# Patient Record
Sex: Male | Born: 1955
Health system: Southern US, Community
[De-identification: ages and names within clinical notes are randomized; demographics above are authoritative.]

## PROBLEM LIST (undated history)

## (undated) DIAGNOSIS — L0390 Cellulitis, unspecified: Secondary | ICD-10-CM

## (undated) DIAGNOSIS — I509 Heart failure, unspecified: Secondary | ICD-10-CM

## (undated) DIAGNOSIS — J449 Chronic obstructive pulmonary disease, unspecified: Secondary | ICD-10-CM

## (undated) DIAGNOSIS — D689 Coagulation defect, unspecified: Secondary | ICD-10-CM

## (undated) DIAGNOSIS — N280 Ischemia and infarction of kidney: Secondary | ICD-10-CM

## (undated) DIAGNOSIS — N189 Chronic kidney disease, unspecified: Secondary | ICD-10-CM

## (undated) DIAGNOSIS — H9190 Unspecified hearing loss, unspecified ear: Secondary | ICD-10-CM

## (undated) DIAGNOSIS — E7800 Pure hypercholesterolemia, unspecified: Secondary | ICD-10-CM

## (undated) DIAGNOSIS — Q231 Congenital insufficiency of aortic valve: Secondary | ICD-10-CM

## (undated) DIAGNOSIS — Q2381 Bicuspid aortic valve: Secondary | ICD-10-CM

## (undated) DIAGNOSIS — I729 Aneurysm of unspecified site: Secondary | ICD-10-CM

## (undated) DIAGNOSIS — I5022 Chronic systolic (congestive) heart failure: Secondary | ICD-10-CM

## (undated) DIAGNOSIS — G459 Transient cerebral ischemic attack, unspecified: Secondary | ICD-10-CM

## (undated) DIAGNOSIS — N183 Chronic kidney disease, stage 3 unspecified: Secondary | ICD-10-CM

## (undated) DIAGNOSIS — I10 Essential (primary) hypertension: Secondary | ICD-10-CM

## (undated) DIAGNOSIS — I639 Cerebral infarction, unspecified: Secondary | ICD-10-CM

## (undated) DIAGNOSIS — Z952 Presence of prosthetic heart valve: Secondary | ICD-10-CM

## (undated) HISTORY — DX: Chronic kidney disease, unspecified: N18.9

## (undated) HISTORY — DX: Chronic systolic (congestive) heart failure: I50.22

## (undated) HISTORY — PX: PACEMAKER IMPLANT: EP1218

## (undated) HISTORY — DX: Congenital insufficiency of aortic valve: Q23.1

## (undated) HISTORY — DX: Ischemia and infarction of kidney: N28.0

## (undated) HISTORY — PX: AORTIC VALVE REPLACEMENT: SHX41

## (undated) HISTORY — PX: AORTIC VALVE SURGERY: SHX549

## (undated) HISTORY — DX: Bicuspid aortic valve: Q23.81

## (undated) HISTORY — DX: Transient cerebral ischemic attack, unspecified: G45.9

## (undated) HISTORY — DX: Coagulation defect, unspecified: D68.9

## (undated) HISTORY — DX: Cerebral infarction, unspecified: I63.9

## (undated) HISTORY — DX: Cellulitis, unspecified: L03.90

---
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
## 1997-10-22 ENCOUNTER — Emergency Department (HOSPITAL_COMMUNITY): Admission: EM | Admit: 1997-10-22 | Discharge: 1997-10-22 | Payer: Self-pay | Admitting: Emergency Medicine

## 2003-07-04 HISTORY — PX: CARPAL TUNNEL RELEASE: SHX101

## 2007-07-04 HISTORY — PX: CARDIAC SURGERY: SHX584

## 2010-03-21 HISTORY — PX: CARDIAC CATHETERIZATION: SHX172

## 2010-03-25 DIAGNOSIS — Z952 Presence of prosthetic heart valve: Secondary | ICD-10-CM

## 2010-03-25 HISTORY — DX: Presence of prosthetic heart valve: Z95.2

## 2011-07-04 ENCOUNTER — Emergency Department (HOSPITAL_BASED_OUTPATIENT_CLINIC_OR_DEPARTMENT_OTHER)
Admission: EM | Admit: 2011-07-04 | Discharge: 2011-07-04 | Disposition: A | Discharge status: Home / Self Care | Payer: Self-pay | Attending: Emergency Medicine | Admitting: Emergency Medicine

## 2011-07-04 ENCOUNTER — Emergency Department (INDEPENDENT_AMBULATORY_CARE_PROVIDER_SITE_OTHER): Payer: Self-pay

## 2011-07-04 ENCOUNTER — Encounter: Payer: Self-pay | Admitting: *Deleted

## 2011-07-04 DIAGNOSIS — R05 Cough: Secondary | ICD-10-CM | POA: Insufficient documentation

## 2011-07-04 DIAGNOSIS — R509 Fever, unspecified: Secondary | ICD-10-CM | POA: Insufficient documentation

## 2011-07-04 DIAGNOSIS — Z79899 Other long term (current) drug therapy: Secondary | ICD-10-CM | POA: Insufficient documentation

## 2011-07-04 DIAGNOSIS — E7800 Pure hypercholesterolemia, unspecified: Secondary | ICD-10-CM | POA: Insufficient documentation

## 2011-07-04 DIAGNOSIS — R0989 Other specified symptoms and signs involving the circulatory and respiratory systems: Secondary | ICD-10-CM

## 2011-07-04 DIAGNOSIS — I10 Essential (primary) hypertension: Secondary | ICD-10-CM | POA: Insufficient documentation

## 2011-07-04 DIAGNOSIS — R059 Cough, unspecified: Secondary | ICD-10-CM | POA: Insufficient documentation

## 2011-07-04 DIAGNOSIS — J029 Acute pharyngitis, unspecified: Secondary | ICD-10-CM | POA: Insufficient documentation

## 2011-07-04 HISTORY — DX: Pure hypercholesterolemia, unspecified: E78.00

## 2011-07-04 MED ORDER — OXYCODONE-ACETAMINOPHEN 5-325 MG PO TABS
1.0000 | ORAL_TABLET | Freq: Once | ORAL | Status: AC
  Administered 2011-07-04: 1 via ORAL
  Filled 2011-07-04: qty 1

## 2011-07-04 MED ORDER — OXYCODONE-ACETAMINOPHEN 5-325 MG PO TABS: 1.0000 | ORAL_TABLET | ORAL | Status: AC | PRN

## 2011-07-04 MED ORDER — PENICILLIN V POTASSIUM 500 MG PO TABS: 500.0000 mg | ORAL_TABLET | Freq: Three times a day (TID) | ORAL | Status: AC

## 2011-07-04 NOTE — ED Notes (Signed)
Pt states saw cardiologist yesterday for recheck and got good report coumadin level within normal limits. Started getting cough chest congestion and severe sore throat with chills and fever yesterday.

## 2011-07-04 NOTE — ED Provider Notes (Signed)
History     CSN: [id number]  Arrival date & time 07/04/11  4098   First MD Initiated Contact with Patient 07/04/11 1027      Chief Complaint  Patient presents with  . Cough  . Fever  . Sore Throat    (Consider location/radiation/quality/duration/timing/severity/associated sxs/prior treatment) HPI  Ho aortic valve replacement on Coumadin, hypertension presents with multiple complaints. Patient states that yesterday he can contact with sick person on elevated. He states that shortly thereafter he began to experience 9 or 10 pain/sore throat. He is having nasal congestion and rhinorrhea. He is not having shortness of breath, chest pain. There is no neck swelling or tongue swelling. He is tolerating oral intake of fluid and solids but with pain. Denies rash. Subjective fever and chills.   ED Notes, ED Provider Notes from 07/04/11 0000 to 07/04/11 09:49:46       Melissa Ramer Lottie Rater, RN 07/04/2011 09:44      Pt states saw cardiologist yesterday for recheck and got good report coumadin level within normal limits. Started getting cough chest congestion and severe sore throat with chills and fever yesterday.     Past Medical History  Diagnosis Date  . Hypertension   . Hypercholesterolemia     Past Surgical History  Procedure Date  . Cardiac surgery     History reviewed. No pertinent family history.  History  Substance Use Topics  . Smoking status: Current Everyday Smoker  . Smokeless tobacco: Not on file  . Alcohol Use: No      Review of Systems  All other systems reviewed and are negative.   except as noted HPI  Allergies  Review of patient's allergies indicates no known allergies.  Home Medications   Current Outpatient Rx  Name Route Sig Dispense Refill  . ASPIRIN 81 MG PO CHEW Oral Chew 81 mg by mouth daily.      . FUROSEMIDE 40 MG PO TABS Oral Take 40 mg by mouth daily.      Marland Kitchen LISINOPRIL 10 MG PO TABS Oral Take 10 mg by mouth daily.      Marland Kitchen METOPROLOL  SUCCINATE ER 100 MG PO TB24 Oral Take 100 mg by mouth daily.      Marland Kitchen SIMVASTATIN 40 MG PO TABS Oral Take 40 mg by mouth at bedtime.      . WARFARIN SODIUM 1 MG PO TABS Oral Take by mouth as directed.      . OXYCODONE-ACETAMINOPHEN 5-325 MG PO TABS Oral Take 1 tablet by mouth every 4 (four) hours as needed for pain. 10 tablet 0  . PENICILLIN V POTASSIUM 500 MG PO TABS Oral Take 1 tablet (500 mg total) by mouth 3 (three) times daily. 30 tablet 0    BP 115/84  Pulse 68  Temp 99.3 F (37.4 C)  Resp 20  SpO2 100%  Physical Exam  Nursing note and vitals reviewed. Constitutional: He is oriented to person, place, and time. He appears well-developed and well-nourished. No distress.  HENT:  Head: Atraumatic.  Mouth/Throat: Oropharyngeal exudate present.       Posterior OP erythema and redness +exudates 2+ tonsillar swelling. Uvula midline No muffled voice or trismus  Eyes: Conjunctivae are normal. Pupils are equal, round, and reactive to light.  Neck: Neck supple.  Cardiovascular: Normal rate, regular rhythm, normal heart sounds and intact distal pulses.  Exam reveals no gallop and no friction rub.   No murmur heard. Pulmonary/Chest: Effort normal. No respiratory distress. He has no wheezes. He  has no rales.  Abdominal: Soft. Bowel sounds are normal. There is no tenderness. There is no rebound and no guarding.  Musculoskeletal: Normal range of motion. He exhibits no edema and no tenderness.  Neurological: He is alert and oriented to person, place, and time.  Skin: Skin is warm and dry.  Psychiatric: He has a normal mood and affect.    ED Course  Procedures (including critical care time)   Labs Reviewed  RAPID STREP SCREEN  STREP A DNA PROBE   Dg Chest 2 View  07/04/2011  *RADIOLOGY REPORT*  Clinical Data: Cough, congestion and fever.  CHEST - 2 VIEW  Comparison: None.  Findings: The heart size is normal status post median sternotomy and aortic valve replacement.  The lungs are clear.   There is no pleural effusion or pneumothorax.  No acute osseous findings are identified.  Telemetry leads overlie the chest.  IMPRESSION: No active cardiopulmonary process.  Original Report Authenticated By: Gerrianne Scale, M.D.     1. Pharyngitis     MDM  Exudative pharyngitis. His rapid strep is negative. Given the 90% sensitivity in the patient with history of aortic valve replacement will cover with penicillin streptococcal pharyngitis. The sample was sent for culture as well. Pain control, followup with his primary care. Given strict precautions for return to the emergency department.        Forbes Cellar, MD 07/04/11 [phone]

## 2011-07-05 LAB — STREP A DNA PROBE

## 2014-04-29 LAB — CBC
HCT: 42.7 % (ref 40.0–52.0)
HGB: 14.2 g/dL (ref 13.0–18.0)
MCH: 29.5 pg (ref 26.0–34.0)
MCHC: 33.2 g/dL (ref 32.0–36.0)
MCV: 89 fL (ref 80–100)
PLATELETS: 257 10*3/uL (ref 150–440)
RBC: 4.81 10*6/uL (ref 4.40–5.90)
RDW: 13.9 % (ref 11.5–14.5)
WBC: 15.2 10*3/uL — AB (ref 3.8–10.6)

## 2014-04-29 LAB — BASIC METABOLIC PANEL
Anion Gap: 7 (ref 7–16)
BUN: 13 mg/dL (ref 7–18)
CO2: 26 mmol/L (ref 21–32)
Calcium, Total: 8.7 mg/dL (ref 8.5–10.1)
Chloride: 106 mmol/L (ref 98–107)
Creatinine: 0.94 mg/dL (ref 0.60–1.30)
GLUCOSE: 112 mg/dL — AB (ref 65–99)
Osmolality: 278 (ref 275–301)
POTASSIUM: 3.9 mmol/L (ref 3.5–5.1)
SODIUM: 139 mmol/L (ref 136–145)

## 2014-04-29 LAB — PROTIME-INR
INR: 1.5
Prothrombin Time: 18.1 secs — ABNORMAL HIGH (ref 11.5–14.7)

## 2014-04-30 ENCOUNTER — Inpatient Hospital Stay: Payer: Self-pay | Admitting: Internal Medicine

## 2014-05-01 LAB — COMPREHENSIVE METABOLIC PANEL
ALBUMIN: 2.8 g/dL — AB (ref 3.4–5.0)
ALK PHOS: 59 U/L
Anion Gap: 6 — ABNORMAL LOW (ref 7–16)
BUN: 15 mg/dL (ref 7–18)
Bilirubin,Total: 0.5 mg/dL (ref 0.2–1.0)
CO2: 29 mmol/L (ref 21–32)
CREATININE: 0.92 mg/dL (ref 0.60–1.30)
Calcium, Total: 8.2 mg/dL — ABNORMAL LOW (ref 8.5–10.1)
Chloride: 104 mmol/L (ref 98–107)
EGFR (African American): >60
EGFR (Non-African Amer.): >60
GLUCOSE: 110 mg/dL — AB (ref 65–99)
OSMOLALITY: 279 (ref 275–301)
Potassium: 3.8 mmol/L (ref 3.5–5.1)
SGOT(AST): 18 U/L (ref 15–37)
SGPT (ALT): 18 U/L
SODIUM: 139 mmol/L (ref 136–145)
TOTAL PROTEIN: 7.6 g/dL (ref 6.4–8.2)

## 2014-05-01 LAB — CBC WITH DIFFERENTIAL/PLATELET
BASOS ABS: 0.1 10*3/uL (ref 0.0–0.1)
BASOS PCT: 0.6 %
EOS PCT: 2.4 %
Eosinophil #: 0.2 10*3/uL (ref 0.0–0.7)
HCT: 40.9 % (ref 40.0–52.0)
HGB: 13.9 g/dL (ref 13.0–18.0)
LYMPHS ABS: 1.6 10*3/uL (ref 1.0–3.6)
Lymphocyte %: 16.2 %
MCH: 30.4 pg (ref 26.0–34.0)
MCHC: 34.1 g/dL (ref 32.0–36.0)
MCV: 89 fL (ref 80–100)
MONO ABS: 1.2 x10 3/mm — AB (ref 0.2–1.0)
Monocyte %: 12.1 %
NEUTROS ABS: 6.7 10*3/uL — AB (ref 1.4–6.5)
NEUTROS PCT: 68.7 %
PLATELETS: 249 10*3/uL (ref 150–440)
RBC: 4.59 10*6/uL (ref 4.40–5.90)
RDW: 13.9 % (ref 11.5–14.5)
WBC: 9.8 10*3/uL (ref 3.8–10.6)

## 2014-05-01 LAB — HEMOGLOBIN A1C: HEMOGLOBIN A1C: 6.1 % (ref 4.2–6.3)

## 2014-05-01 LAB — PROTIME-INR
INR: 1.9
PROTHROMBIN TIME: 21.3 s — AB (ref 11.5–14.7)

## 2014-05-01 LAB — VANCOMYCIN, TROUGH: Vancomycin, Trough: 11 ug/mL (ref 10–20)

## 2014-05-02 LAB — PROTIME-INR
INR: 2.3
Prothrombin Time: 24.3 secs — ABNORMAL HIGH (ref 11.5–14.7)

## 2014-05-03 DIAGNOSIS — G459 Transient cerebral ischemic attack, unspecified: Secondary | ICD-10-CM

## 2014-05-03 HISTORY — DX: Transient cerebral ischemic attack, unspecified: G45.9

## 2014-05-03 LAB — PROTIME-INR
INR: 2.6
Prothrombin Time: 26.8 secs — ABNORMAL HIGH (ref 11.5–14.7)

## 2014-05-05 LAB — CULTURE, BLOOD (SINGLE)

## 2014-05-05 LAB — WOUND CULTURE

## 2014-05-24 ENCOUNTER — Observation Stay: Payer: Self-pay | Admitting: Internal Medicine

## 2014-05-24 DIAGNOSIS — R4781 Slurred speech: Secondary | ICD-10-CM | POA: Diagnosis not present

## 2014-05-24 DIAGNOSIS — L0390 Cellulitis, unspecified: Secondary | ICD-10-CM | POA: Diagnosis not present

## 2014-05-24 DIAGNOSIS — I361 Nonrheumatic tricuspid (valve) insufficiency: Secondary | ICD-10-CM

## 2014-05-24 DIAGNOSIS — E162 Hypoglycemia, unspecified: Secondary | ICD-10-CM | POA: Diagnosis not present

## 2014-05-24 LAB — CBC WITH DIFFERENTIAL/PLATELET
BASOS ABS: 0.1 10*3/uL (ref 0.0–0.1)
BASOS PCT: 0.6 %
EOS ABS: 0.3 10*3/uL (ref 0.0–0.7)
Eosinophil %: 2.8 %
HCT: 42.5 % (ref 40.0–52.0)
HGB: 14.1 g/dL (ref 13.0–18.0)
Lymphocyte #: 2.3 10*3/uL (ref 1.0–3.6)
Lymphocyte %: 21.2 %
MCH: 29.1 pg (ref 26.0–34.0)
MCHC: 33.1 g/dL (ref 32.0–36.0)
MCV: 88 fL (ref 80–100)
MONO ABS: 1.2 x10 3/mm — AB (ref 0.2–1.0)
Monocyte %: 10.8 %
Neutrophil #: 7.1 10*3/uL — ABNORMAL HIGH (ref 1.4–6.5)
Neutrophil %: 64.6 %
PLATELETS: 265 10*3/uL (ref 150–440)
RBC: 4.84 10*6/uL (ref 4.40–5.90)
RDW: 13.9 % (ref 11.5–14.5)
WBC: 11 10*3/uL — AB (ref 3.8–10.6)

## 2014-05-24 LAB — TROPONIN I: TROPONIN-I: 0.03 ng/mL

## 2014-05-24 LAB — COMPREHENSIVE METABOLIC PANEL
ALBUMIN: 3 g/dL — AB (ref 3.4–5.0)
ALT: 20 U/L
AST: 22 U/L (ref 15–37)
Alkaline Phosphatase: 69 U/L
Anion Gap: 8 (ref 7–16)
BUN: 14 mg/dL (ref 7–18)
Bilirubin,Total: 0.5 mg/dL (ref 0.2–1.0)
CO2: 27 mmol/L (ref 21–32)
CREATININE: 0.93 mg/dL (ref 0.60–1.30)
Calcium, Total: 8.5 mg/dL (ref 8.5–10.1)
Chloride: 105 mmol/L (ref 98–107)
EGFR (African American): >60
GLUCOSE: 86 mg/dL (ref 65–99)
OSMOLALITY: 279 (ref 275–301)
Potassium: 3.8 mmol/L (ref 3.5–5.1)
SODIUM: 140 mmol/L (ref 136–145)
TOTAL PROTEIN: 8.1 g/dL (ref 6.4–8.2)

## 2014-05-24 LAB — URINALYSIS, COMPLETE
BACTERIA: NONE SEEN
Bilirubin,UR: NEGATIVE
Blood: NEGATIVE
Glucose,UR: NEGATIVE mg/dL (ref 0–75)
Ketone: NEGATIVE
LEUKOCYTE ESTERASE: NEGATIVE
NITRITE: NEGATIVE
PH: 5 (ref 4.5–8.0)
PROTEIN: NEGATIVE
RBC,UR: NONE SEEN /HPF (ref 0–5)
SPECIFIC GRAVITY: 1.005 (ref 1.003–1.030)
SQUAMOUS EPITHELIAL: NONE SEEN
WBC UR: NONE SEEN /HPF (ref 0–5)

## 2014-05-24 LAB — APTT: ACTIVATED PTT: 35.9 s (ref 23.6–35.9)

## 2014-05-24 LAB — PROTIME-INR
INR: 1.9
PROTHROMBIN TIME: 21 s — AB (ref 11.5–14.7)

## 2014-05-24 LAB — URIC ACID: Uric Acid: 7 mg/dL (ref 3.5–7.2)

## 2014-05-25 LAB — BASIC METABOLIC PANEL
ANION GAP: 6 — AB (ref 7–16)
BUN: 15 mg/dL (ref 7–18)
CALCIUM: 8.5 mg/dL (ref 8.5–10.1)
CHLORIDE: 106 mmol/L (ref 98–107)
CO2: 28 mmol/L (ref 21–32)
CREATININE: 0.85 mg/dL (ref 0.60–1.30)
EGFR (African American): >60
Glucose: 101 mg/dL — ABNORMAL HIGH (ref 65–99)
Osmolality: 280 (ref 275–301)
POTASSIUM: 3.6 mmol/L (ref 3.5–5.1)
Sodium: 140 mmol/L (ref 136–145)

## 2014-05-25 LAB — CBC WITH DIFFERENTIAL/PLATELET
BASOS ABS: 0.1 10*3/uL (ref 0.0–0.1)
BASOS PCT: 0.9 %
EOS PCT: 4.1 %
Eosinophil #: 0.3 10*3/uL (ref 0.0–0.7)
HCT: 41.6 % (ref 40.0–52.0)
HGB: 13.5 g/dL (ref 13.0–18.0)
LYMPHS PCT: 24.5 %
Lymphocyte #: 2 10*3/uL (ref 1.0–3.6)
MCH: 28.5 pg (ref 26.0–34.0)
MCHC: 32.4 g/dL (ref 32.0–36.0)
MCV: 88 fL (ref 80–100)
Monocyte #: 1 x10 3/mm (ref 0.2–1.0)
Monocyte %: 11.8 %
Neutrophil #: 4.9 10*3/uL (ref 1.4–6.5)
Neutrophil %: 58.7 %
Platelet: 259 10*3/uL (ref 150–440)
RBC: 4.72 10*6/uL (ref 4.40–5.90)
RDW: 13.9 % (ref 11.5–14.5)
WBC: 8.3 10*3/uL (ref 3.8–10.6)

## 2014-05-25 LAB — PROTIME-INR
INR: 1.9
PROTHROMBIN TIME: 21.3 s — AB (ref 11.5–14.7)

## 2014-05-25 LAB — LIPID PANEL
CHOLESTEROL: 131 mg/dL (ref 0–200)
HDL: 30 mg/dL — AB (ref 40–60)
LDL CHOLESTEROL, CALC: 68 mg/dL (ref 0–100)
Triglycerides: 167 mg/dL (ref 0–200)
VLDL Cholesterol, Calc: 33 mg/dL (ref 5–40)

## 2014-05-25 LAB — TSH: Thyroid Stimulating Horm: 1.81 u[IU]/mL

## 2014-10-24 NOTE — Discharge Summary (Signed)
PATIENT NAME:  Roberto [name], Roberto [name] MR#:  [id number] DATE OF BIRTH:  01/14/1956  DATE OF ADMISSION:  05/24/2014 DATE OF DISCHARGE:  05/25/2014  ADMITTING PHYSICIAN: Dr. Dustin Flock.    DISCHARGING PHYSICIAN:  Dr. Gladstone Lighter.   PRIMARY CARE PHYSICIAN: Currently none.   [place]: None.   DISCHARGE DIAGNOSES:  1.  Transient ischemic contact with left-sided weakness which is resolved.  2.  Hypertension.  3.  Mechanic metallic aortic valve replacement surgery on Coumadin.  4.  Recent right foot cellulitis with residual foot pain.    DISCHARGE HOME MEDICATIONS:  1. Lisinopril 2.5 mg p.o. daily.  2. Simvastatin 40 mg p.o. at bedtime.  3. Aspirin 81 mg p.o. daily.  4. Metoprolol 25 mg p.o. b.i.d.  5. Lasix 20 mg p.o. daily.  6. Coumadin 10 mg p.o. daily.  7. Oxycodone 5 mg 1 tablet q. 6 hours p.r.n. for severe pain of the foot.   DISCHARGE DIET: Low-sodium diet.   DISCHARGE ACTIVITY: As tolerated.    FOLLOWUP INSTRUCTIONS:  1.  PCP follow-up in 1-2 weeks, please advise to give patient information on local physicians who can take Medicare.  2.  Coumadin clinic followup in 5 days. The patient goes to [facility] Coumadin clinic. 3.  Cardiology followup as prior scheduled with his own cardiologist.    LABORATORIES AND IMAGING STUDIES PRIOR TO DISCHARGE:  1.  WBC 8.3, hemoglobin 13.5 hematocrit 41.6, platelet count 259,000.    2.  Sodium 140, potassium 3.6, chloride 106, bicarbonate 28, BUN 15, creatinine 0.8, blood glucose 101, and calcium of 8.5.  3.  LDL 68, HDL 30, total cholesterol 131, triglycerides 167.  4.  TSH 1.8.  5.  INR is 1.9.  6.  Echo Doppler showing no evidence of any TIA or CVA source.  EF is 50%-55%, mild LVH noted.   7.  Ultrasound carotid Doppler showing no evidence of any hemodynamically significant stenosis.  8.  Chest x-ray showing no active disease, minimal bibasilar atelectasis and scarring, but no other acute disease.   BRIEF HOSPITAL  COURSE: Roberto [name] is a 59 year old male with past medical history significant for mechanical aortic valve replacement surgery, on Coumadin, hypertension, comes to the hospital secondary to transient episode of left-sided weakness.   1.  TIA. Possible TIA with transient left-sided weakness which resolved by the time he came to the hospital, could have been as a result of his INR being subtherapeutic while on mechanical aortic valve.  CT head did not show any acute findings at all. His Coumadin dose has been increased. He was monitored for neurologic checks in the hospital. Echo did not show any source of CVA or TIA at this time. Carotid Doppler did not show any stenosis.  He is being discharged home with followup INR in 3 days.  2.  Metallic aortic valve replacement surgery on Coumadin. INR is subtherapeutic, dose increased and follow up as an outpatient.  He is on aspirin, metoprolol, statin, and lisinopril and Lasix. Not sure if he has underlying CAD or not he, will follow up with his cardiologist as an outpatient.  3.  Recent cellulitis about a month ago for the right foot, swelling is slightly there, no tenderness or erythema present. Finished antibiotics. Oxycodone p.r.n. for pain. His course has been otherwise uneventful in the hospital.   DISCHARGE CONDITION: Stable.   DISCHARGE DISPOSITION: Home.   TIME SPENT ON DISCHARGE: 40 minutes.    ____________________________ Gladstone Lighter, MD rk:bu D: 05/25/2014 16:43:16 ET  T: 05/25/2014 17:20:29 ET JOB#: [id number]  cc: Gladstone Lighter, MD, <Dictator> Gladstone Lighter MD ELECTRONICALLY SIGNED 06/19/2014 14:01

## 2014-10-24 NOTE — H&P (Signed)
PATIENT NAME:  Roberto [name], Roberto [name] MR#:  [id number] DATE OF BIRTH:  [birth year]  DATE OF ADMISSION:  05/24/2014  PRIMARY CARE PROVIDER: None. He reports that he does not have any primary doctor.  EMERGENCY DEPARTMENT REFERRING PHYSICIAN: Dr. (Dictation Anomaly)    CHIEF COMPLAINT: Slurred speech, left upper extremity and left lower extremity weakness.   HISTORY OF PRESENT ILLNESS: The patient is a 59 year old, Caucasian male with medical history significant for aortic valve replacement with mechanical valve, who also continues to smoke, who was hospitalized here actually on 04/30/2014 with right lower extremity cellulitis, who continues to have issues with swelling in the legs, but the erythema has resolved, who earlier this morning around 12 p.m. developed left-sided weakness in the upper and lower extremity, as well as slurred speech. The patient was brought to the Emergency Room and had evaluation including a CT scan of the head, which showed mild cortical atrophy and no other abnormality was noted. The patient's symptoms have resolved. He is on chronic Coumadin and his INR is subtherapeutic at 1.9. The patient otherwise denies any fevers or chills. No chest pains. No palpitations. No shortness of breath. Complains of bilateral lower extremity swelling. He complains of pain on his left foot, as well. Denies any fevers or chills. No nausea, vomiting or diarrhea.   PAST MEDICAL HISTORY: 1. Significant for history of aortic valve replacement with mechanical valve that was done approximately 6 years ago, on chronic anticoagulation as a result.  2. Hyperlipidemia.  3. Hypertension, although the patient denies he has a high blood pressure, but he is on lisinopril and metoprolol. States that these are being used for his aortic valve replacement.   PAST SURGICAL HISTORY: Mechanical valve replacement done about 6 years ago in [place].   ALLERGIES: None.   MEDICATIONS: According to his last discharge,  oxycodone 5 mg 1 tablet p.o. q.4 h. p.r.n. Warfarin 7.5 mg 1 tablet followed by a half-tablet of 7.5, followed by another half-tablet, then he takes the 1 tablet of 7.5 mg, and he continues to repeat this. His anticoagulation is followed by the [facility]madin Clinic. Metoprolol 25 b.i.d., Lasix 20 daily, simvastatin 40 daily, lisinopril 2.5 p.o. daily, aspirin 81 mg 1 tablet p.o. daily.   SOCIAL HISTORY: He is a Administrator by profession. He continues to smoke about 2 packs per day. Denies alcohol or substance abuse.   FAMILY HISTORY: Significant for cancer in father, mother, brother and sister.   REVIEW OF SYSTEMS:  CONSTITUTIONAL: Denies any fevers, fatigue or weakness. No weight loss or weight gain.  EYES: No blurred or double vision. No redness. No inflammation. No glaucoma. No cataracts.  ENT: No tinnitus. No ear pain. No hearing loss. No allergies, seasonal or year-round allergies. No epistaxis. No nasal discharge. No difficulty swallowing.  RESPIRATORY: Denies any cough, wheezing, hemoptysis. No COPD, no TB.  CARDIOVASCULAR: Denies any chest pain or orthopnea. Complains of bilateral lower extremity edema. No arrhythmia.  GASTROINTESTINAL: No nausea, vomiting, diarrhea. No abdominal pain. No hematemesis. No melena. No ulcer. No guarding. No IBS. No jaundice.  GENITOURINARY: Denies any dysuria, hematuria, renal calculi or frequency.  ENDOCRINE: Denies any polyuria, nocturia, or thyroid problems. HEME AND LYMPHATIC: Denies anemia, easy bruisability or bleeding.  SKIN: No acne. No rash.  MUSCULOSKELETAL: Pain in the foot. No gout.  NEUROLOGIC: No numbness, CVA, TIA or seizures.  PSYCHIATRIC: No anxiety, insomnia or ADD.   PHYSICAL EXAMINATION:  VITAL SIGNS: Temperature 98.5, pulse 83, respirations 20, blood pressure 122/77, O2 of  98%.  GENERAL: The patient is an obese male, appears in no acute distress.  HEENT: Head atraumatic, normocephalic. Pupils equally round, reactive to light and  accommodation. There is no conjunctival pallor. No scleral icterus. Nasal examination shows no drainage or ulceration. Oropharynx is clear without any exudate.  NECK: Supple without any thyromegaly.  CARDIOVASCULAR: Regular rate and rhythm. No murmurs, rubs, clicks or gallops.  LUNGS: Clear to auscultation bilaterally without any rales, rhonchi or wheezing.  ABDOMEN: Soft, nontender, nondistended. Positive bowel sounds x 4.  EXTREMITIES: He has some swelling in both lower extremities, nonpitting edema, and there is no significant erythema or warmth.  LYMPH NODES: Nonpalpable.  VASCULAR: Good DP, PT pulses.  PSYCHIATRIC: Not anxious or depressed.  NEUROLOGIC: Awake, alert, and oriented x 3. No focal deficits. Cranial nerves II through XII grossly intact. Strength 5/5 in all 4 extremities. Reflexes 2+. Babinski is downgoing.  LABORATORY DATA: Evaluations in the Emergency Room, glucose 86, BUN 14, creatinine 0.93, sodium 140, potassium 3.8, chloride 105, CO2 of 27, calcium 8.5. LFTs are normal except albumin of 3.0. Troponin 0.03. WBC 11, hemoglobin 14.1, platelet count 265,000. INR 1.9.   Urinalysis: Nitrites negative, leukocytes negative.   CT scan of the head without contrast shows mild cortical atrophy, otherwise normal.   ASSESSMENT AND PLAN: The patient is a 59 year old, white male white male, with history of recent admission for cellulitis on chronic anticoagulation with warfarin for aortic valve replacement, who presents with brief episode of slurred speech, left-sided weakness, now resolved.   1. Transient ischemic attack with subtherapeutic INR. We will give him a Coumadin dose 9 mg tonight, follow his INR. We will continue aspirin, get an echocardiogram. We will place him on telemetry. Check carotid Dopplers.  2. Leg swelling. Differential diagnosis includes possible venous insufficiency, left arterial insufficiency. Also, I need to make sure he does not have underlying gout. We will check a uric  acid level. He will need an outpatient vascular evaluation to evaluate for circulation-related issues. 3. History of aortic valve replacement. INR should be in the 2.5 to 3.5 range. His Coumadin needs to be increased on discharge from his regular regimen.  4. Hypertension. Continue metoprolol and lisinopril. 5. Nicotine addiction. Smoking cessation done, 4 minutes spent. Strongly recommended that the patient stop smoking. I offered him nicotine. I also offered him other alternative oral therapies. The patient reports that he has tried all and they have not helped. He is going to try to quit on his own.   TIME SPENT: 55 minutes.     ____________________________ Lafonda Mosses Posey Pronto, MD shp:JT D: 05/24/2014 14:29:08 ET T: 05/24/2014 15:18:13 ET JOB#: [id number]  cc: Skyelar Halliday H. Posey Pronto, MD, <Dictator> Alric Seton MD ELECTRONICALLY SIGNED 05/30/2014 12:55

## 2014-10-24 NOTE — Discharge Summary (Signed)
PATIENT NAME:  Roberto [name], Roberto [name] MR#:  [id number] DATE OF BIRTH:  Apr 16, 1956  DATE OF ADMISSION:  04/30/2014 DATE OF DISCHARGE:  05/03/2014  PRESENTING COMPLAINT: Right lower extremity pain, swelling, and redness.   DISCHARGE DIAGNOSES: Right lower extremity cellulitis.   CODE STATUS: Full code.   MEDICATIONS: 1.  Augmentin 875 b.i.d.  2.  Oxycodone 5 mg 1 tablet every 4 hours as needed.  3.  Warfarin 7.5 mg p.o. daily.  4.  Metoprolol 25 mg b.i.d.  5.  Lasix 20 mg daily.  6.  Simvastatin 40 mg at bedtime.  7.  Lisinopril 2.5 mg daily.  8.  Aspirin 81 mg daily.   DISCHARGE INSTRUCTIONS: Follow up with your primary care physician at [facility]. Use crutches as instructed for support while walking. Your PT/INR needs to be checked by a PCP at least by Tuesday or Wednesday of next week.   LABORATORY DATA: Wound culture negative in 4 days. White count is 9.8, creatinine is 0.9. PT/INR is 21.3 and 1.9. PT/INR on discharge was 26.8 and 2.9. PT was 26.8 and 2.6. INR was 2.6 on discharge. Blood cultures negative in 5 days. Ultrasound of lower extremities shows no evidence of DVT on the right lower extremity.   BRIEF SUMMARY OF HOSPITAL COURSE: [name] is a 59 year old Caucasian gentleman with history of hypertension, hyperlipidemia, metallic aortic valve replacement on Coumadin with history of obesity comes in with:  1.  Right lower extremity cellulitis. He was on IV Zosyn, vancomycin, clinically improved, changed to p.o. Augmentin and redness improved. Still has some pain. P.r.n. oxycodone was prescribed. He remained afebrile. Blood cultures were negative. Doppler negative for DVT. Wound cultures remained negative.  2.  History of aortic valve replacement. INR therapeutic at discharge. He will follow up with his primary care physician for further PT/INR checks since he is being on antibiotics. This was advised to the patient and he did voice understanding.  3.  Hypertension. Continue home  medications.  4.  Hyperlipidemia. Continue simvastatin. The patient's vitals on discharge were 98.1, pulse is 91, blood pressure 122/77, saturations 94% on room air.   CODE STATUS: The patient remained a full code.   TIME SPENT: 40 minutes.    ____________________________ Hart Rochester Posey Pronto, MD sap:at D: 05/09/2014 16:39:36 ET T: 05/09/2014 17:33:16 ET JOB#: [id number]  cc: Fletcher Rathbun A. Posey Pronto, MD, <Dictator> Ilda Basset MD ELECTRONICALLY SIGNED 05/21/2014 11:48

## 2014-10-24 NOTE — H&P (Signed)
PATIENT NAME:  Roberto [name], Roberto [name] MR#:  [id number] DATE OF BIRTH:  1956-04-26  DATE OF ADMISSION:  04/30/2014  REFERRING PHYSICIAN:  Valli Glance. Owens Shark, MD  PRIMARY CARE PHYSICIAN:  Nonlocal in [place]  CARDIOLOGIST:  In [place], name not known  ADMITTING PHYSICIAN:  Juluis Mire, MD  CHIEF COMPLAINT:  Right lower extremity pain, swelling, and redness for past 6 days.   HISTORY OF PRESENT ILLNESS:  This is a 59 year old Caucasian male with a past medical history significant for aortic valve replacement with a mechanical valve about 6 years ago, on chronic anticoagulation, being followed at Coumadin clinic in [place], history of hyperlipidemia, who presents to the Emergency Room with the complaints of pain, redness, and swelling of the right lower extremity for the past 6 days. The patient states that he was in his usual state of health until 6 days ago, when he noticed swelling of the right lower extremity associated with some redness and pain, which gradually worsened, and the pain became so intense today, hence came to the Emergency Room for further evaluation. The patient denies any fever. No chills. No chest pain. No shortness of breath. No dizziness. No loss of consciousness. No nausea. No vomiting. No diarrhea. No dysuria. No hematuria. No prior similar episodes in the past. The patient was evaluated by the ED physician in the Emergency Room and was found to have extensive redness with cellulitis of the right lower extremity. Workup revealed elevated white blood cell count with normal vital signs and DVT studies of the right lower extremity negative for any acute DVT. He received some IV morphine for the pain control and was started on IV antibiotics Zosyn and vancomycin. Hospitalist service was consulted for further evaluation and management.   The patient states that he still has the right lower extremity pain, but it is getting better after he received morphine in the Emergency  Room. The patient was in [facility] before and moved to [place] about 2 years ago but did not get established with any local doctor. He is being followed by his cardiologist in [facility] every year and states he had seen his cardiologist last November and had an echocardiogram. He uses Coumadin and aspirin and is being followed by Coumadin clinic in [facility], and his last INR was 3.5 about 2-1/2 to 3 weeks ago per patient.   PAST MEDICAL HISTORY:  1.  Aortic valve replacement with a mechanical valve that was done about 6 years ago.  2.  Hyperlipidemia.  3.  Chronic anticoagulation, being followed by Coumadin clinic in [facility].   PAST SURGICAL HISTORY:  Mechanical aortic valve replacement done about 6 years ago in [place].   ALLERGIES:  No known drug allergies.   HOME MEDICATIONS: 1.  Warfarin 7.5 mg tablet half tablet 1 one day and half tablet on the second day followed by 1 tablet on the third day.  2.  Lisinopril, dose not known.  3.  Furosemide, dose not known.  4.  Simvastatin, dose not known.  5.  Metoprolol, dose not known.   SOCIAL HISTORY:  Moved to [place] about 2 years ago. He is a Administrator by profession. He is single. History of smoking about 2 packs per day for the past many years. Denies alcohol or substance abuse.   FAMILY HISTORY:  Significant for cancer with father, mother, brother, and sister.   REVIEW OF SYSTEMS: CONSTITUTIONAL: Negative for fever, fatigue, weakness, or excessive weight gain or weight  loss recently.  EYES:  Negative for blurred vision or double vision. No pain. No redness. No inflammation.  EARS, NOSE, AND THROAT:  Negative for tinnitus, ear pain, hearing loss, epistaxis, nasal discharge, or difficulty swallowing.  RESPIRATORY:  Negative for cough, wheezing, hemoptysis, dyspnea, or painful respirations.  CARDIOVASCULAR:  Negative for chest pain, palpitations, dizziness, syncope, orthopnea, or paroxysmal nocturnal dyspnea. He does have some  chronic pedal edema for which he takes furosemide on a regular basis.   GASTROINTESTINAL:  Negative for nausea, vomiting, diarrhea, abdominal pain, hematemesis, melena, GERD symptoms, or rectal bleeding.   GENITOURINARY:  Negative for dysuria, hematuria, frequency, or urgency.  ENDOCRINE:  Negative for polyuria or polydipsia. No heat or cold intolerance.  HEMATOLOGIC AND LYMPHATIC:  Negative for anemia or easy bruising or bleeding or swollen glands.   INTEGUMENTARY:  Significant for right lower extremity swelling, redness, and pain involving from the foot up to the thigh region. No arthritis.  NEUROLOGICAL:  Negative for focal weakness or numbness. No history of CVA, TIA, or seizure disorder.  PSYCHIATRIC:  Negative for anxiety, depression, or sleep disorder.   PHYSICAL EXAMINATION: VITAL SIGNS:  Temperature 98.6, pulse rate 87 per minute, respirations 22 per minute on arrival and now 18 per minute, blood pressure 129/63, oxygen saturation 96% on room air.  GENERAL:  Well-developed, well-nourished, obese male, pleasant and cooperative, alert and in no acute distress. He states pain has improving control with morphine.  HEAD:  Atraumatic, normocephalic.  EYES:  Pupils are equally reactive to light and accommodation. No conjunctival pallor. No scleral icterus. Extraocular movements are intact.  NOSE:  No nasal lesions. No drainage.  EARS:  No drainage. No external lesions.  ORAL CAVITY:  No mucosal lesions. No exudates.  NECK:  Supple. No JVD. No thyromegaly. No carotid bruit. Range of motion is normal.  RESPIRATORY:  Good respiratory effort. Not using any accessory muscles of respiration. Bilateral vesicular breath sounds present. No rales or rhonchi present.  CARDIOVASCULAR:  S1 and S2, regular. No murmurs. Click heard in the aortic area. Pulses:  Carotid and femoral are normal. Dorsalis pedis pulses on the right lower extremity not palpable, but Doppler is positive. He does have some peripheral  edema bilaterally, 1+ on the left lower extremity, and the right lower extremity is swollen with redness and tenderness.  GASTROINTESTINAL:  Abdomen is soft, obese, and nontender. No guarding. No rigidity. No hepatosplenomegaly. Bowel sounds present and equal in all 4 quadrants.  GENITOURINARY:  Deferred.  MUSCULOSKELETAL:  Range of motion and strength and tone equal bilaterally.  SKIN:  Redness with swelling and tenderness of the right lower extremity starting from the right foot up to the right thigh. Pedal edema is present.  LYMPHATIC:  Negative for cervical lymphadenopathy.  VASCULAR:  Poor dorsalis pedis pulses on the right lower extremity because of edema.  NEUROLOGICAL:  Cranial nerves II through XII are grossly intact. DTRs are 2+ bilaterally and symmetrical. No focal deficit. Strength is 5/5 in all extremities.  PSYCHIATRIC:  Judgment and insight are adequate. Alert and oriented x 3. Memory and mood are within normal limits.   LABORATORY DATA:  Serum glucose 112, BUN 13, creatinine 0.94, serum sodium 139, potassium 3.9, chloride 106, bicarbonate 26, total calcium 8.7. WBC 15.2, hemoglobin 14.2, hematocrit 42.7, platelet count 257. Prothrombin time 18.1, INR 1.5.   IMAGING:  Ultrasound Doppler of the right lower extremity:  No evidence of deep vein thrombosis within the visualized veins of the right lower extremity.  ASSESSMENT AND PLAN:  This is a 59 year old Caucasian male with a past medical history significant for mechanical aortic valve replacement on chronic anticoagulation and history of hyperlipidemia, who presents to the Emergency Room with the complaints of increasing swelling, pain, and redness of the right lower extremity of 6 days' duration.    1.  Right lower extremity cellulitis. Plan:  Admit to med-surge floor. Blood cultures, IV antibiotics vancomycin and Zosyn, elevation of right lower extremity, pain control measures.  2.  Status post aortic valve replacement with  mechanical valve for aortic stenosis, which was done about 6 years ago. The patient is on chronic anticoagulation and being followed at Coumadin clinic in [facility]. INR is subtherapeutic. Plan:  Give warfarin 10 mg tonight, check protime in the morning, and adjust dose accordingly.  3.  Elevated blood sugars. No history of diabetes mellitus. Check A1c and follow up accordingly.  4.  Active smoker. Counseled to quit smoking. Offered nicotine replacement therapy. The patient is undecided at this time.  5.  Hyperlipidemia on simvastatin, dose not known. Plan:  Get home medications and start home medications.    CODE STATUS:  Full code.   TIME SPENT:  55 minutes.     ____________________________ Juluis Mire, MD enr:nb D: 04/30/2014 02:21:52 ET T: 04/30/2014 02:47:53 ET JOB#: [id number]  cc: Juluis Mire, MD, <Dictator> Unknown cc  Juluis Mire MD ELECTRONICALLY SIGNED 05/29/2014 19:49

## 2014-12-15 ENCOUNTER — Other Ambulatory Visit: Payer: Self-pay | Admitting: Unknown Physician Specialty

## 2014-12-16 NOTE — Telephone Encounter (Signed)
Routing to primary 

## 2014-12-18 ENCOUNTER — Ambulatory Visit (INDEPENDENT_AMBULATORY_CARE_PROVIDER_SITE_OTHER): Payer: Self-pay | Admitting: Unknown Physician Specialty

## 2014-12-18 ENCOUNTER — Encounter: Payer: Self-pay | Admitting: Unknown Physician Specialty

## 2014-12-18 VITALS — BP 126/78 | HR 64 | Temp 98.8°F | Ht 65.5 in | Wt 321.6 lb

## 2014-12-18 DIAGNOSIS — T8209XD Other mechanical complication of heart valve prosthesis, subsequent encounter: Secondary | ICD-10-CM

## 2014-12-18 DIAGNOSIS — F17200 Nicotine dependence, unspecified, uncomplicated: Secondary | ICD-10-CM | POA: Insufficient documentation

## 2014-12-18 LAB — COAGUCHEK XS/INR WAIVED
INR: 2.3 — AB (ref 0.9–1.1)
PROTHROMBIN TIME: 27.8 s

## 2014-12-18 NOTE — Patient Instructions (Signed)
Warfarin: What You Need to Know Warfarin is an anticoagulant. Anticoagulants help prevent the formation of blood clots. They also help stop the growth of blood clots. Warfarin is sometimes referred to as a "blood thinner."  Normally, when body tissues are cut or damaged, the blood clots in order to prevent blood loss. Sometimes clots form inside your blood vessels and obstruct the flow of blood through your circulatory system (thrombosis). These clots may travel through your bloodstream and become lodged in smaller blood vessels in your brain, which can cause a stroke, or in your lungs (pulmonary embolism). WHO SHOULD USE WARFARIN? Warfarin is prescribed for people at risk of developing harmful blood clots:  People with surgically implanted mechanical heart valves, irregular heart rhythms called atrial fibrillation, and certain clotting disorders.  People who have developed harmful blood clotting in the past, including those who have had a stroke or a pulmonary embolism, or thrombosis in their legs (deep vein thrombosis [DVT]).  People with an existing blood clot, such as a pulmonary embolism. WARFARIN DOSING Warfarin tablets come in different strengths. Each tablet strength is a different color, with the amount of warfarin (in milligrams) clearly printed on the tablet. If the color of your tablet is different than usual when you receive a new prescription, report it immediately to your pharmacist or health care provider. WARFARIN MONITORING The goal of warfarin therapy is to lessen the clotting tendency of blood but not prevent clotting completely. Your health care provider will monitor the anticoagulation effect of warfarin closely and adjust your dose as needed. For your safety, blood tests called prothrombin time (PT) or international normalized ratio (INR) are used to measure the effects of warfarin. Both of these tests can be done with a finger stick or a blood draw. The longer it takes the  blood to clot, the higher the PT or INR. Your health care provider will inform you of your "target" PT or INR range. If, at any time, your PT or INR is above the target range, there is a risk of bleeding. If your PT or INR is below the target range, there is a risk of clotting. Whether you are started on warfarin while you are in the hospital or in your health care provider's office, you will need to have your PT or INR checked within one week of starting the medicine. Initially, some people are asked to have their PT or INR checked as much as twice a week. Once you are on a stable maintenance dose, the PT or INR is checked less often, usually once every 2 to 4 weeks. The warfarin dose may be adjusted if the PT or INR is not within the target range. It is important to keep all laboratory and health care provider follow-up appointments. Not keeping appointments could result in a chronic or permanent injury, pain, or disability because warfarin is a medicine that requires close monitoring. WHAT ARE THE SIDE EFFECTS OF WARFARIN?  Too much warfarin can cause bleeding (hemorrhage) from any part of the body. This may include bleeding from the gums, blood in the urine, bloody or dark stools, a nosebleed that is not easily stopped, coughing up blood, or vomiting blood.  Too little warfarin can increase the risk of blood clots.  Too little or too much warfarin can also increase the risk of a stroke.  Warfarin use may cause a skin rash or irritation, an unusual fever, continual nausea or stomach upset, or severe pain in your joints or back.   SPECIAL PRECAUTIONS WHILE TAKING WARFARIN Warfarin should be taken exactly as directed. It is very important to take warfarin as directed since bleeding or blood clots could result in chronic or permanent injury, pain, or disability.  Take your medicine at the same time every day. If you forget to take your dose, you can take it if it is within 6 hours of when it was  due.  Do not change the dose of warfarin on your own to make up for missed or extra doses.  If you miss more than 2 doses in a row, you should contact your health care provider for advice. Avoid situations that cause bleeding. You may have a tendency to bleed more easily than usual while taking warfarin. The following actions can limit bleeding:  Using a softer toothbrush.  Flossing with waxed floss rather than unwaxed floss.  Shaving with an electric razor rather than a blade.  Limiting the use of sharp objects.  Avoiding potentially harmful activities, such as contact sports. Warfarin and Pregnancy or Breastfeeding  Warfarin is not advised during the first trimester of pregnancy due to an increased risk of birth defects. In certain situations, a woman may take warfarin after her first trimester of pregnancy. A woman who becomes pregnant or plans to become pregnant while taking warfarin should notify her health care provider immediately.  Although warfarin does not pass into breast milk, a woman who wishes to breastfeed while taking warfarin should also consult with her health care provider. Alcohol, Smoking, and Illicit Drug Use  Alcohol affects how warfarin works in the body. It is best to avoid alcoholic drinks or consume very small amounts while taking warfarin. In general, alcohol intake should be limited to 1 oz (30 mL) of liquor, 6 oz (180 mL) of wine, or 12 oz (360 mL) of beer each day. Notify your health care provider if you change your alcohol intake.  Smoking affects how warfarin works. It is best to avoid smoking while taking warfarin. Notify your health care provider if you change your smoking habits.  It is best to avoid all illicit drugs while taking warfarin since there are few studies that show how warfarin interacts with these drugs. Other Medicines and Dietary Supplements Many prescription and over-the-counter medicines can interfere with warfarin. Be sure all of your  health care providers know you are taking warfarin. Notify your health care provider who prescribed warfarin for you or your pharmacist before starting or stopping any new medicines, including over-the-counter vitamins, dietary supplements, and pain medicines. Your warfarin dose may need to be adjusted. Some common over-the-counter medicines that may increase the risk of bleeding while taking warfarin include:   Acetaminophen.  Aspirin.  Nonsteroidal anti-inflammatory medicines (NSAIDs), such as ibuprofen or naproxen.  Vitamin E. Dietary Considerations  Foods that have moderate or high amounts of vitamin K can interfere with warfarin. Avoid major changes in your diet or notify your health care provider before changing your diet. Eat a consistent amount of foods that have moderate or high amounts of vitamin K. Eating less foods containing vitamin K can increase the risk of bleeding. Eating more foods containing vitamin K can increase the risk of blood clots. Additional questions about dietary considerations can be discussed with a dietitian. Foods that are very high in vitamin K:  Greens, such as Swiss chard and beet, collard, mustard, or turnip greens (fresh or frozen, cooked).  Kale (fresh or frozen, cooked).  Parsley (raw).  Spinach (cooked). Foods that are high   in vitamin K:  Asparagus (frozen, cooked).  Beans, green (frozen, cooked).  Broccoli.  Bok choy (cooked).  Brussels sprouts (fresh or frozen, cooked).  Cabbage (cooked).   Coleslaw. Foods that are moderately high in vitamin K:  Blueberries.  Black-eyed peas.  Endive (raw).  Green leaf lettuce (raw).  Green scallions (raw).  Kale (raw).  Okra (frozen, cooked).  Plantains (fried).  Romaine lettuce (raw).  Sauerkraut (canned).  Spinach (raw). CALL YOUR CLINIC OR HEALTH CARE PROVIDER IF YOU:  Plan to have any surgery or procedure.  Feel sick, especially if you have diarrhea or  vomiting.  Experience or anticipate any major changes in your diet.  Start or stop a prescription or over-the-counter medicine.  Become, plan to become, or think you may be pregnant.  Are having heavier than usual menstrual periods.  Have had a fall, accident, or any symptoms of bleeding or unusual bruising.  Develop an unusual fever. CALL 911 IN THE [place] OR GO TO THE EMERGENCY DEPARTMENT IF YOU:   Think you may be having an allergic reaction to warfarin. The signs of an allergic reaction could include itching, rash, hives, swelling, chest tightness, or trouble breathing.  See signs of blood in your urine. The signs could include reddish, pinkish, or tea-colored urine.  See signs of blood in your stools. The signs could include bright red or black stools.  Vomit or cough up blood. In these instances, the blood could have either a bright red or a "coffee-grounds" appearance.  Have bleeding that will not stop after applying pressure for 30 minutes such as cuts, nosebleeds, or other injuries.  Have severe pain in your joints or back.  Have a new and severe headache.  Have sudden weakness or numbness of your face, arm, or leg, especially on one side of your body.  Have sudden confusion or trouble understanding.  Have sudden trouble seeing in one or both eyes.  Have sudden trouble walking, dizziness, loss of balance, or coordination.  Have trouble speaking or understanding (aphasia). Document Released: 06/19/2005 Document Revised: 11/03/2013 Document Reviewed: 12/13/2012 [facility] Patient Information 2015 [place], [place]. This information is not intended to replace advice given to you by your health care provider. Make sure you discuss any questions you have with your health care provider. Warfarin Coagulopathy Warfarin (Coumadin) coagulopathy refers to bleeding that may occur as a complication of the medicine warfarin. Warfarin is an oral blood thinner (anticoagulant). Warfarin  is used for medical conditions where thinning of the blood is needed to prevent blood clots.  CAUSES Bleeding is the most common and most serious complication of warfarin. The amount of bleeding is related to the warfarin dose and length of treatment. In addition, bleeding complications can also occur due to:  Intentional or accidental warfarin overdose.  Underlying medical conditions.  Dietary changes.  Medicine, herbal, supplement, or alcohol interactions. SYMPTOMS Severe bleeding while on warfarin may occur from any tissue or organ. Symptoms of the blood being too thin may include:  Bleeding from the nose or gums.  Blood in bowel movements which may appear as bright red, dark, or black tarry stools.  Blood in the urine which may appear as pink, red, or brown urine.  Unusual bruising or bruising easily.  A cut that does not stop bleeding within 10 minutes.  Vomiting blood or continuous nausea for more than 1 day.  Coughing up blood.  Broken blood vessels in your eye (subconjunctival hemorrhage).  Abdominal or back pain with or without flank  bruising.  Sudden, severe headache.  Sudden weakness or numbness of the face, arm, or leg, especially on one side of the body.  Sudden confusion.  Trouble speaking (aphasia) or understanding.  Sudden trouble seeing in one or both eyes.  Sudden trouble walking.  Dizziness.  Loss of balance or coordination.  Vaginal bleeding.  Swelling or pain at an injection site.  Superficial fat tissue death (necrosis) which may cause skin scarring. This is more common in women and may first present as pain in the waist, thighs, or buttocks.  Fever. HOME CARE INSTRUCTIONS  Always contact your health care provider of any concerns or signs of possible warfarin coagulopathy as soon as possible.  Take warfarin exactly as directed by your health care provider. It is recommended that you take your warfarin dose at the same time of the day.  If you have been told to stop taking warfarin, do not resume taking warfarin until directed to do so by your health care provider. Follow your health care provider's instructions if you accidentally take an extra dose or miss a dose of warfarin. It is very important to take warfarin as directed since bleeding or blood clots could result in chronic or permanent injury, pain, or disability.  Keep all follow-up appointments with your health care provider as directed. It is very important to keep your appointments. Not keeping appointments could result in a chronic or permanent injury, pain, or disability because warfarin is a medicine that requires close monitoring.  While taking warfarin, you will need to have regular blood tests to measure your blood clotting time. These blood tests usually include both the prothrombin time (PT) and International Normalized Ratio (INR) tests. The PT and INR results allow your health care provider to adjust your dose of warfarin. The dose can change for many reasons. It is critically important that you have your PT and INR levels drawn exactly as directed. Your warfarin dose may stay the same or change depending on what the PT and INR results are. Be sure to follow up with your health care provider regarding your PT and INR test results and what your warfarin dosage should be.  Many medicines can interfere with warfarin and affect the PT and INR results. You must tell your health care provider about any and all medicines you take. This includes all vitamins and supplements. Ask your health care provider before taking these. Prescription and over-the-counter medicine consistency is critical to warfarin management. It is important that potential interactions are checked before you start a new medicine. Be especially cautious with aspirin and anti-inflammatory medicines. Ask your health care provider before taking these. Medicines such as antibiotics and acid-reducing medicine can  interact with warfarin and can cause an increased warfarin effect. Warfarin can also interfere with the effectiveness of medicines you are taking. Do not take or discontinue any prescribed or over-the-counter medicine except on the advice of your health care provider or pharmacist.  Some vitamins, supplements, and herbal products interfere with the effectiveness of warfarin. Vitamin E may increase the anticoagulant effects of warfarin. Vitamin K can cause warfarin to be less effective. Do not take or discontinue any vitamin, supplement, or herbal product except on the advice of your health care provider or pharmacist.  Eat what you normally eat and keep the vitamin K content of your diet consistent. Avoid major changes in your diet, or notify your health care provider before changing your diet. Suddenly getting a lot more vitamin K could cause your  blood to clot too quickly. A sudden decrease in vitamin K intake could cause your blood to clot too slowly. These changes in vitamin K intake could lead to dangerous blood clotsor to bleeding. To keep your vitamin K intake consistent, you must be aware of which foods contain moderate or high amounts of vitamin K. Some foods high in vitamin K include spinach, kale, broccoli, cabbage, greens, Brussels sprouts, asparagus, bok choy, coleslaw, parsley, and green tea. Arrange a visit with a dietitian to answer your questions.  If you have a loss of appetite or get the stomach flu (viral gastroenteritis), talk to your health care provider as soon as possible. A decrease in your normal vitamin K intake can make you more sensitive to your usual dose of warfarin.  Some medical conditions may increase your risk for bleeding while you are taking warfarin. A fever, diarrhea lasting more than a day, worsening heart failure, or worsening liver function are some medical conditions that could affect warfarin. Contact your health care provider if you have any of these medical  conditions.  Be careful not to cut yourself when using sharp objects or while shaving.  Alcohol can change the body's ability to handle warfarin. It is best to avoid alcoholic drinks or consume only very small amounts while taking warfarin. Notify your health care provider if you change your alcohol intake. A sudden increase in alcohol use can increase your risk of bleeding. Chronic alcohol use can cause warfarin to be less effective.  Limit physical activities or sports that could result in a fall or cause injury.  Do not use warfarin if you are pregnant.  Inform all your health care providers and your dentist that you take warfarin.  Inform all health care providers if you are taking warfarin and aspirin or platelet inhibitor medicines such as clopidogrel, ticagrelor, or prasugrel. Use of these medicines in addition to warfarin can increase your risk of bleeding or death. Taking these medicines together should only be done under the direct care of your health care providers. SEEK IMMEDIATE MEDICAL CARE IF:  You cough up blood.  You have dark or black stools or there is bright red blood coming from your rectum.  You vomit blood or have nausea for more than 1 day.  You have blood in the urine or pink-colored urine.  You have unusual bruising or have increased bruising.  You have bleeding from the nose or gums that does not stop quickly.  You have a cut that does not stop bleeding within 2-3 minutes.  You have sudden weakness or numbness of the face, arm, or leg, especially on one side of the body.  You have sudden confusion.  You have trouble speaking (aphasia) or understanding.  You have sudden trouble seeing in one or both eyes.  You have sudden trouble walking.  You have dizziness.  You have a loss of balance or coordination.  You have a sudden, severe headache.  You have a serious fall or head injury, even if you are not bleeding.  You have swelling or pain at an  injection site.  You have unexplained tenderness or pain in the abdomen, back, waist, thighs, or buttocks.  You have a fever. Any of these symptoms may represent a serious problem that is an emergency. Do not wait to see if the symptoms will go away. Get medical help right away. Call your local emergency services (911 in [place] Do not drive yourself to the hospital. Document Released: 05/28/2006 Document  Revised: 11/03/2013 Document Reviewed: 11/28/2011 [facility] Patient Information 2015 [place], [place]. This information is not intended to replace advice given to you by your health care provider. Make sure you discuss any questions you have with your health care provider.

## 2014-12-18 NOTE — Progress Notes (Signed)
   BP 126/78 mmHg  Pulse 64  Temp(Src) 98.8 F (37.1 C) (Oral)  Ht 5' 5.5" (1.664 m)  Wt 321 lb 9.6 oz (145.877 kg)  BMI 52.68 kg/m2  SpO2 96%   Subjective:    Patient ID: Roberto [name], male    DOB: 1955-09-08, 59 y.o.   MRN: [id number]  CC: Coumadin management  HPI: This patient is a 59 y.o. male who presents for coumadin management. The expected duration of coumadin treatment is lifelong The reason for anticoagulation is  mechanical heart valve.  Present Coumadin dose: 7.5 mgs 1/,1/2, whole Goal: 2.5-3.5 The patient does not have an active anticoagulation episode. Excessive bruising: no Nose bleeding: no Rectal bleeding: no Prolonged menstrual cycles: N/A Eating diet with consistent amounts of foods containing Vitamin K:no Any recent antibiotic use? no   Wants to quit smoking and asking about Chantix  ROS: Per HPI unless specifically indicated above     Objective:    BP 126/78 mmHg  Pulse 64  Temp(Src) 98.8 F (37.1 C) (Oral)  Ht 5' 5.5" (1.664 m)  Wt 321 lb 9.6 oz (145.877 kg)  BMI 52.68 kg/m2  SpO2 96%  Wt Readings from Last 3 Encounters:  12/18/14 321 lb 9.6 oz (145.877 kg)     General: Well appearing, well nourished in no distress.  Normal mood and affect. Skin: No excessive bruising or rash  Current INR is 2.3.    Last CBC:  Lab Results  Component Value Date   WBC 8.3 05/25/2014   HGB 13.5 05/25/2014   HCT 41.6 05/25/2014   MCV 88 05/25/2014   PLT 259 05/25/2014        Assessment:     ICD-9-CM ICD-10-CM   1. 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 Tobacco dependence 305.1 F17.200     Plan:   Discussed current plan face-to-face with patient. For coumadin dosing, elected to continue current dose. Will plan to recheck INR in 2 weeks  Quit smoking information given.  Discussed that Chantix and Warfarin interacy.

## 2015-01-01 ENCOUNTER — Encounter: Payer: Self-pay | Admitting: Family Medicine

## 2015-01-01 ENCOUNTER — Ambulatory Visit (INDEPENDENT_AMBULATORY_CARE_PROVIDER_SITE_OTHER): Payer: Self-pay | Admitting: Family Medicine

## 2015-01-01 VITALS — BP 125/74 | HR 55 | Ht 64.6 in | Wt 318.0 lb

## 2015-01-01 DIAGNOSIS — Z954 Presence of other heart-valve replacement: Secondary | ICD-10-CM | POA: Diagnosis not present

## 2015-01-01 DIAGNOSIS — Z952 Presence of prosthetic heart valve: Secondary | ICD-10-CM

## 2015-01-01 LAB — COAGUCHEK XS/INR WAIVED
INR: 2 — AB (ref 0.9–1.1)
Prothrombin Time: 23.9 s

## 2015-01-01 MED ORDER — WARFARIN SODIUM 4 MG PO TABS: 4.0000 mg | ORAL_TABLET | Freq: Every day | ORAL | Status: DC

## 2015-01-01 NOTE — Progress Notes (Signed)
BP 125/74 mmHg  Pulse 55  Ht 5' 4.6" (1.641 m)  Wt 318 lb (144.244 kg)  BMI 53.56 kg/m2  SpO2 93%   Subjective:    Patient ID: Roberto [name], male    DOB: 1955-07-10, 59 y.o.   MRN: [id number]  CC: Coumadin management  HPI: This patient is a 59 y.o. male who presents for coumadin management. The expected duration of coumadin treatment is lifelong The reason for anticoagulation is  mechanical heart valve.  Present Coumadin dose: 3.75/7.5 alternating Goal: 2.5-3.5  Excessive bruising: no Nose bleeding: no Rectal bleeding: no Prolonged menstrual cycles: N/A Eating diet with consistent amounts of foods containing Vitamin K:no Any recent antibiotic use? no  ROS: Per HPI unless specifically indicated above     Objective:    BP 125/74 mmHg  Pulse 55  Ht 5' 4.6" (1.641 m)  Wt 318 lb (144.244 kg)  BMI 53.56 kg/m2  SpO2 93%  Wt Readings from Last 3 Encounters:  01/01/15 318 lb (144.244 kg)  11/17/14 319 lb (144.697 kg)  12/18/14 321 lb 9.6 oz (145.877 kg)     General: Well appearing, well nourished in no distress.  Normal mood and affect. Skin: No excessive bruising or rash  Last INR: 2.0    Last CBC:  Lab Results  Component Value Date   WBC 8.3 05/25/2014   HGB 13.5 05/25/2014   HCT 41.6 05/25/2014   MCV 88 05/25/2014   PLT 259 05/25/2014    Results for orders placed or performed in visit on 12/18/14  CoaguChek XS/INR Waived  Result Value Ref Range   INR 2.3 (H) 0.9 - 1.1   Prothrombin Time 27.8 sec       Assessment:     ICD-9-CM ICD-10-CM   1. Heart valve replaced V43.3 Z95.4 CoaguChek XS/INR Waived    Plan:   Discussed current plan face-to-face with patient. For coumadin dosing, elected to change dose to. Will plan to recheck INR in 1 week.

## 2015-01-07 ENCOUNTER — Other Ambulatory Visit: Payer: Self-pay | Admitting: Family Medicine

## 2015-01-07 DIAGNOSIS — Z952 Presence of prosthetic heart valve: Secondary | ICD-10-CM

## 2015-01-08 ENCOUNTER — Ambulatory Visit (INDEPENDENT_AMBULATORY_CARE_PROVIDER_SITE_OTHER): Payer: Self-pay | Admitting: Family Medicine

## 2015-01-08 ENCOUNTER — Encounter: Payer: Self-pay | Admitting: Family Medicine

## 2015-01-08 DIAGNOSIS — Z954 Presence of other heart-valve replacement: Secondary | ICD-10-CM

## 2015-01-08 DIAGNOSIS — Z952 Presence of prosthetic heart valve: Secondary | ICD-10-CM

## 2015-01-08 LAB — COAGUCHEK XS/INR WAIVED
INR: 2.5 — ABNORMAL HIGH (ref 0.9–1.1)
PROTHROMBIN TIME: 30 s

## 2015-01-08 NOTE — Patient Instructions (Signed)
Vitamin K Foods and Warfarin Warfarin is a medicine that helps prevent harmful blood clots by causing blood to clot more slowly. It does this by decreasing the activity of vitamin K, which promotes normal blood clotting. For the dose of warfarin you have been prescribed to work well, you need to get about the same amount of vitamin K from your food from day to day. Suddenly getting a lot more vitamin K could cause your blood to clot too quickly. A sudden decrease in vitamin K intake could cause your blood to clot too slowly. These changes in vitamin K intake could lead to dangerous blood clotsor to bleeding. WHAT GENERAL GUIDELINES DO I NEED TO FOLLOW?  Keep your intake of vitamin K consistent from day to day. To do this, you must be aware of which foods contain moderate or high amounts of vitamin K. Listed below are some foods that are very high, high, or moderately high in vitamin K. If you eat these foods, make sure you eat a consistent amount of them from day to day.  Avoid major changes in your diet, or tell your health care provider before changing your diet.  If you take a multivitamin that contains vitamin K, be sure to take it every day.  If you drink green tea, drink the same amount each day. WHAT FOODS ARE VERY HIGH IN VITAMIN K?   Greens, such as Swiss chard and beet, collard, mustard, or turnip greens (fresh or frozen, cooked).  Kale (fresh or frozen, cooked).   Parsley (raw).  Spinach (cooked).  WHAT FOODS ARE HIGH IN VITAMIN K?  Asparagus (frozen, cooked).  Beans, green (frozen, cooked).  Broccoli.   Bok choy (cooked).   Brussels sprouts (fresh or frozen, cooked).  Cabbage (cooked).  Coleslaw. WHAT FOODS ARE MODERATELY HIGH IN VITAMIN K?  Blueberries.  Black-eyed peas.  Endive (raw).   Green leaf lettuce (raw).   Green scallions (raw).  Kale (raw).  Okra (frozen, cooked).  Plantains (fried).  Romaine lettuce (raw).   Sauerkraut  (canned).   Spinach (raw). Document Released: 04/16/2009 Document Revised: 06/24/2013 Document Reviewed: 04/23/2013 [facility] Patient Information 2015 [place], [place]. This information is not intended to replace advice given to you by your health care provider. Make sure you discuss any questions you have with your health care provider.

## 2015-01-08 NOTE — Progress Notes (Signed)
   BP 131/76 mmHg  Pulse 75  Temp(Src) 98.7 F (37.1 C)  Ht 5' 5.6" (1.666 m)  Wt 319 lb 3.2 oz (144.788 kg)  BMI 52.17 kg/m2  SpO2 95%   Subjective:    Patient ID: Roberto [name], male    DOB: [birth year], 59 y.o.   MRN: [id number]  CC: Coumadin management  HPI: This patient is a 59 y.o. male who presents for coumadin management. The expected duration of coumadin treatment is lifelong The reason for anticoagulation is  mechanical heart valve.  Present Coumadin dose: 4mg  for 2 days, 7.5 for 1 day repeating Goal: 2.5-3.5  Excessive bruising: no Nose bleeding: no Rectal bleeding: no Prolonged menstrual cycles: N/A Eating diet with consistent amounts of foods containing Vitamin K:no Any recent antibiotic use? no  ROS: Per HPI unless specifically indicated above     Objective:    BP 131/76 mmHg  Pulse 75  Temp(Src) 98.7 F (37.1 C)  Ht 5' 5.6" (1.666 m)  Wt 319 lb 3.2 oz (144.788 kg)  BMI 52.17 kg/m2  SpO2 95%  Wt Readings from Last 3 Encounters:  01/08/15 319 lb 3.2 oz (144.788 kg)  01/01/15 318 lb (144.244 kg)  11/17/14 319 lb (144.697 kg)     General: Well appearing, well nourished in no distress.  Normal mood and affect. Skin: No excessive bruising or rash  Last INR: 2.5 Last PT: 30.0    Last CBC:  Lab Results  Component Value Date   WBC 8.3 05/25/2014   HGB 13.5 05/25/2014   HCT 41.6 05/25/2014   MCV 88 05/25/2014   PLT 259 05/25/2014    Results for orders placed or performed in visit on 01/01/15  CoaguChek XS/INR Waived  Result Value Ref Range   INR 2.0 (H) 0.9 - 1.1   Prothrombin Time 23.9 sec       Assessment:     ICD-9-CM ICD-10-CM   1. Mechanical heart valve present V43.3 Z95.4 CoaguChek XS/INR Waived    Plan:   Discussed current plan face-to-face with patient. For coumadin dosing, elected to continue current dose. Will plan to recheck INR in 2 weeks.

## 2015-01-19 ENCOUNTER — Other Ambulatory Visit: Payer: Self-pay | Admitting: Family Medicine

## 2015-01-19 DIAGNOSIS — Z952 Presence of prosthetic heart valve: Secondary | ICD-10-CM

## 2015-01-26 ENCOUNTER — Ambulatory Visit (INDEPENDENT_AMBULATORY_CARE_PROVIDER_SITE_OTHER): Payer: Self-pay | Admitting: Family Medicine

## 2015-01-26 ENCOUNTER — Encounter: Payer: Self-pay | Admitting: Family Medicine

## 2015-01-26 DIAGNOSIS — Z954 Presence of other heart-valve replacement: Secondary | ICD-10-CM

## 2015-01-26 DIAGNOSIS — Z952 Presence of prosthetic heart valve: Secondary | ICD-10-CM

## 2015-01-26 LAB — COAGUCHEK XS/INR WAIVED
INR: 2.9 — AB (ref 0.9–1.1)
Prothrombin Time: 34.7 s

## 2015-01-26 MED ORDER — WARFARIN SODIUM 4 MG PO TABS: 4.0000 mg | ORAL_TABLET | Freq: Every day | ORAL | Status: DC

## 2015-01-26 NOTE — Progress Notes (Signed)
HPI    BP 111/79 mmHg  Pulse 71  Temp(Src) 99 F (37.2 C)  Wt 319 lb 6.4 oz (144.879 kg)  SpO2 92%   Subjective:    Patient ID: Roberto [name], male    DOB: [birth year], 59 y.o.   MRN: [id number]  CC: Coumadin management  HPI: This patient is a 59 y.o. male who presents for coumadin management. The expected duration of coumadin treatment is lifelong The reason for anticoagulation is  mechanical heart valve.  Present Coumadin dose:7.5,4,4,7.5 Goal: 2.5-3.5 Excessive bruising: no Nose bleeding: no Rectal bleeding: no Prolonged menstrual cycles: N/A Eating diet with consistent amounts of foods containing Vitamin K:yes Any recent antibiotic use? no  ROS: Per HPI unless specifically indicated above     Objective:    BP 111/79 mmHg  Pulse 71  Temp(Src) 99 F (37.2 C)  Wt 319 lb 6.4 oz (144.879 kg)  SpO2 92%  Wt Readings from Last 3 Encounters:  01/26/15 319 lb 6.4 oz (144.879 kg)  01/08/15 319 lb 3.2 oz (144.788 kg)  01/01/15 318 lb (144.244 kg)     General: Well appearing, well nourished in no distress.  Normal mood and affect. Skin: No excessive bruising or rash  Last INR: 2.9 PT: 34.7    Last CBC:  Lab Results  Component Value Date   WBC 8.3 05/25/2014   HGB 13.5 05/25/2014   HCT 41.6 05/25/2014   MCV 88 05/25/2014   PLT 259 05/25/2014    Results for orders placed or performed in visit on 01/08/15  CoaguChek XS/INR Waived  Result Value Ref Range   INR 2.5 (H) 0.9 - 1.1   Prothrombin Time 30.0 sec       Assessment:     ICD-9-CM ICD-10-CM   1. Mechanical heart valve present V43.3 Z95.4 CoaguChek XS/INR Waived    Plan:   Discussed current plan face-to-face with patient. For coumadin dosing, elected to continue current dose. Will plan to recheck INR in 2 weeks.     Review of Systems     Physical Exam

## 2015-02-09 ENCOUNTER — Encounter: Payer: Self-pay | Admitting: Family Medicine

## 2015-02-09 ENCOUNTER — Ambulatory Visit (INDEPENDENT_AMBULATORY_CARE_PROVIDER_SITE_OTHER): Payer: Self-pay | Admitting: Family Medicine

## 2015-02-09 VITALS — BP 120/74 | HR 70 | Temp 98.5°F | Wt 318.8 lb

## 2015-02-09 DIAGNOSIS — I10 Essential (primary) hypertension: Secondary | ICD-10-CM

## 2015-02-09 DIAGNOSIS — E782 Mixed hyperlipidemia: Secondary | ICD-10-CM | POA: Insufficient documentation

## 2015-02-09 DIAGNOSIS — Z113 Encounter for screening for infections with a predominantly sexual mode of transmission: Secondary | ICD-10-CM

## 2015-02-09 DIAGNOSIS — E785 Hyperlipidemia, unspecified: Secondary | ICD-10-CM

## 2015-02-09 DIAGNOSIS — E1159 Type 2 diabetes mellitus with other circulatory complications: Secondary | ICD-10-CM | POA: Insufficient documentation

## 2015-02-09 DIAGNOSIS — Z954 Presence of other heart-valve replacement: Secondary | ICD-10-CM | POA: Diagnosis not present

## 2015-02-09 DIAGNOSIS — Z952 Presence of prosthetic heart valve: Secondary | ICD-10-CM | POA: Insufficient documentation

## 2015-02-09 LAB — LIPID PANEL PICCOLO, WAIVED
CHOL/HDL RATIO PICCOLO,WAIVE: 3.9 mg/dL
CHOLESTEROL PICCOLO, WAIVED: 161 mg/dL (ref <200)
HDL Chol Piccolo, Waived: 41 mg/dL — ABNORMAL LOW (ref >59)
LDL CHOL CALC PICCOLO WAIVED: 63 mg/dL (ref <100)
TRIGLYCERIDES PICCOLO,WAIVED: 284 mg/dL — AB (ref <150)
VLDL Chol Calc Piccolo,Waive: 57 mg/dL — ABNORMAL HIGH (ref <30)

## 2015-02-09 LAB — COAGUCHEK XS/INR WAIVED
INR: 2.5 — ABNORMAL HIGH (ref 0.9–1.1)
PROTHROMBIN TIME: 30.4 s

## 2015-02-09 MED ORDER — LISINOPRIL 10 MG PO TABS: 10.0000 mg | ORAL_TABLET | Freq: Every day | ORAL | Status: DC

## 2015-02-09 MED ORDER — METOPROLOL SUCCINATE ER 100 MG PO TB24: 100.0000 mg | ORAL_TABLET | Freq: Every day | ORAL | Status: DC

## 2015-02-09 MED ORDER — SIMVASTATIN 40 MG PO TABS: 40.0000 mg | ORAL_TABLET | Freq: Every day | ORAL | Status: DC

## 2015-02-09 NOTE — Assessment & Plan Note (Signed)
Under good control. Continue current regimen. Continue to monitor.  

## 2015-02-09 NOTE — Progress Notes (Signed)
BP 120/74 mmHg  Pulse 70  Temp(Src) 98.5 F (36.9 C)  Wt 318 lb 12.8 oz (144.607 kg)  SpO2 96%   Subjective:    Patient ID: Roberto [name], male    DOB: 08-30-1955, 58 y.o.   MRN: [id number]  HPI: Roberto [name] is a 59 y.o. male  Chief Complaint  Patient presents with  . Anticoagulation  . Hypertension  . Hyperlipidemia   HYPERTENSION / HYPERLIPIDEMIA Satisfied with current treatment? no Duration of hypertension: chronic BP monitoring frequency: not checking BP medication side effects: yes Duration of hyperlipidemia: chronic Cholesterol medication side effects: no Cholesterol supplements: none Medication compliance: excellent compliance Aspirin: yes Recent stressors: no Recurrent headaches: no Visual changes: no Palpitations: no Dyspnea: no Chest pain: no Lower extremity edema: no Dizzy/lightheaded: no  HPI: This patient is a 59 y.o. male who presents for coumadin management. The expected duration of coumadin treatment is lifelong The reason for anticoagulation is  mechanical heart valve.  Present Coumadin dose: 7.5, 4, 4, 7.5 Goal: 2.5-3.5  Excessive bruising: no Nose bleeding: no Rectal bleeding: no Prolonged menstrual cycles: N/A Eating diet with consistent amounts of foods containing Vitamin K:yes Any recent antibiotic use? no  Last INR: 2.5 PT: 30.4   Last CBC:  Lab Results  Component Value Date   WBC 8.3 05/25/2014   HGB 13.5 05/25/2014   HCT 41.6 05/25/2014   MCV 88 05/25/2014   PLT 259 05/25/2014     Relevant past medical, surgical, family and social history reviewed and updated as indicated. Interim medical history since our last visit reviewed. Allergies and medications reviewed and updated.  Review of Systems  Constitutional: Negative.   Respiratory: Negative.   Cardiovascular: Negative.   Gastrointestinal: Negative.   Psychiatric/Behavioral: Negative.     Per HPI unless specifically indicated above     Objective:    BP  120/74 mmHg  Pulse 70  Temp(Src) 98.5 F (36.9 C)  Wt 318 lb 12.8 oz (144.607 kg)  SpO2 96%  Wt Readings from Last 3 Encounters:  02/09/15 318 lb 12.8 oz (144.607 kg)  01/26/15 319 lb 6.4 oz (144.879 kg)  01/08/15 319 lb 3.2 oz (144.788 kg)    Physical Exam  Constitutional: He is oriented to person, place, and time. He appears well-developed and well-nourished. No distress.  HENT:  Head: Normocephalic and atraumatic.  Right Ear: Hearing normal.  Left Ear: Hearing normal.  Nose: Nose normal.  Eyes: Conjunctivae and lids are normal. Right eye exhibits no discharge. Left eye exhibits no discharge. No scleral icterus.  Pulmonary/Chest: Effort normal. No respiratory distress.  Musculoskeletal: Normal range of motion.  Neurological: He is alert and oriented to person, place, and time.  Skin: Skin is intact. No rash noted.  Psychiatric: He has a normal mood and affect. His speech is normal and behavior is normal. Judgment and thought content normal. Cognition and memory are normal.    Results for orders placed or performed in visit on 01/26/15  CoaguChek XS/INR Waived  Result Value Ref Range   INR 2.9 (H) 0.9 - 1.1   Prothrombin Time 34.7 sec      Assessment & Plan:   Problem List Items Addressed This Visit      Cardiovascular and Mediastinum   Mechanical heart valve present - Primary    Discussed current plan face-to-face with patient. For coumadin dosing, elected to continue current dose. Will plan to recheck INR in 1 month.      Relevant Medications   simvastatin (ZOCOR)  40 MG tablet   lisinopril (PRINIVIL,ZESTRIL) 10 MG tablet   metoprolol succinate (TOPROL-XL) 100 MG 24 hr tablet   Other Relevant Orders   CoaguChek XS/INR Waived (STAT)   Essential hypertension    Under good control today. Continue current regimen. Continue to monitor.       Relevant Medications   simvastatin (ZOCOR) 40 MG tablet   lisinopril (PRINIVIL,ZESTRIL) 10 MG tablet   metoprolol succinate  (TOPROL-XL) 100 MG 24 hr tablet   Other Relevant Orders   Comprehensive metabolic panel     Other   HLD (hyperlipidemia)    Under good control. Continue current regimen. Continue to monitor.       Relevant Medications   simvastatin (ZOCOR) 40 MG tablet   lisinopril (PRINIVIL,ZESTRIL) 10 MG tablet   metoprolol succinate (TOPROL-XL) 100 MG 24 hr tablet   Other Relevant Orders   Lipid Panel Piccolo, Waived    Other Visit Diagnoses    Routine screening for STI (sexually transmitted infection)        Chescking due to age. Drawn today.     Relevant Orders    Hepatitis C Antibody        Follow up plan: Return in about 4 weeks (around 03/09/2015) for INR check.

## 2015-02-09 NOTE — Assessment & Plan Note (Signed)
Under good control today. Continue current regimen. Continue to monitor.  

## 2015-02-09 NOTE — Assessment & Plan Note (Signed)
Discussed current plan face-to-face with patient. For coumadin dosing, elected to continue current dose. Will plan to recheck INR in 1 month. 

## 2015-02-10 ENCOUNTER — Encounter: Payer: Self-pay | Admitting: Family Medicine

## 2015-02-10 LAB — COMPREHENSIVE METABOLIC PANEL
ALBUMIN: 4 g/dL (ref 3.6–4.8)
ALT: 17 IU/L (ref 0–44)
AST: 14 IU/L (ref 0–40)
Albumin/Globulin Ratio: 1.5 (ref 1.1–2.5)
Alkaline Phosphatase: 47 IU/L (ref 39–117)
BILIRUBIN TOTAL: 0.3 mg/dL (ref 0.0–1.2)
BUN/Creatinine Ratio: 16 (ref 10–22)
BUN: 15 mg/dL (ref 8–27)
CALCIUM: 8.8 mg/dL (ref 8.6–10.2)
CO2: 24 mmol/L (ref 18–29)
Chloride: 102 mmol/L (ref 97–108)
Creatinine, Ser: 0.92 mg/dL (ref 0.76–1.27)
GFR calc non Af Amer: 90 mL/min/{1.73_m2} (ref >59)
GFR, EST AFRICAN AMERICAN: 104 mL/min/{1.73_m2} (ref >59)
Globulin, Total: 2.7 g/dL (ref 1.5–4.5)
Glucose: 116 mg/dL — ABNORMAL HIGH (ref 65–99)
Potassium: 4.7 mmol/L (ref 3.5–5.2)
Sodium: 141 mmol/L (ref 134–144)
TOTAL PROTEIN: 6.7 g/dL (ref 6.0–8.5)

## 2015-02-10 LAB — HEPATITIS C ANTIBODY: Hep C Virus Ab: 0.3 s/co ratio (ref 0.0–0.9)

## 2015-03-05 ENCOUNTER — Other Ambulatory Visit: Payer: Self-pay | Admitting: Family Medicine

## 2015-03-09 ENCOUNTER — Telehealth: Payer: Self-pay

## 2015-03-09 NOTE — Telephone Encounter (Signed)
I reviewed Programme researcher, broadcasting/film/video; he used to be on tartrate; I don't see anything that indicates an intentional switch to succinate I called pharmacy and spoke with staff there  His last regular correct Rx was metoprolol tartrate 25 mg BID from December 15, 2014; that came from Dr. Abran Richard, his cardiologist; then Dr. Wynetta Emery filled the 100 mg succinate but that appears to have been in error The patient established care here but still goes back to see the cardiologist every 6 months in [facility] according to that note I asked the pharmacist to please send the Rx request to his cardiologist and I'll correct our med list here; if any problems, call us back -------------------------- Faythe Ghee, [facility] staff, I cannot figure out how to enter a new medicine without making it look like I am prescribing it Please UPDATE the medicine list He is supposed to take metoprolol tartrate 25 mg one by mouth twice a day; that is prescribed by outside provider, Dr. Otho Perl

## 2015-03-09 NOTE — Telephone Encounter (Signed)
Patients friend called, he was given metoprolol succ this time instead of metoprolol tart which is a lot less expensive. Was this done on an accident or was it changed for a reason. Patient was unaware of a change.  [employer] will take back the succinate if a new script for tartrate is sent over

## 2015-03-09 NOTE — Telephone Encounter (Signed)
Unsure of what to do about this, patient is wanting a response

## 2015-03-09 NOTE — Telephone Encounter (Signed)
Medication updated in chart, unable to reach patient to notify him that it was sent to Dr.Folk for a refill authorization.

## 2015-03-09 NOTE — Telephone Encounter (Signed)
Please update the med list Pharmacy sent it to his cardiologist to get the refill

## 2015-03-15 ENCOUNTER — Ambulatory Visit (INDEPENDENT_AMBULATORY_CARE_PROVIDER_SITE_OTHER): Payer: Self-pay | Admitting: Family Medicine

## 2015-03-15 ENCOUNTER — Encounter: Payer: Self-pay | Admitting: Family Medicine

## 2015-03-15 VITALS — BP 127/78 | HR 66 | Temp 97.6°F | Wt 318.0 lb

## 2015-03-15 DIAGNOSIS — Z954 Presence of other heart-valve replacement: Secondary | ICD-10-CM | POA: Diagnosis not present

## 2015-03-15 DIAGNOSIS — Z952 Presence of prosthetic heart valve: Secondary | ICD-10-CM

## 2015-03-15 LAB — COAGUCHEK XS/INR WAIVED
INR: 2.8 — ABNORMAL HIGH (ref 0.9–1.1)
Prothrombin Time: 33.1 s

## 2015-03-15 NOTE — Progress Notes (Signed)
HPI   BP 127/78 mmHg  Pulse 66  Temp(Src) 97.6 F (36.4 C)  Wt 318 lb (144.244 kg)  SpO2 95%   Subjective:    Patient ID: Roberto [name], male    DOB: [birth year], 59 y.o.   MRN: [id number]  CC: Coumadin management  HPI: This patient is a 59 y.o. male who presents for coumadin management. The expected duration of coumadin treatment is lifelong The reason for anticoagulation is  mechanical heart valve.  Present Coumadin dose: Goal: 2.5-3.5  Excessive bruising: no Nose bleeding: no Rectal bleeding: no Prolonged menstrual cycles: N/A Eating diet with consistent amounts of foods containing Vitamin K:yes Any recent antibiotic use? no  ROS: Per HPI unless specifically indicated above     Objective:    BP 127/78 mmHg  Pulse 66  Temp(Src) 97.6 F (36.4 C)  Wt 318 lb (144.244 kg)  SpO2 95%  Wt Readings from Last 3 Encounters:  03/15/15 318 lb (144.244 kg)  02/09/15 318 lb 12.8 oz (144.607 kg)  01/26/15 319 lb 6.4 oz (144.879 kg)     General: Well appearing, well nourished in no distress.  Normal mood and affect. Skin: No excessive bruising or rash  Last INR: 2.8 PT: 33.1    Last CBC:  Lab Results  Component Value Date   WBC 8.3 05/25/2014   HGB 13.5 05/25/2014   HCT 41.6 05/25/2014   MCV 88 05/25/2014   PLT 259 05/25/2014    Results for orders placed or performed in visit on 02/09/15  CoaguChek XS/INR Waived (STAT)  Result Value Ref Range   INR 2.5 (H) 0.9 - 1.1   Prothrombin Time 30.4 sec  Comprehensive metabolic panel  Result Value Ref Range   Glucose 116 (H) 65 - 99 mg/dL   BUN 15 8 - 27 mg/dL   Creatinine, Ser 0.92 0.76 - 1.27 mg/dL   GFR calc non Af Amer 90 >59 mL/min/1.73   GFR calc Af Amer 104 >59 mL/min/1.73   BUN/Creatinine Ratio 16 10 - 22   Sodium 141 134 - 144 mmol/L   Potassium 4.7 3.5 - 5.2 mmol/L   Chloride 102 97 - 108 mmol/L   CO2 24 18 - 29 mmol/L   Calcium 8.8 8.6 - 10.2 mg/dL   Total Protein 6.7 6.0 - 8.5 g/dL   Albumin 4.0  3.6 - 4.8 g/dL   Globulin, Total 2.7 1.5 - 4.5 g/dL   Albumin/Globulin Ratio 1.5 1.1 - 2.5   Bilirubin Total 0.3 0.0 - 1.2 mg/dL   Alkaline Phosphatase 47 39 - 117 IU/L   AST 14 0 - 40 IU/L   ALT 17 0 - 44 IU/L  Lipid Panel Piccolo, Waived  Result Value Ref Range   Cholesterol Piccolo, Waived 161 <200 mg/dL   HDL Chol Piccolo, Waived 41 (L) >59 mg/dL   Triglycerides Piccolo,Waived 284 (H) <150 mg/dL   Chol/HDL Ratio Piccolo,Waive 3.9 mg/dL   LDL Chol Calc Piccolo Waived 63 <100 mg/dL   VLDL Chol Calc Piccolo,Waive 57 (H) <30 mg/dL  Hepatitis C antibody  Result Value Ref Range   Hep C Virus Ab 0.3 0.0 - 0.9 s/co ratio       Assessment:     ICD-9-CM ICD-10-CM   1. Heart valve replaced V43.3 Z95.4 CoaguChek XS/INR Waived    Plan:   Discussed current plan face-to-face with patient. For coumadin dosing, elected to continue current dose. Will plan to recheck INR in 1 month.    Review of Systems  Physical Exam

## 2015-04-12 ENCOUNTER — Other Ambulatory Visit: Payer: Self-pay | Admitting: Family Medicine

## 2015-04-12 DIAGNOSIS — Z952 Presence of prosthetic heart valve: Secondary | ICD-10-CM

## 2015-04-13 ENCOUNTER — Ambulatory Visit (INDEPENDENT_AMBULATORY_CARE_PROVIDER_SITE_OTHER): Payer: Self-pay | Admitting: Family Medicine

## 2015-04-13 ENCOUNTER — Ambulatory Visit: Payer: Self-pay | Admitting: Family Medicine

## 2015-04-13 ENCOUNTER — Encounter: Payer: Self-pay | Admitting: Family Medicine

## 2015-04-13 DIAGNOSIS — Z954 Presence of other heart-valve replacement: Secondary | ICD-10-CM

## 2015-04-13 DIAGNOSIS — Z952 Presence of prosthetic heart valve: Secondary | ICD-10-CM

## 2015-04-13 LAB — COAGUCHEK XS/INR WAIVED
INR: 2.1 — ABNORMAL HIGH (ref 0.9–1.1)
PROTHROMBIN TIME: 25.4 s

## 2015-04-13 NOTE — Progress Notes (Signed)
   Subjective:    Patient ID: Roberto [name], male    DOB: 30-Jan-1956, 59 y.o.   MRN: [id number]   BP 139/84 mmHg  Pulse 98  Temp(Src) 96.7 F (35.9 C)  Ht 5' 4.6" (1.641 m)  Wt 322 lb (146.058 kg)  BMI 54.24 kg/m2   Subjective:    Patient ID: Roberto [name], male    DOB: [birth year], 59 y.o.   MRN: [id number]  CC: Coumadin management  HPI: This patient is a 59 y.o. male who presents for coumadin management. The expected duration of coumadin treatment is lifelong The reason for anticoagulation is  mechanical heart valve.  Present Coumadin dose:alternates 4mg ,4mg  and 7.5mg  Goal: 2.5-3.5  Excessive bruising: no Nose bleeding: no Rectal bleeding: no Prolonged menstrual cycles: N/A Eating diet with consistent amounts of foods containing Vitamin K:no- has been eating a lot of macaroni salad and thinks that that made him go low because everything else has been about the same.  Any recent antibiotic use? no  Relevant past medical, surgical, family and social history reviewed and updated as indicated. Interim medical history since our last visit reviewed. Allergies and medications reviewed and updated.  ROS: Per HPI unless specifically indicated above     Objective:    BP 139/84 mmHg  Pulse 98  Temp(Src) 96.7 F (35.9 C)  Ht 5' 4.6" (1.641 m)  Wt 322 lb (146.058 kg)  BMI 54.24 kg/m2  Wt Readings from Last 3 Encounters:  04/13/15 322 lb (146.058 kg)  03/15/15 318 lb (144.244 kg)  02/09/15 318 lb 12.8 oz (144.607 kg)     General: Well appearing, well nourished in no distress.  Normal mood and affect. Skin: No excessive bruising or rash  Last INR: 2.1 PT: 25.4   Last CBC:  Lab Results  Component Value Date   WBC 8.3 05/25/2014   HGB 13.5 05/25/2014   HCT 41.6 05/25/2014   MCV 88 05/25/2014   PLT 259 05/25/2014    Results for orders placed or performed in visit on 03/15/15  CoaguChek XS/INR Waived  Result Value Ref Range   INR 2.8 (H) 0.9 - 1.1   Prothrombin  Time 33.1 sec       Assessment:     ICD-9-CM ICD-10-CM   1. Mechanical heart valve present V43.3 Z95.4 CoaguChek XS/INR Waived    Plan:   Discussed current plan face-to-face with patient. He is a bit low from what he should be, but thinks that it is due to diet. For coumadin dosing, elected to continue current dose. He will really watch his diet. We will plan to recheck INR in 2 weeks and if still low, we will adjust dose.   HPI    Review of Systems    Physical Exam

## 2015-04-26 ENCOUNTER — Encounter: Payer: Self-pay | Admitting: Family Medicine

## 2015-04-26 ENCOUNTER — Ambulatory Visit (INDEPENDENT_AMBULATORY_CARE_PROVIDER_SITE_OTHER): Payer: Self-pay | Admitting: Family Medicine

## 2015-04-26 DIAGNOSIS — Z952 Presence of prosthetic heart valve: Secondary | ICD-10-CM

## 2015-04-26 DIAGNOSIS — Z954 Presence of other heart-valve replacement: Secondary | ICD-10-CM | POA: Diagnosis not present

## 2015-04-26 LAB — COAGUCHEK XS/INR WAIVED
INR: 2.3 — AB (ref 0.9–1.1)
PROTHROMBIN TIME: 27.9 s

## 2015-04-26 MED ORDER — WARFARIN SODIUM 4 MG PO TABS: 4.0000 mg | ORAL_TABLET | Freq: Every day | ORAL | Status: DC

## 2015-04-26 NOTE — Progress Notes (Signed)
HPI   BP 132/82 mmHg  Pulse 62  Temp(Src) 98.3 F (36.8 C)  Ht 5' 4.6" (1.641 m)  Wt 325 lb (147.419 kg)  BMI 54.74 kg/m2  SpO2 99%   Subjective:    Patient ID: Roberto [name], male    DOB: 04-12-1956, 59 y.o.   MRN: [id number]  CC: Coumadin management  HPI: This patient is a 59 y.o. male who presents for coumadin management. The expected duration of coumadin treatment is lifelong The reason for anticoagulation is  mechanical heart valve.  Present Coumadin dose:4mg ,4mg ,7.5mg  repeat Goal: 2.5-3.5  Excessive bruising: no Nose bleeding: no Rectal bleeding: no Prolonged menstrual cycles: N/A Eating diet with consistent amounts of foods containing Vitamin K:yes Any recent antibiotic use? no  Relevant past medical, surgical, family and social history reviewed and updated as indicated. Interim medical history since our last visit reviewed. Allergies and medications reviewed and updated.  ROS: Per HPI unless specifically indicated above     Objective:    BP 132/82 mmHg  Pulse 62  Temp(Src) 98.3 F (36.8 C)  Ht 5' 4.6" (1.641 m)  Wt 325 lb (147.419 kg)  BMI 54.74 kg/m2  SpO2 99%  Wt Readings from Last 3 Encounters:  04/26/15 325 lb (147.419 kg)  04/13/15 322 lb (146.058 kg)  03/15/15 318 lb (144.244 kg)     General: Well appearing, well nourished in no distress.  Normal mood and affect. Skin: No excessive bruising or rash  Last INR: 2.3 PT: 27.9    Last CBC:  Lab Results  Component Value Date   WBC 8.3 05/25/2014   HGB 13.5 05/25/2014   HCT 41.6 05/25/2014   MCV 88 05/25/2014   PLT 259 05/25/2014    Results for orders placed or performed in visit on 04/13/15  CoaguChek XS/INR Waived  Result Value Ref Range   INR 2.1 (H) 0.9 - 1.1   Prothrombin Time 25.4 sec       Assessment:     ICD-9-CM ICD-10-CM   1. Mechanical heart valve present V43.3 Z95.4 CoaguChek XS/INR Waived    Plan:   Discussed current plan face-to-face with patient. For coumadin  dosing, elected to change dose to 4mg , 4mg , 8mg , repeat. Will plan to recheck INR in 2 weeks.   Review of Systems     Physical Exam

## 2015-04-26 NOTE — Patient Instructions (Signed)
4mg , 4mg , 8 mg, repeat

## 2015-05-11 ENCOUNTER — Encounter: Payer: Self-pay | Admitting: Family Medicine

## 2015-05-11 ENCOUNTER — Ambulatory Visit (INDEPENDENT_AMBULATORY_CARE_PROVIDER_SITE_OTHER): Payer: Medicare Other | Admitting: Family Medicine

## 2015-05-11 VITALS — BP 125/84 | HR 74 | Temp 96.1°F | Wt 330.0 lb

## 2015-05-11 DIAGNOSIS — Z954 Presence of other heart-valve replacement: Secondary | ICD-10-CM

## 2015-05-11 DIAGNOSIS — Z952 Presence of prosthetic heart valve: Secondary | ICD-10-CM

## 2015-05-11 LAB — COAGUCHEK XS/INR WAIVED
INR: 2.3 — ABNORMAL HIGH (ref 0.9–1.1)
PROTHROMBIN TIME: 27 s

## 2015-05-11 MED ORDER — WARFARIN SODIUM 4 MG PO TABS: 4.0000 mg | ORAL_TABLET | Freq: Every day | ORAL | Status: DC

## 2015-05-11 NOTE — Progress Notes (Signed)
BP 125/84 mmHg  Pulse 74  Temp(Src) 96.1 F (35.6 C)  Wt 330 lb (149.687 kg)  SpO2 99%   Subjective:    Patient ID: Roberto [name], male    DOB: 1956-01-31, 59 y.o.   MRN: [id number]  CC: Coumadin management  HPI: This patient is a 59 y.o. male who presents for coumadin management. The expected duration of coumadin treatment is lifelong The reason for anticoagulation is  mechanical heart valve.  Present Coumadin dose: 4mg , 4mg , 8mg , repeat Goal: 2.5-3.5  Excessive bruising: no Nose bleeding: no Rectal bleeding: no Prolonged menstrual cycles: N/A Eating diet with consistent amounts of foods containing Vitamin K:yes Any recent antibiotic use? no  Relevant past medical, surgical, family and social history reviewed and updated as indicated. Interim medical history since our last visit reviewed. Allergies and medications reviewed and updated.  ROS: Per HPI unless specifically indicated above     Objective:    BP 125/84 mmHg  Pulse 74  Temp(Src) 96.1 F (35.6 C)  Wt 330 lb (149.687 kg)  SpO2 99%  Wt Readings from Last 3 Encounters:  05/11/15 330 lb (149.687 kg)  04/26/15 325 lb (147.419 kg)  04/13/15 322 lb (146.058 kg)     General: Well appearing, well nourished in no distress.  Normal mood and affect. Skin: No excessive bruising or rash  Last INR: 2.3 Last PT: 27.0    Last CBC:  Lab Results  Component Value Date   WBC 8.3 05/25/2014   HGB 13.5 05/25/2014   HCT 41.6 05/25/2014   MCV 88 05/25/2014   PLT 259 05/25/2014    Results for orders placed or performed in visit on 04/26/15  CoaguChek XS/INR Waived  Result Value Ref Range   INR 2.3 (H) 0.9 - 1.1   Prothrombin Time 27.9 sec       Assessment:     ICD-9-CM ICD-10-CM   1. Heart valve replaced V43.3 Z95.4 CoaguChek XS/INR Waived    Plan:   Discussed current plan face-to-face with patient. For coumadin dosing, elected to continue current dose. Will plan to recheck INR in 1 week on a day after 8mg   dose.

## 2015-05-19 ENCOUNTER — Other Ambulatory Visit: Payer: Self-pay | Admitting: Family Medicine

## 2015-05-19 ENCOUNTER — Ambulatory Visit (INDEPENDENT_AMBULATORY_CARE_PROVIDER_SITE_OTHER): Payer: Medicare Other | Admitting: Family Medicine

## 2015-05-19 ENCOUNTER — Encounter: Payer: Self-pay | Admitting: Family Medicine

## 2015-05-19 DIAGNOSIS — Z952 Presence of prosthetic heart valve: Secondary | ICD-10-CM

## 2015-05-19 DIAGNOSIS — Z954 Presence of other heart-valve replacement: Secondary | ICD-10-CM | POA: Diagnosis not present

## 2015-05-19 LAB — COAGUCHEK XS/INR WAIVED
INR: 2.6 — ABNORMAL HIGH (ref 0.9–1.1)
Prothrombin Time: 30.7 s

## 2015-05-19 NOTE — Progress Notes (Signed)
   BP 136/84 mmHg  Pulse 72  Temp(Src) 97.8 F (36.6 C)  Wt 326 lb (147.873 kg)  SpO2 97%   Subjective:    Patient ID: Roberto [name], male    DOB: 01-Aug-1955, 59 y.o.   MRN: [id number]  CC: Coumadin management  HPI: This patient is a 59 y.o. male who presents for coumadin management. The expected duration of coumadin treatment is lifelong The reason for anticoagulation is  mechanical heart valve.  Present Coumadin dose: 4mg , 4mg , 8mg  repeat Goal: 2.5-3.5  Excessive bruising: no Nose bleeding: no Rectal bleeding: no Prolonged menstrual cycles: N/A Eating diet with consistent amounts of foods containing Vitamin K:yes Any recent antibiotic use? no  Relevant past medical, surgical, family and social history reviewed and updated as indicated. Interim medical history since our last visit reviewed. Allergies and medications reviewed and updated.  ROS: Per HPI unless specifically indicated above     Objective:    BP 136/84 mmHg  Pulse 72  Temp(Src) 97.8 F (36.6 C)  Wt 326 lb (147.873 kg)  SpO2 97%  Wt Readings from Last 3 Encounters:  05/19/15 326 lb (147.873 kg)  05/11/15 330 lb (149.687 kg)  04/26/15 325 lb (147.419 kg)     General: Well appearing, well nourished in no distress.  Normal mood and affect. Skin: No excessive bruising or rash  Last INR: 2.6 Last PT: 30.7    Last CBC:  Lab Results  Component Value Date   WBC 8.3 05/25/2014   HGB 13.5 05/25/2014   HCT 41.6 05/25/2014   MCV 88 05/25/2014   PLT 259 05/25/2014    Results for orders placed or performed in visit on 05/11/15  CoaguChek XS/INR Waived  Result Value Ref Range   INR 2.3 (H) 0.9 - 1.1   Prothrombin Time 27.0 sec       Assessment:     ICD-9-CM ICD-10-CM   1. Mechanical heart valve present V43.3 Z95.4 CoaguChek XS/INR Waived    Plan:   Discussed current plan face-to-face with patient. For coumadin dosing, elected to continue current dose. Will plan to recheck INR in 1 month.

## 2015-06-06 ENCOUNTER — Other Ambulatory Visit: Payer: Self-pay | Admitting: Family Medicine

## 2015-06-18 ENCOUNTER — Ambulatory Visit (INDEPENDENT_AMBULATORY_CARE_PROVIDER_SITE_OTHER): Payer: Self-pay | Admitting: Family Medicine

## 2015-06-18 ENCOUNTER — Other Ambulatory Visit: Payer: Self-pay | Admitting: Family Medicine

## 2015-06-18 ENCOUNTER — Encounter: Payer: Self-pay | Admitting: Family Medicine

## 2015-06-18 VITALS — BP 137/86 | HR 66 | Temp 98.6°F | Ht 65.1 in | Wt 331.8 lb

## 2015-06-18 DIAGNOSIS — Z954 Presence of other heart-valve replacement: Secondary | ICD-10-CM | POA: Diagnosis not present

## 2015-06-18 DIAGNOSIS — Z952 Presence of prosthetic heart valve: Secondary | ICD-10-CM

## 2015-06-18 DIAGNOSIS — R791 Abnormal coagulation profile: Secondary | ICD-10-CM

## 2015-06-18 LAB — COAGUCHEK XS/INR WAIVED
INR: 6.5 (ref 0.9–1.1)
Prothrombin Time: 77.4 s

## 2015-06-18 NOTE — Progress Notes (Signed)
BP 137/86 mmHg  Pulse 66  Temp(Src) 98.6 F (37 C)  Ht 5' 5.1" (1.654 m)  Wt 331 lb 12.8 oz (150.503 kg)  BMI 55.01 kg/m2  SpO2 96%   Subjective:    Patient ID: Roberto [name], male    DOB: Dec 22, 1955, 59 y.o.   MRN: [id number]  CC: Coumadin management  HPI: This patient is a 59 y.o. male who presents for coumadin management. The expected duration of coumadin treatment is lifelong The reason for anticoagulation is  mechanical heart valve.  Present Coumadin dose: 2 days of 4, 1 day of 8 Goal: 2.5-3.5  Excessive bruising: no Nose bleeding: no Rectal bleeding: no Prolonged menstrual cycles: N/A Eating diet with consistent amounts of foods containing Vitamin K:no Any recent antibiotic use? no  Relevant past medical, surgical, family and social history reviewed and updated as indicated. Interim medical history since our last visit reviewed. Allergies and medications reviewed and updated.  ROS: Per HPI unless specifically indicated above     Objective:    BP 137/86 mmHg  Pulse 66  Temp(Src) 98.6 F (37 C)  Ht 5' 5.1" (1.654 m)  Wt 331 lb 12.8 oz (150.503 kg)  BMI 55.01 kg/m2  SpO2 96%  Wt Readings from Last 3 Encounters:  06/18/15 331 lb 12.8 oz (150.503 kg)  05/19/15 326 lb (147.873 kg)  05/11/15 330 lb (149.687 kg)     General: Well appearing, well nourished in no distress.  Normal mood and affect. Skin: No excessive bruising or rash  Last INR:  6.2 Last PT: 74.8    Last CBC:  Lab Results  Component Value Date   WBC 8.3 05/25/2014   HGB 13.5 05/25/2014   HCT 41.6 05/25/2014   MCV 88 05/25/2014   PLT 259 05/25/2014    Results for orders placed or performed in visit on 05/19/15  CoaguChek XS/INR Waived  Result Value Ref Range   INR 2.6 (H) 0.9 - 1.1   Prothrombin Time 30.7 sec       Assessment:     ICD-9-CM ICD-10-CM   1. Supratherapeutic INR 790.92 R79.1   2. Mechanical heart valve present V43.3 Z95.4 CoaguChek XS/INR Waived    Plan:    Discussed current plan face-to-face with patient. For coumadin dosing, elected to hold dose for 2 days. Will plan to recheck INR on Monday. Warning signs for which to go to the ER discussed.

## 2015-06-21 ENCOUNTER — Encounter: Payer: Self-pay | Admitting: Family Medicine

## 2015-06-21 ENCOUNTER — Ambulatory Visit (INDEPENDENT_AMBULATORY_CARE_PROVIDER_SITE_OTHER): Payer: Self-pay | Admitting: Family Medicine

## 2015-06-21 DIAGNOSIS — R791 Abnormal coagulation profile: Secondary | ICD-10-CM | POA: Diagnosis not present

## 2015-06-21 DIAGNOSIS — Z954 Presence of other heart-valve replacement: Secondary | ICD-10-CM

## 2015-06-21 DIAGNOSIS — Z952 Presence of prosthetic heart valve: Secondary | ICD-10-CM

## 2015-06-21 LAB — COAGUCHEK XS/INR WAIVED
INR: 3.2 — AB (ref 0.9–1.1)
PROTHROMBIN TIME: 39.4 s

## 2015-06-21 NOTE — Progress Notes (Signed)
   BP 126/86 mmHg  Pulse 69  Temp(Src) 98.8 F (37.1 C)  Ht 5' 5.1" (1.654 m)  Wt 331 lb (150.141 kg)  BMI 54.88 kg/m2  SpO2 95%   Subjective:    Patient ID: Roberto [name], male    DOB: June 17, 1956, 59 y.o.   MRN: [id number]  HPI: [name] is a 59 y.o. male  Chief Complaint  Patient presents with  . mechanical heart valve   with no bruising bleeding issues no concerns taking medications faithfully On directions given last week and doing well taking medications without problems Relevant past medical, surgical, family and social history reviewed and updated as indicated. Interim medical history since our last visit reviewed. Allergies and medications reviewed and updated.  Review of Systems  Per HPI unless specifically indicated above     Objective:    BP 126/86 mmHg  Pulse 69  Temp(Src) 98.8 F (37.1 C)  Ht 5' 5.1" (1.654 m)  Wt 331 lb (150.141 kg)  BMI 54.88 kg/m2  SpO2 95%  Wt Readings from Last 3 Encounters:  06/21/15 331 lb (150.141 kg)  06/18/15 331 lb 12.8 oz (150.503 kg)  05/19/15 326 lb (147.873 kg)    Physical Exam  Cardiovascular: Regular rhythm.   Pulmonary/Chest: Breath sounds normal.    Results for orders placed or performed in visit on 06/18/15  CoaguChek XS/INR Waived  Result Value Ref Range   INR 6.5 (>) 0.9 - 1.1   Prothrombin Time 77.4 sec      Assessment & Plan:   Problem List Items Addressed This Visit      Cardiovascular and Mediastinum   Mechanical heart valve present    We'll continue current warfarin therapy recheck INR 2 weeks       Other Visit Diagnoses    Supratherapeutic INR          resolved  Follow up plan: Return in about 2 weeks (around 07/05/2015) for PT INR.

## 2015-06-21 NOTE — Assessment & Plan Note (Signed)
We'll continue current warfarin therapy recheck INR 2 weeks

## 2015-07-04 DIAGNOSIS — N280 Ischemia and infarction of kidney: Secondary | ICD-10-CM

## 2015-07-04 HISTORY — DX: Ischemia and infarction of kidney: N28.0

## 2015-07-06 ENCOUNTER — Ambulatory Visit (INDEPENDENT_AMBULATORY_CARE_PROVIDER_SITE_OTHER): Payer: Self-pay | Admitting: Family Medicine

## 2015-07-06 ENCOUNTER — Encounter: Payer: Self-pay | Admitting: Family Medicine

## 2015-07-06 VITALS — BP 133/85 | HR 79 | Temp 96.6°F | Ht 64.6 in | Wt 333.0 lb

## 2015-07-06 DIAGNOSIS — Z954 Presence of other heart-valve replacement: Secondary | ICD-10-CM | POA: Diagnosis not present

## 2015-07-06 DIAGNOSIS — Z952 Presence of prosthetic heart valve: Secondary | ICD-10-CM

## 2015-07-06 LAB — COAGUCHEK XS/INR WAIVED
INR: 2.7 — AB (ref 0.9–1.1)
PROTHROMBIN TIME: 32.6 s

## 2015-07-06 NOTE — Progress Notes (Signed)
BP 133/85 mmHg  Pulse 79  Temp(Src) 96.6 F (35.9 C)  Ht 5' 4.6" (1.641 m)  Wt 333 lb (151.048 kg)  BMI 56.09 kg/m2  SpO2 96%   Subjective:    Patient ID: Roberto [name], male    DOB: [birth year], 60 y.o.   MRN: [id number]  CC: Coumadin management  HPI: This patient is a 60 y.o. male who presents for coumadin management. The expected duration of coumadin treatment is lifelong The reason for anticoagulation is  mechanical heart valve.  Present Coumadin dose: 4mg , 4mg , 8mg  repeat Goal: 2.5-3.5  Excessive bruising: no Nose bleeding: no Rectal bleeding: no Prolonged menstrual cycles: N/A Eating diet with consistent amounts of foods containing Vitamin K:yes Any recent antibiotic use? no  Relevant past medical, surgical, family and social history reviewed and updated as indicated. Interim medical history since our last visit reviewed. Allergies and medications reviewed and updated.  ROS: Per HPI unless specifically indicated above     Objective:    BP 133/85 mmHg  Pulse 79  Temp(Src) 96.6 F (35.9 C)  Ht 5' 4.6" (1.641 m)  Wt 333 lb (151.048 kg)  BMI 56.09 kg/m2  SpO2 96%  Wt Readings from Last 3 Encounters:  07/06/15 333 lb (151.048 kg)  06/21/15 331 lb (150.141 kg)  06/18/15 331 lb 12.8 oz (150.503 kg)     General: Well appearing, well nourished in no distress.  Normal mood and affect. Skin: No excessive bruising or rash  Last INR: 2.7 PT: 32.6    Last CBC:  Lab Results  Component Value Date   WBC 8.3 05/25/2014   HGB 13.5 05/25/2014   HCT 41.6 05/25/2014   MCV 88 05/25/2014   PLT 259 05/25/2014    Results for orders placed or performed in visit on 06/21/15  CoaguChek XS/INR Waived  Result Value Ref Range   INR 3.2 (H) 0.9 - 1.1   Prothrombin Time 39.4 sec       Assessment:     ICD-9-CM ICD-10-CM   1. Mechanical heart valve present V43.3 Z95.4 CoaguChek XS/INR Waived    Plan:   Discussed current plan face-to-face with patient. For coumadin  dosing, elected to continue current dose. Will plan to recheck INR in 1 month.

## 2015-07-19 ENCOUNTER — Encounter: Payer: Self-pay | Admitting: Unknown Physician Specialty

## 2015-07-28 ENCOUNTER — Telehealth: Payer: Self-pay | Admitting: Family Medicine

## 2015-07-28 NOTE — Telephone Encounter (Signed)
Patient girlfriend called stating that they gave patient the wrong doze of medication and don't know what to do. Can you or Dr. Wynetta Emery call them back regarding this issue, thanks. The medication is lisinopril (PRINIVIL,ZESTRIL) 10 MG tablet

## 2015-07-28 NOTE — Telephone Encounter (Signed)
He's been on 10mg  of lisinopril since August- I haven't given him any refills, can you check with them/pharmacy and see what's going on?

## 2015-07-28 NOTE — Telephone Encounter (Signed)
Called and left a message for patient. He is to be taking Lisinopril 10mg  since August, unsure of what is going on, told patient to return my call.

## 2015-07-28 NOTE — Telephone Encounter (Signed)
Dr.[name], did you change his dose of lisinopril?

## 2015-07-29 MED ORDER — LISINOPRIL 2.5 MG PO TABS: 2.5000 mg | ORAL_TABLET | Freq: Every day | ORAL | Status: DC

## 2015-07-29 NOTE — Telephone Encounter (Signed)
Rx sent to his pharmacy

## 2015-07-29 NOTE — Telephone Encounter (Signed)
Spoke with patients girlfriend, he has been on 2.5mg  the whole time, they just picked up the 10mg  yesterday at [facility], patient is concerned about this, he did not know that the dose was to be increased and his BP has been doing well.So does he need to be on the 10mg  or the 2.5mg ?

## 2015-07-29 NOTE — Telephone Encounter (Signed)
Please send 2.5mg  to pharmacy

## 2015-07-29 NOTE — Telephone Encounter (Signed)
Stay on the 2.5. Please have him bring his medication bottles to his next visit.

## 2015-08-06 ENCOUNTER — Ambulatory Visit: Payer: Self-pay | Admitting: Family Medicine

## 2015-08-11 ENCOUNTER — Encounter: Payer: Self-pay | Admitting: Family Medicine

## 2015-08-11 ENCOUNTER — Ambulatory Visit (INDEPENDENT_AMBULATORY_CARE_PROVIDER_SITE_OTHER): Payer: Self-pay | Admitting: Family Medicine

## 2015-08-11 VITALS — BP 134/91 | HR 67 | Temp 98.1°F | Ht 65.5 in | Wt 334.6 lb

## 2015-08-11 DIAGNOSIS — Z952 Presence of prosthetic heart valve: Secondary | ICD-10-CM

## 2015-08-11 DIAGNOSIS — Z954 Presence of other heart-valve replacement: Secondary | ICD-10-CM

## 2015-08-11 LAB — COAGUCHEK XS/INR WAIVED
INR: 3.1 — AB (ref 0.9–1.1)
PROTHROMBIN TIME: 37.1 s

## 2015-08-11 NOTE — Progress Notes (Signed)
   BP 134/91 mmHg  Pulse 67  Temp(Src) 98.1 F (36.7 C)  Ht 5' 5.5" (1.664 m)  Wt 334 lb 9.6 oz (151.774 kg)  BMI 54.81 kg/m2  SpO2 96%   Subjective:    Patient ID: Roberto [name], male    DOB: May 24, 1956, 60 y.o.   MRN: [id number]  CC: Coumadin management  HPI: This patient is a 60 y.o. male who presents for coumadin management. The expected duration of coumadin treatment is lifelong The reason for anticoagulation is  mechanical heart valve.  Present Coumadin dose: 4mg , 4mg  8mg  repeat Goal: 2.5-3.5  Excessive bruising: no Nose bleeding: no Rectal bleeding: no Prolonged menstrual cycles: N/A Eating diet with consistent amounts of foods containing Vitamin K:yes Any recent antibiotic use? no  Relevant past medical, surgical, family and social history reviewed and updated as indicated. Interim medical history since our last visit reviewed. Allergies and medications reviewed and updated.  ROS: Per HPI unless specifically indicated above     Objective:    BP 134/91 mmHg  Pulse 67  Temp(Src) 98.1 F (36.7 C)  Ht 5' 5.5" (1.664 m)  Wt 334 lb 9.6 oz (151.774 kg)  BMI 54.81 kg/m2  SpO2 96%  Wt Readings from Last 3 Encounters:  08/11/15 334 lb 9.6 oz (151.774 kg)  07/06/15 333 lb (151.048 kg)  06/21/15 331 lb (150.141 kg)     General: Well appearing, well nourished in no distress.  Normal mood and affect. Skin: No excessive bruising or rash  Last INR: 3.1 Last PT: 37.1    Last CBC:  Lab Results  Component Value Date   WBC 8.3 05/25/2014   HGB 13.5 05/25/2014   HCT 41.6 05/25/2014   MCV 88 05/25/2014   PLT 259 05/25/2014    Results for orders placed or performed in visit on 07/06/15  CoaguChek XS/INR Waived  Result Value Ref Range   INR 2.7 (H) 0.9 - 1.1   Prothrombin Time 32.6 sec       Assessment:     ICD-9-CM ICD-10-CM   1. Mechanical heart valve present V43.3 Z95.4 CoaguChek XS/INR Waived    Plan:   Discussed current plan face-to-face with  patient. For coumadin dosing, elected to continue current dose. Will plan to recheck INR in 1 month.

## 2015-08-23 ENCOUNTER — Telehealth: Payer: Self-pay | Admitting: Family Medicine

## 2015-08-23 NOTE — Telephone Encounter (Signed)
Routing to Katina 

## 2015-08-23 NOTE — Telephone Encounter (Signed)
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

## 2015-08-25 NOTE — Telephone Encounter (Signed)
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 She states that she is trying to be proactive to be sure that he has all of the information he needs before he comes in for his physical. She also does not want him to spend money on a physical if he is going to be disqualified. Please advise.

## 2015-08-26 NOTE — Telephone Encounter (Signed)
The coumadin won't disqualify him.

## 2015-08-26 NOTE — Telephone Encounter (Signed)
Contacted Laban Emperor to advise that coumadin will not disqualify Roberto [name]'s DOT physical. LMOM for Ms. Norman Herrlich to return my call.

## 2015-09-02 ENCOUNTER — Ambulatory Visit (INDEPENDENT_AMBULATORY_CARE_PROVIDER_SITE_OTHER): Payer: Self-pay | Admitting: Family Medicine

## 2015-09-02 ENCOUNTER — Encounter: Payer: Self-pay | Admitting: Family Medicine

## 2015-09-02 ENCOUNTER — Other Ambulatory Visit: Payer: Self-pay | Admitting: Family Medicine

## 2015-09-02 DIAGNOSIS — Z952 Presence of prosthetic heart valve: Secondary | ICD-10-CM

## 2015-09-02 DIAGNOSIS — Z954 Presence of other heart-valve replacement: Secondary | ICD-10-CM | POA: Diagnosis not present

## 2015-09-02 LAB — COAGUCHEK XS/INR WAIVED
INR: 2.7 — AB (ref 0.9–1.1)
PROTHROMBIN TIME: 32 s

## 2015-09-02 NOTE — Progress Notes (Signed)
BP 132/70 mmHg  Pulse 67  Temp(Src) 98.5 F (36.9 C)  Ht 5' 5.3" (1.659 m)  Wt 338 lb (153.316 kg)  BMI 55.71 kg/m2  SpO2 94%   Subjective:    Patient ID: Roberto [name], male    DOB: 04/09/1956, 60 y.o.   MRN: [id number]  CC: Coumadin management  HPI: This patient is a 59 y.o. male who presents for coumadin management. The expected duration of coumadin treatment is lifelong The reason for anticoagulation is  mechanical heart valve.  Present Coumadin dose: 4mg , 4mg , 8mg  repeat Goal: 2.5-3.5  Excessive bruising: no Nose bleeding: no Rectal bleeding: no Prolonged menstrual cycles: N/A Eating diet with consistent amounts of foods containing Vitamin K:yes Any recent antibiotic use? no  Relevant past medical, surgical, family and social history reviewed and updated as indicated. Interim medical history since our last visit reviewed. Allergies and medications reviewed and updated.  ROS: Per HPI unless specifically indicated above     Objective:    BP 132/70 mmHg  Pulse 67  Temp(Src) 98.5 F (36.9 C)  Ht 5' 5.3" (1.659 m)  Wt 338 lb (153.316 kg)  BMI 55.71 kg/m2  SpO2 94%  Wt Readings from Last 3 Encounters:  09/02/15 338 lb (153.316 kg)  08/11/15 334 lb 9.6 oz (151.774 kg)  07/06/15 333 lb (151.048 kg)     General: Well appearing, well nourished in no distress.  Normal mood and affect. Skin: No excessive bruising or rash  Last INR: 2.7 Last PT: 32.0    Last CBC:  Lab Results  Component Value Date   WBC 8.3 05/25/2014   HGB 13.5 05/25/2014   HCT 41.6 05/25/2014   MCV 88 05/25/2014   PLT 259 05/25/2014    Results for orders placed or performed in visit on 08/11/15  CoaguChek XS/INR Waived  Result Value Ref Range   INR 3.1 (H) 0.9 - 1.1   Prothrombin Time 37.1 sec       Assessment:     ICD-9-CM ICD-10-CM   1. Mechanical heart valve present V43.3 Z95.4 CoaguChek XS/INR Waived    Plan:   Discussed current plan face-to-face with patient. For  coumadin dosing, elected to continue current dose. Will plan to recheck INR in 1 month.

## 2015-09-03 ENCOUNTER — Telehealth: Payer: Self-pay | Admitting: Family Medicine

## 2015-09-03 MED ORDER — PREDNISONE 50 MG PO TABS: 50.0000 mg | ORAL_TABLET | Freq: Every day | ORAL | Status: DC

## 2015-09-03 MED ORDER — ALBUTEROL SULFATE HFA 108 (90 BASE) MCG/ACT IN AERS: 2.0000 | INHALATION_SPRAY | Freq: Four times a day (QID) | RESPIRATORY_TRACT | Status: DC | PRN

## 2015-09-03 NOTE — Telephone Encounter (Signed)
Pt's girlfriend called stated pt is having a hard time today, stated she believes he has an upper respiratory issue. Stated Dr. Wynetta Emery told him to call if he had issues to call, pt stated he does not have a rescue inhaler. Pt would like to know if something can be sent in for him. Pt has a valve replacement. Pharm is Paediatric nurse on [facility]. Pt feels and sounds horrible. Complaining of pain all over. Thanks.

## 2015-09-03 NOTE — Telephone Encounter (Signed)
Inhaler and prednisone sent to his pharmacy. Can we get him in on Tuesday so I can see if he needs an antibiotic please.

## 2015-09-03 NOTE — Telephone Encounter (Signed)
Patients girlfriend notified. Appointment scheduled for next week.

## 2015-09-06 ENCOUNTER — Ambulatory Visit: Payer: Self-pay | Admitting: Family Medicine

## 2015-09-06 ENCOUNTER — Encounter: Payer: Self-pay | Admitting: Family Medicine

## 2015-09-08 ENCOUNTER — Encounter: Payer: Self-pay | Admitting: Unknown Physician Specialty

## 2015-09-09 ENCOUNTER — Encounter: Payer: Self-pay | Admitting: Family Medicine

## 2015-09-09 ENCOUNTER — Ambulatory Visit (INDEPENDENT_AMBULATORY_CARE_PROVIDER_SITE_OTHER): Payer: Self-pay | Admitting: Family Medicine

## 2015-09-09 VITALS — BP 136/88 | HR 82 | Temp 98.2°F | Ht 65.1 in | Wt 338.0 lb

## 2015-09-09 DIAGNOSIS — J449 Chronic obstructive pulmonary disease, unspecified: Secondary | ICD-10-CM | POA: Insufficient documentation

## 2015-09-09 DIAGNOSIS — J441 Chronic obstructive pulmonary disease with (acute) exacerbation: Secondary | ICD-10-CM

## 2015-09-09 MED ORDER — PREDNISONE 50 MG PO TABS: ORAL_TABLET | ORAL | Status: DC

## 2015-09-09 MED ORDER — AZITHROMYCIN 250 MG PO TABS: ORAL_TABLET | ORAL | Status: DC

## 2015-09-09 NOTE — Assessment & Plan Note (Addendum)
Exacerbation not completely resolved. 6 day prednisone taper and z-pack. Call with any concerns or if not getting better. Due to come back for regular appointment 3/30.

## 2015-09-09 NOTE — Progress Notes (Signed)
BP 136/88 mmHg  Pulse 82  Temp(Src) 98.2 F (36.8 C)  Ht 5' 5.1" (1.654 m)  Wt 338 lb (153.316 kg)  BMI 56.04 kg/m2  SpO2 94%   Subjective:    Patient ID: Roberto [name], male    DOB: 1956-03-29, 60 y.o.   MRN: [id number]  HPI: Roberto [name] is a 60 y.o. male  Chief Complaint  Patient presents with  . COPD   COPD COPD status: States some aspects are better like wheezing and cough. SOB is worse.   Satisfied with current treatment?: yes Oxygen use: no Dyspnea frequency: with moderate exertion Cough frequency: frequently Rescue inhaler frequency:   Usually BID in morning and night Limitation of activity: yes Productive cough:  no Last Spirometry: not recently  Pneumovax: Up to date Influenza: Not up to Date   Relevant past medical, surgical, family and social history reviewed and updated as indicated. Interim medical history since our last visit reviewed. Allergies and medications reviewed and updated.  Review of Systems  Constitutional: Negative.   HENT: Negative.   Eyes: Negative.   Respiratory: Positive for cough, shortness of breath and wheezing. Negative for apnea, choking, chest tightness and stridor.   Cardiovascular: Negative.   Gastrointestinal: Negative.   Endocrine: Negative.   Musculoskeletal: Negative.   Skin: Negative.   Allergic/Immunologic: Negative.   Neurological: Negative.   Psychiatric/Behavioral: Negative.     Per HPI unless specifically indicated above     Objective:    BP 136/88 mmHg  Pulse 82  Temp(Src) 98.2 F (36.8 C)  Ht 5' 5.1" (1.654 m)  Wt 338 lb (153.316 kg)  BMI 56.04 kg/m2  SpO2 94%  Wt Readings from Last 3 Encounters:  09/09/15 338 lb (153.316 kg)  09/02/15 338 lb (153.316 kg)  08/11/15 334 lb 9.6 oz (151.774 kg)    Physical Exam  Constitutional: He is oriented to person, place, and time. He appears well-developed and well-nourished. No distress.  HENT:  Head: Normocephalic and atraumatic.  Right Ear: Hearing  normal.  Left Ear: Hearing normal.  Nose: Nose normal.  Eyes: Conjunctivae and lids are normal. Right eye exhibits no discharge. Left eye exhibits no discharge. No scleral icterus.  Pulmonary/Chest: Effort normal. He has decreased breath sounds in the right upper field, the right lower field, the left upper field and the left lower field.  Crackles in bilateral bases  Musculoskeletal: Normal range of motion.  Neurological: He is alert and oriented to person, place, and time.  Skin: Skin is intact. No rash noted.  Psychiatric: He has a normal mood and affect. His speech is normal and behavior is normal. Judgment and thought content normal. Cognition and memory are normal.    Results for orders placed or performed in visit on 09/02/15  CoaguChek XS/INR Waived  Result Value Ref Range   INR 2.7 (H) 0.9 - 1.1   Prothrombin Time 32.0 sec      Assessment & Plan:   Problem List Items Addressed This Visit      Respiratory   COPD exacerbation ([place]) - Primary    Exacerbation not completely resolved. 6 day prednisone taper and z-pack. Call with any concerns or if not getting better. Due to come back for regular appointment 3/30.      Relevant Medications   predniSONE (DELTASONE) 50 MG tablet   azithromycin (ZITHROMAX) 250 MG tablet       Follow up plan: Return As scheduled.

## 2015-09-20 ENCOUNTER — Encounter: Payer: Self-pay | Admitting: Unknown Physician Specialty

## 2015-09-30 ENCOUNTER — Ambulatory Visit: Payer: Self-pay | Admitting: Family Medicine

## 2015-10-06 ENCOUNTER — Ambulatory Visit (INDEPENDENT_AMBULATORY_CARE_PROVIDER_SITE_OTHER): Payer: Self-pay | Admitting: Family Medicine

## 2015-10-06 ENCOUNTER — Encounter: Payer: Self-pay | Admitting: Family Medicine

## 2015-10-06 ENCOUNTER — Other Ambulatory Visit: Payer: Self-pay | Admitting: Family Medicine

## 2015-10-06 DIAGNOSIS — Z952 Presence of prosthetic heart valve: Secondary | ICD-10-CM

## 2015-10-06 DIAGNOSIS — Z954 Presence of other heart-valve replacement: Secondary | ICD-10-CM | POA: Diagnosis not present

## 2015-10-06 LAB — COAGUCHEK XS/INR WAIVED
INR: 3.9 — ABNORMAL HIGH (ref 0.9–1.1)
PROTHROMBIN TIME: 46.8 s

## 2015-10-06 NOTE — Progress Notes (Signed)
BP 107/65 mmHg  Pulse 65  Temp(Src) 98.2 F (36.8 C)  Ht 5' 5.6" (1.666 m)  Wt 343 lb (155.584 kg)  BMI 56.06 kg/m2  SpO2 97%   Subjective:    Patient ID: Roberto [name], male    DOB: Jun 27, 1956, 60 y.o.   MRN: [id number]  CC: Coumadin management  HPI: This patient is a 60 y.o. male who presents for coumadin management. The expected duration of coumadin treatment is lifelong The reason for anticoagulation is  mechanical heart valve.  Present Coumadin dose: 4mg , 4mg , 8mg  repeat Goal: 2.5-3.5  Excessive bruising: no Nose bleeding: no Rectal bleeding: no Prolonged menstrual cycles: N/A Eating diet with consistent amounts of foods containing Vitamin K:yes Any recent antibiotic use? yes  Relevant past medical, surgical, family and social history reviewed and updated as indicated. Interim medical history since our last visit reviewed. Allergies and medications reviewed and updated.  ROS: Per HPI unless specifically indicated above     Objective:    BP 107/65 mmHg  Pulse 65  Temp(Src) 98.2 F (36.8 C)  Ht 5' 5.6" (1.666 m)  Wt 343 lb (155.584 kg)  BMI 56.06 kg/m2  SpO2 97%  Wt Readings from Last 3 Encounters:  10/06/15 343 lb (155.584 kg)  09/09/15 338 lb (153.316 kg)  09/02/15 338 lb (153.316 kg)     General: Well appearing, well nourished in no distress.  Normal mood and affect. Skin: No excessive bruising or rash  Last INR: 3.9 Last PT: 46.8    Last CBC:  Lab Results  Component Value Date   WBC 8.3 05/25/2014   HGB 13.5 05/25/2014   HCT 41.6 05/25/2014   MCV 88 05/25/2014   PLT 259 05/25/2014    Results for orders placed or performed in visit on 09/02/15  CoaguChek XS/INR Waived  Result Value Ref Range   INR 2.7 (H) 0.9 - 1.1   Prothrombin Time 32.0 sec       Assessment:     ICD-9-CM ICD-10-CM   1. Mechanical heart valve present V43.3 Z95.4 CoaguChek XS/INR Waived     CoaguChek XS/INR Waived    Plan:   Discussed current plan face-to-face  with patient. For coumadin dosing, elected to continue current dose, although slightly high, likely due to recent antibiotic use. Will plan to recheck INR in 2 weeks. Will call with any concerns or any sign of bleeding.

## 2015-10-20 ENCOUNTER — Encounter: Payer: Self-pay | Admitting: Family Medicine

## 2015-10-20 ENCOUNTER — Ambulatory Visit (INDEPENDENT_AMBULATORY_CARE_PROVIDER_SITE_OTHER): Payer: Self-pay | Admitting: Family Medicine

## 2015-10-20 DIAGNOSIS — Z954 Presence of other heart-valve replacement: Secondary | ICD-10-CM

## 2015-10-20 DIAGNOSIS — Z952 Presence of prosthetic heart valve: Secondary | ICD-10-CM

## 2015-10-20 LAB — COAGUCHEK XS/INR WAIVED
INR: 2.6 — AB (ref 0.9–1.1)
PROTHROMBIN TIME: 31.2 s

## 2015-10-20 NOTE — Progress Notes (Signed)
BP 135/82 mmHg  Pulse 69  Temp(Src) 96.5 F (35.8 C)  Ht 5' 5.6" (1.666 m)  Wt 343 lb (155.584 kg)  BMI 56.06 kg/m2  SpO2 98%   Subjective:    Patient ID: Roberto [name], male    DOB: Nov 26, 1955, 60 y.o.   MRN: [id number]  CC: Coumadin management  HPI: This patient is a 60 y.o. male who presents for coumadin management. The expected duration of coumadin treatment is lifelong The reason for anticoagulation is  mechanical heart valve.  Present Coumadin dose: 4mg , 4mg , 8mg  Goal: 2.5-3.5  Excessive bruising: no Nose bleeding: no Rectal bleeding: no Prolonged menstrual cycles: N/A Eating diet with consistent amounts of foods containing Vitamin K:yes Any recent antibiotic use? no  Relevant past medical, surgical, family and social history reviewed and updated as indicated. Interim medical history since our last visit reviewed. Allergies and medications reviewed and updated.  ROS: Per HPI unless specifically indicated above     Objective:    BP 135/82 mmHg  Pulse 69  Temp(Src) 96.5 F (35.8 C)  Ht 5' 5.6" (1.666 m)  Wt 343 lb (155.584 kg)  BMI 56.06 kg/m2  SpO2 98%  Wt Readings from Last 3 Encounters:  10/20/15 343 lb (155.584 kg)  10/06/15 343 lb (155.584 kg)  09/09/15 338 lb (153.316 kg)     General: Well appearing, well nourished in no distress.  Normal mood and affect. Skin: No excessive bruising or rash  Last INR: 2.6 Last PT: 31.2    Last CBC:  Lab Results  Component Value Date   WBC 8.3 05/25/2014   HGB 13.5 05/25/2014   HCT 41.6 05/25/2014   MCV 88 05/25/2014   PLT 259 05/25/2014    Results for orders placed or performed in visit on 10/06/15  CoaguChek XS/INR Waived  Result Value Ref Range   INR 3.9 (H) 0.9 - 1.1   Prothrombin Time 46.8 sec       Assessment:     ICD-9-CM ICD-10-CM   1. Mechanical heart valve present V43.3 Z95.4 CoaguChek XS/INR Waived    Plan:   Discussed current plan face-to-face with patient. For coumadin dosing,  elected to continue current dose. Will plan to recheck INR in 1 month.

## 2015-10-28 ENCOUNTER — Encounter: Payer: Self-pay | Admitting: Family Medicine

## 2015-11-19 ENCOUNTER — Ambulatory Visit: Payer: Self-pay | Admitting: Family Medicine

## 2015-11-23 ENCOUNTER — Encounter: Payer: Self-pay | Admitting: Family Medicine

## 2015-11-23 ENCOUNTER — Ambulatory Visit (INDEPENDENT_AMBULATORY_CARE_PROVIDER_SITE_OTHER): Payer: Medicare Other | Admitting: Family Medicine

## 2015-11-23 ENCOUNTER — Other Ambulatory Visit: Payer: Self-pay | Admitting: Family Medicine

## 2015-11-23 VITALS — BP 119/78 | HR 80 | Temp 98.7°F | Ht 65.0 in | Wt 336.2 lb

## 2015-11-23 DIAGNOSIS — E785 Hyperlipidemia, unspecified: Secondary | ICD-10-CM

## 2015-11-23 DIAGNOSIS — I10 Essential (primary) hypertension: Secondary | ICD-10-CM

## 2015-11-23 DIAGNOSIS — Z131 Encounter for screening for diabetes mellitus: Secondary | ICD-10-CM | POA: Diagnosis not present

## 2015-11-23 DIAGNOSIS — J441 Chronic obstructive pulmonary disease with (acute) exacerbation: Secondary | ICD-10-CM

## 2015-11-23 DIAGNOSIS — Z952 Presence of prosthetic heart valve: Secondary | ICD-10-CM

## 2015-11-23 DIAGNOSIS — R7301 Impaired fasting glucose: Secondary | ICD-10-CM | POA: Diagnosis not present

## 2015-11-23 DIAGNOSIS — Z954 Presence of other heart-valve replacement: Secondary | ICD-10-CM | POA: Diagnosis not present

## 2015-11-23 LAB — LP+ALT+AST PICCOLO, WAIVED
ALT (SGPT) PICCOLO, WAIVED: 24 U/L (ref 10–47)
AST (SGOT) Piccolo, Waived: 22 U/L (ref 11–38)
Chol/HDL Ratio Piccolo,Waive: 3.8 mg/dL
Cholesterol Piccolo, Waived: 159 mg/dL (ref <200)
HDL Chol Piccolo, Waived: 41 mg/dL — ABNORMAL LOW (ref >59)
LDL Chol Calc Piccolo Waived: 49 mg/dL (ref <100)
Triglycerides Piccolo,Waived: 343 mg/dL — ABNORMAL HIGH (ref <150)
VLDL CHOL CALC PICCOLO,WAIVE: 69 mg/dL — AB (ref <30)

## 2015-11-23 LAB — MICROALBUMIN, URINE WAIVED
Creatinine, Urine Waived: 50 mg/dL (ref 10–300)
MICROALB, UR WAIVED: 10 mg/L (ref 0–19)
Microalb/Creat Ratio: <30 mg/g (ref <30)

## 2015-11-23 LAB — COAGUCHEK XS/INR WAIVED
INR: 2.7 — ABNORMAL HIGH (ref 0.9–1.1)
Prothrombin Time: 32.5 s

## 2015-11-23 LAB — BAYER DCA HB A1C WAIVED: HB A1C: 6.1 % (ref <7.0)

## 2015-11-23 MED ORDER — PREDNISONE 50 MG PO TABS: ORAL_TABLET | ORAL | Status: DC

## 2015-11-23 MED ORDER — WARFARIN SODIUM 4 MG PO TABS: 4.0000 mg | ORAL_TABLET | Freq: Every day | ORAL | Status: DC

## 2015-11-23 NOTE — Assessment & Plan Note (Signed)
Under good control. Continue current regimen. Continue to monitor.  

## 2015-11-23 NOTE — Progress Notes (Signed)
BP 119/78 mmHg  Pulse 80  Temp(Src) 98.7 F (37.1 C)  Ht 5\' 5"  (1.651 m)  Wt 336 lb 3.2 oz (152.499 kg)  BMI 55.95 kg/m2  SpO2 96%   Subjective:    Patient ID: Roberto [name], male    DOB: 30-Jan-1956, 60 y.o.   MRN: [id number]  HPI: Roberto [name] is a 60 y.o. male  Chief Complaint  Patient presents with  . Anticoagulation  . Hypertension  . Hyperlipidemia  . URI   HYPERTENSION / HYPERLIPIDEMIA Satisfied with current treatment? yes Duration of hypertension: chronic BP monitoring frequency: not checking BP medication side effects: no Duration of hyperlipidemia: chronic Cholesterol medication side effects: no Cholesterol supplements: none Medication compliance: excellent compliance Aspirin: yes Recent stressors: no Recurrent headaches: no Visual changes: no Palpitations: no Dyspnea: no Chest pain: no Lower extremity edema: no Dizzy/lightheaded: no  Coumadin Management: The expected duration of coumadin treatment is lifelong The reason for anticoagulation is  mechanical heart valve. Present Coumadin dose: 4mg , 4mg , 8 mg Goal: 2.5-3.5  Excessive bruising: no Nose bleeding: no Rectal bleeding: no Prolonged menstrual cycles: N/A Eating diet with consistent amounts of foods containing Vitamin K:no Any recent antibiotic use? no  UPPER RESPIRATORY TRACT INFECTION Duration: 1 day Worst symptom: breathing Fever: no Cough: yes Shortness of breath: yes Wheezing: yes Chest pain: no Chest tightness: no Chest congestion: no Nasal congestion: yes Runny nose: yes Post nasal drip: no Sneezing: no Sore throat: yes Swollen glands: no Sinus pressure: no Headache: no Face pain: no Toothache: yes Ear pain: no  Ear pressure: no  Eyes red/itching:no Eye drainage/crusting: no  Vomiting: no Rash: no Fatigue: yes Sick contacts: no Strep contacts: no  Context: worse Recurrent sinusitis: no Relief with OTC cold/cough medications: no  Treatments attempted: none     Relevant past medical, surgical, family and social history reviewed and updated as indicated. Interim medical history since our last visit reviewed. Allergies and medications reviewed and updated.  Review of Systems  Constitutional: Negative.   HENT: Positive for congestion, postnasal drip, rhinorrhea and sinus pressure. Negative for dental problem, drooling, ear discharge, ear pain, facial swelling, hearing loss, mouth sores, nosebleeds, sneezing, sore throat, tinnitus, trouble swallowing and voice change.   Respiratory: Positive for cough, chest tightness, shortness of breath and wheezing. Negative for apnea, choking and stridor.   Cardiovascular: Negative.   Gastrointestinal: Negative.   Psychiatric/Behavioral: Negative.     Per HPI unless specifically indicated above     Objective:    BP 119/78 mmHg  Pulse 80  Temp(Src) 98.7 F (37.1 C)  Ht 5\' 5"  (1.651 m)  Wt 336 lb 3.2 oz (152.499 kg)  BMI 55.95 kg/m2  SpO2 96%  Wt Readings from Last 3 Encounters:  11/23/15 336 lb 3.2 oz (152.499 kg)  10/20/15 343 lb (155.584 kg)  10/06/15 343 lb (155.584 kg)    Physical Exam  Constitutional: He is oriented to person, place, and time. He appears well-developed and well-nourished. No distress.  HENT:  Head: Normocephalic and atraumatic.  Right Ear: Hearing and external ear normal.  Left Ear: Hearing and external ear normal.  Nose: Nose normal.  Mouth/Throat: Oropharynx is clear and moist. No oropharyngeal exudate.  Eyes: Conjunctivae, EOM and lids are normal. Pupils are equal, round, and reactive to light. Right eye exhibits no discharge. Left eye exhibits no discharge. No scleral icterus.  Neck: Normal range of motion. Neck supple. No JVD present. No tracheal deviation present. No thyromegaly present.  Cardiovascular: Normal rate, regular  rhythm and intact distal pulses.  Exam reveals no gallop and no friction rub.   No murmur heard. + click  Pulmonary/Chest: Effort normal. No  stridor. No respiratory distress. He has decreased breath sounds in the right upper field, the right middle field, the right lower field, the left upper field, the left middle field and the left lower field. He has wheezes in the right upper field, the right middle field, the right lower field, the left upper field, the left middle field and the left lower field. He has no rales. He exhibits no tenderness.  Musculoskeletal: Normal range of motion.  Lymphadenopathy:    He has cervical adenopathy.  Neurological: He is alert and oriented to person, place, and time.  Skin: Skin is warm, dry and intact. No rash noted. He is not diaphoretic. No erythema. No pallor.  Psychiatric: He has a normal mood and affect. His speech is normal and behavior is normal. Judgment and thought content normal. Cognition and memory are normal.  Nursing note and vitals reviewed.   Results for orders placed or performed in visit on 10/20/15  CoaguChek XS/INR Waived  Result Value Ref Range   INR 2.6 (H) 0.9 - 1.1   Prothrombin Time 31.2 sec      Assessment & Plan:   Problem List Items Addressed This Visit      Cardiovascular and Mediastinum   Mechanical heart valve present    Continue current dose of coumadin. Recheck 1 month.       Relevant Medications   warfarin (COUMADIN) 4 MG tablet   Essential hypertension    Under good control. Continue current regimen. Continue to monitor.       Relevant Medications   warfarin (COUMADIN) 4 MG tablet     Respiratory   COPD exacerbation (HCC) - Primary    Mild today. 5 day burst of prednisone. He will let us know if not getting better or getting worse.       Relevant Medications   predniSONE (DELTASONE) 50 MG tablet     Endocrine   IFG (impaired fasting glucose)    A1c came back at 6.1 today. Will wotk on diet and exercise.         Other   HLD (hyperlipidemia)    Under good control. Continue current regimen. Continue to monitor. Call with any concerns.        Relevant Medications   warfarin (COUMADIN) 4 MG tablet    Other Visit Diagnoses    Screening for diabetes mellitus (DM)        A1c came back at 6.1 today. Will wotk on diet and exercise.         Follow up plan: Return in about 4 weeks (around 12/21/2015) for INR.

## 2015-11-23 NOTE — Assessment & Plan Note (Signed)
Continue current dose of coumadin. Recheck 1 month.

## 2015-11-23 NOTE — Assessment & Plan Note (Signed)
Under good control. Continue current regimen. Continue to monitor. Call with any concerns. 

## 2015-11-23 NOTE — Assessment & Plan Note (Signed)
A1c came back at 6.1 today. Will wotk on diet and exercise.

## 2015-11-23 NOTE — Assessment & Plan Note (Signed)
Mild today. 5 day burst of prednisone. He will let us know if not getting better or getting worse.

## 2015-11-24 ENCOUNTER — Encounter: Payer: Self-pay | Admitting: Family Medicine

## 2015-11-24 LAB — CBC WITH DIFFERENTIAL/PLATELET
BASOS ABS: 0 10*3/uL (ref 0.0–0.2)
Basos: 0 %
EOS (ABSOLUTE): 0.2 10*3/uL (ref 0.0–0.4)
Eos: 2 %
HEMOGLOBIN: 15.4 g/dL (ref 12.6–17.7)
Hematocrit: 46 % (ref 37.5–51.0)
IMMATURE GRANS (ABS): 0 10*3/uL (ref 0.0–0.1)
IMMATURE GRANULOCYTES: 0 %
LYMPHS: 21 %
Lymphocytes Absolute: 2.5 10*3/uL (ref 0.7–3.1)
MCH: 28.3 pg (ref 26.6–33.0)
MCHC: 33.5 g/dL (ref 31.5–35.7)
MCV: 85 fL (ref 79–97)
MONOCYTES: 8 %
Monocytes Absolute: 1 10*3/uL — ABNORMAL HIGH (ref 0.1–0.9)
NEUTROS PCT: 69 %
Neutrophils Absolute: 8.5 10*3/uL — ABNORMAL HIGH (ref 1.4–7.0)
Platelets: 235 10*3/uL (ref 150–379)
RBC: 5.44 x10E6/uL (ref 4.14–5.80)
RDW: 14.7 % (ref 12.3–15.4)
WBC: 12.3 10*3/uL — AB (ref 3.4–10.8)

## 2015-11-24 LAB — COMPREHENSIVE METABOLIC PANEL
A/G RATIO: 1.4 (ref 1.2–2.2)
ALK PHOS: 57 IU/L (ref 39–117)
ALT: 13 IU/L (ref 0–44)
AST: 17 IU/L (ref 0–40)
Albumin: 4 g/dL (ref 3.6–4.8)
BUN/Creatinine Ratio: 18 (ref 10–24)
BUN: 15 mg/dL (ref 8–27)
Bilirubin Total: 0.4 mg/dL (ref 0.0–1.2)
CALCIUM: 9.1 mg/dL (ref 8.6–10.2)
CO2: 22 mmol/L (ref 18–29)
Chloride: 100 mmol/L (ref 96–106)
Creatinine, Ser: 0.84 mg/dL (ref 0.76–1.27)
GFR calc Af Amer: 109 mL/min/{1.73_m2} (ref >59)
GFR, EST NON AFRICAN AMERICAN: 95 mL/min/{1.73_m2} (ref >59)
Globulin, Total: 2.8 g/dL (ref 1.5–4.5)
Glucose: 87 mg/dL (ref 65–99)
POTASSIUM: 4.6 mmol/L (ref 3.5–5.2)
Sodium: 140 mmol/L (ref 134–144)
Total Protein: 6.8 g/dL (ref 6.0–8.5)

## 2015-11-25 ENCOUNTER — Ambulatory Visit: Payer: Self-pay | Admitting: Family Medicine

## 2015-12-01 ENCOUNTER — Telehealth: Payer: Self-pay | Admitting: Unknown Physician Specialty

## 2015-12-01 ENCOUNTER — Telehealth: Payer: Self-pay | Admitting: Family Medicine

## 2015-12-01 MED ORDER — AZITHROMYCIN 250 MG PO TABS: ORAL_TABLET | ORAL | Status: DC

## 2015-12-01 NOTE — Telephone Encounter (Signed)
Forward to Dr.[name]'s box.

## 2015-12-01 NOTE — Telephone Encounter (Signed)
Pt's girlfriend called stated pt still has the cold or whatever he has and wants to know if more medication can be sent in for him. Pharm is Paediatric nurse on [facility]. Stated Dr. Wynetta Emery told them to call if he wasn't feeling better. Please follow up with patient ASAP. Thanks.

## 2015-12-01 NOTE — Telephone Encounter (Signed)
I sent in a z-pack

## 2015-12-01 NOTE — Telephone Encounter (Signed)
No answer

## 2015-12-01 NOTE — Telephone Encounter (Signed)
Discuss with oncall nurse Abigail Butts:  While taking Zithromax decrease coumadin to 1/2 for 3 days.  Check PT INR on Monday.

## 2015-12-20 ENCOUNTER — Telehealth: Payer: Self-pay | Admitting: Family Medicine

## 2015-12-20 NOTE — Telephone Encounter (Signed)
Patient friend [name] called stating that the patient is still not better, he still had a bad cough and she wants to know what to do, thanks.

## 2015-12-20 NOTE — Telephone Encounter (Signed)
Helene Kelp: Please get patient on the schedule this week, he needs to be seen

## 2015-12-20 NOTE — Telephone Encounter (Signed)
Called patient and left vm to call us back. Told him I put him down for Thursday the 22 @ 10:30 since it was the only appt. Available.

## 2015-12-21 ENCOUNTER — Ambulatory Visit: Payer: Self-pay | Admitting: Family Medicine

## 2015-12-23 ENCOUNTER — Ambulatory Visit: Payer: Self-pay | Admitting: Family Medicine

## 2016-01-03 ENCOUNTER — Other Ambulatory Visit: Payer: Self-pay | Admitting: Family Medicine

## 2016-01-04 ENCOUNTER — Other Ambulatory Visit: Payer: Self-pay | Admitting: Family Medicine

## 2016-01-05 ENCOUNTER — Ambulatory Visit: Payer: Self-pay | Admitting: Family Medicine

## 2016-01-06 ENCOUNTER — Telehealth: Payer: Self-pay

## 2016-01-06 MED ORDER — FUROSEMIDE 40 MG PO TABS: 40.0000 mg | ORAL_TABLET | Freq: Every day | ORAL | Status: DC

## 2016-01-06 NOTE — Telephone Encounter (Signed)
Rx sent to his pahrmacy

## 2016-01-06 NOTE — Telephone Encounter (Signed)
[facility] [address] requesting  Furosemide 20mg  Tab  2Tab QD PRN for swelling

## 2016-01-31 ENCOUNTER — Other Ambulatory Visit: Payer: Self-pay | Admitting: Family Medicine

## 2016-02-02 ENCOUNTER — Ambulatory Visit (INDEPENDENT_AMBULATORY_CARE_PROVIDER_SITE_OTHER): Payer: Medicare Other | Admitting: Family Medicine

## 2016-02-02 ENCOUNTER — Encounter: Payer: Self-pay | Admitting: Family Medicine

## 2016-02-02 VITALS — BP 128/78 | HR 66 | Temp 97.9°F | Ht 65.1 in | Wt 342.0 lb

## 2016-02-02 DIAGNOSIS — M5432 Sciatica, left side: Secondary | ICD-10-CM | POA: Diagnosis not present

## 2016-02-02 DIAGNOSIS — Z952 Presence of prosthetic heart valve: Secondary | ICD-10-CM

## 2016-02-02 DIAGNOSIS — Z954 Presence of other heart-valve replacement: Secondary | ICD-10-CM | POA: Diagnosis not present

## 2016-02-02 DIAGNOSIS — H6121 Impacted cerumen, right ear: Secondary | ICD-10-CM | POA: Diagnosis not present

## 2016-02-02 LAB — COAGUCHEK XS/INR WAIVED
INR: 2.6 — ABNORMAL HIGH (ref 0.9–1.1)
PROTHROMBIN TIME: 31.2 s

## 2016-02-02 MED ORDER — SIMVASTATIN 40 MG PO TABS: ORAL_TABLET | ORAL | 6 refills | Status: DC

## 2016-02-02 MED ORDER — CYCLOBENZAPRINE HCL 10 MG PO TABS
10.0000 mg | ORAL_TABLET | Freq: Every day | ORAL | 0 refills | Status: DC
Start: 2016-02-02 — End: 2016-03-01

## 2016-02-02 NOTE — Assessment & Plan Note (Signed)
Discussed current plan face-to-face with patient. For coumadin dosing, elected to continue current dose. Will plan to recheck INR in 1 month. 

## 2016-02-02 NOTE — Progress Notes (Signed)
BP 128/78 (BP Location: Left Arm, Patient Position: Sitting, Cuff Size: Large)   Pulse 66   Temp 97.9 F (36.6 C)   Ht 5' 5.1" (1.654 m)   Wt (!) 342 lb (155.1 kg)   BMI 56.74 kg/m    Subjective:    Patient ID: Roberto [name], male    DOB: 02-11-1956, 60 y.o.   MRN: [id number]  HPI: Roberto [name] is a 60 y.o. male  Chief Complaint  Patient presents with  . Anticoagulation  . Cerumen Impaction  . Back Pain    Patient would like to know what other medication that he can take for back pain   EAG CLOGGED Duration: months Involved ear(s):  bilateral Sensation of feeling clogged/plugged: yes Decreased/muffled hearing:yes Ear pain: no Fever: no Otorrhea: no Hearing loss: no Upper respiratory infection symptoms: no Using Q-Tips: no Status: stable History of cerumenosis: yes Treatments attempted: none  Coumadin Managemen: The expected duration of coumadin treatment is lifelong The reason for anticoagulation is  mechanical heart valve.  Present Coumadin dose: 4, 4, 7mg  and repeat Goal: 2.5-3.5  Excessive bruising: no Nose bleeding: no Rectal bleeding: no Prolonged menstrual cycles: N/A Eating diet with consistent amounts of foods containing Vitamin K:yes Any recent antibiotic use? no  BACK PAIN Duration: 2 weeks Mechanism of injury: no trauma Location: Left and low back and buttock Onset: sudden Severity: severe Quality: sharp, shooting, throbbing, nagging Frequency: intermittent Radiation: L leg below the knee Aggravating factors: prolonged sitting Alleviating factors:  positioning his leg certain ways Status: better Treatments attempted: rest and APAP  Relief with NSAIDs?: No NSAIDs Taken Nighttime pain:  yes Paresthesias / decreased sensation:  no Bowel / bladder incontinence:  no Fevers:  no Dysuria / urinary frequency:  no  Relevant past medical, surgical, family and social history reviewed and updated as indicated. Interim medical history since  our last visit reviewed. Allergies and medications reviewed and updated.  Review of Systems  Per HPI unless specifically indicated above     Objective:    BP 128/78 (BP Location: Left Arm, Patient Position: Sitting, Cuff Size: Large)   Pulse 66   Temp 97.9 F (36.6 C)   Ht 5' 5.1" (1.654 m)   Wt (!) 342 lb (155.1 kg)   BMI 56.74 kg/m   Wt Readings from Last 3 Encounters:  02/02/16 (!) 342 lb (155.1 kg)  11/23/15 (!) 336 lb 3.2 oz (152.5 kg)  10/20/15 (!) 343 lb (155.6 kg)    Physical Exam  Constitutional: He is oriented to person, place, and time. He appears well-developed and well-nourished. No distress.  HENT:  Head: Normocephalic and atraumatic.  Right Ear: Hearing normal.  Left Ear: Hearing and external ear normal.  Nose: Nose normal.  Mouth/Throat: Oropharynx is clear and moist. No oropharyngeal exudate.  Cerumen impaction on the R, able to see TM on the L, but moderate cerumen  Eyes: Conjunctivae and lids are normal. Right eye exhibits no discharge. Left eye exhibits no discharge. No scleral icterus.  Pulmonary/Chest: Effort normal. No respiratory distress.  Musculoskeletal: He exhibits no edema, tenderness or deformity.  Neurological: He is alert and oriented to person, place, and time.  Skin: Skin is warm, dry and intact. No rash noted. No erythema. No pallor.  Psychiatric: He has a normal mood and affect. His speech is normal and behavior is normal. Judgment and thought content normal. Cognition and memory are normal.  Nursing note and vitals reviewed.  Back Exam:    Inspection:  Normal spinal curvature.  No deformity, ecchymosis, erythema, or lesions     Palpation:     Midline spinal tenderness: no      Paralumbar tenderness: no      Parathoracic tenderness: no      Buttocks tenderness: yesLeft     Range of Motion:      Flexion: Fingers to Knees     Extension:Decreased     Lateral bending:Decreased    Rotation:Decreased    Neuro Exam:Lower extremity DTRs  normal & symmetric.  Strength and sensation intact.    Special Tests:      Straight leg raise:negative  Last INR: 2.6 Last PT:  31.2    Last CBC:  Lab Results  Component Value Date   WBC 12.3 (H) 11/23/2015   HGB 13.5 05/25/2014   HCT 46.0 11/23/2015   MCV 85 11/23/2015   PLT 235 11/23/2015    Results for orders placed or performed in visit on 02/02/16  CoaguChek XS/INR Waived  Result Value Ref Range   INR 2.6 (H) 0.9 - 1.1   Prothrombin Time 31.2 sec      Assessment & Plan:   Problem List Items Addressed This Visit      Cardiovascular and Mediastinum   Mechanical heart valve present - Primary    Discussed current plan face-to-face with patient. For coumadin dosing, elected to continue current dose. Will plan to recheck INR in 1 month.      Relevant Medications   simvastatin (ZOCOR) 40 MG tablet   Other Relevant Orders   CoaguChek XS/INR Waived (Completed)    Other Visit Diagnoses    Cerumen impaction, right       Will try debrox at home and come in when he wants them flushed.    Sciatica of left side       Will continue tylenol. Start flexeril at night. Stretches given. Call if not getting better or getting worse.    Relevant Medications   cyclobenzaprine (FLEXERIL) 10 MG tablet       Follow up plan: Return in about 4 weeks (around 03/01/2016) for INR.

## 2016-02-02 NOTE — Patient Instructions (Addendum)
Debrox for your ears Sciatica With Rehab The sciatic nerve runs from the back down the leg and is responsible for sensation and control of the muscles in the back (posterior) side of the thigh, lower leg, and foot. Sciatica is a condition that is characterized by inflammation of this nerve.  SYMPTOMS   Signs of nerve damage, including numbness and/or weakness along the posterior side of the lower extremity.  Pain in the back of the thigh that may also travel down the leg.  Pain that worsens when sitting for long periods of time.  Occasionally, pain in the back or buttock. CAUSES  Inflammation of the sciatic nerve is the cause of sciatica. The inflammation is due to something irritating the nerve. Common sources of irritation include:  Sitting for long periods of time.  Direct trauma to the nerve.  Arthritis of the spine.  Herniated or ruptured disk.  Slipping of the vertebrae (spondylolisthesis).  Pressure from soft tissues, such as muscles or ligament-like tissue (fascia). RISK INCREASES WITH:  Sports that place pressure or stress on the spine (football or weightlifting).  Poor strength and flexibility.  Failure to warm up properly before activity.  Family history of low back pain or disk disorders.  Previous back injury or surgery.  Poor body mechanics, especially when lifting, or poor posture. PREVENTION   Warm up and stretch properly before activity.  Maintain physical fitness:  Strength, flexibility, and endurance.  Cardiovascular fitness.  Learn and use proper technique, especially with posture and lifting. When possible, have coach correct improper technique.  Avoid activities that place stress on the spine. PROGNOSIS If treated properly, then sciatica usually resolves within 6 weeks. However, occasionally surgery is necessary.  RELATED COMPLICATIONS   Permanent nerve damage, including pain, numbness, tingle, or weakness.  Chronic back pain.  Risks  of surgery: infection, bleeding, nerve damage, or damage to surrounding tissues. TREATMENT Treatment initially involves resting from any activities that aggravate your symptoms. The use of ice and medication may help reduce pain and inflammation. The use of strengthening and stretching exercises may help reduce pain with activity. These exercises may be performed at home or with referral to a therapist. A therapist may recommend further treatments, such as transcutaneous electronic nerve stimulation (TENS) or ultrasound. Your caregiver may recommend corticosteroid injections to help reduce inflammation of the sciatic nerve. If symptoms persist despite non-surgical (conservative) treatment, then surgery may be recommended. MEDICATION  If pain medication is necessary, then nonsteroidal anti-inflammatory medications, such as aspirin and ibuprofen, or other minor pain relievers, such as acetaminophen, are often recommended.  Do not take pain medication for 7 days before surgery.  Prescription pain relievers may be given if deemed necessary by your caregiver. Use only as directed and only as much as you need.  Ointments applied to the skin may be helpful.  Corticosteroid injections may be given by your caregiver. These injections should be reserved for the most serious cases, because they may only be given a certain number of times. HEAT AND COLD  Cold treatment (icing) relieves pain and reduces inflammation. Cold treatment should be applied for 10 to 15 minutes every 2 to 3 hours for inflammation and pain and immediately after any activity that aggravates your symptoms. Use ice packs or massage the area with a piece of ice (ice massage).  Heat treatment may be used prior to performing the stretching and strengthening activities prescribed by your caregiver, physical therapist, or athletic trainer. Use a heat pack or soak the  injury in warm water. SEEK MEDICAL CARE IF:  Treatment seems to offer no  benefit, or the condition worsens.  Any medications produce adverse side effects. EXERCISES  RANGE OF MOTION (ROM) AND STRETCHING EXERCISES - Sciatica Most people with sciatic will find that their symptoms worsen with either excessive bending forward (flexion) or arching at the low back (extension). The exercises which will help resolve your symptoms will focus on the opposite motion. Your physician, physical therapist or athletic trainer will help you determine which exercises will be most helpful to resolve your low back pain. Do not complete any exercises without first consulting with your clinician. Discontinue any exercises which worsen your symptoms until you speak to your clinician. If you have pain, numbness or tingling which travels down into your buttocks, leg or foot, the goal of the therapy is for these symptoms to move closer to your back and eventually resolve. Occasionally, these leg symptoms will get better, but your low back pain may worsen; this is typically an indication of progress in your rehabilitation. Be certain to be very alert to any changes in your symptoms and the activities in which you participated in the 24 hours prior to the change. Sharing this information with your clinician will allow him/her to most efficiently treat your condition. These exercises may help you when beginning to rehabilitate your injury. Your symptoms may resolve with or without further involvement from your physician, physical therapist or athletic trainer. While completing these exercises, remember:   Restoring tissue flexibility helps normal motion to return to the joints. This allows healthier, less painful movement and activity.  An effective stretch should be held for at least 30 seconds.  A stretch should never be painful. You should only feel a gentle lengthening or release in the stretched tissue. FLEXION RANGE OF MOTION AND STRETCHING EXERCISES: STRETCH - Flexion, Single Knee to Chest    Lie on a firm bed or floor with both legs extended in front of you.  Keeping one leg in contact with the floor, bring your opposite knee to your chest. Hold your leg in place by either grabbing behind your thigh or at your knee.  Pull until you feel a gentle stretch in your low back. Hold __________ seconds.  Slowly release your grasp and repeat the exercise with the opposite side. Repeat __________ times. Complete this exercise __________ times per day.  STRETCH - Flexion, Double Knee to Chest  Lie on a firm bed or floor with both legs extended in front of you.  Keeping one leg in contact with the floor, bring your opposite knee to your chest.  Tense your stomach muscles to support your back and then lift your other knee to your chest. Hold your legs in place by either grabbing behind your thighs or at your knees.  Pull both knees toward your chest until you feel a gentle stretch in your low back. Hold __________ seconds.  Tense your stomach muscles and slowly return one leg at a time to the floor. Repeat __________ times. Complete this exercise __________ times per day.  STRETCH - Low Trunk Rotation   Lie on a firm bed or floor. Keeping your legs in front of you, bend your knees so they are both pointed toward the ceiling and your feet are flat on the floor.  Extend your arms out to the side. This will stabilize your upper body by keeping your shoulders in contact with the floor.  Gently and slowly drop both knees together  to one side until you feel a gentle stretch in your low back. Hold for __________ seconds.  Tense your stomach muscles to support your low back as you bring your knees back to the starting position. Repeat the exercise to the other side. Repeat __________ times. Complete this exercise __________ times per day  EXTENSION RANGE OF MOTION AND FLEXIBILITY EXERCISES: STRETCH - Extension, Prone on Elbows  Lie on your stomach on the floor, a bed will be too soft.  Place your palms about shoulder width apart and at the height of your head.  Place your elbows under your shoulders. If this is too painful, stack pillows under your chest.  Allow your body to relax so that your hips drop lower and make contact more completely with the floor.  Hold this position for __________ seconds.  Slowly return to lying flat on the floor. Repeat __________ times. Complete this exercise __________ times per day.  RANGE OF MOTION - Extension, Prone Press Ups  Lie on your stomach on the floor, a bed will be too soft. Place your palms about shoulder width apart and at the height of your head.  Keeping your back as relaxed as possible, slowly straighten your elbows while keeping your hips on the floor. You may adjust the placement of your hands to maximize your comfort. As you gain motion, your hands will come more underneath your shoulders.  Hold this position __________ seconds.  Slowly return to lying flat on the floor. Repeat __________ times. Complete this exercise __________ times per day.  STRENGTHENING EXERCISES - Sciatica  These exercises may help you when beginning to rehabilitate your injury. These exercises should be done near your "sweet spot." This is the neutral, low-back arch, somewhere between fully rounded and fully arched, that is your least painful position. When performed in this safe range of motion, these exercises can be used for people who have either a flexion or extension based injury. These exercises may resolve your symptoms with or without further involvement from your physician, physical therapist or athletic trainer. While completing these exercises, remember:   Muscles can gain both the endurance and the strength needed for everyday activities through controlled exercises.  Complete these exercises as instructed by your physician, physical therapist or athletic trainer. Progress with the resistance and repetition exercises only as your  caregiver advises.  You may experience muscle soreness or fatigue, but the pain or discomfort you are trying to eliminate should never worsen during these exercises. If this pain does worsen, stop and make certain you are following the directions exactly. If the pain is still present after adjustments, discontinue the exercise until you can discuss the trouble with your clinician. STRENGTHENING - Deep Abdominals, Pelvic Tilt   Lie on a firm bed or floor. Keeping your legs in front of you, bend your knees so they are both pointed toward the ceiling and your feet are flat on the floor.  Tense your lower abdominal muscles to press your low back into the floor. This motion will rotate your pelvis so that your tail bone is scooping upwards rather than pointing at your feet or into the floor.  With a gentle tension and even breathing, hold this position for __________ seconds. Repeat __________ times. Complete this exercise __________ times per day.  STRENGTHENING - Abdominals, Crunches   Lie on a firm bed or floor. Keeping your legs in front of you, bend your knees so they are both pointed toward the ceiling and your feet  are flat on the floor. Cross your arms over your chest.  Slightly tip your chin down without bending your neck.  Tense your abdominals and slowly lift your trunk high enough to just clear your shoulder blades. Lifting higher can put excessive stress on the low back and does not further strengthen your abdominal muscles.  Control your return to the starting position. Repeat __________ times. Complete this exercise __________ times per day.  STRENGTHENING - Quadruped, Opposite UE/LE Lift  Assume a hands and knees position on a firm surface. Keep your hands under your shoulders and your knees under your hips. You may place padding under your knees for comfort.  Find your neutral spine and gently tense your abdominal muscles so that you can maintain this position. Your shoulders and  hips should form a rectangle that is parallel with the floor and is not twisted.  Keeping your trunk steady, lift your right hand no higher than your shoulder and then your left leg no higher than your hip. Make sure you are not holding your breath. Hold this position __________ seconds.  Continuing to keep your abdominal muscles tense and your back steady, slowly return to your starting position. Repeat with the opposite arm and leg. Repeat __________ times. Complete this exercise __________ times per day.  STRENGTHENING - Abdominals and Quadriceps, Straight Leg Raise   Lie on a firm bed or floor with both legs extended in front of you.  Keeping one leg in contact with the floor, bend the other knee so that your foot can rest flat on the floor.  Find your neutral spine, and tense your abdominal muscles to maintain your spinal position throughout the exercise.  Slowly lift your straight leg off the floor about 6 inches for a count of 15, making sure to not hold your breath.  Still keeping your neutral spine, slowly lower your leg all the way to the floor. Repeat this exercise with each leg __________ times. Complete this exercise __________ times per day. POSTURE AND BODY MECHANICS CONSIDERATIONS - Sciatica Keeping correct posture when sitting, standing or completing your activities will reduce the stress put on different body tissues, allowing injured tissues a chance to heal and limiting painful experiences. The following are general guidelines for improved posture. Your physician or physical therapist will provide you with any instructions specific to your needs. While reading these guidelines, remember:  The exercises prescribed by your provider will help you have the flexibility and strength to maintain correct postures.  The correct posture provides the optimal environment for your joints to work. All of your joints have less wear and tear when properly supported by a spine with good  posture. This means you will experience a healthier, less painful body.  Correct posture must be practiced with all of your activities, especially prolonged sitting and standing. Correct posture is as important when doing repetitive low-stress activities (typing) as it is when doing a single heavy-load activity (lifting). RESTING POSITIONS Consider which positions are most painful for you when choosing a resting position. If you have pain with flexion-based activities (sitting, bending, stooping, squatting), choose a position that allows you to rest in a less flexed posture. You would want to avoid curling into a fetal position on your side. If your pain worsens with extension-based activities (prolonged standing, working overhead), avoid resting in an extended position such as sleeping on your stomach. Most people will find more comfort when they rest with their spine in a more neutral position, neither  too rounded nor too arched. Lying on a non-sagging bed on your side with a pillow between your knees, or on your back with a pillow under your knees will often provide some relief. Keep in mind, being in any one position for a prolonged period of time, no matter how correct your posture, can still lead to stiffness. PROPER SITTING POSTURE In order to minimize stress and discomfort on your spine, you must sit with correct posture Sitting with good posture should be effortless for a healthy body. Returning to good posture is a gradual process. Many people can work toward this most comfortably by using various supports until they have the flexibility and strength to maintain this posture on their own. When sitting with proper posture, your ears will fall over your shoulders and your shoulders will fall over your hips. You should use the back of the chair to support your upper back. Your low back will be in a neutral position, just slightly arched. You may place a small pillow or folded towel at the base of your  low back for support.  When working at a desk, create an environment that supports good, upright posture. Without extra support, muscles fatigue and lead to excessive strain on joints and other tissues. Keep these recommendations in mind: CHAIR:   A chair should be able to slide under your desk when your back makes contact with the back of the chair. This allows you to work closely.  The chair's height should allow your eyes to be level with the upper part of your monitor and your hands to be slightly lower than your elbows. BODY POSITION  Your feet should make contact with the floor. If this is not possible, use a foot rest.  Keep your ears over your shoulders. This will reduce stress on your neck and low back. INCORRECT SITTING POSTURES   If you are feeling tired and unable to assume a healthy sitting posture, do not slouch or slump. This puts excessive strain on your back tissues, causing more damage and pain. Healthier options include:  Using more support, like a lumbar pillow.  Switching tasks to something that requires you to be upright or walking.  Talking a brief walk.  Lying down to rest in a neutral-spine position. PROLONGED STANDING WHILE SLIGHTLY LEANING FORWARD  When completing a task that requires you to lean forward while standing in one place for a long time, place either foot up on a stationary 2-4 inch high object to help maintain the best posture. When both feet are on the ground, the low back tends to lose its slight inward curve. If this curve flattens (or becomes too large), then the back and your other joints will experience too much stress, fatigue more quickly and can cause pain.  CORRECT STANDING POSTURES Proper standing posture should be assumed with all daily activities, even if they only take a few moments, like when brushing your teeth. As in sitting, your ears should fall over your shoulders and your shoulders should fall over your hips. You should keep a  slight tension in your abdominal muscles to brace your spine. Your tailbone should point down to the ground, not behind your body, resulting in an over-extended swayback posture.  INCORRECT STANDING POSTURES  Common incorrect standing postures include a forward head, locked knees and/or an excessive swayback. WALKING Walk with an upright posture. Your ears, shoulders and hips should all line-up. PROLONGED ACTIVITY IN A FLEXED POSITION When completing a task that requires  you to bend forward at your waist or lean over a low surface, try to find a way to stabilize 3 of 4 of your limbs. You can place a hand or elbow on your thigh or rest a knee on the surface you are reaching across. This will provide you more stability so that your muscles do not fatigue as quickly. By keeping your knees relaxed, or slightly bent, you will also reduce stress across your low back. CORRECT LIFTING TECHNIQUES DO :   Assume a wide stance. This will provide you more stability and the opportunity to get as close as possible to the object which you are lifting.  Tense your abdominals to brace your spine; then bend at the knees and hips. Keeping your back locked in a neutral-spine position, lift using your leg muscles. Lift with your legs, keeping your back straight.  Test the weight of unknown objects before attempting to lift them.  Try to keep your elbows locked down at your sides in order get the best strength from your shoulders when carrying an object.  Always ask for help when lifting heavy or awkward objects. INCORRECT LIFTING TECHNIQUES DO NOT:   Lock your knees when lifting, even if it is a small object.  Bend and twist. Pivot at your feet or move your feet when needing to change directions.  Assume that you cannot safely pick up a paperclip without proper posture.   This information is not intended to replace advice given to you by your health care provider. Make sure you discuss any questions you have  with your health care provider.   Document Released: 06/19/2005 Document Revised: 11/03/2014 Document Reviewed: 10/01/2008 Elsevier Interactive Patient Education [employer].

## 2016-02-03 ENCOUNTER — Telehealth: Payer: Self-pay | Admitting: Family Medicine

## 2016-02-03 NOTE — Telephone Encounter (Signed)
Left message to let patient know that the medication was sent to [facility]. Patient will need to get [facility] to call Kristopher Oppenheim to get the prescription.

## 2016-02-03 NOTE — Telephone Encounter (Signed)
Pt's girlfriend called stated [facility] on [address] has not received pt's Flexiril RX. Can this be resent. Pharm is [facility] on [facility]. Thanks.

## 2016-02-29 ENCOUNTER — Other Ambulatory Visit: Payer: Self-pay | Admitting: Family Medicine

## 2016-02-29 DIAGNOSIS — Z952 Presence of prosthetic heart valve: Secondary | ICD-10-CM

## 2016-03-01 ENCOUNTER — Ambulatory Visit (INDEPENDENT_AMBULATORY_CARE_PROVIDER_SITE_OTHER): Payer: Medicare Other | Admitting: Family Medicine

## 2016-03-01 ENCOUNTER — Encounter: Payer: Self-pay | Admitting: Family Medicine

## 2016-03-01 VITALS — BP 119/78 | HR 89 | Temp 98.5°F | Ht 65.2 in | Wt 342.8 lb

## 2016-03-01 DIAGNOSIS — Z1211 Encounter for screening for malignant neoplasm of colon: Secondary | ICD-10-CM

## 2016-03-01 DIAGNOSIS — F17200 Nicotine dependence, unspecified, uncomplicated: Secondary | ICD-10-CM

## 2016-03-01 DIAGNOSIS — Z954 Presence of other heart-valve replacement: Secondary | ICD-10-CM | POA: Diagnosis not present

## 2016-03-01 DIAGNOSIS — Z952 Presence of prosthetic heart valve: Secondary | ICD-10-CM

## 2016-03-01 DIAGNOSIS — M5432 Sciatica, left side: Secondary | ICD-10-CM

## 2016-03-01 LAB — COAGUCHEK XS/INR WAIVED
INR: 3.5 — ABNORMAL HIGH (ref 0.9–1.1)
PROTHROMBIN TIME: 42.3 s

## 2016-03-01 MED ORDER — CYCLOBENZAPRINE HCL 10 MG PO TABS: 10.0000 mg | ORAL_TABLET | Freq: Every day | ORAL | 3 refills | Status: DC

## 2016-03-01 NOTE — Progress Notes (Signed)
BP 119/78 (BP Location: Left Arm, Patient Position: Sitting, Cuff Size: Large)   Pulse 89   Temp 98.5 F (36.9 C)   Ht 5' 5.2" (1.656 m)   Wt (!) 342 lb 12.8 oz (155.5 kg)   SpO2 95%   BMI 56.70 kg/m    Subjective:    Patient ID: Roberto [name], male    DOB: October 24, 1955, 60 y.o.   MRN: [id number]  CC: Coumadin management  HPI: This patient is a 59 y.o. male who presents for coumadin management. The expected duration of coumadin treatment is lifelong The reason for anticoagulation is  mechanical heart valve.  Present Coumadin dose: 4, 4, 7mg  repeat Goal: 2.5-3.5  Excessive bruising: no Nose bleeding: no Rectal bleeding: no Prolonged menstrual cycles: N/A Eating diet with consistent amounts of foods containing Vitamin K:yes Any recent antibiotic use? no  Roberto [name] is also very concerned about lung cancer. Has been smoking for a long time up to 2-3 packs a day for several years since he was 60yo, now down to 1/2 pack a day. + COPD with exacerbations. No weight loss. No cough at this time.   Also notes that he has some swelling in his belly with certain foods that he eats. Would like to make sure everything is OK, so would like a colonoscopy. Has never had one.   Back is doing much better on his flexeril. Having trouble doing the stretches. Would like a refill.   Relevant past medical, surgical, family and social history reviewed and updated as indicated. Interim medical history since our last visit reviewed. Allergies and medications reviewed and updated.  ROS: Per HPI unless specifically indicated above     Objective:    BP 119/78 (BP Location: Left Arm, Patient Position: Sitting, Cuff Size: Large)   Pulse 89   Temp 98.5 F (36.9 C)   Ht 5' 5.2" (1.656 m)   Wt (!) 342 lb 12.8 oz (155.5 kg)   SpO2 95%   BMI 56.70 kg/m   Wt Readings from Last 3 Encounters:  03/01/16 (!) 342 lb 12.8 oz (155.5 kg)  02/02/16 (!) 342 lb (155.1 kg)  11/23/15 (!) 336 lb 3.2 oz (152.5 kg)     Physical Exam  Constitutional: He is oriented to person, place, and time. He appears well-developed and well-nourished. No distress.  HENT:  Head: Normocephalic and atraumatic.  Right Ear: Hearing normal.  Left Ear: Hearing normal.  Nose: Nose normal.  Eyes: Conjunctivae and lids are normal. Right eye exhibits no discharge. Left eye exhibits no discharge. No scleral icterus.  Cardiovascular: Normal rate, regular rhythm and intact distal pulses.  Exam reveals no gallop and no friction rub.   No murmur heard. + click  Pulmonary/Chest: Effort normal and breath sounds normal. No respiratory distress. He has no wheezes. He has no rales. He exhibits no tenderness.  Musculoskeletal: Normal range of motion.  Neurological: He is alert and oriented to person, place, and time.  Skin: Skin is warm, dry and intact. No rash noted. No erythema. No pallor.  Psychiatric: He has a normal mood and affect. His speech is normal and behavior is normal. Judgment and thought content normal. Cognition and memory are normal.  Vitals reviewed.  Last INR: 3.5 Last PT: 42.3    Last CBC:  Lab Results  Component Value Date   WBC 12.3 (H) 11/23/2015   HGB 13.5 05/25/2014   HCT 46.0 11/23/2015   MCV 85 11/23/2015   PLT 235 11/23/2015  Results for orders placed or performed in visit on 02/02/16  CoaguChek XS/INR Waived  Result Value Ref Range   INR 2.6 (H) 0.9 - 1.1   Prothrombin Time 31.2 sec       Assessment/Plan:   Problem List Items Addressed This Visit      Cardiovascular and Mediastinum   Mechanical heart valve present - Primary    Discussed current plan face-to-face with patient. For coumadin dosing, elected to continue current dose. Will plan to recheck INR in 1 month.        Other   Tobacco dependence    >50 pack year smoking history, would like to be screened for lung cancer. Message to Burgess Estelle put in today and they will take care of it.        Other Visit Diagnoses     Screening for colon cancer       Would like to see Dr. Allen Norris for colonoscopy. Referral put in today.   Relevant Orders   Ambulatory referral to General Surgery   Sciatica of left side       Doing better with flexeril. Refill given today. Continue to monitor. Call with any concerns.    Relevant Medications   cyclobenzaprine (FLEXERIL) 10 MG tablet

## 2016-03-01 NOTE — Assessment & Plan Note (Signed)
Discussed current plan face-to-face with patient. For coumadin dosing, elected to continue current dose. Will plan to recheck INR in 1 month. 

## 2016-03-01 NOTE — Assessment & Plan Note (Addendum)
>  50 pack year smoking history, would like to be screened for lung cancer. Message to Burgess Estelle put in today and they will take care of it.

## 2016-03-02 DIAGNOSIS — I428 Other cardiomyopathies: Secondary | ICD-10-CM | POA: Insufficient documentation

## 2016-03-02 DIAGNOSIS — Z952 Presence of prosthetic heart valve: Secondary | ICD-10-CM | POA: Insufficient documentation

## 2016-03-07 ENCOUNTER — Telehealth: Payer: Self-pay | Admitting: Family Medicine

## 2016-03-07 MED ORDER — FLUTICASONE-SALMETEROL 250-50 MCG/DOSE IN AEPB: 1.0000 | INHALATION_SPRAY | Freq: Two times a day (BID) | RESPIRATORY_TRACT | 6 refills | Status: DC

## 2016-03-07 MED ORDER — TIOTROPIUM BROMIDE MONOHYDRATE 18 MCG IN CAPS: 18.0000 ug | ORAL_CAPSULE | Freq: Every day | RESPIRATORY_TRACT | 6 refills | Status: DC

## 2016-03-07 NOTE — Telephone Encounter (Signed)
Pt's girlfriend called stated pt needs refill on Advair and Spiriva. Pharm is Paediatric nurse on [address]. Thanks.

## 2016-03-07 NOTE — Telephone Encounter (Signed)
Rx sent to his pharmacy

## 2016-03-08 ENCOUNTER — Telehealth: Payer: Self-pay | Admitting: Gastroenterology

## 2016-03-08 ENCOUNTER — Telehealth: Payer: Self-pay | Admitting: *Deleted

## 2016-03-08 NOTE — Telephone Encounter (Signed)
Received referral for initial lung cancer screening scan. Contacted patient and obtained smoking history,(former, quit 12/2014, 80 pack year) as well as answering questions related to screening process. Patient denies signs of lung cancer such as weight loss or hemoptysis. Patient denies comorbidity that would prevent curative treatment if lung cancer were found. Patient is tentatively scheduled for shared decision making visit and CT scan on 03/14/16 at 1:30pm, pending insurance approval from business office.

## 2016-03-08 NOTE — Telephone Encounter (Signed)
Please contact for colonoscopy screening. Referral is in Epic.

## 2016-03-09 NOTE — Telephone Encounter (Signed)
Patient needs to be scheduled at [facility]. Called and left a voicemail.

## 2016-03-10 NOTE — Telephone Encounter (Signed)
Gastroenterology Pre-Procedure Review  Request Date: 05/02/2016 Requesting Physician: Dr. Wynetta Emery   PATIENT REVIEW QUESTIONS: The patient responded to the following health history questions as indicated:    1. Are you having any GI issues? no 2. Do you have a personal history of Polyps? no 3. Do you have a family history of Colon Cancer or Polyps? no 4. Diabetes Mellitus? no 5. Joint replacements in the past 12 months?no 6. Major health problems in the past 3 months?no 7. Any artificial heart valves, MVP, or defibrillator?yes (aortic mechanic heart valve)    MEDICATIONS & ALLERGIES:    Patient reports the following regarding taking any anticoagulation/antiplatelet therapy:   Plavix, Coumadin, Eliquis, Xarelto, Lovenox, Pradaxa, Brilinta, or Effient? yes (Coumadin) Aspirin? yes (heart health)  Patient confirms/reports the following medications:  Current Outpatient Prescriptions  Medication Sig Dispense Refill  . albuterol (PROVENTIL HFA;VENTOLIN HFA) 108 (90 Base) MCG/ACT inhaler Inhale 2 puffs into the lungs every 6 (six) hours as needed for wheezing or shortness of breath. 1 Inhaler 0  . aspirin 81 MG chewable tablet Chew 81 mg by mouth daily.      . cyclobenzaprine (FLEXERIL) 10 MG tablet Take 1 tablet (10 mg total) by mouth at bedtime. 30 tablet 3  . furosemide (LASIX) 40 MG tablet Take 1 tablet (40 mg total) by mouth daily. 90 tablet 1  . lisinopril (PRINIVIL,ZESTRIL) 2.5 MG tablet TAKE ONE TABLET BY MOUTH ONCE DAILY 90 tablet 0  . metoprolol tartrate (LOPRESSOR) 25 MG tablet Take 25 mg by mouth 2 (two) times daily.    . simvastatin (ZOCOR) 40 MG tablet TAKE 1 TABLET (40 MG TOTAL) BY MOUTH AT BEDTIME. 30 tablet 6  . warfarin (COUMADIN) 4 MG tablet Take 1 tablet (4 mg total) by mouth daily. 90 tablet 6  . warfarin (COUMADIN) 7.5 MG tablet TAKE ONE TABLET BY MOUTH ONCE DAILY 240 tablet 0   No current facility-administered medications for this visit.     Patient confirms/reports  the following allergies:  No Known Allergies  No orders of the defined types were placed in this encounter.   AUTHORIZATION INFORMATION Primary Insurance: 1D#: Group #:  Secondary Insurance: 1D#: Group #:  SCHEDULE INFORMATION: Date: 05/02/2016 Time: Location: [place]

## 2016-03-10 NOTE — Telephone Encounter (Signed)
Screening Colonoscopy Z12.11 [facility] 05/02/2016 Medicare/Medicaid (no authorization required)

## 2016-03-13 ENCOUNTER — Other Ambulatory Visit: Payer: Self-pay | Admitting: *Deleted

## 2016-03-13 DIAGNOSIS — Z87891 Personal history of nicotine dependence: Secondary | ICD-10-CM

## 2016-03-14 ENCOUNTER — Encounter: Payer: Self-pay | Admitting: Oncology

## 2016-03-14 ENCOUNTER — Ambulatory Visit
Admission: RE | Admit: 2016-03-14 | Discharge: 2016-03-14 | Disposition: A | Discharge status: Home / Self Care | Payer: Medicare Other | Source: Ambulatory Visit | Attending: Oncology | Admitting: Oncology

## 2016-03-14 ENCOUNTER — Encounter (INDEPENDENT_AMBULATORY_CARE_PROVIDER_SITE_OTHER): Payer: Self-pay

## 2016-03-14 ENCOUNTER — Inpatient Hospital Stay: Payer: Medicare Other | Attending: Oncology | Admitting: Oncology

## 2016-03-14 DIAGNOSIS — Z122 Encounter for screening for malignant neoplasm of respiratory organs: Secondary | ICD-10-CM | POA: Diagnosis not present

## 2016-03-14 DIAGNOSIS — Z87891 Personal history of nicotine dependence: Secondary | ICD-10-CM

## 2016-03-14 DIAGNOSIS — I712 Thoracic aortic aneurysm, without rupture: Secondary | ICD-10-CM | POA: Insufficient documentation

## 2016-03-14 DIAGNOSIS — I700 Atherosclerosis of aorta: Secondary | ICD-10-CM | POA: Insufficient documentation

## 2016-03-14 DIAGNOSIS — Z720 Tobacco use: Secondary | ICD-10-CM | POA: Insufficient documentation

## 2016-03-14 DIAGNOSIS — F17210 Nicotine dependence, cigarettes, uncomplicated: Secondary | ICD-10-CM | POA: Insufficient documentation

## 2016-03-14 NOTE — Progress Notes (Signed)
In accordance with CMS guidelines, patient has met eligibility criteria including age, absence of signs or symptoms of lung cancer.  Social History  Substance Use Topics  . Smoking status: Former Smoker    Packs/day: 2.00    Years: 40.00    Types: Cigarettes    Quit date: 12/02/2015  . Smokeless tobacco: Never Used  . Alcohol use No     A shared decision-making session was conducted prior to the performance of CT scan. This includes one or more decision aids, includes benefits and harms of screening, follow-up diagnostic testing, over-diagnosis, false positive rate, and total radiation exposure.  Counseling on the importance of adherence to annual lung cancer LDCT screening, impact of co-morbidities, and ability or willingness to undergo diagnosis and treatment is imperative for compliance of the program.  Counseling on the importance of continued smoking cessation for former smokers; the importance of smoking cessation for current smokers, and information about tobacco cessation interventions have been given to patient including [place] and [address] quit [place] programs.  Written order for lung cancer screening with LDCT has been given to the patient and any and all questions have been answered to the best of my abilities.   Yearly follow up will be coordinated by Burgess Estelle, Thoracic Navigator.

## 2016-03-15 NOTE — Telephone Encounter (Signed)
I spoke with [name], his caregiver, and let her know that Dr. Wynetta Emery has approved for him to stop his warfarin 5 days prior to his procedure, and to start it again 5 days after his procedure has been completed. [name] understood.

## 2016-03-16 ENCOUNTER — Telehealth: Payer: Self-pay | Admitting: Family Medicine

## 2016-03-16 ENCOUNTER — Telehealth: Payer: Self-pay | Admitting: *Deleted

## 2016-03-16 DIAGNOSIS — Z87891 Personal history of nicotine dependence: Secondary | ICD-10-CM

## 2016-03-16 DIAGNOSIS — Z952 Presence of prosthetic heart valve: Secondary | ICD-10-CM

## 2016-03-16 DIAGNOSIS — I7121 Aneurysm of the ascending aorta, without rupture: Secondary | ICD-10-CM

## 2016-03-16 DIAGNOSIS — I429 Cardiomyopathy, unspecified: Secondary | ICD-10-CM

## 2016-03-16 DIAGNOSIS — I700 Atherosclerosis of aorta: Secondary | ICD-10-CM | POA: Insufficient documentation

## 2016-03-16 DIAGNOSIS — I712 Thoracic aortic aneurysm, without rupture, unspecified: Secondary | ICD-10-CM | POA: Insufficient documentation

## 2016-03-16 DIAGNOSIS — F17200 Nicotine dependence, unspecified, uncomplicated: Secondary | ICD-10-CM

## 2016-03-16 NOTE — Telephone Encounter (Signed)
Notified patient's caregiver of LDCT lung cancer screening results with recommendation for 12 month follow up imaging. Also notified of incidental finding noted below. Discussed importance of evaluation of aneurysm. Will forward to PCP.  IMPRESSION: 1. Lung-RADS Category 2, benign appearance or behavior. Continue annual screening with low-dose chest CT without contrast in 12 months 2. Aortic atherosclerosis with aneurysmal dilatation of the ascending thoracic aorta. Ascending thoracic aortic aneurysm. Recommend semi-annual imaging followup by CTA or MRA and referral to cardiothoracic surgery if not already obtained. This recommendation follows 2010 ACCF/AHA/AATS/ACR/ASA/SCA/SCAI/SIR/STS/SVM Guidelines for the Diagnosis and Management of Patients With Thoracic Aortic Disease. Circulation. 2010; 121: [id number]

## 2016-03-16 NOTE — Telephone Encounter (Addendum)
Called to discuss results with [name]. Needs AAA screening due to thoracic aortic anyerysm on CT scan. Will order when we hear back from him. Also needs referral to vascular. LMOM for him to call back

## 2016-03-16 NOTE — Telephone Encounter (Signed)
Discussed with [name].

## 2016-03-17 ENCOUNTER — Telehealth: Payer: Self-pay | Admitting: Family Medicine

## 2016-03-17 NOTE — Telephone Encounter (Signed)
He does not. Thanks

## 2016-03-17 NOTE — Telephone Encounter (Signed)
Roberto [name] called and would like to know if the pt needed to limit his activities until he has his appt with Vascular Surgery.

## 2016-03-17 NOTE — Telephone Encounter (Signed)
Patient notified

## 2016-03-20 ENCOUNTER — Other Ambulatory Visit: Payer: Self-pay | Admitting: Family Medicine

## 2016-03-20 MED ORDER — CYCLOBENZAPRINE HCL 10 MG PO TABS: 10.0000 mg | ORAL_TABLET | Freq: Three times a day (TID) | ORAL | 0 refills | Status: DC | PRN

## 2016-03-20 NOTE — Telephone Encounter (Signed)
Pt's girlfriend called stated pt is out of flexeril. Pt has used current RX due to upping the dosage. Please correct and instructions and resubmit to [facility] on [address]. Please call pt's girlfriend with any questions. Thanks.    Keri,  Pt stated they have not heard anything in regards to referrals please follow up. Thanks.

## 2016-03-20 NOTE — Telephone Encounter (Signed)
Patient's referral was just sent over to [facility] Friday 03/17/16. It was not an urgent priority. 7-10 day turn around time. However, [facility] is usually pretty quick about referrals. They should call him before Friday.

## 2016-03-20 NOTE — Telephone Encounter (Signed)
Refill sent to his pharmacy.

## 2016-03-20 NOTE — Telephone Encounter (Signed)
Patient notified

## 2016-03-24 ENCOUNTER — Telehealth: Payer: Self-pay | Admitting: Family Medicine

## 2016-03-24 ENCOUNTER — Other Ambulatory Visit: Payer: Self-pay | Admitting: Family Medicine

## 2016-03-24 DIAGNOSIS — I712 Thoracic aortic aneurysm, without rupture: Secondary | ICD-10-CM

## 2016-03-24 DIAGNOSIS — I7121 Aneurysm of the ascending aorta, without rupture: Secondary | ICD-10-CM

## 2016-03-24 NOTE — Telephone Encounter (Signed)
Roberto [name](Roberto [name]) has been notified.

## 2016-03-24 NOTE — Telephone Encounter (Signed)
Please let Roberto [name] know that his insurance won't pay for the [place]- so we are going to wait for him to see the vascular doctor and he will decide what he needs

## 2016-03-28 ENCOUNTER — Encounter (INDEPENDENT_AMBULATORY_CARE_PROVIDER_SITE_OTHER): Payer: Self-pay

## 2016-03-28 DIAGNOSIS — I712 Thoracic aortic aneurysm, without rupture: Secondary | ICD-10-CM | POA: Diagnosis not present

## 2016-03-28 DIAGNOSIS — E785 Hyperlipidemia, unspecified: Secondary | ICD-10-CM | POA: Diagnosis not present

## 2016-03-28 DIAGNOSIS — I509 Heart failure, unspecified: Secondary | ICD-10-CM | POA: Diagnosis not present

## 2016-03-28 DIAGNOSIS — G459 Transient cerebral ischemic attack, unspecified: Secondary | ICD-10-CM | POA: Diagnosis not present

## 2016-04-08 ENCOUNTER — Other Ambulatory Visit: Payer: Self-pay | Admitting: Family Medicine

## 2016-04-12 ENCOUNTER — Other Ambulatory Visit: Payer: Self-pay | Admitting: Family Medicine

## 2016-04-12 MED ORDER — METOPROLOL TARTRATE 25 MG PO TABS: 25.0000 mg | ORAL_TABLET | Freq: Two times a day (BID) | ORAL | 1 refills | Status: DC

## 2016-04-12 NOTE — Telephone Encounter (Signed)
Routing to [employer]

## 2016-04-12 NOTE — Telephone Encounter (Signed)
Pt's girlfriend called stated pt is completely out of Metoprolol. Please send ASAP. Pharm is Paediatric nurse on [address]. Thanks.

## 2016-04-19 ENCOUNTER — Other Ambulatory Visit: Payer: Self-pay | Admitting: Family Medicine

## 2016-04-19 ENCOUNTER — Ambulatory Visit (INDEPENDENT_AMBULATORY_CARE_PROVIDER_SITE_OTHER): Payer: Medicare Other | Admitting: Family Medicine

## 2016-04-19 ENCOUNTER — Encounter: Payer: Self-pay | Admitting: Family Medicine

## 2016-04-19 VITALS — BP 129/80 | HR 80 | Temp 98.5°F | Ht 66.1 in | Wt 351.0 lb

## 2016-04-19 DIAGNOSIS — E782 Mixed hyperlipidemia: Secondary | ICD-10-CM | POA: Diagnosis not present

## 2016-04-19 DIAGNOSIS — M545 Low back pain, unspecified: Secondary | ICD-10-CM

## 2016-04-19 DIAGNOSIS — R7301 Impaired fasting glucose: Secondary | ICD-10-CM | POA: Diagnosis not present

## 2016-04-19 DIAGNOSIS — I421 Obstructive hypertrophic cardiomyopathy: Secondary | ICD-10-CM

## 2016-04-19 DIAGNOSIS — I10 Essential (primary) hypertension: Secondary | ICD-10-CM

## 2016-04-19 DIAGNOSIS — G8929 Other chronic pain: Secondary | ICD-10-CM | POA: Insufficient documentation

## 2016-04-19 DIAGNOSIS — Z87891 Personal history of nicotine dependence: Secondary | ICD-10-CM

## 2016-04-19 DIAGNOSIS — E119 Type 2 diabetes mellitus without complications: Secondary | ICD-10-CM

## 2016-04-19 DIAGNOSIS — I712 Thoracic aortic aneurysm, without rupture: Secondary | ICD-10-CM | POA: Diagnosis not present

## 2016-04-19 DIAGNOSIS — Z23 Encounter for immunization: Secondary | ICD-10-CM | POA: Diagnosis not present

## 2016-04-19 DIAGNOSIS — J449 Chronic obstructive pulmonary disease, unspecified: Secondary | ICD-10-CM | POA: Diagnosis not present

## 2016-04-19 DIAGNOSIS — Z952 Presence of prosthetic heart valve: Secondary | ICD-10-CM

## 2016-04-19 DIAGNOSIS — Z0000 Encounter for general adult medical examination without abnormal findings: Secondary | ICD-10-CM

## 2016-04-19 DIAGNOSIS — I700 Atherosclerosis of aorta: Secondary | ICD-10-CM

## 2016-04-19 DIAGNOSIS — R39198 Other difficulties with micturition: Secondary | ICD-10-CM

## 2016-04-19 DIAGNOSIS — F17200 Nicotine dependence, unspecified, uncomplicated: Secondary | ICD-10-CM

## 2016-04-19 DIAGNOSIS — I7121 Aneurysm of the ascending aorta, without rupture: Secondary | ICD-10-CM

## 2016-04-19 DIAGNOSIS — N400 Enlarged prostate without lower urinary tract symptoms: Secondary | ICD-10-CM

## 2016-04-19 DIAGNOSIS — E1159 Type 2 diabetes mellitus with other circulatory complications: Secondary | ICD-10-CM | POA: Insufficient documentation

## 2016-04-19 LAB — UA/M W/RFLX CULTURE, ROUTINE
Bilirubin, UA: NEGATIVE
Glucose, UA: NEGATIVE
Ketones, UA: NEGATIVE
Leukocytes, UA: NEGATIVE
NITRITE UA: NEGATIVE
Protein, UA: NEGATIVE
RBC, UA: NEGATIVE
Specific Gravity, UA: 1.005 (ref 1.005–1.030)
UUROB: 0.2 mg/dL (ref 0.2–1.0)
pH, UA: 5 (ref 5.0–7.5)

## 2016-04-19 LAB — LIPID PANEL PICCOLO, WAIVED
CHOLESTEROL PICCOLO, WAIVED: 193 mg/dL (ref <200)
Chol/HDL Ratio Piccolo,Waive: 4.1 mg/dL
HDL CHOL PICCOLO, WAIVED: 47 mg/dL — AB (ref >59)
LDL Chol Calc Piccolo Waived: 72 mg/dL (ref <100)
TRIGLYCERIDES PICCOLO,WAIVED: 371 mg/dL — AB (ref <150)
VLDL Chol Calc Piccolo,Waive: 74 mg/dL — ABNORMAL HIGH (ref <30)

## 2016-04-19 LAB — COAGUCHEK XS/INR WAIVED
INR: 2.7 — AB (ref 0.9–1.1)
PROTHROMBIN TIME: 32.9 s

## 2016-04-19 LAB — HEMOGLOBIN A1C: Hemoglobin A1C: 6.6

## 2016-04-19 LAB — BAYER DCA HB A1C WAIVED: HB A1C (BAYER DCA - WAIVED): 6.6 % (ref <7.0)

## 2016-04-19 LAB — MICROALBUMIN, URINE WAIVED
Creatinine, Urine Waived: 50 mg/dL (ref 10–300)
MICROALB, UR WAIVED: 10 mg/L (ref 0–19)
Microalb/Creat Ratio: <30 mg/g (ref <30)

## 2016-04-19 MED ORDER — SIMVASTATIN 40 MG PO TABS: ORAL_TABLET | ORAL | 1 refills | Status: DC

## 2016-04-19 MED ORDER — METOPROLOL TARTRATE 25 MG PO TABS: 25.0000 mg | ORAL_TABLET | Freq: Two times a day (BID) | ORAL | 1 refills | Status: DC

## 2016-04-19 MED ORDER — WARFARIN SODIUM 4 MG PO TABS: 4.0000 mg | ORAL_TABLET | Freq: Every day | ORAL | 6 refills | Status: DC

## 2016-04-19 MED ORDER — FUROSEMIDE 40 MG PO TABS: 40.0000 mg | ORAL_TABLET | Freq: Every day | ORAL | 1 refills | Status: DC

## 2016-04-19 MED ORDER — WARFARIN SODIUM 7.5 MG PO TABS: 7.5000 mg | ORAL_TABLET | Freq: Every day | ORAL | 1 refills | Status: DC

## 2016-04-19 MED ORDER — LISINOPRIL 2.5 MG PO TABS: 2.5000 mg | ORAL_TABLET | Freq: Every day | ORAL | 1 refills | Status: DC

## 2016-04-19 NOTE — Assessment & Plan Note (Signed)
Newly diagnosed today with A1c of 6.6. Will get into the [facility] for diabetic education. Will get into eye exam. Will work on diet and exercise. BP and cholesterol under good control. Continue to monitor. Recheck 3 months.

## 2016-04-19 NOTE — Assessment & Plan Note (Signed)
Under good control. Continue current regimen. Continue to monitor. Call with any concerns. 

## 2016-04-19 NOTE — Assessment & Plan Note (Signed)
On coumadin. Continue to monitor.

## 2016-04-19 NOTE — Assessment & Plan Note (Signed)
Under good control. Continue current regimen. Continue to monitor. Recheck 6 months.  

## 2016-04-19 NOTE — Assessment & Plan Note (Addendum)
Levels came back today at diabetes.

## 2016-04-19 NOTE — Assessment & Plan Note (Signed)
Continue to follow with cardiology. Call with any concerns. Keep BP and lipids and sugars within normal range.

## 2016-04-19 NOTE — Assessment & Plan Note (Signed)
Discussed current plan face-to-face with patient. For coumadin dosing, elected to continue current dose. Will plan to recheck INR in 1 month. 

## 2016-04-19 NOTE — Assessment & Plan Note (Signed)
Stable today. Continue current regimen. Continue to monitor.

## 2016-04-19 NOTE — Progress Notes (Signed)
BP 129/80 (BP Location: Left Arm, Patient Position: Sitting, Cuff Size: Large)   Pulse 80   Temp 98.5 F (36.9 C)   Ht 5' 6.1" (1.679 m)   Wt (!) 351 lb (159.2 kg)   SpO2 99%   BMI 56.48 kg/m    Subjective:    Patient ID: Roberto [name], male    DOB: March 20, 1956, 60 y.o.   MRN: [id number]  HPI: Roberto [name] is a 60 y.o. male presenting on 04/19/2016 for comprehensive medical examination. Current medical complaints include:  HYPERTENSION / HYPERLIPIDEMIA Satisfied with current treatment? yes Duration of hypertension: chronic BP monitoring frequency: not checking BP medication side effects: no Past BP meds: lisinopril, metoprolol, lasix Duration of hyperlipidemia: chronic Cholesterol medication side effects: no Cholesterol supplements: none Past cholesterol medications: simvastatin (zocor) Medication compliance: excellent compliance Aspirin: yes Recent stressors: no Recurrent headaches: no Visual changes: no Palpitations: no Dyspnea: no Chest pain: no Lower extremity edema: no Dizzy/lightheaded: no  Impaired Fasting Glucose HbA1C:  Lab Results  Component Value Date   HGBA1C 6.6 04/19/2016   Duration of elevated blood sugar: chronic Polydipsia: no Polyuria: no Weight change: no Visual disturbance: no Glucose Monitoring: no Diabetic Education: Not Completed Family history of diabetes: yes  Coumadin Management.  The expected duration of coumadin treatment is lifelong The reason for anticoagulation is  mechanical heart valve.  Present Coumadin dose: 1/2, 1/2 whole- 7.5 mg Goal: 2.5-3.5  Excessive bruising: no Nose bleeding: no Rectal bleeding: no Prolonged menstrual cycles: N/A Eating diet with consistent amounts of foods containing Vitamin K:yes Any recent antibiotic use? no  BACK PAIN Duration: 2 months.  Mechanism of injury: unknown Location: bilateral and low back Onset: gradual Severity: severe Quality: nagging Frequency:  constant Radiation: none Aggravating factors: standing, laying down Alleviating factors: leaning forward Status: worse Treatments attempted: rest, ice, heat, APAP, ibuprofen and aleve  Relief with NSAIDs?: no Nighttime pain:  no Paresthesias / decreased sensation:  no Bowel / bladder incontinence:  no Fevers:  no Dysuria / urinary frequency:  no  LEG CRAMPS- when he lays down at night it happens for about 5 minutes, and again when she wakes up in the AM- not active during the day Duration: 2 months Pain: yes Severity: severe  Quality:  cramping Location:  lower legs Bilateral:  no Onset: sudden Frequency: intermittent Time of  day:   Morning and at night Sudden unintentional leg jerking:   no Paresthesias:   yes Decreased sensation:  no Weakness:   no Insomnia:   no Fatigue:   yes Alleviating factors: certain positions and medicine Aggravating factors: stepping on it and certain positions Status: stable Treatments attempted:   He currently lives with: Friend Interim Problems from his last visit: no  Depression Screen done today and results listed below:  Depression screen [facility] 2/9 05/11/2015  Decreased Interest 2  Down, Depressed, Hopeless 1  PHQ - 2 Score 3  Altered sleeping 0  Tired, decreased energy 2  Change in appetite 1  Feeling bad or failure about yourself  1  Trouble concentrating 2  Moving slowly or fidgety/restless 0  Suicidal thoughts 0  PHQ-9 Score 9    Past Medical History:  Past Medical History:  Diagnosis Date  . Cellulitis   . Hypercholesterolemia   . TIA (transient ischemic attack) 05/2014    Surgical History:  Past Surgical History:  Procedure Laterality Date  . AORTIC VALVE REPLACEMENT    . CARDIAC SURGERY  2009   CHF  .  CARPAL TUNNEL RELEASE Left 2005  . TONSILLECTOMY  1962    Medications:  Current Outpatient Prescriptions on File Prior to Visit  Medication Sig  . albuterol (PROVENTIL HFA;VENTOLIN HFA) 108 (90 Base) MCG/ACT  inhaler Inhale 2 puffs into the lungs every 6 (six) hours as needed for wheezing or shortness of breath.  Marland Kitchen aspirin 81 MG chewable tablet Chew 81 mg by mouth daily.    . cyclobenzaprine (FLEXERIL) 10 MG tablet Take 1 tablet (10 mg total) by mouth 3 (three) times daily as needed for muscle spasms.   No current facility-administered medications on file prior to visit.     Allergies:  No Known Allergies  Social History:  Social History   Social History  . Marital status: Single    Spouse name: N/A  . Number of children: 2  . Years of education: N/A   Occupational History  . Disabled Unemployed   Social History Main Topics  . Smoking status: Former Smoker    Packs/day: 2.00    Years: 40.00    Types: Cigarettes    Quit date: 12/02/2015  . Smokeless tobacco: Never Used  . Alcohol use No  . Drug use: No  . Sexual activity: No   Other Topics Concern  . Not on file   Social History Narrative  . No narrative on file   History  Smoking Status  . Former Smoker  . Packs/day: 2.00  . Years: 40.00  . Types: Cigarettes  . Quit date: 12/02/2015  Smokeless Tobacco  . Never Used   History  Alcohol Use No    Family History:  Family History  Problem Relation Age of Onset  . Arthritis Mother   . Cancer Mother   . Arthritis Father   . Diabetes Father   . Stroke Father   . Cancer Father   . Cancer Brother   . Cancer Sister     Past medical history, surgical history, medications, allergies, family history and social history reviewed with patient today and changes made to appropriate areas of the chart.   Review of Systems  Constitutional: Negative.   HENT: Negative.   Eyes: Negative.   Respiratory: Positive for cough. Negative for hemoptysis, sputum production, shortness of breath and wheezing.   Cardiovascular: Negative.   Gastrointestinal: Negative.   Genitourinary: Negative.   Musculoskeletal: Positive for back pain. Negative for falls, joint pain, myalgias and neck  pain.  Skin: Negative.   Neurological: Negative.   Endo/Heme/Allergies: Negative.   Psychiatric/Behavioral: Positive for depression. Negative for hallucinations, memory loss, substance abuse and suicidal ideas. The patient is not nervous/anxious and does not have insomnia.     All other ROS negative except what is listed above and in the HPI.      Objective:    BP 129/80 (BP Location: Left Arm, Patient Position: Sitting, Cuff Size: Large)   Pulse 80   Temp 98.5 F (36.9 C)   Ht 5' 6.1" (1.679 m)   Wt (!) 351 lb (159.2 kg)   SpO2 99%   BMI 56.48 kg/m   Wt Readings from Last 3 Encounters:  04/19/16 (!) 351 lb (159.2 kg)  03/14/16 (!) 340 lb (154.2 kg)  03/01/16 (!) 342 lb 12.8 oz (155.5 kg)    Physical Exam  Constitutional: He is oriented to person, place, and time. He appears well-developed and well-nourished. No distress.  Morbidly obese   HENT:  Head: Normocephalic and atraumatic.  Right Ear: Hearing, tympanic membrane, external ear and ear canal  normal.  Left Ear: Hearing, tympanic membrane, external ear and ear canal normal.  Nose: Nose normal.  Mouth/Throat: Uvula is midline, oropharynx is clear and moist and mucous membranes are normal. No oropharyngeal exudate.  Eyes: Conjunctivae, EOM and lids are normal. Pupils are equal, round, and reactive to light. Right eye exhibits no discharge. Left eye exhibits no discharge. No scleral icterus.  Neck: Normal range of motion. Neck supple. No JVD present. No tracheal deviation present. No thyromegaly present.  Cardiovascular: Normal rate, regular rhythm and intact distal pulses.  Exam reveals no gallop and no friction rub.   Murmur heard. Pulmonary/Chest: Effort normal and breath sounds normal. No stridor. No respiratory distress. He has no wheezes. He has no rales. He exhibits no tenderness.  Abdominal: Soft. Bowel sounds are normal. He exhibits no distension and no mass. There is no tenderness. There is no rebound and no  guarding.  Genitourinary:  Genitourinary Comments: Deferred at patient request- will check PSA  Musculoskeletal: Normal range of motion. He exhibits no edema, tenderness or deformity.  Lymphadenopathy:    He has no cervical adenopathy.  Neurological: He is alert and oriented to person, place, and time. He has normal reflexes. He displays normal reflexes. No cranial nerve deficit. He exhibits normal muscle tone. Coordination normal.  Skin: Skin is warm, dry and intact. No rash noted. He is not diaphoretic. No erythema. No pallor.  Psychiatric: He has a normal mood and affect. His speech is normal and behavior is normal. Judgment and thought content normal. Cognition and memory are normal.  Nursing note and vitals reviewed. Back Exam:    Inspection:  Normal spinal curvature.  No deformity, ecchymosis, erythema, or lesions     Palpation:     Midline spinal tenderness: no      Paralumbar tenderness: yes bilateral     Parathoracic tenderness: no      Buttocks tenderness: no     Range of Motion:      Flexion: Fingers to Knees     Extension:Decreased     Lateral bending:Decreased    Rotation:Decreased    Neuro Exam:Lower extremity DTRs normal & symmetric.  Strength and sensation intact.    Special Tests:      Straight leg raise:negative Diabetic Foot Exam - Simple   Simple Foot Form Diabetic Foot exam was performed with the following findings:  Yes 04/19/2016 11:26 AM  Visual Inspection No deformities, no ulcerations, no other skin breakdown bilaterally:  Yes Sensation Testing Intact to touch and monofilament testing bilaterally:  Yes Pulse Check Posterior Tibialis and Dorsalis pulse intact bilaterally:  Yes Comments      Results for orders placed or performed in visit on 04/19/16  Hemoglobin A1c  Result Value Ref Range   Hemoglobin A1C 6.6       Assessment & Plan:   Problem List Items Addressed This Visit      Cardiovascular and Mediastinum   Mechanical heart valve present     Discussed current plan face-to-face with patient. For coumadin dosing, elected to continue current dose. Will plan to recheck INR in 1 month.      Relevant Medications   furosemide (LASIX) 40 MG tablet   lisinopril (PRINIVIL,ZESTRIL) 2.5 MG tablet   metoprolol tartrate (LOPRESSOR) 25 MG tablet   simvastatin (ZOCOR) 40 MG tablet   warfarin (COUMADIN) 4 MG tablet   warfarin (COUMADIN) 7.5 MG tablet   Benign essential hypertension    Under good control. Continue current regimen. Continue to monitor. Call  with any concerns.       Relevant Medications   furosemide (LASIX) 40 MG tablet   lisinopril (PRINIVIL,ZESTRIL) 2.5 MG tablet   metoprolol tartrate (LOPRESSOR) 25 MG tablet   simvastatin (ZOCOR) 40 MG tablet   warfarin (COUMADIN) 4 MG tablet   warfarin (COUMADIN) 7.5 MG tablet   Cardiomyopathy ([place])    Continue to follow with cardiology. Call with any concerns. Keep BP and lipids and sugars within normal range.      Relevant Medications   furosemide (LASIX) 40 MG tablet   lisinopril (PRINIVIL,ZESTRIL) 2.5 MG tablet   metoprolol tartrate (LOPRESSOR) 25 MG tablet   simvastatin (ZOCOR) 40 MG tablet   warfarin (COUMADIN) 4 MG tablet   warfarin (COUMADIN) 7.5 MG tablet   Thoracic ascending aortic aneurysm (HCC)    Saw vascular. Will continue to follow with them.       Relevant Medications   furosemide (LASIX) 40 MG tablet   lisinopril (PRINIVIL,ZESTRIL) 2.5 MG tablet   metoprolol tartrate (LOPRESSOR) 25 MG tablet   simvastatin (ZOCOR) 40 MG tablet   warfarin (COUMADIN) 4 MG tablet   warfarin (COUMADIN) 7.5 MG tablet   Aortic atherosclerosis (HCC)    Will keep BP and sugars and lipids within recommended range. Continue to monitor.       Relevant Medications   furosemide (LASIX) 40 MG tablet   lisinopril (PRINIVIL,ZESTRIL) 2.5 MG tablet   metoprolol tartrate (LOPRESSOR) 25 MG tablet   simvastatin (ZOCOR) 40 MG tablet   warfarin (COUMADIN) 4 MG tablet   warfarin  (COUMADIN) 7.5 MG tablet     Respiratory   COPD (chronic obstructive pulmonary disease) (HCC)    Stable today. Continue current regimen. Continue to monitor.         Endocrine   Diabetes mellitus type 2, diet-controlled ([place])    Newly diagnosed today with A1c of 6.6. Will get into the [facility] for diabetic education. Will get into eye exam. Will work on diet and exercise. BP and cholesterol under good control. Continue to monitor. Recheck 3 months.       Relevant Medications   lisinopril (PRINIVIL,ZESTRIL) 2.5 MG tablet   simvastatin (ZOCOR) 40 MG tablet   Other Relevant Orders   Ambulatory referral to diabetic education   Ambulatory referral to Ophthalmology   RESOLVED: IFG (impaired fasting glucose)    Levels came back today at diabetes.         Other   Hyperlipidemia    Under good control. Continue current regimen. Continue to monitor. Recheck 6 months.       Relevant Medications   furosemide (LASIX) 40 MG tablet   lisinopril (PRINIVIL,ZESTRIL) 2.5 MG tablet   metoprolol tartrate (LOPRESSOR) 25 MG tablet   simvastatin (ZOCOR) 40 MG tablet   warfarin (COUMADIN) 4 MG tablet   warfarin (COUMADIN) 7.5 MG tablet   H/O aortic valve replacement    On coumadin. Continue to monitor.       Personal history of tobacco use, presenting hazards to health    Has had lung cancer screening. Due again next year.       Chronic bilateral low back pain without sciatica    Likely also the cause of his leg cramps. Will check x-ray, likely arthritis. Pilar Plate conversation with patient today about the need for him to lose weight to help with his back pain. Exercises given today. Await results and treat as needed. Continue flexeril at this time.       Relevant Orders  DG Lumbar Spine Complete    Other Visit Diagnoses    Routine general medical examination at a health care facility    -  Primary   Up to date on vaccines- Rx for zoster given today. Screening labs checked today.  Colonoscopy scheduled for 10/31. Continue diet and exercise.   Slow urinary stream       Checking PSA today. Await results.    Immunization due       Flu shot given today. Rx for shingles given today.   Relevant Orders   Flu Vaccine QUAD 36+ mos PF IM (Fluarix & Fluzone Quad PF) (Completed)       Discussed aspirin prophylaxis for myocardial infarction prevention and decision was made to continue aspirin.  LABORATORY TESTING:  Health maintenance labs ordered today as discussed above.   The natural history of prostate cancer and ongoing controversy regarding screening and potential treatment outcomes of prostate cancer has been discussed with the patient. The meaning of a false positive PSA and a false negative PSA has been discussed. He indicates understanding of the limitations of this screening test and wishes to proceed with screening PSA testing.  IMMUNIZATIONS:   - Tdap: Tetanus vaccination status reviewed: last tetanus booster within 10 years. - Influenza: Administered today - Pneumovax: Up to date - Prevnar: Not applicable - Zostavax vaccine: Rx given today  SCREENING: - Colonoscopy: Getting it on Halloween  Discussed with patient purpose of the colonoscopy is to detect colon cancer at curable precancerous or early stages   - AAA Screening: Insurance declined  -Hearing Test: Not applicable  -Spirometry: Up to date   PATIENT COUNSELING:    Sexuality: Discussed sexually transmitted diseases, partner selection, use of condoms, avoidance of unintended pregnancy  and contraceptive alternatives.   Advised to avoid cigarette smoking.  I discussed with the patient that most people either abstain from alcohol or drink within safe limits (<=14/week and <=4 drinks/occasion for males, <=7/weeks and <= 3 drinks/occasion for females) and that the risk for alcohol disorders and other health effects rises proportionally with the number of drinks per week and how often a drinker exceeds  daily limits.  Discussed cessation/primary prevention of drug use and availability of treatment for abuse.   Diet: Encouraged to adjust caloric intake to maintain  or achieve ideal body weight, to reduce intake of dietary saturated fat and total fat, to limit sodium intake by avoiding high sodium foods and not adding table salt, and to maintain adequate dietary potassium and calcium preferably from fresh fruits, vegetables, and low-fat dairy products.    stressed the importance of regular exercise  Injury prevention: Discussed safety belts, safety helmets, smoke detector, smoking near bedding or upholstery.   Dental health: Discussed importance of regular tooth brushing, flossing, and dental visits.   Follow up plan: NEXT PREVENTATIVE PHYSICAL DUE IN 1 YEAR. Return in about 3 weeks (around 05/09/2016) for INR.

## 2016-04-19 NOTE — Patient Instructions (Addendum)
Health Maintenance, Male A healthy lifestyle and preventative care can promote health and wellness.  Maintain regular health, dental, and eye exams.  Eat a healthy diet. Foods like vegetables, fruits, whole grains, low-fat dairy products, and lean protein foods contain the nutrients you need and are low in calories. Decrease your intake of foods high in solid fats, added sugars, and salt. Get information about a proper diet from your health care provider, if necessary.  Regular physical exercise is one of the most important things you can do for your health. Most adults should get at least 150 minutes of moderate-intensity exercise (any activity that increases your heart rate and causes you to sweat) each week. In addition, most adults need muscle-strengthening exercises on 2 or more days a week.   Maintain a healthy weight. The body mass index (BMI) is a screening tool to identify possible weight problems. It provides an estimate of body fat based on height and weight. Your health care provider can find your BMI and can help you achieve or maintain a healthy weight. For males 20 years and older:  A BMI below 18.5 is considered underweight.  A BMI of 18.5 to 24.9 is normal.  A BMI of 25 to 29.9 is considered overweight.  A BMI of 30 and above is considered obese.  Maintain normal blood lipids and cholesterol by exercising and minimizing your intake of saturated fat. Eat a balanced diet with plenty of fruits and vegetables. Blood tests for lipids and cholesterol should begin at age 20 and be repeated every 5 years. If your lipid or cholesterol levels are high, you are over age 50, or you are at high risk for heart disease, you may need your cholesterol levels checked more frequently.Ongoing high lipid and cholesterol levels should be treated with medicines if diet and exercise are not working.  If you smoke, find out from your health care provider how to quit. If you do not use tobacco, do not  start.  Lung cancer screening is recommended for adults aged 55-80 years who are at high risk for developing lung cancer because of a history of smoking. A yearly low-dose CT scan of the lungs is recommended for people who have at least a 30-pack-year history of smoking and are current smokers or have quit within the past 15 years. A pack year of smoking is smoking an average of 1 pack of cigarettes a day for 1 year (for example, a 30-pack-year history of smoking could mean smoking 1 pack a day for 30 years or 2 packs a day for 15 years). Yearly screening should continue until the smoker has stopped smoking for at least 15 years. Yearly screening should be stopped for people who develop a health problem that would prevent them from having lung cancer treatment.  If you choose to drink alcohol, do not have more than 2 drinks per day. One drink is considered to be 12 oz (360 mL) of beer, 5 oz (150 mL) of wine, or 1.5 oz (45 mL) of liquor.  Avoid the use of street drugs. Do not share needles with anyone. Ask for help if you need support or instructions about stopping the use of drugs.  High blood pressure causes heart disease and increases the risk of stroke. High blood pressure is more likely to develop in:  People who have blood pressure in the end of the normal range (100-139/85-89 mm Hg).  People who are overweight or obese.  People who are African American.    If you are 18-39 years of age, have your blood pressure checked every 3-5 years. If you are 40 years of age or older, have your blood pressure checked every year. You should have your blood pressure measured twice--once when you are at a hospital or clinic, and once when you are not at a hospital or clinic. Record the average of the two measurements. To check your blood pressure when you are not at a hospital or clinic, you can use:  An automated blood pressure machine at a pharmacy.  A home blood pressure monitor.  If you are 45-79 years  old, ask your health care provider if you should take aspirin to prevent heart disease.  Diabetes screening involves taking a blood sample to check your fasting blood sugar level. This should be done once every 3 years after age 45 if you are at a normal weight and without risk factors for diabetes. Testing should be considered at a younger age or be carried out more frequently if you are overweight and have at least 1 risk factor for diabetes.  Colorectal cancer can be detected and often prevented. Most routine colorectal cancer screening begins at the age of 50 and continues through age 75. However, your health care provider may recommend screening at an earlier age if you have risk factors for colon cancer. On a yearly basis, your health care provider may provide home test kits to check for hidden blood in the stool. A small camera at the end of a tube may be used to directly examine the colon (sigmoidoscopy or colonoscopy) to detect the earliest forms of colorectal cancer. Talk to your health care provider about this at age 50 when routine screening begins. A direct exam of the colon should be repeated every 5-10 years through age 75, unless early forms of precancerous polyps or small growths are found.  People who are at an increased risk for hepatitis B should be screened for this virus. You are considered at high risk for hepatitis B if:  You were born in a country where hepatitis B occurs often. Talk with your health care provider about which countries are considered high risk.  Your parents were born in a high-risk country and you have not received a shot to protect against hepatitis B (hepatitis B vaccine).  You have HIV or AIDS.  You use needles to inject street drugs.  You live with, or have sex with, someone who has hepatitis B.  You are a man who has sex with other men (MSM).  You get hemodialysis treatment.  You take certain medicines for conditions like cancer, organ  transplantation, and autoimmune conditions.  Hepatitis C blood testing is recommended for all people born from 1945 through 1965 and any individual with known risk factors for hepatitis C.  Healthy men should no longer receive prostate-specific antigen (PSA) blood tests as part of routine cancer screening. Talk to your health care provider about prostate cancer screening.  Testicular cancer screening is not recommended for adolescents or adult males who have no symptoms. Screening includes self-exam, a health care provider exam, and other screening tests. Consult with your health care provider about any symptoms you have or any concerns you have about testicular cancer.  Practice safe sex. Use condoms and avoid high-risk sexual practices to reduce the spread of sexually transmitted infections (STIs).  You should be screened for STIs, including gonorrhea and chlamydia if:  You are sexually active and are younger than 24 years.  You   are older than 24 years, and your health care provider tells you that you are at risk for this type of infection.  Your sexual activity has changed since you were last screened, and you are at an increased risk for chlamydia or gonorrhea. Ask your health care provider if you are at risk.  If you are at risk of being infected with HIV, it is recommended that you take a prescription medicine daily to prevent HIV infection. This is called pre-exposure prophylaxis (PrEP). You are considered at risk if:  You are a man who has sex with other men (MSM).  You are a heterosexual man who is sexually active with multiple partners.  You take drugs by injection.  You are sexually active with a partner who has HIV.  Talk with your health care provider about whether you are at high risk of being infected with HIV. If you choose to begin PrEP, you should first be tested for HIV. You should then be tested every 3 months for as long as you are taking PrEP.  Use sunscreen. Apply  sunscreen liberally and repeatedly throughout the day. You should seek shade when your shadow is shorter than you. Protect yourself by wearing long sleeves, pants, a wide-brimmed hat, and sunglasses year round whenever you are outdoors.  Tell your health care provider of new moles or changes in moles, especially if there is a change in shape or color. Also, tell your health care provider if a mole is larger than the size of a pencil eraser.  A one-time screening for abdominal aortic aneurysm (AAA) and surgical repair of large AAAs by ultrasound is recommended for men aged 53-75 years who are current or former smokers.  Stay current with your vaccines (immunizations).   This information is not intended to replace advice given to you by your health care provider. Make sure you discuss any questions you have with your health care provider.   Document Released: 12/16/2007 Document Revised: 07/10/2014 Document Reviewed: 11/14/2010 Elsevier Interactive Patient Education 2016 Elsevier Inc. Influenza (Flu) Vaccine (Inactivated or Recombinant):  1. Why get vaccinated? Influenza ("flu") is a contagious disease that spreads around the [place] every year, usually between October and May. Flu is caused by influenza viruses, and is spread mainly by coughing, sneezing, and close contact. Anyone can get flu. Flu strikes suddenly and can last several days. Symptoms vary by age, but can include:  fever/chills  sore throat  muscle aches  fatigue  cough  headache  runny or stuffy nose Flu can also lead to pneumonia and blood infections, and cause diarrhea and seizures in children. If you have a medical condition, such as heart or lung disease, flu can make it worse. Flu is more dangerous for some people. Infants and young children, people 60 years of age and older, pregnant women, and people with certain health conditions or a weakened immune system are at greatest risk. Each year thousands of  people in the [place] die from flu, and many more are hospitalized. Flu vaccine can:  keep you from getting flu,  make flu less severe if you do get it, and  keep you from spreading flu to your family and other people. 2. Inactivated and recombinant flu vaccines A dose of flu vaccine is recommended every flu season. Children 6 months through 43 years of age may need two doses during the same flu season. Everyone else needs only one dose each flu season. Some inactivated flu vaccines contain a very  small amount of a mercury-based preservative called thimerosal. Studies have not shown thimerosal in vaccines to be harmful, but flu vaccines that do not contain thimerosal are available. There is no live flu virus in flu shots. They cannot cause the flu. There are many flu viruses, and they are always changing. Each year a new flu vaccine is made to protect against three or four viruses that are likely to cause disease in the upcoming flu season. But even when the vaccine doesn't exactly match these viruses, it may still provide some protection. Flu vaccine cannot prevent:  flu that is caused by a virus not covered by the vaccine, or  illnesses that look like flu but are not. It takes about 2 weeks for protection to develop after vaccination, and protection lasts through the flu season. 3. Some people should not get this vaccine Tell the person who is giving you the vaccine:  If you have any severe, life-threatening allergies. If you ever had a life-threatening allergic reaction after a dose of flu vaccine, or have a severe allergy to any part of this vaccine, you may be advised not to get vaccinated. Most, but not all, types of flu vaccine contain a small amount of egg protein.  If you ever had Guillain-Barre Syndrome (also called GBS). Some people with a history of GBS should not get this vaccine. This should be discussed with your doctor.  If you are not feeling well. It is usually okay  to get flu vaccine when you have a mild illness, but you might be asked to come back when you feel better. 4. Risks of a vaccine reaction With any medicine, including vaccines, there is a chance of reactions. These are usually mild and go away on their own, but serious reactions are also possible. Most people who get a flu shot do not have any problems with it. Minor problems following a flu shot include:  soreness, redness, or swelling where the shot was given  hoarseness  sore, red or itchy eyes  cough  fever  aches  headache  itching  fatigue If these problems occur, they usually begin soon after the shot and last 1 or 2 days. More serious problems following a flu shot can include the following:  There may be a small increased risk of Guillain-Barre Syndrome (GBS) after inactivated flu vaccine. This risk has been estimated at 1 or 2 additional cases per million people vaccinated. This is much lower than the risk of severe complications from flu, which can be prevented by flu vaccine.  Young children who get the flu shot along with pneumococcal vaccine (PCV13) and/or DTaP vaccine at the same time might be slightly more likely to have a seizure caused by fever. Ask your doctor for more information. Tell your doctor if a child who is getting flu vaccine has ever had a seizure. Problems that could happen after any injected vaccine:  People sometimes faint after a medical procedure, including vaccination. Sitting or lying down for about 15 minutes can help prevent fainting, and injuries caused by a fall. Tell your doctor if you feel dizzy, or have vision changes or ringing in the ears.  Some people get severe pain in the shoulder and have difficulty moving the arm where a shot was given. This happens very rarely.  Any medication can cause a severe allergic reaction. Such reactions from a vaccine are very rare, estimated at about 1 in a million doses, and would happen within a few  minutes  to a few hours after the vaccination. As with any medicine, there is a very remote chance of a vaccine causing a serious injury or death. The safety of vaccines is always being monitored. For more information, visit: http://www.aguilar.org/ 5. What if there is a serious reaction? What should I look for?  Look for anything that concerns you, such as signs of a severe allergic reaction, very high fever, or unusual behavior. Signs of a severe allergic reaction can include hives, swelling of the face and throat, difficulty breathing, a fast heartbeat, dizziness, and weakness. These would start a few minutes to a few hours after the vaccination. What should I do?  If you think it is a severe allergic reaction or other emergency that can't wait, call 9-1-1 and get the person to the nearest hospital. Otherwise, call your doctor.  Reactions should be reported to the Vaccine Adverse Event Reporting System (VAERS). Your doctor should file this report, or you can do it yourself through the VAERS web site at www.vaers.SamedayNews.es, or by calling 860-812-8215. VAERS does not give medical advice. 6. The National Vaccine Injury Compensation Program The [employer] Vaccine Injury Compensation Program (VICP) is a federal program that was created to compensate people who may have been injured by certain vaccines. Persons who believe they may have been injured by a vaccine can learn about the program and about filing a claim by calling [phone] or visiting the [place] website at GoldCloset.com.ee. There is a time limit to file a claim for compensation. 7. How can I learn more?  Ask your healthcare provider. He or she can give you the vaccine package insert or suggest other sources of information.  Call your local or state health department.  Contact the Centers for Disease Control and Prevention (CDC):  Call 707-769-6925 (1-800-CDC-INFO) or  Visit CDC's website at  https://gibson.com/ Vaccine Information Statement Inactivated Influenza Vaccine (02/06/2014)   This information is not intended to replace advice given to you by your health care provider. Make sure you discuss any questions you have with your health care provider.   Document Released: 04/13/2006 Document Revised: 07/10/2014 Document Reviewed: 02/09/2014 Elsevier Interactive Patient Education 2016 [place] Carbohydrate Counting for Diabetes Mellitus Carbohydrate counting is a method for keeping track of the amount of carbohydrates you eat. Eating carbohydrates naturally increases the level of sugar (glucose) in your blood, so it is important for you to know the amount that is okay for you to have in every meal. Carbohydrate counting helps keep the level of glucose in your blood within normal limits. The amount of carbohydrates allowed is different for every person. A dietitian can help you calculate the amount that is right for you. Once you know the amount of carbohydrates you can have, you can count the carbohydrates in the foods you want to eat. Carbohydrates are found in the following foods:  Grains, such as breads and cereals.  Dried beans and soy products.  Starchy vegetables, such as potatoes, peas, and corn.  Fruit and fruit juices.  Milk and yogurt.  Sweets and snack foods, such as cake, cookies, candy, chips, soft drinks, and fruit drinks. CARBOHYDRATE COUNTING There are two ways to count the carbohydrates in your food. You can use either of the methods or a combination of both. Reading the "Nutrition Facts" on [place] The "Nutrition Facts" is an area that is included on the labels of almost all packaged food and beverages in the [place]. It includes the serving size of that  food or beverage and information about the nutrients in each serving of the food, including the grams (g) of carbohydrate per serving.  Decide the number of servings of this food or  beverage that you will be able to eat or drink. Multiply that number of servings by the number of grams of carbohydrate that is listed on the label for that serving. The total will be the amount of carbohydrates you will be having when you eat or drink this food or beverage. Learning Standard Serving Sizes of Food When you eat food that is not packaged or does not include "Nutrition Facts" on the label, you need to measure the servings in order to count the amount of carbohydrates.A serving of most carbohydrate-rich foods contains about 15 g of carbohydrates. The following list includes serving sizes of carbohydrate-rich foods that provide 15 g ofcarbohydrate per serving:   1 slice of bread (1 oz) or 1 six-inch tortilla.    of a hamburger bun or English muffin.  4-6 crackers.   cup unsweetened dry cereal.    cup hot cereal.   cup rice or pasta.    cup mashed potatoes or  of a large baked potato.  1 cup fresh fruit or one small piece of fruit.    cup canned or frozen fruit or fruit juice.  1 cup milk.   cup plain fat-free yogurt or yogurt sweetened with artificial sweeteners.   cup cooked dried beans or starchy vegetable, such as peas, corn, or potatoes.  Decide the number of standard-size servings that you will eat. Multiply that number of servings by 15 (the grams of carbohydrates in that serving). For example, if you eat 2 cups of strawberries, you will have eaten 2 servings and 30 g of carbohydrates (2 servings x 15 g = 30 g). For foods such as soups and casseroles, in which more than one food is mixed in, you will need to count the carbohydrates in each food that is included. EXAMPLE OF CARBOHYDRATE COUNTING Sample Dinner  3 oz chicken breast.   cup of brown rice.   cup of corn.  1 cup milk.   1 cup strawberries with sugar-free whipped topping.  Carbohydrate Calculation Step 1: Identify the foods that contain carbohydrates:   Rice.   Corn.    Milk.   Strawberries. Step 2:Calculate the number of servings eaten of each:   2 servings of rice.   1 serving of corn.   1 serving of milk.   1 serving of strawberries. Step 3: Multiply each of those number of servings by 15 g:   2 servings of rice x 15 g = 30 g.   1 serving of corn x 15 g = 15 g.   1 serving of milk x 15 g = 15 g.   1 serving of strawberries x 15 g = 15 g. Step 4: Add together all of the amounts to find the total grams of carbohydrates eaten: 30 g + 15 g + 15 g + 15 g = 75 g.   This information is not intended to replace advice given to you by your health care provider. Make sure you discuss any questions you have with your health care provider.   Document Released: 06/19/2005 Document Revised: 07/10/2014 Document Reviewed: 05/16/2013 Elsevier Interactive Patient Education 2016 Elsevier Inc. Shingles Vaccine: What You Need to Know WHAT IS SHINGLES?  Shingles is a painful skin rash, often with blisters. It is also called Herpes Zoster or just Zoster.  A shingles rash usually appears on one side of the face or body and lasts from 2 to 4 weeks. Its main symptom is pain, which can be quite severe. Other symptoms of shingles can include fever, headache, chills, and upset stomach. Very rarely, a shingles infection can lead to pneumonia, hearing problems, blindness, brain inflammation (encephalitis), or death.  For about 1 person in 5, severe pain can continue even after the rash clears up. This is called post-herpetic neuralgia.  Shingles is caused by the Varicella Zoster virus. This is the same virus that causes chickenpox. Only someone who has had a case of chickenpox or rarely, has gotten chickenpox vaccine, can get shingles. The virus stays in your body. It can reappear many years later to cause a case of shingles.  You cannot catch shingles from another person with shingles. However, a person who has never had chickenpox (or chickenpox vaccine)  could get chickenpox from someone with shingles. This is not very common.  Shingles is far more common in people 70 and older than in younger people. It is also more common in people whose immune systems are weakened because of a disease such as cancer or drugs such as steroids or chemotherapy.  At least 1 million people get shingles per year in the [place]. SHINGLES VACCINE  A vaccine for shingles was licensed in 123456. In clinical trials, the vaccine reduced the risk of shingles by 50%. It can also reduce the pain in people who still get shingles after being vaccinated.  A single dose of shingles vaccine is recommended for adults 69 years of age and older. SOME PEOPLE SHOULD NOT GET SHINGLES VACCINE OR SHOULD WAIT A person should not get shingles vaccine if he or she:  Has ever had a life-threatening allergic reaction to gelatin, the antibiotic neomycin, or any other component of shingles vaccine. Tell your caregiver if you have any severe allergies.  Has a weakened immune system because of current:  AIDS or another disease that affects the immune system.  Treatment with drugs that affect the immune system, such as prolonged use of high-dose steroids.  Cancer treatment, such as radiation or chemotherapy.  Cancer affecting the bone marrow or lymphatic system, such as leukemia or lymphoma.  Is pregnant, or might be pregnant. Women should not become pregnant until at least 4 weeks after getting shingles vaccine. Someone with a minor illness, such as a cold, may be vaccinated. Anyone with a moderate or severe acute illness should usually wait until he or she recovers before getting the vaccine. This includes anyone with a temperature of 101.3 F (38 C) or higher. WHAT ARE THE RISKS FROM SHINGLES VACCINE?  A vaccine, like any medicine, could possibly cause serious problems, such as severe allergic reactions. However, the risk of a vaccine causing serious harm, or death, is extremely  small.  No serious problems have been identified with shingles vaccine. Mild Problems  Redness, soreness, swelling, or itching at the site of the injection (about 1 person in 3).  Headache (about 1 person in 93). Like all vaccines, shingles vaccine is being closely monitored for unusual or severe problems. WHAT IF THERE IS A MODERATE OR SEVERE REACTION? What should I look for? Any unusual condition, such as a severe allergic reaction or a high fever. If a severe allergic reaction occurred, it would be within a few minutes to an hour after the shot. Signs of a serious allergic reaction can include difficulty breathing, weakness, hoarseness or wheezing, a  fast heartbeat, hives, dizziness, paleness, or swelling of the throat. What should I do?  Call your caregiver, or get the person to a caregiver right away.  Tell the caregiver what happened, the date and time it happened, and when the vaccination was given.  Ask the caregiver to report the reaction by filing a Vaccine Adverse Event Reporting System (VAERS) form. Or, you can file this report through the VAERS web site at www.vaers.SamedayNews.es or by calling (408) [phone]. VAERS does not provide medical advice. HOW CAN I LEARN MORE?  Ask your caregiver. He or she can give you the vaccine package insert or suggest other sources of information.  Contact the Centers for Disease Control and Prevention (CDC):  Call 424-767-7788 (1-800-CDC-INFO).  Visit the CDC website at http://hunter.com/ CDC Shingles Vaccine VIS (04/07/08)   This information is not intended to replace advice given to you by your health care provider. Make sure you discuss any questions you have with your health care provider.   Document Released: 04/16/2006 Document Revised: 11/03/2014 Document Reviewed: 10/09/2012 Elsevier Interactive Patient Education 2016 [employer].  Back Exercises If you have pain in your back, do these exercises 2-3 times each day or as told by  your doctor. When the pain goes away, do the exercises once each day, but repeat the steps more times for each exercise (do more repetitions). If you do not have pain in your back, do these exercises once each day or as told by your doctor. EXERCISES Single Knee to Chest Do these steps 3-5 times in a row for each leg: 1. Lie on your back on a firm bed or the floor with your legs stretched out. 2. Bring one knee to your chest. 3. Hold your knee to your chest by grabbing your knee or thigh. 4. Pull on your knee until you feel a gentle stretch in your lower back. 5. Keep doing the stretch for 10-30 seconds. 6. Slowly let go of your leg and straighten it. Pelvic Tilt Do these steps 5-10 times in a row: 1. Lie on your back on a firm bed or the floor with your legs stretched out. 2. Bend your knees so they point up to the ceiling. Your feet should be flat on the floor. 3. Tighten your lower belly (abdomen) muscles to press your lower back against the floor. This will make your tailbone point up to the ceiling instead of pointing down to your feet or the floor. 4. Stay in this position for 5-10 seconds while you gently tighten your muscles and breathe evenly. Cat-Cow Do these steps until your lower back bends more easily: 1. Get on your hands and knees on a firm surface. Keep your hands under your shoulders, and keep your knees under your hips. You may put padding under your knees. 2. Let your head hang down, and make your tailbone point down to the floor so your lower back is round like the back of a cat. 3. Stay in this position for 5 seconds. 4. Slowly lift your head and make your tailbone point up to the ceiling so your back hangs low (sags) like the back of a cow. 5. Stay in this position for 5 seconds. Press-Ups Do these steps 5-10 times in a row: 1. Lie on your belly (face-down) on the floor. 2. Place your hands near your head, about shoulder-width apart. 3. While you keep your back  relaxed and keep your hips on the floor, slowly straighten your arms to raise the top  half of your body and lift your shoulders. Do not use your back muscles. To make yourself more comfortable, you may change where you place your hands. 4. Stay in this position for 5 seconds. 5. Slowly return to lying flat on the floor. Bridges Do these steps 10 times in a row: 1. Lie on your back on a firm surface. 2. Bend your knees so they point up to the ceiling. Your feet should be flat on the floor. 3. Tighten your butt muscles and lift your butt off of the floor until your waist is almost as high as your knees. If you do not feel the muscles working in your butt and the back of your thighs, slide your feet 1-2 inches farther away from your butt. 4. Stay in this position for 3-5 seconds. 5. Slowly lower your butt to the floor, and let your butt muscles relax. If this exercise is too easy, try doing it with your arms crossed over your chest. Belly Crunches Do these steps 5-10 times in a row: 1. Lie on your back on a firm bed or the floor with your legs stretched out. 2. Bend your knees so they point up to the ceiling. Your feet should be flat on the floor. 3. Cross your arms over your chest. 4. Tip your chin a little bit toward your chest but do not bend your neck. 5. Tighten your belly muscles and slowly raise your chest just enough to lift your shoulder blades a tiny bit off of the floor. 6. Slowly lower your chest and your head to the floor. Back Lifts Do these steps 5-10 times in a row: 1. Lie on your belly (face-down) with your arms at your sides, and rest your forehead on the floor. 2. Tighten the muscles in your legs and your butt. 3. Slowly lift your chest off of the floor while you keep your hips on the floor. Keep the back of your head in line with the curve in your back. Look at the floor while you do this. 4. Stay in this position for 3-5 seconds. 5. Slowly lower your chest and your face to  the floor. GET HELP IF:  Your back pain gets a lot worse when you do an exercise.  Your back pain does not lessen 2 hours after you exercise. If you have any of these problems, stop doing the exercises. Do not do them again unless your doctor says it is okay. GET HELP RIGHT AWAY IF:  You have sudden, very bad back pain. If this happens, stop doing the exercises. Do not do them again unless your doctor says it is okay.   This information is not intended to replace advice given to you by your health care provider. Make sure you discuss any questions you have with your health care provider.   Document Released: 07/22/2010 Document Revised: 03/10/2015 Document Reviewed: 08/13/2014 Elsevier Interactive Patient Education [employer].

## 2016-04-19 NOTE — Assessment & Plan Note (Signed)
Not interested in quitting right now. Discussed with patient today. Knows that we are here when he wants to quit.

## 2016-04-19 NOTE — Assessment & Plan Note (Signed)
Has had lung cancer screening. Due again next year.

## 2016-04-19 NOTE — Assessment & Plan Note (Signed)
Will keep BP and sugars and lipids within recommended range. Continue to monitor.

## 2016-04-19 NOTE — Assessment & Plan Note (Signed)
Saw vascular. Will continue to follow with them.

## 2016-04-19 NOTE — Assessment & Plan Note (Signed)
Likely also the cause of his leg cramps. Will check x-ray, likely arthritis. Pilar Plate conversation with patient today about the need for him to lose weight to help with his back pain. Exercises given today. Await results and treat as needed. Continue flexeril at this time.

## 2016-04-20 ENCOUNTER — Encounter: Payer: Self-pay | Admitting: Family Medicine

## 2016-04-20 LAB — CBC WITH DIFFERENTIAL/PLATELET
Basophils Absolute: 0.1 10*3/uL (ref 0.0–0.2)
Basos: 1 %
EOS (ABSOLUTE): 0.2 10*3/uL (ref 0.0–0.4)
Eos: 3 %
Hematocrit: 43.8 % (ref 37.5–51.0)
Hemoglobin: 15.1 g/dL (ref 12.6–17.7)
IMMATURE GRANULOCYTES: 0 %
Immature Grans (Abs): 0 10*3/uL (ref 0.0–0.1)
Lymphocytes Absolute: 2.4 10*3/uL (ref 0.7–3.1)
Lymphs: 31 %
MCH: 29.2 pg (ref 26.6–33.0)
MCHC: 34.5 g/dL (ref 31.5–35.7)
MCV: 85 fL (ref 79–97)
MONOS ABS: 0.8 10*3/uL (ref 0.1–0.9)
Monocytes: 10 %
NEUTROS PCT: 55 %
Neutrophils Absolute: 4.3 10*3/uL (ref 1.4–7.0)
PLATELETS: 251 10*3/uL (ref 150–379)
RBC: 5.17 x10E6/uL (ref 4.14–5.80)
RDW: 14.1 % (ref 12.3–15.4)
WBC: 7.8 10*3/uL (ref 3.4–10.8)

## 2016-04-20 LAB — COMPREHENSIVE METABOLIC PANEL
ALT: 23 IU/L (ref 0–44)
AST: 23 IU/L (ref 0–40)
Albumin/Globulin Ratio: 1.3 (ref 1.2–2.2)
Albumin: 4 g/dL (ref 3.6–4.8)
Alkaline Phosphatase: 51 IU/L (ref 39–117)
BUN/Creatinine Ratio: 13 (ref 10–24)
BUN: 11 mg/dL (ref 8–27)
Bilirubin Total: 0.3 mg/dL (ref 0.0–1.2)
CALCIUM: 8.9 mg/dL (ref 8.6–10.2)
CO2: 24 mmol/L (ref 18–29)
CREATININE: 0.84 mg/dL (ref 0.76–1.27)
Chloride: 101 mmol/L (ref 96–106)
GFR calc Af Amer: 109 mL/min/{1.73_m2} (ref >59)
GFR, EST NON AFRICAN AMERICAN: 95 mL/min/{1.73_m2} (ref >59)
Globulin, Total: 3.2 g/dL (ref 1.5–4.5)
Glucose: 111 mg/dL — ABNORMAL HIGH (ref 65–99)
POTASSIUM: 4.5 mmol/L (ref 3.5–5.2)
Sodium: 140 mmol/L (ref 134–144)
Total Protein: 7.2 g/dL (ref 6.0–8.5)

## 2016-04-20 LAB — PSA: Prostate Specific Ag, Serum: <0.1 ng/mL (ref 0.0–4.0)

## 2016-04-20 LAB — TSH: TSH: 2.01 u[IU]/mL (ref 0.450–4.500)

## 2016-04-21 ENCOUNTER — Telehealth: Payer: Self-pay | Admitting: Family Medicine

## 2016-04-21 ENCOUNTER — Ambulatory Visit
Admission: RE | Admit: 2016-04-21 | Discharge: 2016-04-21 | Disposition: A | Discharge status: Home / Self Care | Payer: Medicare Other | Source: Ambulatory Visit | Attending: Family Medicine | Admitting: Family Medicine

## 2016-04-21 DIAGNOSIS — M5137 Other intervertebral disc degeneration, lumbosacral region: Secondary | ICD-10-CM | POA: Insufficient documentation

## 2016-04-21 DIAGNOSIS — M5136 Other intervertebral disc degeneration, lumbar region: Secondary | ICD-10-CM | POA: Diagnosis not present

## 2016-04-21 DIAGNOSIS — G8929 Other chronic pain: Secondary | ICD-10-CM | POA: Insufficient documentation

## 2016-04-21 DIAGNOSIS — M545 Low back pain, unspecified: Secondary | ICD-10-CM

## 2016-04-21 NOTE — Telephone Encounter (Signed)
Please let Roberto [name] know that his x-ray shows arthritis at the bottom of his back. We'll keep to the plan of trying to lose a little weight and doing the exercises and see how he's doing in at his follow up. Thanks!

## 2016-04-21 NOTE — Telephone Encounter (Signed)
Patient notified

## 2016-05-01 ENCOUNTER — Emergency Department: Payer: Medicare Other

## 2016-05-01 ENCOUNTER — Inpatient Hospital Stay
Admission: EM | Admit: 2016-05-01 | Discharge: 2016-05-06 | DRG: 699 | Disposition: A | Discharge status: Home / Self Care | Payer: Medicare Other | Attending: Internal Medicine | Admitting: Internal Medicine

## 2016-05-01 ENCOUNTER — Telehealth: Payer: Self-pay | Admitting: Gastroenterology

## 2016-05-01 ENCOUNTER — Encounter: Payer: Self-pay | Admitting: Emergency Medicine

## 2016-05-01 DIAGNOSIS — Z7982 Long term (current) use of aspirin: Secondary | ICD-10-CM | POA: Diagnosis not present

## 2016-05-01 DIAGNOSIS — J449 Chronic obstructive pulmonary disease, unspecified: Secondary | ICD-10-CM | POA: Diagnosis present

## 2016-05-01 DIAGNOSIS — M545 Low back pain: Secondary | ICD-10-CM | POA: Diagnosis not present

## 2016-05-01 DIAGNOSIS — Z7951 Long term (current) use of inhaled steroids: Secondary | ICD-10-CM

## 2016-05-01 DIAGNOSIS — Z823 Family history of stroke: Secondary | ICD-10-CM | POA: Diagnosis not present

## 2016-05-01 DIAGNOSIS — I429 Cardiomyopathy, unspecified: Secondary | ICD-10-CM | POA: Diagnosis present

## 2016-05-01 DIAGNOSIS — Z800 Family history of malignant neoplasm of digestive organs: Secondary | ICD-10-CM

## 2016-05-01 DIAGNOSIS — N280 Ischemia and infarction of kidney: Secondary | ICD-10-CM | POA: Diagnosis not present

## 2016-05-01 DIAGNOSIS — E119 Type 2 diabetes mellitus without complications: Secondary | ICD-10-CM | POA: Diagnosis present

## 2016-05-01 DIAGNOSIS — R109 Unspecified abdominal pain: Secondary | ICD-10-CM | POA: Diagnosis not present

## 2016-05-01 DIAGNOSIS — Z952 Presence of prosthetic heart valve: Secondary | ICD-10-CM | POA: Diagnosis not present

## 2016-05-01 DIAGNOSIS — K921 Melena: Secondary | ICD-10-CM | POA: Diagnosis present

## 2016-05-01 DIAGNOSIS — K625 Hemorrhage of anus and rectum: Secondary | ICD-10-CM | POA: Diagnosis not present

## 2016-05-01 DIAGNOSIS — Z833 Family history of diabetes mellitus: Secondary | ICD-10-CM

## 2016-05-01 DIAGNOSIS — I509 Heart failure, unspecified: Secondary | ICD-10-CM | POA: Diagnosis present

## 2016-05-01 DIAGNOSIS — Z716 Tobacco abuse counseling: Secondary | ICD-10-CM | POA: Diagnosis not present

## 2016-05-01 DIAGNOSIS — K922 Gastrointestinal hemorrhage, unspecified: Secondary | ICD-10-CM

## 2016-05-01 DIAGNOSIS — Z8673 Personal history of transient ischemic attack (TIA), and cerebral infarction without residual deficits: Secondary | ICD-10-CM

## 2016-05-01 DIAGNOSIS — R10A1 Flank pain, right side: Secondary | ICD-10-CM

## 2016-05-01 DIAGNOSIS — Z8261 Family history of arthritis: Secondary | ICD-10-CM | POA: Diagnosis not present

## 2016-05-01 DIAGNOSIS — E7800 Pure hypercholesterolemia, unspecified: Secondary | ICD-10-CM | POA: Diagnosis present

## 2016-05-01 DIAGNOSIS — Z7901 Long term (current) use of anticoagulants: Secondary | ICD-10-CM

## 2016-05-01 DIAGNOSIS — I712 Thoracic aortic aneurysm, without rupture: Secondary | ICD-10-CM | POA: Diagnosis present

## 2016-05-01 DIAGNOSIS — I110 Hypertensive heart disease with heart failure: Secondary | ICD-10-CM | POA: Diagnosis present

## 2016-05-01 DIAGNOSIS — Z87891 Personal history of nicotine dependence: Secondary | ICD-10-CM | POA: Diagnosis not present

## 2016-05-01 DIAGNOSIS — Z5181 Encounter for therapeutic drug level monitoring: Secondary | ICD-10-CM | POA: Diagnosis not present

## 2016-05-01 DIAGNOSIS — N289 Disorder of kidney and ureter, unspecified: Secondary | ICD-10-CM

## 2016-05-01 DIAGNOSIS — N189 Chronic kidney disease, unspecified: Secondary | ICD-10-CM | POA: Diagnosis not present

## 2016-05-01 DIAGNOSIS — R079 Chest pain, unspecified: Secondary | ICD-10-CM | POA: Diagnosis not present

## 2016-05-01 HISTORY — DX: Heart failure, unspecified: I50.9

## 2016-05-01 HISTORY — DX: Presence of prosthetic heart valve: Z95.2

## 2016-05-01 LAB — COMPREHENSIVE METABOLIC PANEL
ALBUMIN: 4.4 g/dL (ref 3.5–5.0)
ALT: 26 U/L (ref 17–63)
AST: 35 U/L (ref 15–41)
Alkaline Phosphatase: 47 U/L (ref 38–126)
Anion gap: 9 (ref 5–15)
BUN: 25 mg/dL — AB (ref 6–20)
CHLORIDE: 99 mmol/L — AB (ref 101–111)
CO2: 25 mmol/L (ref 22–32)
CREATININE: 1.36 mg/dL — AB (ref 0.61–1.24)
Calcium: 9.7 mg/dL (ref 8.9–10.3)
GFR calc Af Amer: >60 mL/min (ref >60)
GFR calc non Af Amer: 55 mL/min — ABNORMAL LOW (ref >60)
Glucose, Bld: 152 mg/dL — ABNORMAL HIGH (ref 65–99)
POTASSIUM: 4.3 mmol/L (ref 3.5–5.1)
SODIUM: 133 mmol/L — AB (ref 135–145)
Total Bilirubin: 0.6 mg/dL (ref 0.3–1.2)
Total Protein: 8.3 g/dL — ABNORMAL HIGH (ref 6.5–8.1)

## 2016-05-01 LAB — APTT: APTT: 26 s (ref 24–36)

## 2016-05-01 LAB — URINALYSIS COMPLETE WITH MICROSCOPIC (ARMC ONLY)
BACTERIA UA: NONE SEEN
BILIRUBIN URINE: NEGATIVE
GLUCOSE, UA: NEGATIVE mg/dL
Ketones, ur: NEGATIVE mg/dL
Leukocytes, UA: NEGATIVE
Nitrite: NEGATIVE
PH: 5 (ref 5.0–8.0)
Protein, ur: NEGATIVE mg/dL
SQUAMOUS EPITHELIAL / LPF: NONE SEEN
Specific Gravity, Urine: 1.014 (ref 1.005–1.030)
WBC, UA: NONE SEEN WBC/hpf (ref 0–5)

## 2016-05-01 LAB — CBC
HEMATOCRIT: 45.8 % (ref 40.0–52.0)
Hemoglobin: 15.7 g/dL (ref 13.0–18.0)
MCH: 29.2 pg (ref 26.0–34.0)
MCHC: 34.2 g/dL (ref 32.0–36.0)
MCV: 85.4 fL (ref 80.0–100.0)
PLATELETS: 224 10*3/uL (ref 150–440)
RBC: 5.36 MIL/uL (ref 4.40–5.90)
RDW: 14.4 % (ref 11.5–14.5)
WBC: 14.2 10*3/uL — ABNORMAL HIGH (ref 3.8–10.6)

## 2016-05-01 LAB — PROTIME-INR
INR: 1.11
PROTHROMBIN TIME: 14.4 s (ref 11.4–15.2)

## 2016-05-01 LAB — LIPASE, BLOOD: LIPASE: 28 U/L (ref 11–51)

## 2016-05-01 MED ORDER — HYDROMORPHONE HCL 1 MG/ML IJ SOLN
1.0000 mg | INTRAMUSCULAR | Status: DC | PRN
  Administered 2016-05-01 – 2016-05-02 (×4): 1 mg via INTRAVENOUS
  Filled 2016-05-01 (×4): qty 1

## 2016-05-01 MED ORDER — ACETAMINOPHEN 325 MG PO TABS: 650.0000 mg | ORAL_TABLET | Freq: Four times a day (QID) | ORAL | Status: DC | PRN

## 2016-05-01 MED ORDER — ONDANSETRON HCL 4 MG/2ML IJ SOLN
4.0000 mg | Freq: Once | INTRAMUSCULAR | Status: AC
  Administered 2016-05-01: 4 mg via INTRAVENOUS
  Filled 2016-05-01: qty 2

## 2016-05-01 MED ORDER — HEPARIN BOLUS VIA INFUSION
4000.0000 [IU] | Freq: Once | INTRAVENOUS | Status: AC
  Administered 2016-05-01: 4000 [IU] via INTRAVENOUS
  Filled 2016-05-01: qty 4000

## 2016-05-01 MED ORDER — SIMVASTATIN 20 MG PO TABS
40.0000 mg | ORAL_TABLET | Freq: Every day | ORAL | Status: DC
  Administered 2016-05-02 – 2016-05-05 (×4): 40 mg via ORAL
  Filled 2016-05-01 (×5): qty 2

## 2016-05-01 MED ORDER — OXYCODONE-ACETAMINOPHEN 5-325 MG PO TABS
1.0000 | ORAL_TABLET | ORAL | Status: DC | PRN
  Administered 2016-05-01: 1 via ORAL
  Filled 2016-05-01: qty 1

## 2016-05-01 MED ORDER — ALBUTEROL SULFATE (2.5 MG/3ML) 0.083% IN NEBU
2.5000 mg | INHALATION_SOLUTION | Freq: Four times a day (QID) | RESPIRATORY_TRACT | Status: DC | PRN
  Filled 2016-05-01: qty 3

## 2016-05-01 MED ORDER — SODIUM CHLORIDE 0.9 % IV SOLN
INTRAVENOUS | Status: DC
  Administered 2016-05-01 – 2016-05-03 (×3): via INTRAVENOUS

## 2016-05-01 MED ORDER — HYDROMORPHONE HCL 1 MG/ML IJ SOLN
1.0000 mg | Freq: Once | INTRAMUSCULAR | Status: AC
  Administered 2016-05-01: 1 mg via INTRAVENOUS
  Filled 2016-05-01: qty 1

## 2016-05-01 MED ORDER — SODIUM CHLORIDE 0.9 % IV BOLUS (SEPSIS)
1000.0000 mL | Freq: Once | INTRAVENOUS | Status: AC
  Administered 2016-05-01: 1000 mL via INTRAVENOUS

## 2016-05-01 MED ORDER — METOPROLOL TARTRATE 25 MG PO TABS
25.0000 mg | ORAL_TABLET | Freq: Two times a day (BID) | ORAL | Status: DC
  Administered 2016-05-01 – 2016-05-06 (×10): 25 mg via ORAL
  Filled 2016-05-01 (×10): qty 1

## 2016-05-01 MED ORDER — OXYCODONE HCL 5 MG PO TABS
5.0000 mg | ORAL_TABLET | ORAL | Status: DC | PRN
  Administered 2016-05-01 – 2016-05-02 (×3): 5 mg via ORAL
  Filled 2016-05-01 (×3): qty 1

## 2016-05-01 MED ORDER — HYDROMORPHONE HCL 1 MG/ML IJ SOLN
INTRAMUSCULAR | Status: AC
  Administered 2016-05-01: 1 mg
  Filled 2016-05-01: qty 1

## 2016-05-01 MED ORDER — ACETAMINOPHEN 650 MG RE SUPP
650.0000 mg | Freq: Four times a day (QID) | RECTAL | Status: DC | PRN
  Filled 2016-05-01: qty 1

## 2016-05-01 MED ORDER — ALBUTEROL SULFATE HFA 108 (90 BASE) MCG/ACT IN AERS: 2.0000 | INHALATION_SPRAY | Freq: Four times a day (QID) | RESPIRATORY_TRACT | Status: DC | PRN

## 2016-05-01 MED ORDER — HYDROMORPHONE HCL 1 MG/ML IJ SOLN
1.0000 mg | Freq: Once | INTRAMUSCULAR | Status: DC
  Filled 2016-05-01: qty 1

## 2016-05-01 MED ORDER — PNEUMOCOCCAL VAC POLYVALENT 25 MCG/0.5ML IJ INJ: 0.5000 mL | INJECTION | INTRAMUSCULAR | Status: DC

## 2016-05-01 MED ORDER — TIOTROPIUM BROMIDE MONOHYDRATE 18 MCG IN CAPS
18.0000 ug | ORAL_CAPSULE | Freq: Every day | RESPIRATORY_TRACT | Status: DC
  Administered 2016-05-04 – 2016-05-06 (×3): 18 ug via RESPIRATORY_TRACT
  Filled 2016-05-01: qty 5

## 2016-05-01 MED ORDER — ORAL CARE MOUTH RINSE
15.0000 mL | Freq: Two times a day (BID) | OROMUCOSAL | Status: DC
  Administered 2016-05-02 – 2016-05-06 (×4): 15 mL via OROMUCOSAL

## 2016-05-01 MED ORDER — CYCLOBENZAPRINE HCL 10 MG PO TABS: 10.0000 mg | ORAL_TABLET | Freq: Two times a day (BID) | ORAL | Status: DC | PRN

## 2016-05-01 MED ORDER — HEPARIN (PORCINE) IN NACL 100-0.45 UNIT/ML-% IJ SOLN
3000.0000 [IU]/h | INTRAMUSCULAR | Status: DC
  Administered 2016-05-01: 1400 [IU]/h via INTRAVENOUS
  Administered 2016-05-02: 21:00:00 2000 [IU]/h via INTRAVENOUS
  Administered 2016-05-02: 08:00:00 1800 [IU]/h via INTRAVENOUS
  Administered 2016-05-03: 2400 [IU]/h via INTRAVENOUS
  Administered 2016-05-03: 09:00:00 2200 [IU]/h via INTRAVENOUS
  Administered 2016-05-04: 2600 [IU]/h via INTRAVENOUS
  Administered 2016-05-04: 16:00:00 2800 [IU]/h via INTRAVENOUS
  Administered 2016-05-05 – 2016-05-06 (×4): 3000 [IU]/h via INTRAVENOUS
  Filled 2016-05-01 (×14): qty 250

## 2016-05-01 MED ORDER — IOPAMIDOL (ISOVUE-370) INJECTION 76%
100.0000 mL | Freq: Once | INTRAVENOUS | Status: AC | PRN
  Administered 2016-05-01: 100 mL via INTRAVENOUS
  Filled 2016-05-01: qty 100

## 2016-05-01 MED ORDER — LUBRIDERM SERIOUSLY SENSITIVE EX LOTN: TOPICAL_LOTION | Freq: Two times a day (BID) | CUTANEOUS | Status: DC

## 2016-05-01 MED ORDER — HYDROCERIN EX CREA
TOPICAL_CREAM | Freq: Two times a day (BID) | CUTANEOUS | Status: DC
  Administered 2016-05-01 – 2016-05-06 (×8): via TOPICAL
  Filled 2016-05-01: qty 113

## 2016-05-01 MED ORDER — MOMETASONE FURO-FORMOTEROL FUM 200-5 MCG/ACT IN AERO
2.0000 | INHALATION_SPRAY | Freq: Two times a day (BID) | RESPIRATORY_TRACT | Status: DC
  Administered 2016-05-01 – 2016-05-06 (×8): 2 via RESPIRATORY_TRACT
  Filled 2016-05-01: qty 8.8

## 2016-05-01 MED ORDER — HYDROMORPHONE HCL 1 MG/ML IJ SOLN
1.0000 mg | Freq: Once | INTRAMUSCULAR | Status: AC
  Administered 2016-05-01: 1 mg via INTRAVENOUS

## 2016-05-01 NOTE — Telephone Encounter (Signed)
Patient is on his way to ER for his back so he will need to cancel his colonoscopy for tomorrow. Patient will call back to reschedule

## 2016-05-01 NOTE — ED Provider Notes (Addendum)
[facility] Emergency Department Provider Note  ____________________________________________  Time seen: Approximately 2:36 PM  I have reviewed the triage vital signs and the nursing notes.   HISTORY  Chief Complaint Flank Pain    HPI Roberto [name] is a 60 y.o. male with no abdominal surgical history, morbid obesity, thoracic aortic aneurysm, HTN, HL, CHF w/ AVR, DM 2, chronic low back painpresenting with right flank pain. The patient awoke in the middle of the night with a severe, constant, "dull" right flank pain that radiated slightly around to the right lower quadrant associated with nausea and he has had one episode of vomiting in the emergency department. No associated diarrhea or constipation, urinary symptoms, hematuria, dysuria or frequency. No fevers or chills. No associated cough or cold symptoms, chest pain, shortness of breath. The patient is unable to find a position that is comfortable.   Past Medical History:  Diagnosis Date  . Cellulitis   . CHF (congestive heart failure) ([place])   . Hypercholesterolemia   . Hypertension   . TIA (transient ischemic attack) 05/2014    Patient Active Problem List   Diagnosis Date Noted  . Diabetes mellitus type 2, diet-controlled ([place]) 04/19/2016  . Chronic bilateral low back pain without sciatica 04/19/2016  . Thoracic ascending aortic aneurysm ([place]) 03/16/2016  . Aortic atherosclerosis ([place]) 03/16/2016  . Personal history of tobacco use, presenting hazards to health 03/14/2016  . Cardiomyopathy ([place]) 03/02/2016  . H/O aortic valve replacement 03/02/2016  . COPD (chronic obstructive pulmonary disease) ([place]) 09/09/2015  . Mechanical heart valve present 02/09/2015  . Benign essential hypertension 02/09/2015  . Hyperlipidemia 02/09/2015    Past Surgical History:  Procedure Laterality Date  . AORTIC VALVE REPLACEMENT    . CARDIAC SURGERY  2009   CHF  . CARPAL TUNNEL RELEASE Left 2005  .  TONSILLECTOMY  1962    Current Outpatient Rx  . Order #: [id number] Class: Normal  . Order #: [id number] Class: Historical Med  . Order #: [id number] Class: Normal  . Order #: [id number] Class: Normal  . Order #: [id number] Class: Normal  . Order #: [id number] Class: Normal  . Order #: [id number] Class: Normal  . Order #: [id number] Class: Normal  . Order #: [id number] Class: Normal    Allergies Review of patient's allergies indicates no known allergies.  Family History  Problem Relation Age of Onset  . Arthritis Mother   . Cancer Mother   . Arthritis Father   . Diabetes Father   . Stroke Father   . Cancer Father   . Cancer Brother   . Cancer Sister     Social History Social History  Substance Use Topics  . Smoking status: Former Smoker    Packs/day: 2.00    Years: 40.00    Types: Cigarettes    Quit date: 12/02/2015  . Smokeless tobacco: Never Used  . Alcohol use No    Review of Systems Constitutional: No fever/chills.No lightheadedness or syncope. Eyes: No visual changes. ENT: No sore throat. No congestion or rhinorrhea. Cardiovascular: Denies chest pain. Denies palpitations. Respiratory: Denies shortness of breath.  No cough. Gastrointestinal: Positive right flank pain which radiates into the right lower quadrant.   Positive nausea, positive vomiting.  No diarrhea.  No constipation. Genitourinary: Negative for dysuria. No hematuria. No urinary frequency. No testicular or scrotal pain. Musculoskeletal: Positive right flank pain. Skin: Negative for rash. Neurological: Negative for headaches. No focal numbness, tingling or weakness.   10-point ROS otherwise negative.  ____________________________________________   PHYSICAL EXAM:  VITAL SIGNS:  ED Triage Vitals [05/01/16 1227]  Enc Vitals Group     BP (!) 159/101     Pulse Rate 69     Resp 20     Temp 98.1 F (36.7 C)     Temp Source Oral     SpO2 97 %     Weight (!) 350 lb (158.8 kg)     Height 5\' 6"  (1.676 m)      Head Circumference      Peak Flow      Pain Score 10     Pain Loc      Pain Edu?      Excl. in [place]?     Constitutional: Alert and oriented. Uncomfortable appearing with positional changes but in no acute distress. Answers questions appropriately. Eyes: Conjunctivae are normal.  EOMI. No scleral icterus. Head: Atraumatic. Nose: No congestion/rhinnorhea. Mouth/Throat: Mucous membranes are moist.  Neck: No stridor.  Supple.   Cardiovascular: Normal rate, regular rhythm. No murmurs, rubs or gallops.  Respiratory: Normal respiratory effort.  No accessory muscle use or retractions. Lungs CTAB, although the patient has grossly decreased breath sounds bilaterally which is likely due to body habitus which is limiting my examination..  No wheezes, rales or ronchi. Gastrointestinal: Morbidly obese. Soft and nondistended.  I'm unable to reproduce any tenderness to palpation in the abdomen. The patient does have some mild R CVA tenderness. Negative Murphy sign. No guarding or rebound.  No peritoneal signs. GU: No obvious external or palpable internal hemorrhoids. Stool is light brown. Guaiac positive. Musculoskeletal: No LE edema.  Neurologic:  A&Ox3.  Speech is clear.  Face and smile are symmetric.  EOMI.  Moves all extremities well. Skin:  Skin is warm, dry and intact. No rash noted. Psychiatric: Mood and affect are normal. Speech and behavior are normal.  Normal judgement.  ____________________________________________   LABS (all labs ordered are listed, but only abnormal results are displayed)  Labs Reviewed  COMPREHENSIVE METABOLIC PANEL - Abnormal; Notable for the following:       Result Value   Sodium 133 (*)    Chloride 99 (*)    Glucose, Bld 152 (*)    BUN 25 (*)    Creatinine, Ser 1.36 (*)    Total Protein 8.3 (*)    GFR calc non Af Amer 55 (*)    All other components within normal limits  CBC - Abnormal; Notable for the following:    WBC 14.2 (*)    All other components within  normal limits  URINALYSIS COMPLETEWITH MICROSCOPIC ([facility] ONLY) - Abnormal; Notable for the following:    Color, Urine YELLOW (*)    APPearance CLEAR (*)    Hgb urine dipstick 1+ (*)    All other components within normal limits  LIPASE, BLOOD  PROTIME-INR   ____________________________________________  EKG  ED ECG REPORT I, Eula Listen, the attending physician, personally viewed and interpreted this ECG.   Date: 05/01/2016  EKG Time: 1509  Rate: 87  Rhythm: normal sinus rhythm; + PVC  Axis: normal  Intervals:Prolonged QTc  ST&T Change: No STEMI  ____________________________________________  RADIOLOGY  Dg Chest 2 View  Result Date: 05/01/2016 CLINICAL DATA:  Right flank pain beginning at 2:30 a.m. today. No history of kidney stones or urinary symptoms. The patient reports mild chest pain. The patient was found to have an ascending thoracic aortic aneurysm on screening chest CT scan earlier this year. EXAM: CHEST  2 VIEW COMPARISON:  Chest CT scan of March 14, 2016 FINDINGS: The lungs are adequately inflated. The interstitial markings are mildly increased chronically. There is no alveolar infiltrate. There is no pleural effusion. The heart is top-normal in size but stable. The pulmonary vascularity is normal. There is tortuosity of the ascending and descending thoracic aorta. The patient has history previous aortic valve replacement. The observed bony thorax exhibits no acute abnormality. IMPRESSION: Mild stable cardiomegaly with stable interstitial prominence. No acute cardiopulmonary abnormality. Electronically Signed   By: David  Martinique M.D.   On: 05/01/2016 14:54   Ct Renal Stone Study  Result Date: 05/01/2016 CLINICAL DATA:  Right flank pain for several hours EXAM: CT ABDOMEN AND PELVIS WITHOUT CONTRAST TECHNIQUE: Multidetector CT imaging of the abdomen and pelvis was performed following the standard protocol without IV contrast. COMPARISON:  None. FINDINGS: Lower  chest: No acute abnormality. Changes consistent with prior valve replacement are noted. Hepatobiliary: Diffuse fatty infiltration of the liver is seen. A few areas of relatively normal density are seen consistent with areas of focal fatty sparing. Pancreas: Unremarkable. No pancreatic ductal dilatation or surrounding inflammatory changes. Spleen: Normal in size without focal abnormality. Adrenals/Urinary Tract: Adrenal glands are unremarkable. Kidneys are normal, without renal calculi, focal lesion, or hydronephrosis. Bladder is unremarkable. Stomach/Bowel: Stomach is within normal limits. Appendix appears normal. No evidence of bowel wall thickening, distention, or inflammatory changes. Vascular/Lymphatic: Aortic atherosclerosis. No enlarged abdominal or pelvic lymph nodes. Reproductive: Prostate is unremarkable. Other: No abdominal wall hernia or abnormality. No abdominopelvic ascites. Musculoskeletal: No acute or significant osseous findings. IMPRESSION: Diffuse fatty liver No evidence of renal calculi or obstructive changes. No acute abnormality is seen Electronically Signed   By: Inez Catalina M.D.   On: 05/01/2016 15:11   Ct Angio Chest/abd/pel For Dissection W And/or Wo Contrast  Result Date: 05/01/2016 CLINICAL DATA:  60 year old male with right flank pain since 0230 hours today. Initial encounter. EXAM: CT ANGIOGRAPHY CHEST, ABDOMEN AND PELVIS TECHNIQUE: Multidetector CT imaging through the chest, abdomen and pelvis was performed using the standard protocol during bolus administration of intravenous contrast. Multiplanar reconstructed images and MIPs were obtained and reviewed to evaluate the vascular anatomy. CONTRAST:  100 mL Isovue 370 COMPARISON:  Noncontrast CT Abdomen and Pelvis 1455 hours today. Noncontrast chest CT 03/14/2016. FINDINGS: CTA CHEST FINDINGS Cardiovascular: Calcified aortic atherosclerosis. Fusiform enlargement of the proximal ascending aorta appears stable since September, 50-51  mm diameter. No thoracic aortic dissection. Negative proximal great vessels aside from tortuosity. Mild for age calcified plaque in the thoracic aorta which is otherwise negative. Prosthetic aortic valve.  Calcified coronary artery atherosclerosis. No pericardial effusion. Mediastinum/Nodes: No mediastinal lymphadenopathy. Lungs/Pleura: No pleural effusion. Major airways are patent. Lung parenchyma is stable from the low-dose chest CT on 03/14/2016 (please see that report). Musculoskeletal: Sequelae of median sternotomy. No acute osseous abnormality identified. Review of the MIP images confirms the above findings. CTA ABDOMEN AND PELVIS FINDINGS VASCULAR The abdominal aorta is patent with mild for age calcified plaque. There is mild calcified plaque at the right renal artery origin (series 6, image 110). The main right renal artery remains patent. However, the right kidney is abnormally enhancing in appearance consistent with multiple renal infarcts (series 6, image 132). There is minimal to mild Renal hilar vessel level calcified plaque. No abdominal aortic aneurysm or dissection. Iliac artery calcified plaque which is more moderate and advanced than the plaque affecting the aorta. The bilateral iliac and proximal femoral artery is appear remain patent; contrast timing is suboptimal in the lower pelvis. Review  of the MIP images confirms the above findings. NON-VASCULAR Hepatobiliary: Diffuse fatty liver disease. Mildly distended but otherwise negative gallbladder. Pancreas: Negative. Spleen: Negative. Adrenals/Urinary Tract: Negative adrenal glands. The left kidney is normal. The right kidney is abnormal with multiple peripheral and wedge-shaped areas of hypoenhancement in keeping with right renal infarcts. The midpole is relatively spared. No hydronephrosis or hydroureter.  No abdominal free fluid. Negative urinary bladder. Stomach/Bowel: Decompressed rectosigmoid colon. Mildly redundant sigmoid. Decompressed left  colon. Much of the transverse colon is decompressed. The hepatic flexure is redundant. Negative right colon and appendix. Negative terminal ileum. No dilated small bowel. Negative stomach and duodenum. Lymphatic: No lymphadenopathy. Reproductive: Negative. Other: No pelvic free fluid Musculoskeletal: Severe lower lumbar spine facet degeneration with vacuum facet. Advanced L5-S1 disc and endplate degeneration. No acute osseous abnormality identified. IMPRESSION: 1. Positive for multiple right renal infarcts. There is calcified aortic and renal artery atherosclerosis, but this seems average or somewhat below average for patient age. This patient has a prosthetic aortic valve. Perhaps he is inadequately anticoagulated. This was discussed by telephone with Dr. Eula Listen on 05/01/2016 at 17:25 . 2. Stable fusiform enlargement of the ascending aorta since the chest CT on 03/14/2016. No aortic dissection. 3. More moderate calcified atherosclerosis of the iliac and proximal femoral arteries which appear to remain patent (pelvic vascular contrast timing is suboptimal). 4. Calcified coronary artery atherosclerosis. 5. Fatty liver disease. Electronically Signed   By: Genevie Ann M.D.   On: 05/01/2016 17:30    ____________________________________________   PROCEDURES  Procedure(s) performed: None  Procedures  Critical Care performed: Yes ____________________________________________   INITIAL IMPRESSION / ASSESSMENT AND PLAN / ED COURSE  Pertinent labs & imaging results that were available during my care of the patient were reviewed by me and considered in my medical decision making (see chart for details).  61 y.o. male with morbid obesity, thoracic aneurysm, CHF and aortic valve replacement, presenting with right flank pain associated with nausea and vomiting which started abruptly in the middle the night. Overall, the patient does have some difficult to getting comfortable and renal colic would be  at the time of my differential. He does not have any blood in his urine, but he does have an elevated white blood cell count and an acute renal insufficiency. We'll get a CT scan to evaluate for this. Given his history of thoracic aneurysm, I'm also concerned about thoracic dissection which may involve the renal artery resulting in renal insufficiency, but this is less likely. We'll also get an x-ray of the chest to evaluate for any lung pathology which may be causing the patient's symptoms, although he is not having any chest pain or shortness of breath, nor fever or cough which would be more consistent with pneumonia. The patient's morbid obesity does limit my examination. We will initiate symptomatic treatment and reevaluate the patient for final disposition.  ----------------------------------------- 4:22 PM on 05/01/2016 -----------------------------------------  At this time, the patient states that the Dilaudid initially helped his pain but it is now coming back. His renal stone CT did not show any acute abnormalities. He still is very uncomfortable, so I'm concerned about his aorta and need to rule out aortic dissection prior to discharge. It is possible he has musculoskeletal strain, especially given that he is a deconditioned patient with morbid obesity and multiple comorbidities, but aortic dissection does need to be ruled out prior to discharge. I am now learning that the patient has been fasting for colonoscopy, and this may  explain his renal insufficiency as he has not been eating or drinking. He reports that he's had some minimal bright red blood similar to previous hemorrhoids but only when he wipes after a bowel movement. His vital signs are stable and is hemoglobin and hematocrit are stable as well. His colonoscopy is scheduled for tomorrow, so further evaluation for the bleeding in the emergency department is not indicated.  ----------------------------------------- 5:28 PM on  05/01/2016 -----------------------------------------  I've been called by the radiologist, Dr. Nevada Crane, who is reading the patient's CT angiogram. The patient has multiple areas of infarction in the right kidney without any obvious mural thrombus or large clots in the aorta or renal artery. He does not have an aortic dissection. I have made a call to the vascular surgeon for further treatment options. The patient is likely at risk for this given his aortic valve replacement, or his aneurysm, especially since he has been holding his Coumadin for upcoming colonoscopy. The patient's INR is still pending in the laboratory some awaiting the results of that as well.   ----------------------------------------- 5:35 PM on 05/01/2016 -----------------------------------------  I spoken with Dr. Lucky Cowboy, the vascular surgeon on-call. At this time, there is no additional intervention either surgically or from an interventional radiology standpoint, for the renal infarct. If the patient is under coagulated, heparin may be indicated but I will let the admitting doctors follow the INR and make the best decision to balance his GI bleed with his renal infarcts when they have additional data.  CRITICAL CARE Performed by: Eula Listen   Total critical care time: 50 minutes  Critical care time was exclusive of separately billable procedures and treating other patients.  Critical care was necessary to treat or prevent imminent or life-threatening deterioration.  Critical care was time spent personally by me on the following activities: development of treatment plan with patient and/or surrogate as well as nursing, discussions with consultants, evaluation of patient's response to treatment, examination of patient, obtaining history from patient or surrogate, ordering and performing treatments and interventions, ordering and review of laboratory studies, ordering and review of radiographic studies, pulse oximetry  and re-evaluation of patient's condition.  ____________________________________________  FINAL CLINICAL IMPRESSION(S) / ED DIAGNOSES  Final diagnoses:  Right flank pain  Gastrointestinal hemorrhage, unspecified gastrointestinal hemorrhage type  Renal infarct [facility])  Acute renal insufficiency    Clinical Course      NEW MEDICATIONS STARTED DURING THIS VISIT:  New Prescriptions   No medications on file      Eula Listen, MD 05/01/16 1736    Eula Listen, MD 05/01/16 1737    Eula Listen, MD 05/01/16 1746

## 2016-05-01 NOTE — ED Triage Notes (Signed)
Pt reports right flank pain since 0230 today, denies hx of kidney stones, denies urinary sx.

## 2016-05-01 NOTE — H&P (Signed)
[facility] NAME: Roberto [name]    MR#:  [id number]  DATE OF BIRTH:  1956-07-02  DATE OF ADMISSION:  05/01/2016  PRIMARY CARE PHYSICIAN: Park Liter, DO   REQUESTING/REFERRING PHYSICIAN: Dr Aundria Rud  CHIEF COMPLAINT:   Chief Complaint  Patient presents with  . Flank Pain    HISTORY OF PRESENT ILLNESS:  Roberto [name]  is a 60 y.o. male with a known history Of an aortic mechanical valve on Coumadin for anticoagulation. His Coumadin was on hold for the last 5 days because he was scheduled to have a colonoscopy tomorrow. Colonoscopy scheduled because he sees bright red blood when he wipes. He was on no bridging anticoagulation. He woke up this morning at 2:30 AM with severe pain in his right flank and back. Pain 10 out of 10 in intensity. Described as a sharp ache which was very intense. The pain medications here has helped. He cannot find a comfortable position. Today he had some nausea vomiting. In the ER, he first had a CAT scan for kidney stone protocol that was negative. Then he had a CT scan with contrast that showed renal infarcts on the right side. His INR is 1.11 because he is off his Coumadin. Hospitalist services contacted for further evaluation.  PAST MEDICAL HISTORY:   Past Medical History:  Diagnosis Date  . Cellulitis   . CHF (congestive heart failure) ([place])   . Diabetes mellitus without complication ([place])   . H/O mechanical aortic valve replacement   . Hypercholesterolemia   . Hypertension   . TIA (transient ischemic attack) 05/2014    PAST SURGICAL HISTORY:   Past Surgical History:  Procedure Laterality Date  . AORTIC VALVE REPLACEMENT    . CARDIAC SURGERY  2009   CHF  . CARPAL TUNNEL RELEASE Left 2005  . TONSILLECTOMY  1962    SOCIAL HISTORY:   Social History  Substance Use Topics  . Smoking status: Former Smoker    Packs/day: 2.00    Years: 40.00    Types: Cigarettes     Quit date: 12/02/2015  . Smokeless tobacco: Never Used  . Alcohol use No    FAMILY HISTORY:   Family History  Problem Relation Age of Onset  . Arthritis Mother   . Cancer Mother   . Arthritis Father   . Diabetes Father   . Stroke Father   . Cancer Father   . Cancer Brother   . Cancer Sister     DRUG ALLERGIES:  No Known Allergies  REVIEW OF SYSTEMS:  CONSTITUTIONAL: No fever. Positive for chills and sweats. Positive for fatigue. Positive for weight gain 15 pounds. EYES: No blurred or double vision. Positive for cataracts. EARS, NOSE, AND THROAT: No tinnitus or ear pain. No sore throat. Positive for decreased hearing. RESPIRATORY: Positive for dry cough. No shortness of breath, wheezing or hemoptysis.  CARDIOVASCULAR: No chest pain. Some edema.  GASTROINTESTINAL: Positive for nausea, vomiting, and right flank and back pain. No diarrhea or abdominal pain. Positive bright red blood when wiping. GENITOURINARY: No dysuria, hematuria. Urine is very dark. ENDOCRINE: No polyuria, nocturia,  HEMATOLOGY: No anemia, easy bruising or bleeding SKIN: Dry skin with itching. MUSCULOSKELETAL: Positive joint pains and pains down his left leg NEUROLOGIC: No tingling, numbness, weakness.  PSYCHIATRY: No anxiety or depression.   MEDICATIONS AT HOME:   Prior to Admission medications   Medication Sig Start Date End Date Taking? Authorizing Provider  albuterol (PROVENTIL HFA;VENTOLIN  HFA) 108 (90 Base) MCG/ACT inhaler Inhale 2 puffs into the lungs every 6 (six) hours as needed for wheezing or shortness of breath. 09/03/15  Yes [name], DO  aspirin 81 MG chewable tablet Chew 81 mg by mouth daily.     Yes Historical Provider, MD  cyclobenzaprine (FLEXERIL) 10 MG tablet Take 1 tablet (10 mg total) by mouth 3 (three) times daily as needed for muscle spasms. Patient taking differently: Take 10 mg by mouth 2 (two) times daily as needed for muscle spasms.  03/20/16  Yes [name], DO   Fluticasone-Salmeterol (ADVAIR) 250-50 MCG/DOSE AEPB Inhale 1 puff into the lungs 2 (two) times daily.   Yes Historical Provider, MD  furosemide (LASIX) 40 MG tablet Take 1 tablet (40 mg total) by mouth daily. 04/19/16  Yes [name], DO  lisinopril (PRINIVIL,ZESTRIL) 2.5 MG tablet Take 1 tablet (2.5 mg total) by mouth daily. Patient taking differently: Take 2.5 mg by mouth at bedtime.  04/19/16  Yes [name], DO  metoprolol tartrate (LOPRESSOR) 25 MG tablet Take 1 tablet (25 mg total) by mouth 2 (two) times daily. 04/19/16  Yes [name], DO  simvastatin (ZOCOR) 40 MG tablet TAKE 1 TABLET (40 MG TOTAL) BY MOUTH AT BEDTIME. 04/19/16  Yes [name], DO  tiotropium (SPIRIVA) 18 MCG inhalation capsule Place 18 mcg into inhaler and inhale daily.   Yes Historical Provider, MD  warfarin (COUMADIN) 4 MG tablet Take 1 tablet (4 mg total) by mouth daily. Patient taking differently: Take 4 mg by mouth at bedtime.  04/19/16  Yes [name], DO  warfarin (COUMADIN) 7.5 MG tablet Take 1 tablet (7.5 mg total) by mouth daily. Patient taking differently: Take 7.5 mg by mouth at bedtime.  04/19/16  Yes [name], DO      VITAL SIGNS:  Blood pressure (!) 134/98, pulse 79, temperature 98.2 F (36.8 C), temperature source Oral, resp. rate 20, height 5\' 6"  (1.676 m), weight (!) 158.8 kg (350 lb), SpO2 90 %.  PHYSICAL EXAMINATION:  GENERAL:  60 y.o.-year-old patient lying in the bed That looks very uncomfortable and can't find a comfortable position EYES: Pupils equal, round, reactive to light and accommodation. No scleral icterus. Extraocular muscles intact.  HEENT: Head atraumatic, normocephalic. Oropharynx and nasopharynx clear.  NECK:  Supple, no jugular venous distention. No thyroid enlargement, no tenderness.  LUNGS: Normal breath sounds bilaterally, no wheezing, rales,rhonchi or crepitation. No use of accessory muscles of respiration.  CARDIOVASCULAR: S1, S2 normal with  metallic aortic valve sound. . A 6 systolic murmurs. No rubs, or gallops.  ABDOMEN: Soft, nontender, nondistended. Bowel sounds present. No organomegaly or mass.  EXTREMITIES: 2+ edema. No cyanosis, or clubbing.  NEUROLOGIC: Cranial nerves II through XII are intact. Muscle strength 5/5 in all extremities. Sensation intact. Gait not checked.  PSYCHIATRIC: The patient is alert and oriented x 3.  SKIN: No rash, lesion, or ulcer. Dry skin and scaling bilateral legs and feet  LABORATORY PANEL:   CBC  Recent Labs Lab 05/01/16 1230  WBC 14.2*  HGB 15.7  HCT 45.8  PLT 224   ------------------------------------------------------------------------------------------------------------------  Chemistries   Recent Labs Lab 05/01/16 1230  NA 133*  K 4.3  CL 99*  CO2 25  GLUCOSE 152*  BUN 25*  CREATININE 1.36*  CALCIUM 9.7  AST 35  ALT 26  ALKPHOS 47  BILITOT 0.6   ------------------------------------------------------------------------------------------------------------------    RADIOLOGY:  Dg Chest 2 View  Result Date: 05/01/2016 CLINICAL DATA:  Right flank pain beginning at 2:30 a.m. today. No history of kidney stones or urinary symptoms. The patient reports mild chest pain. The patient was found to have an ascending thoracic aortic aneurysm on screening chest CT scan earlier this year. EXAM: CHEST  2 VIEW COMPARISON:  Chest CT scan of March 14, 2016 FINDINGS: The lungs are adequately inflated. The interstitial markings are mildly increased chronically. There is no alveolar infiltrate. There is no pleural effusion. The heart is top-normal in size but stable. The pulmonary vascularity is normal. There is tortuosity of the ascending and descending thoracic aorta. The patient has history previous aortic valve replacement. The observed bony thorax exhibits no acute abnormality. IMPRESSION: Mild stable cardiomegaly with stable interstitial prominence. No acute cardiopulmonary  abnormality. Electronically Signed   By: David  Martinique M.D.   On: 05/01/2016 14:54   Ct Renal Stone Study  Result Date: 05/01/2016 CLINICAL DATA:  Right flank pain for several hours EXAM: CT ABDOMEN AND PELVIS WITHOUT CONTRAST TECHNIQUE: Multidetector CT imaging of the abdomen and pelvis was performed following the standard protocol without IV contrast. COMPARISON:  None. FINDINGS: Lower chest: No acute abnormality. Changes consistent with prior valve replacement are noted. Hepatobiliary: Diffuse fatty infiltration of the liver is seen. A few areas of relatively normal density are seen consistent with areas of focal fatty sparing. Pancreas: Unremarkable. No pancreatic ductal dilatation or surrounding inflammatory changes. Spleen: Normal in size without focal abnormality. Adrenals/Urinary Tract: Adrenal glands are unremarkable. Kidneys are normal, without renal calculi, focal lesion, or hydronephrosis. Bladder is unremarkable. Stomach/Bowel: Stomach is within normal limits. Appendix appears normal. No evidence of bowel wall thickening, distention, or inflammatory changes. Vascular/Lymphatic: Aortic atherosclerosis. No enlarged abdominal or pelvic lymph nodes. Reproductive: Prostate is unremarkable. Other: No abdominal wall hernia or abnormality. No abdominopelvic ascites. Musculoskeletal: No acute or significant osseous findings. IMPRESSION: Diffuse fatty liver No evidence of renal calculi or obstructive changes. No acute abnormality is seen Electronically Signed   By: Inez Catalina M.D.   On: 05/01/2016 15:11   Ct Angio Chest/abd/pel For Dissection W And/or Wo Contrast  Result Date: 05/01/2016 CLINICAL DATA:  60 year old male with right flank pain since 0230 hours today. Initial encounter. EXAM: CT ANGIOGRAPHY CHEST, ABDOMEN AND PELVIS TECHNIQUE: Multidetector CT imaging through the chest, abdomen and pelvis was performed using the standard protocol during bolus administration of intravenous contrast.  Multiplanar reconstructed images and MIPs were obtained and reviewed to evaluate the vascular anatomy. CONTRAST:  100 mL Isovue 370 COMPARISON:  Noncontrast CT Abdomen and Pelvis 1455 hours today. Noncontrast chest CT 03/14/2016. FINDINGS: CTA CHEST FINDINGS Cardiovascular: Calcified aortic atherosclerosis. Fusiform enlargement of the proximal ascending aorta appears stable since September, 50-51 mm diameter. No thoracic aortic dissection. Negative proximal great vessels aside from tortuosity. Mild for age calcified plaque in the thoracic aorta which is otherwise negative. Prosthetic aortic valve.  Calcified coronary artery atherosclerosis. No pericardial effusion. Mediastinum/Nodes: No mediastinal lymphadenopathy. Lungs/Pleura: No pleural effusion. Major airways are patent. Lung parenchyma is stable from the low-dose chest CT on 03/14/2016 (please see that report). Musculoskeletal: Sequelae of median sternotomy. No acute osseous abnormality identified. Review of the MIP images confirms the above findings. CTA ABDOMEN AND PELVIS FINDINGS VASCULAR The abdominal aorta is patent with mild for age calcified plaque. There is mild calcified plaque at the right renal artery origin (series 6, image 110). The main right renal artery remains patent. However, the right kidney is abnormally enhancing in appearance consistent with  multiple renal infarcts (series 6, image 132). There is minimal to mild Renal hilar vessel level calcified plaque. No abdominal aortic aneurysm or dissection. Iliac artery calcified plaque which is more moderate and advanced than the plaque affecting the aorta. The bilateral iliac and proximal femoral artery is appear remain patent; contrast timing is suboptimal in the lower pelvis. Review of the MIP images confirms the above findings. NON-VASCULAR Hepatobiliary: Diffuse fatty liver disease. Mildly distended but otherwise negative gallbladder. Pancreas: Negative. Spleen: Negative. Adrenals/Urinary  Tract: Negative adrenal glands. The left kidney is normal. The right kidney is abnormal with multiple peripheral and wedge-shaped areas of hypoenhancement in keeping with right renal infarcts. The midpole is relatively spared. No hydronephrosis or hydroureter.  No abdominal free fluid. Negative urinary bladder. Stomach/Bowel: Decompressed rectosigmoid colon. Mildly redundant sigmoid. Decompressed left colon. Much of the transverse colon is decompressed. The hepatic flexure is redundant. Negative right colon and appendix. Negative terminal ileum. No dilated small bowel. Negative stomach and duodenum. Lymphatic: No lymphadenopathy. Reproductive: Negative. Other: No pelvic free fluid Musculoskeletal: Severe lower lumbar spine facet degeneration with vacuum facet. Advanced L5-S1 disc and endplate degeneration. No acute osseous abnormality identified. IMPRESSION: 1. Positive for multiple right renal infarcts. There is calcified aortic and renal artery atherosclerosis, but this seems average or somewhat below average for patient age. This patient has a prosthetic aortic valve. Perhaps he is inadequately anticoagulated. This was discussed by telephone with Dr. Eula Listen on 05/01/2016 at 17:25 . 2. Stable fusiform enlargement of the ascending aorta since the chest CT on 03/14/2016. No aortic dissection. 3. More moderate calcified atherosclerosis of the iliac and proximal femoral arteries which appear to remain patent (pelvic vascular contrast timing is suboptimal). 4. Calcified coronary artery atherosclerosis. 5. Fatty liver disease. Electronically Signed   By: Genevie Ann M.D.   On: 05/01/2016 17:30    EKG:   Normal sinus rhythm 87 bpm, left atrial enlargement  IMPRESSION AND PLAN:   1. Acute right renal infarcts. INR today is 1.11. The only way to get his pain under control is starting heparin drip at this point. Hopefully tomorrow can restart his Coumadin if no GI bleeding. Case discussed with pharmacist  to dose heparin. Case discussed with nursing staff in the ER to start the heparin. 2. Rectal bleeding. Hemoglobin stable since May. Serial hemoglobins Consult GI. Patient was scheduled to have a colonoscopy tomorrow as outpatient. Likely this will have to be rescheduled or tried while here. I will leave on full liquid diet for right now. 3. History of TIA. Hold aspirin for right now 4. Type 2 diabetes mellitus without complication. Sliding scale monitor. 5. Essential hypertension continue metoprolol 6. With renal infarcts and creatinine slightly elevated in a dye study will give gentle IV fluid hydration for right now. Hold Lasix and lisinopril.  All the records are reviewed and case discussed with ED provider. Management plans discussed with the patient, family and they are in agreement.  CODE STATUS: Full code  TOTAL TIME TAKING CARE OF THIS PATIENT: 55 minutes.    Loletha Grayer M.D on 05/01/2016 at 7:31 PM  Between 7am to 6pm - Pager - [phone]  After 6pm call admission pager [phone]  [facility] Office  [phone]  CC: Primary care physician; Park Liter, DO

## 2016-05-01 NOTE — Telephone Encounter (Signed)
Noted. Contacted [facility] Endo unit and confirmed cancellation.

## 2016-05-01 NOTE — Progress Notes (Addendum)
ANTICOAGULATION CONSULT NOTE - Initial Consult  Pharmacy Consult for Heparin drip Indication: renal infarct, mechanical valve  No Known Allergies  Patient Measurements: Height: 5\' 6"  (167.6 cm) Weight: (!) 350 lb (158.8 kg) IBW/kg (Calculated) : 63.8 Heparin Dosing Weight: 103.5 kg  Vital Signs: Temp: 98.2 F (36.8 C) (10/30 1918) Temp Source: Oral (10/30 1918) BP: 134/98 (10/30 1918) Pulse Rate: 79 (10/30 1918)  Labs:  Recent Labs  05/01/16 1230  HGB 15.7  HCT 45.8  PLT 224  LABPROT 14.4  INR 1.11  CREATININE 1.36*    Estimated Creatinine Clearance: 82.1 mL/min (by C-G formula based on SCr of 1.36 mg/dL (H)).   Medical History: Past Medical History:  Diagnosis Date  . Cellulitis   . CHF (congestive heart failure) ([place])   . Diabetes mellitus without complication ([place])   . H/O mechanical aortic valve replacement   . Hypercholesterolemia   . Hypertension   . TIA (transient ischemic attack) 05/2014      Assessment: 60 yo male usually on warfarin for mechanical aortic valve but being held for colonoscopy now here with renal infarct. Per ED MD note possible GI bleed? Spoke with Dr. Leslye Peer who states may be hemorrhoids, pt with brown stool; ok to bolus heparin and provide usual dosing. Hgb 15.7, plt 224, INR 1.11.  Per lab cannot add on aPTT at this time, asked for stat draw   Goal of Therapy:  Heparin level 0.3-0.7 units/ml Monitor platelets by anticoagulation protocol: Yes   Plan:  Heparin bolus 4000 units IV x1 then heparin drip at 1400 units/hr (=14 ml/hr) Heparin level in 6h CBC in AM  10/31 AM heparin level 0.11. 3100 unit bolus and increase rate to 1800 units/hr. Recheck in 6 hours.  Pharmacy will continue to follow.   Rayna Sexton L 05/01/2016,7:35 PM

## 2016-05-02 ENCOUNTER — Telehealth: Payer: Self-pay | Admitting: Gastroenterology

## 2016-05-02 ENCOUNTER — Ambulatory Visit: Admission: RE | Admit: 2016-05-02 | Payer: Medicare Other | Source: Ambulatory Visit | Admitting: Gastroenterology

## 2016-05-02 ENCOUNTER — Telehealth: Payer: Self-pay | Admitting: Family Medicine

## 2016-05-02 ENCOUNTER — Encounter: Admission: RE | Payer: Self-pay | Source: Ambulatory Visit

## 2016-05-02 LAB — CBC
HCT: 43.4 % (ref 40.0–52.0)
Hemoglobin: 14.6 g/dL (ref 13.0–18.0)
MCH: 28.8 pg (ref 26.0–34.0)
MCHC: 33.5 g/dL (ref 32.0–36.0)
MCV: 86 fL (ref 80.0–100.0)
PLATELETS: 212 10*3/uL (ref 150–440)
RBC: 5.05 MIL/uL (ref 4.40–5.90)
RDW: 14.8 % — ABNORMAL HIGH (ref 11.5–14.5)
WBC: 15.5 10*3/uL — ABNORMAL HIGH (ref 3.8–10.6)

## 2016-05-02 LAB — HEPARIN LEVEL (UNFRACTIONATED)
HEPARIN UNFRACTIONATED: 0.11 [IU]/mL — AB (ref 0.30–0.70)
Heparin Unfractionated: 0.33 IU/mL (ref 0.30–0.70)
Heparin Unfractionated: 0.23 IU/mL — ABNORMAL LOW (ref 0.30–0.70)

## 2016-05-02 LAB — HEMOGLOBIN
HEMOGLOBIN: 14.9 g/dL (ref 13.0–18.0)
Hemoglobin: 14.4 g/dL (ref 13.0–18.0)
Hemoglobin: 14.9 g/dL (ref 13.0–18.0)

## 2016-05-02 LAB — BASIC METABOLIC PANEL
Anion gap: 7 (ref 5–15)
BUN: 24 mg/dL — ABNORMAL HIGH (ref 6–20)
CALCIUM: 8.8 mg/dL — AB (ref 8.9–10.3)
CO2: 25 mmol/L (ref 22–32)
CREATININE: 1.01 mg/dL (ref 0.61–1.24)
Chloride: 105 mmol/L (ref 101–111)
GFR calc non Af Amer: >60 mL/min (ref >60)
Glucose, Bld: 130 mg/dL — ABNORMAL HIGH (ref 65–99)
Potassium: 4 mmol/L (ref 3.5–5.1)
SODIUM: 137 mmol/L (ref 135–145)

## 2016-05-02 SURGERY — COLONOSCOPY WITH PROPOFOL
Anesthesia: General

## 2016-05-02 MED ORDER — OXYCODONE HCL 5 MG PO TABS
10.0000 mg | ORAL_TABLET | ORAL | Status: DC | PRN
  Administered 2016-05-02 – 2016-05-06 (×7): 10 mg via ORAL
  Filled 2016-05-02 (×9): qty 2

## 2016-05-02 MED ORDER — WARFARIN - PHARMACIST DOSING INPATIENT
Freq: Every day | Status: DC
  Administered 2016-05-02 – 2016-05-05 (×3)

## 2016-05-02 MED ORDER — WARFARIN SODIUM 10 MG PO TABS
10.0000 mg | ORAL_TABLET | Freq: Once | ORAL | Status: AC
  Administered 2016-05-02: 18:00:00 10 mg via ORAL
  Filled 2016-05-02: qty 1

## 2016-05-02 MED ORDER — HEPARIN BOLUS VIA INFUSION
3100.0000 [IU] | Freq: Once | INTRAVENOUS | Status: AC
  Administered 2016-05-02: 3100 [IU] via INTRAVENOUS
  Filled 2016-05-02: qty 3100

## 2016-05-02 MED ORDER — HEPARIN BOLUS VIA INFUSION
1600.0000 [IU] | Freq: Once | INTRAVENOUS | Status: AC
  Administered 2016-05-02: 19:00:00 1600 [IU] via INTRAVENOUS
  Filled 2016-05-02: qty 1600

## 2016-05-02 MED ORDER — HYDROMORPHONE HCL 1 MG/ML IJ SOLN
2.0000 mg | INTRAMUSCULAR | Status: DC | PRN
  Administered 2016-05-02 – 2016-05-05 (×14): 2 mg via INTRAVENOUS
  Filled 2016-05-02 (×15): qty 2

## 2016-05-02 NOTE — Progress Notes (Signed)
ANTICOAGULATION CONSULT NOTE - Initial Consult  Pharmacy Consult for Warfarin Dosing Indication: Aortic Mechanical Valve and Renal infarction  No Known Allergies  Patient Measurements: Height: 5\' 6"  (167.6 cm) Weight: (!) 339 lb 12.8 oz (154.1 kg) IBW/kg (Calculated) : 63.8 Heparin Dosing Weight:    Vital Signs: Temp: 97.7 F (36.5 C) (10/31 0541) Temp Source: Oral (10/31 0541) BP: 157/82 (10/31 0840) Pulse Rate: 84 (10/31 0840)  Labs:  Recent Labs  05/01/16 1230 05/01/16 1930  05/02/16 0313 05/02/16 0450 05/02/16 1236  HGB 15.7  --   < > 14.6 14.4  --   HCT 45.8  --   --  43.4  --   --   PLT 224  --   --  212  --   --   APTT  --  26  --   --   --   --   LABPROT 14.4  --   --   --   --   --   INR 1.11  --   --   --   --   --   HEPARINUNFRC  --   --   --   --  0.11* 0.33  CREATININE 1.36*  --   --  1.01  --   --   < > = values in this interval not displayed.  Estimated Creatinine Clearance: 108.5 mL/min (by C-G formula based on SCr of 1.01 mg/dL).   Medical History: Past Medical History:  Diagnosis Date  . Cellulitis   . CHF (congestive heart failure) ([place])   . Diabetes mellitus without complication ([place])   . H/O mechanical aortic valve replacement   . Hypercholesterolemia   . Hypertension   . TIA (transient ischemic attack) 05/2014    Medications:  Infusions:  . sodium chloride 50 mL/hr at 05/02/16 0735  . heparin 1,800 Units/hr (05/02/16 0735)    Assessment: Patient admitted for renal infarction. Patient has mechanical aortic valve and was taking Warfarin 3.75mg  (1/2 7.5mg  tablet) for 2 days then 7.5mg  for 1 day. His therapy was stopped in preparation for a colonoscopy without any bridging. Initiated on Heparin Infusion and now being restarted on Warfarin.  10/30 INR 1.11  No warfarin given  Goal of Therapy:  INR 2.5 - 3.5 Monitor platelets by anticoagulation protocol: Yes   Plan:  Will give Warfarin 10mg  tonight and check INR in the morning.    Paulina Fusi, PharmD, BCPS 05/02/2016 1:59 PM

## 2016-05-02 NOTE — Plan of Care (Signed)
Problem: Pain Managment: Goal: General experience of comfort will improve Outcome: Progressing Pain controlled temporarily throughout the day by alternating Dilaudid and Oxy IR. Emotional support given and frequent repositioning.

## 2016-05-02 NOTE — Progress Notes (Addendum)
[facility] NAME: Roberto [name]    MR#:  [id number]  DATE OF BIRTH:  13-Mar-1956  SUBJECTIVE:  Came in with back pain severe,right. 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  HENT: Negative for ear discharge, ear pain and nosebleeds.   Eyes: Negative for blurred vision, pain and discharge.  Respiratory: Negative for sputum production, shortness of breath, wheezing and stridor.   Cardiovascular: Negative for chest pain, palpitations, orthopnea and PND.  Gastrointestinal: Negative for abdominal pain, diarrhea, nausea and vomiting.  Genitourinary: Negative for frequency and urgency.  Musculoskeletal: Positive for back pain. Negative for joint pain.  Neurological: Negative for sensory change, speech change, focal weakness and weakness.  Psychiatric/Behavioral: Negative for depression and hallucinations. The patient is not nervous/anxious.    Tolerating Diet:yes Tolerating PT: ambulatory  DRUG ALLERGIES:  No Known Allergies  VITALS:  Blood pressure (!) 157/82, pulse 84, temperature 97.7 F (36.5 C), temperature source Oral, resp. rate 18, height 5\' 6"  (1.676 m), weight (!) 154.1 kg (339 lb 12.8 oz), SpO2 94 %.  PHYSICAL EXAMINATION:   Physical Exam  GENERAL:  60 y.o.-year-old patient lying in the bed with no acute distress.  EYES: Pupils equal, round, reactive to light and accommodation. No scleral icterus. Extraocular muscles intact.  HEENT: Head atraumatic, normocephalic. Oropharynx and nasopharynx clear.  NECK:  Supple, no jugular venous distention. No thyroid enlargement, no tenderness.  LUNGS: Normal breath sounds bilaterally, no wheezing, rales, rhonchi. No use of accessory muscles of respiration.  CARDIOVASCULAR: S1, S2 normal. No murmurs, rubs, or gallops.  ABDOMEN: Soft, nontender, nondistended. Bowel sounds present. No  organomegaly or mass.  EXTREMITIES: No cyanosis, clubbing or edema b/l.    NEUROLOGIC: Cranial nerves II through XII are intact. No focal Motor or sensory deficits b/l.   PSYCHIATRIC:  patient is alert and oriented x 3.  SKIN: No obvious rash, lesion, or ulcer.   LABORATORY PANEL:  CBC  Recent Labs Lab 05/02/16 0313 05/02/16 0450  WBC 15.5*  --   HGB 14.6 14.4  HCT 43.4  --   PLT 212  --     Chemistries   Recent Labs Lab 05/01/16 1230 05/02/16 0313  NA 133* 137  K 4.3 4.0  CL 99* 105  CO2 25 25  GLUCOSE 152* 130*  BUN 25* 24*  CREATININE 1.36* 1.01  CALCIUM 9.7 8.8*  AST 35  --   ALT 26  --   ALKPHOS 47  --   BILITOT 0.6  --    Cardiac Enzymes No results for input(s): TROPONINI in the last 168 hours. RADIOLOGY:  Dg Chest 2 View  Result Date: 05/01/2016 CLINICAL DATA:  Right flank pain beginning at 2:30 a.m. today. No history of kidney stones or urinary symptoms. The patient reports mild chest pain. The patient was found to have an ascending thoracic aortic aneurysm on screening chest CT scan earlier this year. EXAM: CHEST  2 VIEW COMPARISON:  Chest CT scan of March 14, 2016 FINDINGS: The lungs are adequately inflated. The interstitial markings are mildly increased chronically. There is no alveolar infiltrate. There is no pleural effusion. The heart is top-normal in size but stable. The pulmonary vascularity is normal. There is tortuosity of the ascending and descending thoracic aorta. The patient has history previous aortic valve replacement. The observed bony thorax exhibits no acute abnormality. IMPRESSION: Mild stable  cardiomegaly with stable interstitial prominence. No acute cardiopulmonary abnormality. Electronically Signed   By: David  Martinique M.D.   On: 05/01/2016 14:54   Ct Renal Stone Study  Result Date: 05/01/2016 CLINICAL DATA:  Right flank pain for several hours EXAM: CT ABDOMEN AND PELVIS WITHOUT CONTRAST TECHNIQUE: Multidetector CT imaging of the  abdomen and pelvis was performed following the standard protocol without IV contrast. COMPARISON:  None. FINDINGS: Lower chest: No acute abnormality. Changes consistent with prior valve replacement are noted. Hepatobiliary: Diffuse fatty infiltration of the liver is seen. A few areas of relatively normal density are seen consistent with areas of focal fatty sparing. Pancreas: Unremarkable. No pancreatic ductal dilatation or surrounding inflammatory changes. Spleen: Normal in size without focal abnormality. Adrenals/Urinary Tract: Adrenal glands are unremarkable. Kidneys are normal, without renal calculi, focal lesion, or hydronephrosis. Bladder is unremarkable. Stomach/Bowel: Stomach is within normal limits. Appendix appears normal. No evidence of bowel wall thickening, distention, or inflammatory changes. Vascular/Lymphatic: Aortic atherosclerosis. No enlarged abdominal or pelvic lymph nodes. Reproductive: Prostate is unremarkable. Other: No abdominal wall hernia or abnormality. No abdominopelvic ascites. Musculoskeletal: No acute or significant osseous findings. IMPRESSION: Diffuse fatty liver No evidence of renal calculi or obstructive changes. No acute abnormality is seen Electronically Signed   By: Inez Catalina M.D.   On: 05/01/2016 15:11   Ct Angio Chest/abd/pel For Dissection W And/or Wo Contrast  Result Date: 05/01/2016 CLINICAL DATA:  60 year old male with right flank pain since 0230 hours today. Initial encounter. EXAM: CT ANGIOGRAPHY CHEST, ABDOMEN AND PELVIS TECHNIQUE: Multidetector CT imaging through the chest, abdomen and pelvis was performed using the standard protocol during bolus administration of intravenous contrast. Multiplanar reconstructed images and MIPs were obtained and reviewed to evaluate the vascular anatomy. CONTRAST:  100 mL Isovue 370 COMPARISON:  Noncontrast CT Abdomen and Pelvis 1455 hours today. Noncontrast chest CT 03/14/2016. FINDINGS: CTA CHEST FINDINGS Cardiovascular:  Calcified aortic atherosclerosis. Fusiform enlargement of the proximal ascending aorta appears stable since September, 50-51 mm diameter. No thoracic aortic dissection. Negative proximal great vessels aside from tortuosity. Mild for age calcified plaque in the thoracic aorta which is otherwise negative. Prosthetic aortic valve.  Calcified coronary artery atherosclerosis. No pericardial effusion. Mediastinum/Nodes: No mediastinal lymphadenopathy. Lungs/Pleura: No pleural effusion. Major airways are patent. Lung parenchyma is stable from the low-dose chest CT on 03/14/2016 (please see that report). Musculoskeletal: Sequelae of median sternotomy. No acute osseous abnormality identified. Review of the MIP images confirms the above findings. CTA ABDOMEN AND PELVIS FINDINGS VASCULAR The abdominal aorta is patent with mild for age calcified plaque. There is mild calcified plaque at the right renal artery origin (series 6, image 110). The main right renal artery remains patent. However, the right kidney is abnormally enhancing in appearance consistent with multiple renal infarcts (series 6, image 132). There is minimal to mild Renal hilar vessel level calcified plaque. No abdominal aortic aneurysm or dissection. Iliac artery calcified plaque which is more moderate and advanced than the plaque affecting the aorta. The bilateral iliac and proximal femoral artery is appear remain patent; contrast timing is suboptimal in the lower pelvis. Review of the MIP images confirms the above findings. NON-VASCULAR Hepatobiliary: Diffuse fatty liver disease. Mildly distended but otherwise negative gallbladder. Pancreas: Negative. Spleen: Negative. Adrenals/Urinary Tract: Negative adrenal glands. The left kidney is normal. The right kidney is abnormal with multiple peripheral and wedge-shaped areas of hypoenhancement in keeping with right renal infarcts. The midpole is relatively spared. No hydronephrosis or hydroureter.  No abdominal free  fluid. Negative urinary bladder. Stomach/Bowel: Decompressed rectosigmoid colon. Mildly redundant sigmoid. Decompressed left colon. Much of the transverse colon is decompressed. The hepatic flexure is redundant. Negative right colon and appendix. Negative terminal ileum. No dilated small bowel. Negative stomach and duodenum. Lymphatic: No lymphadenopathy. Reproductive: Negative. Other: No pelvic free fluid Musculoskeletal: Severe lower lumbar spine facet degeneration with vacuum facet. Advanced L5-S1 disc and endplate degeneration. No acute osseous abnormality identified. IMPRESSION: 1. Positive for multiple right renal infarcts. There is calcified aortic and renal artery atherosclerosis, but this seems average or somewhat below average for patient age. This patient has a prosthetic aortic valve. Perhaps he is inadequately anticoagulated. This was discussed by telephone with Dr. Eula Listen on 05/01/2016 at 17:25 . 2. Stable fusiform enlargement of the ascending aorta since the chest CT on 03/14/2016. No aortic dissection. 3. More moderate calcified atherosclerosis of the iliac and proximal femoral arteries which appear to remain patent (pelvic vascular contrast timing is suboptimal). 4. Calcified coronary artery atherosclerosis. 5. Fatty liver disease. Electronically Signed   By: Genevie Ann M.D.   On: 05/01/2016 17:30   ASSESSMENT AND PLAN:   Roberto [name]  is a 60 y.o. male with a known history Of an aortic mechanical valve on Coumadin for anticoagulation. His Coumadin was on hold for the last 5 days because he was scheduled to have a colonoscopy tomorrow. Colonoscopy scheduled because he sees bright red blood when he wipes. He was on no bridging anticoagulation. He woke up this morning at 2:30 AM with severe pain in his right flank and back. Pain 10 out of 10 in intensity  1. Acute right renal infarcts due to stopping coumadin w/o any bridging with IV heparin (for colonoscopy) - INR today is  1.11. -resumed heparin drip at this point.  -will resume warfarin -spoke with GI dr Allen Norris. No colonoscopy at this point -d/w Dr dew recommends cont heparin and coumadin  2. Rectal bleeding. Hemoglobin stable since May.  -no procedure since no active bleeding hgb stable -on heparin and coumadin  3. History of TIA. Hold aspirin for right now  4. Type 2 diabetes mellitus without complication. Sliding scale monitor.  5. Essential hypertension continue metoprolol  6. With renal infarcts and creatinine slightly elevated in a dye study will give gentle IV fluid hydration for right now. Hold Lasix and lisinopril.   Case discussed with Care Management/Social Worker. Management plans discussed with the patient, family and they are in agreement.  CODE STATUS: full  DVT Prophylaxis: IV heparin gtt and coumadin  TOTAL TIME TAKING CARE OF THIS PATIENT: 30 minutes.  >50% time spent on counselling and coordination of care pt, Drs Lucky Cowboy and [place] D/C IN 1-2 DAYS, DEPENDING ON CLINICAL CONDITION.  Note: This dictation was prepared with Dragon dictation along with smaller phrase technology. Any transcriptional errors that result from this process are unintentional.  Laqueisha Catalina M.D on 05/02/2016 at 2:36 PM  Between 7am to 6pm - Pager - [phone]  After 6pm go to [email or web address] - password [email or web address] [facility]  Office  [phone]  CC: Primary care physician; Park Liter, DO

## 2016-05-02 NOTE — Telephone Encounter (Signed)
Vangie Bicker would like for you to call her regarding this patient. He is in the hospital with blood clots in his kidney from being of his warfarin per Vangie Bicker

## 2016-05-02 NOTE — Progress Notes (Signed)
ANTICOAGULATION CONSULT NOTE - Initial Consult  Pharmacy Consult for Heparin drip Indication: renal infarct, mechanical valve  No Known Allergies  Patient Measurements: Height: 5\' 6"  (167.6 cm) Weight: (!) 339 lb 12.8 oz (154.1 kg) IBW/kg (Calculated) : 63.8 Heparin Dosing Weight: 103.5 kg  Vital Signs: Temp: 97.7 F (36.5 C) (10/31 0541) Temp Source: Oral (10/31 0541) BP: 157/82 (10/31 0840) Pulse Rate: 84 (10/31 0840)  Labs:  Recent Labs  05/01/16 1230 05/01/16 1930  05/02/16 0313 05/02/16 0450 05/02/16 1236  HGB 15.7  --   < > 14.6 14.4  --   HCT 45.8  --   --  43.4  --   --   PLT 224  --   --  212  --   --   APTT  --  26  --   --   --   --   LABPROT 14.4  --   --   --   --   --   INR 1.11  --   --   --   --   --   HEPARINUNFRC  --   --   --   --  0.11* 0.33  CREATININE 1.36*  --   --  1.01  --   --   < > = values in this interval not displayed.  Estimated Creatinine Clearance: 108.5 mL/min (by C-G formula based on SCr of 1.01 mg/dL).   Medical History: Past Medical History:  Diagnosis Date  . Cellulitis   . CHF (congestive heart failure) ([place])   . Diabetes mellitus without complication ([place])   . H/O mechanical aortic valve replacement   . Hypercholesterolemia   . Hypertension   . TIA (transient ischemic attack) 05/2014      Assessment: 60 yo male usually on warfarin for mechanical aortic valve but being held for colonoscopy now here with renal infarct. Per ED MD note possible GI bleed? Spoke with Dr. Leslye Peer who states may be hemorrhoids, pt with brown stool; ok to bolus heparin and provide usual dosing. Hgb 15.7, plt 224, INR 1.11.  Per lab cannot add on aPTT at this time, asked for stat draw   Goal of Therapy:  Heparin level 0.3-0.7 units/ml Monitor platelets by anticoagulation protocol: Yes   Plan:  Heparin bolus 4000 units IV x1 then heparin drip at 1400 units/hr (=14 ml/hr) Heparin level in 6h CBC in AM  10/31 AM heparin level 0.11. 3100  unit bolus and increase rate to 1800 units/hr. Recheck in 6 hours.  10/31 1230 Heparin level 0.33, will continue with current rate and check confirmation level in 6 hr.  Pharmacy will continue to follow.   Paulina Fusi, PharmD, BCPS 05/02/2016 1:53 PM

## 2016-05-02 NOTE — Telephone Encounter (Signed)
Pt's girlfriend called to notify that pt is in the hospital in Room # 114 due to blood clots in his kidneys due to being off of his Warfarin for so long. Please call pt's girlfriend with any other questions. Thanks

## 2016-05-02 NOTE — Consult Note (Signed)
Consultation  Referring Provider:     No ref. provider found Primary Care Physician:  Park Liter, DO Primary Gastroenterologist:  Dr. Truman Hayward         Reason for Consultation:     Blood in his stool with hematochezia.  Date of Admission:  05/01/2016 Date of Consultation:  05/02/2016         HPI:   Roberto [name] is a 60 y.o. male with a H/O blood in his stool with hematochezia . 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   Past Medical History:  Diagnosis Date  . Cellulitis   . CHF (congestive heart failure) ([place])   . Diabetes mellitus without complication ([place])   . H/O mechanical aortic valve replacement   . Hypercholesterolemia   . Hypertension   . TIA (transient ischemic attack) 05/2014    Past Surgical History:  Procedure Laterality Date  . AORTIC VALVE REPLACEMENT    . CARDIAC SURGERY  2009   CHF  . CARPAL TUNNEL RELEASE Left 2005  . TONSILLECTOMY  1962    Prior to Admission medications   Medication Sig Start Date End Date Taking? Authorizing Provider  albuterol (PROVENTIL HFA;VENTOLIN HFA) 108 (90 Base) MCG/ACT inhaler Inhale 2 puffs into the lungs every 6 (six) hours as needed for wheezing or shortness of breath. 09/03/15  Yes [name], DO  aspirin 81 MG chewable tablet Chew 81 mg by mouth daily.     Yes Historical Provider, MD  cyclobenzaprine (FLEXERIL) 10 MG tablet Take 1 tablet (10 mg total) by mouth 3 (three) times daily as needed for muscle spasms. Patient taking differently: Take 10 mg by mouth 2 (two) times daily as needed for muscle spasms.  03/20/16  Yes [name], DO  Fluticasone-Salmeterol (ADVAIR) 250-50 MCG/DOSE AEPB Inhale 1 puff into the lungs 2 (two) times daily.   Yes Historical Provider, MD  furosemide (LASIX) 40 MG tablet Take 1 tablet (40 mg total) by mouth daily. 04/19/16  Yes [name], DO  lisinopril  (PRINIVIL,ZESTRIL) 2.5 MG tablet Take 1 tablet (2.5 mg total) by mouth daily. Patient taking differently: Take 2.5 mg by mouth at bedtime.  04/19/16  Yes [name], DO  metoprolol tartrate (LOPRESSOR) 25 MG tablet Take 1 tablet (25 mg total) by mouth 2 (two) times daily. 04/19/16  Yes [name], DO  simvastatin (ZOCOR) 40 MG tablet TAKE 1 TABLET (40 MG TOTAL) BY MOUTH AT BEDTIME. 04/19/16  Yes [name], DO  tiotropium (SPIRIVA) 18 MCG inhalation capsule Place 18 mcg into inhaler and inhale daily.   Yes Historical Provider, MD  warfarin (COUMADIN) 4 MG tablet Take 1 tablet (4 mg total) by mouth daily. Patient taking differently: Take 4 mg by mouth at bedtime.  04/19/16  Yes [name], DO  warfarin (COUMADIN) 7.5 MG tablet Take 1 tablet (7.5 mg total) by mouth daily. Patient taking differently: Take 7.5 mg by mouth at bedtime.  04/19/16  Yes [place], DO    Family History  Problem Relation Age of Onset  . Arthritis Mother   . Cancer Mother   . Arthritis Father   . Diabetes Father   . Stroke Father   . Cancer Father   . Cancer Brother   . Cancer Sister      Social History  Substance Use Topics  .  Smoking status: Former Smoker    Packs/day: 2.00    Years: 40.00    Types: Cigarettes    Quit date: 12/02/2015  . Smokeless tobacco: Never Used  . Alcohol use No    Allergies as of 05/01/2016  . (No Known Allergies)    Review of Systems:    All systems reviewed and negative except where noted in HPI.   Physical Exam:  Vital signs in last 24 hours: Temp:  [97.7 F (36.5 C)-98.2 F (36.8 C)] 97.8 F (36.6 C) (10/31 1521) Pulse Rate:  [79-98] 87 (10/31 1521) Resp:  [18-25] 20 (10/31 1521) BP: (131-159)/(68-99) 148/78 (10/31 1606) SpO2:  [90 %-97 %] 94 % (10/31 1521) Weight:  [339 lb 12.8 oz (154.1 kg)] 339 lb 12.8 oz (154.1 kg) (10/30 2226) Last BM Date: 05/01/16 (pt states normal stool no blood evident) General:   Pleasant, cooperative in  NAD Head:  Normocephalic and atraumatic. Eyes:   No icterus.   Conjunctiva pink. PERRLA. Ears:  Normal auditory acuity. Neck:  Supple; no masses or thyroidomegaly Lungs: Respirations even and unlabored. Lungs clear to auscultation bilaterally.   No wheezes, crackles, or rhonchi.  Heart:  Regular rate and rhythm;  Without murmur, clicks, rubs or gallops Abdomen:  Soft, nondistended, nontender. Normal bowel sounds. No appreciable masses or hepatomegaly.  No rebound or guarding.  Rectal:  Not performed. Msk:  Symmetrical without gross deformities.  Strength -normal Extremities:  Without edema, cyanosis or clubbing. Neurologic:  Alert and oriented x3;  grossly normal neurologically. Skin:  Intact without significant lesions or rashes. Cervical Nodes:  No significant cervical adenopathy. Psych:  Alert and cooperative. Normal affect.  LAB RESULTS:  Recent Labs  05/01/16 1230  05/02/16 0313 05/02/16 0450 05/02/16 1610  WBC 14.2*  --  15.5*  --   --   HGB 15.7  < > 14.6 14.4 14.9  HCT 45.8  --  43.4  --   --   PLT 224  --  212  --   --   < > = values in this interval not displayed. BMET  Recent Labs  05/01/16 1230 05/02/16 0313  NA 133* 137  K 4.3 4.0  CL 99* 105  CO2 25 25  GLUCOSE 152* 130*  BUN 25* 24*  CREATININE 1.36* 1.01  CALCIUM 9.7 8.8*   LFT  Recent Labs  05/01/16 1230  PROT 8.3*  ALBUMIN 4.4  AST 35  ALT 26  ALKPHOS 47  BILITOT 0.6   PT/INR  Recent Labs  05/01/16 1230  LABPROT 14.4  INR 1.11    STUDIES: Dg Chest 2 View  Result Date: 05/01/2016 CLINICAL DATA:  Right flank pain beginning at 2:30 a.m. today. No history of kidney stones or urinary symptoms. The patient reports mild chest pain. The patient was found to have an ascending thoracic aortic aneurysm on screening chest CT scan earlier this year. EXAM: CHEST  2 VIEW COMPARISON:  Chest CT scan of March 14, 2016 FINDINGS: The lungs are adequately inflated. The interstitial markings are  mildly increased chronically. There is no alveolar infiltrate. There is no pleural effusion. The heart is top-normal in size but stable. The pulmonary vascularity is normal. There is tortuosity of the ascending and descending thoracic aorta. The patient has history previous aortic valve replacement. The observed bony thorax exhibits no acute abnormality. IMPRESSION: Mild stable cardiomegaly with stable interstitial prominence. No acute cardiopulmonary abnormality. Electronically Signed   By: David  Martinique M.D.   On: 05/01/2016 14:54  Ct Renal Stone Study  Result Date: 05/01/2016 CLINICAL DATA:  Right flank pain for several hours EXAM: CT ABDOMEN AND PELVIS WITHOUT CONTRAST TECHNIQUE: Multidetector CT imaging of the abdomen and pelvis was performed following the standard protocol without IV contrast. COMPARISON:  None. FINDINGS: Lower chest: No acute abnormality. Changes consistent with prior valve replacement are noted. Hepatobiliary: Diffuse fatty infiltration of the liver is seen. A few areas of relatively normal density are seen consistent with areas of focal fatty sparing. Pancreas: Unremarkable. No pancreatic ductal dilatation or surrounding inflammatory changes. Spleen: Normal in size without focal abnormality. Adrenals/Urinary Tract: Adrenal glands are unremarkable. Kidneys are normal, without renal calculi, focal lesion, or hydronephrosis. Bladder is unremarkable. Stomach/Bowel: Stomach is within normal limits. Appendix appears normal. No evidence of bowel wall thickening, distention, or inflammatory changes. Vascular/Lymphatic: Aortic atherosclerosis. No enlarged abdominal or pelvic lymph nodes. Reproductive: Prostate is unremarkable. Other: No abdominal wall hernia or abnormality. No abdominopelvic ascites. Musculoskeletal: No acute or significant osseous findings. IMPRESSION: Diffuse fatty liver No evidence of renal calculi or obstructive changes. No acute abnormality is seen Electronically Signed    By: Inez Catalina M.D.   On: 05/01/2016 15:11   Ct Angio Chest/abd/pel For Dissection W And/or Wo Contrast  Result Date: 05/01/2016 CLINICAL DATA:  60 year old male with right flank pain since 0230 hours today. Initial encounter. EXAM: CT ANGIOGRAPHY CHEST, ABDOMEN AND PELVIS TECHNIQUE: Multidetector CT imaging through the chest, abdomen and pelvis was performed using the standard protocol during bolus administration of intravenous contrast. Multiplanar reconstructed images and MIPs were obtained and reviewed to evaluate the vascular anatomy. CONTRAST:  100 mL Isovue 370 COMPARISON:  Noncontrast CT Abdomen and Pelvis 1455 hours today. Noncontrast chest CT 03/14/2016. FINDINGS: CTA CHEST FINDINGS Cardiovascular: Calcified aortic atherosclerosis. Fusiform enlargement of the proximal ascending aorta appears stable since September, 50-51 mm diameter. No thoracic aortic dissection. Negative proximal great vessels aside from tortuosity. Mild for age calcified plaque in the thoracic aorta which is otherwise negative. Prosthetic aortic valve.  Calcified coronary artery atherosclerosis. No pericardial effusion. Mediastinum/Nodes: No mediastinal lymphadenopathy. Lungs/Pleura: No pleural effusion. Major airways are patent. Lung parenchyma is stable from the low-dose chest CT on 03/14/2016 (please see that report). Musculoskeletal: Sequelae of median sternotomy. No acute osseous abnormality identified. Review of the MIP images confirms the above findings. CTA ABDOMEN AND PELVIS FINDINGS VASCULAR The abdominal aorta is patent with mild for age calcified plaque. There is mild calcified plaque at the right renal artery origin (series 6, image 110). The main right renal artery remains patent. However, the right kidney is abnormally enhancing in appearance consistent with multiple renal infarcts (series 6, image 132). There is minimal to mild Renal hilar vessel level calcified plaque. No abdominal aortic aneurysm or dissection.  Iliac artery calcified plaque which is more moderate and advanced than the plaque affecting the aorta. The bilateral iliac and proximal femoral artery is appear remain patent; contrast timing is suboptimal in the lower pelvis. Review of the MIP images confirms the above findings. NON-VASCULAR Hepatobiliary: Diffuse fatty liver disease. Mildly distended but otherwise negative gallbladder. Pancreas: Negative. Spleen: Negative. Adrenals/Urinary Tract: Negative adrenal glands. The left kidney is normal. The right kidney is abnormal with multiple peripheral and wedge-shaped areas of hypoenhancement in keeping with right renal infarcts. The midpole is relatively spared. No hydronephrosis or hydroureter.  No abdominal free fluid. Negative urinary bladder. Stomach/Bowel: Decompressed rectosigmoid colon. Mildly redundant sigmoid. Decompressed left colon. Much of the transverse colon is decompressed. The hepatic flexure is  redundant. Negative right colon and appendix. Negative terminal ileum. No dilated small bowel. Negative stomach and duodenum. Lymphatic: No lymphadenopathy. Reproductive: Negative. Other: No pelvic free fluid Musculoskeletal: Severe lower lumbar spine facet degeneration with vacuum facet. Advanced L5-S1 disc and endplate degeneration. No acute osseous abnormality identified. IMPRESSION: 1. Positive for multiple right renal infarcts. There is calcified aortic and renal artery atherosclerosis, but this seems average or somewhat below average for patient age. This patient has a prosthetic aortic valve. Perhaps he is inadequately anticoagulated. This was discussed by telephone with Dr. Eula Listen on 05/01/2016 at 17:25 . 2. Stable fusiform enlargement of the ascending aorta since the chest CT on 03/14/2016. No aortic dissection. 3. More moderate calcified atherosclerosis of the iliac and proximal femoral arteries which appear to remain patent (pelvic vascular contrast timing is suboptimal). 4.  Calcified coronary artery atherosclerosis. 5. Fatty liver disease. Electronically Signed   By: Genevie Ann M.D.   On: 05/01/2016 17:30      Impression / Plan:   Knoxton Ansley is a 60 y.o. y/o male with a H/O hematochezia and a family H/O cancer.  Plan - Hold colonoscopy and proceed with anticoagulation as per Cardiology.   Thank you for involving me in the care of this patient.      LOS: 1 day   Roberto Butts, MD  05/02/2016, 4:41 PM   Note: This dictation was prepared with Dragon dictation along with smaller phrase technology. Any transcriptional errors that result from this process are unintentional.

## 2016-05-02 NOTE — Telephone Encounter (Signed)
Spoke with Roberto [name] and she stated ER is requesting a GI consult and she only wants our office to do this. I advised her we have not yet heard anything on this. I will let her know if they contact us.

## 2016-05-03 DIAGNOSIS — K922 Gastrointestinal hemorrhage, unspecified: Secondary | ICD-10-CM

## 2016-05-03 DIAGNOSIS — M545 Low back pain: Secondary | ICD-10-CM

## 2016-05-03 DIAGNOSIS — N280 Ischemia and infarction of kidney: Secondary | ICD-10-CM

## 2016-05-03 LAB — CBC
HEMATOCRIT: 43.8 % (ref 40.0–52.0)
HEMOGLOBIN: 14.3 g/dL (ref 13.0–18.0)
MCH: 28.5 pg (ref 26.0–34.0)
MCHC: 32.5 g/dL (ref 32.0–36.0)
MCV: 87.7 fL (ref 80.0–100.0)
Platelets: 182 10*3/uL (ref 150–440)
RBC: 4.99 MIL/uL (ref 4.40–5.90)
RDW: 14.7 % — ABNORMAL HIGH (ref 11.5–14.5)
WBC: 18.6 10*3/uL — ABNORMAL HIGH (ref 3.8–10.6)

## 2016-05-03 LAB — HEPARIN LEVEL (UNFRACTIONATED)
HEPARIN UNFRACTIONATED: 0.28 [IU]/mL — AB (ref 0.30–0.70)
Heparin Unfractionated: 0.37 IU/mL (ref 0.30–0.70)
Heparin Unfractionated: 0.26 IU/mL — ABNORMAL LOW (ref 0.30–0.70)

## 2016-05-03 LAB — HEMOGLOBIN: Hemoglobin: 14.9 g/dL (ref 13.0–18.0)

## 2016-05-03 LAB — PROTIME-INR
INR: 1.11
Prothrombin Time: 14.3 seconds (ref 11.4–15.2)

## 2016-05-03 MED ORDER — LISINOPRIL 5 MG PO TABS
2.5000 mg | ORAL_TABLET | Freq: Every day | ORAL | Status: DC
  Administered 2016-05-03 – 2016-05-05 (×3): 2.5 mg via ORAL
  Filled 2016-05-03 (×3): qty 1

## 2016-05-03 MED ORDER — HEPARIN BOLUS VIA INFUSION
1600.0000 [IU] | Freq: Once | INTRAVENOUS | Status: AC
  Administered 2016-05-03: 1600 [IU] via INTRAVENOUS
  Filled 2016-05-03: qty 1600

## 2016-05-03 MED ORDER — FUROSEMIDE 40 MG PO TABS
40.0000 mg | ORAL_TABLET | Freq: Every day | ORAL | Status: DC
Start: 2016-05-03 — End: 2016-05-06
  Administered 2016-05-03 – 2016-05-06 (×4): 40 mg via ORAL
  Filled 2016-05-03 (×4): qty 1

## 2016-05-03 MED ORDER — WARFARIN SODIUM 2.5 MG PO TABS
10.0000 mg | ORAL_TABLET | Freq: Once | ORAL | Status: AC
Start: 2016-05-03 — End: 2016-05-03
  Administered 2016-05-03: 18:00:00 10 mg via ORAL
  Filled 2016-05-03: qty 4

## 2016-05-03 NOTE — Progress Notes (Signed)
ANTICOAGULATION CONSULT NOTE - Initial Consult  Pharmacy Consult for Warfarin Dosing Indication: Aortic Mechanical Valve and Renal infarction  No Known Allergies  Patient Measurements: Height: 5\' 6"  (167.6 cm) Weight: (!) 339 lb 12.8 oz (154.1 kg) IBW/kg (Calculated) : 63.8 Heparin Dosing Weight:    Vital Signs: Temp: 98.8 F (37.1 C) (11/01 0924) Temp Source: Oral (11/01 0924) BP: 152/110 (11/01 0924) Pulse Rate: 101 (11/01 0924)  Labs:  Recent Labs  05/01/16 1230 05/01/16 1930  05/02/16 0313  05/02/16 1236  05/02/16 1818 05/02/16 2352 05/03/16 0301  HGB 15.7  --   < > 14.6  < >  --   < >  --  14.9 14.3  HCT 45.8  --   --  43.4  --   --   --   --   --  43.8  PLT 224  --   --  212  --   --   --   --   --  182  APTT  --  26  --   --   --   --   --   --   --   --   LABPROT 14.4  --   --   --   --   --   --   --   --  14.3  INR 1.11  --   --   --   --   --   --   --   --  1.11  HEPARINUNFRC  --   --   --   --   < > 0.33  --  0.23*  --  0.28*  CREATININE 1.36*  --   --  1.01  --   --   --   --   --   --   < > = values in this interval not displayed.  Estimated Creatinine Clearance: 108.5 mL/min (by C-G formula based on SCr of 1.01 mg/dL).   Medical History: Past Medical History:  Diagnosis Date  . Cellulitis   . CHF (congestive heart failure) ([place])   . Diabetes mellitus without complication ([place])   . H/O mechanical aortic valve replacement   . Hypercholesterolemia   . Hypertension   . TIA (transient ischemic attack) 05/2014    Medications:  Infusions:  . heparin 2,200 Units/hr (05/03/16 [id number])    Assessment: Patient admitted for renal infarction. Patient has mechanical aortic valve and was taking Warfarin 3.75mg  (1/2 7.5mg  tablet) for 2 days then 7.5mg  for 1 day. His therapy was stopped in preparation for a colonoscopy without any bridging. Initiated on Heparin Infusion and now being restarted on Warfarin.  10/30 INR 1.11  No warfarin given 10/31: INR: none  documented; 10 mg  11/1: INR: 1.11;   Goal of Therapy:  INR 2.5 - 3.5 Monitor platelets by anticoagulation protocol: Yes   Plan:  Will give Warfarin 10mg  tonight and check INR in the morning.    Larene Beach, PharmD  05/03/2016 9:40 AM

## 2016-05-03 NOTE — Consult Note (Signed)
[facility] Vascular Consult Note  MRN : [id number]  Roberto [name] is a 60 y.o. (1955-10-31) male who presents with chief complaint of  Chief Complaint  Patient presents with  . Flank Pain  .  History of Present Illness: I am asked by Dr. Posey Pronto to see the patient regarding a renal infarct. The patient has a history of a mechanical valve placement but was having some GI bleeding issues and had his Coumadin stopped for 5 days primarily to have colonoscopy. On the fourth day, he began having severe right flank pain. 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 He reports that his pain is better after the initiation of heparin drip yesterday. He reports no previous history of renal insufficiency to his knowledge. The pain is a dull aching pain in his right flank and back area. His creatinine bumped on admission as well. He does not have fever or chills.  Current Facility-Administered Medications  Medication Dose Route Frequency Provider Last Rate Last Dose  . acetaminophen (TYLENOL) tablet 650 mg  650 mg Oral Q6H PRN Loletha Grayer, MD       Or  . acetaminophen (TYLENOL) suppository 650 mg  650 mg Rectal Q6H PRN Loletha Grayer, MD      . albuterol (PROVENTIL) (2.5 MG/3ML) 0.083% nebulizer solution 2.5 mg  2.5 mg Nebulization Q6H PRN Loletha Grayer, MD      . cyclobenzaprine (FLEXERIL) tablet 10 mg  10 mg Oral BID PRN Loletha Grayer, MD      . furosemide (LASIX) tablet 40 mg  40 mg Oral Daily Fritzi Mandes, MD   40 mg at 05/03/16 0901  . heparin ADULT infusion 100 units/mL (25000 units/284mL sodium chloride 0.45%)  2,200 Units/hr Intravenous Continuous Fritzi Mandes, MD 22 mL/hr at 05/03/16 0904 2,200 Units/hr at 05/03/16 0904  . hydrocerin (EUCERIN) cream   Topical BID Loletha Grayer, MD      . HYDROmorphone (DILAUDID) injection 2 mg  2 mg Intravenous Q3H PRN Fritzi Mandes, MD   2 mg at 05/03/16 0907  . lisinopril (PRINIVIL,ZESTRIL) tablet 2.5 mg  2.5 mg Oral QHS Fritzi Mandes, MD       . MEDLINE mouth rinse  15 mL Mouth Rinse BID Fritzi Mandes, MD   15 mL at 05/03/16 0904  . metoprolol tartrate (LOPRESSOR) tablet 25 mg  25 mg Oral BID Loletha Grayer, MD   25 mg at 05/03/16 0901  . mometasone-formoterol (DULERA) 200-5 MCG/ACT inhaler 2 puff  2 puff Inhalation BID Loletha Grayer, MD   2 puff at 05/03/16 0902  . oxyCODONE (Oxy IR/ROXICODONE) immediate release tablet 10 mg  10 mg Oral Q4H PRN Fritzi Mandes, MD   10 mg at 05/03/16 1253  . pneumococcal 23 valent vaccine (PNU-IMMUNE) injection 0.5 mL  0.5 mL Intramuscular Tomorrow-1000 Alexis Hugelmeyer, DO      . simvastatin (ZOCOR) tablet 40 mg  40 mg Oral q1800 Loletha Grayer, MD   40 mg at 05/02/16 1756  . tiotropium (SPIRIVA) inhalation capsule 18 mcg  18 mcg Inhalation Daily Loletha Grayer, MD      . warfarin (COUMADIN) tablet 10 mg  10 mg Oral ONCE-1800 Fritzi Mandes, MD      . Warfarin - Pharmacist Dosing Inpatient   Does not apply q1800 Vira Blanco, [facility]        Past Medical History:  Diagnosis Date  . Cellulitis   . CHF (congestive heart failure) ([place])   . Diabetes mellitus without complication ([place])   .  H/O mechanical aortic valve replacement   . Hypercholesterolemia   . Hypertension   . TIA (transient ischemic attack) 05/2014    Past Surgical History:  Procedure Laterality Date  . AORTIC VALVE REPLACEMENT    . CARDIAC SURGERY  2009   CHF  . CARPAL TUNNEL RELEASE Left 2005  . TONSILLECTOMY  1962    Social History Social History  Substance Use Topics  . Smoking status: Former Smoker    Packs/day: 2.00    Years: 40.00    Types: Cigarettes    Quit date: 12/02/2015  . Smokeless tobacco: Never Used  . Alcohol use No    Family History Family History  Problem Relation Age of Onset  . Arthritis Mother   . Cancer Mother   . Arthritis Father   . Diabetes Father   . Stroke Father   . Cancer Father   . Cancer Brother   . Cancer Sister     No Known Allergies   REVIEW OF SYSTEMS (Negative unless  checked)  Constitutional: [] Weight loss  [] Fever  [] Chills Cardiac: [] Chest pain   [] Chest pressure   [x] Palpitations   [] Shortness of breath when laying flat   [] Shortness of breath at rest   [] Shortness of breath with exertion. Vascular:  [] Pain in legs with walking   [] Pain in legs at rest   [] Pain in legs when laying flat   [] Claudication   [] Pain in feet when walking  [] Pain in feet at rest  [] Pain in feet when laying flat   [] History of DVT   [] Phlebitis   [] Swelling in legs   [] Varicose veins   [] Non-healing ulcers Pulmonary:   [] Uses home oxygen   [] Productive cough   [] Hemoptysis   [] Wheeze  [] COPD   [] Asthma Neurologic:  [] Dizziness  [] Blackouts   [] Seizures   [] History of stroke   [] History of TIA  [] Aphasia   [] Temporary blindness   [] Dysphagia   [] Weakness or numbness in arms   [] Weakness or numbness in legs Musculoskeletal:  [] Arthritis   [] Joint swelling   [] Joint pain   [] Low back pain Hematologic:  [] Easy bruising  [] Easy bleeding   [] Hypercoagulable state   [] Anemic  [] Hepatitis Gastrointestinal:  [x] Blood in stool   [] Vomiting blood  [] Gastroesophageal reflux/heartburn   [] Difficulty swallowing. Genitourinary:  [] Chronic kidney disease   [] Difficult urination  [] Frequent urination  [] Burning with urination   [x] Blood in urine Skin:  [] Rashes   [] Ulcers   [] Wounds Psychological:  [] History of anxiety   []  History of major depression.  Physical Examination  Vitals:   05/02/16 2056 05/03/16 0415 05/03/16 0924 05/03/16 1408  BP: (!) 128/92 137/79 (!) 152/110 (!) 163/83  Pulse: 91 77 (!) 101 82  Resp: (!) 22 20 20 20   Temp: 98.3 F (36.8 C) 98.9 F (37.2 C) 98.8 F (37.1 C) 99.6 F (37.6 C)  TempSrc: Oral Oral Oral Oral  SpO2: 98% 95% 94% 96%  Weight:      Height:       Body mass index is 54.85 kg/m. Gen:  WD/WN, NAD. Morbidly obese Head: Angie/AT, No temporalis wasting. Prominent temp pulse not noted. Ear/Nose/Throat: Hearing grossly intact, nares w/o erythema or  drainage, oropharynx w/o Erythema/Exudate Eyes: Sclera non-icteric, conjunctiva clear Neck: Trachea midline.  No JVD.  Pulmonary:  Good air movement, respirations not labored, equal bilaterally.  Cardiac: RRR, normal S1, S2. Murmur present Vascular:  Vessel Right Left  Radial Palpable Palpable  Gastrointestinal: Mild to moderate right lower quadrant tenderness and right flank and lower abdominal area. No rebound or guarding. Musculoskeletal: M/S 5/5 throughout.  Extremities without ischemic changes.  No deformity or atrophy. No edema. Neurologic: Sensation grossly intact in extremities.  Symmetrical.  Speech is fluent. Motor exam as listed above. Psychiatric: Judgment intact, Mood & affect appropriate for pt's clinical situation. Dermatologic: No rashes or ulcers noted.  No cellulitis or open wounds. Lymph : No Cervical, Axillary, or Inguinal lymphadenopathy.      CBC Lab Results  Component Value Date   WBC 18.6 (H) 05/03/2016   HGB 14.3 05/03/2016   HCT 43.8 05/03/2016   MCV 87.7 05/03/2016   PLT 182 05/03/2016    BMET    Component Value Date/Time   NA 137 05/02/2016 0313   NA 140 04/19/2016 1009   NA 140 05/25/2014 0556   K 4.0 05/02/2016 0313   K 3.6 05/25/2014 0556   CL 105 05/02/2016 0313   CL 106 05/25/2014 0556   CO2 25 05/02/2016 0313   CO2 28 05/25/2014 0556   GLUCOSE 130 (H) 05/02/2016 0313   GLUCOSE 101 (H) 05/25/2014 0556   BUN 24 (H) 05/02/2016 0313   BUN 11 04/19/2016 1009   BUN 15 05/25/2014 0556   CREATININE 1.01 05/02/2016 0313   CREATININE 0.85 05/25/2014 0556   CALCIUM 8.8 (L) 05/02/2016 0313   CALCIUM 8.5 05/25/2014 0556   GFRNONAA >60 05/02/2016 0313   GFRNONAA >60 05/25/2014 0556   GFRAA >60 05/02/2016 0313   GFRAA >60 05/25/2014 0556   Estimated Creatinine Clearance: 108.5 mL/min (by C-G formula based on SCr of 1.01 mg/dL).  COAG Lab Results  Component Value Date   INR 1.11 05/03/2016   INR  1.11 05/01/2016   INR 2.7 (H) 04/19/2016    Radiology Dg Chest 2 View  Result Date: 05/01/2016 CLINICAL DATA:  Right flank pain beginning at 2:30 a.m. today. No history of kidney stones or urinary symptoms. The patient reports mild chest pain. The patient was found to have an ascending thoracic aortic aneurysm on screening chest CT scan earlier this year. EXAM: CHEST  2 VIEW COMPARISON:  Chest CT scan of March 14, 2016 FINDINGS: The lungs are adequately inflated. The interstitial markings are mildly increased chronically. There is no alveolar infiltrate. There is no pleural effusion. The heart is top-normal in size but stable. The pulmonary vascularity is normal. There is tortuosity of the ascending and descending thoracic aorta. The patient has history previous aortic valve replacement. The observed bony thorax exhibits no acute abnormality. IMPRESSION: Mild stable cardiomegaly with stable interstitial prominence. No acute cardiopulmonary abnormality. Electronically Signed   By: David  Martinique M.D.   On: 05/01/2016 14:54   Dg Lumbar Spine Complete  Result Date: 04/21/2016 CLINICAL DATA:  Lower back pain for 2 weeks without known injury. No sciatica. EXAM: LUMBAR SPINE - COMPLETE 4+ VIEW COMPARISON:  None. FINDINGS: No fracture or spondylolisthesis is noted. Moderate degenerative disc disease is noted at L5-S1 with anterior osteophyte formation. Other disc spaces are well-maintained. Posterior facet joints are unremarkable. IMPRESSION: Moderate degenerative disc disease is noted at L5-S1. No acute abnormality seen in the lumbar spine. Electronically Signed   By: Marijo Conception, M.D.   On: 04/21/2016 11:22   Ct Renal Stone Study  Result Date: 05/01/2016 CLINICAL DATA:  Right flank pain for several hours EXAM: CT ABDOMEN AND PELVIS WITHOUT CONTRAST TECHNIQUE: Multidetector CT imaging of the abdomen and pelvis was performed following the  standard protocol without IV contrast. COMPARISON:  None.  FINDINGS: Lower chest: No acute abnormality. Changes consistent with prior valve replacement are noted. Hepatobiliary: Diffuse fatty infiltration of the liver is seen. A few areas of relatively normal density are seen consistent with areas of focal fatty sparing. Pancreas: Unremarkable. No pancreatic ductal dilatation or surrounding inflammatory changes. Spleen: Normal in size without focal abnormality. Adrenals/Urinary Tract: Adrenal glands are unremarkable. Kidneys are normal, without renal calculi, focal lesion, or hydronephrosis. Bladder is unremarkable. Stomach/Bowel: Stomach is within normal limits. Appendix appears normal. No evidence of bowel wall thickening, distention, or inflammatory changes. Vascular/Lymphatic: Aortic atherosclerosis. No enlarged abdominal or pelvic lymph nodes. Reproductive: Prostate is unremarkable. Other: No abdominal wall hernia or abnormality. No abdominopelvic ascites. Musculoskeletal: No acute or significant osseous findings. IMPRESSION: Diffuse fatty liver No evidence of renal calculi or obstructive changes. No acute abnormality is seen Electronically Signed   By: Inez Catalina M.D.   On: 05/01/2016 15:11   Ct Angio Chest/abd/pel For Dissection W And/or Wo Contrast  Result Date: 05/01/2016 CLINICAL DATA:  60 year old male with right flank pain since 0230 hours today. Initial encounter. EXAM: CT ANGIOGRAPHY CHEST, ABDOMEN AND PELVIS TECHNIQUE: Multidetector CT imaging through the chest, abdomen and pelvis was performed using the standard protocol during bolus administration of intravenous contrast. Multiplanar reconstructed images and MIPs were obtained and reviewed to evaluate the vascular anatomy. CONTRAST:  100 mL Isovue 370 COMPARISON:  Noncontrast CT Abdomen and Pelvis 1455 hours today. Noncontrast chest CT 03/14/2016. FINDINGS: CTA CHEST FINDINGS Cardiovascular: Calcified aortic atherosclerosis. Fusiform enlargement of the proximal ascending aorta appears stable since  September, 50-51 mm diameter. No thoracic aortic dissection. Negative proximal great vessels aside from tortuosity. Mild for age calcified plaque in the thoracic aorta which is otherwise negative. Prosthetic aortic valve.  Calcified coronary artery atherosclerosis. No pericardial effusion. Mediastinum/Nodes: No mediastinal lymphadenopathy. Lungs/Pleura: No pleural effusion. Major airways are patent. Lung parenchyma is stable from the low-dose chest CT on 03/14/2016 (please see that report). Musculoskeletal: Sequelae of median sternotomy. No acute osseous abnormality identified. Review of the MIP images confirms the above findings. CTA ABDOMEN AND PELVIS FINDINGS VASCULAR The abdominal aorta is patent with mild for age calcified plaque. There is mild calcified plaque at the right renal artery origin (series 6, image 110). The main right renal artery remains patent. However, the right kidney is abnormally enhancing in appearance consistent with multiple renal infarcts (series 6, image 132). There is minimal to mild Renal hilar vessel level calcified plaque. No abdominal aortic aneurysm or dissection. Iliac artery calcified plaque which is more moderate and advanced than the plaque affecting the aorta. The bilateral iliac and proximal femoral artery is appear remain patent; contrast timing is suboptimal in the lower pelvis. Review of the MIP images confirms the above findings. NON-VASCULAR Hepatobiliary: Diffuse fatty liver disease. Mildly distended but otherwise negative gallbladder. Pancreas: Negative. Spleen: Negative. Adrenals/Urinary Tract: Negative adrenal glands. The left kidney is normal. The right kidney is abnormal with multiple peripheral and wedge-shaped areas of hypoenhancement in keeping with right renal infarcts. The midpole is relatively spared. No hydronephrosis or hydroureter.  No abdominal free fluid. Negative urinary bladder. Stomach/Bowel: Decompressed rectosigmoid colon. Mildly redundant sigmoid.  Decompressed left colon. Much of the transverse colon is decompressed. The hepatic flexure is redundant. Negative right colon and appendix. Negative terminal ileum. No dilated small bowel. Negative stomach and duodenum. Lymphatic: No lymphadenopathy. Reproductive: Negative. Other: No pelvic free fluid Musculoskeletal: Severe lower lumbar spine facet degeneration with vacuum  facet. Advanced L5-S1 disc and endplate degeneration. No acute osseous abnormality identified. IMPRESSION: 1. Positive for multiple right renal infarcts. There is calcified aortic and renal artery atherosclerosis, but this seems average or somewhat below average for patient age. This patient has a prosthetic aortic valve. Perhaps he is inadequately anticoagulated. This was discussed by telephone with Dr. Eula Listen on 05/01/2016 at 17:25 . 2. Stable fusiform enlargement of the ascending aorta since the chest CT on 03/14/2016. No aortic dissection. 3. More moderate calcified atherosclerosis of the iliac and proximal femoral arteries which appear to remain patent (pelvic vascular contrast timing is suboptimal). 4. Calcified coronary artery atherosclerosis. 5. Fatty liver disease. Electronically Signed   By: Genevie Ann M.D.   On: 05/01/2016 17:30      Assessment/Plan 1. Renal infarct. Continue anticoagulation. No intervention or vascular surgery of benefit at this point. 2. Stable thoracic aortic aneurysm in the ascending aorta from previous studies. 3. Prosthetic aortic valve. Needs to continue anticoagulation.   Leotis Pain, MD  05/03/2016 2:28 PM    This note was created with Dragon medical transcription system.  Any error is purely unintentional

## 2016-05-03 NOTE — Progress Notes (Addendum)
ANTICOAGULATION CONSULT NOTE -FOLLOW UP   Pharmacy Consult for Heparin drip Indication: renal infarct, mechanical valve  No Known Allergies  Patient Measurements: Height: 5\' 6"  (167.6 cm) Weight: (!) 339 lb 12.8 oz (154.1 kg) IBW/kg (Calculated) : 63.8 Heparin Dosing Weight: 103.5 kg  Vital Signs: Temp: 99.6 F (37.6 C) (11/01 1408) Temp Source: Oral (11/01 1408) BP: 163/83 (11/01 1408) Pulse Rate: 82 (11/01 1408)  Labs:  Recent Labs  05/01/16 1230 05/01/16 1930  05/02/16 0313  05/02/16 2352 05/03/16 0301 05/03/16 0957 05/03/16 1453  HGB 15.7  --   < > 14.6  < > 14.9 14.3  --   --   HCT 45.8  --   --  43.4  --   --  43.8  --   --   PLT 224  --   --  212  --   --  182  --   --   APTT  --  26  --   --   --   --   --   --   --   LABPROT 14.4  --   --   --   --   --  14.3  --   --   INR 1.11  --   --   --   --   --  1.11  --   --   HEPARINUNFRC  --   --   --   --   < >  --  0.28* 0.37 0.26*  CREATININE 1.36*  --   --  1.01  --   --   --   --   --   < > = values in this interval not displayed.  Estimated Creatinine Clearance: 108.5 mL/min (by C-G formula based on SCr of 1.01 mg/dL).   Medical History: Past Medical History:  Diagnosis Date  . Cellulitis   . CHF (congestive heart failure) ([place])   . Diabetes mellitus without complication ([place])   . H/O mechanical aortic valve replacement   . Hypercholesterolemia   . Hypertension   . TIA (transient ischemic attack) 05/2014      Assessment: 60 yo male usually on warfarin for mechanical aortic valve but being held for colonoscopy now here with renal infarct. Per ED MD note possible GI bleed? Spoke with Dr. Leslye Peer who states may be hemorrhoids, pt with brown stool; ok to bolus heparin and provide usual dosing. Hgb 15.7, plt 224, INR 1.11.  Per lab cannot add on aPTT at this time, asked for stat draw   Goal of Therapy:  Heparin level 0.3-0.7 units/ml Monitor platelets by anticoagulation protocol: Yes   Plan:   Heparin bolus 4000 units IV x1 then heparin drip at 1400 units/hr (=14 ml/hr) Heparin level in 6h CBC in AM  10/31 AM heparin level 0.11. 3100 unit bolus and increase rate to 1800 units/hr. Recheck in 6 hours.  10/31 1230 Heparin level 0.33, will continue with current rate and check confirmation level in 6 hr.  11/1 03:00 heparin level 0.28. 1600 unit bolus an increase rate to 2200 units/hr. Recheck heparin level in 6 hours.  11/1 Heparin level @ 0957= 0.37. Will continue current heparin gtt rate. Will rechekc Heparin level @ 1500. Pharmacy to follow per consult.  11/1 Heparin level @ 1453=0.26 Will bolus 1600 units and increase rate to 2400 units/hr. Recheck HL in 6 hours  11/1 23:00 heparin level 0.25. 1600 unit bolus and increase to 2600 units/hr. Recheck in 6 hours.  Pharmacy will continue to follow.   Ramond Dial, Pharm.D Clinical Pharmacist

## 2016-05-03 NOTE — Progress Notes (Signed)
[facility] [place] NAME: Roberto [name]    MR#:  [id number]  DATE OF BIRTH:  Oct 02, 1955  SUBJECTIVE:  Came in with back pain severe,right. Found to have renal infarct Pain some better  REVIEW OF SYSTEMS:   Review of Systems  Constitutional: Negative for chills, fever and weight loss.  HENT: Negative for ear discharge, ear pain and nosebleeds.   Eyes: Negative for blurred vision, pain and discharge.  Respiratory: Negative for sputum production, shortness of breath, wheezing and stridor.   Cardiovascular: Negative for chest pain, palpitations, orthopnea and PND.  Gastrointestinal: Negative for abdominal pain, diarrhea, nausea and vomiting.  Genitourinary: Negative for frequency and urgency.  Musculoskeletal: Positive for back pain. Negative for joint pain.  Neurological: Negative for sensory change, speech change, focal weakness and weakness.  Psychiatric/Behavioral: Negative for depression and hallucinations. The patient is not nervous/anxious.    Tolerating Diet:yes Tolerating PT: ambulatory  DRUG ALLERGIES:  No Known Allergies  VITALS:  Blood pressure 137/79, pulse 77, temperature 98.9 F (37.2 C), temperature source Oral, resp. rate 20, height 5\' 6"  (1.676 m), weight (!) 154.1 kg (339 lb 12.8 oz), SpO2 95 %.  PHYSICAL EXAMINATION:   Physical Exam  GENERAL:  60-year-old patient lying in the bed with no acute distress.  EYES: Pupils equal, round, reactive to light and accommodation. No scleral icterus. Extraocular muscles intact.  HEENT: Head atraumatic, normocephalic. Oropharynx and nasopharynx clear.  NECK:  Supple, no jugular venous distention. No thyroid enlargement, no tenderness.  LUNGS: Normal breath sounds bilaterally, no wheezing, rales, rhonchi. No use of accessory muscles of respiration.  CARDIOVASCULAR: S1, S2 normal. No murmurs, rubs, or gallops.  ABDOMEN: Soft, nontender, nondistended. Bowel sounds  present. No organomegaly or mass.  EXTREMITIES: No cyanosis, clubbing or edema b/l.    NEUROLOGIC: Cranial nerves II through XII are intact. No focal Motor or sensory deficits b/l.   PSYCHIATRIC:  patient is alert and oriented x 3.  SKIN: No obvious rash, lesion, or ulcer.   LABORATORY PANEL:  CBC  Recent Labs Lab 05/03/16 0301  WBC 18.6*  HGB 14.3  HCT 43.8  PLT 182    Chemistries   Recent Labs Lab 05/01/16 1230 05/02/16 0313  NA 133* 137  K 4.3 4.0  CL 99* 105  CO2 25 25  GLUCOSE 152* 130*  BUN 25* 24*  CREATININE 1.36* 1.01  CALCIUM 9.7 8.8*  AST 35  --   ALT 26  --   ALKPHOS 47  --   BILITOT 0.6  --    Cardiac Enzymes No results for input(s): TROPONINI in the last 168 hours. RADIOLOGY:  Dg Chest 2 View  Result Date: 05/01/2016 CLINICAL DATA:  Right flank pain beginning at 2:30 a.m. today. No history of kidney stones or urinary symptoms. The patient reports mild chest pain. The patient was found to have an ascending thoracic aortic aneurysm on screening chest CT scan earlier this year. EXAM: CHEST  2 VIEW COMPARISON:  Chest CT scan of March 14, 2016 FINDINGS: The lungs are adequately inflated. The interstitial markings are mildly increased chronically. There is no alveolar infiltrate. There is no pleural effusion. The heart is top-normal in size but stable. The pulmonary vascularity is normal. There is tortuosity of the ascending and descending thoracic aorta. The patient has history previous aortic valve replacement. The observed bony thorax exhibits no acute abnormality. IMPRESSION: Mild stable cardiomegaly with stable interstitial prominence. No acute cardiopulmonary abnormality. Electronically  Signed   By: David  Martinique M.D.   On: 05/01/2016 14:54   Ct Renal Stone Study  Result Date: 05/01/2016 CLINICAL DATA:  Right flank pain for several hours EXAM: CT ABDOMEN AND PELVIS WITHOUT CONTRAST TECHNIQUE: Multidetector CT imaging of the abdomen and pelvis was  performed following the standard protocol without IV contrast. COMPARISON:  None. FINDINGS: Lower chest: No acute abnormality. Changes consistent with prior valve replacement are noted. Hepatobiliary: Diffuse fatty infiltration of the liver is seen. A few areas of relatively normal density are seen consistent with areas of focal fatty sparing. Pancreas: Unremarkable. No pancreatic ductal dilatation or surrounding inflammatory changes. Spleen: Normal in size without focal abnormality. Adrenals/Urinary Tract: Adrenal glands are unremarkable. Kidneys are normal, without renal calculi, focal lesion, or hydronephrosis. Bladder is unremarkable. Stomach/Bowel: Stomach is within normal limits. Appendix appears normal. No evidence of bowel wall thickening, distention, or inflammatory changes. Vascular/Lymphatic: Aortic atherosclerosis. No enlarged abdominal or pelvic lymph nodes. Reproductive: Prostate is unremarkable. Other: No abdominal wall hernia or abnormality. No abdominopelvic ascites. Musculoskeletal: No acute or significant osseous findings. IMPRESSION: Diffuse fatty liver No evidence of renal calculi or obstructive changes. No acute abnormality is seen Electronically Signed   By: Inez Catalina M.D.   On: 05/01/2016 15:11   Ct Angio Chest/abd/pel For Dissection W And/or Wo Contrast  Result Date: 05/01/2016 CLINICAL DATA:  60-year old male with right flank pain since 0230 hours today. Initial encounter. EXAM: CT ANGIOGRAPHY CHEST, ABDOMEN AND PELVIS TECHNIQUE: Multidetector CT imaging through the chest, abdomen and pelvis was performed using the standard protocol during bolus administration of intravenous contrast. Multiplanar reconstructed images and MIPs were obtained and reviewed to evaluate the vascular anatomy. CONTRAST:  100 mL Isovue 370 COMPARISON:  Noncontrast CT Abdomen and Pelvis 1455 hours today. Noncontrast chest CT 03/14/2016. FINDINGS: CTA CHEST FINDINGS Cardiovascular: Calcified aortic  atherosclerosis. Fusiform enlargement of the proximal ascending aorta appears stable since September, 50-51 mm diameter. No thoracic aortic dissection. Negative proximal great vessels aside from tortuosity. Mild for age calcified plaque in the thoracic aorta which is otherwise negative. Prosthetic aortic valve.  Calcified coronary artery atherosclerosis. No pericardial effusion. Mediastinum/Nodes: No mediastinal lymphadenopathy. Lungs/Pleura: No pleural effusion. Major airways are patent. Lung parenchyma is stable from the low-dose chest CT on 03/14/2016 (please see that report). Musculoskeletal: Sequelae of median sternotomy. No acute osseous abnormality identified. Review of the MIP images confirms the above findings. CTA ABDOMEN AND PELVIS FINDINGS VASCULAR The abdominal aorta is patent with mild for age calcified plaque. There is mild calcified plaque at the right renal artery origin (series 6, image 110). The main right renal artery remains patent. However, the right kidney is abnormally enhancing in appearance consistent with multiple renal infarcts (series 6, image 132). There is minimal to mild Renal hilar vessel level calcified plaque. No abdominal aortic aneurysm or dissection. Iliac artery calcified plaque which is more moderate and advanced than the plaque affecting the aorta. The bilateral iliac and proximal femoral artery is appear remain patent; contrast timing is suboptimal in the lower pelvis. Review of the MIP images confirms the above findings. NON-VASCULAR Hepatobiliary: Diffuse fatty liver disease. Mildly distended but otherwise negative gallbladder. Pancreas: Negative. Spleen: Negative. Adrenals/Urinary Tract: Negative adrenal glands. The left kidney is normal. The right kidney is abnormal with multiple peripheral and wedge-shaped areas of hypoenhancement in keeping with right renal infarcts. The midpole is relatively spared. No hydronephrosis or hydroureter.  No abdominal free fluid. Negative  urinary bladder. Stomach/Bowel: Decompressed rectosigmoid colon. Mildly  redundant sigmoid. Decompressed left colon. Much of the transverse colon is decompressed. The hepatic flexure is redundant. Negative right colon and appendix. Negative terminal ileum. No dilated small bowel. Negative stomach and duodenum. Lymphatic: No lymphadenopathy. Reproductive: Negative. Other: No pelvic free fluid Musculoskeletal: Severe lower lumbar spine facet degeneration with vacuum facet. Advanced L5-S1 disc and endplate degeneration. No acute osseous abnormality identified. IMPRESSION: 1. Positive for multiple right renal infarcts. There is calcified aortic and renal artery atherosclerosis, but this seems average or somewhat below average for patient age. This patient has a prosthetic aortic valve. Perhaps he is inadequately anticoagulated. This was discussed by telephone with Dr. Eula Listen on 05/01/2016 at 17:25 . 2. Stable fusiform enlargement of the ascending aorta since the chest CT on 03/14/2016. No aortic dissection. 3. More moderate calcified atherosclerosis of the iliac and proximal femoral arteries which appear to remain patent (pelvic vascular contrast timing is suboptimal). 4. Calcified coronary artery atherosclerosis. 5. Fatty liver disease. Electronically Signed   By: Genevie Ann M.D.   On: 05/01/2016 17:30   ASSESSMENT AND PLAN:   Roberto [name]  is a 60 y.o. male with a known history Of an aortic mechanical valve on Coumadin for anticoagulation. His Coumadin was on hold for the last 5 days because he was scheduled to have a colonoscopy tomorrow. Colonoscopy scheduled because he sees bright red blood when he wipes. He was on no bridging anticoagulation. He woke up this morning at 2:30 AM with severe pain in his right flank and back. Pain 10 out of 10 in intensity  1. Acute right renal infarcts due to stopping coumadin w/o any bridging with IV heparin (for colonoscopy) - INR today is 1.11. -continue IV  heparin drip  -will resume warfarin -spoke with GI dr Allen Norris. No colonoscopy at this point -d/w Dr dew recommends cont heparin and coumadin  2. Rectal bleeding. Hemoglobin stable since May.  -no procedure since no active bleeding hgb stable -on heparin and coumadin  3. History of TIA. Hold aspirin for right now  4. Type 2 diabetes mellitus without complication. Sliding scale monitor.  5. Essential hypertension continue metoprolol, lisinopril and lasix  Case discussed with Care Management/Social Worker. Management plans discussed with the patient, family and they are in agreement.  CODE STATUS: full  DVT Prophylaxis: IV heparin gtt and coumadin  TOTAL TIME TAKING CARE OF THIS PATIENT: 30 minutes.  >50% time spent on counselling and coordination of care pt, Drs Lucky Cowboy and [facility] D/C IN 1-2 DAYS, DEPENDING ON CLINICAL CONDITION.  Note: This dictation was prepared with Dragon dictation along with smaller phrase technology. Any transcriptional errors that result from this process are unintentional.  Kenyotta Dorfman M.D on 05/03/2016 at 8:04 AM  Between 7am to 6pm - Pager - [phone]  After 6pm go to [email or web address] - password [email or web address] [facility]  Office  [phone]  CC: Primary care physician; Park Liter, DO

## 2016-05-03 NOTE — Progress Notes (Signed)
ANTICOAGULATION CONSULT NOTE -FOLLOW UP   Pharmacy Consult for Heparin drip Indication: renal infarct, mechanical valve  No Known Allergies  Patient Measurements: Height: 5\' 6"  (167.6 cm) Weight: (!) 339 lb 12.8 oz (154.1 kg) IBW/kg (Calculated) : 63.8 Heparin Dosing Weight: 103.5 kg  Vital Signs: Temp: 98.8 F (37.1 C) (11/01 0924) Temp Source: Oral (11/01 0924) BP: 152/110 (11/01 0924) Pulse Rate: 101 (11/01 0924)  Labs:  Recent Labs  05/01/16 1230 05/01/16 1930  05/02/16 0313  05/02/16 1818 05/02/16 2352 05/03/16 0301 05/03/16 0957  HGB 15.7  --   < > 14.6  < >  --  14.9 14.3  --   HCT 45.8  --   --  43.4  --   --   --  43.8  --   PLT 224  --   --  212  --   --   --  182  --   APTT  --  26  --   --   --   --   --   --   --   LABPROT 14.4  --   --   --   --   --   --  14.3  --   INR 1.11  --   --   --   --   --   --  1.11  --   HEPARINUNFRC  --   --   --   --   < > 0.23*  --  0.28* 0.37  CREATININE 1.36*  --   --  1.01  --   --   --   --   --   < > = values in this interval not displayed.  Estimated Creatinine Clearance: 108.5 mL/min (by C-G formula based on SCr of 1.01 mg/dL).   Medical History: Past Medical History:  Diagnosis Date  . Cellulitis   . CHF (congestive heart failure) ([place])   . Diabetes mellitus without complication ([place])   . H/O mechanical aortic valve replacement   . Hypercholesterolemia   . Hypertension   . TIA (transient ischemic attack) 05/2014      Assessment: 60 yo male usually on warfarin for mechanical aortic valve but being held for colonoscopy now here with renal infarct. Per ED MD note possible GI bleed? Spoke with Dr. Leslye Peer who states may be hemorrhoids, pt with brown stool; ok to bolus heparin and provide usual dosing. Hgb 15.7, plt 224, INR 1.11.  Per lab cannot add on aPTT at this time, asked for stat draw   Goal of Therapy:  Heparin level 0.3-0.7 units/ml Monitor platelets by anticoagulation protocol: Yes   Plan:   Heparin bolus 4000 units IV x1 then heparin drip at 1400 units/hr (=14 ml/hr) Heparin level in 6h CBC in AM  10/31 AM heparin level 0.11. 3100 unit bolus and increase rate to 1800 units/hr. Recheck in 6 hours.  10/31 1230 Heparin level 0.33, will continue with current rate and check confirmation level in 6 hr.  11/1 03:00 heparin level 0.28. 1600 unit bolus an increase rate to 2200 units/hr. Recheck heparin level in 6 hours.  11/1 Heparin level @ 0957= 0.37. Will continue current heparin gtt rate. Will rechekc Heparin level @ 1500. Pharmacy to follow per consult.  Pharmacy will continue to follow.   Larene Beach, PharmD

## 2016-05-03 NOTE — Progress Notes (Signed)
ANTICOAGULATION CONSULT NOTE - Initial Consult  Pharmacy Consult for Heparin drip Indication: renal infarct, mechanical valve  No Known Allergies  Patient Measurements: Height: 5\' 6"  (167.6 cm) Weight: (!) 339 lb 12.8 oz (154.1 kg) IBW/kg (Calculated) : 63.8 Heparin Dosing Weight: 103.5 kg  Vital Signs: Temp: 98.3 F (36.8 C) (10/31 2056) Temp Source: Oral (10/31 2056) BP: 128/92 (10/31 2056) Pulse Rate: 91 (10/31 2056)  Labs:  Recent Labs  05/01/16 1230 05/01/16 1930  05/02/16 0313  05/02/16 1236  05/02/16 1818 05/02/16 2352 05/03/16 0301  HGB 15.7  --   < > 14.6  < >  --   < >  --  14.9 14.3  HCT 45.8  --   --  43.4  --   --   --   --   --  43.8  PLT 224  --   --  212  --   --   --   --   --  182  APTT  --  26  --   --   --   --   --   --   --   --   LABPROT 14.4  --   --   --   --   --   --   --   --  14.3  INR 1.11  --   --   --   --   --   --   --   --  1.11  HEPARINUNFRC  --   --   --   --   < > 0.33  --  0.23*  --  0.28*  CREATININE 1.36*  --   --  1.01  --   --   --   --   --   --   < > = values in this interval not displayed.  Estimated Creatinine Clearance: 108.5 mL/min (by C-G formula based on SCr of 1.01 mg/dL).   Medical History: Past Medical History:  Diagnosis Date  . Cellulitis   . CHF (congestive heart failure) ([place])   . Diabetes mellitus without complication ([place])   . H/O mechanical aortic valve replacement   . Hypercholesterolemia   . Hypertension   . TIA (transient ischemic attack) 05/2014      Assessment: 60 yo male usually on warfarin for mechanical aortic valve but being held for colonoscopy now here with renal infarct. Per ED MD note possible GI bleed? Spoke with Dr. Leslye Peer who states may be hemorrhoids, pt with brown stool; ok to bolus heparin and provide usual dosing. Hgb 15.7, plt 224, INR 1.11.  Per lab cannot add on aPTT at this time, asked for stat draw   Goal of Therapy:  Heparin level 0.3-0.7 units/ml Monitor platelets  by anticoagulation protocol: Yes   Plan:  Heparin bolus 4000 units IV x1 then heparin drip at 1400 units/hr (=14 ml/hr) Heparin level in 6h CBC in AM  10/31 AM heparin level 0.11. 3100 unit bolus and increase rate to 1800 units/hr. Recheck in 6 hours.  10/31 1230 Heparin level 0.33, will continue with current rate and check confirmation level in 6 hr.  11/1 03:00 heparin level 0.28. 1600 unit bolus an increase rate to 2200 units/hr. Recheck heparin level in 6 hours.  Pharmacy will continue to follow.   Paulina Fusi, PharmD, BCPS 05/03/2016 3:54 AM

## 2016-05-03 NOTE — Care Management Important Message (Signed)
Important Message  Patient Details  Name: Cabe Moskwa MRN: [id number] Date of Birth: 1955/11/04   Medicare Important Message Given:  Yes    Shelbie Ammons, RN 05/03/2016, 11:08 AM

## 2016-05-04 DIAGNOSIS — K922 Gastrointestinal hemorrhage, unspecified: Secondary | ICD-10-CM

## 2016-05-04 LAB — HEPARIN LEVEL (UNFRACTIONATED)
HEPARIN UNFRACTIONATED: 0.29 [IU]/mL — AB (ref 0.30–0.70)
HEPARIN UNFRACTIONATED: 0.28 [IU]/mL — AB (ref 0.30–0.70)
Heparin Unfractionated: 0.25 IU/mL — ABNORMAL LOW (ref 0.30–0.70)

## 2016-05-04 LAB — CBC
HEMATOCRIT: 41 % (ref 40.0–52.0)
HEMOGLOBIN: 13.8 g/dL (ref 13.0–18.0)
MCH: 29 pg (ref 26.0–34.0)
MCHC: 33.6 g/dL (ref 32.0–36.0)
MCV: 86.2 fL (ref 80.0–100.0)
Platelets: 165 10*3/uL (ref 150–440)
RBC: 4.76 MIL/uL (ref 4.40–5.90)
RDW: 14.6 % — ABNORMAL HIGH (ref 11.5–14.5)
WBC: 14.5 10*3/uL — AB (ref 3.8–10.6)

## 2016-05-04 LAB — PROTIME-INR
INR: 1.54
Prothrombin Time: 18.6 seconds — ABNORMAL HIGH (ref 11.4–15.2)

## 2016-05-04 MED ORDER — WARFARIN SODIUM 2.5 MG PO TABS
7.5000 mg | ORAL_TABLET | Freq: Once | ORAL | Status: AC
  Administered 2016-05-04: 16:00:00 7.5 mg via ORAL
  Filled 2016-05-04: qty 3

## 2016-05-04 MED ORDER — HEPARIN BOLUS VIA INFUSION
1600.0000 [IU] | Freq: Once | INTRAVENOUS | Status: AC
  Administered 2016-05-04: 1600 [IU] via INTRAVENOUS
  Filled 2016-05-04: qty 1600

## 2016-05-04 MED ORDER — HEPARIN BOLUS VIA INFUSION
1600.0000 [IU] | Freq: Once | INTRAVENOUS | Status: AC
  Administered 2016-05-04: 01:00:00 1600 [IU] via INTRAVENOUS
  Filled 2016-05-04: qty 1600

## 2016-05-04 NOTE — Progress Notes (Signed)
ANTICOAGULATION CONSULT NOTE -FOLLOW UP   Pharmacy Consult for Warfarin Dosing Indication: Aortic Mechanical Valve and Renal infarction  No Known Allergies  Patient Measurements: Height: 5\' 6"  (167.6 cm) Weight: (!) 339 lb 12.8 oz (154.1 kg) IBW/kg (Calculated) : 63.8 Heparin Dosing Weight:    Vital Signs: Temp: 98.3 F (36.8 C) (11/02 0608) Temp Source: Oral (11/02 0608) BP: 123/98 (11/02 0608) Pulse Rate: 87 (11/02 0608)  Labs:  Recent Labs  05/01/16 1230 05/01/16 1930  05/02/16 0313  05/03/16 0301  05/03/16 1453 05/03/16 2256 05/04/16 0736  HGB 15.7  --   < > 14.6  < > 14.3  --   --   --  13.8  HCT 45.8  --   --  43.4  --  43.8  --   --   --  41.0  PLT 224  --   --  212  --  182  --   --   --  165  APTT  --  26  --   --   --   --   --   --   --   --   LABPROT 14.4  --   --   --   --  14.3  --   --   --  18.6*  INR 1.11  --   --   --   --  1.11  --   --   --  1.54  HEPARINUNFRC  --   --   --   --   < > 0.28*  < > 0.26* 0.25* 0.29*  CREATININE 1.36*  --   --  1.01  --   --   --   --   --   --   < > = values in this interval not displayed.  Estimated Creatinine Clearance: 108.5 mL/min (by C-G formula based on SCr of 1.01 mg/dL).   Medical History: Past Medical History:  Diagnosis Date  . Cellulitis   . CHF (congestive heart failure) ([place])   . Diabetes mellitus without complication ([place])   . H/O mechanical aortic valve replacement   . Hypercholesterolemia   . Hypertension   . TIA (transient ischemic attack) 05/2014    Medications:  Infusions:  . heparin 2,600 Units/hr (05/04/16 0630)    Assessment: Patient admitted for renal infarction. Patient has mechanical aortic valve and was taking Warfarin 3.75mg  (1/2 7.5mg  tablet) for 2 days then 7.5mg  for 1 day. His therapy was stopped in preparation for a colonoscopy without any bridging. Initiated on Heparin Infusion and now being restarted on Warfarin.  10/30 INR 1.11  No warfarin given 10/31: INR: none  documented; 10 mg  11/1: INR: 1.11; 10 mg  11/2: INR: 1.54  Goal of Therapy:  INR 2.5 - 3.5 Monitor platelets by anticoagulation protocol: Yes   Plan:  Will give warfarin 7.5 mg PO x 1. Will recheck PT/INR w/ am    Larene Beach, PharmD  05/04/2016 10:46 AM

## 2016-05-04 NOTE — Progress Notes (Signed)
ANTICOAGULATION CONSULT NOTE -FOLLOW UP   Pharmacy Consult for Heparin drip Indication: renal infarct, mechanical valve  No Known Allergies  Patient Measurements: Height: 5\' 6"  (167.6 cm) Weight: (!) 339 lb 12.8 oz (154.1 kg) IBW/kg (Calculated) : 63.8 Heparin Dosing Weight: 103.5 kg  Vital Signs: Temp: 98.3 F (36.8 C) (11/02 0608) Temp Source: Oral (11/02 0608) BP: 123/98 (11/02 0608) Pulse Rate: 87 (11/02 0608)  Labs:  Recent Labs  05/01/16 1230 05/01/16 1930  05/02/16 0313  05/03/16 0301  05/03/16 1453 05/03/16 2256 05/04/16 0736  HGB 15.7  --   < > 14.6  < > 14.3  --   --   --  13.8  HCT 45.8  --   --  43.4  --  43.8  --   --   --  41.0  PLT 224  --   --  212  --  182  --   --   --  165  APTT  --  26  --   --   --   --   --   --   --   --   LABPROT 14.4  --   --   --   --  14.3  --   --   --  18.6*  INR 1.11  --   --   --   --  1.11  --   --   --  1.54  HEPARINUNFRC  --   --   --   --   < > 0.28*  < > 0.26* 0.25* 0.29*  CREATININE 1.36*  --   --  1.01  --   --   --   --   --   --   < > = values in this interval not displayed.  Estimated Creatinine Clearance: 108.5 mL/min (by C-G formula based on SCr of 1.01 mg/dL).   Medical History: Past Medical History:  Diagnosis Date  . Cellulitis   . CHF (congestive heart failure) ([place])   . Diabetes mellitus without complication ([place])   . H/O mechanical aortic valve replacement   . Hypercholesterolemia   . Hypertension   . TIA (transient ischemic attack) 05/2014      Assessment: 60 yo male usually on warfarin for mechanical aortic valve but being held for colonoscopy now here with renal infarct. Per ED MD note possible GI bleed? Spoke with Dr. Leslye Peer who states may be hemorrhoids, pt with brown stool; ok to bolus heparin and provide usual dosing. Hgb 15.7, plt 224, INR 1.11.  Per lab cannot add on aPTT at this time, asked for stat draw   Goal of Therapy:  Heparin level 0.3-0.7 units/ml Monitor platelets by  anticoagulation protocol: Yes   Plan:  Heparin bolus 4000 units IV x1 then heparin drip at 1400 units/hr (=14 ml/hr) Heparin level in 6h CBC in AM  10/31 AM heparin level 0.11. 3100 unit bolus and increase rate to 1800 units/hr. Recheck in 6 hours.  10/31 1230 Heparin level 0.33, will continue with current rate and check confirmation level in 6 hr.  11/1 03:00 heparin level 0.28. 1600 unit bolus an increase rate to 2200 units/hr. Recheck heparin level in 6 hours.  11/1 Heparin level @ 0957= 0.37. Will continue current heparin gtt rate. Will rechekc Heparin level @ 1500. Pharmacy to follow per consult.  11/1 Heparin level @ 1453=0.26 Will bolus 1600 units and increase rate to 2400 units/hr. Recheck HL in 6 hours  11/1 23:00 heparin  level 0.25. 1600 unit bolus and increase to 2600 units/hr. Recheck in 6 hours.  11/2: Heparin level result @ 0.29. Will give 1600 unit bolus and increase heparin gtt to 2800 units/hr. Will recheck Heparin level @ 17:00.    Pharmacy will continue to follow.   Larene Beach, PharmD  Clinical Pharmacist

## 2016-05-04 NOTE — Progress Notes (Signed)
  Jonathon Bellows MD [address], [place], [place] [address] Phone: [phone] Fax : [phone]  Roberto [name] is being followed for " red blood when he wipes"  Day 3 of follow up   Subjective:  No further bleeding , eating his breakfast . No new concerns Objective: Vital signs in last 24 hours: Vitals:   05/03/16 1408 05/03/16 2240 05/04/16 0045 05/04/16 0608  BP: (!) 163/83 (!) 141/79 (!) 144/94 (!) 123/98  Pulse: 82 (!) 106 98 87  Resp: 20 (!) 24 20 20   Temp: 99.6 F (37.6 C) 99 F (37.2 C) 98.4 F (36.9 C) 98.3 F (36.8 C)  TempSrc: Oral Oral Oral Oral  SpO2: 96% 94% 92% 93%  Weight:      Height:       Weight change:   Intake/Output Summary (Last 24 hours) at 05/04/16 0926 Last data filed at 05/03/16 1800  Gross per 24 hour  Intake              240 ml  Output                0 ml  Net              240 ml     Exam: Heart:: S1S2 present Lungs: clear to auscultation Abdomen: soft, nontender, normal bowel sounds   Lab Results: CBC Latest Ref Rng & Units 05/04/2016 05/03/2016 05/02/2016  WBC 3.8 - 10.6 K/uL 14.5(H) 18.6(H) -  Hemoglobin 13.0 - 18.0 g/dL 13.8 14.3 14.9  Hematocrit 40.0 - 52.0 % 41.0 43.8 -  Platelets 150 - 440 K/uL 165 182 -   Micro Results: No results found for this or any previous visit (from the past 240 hour(s)). Studies/Results: No results found. Medications: I have reviewed the patient's current medications. Scheduled Meds: . furosemide  40 mg Oral Daily  . hydrocerin   Topical BID  . lisinopril  2.5 mg Oral QHS  . mouth rinse  15 mL Mouth Rinse BID  . metoprolol tartrate  25 mg Oral BID  . mometasone-formoterol  2 puff Inhalation BID  . pneumococcal 23 valent vaccine  0.5 mL Intramuscular Tomorrow-1000  . simvastatin  40 mg Oral q1800  . tiotropium  18 mcg Inhalation Daily  . Warfarin - Pharmacist Dosing Inpatient   Does not apply q1800   Continuous Infusions: . heparin 2,600 Units/hr (05/04/16 0630)   PRN  Meds:.acetaminophen **OR** acetaminophen, albuterol, cyclobenzaprine, HYDROmorphone (DILAUDID) injection, oxyCODONE   Assessment: Active Problems:   Renal infarct [facility]) He was scheduled for an outpatient colonoscopy to evaluate blood when he wipes. He has mechanical heart valves and his coumadin was stopped without bridging and he presented with back pain and found to have renal infarcts . Anticoagulation has been restarted. Hb has been stable    Plan: Rectal bleeding: none presently. Continue anticoagulation , follow up with Dr Allen Norris in 6-8 weeks to decide on timing of colonoscopy with bridging anticoagulation   I will sign off.  Please call me if any further GI concerns or questions.  We would like to thank you for the opportunity to participate in the care of [facility].   LOS: 3 days   Jonathon Bellows 05/04/2016, 9:26 AM

## 2016-05-04 NOTE — Progress Notes (Signed)
[name] [facility] [place] NAME: Roberto [name]    MR#:  [id number]  DATE OF BIRTH:  18-Aug-1955  SUBJECTIVE:  Came in with back pain severe,right. Found to have renal infarct Pain improving On IV heparin gtt  REVIEW OF SYSTEMS:   Review of Systems  Constitutional: Negative for chills, fever and weight loss.  HENT: Negative for ear discharge, ear pain and nosebleeds.   Eyes: Negative for blurred vision, pain and discharge.  Respiratory: Negative for sputum production, shortness of breath, wheezing and stridor.   Cardiovascular: Negative for chest pain, palpitations, orthopnea and PND.  Gastrointestinal: Negative for abdominal pain, diarrhea, nausea and vomiting.  Genitourinary: Negative for frequency and urgency.  Musculoskeletal: Positive for back pain. Negative for joint pain.  Neurological: Negative for sensory change, speech change, focal weakness and weakness.  Psychiatric/Behavioral: Negative for depression and hallucinations. The patient is not nervous/anxious.    Tolerating Diet:yes Tolerating PT: ambulatory  DRUG ALLERGIES:  No Known Allergies  VITALS:  Blood pressure (!) 123/98, pulse 87, temperature 98.3 F (36.8 C), temperature source Oral, resp. rate 20, height 5\' 6"  (1.676 m), weight (!) 154.1 kg (339 lb 12.8 oz), SpO2 93 %.  PHYSICAL EXAMINATION:   Physical Exam  GENERAL:  60 y.o.-year-old patient lying in the bed with no acute distress.  EYES: Pupils equal, round, reactive to light and accommodation. No scleral icterus. Extraocular muscles intact.  HEENT: Head atraumatic, normocephalic. Oropharynx and nasopharynx clear.  NECK:  Supple, no jugular venous distention. No thyroid enlargement, no tenderness.  LUNGS: Normal breath sounds bilaterally, no wheezing, rales, rhonchi. No use of accessory muscles of respiration.  CARDIOVASCULAR: S1, S2 normal. No murmurs, rubs, or gallops.  ABDOMEN: Soft, nontender,  nondistended. Bowel sounds present. No organomegaly or mass.  EXTREMITIES: No cyanosis, clubbing or edema b/l.    NEUROLOGIC: Cranial nerves II through XII are intact. No focal Motor or sensory deficits b/l.   PSYCHIATRIC:  patient is alert and oriented x 3.  SKIN: No obvious rash, lesion, or ulcer.   LABORATORY PANEL:  CBC  Recent Labs Lab 05/04/16 0736  WBC 14.5*  HGB 13.8  HCT 41.0  PLT 165    Chemistries   Recent Labs Lab 05/01/16 1230 05/02/16 0313  NA 133* 137  K 4.3 4.0  CL 99* 105  CO2 25 25  GLUCOSE 152* 130*  BUN 25* 24*  CREATININE 1.36* 1.01  CALCIUM 9.7 8.8*  AST 35  --   ALT 26  --   ALKPHOS 47  --   BILITOT 0.6  --    Cardiac Enzymes No results for input(s): TROPONINI in the last 168 hours. RADIOLOGY:  No results found. ASSESSMENT AND PLAN:   Roberto [name]  is a 60 y.o. male with a known history Of an aortic mechanical valve on Coumadin for anticoagulation. His Coumadin was on hold for the last 5 days because he was scheduled to have a colonoscopy tomorrow. Colonoscopy scheduled because he sees bright red blood when he wipes. He was on no bridging anticoagulation. He woke up this morning at 2:30 AM with severe pain in his right flank and back. Pain 10 out of 10 in intensity  1. Acute right renal infarcts due to stopping coumadin w/o any bridging with IV heparin (for colonoscopy) - INR today is 1.54 -continue IV heparin drip till INR therapeutic -on warfarin -spoke with GI dr Allen Norris. No colonoscopy at this point -d/w Dr dew recommends cont heparin  and coumadin  2. Rectal bleeding. Hemoglobin stable since May.  -no procedure since no active bleeding hgb stable -pt will need bridging for his coumadin for any future procedures since he has mechanical valve  3. History of TIA. Hold aspirin for right now  4. Type 2 diabetes mellitus without complication. Sliding scale monitor.  5. Essential hypertension continue metoprolol, lisinopril and  lasix  Case discussed with Care Management/Social Worker. Management plans discussed with the patient, family and they are in agreement.  CODE STATUS: full  DVT Prophylaxis: IV heparin gtt and coumadin  TOTAL TIME TAKING CARE OF THIS PATIENT: 30 minutes.  >50% time spent on counselling and coordination of care pt, Drs Lucky Cowboy and [employer] D/C IN 1-2 DAYS, DEPENDING ON CLINICAL CONDITION.  Note: This dictation was prepared with Dragon dictation along with smaller phrase technology. Any transcriptional errors that result from this process are unintentional.  Glynis Hunsucker M.D on 05/04/2016 at 11:33 AM  Between 7am to 6pm - Pager - [phone]  After 6pm go to [email or web address] - password [email or web address] [facility]  Office  [phone]  CC: Primary care physician; Park Liter, DO

## 2016-05-04 NOTE — Progress Notes (Addendum)
ANTICOAGULATION CONSULT NOTE -FOLLOW UP   Pharmacy Consult for Heparin drip Indication: renal infarct, mechanical valve  No Known Allergies  Patient Measurements: Height: 5\' 6"  (167.6 cm) Weight: (!) 339 lb 12.8 oz (154.1 kg) IBW/kg (Calculated) : 63.8 Heparin Dosing Weight: 103.5 kg  Vital Signs: Temp: 98.3 F (36.8 C) (11/02 1208) Temp Source: Oral (11/02 1208) BP: 127/79 (11/02 1208) Pulse Rate: 87 (11/02 1208)  Labs:  Recent Labs  05/01/16 1930  05/02/16 0313  05/03/16 0301  05/03/16 2256 05/04/16 0736 05/04/16 1654  HGB  --   < > 14.6  < > 14.3  --   --  13.8  --   HCT  --   --  43.4  --  43.8  --   --  41.0  --   PLT  --   --  212  --  182  --   --  165  --   APTT 26  --   --   --   --   --   --   --   --   LABPROT  --   --   --   --  14.3  --   --  18.6*  --   INR  --   --   --   --  1.11  --   --  1.54  --   HEPARINUNFRC  --   --   --   < > 0.28*  < > 0.25* 0.29* 0.28*  CREATININE  --   --  1.01  --   --   --   --   --   --   < > = values in this interval not displayed.  Estimated Creatinine Clearance: 108.5 mL/min (by C-G formula based on SCr of 1.01 mg/dL).   Medical History: Past Medical History:  Diagnosis Date  . Cellulitis   . CHF (congestive heart failure) ([place])   . Diabetes mellitus without complication ([place])   . H/O mechanical aortic valve replacement   . Hypercholesterolemia   . Hypertension   . TIA (transient ischemic attack) 05/2014      Assessment: 60 yo male usually on warfarin for mechanical aortic valve but being held for colonoscopy now here with renal infarct. Per ED MD note possible GI bleed? Spoke with Dr. Leslye Peer who states may be hemorrhoids, pt with brown stool; ok to bolus heparin and provide usual dosing. Hgb 15.7, plt 224, INR 1.11.  Per lab cannot add on aPTT at this time, asked for stat draw   Goal of Therapy:  Heparin level 0.3-0.7 units/ml Monitor platelets by anticoagulation protocol: Yes   Plan:  11/2: Heparin  level result @ 0.28. Will give 1600 unit bolus and increase heparin gtt to 3000 units/hr. Will recheck Heparin level in 6 hours. Confirmed with RN that drip was not turned off or bag empty at anypoint today.  11/3 1:30 heparin level 0.43. Continue current regimen. Recheck in 6 hours to confirm.   Pharmacy will continue to follow.   Ramond Dial, Pharm.D Clinical Pharmacist

## 2016-05-05 LAB — CBC
HEMATOCRIT: 39.5 % — AB (ref 40.0–52.0)
HEMOGLOBIN: 13.2 g/dL (ref 13.0–18.0)
MCH: 29.1 pg (ref 26.0–34.0)
MCHC: 33.3 g/dL (ref 32.0–36.0)
MCV: 87.4 fL (ref 80.0–100.0)
Platelets: 185 10*3/uL (ref 150–440)
RBC: 4.52 MIL/uL (ref 4.40–5.90)
RDW: 14.3 % (ref 11.5–14.5)
WBC: 13.3 10*3/uL — AB (ref 3.8–10.6)

## 2016-05-05 LAB — HEPARIN LEVEL (UNFRACTIONATED)
Heparin Unfractionated: 0.43 IU/mL (ref 0.30–0.70)
Heparin Unfractionated: 0.31 IU/mL (ref 0.30–0.70)
Heparin Unfractionated: 0.35 IU/mL (ref 0.30–0.70)

## 2016-05-05 LAB — PROTIME-INR
INR: 1.72
PROTHROMBIN TIME: 20.4 s — AB (ref 11.4–15.2)

## 2016-05-05 MED ORDER — DOCUSATE SODIUM 100 MG PO CAPS: 100.0000 mg | ORAL_CAPSULE | Freq: Two times a day (BID) | ORAL | Status: DC | PRN

## 2016-05-05 MED ORDER — WARFARIN SODIUM 2.5 MG PO TABS
7.5000 mg | ORAL_TABLET | Freq: Once | ORAL | Status: AC
  Administered 2016-05-05: 17:00:00 7.5 mg via ORAL
  Filled 2016-05-05: qty 3

## 2016-05-05 MED ORDER — POLYETHYLENE GLYCOL 3350 17 G PO PACK
17.0000 g | PACK | Freq: Every day | ORAL | Status: DC
  Administered 2016-05-05: 17 g via ORAL
  Filled 2016-05-05 (×2): qty 1

## 2016-05-05 NOTE — Plan of Care (Signed)
Problem: Pain Managment: Goal: General experience of comfort will improve Outcome: Progressing Pt continues to require pain meds for right flank pain. Pt is attempting to decrease amount used

## 2016-05-05 NOTE — Progress Notes (Signed)
[place] [facility] [place] NAME: Roberto [name]    MRN#:  [id number]  DATE OF BIRTH:  08-Sep-1955  SUBJECTIVE:  Hospital Day: 4 days Roberto [name] is a 60 y.o. male presenting with Flank Pain .   Overnight events: No acute overnight events Interval Events: Still complains of flank pain 8/10 relief with pain medication otherwise no further symptoms  REVIEW OF SYSTEMS:  CONSTITUTIONAL: No fever, fatigue or weakness.  EYES: No blurred or double vision.  EARS, NOSE, AND THROAT: No tinnitus or ear pain.  RESPIRATORY: No cough, shortness of breath, wheezing or hemoptysis.  CARDIOVASCULAR: No chest pain, orthopnea, edema.  GASTROINTESTINAL: No nausea, vomiting, diarrhea or abdominal pain.  GENITOURINARY: No dysuria, hematuria.  ENDOCRINE: No polyuria, nocturia,  HEMATOLOGY: No anemia, easy bruising or bleeding SKIN: No rash or lesion. MUSCULOSKELETAL: No joint pain or arthritis.   NEUROLOGIC: No tingling, numbness, weakness.  PSYCHIATRY: No anxiety or depression.   DRUG ALLERGIES:  No Known Allergies  VITALS:  Blood pressure 124/66, pulse 87, temperature 99.3 F (37.4 C), temperature source Oral, resp. rate 16, height 5\' 6"  (1.676 m), weight (!) 154.1 kg (339 lb 12.8 oz), SpO2 98 %.  PHYSICAL EXAMINATION:  VITAL SIGNS: Vitals:   05/05/16 0422 05/05/16 0737  BP: 127/80 124/66  Pulse: 87 87  Resp: 19 16  Temp: 99 F (37.2 C) 99.3 F (37.4 C)   GENERAL:60 y.o.male currently in no acute distress.  HEAD: Normocephalic, atraumatic.  EYES: Pupils equal, round, reactive to light. Extraocular muscles intact. No scleral icterus.  MOUTH: Moist mucosal membrane. Dentition intact. No abscess noted.  EAR, NOSE, THROAT: Clear without exudates. No external lesions.  NECK: Supple. No thyromegaly. No nodules. No JVD.  PULMONARY: Clear to ascultation, without wheeze rails or rhonci. No use of accessory muscles, Good respiratory effort. good  air entry bilaterally CHEST: Nontender to palpation.  CARDIOVASCULAR: S1 and S2. Regular rate and rhythm. No murmurs, rubs, or gallops. No edema. Pedal pulses 2+ bilaterally.  GASTROINTESTINAL: Soft, nontender, nondistended. No masses. Positive bowel sounds. No hepatosplenomegaly.  MUSCULOSKELETAL: No swelling, clubbing, or edema. Range of motion full in all extremities.  NEUROLOGIC: Cranial nerves II through XII are intact. No gross focal neurological deficits. Sensation intact. Reflexes intact.  SKIN: No ulceration, lesions, rashes, or cyanosis. Skin warm and dry. Turgor intact.  PSYCHIATRIC: Mood, affect within normal limits. The patient is awake, alert and oriented x 3. Insight, judgment intact.      LABORATORY PANEL:   CBC  Recent Labs Lab 05/05/16 0735  WBC 13.3*  HGB 13.2  HCT 39.5*  PLT 185   ------------------------------------------------------------------------------------------------------------------  Chemistries   Recent Labs Lab 05/01/16 1230 05/02/16 0313  NA 133* 137  K 4.3 4.0  CL 99* 105  CO2 25 25  GLUCOSE 152* 130*  BUN 25* 24*  CREATININE 1.36* 1.01  CALCIUM 9.7 8.8*  AST 35  --   ALT 26  --   ALKPHOS 47  --   BILITOT 0.6  --    ------------------------------------------------------------------------------------------------------------------  Cardiac Enzymes No results for input(s): TROPONINI in the last 168 hours. ------------------------------------------------------------------------------------------------------------------  RADIOLOGY:  No results found.  EKG:   Orders placed or performed during the hospital encounter of 05/01/16  . ED EKG  . ED EKG  . EKG 12-Lead  . EKG 12-Lead  . EKG    ASSESSMENT AND PLAN:   Roberto [name] is a 60 y.o. male presenting with Flank Pain . Admitted 05/01/2016 :  Day #: 4 days  1. Renal infarct, right colon continue with warfarin and heparin and to reach therapeutic, goal 2.5-3.5 given  mechanical valve 2. History of bright red blood per rectum: Resolved will follow up gastroenterology as outpatient 3. Type 2 diabetes non-insulin-requiring continue sliding scale coverage 4. Essential hypertension metoprolol lisinopril Lasix  All the records are reviewed and case discussed with Care Management/Social Workerr. Management plans discussed with the patient, family and they are in agreement.  CODE STATUS: full TOTAL TIME TAKING CARE OF THIS PATIENT: 28 minutes.   POSSIBLE D/C IN 1-2DAYS, DEPENDING ON CLINICAL CONDITION.   Sebert Stollings,  Karenann Cai.D on 05/05/2016 at 11:18 AM  Between 7am to 6pm - Pager - [phone]  After 6pm: House Pager: - [phone]  [facility]  Office  [phone]  CC: Primary care physician; Park Liter, DO

## 2016-05-05 NOTE — Progress Notes (Signed)
ANTICOAGULATION CONSULT NOTE -FOLLOW UP   Pharmacy Consult for Warfarin Dosing Indication: Aortic Mechanical Valve and Renal infarction  No Known Allergies  Patient Measurements: Height: 5\' 6"  (167.6 cm) Weight: (!) 339 lb 12.8 oz (154.1 kg) IBW/kg (Calculated) : 63.8 Heparin Dosing Weight:    Vital Signs: Temp: 99.3 F (37.4 C) (11/03 0737) Temp Source: Oral (11/03 0737) BP: 124/66 (11/03 0737) Pulse Rate: 87 (11/03 0737)  Labs:  Recent Labs  05/03/16 0301  05/04/16 0736 05/04/16 1654 05/05/16 0125 05/05/16 0735  HGB 14.3  --  13.8  --   --  13.2  HCT 43.8  --  41.0  --   --  39.5*  PLT 182  --  165  --   --  185  LABPROT 14.3  --  18.6*  --  20.4*  --   INR 1.11  --  1.54  --  1.72  --   HEPARINUNFRC 0.28*  < > 0.29* 0.28* 0.43 0.31  < > = values in this interval not displayed.  Estimated Creatinine Clearance: 108.5 mL/min (by C-G formula based on SCr of 1.01 mg/dL).   Medical History: Past Medical History:  Diagnosis Date  . Cellulitis   . CHF (congestive heart failure) ([place])   . Diabetes mellitus without complication ([place])   . H/O mechanical aortic valve replacement   . Hypercholesterolemia   . Hypertension   . TIA (transient ischemic attack) 05/2014    Medications:  Infusions:  . heparin 3,000 Units/hr (05/05/16 [id number])    Assessment: Patient admitted for renal infarction. Patient has mechanical aortic valve and was taking Warfarin 3.75mg  (1/2 7.5mg  tablet) for 2 days then 7.5mg  for 1 day. His therapy was stopped in preparation for a colonoscopy without any bridging. Initiated on Heparin Infusion and now being restarted on Warfarin.   Per Patient: he takes 4 mg of warfarin 2 days then 7.5 mg then 4 mg for another 2days then 7.5 mg  10/30 INR 1.11  No warfarin given 10/31: INR: none documented; 10 mg  11/1: INR: 1.11; 10 mg  11/2: INR: 1.54; 7.5  11/3: INR: 1.72; 7.5   Goal of Therapy:  INR 2.5 - 3.5 Monitor platelets by anticoagulation  protocol: Yes   Plan:  Will give warfarin 7.5 mg again today. Will recheck PT/INR with am labs.   Larene Beach, PharmD  05/05/2016 8:54 AM

## 2016-05-05 NOTE — Progress Notes (Signed)
ANTICOAGULATION CONSULT NOTE -FOLLOW UP   Pharmacy Consult for Heparin drip Indication: renal infarct, mechanical valve  No Known Allergies  Patient Measurements: Height: 5\' 6"  (167.6 cm) Weight: (!) 339 lb 12.8 oz (154.1 kg) IBW/kg (Calculated) : 63.8 Heparin Dosing Weight: 103.5 kg  Vital Signs: Temp: 98.6 F (37 C) (11/03 1347) Temp Source: Oral (11/03 1347) BP: 118/71 (11/03 1347) Pulse Rate: 96 (11/03 1347)  Labs:  Recent Labs  05/03/16 0301  05/04/16 0736  05/05/16 0125 05/05/16 0735 05/05/16 1250  HGB 14.3  --  13.8  --   --  13.2  --   HCT 43.8  --  41.0  --   --  39.5*  --   PLT 182  --  165  --   --  185  --   LABPROT 14.3  --  18.6*  --  20.4*  --   --   INR 1.11  --  1.54  --  1.72  --   --   HEPARINUNFRC 0.28*  < > 0.29*  < > 0.43 0.31 0.35  < > = values in this interval not displayed.  Estimated Creatinine Clearance: 108.5 mL/min (by C-G formula based on SCr of 1.01 mg/dL).   Medical History: Past Medical History:  Diagnosis Date  . Cellulitis   . CHF (congestive heart failure) ([place])   . Diabetes mellitus without complication ([place])   . H/O mechanical aortic valve replacement   . Hypercholesterolemia   . Hypertension   . TIA (transient ischemic attack) 05/2014      Assessment: 60 yo male usually on warfarin for mechanical aortic valve but being held for colonoscopy now here with renal infarct. Per ED MD note possible GI bleed? Spoke with Dr. Leslye Peer who states may be hemorrhoids, pt with brown stool; ok to bolus heparin and provide usual dosing. Hgb 15.7, plt 224, INR 1.11.  Per lab cannot add on aPTT at this time, asked for stat draw   Goal of Therapy:  Heparin level 0.3-0.7 units/ml Monitor platelets by anticoagulation protocol: Yes   Plan:  11/2: Heparin level result @ 0.28. Will give 1600 unit bolus and increase heparin gtt to 3000 units/hr. Will recheck Heparin level in 6 hours. Confirmed with RN that drip was not turned off or bag  empty at anypoint today.  11/3 1:30 heparin level 0.43. Continue current regimen. Recheck in 6 hours to confirm.  11/3: Heparin level resulted @ 0.31. Continue current regimen for now; however will recheck heparin level @ 13:00 since heparin level is therapeutic but trending downward.   11/3 Heparin level resulted @ 0.35. Continue current regimen. Will recheck heparin level and CBC with am labs. Pharmacy to follow per consult.       Pharmacy will continue to follow.   Larene Beach, PharmD  Clinical Pharmacist

## 2016-05-05 NOTE — Progress Notes (Signed)
ANTICOAGULATION CONSULT NOTE -FOLLOW UP   Pharmacy Consult for Heparin drip Indication: renal infarct, mechanical valve  No Known Allergies  Patient Measurements: Height: 5\' 6"  (167.6 cm) Weight: (!) 339 lb 12.8 oz (154.1 kg) IBW/kg (Calculated) : 63.8 Heparin Dosing Weight: 103.5 kg  Vital Signs: Temp: 99.3 F (37.4 C) (11/03 0737) Temp Source: Oral (11/03 0737) BP: 124/66 (11/03 0737) Pulse Rate: 87 (11/03 0737)  Labs:  Recent Labs  05/03/16 0301  05/04/16 0736 05/04/16 1654 05/05/16 0125 05/05/16 0735  HGB 14.3  --  13.8  --   --  13.2  HCT 43.8  --  41.0  --   --  39.5*  PLT 182  --  165  --   --  185  LABPROT 14.3  --  18.6*  --  20.4*  --   INR 1.11  --  1.54  --  1.72  --   HEPARINUNFRC 0.28*  < > 0.29* 0.28* 0.43 0.31  < > = values in this interval not displayed.  Estimated Creatinine Clearance: 108.5 mL/min (by C-G formula based on SCr of 1.01 mg/dL).   Medical History: Past Medical History:  Diagnosis Date  . Cellulitis   . CHF (congestive heart failure) ([place])   . Diabetes mellitus without complication ([place])   . H/O mechanical aortic valve replacement   . Hypercholesterolemia   . Hypertension   . TIA (transient ischemic attack) 05/2014      Assessment: 60 yo male usually on warfarin for mechanical aortic valve but being held for colonoscopy now here with renal infarct. Per ED MD note possible GI bleed? Spoke with Dr. Leslye Peer who states may be hemorrhoids, pt with brown stool; ok to bolus heparin and provide usual dosing. Hgb 15.7, plt 224, INR 1.11.  Per lab cannot add on aPTT at this time, asked for stat draw   Goal of Therapy:  Heparin level 0.3-0.7 units/ml Monitor platelets by anticoagulation protocol: Yes   Plan:  11/2: Heparin level result @ 0.28. Will give 1600 unit bolus and increase heparin gtt to 3000 units/hr. Will recheck Heparin level in 6 hours. Confirmed with RN that drip was not turned off or bag empty at anypoint  today.  11/3 1:30 heparin level 0.43. Continue current regimen. Recheck in 6 hours to confirm.  11/3: Heparin level resulted @ 0.31. Continue current regimen for now; however will recheck heparin level @ 13:00 since heparin level is therapeutic but trending downward.      Pharmacy will continue to follow.   Larene Beach, PharmD  Clinical Pharmacist

## 2016-05-06 LAB — GLUCOSE, CAPILLARY: Glucose-Capillary: 129 mg/dL — ABNORMAL HIGH (ref 65–99)

## 2016-05-06 LAB — HEPARIN LEVEL (UNFRACTIONATED): Heparin Unfractionated: 0.36 IU/mL (ref 0.30–0.70)

## 2016-05-06 LAB — CBC
HEMATOCRIT: 39.8 % — AB (ref 40.0–52.0)
Hemoglobin: 13.7 g/dL (ref 13.0–18.0)
MCH: 29.6 pg (ref 26.0–34.0)
MCHC: 34.5 g/dL (ref 32.0–36.0)
MCV: 85.9 fL (ref 80.0–100.0)
PLATELETS: 198 10*3/uL (ref 150–440)
RBC: 4.63 MIL/uL (ref 4.40–5.90)
RDW: 14 % (ref 11.5–14.5)
WBC: 12.3 10*3/uL — ABNORMAL HIGH (ref 3.8–10.6)

## 2016-05-06 LAB — PROTIME-INR
INR: 2
PROTHROMBIN TIME: 23 s — AB (ref 11.4–15.2)

## 2016-05-06 MED ORDER — OXYCODONE HCL 10 MG PO TABS: 10.0000 mg | ORAL_TABLET | ORAL | 0 refills | Status: DC | PRN

## 2016-05-06 MED ORDER — WARFARIN SODIUM 7.5 MG PO TABS: 7.5000 mg | ORAL_TABLET | Freq: Once | ORAL | Status: DC

## 2016-05-06 NOTE — Progress Notes (Signed)
ANTICOAGULATION CONSULT NOTE -FOLLOW UP   Pharmacy Consult for Warfarin Dosing Indication: Aortic Mechanical Valve and Renal infarction  No Known Allergies  Patient Measurements: Height: 5\' 6"  (167.6 cm) Weight: (!) 339 lb 12.8 oz (154.1 kg) IBW/kg (Calculated) : 63.8  Vital Signs: Temp: 98.9 F (37.2 C) (11/04 0434) Temp Source: Oral (11/04 0434) BP: 111/65 (11/04 0434) Pulse Rate: 87 (11/04 0434)  Labs:  Recent Labs  05/04/16 0736  05/05/16 0125 05/05/16 0735 05/05/16 1250 05/06/16 0448  HGB 13.8  --   --  13.2  --  13.7  HCT 41.0  --   --  39.5*  --  39.8*  PLT 165  --   --  185  --  198  LABPROT 18.6*  --  20.4*  --   --  23.0*  INR 1.54  --  1.72  --   --  2.00  HEPARINUNFRC 0.29*  < > 0.43 0.31 0.35 0.36  < > = values in this interval not displayed.  Estimated Creatinine Clearance: 108.5 mL/min (by C-G formula based on SCr of 1.01 mg/dL).   Medical History: Past Medical History:  Diagnosis Date  . Cellulitis   . CHF (congestive heart failure) ([place])   . Diabetes mellitus without complication ([place])   . H/O mechanical aortic valve replacement   . Hypercholesterolemia   . Hypertension   . TIA (transient ischemic attack) 05/2014    Medications:  Infusions:  . heparin 3,000 Units/hr (05/06/16 0107)    Assessment: Patient admitted for renal infarction. Patient has mechanical aortic valve and was taking Warfarin 3.75mg  (1/2 of 7.5mg  tablet) for 2 days then 7.5mg  for 1 day. His therapy was stopped in preparation for a colonoscopy without any bridging. Initiated on Heparin Infusion and now being restarted on Warfarin.  Per Patient: he takes 4 mg of warfarin 2 days then 7.5 mg then 4 mg for another 2days then 7.5 mg  10/30: INR 1.11  No warfarin given 10/31: INR: none documented; 10 mg  11/1:  INR: 1.11; 10 mg  11/2:  INR: 1.54; 7.5 mg  11/3:  INR: 1.72; 7.5 mg 11/4:  INR: 2.0  Goal of Therapy:  INR 2.5 - 3.5 Monitor platelets by anticoagulation  protocol: Yes   Plan:  Will give warfarin 7.5 mg again today. Will recheck PT/INR with am labs.   Olivia Canter [facility] Clinical Pharmacist 05/06/2016 9:02 AM

## 2016-05-06 NOTE — Discharge Summary (Signed)
[facility] NAME: Roberto [name]    MR#:  [id number]  DATE OF BIRTH:  06/15/1956  DATE OF ADMISSION:  05/01/2016 ADMITTING PHYSICIAN: Fritzi Mandes, MD  DATE OF DISCHARGE: 05/06/16  PRIMARY CARE PHYSICIAN: Park Liter, DO    ADMISSION DIAGNOSIS:  Renal infarct ([place]) [N28.0] Acute renal insufficiency [N28.9] Right flank pain [R10.9] Gastrointestinal hemorrhage, unspecified gastrointestinal hemorrhage type [K92.2]  DISCHARGE DIAGNOSIS:  Active Problems:   Renal infarct ([place])   SECONDARY DIAGNOSIS:   Past Medical History:  Diagnosis Date  . Cellulitis   . CHF (congestive heart failure) ([place])   . Diabetes mellitus without complication ([place])   . H/O mechanical aortic valve replacement   . Hypercholesterolemia   . Hypertension   . TIA (transient ischemic attack) 05/2014    HOSPITAL COURSE:  Roberto [name]  is a 60 y.o. male admitted 05/01/2016 with chief complaint Flank Pain . Please see H&P performed by Fritzi Mandes, MD for further information. Patient presented with the above symptoms found to have renal infarct. He was off warfarin without bridging therapy for colonoscopy. After finding blood clot, started on heparin gtt and warfarin. INR came up to an acceptable level and pain reasonably controlled on discharge  DISCHARGE CONDITIONS:   stable  CONSULTS OBTAINED:  Treatment Team:  Algernon Huxley, MD  DRUG ALLERGIES:  No Known Allergies  DISCHARGE MEDICATIONS:   Current Discharge Medication List    START taking these medications   Details  oxyCODONE 10 MG TABS Take 1 tablet (10 mg total) by mouth every 4 (four) hours as needed for moderate pain or breakthrough pain. Qty: 30 tablet, Refills: 0      CONTINUE these medications which have NOT CHANGED   Details  albuterol (PROVENTIL HFA;VENTOLIN HFA) 108 (90 Base) MCG/ACT inhaler Inhale 2 puffs into the lungs every 6 (six) hours as needed for wheezing or  shortness of breath. Qty: 1 Inhaler, Refills: 0    aspirin 81 MG chewable tablet Chew 81 mg by mouth daily.      cyclobenzaprine (FLEXERIL) 10 MG tablet Take 1 tablet (10 mg total) by mouth 3 (three) times daily as needed for muscle spasms. Qty: 90 tablet, Refills: 0    Fluticasone-Salmeterol (ADVAIR) 250-50 MCG/DOSE AEPB Inhale 1 puff into the lungs 2 (two) times daily.    furosemide (LASIX) 40 MG tablet Take 1 tablet (40 mg total) by mouth daily. Qty: 90 tablet, Refills: 1    lisinopril (PRINIVIL,ZESTRIL) 2.5 MG tablet Take 1 tablet (2.5 mg total) by mouth daily. Qty: 90 tablet, Refills: 1    metoprolol tartrate (LOPRESSOR) 25 MG tablet Take 1 tablet (25 mg total) by mouth 2 (two) times daily. Qty: 180 tablet, Refills: 1    simvastatin (ZOCOR) 40 MG tablet TAKE 1 TABLET (40 MG TOTAL) BY MOUTH AT BEDTIME. Qty: 90 tablet, Refills: 1    tiotropium (SPIRIVA) 18 MCG inhalation capsule Place 18 mcg into inhaler and inhale daily.    !! warfarin (COUMADIN) 4 MG tablet Take 1 tablet (4 mg total) by mouth daily. Qty: 90 tablet, Refills: 6    !! warfarin (COUMADIN) 7.5 MG tablet Take 1 tablet (7.5 mg total) by mouth daily. Qty: 240 tablet, Refills: 1     !! - Potential duplicate medications found. Please discuss with provider.       DISCHARGE INSTRUCTIONS:  Take warfarin 7.5 mg tonight (11/4), tomorrow (11/5), then switch to regular dose - follow with PCP as  soon as possible for INR check  DIET:  Regular diet  DISCHARGE CONDITION:  Stable  ACTIVITY:  Activity as tolerated  OXYGEN:  Home Oxygen: No.   Oxygen Delivery: room air  DISCHARGE LOCATION:  home   If you experience worsening of your admission symptoms, develop shortness of breath, life threatening emergency, suicidal or homicidal thoughts you must seek medical attention immediately by calling 911 or calling your MD immediately  if symptoms less severe.  You Must read complete instructions/literature along with  all the possible adverse reactions/side effects for all the Medicines you take and that have been prescribed to you. Take any new Medicines after you have completely understood and accpet all the possible adverse reactions/side effects.   Please note  You were cared for by a hospitalist during your hospital stay. If you have any questions about your discharge medications or the care you received while you were in the hospital after you are discharged, you can call the unit and asked to speak with the hospitalist on call if the hospitalist that took care of you is not available. Once you are discharged, your primary care physician will handle any further medical issues. Please note that NO REFILLS for any discharge medications will be authorized once you are discharged, as it is imperative that you return to your primary care physician (or establish a relationship with a primary care physician if you do not have one) for your aftercare needs so that they can reassess your need for medications and monitor your lab values.    On the day of Discharge:   VITAL SIGNS:  Blood pressure 111/65, pulse 87, temperature 98.9 F (37.2 C), temperature source Oral, resp. rate 19, height 5\' 6"  (1.676 m), weight (!) 154.1 kg (339 lb 12.8 oz), SpO2 98 %.  I/O:   Intake/Output Summary (Last 24 hours) at 05/06/16 0917 Last data filed at 05/05/16 1800  Gross per 24 hour  Intake              120 ml  Output                0 ml  Net              120 ml    PHYSICAL EXAMINATION:  GENERAL:  60 y.o.-year-old patient lying in the bed with no acute distress.  EYES: Pupils equal, round, reactive to light and accommodation. No scleral icterus. Extraocular muscles intact.  HEENT: Head atraumatic, normocephalic. Oropharynx and nasopharynx clear.  NECK:  Supple, no jugular venous distention. No thyroid enlargement, no tenderness.  LUNGS: Normal breath sounds bilaterally, no wheezing, rales,rhonchi or crepitation. No use  of accessory muscles of respiration.  CARDIOVASCULAR: S1, S2 normal. No murmurs, rubs, or gallops.  ABDOMEN: Soft, non-tender, non-distended. Bowel sounds present. No organomegaly or mass.  EXTREMITIES: No pedal edema, cyanosis, or clubbing.  NEUROLOGIC: Cranial nerves II through XII are intact. Muscle strength 5/5 in all extremities. Sensation intact. Gait not checked.  PSYCHIATRIC: The patient is alert and oriented x 3.  SKIN: No obvious rash, lesion, or ulcer.   DATA REVIEW:   CBC  Recent Labs Lab 05/06/16 0448  WBC 12.3*  HGB 13.7  HCT 39.8*  PLT 198    Chemistries   Recent Labs Lab 05/01/16 1230 05/02/16 0313  NA 133* 137  K 4.3 4.0  CL 99* 105  CO2 25 25  GLUCOSE 152* 130*  BUN 25* 24*  CREATININE 1.36* 1.01  CALCIUM 9.7 8.8*  AST 35  --   ALT 26  --   ALKPHOS 47  --   BILITOT 0.6  --     Cardiac Enzymes No results for input(s): TROPONINI in the last 168 hours.  Microbiology Results  Results for orders placed or performed in visit on 04/30/14  Culture, blood (single)     Status: None   Collection Time: 04/30/14 12:51 AM  Result Value Ref Range Status   Micro Text Report   Final       COMMENT                   NO GROWTH AEROBICALLY/ANAEROBICALLY IN 5 DAYS   ANTIBIOTIC                                                      Culture, blood (single)     Status: None   Collection Time: 04/30/14 12:53 AM  Result Value Ref Range Status   Micro Text Report   Final       COMMENT                   NO GROWTH AEROBICALLY/ANAEROBICALLY IN 5 DAYS   ANTIBIOTIC                                                      Wound culture     Status: None   Collection Time: 05/01/14 12:32 PM  Result Value Ref Range Status   Micro Text Report   Final       SOURCE: RIGHT LOWER LEG    COMMENT                   NO GROWTH AEROBICALLY/ANAEROBICALLY IN 4 DAYS   GRAM STAIN                FEW WHITE BLOOD CELLS   GRAM STAIN                RARE GRAM VARIABLE ROD   ANTIBIOTIC                                                         RADIOLOGY:  No results found.   Management plans discussed with the patient, family and they are in agreement.  CODE STATUS:     Code Status Orders        Start     Ordered   05/01/16 1907  Full code  Continuous     05/01/16 1907    Code Status History    Date Active Date Inactive Code Status Order ID Comments User Context   This patient has a current code status but no historical code status.      TOTAL TIME TAKING CARE OF THIS PATIENT: 33 minutes.    Hower,  Karenann Cai.D on 05/06/2016 at 9:17 AM  Between 7am to 6pm - Pager - [phone]  After 6pm go to [email or web address] - [employer]  Hospitalists  Office  [phone]  CC: Primary care physician; Park Liter, DO

## 2016-05-06 NOTE — Progress Notes (Signed)
MD order received to discharge pt home today; verbally reviewed AVS with pt including medications/gave Rx for Oxycodone to pt; follow up appointment/pt to call MD's office on 05/08/16 to schedule appointment; no questions voiced at this time; pt's discharge pending arrival of his ride home

## 2016-05-06 NOTE — Progress Notes (Signed)
Pt's ride present for discharge home; pt discharged via wheelchair by nursing to the visitor's entrance

## 2016-05-06 NOTE — Progress Notes (Signed)
ANTICOAGULATION CONSULT NOTE -FOLLOW UP   Pharmacy Consult for Heparin drip Indication: renal infarct, mechanical valve  No Known Allergies  Patient Measurements: Height: 5\' 6"  (167.6 cm) Weight: (!) 339 lb 12.8 oz (154.1 kg) IBW/kg (Calculated) : 63.8 Heparin Dosing Weight: 103.5 kg  Vital Signs: Temp: 98.9 F (37.2 C) (11/04 0434) Temp Source: Oral (11/04 0434) BP: 111/65 (11/04 0434) Pulse Rate: 87 (11/04 0434)  Labs:  Recent Labs  05/04/16 0736  05/05/16 0125 05/05/16 0735 05/05/16 1250 05/06/16 0448  HGB 13.8  --   --  13.2  --  13.7  HCT 41.0  --   --  39.5*  --  39.8*  PLT 165  --   --  185  --  198  LABPROT 18.6*  --  20.4*  --   --  23.0*  INR 1.54  --  1.72  --   --  2.00  HEPARINUNFRC 0.29*  < > 0.43 0.31 0.35 0.36  < > = values in this interval not displayed.  Estimated Creatinine Clearance: 108.5 mL/min (by C-G formula based on SCr of 1.01 mg/dL).   Medical History: Past Medical History:  Diagnosis Date  . Cellulitis   . CHF (congestive heart failure) ([place])   . Diabetes mellitus without complication ([place])   . H/O mechanical aortic valve replacement   . Hypercholesterolemia   . Hypertension   . TIA (transient ischemic attack) 05/2014      Assessment: 60 yo male usually on warfarin for mechanical aortic valve but being held for colonoscopy now here with renal infarct. Per ED MD note possible GI bleed? Spoke with Dr. Leslye Peer who states may be hemorrhoids, pt with brown stool; ok to bolus heparin and provide usual dosing. Hgb 15.7, plt 224, INR 1.11.  Per lab cannot add on aPTT at this time, asked for stat draw   Goal of Therapy:  Heparin level 0.3-0.7 units/ml Monitor platelets by anticoagulation protocol: Yes   Plan:  11/2: Heparin level result @ 0.28. Will give 1600 unit bolus and increase heparin gtt to 3000 units/hr. Will recheck Heparin level in 6 hours. Confirmed with RN that drip was not turned off or bag empty at anypoint  today.  11/3 1:30 heparin level 0.43. Continue current regimen. Recheck in 6 hours to confirm.  11/3: Heparin level resulted @ 0.31. Continue current regimen for now; however will recheck heparin level @ 13:00 since heparin level is therapeutic but trending downward.   11/3 Heparin level resulted @ 0.35. Continue current regimen. Will recheck heparin level and CBC with am labs. Pharmacy to follow per consult.   11/4 AM heparin level 0.36. Continue current regimen Recheck CBC and heparin level tomorrow AM.     Pharmacy will continue to follow.   Larene Beach, PharmD  Clinical Pharmacist

## 2016-05-07 ENCOUNTER — Encounter: Payer: Self-pay | Admitting: Family Medicine

## 2016-05-08 ENCOUNTER — Telehealth: Payer: Self-pay | Admitting: Family Medicine

## 2016-05-08 ENCOUNTER — Ambulatory Visit (INDEPENDENT_AMBULATORY_CARE_PROVIDER_SITE_OTHER): Payer: Medicare Other | Admitting: Family Medicine

## 2016-05-08 ENCOUNTER — Encounter: Payer: Self-pay | Admitting: Family Medicine

## 2016-05-08 VITALS — BP 125/85 | HR 72 | Temp 97.9°F | Wt 337.0 lb

## 2016-05-08 DIAGNOSIS — Z952 Presence of prosthetic heart valve: Secondary | ICD-10-CM

## 2016-05-08 DIAGNOSIS — N179 Acute kidney failure, unspecified: Secondary | ICD-10-CM

## 2016-05-08 LAB — COAGUCHEK XS/INR WAIVED
INR: 3.9 — ABNORMAL HIGH (ref 0.9–1.1)
Prothrombin Time: 47.1 s

## 2016-05-08 NOTE — Progress Notes (Signed)
   BP 125/85   Pulse 72   Temp 97.9 F (36.6 C)   Wt (!) 337 lb (152.9 kg)   SpO2 97%   BMI 54.39 kg/m    Subjective:    Patient ID: Roberto [name], male    DOB: 1956/03/07, 60 y.o.   MRN: [id number]  HPI: Ernie Halas is a 60 y.o. male  Chief Complaint  Patient presents with  . Anticoagulation    7.5mg , 4mg , 4mg , 7.5mg  alternating.  . Medication Refill    He got a rx for Oxycodone at the hospital on Saturday. States he lost the rx and he needs a refill. Patient's caregiver states he's been having to take her pain medicine since he has none.   Patient presents for INR follow up today. Taking 7.5 mg, 4 mg, 4 mg, 7.5 mg alernation. Recent hospitalization for renal infarct. Was given oxycodone at discharge but states he lost the script and has been having to take his girlfriend's pain medicine. He would like a new script today. No other new meds, bleeding bruising issues, diet changes.   Relevant past medical, surgical, family and social history reviewed and updated as indicated. Interim medical history since our last visit reviewed. Allergies and medications reviewed and updated.  Review of Systems  Constitutional: Negative.   HENT: Negative.   Respiratory: Negative.   Cardiovascular: Negative.   Gastrointestinal: Negative.   Genitourinary: Negative.   Musculoskeletal: Negative.   Skin: Negative.   Neurological: Negative.   Hematological: Does not bruise/bleed easily.  Psychiatric/Behavioral: Negative.     Per HPI unless specifically indicated above     Objective:    BP 125/85   Pulse 72   Temp 97.9 F (36.6 C)   Wt (!) 337 lb (152.9 kg)   SpO2 97%   BMI 54.39 kg/m   Wt Readings from Last 3 Encounters:  05/08/16 (!) 337 lb (152.9 kg)  05/01/16 (!) 339 lb 12.8 oz (154.1 kg)  04/19/16 (!) 351 lb (159.2 kg)    Physical Exam  Constitutional: He is oriented to person, place, and time. He appears well-developed and well-nourished. No distress.  HENT:  Head:  Atraumatic.  Eyes: Conjunctivae are normal. No scleral icterus.  Neck: Normal range of motion. Neck supple.  Cardiovascular: Normal rate.   Pulmonary/Chest: Effort normal. No respiratory distress.  Musculoskeletal: Normal range of motion.  Neurological: He is alert and oriented to person, place, and time.  Skin: Skin is warm and dry.  Psychiatric: He has a normal mood and affect. His behavior is normal.  Nursing note and vitals reviewed.   Results for orders placed or performed in visit on 05/08/16  CoaguChek XS/INR Waived  Result Value Ref Range   INR 3.9 (H) 0.9 - 1.1   Prothrombin Time 47.1 sec      Assessment & Plan:   Problem List Items Addressed This Visit      Cardiovascular and Mediastinum   Mechanical heart valve present   Relevant Orders   CoaguChek XS/INR Waived (Completed)     Other   H/O aortic valve replacement - Primary   Relevant Orders   CoaguChek XS/INR Waived (Completed)      Continue current coumadin regimen, maintain steady diet. Recheck in 2 weeks. Discussed that they would have to call the hospitalist to see about getting a new script for pain medicine.   Follow up plan: Return in about 2 weeks (around 05/22/2016) for INR.

## 2016-05-08 NOTE — Telephone Encounter (Signed)
Routing to provider  

## 2016-05-08 NOTE — Telephone Encounter (Signed)
Ms Roberto [name] called and would like to get a referral to go to see someone about the pts kidneys. Due to acute renal insufficiency they would like to follow up with a specialist. His pain is now 6 out of 10.

## 2016-05-09 ENCOUNTER — Ambulatory Visit: Payer: Medicare Other | Admitting: Family Medicine

## 2016-05-09 ENCOUNTER — Telehealth: Payer: Self-pay | Admitting: Family Medicine

## 2016-05-09 MED ORDER — OXYCODONE HCL 10 MG PO TABS: 10.0000 mg | ORAL_TABLET | ORAL | 0 refills | Status: DC | PRN

## 2016-05-09 NOTE — Telephone Encounter (Signed)
appt scheduled for 05/12/16.

## 2016-05-09 NOTE — Telephone Encounter (Signed)
Did patient agree to come in for a hospital follow up?

## 2016-05-09 NOTE — Telephone Encounter (Signed)
[name] called back and discussed with her.   Discussed hospitalization.   Discussed his INR.   Will come in on Friday for appointment. Will check his CMP at that time. Will also check him for hypercoag work up at that time.   Lost Rx for his pain medicine. Rx found, and they have it.

## 2016-05-09 NOTE — Telephone Encounter (Signed)
Called and [facility] for patient or SO to call me back. Will check in on medications to make sure he has everything he needs, that he knows what meds he is taking and that he has follow up.   Concern about renal infarcts if he has ever had hypercoagulable work up. Will discuss when the call back.

## 2016-05-09 NOTE — Telephone Encounter (Signed)
I will put the referral in and we will see what happens.

## 2016-05-09 NOTE — Telephone Encounter (Signed)
Ms Roberto [name] called and stated that instead of just the INR they originally requested yesterday that the patient actually needed hospital follow up.

## 2016-05-09 NOTE — Telephone Encounter (Signed)
He also needs a hospital follow up. Not sure if they have that set yet. With his medicare, I will need to see him to get the referral.

## 2016-05-09 NOTE — Telephone Encounter (Signed)
Spoke with patient's girlfriend, he states that he is not coming back in. She is going to try and talk to him to see if he will agree to come in, she will call and let us know.

## 2016-05-12 ENCOUNTER — Ambulatory Visit (INDEPENDENT_AMBULATORY_CARE_PROVIDER_SITE_OTHER): Payer: Medicare Other | Admitting: Family Medicine

## 2016-05-12 ENCOUNTER — Encounter: Payer: Self-pay | Admitting: Family Medicine

## 2016-05-12 VITALS — BP 108/69 | HR 72 | Temp 98.1°F | Wt 327.8 lb

## 2016-05-12 DIAGNOSIS — B370 Candidal stomatitis: Secondary | ICD-10-CM

## 2016-05-12 DIAGNOSIS — N179 Acute kidney failure, unspecified: Secondary | ICD-10-CM

## 2016-05-12 DIAGNOSIS — N280 Ischemia and infarction of kidney: Secondary | ICD-10-CM | POA: Diagnosis not present

## 2016-05-12 DIAGNOSIS — Z952 Presence of prosthetic heart valve: Secondary | ICD-10-CM

## 2016-05-12 LAB — COAGUCHEK XS/INR WAIVED
INR: 5.2 — ABNORMAL HIGH (ref 0.9–1.1)
Prothrombin Time: 62.5 s

## 2016-05-12 MED ORDER — NYSTATIN 100000 UNIT/ML MT SUSP: 5.0000 mL | Freq: Four times a day (QID) | OROMUCOSAL | 0 refills | Status: DC

## 2016-05-12 NOTE — Assessment & Plan Note (Signed)
Off coumadin for 4 days for colonoscopy and got bilateral renal infarcts. Back on coumadin now and resolving, pain decreased. However, concern for possible hypercoagulable state given rapidity of infarcts and clots. Will get him into hematology for evaluation. Referral generated today. Continue coumadin. Supra-therapeutic today. Rechecking INR on Tuesday.

## 2016-05-12 NOTE — Assessment & Plan Note (Signed)
INR goal 2.5-3.5. Currently at 5.2. Will stay on 4mg  daily and recheck on Tuesday.

## 2016-05-12 NOTE — Progress Notes (Signed)
BP 108/69 (BP Location: Left Arm, Patient Position: Sitting, Cuff Size: Large)   Pulse 72   Temp 98.1 F (36.7 C)   Wt (!) 327 lb 12.8 oz (148.7 kg)   SpO2 95%   BMI 52.91 kg/m    Subjective:    Patient ID: Roberto [name], male    DOB: 1955-10-03, 60 y.o.   MRN: [id number]  HPI: Roberto [name] is a 60 y.o. male  Chief Complaint  Patient presents with  . Hospitalization Follow-up   HOSPITAL FOLLOW UP- Off his coumadin for 4 days to get his colonoscopy. Severe pain. Went to ER and diagnosed with bilateral renal infarcts. INR 1.1. In the hospital for 5 days getting his INR right. Here today for hospital follow up and recheck on his INR Time since discharge: 6 days Hospital/facility: [facility] Diagnosis:  Renal infarct ([place]) [N28.0] Acute renal insufficiency [N28.9] Right flank pain [R10.9] Gastrointestinal hemorrhage, unspecified gastrointestinal hemorrhage type [K92.2] Procedures/tests: labs, CT angio of the chest, CT renal stone study, CXR Consultants: Gastroenterology (Dr. Truman Hayward), Vascular (Dr. Lucky Cowboy) New medications: Oxycodone for pain Discharge instructions:  INR ASAP, follow up here Status: better   Having no appetite. Has not been eating. Has lost 10 lbs in 4 days. Tongue feels prickly.   No family history of clotting disorders. Unsure if anyone ever had any clots.   Relevant past medical, surgical, family and social history reviewed and updated as indicated. Interim medical history since our last visit reviewed. Allergies and medications reviewed and updated.  Review of Systems  Constitutional: Negative.   Respiratory: Negative.   Cardiovascular: Negative.   Gastrointestinal: Negative.   Psychiatric/Behavioral: Negative.     Per HPI unless specifically indicated above     Objective:    BP 108/69 (BP Location: Left Arm, Patient Position: Sitting, Cuff Size: Large)   Pulse 72   Temp 98.1 F (36.7 C)   Wt (!) 327 lb 12.8 oz (148.7 kg)   SpO2 95%   BMI 52.91  kg/m   Wt Readings from Last 3 Encounters:  05/12/16 (!) 327 lb 12.8 oz (148.7 kg)  05/08/16 (!) 337 lb (152.9 kg)  05/01/16 (!) 339 lb 12.8 oz (154.1 kg)    Physical Exam  Constitutional: He is oriented to person, place, and time. He appears well-developed and well-nourished. No distress.  HENT:  Head: Normocephalic and atraumatic.  Right Ear: Hearing normal.  Left Ear: Hearing normal.  Nose: Nose normal.  Eyes: Conjunctivae and lids are normal. Right eye exhibits no discharge. Left eye exhibits no discharge. No scleral icterus.  Cardiovascular: Normal rate, regular rhythm, normal heart sounds and intact distal pulses.  Exam reveals no gallop and no friction rub.   No murmur heard. Pulmonary/Chest: Effort normal and breath sounds normal. No respiratory distress. He has no wheezes. He has no rales. He exhibits no tenderness.  Musculoskeletal: Normal range of motion.  Neurological: He is alert and oriented to person, place, and time.  Skin: Skin is warm, dry and intact. No rash noted. No erythema. No pallor.  Psychiatric: He has a normal mood and affect. His speech is normal and behavior is normal. Judgment and thought content normal. Cognition and memory are normal.  Nursing note and vitals reviewed.   Results for orders placed or performed in visit on 05/12/16  CoaguChek XS/INR Waived  Result Value Ref Range   INR 5.2 (H) 0.9 - 1.1   Prothrombin Time 62.5 sec      Assessment & Plan:  Problem List Items Addressed This Visit      Cardiovascular and Mediastinum   Mechanical heart valve present    INR goal 2.5-3.5. Currently at 5.2. Will stay on 4mg  daily and recheck on Tuesday.       Relevant Orders   CoaguChek XS/INR Waived (Completed)   CBC with Differential/Platelet   Renal infarct ([place])    Off coumadin for 4 days for colonoscopy and got bilateral renal infarcts. Back on coumadin now and resolving, pain decreased. However, concern for possible hypercoagulable state  given rapidity of infarcts and clots. Will get him into hematology for evaluation. Referral generated today. Continue coumadin. Supra-therapeutic today. Rechecking INR on Tuesday.      Relevant Orders   CBC with Differential/Platelet   Comprehensive metabolic panel   Ambulatory referral to Hematology    Other Visit Diagnoses    AKI (acute kidney injury) ([place])    -  Primary   Rechecking labs today. Await results. Will refer to nephrology if not getting better   Relevant Orders   CBC with Differential/Platelet   Comprehensive metabolic panel   Thrush       Will start him on nystatin solution. Call if not getting better.    Relevant Medications   nystatin (MYCOSTATIN) 100000 UNIT/ML suspension       Follow up plan: Return Tuesday, INR.

## 2016-05-13 LAB — COMPREHENSIVE METABOLIC PANEL
A/G RATIO: 1.3 (ref 1.2–2.2)
ALK PHOS: 65 IU/L (ref 39–117)
ALT: 27 IU/L (ref 0–44)
AST: 27 IU/L (ref 0–40)
Albumin: 4.1 g/dL (ref 3.6–4.8)
BUN/Creatinine Ratio: 11 (ref 10–24)
BUN: 13 mg/dL (ref 8–27)
Bilirubin Total: 0.3 mg/dL (ref 0.0–1.2)
CO2: 25 mmol/L (ref 18–29)
CREATININE: 1.23 mg/dL (ref 0.76–1.27)
Calcium: 9 mg/dL (ref 8.6–10.2)
Chloride: 98 mmol/L (ref 96–106)
GFR calc Af Amer: 73 mL/min/{1.73_m2} (ref >59)
GFR calc non Af Amer: 63 mL/min/{1.73_m2} (ref >59)
GLOBULIN, TOTAL: 3.2 g/dL (ref 1.5–4.5)
Glucose: 92 mg/dL (ref 65–99)
POTASSIUM: 4.3 mmol/L (ref 3.5–5.2)
SODIUM: 140 mmol/L (ref 134–144)
Total Protein: 7.3 g/dL (ref 6.0–8.5)

## 2016-05-13 LAB — CBC WITH DIFFERENTIAL/PLATELET
BASOS ABS: 0.1 10*3/uL (ref 0.0–0.2)
Basos: 1 %
EOS (ABSOLUTE): 0.2 10*3/uL (ref 0.0–0.4)
EOS: 2 %
HEMATOCRIT: 43.8 % (ref 37.5–51.0)
Hemoglobin: 14.9 g/dL (ref 12.6–17.7)
Immature Grans (Abs): 0.2 10*3/uL — ABNORMAL HIGH (ref 0.0–0.1)
Immature Granulocytes: 2 %
LYMPHS ABS: 2.7 10*3/uL (ref 0.7–3.1)
Lymphs: 25 %
MCH: 28.4 pg (ref 26.6–33.0)
MCHC: 34 g/dL (ref 31.5–35.7)
MCV: 84 fL (ref 79–97)
MONOS ABS: 1.1 10*3/uL — AB (ref 0.1–0.9)
Monocytes: 10 %
Neutrophils Absolute: 6.6 10*3/uL (ref 1.4–7.0)
Neutrophils: 60 %
PLATELETS: 414 10*3/uL — AB (ref 150–379)
RBC: 5.24 x10E6/uL (ref 4.14–5.80)
RDW: 14.1 % (ref 12.3–15.4)
WBC: 10.6 10*3/uL (ref 3.4–10.8)

## 2016-05-15 ENCOUNTER — Telehealth: Payer: Self-pay | Admitting: Family Medicine

## 2016-05-15 NOTE — Telephone Encounter (Signed)
Please let him know that his kidney function is back to normal. Up to them if they still want to see the kidney doctor, but I don't think it's necessary because his labs look good, but I will be happy to have them see one.

## 2016-05-15 NOTE — Telephone Encounter (Signed)
Left voicemail letting patient know his results.

## 2016-05-16 ENCOUNTER — Telehealth: Payer: Self-pay | Admitting: Family Medicine

## 2016-05-16 ENCOUNTER — Ambulatory Visit (INDEPENDENT_AMBULATORY_CARE_PROVIDER_SITE_OTHER): Payer: Medicare Other | Admitting: Family Medicine

## 2016-05-16 ENCOUNTER — Encounter: Payer: Self-pay | Admitting: Family Medicine

## 2016-05-16 VITALS — BP 117/69 | HR 62 | Temp 97.4°F | Wt 327.0 lb

## 2016-05-16 DIAGNOSIS — Z952 Presence of prosthetic heart valve: Secondary | ICD-10-CM | POA: Diagnosis not present

## 2016-05-16 DIAGNOSIS — R791 Abnormal coagulation profile: Secondary | ICD-10-CM

## 2016-05-16 LAB — COAGUCHEK XS/INR WAIVED
INR: 4.3 — ABNORMAL HIGH (ref 0.9–1.1)
PROTHROMBIN TIME: 52.1 s

## 2016-05-16 NOTE — Progress Notes (Signed)
BP 117/69 (BP Location: Left Arm, Patient Position: Sitting, Cuff Size: Large)   Pulse 62   Temp 97.4 F (36.3 C)   Wt (!) 327 lb (148.3 kg)   SpO2 97%   BMI 52.78 kg/m    Subjective:    Patient ID: Roberto [name], male    DOB: 01-Aug-1955, 60 y.o.   MRN: [id number]  CC: Coumadin management  HPI: This patient is a 60 y.o. male who presents for coumadin management. The expected duration of coumadin treatment is lifelong The reason for anticoagulation is  mechanical heart valve.  Present Coumadin dose: Goal: 2.5-3.5  Excessive bruising: no Nose bleeding: no Rectal bleeding: no Prolonged menstrual cycles: N/A Eating diet with consistent amounts of foods containing Vitamin K:yes Any recent antibiotic use? no  Relevant past medical, surgical, family and social history reviewed and updated as indicated. Interim medical history since our last visit reviewed. Allergies and medications reviewed and updated.  ROS: Per HPI unless specifically indicated above     Objective:    BP 117/69 (BP Location: Left Arm, Patient Position: Sitting, Cuff Size: Large)   Pulse 62   Temp 97.4 F (36.3 C)   Wt (!) 327 lb (148.3 kg)   SpO2 97%   BMI 52.78 kg/m   Wt Readings from Last 3 Encounters:  05/16/16 (!) 327 lb (148.3 kg)  05/12/16 (!) 327 lb 12.8 oz (148.7 kg)  05/08/16 (!) 337 lb (152.9 kg)     General: Well appearing, well nourished in no distress.  Normal mood and affect. Skin: No excessive bruising or rash  Last INR: 4.3 Last PT: 52.1    Last CBC:  Lab Results  Component Value Date   WBC 10.6 05/12/2016   HGB 13.7 05/06/2016   HCT 43.8 05/12/2016   MCV 84 05/12/2016   PLT 414 (H) 05/12/2016    Results for orders placed or performed in visit on 05/12/16  CoaguChek XS/INR Waived  Result Value Ref Range   INR 5.2 (H) 0.9 - 1.1   Prothrombin Time 62.5 sec  CBC with Differential/Platelet  Result Value Ref Range   WBC 10.6 3.4 - 10.8 x10E3/uL   RBC 5.24 4.14 - 5.80  x10E6/uL   Hemoglobin 14.9 12.6 - 17.7 g/dL   Hematocrit 43.8 37.5 - 51.0 %   MCV 84 79 - 97 fL   MCH 28.4 26.6 - 33.0 pg   MCHC 34.0 31.5 - 35.7 g/dL   RDW 14.1 12.3 - 15.4 %   Platelets 414 (H) 150 - 379 x10E3/uL   Neutrophils 60 Not Estab. %   Lymphs 25 Not Estab. %   Monocytes 10 Not Estab. %   Eos 2 Not Estab. %   Basos 1 Not Estab. %   Neutrophils Absolute 6.6 1.4 - 7.0 x10E3/uL   Lymphocytes Absolute 2.7 0.7 - 3.1 x10E3/uL   Monocytes Absolute 1.1 (H) 0.1 - 0.9 x10E3/uL   EOS (ABSOLUTE) 0.2 0.0 - 0.4 x10E3/uL   Basophils Absolute 0.1 0.0 - 0.2 x10E3/uL   Immature Granulocytes 2 Not Estab. %   Immature Grans (Abs) 0.2 (H) 0.0 - 0.1 x10E3/uL  Comprehensive metabolic panel  Result Value Ref Range   Glucose 92 65 - 99 mg/dL   BUN 13 8 - 27 mg/dL   Creatinine, Ser 1.23 0.76 - 1.27 mg/dL   GFR calc non Af Amer 63 >59 mL/min/1.73   GFR calc Af Amer 73 >59 mL/min/1.73   BUN/Creatinine Ratio 11 10 - 24   Sodium  140 134 - 144 mmol/L   Potassium 4.3 3.5 - 5.2 mmol/L   Chloride 98 96 - 106 mmol/L   CO2 25 18 - 29 mmol/L   Calcium 9.0 8.6 - 10.2 mg/dL   Total Protein 7.3 6.0 - 8.5 g/dL   Albumin 4.1 3.6 - 4.8 g/dL   Globulin, Total 3.2 1.5 - 4.5 g/dL   Albumin/Globulin Ratio 1.3 1.2 - 2.2   Bilirubin Total 0.3 0.0 - 1.2 mg/dL   Alkaline Phosphatase 65 39 - 117 IU/L   AST 27 0 - 40 IU/L   ALT 27 0 - 44 IU/L       Assessment:     ICD-9-CM ICD-10-CM   1. Mechanical heart valve present V43.3 Z95.2 CoaguChek XS/INR Waived  2. Supratherapeutic INR 790.92 R79.1    Continue current dose of coumadin, call with any bleeding. Will see if we can get INR drawn at hematology on Thursday.    Plan:   Discussed current plan face-to-face with patient. For coumadin dosing, elected to continue current dose for today. Will plan to recheck INR in Thursday.

## 2016-05-16 NOTE — Telephone Encounter (Signed)
Yes- please have him drop off the form

## 2016-05-16 NOTE — Telephone Encounter (Signed)
Pt wants to know if he can get a handicap placard for his car. Please call pt and advise. Thanks.

## 2016-05-16 NOTE — Telephone Encounter (Signed)
Left message for patient to drop form off.

## 2016-05-16 NOTE — Telephone Encounter (Signed)
Is this something that can be done?

## 2016-05-17 ENCOUNTER — Telehealth: Payer: Self-pay | Admitting: Family Medicine

## 2016-05-17 ENCOUNTER — Other Ambulatory Visit: Payer: Self-pay | Admitting: Family Medicine

## 2016-05-17 MED ORDER — NYSTATIN 100000 UNIT/ML MT SUSP: 5.0000 mL | Freq: Four times a day (QID) | OROMUCOSAL | 1 refills | Status: DC

## 2016-05-17 NOTE — Telephone Encounter (Signed)
Ms [name] would like to know if he could have more medication for thrush sent to [facility] [place].

## 2016-05-17 NOTE — Telephone Encounter (Signed)
Rx sent to his pharmacy

## 2016-05-18 ENCOUNTER — Encounter: Payer: Self-pay | Admitting: Hematology and Oncology

## 2016-05-18 ENCOUNTER — Telehealth: Payer: Self-pay | Admitting: *Deleted

## 2016-05-18 ENCOUNTER — Inpatient Hospital Stay: Payer: Medicare Other

## 2016-05-18 ENCOUNTER — Inpatient Hospital Stay: Payer: Medicare Other | Attending: Hematology and Oncology | Admitting: Hematology and Oncology

## 2016-05-18 DIAGNOSIS — Z7901 Long term (current) use of anticoagulants: Secondary | ICD-10-CM | POA: Diagnosis not present

## 2016-05-18 DIAGNOSIS — N280 Ischemia and infarction of kidney: Secondary | ICD-10-CM | POA: Insufficient documentation

## 2016-05-18 DIAGNOSIS — Z8673 Personal history of transient ischemic attack (TIA), and cerebral infarction without residual deficits: Secondary | ICD-10-CM | POA: Diagnosis not present

## 2016-05-18 DIAGNOSIS — E7800 Pure hypercholesterolemia, unspecified: Secondary | ICD-10-CM

## 2016-05-18 DIAGNOSIS — Z87891 Personal history of nicotine dependence: Secondary | ICD-10-CM | POA: Diagnosis not present

## 2016-05-18 DIAGNOSIS — I509 Heart failure, unspecified: Secondary | ICD-10-CM | POA: Insufficient documentation

## 2016-05-18 DIAGNOSIS — I701 Atherosclerosis of renal artery: Secondary | ICD-10-CM

## 2016-05-18 DIAGNOSIS — Z952 Presence of prosthetic heart valve: Secondary | ICD-10-CM | POA: Diagnosis not present

## 2016-05-18 LAB — PROTIME-INR
INR: 2.95
Prothrombin Time: 31.4 seconds — ABNORMAL HIGH (ref 11.4–15.2)

## 2016-05-18 NOTE — Progress Notes (Addendum)
[facility] day:  05/18/2016  Chief Complaint: Roberto [name] is a 60 y.o. 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 Park Liter in consultation for assessment and management.  HPI: The patient states that his aortic valve replaced 8-9 years ago in [place]. He has maintained an INR between 2.5-3.5. He has been on a stable dose of Coumadin for 8 year (4 mg, 4 mg, then 7.5 mg then repeat).  Until recent events, he states that he has not been off his Coumadin, alhough he thinks he had a colonoscopy about 5 years ago, but does not really remember the details  He was scheduled to have a colonoscopy given his family history of colon cancer. Coumadin was discontinued 5 days prior to his scheduled colonoscopy.  He was not bridged with Lovenox.  On the fourth day without Coumadin, he woke up and was unable to get out of bed because of severe right-sided flank pain. He presented to the emergency room.  He was admitted to [facility] from 05/01/2016 - 05/06/2016 with flank pain.  INR on admission was 1.11.  He was placed on heparin and restarted on Coumadin.  CT angiogram of the chest, abdomen and pelvis on 05/01/2016 revealed multiple right renal infarcts.  There was calcified aortic and renal artery atherosclerosis.  He has a prosthetic aortic valve.  There was stable fusiform enlargement of the ascending aorta since the chest CT on 03/14/2016. There was no aortic dissection.  Symptomatically, he denies any B symptoms.  He lost 20 pounds in 10 days while in the hospital and when he had flank pain. He only notes a little pressure on the right side now.  He denies any bruising or bleeding.  There is no family history of thrombosis or early pregnancy loss.   Past Medical History:  Diagnosis Date  . Cellulitis   . CHF (congestive heart failure) ([place])   . Clotting disorder ([place])   . H/O mechanical aortic valve replacement   . Hypercholesterolemia    . Stroke ([place])   . TIA (transient ischemic attack) 05/2014    Past Surgical History:  Procedure Laterality Date  . AORTIC VALVE REPLACEMENT    . CARDIAC SURGERY  2009   CHF  . CARPAL TUNNEL RELEASE Left 2005  . TONSILLECTOMY  1962    Family History  Problem Relation Age of Onset  . Arthritis Mother   . Cancer Mother   . Arthritis Father   . Diabetes Father   . Stroke Father   . Cancer Father   . Cancer Brother   . Cancer Sister     Social History:  reports that he quit smoking about 5 months ago. His smoking use included Cigarettes. He has a 80.00 pack-year smoking history. He has never used smokeless tobacco. He reports that he does not drink alcohol or use drugs.  He denies any exposure to radiation or toxins.  He is a Administrator. 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  Allergies: No Known Allergies  Current Medications: Current Outpatient Prescriptions  Medication Sig Dispense Refill  . albuterol (PROVENTIL HFA;VENTOLIN HFA) 108 (90 Base) MCG/ACT inhaler Inhale 2 puffs into the lungs every 6 (six) hours as needed for wheezing or shortness of breath. 1 Inhaler 0  . aspirin 81 MG chewable tablet Chew 81 mg by mouth daily.      . Fluticasone-Salmeterol (ADVAIR) 250-50 MCG/DOSE AEPB Inhale 1 puff into the lungs 2 (two) times daily.    Marland Kitchen  furosemide (LASIX) 40 MG tablet Take 1 tablet (40 mg total) by mouth daily. 90 tablet 1  . lisinopril (PRINIVIL,ZESTRIL) 2.5 MG tablet Take 1 tablet (2.5 mg total) by mouth daily. (Patient taking differently: Take 2.5 mg by mouth at bedtime. ) 90 tablet 1  . metoprolol tartrate (LOPRESSOR) 25 MG tablet Take 1 tablet (25 mg total) by mouth 2 (two) times daily. 180 tablet 1  . simvastatin (ZOCOR) 40 MG tablet TAKE 1 TABLET (40 MG TOTAL) BY MOUTH AT BEDTIME. 90 tablet 1  . tiotropium (SPIRIVA) 18 MCG inhalation capsule Place 18 mcg into inhaler and inhale daily.    Marland Kitchen warfarin (COUMADIN) 4 MG tablet Take 1 tablet (4 mg total) by mouth daily. (Patient  taking differently: Take 4 mg by mouth at bedtime. ) 90 tablet 6  . warfarin (COUMADIN) 7.5 MG tablet Take 1 tablet (7.5 mg total) by mouth daily. (Patient taking differently: Take 7.5 mg by mouth at bedtime. ) 240 tablet 1   No current facility-administered medications for this visit.     Review of Systems:  GENERAL:  Feels good.  Active.  No fevers or sweats.  Weight loss of 20 pounds while hospitalized and with right sided flank pain. PERFORMANCE STATUS (ECOG):  0 HEENT:  No visual changes, runny nose, sore throat, mouth sores or tenderness. Lungs: No shortness of breath or cough.  No hemoptysis. Cardiac:  No chest pain, palpitations, orthopnea, or PND. GI:  No nausea, vomiting, diarrhea, constipation, melena or hematochezia. GU:  No urgency, frequency, dysuria, or hematuria.  Unknown PSA. Musculoskeletal:  No back pain.  No joint pain.  No muscle tenderness. Extremities:  No pain or swelling. Skin:  No rashes or skin changes. Neuro:  No headache, numbness or weakness, balance or coordination issues. Endocrine:  No diabetes, thyroid issues, hot flashes or night sweats. Psych:  No mood changes, depression or anxiety. Pain:  No focal pain. Review of systems:  All other systems reviewed and found to be negative.  Physical Exam: Blood pressure 116/75, pulse 69, temperature (!) 95.3 F (35.2 C), temperature source Tympanic, height 5' 5.75" (1.67 m), weight (!) 328 lb 11.3 oz (149.1 kg). GENERAL:  Well developed, well nourished, heavyset gentleman sitting comfortably in the exam room in no acute distress. MENTAL STATUS:  Alert and oriented to person, place and time. HEAD:  Wearing a cap.  Graying hair.  Mustache.  Normocephalic, atraumatic, face symmetric, no Cushingoid features. EYES:  Brown eyes.  Pupils equal round and reactive to light and accomodation.  No conjunctivitis or scleral icterus. ENT:  Oropharynx clear without lesion.  Tongue normal. Mucous membranes moist.  RESPIRATORY:   Clear to auscultation without rales, wheezes or rhonchi. CARDIOVASCULAR:  Aortic valve mechanical click.  Regular rate and rhythm without murmur, rub or gallop. ABDOMEN:  Soft, non-tender, with active bowel sounds, and no appreciable hepatosplenomegaly.  No masses. BACK:  No flank pain. SKIN:  No rashes, ulcers or lesions. EXTREMITIES: No edema, no skin discoloration or tenderness.  No palpable cords. LYMPH NODES: No palpable cervical, supraclavicular, axillary or inguinal adenopathy  NEUROLOGICAL: Unremarkable. PSYCH:  Appropriate.  Office Visit on 05/16/2016  Component Date Value Ref Range Status  . INR 05/16/2016 4.3* 0.9 - 1.1 Final  . Prothrombin Time 05/16/2016 52.1  sec Final   Comment: Differences in reagents, instruments, and pre-analytical variables can affect prothrombin time results.  These factors should be considered when comparing different prothrombin time test methods. Please Note: This test should not be  used to monitor persons on heparin therapy.     Assessment:  Roberto [name] is a 60 y.o. male s/p mechanical aortic valve replacement with multiple right sided renal infarcts after discontinuation of Coumadin x 4 days in preparation for a colonoscopy.  INR was 1.11.  His aortic valve was replaced 8-9 years ago in [place], [place].  INR goal is 2-3.5. He has been on Coumadin x 8 year (4 mg, 4 mg, 7.5 mg then repeat).   CT angiogram of the chest, abdomen and pelvis on 05/01/2016 revealed multiple right renal infarcts.  There was calcified aortic and renal artery atherosclerosis.  He has a prosthetic aortic valve.  There was stable fusiform enlargement of the ascending aorta since the chest CT on 03/14/2016. There was no aortic dissection.  Symptomatically, he denies any B symptoms.  He lost 20 pounds in 10 days while in the hospital and when he had flank pain.  He denies any bruising or bleeding.  There is no family history of thrombosis.  Symptomatically, he  notes a little right sided flank pressure.  Exam is unremarkable.  Plan: 1.  Discuss right renal infarct likely secondary to sub-therapeutic INR.  Discuss limited hypercoagulable work-up.  Discuss need for Lovenox bridging for any procedure given the risk for embolic phenomenon off of mechanical valve.  Discuss consideration of echocardiogram. 2.  Labs today:  PT/INR, lupus anticoagulant panel, anticardiolipin antibodies, beta2-glycoprotein antibodies, Factor V Leiden, and prothrombin gene mutation. 3.  RTC in 2 weeks for review of testing.   Lequita Asal, MD  05/18/2016, 10:42 AM

## 2016-05-18 NOTE — Telephone Encounter (Signed)
-----   Message from Lequita Asal, MD sent at 05/18/2016  3:39 PM EST ----- Regarding: Please notify patient and PCP of INR   Thanks   ----- Message ----- From: Interface, Lab In [place] Sent: 05/18/2016  12:50 PM To: Lequita Asal, MD

## 2016-05-18 NOTE — Telephone Encounter (Signed)
Attempted to call patient regarding PT/INR.  No answer and no answering machine. Routed results to PCP.

## 2016-05-18 NOTE — Progress Notes (Signed)
Patient here today as new evaluation regarding renal infarct.  Referred by Dr. Park Liter.  Patient has history of aortic valve replacement.  Had a TIA 2 years ago.  Borderline diabetic.  CHF.  Patient brought prescription with him today from Park Liter to have PT/INR drawn.

## 2016-05-19 ENCOUNTER — Telehealth: Payer: Self-pay | Admitting: Family Medicine

## 2016-05-19 LAB — BETA-2-GLYCOPROTEIN I ABS, IGG/M/A
Beta-2 Glyco I IgG: <9 GPI IgG units (ref 0–20)
Beta-2-Glycoprotein I IgA: <9 GPI IgA units (ref 0–25)
Beta-2-Glycoprotein I IgM: <9 GPI IgM units (ref 0–32)

## 2016-05-19 NOTE — Telephone Encounter (Signed)
That's fine as long as they're OK with that. Thanks!

## 2016-05-19 NOTE — Telephone Encounter (Signed)
Patient's girlfriend notified.  Dr.[name] he is scheduled to come in on Wednesday for an INR with Apolonio Schneiders, is it ok for him to wait an extra day and just see her on the 22nd?

## 2016-05-19 NOTE — Telephone Encounter (Addendum)
Please let them know that his INR came back perfect! 2.95. He should stay on his current dose (4 daily) and I'd like to see them Tuesday to recheck it and if it's good he'll be able to go a week then. Thanks!

## 2016-05-19 NOTE — Telephone Encounter (Signed)
Roberto [name] called and wanted INR results from the cancer center.

## 2016-05-20 ENCOUNTER — Encounter: Payer: Self-pay | Admitting: Hematology and Oncology

## 2016-05-20 LAB — CARDIOLIPIN ANTIBODIES, IGG, IGM, IGA
Anticardiolipin IgA: <9 APL U/mL (ref 0–11)
Anticardiolipin IgG: <9 GPL U/mL (ref 0–14)
Anticardiolipin IgM: <9 MPL U/mL (ref 0–12)

## 2016-05-21 LAB — LUPUS ANTICOAGULANT PANEL
DRVVT: 53.4 s — ABNORMAL HIGH (ref 0.0–47.0)
PTT Lupus Anticoagulant: 48.4 s (ref 0.0–51.9)

## 2016-05-21 LAB — DRVVT MIX: dRVVT Mix: 37.3 s (ref 0.0–47.0)

## 2016-05-23 ENCOUNTER — Telehealth: Payer: Self-pay | Admitting: Family Medicine

## 2016-05-23 ENCOUNTER — Encounter: Payer: Self-pay | Admitting: Family Medicine

## 2016-05-23 ENCOUNTER — Ambulatory Visit (INDEPENDENT_AMBULATORY_CARE_PROVIDER_SITE_OTHER): Payer: Medicare Other | Admitting: Family Medicine

## 2016-05-23 ENCOUNTER — Encounter: Payer: Medicare Other | Attending: Family Medicine | Admitting: Dietician

## 2016-05-23 ENCOUNTER — Encounter: Payer: Self-pay | Admitting: Dietician

## 2016-05-23 VITALS — BP 98/72 | Ht 66.0 in | Wt 327.1 lb

## 2016-05-23 VITALS — BP 109/74 | HR 61 | Temp 98.2°F | Wt 330.6 lb

## 2016-05-23 DIAGNOSIS — Z713 Dietary counseling and surveillance: Secondary | ICD-10-CM | POA: Insufficient documentation

## 2016-05-23 DIAGNOSIS — E119 Type 2 diabetes mellitus without complications: Secondary | ICD-10-CM | POA: Diagnosis not present

## 2016-05-23 DIAGNOSIS — Z952 Presence of prosthetic heart valve: Secondary | ICD-10-CM | POA: Diagnosis not present

## 2016-05-23 LAB — FACTOR 5 LEIDEN

## 2016-05-23 LAB — COAGUCHEK XS/INR WAIVED
INR: 3 — AB (ref 0.9–1.1)
PROTHROMBIN TIME: 36.2 s

## 2016-05-23 LAB — PROTHROMBIN GENE MUTATION

## 2016-05-23 NOTE — Progress Notes (Signed)
Diabetes Self-Management Education  Visit Type: First/Initial  Appt. Start Time: 1030 Appt. End Time: 1140  05/23/2016  [name], identified by name and date of birth, is a 60 y.o. male with a diagnosis of Diabetes: Type 2.   ASSESSMENT  Blood pressure 98/72, height 5\' 6"  (1.676 m), weight (!) 327 lb 1.6 oz (148.4 kg). Body mass index is 52.8 kg/m.  Obesity      Diabetes Self-Management Education - 05/23/16 1258      Visit Information   Visit Type First/Initial     Initial Visit   Diabetes Type Type 2     Health Coping   How would you rate your overall health? Poor     Psychosocial Assessment   Patient Belief/Attitude about Diabetes Motivated to manage diabetes   Self-care barriers None   Other persons present Spouse/SO   Patient Concerns Glycemic Control;Weight Control;Healthy Lifestyle   Special Needs None   Preferred Learning Style Visual;Auditory   Learning Readiness Ready   What is the last grade level you completed in school? 15     Pre-Education Assessment   Patient understands the diabetes disease and treatment process. Needs Instruction   Patient understands incorporating nutritional management into lifestyle. Needs Instruction   Patient undertands incorporating physical activity into lifestyle. Needs Instruction   Patient understands using medications safely. Needs Instruction   Patient understands monitoring blood glucose, interpreting and using results Needs Instruction   Patient understands prevention, detection, and treatment of acute complications. Needs Instruction   Patient understands prevention, detection, and treatment of chronic complications. Needs Instruction   Patient understands how to develop strategies to address psychosocial issues. Needs Instruction   Patient understands how to develop strategies to promote health/change behavior. Needs Instruction     Complications   Last HgB A1C per patient/outside source 6.6 %  04-2016   How often do you check your blood sugar? 0 times/day (not testing)   Have you had a dilated eye exam in the past 12 months? No  2005-has eye appt 06-01-16   Have you had a dental exam in the past 12 months? No  about 15 years ago   Are you checking your feet? No     Dietary Intake   Breakfast --  meal time varies 10a-2p-often skips (eats 4 eggs, sausage or bacon and toast or bagel)   Snack (morning) --  none   Lunch --  eats sandwich but occasionally skips-eats 1-2 meals/day   Snack (afternoon) --  eats fruit for snack   Dinner --  eats at 6p-eats fried foods and sweets 2-3x/wk    Snack (evening) --  eats occasional fruit   Beverage(s) --  drinks water 8+x/day + black coffee & diet sodas 8+x/day and occasional regular soda-pt was drinking regular Sprite during visit; milk 2x/day     Exercise   Exercise Type --  treadmill or bike 10-15 min. 3x/wk.   How many days per week to you exercise? 3     Patient Education   Previous Diabetes Education No   Disease state  Definition of diabetes, type 1 and 2, and the diagnosis of diabetes;Factors that contribute to the development of diabetes;Explored patient's options for treatment of their diabetes   Nutrition management  Role of diet in the treatment of diabetes and the relationship between the three main macronutrients and blood glucose level;Food label reading, portion sizes and measuring food.;Carbohydrate counting   Physical activity and exercise  Role of exercise on diabetes management,  blood pressure control and cardiac health.;Helped patient identify appropriate exercises in relation to his/her diabetes, diabetes complications and other health issue.   Monitoring Taught/evaluated SMBG meter.;Taught/discussed recording of test results and interpretation of SMBG.;Identified appropriate SMBG and/or A1C goals.;Daily foot exams;Purpose and frequency of SMBG.;Yearly dilated eye exam  gave pt Contour Next EZ meter and instructed on its  use-BG 141 after drinking Sprite   Chronic complications Relationship between chronic complications and blood glucose control;Assessed and discussed foot care and prevention of foot problems;Dental care;Retinopathy and reason for yearly dilated eye exams;Identified and discussed with patient  current chronic complications;Reviewed with patient heart disease, higher risk of, and prevention   Psychosocial adjustment Role of stress on diabetes   Personal strategies to promote health Lifestyle issues that need to be addressed for better diabetes care;Helped patient develop diabetes management plan for (enter comment)      Individualized Plan for Diabetes Self-Management Training:   Learning Objective:  Patient will have a greater understanding of diabetes self-management. Patient education plan is to attend individual and/or group sessions per assessed needs and concerns.   Plan:   Patient Instructions   Check blood sugars 2 x day before breakfast and 2 hrs after supper every day and record Exercise: Continue treadmill or bike 10-15 min. 4x/wk. as tolerated   Avoid sugar sweetened drinks (soda, tea, coffee, sports drinks, juices) Limit intake of sweets and fried foods Eat 3 meals day,   1  snacks a day a bedtime Eat 3 carbohydrate servings/meal + protein Eat 1 carbohydrate serving/snack + protein Space meals 4-6 hours apart Make a  dentist  appointment Check feet daily Bring blood sugar records to the next appointment/class Call your doctor for a prescription for:  1. Meter strips (type) Contour Next test strips  checking  2 times per day  2. Lancets (type) Microlet lancets checking  2  times per day Get a Sharps container Return for appointment/classes on:  06-15-16   Expected Outcomes:   positive  Education material provided: [employer] Guidelines, Contour Next EZ meter  If problems or questions, patient to contact team via:  [phone]  Future DSME appointment:   06-15-16

## 2016-05-23 NOTE — Telephone Encounter (Signed)
Prescription written, will fax.

## 2016-05-23 NOTE — Patient Instructions (Addendum)
  Check blood sugars 2 x day before breakfast and 2 hrs after supper every day and record Exercise: Continue treadmill or bike 10-15 min. 4x/wk. as tolerated   Avoid sugar sweetened drinks (soda, tea, coffee, sports drinks, juices) Limit intake of sweets and fried foods Eat 3 meals day,   1  snacks a day a bedtime Eat 3 carbohydrate servings/meal + protein Eat 1 carbohydrate serving/snack + protein Space meals 4-6 hours apart Make a  dentist  appointment Check feet daily Bring blood sugar records to the next appointment/class Call your doctor for a prescription for:  1. Meter strips (type) Contour Next test strips  checking  2 times per day  2. Lancets (type) Microlet lancets checking  2  times per day Get a Sharps container Return for appointment/classes on:  06-15-16

## 2016-05-23 NOTE — Progress Notes (Signed)
   BP 109/74 (BP Location: Left Arm, Patient Position: Sitting, Cuff Size: Large)   Pulse 61   Temp 98.2 F (36.8 C)   Wt (!) 330 lb 9.6 oz (150 kg)   SpO2 96%   BMI 53.77 kg/m    Subjective:    Patient ID: Roberto [name], male    DOB: 04-11-1956, 60 y.o.   MRN: [id number]  CC: Coumadin management  HPI: This patient is a 60 y.o. male who presents for coumadin management. The expected duration of coumadin treatment is lifelong The reason for anticoagulation is  mechanical heart valve.  Present Coumadin dose: 4mg  daily Goal: 2.5-3.5  Excessive bruising: no Nose bleeding: no Rectal bleeding: no Prolonged menstrual cycles: N/A Eating diet with consistent amounts of foods containing Vitamin K:yes Any recent antibiotic use? no  Relevant past medical, surgical, family and social history reviewed and updated as indicated. Interim medical history since our last visit reviewed. Allergies and medications reviewed and updated.  ROS: Per HPI unless specifically indicated above     Objective:    BP 109/74 (BP Location: Left Arm, Patient Position: Sitting, Cuff Size: Large)   Pulse 61   Temp 98.2 F (36.8 C)   Wt (!) 330 lb 9.6 oz (150 kg)   SpO2 96%   BMI 53.77 kg/m   Wt Readings from Last 3 Encounters:  05/23/16 (!) 330 lb 9.6 oz (150 kg)  05/18/16 (!) 328 lb 11.3 oz (149.1 kg)  05/16/16 (!) 327 lb (148.3 kg)     General: Well appearing, well nourished in no distress.  Normal mood and affect. Skin: No excessive bruising or rash  Last INR: 3.0 Last PT: 36.2    Last CBC:  Lab Results  Component Value Date   WBC 10.6 05/12/2016   HGB 13.7 05/06/2016   HCT 43.8 05/12/2016   MCV 84 05/12/2016   PLT 414 (H) 05/12/2016    Results for orders placed or performed in visit on 05/18/16  Lupus anticoagulant panel  Result Value Ref Range   PTT Lupus Anticoagulant 48.4 0.0 - 51.9 sec   DRVVT 53.4 (H) 0.0 - 47.0 sec   Lupus Anticoag Interp Comment:   Cardiolipin antibodies,  IgG, IgM, IgA  Result Value Ref Range   Anticardiolipin IgG <9 0 - 14 GPL U/mL   Anticardiolipin IgM <9 0 - 12 MPL U/mL   Anticardiolipin IgA <9 0 - 11 APL U/mL  Beta-2-glycoprotein i abs, IgG/M/A  Result Value Ref Range   Beta-2 Glyco I IgG <9 0 - 20 GPI IgG units   Beta-2-Glycoprotein I IgM <9 0 - 32 GPI IgM units   Beta-2-Glycoprotein I IgA <9 0 - 25 GPI IgA units  Protime-INR  Result Value Ref Range   Prothrombin Time 31.4 (H) 11.4 - 15.2 seconds   INR 2.95   dRVVT Mix  Result Value Ref Range   dRVVT Mix 37.3 0.0 - 47.0 sec       Assessment:     ICD-9-CM ICD-10-CM   1. Mechanical heart valve present V43.3 Z95.2 CoaguChek XS/INR Waived     ECHOCARDIOGRAM COMPLETE    Plan:   Discussed current plan face-to-face with patient. For coumadin dosing, elected to continue current dose. Will plan to recheck INR in 1 week.

## 2016-05-23 NOTE — Telephone Encounter (Signed)
Pt's girlfriend called stated pt needs an RX for lancets and test strips for Contour Next. Pt needs 60 per month. Needs this ASAP. Pharm is Paediatric nurse on [address] Thanks.

## 2016-05-24 ENCOUNTER — Ambulatory Visit: Payer: Medicare Other | Admitting: Family Medicine

## 2016-05-24 ENCOUNTER — Telehealth: Payer: Self-pay | Admitting: Family Medicine

## 2016-05-24 DIAGNOSIS — N280 Ischemia and infarction of kidney: Secondary | ICD-10-CM | POA: Diagnosis not present

## 2016-05-24 DIAGNOSIS — N179 Acute kidney failure, unspecified: Secondary | ICD-10-CM | POA: Diagnosis not present

## 2016-05-24 DIAGNOSIS — I10 Essential (primary) hypertension: Secondary | ICD-10-CM | POA: Diagnosis not present

## 2016-05-24 NOTE — Telephone Encounter (Signed)
Pt's girlfriend called stated the RX for pt's lancets and strips need to be sent again.  The RX must have the provider's signature has to have the date beside of it.  The provider's NPI number must be on the RX as well. Please send immediately. Pt is completely out. Pt's girlfriend is going back to [facility] to pick this up in an hour and demands this gets done correctly NOW!

## 2016-05-26 NOTE — Telephone Encounter (Signed)
Can you make sure they got this?

## 2016-05-26 NOTE — Telephone Encounter (Signed)
Patient did get his supplies.

## 2016-05-29 ENCOUNTER — Other Ambulatory Visit: Payer: Self-pay | Admitting: Family Medicine

## 2016-05-29 ENCOUNTER — Ambulatory Visit (INDEPENDENT_AMBULATORY_CARE_PROVIDER_SITE_OTHER): Payer: Medicare Other | Admitting: Family Medicine

## 2016-05-29 ENCOUNTER — Encounter: Payer: Self-pay | Admitting: Family Medicine

## 2016-05-29 DIAGNOSIS — Z952 Presence of prosthetic heart valve: Secondary | ICD-10-CM | POA: Diagnosis not present

## 2016-05-29 LAB — COAGUCHEK XS/INR WAIVED
INR: 2.3 — AB (ref 0.9–1.1)
Prothrombin Time: 28.8 s

## 2016-05-29 NOTE — Progress Notes (Signed)
   BP 107/70 (BP Location: Left Arm, Patient Position: Sitting, Cuff Size: Large)   Pulse 68   Temp 97.7 F (36.5 C)   Wt (!) 329 lb 4.8 oz (149.4 kg)   SpO2 97%   BMI 53.15 kg/m    Subjective:    Patient ID: Roberto [name], male    DOB: 1955/09/16, 60 y.o.   MRN: [id number]  CC: Coumadin management  HPI: This patient is a 60 y.o. male who presents for coumadin management. The expected duration of coumadin treatment is lifelong The reason for anticoagulation is  mechanical heart valve.  Present Coumadin dose: 4mg  daily Goal: 2.5-3.5  Excessive bruising: no Nose bleeding: no Rectal bleeding: no Prolonged menstrual cycles: N/A Eating diet with consistent amounts of foods containing Vitamin K:yes Any recent antibiotic use? no  Relevant past medical, surgical, family and social history reviewed and updated as indicated. Interim medical history since our last visit reviewed. Allergies and medications reviewed and updated.  ROS: Per HPI unless specifically indicated above     Objective:    BP 107/70 (BP Location: Left Arm, Patient Position: Sitting, Cuff Size: Large)   Pulse 68   Temp 97.7 F (36.5 C)   Wt (!) 329 lb 4.8 oz (149.4 kg)   SpO2 97%   BMI 53.15 kg/m   Wt Readings from Last 3 Encounters:  05/29/16 (!) 329 lb 4.8 oz (149.4 kg)  05/23/16 (!) 327 lb 1.6 oz (148.4 kg)  05/23/16 (!) 330 lb 9.6 oz (150 kg)     General: Well appearing, well nourished in no distress.  Normal mood and affect. Skin: No excessive bruising or rash  Last INR: 2.3 Last PT: 28.8    Last CBC:  Lab Results  Component Value Date   WBC 10.6 05/12/2016   HGB 13.7 05/06/2016   HCT 43.8 05/12/2016   MCV 84 05/12/2016   PLT 414 (H) 05/12/2016    Results for orders placed or performed in visit on 05/23/16  CoaguChek XS/INR Waived  Result Value Ref Range   INR 3.0 (H) 0.9 - 1.1   Prothrombin Time 36.2 sec       Assessment:     ICD-9-CM ICD-10-CM   1. Mechanical heart valve  present V43.3 Z95.2 CoaguChek XS/INR Waived    Plan:   Discussed current plan face-to-face with patient. For coumadin dosing, elected to change dose to 4mg , 4mg , 7mg . Will plan to recheck INR in 1 week.

## 2016-06-01 ENCOUNTER — Inpatient Hospital Stay (HOSPITAL_BASED_OUTPATIENT_CLINIC_OR_DEPARTMENT_OTHER): Payer: Medicare Other | Admitting: Hematology and Oncology

## 2016-06-01 ENCOUNTER — Telehealth: Payer: Self-pay | Admitting: Family Medicine

## 2016-06-01 VITALS — BP 100/69 | HR 83 | Temp 96.5°F | Resp 18 | Wt 332.9 lb

## 2016-06-01 DIAGNOSIS — E7800 Pure hypercholesterolemia, unspecified: Secondary | ICD-10-CM

## 2016-06-01 DIAGNOSIS — I701 Atherosclerosis of renal artery: Secondary | ICD-10-CM | POA: Diagnosis not present

## 2016-06-01 DIAGNOSIS — Z952 Presence of prosthetic heart valve: Secondary | ICD-10-CM | POA: Diagnosis not present

## 2016-06-01 DIAGNOSIS — Z8673 Personal history of transient ischemic attack (TIA), and cerebral infarction without residual deficits: Secondary | ICD-10-CM

## 2016-06-01 DIAGNOSIS — N280 Ischemia and infarction of kidney: Secondary | ICD-10-CM

## 2016-06-01 DIAGNOSIS — Z87891 Personal history of nicotine dependence: Secondary | ICD-10-CM

## 2016-06-01 DIAGNOSIS — I509 Heart failure, unspecified: Secondary | ICD-10-CM

## 2016-06-01 DIAGNOSIS — E119 Type 2 diabetes mellitus without complications: Secondary | ICD-10-CM | POA: Diagnosis not present

## 2016-06-01 DIAGNOSIS — Z7901 Long term (current) use of anticoagulants: Secondary | ICD-10-CM

## 2016-06-01 LAB — HM DIABETES EYE EXAM

## 2016-06-01 NOTE — Progress Notes (Signed)
Patient is taking Coumadin 4mg  for 2 days alternate with 7.5mg  1 day.

## 2016-06-01 NOTE — Telephone Encounter (Signed)
An ECHO is considered an order.  It does not hit the referral Workqueue.   Called Scheduling, they do not schedule Echo's for [place].   The number to call to schedule Echo's is [phone].  Called the number and nobody answered, left message [facility] specialty scheduling). Will try again after lunch 06/01/2016.

## 2016-06-01 NOTE — Progress Notes (Signed)
[facility] day:  06/01/2016  Chief Complaint: Roberto [name] is a 60 y.o. male with renal infarcts who is seen for review of work-up and discussion regarding direction of therapy.  HPI: The patient was last seen in the hematology clinic on 05/18/2016.  At that time, he was seen for initial consultation.  H was s/p mechanical aortic valve replacement with multiple right sided renal infarcts after discontinuation of Coumadin x 4 days in preparation for a colonoscopy.  INR was 1.11 at the time of his infarcts.  We discussed that his infarcts were likely secondary to a sub-therapeutic INR.  We discussed Lovenox bridging for any procedure given the risk for embolic phenomenon off of mechanical valve.  We discussed consideration of echocardiogram.  He underwent a limited hypercoagulable work-up.  INR was 2.95.  Lupus anticoagulant panel was negative.  Anti-cardiolipin antibodies were negative.  Beta-2 glycoprotein antibodies were negative.  Factor V Leiden and prothrombin gene mutation were negative.  Symptomatically, he denies any concerns.   Past Medical History:  Diagnosis Date  . Cellulitis   . CHF (congestive heart failure) ([place])   . Clotting disorder ([place])   . H/O mechanical aortic valve replacement   . Hypercholesterolemia   . Stroke ([place])   . TIA (transient ischemic attack) 05/2014    Past Surgical History:  Procedure Laterality Date  . AORTIC VALVE REPLACEMENT    . CARDIAC SURGERY  2009   CHF  . CARPAL TUNNEL RELEASE Left 2005  . TONSILLECTOMY  1962    Family History  Problem Relation Age of Onset  . Arthritis Mother   . Cancer Mother   . Arthritis Father   . Diabetes Father   . Stroke Father   . Cancer Father   . Cancer Brother   . Cancer Sister     Social History:  reports that he quit smoking about 5 months ago. His smoking use included Cigarettes. He has a 80.00 pack-year smoking history. He has never used smokeless  tobacco. He reports that he does not drink alcohol or use drugs.  He denies any exposure to radiation or toxins.  He is a Administrator. The patient is accompanied by his room-mate, [name], today.  Allergies: No Known Allergies  Current Medications: Current Outpatient Prescriptions  Medication Sig Dispense Refill  . albuterol (PROVENTIL HFA;VENTOLIN HFA) 108 (90 Base) MCG/ACT inhaler Inhale 2 puffs into the lungs every 6 (six) hours as needed for wheezing or shortness of breath. 1 Inhaler 0  . aspirin 81 MG chewable tablet Chew 81 mg by mouth daily.      . Fluticasone-Salmeterol (ADVAIR) 250-50 MCG/DOSE AEPB Inhale 1 puff into the lungs 2 (two) times daily.    . furosemide (LASIX) 40 MG tablet Take 1 tablet (40 mg total) by mouth daily. 90 tablet 1  . lisinopril (PRINIVIL,ZESTRIL) 2.5 MG tablet Take 1 tablet (2.5 mg total) by mouth daily. (Patient taking differently: Take 2.5 mg by mouth at bedtime. ) 90 tablet 1  . metoprolol tartrate (LOPRESSOR) 25 MG tablet Take 1 tablet (25 mg total) by mouth 2 (two) times daily. 180 tablet 1  . simvastatin (ZOCOR) 40 MG tablet TAKE 1 TABLET (40 MG TOTAL) BY MOUTH AT BEDTIME. 90 tablet 1  . tiotropium (SPIRIVA) 18 MCG inhalation capsule Place 18 mcg into inhaler and inhale daily.    Marland Kitchen warfarin (COUMADIN) 4 MG tablet Take 1 tablet (4 mg total) by mouth daily. (Patient taking differently: Take  4 mg by mouth at bedtime. ) 90 tablet 6  . warfarin (COUMADIN) 7.5 MG tablet Take 1 tablet (7.5 mg total) by mouth daily. 240 tablet 1   No current facility-administered medications for this visit.     Review of Systems:  GENERAL:  Feels good.  Active.  No fevers or sweats.  Weight loss of 20 pounds while hospitalized.  Weight up 4 pounds since last visit. PERFORMANCE STATUS (ECOG):  0 HEENT:  No visual changes, runny nose, sore throat, mouth sores or tenderness. Lungs: No shortness of breath or cough.  No hemoptysis. Cardiac:  No chest pain, palpitations, orthopnea, or  PND. GI:  No nausea, vomiting, diarrhea, constipation, melena or hematochezia. GU:  No urgency, frequency, dysuria, or hematuria.  Unknown PSA. Musculoskeletal:  No back pain.  No joint pain.  No muscle tenderness. Extremities:  No pain or swelling. Skin:  No rashes or skin changes. Neuro:  No headache, numbness or weakness, balance or coordination issues. Endocrine:  No diabetes, thyroid issues, hot flashes or night sweats. Psych:  No mood changes, depression or anxiety. Pain:  No focal pain. Review of systems:  All other systems reviewed and found to be negative.  Physical Exam: Blood pressure 100/69, pulse 83, temperature (!) 96.5 F (35.8 C), temperature source Tympanic, resp. rate 18, weight (!) 332 lb 14.3 oz (151 kg). GENERAL:  Well developed, well nourished, heavyset gentleman sitting comfortably in the exam room in no acute distress. MENTAL STATUS:  Alert and oriented to person, place and time. HEAD:  Wearing a cap.  Graying hair.  Mustache.  Normocephalic, atraumatic, face symmetric, no Cushingoid features. EYES:  Brown eyes.  No conjunctivitis or scleral icterus. NEUROLOGICAL: Unremarkable. PSYCH:  Appropriate.   No visits with results within 3 Day(s) from this visit.  Latest known visit with results is:  Office Visit on 05/29/2016  Component Date Value Ref Range Status  . INR 05/29/2016 2.3* 0.9 - 1.1 Final  . Prothrombin Time 05/29/2016 28.8  sec Final   Comment: Differences in reagents, instruments, and pre-analytical variables can affect prothrombin time results.  These factors should be considered when comparing different prothrombin time test methods. Please Note: This test should not be used to monitor persons on heparin therapy.     Assessment:  Roberto [name] is a 60 y.o. male s/p mechanical aortic valve replacement with multiple right sided renal infarcts after discontinuation of Coumadin x 4 days in preparation for a colonoscopy.  INR was 1.11.  His  aortic valve was replaced 8-9 years ago in [place], [place].  INR goal is 2-3.5. He has been on Coumadin x 8 year (4 mg, 4 mg, 7.5 mg then repeat).   CT angiogram of the chest, abdomen and pelvis on 05/01/2016 revealed multiple right renal infarcts.  There was calcified aortic and renal artery atherosclerosis.  He has a prosthetic aortic valve.  There was stable fusiform enlargement of the ascending aorta since the chest CT on 03/14/2016. There was no aortic dissection.  Hypercoagulable work-up on 05/18/2016 revealed the following normal studies:  lupus anticoagulant panel, anti-cardiolipin antibodies, beta-2 glycoprotein antibodies, factor V Leiden and prothrombin gene mutation.  Symptomatically, he denies any B symptoms.  He lost 20 pounds in 10 days while in the hospital when he had flank pain.  He has gained 4 pounds in 2 weeks.  He denies any bruising or bleeding.  There is no family history of thrombosis.  Plan: 1.  Discuss results of limited hypercoagulable work-up.  Discuss no abnormality found.  Discuss etiology of infarcts likely due to being off Coumadin and his prosthetic valve.  Review need for Lovenox bridging for any procedure given the risk for embolic phenomenon off of mechanical valve.  2.  Continue Coumadin. 3.  RTC prn.   Lequita Asal, MD  06/01/2016, 10:37 AM

## 2016-06-01 NOTE — Telephone Encounter (Signed)
Pt's girlfriend [name] would like to know the status of the patient's ECHO Cardiogram appt.  Caller states that [name] was seen by the oncologist today and would like the patient to have this test performed as soon as possible.

## 2016-06-01 NOTE — Telephone Encounter (Signed)
Echo has been scheduled.  06/05/2016 at 11:00 at [facility].   Patient's gf notified.

## 2016-06-05 ENCOUNTER — Ambulatory Visit
Admission: RE | Admit: 2016-06-05 | Discharge: 2016-06-05 | Disposition: A | Discharge status: Home / Self Care | Payer: Medicare Other | Source: Ambulatory Visit | Attending: Family Medicine | Admitting: Family Medicine

## 2016-06-05 ENCOUNTER — Encounter: Payer: Self-pay | Admitting: Family Medicine

## 2016-06-05 ENCOUNTER — Ambulatory Visit (INDEPENDENT_AMBULATORY_CARE_PROVIDER_SITE_OTHER): Payer: Medicare Other | Admitting: Family Medicine

## 2016-06-05 ENCOUNTER — Other Ambulatory Visit: Payer: Self-pay | Admitting: Family Medicine

## 2016-06-05 VITALS — BP 117/80 | HR 67 | Temp 97.9°F | Ht 65.2 in | Wt 332.0 lb

## 2016-06-05 DIAGNOSIS — I071 Rheumatic tricuspid insufficiency: Secondary | ICD-10-CM | POA: Insufficient documentation

## 2016-06-05 DIAGNOSIS — E7800 Pure hypercholesterolemia, unspecified: Secondary | ICD-10-CM | POA: Insufficient documentation

## 2016-06-05 DIAGNOSIS — I509 Heart failure, unspecified: Secondary | ICD-10-CM | POA: Insufficient documentation

## 2016-06-05 DIAGNOSIS — Z8673 Personal history of transient ischemic attack (TIA), and cerebral infarction without residual deficits: Secondary | ICD-10-CM | POA: Insufficient documentation

## 2016-06-05 DIAGNOSIS — I359 Nonrheumatic aortic valve disorder, unspecified: Secondary | ICD-10-CM | POA: Insufficient documentation

## 2016-06-05 DIAGNOSIS — Z952 Presence of prosthetic heart valve: Secondary | ICD-10-CM | POA: Diagnosis not present

## 2016-06-05 DIAGNOSIS — I340 Nonrheumatic mitral (valve) insufficiency: Secondary | ICD-10-CM | POA: Insufficient documentation

## 2016-06-05 DIAGNOSIS — D485 Neoplasm of uncertain behavior of skin: Secondary | ICD-10-CM | POA: Diagnosis not present

## 2016-06-05 LAB — ECHOCARDIOGRAM COMPLETE
Height: 65.2 in
Weight: 5312 oz

## 2016-06-05 LAB — COAGUCHEK XS/INR WAIVED
INR: 2.7 — AB (ref 0.9–1.1)
Prothrombin Time: 32.8 s

## 2016-06-05 NOTE — Progress Notes (Signed)
*  PRELIMINARY RESULTS* Echocardiogram 2D Echocardiogram has been performed.  Roberto [name] 06/05/2016, 12:01 PM

## 2016-06-05 NOTE — Progress Notes (Signed)
   BP 117/80 (BP Location: Left Arm, Patient Position: Sitting, Cuff Size: Large)   Pulse 67   Temp 97.9 F (36.6 C)   Ht 5' 5.2" (1.656 m)   Wt (!) 332 lb (150.6 kg)   BMI 54.91 kg/m    Subjective:    Patient ID: Roberto [name], male    DOB: [birth year], 60 y.o.   MRN: [id number]  CC: Coumadin management  HPI: This patient is a 60 y.o. male who presents for coumadin management. The expected duration of coumadin treatment is lifelong The reason for anticoagulation is  mechanical heart valve.  Present Coumadin dose: 7.5, 4mg , 4mg  alternating Goal: 2.5-3.5  Excessive bruising: no Nose bleeding: no Rectal bleeding: no Prolonged menstrual cycles: N/A Eating diet with consistent amounts of foods containing Vitamin K:yes Any recent antibiotic use? no  Relevant past medical, surgical, family and social history reviewed and updated as indicated. Interim medical history since our last visit reviewed. Allergies and medications reviewed and updated.  ROS: Per HPI unless specifically indicated above     Objective:    BP 117/80 (BP Location: Left Arm, Patient Position: Sitting, Cuff Size: Large)   Pulse 67   Temp 97.9 F (36.6 C)   Ht 5' 5.2" (1.656 m)   Wt (!) 332 lb (150.6 kg)   BMI 54.91 kg/m   Wt Readings from Last 3 Encounters:  06/05/16 (!) 332 lb (150.6 kg)  06/01/16 (!) 332 lb 14.3 oz (151 kg)  05/29/16 (!) 329 lb 4.8 oz (149.4 kg)     General: Well appearing, well nourished in no distress.  Normal mood and affect. Skin: No excessive bruising or rash, numerous moles on arms and back  Last INR: 2.7 PT: 32.8   Last CBC:  Lab Results  Component Value Date   WBC 10.6 05/12/2016   HGB 13.7 05/06/2016   HCT 43.8 05/12/2016   MCV 84 05/12/2016   PLT 414 (H) 05/12/2016    Results for orders placed or performed in visit on 05/29/16  CoaguChek XS/INR Waived  Result Value Ref Range   INR 2.3 (H) 0.9 - 1.1   Prothrombin Time 28.8 sec       Assessment:    ICD-9-CM ICD-10-CM   1. Mechanical heart valve present V43.3 Z95.2 CoaguChek XS/INR Waived  2. Neoplasm of uncertain behavior of skin 238.2 D48.5 Ambulatory referral to Dermatology   Has numerous moles on his arms and chest. Would like to see dermatology. Referral generated today.    Plan:   Discussed current plan face-to-face with patient. For coumadin dosing, elected to continue current dose. Will plan to recheck INR in 1 week.

## 2016-06-07 ENCOUNTER — Telehealth: Payer: Self-pay | Admitting: Family Medicine

## 2016-06-07 NOTE — Telephone Encounter (Signed)
Pt's girlfriend called wanted to check status of Dermatology. Please call pt's girlfriend with any questions or concerns. Thanks.

## 2016-06-07 NOTE — Telephone Encounter (Signed)
Spoke with patient's girlfriend, gave her name of dermatologist that he was referred to, she will call and see when he can be seen.

## 2016-06-07 NOTE — Telephone Encounter (Signed)
Pt's girlfriend called stated the dermatologist office we referred him to was extremely rude. She stated they can not see him until April 2018, wants to know if there is any way we can get him in any sooner. Stated he is concerned about spots on his arms, back, and legs. Please call patient to follow up. Thanks.

## 2016-06-08 ENCOUNTER — Telehealth: Payer: Self-pay | Admitting: Family Medicine

## 2016-06-08 NOTE — Telephone Encounter (Signed)
Called and spoke to Roberto [name] about the results of his ECHO.

## 2016-06-09 NOTE — Telephone Encounter (Signed)
Tried to call [place] came on, will try to call again.

## 2016-06-12 ENCOUNTER — Encounter: Payer: Self-pay | Admitting: Family Medicine

## 2016-06-12 ENCOUNTER — Ambulatory Visit (INDEPENDENT_AMBULATORY_CARE_PROVIDER_SITE_OTHER): Payer: Medicare Other | Admitting: Family Medicine

## 2016-06-12 VITALS — BP 124/83 | HR 78 | Temp 98.7°F | Wt 331.8 lb

## 2016-06-12 DIAGNOSIS — Z952 Presence of prosthetic heart valve: Secondary | ICD-10-CM

## 2016-06-12 LAB — COAGUCHEK XS/INR WAIVED
INR: 2.8 — AB (ref 0.9–1.1)
PROTHROMBIN TIME: 33.8 s

## 2016-06-12 NOTE — Progress Notes (Signed)
   BP 124/83 (BP Location: Left Arm, Patient Position: Sitting, Cuff Size: Large)   Pulse 78   Temp 98.7 F (37.1 C)   Wt (!) 331 lb 12.8 oz (150.5 kg)   SpO2 94%   BMI 54.88 kg/m    Subjective:    Patient ID: Roberto [name], male    DOB: 05-16-1956, 60 y.o.   MRN: [id number]  CC: Coumadin management  HPI: This patient is a 60 y.o. male who presents for coumadin management. The expected duration of coumadin treatment is lifelong The reason for anticoagulation is  mechanical heart valve.  Present Coumadin dose: 7.5, 4mg , 4mg  alternating Goal: 2.5-3.5  Excessive bruising: no Nose bleeding: no Rectal bleeding: no Prolonged menstrual cycles: N/A Eating diet with consistent amounts of foods containing Vitamin K:yes Any recent antibiotic use? no  Relevant past medical, surgical, family and social history reviewed and updated as indicated. Interim medical history since our last visit reviewed. Allergies and medications reviewed and updated.  ROS: Per HPI unless specifically indicated above     Objective:    BP 124/83 (BP Location: Left Arm, Patient Position: Sitting, Cuff Size: Large)   Pulse 78   Temp 98.7 F (37.1 C)   Wt (!) 331 lb 12.8 oz (150.5 kg)   SpO2 94%   BMI 54.88 kg/m   Wt Readings from Last 3 Encounters:  06/12/16 (!) 331 lb 12.8 oz (150.5 kg)  06/05/16 (!) 332 lb (150.6 kg)  06/01/16 (!) 332 lb 14.3 oz (151 kg)     General: Well appearing, well nourished in no distress.  Normal mood and affect. Skin: No excessive bruising or rash  Last INR: 2.8 Last PT: 33.8    Last CBC:  Lab Results  Component Value Date   WBC 10.6 05/12/2016   HGB 13.7 05/06/2016   HCT 43.8 05/12/2016   MCV 84 05/12/2016   PLT 414 (H) 05/12/2016    Results for orders placed or performed in visit on 06/06/16  HM DIABETES EYE EXAM  Result Value Ref Range   HM Diabetic Eye Exam No Retinopathy No Retinopathy       Assessment:     ICD-9-CM ICD-10-CM   1. Mechanical heart  valve present V43.3 Z95.2 CoaguChek XS/INR Waived    Plan:   Discussed current plan face-to-face with patient. For coumadin dosing, elected to continue current dose. Will plan to recheck INR in 2 weeks.

## 2016-06-13 NOTE — Telephone Encounter (Signed)
Called and spoke with [facility], they do not have any openings any soon. Patient is more than welcome to call and check for cancellations.  Patient notified.

## 2016-06-15 ENCOUNTER — Ambulatory Visit: Payer: Medicare Other

## 2016-06-22 ENCOUNTER — Ambulatory Visit: Payer: Medicare Other

## 2016-06-28 ENCOUNTER — Ambulatory Visit: Payer: Medicare Other | Admitting: Family Medicine

## 2016-06-29 ENCOUNTER — Ambulatory Visit: Payer: Medicare Other

## 2016-06-30 ENCOUNTER — Encounter: Payer: Self-pay | Admitting: Family Medicine

## 2016-06-30 ENCOUNTER — Ambulatory Visit: Payer: Medicare Other | Admitting: Family Medicine

## 2016-06-30 ENCOUNTER — Ambulatory Visit (INDEPENDENT_AMBULATORY_CARE_PROVIDER_SITE_OTHER): Payer: Medicare Other | Admitting: Family Medicine

## 2016-06-30 VITALS — BP 131/80 | HR 66 | Ht 65.0 in | Wt 330.0 lb

## 2016-06-30 DIAGNOSIS — G8929 Other chronic pain: Secondary | ICD-10-CM | POA: Diagnosis not present

## 2016-06-30 DIAGNOSIS — M545 Low back pain, unspecified: Secondary | ICD-10-CM

## 2016-06-30 DIAGNOSIS — D489 Neoplasm of uncertain behavior, unspecified: Secondary | ICD-10-CM | POA: Diagnosis not present

## 2016-06-30 DIAGNOSIS — Z952 Presence of prosthetic heart valve: Secondary | ICD-10-CM | POA: Diagnosis not present

## 2016-06-30 LAB — COAGUCHEK XS/INR WAIVED
INR: 3.4 — AB (ref 0.9–1.1)
Prothrombin Time: 40.4 s

## 2016-06-30 MED ORDER — CYCLOBENZAPRINE HCL 10 MG PO TABS: 10.0000 mg | ORAL_TABLET | Freq: Every day | ORAL | 0 refills | Status: DC

## 2016-06-30 MED ORDER — NAPROXEN 500 MG PO TABS: 500.0000 mg | ORAL_TABLET | Freq: Two times a day (BID) | ORAL | 0 refills | Status: DC

## 2016-06-30 NOTE — Assessment & Plan Note (Signed)
INR under good control. Recheck 1 month.

## 2016-06-30 NOTE — Assessment & Plan Note (Signed)
Will restart his flexeril and start naproxen, discussed risk of bleeding- take with food and stop with any red or dark stools. Recheck 1 month.

## 2016-06-30 NOTE — Progress Notes (Signed)
BP 131/80   Pulse 66   Ht 5\' 5"  (1.651 m)   Wt (!) 330 lb (149.7 kg)   SpO2 94%   BMI 54.91 kg/m    Subjective:    Patient ID: Roberto [name], male    DOB: 1955/08/15, 60 y.o.   MRN: [id number]  CC: Coumadin management  HPI: This patient is a 60 y.o. male who presents for coumadin management. The expected duration of coumadin treatment is lifelong The reason for anticoagulation is  mechanical heart valve.  Present Coumadin dose: Goal: 2.5-3.5  Excessive bruising: no Nose bleeding: no Rectal bleeding: no Prolonged menstrual cycles: N/A Eating diet with consistent amounts of foods containing Vitamin K:yes Any recent antibiotic use? no   BACK PAIN Duration: chronic Mechanism of injury: unknown Location: Right and low back Onset: gradual Severity: moderate Quality: pressure-like Frequency: waxing and waning Radiation: none Aggravating factors: sitting Alleviating factors: laying down Status: stable Treatments attempted: rest  Relief with NSAIDs?: No NSAIDs Taken Nighttime pain:  occasionally Paresthesias / decreased sensation:  no Bowel / bladder incontinence:  no Fevers:  no Dysuria / urinary frequency:  no  Relevant past medical, surgical, family and social history reviewed and updated as indicated. Interim medical history since our last visit reviewed. Allergies and medications reviewed and updated.  ROS: Per HPI unless specifically indicated above     Objective:    BP 131/80   Pulse 66   Ht 5\' 5"  (1.651 m)   Wt (!) 330 lb (149.7 kg)   SpO2 94%   BMI 54.91 kg/m   Wt Readings from Last 3 Encounters:  06/30/16 (!) 330 lb (149.7 kg)  06/12/16 (!) 331 lb 12.8 oz (150.5 kg)  06/05/16 (!) 332 lb (150.6 kg)    Physical Exam  Constitutional: He is oriented to person, place, and time. He appears well-developed and well-nourished. No distress.  HENT:  Head: Normocephalic and atraumatic.  Right Ear: Hearing normal.  Left Ear: Hearing normal.  Nose: Nose  normal.  Eyes: Conjunctivae and lids are normal. Right eye exhibits no discharge. Left eye exhibits no discharge. No scleral icterus.  Pulmonary/Chest: Effort normal. No respiratory distress.  Neurological: He is alert and oriented to person, place, and time.  Skin: Skin is warm, dry and intact. No rash noted. He is not diaphoretic. No erythema. No pallor.  Mole on L side of nose in mustache, irritated with eating  Psychiatric: He has a normal mood and affect. His speech is normal and behavior is normal. Judgment and thought content normal. Cognition and memory are normal.  Nursing note and vitals reviewed. Back Exam:    Inspection:  Normal spinal curvature.  No deformity, ecchymosis, erythema, or lesions     Palpation:     Midline spinal tenderness: no      Paralumbar tenderness: yes Left     Parathoracic tenderness: no      Buttocks tenderness: no     Range of Motion:      Flexion: Fingers to Knees     Extension:Decreased     Lateral bending:Decreased    Rotation:Decreased    Neuro Exam:Lower extremity DTRs normal & symmetric.  Strength and sensation intact.    Special Tests:      Straight leg raise:negative  Last INR: 3.4 Last PT: 40.4    Last CBC:  Lab Results  Component Value Date   WBC 10.6 05/12/2016   HGB 13.7 05/06/2016   HCT 43.8 05/12/2016   MCV 84 05/12/2016  PLT 414 (H) 05/12/2016    Results for orders placed or performed in visit on 06/12/16  CoaguChek XS/INR Waived  Result Value Ref Range   INR 2.8 (H) 0.9 - 1.1   Prothrombin Time 33.8 sec       Assessment:   Problem List Items Addressed This Visit      Cardiovascular and Mediastinum   Mechanical heart valve present    INR under good control. Recheck 1 month.       Relevant Orders   CoaguChek XS/INR Waived     Other   H/O aortic valve replacement - Primary   Relevant Orders   CoaguChek XS/INR Waived   Chronic bilateral low back pain without sciatica    Will restart his flexeril and start  naproxen, discussed risk of bleeding- take with food and stop with any red or dark stools. Recheck 1 month.       Relevant Medications   naproxen (NAPROSYN) 500 MG tablet   cyclobenzaprine (FLEXERIL) 10 MG tablet    Other Visit Diagnoses    Neoplasm of uncertain behavior       Will remove mole next visit.        Plan:   Discussed current plan face-to-face with patient. For coumadin dosing, elected to continue current dose. Will plan to recheck INR in 1 month.

## 2016-07-04 ENCOUNTER — Telehealth: Payer: Self-pay | Admitting: Family Medicine

## 2016-07-04 DIAGNOSIS — I700 Atherosclerosis of aorta: Secondary | ICD-10-CM

## 2016-07-04 DIAGNOSIS — N179 Acute kidney failure, unspecified: Secondary | ICD-10-CM | POA: Diagnosis not present

## 2016-07-04 DIAGNOSIS — I421 Obstructive hypertrophic cardiomyopathy: Secondary | ICD-10-CM

## 2016-07-04 DIAGNOSIS — Z952 Presence of prosthetic heart valve: Secondary | ICD-10-CM

## 2016-07-04 DIAGNOSIS — I10 Essential (primary) hypertension: Secondary | ICD-10-CM

## 2016-07-04 DIAGNOSIS — E782 Mixed hyperlipidemia: Secondary | ICD-10-CM

## 2016-07-04 DIAGNOSIS — I7121 Aneurysm of the ascending aorta, without rupture: Secondary | ICD-10-CM

## 2016-07-04 DIAGNOSIS — I712 Thoracic aortic aneurysm, without rupture: Secondary | ICD-10-CM

## 2016-07-04 DIAGNOSIS — N280 Ischemia and infarction of kidney: Secondary | ICD-10-CM | POA: Diagnosis not present

## 2016-07-04 NOTE — Telephone Encounter (Signed)
Patient would like to see someone local.

## 2016-07-04 NOTE — Telephone Encounter (Signed)
[name] called and stated that Dr Holley Raring would like for the pt to go to cardiology.

## 2016-07-04 NOTE — Telephone Encounter (Signed)
He had been seeing [facility]- does he want to see someone else? If so I'll put the referral in

## 2016-07-04 NOTE — Telephone Encounter (Signed)
Referral generated

## 2016-07-06 ENCOUNTER — Ambulatory Visit: Payer: Medicare Other

## 2016-07-10 ENCOUNTER — Ambulatory Visit (INDEPENDENT_AMBULATORY_CARE_PROVIDER_SITE_OTHER): Payer: Medicare Other | Admitting: Family Medicine

## 2016-07-10 ENCOUNTER — Encounter: Payer: Self-pay | Admitting: Family Medicine

## 2016-07-10 VITALS — BP 99/66 | HR 80 | Temp 98.5°F | Wt 329.0 lb

## 2016-07-10 DIAGNOSIS — J101 Influenza due to other identified influenza virus with other respiratory manifestations: Secondary | ICD-10-CM | POA: Diagnosis not present

## 2016-07-10 DIAGNOSIS — R05 Cough: Secondary | ICD-10-CM | POA: Diagnosis not present

## 2016-07-10 LAB — VERITOR FLU A/B WAIVED
Influenza A: POSITIVE — AB
Influenza B: NEGATIVE

## 2016-07-10 MED ORDER — BENZONATATE 100 MG PO CAPS: 200.0000 mg | ORAL_CAPSULE | Freq: Three times a day (TID) | ORAL | 0 refills | Status: DC | PRN

## 2016-07-10 MED ORDER — OSELTAMIVIR PHOSPHATE 75 MG PO CAPS: 75.0000 mg | ORAL_CAPSULE | Freq: Two times a day (BID) | ORAL | 0 refills | Status: DC

## 2016-07-10 NOTE — Patient Instructions (Signed)
Follow up as needed

## 2016-07-10 NOTE — Progress Notes (Signed)
BP 99/66   Pulse 80   Temp 98.5 F (36.9 C)   Wt (!) 329 lb (149.2 kg)   SpO2 95%   BMI 54.75 kg/m    Subjective:    Patient ID: Roberto [name], male    DOB: 1955/10/31, 61 y.o.   MRN: [id number]  HPI: Roberto [name] is a 61 y.o. male  Chief Complaint  Patient presents with  . URI    x 3 days, body aches, fever, chills, cough, chest congestion, head congestion/pressure. No ear ache, no sore throar.   Patient presents with 3 day history of sudden onset body aches, fever, chills, cough, congestion, and HA. Denies sore throat, ear pain, N/V/D, CP. Taking Coricidin with no relief. Significant other is also sick with same symptoms.   Past Medical History:  Diagnosis Date  . Cellulitis   . CHF (congestive heart failure) ([place])   . Clotting disorder ([place])   . H/O mechanical aortic valve replacement   . Hypercholesterolemia   . Stroke ([place])   . TIA (transient ischemic attack) 05/2014   Social History   Social History  . Marital status: Single    Spouse name: N/A  . Number of children: 2  . Years of education: N/A   Occupational History  . Disabled Unemployed   Social History Main Topics  . Smoking status: Former Smoker    Packs/day: 2.00    Years: 40.00    Types: Cigarettes    Quit date: 12/02/2015  . Smokeless tobacco: Never Used  . Alcohol use No  . Drug use: No  . Sexual activity: No   Other Topics Concern  . Not on file   Social History Narrative  . No narrative on file    Relevant past medical, surgical, family and social history reviewed and updated as indicated. Interim medical history since our last visit reviewed. Allergies and medications reviewed and updated.  Review of Systems  Constitutional: Positive for chills, diaphoresis, fatigue and fever.  HENT: Positive for congestion.   Eyes: Negative.   Respiratory: Positive for cough.   Cardiovascular: Negative.   Gastrointestinal: Negative.   Genitourinary: Negative.   Musculoskeletal:  Negative.   Neurological: Positive for headaches.  Psychiatric/Behavioral: Negative.     Per HPI unless specifically indicated above     Objective:    BP 99/66   Pulse 80   Temp 98.5 F (36.9 C)   Wt (!) 329 lb (149.2 kg)   SpO2 95%   BMI 54.75 kg/m   Wt Readings from Last 3 Encounters:  07/10/16 (!) 329 lb (149.2 kg)  06/30/16 (!) 330 lb (149.7 kg)  06/12/16 (!) 331 lb 12.8 oz (150.5 kg)    Physical Exam  Constitutional: He is oriented to person, place, and time. He appears well-developed and well-nourished. No distress.  HENT:  Head: Atraumatic.  Right Ear: External ear normal.  Left Ear: External ear normal.  Oropharynx erythematous  Eyes: Conjunctivae are normal. Pupils are equal, round, and reactive to light.  Neck: Normal range of motion. Neck supple.  Cardiovascular: Normal rate and normal heart sounds.   Pulmonary/Chest: Effort normal. No respiratory distress.  Musculoskeletal: Normal range of motion.  Lymphadenopathy:    He has no cervical adenopathy.  Neurological: He is alert and oriented to person, place, and time.  Skin: Skin is warm and dry.  Psychiatric: He has a normal mood and affect. His behavior is normal.  Nursing note and vitals reviewed.   Results for orders placed or  performed in visit on 06/30/16  CoaguChek XS/INR Waived  Result Value Ref Range   INR 3.4 (H) 0.9 - 1.1   Prothrombin Time 40.4 sec      Assessment & Plan:   Problem List Items Addressed This Visit    None    Visit Diagnoses    Influenza A    -  Primary   Tamiflu sent, as well as tessalon perles for his persistent cough. Supportive care discussed. Follow up if no improvement.    Relevant Medications   oseltamivir (TAMIFLU) 75 MG capsule   Other Relevant Orders   Influenza A & B (STAT)       Follow up plan: No Follow-up on file.

## 2016-07-11 ENCOUNTER — Encounter: Payer: Self-pay | Admitting: *Deleted

## 2016-07-11 ENCOUNTER — Ambulatory Visit: Payer: Medicare Other | Admitting: Internal Medicine

## 2016-07-11 NOTE — Progress Notes (Deleted)
New Outpatient Visit Date: 07/11/2016  Referring Provider: Valerie Roys, DO [place], [place] [address]  Chief Complaint: ***  HPI:  Roberto [name] is a 61 y.o. year-old male with history of mechanical aortic valve replacement, stroke, and renal infarct, who has been referred by Dr. Wynetta Emery for ***.  --------------------------------------------------------------------------------------------------  Cardiovascular History & Procedures: Cardiovascular Problems:  Aortic valve replacement  Cardiomyopathy  Risk Factors:  ***  Cath/PCI:  ***  CV Surgery:  ***  EP Procedures and Devices:  ***  Non-Invasive Evaluation(s):  TTE (06/05/16): Technically difficult study.  Moderate LV dilation and moderate LVH.  LVEF 40-45%.  Mechanical aortic valve present with mean gradient of 14 mmHg, peak velocity of 2.5 m/s. Mildly thickened mitral valve with mild MR. Mildly dilated RV with mildly reduced contraction. Mild TR.  Recent CV Pertinent Labs: Lab Results  Component Value Date   CHOL 193 04/19/2016   CHOL 131 05/25/2014   HDL 30 (L) 05/25/2014   LDLCALC 68 05/25/2014   TRIG 371 (H) 04/19/2016   TRIG 167 05/25/2014   INR 3.4 (H) 06/30/2016   INR 1.9 05/25/2014   K 4.3 05/12/2016   K 3.6 05/25/2014   BUN 13 05/12/2016   BUN 15 05/25/2014   CREATININE 1.23 05/12/2016   CREATININE 0.85 05/25/2014    --------------------------------------------------------------------------------------------------  Past Medical History:  Diagnosis Date  . Cellulitis   . CHF (congestive heart failure) ([place])   . Clotting disorder ([place])   . H/O mechanical aortic valve replacement   . Hypercholesterolemia   . Stroke ([place])   . TIA (transient ischemic attack) 05/2014    Past Surgical History:  Procedure Laterality Date  . AORTIC VALVE REPLACEMENT    . CARDIAC SURGERY  2009   CHF  . CARPAL TUNNEL RELEASE Left 2005  . TONSILLECTOMY  1962    Outpatient Encounter Prescriptions  as of 07/11/2016  Medication Sig  . albuterol (PROVENTIL HFA;VENTOLIN HFA) 108 (90 Base) MCG/ACT inhaler Inhale 2 puffs into the lungs every 6 (six) hours as needed for wheezing or shortness of breath.  Marland Kitchen aspirin 81 MG chewable tablet Chew 81 mg by mouth daily.    . benzonatate (TESSALON) 100 MG capsule Take 2 capsules (200 mg total) by mouth 3 (three) times daily as needed.  . cyclobenzaprine (FLEXERIL) 10 MG tablet Take 1 tablet (10 mg total) by mouth at bedtime.  . Fluticasone-Salmeterol (ADVAIR) 250-50 MCG/DOSE AEPB Inhale 1 puff into the lungs 2 (two) times daily.  . furosemide (LASIX) 40 MG tablet Take 1 tablet (40 mg total) by mouth daily.  Marland Kitchen lisinopril (PRINIVIL,ZESTRIL) 2.5 MG tablet Take 1 tablet (2.5 mg total) by mouth daily. (Patient taking differently: Take 2.5 mg by mouth at bedtime. )  . metoprolol tartrate (LOPRESSOR) 25 MG tablet Take 1 tablet (25 mg total) by mouth 2 (two) times daily.  . naproxen (NAPROSYN) 500 MG tablet Take 1 tablet (500 mg total) by mouth 2 (two) times daily with a meal.  . oseltamivir (TAMIFLU) 75 MG capsule Take 1 capsule (75 mg total) by mouth 2 (two) times daily.  . simvastatin (ZOCOR) 40 MG tablet TAKE 1 TABLET (40 MG TOTAL) BY MOUTH AT BEDTIME.  Marland Kitchen tiotropium (SPIRIVA) 18 MCG inhalation capsule Place 18 mcg into inhaler and inhale daily.  Marland Kitchen warfarin (COUMADIN) 4 MG tablet Take 1 tablet (4 mg total) by mouth daily. (Patient taking differently: Take 4 mg by mouth at bedtime. )  . warfarin (COUMADIN) 7.5 MG tablet  Take 1 tablet (7.5 mg total) by mouth daily.   No facility-administered encounter medications on file as of 07/11/2016.     Allergies: Patient has no known allergies.  Social History   Social History  . Marital status: Single    Spouse name: N/A  . Number of children: 2  . Years of education: N/A   Occupational History  . Disabled Unemployed   Social History Main Topics  . Smoking status: Former Smoker    Packs/day: 2.00    Years:  40.00    Types: Cigarettes    Quit date: 12/02/2015  . Smokeless tobacco: Never Used  . Alcohol use No  . Drug use: No  . Sexual activity: No   Other Topics Concern  . Not on file   Social History Narrative  . No narrative on file    Family History  Problem Relation Age of Onset  . Arthritis Mother   . Cancer Mother   . Arthritis Father   . Diabetes Father   . Stroke Father   . Cancer Father   . Cancer Brother   . Cancer Sister     Review of Systems: A 12-system review of systems was performed and was negative except as noted in the HPI.  --------------------------------------------------------------------------------------------------  Physical Exam: There were no vitals taken for this visit.  General:  *** HEENT: No conjunctival pallor or scleral icterus.  Moist mucous membranes.  OP clear. Neck: Supple without lymphadenopathy, thyromegaly, JVD, or HJR.  No carotid bruit. Lungs: Normal work of breathing.  Clear to auscultation bilaterally without wheezes or crackles. Heart: Regular rate and rhythm without murmurs, rubs, or gallops.  Non-displaced PMI. Abd: Bowel sounds present.  Soft, NT/ND without hepatosplenomegaly Ext: No lower extremity edema.  Radial, PT, and DP pulses are 2+ bilaterally Skin: warm and dry without rash Neuro: CNIII-XII intact.  Strength and fine-touch sensation intact in upper and lower extremities bilaterally. Psych: Normal mood and affect.  EKG:  ***  Lab Results  Component Value Date   WBC 10.6 05/12/2016   HGB 13.7 05/06/2016   HCT 43.8 05/12/2016   MCV 84 05/12/2016   PLT 414 (H) 05/12/2016    Lab Results  Component Value Date   NA 140 05/12/2016   K 4.3 05/12/2016   CL 98 05/12/2016   CO2 25 05/12/2016   BUN 13 05/12/2016   CREATININE 1.23 05/12/2016   GLUCOSE 92 05/12/2016   ALT 27 05/12/2016    Lab Results  Component Value Date   CHOL 193 04/19/2016   HDL 30 (L) 05/25/2014   LDLCALC 68 05/25/2014   TRIG 371 (H)  04/19/2016     --------------------------------------------------------------------------------------------------  ASSESSMENT AND PLAN: Roberto Gave Skyy Mcknight, MD 07/11/2016 9:05 AM

## 2016-07-13 ENCOUNTER — Ambulatory Visit: Payer: Medicare Other

## 2016-07-16 ENCOUNTER — Encounter: Payer: Self-pay | Admitting: Hematology and Oncology

## 2016-07-18 ENCOUNTER — Encounter: Payer: Self-pay | Admitting: Dietician

## 2016-07-18 NOTE — Progress Notes (Signed)
Discharge letter sent to MD after pt missed appt for diabetes class a second time on 07-06-16

## 2016-07-20 ENCOUNTER — Ambulatory Visit: Payer: Medicare Other

## 2016-07-27 ENCOUNTER — Encounter: Payer: Self-pay | Admitting: Family Medicine

## 2016-07-27 ENCOUNTER — Ambulatory Visit (INDEPENDENT_AMBULATORY_CARE_PROVIDER_SITE_OTHER): Payer: Medicare Other | Admitting: Family Medicine

## 2016-07-27 VITALS — BP 112/78 | HR 72 | Temp 98.0°F | Wt 330.0 lb

## 2016-07-27 DIAGNOSIS — J441 Chronic obstructive pulmonary disease with (acute) exacerbation: Secondary | ICD-10-CM | POA: Diagnosis not present

## 2016-07-27 DIAGNOSIS — D489 Neoplasm of uncertain behavior, unspecified: Secondary | ICD-10-CM | POA: Diagnosis not present

## 2016-07-27 DIAGNOSIS — Z952 Presence of prosthetic heart valve: Secondary | ICD-10-CM

## 2016-07-27 DIAGNOSIS — D485 Neoplasm of uncertain behavior of skin: Secondary | ICD-10-CM | POA: Diagnosis not present

## 2016-07-27 DIAGNOSIS — D230 Other benign neoplasm of skin of lip: Secondary | ICD-10-CM | POA: Diagnosis not present

## 2016-07-27 LAB — COAGUCHEK XS/INR WAIVED
INR: 2.7 — ABNORMAL HIGH (ref 0.9–1.1)
Prothrombin Time: 32.7 s

## 2016-07-27 MED ORDER — PREDNISONE 50 MG PO TABS: 50.0000 mg | ORAL_TABLET | Freq: Every day | ORAL | 0 refills | Status: DC

## 2016-07-27 NOTE — Assessment & Plan Note (Signed)
Discussed current plan face-to-face with patient. For coumadin dosing, elected to continue current dose. Will plan to recheck INR in 1 month. 

## 2016-07-27 NOTE — Progress Notes (Signed)
BP 112/78 (BP Location: Right Arm, Patient Position: Sitting, Cuff Size: Large)   Pulse 72   Temp 98 F (36.7 C)   Wt (!) 330 lb (149.7 kg)   SpO2 96%   BMI 54.91 kg/m    Subjective:    Patient ID: Roberto [name], male    DOB: [birth year], 61 y.o.   MRN: [id number]  HPI: Roberto [name] is a 62 y.o. male  Chief Complaint  Patient presents with  . Anticoagulation  . Cough  . skin lesion   COUMADIN MANAGEMENT: The expected duration of coumadin treatment is lifelong The reason for anticoagulation is  mechanical heart valve.  Present Coumadin dose: 7.5, 4mg , 4mg  alternating Goal: 2.5-3.5  Excessive bruising:no Nose bleeding:no Rectal bleeding:no Prolonged menstrual cycles:N/A Eating diet with consistent amounts of foods containing Vitamin K:yes Any recent antibiotic use?no  UPPER RESPIRATORY TRACT INFECTION Duration: 2.5 weeks Worst symptom: coughing, SOB Fever: no Cough: yes Shortness of breath: yes Wheezing: yes Chest pain: no Chest tightness: yes Chest congestion: yes Nasal congestion: no Runny nose: no Post nasal drip: no Sneezing: no Sore throat: no Swollen glands: no Sinus pressure: no Headache: no Face pain: no Toothache: no Ear pain: no  Ear pressure: no  Eyes red/itching:no Eye drainage/crusting: no  Vomiting: no Rash: no Fatigue: yes Sick contacts: yes Strep contacts: no  Context: better Recurrent sinusitis: no Relief with OTC cold/cough medications: no  Treatments attempted: none   Would like to have the mole on his lip removed.  Relevant past medical, surgical, family and social history reviewed and updated as indicated. Interim medical history since our last visit reviewed. Allergies and medications reviewed and updated.  Review of Systems  Constitutional: Negative.   HENT: Positive for congestion. Negative for dental problem, drooling, ear discharge, ear pain, facial swelling, hearing loss, mouth sores, nosebleeds, postnasal  drip, rhinorrhea, sinus pain, sinus pressure, sneezing, sore throat, tinnitus and trouble swallowing.   Respiratory: Positive for cough, chest tightness, shortness of breath and wheezing. Negative for apnea, choking and stridor.   Cardiovascular: Negative.   Psychiatric/Behavioral: Negative.     Per HPI unless specifically indicated above     Objective:    BP 112/78 (BP Location: Right Arm, Patient Position: Sitting, Cuff Size: Large)   Pulse 72   Temp 98 F (36.7 C)   Wt (!) 330 lb (149.7 kg)   SpO2 96%   BMI 54.91 kg/m   Wt Readings from Last 3 Encounters:  07/27/16 (!) 330 lb (149.7 kg)  07/10/16 (!) 329 lb (149.2 kg)  06/30/16 (!) 330 lb (149.7 kg)    Physical Exam  Constitutional: He is oriented to person, place, and time. He appears well-developed and well-nourished. No distress.  HENT:  Head: Normocephalic and atraumatic.  Right Ear: Hearing and external ear normal.  Left Ear: Hearing and external ear normal.  Nose: Nose normal.  Mouth/Throat: Oropharynx is clear and moist. No oropharyngeal exudate.  Eyes: Conjunctivae, EOM and lids are normal. Pupils are equal, round, and reactive to light. Right eye exhibits no discharge. Left eye exhibits no discharge. No scleral icterus.  Neck: Normal range of motion. Neck supple. No JVD present. No tracheal deviation present. No thyromegaly present.  Cardiovascular: Normal rate, regular rhythm, normal heart sounds and intact distal pulses.  Exam reveals no gallop and no friction rub.   No murmur heard. Pulmonary/Chest: Effort normal. No stridor. No respiratory distress. He has decreased breath sounds in the right upper field, the right middle field, the  right lower field, the left upper field, the left middle field and the left lower field. He has no wheezes. He has no rales. He exhibits no tenderness.  Musculoskeletal: Normal range of motion.  Lymphadenopathy:    He has no cervical adenopathy.  Neurological: He is alert and  oriented to person, place, and time.  Skin: Skin is warm, dry and intact. No rash noted. He is not diaphoretic. No erythema. No pallor.  0.5cm flesh colored mole on L philtrum  Psychiatric: He has a normal mood and affect. His speech is normal and behavior is normal. Judgment and thought content normal. Cognition and memory are normal.  Nursing note and vitals reviewed.       Last PT: 32.7 Last INR: 2.7   Results for orders placed or performed in visit on 07/27/16  CoaguChek XS/INR Waived  Result Value Ref Range   INR 2.7 (H) 0.9 - 1.1   Prothrombin Time 32.7 sec      Assessment & Plan:   Problem List Items Addressed This Visit      Cardiovascular and Mediastinum   Mechanical heart valve present - Primary    Discussed current plan face-to-face with patient. For coumadin dosing, elected to continue current dose. Will plan to recheck INR in 1 month.       Relevant Orders   CoaguChek XS/INR Waived (Completed)    Other Visit Diagnoses    COPD exacerbation ([place])       Will treat with steroid burst. Call with any concerns or if not getting better.    Relevant Medications   predniSONE (DELTASONE) 50 MG tablet   Neoplasm of uncertain behavior       Will send off for pathology. Await results.    Relevant Orders   Pathology Report      Skin Procedure  Procedure: Informed consent given.  Sterile prep of the area.  Area infiltrated with lidocaine without epinephrine.  Using a surgical blade, part of the upper dermis shaved off and sent  for pathology.  Area cauterized. Pt ed on scarring     Diagnosis:   ICD-9-CM ICD-10-CM   1. Mechanical heart valve present V43.3 Z95.2 CoaguChek XS/INR Waived  2. COPD exacerbation ([place]) 491.21 J44.1    Will treat with steroid burst. Call with any concerns or if not getting better.   3. Neoplasm of uncertain behavior 0000000 D48.9 Pathology Report   Will send off for pathology. Await results.     Lesion Location/Size: 0.5cm flesh colored on  L philtrum Physician: MJ Consent:  Risks, benefits, and alternative treatments discussed and all questions were answered.  Patient elected to proceed and verbal consent obtained.  Description: Area prepped and draped using semi-sterile technique. Area locally anesthetized using 2 cc's of lidocaine 1% plain. Shave biopsy of lesion performed using a dermablade.  Adequate hemostastis achieved using electrocautery. Post Procedure Instructions: Wound care instructions discussed and patient was instructed to keep area clean and dry.  Signs and symptoms of infection discussed, patient agrees to contact the office ASAP should they occur.  Dressing change recommended daily.   Follow up plan: Return in about 4 weeks (around 08/24/2016).

## 2016-07-27 NOTE — Progress Notes (Deleted)
   There were no vitals taken for this visit.   Subjective:    Patient ID: Roberto [name], male    DOB: Nov 10, 1955, 61 y.o.   MRN: [id number]  CC: Coumadin management  HPI: This patient is a 61 y.o. male who presents for coumadin management. The expected duration of coumadin treatment is lifelong The reason for anticoagulation is  mechanical heart valve.  Present Coumadin dose: 7.5, 4mg , 4mg  alternating Goal: 2.5-3.5  Excessive bruising: no Nose bleeding: no Rectal bleeding: no Prolonged menstrual cycles: N/A Eating diet with consistent amounts of foods containing Vitamin K:yes Any recent antibiotic use? no  Relevant past medical, surgical, family and social history reviewed and updated as indicated. Interim medical history since our last visit reviewed. Allergies and medications reviewed and updated.  ROS: Per HPI unless specifically indicated above     Objective:    There were no vitals taken for this visit.  Wt Readings from Last 3 Encounters:  07/10/16 (!) 329 lb (149.2 kg)  06/30/16 (!) 330 lb (149.7 kg)  06/12/16 (!) 331 lb 12.8 oz (150.5 kg)     General: Well appearing, well nourished in no distress.  Normal mood and affect. Skin: No excessive bruising or rash  Last INR:     Last CBC:  Lab Results  Component Value Date   WBC 10.6 05/12/2016   HGB 13.7 05/06/2016   HCT 43.8 05/12/2016   MCV 84 05/12/2016   PLT 414 (H) 05/12/2016    Results for orders placed or performed in visit on 07/10/16  Influenza A & B (STAT)  Result Value Ref Range   Influenza A Positive (A) Negative   Influenza B Negative Negative       Assessment:     ICD-9-CM ICD-10-CM   1. Mechanical heart valve present V43.3 Z95.2 CoaguChek XS/INR Waived    Plan:   Discussed current plan face-to-face with patient. For coumadin dosing, elected to {Blank single:19197::"continue current dose","change dose to","hold dose"}. Will plan to recheck INR in {Blank single:19197::"1 month","1  week","2 weeks"}.

## 2016-07-31 LAB — PATHOLOGY

## 2016-08-01 NOTE — Progress Notes (Signed)
New Outpatient Visit Date: 08/02/2016  Referring Provider: Valerie Roys, DO [place], [place] [address]  Chief Complaint: Heart valve replacement  HPI:  Mr. Fertitta is a 61 y.o. year-old male with history of mechanical aortic valve replacement, stroke, right renal infarct, chronic systolic heart failure (LVEF 40-45%), and hyperlipidemia who has been referred by Dr. Wynetta Emery for management of aortic valve disease. He was previously followed in [facility] by [facility] cardiology, having been last seen about 3 years ago.  Aortic valve replaced in 2008 in [place], [place], due to severe AS with decompensated heart failure.  He had experienced progressive fluid retention over the course of 2-3 years.  Since surgery, he reports feeling well without chest pain.  He has stable exertion dyspnea with walking 100 yards on level ground.  He can climb 1 flight of stairs without difficulty.  He has 2 pillow orthopnea but no PND.  Symptoms have been stable for at least 1 year.  He denies leg swelling and abdominal distention.  The last episode of edema was ~6 months ago.  He takes additional Lasix if he notices swelling; he is currently taking 40 mg daily.  Mr. Lescarbeau denies palpitations and lightheadedness.  Patient had a stroke ~2 years ago.  He initially noticed difficulty speaking, but he has no residual deficits.  Patient had right renal infarct in the setting of warfarin withdrawal in anticipation of screening colonoscopy.  He was not bridged with anticoagulation and presented with right flank pain.  CT showed right renal infarcts.  Mr. Kamerman is unsure if he had a cardiac catheterization before AVR.  --------------------------------------------------------------------------------------------------  Cardiovascular History & Procedures: Cardiovascular Problems:  Aortic valve replacement  Chronic systolic heart failure  Stroke  Renal infarct  Risk Factors:  Hyperlipidemia,  stroke  Cath/PCI:  None available  CV Surgery:  Mechanical aortic valve replacement  EP Procedures and Devices:  None available  Non-Invasive Evaluation(s):  Transthoracic echocardiogram (06/05/16): Technically difficult study.  Moderately dilated left ventricle with moderate LVH.  Moderately reduced LV contraction (EF 40-45%).  AVR well-seated with 14 mmHg gradient.  Peak AV velocity 2.5 m/s. Mild mitral valve thickening with mild MR.  Mildly dilated RV with mildly reduced contraction.  Recent CV Pertinent Labs: Lab Results  Component Value Date   CHOL 193 04/19/2016   CHOL 131 05/25/2014   HDL 30 (L) 05/25/2014   LDLCALC 68 05/25/2014   TRIG 371 (H) 04/19/2016   TRIG 167 05/25/2014   INR 2.7 (H) 07/27/2016   INR 1.9 05/25/2014   K 4.3 05/12/2016   K 3.6 05/25/2014   BUN 13 05/12/2016   BUN 15 05/25/2014   CREATININE 1.23 05/12/2016   CREATININE 0.85 05/25/2014    --------------------------------------------------------------------------------------------------  Past Medical History:  Diagnosis Date  . Cellulitis   . CHF (congestive heart failure) ([place])   . Clotting disorder ([place])   . H/O mechanical aortic valve replacement   . Hypercholesterolemia   . Stroke ([place])   . TIA (transient ischemic attack) 05/2014    Past Surgical History:  Procedure Laterality Date  . AORTIC VALVE REPLACEMENT    . CARDIAC SURGERY  2009   CHF  . CARPAL TUNNEL RELEASE Left 2005  . TONSILLECTOMY  1962    Outpatient Encounter Prescriptions as of 08/02/2016  Medication Sig  . albuterol (PROVENTIL HFA;VENTOLIN HFA) 108 (90 Base) MCG/ACT inhaler Inhale 2 puffs into the lungs every 6 (six) hours as needed for wheezing or shortness of breath.  Marland Kitchen  aspirin 81 MG chewable tablet Chew 81 mg by mouth daily.    . benzonatate (TESSALON) 100 MG capsule Take 2 capsules (200 mg total) by mouth 3 (three) times daily as needed.  . cyclobenzaprine (FLEXERIL) 10 MG tablet Take 1 tablet (10 mg total)  by mouth at bedtime.  . Fluticasone-Salmeterol (ADVAIR) 250-50 MCG/DOSE AEPB Inhale 1 puff into the lungs 2 (two) times daily.  . furosemide (LASIX) 40 MG tablet Take 1 tablet (40 mg total) by mouth daily.  Marland Kitchen lisinopril (PRINIVIL,ZESTRIL) 2.5 MG tablet Take 1 tablet (2.5 mg total) by mouth daily. (Patient taking differently: Take 2.5 mg by mouth at bedtime. )  . metoprolol tartrate (LOPRESSOR) 25 MG tablet Take 1 tablet (25 mg total) by mouth 2 (two) times daily.  . naproxen (NAPROSYN) 500 MG tablet Take 1 tablet (500 mg total) by mouth 2 (two) times daily with a meal.  . predniSONE (DELTASONE) 50 MG tablet Take 1 tablet (50 mg total) by mouth daily with breakfast.  . simvastatin (ZOCOR) 40 MG tablet TAKE 1 TABLET (40 MG TOTAL) BY MOUTH AT BEDTIME.  Marland Kitchen tiotropium (SPIRIVA) 18 MCG inhalation capsule Place 18 mcg into inhaler and inhale daily.  Marland Kitchen warfarin (COUMADIN) 4 MG tablet Take 1 tablet (4 mg total) by mouth daily. (Patient taking differently: Take 4 mg by mouth at bedtime. )  . warfarin (COUMADIN) 7.5 MG tablet Take 1 tablet (7.5 mg total) by mouth daily.   No facility-administered encounter medications on file as of 08/02/2016.     Allergies: Patient has no known allergies.  Social History   Social History  . Marital status: Single    Spouse name: N/A  . Number of children: 2  . Years of education: N/A   Occupational History  . Disabled Unemployed   Social History Main Topics  . Smoking status: Former Smoker    Packs/day: 2.00    Years: 40.00    Types: Cigarettes    Quit date: 12/02/2015  . Smokeless tobacco: Never Used  . Alcohol use No  . Drug use: No  . Sexual activity: No   Other Topics Concern  . Not on file   Social History Narrative  . No narrative on file    Family History  Problem Relation Age of Onset  . Arthritis Mother   . Cancer Mother   . Arthritis Father   . Diabetes Father   . Stroke Father   . Cancer Father   . Cancer Brother   . Cancer Sister      Review of Systems: A 12-system review of systems was performed and was negative except as noted in the HPI.  --------------------------------------------------------------------------------------------------  Physical Exam: BP 100/64 (BP Location: Right Wrist, Patient Position: Sitting, Cuff Size: Normal)   Pulse 63   Ht 5\' 5"  (1.651 m)   Wt (!) 328 lb 12 oz (149.1 kg)   BMI 54.71 kg/m   General:  Morbidly obese man, seated comfortably in the exam room. He is accompanied by his significant other. HEENT: No conjunctival pallor or scleral icterus.  Moist mucous membranes.  OP clear. Neck: Supple without lymphadenopathy, thyromegaly, JVD, or HJR.  No carotid bruit. Lungs: Normal work of breathing.  Clear to auscultation bilaterally without wheezes or crackles. Heart: Regular rate and rhythm with normal S1 and crisp, mechanical S2.  1/6 systolic murmur at RUSB. No rubs or gallops..  Unable to assess PMI due to body habitus. Abd: Bowel sounds present.  Soft, NT/ND. Unable to asses  hepatosplenomegaly due to body habitus. Extremities: No lower extremity edema.  Radial, PT, and DP pulses are 2+ bilaterally Skin: warm and dry without rash Neuro: CNIII-XII intact.  Strength and fine-touch sensation intact in upper and lower extremities bilaterally. Psych: Normal mood and affect.  EKG:  Normal sinus rhythm without significant abnormalities.  Lab Results  Component Value Date   WBC 10.6 05/12/2016   HGB 13.7 05/06/2016   HCT 43.8 05/12/2016   MCV 84 05/12/2016   PLT 414 (H) 05/12/2016    Lab Results  Component Value Date   NA 140 05/12/2016   K 4.3 05/12/2016   CL 98 05/12/2016   CO2 25 05/12/2016   BUN 13 05/12/2016   CREATININE 1.23 05/12/2016   GLUCOSE 92 05/12/2016   ALT 27 05/12/2016    Lab Results  Component Value Date   CHOL 193 04/19/2016   HDL 30 (L) 05/25/2014   LDLCALC 68 05/25/2014   TRIG 371 (H) 04/19/2016      --------------------------------------------------------------------------------------------------  ASSESSMENT AND PLAN: Aortic stenosis status post mechanical aortic valve Exam is normal today with crisp S2 and very soft systolic murmur.  Recent TTE in the setting of right renal infarct due to discontinuation of anticoagulation showed well-seated aortic valve prosthesis with mean gradient of 14 mmHg.  This likely reflects normal prosthesis function, though we will request records from his prior cardiologist as well as from his valve surgery in 2009 in order to know what type/size valve replacement he has.  Mr. Blankley will need to remain on lifelong anticoagulation.  I agree with a target INR of 2.5-3.5, given history of multiple embolic events and cardiomyopathy.  I will defer management of his warfarin to his PCP.  Chronic biventricular heart failure Recent echo showed moderately reduced LVEF as well as right ventricular dysfunction.  The patient appears euvolemic on exam today, though is body habitus limits evaluation.  He reports NYHA class III heart failure symptoms that have been stable for at least a year.  Much of his function limitation may also be due to his morbid obesity.  In an effort to optimize his evidence-based heart failure regimen, we will switch metoprolol tartrate to metoprolol succinate 50 mg daily.  I will not make any changes to his other medications.  We will obtain outside records to better understand his previous workup.  If he has not undergone ischemia evaluation in the past or his LVEF has declined recently, he will likely need further evaluation for CAD.  Hyperlipidemia Continue atorvastatin, with continued management by his PCP.  Follow-up: Return to clinic in 6 weeks.  Nelva Bush, MD 08/01/2016 9:48 PM

## 2016-08-02 ENCOUNTER — Encounter: Payer: Self-pay | Admitting: Internal Medicine

## 2016-08-02 ENCOUNTER — Ambulatory Visit (INDEPENDENT_AMBULATORY_CARE_PROVIDER_SITE_OTHER): Payer: Medicare Other | Admitting: Internal Medicine

## 2016-08-02 VITALS — BP 100/64 | HR 63 | Ht 65.0 in | Wt 328.8 lb

## 2016-08-02 DIAGNOSIS — I50814 Right heart failure due to left heart failure: Secondary | ICD-10-CM

## 2016-08-02 DIAGNOSIS — Z952 Presence of prosthetic heart valve: Secondary | ICD-10-CM | POA: Diagnosis not present

## 2016-08-02 DIAGNOSIS — I350 Nonrheumatic aortic (valve) stenosis: Secondary | ICD-10-CM | POA: Diagnosis not present

## 2016-08-02 DIAGNOSIS — E785 Hyperlipidemia, unspecified: Secondary | ICD-10-CM | POA: Diagnosis not present

## 2016-08-02 MED ORDER — METOPROLOL SUCCINATE ER 50 MG PO TB24: 50.0000 mg | ORAL_TABLET | Freq: Every day | ORAL | 3 refills | Status: DC

## 2016-08-02 NOTE — Patient Instructions (Signed)
Medication Instructions:  Your physician has recommended you make the following change in your medication:  1- STOP taking Metoprolol Tartrate. 2- START taking Metoprolol Succinate 50 mg (1 tablet) by mouth once a day.   Labwork: none  Testing/Procedures: none  Follow-Up: Your physician recommends that you schedule a follow-up appointment in: [place].  We will have you sign consent to request previous medical records.   If you need a refill on your cardiac medications before your next appointment, please call your pharmacy.

## 2016-08-08 ENCOUNTER — Telehealth: Payer: Self-pay | Admitting: Internal Medicine

## 2016-08-08 NOTE — Telephone Encounter (Signed)
L MOM TO VERIFY PT HAD PREVIOUS HEART RECORDS AT [facility]

## 2016-08-15 ENCOUNTER — Other Ambulatory Visit: Payer: Self-pay | Admitting: Family Medicine

## 2016-08-16 MED ORDER — ALBUTEROL SULFATE HFA 108 (90 BASE) MCG/ACT IN AERS: 2.0000 | INHALATION_SPRAY | Freq: Four times a day (QID) | RESPIRATORY_TRACT | 3 refills | Status: DC | PRN

## 2016-08-29 ENCOUNTER — Ambulatory Visit: Payer: Medicare Other | Admitting: Family Medicine

## 2016-08-30 ENCOUNTER — Encounter: Payer: Self-pay | Admitting: Family Medicine

## 2016-08-30 ENCOUNTER — Ambulatory Visit (INDEPENDENT_AMBULATORY_CARE_PROVIDER_SITE_OTHER): Payer: Medicare Other | Admitting: Family Medicine

## 2016-08-30 VITALS — BP 105/75 | HR 63 | Temp 98.6°F | Resp 17 | Ht 65.0 in | Wt 329.0 lb

## 2016-08-30 DIAGNOSIS — Z952 Presence of prosthetic heart valve: Secondary | ICD-10-CM

## 2016-08-30 LAB — COAGUCHEK XS/INR WAIVED
INR: 2.7 — AB (ref 0.9–1.1)
PROTHROMBIN TIME: 32.8 s

## 2016-08-30 NOTE — Progress Notes (Signed)
   BP 105/75 (BP Location: Left Arm, Patient Position: Sitting, Cuff Size: Large)   Pulse 63   Temp 98.6 F (37 C) (Oral)   Resp 17   Ht 5\' 5"  (1.651 m)   Wt (!) 329 lb (149.2 kg)   SpO2 96%   BMI 54.75 kg/m    Subjective:    Patient ID: Roberto [name], male    DOB: [birth year], 61 y.o.   MRN: [id number]  CC: Coumadin management  HPI: This patient is a 61 y.o. male who presents for coumadin management. The expected duration of coumadin treatment is lifelong The reason for anticoagulation is  mechanical heart valve.  Present Coumadin dose: 4 mg daily Goal: 2.5-3.5  Excessive bruising: no Nose bleeding: no Rectal bleeding: no Prolonged menstrual cycles: N/A Eating diet with consistent amounts of foods containing Vitamin K:yes Any recent antibiotic use? no  Relevant past medical, surgical, family and social history reviewed and updated as indicated. Interim medical history since our last visit reviewed. Allergies and medications reviewed and updated.  ROS: Per HPI unless specifically indicated above     Objective:    BP 105/75 (BP Location: Left Arm, Patient Position: Sitting, Cuff Size: Large)   Pulse 63   Temp 98.6 F (37 C) (Oral)   Resp 17   Ht 5\' 5"  (1.651 m)   Wt (!) 329 lb (149.2 kg)   SpO2 96%   BMI 54.75 kg/m   Wt Readings from Last 3 Encounters:  08/30/16 (!) 329 lb (149.2 kg)  08/02/16 (!) 328 lb 12 oz (149.1 kg)  07/27/16 (!) 330 lb (149.7 kg)     General: Well appearing, well nourished in no distress.  Normal mood and affect. Skin: No excessive bruising or rash  Last INR: 2.7 Last PT:  32.8    Last CBC:  Lab Results  Component Value Date   WBC 10.6 05/12/2016   HGB 13.7 05/06/2016   HCT 43.8 05/12/2016   MCV 84 05/12/2016   PLT 414 (H) 05/12/2016    Results for orders placed or performed in visit on 07/27/16  CoaguChek XS/INR Waived  Result Value Ref Range   INR 2.7 (H) 0.9 - 1.1   Prothrombin Time 32.7 sec  Pathology Report  Result  Value Ref Range   PATH REPORT.SITE OF ORIGIN SPEC Comment    . Comment    PATH REPORT.FINAL DX SPEC Comment    SIGNED OUT BY: Comment    GROSS DESCRIPTION: Comment    . Comment    PAYMENT PROCEDURE Comment        Assessment:     ICD-9-CM ICD-10-CM   1. Mechanical heart valve present V43.3 Z95.2 CoaguChek XS/INR Waived    Plan:   Discussed current plan face-to-face with patient. For coumadin dosing, elected to continue current dose. Will plan to recheck INR in 1 month.

## 2016-08-30 NOTE — Progress Notes (Signed)
   There were no vitals taken for this visit.   Subjective:    Patient ID: Roberto [name], male    DOB: May 03, 1956, 61 y.o.   MRN: [id number]  HPI: Roberto [name] is a 61 y.o. male  No chief complaint on file.   Relevant past medical, surgical, family and social history reviewed and updated as indicated. Interim medical history since our last visit reviewed. Allergies and medications reviewed and updated.  Review of Systems  Per HPI unless specifically indicated above     Objective:    There were no vitals taken for this visit.  Wt Readings from Last 3 Encounters:  08/02/16 (!) 328 lb 12 oz (149.1 kg)  07/27/16 (!) 330 lb (149.7 kg)  07/10/16 (!) 329 lb (149.2 kg)    Physical Exam  Results for orders placed or performed in visit on 07/27/16  CoaguChek XS/INR Waived  Result Value Ref Range   INR 2.7 (H) 0.9 - 1.1   Prothrombin Time 32.7 sec  Pathology Report  Result Value Ref Range   PATH REPORT.SITE OF ORIGIN SPEC Comment    . Comment    PATH REPORT.FINAL DX SPEC Comment    SIGNED OUT BY: Comment    GROSS DESCRIPTION: Comment    . Comment    PAYMENT PROCEDURE Comment       Assessment & Plan:   Problem List Items Addressed This Visit      Cardiovascular and Mediastinum   Mechanical heart valve present - Primary   Relevant Orders   CoaguChek XS/INR Waived       Follow up plan: No Follow-up on file.

## 2016-09-13 ENCOUNTER — Ambulatory Visit: Payer: Medicare Other | Admitting: Internal Medicine

## 2016-09-27 ENCOUNTER — Ambulatory Visit (INDEPENDENT_AMBULATORY_CARE_PROVIDER_SITE_OTHER): Payer: Medicare Other | Admitting: Internal Medicine

## 2016-09-27 ENCOUNTER — Encounter: Payer: Self-pay | Admitting: Internal Medicine

## 2016-09-27 VITALS — BP 110/64 | HR 69 | Ht 66.0 in | Wt 328.8 lb

## 2016-09-27 DIAGNOSIS — Z952 Presence of prosthetic heart valve: Secondary | ICD-10-CM

## 2016-09-27 DIAGNOSIS — I5022 Chronic systolic (congestive) heart failure: Secondary | ICD-10-CM | POA: Diagnosis not present

## 2016-09-27 DIAGNOSIS — I10 Essential (primary) hypertension: Secondary | ICD-10-CM | POA: Diagnosis not present

## 2016-09-27 DIAGNOSIS — E785 Hyperlipidemia, unspecified: Secondary | ICD-10-CM | POA: Diagnosis not present

## 2016-09-27 DIAGNOSIS — I428 Other cardiomyopathies: Secondary | ICD-10-CM | POA: Diagnosis not present

## 2016-09-27 DIAGNOSIS — Z79899 Other long term (current) drug therapy: Secondary | ICD-10-CM

## 2016-09-27 MED ORDER — LISINOPRIL 5 MG PO TABS: 5.0000 mg | ORAL_TABLET | Freq: Every day | ORAL | 3 refills | Status: DC

## 2016-09-27 NOTE — Progress Notes (Signed)
Follow-up Outpatient Visit Date: 09/27/2016  Primary Care Provider: Park Liter, DO [address] [place] [address]  Chief Complaint: Follow-up mechanical aortic valve and chronic systolic heart failure  HPI:  Mr. Holtsclaw is a 61 y.o. year-old male with history of mechanical aortic valve replacement, stroke, right renal infarct, chronic systolic heart failure (LVEF 40-45%), and hyperlipidemia, who presents for follow-up of chronic systolic heart failure and mechanical aortic valve. I met him on 08/02/16, which time his only complaint was of fatigue. We switched metoprolol tartrate to metoprolol succinate 50 mg daily. Today, Mr. Vickrey reports that he has been feeling well. His energy has improved since switching to metoprolol succinate. He also recently began exercising at the gym, with gradual improvement in his exercise capacity. He denies chest pain, palpitations, lightheadedness, orthopnea, and leg edema. He notes mild exertional dyspnea when walking on the treadmill. He also experiences occasional pain along the dorsal aspect of both feet when walking. He does not have any thigh or calf pain with ambulation. He remains compliant with medications, including warfarin. Most recent INR was therapeutic at 2.7 on 08/30/16.  -------------------------------------------------------------------------------------------------- Cardiovascular History & Procedures: Cardiovascular Problems:  Bicuspid aortic valve status post aortic valve replacement (2/99/37)  Chronic systolic heart failure  Stroke  Renal infarct  Risk Factors:  Hyperlipidemia, stroke  Cath/PCI:  LHC/RHC (03/21/10, [place], [place]): No significant CAD. LVEDP 31 mmHg. Mean AoV gradient 34 mmHg at rest and 47 mmHg with dobutamine 20 mcg/kg/min. AVA 1.0 cm^2. RA 31, RV 68/25, PA 68/47, PCWP 38. PA sat 65%. CO 6.2 L/min ([place]) and 5.3 L/min (thermodilution).  CV Surgery:  Mechanical aortic valve  replacement and ascending aortoplasty (03/25/10,  [place], [place]): #27 [employer] mechanical valve.  EP Procedures and Devices:  None available  Non-Invasive Evaluation(s):  Transthoracic echocardiogram (06/05/16): Technically difficult study.  Moderately dilated left ventricle with moderate LVH.  Moderately reduced LV contraction (EF 40-45%).  AVR well-seated with 14 mmHg gradient.  Peak AV velocity 2.5 m/s. Mild mitral valve thickening with mild MR.  Mildly dilated RV with mildly reduced contraction.  Transthoracic echocardiogram (05/13/14, [facility]): Mild LVH with LVEF 35-40% and global hypokinesis. Mechanical aortic valve present with trace regurgitation. Normal LVOT gradient. Mild TR. Mild pulmonary hypertension. Normal RV size and function.  Recent CV Pertinent Labs: Lab Results  Component Value Date   CHOL 193 04/19/2016   CHOL 131 05/25/2014   HDL 30 (L) 05/25/2014   LDLCALC 68 05/25/2014   TRIG 371 (H) 04/19/2016   TRIG 167 05/25/2014   INR 2.7 (H) 08/30/2016   INR 1.9 05/25/2014   K 4.3 05/12/2016   K 3.6 05/25/2014   BUN 13 05/12/2016   BUN 15 05/25/2014   CREATININE 1.23 05/12/2016   CREATININE 0.85 05/25/2014    Past medical and surgical history were reviewed and updated in EPIC.  Outpatient Encounter Prescriptions as of 09/27/2016  Medication Sig  . albuterol (PROVENTIL HFA;VENTOLIN HFA) 108 (90 Base) MCG/ACT inhaler Inhale 2 puffs into the lungs every 6 (six) hours as needed for wheezing or shortness of breath.  Marland Kitchen aspirin 81 MG chewable tablet Chew 81 mg by mouth daily.    . Fluticasone-Salmeterol (ADVAIR) 250-50 MCG/DOSE AEPB Inhale 1 puff into the lungs 2 (two) times daily.  . furosemide (LASIX) 40 MG tablet Take 1 tablet (40 mg total) by mouth daily.  Marland Kitchen lisinopril (PRINIVIL,ZESTRIL) 2.5 MG tablet Take 1 tablet (2.5 mg total) by mouth daily. (Patient  taking differently: Take 2.5 mg by mouth at bedtime. )  . metoprolol  succinate (TOPROL-XL) 50 MG 24 hr tablet Take 1 tablet (50 mg total) by mouth daily. Take with or immediately following a meal.  . simvastatin (ZOCOR) 40 MG tablet TAKE 1 TABLET (40 MG TOTAL) BY MOUTH AT BEDTIME.  Marland Kitchen tiotropium (SPIRIVA) 18 MCG inhalation capsule Place 18 mcg into inhaler and inhale daily.  Marland Kitchen warfarin (COUMADIN) 4 MG tablet Take 1 tablet (4 mg total) by mouth daily. (Patient taking differently: Take 4 mg by mouth at bedtime. )  . warfarin (COUMADIN) 7.5 MG tablet Take 1 tablet (7.5 mg total) by mouth daily.   No facility-administered encounter medications on file as of 09/27/2016.     Allergies: Patient has no known allergies.  Social History   Social History  . Marital status: Single    Spouse name: N/A  . Number of children: 2  . Years of education: N/A   Occupational History  . Disabled Unemployed   Social History Main Topics  . Smoking status: Former Smoker    Packs/day: 2.00    Years: 40.00    Types: Cigarettes    Quit date: 12/02/2015  . Smokeless tobacco: Never Used  . Alcohol use No  . Drug use: No  . Sexual activity: No   Other Topics Concern  . Not on file   Social History Narrative  . No narrative on file    Family History  Problem Relation Age of Onset  . Arthritis Mother   . Cancer Mother     colon  . Arthritis Father   . Diabetes Father   . Stroke Father   . Cancer Father     colon  . Cancer Brother     brain  . Cancer Sister     breast  . Heart disease Neg Hx     Review of Systems: A 12-system review of systems was performed and was negative except as noted in the HPI.  --------------------------------------------------------------------------------------------------  Physical Exam: BP 110/64 (BP Location: Left Arm, Patient Position: Sitting, Cuff Size: Large)   Pulse 69   Ht '5\' 6"'$  (1.676 m)   Wt (!) 328 lb 12 oz (149.1 kg)   BMI 53.06 kg/m   General:  Morbidly obese man, seated comfortably in the exam room. He is  accompanied by his wife. HEENT: No conjunctival pallor or scleral icterus.  Moist mucous membranes.  OP clear. Neck: Supple without lymphadenopathy or thyromegaly. No obvious JVD or HJR, the body habitus limits evaluation. Lungs: Normal work of breathing.  Clear to auscultation bilaterally without wheezes or crackles. Heart: Mechanical S2 noted. Regular rate and rhythm with 1/6 systolic murmur loudest at the left upper sternal border. No rubs or gallops. Abd: Bowel sounds present.  Soft, NT/ND. Unable to assess the paraspinal megaly due to body habitus. Ext: Trace pretibial edema bilaterally with chronic skin changes.  Radial, PT, and DP pulses are 2+ bilaterally. Skin: warm and dry without rash  EKG:  Normal sinus rhythm without significant abnormalities. Unchanged from prior tracing on 08/02/16 (I have personally reviewed both tracings).  Lab Results  Component Value Date   WBC 10.6 05/12/2016   HGB 13.7 05/06/2016   HCT 43.8 05/12/2016   MCV 84 05/12/2016   PLT 414 (H) 05/12/2016    Lab Results  Component Value Date   NA 140 05/12/2016   K 4.3 05/12/2016   CL 98 05/12/2016   CO2 25 05/12/2016   BUN  13 05/12/2016   CREATININE 1.23 05/12/2016   GLUCOSE 92 05/12/2016   ALT 27 05/12/2016    Lab Results  Component Value Date   CHOL 193 04/19/2016   HDL 30 (L) 05/25/2014   LDLCALC 68 05/25/2014   TRIG 371 (H) 04/19/2016   --------------------------------------------------------------------------------------------------  ASSESSMENT AND PLAN: Chronic systolic heart failure secondary to non-ischemic cardiomyopathy Mr. Vanscyoc appears euvolemic and well-compensated with NYHA class II symptoms. He feels better since switching metoprolol tartrate to succinate; we will continue his current dose. We have agreed to increase lisinopril from 2.5 mg daily to 5 mg daily; we will check a BMP today and again in ~2 weeks to ensure stable renal function and potassium. Given relatively stable  LVEF following AVR and lack of CAD by cath in 2011, we will not pursue ischemia evaluation unless symptoms coronary insufficiency develop. I encouraged the patient to continue his exercise regimen.  Aortic stenosis status post mechanical AVR No clinical findings to suggest valve failure. INR is therapeutic. Given recent embolic event with renal infarct, patient will need to remain on lifelong anticoagulation, including heparin/LMWH bridging for any invasive procedures. I will defer continued INR checks to Dr. Wynetta Emery.  Hypertension BP is well-controlled today. As above, we will increase lisinopril to optimize evidence-based heart failure regimen.  Hyperlipidemia Continue simvastatin; defer follow-up and further management to Dr. Wynetta Emery.  Follow-up: Return to clinic in 3 months.  Nelva Bush, MD 09/27/2016 11:56 AM

## 2016-09-27 NOTE — Patient Instructions (Addendum)
Medication Instructions:  Your physician has recommended you make the following change in your medication:  1- INCREASE Lisinopril to 5 mg by mouth once a day.   Labwork: 1- Your physician recommends that you return for lab work in: TODAY (BMP).  2- Your physician recommends that you return for lab work in: [place] 10/11/16. - Please go to the [facility]. You will check in at the front desk to the right as you walk into the atrium. Valet Parking is offered if needed.   Testing/Procedures: none  Follow-Up: Your physician recommends that you schedule a follow-up appointment in: 3 MONTHS WITH DR END.  If you need a refill on your cardiac medications before your next appointment, please call your pharmacy.

## 2016-09-28 ENCOUNTER — Encounter: Payer: Self-pay | Admitting: Family Medicine

## 2016-09-28 ENCOUNTER — Ambulatory Visit (INDEPENDENT_AMBULATORY_CARE_PROVIDER_SITE_OTHER): Payer: Medicare Other | Admitting: Family Medicine

## 2016-09-28 VITALS — BP 108/73 | HR 71 | Temp 98.1°F | Resp 17 | Ht 66.0 in | Wt 329.0 lb

## 2016-09-28 DIAGNOSIS — Z952 Presence of prosthetic heart valve: Secondary | ICD-10-CM

## 2016-09-28 LAB — BASIC METABOLIC PANEL
BUN / CREAT RATIO: 21 (ref 10–24)
BUN: 17 mg/dL (ref 8–27)
CO2: 24 mmol/L (ref 18–29)
CREATININE: 0.8 mg/dL (ref 0.76–1.27)
Calcium: 9.3 mg/dL (ref 8.6–10.2)
Chloride: 99 mmol/L (ref 96–106)
GFR calc Af Amer: 111 mL/min/{1.73_m2} (ref >59)
GFR, EST NON AFRICAN AMERICAN: 96 mL/min/{1.73_m2} (ref >59)
GLUCOSE: 101 mg/dL — AB (ref 65–99)
POTASSIUM: 4.2 mmol/L (ref 3.5–5.2)
Sodium: 140 mmol/L (ref 134–144)

## 2016-09-28 LAB — COAGUCHEK XS/INR WAIVED
INR: 2.2 — AB (ref 0.9–1.1)
Prothrombin Time: 26 s

## 2016-09-28 NOTE — Progress Notes (Signed)
   BP 108/73 (BP Location: Left Arm, Patient Position: Sitting, Cuff Size: Large)   Pulse 71   Temp 98.1 F (36.7 C) (Oral)   Resp 17   Ht 5\' 6"  (1.676 m)   Wt (!) 329 lb (149.2 kg)   SpO2 97%   BMI 53.10 kg/m    Subjective:    Patient ID: Roberto [name], male    DOB: [birth year], 61 y.o.   MRN: [id number]  CC: Coumadin management  HPI: This patient is a 61 y.o. male who presents for coumadin management. The expected duration of coumadin treatment is lifelong The reason for anticoagulation is  mechanical heart valve.  Present Coumadin dose:  7.5, 4mg , 4mg  alternating Goal: 2.5-3.5  Excessive bruising: no Nose bleeding: no Rectal bleeding: no Prolonged menstrual cycles: N/A Eating diet with consistent amounts of foods containing Vitamin K: No- ate a big salad yesterday Any recent antibiotic use? no  Relevant past medical, surgical, family and social history reviewed and updated as indicated. Interim medical history since our last visit reviewed. Allergies and medications reviewed and updated.  ROS: Per HPI unless specifically indicated above     Objective:    BP 108/73 (BP Location: Left Arm, Patient Position: Sitting, Cuff Size: Large)   Pulse 71   Temp 98.1 F (36.7 C) (Oral)   Resp 17   Ht 5\' 6"  (1.676 m)   Wt (!) 329 lb (149.2 kg)   SpO2 97%   BMI 53.10 kg/m   Wt Readings from Last 3 Encounters:  09/28/16 (!) 329 lb (149.2 kg)  09/27/16 (!) 328 lb 12 oz (149.1 kg)  08/30/16 (!) 329 lb (149.2 kg)     General: Well appearing, well nourished in no distress.  Normal mood and affect. Skin: No excessive bruising or rash  Last INR: 2.2 Last PT: 26.0    Last CBC:  Lab Results  Component Value Date   WBC 10.6 05/12/2016   HGB 13.7 05/06/2016   HCT 43.8 05/12/2016   MCV 84 05/12/2016   PLT 414 (H) 05/12/2016    Results for orders placed or performed in visit on 40/98/11  Basic metabolic panel  Result Value Ref Range   Glucose 101 (H) 65 - 99 mg/dL   BUN 17 8 - 27 mg/dL   Creatinine, Ser 0.80 0.76 - 1.27 mg/dL   GFR calc non Af Amer 96 >59 mL/min/1.73   GFR calc Af Amer 111 >59 mL/min/1.73   BUN/Creatinine Ratio 21 10 - 24   Sodium 140 134 - 144 mmol/L   Potassium 4.2 3.5 - 5.2 mmol/L   Chloride 99 96 - 106 mmol/L   CO2 24 18 - 29 mmol/L   Calcium 9.3 8.6 - 10.2 mg/dL       Assessment:     ICD-9-CM ICD-10-CM   1. Mechanical heart valve present V43.3 Z95.2 CoaguChek XS/INR Waived (STAT)    Plan:   Discussed current plan face-to-face with patient. For coumadin dosing, elected to continue current dose. Will plan to recheck INR in 1 month.

## 2016-10-19 ENCOUNTER — Other Ambulatory Visit
Admission: RE | Admit: 2016-10-19 | Discharge: 2016-10-19 | Disposition: A | Discharge status: Home / Self Care | Payer: Medicare Other | Source: Ambulatory Visit | Attending: Internal Medicine | Admitting: Internal Medicine

## 2016-10-19 DIAGNOSIS — Z79899 Other long term (current) drug therapy: Secondary | ICD-10-CM

## 2016-10-19 DIAGNOSIS — I10 Essential (primary) hypertension: Secondary | ICD-10-CM | POA: Insufficient documentation

## 2016-10-19 LAB — BASIC METABOLIC PANEL
Anion gap: 6 (ref 5–15)
BUN: 22 mg/dL — AB (ref 6–20)
CHLORIDE: 105 mmol/L (ref 101–111)
CO2: 26 mmol/L (ref 22–32)
Calcium: 9.3 mg/dL (ref 8.9–10.3)
Creatinine, Ser: 1.05 mg/dL (ref 0.61–1.24)
GFR calc Af Amer: >60 mL/min (ref >60)
GLUCOSE: 103 mg/dL — AB (ref 65–99)
POTASSIUM: 4 mmol/L (ref 3.5–5.1)
Sodium: 137 mmol/L (ref 135–145)

## 2016-10-20 ENCOUNTER — Telehealth: Payer: Self-pay | Admitting: Internal Medicine

## 2016-10-20 NOTE — Telephone Encounter (Signed)
Patient friend [name] calling for results.  Please leave a detailed msg.

## 2016-10-20 NOTE — Telephone Encounter (Signed)
Spoke with patients daughter per release form and reviewed results. See results note.

## 2016-10-24 ENCOUNTER — Encounter: Payer: Self-pay | Admitting: Family Medicine

## 2016-10-24 ENCOUNTER — Ambulatory Visit (INDEPENDENT_AMBULATORY_CARE_PROVIDER_SITE_OTHER): Payer: Medicare Other | Admitting: Family Medicine

## 2016-10-24 VITALS — BP 116/74 | HR 63 | Temp 97.9°F | Wt 321.5 lb

## 2016-10-24 DIAGNOSIS — Z952 Presence of prosthetic heart valve: Secondary | ICD-10-CM

## 2016-10-24 DIAGNOSIS — I10 Essential (primary) hypertension: Secondary | ICD-10-CM

## 2016-10-24 DIAGNOSIS — E785 Hyperlipidemia, unspecified: Secondary | ICD-10-CM | POA: Diagnosis not present

## 2016-10-24 DIAGNOSIS — E119 Type 2 diabetes mellitus without complications: Secondary | ICD-10-CM

## 2016-10-24 LAB — MICROALBUMIN, URINE WAIVED
CREATININE, URINE WAIVED: 50 mg/dL (ref 10–300)
Microalb, Ur Waived: 10 mg/L (ref 0–19)

## 2016-10-24 LAB — COAGUCHEK XS/INR WAIVED
INR: 2.6 — ABNORMAL HIGH (ref 0.9–1.1)
Prothrombin Time: 31.6 s

## 2016-10-24 LAB — BAYER DCA HB A1C WAIVED: HB A1C (BAYER DCA - WAIVED): 6 % (ref <7.0)

## 2016-10-24 MED ORDER — METOPROLOL SUCCINATE ER 50 MG PO TB24: 50.0000 mg | ORAL_TABLET | Freq: Every day | ORAL | 3 refills | Status: DC

## 2016-10-24 MED ORDER — FUROSEMIDE 40 MG PO TABS: 40.0000 mg | ORAL_TABLET | Freq: Every day | ORAL | 1 refills | Status: DC

## 2016-10-24 MED ORDER — WARFARIN SODIUM 7.5 MG PO TABS: 7.5000 mg | ORAL_TABLET | Freq: Every day | ORAL | 1 refills | Status: DC

## 2016-10-24 MED ORDER — LISINOPRIL 5 MG PO TABS: 5.0000 mg | ORAL_TABLET | Freq: Every day | ORAL | 3 refills | Status: DC

## 2016-10-24 MED ORDER — WARFARIN SODIUM 4 MG PO TABS: 4.0000 mg | ORAL_TABLET | Freq: Every day | ORAL | 6 refills | Status: DC

## 2016-10-24 MED ORDER — ALBUTEROL SULFATE HFA 108 (90 BASE) MCG/ACT IN AERS: 2.0000 | INHALATION_SPRAY | Freq: Four times a day (QID) | RESPIRATORY_TRACT | 3 refills | Status: DC | PRN

## 2016-10-24 MED ORDER — SIMVASTATIN 40 MG PO TABS: ORAL_TABLET | ORAL | 1 refills | Status: DC

## 2016-10-24 NOTE — Assessment & Plan Note (Signed)
INR 2.6- continue current regimen and follow up in 1 month.

## 2016-10-24 NOTE — Assessment & Plan Note (Signed)
Under good control. Continue current regimen. Continue to monitor.  

## 2016-10-24 NOTE — Assessment & Plan Note (Signed)
Stable on current regimen. Continue to monitor. Call with any concerns.  

## 2016-10-24 NOTE — Progress Notes (Signed)
BP 116/74 (BP Location: Left Arm, Patient Position: Sitting, Cuff Size: Large)   Pulse 63   Temp 97.9 F (36.6 C)   Wt (!) 321 lb 8 oz (145.8 kg)   SpO2 100%   BMI 51.89 kg/m    Subjective:    Patient ID: Roberto [name], male    DOB: 1955/12/07, 61 y.o.   MRN: [id number]  HPI: Roberto [name] is a 61 y.o. male  Chief Complaint  Patient presents with  . Hyperlipidemia  . Hypertension   HYPERTENSION / Purdy Satisfied with current treatment? yes Duration of hypertension: chronic BP monitoring frequency: not checking BP medication side effects: no Past BP meds:  Duration of hyperlipidemia: chronic Cholesterol medication side effects: no Cholesterol supplements: none Past cholesterol medications:  Medication compliance: excellent compliance Aspirin: no Recent stressors: no Recurrent headaches: no Visual changes: no Palpitations: no Dyspnea: no Chest pain: no Lower extremity edema: no Dizzy/lightheaded: no  DIABETES Hypoglycemic episodes:no Polydipsia/polyuria: no Visual disturbance: no Chest pain: no Paresthesias: no Glucose Monitoring: no Taking Insulin?: no Blood Pressure Monitoring: not checking Retinal Examination: Up to Date Foot Exam: Up to Date Diabetic Education: Completed Pneumovax: Up to Date Influenza: Up to Date Aspirin: no  Coumadin Management.  The expected duration of coumadin treatment is lifelong The reason for anticoagulation is  mechanical heart valve.  Present Coumadin dose: Goal: 2.5-3.5  Excessive bruising: no Nose bleeding: no Rectal bleeding: no Prolonged menstrual cycles: N/A Eating diet with consistent amounts of foods containing Vitamin K:yes Any recent antibiotic use? no  Relevant past medical, surgical, family and social history reviewed and updated as indicated. Interim medical history since our last visit reviewed. Allergies and medications reviewed and updated.  Review of Systems  Constitutional:  Negative.   Respiratory: Negative.   Cardiovascular: Negative.   Psychiatric/Behavioral: Negative.     Per HPI unless specifically indicated above     Objective:    BP 116/74 (BP Location: Left Arm, Patient Position: Sitting, Cuff Size: Large)   Pulse 63   Temp 97.9 F (36.6 C)   Wt (!) 321 lb 8 oz (145.8 kg)   SpO2 100%   BMI 51.89 kg/m   Wt Readings from Last 3 Encounters:  10/24/16 (!) 321 lb 8 oz (145.8 kg)  09/28/16 (!) 329 lb (149.2 kg)  09/27/16 (!) 328 lb 12 oz (149.1 kg)    Physical Exam  Constitutional: He is oriented to person, place, and time. He appears well-developed and well-nourished. No distress.  HENT:  Head: Normocephalic and atraumatic.  Right Ear: Hearing normal.  Left Ear: Hearing normal.  Nose: Nose normal.  Eyes: Conjunctivae and lids are normal. Right eye exhibits no discharge. Left eye exhibits no discharge. No scleral icterus.  Cardiovascular: Normal rate, regular rhythm and intact distal pulses.  Exam reveals no gallop and no friction rub.   No murmur heard. + click  Pulmonary/Chest: Effort normal and breath sounds normal. No respiratory distress. He has no wheezes. He has no rales. He exhibits no tenderness.  Musculoskeletal: Normal range of motion.  Neurological: He is alert and oriented to person, place, and time.  Skin: Skin is warm, dry and intact. No rash noted. No erythema. No pallor.  Psychiatric: He has a normal mood and affect. His speech is normal and behavior is normal. Judgment and thought content normal. Cognition and memory are normal.  Nursing note and vitals reviewed.   Results for orders placed or performed during the hospital encounter of 42/68/34  Basic metabolic  panel  Result Value Ref Range   Sodium 137 135 - 145 mmol/L   Potassium 4.0 3.5 - 5.1 mmol/L   Chloride 105 101 - 111 mmol/L   CO2 26 22 - 32 mmol/L   Glucose, Bld 103 (H) 65 - 99 mg/dL   BUN 22 (H) 6 - 20 mg/dL   Creatinine, Ser 1.05 0.61 - 1.24 mg/dL    Calcium 9.3 8.9 - 10.3 mg/dL   GFR calc non Af Amer >60 >60 mL/min   GFR calc Af Amer >60 >60 mL/min   Anion gap 6 5 - 15      Assessment & Plan:   Problem List Items Addressed This Visit      Cardiovascular and Mediastinum   Benign essential hypertension    Stable on current regimen. Continue to monitor. Call with any concerns.       Relevant Medications   warfarin (COUMADIN) 7.5 MG tablet   warfarin (COUMADIN) 4 MG tablet   simvastatin (ZOCOR) 40 MG tablet   metoprolol succinate (TOPROL-XL) 50 MG 24 hr tablet   lisinopril (PRINIVIL,ZESTRIL) 5 MG tablet   furosemide (LASIX) 40 MG tablet   Other Relevant Orders   Comprehensive metabolic panel   Microalbumin, Urine Waived     Endocrine   Diabetes mellitus type 2, diet-controlled (HCC)    Stable on current regimen, A1c 6.0. Continue to monitor. Call with any concerns.       Relevant Medications   simvastatin (ZOCOR) 40 MG tablet   lisinopril (PRINIVIL,ZESTRIL) 5 MG tablet   Other Relevant Orders   Comprehensive metabolic panel   Microalbumin, Urine Waived   Bayer DCA Hb A1c Waived     Other   Mechanical heart valve present    INR 2.6- continue current regimen and follow up in 1 month.       Relevant Orders   CBC with Differential/Platelet   Comprehensive metabolic panel   CoaguChek XS/INR Waived   Hyperlipidemia - Primary    Under good control. Continue current regimen. Continue to monitor.       Relevant Medications   warfarin (COUMADIN) 7.5 MG tablet   warfarin (COUMADIN) 4 MG tablet   simvastatin (ZOCOR) 40 MG tablet   metoprolol succinate (TOPROL-XL) 50 MG 24 hr tablet   lisinopril (PRINIVIL,ZESTRIL) 5 MG tablet   furosemide (LASIX) 40 MG tablet   Other Relevant Orders   Comprehensive metabolic panel   Lipid Panel w/o Chol/HDL Ratio       Follow up plan: Return in about 4 weeks (around 11/21/2016) for INR.

## 2016-10-24 NOTE — Assessment & Plan Note (Addendum)
Stable on current regimen, A1c 6.0. Continue to monitor. Call with any concerns.

## 2016-10-25 LAB — CBC WITH DIFFERENTIAL/PLATELET
BASOS: 1 %
Basophils Absolute: 0 10*3/uL (ref 0.0–0.2)
EOS (ABSOLUTE): 0.2 10*3/uL (ref 0.0–0.4)
EOS: 3 %
HEMATOCRIT: 46.7 % (ref 37.5–51.0)
HEMOGLOBIN: 15.7 g/dL (ref 13.0–17.7)
Immature Grans (Abs): 0 10*3/uL (ref 0.0–0.1)
Immature Granulocytes: 0 %
Lymphocytes Absolute: 2.1 10*3/uL (ref 0.7–3.1)
Lymphs: 34 %
MCH: 29.1 pg (ref 26.6–33.0)
MCHC: 33.6 g/dL (ref 31.5–35.7)
MCV: 87 fL (ref 79–97)
Monocytes Absolute: 0.5 10*3/uL (ref 0.1–0.9)
Monocytes: 8 %
NEUTROS ABS: 3.4 10*3/uL (ref 1.4–7.0)
Neutrophils: 54 %
Platelets: 252 10*3/uL (ref 150–379)
RBC: 5.4 x10E6/uL (ref 4.14–5.80)
RDW: 15.4 % (ref 12.3–15.4)
WBC: 6.2 10*3/uL (ref 3.4–10.8)

## 2016-10-25 LAB — COMPREHENSIVE METABOLIC PANEL
ALBUMIN: 4.2 g/dL (ref 3.6–4.8)
ALK PHOS: 61 IU/L (ref 39–117)
ALT: 27 IU/L (ref 0–44)
AST: 24 IU/L (ref 0–40)
Albumin/Globulin Ratio: 1.4 (ref 1.2–2.2)
BILIRUBIN TOTAL: 0.3 mg/dL (ref 0.0–1.2)
BUN / CREAT RATIO: 19 (ref 10–24)
BUN: 17 mg/dL (ref 8–27)
CHLORIDE: 101 mmol/L (ref 96–106)
CO2: 25 mmol/L (ref 18–29)
Calcium: 9.3 mg/dL (ref 8.6–10.2)
Creatinine, Ser: 0.91 mg/dL (ref 0.76–1.27)
GFR calc non Af Amer: 90 mL/min/{1.73_m2} (ref >59)
GFR, EST AFRICAN AMERICAN: 104 mL/min/{1.73_m2} (ref >59)
GLOBULIN, TOTAL: 3 g/dL (ref 1.5–4.5)
Glucose: 106 mg/dL — ABNORMAL HIGH (ref 65–99)
Potassium: 4.4 mmol/L (ref 3.5–5.2)
SODIUM: 141 mmol/L (ref 134–144)
Total Protein: 7.2 g/dL (ref 6.0–8.5)

## 2016-10-25 LAB — LIPID PANEL W/O CHOL/HDL RATIO
CHOLESTEROL TOTAL: 150 mg/dL (ref 100–199)
HDL: 38 mg/dL — ABNORMAL LOW (ref >39)
LDL CALC: 70 mg/dL (ref 0–99)
Triglycerides: 212 mg/dL — ABNORMAL HIGH (ref 0–149)
VLDL Cholesterol Cal: 42 mg/dL — ABNORMAL HIGH (ref 5–40)

## 2016-11-22 ENCOUNTER — Ambulatory Visit (INDEPENDENT_AMBULATORY_CARE_PROVIDER_SITE_OTHER): Payer: Medicare Other | Admitting: Family Medicine

## 2016-11-22 ENCOUNTER — Encounter: Payer: Self-pay | Admitting: Family Medicine

## 2016-11-22 VITALS — BP 104/69 | HR 84 | Temp 98.4°F | Wt 315.4 lb

## 2016-11-22 DIAGNOSIS — Z952 Presence of prosthetic heart valve: Secondary | ICD-10-CM | POA: Diagnosis not present

## 2016-11-22 LAB — COAGUCHEK XS/INR WAIVED
INR: 4.8 — ABNORMAL HIGH (ref 0.9–1.1)
Prothrombin Time: 57.3 s

## 2016-11-22 MED ORDER — WARFARIN SODIUM 3 MG PO TABS: 3.0000 mg | ORAL_TABLET | Freq: Every day | ORAL | 12 refills | Status: DC

## 2016-11-22 NOTE — Progress Notes (Signed)
BP 104/69 (BP Location: Left Arm, Patient Position: Sitting, Cuff Size: Large)   Pulse 84   Temp 98.4 F (36.9 C)   Wt (!) 315 lb 6.4 oz (143.1 kg)   SpO2 98%   BMI 50.91 kg/m    Subjective:    Patient ID: Roberto [name], male    DOB: [birth year], 61 y.o.   MRN: [id number]  CC: Coumadin management  HPI: This patient is a 61 y.o. male who presents for coumadin management. The expected duration of coumadin treatment is lifelong The reason for anticoagulation is  mechanical heart valve.  Present Coumadin dose: 7.5, 4, 4 Goal: 2.5-3.5  Excessive bruising: no Nose bleeding: no Rectal bleeding: no Prolonged menstrual cycles: N/A Eating diet with consistent amounts of foods containing Vitamin K:yes Any recent antibiotic use? no  Relevant past medical, surgical, family and social history reviewed and updated as indicated. Interim medical history since our last visit reviewed. Allergies and medications reviewed and updated.  ROS: Per HPI unless specifically indicated above     Objective:    BP 104/69 (BP Location: Left Arm, Patient Position: Sitting, Cuff Size: Large)   Pulse 84   Temp 98.4 F (36.9 C)   Wt (!) 315 lb 6.4 oz (143.1 kg)   SpO2 98%   BMI 50.91 kg/m   Wt Readings from Last 3 Encounters:  11/22/16 (!) 315 lb 6.4 oz (143.1 kg)  10/24/16 (!) 321 lb 8 oz (145.8 kg)  09/28/16 (!) 329 lb (149.2 kg)     General: Well appearing, well nourished in no distress.  Normal mood and affect. Skin: No excessive bruising or rash  Last INR: 4.8 Last PT: 57.3    Last CBC:  Lab Results  Component Value Date   WBC 6.2 10/24/2016   HGB 13.7 05/06/2016   HCT 46.7 10/24/2016   MCV 87 10/24/2016   PLT 252 10/24/2016    Results for orders placed or performed in visit on 10/24/16  CBC with Differential/Platelet  Result Value Ref Range   WBC 6.2 3.4 - 10.8 x10E3/uL   RBC 5.40 4.14 - 5.80 x10E6/uL   Hemoglobin 15.7 13.0 - 17.7 g/dL   Hematocrit 46.7 37.5 - 51.0 %   MCV 87 79 - 97 fL   MCH 29.1 26.6 - 33.0 pg   MCHC 33.6 31.5 - 35.7 g/dL   RDW 15.4 12.3 - 15.4 %   Platelets 252 150 - 379 x10E3/uL   Neutrophils 54 Not Estab. %   Lymphs 34 Not Estab. %   Monocytes 8 Not Estab. %   Eos 3 Not Estab. %   Basos 1 Not Estab. %   Neutrophils Absolute 3.4 1.4 - 7.0 x10E3/uL   Lymphocytes Absolute 2.1 0.7 - 3.1 x10E3/uL   Monocytes Absolute 0.5 0.1 - 0.9 x10E3/uL   EOS (ABSOLUTE) 0.2 0.0 - 0.4 x10E3/uL   Basophils Absolute 0.0 0.0 - 0.2 x10E3/uL   Immature Granulocytes 0 Not Estab. %   Immature Grans (Abs) 0.0 0.0 - 0.1 x10E3/uL  Comprehensive metabolic panel  Result Value Ref Range   Glucose 106 (H) 65 - 99 mg/dL   BUN 17 8 - 27 mg/dL   Creatinine, Ser 0.91 0.76 - 1.27 mg/dL   GFR calc non Af Amer 90 >59 mL/min/1.73   GFR calc Af Amer 104 >59 mL/min/1.73   BUN/Creatinine Ratio 19 10 - 24   Sodium 141 134 - 144 mmol/L   Potassium 4.4 3.5 - 5.2 mmol/L   Chloride 101 96 -  106 mmol/L   CO2 25 18 - 29 mmol/L   Calcium 9.3 8.6 - 10.2 mg/dL   Total Protein 7.2 6.0 - 8.5 g/dL   Albumin 4.2 3.6 - 4.8 g/dL   Globulin, Total 3.0 1.5 - 4.5 g/dL   Albumin/Globulin Ratio 1.4 1.2 - 2.2   Bilirubin Total 0.3 0.0 - 1.2 mg/dL   Alkaline Phosphatase 61 39 - 117 IU/L   AST 24 0 - 40 IU/L   ALT 27 0 - 44 IU/L  Lipid Panel w/o Chol/HDL Ratio  Result Value Ref Range   Cholesterol, Total 150 100 - 199 mg/dL   Triglycerides 212 (H) 0 - 149 mg/dL   HDL 38 (L) >39 mg/dL   VLDL Cholesterol Cal 42 (H) 5 - 40 mg/dL   LDL Calculated 70 0 - 99 mg/dL  Microalbumin, Urine Waived  Result Value Ref Range   Microalb, Ur Waived 10 0 - 19 mg/L   Creatinine, Urine Waived 50 10 - 300 mg/dL   Microalb/Creat Ratio <30 <30 mg/g  CoaguChek XS/INR Waived  Result Value Ref Range   INR 2.6 (H) 0.9 - 1.1   Prothrombin Time 31.6 sec  Bayer DCA Hb A1c Waived  Result Value Ref Range   Bayer DCA Hb A1c Waived 6.0 <7.0 %       Assessment:     ICD-9-CM ICD-10-CM   1. Mechanical  heart valve present V43.3 Z95.2 CoaguChek XS/INR Waived    Plan:   Discussed current plan face-to-face with patient. For coumadin dosing, elected to change dose to 7mg , 4mg , 4mg  alternating and will add some salads. Will plan to recheck INR in 1 week.

## 2016-11-29 ENCOUNTER — Encounter: Payer: Self-pay | Admitting: Family Medicine

## 2016-11-29 ENCOUNTER — Ambulatory Visit (INDEPENDENT_AMBULATORY_CARE_PROVIDER_SITE_OTHER): Payer: Medicare Other | Admitting: Family Medicine

## 2016-11-29 VITALS — BP 110/73 | HR 70 | Temp 98.0°F | Wt 316.7 lb

## 2016-11-29 DIAGNOSIS — Z952 Presence of prosthetic heart valve: Secondary | ICD-10-CM | POA: Diagnosis not present

## 2016-11-29 LAB — COAGUCHEK XS/INR WAIVED
INR: 4.1 — AB (ref 0.9–1.1)
Prothrombin Time: 49.7 s

## 2016-11-29 MED ORDER — CYCLOBENZAPRINE HCL 10 MG PO TABS: 10.0000 mg | ORAL_TABLET | Freq: Every day | ORAL | 1 refills | Status: DC

## 2016-11-29 NOTE — Progress Notes (Signed)
   BP 110/73 (BP Location: Left Arm, Patient Position: Sitting, Cuff Size: Large)   Pulse 70   Temp 98 F (36.7 C)   Wt (!) 316 lb 11.2 oz (143.7 kg)   SpO2 97%   BMI 51.12 kg/m    Subjective:    Patient ID: Roberto [name], male    DOB: Oct 21, 1955, 61 y.o.   MRN: [id number]  CC: Coumadin management  HPI: This patient is a 61 y.o. male who presents for coumadin management. The expected duration of coumadin treatment is lifelong The reason for anticoagulation is  mechanical heart valve.  Present Coumadin dose: 7mg , 4mg , 4mg  alternating Goal: 2.5-3.5  Excessive bruising: no Nose bleeding: no Rectal bleeding: no Prolonged menstrual cycles: N/A Eating diet with consistent amounts of foods containing Vitamin K:yes- hasn't been eating as much greens as he was planning this week Any recent antibiotic use? no  Relevant past medical, surgical, family and social history reviewed and updated as indicated. Interim medical history since our last visit reviewed. Allergies and medications reviewed and updated.  ROS: Per HPI unless specifically indicated above     Objective:    BP 110/73 (BP Location: Left Arm, Patient Position: Sitting, Cuff Size: Large)   Pulse 70   Temp 98 F (36.7 C)   Wt (!) 316 lb 11.2 oz (143.7 kg)   SpO2 97%   BMI 51.12 kg/m   Wt Readings from Last 3 Encounters:  11/29/16 (!) 316 lb 11.2 oz (143.7 kg)  11/22/16 (!) 315 lb 6.4 oz (143.1 kg)  10/24/16 (!) 321 lb 8 oz (145.8 kg)     General: Well appearing, well nourished in no distress.  Normal mood and affect. Skin: No excessive bruising or rash  Last INR: 4.1 Last PT: 49.7     Last CBC:  Lab Results  Component Value Date   WBC 6.2 10/24/2016   HGB 13.7 05/06/2016   HCT 46.7 10/24/2016   MCV 87 10/24/2016   PLT 252 10/24/2016    Results for orders placed or performed in visit on 11/22/16  CoaguChek XS/INR Waived  Result Value Ref Range   INR 4.8 (H) 0.9 - 1.1   Prothrombin Time 57.3 sec       Assessment:     ICD-9-CM ICD-10-CM   1. Mechanical heart valve present V43.3 Z95.2 CoaguChek XS/INR Waived    Plan:   Discussed current plan face-to-face with patient. For coumadin dosing, elected to change dose to 6mg , 4mg , 4mg  alternating. Will plan to recheck INR in 1 week.

## 2016-12-07 ENCOUNTER — Ambulatory Visit (INDEPENDENT_AMBULATORY_CARE_PROVIDER_SITE_OTHER): Payer: Medicare Other | Admitting: Family Medicine

## 2016-12-07 ENCOUNTER — Encounter: Payer: Self-pay | Admitting: Family Medicine

## 2016-12-07 VITALS — BP 113/77 | HR 70 | Temp 97.9°F | Wt 315.5 lb

## 2016-12-07 DIAGNOSIS — Z952 Presence of prosthetic heart valve: Secondary | ICD-10-CM | POA: Diagnosis not present

## 2016-12-07 LAB — COAGUCHEK XS/INR WAIVED
INR: 3.3 — AB (ref 0.9–1.1)
Prothrombin Time: 39.4 s

## 2016-12-07 NOTE — Progress Notes (Signed)
   BP 113/77 (BP Location: Left Arm, Patient Position: Sitting, Cuff Size: Large)   Pulse 70   Temp 97.9 F (36.6 C)   Wt (!) 315 lb 8 oz (143.1 kg)   SpO2 98%   BMI 50.92 kg/m    Subjective:    Patient ID: Roberto [name], male    DOB: [birth year], 61 y.o.   MRN: [id number]  CC: Coumadin management  HPI: This patient is a 61 y.o. male who presents for coumadin management. The expected duration of coumadin treatment is lifelong The reason for anticoagulation is  mechanical heart valve.  Present Coumadin dose: 4, 4, 6 repeat Goal: 2.5-3.5  Excessive bruising: no Nose bleeding: no Rectal bleeding: no Prolonged menstrual cycles: N/A Eating diet with consistent amounts of foods containing Vitamin K:yes Any recent antibiotic use? no  Relevant past medical, surgical, family and social history reviewed and updated as indicated. Interim medical history since our last visit reviewed. Allergies and medications reviewed and updated.  ROS: Per HPI unless specifically indicated above     Objective:    BP 113/77 (BP Location: Left Arm, Patient Position: Sitting, Cuff Size: Large)   Pulse 70   Temp 97.9 F (36.6 C)   Wt (!) 315 lb 8 oz (143.1 kg)   SpO2 98%   BMI 50.92 kg/m   Wt Readings from Last 3 Encounters:  12/07/16 (!) 315 lb 8 oz (143.1 kg)  11/29/16 (!) 316 lb 11.2 oz (143.7 kg)  11/22/16 (!) 315 lb 6.4 oz (143.1 kg)     General: Well appearing, well nourished in no distress.  Normal mood and affect. Skin: No excessive bruising or rash  Last INR: 3.3 Last PT: 39.4    Last CBC:  Lab Results  Component Value Date   WBC 6.2 10/24/2016   HGB 15.7 10/24/2016   HCT 46.7 10/24/2016   MCV 87 10/24/2016   PLT 252 10/24/2016    Results for orders placed or performed in visit on 11/29/16  CoaguChek XS/INR Waived  Result Value Ref Range   INR 4.1 (H) 0.9 - 1.1   Prothrombin Time 49.7 sec       Assessment:     ICD-10-CM   1. Mechanical heart valve present Z95.2  CoaguChek XS/INR Waived    Plan:   Discussed current plan face-to-face with patient. For coumadin dosing, elected to continue current dose. Will plan to recheck INR in 2 weeks.

## 2016-12-19 ENCOUNTER — Telehealth: Payer: Self-pay

## 2016-12-19 NOTE — Telephone Encounter (Signed)
I did not increase that to 1 BID- I can if he needs me to, but he cannot drive on that medicine.

## 2016-12-19 NOTE — Telephone Encounter (Signed)
Patient states that his his flexeril was increased to one bid, if this is correct can you please send a new prescription to [facility]

## 2016-12-20 ENCOUNTER — Encounter: Payer: Self-pay | Admitting: Internal Medicine

## 2016-12-20 ENCOUNTER — Ambulatory Visit (INDEPENDENT_AMBULATORY_CARE_PROVIDER_SITE_OTHER): Payer: Medicare Other | Admitting: Internal Medicine

## 2016-12-20 VITALS — BP 110/78 | HR 75 | Ht 66.0 in | Wt 315.2 lb

## 2016-12-20 DIAGNOSIS — I5022 Chronic systolic (congestive) heart failure: Secondary | ICD-10-CM | POA: Diagnosis not present

## 2016-12-20 DIAGNOSIS — Z952 Presence of prosthetic heart valve: Secondary | ICD-10-CM

## 2016-12-20 DIAGNOSIS — I428 Other cardiomyopathies: Secondary | ICD-10-CM | POA: Diagnosis not present

## 2016-12-20 DIAGNOSIS — E785 Hyperlipidemia, unspecified: Secondary | ICD-10-CM

## 2016-12-20 MED ORDER — METOPROLOL SUCCINATE ER 100 MG PO TB24: 100.0000 mg | ORAL_TABLET | Freq: Every day | ORAL | 3 refills | Status: DC

## 2016-12-20 NOTE — Telephone Encounter (Signed)
[name] called back and would like a return call at your earliest convenience.

## 2016-12-20 NOTE — Telephone Encounter (Signed)
[name] called back regarding the Flexeril.  She states patient does need the flexeril twice daily as this does help his spasms by keeping them under control.  She states he does not drive on this medication.  They may be here in the morning but I did tell [name] I would send this message to Dr Wynetta Emery.  Thanks

## 2016-12-20 NOTE — Progress Notes (Signed)
Follow-up Outpatient Visit Date: 12/20/2016  Primary Care Provider: Valerie Roys, DO [address] [place]  [id number]  Chief Complaint: Follow-up mechanical aortic valve and chronic systolic heart failure  HPI:  Roberto [name] is a 61 y.o. year-old male with history of mechanical aortic valve replacement, stroke, right renal infarct, chronic systolic heart failure (LVEF 40-45%), and hyperlipidemia, who presents for follow-up of chronic systolic heart failure and mechanical aortic valve. I last saw him on 09/27/16, at which time he was doing relatively well. Since then, he has continued to exercise regularly and has lost about 16 pounds. He reports that his functional status is improving; he is now able to mow his lawn without needing to stop to rest. He has not had any chest pain, shortness of breath, or edema. He is now using furosemide just once a day. He is tolerating increased dose of lisinopril well. He had some mild dizziness for the first few days, but reports that this has since resolved. He is walking and hour a day on the treadmill, 4 days a week. His INR has been somewhat supratherapeutic, though it is trending back to the normal range. He has not had any significant bleeding or focal neurologic changes.  -------------------------------------------------------------------------------------------------- Cardiovascular History & Procedures: Cardiovascular Problems:  Bicuspid aortic valve status post aortic valve replacement (08/21/73)  Chronic systolic heart failure  Stroke  Renal infarct  Risk Factors:  Hyperlipidemia, stroke  Cath/PCI:  LHC/RHC (03/21/10, [place], [place]): No significant CAD. LVEDP 31 mmHg. Mean AoV gradient 34 mmHg at rest and 47 mmHg with dobutamine 20 mcg/kg/min. AVA 1.0 cm^2. RA 31, RV 68/25, PA 68/47, PCWP 38. PA sat 65%. CO 6.2 L/min (Fick) and 5.3 L/min (thermodilution).  CV Surgery:  Mechanical aortic valve replacement and  ascending aortoplasty (03/25/10,  [place], [place]): #27 [employer]omed[employer] mechanical valve.  EP Procedures and Devices:  None available  Non-Invasive Evaluation(s):  Transthoracic echocardiogram (06/05/16): Technically difficult study.  Moderately dilated left ventricle with moderate LVH.  Moderately reduced LV contraction (EF 40-45%).  AVR well-seated with 14 mmHg gradient.  Peak AV velocity 2.5 m/s. Mild mitral valve thickening with mild MR.  Mildly dilated RV with mildly reduced contraction.  Transthoracic echocardiogram (05/13/14, [facility]): Mild LVH with LVEF 35-40% and global hypokinesis. Mechanical aortic valve present with trace regurgitation. Normal LVOT gradient. Mild TR. Mild pulmonary hypertension. Normal RV size and function.  Recent CV Pertinent Labs: Lab Results  Component Value Date   CHOL 150 10/24/2016   CHOL 193 04/19/2016   CHOL 131 05/25/2014   HDL 38 (L) 10/24/2016   HDL 30 (L) 05/25/2014   LDLCALC 70 10/24/2016   LDLCALC 68 05/25/2014   TRIG 212 (H) 10/24/2016   TRIG 371 (H) 04/19/2016   TRIG 167 05/25/2014   INR 3.3 (H) 12/07/2016   INR 1.9 05/25/2014   K 4.4 10/24/2016   K 3.6 05/25/2014   BUN 17 10/24/2016   BUN 15 05/25/2014   CREATININE 0.91 10/24/2016   CREATININE 0.85 05/25/2014    Past medical and surgical history were reviewed and updated in EPIC.  Outpatient Encounter Prescriptions as of 12/20/2016  Medication Sig  . albuterol (PROVENTIL HFA;VENTOLIN HFA) 108 (90 Base) MCG/ACT inhaler Inhale 2 puffs into the lungs every 6 (six) hours as needed for wheezing or shortness of breath.  Marland Kitchen aspirin 81 MG chewable tablet Chew 81 mg by mouth daily.    . cyclobenzaprine (FLEXERIL) 10 MG tablet Take 1  tablet (10 mg total) by mouth at bedtime.  . Fluticasone-Salmeterol (ADVAIR) 250-50 MCG/DOSE AEPB Inhale 1 puff into the lungs 2 (two) times daily.  . furosemide (LASIX) 40 MG tablet Take 1 tablet (40 mg total) by mouth daily.    Marland Kitchen lisinopril (PRINIVIL,ZESTRIL) 5 MG tablet Take 1 tablet (5 mg total) by mouth daily.  . metoprolol succinate (TOPROL-XL) 50 MG 24 hr tablet Take 1 tablet (50 mg total) by mouth daily. Take with or immediately following a meal.  . simvastatin (ZOCOR) 40 MG tablet TAKE 1 TABLET (40 MG TOTAL) BY MOUTH AT BEDTIME.  Marland Kitchen tiotropium (SPIRIVA) 18 MCG inhalation capsule Place 18 mcg into inhaler and inhale daily.  Marland Kitchen warfarin (COUMADIN) 3 MG tablet Take 1 tablet (3 mg total) by mouth daily.  Marland Kitchen warfarin (COUMADIN) 4 MG tablet Take 1 tablet (4 mg total) by mouth daily.   No facility-administered encounter medications on file as of 12/20/2016.     Allergies: Patient has no known allergies.  Social History   Social History  . Marital status: Single    Spouse name: N/A  . Number of children: 2  . Years of education: N/A   Occupational History  . Disabled Unemployed   Social History Main Topics  . Smoking status: Former Smoker    Packs/day: 2.00    Years: 40.00    Types: Cigarettes    Quit date: 12/02/2015  . Smokeless tobacco: Never Used  . Alcohol use No  . Drug use: No  . Sexual activity: No   Other Topics Concern  . Not on file   Social History Narrative  . No narrative on file    Family History  Problem Relation Age of Onset  . Arthritis Mother   . Cancer Mother        colon  . Arthritis Father   . Diabetes Father   . Stroke Father   . Cancer Father        colon  . Cancer Brother        brain  . Cancer Sister        breast  . Heart disease Neg Hx     Review of Systems: A 12-system review of systems was performed and was negative except as noted in the HPI.  --------------------------------------------------------------------------------------------------  Physical Exam: BP 110/78 (BP Location: Right Arm, Patient Position: Sitting, Cuff Size: Large)   Pulse 75   Ht 5\' 6"  (1.676 m)   Wt (!) 315 lb 4 oz (143 kg)   BMI 50.88 kg/m   General:  Morbidly obese man,  seated comfortably in the exam room. He is accompanied by his wife. HEENT: No conjunctival pallor or scleral icterus.  Moist mucous membranes.  OP clear. Neck: Supple without lymphadenopathy, thyromegaly, JVD, or HJR. Lungs: Normal work of breathing.  Clear to auscultation bilaterally without wheezes or crackles. Heart: Regular rate and rhythm with mechanical S2 noted. No murmurs Abd: Bowel sounds present.  Obese, soft, nontender, nondistended. Unable to assess HSM due to body habitus. Ext: No lower extremity edema.  Radial, PT, and DP pulses are 2+ bilaterally. Skin: Warm and dry without rash.  Lab Results  Component Value Date   WBC 6.2 10/24/2016   HGB 15.7 10/24/2016   HCT 46.7 10/24/2016   MCV 87 10/24/2016   PLT 252 10/24/2016    Lab Results  Component Value Date   NA 141 10/24/2016   K 4.4 10/24/2016   CL 101 10/24/2016   CO2 25 10/24/2016  BUN 17 10/24/2016   CREATININE 0.91 10/24/2016   GLUCOSE 106 (H) 10/24/2016   ALT 27 10/24/2016    Lab Results  Component Value Date   CHOL 150 10/24/2016   HDL 38 (L) 10/24/2016   LDLCALC 70 10/24/2016   TRIG 212 (H) 10/24/2016   --------------------------------------------------------------------------------------------------  ASSESSMENT AND PLAN: Chronic systolic heart failure secondary to non-ischemic cardiomyopathy Mr. Schmader continues to do well, with improving functional capacity. He has also lost 16 pounds since her last visit. He tolerated escalation of lisinopril well. We have discussed the importance of maximizing his evidence-based heart failure therapy. We will therefore increase metoprolol succinate to 100 mg daily today. I will make any changes to lisinopril or furosemide at this time. I encouraged him to continue with exercise and weight loss.  Aortic stenosis status post mechanical AVR Mr. Drost does not have any symptoms to suggest valve dysfunction. Mechanical S2 is noted on exam today. He should continue  with low-dose aspirin and warfarin, which is managed by Dr. Durenda Age office.  Hypertension Blood pressure is well controlled today. We will increase metoprolol, as above.  Hyperlipidemia Mr. Andujo is tolerating simvastatin well. I will defer further management to Dr. Wynetta Emery.  Follow-up: Return to clinic in 4 months.  Nelva Bush, MD 12/20/2016 8:13 AM

## 2016-12-20 NOTE — Patient Instructions (Signed)
Medication Instructions:  Your physician has recommended you make the following change in your medication:  1- INCREASE Metoprolol succinate to 100 mg by mouth once a day.   Labwork: none  Testing/Procedures: none  Follow-Up: Your physician recommends that you schedule a follow-up appointment in: 4 MONTHS WITH DR END.  If you need a refill on your cardiac medications before your next appointment, please call your pharmacy.

## 2016-12-20 NOTE — Telephone Encounter (Signed)
Left message on machine for pt to return call to the office.  

## 2016-12-20 NOTE — Telephone Encounter (Signed)
Called and left a message for patient's friend [name] to return my call.

## 2016-12-21 ENCOUNTER — Ambulatory Visit (INDEPENDENT_AMBULATORY_CARE_PROVIDER_SITE_OTHER): Payer: Medicare Other | Admitting: Family Medicine

## 2016-12-21 ENCOUNTER — Encounter: Payer: Self-pay | Admitting: Family Medicine

## 2016-12-21 VITALS — BP 113/74 | HR 74 | Temp 98.3°F | Wt 311.4 lb

## 2016-12-21 DIAGNOSIS — Z952 Presence of prosthetic heart valve: Secondary | ICD-10-CM

## 2016-12-21 LAB — COAGUCHEK XS/INR WAIVED
INR: 3.1 — ABNORMAL HIGH (ref 0.9–1.1)
PROTHROMBIN TIME: 37.7 s

## 2016-12-21 MED ORDER — CYCLOBENZAPRINE HCL 10 MG PO TABS: 10.0000 mg | ORAL_TABLET | Freq: Two times a day (BID) | ORAL | 3 refills | Status: DC | PRN

## 2016-12-21 NOTE — Telephone Encounter (Signed)
Rx sent to his pharmacy

## 2016-12-21 NOTE — Progress Notes (Signed)
   BP 113/74 (BP Location: Left Arm, Patient Position: Sitting, Cuff Size: Large)   Pulse 74   Temp 98.3 F (36.8 C)   Wt (!) 311 lb 6 oz (141.2 kg)   SpO2 95%   BMI 50.26 kg/m    Subjective:    Patient ID: Roberto [name], male    DOB: Jan 30, 1956, 61 y.o.   MRN: [id number]  CC: Coumadin management  HPI: This patient is a 61 y.o. male who presents for coumadin management. The expected duration of coumadin treatment is lifelong The reason for anticoagulation is  mechanical heart valve.  Present Coumadin dose: 4, 4, 6 repeat Goal: 2.5-3.5  Excessive bruising: no Nose bleeding: no Rectal bleeding: no Prolonged menstrual cycles: N/A Eating diet with consistent amounts of foods containing Vitamin K:yes Any recent antibiotic use? no  Relevant past medical, surgical, family and social history reviewed and updated as indicated. Interim medical history since our last visit reviewed. Allergies and medications reviewed and updated.  ROS: Per HPI unless specifically indicated above     Objective:    BP 113/74 (BP Location: Left Arm, Patient Position: Sitting, Cuff Size: Large)   Pulse 74   Temp 98.3 F (36.8 C)   Wt (!) 311 lb 6 oz (141.2 kg)   SpO2 95%   BMI 50.26 kg/m   Wt Readings from Last 3 Encounters:  12/21/16 (!) 311 lb 6 oz (141.2 kg)  12/20/16 (!) 315 lb 4 oz (143 kg)  12/07/16 (!) 315 lb 8 oz (143.1 kg)     General: Well appearing, well nourished in no distress.  Normal mood and affect. Skin: No excessive bruising or rash  Last INR: 3.1 Last PT: 37.7     Last CBC:  Lab Results  Component Value Date   WBC 6.2 10/24/2016   HGB 15.7 10/24/2016   HCT 46.7 10/24/2016   MCV 87 10/24/2016   PLT 252 10/24/2016    Results for orders placed or performed in visit on 12/07/16  CoaguChek XS/INR Waived  Result Value Ref Range   INR 3.3 (H) 0.9 - 1.1   Prothrombin Time 39.4 sec       Assessment:     ICD-10-CM   1. Mechanical heart valve present Z95.2  CoaguChek XS/INR Waived    Plan:   Discussed current plan face-to-face with patient. For coumadin dosing, elected to continue current dose. Will plan to recheck INR in 1 month.

## 2017-01-02 DIAGNOSIS — R600 Localized edema: Secondary | ICD-10-CM | POA: Diagnosis not present

## 2017-01-02 DIAGNOSIS — N280 Ischemia and infarction of kidney: Secondary | ICD-10-CM | POA: Diagnosis not present

## 2017-01-02 DIAGNOSIS — I10 Essential (primary) hypertension: Secondary | ICD-10-CM | POA: Diagnosis not present

## 2017-01-02 DIAGNOSIS — N179 Acute kidney failure, unspecified: Secondary | ICD-10-CM | POA: Diagnosis not present

## 2017-01-18 ENCOUNTER — Ambulatory Visit: Payer: Medicare Other | Admitting: Family Medicine

## 2017-01-25 ENCOUNTER — Encounter: Payer: Self-pay | Admitting: Family Medicine

## 2017-01-25 ENCOUNTER — Ambulatory Visit (INDEPENDENT_AMBULATORY_CARE_PROVIDER_SITE_OTHER): Payer: Medicare Other | Admitting: Family Medicine

## 2017-01-25 VITALS — BP 107/68 | HR 64 | Temp 97.9°F | Wt 313.0 lb

## 2017-01-25 DIAGNOSIS — Z952 Presence of prosthetic heart valve: Secondary | ICD-10-CM | POA: Diagnosis not present

## 2017-01-25 NOTE — Progress Notes (Signed)
   BP 107/68 (BP Location: Left Arm, Patient Position: Sitting, Cuff Size: Large)   Pulse 64   Temp 97.9 F (36.6 C)   Wt (!) 313 lb (142 kg)   SpO2 99%   BMI 50.52 kg/m    Subjective:    Patient ID: Roberto [name], male    DOB: Jul 29, 1955, 61 y.o.   MRN: [id number]  CC: Coumadin management  HPI: This patient is a 61 y.o. male who presents for coumadin management. The expected duration of coumadin treatment is lifelong The reason for anticoagulation is  mechanical heart valve.  Present Coumadin dose: 4, 4, 6 repeat Goal: 2.5-3.5  Excessive bruising: no Nose bleeding: no Rectal bleeding: no Prolonged menstrual cycles: N/A Eating diet with consistent amounts of foods containing Vitamin K:yes Any recent antibiotic use? no  Relevant past medical, surgical, family and social history reviewed and updated as indicated. Interim medical history since our last visit reviewed. Allergies and medications reviewed and updated.  ROS: Per HPI unless specifically indicated above     Objective:    BP 107/68 (BP Location: Left Arm, Patient Position: Sitting, Cuff Size: Large)   Pulse 64   Temp 97.9 F (36.6 C)   Wt (!) 313 lb (142 kg)   SpO2 99%   BMI 50.52 kg/m   Wt Readings from Last 3 Encounters:  01/25/17 (!) 313 lb (142 kg)  12/21/16 (!) 311 lb 6 oz (141.2 kg)  12/20/16 (!) 315 lb 4 oz (143 kg)     General: Well appearing, well nourished in no distress.  Normal mood and affect. Skin: No excessive bruising or rash  Last INR: 3.0 Lat PT: 36.1    Last CBC:  Lab Results  Component Value Date   WBC 6.2 10/24/2016   HGB 15.7 10/24/2016   HCT 46.7 10/24/2016   MCV 87 10/24/2016   PLT 252 10/24/2016    Results for orders placed or performed in visit on 12/21/16  CoaguChek XS/INR Waived  Result Value Ref Range   INR 3.1 (H) 0.9 - 1.1   Prothrombin Time 37.7 sec       Assessment:     ICD-10-CM   1. Mechanical heart valve present Z95.2 CoaguChek XS/INR Waived     Plan:   Discussed current plan face-to-face with patient. For coumadin dosing, elected to continue current dose. Will plan to recheck INR in 1 month.

## 2017-01-30 LAB — COAGUCHEK XS/INR WAIVED
INR: 3 — ABNORMAL HIGH (ref 0.9–1.1)
PROTHROMBIN TIME: 36.1 s

## 2017-02-27 ENCOUNTER — Ambulatory Visit (INDEPENDENT_AMBULATORY_CARE_PROVIDER_SITE_OTHER): Payer: Medicare Other | Admitting: Unknown Physician Specialty

## 2017-02-27 ENCOUNTER — Ambulatory Visit (INDEPENDENT_AMBULATORY_CARE_PROVIDER_SITE_OTHER): Payer: Medicare Other | Admitting: Vascular Surgery

## 2017-02-27 ENCOUNTER — Encounter: Payer: Self-pay | Admitting: Unknown Physician Specialty

## 2017-02-27 VITALS — BP 127/83 | HR 72 | Temp 98.3°F | Ht 66.0 in | Wt 313.0 lb

## 2017-02-27 DIAGNOSIS — Z024 Encounter for examination for driving license: Secondary | ICD-10-CM

## 2017-02-27 NOTE — Progress Notes (Signed)
   BP 127/83   Pulse 72   Temp 98.3 F (36.8 C)   Ht 5\' 6"  (1.676 m)   Wt (!) 313 lb (142 kg)   SpO2 97%   BMI 50.52 kg/m    Subjective:    Patient ID: Roberto [name], male    DOB: 04-25-1956, 61 y.o.   MRN: [id number]  HPI: Roberto [name] is a 61 y.o. male  Chief Complaint  Patient presents with  . DOT Physical   Pt with history of non-ischemic cardiomyopathy secondary to valve replacement.  Meet the following critere: No history of cardiac arrest No spontaneous sustained VT Normal exercise BP (e.g., no decrease at maximal exercise) No non-sustained VT No family history of premature sudden death No syncope Left ventricular (LV) septum thickness <36mm  Warfarin therapy Monitored monthly.    COPD Rare inhaler use.  Stopped smoking  Relevant past medical, surgical, family and social history reviewed and updated as indicated. Interim medical history since our last visit reviewed. Allergies and medications reviewed and updated.  Review of Systems  Per HPI unless specifically indicated above     Objective:    BP 127/83   Pulse 72   Temp 98.3 F (36.8 C)   Ht 5\' 6"  (1.676 m)   Wt (!) 313 lb (142 kg)   SpO2 97%   BMI 50.52 kg/m   Wt Readings from Last 3 Encounters:  02/27/17 (!) 313 lb (142 kg)  01/25/17 (!) 313 lb (142 kg)  12/21/16 (!) 311 lb 6 oz (141.2 kg)    Physical Exam  Constitutional: He is oriented to person, place, and time. He appears well-developed and well-nourished. No distress.  HENT:  Head: Normocephalic and atraumatic.  Eyes: Conjunctivae and lids are normal. Right eye exhibits no discharge. Left eye exhibits no discharge. No scleral icterus.  Neck: Normal range of motion. Neck supple. No JVD present. Carotid bruit is not present.  Cardiovascular: Normal rate, regular rhythm and normal heart sounds.   Pulmonary/Chest: Effort normal and breath sounds normal. No respiratory distress.  Abdominal: Normal appearance. There is no splenomegaly  or hepatomegaly.  Musculoskeletal: Normal range of motion.  Neurological: He is alert and oriented to person, place, and time.  Skin: Skin is warm, dry and intact. No rash noted. No pallor.  Psychiatric: He has a normal mood and affect. His behavior is normal. Judgment and thought content normal.    Results for orders placed or performed in visit on 01/25/17  CoaguChek XS/INR Waived  Result Value Ref Range   INR 3.0 (H) 0.9 - 1.1   Prothrombin Time 36.1 sec      Assessment & Plan:   Problem List Items Addressed This Visit    None    Visit Diagnoses    Encounter for driver's license history and physical    -  Primary      See form  Follow up plan: Return for 1 year.

## 2017-03-01 ENCOUNTER — Ambulatory Visit (INDEPENDENT_AMBULATORY_CARE_PROVIDER_SITE_OTHER): Payer: Medicare Other | Admitting: Family Medicine

## 2017-03-01 ENCOUNTER — Encounter: Payer: Self-pay | Admitting: Family Medicine

## 2017-03-01 VITALS — BP 110/74 | HR 72 | Temp 98.4°F | Wt 313.5 lb

## 2017-03-01 DIAGNOSIS — Z952 Presence of prosthetic heart valve: Secondary | ICD-10-CM | POA: Diagnosis not present

## 2017-03-01 LAB — COAGUCHEK XS/INR WAIVED
INR: 3.5 — AB (ref 0.9–1.1)
PROTHROMBIN TIME: 41.7 s

## 2017-03-01 NOTE — Progress Notes (Signed)
   BP 110/74 (BP Location: Left Arm, Patient Position: Sitting, Cuff Size: Large)   Pulse 72   Temp 98.4 F (36.9 C)   Wt (!) 313 lb 8 oz (142.2 kg)   SpO2 95%   BMI 50.60 kg/m    Subjective:    Patient ID: Roberto [name], male    DOB: May 17, 1956, 61 y.o.   MRN: [id number]  CC: Coumadin management  HPI: This patient is a 61 y.o. male who presents for coumadin management. The expected duration of coumadin treatment is lifelong The reason for anticoagulation is  mechanical heart valve.  Present Coumadin dose: 4, 4, 6 repeat Goal: 2.5-3.5  Excessive bruising: no Nose bleeding: no Rectal bleeding: no Prolonged menstrual cycles: N/A Eating diet with consistent amounts of foods containing Vitamin K:yes Any recent antibiotic use? no  Relevant past medical, surgical, family and social history reviewed and updated as indicated. Interim medical history since our last visit reviewed. Allergies and medications reviewed and updated.  ROS: Per HPI unless specifically indicated above     Objective:    BP 110/74 (BP Location: Left Arm, Patient Position: Sitting, Cuff Size: Large)   Pulse 72   Temp 98.4 F (36.9 C)   Wt (!) 313 lb 8 oz (142.2 kg)   SpO2 95%   BMI 50.60 kg/m   Wt Readings from Last 3 Encounters:  03/01/17 (!) 313 lb 8 oz (142.2 kg)  02/27/17 (!) 313 lb (142 kg)  01/25/17 (!) 313 lb (142 kg)     General: Well appearing, well nourished in no distress.  Normal mood and affect. Skin: No excessive bruising or rash  Last INR: 3.5 Last PT: 41.7    Last CBC:  Lab Results  Component Value Date   WBC 6.2 10/24/2016   HGB 15.7 10/24/2016   HCT 46.7 10/24/2016   MCV 87 10/24/2016   PLT 252 10/24/2016    Results for orders placed or performed in visit on 01/25/17  CoaguChek XS/INR Waived  Result Value Ref Range   INR 3.0 (H) 0.9 - 1.1   Prothrombin Time 36.1 sec       Assessment:     ICD-10-CM   1. Mechanical heart valve present Z95.2 CoaguChek XS/INR  Waived    Plan:   Discussed current plan face-to-face with patient. For coumadin dosing, elected to continue current dose. Will plan to recheck INR in 1 month.

## 2017-03-20 ENCOUNTER — Encounter: Payer: Medicare Other | Admitting: Unknown Physician Specialty

## 2017-04-02 ENCOUNTER — Ambulatory Visit (INDEPENDENT_AMBULATORY_CARE_PROVIDER_SITE_OTHER): Payer: Medicare Other | Admitting: Family Medicine

## 2017-04-02 ENCOUNTER — Encounter: Payer: Self-pay | Admitting: Family Medicine

## 2017-04-02 VITALS — BP 112/76 | HR 64 | Temp 97.2°F | Wt 307.1 lb

## 2017-04-02 DIAGNOSIS — Z952 Presence of prosthetic heart valve: Secondary | ICD-10-CM | POA: Diagnosis not present

## 2017-04-02 LAB — COAGUCHEK XS/INR WAIVED
INR: 2.4 — ABNORMAL HIGH (ref 0.9–1.1)
Prothrombin Time: 28.7 s

## 2017-04-02 NOTE — Progress Notes (Signed)
   BP 112/76 (BP Location: Right Arm, Patient Position: Sitting, Cuff Size: Large)   Pulse 64   Temp (!) 97.2 F (36.2 C)   Wt (!) 307 lb 1 oz (139.3 kg)   SpO2 99%   BMI 49.56 kg/m    Subjective:    Patient ID: Roberto [name], male    DOB: 1955-12-15, 61 y.o.   MRN: [id number]  CC: Coumadin management  HPI: This patient is a 61 y.o. male who presents for coumadin management. The expected duration of coumadin treatment is lifelong The reason for anticoagulation is  mechanical heart valve.  Present Coumadin dose: 4,4,6 repeat Goal: 2.5-3.5  Excessive bruising: no Nose bleeding: no Rectal bleeding: no Prolonged menstrual cycles: N/A Eating diet with consistent amounts of foods containing Vitamin K:yes Any recent antibiotic use? no  Relevant past medical, surgical, family and social history reviewed and updated as indicated. Interim medical history since our last visit reviewed. Allergies and medications reviewed and updated.  ROS: Per HPI unless specifically indicated above     Objective:    BP 112/76 (BP Location: Right Arm, Patient Position: Sitting, Cuff Size: Large)   Pulse 64   Temp (!) 97.2 F (36.2 C)   Wt (!) 307 lb 1 oz (139.3 kg)   SpO2 99%   BMI 49.56 kg/m   Wt Readings from Last 3 Encounters:  04/02/17 (!) 307 lb 1 oz (139.3 kg)  03/01/17 (!) 313 lb 8 oz (142.2 kg)  02/27/17 (!) 313 lb (142 kg)     General: Well appearing, well nourished in no distress.  Normal mood and affect. Skin: No excessive bruising or rash  Last INR: 2.4 Last PT: 28.7   Last CBC:  Lab Results  Component Value Date   WBC 6.2 10/24/2016   HGB 15.7 10/24/2016   HCT 46.7 10/24/2016   MCV 87 10/24/2016   PLT 252 10/24/2016    Results for orders placed or performed in visit on 03/01/17  CoaguChek XS/INR Waived  Result Value Ref Range   INR 3.5 (H) 0.9 - 1.1   Prothrombin Time 41.7 sec       Assessment:     ICD-10-CM   1. Mechanical heart valve present Z95.2  CoaguChek XS/INR Waived    Plan:   Discussed current plan face-to-face with patient. For coumadin dosing, elected to continue current dose. Will plan to recheck INR in 1 month.

## 2017-04-03 ENCOUNTER — Other Ambulatory Visit: Payer: Self-pay | Admitting: Family Medicine

## 2017-04-24 ENCOUNTER — Telehealth: Payer: Self-pay | Admitting: *Deleted

## 2017-04-24 NOTE — Telephone Encounter (Signed)
Left message for patient to notify them that it is time to schedule annual low dose lung cancer screening CT scan. Instructed patient to call back to verify information prior to the scan being scheduled.  

## 2017-05-01 ENCOUNTER — Telehealth: Payer: Self-pay | Admitting: *Deleted

## 2017-05-01 NOTE — Telephone Encounter (Signed)
Left message for patient to notify them that it is time to schedule annual low dose lung cancer screening CT scan. Instructed patient to call back to verify information prior to the scan being scheduled.  

## 2017-05-02 ENCOUNTER — Ambulatory Visit: Payer: Medicare Other | Admitting: Family Medicine

## 2017-05-03 ENCOUNTER — Ambulatory Visit: Payer: Medicare Other | Admitting: Family Medicine

## 2017-05-10 ENCOUNTER — Ambulatory Visit: Payer: Medicare Other | Admitting: Family Medicine

## 2017-05-12 ENCOUNTER — Other Ambulatory Visit: Payer: Self-pay | Admitting: Family Medicine

## 2017-05-17 ENCOUNTER — Ambulatory Visit (INDEPENDENT_AMBULATORY_CARE_PROVIDER_SITE_OTHER): Payer: Medicare Other | Admitting: Family Medicine

## 2017-05-17 ENCOUNTER — Encounter: Payer: Self-pay | Admitting: Family Medicine

## 2017-05-17 VITALS — BP 106/72 | HR 73 | Wt 302.0 lb

## 2017-05-17 DIAGNOSIS — N280 Ischemia and infarction of kidney: Secondary | ICD-10-CM

## 2017-05-17 DIAGNOSIS — I700 Atherosclerosis of aorta: Secondary | ICD-10-CM

## 2017-05-17 DIAGNOSIS — Z952 Presence of prosthetic heart valve: Secondary | ICD-10-CM | POA: Diagnosis not present

## 2017-05-17 DIAGNOSIS — Z23 Encounter for immunization: Secondary | ICD-10-CM | POA: Diagnosis not present

## 2017-05-17 DIAGNOSIS — I10 Essential (primary) hypertension: Secondary | ICD-10-CM | POA: Diagnosis not present

## 2017-05-17 DIAGNOSIS — E785 Hyperlipidemia, unspecified: Secondary | ICD-10-CM

## 2017-05-17 DIAGNOSIS — E119 Type 2 diabetes mellitus without complications: Secondary | ICD-10-CM | POA: Diagnosis not present

## 2017-05-17 LAB — LP+ALT+AST PICCOLO, WAIVED
ALT (SGPT) PICCOLO, WAIVED: 47 U/L (ref 10–47)
AST (SGOT) PICCOLO, WAIVED: 31 U/L (ref 11–38)
CHOLESTEROL PICCOLO, WAIVED: 177 mg/dL (ref <200)
Chol/HDL Ratio Piccolo,Waive: 3.6 mg/dL
HDL Chol Piccolo, Waived: 49 mg/dL — ABNORMAL LOW (ref >59)
LDL Chol Calc Piccolo Waived: 56 mg/dL (ref <100)
TRIGLYCERIDES PICCOLO,WAIVED: 361 mg/dL — AB (ref <150)
VLDL CHOL CALC PICCOLO,WAIVE: 72 mg/dL — AB (ref <30)

## 2017-05-17 LAB — COAGUCHEK XS/INR WAIVED
INR: 2.5 — AB (ref 0.9–1.1)
Prothrombin Time: 30.3 s

## 2017-05-17 LAB — BAYER DCA HB A1C WAIVED: HB A1C (BAYER DCA - WAIVED): 5.4 % (ref <7.0)

## 2017-05-17 NOTE — Assessment & Plan Note (Signed)
Under good control with INR of 2.5. Continue current dose of coumadin and recheck 1 month.

## 2017-05-17 NOTE — Assessment & Plan Note (Signed)
Continue coumadin. Call with any concerns.

## 2017-05-17 NOTE — Patient Instructions (Addendum)

## 2017-05-17 NOTE — Progress Notes (Signed)
BP 106/72 (BP Location: Left Arm, Cuff Size: Normal)   Pulse 73   Wt (!) 302 lb (137 kg)   SpO2 96%   BMI 48.74 kg/m    Subjective:    Patient ID: Roberto [name], male    DOB: [birth year], 61 y.o.   MRN: [id number]  HPI: Roberto [name] is a 61 y.o. male  Chief Complaint  Patient presents with  . Follow-up  . Coagulation Disorder  . Hypertension  . Hyperlipidemia   Coumadin Management.  The expected duration of coumadin treatment is lifelong The reason for anticoagulation is  mechanical heart valve.  Present Coumadin dose: 4,4,6 repeat Goal: 2.5-3.5  Excessive bruising: no Nose bleeding: no Rectal bleeding: no Prolonged menstrual cycles: N/A Eating diet with consistent amounts of foods containing Vitamin K:yes Any recent antibiotic use? no  Last PT: 30.3 Last INR: 2.5  HYPERTENSION / HYPERLIPIDEMIA Satisfied with current treatment? yes Duration of hypertension: chronic BP monitoring frequency: not checking BP medication side effects: no Past BP meds: lisinopril, metoprolol, lasix Duration of hyperlipidemia: chronic Cholesterol medication side effects: no Cholesterol supplements: none Past cholesterol medications: simvastatin (zocor) Medication compliance: excellent compliance Aspirin: yes Recent stressors: no Recurrent headaches: no Visual changes: no Palpitations: no Dyspnea: no Chest pain: no Lower extremity edema: no Dizzy/lightheaded: no  DIABETES Hypoglycemic episodes:no Polydipsia/polyuria: no Visual disturbance: no Chest pain: no Paresthesias: no Glucose Monitoring: no Taking Insulin?: no Blood Pressure Monitoring: not checking Retinal Examination: Up to Date Foot Exam: Up to Date Diabetic Education: Completed Pneumovax: Up to Date Influenza: Up to Date Aspirin: yes  Relevant past medical, surgical, family and social history reviewed and updated as indicated. Interim medical history since our last visit reviewed. Allergies and  medications reviewed and updated.  Review of Systems  Constitutional: Negative.   Respiratory: Negative.   Cardiovascular: Negative.   Gastrointestinal: Negative.   Neurological: Negative.   Psychiatric/Behavioral: Negative.     Per HPI unless specifically indicated above     Objective:    BP 106/72 (BP Location: Left Arm, Cuff Size: Normal)   Pulse 73   Wt (!) 302 lb (137 kg)   SpO2 96%   BMI 48.74 kg/m   Wt Readings from Last 3 Encounters:  05/17/17 (!) 302 lb (137 kg)  04/02/17 (!) 307 lb 1 oz (139.3 kg)  03/01/17 (!) 313 lb 8 oz (142.2 kg)    Physical Exam  Constitutional: He is oriented to person, place, and time. He appears well-developed and well-nourished. No distress.  HENT:  Head: Normocephalic and atraumatic.  Right Ear: Hearing normal.  Left Ear: Hearing normal.  Nose: Nose normal.  Eyes: Conjunctivae and lids are normal. Right eye exhibits no discharge. Left eye exhibits no discharge. No scleral icterus.  Cardiovascular: Normal rate, regular rhythm, normal heart sounds and intact distal pulses. Exam reveals no gallop and no friction rub.  No murmur heard. Pulmonary/Chest: Effort normal and breath sounds normal. No respiratory distress. He has no wheezes. He has no rales. He exhibits no tenderness.  Musculoskeletal: Normal range of motion.  Neurological: He is alert and oriented to person, place, and time.  Skin: Skin is warm and intact. No rash noted. He is not diaphoretic. No erythema. No pallor.  Psychiatric: He has a normal mood and affect. His speech is normal and behavior is normal. Judgment and thought content normal. Cognition and memory are normal.  Nursing note and vitals reviewed.   Results for orders placed or performed in visit on 04/02/17  CoaguChek XS/INR Waived  Result Value Ref Range   INR 2.4 (H) 0.9 - 1.1   Prothrombin Time 28.7 sec      Assessment & Plan:   Problem List Items Addressed This Visit      Cardiovascular and  Mediastinum   Benign essential hypertension - Primary    Under good control. Continue current regimen. Continue to monitor. Call with any concerns.       Relevant Orders   Comprehensive metabolic panel   Aortic atherosclerosis ([place])    Will keep BP and cholesterol under good control. Call with any concerns.       Relevant Orders   CoaguChek XS/INR Waived   Renal infarct ([place])    Continue coumadin. Call with any concerns.       Relevant Orders   Comprehensive metabolic panel     Endocrine   Diabetes mellitus type 2, diet-controlled ([place])    Under good control with A1c of 5.4. Continue current regimen. Continue to monitor. Call with any concerns.       Relevant Orders   Bayer DCA Hb A1c Waived     Other   Mechanical heart valve present    Under good control with INR of 2.5. Continue current dose of coumadin and recheck 1 month.       Hyperlipidemia    Under good control. Continue current regimen. Continue to monitor. Call with any concerns.       Relevant Orders   LP+ALT+AST Piccolo, Waived    Other Visit Diagnoses    Immunization due       Flu shot given today.   Relevant Orders   Flu Vaccine QUAD 6+ mos PF IM (Fluarix Quad PF) (Completed)       Follow up plan: Return in about 4 weeks (around 06/14/2017) for INR.

## 2017-05-17 NOTE — Assessment & Plan Note (Signed)
Under good control. Continue current regimen. Continue to monitor. Call with any concerns. 

## 2017-05-17 NOTE — Assessment & Plan Note (Addendum)
Under good control with A1c of 5.4. Continue current regimen. Continue to monitor. Call with any concerns.

## 2017-05-17 NOTE — Assessment & Plan Note (Signed)
Will keep BP and cholesterol under good control. Call with any concerns.  ?

## 2017-05-18 LAB — COMPREHENSIVE METABOLIC PANEL
A/G RATIO: 1.3 (ref 1.2–2.2)
ALK PHOS: 60 IU/L (ref 39–117)
ALT: 38 IU/L (ref 0–44)
AST: 26 IU/L (ref 0–40)
Albumin: 4.2 g/dL (ref 3.6–4.8)
BUN/Creatinine Ratio: 17 (ref 10–24)
BUN: 16 mg/dL (ref 8–27)
Bilirubin Total: 0.3 mg/dL (ref 0.0–1.2)
CHLORIDE: 106 mmol/L (ref 96–106)
CO2: 26 mmol/L (ref 20–29)
Calcium: 9.7 mg/dL (ref 8.6–10.2)
Creatinine, Ser: 0.93 mg/dL (ref 0.76–1.27)
GFR calc Af Amer: 101 mL/min/{1.73_m2} (ref >59)
GFR calc non Af Amer: 88 mL/min/{1.73_m2} (ref >59)
GLOBULIN, TOTAL: 3.2 g/dL (ref 1.5–4.5)
Glucose: 84 mg/dL (ref 65–99)
POTASSIUM: 5.1 mmol/L (ref 3.5–5.2)
SODIUM: 144 mmol/L (ref 134–144)
Total Protein: 7.4 g/dL (ref 6.0–8.5)

## 2017-05-22 ENCOUNTER — Encounter: Payer: Self-pay | Admitting: *Deleted

## 2017-05-29 ENCOUNTER — Telehealth: Payer: Self-pay | Admitting: Family Medicine

## 2017-05-29 NOTE — Telephone Encounter (Signed)
Patient notified of results.

## 2017-05-29 NOTE — Telephone Encounter (Signed)
Copied from [place] #[id number]. Topic: Quick Communication - See Telephone Encounter >> May 29, 2017 11:46 AM Ether Griffins B wrote: CRM for notification. See Telephone encounter for:  Pt called 11/19 to find out about lab results but no one has contacted him about the results  05/29/17.

## 2017-05-29 NOTE — Telephone Encounter (Signed)
I released his labs to [facility] before he inactivated it.

## 2017-06-14 ENCOUNTER — Ambulatory Visit: Payer: Medicare Other | Admitting: Family Medicine

## 2017-06-14 NOTE — Progress Notes (Deleted)
There were no vitals taken for this visit.   Subjective:    Patient ID: Roberto [name], male    DOB: [birth year], 61 y.o.   MRN: [id number]  CC: Coumadin management  HPI: This patient is a 61 y.o. male who presents for coumadin management. The expected duration of coumadin treatment is {Blank single:19197::"lifelong","3 months","6 months"} The reason for anticoagulation is  {Blank single:19197::"A. Fib","thombophlebitis","mechanical heart valve","non-mechanical heart valve","Factor V Leiden","DVT/PE"}.  Present Coumadin dose: Goal: {Blank single:19197::"2.0-3.0","2.5-3.5"} The patient does not have an active anticoagulation episode. Excessive bruising: {Blank single:19197::"yes","no"} Nose bleeding: {Blank single:19197::"yes","no"} Rectal bleeding: {Blank single:19197::"yes","no"} Prolonged menstrual cycles: {Blank single:19197::"yes","no","N/A"} Eating diet with consistent amounts of foods containing Vitamin K:{Blank single:19197::"yes","no"} Any recent antibiotic use? {Blank single:19197::"yes","no"}  Relevant past medical, surgical, family and social history reviewed and updated as indicated. Interim medical history since our last visit reviewed. Allergies and medications reviewed and updated.  ROS: Per HPI unless specifically indicated above     Objective:    There were no vitals taken for this visit.  Wt Readings from Last 3 Encounters:  05/17/17 (!) 302 lb (137 kg)  04/02/17 (!) 307 lb 1 oz (139.3 kg)  03/01/17 (!) 313 lb 8 oz (142.2 kg)     General: Well appearing, well nourished in no distress.  Normal mood and affect. Skin: No excessive bruising or rash  Last INR:     Last CBC:  Lab Results  Component Value Date   WBC 6.2 10/24/2016   HGB 15.7 10/24/2016   HCT 46.7 10/24/2016   MCV 87 10/24/2016   PLT 252 10/24/2016    Results for orders placed or performed in visit on 05/17/17  CoaguChek XS/INR Waived  Result Value Ref Range   INR 2.5 (H) 0.9 - 1.1   Prothrombin Time 30.3 sec  Bayer DCA Hb A1c Waived  Result Value Ref Range   Bayer DCA Hb A1c Waived 5.4 <7.0 %  LP+ALT+AST Piccolo, Waived  Result Value Ref Range   ALT (SGPT) Piccolo, Waived 47 10 - 47 U/L   AST (SGOT) Piccolo, Waived 31 11 - 38 U/L   Cholesterol Piccolo, Waived 177 <200 mg/dL   HDL Chol Piccolo, Waived 49 (L) >59 mg/dL   Triglycerides Piccolo,Waived 361 (H) <150 mg/dL   Chol/HDL Ratio Piccolo,Waive 3.6 mg/dL   LDL Chol Calc Piccolo Waived 56 <100 mg/dL   VLDL Chol Calc Piccolo,Waive 72 (H) <30 mg/dL  Comprehensive metabolic panel  Result Value Ref Range   Glucose 84 65 - 99 mg/dL   BUN 16 8 - 27 mg/dL   Creatinine, Ser 0.93 0.76 - 1.27 mg/dL   GFR calc non Af Amer 88 >59 mL/min/1.73   GFR calc Af Amer 101 >59 mL/min/1.73   BUN/Creatinine Ratio 17 10 - 24   Sodium 144 134 - 144 mmol/L   Potassium 5.1 3.5 - 5.2 mmol/L   Chloride 106 96 - 106 mmol/L   CO2 26 20 - 29 mmol/L   Calcium 9.7 8.6 - 10.2 mg/dL   Total Protein 7.4 6.0 - 8.5 g/dL   Albumin 4.2 3.6 - 4.8 g/dL   Globulin, Total 3.2 1.5 - 4.5 g/dL   Albumin/Globulin Ratio 1.3 1.2 - 2.2   Bilirubin Total 0.3 0.0 - 1.2 mg/dL   Alkaline Phosphatase 60 39 - 117 IU/L   AST 26 0 - 40 IU/L   ALT 38 0 - 44 IU/L       Assessment:     ICD-10-CM   1. Mechanical heart  valve present Z95.2 CoaguChek XS/INR Waived    Plan:   Discussed current plan face-to-face with patient. For coumadin dosing, elected to {Blank single:19197::"continue current dose","change dose to","hold dose"}. Will plan to recheck INR in {Blank single:19197::"1 month","1 week","2 weeks"}.

## 2017-06-20 ENCOUNTER — Ambulatory Visit (INDEPENDENT_AMBULATORY_CARE_PROVIDER_SITE_OTHER): Payer: Medicare Other | Admitting: Family Medicine

## 2017-06-20 ENCOUNTER — Ambulatory Visit: Payer: Self-pay

## 2017-06-20 ENCOUNTER — Encounter: Payer: Self-pay | Admitting: Family Medicine

## 2017-06-20 VITALS — BP 136/82 | HR 73 | Wt 308.0 lb

## 2017-06-20 DIAGNOSIS — J441 Chronic obstructive pulmonary disease with (acute) exacerbation: Secondary | ICD-10-CM

## 2017-06-20 DIAGNOSIS — Z952 Presence of prosthetic heart valve: Secondary | ICD-10-CM

## 2017-06-20 LAB — COAGUCHEK XS/INR WAIVED
INR: 2.8 — AB (ref 0.9–1.1)
PROTHROMBIN TIME: 33.2 s

## 2017-06-20 MED ORDER — ALBUTEROL SULFATE (2.5 MG/3ML) 0.083% IN NEBU: 2.5000 mg | INHALATION_SOLUTION | Freq: Once | RESPIRATORY_TRACT | Status: DC

## 2017-06-20 MED ORDER — AZITHROMYCIN 250 MG PO TABS: ORAL_TABLET | ORAL | 0 refills | Status: DC

## 2017-06-20 MED ORDER — BENZONATATE 200 MG PO CAPS: 200.0000 mg | ORAL_CAPSULE | Freq: Two times a day (BID) | ORAL | 0 refills | Status: DC | PRN

## 2017-06-20 MED ORDER — HYDROCOD POLST-CPM POLST ER 10-8 MG/5ML PO SUER: 5.0000 mL | Freq: Every evening | ORAL | 0 refills | Status: DC | PRN

## 2017-06-20 MED ORDER — PREDNISONE 10 MG PO TABS: ORAL_TABLET | ORAL | 0 refills | Status: DC

## 2017-06-20 NOTE — Assessment & Plan Note (Signed)
INR 2.8- will continue current regimen and recheck 1 month.

## 2017-06-20 NOTE — Progress Notes (Signed)
BP 136/82 (BP Location: Left Arm, Patient Position: Sitting, Cuff Size: Large)   Pulse 73   Wt (!) 308 lb (139.7 kg)   SpO2 96%   BMI 49.71 kg/m    Subjective:    Patient ID: Roberto [name], male    DOB: Apr 05, 1956, 61 y.o.   MRN: [id number]  HPI: Roberto [name] is a 61 y.o. male  Chief Complaint  Patient presents with  . Cough  . Shortness of Breath  . Anticoagulation   UPPER RESPIRATORY TRACT INFECTION Duration: 3 days Worst symptom: cough, SOB Fever: no Cough: yes Shortness of breath: yes Wheezing: yes Chest pain: yes, with cough Chest tightness: no Chest congestion: yes Nasal congestion: no Runny nose: yes Post nasal drip: yes Sneezing: no Sore throat: no Swollen glands: no Sinus pressure: no Headache: no Face pain: no Toothache: no Ear pain: no  Ear pressure: no  Eyes red/itching:no Eye drainage/crusting: no  Vomiting: no Rash: no Fatigue: yes Sick contacts: yes Strep contacts: no  Context: worse Recurrent sinusitis: no Relief with OTC cold/cough medications: no  Treatments attempted: coracetin   Coumadin Management.  The expected duration of coumadin treatment is lifelong The reason for anticoagulation is  mechanical heart valve.  Present Coumadin dose: 4, 4, 6 repeat Goal: 2.5-3.5  Excessive bruising: no Nose bleeding: no Rectal bleeding: no Prolonged menstrual cycles: N/A Eating diet with consistent amounts of foods containing Vitamin K:yes Any recent antibiotic use? no   Relevant past medical, surgical, family and social history reviewed and updated as indicated. Interim medical history since our last visit reviewed. Allergies and medications reviewed and updated.  Review of Systems  Constitutional: Negative.   HENT: Negative.   Eyes: Negative.   Respiratory: Positive for cough, chest tightness, shortness of breath and wheezing. Negative for apnea, choking and stridor.   Psychiatric/Behavioral: Negative.     Per HPI unless  specifically indicated above     Objective:    BP 136/82 (BP Location: Left Arm, Patient Position: Sitting, Cuff Size: Large)   Pulse 73   Wt (!) 308 lb (139.7 kg)   SpO2 96%   BMI 49.71 kg/m   Wt Readings from Last 3 Encounters:  06/20/17 (!) 308 lb (139.7 kg)  05/17/17 (!) 302 lb (137 kg)  04/02/17 (!) 307 lb 1 oz (139.3 kg)    Physical Exam  Constitutional: He is oriented to person, place, and time. He appears well-developed and well-nourished. No distress.  HENT:  Head: Normocephalic and atraumatic.  Right Ear: Hearing normal.  Left Ear: Hearing normal.  Nose: Nose normal.  Eyes: Conjunctivae and lids are normal. Right eye exhibits no discharge. Left eye exhibits no discharge. No scleral icterus.  Cardiovascular: Normal rate, regular rhythm and intact distal pulses. Exam reveals no gallop and no friction rub.  Murmur heard. + click   Pulmonary/Chest: Effort normal. No respiratory distress. He has wheezes. He has no rales. He exhibits no tenderness.  Musculoskeletal: Normal range of motion.  Neurological: He is alert and oriented to person, place, and time.  Skin: Skin is warm, dry and intact. No rash noted. He is not diaphoretic. No erythema. No pallor.  Psychiatric: He has a normal mood and affect. His speech is normal and behavior is normal. Judgment and thought content normal. Cognition and memory are normal.  Nursing note and vitals reviewed.   Results for orders placed or performed in visit on 05/17/17  CoaguChek XS/INR Waived  Result Value Ref Range   INR 2.5 (H)  0.9 - 1.1   Prothrombin Time 30.3 sec  Bayer DCA Hb A1c Waived  Result Value Ref Range   Bayer DCA Hb A1c Waived 5.4 <7.0 %  LP+ALT+AST Piccolo, Waived  Result Value Ref Range   ALT (SGPT) Piccolo, Waived 47 10 - 47 U/L   AST (SGOT) Piccolo, Waived 31 11 - 38 U/L   Cholesterol Piccolo, Waived 177 <200 mg/dL   HDL Chol Piccolo, Waived 49 (L) >59 mg/dL   Triglycerides Piccolo,Waived 361 (H) <150  mg/dL   Chol/HDL Ratio Piccolo,Waive 3.6 mg/dL   LDL Chol Calc Piccolo Waived 56 <100 mg/dL   VLDL Chol Calc Piccolo,Waive 72 (H) <30 mg/dL  Comprehensive metabolic panel  Result Value Ref Range   Glucose 84 65 - 99 mg/dL   BUN 16 8 - 27 mg/dL   Creatinine, Ser 0.93 0.76 - 1.27 mg/dL   GFR calc non Af Amer 88 >59 mL/min/1.73   GFR calc Af Amer 101 >59 mL/min/1.73   BUN/Creatinine Ratio 17 10 - 24   Sodium 144 134 - 144 mmol/L   Potassium 5.1 3.5 - 5.2 mmol/L   Chloride 106 96 - 106 mmol/L   CO2 26 20 - 29 mmol/L   Calcium 9.7 8.6 - 10.2 mg/dL   Total Protein 7.4 6.0 - 8.5 g/dL   Albumin 4.2 3.6 - 4.8 g/dL   Globulin, Total 3.2 1.5 - 4.5 g/dL   Albumin/Globulin Ratio 1.3 1.2 - 2.2   Bilirubin Total 0.3 0.0 - 1.2 mg/dL   Alkaline Phosphatase 60 39 - 117 IU/L   AST 26 0 - 40 IU/L   ALT 38 0 - 44 IU/L      Assessment & Plan:   Problem List Items Addressed This Visit      Other   Mechanical heart valve present    INR 2.8- will continue current regimen and recheck 1 month.       Relevant Orders   CoaguChek XS/INR Waived    Other Visit Diagnoses    COPD exacerbation ([place])    -  Primary   In exacerbation at this time. Will treat with prednisone, azithromycin, tussionex and tessalon. Recheck 2 weeks   Relevant Medications   albuterol (PROVENTIL) (2.5 MG/3ML) 0.083% nebulizer solution 2.5 mg   predniSONE (DELTASONE) 10 MG tablet   azithromycin (ZITHROMAX) 250 MG tablet   chlorpheniramine-HYDROcodone (TUSSIONEX PENNKINETIC ER) 10-8 MG/5ML SUER   benzonatate (TESSALON) 200 MG capsule       Follow up plan: Return in about 2 weeks (around 07/04/2017), or Lung recheck.

## 2017-06-20 NOTE — Telephone Encounter (Signed)
   Reason for Disposition . [1] Continuous (nonstop) coughing interferes with work or school AND [2] no improvement using cough treatment per protocol  Answer Assessment - Initial Assessment Questions 1. ONSET: "When did the cough begin?"      Started 4 days ago 2. SEVERITY: "How bad is the cough today?"      Severe - up all night 3. RESPIRATORY DISTRESS: "Describe your breathing."      Shortness of breath with cough 4. FEVER: "Do you have a fever?" If so, ask: "What is your temperature, how was it measured, and when did it start?"     Unsure 5. HEMOPTYSIS: "Are you coughing up any blood?" If so ask: "How much?" (flecks, streaks, tablespoons, etc.)     No 6. TREATMENT: "What have you done so far to treat the cough?" (e.g., meds, fluids, humidifier)     Coricdan 7. CARDIAC HISTORY: "Do you have any history of heart disease?" (e.g., heart attack, congestive heart failure)      High blood pressure 8. LUNG HISTORY: "Do you have any history of lung disease?"  (e.g., pulmonary embolus, asthma, emphysema)     No 9. PE RISK FACTORS: "Do you have a history of blood clots?" (or: recent major surgery, recent prolonged travel, bedridden )     No 10. OTHER SYMPTOMS: "Do you have any other symptoms? (e.g., runny nose, wheezing, chest pain)       Wheezing 11. PREGNANCY: "Is there any chance you are pregnant?" "When was your last menstrual period?"       No 12. TRAVEL: "Have you traveled out of the country in the last month?" (e.g., travel history, exposures)       No  Protocols used: COUGH - ACUTE NON-PRODUCTIVE-A-AH  Reports having chills, unable to check temperature.Appointment made for today.

## 2017-06-21 ENCOUNTER — Telehealth: Payer: Self-pay | Admitting: Family Medicine

## 2017-06-21 NOTE — Telephone Encounter (Signed)
Notified patient's girlfriend.

## 2017-06-21 NOTE — Telephone Encounter (Signed)
Verbal from Dr.[name] that he can do nebulizer treatments.

## 2017-06-21 NOTE — Telephone Encounter (Signed)
Spoke with [name], patient is having a hard time breathing, can he do nebulizer treatments?

## 2017-06-21 NOTE — Telephone Encounter (Signed)
If he is not getting better with the medicine and the nebulizers,  I want him to go to the ER for IV steroids

## 2017-06-21 NOTE — Telephone Encounter (Signed)
Copied from [place] [id number]. Topic: General - Other >> Jun 20, 2017 12:07 PM Carolyn Stare wrote:   [facility] a [facility] and he need to speak with someone about  it   Juanda Crumble  the pharmacy [phone]  >> Jun 21, 2017 10:22 AM Patrice Paradise wrote: Roberto [name] called for the patient they have spoken to the pharmarcist and have some concerns about meds interaction. They are requesting a call back from Dr. Wynetta Emery asap @ phone # [phone].   >> Jun 21, 2017 10:37 AM Reel, Rexene Edison, CMA wrote: Returned [name]'s call, no answer unable to leave a message.  Verbal from Dr.[name] that it is ok to take the z-pac with the Coumadin.  OK to tell [name] this if she calls back.  >> Jun 21, 2017  1:24 PM Scherrie Gerlach wrote: Wife calling back to advise if ok for pt to take the zpak while on the warfarin I have updated the chart with the correct phone number.

## 2017-07-06 ENCOUNTER — Ambulatory Visit (INDEPENDENT_AMBULATORY_CARE_PROVIDER_SITE_OTHER): Payer: Medicare Other | Admitting: Family Medicine

## 2017-07-06 ENCOUNTER — Ambulatory Visit (INDEPENDENT_AMBULATORY_CARE_PROVIDER_SITE_OTHER): Payer: Medicare Other

## 2017-07-06 ENCOUNTER — Encounter: Payer: Self-pay | Admitting: Family Medicine

## 2017-07-06 ENCOUNTER — Telehealth: Payer: Self-pay | Admitting: Family Medicine

## 2017-07-06 VITALS — BP 117/83 | HR 75 | Temp 97.8°F | Ht 66.0 in | Wt 308.1 lb

## 2017-07-06 VITALS — BP 117/83 | HR 75 | Temp 97.8°F | Resp 17 | Ht 66.0 in | Wt 308.2 lb

## 2017-07-06 DIAGNOSIS — Z0000 Encounter for general adult medical examination without abnormal findings: Secondary | ICD-10-CM

## 2017-07-06 DIAGNOSIS — J441 Chronic obstructive pulmonary disease with (acute) exacerbation: Secondary | ICD-10-CM

## 2017-07-06 MED ORDER — PREDNISONE 10 MG PO TABS: ORAL_TABLET | ORAL | 0 refills | Status: DC

## 2017-07-06 MED ORDER — DOXYCYCLINE HYCLATE 100 MG PO TABS
100.0000 mg | ORAL_TABLET | Freq: Two times a day (BID) | ORAL | 0 refills | Status: DC
Start: 2017-07-06 — End: 2017-07-31

## 2017-07-06 NOTE — Progress Notes (Signed)
BP 117/83   Pulse 75   Temp 97.8 F (36.6 C)   Ht 5\' 6"  (1.676 m)   Wt (!) 308 lb 2 oz (139.8 kg)   BMI 49.73 kg/m    Subjective:    Patient ID: Roberto [name], male    DOB: [birth year], 62 y.o.   MRN: [id number]  HPI: Roberto [name] is a 62 y.o. male  Chief Complaint  Patient presents with  . COPD   He notes that his breathing is better, but not great. Started to get congested again. No fevers. No chills. + wheezing, + SOB, + cough- having trouble bringing things up. Otherwise feeling well.   Relevant past medical, surgical, family and social history reviewed and updated as indicated. Interim medical history since our last visit reviewed. Allergies and medications reviewed and updated.  Review of Systems  Constitutional: Negative.   HENT: Negative.   Respiratory: Positive for cough, shortness of breath and wheezing. Negative for apnea, choking, chest tightness and stridor.   Cardiovascular: Negative.   Psychiatric/Behavioral: Negative.     Per HPI unless specifically indicated above     Objective:    BP 117/83   Pulse 75   Temp 97.8 F (36.6 C)   Ht 5\' 6"  (1.676 m)   Wt (!) 308 lb 2 oz (139.8 kg)   BMI 49.73 kg/m   Wt Readings from Last 3 Encounters:  07/06/17 (!) 308 lb 2 oz (139.8 kg)  07/06/17 (!) 308 lb 3.2 oz (139.8 kg)  06/20/17 (!) 308 lb (139.7 kg)    Physical Exam  Constitutional: He is oriented to person, place, and time. He appears well-developed and well-nourished. No distress.  HENT:  Head: Normocephalic and atraumatic.  Right Ear: Hearing normal.  Left Ear: Hearing normal.  Nose: Nose normal.  Eyes: Conjunctivae and lids are normal. Right eye exhibits no discharge. Left eye exhibits no discharge. No scleral icterus.  Cardiovascular: Normal rate, regular rhythm, normal heart sounds and intact distal pulses. Exam reveals no gallop and no friction rub.  + click  Pulmonary/Chest: Effort normal. No respiratory distress. He has wheezes in the  right upper field, the right middle field, the right lower field, the left upper field, the left middle field and the left lower field. He has rhonchi in the left upper field, the left middle field and the left lower field. He has no rales. He exhibits no tenderness.  Musculoskeletal: Normal range of motion.  Neurological: He is alert and oriented to person, place, and time.  Skin: Skin is intact. No rash noted. He is not diaphoretic.  Psychiatric: He has a normal mood and affect. His speech is normal and behavior is normal. Judgment and thought content normal. Cognition and memory are normal.    Results for orders placed or performed in visit on 06/20/17  CoaguChek XS/INR Waived  Result Value Ref Range   INR 2.8 (H) 0.9 - 1.1   Prothrombin Time 33.2 sec      Assessment & Plan:   Problem List Items Addressed This Visit    None    Visit Diagnoses    COPD exacerbation ([place])    -  Primary   Will obtain x-ray. Will treat with prednisone and doxycycline. Recheck 2 weeks, Call with any concerns.    Relevant Medications   predniSONE (DELTASONE) 10 MG tablet   Other Relevant Orders   DG Chest 2 View       Follow up plan: Return 2 weeks, for Lung  recheck and INR.

## 2017-07-06 NOTE — Telephone Encounter (Signed)
Pharmacy notified that he should decrease by one tablet each day.

## 2017-07-06 NOTE — Telephone Encounter (Signed)
Patient called in saying he went to the [facility] to pick up his prednisone and was told it ws sent in for 1 every 2 days and this is wrong. They said they tried calling, but was unable to get through.

## 2017-07-06 NOTE — Patient Instructions (Addendum)
[name] , Thank you for taking time to come for your Medicare Wellness Visit. I appreciate your ongoing commitment to your health goals. Please review the following plan we discussed and let me know if I can assist you in the future.   Screening recommendations/referrals: Colonoscopy: due now- declined Recommended yearly ophthalmology/optometry visit for glaucoma screening and checkup Recommended yearly dental visit for hygiene and checkup  Vaccinations: Influenza vaccine: up to date Pneumococcal vaccine: up to date, prevnar 13 due at age 62 Tdap vaccine: up to date Shingles vaccine: due, check with your insurance company for coverage   Advanced directives: Advance directive discussed with you today. I have provided a copy for you to complete at home and have notarized. Once this is complete please bring a copy in to our office so we can scan it into your chart.  Conditions/risks identified: Recommend drinking at least 6-8 glasses of water a day   Next appointment: Follow up in one year for your annual wellness exam.   Preventive Care 40-64 Years, Male Preventive care refers to lifestyle choices and visits with your health care provider that can promote health and wellness. What does preventive care include?  A yearly physical exam. This is also called an annual well check.  Dental exams once or twice a year.  Routine eye exams. Ask your health care provider how often you should have your eyes checked.  Personal lifestyle choices, including:  Daily care of your teeth and gums.  Regular physical activity.  Eating a healthy diet.  Avoiding tobacco and drug use.  Limiting alcohol use.  Practicing safe sex.  Taking low-dose aspirin every day starting at age 22. What happens during an annual well check? The services and screenings done by your health care provider during your annual well check will depend on your age, overall health, lifestyle risk factors, and family  history of disease. Counseling  Your health care provider may ask you questions about your:  Alcohol use.  Tobacco use.  Drug use.  Emotional well-being.  Home and relationship well-being.  Sexual activity.  Eating habits.  Work and work Statistician. Screening  You may have the following tests or measurements:  Height, weight, and BMI.  Blood pressure.  Lipid and cholesterol levels. These may be checked every 5 years, or more frequently if you are over 63 years old.  Skin check.  Lung cancer screening. You may have this screening every year starting at age 69 if you have a 30-pack-year history of smoking and currently smoke or have quit within the past 15 years.  Fecal occult blood test (FOBT) of the stool. You may have this test every year starting at age 70.  Flexible sigmoidoscopy or colonoscopy. You may have a sigmoidoscopy every 5 years or a colonoscopy every 10 years starting at age 45.  Prostate cancer screening. Recommendations will vary depending on your family history and other risks.  Hepatitis C blood test.  Hepatitis B blood test.  Sexually transmitted disease (STD) testing.  Diabetes screening. This is done by checking your blood sugar (glucose) after you have not eaten for a while (fasting). You may have this done every 1-3 years. Discuss your test results, treatment options, and if necessary, the need for more tests with your health care provider. Vaccines  Your health care provider may recommend certain vaccines, such as:  Influenza vaccine. This is recommended every year.  Tetanus, diphtheria, and acellular pertussis (Tdap, Td) vaccine. You may need a Td booster every  10 years.  Zoster vaccine. You may need this after age 39.  Pneumococcal 13-valent conjugate (PCV13) vaccine. You may need this if you have certain conditions and have not been vaccinated.  Pneumococcal polysaccharide (PPSV23) vaccine. You may need one or two doses if you smoke  cigarettes or if you have certain conditions. Talk to your health care provider about which screenings and vaccines you need and how often you need them. This information is not intended to replace advice given to you by your health care provider. Make sure you discuss any questions you have with your health care provider. Document Released: 07/16/2015 Document Revised: 03/08/2016 Document Reviewed: 04/20/2015 Elsevier Interactive Patient Education  2017 [place] Prevention in the Home Falls can cause injuries. They can happen to people of all ages. There are many things you can do to make your home safe and to help prevent falls. What can I do on the outside of my home?  Regularly fix the edges of walkways and driveways and fix any cracks.  Remove anything that might make you trip as you walk through a door, such as a raised step or threshold.  Trim any bushes or trees on the path to your home.  Use bright outdoor lighting.  Clear any walking paths of anything that might make someone trip, such as rocks or tools.  Regularly check to see if handrails are loose or broken. Make sure that both sides of any steps have handrails.  Any raised decks and porches should have guardrails on the edges.  Have any leaves, snow, or ice cleared regularly.  Use sand or salt on walking paths during winter.  Clean up any spills in your garage right away. This includes oil or grease spills. What can I do in the bathroom?  Use night lights.  Install grab bars by the toilet and in the tub and shower. Do not use towel bars as grab bars.  Use non-skid mats or decals in the tub or shower.  If you need to sit down in the shower, use a plastic, non-slip stool.  Keep the floor dry. Clean up any water that spills on the floor as soon as it happens.  Remove soap buildup in the tub or shower regularly.  Attach bath mats securely with double-sided non-slip rug tape.  Do not have throw rugs  and other things on the floor that can make you trip. What can I do in the bedroom?  Use night lights.  Make sure that you have a light by your bed that is easy to reach.  Do not use any sheets or blankets that are too big for your bed. They should not hang down onto the floor.  Have a firm chair that has side arms. You can use this for support while you get dressed.  Do not have throw rugs and other things on the floor that can make you trip. What can I do in the kitchen?  Clean up any spills right away.  Avoid walking on wet floors.  Keep items that you use a lot in easy-to-reach places.  If you need to reach something above you, use a strong step stool that has a grab bar.  Keep electrical cords out of the way.  Do not use floor polish or wax that makes floors slippery. If you must use wax, use non-skid floor wax.  Do not have throw rugs and other things on the floor that can make you trip. What can  I do with my stairs?  Do not leave any items on the stairs.  Make sure that there are handrails on both sides of the stairs and use them. Fix handrails that are broken or loose. Make sure that handrails are as long as the stairways.  Check any carpeting to make sure that it is firmly attached to the stairs. Fix any carpet that is loose or worn.  Avoid having throw rugs at the top or bottom of the stairs. If you do have throw rugs, attach them to the floor with carpet tape.  Make sure that you have a light switch at the top of the stairs and the bottom of the stairs. If you do not have them, ask someone to add them for you. What else can I do to help prevent falls?  Wear shoes that:  Do not have high heels.  Have rubber bottoms.  Are comfortable and fit you well.  Are closed at the toe. Do not wear sandals.  If you use a stepladder:  Make sure that it is fully opened. Do not climb a closed stepladder.  Make sure that both sides of the stepladder are locked into  place.  Ask someone to hold it for you, if possible.  Clearly mark and make sure that you can see:  Any grab bars or handrails.  First and last steps.  Where the edge of each step is.  Use tools that help you move around (mobility aids) if they are needed. These include:  Canes.  Walkers.  Scooters.  Crutches.  Turn on the lights when you go into a dark area. Replace any light bulbs as soon as they burn out.  Set up your furniture so you have a clear path. Avoid moving your furniture around.  If any of your floors are uneven, fix them.  If there are any pets around you, be aware of where they are.  Review your medicines with your doctor. Some medicines can make you feel dizzy. This can increase your chance of falling. Ask your doctor what other things that you can do to help prevent falls. This information is not intended to replace advice given to you by your health care provider. Make sure you discuss any questions you have with your health care provider. Document Released: 04/15/2009 Document Revised: 11/25/2015 Document Reviewed: 07/24/2014 Elsevier Interactive Patient Education  2017 [employer].

## 2017-07-06 NOTE — Telephone Encounter (Signed)
Copied from [place] [phone]. Topic: Quick Communication - See Telephone Encounter >> Jul 06, 2017  2:58 PM Vernona Rieger wrote: CRM for notification. See Telephone encounter for:   07/06/17. Pt went to [facility] to get the prednisone and it was sent for 1 every 2 days. The pharmacy told him it was sent wrong & they have been trying to call and get if fixed but can not get through. Please advise

## 2017-07-06 NOTE — Progress Notes (Signed)
Subjective:   Roberto [name] is a 62 y.o. male who presents for Medicare Annual/Subsequent preventive examination.  Review of Systems:   Cardiac Risk Factors include: hypertension;advanced age (>68men, >45 women);male gender;obesity (BMI >30kg/m2);diabetes mellitus;dyslipidemia;smoking/ tobacco exposure     Objective:    Vitals: BP 117/83 (BP Location: Left Arm, Patient Position: Sitting)   Pulse 75   Temp 97.8 F (36.6 C) (Oral)   Resp 17   Ht 5\' 6"  (1.676 m)   Wt (!) 308 lb 3.2 oz (139.8 kg)   SpO2 98%   BMI 49.74 kg/m   Body mass index is 49.74 kg/m.  Advanced Directives 07/06/2017 05/23/2016 05/18/2016 05/01/2016  Does Patient Have a Medical Advance Directive? No No No No  Does patient want to make changes to medical advance directive? Yes (MAU/Ambulatory/Procedural Areas - Information given) - - -  Would patient like information on creating a medical advance directive? - No - Patient declined - No - patient declined information    Tobacco Social History   Tobacco Use  Smoking Status Former Smoker  . Packs/day: 2.00  . Years: 40.00  . Pack years: 80.00  . Types: Cigarettes  . Last attempt to quit: 12/02/2015  . Years since quitting: 1.5  Smokeless Tobacco Never Used     Counseling given: Not Answered   Clinical Intake:  Pre-visit preparation completed: Yes  Pain : No/denies pain Pain Score: 0-No pain     Nutritional Status: BMI > 30  Obese Nutritional Risks: None Diabetes: Yes CBG done?: No Did pt. bring in CBG monitor from home?: No  How often do you need to have someone help you when you read instructions, pamphlets, or other written materials from your doctor or pharmacy?: 1 - Never What is the last grade level you completed in school?: 2 years college  Interpreter Needed?: No  Information entered by :: Carli Lefevers,LPN   Past Medical History:  Diagnosis Date  . Bicuspid aortic valve   . Cellulitis   . CHF (congestive heart failure) ([place])    . Clotting disorder ([place])   . H/O aortic valve replacement 03/02/2016  . H/O mechanical aortic valve replacement 03/25/2010   #27 Carbomedics mechanical valve  . Hypercholesterolemia   . Renal infarct [facility]) 2017   Multiple right renal infarcts, likely embolic.  . Stroke ([place])   . TIA (transient ischemic attack) 05/2014   Past Surgical History:  Procedure Laterality Date  . AORTIC VALVE REPLACEMENT    . CARDIAC CATHETERIZATION  03/21/2010   No significant CAD. Severe aortic stenosis. Severely elevated left and right heart filling pressures.  Marland Kitchen CARDIAC SURGERY  2009   CHF  . CARPAL TUNNEL RELEASE Left 2005  . TONSILLECTOMY  1962   Family History  Problem Relation Age of Onset  . Arthritis Mother   . Cancer Mother        colon  . Arthritis Father   . Diabetes Father   . Stroke Father   . Cancer Father        colon  . Cancer Brother        brain  . Cancer Sister        breast  . Heart disease Neg Hx    Social History   Socioeconomic History  . Marital status: Single    Spouse name: None  . Number of children: 2  . Years of education: None  . Highest education level: None  Social Needs  . Financial resource strain: Not very hard  .  Food insecurity - worry: Never true  . Food insecurity - inability: Never true  . Transportation needs - medical: No  . Transportation needs - non-medical: No  Occupational History  . Occupation: Disabled    Employer: UNEMPLOYED  Tobacco Use  . Smoking status: Former Smoker    Packs/day: 2.00    Years: 40.00    Pack years: 80.00    Types: Cigarettes    Last attempt to quit: 12/02/2015    Years since quitting: 1.5  . Smokeless tobacco: Never Used  Substance and Sexual Activity  . Alcohol use: No    Alcohol/week: 0.0 oz  . Drug use: No  . Sexual activity: No  Other Topics Concern  . None  Social History Narrative  . None    Outpatient Encounter Medications as of 07/06/2017  Medication Sig  . albuterol (PROVENTIL HFA;VENTOLIN  HFA) 108 (90 Base) MCG/ACT inhaler Inhale 2 puffs into the lungs every 6 (six) hours as needed for wheezing or shortness of breath.  Marland Kitchen aspirin 81 MG chewable tablet Chew 81 mg by mouth daily.    . cyclobenzaprine (FLEXERIL) 10 MG tablet TAKE 1 TABLET BY MOUTH TWICE DAILY AS NEEDED FOR MUSCLE SPASMS.  . Fluticasone-Salmeterol (ADVAIR) 250-50 MCG/DOSE AEPB Inhale 1 puff into the lungs 2 (two) times daily.  . furosemide (LASIX) 40 MG tablet TAKE 1 TABLET BY MOUTH ONCE DAILY  . lisinopril (PRINIVIL,ZESTRIL) 5 MG tablet Take 1 tablet (5 mg total) by mouth daily.  . metoprolol succinate (TOPROL-XL) 100 MG 24 hr tablet Take 1 tablet (100 mg total) by mouth daily. Take with or immediately following a meal.  . simvastatin (ZOCOR) 40 MG tablet TAKE 1 TABLET BY MOUTH AT BEDTIME  . tiotropium (SPIRIVA) 18 MCG inhalation capsule Place 18 mcg into inhaler and inhale daily.  Marland Kitchen warfarin (COUMADIN) 3 MG tablet Take 1 tablet (3 mg total) by mouth daily.  Marland Kitchen warfarin (COUMADIN) 7.5 MG tablet   . warfarin (COUMADIN) 4 MG tablet Take 1 tablet (4 mg total) by mouth daily. (Patient not taking: Reported on 07/06/2017)  . [DISCONTINUED] azithromycin (ZITHROMAX) 250 MG tablet 2 tabs today, then 1 daily for 4 days (Patient not taking: Reported on 07/06/2017)  . [DISCONTINUED] benzonatate (TESSALON) 200 MG capsule Take 1 capsule (200 mg total) by mouth 2 (two) times daily as needed for cough.  . [DISCONTINUED] chlorpheniramine-HYDROcodone (TUSSIONEX PENNKINETIC ER) 10-8 MG/5ML SUER Take 5 mLs by mouth at bedtime as needed.  . [DISCONTINUED] predniSONE (DELTASONE) 10 MG tablet 6 tabs for first 2 days, then 5 tabs for 2 days, decrease by 1 every other day until gone. (Patient not taking: Reported on 07/06/2017)   Facility-Administered Encounter Medications as of 07/06/2017  Medication  . albuterol (PROVENTIL) (2.5 MG/3ML) 0.083% nebulizer solution 2.5 mg    Activities of Daily Living In your present state of health, do you have any  difficulty performing the following activities: 07/06/2017  Hearing? Y  Comment discussed hearing specialist options  Vision? N  Difficulty concentrating or making decisions? N  Walking or climbing stairs? N  Comment SOB occasionally   Dressing or bathing? N  Doing errands, shopping? N  Preparing Food and eating ? N  Using the Toilet? N  In the past six months, have you accidently leaked urine? N  Do you have problems with loss of bowel control? N  Managing your Medications? N  Managing your Finances? N  Housekeeping or managing your Housekeeping? N  Some recent data might be hidden  Patient Care Team: Valerie Roys, DO as PCP - General (Family Medicine)   Assessment:   This is a routine wellness examination for Roberto [name].  Exercise Activities and Dietary recommendations Current Exercise Habits: Home exercise routine, Type of exercise: treadmill, Time (Minutes): > 60, Frequency (Times/Week): 4, Weekly Exercise (Minutes/Week): 0, Intensity: Mild, Exercise limited by: None identified  Goals    . DIET - INCREASE WATER INTAKE     Recommend drinking at least 6-8 glasses of water a day        Fall Risk Fall Risk  07/06/2017 05/17/2017 06/30/2016 05/23/2016  Falls in the past year? No No No No   Is the patient's home free of loose throw rugs in walkways, pet beds, electrical cords, etc?   yes      Grab bars in the bathroom? no      Handrails on the stairs?   yes      Adequate lighting?   yes  Timed Get Up and Go Performed: completed in 8 seconds with no use of assistive devices, steady gait. No intervention needed   Depression Screen PHQ 2/9 Scores 07/06/2017 05/17/2017 01/25/2017 05/23/2016  PHQ - 2 Score 2 0 1 1  PHQ- 9 Score 2 - 2 -    Cognitive Function     6CIT Screen 07/06/2017  What Year? 0 points  What month? 0 points  What time? 0 points  Count back from 20 0 points  Months in reverse 0 points  Repeat phrase 0 points  Total Score 0    Immunization History    Administered Date(s) Administered  . Influenza,inj,Quad PF,6+ Mos 04/19/2016, 05/17/2017  . Pneumococcal Polysaccharide-23 06/09/2014  . Tdap 06/09/2014    Qualifies for Shingles Vaccine? discussed option for shingrix vaccine  Screening Tests Health Maintenance  Topic Date Due  . COLONOSCOPY  08/25/2004  . FOOT EXAM  04/19/2017  . OPHTHALMOLOGY EXAM  06/01/2017  . HIV Screening  05/17/2018 (Originally 08/25/1970)  . HEMOGLOBIN A1C  11/14/2017  . PNEUMOCOCCAL POLYSACCHARIDE VACCINE (2) 06/10/2019  . TETANUS/TDAP  06/09/2024  . INFLUENZA VACCINE  Completed  . Hepatitis C Screening  Completed   Cancer Screenings: Lung: Low Dose CT Chest recommended if Age 38-80 years, 30 pack-year currently smoking OR have quit w/in 15years. Patient does qualify. Has already done within the last year  Colorectal: due now  Additional Screenings:  Hepatitis B/HIV/Syphillis: not indicated  Hepatitis C Screening: completed 02/09/2015    Plan:    I have personally reviewed and addressed the Medicare Annual Wellness questionnaire and have noted the following in the patient's chart:  A. Medical and social history B. Use of alcohol, tobacco or illicit drugs  C. Current medications and supplements D. Functional ability and status E.  Nutritional status F.  Physical activity G. Advance directives H. List of other physicians I.  Hospitalizations, surgeries, and ER visits in previous 12 months J.  [place] such as hearing and vision if needed, cognitive and depression L. Referrals and appointments   In addition, I have reviewed and discussed with patient certain preventive protocols, quality metrics, and best practice recommendations. A written personalized care plan for preventive services as well as general preventive health recommendations were provided to patient.   Signed,  Tyler Aas, LPN Nurse Health Advisor   Nurse Notes: none

## 2017-07-09 ENCOUNTER — Ambulatory Visit
Admission: RE | Admit: 2017-07-09 | Discharge: 2017-07-09 | Disposition: A | Discharge status: Home / Self Care | Payer: Medicare Other | Source: Ambulatory Visit | Attending: Family Medicine | Admitting: Family Medicine

## 2017-07-09 ENCOUNTER — Telehealth: Payer: Self-pay | Admitting: Family Medicine

## 2017-07-09 DIAGNOSIS — I517 Cardiomegaly: Secondary | ICD-10-CM | POA: Diagnosis not present

## 2017-07-09 DIAGNOSIS — Z87891 Personal history of nicotine dependence: Secondary | ICD-10-CM | POA: Diagnosis not present

## 2017-07-09 DIAGNOSIS — J441 Chronic obstructive pulmonary disease with (acute) exacerbation: Secondary | ICD-10-CM | POA: Diagnosis not present

## 2017-07-09 DIAGNOSIS — R05 Cough: Secondary | ICD-10-CM | POA: Diagnosis not present

## 2017-07-09 DIAGNOSIS — R0602 Shortness of breath: Secondary | ICD-10-CM | POA: Diagnosis not present

## 2017-07-09 NOTE — Telephone Encounter (Signed)
Please let him know that there's no sign of pneumonia on his CXR. Thanks!

## 2017-07-09 NOTE — Telephone Encounter (Signed)
Patient notified

## 2017-07-13 ENCOUNTER — Ambulatory Visit: Payer: Medicare Other | Admitting: Family Medicine

## 2017-07-22 IMAGING — CT CT ANGIO CHEST-ABD-PELV FOR DISSECTION W/ AND WO/W CM
2 of 7 series · 14 of 46 positions shown, 16 images · IV contrast (APPLIED)
Comparison: Noncontrast CT Abdomen and Pelvis 0344 hours today.
Noncontrast chest CT 03/14/2016.

CLINICAL DATA: 60-year-old male with right flank pain since 9289
hours today. Initial encounter.

EXAM:
CT ANGIOGRAPHY CHEST, ABDOMEN AND PELVIS
TECHNIQUE: Multidetector CT imaging through the chest, abdomen and pelvis was
performed using the standard protocol during bolus administration of
intravenous contrast. Multiplanar reconstructed images and MIPs were
obtained and reviewed to evaluate the vascular anatomy.
CONTRAST:  100 mL Isovue 370

[Series 6: axial arterial · axial · arterial · 0.83mm/px · z∈[-597,-21]mm · 11 of 214 slices shown, 13 images]
[im 11/214  soft-tissue]
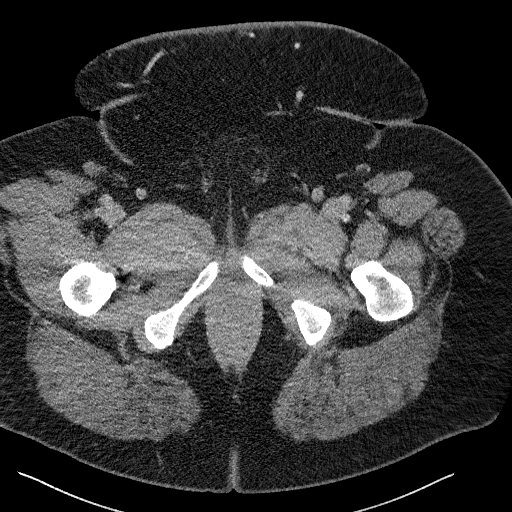
[im 11/214  bone]
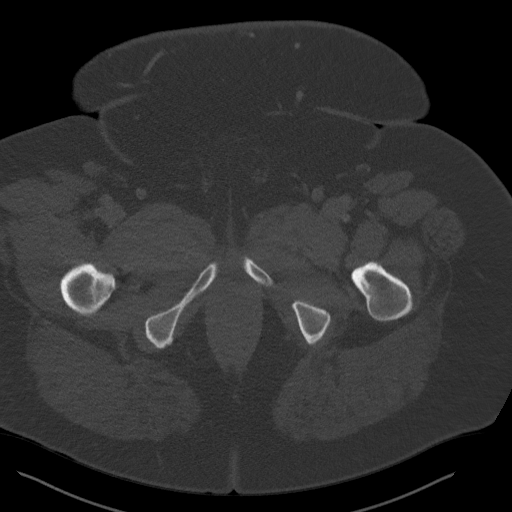
[im 32/214  soft-tissue]
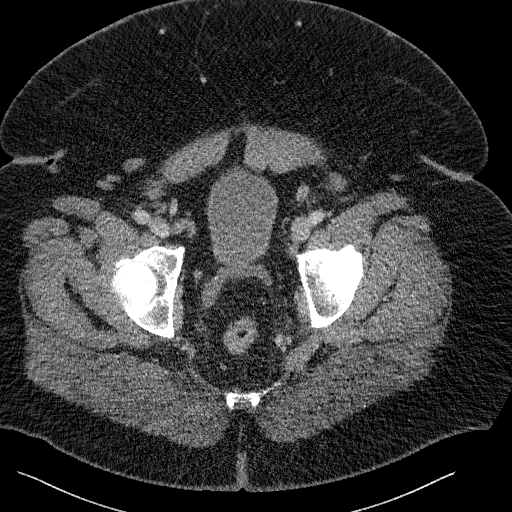
[im 54/214  soft-tissue]
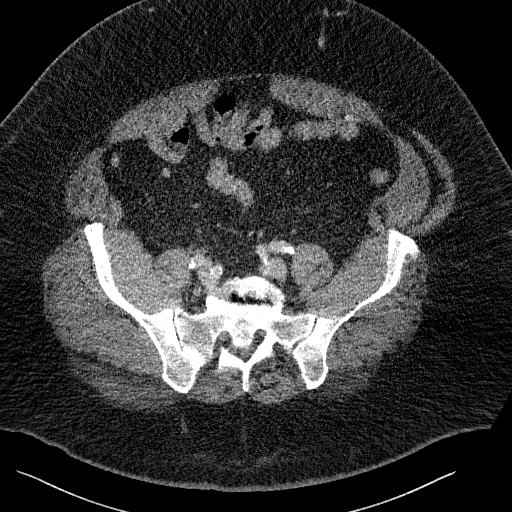
[im 75/214  soft-tissue]
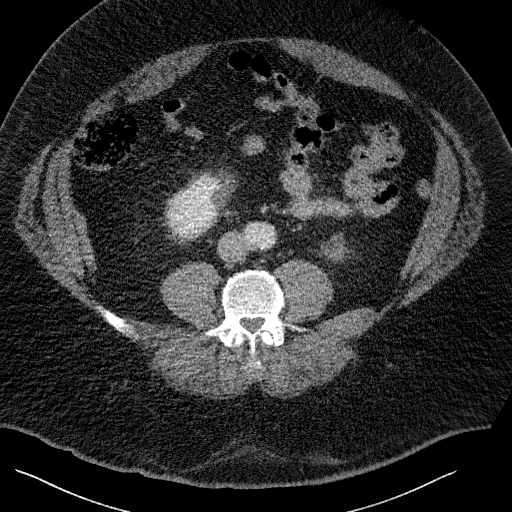
[im 86/214  soft-tissue]
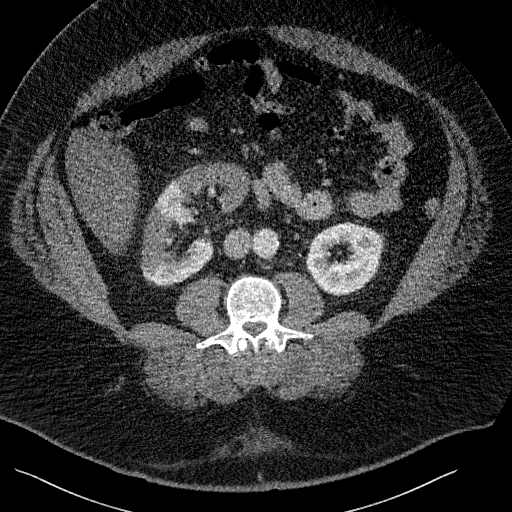
[im 107/214  soft-tissue]
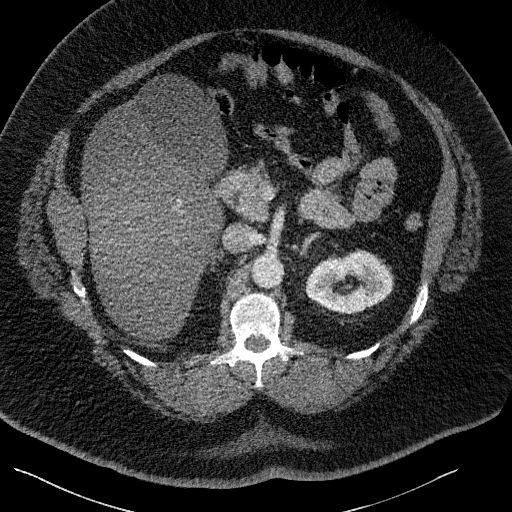
[im 128/214  soft-tissue]
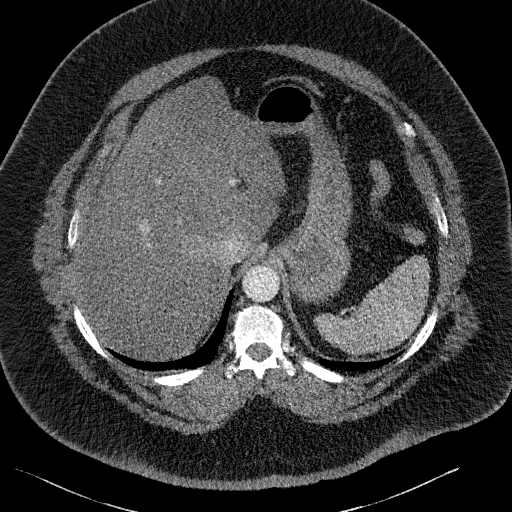
[im 139/214  soft-tissue]
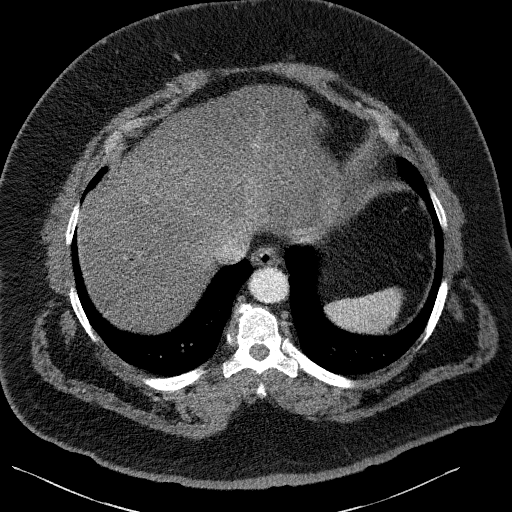
[im 160/214  soft-tissue]
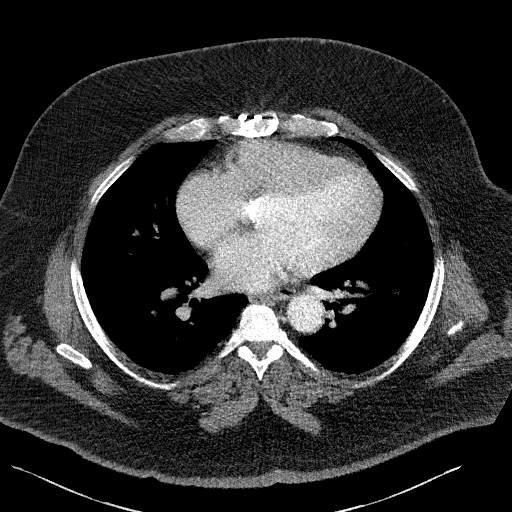
[im 160/214  bone]
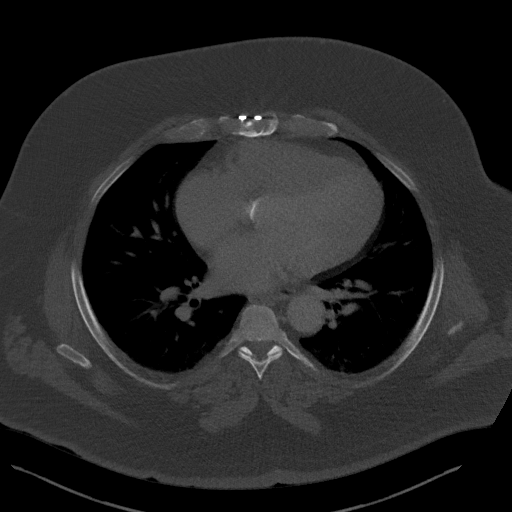
[im 182/214  soft-tissue]
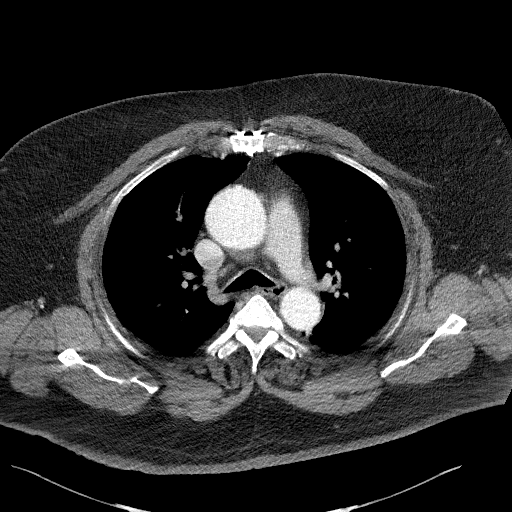
[im 203/214  soft-tissue]
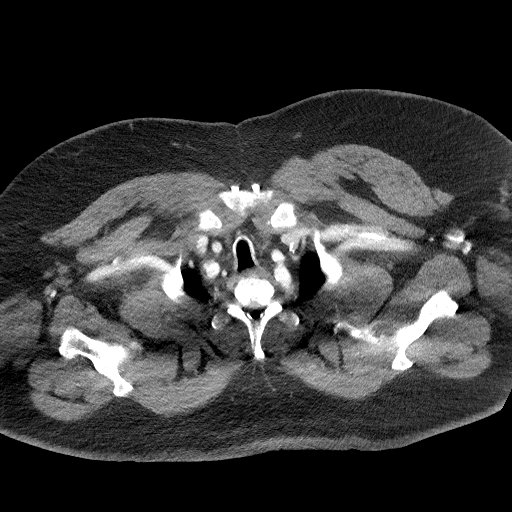

[Series 8: coronals · coronal · 0.96mm/px · 3 of 198 slices shown]
[im 50/198  soft-tissue]
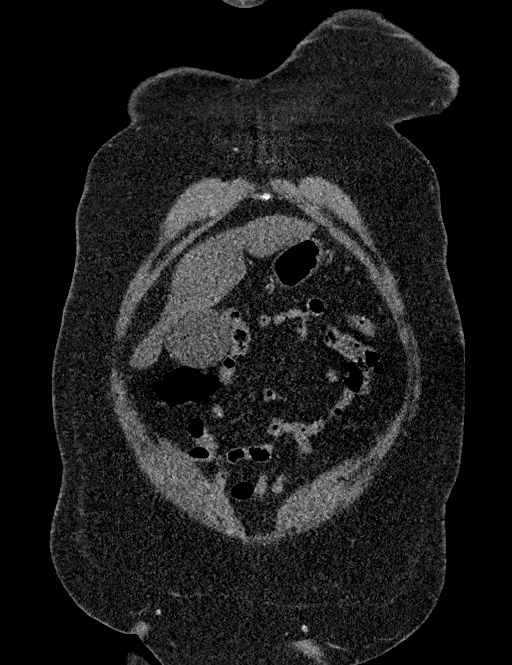
[im 99/198  soft-tissue]
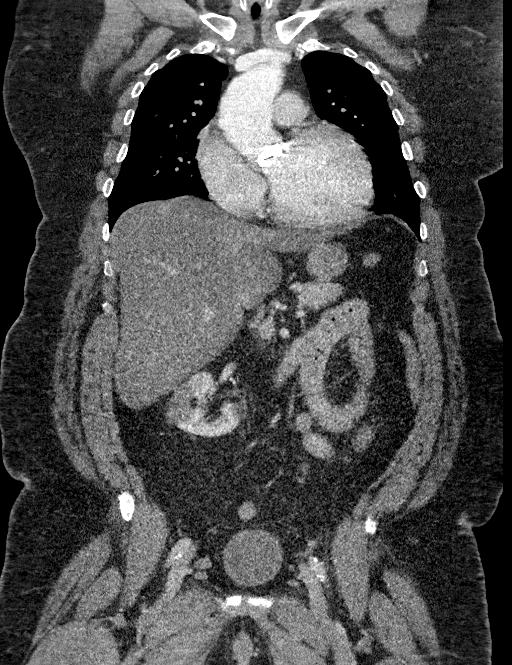
[im 148/198  soft-tissue]
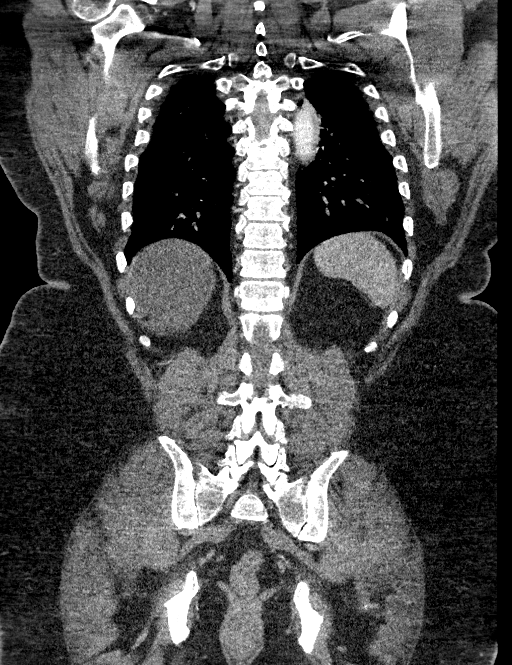

[14 of 46 positions shown; findings below may reference images not displayed]

FINDINGS: CTA CHEST FINDINGS

Cardiovascular: Calcified aortic atherosclerosis. Fusiform
enlargement of the proximal ascending aorta appears stable since
[DATE] mm diameter. No thoracic aortic dissection.
Negative proximal great vessels aside from tortuosity. Mild for age
calcified plaque in the thoracic aorta which is otherwise negative.

Prosthetic aortic valve.  Calcified coronary artery atherosclerosis.

No pericardial effusion.

Mediastinum/Nodes: No mediastinal lymphadenopathy.

Lungs/Pleura: No pleural effusion. Major airways are patent. Lung
parenchyma is stable from the low-dose chest CT on 03/14/2016
(please see that report).

Musculoskeletal: Sequelae of median sternotomy. No acute osseous
abnormality identified.

Review of the MIP images confirms the above findings.

CTA ABDOMEN AND PELVIS FINDINGS

VASCULAR

The abdominal aorta is patent with mild for age calcified plaque.
There is mild calcified plaque at the right renal artery origin
(series 6, image 110). The main right renal artery remains patent.
However, the right kidney is abnormally enhancing in appearance
consistent with multiple renal infarcts (series 6, image 132). There
is minimal to mild Renal hilar vessel level calcified plaque.

No abdominal aortic aneurysm or dissection. Iliac artery calcified
plaque which is more moderate and advanced than the plaque affecting
the aorta. The bilateral iliac and proximal femoral artery is appear
remain patent; contrast timing is suboptimal in the lower pelvis.

Review of the MIP images confirms the above findings.

NON-VASCULAR

Hepatobiliary: Diffuse fatty liver disease. Mildly distended but
otherwise negative gallbladder.

Pancreas: Negative.

Spleen: Negative.

Adrenals/Urinary Tract: Negative adrenal glands. The left kidney is
normal.

The right kidney is abnormal with multiple peripheral and
wedge-shaped areas of hypoenhancement in keeping with right renal
infarcts. The midpole is relatively spared.

No hydronephrosis or hydroureter.  No abdominal free fluid.

Negative urinary bladder.

Stomach/Bowel: Decompressed rectosigmoid colon. Mildly redundant
sigmoid. Decompressed left colon. Much of the transverse colon is
decompressed. The hepatic flexure is redundant. Negative right colon
and appendix. Negative terminal ileum. No dilated small bowel.
Negative stomach and duodenum.

Lymphatic: No lymphadenopathy.

Reproductive: Negative.

Other: No pelvic free fluid

Musculoskeletal: Severe lower lumbar spine facet degeneration with
vacuum facet. Advanced L5-S1 disc and endplate degeneration. No
acute osseous abnormality identified.
IMPRESSION: 1. Positive for multiple right renal infarcts.
There is calcified aortic and renal artery atherosclerosis, but this
seems average or somewhat below average for patient age. This
patient has a prosthetic aortic valve. Perhaps he is inadequately
anticoagulated. This was discussed by telephone with Dr.
JESULA FODOR on 05/01/2016 at [DATE] .
2. Stable fusiform enlargement of the ascending aorta since the
chest CT on 03/14/2016. No aortic dissection.
3. More moderate calcified atherosclerosis of the iliac and proximal
femoral arteries which appear to remain patent (pelvic vascular
contrast timing is suboptimal).
4. Calcified coronary artery atherosclerosis.
5. Fatty liver disease.

## 2017-07-26 ENCOUNTER — Ambulatory Visit: Payer: Medicare Other | Admitting: Family Medicine

## 2017-07-27 ENCOUNTER — Encounter: Payer: Self-pay | Admitting: Emergency Medicine

## 2017-07-27 ENCOUNTER — Emergency Department: Payer: Medicare Other

## 2017-07-27 ENCOUNTER — Other Ambulatory Visit: Payer: Self-pay

## 2017-07-27 ENCOUNTER — Emergency Department
Admission: EM | Admit: 2017-07-27 | Discharge: 2017-07-27 | Disposition: A | Discharge status: Home / Self Care | Payer: Medicare Other | Attending: Emergency Medicine | Admitting: Emergency Medicine

## 2017-07-27 DIAGNOSIS — Z8673 Personal history of transient ischemic attack (TIA), and cerebral infarction without residual deficits: Secondary | ICD-10-CM | POA: Insufficient documentation

## 2017-07-27 DIAGNOSIS — R5383 Other fatigue: Secondary | ICD-10-CM | POA: Insufficient documentation

## 2017-07-27 DIAGNOSIS — E119 Type 2 diabetes mellitus without complications: Secondary | ICD-10-CM | POA: Insufficient documentation

## 2017-07-27 DIAGNOSIS — Z952 Presence of prosthetic heart valve: Secondary | ICD-10-CM | POA: Insufficient documentation

## 2017-07-27 DIAGNOSIS — Z87891 Personal history of nicotine dependence: Secondary | ICD-10-CM | POA: Insufficient documentation

## 2017-07-27 DIAGNOSIS — Z79899 Other long term (current) drug therapy: Secondary | ICD-10-CM | POA: Diagnosis not present

## 2017-07-27 DIAGNOSIS — Z7982 Long term (current) use of aspirin: Secondary | ICD-10-CM | POA: Insufficient documentation

## 2017-07-27 DIAGNOSIS — R059 Cough, unspecified: Secondary | ICD-10-CM

## 2017-07-27 DIAGNOSIS — R531 Weakness: Secondary | ICD-10-CM | POA: Diagnosis present

## 2017-07-27 DIAGNOSIS — R609 Edema, unspecified: Secondary | ICD-10-CM | POA: Insufficient documentation

## 2017-07-27 DIAGNOSIS — R05 Cough: Secondary | ICD-10-CM | POA: Diagnosis not present

## 2017-07-27 DIAGNOSIS — R0602 Shortness of breath: Secondary | ICD-10-CM | POA: Diagnosis not present

## 2017-07-27 DIAGNOSIS — Z7901 Long term (current) use of anticoagulants: Secondary | ICD-10-CM | POA: Insufficient documentation

## 2017-07-27 DIAGNOSIS — I5022 Chronic systolic (congestive) heart failure: Secondary | ICD-10-CM | POA: Diagnosis not present

## 2017-07-27 DIAGNOSIS — J449 Chronic obstructive pulmonary disease, unspecified: Secondary | ICD-10-CM | POA: Insufficient documentation

## 2017-07-27 LAB — PROTIME-INR
INR: 2.95
Prothrombin Time: 30.5 seconds — ABNORMAL HIGH (ref 11.4–15.2)

## 2017-07-27 LAB — BASIC METABOLIC PANEL
Anion gap: 7 (ref 5–15)
BUN: 19 mg/dL (ref 6–20)
CO2: 28 mmol/L (ref 22–32)
Calcium: 9.2 mg/dL (ref 8.9–10.3)
Chloride: 108 mmol/L (ref 101–111)
Creatinine, Ser: 0.94 mg/dL (ref 0.61–1.24)
GFR calc Af Amer: >60 mL/min (ref >60)
GFR calc non Af Amer: >60 mL/min (ref >60)
Glucose, Bld: 87 mg/dL (ref 65–99)
Potassium: 4.3 mmol/L (ref 3.5–5.1)
Sodium: 143 mmol/L (ref 135–145)

## 2017-07-27 LAB — TROPONIN I: TROPONIN I: 0.03 ng/mL — AB (ref <0.03)

## 2017-07-27 LAB — CBC
HCT: 48.9 % (ref 40.0–52.0)
Hemoglobin: 16.1 g/dL (ref 13.0–18.0)
MCH: 28.6 pg (ref 26.0–34.0)
MCHC: 33 g/dL (ref 32.0–36.0)
MCV: 86.6 fL (ref 80.0–100.0)
Platelets: 245 10*3/uL (ref 150–440)
RBC: 5.64 MIL/uL (ref 4.40–5.90)
RDW: 14.4 % (ref 11.5–14.5)
WBC: 8.2 10*3/uL (ref 3.8–10.6)

## 2017-07-27 LAB — URINALYSIS, COMPLETE (UACMP) WITH MICROSCOPIC
Bacteria, UA: NONE SEEN
Bilirubin Urine: NEGATIVE
Glucose, UA: NEGATIVE mg/dL
Hgb urine dipstick: NEGATIVE
Ketones, ur: NEGATIVE mg/dL
Leukocytes, UA: NEGATIVE
Nitrite: NEGATIVE
Protein, ur: NEGATIVE mg/dL
Specific Gravity, Urine: 1.024 (ref 1.005–1.030)
pH: 5 (ref 5.0–8.0)

## 2017-07-27 MED ORDER — FUROSEMIDE 40 MG PO TABS: 40.0000 mg | ORAL_TABLET | Freq: Every day | ORAL | 0 refills | Status: DC

## 2017-07-27 NOTE — ED Provider Notes (Signed)
[facility] Emergency Department Provider Note  Time seen: 9:28 PM  I have reviewed the triage vital signs and the nursing notes.   HISTORY  Chief Complaint Weakness and fluid retention    HPI Roberto [name] is a 62 y.o. male with a past medical history of aortic valve replacement on warfarin, CHF, hyperlipidemia, presents to the emergency department for increased lower extremity edema and cough.  According to the patient over the past 6 weeks he has had a fairly frequent dry cough, denies any sputum production.  States he is undergone 2 rounds of antibiotics without improvement in the cough.  Denies any fever or sputum production at any point during the cough.  He also states over the past 3 or 4 weeks he has had increased lower extremity edema bilaterally.  States he is prescribed a as needed diuretic, but had not been taking it until yesterday.  Wife states also an ongoing issue for them is that he will occasionally fall asleep at inappropriate times.  In discussing this further with the patient and wife the patient is a very heavy snorer, wife states he will wake up gasping for air at times.  Patient denies any chest pain.  Patient states he mostly came because of the increased swelling and feeling generalized fatigue weakness and the cough will not resolved.   Past Medical History:  Diagnosis Date  . Bicuspid aortic valve   . Cellulitis   . CHF (congestive heart failure) ([place])   . Clotting disorder ([place])   . H/O aortic valve replacement 03/02/2016  . H/O mechanical aortic valve replacement 03/25/2010   #27 Carbomedics mechanical valve  . Hypercholesterolemia   . Renal infarct [facility]) 2017   Multiple right renal infarcts, likely embolic.  . Stroke ([place])   . TIA (transient ischemic attack) 05/2014    Patient Active Problem List   Diagnosis Date Noted  . Chronic systolic heart failure ([place]) 09/27/2016  . Renal infarct ([place]) 05/01/2016  . Diabetes mellitus  type 2, diet-controlled ([place]) 04/19/2016  . Chronic bilateral low back pain without sciatica 04/19/2016  . Thoracic ascending aortic aneurysm ([place]) 03/16/2016  . Aortic atherosclerosis ([place]) 03/16/2016  . Personal history of tobacco use, presenting hazards to health 03/14/2016  . NICM (nonischemic cardiomyopathy) ([place]) 03/02/2016  . COPD (chronic obstructive pulmonary disease) ([place]) 09/09/2015  . Mechanical heart valve present 02/09/2015  . Benign essential hypertension 02/09/2015  . Hyperlipidemia 02/09/2015    Past Surgical History:  Procedure Laterality Date  . AORTIC VALVE REPLACEMENT    . CARDIAC CATHETERIZATION  03/21/2010   No significant CAD. Severe aortic stenosis. Severely elevated left and right heart filling pressures.  Marland Kitchen CARDIAC SURGERY  2009   CHF  . CARPAL TUNNEL RELEASE Left 2005  . TONSILLECTOMY  1962    Prior to Admission medications   Medication Sig Start Date End Date Taking? Authorizing Provider  albuterol (PROVENTIL HFA;VENTOLIN HFA) 108 (90 Base) MCG/ACT inhaler Inhale 2 puffs into the lungs every 6 (six) hours as needed for wheezing or shortness of breath. 10/24/16   Park Liter P, DO  aspirin 81 MG chewable tablet Chew 81 mg by mouth daily.      [provider]  cyclobenzaprine (FLEXERIL) 10 MG tablet TAKE 1 TABLET BY MOUTH TWICE DAILY AS NEEDED FOR MUSCLE SPASMS. 05/14/17   [name], [name], DO  doxycycline (VIBRA-TABS) 100 MG tablet Take 1 tablet (100 mg total) by mouth 2 (two) times daily. 07/06/17   Park Liter  P, DO  Fluticasone-Salmeterol (ADVAIR) 250-50 MCG/DOSE AEPB Inhale 1 puff into the lungs 2 (two) times daily.    [provider]  furosemide (LASIX) 40 MG tablet TAKE 1 TABLET BY MOUTH ONCE DAILY 04/03/17   [name], [name], DO  lisinopril (PRINIVIL,ZESTRIL) 5 MG tablet Take 1 tablet (5 mg total) by mouth daily. 10/24/16 07/06/17  Park Liter P, DO  metoprolol succinate (TOPROL-XL) 100 MG 24 hr tablet Take 1 tablet (100 mg total)  by mouth daily. Take with or immediately following a meal. 12/20/16 07/06/17  End, Harrell Gave, MD  predniSONE (DELTASONE) 10 MG tablet 6 tabs today, 5 tabs tomorrow, decrease by 1 every other [name] until gone 07/06/17   [name], [name], DO  simvastatin (ZOCOR) 40 MG tablet TAKE 1 TABLET BY MOUTH AT BEDTIME 04/03/17   [name], [name], DO  tiotropium (SPIRIVA) 18 MCG inhalation capsule Place 18 mcg into inhaler and inhale daily.    [provider]  warfarin (COUMADIN) 3 MG tablet Take 1 tablet (3 mg total) by mouth daily. 11/22/16   Park Liter P, DO  warfarin (COUMADIN) 4 MG tablet Take 1 tablet (4 mg total) by mouth daily. Patient not taking: Reported on 07/06/2017 10/24/16   Park Liter P, DO  warfarin (COUMADIN) 7.5 MG tablet  10/25/16   [provider]    No Known Allergies  Family History  Problem Relation Age of Onset  . Arthritis Mother   . Cancer Mother        colon  . Arthritis Father   . Diabetes Father   . Stroke Father   . Cancer Father        colon  . Cancer Brother        brain  . Cancer Sister        breast  . Heart disease Neg Hx     Social History Social History   Tobacco Use  . Smoking status: Former Smoker    Packs/[name]: 2.00    Years: 40.00    Pack years: 80.00    Types: Cigarettes    Last attempt to quit: 12/02/2015    Years since quitting: 1.6  . Smokeless tobacco: Never Used  Substance Use Topics  . Alcohol use: No    Alcohol/week: 0.0 oz  . Drug use: No    Review of Systems Constitutional: Negative for fever.  Positive generalized fatigue. Eyes: Negative for visual complaints ENT: Negative for recent illness/congestion Cardiovascular: Negative for chest pain. Respiratory: Positive shortness of breath, worse with exertion.  Positive for dry cough times 6 weeks. Gastrointestinal: Negative for abdominal pain, vomiting  Genitourinary: Negative for urinary compaints Musculoskeletal: Lower extremity edema which has progressively  worsened over the past several weeks.  Started taking Lasix yesterday. Skin: Negative for skin complaints  Neurological: Negative for headache All other ROS negative  ____________________________________________   PHYSICAL EXAM:  VITAL SIGNS: ED Triage Vitals  Enc Vitals Group     BP 07/27/17 1735 (!) 142/85     Pulse Rate 07/27/17 1735 73     Resp 07/27/17 1735 16     Temp 07/27/17 1735 98.5 F (36.9 C)     Temp Source 07/27/17 1735 Oral     SpO2 07/27/17 1735 96 %     Weight 07/27/17 1736 (!) 308 lb (139.7 kg)     Height 07/27/17 1736 5\' 6"  (1.676 m)     Head Circumference --      Peak Flow --  Pain Score --      Pain Loc --      Pain Edu? --      Excl. in [place]? --     Constitutional: Alert and oriented. Well appearing and in no distress.  Obese. Eyes: Normal exam ENT   Head: Normocephalic and atraumatic.   Mouth/Throat: Mucous membranes are moist. Cardiovascular: Normal rate, regular rhythm.  Respiratory: Normal respiratory effort without tachypnea nor retractions. Breath sounds are clear  Gastrointestinal: Soft and nontender. No distention. Musculoskeletal: Nontender with normal range of motion in all extremities.  1-2+ lower extremity edema, equal bilaterally, calves are nontender to palpation. Neurologic:  Normal speech and language. No gross focal neurologic deficits  Skin:  Skin is warm, dry and intact.  Psychiatric: Mood and affect are normal.   ____________________________________________    EKG  EKG reviewed and interpreted by myself shows sinus rhythm at 78 bpm with a narrow QRS, normal axis, normal intervals, no concerning ST changes.  Occasional PVC.  ____________________________________________    RADIOLOGY  Chest x-ray shows cardiomegaly, no significant edema.  ____________________________________________   INITIAL IMPRESSION / ASSESSMENT AND PLAN / ED COURSE  Pertinent labs & imaging results that were available during my care of  the patient were reviewed by me and considered in my medical decision making (see chart for details).  Patient presents to the emergency department for 6 weeks of cough, 3-4 weeks of increased lower extremity edema and shortness of breath/generalized fatigue.  Differential would include CHF exacerbation, peripheral edema, ACS, pulmonary edema.  Overall the patient appears well, no distress.  Satting 96% on room air with a normal heart rate.  Patient's initial labs are largely within normal limits/baseline.  I will check a troponin as well as a INR.  Given the patient 6 weeks of cough we will also check a chest x-ray.  Patient's cough could very likely be due to interstitial edema due to increasing peripheral edema from CHF.  Patient did start on Lasix yesterday but the wife does not know how much.  States he is prescribed it as needed but they have not been using it until yesterday.  Patient denies any chest pain.  X-ray shows no acute abnormality.  Patient's lab work is largely within normal limits.  Troponin 0.03, largely normal.  I discussed with the patient trial of Lasix for the next 7 days.  He is agreeable to this plan of care.  We will follow up with his primary care doctor.  I also discussed given the snoring and fatigue during the [name] possibility of obstructive sleep apnea, I believe this to be very likely given his snoring and body habitus.  Patient states he will follow-up with his doctor regarding this for possible sleep study.  I discussed return precautions for any chest pain or trouble breathing, patient agreeable to this plan of care.  ____________________________________________   FINAL CLINICAL IMPRESSION(S) / ED DIAGNOSES  Peripheral edema Cough Fatigue    Harvest Dark, MD 07/27/17 2232

## 2017-07-27 NOTE — ED Triage Notes (Signed)
Pt to ED via POV c/o weakness and fatigue and bilateral LE edema. Pt recently finished 2 rounds of doxycyline for URI. Pt still has cough but is not able to get anything up. Pt states that he is falling asleep at "inappropriate times". Pt states that he has similar symptoms in the past before he had his aortic valve replacement. Pt in NAD at this time, pt was ambulatory to triage and is able to talk in complete sentences.

## 2017-07-30 ENCOUNTER — Telehealth: Payer: Self-pay | Admitting: Family Medicine

## 2017-07-30 NOTE — Telephone Encounter (Signed)
Copied from [place] [id number]. Topic: Quick Communication - See Telephone Encounter >> Jul 30, 2017 10:10 AM Synthia Innocent wrote: CRM for notification. See Telephone encounter for:  Patient seen in ER on 07/27/17, INR was 2.9. Patient is scheduled for ER follow up on 07/31/17 07/30/17.

## 2017-07-31 ENCOUNTER — Encounter: Payer: Self-pay | Admitting: Family Medicine

## 2017-07-31 ENCOUNTER — Ambulatory Visit (INDEPENDENT_AMBULATORY_CARE_PROVIDER_SITE_OTHER): Payer: Medicare Other | Admitting: Family Medicine

## 2017-07-31 VITALS — BP 121/80 | HR 81 | Temp 98.7°F | Wt 303.1 lb

## 2017-07-31 DIAGNOSIS — G4733 Obstructive sleep apnea (adult) (pediatric): Secondary | ICD-10-CM | POA: Diagnosis not present

## 2017-07-31 DIAGNOSIS — I5022 Chronic systolic (congestive) heart failure: Secondary | ICD-10-CM | POA: Diagnosis not present

## 2017-07-31 DIAGNOSIS — Z952 Presence of prosthetic heart valve: Secondary | ICD-10-CM

## 2017-07-31 DIAGNOSIS — J449 Chronic obstructive pulmonary disease, unspecified: Secondary | ICD-10-CM

## 2017-07-31 DIAGNOSIS — I509 Heart failure, unspecified: Secondary | ICD-10-CM | POA: Diagnosis not present

## 2017-07-31 MED ORDER — PREDNISONE 50 MG PO TABS: 50.0000 mg | ORAL_TABLET | Freq: Every day | ORAL | 0 refills | Status: DC

## 2017-07-31 NOTE — Progress Notes (Signed)
BP 121/80 (BP Location: Left Arm, Patient Position: Sitting, Cuff Size: Large)   Pulse 81   Temp 98.7 F (37.1 C)   Wt (!) 303 lb 2 oz (137.5 kg)   SpO2 96%   BMI 48.93 kg/m    Subjective:    Patient ID: Roberto [name], male    DOB: 03/09/1956, 62 y.o.   MRN: [id number]  HPI: Roberto [name] is a 62 y.o. male  Chief Complaint  Patient presents with  . Hospitalization Follow-up   ???SLEEP APNEA- has been falling asleep driving. Not feeling well. Snoring a lot Sleep apnea status: uncontrolled Duration: about a month ago Last sleep study: Never had one Treatments attempted: None Wakes feeling refreshed:  yes Daytime hypersomnolence:  yes Fatigue:  yes Insomnia:  no Good sleep hygiene:  yes Difficulty falling asleep:  no Difficulty staying asleep:  no Snoring bothers bed partner:  yes Observed apnea by bed partner: yes Obesity:  yes Hypertension: yes  Pulmonary hypertension:  yes Coronary artery disease:  yes  Has been taking his lasix every day since Friday. Swelling better. Peeing more. Weight down. Still very fatigued and coughing. No fevers or chills.    Coumadin Management.  The expected duration of coumadin treatment is lifelong The reason for anticoagulation is  mechanical heart valve.  Present Coumadin dose: Goal: 2.5-3.5  Excessive bruising: no Nose bleeding: no Rectal bleeding: no Prolonged menstrual cycles: N/A Eating diet with consistent amounts of foods containing Vitamin K:yes Any recent antibiotic use? no   Relevant past medical, surgical, family and social history reviewed and updated as indicated. Interim medical history since our last visit reviewed. Allergies and medications reviewed and updated.  Review of Systems  Constitutional: Positive for fatigue. Negative for activity change, appetite change, chills, diaphoresis, fever and unexpected weight change.  Respiratory: Positive for cough, shortness of breath and wheezing. Negative for  apnea, choking, chest tightness and stridor.   Cardiovascular: Positive for leg swelling. Negative for chest pain and palpitations.  Neurological: Negative.   Psychiatric/Behavioral: Negative.     Per HPI unless specifically indicated above     Objective:    BP 121/80 (BP Location: Left Arm, Patient Position: Sitting, Cuff Size: Large)   Pulse 81   Temp 98.7 F (37.1 C)   Wt (!) 303 lb 2 oz (137.5 kg)   SpO2 96%   BMI 48.93 kg/m   Wt Readings from Last 3 Encounters:  07/31/17 (!) 303 lb 2 oz (137.5 kg)  07/27/17 (!) 308 lb (139.7 kg)  07/06/17 (!) 308 lb 2 oz (139.8 kg)    Physical Exam  Constitutional: He is oriented to person, place, and time. He appears well-developed and well-nourished. No distress.  HENT:  Head: Normocephalic and atraumatic.  Right Ear: Hearing normal.  Left Ear: Hearing normal.  Nose: Nose normal.  Eyes: Conjunctivae and lids are normal. Right eye exhibits no discharge. Left eye exhibits no discharge. No scleral icterus.  Cardiovascular: Normal rate, regular rhythm and intact distal pulses. Exam reveals no gallop and no friction rub.  No murmur heard. + click  Pulmonary/Chest: Effort normal. No respiratory distress. He has decreased breath sounds in the right upper field, the right middle field, the right lower field, the left upper field, the left middle field and the left lower field. He has no wheezes. He has rhonchi in the left lower field. He has no rales. He exhibits no tenderness.  Musculoskeletal: Normal range of motion.  Neurological: He is alert and oriented  to person, place, and time.  Skin: Skin is warm, dry and intact. No rash noted. He is not diaphoretic. No erythema. No pallor.  Psychiatric: He has a normal mood and affect. His speech is normal and behavior is normal. Judgment and thought content normal. Cognition and memory are normal.  Nursing note and vitals reviewed.   Results for orders placed or performed during the hospital  encounter of 26/37/85  Basic metabolic panel  Result Value Ref Range   Sodium 143 135 - 145 mmol/L   Potassium 4.3 3.5 - 5.1 mmol/L   Chloride 108 101 - 111 mmol/L   CO2 28 22 - 32 mmol/L   Glucose, Bld 87 65 - 99 mg/dL   BUN 19 6 - 20 mg/dL   Creatinine, Ser 0.94 0.61 - 1.24 mg/dL   Calcium 9.2 8.9 - 10.3 mg/dL   GFR calc non Af Amer >60 >60 mL/min   GFR calc Af Amer >60 >60 mL/min   Anion gap 7 5 - 15  CBC  Result Value Ref Range   WBC 8.2 3.8 - 10.6 K/uL   RBC 5.64 4.40 - 5.90 MIL/uL   Hemoglobin 16.1 13.0 - 18.0 g/dL   HCT 48.9 40.0 - 52.0 %   MCV 86.6 80.0 - 100.0 fL   MCH 28.6 26.0 - 34.0 pg   MCHC 33.0 32.0 - 36.0 g/dL   RDW 14.4 11.5 - 14.5 %   Platelets 245 150 - 440 K/uL  Urinalysis, Complete w Microscopic  Result Value Ref Range   Color, Urine YELLOW (A) YELLOW   APPearance CLEAR (A) CLEAR   Specific Gravity, Urine 1.024 1.005 - 1.030   pH 5.0 5.0 - 8.0   Glucose, UA NEGATIVE NEGATIVE mg/dL   Hgb urine dipstick NEGATIVE NEGATIVE   Bilirubin Urine NEGATIVE NEGATIVE   Ketones, ur NEGATIVE NEGATIVE mg/dL   Protein, ur NEGATIVE NEGATIVE mg/dL   Nitrite NEGATIVE NEGATIVE   Leukocytes, UA NEGATIVE NEGATIVE   RBC / HPF 0-5 0 - 5 RBC/hpf   WBC, UA 0-5 0 - 5 WBC/hpf   Bacteria, UA NONE SEEN NONE SEEN   Squamous Epithelial / LPF 0-5 (A) NONE SEEN   Mucus PRESENT   Troponin I  Result Value Ref Range   Troponin I 0.03 (HH) <0.03 ng/mL  Protime-INR  Result Value Ref Range   Prothrombin Time 30.5 (H) 11.4 - 15.2 seconds   INR 2.95       Assessment & Plan:   Problem List Items Addressed This Visit      Cardiovascular and Mediastinum   Chronic systolic heart failure (HCC)    No edema. To see cardiology on Monday. Continue lasix daily- checking BMP today. Call with any concerns.      Relevant Orders   Basic metabolic panel     Respiratory   COPD (chronic obstructive pulmonary disease) (HCC)    Ronchi LLL- will treat with burst of prednisone. Call if not  getting better or getting worse.       Relevant Medications   predniSONE (DELTASONE) 50 MG tablet     Other   Mechanical heart valve present    INR 2.9. Continue current regimen. Continue to monitor. Recheck 1 month.        Other Visit Diagnoses    OSA (obstructive sleep apnea)    -  Primary   Strongly suspect. Will obtain sleep study to confirm.    Relevant Orders   Ambulatory referral to Sleep Studies  Follow up plan: Return in about 4 weeks (around 08/28/2017) for INR.

## 2017-07-31 NOTE — Assessment & Plan Note (Signed)
INR 2.9. Continue current regimen. Continue to monitor. Recheck 1 month.

## 2017-07-31 NOTE — Assessment & Plan Note (Signed)
No edema. To see cardiology on Monday. Continue lasix daily- checking BMP today. Call with any concerns.

## 2017-07-31 NOTE — Assessment & Plan Note (Signed)
Ronchi LLL- will treat with burst of prednisone. Call if not getting better or getting worse.

## 2017-08-01 ENCOUNTER — Telehealth: Payer: Self-pay | Admitting: Family Medicine

## 2017-08-01 ENCOUNTER — Ambulatory Visit: Payer: Medicare Other | Admitting: Family Medicine

## 2017-08-01 ENCOUNTER — Encounter: Payer: Self-pay | Admitting: Family Medicine

## 2017-08-01 LAB — BASIC METABOLIC PANEL
BUN / CREAT RATIO: 26 — AB (ref 10–24)
BUN: 20 mg/dL (ref 8–27)
CHLORIDE: 101 mmol/L (ref 96–106)
CO2: 24 mmol/L (ref 20–29)
Calcium: 9.8 mg/dL (ref 8.6–10.2)
Creatinine, Ser: 0.78 mg/dL (ref 0.76–1.27)
GFR calc Af Amer: 112 mL/min/{1.73_m2} (ref >59)
GFR calc non Af Amer: 97 mL/min/{1.73_m2} (ref >59)
GLUCOSE: 83 mg/dL (ref 65–99)
Potassium: 4.2 mmol/L (ref 3.5–5.2)
SODIUM: 142 mmol/L (ref 134–144)

## 2017-08-01 NOTE — Telephone Encounter (Signed)
Patient needs a letter stating that he can return back to work full time beginning on Wednesday 2/5 and also the letter needs to state he needs to be out until then.  Please advise.  Thank you  [phone]

## 2017-08-01 NOTE — Telephone Encounter (Signed)
Routing to provider  

## 2017-08-02 ENCOUNTER — Encounter: Payer: Self-pay | Admitting: Family Medicine

## 2017-08-02 NOTE — Telephone Encounter (Signed)
Letter written and OK to pick up.

## 2017-08-02 NOTE — Telephone Encounter (Signed)
Patient notified.  Will pick up.

## 2017-08-06 ENCOUNTER — Other Ambulatory Visit
Admission: RE | Admit: 2017-08-06 | Discharge: 2017-08-06 | Disposition: A | Discharge status: Home / Self Care | Payer: Medicare Other | Source: Ambulatory Visit | Attending: Physician Assistant | Admitting: Physician Assistant

## 2017-08-06 ENCOUNTER — Ambulatory Visit (INDEPENDENT_AMBULATORY_CARE_PROVIDER_SITE_OTHER): Payer: Medicare Other | Admitting: Physician Assistant

## 2017-08-06 ENCOUNTER — Encounter: Payer: Self-pay | Admitting: Physician Assistant

## 2017-08-06 VITALS — BP 100/60 | HR 77 | Ht 66.0 in | Wt 307.5 lb

## 2017-08-06 DIAGNOSIS — I428 Other cardiomyopathies: Secondary | ICD-10-CM

## 2017-08-06 DIAGNOSIS — I5022 Chronic systolic (congestive) heart failure: Secondary | ICD-10-CM

## 2017-08-06 DIAGNOSIS — I350 Nonrheumatic aortic (valve) stenosis: Secondary | ICD-10-CM | POA: Diagnosis not present

## 2017-08-06 DIAGNOSIS — E785 Hyperlipidemia, unspecified: Secondary | ICD-10-CM

## 2017-08-06 DIAGNOSIS — I10 Essential (primary) hypertension: Secondary | ICD-10-CM | POA: Insufficient documentation

## 2017-08-06 LAB — BASIC METABOLIC PANEL
Anion gap: 10 (ref 5–15)
BUN: 18 mg/dL (ref 6–20)
CHLORIDE: 100 mmol/L — AB (ref 101–111)
CO2: 27 mmol/L (ref 22–32)
CREATININE: 0.98 mg/dL (ref 0.61–1.24)
Calcium: 9.2 mg/dL (ref 8.9–10.3)
GFR calc Af Amer: >60 mL/min (ref >60)
GFR calc non Af Amer: >60 mL/min (ref >60)
Glucose, Bld: 90 mg/dL (ref 65–99)
Potassium: 4.1 mmol/L (ref 3.5–5.1)
Sodium: 137 mmol/L (ref 135–145)

## 2017-08-06 NOTE — Patient Instructions (Signed)
Medication Instructions:  No changes  Labwork: BMET today  BMET in 2 weeks  Testing/Procedures: None needed  Follow-Up: Your physician recommends that you schedule a follow-up appointment in: 3 months with Dr. Saunders Revel.  It was a pleasure seeing you today here in the office. Please do not hesitate to give [place] a call back if you have any further questions. Peter, BSN     Any Other Special Instructions Will Be Listed Below (If Applicable).     If you need a refill on your cardiac medications before your next appointment, please call your pharmacy.

## 2017-08-06 NOTE — Progress Notes (Signed)
Cardiology Office Note Date:  08/06/2017  Patient ID:  Roberto [name], DOB 1956-04-29, MRN [id number] PCP:  Valerie Roys, DO  Cardiologist:  Dr. Saunders Revel, MD    Chief Complaint: ED follow up  History of Present Illness: Roberto [name] is a 62 y.o. male with history of bicuspid aortic valve status post mechanical AVR 03/25/2010 on Coumadin, chronic systolic CHF with EF 94-49%, HLD, stroke, renal infarct who presents for ED follow up of lower extremity swelling.   [facility] 03/2010 showed no significant CAD, LVEDP 31 mmHg, mean AoV gradient 34 mmHg at rest and 47 mmHg with dobutamine 20 mcg/kg/min, AVA 1.0 cm^2, RA 31, RV 68/25, PA 68/47, PCWP 38. PA sat 65%. CO 6.2 L/min (Fick) and 5.3 L/min (thermodilution). TTE 06/2016 showed an EF 40-45%, moderately dilated LV with moderate LVH, AVR well-seated with 14 mmHg gradient, peak AV velocity 2.5 m/s, mild mitral valve thickening with mild MR, mildly dilated RV with mildly reduced contraction.   Patient was most recently seen by Dr. Saunders Revel on 12/20/2016 at which time he was doing relatively well.  He was exercising regularly and had lost approximately 16 pounds.  He was able to mow his lawn without needing to stop or rest.  His Lasix was continued at 40 mg daily.  Metoprolol was titrated to 100 mg daily.  He was most recently seen in the ED on 07/27/2017 with complaints of 6-week history of frequent, dry cough.  He had undergone 2 rounds of antibiotic and steroid therapy without improvement in cough.  There was some associated lower extremity edema bilaterally for the preceding 3-4 weeks.  It was noted in the ED he was taking Lasix as needed rather than daily and had not been taking it for several days leading up to his ED visit.  It is unclear why the patient was not taking Lasix daily as upon review of all of his after visit summaries dating back to when we last saw him in 12/2016 he was continued on Lasix 40 mg daily.  Vitals showed a mildly elevated blood  pressure of 142/85 and a weight of 308 pounds which was down 7 pounds from his last office visit with [place] in 12/2016.  Labs showed an elevated troponin of 0.03 that was not cycled, unremarkable CBC, serum creatinine 0.94, potassium 4.3, INR 2.95.  Chest x-ray showed cardiomegaly and was without edema or focal pulmonary infiltrate.  EKG showed NSR, 78 bpm, frequent multifocal PVCs, nonspecific ST-T changes.  Oxygen saturations 96% on room air.  He was advised to take Lasix daily for 7 days and follow-up as an outpatient.  There was also some concern for possible sleep apnea given patient's snoring and he was advised to follow-up with this as an outpatient.  Patient follow-up with PCP on 1/29 with weight continuing to downtrend to 303 pounds.  He was continued on Lasix 40 mg daily.  He was also prescribed prednisone.  He was referred for outpatient sleep study.  Follow-up be met on 1/29 showed stable serum creatinine of 0.78 with potassium at goal at 4.2.  Patient reports feeling significantly better since resuming Lasix 40 mg daily.  Weight down 8 pounds from 315-307 by our scale.  Also notes less somnolence.  He has a referral for outpatient sleep study and is awaiting call to get this scheduled.  He notes stable 2 pillow orthopnea.  No early satiety.  No abdominal distention.  Chronic lower extremity swelling is at its baseline.  No chest pain.  No PND.  No diaphoresis, nausea, vomiting, dizziness, presyncope, or syncope.  INR is managed through his PCP.   Past Medical History:  Diagnosis Date  . Bicuspid aortic valve    a. s/p #27 Carbomedics mechanical valve on 03/25/2010; b. on Coumadin; c. TTE 12/17: EF 40-45%, moderately dilated LV with moderate LVH, AVR well-seated with 14 mmHg gradient, peak AV velocity 2.5 m/s, mild mitral valve thickening with mild MR, mildly dilated RV with mildly reduced contraction  . Cellulitis   . Chronic systolic CHF (congestive heart failure) ([place])    a. [facility] 03/2010 showed  no significant CAD, LVEDP 31 mmHg, mean AoV gradient 34 mmHg at rest and 47 mmHg with dobutamine 20 mcg/kg/min, AVA 1.0 cm^2, RA 31, RV 68/25, PA 68/47, PCWP 38. PA sat 65%. CO 6.2 L/min (Fick) and 5.3 L/min (thermodilution)  . Clotting disorder ([place])   . H/O mechanical aortic valve replacement 03/25/2010   a. #27 Carbomedics mechanical valve  . Hypercholesterolemia   . Renal infarct [facility]) 2017   Multiple right renal infarcts, likely embolic.  . Stroke ([place])   . TIA (transient ischemic attack) 05/2014    Past Surgical History:  Procedure Laterality Date  . AORTIC VALVE REPLACEMENT    . CARDIAC CATHETERIZATION  03/21/2010   No significant CAD. Severe aortic stenosis. Severely elevated left and right heart filling pressures.  Marland Kitchen CARDIAC SURGERY  2009   CHF  . CARPAL TUNNEL RELEASE Left 2005  . TONSILLECTOMY  1962    Current Meds  Medication Sig  . albuterol (PROVENTIL HFA;VENTOLIN HFA) 108 (90 Base) MCG/ACT inhaler Inhale 2 puffs into the lungs every 6 (six) hours as needed for wheezing or shortness of breath.  Marland Kitchen aspirin 81 MG chewable tablet Chew 81 mg by mouth daily.    . cyclobenzaprine (FLEXERIL) 10 MG tablet Take 10 mg by mouth 2 (two) times daily.  . Fluticasone-Salmeterol (ADVAIR) 250-50 MCG/DOSE AEPB Inhale 1 puff into the lungs 2 (two) times daily as needed.  . furosemide (LASIX) 40 MG tablet Take 1 tablet (40 mg) by mouth once daily as needed  . lisinopril (PRINIVIL,ZESTRIL) 5 MG tablet Take 1 tablet (5 mg total) by mouth daily.  . metoprolol succinate (TOPROL-XL) 100 MG 24 hr tablet Take 1 tablet (100 mg total) by mouth daily. Take with or immediately following a meal.  . simvastatin (ZOCOR) 40 MG tablet TAKE 1 TABLET BY MOUTH AT BEDTIME  . tiotropium (SPIRIVA) 18 MCG inhalation capsule Place 18 mcg into inhaler and inhale daily as needed.  . warfarin (COUMADIN) 4 MG tablet Take as directed  . warfarin (COUMADIN) 7.5 MG tablet Take as directed  . [DISCONTINUED] albuterol  (PROVENTIL HFA;VENTOLIN HFA) 108 (90 Base) MCG/ACT inhaler Inhale 2 puffs into the lungs every 6 (six) hours as needed for wheezing or shortness of breath.  . [DISCONTINUED] cyclobenzaprine (FLEXERIL) 10 MG tablet TAKE 1 TABLET BY MOUTH TWICE DAILY AS NEEDED FOR MUSCLE SPASMS.    Allergies:   Patient has no known allergies.   Social History:  The patient  reports that he has been smoking cigarettes.  He started smoking about 3 weeks ago. He has a 80.00 pack-year smoking history. he has never used smokeless tobacco. He reports that he does not drink alcohol or use drugs.   Family History:  The patient's family history includes Arthritis in his father and mother; Cancer in his brother, father, mother, and sister; Diabetes in his father; Stroke in his father.  ROS:   Review  of Systems  Constitutional: Positive for malaise/fatigue. Negative for chills, diaphoresis, fever and weight loss.       Improved  HENT: Negative for congestion.   Eyes: Negative for discharge and redness.  Respiratory: Negative for cough, hemoptysis, sputum production, shortness of breath and wheezing.   Cardiovascular: Positive for leg swelling. Negative for chest pain, palpitations, orthopnea, claudication and PND.       Improved  Gastrointestinal: Negative for abdominal pain, blood in stool, heartburn, melena, nausea and vomiting.  Genitourinary: Negative for hematuria.  Musculoskeletal: Negative for falls and myalgias.  Skin: Negative for rash.  Neurological: Negative for dizziness, tingling, tremors, sensory change, speech change, focal weakness, loss of consciousness and weakness.  Endo/Heme/Allergies: Does not bruise/bleed easily.  Psychiatric/Behavioral: Negative for substance abuse. The patient is not nervous/anxious.   All other systems reviewed and are negative.    PHYSICAL EXAM:  VS:  BP 100/60 (BP Location: Left Arm, Patient Position: Sitting, Cuff Size: Large)   Pulse 77   Ht '5\' 6"'$  (1.676 m)   Wt (!)  307 lb 8 oz (139.5 kg)   BMI 49.63 kg/m  BMI: Body mass index is 49.63 kg/m.  Physical Exam  Constitutional: He is oriented to person, place, and time. He appears well-developed and well-nourished.  HENT:  Head: Normocephalic and atraumatic.  Eyes: Right eye exhibits no discharge. Left eye exhibits no discharge.  Neck: Normal range of motion. No JVD present.  Cardiovascular: Normal rate, regular rhythm, S1 normal, S2 normal and normal heart sounds. Exam reveals no distant heart sounds, no friction rub, no midsystolic click and no opening snap.  No murmur heard. Pulses:      Posterior tibial pulses are 1+ on the right side, and 1+ on the left side.  Mechanical click noted best in the right upper sternal border  Pulmonary/Chest: Effort normal and breath sounds normal. No respiratory distress. He has no decreased breath sounds. He has no wheezes. He has no rales. He exhibits no tenderness.  Abdominal: Soft. He exhibits no distension. There is no tenderness.  Musculoskeletal: He exhibits edema.  Trace to 1+ bilateral pretibial edema  Neurological: He is alert and oriented to person, place, and time.  Skin: Skin is warm and dry. No cyanosis. Nails show no clubbing.  Psychiatric: He has a normal mood and affect. His speech is normal and behavior is normal. Judgment and thought content normal.      EKG:  Was ordered and interpreted by me today. Shows NSR, 77 bpm, occasional PVCs, poor R wave progression, nonspecific st/t changes  Recent Labs: 05/17/2017: ALT 38 07/27/2017: Hemoglobin 16.1; Platelets 245 07/31/2017: BUN 20; Creatinine, Ser 0.78; Potassium 4.2; Sodium 142  10/24/2016: HDL 38; LDL Calculated 70 05/17/2017: Cholesterol Piccolo, Waived 177; Triglycerides Piccolo,Waived 361; VLDL Chol Calc Piccolo,Waive 72   Estimated Creatinine Clearance: 127.4 mL/min (by C-G formula based on SCr of 0.78 mg/dL).   Wt Readings from Last 3 Encounters:  08/06/17 (!) 307 lb 8 oz (139.5 kg)    07/31/17 (!) 303 lb 2 oz (137.5 kg)  07/27/17 (!) 308 lb (139.7 kg)     Other studies reviewed: Additional studies/records reviewed today include: summarized above  ASSESSMENT AND PLAN:  1. Chronic systolic CHF secondary to an ICM: Much improved following resumption of his Lasix 40 mg daily.  Suspect he may need some component of daily loop diuretic.  Would also recommend if he does transition back to a as needed dosing with his loop diuretic should he redevelop  bronchitis/COPD exacerbation that requires steroid usage he may benefit from resumption of diuretic at that time as well in an effort to avoid fluid retention.  For now, we will continue Lasix 40 mg daily and check a BMP today.  If this is stable, we will continue him on Lasix 40 mg daily with follow-up BMP in 2 weeks time to assess for further stability.  If shortness of breath returns would recommend echocardiogram.  Continue Toprol-XL 100 mg daily along with lisinopril 5 mg daily.  Could consider spironolactone in follow-up.  CHF education.  Daily weights.  2. Aortic stenosis status post mechanical AVR: No symptoms to suggest valve dysfunction.  Mechanical click noted on exam today.  Continue with low-dose aspirin and Coumadin.  His warfarin is managed by his PCP.  Most recent INR 2.9.  3. Suspected sleep apnea/morbid obesity: Weight loss advised.  Agree with referral for outpatient sleep study.  Await results.  4. Essential hypertension: Blood pressure well controlled today.  Continue current medications.  5. Hyperlipidemia: Tolerating simvastatin.  Managed by PCP.  Disposition: F/u with Dr. Saunders Revel in 3 months.   Current medicines are reviewed at length with the patient today.  The patient did not have any concerns regarding medicines.  Signed, Christell Faith, [name] 08/06/2017 3:43 PM     [address], [place] [address] [phone]

## 2017-08-15 ENCOUNTER — Ambulatory Visit: Payer: Medicare Other | Admitting: Nurse Practitioner

## 2017-08-22 ENCOUNTER — Other Ambulatory Visit: Payer: Self-pay | Admitting: Internal Medicine

## 2017-08-22 ENCOUNTER — Telehealth: Payer: Self-pay | Admitting: Family Medicine

## 2017-08-22 ENCOUNTER — Ambulatory Visit: Payer: Self-pay

## 2017-08-22 NOTE — Telephone Encounter (Signed)
See triage call encounter.

## 2017-08-22 NOTE — Telephone Encounter (Signed)
Copied from [place] [id number]. Topic: Quick Communication - See Telephone Encounter >> Aug 22, 2017 10:32 AM Roberto [name] wrote: CRM for notification. See Telephone encounter for:   Roberto [name] called saying he still has cough and congestion,  Bronchitis ,may be coming back.  And asking if can call in another z-pack, to make sure he is over this.   [place] (N), Roberto [name] - [address] [place]) [place] [address] Phone: [phone] Fax: [phone]    08/22/17.

## 2017-08-22 NOTE — Telephone Encounter (Signed)
Patient called because he called in requesting to have the doctor call him in something for a cough and spoke to daughter [name]. She says "he has been coughing for over a week. It was a dry cough, but now it's congested and he says nothing is coming up, but it tastes like sulfur when he coughs."  He says "it's hard for me to take a deep breath, I get short of breath when I exert myself, and my nose is running." He denies fever, chest pain. According to protocol, see PCP within 3 days, appointment made for Monday, February 25th with Kathrine Haddock, NP, care advice given, she verbalized understanding.  Reason for Disposition . Taking an ACE Inhibitor medication  (e.g., benazepril/LOTENSIN, captopril/CAPOTEN, enalapril/VASOTEC, lisinopril/ZESTRIL)    Congested cough present over a week  Answer Assessment - Initial Assessment Questions 1. ONSET: "When did the cough begin?"      About 1 week ago 2. SEVERITY: "How bad is the cough today?"      Any kind of exertion activates the cough 3. RESPIRATORY DISTRESS: "Describe your breathing."      SOB on exertion otherwise fine 4. FEVER: "Do you have a fever?" If so, ask: "What is your temperature, how was it measured, and when did it start?"     No 5. HEMOPTYSIS: "Are you coughing up any blood?" If so ask: "How much?" (flecks, streaks, tablespoons, etc.)     No 6. TREATMENT: "What have you done so far to treat the cough?" (e.g., meds, fluids, humidifier)     Just fluids 7. CARDIAC HISTORY: "Do you have any history of heart disease?" (e.g., heart attack, congestive heart failure)      Mechanical valve, CHF, TIA 8. LUNG HISTORY: "Do you have any history of lung disease?"  (e.g., pulmonary embolus, asthma, emphysema)     COPD 9. PE RISK FACTORS: "Do you have a history of blood clots?" (or: recent major surgery, recent prolonged travel, bedridden )     No 10. OTHER SYMPTOMS: "Do you have any other symptoms? (e.g., runny nose, wheezing, chest pain)       Runny  nose, cough, can't take a deep breath, SOB on exertion 11. PREGNANCY: "Is there any chance you are pregnant?" "When was your last menstrual period?"       N/A 12. TRAVEL: "Have you traveled out of the country in the last month?" (e.g., travel history, exposures)       No  Protocols used: COUGH - ACUTE NON-PRODUCTIVE-A-AH

## 2017-08-27 ENCOUNTER — Ambulatory Visit: Payer: Medicare Other | Admitting: Unknown Physician Specialty

## 2017-08-27 ENCOUNTER — Other Ambulatory Visit
Admission: RE | Admit: 2017-08-27 | Discharge: 2017-08-27 | Disposition: A | Discharge status: Home / Self Care | Payer: Medicare Other | Source: Ambulatory Visit | Attending: Physician Assistant | Admitting: Physician Assistant

## 2017-08-27 ENCOUNTER — Other Ambulatory Visit: Payer: Self-pay

## 2017-08-27 DIAGNOSIS — I350 Nonrheumatic aortic (valve) stenosis: Secondary | ICD-10-CM | POA: Insufficient documentation

## 2017-08-27 DIAGNOSIS — I5022 Chronic systolic (congestive) heart failure: Secondary | ICD-10-CM | POA: Insufficient documentation

## 2017-08-27 DIAGNOSIS — I110 Hypertensive heart disease with heart failure: Secondary | ICD-10-CM | POA: Diagnosis not present

## 2017-08-27 DIAGNOSIS — I428 Other cardiomyopathies: Secondary | ICD-10-CM | POA: Diagnosis not present

## 2017-08-27 DIAGNOSIS — I10 Essential (primary) hypertension: Secondary | ICD-10-CM

## 2017-08-27 LAB — BASIC METABOLIC PANEL
Anion gap: 7 (ref 5–15)
BUN: 18 mg/dL (ref 6–20)
CALCIUM: 8.5 mg/dL — AB (ref 8.9–10.3)
CO2: 24 mmol/L (ref 22–32)
CREATININE: 0.97 mg/dL (ref 0.61–1.24)
Chloride: 105 mmol/L (ref 101–111)
GLUCOSE: 119 mg/dL — AB (ref 65–99)
Potassium: 3.8 mmol/L (ref 3.5–5.1)
Sodium: 136 mmol/L (ref 135–145)

## 2017-08-27 MED ORDER — SPIRONOLACTONE 25 MG PO TABS: 12.5000 mg | ORAL_TABLET | Freq: Every day | ORAL | 3 refills | Status: DC

## 2017-08-28 ENCOUNTER — Ambulatory Visit: Payer: Medicare Other | Admitting: Family Medicine

## 2017-08-30 ENCOUNTER — Encounter: Payer: Self-pay | Admitting: Family Medicine

## 2017-08-30 ENCOUNTER — Telehealth: Payer: Self-pay | Admitting: Family Medicine

## 2017-08-30 ENCOUNTER — Ambulatory Visit (INDEPENDENT_AMBULATORY_CARE_PROVIDER_SITE_OTHER): Payer: Medicare Other | Admitting: Family Medicine

## 2017-08-30 VITALS — BP 114/73 | HR 64 | Temp 98.7°F | Wt 313.3 lb

## 2017-08-30 DIAGNOSIS — B370 Candidal stomatitis: Secondary | ICD-10-CM | POA: Diagnosis not present

## 2017-08-30 DIAGNOSIS — Z952 Presence of prosthetic heart valve: Secondary | ICD-10-CM | POA: Diagnosis not present

## 2017-08-30 DIAGNOSIS — H6121 Impacted cerumen, right ear: Secondary | ICD-10-CM

## 2017-08-30 DIAGNOSIS — J441 Chronic obstructive pulmonary disease with (acute) exacerbation: Secondary | ICD-10-CM

## 2017-08-30 DIAGNOSIS — L578 Other skin changes due to chronic exposure to nonionizing radiation: Secondary | ICD-10-CM

## 2017-08-30 LAB — COAGUCHEK XS/INR WAIVED
INR: 4.9 — ABNORMAL HIGH (ref 0.9–1.1)
PROTHROMBIN TIME: 58.9 s

## 2017-08-30 MED ORDER — TRIAMCINOLONE ACETONIDE 0.1 % EX CREA
1.0000 | TOPICAL_CREAM | Freq: Two times a day (BID) | CUTANEOUS | 0 refills | Status: DC
Start: 2017-08-30 — End: 2017-09-28

## 2017-08-30 MED ORDER — ALBUTEROL SULFATE (2.5 MG/3ML) 0.083% IN NEBU
2.5000 mg | INHALATION_SOLUTION | Freq: Once | RESPIRATORY_TRACT | Status: AC
  Administered 2017-08-30: 2.5 mg via RESPIRATORY_TRACT

## 2017-08-30 MED ORDER — NYSTATIN 100000 UNIT/ML MT SUSP
5.0000 mL | Freq: Four times a day (QID) | OROMUCOSAL | 0 refills | Status: DC
Start: 2017-08-30 — End: 2017-09-28

## 2017-08-30 MED ORDER — AZITHROMYCIN 250 MG PO TABS: ORAL_TABLET | ORAL | 0 refills | Status: DC

## 2017-08-30 NOTE — Telephone Encounter (Signed)
Patient was able to get it from another pharmacy.

## 2017-08-30 NOTE — Addendum Note (Signed)
Addended by: Valerie Roys on: 08/30/2017 11:42 AM   Modules accepted: Orders

## 2017-08-30 NOTE — Assessment & Plan Note (Signed)
Much better after neb. Will treat with azithromycin and nebs at home. Recheck next week. Call if not getting better or getting worse.

## 2017-08-30 NOTE — Patient Instructions (Signed)
Debrox- ear wax remover 

## 2017-08-30 NOTE — Progress Notes (Addendum)
BP 114/73   Pulse 64   Temp 98.7 F (37.1 C) (Oral)   Wt (!) 313 lb 4.8 oz (142.1 kg)   SpO2 99%   BMI 50.57 kg/m    Subjective:    Patient ID: Roberto [name], male    DOB: [birth year], 62 y.o.   MRN: [id number]  HPI: Keeton Kassebaum is a 62 y.o. male  Chief Complaint  Patient presents with  . Mechanical Heart Valve    PT/ INR- pt taking 4 mg, one day, 4 mg the next day, and 7.5 mg the next day. Alternating this pattern.   . URI    pt states he has had chest congstion for a couple of weeks. States they have tried OTC medications with no relief.    Coumadin Management.  The expected duration of coumadin treatment is lifelong The reason for anticoagulation is  mechanical heart valve.  Present Coumadin dose:4 mg, one day, 4 mg the next day, and 7.5 mg the next day. Goal: 2.5-3.5  Excessive bruising: no Nose bleeding: no Rectal bleeding: no Prolonged menstrual cycles: N/A Eating diet with consistent amounts of foods containing Vitamin K:yes Any recent antibiotic use? no  UPPER RESPIRATORY TRACT INFECTION Duration: 2-3 weeks Worst symptom: cough Fever: no Cough: yes Shortness of breath: yes Wheezing: yes Chest pain: yes, with cough Chest tightness: yes Chest congestion: yes Nasal congestion: no Runny nose: yes Post nasal drip: no Sneezing: no Sore throat: no Swollen glands: no Sinus pressure: no Headache: no Face pain: no Toothache: no Ear pain: no  Ear pressure: yes bilateral Eyes red/itching:no Eye drainage/crusting: no  Vomiting: no Rash: yes Fatigue: yes Sick contacts: no Strep contacts: no  Context: worse Recurrent sinusitis: no Relief with OTC cold/cough medications: no  Treatments attempted: cold/sinus, mucinex, anti-histamine and cough syrup   Relevant past medical, surgical, family and social history reviewed and updated as indicated. Interim medical history since our last visit reviewed. Allergies and medications reviewed and  updated.  Review of Systems  Constitutional: Negative.   HENT: Positive for ear pain and rhinorrhea. Negative for congestion, dental problem, drooling, ear discharge, facial swelling, hearing loss, mouth sores, nosebleeds, postnasal drip, sinus pressure, sinus pain, sneezing, sore throat, tinnitus, trouble swallowing and voice change.   Respiratory: Positive for cough, chest tightness, shortness of breath and wheezing. Negative for apnea, choking and stridor.   Cardiovascular: Negative.   Gastrointestinal: Negative.   Psychiatric/Behavioral: Negative.     Per HPI unless specifically indicated above     Objective:    BP 114/73   Pulse 64   Temp 98.7 F (37.1 C) (Oral)   Wt (!) 313 lb 4.8 oz (142.1 kg)   SpO2 99%   BMI 50.57 kg/m   Wt Readings from Last 3 Encounters:  08/30/17 (!) 313 lb 4.8 oz (142.1 kg)  08/06/17 (!) 307 lb 8 oz (139.5 kg)  07/31/17 (!) 303 lb 2 oz (137.5 kg)    Physical Exam  Constitutional: He is oriented to person, place, and time. He appears well-developed and well-nourished. No distress.  HENT:  Head: Normocephalic and atraumatic.  Right Ear: Hearing normal.  Left Ear: Hearing and external ear normal.  Nose: Nose normal.  Mouth/Throat: Oropharynx is clear and moist. No oropharyngeal exudate.  R cerumen impaction   Eyes: Conjunctivae, EOM and lids are normal. Pupils are equal, round, and reactive to light. Right eye exhibits no discharge. Left eye exhibits no discharge. No scleral icterus.  Neck: Normal range of motion. Neck  supple. No JVD present. No tracheal deviation present. No thyromegaly present.  Cardiovascular: Normal rate, regular rhythm and intact distal pulses. Exam reveals no gallop and no friction rub.  No murmur heard. Pulmonary/Chest: Effort normal. No stridor. No respiratory distress. He has decreased breath sounds in the right upper field, the right middle field, the right lower field, the left upper field, the left middle field and the  left lower field. He has wheezes in the right upper field, the right middle field, the right lower field, the left upper field, the left middle field and the left lower field. He has no rales. He exhibits no tenderness.  Musculoskeletal: Normal range of motion.  Lymphadenopathy:    He has no cervical adenopathy.  Neurological: He is alert and oriented to person, place, and time.  Skin: Skin is warm, dry and intact. No rash noted. He is not diaphoretic. No erythema. No pallor.  Thrush on tongue  Psychiatric: He has a normal mood and affect. His speech is normal and behavior is normal. Judgment and thought content normal. Cognition and memory are normal.  Nursing note and vitals reviewed.  Last PT: 58.9 Last INR: 4.8  Results for orders placed or performed during the hospital encounter of 88/32/54  Basic metabolic panel  Result Value Ref Range   Sodium 136 135 - 145 mmol/L   Potassium 3.8 3.5 - 5.1 mmol/L   Chloride 105 101 - 111 mmol/L   CO2 24 22 - 32 mmol/L   Glucose, Bld 119 (H) 65 - 99 mg/dL   BUN 18 6 - 20 mg/dL   Creatinine, Ser 0.97 0.61 - 1.24 mg/dL   Calcium 8.5 (L) 8.9 - 10.3 mg/dL   GFR calc non Af Amer >60 >60 mL/min   GFR calc Af Amer >60 >60 mL/min   Anion gap 7 5 - 15      Assessment & Plan:   Problem List Items Addressed This Visit      Respiratory   COPD (chronic obstructive pulmonary disease) (HCC)    Much better after neb. Will treat with azithromycin and nebs at home. Recheck next week. Call if not getting better or getting worse.       Relevant Medications   albuterol (PROVENTIL) (2.5 MG/3ML) 0.083% nebulizer solution 2.5 mg (Completed)   azithromycin (ZITHROMAX) 250 MG tablet     Other   Mechanical heart valve present - Primary    Will go to 4, 4, 6mg  and recheck 1 week.      Relevant Orders   CoaguChek XS/INR Waived    Other Visit Diagnoses    Solar dermatitis       Will treat with triamcinalone cream. Call if not getting better or getting  worse.    Hearing loss due to cerumen impaction, right       Ear flushed today, but not 100%. Will use debrox and will flush again next week.        Follow up plan: Return in about 1 year (around 08/30/2018) for INR, ear wax remover.

## 2017-08-30 NOTE — Telephone Encounter (Signed)
Unfortunately, the diflucan I would usually call in for him interacts with his coumadin and as his level is already high, I'm not comfortable using that. Please have him brush his tongue and use listerine, and if not better when I see him next week we'll see about getting something different if it's not off back order by then.

## 2017-08-30 NOTE — Assessment & Plan Note (Signed)
Will go to 4, 4, 6mg  and recheck 1 week.

## 2017-08-30 NOTE — Telephone Encounter (Signed)
Copied from [place]. Topic: Quick Communication - See Telephone Encounter >> Aug 30, 2017  2:13 PM Vernona Rieger wrote: CRM for notification. See Telephone encounter for:   08/30/17.  Pharmacy said that nystatin (MYCOSTATIN) 100000 UNIT/ML suspension is on back order. They need something in place of this. Please advise.  [place] (N), [place] - [place]

## 2017-09-06 ENCOUNTER — Ambulatory Visit: Payer: Medicare Other | Admitting: Family Medicine

## 2017-09-14 ENCOUNTER — Ambulatory Visit: Payer: Medicare Other | Admitting: Family Medicine

## 2017-09-19 ENCOUNTER — Other Ambulatory Visit: Payer: Self-pay | Admitting: Family Medicine

## 2017-09-21 ENCOUNTER — Ambulatory Visit: Payer: Medicare Other | Admitting: Family Medicine

## 2017-09-24 ENCOUNTER — Other Ambulatory Visit: Payer: Self-pay

## 2017-09-24 ENCOUNTER — Emergency Department
Admission: EM | Admit: 2017-09-24 | Discharge: 2017-09-24 | Disposition: A | Discharge status: Home / Self Care | Payer: Medicare Other | Attending: Emergency Medicine | Admitting: Emergency Medicine

## 2017-09-24 ENCOUNTER — Emergency Department: Payer: Medicare Other

## 2017-09-24 DIAGNOSIS — F17210 Nicotine dependence, cigarettes, uncomplicated: Secondary | ICD-10-CM | POA: Diagnosis not present

## 2017-09-24 DIAGNOSIS — M79641 Pain in right hand: Secondary | ICD-10-CM | POA: Insufficient documentation

## 2017-09-24 DIAGNOSIS — I5022 Chronic systolic (congestive) heart failure: Secondary | ICD-10-CM | POA: Insufficient documentation

## 2017-09-24 DIAGNOSIS — Z79899 Other long term (current) drug therapy: Secondary | ICD-10-CM | POA: Insufficient documentation

## 2017-09-24 DIAGNOSIS — E119 Type 2 diabetes mellitus without complications: Secondary | ICD-10-CM | POA: Diagnosis not present

## 2017-09-24 DIAGNOSIS — Z7982 Long term (current) use of aspirin: Secondary | ICD-10-CM | POA: Diagnosis not present

## 2017-09-24 DIAGNOSIS — I110 Hypertensive heart disease with heart failure: Secondary | ICD-10-CM | POA: Diagnosis not present

## 2017-09-24 DIAGNOSIS — J449 Chronic obstructive pulmonary disease, unspecified: Secondary | ICD-10-CM | POA: Insufficient documentation

## 2017-09-24 MED ORDER — HYDROCODONE-ACETAMINOPHEN 5-325 MG PO TABS
1.0000 | ORAL_TABLET | Freq: Once | ORAL | Status: AC
  Administered 2017-09-24: 1 via ORAL
  Filled 2017-09-24: qty 1

## 2017-09-24 MED ORDER — MELOXICAM 15 MG PO TABS: 15.0000 mg | ORAL_TABLET | Freq: Every day | ORAL | 3 refills | Status: DC

## 2017-09-24 NOTE — ED Provider Notes (Signed)
[facility] Emergency Department Provider Note  ____________________________________________   First MD Initiated Contact with Patient 09/24/17 2023     (approximate)  I have reviewed the triage vital signs and the nursing notes.   HISTORY  Chief Complaint Hand Pain    HPI Roberto [name] is a 62 y.o. male who presents emergency department complaining of right hand pain that started today.  He has no known injury.  States he has a history of a boxer's fracture from about 40 years ago.  He states the area is a little swollen but it is very tender to touch and he has pain with range of motion.  He denies any numbness or tingling.  Denies any recent IVs.  Past Medical History:  Diagnosis Date  . Bicuspid aortic valve    a. s/p #27 [employer]omed[employer] mechanical valve on 03/25/2010; b. on Coumadin; c. TTE 12/17: EF 40-45%, moderately dilated LV with moderate LVH, AVR well-seated with 14 mmHg gradient, peak AV velocity 2.5 m/s, mild mitral valve thickening with mild MR, mildly dilated RV with mildly reduced contraction  . Cellulitis   . Chronic systolic CHF (congestive heart failure) ([place])    a. R/LHC 03/2010 showed no significant CAD, LVEDP 31 mmHg, mean AoV gradient 34 mmHg at rest and 47 mmHg with dobutamine 20 mcg/kg/min, AVA 1.0 cm^2, RA 31, RV 68/25, PA 68/47, PCWP 38. PA sat 65%. CO 6.2 L/min ([place]) and 5.3 L/min (thermodilution)  . Clotting disorder ([place])   . H/O mechanical aortic valve replacement 03/25/2010   a. #27 Carbomedics mechanical valve  . Hypercholesterolemia   . Renal infarct [facility]) 2017   Multiple right renal infarcts, likely embolic.  . Stroke ([place])   . TIA (transient ischemic attack) 05/2014    Patient Active Problem List   Diagnosis Date Noted  . Chronic systolic heart failure ([place]) 09/27/2016  . Renal infarct ([place]) 05/01/2016  . Diabetes mellitus type 2, diet-controlled ([place]) 04/19/2016  . Chronic bilateral low back pain without sciatica  04/19/2016  . Thoracic ascending aortic aneurysm ([place]) 03/16/2016  . Aortic atherosclerosis ([place]) 03/16/2016  . Personal history of tobacco use, presenting hazards to health 03/14/2016  . NICM (nonischemic cardiomyopathy) ([place]) 03/02/2016  . COPD (chronic obstructive pulmonary disease) ([place]) 09/09/2015  . Mechanical heart valve present 02/09/2015  . Benign essential hypertension 02/09/2015  . Hyperlipidemia 02/09/2015    Past Surgical History:  Procedure Laterality Date  . AORTIC VALVE REPLACEMENT    . CARDIAC CATHETERIZATION  03/21/2010   No significant CAD. Severe aortic stenosis. Severely elevated left and right heart filling pressures.  Marland Kitchen CARDIAC SURGERY  2009   CHF  . CARPAL TUNNEL RELEASE Left 2005  . TONSILLECTOMY  1962    Prior to Admission medications   Medication Sig Start Date End Date Taking? Authorizing Provider  albuterol (PROVENTIL HFA;VENTOLIN HFA) 108 (90 Base) MCG/ACT inhaler Inhale 2 puffs into the lungs every 6 (six) hours as needed for wheezing or shortness of breath.    [provider]  aspirin 81 MG chewable tablet Chew 81 mg by mouth daily.      [provider]  azithromycin (ZITHROMAX) 250 MG tablet 2 tabs today, 1 tab daily for 4 days 08/30/17   Park Liter P, DO  cyclobenzaprine (FLEXERIL) 10 MG tablet Take 10 mg by mouth 2 (two) times daily.    [provider]  cyclobenzaprine (FLEXERIL) 10 MG tablet TAKE 1 TABLET BY MOUTH TWICE DAILY AS NEEDED FOR MUSCLE SPASM 09/19/17  [name], [name], DO  Fluticasone-Salmeterol (ADVAIR) 250-50 MCG/DOSE AEPB Inhale 1 puff into the lungs 2 (two) times daily as needed.    [provider]  furosemide (LASIX) 40 MG tablet Take 1 tablet (40 mg) by mouth once daily as needed    [provider]  furosemide (LASIX) 40 MG tablet TAKE 1 TABLET BY MOUTH ONCE DAILY 09/19/17   [name], [name], DO  lisinopril (PRINIVIL,ZESTRIL) 5 MG tablet Take 5 mg by mouth daily. 08/22/17   [provider]  meloxicam (MOBIC) 15 MG tablet Take 1 tablet (15 mg total) by mouth daily. 09/24/17   Valen Mascaro, Linden Dolin, [name]  metoprolol succinate (TOPROL-XL) 100 MG 24 hr tablet TAKE 1 TABLET BY MOUTH ONCE DAILY WITH  OR  IMMEDIATELY  FOLLOWING  A  MEAL 08/23/17   End, Harrell Gave, MD  nystatin (MYCOSTATIN) 100000 UNIT/ML suspension Take 5 mLs (500,000 Units total) by mouth 4 (four) times daily. 08/30/17   [name], [name], DO  simvastatin (ZOCOR) 40 MG tablet TAKE 1 TABLET BY MOUTH AT BEDTIME 04/03/17   [name], [name], DO  spironolactone (ALDACTONE) 25 MG tablet Take 0.5 tablets (12.5 mg total) by mouth daily. 08/27/17 11/25/17  Rise Mu, [name]  tiotropium (SPIRIVA) 18 MCG inhalation capsule Place 18 mcg into inhaler and inhale daily as needed.    [provider]  triamcinolone cream (KENALOG) 0.1 % Apply 1 application topically 2 (two) times daily. 08/30/17   Park Liter P, DO  warfarin (COUMADIN) 4 MG tablet Take as directed    [provider]  warfarin (COUMADIN) 7.5 MG tablet Take as directed    [provider]    Allergies Patient has no known allergies.  Family History  Problem Relation Age of Onset  . Arthritis Mother   . Cancer Mother        colon  . Arthritis Father   . Diabetes Father   . Stroke Father   . Cancer Father        colon  . Cancer Brother        brain  . Cancer Sister        breast  . Heart disease Neg Hx     Social History Social History   Tobacco Use  . Smoking status: Current Every Day Smoker    Packs/day: 0.50    Years: 40.00    Pack years: 20.00    Types: Cigarettes    Start date: 07/16/2017  . Smokeless tobacco: Never Used  . Tobacco comment: 3 cigarettes/ day  Substance Use Topics  . Alcohol use: No    Alcohol/week: 0.0 oz  . Drug use: No    Review of Systems  Constitutional: No fever/chills Eyes: No visual changes. ENT: No sore throat. Respiratory: Denies cough Genitourinary: Negative for  dysuria. Musculoskeletal: Negative for back pain.  Positive for right hand pain Skin: Negative for rash.    ____________________________________________   PHYSICAL EXAM:  VITAL SIGNS: ED Triage Vitals  Enc Vitals Group     BP 09/24/17 2004 123/75     Pulse Rate 09/24/17 2004 78     Resp 09/24/17 2004 19     Temp 09/24/17 2004 98.3 F (36.8 C)     Temp Source 09/24/17 2004 Oral     SpO2 09/24/17 2004 96 %     Weight 09/24/17 2005 300 lb (136.1 kg)     Height 09/24/17 2005 5\' 6"  (1.676 m)     Head Circumference --  Peak Flow --      Pain Score 09/24/17 2011 9     Pain Loc --      Pain Edu? --      Excl. in [place]? --     Constitutional: Alert and oriented. Well appearing and in no acute distress. Eyes: Conjunctivae are normal.  Head: Atraumatic. Nose: No congestion/rhinnorhea. Mouth/Throat: Mucous membranes are moist.   Cardiovascular: Normal rate, regular rhythm.  Heart sounds are normal Respiratory: Normal respiratory effort.  No retractions, lungs clear to auscultation GU: deferred Musculoskeletal: FROM all extremities, warm and well perfused, the right hand is tender at the fifth metacarpal.  He has full range of motion.  He is neurovascularly intact.  There is no redness or swelling noted Neurologic:  Normal speech and language.  Skin:  Skin is warm, dry and intact. No rash noted. Psychiatric: Mood and affect are normal. Speech and behavior are normal.  ____________________________________________   LABS (all labs ordered are listed, but only abnormal results are displayed)  Labs Reviewed - No data to display ____________________________________________   ____________________________________________  RADIOLOGY  X-ray of the right hand shows no acute abnormality  ____________________________________________   PROCEDURES  Procedure(s) performed: Ulnar gutter splint was applied by the  tech  Procedures    ____________________________________________   INITIAL IMPRESSION / ASSESSMENT AND PLAN / ED COURSE  Pertinent labs & imaging results that were available during my care of the patient were reviewed by me and considered in my medical decision making (see chart for details).  Patient is a 62 year old male complaining of right hand pain.  He had a boxer's fracture 40 years ago.  He has no known injury at this time.  On physical exam the right hand is tender to palpation along the fifth metacarpal.  He does have full range of motion but pain is reproduced with extension of the fingers  X-ray of the right hand is negative for an acute abnormality  Norco 5/325 p.o. was given in the ED.  Ulnar gutter splint was applied for comfort.  Explained to the patient that he should take this off in a few days.  By resting the joint it may help reduce the pain.  He states he understands will comply with instructions.  He is to follow-up with orthopedics or his regular doctor if not better in 3-5 days.  He was given a prescription for meloxicam for pain.  He was discharged in stable condition     As part of my medical decision making, I reviewed the following data within the [place] notes reviewed and incorporated, Old chart reviewed, Radiograph reviewed x-ray of the right hand is negative, Notes from prior ED visits and [place] Controlled Substance Database  ____________________________________________   FINAL CLINICAL IMPRESSION(S) / ED DIAGNOSES  Final diagnoses:  Hand pain, right      NEW MEDICATIONS STARTED DURING THIS VISIT:  Discharge Medication List as of 09/24/2017  9:12 PM    START taking these medications   Details  meloxicam (MOBIC) 15 MG tablet Take 1 tablet (15 mg total) by mouth daily., Starting Mon 09/24/2017, Print         Note:  This document was prepared using Dragon voice recognition software and may include unintentional  dictation errors.    Versie Starks, [name] 09/24/17 2154    Schuyler Amor, MD 10/05/17 [phone]

## 2017-09-24 NOTE — ED Notes (Signed)
XR at bedside

## 2017-09-24 NOTE — ED Triage Notes (Signed)
Pt arrives to ED from home via POV with c/o RIGHT hand pain x1 day. Pt denies any known recent injury or trauma; pt reports h/x of Boxer's f/x in same hand 40 yrs ago. CMS intact.

## 2017-09-28 ENCOUNTER — Encounter: Payer: Self-pay | Admitting: Family Medicine

## 2017-09-28 ENCOUNTER — Ambulatory Visit (INDEPENDENT_AMBULATORY_CARE_PROVIDER_SITE_OTHER): Payer: Medicare Other | Admitting: Family Medicine

## 2017-09-28 VITALS — BP 108/71 | HR 80 | Temp 98.3°F

## 2017-09-28 DIAGNOSIS — M10041 Idiopathic gout, right hand: Secondary | ICD-10-CM | POA: Diagnosis not present

## 2017-09-28 DIAGNOSIS — Z952 Presence of prosthetic heart valve: Secondary | ICD-10-CM

## 2017-09-28 DIAGNOSIS — J441 Chronic obstructive pulmonary disease with (acute) exacerbation: Secondary | ICD-10-CM

## 2017-09-28 MED ORDER — PREDNISONE 50 MG PO TABS: 50.0000 mg | ORAL_TABLET | Freq: Every day | ORAL | 0 refills | Status: DC

## 2017-09-28 NOTE — Assessment & Plan Note (Signed)
Under good control. Continue to monitor. Call with any concerns.  

## 2017-09-28 NOTE — Patient Instructions (Signed)
Low-Purine Diet  Purines are compounds that affect the level of uric acid in your body. A low-purine diet is a diet that is low in purines. Eating a low-purine diet can prevent the level of uric acid in your body from getting too high and causing gout or kidney stones or both.  What do I need to know about this diet?   Choose low-purine foods. Examples of low-purine foods are listed in the next section.   Drink plenty of fluids, especially water. Fluids can help remove uric acid from your body. Try to drink 8-16 cups (1.9-3.8 L) a day.   Limit foods high in fat, especially saturated fat, as fat makes it harder for the body to get rid of uric acid. Foods high in saturated fat include pizza, cheese, ice cream, whole milk, fried foods, and gravies. Choose foods that are lower in fat and lean sources of protein. Use olive oil when cooking as it contains healthy fats that are not high in saturated fat.   Limit alcohol. Alcohol interferes with the elimination of uric acid from your body. If you are having a gout attack, avoid all alcohol.   Keep in mind that different people's bodies react differently to different foods. You will probably learn over time which foods do or do not affect you. If you discover that a food tends to cause your gout to flare up, avoid eating that food. You can more freely enjoy foods that do not cause problems. If you have any questions about a food item, talk to your dietitian or health care provider.  Which foods are low, moderate, and high in purines?  The following is a list of foods that are low, moderate, and high in purines. You can eat any amount of the foods that are low in purines. You may be able to have small amounts of foods that are moderate in purines. Ask your health care provider how much of a food moderate in purines you can have. Avoid foods high in purines.  Grains   Foods low in purines: Enriched white bread, pasta, rice, cake, cornbread, popcorn.   Foods moderate in  purines: Whole-grain breads and cereals, wheat germ, bran, oatmeal. Uncooked oatmeal. Dry wheat bran or wheat germ.   Foods high in purines: Pancakes, French toast, biscuits, muffins.  Vegetables   Foods low in purines: All vegetables, except those that are moderate in purines.   Foods moderate in purines: Asparagus, cauliflower, spinach, mushrooms, green peas.  Fruits   All fruits are low in purines.  Meats and other Protein Foods   Foods low in purines: Eggs, nuts, peanut butter.   Foods moderate in purines: 80-90% lean beef, lamb, veal, pork, poultry, fish, eggs, peanut butter, nuts. Crab, lobster, oysters, and shrimp. Cooked dried beans, peas, and lentils.   Foods high in purines: Anchovies, sardines, herring, mussels, tuna, codfish, scallops, trout, and haddock. Bacon. Organ meats (such as liver or kidney). Tripe. Game meat. Goose. Sweetbreads.  Dairy   All dairy foods are low in purines. Low-fat and fat-free dairy products are best because they are low in saturated fat.  Beverages   Drinks low in purines: Water, carbonated beverages, tea, coffee, cocoa.   Drinks moderate in purines: Soft drinks and other drinks sweetened with high-fructose corn syrup. Juices. To find whether a food or drink is sweetened with high-fructose corn syrup, look at the ingredients list.   Drinks high in purines: Alcoholic beverages (such as beer).  Condiments   Foods   low in purines: Salt, herbs, olives, pickles, relishes, vinegar.   Foods moderate in purines: Butter, margarine, oils, mayonnaise.  Fats and Oils   Foods low in purines: All types, except gravies and sauces made with meat.   Foods high in purines: Gravies and sauces made with meat.  Other Foods   Foods low in purines: Sugars, sweets, gelatin. Cake. Soups made without meat.   Foods moderate in purines: Meat-based or fish-based soups, broths, or bouillons. Foods and drinks sweetened with high-fructose corn syrup.   Foods high in purines: High-fat desserts  (such as ice cream, cookies, cakes, pies, doughnuts, and chocolate).  Contact your dietitian for more information on foods that are not listed here.  This information is not intended to replace advice given to you by your health care provider. Make sure you discuss any questions you have with your health care provider.  Document Released: 10/14/2010 Document Revised: 11/25/2015 Document Reviewed: 05/26/2013  Elsevier Interactive Patient Education  2017 [employer]      Gout  Gout is painful swelling that can occur in some of your joints. Gout is a type of arthritis. This condition is caused by having too much uric acid in your body. Uric acid is a chemical that forms when your body breaks down substances called purines. Purines are important for building body proteins.  When your body has too much uric acid, sharp crystals can form and build up inside your joints. This causes pain and swelling. Gout attacks can happen quickly and be very painful (acute gout). Over time, the attacks can affect more joints and become more frequent (chronic gout). Gout can also cause uric acid to build up under your skin and inside your kidneys.  What are the causes?  This condition is caused by too much uric acid in your blood. This can occur because:   Your kidneys do not remove enough uric acid from your blood. This is the most common cause.   Your body makes too much uric acid. This can occur with some cancers and cancer treatments. It can also occur if your body is breaking down too many red blood cells (hemolytic anemia).   You eat too many foods that are high in purines. These foods include organ meats and some seafood. Alcohol, especially beer, is also high in purines.    A gout attack may be triggered by trauma or stress.  What increases the risk?  This condition is more likely to develop in people who:   Have a family history of gout.   Are male and middle-aged.   Are male and have gone through menopause.   Are  obese.   Frequently drink alcohol, especially beer.   Are dehydrated.   Lose weight too quickly.   Have an organ transplant.   Have lead poisoning.   Take certain medicines, including aspirin, cyclosporine, diuretics, levodopa, and niacin.   Have kidney disease or psoriasis.    What are the signs or symptoms?  An attack of acute gout happens quickly. It usually occurs in just one joint. The most common place is the big toe. Attacks often start at night. Other joints that may be affected include joints of the feet, ankle, knee, fingers, wrist, or elbow. Symptoms may include:   Severe pain.   Warmth.   Swelling.   Stiffness.   Tenderness. The affected joint may be very painful to touch.   Shiny, red, or purple skin.   Chills and fever.      Chronic gout may cause symptoms more frequently. More joints may be involved. You may also have white or yellow lumps (tophi) on your hands or feet or in other areas near your joints.  How is this diagnosed?  This condition is diagnosed based on your symptoms, medical history, and physical exam. You may have tests, such as:   Blood tests to measure uric acid levels.   Removal of joint fluid with a needle (aspiration) to look for uric acid crystals.   X-rays to look for joint damage.    How is this treated?  Treatment for this condition has two phases: treating an acute attack and preventing future attacks. Acute gout treatment may include medicines to reduce pain and swelling, including:   NSAIDs.   Steroids. These are strong anti-inflammatory medicines that can be taken by mouth (orally) or injected into a joint.   Colchicine. This medicine relieves pain and swelling when it is taken soon after an attack. It can be given orally or through an IV tube.    Preventive treatment may include:   Daily use of smaller doses of NSAIDs or colchicine.   Use of a medicine that reduces uric acid levels in your blood.   Changes to your diet. You may need to see a specialist  about healthy eating (dietitian).    Follow these instructions at home:  During a Gout Attack   If directed, apply ice to the affected area:  ? Put ice in a plastic bag.  ? Place a towel between your skin and the bag.  ? Leave the ice on for 20 minutes, 2-3 times a day.   Rest the joint as much as possible. If the affected joint is in your leg, you may be given crutches to use.   Raise (elevate) the affected joint above the level of your heart as often as possible.   Drink enough fluids to keep your urine clear or pale yellow.   Take over-the-counter and prescription medicines only as told by your health care provider.   Do not drive or operate heavy machinery while taking prescription pain medicine.   Follow instructions from your health care provider about eating or drinking restrictions.   Return to your normal activities as told by your health care provider. Ask your health care provider what activities are safe for you.  Avoiding Future Gout Attacks   Follow a low-purine diet as told by your dietitian or health care provider. Avoid foods and drinks that are high in purines, including liver, kidney, anchovies, asparagus, herring, mushrooms, mussels, and beer.   Limit alcohol intake to no more than 1 drink a day for nonpregnant women and 2 drinks a day for men. One drink equals 12 oz of beer, 5 oz of wine, or 1 oz of hard liquor.   Maintain a healthy weight or lose weight if you are overweight. If you want to lose weight, talk with your health care provider. It is important that you do not lose weight too quickly.   Start or maintain an exercise program as told by your health care provider.   Drink enough fluids to keep your urine clear or pale yellow.   Take over-the-counter and prescription medicines only as told by your health care provider.   Keep all follow-up visits as told by your health care provider. This is important.  Contact a health care provider if:   You have another gout  attack.   You continue to have symptoms of   a gout attack after10 days of treatment.   You have side effects from your medicines.   You have chills or a fever.   You have burning pain when you urinate.   You have pain in your lower back or belly.  Get help right away if:   You have severe or uncontrolled pain.   You cannot urinate.  This information is not intended to replace advice given to you by your health care provider. Make sure you discuss any questions you have with your health care provider.  Document Released: 06/16/2000 Document Revised: 11/25/2015 Document Reviewed: 04/01/2015  Elsevier Interactive Patient Education  2018 [employer]

## 2017-09-28 NOTE — Progress Notes (Signed)
BP 108/71 (BP Location: Left Arm, Patient Position: Sitting, Cuff Size: Large)   Pulse 80   Temp 98.3 F (36.8 C)   SpO2 94%    Subjective:    Patient ID: Roberto [name], male    DOB: 1956-03-23, 62 y.o.   MRN: [id number]  HPI: Roberto [name] is a 62 y.o. male  Chief Complaint  Patient presents with  . lung check  . Hand Pain  . PTINR   Coumadin Management.  The expected duration of coumadin treatment is lifelong The reason for anticoagulation is  mechanical heart valve.  Present Coumadin dose: 7.5, 4, 4, repeat Goal: 2.5-3.5  Excessive bruising: no Nose bleeding: no Rectal bleeding: no Prolonged menstrual cycles: N/A Eating diet with consistent amounts of foods containing Vitamin K:yes Any recent antibiotic use? no  ER FOLLOW UP Time since discharge: 4 days ago  Hospital/facility: [facility] Diagnosis: Hand pain Procedures/tests: x-ray Consultants: None New medications: norco and meloxicam Discharge instructions:  Follow up here Status: stable- still hurts when he bumps it on anything. Can't straighten his finger, very tight, red, hot and swollen  Breathing is doing better. No wheezing. No other concerns.   Relevant past medical, surgical, family and social history reviewed and updated as indicated. Interim medical history since our last visit reviewed. Allergies and medications reviewed and updated.  Review of Systems  Constitutional: Negative.   Respiratory: Negative.   Cardiovascular: Negative.   Musculoskeletal: Positive for arthralgias and myalgias. Negative for back pain, gait problem, joint swelling, neck pain and neck stiffness.  Skin: Positive for color change. Negative for pallor, rash and wound.  Neurological: Negative.   Psychiatric/Behavioral: Negative.     Per HPI unless specifically indicated above     Objective:    BP 108/71 (BP Location: Left Arm, Patient Position: Sitting, Cuff Size: Large)   Pulse 80   Temp 98.3 F (36.8 C)   SpO2 94%    Wt Readings from Last 3 Encounters:  09/24/17 300 lb (136.1 kg)  08/30/17 (!) 313 lb 4.8 oz (142.1 kg)  08/06/17 (!) 307 lb 8 oz (139.5 kg)    Physical Exam  Constitutional: He is oriented to person, place, and time. He appears well-developed and well-nourished. No distress.  HENT:  Head: Normocephalic and atraumatic.  Right Ear: Hearing normal.  Left Ear: Hearing normal.  Nose: Nose normal.  Eyes: Conjunctivae and lids are normal. Right eye exhibits no discharge. Left eye exhibits no discharge. No scleral icterus.  Cardiovascular: Normal rate, regular rhythm, normal heart sounds and intact distal pulses. Exam reveals no gallop and no friction rub.  No murmur heard. Pulmonary/Chest: Effort normal and breath sounds normal. No respiratory distress. He has no wheezes. He has no rales. He exhibits no tenderness.  Musculoskeletal: Normal range of motion. He exhibits edema (erythematous, swollen, red 5th MCP on the R).  Neurological: He is alert and oriented to person, place, and time.  Skin: Skin is warm, dry and intact. No rash noted. He is not diaphoretic. No erythema. No pallor.  Psychiatric: He has a normal mood and affect. His speech is normal and behavior is normal. Judgment and thought content normal. Cognition and memory are normal.  Nursing note and vitals reviewed.   Results for orders placed or performed in visit on 08/30/17  CoaguChek XS/INR Waived  Result Value Ref Range   INR 4.9 (H) 0.9 - 1.1   Prothrombin Time 58.9 sec      Assessment & Plan:   Problem List  Items Addressed This Visit      Respiratory   COPD (chronic obstructive pulmonary disease) ([place])    Under good control. Continue to monitor. Call with any concerns.       Relevant Medications   predniSONE (DELTASONE) 50 MG tablet     Other   Mechanical heart valve present    INR 3.4- under good controls. Continue to monitor. Call with any concerns.        Other Visit Diagnoses    Acute idiopathic gout  of right hand    -  Primary   Will treat with prednisone. Call with any concerns. Checking CMP and uric acid. Await results.    Relevant Orders   Uric acid   Comprehensive metabolic panel       Follow up plan: Return in about 1 month (around 10/26/2017) for INR.

## 2017-09-28 NOTE — Assessment & Plan Note (Signed)
INR 3.4- under good controls. Continue to monitor. Call with any concerns.

## 2017-09-29 LAB — URIC ACID: Uric Acid: 9.3 mg/dL — ABNORMAL HIGH (ref 3.7–8.6)

## 2017-09-29 LAB — COMPREHENSIVE METABOLIC PANEL
ALBUMIN: 3.9 g/dL (ref 3.6–4.8)
ALK PHOS: 57 IU/L (ref 39–117)
ALT: 19 IU/L (ref 0–44)
AST: 30 IU/L (ref 0–40)
Albumin/Globulin Ratio: 1.1 — ABNORMAL LOW (ref 1.2–2.2)
BUN / CREAT RATIO: 20 (ref 10–24)
BUN: 19 mg/dL (ref 8–27)
Bilirubin Total: 0.2 mg/dL (ref 0.0–1.2)
CHLORIDE: 104 mmol/L (ref 96–106)
CO2: 24 mmol/L (ref 20–29)
CREATININE: 0.94 mg/dL (ref 0.76–1.27)
Calcium: 9 mg/dL (ref 8.6–10.2)
GFR calc Af Amer: 99 mL/min/{1.73_m2} (ref >59)
GFR calc non Af Amer: 86 mL/min/{1.73_m2} (ref >59)
GLOBULIN, TOTAL: 3.4 g/dL (ref 1.5–4.5)
GLUCOSE: 129 mg/dL — AB (ref 65–99)
Potassium: 4.1 mmol/L (ref 3.5–5.2)
SODIUM: 143 mmol/L (ref 134–144)
Total Protein: 7.3 g/dL (ref 6.0–8.5)

## 2017-10-01 DIAGNOSIS — Z952 Presence of prosthetic heart valve: Secondary | ICD-10-CM | POA: Diagnosis not present

## 2017-10-01 LAB — COAGUCHEK XS/INR WAIVED
INR: 3.4 — ABNORMAL HIGH (ref 0.9–1.1)
PROTHROMBIN TIME: 41.4 s

## 2017-10-01 NOTE — Addendum Note (Signed)
Addended by: Valerie Roys on: 10/01/2017 08:47 AM   Modules accepted: Orders

## 2017-10-02 ENCOUNTER — Telehealth: Payer: Self-pay | Admitting: Family Medicine

## 2017-10-02 MED ORDER — ALLOPURINOL 100 MG PO TABS: 100.0000 mg | ORAL_TABLET | Freq: Every day | ORAL | 6 refills | Status: DC

## 2017-10-02 NOTE — Telephone Encounter (Signed)
Please let him know it does look like he's having a gout flare, so I sent him some medicine to help prevent it to his pharmacy. Everything else looks good.

## 2017-10-03 NOTE — Telephone Encounter (Signed)
Message relayed to patient. Verbalized understanding and denied questions.   

## 2017-10-23 ENCOUNTER — Other Ambulatory Visit: Payer: Self-pay | Admitting: Family Medicine

## 2017-10-26 ENCOUNTER — Encounter: Payer: Self-pay | Admitting: Family Medicine

## 2017-10-26 ENCOUNTER — Ambulatory Visit (INDEPENDENT_AMBULATORY_CARE_PROVIDER_SITE_OTHER): Payer: Medicare Other | Admitting: Family Medicine

## 2017-10-26 VITALS — BP 105/68 | HR 80 | Wt 314.2 lb

## 2017-10-26 DIAGNOSIS — Z952 Presence of prosthetic heart valve: Secondary | ICD-10-CM

## 2017-10-26 LAB — COAGUCHEK XS/INR WAIVED
INR: 3.9 — ABNORMAL HIGH (ref 0.9–1.1)
PROTHROMBIN TIME: 47.3 s

## 2017-10-26 NOTE — Progress Notes (Signed)
   BP 105/68 (BP Location: Left Arm, Patient Position: Sitting, Cuff Size: Large)   Pulse 80   Wt (!) 314 lb 4 oz (142.5 kg)   SpO2 97%   BMI 50.72 kg/m    Subjective:    Patient ID: Roberto [name], male    DOB: 1956/06/19, 62 y.o.   MRN: [id number]  CC: Coumadin management  HPI: This patient is a 62 y.o. male who presents for coumadin management. The expected duration of coumadin treatment is lifelong The reason for anticoagulation is  mechanical heart valve.  Present Coumadin dose: 7.5, 4,4, repeat Goal: 2.5-3.5  Excessive bruising: no Nose bleeding: no Rectal bleeding: no Prolonged menstrual cycles: N/A Eating diet with consistent amounts of foods containing Vitamin K:yes Any recent antibiotic use? no  Relevant past medical, surgical, family and social history reviewed and updated as indicated. Interim medical history since our last visit reviewed. Allergies and medications reviewed and updated.  ROS: Per HPI unless specifically indicated above     Objective:    BP 105/68 (BP Location: Left Arm, Patient Position: Sitting, Cuff Size: Large)   Pulse 80   Wt (!) 314 lb 4 oz (142.5 kg)   SpO2 97%   BMI 50.72 kg/m   Wt Readings from Last 3 Encounters:  10/26/17 (!) 314 lb 4 oz (142.5 kg)  09/24/17 300 lb (136.1 kg)  08/30/17 (!) 313 lb 4.8 oz (142.1 kg)     General: Well appearing, well nourished in no distress.  Normal mood and affect. Skin: No excessive bruising or rash  Last INR: 3.9 Last PT: 47.3    Last CBC:  Lab Results  Component Value Date   WBC 8.2 07/27/2017   HGB 16.1 07/27/2017   HCT 48.9 07/27/2017   MCV 86.6 07/27/2017   PLT 245 07/27/2017    Results for orders placed or performed in visit on 09/28/17  Uric acid  Result Value Ref Range   Uric Acid 9.3 (H) 3.7 - 8.6 mg/dL  Comprehensive metabolic panel  Result Value Ref Range   Glucose 129 (H) 65 - 99 mg/dL   BUN 19 8 - 27 mg/dL   Creatinine, Ser 0.94 0.76 - 1.27 mg/dL   GFR calc non Af  Amer 86 >59 mL/min/1.73   GFR calc Af Amer 99 >59 mL/min/1.73   BUN/Creatinine Ratio 20 10 - 24   Sodium 143 134 - 144 mmol/L   Potassium 4.1 3.5 - 5.2 mmol/L   Chloride 104 96 - 106 mmol/L   CO2 24 20 - 29 mmol/L   Calcium 9.0 8.6 - 10.2 mg/dL   Total Protein 7.3 6.0 - 8.5 g/dL   Albumin 3.9 3.6 - 4.8 g/dL   Globulin, Total 3.4 1.5 - 4.5 g/dL   Albumin/Globulin Ratio 1.1 (L) 1.2 - 2.2   Bilirubin Total 0.2 0.0 - 1.2 mg/dL   Alkaline Phosphatase 57 39 - 117 IU/L   AST 30 0 - 40 IU/L   ALT 19 0 - 44 IU/L  CoaguChek XS/INR Waived  Result Value Ref Range   INR 3.4 (H) 0.9 - 1.1   Prothrombin Time 41.4 sec       Assessment:     ICD-10-CM   1. Mechanical heart valve present Z95.2 CoaguChek XS/INR Waived    CANCELED: INR/PT    Plan:   Discussed current plan face-to-face with patient. For coumadin dosing, elected to continue current dose. Will plan to recheck INR in 2 weeks.

## 2017-10-30 ENCOUNTER — Ambulatory Visit: Payer: Medicare Other | Admitting: Internal Medicine

## 2017-11-08 ENCOUNTER — Ambulatory Visit: Payer: Medicare Other | Admitting: Internal Medicine

## 2017-11-09 ENCOUNTER — Other Ambulatory Visit: Payer: Self-pay | Admitting: Nurse Practitioner

## 2017-11-09 ENCOUNTER — Ambulatory Visit (INDEPENDENT_AMBULATORY_CARE_PROVIDER_SITE_OTHER): Payer: Medicare Other | Admitting: Family Medicine

## 2017-11-09 ENCOUNTER — Encounter: Payer: Self-pay | Admitting: Family Medicine

## 2017-11-09 ENCOUNTER — Encounter: Payer: Self-pay | Admitting: Nurse Practitioner

## 2017-11-09 VITALS — BP 99/72 | HR 72 | Temp 98.3°F | Wt 312.2 lb

## 2017-11-09 DIAGNOSIS — J432 Centrilobular emphysema: Secondary | ICD-10-CM

## 2017-11-09 DIAGNOSIS — Z952 Presence of prosthetic heart valve: Secondary | ICD-10-CM | POA: Diagnosis not present

## 2017-11-09 DIAGNOSIS — J441 Chronic obstructive pulmonary disease with (acute) exacerbation: Secondary | ICD-10-CM | POA: Diagnosis not present

## 2017-11-09 LAB — COAGUCHEK XS/INR WAIVED
INR: 2.8 — ABNORMAL HIGH (ref 0.9–1.1)
PROTHROMBIN TIME: 34 s

## 2017-11-09 MED ORDER — PREDNISONE 50 MG PO TABS
50.0000 mg | ORAL_TABLET | Freq: Every day | ORAL | 0 refills | Status: AC
Start: 2017-11-09 — End: 2017-11-14

## 2017-11-09 NOTE — Assessment & Plan Note (Signed)
Will treat with prednisone- mild. Call with any concerns or if not getting better. Continue to monitor.

## 2017-11-09 NOTE — Progress Notes (Addendum)
BP 99/72 (BP Location: Right Arm, Patient Position: Sitting, Cuff Size: Large)   Pulse 72   Temp 98.3 F (36.8 C) (Oral)   Wt (!) 312 lb 3.2 oz (141.6 kg)   SpO2 95%   BMI 50.39 kg/m    Subjective:    Patient ID: Roberto [name], male    DOB: 22-Sep-1955, 63 y.o.   MRN: [id number]  HPI: Roberto [name] is a 62 y.o. male  Chief Complaint  Patient presents with  . Coagulation Disorder    PT/INR F/U  . Cough    Requesting possible prednisone  . Wheezing   UPPER RESPIRATORY TRACT INFECTION Worst symptom: wheezing Fever: no Cough: yes Shortness of breath: yes Wheezing: yes Chest pain: no Chest tightness: yes Chest congestion: yes Nasal congestion: no Runny nose: no Post nasal drip: no Sneezing: no Sore throat: no Swollen glands: no Sinus pressure: no Headache: no Face pain: no Toothache: no Ear pain: no  Ear pressure: no  Eyes red/itching:no Eye drainage/crusting: no  Vomiting: no Rash: no Fatigue: no Sick contacts: no Strep contacts: no  Context: worse Recurrent sinusitis: no Relief with OTC cold/cough medications: no  Treatments attempted: none   Coumadin Management.  The expected duration of coumadin treatment is lifelong The reason for anticoagulation is  mechanical heart valve.  Present Coumadin dose: 7.5, 4, 4, repeat Goal: 2.5-3.5  Excessive bruising: no Nose bleeding: no Rectal bleeding: no Prolonged menstrual cycles: N/A Eating diet with consistent amounts of foods containing Vitamin K:no Any recent antibiotic use? no  Relevant past medical, surgical, family and social history reviewed and updated as indicated. Interim medical history since our last visit reviewed. Allergies and medications reviewed and updated.  Review of Systems  Constitutional: Negative.   HENT: Negative.   Respiratory: Positive for cough, chest tightness, shortness of breath and wheezing. Negative for apnea, choking and stridor.   Cardiovascular: Negative.     Psychiatric/Behavioral: Negative.     Per HPI unless specifically indicated above     Objective:    BP 99/72 (BP Location: Right Arm, Patient Position: Sitting, Cuff Size: Large)   Pulse 72   Temp 98.3 F (36.8 C) (Oral)   Wt (!) 312 lb 3.2 oz (141.6 kg)   SpO2 95%   BMI 50.39 kg/m   Wt Readings from Last 3 Encounters:  11/09/17 (!) 312 lb 3.2 oz (141.6 kg)  10/26/17 (!) 314 lb 4 oz (142.5 kg)  09/24/17 300 lb (136.1 kg)    Physical Exam  Constitutional: He is oriented to person, place, and time. He appears well-developed and well-nourished. No distress.  HENT:  Head: Normocephalic and atraumatic.  Right Ear: Hearing normal.  Left Ear: Hearing normal.  Nose: Nose normal.  Eyes: Conjunctivae and lids are normal. Right eye exhibits no discharge. Left eye exhibits no discharge. No scleral icterus.  Cardiovascular: Normal rate, regular rhythm, normal heart sounds and intact distal pulses. Exam reveals no gallop and no friction rub.  No murmur heard. Pulmonary/Chest: Effort normal. No stridor. No respiratory distress. He has wheezes. He has no rales. He exhibits no tenderness.  Musculoskeletal: Normal range of motion.  Neurological: He is alert and oriented to person, place, and time.  Skin: Skin is warm, dry and intact. Capillary refill takes less than 2 seconds. No rash noted. He is not diaphoretic. No erythema. No pallor.  Psychiatric: He has a normal mood and affect. His speech is normal and behavior is normal. Judgment and thought content normal. Cognition and memory  are normal.    Results for orders placed or performed in visit on 10/26/17  CoaguChek XS/INR Waived  Result Value Ref Range   INR 3.9 (H) 0.9 - 1.1   Prothrombin Time 47.3 sec      Assessment & Plan:   Problem List Items Addressed This Visit      Respiratory   COPD (chronic obstructive pulmonary disease) ([place])    Will treat with prednisone- mild. Call with any concerns or if not getting better.  Continue to monitor.         Other   Mechanical heart valve present - Primary    Under good control with INR of 2.8- continue current regimen. Continue to monitor. Recheck 1 month.        Other Visit Diagnoses    History of heart valve replacement with mechanical valve       Relevant Orders   CoaguChek XS/INR Waived       Follow up plan: Return in about 1 month (around 12/07/2017).

## 2017-11-09 NOTE — Assessment & Plan Note (Addendum)
Under good control with INR of 2.8- continue current regimen. Continue to monitor. Recheck 1 month.

## 2017-11-09 NOTE — Progress Notes (Signed)
Phone call from [place] reporting pt was told to start prednisone today at his appointment with Dr. Wynetta Emery, but it was not called or sent to his pharmacy. Office visit note reviewed.  Will send prednisone 50 mg once daily for 5 days.

## 2017-11-15 ENCOUNTER — Telehealth: Payer: Self-pay | Admitting: Family Medicine

## 2017-11-15 NOTE — Telephone Encounter (Signed)
Copied from [place] [phone]. Topic: Quick Communication - Rx Refill/Question >> Nov 15, 2017 10:39 AM Lennox Solders wrote: Medication: prednisone 50 mg #5  Has the patient contacted their pharmacy? No {(Agent: If no, request that the patient contact the pharmacy for the refill.) Preferred Pharmacy (with phone number or street name): [facility] [address] [place] Agent: Please be advised that RX refills may take up to 3 business days. We ask that you follow-up with your pharmacy.

## 2017-11-15 NOTE — Telephone Encounter (Signed)
Patient called and confirmed that he will be in the office tomorrow, 11/16/17 at 9:45am., the appt Dr. Wynetta Emery offered him.  The [facility] agents cannot override an appt, so someone from the practice will have to put the patient in that slot.

## 2017-11-15 NOTE — Telephone Encounter (Signed)
LVM to offer pt an appt. Dr Wynetta Emery said he could come in at 9:45am 11/16/17.

## 2017-11-16 ENCOUNTER — Ambulatory Visit: Payer: Medicare Other | Admitting: Family Medicine

## 2017-11-30 ENCOUNTER — Telehealth: Payer: Self-pay | Admitting: Family Medicine

## 2017-11-30 NOTE — Telephone Encounter (Signed)
This encounter was created in error - please disregard.

## 2017-11-30 NOTE — Telephone Encounter (Signed)
Patient called, left VM to return call back to the office to discuss the request of Prednisone.

## 2017-11-30 NOTE — Telephone Encounter (Signed)
Copied from [place] [phone]. Topic: Quick Communication - Rx Refill/Question >> Nov 30, 2017  9:36 AM Boyd Kerbs wrote: Medication: prednisone 50 mg #5  Taking it for 5 days and is out, he is still having symptoms. Congested. Asking for refill or something to help get over this  Has the patient contacted their pharmacy? No. (Agent: If no, request that the patient contact the pharmacy for the refill.) (Agent: If yes, when and what did the pharmacy advise?)  Preferred Pharmacy (with phone number or street name):   [facility] ([address] [address]) [place] [address] Phone: [phone] Fax: [phone]    Agent: Please be advised that RX refills may take up to 3 business days. We ask that you follow-up with your pharmacy.

## 2017-12-01 NOTE — Telephone Encounter (Signed)
Needs appointment

## 2017-12-03 NOTE — Telephone Encounter (Signed)
Spoke with [name] regarding this but she states she does not understand why this cannot be done as per them Dr Wynetta Emery asked them to call if more was needed. I explained Dr Wynetta Emery states the patient needs to be seen.  He will keep his appointment on Friday.

## 2017-12-03 NOTE — Telephone Encounter (Signed)
Please get patient scheduled.  °

## 2017-12-04 NOTE — Telephone Encounter (Signed)
Has not been seen in a month. If he's still sick- he needs to be seen.

## 2017-12-07 ENCOUNTER — Encounter: Payer: Self-pay | Admitting: Family Medicine

## 2017-12-07 ENCOUNTER — Ambulatory Visit (INDEPENDENT_AMBULATORY_CARE_PROVIDER_SITE_OTHER): Payer: Medicare Other | Admitting: Family Medicine

## 2017-12-07 VITALS — BP 104/67 | HR 65 | Temp 97.8°F | Wt 313.1 lb

## 2017-12-07 DIAGNOSIS — J441 Chronic obstructive pulmonary disease with (acute) exacerbation: Secondary | ICD-10-CM | POA: Diagnosis not present

## 2017-12-07 DIAGNOSIS — Z952 Presence of prosthetic heart valve: Secondary | ICD-10-CM

## 2017-12-07 LAB — COAGUCHEK XS/INR WAIVED
INR: 4.2 — ABNORMAL HIGH (ref 0.9–1.1)
PROTHROMBIN TIME: 49.9 s

## 2017-12-07 MED ORDER — ALBUTEROL SULFATE (2.5 MG/3ML) 0.083% IN NEBU: 2.5000 mg | INHALATION_SOLUTION | Freq: Four times a day (QID) | RESPIRATORY_TRACT | 1 refills | Status: DC | PRN

## 2017-12-07 NOTE — Assessment & Plan Note (Signed)
Significantly better with neb. Will continue nebs at home. Call with any concerns. Recheck 2 weeks.

## 2017-12-07 NOTE — Progress Notes (Signed)
BP 104/67 (BP Location: Left Arm, Patient Position: Sitting, Cuff Size: Large)   Pulse 65   Temp 97.8 F (36.6 C)   Wt (!) 313 lb 2 oz (142 kg)   SpO2 95%   BMI 50.54 kg/m    Subjective:    Patient ID: Roberto [name], male    DOB: Jul 31, 1955, 62 y.o.   MRN: [id number]  HPI: Roberto [name] is a 62 y.o. male  Chief Complaint  Patient presents with  . Anticoagulation   UPPER RESPIRATORY TRACT INFECTION Duration: Month- feels like he never got better with his other medicine Worst symptom: cough Fever: no Cough: yes Shortness of breath: yes Wheezing: no Chest pain: no Chest tightness: no Chest congestion: yes Nasal congestion: no Runny nose: no Post nasal drip: no Sneezing: no Sore throat: no Swollen glands: no Sinus pressure: no Headache: no Face pain: no Toothache: no Ear pain: no  Ear pressure: no  Eyes red/itching:no Eye drainage/crusting: no  Vomiting: no Rash: no Fatigue: yes Sick contacts: yes Strep contacts: no  Context: stable Recurrent sinusitis: no Relief with OTC cold/cough medications: no  Treatments attempted: antibiotics, prednisone   Coumadin Management.  The expected duration of coumadin treatment is lifelong The reason for anticoagulation is  mechanical heart valve.  Present Coumadin dose: 4, 4, 7.5 repeat Goal: 2.5-3.5  Excessive bruising: no Nose bleeding: no Rectal bleeding: no Prolonged menstrual cycles: N/A Eating diet with consistent amounts of foods containing Vitamin K:yes Any recent antibiotic use? yes   Relevant past medical, surgical, family and social history reviewed and updated as indicated. Interim medical history since our last visit reviewed. Allergies and medications reviewed and updated.  Review of Systems  Constitutional: Negative.   HENT: Negative for congestion, dental problem, drooling, ear discharge, ear pain, facial swelling, hearing loss, mouth sores, nosebleeds, postnasal drip, rhinorrhea, sinus  pressure, sinus pain, sneezing, sore throat, tinnitus, trouble swallowing and voice change.   Respiratory: Positive for cough, chest tightness, shortness of breath and wheezing. Negative for apnea, choking and stridor.   Cardiovascular: Negative.  Negative for chest pain, palpitations and leg swelling.  Psychiatric/Behavioral: Negative.     Per HPI unless specifically indicated above     Objective:    BP 104/67 (BP Location: Left Arm, Patient Position: Sitting, Cuff Size: Large)   Pulse 65   Temp 97.8 F (36.6 C)   Wt (!) 313 lb 2 oz (142 kg)   SpO2 95%   BMI 50.54 kg/m   Wt Readings from Last 3 Encounters:  12/07/17 (!) 313 lb 2 oz (142 kg)  11/09/17 (!) 312 lb 3.2 oz (141.6 kg)  10/26/17 (!) 314 lb 4 oz (142.5 kg)    Physical Exam  Constitutional: He is oriented to person, place, and time. He appears well-developed and well-nourished. No distress.  HENT:  Head: Normocephalic and atraumatic.  Right Ear: Hearing and external ear normal.  Left Ear: Hearing and external ear normal.  Nose: Nose normal.  Mouth/Throat: Oropharynx is clear and moist. No oropharyngeal exudate.  Eyes: Pupils are equal, round, and reactive to light. Conjunctivae, EOM and lids are normal. Right eye exhibits no discharge. Left eye exhibits no discharge. No scleral icterus.  Neck: Normal range of motion. Neck supple. No JVD present. No tracheal deviation present. No thyromegaly present.  Cardiovascular: Normal rate, regular rhythm, normal heart sounds and intact distal pulses. Exam reveals no gallop and no friction rub.  No murmur heard. Pulmonary/Chest: Effort normal. No stridor. No respiratory distress. He  has wheezes (RLL). He has no rales. He exhibits no tenderness.  Musculoskeletal: Normal range of motion.  Lymphadenopathy:    He has no cervical adenopathy.  Neurological: He is alert and oriented to person, place, and time.  Skin: Skin is warm, dry and intact. Capillary refill takes less than 2  seconds. No rash noted. He is not diaphoretic. No erythema. No pallor.  Psychiatric: He has a normal mood and affect. His speech is normal and behavior is normal. Judgment and thought content normal. Cognition and memory are normal.  Nursing note and vitals reviewed.   Results for orders placed or performed in visit on 11/09/17  CoaguChek XS/INR Waived  Result Value Ref Range   INR 2.8 (H) 0.9 - 1.1   Prothrombin Time 34.0 sec      Assessment & Plan:   Problem List Items Addressed This Visit      Respiratory   COPD (chronic obstructive pulmonary disease) ([place])    Significantly better with neb. Will continue nebs at home. Call with any concerns. Recheck 2 weeks.       Relevant Medications   albuterol (PROVENTIL) (2.5 MG/3ML) 0.083% nebulizer solution     Other   Mechanical heart valve present - Primary    Supratherapeutic. Will change dose to 4, 4, 6 and recheck 2 weeks. Call with any concerns.       Relevant Orders   CoaguChek XS/INR Waived       Follow up plan: Return in about 2 weeks (around 12/21/2017) for INR and lung recheck.

## 2017-12-07 NOTE — Assessment & Plan Note (Signed)
Supratherapeutic. Will change dose to 4, 4, 6 and recheck 2 weeks. Call with any concerns.

## 2017-12-21 ENCOUNTER — Ambulatory Visit: Payer: Medicare Other | Admitting: Family Medicine

## 2017-12-25 ENCOUNTER — Other Ambulatory Visit: Payer: Self-pay | Admitting: Family Medicine

## 2017-12-27 NOTE — Telephone Encounter (Signed)
LOV 12-07-2017 with Dr. Wynetta Emery / See note:  Disp Refills Start End   albuterol (PROVENTIL) (2.5 MG/3ML) 0.083% nebulizer solution 150 mL 1 12/07/2017    Sig - Route: Take 3 mLs (2.5 mg total) by nebulization every 6 (six) hours as needed for wheezing or shortness of breath. - Nebulization    /

## 2018-01-02 ENCOUNTER — Ambulatory Visit: Payer: Medicare Other | Admitting: Internal Medicine

## 2018-01-02 NOTE — Progress Notes (Deleted)
Follow-up Outpatient Visit Date: 01/02/2018  Primary Care Provider: Valerie Roys, DO [address] [name] [id number]  Chief Complaint: ***  HPI:  Mr. Maffei is a 62 y.o. year-old male with history of mechanical aortic valve replacement, stroke, right renal infarct, chronic systolic heart failure (LVEF 40-45%), and hyperlipidemia, who presents for follow-up of mechanical aortic valve and heart failure.  I last saw him a year ago, at which time he was doing well.  He was seen in February by Christell Faith, PA.  At the time, he was struggling with a cough that improved after resuming daily furosemide (as opposed to as needed).  --------------------------------------------------------------------------------------------------  Cardiovascular History & Procedures: Cardiovascular Problems:  Bicuspid aortic valve status post aortic valve replacement (9/47/65)  Chronic systolic heart failure  Stroke  Renal infarct  Risk Factors:  Hyperlipidemia, stroke  Cath/PCI:  LHC/RHC (03/21/10, [place], [place]): No significant CAD. LVEDP 31 mmHg. Mean AoV gradient 34 mmHg at rest and 47 mmHg with dobutamine 20 mcg/kg/min. AVA 1.0 cm^2. RA 31, RV 68/25, PA 68/47, PCWP 38. PA sat 65%. CO 6.2 L/min (Fick) and 5.3 L/min (thermodilution).  CV Surgery:  Mechanical aortic valve replacement and ascending aortoplasty (03/25/10,  [place], [place]): #27 Carbomedics mechanical valve.  EP Procedures and Devices:  None available  Non-Invasive Evaluation(s):  Transthoracic echocardiogram (06/05/16): Technically difficult study. Moderately dilated left ventricle with moderate LVH. Moderately reduced LV contraction (EF 40-45%). AVR well-seated with 14 mmHg gradient. Peak AV velocity 2.5 m/s. Mild mitral valve thickening with mild MR. Mildly dilated RV with mildly reduced contraction.  Transthoracic echocardiogram (05/13/14, [facility]): Mild LVH  with LVEF 35-40% and global hypokinesis. Mechanical aortic valve present with trace regurgitation. Normal LVOT gradient. Mild TR. Mild pulmonary hypertension. Normal RV size and function.  Recent CV Pertinent Labs: Lab Results  Component Value Date   CHOL 177 05/17/2017   CHOL 131 05/25/2014   HDL 38 (L) 10/24/2016   HDL 30 (L) 05/25/2014   LDLCALC 70 10/24/2016   LDLCALC 68 05/25/2014   TRIG 361 (H) 05/17/2017   TRIG 167 05/25/2014   INR 4.2 (H) 12/07/2017   INR 1.9 05/25/2014   K 4.1 09/28/2017   K 3.6 05/25/2014   BUN 19 09/28/2017   BUN 15 05/25/2014   CREATININE 0.94 09/28/2017   CREATININE 0.85 05/25/2014    Past medical and surgical history were reviewed and updated in EPIC.  No outpatient medications have been marked as taking for the 01/02/18 encounter (Appointment) with Omega Durante, Harrell Gave, MD.    Allergies: Patient has no known allergies.  Social History   Tobacco Use  . Smoking status: Current Every Day Smoker    Packs/day: 0.50    Years: 40.00    Pack years: 20.00    Types: Cigarettes    Start date: 07/16/2017  . Smokeless tobacco: Never Used  . Tobacco comment: 3 cigarettes/ day  Substance Use Topics  . Alcohol use: No    Alcohol/week: 0.0 oz  . Drug use: No    Family History  Problem Relation Age of Onset  . Arthritis Mother   . Cancer Mother        colon  . Arthritis Father   . Diabetes Father   . Stroke Father   . Cancer Father        colon  . Cancer Brother        brain  . Cancer Sister  breast  . Heart disease Neg Hx     Review of Systems: A 12-system review of systems was performed and was negative except as noted in the HPI.  --------------------------------------------------------------------------------------------------  Physical Exam: There were no vitals taken for this visit.  General:  *** HEENT: No conjunctival pallor or scleral icterus. Moist mucous membranes.  OP clear. Neck: Supple without lymphadenopathy,  thyromegaly, JVD, or HJR. No carotid bruit. Lungs: Normal work of breathing. Clear to auscultation bilaterally without wheezes or crackles. Heart: Regular rate and rhythm without murmurs, rubs, or gallops. Non-displaced PMI. Abd: Bowel sounds present. Soft, NT/ND without hepatosplenomegaly Ext: No lower extremity edema. Radial, PT, and DP pulses are 2+ bilaterally. Skin: Warm and dry without rash.  EKG:  ***  Lab Results  Component Value Date   WBC 8.2 07/27/2017   HGB 16.1 07/27/2017   HCT 48.9 07/27/2017   MCV 86.6 07/27/2017   PLT 245 07/27/2017    Lab Results  Component Value Date   NA 143 09/28/2017   K 4.1 09/28/2017   CL 104 09/28/2017   CO2 24 09/28/2017   BUN 19 09/28/2017   CREATININE 0.94 09/28/2017   GLUCOSE 129 (H) 09/28/2017   ALT 19 09/28/2017    Lab Results  Component Value Date   CHOL 177 05/17/2017   HDL 38 (L) 10/24/2016   LDLCALC 70 10/24/2016   TRIG 361 (H) 05/17/2017    --------------------------------------------------------------------------------------------------  ASSESSMENT AND PLAN: Harrell Gave Nycole Kawahara, MD 01/02/2018 7:47 AM

## 2018-01-04 ENCOUNTER — Encounter: Payer: Medicare Other | Admitting: Unknown Physician Specialty

## 2018-01-10 ENCOUNTER — Ambulatory Visit: Payer: Medicare Other | Admitting: Family Medicine

## 2018-01-15 ENCOUNTER — Other Ambulatory Visit: Payer: Self-pay

## 2018-01-15 ENCOUNTER — Encounter: Payer: Self-pay | Admitting: Unknown Physician Specialty

## 2018-01-15 ENCOUNTER — Ambulatory Visit: Payer: Medicare Other | Admitting: Unknown Physician Specialty

## 2018-01-15 VITALS — BP 114/74 | HR 76 | Temp 98.3°F | Ht 64.17 in | Wt 310.0 lb

## 2018-01-15 DIAGNOSIS — I428 Other cardiomyopathies: Secondary | ICD-10-CM

## 2018-01-15 DIAGNOSIS — J441 Chronic obstructive pulmonary disease with (acute) exacerbation: Secondary | ICD-10-CM

## 2018-01-15 NOTE — Assessment & Plan Note (Signed)
FVC was 73%.  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

## 2018-01-15 NOTE — Assessment & Plan Note (Signed)
Pt meets standards outlined by [facility] as qualified to drive.  Specifically, he has baseline exercise tolerance, no history of syncope, cardiac arrest, or sustained V tach and a EF of greater than 40%

## 2018-01-15 NOTE — Progress Notes (Signed)
   BP 114/74   Pulse 76   Temp 98.3 F (36.8 C) (Oral)   Ht 5' 4.17" (1.63 m)   Wt (!) 310 lb (140.6 kg)   SpO2 95%   BMI 52.92 kg/m    Subjective:    Patient ID: Roberto [name], male    DOB: August 29, 1955, 62 y.o.   MRN: [id number]  HPI: Roberto [name] is a 62 y.o. male  Chief Complaint  Patient presents with  . DOT physical   Pt is here for general history and PE.  Stress test done within the last 2 years.  Cardiomyopathy of 40-45%.  Exercise tolerance continues to be good and not diminished.   He continues to be compliant with visits to the cardiologist. S/p valve replacement on appropriate anti-coagulation.  Pt has no history of syncope, cardiac arrest, or V tach.    Relevant past medical, surgical, family and social history reviewed and updated as indicated. Interim medical history since our last visit reviewed. Allergies and medications reviewed and updated.  Review of Systems  Per HPI unless specifically indicated above     Objective:    BP 114/74   Pulse 76   Temp 98.3 F (36.8 C) (Oral)   Ht 5' 4.17" (1.63 m)   Wt (!) 310 lb (140.6 kg)   SpO2 95%   BMI 52.92 kg/m   Wt Readings from Last 3 Encounters:  01/15/18 (!) 310 lb (140.6 kg)  12/07/17 (!) 313 lb 2 oz (142 kg)  11/09/17 (!) 312 lb 3.2 oz (141.6 kg)    Physical Exam  Constitutional: He is oriented to person, place, and time. He appears well-developed and well-nourished. No distress.  HENT:  Head: Normocephalic and atraumatic.  Eyes: Conjunctivae and lids are normal. Right eye exhibits no discharge. Left eye exhibits no discharge. No scleral icterus.  Neck: Normal range of motion. Neck supple. No JVD present. Carotid bruit is not present.  Cardiovascular: Normal rate, regular rhythm and normal heart sounds.  Pulmonary/Chest: Effort normal and breath sounds normal. No respiratory distress.  Abdominal: Normal appearance. There is no splenomegaly or hepatomegaly.  Musculoskeletal: Normal range of  motion.  Neurological: He is alert and oriented to person, place, and time.  Skin: Skin is warm, dry and intact. No rash noted. No pallor.  Psychiatric: He has a normal mood and affect. His behavior is normal. Judgment and thought content normal.    Results for orders placed or performed in visit on 12/07/17  CoaguChek XS/INR Waived  Result Value Ref Range   INR 4.2 (H) 0.9 - 1.1   Prothrombin Time 49.9 sec      Assessment & Plan:   Problem List Items Addressed This Visit      Unprioritized   COPD (chronic obstructive pulmonary disease) ([place]) - Primary    FVC was 73%.  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        Relevant Orders   Spirometry with Graph (Completed)   NICM (nonischemic cardiomyopathy) ([place])    Pt meets standards outlined by [facility] as qualified to drive.  Specifically, he has baseline exercise tolerance, no history of syncope, cardiac arrest, or sustained V tach and a EF of greater than 40%          Qualifies for 1 year certificate  Follow up plan: Return in about 1 year (around 01/16/2019).

## 2018-01-16 ENCOUNTER — Ambulatory Visit (INDEPENDENT_AMBULATORY_CARE_PROVIDER_SITE_OTHER): Payer: Medicare Other | Admitting: Internal Medicine

## 2018-01-16 ENCOUNTER — Encounter: Payer: Self-pay | Admitting: Internal Medicine

## 2018-01-16 VITALS — BP 100/66 | HR 88 | Ht 65.0 in | Wt 309.0 lb

## 2018-01-16 DIAGNOSIS — I38 Endocarditis, valve unspecified: Secondary | ICD-10-CM | POA: Diagnosis not present

## 2018-01-16 DIAGNOSIS — E782 Mixed hyperlipidemia: Secondary | ICD-10-CM

## 2018-01-16 DIAGNOSIS — I5022 Chronic systolic (congestive) heart failure: Secondary | ICD-10-CM | POA: Diagnosis not present

## 2018-01-16 DIAGNOSIS — I10 Essential (primary) hypertension: Secondary | ICD-10-CM

## 2018-01-16 DIAGNOSIS — Z720 Tobacco use: Secondary | ICD-10-CM | POA: Diagnosis not present

## 2018-01-16 DIAGNOSIS — Z952 Presence of prosthetic heart valve: Secondary | ICD-10-CM

## 2018-01-16 MED ORDER — FUROSEMIDE 40 MG PO TABS: 40.0000 mg | ORAL_TABLET | Freq: Every day | ORAL | 2 refills | Status: DC

## 2018-01-16 MED ORDER — SIMVASTATIN 40 MG PO TABS: 40.0000 mg | ORAL_TABLET | Freq: Every day | ORAL | 2 refills | Status: DC

## 2018-01-16 MED ORDER — METOPROLOL SUCCINATE ER 100 MG PO TB24: ORAL_TABLET | ORAL | 2 refills | Status: DC

## 2018-01-16 NOTE — Progress Notes (Signed)
Follow-up Outpatient Visit Date: 01/16/2018  Primary Care Provider: Valerie Roys, DO [place] [address]  Chief Complaint: Follow-up heart failure and valvular heart disease  HPI:  Mr. Speros is a 62 y.o. year-old male with history of mechanical aortic valve replacement, stroke, right renal infarct, chronic systolic heart failure (LVEF 40-45%), and hyperlipidemia, who presents for follow-up of heart failure and valvular heart disease.  He was last seen in our office in February by Christell Faith, PA, at which time he reported feeling well after resumption of furosemide after an ED visit in January.  He was continued on furosemide 40 mg daily.  Today, Mr. Milles reports feeling well.  He notes occasional mild calf swelling when he is on his feet a lot.  However, he denies shortness of breath, orthopnea, PND, chest pain, and palpitations.  He recently began exercising on the treadmill again.  He is able to mow his lawn now without needing to stop; last year he had to take a break after mowing only a few rows.  Mr. Kon denies bleeding and focal neurologic changes.  His most recent INR with Dr. Wynetta Emery was noted to supratherapeutic last month.  Unfortunately, he was unable to keep his f/u appointment and has not had a recheck since 6/7.  Mr. Lamba notes that his smoking has increased to 1.5 packs per day.  He would like to stop smoking and is planning on trying hypnosis.  --------------------------------------------------------------------------------------------------  Cardiovascular History & Procedures: Cardiovascular Problems:  Bicuspid aortic valve status post aortic valve replacement (02/14/47)  Chronic systolic heart failure  Stroke  Renal infarct  Risk Factors:  Hyperlipidemia, stroke  Cath/PCI:  LHC/RHC (03/21/10, [place], [place]): No significant CAD. LVEDP 31 mmHg. Mean AoV gradient 34 mmHg at rest and 47 mmHg with dobutamine 20  mcg/kg/min. AVA 1.0 cm^2. RA 31, RV 68/25, PA 68/47, PCWP 38. PA sat 65%. CO 6.2 L/min (Fick) and 5.3 L/min (thermodilution).  CV Surgery:  Mechanical aortic valve replacement and ascending aortoplasty (03/25/10,  [place], [place]): #27 Carbomedics mechanical valve.  EP Procedures and Devices:  None available  Non-Invasive Evaluation(s):  Transthoracic echocardiogram (06/05/16): Technically difficult study. Moderately dilated left ventricle with moderate LVH. Moderately reduced LV contraction (EF 40-45%). AVR well-seated with 14 mmHg gradient. Peak AV velocity 2.5 m/s. Mild mitral valve thickening with mild MR. Mildly dilated RV with mildly reduced contraction.  Transthoracic echocardiogram (05/13/14, [facility]): Mild LVH with LVEF 35-40% and global hypokinesis. Mechanical aortic valve present with trace regurgitation. Normal LVOT gradient. Mild TR. Mild pulmonary hypertension. Normal RV size and function.   Recent CV Pertinent Labs: Lab Results  Component Value Date   CHOL 177 05/17/2017   CHOL 131 05/25/2014   HDL 38 (L) 10/24/2016   HDL 30 (L) 05/25/2014   LDLCALC 70 10/24/2016   LDLCALC 68 05/25/2014   TRIG 361 (H) 05/17/2017   TRIG 167 05/25/2014   INR 3.0 (H) 01/16/2018   INR 4.2 (H) 12/07/2017   INR 1.9 05/25/2014   K 3.8 01/16/2018   K 3.6 05/25/2014   BUN 16 01/16/2018   BUN 15 05/25/2014   CREATININE 0.83 01/16/2018   CREATININE 0.85 05/25/2014    Past medical and surgical history were reviewed and updated in EPIC.  Current Meds  Medication Sig  . albuterol (PROVENTIL HFA;VENTOLIN HFA) 108 (90 Base) MCG/ACT inhaler Inhale 2 puffs into the lungs every 6 (six) hours as needed for wheezing or  shortness of breath.  Marland Kitchen albuterol (PROVENTIL) (2.5 MG/3ML) 0.083% nebulizer solution Take 3 mLs (2.5 mg total) by nebulization every 6 (six) hours as needed for wheezing or shortness of breath.  . allopurinol (ZYLOPRIM) 100 MG tablet Take  1 tablet (100 mg total) by mouth daily.  Marland Kitchen aspirin 81 MG chewable tablet Chew 81 mg by mouth daily.    . cyclobenzaprine (FLEXERIL) 10 MG tablet TAKE 1 TABLET BY MOUTH TWICE DAILY AS NEEDED FOR MUSCLE SPASM  . Fluticasone-Salmeterol (ADVAIR) 250-50 MCG/DOSE AEPB Inhale 1 puff into the lungs 2 (two) times daily as needed.  . furosemide (LASIX) 40 MG tablet Take 1 tablet (40 mg total) by mouth daily.  Marland Kitchen lisinopril (PRINIVIL,ZESTRIL) 5 MG tablet Take 5 mg by mouth daily.  . metoprolol succinate (TOPROL-XL) 100 MG 24 hr tablet TAKE 1 TABLET BY MOUTH ONCE DAILY WITH  OR  IMMEDIATELY  FOLLOWING  A  MEAL  . simvastatin (ZOCOR) 40 MG tablet Take 1 tablet (40 mg total) by mouth at bedtime.  Marland Kitchen tiotropium (SPIRIVA) 18 MCG inhalation capsule Place 18 mcg into inhaler and inhale daily as needed.  . warfarin (COUMADIN) 4 MG tablet Take as directed  . warfarin (COUMADIN) 7.5 MG tablet Take as directed  . [DISCONTINUED] furosemide (LASIX) 40 MG tablet TAKE 1 TABLET BY MOUTH ONCE DAILY  . [DISCONTINUED] metoprolol succinate (TOPROL-XL) 100 MG 24 hr tablet TAKE 1 TABLET BY MOUTH ONCE DAILY WITH  OR  IMMEDIATELY  FOLLOWING  A  MEAL  . [DISCONTINUED] simvastatin (ZOCOR) 40 MG tablet TAKE 1 TABLET BY MOUTH AT BEDTIME    Allergies: Patient has no known allergies.  Social History   Tobacco Use  . Smoking status: Current Every Day Smoker    Packs/day: 1.50    Years: 40.00    Pack years: 60.00    Types: Cigarettes    Start date: 07/16/2017  . Smokeless tobacco: Never Used  . Tobacco comment: 3 cigarettes/ day  Substance Use Topics  . Alcohol use: No    Alcohol/week: 0.0 oz  . Drug use: No    Family History  Problem Relation Age of Onset  . Arthritis Mother   . Cancer Mother        colon  . Arthritis Father   . Diabetes Father   . Stroke Father   . Cancer Father        colon  . Cancer Brother        brain  . Cancer Sister        breast  . Heart disease Neg Hx     Review of Systems: A  12-system review of systems was performed and was negative except as noted in the HPI.  --------------------------------------------------------------------------------------------------  Physical Exam: BP 100/66 (BP Location: Left Arm, Patient Position: Sitting, Cuff Size: Normal)   Pulse 88   Ht 5\' 5"  (1.651 m)   Wt (!) 309 lb (140.2 kg)   BMI 51.42 kg/m   General:  Morbidly obese man, seated comfortably on the exam table. HEENT: No conjunctival pallor or scleral icterus. Moist mucous membranes.  OP clear. Neck: Supple without lymphadenopathy, thyromegaly, JVD, or HJR, though evaluation is limited by body habitus. Lungs: Normal work of breathing. Clear to auscultation bilaterally without wheezes or crackles. Heart: Distant heart sounds.  Regular rate and rhythm with mechanical S2 and 2/6 systolic murmur loudest at the RUSB. Unable to assess PMI due to body habitus. Abd: Bowel sounds present. Soft, NT/ND.  Unable to assess HSM  due to body habitus. Ext: Trace pretibial edema bilaterally. Skin: Warm and dry without rash.  EKG:  Normal sinus rhythm with polymorphic PVC's and lateral T-wave inversions.  Prolonged QT.  No significant change from prior tracing on 08/06/17.  Lab Results  Component Value Date   WBC 8.2 07/27/2017   HGB 16.1 07/27/2017   HCT 48.9 07/27/2017   MCV 86.6 07/27/2017   PLT 245 07/27/2017    Lab Results  Component Value Date   NA 144 01/16/2018   K 3.8 01/16/2018   CL 107 (H) 01/16/2018   CO2 19 (L) 01/16/2018   BUN 16 01/16/2018   CREATININE 0.83 01/16/2018   GLUCOSE 149 (H) 01/16/2018   ALT 19 09/28/2017    Lab Results  Component Value Date   CHOL 177 05/17/2017   HDL 38 (L) 10/24/2016   LDLCALC 70 10/24/2016   TRIG 361 (H) 05/17/2017    --------------------------------------------------------------------------------------------------  ASSESSMENT AND PLAN: Chronic systolic heart failure Volume exam is challenging due to body habitus, though  Mr. Adelsberger appears grossly euvolemic with NYHA class II symptoms.  We will continue current doses of metoprolol, lisinopril, spironolactone, and furosemide, as soft blood pressure precludes uptitration today.  I will check a BMP today in the setting of long-term spironolactone and lisinopril use.  Valvular heart disease status post mechanical AVR No signs or symptoms to suggest valve dysfunction.  Most recent INR last month was supratherapeutic.  We will recheck an INR today to ensure that it is back in range.  I have encouraged Mr. Morandi to contact Dr. Durenda Age office to arrange for ongoing management of warfarin.  He should remain on ASA 81 mg daily.  I encouraged him to see a dentist to ensure that he does not develop any dental infections that could seed his valve.  Antibiotic prophylaxis for dental procedures was recommended.  Essential hypertension Blood pressure low-normal today.  No medication changes at this time.  Hyperlipidemia Continue simvastatin.  We will check a fasting lipid panel at the patient's convenience.  Morbid obesity Weight loss through diet and exercise encouraged.  Tobacco abuse Cessation encouraged.  Mr. Range is planning to try hypnosis.  Follow-up: Return to clinic in 3 months.  Nelva Bush, MD 01/17/2018 8:07 PM

## 2018-01-16 NOTE — Patient Instructions (Addendum)
Medication Instructions:  Your physician recommends that you continue on your current medications as directed. Please refer to the Current Medication list given to you today.   Labwork: Your physician recommends that you return for lab work in: TODAY (BMET, INR).  Your physician recommends that you return for lab work in: [place] 2-3 [place]. (LIPID) - You will need to be FASTING. - Please go to the [facility]. You will check in at the front desk to the right as you walk into the atrium. Valet Parking is offered if needed.    Testing/Procedures: none  Follow-Up: Your physician recommends that you schedule a follow-up appointment in: 3 MONTHS WITH DR END.   If you need a refill on your cardiac medications before your next appointment, please call your pharmacy.

## 2018-01-17 ENCOUNTER — Telehealth: Payer: Self-pay | Admitting: Family Medicine

## 2018-01-17 DIAGNOSIS — I38 Endocarditis, valve unspecified: Secondary | ICD-10-CM | POA: Insufficient documentation

## 2018-01-17 LAB — BASIC METABOLIC PANEL
BUN/Creatinine Ratio: 19 (ref 10–24)
BUN: 16 mg/dL (ref 8–27)
CHLORIDE: 107 mmol/L — AB (ref 96–106)
CO2: 19 mmol/L — AB (ref 20–29)
CREATININE: 0.83 mg/dL (ref 0.76–1.27)
Calcium: 9.5 mg/dL (ref 8.6–10.2)
GFR calc Af Amer: 108 mL/min/{1.73_m2} (ref >59)
GFR, EST NON AFRICAN AMERICAN: 94 mL/min/{1.73_m2} (ref >59)
Glucose: 149 mg/dL — ABNORMAL HIGH (ref 65–99)
Potassium: 3.8 mmol/L (ref 3.5–5.2)
SODIUM: 144 mmol/L (ref 134–144)

## 2018-01-17 LAB — PROTIME-INR
INR: 3 — AB (ref 0.8–1.2)
PROTHROMBIN TIME: 28.9 s — AB (ref 9.1–12.0)

## 2018-01-17 NOTE — Telephone Encounter (Signed)
Copied from [place] [phone]. Topic: General - Other >> Jan 17, 2018 10:47 AM Roberto [name] wrote: Pts wife is calling in wanting to know how far out to make an appt for INR due to having coumadin checked on yesterday at the heart doc his levels were a 3   CB# [phone]

## 2018-01-17 NOTE — Telephone Encounter (Signed)
1 month

## 2018-01-17 NOTE — Telephone Encounter (Signed)
Patient notified that he needs to be seen in 1 month.

## 2018-01-17 NOTE — Telephone Encounter (Signed)
Tried to call patient, no answer, will try again. 

## 2018-01-25 ENCOUNTER — Other Ambulatory Visit: Payer: Self-pay | Admitting: Family Medicine

## 2018-01-25 ENCOUNTER — Telehealth: Payer: Self-pay | Admitting: Family Medicine

## 2018-01-25 NOTE — Telephone Encounter (Signed)
Copied from [place] [id number]. Topic: Quick Communication - Rx Refill/Question >> Jan 25, 2018  4:16 PM Bea Graff, NT wrote: Medication: albuterol (PROVENTIL) (2.5 MG/3ML) 0.083% nebulizer solution need diagnosis code on medication order  Has the patient contacted their pharmacy? Yes.   (Agent: If no, request that the patient contact the pharmacy for the refill.) (Agent: If yes, when and what did the pharmacy advise?)  Preferred Pharmacy (with phone number or street name): [facility] (N), [place] [phone] (Phone) [phone] (Fax)      Agent: Please be advised that RX refills may take up to 3 business days. We ask that you follow-up with your pharmacy.

## 2018-01-28 MED ORDER — ALBUTEROL SULFATE (2.5 MG/3ML) 0.083% IN NEBU: 2.5000 mg | INHALATION_SOLUTION | Freq: Four times a day (QID) | RESPIRATORY_TRACT | 1 refills | Status: DC | PRN

## 2018-02-01 NOTE — Progress Notes (Signed)
Needs DM Visit

## 2018-02-22 ENCOUNTER — Ambulatory Visit: Payer: Medicare Other | Admitting: Internal Medicine

## 2018-02-22 ENCOUNTER — Ambulatory Visit (INDEPENDENT_AMBULATORY_CARE_PROVIDER_SITE_OTHER): Payer: Medicare Other | Admitting: Family Medicine

## 2018-02-22 DIAGNOSIS — Z952 Presence of prosthetic heart valve: Secondary | ICD-10-CM

## 2018-03-06 ENCOUNTER — Encounter: Payer: Self-pay | Admitting: Physician Assistant

## 2018-03-06 ENCOUNTER — Ambulatory Visit (INDEPENDENT_AMBULATORY_CARE_PROVIDER_SITE_OTHER): Payer: Medicare Other | Admitting: Physician Assistant

## 2018-03-06 VITALS — BP 100/68 | HR 77 | Temp 98.4°F | Ht 65.0 in | Wt 311.0 lb

## 2018-03-06 DIAGNOSIS — Z952 Presence of prosthetic heart valve: Secondary | ICD-10-CM | POA: Diagnosis not present

## 2018-03-06 DIAGNOSIS — Z7901 Long term (current) use of anticoagulants: Secondary | ICD-10-CM | POA: Diagnosis not present

## 2018-03-06 LAB — COAGUCHEK XS/INR WAIVED
INR: 2.1 — ABNORMAL HIGH (ref 0.9–1.1)
Prothrombin Time: 25.1 s

## 2018-03-06 NOTE — Progress Notes (Signed)
   Subjective:    Patient ID: Roberto [name], male    DOB: 12-Dec-1955, 62 y.o.   MRN: [id number]  Roberto [name] is a 62 y.o. male presenting on 03/06/2018 for Coagulation Disorder (Pt taking 7.5 mg, 4 mg, 4mg  of warfarin ) and Rash (left forearm)   HPI   Has mechanical aortic valve, replaced in [place]. Currently taking above dose of warfarin. Reports he is compliant with this dosage. INR today is 2.1, down from 3 last visit. Reports he has gone on a pickle eating binge and will eat whole jars in one sitting. Denies chest pain, SOB, focal neurological deficit.   Social History   Tobacco Use  . Smoking status: Current Every Day Smoker    Packs/day: 1.50    Years: 40.00    Pack years: 60.00    Types: Cigarettes    Start date: 07/16/2017  . Smokeless tobacco: Never Used  . Tobacco comment: 3 cigarettes/ day  Substance Use Topics  . Alcohol use: No    Alcohol/week: 0.0 standard drinks  . Drug use: No    Review of Systems Per HPI unless specifically indicated above     Objective:    BP 100/68   Pulse 77   Temp 98.4 F (36.9 C) (Oral)   Ht 5\' 5"  (1.651 m)   Wt (!) 311 lb (141.1 kg)   SpO2 96%   BMI 51.75 kg/m   Wt Readings from Last 3 Encounters:  03/06/18 (!) 311 lb (141.1 kg)  01/16/18 (!) 309 lb (140.2 kg)  01/15/18 (!) 310 lb (140.6 kg)    Physical Exam  Constitutional: He is oriented to person, place, and time. He appears well-developed and well-nourished.  Cardiovascular: Normal rate and regular rhythm.  Mechanical heart valve clicking present.   Pulmonary/Chest: Effort normal and breath sounds normal.  Neurological: He is alert and oriented to person, place, and time.  Skin: Skin is warm and dry.  Psychiatric: He has a normal mood and affect. His behavior is normal.   Results for orders placed or performed in visit on 03/06/18  CoaguChek XS/INR Waived  Result Value Ref Range   INR 2.1 (H) 0.9 - 1.1   Prothrombin Time 25.1 sec      Assessment & Plan:  1.  Aortic valve replaced  INR 2.1 today, this is technically therapeutic though decreased from last visit. Patient does not wish to change dose. Should stop eating pickles. Follow up one month.  - CoaguChek XS/INR Waived  2. Chronic anticoagulation  Keep same dose of warfarin, 7.5 mg QD, then 4 mg Qd, then 4mg  QD, repeat.    Follow up plan: Return in about 1 year (around 03/07/2019) for inr check aortic valve .  Carles Collet, [name] [facility] 03/06/2018, 12:38 PM

## 2018-03-06 NOTE — Patient Instructions (Signed)
Warfarin Coagulopathy Warfarin (Coumadin) coagulopathy refers to bleeding that may occur as a complication of the medicine warfarin. Warfarin is an oral blood thinner (anticoagulant). Warfarin is used for medical conditions where thinning of the blood is needed to prevent blood clots. What are the causes? Bleeding is the most common and most serious complication of warfarin. The amount of bleeding is related to the warfarin dose and length of treatment. In addition, bleeding complications can also occur due to:  Intentional or accidental warfarin overdose.  Underlying medical conditions.  Dietary changes.  Medicine, herbal, supplement, or alcohol interactions.  What are the signs or symptoms? Severe bleeding while on warfarin may occur from any tissue or organ. Symptoms of the blood being too thin may include:  Bleeding from the nose or gums.  Blood in bowel movements which may appear as bright red, dark, or black tarry stools.  Blood in the urine which may appear as pink, red, or brown urine.  Unusual bruising or bruising easily.  A cut that does not stop bleeding within 10 minutes.  Vomiting blood or continuous nausea for more than 1 day.  Coughing up blood.  Broken blood vessels in your eye (subconjunctival hemorrhage).  Abdominal or back pain with or without flank bruising.  Sudden, severe headache.  Sudden weakness or numbness of the face, arm, or leg, especially on one side of the body.  Sudden confusion.  Trouble speaking (aphasia) or understanding.  Sudden trouble seeing in one or both eyes.  Sudden trouble walking.  Dizziness.  Loss of balance or coordination.  Vaginal bleeding.  Swelling or pain at an injection site.  Superficial fat tissue death (necrosis) which may cause skin scarring. This is more common in women and may first present as pain in the waist, thighs, or buttocks.  Follow these instructions at home:  Always contact your health care  provider of any concerns or signs of possible warfarin coagulopathy as soon as possible.  Take warfarin exactly as directed by your health care provider. It is recommended that you take your warfarin dose at the same time of the day. If you have been told to stop taking warfarin, do not resume taking warfarin until directed to do so by your health care provider. Follow your health care provider's instructions if you accidentally take an extra dose or miss a dose of warfarin. It is very important to take warfarin as directed since bleeding or blood clots could result in chronic or permanent injury, pain, or disability.  Keep all follow-up appointments with your health care provider as directed. It is very important to keep your appointments. Not keeping appointments could result in a chronic or permanent injury, pain, or disability because warfarin is a medicine that requires close monitoring.  While taking warfarin, you will need to have regular blood tests to measure your blood clotting time. These blood tests usually include both the prothrombin time (PT) and International Normalized Ratio (INR) tests. The PT and INR results allow your health care provider to adjust your dose of warfarin. The dose can change for many reasons. It is critically important that you have your PT and INR levels drawn exactly as directed. Your warfarin dose may stay the same or change depending on what the PT and INR results are. Be sure to follow up with your health care provider regarding your PT and INR test results and what your warfarin dosage should be.  Many medicines can interfere with warfarin and affect the PT and INR  results. You must tell your health care provider about any and all medicines you take. This includes all vitamins and supplements. Ask your health care provider before taking these. Prescription and over-the-counter medicine consistency is critical to warfarin management. It is important that potential  interactions are checked before you start a new medicine. Be especially cautious with aspirin and anti-inflammatory medicines. Ask your health care provider before taking these. Medicines such as antibiotics and acid-reducing medicine can interact with warfarin and can cause an increased warfarin effect. Warfarin can also interfere with the effectiveness of medicines you are taking. Do not take or discontinue any prescribed or over-the-counter medicine except on the advice of your health care provider or pharmacist.  Some vitamins, supplements, and herbal products interfere with the effectiveness of warfarin. Vitamin E may increase the anticoagulant effects of warfarin. Vitamin K can cause warfarin to be less effective. Do not take or discontinue any vitamin, supplement, or herbal product except on the advice of your health care provider or pharmacist.  Eat what you normally eat and keep the vitamin K content of your diet consistent. Avoid major changes in your diet, or notify your health care provider before changing your diet. Suddenly getting a lot more vitamin K could cause your blood to clot too quickly. A sudden decrease in vitamin K intake could cause your blood to clot too slowly. These changes in vitamin K intake could lead to dangerous blood clotsor to bleeding. To keep your vitamin K intake consistent, you must be aware of which foods contain moderate or high amounts of vitamin K. Some foods that are high in vitamin K include spinach, kale, broccoli, cabbage, greens, Brussels sprouts, asparagus, bok choy, coleslaw, and parsley. If you drink green tea, drink the same amount each day. Arrange a visit with a dietitian to answer your questions.  If you have a loss of appetite or get the stomach flu (viral gastroenteritis), talk to your health care provider as soon as possible. A decrease in your normal vitamin K intake can make you more sensitive to your usual dose of warfarin.  Some medical  conditions may increase your risk for bleeding while you are taking warfarin. A fever, diarrhea lasting more than a day, worsening heart failure, or worsening liver function are some medical conditions that could affect warfarin. Contact your health care provider if you have any of these medical conditions.  Be careful not to cut yourself when using sharp objects or while shaving.  Alcohol can change the body's ability to handle warfarin. It is best to avoid alcoholic drinks or consume only very small amounts while taking warfarin. Notify your health care provider if you change your alcohol intake. A sudden increase in alcohol use can increase your risk of bleeding. Chronic alcohol use can cause warfarin to be less effective.  Limit physical activities or sports that could result in a fall or cause injury.  Do not use warfarin if you are pregnant.  Inform all your health care providers and your dentist that you take warfarin.  Inform all health care providers if you are taking warfarin and aspirin or platelet inhibitor medicines such as clopidogrel, ticagrelor, or prasugrel. Use of these medicines in addition to warfarin can increase your risk of bleeding or death. Taking these medicines together should only be done under the direct care of your health care providers. Get help right away if:  You cough up blood.  You have dark or black stools or there is bright red  blood coming from your rectum.  You vomit blood or have nausea for more than 1 day.  You have blood in the urine or pink-colored urine.  You have unusual bruising or have increased bruising.  You have bleeding from the nose or gums that does not stop quickly.  You have a cut that does not stop bleeding within 2-3 minutes.  You have sudden weakness or numbness of the face, arm, or leg, especially on one side of the body.  You have sudden confusion.  You have trouble speaking (aphasia) or understanding.  You have sudden  trouble seeing in one or both eyes.  You have sudden trouble walking.  You have dizziness.  You have a loss of balance or coordination.  You have a sudden, severe headache.  You have a serious fall or head injury, even if you are not bleeding.  You have swelling or pain at an injection site.  You have unexplained tenderness or pain in the abdomen, back, waist, thighs, or buttocks. Any of these symptoms may represent a serious problem that is an emergency. Do not wait to see if the symptoms will go away. Get medical help right away. Call your local emergency services (911 in [place] Do not drive yourself to the hospital. This information is not intended to replace advice given to you by your health care provider. Make sure you discuss any questions you have with your health care provider. Document Released: 05/28/2006 Document Revised: 11/25/2015 Document Reviewed: 11/28/2011 Elsevier Interactive Patient Education  [employer].

## 2018-03-15 ENCOUNTER — Other Ambulatory Visit
Admission: RE | Admit: 2018-03-15 | Discharge: 2018-03-15 | Disposition: A | Discharge status: Home / Self Care | Payer: Medicare Other | Source: Ambulatory Visit | Attending: Internal Medicine | Admitting: Internal Medicine

## 2018-03-15 DIAGNOSIS — E782 Mixed hyperlipidemia: Secondary | ICD-10-CM | POA: Diagnosis not present

## 2018-03-15 LAB — LIPID PANEL
Cholesterol: 225 mg/dL — ABNORMAL HIGH (ref 0–200)
HDL: 46 mg/dL (ref >40)
LDL CALC: 116 mg/dL — AB (ref 0–99)
Total CHOL/HDL Ratio: 4.9 RATIO
Triglycerides: 314 mg/dL — ABNORMAL HIGH (ref <150)
VLDL: 63 mg/dL — ABNORMAL HIGH (ref 0–40)

## 2018-03-26 ENCOUNTER — Ambulatory Visit: Payer: Medicare Other | Admitting: Family Medicine

## 2018-03-29 DIAGNOSIS — M7751 Other enthesopathy of right foot: Secondary | ICD-10-CM | POA: Diagnosis not present

## 2018-03-29 DIAGNOSIS — M79671 Pain in right foot: Secondary | ICD-10-CM | POA: Diagnosis not present

## 2018-03-29 DIAGNOSIS — M19071 Primary osteoarthritis, right ankle and foot: Secondary | ICD-10-CM | POA: Diagnosis not present

## 2018-04-03 ENCOUNTER — Encounter: Payer: Self-pay | Admitting: Family Medicine

## 2018-04-03 ENCOUNTER — Ambulatory Visit (INDEPENDENT_AMBULATORY_CARE_PROVIDER_SITE_OTHER): Payer: Medicare Other | Admitting: Family Medicine

## 2018-04-03 VITALS — BP 103/68 | HR 69 | Wt 311.0 lb

## 2018-04-03 DIAGNOSIS — Z952 Presence of prosthetic heart valve: Secondary | ICD-10-CM

## 2018-04-03 DIAGNOSIS — Z23 Encounter for immunization: Secondary | ICD-10-CM | POA: Diagnosis not present

## 2018-04-03 DIAGNOSIS — R252 Cramp and spasm: Secondary | ICD-10-CM | POA: Diagnosis not present

## 2018-04-03 DIAGNOSIS — E119 Type 2 diabetes mellitus without complications: Secondary | ICD-10-CM

## 2018-04-03 DIAGNOSIS — I428 Other cardiomyopathies: Secondary | ICD-10-CM

## 2018-04-03 DIAGNOSIS — I700 Atherosclerosis of aorta: Secondary | ICD-10-CM | POA: Diagnosis not present

## 2018-04-03 DIAGNOSIS — M1A9XX0 Chronic gout, unspecified, without tophus (tophi): Secondary | ICD-10-CM | POA: Diagnosis not present

## 2018-04-03 DIAGNOSIS — E782 Mixed hyperlipidemia: Secondary | ICD-10-CM | POA: Diagnosis not present

## 2018-04-03 DIAGNOSIS — M722 Plantar fascial fibromatosis: Secondary | ICD-10-CM

## 2018-04-03 DIAGNOSIS — J441 Chronic obstructive pulmonary disease with (acute) exacerbation: Secondary | ICD-10-CM | POA: Diagnosis not present

## 2018-04-03 DIAGNOSIS — Z7901 Long term (current) use of anticoagulants: Secondary | ICD-10-CM | POA: Diagnosis not present

## 2018-04-03 DIAGNOSIS — I10 Essential (primary) hypertension: Secondary | ICD-10-CM

## 2018-04-03 DIAGNOSIS — I5022 Chronic systolic (congestive) heart failure: Secondary | ICD-10-CM

## 2018-04-03 LAB — COAGUCHEK XS/INR WAIVED
INR: 3.3 — AB (ref 0.9–1.1)
PROTHROMBIN TIME: 40.2 s

## 2018-04-03 LAB — BAYER DCA HB A1C WAIVED: HB A1C: 5.9 % (ref <7.0)

## 2018-04-03 MED ORDER — CYCLOBENZAPRINE HCL 10 MG PO TABS: ORAL_TABLET | ORAL | 1 refills | Status: DC

## 2018-04-03 NOTE — Assessment & Plan Note (Signed)
Rechecking A1c today. Await results. Will treat as needed. Call with any concerns.

## 2018-04-03 NOTE — Assessment & Plan Note (Signed)
Stable. Refills just given by cardiology. Continue to monitor.

## 2018-04-03 NOTE — Assessment & Plan Note (Signed)
Will keep BP, sugars and cholesterol under good control. Continue to follow with cardiology. Call with any concerns.  

## 2018-04-03 NOTE — Assessment & Plan Note (Signed)
Not under great control. Following with cardiology. Continue statin. Continue to monitor.

## 2018-04-03 NOTE — Progress Notes (Signed)
   There were no vitals taken for this visit.   Subjective:    Patient ID: Roberto [name], male    DOB: [birth year], 62 y.o.   MRN: [id number]  HPI: Roberto [name] is a 62 y.o. male  No chief complaint on file.   Relevant past medical, surgical, family and social history reviewed and updated as indicated. Interim medical history since our last visit reviewed. Allergies and medications reviewed and updated.  Review of Systems  Per HPI unless specifically indicated above     Objective:    There were no vitals taken for this visit.  Wt Readings from Last 3 Encounters:  03/06/18 (!) 311 lb (141.1 kg)  01/16/18 (!) 309 lb (140.2 kg)  01/15/18 (!) 310 lb (140.6 kg)    Physical Exam  Results for orders placed or performed during the hospital encounter of 03/15/18  Lipid panel  Result Value Ref Range   Cholesterol 225 (H) 0 - 200 mg/dL   Triglycerides 314 (H) <150 mg/dL   HDL 46 >40 mg/dL   Total CHOL/HDL Ratio 4.9 RATIO   VLDL 63 (H) 0 - 40 mg/dL   LDL Cholesterol 116 (H) 0 - 99 mg/dL      Assessment & Plan:   Problem List Items Addressed This Visit    None    Visit Diagnoses    Aortic valve replaced    -  Primary   Relevant Orders   CoaguChek XS/INR Waived   Chronic anticoagulation       Relevant Orders   CoaguChek XS/INR Waived       Follow up plan: No follow-ups on file.

## 2018-04-03 NOTE — Assessment & Plan Note (Signed)
INR 3.3- continue current regimen. Continue to monitor. Call with any concerns. Recheck 1 month.

## 2018-04-03 NOTE — Progress Notes (Signed)
BP 103/68   Pulse 69   Wt (!) 311 lb (141.1 kg)   SpO2 95%   BMI 51.75 kg/m    Subjective:    Patient ID: Roberto [name], male    DOB: Sep 03, 1955, 62 y.o.   MRN: [id number]  HPI: Roberto [name] is a 62 y.o. male  Chief Complaint  Patient presents with  . PT check  . Review imaging   Roberto [name] notes that he has been having a lot of spasm in his hands. The come out of the blue and make him drop whatever he is holding. He notes that he cannot bend his fingers when it happens. He has also noticed a slight tremor. No other concerns.   This patient is a 62 y.o. male who presents for coumadin management. The expected duration of coumadin treatment is lifelong The reason for anticoagulation is  mechanical heart valve.  Present Coumadin dose: 7.5, 4, 4, repeat Goal: 2.5-3.5  Excessive bruising: no Nose bleeding: no Rectal bleeding: no Prolonged menstrual cycles: N/A Eating diet with consistent amounts of foods containing Vitamin K:yes Any recent antibiotic use? no  DIABETES Hypoglycemic episodes:no Polydipsia/polyuria: no Visual disturbance: no Chest pain: no Paresthesias: no Glucose Monitoring: no Taking Insulin?: no Blood Pressure Monitoring: not checking Retinal Examination: Not up to Date Foot Exam: Done through podiatry Diabetic Education: Completed Pneumovax: Up to Date Influenza: Given today Aspirin: no  GOUT Duration: chronic Right 1st metatarsophalangeal pain: no Left 1st metatarsophalangeal pain: no Right knee pain: no Left knee pain: no Severity: no pain  Swelling: no Redness: no Trauma: no Recent dietary change or indiscretion: no Fevers: no Nausea/vomiting: no Aggravating factors: Alleviating factors:  Status:  stable Treatments attempted:  COPD COPD status: controlled Satisfied with current treatment?: yes Oxygen use: no Dyspnea frequency: occasionally Cough frequency: occasionally Rescue inhaler frequency:  occasionally Limitation of  activity: no Pneumovax: Up to Date Influenza: Up to Date   Relevant past medical, surgical, family and social history reviewed and updated as indicated. Interim medical history since our last visit reviewed. Allergies and medications reviewed and updated.  Review of Systems  Constitutional: Negative.   HENT: Negative.   Respiratory: Negative.   Cardiovascular: Negative.   Musculoskeletal: Negative.   Psychiatric/Behavioral: Negative.     Per HPI unless specifically indicated above     Objective:    BP 103/68   Pulse 69   Wt (!) 311 lb (141.1 kg)   SpO2 95%   BMI 51.75 kg/m   Wt Readings from Last 3 Encounters:  04/03/18 (!) 311 lb (141.1 kg)  03/06/18 (!) 311 lb (141.1 kg)  01/16/18 (!) 309 lb (140.2 kg)    Physical Exam  Constitutional: He is oriented to person, place, and time. He appears well-developed and well-nourished. No distress.  HENT:  Head: Normocephalic and atraumatic.  Right Ear: Hearing normal.  Left Ear: Hearing normal.  Nose: Nose normal.  Eyes: Conjunctivae and lids are normal. Right eye exhibits no discharge. Left eye exhibits no discharge. No scleral icterus.  Cardiovascular: Normal rate, regular rhythm and intact distal pulses. Exam reveals no gallop and no friction rub.  No murmur heard. +click  Pulmonary/Chest: Effort normal and breath sounds normal. No stridor. No respiratory distress. He has no wheezes. He has no rales. He exhibits no tenderness.  Musculoskeletal: Normal range of motion.  Neurological: He is alert and oriented to person, place, and time.  Skin: Skin is warm, dry and intact. Capillary refill takes less than 2 seconds. No  rash noted. He is not diaphoretic. No erythema. No pallor.  Psychiatric: He has a normal mood and affect. His speech is normal and behavior is normal. Judgment and thought content normal. Cognition and memory are normal.  Nursing note and vitals reviewed.   Results for orders placed or performed during the  hospital encounter of 03/15/18  Lipid panel  Result Value Ref Range   Cholesterol 225 (H) 0 - 200 mg/dL   Triglycerides 314 (H) <150 mg/dL   HDL 46 >40 mg/dL   Total CHOL/HDL Ratio 4.9 RATIO   VLDL 63 (H) 0 - 40 mg/dL   LDL Cholesterol 116 (H) 0 - 99 mg/dL      Assessment & Plan:   Problem List Items Addressed This Visit      Cardiovascular and Mediastinum   Essential hypertension    Stable. Refills just given by cardiology. Continue to monitor.       NICM (nonischemic cardiomyopathy) ([place])    Will keep BP, sugars and cholesterol under good control. Continue to follow with cardiology. Call with any concerns.       Aortic atherosclerosis ([facility])    Will keep BP, sugars and cholesterol under good control. Continue to follow with cardiology. Call with any concerns.       Chronic systolic heart failure ([facility])    Will keep BP, sugars and cholesterol under good control. Continue to follow with cardiology. Call with any concerns.         Respiratory   COPD (chronic obstructive pulmonary disease) ([facility])    Under good control on current regimen. Continue current regimen. Continue to monitor. Call with any concerns. Refills given.          Endocrine   Diabetes mellitus type 2, diet-controlled ([place])    Rechecking A1c today. Await results. Will treat as needed. Call with any concerns.       Relevant Orders   Bayer DCA Hb A1c Waived     Other   Mechanical heart valve present    INR 3.3- continue current regimen. Continue to monitor. Call with any concerns. Recheck 1 month.       Mixed hyperlipidemia    Not under great control. Following with cardiology. Continue statin. Continue to monitor.       Chronic gout without tophus   Relevant Orders   Uric acid    Other Visit Diagnoses    Aortic valve replaced    -  Primary   INR 3.3- continue current regimen. Continue to monitor. Call with any concerns. Recheck 1 month.    Relevant Orders   CoaguChek XS/INR Waived   Chronic  anticoagulation       INR 3.3- continue current regimen. Continue to monitor. Call with any concerns. Recheck 1 month.    Relevant Orders   CoaguChek XS/INR Waived   Hand or foot spasms       Of unclear etiology. Will check labs, if normal, will get him into see neurology for evaluation of spasms.    Relevant Orders   Comprehensive metabolic panel   TSH   CBC with Differential/Platelet   Plantar fasciitis       Follow up with podiatry. Call with any concerns.    Needs flu shot       Relevant Orders   Flu Vaccine QUAD 6+ mos PF IM (Fluarix Quad PF)       Follow up plan: Return in about 4 weeks (around 05/01/2018) for INR.

## 2018-04-03 NOTE — Patient Instructions (Signed)

## 2018-04-03 NOTE — Progress Notes (Signed)
   BP 103/68   Pulse 69   Wt (!) 311 lb (141.1 kg)   SpO2 95%   BMI 51.75 kg/m    Subjective:    Patient ID: Roberto [name], male    DOB: 12-29-1955, 62 y.o.   MRN: [id number]  CC: Coumadin management  HPI: This patient is a 62 y.o. male who presents for coumadin management. The expected duration of coumadin treatment is lifelong The reason for anticoagulation is  mechanical heart valve.  Present Coumadin dose: 7.5, 4, 4, repeat Goal: 2.5-3.5  Excessive bruising: no Nose bleeding: no Rectal bleeding: no Prolonged menstrual cycles: N/A Eating diet with consistent amounts of foods containing Vitamin K:yes Any recent antibiotic use? no  Relevant past medical, surgical, family and social history reviewed and updated as indicated. Interim medical history since our last visit reviewed. Allergies and medications reviewed and updated.  ROS: Per HPI unless specifically indicated above     Objective:    BP 103/68   Pulse 69   Wt (!) 311 lb (141.1 kg)   SpO2 95%   BMI 51.75 kg/m   Wt Readings from Last 3 Encounters:  04/03/18 (!) 311 lb (141.1 kg)  03/06/18 (!) 311 lb (141.1 kg)  01/16/18 (!) 309 lb (140.2 kg)     General: Well appearing, well nourished in no distress.  Normal mood and affect. Skin: No excessive bruising or rash  Last INR: 3.3 Last PT:  40.2   Last CBC:  Lab Results  Component Value Date   WBC 8.2 07/27/2017   HGB 16.1 07/27/2017   HCT 48.9 07/27/2017   MCV 86.6 07/27/2017   PLT 245 07/27/2017    Results for orders placed or performed during the hospital encounter of 03/15/18  Lipid panel  Result Value Ref Range   Cholesterol 225 (H) 0 - 200 mg/dL   Triglycerides 314 (H) <150 mg/dL   HDL 46 >40 mg/dL   Total CHOL/HDL Ratio 4.9 RATIO   VLDL 63 (H) 0 - 40 mg/dL   LDL Cholesterol 116 (H) 0 - 99 mg/dL       Assessment:     ICD-10-CM   1. Aortic valve replaced Z95.2 CoaguChek XS/INR Waived  2. Chronic anticoagulation Z79.01 CoaguChek XS/INR  Waived    Plan:   Discussed current plan face-to-face with patient. For coumadin dosing, elected to continue current dose. Will plan to recheck INR in 1 month.

## 2018-04-03 NOTE — Assessment & Plan Note (Signed)
Under good control on current regimen. Continue current regimen. Continue to monitor. Call with any concerns. Refills given.   

## 2018-04-04 ENCOUNTER — Telehealth: Payer: Self-pay | Admitting: Family Medicine

## 2018-04-04 DIAGNOSIS — R252 Cramp and spasm: Secondary | ICD-10-CM

## 2018-04-04 DIAGNOSIS — R251 Tremor, unspecified: Secondary | ICD-10-CM

## 2018-04-04 LAB — CBC WITH DIFFERENTIAL/PLATELET
Basophils Absolute: 0.1 10*3/uL (ref 0.0–0.2)
Basos: 1 %
EOS (ABSOLUTE): 0.2 10*3/uL (ref 0.0–0.4)
Eos: 1 %
Hematocrit: 45.6 % (ref 37.5–51.0)
Hemoglobin: 15.3 g/dL (ref 13.0–17.7)
IMMATURE GRANS (ABS): 0.1 10*3/uL (ref 0.0–0.1)
IMMATURE GRANULOCYTES: 1 %
Lymphocytes Absolute: 2.8 10*3/uL (ref 0.7–3.1)
Lymphs: 22 %
MCH: 29.3 pg (ref 26.6–33.0)
MCHC: 33.6 g/dL (ref 31.5–35.7)
MCV: 87 fL (ref 79–97)
MONOCYTES: 8 %
Monocytes Absolute: 0.9 10*3/uL (ref 0.1–0.9)
NEUTROS PCT: 67 %
Neutrophils Absolute: 8.2 10*3/uL — ABNORMAL HIGH (ref 1.4–7.0)
Platelets: 295 10*3/uL (ref 150–450)
RBC: 5.22 x10E6/uL (ref 4.14–5.80)
RDW: 14.2 % (ref 12.3–15.4)
WBC: 12.3 10*3/uL — AB (ref 3.4–10.8)

## 2018-04-04 LAB — COMPREHENSIVE METABOLIC PANEL
A/G RATIO: 1.5 (ref 1.2–2.2)
ALBUMIN: 4.2 g/dL (ref 3.6–4.8)
ALT: 17 IU/L (ref 0–44)
AST: 19 IU/L (ref 0–40)
Alkaline Phosphatase: 48 IU/L (ref 39–117)
BILIRUBIN TOTAL: 0.3 mg/dL (ref 0.0–1.2)
BUN/Creatinine Ratio: 21 (ref 10–24)
BUN: 25 mg/dL (ref 8–27)
CO2: 22 mmol/L (ref 20–29)
CREATININE: 1.2 mg/dL (ref 0.76–1.27)
Calcium: 9.6 mg/dL (ref 8.6–10.2)
Chloride: 102 mmol/L (ref 96–106)
GFR calc Af Amer: 74 mL/min/{1.73_m2} (ref >59)
GFR, EST NON AFRICAN AMERICAN: 64 mL/min/{1.73_m2} (ref >59)
Globulin, Total: 2.8 g/dL (ref 1.5–4.5)
Glucose: 80 mg/dL (ref 65–99)
Potassium: 4.4 mmol/L (ref 3.5–5.2)
SODIUM: 141 mmol/L (ref 134–144)
TOTAL PROTEIN: 7 g/dL (ref 6.0–8.5)

## 2018-04-04 LAB — URIC ACID: Uric Acid: 8.2 mg/dL (ref 3.7–8.6)

## 2018-04-04 LAB — TSH: TSH: 1.83 u[IU]/mL (ref 0.450–4.500)

## 2018-04-04 NOTE — Telephone Encounter (Signed)
I'm not sure what's causing it, so I'm going to refer him to neurology. They should be calling him.

## 2018-04-04 NOTE — Telephone Encounter (Signed)
Please let him know that his labs came back nice and normal. Thanks!

## 2018-04-04 NOTE — Telephone Encounter (Signed)
Message relayed to patient. Verbalized understanding. Would like to know if you have any thoughts/speculations to why he is having the hand tremers.

## 2018-04-04 NOTE — Telephone Encounter (Signed)
Called and spoke with [name] (on DPR) and let her know that Dr. Wynetta Emery said.

## 2018-04-05 ENCOUNTER — Telehealth: Payer: Self-pay | Admitting: Family Medicine

## 2018-04-05 MED ORDER — ALLOPURINOL 100 MG PO TABS: 100.0000 mg | ORAL_TABLET | Freq: Every day | ORAL | 1 refills | Status: DC

## 2018-04-05 NOTE — Telephone Encounter (Signed)
Patient notified

## 2018-04-05 NOTE — Telephone Encounter (Signed)
Copied from [place] [id number]. Topic: Quick Communication - Rx Refill/Question >> Apr 05, 2018  8:23 AM Oliver Pila B wrote: Medication: allopurinol (ZYLOPRIM) 100 MG tablet [[id number]] 90 day supply cyclobenzaprine (FLEXERIL) 10 MG tablet [[id number]] furosemide (LASIX) 40 MG tablet [[id number]]   Has the patient contacted their pharmacy? Yes.   (Agent: If no, request that the patient contact the pharmacy for the refill.) (Agent: If yes, when and what did the pharmacy advise?)  Preferred Pharmacy (with phone number or street name): [facility]  Agent: Please be advised that RX refills may take up to 3 business days. We ask that you follow-up with your pharmacy.

## 2018-04-05 NOTE — Telephone Encounter (Signed)
Flexeril was already refilled for him on 10/2- should be at the pharmacy.  Dr. Saunders Revel wrote the lasix with 90 with 2 refills in July- should not be due.   Allopurinol sent to his pharmacy today. Thanks!

## 2018-04-18 ENCOUNTER — Ambulatory Visit: Payer: Medicare Other | Admitting: Internal Medicine

## 2018-04-30 NOTE — Progress Notes (Deleted)
There were no vitals taken for this visit.   Subjective:    Patient ID: Roberto [name], male    DOB: Dec 12, 1955, 62 y.o.   MRN: [id number]  CC: Coumadin management  HPI: This patient is a 62 y.o. male who presents for coumadin management. The expected duration of coumadin treatment is {Blank single:19197::"lifelong","3 months","6 months"} The reason for anticoagulation is  {Blank single:19197::"A. Fib","thombophlebitis","mechanical heart valve","non-mechanical heart valve","Factor V Leiden","DVT/PE"}.  Present Coumadin dose: Goal: {Blank single:19197::"2.0-3.0","2.5-3.5"} The patient does not have an active anticoagulation episode. Excessive bruising: {Blank single:19197::"yes","no"} Nose bleeding: {Blank single:19197::"yes","no"} Rectal bleeding: {Blank single:19197::"yes","no"} Prolonged menstrual cycles: {Blank single:19197::"yes","no","N/A"} Eating diet with consistent amounts of foods containing Vitamin K:{Blank single:19197::"yes","no"} Any recent antibiotic use? {Blank single:19197::"yes","no"}  Relevant past medical, surgical, family and social history reviewed and updated as indicated. Interim medical history since our last visit reviewed. Allergies and medications reviewed and updated.  ROS: Per HPI unless specifically indicated above     Objective:    There were no vitals taken for this visit.  Wt Readings from Last 3 Encounters:  04/03/18 (!) 311 lb (141.1 kg)  03/06/18 (!) 311 lb (141.1 kg)  01/16/18 (!) 309 lb (140.2 kg)     General: Well appearing, well nourished in no distress.  Normal mood and affect. Skin: No excessive bruising or rash  Last INR:     Last CBC:  Lab Results  Component Value Date   WBC 12.3 (H) 04/03/2018   HGB 15.3 04/03/2018   HCT 45.6 04/03/2018   MCV 87 04/03/2018   PLT 295 04/03/2018    Results for orders placed or performed in visit on 04/03/18  CoaguChek XS/INR Waived  Result Value Ref Range   INR 3.3 (H) 0.9 - 1.1   Prothrombin Time 40.2 sec  Comprehensive metabolic panel  Result Value Ref Range   Glucose 80 65 - 99 mg/dL   BUN 25 8 - 27 mg/dL   Creatinine, Ser 1.20 0.76 - 1.27 mg/dL   GFR calc non Af Amer 64 >59 mL/min/1.73   GFR calc Af Amer 74 >59 mL/min/1.73   BUN/Creatinine Ratio 21 10 - 24   Sodium 141 134 - 144 mmol/L   Potassium 4.4 3.5 - 5.2 mmol/L   Chloride 102 96 - 106 mmol/L   CO2 22 20 - 29 mmol/L   Calcium 9.6 8.6 - 10.2 mg/dL   Total Protein 7.0 6.0 - 8.5 g/dL   Albumin 4.2 3.6 - 4.8 g/dL   Globulin, Total 2.8 1.5 - 4.5 g/dL   Albumin/Globulin Ratio 1.5 1.2 - 2.2   Bilirubin Total 0.3 0.0 - 1.2 mg/dL   Alkaline Phosphatase 48 39 - 117 IU/L   AST 19 0 - 40 IU/L   ALT 17 0 - 44 IU/L  TSH  Result Value Ref Range   TSH 1.830 0.450 - 4.500 uIU/mL  CBC with Differential/Platelet  Result Value Ref Range   WBC 12.3 (H) 3.4 - 10.8 x10E3/uL   RBC 5.22 4.14 - 5.80 x10E6/uL   Hemoglobin 15.3 13.0 - 17.7 g/dL   Hematocrit 45.6 37.5 - 51.0 %   MCV 87 79 - 97 fL   MCH 29.3 26.6 - 33.0 pg   MCHC 33.6 31.5 - 35.7 g/dL   RDW 14.2 12.3 - 15.4 %   Platelets 295 150 - 450 x10E3/uL   Neutrophils 67 Not Estab. %   Lymphs 22 Not Estab. %   Monocytes 8 Not Estab. %   Eos 1 Not Estab. %  Basos 1 Not Estab. %   Neutrophils Absolute 8.2 (H) 1.4 - 7.0 x10E3/uL   Lymphocytes Absolute 2.8 0.7 - 3.1 x10E3/uL   Monocytes Absolute 0.9 0.1 - 0.9 x10E3/uL   EOS (ABSOLUTE) 0.2 0.0 - 0.4 x10E3/uL   Basophils Absolute 0.1 0.0 - 0.2 x10E3/uL   Immature Granulocytes 1 Not Estab. %   Immature Grans (Abs) 0.1 0.0 - 0.1 x10E3/uL  Uric acid  Result Value Ref Range   Uric Acid 8.2 3.7 - 8.6 mg/dL  Bayer DCA Hb A1c Waived  Result Value Ref Range   HB A1C (BAYER DCA - WAIVED) 5.9 <7.0 %       Assessment:     ICD-10-CM   1. Valvular heart disease I38 CoaguChek XS/INR Waived    Plan:   Discussed current plan face-to-face with patient. For coumadin dosing, elected to {Blank single:19197::"continue  current dose","change dose to","hold dose"}. Will plan to recheck INR in {Blank single:19197::"1 month","1 week","2 weeks"}.

## 2018-05-01 ENCOUNTER — Ambulatory Visit: Payer: Medicare Other | Admitting: Family Medicine

## 2018-05-27 ENCOUNTER — Ambulatory Visit: Payer: Medicare Other | Admitting: Family Medicine

## 2018-05-31 ENCOUNTER — Ambulatory Visit: Payer: Medicare Other | Admitting: Family Medicine

## 2018-06-02 ENCOUNTER — Other Ambulatory Visit: Payer: Self-pay | Admitting: Family Medicine

## 2018-06-03 NOTE — Telephone Encounter (Signed)
Attempted to called pt to clarify who is refilling the lisinopril 5 mg tab. No answer, left message for patient to call back.

## 2018-06-03 NOTE — Telephone Encounter (Signed)
Refill for lisinopril 5 mg tab.  Prescription has historical provider. No amount is given on the prescription. A duplicate medication was discontinued on 08/27/17.  LOV  04/03/18 NOV  06/06/18  Provider : Mellody Memos, DO

## 2018-06-05 ENCOUNTER — Ambulatory Visit (INDEPENDENT_AMBULATORY_CARE_PROVIDER_SITE_OTHER): Payer: Medicare Other | Admitting: Internal Medicine

## 2018-06-05 ENCOUNTER — Encounter: Payer: Self-pay | Admitting: Internal Medicine

## 2018-06-05 VITALS — BP 134/80 | HR 71 | Ht 66.0 in | Wt 331.5 lb

## 2018-06-05 DIAGNOSIS — Z8679 Personal history of other diseases of the circulatory system: Secondary | ICD-10-CM | POA: Diagnosis not present

## 2018-06-05 DIAGNOSIS — I10 Essential (primary) hypertension: Secondary | ICD-10-CM | POA: Diagnosis not present

## 2018-06-05 DIAGNOSIS — E785 Hyperlipidemia, unspecified: Secondary | ICD-10-CM | POA: Diagnosis not present

## 2018-06-05 DIAGNOSIS — I5022 Chronic systolic (congestive) heart failure: Secondary | ICD-10-CM

## 2018-06-05 DIAGNOSIS — Z952 Presence of prosthetic heart valve: Secondary | ICD-10-CM

## 2018-06-05 MED ORDER — LISINOPRIL 5 MG PO TABS: 5.0000 mg | ORAL_TABLET | Freq: Every day | ORAL | 2 refills | Status: DC

## 2018-06-05 NOTE — Progress Notes (Signed)
Follow-up Outpatient Visit Date: 06/05/2018  Primary Care Provider: Valerie Roys, DO [place] [address]  Chief Complaint: Follow-up cardiomyopathy and valvular heart disease  HPI:  [name] is a 62 y.o. year-old male with history of mechanical aortic valve replacement, stroke, right renal infarct, chronic systolic heart failure (LVEF 40-45%), and hyperlipidemia, who presents for follow-up of cardiomyopathy and valvular heart disease.  I last saw him in July, at which time he was doing well other than occasional mild dependent lower extremity edema.  He had increased his smoking to 1.5 packs/day and was contemplating hypnosis to help him quit.  Today, Mr. Sattar feels well from a heart standpoint.  He has been dealing with right foot plantar fasciitis over the last several months, which has limited his activity.  He has been unable to exercise on a regular basis due to this.  He attributes his 20 pound weight gain over the last 2 months to this.  He denies orthopnea, PND, and edema.  He has not had significant shortness of breath nor chest pain.  He remains compliant with his medications, including aspirin and warfarin.  Last INR 2 months ago was 3.3; he is scheduled for f/u (including INR check) with his PCP tomorrow.  Mr. Veltre is concerned that he may be developing bronchitis, noting recent chest congestion and cough.  He often experiences this in the fall.  He also notes intermittent "hardening" of his abdomen, particularly after eating certain foods like lettuce or chicken.  He has cut down his smoking to 1/2 PPD.  He did not proceed with hypnosis.  --------------------------------------------------------------------------------------------------  Cardiovascular History & Procedures: Cardiovascular Problems:  Bicuspid aortic valve status post aortic valve replacement (01/01/62)  Chronic systolic heart failure  Stroke  Renal infarct  Risk  Factors:  Hyperlipidemia, stroke  Cath/PCI:  LHC/RHC (03/21/10, [place], [place]): No significant CAD. LVEDP 31 mmHg. Mean AoV gradient 34 mmHg at rest and 47 mmHg with dobutamine 20 mcg/kg/min. AVA 1.0 cm^2. RA 31, RV 68/25, PA 68/47, PCWP 38. PA sat 65%. CO 6.2 L/min (Fick) and 5.3 L/min (thermodilution).  CV Surgery:  Mechanical aortic valve replacement and ascending aortoplasty (03/25/10, [place], [place]): #27 Carbomedics mechanical valve.  EP Procedures and Devices:  None available  Non-Invasive Evaluation(s):  Transthoracic echocardiogram (06/05/16): Technically difficult study. Moderately dilated left ventricle with moderate LVH. Moderately reduced LV contraction (EF 40-45%). AVR well-seated with 14 mmHg gradient. Peak AV velocity 2.5 m/s. Mild mitral valve thickening with mild MR. Mildly dilated RV with mildly reduced contraction.  Transthoracic echocardiogram (05/13/14, [facility]): Mild LVH with LVEF 35-40% and global hypokinesis. Mechanical aortic valve present with trace regurgitation. Normal LVOT gradient. Mild TR. Mild pulmonary hypertension. Normal RV size and function.  Recent CV Pertinent Labs: Lab Results  Component Value Date   CHOL 225 (H) 03/15/2018   CHOL 177 05/17/2017   CHOL 131 05/25/2014   HDL 46 03/15/2018   HDL 38 (L) 10/24/2016   HDL 30 (L) 05/25/2014   LDLCALC 116 (H) 03/15/2018   LDLCALC 70 10/24/2016   LDLCALC 68 05/25/2014   TRIG 314 (H) 03/15/2018   TRIG 361 (H) 05/17/2017   TRIG 167 05/25/2014   CHOLHDL 4.9 03/15/2018   INR 3.3 (H) 04/03/2018   INR 1.9 05/25/2014   K 4.4 04/03/2018   K 3.6 05/25/2014   BUN 25 04/03/2018   BUN 15 05/25/2014   CREATININE 1.20 04/03/2018   CREATININE 0.85  05/25/2014    Past medical and surgical history were reviewed and updated in EPIC.  Current Meds  Medication Sig  . albuterol (PROVENTIL HFA;VENTOLIN HFA) 108 (90 Base) MCG/ACT inhaler  Inhale 2 puffs into the lungs every 6 (six) hours as needed for wheezing or shortness of breath.  Marland Kitchen albuterol (PROVENTIL) (2.5 MG/3ML) 0.083% nebulizer solution Take 3 mLs (2.5 mg total) by nebulization every 6 (six) hours as needed for wheezing or shortness of breath.  . allopurinol (ZYLOPRIM) 100 MG tablet Take 1 tablet (100 mg total) by mouth daily.  Marland Kitchen aspirin 81 MG chewable tablet Chew 81 mg by mouth daily.    . cyclobenzaprine (FLEXERIL) 10 MG tablet TAKE 1 TABLET BY MOUTH TWICE DAILY AS NEEDED FOR MUSCLE SPASM  . Fluticasone-Salmeterol (ADVAIR) 250-50 MCG/DOSE AEPB Inhale 1 puff into the lungs 2 (two) times daily as needed.  . furosemide (LASIX) 40 MG tablet Take 1 tablet (40 mg total) by mouth daily. (Patient taking differently: Take 40 mg by mouth daily as needed. )  . lisinopril (PRINIVIL,ZESTRIL) 5 MG tablet Take 1 tablet (5 mg total) by mouth daily.  . metoprolol succinate (TOPROL-XL) 100 MG 24 hr tablet TAKE 1 TABLET BY MOUTH ONCE DAILY WITH  OR  IMMEDIATELY  FOLLOWING  A  MEAL  . simvastatin (ZOCOR) 40 MG tablet Take 1 tablet (40 mg total) by mouth at bedtime.  Marland Kitchen spironolactone (ALDACTONE) 25 MG tablet Take 0.5 tablets (12.5 mg total) by mouth daily.  Marland Kitchen tiotropium (SPIRIVA) 18 MCG inhalation capsule Place 18 mcg into inhaler and inhale daily as needed.  . VENTOLIN HFA 108 (90 Base) MCG/ACT inhaler INHALE 2 PUFFS BY MOUTH EVERY 6 HOURS AS NEEDED FOR WHEEZING AND OR SHORTNESS OF BREATH  . warfarin (COUMADIN) 4 MG tablet Take as directed  . warfarin (COUMADIN) 7.5 MG tablet Take as directed  . [DISCONTINUED] lisinopril (PRINIVIL,ZESTRIL) 5 MG tablet TAKE 1 TABLET BY MOUTH ONCE DAILY    Allergies: Patient has no known allergies.  Social History   Tobacco Use  . Smoking status: Current Every Day Smoker    Packs/day: 1.50    Years: 40.00    Pack years: 60.00    Types: Cigarettes    Start date: 07/16/2017  . Smokeless tobacco: Never Used  . Tobacco comment: 3 cigarettes/ day   Substance Use Topics  . Alcohol use: No    Alcohol/week: 0.0 standard drinks  . Drug use: No    Family History  Problem Relation Age of Onset  . Arthritis Mother   . Cancer Mother        colon  . Arthritis Father   . Diabetes Father   . Stroke Father   . Cancer Father        colon  . Cancer Brother        brain  . Cancer Sister        breast  . Heart disease Neg Hx     Review of Systems: A 12-system review of systems was performed and was negative except as noted in the HPI.  --------------------------------------------------------------------------------------------------  Physical Exam: BP 134/80 (BP Location: Left Arm, Patient Position: Sitting, Cuff Size: Large)   Pulse 71   Ht 5\' 6"  (1.676 m)   Wt (!) 331 lb 8 oz (150.4 kg)   BMI 53.51 kg/m   General:  Morbidly obese man, seated comfortably in the exam room. HEENT: No conjunctival pallor or scleral icterus. Moist mucous membranes.  OP clear. Neck: Supple without lymphadenopathy,  thyromegaly, JVD, or HJR, though evaluation is limited by body habitus. Lungs: Normal work of breathing. Clear to auscultation bilaterally without wheezes or crackles. Heart: Regular rate and rhythm without murmurs, rubs, or gallops.  Mechanical S2 noted. Unable to assess PMI due to body habitus. Abd: Bowel sounds present. Soft, NT/ND.  Unable to assess HSM due to body habitus. Ext: Trace pretibial edema bilaterally. Skin: Warm and dry without rash.  EKG:  NSR with lateral T-wave inversions.  No significant change from prior tacking on 717/19.  Lab Results  Component Value Date   WBC 12.3 (H) 04/03/2018   HGB 15.3 04/03/2018   HCT 45.6 04/03/2018   MCV 87 04/03/2018   PLT 295 04/03/2018    Lab Results  Component Value Date   NA 141 04/03/2018   K 4.4 04/03/2018   CL 102 04/03/2018   CO2 22 04/03/2018   BUN 25 04/03/2018   CREATININE 1.20 04/03/2018   GLUCOSE 80 04/03/2018   ALT 17 04/03/2018    Lab Results  Component  Value Date   CHOL 225 (H) 03/15/2018   HDL 46 03/15/2018   LDLCALC 116 (H) 03/15/2018   TRIG 314 (H) 03/15/2018   CHOLHDL 4.9 03/15/2018    --------------------------------------------------------------------------------------------------  ASSESSMENT AND PLAN: Chronic systolic heart failure Mr. Lingard reports stable NYHA class II symptoms, though his mobility has been limited over the last few months due to plantar fasciitis.  His weight is up 20 pounds over the last 2 months.  While inactivity could be contributing to this, fluid retention is a concern.  Other than trace chronic leg edema, he is grossly euvolemic (though morbid obesity limits evaluation).  We will continue his current medications, including as needed furosemide, lisinopril 5 mg daily, spironolactone 12.5 mg daily, and metoprolol succinate 100 mg daily.  Normal K and creatinine noted on labs in early October.  History of aortic stenosis status post mechanical AVR No evidence of valve dysfunction by symptoms or exam.  We will continue with ASA and warfarin (managed by PCP).  SBE prophylaxis recommended for dental procedures.  Hypertension BP mildly elevated.  Weight loss and sodium restriction recommended.  No changes to current medication regimen.  Hyperlipidemia Continue simvastatin.  Defer lipid panel to PCP.  Morbid obesity Weight increased further with recent immobility.  I encouraged him to increase his activity and to modify his diet.  Formal podiatry consultation may be helpful if foot pain persists.  Follow-up: Return to clinic in 4 months.  Nelva Bush, MD 06/05/2018 9:53 PM

## 2018-06-05 NOTE — Patient Instructions (Signed)
Medication Instructions:  Your physician recommends that you continue on your current medications as directed. Please refer to the Current Medication list given to you today.  If you need a refill on your cardiac medications before your next appointment, please call your pharmacy.   Lab work: none If you have labs (blood work) drawn today and your tests are completely normal, you will receive your results only by: Marland Kitchen MyChart Message (if you have MyChart) OR . A paper copy in the mail If you have any lab test that is abnormal or we need to change your treatment, we will call you to review the results.  Testing/Procedures: none  Follow-Up: At [facility], you and your health needs are our priority.  As part of our continuing mission to provide you with exceptional heart care, we have created designated Provider Care Teams.  These Care Teams include your primary Cardiologist (physician) and Advanced Practice Providers (APPs -  Physician Assistants and Nurse Practitioners) who all work together to provide you with the care you need, when you need it. You will need a follow up appointment in 4 months.  Please call our office 2 months in advance to schedule this appointment.  You may see DR Harrell Gave END or one of the following Advanced Practice Providers on your designated Care Team:   Murray Hodgkins, NP Christell Faith, [name] . Marrianne Mood, [name]

## 2018-06-06 ENCOUNTER — Ambulatory Visit (INDEPENDENT_AMBULATORY_CARE_PROVIDER_SITE_OTHER): Payer: Medicare Other | Admitting: Nurse Practitioner

## 2018-06-06 ENCOUNTER — Encounter: Payer: Self-pay | Admitting: Nurse Practitioner

## 2018-06-06 VITALS — BP 144/84 | HR 59 | Temp 98.7°F | Ht 66.0 in | Wt 331.0 lb

## 2018-06-06 DIAGNOSIS — Z952 Presence of prosthetic heart valve: Secondary | ICD-10-CM | POA: Diagnosis not present

## 2018-06-06 LAB — COAGUCHEK XS/INR WAIVED
INR: 2.9 — ABNORMAL HIGH (ref 0.9–1.1)
Prothrombin Time: 34.6 s

## 2018-06-06 MED ORDER — WARFARIN SODIUM 4 MG PO TABS: 4.0000 mg | ORAL_TABLET | Freq: Every day | ORAL | 3 refills | Status: DC

## 2018-06-06 NOTE — Progress Notes (Signed)
BP (!) 144/84   Pulse (!) 59   Temp 98.7 F (37.1 C) (Oral)   Ht 5\' 6"  (1.676 m)   Wt (!) 331 lb (150.1 kg)   SpO2 97%   BMI 53.42 kg/m     Subjective:    Patient ID: Roberto [name], male    DOB: Nov 18, 1955, 62 y.o.   MRN: [id number]  CC: Coumadin management  HPI: This patient is a 62 y.o. male who presents for coumadin management. The expected duration of coumadin treatment is lifelong The reason for anticoagulation is  mechanical heart valve.  He was seen by Dr. Saunders Revel yesterday with cardiology.  Present Coumadin dose: Goal: 2.5-3.5  The patient does not have an active anticoagulation episode. Excessive bruising: no Nose bleeding: no Rectal bleeding: no Eating diet with consistent amounts of foods containing Vitamin K:yes Any recent antibiotic use? no  Relevant past medical, surgical, family and social history reviewed and updated as indicated. Interim medical history since our last visit reviewed. Allergies and medications reviewed and updated.  ROS: Per HPI unless specifically indicated above     Objective:    BP (!) 144/84   Pulse (!) 59   Temp 98.7 F (37.1 C) (Oral)   Ht 5\' 6"  (1.676 m)   Wt (!) 331 lb (150.1 kg)   SpO2 97%   BMI 53.42 kg/m    Wt Readings from Last 3 Encounters:  06/06/18 (!) 331 lb (150.1 kg)  06/05/18 (!) 331 lb 8 oz (150.4 kg)  04/03/18 (!) 311 lb (141.1 kg)     General: Well appearing, well nourished in no distress.  Normal mood and affect. Skin: No excessive bruising or rash  Last INR: 3.3    Last CBC:  Lab Results  Component Value Date   WBC 12.3 (H) 04/03/2018   HGB 15.3 04/03/2018   HCT 45.6 04/03/2018   MCV 87 04/03/2018   PLT 295 04/03/2018    Results for orders placed or performed in visit on 04/03/18  CoaguChek XS/INR Waived  Result Value Ref Range   INR 3.3 (H) 0.9 - 1.1   Prothrombin Time 40.2 sec  Comprehensive metabolic panel  Result Value Ref Range   Glucose 80 65 - 99 mg/dL   BUN 25 8 - 27 mg/dL   Creatinine, Ser 1.20 0.76 - 1.27 mg/dL   GFR calc non Af Amer 64 >59 mL/min/1.73   GFR calc Af Amer 74 >59 mL/min/1.73   BUN/Creatinine Ratio 21 10 - 24   Sodium 141 134 - 144 mmol/L   Potassium 4.4 3.5 - 5.2 mmol/L   Chloride 102 96 - 106 mmol/L   CO2 22 20 - 29 mmol/L   Calcium 9.6 8.6 - 10.2 mg/dL   Total Protein 7.0 6.0 - 8.5 g/dL   Albumin 4.2 3.6 - 4.8 g/dL   Globulin, Total 2.8 1.5 - 4.5 g/dL   Albumin/Globulin Ratio 1.5 1.2 - 2.2   Bilirubin Total 0.3 0.0 - 1.2 mg/dL   Alkaline Phosphatase 48 39 - 117 IU/L   AST 19 0 - 40 IU/L   ALT 17 0 - 44 IU/L  TSH  Result Value Ref Range   TSH 1.830 0.450 - 4.500 uIU/mL  CBC with Differential/Platelet  Result Value Ref Range   WBC 12.3 (H) 3.4 - 10.8 x10E3/uL   RBC 5.22 4.14 - 5.80 x10E6/uL   Hemoglobin 15.3 13.0 - 17.7 g/dL   Hematocrit 45.6 37.5 - 51.0 %   MCV 87 79 - 97  fL   MCH 29.3 26.6 - 33.0 pg   MCHC 33.6 31.5 - 35.7 g/dL   RDW 14.2 12.3 - 15.4 %   Platelets 295 150 - 450 x10E3/uL   Neutrophils 67 Not Estab. %   Lymphs 22 Not Estab. %   Monocytes 8 Not Estab. %   Eos 1 Not Estab. %   Basos 1 Not Estab. %   Neutrophils Absolute 8.2 (H) 1.4 - 7.0 x10E3/uL   Lymphocytes Absolute 2.8 0.7 - 3.1 x10E3/uL   Monocytes Absolute 0.9 0.1 - 0.9 x10E3/uL   EOS (ABSOLUTE) 0.2 0.0 - 0.4 x10E3/uL   Basophils Absolute 0.1 0.0 - 0.2 x10E3/uL   Immature Granulocytes 1 Not Estab. %   Immature Grans (Abs) 0.1 0.0 - 0.1 x10E3/uL  Uric acid  Result Value Ref Range   Uric Acid 8.2 3.7 - 8.6 mg/dL  Bayer DCA Hb A1c Waived  Result Value Ref Range   HB A1C (BAYER DCA - WAIVED) 5.9 <7.0 %       Assessment:     ICD-10-CM   1. H/O mechanical aortic valve replacement Z95.2 CoaguChek XS/INR Waived    Plan:   Discussed current plan face-to-face with patient. For coumadin dosing, elected to continue current dose. Will plan to recheck INR in 1 month.

## 2018-06-06 NOTE — Assessment & Plan Note (Signed)
Current INR 2.9.  Continue current Coumadin regimen.  Call with any concerns.  Recheck in one month.

## 2018-06-06 NOTE — Patient Instructions (Signed)
Bleeding Precautions When on Anticoagulant Therapy  WHAT IS ANTICOAGULANT THERAPY?  Anticoagulant therapy is taking medicine to prevent or reduce blood clots. It is also called blood thinner therapy. Blood clots that form in your blood vessels can be dangerous. They can break loose and travel to your heart, lungs, or brain. This increases your risk of a heart attack or stroke. Anticoagulant therapy causes blood to clot more slowly.  You may need anticoagulant therapy if you have:   A medical condition that increases the likelihood that blood clots will form.   A heart defect or a problem with heart rhythm.  It is also a common treatment after heart surgery, such as valve replacement.  WHAT ARE COMMON TYPES OF ANTICOAGULANT THERAPY?  Anticoagulant medicine can be injected or taken by mouth.If you need anticoagulant therapy quickly at the hospital, the medicine may be injected under your skin or given through an IV tube. Heparin is a common example of an anticoagulant that you may get at the hospital.  Most anticoagulant therapy is in the form of pills that you take at home every day. These may include:   Aspirin. This common blood thinner works by preventing blood cells (platelets) from sticking together to form a clot. Aspirin is not as strong as anticoagulants that slow down the time that it takes for your body to form a clot.   Clopidogrel. This is a newer type of drug that affects platelets. It is stronger than aspirin.   Warfarin. This is the most common anticoagulant. It changes the way your body uses vitamin K, a vitamin that helps your blood to clot. The risk of bleeding is higher with warfarin than with aspirin. You will need frequent blood tests to make sure you are taking the safest amount.   New anticoagulants. Several new drugs have been approved. They are all taken by mouth. Studies show that these drugs work as well as warfarin. They do not require blood testing. They may cause less bleeding  risk than warfarin.  WHAT DO I NEED TO REMEMBER WHEN TAKING ANTICOAGULANT THERAPY?  Anticoagulant therapy decreases your risk of forming a blood clot, but it increases your risk of bleeding. Work closely with your health care provider to make sure you are taking your medicine safely. These tips can help:   Learn ways to reduce your risk of bleeding.   If you are taking warfarin:  ? Have blood tests as ordered by your health care provider.  ? Do not make any sudden changes to your diet. Vitamin K in your diet can make warfarin less effective.  ? Do not get pregnant. This medicine may cause birth defects.   Take your medicine at the same time every day. If you forget to take your medicine, take it as soon as you remember. If you miss a whole day, do not double your dose of medicine. Take your normal dose and call your health care provider to check in.   Do not stop taking your medicine on your own.   Tell your health care provider before you start taking any new medicine, vitamin, or herbal product. Some of these could interfere with your therapy.   Tell all of your health care providers that you are on anticoagulant therapy.   Do not have surgery, medical procedures, or dental work until you tell your health care provider that you are on anticoagulant therapy.  WHAT CAN AFFECT HOW ANTICOAGULANTS WORK?  Certain foods, vitamins, medicines, supplements, and herbal   medicines change the way that anticoagulant therapy works. They may increase or decrease the effects of your anticoagulant therapy. Either result can be dangerous for you.   Many over-the-counter medicines for pain, colds, or stomach problems interfere with anticoagulant therapy. Take these only as told by your health care provider.   Do not drink alcohol. It can interfere with your medicine and increase your risk of an injury that causes bleeding.   If you are taking warfarin, do not begin eating more foods that contain vitamin K. These include  leafy green vegetables. Ask your health care provider if you should avoid any foods.  WHAT ARE SOME WAYS TO PREVENT BLEEDING?  You can prevent bleeding by taking certain precautions:   Be extra careful when you use knives, scissors, or other sharp objects.   Use an electric razor instead of a blade.   Do not use toothpicks.   Use a soft toothbrush.   Wear shoes that have nonskid soles.   Use bath mats and handrails in your bathroom.   Wear gloves while you do yard work.   Wear a helmet when you ride a bike.   Wear your seat belt.   Prevent falls by removing loose rugs and extension cords from areas where you walk.   Do not play contact sports or participate in other activities that have a high risk of injury.  WHEN SHOULD I CONTACT MY HEALTH CARE PROVIDER?  Call your health care provider if:   You miss a dose of medicine:  ? And you are not sure what to do.  ? For more than one day.   You have:  ? Menstrual bleeding that is heavier than normal.  ? Blood in your urine.  ? A bloody nose or bleeding gums.  ? Easy bruising.  ? Blood in your stool (feces) or have black and tarry stool.  ? Side effects from your medicine.   You feel weak or dizzy.   You become pregnant.  Seek immediate medical care if:   You have bleeding that will not stop.   You have sudden and severe headache or belly pain.   You vomit or you cough up bright red blood.   You have a severe blow to your head.  WHAT ARE SOME QUESTIONS TO ASK MY HEALTH CARE PROVIDER?   What is the best anticoagulant therapy for my condition?   What side effects should I watch for?   When should I take my medicine? What should I do if I forget to take it?   Will I need to have regular blood tests?   Do I need to change my diet? Are there foods or drinks that I should avoid?   What activities are safe for me?   What should I do if I want to get pregnant?  This information is not intended to replace advice given to you by your health care provider.  Make sure you discuss any questions you have with your health care provider.  Document Released: 05/31/2015 Document Reviewed: 05/31/2015  Elsevier Interactive Patient Education  2017 [employer]

## 2018-07-11 ENCOUNTER — Ambulatory Visit (INDEPENDENT_AMBULATORY_CARE_PROVIDER_SITE_OTHER): Payer: Medicare Other

## 2018-07-11 VITALS — BP 115/66 | HR 80 | Temp 97.8°F | Ht 66.0 in | Wt 329.6 lb

## 2018-07-11 DIAGNOSIS — E119 Type 2 diabetes mellitus without complications: Secondary | ICD-10-CM

## 2018-07-11 DIAGNOSIS — Z0000 Encounter for general adult medical examination without abnormal findings: Secondary | ICD-10-CM | POA: Diagnosis not present

## 2018-07-11 NOTE — Patient Instructions (Addendum)
Roberto [name] , Thank you for taking time to come for your Medicare Wellness Visit. I appreciate your ongoing commitment to your health goals. Please review the following plan we discussed and let me know if I can assist you in the future.   Screening recommendations/referrals: Colonoscopy: Due, declines Diabetic Eye Exam: Due. Referral to [facility] placed today Diabetic Foot Exam: Due, Dr Wynetta Emery will complete at next visit Recommended yearly dental visit for hygiene and checkup  Vaccinations: Influenza vaccine: Up to date Pneumococcal vaccine: Due at age 22  Tdap vaccine: Up to date, next due 06/2024 Shingles vaccine: Due, declines     Advanced directives: Advance directive discussed with you today. I have provided a copy for you to complete at home and have notarized. Once this is complete please bring a copy in to our office so we can scan it into your chart.   Conditions/risks identified: Obese, recommend starting a routine exercise program at least 3 days a week for 30-45 minutes at a time as tolerated.   Next appointment: 07/19/2018 at 9:30 am with Dr. Wynetta Emery. Follow up in 1 year for your annual wellness visit.   Preventive Care 40-64 Years, Male Preventive care refers to lifestyle choices and visits with your health care provider that can promote health and wellness. What does preventive care include?  A yearly physical exam. This is also called an annual well check.  Dental exams once or twice a year.  Routine eye exams. Ask your health care provider how often you should have your eyes checked.  Personal lifestyle choices, including:  Daily care of your teeth and gums.  Regular physical activity.  Eating a healthy diet.  Avoiding tobacco and drug use.  Limiting alcohol use.  Practicing safe sex.  Taking low-dose aspirin every day starting at age 44. What happens during an annual well check? The services and screenings done by your health care provider  during your annual well check will depend on your age, overall health, lifestyle risk factors, and family history of disease. Counseling  Your health care provider may ask you questions about your:  Alcohol use.  Tobacco use.  Drug use.  Emotional well-being.  Home and relationship well-being.  Sexual activity.  Eating habits.  Work and work Statistician. Screening  You may have the following tests or measurements:  Height, weight, and BMI.  Blood pressure.  Lipid and cholesterol levels. These may be checked every 5 years, or more frequently if you are over 21 years old.  Skin check.  Lung cancer screening. You may have this screening every year starting at age 36 if you have a 30-pack-year history of smoking and currently smoke or have quit within the past 15 years.  Fecal occult blood test (FOBT) of the stool. You may have this test every year starting at age 78.  Flexible sigmoidoscopy or colonoscopy. You may have a sigmoidoscopy every 5 years or a colonoscopy every 10 years starting at age 69.  Prostate cancer screening. Recommendations will vary depending on your family history and other risks.  Hepatitis C blood test.  Hepatitis B blood test.  Sexually transmitted disease (STD) testing.  Diabetes screening. This is done by checking your blood sugar (glucose) after you have not eaten for a while (fasting). You may have this done every 1-3 years. Discuss your test results, treatment options, and if necessary, the need for more tests with your health care provider. Vaccines  Your health care provider may recommend certain vaccines,  such as:  Influenza vaccine. This is recommended every year.  Tetanus, diphtheria, and acellular pertussis (Tdap, Td) vaccine. You may need a Td booster every 10 years.  Zoster vaccine. You may need this after age 34.  Pneumococcal 13-valent conjugate (PCV13) vaccine. You may need this if you have certain conditions and have not  been vaccinated.  Pneumococcal polysaccharide (PPSV23) vaccine. You may need one or two doses if you smoke cigarettes or if you have certain conditions. Talk to your health care provider about which screenings and vaccines you need and how often you need them. This information is not intended to replace advice given to you by your health care provider. Make sure you discuss any questions you have with your health care provider. Document Released: 07/16/2015 Document Revised: 03/08/2016 Document Reviewed: 04/20/2015 Elsevier Interactive Patient Education  2017 [employer] Prevention in the Home Falls can cause injuries. They can happen to people of all ages. There are many things you can do to make your home safe and to help prevent falls. What can I do on the outside of my home?  Regularly fix the edges of walkways and driveways and fix any cracks.  Remove anything that might make you trip as you walk through a door, such as a raised step or threshold.  Trim any bushes or trees on the path to your home.  Use bright outdoor lighting.  Clear any walking paths of anything that might make someone trip, such as rocks or tools.  Regularly check to see if handrails are loose or broken. Make sure that both sides of any steps have handrails.  Any raised decks and porches should have guardrails on the edges.  Have any leaves, snow, or ice cleared regularly.  Use sand or salt on walking paths during winter.  Clean up any spills in your garage right away. This includes oil or grease spills. What can I do in the bathroom?  Use night lights.  Install grab bars by the toilet and in the tub and shower. Do not use towel bars as grab bars.  Use non-skid mats or decals in the tub or shower.  If you need to sit down in the shower, use a plastic, non-slip stool.  Keep the floor dry. Clean up any water that spills on the floor as soon as it happens.  Remove soap buildup in the tub or  shower regularly.  Attach bath mats securely with double-sided non-slip rug tape.  Do not have throw rugs and other things on the floor that can make you trip. What can I do in the bedroom?  Use night lights.  Make sure that you have a light by your bed that is easy to reach.  Do not use any sheets or blankets that are too big for your bed. They should not hang down onto the floor.  Have a firm chair that has side arms. You can use this for support while you get dressed.  Do not have throw rugs and other things on the floor that can make you trip. What can I do in the kitchen?  Clean up any spills right away.  Avoid walking on wet floors.  Keep items that you use a lot in easy-to-reach places.  If you need to reach something above you, use a strong step stool that has a grab bar.  Keep electrical cords out of the way.  Do not use floor polish or wax that makes floors slippery. If you  must use wax, use non-skid floor wax.  Do not have throw rugs and other things on the floor that can make you trip. What can I do with my stairs?  Do not leave any items on the stairs.  Make sure that there are handrails on both sides of the stairs and use them. Fix handrails that are broken or loose. Make sure that handrails are as long as the stairways.  Check any carpeting to make sure that it is firmly attached to the stairs. Fix any carpet that is loose or worn.  Avoid having throw rugs at the top or bottom of the stairs. If you do have throw rugs, attach them to the floor with carpet tape.  Make sure that you have a light switch at the top of the stairs and the bottom of the stairs. If you do not have them, ask someone to add them for you. What else can I do to help prevent falls?  Wear shoes that:  Do not have high heels.  Have rubber bottoms.  Are comfortable and fit you well.  Are closed at the toe. Do not wear sandals.  If you use a stepladder:  Make sure that it is fully  opened. Do not climb a closed stepladder.  Make sure that both sides of the stepladder are locked into place.  Ask someone to hold it for you, if possible.  Clearly mark and make sure that you can see:  Any grab bars or handrails.  First and last steps.  Where the edge of each step is.  Use tools that help you move around (mobility aids) if they are needed. These include:  Canes.  Walkers.  Scooters.  Crutches.  Turn on the lights when you go into a dark area. Replace any light bulbs as soon as they burn out.  Set up your furniture so you have a clear path. Avoid moving your furniture around.  If any of your floors are uneven, fix them.  If there are any pets around you, be aware of where they are.  Review your medicines with your doctor. Some medicines can make you feel dizzy. This can increase your chance of falling. Ask your doctor what other things that you can do to help prevent falls. This information is not intended to replace advice given to you by your health care provider. Make sure you discuss any questions you have with your health care provider. Document Released: 04/15/2009 Document Revised: 11/25/2015 Document Reviewed: 07/24/2014 Elsevier Interactive Patient Education  2017 [employer].   Steps to Quit Smoking  Smoking tobacco can be bad for your health. It can also affect almost every organ in your body. Smoking puts you and people around you at risk for many serious long-lasting (chronic) diseases. Quitting smoking is hard, but it is one of the best things that you can do for your health. It is never too late to quit. What are the benefits of quitting smoking? When you quit smoking, you lower your risk for getting serious diseases and conditions. They can include:  Lung cancer or lung disease.  Heart disease.  Stroke.  Heart attack.  Not being able to have children (infertility).  Weak bones (osteoporosis) and broken bones (fractures). If  you have coughing, wheezing, and shortness of breath, those symptoms may get better when you quit. You may also get sick less often. If you are pregnant, quitting smoking can help to lower your chances of having a baby of low  birth weight. What can I do to help me quit smoking? Talk with your doctor about what can help you quit smoking. Some things you can do (strategies) include:  Quitting smoking totally, instead of slowly cutting back how much you smoke over a period of time.  Going to in-person counseling. You are more likely to quit if you go to many counseling sessions.  Using resources and support systems, such as: ? Database administrator with a Social worker. ? Phone quitlines. ? Careers information officer. ? Support groups or group counseling. ? Text messaging programs. ? Mobile phone apps or applications.  Taking medicines. Some of these medicines may have nicotine in them. If you are pregnant or breastfeeding, do not take any medicines to quit smoking unless your doctor says it is okay. Talk with your doctor about counseling or other things that can help you. Talk with your doctor about using more than one strategy at the same time, such as taking medicines while you are also going to in-person counseling. This can help make quitting easier. What things can I do to make it easier to quit? Quitting smoking might feel very hard at first, but there is a lot that you can do to make it easier. Take these steps:  Talk to your family and friends. Ask them to support and encourage you.  Call phone quitlines, reach out to support groups, or work with a Social worker.  Ask people who smoke to not smoke around you.  Avoid places that make you want (trigger) to smoke, such as: ? Bars. ? Parties. ? Smoke-break areas at work.  Spend time with people who do not smoke.  Lower the stress in your life. Stress can make you want to smoke. Try these things to help your stress: ? Getting regular  exercise. ? Deep-breathing exercises. ? Yoga. ? Meditating. ? Doing a body scan. To do this, close your eyes, focus on one area of your body at a time from head to toe, and notice which parts of your body are tense. Try to relax the muscles in those areas.  Download or buy apps on your mobile phone or tablet that can help you stick to your quit plan. There are many free apps, such as QuitGuide from the [employer] Office manager for Disease Control and Prevention). You can find more support from smokefree.gov and other websites. This information is not intended to replace advice given to you by your health care provider. Make sure you discuss any questions you have with your health care provider. Document Released: 04/15/2009 Document Revised: 02/15/2016 Document Reviewed: 11/03/2014 Elsevier Interactive Patient Education  2019 [employer].

## 2018-07-11 NOTE — Progress Notes (Signed)
Subjective:   Robie Mcniel is a 63 y.o. male who presents for Medicare Annual/Subsequent preventive examination.  Review of Systems:  N/A Cardiac Risk Factors include: advanced age (>31men, >7 women);hypertension;male gender;obesity (BMI >30kg/m2);sedentary lifestyle;smoking/ tobacco exposure     Objective:    Vitals: BP 115/66   Pulse 80   Temp 97.8 F (36.6 C) (Oral)   Ht 5\' 6"  (1.676 m)   Wt (!) 329 lb 9.6 oz (149.5 kg)   SpO2 96%   BMI 53.20 kg/m   Body mass index is 53.2 kg/m.  Advanced Directives 07/11/2018 07/27/2017 07/06/2017 05/23/2016 05/18/2016 05/01/2016  Does Patient Have a Medical Advance Directive? No No No No No No  Does patient want to make changes to medical advance directive? - No - Patient declined Yes (MAU/Ambulatory/Procedural Areas - Information given) - - -  Would patient like information on creating a medical advance directive? Yes (MAU/Ambulatory/Procedural Areas - Information given) No - Patient declined - No - Patient declined - No - patient declined information    Tobacco Social History   Tobacco Use  Smoking Status Current Every Day Smoker  . Packs/day: 0.50  . Years: 46.00  . Pack years: 23.00  . Types: Cigarettes  . Start date: 07/16/2017  Smokeless Tobacco Never Used  Tobacco Comment   10 cigarettes a day     Ready to quit: Yes Counseling given: Yes Comment: 10 cigarettes a day   Clinical Intake:  Pre-visit preparation completed: Yes  Pain : No/denies pain     Nutritional Status: BMI > 30  Obese Nutritional Risks: None  How often do you need to have someone help you when you read instructions, pamphlets, or other written materials from your doctor or pharmacy?: 1 - Never What is the last grade level you completed in school?: 4 year degree   Interpreter Needed?: No  Information entered by :: Stormy Fabian, LPN  Past Medical History:  Diagnosis Date  . Bicuspid aortic valve    a. s/p #27 Carbomedics mechanical  valve on 03/25/2010; b. on Coumadin; c. TTE 12/17: EF 40-45%, moderately dilated LV with moderate LVH, AVR well-seated with 14 mmHg gradient, peak AV velocity 2.5 m/s, mild mitral valve thickening with mild MR, mildly dilated RV with mildly reduced contraction  . Cellulitis   . Chronic systolic CHF (congestive heart failure) ([place])    a. R/LHC 03/2010 showed no significant CAD, LVEDP 31 mmHg, mean AoV gradient 34 mmHg at rest and 47 mmHg with dobutamine 20 mcg/kg/min, AVA 1.0 cm^2, RA 31, RV 68/25, PA 68/47, PCWP 38. PA sat 65%. CO 6.2 L/min ([place]) and 5.3 L/min (thermodilution)  . Clotting disorder ([place])   . H/O mechanical aortic valve replacement 03/25/2010   a. #27 Carbomedics mechanical valve  . Hypercholesterolemia   . Renal infarct [facility]) 2017   Multiple right renal infarcts, likely embolic.  . Stroke ([place])   . TIA (transient ischemic attack) 05/2014   Past Surgical History:  Procedure Laterality Date  . AORTIC VALVE REPLACEMENT    . CARDIAC CATHETERIZATION  03/21/2010   No significant CAD. Severe aortic stenosis. Severely elevated left and right heart filling pressures.  Marland Kitchen CARDIAC SURGERY  2009   CHF  . CARPAL TUNNEL RELEASE Left 2005  . TONSILLECTOMY  1962   Family History  Problem Relation Age of Onset  . Arthritis Mother   . Dementia Mother   . Colon cancer Mother   . Arthritis Father   . Diabetes Father   .  Stroke Father   . Colon cancer Father   . Heart attack Brother   . Breast cancer Sister   . Seizures Sister   . Cancer Brother        brain  . Heart disease Brother   . Heart attack Brother    Social History   Socioeconomic History  . Marital status: Single    Spouse name: Not on file  . Number of children: 1  . Years of education: 4  . Highest education level: Associate degree: academic program  Occupational History  . Occupation: Disabled    Fish farm manager: UNEMPLOYED  Social Needs  . Financial resource strain: Not very hard  . Food insecurity:    Worry:  Never true    Inability: Never true  . Transportation needs:    Medical: No    Non-medical: No  Tobacco Use  . Smoking status: Current Every Day Smoker    Packs/day: 0.50    Years: 46.00    Pack years: 23.00    Types: Cigarettes    Start date: 07/16/2017  . Smokeless tobacco: Never Used  . Tobacco comment: 10 cigarettes a day  Substance and Sexual Activity  . Alcohol use: No    Alcohol/week: 0.0 standard drinks  . Drug use: No  . Sexual activity: Not Currently  Lifestyle  . Physical activity:    Days per week: 0 days    Minutes per session: 0 min  . Stress: Not at all  Relationships  . Social connections:    Talks on phone: Never    Gets together: Never    Attends religious service: Never    Active member of club or organization: No    Attends meetings of clubs or organizations: Never    Relationship status: Married  Other Topics Concern  . Not on file  Social History Narrative  . Not on file    Outpatient Encounter Medications as of 07/11/2018  Medication Sig  . albuterol (PROVENTIL HFA;VENTOLIN HFA) 108 (90 Base) MCG/ACT inhaler Inhale 2 puffs into the lungs every 6 (six) hours as needed for wheezing or shortness of breath.  Marland Kitchen albuterol (PROVENTIL) (2.5 MG/3ML) 0.083% nebulizer solution Take 3 mLs (2.5 mg total) by nebulization every 6 (six) hours as needed for wheezing or shortness of breath.  . allopurinol (ZYLOPRIM) 100 MG tablet Take 1 tablet (100 mg total) by mouth daily.  Marland Kitchen aspirin 81 MG chewable tablet Chew 81 mg by mouth daily.    . cyclobenzaprine (FLEXERIL) 10 MG tablet TAKE 1 TABLET BY MOUTH TWICE DAILY AS NEEDED FOR MUSCLE SPASM  . Fluticasone-Salmeterol (ADVAIR) 250-50 MCG/DOSE AEPB Inhale 1 puff into the lungs 2 (two) times daily as needed.  . furosemide (LASIX) 40 MG tablet Take 1 tablet (40 mg total) by mouth daily. (Patient taking differently: Take 40 mg by mouth daily as needed. )  . lisinopril (PRINIVIL,ZESTRIL) 5 MG tablet Take 1 tablet (5 mg total)  by mouth daily.  . metoprolol succinate (TOPROL-XL) 100 MG 24 hr tablet TAKE 1 TABLET BY MOUTH ONCE DAILY WITH  OR  IMMEDIATELY  FOLLOWING  A  MEAL  . simvastatin (ZOCOR) 40 MG tablet Take 1 tablet (40 mg total) by mouth at bedtime.  Marland Kitchen spironolactone (ALDACTONE) 25 MG tablet Take 0.5 tablets (12.5 mg total) by mouth daily.  Marland Kitchen tiotropium (SPIRIVA) 18 MCG inhalation capsule Place 18 mcg into inhaler and inhale daily as needed.  . VENTOLIN HFA 108 (90 Base) MCG/ACT inhaler INHALE 2 PUFFS BY  MOUTH EVERY 6 HOURS AS NEEDED FOR WHEEZING AND OR SHORTNESS OF BREATH  . warfarin (COUMADIN) 4 MG tablet Take 1 tablet (4 mg total) by mouth daily at 6 PM. Take as directed  . warfarin (COUMADIN) 7.5 MG tablet Take as directed   No facility-administered encounter medications on file as of 07/11/2018.     Activities of Daily Living In your present state of health, do you have any difficulty performing the following activities: 07/11/2018  Hearing? Y  Comment patient declines referral to audiologist at this time  Vision? N  Difficulty concentrating or making decisions? Y  Comment occasional forgetfulness  Walking or climbing stairs? N  Dressing or bathing? N  Doing errands, shopping? N  Preparing Food and eating ? N  Using the Toilet? N  In the past six months, have you accidently leaked urine? N  Do you have problems with loss of bowel control? N  Managing your Medications? Y  Comment patient's friend prepares medication in a pill box and patient will take them independently   Managing your Finances? N  Housekeeping or managing your Housekeeping? N  Some recent data might be hidden    Patient Care Team: Valerie Roys, DO as PCP - General (Family Medicine)   Assessment:   This is a routine wellness examination for [name].  Exercise Activities and Dietary recommendations Current Exercise Habits: The patient does not participate in regular exercise at present, Exercise limited by: None  identified  Goals    . DIET - INCREASE WATER INTAKE     Recommend drinking at least 6-8 glasses of water a day     . Exercise 3x per week (30 min per time)     Recommend starting a routine exercise program at least 3 days a week for 30-45 minutes at a time as tolerated.         Fall Risk Fall Risk  07/11/2018 04/03/2018 07/06/2017 05/17/2017 06/30/2016  Falls in the past year? 0 No No No No  Number falls in past yr: 0 - - - -   Is the patient's home free of loose throw rugs in walkways, pet beds, electrical cords, etc?   yes      Grab bars in the bathroom? no      Handrails on the stairs?   yes      Adequate lighting?   yes  Timed Get Up and Go Performed: Completed in 10 seconds with no use of assistive devices, steady gait. No intervention needed.  Depression Screen PHQ 2/9 Scores 07/11/2018 07/06/2017 05/17/2017 01/25/2017  PHQ - 2 Score 1 2 0 1  PHQ- 9 Score - 2 - 2    Cognitive Function     6CIT Screen 07/11/2018 07/06/2017  What Year? 0 points 0 points  What month? 0 points 0 points  What time? 0 points 0 points  Count back from 20 0 points 0 points  Months in reverse 0 points 0 points  Repeat phrase 0 points 0 points  Total Score 0 0    Immunization History  Administered Date(s) Administered  . Influenza,inj,Quad PF,6+ Mos 04/19/2016, 05/17/2017, 04/03/2018  . Pneumococcal Polysaccharide-23 06/09/2014  . Tdap 06/09/2014    Qualifies for Shingles Vaccine? Discussed options, declines  Screening Tests Health Maintenance  Topic Date Due  . HIV Screening  08/25/1969  . COLONOSCOPY  08/25/2004  . FOOT EXAM  04/19/2017  . OPHTHALMOLOGY EXAM  06/01/2017  . HEMOGLOBIN A1C  10/03/2018  . TETANUS/TDAP  06/09/2024  .  INFLUENZA VACCINE  Completed  . PNEUMOCOCCAL POLYSACCHARIDE VACCINE AGE 23-64 HIGH RISK  Completed  . Hepatitis C Screening  Completed   Cancer Screenings: Lung: Low Dose CT Chest recommended if Age 53-80 years, 30 pack-year currently smoking OR have quit  w/in 15years. Patient does not qualify, completed in 2017. Colorectal: Declines  Additional Screenings:  Hepatitis C Screening:Completed 02/09/2015      Plan:     I have personally reviewed and noted the following in the patient's chart:   . Medical and social history . Use of alcohol, tobacco or illicit drugs  . Current medications and supplements . Functional ability and status . Nutritional status . Physical activity . Advanced directives . List of other physicians . Hospitalizations, surgeries, and ER visits in previous 12 months . Vitals . Screenings to include cognitive, depression, and falls . Referrals and appointments  In addition, I have reviewed and discussed with patient certain preventive protocols, quality metrics, and best practice recommendations. A written personalized care plan for preventive services as well as general preventive health recommendations were provided to patient.     Stormy Fabian, LPN  01/05/1517

## 2018-07-19 ENCOUNTER — Ambulatory Visit: Payer: Medicare Other | Admitting: Family Medicine

## 2018-07-25 ENCOUNTER — Encounter: Payer: Self-pay | Admitting: Family Medicine

## 2018-07-25 ENCOUNTER — Ambulatory Visit (INDEPENDENT_AMBULATORY_CARE_PROVIDER_SITE_OTHER): Payer: Medicare Other | Admitting: Family Medicine

## 2018-07-25 VITALS — BP 106/72 | HR 74 | Temp 98.1°F | Ht 65.0 in | Wt 330.8 lb

## 2018-07-25 DIAGNOSIS — E119 Type 2 diabetes mellitus without complications: Secondary | ICD-10-CM | POA: Diagnosis not present

## 2018-07-25 DIAGNOSIS — J441 Chronic obstructive pulmonary disease with (acute) exacerbation: Secondary | ICD-10-CM

## 2018-07-25 DIAGNOSIS — Z952 Presence of prosthetic heart valve: Secondary | ICD-10-CM

## 2018-07-25 DIAGNOSIS — I10 Essential (primary) hypertension: Secondary | ICD-10-CM | POA: Diagnosis not present

## 2018-07-25 DIAGNOSIS — E785 Hyperlipidemia, unspecified: Secondary | ICD-10-CM

## 2018-07-25 LAB — UA/M W/RFLX CULTURE, ROUTINE
Bilirubin, UA: NEGATIVE
Glucose, UA: NEGATIVE
Ketones, UA: NEGATIVE
Leukocytes, UA: NEGATIVE
Nitrite, UA: NEGATIVE
Protein, UA: NEGATIVE
RBC, UA: NEGATIVE
Specific Gravity, UA: 1.025 (ref 1.005–1.030)
Urobilinogen, Ur: 0.2 mg/dL (ref 0.2–1.0)
pH, UA: 5.5 (ref 5.0–7.5)

## 2018-07-25 LAB — COAGUCHEK XS/INR WAIVED
INR: 3.5 — ABNORMAL HIGH (ref 0.9–1.1)
Prothrombin Time: 42.4 s

## 2018-07-25 LAB — BAYER DCA HB A1C WAIVED: HB A1C: 6 % (ref <7.0)

## 2018-07-25 NOTE — Assessment & Plan Note (Signed)
Under good control on current regimen. Continue current regimen. Continue to monitor. Call with any concerns. Refills given. Labs drawn today.   

## 2018-07-25 NOTE — Assessment & Plan Note (Signed)
Stable with A1c of 6.0. Continue diet and exercise. Call with any concerns. Recheck 6 months.

## 2018-07-25 NOTE — Assessment & Plan Note (Signed)
Doing well with INR ot 3.5- continue current dose and recheck 1 month.

## 2018-07-25 NOTE — Progress Notes (Signed)
BP 106/72 (BP Location: Left Arm, Patient Position: Sitting, Cuff Size: Large)   Pulse 74   Temp 98.1 F (36.7 C) (Oral)   Ht 5\' 5"  (1.651 m)   Wt (!) 330 lb 12.8 oz (150 kg)   SpO2 97%   BMI 55.05 kg/m    Subjective:    Patient ID: Roberto [name], male    DOB: October 24, 1955, 63 y.o.   MRN: [id number]  HPI: Roberto [name] is a 63 y.o. male  Chief Complaint  Patient presents with  . Coagulation Disorder    6 week F/U   Coumadin Management.  The expected duration of coumadin treatment is lifelong The reason for anticoagulation is  mechanical heart valve.  Present Coumadin dose: 4,4,7mg  alternating Goal: 2.5-3.5  Excessive bruising: no Nose bleeding: no Rectal bleeding: no Prolonged menstrual cycles: N/A Eating diet with consistent amounts of foods containing Vitamin K:yes Any recent antibiotic use? no  HYPERTENSION / HYPERLIPIDEMIA Satisfied with current treatment? yes Duration of hypertension: chronic BP monitoring frequency: not checking BP medication side effects: no Past BP meds: lisinopril, metoprolol, spironalactone, lasix prn Duration of hyperlipidemia: chronic Cholesterol medication side effects: no Cholesterol supplements: none Past cholesterol medications: simvastatin Medication compliance: excellent compliance Aspirin: yes Recent stressors: no Recurrent headaches: no Visual changes: no Palpitations: no Dyspnea: no Chest pain: no Lower extremity edema: no Dizzy/lightheaded: no  DIABETES Hypoglycemic episodes:no Polydipsia/polyuria: no Visual disturbance: no Chest pain: no Paresthesias: no Glucose Monitoring: no  Accucheck frequency: Not Checking Taking Insulin?: no Blood Pressure Monitoring: not checking Retinal Examination: Not up to Date Foot Exam: Up to Date Diabetic Education: Completed Pneumovax: Up to Date Influenza: Up to Date Aspirin: yes  Relevant past medical, surgical, family and social history reviewed and updated as  indicated. Interim medical history since our last visit reviewed. Allergies and medications reviewed and updated.  Review of Systems  Constitutional: Negative.   Respiratory: Negative.   Cardiovascular: Negative.   Musculoskeletal: Negative.   Neurological: Negative.   Psychiatric/Behavioral: Negative.     Per HPI unless specifically indicated above     Objective:    BP 106/72 (BP Location: Left Arm, Patient Position: Sitting, Cuff Size: Large)   Pulse 74   Temp 98.1 F (36.7 C) (Oral)   Ht 5\' 5"  (1.651 m)   Wt (!) 330 lb 12.8 oz (150 kg)   SpO2 97%   BMI 55.05 kg/m   Wt Readings from Last 3 Encounters:  07/25/18 (!) 330 lb 12.8 oz (150 kg)  07/11/18 (!) 329 lb 9.6 oz (149.5 kg)  06/06/18 (!) 331 lb (150.1 kg)    Physical Exam Vitals signs and nursing note reviewed.  Constitutional:      General: He is not in acute distress.    Appearance: Normal appearance. He is not ill-appearing, toxic-appearing or diaphoretic.  HENT:     Head: Normocephalic and atraumatic.     Right Ear: External ear normal.     Left Ear: External ear normal.     Nose: Nose normal.     Mouth/Throat:     Mouth: Mucous membranes are moist.     Pharynx: Oropharynx is clear.  Eyes:     General: No scleral icterus.       Right eye: No discharge.        Left eye: No discharge.     Extraocular Movements: Extraocular movements intact.     Conjunctiva/sclera: Conjunctivae normal.     Pupils: Pupils are equal, round, and reactive to light.  Neck:     Musculoskeletal: Normal range of motion and neck supple.  Cardiovascular:     Rate and Rhythm: Normal rate and regular rhythm.     Pulses: Normal pulses.     Heart sounds: Normal heart sounds. No murmur. No friction rub. No gallop.   Pulmonary:     Effort: Pulmonary effort is normal. No respiratory distress.     Breath sounds: Normal breath sounds. No stridor. No wheezing, rhonchi or rales.  Chest:     Chest wall: No tenderness.  Musculoskeletal:  Normal range of motion.  Skin:    General: Skin is warm and dry.     Capillary Refill: Capillary refill takes less than 2 seconds.     Coloration: Skin is not jaundiced or pale.     Findings: No bruising, erythema, lesion or rash.  Neurological:     General: No focal deficit present.     Mental Status: He is alert and oriented to person, place, and time. Mental status is at baseline.  Psychiatric:        Mood and Affect: Mood normal.        Behavior: Behavior normal.        Thought Content: Thought content normal.        Judgment: Judgment normal.     Results for orders placed or performed in visit on 06/06/18  CoaguChek XS/INR Waived  Result Value Ref Range   INR 2.9 (H) 0.9 - 1.1   Prothrombin Time 34.6 sec      Assessment & Plan:   Problem List Items Addressed This Visit      Cardiovascular and Mediastinum   Essential hypertension - Primary    Under good control on current regimen. Continue current regimen. Continue to monitor. Call with any concerns. Refills given. Labs drawn today.       Relevant Orders   Comprehensive metabolic panel   TSH   UA/M w/rflx Culture, Routine     Respiratory   COPD (chronic obstructive pulmonary disease) (HCC)   Relevant Orders   CBC with Differential/Platelet   Comprehensive metabolic panel   TSH   UA/M w/rflx Culture, Routine     Endocrine   Diabetes mellitus type 2, diet-controlled ([place])    Stable with A1c of 6.0. Continue diet and exercise. Call with any concerns. Recheck 6 months.       Relevant Orders   Bayer DCA Hb A1c Waived   Comprehensive metabolic panel   TSH   UA/M w/rflx Culture, Routine     Other   H/O mechanical aortic valve replacement    Doing well with INR ot 3.5- continue current dose and recheck 1 month.       Relevant Orders   Comprehensive metabolic panel   CoaguChek XS/INR Waived   TSH   UA/M w/rflx Culture, Routine   Hyperlipidemia    Under good control on current regimen. Continue current  regimen. Continue to monitor. Call with any concerns. Refills given. Labs drawn today.       Relevant Orders   Comprehensive metabolic panel   Lipid Panel w/o Chol/HDL Ratio   TSH   UA/M w/rflx Culture, Routine       Follow up plan: Return in about 4 weeks (around 08/22/2018) for INR.

## 2018-07-26 ENCOUNTER — Encounter: Payer: Self-pay | Admitting: Family Medicine

## 2018-07-26 LAB — LIPID PANEL W/O CHOL/HDL RATIO
Cholesterol, Total: 202 mg/dL — ABNORMAL HIGH (ref 100–199)
HDL: 41 mg/dL (ref >39)
LDL Calculated: 107 mg/dL — ABNORMAL HIGH (ref 0–99)
Triglycerides: 271 mg/dL — ABNORMAL HIGH (ref 0–149)
VLDL Cholesterol Cal: 54 mg/dL — ABNORMAL HIGH (ref 5–40)

## 2018-07-26 LAB — TSH: TSH: 1.52 u[IU]/mL (ref 0.450–4.500)

## 2018-07-26 LAB — CBC WITH DIFFERENTIAL/PLATELET
BASOS ABS: 0.1 10*3/uL (ref 0.0–0.2)
Basos: 1 %
EOS (ABSOLUTE): 0.2 10*3/uL (ref 0.0–0.4)
EOS: 3 %
HEMOGLOBIN: 15.3 g/dL (ref 13.0–17.7)
Hematocrit: 45.2 % (ref 37.5–51.0)
IMMATURE GRANULOCYTES: 0 %
Immature Grans (Abs): 0 10*3/uL (ref 0.0–0.1)
LYMPHS: 34 %
Lymphocytes Absolute: 2.4 10*3/uL (ref 0.7–3.1)
MCH: 29.2 pg (ref 26.6–33.0)
MCHC: 33.8 g/dL (ref 31.5–35.7)
MCV: 86 fL (ref 79–97)
MONOCYTES: 10 %
Monocytes Absolute: 0.7 10*3/uL (ref 0.1–0.9)
NEUTROS PCT: 52 %
Neutrophils Absolute: 3.6 10*3/uL (ref 1.4–7.0)
Platelets: 254 10*3/uL (ref 150–450)
RBC: 5.24 x10E6/uL (ref 4.14–5.80)
RDW: 12.5 % (ref 11.6–15.4)
WBC: 7 10*3/uL (ref 3.4–10.8)

## 2018-07-26 LAB — COMPREHENSIVE METABOLIC PANEL
ALT: 14 IU/L (ref 0–44)
AST: 19 IU/L (ref 0–40)
Albumin/Globulin Ratio: 1.2 (ref 1.2–2.2)
Albumin: 3.7 g/dL — ABNORMAL LOW (ref 3.8–4.8)
Alkaline Phosphatase: 52 IU/L (ref 39–117)
BUN/Creatinine Ratio: 19 (ref 10–24)
BUN: 21 mg/dL (ref 8–27)
Bilirubin Total: 0.2 mg/dL (ref 0.0–1.2)
CALCIUM: 9.1 mg/dL (ref 8.6–10.2)
CO2: 19 mmol/L — ABNORMAL LOW (ref 20–29)
CREATININE: 1.09 mg/dL (ref 0.76–1.27)
Chloride: 104 mmol/L (ref 96–106)
GFR calc Af Amer: 83 mL/min/{1.73_m2} (ref >59)
GFR, EST NON AFRICAN AMERICAN: 72 mL/min/{1.73_m2} (ref >59)
GLUCOSE: 94 mg/dL (ref 65–99)
Globulin, Total: 3.2 g/dL (ref 1.5–4.5)
Potassium: 4.7 mmol/L (ref 3.5–5.2)
Sodium: 141 mmol/L (ref 134–144)
Total Protein: 6.9 g/dL (ref 6.0–8.5)

## 2018-08-14 ENCOUNTER — Telehealth: Payer: Self-pay | Admitting: Internal Medicine

## 2018-08-14 ENCOUNTER — Encounter: Payer: Self-pay | Admitting: Internal Medicine

## 2018-08-14 NOTE — Telephone Encounter (Signed)
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

## 2018-08-14 NOTE — Telephone Encounter (Signed)
Letter printed, signed by Dr End and faxed to number provided.

## 2018-08-14 NOTE — Telephone Encounter (Signed)
Spoke with patient's friend, ok per DPR. 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 Advised I will make Dr End aware if he is ok to do a letter. They really need this today. She has already left patient in [place] at orientation at this [facility].

## 2018-08-14 NOTE — Telephone Encounter (Signed)
Roberto [name] states that patient is currently at [employer] with Dr. Konrad Penta in [place] States that patient was just recently cleared but due to switching to a new company they are requesting new clearance Needs letter faxed ASAP - [phone]

## 2018-08-14 NOTE — Telephone Encounter (Signed)
I spoke with Vangie Bicker and have drafted a letter describing the patient's cardiac status.  We will fax it to the number indicated below.  Nelva Bush, MD [facility] Pager: [phone]

## 2018-08-26 ENCOUNTER — Ambulatory Visit: Payer: Medicare Other | Admitting: Family Medicine

## 2018-09-02 ENCOUNTER — Ambulatory Visit: Payer: Medicare Other | Admitting: Family Medicine

## 2018-09-09 ENCOUNTER — Ambulatory Visit: Payer: Medicare Other | Admitting: Family Medicine

## 2018-09-09 NOTE — Progress Notes (Deleted)
There were no vitals taken for this visit.   Subjective:    Patient ID: Roberto [name], male    DOB: [birth year], 63 y.o.   MRN: [id number]  HPI: Roberto [name] is a 63 y.o. male  No chief complaint on file.   Relevant past medical, surgical, family and social history reviewed and updated as indicated. Interim medical history since our last visit reviewed. Allergies and medications reviewed and updated.  Review of Systems  Per HPI unless specifically indicated above     Objective:    There were no vitals taken for this visit.  Wt Readings from Last 3 Encounters:  07/25/18 (!) 330 lb 12.8 oz (150 kg)  07/11/18 (!) 329 lb 9.6 oz (149.5 kg)  06/06/18 (!) 331 lb (150.1 kg)    Physical Exam  Results for orders placed or performed in visit on 07/25/18  CBC with Differential/Platelet  Result Value Ref Range   WBC 7.0 3.4 - 10.8 x10E3/uL   RBC 5.24 4.14 - 5.80 x10E6/uL   Hemoglobin 15.3 13.0 - 17.7 g/dL   Hematocrit 45.2 37.5 - 51.0 %   MCV 86 79 - 97 fL   MCH 29.2 26.6 - 33.0 pg   MCHC 33.8 31.5 - 35.7 g/dL   RDW 12.5 11.6 - 15.4 %   Platelets 254 150 - 450 x10E3/uL   Neutrophils 52 Not Estab. %   Lymphs 34 Not Estab. %   Monocytes 10 Not Estab. %   Eos 3 Not Estab. %   Basos 1 Not Estab. %   Neutrophils Absolute 3.6 1.4 - 7.0 x10E3/uL   Lymphocytes Absolute 2.4 0.7 - 3.1 x10E3/uL   Monocytes Absolute 0.7 0.1 - 0.9 x10E3/uL   EOS (ABSOLUTE) 0.2 0.0 - 0.4 x10E3/uL   Basophils Absolute 0.1 0.0 - 0.2 x10E3/uL   Immature Granulocytes 0 Not Estab. %   Immature Grans (Abs) 0.0 0.0 - 0.1 x10E3/uL  Bayer DCA Hb A1c Waived  Result Value Ref Range   HB A1C (BAYER DCA - WAIVED) 6.0 <7.0 %  Comprehensive metabolic panel  Result Value Ref Range   Glucose 94 65 - 99 mg/dL   BUN 21 8 - 27 mg/dL   Creatinine, Ser 1.09 0.76 - 1.27 mg/dL   GFR calc non Af Amer 72 >59 mL/min/1.73   GFR calc Af Amer 83 >59 mL/min/1.73   BUN/Creatinine Ratio 19 10 - 24   Sodium 141 134 - 144  mmol/L   Potassium 4.7 3.5 - 5.2 mmol/L   Chloride 104 96 - 106 mmol/L   CO2 19 (L) 20 - 29 mmol/L   Calcium 9.1 8.6 - 10.2 mg/dL   Total Protein 6.9 6.0 - 8.5 g/dL   Albumin 3.7 (L) 3.8 - 4.8 g/dL   Globulin, Total 3.2 1.5 - 4.5 g/dL   Albumin/Globulin Ratio 1.2 1.2 - 2.2   Bilirubin Total 0.2 0.0 - 1.2 mg/dL   Alkaline Phosphatase 52 39 - 117 IU/L   AST 19 0 - 40 IU/L   ALT 14 0 - 44 IU/L  Lipid Panel w/o Chol/HDL Ratio  Result Value Ref Range   Cholesterol, Total 202 (H) 100 - 199 mg/dL   Triglycerides 271 (H) 0 - 149 mg/dL   HDL 41 >39 mg/dL   VLDL Cholesterol Cal 54 (H) 5 - 40 mg/dL   LDL Calculated 107 (H) 0 - 99 mg/dL  CoaguChek XS/INR Waived  Result Value Ref Range   INR 3.5 (H) 0.9 - 1.1   Prothrombin  Time 42.4 sec  TSH  Result Value Ref Range   TSH 1.520 0.450 - 4.500 uIU/mL  UA/M w/rflx Culture, Routine  Result Value Ref Range   Specific Gravity, UA 1.025 1.005 - 1.030   pH, UA 5.5 5.0 - 7.5   Color, UA Yellow Yellow   Appearance Ur Clear Clear   Leukocytes, UA Negative Negative   Protein, UA Negative Negative/Trace   Glucose, UA Negative Negative   Ketones, UA Negative Negative   RBC, UA Negative Negative   Bilirubin, UA Negative Negative   Urobilinogen, Ur 0.2 0.2 - 1.0 mg/dL   Nitrite, UA Negative Negative      Assessment & Plan:   Problem List Items Addressed This Visit      Other   H/O mechanical aortic valve replacement - Primary       Follow up plan: No follow-ups on file.

## 2018-09-10 ENCOUNTER — Encounter: Payer: Self-pay | Admitting: Nurse Practitioner

## 2018-09-10 ENCOUNTER — Ambulatory Visit (INDEPENDENT_AMBULATORY_CARE_PROVIDER_SITE_OTHER): Payer: Medicare Other | Admitting: Nurse Practitioner

## 2018-09-10 VITALS — BP 131/63 | HR 94 | Temp 98.5°F | Ht 65.0 in | Wt 330.2 lb

## 2018-09-10 DIAGNOSIS — Z952 Presence of prosthetic heart valve: Secondary | ICD-10-CM | POA: Diagnosis not present

## 2018-09-10 LAB — COAGUCHEK XS/INR WAIVED
INR: 2.5 — ABNORMAL HIGH (ref 0.9–1.1)
Prothrombin Time: 30.5 s

## 2018-09-10 NOTE — Patient Instructions (Signed)
Bleeding Precautions When on Anticoagulant Therapy, Adult Anticoagulant therapy, also called blood thinner therapy, is medicine that helps to prevent and treat blood clots. The medicine works by stopping blood clots from forming or growing. Blood clots that form in your blood vessels can be dangerous. They can break loose and travel to the heart, lungs, or brain. This increases the risk of a heart attack, stroke, or blocked lung artery (pulmonary embolism). Anticoagulants also increase the risk of bleeding. Try to protect yourself from cuts and other injuries that can cause bleeding. It is important to take anticoagulants exactly as told by your health care provider. Why do I need to be on anticoagulant therapy? You may need this medicine if you are at risk of developing a blood clot. Conditions that increase your risk of a blood clot include:  Being born with heart disease or a heart malformation (congenital heart disease).  Developing heart disease.  Having had surgery, such as valve replacement.  Having had a serious accident or other type of severe injury (trauma).  Having certain types of cancer.  Having certain diseases that can increase blood clotting.  Having a high risk of stroke or heart attack.  Having atrial fibrillation (AF). What are the common anticoagulant medicines? There are several types of anticoagulant medicines. The most common types are:  Medicines that you take by mouth (oral medicines), such as: ? Warfarin. ? Novel oral anticoagulants (NOACs), such as: ? Direct thrombin inhibitors (dabigatran). ? Factor Xa inhibitors (apixaban, edoxaban, and rivaroxaban).  Injections, such as: ? Unfractionated heparin. ? Low molecular weight heparin. These anticoagulants work in different ways to prevent blood clots. They also have different risks and side effects. What do I need to remember while on anticoagulant therapy? Taking anticoagulants  Take your medicine at the  same time every day. If you forget to take your medicine, take it as soon as you remember. Do not double your dosage of medicine if you miss a whole day. Take your normal dose and call your health care provider.  Do not stop taking your medicine unless your health care provider approves. Stopping the medicine can increase your risk of developing a blood clot. Taking other medicines  Take over-the-counter and prescriptions medicines only as told by your health care provider.  Do not take over-the-counter NSAIDs, including aspirin and ibuprofen, while you are on anticoagulant therapy. These medicines increase your risk of dangerous bleeding.  Get approval from your health care provider before you start taking any new medicines, vitamins, or herbal products. Some of these could interfere with your therapy. General instructions  Keep all follow-up visits as told by your health care provider. This is important.  If you are pregnant or trying to get pregnant, talk with a health care provider about anticoagulants. Some of these medicines are not safe to take during pregnancy.  Tell all health care providers, including your dentist, that you are on anticoagulant therapy. It is especially important to tell providers before you have any surgery, medical procedures, or dental work done. What precautions should I take?   Be very careful when using knives, scissors, or other sharp objects.  Use an electric razor instead of a blade.  Do not use toothpicks.  Use a soft-bristled toothbrush. Brush your teeth gently.  Always wear shoes outdoors and wear slippers indoors.  Be careful when cutting your fingernails and toenails.  Place bath mats in the bathroom. If possible, install handrails as well.  Wear gloves while you do   yard work.  Wear your seat belt.  Prevent falls by removing loose rugs and extension cords from areas where you walk. Use a cane or walker if you need it.  Avoid  constipation by: ? Drinking enough fluid to keep your urine clear or pale yellow. ? Eating foods that are high in fiber, such as fresh fruits and vegetables, whole grains, and beans. ? Limiting foods that are high in fat and processed sugars, such as fried and sweet foods.  Do not play contact sports or participate in other activities that have a high risk for injury. What other precautions are important if on warfarin therapy? If you are taking a type of anticoagulant called warfarin, make sure you:  Work with a diet and nutrition specialist (dietitian) to make an eating plan. Do not make any sudden changes to your diet after you have started your eating plan.  Do not drink alcohol. It can interfere with your medicine and increase your risk of an injury that causes bleeding.  Get regular blood tests as told by your health care provider. What are some questions to ask my health care provider?  Why do I need anticoagulant therapy?  What is the best anticoagulant therapy for my condition?  How long will I need anticoagulant therapy?  What are the side effects of anticoagulant therapy?  When should I take my medicine? What should I do if I forget to take it?  Will I need to have regular blood tests?  Do I need to change my diet? Are there foods or drinks that I should avoid?  What activities are safe for me?  What should I do if I want to get pregnant? Contact a health care provider if:  You miss a dose of medicine: ? And you are not sure what to do. ? For more than one day.  You have: ? Menstrual bleeding that is heavier than normal. ? Bloody or brown urine. ? Easy bruising. ? Black and tarry stool or bright red stool. ? Side effects from your medicine.  You feel weak or dizzy.  You become pregnant. Get help right away if:  You have bleeding that will not stop within 20 minutes from: ? The nose. ? The gums. ? A cut on the skin.  You have a severe headache or  stomachache.  You vomit or cough up blood.  You fall or hit your head. Summary  Anticoagulant therapy, also called blood thinner therapy, is medicine that helps to prevent and treat blood clots.  Anticoagulants work in different ways to prevent blood clots. They also have different risks and side effects.  Talk with your health care provider about any precautions that you should take while on anticoagulant therapy. This information is not intended to replace advice given to you by your health care provider. Make sure you discuss any questions you have with your health care provider. Document Released: 05/31/2015 Document Revised: 09/05/2016 Document Reviewed: 09/05/2016 Elsevier Interactive Patient Education  2019 [employer]  

## 2018-09-10 NOTE — Progress Notes (Signed)
BP 131/63 (BP Location: Left Arm, Patient Position: Sitting, Cuff Size: Large)   Pulse 94   Temp 98.5 F (36.9 C)   Ht 5\' 5"  (1.651 m)   Wt (!) 330 lb 4 oz (149.8 kg)   SpO2 96%   BMI 54.96 kg/m    Subjective:    Patient ID: Roberto [name], male    DOB: 01/14/1956, 63 y.o.   MRN: [id number]  CC: Coumadin management  HPI: This patient is a 63 y.o. male who presents for coumadin management. The expected duration of coumadin treatment is lifelong The reason for anticoagulation is  mechanical heart valve.  Present Coumadin dose: Has been on current schedule for long time: : 7.5, 4, 4, repeat Goal: 2.5-3.5 The patient does not have an active anticoagulation episode. Excessive bruising: no Nose bleeding: no Rectal bleeding: no Prolonged menstrual cycles: N/A Eating diet with consistent amounts of foods containing Vitamin K:has not been eating regular salads like normal does; reports if he came later in week INR would be in 3's Any recent antibiotic use? no  Relevant past medical, surgical, family and social history reviewed and updated as indicated. Interim medical history since our last visit reviewed. Allergies and medications reviewed and updated.  ROS: Per HPI unless specifically indicated above     Objective:    BP 131/63 (BP Location: Left Arm, Patient Position: Sitting, Cuff Size: Large)   Pulse 94   Temp 98.5 F (36.9 C)   Ht 5\' 5"  (1.651 m)   Wt (!) 330 lb 4 oz (149.8 kg)   SpO2 96%   BMI 54.96 kg/m   Wt Readings from Last 3 Encounters:  09/10/18 (!) 330 lb 4 oz (149.8 kg)  07/25/18 (!) 330 lb 12.8 oz (150 kg)  07/11/18 (!) 329 lb 9.6 oz (149.5 kg)     General: Well appearing, well nourished in no distress.  Normal mood and affect. Skin: No excessive bruising or rash  Last INR: 3.5    Last CBC: 07/25/2018 Lab Results  Component Value Date   WBC 7.0 07/25/2018   HGB 15.3 07/25/2018   HCT 45.2 07/25/2018   MCV 86 07/25/2018   PLT 254 07/25/2018     Results for orders placed or performed in visit on 07/25/18  CBC with Differential/Platelet  Result Value Ref Range   WBC 7.0 3.4 - 10.8 x10E3/uL   RBC 5.24 4.14 - 5.80 x10E6/uL   Hemoglobin 15.3 13.0 - 17.7 g/dL   Hematocrit 45.2 37.5 - 51.0 %   MCV 86 79 - 97 fL   MCH 29.2 26.6 - 33.0 pg   MCHC 33.8 31.5 - 35.7 g/dL   RDW 12.5 11.6 - 15.4 %   Platelets 254 150 - 450 x10E3/uL   Neutrophils 52 Not Estab. %   Lymphs 34 Not Estab. %   Monocytes 10 Not Estab. %   Eos 3 Not Estab. %   Basos 1 Not Estab. %   Neutrophils Absolute 3.6 1.4 - 7.0 x10E3/uL   Lymphocytes Absolute 2.4 0.7 - 3.1 x10E3/uL   Monocytes Absolute 0.7 0.1 - 0.9 x10E3/uL   EOS (ABSOLUTE) 0.2 0.0 - 0.4 x10E3/uL   Basophils Absolute 0.1 0.0 - 0.2 x10E3/uL   Immature Granulocytes 0 Not Estab. %   Immature Grans (Abs) 0.0 0.0 - 0.1 x10E3/uL  Bayer DCA Hb A1c Waived  Result Value Ref Range   HB A1C (BAYER DCA - WAIVED) 6.0 <7.0 %  Comprehensive metabolic panel  Result Value Ref Range  Glucose 94 65 - 99 mg/dL   BUN 21 8 - 27 mg/dL   Creatinine, Ser 1.09 0.76 - 1.27 mg/dL   GFR calc non Af Amer 72 >59 mL/min/1.73   GFR calc Af Amer 83 >59 mL/min/1.73   BUN/Creatinine Ratio 19 10 - 24   Sodium 141 134 - 144 mmol/L   Potassium 4.7 3.5 - 5.2 mmol/L   Chloride 104 96 - 106 mmol/L   CO2 19 (L) 20 - 29 mmol/L   Calcium 9.1 8.6 - 10.2 mg/dL   Total Protein 6.9 6.0 - 8.5 g/dL   Albumin 3.7 (L) 3.8 - 4.8 g/dL   Globulin, Total 3.2 1.5 - 4.5 g/dL   Albumin/Globulin Ratio 1.2 1.2 - 2.2   Bilirubin Total 0.2 0.0 - 1.2 mg/dL   Alkaline Phosphatase 52 39 - 117 IU/L   AST 19 0 - 40 IU/L   ALT 14 0 - 44 IU/L  Lipid Panel w/o Chol/HDL Ratio  Result Value Ref Range   Cholesterol, Total 202 (H) 100 - 199 mg/dL   Triglycerides 271 (H) 0 - 149 mg/dL   HDL 41 >39 mg/dL   VLDL Cholesterol Cal 54 (H) 5 - 40 mg/dL   LDL Calculated 107 (H) 0 - 99 mg/dL  CoaguChek XS/INR Waived  Result Value Ref Range   INR 3.5 (H) 0.9 -  1.1   Prothrombin Time 42.4 sec  TSH  Result Value Ref Range   TSH 1.520 0.450 - 4.500 uIU/mL  UA/M w/rflx Culture, Routine  Result Value Ref Range   Specific Gravity, UA 1.025 1.005 - 1.030   pH, UA 5.5 5.0 - 7.5   Color, UA Yellow Yellow   Appearance Ur Clear Clear   Leukocytes, UA Negative Negative   Protein, UA Negative Negative/Trace   Glucose, UA Negative Negative   Ketones, UA Negative Negative   RBC, UA Negative Negative   Bilirubin, UA Negative Negative   Urobilinogen, Ur 0.2 0.2 - 1.0 mg/dL   Nitrite, UA Negative Negative       Assessment:     ICD-10-CM   1. H/O mechanical aortic valve replacement Z95.2 CoaguChek XS/INR Waived    Plan:   Discussed current plan face-to-face with patient. For coumadin dosing, elected to continue current dose. Will plan to recheck INR in 1 month.  Current dose regimen: : 7.5, 4, 4, repeat.

## 2018-09-16 ENCOUNTER — Telehealth: Payer: Self-pay | Admitting: Family Medicine

## 2018-09-16 ENCOUNTER — Encounter: Payer: Self-pay | Admitting: Family Medicine

## 2018-09-16 ENCOUNTER — Ambulatory Visit (INDEPENDENT_AMBULATORY_CARE_PROVIDER_SITE_OTHER): Payer: Medicare Other | Admitting: Family Medicine

## 2018-09-16 ENCOUNTER — Other Ambulatory Visit: Payer: Self-pay

## 2018-09-16 VITALS — BP 110/75 | HR 87 | Temp 98.3°F

## 2018-09-16 DIAGNOSIS — J069 Acute upper respiratory infection, unspecified: Secondary | ICD-10-CM | POA: Diagnosis not present

## 2018-09-16 LAB — VERITOR FLU A/B WAIVED
Influenza A: NEGATIVE
Influenza B: NEGATIVE

## 2018-09-16 NOTE — Telephone Encounter (Signed)
I will need to see them both using COVID protocol of them going into the driveway behind the building 

## 2018-09-16 NOTE — Telephone Encounter (Signed)
Patient was seen today by Dr Wynetta Emery

## 2018-09-16 NOTE — Telephone Encounter (Signed)
Copied from [place] [id number]. Topic: General - Inquiry >> Sep 16, 2018  9:34 AM Alanda Slim E wrote: Reason for CRM: Carmelina Dane called in and stated that the Pt is getting the same thing that she was diagnosed with last week and can something be called in for him or does he need to be seen. Pt has an artificial heart vale(can not afford to get any kind of infection) Pt is experiencing cough with congestion, body aches and he is taking his inhaler. She started him on coricidin cold and flu yesterday/ please advise

## 2018-09-16 NOTE — Progress Notes (Signed)
BP 110/75   Pulse 87   Temp 98.3 F (36.8 C) (Oral)   SpO2 96%    Subjective:    Patient ID: Roberto [name], male    DOB: 1956-01-03, 63 y.o.   MRN: [id number]  HPI: Roberto [name] is a 63 y.o. male  Chief Complaint  Patient presents with  . URI   UPPER RESPIRATORY TRACT INFECTION- works as a Loss adjuster, chartered. Unknown exposures. Has been around a lot of people. No travel outside the country.  Duration: 2 days Worst symptom: cough Fever: yes Cough: yes Shortness of breath: yes Wheezing: no Chest pain: no Chest tightness: yes Chest congestion: yes Nasal congestion: yes Runny nose: yes Post nasal drip: yes Sneezing: no Sore throat: no Swollen glands: no Sinus pressure: no Headache: yes Face pain: no Toothache: no Ear pain: no  Ear pressure: no  Eyes red/itching:no Eye drainage/crusting: no  Vomiting: no Rash: no Fatigue: yes Sick contacts: yes Strep contacts: no  Context: worse Recurrent sinusitis: no Relief with OTC cold/cough medications: no  Treatments attempted: cold/sinus, mucinex and anti-histamine   Relevant past medical, surgical, family and social history reviewed and updated as indicated. Interim medical history since our last visit reviewed. Allergies and medications reviewed and updated.  Review of Systems  Constitutional: Positive for chills, fatigue and fever. Negative for activity change, appetite change, diaphoresis and unexpected weight change.  HENT: Positive for congestion, postnasal drip and rhinorrhea. Negative for dental problem, drooling, ear discharge, ear pain, facial swelling, hearing loss, mouth sores, nosebleeds, sinus pressure, sinus pain, sneezing, sore throat, tinnitus, trouble swallowing and voice change.   Eyes: Negative.   Respiratory: Positive for cough, chest tightness and shortness of breath. Negative for apnea, choking, wheezing and stridor.   Cardiovascular: Negative.   Gastrointestinal: Negative.    Genitourinary: Negative.   Musculoskeletal: Positive for myalgias. Negative for arthralgias, back pain, gait problem, joint swelling, neck pain and neck stiffness.  Skin: Negative.   Neurological: Negative.   Psychiatric/Behavioral: Negative.     Per HPI unless specifically indicated above     Objective:    BP 110/75   Pulse 87   Temp 98.3 F (36.8 C) (Oral)   SpO2 96%   Wt Readings from Last 3 Encounters:  09/10/18 (!) 330 lb 4 oz (149.8 kg)  07/25/18 (!) 330 lb 12.8 oz (150 kg)  07/11/18 (!) 329 lb 9.6 oz (149.5 kg)    Physical Exam Vitals signs and nursing note reviewed.  Constitutional:      General: He is not in acute distress.    Appearance: Normal appearance. He is not ill-appearing, toxic-appearing or diaphoretic.  HENT:     Head: Normocephalic and atraumatic.     Right Ear: Tympanic membrane, ear canal and external ear normal. There is no impacted cerumen.     Left Ear: Tympanic membrane, ear canal and external ear normal. There is no impacted cerumen.     Nose: Nose normal. No congestion or rhinorrhea.     Mouth/Throat:     Mouth: Mucous membranes are moist.     Pharynx: Oropharynx is clear. No oropharyngeal exudate or posterior oropharyngeal erythema.  Eyes:     General: No scleral icterus.       Right eye: No discharge.        Left eye: No discharge.     Extraocular Movements: Extraocular movements intact.     Conjunctiva/sclera: Conjunctivae normal.     Pupils: Pupils are equal, round, and reactive to light.  Neck:     Musculoskeletal: Normal range of motion and neck supple.  Cardiovascular:     Rate and Rhythm: Normal rate and regular rhythm.     Pulses: Normal pulses.     Heart sounds: Normal heart sounds. No murmur. No friction rub. No gallop.   Pulmonary:     Effort: Pulmonary effort is normal. No respiratory distress.     Breath sounds: Normal breath sounds. No stridor. No wheezing, rhonchi or rales.  Chest:     Chest wall: No tenderness.   Musculoskeletal: Normal range of motion.  Skin:    General: Skin is warm and dry.     Capillary Refill: Capillary refill takes less than 2 seconds.     Coloration: Skin is not jaundiced or pale.     Findings: No bruising, erythema, lesion or rash.  Neurological:     General: No focal deficit present.     Mental Status: He is alert and oriented to person, place, and time. Mental status is at baseline.  Psychiatric:        Mood and Affect: Mood normal.        Behavior: Behavior normal.        Thought Content: Thought content normal.        Judgment: Judgment normal.     Results for orders placed or performed in visit on 09/10/18  CoaguChek XS/INR Waived  Result Value Ref Range   INR 2.5 (H) 0.9 - 1.1   Prothrombin Time 30.5 sec      Assessment & Plan:   Problem List Items Addressed This Visit    None    Visit Diagnoses    Upper respiratory tract infection, unspecified type    -  Primary   Flu negative. Long Company secretary. Unknown exposures. Room mate also sick. Will send COVID. Home quarantine for 14 days or until test negative.    Relevant Orders   Veritor Flu A/B Waived   Novel Coronavirus, NAA ([facility])       Follow up plan: Return if symptoms worsen or fail to improve.

## 2018-09-17 ENCOUNTER — Ambulatory Visit: Payer: Medicare Other | Admitting: Nurse Practitioner

## 2018-09-17 ENCOUNTER — Ambulatory Visit: Payer: Medicare Other | Admitting: Family Medicine

## 2018-09-20 ENCOUNTER — Telehealth: Payer: Self-pay | Admitting: Family Medicine

## 2018-09-20 NOTE — Telephone Encounter (Signed)
COVID testing is not back yet. Needs to remain in quarantine.   I don't know what this means? Please check on patient and see how he's doing.

## 2018-09-20 NOTE — Telephone Encounter (Signed)
Called and spoke to patient. Message relayed. Patient sounded a bit upset and started talking about his boyfriend/ husband Roberto [name] saying that she wants Dr. [name] to call in some prednisone for him because he had the same symptoms while back ago and the prednisone helped him to get better. Patient stated that she does not think [name] has the COVID-19.  

## 2018-09-20 NOTE — Telephone Encounter (Signed)
Copied from [place] [phone]. Topic: General - Other >> Sep 20, 2018 11:19 AM Carolyn Stare wrote:  Waiting on results from test cause he need medication

## 2018-09-23 ENCOUNTER — Telehealth: Payer: Self-pay | Admitting: Family Medicine

## 2018-09-23 NOTE — Telephone Encounter (Signed)
Please let him know that her COVID came back negative. He is good to leave quarantine as long as she has not had a fever in the last 72 hours with no medication. Please check on how he's feeling and make sure he's better.

## 2018-09-23 NOTE — Telephone Encounter (Signed)
Relayed by Tiffany Reel.

## 2018-09-24 ENCOUNTER — Ambulatory Visit (INDEPENDENT_AMBULATORY_CARE_PROVIDER_SITE_OTHER): Payer: Medicare Other | Admitting: Family Medicine

## 2018-09-24 ENCOUNTER — Encounter: Payer: Self-pay | Admitting: Family Medicine

## 2018-09-24 ENCOUNTER — Other Ambulatory Visit: Payer: Self-pay | Admitting: Family Medicine

## 2018-09-24 ENCOUNTER — Ambulatory Visit: Payer: Self-pay | Admitting: Family Medicine

## 2018-09-24 ENCOUNTER — Other Ambulatory Visit: Payer: Self-pay

## 2018-09-24 VITALS — BP 113/73 | HR 66 | Temp 98.5°F

## 2018-09-24 DIAGNOSIS — J441 Chronic obstructive pulmonary disease with (acute) exacerbation: Secondary | ICD-10-CM | POA: Diagnosis not present

## 2018-09-24 DIAGNOSIS — Z952 Presence of prosthetic heart valve: Secondary | ICD-10-CM

## 2018-09-24 NOTE — Telephone Encounter (Signed)
Copied from [place] [phone]. Topic: General - Other >> Sep 24, 2018  4:46 PM Keene Breath wrote: Reason for CRM: Patient called and stated that the doctor said she would call him in his medication at his local pharmacy.  Patient spoke with the pharmacy and they have not received any scripts as of yet.  Patient stated he is sick and he needs his medication as soon as possible.  Please advise and call patient back at [phone]

## 2018-09-24 NOTE — Assessment & Plan Note (Signed)
Not doing well. In exacerbation. Will treat with prednisone, azithromycin and albuterol. Recheck 2 weeks. Call with any concerns.

## 2018-09-24 NOTE — Progress Notes (Signed)
BP 113/73   Pulse 66   Temp 98.5 F (36.9 C) (Oral)   SpO2 95%    Subjective:    Patient ID: Roberto [name], male    DOB: 1956-04-17, 63 y.o.   MRN: [id number]  HPI: Roberto [name] is a 63 y.o. male  Chief Complaint  Patient presents with  . Cough    Continuing, dry.   . Chills    Occassionally.   . Weakness   UPPER RESPIRATORY TRACT INFECTION Duration: 2 weeks Worst symptom: cough, body aches Fever: no Cough: yes Shortness of breath: yes Wheezing: no Chest pain: yes, with cough Chest tightness: yes Chest congestion: yes Nasal congestion: no Runny nose: no Post nasal drip: no Sneezing: no Sore throat: no Swollen glands: no Sinus pressure: no Headache: no Face pain: no Toothache: no Ear pain: no  Ear pressure: no  Eyes red/itching:no Eye drainage/crusting: no  Vomiting: no Rash: no Fatigue: yes Sick contacts: yes Strep contacts: no  Context: worse Recurrent sinusitis: no Relief with OTC cold/cough medications: no  Treatments attempted: cold/sinus, mucinex and anti-histamine   Relevant past medical, surgical, family and social history reviewed and updated as indicated. Interim medical history since our last visit reviewed. Allergies and medications reviewed and updated.  Review of Systems  Constitutional: Positive for fatigue. Negative for activity change, appetite change, chills, diaphoresis, fever and unexpected weight change.  HENT: Negative for congestion, dental problem, drooling, ear discharge, ear pain, facial swelling, hearing loss, mouth sores, nosebleeds, postnasal drip, rhinorrhea, sinus pressure, sinus pain, sneezing, sore throat, trouble swallowing and voice change.   Eyes: Negative.   Respiratory: Positive for cough, shortness of breath and wheezing. Negative for apnea, choking, chest tightness and stridor.   Cardiovascular: Negative.   Gastrointestinal: Negative.   Neurological: Negative.   Psychiatric/Behavioral: Negative.     Per  HPI unless specifically indicated above     Objective:    BP 113/73   Pulse 66   Temp 98.5 F (36.9 C) (Oral)   SpO2 95%   Wt Readings from Last 3 Encounters:  09/10/18 (!) 330 lb 4 oz (149.8 kg)  07/25/18 (!) 330 lb 12.8 oz (150 kg)  07/11/18 (!) 329 lb 9.6 oz (149.5 kg)    Physical Exam Vitals signs and nursing note reviewed.  Constitutional:      General: He is not in acute distress.    Appearance: Normal appearance. He is not ill-appearing, toxic-appearing or diaphoretic.  HENT:     Head: Normocephalic and atraumatic.     Right Ear: Tympanic membrane, ear canal and external ear normal. There is no impacted cerumen.     Left Ear: Tympanic membrane, ear canal and external ear normal. There is no impacted cerumen.     Nose: Congestion and rhinorrhea present.     Mouth/Throat:     Mouth: Mucous membranes are moist.     Pharynx: Oropharynx is clear. No oropharyngeal exudate or posterior oropharyngeal erythema.  Eyes:     General: No scleral icterus.       Right eye: Discharge present.        Left eye: No discharge.     Extraocular Movements: Extraocular movements intact.     Conjunctiva/sclera: Conjunctivae normal.     Pupils: Pupils are equal, round, and reactive to light.  Neck:     Musculoskeletal: Normal range of motion and neck supple. No neck rigidity or muscular tenderness.     Vascular: No carotid bruit.  Cardiovascular:  Rate and Rhythm: Normal rate and regular rhythm.     Pulses: Normal pulses.     Heart sounds: Normal heart sounds. No murmur. No friction rub. No gallop.   Pulmonary:     Effort: Pulmonary effort is normal. No respiratory distress.     Breath sounds: No stridor. Wheezing (expiratory) present. No rhonchi or rales.  Chest:     Chest wall: No tenderness.  Musculoskeletal: Normal range of motion.  Lymphadenopathy:     Cervical: Cervical adenopathy present.  Skin:    General: Skin is warm and dry.     Capillary Refill: Capillary refill takes  less than 2 seconds.     Coloration: Skin is not jaundiced or pale.     Findings: No bruising, erythema, lesion or rash.  Neurological:     General: No focal deficit present.     Mental Status: He is alert and oriented to person, place, and time. Mental status is at baseline.     Cranial Nerves: No cranial nerve deficit.     Sensory: No sensory deficit.     Motor: No weakness.     Coordination: Coordination normal.     Gait: Gait normal.     Deep Tendon Reflexes: Reflexes normal.  Psychiatric:        Mood and Affect: Mood normal.        Behavior: Behavior normal.        Thought Content: Thought content normal.        Judgment: Judgment normal.     Results for orders placed or performed in visit on 09/16/18  Veritor Flu A/B Waived  Result Value Ref Range   Influenza A Negative Negative   Influenza B Negative Negative      Assessment & Plan:   Problem List Items Addressed This Visit      Respiratory   COPD (chronic obstructive pulmonary disease) ([place]) - Primary    Not doing well. In exacerbation. Will treat with prednisone, azithromycin and albuterol. Recheck 2 weeks. Call with any concerns.         Other   H/O mechanical aortic valve replacement    Will get set up for home INR with current situation with COVID          Follow up plan: Return in about 2 weeks (around 10/08/2018) for Video follow up on breathing.

## 2018-09-24 NOTE — Assessment & Plan Note (Signed)
Will get set up for home INR with current situation with COVID

## 2018-09-25 MED ORDER — AZITHROMYCIN 250 MG PO TABS: ORAL_TABLET | ORAL | 0 refills | Status: DC

## 2018-09-25 MED ORDER — ALBUTEROL SULFATE HFA 108 (90 BASE) MCG/ACT IN AERS: 2.0000 | INHALATION_SPRAY | Freq: Four times a day (QID) | RESPIRATORY_TRACT | 3 refills | Status: DC | PRN

## 2018-09-25 MED ORDER — PREDNISONE 10 MG PO TABS: ORAL_TABLET | ORAL | 0 refills | Status: DC

## 2018-09-25 MED ORDER — HYDROCOD POLST-CPM POLST ER 10-8 MG/5ML PO SUER: 5.0000 mL | Freq: Every evening | ORAL | 0 refills | Status: DC | PRN

## 2018-09-25 NOTE — Addendum Note (Signed)
Addended by: Valerie Roys on: 09/25/2018 10:04 AM   Modules accepted: Orders

## 2018-09-25 NOTE — Telephone Encounter (Signed)
Patient notified

## 2018-09-25 NOTE — Telephone Encounter (Signed)
I'm not sure it will be covered by his insurance, but it has been called over for him.

## 2018-09-25 NOTE — Telephone Encounter (Signed)
Patient notified about medications. Was under the impression that cough syrup was going to be called in as well. Routing to provider to advise.

## 2018-09-25 NOTE — Telephone Encounter (Signed)
Copied from [place] [id number]. Topic: Quick Communication - Rx Refill/Question >> Sep 25, 2018 10:06 AM Alanda Slim E wrote: Medication: Prednisone taper and a cough medicine   Has the patient contacted their pharmacy? Yes - Pharmacy called in stating that they were expecting the above meds for the Pt/ please advise   Preferred Pharmacy (with phone number or street name): [facility] [place] - [address]  Agent: Please be advised that RX refills may take up to 3 business days. We ask that you follow-up with your pharmacy.

## 2018-09-25 NOTE — Telephone Encounter (Signed)
Medicine has already been called to the pharmacy. See below.

## 2018-09-25 NOTE — Telephone Encounter (Signed)
Looks like somehow they didn't get to the pharmacy. They should be there now. Sorry about that.

## 2018-10-05 ENCOUNTER — Other Ambulatory Visit: Payer: Self-pay | Admitting: Internal Medicine

## 2018-10-05 ENCOUNTER — Other Ambulatory Visit: Payer: Self-pay | Admitting: Family Medicine

## 2018-10-05 ENCOUNTER — Other Ambulatory Visit: Payer: Self-pay | Admitting: Physician Assistant

## 2018-10-07 ENCOUNTER — Telehealth: Payer: Self-pay | Admitting: Family Medicine

## 2018-10-07 ENCOUNTER — Other Ambulatory Visit: Payer: Self-pay | Admitting: Family Medicine

## 2018-10-07 NOTE — Telephone Encounter (Signed)
No appropriate- will discuss if appropriate at his appointment. If feels like he is not better or getting worse, needs to go to urgent care so someone can listen to his lungs

## 2018-10-07 NOTE — Telephone Encounter (Signed)
Please notify patient.

## 2018-10-07 NOTE — Telephone Encounter (Signed)
Video appt scheduled 10-09-18 @ 9

## 2018-10-07 NOTE — Telephone Encounter (Signed)
Copied from [place] [id number]. Topic: Quick Communication - Rx Refill/Question >> Oct 07, 2018  2:58 PM Nils Flack, Marland Kitchen wrote: Medication: tussinex  Has the patient contacted their pharmacy (Agent: If no, request that the patient contact the pharmacy for the refill.) (Agent: If yes, when and what did the pharmacy advise?)  Preferred Pharmacy (with phone number [facility]): [facility]  Pt was only given 7 day supply due to restrictions because of opiod crisis, Pt says he needs more medication. Pt is leaving tomorrow for work, needs this today.  Please advise Cb is [phone]   Agent: Please be advised that RX refills may take up to 3 business days. We ask that you follow-up with your pharmacy.

## 2018-10-07 NOTE — Telephone Encounter (Signed)
LVM for patient to call back to either schedule virtual appointment if note only needed but if he needs to be seen for his cough he would have to be seen by Urgent Care.  Thank you

## 2018-10-07 NOTE — Telephone Encounter (Signed)
Copied from [place] [id number]. Topic: General - Other >> Oct 07, 2018  9:49 AM Percell Belt A wrote: Reason for CRM:   Vangie Bicker called in and state pt is still not feeling well, she thinks that he needs another round of Prednisone.  She stated it normally takes a couple rounds for him to get better. She also stated that he has to get on a [employer] bus for work everyday and with every that he has with COPD and his heart she thinks he does not need to get on that bus everyday.  She would like to know if there could be a note provide that pt is high risk?   Please advise   Best number - [phone]

## 2018-10-07 NOTE — Telephone Encounter (Signed)
Patient notified

## 2018-10-07 NOTE — Telephone Encounter (Signed)
Needs virtual visit for note. For cough and SOB needs to see urgent care as we are not allowed to see URIs in the office any more.

## 2018-10-08 LAB — PROTIME-INR

## 2018-10-09 ENCOUNTER — Other Ambulatory Visit: Payer: Self-pay

## 2018-10-09 ENCOUNTER — Encounter: Payer: Self-pay | Admitting: Family Medicine

## 2018-10-09 ENCOUNTER — Telehealth: Payer: Self-pay

## 2018-10-09 ENCOUNTER — Ambulatory Visit (INDEPENDENT_AMBULATORY_CARE_PROVIDER_SITE_OTHER): Payer: Medicare Other | Admitting: Family Medicine

## 2018-10-09 VITALS — BP 128/86 | HR 81

## 2018-10-09 DIAGNOSIS — J0100 Acute maxillary sinusitis, unspecified: Secondary | ICD-10-CM

## 2018-10-09 DIAGNOSIS — J441 Chronic obstructive pulmonary disease with (acute) exacerbation: Secondary | ICD-10-CM

## 2018-10-09 DIAGNOSIS — Z952 Presence of prosthetic heart valve: Secondary | ICD-10-CM

## 2018-10-09 MED ORDER — DOXYCYCLINE HYCLATE 100 MG PO TABS: 100.0000 mg | ORAL_TABLET | Freq: Two times a day (BID) | ORAL | 0 refills | Status: DC

## 2018-10-09 NOTE — Assessment & Plan Note (Signed)
Resolved. No more cough. Call with any concerns. Continue inhalers.

## 2018-10-09 NOTE — Assessment & Plan Note (Signed)
Supratherapeutic, but has not been eating a consistent amount of greens. Will increase his greens and recheck. Call with any concerns.

## 2018-10-09 NOTE — Progress Notes (Signed)
BP 128/86   Pulse 81    Subjective:    Patient ID: Roberto [name], male    DOB: [birth year], 63 y.o.   MRN: [id number]  HPI: Roberto [name] is a 63 y.o. male  Chief Complaint  Patient presents with  . INR    4.0  . Cough    Patient states that he is some better, not coughing near as much, he has some head pressure   Coumadin Management.  The expected duration of coumadin treatment is lifelong The reason for anticoagulation is  mechanical heart valve.  Present Coumadin dose: 7.5, 7.5, 4 alternating Goal: 2.5-3.5  Excessive bruising: no Nose bleeding: no Rectal bleeding: no Prolonged menstrual cycles: N/A Eating diet with consistent amounts of foods containing Vitamin K:no- due for his salad today, hadn't had it when he did the INR Any recent antibiotic use? no  He feels like his cough is much better, but he continues with body aches, and congestion. He has been having some pain over the L side of his face. No fevers. No chills. No nausea, no vomiting. He is otherwise feeling well with no other concerns or complaints at this time.   Relevant past medical, surgical, family and social history reviewed and updated as indicated. Interim medical history since our last visit reviewed. Allergies and medications reviewed and updated.  Review of Systems  Constitutional: Negative.  Negative for activity change, appetite change, chills, diaphoresis, fatigue, fever and unexpected weight change.  HENT: Positive for congestion and sinus pressure. Negative for dental problem, drooling, ear discharge, ear pain, facial swelling, hearing loss, mouth sores, nosebleeds, postnasal drip, rhinorrhea, sinus pain, sneezing, sore throat, tinnitus, trouble swallowing and voice change.   Respiratory: Negative.   Cardiovascular: Negative.   Musculoskeletal: Positive for arthralgias and myalgias. Negative for back pain, gait problem, joint swelling, neck pain and neck stiffness.  Skin: Negative.    Neurological: Negative.   Psychiatric/Behavioral: Negative.     Per HPI unless specifically indicated above     Objective:    BP 128/86   Pulse 81   Wt Readings from Last 3 Encounters:  09/10/18 (!) 330 lb 4 oz (149.8 kg)  07/25/18 (!) 330 lb 12.8 oz (150 kg)  07/11/18 (!) 329 lb 9.6 oz (149.5 kg)    Physical Exam Vitals signs and nursing note reviewed.  Constitutional:      General: He is not in acute distress.    Appearance: Normal appearance. He is not ill-appearing, toxic-appearing or diaphoretic.  HENT:     Head: Normocephalic and atraumatic.     Comments: Tenderness to maxillary sinus on the L    Right Ear: External ear normal.     Left Ear: External ear normal.     Nose: Congestion and rhinorrhea present.     Mouth/Throat:     Mouth: Mucous membranes are moist.     Pharynx: Oropharynx is clear. No oropharyngeal exudate or posterior oropharyngeal erythema.  Eyes:     General: No scleral icterus.       Right eye: No discharge.        Left eye: No discharge.     Conjunctiva/sclera: Conjunctivae normal.     Pupils: Pupils are equal, round, and reactive to light.  Neck:     Musculoskeletal: Normal range of motion.  Pulmonary:     Effort: Pulmonary effort is normal. No respiratory distress.     Comments: Speaking in full sentences Musculoskeletal: Normal range of motion.  Skin:  Coloration: Skin is not jaundiced or pale.     Findings: No bruising, erythema, lesion or rash.  Neurological:     Mental Status: He is alert and oriented to person, place, and time. Mental status is at baseline.  Psychiatric:        Mood and Affect: Mood normal.        Behavior: Behavior normal.        Thought Content: Thought content normal.        Judgment: Judgment normal.     Results for orders placed or performed in visit on 09/16/18  Veritor Flu A/B Waived  Result Value Ref Range   Influenza A Negative Negative   Influenza B Negative Negative      Assessment & Plan:    Problem List Items Addressed This Visit      Respiratory   COPD (chronic obstructive pulmonary disease) ([place])    Resolved. No more cough. Call with any concerns. Continue inhalers.         Other   H/O mechanical aortic valve replacement    Supratherapeutic, but has not been eating a consistent amount of greens. Will increase his greens and recheck. Call with any concerns.        Other Visit Diagnoses    Acute non-recurrent maxillary sinusitis    -  Primary   Will treat with doxycycline. Call with any concerns. Continue to monitor.    Relevant Medications   doxycycline (VIBRA-TABS) 100 MG tablet       Follow up plan: Return in about 4 weeks (around 11/06/2018) for follow up INR.   Marland Kitchen This visit was completed via FaceTime due to the restrictions of the COVID-19 pandemic. All issues as above were discussed and addressed. Physical exam was done as above through visual confirmation on FaceTime. If it was felt that the patient should be evaluated in the office, they were directed there. The patient verbally consented to this visit. . Location of the patient: home . Location of the provider: home . Those involved with this call:  . Provider: Park Liter, DO . CMA: Tiffany Reel, CMA . Front Desk/Registration: Don Perking  . Time spent on call: 25 minutes with patient face to face via video conference. More than 50% of this time was spent in counseling and coordination of care.

## 2018-10-09 NOTE — Telephone Encounter (Signed)
Call attempted to switch 10/24/2018 face to face visit with Dr. Saunders Revel to a telephone or video call due to current clinic policies related to [place] [facility] precautions. Left voicemail message for patient to call back to discuss.

## 2018-10-14 ENCOUNTER — Telehealth: Payer: Self-pay | Admitting: *Deleted

## 2018-10-14 NOTE — Telephone Encounter (Signed)

## 2018-10-16 MED ORDER — HYDROCOD POLST-CPM POLST ER 10-8 MG/5ML PO SUER: 5.0000 mL | Freq: Every evening | ORAL | 0 refills | Status: DC | PRN

## 2018-10-16 NOTE — Telephone Encounter (Signed)
Doxycycline was called in on 4/8- has he called the pharmacy? It should be there.

## 2018-10-16 NOTE — Telephone Encounter (Signed)
Tried Art therapist, no answer. Kept getting routed to the automated system. Called and spoke with [name]. She states that she just got off the phone with [facility] where they finally told her that the patient had doxycycline on file for him to pick up and the cough syrup that was just sent over. She states that the pharmacy is only giving them a day 7 RX because that what the state is allowing right now.

## 2018-10-16 NOTE — Addendum Note (Signed)
Addended by: Valerie Roys on: 10/16/2018 08:59 AM   Modules accepted: Orders

## 2018-10-16 NOTE — Telephone Encounter (Signed)
Pt friend kim called and stated that pt has not received a call from the pharmacy that medication (doxycycline) is ready. pt is getting worse now and would like tussinex now called in. Please advise.  [phone]

## 2018-10-17 ENCOUNTER — Encounter: Payer: Self-pay | Admitting: Family Medicine

## 2018-10-17 LAB — PROTIME-INR

## 2018-10-19 NOTE — Progress Notes (Signed)
Virtual Visit via Video Note   This visit type was conducted due to national recommendations for restrictions regarding the COVID-19 Pandemic (e.g. social distancing) in an effort to limit this patient's exposure and mitigate transmission in our community.  Due to his co-morbid illnesses, this patient is at least at moderate risk for complications without adequate follow up.  This format is felt to be most appropriate for this patient at this time.  All issues noted in this document were discussed and addressed.  A limited physical exam was performed with this format.  Please refer to the patient's chart for his consent to telehealth for [facility].   Evaluation Performed:  Follow-up visit  Date:  10/21/2018   ID:  Roberto [name], DOB 1956/03/01, MRN [id number]  Patient Location: Home Provider Location: Home  PCP:  Valerie Roys, DO  Cardiologist:  Nelva Bush, MD  Electrophysiologist:  None   Chief Complaint:  Shortness of breath and cough  History of Present Illness:    Roberto [name] is a 63 y.o. male with history of mechanical aortic valve replacement, stroke, right renal infarct, chronic systolic heart failure (LVEF 40-45%), and hyperlipidemia.  We are speaking today for follow-up of his cardiomyopathy and valvular heart disease.  I last saw him in December, at which time he reported limited mobility due to plantar fasciitis with associated 20 pound weight gain.  He did not have any signs or symptoms of decompensated heart failure.  Today, Roberto [name] reports that he is still recovering from a respiratory infection.  He had a viral illness several weeks ago and reportedly tested negative for COVID-19.  He had recurrence cough, shortness of breath congestion last week and was placed on prednisone by his PCP.  This has also been accompanied by body aches and intermittent lightheadedness.  He is scheduled for a her tomorrow.  He otherwise has been doing well without chest  pain, palpitations, orthopnea, PND, and edema.  Up until his recent viral infections, Roberto [name] had been exercising on the treadmill and has lost about 15 pounds since our last visit he is now checking his INRs at home and notes that his most recent reading last week was 2.5.  He denies bleeding.  He is tolerating his medications well.  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  The patient does have symptoms concerning for COVID-19 infection (fever, chills, cough, or new shortness of breath).    Past Medical History:  Diagnosis Date  . Bicuspid aortic valve    a. s/p #27 Carbomedics mechanical valve on 03/25/2010; b. on Coumadin; c. TTE 12/17: EF 40-45%, moderately dilated LV with moderate LVH, AVR well-seated with 14 mmHg gradient, peak AV velocity 2.5 m/s, mild mitral valve thickening with mild MR, mildly dilated RV with mildly reduced contraction  . Cellulitis   . Chronic systolic CHF (congestive heart failure) ([place])    a. R/LHC 03/2010 showed no significant CAD, LVEDP 31 mmHg, mean AoV gradient 34 mmHg at rest and 47 mmHg with dobutamine 20 mcg/kg/min, AVA 1.0 cm^2, RA 31, RV 68/25, PA 68/47, PCWP 38. PA sat 65%. CO 6.2 L/min (Fick) and 5.3 L/min (thermodilution)  . Clotting disorder ([place])   . H/O mechanical aortic valve replacement 03/25/2010   a. #27 Carbomedics mechanical valve  . Hypercholesterolemia   . Renal infarct [facility]) 2017   Multiple right renal infarcts, likely embolic.  . Stroke ([place])   .  TIA (transient ischemic attack) 05/2014   Past Surgical History:  Procedure Laterality Date  . AORTIC VALVE REPLACEMENT    . CARDIAC CATHETERIZATION  03/21/2010   No significant CAD. Severe aortic stenosis. Severely elevated left and right heart filling pressures.  Marland Kitchen CARDIAC SURGERY  2009   CHF  . CARPAL TUNNEL RELEASE Left 2005  . TONSILLECTOMY  1962     Current Meds  Medication Sig  . albuterol (PROVENTIL  HFA;VENTOLIN HFA) 108 (90 Base) MCG/ACT inhaler Inhale 2 puffs into the lungs every 6 (six) hours as needed for wheezing or shortness of breath.  Marland Kitchen albuterol (PROVENTIL) (2.5 MG/3ML) 0.083% nebulizer solution Take 3 mLs (2.5 mg total) by nebulization every 6 (six) hours as needed for wheezing or shortness of breath.  . allopurinol (ZYLOPRIM) 100 MG tablet Take 1 tablet by mouth once daily  . aspirin 81 MG chewable tablet Chew 81 mg by mouth daily.    . chlorpheniramine-HYDROcodone (TUSSIONEX PENNKINETIC ER) 10-8 MG/5ML SUER Take 5 mLs by mouth at bedtime as needed.  . cyclobenzaprine (FLEXERIL) 10 MG tablet TAKE 1 TABLET BY MOUTH TWICE DAILY AS NEEDED FOR  MUSCLE  SPASM  . doxycycline (VIBRA-TABS) 100 MG tablet Take 1 tablet (100 mg total) by mouth 2 (two) times daily.  . Fluticasone-Salmeterol (ADVAIR) 250-50 MCG/DOSE AEPB Inhale 1 puff into the lungs 2 (two) times daily as needed.  . furosemide (LASIX) 40 MG tablet Take 1 tablet by mouth once daily  . lisinopril (PRINIVIL,ZESTRIL) 5 MG tablet Take 1 tablet (5 mg total) by mouth daily.  . metoprolol succinate (TOPROL-XL) 100 MG 24 hr tablet TAKE 1 TABLET BY MOUTH ONCE DAILY WITH  OR  IMMEDIATELY  FOLLOWING  A  MEAL  . simvastatin (ZOCOR) 40 MG tablet Take 1 tablet (40 mg total) by mouth at bedtime.  Marland Kitchen spironolactone (ALDACTONE) 25 MG tablet Take 1/2 (one-half) tablet by mouth once daily  . tiotropium (SPIRIVA) 18 MCG inhalation capsule Place 18 mcg into inhaler and inhale daily as needed.  . VENTOLIN HFA 108 (90 Base) MCG/ACT inhaler INHALE 2 PUFFS BY MOUTH EVERY 6 HOURS AS NEEDED FOR WHEEZING AND OR SHORTNESS OF BREATH  . warfarin (COUMADIN) 4 MG tablet Take 1 tablet (4 mg total) by mouth daily at 6 PM. Take as directed  . warfarin (COUMADIN) 7.5 MG tablet Take as directed     Allergies:   Patient has no known allergies.   Social History   Tobacco Use  . Smoking status: Current Every Day Smoker    Packs/day: 0.50    Years: 46.00    Pack  years: 23.00    Types: Cigarettes    Start date: 07/16/2017  . Smokeless tobacco: Never Used  . Tobacco comment: 10 cigarettes a day  Substance Use Topics  . Alcohol use: No    Alcohol/week: 0.0 standard drinks  . Drug use: No     Family Hx: The patient's family history includes Arthritis in his father and mother; Breast cancer in his sister; Cancer in his brother; Colon cancer in his father and mother; Dementia in his mother; Diabetes in his father; Heart attack in his brother and brother; Heart disease in his brother; Seizures in his sister; Stroke in his father.  ROS:   Please see the history of present illness.   All other systems reviewed and are negative.   Prior CV studies:   The following studies were reviewed today:  Cath/PCI:  LHC/RHC (03/21/10, [place], [place]): No  significant CAD. LVEDP 31 mmHg. Mean AoV gradient 34 mmHg at rest and 47 mmHg with dobutamine 20 mcg/kg/min. AVA 1.0 cm^2. RA 31, RV 68/25, PA 68/47, PCWP 38. PA sat 65%. CO 6.2 L/min (Fick) and 5.3 L/min (thermodilution).  CV Surgery:  Mechanical aortic valve replacement and ascending aortoplasty (03/25/10, [place], [place]): #27 Carbomedics mechanical valve.  Non-Invasive Evaluation(s):  Transthoracic echocardiogram (06/05/16): Technically difficult study. Moderately dilated left ventricle with moderate LVH. Moderately reduced LV contraction (EF 40-45%). AVR well-seated with 14 mmHg gradient. Peak AV velocity 2.5 m/s. Mild mitral valve thickening with mild MR. Mildly dilated RV with mildly reduced contraction.  Transthoracic echocardiogram (05/13/14, [facility]): Mild LVH with LVEF 35-40% and global hypokinesis. Mechanical aortic valve present with trace regurgitation. Normal LVOT gradient. Mild TR. Mild pulmonary hypertension. Normal RV size and function.  Labs/Other Tests and Data Reviewed:    EKG:  No ECG reviewed.  Recent Labs: 07/25/2018:  ALT 14; BUN 21; Creatinine, Ser 1.09; Hemoglobin 15.3; Platelets 254; Potassium 4.7; Sodium 141; TSH 1.520   Recent Lipid Panel Lab Results  Component Value Date/Time   CHOL 202 (H) 07/25/2018 02:36 PM   CHOL 177 05/17/2017 01:11 PM   CHOL 131 05/25/2014 05:56 AM   TRIG 271 (H) 07/25/2018 02:36 PM   TRIG 361 (H) 05/17/2017 01:11 PM   TRIG 167 05/25/2014 05:56 AM   HDL 41 07/25/2018 02:36 PM   HDL 30 (L) 05/25/2014 05:56 AM   CHOLHDL 4.9 03/15/2018 09:35 AM   LDLCALC 107 (H) 07/25/2018 02:36 PM   LDLCALC 68 05/25/2014 05:56 AM    Wt Readings from Last 3 Encounters:  10/21/18 (!) 315 lb (142.9 kg)  09/10/18 (!) 330 lb 4 oz (149.8 kg)  07/25/18 (!) 330 lb 12.8 oz (150 kg)     Objective:    Vital Signs:  BP 128/80 Comment: No BP cuff  Pulse 74   Ht 5\' 6"  (1.676 m)   Wt (!) 315 lb (142.9 kg)   BMI 50.84 kg/m    VITAL SIGNS:  reviewed GEN:  no acute distress  ASSESSMENT & PLAN:    Chronic systolic heart failure: Roberto [name] denies edema or weight gain, though he has experienced shortness of breath in the setting of recent viral illnesses.  This is slowly improving with prednisone.  His weight is actually down 15 pounds from her last visit; I congratulated him on this.  I suspect his current symptoms are most likely due to her [place] tract infection closely monitor his weight to her dose of furosemide if he were to begin weight or have swelling.  We will continue the remainder of his evidence-based heart failure therapies for a repeat BMP in 3 months.  History of aortic stenosis status post mechanical AVR: Patient doing well, remaining therapeutically anticoagulated.  We will continue warfarin and aspirin.  Hypertension: Home blood pressure reading reasonable today.  Continue current medications.  Morbid obesity: I congratulated Roberto [name] on his weight loss exercise up until recent respiratory infection.  Restart his exercise regimen when his URI symptoms have improved.   COVID-19 Education: The signs and symptoms of COVID-19 were discussed with the patient and how to seek care for testing (follow up with PCP or arrange E-visit).  The importance of social distancing was discussed today.  Time:   Today, I have spent 10 minutes with the patient with telehealth technology discussing the above problems.  An additional 10 minutes were spent reviewing the patient's chart and  documenting today's encounter.   Medication Adjustments/Labs and Tests Ordered: Current medicines are reviewed at length with the patient today.  Concerns regarding medicines are outlined above.   Tests Ordered: None.  Medication Changes: None.  Disposition:  Follow up in 3 month(s)  Signed, Nelva Bush, MD  10/21/2018 9:42 AM    [facility]

## 2018-10-21 ENCOUNTER — Encounter: Payer: Self-pay | Admitting: Internal Medicine

## 2018-10-21 ENCOUNTER — Other Ambulatory Visit: Payer: Self-pay

## 2018-10-21 ENCOUNTER — Telehealth (INDEPENDENT_AMBULATORY_CARE_PROVIDER_SITE_OTHER): Payer: Medicare Other | Admitting: Internal Medicine

## 2018-10-21 VITALS — BP 128/80 | HR 74 | Ht 66.0 in | Wt 315.0 lb

## 2018-10-21 DIAGNOSIS — I5022 Chronic systolic (congestive) heart failure: Secondary | ICD-10-CM | POA: Diagnosis not present

## 2018-10-21 DIAGNOSIS — I10 Essential (primary) hypertension: Secondary | ICD-10-CM

## 2018-10-21 DIAGNOSIS — Z8679 Personal history of other diseases of the circulatory system: Secondary | ICD-10-CM

## 2018-10-21 DIAGNOSIS — Z952 Presence of prosthetic heart valve: Secondary | ICD-10-CM

## 2018-10-21 DIAGNOSIS — Z7189 Other specified counseling: Secondary | ICD-10-CM | POA: Diagnosis not present

## 2018-10-21 NOTE — Patient Instructions (Signed)
Medication Instructions:  Your physician recommends that you continue on your current medications as directed. Please refer to the Current Medication list given to you today.  If you have weight gain or swelling, you may take an extra furosemide.   If you need a refill on your cardiac medications before your next appointment, please call your pharmacy.   Lab work: You will need lab work in 3 months at your next follow up appointment with [place].   If you have labs (blood work) drawn today and your tests are completely normal, you will receive your results only by: Marland Kitchen MyChart Message (if you have MyChart) OR . A paper copy in the mail If you have any lab test that is abnormal or we need to change your treatment, we will call you to review the results.  Testing/Procedures: none  Follow-Up: At [facility], you and your health needs are our priority.  As part of our continuing mission to provide you with exceptional heart care, we have created designated Provider Care Teams.  These Care Teams include your primary Cardiologist (physician) and Advanced Practice Providers (APPs -  Physician Assistants and Nurse Practitioners) who all work together to provide you with the care you need, when you need it. You will need a follow up appointment in 3 months.  Please call our office 2 months in advance to schedule this appointment.  You may see Nelva Bush, MD or one of the following Advanced Practice Providers on your designated Care Team:   Murray Hodgkins, NP Christell Faith, [name] . Marrianne Mood, [name]

## 2018-10-22 ENCOUNTER — Other Ambulatory Visit: Payer: Self-pay

## 2018-10-22 ENCOUNTER — Ambulatory Visit (INDEPENDENT_AMBULATORY_CARE_PROVIDER_SITE_OTHER): Payer: Medicare Other | Admitting: Family Medicine

## 2018-10-22 ENCOUNTER — Encounter: Payer: Self-pay | Admitting: Family Medicine

## 2018-10-22 DIAGNOSIS — J441 Chronic obstructive pulmonary disease with (acute) exacerbation: Secondary | ICD-10-CM | POA: Diagnosis not present

## 2018-10-22 DIAGNOSIS — Z952 Presence of prosthetic heart valve: Secondary | ICD-10-CM

## 2018-10-22 DIAGNOSIS — Z7901 Long term (current) use of anticoagulants: Secondary | ICD-10-CM | POA: Diagnosis not present

## 2018-10-22 LAB — PROTIME-INR

## 2018-10-22 NOTE — Assessment & Plan Note (Signed)
INR 2.5 last and 2.7 today- continue current regimen. Continue to monitor. Call with any concerns.

## 2018-10-22 NOTE — Assessment & Plan Note (Signed)
Finish doxycycline and tussionex. Call with any concerns. Nebulizer 3x daily until better. Call with any concerns.

## 2018-10-22 NOTE — Progress Notes (Signed)
There were no vitals taken for this visit.   Subjective:    Patient ID: Roberto [name], male    DOB: Sep 06, 1955, 63 y.o.   MRN: [id number]  HPI: Roberto [name] is a 63 y.o. male  Chief Complaint  Patient presents with  . PTINR    2.5 last week.   . Cold follow up   Coumadin Management.  The expected duration of coumadin treatment is lifelong The reason for anticoagulation is  mechanical heart valve.  Present Coumadin dose: 7.5, 7.5, 4 alternating Goal: 2.5-3.5  Excessive bruising: no Nose bleeding: no Rectal bleeding: no Prolonged menstrual cycles: N/A Eating diet with consistent amounts of foods containing Vitamin K:yes Any recent antibiotic use? no   Feeling about 90% better with his URI. Still has a bit of a cough. Still using his inhaler and his nebulizer. Has not finished his antibiotics. No fevers. No chills. Otherwise feeling well with no other concerns or complaints at this time.    Relevant past medical, surgical, family and social history reviewed and updated as indicated. Interim medical history since our last visit reviewed. Allergies and medications reviewed and updated.  Review of Systems  Constitutional: Negative.   HENT: Negative.   Respiratory: Positive for cough. Negative for apnea, choking, chest tightness, shortness of breath, wheezing and stridor.   Cardiovascular: Negative.   Gastrointestinal: Negative.   Hematological: Negative.   Psychiatric/Behavioral: Negative.     Per HPI unless specifically indicated above     Objective:    There were no vitals taken for this visit.  Wt Readings from Last 3 Encounters:  10/21/18 (!) 315 lb (142.9 kg)  09/10/18 (!) 330 lb 4 oz (149.8 kg)  07/25/18 (!) 330 lb 12.8 oz (150 kg)    Physical Exam Vitals signs and nursing note reviewed.  Constitutional:      General: He is not in acute distress.    Appearance: Normal appearance. He is not ill-appearing, toxic-appearing or diaphoretic.  HENT:   Head: Normocephalic and atraumatic.     Right Ear: External ear normal.     Left Ear: External ear normal.     Nose: Nose normal.     Mouth/Throat:     Mouth: Mucous membranes are moist.     Pharynx: Oropharynx is clear.  Eyes:     General: No scleral icterus.       Right eye: No discharge.        Left eye: No discharge.     Conjunctiva/sclera: Conjunctivae normal.     Pupils: Pupils are equal, round, and reactive to light.  Neck:     Musculoskeletal: Normal range of motion.  Pulmonary:     Effort: Pulmonary effort is normal. No respiratory distress.     Comments: Speaking in full sentences Musculoskeletal: Normal range of motion.  Skin:    Coloration: Skin is not jaundiced or pale.     Findings: No bruising, erythema, lesion or rash.  Neurological:     Mental Status: He is alert and oriented to person, place, and time. Mental status is at baseline.  Psychiatric:        Mood and Affect: Mood normal.        Behavior: Behavior normal.        Thought Content: Thought content normal.        Judgment: Judgment normal.     Results for orders placed or performed in visit on 10/08/18  Protime-INR  Result Value Ref Range   INR  Assessment & Plan:   Problem List Items Addressed This Visit      Respiratory   COPD (chronic obstructive pulmonary disease) ([place]) - Primary    Finish doxycycline and tussionex. Call with any concerns. Nebulizer 3x daily until better. Call with any concerns.          Other   H/O mechanical aortic valve replacement    INR 2.5 last and 2.7 today- continue current regimen. Continue to monitor. Call with any concerns.           Follow up plan: Return in about 4 weeks (around 11/19/2018).    . This visit was completed via FaceTime due to the restrictions of the COVID-19 pandemic. All issues as above were discussed and addressed. Physical exam was done as above through visual confirmation on FaceTime. If it was felt that the patient should be  evaluated in the office, they were directed there. The patient verbally consented to this visit. . Location of the patient: home . Location of the provider: work . Those involved with this call:  . Provider: Park Liter, DO . CMA: Tiffany Reel, CMA . Front Desk/Registration: Don Perking  . Time spent on call: 15 minutes on the phone discussing health concerns. 23 minutes total spent in review of patient's record and preparation of their chart.

## 2018-10-24 ENCOUNTER — Ambulatory Visit: Payer: Medicare Other | Admitting: Family Medicine

## 2018-10-25 ENCOUNTER — Ambulatory Visit: Payer: Medicare Other | Admitting: Internal Medicine

## 2018-10-29 LAB — PROTIME-INR

## 2018-11-04 ENCOUNTER — Emergency Department: Payer: Medicare Other

## 2018-11-04 ENCOUNTER — Ambulatory Visit (INDEPENDENT_AMBULATORY_CARE_PROVIDER_SITE_OTHER): Payer: Medicare Other | Admitting: Nurse Practitioner

## 2018-11-04 ENCOUNTER — Telehealth: Payer: Self-pay

## 2018-11-04 ENCOUNTER — Emergency Department
Admission: EM | Admit: 2018-11-04 | Discharge: 2018-11-04 | Disposition: A | Discharge status: Home / Self Care | Payer: Medicare Other | Attending: Emergency Medicine | Admitting: Emergency Medicine

## 2018-11-04 ENCOUNTER — Other Ambulatory Visit: Payer: Self-pay

## 2018-11-04 ENCOUNTER — Encounter: Payer: Self-pay | Admitting: Nurse Practitioner

## 2018-11-04 VITALS — BP 105/57 | HR 85 | Temp 97.9°F | Ht 66.0 in | Wt 343.0 lb

## 2018-11-04 DIAGNOSIS — Z79899 Other long term (current) drug therapy: Secondary | ICD-10-CM | POA: Insufficient documentation

## 2018-11-04 DIAGNOSIS — I5022 Chronic systolic (congestive) heart failure: Secondary | ICD-10-CM | POA: Diagnosis not present

## 2018-11-04 DIAGNOSIS — R0600 Dyspnea, unspecified: Secondary | ICD-10-CM | POA: Diagnosis not present

## 2018-11-04 DIAGNOSIS — Z7982 Long term (current) use of aspirin: Secondary | ICD-10-CM | POA: Insufficient documentation

## 2018-11-04 DIAGNOSIS — I110 Hypertensive heart disease with heart failure: Secondary | ICD-10-CM | POA: Diagnosis not present

## 2018-11-04 DIAGNOSIS — I509 Heart failure, unspecified: Secondary | ICD-10-CM | POA: Diagnosis not present

## 2018-11-04 DIAGNOSIS — R0602 Shortness of breath: Secondary | ICD-10-CM

## 2018-11-04 DIAGNOSIS — E119 Type 2 diabetes mellitus without complications: Secondary | ICD-10-CM | POA: Diagnosis not present

## 2018-11-04 DIAGNOSIS — F17210 Nicotine dependence, cigarettes, uncomplicated: Secondary | ICD-10-CM | POA: Diagnosis not present

## 2018-11-04 DIAGNOSIS — Z7901 Long term (current) use of anticoagulants: Secondary | ICD-10-CM | POA: Insufficient documentation

## 2018-11-04 DIAGNOSIS — R2243 Localized swelling, mass and lump, lower limb, bilateral: Secondary | ICD-10-CM | POA: Insufficient documentation

## 2018-11-04 DIAGNOSIS — Z20828 Contact with and (suspected) exposure to other viral communicable diseases: Secondary | ICD-10-CM | POA: Diagnosis not present

## 2018-11-04 LAB — CBC
HCT: 45.2 % (ref 39.0–52.0)
Hemoglobin: 14.9 g/dL (ref 13.0–17.0)
MCH: 29.7 pg (ref 26.0–34.0)
MCHC: 33 g/dL (ref 30.0–36.0)
MCV: 90 fL (ref 80.0–100.0)
Platelets: 270 10*3/uL (ref 150–400)
RBC: 5.02 MIL/uL (ref 4.22–5.81)
RDW: 14.7 % (ref 11.5–15.5)
WBC: 9.7 10*3/uL (ref 4.0–10.5)
nRBC: 0 % (ref 0.0–0.2)

## 2018-11-04 LAB — BASIC METABOLIC PANEL
Anion gap: 10 (ref 5–15)
BUN: 30 mg/dL — ABNORMAL HIGH (ref 8–23)
CO2: 23 mmol/L (ref 22–32)
Calcium: 9.1 mg/dL (ref 8.9–10.3)
Chloride: 104 mmol/L (ref 98–111)
Creatinine, Ser: 1.18 mg/dL (ref 0.61–1.24)
GFR calc Af Amer: >60 mL/min (ref >60)
GFR calc non Af Amer: >60 mL/min (ref >60)
Glucose, Bld: 108 mg/dL — ABNORMAL HIGH (ref 70–99)
Potassium: 4.4 mmol/L (ref 3.5–5.1)
Sodium: 137 mmol/L (ref 135–145)

## 2018-11-04 LAB — BRAIN NATRIURETIC PEPTIDE: B Natriuretic Peptide: 77 pg/mL (ref 0.0–100.0)

## 2018-11-04 LAB — TROPONIN I: Troponin I: <0.03 ng/mL (ref <0.03)

## 2018-11-04 MED ORDER — SODIUM CHLORIDE 0.9% FLUSH: 3.0000 mL | Freq: Once | INTRAVENOUS | Status: DC

## 2018-11-04 NOTE — ED Triage Notes (Signed)
Pt c/o gaining 30lbs in the past 11 days, with swelling of the abd and LE , c/o having SOB with it. Denies having a cough or other sx. Pt is in NAD on arrival. Pt ambulatory to triage with no distress noted.

## 2018-11-04 NOTE — ED Provider Notes (Signed)
[name] Emergency Department Provider Note  Time seen: 9:12 PM  I have reviewed the triage vital signs and the nursing notes.   HISTORY  Chief Complaint Shortness of Breath and Leg Swelling   HPI Roberto [name] is a 63 y.o. male with a past medical history of CHF, CVA, renal infarct, aortic valve replacement on Coumadin presents to the emergency department for shortness of breath weight gain and leg swelling.  According to the patient over the past 1 week he has noticed weight gain, over the past several days he has become short of breath especially with exertion he has noted increased lower extremity edema.  Patient denies any chest pain at any point.   Patient denies any fever cough or congestion.  Past Medical History:  Diagnosis Date  . Bicuspid aortic valve    a. s/p #27 [employer] mechanical valve on 03/25/2010; b. on Coumadin; c. TTE 12/17: EF 40-45%, moderately dilated LV with moderate LVH, AVR well-seated with 14 mmHg gradient, peak AV velocity 2.5 m/s, mild mitral valve thickening with mild MR, mildly dilated RV with mildly reduced contraction  . Cellulitis   . Chronic systolic CHF (congestive heart failure) ([place])    a. R/LHC 03/2010 showed no significant CAD, LVEDP 31 mmHg, mean AoV gradient 34 mmHg at rest and 47 mmHg with dobutamine 20 mcg/kg/min, AVA 1.0 cm^2, RA 31, RV 68/25, PA 68/47, PCWP 38. PA sat 65%. CO 6.2 L/min ([place]) and 5.3 L/min (thermodilution)  . Clotting disorder ([place])   . H/O mechanical aortic valve replacement 03/25/2010   a. #27 Carbomed[employer] mechanical valve  . Hypercholesterolemia   . Renal infarct [name]) 2017   Multiple right renal infarcts, likely embolic.  . Stroke ([place])   . TIA (transient ischemic attack) 05/2014    Patient Active Problem List   Diagnosis Date Noted  . History of aortic stenosis 06/05/2018  . Chronic gout without tophus 04/03/2018  . Valvular heart disease 01/17/2018  . Morbid obesity ([place]) 01/17/2018   . Chronic systolic heart failure ([place]) 09/27/2016  . Renal infarct ([place]) 05/01/2016  . Diabetes mellitus type 2, diet-controlled ([place]) 04/19/2016  . Chronic bilateral low back pain without sciatica 04/19/2016  . Thoracic ascending aortic aneurysm ([place]) 03/16/2016  . Aortic atherosclerosis ([place]) 03/16/2016  . Tobacco abuse 03/14/2016  . NICM (nonischemic cardiomyopathy) ([place]) 03/02/2016  . COPD (chronic obstructive pulmonary disease) ([place]) 09/09/2015  . H/O mechanical aortic valve replacement 02/09/2015  . Essential hypertension 02/09/2015  . Hyperlipidemia 02/09/2015    Past Surgical History:  Procedure Laterality Date  . AORTIC VALVE REPLACEMENT    . CARDIAC CATHETERIZATION  03/21/2010   No significant CAD. Severe aortic stenosis. Severely elevated left and right heart filling pressures.  Marland Kitchen CARDIAC SURGERY  2009   CHF  . CARPAL TUNNEL RELEASE Left 2005  . TONSILLECTOMY  1962    Prior to Admission medications   Medication Sig Start Date End Date Taking? Authorizing Provider  albuterol (PROVENTIL HFA;VENTOLIN HFA) 108 (90 Base) MCG/ACT inhaler Inhale 2 puffs into the lungs every 6 (six) hours as needed for wheezing or shortness of breath. 09/25/18   [name], [name], DO  albuterol (PROVENTIL) (2.5 MG/3ML) 0.083% nebulizer solution Take 3 mLs (2.5 mg total) by nebulization every 6 (six) hours as needed for wheezing or shortness of breath. 01/28/18   Park Liter P, DO  allopurinol (ZYLOPRIM) 100 MG tablet Take 1 tablet by mouth once daily 10/05/18   Park Liter P, DO  aspirin 81  MG chewable tablet Chew 81 mg by mouth daily.      [provider]  cyclobenzaprine (FLEXERIL) 10 MG tablet TAKE 1 TABLET BY MOUTH TWICE DAILY AS NEEDED FOR  MUSCLE  SPASM 10/05/18   Wynetta Emery, [name], DO  Fluticasone-Salmeterol (ADVAIR) 250-50 MCG/DOSE AEPB Inhale 1 puff into the lungs 2 (two) times daily as needed.    [provider]  furosemide (LASIX) 40 MG tablet Take 1 tablet by mouth  once daily 10/07/18   End, Harrell Gave, MD  lisinopril (PRINIVIL,ZESTRIL) 5 MG tablet Take 1 tablet (5 mg total) by mouth daily. 06/05/18   End, Harrell Gave, MD  metoprolol succinate (TOPROL-XL) 100 MG 24 hr tablet TAKE 1 TABLET BY MOUTH ONCE DAILY WITH  OR  IMMEDIATELY  FOLLOWING  A  MEAL 10/07/18   End, Harrell Gave, MD  simvastatin (ZOCOR) 40 MG tablet Take 1 tablet (40 mg total) by mouth at bedtime. 01/16/18   End, Harrell Gave, MD  spironolactone (ALDACTONE) 25 MG tablet Take 1/2 (one-half) tablet by mouth once daily 10/07/18   Rise Mu, [name]  tiotropium (SPIRIVA) 18 MCG inhalation capsule Place 18 mcg into inhaler and inhale daily as needed.    [provider]  VENTOLIN HFA 108 (90 Base) MCG/ACT inhaler INHALE 2 PUFFS BY MOUTH EVERY 6 HOURS AS NEEDED FOR WHEEZING AND OR SHORTNESS OF BREATH 01/25/18   Park Liter P, DO  warfarin (COUMADIN) 4 MG tablet Take 1 tablet (4 mg total) by mouth daily at 6 PM. Take as directed 06/06/18   Marnee Guarneri T, NP  warfarin (COUMADIN) 7.5 MG tablet Take as directed    [provider]    No Known Allergies  Family History  Problem Relation Age of Onset  . Arthritis Mother   . Dementia Mother   . Colon cancer Mother   . Arthritis Father   . Diabetes Father   . Stroke Father   . Colon cancer Father   . Heart attack Brother   . Breast cancer Sister   . Seizures Sister   . Cancer Brother        brain  . Heart disease Brother   . Heart attack Brother     Social History Social History   Tobacco Use  . Smoking status: Current Every Day Smoker    Packs/day: 0.50    Years: 46.00    Pack years: 23.00    Types: Cigarettes    Start date: 07/16/2017  . Smokeless tobacco: Never Used  . Tobacco comment: 10 cigarettes a day  Substance Use Topics  . Alcohol use: No    Alcohol/week: 0.0 standard drinks  . Drug use: No    Review of Systems Constitutional: Negative for fever. ENT: Negative for recent  illness/congestion Cardiovascular: Negative for chest pain. Respiratory: Positive shortness of breath with exertion. Gastrointestinal: Negative for abdominal pain Musculoskeletal: Positive for leg swelling Neurological: Negative for headache All other ROS negative  ____________________________________________   PHYSICAL EXAM:  VITAL SIGNS: ED Triage Vitals  Enc Vitals Group     BP 11/04/18 1800 137/90     Pulse Rate 11/04/18 1755 91     Resp 11/04/18 1755 20     Temp 11/04/18 1755 98.4 F (36.9 C)     Temp Source 11/04/18 1755 Oral     SpO2 11/04/18 1755 95 %     Weight 11/04/18 1756 (!) 342 lb (155.1 kg)     Height 11/04/18 1756 5\' 6"  (1.676 m)  Head Circumference --      Peak Flow --      Pain Score 11/04/18 1756 0     Pain Loc --      Pain Edu? --      Excl. in [place]? --     Constitutional: Alert and oriented. Well appearing and in no distress. Eyes: Normal exam ENT      Head: Normocephalic and atraumatic.      Mouth/Throat: Mucous membranes are moist. Cardiovascular: Normal rate, regular rhythm.  Respiratory: Normal respiratory effort without tachypnea nor retractions. Breath sounds are clear Gastrointestinal: Soft and nontender. No distention.  Obese Musculoskeletal: Nontender with normal range of motion in all extremities.  Mild lower extremity and equal bilaterally. Neurologic:  Normal speech and language. No gross focal neurologic deficits  Skin:  Skin is warm, dry and intact.  Psychiatric: Mood and affect are normal.   ____________________________________________    EKG  EKG viewed and interpreted by myself shows a sinus rhythm 87 bpm with occasional PVCs.  Patient has a slightly widened QRS, normal axis, normal intervals.  Nonspecific ST changes without ST elevation.  ____________________________________________    RADIOLOGY  Chest x-ray shows interstitial pulmonary edema  ____________________________________________   INITIAL IMPRESSION /  ASSESSMENT AND PLAN / ED COURSE  Pertinent labs & imaging results that were available during my care of the patient were reviewed by me and considered in my medical decision making (see chart for details).   Patient presents emergency department for shortness of breath with exertion increased lower extremity edema as well as weight gain over the past 1 week.  Overall the patient appears well, 95% room air saturation.  We will check labs, chest x-ray, EKG and continue to closely monitor.  Patient is EKG shows PVCs but otherwise no acute abnormalities.  Chest x-ray does show interstitial edema.  Patient's clinical work-up and evaluation most consistent with CHF exacerbation.  Patient takes 40 mg of Lasix daily.  Patient strongly wishes to be discharged home.  We attempted ambulation with pulse oximetry and the patient did well, satting around 95% or greater during ambulation.  We will discharge patient home, doubling his Lasix from 40 mg daily to 80 mg daily for the next 7 days and have the patient follow-up with his doctor this week.  Patient agreeable plan of care.  I discussed strict return precautions to which the patient is agreeable.  Averey Trompeter was evaluated in Emergency Department on 11/04/2018 for the symptoms described in the history of present illness. He was evaluated in the context of the global COVID-19 pandemic, which necessitated consideration that the patient might be at risk for infection with the SARS-CoV-2 virus that causes COVID-19. Institutional protocols and algorithms that pertain to the evaluation of patients at risk for COVID-19 are in a state of rapid change based on information released by regulatory bodies including the CDC and federal and state organizations. These policies and algorithms were followed during the patient's care in the ED.  ____________________________________________   FINAL CLINICAL IMPRESSION(S) / ED DIAGNOSES  Dyspnea CHF exacerbation    Harvest Dark, MD 11/04/18 2214

## 2018-11-04 NOTE — Progress Notes (Signed)
BP (!) 105/57   Pulse 85   Temp 97.9 F (36.6 C) (Oral)   Ht 5\' 6"  (1.676 m)   Wt (!) 343 lb (155.6 kg)   SpO2 94%   BMI 55.36 kg/m    Subjective:    Patient ID: Roberto [name], male    DOB: 10/19/1955, 63 y.o.   MRN: [id number]  HPI: Roberto [name] is a 63 y.o. male  Chief Complaint  Patient presents with  . Leg Swelling    bilateral color changing, thightness, first noticed yesterday  . Shortness of Breath    x about a week, first started with cough about 2 months ago  . Cough   COUGH Endorses worsening cough, which started 2 months ago and is now worsening.  Productive with whitish phlegm at times.  Has also noted worsening SOB over past few days, noted SOB walking from front office area to the exam room and slightly with talking.  His significant other does report patient has had increased edema, noted over weekend with change in color bilateral legs. His significant other reports he does not always follow low sodium diet.  Has history of treatment for cellulitis in past, but his significant other reports no warmth to extremities.  Patient reports feeling increased tightness in his abdominal area over past few days.  Of note has dx of systolic CHF and cardiomyopathy with last echo 2017 EF 40-45% + has mechanical AVR due to h/o aortic stenosis.  Followed by cardiology and last saw Dr. Saunders Revel 10/21/2018.  Weight on 10/21/2018 was 315 at this time it is 343 lbs, showing a significant gain in past 2 weeks.  Discussed at length with significant other and patient.  Both are in agreeance to going to ER for further work-up and evaluation. Duration: weeks Circumstances of initial development of cough: URI Cough severity: mild Cough description: productive Aggravating factors:  nothing Alleviating factors: nothing Status:  fluctuating Treatments attempted: antibiotics Wheezing: no Shortness of breath: yes Chest pain: no Chest tightness:no Nasal congestion: no Runny nose: no  Postnasal drip: no Frequent throat clearing or swallowing: no Hemoptysis: no Fevers: no Night sweats: no Weight loss: no Heartburn: no Recent foreign travel: no Tuberculosis contacts: no  Relevant past medical, surgical, family and social history reviewed and updated as indicated. Interim medical history since our last visit reviewed. Allergies and medications reviewed and updated.  Review of Systems  Constitutional: Positive for fatigue. Negative for activity change and fever.  HENT: Negative for congestion, ear pain, rhinorrhea, sinus pressure, sinus pain and sore throat.   Eyes: Negative for pain, discharge and redness.  Respiratory: Positive for cough and shortness of breath. Negative for chest tightness and wheezing.   Cardiovascular: Positive for leg swelling. Negative for chest pain and palpitations.  Gastrointestinal: Positive for abdominal distention. Negative for abdominal pain, constipation, diarrhea, nausea and vomiting.  Endocrine: Negative for cold intolerance, heat intolerance, polydipsia, polyphagia and polyuria.  Genitourinary: Negative for difficulty urinating.  Musculoskeletal: Negative for back pain, myalgias and neck pain.  Skin: Negative.   Allergic/Immunologic: Negative.   Neurological: Negative for dizziness, syncope, numbness and headaches.  Hematological: Negative.   Psychiatric/Behavioral: Negative for behavioral problems.    Per HPI unless specifically indicated above     Objective:    BP (!) 105/57   Pulse 85   Temp 97.9 F (36.6 C) (Oral)   Ht 5\' 6"  (1.676 m)   Wt (!) 343 lb (155.6 kg)   SpO2 94%   BMI 55.36  kg/m   Wt Readings from Last 3 Encounters:  11/04/18 (!) 343 lb (155.6 kg)  10/21/18 (!) 315 lb (142.9 kg)  09/10/18 (!) 330 lb 4 oz (149.8 kg)    Physical Exam Vitals signs and nursing note reviewed.  Constitutional:      General: He is awake.     Appearance: He is well-developed. He is morbidly obese. He is ill-appearing.   HENT:     Head: Normocephalic and atraumatic.     Right Ear: Hearing normal. No drainage.     Left Ear: Hearing normal. No drainage.     Mouth/Throat:     Pharynx: Uvula midline.  Eyes:     General: Lids are normal.        Right eye: No discharge.        Left eye: No discharge.     Conjunctiva/sclera: Conjunctivae normal.     Pupils: Pupils are equal, round, and reactive to light.  Neck:     Musculoskeletal: Normal range of motion and neck supple.     Thyroid: No thyromegaly.     Vascular: No carotid bruit or JVD.     Trachea: Trachea normal.  Cardiovascular:     Rate and Rhythm: Normal rate and regular rhythm.     Heart sounds: Normal heart sounds, S1 normal and S2 normal. No murmur. No gallop.      Comments: Rubor present bilateral lower extremity with slight deep reddish discoloration to all toes bilateral feet Pulmonary:     Effort: Pulmonary effort is normal.     Breath sounds: Normal breath sounds.     Comments: Diminished throughout.  Noted SOB with walking from exam room to weight scale and occasionally noted with talking.   Abdominal:     General: Bowel sounds are normal.     Palpations: Abdomen is soft. There is no hepatomegaly or splenomegaly.     Comments: Mildly distended and morbidly obese abdomen.  Musculoskeletal: Normal range of motion.     Right lower leg: Edema (3+) present.     Left lower leg: Edema (3+) present.  Skin:    General: Skin is warm and dry.     Capillary Refill: Capillary refill takes less than 2 seconds.     Findings: No rash.  Neurological:     Mental Status: He is alert and oriented to person, place, and time.     Deep Tendon Reflexes: Reflexes are normal and symmetric.  Psychiatric:        Mood and Affect: Mood normal.        Behavior: Behavior normal. Behavior is cooperative.        Thought Content: Thought content normal.        Judgment: Judgment normal.     Results for orders placed or performed in visit on 10/22/18   Protime-INR  Result Value Ref Range   INR        Assessment & Plan:   Problem List Items Addressed This Visit      Cardiovascular and Mediastinum   Chronic systolic heart failure ([place]) - Primary    Chronic, ongoing.  Due to current symptoms and significant weight gain in past 2 weeks have recommended further work-up be performed at the ER.  Both patient and significant other are in agreeance with this, his significant other reports that was originally what they had planned on doing but wished further guidance first.  Plan to go to ER straight from this visit to obtain further cardiac  work-up.          Follow up plan: Return if symptoms worsen or fail to improve.

## 2018-11-04 NOTE — Telephone Encounter (Signed)
Received documents from call center.   Zaydrian Rothman's wife [name] called stated Shelton's feet and lower extremeties were purple, warm to touch and skin feels tight and swollen. He was SOB when walking and standing. Given advice to go to ED but they were undecided. And according to Epic and Care Everywhere, no documentation of patient going to ED.  Spoke with Dr. Wynetta Emery as she is working from home. Advised me to call patient or his wife and tell them they needed to be seen today or go to ED.  Tried calling, no answer and LVM for them to return call as soon as possible. Will try again later if no call back.  Routing to provider as Juluis Rainier.

## 2018-11-04 NOTE — Telephone Encounter (Signed)
Left message on machine for pt to return call to the office.  

## 2018-11-04 NOTE — Telephone Encounter (Signed)
Needs to be seen today in office or in urgent care/ER

## 2018-11-04 NOTE — ED Notes (Signed)
Walked patient with pulse oximeter on up the hall once and back. O2 reading at 99% when arrive back to room.AS

## 2018-11-04 NOTE — Telephone Encounter (Signed)
Patient coming in office to see Jolene today at 2:45PM.

## 2018-11-04 NOTE — Assessment & Plan Note (Signed)
Chronic, ongoing.  Due to current symptoms and significant weight gain in past 2 weeks have recommended further work-up be performed at the ER.  Both patient and significant other are in agreeance with this, his significant other reports that was originally what they had planned on doing but wished further guidance first.  Plan to go to ER straight from this visit to obtain further cardiac work-up.

## 2018-11-04 NOTE — Patient Instructions (Signed)

## 2018-11-04 NOTE — Discharge Instructions (Addendum)
As we discussed please increase your Lasix/furosemide from 40 mg daily to 80 mg daily for the next 7 days.  Please follow-up with your primary care doctor later this week for recheck/reevaluation.  Return to the emergency department for any worsening shortness of breath, development of chest pain, fever, or any other symptom personally concerning to yourself.

## 2018-11-05 ENCOUNTER — Encounter: Payer: Self-pay | Admitting: Family Medicine

## 2018-11-05 LAB — POCT INR: INR: 1.8 — AB (ref 2.0–3.0)

## 2018-11-07 ENCOUNTER — Telehealth: Payer: Self-pay | Admitting: Family Medicine

## 2018-11-07 NOTE — Telephone Encounter (Signed)
Please check with Roberto [name] about when he ate his salad and had he just taken his 4mg  or his 7.5mg ?  I'm pretty sure someone was trying to call him yesterday, I'm sorry if that was missed.

## 2018-11-07 NOTE — Telephone Encounter (Signed)
This is likely a normal fluctuation from his dosing. Avoid salad this week and recheck Tuesday as scheduled.

## 2018-11-07 NOTE — Telephone Encounter (Signed)
Patient notified and verbalized understanding. 

## 2018-11-07 NOTE — Telephone Encounter (Signed)
Roberto [name] is very concerned that no one called them regarding his INR being 1.8 and she states she called on Tuesday regarding this.  She feels someone needs to please call her back to discuss.

## 2018-11-07 NOTE — Telephone Encounter (Signed)
Called and spoke to Roberto [name], she states that he has not ate a salad this week. She states that he took 7.5 mg Sunday and the 4 mg on Monday and Tuesday.

## 2018-11-08 ENCOUNTER — Encounter: Payer: Self-pay | Admitting: Family Medicine

## 2018-11-08 ENCOUNTER — Ambulatory Visit (INDEPENDENT_AMBULATORY_CARE_PROVIDER_SITE_OTHER): Payer: Medicare Other | Admitting: Family Medicine

## 2018-11-08 ENCOUNTER — Other Ambulatory Visit: Payer: Self-pay

## 2018-11-08 VITALS — BP 94/65 | HR 96 | Temp 98.0°F | Wt 338.4 lb

## 2018-11-08 DIAGNOSIS — M545 Low back pain, unspecified: Secondary | ICD-10-CM

## 2018-11-08 DIAGNOSIS — Z952 Presence of prosthetic heart valve: Secondary | ICD-10-CM

## 2018-11-08 DIAGNOSIS — I5033 Acute on chronic diastolic (congestive) heart failure: Secondary | ICD-10-CM

## 2018-11-08 DIAGNOSIS — G8929 Other chronic pain: Secondary | ICD-10-CM

## 2018-11-08 LAB — COAGUCHEK XS/INR WAIVED
INR: 2.6 — ABNORMAL HIGH (ref 0.9–1.1)
Prothrombin Time: 31 s

## 2018-11-08 MED ORDER — NORTRIPTYLINE HCL 10 MG PO CAPS: 10.0000 mg | ORAL_CAPSULE | Freq: Every day | ORAL | 3 refills | Status: DC

## 2018-11-08 NOTE — Progress Notes (Signed)
BP 94/65   Pulse 96   Temp 98 F (36.7 C) (Oral)   Wt (!) 338 lb 6.4 oz (153.5 kg)   SpO2 93%   BMI 54.62 kg/m    Subjective:    Patient ID: Roberto [name], male    DOB: January 14, 1956, 63 y.o.   MRN: [id number]  HPI: Roberto [name] is a 63 y.o. male  Chief Complaint  Patient presents with  . Follow-up   ER FOLLOW UP Time since discharge: 4 days Hospital/facility: [facility] Diagnosis: CHF exacerbation Procedures/tests: CXR, EKG, labs Consultants: None New medications: none Discharge instructions: increase lasix to 80mg  daily and follow up here   Status: better- not 100%, but doing much better  Coumadin Management. Had home INR of 1.8 on Tuesday after 2 days of 4mg . Has been nervous about it. Rechecking today. The expected duration of coumadin treatment is lifelong The reason for anticoagulation is  mechanical heart valve.  Present Coumadin dose: 4, 4, 7.5 alternating Goal: 2.5-3.5  Excessive bruising: no Nose bleeding: no Rectal bleeding: no Prolonged menstrual cycles: N/A Eating diet with consistent amounts of foods containing Vitamin K:yes Any recent antibiotic use? no  Has not been feeling great. He notes that his legs are aching after sitting for any period of time. He notes that he is not sure if it's that they're numb or if it is just from the pain. No changes in the pain as the swelling has gone down. He notes that the pain goes down the back and outside of his right leg down to his knee and below. It is numb and aching. It is worse with movement and sitting for long periods of time. Nothing really seems to make it better.   Breathing has been doing a lot better. He has been taking his 80mg  of lasix and notes that his breathing has improved and his swelling has gone down. He is still not 100%. He is otherwise feeling well with no other concerns or complaints at this time.   Relevant past medical, surgical, family and social history reviewed and updated as indicated.  Interim medical history since our last visit reviewed. Allergies and medications reviewed and updated.  Review of Systems  Constitutional: Positive for fatigue. Negative for activity change, appetite change, chills, diaphoresis, fever and unexpected weight change.  HENT: Negative.   Eyes: Negative.   Respiratory: Positive for shortness of breath. Negative for apnea, cough, choking, chest tightness, wheezing and stridor.   Cardiovascular: Positive for leg swelling. Negative for chest pain and palpitations.  Gastrointestinal: Negative.   Musculoskeletal: Positive for back pain and myalgias. Negative for arthralgias, gait problem, joint swelling, neck pain and neck stiffness.  Skin: Negative.   Neurological: Negative.   Psychiatric/Behavioral: Negative.     Per HPI unless specifically indicated above     Objective:    BP 94/65   Pulse 96   Temp 98 F (36.7 C) (Oral)   Wt (!) 338 lb 6.4 oz (153.5 kg)   SpO2 93%   BMI 54.62 kg/m   Wt Readings from Last 3 Encounters:  11/12/18 (!) 341 lb 6.4 oz (154.9 kg)  11/08/18 (!) 338 lb 6.4 oz (153.5 kg)  11/04/18 (!) 342 lb (155.1 kg)    Physical Exam Vitals signs and nursing note reviewed.  Constitutional:      General: He is not in acute distress.    Appearance: Normal appearance. He is not ill-appearing, toxic-appearing or diaphoretic.  HENT:     Head: Normocephalic and  atraumatic.     Right Ear: External ear normal.     Left Ear: External ear normal.     Nose: Nose normal.     Mouth/Throat:     Mouth: Mucous membranes are moist.     Pharynx: Oropharynx is clear.  Eyes:     General: No scleral icterus.       Right eye: No discharge.        Left eye: No discharge.     Extraocular Movements: Extraocular movements intact.     Conjunctiva/sclera: Conjunctivae normal.     Pupils: Pupils are equal, round, and reactive to light.  Neck:     Musculoskeletal: Normal range of motion and neck supple.  Cardiovascular:     Rate and  Rhythm: Normal rate and regular rhythm.     Pulses: Normal pulses.     Heart sounds: Murmur present. No friction rub. No gallop.   Pulmonary:     Effort: Pulmonary effort is normal. No respiratory distress.     Breath sounds: Normal breath sounds. No stridor. No wheezing, rhonchi or rales.  Chest:     Chest wall: No tenderness.  Musculoskeletal: Normal range of motion.        General: Swelling (1+ bilaterally) present.  Skin:    General: Skin is warm and dry.     Capillary Refill: Capillary refill takes less than 2 seconds.     Coloration: Skin is not jaundiced or pale.     Findings: No bruising, erythema, lesion or rash.  Neurological:     General: No focal deficit present.     Mental Status: He is alert and oriented to person, place, and time. Mental status is at baseline.  Psychiatric:        Mood and Affect: Mood normal.        Behavior: Behavior normal.        Thought Content: Thought content normal.        Judgment: Judgment normal.     Results for orders placed or performed in visit on 11/08/18  CoaguChek XS/INR Waived  Result Value Ref Range   INR 2.6 (H) 0.9 - 1.1   Prothrombin Time 31.0 sec      Assessment & Plan:   Problem List Items Addressed This Visit      Other   H/O mechanical aortic valve replacement    Stable. Continue current regimen. Continue to monitor. Call with any concerns.       Relevant Orders   CoaguChek XS/INR Waived (Completed)   Chronic bilateral low back pain without sciatica    In exacerbation. Will start exercises and flexeril. Call if not getting better or getting worse.        Other Visit Diagnoses    Acute on chronic diastolic CHF (congestive heart failure) ([place])    -  Primary   Improving, but still not great. Continue BID lasix until follow up on Tuesday.        Follow up plan: Return Tuesday-, for follow up CHF.

## 2018-11-08 NOTE — Patient Instructions (Signed)
Sciatica Rehab  Ask your health care provider which exercises are safe for you. Do exercises exactly as told by your health care provider and adjust them as directed. It is normal to feel mild stretching, pulling, tightness, or discomfort as you do these exercises, but you should stop right away if you feel sudden pain or your pain gets worse.Do not begin these exercises until told by your health care provider.  Stretching and range of motion exercises  These exercises warm up your muscles and joints and improve the movement and flexibility of your hips and your back. These exercises also help to relieve pain, numbness, and tingling.  Exercise A: Sciatic nerve glide  1. Sit in a chair with your head facing down toward your chest. Place your hands behind your back. Let your shoulders slump forward.  2. Slowly straighten one of your knees while you tilt your head back as if you are looking toward the ceiling. Only straighten your leg as far as you can without making your symptoms worse.  3. Hold for __________ seconds.  4. Slowly return to the starting position.  5. Repeat with your other leg.  Repeat __________ times. Complete this exercise __________ times a day.  Exercise B: Knee to chest with hip adduction and internal rotation    1. Lie on your back on a firm surface with both legs straight.  2. Bend one of your knees and move it up toward your chest until you feel a gentle stretch in your lower back and buttock. Then, move your knee toward the shoulder that is on the opposite side from your leg.  ? Hold your leg in this position by holding onto the front of your knee.  3. Hold for __________ seconds.  4. Slowly return to the starting position.  5. Repeat with your other leg.  Repeat __________ times. Complete this exercise __________ times a day.  Exercise C: Prone extension on elbows    1. Lie on your abdomen on a firm surface. A bed may be too soft for this exercise.  2. Prop yourself up on your  elbows.  3. Use your arms to help lift your chest up until you feel a gentle stretch in your abdomen and your lower back.  ? This will place some of your body weight on your elbows. If this is uncomfortable, try stacking pillows under your chest.  ? Your hips should stay down, against the surface that you are lying on. Keep your hip and back muscles relaxed.  4. Hold for __________ seconds.  5. Slowly relax your upper body and return to the starting position.  Repeat __________ times. Complete this exercise __________ times a day.  Strengthening exercises  These exercises build strength and endurance in your back. Endurance is the ability to use your muscles for a long time, even after they get tired.  Exercise D: Pelvic tilt  1. Lie on your back on a firm surface. Bend your knees and keep your feet flat.  2. Tense your abdominal muscles. Tip your pelvis up toward the ceiling and flatten your lower back into the floor.  ? To help with this exercise, you may place a small towel under your lower back and try to push your back into the towel.  3. Hold for __________ seconds.  4. Let your muscles relax completely before you repeat this exercise.  Repeat __________ times. Complete this exercise __________ times a day.  Exercise E: Alternating arm and leg raises      1. Get on your hands and knees on a firm surface. If you are on a hard floor, you may want to use padding to cushion your knees, such as an exercise mat.  2. Line up your arms and legs. Your hands should be below your shoulders, and your knees should be below your hips.  3. Lift your left leg behind you. At the same time, raise your right arm and straighten it in front of you.  ? Do not lift your leg higher than your hip.  ? Do not lift your arm higher than your shoulder.  ? Keep your abdominal and back muscles tight.  ? Keep your hips facing the ground.  ? Do not arch your back.  ? Keep your balance carefully, and do not hold your breath.  4. Hold for  __________ seconds.  5. Slowly return to the starting position and repeat with your right leg and your left arm.  Repeat __________ times. Complete this exercise __________ times a day.  Posture and body mechanics    Body mechanics refers to the movements and positions of your body while you do your daily activities. Posture is part of body mechanics. Good posture and healthy body mechanics can help to relieve stress in your body's tissues and joints. Good posture means that your spine is in its natural S-curve position (your spine is neutral), your shoulders are pulled back slightly, and your head is not tipped forward. The following are general guidelines for applying improved posture and body mechanics to your everyday activities.  Standing     When standing, keep your spine neutral and your feet about hip-width apart. Keep a slight bend in your knees. Your ears, shoulders, and hips should line up.   When you do a task in which you stand in one place for a long time, place one foot up on a stable object that is 2-4 inches (5-10 cm) high, such as a footstool. This helps keep your spine neutral.  Sitting     When sitting, keep your spine neutral and keep your feet flat on the floor. Use a footrest, if necessary, and keep your thighs parallel to the floor. Avoid rounding your shoulders, and avoid tilting your head forward.   When working at a desk or a computer, keep your desk at a height where your hands are slightly lower than your elbows. Slide your chair under your desk so you are close enough to maintain good posture.   When working at a computer, place your monitor at a height where you are looking straight ahead and you do not have to tilt your head forward or downward to look at the screen.  Resting     When lying down and resting, avoid positions that are most painful for you.   If you have pain with activities such as sitting, bending, stooping, or squatting (flexion-based activities), lie in a  position in which your body does not bend very much. For example, avoid curling up on your side with your arms and knees near your chest (fetal position).   If you have pain with activities such as standing for a long time or reaching with your arms (extension-based activities), lie with your spine in a neutral position and bend your knees slightly. Try the following positions:  ? Lying on your side with a pillow between your knees.  ? Lying on your back with a pillow under your knees.  Lifting     When lifting   objects, keep your feet at least shoulder-width apart and tighten your abdominal muscles.   Bend your knees and hips and keep your spine neutral. It is important to lift using the strength of your legs, not your back. Do not lock your knees straight out.   Always ask for help to lift heavy or awkward objects.  This information is not intended to replace advice given to you by your health care provider. Make sure you discuss any questions you have with your health care provider.  Document Released: 06/19/2005 Document Revised: 02/24/2016 Document Reviewed: 03/05/2015  Elsevier Interactive Patient Education  2019 [employer]

## 2018-11-12 ENCOUNTER — Ambulatory Visit (INDEPENDENT_AMBULATORY_CARE_PROVIDER_SITE_OTHER): Payer: Medicare Other | Admitting: Family Medicine

## 2018-11-12 ENCOUNTER — Encounter: Payer: Self-pay | Admitting: Family Medicine

## 2018-11-12 ENCOUNTER — Other Ambulatory Visit: Payer: Self-pay

## 2018-11-12 VITALS — BP 107/70 | HR 93 | Temp 98.7°F | Wt 341.4 lb

## 2018-11-12 DIAGNOSIS — M5441 Lumbago with sciatica, right side: Secondary | ICD-10-CM

## 2018-11-12 DIAGNOSIS — I509 Heart failure, unspecified: Secondary | ICD-10-CM | POA: Diagnosis not present

## 2018-11-12 LAB — POCT INR: INR: 2 (ref 2.0–3.0)

## 2018-11-12 NOTE — Assessment & Plan Note (Signed)
Continue flexeril. If not getting better by Friday- will get x-rays.

## 2018-11-12 NOTE — Progress Notes (Signed)
BP 107/70   Pulse 93   Temp 98.7 F (37.1 C) (Oral)   Wt (!) 341 lb 6.4 oz (154.9 kg)   SpO2 95%   BMI 55.10 kg/m    Subjective:    Patient ID: Roberto [name], male    DOB: 07-Mar-1956, 63 y.o.   MRN: [id number]  HPI: Roberto [name] is a 63 y.o. male  Chief Complaint  Patient presents with  . Follow-up   Was only able to start his flexeril last night. Continues with a lot of pain in his back going down his R leg. Not significantly better. He notes that it's worse with movement and better with with rest- but worse with prolonged sitting. Aching and sore with shooting pains down his R leg. It's been going on for a while, but has been getting worse.   His breathing is doing better. Continues on the BID lasix. Weight is up a little bit. Swelling is down. Breathing is feeling a lot better. He is otherwise feeling well with no other concerns or complaints at this time.   Relevant past medical, surgical, family and social history reviewed and updated as indicated. Interim medical history since our last visit reviewed. Allergies and medications reviewed and updated.  Review of Systems  Constitutional: Negative.   HENT: Negative.   Respiratory: Negative for apnea, cough, choking, chest tightness, shortness of breath, wheezing and stridor.   Cardiovascular: Positive for leg swelling. Negative for chest pain and palpitations.  Gastrointestinal: Negative.   Musculoskeletal: Positive for back pain and myalgias. Negative for arthralgias, gait problem, joint swelling, neck pain and neck stiffness.  Skin: Negative.   Neurological: Negative.   Psychiatric/Behavioral: Negative.     Per HPI unless specifically indicated above     Objective:    BP 107/70   Pulse 93   Temp 98.7 F (37.1 C) (Oral)   Wt (!) 341 lb 6.4 oz (154.9 kg)   SpO2 95%   BMI 55.10 kg/m   Wt Readings from Last 3 Encounters:  11/12/18 (!) 341 lb 6.4 oz (154.9 kg)  11/08/18 (!) 338 lb 6.4 oz (153.5 kg)   11/04/18 (!) 342 lb (155.1 kg)    Physical Exam Vitals signs and nursing note reviewed.  Constitutional:      General: He is not in acute distress.    Appearance: Normal appearance. He is not ill-appearing, toxic-appearing or diaphoretic.  HENT:     Head: Normocephalic and atraumatic.     Right Ear: External ear normal.     Left Ear: External ear normal.     Nose: Nose normal.     Mouth/Throat:     Mouth: Mucous membranes are moist.     Pharynx: Oropharynx is clear.  Eyes:     General: No scleral icterus.       Right eye: No discharge.        Left eye: No discharge.     Extraocular Movements: Extraocular movements intact.     Conjunctiva/sclera: Conjunctivae normal.     Pupils: Pupils are equal, round, and reactive to light.  Neck:     Musculoskeletal: Normal range of motion and neck supple.  Cardiovascular:     Rate and Rhythm: Normal rate and regular rhythm.     Pulses: Normal pulses.     Heart sounds: No murmur. No friction rub. No gallop.      Comments: + click Pulmonary:     Effort: Pulmonary effort is normal. No respiratory distress.  Breath sounds: Normal breath sounds. No stridor. No wheezing, rhonchi or rales.  Chest:     Chest wall: No tenderness.  Musculoskeletal: Normal range of motion.        General: Swelling (1+ edema bilaterally) present.  Skin:    General: Skin is warm and dry.     Capillary Refill: Capillary refill takes less than 2 seconds.     Coloration: Skin is not jaundiced or pale.     Findings: No bruising, erythema, lesion or rash.  Neurological:     General: No focal deficit present.     Mental Status: He is alert and oriented to person, place, and time. Mental status is at baseline.  Psychiatric:        Mood and Affect: Mood normal.        Behavior: Behavior normal.        Thought Content: Thought content normal.        Judgment: Judgment normal.     Results for orders placed or performed in visit on 11/08/18  CoaguChek XS/INR  Waived  Result Value Ref Range   INR 2.6 (H) 0.9 - 1.1   Prothrombin Time 31.0 sec      Assessment & Plan:   Problem List Items Addressed This Visit      Other   Chronic bilateral low back pain without sciatica    Continue flexeril. If not getting better by Friday- will get x-rays.       Other Visit Diagnoses    Acute on chronic congestive heart failure, unspecified heart failure type ([place])    -  Primary   Continue on BID lasix for the next couple of days- checking kidney function. Recheck 2-3 days.    Relevant Orders   Basic metabolic panel       Follow up plan: Return Friday, for follow up CHF/back pain.

## 2018-11-13 ENCOUNTER — Encounter: Payer: Self-pay | Admitting: Family Medicine

## 2018-11-13 LAB — BASIC METABOLIC PANEL
BUN/Creatinine Ratio: 32 — ABNORMAL HIGH (ref 10–24)
BUN: 32 mg/dL — ABNORMAL HIGH (ref 8–27)
CO2: 22 mmol/L (ref 20–29)
Calcium: 10 mg/dL (ref 8.6–10.2)
Chloride: 98 mmol/L (ref 96–106)
Creatinine, Ser: 1.01 mg/dL (ref 0.76–1.27)
GFR calc Af Amer: 90 mL/min/{1.73_m2} (ref >59)
GFR calc non Af Amer: 78 mL/min/{1.73_m2} (ref >59)
Glucose: 103 mg/dL — ABNORMAL HIGH (ref 65–99)
Potassium: 4.9 mmol/L (ref 3.5–5.2)
Sodium: 136 mmol/L (ref 134–144)

## 2018-11-13 NOTE — Assessment & Plan Note (Signed)
Stable. Continue current regimen. Continue to monitor. Call with any concerns.  

## 2018-11-13 NOTE — Progress Notes (Signed)
5.8.20

## 2018-11-13 NOTE — Assessment & Plan Note (Signed)
In exacerbation. Will start exercises and flexeril. Call if not getting better or getting worse.

## 2018-11-15 ENCOUNTER — Other Ambulatory Visit: Payer: Self-pay

## 2018-11-15 ENCOUNTER — Encounter: Payer: Self-pay | Admitting: Family Medicine

## 2018-11-15 ENCOUNTER — Telehealth: Payer: Self-pay | Admitting: Family Medicine

## 2018-11-15 ENCOUNTER — Ambulatory Visit
Admission: RE | Admit: 2018-11-15 | Discharge: 2018-11-15 | Disposition: A | Discharge status: Home / Self Care | Payer: Medicare Other | Attending: Family Medicine | Admitting: Family Medicine

## 2018-11-15 ENCOUNTER — Ambulatory Visit
Admission: RE | Admit: 2018-11-15 | Discharge: 2018-11-15 | Disposition: A | Discharge status: Home / Self Care | Payer: Medicare Other | Source: Ambulatory Visit | Attending: Family Medicine | Admitting: Family Medicine

## 2018-11-15 ENCOUNTER — Ambulatory Visit (INDEPENDENT_AMBULATORY_CARE_PROVIDER_SITE_OTHER): Payer: Medicare Other | Admitting: Family Medicine

## 2018-11-15 VITALS — BP 118/68 | HR 72

## 2018-11-15 DIAGNOSIS — M5441 Lumbago with sciatica, right side: Secondary | ICD-10-CM

## 2018-11-15 DIAGNOSIS — M48062 Spinal stenosis, lumbar region with neurogenic claudication: Secondary | ICD-10-CM

## 2018-11-15 DIAGNOSIS — M5136 Other intervertebral disc degeneration, lumbar region: Secondary | ICD-10-CM | POA: Diagnosis not present

## 2018-11-15 DIAGNOSIS — M160 Bilateral primary osteoarthritis of hip: Secondary | ICD-10-CM | POA: Diagnosis not present

## 2018-11-15 DIAGNOSIS — M4726 Other spondylosis with radiculopathy, lumbar region: Secondary | ICD-10-CM

## 2018-11-15 DIAGNOSIS — I509 Heart failure, unspecified: Secondary | ICD-10-CM | POA: Diagnosis not present

## 2018-11-15 DIAGNOSIS — M4805 Spinal stenosis, thoracolumbar region: Secondary | ICD-10-CM | POA: Diagnosis not present

## 2018-11-15 MED ORDER — PREDNISONE 50 MG PO TABS: 50.0000 mg | ORAL_TABLET | Freq: Every day | ORAL | 0 refills | Status: DC

## 2018-11-15 NOTE — Telephone Encounter (Signed)
Patient notified

## 2018-11-15 NOTE — Telephone Encounter (Addendum)
Please let him know that his back x-ray shows a lot of pretty bad arthritis. This is most likely what's causing his pain in his back and down his leg. He's got a little arthritis in his hip, but nothing terrible. I've sent him through a bit more prednisone to help with the pain and gotten him a referral over to the neurosurgeon to talk about options like shots etc. We can talk more about it on Thursday when I see him. Thanks!

## 2018-11-15 NOTE — Progress Notes (Signed)
BP 118/68   Pulse 72    Subjective:    Patient ID: Roberto [name], male    DOB: [birth year], 63 y.o.   MRN: [id number]  HPI: Roberto [name] is a 63 y.o. male  Chief Complaint  Patient presents with  . Follow-up   Roberto [name] presents today via video visit due to the COVID-19 pandemic. He notes that he is doing a bit better. He notes that the swelling is better, but not gone. He continues to have swelling in his legs, they are not hurting as much He does notes that his cough is better, but still there. He is not feeling short of breath, but hasn't been pushing it, mainly sitting on his couch. He is tolerating his medicine well and is not having any issues with them.  He notes that his back is absolutely not any better. He has been doing his stretches and taking his flexeril but continues with pain in his R low back that radiates down his R leg. Better with sitting, worse with movement. Some numbness. He is otherwise feeling well with no other concerns or complaints at this time.   Relevant past medical, surgical, family and social history reviewed and updated as indicated. Interim medical history since our last visit reviewed. Allergies and medications reviewed and updated.  Review of Systems  Constitutional: Negative.   HENT: Negative.   Respiratory: Positive for shortness of breath. Negative for apnea, cough, choking, chest tightness, wheezing and stridor.   Cardiovascular: Positive for leg swelling. Negative for chest pain and palpitations.  Gastrointestinal: Negative.   Musculoskeletal: Positive for back pain and myalgias. Negative for arthralgias, gait problem, joint swelling, neck pain and neck stiffness.  Skin: Negative.   Neurological: Negative.   Psychiatric/Behavioral: Negative.     Per HPI unless specifically indicated above     Objective:    BP 118/68   Pulse 72   Wt Readings from Last 3 Encounters:  11/12/18 (!) 341 lb 6.4 oz (154.9 kg)  11/08/18 (!) 338 lb 6.4 oz  (153.5 kg)  11/04/18 (!) 342 lb (155.1 kg)    Physical Exam Vitals signs and nursing note reviewed.  Constitutional:      General: He is not in acute distress.    Appearance: Normal appearance. He is not ill-appearing, toxic-appearing or diaphoretic.  HENT:     Head: Normocephalic and atraumatic.     Right Ear: External ear normal.     Left Ear: External ear normal.     Nose: Nose normal.     Mouth/Throat:     Mouth: Mucous membranes are moist.     Pharynx: Oropharynx is clear.  Eyes:     General: No scleral icterus.       Right eye: No discharge.        Left eye: No discharge.     Conjunctiva/sclera: Conjunctivae normal.     Pupils: Pupils are equal, round, and reactive to light.  Neck:     Musculoskeletal: Normal range of motion.  Pulmonary:     Effort: Pulmonary effort is normal. No respiratory distress.     Comments: Speaking in full sentences Musculoskeletal: Normal range of motion.  Skin:    Coloration: Skin is not jaundiced or pale.     Findings: No bruising, erythema, lesion or rash.  Neurological:     Mental Status: He is alert and oriented to person, place, and time. Mental status is at baseline.  Psychiatric:        Mood  and Affect: Mood normal.        Behavior: Behavior normal.        Thought Content: Thought content normal.        Judgment: Judgment normal.     Results for orders placed or performed in visit on 27/03/50  Basic metabolic panel  Result Value Ref Range   Glucose 103 (H) 65 - 99 mg/dL   BUN 32 (H) 8 - 27 mg/dL   Creatinine, Ser 1.01 0.76 - 1.27 mg/dL   GFR calc non Af Amer 78 >59 mL/min/1.73   GFR calc Af Amer 90 >59 mL/min/1.73   BUN/Creatinine Ratio 32 (H) 10 - 24   Sodium 136 134 - 144 mmol/L   Potassium 4.9 3.5 - 5.2 mmol/L   Chloride 98 96 - 106 mmol/L   CO2 22 20 - 29 mmol/L   Calcium 10.0 8.6 - 10.2 mg/dL      Assessment & Plan:   Problem List Items Addressed This Visit    None    Visit Diagnoses    Acute on chronic  congestive heart failure, unspecified heart failure type ([place])    -  Primary   Doing better, but still not quite there. Continues with swelling. Kidney function fine on last check. Continue 80mg  lasix until follow up on Thursday.   Acute right-sided low back pain with right-sided sciatica       No better. Will get him x-rays. Ordered today. Await results. Treat as needed.    Relevant Orders   DG Lumbar Spine Complete   DG Hip Unilat W OR W/O Pelvis 2-3 Views Right       Follow up plan: Return as scheduled for next week.    . This visit was completed via FaceTime due to the restrictions of the COVID-19 pandemic. All issues as above were discussed and addressed. Physical exam was done as above through visual confirmation on FaceTime. If it was felt that the patient should be evaluated in the office, they were directed there. The patient verbally consented to this visit. . Location of the patient: home . Location of the provider: home . Those involved with this call:  . Provider: Park Liter, DO . CMA: Tiffany Reel, CMA . Front Desk/Registration: Don Perking  . Time spent on call: 25 minutes with patient face to face via video conference. More than 50% of this time was spent in counseling and coordination of care. 40 minutes total spent in review of patient's record and preparation of their chart.

## 2018-11-19 ENCOUNTER — Encounter: Payer: Self-pay | Admitting: Family Medicine

## 2018-11-19 DIAGNOSIS — Z952 Presence of prosthetic heart valve: Secondary | ICD-10-CM | POA: Diagnosis not present

## 2018-11-19 DIAGNOSIS — Z7901 Long term (current) use of anticoagulants: Secondary | ICD-10-CM | POA: Diagnosis not present

## 2018-11-19 LAB — POCT INR: INR: 2.7 (ref 2.0–3.0)

## 2018-11-21 ENCOUNTER — Encounter: Payer: Self-pay | Admitting: Family Medicine

## 2018-11-21 ENCOUNTER — Other Ambulatory Visit: Payer: Self-pay

## 2018-11-21 ENCOUNTER — Ambulatory Visit (INDEPENDENT_AMBULATORY_CARE_PROVIDER_SITE_OTHER): Payer: Medicare Other | Admitting: Family Medicine

## 2018-11-21 DIAGNOSIS — M4726 Other spondylosis with radiculopathy, lumbar region: Secondary | ICD-10-CM | POA: Diagnosis not present

## 2018-11-21 DIAGNOSIS — I509 Heart failure, unspecified: Secondary | ICD-10-CM

## 2018-11-21 NOTE — Assessment & Plan Note (Signed)
To see neurosurgery tomorrow. Await their input. Call with any concerns. Recheck 2 weeks.

## 2018-11-21 NOTE — Progress Notes (Signed)
There were no vitals taken for this visit.   Subjective:    Patient ID: Roberto [name], male    DOB: 1956-03-23, 63 y.o.   MRN: [id number]  HPI: Roberto [name] is a 63 y.o. male  Chief Complaint  Patient presents with  . Congestive Heart Failure  . Back Pain   Roberto [name] presents today via virtual visit for follow up on his CHF exacerbation. He notes that his swelling is better. Back to normal with the breathing. He feels like he is OK to go back to 1 a day lasix. He is not coughing. No wheezing. He notes that his back is no better though.  Notes that his back continues to really hurt with pain shooting down R leg. He is going to see the neurosurgeon tomorrow. Pain continues to be shooting and severe in nature.  Better with sitting, worse with movement. Continues with numbness. He is otherwise feeling well with no other concerns or complaints at this time.   Relevant past medical, surgical, family and social history reviewed and updated as indicated. Interim medical history since our last visit reviewed. Allergies and medications reviewed and updated.  Review of Systems  Constitutional: Negative.   Respiratory: Negative.   Cardiovascular: Negative.   Musculoskeletal: Positive for back pain, gait problem and myalgias. Negative for arthralgias, joint swelling, neck pain and neck stiffness.  Neurological: Positive for numbness. Negative for dizziness, tremors, seizures, syncope, facial asymmetry, speech difficulty, weakness, light-headedness and headaches.  Psychiatric/Behavioral: Negative.     Per HPI unless specifically indicated above     Objective:    There were no vitals taken for this visit.  Wt Readings from Last 3 Encounters:  11/12/18 (!) 341 lb 6.4 oz (154.9 kg)  11/08/18 (!) 338 lb 6.4 oz (153.5 kg)  11/04/18 (!) 342 lb (155.1 kg)    Physical Exam Vitals signs and nursing note reviewed.  Constitutional:      General: He is not in acute distress.    Appearance:  Normal appearance. He is not ill-appearing, toxic-appearing or diaphoretic.  HENT:     Head: Normocephalic and atraumatic.     Right Ear: External ear normal.     Left Ear: External ear normal.     Nose: Nose normal.     Mouth/Throat:     Mouth: Mucous membranes are moist.     Pharynx: Oropharynx is clear.  Eyes:     General: No scleral icterus.       Right eye: No discharge.        Left eye: No discharge.     Conjunctiva/sclera: Conjunctivae normal.     Pupils: Pupils are equal, round, and reactive to light.  Neck:     Musculoskeletal: Normal range of motion.  Pulmonary:     Effort: Pulmonary effort is normal. No respiratory distress.     Comments: Speaking in full sentences Musculoskeletal: Normal range of motion.  Skin:    Coloration: Skin is not jaundiced or pale.     Findings: No bruising, erythema, lesion or rash.  Neurological:     Mental Status: He is alert and oriented to person, place, and time. Mental status is at baseline.  Psychiatric:        Mood and Affect: Mood normal.        Behavior: Behavior normal.        Thought Content: Thought content normal.        Judgment: Judgment normal.     Results for orders placed  or performed in visit on 78/58/85  Basic metabolic panel  Result Value Ref Range   Glucose 103 (H) 65 - 99 mg/dL   BUN 32 (H) 8 - 27 mg/dL   Creatinine, Ser 1.01 0.76 - 1.27 mg/dL   GFR calc non Af Amer 78 >59 mL/min/1.73   GFR calc Af Amer 90 >59 mL/min/1.73   BUN/Creatinine Ratio 32 (H) 10 - 24   Sodium 136 134 - 144 mmol/L   Potassium 4.9 3.5 - 5.2 mmol/L   Chloride 98 96 - 106 mmol/L   CO2 22 20 - 29 mmol/L   Calcium 10.0 8.6 - 10.2 mg/dL      Assessment & Plan:   Problem List Items Addressed This Visit      Nervous and Auditory   Osteoarthritis of spine with radiculopathy, lumbar region - Primary    To see neurosurgery tomorrow. Await their input. Call with any concerns. Recheck 2 weeks.        Other Visit Diagnoses    Acute on  chronic congestive heart failure, unspecified heart failure type ([place])       Seems to be doing better. No more swelling. OK to go back to 40mg  lasix daily. Call with any concerns. Continue to monitor.        Follow up plan: Return 2 weeks, for follow up back and INR.   Marland Kitchen This visit was completed via FaceTime due to the restrictions of the COVID-19 pandemic. All issues as above were discussed and addressed. Physical exam was done as above through visual confirmation on FaceTime. If it was felt that the patient should be evaluated in the office, they were directed there. The patient verbally consented to this visit. . Location of the patient: home . Location of the provider: home . Those involved with this call:  . Provider: Park Liter, DO . CMA: Tiffany Reel, CMA . Front Desk/Registration: Don Perking  . Time spent on call: 15 minutes with patient face to face via video conference. More than 50% of this time was spent in counseling and coordination of care. 23 minutes total spent in review of patient's record and preparation of their chart.

## 2018-11-22 ENCOUNTER — Ambulatory Visit: Payer: Self-pay | Admitting: Pharmacist

## 2018-11-22 NOTE — Chronic Care Management (AMB) (Signed)
  Chronic Care Management   Note  11/22/2018 Name: Roberto [name] MRN: [id number] DOB: 04/05/1956  Roberto [name] is a 63 y.o. year old male who is a primary care patient of Valerie Roys, DO.   During his visit yesterday, patient asked about the safety of combining "green tea fat busting" supplement with his current medications.   Reviewed Natural Medicines Database. It appears most supplements like this contain green tea and ginger, both of which can increase risk of bleeding in combination with anticoagulant/antiplatelet agents. In addition, they contain caffeine, which in high doses could exacerbate underlying cardiac comorbidities.   Contacted patient, spoke with Vangie Bicker (on DPR). Explained the above. She verbalized understanding and noted that she would relay this information to the patient.   Catie Darnelle Maffucci, PharmD Clinical Pharmacist [place] [phone]

## 2018-11-26 ENCOUNTER — Encounter: Payer: Self-pay | Admitting: Family Medicine

## 2018-11-26 LAB — POCT INR: INR: 2.7 (ref 2.0–3.0)

## 2018-12-04 ENCOUNTER — Encounter: Payer: Self-pay | Admitting: Family Medicine

## 2018-12-04 LAB — PROTIME-INR: INR: 3.6 — AB (ref 0.9–1.1)

## 2018-12-10 ENCOUNTER — Other Ambulatory Visit: Payer: Self-pay

## 2018-12-10 ENCOUNTER — Telehealth: Payer: Self-pay

## 2018-12-10 ENCOUNTER — Encounter: Payer: Self-pay | Admitting: Family Medicine

## 2018-12-10 LAB — PROTIME-INR: INR: 2.8 — AB (ref 0.9–1.1)

## 2018-12-10 MED ORDER — SPIRONOLACTONE 25 MG PO TABS: 12.5000 mg | ORAL_TABLET | Freq: Every day | ORAL | 2 refills | Status: DC

## 2018-12-10 MED ORDER — FUROSEMIDE 40 MG PO TABS: 40.0000 mg | ORAL_TABLET | Freq: Every day | ORAL | 0 refills | Status: DC

## 2018-12-10 MED ORDER — SIMVASTATIN 40 MG PO TABS: 40.0000 mg | ORAL_TABLET | Freq: Every day | ORAL | 1 refills | Status: DC

## 2018-12-10 MED ORDER — CYCLOBENZAPRINE HCL 10 MG PO TABS: 10.0000 mg | ORAL_TABLET | Freq: Two times a day (BID) | ORAL | 0 refills | Status: DC | PRN

## 2018-12-10 NOTE — Telephone Encounter (Signed)
Pharmacy is requesting a refill on Spironolactone 25 mg 45 tab take 1/2 tab daily.filled 10/07/2018. Please review for refill.

## 2018-12-10 NOTE — Telephone Encounter (Signed)
[place] for refill.  RX sent to the pharmacy.

## 2018-12-19 ENCOUNTER — Encounter: Payer: Self-pay | Admitting: Family Medicine

## 2018-12-19 DIAGNOSIS — Z7901 Long term (current) use of anticoagulants: Secondary | ICD-10-CM | POA: Diagnosis not present

## 2018-12-19 DIAGNOSIS — Z952 Presence of prosthetic heart valve: Secondary | ICD-10-CM | POA: Diagnosis not present

## 2018-12-19 LAB — PROTIME-INR: INR: 2.5 — AB (ref 0.9–1.1)

## 2018-12-24 ENCOUNTER — Encounter: Payer: Self-pay | Admitting: Family Medicine

## 2018-12-24 LAB — PROTIME-INR: INR: 2.6 — AB (ref 0.9–1.1)

## 2018-12-27 ENCOUNTER — Telehealth: Payer: Self-pay | Admitting: Internal Medicine

## 2018-12-27 NOTE — Telephone Encounter (Signed)
Spoke with wife and let her know that I will get the forms to Dr End as soon as I can today. Gave her a number recommended to call by Laurine Blazer to see if they will extend his license r/t the [place] situation  ([phone], opt for Medical Department).  Encouraged her to go ahead and call them to see about getting an extension. She will.  Meanwhile, I've got the forms and will give to Dr End when he is back in the office later today.

## 2018-12-27 NOTE — Telephone Encounter (Signed)
Patient has DOT forms that need to be filled out. Patient is stuck in [place] because license expired, needs ASAP. Please fax and call when can update.

## 2018-12-27 NOTE — Telephone Encounter (Signed)
Another encounter from today already open pertaining to this situation. Closing this encounter.

## 2018-12-27 NOTE — Telephone Encounter (Signed)
I spoke with pt's spouse and she mentioned that her husband doesn't need any of his medications filled at this time. She is requesting that Dr.End fill out pt's DOT forms that she dropped off today so that her husband can get back on the road coming from [place]. Pt's license has expired and [place] able to drive back from [place] until forms all filled out and faxed back to [employer]. Pt's wife mentioned that if we aren't able to fill application out pt will run out of medication and she will have to go to [place] to pick pt up. She would like the provider to know the urgency in this matter and If it will take some time to get forms filled out that she needs to know before it gets to late bc she has to work and would have to make arrangements so that pt doesn't run out of medication.   Please advise pt.

## 2018-12-27 NOTE — H&P (View-Only) (Signed)
Cardiology Office Note Date:  12/31/2018  Patient ID:  Roberto [name], DOB July 22, 1955, MRN [id number] PCP:  Valerie Roys, DO  Cardiologist:  Dr. Saunders Revel, MD    Chief Complaint: Follow up  History of Present Illness: Roberto [name] is a 63 y.o. male with history of bicuspid aortic valve status post mechanical AVR 03/25/2010 on Coumadin, chronic systolic CHF with EF 65-03% secondary to NICM, HLD, stroke, renal infarct who presents for follow up of his cardiomyopathy and AVR.   [facility] 03/2010 showed no significant CAD, LVEDP 31 mmHg, mean AoV gradient 34 mmHg at rest and 47 mmHg with dobutamine 20 mcg/kg/min, AVA 1.0 cm^2, RA 31, RV 68/25, PA 68/47, PCWP 38. PA sat 65%. CO 6.2 L/min (Fick) and 5.3 L/min (thermodilution). TTE 06/2016 showed an EF 40-45%, moderately dilated LV with moderate LVH, AVR well-seated with 14 mmHg gradient, peak AV velocity 2.5 m/s, mild mitral valve thickening with mild MR, mildly dilated RV with mildly reduced contraction.   He was most recently evaluated in telemedicine by Dr. Saunders Revel in 10/2018 and was noting a recent URI (tested negative for COVID-19) requiring steroid taper. He noted his weight was down ~ 15 pounds at that time with a patient reported weight of 315 pounds. He was also now checking his INRs at home.   He was seen in the ED on 11/04/2018 with 1 week history of increased SOB, weight gain and leg swelling. Documented weight in the ED of 342 pounds. Troponin negative x 1, BNP 77, CBC unremarkable, BUN 30, SCr 1.18, CXR with stable cardiomegaly and interstitial pulmonary edema, EKG with sinus rhythm with PVCs. His Lasix was doubled to 80 mg daily with recommended outpatient follow up. Since his ED visit, he has been following up with his PCP, who was assisting with diuresis. He was last seen by her on 5/21, and noted improvement in volume status. In this setting, his Lasix was decreased back down to 40 mg daily.   Labs: 12/10/2018 - INR 2.8 11/12/2018 - BUN 32, SCr  1.01, K+ 4.9  He comes in doing well from a cardiac perspective.  He denies any chest pain, shortness of breath, palpitations, dizziness, presyncope, or syncope.  His weight is up 21 pounds when compared to his last in person office visit from 06/2018 though he attributes this to sedentary lifestyle and increased food consumption.  He does not feel like he is volume overloaded.  He indicates his lower back has been bothering him which has limited his ability to walk on the treadmill.  He states he has a fair amount of arthritis in his lower back/hips.  He denies any increase in lower extremity swelling, abdominal distention, orthopnea, PND, or early satiety.  Blood pressure remains well controlled.  No falls, BRBPR, or melena.  Tolerating all medications without issues.   Past Medical History:  Diagnosis Date  . Bicuspid aortic valve    a. s/p #27 [employer]omedics mechanical valve on 03/25/2010; b. on Coumadin; c. TTE 12/17: EF 40-45%, moderately dilated LV with moderate LVH, AVR well-seated with 14 mmHg gradient, peak AV velocity 2.5 m/s, mild mitral valve thickening with mild MR, mildly dilated RV with mildly reduced contraction  . Cellulitis   . Chronic systolic CHF (congestive heart failure) ([place])    a. R/[facility] 03/2010 showed no significant CAD, LVEDP 31 mmHg, mean AoV gradient 34 mmHg at rest and 47 mmHg with dobutamine 20 mcg/kg/min, AVA 1.0 cm^2, RA 31, RV 68/25, PA 68/47, PCWP 38. PA sat  65%. CO 6.2 L/min (Fick) and 5.3 L/min (thermodilution)  . Clotting disorder ([place])   . H/O mechanical aortic valve replacement 03/25/2010   a. #27 Carbomedics mechanical valve  . Hypercholesterolemia   . Renal infarct [facility]) 2017   Multiple right renal infarcts, likely embolic.  . Stroke ([place])   . TIA (transient ischemic attack) 05/2014    Past Surgical History:  Procedure Laterality Date  . AORTIC VALVE REPLACEMENT    . CARDIAC CATHETERIZATION  03/21/2010   No significant CAD. Severe aortic stenosis.  Severely elevated left and right heart filling pressures.  Marland Kitchen CARDIAC SURGERY  2009   CHF  . CARPAL TUNNEL RELEASE Left 2005  . TONSILLECTOMY  1962    Current Meds  Medication Sig  . albuterol (PROVENTIL HFA;VENTOLIN HFA) 108 (90 Base) MCG/ACT inhaler Inhale 2 puffs into the lungs every 6 (six) hours as needed for wheezing or shortness of breath.  Marland Kitchen albuterol (PROVENTIL) (2.5 MG/3ML) 0.083% nebulizer solution Take 3 mLs (2.5 mg total) by nebulization every 6 (six) hours as needed for wheezing or shortness of breath.  Marland Kitchen aspirin 81 MG chewable tablet Chew 81 mg by mouth daily.    . cyclobenzaprine (FLEXERIL) 10 MG tablet Take 1 tablet (10 mg total) by mouth 3 times/day as needed-between meals & bedtime for muscle spasms.  . Fluticasone-Salmeterol (ADVAIR) 250-50 MCG/DOSE AEPB Inhale 1 puff into the lungs 2 (two) times daily as needed.  . furosemide (LASIX) 40 MG tablet Take 1 tablet (40 mg total) by mouth daily.  Marland Kitchen lisinopril (PRINIVIL,ZESTRIL) 5 MG tablet Take 1 tablet (5 mg total) by mouth daily.  . metoprolol succinate (TOPROL-XL) 100 MG 24 hr tablet TAKE 1 TABLET BY MOUTH ONCE DAILY WITH  OR  IMMEDIATELY  FOLLOWING  A  MEAL  . simvastatin (ZOCOR) 40 MG tablet Take 1 tablet (40 mg total) by mouth at bedtime.  Marland Kitchen tiotropium (SPIRIVA) 18 MCG inhalation capsule Place 18 mcg into inhaler and inhale daily as needed.  . warfarin (COUMADIN) 4 MG tablet Take 1 tablet (4 mg total) by mouth daily at 6 PM. Take as directed  . warfarin (COUMADIN) 7.5 MG tablet Take as directed    Allergies:   Patient has no known allergies.   Social History:  The patient  reports that he has been smoking cigarettes. He started smoking about 17 months ago. He has a 23.00 pack-year smoking history. He has never used smokeless tobacco. He reports that he does not drink alcohol or use drugs.   Family History:  The patient's family history includes Arthritis in his father and mother; Breast cancer in his sister; Cancer in  his brother; Colon cancer in his father and mother; Dementia in his mother; Diabetes in his father; Heart attack in his brother and brother; Heart disease in his brother; Seizures in his sister; Stroke in his father.  ROS:   Review of Systems  Constitutional: Negative for chills, diaphoresis, fever, malaise/fatigue and weight loss.  HENT: Negative for congestion.   Eyes: Negative for discharge and redness.  Respiratory: Negative for cough, hemoptysis, sputum production, shortness of breath and wheezing.   Cardiovascular: Negative for chest pain, palpitations, orthopnea, claudication, leg swelling and PND.  Gastrointestinal: Negative for abdominal pain, blood in stool, heartburn, melena, nausea and vomiting.  Genitourinary: Negative for hematuria.  Musculoskeletal: Positive for back pain. Negative for falls and myalgias.  Skin: Negative for rash.  Neurological: Negative for dizziness, tingling, tremors, sensory change, speech change, focal weakness, loss of consciousness  and weakness.  Endo/Heme/Allergies: Does not bruise/bleed easily.  Psychiatric/Behavioral: Negative for substance abuse. The patient is not nervous/anxious.   All other systems reviewed and are negative.    PHYSICAL EXAM:  VS:  BP 110/64 (BP Location: Left Arm, Patient Position: Sitting, Cuff Size: Large)   Pulse 85   Ht 5\' 6"  (1.676 m)   Wt (!) 352 lb (159.7 kg)   BMI 56.81 kg/m  BMI: Body mass index is 56.81 kg/m.  Physical Exam  Constitutional: He is oriented to person, place, and time. He appears well-developed and well-nourished.  HENT:  Head: Normocephalic and atraumatic.  Eyes: Right eye exhibits no discharge. Left eye exhibits no discharge.  Neck: Normal range of motion. No JVD present.  Cardiovascular: Normal rate, regular rhythm, S1 normal, S2 normal and normal heart sounds. Exam reveals no distant heart sounds, no friction rub, no midsystolic click and no opening snap.  No murmur heard. Pulses:       Posterior tibial pulses are 2+ on the right side and 2+ on the left side.  II/VI systolic murmur best heard in the right upper sternal border  Pulmonary/Chest: Effort normal and breath sounds normal. No respiratory distress. He has no decreased breath sounds. He has no wheezes. He has no rales. He exhibits no tenderness.  Abdominal: Soft. He exhibits no distension. There is no abdominal tenderness.  Musculoskeletal:        General: No edema.  Neurological: He is alert and oriented to person, place, and time.  Skin: Skin is warm and dry. No cyanosis. Nails show no clubbing.  Psychiatric: He has a normal mood and affect. His speech is normal and behavior is normal. Judgment and thought content normal.     EKG:  Was ordered and interpreted by me today. Shows NSR, 85 bpm, occasional PVCs, poor R wave progression along the precordial leads, nonspecific st/t changes  Recent Labs: 07/25/2018: ALT 14; TSH 1.520 11/04/2018: B Natriuretic Peptide 77.0; Hemoglobin 14.9; Platelets 270 11/12/2018: BUN 32; Creatinine, Ser 1.01; Potassium 4.9; Sodium 136  03/15/2018: Total CHOL/HDL Ratio 4.9; VLDL 63 07/25/2018: Cholesterol, Total 202; HDL 41; LDL Calculated 107; Triglycerides 271   CrCl cannot be calculated (Patient's most recent lab result is older than the maximum 21 days allowed.).   Wt Readings from Last 3 Encounters:  12/31/18 (!) 352 lb (159.7 kg)  11/12/18 (!) 341 lb 6.4 oz (154.9 kg)  11/08/18 (!) 338 lb 6.4 oz (153.5 kg)     Other studies reviewed: Additional studies/records reviewed today include: summarized above  ASSESSMENT AND PLAN:  1. HFrEF secondary to NICM: He appears euvolemic and well compensated. His weight is up 37 pounds when compared to his telehealth visit in 10/2018 (his scale compared to ours) and 21 pounds when compared to his last office visit with [place] in person in 06/2018 (331-->352).  He attributes this weight gain secondary to sedentary lifestyle secondary to increased low  back pain which is limiting his ability to ambulate on a treadmill.  It appears spironolactone has fallen off his medication list, though I do not see when or why this occurred.  However, the patient does indicate he is taking spironolactone.  Continue Toprol XL, lisinopril, and Lasix.  Check CMP given recent outpatient diuresis.  2. Aortic stenosis s/p mechanical AVR: He denies any symptoms concerning for valve decompensation.  Update echo.  Most recent INR of 2.8 from 12/10/2018, managed by PCP with home INR checks. Continue Coumadin and ASA. SBE prophylaxis for dental  procedures.    3. HTN: Blood pressure is well controlled today.  Continue current medications  4. HLD: Remains on simvastatin. Check lipid panel and liver function.   5. Morbid obesity: Weight loss advised.   6. Tobacco abuse: Complete cessation advised.   Disposition: F/u with Dr. Saunders Revel or an APP in 6 months.  Current medicines are reviewed at length with the patient today.  The patient did not have any concerns regarding medicines.  Signed, Christell Faith, [name] 12/31/2018 8:03 AM     [place], [place] [address] [phone]

## 2018-12-27 NOTE — Progress Notes (Signed)
Cardiology Office Note Date:  12/31/2018  Patient ID:  Roberto [name], DOB Nov 13, 1955, MRN [id number] PCP:  Valerie Roys, DO  Cardiologist:  Dr. Saunders Revel, MD    Chief Complaint: Follow up  History of Present Illness: Roberto [name] is a 63 y.o. male with history of bicuspid aortic valve status post mechanical AVR 03/25/2010 on Coumadin, chronic systolic CHF with EF 43-32% secondary to NICM, HLD, stroke, renal infarct who presents for follow up of his cardiomyopathy and AVR.   [facility] 03/2010 showed no significant CAD, LVEDP 31 mmHg, mean AoV gradient 34 mmHg at rest and 47 mmHg with dobutamine 20 mcg/kg/min, AVA 1.0 cm^2, RA 31, RV 68/25, PA 68/47, PCWP 38. PA sat 65%. CO 6.2 L/min (Fick) and 5.3 L/min (thermodilution). TTE 06/2016 showed an EF 40-45%, moderately dilated LV with moderate LVH, AVR well-seated with 14 mmHg gradient, peak AV velocity 2.5 m/s, mild mitral valve thickening with mild MR, mildly dilated RV with mildly reduced contraction.   He was most recently evaluated in telemedicine by Dr. Saunders Revel in 10/2018 and was noting a recent URI (tested negative for COVID-19) requiring steroid taper. He noted his weight was down ~ 15 pounds at that time with a patient reported weight of 315 pounds. He was also now checking his INRs at home.   He was seen in the ED on 11/04/2018 with 1 week history of increased SOB, weight gain and leg swelling. Documented weight in the ED of 342 pounds. Troponin negative x 1, BNP 77, CBC unremarkable, BUN 30, SCr 1.18, CXR with stable cardiomegaly and interstitial pulmonary edema, EKG with sinus rhythm with PVCs. His Lasix was doubled to 80 mg daily with recommended outpatient follow up. Since his ED visit, he has been following up with his PCP, who was assisting with diuresis. He was last seen by her on 5/21, and noted improvement in volume status. In this setting, his Lasix was decreased back down to 40 mg daily.   Labs: 12/10/2018 - INR 2.8 11/12/2018 - BUN 32, SCr  1.01, K+ 4.9  He comes in doing well from a cardiac perspective.  He denies any chest pain, shortness of breath, palpitations, dizziness, presyncope, or syncope.  His weight is up 21 pounds when compared to his last in person office visit from 06/2018 though he attributes this to sedentary lifestyle and increased food consumption.  He does not feel like he is volume overloaded.  He indicates his lower back has been bothering him which has limited his ability to walk on the treadmill.  He states he has a fair amount of arthritis in his lower back/hips.  He denies any increase in lower extremity swelling, abdominal distention, orthopnea, PND, or early satiety.  Blood pressure remains well controlled.  No falls, BRBPR, or melena.  Tolerating all medications without issues.   Past Medical History:  Diagnosis Date  . Bicuspid aortic valve    a. s/p #27 Carbomedics mechanical valve on 03/25/2010; b. on Coumadin; c. TTE 12/17: EF 40-45%, moderately dilated LV with moderate LVH, AVR well-seated with 14 mmHg gradient, peak AV velocity 2.5 m/s, mild mitral valve thickening with mild MR, mildly dilated RV with mildly reduced contraction  . Cellulitis   . Chronic systolic CHF (congestive heart failure) ([place])    a. R/[facility] 03/2010 showed no significant CAD, LVEDP 31 mmHg, mean AoV gradient 34 mmHg at rest and 47 mmHg with dobutamine 20 mcg/kg/min, AVA 1.0 cm^2, RA 31, RV 68/25, PA 68/47, PCWP 38. PA sat  65%. CO 6.2 L/min (Fick) and 5.3 L/min (thermodilution)  . Clotting disorder ([place])   . H/O mechanical aortic valve replacement 03/25/2010   a. #27 Carbomedics mechanical valve  . Hypercholesterolemia   . Renal infarct [facility]) 2017   Multiple right renal infarcts, likely embolic.  . Stroke ([place])   . TIA (transient ischemic attack) 05/2014    Past Surgical History:  Procedure Laterality Date  . AORTIC VALVE REPLACEMENT    . CARDIAC CATHETERIZATION  03/21/2010   No significant CAD. Severe aortic stenosis.  Severely elevated left and right heart filling pressures.  Marland Kitchen CARDIAC SURGERY  2009   CHF  . CARPAL TUNNEL RELEASE Left 2005  . TONSILLECTOMY  1962    Current Meds  Medication Sig  . albuterol (PROVENTIL HFA;VENTOLIN HFA) 108 (90 Base) MCG/ACT inhaler Inhale 2 puffs into the lungs every 6 (six) hours as needed for wheezing or shortness of breath.  Marland Kitchen albuterol (PROVENTIL) (2.5 MG/3ML) 0.083% nebulizer solution Take 3 mLs (2.5 mg total) by nebulization every 6 (six) hours as needed for wheezing or shortness of breath.  Marland Kitchen aspirin 81 MG chewable tablet Chew 81 mg by mouth daily.    . cyclobenzaprine (FLEXERIL) 10 MG tablet Take 1 tablet (10 mg total) by mouth 3 times/day as needed-between meals & bedtime for muscle spasms.  . Fluticasone-Salmeterol (ADVAIR) 250-50 MCG/DOSE AEPB Inhale 1 puff into the lungs 2 (two) times daily as needed.  . furosemide (LASIX) 40 MG tablet Take 1 tablet (40 mg total) by mouth daily.  Marland Kitchen lisinopril (PRINIVIL,ZESTRIL) 5 MG tablet Take 1 tablet (5 mg total) by mouth daily.  . metoprolol succinate (TOPROL-XL) 100 MG 24 hr tablet TAKE 1 TABLET BY MOUTH ONCE DAILY WITH  OR  IMMEDIATELY  FOLLOWING  A  MEAL  . simvastatin (ZOCOR) 40 MG tablet Take 1 tablet (40 mg total) by mouth at bedtime.  Marland Kitchen tiotropium (SPIRIVA) 18 MCG inhalation capsule Place 18 mcg into inhaler and inhale daily as needed.  . warfarin (COUMADIN) 4 MG tablet Take 1 tablet (4 mg total) by mouth daily at 6 PM. Take as directed  . warfarin (COUMADIN) 7.5 MG tablet Take as directed    Allergies:   Patient has no known allergies.   Social History:  The patient  reports that he has been smoking cigarettes. He started smoking about 17 months ago. He has a 23.00 pack-year smoking history. He has never used smokeless tobacco. He reports that he does not drink alcohol or use drugs.   Family History:  The patient's family history includes Arthritis in his father and mother; Breast cancer in his sister; Cancer in  his brother; Colon cancer in his father and mother; Dementia in his mother; Diabetes in his father; Heart attack in his brother and brother; Heart disease in his brother; Seizures in his sister; Stroke in his father.  ROS:   Review of Systems  Constitutional: Negative for chills, diaphoresis, fever, malaise/fatigue and weight loss.  HENT: Negative for congestion.   Eyes: Negative for discharge and redness.  Respiratory: Negative for cough, hemoptysis, sputum production, shortness of breath and wheezing.   Cardiovascular: Negative for chest pain, palpitations, orthopnea, claudication, leg swelling and PND.  Gastrointestinal: Negative for abdominal pain, blood in stool, heartburn, melena, nausea and vomiting.  Genitourinary: Negative for hematuria.  Musculoskeletal: Positive for back pain. Negative for falls and myalgias.  Skin: Negative for rash.  Neurological: Negative for dizziness, tingling, tremors, sensory change, speech change, focal weakness, loss of consciousness  and weakness.  Endo/Heme/Allergies: Does not bruise/bleed easily.  Psychiatric/Behavioral: Negative for substance abuse. The patient is not nervous/anxious.   All other systems reviewed and are negative.    PHYSICAL EXAM:  VS:  BP 110/64 (BP Location: Left Arm, Patient Position: Sitting, Cuff Size: Large)   Pulse 85   Ht 5\' 6"  (1.676 m)   Wt (!) 352 lb (159.7 kg)   BMI 56.81 kg/m  BMI: Body mass index is 56.81 kg/m.  Physical Exam  Constitutional: He is oriented to person, place, and time. He appears well-developed and well-nourished.  HENT:  Head: Normocephalic and atraumatic.  Eyes: Right eye exhibits no discharge. Left eye exhibits no discharge.  Neck: Normal range of motion. No JVD present.  Cardiovascular: Normal rate, regular rhythm, S1 normal, S2 normal and normal heart sounds. Exam reveals no distant heart sounds, no friction rub, no midsystolic click and no opening snap.  No murmur heard. Pulses:       Posterior tibial pulses are 2+ on the right side and 2+ on the left side.  II/VI systolic murmur best heard in the right upper sternal border  Pulmonary/Chest: Effort normal and breath sounds normal. No respiratory distress. He has no decreased breath sounds. He has no wheezes. He has no rales. He exhibits no tenderness.  Abdominal: Soft. He exhibits no distension. There is no abdominal tenderness.  Musculoskeletal:        General: No edema.  Neurological: He is alert and oriented to person, place, and time.  Skin: Skin is warm and dry. No cyanosis. Nails show no clubbing.  Psychiatric: He has a normal mood and affect. His speech is normal and behavior is normal. Judgment and thought content normal.     EKG:  Was ordered and interpreted by me today. Shows NSR, 85 bpm, occasional PVCs, poor R wave progression along the precordial leads, nonspecific st/t changes  Recent Labs: 07/25/2018: ALT 14; TSH 1.520 11/04/2018: B Natriuretic Peptide 77.0; Hemoglobin 14.9; Platelets 270 11/12/2018: BUN 32; Creatinine, Ser 1.01; Potassium 4.9; Sodium 136  03/15/2018: Total CHOL/HDL Ratio 4.9; VLDL 63 07/25/2018: Cholesterol, Total 202; HDL 41; LDL Calculated 107; Triglycerides 271   CrCl cannot be calculated (Patient's most recent lab result is older than the maximum 21 days allowed.).   Wt Readings from Last 3 Encounters:  12/31/18 (!) 352 lb (159.7 kg)  11/12/18 (!) 341 lb 6.4 oz (154.9 kg)  11/08/18 (!) 338 lb 6.4 oz (153.5 kg)     Other studies reviewed: Additional studies/records reviewed today include: summarized above  ASSESSMENT AND PLAN:  1. HFrEF secondary to NICM: He appears euvolemic and well compensated. His weight is up 37 pounds when compared to his telehealth visit in 10/2018 (his scale compared to ours) and 21 pounds when compared to his last office visit with [place] in person in 06/2018 (331-->352).  He attributes this weight gain secondary to sedentary lifestyle secondary to increased low  back pain which is limiting his ability to ambulate on a treadmill.  It appears spironolactone has fallen off his medication list, though I do not see when or why this occurred.  However, the patient does indicate he is taking spironolactone.  Continue Toprol XL, lisinopril, and Lasix.  Check CMP given recent outpatient diuresis.  2. Aortic stenosis s/p mechanical AVR: He denies any symptoms concerning for valve decompensation.  Update echo.  Most recent INR of 2.8 from 12/10/2018, managed by PCP with home INR checks. Continue Coumadin and ASA. SBE prophylaxis for dental  procedures.    3. HTN: Blood pressure is well controlled today.  Continue current medications  4. HLD: Remains on simvastatin. Check lipid panel and liver function.   5. Morbid obesity: Weight loss advised.   6. Tobacco abuse: Complete cessation advised.   Disposition: F/u with Dr. Saunders Revel or an APP in 6 months.  Current medicines are reviewed at length with the patient today.  The patient did not have any concerns regarding medicines.  Signed, Christell Faith, [name] 12/31/2018 8:03 AM     [address] [place], [place] [address] [phone]

## 2018-12-27 NOTE — Telephone Encounter (Signed)
Spoke to patient's wife and let her know Dr End is unable to complete this form. Patient has not had the necessary testing as listed on this form in the time frame they require. Advised I could set him up with next available to discuss these things. Scheduled to see Thurmond Butts next week.  Attempted to reach patient to prescreen him. No answer. Left message to call back.    Patient just needs to be pre-screened for appointment.

## 2018-12-27 NOTE — Telephone Encounter (Signed)
°*  STAT* If patient is at the pharmacy, call can be transferred to refill team.   1. Which medications need to be refilled? (please list name of each medication and dose if known) ALL  2. Which pharmacy/location (including street and city if local pharmacy) is medication to be sent to? Unknown, patient stuck in [place]  3. Do they need a 30 day or 90 day supply? Unknown

## 2018-12-30 NOTE — Telephone Encounter (Signed)
Unable to speak with patient as he is having phone issues per SO kim   Unable to screen

## 2018-12-31 ENCOUNTER — Other Ambulatory Visit: Payer: Self-pay

## 2018-12-31 ENCOUNTER — Encounter: Payer: Self-pay | Admitting: Physician Assistant

## 2018-12-31 ENCOUNTER — Ambulatory Visit (INDEPENDENT_AMBULATORY_CARE_PROVIDER_SITE_OTHER): Payer: Medicare Other | Admitting: Physician Assistant

## 2018-12-31 ENCOUNTER — Encounter: Payer: Self-pay | Admitting: Family Medicine

## 2018-12-31 VITALS — BP 110/64 | HR 85 | Ht 66.0 in | Wt 352.0 lb

## 2018-12-31 DIAGNOSIS — I2510 Atherosclerotic heart disease of native coronary artery without angina pectoris: Secondary | ICD-10-CM

## 2018-12-31 DIAGNOSIS — I10 Essential (primary) hypertension: Secondary | ICD-10-CM | POA: Diagnosis not present

## 2018-12-31 DIAGNOSIS — I5022 Chronic systolic (congestive) heart failure: Secondary | ICD-10-CM

## 2018-12-31 DIAGNOSIS — Z952 Presence of prosthetic heart valve: Secondary | ICD-10-CM

## 2018-12-31 DIAGNOSIS — Z720 Tobacco use: Secondary | ICD-10-CM

## 2018-12-31 DIAGNOSIS — I38 Endocarditis, valve unspecified: Secondary | ICD-10-CM | POA: Diagnosis not present

## 2018-12-31 DIAGNOSIS — E785 Hyperlipidemia, unspecified: Secondary | ICD-10-CM

## 2018-12-31 DIAGNOSIS — I428 Other cardiomyopathies: Secondary | ICD-10-CM | POA: Diagnosis not present

## 2018-12-31 LAB — PROTIME-INR: INR: 2.7 — AB (ref 0.9–1.1)

## 2018-12-31 MED ORDER — SPIRONOLACTONE 25 MG PO TABS: 12.5000 mg | ORAL_TABLET | Freq: Every day | ORAL | 3 refills | Status: DC

## 2018-12-31 NOTE — Patient Instructions (Signed)
Medication Instructions:  Your physician recommends that you continue on your current medications as directed. Please refer to the Current Medication list given to you today. 1- Spironolactone- 0.5 tablet (12.5 mg total) once daily. Added back to your med list.  If you need a refill on your cardiac medications before your next appointment, please call your pharmacy.   Lab work: Your physician recommends that you have lab work today(direct LDL, lipid, CMET).   If you have labs (blood work) drawn today and your tests are completely normal, you will receive your results only by: Marland Kitchen MyChart Message (if you have MyChart) OR . A paper copy in the mail If you have any lab test that is abnormal or we need to change your treatment, we will call you to review the results.  Testing/Procedures: 1-Echo  Please return to [facility] on ______________ at _______________ AM/PM for an Echocardiogram. Your physician has requested that you have an echocardiogram. Echocardiography is a painless test that uses sound waves to create images of your heart. It provides your doctor with information about the size and shape of your heart and how well your heart's chambers and valves are working. This procedure takes approximately one hour. There are no restrictions for this procedure. Please note; depending on visual quality an IV may need to be placed.    Follow-Up: At [facility], you and your health needs are our priority.  As part of our continuing mission to provide you with exceptional heart care, we have created designated Provider Care Teams.  These Care Teams include your primary Cardiologist (physician) and Advanced Practice Providers (APPs -  Physician Assistants and Nurse Practitioners) who all work together to provide you with the care you need, when you need it. You will need a follow up appointment in 6 months.  Please call our office 2 months in advance to schedule this appointment.  You  may see Nelva Bush, MD or Murray Hodgkins, NP.

## 2019-01-01 ENCOUNTER — Telehealth: Payer: Self-pay

## 2019-01-01 LAB — COMPREHENSIVE METABOLIC PANEL
ALT: 17 IU/L (ref 0–44)
AST: 19 IU/L (ref 0–40)
Albumin/Globulin Ratio: 1.4 (ref 1.2–2.2)
Albumin: 4.3 g/dL (ref 3.8–4.8)
Alkaline Phosphatase: 44 IU/L (ref 39–117)
BUN/Creatinine Ratio: 19 (ref 10–24)
BUN: 21 mg/dL (ref 8–27)
Bilirubin Total: <0.2 mg/dL (ref 0.0–1.2)
CO2: 20 mmol/L (ref 20–29)
Calcium: 9.2 mg/dL (ref 8.6–10.2)
Chloride: 106 mmol/L (ref 96–106)
Creatinine, Ser: 1.09 mg/dL (ref 0.76–1.27)
GFR calc Af Amer: 82 mL/min/{1.73_m2} (ref >59)
GFR calc non Af Amer: 71 mL/min/{1.73_m2} (ref >59)
Globulin, Total: 3.1 g/dL (ref 1.5–4.5)
Glucose: 119 mg/dL — ABNORMAL HIGH (ref 65–99)
Potassium: 4.7 mmol/L (ref 3.5–5.2)
Sodium: 141 mmol/L (ref 134–144)
Total Protein: 7.4 g/dL (ref 6.0–8.5)

## 2019-01-01 LAB — LIPID PANEL
Chol/HDL Ratio: 5.8 ratio — ABNORMAL HIGH (ref 0.0–5.0)
Cholesterol, Total: 226 mg/dL — ABNORMAL HIGH (ref 100–199)
HDL: 39 mg/dL — ABNORMAL LOW (ref >39)
Triglycerides: 478 mg/dL — ABNORMAL HIGH (ref 0–149)

## 2019-01-01 LAB — LDL CHOLESTEROL, DIRECT: LDL Direct: 94 mg/dL (ref 0–99)

## 2019-01-01 NOTE — Telephone Encounter (Signed)
Spoke with the pt and made him aware of lab results and Christell Faith, PA recommendation. Pt sts that he would rather try lifestyle changes at this time in lieu of switching medications. Pt verbalized understanding to the information given and voiced appreciation for the call.

## 2019-01-01 NOTE — Telephone Encounter (Signed)
-----   Message from Rise Mu, [name] sent at 01/01/2019  1:20 PM EDT ----- Nonfasting lipid panel showed elevated triglycerides with a direct LDL of 94.  Random glucose is mildly elevated.  Kidney function normal.  Potassium at goal.  Liver function normal.  Recommend he decrease sugary foods and drinks as well as increase his physical activity.  If he would like, we could transition him from simvastatin to atorvastatin 40 mg daily with recommended recheck fasting lipid and liver function in approximately 2 months.

## 2019-01-02 ENCOUNTER — Ambulatory Visit (INDEPENDENT_AMBULATORY_CARE_PROVIDER_SITE_OTHER): Payer: Medicare Other

## 2019-01-02 ENCOUNTER — Other Ambulatory Visit: Payer: Self-pay

## 2019-01-02 ENCOUNTER — Telehealth: Payer: Self-pay | Admitting: Internal Medicine

## 2019-01-02 DIAGNOSIS — Z952 Presence of prosthetic heart valve: Secondary | ICD-10-CM | POA: Diagnosis not present

## 2019-01-02 DIAGNOSIS — I712 Thoracic aortic aneurysm, without rupture: Secondary | ICD-10-CM

## 2019-01-02 DIAGNOSIS — I5022 Chronic systolic (congestive) heart failure: Secondary | ICD-10-CM

## 2019-01-02 DIAGNOSIS — I7121 Aneurysm of the ascending aorta, without rupture: Secondary | ICD-10-CM

## 2019-01-02 DIAGNOSIS — R0602 Shortness of breath: Secondary | ICD-10-CM

## 2019-01-02 MED ORDER — PERFLUTREN LIPID MICROSPHERE
1.0000 mL | INTRAVENOUS | Status: AC | PRN
  Administered 2019-01-02: 2 mL via INTRAVENOUS

## 2019-01-02 NOTE — Telephone Encounter (Signed)
Patient spouse calling States they need an ECHO and a stress test ASAP to complete paperwork Patient scheduled today at 4 for ECHO but would need an order placed for stress test Please advise

## 2019-01-02 NOTE — Telephone Encounter (Signed)
Left message to call back  

## 2019-01-02 NOTE — Telephone Encounter (Signed)
Also, I think it would be best to obtain echo before scheduling stress test.  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  Nelva Bush, MD [place] Pager: [phone]

## 2019-01-02 NOTE — Telephone Encounter (Signed)
Please schedule Lexiscan Myoview for diagnosis of CDL clearance.  Given patient's body habitus I do have concerns there will be significant limitation of noninvasive evaluation, especially in a patient that denies any symptoms concerning for angina.  However DOT guidelines appear to require this stress test.  Patient would like to have this ran on Monday, 01/06/2019.  Echo is scheduled for later this afternoon.

## 2019-01-02 NOTE — Telephone Encounter (Signed)
Patient seen by Christell Faith, PA earlier this week with detailed plan outlined in his note.  I will forward to him regarding timing of these tests.  Nelva Bush, MD [facility] Pager: [phone]

## 2019-01-02 NOTE — Telephone Encounter (Signed)
I spoke with Vangie Bicker (on DPR). 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  She is requesting this as soon as possible (Monday if possible). Will send to Dr. Saunders Revel to see if OK to order.

## 2019-01-02 NOTE — Telephone Encounter (Signed)
Patient is scheduled for an echo today 01/02/19 @4pm .  Will await echo results and provider recommendation before scheduling the [place].

## 2019-01-03 NOTE — Telephone Encounter (Signed)
Echo shows LVEF 40-45%.  It is reasonable to proceed with pharmacologic myocardial perfusion stress test as planned next week.

## 2019-01-06 ENCOUNTER — Telehealth: Payer: Self-pay | Admitting: Family Medicine

## 2019-01-06 NOTE — Telephone Encounter (Signed)
Call to pt to review results and orders from Christell Faith, [place].   I placed orders for CT aorta and myoview.   Instructions reviewed orally and sent by letter to pt home.   Pt scheduled for myoview wed 7/8.   Encouraged pt to call DOT medical examiner and let them know of aorta changes. Pt agreed.   Pt requested I send results by fax to DOT. Sent to # [phone].  Advised pt to call for any further questions or concerns.

## 2019-01-06 NOTE — Telephone Encounter (Signed)
Called and spoke with [name]. Message relayed. She verbalized understanding.

## 2019-01-06 NOTE — Telephone Encounter (Signed)
I would advise her to call his cardiologist- he should be able to get that sorted for her.

## 2019-01-06 NOTE — Telephone Encounter (Signed)
Dunn, Ryan M, [name]  P Cv Div [place] Triage        Echo was overall challenging image quality in the setting of the patient's body habitus. Pump function is slightly reduced with an EF of 40-45%, slightly stiffened heart, mildly elevated right-sided pressure, moderately dilated left atrium, mildly narrowed aortic valve, dilated ascending aorta measuring 4.6 cm.   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 Please schedule the patient for a CTA aorta to evaluate his dilated ascending aorta.  -Pump function remains slightly low, though is stable. He remains on lisinopril, Toprol XL, and spironolactone.  -Continue with planned Lexiscan Myoview.  -His mild aortic stenosis is stable when compared to prior echo and will be followed up with an echo in 12 months.    Called to review results and schedule next available stress test. NA, LMTCB.

## 2019-01-06 NOTE — Telephone Encounter (Signed)
Copied from [place] [id number]. Topic: General - Other >> Jan 06, 2019  9:57 AM Oneta Rack wrote: Caller name: [name]  Relation to pt:  friend  Call back number: [phone]    Reason for call:  Patient in need of a Stress test in order to complete DOT physical form that is due in a few weeks, please advise

## 2019-01-07 NOTE — Telephone Encounter (Signed)
Call to patient to make him aware that after the radiologist reviewed his chart, they said that a 2 day stress test would be more appropriate for the patient.   Test will be wed/thursday with a start time of 8 am.   I left detailed message and encouraged pt to call back for any further questions.   Advised pt to call for any further questions or concerns.

## 2019-01-08 ENCOUNTER — Ambulatory Visit: Payer: Self-pay | Admitting: *Deleted

## 2019-01-08 ENCOUNTER — Encounter: Payer: Self-pay | Admitting: Family Medicine

## 2019-01-08 LAB — PROTIME-INR: INR: 2.6 — AB (ref 0.9–1.1)

## 2019-01-10 ENCOUNTER — Other Ambulatory Visit: Payer: Self-pay

## 2019-01-10 ENCOUNTER — Telehealth: Payer: Self-pay | Admitting: Internal Medicine

## 2019-01-10 ENCOUNTER — Ambulatory Visit
Admission: RE | Admit: 2019-01-10 | Discharge: 2019-01-10 | Disposition: A | Discharge status: Home / Self Care | Payer: Medicare Other | Source: Ambulatory Visit | Attending: Internal Medicine | Admitting: Internal Medicine

## 2019-01-10 DIAGNOSIS — I712 Thoracic aortic aneurysm, without rupture, unspecified: Secondary | ICD-10-CM

## 2019-01-10 DIAGNOSIS — I7121 Aneurysm of the ascending aorta, without rupture: Secondary | ICD-10-CM

## 2019-01-10 MED ORDER — IOPAMIDOL (ISOVUE-370) INJECTION 76%
100.0000 mL | Freq: Once | INTRAVENOUS | Status: AC | PRN
  Administered 2019-01-10: 100 mL via INTRAVENOUS

## 2019-01-10 NOTE — Addendum Note (Signed)
Addended by: Lamar Laundry on: 01/10/2019 01:35 PM   Modules accepted: Orders

## 2019-01-10 NOTE — Telephone Encounter (Signed)
Spoke with the patient's caregiver, [name] (on dpr). We will again reach out to them first of the upcoming week to schedule [facility] and follow up with appointment with CV[facility].

## 2019-01-10 NOTE — Telephone Encounter (Signed)
Spoke with [facility] scheduling and cancelled the pt 01/13/19 lexiscan. Urgent ref place to TCTS Dx TAA (severly dilated ascending aorta measuring up to 5.5cm). They will contact the pt to schedule the consult.

## 2019-01-10 NOTE — Telephone Encounter (Signed)
I spoke with Roberto [name] significant other, Vangie Bicker, regarding the results of his CTA of the chest, as he is currently unavailable.  Notable findings included moderately to severely dilated ascending aorta, measuring up to 5.5 cm.  This has grown compared with prior CTA in 2017 (5.3 cm).  Aorta was moderately dilated on most recent echo.  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  We will also cancel upcoming myocardial perfusion stress test, as he will likely require cardiac catheterization anyway for surgical planning related to aorta repair.  Nelva Bush, MD [facility] Pager: [phone]

## 2019-01-16 ENCOUNTER — Encounter: Payer: Medicare Other | Admitting: Cardiothoracic Surgery

## 2019-01-16 ENCOUNTER — Other Ambulatory Visit: Payer: Self-pay

## 2019-01-17 ENCOUNTER — Institutional Professional Consult (permissible substitution) (INDEPENDENT_AMBULATORY_CARE_PROVIDER_SITE_OTHER): Payer: Medicare Other | Admitting: Cardiothoracic Surgery

## 2019-01-17 VITALS — BP 109/66 | HR 54 | Temp 97.8°F | Resp 20 | Ht 66.0 in | Wt 352.0 lb

## 2019-01-17 DIAGNOSIS — I712 Thoracic aortic aneurysm, without rupture, unspecified: Secondary | ICD-10-CM
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
## 2019-01-17 NOTE — Telephone Encounter (Addendum)
This is my first time in triage this week and just seeing this message. Patient was seen by TCTS today and was recommended that he follow up with CHF clinic. Will need R/LHC scheduled.

## 2019-01-20 ENCOUNTER — Telehealth: Payer: Self-pay | Admitting: Internal Medicine

## 2019-01-20 ENCOUNTER — Telehealth: Payer: Self-pay | Admitting: Family Medicine

## 2019-01-20 NOTE — Telephone Encounter (Signed)
Spoke with patient's friend, [name]. She verbalized understanding to change procedure to Tuesday, July 28th. She is aware patient will need to go on Friday to [facility]

## 2019-01-20 NOTE — Telephone Encounter (Signed)
Please set him up for visit to discuss bridging with lovenox for cardiac cath. Thanks!

## 2019-01-20 NOTE — Telephone Encounter (Signed)
[name] calling back for update on coumadin. I let her know I was waiting to hear back from Dr End and patient's PCP regarding plan. Advised her not to have him get the COVID screening in the morning until she's heard from me about rescheduling procedure. She was appreciative.

## 2019-01-20 NOTE — Telephone Encounter (Signed)
Patient should hold warfarin now and will need Lovenox bridging.  Nelva Bush, MD [facility] Pager: [phone]

## 2019-01-20 NOTE — Telephone Encounter (Signed)
-----   Message from Nelva Bush, MD sent at 01/20/2019  2:01 PM EDT ----- Regarding: Anticoagulation and heart cath Hi Dr. Wynetta Emery,  I wanted to touch base with you about our mutual patient, Roberto [name].  His thoracic aortic aneurysm has enlarged and may need to be repaired in the near future.  In anticipation of this, we would like to arrange for a cardiac catheterization in the next 1-2 weeks, which would require interruption of his warfarin and bridging with Lovenox.  As you have been managing his anticoagulation, would you like to direct this bridging process?  If you prefer, I can have our anticoagulation clinic assist with this.  Please let me know what works best for you.  Thanks for your help.  [place]

## 2019-01-20 NOTE — Telephone Encounter (Signed)
We do not manage pt's coumadin here - perhaps his PCP's office?

## 2019-01-20 NOTE — Telephone Encounter (Signed)
Returning a phone call from a nurse, states there was not a voicemail left. Would like to be called back please.

## 2019-01-20 NOTE — Telephone Encounter (Signed)
Called and spoke with patient's friend, [name], ok per DPR. They are agreeable to Left/Right heart cath on 01/23/19 at 7:30 am, arrival of 6:30 am. [name] is aware patient needs to go to the [facility] for lab work in the morning and then go directly to the drive up COVID test screening between 0800 to 10:30 am. She is aware patient will need to be quarantined from the time he has the COVID screen collected and his procedure. In review of medication list, patient is taking Coumadin.  Advised I will route to Dr End, for advice on what time frame to hold coumadin and if 7/23 is ok timeframe.

## 2019-01-20 NOTE — Telephone Encounter (Signed)
Hi Mandi,  Are we able to bridge patient prior to Thursday? If needed, I can reschedule procedure for next week.

## 2019-01-20 NOTE — Telephone Encounter (Signed)
Duplicate. Closing encounter.

## 2019-01-20 NOTE — Telephone Encounter (Signed)
Dr. Durenda Age office will coordinate anticoagulation, including bridging with Lovenox.  If possible, can we schedule right and left heart catheterization for next Tuesday (01/28/2019)?  Thanks.  Nelva Bush, MD [facility] Pager: [phone]

## 2019-01-21 ENCOUNTER — Ambulatory Visit (INDEPENDENT_AMBULATORY_CARE_PROVIDER_SITE_OTHER): Payer: Medicare Other | Admitting: Family Medicine

## 2019-01-21 ENCOUNTER — Other Ambulatory Visit: Payer: Self-pay

## 2019-01-21 ENCOUNTER — Encounter: Payer: Self-pay | Admitting: Family Medicine

## 2019-01-21 DIAGNOSIS — Z7901 Long term (current) use of anticoagulants: Secondary | ICD-10-CM | POA: Diagnosis not present

## 2019-01-21 DIAGNOSIS — Z952 Presence of prosthetic heart valve: Secondary | ICD-10-CM

## 2019-01-21 MED ORDER — ENOXAPARIN SODIUM 40 MG/0.4ML ~~LOC~~ SOLN: 120.0000 mg | Freq: Two times a day (BID) | SUBCUTANEOUS | 0 refills | Status: DC

## 2019-01-21 NOTE — Patient Instructions (Addendum)
Take your Coumadin tonight and tomorrow (7/21 and 7/22) Thursday (7/23) Take Lovenox 120mg  2x a day and your coumadin at night Friday just take your lovenox 2x a day- stop the coumadin Saturday and Sunday (7/25 and 7/26) Just take the lovenox 2x a day Monday- just take the lovenox, but check your INR- you'll have a phone call with me to make sure it's where it needs to be for the cath on Tuesday Do not take your lovenox on Tuesday AM Restart your lovenox on Wednesday (2x a day) Take your lovenox and your coumadin Thursday 7/30 and until you talk to me on 8/3 8/3 we'll check your INR and hopefully you can stop your lovenox

## 2019-01-21 NOTE — Progress Notes (Signed)
There were no vitals taken for this visit.   Subjective:    Patient ID: Neri Samek, male    DOB: [birth year], 63 y.o.   MRN: [id number]  HPI: Adarryl Goldammer is a 63 y.o. male  Chief Complaint  Patient presents with  . ptinr   Coumadin Management.  The expected duration of coumadin treatment is lifelong The reason for anticoagulation is  mechanical heart valve.  Present Coumadin dose: 4, 4, 7.5 alternating- last took 7.5 Goal: 2.5-3.5  Excessive bruising: no Nose bleeding: no Rectal bleeding: no Prolonged menstrual cycles: N/A Eating diet with consistent amounts of foods containing Vitamin K:yes Any recent antibiotic use? no   He is scheduled to have a cardiac cath on Tuesday 01/28/19. He will need to be bridged with lovenox for the procedure. He has taken lovenox before.    Relevant past medical, surgical, family and social history reviewed and updated as indicated. Interim medical history since our last visit reviewed. Allergies and medications reviewed and updated.  Review of Systems  Constitutional: Negative.   HENT: Negative.   Respiratory: Positive for shortness of breath. Negative for apnea, cough, choking, chest tightness, wheezing and stridor.   Cardiovascular: Negative.   Gastrointestinal: Negative.   Psychiatric/Behavioral: Negative.     Per HPI unless specifically indicated above     Objective:    There were no vitals taken for this visit.  Wt Readings from Last 3 Encounters:  01/17/19 (!) 352 lb (159.7 kg)  12/31/18 (!) 352 lb (159.7 kg)  11/12/18 (!) 341 lb 6.4 oz (154.9 kg)    Physical Exam Vitals signs and nursing note reviewed.  Pulmonary:     Effort: Pulmonary effort is normal. No respiratory distress.     Comments: Speaking in full sentences Neurological:     Mental Status: He is alert.  Psychiatric:        Mood and Affect: Mood normal.        Behavior: Behavior normal.        Thought Content: Thought content normal.      Judgment: Judgment normal.    Results for orders placed or performed in visit on 01/08/19  Protime-INR  Result Value Ref Range   INR 2.6 (A) 0.9 - 1.1      Assessment & Plan:   Problem List Items Addressed This Visit      Other   H/O mechanical aortic valve replacement - Primary    To have a cardiac cath next week. Will bridge his coumadin with lovenox. Start lovenox on Thursday. Stop Coumadin Friday. Check INR at home on Monday to confirm in normal range. Hold lovenox on Tuesday before procedure. Restart on Wednesday with lovenox and coumadin on Thursday- check INR on Monday and we'll see if he can go off lovenox          Follow up plan: Return Virtual visits Monday 7/27 and Monday 8/5 for INR.    Marland Kitchen This visit was completed via telephone due to the restrictions of the COVID-19 pandemic. All issues as above were discussed and addressed but no physical exam was performed. If it was felt that the patient should be evaluated in the office, they were directed there. The patient verbally consented to this visit. Patient was unable to complete an audio/visual visit due to Lack of equipment. Due to the catastrophic nature of the COVID-19 pandemic, this visit was done through audio contact only. . Location of the patient: home . Location of the provider: work .  Those involved with this call:  . Provider: Park Liter, DO . CMA: Tiffany Reel, CMA . Front Desk/Registration: Don Perking  . Time spent on call: 15 minutes on the phone discussing health concerns. 23 minutes total spent in review of patient's record and preparation of their chart.

## 2019-01-21 NOTE — Assessment & Plan Note (Addendum)
To have a cardiac cath next week. Will bridge his coumadin with lovenox. Start lovenox on Thursday. Stop Coumadin Friday. Check INR at home on Monday to confirm in normal range. Hold lovenox on Tuesday before procedure. Restart on Wednesday with lovenox and coumadin on Thursday- check INR on Monday and we'll see if he can go off lovenox

## 2019-01-22 ENCOUNTER — Telehealth: Payer: Self-pay

## 2019-01-22 MED ORDER — ENOXAPARIN SODIUM 150 MG/ML ~~LOC~~ SOLN: 120.0000 mg | Freq: Two times a day (BID) | SUBCUTANEOUS | 0 refills | Status: DC

## 2019-01-22 NOTE — Telephone Encounter (Signed)
Pharmacy calling to notify Dr. Wynetta Emery that enoxaparin (LOVENOX) 150 MG/ML injection  is costing over $200 because has no insurance. Even with good rx, the price is over $200. Wants to know if an alternate can be called in or if there is a patient assistance program?

## 2019-01-22 NOTE — Telephone Encounter (Signed)
Patients girlfriend called, the pharmacy wants to switch the  lovenox injection from 40mg  .4mg  120mg  Q12H which  is 3 injections at a time to 150mg  1.63ml, so that he can do one injection Q12H

## 2019-01-23 ENCOUNTER — Telehealth: Payer: Self-pay | Admitting: Internal Medicine

## 2019-01-23 ENCOUNTER — Telehealth: Payer: Self-pay | Admitting: Family Medicine

## 2019-01-23 DIAGNOSIS — Z01810 Encounter for preprocedural cardiovascular examination: Secondary | ICD-10-CM

## 2019-01-23 DIAGNOSIS — I712 Thoracic aortic aneurysm, without rupture, unspecified: Secondary | ICD-10-CM

## 2019-01-23 DIAGNOSIS — R0602 Shortness of breath: Secondary | ICD-10-CM

## 2019-01-23 NOTE — Telephone Encounter (Signed)
Spoke with pharmacy and with [name]. [name] stated that she was told if they couldn't afford it we could give it to them here in the office? I advised that we did not carry that medication but I would double check with provider. Did encourage patient to reach out to community resources/churches/etc. To see if they could find any assistance. [name] also wanted to know when pt was supposed to start this medication. Please advise.

## 2019-01-23 NOTE — Telephone Encounter (Signed)
Unfortunately there is not really another option. I will copy the CCM pharmacist on this, but he needs to be bridged for his procedure.

## 2019-01-23 NOTE — Telephone Encounter (Signed)
Patient spouse [name] calling Patient has a cath scheduled 7/28 Patient would like an explanation on what it all entails - would like some education on what a cath is and what to expect Please call to discuss

## 2019-01-23 NOTE — Telephone Encounter (Signed)
Copied from [place] [id number]. Topic: General - Inquiry >> Jan 22, 2019  8:55 AM Richardo Priest, NT wrote: Reason for CRM: Patient's friend called in stating she is still waiting for an email from office with instructions to assist with shots for patient. Email is [email or web address]. Please advise. Ms.Moser also stated that pharmacy is going to be sending a fax to office to clear up medication concerns dealing with injections. Call back is [phone].

## 2019-01-23 NOTE — Telephone Encounter (Signed)
[facility] is going to get patient the full 28 days for $75 dollars. Roberto [name] would like written instruction on how to administer this medication. She would like it sent via mychart and printed for her to pick up.
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
## 2019-01-23 NOTE — Telephone Encounter (Signed)
Spoke with [name], on DPR. She verbalized understanding of the follow pre-procedural instructions and process for patient: On 01/24/19 - [facility] and then go to drive through [employer] testing at the medical arts.   [facility] Cardiac Cath Instructions   You are scheduled for a Cardiac Cath on:____07/28/2020__________  Please arrive at __06:30_am on the day of your procedure  Please expect a call from our [place] to pre-register you  Do not eat/drink anything after midnight  Someone will need to drive you home  It is recommended someone be with you for the first 24 hours after your procedure  Wear clothes that are easy to get on/off and wear slip on shoes if possible   Medications bring a current list of all medications with you  _XX_ Do not take these medications before your procedure:________Furosemide, spironolactone and follow lovenox bridging instructions from your Primary Care Provider.  Day of your procedure: Arrive at the [facility] entrance.  Free valet service is available.  After entering the [facility] please check-in at the registration desk (1st desk on your right) to receive your armband. After receiving your armband someone will escort you to the cardiac cath/special procedures waiting area.  The usual length of stay after your procedure is about 2 to 3 hours.  This can vary.  If you have any questions, please call our office at [phone], or you may call the cardiac cath lab at [facility] directly at [phone]

## 2019-01-23 NOTE — Telephone Encounter (Signed)
Printed and placed at front desk for pick up.

## 2019-01-23 NOTE — Telephone Encounter (Signed)
See other telephone encounters regarding this subject.

## 2019-01-23 NOTE — Telephone Encounter (Signed)
My boss and I exhausted all possible options this morning -   -[facility] does not have any in stock that they could help with  -He likely would qualify for [employer] patient assistance, but processing is 2-4 business days, and then shipping is 7-10 business days -Called the Coumadin clinic pharmacists, they don't have any samples or any other back up options.   Dr. Wynetta Emery, did you intend 120 mg, or 150 mg? Pt's actual weight of 159 kg at 1 mg/kg BID would be closer to 150 mg BID. Just wanted to clarify.  Is this price for the 120 mg syringes or 3-40 mg syringes? The way I read the initial message for the pharmacy, they just didn't have any 120 mg in stock, only 40s (and we are NOT having the patient do 3 subsequent injections BID) so wanted to see if could change to 150 mg syringe. The product does come as 120 mg syringes, I would recommend finding a pharmacy that has these in stock if this is the intended dose. Per the [employer], the price for 14 syringes at [facility] is $122  https://www.goodrx.com/enoxaparin?dosag120mg -0.28ml&form=syringe&label_override=enoxaparin&quantity=14

## 2019-01-23 NOTE — Telephone Encounter (Signed)
He can reach out to his cardiologst to see if they have another option, but I do not have another option.

## 2019-01-24 ENCOUNTER — Other Ambulatory Visit: Payer: Self-pay

## 2019-01-24 ENCOUNTER — Other Ambulatory Visit
Admission: RE | Admit: 2019-01-24 | Discharge: 2019-01-24 | Disposition: A | Discharge status: Home / Self Care | Payer: Medicare Other | Source: Ambulatory Visit | Attending: Internal Medicine | Admitting: Internal Medicine

## 2019-01-24 DIAGNOSIS — E785 Hyperlipidemia, unspecified: Secondary | ICD-10-CM | POA: Insufficient documentation

## 2019-01-24 DIAGNOSIS — I5022 Chronic systolic (congestive) heart failure: Secondary | ICD-10-CM | POA: Diagnosis not present

## 2019-01-24 DIAGNOSIS — Z1159 Encounter for screening for other viral diseases: Secondary | ICD-10-CM | POA: Insufficient documentation

## 2019-01-24 DIAGNOSIS — Z01812 Encounter for preprocedural laboratory examination: Secondary | ICD-10-CM | POA: Insufficient documentation

## 2019-01-24 DIAGNOSIS — Z8673 Personal history of transient ischemic attack (TIA), and cerebral infarction without residual deficits: Secondary | ICD-10-CM | POA: Diagnosis not present

## 2019-01-24 DIAGNOSIS — Z79899 Other long term (current) drug therapy: Secondary | ICD-10-CM | POA: Insufficient documentation

## 2019-01-24 DIAGNOSIS — I110 Hypertensive heart disease with heart failure: Secondary | ICD-10-CM | POA: Insufficient documentation

## 2019-01-24 DIAGNOSIS — F17210 Nicotine dependence, cigarettes, uncomplicated: Secondary | ICD-10-CM | POA: Insufficient documentation

## 2019-01-24 DIAGNOSIS — I428 Other cardiomyopathies: Secondary | ICD-10-CM | POA: Insufficient documentation

## 2019-01-24 LAB — CBC WITH DIFFERENTIAL/PLATELET
Abs Immature Granulocytes: 0.04 10*3/uL (ref 0.00–0.07)
Basophils Absolute: 0.1 10*3/uL (ref 0.0–0.1)
Basophils Relative: 1 %
Eosinophils Absolute: 0.3 10*3/uL (ref 0.0–0.5)
Eosinophils Relative: 4 %
HCT: 43.7 % (ref 39.0–52.0)
Hemoglobin: 14.3 g/dL (ref 13.0–17.0)
Immature Granulocytes: 1 %
Lymphocytes Relative: 27 %
Lymphs Abs: 2 10*3/uL (ref 0.7–4.0)
MCH: 29.8 pg (ref 26.0–34.0)
MCHC: 32.7 g/dL (ref 30.0–36.0)
MCV: 91 fL (ref 80.0–100.0)
Monocytes Absolute: 0.8 10*3/uL (ref 0.1–1.0)
Monocytes Relative: 11 %
Neutro Abs: 4.1 10*3/uL (ref 1.7–7.7)
Neutrophils Relative %: 56 %
Platelets: 202 10*3/uL (ref 150–400)
RBC: 4.8 MIL/uL (ref 4.22–5.81)
RDW: 13.8 % (ref 11.5–15.5)
WBC: 7.3 10*3/uL (ref 4.0–10.5)
nRBC: 0 % (ref 0.0–0.2)

## 2019-01-25 ENCOUNTER — Emergency Department: Payer: Medicare Other

## 2019-01-25 ENCOUNTER — Emergency Department
Admission: EM | Admit: 2019-01-25 | Discharge: 2019-01-25 | Disposition: A | Discharge status: Home / Self Care | Payer: Medicare Other | Attending: Emergency Medicine | Admitting: Emergency Medicine

## 2019-01-25 ENCOUNTER — Other Ambulatory Visit: Payer: Self-pay

## 2019-01-25 DIAGNOSIS — R110 Nausea: Secondary | ICD-10-CM | POA: Insufficient documentation

## 2019-01-25 DIAGNOSIS — Z8673 Personal history of transient ischemic attack (TIA), and cerebral infarction without residual deficits: Secondary | ICD-10-CM | POA: Insufficient documentation

## 2019-01-25 DIAGNOSIS — Z952 Presence of prosthetic heart valve: Secondary | ICD-10-CM | POA: Insufficient documentation

## 2019-01-25 DIAGNOSIS — J449 Chronic obstructive pulmonary disease, unspecified: Secondary | ICD-10-CM | POA: Insufficient documentation

## 2019-01-25 DIAGNOSIS — I110 Hypertensive heart disease with heart failure: Secondary | ICD-10-CM | POA: Diagnosis not present

## 2019-01-25 DIAGNOSIS — Z139 Encounter for screening, unspecified: Secondary | ICD-10-CM | POA: Insufficient documentation

## 2019-01-25 DIAGNOSIS — F17210 Nicotine dependence, cigarettes, uncomplicated: Secondary | ICD-10-CM | POA: Diagnosis not present

## 2019-01-25 DIAGNOSIS — I5022 Chronic systolic (congestive) heart failure: Secondary | ICD-10-CM | POA: Insufficient documentation

## 2019-01-25 DIAGNOSIS — R51 Headache: Secondary | ICD-10-CM | POA: Diagnosis not present

## 2019-01-25 DIAGNOSIS — Z0001 Encounter for general adult medical examination with abnormal findings: Secondary | ICD-10-CM | POA: Diagnosis not present

## 2019-01-25 LAB — SARS CORONAVIRUS 2 (TAT 6-24 HRS): SARS Coronavirus 2: NEGATIVE

## 2019-01-25 NOTE — ED Notes (Signed)
First Nurse Note: Pt currently bridging from Coumadin to Lovenox, pt started Lovenox on Thursday. Pt checked his INR twice at home today, both times his INR was 1.8. Pt has started having pain in his right temple and some nausea. Pts PCP recommended that pt be evaluated. Pt is in NAD at this time.

## 2019-01-25 NOTE — ED Provider Notes (Signed)
[facility] Emergency Department Provider Note       Time seen: ----------------------------------------- 4:25 PM on 01/25/2019 -----------------------------------------   I have reviewed the triage vital signs and the nursing notes.  HISTORY   Chief Complaint Headache    HPI Roberto [name] is a 63 y.o. male with a history of CHF, cellulitis, clotting disorder, hyperlipidemia, CVA who presents to the ED for headache and nausea.  Patient arrives from home complaining of same.  He reports being tapered off Coumadin for procedure on Tuesday.  He reports headache and some nausea with no vomiting.  He was told by his doctor to come to the ER.  He currently denies any headache.  Past Medical History:  Diagnosis Date  . Bicuspid aortic valve    a. s/p #27 Carbomed[employer] mechanical valve on 03/25/2010; b. on Coumadin; c. TTE 12/17: EF 40-45%, moderately dilated LV with moderate LVH, AVR well-seated with 14 mmHg gradient, peak AV velocity 2.5 m/s, mild mitral valve thickening with mild MR, mildly dilated RV with mildly reduced contraction  . Cellulitis   . Chronic systolic CHF (congestive heart failure) ([place])    a. R/LHC 03/2010 showed no significant CAD, LVEDP 31 mmHg, mean AoV gradient 34 mmHg at rest and 47 mmHg with dobutamine 20 mcg/kg/min, AVA 1.0 cm^2, RA 31, RV 68/25, PA 68/47, PCWP 38. PA sat 65%. CO 6.2 L/min ([place]) and 5.3 L/min (thermodilution)  . Clotting disorder ([place])   . H/O mechanical aortic valve replacement 03/25/2010   a. #27 Carbomedics mechanical valve  . Hypercholesterolemia   . Renal infarct [facility]) 2017   Multiple right renal infarcts, likely embolic.  . Stroke ([place])   . TIA (transient ischemic attack) 05/2014    Patient Active Problem List   Diagnosis Date Noted  . Osteoarthritis of spine with radiculopathy, lumbar region 11/21/2018  . History of aortic stenosis 06/05/2018  . Chronic gout without tophus 04/03/2018  . Valvular heart disease  01/17/2018  . Morbid obesity ([place]) 01/17/2018  . Chronic systolic heart failure ([place]) 09/27/2016  . Renal infarct ([place]) 05/01/2016  . Diabetes mellitus type 2, diet-controlled ([place]) 04/19/2016  . Chronic bilateral low back pain without sciatica 04/19/2016  . Thoracic ascending aortic aneurysm ([place]) 03/16/2016  . Aortic atherosclerosis ([place]) 03/16/2016  . Tobacco abuse 03/14/2016  . NICM (nonischemic cardiomyopathy) ([place]) 03/02/2016  . COPD (chronic obstructive pulmonary disease) ([place]) 09/09/2015  . H/O mechanical aortic valve replacement 02/09/2015  . Essential hypertension 02/09/2015  . Hyperlipidemia 02/09/2015    Past Surgical History:  Procedure Laterality Date  . AORTIC VALVE REPLACEMENT    . CARDIAC CATHETERIZATION  03/21/2010   No significant CAD. Severe aortic stenosis. Severely elevated left and right heart filling pressures.  Marland Kitchen CARDIAC SURGERY  2009   CHF  . CARPAL TUNNEL RELEASE Left 2005  . TONSILLECTOMY  1962    Allergies Patient has no known allergies.  Social History Social History   Tobacco Use  . Smoking status: Current Every Day Smoker    Packs/day: 0.50    Years: 46.00    Pack years: 23.00    Types: Cigarettes    Start date: 07/16/2017  . Smokeless tobacco: Never Used  . Tobacco comment: 10 cigarettes a day  Substance Use Topics  . Alcohol use: No    Alcohol/week: 0.0 standard drinks  . Drug use: No   Review of Systems Constitutional: Negative for fever. Cardiovascular: Negative for chest pain. Respiratory: Negative for shortness of breath. Gastrointestinal: Negative for abdominal  pain, positive for nausea Musculoskeletal: Negative for back pain. Skin: Negative for rash. Neurological: Positive for headache  All systems negative/normal/unremarkable except as stated in the HPI  ____________________________________________   PHYSICAL EXAM:  VITAL SIGNS: ED Triage Vitals  Enc Vitals Group     BP 01/25/19 1426 131/75     Pulse Rate  01/25/19 1426 93     Resp 01/25/19 1426 20     Temp 01/25/19 1426 98.7 F (37.1 C)     Temp Source 01/25/19 1426 Oral     SpO2 01/25/19 1426 96 %     Weight 01/25/19 1426 (!) 352 lb (159.7 kg)     Height 01/25/19 1426 5\' 6"  (1.676 m)     Head Circumference --      Peak Flow --      Pain Score 01/25/19 1435 0     Pain Loc --      Pain Edu? --      Excl. in [place]? --    Constitutional: Alert and oriented. Well appearing and in no distress. Eyes: Conjunctivae are normal. Normal extraocular movements. ENT      Head: Normocephalic and atraumatic.      Nose: No congestion/rhinnorhea.      Mouth/Throat: Mucous membranes are moist.      Neck: No stridor. Cardiovascular: Normal rate, regular rhythm. No murmurs, rubs, or gallops. Respiratory: Normal respiratory effort without tachypnea nor retractions. Breath sounds are clear and equal bilaterally. No wheezes/rales/rhonchi. Gastrointestinal: Soft and nontender. Normal bowel sounds Musculoskeletal: Nontender with normal range of motion in extremities. No lower extremity tenderness nor edema. Neurologic:  Normal speech and language. No gross focal neurologic deficits are appreciated.  Skin:  Skin is warm, dry and intact. No rash noted. Psychiatric: Mood and affect are normal. Speech and behavior are normal.  ____________________________________________  ED COURSE:  As part of my medical decision making, I reviewed the following data within the [place] History obtained from family if available, nursing notes, old chart and ekg, as well as notes from prior ED visits. Patient presented for headache, we will assess with labs and imaging as indicated at this time.   Procedures  [name] was evaluated in Emergency Department on 01/25/2019 for the symptoms described in the history of present illness. He was evaluated in the context of the global COVID-19 pandemic, which necessitated consideration that the patient might be at  risk for infection with the SARS-CoV-2 virus that causes COVID-19. Institutional protocols and algorithms that pertain to the evaluation of patients at risk for COVID-19 are in a state of rapid change based on information released by regulatory bodies including the CDC and federal and state organizations. These policies and algorithms were followed during the patient's care in the ED.  ____________________________________________   RADIOLOGY Images were viewed by me  CT head Did not reveal any acute process ____________________________________________   DIFFERENTIAL DIAGNOSIS   Tension headache, migraine, subarachnoid hemorrhage, CVA  FINAL ASSESSMENT AND PLAN  Headache   Plan: The patient had presented for a recent headache.  His main concern was whether or not he was decreasing his Coumadin dose effectively.  It was 1.8 and his typical range was 2.5-3.5.  He is scheduled to have a heart catheterization on Tuesday.  Currently he is taking Lovenox shots.  He has no neurologic symptoms and his headache is gone.  He is cleared for outpatient follow-up.   Laurence Aly, MD    Note: This note was generated  in part or whole with voice recognition software. Voice recognition is usually quite accurate but there are transcription errors that can and very often do occur. I apologize for any typographical errors that were not detected and corrected.     Earleen Newport, MD 01/25/19 [phone]

## 2019-01-25 NOTE — ED Triage Notes (Signed)
Pt presents from home via POV c/o headache and nausea. Reports being tapered off Coumadin for procedure Tuesday. Reports headache and some nausea, no emesis. Told by PCP to be seen in ED.

## 2019-01-25 NOTE — ED Triage Notes (Signed)
Pt currently denies headache.

## 2019-01-25 NOTE — ED Notes (Signed)
Verbal order given by Dr. Jimmye Norman for Head CT without contrast

## 2019-01-27 ENCOUNTER — Ambulatory Visit: Payer: Medicare Other | Admitting: Family Medicine

## 2019-01-27 ENCOUNTER — Encounter: Payer: Self-pay | Admitting: Family Medicine

## 2019-01-27 ENCOUNTER — Ambulatory Visit (INDEPENDENT_AMBULATORY_CARE_PROVIDER_SITE_OTHER): Payer: Medicare Other | Admitting: Family Medicine

## 2019-01-27 ENCOUNTER — Telehealth: Payer: Self-pay | Admitting: *Deleted

## 2019-01-27 ENCOUNTER — Other Ambulatory Visit: Payer: Self-pay

## 2019-01-27 VITALS — Ht 66.0 in | Wt 354.0 lb

## 2019-01-27 DIAGNOSIS — Z952 Presence of prosthetic heart valve: Secondary | ICD-10-CM

## 2019-01-27 NOTE — Progress Notes (Signed)
   Ht 5\' 6"  (1.676 m)   Wt (!) 354 lb (160.6 kg)   BMI 57.14 kg/m    Subjective:    Patient ID: Roberto [name], male    DOB: 11/08/1955, 63 y.o.   MRN: [id number]  CC: Coumadin management  HPI: This patient is a 63 y.o. male who presents for coumadin management. The expected duration of coumadin treatment is lifelong The reason for anticoagulation is  mechanical heart valve.  Present Coumadin dose: Holding your Coumadin- currently on lovenox Goal: 1.0   Excessive bruising: yes Nose bleeding: no Rectal bleeding: no Prolonged menstrual cycles: N/A Eating diet with consistent amounts of foods containing Vitamin K:no Any recent antibiotic use? no  Has not been feeling well. He notes that he had a bit of a headache in his temple for a few minutes and had a bit of an upset stomach- that is gone now. Feeling well. Feeling pretty normal.   Relevant past medical, surgical, family and social history reviewed and updated as indicated. Interim medical history since our last visit reviewed. Allergies and medications reviewed and updated.  ROS: Per HPI unless specifically indicated above     Objective:    Ht 5\' 6"  (1.676 m)   Wt (!) 354 lb (160.6 kg)   BMI 57.14 kg/m   Wt Readings from Last 3 Encounters:  01/27/19 (!) 354 lb (160.6 kg)  01/25/19 (!) 352 lb (159.7 kg)  01/17/19 (!) 352 lb (159.7 kg)     General: Well appearing, well nourished in no distress.  Normal mood and affect. Skin: No excessive bruising or rash  Last INR: 1.3    Last CBC:  Lab Results  Component Value Date   WBC 7.3 01/24/2019   HGB 14.3 01/24/2019   HCT 43.7 01/24/2019   MCV 91.0 01/24/2019   PLT 202 01/24/2019    Results for orders placed or performed during the hospital encounter of 01/24/19  SARS Coronavirus 2 (Performed in [facility] hospital lab)   Specimen: Nasal Swab  Result Value Ref Range   SARS Coronavirus 2 NEGATIVE NEGATIVE       Assessment:     ICD-10-CM   1. H/O mechanical  aortic valve replacement  Z95.2     Plan:   Discussed current plan face-to-face with patient. For coumadin dosing, elected to hold dose for surgery Continue lovenox as discussed last visit. Will plan to recheck INR in 1 week to see about coming off lovenox.

## 2019-01-27 NOTE — Telephone Encounter (Signed)
-----   Message from Nelva Bush, MD sent at 01/27/2019  7:35 AM EDT ----- Regarding: Precath labs Good morning,  It appears that BMP was ordered but not drawn as part of pre-cath labs for tomorrow's procedure.  Can you see if we can have Roberto [name] come in a little earlier than usual tomorrow so that we can check a STAT BMP when he arrives?  Thanks.  Gerald Stabs

## 2019-01-27 NOTE — Telephone Encounter (Signed)
Patient will come in at 6 am instead of 6:30 in order to have lab work drawn STAT prior to procedure. Patient and friend were notified earlier during being called for lab results.

## 2019-01-28 ENCOUNTER — Ambulatory Visit
Admission: RE | Admit: 2019-01-28 | Discharge: 2019-01-28 | Disposition: A | Discharge status: Home / Self Care | Payer: Medicare Other | Attending: Internal Medicine | Admitting: Internal Medicine

## 2019-01-28 ENCOUNTER — Telehealth: Payer: Self-pay | Admitting: Family Medicine

## 2019-01-28 ENCOUNTER — Encounter: Payer: Self-pay | Admitting: *Deleted

## 2019-01-28 ENCOUNTER — Other Ambulatory Visit: Payer: Self-pay

## 2019-01-28 ENCOUNTER — Telehealth: Payer: Self-pay

## 2019-01-28 ENCOUNTER — Encounter
Admission: RE | Disposition: A | Discharge status: Home / Self Care | Payer: Medicare Other | Source: Home / Self Care | Attending: Internal Medicine

## 2019-01-28 DIAGNOSIS — Z7982 Long term (current) use of aspirin: Secondary | ICD-10-CM | POA: Diagnosis not present

## 2019-01-28 DIAGNOSIS — I712 Thoracic aortic aneurysm, without rupture, unspecified: Secondary | ICD-10-CM | POA: Diagnosis present

## 2019-01-28 DIAGNOSIS — Z8673 Personal history of transient ischemic attack (TIA), and cerebral infarction without residual deficits: Secondary | ICD-10-CM | POA: Insufficient documentation

## 2019-01-28 DIAGNOSIS — I428 Other cardiomyopathies: Secondary | ICD-10-CM | POA: Diagnosis not present

## 2019-01-28 DIAGNOSIS — Z6841 Body Mass Index (BMI) 40.0 and over, adult: Secondary | ICD-10-CM | POA: Diagnosis not present

## 2019-01-28 DIAGNOSIS — Z952 Presence of prosthetic heart valve: Secondary | ICD-10-CM | POA: Diagnosis not present

## 2019-01-28 DIAGNOSIS — I10 Essential (primary) hypertension: Secondary | ICD-10-CM

## 2019-01-28 DIAGNOSIS — Z7951 Long term (current) use of inhaled steroids: Secondary | ICD-10-CM | POA: Diagnosis not present

## 2019-01-28 DIAGNOSIS — F17210 Nicotine dependence, cigarettes, uncomplicated: Secondary | ICD-10-CM | POA: Insufficient documentation

## 2019-01-28 DIAGNOSIS — E7800 Pure hypercholesterolemia, unspecified: Secondary | ICD-10-CM | POA: Diagnosis not present

## 2019-01-28 DIAGNOSIS — I5022 Chronic systolic (congestive) heart failure: Secondary | ICD-10-CM

## 2019-01-28 DIAGNOSIS — Z7901 Long term (current) use of anticoagulants: Secondary | ICD-10-CM | POA: Diagnosis not present

## 2019-01-28 DIAGNOSIS — I2510 Atherosclerotic heart disease of native coronary artery without angina pectoris: Secondary | ICD-10-CM | POA: Insufficient documentation

## 2019-01-28 DIAGNOSIS — Z8249 Family history of ischemic heart disease and other diseases of the circulatory system: Secondary | ICD-10-CM | POA: Insufficient documentation

## 2019-01-28 DIAGNOSIS — Z79899 Other long term (current) drug therapy: Secondary | ICD-10-CM | POA: Diagnosis not present

## 2019-01-28 DIAGNOSIS — I77810 Thoracic aortic ectasia: Secondary | ICD-10-CM

## 2019-01-28 DIAGNOSIS — I350 Nonrheumatic aortic (valve) stenosis: Secondary | ICD-10-CM | POA: Diagnosis not present

## 2019-01-28 DIAGNOSIS — I110 Hypertensive heart disease with heart failure: Secondary | ICD-10-CM | POA: Insufficient documentation

## 2019-01-28 DIAGNOSIS — Q231 Congenital insufficiency of aortic valve: Secondary | ICD-10-CM | POA: Diagnosis not present

## 2019-01-28 DIAGNOSIS — E785 Hyperlipidemia, unspecified: Secondary | ICD-10-CM | POA: Diagnosis not present

## 2019-01-28 HISTORY — PX: RIGHT HEART CATH AND CORONARY ANGIOGRAPHY: CATH118264

## 2019-01-28 LAB — BASIC METABOLIC PANEL
Anion gap: 10 (ref 5–15)
BUN: 24 mg/dL — ABNORMAL HIGH (ref 8–23)
CO2: 22 mmol/L (ref 22–32)
Calcium: 9.1 mg/dL (ref 8.9–10.3)
Chloride: 105 mmol/L (ref 98–111)
Creatinine, Ser: 1.13 mg/dL (ref 0.61–1.24)
GFR calc Af Amer: >60 mL/min (ref >60)
GFR calc non Af Amer: >60 mL/min (ref >60)
Glucose, Bld: 119 mg/dL — ABNORMAL HIGH (ref 70–99)
Potassium: 4.4 mmol/L (ref 3.5–5.1)
Sodium: 137 mmol/L (ref 135–145)

## 2019-01-28 LAB — PROTIME-INR
INR: 1.1 (ref 0.8–1.2)
Prothrombin Time: 13.8 seconds (ref 11.4–15.2)

## 2019-01-28 SURGERY — RIGHT HEART CATH AND CORONARY ANGIOGRAPHY

## 2019-01-28 MED ORDER — MIDAZOLAM HCL 2 MG/2ML IJ SOLN
INTRAMUSCULAR | Status: AC
  Filled 2019-01-28: qty 2

## 2019-01-28 MED ORDER — HEPARIN (PORCINE) IN NACL 1000-0.9 UT/500ML-% IV SOLN
INTRAVENOUS | Status: AC
  Filled 2019-01-28: qty 1000

## 2019-01-28 MED ORDER — VERAPAMIL HCL 2.5 MG/ML IV SOLN
INTRAVENOUS | Status: AC
  Filled 2019-01-28: qty 2

## 2019-01-28 MED ORDER — HEPARIN SODIUM (PORCINE) 1000 UNIT/ML IJ SOLN
INTRAMUSCULAR | Status: AC
  Filled 2019-01-28: qty 1

## 2019-01-28 MED ORDER — IOHEXOL 300 MG/ML  SOLN
INTRAMUSCULAR | Status: DC | PRN
  Administered 2019-01-28: 75 mL via INTRA_ARTERIAL

## 2019-01-28 MED ORDER — FUROSEMIDE 10 MG/ML IJ SOLN
INTRAMUSCULAR | Status: AC
  Administered 2019-01-28: 10:00:00 20 mg via INTRAVENOUS
  Filled 2019-01-28: qty 2

## 2019-01-28 MED ORDER — FENTANYL CITRATE (PF) 100 MCG/2ML IJ SOLN
INTRAMUSCULAR | Status: DC | PRN
  Administered 2019-01-28: 12.5 ug via INTRAVENOUS

## 2019-01-28 MED ORDER — SODIUM CHLORIDE 0.9% FLUSH: 3.0000 mL | Freq: Two times a day (BID) | INTRAVENOUS | Status: DC

## 2019-01-28 MED ORDER — HEPARIN SODIUM (PORCINE) 1000 UNIT/ML IJ SOLN
INTRAMUSCULAR | Status: DC | PRN
  Administered 2019-01-28: 5000 [IU] via INTRAVENOUS

## 2019-01-28 MED ORDER — FUROSEMIDE 40 MG PO TABS: 40.0000 mg | ORAL_TABLET | Freq: Two times a day (BID) | ORAL | 3 refills | Status: DC

## 2019-01-28 MED ORDER — SODIUM CHLORIDE 0.9 % IV SOLN: 250.0000 mL | INTRAVENOUS | Status: DC | PRN

## 2019-01-28 MED ORDER — ASPIRIN 81 MG PO CHEW: 81.0000 mg | CHEWABLE_TABLET | ORAL | Status: DC

## 2019-01-28 MED ORDER — MIDAZOLAM HCL 2 MG/2ML IJ SOLN
INTRAMUSCULAR | Status: DC | PRN
  Administered 2019-01-28: 0.5 mg via INTRAVENOUS

## 2019-01-28 MED ORDER — FUROSEMIDE 10 MG/ML IJ SOLN
20.0000 mg | Freq: Once | INTRAMUSCULAR | Status: AC
  Administered 2019-01-28: 10:00:00 20 mg via INTRAVENOUS

## 2019-01-28 MED ORDER — HEPARIN (PORCINE) IN NACL 1000-0.9 UT/500ML-% IV SOLN
INTRAVENOUS | Status: DC | PRN
  Administered 2019-01-28: 500 mL

## 2019-01-28 MED ORDER — FENTANYL CITRATE (PF) 100 MCG/2ML IJ SOLN
INTRAMUSCULAR | Status: AC
  Filled 2019-01-28: qty 2

## 2019-01-28 MED ORDER — SODIUM CHLORIDE 0.9 % IV SOLN
INTRAVENOUS | Status: DC
  Administered 2019-01-28: 08:00:00 via INTRAVENOUS

## 2019-01-28 MED ORDER — VERAPAMIL HCL 2.5 MG/ML IV SOLN
INTRAVENOUS | Status: DC | PRN
  Administered 2019-01-28: 2.5 mg via INTRA_ARTERIAL

## 2019-01-28 MED ORDER — SODIUM CHLORIDE 0.9% FLUSH: 3.0000 mL | INTRAVENOUS | Status: DC | PRN

## 2019-01-28 SURGICAL SUPPLY — 13 items
CATH BALLN WEDGE 5F 110CM (CATHETERS) ×2 IMPLANT
CATH INFINITI 5 FR 3DRC (CATHETERS) ×2 IMPLANT
CATH INFINITI 5FR JL4 (CATHETERS) ×2 IMPLANT
CATH INFINITI JR4 5F (CATHETERS) ×2 IMPLANT
DEVICE RAD COMP TR BAND LRG (VASCULAR PRODUCTS) ×2 IMPLANT
GLIDESHEATH SLEND SS 6F .021 (SHEATH) ×2 IMPLANT
KIT MANI 3VAL PERCEP (MISCELLANEOUS) ×3 IMPLANT
KIT RIGHT HEART (MISCELLANEOUS) ×3 IMPLANT
PACK CARDIAC CATH (CUSTOM PROCEDURE TRAY) ×3 IMPLANT
SHEATH GLIDE SLENDER 4/5FR (SHEATH) ×2 IMPLANT
SHIELD X-DRAPE GOLD 12X17 (MISCELLANEOUS) ×2 IMPLANT
WIRE HITORQ VERSACORE ST 145CM (WIRE) ×2 IMPLANT
WIRE ROSEN-J .035X260CM (WIRE) ×2 IMPLANT

## 2019-01-28 NOTE — Telephone Encounter (Signed)
Patient discharged today- he will need TOC follow up call tomorrow.

## 2019-01-28 NOTE — Telephone Encounter (Signed)
TOC-Awaiting scheduling.

## 2019-01-28 NOTE — Telephone Encounter (Signed)
Called and left patient a VM asking for him to please return my call.  

## 2019-01-28 NOTE — Telephone Encounter (Signed)
-----   Message from Nelva Bush, MD sent at 01/28/2019  9:59 AM EDT ----- Regarding: Post-cath f/u Good morning,  Could you help set Mr. Monje up for a BMP in 1 week and f/u with me or an APP in ~2 weeks following today's catheterization?  Please let me know if any issues arise.  Thanks.  Gerald Stabs

## 2019-01-28 NOTE — Interval H&P Note (Signed)
History and Physical Interval Note:  01/28/2019 7:33 AM  Roberto [name]  has presented today for surgery, with the diagnosis of chronic systolic heart failure and thoracic aortic aneurysm.  The various methods of treatment have been discussed with the patient and family. After consideration of risks, benefits and other options for treatment, the patient has consented to  Procedure(s): RIGHT/LEFT HEART CATH AND CORONARY ANGIOGRAPHY (N/A) as a surgical intervention.  The patient's history has been reviewed, patient examined, no change in status, stable for surgery.  In addition, patient was found to have severely dilated thoracic aorta and has been seen by cardiac surgery for operative management.  Cardiac catheterization has been requested to help with operative planning. I have reviewed the patient's chart and labs.  Questions were answered to the patient's satisfaction.    Cath Lab Visit (complete for each Cath Lab visit)  Clinical Evaluation Leading to the Procedure:   ACS: No.  Non-ACS:    Anginal/HF Classification: NYHA class III  Anti-ischemic medical therapy: Minimal Therapy (1 class of medications)  Non-Invasive Test Results: No non-invasive testing performed  Prior CABG: No previous CABG  Roberto [name]

## 2019-01-28 NOTE — Telephone Encounter (Signed)
Had called pt's friend [name] to reschedule her appt. She is wondering when she should start Roberto [name]'s warfarin back. Please advise.

## 2019-01-28 NOTE — Telephone Encounter (Signed)
See below

## 2019-01-28 NOTE — Telephone Encounter (Signed)
Copied from [place] [id number]. Topic: General - Inquiry >> Jan 28, 2019 12:13 PM Scherrie Gerlach wrote: Reason for CRM: daughter calling to ask if pt should do the levenox inj today, or start coum? Pt had heart cath this am.  She states they told her Dr Wynetta Emery does his meds and she should follow up with her. Pt given pre procedure instructions, but not post.  P will wait to hear back before he takes any medication.

## 2019-01-28 NOTE — Telephone Encounter (Signed)
Per our discussion on 7/21 and given to her at that time:   Take your Coumadin tonight and tomorrow (7/21 and 7/22) Thursday (7/23) Take Lovenox 120mg  2x a day and your coumadin at night Friday just take your lovenox 2x a day- stop the coumadin Saturday and Sunday (7/25 and 7/26) Just take the lovenox 2x a day Monday- just take the lovenox, but check your INR- you'll have a phone call with me to make sure it's where it needs to be for the cath on Tuesday Do not take your lovenox on Tuesday AM Restart your lovenox on Wednesday (2x a day) Take your lovenox and your coumadin Thursday 7/30 and until you talk to me on 8/3 8/3 we'll check your INR and hopefully you can stop your lovenox

## 2019-01-28 NOTE — Telephone Encounter (Signed)
Called and left another VM asking for patient to please return my call.

## 2019-01-29 ENCOUNTER — Encounter: Payer: Self-pay | Admitting: Internal Medicine

## 2019-01-29 ENCOUNTER — Telehealth (HOSPITAL_COMMUNITY): Payer: Self-pay | Admitting: Cardiology

## 2019-01-29 ENCOUNTER — Other Ambulatory Visit: Payer: Medicare Other

## 2019-01-29 ENCOUNTER — Telehealth: Payer: Self-pay | Admitting: *Deleted

## 2019-01-29 NOTE — Telephone Encounter (Signed)
[name] notified

## 2019-01-29 NOTE — Telephone Encounter (Signed)
Patient has been scheduled to see Dr Aundra Dubin on 02/06/19.  Referral was placed on by Dr Orvan Seen.

## 2019-01-29 NOTE — Telephone Encounter (Signed)
Called PT to advise of appt that has been scheduled.  Lvm for PT will send new PT information via [employer].  Roberto [name]

## 2019-01-29 NOTE — Telephone Encounter (Signed)
-----   Message from Nelva Bush, MD sent at 01/28/2019  5:07 PM EDT ----- Claris Gladden,  It sounds like [facility] would like for Mr. Bertram to be seen by Dr. Haroldine Laws or Aundra Dubin in the HF clinic before his surgery.  Do you mind referring him to them for evaluation of chronic systolic heart failure, non-ischemic cardiomyopathy, and upcoming thoracic aortic aneurysm repair?  Thanks.  Gerald Stabs ----- Message ----- From: Wonda Olds, MD Sent: 01/28/2019   1:23 PM EDT To: Nelva Bush, MD  Maricela Bo. Thank you. I guess he's just deconditioned? I think I might have mentioned him seeing Heart Failure if you don't mind, but I can't remember for certain.  I'd like to be able to come to [facility] cath conference on behalf of [facility] if you think that's helpful.  Please call me Zane. Best, Z ----- Message ----- From: Nelva Bush, MD Sent: 01/28/2019   9:56 AM EDT To: Wonda Olds, MD  Hi Dr. Orvan Seen,  Mr. Kohles's cath today showed moderate (~60%) stenosis involving the first diagonal branch but otherwise no significant CAD.  His left and right heart pressures are moderately to severely elevated (not surprising); I am going to increase his furosemide and have him follow-up with [place] in a couple of weeks.  Please let me know if any questions or concerns come up.  Thanks.  Gerald Stabs

## 2019-01-31 NOTE — Telephone Encounter (Signed)
Patients friend [name] on [place]) called in to verify appointment on 8/6. She also stated the patient has a hard knot on incision sight on L hand/wrist area. Patient is curious to know if that is normal or not. Please advise

## 2019-01-31 NOTE — Telephone Encounter (Signed)
TOC- 1st attempt. lmtcb  

## 2019-01-31 NOTE — Telephone Encounter (Signed)
Returned [name]'s call. lmtcb.

## 2019-02-03 ENCOUNTER — Other Ambulatory Visit: Payer: Self-pay

## 2019-02-03 ENCOUNTER — Ambulatory Visit (INDEPENDENT_AMBULATORY_CARE_PROVIDER_SITE_OTHER): Payer: Medicare Other | Admitting: Family Medicine

## 2019-02-03 ENCOUNTER — Encounter: Payer: Self-pay | Admitting: Family Medicine

## 2019-02-03 DIAGNOSIS — Z952 Presence of prosthetic heart valve: Secondary | ICD-10-CM | POA: Diagnosis not present

## 2019-02-03 NOTE — Progress Notes (Signed)
BP 118/72   Pulse 64   Temp 98.5 F (36.9 C)    Subjective:    Patient ID: Roberto [name], male    DOB: [birth year], 63 y.o.   MRN: [id number]  CC: Coumadin management  HPI: This patient is a 63 y.o. male who presents for coumadin management. The expected duration of coumadin treatment is lifelong The reason for anticoagulation is  mechanical heart valve.  Present Coumadin dose: on lovenox- has been back on his coumadin since Wednesday- last night took  Goal: 2.5-3.5  Excessive bruising: no Nose bleeding: no Rectal bleeding: no Prolonged menstrual cycles: N/A Eating diet with consistent amounts of foods containing Vitamin K:yes Any recent antibiotic use? no  Relevant past medical, surgical, family and social history reviewed and updated as indicated. Interim medical history since our last visit reviewed. Allergies and medications reviewed and updated.  ROS: Per HPI unless specifically indicated above     Objective:    BP 118/72   Pulse 64   Temp 98.5 F (36.9 C)   Wt Readings from Last 3 Encounters:  01/28/19 (!) 354 lb 0.9 oz (160.6 kg)  01/27/19 (!) 354 lb (160.6 kg)  01/25/19 (!) 352 lb (159.7 kg)    Physical Exam Vitals signs and nursing note reviewed.  Pulmonary:     Effort: Pulmonary effort is normal. No respiratory distress.     Comments: Speaking in full sentences Neurological:     Mental Status: She is alert.  Psychiatric:        Mood and Affect: Mood normal.        Behavior: Behavior normal.        Thought Content: Thought content normal.        Judgment: Judgment normal.   Last INR: 1.4- this AM    Last CBC:  Lab Results  Component Value Date   WBC 7.3 01/24/2019   HGB 14.3 01/24/2019   HCT 43.7 01/24/2019   MCV 91.0 01/24/2019   PLT 202 01/24/2019    Results for orders placed or performed during the hospital encounter of 41/74/08  Basic metabolic panel  Result Value Ref Range   Sodium 137 135 - 145 mmol/L   Potassium 4.4 3.5 - 5.1  mmol/L   Chloride 105 98 - 111 mmol/L   CO2 22 22 - 32 mmol/L   Glucose, Bld 119 (H) 70 - 99 mg/dL   BUN 24 (H) 8 - 23 mg/dL   Creatinine, Ser 1.13 0.61 - 1.24 mg/dL   Calcium 9.1 8.9 - 10.3 mg/dL   GFR calc non Af Amer >60 >60 mL/min   GFR calc Af Amer >60 >60 mL/min   Anion gap 10 5 - 15  Protime-INR  Result Value Ref Range   Prothrombin Time 13.8 11.4 - 15.2 seconds   INR 1.1 0.8 - 1.2       Assessment:     ICD-10-CM   1. H/O mechanical aortic valve replacement  Z95.2     Plan:   Discussed current plan face-to-face with patient. For coumadin dosing, elected to continue current dose and continue lovenox as well.  Will plan to recheck INR in on Thursday.   . This visit was completed via telephone due to the restrictions of the COVID-19 pandemic. All issues as above were discussed and addressed but no physical exam was performed. If it was felt that the patient should be evaluated in the office, they were directed there. The patient verbally consented to this visit. Patient was  unable to complete an audio/visual visit due to Lack of equipment. Due to the catastrophic nature of the COVID-19 pandemic, this visit was done through audio contact only. . Location of the patient: home . Location of the provider: work . Those involved with this call:  . Provider: Park Liter, DO . CMA: Tiffany Reel, CMA . Front Desk/Registration: Don Perking  . Time spent on call: 15 minutes on the phone discussing health concerns. 23 minutes total spent in review of patient's record and preparation of their chart.

## 2019-02-03 NOTE — Telephone Encounter (Signed)
Esau Grew, RN at the [facility] said she would put a note in to have the labs drawn. Entering BMET as clinic collect and [facility] as recommended.

## 2019-02-03 NOTE — Telephone Encounter (Addendum)
Per Dr End, it is ok to reschedule appointment on 02/05/19 since patient has appointment with Dr Aundra Dubin on 02/06/19. Rescheduled patient to 02/26/19 with Dr End.  Discussed with patient's friend, [name], ok per DPR. She verbalized understanding of the appointment changes.  Regarding wrist, she states there is a dime-sized knot on his wrist at the site of the cath. It is sore and hard. He also has bruising around the area and up his arm.  The area has not increased in size since last week and has remained about the same. She denies any warmth, redness (other than the bruising).  She will continue to monitor the area and let us know if it gets worse. Routing to Dr End to make him aware.

## 2019-02-03 NOTE — Telephone Encounter (Signed)
Was speaking with patient's friend in another encounter and forgot to address this encounter. Called her back and left message with details that patient will need lab work Artist). Gave option in VM that could possibly be done at appointment with Dr Aundra Dubin or would need to go to the [facility]. Asked her to call me back to discuss.

## 2019-02-03 NOTE — Assessment & Plan Note (Signed)
INR still at 1.4- will continue coumadin and lovenox and recheck in 3 days

## 2019-02-03 NOTE — Telephone Encounter (Signed)
Staff message sent to Roberto Rosebush, RN at the [facility] in [place] to see if they can draw BMP on 8/6 appointment.

## 2019-02-05 ENCOUNTER — Ambulatory Visit: Payer: Medicare Other | Admitting: Internal Medicine

## 2019-02-05 LAB — POCT INR

## 2019-02-06 ENCOUNTER — Other Ambulatory Visit: Payer: Self-pay | Admitting: Family Medicine

## 2019-02-06 ENCOUNTER — Telehealth: Payer: Self-pay | Admitting: Family Medicine

## 2019-02-06 ENCOUNTER — Ambulatory Visit (INDEPENDENT_AMBULATORY_CARE_PROVIDER_SITE_OTHER): Payer: Medicare Other | Admitting: Family Medicine

## 2019-02-06 ENCOUNTER — Ambulatory Visit (HOSPITAL_COMMUNITY)
Admission: RE | Admit: 2019-02-06 | Discharge: 2019-02-06 | Disposition: A | Discharge status: Home / Self Care | Payer: Medicare Other | Source: Ambulatory Visit | Attending: Cardiology | Admitting: Cardiology

## 2019-02-06 ENCOUNTER — Other Ambulatory Visit: Payer: Self-pay

## 2019-02-06 ENCOUNTER — Encounter (HOSPITAL_COMMUNITY): Payer: Self-pay | Admitting: Cardiology

## 2019-02-06 VITALS — BP 103/76 | HR 99 | Wt 348.0 lb

## 2019-02-06 DIAGNOSIS — Z8673 Personal history of transient ischemic attack (TIA), and cerebral infarction without residual deficits: Secondary | ICD-10-CM | POA: Diagnosis not present

## 2019-02-06 DIAGNOSIS — Z8249 Family history of ischemic heart disease and other diseases of the circulatory system: Secondary | ICD-10-CM | POA: Insufficient documentation

## 2019-02-06 DIAGNOSIS — I428 Other cardiomyopathies: Secondary | ICD-10-CM | POA: Insufficient documentation

## 2019-02-06 DIAGNOSIS — Z7982 Long term (current) use of aspirin: Secondary | ICD-10-CM | POA: Insufficient documentation

## 2019-02-06 DIAGNOSIS — Q231 Congenital insufficiency of aortic valve: Secondary | ICD-10-CM | POA: Diagnosis not present

## 2019-02-06 DIAGNOSIS — Z79899 Other long term (current) drug therapy: Secondary | ICD-10-CM | POA: Insufficient documentation

## 2019-02-06 DIAGNOSIS — Z823 Family history of stroke: Secondary | ICD-10-CM | POA: Insufficient documentation

## 2019-02-06 DIAGNOSIS — I38 Endocarditis, valve unspecified: Secondary | ICD-10-CM | POA: Diagnosis not present

## 2019-02-06 DIAGNOSIS — I712 Thoracic aortic aneurysm, without rupture: Secondary | ICD-10-CM | POA: Diagnosis not present

## 2019-02-06 DIAGNOSIS — F17210 Nicotine dependence, cigarettes, uncomplicated: Secondary | ICD-10-CM | POA: Diagnosis not present

## 2019-02-06 DIAGNOSIS — Z803 Family history of malignant neoplasm of breast: Secondary | ICD-10-CM | POA: Diagnosis not present

## 2019-02-06 DIAGNOSIS — E785 Hyperlipidemia, unspecified: Secondary | ICD-10-CM | POA: Diagnosis not present

## 2019-02-06 DIAGNOSIS — I2510 Atherosclerotic heart disease of native coronary artery without angina pectoris: Secondary | ICD-10-CM | POA: Diagnosis not present

## 2019-02-06 DIAGNOSIS — Z7901 Long term (current) use of anticoagulants: Secondary | ICD-10-CM | POA: Insufficient documentation

## 2019-02-06 DIAGNOSIS — Z952 Presence of prosthetic heart valve: Secondary | ICD-10-CM | POA: Diagnosis not present

## 2019-02-06 DIAGNOSIS — Z833 Family history of diabetes mellitus: Secondary | ICD-10-CM | POA: Insufficient documentation

## 2019-02-06 DIAGNOSIS — I5022 Chronic systolic (congestive) heart failure: Secondary | ICD-10-CM | POA: Diagnosis not present

## 2019-02-06 DIAGNOSIS — Z5321 Procedure and treatment not carried out due to patient leaving prior to being seen by health care provider: Secondary | ICD-10-CM

## 2019-02-06 LAB — BASIC METABOLIC PANEL
Anion gap: 12 (ref 5–15)
BUN: 27 mg/dL — ABNORMAL HIGH (ref 8–23)
CO2: 22 mmol/L (ref 22–32)
Calcium: 9.5 mg/dL (ref 8.9–10.3)
Chloride: 101 mmol/L (ref 98–111)
Creatinine, Ser: 1.26 mg/dL — ABNORMAL HIGH (ref 0.61–1.24)
GFR calc Af Amer: >60 mL/min (ref >60)
GFR calc non Af Amer: 60 mL/min — ABNORMAL LOW (ref >60)
Glucose, Bld: 111 mg/dL — ABNORMAL HIGH (ref 70–99)
Potassium: 4.4 mmol/L (ref 3.5–5.1)
Sodium: 135 mmol/L (ref 135–145)

## 2019-02-06 LAB — BRAIN NATRIURETIC PEPTIDE: B Natriuretic Peptide: 137.4 pg/mL — ABNORMAL HIGH (ref 0.0–100.0)

## 2019-02-06 MED ORDER — SPIRONOLACTONE 25 MG PO TABS: 25.0000 mg | ORAL_TABLET | Freq: Every day | ORAL | 3 refills | Status: DC

## 2019-02-06 MED ORDER — FUROSEMIDE 40 MG PO TABS: ORAL_TABLET | ORAL | 3 refills | Status: DC

## 2019-02-06 MED ORDER — POTASSIUM CHLORIDE CRYS ER 20 MEQ PO TBCR: 20.0000 meq | EXTENDED_RELEASE_TABLET | Freq: Every day | ORAL | 3 refills | Status: DC

## 2019-02-06 MED ORDER — ENOXAPARIN SODIUM 150 MG/ML ~~LOC~~ SOLN: 120.0000 mg | Freq: Two times a day (BID) | SUBCUTANEOUS | 0 refills | Status: DC

## 2019-02-06 NOTE — Telephone Encounter (Signed)
Routing to [employer]

## 2019-02-06 NOTE — Telephone Encounter (Signed)
I agree. Patient missed his appointment this afternoon. Please get scheduled ASAP.

## 2019-02-06 NOTE — Progress Notes (Signed)
Patient Name:         DOB:       Height:     Weight:  Office Name:         Referring Provider:  Today's Date:  Date:   STOP BANG RISK ASSESSMENT S (snore) Have you been told that you snore?     YES   T (tired) Are you often tired, fatigued, or sleepy during the day?   YES  O (obstruction) Do you stop breathing, choke, or gasp during sleep? NO   P (pressure) Do you have or are you being treated for high blood pressure? YES   B (BMI) Is your body index greater than 35 kg/m? YES   A (age) Are you 64 years old or older? YES   N (neck) Do you have a neck circumference greater than 16 inches?   YES   G (gender) Are you a male? YES   TOTAL STOP/BANG "YES" ANSWERS                                                                        For Office Use Only              Procedure Order Form    YES to 3+ Stop Bang questions OR two clinical symptoms - patient qualifies for WatchPAT (CPT 95800)     Submit: This Form + Patient Face Sheet + Clinical Note via Cloud[employer] or Fax: [phone]         Clinical Notes: Will consult Sleep Specialist and refer for management of therapy due to patient increased risk of Sleep Apnea. Ordering a sleep study due to the following two clinical symptoms: Excessive daytime sleepiness G47.10  Loud snoring R06.83 / story of high blood pressure R03.0    I understand that I am proceeding with a home sleep apnea test as ordered by my treating physician. I understand that untreated sleep apnea is a serious cardiovascular risk factor and it is my responsibility to perform the test and seek management for sleep apnea. I will be contacted with the results and be managed for sleep apnea by a local sleep physician. I will be receiving equipment and further instructions from [facility]. I shall promptly ship back the equipment via the included mailing label. I understand my insurance will be billed for the test and as the patient I am responsible for any insurance  related out-of-pocket costs incurred. I have been provided with written instructions and can call for additional video or telephonic instruction, with 24-hour availability of qualified personnel to answer any questions: Patient Help Desk [phone].   Patient Telemedicine Verbal Consent

## 2019-02-06 NOTE — Telephone Encounter (Signed)
Pt called stating his INR was checked this morning and it was 1.5. Pt is requesting the lovenox injection be sent in for him. Please advise.   [facility] [address] [place] [address]  Phone: [phone] Fax: [phone]  Not a 24 hour pharmacy; exact hours not known.

## 2019-02-06 NOTE — Progress Notes (Signed)
Called patient 3x a prearranged time. Patient did not answer the calls. LMOM for him to call back. Patient not seen today.

## 2019-02-06 NOTE — Telephone Encounter (Signed)
Was to have virtual appointment today- did not answer the phone.

## 2019-02-06 NOTE — Patient Instructions (Signed)
Lab work done today. We will notify you of any abnormal lab work. No news is good news.  You will need lab work done again in 10 days. Please contact [facility] to make an appointment to have it drawn. They currently have the order.  EKG done today.  Your provider has recommended that you have a home sleep study.  [employer] is the company that provides these and will send the equipment right to your home with instructions on how to set it up.  Once you have completed the test you just dispose of the equipment, the information is automatically uploaded to [place].  IF you have any questions or issues with the equipment please call the company.  If your test was positive and you need a home CPAP machine you will be contacted by Dr Theodosia Blender office [facility]) to set this up.  INCREASE Furosemide to 80mg  (2 tabs) every morning and 40mg  (1 tab) every evening.  INCREASE Spironolactone to 25mg  daily. (one tab daily)  START Potassium 20 meq daily  Please follow up with the [facility] in 2 weeks.  At the [facility], you and your health needs are our priority. As part of our continuing mission to provide you with exceptional heart care, we have created designated Provider Care Teams. These Care Teams include your primary Cardiologist (physician) and Advanced Practice Providers (APPs- Physician Assistants and Nurse Practitioners) who all work together to provide you with the care you need, when you need it.   You may see any of the following providers on your designated Care Team at your next follow up: Marland Kitchen Dr Glori Bickers . Dr Loralie Champagne . Darrick Grinder, NP   Please be sure to bring in all your medications bottles to every appointment.

## 2019-02-06 NOTE — Telephone Encounter (Signed)
Copied from [place] [id number]. Topic: General - Other >> Feb 06, 2019  2:41 PM Keene Breath wrote: Reason for CRM: Patient called to let the doctor know that his heart doctor feels he needs Lovenox and is going to issue a script.  Please advise and call patient back to discuss.  CB# [phone]

## 2019-02-07 ENCOUNTER — Encounter: Payer: Self-pay | Admitting: Nurse Practitioner

## 2019-02-07 ENCOUNTER — Ambulatory Visit (INDEPENDENT_AMBULATORY_CARE_PROVIDER_SITE_OTHER): Payer: Medicare Other | Admitting: Nurse Practitioner

## 2019-02-07 DIAGNOSIS — Z952 Presence of prosthetic heart valve: Secondary | ICD-10-CM

## 2019-02-07 NOTE — Patient Instructions (Signed)
Bleeding Precautions When on Anticoagulant Therapy, Adult °Anticoagulant therapy, also called blood thinner therapy, is medicine that helps to prevent and treat blood clots. The medicine works by stopping blood clots from forming or growing. Blood clots that form in your blood vessels can be dangerous. They can break loose and travel to the heart, lungs, or brain. This increases the risk of a heart attack, stroke, or blocked lung artery (pulmonary embolism). °Anticoagulants also increase the risk of bleeding. Try to protect yourself from cuts and other injuries that can cause bleeding. It is important to take anticoagulants exactly as told by your health care provider. °Why do I need to be on anticoagulant therapy? °You may need this medicine if you are at risk of developing a blood clot. Conditions that increase your risk of a blood clot include: °· Being born with heart disease or a heart malformation (congenital heart disease). °· Developing heart disease. °· Having had surgery, such as valve replacement. °· Having had a serious accident or other type of severe injury (trauma). °· Having certain types of cancer. °· Having certain diseases that can increase blood clotting. °· Having a high risk of stroke or heart attack. °· Having atrial fibrillation (AF). °What are the common anticoagulant medicines? °There are several types of anticoagulant medicines. The most common types are: °· Medicines that you take by mouth (oral medicines), such as: °? Warfarin. °? Novel oral anticoagulants (NOACs), such as: °? Direct thrombin inhibitors (dabigatran). °? Factor Xa inhibitors (apixaban, edoxaban, and rivaroxaban). °· Injections, such as: °? Unfractionated heparin. °? Low molecular weight heparin. °These anticoagulants work in different ways to prevent blood clots. They also have different risks and side effects. °What do I need to remember while on anticoagulant therapy? °Taking anticoagulants °· Take your medicine at the  same time every day. If you forget to take your medicine, take it as soon as you remember. Do not double your dosage of medicine if you miss a whole day. Take your normal dose and call your health care provider. °· Do not stop taking your medicine unless your health care provider approves. Stopping the medicine can increase your risk of developing a blood clot. °Taking other medicines °· Take over-the-counter and prescriptions medicines only as told by your health care provider. °· Do not take over-the-counter NSAIDs, including aspirin and ibuprofen, while you are on anticoagulant therapy. These medicines increase your risk of dangerous bleeding. °· Get approval from your health care provider before you start taking any new medicines, vitamins, or herbal products. Some of these could interfere with your therapy. °General instructions °· Keep all follow-up visits as told by your health care provider. This is important. °· If you are pregnant or trying to get pregnant, talk with a health care provider about anticoagulants. Some of these medicines are not safe to take during pregnancy. °· Tell all health care providers, including your dentist, that you are on anticoagulant therapy. It is especially important to tell providers before you have any surgery, medical procedures, or dental work done. °What precautions should I take? ° °· Be very careful when using knives, scissors, or other sharp objects. °· Use an electric razor instead of a blade. °· Do not use toothpicks. °· Use a soft-bristled toothbrush. Brush your teeth gently. °· Always wear shoes outdoors and wear slippers indoors. °· Be careful when cutting your fingernails and toenails. °· Place bath mats in the bathroom. If possible, install handrails as well. °· Wear gloves while you do   yard work. °· Wear your seat belt. °· Prevent falls by removing loose rugs and extension cords from areas where you walk. Use a cane or walker if you need it. °· Avoid  constipation by: °? Drinking enough fluid to keep your urine clear or pale yellow. °? Eating foods that are high in fiber, such as fresh fruits and vegetables, whole grains, and beans. °? Limiting foods that are high in fat and processed sugars, such as fried and sweet foods. °· Do not play contact sports or participate in other activities that have a high risk for injury. °What other precautions are important if on warfarin therapy? °If you are taking a type of anticoagulant called warfarin, make sure you: °· Work with a diet and nutrition specialist (dietitian) to make an eating plan. Do not make any sudden changes to your diet after you have started your eating plan. °· Do not drink alcohol. It can interfere with your medicine and increase your risk of an injury that causes bleeding. °· Get regular blood tests as told by your health care provider. °What are some questions to ask my health care provider? °· Why do I need anticoagulant therapy? °· What is the best anticoagulant therapy for my condition? °· How long will I need anticoagulant therapy? °· What are the side effects of anticoagulant therapy? °· When should I take my medicine? What should I do if I forget to take it? °· Will I need to have regular blood tests? °· Do I need to change my diet? Are there foods or drinks that I should avoid? °· What activities are safe for me? °· What should I do if I want to get pregnant? °Contact a health care provider if: °· You miss a dose of medicine: °? And you are not sure what to do. °? For more than one day. °· You have: °? Menstrual bleeding that is heavier than normal. °? Bloody or brown urine. °? Easy bruising. °? Black and tarry stool or bright red stool. °? Side effects from your medicine. °· You feel weak or dizzy. °· You become pregnant. °Get help right away if: °· You have bleeding that will not stop within 20 minutes from: °? The nose. °? The gums. °? A cut on the skin. °· You have a severe headache or  stomachache. °· You vomit or cough up blood. °· You fall or hit your head. °Summary °· Anticoagulant therapy, also called blood thinner therapy, is medicine that helps to prevent and treat blood clots. °· Anticoagulants work in different ways to prevent blood clots. They also have different risks and side effects. °· Talk with your health care provider about any precautions that you should take while on anticoagulant therapy. °This information is not intended to replace advice given to you by your health care provider. Make sure you discuss any questions you have with your health care provider. °Document Released: 05/31/2015 Document Revised: 10/09/2018 Document Reviewed: 09/05/2016 °Elsevier Patient Education © 2020 [employer] ° °

## 2019-02-07 NOTE — Assessment & Plan Note (Signed)
INR 1.6, will change Coumadin to 8 MG, 4.5 MG, 4.5 MG and repeat + continue Lovenox bridge.  Goal to get to goal INR and then d/c Lovenox.  Recheck INR in 3 days.

## 2019-02-07 NOTE — Progress Notes (Signed)
There were no vitals taken for this visit.   Subjective:    Patient ID: Roberto [name], male    DOB: 1955-11-22, 63 y.o.   MRN: [id number]    This visit was completed via telephone due to the restrictions of the COVID-19 pandemic. All issues as above were discussed and addressed but no physical exam was performed. If it was felt that the patient should be evaluated in the office, they were directed there. The patient verbally consented to this visit. Patient was unable to complete an audio/visual visit due to Lack of equipment. Due to the catastrophic nature of the COVID-19 pandemic, this visit was done through audio contact only.  Location of the patient: home  Location of the provider: home  Those involved with this call:   Provider: Marnee Guarneri, DNP  CMA: Yvonna Alanis, [place] Desk/Registration: Jill Side   Time spent on call: 15 minutes on the phone discussing health concerns. 10 minutes total spent in review of patient's record and preparation of their chart.   I verified patient identity using two factors (patient name and date of birth). Patient consents verbally to being seen via telemedicine visit today.    CC: Coumadin management  HPI: This patient is a 63 y.o. male who presents for coumadin management. The expected duration of coumadin treatment is lifelong The reason for anticoagulation is  mechanical heart valve. Current INR 1.6.  Present Coumadin dose: Lovenox (has been taking since 01/28/19) and Warfarin 7.5, 4, 4, repeat --- goal is to get patient to adequate level INR and then discontinue Lovenox (bridging) Goal: 2.5-3.5 The patient does not have an INR goal. Excessive bruising: no Nose bleeding: no Rectal bleeding: no Prolonged menstrual cycles: N/A Eating diet with consistent amounts of foods containing Vitamin K:yes Any recent antibiotic use? no  Relevant past medical, surgical, family and social history reviewed and updated as indicated.  Interim medical history since our last visit reviewed. Allergies and medications reviewed and updated.  ROS: Per HPI unless specifically indicated above     Objective:    There were no vitals taken for this visit.  Wt Readings from Last 3 Encounters:  02/06/19 (!) 348 lb (157.9 kg)  01/28/19 (!) 354 lb 0.9 oz (160.6 kg)  01/27/19 (!) 354 lb (160.6 kg)     General: Unable to assess due to telephone visit only.  Last INR: 1.1    Last CBC:  Lab Results  Component Value Date   WBC 7.3 01/24/2019   HGB 14.3 01/24/2019   HCT 43.7 01/24/2019   MCV 91.0 01/24/2019   PLT 202 01/24/2019    Results for orders placed or performed during the hospital encounter of 36/14/43  Basic Metabolic Panel (BMET)  Result Value Ref Range   Sodium 135 135 - 145 mmol/L   Potassium 4.4 3.5 - 5.1 mmol/L   Chloride 101 98 - 111 mmol/L   CO2 22 22 - 32 mmol/L   Glucose, Bld 111 (H) 70 - 99 mg/dL   BUN 27 (H) 8 - 23 mg/dL   Creatinine, Ser 1.26 (H) 0.61 - 1.24 mg/dL   Calcium 9.5 8.9 - 10.3 mg/dL   GFR calc non Af Amer 60 (L) >60 mL/min   GFR calc Af Amer >60 >60 mL/min   Anion gap 12 5 - 15  B Nat Peptide  Result Value Ref Range   B Natriuretic Peptide 137.4 (H) 0.0 - 100.0 pg/mL       Assessment:  ICD-10-CM   1. H/O mechanical aortic valve replacement  Z95.2     Plan:   Discussed current plan face-to-face with patient. For coumadin dosing, elected to change dose to 8 MG, 4.5 MG. 4.5 MG, repeat. Will plan to recheck INR in 3 days.  I discussed the assessment and treatment plan with the patient. The patient was provided an opportunity to ask questions and all were answered. The patient agreed with the plan and demonstrated an understanding of the instructions.   The patient was advised to call back or seek an in-person evaluation if the symptoms worsen or if the condition fails to improve as anticipated.   I provided 15 minutes of time during this encounter.

## 2019-02-09 NOTE — Progress Notes (Signed)
PCP: Valerie Roys, DO HF Cardiology: Dr. Aundra Dubin  63 yo with history of bicuspid aortic valve disorder s/p mechanical AVR in 9/11 and 5.5 cm ascending aortic aneurysm on most recent CT in 9/02, chronic systolic CHF, and smoking was referred by Dr. Orvan Seen for optimization of CHF prior to ascending aorta replacement surgery.  Most recent echo in 7/20 showed EF 40-45% with stable mechanical aortic valve.  RHC/LHC in 7/20 showed nonobstructive CAD and R>L sided failure.    Patient has had exertional dyspnea if he tries to walk fast for the months now.  Over the last 6-8 months, he thinks that he has gained about 50 lbs.  He is short of breath walking up stairs.  Able to do ADLs like dressing/showering without much problem.  He has bendopnea.  No orthopnea/PND.  No chest pain. He snores and stops breathing at night per his wife and has daytime sleepiness.  He still smokes 1/2 ppd.   Labs (6/20): LDL 94 Labs (7/20): K 4.4, creatinine 1.13  ECG: Sinus tachy rate 106, PACs  PMH: 1. Bicuspid aortic valve: s/p mechanical AVR 9/11 2. H/o CVA 3. Hyperlipidemia 4. H/o renal infarct 5. Active smoker: Could not tolerate Chantix 6. Chronic systolic CHF: Nonischemic cardiomyopathy.  - Echo (7/20): EF 40-45%, normal RV, mechanical aortic valve with mean gradient 15 mmHg - LHC/RHC (7/20): 60% stenosis D1; mean RA 22, RV 45/26 mean 36, mean PCWP 23, CI 2.4 7. Ascending aortic aneurysm: Suspect related to bicuspid aortic valve.  - CTA (7/20) with 5.5 cm ascending aorta.   Social History   Socioeconomic History  . Marital status: Single    Spouse name: Not on file  . Number of children: 1  . Years of education: 4  . Highest education level: Associate degree: academic program  Occupational History  . Occupation: Disabled    Fish farm manager: UNEMPLOYED  Social Needs  . Financial resource strain: Not very hard  . Food insecurity    Worry: Never true    Inability: Never true  . Transportation needs     Medical: No    Non-medical: No  Tobacco Use  . Smoking status: Current Every Day Smoker    Packs/day: 0.50    Years: 46.00    Pack years: 23.00    Types: Cigarettes    Start date: 07/16/2017  . Smokeless tobacco: Never Used  . Tobacco comment: 10 cigarettes a day  Substance and Sexual Activity  . Alcohol use: No    Alcohol/week: 0.0 standard drinks  . Drug use: No  . Sexual activity: Not Currently  Lifestyle  . Physical activity    Days per week: 0 days    Minutes per session: 0 min  . Stress: Not at all  Relationships  . Social Herbalist on phone: Never    Gets together: Never    Attends religious service: Never    Active member of club or organization: No    Attends meetings of clubs or organizations: Never    Relationship status: Married  . Intimate partner violence    Fear of current or ex partner: No    Emotionally abused: No    Physically abused: No    Forced sexual activity: No  Other Topics Concern  . Not on file  Social History Narrative  . Not on file   Family History  Problem Relation Age of Onset  . Arthritis Mother   . Dementia Mother   . Colon cancer  Mother   . Arthritis Father   . Diabetes Father   . Stroke Father   . Colon cancer Father   . Heart attack Brother   . Breast cancer Sister   . Seizures Sister   . Cancer Brother        brain  . Heart disease Brother   . Heart attack Brother    ROS: All systems reviewed and negative except as per HPI.  Current Outpatient Medications  Medication Sig Dispense Refill  . albuterol (PROVENTIL HFA;VENTOLIN HFA) 108 (90 Base) MCG/ACT inhaler Inhale 2 puffs into the lungs every 6 (six) hours as needed for wheezing or shortness of breath. 3 Inhaler 3  . albuterol (PROVENTIL) (2.5 MG/3ML) 0.083% nebulizer solution Take 3 mLs (2.5 mg total) by nebulization every 6 (six) hours as needed for wheezing or shortness of breath. 150 mL 1  . aspirin 81 MG chewable tablet Chew 81 mg by mouth daily.       . cyclobenzaprine (FLEXERIL) 10 MG tablet Take 1 tablet (10 mg total) by mouth 3 times/day as needed-between meals & bedtime for muscle spasms. 180 tablet 0  . enoxaparin (LOVENOX) 150 MG/ML injection Inject 0.8 mLs (120 mg total) into the skin every 12 (twelve) hours for 5 days. 8 mL 0  . Fluticasone-Salmeterol (ADVAIR) 250-50 MCG/DOSE AEPB Inhale 1 puff into the lungs 2 (two) times daily as needed (asthma).     . furosemide (LASIX) 40 MG tablet Take 1 tablet (40 mg total) by mouth every evening AND 2 tablets (80 mg total) every morning. 270 tablet 3  . lisinopril (PRINIVIL,ZESTRIL) 5 MG tablet Take 1 tablet (5 mg total) by mouth daily. 90 tablet 2  . metoprolol succinate (TOPROL-XL) 100 MG 24 hr tablet Take 100 mg by mouth daily. Take with or immediately following a meal.    . nortriptyline (PAMELOR) 10 MG capsule Take 10 mg by mouth at bedtime.    . simvastatin (ZOCOR) 40 MG tablet Take 1 tablet (40 mg total) by mouth at bedtime. 90 tablet 1  . spironolactone (ALDACTONE) 25 MG tablet Take 1 tablet (25 mg total) by mouth daily. 90 tablet 3  . tiotropium (SPIRIVA) 18 MCG inhalation capsule Place 18 mcg into inhaler and inhale daily as needed (asthma).     . warfarin (COUMADIN) 4 MG tablet 4 mg for 2 days, 7.5 mg on the 3rd day, and repeat    . warfarin (COUMADIN) 7.5 MG tablet Take 7.5 mg by mouth See admin instructions. 4 mg for 2 days, 7.5 mg on the 3rd day, and repeat    . potassium chloride SA (K-DUR) 20 MEQ tablet Take 1 tablet (20 mEq total) by mouth daily. 90 tablet 3   No current facility-administered medications for this encounter.    BP 103/76   Pulse 99   Wt (!) 157.9 kg (348 lb)   SpO2 97%   BMI 56.17 kg/m  General: NAD Neck: Thick, JVP 8-9 cm, no thyromegaly or thyroid nodule.  Lungs: Distant BS CV: Nondisplaced PMI.  Heart regular S1/S2 with mechanical S2, no S3/S4, 1/6 SEM RUSB.  1+ chronic edema to knees.  No carotid bruit.  Normal pedal pulses.  Abdomen: Soft, nontender,  no hepatosplenomegaly, no distention.  Skin: Intact without lesions or rashes.  Neurologic: Alert and oriented x 3.  Psych: Normal affect. Extremities: No clubbing or cyanosis.  HEENT: Normal.   Assessment/Plan: 1. Chronic systolic CHF: Most recent echo with EF 40-45% in 7/20.  RHC in  7/20 showed Right > left-sided CHF.  I suspect that severe sleep apnea plays a role in right-sided HF.  Coronary angiography in 7/20 showed nonobstructive CAD.  On exam today, he is volume overloaded.   - Increase Lasix to 80 qam/40 qpm, add KCl 20 daily.  BMET/BNP today and repeat BMET in 10 days.  - Increase spironolactone to 25 mg daily.  - Continue current lisinopril and Toprol XL.  2. OSA: Strongly suspect OSA.   - I will arrange for home sleep study.  3. CAD: Nonobstructive.  I would aim for LDL < 70.  - Continue ASA 81.  - LDL recently was 94, he is working with Christell Faith regarding lipid management (needs intensified statin).  4. Mechanical aortic valve: Stable function on 7/20 echo.  Given history of CVA, needs Lovenox bridging whenever INR subtherapeutic.  He is on ASA 81 also.  5. Smoking: I strongly encouraged him to quit.  He did not tolerate Chantix.  He is going to consider nicotine patches.  6. Ascending aortic aneurysm: Suspect this is related to his bicuspid aortic valve.  Last CTA in 7/20 showed aneurysm 5.5 cm.   - He is seeing Dr. Orvan Seen, will need ascending aorta replacement when CHF is managed.   Followup with my NP in 2 wks.   Loralie Champagne 02/09/2019

## 2019-02-10 ENCOUNTER — Ambulatory Visit: Payer: Medicare Other | Admitting: Nurse Practitioner

## 2019-02-13 ENCOUNTER — Encounter: Payer: Self-pay | Admitting: Family Medicine

## 2019-02-13 LAB — POCT INR: INR: 2.5 (ref 2.0–3.0)

## 2019-02-17 ENCOUNTER — Other Ambulatory Visit
Admission: RE | Admit: 2019-02-17 | Discharge: 2019-02-17 | Disposition: A | Discharge status: Home / Self Care | Payer: Medicare Other | Source: Ambulatory Visit | Attending: Cardiology | Admitting: Cardiology

## 2019-02-17 ENCOUNTER — Other Ambulatory Visit: Payer: Self-pay

## 2019-02-17 DIAGNOSIS — I5022 Chronic systolic (congestive) heart failure: Secondary | ICD-10-CM | POA: Diagnosis not present

## 2019-02-17 LAB — BASIC METABOLIC PANEL
Anion gap: 9 (ref 5–15)
BUN: 33 mg/dL — ABNORMAL HIGH (ref 8–23)
CO2: 26 mmol/L (ref 22–32)
Calcium: 9.6 mg/dL (ref 8.9–10.3)
Chloride: 101 mmol/L (ref 98–111)
Creatinine, Ser: 1.41 mg/dL — ABNORMAL HIGH (ref 0.61–1.24)
GFR calc Af Amer: >60 mL/min (ref >60)
GFR calc non Af Amer: 52 mL/min — ABNORMAL LOW (ref >60)
Glucose, Bld: 127 mg/dL — ABNORMAL HIGH (ref 70–99)
Potassium: 4.7 mmol/L (ref 3.5–5.1)
Sodium: 136 mmol/L (ref 135–145)

## 2019-02-18 ENCOUNTER — Telehealth (HOSPITAL_COMMUNITY): Payer: Self-pay

## 2019-02-18 NOTE — Telephone Encounter (Signed)
-----   Message from Larey Dresser, MD sent at 02/17/2019  9:35 PM EDT ----- Hold Lasix for 1 day, then decrease to Lasix 80 mg daily.

## 2019-02-18 NOTE — Telephone Encounter (Signed)
Left detailed message on VM regarding med changes, has f/u appt in 2 days with APP.

## 2019-02-20 ENCOUNTER — Encounter (HOSPITAL_COMMUNITY): Payer: Self-pay

## 2019-02-20 ENCOUNTER — Other Ambulatory Visit: Payer: Self-pay

## 2019-02-20 ENCOUNTER — Ambulatory Visit (HOSPITAL_COMMUNITY)
Admission: RE | Admit: 2019-02-20 | Discharge: 2019-02-20 | Disposition: A | Discharge status: Home / Self Care | Payer: Medicare Other | Source: Ambulatory Visit | Attending: Internal Medicine | Admitting: Internal Medicine

## 2019-02-20 VITALS — BP 96/64 | HR 96 | Wt 349.2 lb

## 2019-02-20 DIAGNOSIS — Z823 Family history of stroke: Secondary | ICD-10-CM | POA: Insufficient documentation

## 2019-02-20 DIAGNOSIS — I2510 Atherosclerotic heart disease of native coronary artery without angina pectoris: Secondary | ICD-10-CM | POA: Insufficient documentation

## 2019-02-20 DIAGNOSIS — Z8249 Family history of ischemic heart disease and other diseases of the circulatory system: Secondary | ICD-10-CM | POA: Diagnosis not present

## 2019-02-20 DIAGNOSIS — Q231 Congenital insufficiency of aortic valve: Secondary | ICD-10-CM | POA: Insufficient documentation

## 2019-02-20 DIAGNOSIS — Z8261 Family history of arthritis: Secondary | ICD-10-CM | POA: Diagnosis not present

## 2019-02-20 DIAGNOSIS — E669 Obesity, unspecified: Secondary | ICD-10-CM | POA: Diagnosis not present

## 2019-02-20 DIAGNOSIS — F17210 Nicotine dependence, cigarettes, uncomplicated: Secondary | ICD-10-CM | POA: Insufficient documentation

## 2019-02-20 DIAGNOSIS — Z808 Family history of malignant neoplasm of other organs or systems: Secondary | ICD-10-CM | POA: Insufficient documentation

## 2019-02-20 DIAGNOSIS — R5383 Other fatigue: Secondary | ICD-10-CM | POA: Diagnosis not present

## 2019-02-20 DIAGNOSIS — I712 Thoracic aortic aneurysm, without rupture: Secondary | ICD-10-CM | POA: Insufficient documentation

## 2019-02-20 DIAGNOSIS — I428 Other cardiomyopathies: Secondary | ICD-10-CM | POA: Diagnosis not present

## 2019-02-20 DIAGNOSIS — R0602 Shortness of breath: Secondary | ICD-10-CM

## 2019-02-20 DIAGNOSIS — R29818 Other symptoms and signs involving the nervous system: Secondary | ICD-10-CM

## 2019-02-20 DIAGNOSIS — Z7982 Long term (current) use of aspirin: Secondary | ICD-10-CM | POA: Diagnosis not present

## 2019-02-20 DIAGNOSIS — Z6841 Body Mass Index (BMI) 40.0 and over, adult: Secondary | ICD-10-CM | POA: Diagnosis not present

## 2019-02-20 DIAGNOSIS — Z833 Family history of diabetes mellitus: Secondary | ICD-10-CM | POA: Diagnosis not present

## 2019-02-20 DIAGNOSIS — Z800 Family history of malignant neoplasm of digestive organs: Secondary | ICD-10-CM | POA: Diagnosis not present

## 2019-02-20 DIAGNOSIS — Z8673 Personal history of transient ischemic attack (TIA), and cerebral infarction without residual deficits: Secondary | ICD-10-CM | POA: Diagnosis not present

## 2019-02-20 DIAGNOSIS — Z79899 Other long term (current) drug therapy: Secondary | ICD-10-CM | POA: Insufficient documentation

## 2019-02-20 DIAGNOSIS — E785 Hyperlipidemia, unspecified: Secondary | ICD-10-CM | POA: Insufficient documentation

## 2019-02-20 DIAGNOSIS — Z7901 Long term (current) use of anticoagulants: Secondary | ICD-10-CM | POA: Diagnosis not present

## 2019-02-20 DIAGNOSIS — Z720 Tobacco use: Secondary | ICD-10-CM | POA: Diagnosis not present

## 2019-02-20 DIAGNOSIS — I5022 Chronic systolic (congestive) heart failure: Secondary | ICD-10-CM | POA: Diagnosis not present

## 2019-02-20 DIAGNOSIS — Z952 Presence of prosthetic heart valve: Secondary | ICD-10-CM | POA: Diagnosis not present

## 2019-02-20 DIAGNOSIS — Z803 Family history of malignant neoplasm of breast: Secondary | ICD-10-CM | POA: Diagnosis not present

## 2019-02-20 LAB — BASIC METABOLIC PANEL
Anion gap: 10 (ref 5–15)
BUN: 18 mg/dL (ref 8–23)
CO2: 23 mmol/L (ref 22–32)
Calcium: 8.8 mg/dL — ABNORMAL LOW (ref 8.9–10.3)
Chloride: 106 mmol/L (ref 98–111)
Creatinine, Ser: 1.18 mg/dL (ref 0.61–1.24)
GFR calc Af Amer: >60 mL/min (ref >60)
GFR calc non Af Amer: >60 mL/min (ref >60)
Glucose, Bld: 136 mg/dL — ABNORMAL HIGH (ref 70–99)
Potassium: 4.2 mmol/L (ref 3.5–5.1)
Sodium: 139 mmol/L (ref 135–145)

## 2019-02-20 LAB — CBC
HCT: 43.9 % (ref 39.0–52.0)
Hemoglobin: 14.1 g/dL (ref 13.0–17.0)
MCH: 29.8 pg (ref 26.0–34.0)
MCHC: 32.1 g/dL (ref 30.0–36.0)
MCV: 92.8 fL (ref 80.0–100.0)
Platelets: 243 10*3/uL (ref 150–400)
RBC: 4.73 MIL/uL (ref 4.22–5.81)
RDW: 13.5 % (ref 11.5–15.5)
WBC: 8.2 10*3/uL (ref 4.0–10.5)
nRBC: 0 % (ref 0.0–0.2)

## 2019-02-20 MED ORDER — NICOTINE 7 MG/24HR TD PT24: 7.0000 mg | MEDICATED_PATCH | TRANSDERMAL | 0 refills | Status: DC

## 2019-02-20 NOTE — Progress Notes (Signed)
PCP: Valerie Roys, DO HF Cardiology: Dr. Aundra Dubin  63 yo with history of bicuspid aortic valve disorder s/p mechanical AVR in 9/11 and 5.5 cm ascending aortic aneurysm on most recent CT in 1/32, chronic systolic CHF, and smoking was referred by Dr. Orvan Seen for optimization of CHF prior to ascending aorta replacement surgery.  Most recent echo in 7/20 showed EF 40-45% with stable mechanical aortic valve.  RHC/LHC in 7/20 showed nonobstructive CAD and R>L sided failure.    Today he returns for HF follow up.Overall feeling fine. Yesterday lasix was held due to elevated creatinine. He remains SOB with exertion. Denies PND/Orthopnea.No chest pain.  No BRBPR. Appetite ok and has been lots of fast food with high sodium. No fever or chills. He does not weigh at home because he doesn't have a scale. Taking all medications. Smoking 1/2 pack -1 PPD. He has home sleep study but has not completed.   Labs (6/20): LDL 94 Labs (7/20): K 4.4, creatinine 1.13 Labs (02/17/19): Creatinine 1.4   ECG: Sinus tachy rate 106, PACs  PMH: 1. Bicuspid aortic valve: s/p mechanical AVR 9/11 2. H/o CVA 3. Hyperlipidemia 4. H/o renal infarct 5. Active smoker: Could not tolerate Chantix 6. Chronic systolic CHF: Nonischemic cardiomyopathy.  - Echo (7/20): EF 40-45%, normal RV, mechanical aortic valve with mean gradient 15 mmHg - LHC/RHC (7/20): 60% stenosis D1; mean RA 22, RV 45/26 mean 36, mean PCWP 23, CI 2.4 7. Ascending aortic aneurysm: Suspect related to bicuspid aortic valve.  - CTA (7/20) with 5.5 cm ascending aorta.   Social History   Socioeconomic History  . Marital status: Single    Spouse name: Not on file  . Number of children: 1  . Years of education: 4  . Highest education level: Associate degree: academic program  Occupational History  . Occupation: Disabled    Fish farm manager: UNEMPLOYED  Social Needs  . Financial resource strain: Not very hard  . Food insecurity    Worry: Never true    Inability:  Never true  . Transportation needs    Medical: No    Non-medical: No  Tobacco Use  . Smoking status: Current Every Day Smoker    Packs/day: 0.50    Years: 46.00    Pack years: 23.00    Types: Cigarettes    Start date: 07/16/2017  . Smokeless tobacco: Never Used  . Tobacco comment: 10 cigarettes a day  Substance and Sexual Activity  . Alcohol use: No    Alcohol/week: 0.0 standard drinks  . Drug use: No  . Sexual activity: Not Currently  Lifestyle  . Physical activity    Days per week: 0 days    Minutes per session: 0 min  . Stress: Not at all  Relationships  . Social Herbalist on phone: Never    Gets together: Never    Attends religious service: Never    Active member of club or organization: No    Attends meetings of clubs or organizations: Never    Relationship status: Married  . Intimate partner violence    Fear of current or ex partner: No    Emotionally abused: No    Physically abused: No    Forced sexual activity: No  Other Topics Concern  . Not on file  Social History Narrative  . Not on file   Family History  Problem Relation Age of Onset  . Arthritis Mother   . Dementia Mother   . Colon cancer Mother   .  Arthritis Father   . Diabetes Father   . Stroke Father   . Colon cancer Father   . Heart attack Brother   . Breast cancer Sister   . Seizures Sister   . Cancer Brother        brain  . Heart disease Brother   . Heart attack Brother    ROS: All systems reviewed and negative except as per HPI.  Current Outpatient Medications  Medication Sig Dispense Refill  . albuterol (PROVENTIL HFA;VENTOLIN HFA) 108 (90 Base) MCG/ACT inhaler Inhale 2 puffs into the lungs every 6 (six) hours as needed for wheezing or shortness of breath. 3 Inhaler 3  . albuterol (PROVENTIL) (2.5 MG/3ML) 0.083% nebulizer solution Take 3 mLs (2.5 mg total) by nebulization every 6 (six) hours as needed for wheezing or shortness of breath. 150 mL 1  . aspirin 81 MG chewable  tablet Chew 81 mg by mouth daily.      . cyclobenzaprine (FLEXERIL) 10 MG tablet Take 1 tablet (10 mg total) by mouth 3 times/day as needed-between meals & bedtime for muscle spasms. 180 tablet 0  . Fluticasone-Salmeterol (ADVAIR) 250-50 MCG/DOSE AEPB Inhale 1 puff into the lungs 2 (two) times daily as needed (asthma).     . furosemide (LASIX) 40 MG tablet Take 40 mg by mouth 2 (two) times daily.    Marland Kitchen lisinopril (PRINIVIL,ZESTRIL) 5 MG tablet Take 1 tablet (5 mg total) by mouth daily. 90 tablet 2  . metoprolol succinate (TOPROL-XL) 100 MG 24 hr tablet Take 100 mg by mouth daily. Take with or immediately following a meal.    . nortriptyline (PAMELOR) 10 MG capsule Take 10 mg by mouth at bedtime.    . potassium chloride SA (K-DUR) 20 MEQ tablet Take 1 tablet (20 mEq total) by mouth daily. 90 tablet 3  . simvastatin (ZOCOR) 40 MG tablet Take 1 tablet (40 mg total) by mouth at bedtime. 90 tablet 1  . spironolactone (ALDACTONE) 25 MG tablet Take 1 tablet (25 mg total) by mouth daily. 90 tablet 3  . tiotropium (SPIRIVA) 18 MCG inhalation capsule Place 18 mcg into inhaler and inhale daily as needed (asthma).     . warfarin (COUMADIN) 4 MG tablet 4 mg for 2 days, 7.5 mg on the 3rd day, and repeat    . warfarin (COUMADIN) 7.5 MG tablet Take 7.5 mg by mouth See admin instructions. 4 mg for 2 days, 7.5 mg on the 3rd day, and repeat     No current facility-administered medications for this encounter.    BP 96/64   Pulse 96   Wt (!) 158.4 kg (349 lb 3.2 oz)   SpO2 94%   BMI 56.36 kg/m   Wt Readings from Last 3 Encounters:  02/20/19 (!) 158.4 kg (349 lb 3.2 oz)  02/06/19 (!) 157.9 kg (348 lb)  01/28/19 (!) 160.6 kg (354 lb 0.9 oz)   General:  Appears chronically ill.  No resp difficulty HEENT: normal Neck: supple. no JVD. Carotids 2+ bilat; no bruits. No lymphadenopathy or thryomegaly appreciated. Cor: PMI nondisplaced. Regular rate & rhythm. No rubs, gallops or murmurs. Lungs: clear Abdomen:  obese, soft, nontender, nondistended. No hepatosplenomegaly. No bruits or masses. Good bowel sounds. Extremities: no cyanosis, clubbing, rash, edema Neuro: alert & orientedx3, cranial nerves grossly intact. moves all 4 extremities w/o difficulty. Affect pleasant    Assessment/Plan: 1. Chronic systolic CHF: Most recent echo with EF 40-45% in 7/20.  RHC in 7/20 showed Right > left-sided CHF.  Suspect that severe sleep apnea plays a role in right-sided HF.  Coronary angiography in 7/20 showed nonobstructive CAD.   NYHA III chronically.  - Volume status stable. Continue lasix 80 mg daily. Check BMET today.  - Continue spironolactone to 25 mg daily.  - Continue current lisinopril and Toprol XL.  2. OSA: Day time fatigue. Strongly suspect OSA.   He has home sleep study and plans to complete tonight.  3. CAD: Nonobstructive. Aim for LDL < 70.  - Continue ASA 81.  - LDL recently was 94, he is working with Christell Faith regarding lipid management (needs intensified statin).  4. Mechanical aortic valve: Stable function on 7/20 echo.  Given history of CVA, needs Lovenox bridging whenever INR subtherapeutic.  He is on ASA 81 also.  5. Smoking: He continue smoke 1/2-1 PPD. He is interested in nicoderm patch. Prescribed today.  6. Ascending aortic aneurysm: Suspect this is related to his bicuspid aortic valve.  Last CTA in 7/20 showed aneurysm 5.5 cm.   - Will need ascending aorta replacement when CHF is managed.  7. Obesity Body mass index is 56.36 kg/m. Discussed portion control.   Follow up in 4 weeks. Provide scale for daily weights. Discussed low salt food choices and limit fluid intake. Greater than 50% of the (total minutes 40*) visit spent in counseling/coordination of care regarding the above.     Amy Clegg NP-C  02/20/2019

## 2019-02-20 NOTE — Patient Instructions (Addendum)
Lab work done today. We will call you only if your labs are abnormal.  START Nicoderm Patches Place 1 patch (7 mg total) onto the skin daily.   Your physician recommends that you schedule a follow-up appointment in: 4 weeks with Dr. Aundra Dubin  Please continue to monitor your weight daily. A digital scale was provided to you. If you do notice a weight increase of more then 3 pounds in a please contact our office at [phone] to speak with the Triage CMA/NURSE  At the [facility], you and your health needs are our priority. As part of our continuing mission to provide you with exceptional heart care, we have created designated Provider Care Teams. These Care Teams include your primary Cardiologist (physician) and Advanced Practice Providers (APPs- Physician Assistants and Nurse Practitioners) who all work together to provide you with the care you need, when you need it.   You may see any of the following providers on your designated Care Team at your next follow up: Marland Kitchen Dr Glori Bickers . Dr Loralie Champagne . Darrick Grinder, NP   Please be sure to bring in all your medications bottles to every appointment.

## 2019-02-24 ENCOUNTER — Encounter: Payer: Self-pay | Admitting: Family Medicine

## 2019-02-24 LAB — POCT INR: INR: 1.9 — AB (ref 2.0–3.0)

## 2019-02-25 NOTE — Progress Notes (Signed)
Follow-up Outpatient Visit Date: 02/26/2019  Primary Care Provider: Valerie Roys, DO [place] [address]  Chief Complaint: Follow-up cardiomyopathy and thoracic aortic aneurysm  HPI:  Roberto [name] is a 63 y.o. year-old male with history of mechanical aortic valve replacement, non-obstructive coronary artery disease, thoracic aortic aneurysm stroke, right renal infarct, chronic systolic heart failure (LVEF 40-45%), hyperlipidemia, and morbid obesity, who presents for follow-up of heart failure and valvular heart diseaes.  He was last seen in our office in June by Christell Faith, PA.  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  He was subsequently seen by Dr. Orvan Seen for surgical repair.  Catheterization last month showed moderate single-vessel CAD with 60% D1 stenosis.  Hemodynamics showed evidence of biventricular failure (R>L), for which Roberto [name] was referred to the advanced HF clinic in order to optimize him in anticipation of aortic repair.  Today, Roberto [name] reports that he is feeling relatively well.  His exertional dyspnea is a little better than when we last met, though he still has limited functional capacity.  His leg edema has certainly improved with titration of furosemide by the heart failure clinic.  He denies chest pain, palpitations, and lightheadedness.  He is undergoing sleep evaluation for suspected sleep apnea.  He is also cutting down on smoking, currently using 4 cigarettes/day with the assistance of a nicotine patch.  He would like to begin low intensity exercise to help lose weight in anticipation of his aortic aneurysm repair.  --------------------------------------------------------------------------------------------------  Past Medical History:  Diagnosis Date   Bicuspid aortic valve    a. s/p #27 Carbomedics mechanical valve on 03/25/2010; b. on Coumadin; c. TTE 12/17: EF 40-45%, moderately dilated LV with moderate LVH, AVR  well-seated with 14 mmHg gradient, peak AV velocity 2.5 m/s, mild mitral valve thickening with mild MR, mildly dilated RV with mildly reduced contraction   Cellulitis    Chronic systolic CHF (congestive heart failure) ([facility])    a. R/LHC 03/2010 showed no significant CAD, LVEDP 31 mmHg, mean AoV gradient 34 mmHg at rest and 47 mmHg with dobutamine 20 mcg/kg/min, AVA 1.0 cm^2, RA 31, RV 68/25, PA 68/47, PCWP 38. PA sat 65%. CO 6.2 L/min ([facility]) and 5.3 L/min (thermodilution)   Clotting disorder (HCC)    H/O mechanical aortic valve replacement 03/25/2010   a. #27 Carbomedics mechanical valve   Hypercholesterolemia    Renal infarct [facility]) 2017   Multiple right renal infarcts, likely embolic.   Stroke [facility])    TIA (transient ischemic attack) 05/2014   Past Surgical History:  Procedure Laterality Date   AORTIC VALVE REPLACEMENT     CARDIAC CATHETERIZATION  03/21/2010   No significant CAD. Severe aortic stenosis. Severely elevated left and right heart filling pressures.   CARDIAC SURGERY  2009   CHF   CARPAL TUNNEL RELEASE Left 2005   RIGHT HEART CATH AND CORONARY ANGIOGRAPHY N/A 01/28/2019   Procedure: RIGHT HEART CATH AND CORONARY ANGIOGRAPHY;  Surgeon: Nelva Bush, MD;  Location: [facility];  Service: Cardiovascular;  Laterality: N/A;   TONSILLECTOMY  1962    Current Meds  Medication Sig   albuterol (PROVENTIL HFA;VENTOLIN HFA) 108 (90 Base) MCG/ACT inhaler Inhale 2 puffs into the lungs every 6 (six) hours as needed for wheezing or shortness of breath.   albuterol (PROVENTIL) (2.5 MG/3ML) 0.083% nebulizer solution Take 3 mLs (2.5 mg total) by nebulization every 6 (six) hours as needed for wheezing or  shortness of breath.   aspirin 81 MG chewable tablet Chew 81 mg by mouth daily.     cyclobenzaprine (FLEXERIL) 10 MG tablet Take 1 tablet (10 mg total) by mouth 3 times/day as needed-between meals & bedtime for muscle spasms.   Fluticasone-Salmeterol (ADVAIR)  250-50 MCG/DOSE AEPB Inhale 1 puff into the lungs 2 (two) times daily as needed (asthma).    furosemide (LASIX) 40 MG tablet Take 40 mg by mouth 2 (two) times daily.   lisinopril (PRINIVIL,ZESTRIL) 5 MG tablet Take 1 tablet (5 mg total) by mouth daily.   metoprolol succinate (TOPROL-XL) 100 MG 24 hr tablet Take 100 mg by mouth daily. Take with or immediately following a meal.   nicotine (NICODERM CQ - DOSED IN MG/24 HR) 7 mg/24hr patch Place 1 patch (7 mg total) onto the skin daily.   nortriptyline (PAMELOR) 10 MG capsule Take 10 mg by mouth at bedtime.   potassium chloride SA (K-DUR) 20 MEQ tablet Take 1 tablet (20 mEq total) by mouth daily.   simvastatin (ZOCOR) 40 MG tablet Take 1 tablet (40 mg total) by mouth at bedtime.   spironolactone (ALDACTONE) 25 MG tablet Take 1 tablet (25 mg total) by mouth daily.   tiotropium (SPIRIVA) 18 MCG inhalation capsule Place 18 mcg into inhaler and inhale daily as needed (asthma).    warfarin (COUMADIN) 4 MG tablet 4 mg for 2 days, 7.5 mg on the 3rd day, and repeat   warfarin (COUMADIN) 7.5 MG tablet Take 7.5 mg by mouth See admin instructions. 4 mg for 2 days, 7.5 mg on the 3rd day, and repeat    Allergies: Patient has no known allergies.  Social History   Tobacco Use   Smoking status: Current Every Day Smoker    Packs/day: 0.50    Years: 46.00    Pack years: 23.00    Types: Cigarettes    Start date: 07/16/2017   Smokeless tobacco: Never Used   Tobacco comment: 10 cigarettes a day  Substance Use Topics   Alcohol use: No    Alcohol/week: 0.0 standard drinks   Drug use: No    Family History  Problem Relation Age of Onset   Arthritis Mother    Dementia Mother    Colon cancer Mother    Arthritis Father    Diabetes Father    Stroke Father    Colon cancer Father    Heart attack Brother    Breast cancer Sister    Seizures Sister    Cancer Brother        brain   Heart disease Brother    Heart attack Brother       Review of Systems: A 12-system review of systems was performed and was negative except as noted in the HPI.  --------------------------------------------------------------------------------------------------  Physical Exam: BP 100/74 (BP Location: Left Arm, Patient Position: Sitting, Cuff Size: Large)    Pulse 91    Ht '5\' 6"'$  (1.676 m)    Wt (!) 341 lb (154.7 kg)    BMI 55.04 kg/m   General: NAD. HEENT: No conjunctival pallor or scleral icterus.  Facemask in place Neck: Supple without lymphadenopathy, thyromegaly, JVD, or HJR. Lungs: Normal work of breathing. Clear to auscultation bilaterally without wheezes or crackles. Heart: Regular rate and rhythm with 1/6 systolic murmur.  No rubs or gallops.  Unable to assess PMI due to body habitus. Abd: Bowel sounds present.  Firm and nontender.  Unable to assess HSM due to body habitus. Ext: No lower extremity  edema.  2+ radial pulses bilaterally.  Left radial arteriotomy site is well-healed. Skin: Warm and dry without rash.  EKG: Normal sinus rhythm with frequent PVCs and nonspecific ST/T changes.  No significant change from prior tracing on 02/06/2019.  Lab Results  Component Value Date   WBC 8.2 02/20/2019   HGB 14.1 02/20/2019   HCT 43.9 02/20/2019   MCV 92.8 02/20/2019   PLT 243 02/20/2019    Lab Results  Component Value Date   NA 139 02/20/2019   K 4.2 02/20/2019   CL 106 02/20/2019   CO2 23 02/20/2019   BUN 18 02/20/2019   CREATININE 1.18 02/20/2019   GLUCOSE 136 (H) 02/20/2019   ALT 17 12/31/2018    Lab Results  Component Value Date   CHOL 226 (H) 12/31/2018   HDL 39 (L) 12/31/2018   LDLCALC Comment 12/31/2018   LDLDIRECT 94 12/31/2018   TRIG 478 (H) 12/31/2018   CHOLHDL 5.8 (H) 12/31/2018    --------------------------------------------------------------------------------------------------  ASSESSMENT AND PLAN: Chronic systolic and diastolic heart failure due to nonischemic cardiomyopathy: Roberto [name] appears  grossly euvolemic on exam today, though body habitus limits evaluation.  He reports gradually improving NYHA class II-III heart failure symptoms.  I would not make any medication changes today, continuing current doses of metoprolol, lisinopril, spironolactone, and furosemide.  Borderline low blood pressure today precludes escalation of evidence-based heart failure therapy.  Roberto [name] should follow-up in the heart failure clinic next month, as previously recommended.  Thoracic aortic aneurysm: Moderately to severely enlarged ascending aorta, currently undergoing evaluation by cardiac surgery.  He should follow-up with Dr. Orvan Seen as previously recommended.  Hopefully, Roberto [name] will be able to proceed with aneurysm repair this fall after optimization of heart failure.  Nonobstructive coronary artery disease: Single-vessel CAD with moderate diagonal stenosis noted by catheterization last month.  We will continue medical therapy and risk factor modification to prevent progression of disease (see details below).  Bicuspid aortic valve with history of mechanical aortic valve replacement: Continue warfarin per Dr. Wynetta Emery and SBE prophylaxis.  Hyperlipidemia: Most recent lipid profile in June notable for total cholesterol 226, triglycerides 478, and HDL 39.  LDL could not be calculated.  Prior LDL in January was 107.  I recommend continuation of simvastatin for now in addition to fish oil 2 gm daily.  I also encouraged lifestyle modifications.  Repeat lipid panel in 3 to 6 months is recommended.  Morbid obesity: I encouraged Roberto [name] to continue with weight loss through diet and low intensity exercises.  I cautioned against more intense exercise and weight lifting in the setting of aortic aneurysm.  I agree with ongoing sleep evaluation for likely OSA.  Follow-up: Return to clinic in 3 months.  Nelva Bush, MD 02/26/2019 8:31 AM

## 2019-02-26 ENCOUNTER — Encounter: Payer: Self-pay | Admitting: Internal Medicine

## 2019-02-26 ENCOUNTER — Other Ambulatory Visit: Payer: Self-pay

## 2019-02-26 ENCOUNTER — Other Ambulatory Visit: Payer: Self-pay | Admitting: Family Medicine

## 2019-02-26 ENCOUNTER — Other Ambulatory Visit: Payer: Self-pay | Admitting: Internal Medicine

## 2019-02-26 ENCOUNTER — Ambulatory Visit (INDEPENDENT_AMBULATORY_CARE_PROVIDER_SITE_OTHER): Payer: Medicare Other | Admitting: Internal Medicine

## 2019-02-26 ENCOUNTER — Other Ambulatory Visit: Payer: Self-pay | Admitting: Nurse Practitioner

## 2019-02-26 VITALS — BP 100/74 | HR 91 | Ht 66.0 in | Wt 341.0 lb

## 2019-02-26 DIAGNOSIS — I2510 Atherosclerotic heart disease of native coronary artery without angina pectoris: Secondary | ICD-10-CM

## 2019-02-26 DIAGNOSIS — Z952 Presence of prosthetic heart valve: Secondary | ICD-10-CM

## 2019-02-26 DIAGNOSIS — I5042 Chronic combined systolic (congestive) and diastolic (congestive) heart failure: Secondary | ICD-10-CM | POA: Diagnosis not present

## 2019-02-26 DIAGNOSIS — E782 Mixed hyperlipidemia: Secondary | ICD-10-CM | POA: Diagnosis not present

## 2019-02-26 DIAGNOSIS — I712 Thoracic aortic aneurysm, without rupture: Secondary | ICD-10-CM | POA: Diagnosis not present

## 2019-02-26 DIAGNOSIS — I7121 Aneurysm of the ascending aorta, without rupture: Secondary | ICD-10-CM

## 2019-02-26 DIAGNOSIS — I428 Other cardiomyopathies: Secondary | ICD-10-CM

## 2019-02-26 MED ORDER — FISH OIL 1000 MG PO CPDR: 2000.0000 mg | DELAYED_RELEASE_CAPSULE | Freq: Every day | ORAL | 2 refills | Status: DC

## 2019-02-26 NOTE — Telephone Encounter (Signed)
Requested medication (s) are due for refill today: yes  Requested medication (s) are on the active medication list: yes  Last refill:    Future visit scheduled: no  Notes to clinic: review for refill   Requested Prescriptions  Pending Prescriptions Disp Refills   warfarin (COUMADIN) 4 MG tablet [Pharmacy Med Name: Warfarin Sodium 4 MG Oral Tablet] 90 tablet 0    Sig: TAKE 1 TABLET BY MOUTH ONCE DAILY AT  6  PM.  TAKE  AS  DIRECTED     Hematology:  Anticoagulants - warfarin Failed - 02/26/2019  7:03 AM      Failed - This refill cannot be delegated      Failed - If the patient is managed by Coumadin Clinic - route to their Pool. If not, forward to the provider.      Passed - INR in normal range and within 30 days    INR  Date Value Ref Range Status  01/28/2019 1.1 0.8 - 1.2 Final    Comment:    (NOTE) INR goal varies based on device and disease states. Performed at [facility], [address] [place], [place] [address]   11/08/2018 2.6 (H) 0.9 - 1.1 Final  05/25/2014 1.9  Final    Comment:    INR reference interval applies to patients on anticoagulant therapy. A single INR therapeutic range for coumarins is not optimal for all indications; however, the suggested range for most indications is 2.0 - 3.0. Exceptions to the INR Reference Range may include: Prosthetic heart valves, acute myocardial infarction, prevention of myocardial infarction, and combinations of aspirin and anticoagulant. The need for a higher or lower target INR must be assessed individually. Reference: The Pharmacology and Management of the Vitamin K  antagonists: the seventh ACCP Conference on Antithrombotic and Thrombolytic Therapy. [id number] Sept:126 (3suppl): [id number]. A HCT value >55% may artifactually increase the PT.  In one study,  the increase was an average of 25%. Reference:  "Effect on Routine and Special Coagulation Testing Values of Citrate Anticoagulant Adjustment in  Patients with High HCT Values." American Journal of Clinical Pathology 2006;126:400-405.          Passed - Valid encounter within last 3 months    Recent Outpatient Visits          2 weeks ago H/O mechanical aortic valve replacement   [facility] [place], Henrine Screws T, NP   2 weeks ago Procedure and treatment not carried out due to patient leaving prior to being seen by health care provider   [facility], [name], DO   3 weeks ago H/O mechanical aortic valve replacement   [facility] [place], [name], DO   1 month ago H/O mechanical aortic valve replacement   [facility] [place], [name], DO   1 month ago H/O mechanical aortic valve replacement   [facility] [place], Barb Merino, DO      Future Appointments            Today End, Harrell Gave, MD [facility], [place]

## 2019-02-26 NOTE — Telephone Encounter (Signed)
Requested medication (s) are due for refill today yes  Requested medication (s) are on the active medication list: yes  Last refill:   Future visit scheduled: no  Notes to clinic:  This refill cannot be delegated   Requested Prescriptions  Pending Prescriptions Disp Refills   allopurinol (ZYLOPRIM) 100 MG tablet [Pharmacy Med Name: Allopurinol 100 MG Oral Tablet] 90 tablet 0    Sig: Take 1 tablet by mouth once daily     Endocrinology:  Gout Agents Passed - 02/26/2019  5:30 AM      Passed - Uric Acid in normal range and within 360 days    Uric Acid  Date Value Ref Range Status  04/03/2018 8.2 3.7 - 8.6 mg/dL Final    Comment:               Therapeutic target for gout patients: <6.0         Passed - Cr in normal range and within 360 days    Creatinine  Date Value Ref Range Status  05/25/2014 0.85 0.60 - 1.30 mg/dL Final   Creatinine, Ser  Date Value Ref Range Status  02/20/2019 1.18 0.61 - 1.24 mg/dL Final         Passed - Valid encounter within last 12 months    Recent Outpatient Visits          2 weeks ago H/O mechanical aortic valve replacement   [place], Henrine Screws T, NP   2 weeks ago Procedure and treatment not carried out due to patient leaving prior to being seen by health care provider   [place], [name], DO   3 weeks ago H/O mechanical aortic valve replacement   [facility] [place], [name], DO   1 month ago H/O mechanical aortic valve replacement   [facility] [place], [name], DO   1 month ago H/O mechanical aortic valve replacement   [facility] [place], [name], DO      Future Appointments            Today End, Harrell Gave, MD [place], [facility]            nortriptyline (PAMELOR) 10 MG capsule [Pharmacy Med Name: Nortriptyline HCl 10 MG Oral Capsule] 30 capsule 0    Sig: Take 1 capsule by mouth at bedtime     Psychiatry:  Antidepressants -  Heterocyclics (TCAs) Failed - 02/26/2019  5:30 AM      Failed - Completed PHQ-2 or PHQ-9 in the last 360 days.      Passed - Valid encounter within last 6 months    Recent Outpatient Visits          2 weeks ago H/O mechanical aortic valve replacement   [facility] [place], Henrine Screws T, NP   2 weeks ago Procedure and treatment not carried out due to patient leaving prior to being seen by health care provider   [facility], [name], DO   3 weeks ago H/O mechanical aortic valve replacement   [facility] [place], [name], DO   1 month ago H/O mechanical aortic valve replacement   [facility], [name], DO   1 month ago H/O mechanical aortic valve replacement   [facility] [place], Barb Merino, DO      Future Appointments            Today End, Harrell Gave, MD [facility], [place]  cyclobenzaprine (FLEXERIL) 10 MG tablet [Pharmacy Med Name: Cyclobenzaprine HCl 10 MG Oral Tablet] 180 tablet 0    Sig: TAKE 1 TABLET BY MOUTH TWICE DAILY AS NEEDED FOR  MUSCLE  SPASM     Not Delegated - Analgesics:  Muscle Relaxants Failed - 02/26/2019  5:30 AM      Failed - This refill cannot be delegated      Passed - Valid encounter within last 6 months    Recent Outpatient Visits          2 weeks ago H/O mechanical aortic valve replacement   [facility] [place], Henrine Screws T, NP   2 weeks ago Procedure and treatment not carried out due to patient leaving prior to being seen by health care provider   [facility], [name], DO   3 weeks ago H/O mechanical aortic valve replacement   [facility] [place], [name], DO   1 month ago H/O mechanical aortic valve replacement   [facility] [place], [name], DO   1 month ago H/O mechanical aortic valve replacement   [facility] [place], Barb Merino, DO      Future Appointments             Today End, Harrell Gave, MD [facility], [place]

## 2019-02-26 NOTE — Patient Instructions (Signed)
Medication Instructions:  Your physician has recommended you make the following change in your medication:  1- START Fish Oil (over the counter) 2 grams (2000 mg) by mouth once a day.   If you need a refill on your cardiac medications before your next appointment, please call your pharmacy.   Lab work: - None ordered.  If you have labs (blood work) drawn today and your tests are completely normal, you will receive your results only by: Marland Kitchen MyChart Message (if you have MyChart) OR . A paper copy in the mail If you have any lab test that is abnormal or we need to change your treatment, we will call you to review the results.  Testing/Procedures: - None ordered.   Follow-Up: At [facility], you and your health needs are our priority.  As part of our continuing mission to provide you with exceptional heart care, we have created designated Provider Care Teams.  These Care Teams include your primary Cardiologist (physician) and Advanced Practice Providers (APPs -  Physician Assistants and Nurse Practitioners) who all work together to provide you with the care you need, when you need it. You will need a follow up appointment in 3 months.  You may see Nelva Bush, MD or one of the following Advanced Practice Providers on your designated Care Team:   Murray Hodgkins, NP Christell Faith, [name] . Marrianne Mood, [name]

## 2019-03-06 ENCOUNTER — Encounter: Payer: Self-pay | Admitting: Family Medicine

## 2019-03-06 DIAGNOSIS — Z952 Presence of prosthetic heart valve: Secondary | ICD-10-CM | POA: Diagnosis not present

## 2019-03-06 DIAGNOSIS — Z7901 Long term (current) use of anticoagulants: Secondary | ICD-10-CM | POA: Diagnosis not present

## 2019-03-06 LAB — POCT INR: INR: 2.4 (ref 2.0–3.0)

## 2019-03-11 ENCOUNTER — Other Ambulatory Visit: Payer: Self-pay | Admitting: Family Medicine

## 2019-03-11 NOTE — Telephone Encounter (Signed)
Requested medication (s) are due for refill today: yes  Requested medication (s) are on the active medication list: yes  Last refill:  08/06/2017  Future visit scheduled: no  Notes to clinic:  Review for refill   Requested Prescriptions  Pending Prescriptions Disp Refills   SPIRIVA HANDIHALER 18 MCG inhalation capsule [Pharmacy Med Name: Spiriva HandiHaler 18 MCG Inhalation Capsule] 30 capsule 0    Sig: PLACE ONE CAPSULE INTO INHALER AND INHALE THE CONTENTS BY MOUTH ONCE DAILY     Pulmonology:  Anticholinergic Agents Passed - 03/11/2019  2:01 PM      Passed - Valid encounter within last 12 months    Recent Outpatient Visits          1 month ago H/O mechanical aortic valve replacement   [place], Henrine Screws T, NP   1 month ago Procedure and treatment not carried out due to patient leaving prior to being seen by health care provider   [place], [name], DO   1 month ago H/O mechanical aortic valve replacement   [facility] [place], [name], DO   1 month ago H/O mechanical aortic valve replacement   [facility] [place], [name], DO   1 month ago H/O mechanical aortic valve replacement   [facility] [place], Barb Merino, DO      Future Appointments            In 2 months End, Harrell Gave, MD [facility], [facility]            Fluticasone-Salmeterol (ADVAIR) 250-50 MCG/DOSE Baltazar Najjar Med Name: Fluticasone-Salmeterol 250-50 MCG/DOSE Inhalation Aerosol Powder Breath Activated] 60 each 0    Sig: INHALE ONE PUFF INTO THE LUNGS TWICE DAILY     There is no refill protocol information for this order

## 2019-03-14 ENCOUNTER — Encounter: Payer: Self-pay | Admitting: Family Medicine

## 2019-03-14 LAB — POCT INR: INR: 2.6 (ref 2.0–3.0)

## 2019-03-20 ENCOUNTER — Other Ambulatory Visit: Payer: Self-pay

## 2019-03-20 ENCOUNTER — Encounter (HOSPITAL_COMMUNITY): Payer: Self-pay | Admitting: Cardiology

## 2019-03-20 ENCOUNTER — Ambulatory Visit (HOSPITAL_COMMUNITY)
Admission: RE | Admit: 2019-03-20 | Discharge: 2019-03-20 | Disposition: A | Discharge status: Home / Self Care | Payer: Medicare Other | Source: Ambulatory Visit | Attending: Cardiology | Admitting: Cardiology

## 2019-03-20 VITALS — BP 107/60 | HR 80 | Wt 347.4 lb

## 2019-03-20 DIAGNOSIS — I5022 Chronic systolic (congestive) heart failure: Secondary | ICD-10-CM | POA: Diagnosis not present

## 2019-03-20 DIAGNOSIS — Z8249 Family history of ischemic heart disease and other diseases of the circulatory system: Secondary | ICD-10-CM | POA: Insufficient documentation

## 2019-03-20 DIAGNOSIS — Z952 Presence of prosthetic heart valve: Secondary | ICD-10-CM | POA: Diagnosis not present

## 2019-03-20 DIAGNOSIS — Z79899 Other long term (current) drug therapy: Secondary | ICD-10-CM | POA: Diagnosis not present

## 2019-03-20 DIAGNOSIS — I428 Other cardiomyopathies: Secondary | ICD-10-CM | POA: Insufficient documentation

## 2019-03-20 DIAGNOSIS — Z833 Family history of diabetes mellitus: Secondary | ICD-10-CM | POA: Insufficient documentation

## 2019-03-20 DIAGNOSIS — Z7901 Long term (current) use of anticoagulants: Secondary | ICD-10-CM | POA: Insufficient documentation

## 2019-03-20 DIAGNOSIS — Q231 Congenital insufficiency of aortic valve: Secondary | ICD-10-CM | POA: Insufficient documentation

## 2019-03-20 DIAGNOSIS — I7121 Aneurysm of the ascending aorta, without rupture: Secondary | ICD-10-CM

## 2019-03-20 DIAGNOSIS — Z8673 Personal history of transient ischemic attack (TIA), and cerebral infarction without residual deficits: Secondary | ICD-10-CM | POA: Insufficient documentation

## 2019-03-20 DIAGNOSIS — I712 Thoracic aortic aneurysm, without rupture: Secondary | ICD-10-CM | POA: Diagnosis not present

## 2019-03-20 DIAGNOSIS — Z823 Family history of stroke: Secondary | ICD-10-CM | POA: Insufficient documentation

## 2019-03-20 DIAGNOSIS — E785 Hyperlipidemia, unspecified: Secondary | ICD-10-CM | POA: Diagnosis not present

## 2019-03-20 DIAGNOSIS — J449 Chronic obstructive pulmonary disease, unspecified: Secondary | ICD-10-CM

## 2019-03-20 DIAGNOSIS — I2510 Atherosclerotic heart disease of native coronary artery without angina pectoris: Secondary | ICD-10-CM | POA: Diagnosis not present

## 2019-03-20 DIAGNOSIS — Z800 Family history of malignant neoplasm of digestive organs: Secondary | ICD-10-CM | POA: Diagnosis not present

## 2019-03-20 DIAGNOSIS — G4733 Obstructive sleep apnea (adult) (pediatric): Secondary | ICD-10-CM | POA: Diagnosis not present

## 2019-03-20 DIAGNOSIS — Z7982 Long term (current) use of aspirin: Secondary | ICD-10-CM | POA: Insufficient documentation

## 2019-03-20 DIAGNOSIS — F17210 Nicotine dependence, cigarettes, uncomplicated: Secondary | ICD-10-CM | POA: Insufficient documentation

## 2019-03-20 DIAGNOSIS — Z803 Family history of malignant neoplasm of breast: Secondary | ICD-10-CM | POA: Insufficient documentation

## 2019-03-20 LAB — BASIC METABOLIC PANEL
Anion gap: 11 (ref 5–15)
BUN: 22 mg/dL (ref 8–23)
CO2: 23 mmol/L (ref 22–32)
Calcium: 9.1 mg/dL (ref 8.9–10.3)
Chloride: 102 mmol/L (ref 98–111)
Creatinine, Ser: 1.21 mg/dL (ref 0.61–1.24)
GFR calc Af Amer: >60 mL/min (ref >60)
GFR calc non Af Amer: >60 mL/min (ref >60)
Glucose, Bld: 120 mg/dL — ABNORMAL HIGH (ref 70–99)
Potassium: 4.3 mmol/L (ref 3.5–5.1)
Sodium: 136 mmol/L (ref 135–145)

## 2019-03-20 LAB — POCT INR

## 2019-03-20 MED ORDER — FUROSEMIDE 40 MG PO TABS: 60.0000 mg | ORAL_TABLET | Freq: Two times a day (BID) | ORAL | 3 refills | Status: DC

## 2019-03-20 MED ORDER — BUPROPION HCL ER (XL) 150 MG PO TB24: ORAL_TABLET | ORAL | 3 refills | Status: DC

## 2019-03-20 NOTE — Patient Instructions (Addendum)
Lab work done today. We will notify you of any abnormal lab work. No news is good news!  Lab work will need to be done again in 10 days. This will be done at [facility].    Please call Itamar Sleep study in order to obtain a new test.  Your physician has recommended that you have a pulmonary function test. Pulmonary Function Tests are a group of tests that measure how well air moves in and out of your lungs. Someone will call you in order to schedule this test.  START Wellburtrin 150mg .   INCREASE Furosemide to 60mg  two times daily.  Please limit your fluid to 2 liters or less in a 24 hour period.   Please follow up with the [facility] in 3 weeks and again in 6 weeks.  At the [facility], you and your health needs are our priority. As part of our continuing mission to provide you with exceptional heart care, we have created designated Provider Care Teams. These Care Teams include your primary Cardiologist (physician) and Advanced Practice Providers (APPs- Physician Assistants and Nurse Practitioners) who all work together to provide you with the care you need, when you need it.   You may see any of the following providers on your designated Care Team at your next follow up: Marland Kitchen Dr Glori Bickers . Dr Loralie Champagne . Darrick Grinder, NP   Please be sure to bring in all your medications bottles to every appointment.

## 2019-03-20 NOTE — Progress Notes (Signed)
PCP: Valerie Roys, DO HF Cardiology: Dr. Aundra Dubin  63 y.o. with history of bicuspid aortic valve disorder s/p mechanical AVR in 9/11 and 5.5 cm ascending aortic aneurysm on most recent CT in [id number], chronic systolic CHF, and smoking was referred by Dr. Orvan Seen for optimization of CHF prior to ascending aorta replacement surgery.  Most recent echo in 7/20 showed EF 40-45% with stable mechanical aortic valve.  RHC/LHC in 7/20 showed nonobstructive CAD and R>L sided failure.    Patient returns today for followup of CHF.  He is currently taking Lasix 40 mg bid.  I had increased his Lasix at last appointment but it was eventually cut down due to rise in creatinine.  His breathing felt better on the higher dose. Weight is down 2 lbs.  He is still drinking a lot of fluid daily.  He is short of breath with moderate exertion like trying to mow the grass or walking 50-100 feet.  +Bendopnea.  +Orthopnea.  Still smoking about 1/2 ppd, trying to quit using nicotine patches.  He was unable to tolerate Chantix.  He had technical problems with his home sleep study so is doing it again.   Labs (6/20): LDL 94 Labs (7/20): K 4.4, creatinine 1.13 Labs (8/20): K 4.2, creatinine 1.41 => 1.18  PMH: 1. Bicuspid aortic valve: s/p mechanical AVR 9/11 2. H/o CVA 3. Hyperlipidemia 4. H/o renal infarct 5. Active smoker: Could not tolerate Chantix 6. Chronic systolic CHF: Nonischemic cardiomyopathy.  - Echo (7/20): EF 40-45%, normal RV, mechanical aortic valve with mean gradient 15 mmHg - LHC/RHC (7/20): 60% stenosis D1; mean RA 22, RV 45/26 mean 36, mean PCWP 23, CI 2.4 7. Ascending aortic aneurysm: Suspect related to bicuspid aortic valve.  - CTA (7/20) with 5.5 cm ascending aorta.   Social History   Socioeconomic History  . Marital status: Single    Spouse name: Not on file  . Number of children: 1  . Years of education: 4  . Highest education level: Associate degree: academic program  Occupational History  .  Occupation: Disabled    Fish farm manager: UNEMPLOYED  Social Needs  . Financial resource strain: Not very hard  . Food insecurity    Worry: Never true    Inability: Never true  . Transportation needs    Medical: No    Non-medical: No  Tobacco Use  . Smoking status: Current Every Day Smoker    Packs/day: 0.50    Years: 46.00    Pack years: 23.00    Types: Cigarettes    Start date: 07/16/2017  . Smokeless tobacco: Never Used  . Tobacco comment: 10 cigarettes a day  Substance and Sexual Activity  . Alcohol use: No    Alcohol/week: 0.0 standard drinks  . Drug use: No  . Sexual activity: Not Currently  Lifestyle  . Physical activity    Days per week: 0 days    Minutes per session: 0 min  . Stress: Not at all  Relationships  . Social Herbalist on phone: Never    Gets together: Never    Attends religious service: Never    Active member of club or organization: No    Attends meetings of clubs or organizations: Never    Relationship status: Married  . Intimate partner violence    Fear of current or ex partner: No    Emotionally abused: No    Physically abused: No    Forced sexual activity: No  Other Topics Concern  .  Not on file  Social History Narrative  . Not on file   Family History  Problem Relation Age of Onset  . Arthritis Mother   . Dementia Mother   . Colon cancer Mother   . Arthritis Father   . Diabetes Father   . Stroke Father   . Colon cancer Father   . Heart attack Brother   . Breast cancer Sister   . Seizures Sister   . Cancer Brother        brain  . Heart disease Brother   . Heart attack Brother    ROS: All systems reviewed and negative except as per HPI.  Current Outpatient Medications  Medication Sig Dispense Refill  . albuterol (PROVENTIL HFA;VENTOLIN HFA) 108 (90 Base) MCG/ACT inhaler Inhale 2 puffs into the lungs every 6 (six) hours as needed for wheezing or shortness of breath. 3 Inhaler 3  . albuterol (PROVENTIL) (2.5 MG/3ML) 0.083%  nebulizer solution Take 3 mLs (2.5 mg total) by nebulization every 6 (six) hours as needed for wheezing or shortness of breath. 150 mL 1  . allopurinol (ZYLOPRIM) 100 MG tablet Take 1 tablet by mouth once daily 90 tablet 0  . aspirin 81 MG chewable tablet Chew 81 mg by mouth daily.      . cyclobenzaprine (FLEXERIL) 10 MG tablet Take 1 tablet (10 mg total) by mouth 3 times/day as needed-between meals & bedtime for muscle spasms. 180 tablet 0  . Fluticasone-Salmeterol (ADVAIR) 250-50 MCG/DOSE AEPB Inhale 1 puff into the lungs as needed.    . furosemide (LASIX) 40 MG tablet Take 1.5 tablets (60 mg total) by mouth 2 (two) times daily. 45 tablet 3  . lisinopril (ZESTRIL) 5 MG tablet Take 1 tablet by mouth once daily 90 tablet 3  . metoprolol succinate (TOPROL-XL) 100 MG 24 hr tablet TAKE 1 TABLET BY MOUTH ONCE DAILY WITH  OR  IMMEDIATELY  FOLLOWING  A  MEAL 90 tablet 3  . nicotine (NICODERM CQ - DOSED IN MG/24 HR) 7 mg/24hr patch Place 1 patch (7 mg total) onto the skin daily. 30 patch 0  . nortriptyline (PAMELOR) 10 MG capsule Take 1 capsule by mouth at bedtime 90 capsule 0  . Omega-3 Fatty Acids (FISH OIL) 1000 MG CPDR Take 2,000 mg by mouth daily. 180 capsule 2  . potassium chloride SA (K-DUR) 20 MEQ tablet Take 1 tablet (20 mEq total) by mouth daily. 90 tablet 3  . simvastatin (ZOCOR) 40 MG tablet Take 1 tablet (40 mg total) by mouth at bedtime. 90 tablet 1  . SPIRIVA HANDIHALER 18 MCG inhalation capsule PLACE ONE CAPSULE INTO INHALER AND INHALE THE CONTENTS BY MOUTH ONCE DAILY 30 capsule 0  . spironolactone (ALDACTONE) 25 MG tablet Take 1 tablet (25 mg total) by mouth daily. 90 tablet 3  . warfarin (COUMADIN) 4 MG tablet TAKE 1 TABLET BY MOUTH ONCE DAILY AT  6  PM.  TAKE  AS  DIRECTED 90 tablet 3  . warfarin (COUMADIN) 7.5 MG tablet Take 7.5 mg by mouth See admin instructions. 4 mg for 2 days, 7.5 mg on the 3rd day, and repeat    . buPROPion (WELLBUTRIN XL) 150 MG 24 hr tablet Take 1 tablet (150 mg  total) by mouth daily for 3 days, THEN 1 tablet (150 mg total) 2 (two) times daily. 180 tablet 3   No current facility-administered medications for this encounter.    BP 107/60   Pulse 80   Wt (!) 157.6 kg (347  lb 6.4 oz)   SpO2 99%   BMI 56.07 kg/m  General: NAD, obese.  Neck: Thick, difficult JVP but estimate 8-9 cm, no thyromegaly or thyroid nodule.  Lungs: Clear to auscultation bilaterally with normal respiratory effort. CV: Nonpalpable PMI.  Heart regular S1/S2 with mechanical S2, no S3/S4, 2/6 SEM RUSB.  1+ ankle edema bilaterally.  No carotid bruit.  Normal pedal pulses.  Abdomen: Soft, nontender, no hepatosplenomegaly, no distention.  Skin: Intact without lesions or rashes.  Neurologic: Alert and oriented x 3.  Psych: Normal affect. Extremities: No clubbing or cyanosis.  HEENT: Normal.   Assessment/Plan: 1. Chronic systolic CHF: Most recent echo with EF 40-45% in 7/20.  RHC in 7/20 showed Right > left-sided CHF.  I suspect that severe sleep apnea plays a role in right-sided HF.  Coronary angiography in 7/20 showed nonobstructive CAD.  He is still volume overloaded on exam.  He had a mild rise in creatinine recently and Lasix was cut back.  I think he is going to need a higher diuretic dose, will tolerate a mild rise in creatinine.   - Increase Lasix to 60 mg bid.  BMET today and again in 10 days.  - Continue spironolactone 25 mg daily.  - Continue current lisinopril and Toprol XL.  2. OSA: Strongly suspect OSA.  He did a home sleep study, but there were technical problems.  - He has called to arrange a repeat home sleep study.   3. CAD: Nonobstructive.  I would aim for LDL < 70.  - Continue ASA 81.  - LDL recently was 94, he has been working with Christell Faith regarding lipid management (needs intensified statin).  4. Mechanical aortic valve: Stable function on 7/20 echo.  Given history of CVA, needs Lovenox bridging whenever INR subtherapeutic.  He is on ASA 81 also.  5. Smoking:  I strongly encouraged him to quit.  He did not tolerate Chantix.  He is using nicotine patches.  I also gave him a prescription for Wellbutrin today to try this for aide in smoking cessation.   6. Ascending aortic aneurysm: Suspect this is related to his bicuspid aortic valve.  Last CTA in 7/20 showed aneurysm 5.5 cm.   - He is seeing Dr. Orvan Seen, will need ascending aorta replacement when CHF is managed.   Followup with my NP in 3 wks and me in 6 wks.  Will need to go back to Dr. Orvan Seen soon for surgical evaluation.   Loralie Champagne 03/20/2019

## 2019-03-25 ENCOUNTER — Other Ambulatory Visit
Admission: RE | Admit: 2019-03-25 | Discharge: 2019-03-25 | Disposition: A | Discharge status: Home / Self Care | Payer: Medicare Other | Source: Ambulatory Visit | Attending: Cardiology | Admitting: Cardiology

## 2019-03-25 ENCOUNTER — Other Ambulatory Visit: Payer: Self-pay

## 2019-03-25 DIAGNOSIS — Z01812 Encounter for preprocedural laboratory examination: Secondary | ICD-10-CM | POA: Insufficient documentation

## 2019-03-25 DIAGNOSIS — Z20828 Contact with and (suspected) exposure to other viral communicable diseases: Secondary | ICD-10-CM | POA: Insufficient documentation

## 2019-03-26 LAB — POCT INR

## 2019-03-26 LAB — SARS CORONAVIRUS 2 (TAT 6-24 HRS): SARS Coronavirus 2: NEGATIVE

## 2019-03-28 ENCOUNTER — Other Ambulatory Visit: Payer: Self-pay

## 2019-03-28 ENCOUNTER — Ambulatory Visit (HOSPITAL_COMMUNITY)
Admission: RE | Admit: 2019-03-28 | Discharge: 2019-03-28 | Disposition: A | Discharge status: Home / Self Care | Payer: Medicare Other | Source: Ambulatory Visit | Attending: Cardiology | Admitting: Cardiology

## 2019-03-28 DIAGNOSIS — I5022 Chronic systolic (congestive) heart failure: Secondary | ICD-10-CM

## 2019-03-28 DIAGNOSIS — J449 Chronic obstructive pulmonary disease, unspecified: Secondary | ICD-10-CM | POA: Diagnosis not present

## 2019-03-28 LAB — PULMONARY FUNCTION TEST
DL/VA % pred: 100 %
DL/VA: 4.25 ml/min/mmHg/L
DLCO unc % pred: 82 %
DLCO unc: 19.52 ml/min/mmHg
FEF 25-75 Post: 2.4 L/sec
FEF 25-75 Pre: 1.69 L/sec
FEF2575-%Change-Post: 41 %
FEF2575-%Pred-Post: 99 %
FEF2575-%Pred-Pre: 70 %
FEV1-%Change-Post: 6 %
FEV1-%Pred-Post: 74 %
FEV1-%Pred-Pre: 69 %
FEV1-Post: 2.21 L
FEV1-Pre: 2.07 L
FEV1FVC-%Change-Post: 6 %
FEV1FVC-%Pred-Pre: 102 %
FEV6-%Change-Post: 0 %
FEV6-%Pred-Post: 71 %
FEV6-%Pred-Pre: 71 %
FEV6-Post: 2.69 L
FEV6-Pre: 2.69 L
FEV6FVC-%Change-Post: 0 %
FEV6FVC-%Pred-Post: 105 %
FEV6FVC-%Pred-Pre: 105 %
FVC-%Change-Post: 0 %
FVC-%Pred-Post: 67 %
FVC-%Pred-Pre: 67 %
FVC-Post: 2.69 L
FVC-Pre: 2.69 L
Post FEV1/FVC ratio: 82 %
Post FEV6/FVC ratio: 100 %
Pre FEV1/FVC ratio: 77 %
Pre FEV6/FVC Ratio: 100 %

## 2019-03-28 MED ORDER — ALBUTEROL SULFATE (2.5 MG/3ML) 0.083% IN NEBU
2.5000 mg | INHALATION_SOLUTION | Freq: Once | RESPIRATORY_TRACT | Status: AC
  Administered 2019-03-28: 2.5 mg via RESPIRATORY_TRACT

## 2019-04-01 ENCOUNTER — Telehealth: Payer: Self-pay | Admitting: Internal Medicine

## 2019-04-01 NOTE — Telephone Encounter (Signed)
Attempted to call patient. LMTCB 04/01/2019  I think pt will likely need to call pulm or whoever ordered home sleep study kit.

## 2019-04-01 NOTE — Telephone Encounter (Signed)
Returned call to patient and left detailed message with information to contact [employer] support [phone]. Lindley Magnus, RN from Dr. Oleh Genin office suggested if he has any difficulty reaching company, to call their office for further support.   Advised pt to call for any further questions or concerns.

## 2019-04-01 NOTE — Telephone Encounter (Signed)
Incoming call from Vangie Bicker, [place].   Home "Itamar" sleep study kit ordered my Dr. Aundra Dubin. This is 2nd kit. Per [name], first kit worked fine and they sent back as requested. 2nd kit arrived and has no instructions or battery. She attempted to call manufacturer to get more information and said they were unable to assist.   I reached out to Dr. Oleh Genin RN for patient assistance number that I can provide to patient.   I will call back when I have more information.

## 2019-04-01 NOTE — Telephone Encounter (Signed)
° °  Vangie Bicker (friend) calling, states patient received an incomplete home sleep study kit. She has called the company where the kit came from however she was told they could not help her. Does another home sleep study need to be ordered?

## 2019-04-02 ENCOUNTER — Encounter: Payer: Self-pay | Admitting: Family Medicine

## 2019-04-02 DIAGNOSIS — Z7901 Long term (current) use of anticoagulants: Secondary | ICD-10-CM | POA: Diagnosis not present

## 2019-04-02 DIAGNOSIS — Z952 Presence of prosthetic heart valve: Secondary | ICD-10-CM | POA: Diagnosis not present

## 2019-04-02 LAB — POCT INR

## 2019-04-08 ENCOUNTER — Encounter: Payer: Self-pay | Admitting: Family Medicine

## 2019-04-08 ENCOUNTER — Other Ambulatory Visit: Payer: Self-pay

## 2019-04-08 ENCOUNTER — Ambulatory Visit (INDEPENDENT_AMBULATORY_CARE_PROVIDER_SITE_OTHER): Payer: Medicare Other | Admitting: Family Medicine

## 2019-04-08 VITALS — BP 94/64 | HR 86 | Temp 98.6°F | Ht 66.0 in | Wt 345.0 lb

## 2019-04-08 DIAGNOSIS — J441 Chronic obstructive pulmonary disease with (acute) exacerbation: Secondary | ICD-10-CM | POA: Diagnosis not present

## 2019-04-08 DIAGNOSIS — I2510 Atherosclerotic heart disease of native coronary artery without angina pectoris: Secondary | ICD-10-CM

## 2019-04-08 MED ORDER — PREDNISONE 20 MG PO TABS: 40.0000 mg | ORAL_TABLET | Freq: Every day | ORAL | 0 refills | Status: DC

## 2019-04-08 MED ORDER — AZITHROMYCIN 250 MG PO TABS: ORAL_TABLET | ORAL | 0 refills | Status: DC

## 2019-04-08 NOTE — Progress Notes (Signed)
BP 94/64   Pulse 86   Temp 98.6 F (37 C)   Ht 5\' 6"  (1.676 m)   Wt (!) 345 lb (156.5 kg)   BMI 55.68 kg/m    Subjective:    Patient ID: Roberto [name], male    DOB: [birth year], 63 y.o.   MRN: [id number]  HPI: Race Hider is a 63 y.o. male  Chief Complaint  Patient presents with  . Cough    Dry Cough. Ongoing 4-5 days. Patient states it sounds like a "whooping cough" sometimes.  . Muscle Pain  . Generalized Body Aches  . Fatigue    . This visit was completed via WebEx due to the restrictions of the COVID-19 pandemic. All issues as above were discussed and addressed. Physical exam was done as above through visual confirmation on WebEx. If it was felt that the patient should be evaluated in the office, they were directed there. The patient verbally consented to this visit. . Location of the patient: home . Location of the provider: home . Those involved with this call:  . Provider: Merrie Roof, [name] . CMA: Merilyn Baba, [place] . Front Desk/Registration: Jill Side  . Time spent on call: 15 minutes with patient face to face via video conference. More than 50% of this time was spent in counseling and coordination of care. 5 minutes total spent in review of patient's record and preparation of their chart. I verified patient identity using two factors (patient name and date of birth). Patient consents verbally to being seen via telemedicine visit today.   Dry hacking severe cough, SOB, body aches, fatigue x 5 days. Denies fever, chills, CP, N/V/D, sick contacts. Hx of COPD compliant with inhaler regimen. Gets frequent bouts of bronchitis. Recently COVID neg a little over a week ago during a hospital encounter.   Relevant past medical, surgical, family and social history reviewed and updated as indicated. Interim medical history since our last visit reviewed. Allergies and medications reviewed and updated.  Review of Systems  Per HPI unless specifically indicated above      Objective:    BP 94/64   Pulse 86   Temp 98.6 F (37 C)   Ht 5\' 6"  (1.676 m)   Wt (!) 345 lb (156.5 kg)   BMI 55.68 kg/m   Wt Readings from Last 3 Encounters:  04/08/19 (!) 345 lb (156.5 kg)  03/20/19 (!) 347 lb 6.4 oz (157.6 kg)  02/26/19 (!) 341 lb (154.7 kg)    Physical Exam Vitals signs and nursing note reviewed.  Constitutional:      General: He is not in acute distress.    Appearance: Normal appearance.  HENT:     Head: Atraumatic.     Right Ear: External ear normal.     Left Ear: External ear normal.     Nose: Nose normal. No congestion.     Mouth/Throat:     Mouth: Mucous membranes are moist.     Pharynx: Oropharynx is clear.  Eyes:     Extraocular Movements: Extraocular movements intact.     Conjunctiva/sclera: Conjunctivae normal.  Neck:     Musculoskeletal: Normal range of motion.  Pulmonary:     Effort: Pulmonary effort is normal. No respiratory distress.  Musculoskeletal: Normal range of motion.  Skin:    General: Skin is dry.     Findings: No erythema or rash.  Neurological:     Mental Status: He is oriented to person, place, and time.  Psychiatric:  Mood and Affect: Mood normal.        Thought Content: Thought content normal.        Judgment: Judgment normal.     Results for orders placed or performed during the hospital encounter of 03/28/19  Pulmonary function test  Result Value Ref Range   FVC-Pre 2.69 L   FVC-%Pred-Pre 67 %   FVC-Post 2.69 L   FVC-%Pred-Post 67 %   FVC-%Change-Post 0 %   FEV1-Pre 2.07 L   FEV1-%Pred-Pre 69 %   FEV1-Post 2.21 L   FEV1-%Pred-Post 74 %   FEV1-%Change-Post 6 %   FEV6-Pre 2.69 L   FEV6-%Pred-Pre 71 %   FEV6-Post 2.69 L   FEV6-%Pred-Post 71 %   FEV6-%Change-Post 0 %   Pre FEV1/FVC ratio 77 %   FEV1FVC-%Pred-Pre 102 %   Post FEV1/FVC ratio 82 %   FEV1FVC-%Change-Post 6 %   Pre FEV6/FVC Ratio 100 %   FEV6FVC-%Pred-Pre 105 %   Post FEV6/FVC ratio 100 %   FEV6FVC-%Pred-Post 105 %    FEV6FVC-%Change-Post 0 %   FEF 25-75 Pre 1.69 L/sec   FEF2575-%Pred-Pre 70 %   FEF 25-75 Post 2.40 L/sec   FEF2575-%Pred-Post 99 %   FEF2575-%Change-Post 41 %   DLCO unc 19.52 ml/min/mmHg   DLCO unc % pred 82 %   DL/VA 4.25 ml/min/mmHg/L   DL/VA % pred 100 %      Assessment & Plan:   Problem List Items Addressed This Visit      Respiratory   COPD (chronic obstructive pulmonary disease) ([place]) - Primary    Will tx with zpak, prednisone, and continued inhaler regimen. F/u in person if not improving. Supportive care reviewed at length      Relevant Medications   predniSONE (DELTASONE) 20 MG tablet   azithromycin (ZITHROMAX) 250 MG tablet       Follow up plan: Return if symptoms worsen or fail to improve.

## 2019-04-09 ENCOUNTER — Ambulatory Visit: Payer: Self-pay | Admitting: Pharmacist

## 2019-04-09 DIAGNOSIS — Z952 Presence of prosthetic heart valve: Secondary | ICD-10-CM

## 2019-04-09 DIAGNOSIS — I509 Heart failure, unspecified: Secondary | ICD-10-CM

## 2019-04-09 NOTE — Patient Instructions (Signed)
Visit Information  Goals Addressed            This Visit's Progress     Patient Stated   . PharmD "We are having a hard time affording his medications" (pt-stated)       Current Barriers:  . Financial Barriers: patient has Medicare A/B and a Prescription insurance on file, though caregiver reports no insurance plan. She reports that they have been unable to afford Spiriva, Advair, or albuterol at this time.  . She also notes that medical bills have become expensive as he is preparing for aortic replacement surgery. She notes that she has a debt at [facility] after having to pick up Lovenox for bridging.  . Reports that they are working to apply for Medicaid, but that they continue to have problems with the application. She is unsure what to do.   Pharmacist Clinical Goal(s):  Marland Kitchen Over the next 30 days, patient will work with PharmD and providers to relieve medication access concerns  Interventions: . Collaborate w/ LCSW to support w/ Medicaid application process . [place] referral for other community resource/financial help . Will collaborate w/ RN CM for care management support  . Patient's caregiver reported that he did not have drug coverage, however, confirmed w/ [employer] that he does have a Part D plan. Patient may qualify for prescription assistance programs for inhalers. Attempted to call caregiver [name] back to discuss current out of pocket spend total; left message for her to return my call at her convenience. Will also plan to discuss total household income and if patient would qualify for Medicare Extra Help.   Patient Self Care Activities:  . Patient will provide necessary portions of application   Initial goal documentation        Mr. Slaven was given information about Chronic Care Management services today including:  1. CCM service includes personalized support from designated clinical staff supervised by his physician, including individualized plan of care  and coordination with other care providers 2. 24/7 contact phone numbers for assistance for urgent and routine care needs. 3. Service will only be billed when office clinical staff spend 20 minutes or more in a month to coordinate care. 4. Only one practitioner may furnish and bill the service in a calendar month. 5. The patient may stop CCM services at any time (effective at the end of the month) by phone call to the office staff. 6. The patient will be responsible for cost sharing (co-pay) of up to 20% of the service fee (after annual deductible is met).  Patient agreed to services and verbal consent obtained.   The patient verbalized understanding of instructions provided today and declined a print copy of patient instruction materials.   Plan: - Will await call back from Vangie Bicker, and will collaborate w/ CCM team for support  Catie Darnelle Maffucci, PharmD Clinical Pharmacist [place] [phone]

## 2019-04-09 NOTE — Chronic Care Management (AMB) (Signed)
Chronic Care Management   Note  04/09/2019 Name: Roberto [name] MRN: [id number] DOB: 03/02/1956   Subjective:  Roberto [name] is a 63 y.o. year old male who is a primary care patient of Valerie Roys, DO. The CCM team was consulted for assistance with chronic disease management and care coordination needs.    While speaking with patients caregiver Roberto [name], on [place]), it was noted that patient was in need of medication access and financial support.   Roberto [name] was given information about Chronic Care Management services today including:  1. CCM service includes personalized support from designated clinical staff supervised by his physician, including individualized plan of care and coordination with other care providers 2. 24/7 contact phone numbers for assistance for urgent and routine care needs. 3. Service will only be billed when office clinical staff spend 20 minutes or more in a month to coordinate care. 4. Only one practitioner may furnish and bill the service in a calendar month. 5. The patient may stop CCM services at any time (effective at the end of the month) by phone call to the office staff. 6. The patient will be responsible for cost sharing (co-pay) of up to 20% of the service fee (after annual deductible is met).  Patient agreed to services and verbal consent obtained.   Review of patient status, including review of consultants reports, laboratory and other test data, was performed as part of comprehensive evaluation and provision of chronic care management services.   Objective:  Lab Results  Component Value Date   CREATININE 1.21 03/20/2019   CREATININE 1.18 02/20/2019   CREATININE 1.41 (H) 02/17/2019    Lab Results  Component Value Date   HGBA1C 6.0 07/25/2018       Component Value Date/Time   CHOL 226 (H) 12/31/2018 0835   CHOL 177 05/17/2017 1311   CHOL 131 05/25/2014 0556   TRIG 478 (H) 12/31/2018 0835   TRIG 361 (H) 05/17/2017 1311   TRIG  167 05/25/2014 0556   HDL 39 (L) 12/31/2018 0835   HDL 30 (L) 05/25/2014 0556   CHOLHDL 5.8 (H) 12/31/2018 0835   CHOLHDL 4.9 03/15/2018 0935   VLDL 63 (H) 03/15/2018 0935   VLDL 72 (H) 05/17/2017 1311   VLDL 33 05/25/2014 0556   LDLCALC Comment 12/31/2018 0835   LDLCALC 68 05/25/2014 0556   LDLDIRECT 94 12/31/2018 0835    Clinical ASCVD: No  The 10-year ASCVD risk score Mikey Bussing DC Jr., et al., 2013) is: 26.3%   Values used to calculate the score:     Age: 43 years     Sex: Male     Is Non-Hispanic African American: No     Diabetic: Yes     Tobacco smoker: Yes     Systolic Blood Pressure: 94 mmHg     Is BP treated: Yes     HDL Cholesterol: 39 mg/dL     Total Cholesterol: 226 mg/dL    BP Readings from Last 3 Encounters:  04/08/19 94/64  03/20/19 107/60  02/26/19 100/74    No Known Allergies  Medications Reviewed Today    Reviewed by Sandria Manly, CMA (Certified Medical Assistant) on 04/08/19 at [phone]  Med List Status: <None>  Medication Order Taking? Sig Documenting Provider Last Dose Status Informant  albuterol (PROVENTIL HFA;VENTOLIN HFA) 108 (90 Base) MCG/ACT inhaler [id number] Yes Inhale 2 puffs into the lungs every 6 (six) hours as needed for wheezing or shortness of breath. Valerie Roys, DO Taking Active  Care Giver  albuterol (PROVENTIL) (2.5 MG/3ML) 0.083% nebulizer solution [id number] Yes Take 3 mLs (2.5 mg total) by nebulization every 6 (six) hours as needed for wheezing or shortness of breath. Valerie Roys, DO Taking Active Care Giver  allopurinol (ZYLOPRIM) 100 MG tablet [id number] Yes Take 1 tablet by mouth once daily Valerie Roys, DO Taking Active   aspirin 81 MG chewable tablet [id number] Yes Chew 81 mg by mouth daily.   [provider] Taking Active Care Giver  buPROPion (WELLBUTRIN XL) 150 MG 24 hr tablet [id number] Yes Take 1 tablet (150 mg total) by mouth daily for 3 days, THEN 1 tablet (150 mg total) 2 (two) times daily. Larey Dresser, MD  Taking Active   cyclobenzaprine (FLEXERIL) 10 MG tablet [id number] Yes Take 1 tablet (10 mg total) by mouth 3 times/day as needed-between meals & bedtime for muscle spasms. Valerie Roys, DO Taking Active Care Giver           Med Note [facility], PHILICIA R   Thu Feb 06, 2019  2:03 PM)    Fluticasone-Salmeterol (ADVAIR) 250-50 MCG/DOSE AEPB [id number] Yes Inhale 1 puff into the lungs as needed. [provider] Taking Active   furosemide (LASIX) 40 MG tablet [id number] Yes Take 1.5 tablets (60 mg total) by mouth 2 (two) times daily. Larey Dresser, MD Taking Active   lisinopril (ZESTRIL) 5 MG tablet [id number] Yes Take 1 tablet by mouth once daily End, Harrell Gave, MD Taking Active   metoprolol succinate (TOPROL-XL) 100 MG 24 hr tablet [id number] Yes TAKE 1 TABLET BY MOUTH ONCE DAILY WITH  OR  IMMEDIATELY  FOLLOWING  A  MEAL End, Harrell Gave, MD Taking Active   nicotine (NICODERM CQ - DOSED IN MG/24 HR) 7 mg/24hr patch [id number] No Place 1 patch (7 mg total) onto the skin daily.  Patient not taking: Reported on 04/08/2019   Darrick Grinder D, NP Not Taking Active   nortriptyline (PAMELOR) 10 MG capsule [id number] Yes Take 1 capsule by mouth at bedtime Park Liter P, DO Taking Active   Omega-3 Fatty Acids (FISH OIL) 1000 MG CPDR [id number] Yes Take 2,000 mg by mouth daily. End, Harrell Gave, MD Taking Active   potassium chloride SA (K-DUR) 20 MEQ tablet [id number] Yes Take 1 tablet (20 mEq total) by mouth daily. Larey Dresser, MD Taking Active   simvastatin (ZOCOR) 40 MG tablet [id number] Yes Take 1 tablet (40 mg total) by mouth at bedtime. Valerie Roys, DO Taking Active Care Giver  [facility] HANDIHALER 18 MCG inhalation capsule [id number] Yes PLACE ONE CAPSULE INTO INHALER AND INHALE THE CONTENTS BY MOUTH ONCE DAILY Wynetta Emery, [name], DO Taking Active   spironolactone (ALDACTONE) 25 MG tablet [id number] Yes Take 1 tablet (25 mg total) by mouth daily. Larey Dresser, MD Taking Active   warfarin  (COUMADIN) 4 MG tablet [id number] Yes TAKE 1 TABLET BY MOUTH ONCE DAILY AT  6  PM.  TAKE  AS  DIRECTED Wynetta Emery, [name], DO Taking Active   warfarin (COUMADIN) 7.5 MG tablet [id number] Yes Take 7.5 mg by mouth See admin instructions. 4 mg for 2 days, 7.5 mg on the 3rd day, and repeat [provider] Taking Active Care Giver           Assessment:   Goals Addressed            This Visit's Progress     Patient Stated   . PharmD "We are having a hard time affording his medications" (  pt-stated)       Current Barriers:  . Financial Barriers: patient has Medicare A/B and a Prescription insurance on file, though caregiver reports no insurance plan. She reports that they have been unable to afford Spiriva, Advair, or albuterol at this time.  . She also notes that medical bills have become expensive as he is preparing for aortic replacement surgery. She notes that she has a debt at [facility] after having to pick up Lovenox for bridging.  . Reports that they are working to apply for Medicaid, but that they continue to have problems with the application. She is unsure what to do.   Pharmacist Clinical Goal(s):  Marland Kitchen Over the next 30 days, patient will work with PharmD and providers to relieve medication access concerns  Interventions: . Collaborate w/ LCSW to support w/ Medicaid application process . [place] referral for other community resource/financial help . Will collaborate w/ RN CM for care management support  . Patient's caregiver reported that he did not have drug coverage, however, confirmed w/ [employer] that he does have a Part D plan. Patient may qualify for prescription assistance programs for inhalers. Attempted to call caregiver [name] back to discuss current out of pocket spend total; left message for her to return my call at her convenience. Will also plan to discuss total household income and if patient would qualify for Medicare Extra Help.   Patient Self Care  Activities:  . Patient will provide necessary portions of application   Initial goal documentation        Plan: - Will await call back from Roberto [name], and will collaborate w/ CCM team for support  Catie Darnelle Maffucci, PharmD Clinical Pharmacist [place] [phone]

## 2019-04-10 ENCOUNTER — Encounter (HOSPITAL_COMMUNITY): Payer: Medicare Other

## 2019-04-10 ENCOUNTER — Ambulatory Visit: Payer: Self-pay | Admitting: Pharmacist

## 2019-04-10 DIAGNOSIS — J449 Chronic obstructive pulmonary disease, unspecified: Secondary | ICD-10-CM

## 2019-04-10 NOTE — Patient Instructions (Signed)
Visit Information  Goals Addressed            This Visit's Progress     Patient Stated   . PharmD "We are having a hard time affording his medications" (pt-stated)       Current Barriers:  . Financial Barriers: patient has Medicare A/B + Part D plan, though cannot afford brand name medications. Has no inhaler therapy  . Working on [employer] application. Has never heard of Medicare Extra Help  Pharmacist Clinical Goal(s):  Marland Kitchen Over the next 30 days, patient will work with PharmD and providers to relieve medication access concerns  Interventions: . Reviewed social security income; patient may qualify for Medicare Extra Help. Along w/ caregiver, collaborated to complete Extra Help application on patient's behalf. They are aware to watch for a decision in the mail in the next 3-6 weeks.  . In the meantime, will apply for patient assistance. Patient meets income criteria and out of pocket spend criteria for Spiriva ([employer]) and Chief of Staff ([place]). Discussed need for proof of income. [name] is unsure if patient would have 2020 awards letter or 2019 1099 form, and his social security income is deposited on a pay card. She is unsure how to get proof of this, but will work on it this weekend.   Patient Self Care Activities:  . Patient will provide necessary portions of application   Please see past updates related to this goal by clicking on the "Past Updates" button in the selected goal         The patient verbalized understanding of instructions provided today and declined a print copy of patient instruction materials.   Plan:  - Will collaborate w/ patient and provider on patient assistance applications   Catie Darnelle Maffucci, PharmD Clinical Pharmacist [place] [phone]

## 2019-04-10 NOTE — Chronic Care Management (AMB) (Signed)
Chronic Care Management   Follow Up Note   04/10/2019 Name: Roberto [name] MRN: [id number] DOB: [birth year]  Referred by: Valerie Roys, DO Reason for referral : Chronic Care Management (Medication Management )   Roberto [name] is a 63 y.o. year old male who is a primary care patient of Valerie Roys, DO. The CCM team was consulted for assistance with chronic disease management and care coordination needs.    Received call back from [place] today.   Review of patient status, including review of consultants reports, relevant laboratory and other test results, and collaboration with appropriate care team members and the patient's provider was performed as part of comprehensive patient evaluation and provision of chronic care management services.    SDOH (Social Determinants of Health) screening performed today: Financial Strain . See Care Plan for related entries.   Advanced Directives Status: N See Care Plan and [place] application for related entries.  Outpatient Encounter Medications as of 04/10/2019  Medication Sig  . albuterol (PROVENTIL HFA;VENTOLIN HFA) 108 (90 Base) MCG/ACT inhaler Inhale 2 puffs into the lungs every 6 (six) hours as needed for wheezing or shortness of breath.  Marland Kitchen albuterol (PROVENTIL) (2.5 MG/3ML) 0.083% nebulizer solution Take 3 mLs (2.5 mg total) by nebulization every 6 (six) hours as needed for wheezing or shortness of breath.  . allopurinol (ZYLOPRIM) 100 MG tablet Take 1 tablet by mouth once daily  . aspirin 81 MG chewable tablet Chew 81 mg by mouth daily.    Marland Kitchen azithromycin (ZITHROMAX) 250 MG tablet Take 2 tabs day one, then 1 tab daily until complete  . buPROPion (WELLBUTRIN XL) 150 MG 24 hr tablet Take 1 tablet (150 mg total) by mouth daily for 3 days, THEN 1 tablet (150 mg total) 2 (two) times daily.  . cyclobenzaprine (FLEXERIL) 10 MG tablet Take 1 tablet (10 mg total) by mouth 3 times/day as needed-between meals & bedtime for muscle spasms.  .  Fluticasone-Salmeterol (ADVAIR) 250-50 MCG/DOSE AEPB Inhale 1 puff into the lungs as needed.  . furosemide (LASIX) 40 MG tablet Take 1.5 tablets (60 mg total) by mouth 2 (two) times daily.  Marland Kitchen lisinopril (ZESTRIL) 5 MG tablet Take 1 tablet by mouth once daily  . metoprolol succinate (TOPROL-XL) 100 MG 24 hr tablet TAKE 1 TABLET BY MOUTH ONCE DAILY WITH  OR  IMMEDIATELY  FOLLOWING  A  MEAL  . nicotine (NICODERM CQ - DOSED IN MG/24 HR) 7 mg/24hr patch Place 1 patch (7 mg total) onto the skin daily. (Patient not taking: Reported on 04/08/2019)  . nortriptyline (PAMELOR) 10 MG capsule Take 1 capsule by mouth at bedtime  . Omega-3 Fatty Acids (FISH OIL) 1000 MG CPDR Take 2,000 mg by mouth daily.  . potassium chloride SA (K-DUR) 20 MEQ tablet Take 1 tablet (20 mEq total) by mouth daily.  . predniSONE (DELTASONE) 20 MG tablet Take 2 tablets (40 mg total) by mouth daily with breakfast.  . simvastatin (ZOCOR) 40 MG tablet Take 1 tablet (40 mg total) by mouth at bedtime.  Marland Kitchen SPIRIVA HANDIHALER 18 MCG inhalation capsule PLACE ONE CAPSULE INTO INHALER AND INHALE THE CONTENTS BY MOUTH ONCE DAILY  . spironolactone (ALDACTONE) 25 MG tablet Take 1 tablet (25 mg total) by mouth daily.  Marland Kitchen warfarin (COUMADIN) 4 MG tablet TAKE 1 TABLET BY MOUTH ONCE DAILY AT  6  PM.  TAKE  AS  DIRECTED  . warfarin (COUMADIN) 7.5 MG tablet Take 7.5 mg by mouth See admin instructions. 4 mg for  2 days, 7.5 mg on the 3rd day, and repeat   No facility-administered encounter medications on file as of 04/10/2019.      Goals Addressed            This Visit's Progress     Patient Stated   . PharmD "We are having a hard time affording his medications" (pt-stated)       Current Barriers:  . Financial Barriers: patient has Medicare A/B + Part D plan, though cannot afford brand name medications. Has no inhaler therapy  . Working on [employer] application. Has never heard of Medicare Extra Help  Pharmacist Clinical Goal(s):  Marland Kitchen Over the next  30 days, patient will work with PharmD and providers to relieve medication access concerns  Interventions: . Reviewed social security income; patient may qualify for Medicare Extra Help. Along w/ caregiver, collaborated to complete Extra Help application on patient's behalf. They are aware to watch for a decision in the mail in the next 3-6 weeks.  . In the meantime, will apply for patient assistance. Patient meets income criteria and out of pocket spend criteria for Spiriva ([employer]) and Chief of Staff ([place]). Discussed need for proof of income. [name] is unsure if patient would have 2020 awards letter or 2019 1099 form, and his social security income is deposited on a pay card. She is unsure how to get proof of this, but will work on it this weekend.   Patient Self Care Activities:  . Patient will provide necessary portions of application   Please see past updates related to this goal by clicking on the "Past Updates" button in the selected goal          Plan:  - Will collaborate w/ patient and provider on patient assistance applications   Catie Darnelle Maffucci, PharmD Clinical Pharmacist [place] [phone]

## 2019-04-11 NOTE — Assessment & Plan Note (Signed)
Will tx with zpak, prednisone, and continued inhaler regimen. F/u in person if not improving. Supportive care reviewed at length

## 2019-04-15 ENCOUNTER — Other Ambulatory Visit: Payer: Self-pay | Admitting: Family Medicine

## 2019-04-15 MED ORDER — ALBUTEROL SULFATE HFA 108 (90 BASE) MCG/ACT IN AERS: 2.0000 | INHALATION_SPRAY | Freq: Four times a day (QID) | RESPIRATORY_TRACT | 6 refills | Status: DC | PRN

## 2019-04-15 MED ORDER — FLUTICASONE-SALMETEROL 250-50 MCG/DOSE IN AEPB: 1.0000 | INHALATION_SPRAY | RESPIRATORY_TRACT | 6 refills | Status: DC | PRN

## 2019-04-20 ENCOUNTER — Encounter (INDEPENDENT_AMBULATORY_CARE_PROVIDER_SITE_OTHER): Payer: Medicare Other | Admitting: Cardiology

## 2019-04-20 DIAGNOSIS — G4733 Obstructive sleep apnea (adult) (pediatric): Secondary | ICD-10-CM | POA: Diagnosis not present

## 2019-04-21 LAB — POCT INR

## 2019-04-22 NOTE — Procedures (Signed)
   Sleep Study Report  Patient Information Name: Roberto [name] ID: [id number] Birth Date: [birth year]  Age: 63  Gender: Male Study Date:04/20/2019 Referring Physician: Loralie Champagne, MD  Summary & Diagnosis  TEST DESCRIPTION: Home sleep apnea testing was completed using the WatchPat, a Type 1 device, utilizing peripheral arterial tonometry (PAT), chest movement, actigraphy, pulse oximetry, pulse rate, body position and snore. AHI was calculated with apnea and hypopnea using valid sleep time as the denominator. RDI includes apneashypopneas, and RERAs. The data acquired and the scoring of sleep and all associated events were performed inaccordance with the recommended standards and specifications as outlined in the AASM Manual for the Scoring of Sleep and Associated Events 2.2.0 (2015).  FINDINGS: 1. Moderate Obstructive Sleep Apnea (G47.33) with AHI 19.6/hr. 2. Mild Oxygen desaturations as low as 83%. Time spent with O2 sats < 88% was 0.3 min. 3. Severe Snoring. 4. Average heart rate 62bpm. 5. Normal sleep onset latency at 30 min. 6. Prolonged REM sleep onset latency at 302 min. 7. Fragmented sleep with 46 awakenings during sleep.  DIAGNOSIS: 1. Moderate Obstructive Sleep Apnea  Recommendations 1. Findings are consistent with moderate OSA. For symptomatic moderate obstructive sleep apnea, patient preference and compliance impacts efficacious outcomes. Therapeutic options include:   a. The patient may benefit from the use of a nocturnal mandibular repositioning appliance. If that line of therapy isto be pursued the patient should be evaluated by a dentist trained in the treatment of sleep related breathing disorders.   b. An ENT consultation which may be useful for specific causes of obstruction and possible treatment options .   c. Consider treatment with nasal continuous positive airway pressure ( CPAP). If the patient chooses CPAP therapy, a nocturnal PSG with CPAP titration is  recommended. As an alternative, an Auto PAP with pressure range 5-20cm H2O with download is an option.  2. Weight loss may be of benefit in reducing the severity of respiratory events and snoring .  3. Routine follow-up efficacy testing should be performed.  Signature: Fransico Him, MD Electronically Signed 04/22/2019

## 2019-04-23 ENCOUNTER — Encounter: Payer: Self-pay | Admitting: Family Medicine

## 2019-04-23 ENCOUNTER — Ambulatory Visit: Payer: Self-pay | Admitting: Pharmacist

## 2019-04-23 ENCOUNTER — Ambulatory Visit (INDEPENDENT_AMBULATORY_CARE_PROVIDER_SITE_OTHER): Payer: Medicare Other | Admitting: Licensed Clinical Social Worker

## 2019-04-23 DIAGNOSIS — J449 Chronic obstructive pulmonary disease, unspecified: Secondary | ICD-10-CM

## 2019-04-23 DIAGNOSIS — I10 Essential (primary) hypertension: Secondary | ICD-10-CM

## 2019-04-23 LAB — POCT INR

## 2019-04-23 NOTE — Chronic Care Management (AMB) (Signed)
Chronic Care Management   Follow Up Note   04/23/2019 Name: Daud Cayer MRN: [id number] DOB: 09-18-1955  Referred by: Valerie Roys, DO Reason for referral : Chronic Care Management (Medication Management)   Raydon Chappuis is a 63 y.o. year old male who is a primary care patient of Valerie Roys, DO. The CCM team was consulted for assistance with chronic disease management and care coordination needs.    Care coordination completed today.   Review of patient status, including review of consultants reports, relevant laboratory and other test results, and collaboration with appropriate care team members and the patient's provider was performed as part of comprehensive patient evaluation and provision of chronic care management services.    SDOH (Social Determinants of Health) screening performed today: Financial Strain . See Care Plan for related entries.   Advanced Directives Status: N See Care Plan and Vynca application for related entries.  Outpatient Encounter Medications as of 04/23/2019  Medication Sig  . albuterol (PROVENTIL) (2.5 MG/3ML) 0.083% nebulizer solution Take 3 mLs (2.5 mg total) by nebulization every 6 (six) hours as needed for wheezing or shortness of breath.  Marland Kitchen albuterol (VENTOLIN HFA) 108 (90 Base) MCG/ACT inhaler Inhale 2 puffs into the lungs every 6 (six) hours as needed for wheezing or shortness of breath.  . allopurinol (ZYLOPRIM) 100 MG tablet Take 1 tablet by mouth once daily  . aspirin 81 MG chewable tablet Chew 81 mg by mouth daily.    Marland Kitchen azithromycin (ZITHROMAX) 250 MG tablet Take 2 tabs day one, then 1 tab daily until complete  . buPROPion (WELLBUTRIN XL) 150 MG 24 hr tablet Take 1 tablet (150 mg total) by mouth daily for 3 days, THEN 1 tablet (150 mg total) 2 (two) times daily.  . cyclobenzaprine (FLEXERIL) 10 MG tablet Take 1 tablet (10 mg total) by mouth 3 times/day as needed-between meals & bedtime for muscle spasms.  . Fluticasone-Salmeterol  (ADVAIR) 250-50 MCG/DOSE AEPB Inhale 1 puff into the lungs as needed.  . furosemide (LASIX) 40 MG tablet Take 1.5 tablets (60 mg total) by mouth 2 (two) times daily.  Marland Kitchen lisinopril (ZESTRIL) 5 MG tablet Take 1 tablet by mouth once daily  . metoprolol succinate (TOPROL-XL) 100 MG 24 hr tablet TAKE 1 TABLET BY MOUTH ONCE DAILY WITH  OR  IMMEDIATELY  FOLLOWING  A  MEAL  . nicotine (NICODERM CQ - DOSED IN MG/24 HR) 7 mg/24hr patch Place 1 patch (7 mg total) onto the skin daily. (Patient not taking: Reported on 04/08/2019)  . nortriptyline (PAMELOR) 10 MG capsule Take 1 capsule by mouth at bedtime  . Omega-3 Fatty Acids (FISH OIL) 1000 MG CPDR Take 2,000 mg by mouth daily.  . potassium chloride SA (K-DUR) 20 MEQ tablet Take 1 tablet (20 mEq total) by mouth daily.  . predniSONE (DELTASONE) 20 MG tablet Take 2 tablets (40 mg total) by mouth daily with breakfast.  . simvastatin (ZOCOR) 40 MG tablet Take 1 tablet (40 mg total) by mouth at bedtime.  Marland Kitchen SPIRIVA HANDIHALER 18 MCG inhalation capsule PLACE ONE CAPSULE INTO INHALER AND INHALE THE CONTENTS BY MOUTH ONCE DAILY  . spironolactone (ALDACTONE) 25 MG tablet Take 1 tablet (25 mg total) by mouth daily.  Marland Kitchen warfarin (COUMADIN) 4 MG tablet TAKE 1 TABLET BY MOUTH ONCE DAILY AT  6  PM.  TAKE  AS  DIRECTED  . warfarin (COUMADIN) 7.5 MG tablet Take 7.5 mg by mouth See admin instructions. 4 mg for 2 days, 7.5 mg  on the 3rd day, and repeat   No facility-administered encounter medications on file as of 04/23/2019.      Goals Addressed            This Visit's Progress     Patient Stated   . PharmD "We are having a hard time affording his medications" (pt-stated)       Current Barriers:  . Financial Barriers: patient has Medicare A/B + Part D plan, though cannot afford brand name medications. Has no inhaler therapy  . Applied for Medicare Extra Help on 04/10/2019; however, patient has now dropped off SSI awards letter, and he is over income for Extra Help.  .  Dropped off out of pocket spend. Through 04/08/2019, patient has spent $477 in copays; must spend $600 to qualify for assistance through [place] for Advair and Ventolin.  o Would qualify for Dulera/Proventil through [employer]; though this process will take 6-8 weeks  Pharmacist Clinical Goal(s):  Marland Kitchen Over the next 60 days, patient will work with PharmD and providers to relieve medication access concerns  Interventions: . Will continue to collaborate w/ LCSW regarding Medicaid application . Contacted [name]; left message explaining that they had not met OOP spend requirement and that we need to discuss next steps. . Submitted patient assistance application for Spiriva through [employer]. Will pass materials along to [employer], CPhT for follow up.   Patient Self Care Activities:  . Patient will provide necessary portions of application   Please see past updates related to this goal by clicking on the "Past Updates" button in the selected goal          Plan:  - Will collaborate with Sharee Pimple Simcox, CPhT on medication access needs.  - Will outreach patient in 2-3 weeks for continued medication management support  Catie Darnelle Maffucci, PharmD Clinical Pharmacist [place] [phone]

## 2019-04-23 NOTE — Chronic Care Management (AMB) (Signed)
Chronic Care Management    Clinical Social Work Follow Up Note  04/23/2019 Name: Roberto [name] MRN: [id number] DOB: [birth year]  Roberto [name] is a 63 y.o. year old male who is a primary care patient of Valerie Roys, DO. The CCM team was consulted for assistance with [employer] .   Review of patient status, including review of consultants reports, other relevant assessments, and collaboration with appropriate care team members and the patient's provider was performed as part of comprehensive patient evaluation and provision of chronic care management services.    SDOH (Social Determinants of Health) screening performed today: Financial Strain  Stress. See Care Plan for related entries.   Advanced Directives Status: <no information> See Care Plan for related entries.   Outpatient Encounter Medications as of 04/23/2019  Medication Sig  . albuterol (PROVENTIL) (2.5 MG/3ML) 0.083% nebulizer solution Take 3 mLs (2.5 mg total) by nebulization every 6 (six) hours as needed for wheezing or shortness of breath.  Marland Kitchen albuterol (VENTOLIN HFA) 108 (90 Base) MCG/ACT inhaler Inhale 2 puffs into the lungs every 6 (six) hours as needed for wheezing or shortness of breath.  . allopurinol (ZYLOPRIM) 100 MG tablet Take 1 tablet by mouth once daily  . aspirin 81 MG chewable tablet Chew 81 mg by mouth daily.    Marland Kitchen azithromycin (ZITHROMAX) 250 MG tablet Take 2 tabs day one, then 1 tab daily until complete  . buPROPion (WELLBUTRIN XL) 150 MG 24 hr tablet Take 1 tablet (150 mg total) by mouth daily for 3 days, THEN 1 tablet (150 mg total) 2 (two) times daily.  . cyclobenzaprine (FLEXERIL) 10 MG tablet Take 1 tablet (10 mg total) by mouth 3 times/day as needed-between meals & bedtime for muscle spasms.  . Fluticasone-Salmeterol (ADVAIR) 250-50 MCG/DOSE AEPB Inhale 1 puff into the lungs as needed.  . furosemide (LASIX) 40 MG tablet Take 1.5 tablets (60 mg total) by mouth 2 (two) times daily.  Marland Kitchen  lisinopril (ZESTRIL) 5 MG tablet Take 1 tablet by mouth once daily  . metoprolol succinate (TOPROL-XL) 100 MG 24 hr tablet TAKE 1 TABLET BY MOUTH ONCE DAILY WITH  OR  IMMEDIATELY  FOLLOWING  A  MEAL  . nicotine (NICODERM CQ - DOSED IN MG/24 HR) 7 mg/24hr patch Place 1 patch (7 mg total) onto the skin daily. (Patient not taking: Reported on 04/08/2019)  . nortriptyline (PAMELOR) 10 MG capsule Take 1 capsule by mouth at bedtime  . Omega-3 Fatty Acids (FISH OIL) 1000 MG CPDR Take 2,000 mg by mouth daily.  . potassium chloride SA (K-DUR) 20 MEQ tablet Take 1 tablet (20 mEq total) by mouth daily.  . predniSONE (DELTASONE) 20 MG tablet Take 2 tablets (40 mg total) by mouth daily with breakfast.  . simvastatin (ZOCOR) 40 MG tablet Take 1 tablet (40 mg total) by mouth at bedtime.  Marland Kitchen SPIRIVA HANDIHALER 18 MCG inhalation capsule PLACE ONE CAPSULE INTO INHALER AND INHALE THE CONTENTS BY MOUTH ONCE DAILY  . spironolactone (ALDACTONE) 25 MG tablet Take 1 tablet (25 mg total) by mouth daily.  Marland Kitchen warfarin (COUMADIN) 4 MG tablet TAKE 1 TABLET BY MOUTH ONCE DAILY AT  6  PM.  TAKE  AS  DIRECTED  . warfarin (COUMADIN) 7.5 MG tablet Take 7.5 mg by mouth See admin instructions. 4 mg for 2 days, 7.5 mg on the 3rd day, and repeat   No facility-administered encounter medications on file as of 04/23/2019.      Goals Addressed    .  SW-'We need to get [place] approved for Medicaid" (pt-stated)       Current Barriers:  . Financial constraints related to managing health care expenses . Limited social support . Level of care concerns . ADL IADL limitations . Social Isolation . Limited access to caregiver  Clinical Social Work Clinical Goal(s):  Marland Kitchen Over the next 120 days, patient will work with SW to address concerns related to gaining Adult Medicaid for patient in order to alleviate financial strain  Interventions: . Patient interviewed and appropriate assessments performed . Provided patient with information about  Medicaid enrollment and strict enrollment guidelines  . Discussed plans with patient for ongoing care management follow up and provided patient with direct contact information for care management team . Advised patient to work on gathering all of pt's medical bills in order to get him approved for Medicaid. Patient has been trying to apply for Medicaid since February of [id number] and his application was lost. Patient completed an additional application in August of 2020 but was denied services because patient did not reach the Medical Bill amount needed to get approved for Medicaid. Friend reports that several of these medical bills were lost when his first Medicaid application went missing in February of this year.  Marland Kitchen LCSW left voice message with Medicaid casework [name] at [phone].  Marland Kitchen LCSW updated CCM Pharmacist and provided this contact information as well ^. . Assisted patient/caregiver with obtaining information about health plan benefits . Provided education and assistance to client regarding Advanced Directives. . Provided education to patient/caregiver regarding level of care options.  Patient Self Care Activities:  . Attends all scheduled provider appointments . Unable to independently afford medical expenses and was declined Medicaid  Initial goal documentation     Follow Up Plan: SW will follow up with patient by phone over the next 45 days   [name], [place], MSW, [email or web address] Phone: [phone]

## 2019-04-23 NOTE — Patient Instructions (Signed)
Visit Information  Goals Addressed            This Visit's Progress     Patient Stated   . PharmD "We are having a hard time affording his medications" (pt-stated)       Current Barriers:  . Financial Barriers: patient has Medicare A/B + Part D plan, though cannot afford brand name medications. Has no inhaler therapy  . Applied for Medicare Extra Help on 04/10/2019; however, patient has now dropped off SSI awards letter, and he is over income for Extra Help.  . Dropped off out of pocket spend. Through 04/08/2019, patient has spent $477 in copays; must spend $600 to qualify for assistance through [place] for Advair and Ventolin.  o Would qualify for Dulera/Proventil through [employer]; though this process will take 6-8 weeks  Pharmacist Clinical Goal(s):  Marland Kitchen Over the next 60 days, patient will work with PharmD and providers to relieve medication access concerns  Interventions: . Will continue to collaborate w/ LCSW regarding Medicaid application . Contacted [name]; left message explaining that they had not met OOP spend requirement and that we need to discuss next steps. . Submitted patient assistance application for Spiriva through [employer]. Will pass materials along to [employer], CPhT for follow up.   Patient Self Care Activities:  . Patient will provide necessary portions of application   Please see past updates related to this goal by clicking on the "Past Updates" button in the selected goal         The patient verbalized understanding of instructions provided today and declined a print copy of patient instruction materials.   Plan:  - Will collaborate with Susy Frizzle, CPhT on medication access needs.  - Will outreach patient in 2-3 weeks for continued medication management support  Catie Darnelle Maffucci, PharmD Clinical Pharmacist [place] [phone]

## 2019-04-24 ENCOUNTER — Telehealth: Payer: Self-pay | Admitting: *Deleted

## 2019-04-24 NOTE — Telephone Encounter (Signed)
-----   Message from Sueanne Margarita, MD sent at 04/22/2019  8:06 PM EDT ----- Please let patient know that they have sleep apnea and recommend auto CPAP titration through [facility].  Orders have been placed in Epic. Please set 10 week OV with me.

## 2019-04-24 NOTE — Telephone Encounter (Addendum)
Informed patient/Roberto [name] (friend) of sleep study results and patient understanding was verbalized. Patient/Roberto [name] (friend) understands his sleep study showed  they have sleep apnea and recommend auto CPAP titration through [facility]. Orders have been placed in Epic. Please set 10 week OV with me.   cpap titration sent to [facility] via electronic fax

## 2019-04-28 ENCOUNTER — Other Ambulatory Visit: Payer: Self-pay

## 2019-04-28 ENCOUNTER — Ambulatory Visit: Payer: Medicare Other

## 2019-04-28 DIAGNOSIS — I5022 Chronic systolic (congestive) heart failure: Secondary | ICD-10-CM

## 2019-04-28 NOTE — Telephone Encounter (Signed)
Confirmation of fax received by Sam.

## 2019-04-29 LAB — POCT INR

## 2019-05-02 ENCOUNTER — Ambulatory Visit: Payer: Self-pay | Admitting: Pharmacist

## 2019-05-02 DIAGNOSIS — J449 Chronic obstructive pulmonary disease, unspecified: Secondary | ICD-10-CM

## 2019-05-02 NOTE — Patient Instructions (Signed)
Visit Information  Goals Addressed            This Visit's Progress     Patient Stated   . PharmD "We are having a hard time affording his medications" (pt-stated)       Current Barriers:  . Financial Barriers: patient has Medicare A/B + Part D plan, though cannot afford brand name medications. Has no inhaler therapy  . Applied for Medicare Extra Help on 04/10/2019; however, patient has now dropped off SSI awards letter, and he is over income for Extra Help.  . Dropped off out of pocket spend. Through 04/08/2019, patient has spent $477 in copays; must spend $600 to qualify for assistance through [place] for Advair and Ventolin.  o Would qualify for Dulera/Proventil through [employer]; though this process will take 6-8 weeks o Applied for Spiriva through [place]):  Marland Kitchen Over the next 60 days, patient will work with PharmD and providers to relieve medication access concerns  Interventions: . [place]; patient has been APPROVED for Spiriva assistance for a temporary 90 day supply; they will need proof of denial for Medicare Extra Help for continued assistance (though will need to reapply for 2021). 90 day supply should arrive to patient's home on Sunday, November 1.  . Attempted to contact [name] to discuss needs moving forward for medication access. Left HIPAA compliant message for her to return my call at her convenience.   Patient Self Care Activities:  . Patient will provide necessary portions of application   Please see past updates related to this goal by clicking on the "Past Updates" button in the selected goal         The patient verbalized understanding of instructions provided today and declined a print copy of patient instruction materials.   Plan: - Will await phone call back from [facility] regarding medication access needs.  - If I do not hear back, will outreach in another 6-8 weeks for continued medication management  assistance  Catie Darnelle Maffucci, PharmD Clinical Pharmacist [place] [phone]

## 2019-05-02 NOTE — Chronic Care Management (AMB) (Signed)
Chronic Care Management   Follow Up Note   05/02/2019 Name: Roberto [name] MRN: [id number] DOB: [birth year]  Referred by: Valerie Roys, DO Reason for referral : Chronic Care Management (Medication Management)   Roberto [name] is a 63 y.o. year old male who is a primary care patient of Valerie Roys, DO. The CCM team was consulted for assistance with chronic disease management and care coordination needs.    Care coordination completed today.  Review of patient status, including review of consultants reports, relevant laboratory and other test results, and collaboration with appropriate care team members and the patient's provider was performed as part of comprehensive patient evaluation and provision of chronic care management services.    SDOH (Social Determinants of Health) screening performed today: Financial Strain . See Care Plan for related entries.   Advanced Directives Status: N See Care Plan and Vynca application for related entries.  Outpatient Encounter Medications as of 05/02/2019  Medication Sig  . albuterol (PROVENTIL) (2.5 MG/3ML) 0.083% nebulizer solution Take 3 mLs (2.5 mg total) by nebulization every 6 (six) hours as needed for wheezing or shortness of breath.  Marland Kitchen albuterol (VENTOLIN HFA) 108 (90 Base) MCG/ACT inhaler Inhale 2 puffs into the lungs every 6 (six) hours as needed for wheezing or shortness of breath.  . allopurinol (ZYLOPRIM) 100 MG tablet Take 1 tablet by mouth once daily  . aspirin 81 MG chewable tablet Chew 81 mg by mouth daily.    Marland Kitchen azithromycin (ZITHROMAX) 250 MG tablet Take 2 tabs day one, then 1 tab daily until complete  . buPROPion (WELLBUTRIN XL) 150 MG 24 hr tablet Take 1 tablet (150 mg total) by mouth daily for 3 days, THEN 1 tablet (150 mg total) 2 (two) times daily.  . cyclobenzaprine (FLEXERIL) 10 MG tablet Take 1 tablet (10 mg total) by mouth 3 times/day as needed-between meals & bedtime for muscle spasms.  . Fluticasone-Salmeterol  (ADVAIR) 250-50 MCG/DOSE AEPB Inhale 1 puff into the lungs as needed.  . furosemide (LASIX) 40 MG tablet Take 1.5 tablets (60 mg total) by mouth 2 (two) times daily.  Marland Kitchen lisinopril (ZESTRIL) 5 MG tablet Take 1 tablet by mouth once daily  . metoprolol succinate (TOPROL-XL) 100 MG 24 hr tablet TAKE 1 TABLET BY MOUTH ONCE DAILY WITH  OR  IMMEDIATELY  FOLLOWING  A  MEAL  . nicotine (NICODERM CQ - DOSED IN MG/24 HR) 7 mg/24hr patch Place 1 patch (7 mg total) onto the skin daily. (Patient not taking: Reported on 04/08/2019)  . nortriptyline (PAMELOR) 10 MG capsule Take 1 capsule by mouth at bedtime  . Omega-3 Fatty Acids (FISH OIL) 1000 MG CPDR Take 2,000 mg by mouth daily.  . potassium chloride SA (K-DUR) 20 MEQ tablet Take 1 tablet (20 mEq total) by mouth daily.  . predniSONE (DELTASONE) 20 MG tablet Take 2 tablets (40 mg total) by mouth daily with breakfast.  . simvastatin (ZOCOR) 40 MG tablet Take 1 tablet (40 mg total) by mouth at bedtime.  Marland Kitchen SPIRIVA HANDIHALER 18 MCG inhalation capsule PLACE ONE CAPSULE INTO INHALER AND INHALE THE CONTENTS BY MOUTH ONCE DAILY  . spironolactone (ALDACTONE) 25 MG tablet Take 1 tablet (25 mg total) by mouth daily.  Marland Kitchen warfarin (COUMADIN) 4 MG tablet TAKE 1 TABLET BY MOUTH ONCE DAILY AT  6  PM.  TAKE  AS  DIRECTED  . warfarin (COUMADIN) 7.5 MG tablet Take 7.5 mg by mouth See admin instructions. 4 mg for 2 days, 7.5 mg on  the 3rd day, and repeat   No facility-administered encounter medications on file as of 05/02/2019.      Goals Addressed            This Visit's Progress     Patient Stated   . PharmD "We are having a hard time affording his medications" (pt-stated)       Current Barriers:  . Financial Barriers: patient has Medicare A/B + Part D plan, though cannot afford brand name medications. Has no inhaler therapy  . Applied for Medicare Extra Help on 04/10/2019; however, patient has now dropped off SSI awards letter, and he is over income for Extra Help.  .  Dropped off out of pocket spend. Through 04/08/2019, patient has spent $477 in copays; must spend $600 to qualify for assistance through [place] for Advair and Ventolin.  o Would qualify for Dulera/Proventil through [employer]; though this process will take 6-8 weeks o Applied for Spiriva through [place]):  Marland Kitchen Over the next 60 days, patient will work with PharmD and providers to relieve medication access concerns  Interventions: . [place]; patient has been APPROVED for Spiriva assistance for a temporary 90 day supply; they will need proof of denial for Medicare Extra Help for continued assistance (though will need to reapply for 2021). 90 day supply should arrive to patient's home on Sunday, November 1.  . Attempted to contact [name] to discuss needs moving forward for medication access. Left HIPAA compliant message for her to return my call at her convenience.   Patient Self Care Activities:  . Patient will provide necessary portions of application   Please see past updates related to this goal by clicking on the "Past Updates" button in the selected goal          Plan: - Will await phone call back from [facility] regarding medication access needs.  - If I do not hear back, will outreach in another 6-8 weeks for continued medication management assistance  Catie Darnelle Maffucci, PharmD Clinical Pharmacist [place] [phone]

## 2019-05-05 ENCOUNTER — Telehealth (HOSPITAL_COMMUNITY): Payer: Self-pay

## 2019-05-05 NOTE — Telephone Encounter (Signed)
Sleep Study was canceled per [facility]  because unable to contact the patient as of 05/02/2019.

## 2019-05-06 ENCOUNTER — Ambulatory Visit (HOSPITAL_COMMUNITY)
Admission: RE | Admit: 2019-05-06 | Discharge: 2019-05-06 | Disposition: A | Discharge status: Home / Self Care | Payer: Medicare Other | Source: Ambulatory Visit | Attending: Cardiology | Admitting: Cardiology

## 2019-05-06 ENCOUNTER — Other Ambulatory Visit: Payer: Self-pay

## 2019-05-06 ENCOUNTER — Encounter (HOSPITAL_COMMUNITY): Payer: Self-pay | Admitting: Cardiology

## 2019-05-06 VITALS — BP 93/62 | HR 81 | Wt 339.2 lb

## 2019-05-06 DIAGNOSIS — I7121 Aneurysm of the ascending aorta, without rupture: Secondary | ICD-10-CM

## 2019-05-06 DIAGNOSIS — Z8673 Personal history of transient ischemic attack (TIA), and cerebral infarction without residual deficits: Secondary | ICD-10-CM | POA: Insufficient documentation

## 2019-05-06 DIAGNOSIS — Z79899 Other long term (current) drug therapy: Secondary | ICD-10-CM | POA: Diagnosis not present

## 2019-05-06 DIAGNOSIS — E785 Hyperlipidemia, unspecified: Secondary | ICD-10-CM | POA: Diagnosis not present

## 2019-05-06 DIAGNOSIS — G4733 Obstructive sleep apnea (adult) (pediatric): Secondary | ICD-10-CM | POA: Diagnosis not present

## 2019-05-06 DIAGNOSIS — I5022 Chronic systolic (congestive) heart failure: Secondary | ICD-10-CM | POA: Insufficient documentation

## 2019-05-06 DIAGNOSIS — Z833 Family history of diabetes mellitus: Secondary | ICD-10-CM | POA: Insufficient documentation

## 2019-05-06 DIAGNOSIS — I712 Thoracic aortic aneurysm, without rupture: Secondary | ICD-10-CM | POA: Diagnosis not present

## 2019-05-06 DIAGNOSIS — Z803 Family history of malignant neoplasm of breast: Secondary | ICD-10-CM | POA: Insufficient documentation

## 2019-05-06 DIAGNOSIS — Z8261 Family history of arthritis: Secondary | ICD-10-CM | POA: Diagnosis not present

## 2019-05-06 DIAGNOSIS — F17210 Nicotine dependence, cigarettes, uncomplicated: Secondary | ICD-10-CM | POA: Insufficient documentation

## 2019-05-06 DIAGNOSIS — Z952 Presence of prosthetic heart valve: Secondary | ICD-10-CM | POA: Insufficient documentation

## 2019-05-06 DIAGNOSIS — Z8249 Family history of ischemic heart disease and other diseases of the circulatory system: Secondary | ICD-10-CM | POA: Insufficient documentation

## 2019-05-06 DIAGNOSIS — Z7901 Long term (current) use of anticoagulants: Secondary | ICD-10-CM | POA: Diagnosis not present

## 2019-05-06 DIAGNOSIS — I428 Other cardiomyopathies: Secondary | ICD-10-CM | POA: Diagnosis not present

## 2019-05-06 DIAGNOSIS — Z7982 Long term (current) use of aspirin: Secondary | ICD-10-CM | POA: Diagnosis not present

## 2019-05-06 DIAGNOSIS — I2510 Atherosclerotic heart disease of native coronary artery without angina pectoris: Secondary | ICD-10-CM | POA: Diagnosis not present

## 2019-05-06 DIAGNOSIS — Z823 Family history of stroke: Secondary | ICD-10-CM | POA: Diagnosis not present

## 2019-05-06 DIAGNOSIS — Z800 Family history of malignant neoplasm of digestive organs: Secondary | ICD-10-CM | POA: Insufficient documentation

## 2019-05-06 LAB — BASIC METABOLIC PANEL
Anion gap: 11 (ref 5–15)
BUN: 23 mg/dL (ref 8–23)
CO2: 20 mmol/L — ABNORMAL LOW (ref 22–32)
Calcium: 9.2 mg/dL (ref 8.9–10.3)
Chloride: 106 mmol/L (ref 98–111)
Creatinine, Ser: 1.3 mg/dL — ABNORMAL HIGH (ref 0.61–1.24)
GFR calc Af Amer: >60 mL/min (ref >60)
GFR calc non Af Amer: 58 mL/min — ABNORMAL LOW (ref >60)
Glucose, Bld: 110 mg/dL — ABNORMAL HIGH (ref 70–99)
Potassium: 4.6 mmol/L (ref 3.5–5.1)
Sodium: 137 mmol/L (ref 135–145)

## 2019-05-06 LAB — PROTIME-INR
INR: 3 — ABNORMAL HIGH (ref 0.8–1.2)
Prothrombin Time: 30.8 seconds — ABNORMAL HIGH (ref 11.4–15.2)

## 2019-05-06 MED ORDER — ROSUVASTATIN CALCIUM 20 MG PO TABS: 20.0000 mg | ORAL_TABLET | Freq: Every day | ORAL | 3 refills | Status: DC

## 2019-05-06 NOTE — Progress Notes (Signed)
PCP: Valerie Roys, DO HF Cardiology: Dr. Aundra Dubin  63 y.o. with history of bicuspid aortic valve disorder s/p mechanical AVR in 9/11 and 5.5 cm ascending aortic aneurysm on most recent CT in [id number], chronic systolic CHF, and smoking was referred by Dr. Orvan Seen for optimization of CHF prior to ascending aorta replacement surgery.  Most recent echo in 7/20 showed EF 40-45% with stable mechanical aortic valve.  RHC/LHC in 7/20 showed nonobstructive CAD and R>L sided failure.    Patient returns today for followup of CHF.  He is currently taking Lasix 40 mg bid.  Weight is down 8 lbs.  He is still smoking 1/2 ppd despite Wellbutrin use. PFTs in 9/20 showed minimal obstruction, restriction was present likely due to body habitus.  He has less LE edema.  He is short of breath walking up hills/inclines.  No orthopnea/PND. He has been diagnosed with OSA and is waiting for CPAP. No chest pain.  No palpitations. BP runs low, occasional lightheadedness with standing.   Labs (6/20): LDL 94 Labs (7/20): K 4.4, creatinine 1.13 Labs (8/20): K 4.2, creatinine 1.41 => 1.18 Labs (9/20): K 4.3, creatinine 1.21  ECG: NSR, PVCs, lateral TWIs PMH: 1. Bicuspid aortic valve: s/p mechanical AVR 9/11 2. H/o CVA 3. Hyperlipidemia 4. H/o renal infarct 5. Active smoker: Could not tolerate Chantix 6. Chronic systolic CHF: Nonischemic cardiomyopathy.  - Echo (7/20): EF 40-45%, normal RV, mechanical aortic valve with mean gradient 15 mmHg - LHC/RHC (7/20): 60% stenosis D1; mean RA 22, RV 45/26 mean 36, mean PCWP 23, CI 2.4 7. Ascending aortic aneurysm: Suspect related to bicuspid aortic valve.  - CTA (7/20) with 5.5 cm ascending aorta.  8. OSA: moderate.   Social History   Socioeconomic History  . Marital status: Single    Spouse name: Not on file  . Number of children: 1  . Years of education: 4  . Highest education level: Associate degree: academic program  Occupational History  . Occupation: Disabled   Fish farm manager: UNEMPLOYED  Social Needs  . Financial resource strain: Not very hard  . Food insecurity    Worry: Never true    Inability: Never true  . Transportation needs    Medical: No    Non-medical: No  Tobacco Use  . Smoking status: Current Every Day Smoker    Packs/day: 0.50    Years: 46.00    Pack years: 23.00    Types: Cigarettes    Start date: 07/16/2017  . Smokeless tobacco: Never Used  . Tobacco comment: 10 cigarettes a day  Substance and Sexual Activity  . Alcohol use: No    Alcohol/week: 0.0 standard drinks  . Drug use: No  . Sexual activity: Not Currently  Lifestyle  . Physical activity    Days per week: 0 days    Minutes per session: 0 min  . Stress: Not at all  Relationships  . Social Herbalist on phone: Never    Gets together: Never    Attends religious service: Never    Active member of club or organization: No    Attends meetings of clubs or organizations: Never    Relationship status: Married  . Intimate partner violence    Fear of current or ex partner: No    Emotionally abused: No    Physically abused: No    Forced sexual activity: No  Other Topics Concern  . Not on file  Social History Narrative  . Not on file  Family History  Problem Relation Age of Onset  . Arthritis Mother   . Dementia Mother   . Colon cancer Mother   . Arthritis Father   . Diabetes Father   . Stroke Father   . Colon cancer Father   . Heart attack Brother   . Breast cancer Sister   . Seizures Sister   . Cancer Brother        brain  . Heart disease Brother   . Heart attack Brother    ROS: All systems reviewed and negative except as per HPI.  Current Outpatient Medications  Medication Sig Dispense Refill  . albuterol (PROVENTIL) (2.5 MG/3ML) 0.083% nebulizer solution Take 3 mLs (2.5 mg total) by nebulization every 6 (six) hours as needed for wheezing or shortness of breath. 150 mL 1  . albuterol (VENTOLIN HFA) 108 (90 Base) MCG/ACT inhaler Inhale 2  puffs into the lungs every 6 (six) hours as needed for wheezing or shortness of breath. 19 g 6  . allopurinol (ZYLOPRIM) 100 MG tablet Take 1 tablet by mouth once daily 90 tablet 0  . aspirin 81 MG chewable tablet Chew 81 mg by mouth daily.      Marland Kitchen buPROPion (WELLBUTRIN XL) 150 MG 24 hr tablet Take 1 tablet (150 mg total) by mouth daily for 3 days, THEN 1 tablet (150 mg total) 2 (two) times daily. 180 tablet 3  . cyclobenzaprine (FLEXERIL) 10 MG tablet Take 1 tablet (10 mg total) by mouth 3 times/day as needed-between meals & bedtime for muscle spasms. 180 tablet 0  . Fluticasone-Salmeterol (ADVAIR) 250-50 MCG/DOSE AEPB Inhale 1 puff into the lungs as needed. 60 each 6  . furosemide (LASIX) 40 MG tablet Take 1.5 tablets (60 mg total) by mouth 2 (two) times daily. 45 tablet 3  . lisinopril (ZESTRIL) 5 MG tablet Take 5 mg by mouth 2 (two) times daily.    . metoprolol succinate (TOPROL-XL) 100 MG 24 hr tablet TAKE 1 TABLET BY MOUTH ONCE DAILY WITH  OR  IMMEDIATELY  FOLLOWING  A  MEAL 90 tablet 3  . nortriptyline (PAMELOR) 10 MG capsule Take 1 capsule by mouth at bedtime 90 capsule 0  . Omega-3 Fatty Acids (FISH OIL) 1000 MG CPDR Take 2,000 mg by mouth daily. 180 capsule 2  . potassium chloride SA (K-DUR) 20 MEQ tablet Take 1 tablet (20 mEq total) by mouth daily. 90 tablet 3  . SPIRIVA HANDIHALER 18 MCG inhalation capsule PLACE ONE CAPSULE INTO INHALER AND INHALE THE CONTENTS BY MOUTH ONCE DAILY 30 capsule 0  . spironolactone (ALDACTONE) 25 MG tablet Take 1 tablet (25 mg total) by mouth daily. 90 tablet 3  . warfarin (COUMADIN) 4 MG tablet TAKE 1 TABLET BY MOUTH ONCE DAILY AT  6  PM.  TAKE  AS  DIRECTED 90 tablet 3  . warfarin (COUMADIN) 7.5 MG tablet Take 7.5 mg by mouth See admin instructions. 4 mg for 2 days, 7.5 mg on the 3rd day, and repeat    . rosuvastatin (CRESTOR) 20 MG tablet Take 1 tablet (20 mg total) by mouth daily. 90 tablet 3   No current facility-administered medications for this  encounter.    BP 93/62   Pulse 81   Wt (!) 153.9 kg (339 lb 3.2 oz)   SpO2 98%   BMI 54.75 kg/m  General: NAD Neck: Thick, no JVD, no thyromegaly or thyroid nodule.  Lungs: Clear to auscultation bilaterally with normal respiratory effort. CV: Nondisplaced PMI.  Heart regular  S1/S2 with mechanical S2, no S3/S4, 2/6 early SEM RUSB. Trace ankle edema.   Abdomen: Soft, nontender, no hepatosplenomegaly, no distention.  Skin: Intact without lesions or rashes.  Neurologic: Alert and oriented x 3.  Psych: Normal affect. Extremities: No clubbing or cyanosis.  HEENT: Normal.   Assessment/Plan: 1. Chronic systolic CHF: Most recent echo with EF 40-45% in 7/20.  [place] in 7/20 showed right > left-sided CHF.  I suspect that severe sleep apnea plays a role in right-sided HF.  Coronary angiography in 7/20 showed nonobstructive CAD.  Weight is down and volume status looks better.  He decreased his Lasix to 40 mg bid, not sure why.  - I would keep Lasix at 60 mg bid.  BMET today and again in 2 wks.  - Continue spironolactone 25 mg daily.  - Continue current Toprol XL.  - SBP in 90s, he has some dizziness with standing, will decrease lisinopril to 5 mg daily.  2. OSA: Moderate OSA, still waiting for CPAP.  3. CAD: Nonobstructive.  I would aim for LDL < 70.  - Continue ASA 81.  - Stop Zocor and start Crestor 20 mg daily, lipids/LFTs in 2 months.   4. Mechanical aortic valve: Stable function on 7/20 echo.  Given history of CVA, needs Lovenox bridging whenever INR subtherapeutic.  He is on ASA 81 also.  5. Smoking: I strongly encouraged him to quit.  He did not tolerate Chantix.  Still smoking despite Wellbutrin.   - I asked him to try using nicotine patches also.  6. Ascending aortic aneurysm: Suspect this is related to his bicuspid aortic valve.  Last CTA in 7/20 showed aneurysm 5.5 cm.   - He is seeing Dr. Orvan Seen, will need ascending aorta replacement.  I will send him back to Dr. Orvan Seen as I think we  have him as stable as he will get.   Followup 3 months.   Loralie Champagne 05/06/2019

## 2019-05-06 NOTE — Patient Instructions (Addendum)
STOP ZOCOR  START Crestor 20mg  (1 tab) daily  START Lasix 60mg  TWICE A DAY  Labs today Basic Metabolic in 2 weeks Lipids and Liver Function test in 2 months We will only contact you if something comes back abnormal or we need to make some changes. Otherwise no news is good news!  Get over the counter nicotine patch 14mg /day.  Follow instructions on patch label.   Make an appointment with Dr Orvan Seen office at [phone]  Your physician recommends that you schedule a follow-up appointment in: 3 months with Dr Aundra Dubin.   At the [facility], you and your health needs are our priority. As part of our continuing mission to provide you with exceptional heart care, we have created designated Provider Care Teams. These Care Teams include your primary Cardiologist (physician) and Advanced Practice Providers (APPs- Physician Assistants and Nurse Practitioners) who all work together to provide you with the care you need, when you need it.   You may see any of the following providers on your designated Care Team at your next follow up: Marland Kitchen Dr Glori Bickers . Dr Loralie Champagne . Darrick Grinder, NP . Lyda Jester, PA   Please be sure to bring in all your medications bottles to every appointment.

## 2019-05-06 NOTE — Telephone Encounter (Signed)
[facility] states (Roberto [name]) they have attempted to call the patient 3 times and sent an email and has not had a response back from the patient. I will call the patient and inform him to call [facility] at [phone] to move forward with his cpap.  Called but no answer lmtcb.

## 2019-05-07 ENCOUNTER — Telehealth: Payer: Self-pay

## 2019-05-07 DIAGNOSIS — Z952 Presence of prosthetic heart valve: Secondary | ICD-10-CM | POA: Diagnosis not present

## 2019-05-07 DIAGNOSIS — Z7901 Long term (current) use of anticoagulants: Secondary | ICD-10-CM | POA: Diagnosis not present

## 2019-05-08 ENCOUNTER — Ambulatory Visit: Payer: Self-pay | Admitting: Pharmacist

## 2019-05-08 DIAGNOSIS — J449 Chronic obstructive pulmonary disease, unspecified: Secondary | ICD-10-CM

## 2019-05-08 NOTE — Patient Instructions (Signed)
Visit Information  Goals Addressed            This Visit's Progress     Patient Stated   . PharmD "We are having a hard time affording his medications" (pt-stated)       Current Barriers:  . Financial Barriers: patient has Medicare A/B + Part D plan, though cannot afford brand name medications. o [name] calls today, noting that he cannot afford the cost of his CPAP supplies o Received Spiriva supply in the mail over the weekend; received a 3 month supply; would need to submit proof of Extra Help denial for continued help . Applied for Medicare Extra Help on 04/10/2019;  . Dropped off out of pocket spend. Through 04/08/2019, patient has spent $477 in copays; must spend $600 to qualify for assistance through [place] for Advair and Ventolin.  o Would qualify for Dulera/Proventil through [employer]; though this process will take 6-8 weeks   Pharmacist Clinical Goal(s):  Marland Kitchen Over the next 60 days, patient will work with PharmD and providers to relieve medication access concerns  Interventions: . Discussed the need for a total of $600 out of pocket with [name]. She will contact [place] to determine if he has spent closer to $600 yet, and will call me back with this information . Will collaborate w/ CCM team for any support related to CPAP  Patient Self Care Activities:  . Patient will provide necessary portions of application   Please see past updates related to this goal by clicking on the "Past Updates" button in the selected goal         The patient verbalized understanding of instructions provided today and declined a print copy of patient instruction materials.   Plan:  - Will collaborate with patient/provider and CCM team as above  Catie Darnelle Maffucci, PharmD Clinical Pharmacist [place] [phone]

## 2019-05-08 NOTE — Chronic Care Management (AMB) (Signed)
Chronic Care Management   Follow Up Note   05/08/2019 Name: Roberto [name] MRN: [id number] DOB: 1956/03/30  Referred by: Valerie Roys, DO Reason for referral : Chronic Care Management (Medication Management)   Roberto [name] is a 63 y.o. year old male who is a primary care patient of Valerie Roys, DO. The CCM team was consulted for assistance with chronic disease management and care coordination needs.    Received call from patient's caregiver, [name], today.   Review of patient status, including review of consultants reports, relevant laboratory and other test results, and collaboration with appropriate care team members and the patient's provider was performed as part of comprehensive patient evaluation and provision of chronic care management services.    SDOH (Social Determinants of Health) screening performed today: Financial Strain . See Care Plan for related entries.   Outpatient Encounter Medications as of 05/08/2019  Medication Sig  . albuterol (PROVENTIL) (2.5 MG/3ML) 0.083% nebulizer solution Take 3 mLs (2.5 mg total) by nebulization every 6 (six) hours as needed for wheezing or shortness of breath.  Marland Kitchen albuterol (VENTOLIN HFA) 108 (90 Base) MCG/ACT inhaler Inhale 2 puffs into the lungs every 6 (six) hours as needed for wheezing or shortness of breath.  . allopurinol (ZYLOPRIM) 100 MG tablet Take 1 tablet by mouth once daily  . aspirin 81 MG chewable tablet Chew 81 mg by mouth daily.    Marland Kitchen buPROPion (WELLBUTRIN XL) 150 MG 24 hr tablet Take 1 tablet (150 mg total) by mouth daily for 3 days, THEN 1 tablet (150 mg total) 2 (two) times daily.  . cyclobenzaprine (FLEXERIL) 10 MG tablet Take 1 tablet (10 mg total) by mouth 3 times/day as needed-between meals & bedtime for muscle spasms.  . Fluticasone-Salmeterol (ADVAIR) 250-50 MCG/DOSE AEPB Inhale 1 puff into the lungs as needed.  . furosemide (LASIX) 40 MG tablet Take 1.5 tablets (60 mg total) by mouth 2 (two) times daily.  Marland Kitchen  lisinopril (ZESTRIL) 5 MG tablet Take 5 mg by mouth 2 (two) times daily.  . metoprolol succinate (TOPROL-XL) 100 MG 24 hr tablet TAKE 1 TABLET BY MOUTH ONCE DAILY WITH  OR  IMMEDIATELY  FOLLOWING  A  MEAL  . nortriptyline (PAMELOR) 10 MG capsule Take 1 capsule by mouth at bedtime  . Omega-3 Fatty Acids (FISH OIL) 1000 MG CPDR Take 2,000 mg by mouth daily.  . potassium chloride SA (K-DUR) 20 MEQ tablet Take 1 tablet (20 mEq total) by mouth daily.  . rosuvastatin (CRESTOR) 20 MG tablet Take 1 tablet (20 mg total) by mouth daily.  Marland Kitchen SPIRIVA HANDIHALER 18 MCG inhalation capsule PLACE ONE CAPSULE INTO INHALER AND INHALE THE CONTENTS BY MOUTH ONCE DAILY  . spironolactone (ALDACTONE) 25 MG tablet Take 1 tablet (25 mg total) by mouth daily.  Marland Kitchen warfarin (COUMADIN) 4 MG tablet TAKE 1 TABLET BY MOUTH ONCE DAILY AT  6  PM.  TAKE  AS  DIRECTED  . warfarin (COUMADIN) 7.5 MG tablet Take 7.5 mg by mouth See admin instructions. 4 mg for 2 days, 7.5 mg on the 3rd day, and repeat   No facility-administered encounter medications on file as of 05/08/2019.      Goals Addressed            This Visit's Progress     Patient Stated   . PharmD "We are having a hard time affording his medications" (pt-stated)       Current Barriers:  . Financial Barriers: patient has Medicare A/B +  Part D plan, though cannot afford brand name medications. o [name] calls today, noting that he cannot afford the cost of his CPAP supplies o Received Spiriva supply in the mail over the weekend; received a 3 month supply; would need to submit proof of Extra Help denial for continued help . Applied for Medicare Extra Help on 04/10/2019;  . Dropped off out of pocket spend. Through 04/08/2019, patient has spent $477 in copays; must spend $600 to qualify for assistance through [place] for Advair and Ventolin.  o Would qualify for Dulera/Proventil through [employer]; though this process will take 6-8 weeks   Pharmacist Clinical Goal(s):  Marland Kitchen Over the next  60 days, patient will work with PharmD and providers to relieve medication access concerns  Interventions: . Discussed the need for a total of $600 out of pocket with [name]. She will contact [place] to determine if he has spent closer to $600 yet, and will call me back with this information . Will collaborate w/ CCM team for any support related to CPAP  Patient Self Care Activities:  . Patient will provide necessary portions of application   Please see past updates related to this goal by clicking on the "Past Updates" button in the selected goal         Plan:  - Will collaborate with patient/provider and CCM team as above  Catie Darnelle Maffucci, PharmD Clinical Pharmacist [place] [phone]

## 2019-05-09 ENCOUNTER — Telehealth: Payer: Self-pay

## 2019-05-14 ENCOUNTER — Ambulatory Visit: Payer: Self-pay | Admitting: Licensed Clinical Social Worker

## 2019-05-14 ENCOUNTER — Telehealth: Payer: Self-pay

## 2019-05-14 NOTE — Chronic Care Management (AMB) (Signed)
  Care Management   Follow Up Note   05/14/2019 Name: Roberto [name] MRN: [id number] DOB: 1956-05-25  Referred by: Valerie Roys, DO Reason for referral : Care Coordination   Roberto [name] is a 63 y.o. year old male who is a primary care patient of Valerie Roys, DO. The care management team was consulted for assistance with care management and care coordination needs.    Review of patient status, including review of consultants reports, relevant laboratory and other test results, and collaboration with appropriate care team members and the patient's provider was performed as part of comprehensive patient evaluation and provision of chronic care management services.    LCSW completed CCM outreach attempt today but was unable to reach patient's family successfully. A HIPPA compliant voice message was left encouraging patient or family to return call once available. LCSW rescheduled CCM SW appointment as well.  A HIPPA compliant phone message was left for the patient providing contact information and requesting a return call.   [name], BSW, MSW, [facility] Care Management [email or web address] Phone: [phone]

## 2019-05-15 ENCOUNTER — Ambulatory Visit: Payer: Self-pay | Admitting: Licensed Clinical Social Worker

## 2019-05-15 LAB — POCT INR

## 2019-05-15 NOTE — Chronic Care Management (AMB) (Signed)
  Care Management   Follow Up Note   05/15/2019 Name: Roberto [name] MRN: [id number] DOB: Oct 14, 1955  Referred by: Valerie Roys, DO Reason for referral : Care Coordination   [name] is a 63 y.o. year old male who is a primary care patient of Valerie Roys, DO. The care management team was consulted for assistance with care management and care coordination needs.    Review of patient status, including review of consultants reports, relevant laboratory and other test results, and collaboration with appropriate care team members and the patient's provider was performed as part of comprehensive patient evaluation and provision of chronic care management services.    LCSW received missed phone call and voice message from patient's caregiver/friend last night. Friend report that they had a local hearing in regards to patient's Medicaid approval request and she is waiting to receive the decision determined by mail. Caregiver feels that patient will be denied again and at that point, they will take hearing to the state in [place]. Patient has no active Medicaid at this time.  The care management team will reach out to the patient again over the next 45 days.   [name], BSW, MSW, [facility] Care Management [email or web address] Phone: [phone]

## 2019-05-16 ENCOUNTER — Ambulatory Visit: Payer: Medicare Other | Admitting: Cardiothoracic Surgery

## 2019-05-21 LAB — POCT INR: INR: 2.6 (ref 2.0–3.0)

## 2019-05-22 ENCOUNTER — Telehealth: Payer: Self-pay

## 2019-05-26 ENCOUNTER — Telehealth (HOSPITAL_COMMUNITY): Payer: Self-pay

## 2019-05-26 NOTE — Telephone Encounter (Signed)
Received Notification from [employer] stating that they tried to contact the Patient with no response 3 times. I tried to call the Patient and the answering machine come straight on without ringing. I LMOM asking Patient to contact the office at his earliest convenience.

## 2019-05-27 ENCOUNTER — Telehealth: Payer: Self-pay

## 2019-05-27 ENCOUNTER — Ambulatory Visit: Payer: Self-pay | Admitting: Pharmacist

## 2019-05-27 NOTE — Chronic Care Management (AMB) (Signed)
  Chronic Care Management   Note  05/27/2019 Name: Roberto [name] MRN: [id number] DOB: 05-08-1956  Roberto [name] is a 63 y.o. year old male who is a primary care patient of Valerie Roys, DO. The CCM team was consulted for assistance with chronic disease management and care coordination needs.    Attempted to contact patient's caregiver/friend, [name],  for medication management review, to follow up on status of Medicare Extra Help, and patient's current OOP spend. Left HIPAA compliant message for [name] to return my call at her earliest convenience.   Follow up plan: - If I do not hear back, will attempt outreach again in the next 3-5 weeks  Catie Darnelle Maffucci, PharmD, [place] [phone]

## 2019-05-30 LAB — POCT INR: INR: 2.3 (ref 2.0–3.0)

## 2019-06-04 ENCOUNTER — Ambulatory Visit (INDEPENDENT_AMBULATORY_CARE_PROVIDER_SITE_OTHER): Payer: Medicare Other | Admitting: Internal Medicine

## 2019-06-04 ENCOUNTER — Encounter: Payer: Self-pay | Admitting: Internal Medicine

## 2019-06-04 ENCOUNTER — Other Ambulatory Visit: Payer: Self-pay

## 2019-06-04 ENCOUNTER — Other Ambulatory Visit
Admission: RE | Admit: 2019-06-04 | Discharge: 2019-06-04 | Disposition: A | Discharge status: Home / Self Care | Payer: Medicare Other | Attending: Cardiology | Admitting: Cardiology

## 2019-06-04 VITALS — BP 100/64 | HR 69 | Ht 66.0 in | Wt 348.8 lb

## 2019-06-04 DIAGNOSIS — I10 Essential (primary) hypertension: Secondary | ICD-10-CM | POA: Diagnosis not present

## 2019-06-04 DIAGNOSIS — I5022 Chronic systolic (congestive) heart failure: Secondary | ICD-10-CM | POA: Insufficient documentation

## 2019-06-04 LAB — HEPATIC FUNCTION PANEL
ALT: 18 U/L (ref 0–44)
AST: 19 U/L (ref 15–41)
Albumin: 4.1 g/dL (ref 3.5–5.0)
Alkaline Phosphatase: 46 U/L (ref 38–126)
Bilirubin, Direct: <0.1 mg/dL (ref 0.0–0.2)
Total Bilirubin: 0.6 mg/dL (ref 0.3–1.2)
Total Protein: 7.8 g/dL (ref 6.5–8.1)

## 2019-06-04 LAB — BASIC METABOLIC PANEL
Anion gap: 9 (ref 5–15)
BUN: 35 mg/dL — ABNORMAL HIGH (ref 8–23)
CO2: 25 mmol/L (ref 22–32)
Calcium: 9 mg/dL (ref 8.9–10.3)
Chloride: 103 mmol/L (ref 98–111)
Creatinine, Ser: 1.41 mg/dL — ABNORMAL HIGH (ref 0.61–1.24)
GFR calc Af Amer: >60 mL/min (ref >60)
GFR calc non Af Amer: 52 mL/min — ABNORMAL LOW (ref >60)
Glucose, Bld: 162 mg/dL — ABNORMAL HIGH (ref 70–99)
Potassium: 5.1 mmol/L (ref 3.5–5.1)
Sodium: 137 mmol/L (ref 135–145)

## 2019-06-04 LAB — LIPID PANEL
Cholesterol: 219 mg/dL — ABNORMAL HIGH (ref 0–200)
HDL: 40 mg/dL — ABNORMAL LOW (ref >40)
LDL Cholesterol: UNDETERMINED mg/dL (ref 0–99)
Total CHOL/HDL Ratio: 5.5 RATIO
Triglycerides: 559 mg/dL — ABNORMAL HIGH (ref <150)
VLDL: UNDETERMINED mg/dL (ref 0–40)

## 2019-06-04 NOTE — Patient Instructions (Signed)
Medication Instructions:  Your physician recommends that you continue on your current medications as directed. Please refer to the Current Medication list given to you today.  *If you need a refill on your cardiac medications before your next appointment, please call your pharmacy*  Lab Work: none If you have labs (blood work) drawn today and your tests are completely normal, you will receive your results only by: . MyChart Message (if you have MyChart) OR . A paper copy in the mail If you have any lab test that is abnormal or we need to change your treatment, we will call you to review the results.  Testing/Procedures: none  Follow-Up: At [facility], you and your health needs are our priority.  As part of our continuing mission to provide you with exceptional heart care, we have created designated Provider Care Teams.  These Care Teams include your primary Cardiologist (physician) and Advanced Practice Providers (APPs -  Physician Assistants and Nurse Practitioners) who all work together to provide you with the care you need, when you need it.  Your next appointment:   3 month(s)  The format for your next appointment:   In Person  Provider:    You may see Christopher End, MD or one of the following Advanced Practice Providers on your designated Care Team:    Christopher Berge, NP  Ryan Dunn, [name]  Jacquelyn Visser, [name] 

## 2019-06-04 NOTE — Progress Notes (Signed)
Follow-up Outpatient Visit Date: 06/04/2019  Primary Care Provider: Valerie Roys, DO [place] [address]  Chief Complaint: Follow-up heart failure, valvular heart disease, and thoracic aortic aneurysm  HPI:  Roberto [name] is a 63 y.o. male with history of mechanical aortic valve replacement, non-obstructive coronary artery disease, thoracic aortic aneurysm stroke, right renal infarct, chronic systolic heart failure (LVEF 40-45%), hyperlipidemia, and morbid obesity, who presents for follow-up of valvular heart disease, chronic systolic heart failure, and thoracic aortic aneurysm.  I last saw him in late August, at which time he was feeling well with slight improvement in his exertional dyspnea.  He was [name] in HF clinic by Dr. Aundra [name] a month ago, at which time it was noted that his weight was down 8 pounds.  He continued to have exertional dyspnea but noted less leg swelling.  Preceding sleep study showed OSA, and Roberto [name] was awaiting delivery of his CPAP.  It was felt htat Roberto [name] was optimized from a HF standpoint; he was referred back to Dr. Orvan [name] (CT surgery) to readdress ascending aortic aneurysm repair.  Today, Roberto [name] happily reports that he stopped smoking 5 days ago.  He already feels like his breathing has improved.  Unfortunately, he has put on some weight, which she attributes to eating more since quitting smoking.  He has not had any significant edema.  He also denies orthopnea, chest pain, palpitations, and lightheadedness.  Exertional dyspnea with modest activity is unchanged from baseline.  He is tolerating his current medications well.  Most recent INR check through his PCP was therapeutic at 2.6.  Roberto [name] is still awaiting delivery of his CPAP.  --------------------------------------------------------------------------------------------------  Past Medical History:  Diagnosis Date   Bicuspid aortic valve    a. s/p #27 [employer]omed[employer] mechanical valve on  03/25/2010; b. on Coumadin; c. TTE 12/17: EF 40-45%, moderately dilated LV with moderate LVH, AVR well-seated with 14 mmHg gradient, peak AV velocity 2.5 m/s, mild mitral valve thickening with mild MR, mildly dilated RV with mildly reduced contraction   Cellulitis    Chronic systolic CHF (congestive heart failure) ([place])    a. R/LHC 03/2010 showed no significant CAD, LVEDP 31 mmHg, mean AoV gradient 34 mmHg at rest and 47 mmHg with dobutamine 20 mcg/kg/min, AVA 1.0 cm^2, RA 31, RV 68/25, PA 68/47, PCWP 38. PA sat 65%. CO 6.2 L/min (Fick) and 5.3 L/min (thermodilution)   Clotting disorder (HCC)    H/O mechanical aortic valve replacement 03/25/2010   a. #27 Carbomedics mechanical valve   Hypercholesterolemia    Renal infarct [facility]) 2017   Multiple right renal infarcts, likely embolic.   Stroke [facility])    TIA (transient ischemic attack) 05/2014   Past Surgical History:  Procedure Laterality Date   AORTIC VALVE REPLACEMENT     CARDIAC CATHETERIZATION  03/21/2010   No significant CAD. Severe aortic stenosis. Severely elevated left and right heart filling pressures.   CARDIAC SURGERY  2009   CHF   CARPAL TUNNEL RELEASE Left 2005   RIGHT HEART CATH AND CORONARY ANGIOGRAPHY N/A 01/28/2019   Procedure: RIGHT HEART CATH AND CORONARY ANGIOGRAPHY;  Surgeon: Nelva Bush, MD;  Location: [facility];  Service: Cardiovascular;  Laterality: N/A;   TONSILLECTOMY  1962    Current Meds  Medication Sig   albuterol (PROVENTIL) (2.5 MG/3ML) 0.083% nebulizer solution Take 3 mLs (2.5 mg total) by nebulization every 6 (six) hours as needed for wheezing or shortness of breath.  albuterol (VENTOLIN HFA) 108 (90 Base) MCG/ACT inhaler Inhale 2 puffs into the lungs every 6 (six) hours as needed for wheezing or shortness of breath.   allopurinol (ZYLOPRIM) 100 MG tablet Take 1 tablet by mouth once daily   aspirin 81 MG chewable tablet Chew 81 mg by mouth daily.     buPROPion (WELLBUTRIN  XL) 150 MG 24 hr tablet Take 1 tablet (150 mg total) by mouth daily for 3 days, THEN 1 tablet (150 mg total) 2 (two) times daily.   cyclobenzaprine (FLEXERIL) 10 MG tablet Take 1 tablet (10 mg total) by mouth 3 times/day as needed-between meals & bedtime for muscle spasms.   Fluticasone-Salmeterol (ADVAIR) 250-50 MCG/DOSE AEPB Inhale 1 puff into the lungs as needed.   furosemide (LASIX) 40 MG tablet Take 1.5 tablets (60 mg total) by mouth 2 (two) times daily.   lisinopril (ZESTRIL) 5 MG tablet Take 5 mg by mouth 2 (two) times daily.   metoprolol succinate (TOPROL-XL) 100 MG 24 hr tablet TAKE 1 TABLET BY MOUTH ONCE DAILY WITH  OR  IMMEDIATELY  FOLLOWING  A  MEAL   nortriptyline (PAMELOR) 10 MG capsule Take 1 capsule by mouth at bedtime   Omega-3 Fatty Acids (FISH OIL) 1000 MG CPDR Take 2,000 mg by mouth daily.   potassium chloride SA (K-DUR) 20 MEQ tablet Take 1 tablet (20 mEq total) by mouth daily.   rosuvastatin (CRESTOR) 20 MG tablet Take 1 tablet (20 mg total) by mouth daily.   SPIRIVA HANDIHALER 18 MCG inhalation capsule PLACE ONE CAPSULE INTO INHALER AND INHALE THE CONTENTS BY MOUTH ONCE DAILY   spironolactone (ALDACTONE) 25 MG tablet Take 1 tablet (25 mg total) by mouth daily.   warfarin (COUMADIN) 4 MG tablet TAKE 1 TABLET BY MOUTH ONCE DAILY AT  6  PM.  TAKE  AS  DIRECTED   warfarin (COUMADIN) 7.5 MG tablet Take 7.5 mg by mouth See admin instructions. 4 mg for 2 days, 7.5 mg on the 3rd day, and repeat    Allergies: Patient has no known allergies.  Social History   Tobacco Use   Smoking status: Former Smoker    Packs/day: 0.50    Years: 46.00    Pack years: 23.00    Types: Cigarettes    Start date: 07/16/2017    Quit date: 06/02/2019   Smokeless tobacco: Never Used   Tobacco comment: 10 cigarettes a day  Substance Use Topics   Alcohol use: No    Alcohol/week: 0.0 standard drinks   Drug use: No    Family History  Problem Relation Age of Onset   Arthritis  Mother    Dementia Mother    Colon cancer Mother    Arthritis Father    Diabetes Father    Stroke Father    Colon cancer Father    Heart attack Brother    Breast cancer Sister    Seizures Sister    Cancer Brother        brain   Heart disease Brother    Heart attack Brother     Review of Systems: A 12-system review of systems was performed and was negative except as noted in the HPI.  --------------------------------------------------------------------------------------------------  Physical Exam: BP 100/64 (BP Location: Left Arm, Patient Position: Sitting, Cuff Size: Large)    Pulse 69    Ht 5\' 6"  (1.676 m)    Wt (!) 348 lb 12 oz (158.2 kg)    SpO2 98%    BMI 56.29 kg/m  General: NAD. HEENT: No conjunctival pallor or scleral icterus. Facemask in place. Neck: Supple without lymphadenopathy, thyromegaly, JVD, or HJR, though body habitus limits evaluation.. Lungs: Normal work of breathing. Clear to auscultation bilaterally without wheezes or crackles. Heart: Distant heart sounds.  Regular rate and rhythm without murmurs, rubs, or gallops.. Abd: Bowel sounds present. Soft, NT/ND without hepatosplenomegaly Ext: Trace nonpitting calf edema. Skin: Warm and dry.  Skin thickening and scaly appearance of both calves noted.  EKG: Normal sinus rhythm with lateral T wave inversions and subtle ST depressions.  Compared with prior tracing from 05/06/2019, PVCs are no longer present.  Otherwise, there has been no significant interval change.  Lab Results  Component Value Date   WBC 8.2 02/20/2019   HGB 14.1 02/20/2019   HCT 43.9 02/20/2019   MCV 92.8 02/20/2019   PLT 243 02/20/2019    Lab Results  Component Value Date   NA 137 06/04/2019   K 5.1 06/04/2019   CL 103 06/04/2019   CO2 25 06/04/2019   BUN 35 (H) 06/04/2019   CREATININE 1.41 (H) 06/04/2019   GLUCOSE 162 (H) 06/04/2019   ALT 18 06/04/2019    Lab Results  Component Value Date   CHOL 219 (H) 06/04/2019     HDL 40 (L) 06/04/2019   LDLCALC UNABLE TO CALCULATE IF TRIGLYCERIDE OVER 400 mg/dL 06/04/2019   LDLDIRECT 94 12/31/2018   TRIG 559 (H) 06/04/2019   CHOLHDL 5.5 06/04/2019    --------------------------------------------------------------------------------------------------  ASSESSMENT AND PLAN: Chronic systolic and diastolic heart failure due to nonischemic cardiomyopathy: Roberto [name] has trace pretibial edema but otherwise appears euvolemic on exam.  He has gained pounds over the last month, which she attributes to dietary indiscretion.  I encouraged him to watch his diet and to continue avoiding sodium.  Given borderline low blood pressure, I am reluctant to escalate his evidence-based heart failure therapy further.  BMP today shows stable to slightly elevated creatinine from baseline and upper normal potassium.  We will therefore continue with furosemide 60 mg twice daily and spironolactone 25 mg daily.  Nonobstructive coronary artery disease: No symptoms to suggest worsening coronary insufficiency.  Roberto [name] was recently transitioned from simvastatin to rosuvastatin by Dr. Aundra [name].  Lipid panel drawn today is pending at this time.  Continue with medical therapy.  I commended Roberto [name] on quitting smoking.  Thoracic aortic aneurysm: Asymptomatic.  Roberto [name] is scheduled for follow-up with Dr. Orvan [name] next week.  I believe that Roberto [name] is optimized from a cardiac standpoint and can hopefully proceed with a sending aortic aneurysm repair in the near future.  Bicuspid aortic valve status post mechanical AVR: No evidence of valve dysfunction on exam today.  Continue aspirin and warfarin.  Morbid obesity: Weight loss encouraged through diet and exercise.  Follow-up: Return to clinic in 3 months.  Nelva Bush, MD 06/04/2019 10:02 AM

## 2019-06-05 LAB — LDL CHOLESTEROL, DIRECT: Direct LDL: 96.4 mg/dL (ref 0–99)

## 2019-06-06 ENCOUNTER — Telehealth (HOSPITAL_COMMUNITY): Payer: Self-pay

## 2019-06-06 NOTE — Telephone Encounter (Signed)
[place] Sleep study Voided after 3 Attempts To Contact.

## 2019-06-09 ENCOUNTER — Telehealth: Payer: Medicare Other

## 2019-06-09 ENCOUNTER — Other Ambulatory Visit: Payer: Self-pay | Admitting: Family Medicine

## 2019-06-09 ENCOUNTER — Ambulatory Visit: Payer: Self-pay | Admitting: Licensed Clinical Social Worker

## 2019-06-09 ENCOUNTER — Telehealth (HOSPITAL_COMMUNITY): Payer: Self-pay

## 2019-06-09 ENCOUNTER — Ambulatory Visit (INDEPENDENT_AMBULATORY_CARE_PROVIDER_SITE_OTHER): Payer: Medicare Other | Admitting: Cardiothoracic Surgery

## 2019-06-09 ENCOUNTER — Other Ambulatory Visit: Payer: Self-pay

## 2019-06-09 VITALS — BP 131/81 | HR 96 | Temp 97.7°F | Resp 20 | Ht 66.0 in | Wt 348.0 lb

## 2019-06-09 DIAGNOSIS — I712 Thoracic aortic aneurysm, without rupture, unspecified: Secondary | ICD-10-CM

## 2019-06-09 DIAGNOSIS — Q231 Congenital insufficiency of aortic valve: Secondary | ICD-10-CM

## 2019-06-09 MED ORDER — ICOSAPENT ETHYL 1 G PO CAPS: 2.0000 g | ORAL_CAPSULE | Freq: Two times a day (BID) | ORAL | 5 refills | Status: DC

## 2019-06-09 MED ORDER — ROSUVASTATIN CALCIUM 40 MG PO TABS: 40.0000 mg | ORAL_TABLET | Freq: Every day | ORAL | 3 refills | Status: DC

## 2019-06-09 NOTE — Telephone Encounter (Signed)
pts wife aware of results, advised of med changes and recommendations.  Verbalized understanding.  She will also seek advice from primary MD for nutritionist as patient was instructed to lose weight for his surgery.

## 2019-06-09 NOTE — Telephone Encounter (Signed)
Requested medication (s) are due for refill today: yes  Requested medication (s) are on the active medication list: yes  Last refill:  03/21/2019  Future visit scheduled: no  Notes to clinic:  Review medication for refills   Requested Prescriptions  Pending Prescriptions Disp Refills   nortriptyline (PAMELOR) 10 MG capsule [Pharmacy Med Name: Nortriptyline HCl 10 MG Oral Capsule] 90 capsule 0    Sig: Take 1 capsule by mouth at bedtime     Psychiatry:  Antidepressants - Heterocyclics (TCAs) Failed - 06/09/2019  3:28 PM      Failed - Completed PHQ-2 or PHQ-9 in the last 360 days.      Passed - Valid encounter within last 6 months    Recent Outpatient Visits          2 months ago Chronic obstructive pulmonary disease with acute exacerbation ([place])   [place], [place], [place]   4 months ago H/O mechanical aortic valve replacement   [place], Register T, NP   4 months ago Procedure and treatment not carried out due to patient leaving prior to being seen by health care provider   [facility], [name], DO   4 months ago H/O mechanical aortic valve replacement   [facility] [place], [name], DO   4 months ago H/O mechanical aortic valve replacement   [facility] [place], Barb Merino, DO      Future Appointments            In 2 months End, Harrell Gave, MD [facility]            allopurinol (ZYLOPRIM) 100 MG tablet [Pharmacy Med Name: Allopurinol 100 MG Oral Tablet] 90 tablet 0    Sig: Take 1 tablet by mouth once daily     Endocrinology:  Gout Agents Failed - 06/09/2019  3:28 PM      Failed - Uric Acid in normal range and within 360 days    Uric Acid  Date Value Ref Range Status  04/03/2018 8.2 3.7 - 8.6 mg/dL Final    Comment:               Therapeutic target for gout patients: <6.0         Failed - Cr in normal range and within 360 days    Creatinine   Date Value Ref Range Status  05/25/2014 0.85 0.60 - 1.30 mg/dL Final   Creatinine, Ser  Date Value Ref Range Status  06/04/2019 1.41 (H) 0.61 - 1.24 mg/dL Final         Passed - Valid encounter within last 12 months    Recent Outpatient Visits          2 months ago Chronic obstructive pulmonary disease with acute exacerbation ([place])   [place], [place], [name]   4 months ago H/O mechanical aortic valve replacement   [place], Henrine Screws T, NP   4 months ago Procedure and treatment not carried out due to patient leaving prior to being seen by health care provider   [place], [name], DO   4 months ago H/O mechanical aortic valve replacement   [facility] [place], [name], DO   4 months ago H/O mechanical aortic valve replacement   [facility] Valerie Roys, DO      Future Appointments  In 2 months End, Harrell Gave, MD [facility], [place]

## 2019-06-09 NOTE — Chronic Care Management (AMB) (Signed)
  Care Management   Follow Up Note   06/09/2019 Name: Roberto [name] MRN: [id number] DOB: 03/28/1956  Referred by: Valerie Roys, DO Reason for referral : Care Coordination   Roberto [name] is a 63 y.o. year old male who is a primary care patient of Valerie Roys, DO. The care management team was consulted for assistance with care management and care coordination needs.    Review of patient status, including review of consultants reports, relevant laboratory and other test results, and collaboration with appropriate care team members and the patient's provider was performed as part of comprehensive patient evaluation and provision of chronic care management services.    LCSW completed CCM outreach attempt today but was unable to reach patient successfully. A HIPPA compliant voice message was left encouraging patient to return call once available. LCSW rescheduled CCM SW appointment as well.  A HIPPA compliant phone message was left for the patient providing contact information and requesting a return call.   [name], BSW, MSW, [facility] Care Management [email or web address] Phone: [phone]

## 2019-06-09 NOTE — Telephone Encounter (Signed)
Needs appointment

## 2019-06-09 NOTE — Telephone Encounter (Signed)
-----   Message from Larey Dresser, MD sent at 06/04/2019 10:39 PM EST ----- Triglycerides very high.  Would suggest initiation of Vascepa 2 g bid, repeat lipids 2 months.

## 2019-06-10 ENCOUNTER — Other Ambulatory Visit: Payer: Self-pay

## 2019-06-10 ENCOUNTER — Ambulatory Visit (INDEPENDENT_AMBULATORY_CARE_PROVIDER_SITE_OTHER): Payer: Medicare Other | Admitting: Family Medicine

## 2019-06-10 ENCOUNTER — Encounter: Payer: Self-pay | Admitting: Family Medicine

## 2019-06-10 ENCOUNTER — Ambulatory Visit: Payer: Self-pay | Admitting: Pharmacist

## 2019-06-10 ENCOUNTER — Telehealth: Payer: Medicare Other | Admitting: Family Medicine

## 2019-06-10 VITALS — BP 100/64 | HR 69 | Temp 98.0°F | Wt 348.0 lb

## 2019-06-10 DIAGNOSIS — J441 Chronic obstructive pulmonary disease with (acute) exacerbation: Secondary | ICD-10-CM

## 2019-06-10 DIAGNOSIS — Z952 Presence of prosthetic heart valve: Secondary | ICD-10-CM | POA: Diagnosis not present

## 2019-06-10 DIAGNOSIS — Z720 Tobacco use: Secondary | ICD-10-CM

## 2019-06-10 DIAGNOSIS — I38 Endocarditis, valve unspecified: Secondary | ICD-10-CM

## 2019-06-10 DIAGNOSIS — M1A9XX0 Chronic gout, unspecified, without tophus (tophi): Secondary | ICD-10-CM

## 2019-06-10 DIAGNOSIS — E119 Type 2 diabetes mellitus without complications: Secondary | ICD-10-CM | POA: Diagnosis not present

## 2019-06-10 DIAGNOSIS — I10 Essential (primary) hypertension: Secondary | ICD-10-CM

## 2019-06-10 DIAGNOSIS — E785 Hyperlipidemia, unspecified: Secondary | ICD-10-CM

## 2019-06-10 DIAGNOSIS — Z7901 Long term (current) use of anticoagulants: Secondary | ICD-10-CM | POA: Diagnosis not present

## 2019-06-10 DIAGNOSIS — I5022 Chronic systolic (congestive) heart failure: Secondary | ICD-10-CM

## 2019-06-10 DIAGNOSIS — I2510 Atherosclerotic heart disease of native coronary artery without angina pectoris: Secondary | ICD-10-CM

## 2019-06-10 LAB — POCT INR: INR: 2.6 (ref 2.0–3.0)

## 2019-06-10 MED ORDER — NORTRIPTYLINE HCL 10 MG PO CAPS: 10.0000 mg | ORAL_CAPSULE | Freq: Every day | ORAL | 1 refills | Status: DC

## 2019-06-10 MED ORDER — ALLOPURINOL 100 MG PO TABS: 100.0000 mg | ORAL_TABLET | Freq: Every day | ORAL | 1 refills | Status: DC

## 2019-06-10 NOTE — Assessment & Plan Note (Signed)
Stable and under good control, actively working on quitting smoking. Continue current regimen

## 2019-06-10 NOTE — Progress Notes (Signed)
BP 100/64   Pulse 69   Temp 98 F (36.7 C) (Oral)   Wt (!) 348 lb (157.9 kg)   SpO2 93%   BMI 56.17 kg/m    Subjective:    Patient ID: Roberto [name], male    DOB: [birth year], 63 y.o.   MRN: [id number]  HPI: Roberto [name] is a 63 y.o. male  Chief Complaint  Patient presents with  . Hypertension  . Gout  . Medication Refill    . This visit was completed via WebEx due to the restrictions of the COVID-19 pandemic. All issues as above were discussed and addressed. Physical exam was done as above through visual confirmation on WebEx. If it was felt that the patient should be evaluated in the office, they were directed there. The patient verbally consented to this visit. . Location of the patient: home . Location of the provider: home . Those involved with this call:  . Provider: Merrie Roof, [name] . CMA: Lesle Chris, [place] . Front Desk/Registration: Jill Side  . Time spent on call: 25 minutes with patient face to face via video conference. More than 50% of this time was spent in counseling and coordination of care. 10 minutes total spent in review of patient's record and preparation of their chart. I verified patient identity using two factors (patient name and date of birth). Patient consents verbally to being seen via telemedicine visit today.   Patient presenting today for chronic condition f/u.   Due for INR recheck. Currently alternating 7.5 mg with 4 mg coumadin. No bleeding or bruising issues noted.   Following with Cardiology closely for CHF, valvular disease, HTN, HLD. Several days ago crestor was increased and vascepa was initiated for hypertriglyceridemia. Tolerating medicines well so far. Denies current CP, SOB, dizziness. Cardiology requesting nutritionist referral for weight loss prior to upcoming procedure.   Has been out of allopurinol for about a week now. No recent gout flares.   COPD - stable on inhaler regimen without wheezing, SOB, significant cough.    Smoking - has been 10 days without smoking and hoping to be done for good. On wellbutrin and nortiptyline for this per patient. Tolerating well.   Relevant past medical, surgical, family and social history reviewed and updated as indicated. Interim medical history since our last visit reviewed. Allergies and medications reviewed and updated.  Review of Systems  Per HPI unless specifically indicated above     Objective:    BP 100/64   Pulse 69   Temp 98 F (36.7 C) (Oral)   Wt (!) 348 lb (157.9 kg)   SpO2 93%   BMI 56.17 kg/m   Wt Readings from Last 3 Encounters:  06/10/19 (!) 348 lb (157.9 kg)  06/09/19 (!) 348 lb (157.9 kg)  06/04/19 (!) 348 lb 12 oz (158.2 kg)    Physical Exam Vitals signs and nursing note reviewed.  Constitutional:      General: He is not in acute distress.    Appearance: Normal appearance.  HENT:     Head: Atraumatic.     Right Ear: External ear normal.     Left Ear: External ear normal.     Nose: Nose normal. No congestion.     Mouth/Throat:     Mouth: Mucous membranes are moist.     Pharynx: Oropharynx is clear.  Eyes:     Extraocular Movements: Extraocular movements intact.     Conjunctiva/sclera: Conjunctivae normal.  Neck:     Musculoskeletal: Normal range of  motion.  Pulmonary:     Effort: Pulmonary effort is normal. No respiratory distress.  Musculoskeletal: Normal range of motion.  Skin:    General: Skin is dry.     Findings: No erythema or rash.  Neurological:     Mental Status: He is oriented to person, place, and time.  Psychiatric:        Mood and Affect: Mood normal.        Thought Content: Thought content normal.        Judgment: Judgment normal.     Results for orders placed or performed during the hospital encounter of 06/04/19  Hepatic function panel  Result Value Ref Range   Total Protein 7.8 6.5 - 8.1 g/dL   Albumin 4.1 3.5 - 5.0 g/dL   AST 19 15 - 41 U/L   ALT 18 0 - 44 U/L   Alkaline Phosphatase 46 38 - 126  U/L   Total Bilirubin 0.6 0.3 - 1.2 mg/dL   Bilirubin, Direct <0.1 0.0 - 0.2 mg/dL   Indirect Bilirubin NOT CALCULATED 0.3 - 0.9 mg/dL  Lipid Profile  Result Value Ref Range   Cholesterol 219 (H) 0 - 200 mg/dL   Triglycerides 559 (H) <150 mg/dL   HDL 40 (L) >40 mg/dL   Total CHOL/HDL Ratio 5.5 RATIO   VLDL UNABLE TO CALCULATE IF TRIGLYCERIDE OVER 400 mg/dL 0 - 40 mg/dL   LDL Cholesterol UNABLE TO CALCULATE IF TRIGLYCERIDE OVER 400 mg/dL 0 - 99 mg/dL  Basic Metabolic Panel (BMET)  Result Value Ref Range   Sodium 137 135 - 145 mmol/L   Potassium 5.1 3.5 - 5.1 mmol/L   Chloride 103 98 - 111 mmol/L   CO2 25 22 - 32 mmol/L   Glucose, Bld 162 (H) 70 - 99 mg/dL   BUN 35 (H) 8 - 23 mg/dL   Creatinine, Ser 1.41 (H) 0.61 - 1.24 mg/dL   Calcium 9.0 8.9 - 10.3 mg/dL   GFR calc non Af Amer 52 (L) >60 mL/min   GFR calc Af Amer >60 >60 mL/min   Anion gap 9 5 - 15  LDL cholesterol, direct  Result Value Ref Range   Direct LDL 96.4 0 - 99 mg/dL      Assessment & Plan:   Problem List Items Addressed This Visit      Cardiovascular and Mediastinum   Essential hypertension    Appears to be under good control. Monitored closely by Cardiology. Continue current regimen      Chronic systolic heart failure ([place])    Followed by Cardiology, continue per their recommendations      Valvular heart disease    Followed by Cardiology, continue per their recommendations        Respiratory   COPD (chronic obstructive pulmonary disease) ([place])    Stable and under good control, actively working on quitting smoking. Continue current regimen        Endocrine   Diabetes mellitus type 2, diet-controlled ([place]) - Primary    Recheck INR, continue working on lifestyle modifications. Referral placed to nutritionist      Relevant Orders   Amb Referral to Nutrition and Diabetic E   Bayer DCA Hb A1c Waived     Other   H/O mechanical aortic valve replacement    Unclear which service is managing his INR  consistently as it appears our clinic was managing long term but now potentially he is managed by Cardiology. Due for recheck so will place lab order today  either way. Continue current regimen and adjust as needed based on results      Relevant Orders   CoaguChek XS/INR Waived   Hyperlipidemia    Followed by Cardiology, recent increase in regimen and will work on diet and exercise changes for further control      Tobacco abuse    10 days without smoking currently, hoping to be done for good. Continue current regimen, congratulated success      Chronic gout without tophus    Recheck uric acid, refill allopurinol. Continue current regimen and work on lifestyle modifications for control      Relevant Orders   Uric acid       Follow up plan: Return in about 4 weeks (around 07/08/2019) for INR.

## 2019-06-10 NOTE — Assessment & Plan Note (Signed)
Recheck uric acid, refill allopurinol. Continue current regimen and work on lifestyle modifications for control

## 2019-06-10 NOTE — Telephone Encounter (Signed)
Pt has virtual appt today 06-10-2019

## 2019-06-10 NOTE — Assessment & Plan Note (Signed)
Unclear which service is managing his INR consistently as it appears our clinic was managing long term but now potentially he is managed by Cardiology. Due for recheck so will place lab order today either way. Continue current regimen and adjust as needed based on results

## 2019-06-10 NOTE — Chronic Care Management (AMB) (Signed)
Chronic Care Management   Follow Up Note   06/10/2019 Name: Roberto [name] MRN: [id number] DOB: 09/11/1955  Referred by: Valerie Roys, DO Reason for referral : Chronic Care Management (Medication Management)   Roberto [name] is a 63 y.o. year old male who is a primary care patient of Valerie Roys, DO. The CCM team was consulted for assistance with chronic disease management and care coordination needs.    Received call from patient's caregiver, [name], regarding medication access needs.   Review of patient status, including review of consultants reports, relevant laboratory and other test results, and collaboration with appropriate care team members and the patient's provider was performed as part of comprehensive patient evaluation and provision of chronic care management services.    SDOH (Social Determinants of Health) screening performed today: Financial Strain  Tobacco Use. See Care Plan for related entries.   Outpatient Encounter Medications as of 06/10/2019  Medication Sig  . albuterol (PROVENTIL) (2.5 MG/3ML) 0.083% nebulizer solution Take 3 mLs (2.5 mg total) by nebulization every 6 (six) hours as needed for wheezing or shortness of breath.  Marland Kitchen albuterol (VENTOLIN HFA) 108 (90 Base) MCG/ACT inhaler Inhale 2 puffs into the lungs every 6 (six) hours as needed for wheezing or shortness of breath.  . allopurinol (ZYLOPRIM) 100 MG tablet Take 1 tablet (100 mg total) by mouth daily.  Marland Kitchen aspirin 81 MG chewable tablet Chew 81 mg by mouth daily.    Marland Kitchen buPROPion (WELLBUTRIN XL) 150 MG 24 hr tablet Take 1 tablet (150 mg total) by mouth daily for 3 days, THEN 1 tablet (150 mg total) 2 (two) times daily.  . cyclobenzaprine (FLEXERIL) 10 MG tablet Take 1 tablet (10 mg total) by mouth 3 times/day as needed-between meals & bedtime for muscle spasms.  . Fluticasone-Salmeterol (ADVAIR) 250-50 MCG/DOSE AEPB Inhale 1 puff into the lungs as needed.  . furosemide (LASIX) 40 MG tablet Take 1.5  tablets (60 mg total) by mouth 2 (two) times daily.  Marland Kitchen icosapent Ethyl (VASCEPA) 1 g capsule Take 2 capsules (2 g total) by mouth 2 (two) times daily.  Marland Kitchen lisinopril (ZESTRIL) 5 MG tablet Take 5 mg by mouth 2 (two) times daily.  . metoprolol succinate (TOPROL-XL) 100 MG 24 hr tablet TAKE 1 TABLET BY MOUTH ONCE DAILY WITH  OR  IMMEDIATELY  FOLLOWING  A  MEAL  . nortriptyline (PAMELOR) 10 MG capsule Take 1 capsule (10 mg total) by mouth at bedtime.  . Omega-3 Fatty Acids (FISH OIL) 1000 MG CPDR Take 2,000 mg by mouth daily.  . potassium chloride SA (K-DUR) 20 MEQ tablet Take 1 tablet (20 mEq total) by mouth daily.  . rosuvastatin (CRESTOR) 40 MG tablet Take 1 tablet (40 mg total) by mouth daily.  Marland Kitchen SPIRIVA HANDIHALER 18 MCG inhalation capsule PLACE ONE CAPSULE INTO INHALER AND INHALE THE CONTENTS BY MOUTH ONCE DAILY  . spironolactone (ALDACTONE) 25 MG tablet Take 1 tablet (25 mg total) by mouth daily.  Marland Kitchen warfarin (COUMADIN) 4 MG tablet TAKE 1 TABLET BY MOUTH ONCE DAILY AT  6  PM.  TAKE  AS  DIRECTED  . warfarin (COUMADIN) 7.5 MG tablet Take 7.5 mg by mouth See admin instructions. 4 mg for 2 days, 7.5 mg on the 3rd day, and repeat   No facility-administered encounter medications on file as of 06/10/2019.      Goals Addressed            This Visit's Progress     Patient Stated   .  PharmD "We are having a hard time affording his medications" (pt-stated)       Current Barriers:  . Polypharmacy; complex patient with multiple comorbidities including HFrEF, aortic stenosis, atherosclerosis, T2DM, COPD d/t tobacco abuse, gout . Caregiver, [name], manages his medications . Financial barriers - patient has applied for Medicaid, but has been denied and [name] notes that hearing didn't go well. Collaboratively applied for Medicare Extra Help on 04/10/2019.  o HFrEF (40-45%); followed by Dr. Saunders Revel, seen in [facility] by Aundra Dubin o - furoesmide 60 mg BID, spironolactone 25 mg daily, lisinopril 5 mg daily,  metoprolol succinate 100 mg daily; potassium 20 mEq daily  - Lisinopril last filled 01/03/2019 for 90 day supply o ASCVD risk reduction: rosuvastatin 40 mg daily - just increased d/t LDL of 93, TG >500; goal LDL <70. Vascepa 2 g BID also added; received call from [name] that [name] is too expensive on his insurance; ASA 81 mg daily - Noted to cardiology that he stopped smoking ~10 days ago o Aortic aneurysm; following w/ CT surgery to schedule repair o S/p mechanical AVR; warfarin daily to maintain INR 2-3 o COPD/tobacco abuse: patient noted he quit smoking ~10 days ago; continues on Advair 250/50 BID + Spiriva 81 mcg daily, albuterol HFA PRN; bupropion XL 150 mg daily for tobacco cessation - Receiving Spiriva through [employer] patient assistance through end of 2020. Will need to start the process to reapply for 2021 o Chronic pain: nortriptyline 10 mg QPM; cyclobenzaprine 10 mg daily  o Gout: allopurinol 100 mg daily   Pharmacist Clinical Goal(s):  Marland Kitchen Over the next 90 days, patient will work with PharmD and provider towards optimized medication management  Interventions: . Comprehensive medication review performed; medication list updated in electronic medical record . Contacted [name] to discuss her concerns w/ Vascepa cost. Left HIPAA compliant message for her to return my call at her convenience. There is not patient assistance for Vascepa, however, could discuss application to [employer] for hyperlipidemia.   Patient Self Care Activities:  . Patient will take medications as prescribed  Initial goal documentation        Plan: - If I do not hear back from [name], will outreach for continued medication management support in ~4-5 weeks  Catie Darnelle Maffucci, PharmD, [place] [phone]

## 2019-06-10 NOTE — Assessment & Plan Note (Signed)
Appears to be under good control. Monitored closely by Cardiology. Continue current regimen

## 2019-06-10 NOTE — Assessment & Plan Note (Signed)
Recheck INR, continue working on lifestyle modifications. Referral placed to nutritionist

## 2019-06-10 NOTE — Assessment & Plan Note (Signed)
10 days without smoking currently, hoping to be done for good. Continue current regimen, congratulated success

## 2019-06-10 NOTE — Assessment & Plan Note (Signed)
Followed by Cardiology, continue per their recommendations 

## 2019-06-10 NOTE — Assessment & Plan Note (Signed)
Followed by Cardiology, recent increase in regimen and will work on diet and exercise changes for further control

## 2019-06-10 NOTE — Patient Instructions (Signed)
Visit Information  Goals Addressed            This Visit's Progress     Patient Stated   . PharmD "We are having a hard time affording his medications" (pt-stated)       Current Barriers:  . Polypharmacy; complex patient with multiple comorbidities including HFrEF, aortic stenosis, atherosclerosis, T2DM, COPD d/t tobacco abuse, gout . Caregiver, [name], manages his medications . Financial barriers - patient has applied for Medicaid, but has been denied and [name] notes that hearing didn't go well. Collaboratively applied for Medicare Extra Help on 04/10/2019.  o HFrEF (40-45%); followed by Dr. Saunders Revel, seen in [facility] by Aundra Dubin o - furoesmide 60 mg BID, spironolactone 25 mg daily, lisinopril 5 mg daily, metoprolol succinate 100 mg daily; potassium 20 mEq daily  - Lisinopril last filled 01/03/2019 for 90 day supply o ASCVD risk reduction: rosuvastatin 40 mg daily - just increased d/t LDL of 93, TG >500; goal LDL <70. Vascepa 2 g BID also added; received call from [name] that [name] is too expensive on his insurance; ASA 81 mg daily - Noted to cardiology that he stopped smoking ~10 days ago o Aortic aneurysm; following w/ CT surgery to schedule repair o S/p mechanical AVR; warfarin daily to maintain INR 2-3 o COPD/tobacco abuse: patient noted he quit smoking ~10 days ago; continues on Advair 250/50 BID + Spiriva 81 mcg daily, albuterol HFA PRN; bupropion XL 150 mg daily for tobacco cessation - Receiving Spiriva through [employer] patient assistance through end of 2020. Will need to start the process to reapply for 2021 o Chronic pain: nortriptyline 10 mg QPM; cyclobenzaprine 10 mg daily  o Gout: allopurinol 100 mg daily   Pharmacist Clinical Goal(s):  Marland Kitchen Over the next 90 days, patient will work with PharmD and provider towards optimized medication management  Interventions: . Comprehensive medication review performed; medication list updated in electronic medical record . Contacted [name] to discuss her  concerns w/ Vascepa cost. Left HIPAA compliant message for her to return my call at her convenience. There is not patient assistance for Vascepa, however, could discuss application to [employer] for hyperlipidemia.   Patient Self Care Activities:  . Patient will take medications as prescribed  Initial goal documentation        The patient verbalized understanding of instructions provided today and declined a print copy of patient instruction materials.   Plan: - If I do not hear back from [name], will outreach for continued medication management support in ~4-5 weeks  Catie Darnelle Maffucci, PharmD, [place] [phone]

## 2019-06-13 ENCOUNTER — Ambulatory Visit: Payer: Medicare Other | Admitting: Cardiothoracic Surgery

## 2019-06-16 ENCOUNTER — Other Ambulatory Visit (HOSPITAL_COMMUNITY): Payer: Self-pay

## 2019-06-16 NOTE — Progress Notes (Signed)
Referral placed for nutrition management per Dr Aundra Dubin

## 2019-06-17 ENCOUNTER — Telehealth: Payer: Self-pay | Admitting: Family Medicine

## 2019-06-17 ENCOUNTER — Telehealth: Payer: Self-pay

## 2019-06-17 ENCOUNTER — Ambulatory Visit: Payer: Self-pay | Admitting: Pharmacist

## 2019-06-17 NOTE — Chronic Care Management (AMB) (Signed)
  Chronic Care Management   Outreach Note  06/17/2019 Name: Roberto [name] MRN: [id number] DOB: [birth year]  Referred by: Valerie Roys, DO Reason for referral : Chronic Care Management (CCM outreach LVM to call back to R/S appt 06/17/2019)   Initial unsuccessful outreach attempt was made today to reschedule follow up appointment with care management team member on 06/17/2019.   Follow Up Plan: A HIPPA compliant phone message was left for the patient providing contact information and requesting a return call.  The care management team will reach out to the patient again over the next 7 days.  If patient returns call to provider office, please advise to call [place] at [facility], [facility]  [place], [place] [address] Direct Dial: [email or web address]  Website: [email or web address]

## 2019-06-17 NOTE — Chronic Care Management (AMB) (Signed)
  Chronic Care Management   Note  06/17/2019 Name: Roberto [name] MRN: [id number] DOB: April 22, 1956  Roberto [name] is a 63 y.o. year old male who is a primary care patient of Valerie Roys, DO. The CCM team was consulted for assistance with chronic disease management and care coordination needs.    Attempted to contact patient's caregiver, [name], to discuss medication management needs. Call went straight to voicemail. Left message letting [name] know that I would have my Care Guide outreach to schedule a phone appointment with her and Torrie.   Follow up plan: - Will collaborate w/ Care Guide for future outreach  Catie Darnelle Maffucci, PharmD, [place] [phone]

## 2019-06-18 ENCOUNTER — Telehealth: Payer: Self-pay

## 2019-06-20 NOTE — Progress Notes (Signed)
[address]       [place] [address]             [phone]     CARDIOTHORACIC SURGERY OFFICE NOTE  Referring Provider is Larey Dresser, MD Primary Cardiologist is Nelva Bush, MD PCP is Valerie Roys, DO   HPI:  63 yo man presents for f/u for known asc aortic aneurysm associated with bicuspid aortic valve, which was repaired in [place] several years ago with mechanical aortic valve. He has been seeing heart failure specialists and has responded well. He has also recently stopped smoking, but this has been associated with some weight gain, which he is not happy about. Denies chest pain or back pain.    Current Outpatient Medications  Medication Sig Dispense Refill  . albuterol (PROVENTIL) (2.5 MG/3ML) 0.083% nebulizer solution Take 3 mLs (2.5 mg total) by nebulization every 6 (six) hours as needed for wheezing or shortness of breath. 150 mL 1  . albuterol (VENTOLIN HFA) 108 (90 Base) MCG/ACT inhaler Inhale 2 puffs into the lungs every 6 (six) hours as needed for wheezing or shortness of breath. 19 g 6  . aspirin 81 MG chewable tablet Chew 81 mg by mouth daily.      Marland Kitchen buPROPion (WELLBUTRIN XL) 150 MG 24 hr tablet Take 1 tablet (150 mg total) by mouth daily for 3 days, THEN 1 tablet (150 mg total) 2 (two) times daily. 180 tablet 3  . cyclobenzaprine (FLEXERIL) 10 MG tablet Take 1 tablet (10 mg total) by mouth 3 times/day as needed-between meals & bedtime for muscle spasms. 180 tablet 0  . Fluticasone-Salmeterol (ADVAIR) 250-50 MCG/DOSE AEPB Inhale 1 puff into the lungs as needed. 60 each 6  . furosemide (LASIX) 40 MG tablet Take 1.5 tablets (60 mg total) by mouth 2 (two) times daily. 45 tablet 3  . lisinopril (ZESTRIL) 5 MG tablet Take 5 mg by mouth 2 (two) times daily.    . metoprolol succinate (TOPROL-XL) 100 MG 24 hr tablet TAKE 1 TABLET BY MOUTH ONCE DAILY WITH  OR  IMMEDIATELY  FOLLOWING  A  MEAL 90 tablet 3  . Omega-3 Fatty Acids (FISH OIL) 1000 MG  CPDR Take 2,000 mg by mouth daily. 180 capsule 2  . potassium chloride SA (K-DUR) 20 MEQ tablet Take 1 tablet (20 mEq total) by mouth daily. 90 tablet 3  . SPIRIVA HANDIHALER 18 MCG inhalation capsule PLACE ONE CAPSULE INTO INHALER AND INHALE THE CONTENTS BY MOUTH ONCE DAILY 30 capsule 0  . warfarin (COUMADIN) 4 MG tablet TAKE 1 TABLET BY MOUTH ONCE DAILY AT  6  PM.  TAKE  AS  DIRECTED 90 tablet 3  . warfarin (COUMADIN) 7.5 MG tablet Take 7.5 mg by mouth See admin instructions. 4 mg for 2 days, 7.5 mg on the 3rd day, and repeat    . allopurinol (ZYLOPRIM) 100 MG tablet Take 1 tablet (100 mg total) by mouth daily. 90 tablet 1  . icosapent Ethyl (VASCEPA) 1 g capsule Take 2 capsules (2 g total) by mouth 2 (two) times daily. 120 capsule 5  . nortriptyline (PAMELOR) 10 MG capsule Take 1 capsule (10 mg total) by mouth at bedtime. 90 capsule 1  . rosuvastatin (CRESTOR) 40 MG tablet Take 1 tablet (40 mg total) by mouth daily. 90 tablet 3  . spironolactone (ALDACTONE) 25 MG tablet Take 1 tablet (25 mg total) by mouth daily. 90 tablet 3   No current facility-administered medications  for this visit.      Physical Exam:   BP 131/81   Pulse 96   Temp 97.7 F (36.5 C) (Skin)   Resp 20   Ht 5\' 6"  (1.676 m)   Wt (!) 157.9 kg   SpO2 93% Comment: RA  BMI 56.17 kg/m   General:  Well-appearing, NAD  Chest:   cta  CV:   rrr with mechanical click  Incisions:  n/a  Abdomen:  sntnd  Extremities:  Mild LE edema  Diagnostic Tests:  No new data  Impression:  Pleasant 63 yo man with bicuspid aortic valve syndrome and ascending aortic aneurysm. He is doing well clinically.   Plan:  F/u in 3 months with repeat imaging.  Discuss weight loss strategy and nutrition with heart failure group  I spent in excess of 20 minutes during the conduct of this office consultation and >50% of this time involved direct face-to-face encounter with the patient for counseling and/or coordination of their  care.  Level 2                 10 minutes Level 3                 15 minutes Level 4                 25 minutes Level 5                 40 minutes  B. Murvin Natal, MD 06/20/2019 6:30 AM

## 2019-06-23 ENCOUNTER — Telehealth: Payer: Self-pay | Admitting: Family Medicine

## 2019-06-23 NOTE — Telephone Encounter (Signed)
    Called pt regarding Insurance account manager for [employer], no vm set up   [employer] . Embedded Care Coordination [place]  Care Management ??[email or web address]  ??[phone]

## 2019-06-24 ENCOUNTER — Other Ambulatory Visit: Payer: Self-pay

## 2019-06-24 ENCOUNTER — Telehealth: Payer: Self-pay | Admitting: Family Medicine

## 2019-06-24 ENCOUNTER — Encounter: Payer: Self-pay | Admitting: Family Medicine

## 2019-06-24 ENCOUNTER — Ambulatory Visit (INDEPENDENT_AMBULATORY_CARE_PROVIDER_SITE_OTHER): Payer: Medicare Other | Admitting: Family Medicine

## 2019-06-24 DIAGNOSIS — I2510 Atherosclerotic heart disease of native coronary artery without angina pectoris: Secondary | ICD-10-CM

## 2019-06-24 DIAGNOSIS — J441 Chronic obstructive pulmonary disease with (acute) exacerbation: Secondary | ICD-10-CM

## 2019-06-24 MED ORDER — ALBUTEROL SULFATE (2.5 MG/3ML) 0.083% IN NEBU: 2.5000 mg | INHALATION_SOLUTION | Freq: Four times a day (QID) | RESPIRATORY_TRACT | 1 refills | Status: DC | PRN

## 2019-06-24 MED ORDER — PREDNISONE 50 MG PO TABS: 50.0000 mg | ORAL_TABLET | Freq: Every day | ORAL | 0 refills | Status: DC

## 2019-06-24 MED ORDER — AZITHROMYCIN 250 MG PO TABS: ORAL_TABLET | ORAL | 0 refills | Status: DC

## 2019-06-24 NOTE — Progress Notes (Signed)
There were no vitals taken for this visit.   Subjective:    Patient ID: Roberto [name], male    DOB: 1956-06-29, 63 y.o.   MRN: [id number]  HPI: Roberto [name] is a 63 y.o. male  Chief Complaint  Patient presents with  . Shortness of Breath    X 2 weeks, has been using Advair and Spiriva. Would like a script for neb meds   SHORTNESS OF BREATH- no weight gain, no swelling in his legs, no smoking in 3 weeks.  Duration: 2-3 weeks Onset: gradual Description of breathing discomfort: SOB Severity: moderate Episode duration: minutes to hours  Frequency: daily Related to exertion: yes Cough: yes non-productive Chest tightness: no Wheezing: no Fevers: no Chest pain: no Palpitations: no  Nausea: no Diaphoresis: no Deconditioning: yes Status: worse Treatments attempted: albuterol   Relevant past medical, surgical, family and social history reviewed and updated as indicated. Interim medical history since our last visit reviewed. Allergies and medications reviewed and updated.  Review of Systems  Constitutional: Positive for fatigue. Negative for activity change, appetite change, chills, diaphoresis, fever and unexpected weight change.  HENT: Negative.   Respiratory: Positive for cough and shortness of breath. Negative for apnea, choking, chest tightness, wheezing and stridor.   Cardiovascular: Negative.   Neurological: Negative.   Psychiatric/Behavioral: Negative.     Per HPI unless specifically indicated above     Objective:    There were no vitals taken for this visit.  Wt Readings from Last 3 Encounters:  06/10/19 (!) 348 lb (157.9 kg)  06/09/19 (!) 348 lb (157.9 kg)  06/04/19 (!) 348 lb 12 oz (158.2 kg)    Physical Exam Vitals and nursing note reviewed.  Pulmonary:     Effort: Pulmonary effort is normal. No respiratory distress.     Comments: Speaking in full sentences Neurological:     Mental Status: He is alert.  Psychiatric:        Mood and Affect:  Mood normal.        Behavior: Behavior normal.        Thought Content: Thought content normal.        Judgment: Judgment normal.     Results for orders placed or performed during the hospital encounter of 06/04/19  Hepatic function panel  Result Value Ref Range   Total Protein 7.8 6.5 - 8.1 g/dL   Albumin 4.1 3.5 - 5.0 g/dL   AST 19 15 - 41 U/L   ALT 18 0 - 44 U/L   Alkaline Phosphatase 46 38 - 126 U/L   Total Bilirubin 0.6 0.3 - 1.2 mg/dL   Bilirubin, Direct <0.1 0.0 - 0.2 mg/dL   Indirect Bilirubin NOT CALCULATED 0.3 - 0.9 mg/dL  Lipid Profile  Result Value Ref Range   Cholesterol 219 (H) 0 - 200 mg/dL   Triglycerides 559 (H) <150 mg/dL   HDL 40 (L) >40 mg/dL   Total CHOL/HDL Ratio 5.5 RATIO   VLDL UNABLE TO CALCULATE IF TRIGLYCERIDE OVER 400 mg/dL 0 - 40 mg/dL   LDL Cholesterol UNABLE TO CALCULATE IF TRIGLYCERIDE OVER 400 mg/dL 0 - 99 mg/dL  Basic Metabolic Panel (BMET)  Result Value Ref Range   Sodium 137 135 - 145 mmol/L   Potassium 5.1 3.5 - 5.1 mmol/L   Chloride 103 98 - 111 mmol/L   CO2 25 22 - 32 mmol/L   Glucose, Bld 162 (H) 70 - 99 mg/dL   BUN 35 (H) 8 - 23 mg/dL   Creatinine,  Ser 1.41 (H) 0.61 - 1.24 mg/dL   Calcium 9.0 8.9 - 10.3 mg/dL   GFR calc non Af Amer 52 (L) >60 mL/min   GFR calc Af Amer >60 >60 mL/min   Anion gap 9 5 - 15  LDL cholesterol, direct  Result Value Ref Range   Direct LDL 96.4 0 - 99 mg/dL      Assessment & Plan:   Problem List Items Addressed This Visit    None    Visit Diagnoses    COPD exacerbation ([place])    -  Primary   Continue inhalers. Start prednisone and azithromycin. Call with any concerns or if not improving.    Relevant Medications   albuterol (PROVENTIL) (2.5 MG/3ML) 0.083% nebulizer solution   predniSONE (DELTASONE) 50 MG tablet   azithromycin (ZITHROMAX) 250 MG tablet       Follow up plan: Return if symptoms worsen or fail to improve.    . This visit was completed via telephone due to the restrictions of the  COVID-19 pandemic. All issues as above were discussed and addressed but no physical exam was performed. If it was felt that the patient should be evaluated in the office, they were directed there. The patient verbally consented to this visit. Patient was unable to complete an audio/visual visit due to Lack of equipment. Due to the catastrophic nature of the COVID-19 pandemic, this visit was done through audio contact only. . Location of the patient: home . Location of the provider: work . Those involved with this call:  . Provider: Park Liter, DO . CMA: Tiffany Reel, CMA . Front Desk/Registration: Don Perking  . Time spent on call: 15 minutes on the phone discussing health concerns. 23 minutes total spent in review of patient's record and preparation of their chart.

## 2019-06-24 NOTE — Telephone Encounter (Signed)
Called kim scheduled [place] for 3:15

## 2019-06-24 NOTE — Telephone Encounter (Signed)
Needs appointment

## 2019-06-24 NOTE — Telephone Encounter (Signed)
Called kim to schedule f/u appt, she states that pt is having a hard time breathing and is asking for an inhaler sent to the pharmacy. Advised albuterol was filled in October with 6 refills. She states that she will contact the pharmacy.

## 2019-06-24 NOTE — Chronic Care Management (AMB) (Signed)
  Care Management   Note  06/24/2019 Name: Roberto [name] MRN: [id number] DOB: [birth year]  Theartis Sweezey is a 63 y.o. year old male who is a primary care patient of Valerie Roys, DO and is actively engaged with the care management team. I reached out to Salley Slaughter by phone today to assist with scheduling a follow up appointment with the Pharmacist  Follow up plan: Telephone appointment with CCM team member scheduled for: 08/01/2019  [place], [facility]  [place], [place] [address] Direct Dial: [email or web address]  Website: [email or web address]

## 2019-06-27 ENCOUNTER — Encounter: Payer: Self-pay | Admitting: Family Medicine

## 2019-06-27 LAB — POCT INR

## 2019-06-30 NOTE — Telephone Encounter (Signed)
    Called pt regarding Roberto [name] Referral for assistance with medical bills, pt stated that I should speak with Roberto [name] directly as she handles the finances and is his caregiver. Left my contact information with him for her to call me back  [place] . Embedded Care Coordination [place]  Care Management ??[email or web address]  ??[phone]

## 2019-07-01 LAB — POCT INR

## 2019-07-02 ENCOUNTER — Ambulatory Visit: Payer: Medicare Other | Admitting: Dietician

## 2019-07-02 ENCOUNTER — Encounter: Payer: Self-pay | Admitting: Family Medicine

## 2019-07-02 LAB — POCT INR

## 2019-07-11 ENCOUNTER — Encounter: Payer: Self-pay | Admitting: Family Medicine

## 2019-07-11 LAB — POCT INR

## 2019-07-14 ENCOUNTER — Ambulatory Visit (INDEPENDENT_AMBULATORY_CARE_PROVIDER_SITE_OTHER): Payer: Medicare Other

## 2019-07-14 DIAGNOSIS — Z1211 Encounter for screening for malignant neoplasm of colon: Secondary | ICD-10-CM | POA: Diagnosis not present

## 2019-07-14 DIAGNOSIS — Z0000 Encounter for general adult medical examination without abnormal findings: Secondary | ICD-10-CM | POA: Diagnosis not present

## 2019-07-14 NOTE — Addendum Note (Signed)
Addended by: Tyler Aas A on: 07/14/2019 01:48 PM   Modules accepted: Orders

## 2019-07-14 NOTE — Patient Instructions (Signed)
Roberto [name] , Thank you for taking time to come for your Medicare Wellness Visit. I appreciate your ongoing commitment to your health goals. Please review the following plan we discussed and let me know if I can assist you in the future.   Screening recommendations/referrals: Colonoscopy: declined  Recommended yearly ophthalmology/optometry visit for glaucoma screening and checkup Recommended yearly dental visit for hygiene and checkup  Vaccinations: Influenza vaccine: declined  Pneumococcal vaccine: due at age 65 Tdap vaccine: up to date Shingles vaccine: shingrix eligible    Advanced directives: please pick up a copy of this information next time you are in the office.   Conditions/risks identified: diabetic  Next appointment: Follow up in one year for your annual wellness visit   Preventive Care 40-64 Years, Male Preventive care refers to lifestyle choices and visits with your health care provider that can promote health and wellness. What does preventive care include?  A yearly physical exam. This is also called an annual well check.  Dental exams once or twice a year.  Routine eye exams. Ask your health care provider how often you should have your eyes checked.  Personal lifestyle choices, including:  Daily care of your teeth and gums.  Regular physical activity.  Eating a healthy diet.  Avoiding tobacco and drug use.  Limiting alcohol use.  Practicing safe sex.  Taking low-dose aspirin every day starting at age 65 What happens during an annual well check? The services and screenings done by your health care provider during your annual well check will depend on your age, overall health, lifestyle risk factors, and family history of disease. Counseling  Your health care provider may ask you questions about your:  Alcohol use.  Tobacco use.  Drug use.  Emotional well-being.  Home and relationship well-being.  Sexual activity.  Eating  habits.  Work and work Statistician. Screening  You may have the following tests or measurements:  Height, weight, and BMI.  Blood pressure.  Lipid and cholesterol levels. These may be checked every 5 years, or more frequently if you are over 58 years old.  Skin check.  Lung cancer screening. You may have this screening every year starting at age 68 if you have a 30-pack-year history of smoking and currently smoke or have quit within the past 15 years.  Fecal occult blood test (FOBT) of the stool. You may have this test every year starting at age 68.  Flexible sigmoidoscopy or colonoscopy. You may have a sigmoidoscopy every 5 years or a colonoscopy every 10 years starting at age 72.  Prostate cancer screening. Recommendations will vary depending on your family history and other risks.  Hepatitis C blood test.  Hepatitis B blood test.  Sexually transmitted disease (STD) testing.  Diabetes screening. This is done by checking your blood sugar (glucose) after you have not eaten for a while (fasting). You may have this done every 1-3 years. Discuss your test results, treatment options, and if necessary, the need for more tests with your health care provider. Vaccines  Your health care provider may recommend certain vaccines, such as:  Influenza vaccine. This is recommended every year.  Tetanus, diphtheria, and acellular pertussis (Tdap, Td) vaccine. You may need a Td booster every 10 years.  Zoster vaccine. You may need this after age 40.  Pneumococcal 13-valent conjugate (PCV13) vaccine. You may need this if you have certain conditions and have not been vaccinated.  Pneumococcal polysaccharide (PPSV23) vaccine. You may need one or two doses if  you smoke cigarettes or if you have certain conditions. Talk to your health care provider about which screenings and vaccines you need and how often you need them. This information is not intended to replace advice given to you by your  health care provider. Make sure you discuss any questions you have with your health care provider. Document Released: 07/16/2015 Document Revised: 03/08/2016 Document Reviewed: 04/20/2015 Elsevier Interactive Patient Education  2017 [place] Prevention in the Home Falls can cause injuries. They can happen to people of all ages. There are many things you can do to make your home safe and to help prevent falls. What can I do on the outside of my home?  Regularly fix the edges of walkways and driveways and fix any cracks.  Remove anything that might make you trip as you walk through a door, such as a raised step or threshold.  Trim any bushes or trees on the path to your home.  Use bright outdoor lighting.  Clear any walking paths of anything that might make someone trip, such as rocks or tools.  Regularly check to see if handrails are loose or broken. Make sure that both sides of any steps have handrails.  Any raised decks and porches should have guardrails on the edges.  Have any leaves, snow, or ice cleared regularly.  Use sand or salt on walking paths during winter.  Clean up any spills in your garage right away. This includes oil or grease spills. What can I do in the bathroom?  Use night lights.  Install grab bars by the toilet and in the tub and shower. Do not use towel bars as grab bars.  Use non-skid mats or decals in the tub or shower.  If you need to sit down in the shower, use a plastic, non-slip stool.  Keep the floor dry. Clean up any water that spills on the floor as soon as it happens.  Remove soap buildup in the tub or shower regularly.  Attach bath mats securely with double-sided non-slip rug tape.  Do not have throw rugs and other things on the floor that can make you trip. What can I do in the bedroom?  Use night lights.  Make sure that you have a light by your bed that is easy to reach.  Do not use any sheets or blankets that are too big  for your bed. They should not hang down onto the floor.  Have a firm chair that has side arms. You can use this for support while you get dressed.  Do not have throw rugs and other things on the floor that can make you trip. What can I do in the kitchen?  Clean up any spills right away.  Avoid walking on wet floors.  Keep items that you use a lot in easy-to-reach places.  If you need to reach something above you, use a strong step stool that has a grab bar.  Keep electrical cords out of the way.  Do not use floor polish or wax that makes floors slippery. If you must use wax, use non-skid floor wax.  Do not have throw rugs and other things on the floor that can make you trip. What can I do with my stairs?  Do not leave any items on the stairs.  Make sure that there are handrails on both sides of the stairs and use them. Fix handrails that are broken or loose. Make sure that handrails are as long as  the stairways.  Check any carpeting to make sure that it is firmly attached to the stairs. Fix any carpet that is loose or worn.  Avoid having throw rugs at the top or bottom of the stairs. If you do have throw rugs, attach them to the floor with carpet tape.  Make sure that you have a light switch at the top of the stairs and the bottom of the stairs. If you do not have them, ask someone to add them for you. What else can I do to help prevent falls?  Wear shoes that:  Do not have high heels.  Have rubber bottoms.  Are comfortable and fit you well.  Are closed at the toe. Do not wear sandals.  If you use a stepladder:  Make sure that it is fully opened. Do not climb a closed stepladder.  Make sure that both sides of the stepladder are locked into place.  Ask someone to hold it for you, if possible.  Clearly mark and make sure that you can see:  Any grab bars or handrails.  First and last steps.  Where the edge of each step is.  Use tools that help you move around  (mobility aids) if they are needed. These include:  Canes.  Walkers.  Scooters.  Crutches.  Turn on the lights when you go into a dark area. Replace any light bulbs as soon as they burn out.  Set up your furniture so you have a clear path. Avoid moving your furniture around.  If any of your floors are uneven, fix them.  If there are any pets around you, be aware of where they are.  Review your medicines with your doctor. Some medicines can make you feel dizzy. This can increase your chance of falling. Ask your doctor what other things that you can do to help prevent falls. This information is not intended to replace advice given to you by your health care provider. Make sure you discuss any questions you have with your health care provider. Document Released: 04/15/2009 Document Revised: 11/25/2015 Document Reviewed: 07/24/2014 Elsevier Interactive Patient Education  2017 [employer]..

## 2019-07-14 NOTE — Progress Notes (Signed)
Subjective:   Roberto [name] is a 64 y.o. male who presents for Medicare Annual/Subsequent preventive examination.  This visit is being conducted via phone call  - after an attmept to do on video chat - due to the COVID-19 pandemic. This patient has given me verbal consent via phone to conduct this visit, patient states they are participating from their home address. Some vital signs may be absent or patient reported.   Patient identification: identified by name, DOB, and current address.    Review of Systems:   Cardiac Risk Factors include: advanced age (>93men, >66 women);male gender;dyslipidemia;hypertension;smoking/ tobacco exposure     Objective:    Vitals: There were no vitals taken for this visit.  There is no height or weight on file to calculate BMI.  Advanced Directives 07/14/2019 01/28/2019 01/25/2019 11/04/2018 07/11/2018 07/27/2017 07/06/2017  Does Patient Have a Medical Advance Directive? No No No No No No No  Does patient want to make changes to medical advance directive? - - - - - No - Patient declined Yes (MAU/Ambulatory/Procedural Areas - Information given)  Would patient like information on creating a medical advance directive? - No - Patient declined - No - Patient declined Yes (MAU/Ambulatory/Procedural Areas - Information given) No - Patient declined -    Tobacco Social History   Tobacco Use  Smoking Status Former Smoker  . Packs/day: 0.50  . Years: 46.00  . Pack years: 23.00  . Types: Cigarettes  . Start date: 07/16/2017  . Quit date: 05/11/2019  . Years since quitting: 0.1  Smokeless Tobacco Never Used     Counseling given: Not Answered   Clinical Intake:  Pre-visit preparation completed: Yes  Pain : No/denies pain     Nutritional Risks: None Diabetes: Yes  How often do you need to have someone help you when you read instructions, pamphlets, or other written materials from your [name] or pharmacy?: 1 - Never  Interpreter Needed?: No  Information  entered by :: Marquavis Hannen,LPN  Past Medical History:  Diagnosis Date  . Bicuspid aortic valve    a. s/p #27 Carbomedics mechanical valve on 03/25/2010; b. on Coumadin; c. TTE 12/17: EF 40-45%, moderately dilated LV with moderate LVH, AVR well-seated with 14 mmHg gradient, peak AV velocity 2.5 m/s, mild mitral valve thickening with mild MR, mildly dilated RV with mildly reduced contraction  . Cellulitis   . Chronic systolic CHF (congestive heart failure) ([place])    a. R/LHC 03/2010 showed no significant CAD, LVEDP 31 mmHg, mean AoV gradient 34 mmHg at rest and 47 mmHg with dobutamine 20 mcg/kg/min, AVA 1.0 cm^2, RA 31, RV 68/25, PA 68/47, PCWP 38. PA sat 65%. CO 6.2 L/min ([place]) and 5.3 L/min (thermodilution)  . Clotting disorder ([place])   . H/O mechanical aortic valve replacement 03/25/2010   a. #27 Carbomedics mechanical valve  . Hypercholesterolemia   . Renal infarct [facility]) 2017   Multiple right renal infarcts, likely embolic.  . Stroke ([place])   . TIA (transient ischemic attack) 05/2014   Past Surgical History:  Procedure Laterality Date  . AORTIC VALVE REPLACEMENT    . CARDIAC CATHETERIZATION  03/21/2010   No significant CAD. Severe aortic stenosis. Severely elevated left and right heart filling pressures.  Marland Kitchen CARDIAC SURGERY  2009   CHF  . CARPAL TUNNEL RELEASE Left 2005  . RIGHT HEART CATH AND CORONARY ANGIOGRAPHY N/A 01/28/2019   Procedure: RIGHT HEART CATH AND CORONARY ANGIOGRAPHY;  Surgeon: Nelva Bush, MD;  Location: [facility];  Service: Cardiovascular;  Laterality: N/A;  . TONSILLECTOMY  1962   Family History  Problem Relation Age of Onset  . Arthritis Mother   . Dementia Mother   . Colon cancer Mother   . Arthritis Father   . Diabetes Father   . Stroke Father   . Colon cancer Father   . Heart attack Brother   . Breast cancer Sister   . Seizures Sister   . Cancer Brother        brain  . Heart disease Brother   . Heart attack Brother    Social History    Socioeconomic History  . Marital status: Single    Spouse name: Not on file  . Number of children: 1  . Years of education: 4  . Highest education level: Associate degree: academic program  Occupational History  . Occupation: Disabled    Employer: UNEMPLOYED  Tobacco Use  . Smoking status: Former Smoker    Packs/day: 0.50    Years: 46.00    Pack years: 23.00    Types: Cigarettes    Start date: 07/16/2017    Quit date: 05/11/2019    Years since quitting: 0.1  . Smokeless tobacco: Never Used  Substance and Sexual Activity  . Alcohol use: No    Alcohol/week: 0.0 standard drinks  . Drug use: No  . Sexual activity: Not Currently  Other Topics Concern  . Not on file  Social History Narrative  . Not on file   Social Determinants of Health   Financial Resource Strain:   . Difficulty of Paying Living Expenses: Not on file  Food Insecurity:   . Worried About Charity fundraiser in the Last Year: Not on file  . Ran Out of Food in the Last Year: Not on file  Transportation Needs:   . Lack of Transportation (Medical): Not on file  . Lack of Transportation (Non-Medical): Not on file  Physical Activity:   . Days of Exercise per Week: Not on file  . Minutes of Exercise per Session: Not on file  Stress:   . Feeling of Stress : Not on file  Social Connections:   . Frequency of Communication with Friends and Family: Not on file  . Frequency of Social Gatherings with Friends and Family: Not on file  . Attends Religious Services: Not on file  . Active Member of Clubs or Organizations: Not on file  . Attends Archivist Meetings: Not on file  . Marital Status: Not on file    Outpatient Encounter Medications as of 07/14/2019  Medication Sig  . albuterol (PROVENTIL) (2.5 MG/3ML) 0.083% nebulizer solution Take 3 mLs (2.5 mg total) by nebulization every 6 (six) hours as needed for wheezing or shortness of breath.  Marland Kitchen albuterol (VENTOLIN HFA) 108 (90 Base) MCG/ACT inhaler Inhale  2 puffs into the lungs every 6 (six) hours as needed for wheezing or shortness of breath.  . allopurinol (ZYLOPRIM) 100 MG tablet Take 1 tablet (100 mg total) by mouth daily.  Marland Kitchen aspirin 81 MG chewable tablet Chew 81 mg by mouth daily.    . cyclobenzaprine (FLEXERIL) 10 MG tablet Take 1 tablet (10 mg total) by mouth 3 times/day as needed-between meals & bedtime for muscle spasms.  . Fluticasone-Salmeterol (ADVAIR) 250-50 MCG/DOSE AEPB Inhale 1 puff into the lungs as needed.  . furosemide (LASIX) 40 MG tablet Take 1.5 tablets (60 mg total) by mouth 2 (two) times daily.  Marland Kitchen icosapent Ethyl (VASCEPA) 1 g capsule Take  2 capsules (2 g total) by mouth 2 (two) times daily.  Marland Kitchen lisinopril (ZESTRIL) 5 MG tablet Take 5 mg by mouth 2 (two) times daily.  . metoprolol succinate (TOPROL-XL) 100 MG 24 hr tablet TAKE 1 TABLET BY MOUTH ONCE DAILY WITH  OR  IMMEDIATELY  FOLLOWING  A  MEAL  . nortriptyline (PAMELOR) 10 MG capsule Take 1 capsule (10 mg total) by mouth at bedtime.  . Omega-3 Fatty Acids (FISH OIL) 1000 MG CPDR Take 2,000 mg by mouth daily.  . potassium chloride SA (K-DUR) 20 MEQ tablet Take 1 tablet (20 mEq total) by mouth daily.  . predniSONE (DELTASONE) 50 MG tablet Take 1 tablet (50 mg total) by mouth daily with breakfast.  . rosuvastatin (CRESTOR) 40 MG tablet Take 1 tablet (40 mg total) by mouth daily.  Marland Kitchen SPIRIVA HANDIHALER 18 MCG inhalation capsule PLACE ONE CAPSULE INTO INHALER AND INHALE THE CONTENTS BY MOUTH ONCE DAILY  . spironolactone (ALDACTONE) 25 MG tablet Take 1 tablet (25 mg total) by mouth daily.  Marland Kitchen warfarin (COUMADIN) 4 MG tablet TAKE 1 TABLET BY MOUTH ONCE DAILY AT  6  PM.  TAKE  AS  DIRECTED  . warfarin (COUMADIN) 7.5 MG tablet Take 7.5 mg by mouth See admin instructions. 4 mg for 2 days, 7.5 mg on the 3rd day, and repeat  . buPROPion (WELLBUTRIN XL) 150 MG 24 hr tablet Take 1 tablet (150 mg total) by mouth daily for 3 days, THEN 1 tablet (150 mg total) 2 (two) times daily.  .  [DISCONTINUED] azithromycin (ZITHROMAX) 250 MG tablet 2 tabs today,t hen 1 tab daily for 4 days- Dx: COPD exacerbation   No facility-administered encounter medications on file as of 07/14/2019.    Activities of Daily Living In your present state of health, do you have any difficulty performing the following activities: 07/14/2019 01/28/2019  Hearing? Y N  Comment no hearing aids -  Vision? N N  Comment no glassses, no eye dr -  Difficulty concentrating or making decisions? Y N  Walking or climbing stairs? Y N  Comment sob -  Dressing or bathing? N N  Doing errands, shopping? Y -  Conservation officer, nature and eating ? N -  Using the Toilet? N -  In the past six months, have you accidently leaked urine? N -  Do you have problems with loss of bowel control? N -  Managing your Medications? Y -  Comment friend does -  Managing your Finances? N -  Housekeeping or managing your Housekeeping? N -  Some recent data might be hidden    Patient Care Team: Valerie Roys, DO as PCP - General (Family Medicine) End, Harrell Gave, MD as PCP - Cardiology (Cardiology) De Hollingshead, [facility] as Pharmacist (Pharmacist) Greg Cutter, LCSW as Social Worker (Licensed Clinical Social Worker)   Assessment:   This is a routine wellness examination for Roberto [name].  Exercise Activities and Dietary recommendations Current Exercise Habits: The patient does not participate in regular exercise at present, Exercise limited by: None identified  Goals    . DIET - INCREASE WATER INTAKE     Recommend drinking at least 6-8 glasses of water a day     . Exercise 3x per week (30 min per time)     Recommend starting a routine exercise program at least 3 days a week for 30-45 minutes at a time as tolerated.      Marland Kitchen PharmD "We are having a hard time affording his medications" (  pt-stated)     Current Barriers:  . Polypharmacy; complex patient with multiple comorbidities including HFrEF, aortic stenosis, atherosclerosis,  T2DM, COPD d/t tobacco abuse, gout . Caregiver, [name], manages his medications . Financial barriers - patient has applied for Medicaid, but has been denied and [name] notes that hearing didn't go well. Collaboratively applied for Medicare Extra Help on 04/10/2019.  o HFrEF (40-45%); followed by Dr. Saunders Revel, seen in [facility] by Aundra Dubin o - furoesmide 60 mg BID, spironolactone 25 mg daily, lisinopril 5 mg daily, metoprolol succinate 100 mg daily; potassium 20 mEq daily  - Lisinopril last filled 01/03/2019 for 90 day supply o ASCVD risk reduction: rosuvastatin 40 mg daily - just increased d/t LDL of 93, TG >500; goal LDL <70. Vascepa 2 g BID also added; received call from [name] that [name] is too expensive on his insurance; ASA 81 mg daily - Noted to cardiology that he stopped smoking ~10 days ago o Aortic aneurysm; following w/ CT surgery to schedule repair o S/p mechanical AVR; warfarin daily to maintain INR 2-3 o COPD/tobacco abuse: patient noted he quit smoking ~10 days ago; continues on Advair 250/50 BID + Spiriva 81 mcg daily, albuterol HFA PRN; bupropion XL 150 mg daily for tobacco cessation - Receiving Spiriva through [employer] patient assistance through end of 2020. Will need to start the process to reapply for 2021 o Chronic pain: nortriptyline 10 mg QPM; cyclobenzaprine 10 mg daily  o Gout: allopurinol 100 mg daily   Pharmacist Clinical Goal(s):  Marland Kitchen Over the next 90 days, patient will work with PharmD and provider towards optimized medication management  Interventions: . Comprehensive medication review performed; medication list updated in electronic medical record . Contacted [name] to discuss her concerns w/ Vascepa cost. Left HIPAA compliant message for her to return my call at her convenience. There is not patient assistance for Vascepa, however, could discuss application to [employer] for hyperlipidemia.   Patient Self Care Activities:  . Patient will take medications as  prescribed  Initial goal documentation     . SW-'We need to get [place] approved for Medicaid" (pt-stated)     Current Barriers:  . Financial constraints related to managing health care expenses . Limited social support . Level of care concerns . ADL IADL limitations . Social Isolation . Limited access to caregiver  Clinical Social Work Clinical Goal(s):  Marland Kitchen Over the next 120 days, patient will work with SW to address concerns related to gaining Adult Medicaid for patient in order to alleviate financial strain  Interventions: . Patient interviewed and appropriate assessments performed . Provided patient with information about Medicaid enrollment and strict enrollment guidelines  . Discussed plans with patient for ongoing care management follow up and provided patient with direct contact information for care management team . Advised patient to work on gathering all of pt's medical bills in order to get him approved for Medicaid. Patient has been trying to apply for Medicaid since February of [id number] and his application was lost. Patient completed an additional application in August of 2020 but was denied services because patient did not reach the Medical Bill amount needed to get approved for Medicaid. Friend reports that several of these medical bills were lost when his first Medicaid application went missing in February of this year.  Marland Kitchen LCSW left voice message with Medicaid casework [name] at [phone].  Marland Kitchen LCSW updated CCM Pharmacist and provided this contact information as well ^. . Assisted patient/caregiver with obtaining information about health  plan benefits . Provided education and assistance to client regarding Advanced Directives. . Provided education to patient/caregiver regarding level of care options.  Patient Self Care Activities:  . Attends all scheduled provider appointments . Unable to independently afford medical expenses and was declined Medicaid  Initial goal  documentation        Fall Risk: Fall Risk  07/14/2019 10/22/2018 07/11/2018 04/03/2018 07/06/2017  Falls in the past year? 0 0 0 No No  Number falls in past yr: 0 0 0 - -  Injury with Fall? 0 0 - - -    FALL RISK PREVENTION PERTAINING TO THE HOME:  Any stairs in or around the home? Yes  If so, are there any without handrails? No   Home free of loose throw rugs in walkways, pet beds, electrical cords, etc? Yes  Adequate lighting in your home to reduce risk of falls? Yes   ASSISTIVE DEVICES UTILIZED TO PREVENT FALLS:  Life alert? No  Use of a cane, walker or w/c? No  Grab bars in the bathroom? No  Shower chair or bench in shower? No  Elevated toilet seat or a handicapped toilet? No   TIMED UP AND GO:  Unable to perform   Depression Screen PHQ 2/9 Scores 07/14/2019 07/11/2018 07/06/2017 05/17/2017  PHQ - 2 Score 1 1 2  0  PHQ- 9 Score - - 2 -    Cognitive Function     6CIT Screen 07/11/2018 07/06/2017  What Year? 0 points 0 points  What month? 0 points 0 points  What time? 0 points 0 points  Count back from 20 0 points 0 points  Months in reverse 0 points 0 points  Repeat phrase 0 points 0 points  Total Score 0 0    Immunization History  Administered Date(s) Administered  . Influenza,inj,Quad PF,6+ Mos 04/19/2016, 05/17/2017, 04/03/2018  . Pneumococcal Polysaccharide-23 06/09/2014  . Tdap 06/09/2014    Qualifies for Shingles Vaccine? Yes  Zostavax completed n/a. Due for Shingrix. Education has been provided regarding the importance of this vaccine. Pt has been advised to call insurance company to determine out of pocket expense. Advised may also receive vaccine at local pharmacy or Health Dept. Verbalized acceptance and understanding.  Tdap: up to date  Flu Vaccine: declined   Pneumococcal Vaccine: not indicated, discussed due at age 70   Screening Tests Health Maintenance  Topic Date Due  . FOOT EXAM  04/19/2017  . OPHTHALMOLOGY EXAM  06/01/2017  . HEMOGLOBIN A1C   01/23/2019  . HIV Screening  09/10/2019 (Originally 08/25/1970)  . INFLUENZA VACCINE  10/01/2019 (Originally 02/01/2019)  . COLONOSCOPY  11/04/2019 (Originally 08/25/2004)  . TETANUS/TDAP  06/09/2024  . Hepatitis C Screening  Addressed   Cancer Screenings:  Colorectal Screening: refuses colonoscopy. Patient agress to completing cologuard, Although patient does have family history PCP agrees cologuard is best option since he refuses colonoscopy. Ordered.   Lung Cancer Screening: (Low Dose CT Chest recommended if Age 22-80 years, 30 pack-year currently smoking OR have quit w/in 15years.) does qualify.   Last completed 03/2016  Additional Screening:  Hepatitis C Screening: does qualify; Completed 2016  Vision Screening: Recommended annual ophthalmology exams for early detection of glaucoma and other disorders of the eye. Is the patient up to date with their annual eye exam?  No   Dental Screening: Recommended annual dental exams for proper oral hygiene  Community Resource Referral:  CRR required this visit?  No        Plan:  I  have personally reviewed and addressed the Medicare Annual Wellness questionnaire and have noted the following in the patient's chart:  A. Medical and social history B. Use of alcohol, tobacco or illicit drugs  C. Current medications and supplements D. Functional ability and status E.  Nutritional status F.  Physical activity G. Advance directives H. List of other physicians I.  Hospitalizations, surgeries, and ER visits in previous 12 months J.  [place] such as hearing and vision if needed, cognitive and depression L. Referrals and appointments   In addition, I have reviewed and discussed with patient certain preventive protocols, quality metrics, and best practice recommendations. A written personalized care plan for preventive services as well as general preventive health recommendations were provided to patient.   Signed,   Bevelyn Ngo, LPN  [id number] Nurse Health Advisor   Nurse Notes: patient hasnt been able to obtain CPAP machine due to financial concerns. States it wasn't being covered under medicare.  Patient is already under care of social worker and working on getting on [employer] as well.

## 2019-07-19 ENCOUNTER — Encounter: Payer: Self-pay | Admitting: Family Medicine

## 2019-07-19 LAB — POCT INR

## 2019-07-22 ENCOUNTER — Ambulatory Visit: Payer: Medicare Other | Admitting: Family Medicine

## 2019-07-28 LAB — POCT INR: INR: 2.1 (ref 2.0–3.0)

## 2019-07-29 ENCOUNTER — Telehealth: Payer: Self-pay

## 2019-08-05 LAB — POCT INR: INR: 2.3 (ref 2.0–3.0)

## 2019-08-06 ENCOUNTER — Other Ambulatory Visit
Admission: RE | Admit: 2019-08-06 | Discharge: 2019-08-06 | Disposition: A | Discharge status: Home / Self Care | Payer: Medicare Other | Source: Ambulatory Visit | Attending: Family Medicine | Admitting: Family Medicine

## 2019-08-06 DIAGNOSIS — M1A9XX0 Chronic gout, unspecified, without tophus (tophi): Secondary | ICD-10-CM | POA: Diagnosis not present

## 2019-08-06 LAB — URIC ACID: Uric Acid, Serum: 8.8 mg/dL — ABNORMAL HIGH (ref 3.7–8.6)

## 2019-08-06 NOTE — Addendum Note (Signed)
Addended by: Santiago Bur on: 08/06/2019 12:04 PM   Modules accepted: Orders

## 2019-08-07 ENCOUNTER — Telehealth (HOSPITAL_COMMUNITY): Payer: Self-pay

## 2019-08-07 ENCOUNTER — Other Ambulatory Visit: Payer: Self-pay | Admitting: Cardiothoracic Surgery

## 2019-08-07 DIAGNOSIS — I712 Thoracic aortic aneurysm, without rupture, unspecified: Secondary | ICD-10-CM

## 2019-08-07 DIAGNOSIS — I7121 Aneurysm of the ascending aorta, without rupture: Secondary | ICD-10-CM

## 2019-08-07 NOTE — Telephone Encounter (Signed)

## 2019-08-08 ENCOUNTER — Other Ambulatory Visit: Payer: Self-pay

## 2019-08-08 ENCOUNTER — Encounter (HOSPITAL_COMMUNITY): Payer: Self-pay | Admitting: Cardiology

## 2019-08-08 ENCOUNTER — Ambulatory Visit (HOSPITAL_COMMUNITY)
Admission: RE | Admit: 2019-08-08 | Discharge: 2019-08-08 | Disposition: A | Discharge status: Home / Self Care | Payer: Medicare Other | Source: Ambulatory Visit | Attending: Cardiology | Admitting: Cardiology

## 2019-08-08 VITALS — BP 122/70 | HR 76 | Wt 350.6 lb

## 2019-08-08 DIAGNOSIS — E785 Hyperlipidemia, unspecified: Secondary | ICD-10-CM | POA: Diagnosis not present

## 2019-08-08 DIAGNOSIS — Z7952 Long term (current) use of systemic steroids: Secondary | ICD-10-CM | POA: Insufficient documentation

## 2019-08-08 DIAGNOSIS — G4739 Other sleep apnea: Secondary | ICD-10-CM | POA: Diagnosis not present

## 2019-08-08 DIAGNOSIS — Z6841 Body Mass Index (BMI) 40.0 and over, adult: Secondary | ICD-10-CM | POA: Diagnosis not present

## 2019-08-08 DIAGNOSIS — Z7982 Long term (current) use of aspirin: Secondary | ICD-10-CM | POA: Insufficient documentation

## 2019-08-08 DIAGNOSIS — Z8673 Personal history of transient ischemic attack (TIA), and cerebral infarction without residual deficits: Secondary | ICD-10-CM | POA: Diagnosis not present

## 2019-08-08 DIAGNOSIS — Z87891 Personal history of nicotine dependence: Secondary | ICD-10-CM | POA: Insufficient documentation

## 2019-08-08 DIAGNOSIS — I712 Thoracic aortic aneurysm, without rupture: Secondary | ICD-10-CM | POA: Diagnosis not present

## 2019-08-08 DIAGNOSIS — G8929 Other chronic pain: Secondary | ICD-10-CM

## 2019-08-08 DIAGNOSIS — M545 Low back pain, unspecified: Secondary | ICD-10-CM

## 2019-08-08 DIAGNOSIS — N280 Ischemia and infarction of kidney: Secondary | ICD-10-CM

## 2019-08-08 DIAGNOSIS — Z7901 Long term (current) use of anticoagulants: Secondary | ICD-10-CM | POA: Insufficient documentation

## 2019-08-08 DIAGNOSIS — Z79899 Other long term (current) drug therapy: Secondary | ICD-10-CM | POA: Insufficient documentation

## 2019-08-08 DIAGNOSIS — I428 Other cardiomyopathies: Secondary | ICD-10-CM | POA: Diagnosis not present

## 2019-08-08 DIAGNOSIS — I7121 Aneurysm of the ascending aorta, without rupture: Secondary | ICD-10-CM

## 2019-08-08 DIAGNOSIS — E669 Obesity, unspecified: Secondary | ICD-10-CM | POA: Insufficient documentation

## 2019-08-08 DIAGNOSIS — I5022 Chronic systolic (congestive) heart failure: Secondary | ICD-10-CM

## 2019-08-08 DIAGNOSIS — G4733 Obstructive sleep apnea (adult) (pediatric): Secondary | ICD-10-CM | POA: Insufficient documentation

## 2019-08-08 DIAGNOSIS — Z7951 Long term (current) use of inhaled steroids: Secondary | ICD-10-CM | POA: Diagnosis not present

## 2019-08-08 DIAGNOSIS — Z952 Presence of prosthetic heart valve: Secondary | ICD-10-CM | POA: Diagnosis not present

## 2019-08-08 DIAGNOSIS — Z8249 Family history of ischemic heart disease and other diseases of the circulatory system: Secondary | ICD-10-CM | POA: Insufficient documentation

## 2019-08-08 DIAGNOSIS — I2510 Atherosclerotic heart disease of native coronary artery without angina pectoris: Secondary | ICD-10-CM | POA: Diagnosis not present

## 2019-08-08 DIAGNOSIS — Z560 Unemployment, unspecified: Secondary | ICD-10-CM | POA: Diagnosis not present

## 2019-08-08 LAB — BASIC METABOLIC PANEL
Anion gap: 13 (ref 5–15)
BUN: 26 mg/dL — ABNORMAL HIGH (ref 8–23)
CO2: 21 mmol/L — ABNORMAL LOW (ref 22–32)
Calcium: 9.5 mg/dL (ref 8.9–10.3)
Chloride: 101 mmol/L (ref 98–111)
Creatinine, Ser: 1.43 mg/dL — ABNORMAL HIGH (ref 0.61–1.24)
GFR calc Af Amer: 60 mL/min — ABNORMAL LOW (ref >60)
GFR calc non Af Amer: 51 mL/min — ABNORMAL LOW (ref >60)
Glucose, Bld: 160 mg/dL — ABNORMAL HIGH (ref 70–99)
Potassium: 4.8 mmol/L (ref 3.5–5.1)
Sodium: 135 mmol/L (ref 135–145)

## 2019-08-08 LAB — HEPATIC FUNCTION PANEL
ALT: 25 U/L (ref 0–44)
AST: 28 U/L (ref 15–41)
Albumin: 4 g/dL (ref 3.5–5.0)
Alkaline Phosphatase: 51 U/L (ref 38–126)
Bilirubin, Direct: <0.1 mg/dL (ref 0.0–0.2)
Total Bilirubin: 0.5 mg/dL (ref 0.3–1.2)
Total Protein: 7.8 g/dL (ref 6.5–8.1)

## 2019-08-08 LAB — LIPID PANEL
Cholesterol: 189 mg/dL (ref 0–200)
HDL: 44 mg/dL (ref >40)
LDL Cholesterol: 71 mg/dL (ref 0–99)
Total CHOL/HDL Ratio: 4.3 RATIO
Triglycerides: 372 mg/dL — ABNORMAL HIGH (ref <150)
VLDL: 74 mg/dL — ABNORMAL HIGH (ref 0–40)

## 2019-08-08 NOTE — Patient Instructions (Signed)
NO medication changes today   Labs today We will only contact you if something comes back abnormal or we need to make some changes. Otherwise no news is good news!   You have been referred back to Dr Radford Pax for CPAP. You will be called by her office to schedule your appointment.   Please call us if you do not hear from them.   Please call weight loss clinic in [place] recommends that you schedule a follow-up appointment in: 6 months. You will get a call to schedule this appointment.   Please call office at [phone] option [phone] if you have any questions or concerns.    At the [facility], you and your health needs are our priority. As part of our continuing mission to provide you with exceptional heart care, we have created designated Provider Care Teams. These Care Teams include your primary Cardiologist (physician) and Advanced Practice Providers (APPs- Physician Assistants and Nurse Practitioners) who all work together to provide you with the care you need, when you need it.   You may see any of the following providers on your designated Care Team at your next follow up: Marland Kitchen Dr Glori Bickers . Dr Loralie Champagne . Darrick Grinder, NP . Lyda Jester, PA . Audry Riles, PharmD   Please be sure to bring in all your medications bottles to every appointment.

## 2019-08-08 NOTE — Progress Notes (Signed)
ReDS Vest / Clip - 08/08/19 0900      ReDS Vest / Clip   Station Marker  D    Ruler Value  45    ReDS Value Range  Low volume    ReDS Actual Value  34

## 2019-08-09 NOTE — Progress Notes (Signed)
PCP: Valerie Roys, DO HF Cardiology: Dr. Aundra Dubin  64 y.o. with history of bicuspid aortic valve disorder s/p mechanical AVR in 9/11 and 5.5 cm ascending aortic aneurysm on most recent CT in [id number], chronic systolic CHF, and smoking was referred by Dr. Orvan Seen for optimization of CHF prior to ascending aorta replacement surgery.  Most recent echo in 7/20 showed EF 40-45% with stable mechanical aortic valve.  RHC/LHC in 7/20 showed nonobstructive CAD and R>L sided failure.    Patient returns today for followup of CHF.  He quit smoking about 3 months ago but has gained 11 lbs.  He is still waiting to get CPAP.  Main complaint is low back pain.  Breathing is ok in the house though he is not very active.  No chest pain.    Labs (6/20): LDL 94 Labs (7/20): K 4.4, creatinine 1.13 Labs (8/20): K 4.2, creatinine 1.41 => 1.18 Labs (9/20): K 4.3, creatinine 1.21 Labs (12/20): K 5.1, creatinine 1.4, LDL 96, TGS 559  ECG: NSR, PVCs, lateral TWIs (personally reviewed)  REDS clip 34%  PMH: 1. Bicuspid aortic valve: s/p mechanical AVR 9/11 2. H/o CVA 3. Hyperlipidemia 4. H/o renal infarct 5. Chronic systolic CHF: Nonischemic cardiomyopathy.  - Echo (7/20): EF 40-45%, normal RV, mechanical aortic valve with mean gradient 15 mmHg - LHC/RHC (7/20): 60% stenosis D1; mean RA 22, RV 45/26 mean 36, mean PCWP 23, CI 2.4 6. Ascending aortic aneurysm: Suspect related to bicuspid aortic valve.  - CTA (7/20) with 5.5 cm ascending aorta.  7. OSA: moderate.   Social History   Socioeconomic History  . Marital status: Single    Spouse name: Not on file  . Number of children: 1  . Years of education: 4  . Highest education level: Associate degree: academic program  Occupational History  . Occupation: Disabled    Employer: UNEMPLOYED  Tobacco Use  . Smoking status: Former Smoker    Packs/day: 0.50    Years: 46.00    Pack years: 23.00    Types: Cigarettes    Start date: 07/16/2017    Quit date: 05/11/2019     Years since quitting: 0.2  . Smokeless tobacco: Never Used  Substance and Sexual Activity  . Alcohol use: No    Alcohol/week: 0.0 standard drinks  . Drug use: No  . Sexual activity: Not Currently  Other Topics Concern  . Not on file  Social History Narrative  . Not on file   Social Determinants of Health   Financial Resource Strain:   . Difficulty of Paying Living Expenses: Not on file  Food Insecurity:   . Worried About Charity fundraiser in the Last Year: Not on file  . Ran Out of Food in the Last Year: Not on file  Transportation Needs:   . Lack of Transportation (Medical): Not on file  . Lack of Transportation (Non-Medical): Not on file  Physical Activity:   . Days of Exercise per Week: Not on file  . Minutes of Exercise per Session: Not on file  Stress:   . Feeling of Stress : Not on file  Social Connections:   . Frequency of Communication with Friends and Family: Not on file  . Frequency of Social Gatherings with Friends and Family: Not on file  . Attends Religious Services: Not on file  . Active Member of Clubs or Organizations: Not on file  . Attends Archivist Meetings: Not on file  . Marital Status: Not on file  Intimate Partner Violence:   . Fear of Current or Ex-Partner: Not on file  . Emotionally Abused: Not on file  . Physically Abused: Not on file  . Sexually Abused: Not on file   Family History  Problem Relation Age of Onset  . Arthritis Mother   . Dementia Mother   . Colon cancer Mother   . Arthritis Father   . Diabetes Father   . Stroke Father   . Colon cancer Father   . Heart attack Brother   . Breast cancer Sister   . Seizures Sister   . Cancer Brother        brain  . Heart disease Brother   . Heart attack Brother    ROS: All systems reviewed and negative except as per HPI.  Current Outpatient Medications  Medication Sig Dispense Refill  . albuterol (PROVENTIL) (2.5 MG/3ML) 0.083% nebulizer solution Take 3 mLs (2.5 mg  total) by nebulization every 6 (six) hours as needed for wheezing or shortness of breath. 150 mL 1  . albuterol (VENTOLIN HFA) 108 (90 Base) MCG/ACT inhaler Inhale 2 puffs into the lungs every 6 (six) hours as needed for wheezing or shortness of breath. 19 g 6  . allopurinol (ZYLOPRIM) 100 MG tablet Take 1 tablet (100 mg total) by mouth daily. 90 tablet 1  . aspirin 81 MG chewable tablet Chew 81 mg by mouth daily.      Marland Kitchen buPROPion (WELLBUTRIN XL) 150 MG 24 hr tablet Take 1 tablet (150 mg total) by mouth daily for 3 days, THEN 1 tablet (150 mg total) 2 (two) times daily. 180 tablet 3  . cyclobenzaprine (FLEXERIL) 10 MG tablet Take 1 tablet (10 mg total) by mouth 3 times/day as needed-between meals & bedtime for muscle spasms. 180 tablet 0  . Fluticasone-Salmeterol (ADVAIR) 250-50 MCG/DOSE AEPB Inhale 1 puff into the lungs as needed. 60 each 6  . furosemide (LASIX) 40 MG tablet Take 1.5 tablets (60 mg total) by mouth 2 (two) times daily. 45 tablet 3  . icosapent Ethyl (VASCEPA) 1 g capsule Take 2 capsules (2 g total) by mouth 2 (two) times daily. 120 capsule 5  . lisinopril (ZESTRIL) 5 MG tablet Take 5 mg by mouth 2 (two) times daily.    . metoprolol succinate (TOPROL-XL) 100 MG 24 hr tablet TAKE 1 TABLET BY MOUTH ONCE DAILY WITH  OR  IMMEDIATELY  FOLLOWING  A  MEAL 90 tablet 3  . nortriptyline (PAMELOR) 10 MG capsule Take 1 capsule (10 mg total) by mouth at bedtime. 90 capsule 1  . Omega-3 Fatty Acids (FISH OIL) 1000 MG CPDR Take 2,000 mg by mouth daily. 180 capsule 2  . potassium chloride SA (K-DUR) 20 MEQ tablet Take 1 tablet (20 mEq total) by mouth daily. 90 tablet 3  . predniSONE (DELTASONE) 50 MG tablet Take 1 tablet (50 mg total) by mouth daily with breakfast. 5 tablet 0  . rosuvastatin (CRESTOR) 40 MG tablet Take 1 tablet (40 mg total) by mouth daily. 90 tablet 3  . SPIRIVA HANDIHALER 18 MCG inhalation capsule PLACE ONE CAPSULE INTO INHALER AND INHALE THE CONTENTS BY MOUTH ONCE DAILY 30 capsule  0  . spironolactone (ALDACTONE) 25 MG tablet Take 1 tablet (25 mg total) by mouth daily. 90 tablet 3  . warfarin (COUMADIN) 4 MG tablet TAKE 1 TABLET BY MOUTH ONCE DAILY AT  6  PM.  TAKE  AS  DIRECTED 90 tablet 3  . warfarin (COUMADIN) 7.5 MG tablet Take 7.5  mg by mouth See admin instructions. 4 mg for 2 days, 7.5 mg on the 3rd day, and repeat     No current facility-administered medications for this encounter.   BP 122/70   Pulse 76   Wt (!) 159 kg (350 lb 9.6 oz)   SpO2 95%   BMI 56.59 kg/m   General: Obese Neck: No JVD, no thyromegaly or thyroid nodule.  Lungs: Clear to auscultation bilaterally with normal respiratory effort. CV: Nondisplaced PMI.  Heart regular S1/S2 with mechanical S2, no S3/S4, 2/6 SEM RUSB.  No peripheral edema.  No carotid bruit.  Normal pedal pulses.  Abdomen: Soft, nontender, no hepatosplenomegaly, no distention.  Skin: Intact without lesions or rashes.  Neurologic: Alert and oriented x 3.  Psych: Normal affect. Extremities: No clubbing or cyanosis.  HEENT: Normal.   Assessment/Plan: 1. Chronic systolic CHF:  Nonischemic cardiomyopathy.  Most recent echo with EF 40-45% in 7/20.  [place] in 7/20 showed right > left-sided CHF.  I suspect that severe sleep apnea plays a role in right-sided HF.  Coronary angiography in 7/20 showed nonobstructive CAD.  He is not volume overloaded on exam or by REDS clip, but weight is up after quitting smoking.  - Continue Lasix 60 mg bid.  BMET today.   - Continue spironolactone 25 mg daily.  - Continue current Toprol XL.  - Continue lisinopril 5 mg bid.   2. OSA: Moderate OSA, still needs to get CPAP.  3. CAD: Nonobstructive.  I would aim for LDL < 70.  - Continue ASA 81.  - Crestor recently increased to 40 mg daily, check lipids/LFTs today.  Would use Vascepa if TGs still high.    4. Mechanical aortic valve: Stable function on 7/20 echo.  Given history of CVA, needs Lovenox bridging whenever INR subtherapeutic.  He is on ASA 81  also.  5. Smoking: I congratulated him on quitting.   6. Ascending aortic aneurysm: Suspect this is related to his bicuspid aortic valve.  Last CTA in 7/20 showed aneurysm 5.5 cm.   - He is seeing Dr. Orvan Seen, will need ascending aorta replacement.   7. Obesity: We discussed diet/exercise.  He is interested in a weight loss clinic in [place].   Followup 6 months.   Loralie Champagne 08/09/2019

## 2019-08-11 ENCOUNTER — Telehealth: Payer: Self-pay | Admitting: *Deleted

## 2019-08-11 ENCOUNTER — Telehealth (HOSPITAL_COMMUNITY): Payer: Self-pay | Admitting: Cardiology

## 2019-08-11 DIAGNOSIS — I428 Other cardiomyopathies: Secondary | ICD-10-CM

## 2019-08-11 DIAGNOSIS — E785 Hyperlipidemia, unspecified: Secondary | ICD-10-CM

## 2019-08-11 MED ORDER — FENOFIBRATE 48 MG PO TABS: 48.0000 mg | ORAL_TABLET | Freq: Every day | ORAL | 11 refills | Status: DC

## 2019-08-11 NOTE — Telephone Encounter (Signed)
-----   Message from Sueanne Margarita, MD sent at 08/09/2019 11:56 PM EST ----- Gae Bon  Please find out what the holdup is on this patient's CPAP order - it was ordered back in October  Traci ----- Message ----- From: Larey Dresser, MD Sent: 08/09/2019  10:21 PM EST To: Sueanne Margarita, MD  Traci, this patient has been waiting for a while for CPAP, any way to expedite?

## 2019-08-11 NOTE — Telephone Encounter (Signed)
-----   Message from Larey Dresser, MD sent at 08/10/2019  9:48 PM EST ----- Triglycerides still high.  Start fenofibrate 45 mg daily.  Lipids 2 months.

## 2019-08-11 NOTE — Telephone Encounter (Signed)
[facility] states (Roberto [name]) they have attempted to call the patient 3 times and sent an email and has not had a response back from the patient. I will call the patient and inform him to call [facility] at [phone] to move forward with his cpap.  Got his caretaker on the phone she is going to call [facility].

## 2019-08-11 NOTE — Telephone Encounter (Signed)
Pt aware and voiced understanding rx swnt appt made and appt card mailed

## 2019-08-13 ENCOUNTER — Telehealth (INDEPENDENT_AMBULATORY_CARE_PROVIDER_SITE_OTHER): Payer: Medicare Other | Admitting: Family Medicine

## 2019-08-13 ENCOUNTER — Encounter: Payer: Self-pay | Admitting: Family Medicine

## 2019-08-13 ENCOUNTER — Ambulatory Visit: Payer: Medicare Other | Admitting: Dietician

## 2019-08-13 ENCOUNTER — Other Ambulatory Visit: Payer: Self-pay

## 2019-08-13 VITALS — BP 101/72 | HR 68 | Temp 97.2°F

## 2019-08-13 DIAGNOSIS — J069 Acute upper respiratory infection, unspecified: Secondary | ICD-10-CM

## 2019-08-13 LAB — POCT INR

## 2019-08-13 MED ORDER — PREDNISONE 50 MG PO TABS: 50.0000 mg | ORAL_TABLET | Freq: Every day | ORAL | 0 refills | Status: DC

## 2019-08-13 NOTE — Progress Notes (Signed)
BP 101/72 (BP Location: Left Arm, Patient Position: Sitting, Cuff Size: Normal)   Pulse 68   Temp (!) 97.2 F (36.2 C) (Oral)    Subjective:    Patient ID: Roberto [name], male    DOB: 11/12/1955, 64 y.o.   MRN: [id number]  HPI: Roberto [name] is a 64 y.o. male  Chief Complaint  Patient presents with  . Sore Throat    Symptoms began yesterday  . Cough  . Sinusitis   UPPER RESPIRATORY TRACT INFECTION Duration: yesterday Worst symptom: sore throat Fever: no Cough: yes Shortness of breath: no Wheezing: no Chest pain: no Chest tightness: no Chest congestion: no Nasal congestion: yes Runny nose: no Post nasal drip: no Sneezing: no Sore throat: yes Swollen glands: no Sinus pressure: yes Headache: no Face pain: no Toothache: no Ear pain: no  Ear pressure: no  Eyes red/itching:no Eye drainage/crusting: no  Vomiting: no Rash: no Fatigue: yes Sick contacts: no Strep contacts: no  Context: worse Recurrent sinusitis: no Relief with OTC cold/cough medications: no  Treatments attempted: coricedin   Relevant past medical, surgical, family and social history reviewed and updated as indicated. Interim medical history since our last visit reviewed. Allergies and medications reviewed and updated.  Review of Systems  Constitutional: Negative.   HENT: Positive for congestion, sinus pressure and sore throat. Negative for dental problem, drooling, ear discharge, ear pain, facial swelling, hearing loss, mouth sores, nosebleeds, postnasal drip, rhinorrhea, sinus pain, sneezing, tinnitus, trouble swallowing and voice change.   Eyes: Negative.   Respiratory: Positive for cough. Negative for apnea, choking, chest tightness, shortness of breath, wheezing and stridor.   Cardiovascular: Negative.   Psychiatric/Behavioral: Negative.     Per HPI unless specifically indicated above     Objective:    BP 101/72 (BP Location: Left Arm, Patient Position: Sitting, Cuff Size:  Normal)   Pulse 68   Temp (!) 97.2 F (36.2 C) (Oral)   Wt Readings from Last 3 Encounters:  08/08/19 (!) 350 lb 9.6 oz (159 kg)  06/10/19 (!) 348 lb (157.9 kg)  06/09/19 (!) 348 lb (157.9 kg)    Physical Exam Constitutional:      General: He is not in acute distress.    Appearance: Normal appearance. He is well-developed. He is obese. He is not ill-appearing, toxic-appearing or diaphoretic.  HENT:     Head: Normocephalic and atraumatic.     Right Ear: Hearing and external ear normal.     Left Ear: Hearing and external ear normal.     Nose: Nose normal.  Eyes:     General: Lids are normal. No scleral icterus.       Right eye: No discharge.        Left eye: No discharge.     Extraocular Movements: Extraocular movements intact.     Conjunctiva/sclera: Conjunctivae normal.     Pupils: Pupils are equal, round, and reactive to light.  Pulmonary:     Effort: Pulmonary effort is normal. No respiratory distress.  Musculoskeletal:        General: Normal range of motion.     Cervical back: Normal range of motion.  Skin:    Coloration: Skin is not jaundiced or pale.     Findings: No bruising, erythema, lesion or rash.  Neurological:     General: No focal deficit present.     Mental Status: He is alert and oriented to person, place, and time.  Psychiatric:        Mood and  Affect: Mood normal.        Speech: Speech normal.        Behavior: Behavior normal.        Thought Content: Thought content normal.        Judgment: Judgment normal.     Results for orders placed or performed during the hospital encounter of 08/08/19  Hepatic function panel  Result Value Ref Range   Total Protein 7.8 6.5 - 8.1 g/dL   Albumin 4.0 3.5 - 5.0 g/dL   AST 28 15 - 41 U/L   ALT 25 0 - 44 U/L   Alkaline Phosphatase 51 38 - 126 U/L   Total Bilirubin 0.5 0.3 - 1.2 mg/dL   Bilirubin, Direct <0.1 0.0 - 0.2 mg/dL   Indirect Bilirubin NOT CALCULATED 0.3 - 0.9 mg/dL  Lipid panel  Result Value Ref Range    Cholesterol 189 0 - 200 mg/dL   Triglycerides 372 (H) <150 mg/dL   HDL 44 >40 mg/dL   Total CHOL/HDL Ratio 4.3 RATIO   VLDL 74 (H) 0 - 40 mg/dL   LDL Cholesterol 71 0 - 99 mg/dL  Basic Metabolic Panel (BMET)  Result Value Ref Range   Sodium 135 135 - 145 mmol/L   Potassium 4.8 3.5 - 5.1 mmol/L   Chloride 101 98 - 111 mmol/L   CO2 21 (L) 22 - 32 mmol/L   Glucose, Bld 160 (H) 70 - 99 mg/dL   BUN 26 (H) 8 - 23 mg/dL   Creatinine, Ser 1.43 (H) 0.61 - 1.24 mg/dL   Calcium 9.5 8.9 - 10.3 mg/dL   GFR calc non Af Amer 51 (L) >60 mL/min   GFR calc Af Amer 60 (L) >60 mL/min   Anion gap 13 5 - 15      Assessment & Plan:   Problem List Items Addressed This Visit    None    Visit Diagnoses    Upper respiratory tract infection, unspecified type    -  Primary   Will get COVID tested. Self-quarantine until results are back. Will treat congestion with prednisone. Call with any concerns.   Relevant Orders   Novel Coronavirus, NAA ([facility])       Follow up plan: Return if symptoms worsen or fail to improve.    . This visit was completed via FaceTime due to the restrictions of the COVID-19 pandemic. All issues as above were discussed and addressed. Physical exam was done as above through visual confirmation on FaceTime. If it was felt that the patient should be evaluated in the office, they were directed there. The patient verbally consented to this visit. . Location of the patient: home . Location of the provider: home . Those involved with this call:  . Provider: Park Liter, DO . CMA: Tiffany Reel, CMA . Front Desk/Registration: Don Perking  . Time spent on call: 15 minutes with patient face to face via video conference. More than 50% of this time was spent in counseling and coordination of care. 23 minutes total spent in review of patient's record and preparation of their chart.

## 2019-08-13 NOTE — Patient Instructions (Signed)
To schedule a COVID test, please  text "COVID" to [phone], OR you can log on to [email or web address] to easily make an on-line appointment. If you do not have access to a smart phone or PC, you can call [phone] to get assistance.  

## 2019-08-19 ENCOUNTER — Telehealth: Payer: Self-pay | Admitting: *Deleted

## 2019-08-19 ENCOUNTER — Encounter: Payer: Self-pay | Admitting: Family Medicine

## 2019-08-19 DIAGNOSIS — Z952 Presence of prosthetic heart valve: Secondary | ICD-10-CM | POA: Diagnosis not present

## 2019-08-19 DIAGNOSIS — Z7901 Long term (current) use of anticoagulants: Secondary | ICD-10-CM | POA: Diagnosis not present

## 2019-08-19 LAB — POCT INR

## 2019-08-19 NOTE — Telephone Encounter (Signed)
Patient has changed to [place] as a dme.

## 2019-08-19 NOTE — Telephone Encounter (Signed)

## 2019-08-19 NOTE — Telephone Encounter (Signed)
Patient/Roberto [name] has a 10 week follow up appointment scheduled for 09/25/19. Patient/Roberto [name] understands he needs to keep this appointment for insurance compliance. Patient was grateful for the call and thanked me.

## 2019-08-20 NOTE — Telephone Encounter (Signed)
LMTCB for Roberto [name] at [phone]

## 2019-08-22 ENCOUNTER — Telehealth: Payer: Self-pay

## 2019-08-22 ENCOUNTER — Telehealth: Payer: Self-pay | Admitting: Family Medicine

## 2019-08-22 MED ORDER — SPIRIVA HANDIHALER 18 MCG IN CAPS: ORAL_CAPSULE | RESPIRATORY_TRACT | 1 refills | Status: DC

## 2019-08-22 NOTE — Telephone Encounter (Signed)
Copied from [place] [id number]. Topic: General - Other >> Aug 20, 2019  8:34 AM Celene Kras wrote: Reason for CRM: Pt called stating that he went for a covid test and that his results came back negative. Please advise.

## 2019-08-22 NOTE — Telephone Encounter (Signed)
Fax from pharmacy.  Requesting refill and 3 month supply for Spiriva Handihaler. Pharmacy requesting new Rx.

## 2019-08-22 NOTE — Telephone Encounter (Signed)
3rd attempt unable to connect with pt or Ms. Moser Closing referral pending any other needs of patient.  [place] . Embedded Care Coordination [place]  Care Management ??[email or web address]  ??[phone]

## 2019-08-22 NOTE — Telephone Encounter (Signed)
That's great. I'm not sure what he needs?

## 2019-08-28 LAB — POCT INR

## 2019-09-01 ENCOUNTER — Other Ambulatory Visit: Payer: Self-pay

## 2019-09-01 ENCOUNTER — Encounter: Payer: Medicare Other | Attending: Family Medicine | Admitting: Dietician

## 2019-09-01 VITALS — Ht 66.0 in | Wt 351.1 lb

## 2019-09-01 DIAGNOSIS — Z6841 Body Mass Index (BMI) 40.0 and over, adult: Secondary | ICD-10-CM

## 2019-09-01 DIAGNOSIS — Z713 Dietary counseling and surveillance: Secondary | ICD-10-CM | POA: Insufficient documentation

## 2019-09-01 DIAGNOSIS — E119 Type 2 diabetes mellitus without complications: Secondary | ICD-10-CM | POA: Diagnosis not present

## 2019-09-01 NOTE — Progress Notes (Signed)
Medical Nutrition Therapy: Visit start time: 0940  end time: 1050  Assessment:  Diagnosis: obesity Past medical history: CHF, hx of aortic valve replacement, HDL, gout Psychosocial issues/ stress concerns: intermittent mild depression due to weight; quit smoking 4 months ago  Preferred learning method:  . Visual   Current weight: 351.1lbs with jacket  Height: 5'6" Medications, supplements: reconciled list in medical record  Progress and evaluation:   Patient reports weight gain of about 10lbs since he quit smoking. Most weight gain has been in past 15 years.    He needs a new valve replacement, and has been advised to lose weight prior to surgery. He has been working to reduce carbohydrate intake and food portions. He has not yet noticed much weight loss.   Physical activity is difficult due to arthritis pain in his back  He is on Warfarin and limiting vitamin K intake, including all salads/ lettuce.   Has history of gout   Physical activity: none due to back pain, concern over heart issues, aneurism   Dietary Intake:  Usual eating pattern includes 2-3 meals and 2-3 snacks per day. Dining out frequency: 2 meals per week.  Breakfast: recently started eating breakfast -- 3pcs sausage patties in cooking spray + 3 eggs over easy no bread Snack: apple Lunch: sometimes combines lunch and dinner for one afternoon meal -- usually sandwich with deli meat, cheese (stopped chips recently) Snack: apple or grapes Supper: varies -- hamburger patty with rice and gravy; spaghetti; sometimes takeout fried chicken (removes crust and skin), pizza Snack: occasionally fruit; lunchmeat; usually no sweets Beverages: water; sugar free black cherry flavored water, diet pepsi 1-2 per day, some during night  Nutrition Care Education: Topics covered:  Basic nutrition: basic food groups, appropriate nutrient balance, appropriate meal and snack schedule, general nutrition guidelines    Weight control:  importance of low sugar and low fat choices, portion control, estimated energy needs for weight loss at 1400 kcal, provided guidance for 40-45% CHO, 25-30% protein, and 30% fat; discussed balanced meal options; discussed role of physical activity and options for short duration activities; benefits of tracking food intake; option of meal replacement beverage for one meal daily. Hyperlipidemia: healthy and unhealthy fats Other:  Vitamin K in foods; avoiding large meat portions and limiting processed and smoked meats to prevent uric acid accumulation Nutritional Diagnosis:  [place]-2.1 Inpaired nutrition utilization and [place]-2.3 Food-medication interaction As related to history of elevated uric acid; vitamin K restricted diet.  As evidenced by gout; warfarin treatment. -3.3 Overweight/obesity As related to excess calories, limited activity.  As evidenced by patient with current BMI of 56, working on diet changes to promote weight loss.  Intervention:   Instruction and discussion as noted above.  Patient has been making dietary improvements in recent weeks and is motivated to continue.   Established goals for additional change.  Low carb diet might help with quicker weight loss, but could increase uric acid.    Education Materials given:  . Plate Planner with food lists, sample meal pattern . sample menus . Vitamin K in foods . Goals/ instructions   Learner/ who was taught:  . Patient    Level of understanding: Marland Kitchen Verbalizes/ demonstrates competency   Demonstrated degree of understanding via:   Teach back Learning barriers: . None  Willingness to learn/ readiness for change: . Eager, change in progress   Monitoring and Evaluation:  Dietary intake, exercise, and body weight      follow up: 09/22/19 at 9:30am

## 2019-09-01 NOTE — Patient Instructions (Signed)
   Plan to eat something every 4-5 hours during the day. Wait at least 2-3 hours after a meal before eating a snack. OK to replace one meal each day with a protein shake or meal replacement drink such as Premier protein, Fairlife protein, Carnation low sugar breakfast drink, etc.   Keep portions of starchy foods low, handful-size (1/2- 2/3 cup).   Choose lean meats and control portions -- large portions of meat increase the risk of gout attack. Lean meats = baked or grilled fish, skinless chicken, pork tenderloin or chop, sirloin or round cuts of beef.   Try "light" versions of mayo, salad dressings and keep portions small.   Find ways to increase daily movement in small amounts. Start with 5-10 minutes as tolerated and gradually increase as able.

## 2019-09-03 ENCOUNTER — Telehealth: Payer: Self-pay | Admitting: Internal Medicine

## 2019-09-03 ENCOUNTER — Other Ambulatory Visit: Payer: Self-pay

## 2019-09-03 LAB — POCT INR

## 2019-09-03 NOTE — Telephone Encounter (Signed)
Patient needs a note for work stasting he is going to have procedure.  Please call kim to discuss.

## 2019-09-04 NOTE — Telephone Encounter (Signed)
[name] notified and was very Patent attorney. Appt note updated to include visitor is ok.

## 2019-09-04 NOTE — Telephone Encounter (Signed)
Given his multiple comorbidities, I think it is okay for Roberto [name] to accompany him to the upcoming office visit.  Nelva Bush, MD [facility]

## 2019-09-04 NOTE — Telephone Encounter (Signed)
Spoke with [name], ok per DPR. Patient needs updated letter for his insurance and disability forms saying patient is still unable to work his job. His employer is saying he is but patient feels he cannot. Patient is scheduled for OV next week. [name] will bring any forms and information to that appointment so that we can determine what is exactly needed for patient.  She asked if she could come to appointment as she is his caregiver. Advised I would make sure with Dr End and if unable to be there in person we could possibly have her on speaker phone. Routing to Dr End to advise if significant other can be present in office.

## 2019-09-05 ENCOUNTER — Ambulatory Visit: Payer: Medicare Other | Admitting: Internal Medicine

## 2019-09-08 ENCOUNTER — Telehealth: Payer: Self-pay | Admitting: Family Medicine

## 2019-09-08 NOTE — Telephone Encounter (Signed)
Letter to Caregiver  September 08, 2019   Salley Slaughter  [address] [place] [phone]   Dear Ms. Moser,   Thank you for taking the time to speak with me on the phone today regarding financial resources for Mr. Brockett. I have attached the [place] for [facility] for your reference.  Please let me know if you need assistance once the Medicaid claims have been filed and you have determined the amount that is still owed.  The number to [facility] is [phone]. Please have his MRN available when you call [id number]. A representative will be able to give you the exact amount due including what has been turned over to collections.  Please call me at the number listed below if you have any follow-up questions.   [place] . Embedded Care Coordination [place]  Care Management ??[email or web address]  ??[phone]

## 2019-09-08 NOTE — Telephone Encounter (Signed)
° °  09/08/2019   Name: Roberto [name]    MRN: [id number]    DOB: 1955/10/05    AGE: 64 y.o.    GENDER: male    PCP Park Liter P, DO.   Called pt caregiver Vangie Bicker regarding [employer] Referral for financial assistance. She stated that pt has recently gone on [employer] and that she has been his advocate in getting him approved for coverage.  She stated that medicaid will be retroactively covering claims filed from July 2020 forward but any medical bills that have gone into collections will not be paid. Ms. Ermalene Postin notified me that Mr. Suppes that he is at risk of losing the social security disability benefit that he receives monthly. She stated that there will be a hearing re-scheduled to review his eligibility. She is working with Dr. Saunders Revel on March 10th appt to provide medical proof of pt not able to work. 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 She also stated that pt has March 15th appt in August with cardiologist and she expects Mr. Capan will have surgery soon thereafter.  She asked that I send information to her through mail so that she will have the [facility] application to [facility] and the contact information for [place] for her to reach out. She stated that she would follow up with me if she needed further assistance.  Closing referral pending any other needs of patient. [place] [facility] [email or web address]   HA:[id number]          [email or web address]   HA:[id number]

## 2019-09-09 NOTE — Progress Notes (Signed)
Follow-up Outpatient Visit Date: 09/10/2019  Primary Care Provider: Valerie Roys, DO [place] [address]  Chief Complaint: Follow-up heart failure, thoracic aortic aneurysm, and valvular heart disease  HPI:  Roberto [name] is a 64 y.o. male with history of mechanical aortic valve replacement,non-obstructive coronary artery disease, thoracic aortic aneurysmstroke, right renal infarct, chronic systolic heart failure (LVEF 40-45%), hyperlipidemia,and morbid obesity, who presents for follow-up of valvular heart disease and NICM.  He also follows with Dr. Aundra Dubin in the advanced HF clinic.  I last saw him in early 06/2019, at which time he reported improved shortness of breath after having stopped smoking a few days earlier.  Chronic exertional dyspnea was unchanged.  He was awaiting delivery of his CPAP device.  He is following with Dr. Orvan Seen regarding his thoracic aortic aneurysm.  Today, Roberto [name] reports that he is feeling relatively well.  He has stable exertional dyspnea when walking at a "normal" pace for more than 50 feet.  His mobility also remains quite limited due to back pain.  Overall, his functional capacity has not changed much over the last 6 to 9 months.  He denies chest pain, palpitations, lightheadedness, and edema.  He remains compliant with his medications and notes that his most recent INR a week ago was 3.4.  He and his significant other are trying to lose weight through dietary changes.  He has remained tobacco free since our last visit but still uses nicotine patches.  Roberto [name] is scheduled for repeat CTA of the chest and follow-up with Dr. Julien Girt later this month.  --------------------------------------------------------------------------------------------------  Past Medical History:  Diagnosis Date  . Bicuspid aortic valve    a. s/p #27 Carbomedics mechanical valve on 03/25/2010; b. on Coumadin; c. TTE 12/17: EF 40-45%, moderately dilated LV with  moderate LVH, AVR well-seated with 14 mmHg gradient, peak AV velocity 2.5 m/s, mild mitral valve thickening with mild MR, mildly dilated RV with mildly reduced contraction  . Cellulitis   . Chronic systolic CHF (congestive heart failure) ([place])    a. R/LHC 03/2010 showed no significant CAD, LVEDP 31 mmHg, mean AoV gradient 34 mmHg at rest and 47 mmHg with dobutamine 20 mcg/kg/min, AVA 1.0 cm^2, RA 31, RV 68/25, PA 68/47, PCWP 38. PA sat 65%. CO 6.2 L/min (Fick) and 5.3 L/min (thermodilution)  . Clotting disorder ([place])   . H/O mechanical aortic valve replacement 03/25/2010   a. #27 Carbomedics mechanical valve  . Hypercholesterolemia   . Renal infarct [facility]) 2017   Multiple right renal infarcts, likely embolic.  . Stroke ([place])   . TIA (transient ischemic attack) 05/2014   Past Surgical History:  Procedure Laterality Date  . AORTIC VALVE REPLACEMENT    . CARDIAC CATHETERIZATION  03/21/2010   No significant CAD. Severe aortic stenosis. Severely elevated left and right heart filling pressures.  Marland Kitchen CARDIAC SURGERY  2009   CHF  . CARPAL TUNNEL RELEASE Left 2005  . RIGHT HEART CATH AND CORONARY ANGIOGRAPHY N/A 01/28/2019   Procedure: RIGHT HEART CATH AND CORONARY ANGIOGRAPHY;  Surgeon: Nelva Bush, MD;  Location: [place] CV LAB;  Service: Cardiovascular;  Laterality: N/A;  . TONSILLECTOMY  1962    Current Meds  Medication Sig  . albuterol (PROVENTIL) (2.5 MG/3ML) 0.083% nebulizer solution Take 3 mLs (2.5 mg total) by nebulization every 6 (six) hours as needed for wheezing or shortness of breath.  Marland Kitchen albuterol (VENTOLIN HFA) 108 (90 Base) MCG/ACT inhaler Inhale 2 puffs into the lungs  every 6 (six) hours as needed for wheezing or shortness of breath.  . allopurinol (ZYLOPRIM) 100 MG tablet Take 1 tablet (100 mg total) by mouth daily.  Marland Kitchen aspirin 81 MG chewable tablet Chew 81 mg by mouth daily.    Marland Kitchen buPROPion (WELLBUTRIN XL) 150 MG 24 hr tablet Take 1 tablet (150 mg total) by mouth daily  for 3 days, THEN 1 tablet (150 mg total) 2 (two) times daily.  . cyclobenzaprine (FLEXERIL) 10 MG tablet Take 1 tablet (10 mg total) by mouth 3 times/day as needed-between meals & bedtime for muscle spasms.  . fenofibrate (TRICOR) 48 MG tablet Take 1 tablet (48 mg total) by mouth daily.  . Fluticasone-Salmeterol (ADVAIR) 250-50 MCG/DOSE AEPB Inhale 1 puff into the lungs as needed.  . furosemide (LASIX) 40 MG tablet Take 1.5 tablets (60 mg total) by mouth 2 (two) times daily.  Marland Kitchen icosapent Ethyl (VASCEPA) 1 g capsule Take 2 capsules (2 g total) by mouth 2 (two) times daily.  Marland Kitchen lisinopril (ZESTRIL) 5 MG tablet Take 5 mg by mouth 2 (two) times daily.  . metoprolol succinate (TOPROL-XL) 100 MG 24 hr tablet TAKE 1 TABLET BY MOUTH ONCE DAILY WITH  OR  IMMEDIATELY  FOLLOWING  A  MEAL  . nortriptyline (PAMELOR) 10 MG capsule Take 1 capsule (10 mg total) by mouth at bedtime.  . Omega-3 Fatty Acids (FISH OIL) 1000 MG CPDR Take 2,000 mg by mouth daily.  . potassium chloride SA (K-DUR) 20 MEQ tablet Take 1 tablet (20 mEq total) by mouth daily.  . rosuvastatin (CRESTOR) 40 MG tablet Take 40 mg by mouth daily.  Marland Kitchen spironolactone (ALDACTONE) 25 MG tablet Take 1 tablet (25 mg total) by mouth daily.  Marland Kitchen tiotropium (SPIRIVA HANDIHALER) 18 MCG inhalation capsule PLACE ONE CAPSULE INTO INHALER AND INHALE THE CONTENTS BY MOUTH ONCE DAILY  . warfarin (COUMADIN) 4 MG tablet TAKE 1 TABLET BY MOUTH ONCE DAILY AT  6  PM.  TAKE  AS  DIRECTED  . warfarin (COUMADIN) 7.5 MG tablet Take 7.5 mg by mouth See admin instructions. 4 mg for 2 days, 7.5 mg on the 3rd day, and repeat    Allergies: Patient has no known allergies.  Social History   Tobacco Use  . Smoking status: Former Smoker    Packs/day: 0.50    Years: 46.00    Pack years: 23.00    Types: Cigarettes    Start date: 07/16/2017    Quit date: 05/11/2019    Years since quitting: 0.3  . Smokeless tobacco: Never Used  Substance Use Topics  . Alcohol use: No     Alcohol/week: 0.0 standard drinks  . Drug use: No    Family History  Problem Relation Age of Onset  . Arthritis Mother   . Dementia Mother   . Colon cancer Mother   . Arthritis Father   . Diabetes Father   . Stroke Father   . Colon cancer Father   . Heart attack Brother   . Breast cancer Sister   . Seizures Sister   . Cancer Brother        brain  . Heart disease Brother   . Heart attack Brother     Review of Systems: A 12-system review of systems was performed and was negative except as noted in the HPI.  --------------------------------------------------------------------------------------------------  Physical Exam: BP 92/78 (BP Location: Left Arm, Patient Position: Sitting, Cuff Size: Large)   Pulse 74   Ht 5\' 6"  (1.676 m)  Wt (!) 348 lb 12 oz (158.2 kg)   SpO2 96%   BMI 56.29 kg/m   General: NAD. Neck: No JVD, though body habitus limits evaluation. Lungs: Clear to auscultation bilaterally without wheezes or crackles. Heart: Regular rate and rhythm with 1/6 systolic murmur and mechanical S2.  No rubs or gallops. Abdomen: Obese and soft without tenderness. Extremities: Trace pretibial edema.  EKG: Sinus rhythm with first-degree AV block and occasional PVCs as well as incomplete left bundle branch block.  No significant change from prior tracing on 08/08/2019.  Lab Results  Component Value Date   WBC 8.2 02/20/2019   HGB 14.1 02/20/2019   HCT 43.9 02/20/2019   MCV 92.8 02/20/2019   PLT 243 02/20/2019    Lab Results  Component Value Date   NA 135 08/08/2019   K 4.8 08/08/2019   CL 101 08/08/2019   CO2 21 (L) 08/08/2019   BUN 26 (H) 08/08/2019   CREATININE 1.43 (H) 08/08/2019   GLUCOSE 160 (H) 08/08/2019   ALT 25 08/08/2019    Lab Results  Component Value Date   CHOL 189 08/08/2019   HDL 44 08/08/2019   LDLCALC 71 08/08/2019   LDLDIRECT 96.4 06/04/2019   TRIG 372 (H) 08/08/2019   CHOLHDL 4.3 08/08/2019     --------------------------------------------------------------------------------------------------  ASSESSMENT AND PLAN: Chronic HFrEF: Roberto [name] appears clinically stable with NYHA class III symptoms.  He does not appear grossly volume overloaded at this time.  I recommend continuation of his current medications including lisinopril 5 mg daily, metoprolol succinate 100 mg daily, and furosemide 60 mg twice daily.  Low blood pressure precludes escalation of goal-directed medical therapy at this time.  We will need to monitor his first-degree AV block in the setting of ongoing metoprolol use.  Coronary artery disease: Nonobstructive by catheterization last year.  Continue medical therapy to prevent progression of disease.  Valvular heart disease: Stable heart failure symptoms without evidence of volume overload.  Continue warfarin and low-dose aspirin.  Thoracic aortic aneurysm: Asymptomatic with well-controlled blood pressure and improving lipids.  Continue close follow-up with Dr. Orvan Seen regarding timing of repair.  Hyperlipidemia: Most recent lipid panel a month ago showed improved LDL and triglycerides, though both were still slightly above goal.  We will plan to continue current regimen of rosuvastatin, fenofibrate, and Vascepa for now and work on lifestyle modifications.  Morbid obesity: Weight loss encouraged through diet and exercise, though I advised Roberto [name] to avoid activities that involve straining, as this could be problematic with his thoracic aortic aneurysm.  Follow-up: Return to clinic in 3 months.  Nelva Bush, MD 09/10/2019 9:32 AM

## 2019-09-10 ENCOUNTER — Encounter: Payer: Self-pay | Admitting: Internal Medicine

## 2019-09-10 ENCOUNTER — Ambulatory Visit (INDEPENDENT_AMBULATORY_CARE_PROVIDER_SITE_OTHER): Payer: Medicare Other | Admitting: Internal Medicine

## 2019-09-10 ENCOUNTER — Other Ambulatory Visit: Payer: Self-pay

## 2019-09-10 VITALS — BP 92/78 | HR 74 | Ht 66.0 in | Wt 348.8 lb

## 2019-09-10 DIAGNOSIS — I712 Thoracic aortic aneurysm, without rupture, unspecified: Secondary | ICD-10-CM

## 2019-09-10 DIAGNOSIS — I2510 Atherosclerotic heart disease of native coronary artery without angina pectoris: Secondary | ICD-10-CM | POA: Diagnosis not present

## 2019-09-10 DIAGNOSIS — E782 Mixed hyperlipidemia: Secondary | ICD-10-CM

## 2019-09-10 DIAGNOSIS — I5022 Chronic systolic (congestive) heart failure: Secondary | ICD-10-CM | POA: Diagnosis not present

## 2019-09-10 DIAGNOSIS — I38 Endocarditis, valve unspecified: Secondary | ICD-10-CM

## 2019-09-10 NOTE — Patient Instructions (Signed)
Medication Instructions:  Your physician recommends that you continue on your current medications as directed. Please refer to the Current Medication list given to you today.  *If you need a refill on your cardiac medications before your next appointment, please call your pharmacy*  Lab Work: NONE If you have labs (blood work) drawn today and your tests are completely normal, you will receive your results only by: Marland Kitchen MyChart Message (if you have MyChart) OR . A paper copy in the mail If you have any lab test that is abnormal or we need to change your treatment, we will call you to review the results.  Testing/Procedures: NONE  Follow-Up: At [facility], you and your health needs are our priority.  As part of our continuing mission to provide you with exceptional heart care, we have created designated Provider Care Teams.  These Care Teams include your primary Cardiologist (physician) and Advanced Practice Providers (APPs -  Physician Assistants and Nurse Practitioners) who all work together to provide you with the care you need, when you need it.  We recommend signing up for the patient portal called "MyChart".  Sign up information is provided on this After Visit Summary.  MyChart is used to connect with patients for Virtual Visits (Telemedicine).  Patients are able to view lab/test results, encounter notes, upcoming appointments, etc.  Non-urgent messages can be sent to your provider as well.   To learn more about what you can do with MyChart, go to [email or web address].    Your next appointment:   3 month(s)  The format for your next appointment:   In Person  Provider:    You may see Nelva Bush, MD or one of the following Advanced Practice Providers on your designated Care Team:    Murray Hodgkins, NP  Christell Faith, [name]  Marrianne Mood, [name]

## 2019-09-11 ENCOUNTER — Telehealth: Payer: Self-pay | Admitting: Internal Medicine

## 2019-09-11 NOTE — Telephone Encounter (Signed)
Cancelled 3/24 appointment and LVM on phone number listed

## 2019-09-11 NOTE — Telephone Encounter (Signed)
-----   Message from Anselm Pancoast, [place] sent at 09/11/2019  3:29 PM EST ----- This patient is down to see Dr. Saunders Revel on September 24 2019 but was just seen yesterday and Dr. Saunders Revel says to follow up in 3 months.  Thanks, Ivin Booty.

## 2019-09-14 LAB — POCT INR: INR: 3.2 — AB (ref 2.0–3.0)

## 2019-09-15 ENCOUNTER — Ambulatory Visit: Payer: Medicare Other | Admitting: Cardiothoracic Surgery

## 2019-09-15 ENCOUNTER — Inpatient Hospital Stay: Admission: RE | Admit: 2019-09-15 | Payer: Medicare Other | Source: Ambulatory Visit

## 2019-09-19 ENCOUNTER — Telehealth: Payer: Self-pay

## 2019-09-22 ENCOUNTER — Ambulatory Visit: Payer: Medicare Other | Admitting: Dietician

## 2019-09-23 DIAGNOSIS — Z952 Presence of prosthetic heart valve: Secondary | ICD-10-CM | POA: Diagnosis not present

## 2019-09-23 DIAGNOSIS — Z7901 Long term (current) use of anticoagulants: Secondary | ICD-10-CM | POA: Diagnosis not present

## 2019-09-23 LAB — POCT INR: INR: 2.3 (ref 2.0–3.0)

## 2019-09-24 ENCOUNTER — Ambulatory Visit: Payer: Medicare Other | Admitting: Internal Medicine

## 2019-09-24 NOTE — Progress Notes (Signed)
Virtual Visit via Telephone Note   This visit type was conducted due to national recommendations for restrictions regarding the COVID-19 Pandemic (e.g. social distancing) in an effort to limit this patient's exposure and mitigate transmission in our community.  Due to his co-morbid illnesses, this patient is at least at moderate risk for complications without adequate follow up.  This format is felt to be most appropriate for this patient at this time.  The patient did not have access to video technology/had technical difficulties with video requiring transitioning to audio format only (telephone).  All issues noted in this document were discussed and addressed.  No physical exam could be performed with this format.  Please refer to the patient's chart for his  consent to telehealth for [facility].   Evaluation Performed:  Follow-up visit  This visit type was conducted due to national recommendations for restrictions regarding the COVID-19 Pandemic (e.g. social distancing).  This format is felt to be most appropriate for this patient at this time.  All issues noted in this document were discussed and addressed.  No physical exam was performed (except for noted visual exam findings with Video Visits).  Please refer to the patient's chart (MyChart message for video visits and phone note for telephone visits) for the patient's consent to telehealth for [facility].  Date:  09/25/2019   ID:  Roberto [name], DOB 05-10-1956, MRN [id number]  Patient Location:  Home  Provider location:   [place]  PCP:  Valerie Roys, DO  Cardiologist:  Nelva Bush, MD  Sleep Medicine:  Fransico Him, MD Electrophysiologist:  None   Chief Complaint:  OSA  History of Present Illness:    Roberto [name] is a 64 y.o. male who presents via audio/video conferencing for a telehealth visit today.    This is a 64yo male with a hx of BAV s/p AVR, chronic systolic CHF, CVA and morbid Obesity who was  referred by Dr. Aundra Dubin for evaluation of possible sleep apnea with a home sleep study.  He was found to have moderate OSA with an AHI of 20/hr and O2 sat with minimal sat 88%.  He was started on auto CPAP and is here for followup.    He is doing well with his CPAP device and thinks that he has gotten used to it.  He tolerates the mask and feels the pressure is adequate.  Since going on CPAP he feels rested in the am and has no significant daytime sleepiness.  He denies any significant mouth or nasal dryness or nasal congestion.  He does not think that he snores.  He goes to bed at 11pm and wakes up around 3-4am each morning which he has always done.    The patient does not have symptoms concerning for COVID-19 infection (fever, chills, cough, or new shortness of breath).   Prior CV studies:   The following studies were reviewed today:  Home sleep study and PAP compliance download  Past Medical History:  Diagnosis Date  . Bicuspid aortic valve    a. s/p #27 Carbomedics mechanical valve on 03/25/2010; b. on Coumadin; c. TTE 12/17: EF 40-45%, moderately dilated LV with moderate LVH, AVR well-seated with 14 mmHg gradient, peak AV velocity 2.5 m/s, mild mitral valve thickening with mild MR, mildly dilated RV with mildly reduced contraction  . Cellulitis   . Chronic systolic CHF (congestive heart failure) ([place])    a. R/LHC 03/2010 showed no significant CAD, LVEDP 31 mmHg, mean AoV gradient 34 mmHg  at rest and 47 mmHg with dobutamine 20 mcg/kg/min, AVA 1.0 cm^2, RA 31, RV 68/25, PA 68/47, PCWP 38. PA sat 65%. CO 6.2 L/min ([place]) and 5.3 L/min (thermodilution)  . Clotting disorder ([place])   . H/O mechanical aortic valve replacement 03/25/2010   a. #27 Carbomedics mechanical valve  . Hypercholesterolemia   . Renal infarct [facility]) 2017   Multiple right renal infarcts, likely embolic.  . Stroke ([place])   . TIA (transient ischemic attack) 05/2014   Past Surgical History:  Procedure Laterality Date  . AORTIC  VALVE REPLACEMENT    . CARDIAC CATHETERIZATION  03/21/2010   No significant CAD. Severe aortic stenosis. Severely elevated left and right heart filling pressures.  Marland Kitchen CARDIAC SURGERY  2009   CHF  . CARPAL TUNNEL RELEASE Left 2005  . RIGHT HEART CATH AND CORONARY ANGIOGRAPHY N/A 01/28/2019   Procedure: RIGHT HEART CATH AND CORONARY ANGIOGRAPHY;  Surgeon: Nelva Bush, MD;  Location: [place] CV LAB;  Service: Cardiovascular;  Laterality: N/A;  . TONSILLECTOMY  1962     Current Meds  Medication Sig  . albuterol (PROVENTIL) (2.5 MG/3ML) 0.083% nebulizer solution Take 3 mLs (2.5 mg total) by nebulization every 6 (six) hours as needed for wheezing or shortness of breath.  Marland Kitchen albuterol (VENTOLIN HFA) 108 (90 Base) MCG/ACT inhaler Inhale 2 puffs into the lungs every 6 (six) hours as needed for wheezing or shortness of breath.  . allopurinol (ZYLOPRIM) 100 MG tablet Take 1 tablet (100 mg total) by mouth daily.  Marland Kitchen aspirin 81 MG chewable tablet Chew 81 mg by mouth daily.    Marland Kitchen buPROPion (WELLBUTRIN XL) 150 MG 24 hr tablet Take 1 tablet (150 mg total) by mouth daily for 3 days, THEN 1 tablet (150 mg total) 2 (two) times daily.  . cyclobenzaprine (FLEXERIL) 10 MG tablet Take 1 tablet (10 mg total) by mouth 3 times/day as needed-between meals & bedtime for muscle spasms.  . fenofibrate (TRICOR) 48 MG tablet Take 1 tablet (48 mg total) by mouth daily.  . Fluticasone-Salmeterol (ADVAIR) 250-50 MCG/DOSE AEPB Inhale 1 puff into the lungs as needed.  . furosemide (LASIX) 40 MG tablet Take 1.5 tablets (60 mg total) by mouth 2 (two) times daily.  Marland Kitchen icosapent Ethyl (VASCEPA) 1 g capsule Take 2 capsules (2 g total) by mouth 2 (two) times daily.  Marland Kitchen lisinopril (ZESTRIL) 5 MG tablet Take 5 mg by mouth 2 (two) times daily.  . metoprolol succinate (TOPROL-XL) 100 MG 24 hr tablet TAKE 1 TABLET BY MOUTH ONCE DAILY WITH  OR  IMMEDIATELY  FOLLOWING  A  MEAL  . nortriptyline (PAMELOR) 10 MG capsule Take 1 capsule (10  mg total) by mouth at bedtime.  . Omega-3 Fatty Acids (FISH OIL) 1000 MG CPDR Take 2,000 mg by mouth daily.  . potassium chloride SA (K-DUR) 20 MEQ tablet Take 1 tablet (20 mEq total) by mouth daily.  . rosuvastatin (CRESTOR) 40 MG tablet Take 40 mg by mouth daily.  Marland Kitchen spironolactone (ALDACTONE) 25 MG tablet Take 1 tablet (25 mg total) by mouth daily.  Marland Kitchen tiotropium (SPIRIVA HANDIHALER) 18 MCG inhalation capsule PLACE ONE CAPSULE INTO INHALER AND INHALE THE CONTENTS BY MOUTH ONCE DAILY  . warfarin (COUMADIN) 4 MG tablet TAKE 1 TABLET BY MOUTH ONCE DAILY AT  6  PM.  TAKE  AS  DIRECTED  . warfarin (COUMADIN) 7.5 MG tablet Take 7.5 mg by mouth See admin instructions. 4 mg for 2 days, 7.5 mg on the 3rd day,  and repeat     Allergies:   Patient has no known allergies.   Social History   Tobacco Use  . Smoking status: Former Smoker    Packs/day: 0.50    Years: 46.00    Pack years: 23.00    Types: Cigarettes    Start date: 07/16/2017    Quit date: 05/11/2019    Years since quitting: 0.3  . Smokeless tobacco: Never Used  Substance Use Topics  . Alcohol use: No    Alcohol/week: 0.0 standard drinks  . Drug use: No     Family Hx: The patient's family history includes Arthritis in his father and mother; Breast cancer in his sister; Cancer in his brother; Colon cancer in his father and mother; Dementia in his mother; Diabetes in his father; Heart attack in his brother and brother; Heart disease in his brother; Seizures in his sister; Stroke in his father.  ROS:   Please see the history of present illness.     All other systems reviewed and are negative.   Labs/Other Tests and Data Reviewed:    Recent Labs: 02/06/2019: B Natriuretic Peptide 137.4 02/20/2019: Hemoglobin 14.1; Platelets 243 08/08/2019: ALT 25; BUN 26; Creatinine, Ser 1.43; Potassium 4.8; Sodium 135   Recent Lipid Panel Lab Results  Component Value Date/Time   CHOL 189 08/08/2019 08:41 AM   CHOL 226 (H) 12/31/2018 08:35 AM    CHOL 177 05/17/2017 01:11 PM   CHOL 131 05/25/2014 05:56 AM   TRIG 372 (H) 08/08/2019 08:41 AM   TRIG 361 (H) 05/17/2017 01:11 PM   TRIG 167 05/25/2014 05:56 AM   HDL 44 08/08/2019 08:41 AM   HDL 39 (L) 12/31/2018 08:35 AM   HDL 30 (L) 05/25/2014 05:56 AM   CHOLHDL 4.3 08/08/2019 08:41 AM   LDLCALC 71 08/08/2019 08:41 AM   LDLCALC Comment 12/31/2018 08:35 AM   LDLCALC 68 05/25/2014 05:56 AM   LDLDIRECT 96.4 06/04/2019 07:12 AM    Wt Readings from Last 3 Encounters:  09/25/19 (!) 348 lb (157.9 kg)  09/10/19 (!) 348 lb 12 oz (158.2 kg)  09/01/19 (!) 351 lb 1.6 oz (159.3 kg)     Objective:    Vital Signs:  Ht 5\' 6"  (1.676 m)   Wt (!) 348 lb (157.9 kg)   BMI 56.17 kg/m    ASSESSMENT & PLAN:    1.  OSA - The pathophysiology of obstructive sleep apnea , it's cardiovascular consequences & modes of treatment including CPAP were discused with the patient in detail & they evidenced understanding.  The patient is tolerating PAP therapy well without any problems. The PAP download was reviewed today and showed an AHI of 4.9/hr on auto PAP  with 80% compliance in using more than 4 hours nightly.  The patient has been using and benefiting from PAP use and will continue to benefit from therapy.   2.  Morbid Obesity -I have encouraged him to get into a routine exercise program and cut back on carbs and portions.    COVID-19 Education: The signs and symptoms of COVID-19 were discussed with the patient and how to seek care for testing (follow up with PCP or arrange E-visit).  The importance of social distancing was discussed today.  Patient Risk:   After full review of this patient's clinical status, I feel that they are at least moderate risk at this time.  Time:   Today, I have spent 20 minutes on telemedicine discussing medical problems including OSA and morbid obesity  and reviewing patient's chart including home sleep study and PAP compliance download.  Medication Adjustments/Labs and  Tests Ordered: Current medicines are reviewed at length with the patient today.  Concerns regarding medicines are outlined above.  Tests Ordered: No orders of the defined types were placed in this encounter.  Medication Changes: No orders of the defined types were placed in this encounter.   Disposition:  Follow up in 1 year(s)  Signed, Fransico Him, MD  09/25/2019 9:51 AM     [facility]

## 2019-09-25 ENCOUNTER — Encounter: Payer: Self-pay | Admitting: Cardiology

## 2019-09-25 ENCOUNTER — Telehealth (INDEPENDENT_AMBULATORY_CARE_PROVIDER_SITE_OTHER): Payer: Medicare Other | Admitting: Cardiology

## 2019-09-25 ENCOUNTER — Other Ambulatory Visit: Payer: Self-pay

## 2019-09-25 VITALS — Ht 66.0 in | Wt 348.0 lb

## 2019-09-25 DIAGNOSIS — I2510 Atherosclerotic heart disease of native coronary artery without angina pectoris: Secondary | ICD-10-CM | POA: Diagnosis not present

## 2019-09-25 DIAGNOSIS — G4733 Obstructive sleep apnea (adult) (pediatric): Secondary | ICD-10-CM

## 2019-09-25 NOTE — Patient Instructions (Signed)

## 2019-09-29 ENCOUNTER — Ambulatory Visit
Admission: RE | Admit: 2019-09-29 | Discharge: 2019-09-29 | Disposition: A | Discharge status: Home / Self Care | Payer: Medicare Other | Source: Ambulatory Visit | Attending: Cardiothoracic Surgery | Admitting: Cardiothoracic Surgery

## 2019-09-29 ENCOUNTER — Other Ambulatory Visit: Payer: Self-pay

## 2019-09-29 ENCOUNTER — Ambulatory Visit (INDEPENDENT_AMBULATORY_CARE_PROVIDER_SITE_OTHER): Payer: Medicare Other | Admitting: Cardiothoracic Surgery

## 2019-09-29 VITALS — BP 100/67 | HR 61 | Temp 97.9°F | Resp 20 | Ht 66.0 in | Wt 348.0 lb

## 2019-09-29 DIAGNOSIS — I712 Thoracic aortic aneurysm, without rupture, unspecified: Secondary | ICD-10-CM

## 2019-09-29 DIAGNOSIS — I7121 Aneurysm of the ascending aorta, without rupture: Secondary | ICD-10-CM

## 2019-09-29 NOTE — Progress Notes (Signed)
[address]       [place] [address]             [phone]     CARDIOTHORACIC SURGERY OFFICE NOTE  Referring Provider is Larey Dresser, MD Primary Cardiologist is Nelva Bush, MD PCP is Valerie Roys, DO   HPI:  64 year old gentleman presents for follow-up of his ascending aortic aneurysm.  In brief he has a history of bicuspid aortic valve disease status post aortic valve replacement in [place] several years ago.  Last summer he was diagnosed with an ascending aortic aneurysm measuring approximately 57 mm in maximal diameter.  In addition he had evidence for heart failure and mildly reduced left ventricular function.  He has been followed by the heart failure clinic here at [facility].  Patient denies chest pain or back pain.  He has otherwise been doing well.   Current Outpatient Medications  Medication Sig Dispense Refill  . albuterol (PROVENTIL) (2.5 MG/3ML) 0.083% nebulizer solution Take 3 mLs (2.5 mg total) by nebulization every 6 (six) hours as needed for wheezing or shortness of breath. 150 mL 1  . albuterol (VENTOLIN HFA) 108 (90 Base) MCG/ACT inhaler Inhale 2 puffs into the lungs every 6 (six) hours as needed for wheezing or shortness of breath. 19 g 6  . allopurinol (ZYLOPRIM) 100 MG tablet Take 1 tablet (100 mg total) by mouth daily. 90 tablet 1  . aspirin 81 MG chewable tablet Chew 81 mg by mouth daily.      . cyclobenzaprine (FLEXERIL) 10 MG tablet Take 1 tablet (10 mg total) by mouth 3 times/day as needed-between meals & bedtime for muscle spasms. 180 tablet 0  . fenofibrate (TRICOR) 48 MG tablet Take 1 tablet (48 mg total) by mouth daily. 30 tablet 11  . Fluticasone-Salmeterol (ADVAIR) 250-50 MCG/DOSE AEPB Inhale 1 puff into the lungs as needed. 60 each 6  . furosemide (LASIX) 40 MG tablet Take 1.5 tablets (60 mg total) by mouth 2 (two) times daily. 45 tablet 3  . icosapent Ethyl (VASCEPA) 1 g capsule Take 2 capsules (2 g total) by mouth 2  (two) times daily. 120 capsule 5  . lisinopril (ZESTRIL) 5 MG tablet Take 5 mg by mouth 2 (two) times daily.    . metoprolol succinate (TOPROL-XL) 100 MG 24 hr tablet TAKE 1 TABLET BY MOUTH ONCE DAILY WITH  OR  IMMEDIATELY  FOLLOWING  A  MEAL 90 tablet 3  . nortriptyline (PAMELOR) 10 MG capsule Take 1 capsule (10 mg total) by mouth at bedtime. 90 capsule 1  . Omega-3 Fatty Acids (FISH OIL) 1000 MG CPDR Take 2,000 mg by mouth daily. 180 capsule 2  . potassium chloride SA (K-DUR) 20 MEQ tablet Take 1 tablet (20 mEq total) by mouth daily. 90 tablet 3  . rosuvastatin (CRESTOR) 40 MG tablet Take 40 mg by mouth daily.    Marland Kitchen tiotropium (SPIRIVA HANDIHALER) 18 MCG inhalation capsule PLACE ONE CAPSULE INTO INHALER AND INHALE THE CONTENTS BY MOUTH ONCE DAILY 90 capsule 1  . warfarin (COUMADIN) 4 MG tablet TAKE 1 TABLET BY MOUTH ONCE DAILY AT  6  PM.  TAKE  AS  DIRECTED 90 tablet 3  . warfarin (COUMADIN) 7.5 MG tablet Take 7.5 mg by mouth See admin instructions. 4 mg for 2 days, 7.5 mg on the 3rd day, and repeat    . buPROPion (WELLBUTRIN XL) 150 MG 24 hr tablet Take 1 tablet (150 mg total) by mouth  daily for 3 days, THEN 1 tablet (150 mg total) 2 (two) times daily. 180 tablet 3  . spironolactone (ALDACTONE) 25 MG tablet Take 1 tablet (25 mg total) by mouth daily. 90 tablet 3   No current facility-administered medications for this visit.      Physical Exam:   BP 100/67 (BP Location: Right Arm, Patient Position: Sitting, Cuff Size: Large)   Pulse 61   Temp 97.9 F (36.6 C) (Temporal)   Resp 20   Ht 5\' 6"  (1.676 m)   Wt (!) 157.9 kg   SpO2 96% Comment: RA  BMI 56.17 kg/m   General:  Well-appearing no acute distress  Chest:   Clear to auscultation bilaterally  CV:   Regular rate and rhythm  Neuro:   Grossly intact  Extremities:  2+ edema  Diagnostic Tests:  Reviewed his CT scan from today and agree with the interpretation.  Impression:  64 year old gentleman with a 5.7 cm ascending aortic  aneurysm.  Although this does meet criteria for repair, he recently stopped smoking.  He acknowledges the need for weight loss prior to elective surgery.  Plan:  Follow-up in 6 months with repeat scan  I spent in excess of 20 minutes during the conduct of this office consultation and >50% of this time involved direct face-to-face encounter with the patient for counseling and/or coordination of their care.  Level 2                 10 minutes Level 3                 15 minutes Level 4                 25 minutes Level 5                 40 minutes B. Murvin Natal, MD 09/29/2019 3:47 PM

## 2019-09-30 ENCOUNTER — Other Ambulatory Visit: Payer: Self-pay | Admitting: *Deleted

## 2019-09-30 DIAGNOSIS — E663 Overweight: Secondary | ICD-10-CM

## 2019-09-30 DIAGNOSIS — R531 Weakness: Secondary | ICD-10-CM

## 2019-09-30 NOTE — Progress Notes (Signed)
Per Dr. Orvan Seen request, a referral has been placed to [facility] main PT department to contact Roberto [name] to begin physical therapy for strengthening exercises and "pre-hab" in preparation for aortic surgery.

## 2019-10-01 LAB — POCT INR: INR: 2.8 (ref 2.0–3.0)

## 2019-10-03 ENCOUNTER — Other Ambulatory Visit: Payer: Self-pay

## 2019-10-03 ENCOUNTER — Ambulatory Visit (INDEPENDENT_AMBULATORY_CARE_PROVIDER_SITE_OTHER): Payer: Medicare Other | Admitting: Family Medicine

## 2019-10-03 ENCOUNTER — Encounter: Payer: Self-pay | Admitting: Family Medicine

## 2019-10-03 VITALS — BP 100/64 | HR 68 | Temp 97.4°F

## 2019-10-03 DIAGNOSIS — I2510 Atherosclerotic heart disease of native coronary artery without angina pectoris: Secondary | ICD-10-CM | POA: Diagnosis not present

## 2019-10-03 DIAGNOSIS — J301 Allergic rhinitis due to pollen: Secondary | ICD-10-CM

## 2019-10-03 DIAGNOSIS — R251 Tremor, unspecified: Secondary | ICD-10-CM | POA: Diagnosis not present

## 2019-10-03 LAB — POCT INR: INR: 2.6 (ref 2.0–3.0)

## 2019-10-03 MED ORDER — CETIRIZINE HCL 10 MG PO TABS: 10.0000 mg | ORAL_TABLET | Freq: Every day | ORAL | 11 refills | Status: DC

## 2019-10-03 NOTE — Progress Notes (Signed)
BP 100/64   Pulse 68   Temp (!) 97.4 F (36.3 C)    Subjective:    Patient ID: Roberto [name], male    DOB: 1956-05-10, 64 y.o.   MRN: [id number]  HPI: Roberto [name] is a 64 y.o. male  Chief Complaint  Patient presents with  . lump in throat    2 weeks, wont go away.    Has been having some tightness in his throat with a feeling of needing to constantly clear it. He has not been sick- feels fine, started this morning with a little bit of a sore throat. No other URI symptoms. He is usually SOB due to his cardiac issues. No changes there. This has been going on for 2 weeks. Nothing is making it better or worse. It's constant. Has not been changing.   He also notes that his L hand has been shaking when he goes to do something. He does have a family history of a tremor- notes that his Mom had one really badly. He has too many doctors appointments right now, but would like to go see a neurologist in the future. No other concerns or complaints at this time.   Relevant past medical, surgical, family and social history reviewed and updated as indicated. Interim medical history since our last visit reviewed. Allergies and medications reviewed and updated.  Review of Systems  Constitutional: Negative.   HENT: Positive for sore throat. Negative for congestion, dental problem, drooling, ear discharge, ear pain, facial swelling, hearing loss, mouth sores, nosebleeds, postnasal drip, rhinorrhea, sinus pressure, sinus pain, sneezing, tinnitus, trouble swallowing and voice change.   Eyes: Negative.   Respiratory: Positive for shortness of breath and wheezing. Negative for apnea, cough, choking, chest tightness and stridor.   Cardiovascular: Negative.   Gastrointestinal: Negative.   Psychiatric/Behavioral: Negative.     Per HPI unless specifically indicated above     Objective:    BP 100/64   Pulse 68   Temp (!) 97.4 F (36.3 C)   Wt Readings from Last 3 Encounters:  09/29/19 (!) 348  lb (157.9 kg)  09/25/19 (!) 348 lb (157.9 kg)  09/10/19 (!) 348 lb 12 oz (158.2 kg)    Physical Exam Vitals and nursing note reviewed.  Pulmonary:     Effort: Pulmonary effort is normal. No respiratory distress.     Comments: Speaking in full sentences Neurological:     Mental Status: He is alert.  Psychiatric:        Mood and Affect: Mood normal.        Behavior: Behavior normal.        Thought Content: Thought content normal.        Judgment: Judgment normal.     Results for orders placed or performed in visit on 09/03/19  POCT INR  Result Value Ref Range   INR        Assessment & Plan:   Problem List Items Addressed This Visit    None    Visit Diagnoses    Seasonal allergic rhinitis due to pollen    -  Primary   Will treat with zytrec, if not better in 1 week, will call for further evaluation. Continue to monitor.    Tremor       Likely familial essential tremor. Does not want to see neurology right now. Will consider appointment in the future. If he does want to go, we'll refer.        Follow up plan: Return  if symptoms worsen or fail to improve.   . This visit was completed via telephone due to the restrictions of the COVID-19 pandemic. All issues as above were discussed and addressed but no physical exam was performed. If it was felt that the patient should be evaluated in the office, they were directed there. The patient verbally consented to this visit. Patient was unable to complete an audio/visual visit due to Lack of equipment. Due to the catastrophic nature of the COVID-19 pandemic, this visit was done through audio contact only. . Location of the patient: home . Location of the provider: work . Those involved with this call:  . Provider: Park Liter, DO . CMA: Lauretta Grill, RMA . Front Desk/Registration: Don Perking  . Time spent on call: 25 minutes on the phone discussing health concerns. 40 minutes total spent in review of patient's record  and preparation of their chart.

## 2019-10-03 NOTE — Patient Instructions (Signed)
Essential Tremor A tremor is trembling or shaking that a person cannot control. Most tremors affect the hands or arms. Tremors can also affect the head, vocal cords, legs, and other parts of the body. Essential tremor is a tremor without a known cause. Usually, it occurs while a person is trying to perform an action. It tends to get worse gradually as a person ages. What are the causes? The cause of this condition is not known. What increases the risk? You are more likely to develop this condition if:  You have a family member with essential tremor.  You are age 40 or older.  You take certain medicines. What are the signs or symptoms? The main sign of a tremor is a rhythmic shaking of certain parts of your body that is uncontrolled and unintentional. You may:  Have difficulty eating with a spoon or fork.  Have difficulty writing.  Nod your head up and down or side to side.  Have a quivering voice. The shaking may:  Get worse over time.  Come and go.  Be more noticeable on one side of your body.  Get worse due to stress, fatigue, caffeine, and extreme heat or cold. How is this diagnosed? This condition may be diagnosed based on:  Your symptoms and medical history.  A physical exam. There is no single test to diagnose an essential tremor. However, your health care provider may order tests to rule out other causes of your condition. These may include:  Blood and urine tests.  Imaging studies of your brain, such as CT scan and MRI.  A test that measures involuntary muscle movement (electromyogram). How is this treated? Treatment for essential tremor depends on the severity of the condition.  Some tremors may go away without treatment.  Mild tremors may not need treatment if they do not affect your day-to-day life.  Severe tremors may need to be treated using one or more of the following options: ? Medicines. ? Lifestyle changes. ? Occupational or physical  therapy. Follow these instructions at home: Lifestyle   Do not use any products that contain nicotine or tobacco, such as cigarettes and e-cigarettes. If you need help quitting, ask your health care provider.  Limit your caffeine intake as told by your health care provider.  Try to get 8 hours of sleep each night.  Find ways to manage your stress that fits your lifestyle and personality. Consider trying meditation or yoga.  Try to anticipate stressful situations and allow extra time to manage them.  If you are struggling emotionally with the effects of your tremor, consider working with a mental health provider. General instructions  Take over-the-counter and prescription medicines only as told by your health care provider.  Avoid extreme heat and extreme cold.  Keep all follow-up visits as told by your health care provider. This is important. Visits may include physical therapy visits. Contact a health care provider if:  You experience any changes in the location or intensity of your tremors.  You start having a tremor after starting a new medicine.  You have tremor with other symptoms, such as: ? Numbness. ? Tingling. ? Pain. ? Weakness.  Your tremor gets worse.  Your tremor interferes with your daily life.  You feel down, blue, or sad for at least 2 weeks in a row.  Worrying about your tremor and what other people think about you interferes with your everyday life functions, including relationships, work, or school. Summary  Essential tremor is a tremor   without a known cause. Usually, it occurs when you are trying to perform an action.  The cause of this condition is not known.  The main sign of a tremor is a rhythmic shaking of certain parts of your body that is uncontrolled and unintentional.  Treatment for essential tremor depends on the severity of the condition. This information is not intended to replace advice given to you by your health care provider.  Make sure you discuss any questions you have with your health care provider. Document Revised: 06/29/2017 Document Reviewed: 06/29/2017 Elsevier Patient Education  2020 [employer]  

## 2019-10-03 NOTE — Telephone Encounter (Signed)
This is not managed by me any more

## 2019-10-03 NOTE — Telephone Encounter (Signed)
LOV: 10/03/2019.   Requesting 90 day supply.

## 2019-10-06 ENCOUNTER — Other Ambulatory Visit: Payer: Self-pay

## 2019-10-06 DIAGNOSIS — I38 Endocarditis, valve unspecified: Secondary | ICD-10-CM

## 2019-10-06 DIAGNOSIS — Z952 Presence of prosthetic heart valve: Secondary | ICD-10-CM

## 2019-10-06 MED ORDER — LISINOPRIL 5 MG PO TABS: 5.0000 mg | ORAL_TABLET | Freq: Every day | ORAL | 0 refills | Status: DC

## 2019-10-06 MED ORDER — WARFARIN SODIUM 7.5 MG PO TABS: 7.5000 mg | ORAL_TABLET | Freq: Every day | ORAL | 0 refills | Status: DC

## 2019-10-06 NOTE — Telephone Encounter (Signed)
Refill request for Warfarin 7.5mg  tablet LOV: 10/03/2019 Next Appt: 10/17/2019

## 2019-10-06 NOTE — Telephone Encounter (Signed)
If Dr. Wynetta Emery gave him the machine, then she should be the one telling him what to do with his warfarin dose and should be the one refilling his warfarin rx. If Dr. Wynetta Emery wants Dr. Saunders Revel to manage patient warfarin, then he needs to come to the [place] office and be seen by [facility].

## 2019-10-06 NOTE — Telephone Encounter (Signed)
Called patient.  Spoke with Belenda Cruise per DPR.  She stated that Dr. Wynetta Emery gave him a machine to check his own coumadin level.  She stated she checks it weekly. Last week it was 2.6, it will be checked again tomorrow.  She stated that Dr. Wynetta Emery said Dr. Saunders Revel would be the one to manage his coumadin.   Patient is unsure who needs to refill his Coumadin.

## 2019-10-06 NOTE — Telephone Encounter (Signed)
*  STAT* If patient is at the pharmacy, call can be transferred to refill team.   1. Which medications need to be refilled? (please list name of each medication and dose if known)  Lisinopril   2. Which pharmacy/location (including street and city if local pharmacy) is medication to be sent to? [facility] [place]  3. 90 day supply

## 2019-10-07 NOTE — Telephone Encounter (Signed)
Explained to Vangie Bicker, ok per DPR. She verbalized understanding of the recommendations from the pharmacist.  States the patient was told by the PCP that she wasn't managing the coumadin anymore and he needed to call Dr End. Our office has not been seeing patient for INR management but we can get him scheduled with Coumadin Clinic if patient agreeable. She is agreeable. Referral placed and she is aware someone will call her to schedule. IN the meantime, she will call his PCP for refill once more to get him to his referral appointment.

## 2019-10-07 NOTE — Telephone Encounter (Signed)
Patient needing Coumadin refilled.  Please see not below from [facility]

## 2019-10-09 ENCOUNTER — Telehealth: Payer: Self-pay | Admitting: Family Medicine

## 2019-10-09 NOTE — Telephone Encounter (Signed)
Routing to provider to be sure patient is taking correct dosage before calling pharmacy.   Warfarin 4mg  Sig: TAKE 1 TABLET BY MOUTH ONCE DAILY AT  6  PM.  TAKE  AS  DIRECTED  Warfarin 7.5mg  Sig: Take 1 tablet (7.5 mg total) by mouth daily. 4 mg for 2 days, 7.5 mg on the 3rd day, and repeat  Should patient be taking both and are the directions correct?

## 2019-10-09 NOTE — Telephone Encounter (Signed)
Please call pharmacy for clarification on his Warfarin.  CB# [phone]

## 2019-10-10 ENCOUNTER — Encounter: Payer: Self-pay | Admitting: Dietician

## 2019-10-10 NOTE — Telephone Encounter (Signed)
Called [facility] and clarified prescription with them. They will be getting the 7.5 mg tablets ready for the patient to pick up.

## 2019-10-10 NOTE — Progress Notes (Signed)
Have not heard back from patient to reschedule his missed appointment from 09/22/19. Sent notification to referring provider.

## 2019-10-10 NOTE — Telephone Encounter (Signed)
Called and spoke to patient's girlfriend. She states that Dr. Saunders Revel is not managing the patient's coumadin. She states that there has been a big miscommunication but Dr. Wynetta Emery is still managing his INR. Patient has a follow up scheduled for next week, 10/17/19.   [name] states that he is currently taking 4 mg, 4mg , 7.5 mg, repeat the sequence. Will call pharmacy and let them know.

## 2019-10-10 NOTE — Telephone Encounter (Signed)
Has not had a INR follow up in months, cardiology was following. Will need to confirm dose with patient and if we are managing again, he will need an appointment.

## 2019-10-13 ENCOUNTER — Ambulatory Visit: Payer: Medicare Other | Admitting: Physical Therapy

## 2019-10-15 ENCOUNTER — Ambulatory Visit: Payer: Medicare Other | Attending: Cardiothoracic Surgery | Admitting: Physical Therapy

## 2019-10-15 ENCOUNTER — Encounter: Payer: Self-pay | Admitting: Physical Therapy

## 2019-10-15 ENCOUNTER — Other Ambulatory Visit: Payer: Self-pay

## 2019-10-15 DIAGNOSIS — M6281 Muscle weakness (generalized): Secondary | ICD-10-CM | POA: Diagnosis not present

## 2019-10-15 DIAGNOSIS — R262 Difficulty in walking, not elsewhere classified: Secondary | ICD-10-CM

## 2019-10-15 NOTE — Addendum Note (Signed)
Addended by: Blanche East E on: 10/15/2019 02:50 PM   Modules accepted: Orders

## 2019-10-15 NOTE — Therapy (Signed)
[place] MAIN [facility] SERVICES [place], [place], [address] Phone: [phone]   Fax:  [phone]  Physical Therapy Evaluation  Patient Details  Name: Roberto [name] MRN: [id number] Date of Birth: Oct 05, 1955 Referring Provider (PT): Fredrich Romans MD   Encounter Date: 10/15/2019  PT End of Session - 10/15/19 0913    Visit Number  1    Number of Visits  17    Date for PT Re-Evaluation  12/10/19    Authorization Type  medicare/medicaid    PT Start Time  0835    PT Stop Time  0930    PT Time Calculation (min)  55 min    Activity Tolerance  Patient tolerated treatment well    Behavior During Therapy  [facility] for tasks assessed/performed       Past Medical History:  Diagnosis Date  . Bicuspid aortic valve    a. s/p #27 Carbomedics mechanical valve on 03/25/2010; b. on Coumadin; c. TTE 12/17: EF 40-45%, moderately dilated LV with moderate LVH, AVR well-seated with 14 mmHg gradient, peak AV velocity 2.5 m/s, mild mitral valve thickening with mild MR, mildly dilated RV with mildly reduced contraction  . Cellulitis   . Chronic systolic CHF (congestive heart failure) ([place])    a. R/LHC 03/2010 showed no significant CAD, LVEDP 31 mmHg, mean AoV gradient 34 mmHg at rest and 47 mmHg with dobutamine 20 mcg/kg/min, AVA 1.0 cm^2, RA 31, RV 68/25, PA 68/47, PCWP 38. PA sat 65%. CO 6.2 L/min ([place]) and 5.3 L/min (thermodilution)  . Clotting disorder ([place])   . H/O mechanical aortic valve replacement 03/25/2010   a. #27 Carbomedics mechanical valve  . Hypercholesterolemia   . Renal infarct [facility]) 2017   Multiple right renal infarcts, likely embolic.  . Stroke ([place])   . TIA (transient ischemic attack) 05/2014    Past Surgical History:  Procedure Laterality Date  . AORTIC VALVE REPLACEMENT    . CARDIAC CATHETERIZATION  03/21/2010   No significant CAD. Severe aortic stenosis. Severely elevated left and right heart filling pressures.  Marland Kitchen CARDIAC  SURGERY  2009   CHF  . CARPAL TUNNEL RELEASE Left 2005  . RIGHT HEART CATH AND CORONARY ANGIOGRAPHY N/A 01/28/2019   Procedure: RIGHT HEART CATH AND CORONARY ANGIOGRAPHY;  Surgeon: Nelva Bush, MD;  Location: [place] CV LAB;  Service: Cardiovascular;  Laterality: N/A;  . TONSILLECTOMY  1962    There were no vitals filed for this visit.   Subjective Assessment - 10/15/19 0841    Subjective  "I need to lose weight for cardiac surgery. I have the most success walking on the treadmill, but I can only walk 2 min due to back pain and fatigue."    Pertinent History  64 yo Male has a 5.7cm aortic aneursym. He is a surgical candidate however surgeon would like for patient to lose weight first. His surgeon would like for him to lose as much weight as possible, no specific goal give. He has a follow up in 6 months. He has stopped smoking 6 mo ago. Patient reports chronic low back pain as a result of obesity and arthritis. He reports increased back pain with standing/walking. He does have radicular symptoms down posterior leg to mid thigh with increased back pain. He reports occasional numbness/tingling which resolves with sitting. He denies any recent falls. He presents to therapy without AD.    Limitations  Standing;Walking    How long can you sit comfortably?  NA  How long can you stand comfortably?  3-5 min    How long can you walk comfortably?  100 feet    Diagnostic tests  had lumbar x-ray in May 2020 which showed significant narrowing/disc disease at L5-S1;    Patient Stated Goals  to lose weight    Currently in Pain?  No/denies    Multiple Pain Sites  No         [facility] PT Assessment - 10/15/19 0001      Assessment   Medical Diagnosis  Weakness    Referring Provider (PT)  broadus Atkins MD    Onset Date/Surgical Date  --   several years;    Hand Dominance  Left    Next MD Visit  --   in 6 months   Prior Therapy  denies any PT for this condition;       Precautions    Precautions  --   cardiac   Required Braces or Orthoses  --   none     Restrictions   Weight Bearing Restrictions  No    Other Position/Activity Restrictions  --   Avoid excessive abdominal vasal construction     Balance Screen   Has the patient fallen in the past 6 months  No    Has the patient had a decrease in activity level because of a fear of falling?   No    Is the patient reluctant to leave their home because of a fear of falling?   No      Home Environment   Additional Comments  lives in multi story home, has 1 step to enter; bedroom/bathroom on main floor; negotiates steps ascending reciprocally, descending non-reciprocal; mod I for self care ADLs; friend helps with some housework and does some cooking;       Prior Function   Level of Independence  Independent with basic ADLs    Vocation  On disability    Leisure  lift weights, walk on treadmill,    would walk for 1 hour on treadmill approximately 1-2 year ag     Cognition   Overall Cognitive Status  Within Functional Limits for tasks assessed      Observation/Other Assessments   Focus on Therapeutic Outcomes (FOTO)   54/100      Sensation   Light Touch  Appears Intact    Additional Comments  reports intermittent numbness in posterior leg/thigh with increased back pain; not present at evaluation      Coordination   Gross Motor Movements are Fluid and Coordinated  Yes    Fine Motor Movements are Fluid and Coordinated  No   has tremor in left hand- last 2 years     Posture/Postural Control   Posture Comments  sits with slumped posture, able to self correct with slight discomfort      AROM   Overall AROM Comments  BUE are [facility]      Strength   Overall Strength Comments  BUE are [facility]    Right/Left Hip  Right;Left    Right Hip Flexion  4-/5    Right Hip Extension  4/5    Right Hip ABduction  4/5    Right Hip ADduction  3-/5    Left Hip Flexion  3+/5    Left Hip Extension  4-/5   painful   Left Hip ABduction   4-/5    Left Hip ADduction  3-/5    Right/Left Knee  Right;Left    Right Knee Flexion  5/5    Right Knee Extension  5/5    Left Knee Flexion  4/5    Left Knee Extension  4/5    Right/Left Ankle  Right;Left    Right Ankle Dorsiflexion  4/5    Right Ankle Plantar Flexion  4/5    Left Ankle Dorsiflexion  4/5    Left Ankle Plantar Flexion  4/5      Straight Leg Raise   Findings  Negative    Side   --   bilaterally     Transfers   Comments  mod I for sit<>Stand transfers      Ambulation/Gait   Gait Comments  pt ambulates with reciprocal gait pattern, wide base of support, slightly slower gait speed, good foot clearance,       6 Minute Walk- Baseline   6 Minute Walk- Baseline  yes    BP (mmHg)  98/62    HR (bpm)  64      6 Minute walk- Post Test   6 Minute Walk Post Test  yes    BP (mmHg)  127/76    HR (bpm)  93    02 Sat (%RA)  97 %    Modified Borg Scale for Dyspnea  5- Strong or hard breathing      6 minute walk test results    Aerobic Endurance Distance Walked  1140    Endurance additional comments  without AD, community ambulator, less than age group norms of >1700 feet      Standardized Balance Assessment   Five times sit to stand comments   12.48 sec without HHA (low fall risk)                Objective measurements completed on examination: See above findings.              PT Education - 10/15/19 0912    Education Details  plan of care/recommendation    Person(s) Educated  Patient    Methods  Explanation    Comprehension  Verbalized understanding       PT Short Term Goals - 10/15/19 1436      PT SHORT TERM GOAL #1   Title  Patient will be adherent to HEP at least 3x a week to improve functional strength and balance for better safety at home.    Time  4    Period  Weeks    Status  New    Target Date  11/12/19      PT SHORT TERM GOAL #2   Title  Patient will increase BLE gross strength to 4+/5 as to improve functional strength for  independent gait, increased standing tolerance and increased ADL ability.    Time  4    Period  Weeks    Status  New    Target Date  11/12/19        PT Long Term Goals - 10/15/19 1437      PT LONG TERM GOAL #1   Title  Patient will improve FOTO to >60/100 to indicate improvement in functional mobility and ADLs.    Time  8    Period  Weeks    Status  New    Target Date  12/10/19      PT LONG TERM GOAL #2   Title  Patient will increase six minute walk test distance to >1300 for progression to community ambulator and improve gait ability closer to age group norms    Time  8  Period  Weeks    Status  New    Target Date  12/10/19      PT LONG TERM GOAL #3   Title  Patient will tolerate standing/walking for >15 min with a maximum of 4/10 low back pain for increased weight bearing activity to promote weight loss.    Time  8    Period  Weeks    Status  New    Target Date  12/10/19             Plan - 10/15/19 0913    Clinical Impression Statement  64 yo Male presents to therapy with medical history of Aortic Aneurysm. He is a surgical candidate but his surgeon has requested that patient lose weight prior to surgery. He reports walking on the treadmill has helped him in the past however due to severe back pain he is only able to walk for 14min at a time. Patient is obese. He does exhibit some weakness in BLE particularly in hip (L>R). He was able to complete a 6 min walk test as a community ambulator but is less than age group norms and did require 1 rest break due to back pain and shortness of breath. He would benefit from skilled PT intervention to improve strength and mobility to help patient reach weight loss goals.    Personal Factors and Comorbidities  Comorbidity 3+    Comorbidities  HTN (controlled), DM (diet controlled), HLD, CHF, COPD, sleep apnea, AAA (5.7 cm), obesity, former smoker    Examination-Activity Limitations  Bend;Caring for Others;Carry;Lift;Locomotion  Level;Squat;Stairs;Stand;Transfers    Examination-Participation Restrictions  Church;Cleaning;Community Activity;Laundry;Shop;Volunteer;Yard Work    Merchant [employer] officer  Stable/Uncomplicated    Designer, jewellery  Low    Rehab Potential  Good    PT Frequency  2x / week    PT Duration  8 weeks    PT Treatment/Interventions  Cryotherapy;Moist Heat;Gait training;Stair training;Functional mobility training;Therapeutic activities;Therapeutic exercise;Balance training;Patient/family education;Energy conservation    PT Next Visit Plan  address [place]  will address next visit    Consulted and Agree with Plan of Care  Patient       Patient will benefit from skilled therapeutic intervention in order to improve the following deficits and impairments:  Abnormal gait, Decreased balance, Decreased endurance, Decreased mobility, Difficulty walking, Obesity, Improper body mechanics, Cardiopulmonary status limiting activity, Decreased activity tolerance, Decreased strength, Postural dysfunction, Pain  Visit Diagnosis: Muscle weakness (generalized)  Difficulty in walking, not elsewhere classified     Problem List Patient Active Problem List   Diagnosis Date Noted  . Coronary artery disease involving native coronary artery of native heart without angina pectoris 09/10/2019  . Osteoarthritis of spine with radiculopathy, lumbar region 11/21/2018  . History of aortic stenosis 06/05/2018  . Chronic gout without tophus 04/03/2018  . Valvular heart disease 01/17/2018  . Morbid obesity ([place]) 01/17/2018  . Chronic systolic heart failure ([place]) 09/27/2016  . Renal infarct ([place]) 05/01/2016  . Diabetes mellitus type 2, diet-controlled ([place]) 04/19/2016  . Chronic bilateral low back pain without sciatica 04/19/2016  . Thoracic aortic aneurysm without rupture ([place]) 03/16/2016  . Aortic atherosclerosis ([place]) 03/16/2016  . Tobacco abuse 03/14/2016  . NICM (nonischemic  cardiomyopathy) ([place]) 03/02/2016  . COPD (chronic obstructive pulmonary disease) ([place]) 09/09/2015  . H/O mechanical aortic valve replacement 02/09/2015  . Essential hypertension 02/09/2015  . Mixed hyperlipidemia 02/09/2015    Pearlean Sabina PT, DPT 10/15/2019, 2:48 PM  [place]  [place] MAIN [facility] SERVICES [place], [place], [address] Phone: [phone]   Fax:  [phone]  Name: Roberto [name] MRN: [id number] Date of Birth: [birth year]

## 2019-10-17 ENCOUNTER — Ambulatory Visit (INDEPENDENT_AMBULATORY_CARE_PROVIDER_SITE_OTHER): Payer: Medicare Other | Admitting: Family Medicine

## 2019-10-17 ENCOUNTER — Other Ambulatory Visit: Payer: Self-pay

## 2019-10-17 ENCOUNTER — Encounter: Payer: Self-pay | Admitting: Family Medicine

## 2019-10-17 VITALS — BP 108/68 | HR 72 | Temp 97.9°F | Wt 347.6 lb

## 2019-10-17 DIAGNOSIS — J441 Chronic obstructive pulmonary disease with (acute) exacerbation: Secondary | ICD-10-CM

## 2019-10-17 DIAGNOSIS — I2510 Atherosclerotic heart disease of native coronary artery without angina pectoris: Secondary | ICD-10-CM

## 2019-10-17 DIAGNOSIS — M1A9XX0 Chronic gout, unspecified, without tophus (tophi): Secondary | ICD-10-CM | POA: Diagnosis not present

## 2019-10-17 DIAGNOSIS — Z114 Encounter for screening for human immunodeficiency virus [HIV]: Secondary | ICD-10-CM

## 2019-10-17 DIAGNOSIS — E119 Type 2 diabetes mellitus without complications: Secondary | ICD-10-CM | POA: Diagnosis not present

## 2019-10-17 DIAGNOSIS — Z952 Presence of prosthetic heart valve: Secondary | ICD-10-CM

## 2019-10-17 DIAGNOSIS — I5022 Chronic systolic (congestive) heart failure: Secondary | ICD-10-CM

## 2019-10-17 DIAGNOSIS — E782 Mixed hyperlipidemia: Secondary | ICD-10-CM

## 2019-10-17 DIAGNOSIS — I428 Other cardiomyopathies: Secondary | ICD-10-CM | POA: Diagnosis not present

## 2019-10-17 DIAGNOSIS — I10 Essential (primary) hypertension: Secondary | ICD-10-CM | POA: Diagnosis not present

## 2019-10-17 DIAGNOSIS — I700 Atherosclerosis of aorta: Secondary | ICD-10-CM | POA: Diagnosis not present

## 2019-10-17 DIAGNOSIS — I712 Thoracic aortic aneurysm, without rupture, unspecified: Secondary | ICD-10-CM

## 2019-10-17 DIAGNOSIS — R3911 Hesitancy of micturition: Secondary | ICD-10-CM | POA: Diagnosis not present

## 2019-10-17 LAB — COAGUCHEK XS/INR WAIVED
INR: 2.7 — ABNORMAL HIGH (ref 0.9–1.1)
Prothrombin Time: 32.8 s

## 2019-10-17 LAB — BAYER DCA HB A1C WAIVED: HB A1C (BAYER DCA - WAIVED): 6.7 % (ref <7.0)

## 2019-10-17 MED ORDER — FLUTICASONE-SALMETEROL 250-50 MCG/DOSE IN AEPB: 1.0000 | INHALATION_SPRAY | RESPIRATORY_TRACT | 6 refills | Status: DC | PRN

## 2019-10-17 MED ORDER — ROSUVASTATIN CALCIUM 40 MG PO TABS: 40.0000 mg | ORAL_TABLET | Freq: Every day | ORAL | 1 refills | Status: DC

## 2019-10-17 NOTE — Assessment & Plan Note (Signed)
Under good control on current regimen. Continue current regimen. Continue to monitor. Call with any concerns. Refills given. Labs drawn today.   

## 2019-10-17 NOTE — Assessment & Plan Note (Signed)
Euvolemic today. Continue to monitor. Continue to follow with cardiology. Call with any concerns.  

## 2019-10-17 NOTE — Progress Notes (Signed)
BP 108/68 (BP Location: Left Arm, Patient Position: Sitting, Cuff Size: Large)   Pulse 72   Temp 97.9 F (36.6 C) (Oral)   Wt (!) 347 lb 9.6 oz (157.7 kg)   SpO2 97%   BMI 56.10 kg/m    Subjective:    Patient ID: Roberto [name], male    DOB: 03/22/1956, 64 y.o.   MRN: [id number]  HPI: Roberto [name] is a 64 y.o. male  Chief Complaint  Patient presents with  . Coagulation Disorder  . Diabetes  . Hypertension  . Hyperlipidemia   Coumadin Management.  The expected duration of coumadin treatment is lifelong The reason for anticoagulation is  mechanical heart valve.  Present Coumadin dose: 4, 4, 7.5 repeat Goal: 2.5-3.5  Excessive bruising: no Nose bleeding: no Rectal bleeding: no Prolonged menstrual cycles: N/A Eating diet with consistent amounts of foods containing Vitamin K:yes Any recent antibiotic use? yes  HYPERTENSION / HYPERLIPIDEMIA Satisfied with current treatment? yes Duration of hypertension: chronic BP monitoring frequency: not checking BP medication side effects: no Past BP meds:  Duration of hyperlipidemia: chronic Cholesterol medication side effects: no Cholesterol supplements: none Past cholesterol medications:  Medication compliance: excellent compliance Aspirin: no Recent stressors: no Recurrent headaches: no Visual changes: no Palpitations: no Dyspnea: no Chest pain: no Lower extremity edema: no Dizzy/lightheaded: no  DIABETES Hypoglycemic episodes:no Polydipsia/polyuria: no Visual disturbance: no Chest pain: no Paresthesias: no Glucose Monitoring: no  Accucheck frequency: Not Checking  Fasting glucose: Taking Insulin?: no Blood Pressure Monitoring: not checking Retinal Examination: Not up to Date Foot Exam: Done today Diabetic Education: Completed Pneumovax: Up to Date Influenza: Up to Date Aspirin: no  COPD COPD status: controlled Satisfied with current treatment?: yes Oxygen use: no Dyspnea frequency:  Cough  frequency:  Rescue inhaler frequency:   Limitation of activity: no Productive cough:  Last Spirometry:  Pneumovax: Up to Date Influenza: Up to Date  Gout- no flares  Relevant past medical, surgical, family and social history reviewed and updated as indicated. Interim medical history since our last visit reviewed. Allergies and medications reviewed and updated.  Review of Systems  Constitutional: Negative.   Respiratory: Negative.   Cardiovascular: Negative.   Gastrointestinal: Negative.   Musculoskeletal: Positive for myalgias. Negative for arthralgias, back pain, gait problem, joint swelling, neck pain and neck stiffness.  Skin: Negative.   Neurological: Negative.   Psychiatric/Behavioral: Negative.     Per HPI unless specifically indicated above     Objective:    BP 108/68 (BP Location: Left Arm, Patient Position: Sitting, Cuff Size: Large)   Pulse 72   Temp 97.9 F (36.6 C) (Oral)   Wt (!) 347 lb 9.6 oz (157.7 kg)   SpO2 97%   BMI 56.10 kg/m   Wt Readings from Last 3 Encounters:  10/17/19 (!) 347 lb 9.6 oz (157.7 kg)  09/29/19 (!) 348 lb (157.9 kg)  09/25/19 (!) 348 lb (157.9 kg)    Physical Exam Vitals and nursing note reviewed.  Constitutional:      General: He is not in acute distress.    Appearance: Normal appearance. He is not ill-appearing, toxic-appearing or diaphoretic.  HENT:     Head: Normocephalic and atraumatic.     Right Ear: External ear normal.     Left Ear: External ear normal.     Nose: Nose normal.     Mouth/Throat:     Mouth: Mucous membranes are moist.     Pharynx: Oropharynx is clear.  Eyes:  General: No scleral icterus.       Right eye: No discharge.        Left eye: No discharge.     Extraocular Movements: Extraocular movements intact.     Conjunctiva/sclera: Conjunctivae normal.     Pupils: Pupils are equal, round, and reactive to light.  Cardiovascular:     Rate and Rhythm: Normal rate and regular rhythm.     Pulses:  Normal pulses.     Heart sounds: Normal heart sounds. No murmur. No friction rub. No gallop.   Pulmonary:     Effort: Pulmonary effort is normal. No respiratory distress.     Breath sounds: Normal breath sounds. No stridor. No wheezing, rhonchi or rales.  Chest:     Chest wall: No tenderness.  Musculoskeletal:        General: Normal range of motion.     Cervical back: Normal range of motion and neck supple.  Skin:    General: Skin is warm and dry.     Capillary Refill: Capillary refill takes less than 2 seconds.     Coloration: Skin is not jaundiced or pale.     Findings: No bruising, erythema, lesion or rash.  Neurological:     General: No focal deficit present.     Mental Status: He is alert and oriented to person, place, and time. Mental status is at baseline.  Psychiatric:        Mood and Affect: Mood normal.        Behavior: Behavior normal.        Thought Content: Thought content normal.        Judgment: Judgment normal.     Results for orders placed or performed in visit on 09/03/19  POCT INR  Result Value Ref Range   INR        Assessment & Plan:   Problem List Items Addressed This Visit      Cardiovascular and Mediastinum   Essential hypertension - Primary    Under good control on current regimen. Continue current regimen. Continue to monitor. Call with any concerns. Refills given. Labs drawn today.       Relevant Medications   rosuvastatin (CRESTOR) 40 MG tablet   Other Relevant Orders   CBC with Differential/Platelet   Comprehensive metabolic panel   Microalbumin, Urine Waived   TSH   UA/M w/rflx Culture, Routine   NICM (nonischemic cardiomyopathy) ([place])    Will keep BP and cholesterol and sugars under good control. Continue to follow with cardiology. Call with any concerns.       Relevant Medications   rosuvastatin (CRESTOR) 40 MG tablet   Thoracic aortic aneurysm without rupture (HCC)    Will keep BP and cholesterol and sugars under good control.  Continue to follow with cardiology. Call with any concerns.       Relevant Medications   rosuvastatin (CRESTOR) 40 MG tablet   Aortic atherosclerosis (HCC)    Will keep BP and cholesterol and sugars under good control. Continue to follow with cardiology. Call with any concerns.       Relevant Medications   rosuvastatin (CRESTOR) 40 MG tablet   Chronic systolic heart failure ([place])    Euvolemic today. Continue to monitor. Continue to follow with cardiology. Call with any concerns.       Relevant Medications   rosuvastatin (CRESTOR) 40 MG tablet   Coronary artery disease involving native coronary artery of native heart without angina pectoris    Will keep BP  and cholesterol and sugars under good control. Continue to follow with cardiology. Call with any concerns.       Relevant Medications   rosuvastatin (CRESTOR) 40 MG tablet     Respiratory   COPD (chronic obstructive pulmonary disease) ([place])    Under good control on current regimen. Continue current regimen. Continue to monitor. Call with any concerns. Refills given.        Relevant Medications   Fluticasone-Salmeterol (ADVAIR) 250-50 MCG/DOSE AEPB   Other Relevant Orders   CBC with Differential/Platelet   Comprehensive metabolic panel     Endocrine   Diabetes mellitus type 2, diet-controlled (HCC)    A1c creeping up at 6.7- continue diet and exercise. Continue to monitor. Call with any concerns.       Relevant Medications   rosuvastatin (CRESTOR) 40 MG tablet   Other Relevant Orders   Bayer DCA Hb A1c Waived   CBC with Differential/Platelet   Comprehensive metabolic panel   Microalbumin, Urine Waived     Other   H/O mechanical aortic valve replacement    Doing well with INR of 2.7- continue current regimen. Continue to monitor.       Relevant Orders   Comprehensive metabolic panel   CoaguChek XS/INR Waived   Mixed hyperlipidemia    Under good control on current regimen. Continue current regimen. Continue to  monitor. Call with any concerns. Refills given. Labs drawn today.       Relevant Medications   rosuvastatin (CRESTOR) 40 MG tablet   Other Relevant Orders   CBC with Differential/Platelet   Comprehensive metabolic panel   Lipid Panel w/o Chol/HDL Ratio   Morbid obesity (HCC)    Encouraged diet and exercise with goal of losing 1-2 lbs per week. Call with any concerns.       Chronic gout without tophus    No flares. Labs drawn today. Await results.       Relevant Orders   CBC with Differential/Platelet   Comprehensive metabolic panel   Uric acid    Other Visit Diagnoses    Hesitancy       Labs drawn today. Await results.    Relevant Orders   Comprehensive metabolic panel   PSA   Screening for HIV without presence of risk factors       Labs drawn today. Await results.    Relevant Orders   HIV Antibody (routine testing w rflx)       Follow up plan: Return in about 6 months (around 04/17/2020) for wellness/follow up.

## 2019-10-17 NOTE — Assessment & Plan Note (Signed)
Will keep BP and cholesterol and sugars under good control. Continue to follow with cardiology. Call with any concerns.

## 2019-10-17 NOTE — Assessment & Plan Note (Signed)
Doing well with INR of 2.7- continue current regimen. Continue to monitor.

## 2019-10-17 NOTE — Assessment & Plan Note (Signed)
Encouraged diet and exercise with goal of losing 1-2lbs per week. Call with any concerns.  

## 2019-10-17 NOTE — Assessment & Plan Note (Signed)
No flares. Labs drawn today. Await results.  

## 2019-10-17 NOTE — Assessment & Plan Note (Addendum)
A1c creeping up at 6.7- continue diet and exercise. Continue to monitor. Call with any concerns.

## 2019-10-17 NOTE — Assessment & Plan Note (Signed)
Under good control on current regimen. Continue current regimen. Continue to monitor. Call with any concerns. Refills given.   

## 2019-10-18 LAB — CBC WITH DIFFERENTIAL/PLATELET
Basophils Absolute: 0.1 10*3/uL (ref 0.0–0.2)
Basos: 1 %
EOS (ABSOLUTE): 0.3 10*3/uL (ref 0.0–0.4)
Eos: 4 %
Hematocrit: 40.2 % (ref 37.5–51.0)
Hemoglobin: 13.3 g/dL (ref 13.0–17.7)
Immature Grans (Abs): 0.1 10*3/uL (ref 0.0–0.1)
Immature Granulocytes: 1 %
Lymphocytes Absolute: 2 10*3/uL (ref 0.7–3.1)
Lymphs: 24 %
MCH: 28.7 pg (ref 26.6–33.0)
MCHC: 33.1 g/dL (ref 31.5–35.7)
MCV: 87 fL (ref 79–97)
Monocytes Absolute: 0.7 10*3/uL (ref 0.1–0.9)
Monocytes: 8 %
Neutrophils Absolute: 5.3 10*3/uL (ref 1.4–7.0)
Neutrophils: 62 %
Platelets: 259 10*3/uL (ref 150–450)
RBC: 4.63 x10E6/uL (ref 4.14–5.80)
RDW: 13.1 % (ref 11.6–15.4)
WBC: 8.4 10*3/uL (ref 3.4–10.8)

## 2019-10-18 LAB — COMPREHENSIVE METABOLIC PANEL
ALT: 16 IU/L (ref 0–44)
AST: 20 IU/L (ref 0–40)
Albumin/Globulin Ratio: 1.5 (ref 1.2–2.2)
Albumin: 4.3 g/dL (ref 3.8–4.8)
Alkaline Phosphatase: 51 IU/L (ref 39–117)
BUN/Creatinine Ratio: 16 (ref 10–24)
BUN: 19 mg/dL (ref 8–27)
Bilirubin Total: 0.2 mg/dL (ref 0.0–1.2)
CO2: 24 mmol/L (ref 20–29)
Calcium: 9.3 mg/dL (ref 8.6–10.2)
Chloride: 102 mmol/L (ref 96–106)
Creatinine, Ser: 1.2 mg/dL (ref 0.76–1.27)
GFR calc Af Amer: 73 mL/min/{1.73_m2} (ref >59)
GFR calc non Af Amer: 63 mL/min/{1.73_m2} (ref >59)
Globulin, Total: 2.8 g/dL (ref 1.5–4.5)
Glucose: 132 mg/dL — ABNORMAL HIGH (ref 65–99)
Potassium: 4.4 mmol/L (ref 3.5–5.2)
Sodium: 141 mmol/L (ref 134–144)
Total Protein: 7.1 g/dL (ref 6.0–8.5)

## 2019-10-18 LAB — LIPID PANEL W/O CHOL/HDL RATIO
Cholesterol, Total: 146 mg/dL (ref 100–199)
HDL: 42 mg/dL (ref >39)
LDL Chol Calc (NIH): 44 mg/dL (ref 0–99)
Triglycerides: 412 mg/dL — ABNORMAL HIGH (ref 0–149)
VLDL Cholesterol Cal: 60 mg/dL — ABNORMAL HIGH (ref 5–40)

## 2019-10-18 LAB — HIV ANTIBODY (ROUTINE TESTING W REFLEX): HIV Screen 4th Generation wRfx: NONREACTIVE

## 2019-10-18 LAB — PSA: Prostate Specific Ag, Serum: <0.1 ng/mL (ref 0.0–4.0)

## 2019-10-18 LAB — TSH: TSH: 1.28 u[IU]/mL (ref 0.450–4.500)

## 2019-10-18 LAB — URIC ACID: Uric Acid: 8.1 mg/dL (ref 3.8–8.4)

## 2019-10-20 ENCOUNTER — Other Ambulatory Visit: Payer: Self-pay

## 2019-10-20 ENCOUNTER — Encounter: Payer: Self-pay | Admitting: Physical Therapy

## 2019-10-20 ENCOUNTER — Other Ambulatory Visit: Payer: Medicare Other

## 2019-10-20 ENCOUNTER — Ambulatory Visit: Payer: Medicare Other | Admitting: Physical Therapy

## 2019-10-20 DIAGNOSIS — M6281 Muscle weakness (generalized): Secondary | ICD-10-CM

## 2019-10-20 DIAGNOSIS — E119 Type 2 diabetes mellitus without complications: Secondary | ICD-10-CM | POA: Diagnosis not present

## 2019-10-20 DIAGNOSIS — R262 Difficulty in walking, not elsewhere classified: Secondary | ICD-10-CM | POA: Diagnosis not present

## 2019-10-20 LAB — MICROSCOPIC EXAMINATION
Bacteria, UA: NONE SEEN
WBC, UA: NONE SEEN /hpf (ref 0–5)

## 2019-10-20 LAB — UA/M W/RFLX CULTURE, ROUTINE
Bilirubin, UA: NEGATIVE
Glucose, UA: NEGATIVE
Ketones, UA: NEGATIVE
Leukocytes,UA: NEGATIVE
Nitrite, UA: NEGATIVE
Protein,UA: NEGATIVE
Specific Gravity, UA: 1.02 (ref 1.005–1.030)
Urobilinogen, Ur: 0.2 mg/dL (ref 0.2–1.0)
pH, UA: 6 (ref 5.0–7.5)

## 2019-10-20 LAB — MICROALBUMIN, URINE WAIVED
Creatinine, Urine Waived: 50 mg/dL (ref 10–300)
Microalb, Ur Waived: 10 mg/L (ref 0–19)
Microalb/Creat Ratio: <30 mg/g (ref <30)

## 2019-10-20 NOTE — Patient Instructions (Signed)
Access Code: [email or web address] URL: [email or web address] Date: 10/20/2019 Prepared by: Blanche East  Exercises Seated Diaphragmatic Breathing - 1 x daily - 7 x weekly - 2 sets - 10 reps Seated Shoulder Flexion Full Range Single Arm - 1 x daily - 7 x weekly - 2 sets - 15 reps Seated Hip Flexion March with Ankle Weights - 1 x daily - 7 x weekly - 2 sets - 15 reps Seated Long Arc Quad - 1 x daily - 7 x weekly - 2 sets - 15 reps Walking on Treadmill - 3 x daily - 7 x weekly - 1 sets

## 2019-10-20 NOTE — Therapy (Signed)
[place] MAIN [facility] SERVICES [place], [place], [address] Phone: [phone]   Fax:  [phone]  Physical Therapy Treatment  Patient Details  Name: Roberto [name] MRN: [id number] Date of Birth: 03-05-1956 Referring Provider (PT): Fredrich Romans MD   Encounter Date: 10/20/2019  PT End of Session - 10/20/19 0840    Visit Number  2    Number of Visits  17    Date for PT Re-Evaluation  12/10/19    Authorization Type  medicare/medicaid    PT Start Time  0835    PT Stop Time  0920    PT Time Calculation (min)  45 min    Activity Tolerance  Patient tolerated treatment well    Behavior During Therapy  [facility] for tasks assessed/performed       Past Medical History:  Diagnosis Date  . Bicuspid aortic valve    a. s/p #27 Carbomedics mechanical valve on 03/25/2010; b. on Coumadin; c. TTE 12/17: EF 40-45%, moderately dilated LV with moderate LVH, AVR well-seated with 14 mmHg gradient, peak AV velocity 2.5 m/s, mild mitral valve thickening with mild MR, mildly dilated RV with mildly reduced contraction  . Cellulitis   . Chronic systolic CHF (congestive heart failure) ([place])    a. R/LHC 03/2010 showed no significant CAD, LVEDP 31 mmHg, mean AoV gradient 34 mmHg at rest and 47 mmHg with dobutamine 20 mcg/kg/min, AVA 1.0 cm^2, RA 31, RV 68/25, PA 68/47, PCWP 38. PA sat 65%. CO 6.2 L/min ([place]) and 5.3 L/min (thermodilution)  . Clotting disorder ([place])   . H/O mechanical aortic valve replacement 03/25/2010   a. #27 Carbomedics mechanical valve  . Hypercholesterolemia   . Renal infarct [facility]) 2017   Multiple right renal infarcts, likely embolic.  . Stroke ([place])   . TIA (transient ischemic attack) 05/2014    Past Surgical History:  Procedure Laterality Date  . AORTIC VALVE REPLACEMENT    . CARDIAC CATHETERIZATION  03/21/2010   No significant CAD. Severe aortic stenosis. Severely elevated left and right heart filling pressures.  Marland Kitchen CARDIAC  SURGERY  2009   CHF  . CARPAL TUNNEL RELEASE Left 2005  . RIGHT HEART CATH AND CORONARY ANGIOGRAPHY N/A 01/28/2019   Procedure: RIGHT HEART CATH AND CORONARY ANGIOGRAPHY;  Surgeon: Nelva Bush, MD;  Location: [place] CV LAB;  Service: Cardiovascular;  Laterality: N/A;  . TONSILLECTOMY  1962    There were no vitals filed for this visit.  Subjective Assessment - 10/20/19 0838    Subjective  Patient reports, "I was sore after I left here last time. My back is about the same as always."    Pertinent History  64 yo Male has a 5.7cm aortic aneursym. He is a surgical candidate however surgeon would like for patient to lose weight first. His surgeon would like for him to lose as much weight as possible, no specific goal give. He has a follow up in 6 months. He has stopped smoking 6 mo ago. Patient reports chronic low back pain as a result of obesity and arthritis. He reports increased back pain with standing/walking. He does have radicular symptoms down posterior leg to mid thigh with increased back pain. He reports occasional numbness/tingling which resolves with sitting. He denies any recent falls. He presents to therapy without AD.    Limitations  Standing;Walking    How long can you sit comfortably?  NA    How long can you stand comfortably?  3-5 min  How long can you walk comfortably?  100 feet    Diagnostic tests  had lumbar x-ray in May 2020 which showed significant narrowing/disc disease at L5-S1;    Patient Stated Goals  to lose weight    Currently in Pain?  Yes    Pain Score  5     Pain Location  Back    Pain Orientation  Lower    Pain Descriptors / Indicators  Aching    Pain Type  Chronic pain    Pain Onset  More than a month ago    Pain Frequency  Intermittent    Aggravating Factors   worse with standing/walking    Pain Relieving Factors  rest    Effect of Pain on Daily Activities  decreased activity tolerance;    Multiple Pain Sites  No        TREATMENT: Warm up  on Nustep BUE/BLE level 2 x4 min (unbilled) concurrent with moist heat to low back;   Instructed patient in LE stretches and core strengthening exercise to help improve strength without exacerbating back pain: Seated:   Diaphragmatic breathing with posterior pelvic rock x10 reps with cues to slow down exhalation and improve abdominal activation;  Alternate UE shoulder flexion x15 reps bilaterally; Alternate LE march x15 bilaterally; Alternate LAQ x15 reps bilaterally; Patient required min VCs for proper positioning during exercise for optimal muscle activation and to avoid back discomfort;   Gait on treadmill:  Started at 1.12mph with 2 HHA on arm rests for safety, x1.5 min, then increased to 2.0 mph for 1 min, reduced back to 1.5 for 1.5 minutes and then increase back to 2.0 for additional minute; Required cues to improve posture and increase step length as tolerated; Added to HEP, see patient instructions;   Forward/backward step ups x10 reps leading with each LE with B rail assist;  Standing hip extension SLR x15 reps bilaterally with min VCs to avoid lumbar flexion/extension to isolate hip activation;   Response to treatment: Tolerated well. Patient able to tolerate treadmill without increase in back pain. He does require  Min VCs for proper exercise technique and positioning. Patient reports mild fatigue at end of session;                     PT Education - 10/20/19 0839    Education Details  HEP, strengthening, posture    Person(s) Educated  Patient    Methods  Explanation;Verbal cues    Comprehension  Verbalized understanding;Returned demonstration;Verbal cues required;Need further instruction       PT Short Term Goals - 10/15/19 1436      PT SHORT TERM GOAL #1   Title  Patient will be adherent to HEP at least 3x a week to improve functional strength and balance for better safety at home.    Time  4    Period  Weeks    Status  New    Target Date  11/12/19       PT SHORT TERM GOAL #2   Title  Patient will increase BLE gross strength to 4+/5 as to improve functional strength for independent gait, increased standing tolerance and increased ADL ability.    Time  4    Period  Weeks    Status  New    Target Date  11/12/19        PT Long Term Goals - 10/15/19 1437      PT LONG TERM GOAL #1   Title  Patient will improve FOTO to >60/100 to indicate improvement in functional mobility and ADLs.    Time  8    Period  Weeks    Status  New    Target Date  12/10/19      PT LONG TERM GOAL #2   Title  Patient will increase six minute walk test distance to >1300 for progression to community ambulator and improve gait ability closer to age group norms    Time  8    Period  Weeks    Status  New    Target Date  12/10/19      PT LONG TERM GOAL #3   Title  Patient will tolerate standing/walking for >15 min with a maximum of 4/10 low back pain for increased weight bearing activity to promote weight loss.    Time  8    Period  Weeks    Status  New    Target Date  12/10/19            Plan - 10/20/19 0912    Clinical Impression Statement  Patient tolerated session well. He was instructed in advanced LE strengthening/ROM exercise. He does require min VCs for proper positioning and exercise technique for optimal muscle activation. Patient denies any increase in back pain with advanced exercise. He was able to walk longer on treadmill with less back discomfort this session. educated patient in circuit training to help with cardiovascular conditioning. He would benefit from additional skilled PT intervention to improve strength, balance and mobility;    Personal Factors and Comorbidities  Comorbidity 3+    Comorbidities  HTN (controlled), DM (diet controlled), HLD, CHF, COPD, sleep apnea, AAA (5.7 cm), obesity, former smoker    Examination-Activity Limitations  Bend;Caring for Others;Carry;Lift;Locomotion Level;Squat;Stairs;Stand;Transfers     Examination-Participation Restrictions  Church;Cleaning;Community Activity;Laundry;Shop;Volunteer;Yard Work    Stability/Clinical Decision Making  Stable/Uncomplicated    Rehab Potential  Good    PT Frequency  2x / week    PT Duration  8 weeks    PT Treatment/Interventions  Cryotherapy;Moist Heat;Gait training;Stair training;Functional mobility training;Therapeutic activities;Therapeutic exercise;Balance training;Patient/family education;Energy conservation    PT Next Visit Plan  address [place]  will address next visit    Consulted and Agree with Plan of Care  Patient       Patient will benefit from skilled therapeutic intervention in order to improve the following deficits and impairments:  Abnormal gait, Decreased balance, Decreased endurance, Decreased mobility, Difficulty walking, Obesity, Improper body mechanics, Cardiopulmonary status limiting activity, Decreased activity tolerance, Decreased strength, Postural dysfunction, Pain  Visit Diagnosis: Muscle weakness (generalized)  Difficulty in walking, not elsewhere classified     Problem List Patient Active Problem List   Diagnosis Date Noted  . Coronary artery disease involving native coronary artery of native heart without angina pectoris 09/10/2019  . Osteoarthritis of spine with radiculopathy, lumbar region 11/21/2018  . History of aortic stenosis 06/05/2018  . Chronic gout without tophus 04/03/2018  . Valvular heart disease 01/17/2018  . Morbid obesity ([place]) 01/17/2018  . Chronic systolic heart failure ([place]) 09/27/2016  . Renal infarct ([place]) 05/01/2016  . Diabetes mellitus type 2, diet-controlled ([place]) 04/19/2016  . Chronic bilateral low back pain without sciatica 04/19/2016  . Thoracic aortic aneurysm without rupture ([place]) 03/16/2016  . Aortic atherosclerosis ([place]) 03/16/2016  . Tobacco abuse 03/14/2016  . NICM (nonischemic cardiomyopathy) ([place]) 03/02/2016  . COPD (chronic obstructive pulmonary  disease) ([place]) 09/09/2015  . H/O mechanical aortic  valve replacement 02/09/2015  . Essential hypertension 02/09/2015  . Mixed hyperlipidemia 02/09/2015    Hiroyuki Ozanich PT, DPT 10/20/2019, 9:17 AM  [place] MAIN [facility] SERVICES [address] [place], [place], [address] Phone: [phone]   Fax:  [phone]  Name: Roberto [name] MRN: [id number] Date of Birth: 02-25-1956

## 2019-10-21 ENCOUNTER — Other Ambulatory Visit (HOSPITAL_COMMUNITY): Payer: Medicare Other

## 2019-10-22 ENCOUNTER — Ambulatory Visit: Payer: Medicare Other | Admitting: Physical Therapy

## 2019-10-27 ENCOUNTER — Encounter: Payer: Self-pay | Admitting: Physical Therapy

## 2019-10-27 ENCOUNTER — Other Ambulatory Visit: Payer: Self-pay

## 2019-10-27 ENCOUNTER — Ambulatory Visit: Payer: Medicare Other | Admitting: Physical Therapy

## 2019-10-27 DIAGNOSIS — R262 Difficulty in walking, not elsewhere classified: Secondary | ICD-10-CM

## 2019-10-27 DIAGNOSIS — M6281 Muscle weakness (generalized): Secondary | ICD-10-CM

## 2019-10-27 NOTE — Therapy (Signed)
[place] MAIN [facility] SERVICES [place], [place], [address] Phone: [phone]   Fax:  [phone]  Physical Therapy Treatment  Patient Details  Name: Roberto [name] MRN: [id number] Date of Birth: September 13, 1955 Referring Provider (PT): Fredrich Romans MD   Encounter Date: 10/27/2019  PT End of Session - 10/27/19 0837    Visit Number  3    Number of Visits  17    Date for PT Re-Evaluation  12/10/19    Authorization Type  medicare/medicaid    PT Start Time  0832    PT Stop Time  0915    PT Time Calculation (min)  43 min    Activity Tolerance  Patient tolerated treatment well    Behavior During Therapy  [facility] for tasks assessed/performed       Past Medical History:  Diagnosis Date  . Bicuspid aortic valve    a. s/p #27 Carbomedics mechanical valve on 03/25/2010; b. on Coumadin; c. TTE 12/17: EF 40-45%, moderately dilated LV with moderate LVH, AVR well-seated with 14 mmHg gradient, peak AV velocity 2.5 m/s, mild mitral valve thickening with mild MR, mildly dilated RV with mildly reduced contraction  . Cellulitis   . Chronic systolic CHF (congestive heart failure) ([place])    a. R/LHC 03/2010 showed no significant CAD, LVEDP 31 mmHg, mean AoV gradient 34 mmHg at rest and 47 mmHg with dobutamine 20 mcg/kg/min, AVA 1.0 cm^2, RA 31, RV 68/25, PA 68/47, PCWP 38. PA sat 65%. CO 6.2 L/min ([place]) and 5.3 L/min (thermodilution)  . Clotting disorder ([place])   . H/O mechanical aortic valve replacement 03/25/2010   a. #27 Carbomedics mechanical valve  . Hypercholesterolemia   . Renal infarct [facility]) 2017   Multiple right renal infarcts, likely embolic.  . Stroke ([place])   . TIA (transient ischemic attack) 05/2014    Past Surgical History:  Procedure Laterality Date  . AORTIC VALVE REPLACEMENT    . CARDIAC CATHETERIZATION  03/21/2010   No significant CAD. Severe aortic stenosis. Severely elevated left and right heart filling pressures.  Marland Kitchen CARDIAC  SURGERY  2009   CHF  . CARPAL TUNNEL RELEASE Left 2005  . RIGHT HEART CATH AND CORONARY ANGIOGRAPHY N/A 01/28/2019   Procedure: RIGHT HEART CATH AND CORONARY ANGIOGRAPHY;  Surgeon: Nelva Bush, MD;  Location: [place] CV LAB;  Service: Cardiovascular;  Laterality: N/A;  . TONSILLECTOMY  1962    There were no vitals filed for this visit.  Subjective Assessment - 10/27/19 0832    Subjective  Patient reports doing well. He reports being sore in his legs but his back has been fine. Reports not being able to use his treadmill at home as it is not working; He is supposed to join [employer] later today so that he can use their treadmill.    Pertinent History  64 yo Male has a 5.7cm aortic aneursym. He is a surgical candidate however surgeon would like for patient to lose weight first. His surgeon would like for him to lose as much weight as possible, no specific goal give. He has a follow up in 6 months. He has stopped smoking 6 mo ago. Patient reports chronic low back pain as a result of obesity and arthritis. He reports increased back pain with standing/walking. He does have radicular symptoms down posterior leg to mid thigh with increased back pain. He reports occasional numbness/tingling which resolves with sitting. He denies any recent falls. He presents to therapy without AD.  Limitations  Standing;Walking    How long can you sit comfortably?  NA    How long can you stand comfortably?  3-5 min    How long can you walk comfortably?  100 feet    Diagnostic tests  had lumbar x-ray in May 2020 which showed significant narrowing/disc disease at L5-S1;    Patient Stated Goals  to lose weight    Currently in Pain?  Yes    Pain Score  3     Pain Location  Back    Pain Orientation  Lower    Pain Descriptors / Indicators  Aching;Sore    Pain Type  Chronic pain    Pain Onset  More than a month ago    Pain Frequency  Intermittent    Aggravating Factors   worse with standing/walking     Pain Relieving Factors  rest    Effect of Pain on Daily Activities  decreased activity tolerance;           TREATMENT: Warm up on Nustep BUE/BLE level 2 x4 min (unbilled) concurrent with moist heat to low back;   Instructed patient in LE stretches and core strengthening exercise to help improve strength without exacerbating back pain: Seated:  Diaphragmatic breathing with posterior pelvic rock x10 reps with cues to slow down exhalation and improve abdominal activation; Also required cues to avoid leaning forward/flexing spine;  Alternate UE shoulder flexion x15 reps bilaterally; Alternate 2# LE march x15 bilaterally; Alternate 2# LAQ x15 reps bilaterally; Patient required min VCs for proper positioning during exercise for optimal muscle activation and to avoid back discomfort;   Gait on treadmill:  Started at 1.6mph with 2 HHA on arm rests for safety, x1.5 min, then increased to 2.0 mph for 1 min, Alternated between 1.5 mph and 2.0 mph for circuit training and energy conservation x10 min total; Required cues to improve posture and increase step length as tolerated; Pt denies any increase in back pain but does report increased shortness of breath; vitals monitored: HR  102    [id number]   Ascend/descend 4 steps with B rail assist x4 reps forward reciprocal;   Standing hip extension SLR 2# x15 reps bilaterally with min VCs to avoid lumbar flexion/extension to isolate hip activation;  Standing knee flexion 2# x15 reps each LE for hamstring strengthening  Standing heel raises 2# x15 reps with B rail assist;   Response to treatment: Tolerated well. Patient able to tolerate treadmill without increase in back pain. He does require  Min VCs for proper exercise technique and positioning. Patient reports mild fatigue at end of session;                       PT Education - 10/27/19 0836    Education Details  HEP reinforced, strengthening, posture    Person(s) Educated   Patient    Methods  Explanation;Verbal cues    Comprehension  Verbalized understanding;Returned demonstration;Verbal cues required;Need further instruction       PT Short Term Goals - 10/15/19 1436      PT SHORT TERM GOAL #1   Title  Patient will be adherent to HEP at least 3x a week to improve functional strength and balance for better safety at home.    Time  4    Period  Weeks    Status  New    Target Date  11/12/19      PT SHORT TERM GOAL #2   Title  Patient will increase BLE gross strength to 4+/5 as to improve functional strength for independent gait, increased standing tolerance and increased ADL ability.    Time  4    Period  Weeks    Status  New    Target Date  11/12/19        PT Long Term Goals - 10/15/19 1437      PT LONG TERM GOAL #1   Title  Patient will improve FOTO to >60/100 to indicate improvement in functional mobility and ADLs.    Time  8    Period  Weeks    Status  New    Target Date  12/10/19      PT LONG TERM GOAL #2   Title  Patient will increase six minute walk test distance to >1300 for progression to community ambulator and improve gait ability closer to age group norms    Time  8    Period  Weeks    Status  New    Target Date  12/10/19      PT LONG TERM GOAL #3   Title  Patient will tolerate standing/walking for >15 min with a maximum of 4/10 low back pain for increased weight bearing activity to promote weight loss.    Time  8    Period  Weeks    Status  New    Target Date  12/10/19            Plan - 10/27/19 0840    Clinical Impression Statement  Patient motivated and participated well within session. Advanced LE strengthening, adding ankle weights to challenge LE strengthening. Patient does require min VCs throughout all exercise for optimal posture/positioning to improve muscle activation and avoid injury. He denies any increase in back pain with advanced exercise. Alternated between seated/standing exercise for better tolerance.  Patient would benefit from additional skilled PT intervention to improve strength, balance and gait safety;    Personal Factors and Comorbidities  Comorbidity 3+    Comorbidities  HTN (controlled), DM (diet controlled), HLD, CHF, COPD, sleep apnea, AAA (5.7 cm), obesity, former smoker    Examination-Activity Limitations  Bend;Caring for Others;Carry;Lift;Locomotion Level;Squat;Stairs;Stand;Transfers    Examination-Participation Restrictions  Church;Cleaning;Community Activity;Laundry;Shop;Volunteer;Yard Work    Stability/Clinical Decision Making  Stable/Uncomplicated    Rehab Potential  Good    PT Frequency  2x / week    PT Duration  8 weeks    PT Treatment/Interventions  Cryotherapy;Moist Heat;Gait training;Stair training;Functional mobility training;Therapeutic activities;Therapeutic exercise;Balance training;Patient/family education;Energy conservation    PT Next Visit Plan  address HEP    PT Home Exercise Plan  will address next visit    Consulted and Agree with Plan of Care  Patient       Patient will benefit from skilled therapeutic intervention in order to improve the following deficits and impairments:  Abnormal gait, Decreased balance, Decreased endurance, Decreased mobility, Difficulty walking, Obesity, Improper body mechanics, Cardiopulmonary status limiting activity, Decreased activity tolerance, Decreased strength, Postural dysfunction, Pain  Visit Diagnosis: Difficulty in walking, not elsewhere classified  Muscle weakness (generalized)     Problem List Patient Active Problem List   Diagnosis Date Noted  . Coronary artery disease involving native coronary artery of native heart without angina pectoris 09/10/2019  . Osteoarthritis of spine with radiculopathy, lumbar region 11/21/2018  . History of aortic stenosis 06/05/2018  . Chronic gout without tophus 04/03/2018  . Valvular heart disease 01/17/2018  . Morbid obesity ([place]) 01/17/2018  . Chronic systolic heart failure  (  [place]) 09/27/2016  . Renal infarct ([place]) 05/01/2016  . Diabetes mellitus type 2, diet-controlled ([place]) 04/19/2016  . Chronic bilateral low back pain without sciatica 04/19/2016  . Thoracic aortic aneurysm without rupture ([place]) 03/16/2016  . Aortic atherosclerosis ([place]) 03/16/2016  . Tobacco abuse 03/14/2016  . NICM (nonischemic cardiomyopathy) ([place]) 03/02/2016  . COPD (chronic obstructive pulmonary disease) ([place]) 09/09/2015  . H/O mechanical aortic valve replacement 02/09/2015  . Essential hypertension 02/09/2015  . Mixed hyperlipidemia 02/09/2015    Magali Bray PT, DPT 10/27/2019, 8:57 AM  [place] MAIN [facility] SERVICES [address] [place], [place], [address] Phone: [phone]   Fax:  [phone]  Name: Roberto [name] MRN: [id number] Date of Birth: July 11, 1955

## 2019-10-29 ENCOUNTER — Ambulatory Visit: Payer: Medicare Other | Admitting: Physical Therapy

## 2019-10-29 DIAGNOSIS — Z952 Presence of prosthetic heart valve: Secondary | ICD-10-CM | POA: Diagnosis not present

## 2019-10-29 DIAGNOSIS — Z7901 Long term (current) use of anticoagulants: Secondary | ICD-10-CM | POA: Diagnosis not present

## 2019-11-03 ENCOUNTER — Ambulatory Visit: Payer: Medicare Other | Attending: Cardiothoracic Surgery | Admitting: Physical Therapy

## 2019-11-05 ENCOUNTER — Ambulatory Visit: Payer: Medicare Other | Admitting: Physical Therapy

## 2019-11-10 ENCOUNTER — Ambulatory Visit: Payer: Medicare Other | Admitting: Physical Therapy

## 2019-11-10 ENCOUNTER — Encounter: Payer: Self-pay | Admitting: Physical Therapy

## 2019-11-10 DIAGNOSIS — R262 Difficulty in walking, not elsewhere classified: Secondary | ICD-10-CM

## 2019-11-10 DIAGNOSIS — M6281 Muscle weakness (generalized): Secondary | ICD-10-CM

## 2019-11-10 NOTE — Therapy (Signed)
[place] MAIN [facility] SERVICES [address] [place], [place], [address] Phone: [phone]   Fax:  [phone]  Nov 10, 2019   No Recipients  Physical Therapy Discharge Summary  Patient: Roberto [name]  MRN: [id number]  Date of Birth: January 12, 1956   Diagnosis: Difficulty in walking, not elsewhere classified  Muscle weakness (generalized) Referring Provider (PT): broadus Atkins MD   The above patient had been seen in Physical Therapy 3 times of 7 treatments scheduled with 3 no shows and 1 cancellations. He will be discharged from physical therapy based on patient request.   The treatment consisted of walking on treadmill, core and LE strengthening;  The patient is: Unchanged  Subjective: Patient came to therapy and reported no pain when walking on treadmill or with exercise. However he had 3 no shows and when therapist called to check on him he states, "I was so sore after my last session that I decided I don't want to continue at this time."   Discharge Findings: Patient failed to return for final visit therefore unable to obtain objective measures.   Functional Status at Discharge: unknown  Goals Partially Met    Sincerely,   Jung Ingerson, PT, DPT   CC No Recipients  [place] [address] [place], [place], [address] Phone: [phone]   Fax:  [phone]  Patient: Roberto [name]  MRN: [id number]  Date of Birth: 1956-06-25

## 2019-11-12 ENCOUNTER — Ambulatory Visit: Payer: Medicare Other | Admitting: Physical Therapy

## 2019-11-14 ENCOUNTER — Encounter: Payer: Self-pay | Admitting: Family Medicine

## 2019-11-14 LAB — PROTIME-INR: INR: 3.3 — AB (ref 0.9–1.1)

## 2019-11-17 ENCOUNTER — Ambulatory Visit: Payer: Medicare Other | Admitting: Physical Therapy

## 2019-11-18 ENCOUNTER — Other Ambulatory Visit: Payer: Self-pay

## 2019-11-18 DIAGNOSIS — Z952 Presence of prosthetic heart valve: Secondary | ICD-10-CM

## 2019-11-19 ENCOUNTER — Ambulatory Visit: Payer: Medicare Other | Admitting: Physical Therapy

## 2019-11-19 ENCOUNTER — Other Ambulatory Visit: Payer: Self-pay | Admitting: Family Medicine

## 2019-11-21 ENCOUNTER — Telehealth: Payer: Self-pay | Admitting: Family Medicine

## 2019-11-21 NOTE — Telephone Encounter (Signed)
Called [facility]. Rx was picked up as the brand name Ventolin. Not sure what the patient's significant other is talking about. Called and LVM asking for her to please return my call.

## 2019-11-21 NOTE — Telephone Encounter (Signed)
Copied from [place] [id number]. Topic: General - Other >> Nov 21, 2019  1:56 PM Rainey Pines A wrote: Patient is requesting that the office get in touch with the pharmacy in regards to the albuterol medication request. Patient states that the pharmacy advised them to call the office due to electronic requests no longer being accepted from their pharmacy and the pharmacy has tried faxing over prescription multiple times. Please advise [facility] [address]), [place] [address]  Phone:  [phone] Fax:  [phone]

## 2019-11-22 ENCOUNTER — Encounter: Payer: Self-pay | Admitting: Family Medicine

## 2019-11-22 LAB — PROTIME-INR: INR: 3.1 — AB (ref 0.9–1.1)

## 2019-11-24 ENCOUNTER — Ambulatory Visit: Payer: Medicare Other | Admitting: Physical Therapy

## 2019-11-24 NOTE — Telephone Encounter (Signed)
Called and spoke with [name]. She states that the patient needs a 3 month supply of the albuterol inhaler sent in as it is the same price. She states that she returned the 1 inhaler already.

## 2019-11-25 MED ORDER — ALBUTEROL SULFATE HFA 108 (90 BASE) MCG/ACT IN AERS: INHALATION_SPRAY | RESPIRATORY_TRACT | 1 refills | Status: DC

## 2019-11-25 NOTE — Telephone Encounter (Signed)
Resent for 90-day supply

## 2019-11-26 ENCOUNTER — Ambulatory Visit: Payer: Medicare Other | Admitting: Physical Therapy

## 2019-11-28 ENCOUNTER — Telehealth: Payer: Self-pay

## 2019-12-02 ENCOUNTER — Other Ambulatory Visit: Payer: Self-pay | Admitting: Family Medicine

## 2019-12-02 ENCOUNTER — Other Ambulatory Visit (HOSPITAL_COMMUNITY): Payer: Self-pay | Admitting: Cardiology

## 2019-12-02 DIAGNOSIS — Z952 Presence of prosthetic heart valve: Secondary | ICD-10-CM | POA: Diagnosis not present

## 2019-12-02 DIAGNOSIS — Z7901 Long term (current) use of anticoagulants: Secondary | ICD-10-CM | POA: Diagnosis not present

## 2019-12-02 LAB — POCT INR: INR: 2.8 (ref 2.0–3.0)

## 2019-12-02 NOTE — Telephone Encounter (Signed)
Requested Prescriptions  Pending Prescriptions Disp Refills  . nortriptyline (PAMELOR) 10 MG capsule [Pharmacy Med Name: Nortriptyline HCl 10 MG Oral Capsule] 90 capsule 0    Sig: Take 1 capsule by mouth at bedtime     Psychiatry:  Antidepressants - Heterocyclics (TCAs) Passed - 12/02/2019 12:31 PM      Passed - Valid encounter within last 6 months    Recent Outpatient Visits          1 month ago Essential hypertension   [place], [name], DO   2 months ago Seasonal allergic rhinitis due to pollen   [facility], [name], DO   3 months ago Upper respiratory tract infection, unspecified type   [facility] [place], [name], DO   5 months ago COPD exacerbation [facility])   [place], [name], DO   5 months ago Diabetes mellitus type 2, diet-controlled [facility])   [place], Lilia Argue, [place]      Future Appointments            [place], Harrell Gave, MD [facility], [facility]   In 4 months [place], Barb Merino, DO [place], [place]   In 7 months  [employer], [place]           . allopurinol (ZYLOPRIM) 100 MG tablet [Pharmacy Med Name: Allopurinol 100 MG Oral Tablet] 90 tablet 0    Sig: Take 1 tablet by mouth once daily     Endocrinology:  Gout Agents Passed - 12/02/2019 12:31 PM      Passed - Uric Acid in normal range and within 360 days    Uric Acid  Date Value Ref Range Status  10/17/2019 8.1 3.8 - 8.4 mg/dL Final    Comment:               Therapeutic target for gout patients: <6.0         Passed - Cr in normal range and within 360 days    Creatinine  Date Value Ref Range Status  05/25/2014 0.85 0.60 - 1.30 mg/dL Final   Creatinine, Ser  Date Value Ref Range Status  10/17/2019 1.20 0.76 - 1.27 mg/dL Final         Passed - Valid encounter within last 12 months    Recent Outpatient Visits          1 month ago Essential hypertension   [place], [name], DO   2 months ago Seasonal allergic rhinitis due to pollen   [facility], [name], DO   3 months ago Upper respiratory tract infection, unspecified type   [name] [place], [name], DO   5 months ago COPD exacerbation [facility])   [place], [name], DO   5 months ago Diabetes mellitus type 2, diet-controlled ([place])   [place], Lilia Argue, [place]      Future Appointments            [place], Harrell Gave, MD [facility], [facility]   In 4 months [place], Barb Merino, DO [employer], [place]   In 7 months  [employer], [place]

## 2019-12-03 ENCOUNTER — Ambulatory Visit: Payer: Medicare Other | Admitting: Internal Medicine

## 2019-12-03 NOTE — Progress Notes (Deleted)
Follow-up Outpatient Visit Date: 12/03/2019  Primary Care Provider: Valerie Roys, DO [address] [place] [address]  Chief Complaint: ***  HPI:  Roberto [name] is a 64 y.o. male with history of mechanical aortic valve replacement,non-obstructive coronary artery disease, thoracic aortic aneurysmstroke, right renal infarct, chronic systolic heart failure (LVEF 40-45%), hyperlipidemia,and morbid obesity, who presents for follow-up of cardiomyopathy and valvular heart disease.  I last saw [name] in early March, at which time he reported stable exertional dyspnea.  He was otherwise feeling well.  He subsequently followed up with Dr. Julien Girt, who recommended continued abstinence from tobacco as well as weight loss prior to moving forward with thoracic aortic aneurysm repair.  --------------------------------------------------------------------------------------------------  Past Medical History:  Diagnosis Date  . Bicuspid aortic valve    a. s/p #27 Carbomedics mechanical valve on 03/25/2010; b. on Coumadin; c. TTE 12/17: EF 40-45%, moderately dilated LV with moderate LVH, AVR well-seated with 14 mmHg gradient, peak AV velocity 2.5 m/s, mild mitral valve thickening with mild MR, mildly dilated RV with mildly reduced contraction  . Cellulitis   . Chronic systolic CHF (congestive heart failure) ([place])    a. R/LHC 03/2010 showed no significant CAD, LVEDP 31 mmHg, mean AoV gradient 34 mmHg at rest and 47 mmHg with dobutamine 20 mcg/kg/min, AVA 1.0 cm^2, RA 31, RV 68/25, PA 68/47, PCWP 38. PA sat 65%. CO 6.2 L/min ([place]) and 5.3 L/min (thermodilution)  . Clotting disorder ([place])   . H/O mechanical aortic valve replacement 03/25/2010   a. #27 Carbomedics mechanical valve  . Hypercholesterolemia   . Renal infarct [facility]) 2017   Multiple right renal infarcts, likely embolic.  . Stroke ([place])   . TIA (transient ischemic attack) 05/2014   Past Surgical History:  Procedure Laterality Date  . AORTIC  VALVE REPLACEMENT    . CARDIAC CATHETERIZATION  03/21/2010   No significant CAD. Severe aortic stenosis. Severely elevated left and right heart filling pressures.  Marland Kitchen CARDIAC SURGERY  2009   CHF  . CARPAL TUNNEL RELEASE Left 2005  . RIGHT HEART CATH AND CORONARY ANGIOGRAPHY N/A 01/28/2019   Procedure: RIGHT HEART CATH AND CORONARY ANGIOGRAPHY;  Surgeon: Nelva Bush, MD;  Location: [facility];  Service: Cardiovascular;  Laterality: N/A;  . TONSILLECTOMY  1962   No outpatient medications have been marked as taking for the 12/03/19 encounter (Appointment) with [place], Harrell Gave, MD.    Allergies: Patient has no known allergies.  Social History   Tobacco Use  . Smoking status: Former Smoker    Packs/day: 0.50    Years: 46.00    Pack years: 23.00    Types: Cigarettes    Start date: 07/16/2017    Quit date: 05/11/2019    Years since quitting: 0.5  . Smokeless tobacco: Never Used  Substance Use Topics  . Alcohol use: No    Alcohol/week: 0.0 standard drinks  . Drug use: No    Family History  Problem Relation Age of Onset  . Arthritis Mother   . Dementia Mother   . Colon cancer Mother   . Arthritis Father   . Diabetes Father   . Stroke Father   . Colon cancer Father   . Heart attack Brother   . Breast cancer Sister   . Seizures Sister   . Cancer Brother        brain  . Heart disease Brother   . Heart attack Brother     Review of Systems: A 12-system  review of systems was performed and was negative except as noted in the HPI.  --------------------------------------------------------------------------------------------------  Physical Exam: There were no vitals taken for this visit.  General:  *** HEENT: No conjunctival pallor or scleral icterus. Facemask in place. Neck: Supple without lymphadenopathy, thyromegaly, JVD, or HJR. Lungs: Normal work of breathing. Clear to auscultation bilaterally without wheezes or crackles. Heart: Regular rate and rhythm  without murmurs, rubs, or gallops. Non-displaced PMI. Abd: Bowel sounds present. Soft, NT/ND without hepatosplenomegaly Ext: No lower extremity edema. Radial, PT, and DP pulses are 2+ bilaterally. Skin: Warm and dry without rash.  EKG:  ***  Lab Results  Component Value Date   WBC 8.4 10/17/2019   HGB 13.3 10/17/2019   HCT 40.2 10/17/2019   MCV 87 10/17/2019   PLT 259 10/17/2019    Lab Results  Component Value Date   NA 141 10/17/2019   K 4.4 10/17/2019   CL 102 10/17/2019   CO2 24 10/17/2019   BUN 19 10/17/2019   CREATININE 1.20 10/17/2019   GLUCOSE 132 (H) 10/17/2019   ALT 16 10/17/2019    Lab Results  Component Value Date   CHOL 146 10/17/2019   HDL 42 10/17/2019   LDLCALC 44 10/17/2019   LDLDIRECT 96.4 06/04/2019   TRIG 412 (H) 10/17/2019   CHOLHDL 4.3 08/08/2019    --------------------------------------------------------------------------------------------------  ASSESSMENT AND PLAN: Harrell Gave Ashawn Rinehart, MD 12/03/2019 7:20 AM

## 2019-12-09 ENCOUNTER — Encounter: Payer: Self-pay | Admitting: Family Medicine

## 2019-12-09 LAB — POCT INR: INR: 2.8 (ref 2.0–3.0)

## 2019-12-19 LAB — POCT INR: INR: 2.6 (ref 2.0–3.0)

## 2020-01-01 LAB — POCT INR: INR: 3 (ref 2.0–3.0)

## 2020-01-09 LAB — POCT INR: INR: 3.1 — AB (ref 2.0–3.0)

## 2020-01-12 ENCOUNTER — Other Ambulatory Visit: Payer: Self-pay

## 2020-01-12 MED ORDER — METOPROLOL SUCCINATE ER 100 MG PO TB24: ORAL_TABLET | ORAL | 0 refills | Status: DC

## 2020-01-14 ENCOUNTER — Ambulatory Visit: Payer: Medicare Other | Admitting: Internal Medicine

## 2020-01-15 NOTE — Progress Notes (Signed)
Office Visit    Patient Name: Roberto [name] Date of Encounter: 01/16/2020  Primary Care Provider:  Valerie Roys, DO Primary Cardiologist:  Nelva Bush, MD Electrophysiologist:  None   Chief Complaint    Roberto [name] is a 64 y.o. male with a hx of mechanical aortic valve replacement, non obstructive coronary artery disease, tobacco use, thoracic aortic aneurysm stroke, right renal infarct, chronic systolic heart failure (LVEF 40-45%), HLD, morbid obesity, NICM presents today for follow up of heart failure.   Past Medical History    Past Medical History:  Diagnosis Date  . Bicuspid aortic valve    a. s/p #27 Carbomedics mechanical valve on 03/25/2010; b. on Coumadin; c. TTE 12/17: EF 40-45%, moderately dilated LV with moderate LVH, AVR well-seated with 14 mmHg gradient, peak AV velocity 2.5 m/s, mild mitral valve thickening with mild MR, mildly dilated RV with mildly reduced contraction  . Cellulitis   . Chronic systolic CHF (congestive heart failure) ([place])    a. R/LHC 03/2010 showed no significant CAD, LVEDP 31 mmHg, mean AoV gradient 34 mmHg at rest and 47 mmHg with dobutamine 20 mcg/kg/min, AVA 1.0 cm^2, RA 31, RV 68/25, PA 68/47, PCWP 38. PA sat 65%. CO 6.2 L/min ([place]) and 5.3 L/min (thermodilution)  . Clotting disorder ([place])   . H/O mechanical aortic valve replacement 03/25/2010   a. #27 Carbomedics mechanical valve  . Hypercholesterolemia   . Renal infarct [facility]) 2017   Multiple right renal infarcts, likely embolic.  . Stroke ([place])   . TIA (transient ischemic attack) 05/2014   Past Surgical History:  Procedure Laterality Date  . AORTIC VALVE REPLACEMENT    . CARDIAC CATHETERIZATION  03/21/2010   No significant CAD. Severe aortic stenosis. Severely elevated left and right heart filling pressures.  Marland Kitchen CARDIAC SURGERY  2009   CHF  . CARPAL TUNNEL RELEASE Left 2005  . RIGHT HEART CATH AND CORONARY ANGIOGRAPHY N/A 01/28/2019   Procedure: RIGHT HEART CATH AND  CORONARY ANGIOGRAPHY;  Surgeon: Nelva Bush, MD;  Location: [place];  Service: Cardiovascular;  Laterality: N/A;  . TONSILLECTOMY  1962    Allergies  No Known Allergies  History of Present Illness    [name] is a 64 y.o. male with a hx of bicuspid aortic valve s/p mechanical aortic valve replacement, non obstructive coronary artery disease, thoracic aortic aneurysm, stroke, right renal infarct, tobacco use, chronic systolic heart failure (LVEF 40-45%), HLD, morbid obesity, NICM last seen 09/10/19 by Dr. Saunders Revel.  He also follows with Dr. Aundra Dubin in the advanced HF clinic as well as Dr. Orvan Seen of [facility] regarding his thoracic aortic aneurysm.  Seen by Dr. Orvan Seen 09/29/19 with 5.7 cm ascending aortic aneurysm. Recommended for 6 month follow up with repeat scan as he had recently stopped smoking and required weight loss prior to surgery. Does meet criteria for repair by measurement.   R/LHC 01/2019 with moderate single-vessel CAD with 60% stenosis of large first diagonal branch. Otherwise, mild disease of mid LAd and prox RCA. Echo 01/2019 with LVEF 40-45%, impaired diastolic relaxation, RV pressure mildly elevated 35.4mmHg, LA moderately dilated, mild AV stenosis (mean gradient 8mmHg), dilation ascending aorta 4.6cm.   Presents for 4-month follow-up.  He is up 6 pounds from office visit 3 months ago.  He endorses leaving a mostly sedentary lifestyle.  He was previously participating in PT but has since discontinued but does endorse he is trying to do some of his exercises like walking the stairs at home.  Tells me his exercise tolerance is limited by his weight.  He eats mostly at endorses eating foods such as pizza.  We discussed need to change his diet in order to lose weight.  Reports no chest pain, pressure, tightness.  Reports no lower extremity edema, orthopnea, PND.  He has been using CPAP intermittently and was encouraged to use it regularly.  He does endorse dyspnea with  exertion he tells me is overall stable compared to previous.  He does not feel that he has fluid volume excess.   EKGs/Labs/Other Studies Reviewed:   The following studies were reviewed today:  EKG:  EKG is ordered today.  The ekg ordered today demonstrates sinus rhythm 84 bpm first-degree AV block PR 212, infrequent PVC, no acute ST/T wave changes.  Recent Labs: 02/06/2019: B Natriuretic Peptide 137.4 10/17/2019: ALT 16; BUN 19; Creatinine, Ser 1.20; Hemoglobin 13.3; Platelets 259; Potassium 4.4; Sodium 141; TSH 1.280  Recent Lipid Panel    Component Value Date/Time   CHOL 146 10/17/2019 1521   CHOL 177 05/17/2017 1311   CHOL 131 05/25/2014 0556   TRIG 412 (H) 10/17/2019 1521   TRIG 361 (H) 05/17/2017 1311   TRIG 167 05/25/2014 0556   HDL 42 10/17/2019 1521   HDL 30 (L) 05/25/2014 0556   CHOLHDL 4.3 08/08/2019 0841   VLDL 74 (H) 08/08/2019 0841   VLDL 72 (H) 05/17/2017 1311   VLDL 33 05/25/2014 0556   LDLCALC 44 10/17/2019 1521   LDLCALC 68 05/25/2014 0556   LDLDIRECT 96.4 06/04/2019 0712    Home Medications   Current Meds  Medication Sig  . ADVAIR DISKUS 250-50 MCG/DOSE AEPB INHALE 1 DOSE BY MOUTH TWICE DAILY  . albuterol (PROVENTIL) (2.5 MG/3ML) 0.083% nebulizer solution Take 3 mLs (2.5 mg total) by nebulization every 6 (six) hours as needed for wheezing or shortness of breath.  Marland Kitchen albuterol (VENTOLIN HFA) 108 (90 Base) MCG/ACT inhaler INHALE 2 PUFFS BY MOUTH EVERY 6 HOURS AS NEEDED FOR WHEEZING OR SHORTNESS OF BREATH  . allopurinol (ZYLOPRIM) 100 MG tablet Take 1 tablet by mouth once daily  . aspirin 81 MG chewable tablet Chew 81 mg by mouth daily.    Marland Kitchen buPROPion (WELLBUTRIN XL) 150 MG 24 hr tablet Take 1 tablet (150 mg total) by mouth daily for 3 days, THEN 1 tablet (150 mg total) 2 (two) times daily.  . cetirizine (ZYRTEC) 10 MG tablet Take 1 tablet (10 mg total) by mouth daily.  . cyclobenzaprine (FLEXERIL) 10 MG tablet Take 1 tablet (10 mg total) by mouth 3 times/day as  needed-between meals & bedtime for muscle spasms.  . fenofibrate (TRICOR) 48 MG tablet Take 1 tablet (48 mg total) by mouth daily.  . furosemide (LASIX) 40 MG tablet Take 1.5 tablets (60 mg total) by mouth 2 (two) times daily.  Marland Kitchen icosapent Ethyl (VASCEPA) 1 g capsule Take 2 capsules (2 g total) by mouth 2 (two) times daily.  Marland Kitchen lisinopril (ZESTRIL) 5 MG tablet Take 1 tablet (5 mg total) by mouth daily.  . metoprolol succinate (TOPROL-XL) 100 MG 24 hr tablet TAKE 1 TABLET BY MOUTH ONCE DAILY WITH  OR  IMMEDIATELY  FOLLOWING  A  MEAL  . nortriptyline (PAMELOR) 10 MG capsule Take 1 capsule by mouth at bedtime  . Omega-3 Fatty Acids (FISH OIL) 1000 MG CPDR Take 2,000 mg by mouth daily.  . potassium chloride SA (KLOR-CON) 20 MEQ tablet Take 1 tablet by mouth once daily  . rosuvastatin (CRESTOR) 40 MG tablet  Take 1 tablet (40 mg total) by mouth daily.  Marland Kitchen spironolactone (ALDACTONE) 25 MG tablet Take 1 tablet (25 mg total) by mouth daily.  Marland Kitchen tiotropium (SPIRIVA HANDIHALER) 18 MCG inhalation capsule PLACE ONE CAPSULE INTO INHALER AND INHALE THE CONTENTS BY MOUTH ONCE DAILY  . warfarin (COUMADIN) 4 MG tablet TAKE 1 TABLET BY MOUTH ONCE DAILY AT  6  PM.  TAKE  AS  DIRECTED  . warfarin (COUMADIN) 7.5 MG tablet Take 1 tablet (7.5 mg total) by mouth daily. 4 mg for 2 days, 7.5 mg on the 3rd day, and repeat      Review of Systems   Review of Systems  Constitutional: Negative for chills, fever and malaise/fatigue.  Cardiovascular: Positive for dyspnea on exertion. Negative for chest pain, irregular heartbeat, leg swelling, near-syncope, orthopnea, palpitations and syncope.  Respiratory: Negative for cough, shortness of breath and wheezing.   Gastrointestinal: Negative for melena, nausea and vomiting.  Genitourinary: Negative for hematuria.  Neurological: Negative for dizziness, light-headedness and weakness.   All other systems reviewed and are otherwise negative except as noted above.  Physical Exam      VS:  BP 114/62 (BP Location: Left Arm, Patient Position: Sitting, Cuff Size: Normal)   Pulse 84   Ht 5\' 6"  (1.676 m)   Wt (!) 354 lb (160.6 kg)   SpO2 96%   BMI 57.14 kg/m  , BMI Body mass index is 57.14 kg/m. GEN: Well nourished, morbidly obese, well developed, in no acute distress. HEENT: normal. Neck: Supple, no JVD, carotid bruits, or masses. Cardiac: RRR, mechanical S2, 1/6 systolic murmur, no  rubs, or gallops. No clubbing, cyanosis, edema.  Radials/DP/PT 2+ and equal bilaterally.  Respiratory:  Respirations regular and unlabored, clear to auscultation bilaterally.  Diminished bases likely due to body habitus. GI: Soft, nontender, nondistended, BS + x 4. MS: No deformity or atrophy. Skin: Warm and dry, no rash. Neuro:  Strength and sensation are intact. Psych: Normal affect.  Assessment & Plan    1. Chronic HFrEF/NICM -clinically stable with NYHA III symptoms.  Not volume overloaded at time of exam.  Continue lisinopril 5 mg daily, metoprolol succinate 100 mg daily, furosemide 60 mg twice daily.  Hypertension blood currently precludes escalation of GDMT.  Continue to monitor first-degree AV block in the setting of ongoing metoprolol use.  2. CAD -stable no anginal symptoms.  EKG today no acute ST/T wave changes.  No negation for ischemic evaluation this time.  GDMT includes beta-blocker, statin, aspirin.    3. Valvular heart disease/chronic anticoagulation-s/p mechanical AVR.  Denies bleeding complications on warfarin.  4. Thoracic aortic aneurysm - Continue to follow with Dr. Orvan Seen of [facility].  Upcoming appointment is CT in September.  5. HLD/hypertriglyceridemia -10/17/2019 total cholesterol 146, triglycerides 412, LDL 44.  Need for dietary changes for triglyceride control.  Continue high intensity statin Crestor 40 mg daily, Vascepa 2 g twice daily, fenofibrate 48 mg daily.  6. Morbid obesity -weight loss via diet and exercise encouraged.  BMI 57.14.  Discussed need to avoid  fast food restaurants, pizza.  Discussed methods for portion control.  7. OSA -using CPAP intermittently.  Encouraged to use every night.  We discussed the implications of sleep apnea on heart failure.  8. Tobaccco use -quit approximately 9 months ago.  Continue smoking cessation encouraged.  Disposition: Follow up in 3 month(s) with Dr. Saunders Revel or APP.  He is due for the heart failure clinic follow-up in August and was provided their phone number  to schedule appointment.  Loel Dubonnet, NP 01/16/2020, 8:41 AM

## 2020-01-16 ENCOUNTER — Other Ambulatory Visit: Payer: Self-pay

## 2020-01-16 ENCOUNTER — Encounter: Payer: Self-pay | Admitting: Family

## 2020-01-16 ENCOUNTER — Ambulatory Visit (INDEPENDENT_AMBULATORY_CARE_PROVIDER_SITE_OTHER): Payer: Medicare Other | Admitting: Family

## 2020-01-16 VITALS — BP 114/62 | HR 84 | Ht 66.0 in | Wt 354.0 lb

## 2020-01-16 DIAGNOSIS — E781 Pure hyperglyceridemia: Secondary | ICD-10-CM

## 2020-01-16 DIAGNOSIS — Z952 Presence of prosthetic heart valve: Secondary | ICD-10-CM | POA: Diagnosis not present

## 2020-01-16 DIAGNOSIS — Z7901 Long term (current) use of anticoagulants: Secondary | ICD-10-CM | POA: Diagnosis not present

## 2020-01-16 DIAGNOSIS — I428 Other cardiomyopathies: Secondary | ICD-10-CM | POA: Diagnosis not present

## 2020-01-16 DIAGNOSIS — E876 Hypokalemia: Secondary | ICD-10-CM

## 2020-01-16 DIAGNOSIS — E785 Hyperlipidemia, unspecified: Secondary | ICD-10-CM

## 2020-01-16 DIAGNOSIS — G4733 Obstructive sleep apnea (adult) (pediatric): Secondary | ICD-10-CM | POA: Diagnosis not present

## 2020-01-16 DIAGNOSIS — Z720 Tobacco use: Secondary | ICD-10-CM | POA: Diagnosis not present

## 2020-01-16 DIAGNOSIS — I5020 Unspecified systolic (congestive) heart failure: Secondary | ICD-10-CM

## 2020-01-16 MED ORDER — SPIRONOLACTONE 25 MG PO TABS: 25.0000 mg | ORAL_TABLET | Freq: Every day | ORAL | 1 refills | Status: DC

## 2020-01-16 MED ORDER — FUROSEMIDE 40 MG PO TABS: 60.0000 mg | ORAL_TABLET | Freq: Two times a day (BID) | ORAL | 1 refills | Status: DC

## 2020-01-16 MED ORDER — ICOSAPENT ETHYL 1 G PO CAPS: 2.0000 g | ORAL_CAPSULE | Freq: Two times a day (BID) | ORAL | 5 refills | Status: DC

## 2020-01-16 MED ORDER — POTASSIUM CHLORIDE CRYS ER 20 MEQ PO TBCR: 20.0000 meq | EXTENDED_RELEASE_TABLET | Freq: Every day | ORAL | 1 refills | Status: DC

## 2020-01-16 MED ORDER — LISINOPRIL 5 MG PO TABS: 5.0000 mg | ORAL_TABLET | Freq: Every day | ORAL | 1 refills | Status: DC

## 2020-01-16 MED ORDER — ROSUVASTATIN CALCIUM 40 MG PO TABS: 40.0000 mg | ORAL_TABLET | Freq: Every day | ORAL | 1 refills | Status: DC

## 2020-01-16 NOTE — Patient Instructions (Addendum)
Medication Instructions:  No medication changes today.   *If you need a refill on your cardiac medications before your next appointment, please call your pharmacy*  Lab Work: No lab work today.  Testing/Procedures: Your EKG today was stable compared to previous.   Follow-Up: At [facility], you and your health needs are our priority.  As part of our continuing mission to provide you with exceptional heart care, we have created designated Provider Care Teams.  These Care Teams include your primary Cardiologist (physician) and Advanced Practice Providers (APPs -  Physician Assistants and Nurse Practitioners) who all work together to provide you with the care you need, when you need it.  We recommend signing up for the patient portal called "MyChart".  Sign up information is provided on this After Visit Summary.  MyChart is used to connect with patients for Virtual Visits (Telemedicine).  Patients are able to view lab/test results, encounter notes, upcoming appointments, etc.  Non-urgent messages can be sent to your provider as well.   To learn more about what you can do with MyChart, go to [email or web address].    Your next appointment:   3 month(s)  The format for your next appointment:   In Person  Provider:   You may see Nelva Bush, MD or one of the following Advanced Practice Providers on your designated Care Team:    Murray Hodgkins, NP  Christell Faith, [name]  Marrianne Mood, [name]  Anushree Dorsi, NP  Other Instructions   Due for follow up next month with the [facility] in [place] (Dr. Aundra Dubin). Please call to schedule your appointment. Their phone number is [phone] .   Exercising to Lose Weight Exercise is structured, repetitive physical activity to improve fitness and health. Getting regular exercise is important for everyone. It is especially important if you are overweight. Being overweight increases your risk of heart disease, stroke,  diabetes, high blood pressure, and several types of cancer. Reducing your calorie intake and exercising can help you lose weight. Exercise is usually categorized as moderate or vigorous intensity. To lose weight, most people need to do a certain amount of moderate-intensity or vigorous-intensity exercise each week. Moderate-intensity exercise  Moderate-intensity exercise is any activity that gets you moving enough to burn at least three times more energy (calories) than if you were sitting. Examples of moderate exercise include:  Walking a mile in 15 minutes.  Doing light yard work.  Biking at an easy pace. Most people should get at least 150 minutes (2 hours and 30 minutes) a week of moderate-intensity exercise to maintain their body weight. Vigorous-intensity exercise Vigorous-intensity exercise is any activity that gets you moving enough to burn at least six times more calories than if you were sitting. When you exercise at this intensity, you should be working hard enough that you are not able to carry on a conversation. Examples of vigorous exercise include:  Running.  Playing a team sport, such as football, basketball, and soccer.  Jumping rope. Most people should get at least 75 minutes (1 hour and 15 minutes) a week of vigorous-intensity exercise to maintain their body weight. How can exercise affect me? When you exercise enough to burn more calories than you eat, you lose weight. Exercise also reduces body fat and builds muscle. The more muscle you have, the more calories you burn. Exercise also:  Improves mood.  Reduces stress and tension.  Improves your overall fitness, flexibility, and endurance.  Increases bone strength. The amount of exercise you need  to lose weight depends on:  Your age.  The type of exercise.  Any health conditions you have.  Your overall physical ability. Talk to your health care provider about how much exercise you need and what types of  activities are safe for you. What actions can I take to lose weight? Nutrition   Make changes to your diet as told by your health care provider or diet and nutrition specialist (dietitian). This may include: ? Eating fewer calories. ? Eating more protein. ? Eating less unhealthy fats. ? Eating a diet that includes fresh fruits and vegetables, whole grains, low-fat dairy products, and lean protein. ? Avoiding foods with added fat, salt, and sugar.  Drink plenty of water while you exercise to prevent dehydration or heat stroke. Activity  Choose an activity that you enjoy and set realistic goals. Your health care provider can help you make an exercise plan that works for you.  Exercise at a moderate or vigorous intensity most days of the week. ? The intensity of exercise may vary from person to person. You can tell how intense a workout is for you by paying attention to your breathing and heartbeat. Most people will notice their breathing and heartbeat get faster with more intense exercise.  Do resistance training twice each week, such as: ? Push-ups. ? Sit-ups. ? Lifting weights. ? Using resistance bands.  Getting short amounts of exercise can be just as helpful as long structured periods of exercise. If you have trouble finding time to exercise, try to include exercise in your daily routine. ? Get up, stretch, and walk around every 30 minutes throughout the day. ? Go for a walk during your lunch break. ? Park your car farther away from your destination. ? If you take public transportation, get off one stop early and walk the rest of the way. ? Make phone calls while standing up and walking around. ? Take the stairs instead of elevators or escalators.  Wear comfortable clothes and shoes with good support.  Do not exercise so much that you hurt yourself, feel dizzy, or get very short of breath. Where to find more information  U.S. Department of Health and Human Services:  BondedCompany.at  Centers for Disease Control and Prevention (CDC): http://www.wolf.info/ Contact a health care provider:  Before starting a new exercise program.  If you have questions or concerns about your weight.  If you have a medical problem that keeps you from exercising. Get help right away if you have any of the following while exercising:  Injury.  Dizziness.  Difficulty breathing or shortness of breath that does not go away when you stop exercising.  Chest pain.  Rapid heartbeat. Summary  Being overweight increases your risk of heart disease, stroke, diabetes, high blood pressure, and several types of cancer.  Losing weight happens when you burn more calories than you eat.  Reducing the amount of calories you eat in addition to getting regular moderate or vigorous exercise each week helps you lose weight. This information is not intended to replace advice given to you by your health care provider. Make sure you discuss any questions you have with your health care provider. Document Revised: 07/02/2017 Document Reviewed: 07/02/2017 Elsevier Patient Education  2020 [employer].

## 2020-01-20 ENCOUNTER — Telehealth: Payer: Self-pay

## 2020-01-20 NOTE — Telephone Encounter (Signed)
Icosapent ethyl (Vascepa) 1 gram capsules approved through 07/02/2020 per fax via [place].

## 2020-01-20 NOTE — Telephone Encounter (Signed)
Prior Authorization for Icosapent Ethyl 1 GM completed in [email or web address] KEY: [id number]  RESPONSE: [facility] has received your information, and the request will be reviewed. You will receive an electronic determination in CoverMyMeds. You can see the latest determination by locating this request on your dashboard or by reopening this request. You will also receive a faxed copy of the determination. If you have any questions please contact [facility] at [phone].

## 2020-01-24 LAB — POCT INR: INR: 3 (ref 2.0–3.0)

## 2020-01-26 ENCOUNTER — Ambulatory Visit: Payer: Self-pay

## 2020-01-27 NOTE — Chronic Care Management (AMB) (Signed)
  Care Management   Follow Up Note   01/27/2020 Name: Roberto [name] MRN: [id number] DOB: 08/10/1955  Referred by: Valerie Roys, DO Reason for referral : Care Coordination   Roberto [name] is a 64 y.o. year old male who is a primary care patient of Valerie Roys, DO. The care management team was consulted for assistance with care management and care coordination needs.    Review of patient status, including review of consultants reports, relevant laboratory and other test results, and collaboration with appropriate care team members and the patient's provider was performed as part of comprehensive patient evaluation and provision of chronic care management services.    LCSW completed CCM outreach attempt today but was unable to reach patient successfully. A HIPPA compliant voice message was left encouraging patient to return call once available. LCSW rescheduled CCM SW appointment as well.  A HIPPA compliant phone message was left for the patient providing contact information and requesting a return call.   [name], BSW, MSW, [email or web address] Phone: [phone]

## 2020-02-07 ENCOUNTER — Other Ambulatory Visit: Payer: Self-pay | Admitting: Family Medicine

## 2020-02-07 ENCOUNTER — Encounter: Payer: Self-pay | Admitting: Family Medicine

## 2020-02-07 LAB — PROTIME-INR: INR: 3.1 — AB (ref 0.9–1.1)

## 2020-02-07 NOTE — Telephone Encounter (Signed)
Requested Prescriptions  Pending Prescriptions Disp Refills  . allopurinol (ZYLOPRIM) 100 MG tablet [Pharmacy Med Name: Allopurinol 100 MG Oral Tablet] 90 tablet 0    Sig: Take 1 tablet by mouth once daily     Endocrinology:  Gout Agents Passed - 02/07/2020 11:23 AM      Passed - Uric Acid in normal range and within 360 days    Uric Acid  Date Value Ref Range Status  10/17/2019 8.1 3.8 - 8.4 mg/dL Final    Comment:               Therapeutic target for gout patients: <6.0         Passed - Cr in normal range and within 360 days    Creatinine  Date Value Ref Range Status  05/25/2014 0.85 0.60 - 1.30 mg/dL Final   Creatinine, Ser  Date Value Ref Range Status  10/17/2019 1.20 0.76 - 1.27 mg/dL Final         Passed - Valid encounter within last 12 months    Recent Outpatient Visits          3 months ago Essential hypertension   [place], [name], DO   4 months ago Seasonal allergic rhinitis due to pollen   [facility], [name], DO   5 months ago Upper respiratory tract infection, unspecified type   [place], [name], DO   7 months ago COPD exacerbation [facility])   [place], [name], DO   8 months ago Diabetes mellitus type 2, diet-controlled ([place])   [place], Lilia Argue, [place]      Future Appointments            In 2 months [place], Harrell Gave, MD [facility], [facility]   In 2 months [name], [name], DO [employer], [place]   In 5 months  [employer], [facility]           . nortriptyline (PAMELOR) 10 MG capsule [Pharmacy Med Name: Nortriptyline HCl 10 MG Oral Capsule] 90 capsule 0    Sig: Take 1 capsule by mouth at bedtime     Psychiatry:  Antidepressants - Heterocyclics (TCAs) Passed - 02/07/2020 11:23 AM      Passed - Valid encounter within last 6 months    Recent Outpatient Visits          3 months ago Essential  hypertension   [place], [name], DO   4 months ago Seasonal allergic rhinitis due to pollen   [facility], [name], DO   5 months ago Upper respiratory tract infection, unspecified type   [facility], [name], DO   7 months ago COPD exacerbation [facility])   [place], [name], DO   8 months ago Diabetes mellitus type 2, diet-controlled [facility])   [place], Lilia Argue, [place]      Future Appointments            In 2 months End, Harrell Gave, MD [facility], [facility]   In 2 months [place] Emery, Barb Merino, DO [employer], [place]   In 5 months  [employer], [place]           . ADVAIR DISKUS 250-50 MCG/DOSE AEPB [Pharmacy Med Name: Advair Diskus 250-50 MCG/DOSE Inhalation Aerosol Powder Breath Activated] 180 each 0    Sig: INHALE 1 DOSE BY MOUTH  TWICE DAILY     Pulmonology:  Combination Products Passed - 02/07/2020 11:23 AM      Passed - Valid encounter within last 12 months    Recent Outpatient Visits          3 months ago Essential hypertension   [place], [name], DO   4 months ago Seasonal allergic rhinitis due to pollen   [facility], [name], DO   5 months ago Upper respiratory tract infection, unspecified type   [facility], [name], DO   7 months ago COPD exacerbation [facility])   [place], [name], DO   8 months ago Diabetes mellitus type 2, diet-controlled [facility])   [facility], [place]      Future Appointments            In 2 months End, Harrell Gave, MD [facility], [place]   In 2 months [place] Emery, Barb Merino, DO [employer], [place]   In 5 months  [employer], [place]

## 2020-02-19 ENCOUNTER — Encounter: Payer: Self-pay | Admitting: Family Medicine

## 2020-02-19 LAB — PROTIME-INR: INR: 0 — AB (ref 0.9–1.1)

## 2020-02-24 ENCOUNTER — Other Ambulatory Visit: Payer: Self-pay | Admitting: *Deleted

## 2020-02-24 DIAGNOSIS — I712 Thoracic aortic aneurysm, without rupture, unspecified: Secondary | ICD-10-CM

## 2020-02-27 ENCOUNTER — Encounter: Payer: Self-pay | Admitting: Family Medicine

## 2020-02-27 DIAGNOSIS — Z952 Presence of prosthetic heart valve: Secondary | ICD-10-CM | POA: Diagnosis not present

## 2020-02-27 DIAGNOSIS — Z7901 Long term (current) use of anticoagulants: Secondary | ICD-10-CM | POA: Diagnosis not present

## 2020-02-27 LAB — PROTIME-INR: INR: 3 — AB (ref 0.9–1.1)

## 2020-03-03 ENCOUNTER — Other Ambulatory Visit: Payer: Self-pay | Admitting: Family Medicine

## 2020-03-03 NOTE — Telephone Encounter (Signed)
Requested medication (s) are due for refill today: yes  Requested medication (s) are on the active medication list: yes  Last refill:  12/08/2019  Future visit scheduled: yes   Notes to clinic:  this refill cannot be delegated   Requested Prescriptions  Pending Prescriptions Disp Refills   warfarin (COUMADIN) 4 MG tablet [Pharmacy Med Name: Warfarin Sodium 4 MG Oral Tablet] 90 tablet 0    Sig: TAKE 1 TABLET BY MOUTH ONCE DAILY AT  [place]      Hematology:  Anticoagulants - warfarin Failed - 03/03/2020  9:49 AM      Failed - This refill cannot be delegated      Failed - If the patient is managed by Coumadin Clinic - route to their Pool. If not, forward to the provider.      Failed - INR in normal range and within 30 days    INR  Date Value Ref Range Status  11/22/2019 3.1 (A) 0.9 - 1.1 Final  10/17/2019 2.7 (H) 0.9 - 1.1 Final  05/25/2014 1.9  Final    Comment:    INR reference interval applies to patients on anticoagulant therapy. A single INR therapeutic range for coumarins is not optimal for all indications; however, the suggested range for most indications is 2.0 - 3.0. Exceptions to the INR Reference Range may include: Prosthetic heart valves, acute myocardial infarction, prevention of myocardial infarction, and combinations of aspirin and anticoagulant. The need for a higher or lower target INR must be assessed individually. Reference: The Pharmacology and Management of the Vitamin K  antagonists: the seventh ACCP Conference on Antithrombotic and Thrombolytic Therapy. YKDXI.3382 Sept:126 (3suppl): [id number]. A HCT value >55% may artifactually increase the PT.  In one study,  the increase was an average of 25%. Reference:  "Effect on Routine and Special Coagulation Testing Values of Citrate Anticoagulant Adjustment in Patients with High HCT Values." American Journal of Clinical Pathology 2006;126:400-405.           Failed - Valid encounter within last 3  months    Recent Outpatient Visits           4 months ago Essential hypertension   [place], [name], DO   5 months ago Seasonal allergic rhinitis due to pollen   [facility], [name], DO   6 months ago Upper respiratory tract infection, unspecified type   [facility], [name], DO   8 months ago COPD exacerbation [facility])   [place], [name], DO   8 months ago Diabetes mellitus type 2, diet-controlled [facility])   [place], Lilia Argue, [place]       Future Appointments             In 1 month End, Harrell Gave, MD [facility], [facility]   In 1 month [place], [name], DO [place], [facility]   In 4 months  [employer], [facility]             Signed Prescriptions Disp Refills   tiotropium (SPIRIVA HANDIHALER) 18 MCG inhalation capsule 90 capsule 0    Sig: Inhale 1 puff by mouth once daily      Pulmonology:  Anticholinergic Agents Passed - 03/03/2020  9:49 AM      Passed - Valid encounter within last 12 months    Recent Outpatient Visits           4 months ago  Essential hypertension   [place], [name], DO   5 months ago Seasonal allergic rhinitis due to pollen   [facility], [name], DO   6 months ago Upper respiratory tract infection, unspecified type   [facility], [name], DO   8 months ago COPD exacerbation [facility])   [place], [name], DO   8 months ago Diabetes mellitus type 2, diet-controlled [facility])   [place], Lilia Argue, [place]       Future Appointments             In 1 month End, Harrell Gave, MD [facility], [place]   In 1 month [place], Barb Merino, DO [place], [place]   In 4 months  [employer], [place]

## 2020-03-11 ENCOUNTER — Other Ambulatory Visit: Payer: Self-pay | Admitting: Family Medicine

## 2020-03-11 ENCOUNTER — Other Ambulatory Visit (HOSPITAL_COMMUNITY): Payer: Self-pay | Admitting: Cardiology

## 2020-03-12 ENCOUNTER — Encounter: Payer: Self-pay | Admitting: Family Medicine

## 2020-03-12 LAB — PROTIME-INR: INR: 2.3 — AB (ref 0.9–1.1)

## 2020-03-23 ENCOUNTER — Encounter: Payer: Self-pay | Admitting: Family Medicine

## 2020-03-23 LAB — PROTIME-INR: INR: 2.7 — AB (ref 0.9–1.1)

## 2020-03-29 ENCOUNTER — Ambulatory Visit
Admission: RE | Admit: 2020-03-29 | Discharge: 2020-03-29 | Disposition: A | Discharge status: Home / Self Care | Payer: Medicare Other | Source: Ambulatory Visit | Attending: Cardiothoracic Surgery | Admitting: Cardiothoracic Surgery

## 2020-03-29 ENCOUNTER — Ambulatory Visit (INDEPENDENT_AMBULATORY_CARE_PROVIDER_SITE_OTHER): Payer: Medicare Other | Admitting: Cardiothoracic Surgery

## 2020-03-29 ENCOUNTER — Other Ambulatory Visit: Payer: Self-pay

## 2020-03-29 VITALS — BP 104/72 | HR 65 | Temp 97.9°F | Resp 20 | Ht 66.0 in | Wt 350.0 lb

## 2020-03-29 DIAGNOSIS — Q231 Congenital insufficiency of aortic valve: Secondary | ICD-10-CM

## 2020-03-29 DIAGNOSIS — J9811 Atelectasis: Secondary | ICD-10-CM | POA: Diagnosis not present

## 2020-03-29 DIAGNOSIS — I2510 Atherosclerotic heart disease of native coronary artery without angina pectoris: Secondary | ICD-10-CM | POA: Diagnosis not present

## 2020-03-29 DIAGNOSIS — I712 Thoracic aortic aneurysm, without rupture, unspecified: Secondary | ICD-10-CM

## 2020-03-29 DIAGNOSIS — I700 Atherosclerosis of aorta: Secondary | ICD-10-CM | POA: Diagnosis not present

## 2020-03-29 NOTE — Progress Notes (Signed)
64 year old gentleman is [name] once again in thoracic surgery clinic for evaluation of ascending aortic aneurysm.  He has a history of bicuspid aortic valve disease and is status post aortic valve replacement several years ago with a mechanical valve.  His comorbidities include heart failure with reduced ejection fraction and morbid obesity.  He denies chest pain or back pain.  His blood pressure is well controlled.  He has been successful in stopping smoking.  Past medical history: As above Chronic warfarin therapy for mechanical valve Sleep apnea Morbid obesity Hypercholesterolemia  Medications: Reviewed  Physical exam well-appearing gentleman no acute distress HEENT: Normocephalic atraumatic Chest bilateral wheezing at bases Heart regular rate and rhythm Extremities 2+ edema pretibially  Imaging: I personally reviewed the chest CT performed today which demonstrates a stable 5.7 cm ascending aortic aneurysm at maximal diameter.  Impression: 64 year old gentleman with relatively stable ascending aortic aneurysm.  Right now his risk for elective surgery outweighs potential benefit of prospectively fixing the aneurysm due to his multiple comorbidities  Plan: Follow-up in 1 year with repeat CT scan Report to local emergency department for severe chest or back pain suggestive of a of aneurysm rupture or dissection  Roberto [name] Z. Roberto [name], Roberto [name]

## 2020-03-30 ENCOUNTER — Encounter: Payer: Self-pay | Admitting: Family Medicine

## 2020-03-30 LAB — PROTIME-INR: INR: 2.3 — AB (ref 0.9–1.1)

## 2020-04-07 ENCOUNTER — Telehealth: Payer: Self-pay

## 2020-04-07 ENCOUNTER — Ambulatory Visit: Payer: Self-pay | Admitting: Licensed Clinical Social Worker

## 2020-04-07 NOTE — Chronic Care Management (AMB) (Signed)
  Care Management   Follow Up Note   04/07/2020 Name: Roberto [name] MRN: [id number] DOB: Jan 03, 1956  Referred by: Valerie Roys, DO Reason for referral : Care Coordination   [name] is a 64 y.o. year old male who is a primary care patient of Valerie Roys, DO. The care management team was consulted for assistance with care management and care coordination needs.    Review of patient status, including review of consultants reports, relevant laboratory and other test results, and collaboration with appropriate care team members and the patient's provider was performed as part of comprehensive patient evaluation and provision of chronic care management services.    LCSW completed CCM outreach attempt today but was unable to reach patient successfully. A HIPPA compliant voice message was left encouraging patient to return call once available. LCSW has made over multiple unsuccessful outreach attempts and will close referral at this time due to difficulty maintaining contact with patient/caregiver. A new CCM referral can be placed by PCP if patient wishes to gain our services.   We have been unable to make contact with the patient for follow up. The care management team is available to follow up with the patient after provider conversation with the patient regarding recommendation for care management engagement and subsequent re-referral to the care management team.   [name], BSW, MSW, [email or web address] Phone: [phone]

## 2020-04-09 NOTE — Progress Notes (Deleted)
Follow-up Outpatient Visit Date: 04/12/2020  Primary Care Provider: Valerie Roys, DO [address] [place] [address]  Chief Complaint: ***  HPI:  Roberto [name] is a 64 y.o. male with history of mechanical aortic valve replacement,non-obstructive coronary artery disease, thoracic aortic aneurysmstroke, right renal infarct, chronic systolic heart failure (LVEF 40-45%), hyperlipidemia,and morbid obesity, who presents for follow-up of valvular heart disease, cardiomyopathy, and thoracic aortic aneurysm.  He was last seen in our office in mid July by Laurann Montana, NP.  He was feeling well at the time but had gained 6 pounds due to poor diet and sedentary lifestyle.  No medication changes were made at that time.  He saw Dr. Orvan Seen (cardiac surgery) last month for follow-up of his thoracic aortic aneurysm, which was stable in size at 5.7 cm.  It was felt that risks of elective surgery outweighed benefits; continued medical therapy with repeat CT in 1 year was recommended.  --------------------------------------------------------------------------------------------------  Past Medical History:  Diagnosis Date  . Bicuspid aortic valve    a. s/p #27 Carbomedics mechanical valve on 03/25/2010; b. on Coumadin; c. TTE 12/17: EF 40-45%, moderately dilated LV with moderate LVH, AVR well-seated with 14 mmHg gradient, peak AV velocity 2.5 m/s, mild mitral valve thickening with mild MR, mildly dilated RV with mildly reduced contraction  . Cellulitis   . Chronic systolic CHF (congestive heart failure) ([place])    a. R/LHC 03/2010 showed no significant CAD, LVEDP 31 mmHg, mean AoV gradient 34 mmHg at rest and 47 mmHg with dobutamine 20 mcg/kg/min, AVA 1.0 cm^2, RA 31, RV 68/25, PA 68/47, PCWP 38. PA sat 65%. CO 6.2 L/min (Fick) and 5.3 L/min (thermodilution)  . Clotting disorder ([place])   . H/O mechanical aortic valve replacement 03/25/2010   a. #27 Carbomedics mechanical valve  . Hypercholesterolemia   .  Renal infarct [facility]) 2017   Multiple right renal infarcts, likely embolic.  . Stroke ([place])   . TIA (transient ischemic attack) 05/2014   Past Surgical History:  Procedure Laterality Date  . AORTIC VALVE REPLACEMENT    . CARDIAC CATHETERIZATION  03/21/2010   No significant CAD. Severe aortic stenosis. Severely elevated left and right heart filling pressures.  Marland Kitchen CARDIAC SURGERY  2009   CHF  . CARPAL TUNNEL RELEASE Left 2005  . RIGHT HEART CATH AND CORONARY ANGIOGRAPHY N/A 01/28/2019   Procedure: RIGHT HEART CATH AND CORONARY ANGIOGRAPHY;  Surgeon: Nelva Bush, MD;  Location: [place] [facility];  Service: Cardiovascular;  Laterality: N/A;  . TONSILLECTOMY  1962    No outpatient medications have been marked as taking for the 04/12/20 encounter (Appointment) with Yobana Culliton, Harrell Gave, MD.    Allergies: Patient has no known allergies.  Social History   Tobacco Use  . Smoking status: Former Smoker    Packs/day: 0.50    Years: 46.00    Pack years: 23.00    Types: Cigarettes    Start date: 07/16/2017    Quit date: 05/11/2019    Years since quitting: 0.9  . Smokeless tobacco: Never Used  Vaping Use  . Vaping Use: Never used  Substance Use Topics  . Alcohol use: No    Alcohol/week: 0.0 standard drinks  . Drug use: No    Family History  Problem Relation Age of Onset  . Arthritis Mother   . Dementia Mother   . Colon cancer Mother   . Arthritis Father   . Diabetes Father   . Stroke Father   . Colon cancer  Father   . Heart attack Brother   . Breast cancer Sister   . Seizures Sister   . Cancer Brother        brain  . Heart disease Brother   . Heart attack Brother     Review of Systems: A 12-system review of systems was performed and was negative except as noted in the HPI.  --------------------------------------------------------------------------------------------------  Physical Exam: There were no vitals taken for this visit.  General:  *** HEENT: No  conjunctival pallor or scleral icterus. Facemask in place. Neck: Supple without lymphadenopathy, thyromegaly, JVD, or HJR. Lungs: Normal work of breathing. Clear to auscultation bilaterally without wheezes or crackles. Heart: Regular rate and rhythm without murmurs, rubs, or gallops. Non-displaced PMI. Abd: Bowel sounds present. Soft, NT/ND without hepatosplenomegaly Ext: No lower extremity edema. Radial, PT, and DP pulses are 2+ bilaterally. Skin: Warm and dry without rash.  EKG:  ***  Lab Results  Component Value Date   WBC 8.4 10/17/2019   HGB 13.3 10/17/2019   HCT 40.2 10/17/2019   MCV 87 10/17/2019   PLT 259 10/17/2019    Lab Results  Component Value Date   NA 141 10/17/2019   K 4.4 10/17/2019   CL 102 10/17/2019   CO2 24 10/17/2019   BUN 19 10/17/2019   CREATININE 1.20 10/17/2019   GLUCOSE 132 (H) 10/17/2019   ALT 16 10/17/2019    Lab Results  Component Value Date   CHOL 146 10/17/2019   HDL 42 10/17/2019   LDLCALC 44 10/17/2019   LDLDIRECT 96.4 06/04/2019   TRIG 412 (H) 10/17/2019   CHOLHDL 4.3 08/08/2019    --------------------------------------------------------------------------------------------------  ASSESSMENT AND PLAN: Harrell Gave Estellar Cadena, MD 04/09/2020 3:21 PM

## 2020-04-10 ENCOUNTER — Encounter: Payer: Self-pay | Admitting: Family Medicine

## 2020-04-10 DIAGNOSIS — Z952 Presence of prosthetic heart valve: Secondary | ICD-10-CM | POA: Diagnosis not present

## 2020-04-10 DIAGNOSIS — Z7901 Long term (current) use of anticoagulants: Secondary | ICD-10-CM | POA: Diagnosis not present

## 2020-04-10 LAB — PROTIME-INR: INR: 2.8 — AB (ref 0.9–1.1)

## 2020-04-12 ENCOUNTER — Ambulatory Visit: Payer: Medicare Other | Admitting: Internal Medicine

## 2020-04-14 ENCOUNTER — Ambulatory Visit: Payer: Medicare Other | Admitting: Internal Medicine

## 2020-04-15 ENCOUNTER — Encounter: Payer: Self-pay | Admitting: Family Medicine

## 2020-04-15 LAB — PROTIME-INR: INR: 2.9 — AB (ref 0.9–1.1)

## 2020-04-20 ENCOUNTER — Ambulatory Visit: Payer: Medicare Other | Admitting: Family Medicine

## 2020-04-22 ENCOUNTER — Telehealth: Payer: Self-pay | Admitting: Internal Medicine

## 2020-04-22 NOTE — Telephone Encounter (Signed)
Spoke with Roberto [name], ok per DPR. She would like to see if our office could begin checking patient's INR. Currently, patient's PCP, Dr. Park Liter, is monitoring the INR. Patient checks the INR at home using a machine provided by [facility] agency.  They feel our office will provide great care and that is why they want to switch. Advised I was not sure of our protocol and if INR would be able to continue be checked at home. Patient prefers to remain checking at home. Roberto [name] is aware our Coumadin Nurse is in the office on Monday and Wednesday, so they may not receive a call before next week. Patient also has appointment with Dr End on Thursday, 04/29/20 where they know it can be discussed in further detail with Dr End.  She was appreciative. Routing to [facility] and Dr End.

## 2020-04-22 NOTE — Telephone Encounter (Signed)
[name] calling  Would like to discuss getting patient on an INR treatment plan Please call to discuss

## 2020-04-22 NOTE — Telephone Encounter (Signed)
We can discuss next week to see if we can accommodate remote INR monitoring through our office.  Thanks.  Gerald Stabs

## 2020-04-26 ENCOUNTER — Encounter: Payer: Self-pay | Admitting: Family Medicine

## 2020-04-26 NOTE — Telephone Encounter (Signed)
I will be happy to dose his INR from home. Usually insurance requires that folks w/ machines at home check their INR either weekly or every 2 weeks. If he can have his resulting company fax the results to our office on Mondays or Wednesdays, I can dose him over the phone or send his instructions via [facility]. Will defer this discussion to his appt w/ Dr. Saunders Revel, as pt declined appt w/ me earlier this year.

## 2020-04-29 ENCOUNTER — Ambulatory Visit: Payer: Medicare Other | Admitting: Internal Medicine

## 2020-05-03 ENCOUNTER — Encounter: Payer: Self-pay | Admitting: Family Medicine

## 2020-05-03 LAB — PROTIME-INR: INR: 2.5 — AB (ref 0.9–1.1)

## 2020-05-08 ENCOUNTER — Telehealth: Payer: Self-pay | Admitting: Internal Medicine

## 2020-05-09 ENCOUNTER — Other Ambulatory Visit: Payer: Self-pay | Admitting: Family Medicine

## 2020-05-09 NOTE — Telephone Encounter (Signed)
Requested Prescriptions  Pending Prescriptions Disp Refills  . VENTOLIN HFA 108 (90 Base) MCG/ACT inhaler [Pharmacy Med Name: Ventolin HFA 108 (90 Base) MCG/ACT Inhalation Aerosol Solution] 54 g 1    Sig: INHALE 2 PUFFS INTO LUNGS EVERY 6 HOURS AS NEEDED FOR WHEEZING FOR SHORTNESS OF BREATH     Pulmonology:  Beta Agonists Failed - 05/09/2020  4:10 PM      Failed - One inhaler should last at least one month. If the patient is requesting refills earlier, contact the patient to check for uncontrolled symptoms.      Passed - Valid encounter within last 12 months    Recent Outpatient Visits          6 months ago Essential hypertension   [place], [name], DO   7 months ago Seasonal allergic rhinitis due to pollen   [name], [name], DO   9 months ago Upper respiratory tract infection, unspecified type   [facility], [name], DO   10 months ago COPD exacerbation [facility])   [place], [name], DO   11 months ago Diabetes mellitus type 2, diet-controlled [facility])   [place], Lilia Argue, [place]      Future Appointments            In 2 months [facility] [place]

## 2020-05-10 DIAGNOSIS — Z7901 Long term (current) use of anticoagulants: Secondary | ICD-10-CM | POA: Diagnosis not present

## 2020-05-10 DIAGNOSIS — Z952 Presence of prosthetic heart valve: Secondary | ICD-10-CM | POA: Diagnosis not present

## 2020-05-10 LAB — PROTIME-INR: INR: 3 — AB (ref 0.9–1.1)

## 2020-05-10 NOTE — Telephone Encounter (Signed)
As he has been taking Lasix 40mg  BID, okay to refill with that sig. Also okay to refill Spironolactone 25mg  QD.  He is overdue for his 3 month follow up. Recommend only filling 30 day supply so we can get him back in clinic and re-evaluate volume status. Multiple cancellations with issues with transportation. I have CC'd [place] from our SW team to see if they have ideas of resources that may assist with getting him to his appointments when we reschedule.  Loel Dubonnet, NP

## 2020-05-10 NOTE — Telephone Encounter (Signed)
Refills sent. Also, routing to scheduling to reach out to patient for appointment.

## 2020-05-10 NOTE — Telephone Encounter (Signed)
[name],  Do you mind just taking a look at this to make sure its ok to order like that?

## 2020-05-10 NOTE — Telephone Encounter (Signed)
[name] calling back to verify patients medication. - Okay per DPR She stated he takes   Lasix 40MG  - take one tablet by mouth TWICE a day Spironolactone 25MG  - take one tablet daily.   Please advise if this is how medication should be refilled.

## 2020-05-10 NOTE — Telephone Encounter (Signed)
Also need to very how he is taking his lasix.  Pharmacy is requesting 1 tablet daily. Chart states 1.5 tablets daily.

## 2020-05-10 NOTE — Telephone Encounter (Signed)
Called Patient to verify if he was taking 1/2 tab or whole tab of spironolactone. No answer. LMVO.

## 2020-05-11 ENCOUNTER — Telehealth: Payer: Self-pay

## 2020-05-11 NOTE — Telephone Encounter (Signed)
PA denied.

## 2020-05-11 NOTE — Telephone Encounter (Signed)
PA for Albuterol initiated and submitted via Cover My Meds. Key: [id number]

## 2020-05-12 ENCOUNTER — Ambulatory Visit (INDEPENDENT_AMBULATORY_CARE_PROVIDER_SITE_OTHER): Payer: Medicare Other | Admitting: Internal Medicine

## 2020-05-12 ENCOUNTER — Other Ambulatory Visit: Payer: Self-pay

## 2020-05-12 ENCOUNTER — Encounter: Payer: Self-pay | Admitting: Internal Medicine

## 2020-05-12 VITALS — BP 110/70 | HR 90 | Ht 66.0 in | Wt 357.0 lb

## 2020-05-12 DIAGNOSIS — E782 Mixed hyperlipidemia: Secondary | ICD-10-CM

## 2020-05-12 DIAGNOSIS — Z79899 Other long term (current) drug therapy: Secondary | ICD-10-CM | POA: Diagnosis not present

## 2020-05-12 DIAGNOSIS — I2510 Atherosclerotic heart disease of native coronary artery without angina pectoris: Secondary | ICD-10-CM

## 2020-05-12 DIAGNOSIS — I712 Thoracic aortic aneurysm, without rupture, unspecified: Secondary | ICD-10-CM

## 2020-05-12 DIAGNOSIS — Z952 Presence of prosthetic heart valve: Secondary | ICD-10-CM

## 2020-05-12 DIAGNOSIS — I5022 Chronic systolic (congestive) heart failure: Secondary | ICD-10-CM

## 2020-05-12 NOTE — Progress Notes (Signed)
Follow-up Outpatient Visit Date: 05/12/2020  Primary Care Provider: Valerie Roys, DO [place] [address]  Chief Complaint: Follow-up valvular heart disease, thoracic aortic aneurysm, and heart failure  HPI:  Roberto [name] is a 64 y.o. male with history of mechanical aortic valve replacement,non-obstructive coronary artery disease, thoracic aortic aneurysmstroke, right renal infarct, chronic systolic heart failure (LVEF 40-45%), hyperlipidemia,and morbid obesity, who presents for follow-up of valvular heart disease, cardiomyopathy, and thoracic aortic aneurysm.  He was last seen in our office in mid July by Laurann Montana, NP.  He was feeling well at the time but had gained 6 pounds due to poor diet and sedentary lifestyle.  No medication changes were made at that time.  He saw Dr. Orvan Seen (cardiac surgery) last month for follow-up of his thoracic aortic aneurysm, which was stable in size at 5.7 cm.  It was felt that risks of elective surgery outweighed benefits; continued medical therapy with repeat CT in 1 year was recommended.  Today, [name] feels a little bit depressed.  He has not been out of the house much.  His weight is up, which he attributes to inactivity.  He has stable exertional dyspnea.  He denies chest pain, palpitations, lightheadedness, and edema.  He is using CPAP nightly and denies orthopnea/PND.  He had previously inquired about transitioning his anticoagulation management to our office but now states that he wishes to have Dr. Wynetta Emery continue to assist with his home INR monitoring and warfarin management.  --------------------------------------------------------------------------------------------------  Past Medical History:  Diagnosis Date  . Bicuspid aortic valve    a. s/p #27 Carbomedics mechanical valve on 03/25/2010; b. on Coumadin; c. TTE 12/17: EF 40-45%, moderately dilated LV with moderate LVH, AVR well-seated with 14 mmHg gradient, peak AV  velocity 2.5 m/s, mild mitral valve thickening with mild MR, mildly dilated RV with mildly reduced contraction  . Cellulitis   . Chronic systolic CHF (congestive heart failure) ([place])    a. R/LHC 03/2010 showed no significant CAD, LVEDP 31 mmHg, mean AoV gradient 34 mmHg at rest and 47 mmHg with dobutamine 20 mcg/kg/min, AVA 1.0 cm^2, RA 31, RV 68/25, PA 68/47, PCWP 38. PA sat 65%. CO 6.2 L/min ([place]) and 5.3 L/min (thermodilution)  . Clotting disorder ([place])   . H/O mechanical aortic valve replacement 03/25/2010   a. #27 Carbomedics mechanical valve  . Hypercholesterolemia   . Renal infarct [facility]) 2017   Multiple right renal infarcts, likely embolic.  . Stroke ([place])   . TIA (transient ischemic attack) 05/2014   Past Surgical History:  Procedure Laterality Date  . AORTIC VALVE REPLACEMENT    . CARDIAC CATHETERIZATION  03/21/2010   No significant CAD. Severe aortic stenosis. Severely elevated left and right heart filling pressures.  Marland Kitchen CARDIAC SURGERY  2009   CHF  . CARPAL TUNNEL RELEASE Left 2005  . RIGHT HEART CATH AND CORONARY ANGIOGRAPHY N/A 01/28/2019   Procedure: RIGHT HEART CATH AND CORONARY ANGIOGRAPHY;  Surgeon: Nelva Bush, MD;  Location: [place] [facility];  Service: Cardiovascular;  Laterality: N/A;  . TONSILLECTOMY  1962    Current Meds  Medication Sig  . ADVAIR DISKUS 250-50 MCG/DOSE AEPB INHALE 1 DOSE BY MOUTH TWICE DAILY  . albuterol (PROVENTIL) (2.5 MG/3ML) 0.083% nebulizer solution Take 3 mLs (2.5 mg total) by nebulization every 6 (six) hours as needed for wheezing or shortness of breath.  . allopurinol (ZYLOPRIM) 100 MG tablet Take 1 tablet by mouth once daily  . aspirin  81 MG chewable tablet Chew 81 mg by mouth daily.    Marland Kitchen buPROPion (WELLBUTRIN XL) 150 MG 24 hr tablet Take 1 tablet (150 mg total) by mouth daily for 3 days, THEN 1 tablet (150 mg total) 2 (two) times daily.  . cetirizine (ZYRTEC) 10 MG tablet Take 1 tablet (10 mg total) by mouth daily.  .  cyclobenzaprine (FLEXERIL) 10 MG tablet Take 1 tablet (10 mg total) by mouth 3 times/day as needed-between meals & bedtime for muscle spasms.  . fenofibrate (TRICOR) 48 MG tablet Take 1 tablet (48 mg total) by mouth daily.  . furosemide (LASIX) 40 MG tablet Take 1 tablet (40 mg total) by mouth in the morning and at bedtime. Please call office to schedule appointment for further refills [phone]. Thanks!  Marland Kitchen icosapent Ethyl (VASCEPA) 1 g capsule Take 2 capsules (2 g total) by mouth 2 (two) times daily.  Marland Kitchen lisinopril (ZESTRIL) 5 MG tablet Take 1 tablet (5 mg total) by mouth daily.  . metoprolol succinate (TOPROL-XL) 100 MG 24 hr tablet TAKE 1 TABLET BY MOUTH ONCE DAILY WITH  OR  IMMEDIATELY  FOLLOWING  A  MEAL  . nortriptyline (PAMELOR) 10 MG capsule Take 1 capsule by mouth at bedtime  . Omega-3 Fatty Acids (FISH OIL) 1000 MG CPDR Take 2,000 mg by mouth daily.  . potassium chloride SA (KLOR-CON) 20 MEQ tablet Take 1 tablet (20 mEq total) by mouth daily.  . rosuvastatin (CRESTOR) 40 MG tablet Take 1 tablet (40 mg total) by mouth daily.  Marland Kitchen SPIRIVA HANDIHALER 18 MCG inhalation capsule Inhale 1 puff by mouth once daily  . spironolactone (ALDACTONE) 25 MG tablet Take 1 tablet (25 mg total) by mouth daily. Please call office to schedule appointment for further refills [phone]. Thanks!  Marland Kitchen VENTOLIN HFA 108 (90 Base) MCG/ACT inhaler INHALE 2 PUFFS INTO LUNGS EVERY 6 HOURS AS NEEDED FOR WHEEZING FOR SHORTNESS OF BREATH  . warfarin (COUMADIN) 4 MG tablet TAKE 1 TABLET BY MOUTH ONCE DAILY AT  6PM  AS  DIRECTED  . warfarin (COUMADIN) 7.5 MG tablet Take 1 tablet (7.5 mg total) by mouth daily. 4 mg for 2 days, 7.5 mg on the 3rd day, and repeat    Allergies: Patient has no known allergies.  Social History   Tobacco Use  . Smoking status: Former Smoker    Packs/day: 0.50    Years: 46.00    Pack years: 23.00    Types: Cigarettes    Start date: 07/16/2017    Quit date: 05/11/2019    Years since  quitting: 1.0  . Smokeless tobacco: Never Used  Vaping Use  . Vaping Use: Never used  Substance Use Topics  . Alcohol use: No    Alcohol/week: 0.0 standard drinks  . Drug use: No    Family History  Problem Relation Age of Onset  . Arthritis Mother   . Dementia Mother   . Colon cancer Mother   . Arthritis Father   . Diabetes Father   . Stroke Father   . Colon cancer Father   . Heart attack Brother   . Breast cancer Sister   . Seizures Sister   . Cancer Brother        brain  . Heart disease Brother   . Heart attack Brother     Review of Systems: A 12-system review of systems was performed and was negative except as noted in the HPI.  --------------------------------------------------------------------------------------------------  Physical Exam: BP 110/70 (BP Location: Left Arm,  Patient Position: Sitting, Cuff Size: Large)   Pulse 90   Ht 5\' 6"  (1.676 m)   Wt (!) 357 lb (161.9 kg)   SpO2 95%   BMI 57.62 kg/m   General: NAD. Neck: No gross JVD or HJR, though body habitus limits evaluation.  Lungs: Mildly diminished breath sounds throughout without wheezes or crackles. Heart: Regular rate and rhythm with mechanical S2.  No murmurs. Abdomen: Obese but soft.  Nontender. Extremities: No lower extremity edema.   Lab Results  Component Value Date   WBC 8.4 10/17/2019   HGB 13.3 10/17/2019   HCT 40.2 10/17/2019   MCV 87 10/17/2019   PLT 259 10/17/2019    Lab Results  Component Value Date   NA 141 10/17/2019   K 4.4 10/17/2019   CL 102 10/17/2019   CO2 24 10/17/2019   BUN 19 10/17/2019   CREATININE 1.20 10/17/2019   GLUCOSE 132 (H) 10/17/2019   ALT 16 10/17/2019    Lab Results  Component Value Date   CHOL 146 10/17/2019   HDL 42 10/17/2019   LDLCALC 44 10/17/2019   LDLDIRECT 96.4 06/04/2019   TRIG 412 (H) 10/17/2019   CHOLHDL 4.3 08/08/2019     --------------------------------------------------------------------------------------------------  ASSESSMENT AND PLAN: Chronic HFrEF: Mr. Merri Ray appears grossly euvolemic, though his morbid obesity makes volume assessment challenging.  His weight is up 3 pounds since his last visit with [place] in July.  He reports stable NYHA class III symptoms.  Blood pressure is low normal today and has been even lower in the past.  We discussed escalation of goal-directed medical therapy, though Mr. Levon Hedger wishes to defer this today.  We will therefore continue current doses of metoprolol succinate, lisinopril, and spironolactone.  Furosemide 40 mg twice daily will also be continued.  I will check a basic metabolic panel today to ensure appropriate renal function and electrolytes.  Continue follow-up with heart failure clinic as well.  Thoracic aortic aneurysm: Asymptomatic.  Continue routine follow-up with Dr. Julien Girt.  Coronary artery disease: Moderate single-vessel CAD involving a large first diagonal branch noted by catheterization last year.  No symptoms to suggest worsening coronary insufficiency reported.  Continue current medications to prevent progression of disease.  Status post aortic valve replacement: Exam normal today.  Continue anticoagulation with warfarin as well as aspirin therapy.  Hyperlipidemia: LDL well controlled on last check.  Triglycerides still elevated.  Importance of dietary modification as well as weight loss discussed.  Continue current doses of rosuvastatin and fenofibrate.  Morbid obesity: BMI 58 with multiple comorbidities.  Weight loss through diet and exercise again encouraged.  Nelva Bush, MD 05/12/2020 1:19 PM

## 2020-05-12 NOTE — Patient Instructions (Signed)
Medication Instructions:  Your physician recommends that you continue on your current medications as directed. Please refer to the Current Medication list given to you today.  *If you need a refill on your cardiac medications before your next appointment, please call your pharmacy*   Lab Work: Your physician recommends that you return for lab work in: [place].  If you have labs (blood work) drawn today and your tests are completely normal, you will receive your results only by: Marland Kitchen MyChart Message (if you have MyChart) OR . A paper copy in the mail If you have any lab test that is abnormal or we need to change your treatment, we will call you to review the results.   Testing/Procedures: None  Follow-Up: At [facility], you and your health needs are our priority.  As part of our continuing mission to provide you with exceptional heart care, we have created designated Provider Care Teams.  These Care Teams include your primary Cardiologist (physician) and Advanced Practice Providers (APPs -  Physician Assistants and Nurse Practitioners) who all work together to provide you with the care you need, when you need it.  We recommend signing up for the patient portal called "MyChart".  Sign up information is provided on this After Visit Summary.  MyChart is used to connect with patients for Virtual Visits (Telemedicine).  Patients are able to view lab/test results, encounter notes, upcoming appointments, etc.  Non-urgent messages can be sent to your provider as well.   To learn more about what you can do with MyChart, go to [email or web address].    Your next appointment:   3 month(s)  The format for your next appointment:   In Person  Provider:   You may see Nelva Bush, MD or one of the following Advanced Practice Providers on your designated Care Team:    Murray Hodgkins, NP  Christell Faith, [name]  Marrianne Mood, [name]  Cadence Warfield, [place]

## 2020-05-13 LAB — BASIC METABOLIC PANEL
BUN/Creatinine Ratio: 16 (ref 10–24)
BUN: 22 mg/dL (ref 8–27)
CO2: 22 mmol/L (ref 20–29)
Calcium: 9.9 mg/dL (ref 8.6–10.2)
Chloride: 101 mmol/L (ref 96–106)
Creatinine, Ser: 1.34 mg/dL — ABNORMAL HIGH (ref 0.76–1.27)
GFR calc Af Amer: 64 mL/min/{1.73_m2} (ref >59)
GFR calc non Af Amer: 55 mL/min/{1.73_m2} — ABNORMAL LOW (ref >59)
Glucose: 122 mg/dL — ABNORMAL HIGH (ref 65–99)
Potassium: 4.7 mmol/L (ref 3.5–5.2)
Sodium: 139 mmol/L (ref 134–144)

## 2020-05-14 ENCOUNTER — Encounter: Payer: Self-pay | Admitting: Internal Medicine

## 2020-05-14 ENCOUNTER — Telehealth: Payer: Self-pay | Admitting: Licensed Clinical Social Worker

## 2020-05-14 NOTE — Telephone Encounter (Signed)
CSW received call back from pt.  Pt reports that his roommate normally drives him to appts and is great about adjusting her schedule to accommodate him but that her car broke down they day of his appt which prevented him from coming.  States the car is now back up and running and has no concerns for future transport.  CSW encouraged pt to save my number in case he has unexpected trouble in the future.  Jorge Ny, LCSW Clinical Social Worker [facility] Desk#: [phone] Cell#: [phone]

## 2020-05-14 NOTE — Telephone Encounter (Signed)
Thank you! So appreciate you reaching out.   Loel Dubonnet, NP

## 2020-05-14 NOTE — Telephone Encounter (Signed)
CSW received consult to reach out to patient regarding transportation concerns.    CSW attempted to call pt to discuss but went straight to VM- left message requesting return call.   Will continue to follow and assist as needed  Jorge Ny, [facility] Desk#: [phone] Cell#: [phone]

## 2020-05-24 ENCOUNTER — Other Ambulatory Visit: Payer: Self-pay | Admitting: Family Medicine

## 2020-05-24 ENCOUNTER — Other Ambulatory Visit (HOSPITAL_COMMUNITY): Payer: Self-pay | Admitting: Cardiology

## 2020-05-24 DIAGNOSIS — Z23 Encounter for immunization: Secondary | ICD-10-CM | POA: Diagnosis not present

## 2020-05-24 NOTE — Telephone Encounter (Signed)
Requested medication (s) are due for refill today: yes  Requested medication (s) are on the active medication list: yes  Last refill:  03/11/20  #90  0 refills  Future visit scheduled:yes  Notes to clinic: Cr elevated. Please review    Requested Prescriptions  Pending Prescriptions Disp Refills   allopurinol (ZYLOPRIM) 100 MG tablet [Pharmacy Med Name: Allopurinol 100 MG Oral Tablet] 90 tablet 0    Sig: Take 1 tablet by mouth once daily      Endocrinology:  Gout Agents Failed - 05/24/2020 11:59 AM      Failed - Cr in normal range and within 360 days    Creatinine  Date Value Ref Range Status  05/25/2014 0.85 0.60 - 1.30 mg/dL Final   Creatinine, Ser  Date Value Ref Range Status  05/12/2020 1.34 (H) 0.76 - 1.27 mg/dL Final          Passed - Uric Acid in normal range and within 360 days    Uric Acid  Date Value Ref Range Status  10/17/2019 8.1 3.8 - 8.4 mg/dL Final    Comment:               Therapeutic target for gout patients: <6.0          Passed - Valid encounter within last 12 months    Recent Outpatient Visits           7 months ago Essential hypertension   [place], [name], DO   7 months ago Seasonal allergic rhinitis due to pollen   [facility], [name], DO   9 months ago Upper respiratory tract infection, unspecified type   [facility], [name], DO   11 months ago COPD exacerbation [facility])   [place], [name], DO   11 months ago Diabetes mellitus type 2, diet-controlled ([place])   [place], Lilia Argue, [place]       Future Appointments             In 1 month  [place], [place]   In 3 months End, Christopher, MD [place], [facility]             Signed Prescriptions Disp Refills   SPIRIVA HANDIHALER 18 MCG inhalation capsule 90 capsule 0    Sig: Inhale 1 puff by mouth once daily      Pulmonology:   Anticholinergic Agents Passed - 05/24/2020 11:59 AM      Passed - Valid encounter within last 12 months    Recent Outpatient Visits           7 months ago Essential hypertension   [place], [name], DO   7 months ago Seasonal allergic rhinitis due to pollen   [facility], [name], DO   9 months ago Upper respiratory tract infection, unspecified type   [facility], [name], DO   11 months ago COPD exacerbation [facility])   [place], [name], DO   11 months ago Diabetes mellitus type 2, diet-controlled ([place])   [place], Lilia Argue, [place]       Future Appointments             In 1 month  [place], [place]   In 3 months End, Harrell Gave, MD [facility], [place]  ADVAIR DISKUS 250-50 MCG/DOSE AEPB 180 each 0    Sig: INHALE 1 DOSE BY MOUTH TWICE DAILY      Pulmonology:  Combination Products Passed - 05/24/2020 11:59 AM      Passed - Valid encounter within last 12 months    Recent Outpatient Visits           7 months ago Essential hypertension   [place], [name], DO   7 months ago Seasonal allergic rhinitis due to pollen   [facility], [name], DO   9 months ago Upper respiratory tract infection, unspecified type   [facility], [name], DO   11 months ago COPD exacerbation [facility])   [place], [name], DO   11 months ago Diabetes mellitus type 2, diet-controlled [facility])   [place], Lilia Argue, [place]       Future Appointments             In 1 month  [place], [place]   In 3 months End, Harrell Gave, MD [facility], [place]

## 2020-05-24 NOTE — Telephone Encounter (Signed)
Requested Prescriptions  Pending Prescriptions Disp Refills   SPIRIVA HANDIHALER 18 MCG inhalation capsule [Pharmacy Med Name: Spiriva HandiHaler 18 MCG Inhalation Capsule] 90 capsule 0    Sig: Inhale 1 puff by mouth once daily     Pulmonology:  Anticholinergic Agents Passed - 05/24/2020 11:59 AM      Passed - Valid encounter within last 12 months    Recent Outpatient Visits          7 months ago Essential hypertension   [place], [name], DO   7 months ago Seasonal allergic rhinitis due to pollen   [facility], [name], DO   9 months ago Upper respiratory tract infection, unspecified type   [facility], [name], DO   11 months ago COPD exacerbation [facility])   [place], [name], DO   11 months ago Diabetes mellitus type 2, diet-controlled ([place])   [place], Lilia Argue, [place]      Future Appointments            In 1 month  [place], [place]   In 3 months End, Harrell Gave, MD [place], [place] 250-50 MCG/DOSE [place] Najjar Med Name: Advair Diskus 250-50 MCG/DOSE Inhalation Aerosol Powder Breath Activated] 180 each 0    Sig: INHALE [place] DAILY     Pulmonology:  Combination Products Passed - 05/24/2020 11:59 AM      Passed - Valid encounter within last 12 months    Recent Outpatient Visits          7 months ago Essential hypertension   [place], [name], DO   7 months ago Seasonal allergic rhinitis due to pollen   [facility], [name], DO   9 months ago Upper respiratory tract infection, unspecified type   [facility], [name], DO   11 months ago COPD exacerbation ([place])   [place], [name], DO   11 months ago Diabetes mellitus type 2, diet-controlled ([place])   [place], Lilia Argue, [place]      Future Appointments            In 1 month  [place], [place]   In 3 months End, Harrell Gave, MD [place], [facility]            allopurinol (ZYLOPRIM) 100 MG tablet [Pharmacy Med Name: Allopurinol 100 MG Oral Tablet] 90 tablet 0    Sig: Take 1 tablet by mouth once daily     Endocrinology:  Gout Agents Failed - 05/24/2020 11:59 AM      Failed - Cr in normal range and within 360 days    Creatinine  Date Value Ref Range Status  05/25/2014 0.85 0.60 - 1.30 mg/dL Final   Creatinine, Ser  Date Value Ref Range Status  05/12/2020 1.34 (H) 0.76 - 1.27 mg/dL Final         Passed - Uric Acid in normal range and within 360 days    Uric Acid  Date Value Ref Range Status  10/17/2019 8.1 3.8 - 8.4 mg/dL Final    Comment:               Therapeutic target for gout patients: <6.0         Passed - Valid encounter within last  12 months    Recent Outpatient Visits          7 months ago Essential hypertension   [place], [name], DO   7 months ago Seasonal allergic rhinitis due to pollen   [facility], [name], DO   9 months ago Upper respiratory tract infection, unspecified type   [facility], [name], DO   11 months ago COPD exacerbation [facility])   [place], [name], DO   11 months ago Diabetes mellitus type 2, diet-controlled [facility])   [place], Lilia Argue, [place]      Future Appointments            In 1 month  [place], [place]   In 3 months End, Harrell Gave, MD [facility], [place]

## 2020-05-25 LAB — PROTIME-INR: INR: 3 — AB (ref 0.9–1.1)

## 2020-05-27 ENCOUNTER — Encounter: Payer: Self-pay | Admitting: Family Medicine

## 2020-06-02 LAB — POCT INR: INR: 2.8 — AB (ref 0.9–1.1)

## 2020-06-07 ENCOUNTER — Ambulatory Visit: Payer: Medicare Other | Admitting: Internal Medicine

## 2020-06-14 ENCOUNTER — Encounter: Payer: Self-pay | Admitting: Family Medicine

## 2020-06-14 DIAGNOSIS — Z952 Presence of prosthetic heart valve: Secondary | ICD-10-CM | POA: Diagnosis not present

## 2020-06-14 DIAGNOSIS — Z7901 Long term (current) use of anticoagulants: Secondary | ICD-10-CM | POA: Diagnosis not present

## 2020-06-14 LAB — PROTIME-INR: INR: 3.4 — AB (ref 0.9–1.1)

## 2020-06-17 ENCOUNTER — Encounter: Payer: Self-pay | Admitting: Family Medicine

## 2020-06-17 LAB — PROTIME-INR: INR: 2.6 — AB (ref 0.9–1.1)

## 2020-06-22 ENCOUNTER — Other Ambulatory Visit: Payer: Self-pay | Admitting: Family

## 2020-06-22 ENCOUNTER — Other Ambulatory Visit: Payer: Self-pay | Admitting: Internal Medicine

## 2020-06-22 ENCOUNTER — Other Ambulatory Visit: Payer: Self-pay | Admitting: Family Medicine

## 2020-06-22 ENCOUNTER — Other Ambulatory Visit (HOSPITAL_COMMUNITY): Payer: Self-pay | Admitting: Cardiology

## 2020-06-22 NOTE — Telephone Encounter (Signed)
Courtesy refill. Future visit in 3 weeks

## 2020-06-22 NOTE — Telephone Encounter (Signed)
Requested medication (s) are due for refill today: yes  Requested medication (s) are on the active medication list: yes  Last refill:  03/04/20 #90 0 refills  Future visit scheduled: yes in 3 weeks   Notes to clinic:  not delegated per protocol     Requested Prescriptions  Pending Prescriptions Disp Refills   warfarin (COUMADIN) 4 MG tablet [Pharmacy Med Name: Warfarin Sodium 4 MG Oral Tablet] 90 tablet 0    Sig: TAKE 1 TABLET BY MOUTH ONCE DAILY AT  [place]      Hematology:  Anticoagulants - warfarin Failed - 06/22/2020  6:21 PM      Failed - This refill cannot be delegated      Failed - If the patient is managed by Coumadin Clinic - route to their Pool. If not, forward to the provider.      Failed - INR in normal range and within 30 days    INR  Date Value Ref Range Status  06/02/2020 2.8 (A) 0.9 - 1.1 Final  10/17/2019 2.7 (H) 0.9 - 1.1 Final  05/25/2014 1.9  Final    Comment:    INR reference interval applies to patients on anticoagulant therapy. A single INR therapeutic range for coumarins is not optimal for all indications; however, the suggested range for most indications is 2.0 - 3.0. Exceptions to the INR Reference Range may include: Prosthetic heart valves, acute myocardial infarction, prevention of myocardial infarction, and combinations of aspirin and anticoagulant. The need for a higher or lower target INR must be assessed individually. Reference: The Pharmacology and Management of the Vitamin K  antagonists: the seventh ACCP Conference on Antithrombotic and Thrombolytic Therapy. IEPPI.9518 Sept:126 (3suppl): [id number]. A HCT value >55% may artifactually increase the PT.  In one study,  the increase was an average of 25%. Reference:  "Effect on Routine and Special Coagulation Testing Values of Citrate Anticoagulant Adjustment in Patients with High HCT Values." American Journal of Clinical Pathology 2006;126:400-405.           Failed - Valid  encounter within last 3 months    Recent Outpatient Visits           8 months ago Essential hypertension   [place], [name], DO   8 months ago Seasonal allergic rhinitis due to pollen   [facility], [name], DO   10 months ago Upper respiratory tract infection, unspecified type   [facility], [name], DO   12 months ago COPD exacerbation  [facility])   [place], [name], DO   1 year ago Diabetes mellitus type 2, diet-controlled [facility])   [place], Lilia Argue, [place]       Future Appointments             In 3 weeks  [employer], [place]   In 2 months End, Harrell Gave, MD [facility]              Signed Prescriptions Disp Refills   nortriptyline (PAMELOR) 10 MG capsule 90 capsule 0    Sig: Take 1 capsule by mouth at bedtime      Psychiatry:  Antidepressants - Heterocyclics (TCAs) Failed - 06/22/2020  6:21 PM      Failed - Valid encounter within last 6 months    Recent Outpatient Visits           8 months ago Essential hypertension   [facility], [name], DO   8 months ago Seasonal allergic rhinitis due to pollen   [facility], [name], DO   10 months ago Upper respiratory tract infection, unspecified type   [facility], [place], DO   12 months ago COPD exacerbation [facility])   [place], [place] P, DO   1 year ago Diabetes mellitus type 2, diet-controlled [facility])   [place], Lilia Argue, [place]       Future Appointments             In 3 weeks  [facility], [place]   In 2 months End, Harrell Gave, MD [facility], [place]

## 2020-06-23 ENCOUNTER — Other Ambulatory Visit: Payer: Self-pay | Admitting: Family Medicine

## 2020-06-23 ENCOUNTER — Encounter: Payer: Self-pay | Admitting: Family Medicine

## 2020-06-23 LAB — PROTIME-INR: INR: 2.9 — AB (ref 0.9–1.1)

## 2020-06-25 ENCOUNTER — Encounter: Payer: Self-pay | Admitting: Family Medicine

## 2020-06-25 LAB — PROTIME-INR: INR: 2.5 — AB (ref 0.9–1.1)

## 2020-07-02 ENCOUNTER — Encounter: Payer: Self-pay | Admitting: Family Medicine

## 2020-07-02 LAB — PROTIME-INR: INR: 3 — AB (ref 0.9–1.1)

## 2020-07-05 ENCOUNTER — Telehealth: Payer: Self-pay | Admitting: Family Medicine

## 2020-07-05 NOTE — Telephone Encounter (Signed)
Has not been seen since April. Needs appt

## 2020-07-05 NOTE — Telephone Encounter (Signed)
Pt scheduled for apt on 07/07/2020 pt's Friend Roberto [name] (dpr) stated they can do vitals at home and have a smart phone stated pt did not like  To go out and due to gout in his feet flare its harder for him to get out. Can this be virtual or in office?

## 2020-07-05 NOTE — Telephone Encounter (Signed)
Ander Purpura, pts friend, called stating that the pt has a flare up of gout in his feet. She is requesting to have something sent in for pt to help. Please advise.      [facility] [address] ([address]), [place] - [address]  [address]) [place] [address]  Phone: [phone] Fax: [phone]  Hours: Not open 24 hours

## 2020-07-05 NOTE — Telephone Encounter (Signed)
I need blood work. Better if they can come in.

## 2020-07-05 NOTE — Telephone Encounter (Signed)
Please advise 

## 2020-07-06 NOTE — Telephone Encounter (Signed)
Lvm that has to be in office.

## 2020-07-07 ENCOUNTER — Ambulatory Visit: Payer: Medicare Other | Admitting: Family Medicine

## 2020-07-09 LAB — POCT INR: INR: 3 — AB (ref 0.9–1.1)

## 2020-07-12 ENCOUNTER — Telehealth: Payer: Self-pay | Admitting: Family Medicine

## 2020-07-12 NOTE — Telephone Encounter (Signed)
Copied from [place] [id number]. Topic: Medicare AWV >> Jul 12, 2020  1:40 PM Cher Nakai R wrote: Reason for CRM:  Left patient a message reference to the AWVS scheduled Jul 15, 2020 cancelled and rescheduled to be done by phone on Jul 16, 2020 at 9:45 am.

## 2020-07-13 ENCOUNTER — Other Ambulatory Visit: Payer: Self-pay | Admitting: Family Medicine

## 2020-07-13 NOTE — Telephone Encounter (Signed)
Requested medication (s) are due for refill today: expired   Requested medication (s) are on the active medication list: yes  Last refill:  06/24/2019 #150 1 refill   Future visit scheduled: yes in  days   Notes to clinic:  expired medication, do you want to renew Rx?     Requested Prescriptions  Pending Prescriptions Disp Refills   albuterol (PROVENTIL) (2.5 MG/3ML) 0.083% nebulizer solution [Pharmacy Med Name: Albuterol Sulfate (2.5 MG/3ML) 0.083% Inhalation Nebulization Solution] 150 mL 0    Sig: USE 1 VIAL IN NEBULIZER EVERY 6 HOURS AS NEEDED FOR WHEEZING OR  SHORTNESS  OF  BREATH      Pulmonology:  Beta Agonists Failed - 07/13/2020  4:40 PM      Failed - One inhaler should last at least one month. If the patient is requesting refills earlier, contact the patient to check for uncontrolled symptoms.      Passed - Valid encounter within last 12 months    Recent Outpatient Visits           9 months ago Essential hypertension   [place], [name], DO   9 months ago Seasonal allergic rhinitis due to pollen   [facility], [name], DO   11 months ago Upper respiratory tract infection, unspecified type   [place], [place], DO   1 year ago COPD exacerbation [facility])   [place], [name], DO   1 year ago Diabetes mellitus type 2, diet-controlled [facility])   [place], Lilia Argue, [place]       Future Appointments             In 3 days  [facility], [place]   In 1 month End, Harrell Gave, MD [facility], [place]

## 2020-07-15 ENCOUNTER — Ambulatory Visit: Payer: Medicare Other

## 2020-07-16 ENCOUNTER — Telehealth: Payer: Self-pay

## 2020-07-16 ENCOUNTER — Ambulatory Visit (INDEPENDENT_AMBULATORY_CARE_PROVIDER_SITE_OTHER): Payer: Medicare Other

## 2020-07-16 VITALS — Ht 66.0 in | Wt 340.0 lb

## 2020-07-16 DIAGNOSIS — Z0000 Encounter for general adult medical examination without abnormal findings: Secondary | ICD-10-CM

## 2020-07-16 NOTE — Telephone Encounter (Signed)
Patient consented to telehealth visit. 

## 2020-07-16 NOTE — Progress Notes (Signed)
I connected with Roberto [name] today by telephone and verified that I am speaking with the correct person using two identifiers. Location patient: home Location provider: work Persons participating in the virtual visit: Roberto [name], roommate Roberto Flowers LPN.   I discussed the limitations, risks, security and privacy concerns of performing an evaluation and management service by telephone and the availability of in person appointments. I also discussed with the patient that there may be a patient responsible charge related to this service. The patient expressed understanding and verbally consented to this telephonic visit.    Interactive audio and video telecommunications were attempted between this provider and patient, however failed, due to patient having technical difficulties OR patient did not have access to video capability.  We continued and completed visit with audio only.     Vital signs may be patient reported or missing  Subjective:   Roberto [name] is a 65 y.o. male who presents for Medicare Annual/Subsequent preventive examination.  Review of Systems     Cardiac Risk Factors include: advanced age (>43men, >71 women);dyslipidemia;hypertension;male gender;obesity (BMI >30kg/m2);sedentary lifestyle     Objective:    Today's Vitals   07/16/20 0942  Weight: (!) 340 lb (154.2 kg)  Height: 5\' 6"  (1.676 m)   Body mass index is 54.88 kg/m.  Advanced Directives 07/16/2020 09/01/2019 07/14/2019 01/28/2019 01/25/2019 11/04/2018 07/11/2018  Does Patient Have a Medical Advance Directive? No No No No No No No  Does patient want to make changes to medical advance directive? - - - - - - -  Would patient like information on creating a medical advance directive? - No - Patient declined - No - Patient declined - No - Patient declined Yes (MAU/Ambulatory/Procedural Areas - Information given)    Current Medications (verified) Outpatient Encounter Medications as of 07/16/2020   Medication Sig  . ADVAIR DISKUS 250-50 MCG/DOSE AEPB INHALE 1 DOSE BY MOUTH TWICE DAILY  . albuterol (PROVENTIL) (2.5 MG/3ML) 0.083% nebulizer solution USE 1 VIAL IN NEBULIZER EVERY 6 HOURS AS NEEDED FOR WHEEZING OR  SHORTNESS  OF  BREATH  . allopurinol (ZYLOPRIM) 100 MG tablet Take 1 tablet by mouth once daily  . aspirin 81 MG chewable tablet Chew 81 mg by mouth daily.  Marland Kitchen buPROPion (WELLBUTRIN XL) 150 MG 24 hr tablet Take 1 tablet (150 mg total) by mouth 2 (two) times daily. Needs appt for future refills  . cetirizine (ZYRTEC) 10 MG tablet Take 1 tablet (10 mg total) by mouth daily.  . cyclobenzaprine (FLEXERIL) 10 MG tablet Take 1 tablet (10 mg total) by mouth 3 times/day as needed-between meals & bedtime for muscle spasms.  . fenofibrate (TRICOR) 48 MG tablet Take 1 tablet (48 mg total) by mouth daily.  . furosemide (LASIX) 40 MG tablet Take 1 tablet (40 mg total) by mouth in the morning and at bedtime. Please call office to schedule appointment for further refills [phone]. Thanks!  Marland Kitchen icosapent Ethyl (VASCEPA) 1 g capsule Take 2 capsules (2 g total) by mouth 2 (two) times daily.  Marland Kitchen lisinopril (ZESTRIL) 5 MG tablet Take 1 tablet (5 mg total) by mouth daily.  . metoprolol succinate (TOPROL-XL) 100 MG 24 hr tablet TAKE 1 TABLET BY MOUTH ONCE DAILY WITH OR IMMEDIATELY FOLLOWING A MEAL  . nortriptyline (PAMELOR) 10 MG capsule Take 1 capsule by mouth at bedtime  . Omega-3 Fatty Acids (FISH OIL) 1000 MG CPDR Take 2,000 mg by mouth daily.  . potassium chloride SA (KLOR-CON) 20 MEQ tablet Take 1 tablet (  20 mEq total) by mouth daily.  . rosuvastatin (CRESTOR) 40 MG tablet Take 1 tablet by mouth once daily  . SPIRIVA HANDIHALER 18 MCG inhalation capsule Inhale 1 puff by mouth once daily  . spironolactone (ALDACTONE) 25 MG tablet Take 1 tablet (25 mg total) by mouth daily. Please call office to schedule appointment for further refills [phone]. Thanks!  Marland Kitchen VENTOLIN HFA 108 (90 Base) MCG/ACT  inhaler INHALE 2 PUFFS INTO LUNGS EVERY 6 HOURS AS NEEDED FOR WHEEZING FOR SHORTNESS OF BREATH  . warfarin (COUMADIN) 4 MG tablet TAKE 1 TABLET BY MOUTH ONCE DAILY AT  6PM  AS  DIRECTED  . warfarin (COUMADIN) 7.5 MG tablet Take 1 tablet (7.5 mg total) by mouth daily. 4 mg for 2 days, 7.5 mg on the 3rd day, and repeat   No facility-administered encounter medications on file as of 07/16/2020.    Allergies (verified) Patient has no known allergies.   History: Past Medical History:  Diagnosis Date  . Bicuspid aortic valve    a. s/p #27 Carbomedics mechanical valve on 03/25/2010; b. on Coumadin; c. TTE 12/17: EF 40-45%, moderately dilated LV with moderate LVH, AVR well-seated with 14 mmHg gradient, peak AV velocity 2.5 m/s, mild mitral valve thickening with mild MR, mildly dilated RV with mildly reduced contraction  . Cellulitis   . Chronic systolic CHF (congestive heart failure) ([place])    a. R/LHC 03/2010 showed no significant CAD, LVEDP 31 mmHg, mean AoV gradient 34 mmHg at rest and 47 mmHg with dobutamine 20 mcg/kg/min, AVA 1.0 cm^2, RA 31, RV 68/25, PA 68/47, PCWP 38. PA sat 65%. CO 6.2 L/min ([place]) and 5.3 L/min (thermodilution)  . Clotting disorder ([place])   . H/O mechanical aortic valve replacement 03/25/2010   a. #27 Carbomedics mechanical valve  . Hypercholesterolemia   . Renal infarct [facility]) 2017   Multiple right renal infarcts, likely embolic.  . Stroke ([place])   . TIA (transient ischemic attack) 05/2014   Past Surgical History:  Procedure Laterality Date  . AORTIC VALVE REPLACEMENT    . CARDIAC CATHETERIZATION  03/21/2010   No significant CAD. Severe aortic stenosis. Severely elevated left and right heart filling pressures.  Marland Kitchen CARDIAC SURGERY  2009   CHF  . CARPAL TUNNEL RELEASE Left 2005  . RIGHT HEART CATH AND CORONARY ANGIOGRAPHY N/A 01/28/2019   Procedure: RIGHT HEART CATH AND CORONARY ANGIOGRAPHY;  Surgeon: Nelva Bush, MD;  Location: [facility];  Service:  Cardiovascular;  Laterality: N/A;  . TONSILLECTOMY  1962   Family History  Problem Relation Age of Onset  . Arthritis Mother   . Dementia Mother   . Colon cancer Mother   . Arthritis Father   . Diabetes Father   . Stroke Father   . Colon cancer Father   . Heart attack Brother   . Breast cancer Sister   . Seizures Sister   . Cancer Brother        brain  . Heart disease Brother   . Heart attack Brother    Social History   Socioeconomic History  . Marital status: Single    Spouse name: Not on file  . Number of children: 1  . Years of education: 4  . Highest education level: Associate degree: academic program  Occupational History  . Occupation: Disabled    Employer: UNEMPLOYED  Tobacco Use  . Smoking status: Former Smoker    Packs/day: 0.50    Years: 46.00    Pack years: 23.00  Types: Cigarettes    Start date: 07/16/2017    Quit date: 05/11/2019    Years since quitting: 1.1  . Smokeless tobacco: Never Used  Vaping Use  . Vaping Use: Never used  Substance and Sexual Activity  . Alcohol use: No    Alcohol/week: 0.0 standard drinks  . Drug use: No  . Sexual activity: Not Currently  Other Topics Concern  . Not on file  Social History Narrative  . Not on file   Social Determinants of Health   Financial Resource Strain: Low Risk   . Difficulty of Paying Living Expenses: Not hard at all  Food Insecurity: No Food Insecurity  . Worried About Charity fundraiser in the Last Year: Never true  . Ran Out of Food in the Last Year: Never true  Transportation Needs: No Transportation Needs  . Lack of Transportation (Medical): No  . Lack of Transportation (Non-Medical): No  Physical Activity: Inactive  . Days of Exercise per Week: 0 days  . Minutes of Exercise per Session: 0 min  Stress: No Stress Concern Present  . Feeling of Stress : Not at all  Social Connections: Not on file    Tobacco Counseling Counseling given: Not Answered   Clinical Intake:  Pre-visit  preparation completed: Yes  Pain : No/denies pain     Nutritional Status: BMI > 30  Obese Nutritional Risks: None Diabetes: No  How often do you need to have someone help you when you read instructions, pamphlets, or other written materials from your doctor or pharmacy?: 1 - Never What is the last grade level you completed in school?: 15.5 yrs  Diabetic? Yes Nutrition Risk Assessment:  Has the patient had any N/V/D within the last 2 months?  No  Does the patient have any non-healing wounds?  No  Has the patient had any unintentional weight loss or weight gain?  No   Diabetes:  Is the patient diabetic?  Yes  If diabetic, was a CBG obtained today?  No  Did the patient bring in their glucometer from home?  No  How often do you monitor your CBG's? never.   Financial Strains and Diabetes Management:  Are you having any financial strains with the device, your supplies or your medication? No .  Does the patient want to be seen by Chronic Care Management for management of their diabetes?  No  Would the patient like to be referred to a Nutritionist or for Diabetic Management?  No   Diabetic Exams:  Diabetic Eye Exam: Overdue for diabetic eye exam. Pt has been advised about the importance in completing this exam. Patient advised to call and schedule an eye exam. Diabetic Foot Exam: Completed 10/17/2019   Interpreter Needed?: No  Information entered by :: NAllen LPN   Activities of Daily Living In your present state of health, do you have any difficulty performing the following activities: 07/16/2020  Hearing? Y  Vision? N  Difficulty concentrating or making decisions? Y  Walking or climbing stairs? Y  Dressing or bathing? N  Doing errands, shopping? N  Preparing Food and eating ? N  Using the Toilet? N  In the past six months, have you accidently leaked urine? Y  Do you have problems with loss of bowel control? N  Managing your Medications? Y  Comment roommate helps   Managing your Finances? N  Housekeeping or managing your Housekeeping? N  Some recent data might be hidden    Patient Care Team: Park Liter  P, DO as PCP - General (Family Medicine) End, Harrell Gave, MD as PCP - Cardiology (Cardiology) Sueanne Margarita, MD as PCP - Sleep Medicine (Cardiology)  Indicate any recent Medical Services you may have received from other than [facility] providers in the past year (date may be approximate).     Assessment:   This is a routine wellness examination for Corie.  Hearing/Vision screen No exam data present  Dietary issues and exercise activities discussed: Current Exercise Habits: The patient does not participate in regular exercise at present  Goals    .  DIET - INCREASE WATER INTAKE      Recommend drinking at least 6-8 glasses of water a day     .  Exercise 3x per week (30 min per time)      Recommend starting a routine exercise program at least 3 days a week for 30-45 minutes at a time as tolerated.      .  Patient Stated      07/16/2020, wants to weigh 170 pounds    .  PharmD "We are having a hard time affording his medications" (pt-stated)      Current Barriers:  . Polypharmacy; complex patient with multiple comorbidities including HFrEF, aortic stenosis, atherosclerosis, T2DM, COPD d/t tobacco abuse, gout . Caregiver, [name], manages his medications . Financial barriers - patient has applied for Medicaid, but has been denied and [name] notes that hearing didn't go well. Collaboratively applied for Medicare Extra Help on 04/10/2019.  o HFrEF (40-45%); followed by Dr. Saunders Revel, seen in [facility] by Aundra Dubin o - furoesmide 60 mg BID, spironolactone 25 mg daily, lisinopril 5 mg daily, metoprolol succinate 100 mg daily; potassium 20 mEq daily  - Lisinopril last filled 01/03/2019 for 90 day supply o ASCVD risk reduction: rosuvastatin 40 mg daily - just increased d/t LDL of 93, TG >500; goal LDL <70. Vascepa 2 g BID also added; received call from [name] that  [name] is too expensive on his insurance; ASA 81 mg daily - Noted to cardiology that he stopped smoking ~10 days ago o Aortic aneurysm; following w/ CT surgery to schedule repair o S/p mechanical AVR; warfarin daily to maintain INR 2-3 o COPD/tobacco abuse: patient noted he quit smoking ~10 days ago; continues on Advair 250/50 BID + Spiriva 81 mcg daily, albuterol HFA PRN; bupropion XL 150 mg daily for tobacco cessation - Receiving Spiriva through [employer] patient assistance through end of 2020. Will need to start the process to reapply for 2021 o Chronic pain: nortriptyline 10 mg QPM; cyclobenzaprine 10 mg daily  o Gout: allopurinol 100 mg daily   Pharmacist Clinical Goal(s):  Marland Kitchen Over the next 90 days, patient will work with PharmD and provider towards optimized medication management  Interventions: . Comprehensive medication review performed; medication list updated in electronic medical record . Contacted [name] to discuss her concerns w/ Vascepa cost. Left HIPAA compliant message for her to return my call at her convenience. There is not patient assistance for Vascepa, however, could discuss application to [employer] for hyperlipidemia.   Patient Self Care Activities:  . Patient will take medications as prescribed  Initial goal documentation     .  SW-'We need to get [place] approved for Medicaid" (pt-stated)      Current Barriers:  . Financial constraints related to managing health care expenses . Limited social support . Level of care concerns . ADL IADL limitations . Social Isolation . Limited access to caregiver  Clinical Social Work Clinical Goal(s):  .  Over the next 120 days, patient will work with SW to address concerns related to gaining Adult Medicaid for patient in order to alleviate financial strain  Interventions: . Patient interviewed and appropriate assessments performed . Provided patient with information about Medicaid enrollment and strict enrollment  guidelines  . Discussed plans with patient for ongoing care management follow up and provided patient with direct contact information for care management team . Advised patient to work on gathering all of pt's medical bills in order to get him approved for Medicaid. Patient has been trying to apply for Medicaid since February of [id number] and his application was lost. Patient completed an additional application in August of 2020 but was denied services because patient did not reach the Medical Bill amount needed to get approved for Medicaid. Friend reports that several of these medical bills were lost when his first Medicaid application went missing in February of this year.  Marland Kitchen LCSW left voice message with Medicaid casework [name] at [phone].  Marland Kitchen LCSW updated CCM Pharmacist and provided this contact information as well ^. . Assisted patient/caregiver with obtaining information about health plan benefits . Provided education and assistance to client regarding Advanced Directives. . Provided education to patient/caregiver regarding level of care options.  Patient Self Care Activities:  . Attends all scheduled provider appointments . Unable to independently afford medical expenses and was declined Medicaid  Initial goal documentation       Depression Screen PHQ 2/9 Scores 07/16/2020 09/01/2019 07/14/2019 07/11/2018 07/06/2017 05/17/2017 01/25/2017  PHQ - 2 Score 3 1 1 1 2  0 1  PHQ- 9 Score 3 - - - 2 - 2    Fall Risk Fall Risk  07/16/2020 09/01/2019 07/14/2019 10/22/2018 07/11/2018  Falls in the past year? 0 0 0 0 0  Number falls in past yr: - - 0 0 0  Injury with Fall? - - 0 0 -  Risk for fall due to : Medication side effect - - - -  Follow up Falls evaluation completed;Education provided;Falls prevention discussed - - - -    FALL RISK PREVENTION PERTAINING TO THE HOME:  Any stairs in or around the home? Yes  If so, are there any without handrails? No  Home free of loose throw rugs in walkways,  pet beds, electrical cords, etc? Yes  Adequate lighting in your home to reduce risk of falls? Yes   ASSISTIVE DEVICES UTILIZED TO PREVENT FALLS:  Life alert? No  Use of a cane, walker or w/c? No  Grab bars in the bathroom? No  Shower chair or bench in shower? No  Elevated toilet seat or a handicapped toilet? No   TIMED UP AND GO:  Was the test performed? No . .  Cognitive Function:     6CIT Screen 07/16/2020 07/11/2018 07/06/2017  What Year? 0 points 0 points 0 points  What month? 0 points 0 points 0 points  What time? 0 points 0 points 0 points  Count back from 20 0 points 0 points 0 points  Months in reverse 0 points 0 points 0 points  Repeat phrase 8 points 0 points 0 points  Total Score 8 0 0    Immunizations Immunization History  Administered Date(s) Administered  . Influenza,inj,Quad PF,6+ Mos 04/19/2016, 05/17/2017, 04/03/2018  . Pneumococcal Polysaccharide-23 06/09/2014  . Tdap 06/09/2014    TDAP status: Up to date  Flu Vaccine status: Declined, Education has been provided regarding the importance of this vaccine but patient still declined. Advised may  receive this vaccine at local pharmacy or Health Dept. Aware to provide a copy of the vaccination record if obtained from local pharmacy or Health Dept. Verbalized acceptance and understanding.  Pneumococcal vaccine status: Declined,  Education has been provided regarding the importance of this vaccine but patient still declined. Advised may receive this vaccine at local pharmacy or Health Dept. Aware to provide a copy of the vaccination record if obtained from local pharmacy or Health Dept. Verbalized acceptance and understanding.   Covid-19 vaccine status: needs second dose  Qualifies for Shingles Vaccine? Yes   Zostavax completed No   Shingrix Completed?: No.    Education has been provided regarding the importance of this vaccine. Patient has been advised to call insurance company to determine out of pocket expense  if they have not yet received this vaccine. Advised may also receive vaccine at local pharmacy or Health Dept. Verbalized acceptance and understanding.  Screening Tests Health Maintenance  Topic Date Due  . COVID-19 Vaccine (1) Never done  . COLONOSCOPY (Pts 45-41yrs Insurance coverage will need to be confirmed)  Never done  . OPHTHALMOLOGY EXAM  06/01/2017  . PNA vac Low Risk Adult (1 of 2 - PCV13) 08/26/2019  . INFLUENZA VACCINE  02/01/2020  . HEMOGLOBIN A1C  04/17/2020  . FOOT EXAM  10/16/2020  . TETANUS/TDAP  06/09/2024  . Hepatitis C Screening  Completed  . HIV Screening  Completed    Health Maintenance  Health Maintenance Due  Topic Date Due  . COVID-19 Vaccine (1) Never done  . COLONOSCOPY (Pts 45-42yrs Insurance coverage will need to be confirmed)  Never done  . OPHTHALMOLOGY EXAM  06/01/2017  . PNA vac Low Risk Adult (1 of 2 - PCV13) 08/26/2019  . INFLUENZA VACCINE  02/01/2020  . HEMOGLOBIN A1C  04/17/2020    Colorectal cancer screening: decline  Lung Cancer Screening: (Low Dose CT Chest recommended if Age 26-80 years, 30 pack-year currently smoking OR have quit w/in 15years.) does not qualify.   Lung Cancer Screening Referral: no  Additional Screening:  Hepatitis C Screening: does qualify; Completed 02/09/2015  Vision Screening: Recommended annual ophthalmology exams for early detection of glaucoma and other disorders of the eye. Is the patient up to date with their annual eye exam?  No  Who is the provider or what is the name of the office in which the patient attends annual eye exams? none If pt is not established with a provider, would they like to be referred to a provider to establish care? No .   Dental Screening: Recommended annual dental exams for proper oral hygiene  Community Resource Referral / Chronic Care Management: CRR required this visit?  No   CCM required this visit?  No      Plan:     I have personally reviewed and noted the following  in the patient's chart:   . Medical and social history . Use of alcohol, tobacco or illicit drugs  . Current medications and supplements . Functional ability and status . Nutritional status . Physical activity . Advanced directives . List of other physicians . Hospitalizations, surgeries, and ER visits in previous 12 months . Vitals . Screenings to include cognitive, depression, and falls . Referrals and appointments  In addition, I have reviewed and discussed with patient certain preventive protocols, quality metrics, and best practice recommendations. A written personalized care plan for preventive services as well as general preventive health recommendations were provided to patient.     Kellie Simmering,  LPN   [id number]   Nurse Notes:

## 2020-07-16 NOTE — Patient Instructions (Signed)
[name] , Thank you for taking time to come for your Medicare Wellness Visit. I appreciate your ongoing commitment to your health goals. Please review the following plan we discussed and let me know if I can assist you in the future.   Screening recommendations/referrals: Colonoscopy: decline Recommended yearly ophthalmology/optometry visit for glaucoma screening and checkup Recommended yearly dental visit for hygiene and checkup  Vaccinations: Influenza vaccine: decline Pneumococcal vaccine: decline Tdap vaccine: completed 06/09/2014 Shingles vaccine: discussed   Covid-19: needs second dose  Advanced directives: Advance directive discussed with you today.    Conditions/risks identified: none  Next appointment: Follow up in one year for your annual wellness visit.   Preventive Care 66 Years and Older, Male Preventive care refers to lifestyle choices and visits with your health care provider that can promote health and wellness. What does preventive care include?  A yearly physical exam. This is also called an annual well check.  Dental exams once or twice a year.  Routine eye exams. Ask your health care provider how often you should have your eyes checked.  Personal lifestyle choices, including:  Daily care of your teeth and gums.  Regular physical activity.  Eating a healthy diet.  Avoiding tobacco and drug use.  Limiting alcohol use.  Practicing safe sex.  Taking low doses of aspirin every day.  Taking vitamin and mineral supplements as recommended by your health care provider. What happens during an annual well check? The services and screenings done by your health care provider during your annual well check will depend on your age, overall health, lifestyle risk factors, and family history of disease. Counseling  Your health care provider may ask you questions about your:  Alcohol use.  Tobacco use.  Drug use.  Emotional well-being.  Home and  relationship well-being.  Sexual activity.  Eating habits.  History of falls.  Memory and ability to understand (cognition).  Work and work Statistician. Screening  You may have the following tests or measurements:  Height, weight, and BMI.  Blood pressure.  Lipid and cholesterol levels. These may be checked every 5 years, or more frequently if you are over 50 years old.  Skin check.  Lung cancer screening. You may have this screening every year starting at age 10 if you have a 30-pack-year history of smoking and currently smoke or have quit within the past 15 years.  Fecal occult blood test (FOBT) of the stool. You may have this test every year starting at age 8.  Flexible sigmoidoscopy or colonoscopy. You may have a sigmoidoscopy every 5 years or a colonoscopy every 10 years starting at age 13.  Prostate cancer screening. Recommendations will vary depending on your family history and other risks.  Hepatitis C blood test.  Hepatitis B blood test.  Sexually transmitted disease (STD) testing.  Diabetes screening. This is done by checking your blood sugar (glucose) after you have not eaten for a while (fasting). You may have this done every 1-3 years.  Abdominal aortic aneurysm (AAA) screening. You may need this if you are a current or former smoker.  Osteoporosis. You may be screened starting at age 8 if you are at high risk. Talk with your health care provider about your test results, treatment options, and if necessary, the need for more tests. Vaccines  Your health care provider may recommend certain vaccines, such as:  Influenza vaccine. This is recommended every year.  Tetanus, diphtheria, and acellular pertussis (Tdap, Td) vaccine. You may need a Td  booster every 10 years.  Zoster vaccine. You may need this after age 53.  Pneumococcal 13-valent conjugate (PCV13) vaccine. One dose is recommended after age 27.  Pneumococcal polysaccharide (PPSV23) vaccine. One  dose is recommended after age 21. Talk to your health care provider about which screenings and vaccines you need and how often you need them. This information is not intended to replace advice given to you by your health care provider. Make sure you discuss any questions you have with your health care provider. Document Released: 07/16/2015 Document Revised: 03/08/2016 Document Reviewed: 04/20/2015 Elsevier Interactive Patient Education  2017 [place] Prevention in the Home Falls can cause injuries. They can happen to people of all ages. There are many things you can do to make your home safe and to help prevent falls. What can I do on the outside of my home?  Regularly fix the edges of walkways and driveways and fix any cracks.  Remove anything that might make you trip as you walk through a door, such as a raised step or threshold.  Trim any bushes or trees on the path to your home.  Use bright outdoor lighting.  Clear any walking paths of anything that might make someone trip, such as rocks or tools.  Regularly check to see if handrails are loose or broken. Make sure that both sides of any steps have handrails.  Any raised decks and porches should have guardrails on the edges.  Have any leaves, snow, or ice cleared regularly.  Use sand or salt on walking paths during winter.  Clean up any spills in your garage right away. This includes oil or grease spills. What can I do in the bathroom?  Use night lights.  Install grab bars by the toilet and in the tub and shower. Do not use towel bars as grab bars.  Use non-skid mats or decals in the tub or shower.  If you need to sit down in the shower, use a plastic, non-slip stool.  Keep the floor dry. Clean up any water that spills on the floor as soon as it happens.  Remove soap buildup in the tub or shower regularly.  Attach bath mats securely with double-sided non-slip rug tape.  Do not have throw rugs and other  things on the floor that can make you trip. What can I do in the bedroom?  Use night lights.  Make sure that you have a light by your bed that is easy to reach.  Do not use any sheets or blankets that are too big for your bed. They should not hang down onto the floor.  Have a firm chair that has side arms. You can use this for support while you get dressed.  Do not have throw rugs and other things on the floor that can make you trip. What can I do in the kitchen?  Clean up any spills right away.  Avoid walking on wet floors.  Keep items that you use a lot in easy-to-reach places.  If you need to reach something above you, use a strong step stool that has a grab bar.  Keep electrical cords out of the way.  Do not use floor polish or wax that makes floors slippery. If you must use wax, use non-skid floor wax.  Do not have throw rugs and other things on the floor that can make you trip. What can I do with my stairs?  Do not leave any items on the stairs.  Make sure that there are handrails on both sides of the stairs and use them. Fix handrails that are broken or loose. Make sure that handrails are as long as the stairways.  Check any carpeting to make sure that it is firmly attached to the stairs. Fix any carpet that is loose or worn.  Avoid having throw rugs at the top or bottom of the stairs. If you do have throw rugs, attach them to the floor with carpet tape.  Make sure that you have a light switch at the top of the stairs and the bottom of the stairs. If you do not have them, ask someone to add them for you. What else can I do to help prevent falls?  Wear shoes that:  Do not have high heels.  Have rubber bottoms.  Are comfortable and fit you well.  Are closed at the toe. Do not wear sandals.  If you use a stepladder:  Make sure that it is fully opened. Do not climb a closed stepladder.  Make sure that both sides of the stepladder are locked into place.  Ask  someone to hold it for you, if possible.  Clearly mark and make sure that you can see:  Any grab bars or handrails.  First and last steps.  Where the edge of each step is.  Use tools that help you move around (mobility aids) if they are needed. These include:  Canes.  Walkers.  Scooters.  Crutches.  Turn on the lights when you go into a dark area. Replace any light bulbs as soon as they burn out.  Set up your furniture so you have a clear path. Avoid moving your furniture around.  If any of your floors are uneven, fix them.  If there are any pets around you, be aware of where they are.  Review your medicines with your doctor. Some medicines can make you feel dizzy. This can increase your chance of falling. Ask your doctor what other things that you can do to help prevent falls. This information is not intended to replace advice given to you by your health care provider. Make sure you discuss any questions you have with your health care provider. Document Released: 04/15/2009 Document Revised: 11/25/2015 Document Reviewed: 07/24/2014 Elsevier Interactive Patient Education  2017 [employer].

## 2020-07-22 ENCOUNTER — Other Ambulatory Visit: Payer: Self-pay

## 2020-07-22 ENCOUNTER — Other Ambulatory Visit (HOSPITAL_COMMUNITY): Payer: Self-pay | Admitting: Cardiology

## 2020-07-22 ENCOUNTER — Other Ambulatory Visit: Payer: Self-pay | Admitting: Family Medicine

## 2020-07-22 MED ORDER — METOPROLOL SUCCINATE ER 100 MG PO TB24: ORAL_TABLET | ORAL | 0 refills | Status: DC

## 2020-07-22 MED ORDER — ROSUVASTATIN CALCIUM 40 MG PO TABS: 40.0000 mg | ORAL_TABLET | Freq: Every day | ORAL | 0 refills | Status: DC

## 2020-07-22 NOTE — Telephone Encounter (Signed)
Medication Refill - Medication: cetirizine (ZYRTEC) 10 MG tablet Medication Date: 10/03/2019 Department: [facility] Ordering/Authorizing: Valerie Roys, DO     Has the patient contacted their pharmacy? Y (Agent: If no, request that the patient contact the pharmacy for the refill.) (Agent: If yes, when and what did the pharmacy advise?) Call DR  Preferred Pharmacy (with phone number or street name): [facility] [place] Phone:  [phone]  Fax:  [phone]       Agent: Please be advised that RX refills may take up to 3 business days. We ask that you follow-up with your pharmacy.

## 2020-07-23 ENCOUNTER — Telehealth: Payer: Self-pay | Admitting: Family Medicine

## 2020-07-23 MED ORDER — ALBUTEROL SULFATE (2.5 MG/3ML) 0.083% IN NEBU: INHALATION_SOLUTION | RESPIRATORY_TRACT | 0 refills | Status: DC

## 2020-07-23 NOTE — Telephone Encounter (Signed)
Routing to provider to advise.  

## 2020-07-23 NOTE — Telephone Encounter (Signed)
Medication Refill - Medication: albuterol (PROVENTIL) (2.5 MG/3ML) 0.083% nebulizer solution  Courtney called from pharmacy explaining that pt needs a new Rx with an ICD10 because of his insurance. Please advise   Has the patient contacted their pharmacy? Yes.   (Agent: If no, request that the patient contact the pharmacy for the refill.) (Agent: If yes, when and what did the pharmacy advise?)  Preferred Pharmacy (with phone number or street name):  [facility] [place] ([place]) [place] [address]  Phone: [phone] Fax: [phone]     Agent: Please be advised that RX refills may take up to 3 business days. We ask that you follow-up with your pharmacy.

## 2020-07-24 ENCOUNTER — Encounter: Payer: Self-pay | Admitting: Family Medicine

## 2020-07-24 DIAGNOSIS — Z952 Presence of prosthetic heart valve: Secondary | ICD-10-CM | POA: Diagnosis not present

## 2020-07-24 DIAGNOSIS — Z7901 Long term (current) use of anticoagulants: Secondary | ICD-10-CM | POA: Diagnosis not present

## 2020-07-24 LAB — PROTIME-INR: INR: 2.3 — AB (ref 0.9–1.1)

## 2020-07-30 ENCOUNTER — Encounter: Payer: Self-pay | Admitting: Family Medicine

## 2020-07-30 LAB — PROTIME-INR: INR: 2.8 — AB (ref 0.9–1.1)

## 2020-08-17 ENCOUNTER — Other Ambulatory Visit: Payer: Self-pay | Admitting: Family Medicine

## 2020-08-17 ENCOUNTER — Other Ambulatory Visit: Payer: Self-pay | Admitting: Internal Medicine

## 2020-08-17 ENCOUNTER — Other Ambulatory Visit (HOSPITAL_COMMUNITY): Payer: Self-pay | Admitting: Cardiology

## 2020-08-17 ENCOUNTER — Encounter: Payer: Self-pay | Admitting: Family Medicine

## 2020-08-17 LAB — PROTIME-INR: INR: 2.4 — AB (ref 0.9–1.1)

## 2020-08-22 ENCOUNTER — Other Ambulatory Visit (HOSPITAL_COMMUNITY): Payer: Self-pay | Admitting: Cardiology

## 2020-08-23 ENCOUNTER — Telehealth: Payer: Self-pay | Admitting: Internal Medicine

## 2020-08-23 NOTE — Telephone Encounter (Signed)
Patient's caregiver, Vangie Bicker, would like a call to discuss "issues" that patient is having. Patient has an appointment on 2/24 and will not be able to attend.

## 2020-08-23 NOTE — Telephone Encounter (Signed)
Spoke to [employer], ok per DPR.  She will not be able to make it to patient's appointment this Thursday and had a few concerns to report. She is afraid patient will not remember to ask Dr End about these things: 1- Needs refills on Fenofibrate, metoprolol. She thinks there may be a few more but cannot remember.  2- Getting over a gout flare up over the past 3 weeks. Per [name], PCP would not give him any medication to help. She would like Dr End to look at his feet.   3- Having issue with grip in right hand. Right arm pain and can't hold anything.  They're trying to avoid his PCP right now and patient would like to get a new one.  Advised I will let Dr End know and we will see patient at appointment. She was appreciative.

## 2020-08-24 ENCOUNTER — Other Ambulatory Visit (HOSPITAL_COMMUNITY): Payer: Self-pay | Admitting: *Deleted

## 2020-08-24 ENCOUNTER — Other Ambulatory Visit: Payer: Self-pay | Admitting: Family Medicine

## 2020-08-24 NOTE — Telephone Encounter (Signed)
pts wife left vm requesting call back for refills. I called [phone] and no answer/left vm requesting return call.

## 2020-08-26 ENCOUNTER — Other Ambulatory Visit: Payer: Self-pay

## 2020-08-26 ENCOUNTER — Other Ambulatory Visit
Admission: RE | Admit: 2020-08-26 | Discharge: 2020-08-26 | Disposition: A | Discharge status: Home / Self Care | Payer: Medicare Other | Attending: Internal Medicine | Admitting: Internal Medicine

## 2020-08-26 ENCOUNTER — Encounter: Payer: Self-pay | Admitting: Internal Medicine

## 2020-08-26 ENCOUNTER — Ambulatory Visit (INDEPENDENT_AMBULATORY_CARE_PROVIDER_SITE_OTHER): Payer: Medicare Other | Admitting: Internal Medicine

## 2020-08-26 ENCOUNTER — Encounter: Payer: Self-pay | Admitting: Family Medicine

## 2020-08-26 VITALS — BP 110/72 | HR 76 | Ht 66.0 in | Wt 343.0 lb

## 2020-08-26 DIAGNOSIS — Z79899 Other long term (current) drug therapy: Secondary | ICD-10-CM

## 2020-08-26 DIAGNOSIS — Z7901 Long term (current) use of anticoagulants: Secondary | ICD-10-CM | POA: Diagnosis not present

## 2020-08-26 DIAGNOSIS — I10 Essential (primary) hypertension: Secondary | ICD-10-CM | POA: Diagnosis not present

## 2020-08-26 DIAGNOSIS — Z952 Presence of prosthetic heart valve: Secondary | ICD-10-CM | POA: Diagnosis not present

## 2020-08-26 DIAGNOSIS — I2510 Atherosclerotic heart disease of native coronary artery without angina pectoris: Secondary | ICD-10-CM | POA: Diagnosis not present

## 2020-08-26 DIAGNOSIS — E782 Mixed hyperlipidemia: Secondary | ICD-10-CM | POA: Diagnosis not present

## 2020-08-26 DIAGNOSIS — I712 Thoracic aortic aneurysm, without rupture, unspecified: Secondary | ICD-10-CM

## 2020-08-26 DIAGNOSIS — I428 Other cardiomyopathies: Secondary | ICD-10-CM

## 2020-08-26 DIAGNOSIS — I5022 Chronic systolic (congestive) heart failure: Secondary | ICD-10-CM

## 2020-08-26 LAB — BASIC METABOLIC PANEL
Anion gap: 9 (ref 5–15)
BUN: 28 mg/dL — ABNORMAL HIGH (ref 8–23)
CO2: 22 mmol/L (ref 22–32)
Calcium: 9 mg/dL (ref 8.9–10.3)
Chloride: 105 mmol/L (ref 98–111)
Creatinine, Ser: 1.5 mg/dL — ABNORMAL HIGH (ref 0.61–1.24)
GFR, Estimated: 51 mL/min — ABNORMAL LOW (ref >60)
Glucose, Bld: 129 mg/dL — ABNORMAL HIGH (ref 70–99)
Potassium: 4.3 mmol/L (ref 3.5–5.1)
Sodium: 136 mmol/L (ref 135–145)

## 2020-08-26 LAB — PROTIME-INR: INR: 2.8 — AB (ref 0.9–1.1)

## 2020-08-26 MED ORDER — BUPROPION HCL ER (XL) 150 MG PO TB24: 150.0000 mg | ORAL_TABLET | Freq: Every day | ORAL | 0 refills | Status: DC

## 2020-08-26 MED ORDER — LISINOPRIL 5 MG PO TABS: 5.0000 mg | ORAL_TABLET | Freq: Two times a day (BID) | ORAL | 1 refills | Status: DC

## 2020-08-26 NOTE — Patient Instructions (Signed)
Medication Instructions:  Your physician has recommended you make the following change in your medication:  1- TAPER OFF Wellbutrin by taking 150 mg (1 tablet) by  Mouth once a day for 7 days, THEN STOP.  *If you need a refill on your cardiac medications before your next appointment, please call your pharmacy*  Lab Work: Your physician recommends that you return for lab work in: [place]. - Please go to the [facility] [place]. You will check in at the front desk to the right as you walk into the atrium. Valet Parking is offered if needed. - No appointment needed. You may go any day between 7 am and 6 pm.  If you have labs (blood work) drawn today and your tests are completely normal, you will receive your results only by: Marland Kitchen MyChart Message (if you have MyChart) OR . A paper copy in the mail If you have any lab test that is abnormal or we need to change your treatment, we will call you to review the results.  Testing/Procedures: none  Follow-Up: At [facility], you and your health needs are our priority.  As part of our continuing mission to provide you with exceptional heart care, we have created designated Provider Care Teams.  These Care Teams include your primary Cardiologist (physician) and Advanced Practice Providers (APPs -  Physician Assistants and Nurse Practitioners) who all work together to provide you with the care you need, when you need it.  We recommend signing up for the patient portal called "MyChart".  Sign up information is provided on this After Visit Summary.  MyChart is used to connect with patients for Virtual Visits (Telemedicine).  Patients are able to view lab/test results, encounter notes, upcoming appointments, etc.  Non-urgent messages can be sent to your provider as well.   To learn more about what you can do with MyChart, go to [email or web address].    Your next appointment:   1 month(s)  The format for your next appointment:   In  Person  Provider:   You may see Nelva Bush, MD or one of the following Advanced Practice Providers on your designated Care Team:    Murray Hodgkins, NP  Christell Faith, [name]  Marrianne Mood, [name]  Cadence [place], [place]  Laurann Montana, NP

## 2020-08-26 NOTE — Progress Notes (Signed)
Follow-up Outpatient Visit Date: 08/26/2020  Primary Care Provider: Valerie Roys, DO [place] [address]  Chief Complaint: Follow-up cardiomyopathy, valvular heart disease, and TAA  HPI:  Roberto [name] is a 65 y.o. male with history of mechanical aortic valve replacement,non-obstructive coronary artery disease, thoracic aortic aneurysm (followed by Dr. Cathe Mons, right renal infarct, chronic systolic heart failure (LVEF 40-45%), hyperlipidemia,and morbid obesity, who presents for follow-up of valvular heart disease, cardiomyopathy, thoracic aortic aneurysm.  I last saw him in 05/2020, at which time he was feeling a little depressed.  His weight continued to increase, which she attributed to inactivity.  Exertional dyspnea was stable.  We did not make any medication changes or pursue additional testing.  Today, Roberto [name] reports that he feels about the same as prior visits with stable exertional dyspnea.  He has not had any chest pain, palpitations, lightheadedness, or dizziness.  He recently had a gout flare that took several weeks to resolve.  He has lost some weight since our prior visit.  He happily reports that he has not smoked for almost a year and a half.  He wonders if he needs to stay on bupropion that was originally prescribed to help with tobacco cessation.  He continues with annual follow-up of his thoracic aortic aneurysm with Dr. Orvan Seen.  --------------------------------------------------------------------------------------------------  Past Medical History:  Diagnosis Date   Bicuspid aortic valve    a. s/p #27 Carbomedics mechanical valve on 03/25/2010; b. on Coumadin; c. TTE 12/17: EF 40-45%, moderately dilated LV with moderate LVH, AVR well-seated with 14 mmHg gradient, peak AV velocity 2.5 m/s, mild mitral valve thickening with mild MR, mildly dilated RV with mildly reduced contraction   Cellulitis    Chronic systolic CHF (congestive heart failure)  ([place])    a. R/LHC 03/2010 showed no significant CAD, LVEDP 31 mmHg, mean AoV gradient 34 mmHg at rest and 47 mmHg with dobutamine 20 mcg/kg/min, AVA 1.0 cm^2, RA 31, RV 68/25, PA 68/47, PCWP 38. PA sat 65%. CO 6.2 L/min ([place]) and 5.3 L/min (thermodilution)   Clotting disorder (HCC)    H/O mechanical aortic valve replacement 03/25/2010   a. #27 Carbomedics mechanical valve   Hypercholesterolemia    Renal infarct [facility]) 2017   Multiple right renal infarcts, likely embolic.   Stroke [facility])    TIA (transient ischemic attack) 05/2014   Past Surgical History:  Procedure Laterality Date   AORTIC VALVE REPLACEMENT     CARDIAC CATHETERIZATION  03/21/2010   No significant CAD. Severe aortic stenosis. Severely elevated left and right heart filling pressures.   CARDIAC SURGERY  2009   CHF   CARPAL TUNNEL RELEASE Left 2005   RIGHT HEART CATH AND CORONARY ANGIOGRAPHY N/A 01/28/2019   Procedure: RIGHT HEART CATH AND CORONARY ANGIOGRAPHY;  Surgeon: Nelva Bush, MD;  Location: [facility];  Service: Cardiovascular;  Laterality: N/A;   TONSILLECTOMY  1962    Current Meds  Medication Sig   ADVAIR DISKUS 250-50 MCG/DOSE AEPB INHALE 1 DOSE BY MOUTH TWICE DAILY   albuterol (PROVENTIL) (2.5 MG/3ML) 0.083% nebulizer solution USE 1 VIAL IN NEBULIZER EVERY 6 HOURS AS NEEDED FOR WHEEZING OR  SHORTNESS  OF  BREATH   allopurinol (ZYLOPRIM) 100 MG tablet Take 1 tablet by mouth once daily   aspirin 81 MG chewable tablet Chew 81 mg by mouth daily.   buPROPion (WELLBUTRIN XL) 150 MG 24 hr tablet Take 1 tablet by mouth twice daily   cetirizine (ZYRTEC) 10  MG tablet Take 1 tablet (10 mg total) by mouth daily.   cyclobenzaprine (FLEXERIL) 10 MG tablet Take 1 tablet (10 mg total) by mouth 3 times/day as needed-between meals & bedtime for muscle spasms.   fenofibrate (TRICOR) 48 MG tablet Take 1 tablet (48 mg total) by mouth daily. Needs doctor appointment for further refill.   furosemide  (LASIX) 40 MG tablet Take 1 tablet (40 mg total) by mouth in the morning and at bedtime. Please call office to schedule appointment for further refills [phone]. Thanks!   lisinopril (ZESTRIL) 5 MG tablet Take 1 tablet (5 mg total) by mouth daily.   metoprolol succinate (TOPROL-XL) 100 MG 24 hr tablet Take with or immediately following a meal.   nortriptyline (PAMELOR) 10 MG capsule Take 1 capsule by mouth at bedtime   Omega-3 Fatty Acids (FISH OIL) 1000 MG CAPS Take 5,000 mg by mouth daily.   potassium chloride SA (KLOR-CON) 20 MEQ tablet Take 1 tablet (20 mEq total) by mouth daily.   rosuvastatin (CRESTOR) 40 MG tablet Take 1 tablet (40 mg total) by mouth daily.   SPIRIVA HANDIHALER 18 MCG inhalation capsule Inhale 1 puff by mouth once daily   spironolactone (ALDACTONE) 25 MG tablet Take 1 tablet (25 mg total) by mouth daily.   VENTOLIN HFA 108 (90 Base) MCG/ACT inhaler INHALE 2 PUFFS BY MOUTH EVERY 6 HOURS AS NEEDED FOR WHEEZING OR SHORTNESS OF BREATH   warfarin (COUMADIN) 4 MG tablet TAKE 1 TABLET BY MOUTH ONCE DAILY AT  6PM  AS  DIRECTED   warfarin (COUMADIN) 7.5 MG tablet Take 1 tablet (7.5 mg total) by mouth daily. 4 mg for 2 days, 7.5 mg on the 3rd day, and repeat    Allergies: Patient has no known allergies.  Social History   Tobacco Use   Smoking status: Former Smoker    Packs/day: 0.50    Years: 46.00    Pack years: 23.00    Types: Cigarettes    Start date: 07/16/2017    Quit date: 05/11/2019    Years since quitting: 1.2   Smokeless tobacco: Never Used  Vaping Use   Vaping Use: Never used  Substance Use Topics   Alcohol use: No    Alcohol/week: 0.0 standard drinks   Drug use: No    Family History  Problem Relation Age of Onset   Arthritis Mother    Dementia Mother    Colon cancer Mother    Arthritis Father    Diabetes Father    Stroke Father    Colon cancer Father    Heart attack Brother    Breast cancer Sister    Seizures Sister     Cancer Brother        brain   Heart disease Brother    Heart attack Brother     Review of Systems: A 12-system review of systems was performed and was negative except as noted in the HPI.  --------------------------------------------------------------------------------------------------  Physical Exam: BP 110/72 (BP Location: Left Arm, Patient Position: Sitting, Cuff Size: Large)    Pulse 76    Ht 5\' 6"  (1.676 m)    Wt (!) 343 lb (155.6 kg)    SpO2 97%    BMI 55.36 kg/m   General:  NAD. Neck: No JVD or HJR, though body habitus limits evaluation. Lungs: Mildly diminished breath sounds throughout without wheezes or crackles. Heart: Regular rate and rhythm with mechanical S2.  No murmurs, rubs, or gallops Abdomen: Soft, nontender, nondistended. Extremities: No lower  extremity edema.  EKG: Normal sinus rhythm with incomplete left bundle branch block and nonspecific ST/T changes.  Compared with prior tracing from 01/16/2020, PVCs are no longer present.  Lab Results  Component Value Date   WBC 8.4 10/17/2019   HGB 13.3 10/17/2019   HCT 40.2 10/17/2019   MCV 87 10/17/2019   PLT 259 10/17/2019    Lab Results  Component Value Date   NA 139 05/12/2020   K 4.7 05/12/2020   CL 101 05/12/2020   CO2 22 05/12/2020   BUN 22 05/12/2020   CREATININE 1.34 (H) 05/12/2020   GLUCOSE 122 (H) 05/12/2020   ALT 16 10/17/2019    Lab Results  Component Value Date   CHOL 146 10/17/2019   HDL 42 10/17/2019   LDLCALC 44 10/17/2019   LDLDIRECT 96.4 06/04/2019   TRIG 412 (H) 10/17/2019   CHOLHDL 4.3 08/08/2019    --------------------------------------------------------------------------------------------------  ASSESSMENT AND PLAN: Chronic HFrEF due to nonischemic cardiomyopathy: Volume status appears stable but remains challenging to assess due to morbid obesity.  I do not believe that the patient is significantly volume overloaded.  We have agreed to increase lisinopril to 5 mg twice  daily in effort to optimize his GDMT.  I will check a BMP today, which should be repeated at follow-up in 1 month.  Continue furosemide 40 mg twice daily, spironolactone 25 mg daily, and metoprolol succinate 100 mg daily.  Status post AVR: No evidence of valve dysfunction.  Continue aspirin and warfarin, with ongoing INR follow-up per Dr. Wynetta Emery.  Thoracic aortic aneurysm: Continue medical therapy to prevent progression with continued follow-up through Dr. Orvan Seen.  Fluoroquinolones should be avoided.  Nonobstructive coronary artery disease: No angina reported.  Continue aspirin and statin therapy to prevent progression of disease.  LDL well controlled on last check in 10/2019.  Hyperlipidemia: Continue rosuvastatin and fenofibrate.  Tobacco use: Patient has been abstinent from tobacco for over a year.  I think it is reasonable to taper off bupropion.  We will decrease this to 150 mg daily x1 week and then stop.  Morbid obesity: Patient's BMI remains greater than 55 with multiple comorbidities.  Weight loss encouraged through diet and exercise.  Follow-up: Return to clinic in 1 month with BMP at that time.  Nelva Bush, MD 08/26/2020 11:16 AM

## 2020-08-27 ENCOUNTER — Telehealth: Payer: Self-pay | Admitting: *Deleted

## 2020-08-27 ENCOUNTER — Encounter: Payer: Self-pay | Admitting: Internal Medicine

## 2020-08-27 NOTE — Telephone Encounter (Signed)
No answer. Left detailed message with results, ok per DPR, and to call back if any questions. Included in voice mail message that results are on MyChart for patient viewing.

## 2020-08-27 NOTE — Telephone Encounter (Signed)
-----   Message from Nelva Bush, MD sent at 08/27/2020  9:48 AM EST ----- Please let Roberto [name] know that his labs are fairly stable though his creatinine is slightly higher compared to 3 months ago.  I think it is reasonable to increase lisinopril as discussed at yesterday's visit with follow-up BMP when he returns to the office in a month.

## 2020-09-03 ENCOUNTER — Encounter: Payer: Self-pay | Admitting: Family Medicine

## 2020-09-03 LAB — PROTIME-INR: INR: 3.8 — AB (ref 0.9–1.1)

## 2020-09-12 LAB — POCT INR
INR: 3 (ref 2.0–3.0)
INR: 3 — AB (ref <=1.1)

## 2020-09-17 ENCOUNTER — Other Ambulatory Visit: Payer: Self-pay | Admitting: Internal Medicine

## 2020-09-17 ENCOUNTER — Other Ambulatory Visit (HOSPITAL_COMMUNITY): Payer: Self-pay | Admitting: Cardiology

## 2020-09-23 ENCOUNTER — Ambulatory Visit: Payer: Medicare Other | Admitting: Internal Medicine

## 2020-09-23 ENCOUNTER — Other Ambulatory Visit: Payer: Self-pay | Admitting: Family Medicine

## 2020-09-24 LAB — POCT INR: INR: 3.1 — AB (ref 2.0–3.0)

## 2020-10-03 ENCOUNTER — Encounter: Payer: Self-pay | Admitting: Family Medicine

## 2020-10-03 DIAGNOSIS — Z952 Presence of prosthetic heart valve: Secondary | ICD-10-CM | POA: Diagnosis not present

## 2020-10-03 DIAGNOSIS — Z7901 Long term (current) use of anticoagulants: Secondary | ICD-10-CM | POA: Diagnosis not present

## 2020-10-03 LAB — POCT INR: INR: 2.7 (ref 2.0–3.0)

## 2020-10-06 ENCOUNTER — Other Ambulatory Visit: Payer: Self-pay

## 2020-10-06 ENCOUNTER — Telehealth: Payer: Self-pay | Admitting: *Deleted

## 2020-10-06 ENCOUNTER — Encounter: Payer: Self-pay | Admitting: Family

## 2020-10-06 ENCOUNTER — Ambulatory Visit (INDEPENDENT_AMBULATORY_CARE_PROVIDER_SITE_OTHER): Payer: Medicare Other | Admitting: Family

## 2020-10-06 VITALS — BP 104/72 | HR 97 | Ht 66.0 in | Wt 346.0 lb

## 2020-10-06 DIAGNOSIS — I712 Thoracic aortic aneurysm, without rupture, unspecified: Secondary | ICD-10-CM

## 2020-10-06 DIAGNOSIS — E74819 Disorders of glucose transport, unspecified: Secondary | ICD-10-CM

## 2020-10-06 DIAGNOSIS — G4733 Obstructive sleep apnea (adult) (pediatric): Secondary | ICD-10-CM | POA: Diagnosis not present

## 2020-10-06 DIAGNOSIS — I428 Other cardiomyopathies: Secondary | ICD-10-CM

## 2020-10-06 DIAGNOSIS — Z7901 Long term (current) use of anticoagulants: Secondary | ICD-10-CM | POA: Diagnosis not present

## 2020-10-06 DIAGNOSIS — I25118 Atherosclerotic heart disease of native coronary artery with other forms of angina pectoris: Secondary | ICD-10-CM

## 2020-10-06 DIAGNOSIS — I5022 Chronic systolic (congestive) heart failure: Secondary | ICD-10-CM

## 2020-10-06 MED ORDER — DAPAGLIFLOZIN PROPANEDIOL 10 MG PO TABS: 10.0000 mg | ORAL_TABLET | Freq: Every day | ORAL | 5 refills | Status: DC

## 2020-10-06 NOTE — Progress Notes (Signed)
Office Visit    Patient Name: Roberto [name] Date of Encounter: 10/06/2020  PCP:  Valerie Roys, DO   [place]  Cardiologist:  Nelva Bush, MD  Advanced Practice Provider:  No care team member to display Electrophysiologist:  None   Chief Complaint    Roberto [name] is a 65 y.o. male with a hx of mechanical aortic valve replacement, non obstructive coronary artery disease, tobacco use, thoracic aortic aneurysm stroke, right renal infarct, chronic systolic heart failure (LVEF 40-45%), HLD, morbid obesity, NICM  presents today for follow up of heart failure.   Past Medical History    Past Medical History:  Diagnosis Date  . Bicuspid aortic valve    a. s/p #27 Carbomedics mechanical valve on 03/25/2010; b. on Coumadin; c. TTE 12/17: EF 40-45%, moderately dilated LV with moderate LVH, AVR well-seated with 14 mmHg gradient, peak AV velocity 2.5 m/s, mild mitral valve thickening with mild MR, mildly dilated RV with mildly reduced contraction  . Cellulitis   . Chronic systolic CHF (congestive heart failure) ([place])    a. R/LHC 03/2010 showed no significant CAD, LVEDP 31 mmHg, mean AoV gradient 34 mmHg at rest and 47 mmHg with dobutamine 20 mcg/kg/min, AVA 1.0 cm^2, RA 31, RV 68/25, PA 68/47, PCWP 38. PA sat 65%. CO 6.2 L/min (Fick) and 5.3 L/min (thermodilution)  . Clotting disorder ([place])   . H/O mechanical aortic valve replacement 03/25/2010   a. #27 Carbomedics mechanical valve  . Hypercholesterolemia   . Renal infarct [facility]) 2017   Multiple right renal infarcts, likely embolic.  . Stroke ([place])   . TIA (transient ischemic attack) 05/2014   Past Surgical History:  Procedure Laterality Date  . AORTIC VALVE REPLACEMENT    . CARDIAC CATHETERIZATION  03/21/2010   No significant CAD. Severe aortic stenosis. Severely elevated left and right heart filling pressures.  Marland Kitchen CARDIAC SURGERY  2009   CHF  . CARPAL TUNNEL RELEASE Left 2005  . RIGHT HEART CATH AND  CORONARY ANGIOGRAPHY N/A 01/28/2019   Procedure: RIGHT HEART CATH AND CORONARY ANGIOGRAPHY;  Surgeon: Nelva Bush, MD;  Location: [facility];  Service: Cardiovascular;  Laterality: N/A;  . TONSILLECTOMY  1962    Allergies  No Known Allergies  History of Present Illness    Roberto [name] is a 65 y.o. male with a hx of mechanical aortic valve replacement, non obstructive coronary artery disease, tobacco use, thoracic aortic aneurysm stroke, right renal infarct, chronic systolic heart failure (LVEF 40-45%), HLD, morbid obesity, NICM last seen 08/26/20 by Dr. Saunders Revel.  He also follows with Dr. Aundra Dubin in the advanced HF clinic as well as Dr. Orvan Seen of [facility] regarding his thoracic aortic aneurysm.   Seen by Dr. Orvan Seen 09/29/19 with 5.7 cm ascending aortic aneurysm. Recommended for 6 month follow up with repeat scan as he had recently stopped smoking and required weight loss prior to surgery. Does meet criteria for repair by measurement.    R/LHC 01/2019 with moderate single-vessel CAD with 60% stenosis of large first diagonal branch. Otherwise, mild disease of mid LAd and prox RCA. Echo 01/2019 with LVEF 40-45%, impaired diastolic relaxation, RV pressure mildly elevated 35.56mmHg, LA moderately dilated, mild AV stenosis (mean gradient 59mmHg), dilation ascending aorta 4.6cm.   At clinic visit 08/26/20 with Dr. Saunders Revel noted having quit tobacco for nearly 1 year, he was recommended to taper off bupropion initially prescribed for smoking cessation. His Lisinopril was increased to 5mg  twice daily to optimize GDMT.  He presents today for follow-up.  Endorses feeling overall well.  Reports no lightheadedness, dizziness, near-syncope, syncope with increased dose of lisinopril.  He continues to abstain from tobacco use.  No formal exercise routine and overall very sedentary.  He does mow his lawn but tells me he requires multiple breaks.  He has not been wearing his CPAP and we discussed the importance of  wearing it for his cardiac health.  He tells me his mask is uncomfortable and encouraged to reach out to the provider who started it to try different mask.  He reports no edema, orthopnea, PND.  Reports persistent dyspnea on exertion which he attributes to his weight.  He is up an additional 3 pounds from last clinic visit.  Reports no chest pain, pressure, tightness.  EKGs/Labs/Other Studies Reviewed:   The following studies were reviewed today:  CT Chest 03/29/20 IMPRESSION: 1. Stable ascending thoracic aortic diameter of 5.7 x 5.7 cm. Recommend semi-annual imaging followup by CTA or MRA and referral to cardiothoracic surgery if not already obtained. This recommendation follows 2010 ACCF/AHA/AATS/ACR/ASA/SCA/SCAI/SIR/STS/SVM Guidelines for the Diagnosis and Management of Patients With Thoracic Aortic Disease. Circulation. 2010; 121: [id number]. Aortic aneurysm NOS (ICD10-I71.9). Other thoracic aortic diameter measurements as noted.   2. Status post aortic valve replacement. There is aortic atherosclerosis as well as foci of great vessel and coronary artery calcification.   3. Stable 7 x 5 mm nodular opacity in the left upper lobe which has remained stable since 2017. Stability since 2017 is felt to be indicative of benign etiology. No edema or airspace opacity. Mild bibasilar atelectatic change noted.   4.  No appreciable adenopathy.   5.  Hepatic steatosis.   Aortic aneurysm NOS (ICD10-I71.9).   Aortic Atherosclerosis (ICD10-I70.0).   [place] 01/28/19 Conclusions: 1. Moderate single-vessel coronary artery disease with 60% stenosis involving large first diagonal branch.  Otherwise, there is mild disease (up to 20%) involving the mid LAD and proximal RCA. 2. Moderately elevated left heart and pulmonary artery pressures. 3. Severely elevated right heart filling pressure. 4. Mildly reduced Fick cardiac output/index.   Recommendations: 1. Increase furosemide to 40 mg BID, with BMP  in the office in ~1 week.  Further escalation of furosemide and evidence-based heart failure therapy will need to be considered at follow-up. 2. Medical therapy and risk factor modification to prevent progression of moderate CAD. 3. Ongoing workup/management of thoracic aortic aneurysm per Dr. Orvan Seen. 4. If no evidence of bleeding or vascular injury at left radial and antecubital catheterization sites, warfarin with enoxaparin bridge can be restarted tonight.  Ongoing management of anticoagulation per Dr. Wynetta Emery. 5. Follow-up with me or APP in the office in ~2 weeks.  Echo 01/02/19 1. Challenging image quality   2. The left ventricle has mild-moderately reduced systolic function, with  an ejection fraction of 40-45%. The cavity size was normal. Left  ventricular diastolic Doppler parameters are consistent with impaired  relaxation. Left ventricular diffuse  hypokinesis.   3. The right ventricle has normal systolic function. The cavity was  normal. There is no increase in right ventricular wall thickness. Right  ventricular systolic pressure is mildly elevated with an estimated  pressure of 35.1 mmHg.   4. Left atrial size was moderately dilated.   5. The aortic valve was not well visualized. Mild stenosis of the aortic  valve.Mean gradient 15 mm Hg.   6. There is dilatation of the ascending aorta. 4.6 cm   EKG:  No EKG today.   Recent Labs:  10/17/2019: ALT 16; Hemoglobin 13.3; Platelets 259; TSH 1.280 08/26/2020: BUN 28; Creatinine, Ser 1.50; Potassium 4.3; Sodium 136  Recent Lipid Panel    Component Value Date/Time   CHOL 146 10/17/2019 1521   CHOL 177 05/17/2017 1311   CHOL 131 05/25/2014 0556   TRIG 412 (H) 10/17/2019 1521   TRIG 361 (H) 05/17/2017 1311   TRIG 167 05/25/2014 0556   HDL 42 10/17/2019 1521   HDL 30 (L) 05/25/2014 0556   CHOLHDL 4.3 08/08/2019 0841   VLDL 74 (H) 08/08/2019 0841   VLDL 72 (H) 05/17/2017 1311   VLDL 33 05/25/2014 0556   LDLCALC 44 10/17/2019 1521    LDLCALC 68 05/25/2014 0556   LDLDIRECT 96.4 06/04/2019 0712    Home Medications   No outpatient medications have been marked as taking for the 10/06/20 encounter (Appointment) with Loel Dubonnet, NP.     Review of Systems  All other systems reviewed and are otherwise negative except as noted above.  Physical Exam    VS:  There were no vitals taken for this visit. , BMI There is no height or weight on file to calculate BMI.  Wt Readings from Last 3 Encounters:  08/26/20 (!) 343 lb (155.6 kg)  07/16/20 (!) 340 lb (154.2 kg)  05/12/20 (!) 357 lb (161.9 kg)    GEN: Well nourished, overweight, well developed, in no acute distress. HEENT: normal. Neck: Supple, no JVD, carotid bruits, or masses. Cardiac: RRR, mechanical S2, no murmurs, rubs, or gallops. No clubbing, cyanosis, edema.  Respiratory:  Respirations regular and unlabored, clear to auscultation bilaterally. GI: Soft, nontender, nondistended. MS: No deformity or atrophy. Skin: Warm and dry, no rash. Neuro:  Strength and sensation are intact. Psych: Normal affect.  Assessment & Plan    1. HFrEF / NICM -volume status difficult to ascertain in the setting of morbid obesity.  Weight up 3 pounds though he reports this to being overall sedentary.  GDMT includes spironolactone 25 mg QD, Toprol 100 mg QD, lisinopril 5mg  BID.  For optimization of GDMT, start Farxiga 10 mg daily.  He was provided 2 weeks of samples and we will call him prior to sample turning out to see how he is tolerating.  Future considerations include transition from Entresto to lisinopril. BMP today for monitoring of renal function.  2. CAD - Stable with no anginal symptoms.  No indication for ischemic evaluation at this time.  GDMT includes aspirin, metoprolol, rosuvastatin.  Heart healthy diet and regular cardiovascular exercise encouraged.  3. Valvular heart disease / chronic anticoagulation - No evidence of valve dysfunction. Continue Aspirin and Warfarin.  Now checking his INR with home monitoring.  Reports readings are well controlled.  4. Thoracic aortic aneurysm -continue to follow with Dr. Orvan Seen of cardiothoracic surgery.  5. HLD / hypertriglyceridemia -continue rosuvastatin 40 mg daily.  He is due for repeat lipid panel with next fasting lab work.  Will defer today as he is not fasting.  6. Morbid obesity -weight loss via diet and exercise encouraged.  Discussed the impact on his heart failure as well as coronary artery disease.  Last A1c check in 2017.  She reports difficulty losing weight and is morbidly obese we will reassess hemoglobin A1c today.  7. OSA -not wearing his CPAP.  Long discussion regarding importance of wearing his CPAP for heart health.  Encouraged to follow-up with the ordering provider if he wishes to try different mask.  8. Tobacco use - Continued cessation encouraged.  Disposition:  Follow up in 2 month(s) with Dr. Saunders Revel or APP   Signed, Loel Dubonnet, NP 10/06/2020, 7:55 AM [place]

## 2020-10-06 NOTE — Telephone Encounter (Signed)
Received fax from pharmacy stating Wilder Glade has been denied by patient's medical insurance. Would you like to proceed with PA, since I noticed in his chart he was given samples today

## 2020-10-06 NOTE — Telephone Encounter (Signed)
Please proceed with prior auth. Farxiga Rx'd for HFrEF. We provided 2 weeks of samples but I am hoping he tolerates and then will need to fill at pharmacy.   Loel Dubonnet, NP

## 2020-10-06 NOTE — Patient Instructions (Signed)
Medication Instructions:  Your physician has recommended you make the following change in your medication:   START Farxiga 10mg  daily *This is to help strenghten your heart*  *If you need a refill on your cardiac medications before your next appointment, please call your pharmacy*   Lab Work: Your provider recommends lab work today: BMP, Hemoglobin A1c  If you have labs (blood work) drawn today and your tests are completely normal, you will receive your results only by: Marland Kitchen MyChart Message (if you have MyChart) OR . A paper copy in the mail If you have any lab test that is abnormal or we need to change your treatment, we will call you to review the results.  Testing/Procedures: None ordered today.  Follow-Up: At [facility], you and your health needs are our priority.  As part of our continuing mission to provide you with exceptional heart care, we have created designated Provider Care Teams.  These Care Teams include your primary Cardiologist (physician) and Advanced Practice Providers (APPs -  Physician Assistants and Nurse Practitioners) who all work together to provide you with the care you need, when you need it.  We recommend signing up for the patient portal called "MyChart".  Sign up information is provided on this After Visit Summary.  MyChart is used to connect with patients for Virtual Visits (Telemedicine).  Patients are able to view lab/test results, encounter notes, upcoming appointments, etc.  Non-urgent messages can be sent to your provider as well.   To learn more about what you can do with MyChart, go to [email or web address].    Your next appointment:   2 month(s)  The format for your next appointment:   In Person  Provider:   You may see Nelva Bush, MDor one of the following Advanced Practice Providers on your designated Care Team:    Murray Hodgkins, NP  Christell Faith, [name]  Marrianne Mood, [name]  Cadence Kathlen Mody, [place]  Laurann Montana, NP  Other  Instructions  Heart Healthy Diet Recommendations: A low-salt diet is recommended. Meats should be grilled, baked, or boiled. Avoid fried foods. Focus on lean protein sources like fish or chicken with vegetables and fruits. The American Heart Association is a Microbiologist!  American Heart Association Diet and Lifeystyle Recommendations   Exercise recommendations: The American Heart Association recommends 150 minutes of moderate intensity exercise weekly. Try 30 minutes of moderate intensity exercise 4-5 times per week. This could include walking, jogging, or swimming.

## 2020-10-07 LAB — BASIC METABOLIC PANEL
BUN/Creatinine Ratio: 20 (ref 10–24)
BUN: 24 mg/dL (ref 8–27)
CO2: 17 mmol/L — ABNORMAL LOW (ref 20–29)
Calcium: 9.2 mg/dL (ref 8.6–10.2)
Chloride: 102 mmol/L (ref 96–106)
Creatinine, Ser: 1.21 mg/dL (ref 0.76–1.27)
Glucose: 177 mg/dL — ABNORMAL HIGH (ref 65–99)
Potassium: 4.6 mmol/L (ref 3.5–5.2)
Sodium: 137 mmol/L (ref 134–144)
eGFR: 66 mL/min/{1.73_m2} (ref >59)

## 2020-10-07 LAB — HEMOGLOBIN A1C
Est. average glucose Bld gHb Est-mCnc: 163 mg/dL
Hgb A1c MFr Bld: 7.3 % — ABNORMAL HIGH (ref 4.8–5.6)

## 2020-10-07 NOTE — Telephone Encounter (Signed)
He has not as he was not previously diabetic (lab work yesterday showed new diabetes).  As we are prescribing for HF, not diabetes it should still be approved since Metformin/Glipizide won't treat HF. I would just ensure under ICD10 you are putting I50.42  Roberto Dubonnet, NP

## 2020-10-07 NOTE — Telephone Encounter (Signed)
Thanks, information has been submitted for PA. Awaiting a response from [employer].

## 2020-10-07 NOTE — Telephone Encounter (Signed)
[employer] [facility] has approved Farxiga 10 mg through 07/25/2020.

## 2020-10-07 NOTE — Telephone Encounter (Signed)
Notified [name] (friend) who is on the DPR that the [facility] has been approved.  [place] has been notified of the approval. Bufford Spikes.

## 2020-10-07 NOTE — Telephone Encounter (Signed)
Has he ever been on Jardiance, Metformin, Glipizide, Invokana or Tradjenta? I did not see any of these in his chart, but maybe I overlooked.

## 2020-10-12 ENCOUNTER — Telehealth: Payer: Self-pay

## 2020-10-12 ENCOUNTER — Encounter: Payer: Self-pay | Admitting: Family Medicine

## 2020-10-12 LAB — POCT INR: INR: 2.6 (ref 2.0–3.0)

## 2020-10-12 NOTE — Telephone Encounter (Signed)
Called kim to schedule on a Monday with Dr Wynetta Emery no answer left vm   Copied from [place] [phone]. Topic: General - Other >> Oct 12, 2020  8:10 AM Leward Quan A wrote: Reason for CRM: Patient caregiver [name] called in to reschedule his appointment. Need something on a Monday earlier morning but there was no early appointments with Dr Wynetta Emery on a Monday she wanted to know if it is possible for him to see another provider. Please call [name] if its ok with Dr Wynetta Emery to schedule appointment with a different provider Ph# [phone]

## 2020-10-17 ENCOUNTER — Emergency Department
Admission: EM | Admit: 2020-10-17 | Discharge: 2020-10-17 | Disposition: A | Discharge status: Home / Self Care | Payer: Medicare Other | Attending: Emergency Medicine | Admitting: Emergency Medicine

## 2020-10-17 ENCOUNTER — Other Ambulatory Visit: Payer: Self-pay

## 2020-10-17 DIAGNOSIS — E1165 Type 2 diabetes mellitus with hyperglycemia: Secondary | ICD-10-CM | POA: Diagnosis not present

## 2020-10-17 DIAGNOSIS — S61213A Laceration without foreign body of left middle finger without damage to nail, initial encounter: Secondary | ICD-10-CM | POA: Diagnosis not present

## 2020-10-17 DIAGNOSIS — Y93E8 Activity, other personal hygiene: Secondary | ICD-10-CM | POA: Diagnosis not present

## 2020-10-17 DIAGNOSIS — Z794 Long term (current) use of insulin: Secondary | ICD-10-CM | POA: Diagnosis not present

## 2020-10-17 DIAGNOSIS — J449 Chronic obstructive pulmonary disease, unspecified: Secondary | ICD-10-CM | POA: Diagnosis not present

## 2020-10-17 DIAGNOSIS — Z7901 Long term (current) use of anticoagulants: Secondary | ICD-10-CM | POA: Insufficient documentation

## 2020-10-17 DIAGNOSIS — Z79899 Other long term (current) drug therapy: Secondary | ICD-10-CM | POA: Diagnosis not present

## 2020-10-17 DIAGNOSIS — Z7982 Long term (current) use of aspirin: Secondary | ICD-10-CM | POA: Insufficient documentation

## 2020-10-17 DIAGNOSIS — I2510 Atherosclerotic heart disease of native coronary artery without angina pectoris: Secondary | ICD-10-CM | POA: Diagnosis not present

## 2020-10-17 DIAGNOSIS — I5022 Chronic systolic (congestive) heart failure: Secondary | ICD-10-CM | POA: Insufficient documentation

## 2020-10-17 DIAGNOSIS — Z7984 Long term (current) use of oral hypoglycemic drugs: Secondary | ICD-10-CM | POA: Insufficient documentation

## 2020-10-17 DIAGNOSIS — W268XXA Contact with other sharp object(s), not elsewhere classified, initial encounter: Secondary | ICD-10-CM | POA: Insufficient documentation

## 2020-10-17 DIAGNOSIS — I110 Hypertensive heart disease with heart failure: Secondary | ICD-10-CM | POA: Insufficient documentation

## 2020-10-17 DIAGNOSIS — Z87891 Personal history of nicotine dependence: Secondary | ICD-10-CM | POA: Diagnosis not present

## 2020-10-17 DIAGNOSIS — Z23 Encounter for immunization: Secondary | ICD-10-CM | POA: Diagnosis not present

## 2020-10-17 DIAGNOSIS — S6992XA Unspecified injury of left wrist, hand and finger(s), initial encounter: Secondary | ICD-10-CM | POA: Diagnosis present

## 2020-10-17 MED ORDER — LIDOCAINE HCL (PF) 1 % IJ SOLN
2.0000 mL | Freq: Once | INTRAMUSCULAR | Status: AC
  Administered 2020-10-17: 2 mL
  Filled 2020-10-17: qty 5

## 2020-10-17 MED ORDER — TETANUS-DIPHTH-ACELL PERTUSSIS 5-2.5-18.5 LF-MCG/0.5 IM SUSY
0.5000 mL | PREFILLED_SYRINGE | Freq: Once | INTRAMUSCULAR | Status: AC
  Administered 2020-10-17: 0.5 mL via INTRAMUSCULAR
  Filled 2020-10-17: qty 0.5

## 2020-10-17 MED ORDER — CEPHALEXIN 500 MG PO CAPS: 500.0000 mg | ORAL_CAPSULE | Freq: Three times a day (TID) | ORAL | 0 refills | Status: DC

## 2020-10-17 NOTE — ED Notes (Signed)
See triage note  Presents with laceration to left 3 digits  States he cut his finger while cleaning up

## 2020-10-17 NOTE — ED Provider Notes (Signed)
[facility] Emergency Department Provider Note ____________________________________________  Time seen: 1020  I have reviewed the triage vital signs and the nursing notes.  HISTORY  Chief Complaint  Laceration   HPI Roberto [name] is a 65 y.o. male presents to the ER today with complaint of laceration of his left middle finger.  He reports this occurred 1 hour PTA while pushing the trash down in the trash can in preparation of taking it out.  He is unsure of what he cut it on but it was possibly an aluminum can.  He reports he did have difficulty getting the bleeding controlled as he is on Coumadin.  He reports his last INR was 2.1, due for repeat INR today.  He does have a history of DM 2, recent A1c was 7.7%.  He is not currently on any oral diabetic medication at this time but will be following up with his PCP to discuss this soon.  His last tetanus was in 2015.  Past Medical History:  Diagnosis Date  . Bicuspid aortic valve    a. s/p #27 Carbomedics mechanical valve on 03/25/2010; b. on Coumadin; c. TTE 12/17: EF 40-45%, moderately dilated LV with moderate LVH, AVR well-seated with 14 mmHg gradient, peak AV velocity 2.5 m/s, mild mitral valve thickening with mild MR, mildly dilated RV with mildly reduced contraction  . Cellulitis   . Chronic systolic CHF (congestive heart failure) ([place])    a. R/LHC 03/2010 showed no significant CAD, LVEDP 31 mmHg, mean AoV gradient 34 mmHg at rest and 47 mmHg with dobutamine 20 mcg/kg/min, AVA 1.0 cm^2, RA 31, RV 68/25, PA 68/47, PCWP 38. PA sat 65%. CO 6.2 L/min ([place]) and 5.3 L/min (thermodilution)  . Clotting disorder ([place])   . H/O mechanical aortic valve replacement 03/25/2010   a. #27 Carbomedics mechanical valve  . Hypercholesterolemia   . Renal infarct [facility]) 2017   Multiple right renal infarcts, likely embolic.  . Stroke ([place])   . TIA (transient ischemic attack) 05/2014    Patient Active Problem List   Diagnosis Date  Noted  . Coronary artery disease involving native coronary artery of native heart without angina pectoris 09/10/2019  . Osteoarthritis of spine with radiculopathy, lumbar region 11/21/2018  . History of aortic stenosis 06/05/2018  . Chronic gout without tophus 04/03/2018  . Valvular heart disease 01/17/2018  . Morbid obesity ([place]) 01/17/2018  . Chronic HFrEF (heart failure with reduced ejection fraction) ([place]) 09/27/2016  . Renal infarct ([place]) 05/01/2016  . Diabetes mellitus type 2, diet-controlled ([place]) 04/19/2016  . Chronic bilateral low back pain without sciatica 04/19/2016  . Thoracic aortic aneurysm without rupture ([place]) 03/16/2016  . Aortic atherosclerosis ([place]) 03/16/2016  . Tobacco abuse 03/14/2016  . NICM (nonischemic cardiomyopathy) ([place]) 03/02/2016  . COPD (chronic obstructive pulmonary disease) ([place]) 09/09/2015  . H/O mechanical aortic valve replacement 02/09/2015  . Essential hypertension 02/09/2015  . Mixed hyperlipidemia 02/09/2015    Past Surgical History:  Procedure Laterality Date  . AORTIC VALVE REPLACEMENT    . CARDIAC CATHETERIZATION  03/21/2010   No significant CAD. Severe aortic stenosis. Severely elevated left and right heart filling pressures.  Marland Kitchen CARDIAC SURGERY  2009   CHF  . CARPAL TUNNEL RELEASE Left 2005  . RIGHT HEART CATH AND CORONARY ANGIOGRAPHY N/A 01/28/2019   Procedure: RIGHT HEART CATH AND CORONARY ANGIOGRAPHY;  Surgeon: Nelva Bush, MD;  Location: [facility];  Service: Cardiovascular;  Laterality: N/A;  . TONSILLECTOMY  1962  Prior to Admission medications   Medication Sig Start Date End Date Taking? Authorizing Provider  cephALEXin (KEFLEX) 500 MG capsule Take 1 capsule (500 mg total) by mouth 3 (three) times daily for 10 days. 10/17/20 10/27/20 Yes Harly Pipkins, Coralie Keens, NP  ADVAIR DISKUS 250-50 MCG/DOSE AEPB INHALE 1 DOSE BY MOUTH TWICE DAILY 05/24/20   [name], [name], DO  albuterol (PROVENTIL) (2.5 MG/3ML) 0.083% nebulizer  solution USE 1 VIAL IN NEBULIZER EVERY 6 HOURS AS NEEDED FOR WHEEZING OR  SHORTNESS  OF  BREATH 07/23/20   Park Liter P, DO  allopurinol (ZYLOPRIM) 100 MG tablet Take 1 tablet by mouth once daily 05/24/20   Park Liter P, DO  aspirin 81 MG chewable tablet Chew 81 mg by mouth daily.    [provider]  buPROPion (WELLBUTRIN XL) 150 MG 24 hr tablet Take 1 tablet (150 mg total) by mouth daily for 7 days. Then stop. 08/26/20 09/02/20  End, Harrell Gave, MD  cyclobenzaprine (FLEXERIL) 10 MG tablet Take 1 tablet (10 mg total) by mouth 3 times/day as needed-between meals & bedtime for muscle spasms. 12/10/18   [name], [name], DO  dapagliflozin propanediol (FARXIGA) 10 MG TABS tablet Take 1 tablet (10 mg total) by mouth daily. 10/06/20   Loel Dubonnet, NP  EQ ALLERGY RELIEF, CETIRIZINE, 10 MG tablet Take 1 tablet by mouth once daily 09/23/20   Wynetta Emery, [name], DO  fenofibrate (TRICOR) 48 MG tablet TAKE 1 TABLET BY MOUTH ONCE DAILY . APPOINTMENT REQUIRED FOR FUTURE REFILLS 09/20/20   Larey Dresser, MD  furosemide (LASIX) 40 MG tablet Take 1 tablet (40 mg total) by mouth in the morning and at bedtime. Please call office to schedule appointment for further refills [phone]. Thanks! 05/10/20   End, Harrell Gave, MD  lisinopril (ZESTRIL) 5 MG tablet Take 1 tablet (5 mg total) by mouth 2 (two) times daily. 08/26/20 11/24/20  End, Harrell Gave, MD  metoprolol succinate (TOPROL-XL) 100 MG 24 hr tablet TAKE 1 TABLET BY MOUTH ONCE DAILY WITH  OR  IMMEDIATELY  FOLLOWING  A  MEAL 09/20/20   End, Harrell Gave, MD  nortriptyline (PAMELOR) 10 MG capsule Take 1 capsule by mouth at bedtime 08/24/20   [name], [name], DO  Omega-3 Fatty Acids (FISH OIL) 1000 MG CAPS Take 5,000 mg by mouth daily.    [provider]  potassium chloride SA (KLOR-CON) 20 MEQ tablet Take 1 tablet (20 mEq total) by mouth daily. 01/16/20   Loel Dubonnet, NP  rosuvastatin (CRESTOR) 40 MG tablet Take 1 tablet (40 mg total) by  mouth daily. 07/22/20   Loel Dubonnet, NP  SPIRIVA HANDIHALER 18 MCG inhalation capsule Inhale 1 puff by mouth once daily 05/24/20   Park Liter P, DO  spironolactone (ALDACTONE) 25 MG tablet Take 1 tablet by mouth once daily 09/20/20   End, Harrell Gave, MD  VENTOLIN HFA 108 (90 Base) MCG/ACT inhaler INHALE 2 PUFFS BY MOUTH EVERY 6 HOURS AS NEEDED FOR WHEEZING OR SHORTNESS OF BREATH 08/17/20   [name], [name], DO  warfarin (COUMADIN) 4 MG tablet TAKE 1 TABLET BY MOUTH ONCE DAILY AT  6PM  AS  DIRECTED 06/23/20   Park Liter P, DO  warfarin (COUMADIN) 7.5 MG tablet Take 1 tablet (7.5 mg total) by mouth daily. 4 mg for 2 days, 7.5 mg on the 3rd day, and repeat 10/06/19   Valerie Roys, DO    Allergies Patient has no known allergies.  Family History  Problem Relation Age of  Onset  . Arthritis Mother   . Dementia Mother   . Colon cancer Mother   . Arthritis Father   . Diabetes Father   . Stroke Father   . Colon cancer Father   . Heart attack Brother   . Breast cancer Sister   . Seizures Sister   . Cancer Brother        brain  . Heart disease Brother   . Heart attack Brother     Social History Social History   Tobacco Use  . Smoking status: Former Smoker    Packs/day: 0.50    Years: 46.00    Pack years: 23.00    Types: Cigarettes    Start date: 07/16/2017    Quit date: 05/11/2019    Years since quitting: 1.4  . Smokeless tobacco: Never Used  Vaping Use  . Vaping Use: Never used  Substance Use Topics  . Alcohol use: No    Alcohol/week: 0.0 standard drinks  . Drug use: No    Review of Systems  Constitutional: Negative for fever chills or body aches. Cardiovascular: Negative for chest pain or chest tightness. Respiratory: Negative for shortness of breath. Musculoskeletal: Negative for finger pain or swelling. Skin: Positive for laceration to left middle finger. Neurological: Negative for focal weakness, tingling or  numbness. ____________________________________________  PHYSICAL EXAM:  VITAL SIGNS: ED Triage Vitals  Enc Vitals Group     BP 10/17/20 1007 127/71     Pulse Rate 10/17/20 1007 83     Resp 10/17/20 1007 18     Temp 10/17/20 1007 98.3 F (36.8 C)     Temp Source 10/17/20 1007 Oral     SpO2 10/17/20 1007 96 %     Weight 10/17/20 1021 (!) 345 lb 14.4 oz (156.9 kg)     Height 10/17/20 1021 5\' 6"  (1.676 m)     Head Circumference --      Peak Flow --      Pain Score 10/17/20 0958 0     Pain Loc --      Pain Edu? --      Excl. in [place]? --     Constitutional: Alert and oriented.  Obese, in no distress. Head: Normocephalic. Eyes: Normal extraocular movements Cardiovascular: Normal rate, regular rhythm.  Radial pulse 2+ on the left Respiratory: Normal respiratory effort. No wheezes/rales/rhonchi. Musculoskeletal: Normal flexion and extension of the left middle finger.  No joint swelling noted. Neurologic:  Normal speech and language. No gross focal neurologic deficits are appreciated. Skin:  1 cm linear laceration noted of the tip of the left middle finger, bleeding controlled. ____________________________________________  PROCEDURES  .Marland KitchenLaceration Repair  Date/Time: 10/17/2020 11:00 AM Performed by: Jearld Fenton, NP Authorized by: Jearld Fenton, NP   Consent:    Consent obtained:  Verbal   Consent given by:  Patient and spouse   Risks, benefits, and alternatives were discussed: yes     Risks discussed:  Infection, pain and poor cosmetic result Universal protocol:    Procedure explained and questions answered to patient or proxy's satisfaction: yes     Relevant documents present and verified: no     Test results available: no     Imaging studies available: no     Required blood products, implants, devices, and special equipment available: no     Site/side marked: yes     Immediately prior to procedure, a time out was called: yes     Patient identity confirmed:  Verbally  with patient and arm band Anesthesia:    Anesthesia method:  Local infiltration   Local anesthetic:  Lidocaine 1% w/o epi Laceration details:    Location:  Finger   Finger location:  L long finger   Length (cm):  1   Depth (mm):  3 Pre-procedure details:    Preparation:  Patient was prepped and draped in usual sterile fashion Exploration:    Limited defect created (wound extended): no     Hemostasis achieved with:  Direct pressure   Imaging outcome: foreign body not noted     Wound exploration: wound explored through full range of motion and entire depth of wound visualized     Contaminated: no   Treatment:    Area cleansed with:  Povidone-iodine and saline   Amount of cleaning:  Extensive   Irrigation solution:  Sterile saline   Irrigation method:  Syringe   Debridement:  None   Undermining:  None   Scar revision: no   Skin repair:    Repair method:  Sutures   Suture size:  5-0   Suture material:  Nylon   Suture technique:  Simple interrupted   Number of sutures:  4 Approximation:    Approximation:  Close Repair type:    Repair type:  Simple Post-procedure details:    Dressing:  Bulky dressing   Procedure completion:  Tolerated well, no immediate complications    ____________________________________________  INITIAL IMPRESSION / ASSESSMENT AND PLAN / ED COURSE  Laceration of Left Middle Finger, with Hx of DM 2, on Anticoagulation:  Tdap today No indication for xray at this time Laceration sutured- see procedure note Will place on prophylactic antibiotics given hx of DM 2, uncontrolled off meds RX for Keflex 500 mg TID x 5 days Advised him to follow up with PCP in 1 week for suture removal  ____________________________________________  FINAL CLINICAL IMPRESSION(S) / ED DIAGNOSES  Final diagnoses:  Laceration of left middle finger without foreign body without damage to nail, initial encounter  Type 2 diabetes mellitus with hyperglycemia, without long-term  current use of insulin (HCC)  Chronic anticoagulation      Jearld Fenton, NP 10/17/20 1108    Blake Divine, MD 10/17/20 [phone]

## 2020-10-17 NOTE — Discharge Instructions (Addendum)
You were seen today for left finger laceration.  You received 4 sutures.  I am placing you on prophylactic antibiotics 3 times a day for the next 5 days.  Please take all medication as prescribed.  Please follow-up with your PCP in 1 week for suture removal.

## 2020-10-17 NOTE — ED Triage Notes (Signed)
Pt states he but his left middle finger on something in the trash when he was taking it out today. Bandage in place on arrival, bleeding controlled

## 2020-10-18 ENCOUNTER — Ambulatory Visit: Payer: Medicare Other | Admitting: Family Medicine

## 2020-10-24 ENCOUNTER — Encounter: Payer: Self-pay | Admitting: Family Medicine

## 2020-10-24 LAB — POCT INR: INR: 2.5 (ref 2.0–3.0)

## 2020-10-25 ENCOUNTER — Encounter: Payer: Self-pay | Admitting: Family Medicine

## 2020-10-25 ENCOUNTER — Other Ambulatory Visit: Payer: Self-pay

## 2020-10-25 ENCOUNTER — Ambulatory Visit (INDEPENDENT_AMBULATORY_CARE_PROVIDER_SITE_OTHER): Payer: Medicare Other | Admitting: Family Medicine

## 2020-10-25 VITALS — BP 94/61 | HR 80 | Temp 98.7°F | Ht 64.7 in | Wt 341.4 lb

## 2020-10-25 DIAGNOSIS — E782 Mixed hyperlipidemia: Secondary | ICD-10-CM

## 2020-10-25 DIAGNOSIS — I2510 Atherosclerotic heart disease of native coronary artery without angina pectoris: Secondary | ICD-10-CM

## 2020-10-25 DIAGNOSIS — I700 Atherosclerosis of aorta: Secondary | ICD-10-CM | POA: Diagnosis not present

## 2020-10-25 DIAGNOSIS — R3911 Hesitancy of micturition: Secondary | ICD-10-CM

## 2020-10-25 DIAGNOSIS — I10 Essential (primary) hypertension: Secondary | ICD-10-CM

## 2020-10-25 DIAGNOSIS — I428 Other cardiomyopathies: Secondary | ICD-10-CM

## 2020-10-25 DIAGNOSIS — J441 Chronic obstructive pulmonary disease with (acute) exacerbation: Secondary | ICD-10-CM

## 2020-10-25 DIAGNOSIS — M1A9XX0 Chronic gout, unspecified, without tophus (tophi): Secondary | ICD-10-CM

## 2020-10-25 DIAGNOSIS — I712 Thoracic aortic aneurysm, without rupture, unspecified: Secondary | ICD-10-CM

## 2020-10-25 DIAGNOSIS — I5022 Chronic systolic (congestive) heart failure: Secondary | ICD-10-CM

## 2020-10-25 DIAGNOSIS — E119 Type 2 diabetes mellitus without complications: Secondary | ICD-10-CM

## 2020-10-25 MED ORDER — NORTRIPTYLINE HCL 10 MG PO CAPS: 10.0000 mg | ORAL_CAPSULE | Freq: Every day | ORAL | 1 refills | Status: DC

## 2020-10-25 MED ORDER — WARFARIN SODIUM 7.5 MG PO TABS: 7.5000 mg | ORAL_TABLET | Freq: Every day | ORAL | 1 refills | Status: DC

## 2020-10-25 MED ORDER — TRELEGY ELLIPTA 100-62.5-25 MCG/INH IN AEPB: 1.0000 | INHALATION_SPRAY | Freq: Every day | RESPIRATORY_TRACT | 11 refills | Status: DC

## 2020-10-25 MED ORDER — VENTOLIN HFA 108 (90 BASE) MCG/ACT IN AERS: INHALATION_SPRAY | RESPIRATORY_TRACT | 1 refills | Status: DC

## 2020-10-25 MED ORDER — ALBUTEROL SULFATE (2.5 MG/3ML) 0.083% IN NEBU: INHALATION_SOLUTION | RESPIRATORY_TRACT | 1 refills | Status: DC

## 2020-10-25 MED ORDER — WARFARIN SODIUM 4 MG PO TABS: ORAL_TABLET | ORAL | 1 refills | Status: DC

## 2020-10-25 MED ORDER — METFORMIN HCL ER 500 MG PO TB24: 500.0000 mg | ORAL_TABLET | Freq: Every day | ORAL | 1 refills | Status: DC

## 2020-10-25 MED ORDER — ROSUVASTATIN CALCIUM 40 MG PO TABS: 40.0000 mg | ORAL_TABLET | Freq: Every day | ORAL | 1 refills | Status: DC

## 2020-10-25 MED ORDER — ALLOPURINOL 100 MG PO TABS: 100.0000 mg | ORAL_TABLET | Freq: Every day | ORAL | 1 refills | Status: DC

## 2020-10-25 MED ORDER — FENOFIBRATE 48 MG PO TABS: ORAL_TABLET | ORAL | 1 refills | Status: DC

## 2020-10-25 NOTE — Assessment & Plan Note (Signed)
A1c up to 7.4 from 6.4- will start 500mg  metformin. Continue farxiga for CHF. Recheck 3 months. Call with any concerns.

## 2020-10-25 NOTE — Assessment & Plan Note (Signed)
Will keep BP, cholesterol and sugars under good control. Continue to follow with cardiology. Call with any concerns.  

## 2020-10-25 NOTE — Assessment & Plan Note (Signed)
Rechecking labs today. Await results. Treat as needed.  °

## 2020-10-25 NOTE — Assessment & Plan Note (Signed)
Running low. Follows with cardiology. Euvolemic. Call with any concerns.

## 2020-10-25 NOTE — Progress Notes (Signed)
BP 94/61   Pulse 80   Temp 98.7 F (37.1 C) (Oral)   Ht 5' 4.7" (1.643 m)   Wt (!) 341 lb 6.4 oz (154.9 kg)   SpO2 94%   BMI 57.34 kg/m    Subjective:    Patient ID: Roberto [name], male    DOB: 1955/09/10, 65 y.o.   MRN: [id number]  HPI: Roberto [name] is a 65 y.o. male  Chief Complaint  Patient presents with  . Diabetes  . Hyperlipidemia  . Hypertension   DIABETES Hypoglycemic episodes:no Polydipsia/polyuria: no Visual disturbance: no Chest pain: no Paresthesias: no Glucose Monitoring: no  Accucheck frequency: Not Checking Taking Insulin?: no Blood Pressure Monitoring: not checking Retinal Examination: Not up to Date Foot Exam: Up to Date Diabetic Education: Completed Pneumovax: Up to Date Influenza: Up to Date Aspirin: no  HYPERTENSION / HYPERLIPIDEMIA Satisfied with current treatment? yes Duration of hypertension: chronic BP monitoring frequency: not checking BP medication side effects: no Duration of hyperlipidemia: chronic Cholesterol medication side effects: no Cholesterol supplements: none Past cholesterol medications: crestor and fenofibrate Medication compliance: excellent compliance Aspirin: no Recent stressors: no Recurrent headaches: no Visual changes: no Palpitations: no Dyspnea: yes Chest pain: no Lower extremity edema: no Dizzy/lightheaded: no  COPD COPD status: stable Satisfied with current treatment?: yes Oxygen use: no Dyspnea frequency: occasionally Cough frequency: rarely Rescue inhaler frequency: rarely   Limitation of activity: yes Pneumovax: Up to Date Influenza: Up to Date  No gout flares. Tolerating his medicine well  Relevant past medical, surgical, family and social history reviewed and updated as indicated. Interim medical history since our last visit reviewed. Allergies and medications reviewed and updated.  Review of Systems  Constitutional: Negative.   Respiratory: Positive for shortness of breath.  Negative for apnea, cough, choking, chest tightness, wheezing and stridor.   Cardiovascular: Negative.   Gastrointestinal: Negative.   Musculoskeletal: Negative.   Psychiatric/Behavioral: Negative.     Per HPI unless specifically indicated above     Objective:    BP 94/61   Pulse 80   Temp 98.7 F (37.1 C) (Oral)   Ht 5' 4.7" (1.643 m)   Wt (!) 341 lb 6.4 oz (154.9 kg)   SpO2 94%   BMI 57.34 kg/m   Wt Readings from Last 3 Encounters:  10/25/20 (!) 341 lb 6.4 oz (154.9 kg)  10/17/20 (!) 345 lb 14.4 oz (156.9 kg)  10/06/20 (!) 346 lb (156.9 kg)    Physical Exam Vitals and nursing note reviewed.  Constitutional:      General: He is not in acute distress.    Appearance: Normal appearance. He is obese. He is not ill-appearing, toxic-appearing or diaphoretic.  HENT:     Head: Normocephalic and atraumatic.     Right Ear: External ear normal.     Left Ear: External ear normal.     Nose: Nose normal.     Mouth/Throat:     Mouth: Mucous membranes are moist.     Pharynx: Oropharynx is clear.  Eyes:     General: No scleral icterus.       Right eye: No discharge.        Left eye: No discharge.     Extraocular Movements: Extraocular movements intact.     Conjunctiva/sclera: Conjunctivae normal.     Pupils: Pupils are equal, round, and reactive to light.  Cardiovascular:     Rate and Rhythm: Normal rate and regular rhythm.     Pulses: Normal pulses.  Heart sounds: Murmur heard.  No friction rub. No gallop.   Pulmonary:     Effort: Pulmonary effort is normal. No respiratory distress.     Breath sounds: Normal breath sounds. No stridor. No wheezing, rhonchi or rales.  Chest:     Chest wall: No tenderness.  Musculoskeletal:        General: Normal range of motion.     Cervical back: Normal range of motion and neck supple.  Skin:    General: Skin is warm and dry.     Capillary Refill: Capillary refill takes less than 2 seconds.     Coloration: Skin is not jaundiced or  pale.     Findings: No bruising, erythema, lesion or rash.  Neurological:     General: No focal deficit present.     Mental Status: He is alert and oriented to person, place, and time. Mental status is at baseline.  Psychiatric:        Mood and Affect: Mood normal.        Behavior: Behavior normal.        Thought Content: Thought content normal.        Judgment: Judgment normal.     Results for orders placed or performed in visit on 41/96/22  Basic metabolic panel  Result Value Ref Range   Glucose 177 (H) 65 - 99 mg/dL   BUN 24 8 - 27 mg/dL   Creatinine, Ser 1.21 0.76 - 1.27 mg/dL   eGFR 66 >59 mL/min/1.73   BUN/Creatinine Ratio 20 10 - 24   Sodium 137 134 - 144 mmol/L   Potassium 4.6 3.5 - 5.2 mmol/L   Chloride 102 96 - 106 mmol/L   CO2 17 (L) 20 - 29 mmol/L   Calcium 9.2 8.6 - 10.2 mg/dL  HgB A1c  Result Value Ref Range   Hgb A1c MFr Bld 7.3 (H) 4.8 - 5.6 %   Est. average glucose Bld gHb Est-mCnc 163 mg/dL      Assessment & Plan:   Problem List Items Addressed This Visit      Cardiovascular and Mediastinum   Essential hypertension    Running low. Follows with cardiology. Euvolemic. Call with any concerns.       Relevant Medications   rosuvastatin (CRESTOR) 40 MG tablet   fenofibrate (TRICOR) 48 MG tablet   warfarin (COUMADIN) 4 MG tablet   warfarin (COUMADIN) 7.5 MG tablet   Other Relevant Orders   Comprehensive metabolic panel   CBC with Differential/Platelet   TSH   Microalbumin, Urine Waived   NICM (nonischemic cardiomyopathy) ([place])    Will keep BP, cholesterol and sugars under good control. Continue to follow with cardiology. Call with any concerns.       Relevant Medications   rosuvastatin (CRESTOR) 40 MG tablet   fenofibrate (TRICOR) 48 MG tablet   warfarin (COUMADIN) 4 MG tablet   warfarin (COUMADIN) 7.5 MG tablet   Thoracic aortic aneurysm without rupture (HCC)    Will keep BP, cholesterol and sugars under good control. Continue to follow with  vascular. Call with any concerns.       Relevant Medications   rosuvastatin (CRESTOR) 40 MG tablet   fenofibrate (TRICOR) 48 MG tablet   warfarin (COUMADIN) 4 MG tablet   warfarin (COUMADIN) 7.5 MG tablet   Other Relevant Orders   Comprehensive metabolic panel   CBC with Differential/Platelet   Aortic atherosclerosis (HCC)    Will keep BP, cholesterol and sugars under good control. Continue to  follow with cardiology. Call with any concerns.       Relevant Medications   rosuvastatin (CRESTOR) 40 MG tablet   fenofibrate (TRICOR) 48 MG tablet   warfarin (COUMADIN) 4 MG tablet   warfarin (COUMADIN) 7.5 MG tablet   Other Relevant Orders   Comprehensive metabolic panel   CBC with Differential/Platelet   Lipid Panel w/o Chol/HDL Ratio   Chronic HFrEF (heart failure with reduced ejection fraction) ([place])    Follows with cardiology. Stable. Continue to monitor. Call with any concerns.       Relevant Medications   rosuvastatin (CRESTOR) 40 MG tablet   fenofibrate (TRICOR) 48 MG tablet   warfarin (COUMADIN) 4 MG tablet   warfarin (COUMADIN) 7.5 MG tablet     Respiratory   COPD (chronic obstructive pulmonary disease) (HCC)    Will change his advair and spiriva to trelegy. Recheck 3 months. Call with any concerns. Continue to monitor.       Relevant Medications   Fluticasone-Umeclidin-Vilant (TRELEGY ELLIPTA) 100-62.5-25 MCG/INH AEPB   VENTOLIN HFA 108 (90 Base) MCG/ACT inhaler   albuterol (PROVENTIL) (2.5 MG/3ML) 0.083% nebulizer solution   Other Relevant Orders   Comprehensive metabolic panel   CBC with Differential/Platelet     Endocrine   Diabetes mellitus type 2, diet-controlled (HCC) - Primary    A1c up to 7.4 from 6.4- will start $RemoveBe'500mg'[id number]$  metformin. Continue farxiga for CHF. Recheck 3 months. Call with any concerns.       Relevant Medications   rosuvastatin (CRESTOR) 40 MG tablet   metFORMIN (GLUCOPHAGE XR) 500 MG 24 hr tablet   Other Relevant Orders   Comprehensive  metabolic panel   CBC with Differential/Platelet   Urinalysis, Routine w reflex microscopic   Microalbumin, Urine Waived   Bayer DCA Hb A1c Waived     Other   Mixed hyperlipidemia    Under good control on current regimen. Continue current regimen. Continue to monitor. Call with any concerns. Refills given. Labs drawn today.       Relevant Medications   rosuvastatin (CRESTOR) 40 MG tablet   fenofibrate (TRICOR) 48 MG tablet   warfarin (COUMADIN) 4 MG tablet   warfarin (COUMADIN) 7.5 MG tablet   Other Relevant Orders   Comprehensive metabolic panel   CBC with Differential/Platelet   Lipid Panel w/o Chol/HDL Ratio   Chronic gout without tophus    Rechecking labs today. Await results. Treat as needed.       Relevant Orders   Comprehensive metabolic panel   CBC with Differential/Platelet   Uric acid    Other Visit Diagnoses    Hesitancy       Labs drawn today. Await results.    Relevant Orders   PSA       Follow up plan: Return in about 3 months (around 01/24/2021).

## 2020-10-25 NOTE — Assessment & Plan Note (Signed)
Will keep BP, cholesterol and sugars under good control. Continue to follow with vascular. Call with any concerns.

## 2020-10-25 NOTE — Assessment & Plan Note (Signed)
Follows with cardiology. Stable. Continue to monitor. Call with any concerns.

## 2020-10-25 NOTE — Assessment & Plan Note (Signed)
Will change his advair and spiriva to trelegy. Recheck 3 months. Call with any concerns. Continue to monitor.

## 2020-10-25 NOTE — Assessment & Plan Note (Signed)
Under good control on current regimen. Continue current regimen. Continue to monitor. Call with any concerns. Refills given. Labs drawn today.   

## 2020-10-26 ENCOUNTER — Other Ambulatory Visit: Payer: Self-pay | Admitting: Family Medicine

## 2020-10-26 DIAGNOSIS — N289 Disorder of kidney and ureter, unspecified: Secondary | ICD-10-CM

## 2020-10-26 LAB — COMPREHENSIVE METABOLIC PANEL
ALT: 25 IU/L (ref 0–44)
AST: 22 IU/L (ref 0–40)
Albumin/Globulin Ratio: 1.4 (ref 1.2–2.2)
Albumin: 4.6 g/dL (ref 3.8–4.8)
Alkaline Phosphatase: 48 IU/L (ref 44–121)
BUN/Creatinine Ratio: 25 — ABNORMAL HIGH (ref 10–24)
BUN: 39 mg/dL — ABNORMAL HIGH (ref 8–27)
Bilirubin Total: 0.2 mg/dL (ref 0.0–1.2)
CO2: 19 mmol/L — ABNORMAL LOW (ref 20–29)
Calcium: 9.7 mg/dL (ref 8.6–10.2)
Chloride: 96 mmol/L (ref 96–106)
Creatinine, Ser: 1.57 mg/dL — ABNORMAL HIGH (ref 0.76–1.27)
Globulin, Total: 3.4 g/dL (ref 1.5–4.5)
Glucose: 157 mg/dL — ABNORMAL HIGH (ref 65–99)
Potassium: 5.1 mmol/L (ref 3.5–5.2)
Sodium: 134 mmol/L (ref 134–144)
Total Protein: 8 g/dL (ref 6.0–8.5)
eGFR: 48 mL/min/{1.73_m2} — ABNORMAL LOW (ref >59)

## 2020-10-26 LAB — LIPID PANEL W/O CHOL/HDL RATIO
Cholesterol, Total: 199 mg/dL (ref 100–199)
HDL: 40 mg/dL (ref >39)
LDL Chol Calc (NIH): 73 mg/dL (ref 0–99)
Triglycerides: 547 mg/dL — ABNORMAL HIGH (ref 0–149)
VLDL Cholesterol Cal: 86 mg/dL — ABNORMAL HIGH (ref 5–40)

## 2020-10-26 LAB — CBC WITH DIFFERENTIAL/PLATELET
Basophils Absolute: 0.1 10*3/uL (ref 0.0–0.2)
Basos: 1 %
EOS (ABSOLUTE): 0.3 10*3/uL (ref 0.0–0.4)
Eos: 4 %
Hematocrit: 42.5 % (ref 37.5–51.0)
Hemoglobin: 13.4 g/dL (ref 13.0–17.7)
Immature Grans (Abs): 0.1 10*3/uL (ref 0.0–0.1)
Immature Granulocytes: 1 %
Lymphocytes Absolute: 1.8 10*3/uL (ref 0.7–3.1)
Lymphs: 23 %
MCH: 27.1 pg (ref 26.6–33.0)
MCHC: 31.5 g/dL (ref 31.5–35.7)
MCV: 86 fL (ref 79–97)
Monocytes Absolute: 0.9 10*3/uL (ref 0.1–0.9)
Monocytes: 12 %
Neutrophils Absolute: 4.6 10*3/uL (ref 1.4–7.0)
Neutrophils: 59 %
Platelets: 332 10*3/uL (ref 150–450)
RBC: 4.94 x10E6/uL (ref 4.14–5.80)
RDW: 14.1 % (ref 11.6–15.4)
WBC: 7.7 10*3/uL (ref 3.4–10.8)

## 2020-10-26 LAB — BAYER DCA HB A1C WAIVED: HB A1C (BAYER DCA - WAIVED): 7.4 % — ABNORMAL HIGH (ref <7.0)

## 2020-10-26 LAB — URIC ACID: Uric Acid: 8.3 mg/dL (ref 3.8–8.4)

## 2020-10-26 LAB — PSA: Prostate Specific Ag, Serum: <0.1 ng/mL (ref 0.0–4.0)

## 2020-10-26 LAB — TSH: TSH: 2.26 u[IU]/mL (ref 0.450–4.500)

## 2020-11-02 LAB — POCT INR: INR: 2.8 (ref 2.0–3.0)

## 2020-11-09 ENCOUNTER — Other Ambulatory Visit: Payer: Self-pay

## 2020-11-09 ENCOUNTER — Other Ambulatory Visit: Payer: Medicare Other

## 2020-11-09 ENCOUNTER — Telehealth: Payer: Self-pay

## 2020-11-09 DIAGNOSIS — I10 Essential (primary) hypertension: Secondary | ICD-10-CM | POA: Diagnosis not present

## 2020-11-09 DIAGNOSIS — N289 Disorder of kidney and ureter, unspecified: Secondary | ICD-10-CM | POA: Diagnosis not present

## 2020-11-09 DIAGNOSIS — E119 Type 2 diabetes mellitus without complications: Secondary | ICD-10-CM | POA: Diagnosis not present

## 2020-11-09 LAB — URINALYSIS, ROUTINE W REFLEX MICROSCOPIC
Bilirubin, UA: NEGATIVE
Ketones, UA: NEGATIVE
Leukocytes,UA: NEGATIVE
Nitrite, UA: NEGATIVE
Protein,UA: NEGATIVE
RBC, UA: NEGATIVE
Specific Gravity, UA: 1.02 (ref 1.005–1.030)
Urobilinogen, Ur: 0.2 mg/dL (ref 0.2–1.0)
pH, UA: 5.5 (ref 5.0–7.5)

## 2020-11-09 LAB — MICROALBUMIN, URINE WAIVED
Creatinine, Urine Waived: 300 mg/dL (ref 10–300)
Microalb, Ur Waived: 10 mg/L (ref 0–19)
Microalb/Creat Ratio: <30 mg/g (ref <30)

## 2020-11-09 NOTE — Telephone Encounter (Signed)
Pt brought in jury duty form to be filled last apt on 10/25/2020 form left in bin to be reviewed pt would like a call when done.

## 2020-11-09 NOTE — Telephone Encounter (Signed)
Placed in your folder for signature 

## 2020-11-10 LAB — BASIC METABOLIC PANEL
BUN/Creatinine Ratio: 30 — ABNORMAL HIGH (ref 10–24)
BUN: 46 mg/dL — ABNORMAL HIGH (ref 8–27)
CO2: 21 mmol/L (ref 20–29)
Calcium: 10 mg/dL (ref 8.6–10.2)
Chloride: 101 mmol/L (ref 96–106)
Creatinine, Ser: 1.55 mg/dL — ABNORMAL HIGH (ref 0.76–1.27)
Glucose: 144 mg/dL — ABNORMAL HIGH (ref 65–99)
Potassium: 5.2 mmol/L (ref 3.5–5.2)
Sodium: 135 mmol/L (ref 134–144)
eGFR: 49 mL/min/{1.73_m2} — ABNORMAL LOW (ref >59)

## 2020-11-11 DIAGNOSIS — Z7901 Long term (current) use of anticoagulants: Secondary | ICD-10-CM | POA: Diagnosis not present

## 2020-11-11 DIAGNOSIS — Z952 Presence of prosthetic heart valve: Secondary | ICD-10-CM | POA: Diagnosis not present

## 2020-11-14 ENCOUNTER — Other Ambulatory Visit: Payer: Self-pay | Admitting: Family Medicine

## 2020-11-14 DIAGNOSIS — N1831 Chronic kidney disease, stage 3a: Secondary | ICD-10-CM

## 2020-11-17 ENCOUNTER — Telehealth: Payer: Self-pay

## 2020-11-17 NOTE — Telephone Encounter (Signed)
error 

## 2020-11-17 NOTE — Telephone Encounter (Signed)
Form that was dropped off for providers signature does not need providers signature. Provider will write letter for patient and will be contacted when ready for pick up. Please give back patient form that was dropped off, will place In Complete bin.

## 2020-11-17 NOTE — Telephone Encounter (Signed)
Patient requesting letter to excuse for Jury Duty for Wednesday December 08, 2020 at 8:30 am

## 2020-11-18 ENCOUNTER — Telehealth: Payer: Self-pay

## 2020-11-18 ENCOUNTER — Encounter: Payer: Self-pay | Admitting: Family Medicine

## 2020-11-18 NOTE — Telephone Encounter (Signed)
error 

## 2020-11-18 NOTE — Telephone Encounter (Signed)
Patient aware of letter being ready, will pick up tomorrow. Letter attached to form that was dropped off by patient.

## 2020-11-18 NOTE — Telephone Encounter (Signed)
Note on chart.

## 2020-11-22 LAB — POCT INR: INR: 3 (ref 2.0–3.0)

## 2020-12-01 ENCOUNTER — Ambulatory Visit: Payer: Medicare Other | Admitting: Internal Medicine

## 2020-12-02 ENCOUNTER — Other Ambulatory Visit: Payer: Self-pay | Admitting: Nephrology

## 2020-12-02 ENCOUNTER — Other Ambulatory Visit: Payer: Self-pay | Admitting: Family

## 2020-12-02 ENCOUNTER — Other Ambulatory Visit: Payer: Self-pay | Admitting: Internal Medicine

## 2020-12-02 ENCOUNTER — Other Ambulatory Visit: Payer: Self-pay | Admitting: Family Medicine

## 2020-12-02 DIAGNOSIS — N1831 Chronic kidney disease, stage 3a: Secondary | ICD-10-CM | POA: Diagnosis not present

## 2020-12-02 DIAGNOSIS — E1122 Type 2 diabetes mellitus with diabetic chronic kidney disease: Secondary | ICD-10-CM

## 2020-12-02 DIAGNOSIS — I10 Essential (primary) hypertension: Secondary | ICD-10-CM | POA: Diagnosis not present

## 2020-12-03 ENCOUNTER — Encounter: Payer: Medicare Other | Admitting: Physician Assistant

## 2020-12-03 ENCOUNTER — Encounter: Payer: Self-pay | Admitting: Physician Assistant

## 2020-12-03 ENCOUNTER — Telehealth: Payer: Self-pay

## 2020-12-03 ENCOUNTER — Other Ambulatory Visit: Payer: Self-pay

## 2020-12-03 ENCOUNTER — Telehealth: Payer: Self-pay | Admitting: Physician Assistant

## 2020-12-03 DIAGNOSIS — I5022 Chronic systolic (congestive) heart failure: Secondary | ICD-10-CM

## 2020-12-03 MED ORDER — SPIRONOLACTONE 25 MG PO TABS: 1.0000 | ORAL_TABLET | Freq: Every day | ORAL | 3 refills | Status: DC

## 2020-12-03 MED ORDER — LISINOPRIL 5 MG PO TABS: 5.0000 mg | ORAL_TABLET | Freq: Two times a day (BID) | ORAL | 3 refills | Status: DC

## 2020-12-03 MED ORDER — POTASSIUM CHLORIDE CRYS ER 20 MEQ PO TBCR: 20.0000 meq | EXTENDED_RELEASE_TABLET | Freq: Every day | ORAL | 3 refills | Status: DC

## 2020-12-03 MED ORDER — DAPAGLIFLOZIN PROPANEDIOL 10 MG PO TABS: 10.0000 mg | ORAL_TABLET | Freq: Every day | ORAL | 3 refills | Status: DC

## 2020-12-03 MED ORDER — ROSUVASTATIN CALCIUM 40 MG PO TABS: 40.0000 mg | ORAL_TABLET | Freq: Every day | ORAL | 3 refills | Status: DC

## 2020-12-03 MED ORDER — METOPROLOL SUCCINATE ER 100 MG PO TB24: ORAL_TABLET | ORAL | 3 refills | Status: DC

## 2020-12-03 MED ORDER — FUROSEMIDE 40 MG PO TABS
40.0000 mg | ORAL_TABLET | Freq: Two times a day (BID) | ORAL | 3 refills | Status: DC
Start: 2020-12-03 — End: 2021-04-12

## 2020-12-03 NOTE — Telephone Encounter (Signed)
Attempted to call pt. No answer. Lmtcb.  Also left detailed message, ok per DPR, due to report of chest pain.  Offered pt an appointment today at 1:30 with another provider (Cadence Kingston, [place]). Also asked pt to call back to discuss symptoms further.

## 2020-12-03 NOTE — Telephone Encounter (Signed)
Requested Prescriptions   Pending Prescriptions Disp Refills  . metoprolol succinate (TOPROL-XL) 100 MG 24 hr tablet 90 tablet 3    Sig: TAKE 1 TABLET BY MOUTH ONCE DAILY WITH  OR  IMMEDIATELY  FOLLOWING  A  MEAL   Signed Prescriptions Disp Refills  . spironolactone (ALDACTONE) 25 MG tablet 90 tablet 3    Sig: Take 1 tablet (25 mg total) by mouth daily.    Authorizing Provider: END, CHRISTOPHER    Ordering User: Janan Ridge rosuvastatin (CRESTOR) 40 MG tablet 90 tablet 3    Sig: Take 1 tablet (40 mg total) by mouth daily.    Authorizing Provider: END, CHRISTOPHER    Ordering User: Janan Ridge potassium chloride SA (KLOR-CON) 20 MEQ tablet 90 tablet 3    Sig: Take 1 tablet (20 mEq total) by mouth daily.    Authorizing Provider: END, CHRISTOPHER    Ordering User: Janan Ridge lisinopril (ZESTRIL) 5 MG tablet 180 tablet 3    Sig: Take 1 tablet (5 mg total) by mouth 2 (two) times daily.    Authorizing Provider: END, CHRISTOPHER    Ordering User: Janan Ridge furosemide (LASIX) 40 MG tablet 180 tablet 3    Sig: Take 1 tablet (40 mg total) by mouth in the morning and at bedtime.    Authorizing Provider: END, CHRISTOPHER    Ordering User: Janan Ridge dapagliflozin propanediol (FARXIGA) 10 MG TABS tablet 90 tablet 3    Sig: Take 1 tablet (10 mg total) by mouth daily.    Authorizing Provider: END, CHRISTOPHER    Ordering User: Janan Ridge

## 2020-12-03 NOTE — Telephone Encounter (Signed)
Pt c/o of Chest Pain: STAT if CP now or developed within 24 hours  1. Are you having CP right now? Light pain, but states it is due to stress from having to wait in our office today. Patient walked out due to log wait 2. Are you experiencing any other symptoms (ex. SOB, nausea, vomiting, sweating)? No other symptoms  3. How long have you been experiencing CP? Just today, when he was waiting so long in our office  4. Is your CP continuous or coming and going? Continual, but is relaxing and is easing off.  5. Have you taken Nitroglycerin? no ?

## 2020-12-03 NOTE — Telephone Encounter (Signed)
Requested Prescriptions   Pending Prescriptions Disp Refills   metoprolol succinate (TOPROL-XL) 100 MG 24 hr tablet 90 tablet 3    Sig: TAKE 1 TABLET BY MOUTH ONCE DAILY WITH  OR  IMMEDIATELY  FOLLOWING  A  MEAL   Signed Prescriptions Disp Refills   spironolactone (ALDACTONE) 25 MG tablet 90 tablet 3    Sig: Take 1 tablet (25 mg total) by mouth daily.    Authorizing Provider: END, CHRISTOPHER    Ordering User: Janan Ridge   rosuvastatin (CRESTOR) 40 MG tablet 90 tablet 3    Sig: Take 1 tablet (40 mg total) by mouth daily.    Authorizing Provider: END, CHRISTOPHER    Ordering User: Janan Ridge   potassium chloride SA (KLOR-CON) 20 MEQ tablet 90 tablet 3    Sig: Take 1 tablet (20 mEq total) by mouth daily.    Authorizing Provider: END, CHRISTOPHER    Ordering User: Janan Ridge   lisinopril (ZESTRIL) 5 MG tablet 180 tablet 3    Sig: Take 1 tablet (5 mg total) by mouth 2 (two) times daily.    Authorizing Provider: END, CHRISTOPHER    Ordering User: Janan Ridge   furosemide (LASIX) 40 MG tablet 180 tablet 3    Sig: Take 1 tablet (40 mg total) by mouth in the morning and at bedtime.    Authorizing Provider: END, CHRISTOPHER    Ordering User: Janan Ridge   dapagliflozin propanediol (FARXIGA) 10 MG TABS tablet 90 tablet 3    Sig: Take 1 tablet (10 mg total) by mouth daily.    Authorizing Provider: END, CHRISTOPHER    Ordering User: Janan Ridge

## 2020-12-03 NOTE — Progress Notes (Signed)
ERR

## 2020-12-03 NOTE — Telephone Encounter (Signed)
Patient would like all his heart medications refilled.

## 2020-12-08 ENCOUNTER — Other Ambulatory Visit: Payer: Self-pay

## 2020-12-08 ENCOUNTER — Encounter: Payer: Self-pay | Admitting: Internal Medicine

## 2020-12-08 ENCOUNTER — Ambulatory Visit (INDEPENDENT_AMBULATORY_CARE_PROVIDER_SITE_OTHER): Payer: Medicare Other | Admitting: Internal Medicine

## 2020-12-08 VITALS — BP 90/61 | HR 79 | Ht 66.0 in | Wt 341.0 lb

## 2020-12-08 DIAGNOSIS — Z952 Presence of prosthetic heart valve: Secondary | ICD-10-CM

## 2020-12-08 DIAGNOSIS — N1831 Chronic kidney disease, stage 3a: Secondary | ICD-10-CM

## 2020-12-08 DIAGNOSIS — I712 Thoracic aortic aneurysm, without rupture, unspecified: Secondary | ICD-10-CM

## 2020-12-08 DIAGNOSIS — E785 Hyperlipidemia, unspecified: Secondary | ICD-10-CM | POA: Diagnosis not present

## 2020-12-08 DIAGNOSIS — E1169 Type 2 diabetes mellitus with other specified complication: Secondary | ICD-10-CM

## 2020-12-08 DIAGNOSIS — I5022 Chronic systolic (congestive) heart failure: Secondary | ICD-10-CM | POA: Diagnosis not present

## 2020-12-08 DIAGNOSIS — I2510 Atherosclerotic heart disease of native coronary artery without angina pectoris: Secondary | ICD-10-CM

## 2020-12-08 MED ORDER — LISINOPRIL 5 MG PO TABS: 5.0000 mg | ORAL_TABLET | Freq: Every day | ORAL | Status: DC

## 2020-12-08 NOTE — Patient Instructions (Signed)
Medication Instructions:  Your physician has recommended you make the following change in your medication:   DECREASE Lisinopril to 5 mg daily.   Try taking over the counter Tums or Maalox with Pepcid to see if it helps.    *If you need a refill on your cardiac medications before your next appointment, please call your pharmacy*   Lab Work: None ordered If you have labs (blood work) drawn today and your tests are completely normal, you will receive your results only by: Marland Kitchen MyChart Message (if you have MyChart) OR . A paper copy in the mail If you have any lab test that is abnormal or we need to change your treatment, we will call you to review the results.   Testing/Procedures: None ordered   Follow-Up: At [facility], you and your health needs are our priority.  As part of our continuing mission to provide you with exceptional heart care, we have created designated Provider Care Teams.  These Care Teams include your primary Cardiologist (physician) and Advanced Practice Providers (APPs -  Physician Assistants and Nurse Practitioners) who all work together to provide you with the care you need, when you need it.  We recommend signing up for the patient portal called "MyChart".  Sign up information is provided on this After Visit Summary.  MyChart is used to connect with patients for Virtual Visits (Telemedicine).  Patients are able to view lab/test results, encounter notes, upcoming appointments, etc.  Non-urgent messages can be sent to your provider as well.   To learn more about what you can do with MyChart, go to [email or web address].    Your next appointment:   6 week(s)  The format for your next appointment:   In Person  Provider:   You may see Nelva Bush, MD or one of the following Advanced Practice Providers on your designated Care Team:    Murray Hodgkins, NP  Christell Faith, [name]  Marrianne Mood, [name]  Cadence Kathlen Mody, [place]  Laurann Montana,  NP    Other Instructions Please report to the emergency department for reoccurrence of chest pain.

## 2020-12-08 NOTE — Progress Notes (Signed)
Follow-up Outpatient Visit Date: 12/08/2020  Primary Care Provider: Valerie Roys, DO [place] [address]  Chief Complaint: Follow-up cardiomyopathy, CAD, and thoracic aortic aneurysm  HPI:  Roberto [name] is a 65 y.o. male with history of mechanical aortic valve replacement, non-obstructive coronary artery disease, thoracic aortic aneurysm (followed by Roberto [name]), stroke, right renal infarct, chronic systolic heart failure (LVEF 40-45%), hyperlipidemia, and morbid obesity, who presents for follow-up of valvular heart disease, cardiomyopathy, and thoracic aortic aneurysm.  He was last [name] in our office in early April by Roberto Montana, NP, at which time he was feeling relatively well.  He admitted to not wearing CPAP regularly due to the mask being uncomfortable.  Roberto [name] was added to his other goal-directed medical therapy for management of his chronic HFrEF.  No other medication changes or additional testing were pursued.  Today, Roberto [name] reports feeling relatively well.  He has experienced occasional feeling of a "sore throat" that extends down into his chest.  He woke up feeling this today.  He denies exertional chest pain as well as sharp chest/back pain.  His exertional dyspnea is unchanged from baseline.  He denies edema, palpitations, and lightheadedness.  Home BP is typically a little higher than today's readings.  Roberto [name] has started going to the gym, where walks on the treadmill for ~15 minutes and does some light resistance exercises.  He recently met with a nephrologist and is still waiting to hear back about lab results.  He is also scheduled for a renal ultrasound later this month.  --------------------------------------------------------------------------------------------------  Past Medical History:  Diagnosis Date   Bicuspid aortic valve    a. s/p #27 [employer]omed[employer] mechanical valve on 03/25/2010; b. on Coumadin; c. TTE 12/17: EF 40-45%, moderately dilated LV  with moderate LVH, AVR well-seated with 14 mmHg gradient, peak AV velocity 2.5 m/s, mild mitral valve thickening with mild MR, mildly dilated RV with mildly reduced contraction   Cellulitis    Chronic systolic CHF (congestive heart failure) ([place])    a. R/LHC 03/2010 showed no significant CAD, LVEDP 31 mmHg, mean AoV gradient 34 mmHg at rest and 47 mmHg with dobutamine 20 mcg/kg/min, AVA 1.0 cm^2, RA 31, RV 68/25, PA 68/47, PCWP 38. PA sat 65%. CO 6.2 L/min (Fick) and 5.3 L/min (thermodilution)   Clotting disorder (HCC)    H/O mechanical aortic valve replacement 03/25/2010   a. #27 Carbomed[employer] mechanical valve   Hypercholesterolemia    Renal infarct [facility]) 2017   Multiple right renal infarcts, likely embolic.   Stroke [facility])    TIA (transient ischemic attack) 05/2014   Past Surgical History:  Procedure Laterality Date   AORTIC VALVE REPLACEMENT     CARDIAC CATHETERIZATION  03/21/2010   No significant CAD. Severe aortic stenosis. Severely elevated left and right heart filling pressures.   CARDIAC SURGERY  2009   CHF   CARPAL TUNNEL RELEASE Left 2005   RIGHT HEART CATH AND CORONARY ANGIOGRAPHY N/A 01/28/2019   Procedure: RIGHT HEART CATH AND CORONARY ANGIOGRAPHY;  Surgeon: Nelva Bush, MD;  Location: [facility];  Service: Cardiovascular;  Laterality: N/A;   TONSILLECTOMY  1962    Current Meds  Medication Sig   albuterol (PROVENTIL) (2.5 MG/3ML) 0.083% nebulizer solution USE 1 VIAL IN NEBULIZER EVERY 6 HOURS AS NEEDED FOR WHEEZING OR  SHORTNESS  OF  BREATH   allopurinol (ZYLOPRIM) 100 MG tablet Take 1 tablet (100 mg total) by mouth daily.   aspirin 81 MG  chewable tablet Chew 81 mg by mouth daily.   cyclobenzaprine (FLEXERIL) 10 MG tablet Take 1 tablet (10 mg total) by mouth 3 times/day as needed-between meals & bedtime for muscle spasms.   dapagliflozin propanediol (FARXIGA) 10 MG TABS tablet Take 1 tablet (10 mg total) by mouth daily.   EQ ALLERGY RELIEF, CETIRIZINE, 10 MG  tablet Take 1 tablet by mouth once daily   fenofibrate (TRICOR) 48 MG tablet TAKE 1 TABLET BY MOUTH ONCE DAILY .   furosemide (LASIX) 40 MG tablet Take 1 tablet (40 mg total) by mouth in the morning and at bedtime.   lisinopril (ZESTRIL) 5 MG tablet Take 1 tablet (5 mg total) by mouth 2 (two) times daily.   metFORMIN (GLUCOPHAGE XR) 500 MG 24 hr tablet Take 1 tablet (500 mg total) by mouth daily with breakfast.   metoprolol succinate (TOPROL-XL) 100 MG 24 hr tablet TAKE 1 TABLET BY MOUTH ONCE DAILY WITH  OR  IMMEDIATELY  FOLLOWING  A  MEAL   nortriptyline (PAMELOR) 10 MG capsule Take 1 capsule (10 mg total) by mouth at bedtime.   Omega-3 Fatty Acids (FISH OIL) 1000 MG CAPS Take 5,000 mg by mouth daily.   potassium chloride SA (KLOR-CON) 20 MEQ tablet Take 1 tablet (20 mEq total) by mouth daily.   rosuvastatin (CRESTOR) 40 MG tablet Take 1 tablet (40 mg total) by mouth daily.   spironolactone (ALDACTONE) 25 MG tablet Take 1 tablet (25 mg total) by mouth daily.   VENTOLIN HFA 108 (90 Base) MCG/ACT inhaler INHALE 2 PUFFS BY MOUTH EVERY 6 HOURS AS NEEDED FOR WHEEZING OR SHORTNESS OF BREATH   warfarin (COUMADIN) 4 MG tablet TAKE 1 TABLET BY MOUTH ONCE DAILY AT  6PM  AS  DIRECTED   warfarin (COUMADIN) 7.5 MG tablet Take 1 tablet (7.5 mg total) by mouth daily. 4 mg for 2 days, 7.5 mg on the 3rd day, and repeat    Allergies: Patient has no known allergies.  Social History   Tobacco Use   Smoking status: Former Smoker    Packs/day: 0.50    Years: 46.00    Pack years: 23.00    Types: Cigarettes    Start date: 07/16/2017    Quit date: 05/11/2019    Years since quitting: 1.5   Smokeless tobacco: Never Used  Vaping Use   Vaping Use: Never used  Substance Use Topics   Alcohol use: No    Alcohol/week: 0.0 standard drinks   Drug use: No    Family History  Problem Relation Age of Onset   Arthritis Mother    Dementia Mother    Colon cancer Mother    Arthritis Father    Diabetes Father     Stroke Father    Colon cancer Father    Heart attack Brother    Breast cancer Sister    Seizures Sister    Cancer Brother        brain   Heart disease Brother    Heart attack Brother     Review of Systems: A 12-system review of systems was performed and was negative except as noted in the HPI.  --------------------------------------------------------------------------------------------------  Physical Exam: BP 90/61 (BP Location: Left Arm, Patient Position: Sitting, Cuff Size: Large)   Pulse 91   Ht $R'5\' 6"'ec$  (1.676 m)   Wt (!) 341 lb (154.7 kg)   SpO2 96%   BMI 55.04 kg/m   Repeat BP: Right: 100/68 Left: 100/70  General:  NAD. Neck: No JVD  or HJR. Lungs: Clear to auscultation bilaterally without wheezes or crackles. Heart: Regular rate and rhythm with occasional extrasystoles and mechanical S2.  No murmurs, rubs, or gallops. Abdomen: Obese but soft and non-tender. Extremities: No lower extremity edema.  EKG:  NSR with PVC's, incomplete LBBB, and nonspecific ST/T changes.  PVC's are new since 08/26/20 (but have been present on more remote tracings).  Otherwise, no significant change.  Lab Results  Component Value Date   WBC 7.7 10/25/2020   HGB 13.4 10/25/2020   HCT 42.5 10/25/2020   MCV 86 10/25/2020   PLT 332 10/25/2020    Lab Results  Component Value Date   NA 135 11/09/2020   K 5.2 11/09/2020   CL 101 11/09/2020   CO2 21 11/09/2020   BUN 46 (H) 11/09/2020   CREATININE 1.55 (H) 11/09/2020   GLUCOSE 144 (H) 11/09/2020   ALT 25 10/25/2020    Lab Results  Component Value Date   CHOL 199 10/25/2020   HDL 40 10/25/2020   LDLCALC 73 10/25/2020   LDLDIRECT 96.4 06/04/2019   TRIG 547 (H) 10/25/2020   CHOLHDL 4.3 08/08/2019    --------------------------------------------------------------------------------------------------  ASSESSMENT AND PLAN: Coronary artery disease: No angina reported.  Vague "sore throat" sensation radiating into the upper chest is  not characteristic of angina and is infrequent.  EKG today is stable other than PVC's that are present today.  We will continue current medications to prevent progression of disease.  I have encouraged Mr. Holderman to work on weight loss through diet and exercise.  I suggested that he try using OTC antacids to see if they help with his vague discomfort.  Chronic HFrEF: Mr. Binns appears grossly euvolemic though body habitus makes exam challenging.  Due to his borderline low BP today, I have recommended decreasing lisinopril to 5 mg daily (from BID).  He should continue metoprolol succinate 100 mg daily, dapagliflozin, and spironolactone 25 mg daily.  Thoracic aortic aneurysm: Vague "sore throat" is not consistent with acute aortic syndrome, though his severely dilated places him at risk for this.  BP is equal in both arms.  We will decrease lisinopril, as above, but otherwise continue his current medications.  I advised him to avoid heavy lifting.  He should continue follow-up with Roberto [name] this summer.  I advised him to call 911 for any worsening chest pain, new back pain, or neurologic changes.  Chronic kidney disease stage 3: Decrease lisinopril in the setting of soft blood pressure.  Continue to avoid nephrotoxic drugs, with ongoing workup per nephrology.  History of aortic valve replacement: Continue aspirin and warfarin as well as SBE prophylaxis.  Hyperlipidemia associated with type 2 diabetes mellitus: Continue rosuvastatin 40 mg daily, fenofibrate, and fish oil as well as lifestyle modifications.  Ongoing management of diabetes per PCP.  Morbid obesity: I encouraged Mr. Landrigan to continue working on weight loss through diet and exercise.  Follow-up: Return to clinic in 6 weeks.  Nelva Bush, MD 12/08/2020 2:04 PM

## 2020-12-09 ENCOUNTER — Encounter: Payer: Self-pay | Admitting: Internal Medicine

## 2020-12-09 DIAGNOSIS — N1831 Chronic kidney disease, stage 3a: Secondary | ICD-10-CM | POA: Insufficient documentation

## 2020-12-09 DIAGNOSIS — E1169 Type 2 diabetes mellitus with other specified complication: Secondary | ICD-10-CM | POA: Insufficient documentation

## 2020-12-20 ENCOUNTER — Other Ambulatory Visit: Payer: Self-pay

## 2020-12-20 ENCOUNTER — Ambulatory Visit
Admission: RE | Admit: 2020-12-20 | Discharge: 2020-12-20 | Disposition: A | Discharge status: Home / Self Care | Payer: Medicare Other | Source: Ambulatory Visit | Attending: Nephrology | Admitting: Nephrology

## 2020-12-20 DIAGNOSIS — Z7901 Long term (current) use of anticoagulants: Secondary | ICD-10-CM | POA: Diagnosis not present

## 2020-12-20 DIAGNOSIS — Z952 Presence of prosthetic heart valve: Secondary | ICD-10-CM | POA: Diagnosis not present

## 2020-12-20 DIAGNOSIS — E1122 Type 2 diabetes mellitus with diabetic chronic kidney disease: Secondary | ICD-10-CM | POA: Diagnosis not present

## 2020-12-20 DIAGNOSIS — N183 Chronic kidney disease, stage 3 unspecified: Secondary | ICD-10-CM | POA: Diagnosis not present

## 2020-12-20 DIAGNOSIS — N1831 Chronic kidney disease, stage 3a: Secondary | ICD-10-CM | POA: Insufficient documentation

## 2020-12-20 LAB — PROTIME-INR: INR: 2.4 — AB (ref 0.9–1.1)

## 2020-12-30 LAB — PROTIME-INR: INR: 2.4 — AB (ref 0.9–1.1)

## 2021-01-08 ENCOUNTER — Encounter: Payer: Self-pay | Admitting: Family Medicine

## 2021-01-08 LAB — POCT INR: INR: 2.7 (ref 2.0–3.0)

## 2021-01-16 ENCOUNTER — Encounter: Payer: Self-pay | Admitting: Family Medicine

## 2021-01-16 LAB — POCT INR: INR: 2.6 (ref 2.0–3.0)

## 2021-01-18 NOTE — Progress Notes (Signed)
Follow-up Outpatient Visit Date: 01/19/2021  Primary Care Provider: Valerie Roys, DO [place] [address]  Chief Complaint: Follow-up heart failure, valvular heart disease, and aortic aneurysm  HPI:  Roberto [name] is a 65 y.o. male with history of mechanical aortic valve replacement, non-obstructive coronary artery disease, thoracic aortic aneurysm (followed by Dr. Orvan Seen), stroke, right renal infarct, chronic systolic heart failure (LVEF 40-45%), hyperlipidemia, obstructive sleep apnea not using CPAP, and morbid obesity, who presents for follow-up of valvular heart disease, cardiomyopathy, and thoracic aortic aneurysm.  I last saw him in early June, at which time he reported intermittent discomfort in his throat extending down into his chest.  I recommended trial of an over-the-counter antacid.  Mr. Gopal reports that the discomfort resolved spontaneously; he did not try any antacids.  Today, Roberto [name] reports feeling well with improving exercise tolerance and less shortness of breath with continued exercise.  He is currently going to the gym 4 days a week, doing light weights as well as cardio exercises.  He denies chest pain, palpitations, lightheadedness, and edema.  He has not been using his CPAP machine but is willing to try it.  He will reach out to his medical equipment supplier to inquire about a nasal pillow rather than a full facemask, which has been uncomfortable for him in the past.  --------------------------------------------------------------------------------------------------  Past Medical History:  Diagnosis Date   Bicuspid aortic valve    a. s/p #27 [employer] mechanical valve on 03/25/2010; b. on Coumadin; c. TTE 12/17: EF 40-45%, moderately dilated LV with moderate LVH, AVR well-seated with 14 mmHg gradient, peak AV velocity 2.5 m/s, mild mitral valve thickening with mild MR, mildly dilated RV with mildly reduced contraction   Cellulitis    Chronic systolic  CHF (congestive heart failure) ([place])    a. R/LHC 03/2010 showed no significant CAD, LVEDP 31 mmHg, mean AoV gradient 34 mmHg at rest and 47 mmHg with dobutamine 20 mcg/kg/min, AVA 1.0 cm^2, RA 31, RV 68/25, PA 68/47, PCWP 38. PA sat 65%. CO 6.2 L/min (Fick) and 5.3 L/min (thermodilution)   Clotting disorder (HCC)    H/O mechanical aortic valve replacement 03/25/2010   a. #27 [employer] mechanical valve   Hypercholesterolemia    Renal infarct [facility]) 2017   Multiple right renal infarcts, likely embolic.   Stroke [facility])    TIA (transient ischemic attack) 05/2014   Past Surgical History:  Procedure Laterality Date   AORTIC VALVE REPLACEMENT     CARDIAC CATHETERIZATION  03/21/2010   No significant CAD. Severe aortic stenosis. Severely elevated left and right heart filling pressures.   CARDIAC SURGERY  2009   CHF   CARPAL TUNNEL RELEASE Left 2005   RIGHT HEART CATH AND CORONARY ANGIOGRAPHY N/A 01/28/2019   Procedure: RIGHT HEART CATH AND CORONARY ANGIOGRAPHY;  Surgeon: Nelva Bush, MD;  Location: [place];  Service: Cardiovascular;  Laterality: N/A;   TONSILLECTOMY  1962    Current Meds  Medication Sig   albuterol (PROVENTIL) (2.5 MG/3ML) 0.083% nebulizer solution USE 1 VIAL IN NEBULIZER EVERY 6 HOURS AS NEEDED FOR WHEEZING OR  SHORTNESS  OF  BREATH   allopurinol (ZYLOPRIM) 100 MG tablet Take 1 tablet (100 mg total) by mouth daily.   aspirin 81 MG chewable tablet Chew 81 mg by mouth daily.   cyclobenzaprine (FLEXERIL) 10 MG tablet Take 1 tablet (10 mg total) by mouth 3 times/day as needed-between meals & bedtime for muscle spasms.   dapagliflozin propanediol (  FARXIGA) 10 MG TABS tablet Take 1 tablet (10 mg total) by mouth daily.   EQ ALLERGY RELIEF, CETIRIZINE, 10 MG tablet Take 1 tablet by mouth once daily   fenofibrate (TRICOR) 48 MG tablet TAKE 1 TABLET BY MOUTH ONCE DAILY .   furosemide (LASIX) 40 MG tablet Take 1 tablet (40 mg total) by mouth in the morning and at  bedtime.   lisinopril (ZESTRIL) 5 MG tablet Take 1 tablet (5 mg total) by mouth daily.   metFORMIN (GLUCOPHAGE XR) 500 MG 24 hr tablet Take 1 tablet (500 mg total) by mouth daily with breakfast.   metoprolol succinate (TOPROL-XL) 100 MG 24 hr tablet TAKE 1 TABLET BY MOUTH ONCE DAILY WITH  OR  IMMEDIATELY  FOLLOWING  A  MEAL   nortriptyline (PAMELOR) 10 MG capsule Take 1 capsule (10 mg total) by mouth at bedtime.   Omega-3 Fatty Acids (FISH OIL) 1000 MG CAPS Take 5,000 mg by mouth daily.   potassium chloride SA (KLOR-CON) 20 MEQ tablet Take 1 tablet (20 mEq total) by mouth daily.   rosuvastatin (CRESTOR) 40 MG tablet Take 1 tablet (40 mg total) by mouth daily.   spironolactone (ALDACTONE) 25 MG tablet Take 1 tablet (25 mg total) by mouth daily.   VENTOLIN HFA 108 (90 Base) MCG/ACT inhaler INHALE 2 PUFFS BY MOUTH EVERY 6 HOURS AS NEEDED FOR WHEEZING OR SHORTNESS OF BREATH   warfarin (COUMADIN) 4 MG tablet TAKE 1 TABLET BY MOUTH ONCE DAILY AT  6PM  AS  DIRECTED   warfarin (COUMADIN) 7.5 MG tablet Take 1 tablet (7.5 mg total) by mouth daily. 4 mg for 2 days, 7.5 mg on the 3rd day, and repeat    Allergies: Patient has no known allergies.  Social History   Tobacco Use   Smoking status: Former    Packs/day: 0.50    Years: 46.00    Pack years: 23.00    Types: Cigarettes    Start date: 07/16/2017    Quit date: 05/11/2019    Years since quitting: 1.6   Smokeless tobacco: Never  Vaping Use   Vaping Use: Never used  Substance Use Topics   Alcohol use: No    Alcohol/week: 0.0 standard drinks   Drug use: No    Family History  Problem Relation Age of Onset   Arthritis Mother    Dementia Mother    Colon cancer Mother    Arthritis Father    Diabetes Father    Stroke Father    Colon cancer Father    Heart attack Brother    Breast cancer Sister    Seizures Sister    Cancer Brother        brain   Heart disease Brother    Heart attack Brother     Review of Systems: A 12-system review  of systems was performed and was negative except as noted in the HPI.  --------------------------------------------------------------------------------------------------  Physical Exam: BP 100/76 (BP Location: Left Arm, Patient Position: Sitting, Cuff Size: Large)   Pulse 85   Ht 5\' 6"  (1.676 m)   Wt (!) 332 lb (150.6 kg)   SpO2 96%   BMI 53.59 kg/m   General:  NAD. Neck: No JVD or HJR, though body habitus limits evaluation. Lungs: Clear to auscultation bilaterally without wheezes or crackles. Heart: Regular rate and rhythm with mechanical S2 and 1/6 systolic murmur. Abdomen: Soft, nontender, nondistended. Extremities: No lower extremity edema.   Lab Results  Component Value Date   WBC 7.7  10/25/2020   HGB 13.4 10/25/2020   HCT 42.5 10/25/2020   MCV 86 10/25/2020   PLT 332 10/25/2020    Lab Results  Component Value Date   NA 135 11/09/2020   K 5.2 11/09/2020   CL 101 11/09/2020   CO2 21 11/09/2020   BUN 46 (H) 11/09/2020   CREATININE 1.55 (H) 11/09/2020   GLUCOSE 144 (H) 11/09/2020   ALT 25 10/25/2020    Lab Results  Component Value Date   CHOL 199 10/25/2020   HDL 40 10/25/2020   LDLCALC 73 10/25/2020   LDLDIRECT 96.4 06/04/2019   TRIG 547 (H) 10/25/2020   CHOLHDL 4.3 08/08/2019    --------------------------------------------------------------------------------------------------  ASSESSMENT AND PLAN: Chronic HFrEF: Mr. Charyl Bigger appears euvolemic on exam and reports improving functional capacity with regular exercise.  He now has NYHA class II symptoms.  His borderline low blood pressure precludes further escalation of his goal-directed medical therapy, consisting of Farxiga, lisinopril, metoprolol succinate, and spironolactone.  Continue furosemide 40 mg twice daily.  Coronary artery disease: No angina reported.  Continue medical therapy to prevent progression of disease.  Status post AVR: No clinical evidence of valve dysfunction.  Continue indefinite  anticoagulation and aspirin as well as SBE prophylaxis.  Thoracic aortic aneurysm: No symptoms reported.  Patient due for annual follow-up with Dr. Julien Girt in September, including CTA of the chest.  I have encouraged him to reach out to [facility] to schedule this.  Chronic kidney disease stage III: Stable renal function.  Continue current medications with avoidance of nephrotoxic drugs.  Continue follow-up with nephrology.  Obstructive sleep apnea: Mr. Blackerby has not been using his CPAP regularly.  I encouraged him to restart this and to reach out to his medical supply company if he is having issues with his equipment.  Morbid obesity: I congratulated Mr. Gomes on his 9 pound weight loss since June.  I encouraged him to continue working on diet and exercise as his BMI remains greater than 50.  Follow-up: Return to clinic in 3 months.  Nelva Bush, MD 01/19/2021 11:01 AM

## 2021-01-19 ENCOUNTER — Other Ambulatory Visit: Payer: Self-pay

## 2021-01-19 ENCOUNTER — Ambulatory Visit: Payer: Medicare Other | Admitting: Internal Medicine

## 2021-01-19 ENCOUNTER — Ambulatory Visit (INDEPENDENT_AMBULATORY_CARE_PROVIDER_SITE_OTHER): Payer: Medicare Other | Admitting: Internal Medicine

## 2021-01-19 ENCOUNTER — Encounter: Payer: Self-pay | Admitting: Internal Medicine

## 2021-01-19 VITALS — BP 100/76 | HR 85 | Ht 66.0 in | Wt 332.0 lb

## 2021-01-19 DIAGNOSIS — N1831 Chronic kidney disease, stage 3a: Secondary | ICD-10-CM | POA: Diagnosis not present

## 2021-01-19 DIAGNOSIS — I5022 Chronic systolic (congestive) heart failure: Secondary | ICD-10-CM

## 2021-01-19 DIAGNOSIS — Z952 Presence of prosthetic heart valve: Secondary | ICD-10-CM

## 2021-01-19 DIAGNOSIS — I2510 Atherosclerotic heart disease of native coronary artery without angina pectoris: Secondary | ICD-10-CM | POA: Diagnosis not present

## 2021-01-19 DIAGNOSIS — I712 Thoracic aortic aneurysm, without rupture, unspecified: Secondary | ICD-10-CM

## 2021-01-19 DIAGNOSIS — G4733 Obstructive sleep apnea (adult) (pediatric): Secondary | ICD-10-CM | POA: Diagnosis not present

## 2021-01-19 NOTE — Patient Instructions (Signed)
Medication Instructions:   Your physician recommends that you continue on your current medications as directed. Please refer to the Current Medication list given to you today.  *If you need a refill on your cardiac medications before your next appointment, please call your pharmacy*   Lab Work:  None ordered  Testing/Procedures:  None ordered   Follow-Up: At [facility], you and your health needs are our priority.  As part of our continuing mission to provide you with exceptional heart care, we have created designated Provider Care Teams.  These Care Teams include your primary Cardiologist (physician) and Advanced Practice Providers (APPs -  Physician Assistants and Nurse Practitioners) who all work together to provide you with the care you need, when you need it.  We recommend signing up for the patient portal called "MyChart".  Sign up information is provided on this After Visit Summary.  MyChart is used to connect with patients for Virtual Visits (Telemedicine).  Patients are able to view lab/test results, encounter notes, upcoming appointments, etc.  Non-urgent messages can be sent to your provider as well.   To learn more about what you can do with MyChart, go to [email or web address].    Your next appointment:   3 month(s)  The format for your next appointment:   In Person  Provider:   You may see Nelva Bush, MD or one of the following Advanced Practice Providers on your designated Care Team:   Murray Hodgkins, NP Christell Faith, [name] Marrianne Mood, [name] Cadence Kathlen Mody, [place]   Other Instructions  Please contact Dr. Orvan Seen to schedule follow up that is due in September.

## 2021-01-24 ENCOUNTER — Encounter: Payer: Self-pay | Admitting: Family Medicine

## 2021-01-24 ENCOUNTER — Other Ambulatory Visit: Payer: Self-pay

## 2021-01-24 ENCOUNTER — Ambulatory Visit (INDEPENDENT_AMBULATORY_CARE_PROVIDER_SITE_OTHER): Payer: Medicare Other | Admitting: Family Medicine

## 2021-01-24 ENCOUNTER — Telehealth: Payer: Self-pay

## 2021-01-24 VITALS — BP 98/59 | HR 73 | Temp 98.2°F | Ht 65.0 in | Wt 334.0 lb

## 2021-01-24 DIAGNOSIS — N1831 Chronic kidney disease, stage 3a: Secondary | ICD-10-CM | POA: Diagnosis not present

## 2021-01-24 DIAGNOSIS — I2510 Atherosclerotic heart disease of native coronary artery without angina pectoris: Secondary | ICD-10-CM

## 2021-01-24 DIAGNOSIS — I10 Essential (primary) hypertension: Secondary | ICD-10-CM | POA: Diagnosis not present

## 2021-01-24 DIAGNOSIS — E119 Type 2 diabetes mellitus without complications: Secondary | ICD-10-CM | POA: Diagnosis not present

## 2021-01-24 LAB — BAYER DCA HB A1C WAIVED: HB A1C (BAYER DCA - WAIVED): 6.8 % (ref <7.0)

## 2021-01-24 NOTE — Assessment & Plan Note (Signed)
Rechecking labs today. Await results. Treat as needed.  °

## 2021-01-24 NOTE — Assessment & Plan Note (Signed)
Under good control on current regimen. Continue current regimen. Continue to monitor. Call with any concerns. Refills given. Labs drawn today.   

## 2021-01-24 NOTE — Progress Notes (Signed)
BP (!) 98/59   Pulse 73   Temp 98.2 F (36.8 C) (Oral)   Ht '5\' 5"'$  (1.651 m)   Wt (!) 334 lb (151.5 kg)   SpO2 97%   BMI 55.58 kg/m    Subjective:    Patient ID: Roberto [name], male    DOB: 25-Dec-1955, 64 y.o.   MRN: [id number]  HPI: Roberto [name] is a 65 y.o. male  Chief Complaint  Patient presents with   Diabetes   Hypertension   DIABETES Hypoglycemic episodes:no Polydipsia/polyuria: no Visual disturbance: no Chest pain: no Paresthesias: no Glucose Monitoring: yes  Accucheck frequency: occasionally Taking Insulin?: no Blood Pressure Monitoring: not checking Retinal Examination: Not up to Date Foot Exam: Up to Date Diabetic Education: Completed Pneumovax: Up to Date Influenza: Up to Date Aspirin: no   Relevant past medical, surgical, family and social history reviewed and updated as indicated. Interim medical history since our last visit reviewed. Allergies and medications reviewed and updated.  Review of Systems  Constitutional: Negative.   Respiratory: Negative.    Cardiovascular: Negative.   Gastrointestinal: Negative.   Musculoskeletal: Negative.   Neurological: Negative.   Psychiatric/Behavioral: Negative.     Per HPI unless specifically indicated above     Objective:    BP (!) 98/59   Pulse 73   Temp 98.2 F (36.8 C) (Oral)   Ht '5\' 5"'$  (1.651 m)   Wt (!) 334 lb (151.5 kg)   SpO2 97%   BMI 55.58 kg/m   Wt Readings from Last 3 Encounters:  01/24/21 (!) 334 lb (151.5 kg)  01/19/21 (!) 332 lb (150.6 kg)  12/08/20 (!) 341 lb (154.7 kg)    Physical Exam Vitals and nursing note reviewed.  Constitutional:      General: He is not in acute distress.    Appearance: Normal appearance. He is obese. He is not ill-appearing, toxic-appearing or diaphoretic.  HENT:     Head: Normocephalic and atraumatic.     Right Ear: External ear normal.     Left Ear: External ear normal.     Nose: Nose normal.     Mouth/Throat:     Mouth: Mucous membranes  are moist.     Pharynx: Oropharynx is clear.  Eyes:     General: No scleral icterus.       Right eye: No discharge.        Left eye: No discharge.     Extraocular Movements: Extraocular movements intact.     Conjunctiva/sclera: Conjunctivae normal.     Pupils: Pupils are equal, round, and reactive to light.  Cardiovascular:     Rate and Rhythm: Normal rate and regular rhythm.     Pulses: Normal pulses.     Heart sounds: Normal heart sounds. No murmur heard.   No friction rub. No gallop.  Pulmonary:     Effort: Pulmonary effort is normal. No respiratory distress.     Breath sounds: Normal breath sounds. No stridor. No wheezing, rhonchi or rales.  Chest:     Chest wall: No tenderness.  Musculoskeletal:        General: Normal range of motion.     Cervical back: Normal range of motion and neck supple.  Skin:    General: Skin is warm and dry.     Capillary Refill: Capillary refill takes less than 2 seconds.     Coloration: Skin is not jaundiced or pale.     Findings: No bruising, erythema, lesion or rash.  Neurological:  General: No focal deficit present.     Mental Status: He is alert and oriented to person, place, and time. Mental status is at baseline.  Psychiatric:        Mood and Affect: Mood normal.        Behavior: Behavior normal.        Thought Content: Thought content normal.        Judgment: Judgment normal.    Results for orders placed or performed in visit on 12/30/20  Protime-INR  Result Value Ref Range   INR 2.4 (A) 0.9 - 1.1      Assessment & Plan:   Problem List Items Addressed This Visit       Cardiovascular and Mediastinum   Essential hypertension    Under good control on current regimen. Continue current regimen. Continue to monitor. Call with any concerns. Refills given. Labs drawn today.        Relevant Orders   Basic metabolic panel     Endocrine   Diabetes mellitus type 2, diet-controlled ([place]) - Primary    Doing well with A1c of 6.8.  Continue current regimen. Continue to monitor. Call with any concerns.       Relevant Orders   Bayer DCA Hb A1c Waived   Basic metabolic panel     Genitourinary   Chronic kidney disease, stage 3a ([place])    Rechecking labs today. Await results. Treat as needed.        Relevant Orders   Basic metabolic panel     Follow up plan: Return in about 3 months (around 04/26/2021) for physical.

## 2021-01-24 NOTE — Assessment & Plan Note (Signed)
Doing well with A1c of 6.8. Continue current regimen. Continue to monitor. Call with any concerns.  

## 2021-01-24 NOTE — Telephone Encounter (Signed)
Copied from [place] [id number]. Topic: General - Inquiry >> Jan 24, 2021 10:47 AM Valere Dross wrote: Reason for CRM: Pt friend called in, [name] and wanted to let someone know that they were billed through the medicare, and stated it needed to be and through medicaid.    Called pt to add medicaid card as we did not have on file DPR kim verbalized understanding.

## 2021-01-25 DIAGNOSIS — Z952 Presence of prosthetic heart valve: Secondary | ICD-10-CM | POA: Diagnosis not present

## 2021-01-25 DIAGNOSIS — Z7901 Long term (current) use of anticoagulants: Secondary | ICD-10-CM | POA: Diagnosis not present

## 2021-01-25 LAB — BASIC METABOLIC PANEL
BUN/Creatinine Ratio: 16 (ref 10–24)
BUN: 24 mg/dL (ref 8–27)
CO2: 22 mmol/L (ref 20–29)
Calcium: 9.5 mg/dL (ref 8.6–10.2)
Chloride: 97 mmol/L (ref 96–106)
Creatinine, Ser: 1.49 mg/dL — ABNORMAL HIGH (ref 0.76–1.27)
Glucose: 161 mg/dL — ABNORMAL HIGH (ref 65–99)
Potassium: 4.4 mmol/L (ref 3.5–5.2)
Sodium: 136 mmol/L (ref 134–144)
eGFR: 51 mL/min/{1.73_m2} — ABNORMAL LOW (ref >59)

## 2021-01-28 ENCOUNTER — Encounter: Payer: Self-pay | Admitting: Family Medicine

## 2021-02-02 ENCOUNTER — Other Ambulatory Visit: Payer: Self-pay | Admitting: Family Medicine

## 2021-02-02 NOTE — Telephone Encounter (Signed)
No future OV scheduled. Last ordered on 10/25/20 #90 caps with one refill. Patient should have refill on file with current pharmacy-attempted to reach pharmacy 3x today with no answer.This request should not be needed due to refill on file per chart-refusing request.

## 2021-02-10 ENCOUNTER — Other Ambulatory Visit: Payer: Self-pay | Admitting: Surgery

## 2021-02-10 DIAGNOSIS — I712 Thoracic aortic aneurysm, without rupture, unspecified: Secondary | ICD-10-CM

## 2021-02-14 LAB — POCT INR: INR: 2.4 — AB (ref 0.9–1.1)

## 2021-02-15 ENCOUNTER — Other Ambulatory Visit: Payer: Self-pay

## 2021-03-07 DIAGNOSIS — Z7901 Long term (current) use of anticoagulants: Secondary | ICD-10-CM | POA: Diagnosis not present

## 2021-03-07 DIAGNOSIS — Z952 Presence of prosthetic heart valve: Secondary | ICD-10-CM | POA: Diagnosis not present

## 2021-03-07 LAB — PROTIME-INR: INR: 2.8 — AB (ref 0.9–1.1)

## 2021-03-08 ENCOUNTER — Other Ambulatory Visit: Payer: Self-pay | Admitting: Family

## 2021-03-16 ENCOUNTER — Ambulatory Visit
Admission: RE | Admit: 2021-03-16 | Discharge: 2021-03-16 | Disposition: A | Discharge status: Home / Self Care | Payer: Medicare Other | Source: Ambulatory Visit | Attending: Surgery | Admitting: Surgery

## 2021-03-16 ENCOUNTER — Other Ambulatory Visit: Payer: Self-pay

## 2021-03-16 ENCOUNTER — Telehealth: Payer: Self-pay

## 2021-03-16 ENCOUNTER — Encounter: Payer: Self-pay | Admitting: Surgery

## 2021-03-16 ENCOUNTER — Ambulatory Visit (INDEPENDENT_AMBULATORY_CARE_PROVIDER_SITE_OTHER): Payer: Medicare Other | Admitting: Surgery

## 2021-03-16 VITALS — BP 97/64 | HR 70 | Resp 20 | Ht 65.0 in | Wt 329.0 lb

## 2021-03-16 DIAGNOSIS — I2510 Atherosclerotic heart disease of native coronary artery without angina pectoris: Secondary | ICD-10-CM | POA: Diagnosis not present

## 2021-03-16 DIAGNOSIS — I712 Thoracic aortic aneurysm, without rupture, unspecified: Secondary | ICD-10-CM

## 2021-03-16 DIAGNOSIS — Q231 Congenital insufficiency of aortic valve: Secondary | ICD-10-CM

## 2021-03-16 DIAGNOSIS — R911 Solitary pulmonary nodule: Secondary | ICD-10-CM | POA: Diagnosis not present

## 2021-03-16 DIAGNOSIS — I700 Atherosclerosis of aorta: Secondary | ICD-10-CM | POA: Diagnosis not present

## 2021-03-16 NOTE — Progress Notes (Signed)
HPI:  The patient is a 65 year old gentleman with a history of mechanical aortic valve replacement with a 27 mm [employer] valve on 03/25/2010 in [place] for bicuspid aortic valve disease.  He said that the surgery was done urgently.  It is not known if he had an aortic aneurysm at that time but he had been followed by Dr. Murvin Natal in our practice with a 5.7 cm fusiform ascending aortic aneurysm.  He was last seen by Dr. Orvan Seen on 03/29/2020 and it was felt that he would be very high risk for redo sternotomy and replacement of his ascending aorta and continued follow-up would be the best course of action.  The patient has a history of morbid obesity, nonobstructive coronary disease, previous stroke, embolic right renal infarct, chronic systolic congestive heart failure with ejection fraction of 40 to 45%, OSA not on CPAP, and hyperlipidemia.  He denies any chest pain or shortness of breath.  He has been trying to exercise but has not had any weight loss.  Current Outpatient Medications  Medication Sig Dispense Refill   albuterol (PROVENTIL) (2.5 MG/3ML) 0.083% nebulizer solution USE 1 VIAL IN NEBULIZER EVERY 6 HOURS AS NEEDED FOR WHEEZING OR  SHORTNESS  OF  BREATH 150 mL 1   allopurinol (ZYLOPRIM) 100 MG tablet Take 1 tablet (100 mg total) by mouth daily. 90 tablet 1   aspirin 81 MG chewable tablet Chew 81 mg by mouth daily.     cyclobenzaprine (FLEXERIL) 10 MG tablet Take 1 tablet (10 mg total) by mouth 3 times/day as needed-between meals & bedtime for muscle spasms. 180 tablet 0   dapagliflozin propanediol (FARXIGA) 10 MG TABS tablet Take 1 tablet (10 mg total) by mouth daily. 90 tablet 3   EQ ALLERGY RELIEF, CETIRIZINE, 10 MG tablet Take 1 tablet by mouth once daily 90 tablet 0   fenofibrate (TRICOR) 48 MG tablet TAKE 1 TABLET BY MOUTH ONCE DAILY . 90 tablet 1   furosemide (LASIX) 40 MG tablet Take 1 tablet (40 mg total) by mouth in the morning and at bedtime. 180 tablet 3   metFORMIN  (GLUCOPHAGE XR) 500 MG 24 hr tablet Take 1 tablet (500 mg total) by mouth daily with breakfast. 90 tablet 1   metoprolol succinate (TOPROL-XL) 100 MG 24 hr tablet TAKE 1 TABLET BY MOUTH ONCE DAILY WITH  OR  IMMEDIATELY  FOLLOWING  A  MEAL 90 tablet 3   nortriptyline (PAMELOR) 10 MG capsule Take 1 capsule (10 mg total) by mouth at bedtime. 90 capsule 1   Omega-3 Fatty Acids (FISH OIL) 1000 MG CAPS Take 5,000 mg by mouth daily.     potassium chloride SA (KLOR-CON) 20 MEQ tablet Take 1 tablet (20 mEq total) by mouth daily. 90 tablet 3   rosuvastatin (CRESTOR) 40 MG tablet Take 1 tablet (40 mg total) by mouth daily. 90 tablet 3   spironolactone (ALDACTONE) 25 MG tablet Take 1 tablet (25 mg total) by mouth daily. 90 tablet 3   VENTOLIN HFA 108 (90 Base) MCG/ACT inhaler INHALE 2 PUFFS BY MOUTH EVERY 6 HOURS AS NEEDED FOR WHEEZING OR SHORTNESS OF BREATH 54 g 1   warfarin (COUMADIN) 4 MG tablet TAKE 1 TABLET BY MOUTH ONCE DAILY AT  6PM  AS  DIRECTED 90 tablet 1   warfarin (COUMADIN) 7.5 MG tablet Take 1 tablet (7.5 mg total) by mouth daily. 4 mg for 2 days, 7.5 mg on the 3rd day, and repeat 240 tablet 1   lisinopril (  ZESTRIL) 5 MG tablet Take 1 tablet (5 mg total) by mouth daily.     No current facility-administered medications for this visit.     Physical Exam: BP 97/64   Pulse 70   Resp 20   Ht '5\' 5"'$  (1.651 m)   Wt (!) 329 lb (149.2 kg)   SpO2 98% Comment: RA  BMI 54.75 kg/m  He is a morbidly obese gentleman in no distress. Cardiac exam shows a regular rate and rhythm with crisp mechanical valve sounds.  There is no murmur. Lungs are clear. There is no peripheral edema.  Diagnostic Tests:  Narrative & Impression  CLINICAL DATA:  Follow up thoracic aortic aneurysm. History of aortic valve replacement. Former smoker.   EXAM: CT CHEST WITHOUT CONTRAST   TECHNIQUE: Multidetector CT imaging of the chest was performed following the standard protocol without IV contrast.   COMPARISON:   CT chest 03/29/2020 and 09/29/2019.   FINDINGS: Cardiovascular: Status post aortic valve replacement. There is stable poststenotic dilatation of ascending aorta which has a maximal transverse diameter of 5.7 cm on axial image 67/2. This measures 5.4 cm on sagittal image 118/6 and is unchanged from the previous study. The aortic arch and descending aorta are normal in caliber. There is mild atherosclerosis of the aorta, great vessels and coronary arteries. No displaced intimal calcifications are identified. The heart size is normal. There is no pericardial effusion.   Mediastinum/Nodes: There are no enlarged mediastinal, hilar or axillary lymph nodes. The thyroid gland, trachea and esophagus demonstrate no significant findings.   Lungs/Pleura: There is no pleural effusion. Stable 6 x 4 mm left upper lobe nodule on image 63/8, unchanged from 05/01/2016, consistent with a benign finding. No new or enlarging pulmonary nodules.   Upper abdomen: Hepatic steatosis and mild contour irregularity of the liver are noted. Probable focal fat posterior to the IVC measuring 2.5 cm on image 134/2 is unchanged from 2017. No acute findings.   Musculoskeletal/Chest wall: There is no chest wall mass or suspicious osseous finding. Previous median sternotomy. Moderate bilateral gynecomastia.   IMPRESSION: 1. Stable poststenotic dilatation of the ascending aorta status post aortic valve replacement. This has a maximal diameter of 5.7 cm. Consider continued follow-up in 6-12 months. 2. No acute findings.  No evidence of mediastinal hematoma. 3. Coronary and Aortic Atherosclerosis (ICD10-I70.0). 4. Hepatic steatosis.     Electronically Signed   By: Richardean Sale M.D.   On: 03/16/2021 11:30    Impression:  He has a stable 5.7 cm fusiform ascending aortic aneurysm with a history of bicuspid aortic valve disease status post mechanical AVR in 2011.  His last echocardiogram in July 2020 showed a  normal functioning mechanical valve prosthesis with a mean gradient of 15 mmHg.  Cardiac catheterization at the same time showed moderate single-vessel coronary disease with a 60% large first diagonal stenosis.  He had a CTA of the chest in 04/2016 showing the ascending aorta to have a diameter of 51 mm and CTA of the chest in July 2020 showed it to be 5.5 x 5.3 cm.  His ascending aortic aneurysm has increased in size since 2017 but is stable at 5.7 cm compared to CT of the chest 1 year ago.  Given his relatively young age I think this is going to require surgical repair in the not too distant future.  His operative risk is certainly increased due to previous aortic valve replacement, morbid obesity, stage III chronic kidney disease with a creatinine of around  1.5, and he would likely require replacement of his entire ascending aorta was circulatory arrest and possibly removal of his mechanical valve prosthesis and replacement with a bioprosthesis if the aortic root has to be replaced.  I reviewed the CTA images with him and his friend and discussed my recommendation for continued follow-up over the next 6 months with a repeat CT scan of the chest.  I recommended that he work on dietary modification and weight loss to decrease his surgical risk.  Plan:  He will return to see me in 6 months with a CT scan of the chest without contrast since he has stage III chronic kidney disease.  I spent 20 minutes performing this established patient evaluation and > 50% of this time was spent face to face counseling and coordinating the care of this patient's aortic aneurysm.    Gaye Pollack, MD [facility] [phone]

## 2021-03-16 NOTE — Telephone Encounter (Signed)
Copied from [place] [id number]. Topic: General - Other >> Mar 16, 2021  1:32 PM Valere Dross wrote: Reason for CRM: Pts friend called in stating to have someone reach out to speak about pt medication, please advise.   Called and LVM asking for patient to please return my call.

## 2021-03-17 LAB — PROTIME-INR: INR: 2.4 — AB (ref 0.9–1.1)

## 2021-03-26 LAB — POCT INR: INR: 2.5 (ref 2.0–3.0)

## 2021-03-28 LAB — POCT INR: INR: 2.8 (ref 2.0–3.0)

## 2021-04-05 DIAGNOSIS — I10 Essential (primary) hypertension: Secondary | ICD-10-CM | POA: Diagnosis not present

## 2021-04-05 DIAGNOSIS — N1832 Chronic kidney disease, stage 3b: Secondary | ICD-10-CM | POA: Diagnosis not present

## 2021-04-05 DIAGNOSIS — E1122 Type 2 diabetes mellitus with diabetic chronic kidney disease: Secondary | ICD-10-CM | POA: Diagnosis not present

## 2021-04-06 DIAGNOSIS — Z952 Presence of prosthetic heart valve: Secondary | ICD-10-CM | POA: Diagnosis not present

## 2021-04-06 DIAGNOSIS — Z7901 Long term (current) use of anticoagulants: Secondary | ICD-10-CM | POA: Diagnosis not present

## 2021-04-06 LAB — PROTIME-INR: INR: 2.9 — AB (ref 0.9–1.1)

## 2021-04-12 ENCOUNTER — Other Ambulatory Visit (HOSPITAL_COMMUNITY): Payer: Self-pay | Admitting: Cardiology

## 2021-04-12 ENCOUNTER — Other Ambulatory Visit: Payer: Self-pay | Admitting: Family Medicine

## 2021-04-12 NOTE — Telephone Encounter (Signed)
Requested Prescriptions  Pending Prescriptions Disp Refills  . metFORMIN (GLUCOPHAGE-XR) 500 MG 24 hr tablet [Pharmacy Med Name: metFORMIN HCl ER 500 MG Oral Tablet Extended Release 24 Hour] 90 tablet 0    Sig: Take 1 tablet by mouth once daily with breakfast     Endocrinology:  Diabetes - Biguanides Failed - 04/12/2021  8:40 AM      Failed - Cr in normal range and within 360 days    Creatinine  Date Value Ref Range Status  05/25/2014 0.85 0.60 - 1.30 mg/dL Final   Creatinine, Ser  Date Value Ref Range Status  01/24/2021 1.49 (H) 0.76 - 1.27 mg/dL Final         Failed - eGFR in normal range and within 360 days    EGFR (African American)  Date Value Ref Range Status  05/25/2014 >60 >58mL/min Final   GFR calc Af Amer  Date Value Ref Range Status  05/12/2020 64 >59 mL/min/1.73 Final    Comment:    **In accordance with recommendations from the NKF-ASN Task force,**   [facility] is in the process of updating its eGFR calculation to the   2021 CKD-EPI creatinine equation that estimates kidney function   without a race variable.    EGFR (Non-African Amer.)  Date Value Ref Range Status  05/25/2014 >60 >9mL/min Final    Comment:    eGFR values <19mL/min/1.73 m2 may be an indication of chronic kidney disease (CKD). Calculated eGFR, using the MRDR Study equation, is useful in  patients with stable renal function. The eGFR calculation will not be reliable in acutely ill patients when serum creatinine is changing rapidly. It is not useful in patients on dialysis. The eGFR calculation may not be applicable to patients at the low and high extremes of body sizes, pregnant women, and vegetarians.    GFR, Estimated  Date Value Ref Range Status  08/26/2020 51 (L) >60 mL/min Final    Comment:    (NOTE) Calculated using the CKD-EPI Creatinine Equation (2021)    eGFR  Date Value Ref Range Status  01/24/2021 51 (L) >59 mL/min/1.73 Final         Passed - HBA1C is between 0 and 7.9  and within 180 days    Hemoglobin A1C  Date Value Ref Range Status  04/19/2016 6.6  Final   HB A1C (BAYER DCA - WAIVED)  Date Value Ref Range Status  01/24/2021 6.8 <7.0 % Final    Comment:                                          Diabetic Adult            <7.0                                       Healthy Adult        4.3 - 5.7                                                           (DCCT/NGSP) American Diabetes Association's Summary of Glycemic Recommendations for Adults with Diabetes: Hemoglobin A1c <  7.0%. More stringent glycemic goals (A1c <6.0%) may further reduce complications at the cost of increased risk of hypoglycemia.          Passed - Valid encounter within last 6 months    Recent Outpatient Visits          2 months ago Diabetes mellitus type 2, diet-controlled ([place])   [place], [name], DO   5 months ago Diabetes mellitus type 2, diet-controlled ([place])   [place], Haugan, DO   1 year ago Essential hypertension   [place], [place] P, DO   1 year ago Seasonal allergic rhinitis due to pollen   [facility], [name], DO   1 year ago Upper respiratory tract infection, unspecified type   [facility] Valerie Roys, DO      Future Appointments            In 2 weeks End, Harrell Gave, MD [facility], [place]   In 4 weeks Sinclair, Barbaraann Faster, NP [employer], [facility]   In 3 months  [employer], [place]           . fenofibrate (TRICOR) 48 MG tablet [Pharmacy Med Name: Fenofibrate 48 MG Oral Tablet] 90 tablet 0    Sig: Take 1 tablet by mouth once daily     Cardiovascular:  Antilipid - Fibric Acid Derivatives Failed - 04/12/2021  8:40 AM      Failed - Triglycerides in normal range and within 360 days    Triglycerides  Date Value Ref Range Status  10/25/2020 547 (H) 0 - 149 mg/dL Final  05/25/2014 167 0 - 200 mg/dL Final    Triglycerides Piccolo,Waived  Date Value Ref Range Status  05/17/2017 361 (H) <150 mg/dL Final    Comment:                            Normal                   <150                         Borderline High     150 - 199                         High                200 - 499                         Very High                >499          Failed - Cr in normal range and within 180 days    Creatinine  Date Value Ref Range Status  05/25/2014 0.85 0.60 - 1.30 mg/dL Final   Creatinine, Ser  Date Value Ref Range Status  01/24/2021 1.49 (H) 0.76 - 1.27 mg/dL Final         Failed - eGFR in normal range and within 180 days    EGFR (African American)  Date Value Ref Range Status  05/25/2014 >60 >40mL/min Final   GFR calc Af Amer  Date Value Ref Range Status  05/12/2020 64 >59 mL/min/1.73 Final    Comment:    **In accordance with recommendations  from the NKF-ASN Task force,**   [facility] is in the process of updating its eGFR calculation to the   2021 CKD-EPI creatinine equation that estimates kidney function   without a race variable.    EGFR (Non-African Amer.)  Date Value Ref Range Status  05/25/2014 >60 >55mL/min Final    Comment:    eGFR values <23mL/min/1.73 m2 may be an indication of chronic kidney disease (CKD). Calculated eGFR, using the MRDR Study equation, is useful in  patients with stable renal function. The eGFR calculation will not be reliable in acutely ill patients when serum creatinine is changing rapidly. It is not useful in patients on dialysis. The eGFR calculation may not be applicable to patients at the low and high extremes of body sizes, pregnant women, and vegetarians.    GFR, Estimated  Date Value Ref Range Status  08/26/2020 51 (L) >60 mL/min Final    Comment:    (NOTE) Calculated using the CKD-EPI Creatinine Equation (2021)    eGFR  Date Value Ref Range Status  01/24/2021 51 (L) >59 mL/min/1.73 Final         Passed - Total Cholesterol in  normal range and within 360 days    Cholesterol, Total  Date Value Ref Range Status  10/25/2020 199 100 - 199 mg/dL Final   Cholesterol  Date Value Ref Range Status  05/25/2014 131 0 - 200 mg/dL Final   Cholesterol Piccolo, Waived  Date Value Ref Range Status  05/17/2017 177 <200 mg/dL Final    Comment:                            Desirable                <200                         Borderline High      200- 239                         High                     >239          Passed - LDL in normal range and within 360 days    Ldl Cholesterol, Calc  Date Value Ref Range Status  05/25/2014 68 0 - 100 mg/dL Final   LDL Chol Calc (NIH)  Date Value Ref Range Status  10/25/2020 73 0 - 99 mg/dL Final   Direct LDL  Date Value Ref Range Status  06/04/2019 96.4 0 - 99 mg/dL Final    Comment:    Performed at [facility], [place] [address], [place], [place] [address]         Passed - HDL in normal range and within 360 days    HDL Cholesterol  Date Value Ref Range Status  05/25/2014 30 (L) 40 - 60 mg/dL Final   HDL  Date Value Ref Range Status  10/25/2020 40 >39 mg/dL Final         Passed - ALT in normal range and within 180 days    ALT  Date Value Ref Range Status  10/25/2020 25 0 - 44 IU/L Final   SGPT (ALT)  Date Value Ref Range Status  05/24/2014 20 U/L Final    Comment:    14-63 NOTE: New Reference Range 01/20/14  ALT (SGPT) Piccolo, Waived  Date Value Ref Range Status  05/17/2017 47 10 - 47 U/L Final         Passed - AST in normal range and within 180 days    AST  Date Value Ref Range Status  10/25/2020 22 0 - 40 IU/L Final   SGOT(AST)  Date Value Ref Range Status  05/24/2014 22 15 - 37 Unit/L Final   AST (SGOT) Piccolo, Waived  Date Value Ref Range Status  05/17/2017 31 11 - 38 U/L Final         Passed - Valid encounter within last 12 months    Recent Outpatient Visits          2 months ago Diabetes mellitus type 2, diet-controlled ([place])    [place], [name], DO   5 months ago Diabetes mellitus type 2, diet-controlled ([place])   [place], Gray, DO   1 year ago Essential hypertension   [place], [place] P, DO   1 year ago Seasonal allergic rhinitis due to pollen   [facility], [name], DO   1 year ago Upper respiratory tract infection, unspecified type   [place], Barb Merino, DO      Future Appointments            In 2 weeks End, Harrell Gave, MD [name], [place]   In 4 weeks [place], Barbaraann Faster, NP [employer], [facility]   In 3 months  [employer], [facility]           . allopurinol (ZYLOPRIM) 100 MG tablet [Pharmacy Med Name: Allopurinol 100 MG Oral Tablet] 90 tablet 0    Sig: Take 1 tablet by mouth once daily     Endocrinology:  Gout Agents Failed - 04/12/2021  8:40 AM      Failed - Cr in normal range and within 360 days    Creatinine  Date Value Ref Range Status  05/25/2014 0.85 0.60 - 1.30 mg/dL Final   Creatinine, Ser  Date Value Ref Range Status  01/24/2021 1.49 (H) 0.76 - 1.27 mg/dL Final         Passed - Uric Acid in normal range and within 360 days    Uric Acid  Date Value Ref Range Status  10/25/2020 8.3 3.8 - 8.4 mg/dL Final    Comment:               Therapeutic target for gout patients: <6.0         Passed - Valid encounter within last 12 months    Recent Outpatient Visits          2 months ago Diabetes mellitus type 2, diet-controlled ([place])   [place], [name], DO   5 months ago Diabetes mellitus type 2, diet-controlled ([place])   [place], [place], DO   1 year ago Essential hypertension   [place], [place] P, DO   1 year ago Seasonal allergic rhinitis due to pollen   [facility], [name], DO   1 year ago Upper respiratory tract infection, unspecified  type   [facility] [place], Barb Merino, DO      Future Appointments            In 2 weeks End, Harrell Gave, MD [facility], [place]   In 4 weeks [place], Barbaraann Faster, NP [employer],  [facility]   In 3 months  [employer], [facility]

## 2021-04-19 ENCOUNTER — Encounter: Payer: Self-pay | Admitting: Family Medicine

## 2021-04-19 LAB — POCT INR: INR: 2.8 (ref 2.0–3.0)

## 2021-04-27 ENCOUNTER — Ambulatory Visit (INDEPENDENT_AMBULATORY_CARE_PROVIDER_SITE_OTHER): Payer: Medicare Other | Admitting: Internal Medicine

## 2021-04-27 ENCOUNTER — Other Ambulatory Visit: Payer: Self-pay

## 2021-04-27 ENCOUNTER — Encounter: Payer: Self-pay | Admitting: Internal Medicine

## 2021-04-27 VITALS — BP 90/60 | HR 60 | Ht 66.0 in | Wt 327.0 lb

## 2021-04-27 DIAGNOSIS — I712 Thoracic aortic aneurysm, without rupture, unspecified: Secondary | ICD-10-CM

## 2021-04-27 DIAGNOSIS — N1831 Chronic kidney disease, stage 3a: Secondary | ICD-10-CM | POA: Diagnosis not present

## 2021-04-27 DIAGNOSIS — Z952 Presence of prosthetic heart valve: Secondary | ICD-10-CM

## 2021-04-27 DIAGNOSIS — I5022 Chronic systolic (congestive) heart failure: Secondary | ICD-10-CM | POA: Diagnosis not present

## 2021-04-27 DIAGNOSIS — I2510 Atherosclerotic heart disease of native coronary artery without angina pectoris: Secondary | ICD-10-CM | POA: Diagnosis not present

## 2021-04-27 LAB — POCT INR: INR: 3 (ref 2.0–3.0)

## 2021-04-27 NOTE — Patient Instructions (Signed)
Medication Instructions:   Your physician recommends that you continue on your current medications as directed. Please refer to the Current Medication list given to you today.  *If you need a refill on your cardiac medications before your next appointment, please call your pharmacy*   Lab Work:  None ordered  Testing/Procedures:  None ordered   Follow-Up: At [facility], you and your health needs are our priority.  As part of our continuing mission to provide you with exceptional heart care, we have created designated Provider Care Teams.  These Care Teams include your primary Cardiologist (physician) and Advanced Practice Providers (APPs -  Physician Assistants and Nurse Practitioners) who all work together to provide you with the care you need, when you need it.  We recommend signing up for the patient portal called "MyChart".  Sign up information is provided on this After Visit Summary.  MyChart is used to connect with patients for Virtual Visits (Telemedicine).  Patients are able to view lab/test results, encounter notes, upcoming appointments, etc.  Non-urgent messages can be sent to your provider as well.   To learn more about what you can do with MyChart, go to [email or web address].    Your next appointment:   3 month(s)  The format for your next appointment:   In Person  Provider:   You may see Nelva Bush, MD or one of the following Advanced Practice Providers on your designated Care Team:   Murray Hodgkins, NP Christell Faith, [name] Marrianne Mood, [name] Cadence Pasco, [place]

## 2021-04-27 NOTE — Progress Notes (Signed)
Follow-up Outpatient Visit Date: 04/27/2021  Primary Care Provider: Valerie Roys, DO [place] [id number]  Chief Complaint: Follow-up heart failure, valvular heart disease, thoracic aortic aneurysm  HPI:  Roberto [name] is a 65 y.o. male with history of mechanical aortic valve replacement, non-obstructive coronary artery disease, thoracic aortic aneurysm (followed by [facility]), stroke, right renal infarct, chronic systolic heart failure (LVEF 40-45%), hyperlipidemia, obstructive sleep apnea not using CPAP, and morbid obesity, who presents for follow-up of valvular heart disease, cardiomyopathy, and thoracic aortic aneurysm.  I last saw him 3 months ago, at which time he was feeling better with improved exercise tolerance and less shortness of breath.  He had not been using his CPAP regularly, as he was not comfortable with the full facemask.  Today, Roberto [name] reports that he has been feeling fairly well.  He is continuing to exercise though he had a 1 month hiatus due to a gout flare.  This has since resolved.  He denies chest pain, shortness of breath, palpitations, lightheadedness, or edema.  He has noticed a little bit of congestion in his chest the last few days.  --------------------------------------------------------------------------------------------------  Past Medical History:  Diagnosis Date   Bicuspid aortic valve    a. s/p #27 [employer]omed[employer] mechanical valve on 03/25/2010; b. on Coumadin; c. TTE 12/17: EF 40-45%, moderately dilated LV with moderate LVH, AVR well-seated with 14 mmHg gradient, peak AV velocity 2.5 m/s, mild mitral valve thickening with mild MR, mildly dilated RV with mildly reduced contraction   Cellulitis    Chronic systolic CHF (congestive heart failure) ([place])    a. R/LHC 03/2010 showed no significant CAD, LVEDP 31 mmHg, mean AoV gradient 34 mmHg at rest and 47 mmHg with dobutamine 20 mcg/kg/min, AVA 1.0 cm^2, RA 31, RV 68/25, PA 68/47, PCWP 38. PA sat 65%.  CO 6.2 L/min ([place]) and 5.3 L/min (thermodilution)   Clotting disorder (HCC)    H/O mechanical aortic valve replacement 03/25/2010   a. #27 Carbomedics mechanical valve   Hypercholesterolemia    Renal infarct [facility]) 2017   Multiple right renal infarcts, likely embolic.   Stroke [facility])    TIA (transient ischemic attack) 05/2014   Past Surgical History:  Procedure Laterality Date   AORTIC VALVE REPLACEMENT     CARDIAC CATHETERIZATION  03/21/2010   No significant CAD. Severe aortic stenosis. Severely elevated left and right heart filling pressures.   CARDIAC SURGERY  2009   CHF   CARPAL TUNNEL RELEASE Left 2005   RIGHT HEART CATH AND CORONARY ANGIOGRAPHY N/A 01/28/2019   Procedure: RIGHT HEART CATH AND CORONARY ANGIOGRAPHY;  Surgeon: Nelva Bush, MD;  Location: [facility];  Service: Cardiovascular;  Laterality: N/A;   TONSILLECTOMY  1962    Current Meds  Medication Sig   albuterol (PROVENTIL) (2.5 MG/3ML) 0.083% nebulizer solution USE 1 VIAL IN NEBULIZER EVERY 6 HOURS AS NEEDED FOR WHEEZING OR  SHORTNESS  OF  BREATH   allopurinol (ZYLOPRIM) 100 MG tablet Take 1 tablet by mouth once daily   aspirin 81 MG chewable tablet Chew 81 mg by mouth daily.   cyclobenzaprine (FLEXERIL) 10 MG tablet Take 1 tablet (10 mg total) by mouth 3 times/day as needed-between meals & bedtime for muscle spasms.   dapagliflozin propanediol (FARXIGA) 10 MG TABS tablet Take 1 tablet (10 mg total) by mouth daily.   EQ ALLERGY RELIEF, CETIRIZINE, 10 MG tablet Take 1 tablet by mouth once daily   fenofibrate (TRICOR) 48 MG tablet  Take 1 tablet by mouth once daily   furosemide (LASIX) 40 MG tablet Take 2 tablets (80 mg total) by mouth every morning AND 1 tablet (40 mg total) every evening. Needs appt.   lisinopril (ZESTRIL) 5 MG tablet Take 1 tablet (5 mg total) by mouth daily.   metFORMIN (GLUCOPHAGE-XR) 500 MG 24 hr tablet Take 1 tablet by mouth once daily with breakfast   metoprolol succinate  (TOPROL-XL) 100 MG 24 hr tablet TAKE 1 TABLET BY MOUTH ONCE DAILY WITH  OR  IMMEDIATELY  FOLLOWING  A  MEAL   nortriptyline (PAMELOR) 10 MG capsule Take 1 capsule (10 mg total) by mouth at bedtime.   Omega-3 Fatty Acids (FISH OIL) 1000 MG CAPS Take 5,000 mg by mouth daily.   potassium chloride SA (KLOR-CON) 20 MEQ tablet Take 1 tablet (20 mEq total) by mouth daily.   rosuvastatin (CRESTOR) 40 MG tablet Take 1 tablet (40 mg total) by mouth daily.   spironolactone (ALDACTONE) 25 MG tablet Take 1 tablet (25 mg total) by mouth daily.   VENTOLIN HFA 108 (90 Base) MCG/ACT inhaler INHALE 2 PUFFS BY MOUTH EVERY 6 HOURS AS NEEDED FOR WHEEZING OR SHORTNESS OF BREATH   warfarin (COUMADIN) 4 MG tablet TAKE 1 TABLET BY MOUTH ONCE DAILY AT  6PM  AS  DIRECTED   warfarin (COUMADIN) 7.5 MG tablet Take 1 tablet (7.5 mg total) by mouth daily. 4 mg for 2 days, 7.5 mg on the 3rd day, and repeat    Allergies: Patient has no known allergies.  Social History   Tobacco Use   Smoking status: Former    Packs/day: 0.50    Years: 46.00    Pack years: 23.00    Types: Cigarettes    Start date: 07/16/2017    Quit date: 05/11/2019    Years since quitting: 1.9   Smokeless tobacco: Never  Vaping Use   Vaping Use: Never used  Substance Use Topics   Alcohol use: No    Alcohol/week: 0.0 standard drinks   Drug use: No    Family History  Problem Relation Age of Onset   Arthritis Mother    Dementia Mother    Colon cancer Mother    Arthritis Father    Diabetes Father    Stroke Father    Colon cancer Father    Heart attack Brother    Breast cancer Sister    Seizures Sister    Cancer Brother        brain   Heart disease Brother    Heart attack Brother     Review of Systems: A 12-system review of systems was performed and was negative except as noted in the HPI.  --------------------------------------------------------------------------------------------------  Physical Exam: BP 90/60 (BP Location: Left  Arm, Patient Position: Sitting, Cuff Size: Large)   Pulse 60   Ht 5\' 6"  (1.676 m)   Wt (!) 327 lb (148.3 kg)   SpO2 96%   BMI 52.78 kg/m   General:  NAD. Neck: No JVD or HJR, though body habitus limits evaluation. Lungs: Clear to auscultation bilaterally without wheezes or crackles. Heart: Regular rate and rhythm without murmurs, rubs, or gallops. Abdomen: Soft, nontender, nondistended. Extremities: No lower extremity edema.  Lab Results  Component Value Date   WBC 7.7 10/25/2020   HGB 13.4 10/25/2020   HCT 42.5 10/25/2020   MCV 86 10/25/2020   PLT 332 10/25/2020    Lab Results  Component Value Date   NA 136 01/24/2021   K  4.4 01/24/2021   CL 97 01/24/2021   CO2 22 01/24/2021   BUN 24 01/24/2021   CREATININE 1.49 (H) 01/24/2021   GLUCOSE 161 (H) 01/24/2021   ALT 25 10/25/2020    Lab Results  Component Value Date   CHOL 199 10/25/2020   HDL 40 10/25/2020   LDLCALC 73 10/25/2020   LDLDIRECT 96.4 06/04/2019   TRIG 547 (H) 10/25/2020   CHOLHDL 4.3 08/08/2019    --------------------------------------------------------------------------------------------------  ASSESSMENT AND PLAN: Chronic HFrEF: Volume status appears stable with continued NYHA class II symptoms.  Soft blood pressure precludes further escalation of GDMT today.  Nonobstructive coronary artery disease: No angina reported.  Continue aspirin and statin therapy.  Status post AVR: No evidence of valve failure.  Continue aspirin and warfarin.  Thoracic aortic aneurysm: Significantly dilated thoracic aorta, albeit stable by serial imaging.  Continue aggressive blood pressure and lipid control with ongoing surveillance by cardiac surgery.  Further weight loss encouraged by cardiac surgery prior to consideration of repair.  Chronic kidney disease stage III: Renal function and electrolytes have been stable with ongoing follow-up per nephrology.  Patient and his significant other are concerned about  ongoing potassium use and other medications in the setting of "kidney failure."  We discussed the nature of chronic kidney disease and importance of continued medical therapy to help maintain kidney function as well as treat his cardiomyopathy and other comorbidities.  We will defer medication changes today.  Morbid obesity: BMI remains greater than 50 though weight is gradually improving with continued lifestyle modifications.  I encouraged Roberto [name] to keep working on this through diet and exercise.  Follow-up: Return to clinic in 3 months.  Nelva Bush, MD 04/28/2021 7:21 AM

## 2021-04-28 ENCOUNTER — Encounter: Payer: Self-pay | Admitting: Internal Medicine

## 2021-04-28 ENCOUNTER — Encounter: Payer: Medicare Other | Admitting: Family Medicine

## 2021-05-04 ENCOUNTER — Encounter: Payer: Medicare Other | Admitting: Family Medicine

## 2021-05-07 ENCOUNTER — Encounter: Payer: Self-pay | Admitting: Nurse Practitioner

## 2021-05-08 LAB — POCT INR: INR: 2.6 (ref 2.0–3.0)

## 2021-05-11 ENCOUNTER — Encounter: Payer: Self-pay | Admitting: Nurse Practitioner

## 2021-05-11 ENCOUNTER — Other Ambulatory Visit: Payer: Self-pay

## 2021-05-11 ENCOUNTER — Ambulatory Visit (INDEPENDENT_AMBULATORY_CARE_PROVIDER_SITE_OTHER): Payer: Medicare Other | Admitting: Nurse Practitioner

## 2021-05-11 ENCOUNTER — Telehealth: Payer: Self-pay

## 2021-05-11 VITALS — BP 103/59 | HR 68 | Temp 98.4°F | Ht 64.5 in | Wt 326.2 lb

## 2021-05-11 DIAGNOSIS — I152 Hypertension secondary to endocrine disorders: Secondary | ICD-10-CM | POA: Diagnosis not present

## 2021-05-11 DIAGNOSIS — N280 Ischemia and infarction of kidney: Secondary | ICD-10-CM | POA: Diagnosis not present

## 2021-05-11 DIAGNOSIS — Z952 Presence of prosthetic heart valve: Secondary | ICD-10-CM | POA: Diagnosis not present

## 2021-05-11 DIAGNOSIS — M1A3720 Chronic gout due to renal impairment, left ankle and foot, without tophus (tophi): Secondary | ICD-10-CM | POA: Diagnosis not present

## 2021-05-11 DIAGNOSIS — Z0000 Encounter for general adult medical examination without abnormal findings: Secondary | ICD-10-CM

## 2021-05-11 DIAGNOSIS — E1159 Type 2 diabetes mellitus with other circulatory complications: Secondary | ICD-10-CM | POA: Diagnosis not present

## 2021-05-11 DIAGNOSIS — N1831 Chronic kidney disease, stage 3a: Secondary | ICD-10-CM | POA: Diagnosis not present

## 2021-05-11 DIAGNOSIS — I700 Atherosclerosis of aorta: Secondary | ICD-10-CM

## 2021-05-11 DIAGNOSIS — N400 Enlarged prostate without lower urinary tract symptoms: Secondary | ICD-10-CM | POA: Diagnosis not present

## 2021-05-11 DIAGNOSIS — E119 Type 2 diabetes mellitus without complications: Secondary | ICD-10-CM | POA: Diagnosis not present

## 2021-05-11 DIAGNOSIS — Z23 Encounter for immunization: Secondary | ICD-10-CM | POA: Diagnosis not present

## 2021-05-11 DIAGNOSIS — J432 Centrilobular emphysema: Secondary | ICD-10-CM

## 2021-05-11 DIAGNOSIS — E1169 Type 2 diabetes mellitus with other specified complication: Secondary | ICD-10-CM | POA: Diagnosis not present

## 2021-05-11 DIAGNOSIS — E785 Hyperlipidemia, unspecified: Secondary | ICD-10-CM

## 2021-05-11 DIAGNOSIS — I5022 Chronic systolic (congestive) heart failure: Secondary | ICD-10-CM

## 2021-05-11 DIAGNOSIS — I2510 Atherosclerotic heart disease of native coronary artery without angina pectoris: Secondary | ICD-10-CM

## 2021-05-11 DIAGNOSIS — Z7901 Long term (current) use of anticoagulants: Secondary | ICD-10-CM | POA: Diagnosis not present

## 2021-05-11 LAB — BAYER DCA HB A1C WAIVED: HB A1C (BAYER DCA - WAIVED): 6.5 % — ABNORMAL HIGH (ref 4.8–5.6)

## 2021-05-11 LAB — MICROALBUMIN, URINE WAIVED
Creatinine, Urine Waived: 50 mg/dL (ref 10–300)
Microalb, Ur Waived: 10 mg/L (ref 0–19)
Microalb/Creat Ratio: <30 mg/g (ref <30)

## 2021-05-11 LAB — COAGUCHEK XS/INR WAIVED
INR: 3.6 — ABNORMAL HIGH (ref 0.9–1.1)
Prothrombin Time: 43.8 s

## 2021-05-11 LAB — POCT INR: INR: 3.6 — AB (ref 2.0–3.0)

## 2021-05-11 NOTE — Assessment & Plan Note (Signed)
Chronic, stable with A1c 6.5% today.  Continue Farxiga daily which offers kidney and heart protection.  Check BS TID and document for provider.  Urine ALB done today.  Will check CMP.  Return in 3 months.

## 2021-05-11 NOTE — Assessment & Plan Note (Signed)
Chronic, ongoing.  Will trial Breztri sample in office, will have them come back in to pick up, and if this causes any oral yeast issues will restart Advair.  Continue as needed Albuterol.  Keep with the yearly lung screening.

## 2021-05-11 NOTE — Telephone Encounter (Signed)
[name] states he is currently in the area and she will swing by and grab the samples and the package for patient. Please advise about the package?

## 2021-05-11 NOTE — Telephone Encounter (Signed)
-----   Message from Venita Lick, NP sent at 05/11/2021 12:42 PM EST ----- Please call patient significant other -- alert her I apologize she was to get a Breztri sample and I forgot to pass this to them -- please tell them they can come in office to obtain over the next days and also will provide the advanced care planning package too.  Thanks.

## 2021-05-11 NOTE — Assessment & Plan Note (Signed)
Noted on past imaging, continue statin daily and Coumadin for protection.  Discussed with patient.

## 2021-05-11 NOTE — Assessment & Plan Note (Signed)
Continue Coumadin and call if any concerns present.

## 2021-05-11 NOTE — Assessment & Plan Note (Signed)
Chronic, ongoing.  INR 3.6 today, did eat broccoli last night (lots of it).  They check at home and will recheck next week.

## 2021-05-11 NOTE — Progress Notes (Signed)
BP (!) 103/59   Pulse 68   Temp 98.4 F (36.9 C) (Oral)   Ht 5' 4.5" (1.638 m)   Wt (!) 326 lb 3.2 oz (148 kg)   SpO2 91%   BMI 55.13 kg/m    Subjective:    Patient ID: Abhijay Morriss, male    DOB: 08-08-1955, 65 y.o.   MRN: [id number]  HPI: Kriss Ishler is a 65 y.o. male presenting on 05/11/2021 for comprehensive medical examination. Current medical complaints include:none  He currently lives with: significant other Interim Problems from his last visit: no  Concerned about tremor to left arm, noted bilaterally on exam.  Has had this since the 80's, but has become worse over past 3 years.  A lot of times can not sign name.  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  DIABETES A1c in July 6.8% and is taking Farxiga 10 MG daily. Hypoglycemic episodes:no Polydipsia/polyuria: no Visual disturbance: no Chest pain: no Paresthesias: no Glucose Monitoring: no  Accucheck frequency: Not Checking  Fasting glucose:  Post prandial:  Evening:  Before meals: Taking Insulin?: no  Long acting insulin:  Short acting insulin: Blood Pressure Monitoring: a few times a week Retinal Examination: Not up to Date Foot Exam: Up to Date Pneumovax:  done today Influenza: Up to Date Aspirin: yes   COPD Continues on Albuterol -- had thrush with Trelegy and reports did not work.  He would like to go back on Advair, which he had good results with.  Discussed trial of Breztri instead. COPD status: stable Satisfied with current treatment?: yes Oxygen use: no Dyspnea frequency: none Cough frequency: occasional -- more at night Rescue inhaler frequency:   Limitation of activity: no Productive cough: none Last Spirometry: 03/28/2019 Pneumovax: Up to Date Influenza: Up to Date   HYPERTENSION / HYPERLIPIDEMIA/HF Saw Dr. Saunders Revel last on 04/27/21 -- they are monitoring for cardiac surgery.  All medications maintained.  Continues on  Coumadin daily, today was 3.6 and last week 2.7.  Coumadin every 2 days he takes 4 MG, Wednesday 7.5 MG.  --- ate broccoli a lot last night. Satisfied with current treatment? yes Duration of hypertension: chronic BP monitoring frequency: a few times a week BP range:  BP medication side effects: no Duration of hyperlipidemia: chronic Cholesterol medication side effects: no Cholesterol supplements: none Medication compliance: good compliance Aspirin: yes Recent stressors: no Recurrent headaches: no Visual changes: no Palpitations: no Dyspnea: no Chest pain: no Lower extremity edema: no Dizzy/lightheaded: no  CHRONIC KIDNEY DISEASE Last saw nephrology 04/05/21. CKD status: stable Medications renally dose: yes Previous renal evaluation: yes Pneumovax:  Up to Date Influenza Vaccine:  Up to Date    Functional Status Survey: Is the patient deaf or have difficulty hearing?: No Does the patient have difficulty seeing, even when wearing glasses/contacts?: No Does the patient have difficulty concentrating, remembering, or making decisions?: No Does the patient have difficulty walking or climbing stairs?: No Does the patient have difficulty dressing or bathing?: No Does the patient have difficulty doing errands alone such as visiting a doctor's office or shopping?: No  FALL RISK: Fall Risk  05/11/2021 10/25/2020 07/16/2020 09/01/2019 07/14/2019  Falls in the past year? 0 0 0 0 0  Number falls in past yr: 0 0 - - 0  Injury with Fall? 0 0 - - 0  Risk for fall due to : No Fall Risks No Fall Risks Medication side  effect - -  Follow up Falls prevention discussed Falls evaluation completed Falls evaluation completed;Education provided;Falls prevention discussed - -    Depression Screen Depression screen [facility] 2/9 07/16/2020 09/01/2019 07/14/2019  Decreased Interest 3 0 0  Down, Depressed, Hopeless 0 1 1  PHQ - 2 Score 3 1 1   Altered sleeping 0 - -  Tired, decreased energy 0 - -  Change in  appetite 0 - -  Feeling bad or failure about yourself  0 - -  Trouble concentrating 0 - -  Moving slowly or fidgety/restless 0 - -  Suicidal thoughts 0 - -  PHQ-9 Score 3 - -  Difficult doing work/chores Not difficult at all - -  Some recent data might be hidden    Advanced Directives A voluntary discussion about advance care planning including the explanation and discussion of advance directives was extensively discussed  with the patient for 15 minutes with patient and significant other present.  Explanation about the health care proxy and Living will was reviewed and packet with forms with explanation of how to fill them out was given.  During this discussion, the patient was able to identify a health care proxy as [name] and plans to fill out the paperwork required.  Patient was offered a separate [place] visit for further assistance with forms.     Past Medical History:  Past Medical History:  Diagnosis Date   Bicuspid aortic valve    a. s/p #27 Carbomedics mechanical valve on 03/25/2010; b. on Coumadin; c. TTE 12/17: EF 40-45%, moderately dilated LV with moderate LVH, AVR well-seated with 14 mmHg gradient, peak AV velocity 2.5 m/s, mild mitral valve thickening with mild MR, mildly dilated RV with mildly reduced contraction   Cellulitis    Chronic systolic CHF (congestive heart failure) ([place])    a. R/LHC 03/2010 showed no significant CAD, LVEDP 31 mmHg, mean AoV gradient 34 mmHg at rest and 47 mmHg with dobutamine 20 mcg/kg/min, AVA 1.0 cm^2, RA 31, RV 68/25, PA 68/47, PCWP 38. PA sat 65%. CO 6.2 L/min ([place]) and 5.3 L/min (thermodilution)   Clotting disorder (HCC)    H/O mechanical aortic valve replacement 03/25/2010   a. #27 Carbomedics mechanical valve   Hypercholesterolemia    Renal infarct [facility]) 2017   Multiple right renal infarcts, likely embolic.   Stroke [facility])    TIA (transient ischemic attack) 05/2014    Surgical History:  Past Surgical History:  Procedure  Laterality Date   AORTIC VALVE REPLACEMENT     CARDIAC CATHETERIZATION  03/21/2010   No significant CAD. Severe aortic stenosis. Severely elevated left and right heart filling pressures.   CARDIAC SURGERY  2009   CHF   CARPAL TUNNEL RELEASE Left 2005   RIGHT HEART CATH AND CORONARY ANGIOGRAPHY N/A 01/28/2019   Procedure: RIGHT HEART CATH AND CORONARY ANGIOGRAPHY;  Surgeon: Nelva Bush, MD;  Location: [facility];  Service: Cardiovascular;  Laterality: N/A;   TONSILLECTOMY  1962    Medications:  Current Outpatient Medications on File Prior to Visit  Medication Sig   albuterol (PROVENTIL) (2.5 MG/3ML) 0.083% nebulizer solution USE 1 VIAL IN NEBULIZER EVERY 6 HOURS AS NEEDED FOR WHEEZING OR  SHORTNESS  OF  BREATH   allopurinol (ZYLOPRIM) 100 MG tablet Take 1 tablet by mouth once daily   aspirin 81 MG chewable tablet Chew 81 mg by mouth daily.   cyclobenzaprine (FLEXERIL) 10 MG tablet Take 1 tablet (10 mg total) by mouth 3 times/day as  needed-between meals & bedtime for muscle spasms.   dapagliflozin propanediol (FARXIGA) 10 MG TABS tablet Take 1 tablet (10 mg total) by mouth daily.   EQ ALLERGY RELIEF, CETIRIZINE, 10 MG tablet Take 1 tablet by mouth once daily   fenofibrate (TRICOR) 48 MG tablet Take 1 tablet by mouth once daily   furosemide (LASIX) 40 MG tablet Take 2 tablets (80 mg total) by mouth every morning AND 1 tablet (40 mg total) every evening. Needs appt.   lisinopril (ZESTRIL) 5 MG tablet Take 1 tablet (5 mg total) by mouth daily.   metFORMIN (GLUCOPHAGE-XR) 500 MG 24 hr tablet Take 1 tablet by mouth once daily with breakfast   metoprolol succinate (TOPROL-XL) 100 MG 24 hr tablet TAKE 1 TABLET BY MOUTH ONCE DAILY WITH  OR  IMMEDIATELY  FOLLOWING  A  MEAL   nortriptyline (PAMELOR) 10 MG capsule Take 1 capsule (10 mg total) by mouth at bedtime.   Omega-3 Fatty Acids (FISH OIL) 1000 MG CAPS Take 5,000 mg by mouth daily.   potassium chloride SA (KLOR-CON) 20 MEQ tablet  Take 1 tablet (20 mEq total) by mouth daily.   rosuvastatin (CRESTOR) 40 MG tablet Take 1 tablet (40 mg total) by mouth daily.   spironolactone (ALDACTONE) 25 MG tablet Take 1 tablet (25 mg total) by mouth daily.   VENTOLIN HFA 108 (90 Base) MCG/ACT inhaler INHALE 2 PUFFS BY MOUTH EVERY 6 HOURS AS NEEDED FOR WHEEZING OR SHORTNESS OF BREATH   warfarin (COUMADIN) 4 MG tablet TAKE 1 TABLET BY MOUTH ONCE DAILY AT  6PM  AS  DIRECTED   warfarin (COUMADIN) 7.5 MG tablet Take 1 tablet (7.5 mg total) by mouth daily. 4 mg for 2 days, 7.5 mg on the 3rd day, and repeat   No current facility-administered medications on file prior to visit.    Allergies:  No Known Allergies  Social History:  Social History   Socioeconomic History   Marital status: Single    Spouse name: Not on file   Number of children: 1   Years of education: 4   Highest education level: Associate degree: academic program  Occupational History   Occupation: Disabled    Employer: UNEMPLOYED  Tobacco Use   Smoking status: Former    Packs/day: 0.50    Years: 46.00    Pack years: 23.00    Types: Cigarettes    Start date: 07/16/2017    Quit date: 05/11/2019    Years since quitting: 2.0   Smokeless tobacco: Never  Vaping Use   Vaping Use: Never used  Substance and Sexual Activity   Alcohol use: No    Alcohol/week: 0.0 standard drinks   Drug use: No   Sexual activity: Not Currently  Other Topics Concern   Not on file  Social History Narrative   Not on file   Social Determinants of Health   Financial Resource Strain: Low Risk    Difficulty of Paying Living Expenses: Not hard at all  Food Insecurity: No Food Insecurity   Worried About Charity fundraiser in the Last Year: Never true   [place] in the Last Year: Never true  Transportation Needs: No Transportation Needs   Lack of Transportation (Medical): No   Lack of Transportation (Non-Medical): No  Physical Activity: Inactive   Days of Exercise per Week: 0  days   Minutes of Exercise per Session: 0 min  Stress: No Stress Concern Present   Feeling of Stress : Not at  all  Social Connections: Not on file  Intimate Partner Violence: Not on file   Social History   Tobacco Use  Smoking Status Former   Packs/day: 0.50   Years: 46.00   Pack years: 23.00   Types: Cigarettes   Start date: 07/16/2017   Quit date: 05/11/2019   Years since quitting: 2.0  Smokeless Tobacco Never   Social History   Substance and Sexual Activity  Alcohol Use No   Alcohol/week: 0.0 standard drinks    Family History:  Family History  Problem Relation Age of Onset   Arthritis Mother    Dementia Mother    Colon cancer Mother    Arthritis Father    Diabetes Father    Stroke Father    Colon cancer Father    Heart attack Brother    Breast cancer Sister    Seizures Sister    Cancer Brother        brain   Heart disease Brother    Heart attack Brother     Past medical history, surgical history, medications, allergies, family history and social history reviewed with patient today and changes made to appropriate areas of the chart.   ROS All other ROS negative except what is listed above and in the HPI.      Objective:    BP (!) 103/59   Pulse 68   Temp 98.4 F (36.9 C) (Oral)   Ht 5' 4.5" (1.638 m)   Wt (!) 326 lb 3.2 oz (148 kg)   SpO2 91%   BMI 55.13 kg/m   Wt Readings from Last 3 Encounters:  05/11/21 (!) 326 lb 3.2 oz (148 kg)  04/27/21 (!) 327 lb (148.3 kg)  03/16/21 (!) 329 lb (149.2 kg)    Physical Exam Vitals and nursing note reviewed.  Constitutional:      General: He is awake. He is not in acute distress.    Appearance: He is well-developed and well-groomed. He is morbidly obese. He is not ill-appearing or toxic-appearing.  HENT:     Head: Normocephalic and atraumatic.     Right Ear: Hearing, tympanic membrane, ear canal and external ear normal. No drainage.     Left Ear: Hearing, tympanic membrane, ear canal and external ear  normal. No drainage.     Nose: Nose normal.     Mouth/Throat:     Pharynx: Uvula midline.  Eyes:     General: Lids are normal.        Right eye: No discharge.        Left eye: No discharge.     Extraocular Movements: Extraocular movements intact.     Conjunctiva/sclera: Conjunctivae normal.     Pupils: Pupils are equal, round, and reactive to light.     Visual Fields: Right eye visual fields normal and left eye visual fields normal.  Neck:     Thyroid: No thyromegaly.     Vascular: No carotid bruit or JVD.     Trachea: Trachea normal.  Cardiovascular:     Rate and Rhythm: Normal rate and regular rhythm.     Heart sounds: Normal heart sounds, S1 normal and S2 normal. No murmur heard.   No gallop.  Pulmonary:     Effort: Pulmonary effort is normal. No accessory muscle usage or respiratory distress.     Breath sounds: Normal breath sounds.  Abdominal:     General: Bowel sounds are normal.     Palpations: Abdomen is soft. There is no hepatomegaly or  splenomegaly.     Tenderness: There is no abdominal tenderness.  Musculoskeletal:        General: Normal range of motion.     Cervical back: Normal range of motion and neck supple.     Right lower leg: No edema.     Left lower leg: No edema.  Lymphadenopathy:     Head:     Right side of head: No submental, submandibular, tonsillar, preauricular or posterior auricular adenopathy.     Left side of head: No submental, submandibular, tonsillar, preauricular or posterior auricular adenopathy.     Cervical: No cervical adenopathy.  Skin:    General: Skin is warm and dry.     Capillary Refill: Capillary refill takes less than 2 seconds.     Findings: No rash.  Neurological:     Mental Status: He is alert and oriented to person, place, and time.     Gait: Gait is intact.     Deep Tendon Reflexes: Reflexes are normal and symmetric.     Reflex Scores:      Brachioradialis reflexes are 2+ on the right side and 2+ on the left side.       Patellar reflexes are 2+ on the right side and 2+ on the left side. Psychiatric:        Attention and Perception: Attention normal.        Mood and Affect: Mood normal.        Speech: Speech normal.        Behavior: Behavior normal. Behavior is cooperative.        Thought Content: Thought content normal.        Cognition and Memory: Cognition normal.        Judgment: Judgment normal.   Diabetic Foot Exam - Simple   Simple Foot Form Visual Inspection See comments: Yes Sensation Testing Intact to touch and monofilament testing bilaterally: Yes Pulse Check See comments: Yes Comments Sensation 10/10 bilaterally.  Pulses 1+ DP and PT bilaterally.  Visual inspection shows xerosis scattered across both feet and onychomycosis bilaterally.    6CIT Screen 07/16/2020 07/11/2018 07/06/2017  What Year? 0 points 0 points 0 points  What month? 0 points 0 points 0 points  What time? 0 points 0 points 0 points  Count back from 20 0 points 0 points 0 points  Months in reverse 0 points 0 points 0 points  Repeat phrase 8 points 0 points 0 points  Total Score 8 0 0   Results for orders placed or performed in visit on 04/22/21  Protime-INR  Result Value Ref Range   INR 2.9 (A) 0.9 - 1.1      Assessment & Plan:   Problem List Items Addressed This Visit       Cardiovascular and Mediastinum   Aortic atherosclerosis ([place])    Noted on past imaging, continue statin daily and Coumadin for protection.  Discussed with patient.      Chronic HFrEF (heart failure with reduced ejection fraction) (HCC)    Chronic, ongoing, followed by cardiology -- is on medication treatment for this including Farxiga for both heart and diabetes.  Recommend she continue all medications as ordered.  Recommend: - Reminded to call for an overnight weight gain of >2 pounds or a weekly weight gain of >5 pounds - not adding salt to food and read food labels. Reviewed the importance of keeping daily sodium intake to <2000 MG  daily.      Coronary artery disease involving  native coronary artery of native heart without angina pectoris    Chronic, ongoing, continue collaboration with cardiology and current medication regimen as prescribed by them.      Hypertension associated with diabetes ([place])    Chronic, ongoing with BP at goal today.  Recommend he monitor BP at least a few mornings a week at home and document.  DASH diet at home.  Continue current medication regimen and adjust as needed.  Labs today: CBC, CMP, TSH, urine ALB.  Return in 3 months.       Relevant Orders   Bayer DCA Hb A1c Waived   Microalbumin, Urine Waived   Comprehensive metabolic panel   CBC with Differential/Platelet   TSH   Renal infarct (HCC)    Continue Coumadin and call if any concerns present.        Respiratory   COPD (chronic obstructive pulmonary disease) (HCC)    Chronic, ongoing.  Will trial Breztri sample in office, will have them come back in to pick up, and if this causes any oral yeast issues will restart Advair.  Continue as needed Albuterol.  Keep with the yearly lung screening.        Endocrine   Diabetes mellitus type 2, diet-controlled ([place]) - Primary    Chronic, stable with A1c 6.5% today.  Continue Farxiga daily which offers kidney and heart protection.  Check BS TID and document for provider.  Urine ALB done today.  Will check CMP.  Return in 3 months.      Relevant Orders   Bayer DCA Hb A1c Waived   Hyperlipidemia associated with type 2 diabetes mellitus (HCC)    Chronic, ongoing.  Continue current medication regimen and adjust as needed. Lipid panel and CMP today.      Relevant Orders   Bayer DCA Hb A1c Waived   Lipid Panel w/o Chol/HDL Ratio     Genitourinary   Chronic kidney disease, stage 3a (HCC)    Chronic, ongoing.  Continue current medication regimen and adjust as needed.  Continue collaboration with nephrology.  CMP today and urine ALB today.      Relevant Orders   Microalbumin, Urine  Waived   Comprehensive metabolic panel   CBC with Differential/Platelet     Other   Chronic gout without tophus    Chronic, ongoing with no recent flare.  Continue Allopurinol and adjust as needed.  Uric acid level today.      Relevant Orders   Uric acid   H/O mechanical aortic valve replacement    Chronic, ongoing.  INR 3.6 today, did eat broccoli last night (lots of it).  They check at home and will recheck next week.      Relevant Orders   CoaguChek XS/INR Waived   Morbid obesity ([place])    BMI 55.13 with T2DM, HTN/HLD.  Recommended eating smaller high protein, low fat meals more frequently and exercising 30 mins a day 5 times a week with a goal of 10-15lb weight loss in the next 3 months. Patient voiced their understanding and motivation to adhere to these recommendations.       Other Visit Diagnoses     Benign prostatic hyperplasia without lower urinary tract symptoms       PSA on labs    Relevant Orders   PSA   Pneumococcal vaccination given       PCV13 provided today   Relevant Orders   Pneumococcal conjugate vaccine 13-valent (Completed)   Flu vaccine need  High dose flu vaccine.   Relevant Orders   Flu Vaccine QUAD High Dose(Fluad) (Completed)       Discussed aspirin prophylaxis for myocardial infarction prevention and decision was recommended to continue.  LABORATORY TESTING:  Health maintenance labs ordered today as discussed above.   The natural history of prostate cancer and ongoing controversy regarding screening and potential treatment outcomes of prostate cancer has been discussed with the patient. The meaning of a false positive PSA and a false negative PSA has been discussed. He indicates understanding of the limitations of this screening test and wishes to proceed with screening PSA testing.   IMMUNIZATIONS:   - Tdap: Tetanus vaccination status reviewed: last tetanus booster within 10 years. - Influenza: Administered today - Pneumovax: Not  applicable - Prevnar: Administered today - Zostavax vaccine: Not applicable  SCREENING: - Colonoscopy: had in past with issues  Discussed with patient purpose of the colonoscopy is to detect colon cancer at curable precancerous or early stages   - AAA Screening: Not applicable  -Hearing Test: Not applicable  -Spirometry: Not applicable   PATIENT COUNSELING:    Sexuality: Discussed sexually transmitted diseases, partner selection, use of condoms, avoidance of unintended pregnancy  and contraceptive alternatives.   Advised to avoid cigarette smoking.  I discussed with the patient that most people either abstain from alcohol or drink within safe limits (<=14/week and <=4 drinks/occasion for males, <=7/weeks and <= 3 drinks/occasion for females) and that the risk for alcohol disorders and other health effects rises proportionally with the number of drinks per week and how often a drinker exceeds daily limits.  Discussed cessation/primary prevention of drug use and availability of treatment for abuse.   Diet: Encouraged to adjust caloric intake to maintain  or achieve ideal body weight, to reduce intake of dietary saturated fat and total fat, to limit sodium intake by avoiding high sodium foods and not adding table salt, and to maintain adequate dietary potassium and calcium preferably from fresh fruits, vegetables, and low-fat dairy products.    Stressed the importance of regular exercise  Injury prevention: Discussed safety belts, safety helmets, smoke detector, smoking near bedding or upholstery.   Dental health: Discussed importance of regular tooth brushing, flossing, and dental visits.   Follow up plan: NEXT PREVENTATIVE PHYSICAL DUE IN 1 YEAR. Return in about 3 months (around 08/11/2021) for T2DM, HTN/HLD, CKD, HF, TREMORS, COPD -- Dr. Lenna Sciara patient.

## 2021-05-11 NOTE — Assessment & Plan Note (Signed)
Chronic, ongoing with BP at goal today.  Recommend he monitor BP at least a few mornings a week at home and document.  DASH diet at home.  Continue current medication regimen and adjust as needed.  Labs today: CBC, CMP, TSH, urine ALB.  Return in 3 months.

## 2021-05-11 NOTE — Assessment & Plan Note (Signed)
Chronic, ongoing, followed by cardiology -- is on medication treatment for this including Farxiga for both heart and diabetes.  Recommend she continue all medications as ordered.  Recommend: - Reminded to call for an overnight weight gain of >2 pounds or a weekly weight gain of >5 pounds - not adding salt to food and read food labels. Reviewed the importance of keeping daily sodium intake to <2000 MG daily.

## 2021-05-11 NOTE — Patient Instructions (Signed)

## 2021-05-11 NOTE — Assessment & Plan Note (Signed)
Chronic, ongoing with no recent flare.  Continue Allopurinol and adjust as needed.  Uric acid level today.

## 2021-05-11 NOTE — Assessment & Plan Note (Signed)
Chronic, ongoing.  Continue current medication regimen and adjust as needed.  Continue collaboration with nephrology.  CMP today and urine ALB today.

## 2021-05-11 NOTE — Assessment & Plan Note (Signed)
Chronic, ongoing, continue collaboration with cardiology and current medication regimen as prescribed by them.

## 2021-05-11 NOTE — Assessment & Plan Note (Signed)
BMI 55.13 with T2DM, HTN/HLD.  Recommended eating smaller high protein, low fat meals more frequently and exercising 30 mins a day 5 times a week with a goal of 10-15lb weight loss in the next 3 months. Patient voiced their understanding and motivation to adhere to these recommendations.

## 2021-05-11 NOTE — Assessment & Plan Note (Signed)
Chronic, ongoing.  Continue current medication regimen and adjust as needed.  Lipid panel and CMP today.    

## 2021-05-12 LAB — COMPREHENSIVE METABOLIC PANEL
ALT: 61 IU/L — ABNORMAL HIGH (ref 0–44)
AST: 42 IU/L — ABNORMAL HIGH (ref 0–40)
Albumin/Globulin Ratio: 1.6 (ref 1.2–2.2)
Albumin: 4.6 g/dL (ref 3.8–4.8)
Alkaline Phosphatase: 54 IU/L (ref 44–121)
BUN/Creatinine Ratio: 17 (ref 10–24)
BUN: 25 mg/dL (ref 8–27)
Bilirubin Total: 0.3 mg/dL (ref 0.0–1.2)
CO2: 24 mmol/L (ref 20–29)
Calcium: 9.4 mg/dL (ref 8.6–10.2)
Chloride: 102 mmol/L (ref 96–106)
Creatinine, Ser: 1.45 mg/dL — ABNORMAL HIGH (ref 0.76–1.27)
Globulin, Total: 2.9 g/dL (ref 1.5–4.5)
Glucose: 116 mg/dL — ABNORMAL HIGH (ref 70–99)
Potassium: 4.6 mmol/L (ref 3.5–5.2)
Sodium: 140 mmol/L (ref 134–144)
Total Protein: 7.5 g/dL (ref 6.0–8.5)
eGFR: 53 mL/min/{1.73_m2} — ABNORMAL LOW (ref >59)

## 2021-05-12 LAB — LIPID PANEL W/O CHOL/HDL RATIO
Cholesterol, Total: 144 mg/dL (ref 100–199)
HDL: 40 mg/dL (ref >39)
LDL Chol Calc (NIH): 55 mg/dL (ref 0–99)
Triglycerides: 316 mg/dL — ABNORMAL HIGH (ref 0–149)
VLDL Cholesterol Cal: 49 mg/dL — ABNORMAL HIGH (ref 5–40)

## 2021-05-12 LAB — CBC WITH DIFFERENTIAL/PLATELET
Basophils Absolute: 0.1 10*3/uL (ref 0.0–0.2)
Basos: 1 %
EOS (ABSOLUTE): 0.3 10*3/uL (ref 0.0–0.4)
Eos: 3 %
Hematocrit: 42.2 % (ref 37.5–51.0)
Hemoglobin: 13.6 g/dL (ref 13.0–17.7)
Immature Grans (Abs): 0 10*3/uL (ref 0.0–0.1)
Immature Granulocytes: 1 %
Lymphocytes Absolute: 2.2 10*3/uL (ref 0.7–3.1)
Lymphs: 25 %
MCH: 28 pg (ref 26.6–33.0)
MCHC: 32.2 g/dL (ref 31.5–35.7)
MCV: 87 fL (ref 79–97)
Monocytes Absolute: 0.9 10*3/uL (ref 0.1–0.9)
Monocytes: 10 %
Neutrophils Absolute: 5.3 10*3/uL (ref 1.4–7.0)
Neutrophils: 60 %
Platelets: 284 10*3/uL (ref 150–450)
RBC: 4.86 x10E6/uL (ref 4.14–5.80)
RDW: 14 % (ref 11.6–15.4)
WBC: 8.7 10*3/uL (ref 3.4–10.8)

## 2021-05-12 LAB — TSH: TSH: 1.6 u[IU]/mL (ref 0.450–4.500)

## 2021-05-12 LAB — PSA: Prostate Specific Ag, Serum: <0.1 ng/mL (ref 0.0–4.0)

## 2021-05-12 LAB — URIC ACID: Uric Acid: 7.7 mg/dL (ref 3.8–8.4)

## 2021-05-12 NOTE — Progress Notes (Signed)
Contacted via MyChart   Good evening [name], your labs have returned: - CMP shows ongoing mild kidney disease stage 3a.  This is stable and not worsening (creatinine and eGFR).   - Liver function, AST and ALT, is slightly more elevated this check then it was previously.  Do you drink any alcohol at home or take Tylenol often?  If so I recommend cutting back on both.  We should recheck in future to ensure no trend up on these. - CBC shows no anemia or infection - Cholesterol levels show LDL at goal, but triglycerides remain slightly elevated -- focus heavily on diet changes.  Fish is good at helping lower these. - Thyroid, prostate, and uric acid levels are normal.  Any questions? Keep being amazing!!  Thank you for allowing me to participate in your care.  I appreciate you. Kindest regards, Naomy Esham

## 2021-05-18 DIAGNOSIS — Z952 Presence of prosthetic heart valve: Secondary | ICD-10-CM | POA: Diagnosis not present

## 2021-05-18 DIAGNOSIS — Z7901 Long term (current) use of anticoagulants: Secondary | ICD-10-CM | POA: Diagnosis not present

## 2021-05-18 LAB — POCT INR: INR: 3.7 — AB (ref 2.0–3.0)

## 2021-06-05 LAB — PROTIME-INR: INR: 2.8 — AB (ref 0.9–1.1)

## 2021-06-21 DIAGNOSIS — Z952 Presence of prosthetic heart valve: Secondary | ICD-10-CM | POA: Diagnosis not present

## 2021-06-21 DIAGNOSIS — Z7901 Long term (current) use of anticoagulants: Secondary | ICD-10-CM | POA: Diagnosis not present

## 2021-06-25 ENCOUNTER — Other Ambulatory Visit: Payer: Self-pay | Admitting: Family

## 2021-06-25 ENCOUNTER — Other Ambulatory Visit (HOSPITAL_COMMUNITY): Payer: Self-pay | Admitting: Cardiology

## 2021-06-25 ENCOUNTER — Other Ambulatory Visit: Payer: Self-pay | Admitting: Family Medicine

## 2021-06-25 DIAGNOSIS — I5022 Chronic systolic (congestive) heart failure: Secondary | ICD-10-CM

## 2021-06-25 NOTE — Telephone Encounter (Signed)
Please see script- dx code needed/  Requested Prescriptions  Pending Prescriptions Disp Refills   albuterol (PROVENTIL) (2.5 MG/3ML) 0.083% nebulizer solution [Pharmacy Med Name: ALBUTEROL 0.083% 3ML NEB] 150 mL 0    Sig: USE 1 VIAL IN NEBULIZER EVERY 6 HOURS AS NEEDED FOR WHEEZING FOR SHORTNESS OF BREATH     Pulmonology:  Beta Agonists Failed - 06/25/2021 12:32 PM      Failed - One inhaler should last at least one month. If the patient is requesting refills earlier, contact the patient to check for uncontrolled symptoms.      Passed - Valid encounter within last 12 months    Recent Outpatient Visits           1 month ago Diabetes mellitus type 2, diet-controlled ([place])   [place], [place] T, NP   5 months ago Diabetes mellitus type 2, diet-controlled ([place])   [place], [name], DO   8 months ago Diabetes mellitus type 2, diet-controlled ([place])   [place], Gore, DO   1 year ago Essential hypertension   [place], Fellows P, DO   1 year ago Seasonal allergic rhinitis due to pollen   [facility], Barb Merino, DO       Future Appointments             In 3 weeks End, Harrell Gave, MD [facility], [facility]   In 3 weeks  [facility], [place]   In 1 month [place], Barb Merino, DO [facility], [place]

## 2021-06-25 NOTE — Telephone Encounter (Signed)
Requested Prescriptions  Pending Prescriptions Disp Refills   VENTOLIN HFA 108 (90 Base) MCG/ACT inhaler [Pharmacy Med Name: Ventolin HFA 108 (90 Base) MCG/ACT Inhalation Aerosol Solution] 54 g 0    Sig: INHALE 2 PUFFS BY MOUTH EVERY 6 HOURS AS NEEDED FOR WHEEZING OR SHORTNESS OF BREATH     Pulmonology:  Beta Agonists Failed - 06/25/2021  9:40 AM      Failed - One inhaler should last at least one month. If the patient is requesting refills earlier, contact the patient to check for uncontrolled symptoms.      Passed - Valid encounter within last 12 months    Recent Outpatient Visits          1 month ago Diabetes mellitus type 2, diet-controlled ([place])   [place], [place] T, NP   5 months ago Diabetes mellitus type 2, diet-controlled ([place])   [place], [name], DO   8 months ago Diabetes mellitus type 2, diet-controlled ([place])   [place], [place], DO   1 year ago Essential hypertension   [place], [place], DO   1 year ago Seasonal allergic rhinitis due to pollen   [facility], Barb Merino, DO      Future Appointments            In 3 weeks End, Harrell Gave, MD [facility], [facility]   In 3 weeks  [facility], [facility]   In 1 month [name], [name], DO [place], [facility]            albuterol (PROVENTIL) (2.5 MG/3ML) 0.083% nebulizer solution [employer] Med Name: Albuterol Sulfate (2.5 MG/3ML) 0.083% Inhalation Nebulization Solution] 150 mL 0    Sig: USE 1 VIAL IN NEBULIZER EVERY 6 HOURS AS NEEDED FOR WHEEZING OR SHORTNESS OF BREATH     Pulmonology:  Beta Agonists Failed - 06/25/2021  9:40 AM      Failed - One inhaler should last at least one month. If the patient is requesting refills earlier, contact the patient to check for uncontrolled symptoms.      Passed - Valid encounter within last 12 months    Recent Outpatient Visits           1 month ago Diabetes mellitus type 2, diet-controlled ([place])   [place], [place] T, NP   5 months ago Diabetes mellitus type 2, diet-controlled ([place])   [place], [name], DO   8 months ago Diabetes mellitus type 2, diet-controlled ([place])   [place], [place], DO   1 year ago Essential hypertension   [place], [place] P, DO   1 year ago Seasonal allergic rhinitis due to pollen   [facility], Barb Merino, DO      Future Appointments            In 3 weeks End, Harrell Gave, MD [facility], [facility]   In 3 weeks  [facility] [facility], [place]   In 1 month [place], Barb Merino, DO [facility] [facility], [place]

## 2021-06-29 LAB — POCT INR: INR: 2.8 (ref 2.0–3.0)

## 2021-07-07 ENCOUNTER — Encounter: Payer: Self-pay | Admitting: Family Medicine

## 2021-07-07 LAB — POCT INR: INR: 3 (ref 2.0–3.0)

## 2021-07-11 ENCOUNTER — Other Ambulatory Visit: Payer: Self-pay

## 2021-07-11 ENCOUNTER — Other Ambulatory Visit: Payer: Self-pay | Admitting: Family Medicine

## 2021-07-11 ENCOUNTER — Telehealth: Payer: Self-pay | Admitting: Family Medicine

## 2021-07-11 MED ORDER — ALBUTEROL SULFATE (2.5 MG/3ML) 0.083% IN NEBU: INHALATION_SOLUTION | RESPIRATORY_TRACT | 0 refills | Status: DC

## 2021-07-11 MED ORDER — FUROSEMIDE 40 MG PO TABS: ORAL_TABLET | ORAL | 0 refills | Status: DC

## 2021-07-11 NOTE — Telephone Encounter (Signed)
Requested Prescriptions  Pending Prescriptions Disp Refills   allopurinol (ZYLOPRIM) 100 MG tablet [Pharmacy Med Name: Allopurinol 100 MG Oral Tablet] 90 tablet 0    Sig: Take 1 tablet by mouth once daily     Endocrinology:  Gout Agents Failed - 07/11/2021 10:33 AM      Failed - Cr in normal range and within 360 days    Creatinine  Date Value Ref Range Status  05/25/2014 0.85 0.60 - 1.30 mg/dL Final   Creatinine, Ser  Date Value Ref Range Status  05/11/2021 1.45 (H) 0.76 - 1.27 mg/dL Final         Passed - Uric Acid in normal range and within 360 days    Uric Acid  Date Value Ref Range Status  05/11/2021 7.7 3.8 - 8.4 mg/dL Final    Comment:               Therapeutic target for gout patients: <6.0         Passed - Valid encounter within last 12 months    Recent Outpatient Visits          2 months ago Diabetes mellitus type 2, diet-controlled ([place])   [place], Jolene T, NP   5 months ago Diabetes mellitus type 2, diet-controlled ([place])   [place], [name], DO   8 months ago Diabetes mellitus type 2, diet-controlled ([place])   [place], [place], DO   1 year ago Essential hypertension   [place], [name], DO   1 year ago Seasonal allergic rhinitis due to pollen   [facility], Barb Merino, DO      Future Appointments            In 1 week End, Harrell Gave, MD [facility], [facility]   In 1 week  [facility], [facility]   In 1 month [name], [name], DO [place], [facility]            fenofibrate (TRICOR) 48 MG tablet [employer] Med Name: Fenofibrate 48 MG Oral Tablet] 90 tablet 0    Sig: Take 1 tablet by mouth once daily     Cardiovascular:  Antilipid - Fibric Acid Derivatives Failed - 07/11/2021 10:33 AM      Failed - Triglycerides in normal range and within 360 days    Triglycerides  Date Value Ref Range Status  05/11/2021 316  (H) 0 - 149 mg/dL Final  05/25/2014 167 0 - 200 mg/dL Final   Triglycerides Piccolo,Waived  Date Value Ref Range Status  05/17/2017 361 (H) <150 mg/dL Final    Comment:                            Normal                   <150                         Borderline High     150 - 199                         High                200 - 499  Very High                >499          Failed - ALT in normal range and within 180 days    ALT  Date Value Ref Range Status  05/11/2021 61 (H) 0 - 44 IU/L Final   SGPT (ALT)  Date Value Ref Range Status  05/24/2014 20 U/L Final    Comment:    14-63 NOTE: New Reference Range 01/20/14    ALT (SGPT) Piccolo, [place]  Date Value Ref Range Status  05/17/2017 47 10 - 47 U/L Final         Failed - AST in normal range and within 180 days    AST  Date Value Ref Range Status  05/11/2021 42 (H) 0 - 40 IU/L Final   SGOT(AST)  Date Value Ref Range Status  05/24/2014 22 15 - 37 Unit/L Final   AST (SGOT) Piccolo, Waived  Date Value Ref Range Status  05/17/2017 31 11 - 38 U/L Final         Failed - Cr in normal range and within 180 days    Creatinine  Date Value Ref Range Status  05/25/2014 0.85 0.60 - 1.30 mg/dL Final   Creatinine, Ser  Date Value Ref Range Status  05/11/2021 1.45 (H) 0.76 - 1.27 mg/dL Final         Failed - eGFR in normal range and within 180 days    EGFR (African American)  Date Value Ref Range Status  05/25/2014 >60 >48mL/min Final   GFR calc Af Amer  Date Value Ref Range Status  05/12/2020 64 >59 mL/min/1.73 Final    Comment:    **In accordance with recommendations from the NKF-ASN Task force,**   [facility] is in the process of updating its eGFR calculation to the   2021 CKD-EPI creatinine equation that estimates kidney function   without a race variable.    EGFR (Non-African Amer.)  Date Value Ref Range Status  05/25/2014 >60 >105mL/min Final    Comment:    eGFR values <62mL/min/1.73 m2  may be an indication of chronic kidney disease (CKD). Calculated eGFR, using the MRDR Study equation, is useful in  patients with stable renal function. The eGFR calculation will not be reliable in acutely ill patients when serum creatinine is changing rapidly. It is not useful in patients on dialysis. The eGFR calculation may not be applicable to patients at the low and high extremes of body sizes, pregnant women, and vegetarians.    GFR, Estimated  Date Value Ref Range Status  08/26/2020 51 (L) >60 mL/min Final    Comment:    (NOTE) Calculated using the CKD-EPI Creatinine Equation (2021)    eGFR  Date Value Ref Range Status  05/11/2021 53 (L) >59 mL/min/1.73 Final         Passed - Total Cholesterol in normal range and within 360 days    Cholesterol, Total  Date Value Ref Range Status  05/11/2021 144 100 - 199 mg/dL Final   Cholesterol  Date Value Ref Range Status  05/25/2014 131 0 - 200 mg/dL Final   Cholesterol Piccolo, Waived  Date Value Ref Range Status  05/17/2017 177 <200 mg/dL Final    Comment:                            Desirable                <  200                         Borderline High      200- 239                         High                     >239          Passed - LDL in normal range and within 360 days    Ldl Cholesterol, Calc  Date Value Ref Range Status  05/25/2014 68 0 - 100 mg/dL Final   LDL Chol Calc (NIH)  Date Value Ref Range Status  05/11/2021 55 0 - 99 mg/dL Final   Direct LDL  Date Value Ref Range Status  06/04/2019 96.4 0 - 99 mg/dL Final    Comment:    Performed at [facility] [address], [place], [place] [address]         Passed - HDL in normal range and within 360 days    HDL Cholesterol  Date Value Ref Range Status  05/25/2014 30 (L) 40 - 60 mg/dL Final   HDL  Date Value Ref Range Status  05/11/2021 40 >39 mg/dL Final         Passed - Valid encounter within last 12 months    Recent Outpatient Visits           2 months ago Diabetes mellitus type 2, diet-controlled ([place])   [place], [place] T, NP   5 months ago Diabetes mellitus type 2, diet-controlled ([place])   [place], [name], DO   8 months ago Diabetes mellitus type 2, diet-controlled ([place])   [place], [place], DO   1 year ago Essential hypertension   [place], [name], DO   1 year ago Seasonal allergic rhinitis due to pollen   [facility], Barb Merino, DO      Future Appointments            In 1 week End, Harrell Gave, MD [facility], [facility]   In 1 week  [facility], [facility]   In 1 month [name], [name], DO [place], [facility]            metFORMIN (GLUCOPHAGE-XR) 500 MG 24 hr tablet [employer] Med Name: metFORMIN HCl ER 500 MG Oral Tablet Extended Release 24 Hour] 90 tablet 0    Sig: Take 1 tablet by mouth once daily with breakfast     Endocrinology:  Diabetes - Biguanides Failed - 07/11/2021 10:33 AM      Failed - Cr in normal range and within 360 days    Creatinine  Date Value Ref Range Status  05/25/2014 0.85 0.60 - 1.30 mg/dL Final   Creatinine, Ser  Date Value Ref Range Status  05/11/2021 1.45 (H) 0.76 - 1.27 mg/dL Final         Failed - eGFR in normal range and within 360 days    EGFR (African American)  Date Value Ref Range Status  05/25/2014 >60 >25mL/min Final   GFR calc Af Amer  Date Value Ref Range Status  05/12/2020 64 >59 mL/min/1.73 Final    Comment:    **In accordance with recommendations from the NKF-ASN Task force,**   [facility] is in the process of updating its  eGFR calculation to the   2021 CKD-EPI creatinine equation that estimates kidney function   without a race variable.    EGFR (Non-African Amer.)  Date Value Ref Range Status  05/25/2014 >60 >44mL/min Final    Comment:    eGFR values <62mL/min/1.73 m2 may be an indication of  chronic kidney disease (CKD). Calculated eGFR, using the MRDR Study equation, is useful in  patients with stable renal function. The eGFR calculation will not be reliable in acutely ill patients when serum creatinine is changing rapidly. It is not useful in patients on dialysis. The eGFR calculation may not be applicable to patients at the low and high extremes of body sizes, pregnant women, and vegetarians.    GFR, Estimated  Date Value Ref Range Status  08/26/2020 51 (L) >60 mL/min Final    Comment:    (NOTE) Calculated using the CKD-EPI Creatinine Equation (2021)    eGFR  Date Value Ref Range Status  05/11/2021 53 (L) >59 mL/min/1.73 Final         Passed - HBA1C is between 0 and 7.9 and within 180 days    Hemoglobin A1C  Date Value Ref Range Status  04/19/2016 6.6  Final   HB A1C (BAYER DCA - WAIVED)  Date Value Ref Range Status  05/11/2021 6.5 (H) 4.8 - 5.6 % Final    Comment:             Prediabetes: 5.7 - 6.4          Diabetes: >6.4          Glycemic control for adults with diabetes: <7.0          Passed - Valid encounter within last 6 months    Recent Outpatient Visits          2 months ago Diabetes mellitus type 2, diet-controlled ([place])   [place], [place] T, NP   5 months ago Diabetes mellitus type 2, diet-controlled ([place])   [place], [name], DO   8 months ago Diabetes mellitus type 2, diet-controlled ([place])   [place], [place], DO   1 year ago Essential hypertension   [place], [place], DO   1 year ago Seasonal allergic rhinitis due to pollen   [facility], Barb Merino, DO      Future Appointments            In 1 week End, Harrell Gave, MD [facility], [facility]   In 1 week  [facility] [facility], [facility]   In 1 month [name], [name], DO [facility], [facility]            albuterol (VENTOLIN HFA) 108 (90 Base)  MCG/ACT inhaler [Pharmacy Med Name: Ventolin HFA 108 (90 Base) MCG/ACT Inhalation Aerosol Solution] 54 g 0    Sig: INHALE 2 PUFFS EVERY 6 HOURS AS NEEDED FOR WHEEZING FOR SHORTNESS OF BREATH     Pulmonology:  Beta Agonists Failed - 07/11/2021 10:33 AM      Failed - One inhaler should last at least one month. If the patient is requesting refills earlier, contact the patient to check for uncontrolled symptoms.      Passed - Valid encounter within last 12 months    Recent Outpatient Visits          2 months ago Diabetes mellitus type 2, diet-controlled ([place])   [place], [place] T, NP   5 months  ago Diabetes mellitus type 2, diet-controlled ([place])   [place], [name], DO   8 months ago Diabetes mellitus type 2, diet-controlled ([place])   [place], [place], DO   1 year ago Essential hypertension   [place], [place], DO   1 year ago Seasonal allergic rhinitis due to pollen   [facility], Barb Merino, DO      Future Appointments            In 1 week End, Harrell Gave, MD [facility], [facility]   In 1 week  [facility], [place]   In 1 month [place], Barb Merino, DO [employer] [facility], [place]

## 2021-07-11 NOTE — Telephone Encounter (Signed)
Rx was sent over previously with ICD10. I have resent it.

## 2021-07-11 NOTE — Telephone Encounter (Signed)
*  STAT* If patient is at the pharmacy, call can be transferred to refill team.   1. Which medications need to be refilled? (please list name of each medication and dose if known) [place]  2. Which pharmacy/location (including street and city if local pharmacy) is medication to be sent to? [place]  3. Do they need a 30 day or 90 day supply? [place]

## 2021-07-11 NOTE — Telephone Encounter (Signed)
Wells Guiles calling from [facility] is calling to state that albuterol (PROVENTIL) (2.5 MG/3ML) 0.083% nebulizer solution [[id number]] has ICD code. Please resubmit ICD code Please advise CB- [phone]

## 2021-07-11 NOTE — Telephone Encounter (Signed)
Vangie Bicker is at the pharmacy to pick up pts medications from before the new year and there was an issue with an ICD code and the pharmacy stated they have sent request for other meds with no response / please advise asap

## 2021-07-11 NOTE — Telephone Encounter (Signed)
Requested medication (s) are due for refill today:NO, just filled 06/25/21  Requested medication (s) are on the active medication list: yes  Last refill:  06/25/21  Future visit scheduled: 08/11/21  Notes to clinic:  Protocol to let provider know if pt asking earlier than one month, please assess. (The rx that was ordered today already was for the nebulizer, not inhaler.  Requested Prescriptions  Pending Prescriptions Disp Refills   albuterol (VENTOLIN HFA) 108 (90 Base) MCG/ACT inhaler 54 g 0    Sig: INHALE 2 PUFFS EVERY 6 HOURS AS NEEDED FOR WHEEZING FOR SHORTNESS OF BREATH     Pulmonology:  Beta Agonists Failed - 07/11/2021 10:33 AM      Failed - One inhaler should last at least one month. If the patient is requesting refills earlier, contact the patient to check for uncontrolled symptoms.      Passed - Valid encounter within last 12 months    Recent Outpatient Visits           2 months ago Diabetes mellitus type 2, diet-controlled ([place])   [place], [place]on T, NP   5 months ago Diabetes mellitus type 2, diet-controlled ([place])   [place], [name], DO   8 months ago Diabetes mellitus type 2, diet-controlled ([place])   [place], [name], DO   1 year ago Essential hypertension   [place], Stephens P, DO   1 year ago Seasonal allergic rhinitis due to pollen   [facility], Barb Merino, DO       Future Appointments             In 1 week End, Harrell Gave, MD [facility], [facility]   In 1 week  [facility], [facility]   In 1 month Leon, [name], DO [facility], [facility]            Signed Prescriptions Disp Refills   allopurinol (ZYLOPRIM) 100 MG tablet 90 tablet 0    Sig: Take 1 tablet by mouth once daily     Endocrinology:  Gout Agents Failed - 07/11/2021 10:33 AM      Failed - Cr in normal range and within 360 days    Creatinine  Date  Value Ref Range Status  05/25/2014 0.85 0.60 - 1.30 mg/dL Final   Creatinine, Ser  Date Value Ref Range Status  05/11/2021 1.45 (H) 0.76 - 1.27 mg/dL Final          Passed - Uric Acid in normal range and within 360 days    Uric Acid  Date Value Ref Range Status  05/11/2021 7.7 3.8 - 8.4 mg/dL Final    Comment:               Therapeutic target for gout patients: <6.0          Passed - Valid encounter within last 12 months    Recent Outpatient Visits           2 months ago Diabetes mellitus type 2, diet-controlled ([place])   [place], Jolene T, NP   5 months ago Diabetes mellitus type 2, diet-controlled ([place])   [place], [name], DO   8 months ago Diabetes mellitus type 2, diet-controlled ([place])   [place], [name], DO   1 year ago Essential hypertension   [place], [place]uitman, DO   1 year ago Seasonal  allergic rhinitis due to pollen   [facility], Oralia Rud, DO       Future Appointments             In 1 week End, Cristal Deer, MD [facility], [facility]   In 1 week  [facility], [facility]   In 1 month [place], [name], DO [facility], [facility]             fenofibrate (TRICOR) 48 MG tablet 90 tablet 0    Sig: Take 1 tablet by mouth once daily     Cardiovascular:  Antilipid - Fibric Acid Derivatives Failed - 07/11/2021 10:33 AM      Failed - Triglycerides in normal range and within 360 days    Triglycerides  Date Value Ref Range Status  05/11/2021 316 (H) 0 - 149 mg/dL Final  73/67/7957 346 0 - 200 mg/dL Final   Triglycerides Piccolo,Waived  Date Value Ref Range Status  05/17/2017 361 (H) <150 mg/dL Final    Comment:                            Normal                   <150                         Borderline High     150 - 199                         High                200 - 499                         Very High                 >499           Failed - ALT in normal range and within 180 days    ALT  Date Value Ref Range Status  05/11/2021 61 (H) 0 - 44 IU/L Final   SGPT (ALT)  Date Value Ref Range Status  05/24/2014 20 U/L Final    Comment:    14-63 NOTE: New Reference Range 01/20/14    ALT (SGPT) Piccolo, Waived  Date Value Ref Range Status  05/17/2017 47 10 - 47 U/L Final          Failed - AST in normal range and within 180 days    AST  Date Value Ref Range Status  05/11/2021 42 (H) 0 - 40 IU/L Final   SGOT(AST)  Date Value Ref Range Status  05/24/2014 22 15 - 37 Unit/L Final   AST (SGOT) Piccolo, Waived  Date Value Ref Range Status  05/17/2017 31 11 - 38 U/L Final          Failed - Cr in normal range and within 180 days    Creatinine  Date Value Ref Range Status  05/25/2014 0.85 0.60 - 1.30 mg/dL Final   Creatinine, Ser  Date Value Ref Range Status  05/11/2021 1.45 (H) 0.76 - 1.27 mg/dL Final          Failed - eGFR in normal range and within 180 days    EGFR (African American)  Date Value Ref Range Status  05/25/2014 >60 >87mL/min Final  GFR calc Af Amer  Date Value Ref Range Status  05/12/2020 64 >59 mL/min/1.73 Final    Comment:    **In accordance with recommendations from the NKF-ASN Task force,**   [facility] is in the process of updating its eGFR calculation to the   2021 CKD-EPI creatinine equation that estimates kidney function   without a race variable.    EGFR (Non-African Amer.)  Date Value Ref Range Status  05/25/2014 >60 >7mL/min Final    Comment:    eGFR values <64mL/min/1.73 m2 may be an indication of chronic kidney disease (CKD). Calculated eGFR, using the MRDR Study equation, is useful in  patients with stable renal function. The eGFR calculation will not be reliable in acutely ill patients when serum creatinine is changing rapidly. It is not useful in patients on dialysis. The eGFR calculation may not be applicable to patients at the low and  high extremes of body sizes, pregnant women, and vegetarians.    GFR, Estimated  Date Value Ref Range Status  08/26/2020 51 (L) >60 mL/min Final    Comment:    (NOTE) Calculated using the CKD-EPI Creatinine Equation (2021)    eGFR  Date Value Ref Range Status  05/11/2021 53 (L) >59 mL/min/1.73 Final          Passed - Total Cholesterol in normal range and within 360 days    Cholesterol, Total  Date Value Ref Range Status  05/11/2021 144 100 - 199 mg/dL Final   Cholesterol  Date Value Ref Range Status  05/25/2014 131 0 - 200 mg/dL Final   Cholesterol Piccolo, Waived  Date Value Ref Range Status  05/17/2017 177 <200 mg/dL Final    Comment:                            Desirable                <200                         Borderline High      200- 239                         High                     >239           Passed - LDL in normal range and within 360 days    Ldl Cholesterol, Calc  Date Value Ref Range Status  05/25/2014 68 0 - 100 mg/dL Final   LDL Chol Calc (NIH)  Date Value Ref Range Status  05/11/2021 55 0 - 99 mg/dL Final   Direct LDL  Date Value Ref Range Status  06/04/2019 96.4 0 - 99 mg/dL Final    Comment:    Performed at [facility] [address] [place], [place] [address]          Passed - HDL in normal range and within 360 days    HDL Cholesterol  Date Value Ref Range Status  05/25/2014 30 (L) 40 - 60 mg/dL Final   HDL  Date Value Ref Range Status  05/11/2021 40 >39 mg/dL Final          Passed - Valid encounter within last 12 months    Recent Outpatient Visits           2 months  ago Diabetes mellitus type 2, diet-controlled ([place])   [place], Garland T, NP   5 months ago Diabetes mellitus type 2, diet-controlled ([place])   [place], [name], DO   8 months ago Diabetes mellitus type 2, diet-controlled ([place])   [place], [place], DO   1 year ago Essential  hypertension   [place], [place] P, DO   1 year ago Seasonal allergic rhinitis due to pollen   [facility], Barb Merino, DO       Future Appointments             In 1 week End, Harrell Gave, MD [facility], [facility]   In 1 week  [facility], [facility]   In 1 month [name], [name], DO [facility]             metFORMIN (GLUCOPHAGE-XR) 500 MG 24 hr tablet 90 tablet 0    Sig: Take 1 tablet by mouth once daily with breakfast     Endocrinology:  Diabetes - Biguanides Failed - 07/11/2021 10:33 AM      Failed - Cr in normal range and within 360 days    Creatinine  Date Value Ref Range Status  05/25/2014 0.85 0.60 - 1.30 mg/dL Final   Creatinine, Ser  Date Value Ref Range Status  05/11/2021 1.45 (H) 0.76 - 1.27 mg/dL Final          Failed - eGFR in normal range and within 360 days    EGFR (African American)  Date Value Ref Range Status  05/25/2014 >60 >57mL/min Final   GFR calc Af Amer  Date Value Ref Range Status  05/12/2020 64 >59 mL/min/1.73 Final    Comment:    **In accordance with recommendations from the NKF-ASN Task force,**   [facility] is in the process of updating its eGFR calculation to the   2021 CKD-EPI creatinine equation that estimates kidney function   without a race variable.    EGFR (Non-African Amer.)  Date Value Ref Range Status  05/25/2014 >60 >71mL/min Final    Comment:    eGFR values <2mL/min/1.73 m2 may be an indication of chronic kidney disease (CKD). Calculated eGFR, using the MRDR Study equation, is useful in  patients with stable renal function. The eGFR calculation will not be reliable in acutely ill patients when serum creatinine is changing rapidly. It is not useful in patients on dialysis. The eGFR calculation may not be applicable to patients at the low and high extremes of body sizes, pregnant women, and vegetarians.    GFR, Estimated  Date Value Ref  Range Status  08/26/2020 51 (L) >60 mL/min Final    Comment:    (NOTE) Calculated using the CKD-EPI Creatinine Equation (2021)    eGFR  Date Value Ref Range Status  05/11/2021 53 (L) >59 mL/min/1.73 Final          Passed - HBA1C is between 0 and 7.9 and within 180 days    Hemoglobin A1C  Date Value Ref Range Status  04/19/2016 6.6  Final   HB A1C (BAYER DCA - WAIVED)  Date Value Ref Range Status  05/11/2021 6.5 (H) 4.8 - 5.6 % Final    Comment:             Prediabetes: 5.7 - 6.4          Diabetes: >6.4          Glycemic control  for adults with diabetes: <7.0           Passed - Valid encounter within last 6 months    Recent Outpatient Visits           2 months ago Diabetes mellitus type 2, diet-controlled ([place])   [place], [place] T, NP   5 months ago Diabetes mellitus type 2, diet-controlled ([place])   [place], [name], DO   8 months ago Diabetes mellitus type 2, diet-controlled ([place])   [place], [place], DO   1 year ago Essential hypertension   [place], [place] P, DO   1 year ago Seasonal allergic rhinitis due to pollen   [facility], Barb Merino, DO       Future Appointments             In 1 week End, Harrell Gave, MD [facility], [facility]   In 1 week  [facility], [place]   In 1 month [place], Barb Merino, DO [facility], [facility]

## 2021-07-11 NOTE — Telephone Encounter (Signed)
RX failed to send, can we resend please?

## 2021-07-15 LAB — PROTIME-INR: INR: 3 — AB (ref 0.9–1.1)

## 2021-07-18 NOTE — Progress Notes (Deleted)
Follow-up Outpatient Visit Date: 07/20/2021  Primary Care Provider: Valerie Roys, DO [address] [place] [address]  Chief Complaint: ***  HPI:  Roberto [name] is a 66 y.o. male with history of mechanical aortic valve replacement, non-obstructive coronary artery disease, thoracic aortic aneurysm (followed by [facility]), stroke, right renal infarct, chronic systolic heart failure (LVEF 40-45%), hyperlipidemia, obstructive sleep apnea not using CPAP, and morbid obesity, who presents for follow-up of heart failure, valvular heart disease, and thoracic aortic aneurysm.  I last saw him in late October, at which time he was feeling well.  He was exercising more again following a 1 month hiatus due to a gout flare.  We did not make any medication changes or pursue additional testing.  --------------------------------------------------------------------------------------------------  Past Medical History:  Diagnosis Date   Bicuspid aortic valve    a. s/p #27 Carbomedics mechanical valve on 03/25/2010; b. on Coumadin; c. TTE 12/17: EF 40-45%, moderately dilated LV with moderate LVH, AVR well-seated with 14 mmHg gradient, peak AV velocity 2.5 m/s, mild mitral valve thickening with mild MR, mildly dilated RV with mildly reduced contraction   Cellulitis    Chronic systolic CHF (congestive heart failure) ([place])    a. R/LHC 03/2010 showed no significant CAD, LVEDP 31 mmHg, mean AoV gradient 34 mmHg at rest and 47 mmHg with dobutamine 20 mcg/kg/min, AVA 1.0 cm^2, RA 31, RV 68/25, PA 68/47, PCWP 38. PA sat 65%. CO 6.2 L/min ([place]) and 5.3 L/min (thermodilution)   Clotting disorder (HCC)    H/O mechanical aortic valve replacement 03/25/2010   a. #27 Carbomedics mechanical valve   Hypercholesterolemia    Renal infarct [facility]) 2017   Multiple right renal infarcts, likely embolic.   Stroke [facility])    TIA (transient ischemic attack) 05/2014   Past Surgical History:  Procedure Laterality Date   AORTIC VALVE  REPLACEMENT     CARDIAC CATHETERIZATION  03/21/2010   No significant CAD. Severe aortic stenosis. Severely elevated left and right heart filling pressures.   CARDIAC SURGERY  2009   CHF   CARPAL TUNNEL RELEASE Left 2005   RIGHT HEART CATH AND CORONARY ANGIOGRAPHY N/A 01/28/2019   Procedure: RIGHT HEART CATH AND CORONARY ANGIOGRAPHY;  Surgeon: Nelva Bush, MD;  Location: [facility];  Service: Cardiovascular;  Laterality: N/A;   TONSILLECTOMY  1962    No outpatient medications have been marked as taking for the 07/20/21 encounter (Appointment) with Kathleena Freeman, Harrell Gave, MD.    Allergies: Patient has no known allergies.  Social History   Tobacco Use   Smoking status: Former    Packs/day: 0.50    Years: 46.00    Pack years: 23.00    Types: Cigarettes    Start date: 07/16/2017    Quit date: 05/11/2019    Years since quitting: 2.1   Smokeless tobacco: Never  Vaping Use   Vaping Use: Never used  Substance Use Topics   Alcohol use: No    Alcohol/week: 0.0 standard drinks   Drug use: No    Family History  Problem Relation Age of Onset   Arthritis Mother    Dementia Mother    Colon cancer Mother    Arthritis Father    Diabetes Father    Stroke Father    Colon cancer Father    Heart attack Brother    Breast cancer Sister    Seizures Sister    Cancer Brother        brain   Heart disease Brother  Heart attack Brother     Review of Systems: A 12-system review of systems was performed and was negative except as noted in the HPI.  --------------------------------------------------------------------------------------------------  Physical Exam: There were no vitals taken for this visit.  General:  NAD. Neck: No JVD or HJR. Lungs: Clear to auscultation bilaterally without wheezes or crackles. Heart: Regular rate and rhythm without murmurs, rubs, or gallops. Abdomen: Soft, nontender, nondistended. Extremities: No lower extremity edema.  EKG:  ***  Lab  Results  Component Value Date   WBC 8.7 05/11/2021   HGB 13.6 05/11/2021   HCT 42.2 05/11/2021   MCV 87 05/11/2021   PLT 284 05/11/2021    Lab Results  Component Value Date   NA 140 05/11/2021   K 4.6 05/11/2021   CL 102 05/11/2021   CO2 24 05/11/2021   BUN 25 05/11/2021   CREATININE 1.45 (H) 05/11/2021   GLUCOSE 116 (H) 05/11/2021   ALT 61 (H) 05/11/2021    Lab Results  Component Value Date   CHOL 144 05/11/2021   HDL 40 05/11/2021   LDLCALC 55 05/11/2021   LDLDIRECT 96.4 06/04/2019   TRIG 316 (H) 05/11/2021   CHOLHDL 4.3 08/08/2019    --------------------------------------------------------------------------------------------------  ASSESSMENT AND PLAN: Harrell Gave Itay Mella, MD 07/18/2021 8:27 AM

## 2021-07-19 ENCOUNTER — Telehealth: Payer: Self-pay | Admitting: Internal Medicine

## 2021-07-19 ENCOUNTER — Other Ambulatory Visit: Payer: Self-pay

## 2021-07-19 MED ORDER — LISINOPRIL 5 MG PO TABS: 5.0000 mg | ORAL_TABLET | Freq: Every day | ORAL | 3 refills | Status: DC

## 2021-07-19 NOTE — Telephone Encounter (Signed)
°*  STAT* If patient is at the pharmacy, call can be transferred to refill team.   1. Which medications need to be refilled? (please list name of each medication and dose if known) lisinopril 5 MG 1 tablet daily   2. Which pharmacy/location (including street and city if local pharmacy) is medication to be sent to? [place]  3. Do they need a 30 day or 90 day supply? 90 day

## 2021-07-19 NOTE — Telephone Encounter (Signed)
lisinopril (ZESTRIL) 5 MG tablet 90 tablet 3 07/19/2021    Sig - Route: Take 1 tablet (5 mg total) by mouth daily. - Oral    Pharmacy  [facility] [place] ([address]), [place]

## 2021-07-20 ENCOUNTER — Ambulatory Visit: Payer: Medicare Other | Admitting: Internal Medicine

## 2021-07-21 ENCOUNTER — Ambulatory Visit (INDEPENDENT_AMBULATORY_CARE_PROVIDER_SITE_OTHER): Payer: Medicare Other | Admitting: *Deleted

## 2021-07-21 DIAGNOSIS — Z0000 Encounter for general adult medical examination without abnormal findings: Secondary | ICD-10-CM

## 2021-07-21 NOTE — Patient Instructions (Signed)
[name] , Thank you for taking time to come for your Medicare Wellness Visit. I appreciate your ongoing commitment to your health goals. Please review the following plan we discussed and let me know if I can assist you in the future.   Screening recommendations/referrals: Colonoscopy: declined Recommended yearly ophthalmology/optometry visit for glaucoma screening and checkup Recommended yearly dental visit for hygiene and checkup  Vaccinations: Influenza vaccine: up to date Pneumococcal vaccine: up to date Tdap vaccine: up to date Shingles vaccine: declined    Advanced directives: Education provided  Conditions/risks identified:   Next appointment: 08-11-2021  @ 9:00   [facility] 67 Years and Older, Male Preventive care refers to lifestyle choices and visits with your health care provider that can promote health and wellness. What does preventive care include? A yearly physical exam. This is also called an annual well check. Dental exams once or twice a year. Routine eye exams. Ask your health care provider how often you should have your eyes checked. Personal lifestyle choices, including: Daily care of your teeth and gums. Regular physical activity. Eating a healthy diet. Avoiding tobacco and drug use. Limiting alcohol use. Practicing safe sex. Taking low doses of aspirin every day. Taking vitamin and mineral supplements as recommended by your health care provider. What happens during an annual well check? The services and screenings done by your health care provider during your annual well check will depend on your age, overall health, lifestyle risk factors, and family history of disease. Counseling  Your health care provider may ask you questions about your: Alcohol use. Tobacco use. Drug use. Emotional well-being. Home and relationship well-being. Sexual activity. Eating habits. History of falls. Memory and ability to understand (cognition). Work  and work Statistician. Screening  You may have the following tests or measurements: Height, weight, and BMI. Blood pressure. Lipid and cholesterol levels. These may be checked every 5 years, or more frequently if you are over 32 years old. Skin check. Lung cancer screening. You may have this screening every year starting at age 8 if you have a 30-pack-year history of smoking and currently smoke or have quit within the past 15 years. Fecal occult blood test (FOBT) of the stool. You may have this test every year starting at age 85. Flexible sigmoidoscopy or colonoscopy. You may have a sigmoidoscopy every 5 years or a colonoscopy every 10 years starting at age 53. Prostate cancer screening. Recommendations will vary depending on your family history and other risks. Hepatitis C blood test. Hepatitis B blood test. Sexually transmitted disease (STD) testing. Diabetes screening. This is done by checking your blood sugar (glucose) after you have not eaten for a while (fasting). You may have this done every 1-3 years. Abdominal aortic aneurysm (AAA) screening. You may need this if you are a current or former smoker. Osteoporosis. You may be screened starting at age 62 if you are at high risk. Talk with your health care provider about your test results, treatment options, and if necessary, the need for more tests. Vaccines  Your health care provider may recommend certain vaccines, such as: Influenza vaccine. This is recommended every year. Tetanus, diphtheria, and acellular pertussis (Tdap, Td) vaccine. You may need a Td booster every 10 years. Zoster vaccine. You may need this after age 67. Pneumococcal 13-valent conjugate (PCV13) vaccine. One dose is recommended after age 87. Pneumococcal polysaccharide (PPSV23) vaccine. One dose is recommended after age 12. Talk to your health care provider about which screenings and vaccines  you need and how often you need them. This information is not intended  to replace advice given to you by your health care provider. Make sure you discuss any questions you have with your health care provider. Document Released: 07/16/2015 Document Revised: 03/08/2016 Document Reviewed: 04/20/2015 Elsevier Interactive Patient Education  2017 [place] Prevention in the Home Falls can cause injuries. They can happen to people of all ages. There are many things you can do to make your home safe and to help prevent falls. What can I do on the outside of my home? Regularly fix the edges of walkways and driveways and fix any cracks. Remove anything that might make you trip as you walk through a door, such as a raised step or threshold. Trim any bushes or trees on the path to your home. Use bright outdoor lighting. Clear any walking paths of anything that might make someone trip, such as rocks or tools. Regularly check to see if handrails are loose or broken. Make sure that both sides of any steps have handrails. Any raised decks and porches should have guardrails on the edges. Have any leaves, snow, or ice cleared regularly. Use sand or salt on walking paths during winter. Clean up any spills in your garage right away. This includes oil or grease spills. What can I do in the bathroom? Use night lights. Install grab bars by the toilet and in the tub and shower. Do not use towel bars as grab bars. Use non-skid mats or decals in the tub or shower. If you need to sit down in the shower, use a plastic, non-slip stool. Keep the floor dry. Clean up any water that spills on the floor as soon as it happens. Remove soap buildup in the tub or shower regularly. Attach bath mats securely with double-sided non-slip rug tape. Do not have throw rugs and other things on the floor that can make you trip. What can I do in the bedroom? Use night lights. Make sure that you have a light by your bed that is easy to reach. Do not use any sheets or blankets that are too big  for your bed. They should not hang down onto the floor. Have a firm chair that has side arms. You can use this for support while you get dressed. Do not have throw rugs and other things on the floor that can make you trip. What can I do in the kitchen? Clean up any spills right away. Avoid walking on wet floors. Keep items that you use a lot in easy-to-reach places. If you need to reach something above you, use a strong step stool that has a grab bar. Keep electrical cords out of the way. Do not use floor polish or wax that makes floors slippery. If you must use wax, use non-skid floor wax. Do not have throw rugs and other things on the floor that can make you trip. What can I do with my stairs? Do not leave any items on the stairs. Make sure that there are handrails on both sides of the stairs and use them. Fix handrails that are broken or loose. Make sure that handrails are as long as the stairways. Check any carpeting to make sure that it is firmly attached to the stairs. Fix any carpet that is loose or worn. Avoid having throw rugs at the top or bottom of the stairs. If you do have throw rugs, attach them to the floor with carpet tape. Make sure  that you have a light switch at the top of the stairs and the bottom of the stairs. If you do not have them, ask someone to add them for you. What else can I do to help prevent falls? Wear shoes that: Do not have high heels. Have rubber bottoms. Are comfortable and fit you well. Are closed at the toe. Do not wear sandals. If you use a stepladder: Make sure that it is fully opened. Do not climb a closed stepladder. Make sure that both sides of the stepladder are locked into place. Ask someone to hold it for you, if possible. Clearly mark and make sure that you can see: Any grab bars or handrails. First and last steps. Where the edge of each step is. Use tools that help you move around (mobility aids) if they are needed. These  include: Canes. Walkers. Scooters. Crutches. Turn on the lights when you go into a dark area. Replace any light bulbs as soon as they burn out. Set up your furniture so you have a clear path. Avoid moving your furniture around. If any of your floors are uneven, fix them. If there are any pets around you, be aware of where they are. Review your medicines with your doctor. Some medicines can make you feel dizzy. This can increase your chance of falling. Ask your doctor what other things that you can do to help prevent falls. This information is not intended to replace advice given to you by your health care provider. Make sure you discuss any questions you have with your health care provider. Document Released: 04/15/2009 Document Revised: 11/25/2015 Document Reviewed: 07/24/2014 Elsevier Interactive Patient Education  2017 [employer].

## 2021-07-21 NOTE — Progress Notes (Signed)
Subjective:   Roberto [name] is a 66 y.o. male who presents for Medicare Annual/Subsequent preventive examination.  I connected with  Roberto [name] on 07/21/21 by a telephone enabled telemedicine application and verified that I am speaking with the correct person using two identifiers.   I discussed the limitations of evaluation and management by telemedicine. The patient expressed understanding and agreed to proceed.  Patient location: home  Provider location: Tele-Health not in office    Review of Systems     Cardiac Risk Factors include: advanced age (>74men, >89 women);hypertension;obesity (BMI >30kg/m2);male gender;diabetes mellitus     Objective:    Today's Vitals   There is no height or weight on file to calculate BMI.  Advanced Directives 07/21/2021 10/17/2020 07/16/2020 09/01/2019 07/14/2019 01/28/2019 01/25/2019  Does Patient Have a Medical Advance Directive? No No No No No No No  Does patient want to make changes to medical advance directive? - - - - - - -  Would patient like information on creating a medical advance directive? No - Patient declined No - Patient declined - No - Patient declined - No - Patient declined -    Current Medications (verified) Outpatient Encounter Medications as of 07/21/2021  Medication Sig   albuterol (PROVENTIL) (2.5 MG/3ML) 0.083% nebulizer solution USE 1 VIAL IN NEBULIZER EVERY 6 HOURS AS NEEDED FOR WHEEZING FOR SHORTNESS OF BREATH J44.9   allopurinol (ZYLOPRIM) 100 MG tablet Take 1 tablet by mouth once daily   aspirin 81 MG chewable tablet Chew 81 mg by mouth daily.   cyclobenzaprine (FLEXERIL) 10 MG tablet Take 1 tablet (10 mg total) by mouth 3 times/day as needed-between meals & bedtime for muscle spasms.   EQ ALLERGY RELIEF, CETIRIZINE, 10 MG tablet Take 1 tablet by mouth once daily   FARXIGA 10 MG TABS tablet Take 1 tablet by mouth once daily   fenofibrate (TRICOR) 48 MG tablet Take 1 tablet by mouth once daily   furosemide (LASIX)  40 MG tablet TAKE 2 TABLETS BY MOUTH IN THE MORNING AND 1 TABLET IN THE EVENING.   lisinopril (ZESTRIL) 5 MG tablet Take 1 tablet (5 mg total) by mouth daily.   metFORMIN (GLUCOPHAGE-XR) 500 MG 24 hr tablet Take 1 tablet by mouth once daily with breakfast   metoprolol succinate (TOPROL-XL) 100 MG 24 hr tablet TAKE 1 TABLET BY MOUTH ONCE DAILY WITH  OR  IMMEDIATELY  FOLLOWING  A  MEAL   nortriptyline (PAMELOR) 10 MG capsule Take 1 capsule (10 mg total) by mouth at bedtime.   Omega-3 Fatty Acids (FISH OIL) 1000 MG CAPS Take 5,000 mg by mouth daily.   potassium chloride SA (KLOR-CON) 20 MEQ tablet Take 1 tablet (20 mEq total) by mouth daily.   rosuvastatin (CRESTOR) 40 MG tablet Take 1 tablet (40 mg total) by mouth daily.   spironolactone (ALDACTONE) 25 MG tablet Take 1 tablet (25 mg total) by mouth daily.   VENTOLIN HFA 108 (90 Base) MCG/ACT inhaler INHALE 2 PUFFS BY MOUTH EVERY 6 HOURS AS NEEDED FOR WHEEZING OR SHORTNESS OF BREATH   warfarin (COUMADIN) 4 MG tablet TAKE 1 TABLET BY MOUTH ONCE DAILY AT  6PM  AS  DIRECTED   warfarin (COUMADIN) 7.5 MG tablet Take 1 tablet (7.5 mg total) by mouth daily. 4 mg for 2 days, 7.5 mg on the 3rd day, and repeat   No facility-administered encounter medications on file as of 07/21/2021.    Allergies (verified) Patient has no known allergies.   History: Past  Medical History:  Diagnosis Date   Bicuspid aortic valve    a. s/p #27 Carbomedics mechanical valve on 03/25/2010; b. on Coumadin; c. TTE 12/17: EF 40-45%, moderately dilated LV with moderate LVH, AVR well-seated with 14 mmHg gradient, peak AV velocity 2.5 m/s, mild mitral valve thickening with mild MR, mildly dilated RV with mildly reduced contraction   Cellulitis    Chronic systolic CHF (congestive heart failure) ([place])    a. R/LHC 03/2010 showed no significant CAD, LVEDP 31 mmHg, mean AoV gradient 34 mmHg at rest and 47 mmHg with dobutamine 20 mcg/kg/min, AVA 1.0 cm^2, RA 31, RV 68/25, PA 68/47, PCWP 38.  PA sat 65%. CO 6.2 L/min ([place]) and 5.3 L/min (thermodilution)   Clotting disorder (HCC)    H/O mechanical aortic valve replacement 03/25/2010   a. #27 Carbomedics mechanical valve   Hypercholesterolemia    Renal infarct [facility]) 2017   Multiple right renal infarcts, likely embolic.   Stroke [facility])    TIA (transient ischemic attack) 05/2014   Past Surgical History:  Procedure Laterality Date   AORTIC VALVE REPLACEMENT     CARDIAC CATHETERIZATION  03/21/2010   No significant CAD. Severe aortic stenosis. Severely elevated left and right heart filling pressures.   CARDIAC SURGERY  2009   CHF   CARPAL TUNNEL RELEASE Left 2005   RIGHT HEART CATH AND CORONARY ANGIOGRAPHY N/A 01/28/2019   Procedure: RIGHT HEART CATH AND CORONARY ANGIOGRAPHY;  Surgeon: Nelva Bush, MD;  Location: [facility];  Service: Cardiovascular;  Laterality: N/A;   TONSILLECTOMY  1962   Family History  Problem Relation Age of Onset   Arthritis Mother    Dementia Mother    Colon cancer Mother    Arthritis Father    Diabetes Father    Stroke Father    Colon cancer Father    Heart attack Brother    Breast cancer Sister    Seizures Sister    Cancer Brother        brain   Heart disease Brother    Heart attack Brother    Social History   Socioeconomic History   Marital status: Single    Spouse name: Not on file   Number of children: 1   Years of education: 4   Highest education level: Associate degree: academic program  Occupational History   Occupation: Disabled    Employer: UNEMPLOYED  Tobacco Use   Smoking status: Former    Packs/day: 0.50    Years: 46.00    Pack years: 23.00    Types: Cigarettes    Start date: 07/16/2017    Quit date: 05/11/2019    Years since quitting: 2.1   Smokeless tobacco: Never  Vaping Use   Vaping Use: Never used  Substance and Sexual Activity   Alcohol use: No    Alcohol/week: 0.0 standard drinks   Drug use: No   Sexual activity: Not Currently  Other  Topics Concern   Not on file  Social History Narrative   Not on file   Social Determinants of Health   Financial Resource Strain: Low Risk    Difficulty of Paying Living Expenses: Not hard at all  Food Insecurity: No Food Insecurity   Worried About Charity fundraiser in the Last Year: Never true   [place] in the Last Year: Never true  Transportation Needs: No Transportation Needs   Lack of Transportation (Medical): No   Lack of Transportation (Non-Medical): No  Physical Activity:  Sufficiently Active   Days of Exercise per Week: 4 days   Minutes of Exercise per Session: 50 min  Stress: No Stress Concern Present   Feeling of Stress : Not at all  Social Connections: Unknown   Frequency of Communication with Friends and Family: More than three times a week   Frequency of Social Gatherings with Friends and Family: Twice a week   Attends Religious Services: Never   Marine scientist or Organizations: No   Attends Music therapist: Never   Marital Status: Not on file    Tobacco Counseling Counseling given: Not Answered   Clinical Intake:  Pre-visit preparation completed: Yes  Pain : No/denies pain     Nutritional Risks: None Diabetes: No  How often do you need to have someone help you when you read instructions, pamphlets, or other written materials from your doctor or pharmacy?: 1 - Never  Diabetic? Yes  Nutrition Risk Assessment:  Has the patient had any N/V/D within the last 2 months?  No  Does the patient have any non-healing wounds?  No  Has the patient had any unintentional weight loss or weight gain?  No   Diabetes:  Is the patient diabetic?  Yes  If diabetic, was a CBG obtained today?  No  Did the patient bring in their glucometer from home?  No  How often do you monitor your CBG's? Does not check.   Financial Strains and Diabetes Management:  Are you having any financial strains with the device, your supplies or your  medication? No .  Does the patient want to be seen by Chronic Care Management for management of their diabetes?  No  Would the patient like to be referred to a Nutritionist or for Diabetic Management?  No   Diabetic Exams:  Diabetic Eye Exam: . Overdue for diabetic eye exam.   Diabetic Foot Exam: . Pt has been advised about the importance in completing this exam. Interpreter Needed?: No  Information entered by :: Leroy Kennedy LPN   Activities of Daily Living In your present state of health, do you have any difficulty performing the following activities: 07/21/2021 05/11/2021  Hearing? Y N  Vision? N N  Difficulty concentrating or making decisions? N N  Walking or climbing stairs? N N  Dressing or bathing? N N  Doing errands, shopping? N N  Preparing Food and eating ? N -  Using the Toilet? N -  In the past six months, have you accidently leaked urine? N -  Do you have problems with loss of bowel control? N -  Managing your Medications? N -  Managing your Finances? N -  Housekeeping or managing your Housekeeping? N -  Some recent data might be hidden    Patient Care Team: Valerie Roys, DO as PCP - General (Family Medicine) End, Harrell Gave, MD as PCP - Cardiology (Cardiology) Sueanne Margarita, MD as PCP - Sleep Medicine (Cardiology)  Indicate any recent Medical Services you may have received from other than [facility] providers in the past year (date may be approximate).     Assessment:   This is a routine wellness examination for Roberto [name].  Hearing/Vision screen Hearing Screening - Comments:: Little trouble hearing Vision Screening - Comments:: Not up to date  Dietary issues and exercise activities discussed: Current Exercise Habits: Structured exercise class, Type of exercise: treadmill, Time (Minutes): 50, Frequency (Times/Week): 4, Weekly Exercise (Minutes/Week): 200, Intensity: Moderate   Goals Addressed  This Visit's Progress    Weight (lb) < 200 lb  (90.7 kg)         Depression Screen PHQ 2/9 Scores 07/21/2021 07/16/2020 09/01/2019 07/14/2019 07/11/2018 07/06/2017 05/17/2017  PHQ - 2 Score 0 3 1 1 1 2  0  PHQ- 9 Score - 3 - - - 2 -    Fall Risk Fall Risk  07/21/2021 05/11/2021 10/25/2020 07/16/2020 09/01/2019  Falls in the past year? 0 0 0 0 0  Number falls in past yr: 0 0 0 - -  Injury with Fall? 0 0 0 - -  Risk for fall due to : - No Fall Risks No Fall Risks Medication side effect -  Follow up Falls evaluation completed;Falls prevention discussed Falls prevention discussed Falls evaluation completed Falls evaluation completed;Education provided;Falls prevention discussed -    FALL RISK PREVENTION PERTAINING TO THE HOME:  Any stairs in or around the home? Yes  If so, are there any without handrails? No  Home free of loose throw rugs in walkways, pet beds, electrical cords, etc? Yes  Adequate lighting in your home to reduce risk of falls? Yes   ASSISTIVE DEVICES UTILIZED TO PREVENT FALLS:  Life alert? No  Use of a cane, walker or w/c? No  Grab bars in the bathroom? No  Shower chair or bench in shower? No  Elevated toilet seat or a handicapped toilet? No   TIMED UP AND GO:  Was the test performed? No .    Cognitive Function:  Normal cognitive status assessed by direct observation by this Nurse Health Advisor. No abnormalities found.       6CIT Screen 07/16/2020 07/11/2018 07/06/2017  What Year? 0 points 0 points 0 points  What month? 0 points 0 points 0 points  What time? 0 points 0 points 0 points  Count back from 20 0 points 0 points 0 points  Months in reverse 0 points 0 points 0 points  Repeat phrase 8 points 0 points 0 points  Total Score 8 0 0    Immunizations Immunization History  Administered Date(s) Administered   Fluad Quad(high Dose 65+) 05/11/2021   Influenza,inj,Quad PF,6+ Mos 04/19/2016, 05/17/2017, 04/03/2018   Pneumococcal Conjugate-13 05/11/2021   Pneumococcal Polysaccharide-23 06/09/2014   Tdap 06/09/2014,  10/17/2020    TDAP status: Up to date  Flu Vaccine status: Up to date  Pneumococcal vaccine status: Up to date  Covid-19 vaccine status: Declined, Education has been provided regarding the importance of this vaccine but patient still declined. Advised may receive this vaccine at local pharmacy or Health Dept.or vaccine clinic. Aware to provide a copy of the vaccination record if obtained from local pharmacy or Health Dept. Verbalized acceptance and understanding.  Qualifies for Shingles Vaccine? Yes   Zostavax completed No   Shingrix Completed?: No.    Education has been provided regarding the importance of this vaccine. Patient has been advised to call insurance company to determine out of pocket expense if they have not yet received this vaccine. Advised may also receive vaccine at local pharmacy or Health Dept. Verbalized acceptance and understanding.  Screening Tests Health Maintenance  Topic Date Due   COVID-19 Vaccine (1) Never done   OPHTHALMOLOGY EXAM  06/01/2017   Zoster Vaccines- Shingrix (1 of 2) 08/11/2021 (Originally 08/25/1974)   FOOT EXAM  10/25/2021   HEMOGLOBIN A1C  11/08/2021   Pneumonia Vaccine 15+ Years old (3 - PPSV23 if available, else PCV20) 05/11/2022   TETANUS/TDAP  10/18/2030   Hepatitis C Screening  Completed   HPV VACCINES  Aged Out   INFLUENZA VACCINE  Discontinued   COLONOSCOPY (Pts 45-68yrs Insurance coverage will need to be confirmed)  Discontinued    Health Maintenance  Health Maintenance Due  Topic Date Due   COVID-19 Vaccine (1) Never done   OPHTHALMOLOGY EXAM  06/01/2017    Colonoscopy declined  Lung Cancer Screening: (Low Dose CT Chest recommended if Age 81-80 years, 30 pack-year currently smoking OR have quit w/in 15years.) does not qualify.   Lung Cancer Screening Referral:   Additional Screening:  Hepatitis C Screening:  Completed 2016  Vision Screening: Recommended annual ophthalmology exams for early detection of glaucoma and  other disorders of the eye. Is the patient up to date with their annual eye exam?  No  Who is the provider or what is the name of the office in which the patient attends annual eye exams?  If pt is not established with a provider, would they like to be referred to a provider to establish care? No .   Dental Screening: Recommended annual dental exams for proper oral hygiene  Community Resource Referral / Chronic Care Management: CRR required this visit?  No   CCM required this visit?  No      Plan:     I have personally reviewed and noted the following in the patients chart:   Medical and social history Use of alcohol, tobacco or illicit drugs  Current medications and supplements including opioid prescriptions. Patient is not currently taking opioid prescriptions. Functional ability and status Nutritional status Physical activity Advanced directives List of other physicians Hospitalizations, surgeries, and ER visits in previous 12 months Vitals Screenings to include cognitive, depression, and falls Referrals and appointments  In addition, I have reviewed and discussed with patient certain preventive protocols, quality metrics, and best practice recommendations. A written personalized care plan for preventive services as well as general preventive health recommendations were provided to patient.     Leroy Kennedy, LPN   [id number]   Nurse Notes:

## 2021-07-22 ENCOUNTER — Ambulatory Visit: Payer: Medicare Other

## 2021-07-24 DIAGNOSIS — Z7901 Long term (current) use of anticoagulants: Secondary | ICD-10-CM | POA: Diagnosis not present

## 2021-07-24 DIAGNOSIS — Z952 Presence of prosthetic heart valve: Secondary | ICD-10-CM | POA: Diagnosis not present

## 2021-07-24 LAB — PROTIME-INR: INR: 3 — AB (ref 0.9–1.1)

## 2021-07-29 ENCOUNTER — Encounter: Payer: Self-pay | Admitting: Nurse Practitioner

## 2021-07-29 DIAGNOSIS — Z7901 Long term (current) use of anticoagulants: Secondary | ICD-10-CM | POA: Insufficient documentation

## 2021-08-01 ENCOUNTER — Encounter: Payer: Self-pay | Admitting: Family Medicine

## 2021-08-01 LAB — POCT INR: INR: 2.3 (ref 2.0–3.0)

## 2021-08-02 ENCOUNTER — Encounter: Payer: Self-pay | Admitting: Family Medicine

## 2021-08-02 LAB — POCT INR: INR: 3 (ref 2.0–3.0)

## 2021-08-10 LAB — PROTIME-INR: INR: 2.5 — AB (ref 0.80–1.20)

## 2021-08-11 ENCOUNTER — Ambulatory Visit: Payer: Medicare Other | Admitting: Family Medicine

## 2021-08-12 ENCOUNTER — Other Ambulatory Visit: Payer: Self-pay | Admitting: Surgery

## 2021-08-12 DIAGNOSIS — I7121 Aneurysm of the ascending aorta, without rupture: Secondary | ICD-10-CM

## 2021-08-17 ENCOUNTER — Ambulatory Visit: Payer: Medicare Other | Admitting: Internal Medicine

## 2021-08-18 ENCOUNTER — Encounter: Payer: Self-pay | Admitting: Family Medicine

## 2021-08-18 LAB — POCT INR: INR: 3 (ref 2.0–3.0)

## 2021-08-26 ENCOUNTER — Encounter: Payer: Self-pay | Admitting: Family Medicine

## 2021-08-26 DIAGNOSIS — Z952 Presence of prosthetic heart valve: Secondary | ICD-10-CM | POA: Diagnosis not present

## 2021-08-26 DIAGNOSIS — Z7901 Long term (current) use of anticoagulants: Secondary | ICD-10-CM | POA: Diagnosis not present

## 2021-08-26 LAB — POCT INR: INR: 3 (ref 2.0–3.0)

## 2021-09-12 ENCOUNTER — Telehealth: Payer: Self-pay

## 2021-09-12 NOTE — Telephone Encounter (Signed)
Contacted patient to schedule follow up appointment with PCP. Patient friend [name], asked to call back to schedule appointment due to next available being in April with Dr. Wynetta Emery.  ?

## 2021-09-29 LAB — PROTIME-INR: INR: 2.3 — AB (ref 0.80–1.20)

## 2021-09-30 ENCOUNTER — Other Ambulatory Visit: Payer: Self-pay | Admitting: *Deleted

## 2021-09-30 MED ORDER — FUROSEMIDE 40 MG PO TABS: ORAL_TABLET | ORAL | 0 refills | Status: DC

## 2021-10-05 ENCOUNTER — Ambulatory Visit: Payer: Medicare Other | Admitting: Surgery

## 2021-10-05 ENCOUNTER — Other Ambulatory Visit: Payer: Medicare Other

## 2021-10-05 NOTE — Progress Notes (Deleted)
? ?Follow-up Outpatient Visit ?Date: 10/06/2021 ? ?Primary Care Provider: ?Valerie Roys, DO ?[address] ?[place] [address] ? ?Chief Complaint: *** ? ?HPI:  Roberto [name] is a 66 y.o. male with history of mechanical aortic valve replacement, non-obstructive coronary artery disease, thoracic aortic aneurysm (followed by TCTS), stroke, right renal infarct, chronic systolic heart failure (LVEF 40-45%), hyperlipidemia, obstructive sleep apnea not using CPAP, and morbid obesity, who presents for follow-up of valvular heart disease, cardiomyopathy, and thoracic aortic aneurysm, who presents for follow-up of valvular heart disease, cardiomyopathy, and thoracic aortic aneurysm.  I last saw him in 04/2021, at which time he was feeling fairly well.  His exercise had been on hold due to a gout flare ago he was planning to return to the gym. ? ?-------------------------------------------------------------------------------------------------- ? ?Past Medical History:  ?Diagnosis Date  ? Bicuspid aortic valve   ? a. s/p #27 Carbomedics mechanical valve on 03/25/2010; b. on Coumadin; c. TTE 12/17: EF 40-45%, moderately dilated LV with moderate LVH, AVR well-seated with 14 mmHg gradient, peak AV velocity 2.5 m/s, mild mitral valve thickening with mild MR, mildly dilated RV with mildly reduced contraction  ? Cellulitis   ? Chronic systolic CHF (congestive heart failure) ([place])   ? a. R/LHC 03/2010 showed no significant CAD, LVEDP 31 mmHg, mean AoV gradient 34 mmHg at rest and 47 mmHg with dobutamine 20 mcg/kg/min, AVA 1.0 cm^2, RA 31, RV 68/25, PA 68/47, PCWP 38. PA sat 65%. CO 6.2 L/min ([place]) and 5.3 L/min (thermodilution)  ? Clotting disorder ([place])   ? H/O mechanical aortic valve replacement 03/25/2010  ? a. #27 Carbomedics mechanical valve  ? Hypercholesterolemia   ? Renal infarct [facility]) 2017  ? Multiple right renal infarcts, likely embolic.  ? Stroke [facility])   ? TIA (transient ischemic attack) 05/2014  ? ?Past Surgical History:   ?Procedure Laterality Date  ? AORTIC VALVE REPLACEMENT    ? CARDIAC CATHETERIZATION  03/21/2010  ? No significant CAD. Severe aortic stenosis. Severely elevated left and right heart filling pressures.  ? CARDIAC SURGERY  2009  ? CHF  ? CARPAL TUNNEL RELEASE Left 2005  ? RIGHT HEART CATH AND CORONARY ANGIOGRAPHY N/A 01/28/2019  ? Procedure: RIGHT HEART CATH AND CORONARY ANGIOGRAPHY;  Surgeon: Nelva Bush, MD;  Location: [facility];  Service: Cardiovascular;  Laterality: N/A;  ? TONSILLECTOMY  1962  ? ? ?No outpatient medications have been marked as taking for the 10/06/21 encounter (Appointment) with Tierra Divelbiss, Harrell Gave, MD.  ? ? ?Allergies: Patient has no known allergies. ? ?Social History  ? ?Tobacco Use  ? Smoking status: Former  ?  Packs/day: 0.50  ?  Years: 46.00  ?  Pack years: 23.00  ?  Types: Cigarettes  ?  Start date: 07/16/2017  ?  Quit date: 05/11/2019  ?  Years since quitting: 2.4  ? Smokeless tobacco: Never  ?Vaping Use  ? Vaping Use: Never used  ?Substance Use Topics  ? Alcohol use: No  ?  Alcohol/week: 0.0 standard drinks  ? Drug use: No  ? ? ?Family History  ?Problem Relation Age of Onset  ? Arthritis Mother   ? Dementia Mother   ? Colon cancer Mother   ? Arthritis Father   ? Diabetes Father   ? Stroke Father   ? Colon cancer Father   ? Heart attack Brother   ? Breast cancer Sister   ? Seizures Sister   ? Cancer Brother   ?     brain  ?  Heart disease Brother   ? Heart attack Brother   ? ? ?Review of Systems: ?A 12-system review of systems was performed and was negative except as noted in the HPI. ? ?-------------------------------------------------------------------------------------------------- ? ?Physical Exam: ?There were no vitals taken for this visit. ? ?General:  NAD. ?Neck: No JVD or HJR. ?Lungs: Clear to auscultation bilaterally without wheezes or crackles. ?Heart: Regular rate and rhythm without murmurs, rubs, or gallops. ?Abdomen: Soft, nontender, nondistended. ?Extremities: No  lower extremity edema. ? ?EKG:  *** ? ?Lab Results  ?Component Value Date  ? WBC 8.7 05/11/2021  ? HGB 13.6 05/11/2021  ? HCT 42.2 05/11/2021  ? MCV 87 05/11/2021  ? PLT 284 05/11/2021  ? ? ?Lab Results  ?Component Value Date  ? NA 140 05/11/2021  ? K 4.6 05/11/2021  ? CL 102 05/11/2021  ? CO2 24 05/11/2021  ? BUN 25 05/11/2021  ? CREATININE 1.45 (H) 05/11/2021  ? GLUCOSE 116 (H) 05/11/2021  ? ALT 61 (H) 05/11/2021  ? ? ?Lab Results  ?Component Value Date  ? CHOL 144 05/11/2021  ? HDL 40 05/11/2021  ? [place] 55 05/11/2021  ? LDLDIRECT 96.4 06/04/2019  ? TRIG 316 (H) 05/11/2021  ? CHOLHDL 4.3 08/08/2019  ? ? ?-------------------------------------------------------------------------------------------------- ? ?ASSESSMENT AND PLAN: ?*** ? ?Nelva Bush, MD ?10/05/2021 ?2:43 PM ? ?

## 2021-10-06 ENCOUNTER — Ambulatory Visit: Payer: Medicare Other | Admitting: Internal Medicine

## 2021-10-06 LAB — PROTIME-INR: INR: 2.5 — AB (ref 0.80–1.20)

## 2021-10-07 ENCOUNTER — Encounter: Payer: Self-pay | Admitting: Family Medicine

## 2021-10-07 DIAGNOSIS — Z952 Presence of prosthetic heart valve: Secondary | ICD-10-CM | POA: Diagnosis not present

## 2021-10-07 DIAGNOSIS — Z7901 Long term (current) use of anticoagulants: Secondary | ICD-10-CM | POA: Diagnosis not present

## 2021-10-07 LAB — PROTIME-INR: INR: 3 — AB (ref 0.80–1.20)

## 2021-10-12 ENCOUNTER — Encounter: Payer: Self-pay | Admitting: *Deleted

## 2021-10-12 ENCOUNTER — Ambulatory Visit (INDEPENDENT_AMBULATORY_CARE_PROVIDER_SITE_OTHER): Payer: Medicare Other | Admitting: Internal Medicine

## 2021-10-12 ENCOUNTER — Encounter: Payer: Self-pay | Admitting: Internal Medicine

## 2021-10-12 VITALS — BP 90/58 | HR 65 | Ht 66.0 in | Wt 326.0 lb

## 2021-10-12 DIAGNOSIS — Z952 Presence of prosthetic heart valve: Secondary | ICD-10-CM

## 2021-10-12 DIAGNOSIS — I7121 Aneurysm of the ascending aorta, without rupture: Secondary | ICD-10-CM | POA: Diagnosis not present

## 2021-10-12 DIAGNOSIS — I712 Thoracic aortic aneurysm, without rupture, unspecified: Secondary | ICD-10-CM

## 2021-10-12 DIAGNOSIS — N1831 Chronic kidney disease, stage 3a: Secondary | ICD-10-CM

## 2021-10-12 DIAGNOSIS — I5022 Chronic systolic (congestive) heart failure: Secondary | ICD-10-CM | POA: Diagnosis not present

## 2021-10-12 DIAGNOSIS — I2510 Atherosclerotic heart disease of native coronary artery without angina pectoris: Secondary | ICD-10-CM | POA: Diagnosis not present

## 2021-10-12 NOTE — Patient Instructions (Signed)
Medication Instructions:  ? ?Your physician recommends that you continue on your current medications as directed. Please refer to the Current Medication list given to you today. ? ?*If you need a refill on your cardiac medications before your next appointment, please call your pharmacy* ? ? ?Lab Work: ? ?BMET today ? ?If you have labs (blood work) drawn today and your tests are completely normal, you will receive your results only by: ?MyChart Message (if you have MyChart) OR ?A paper copy in the mail ?If you have any lab test that is abnormal or we need to change your treatment, we will call you to review the results. ? ? ?Testing/Procedures: ? ?None ordered ? ? ?Follow-Up: ?At [facility], you and your health needs are our priority.  As part of our continuing mission to provide you with exceptional heart care, we have created designated Provider Care Teams.  These Care Teams include your primary Cardiologist (physician) and Advanced Practice Providers (APPs -  Physician Assistants and Nurse Practitioners) who all work together to provide you with the care you need, when you need it. ? ?We recommend signing up for the patient portal called "MyChart".  Sign up information is provided on this After Visit Summary.  MyChart is used to connect with patients for Virtual Visits (Telemedicine).  Patients are able to view lab/test results, encounter notes, upcoming appointments, etc.  Non-urgent messages can be sent to your provider as well.   ?To learn more about what you can do with MyChart, go to [email or web address].   ? ?Your next appointment:   ?3 month(s) ? ?The format for your next appointment:   ?In Person ? ?Provider:   ?You may see Nelva Bush, MD or one of the following Advanced Practice Providers on your designated Care Team:   ?Murray Hodgkins, NP ?Christell Faith, [name] ?Cadence Kathlen Mody, [name]{ ? ?Important Information About Sugar ? ? ? ? ? ? ?

## 2021-10-12 NOTE — Progress Notes (Signed)
? ?Follow-up Outpatient Visit ?Date: 10/12/2021 ? ?Primary Care Provider: ?Valerie Roys, DO ?[address] ?[place] [address] ? ?Chief Complaint: Follow-up thoracic aortic aneurysm and mechanical aortic valve ? ?HPI:  Roberto [name] is a 66 y.o. male with history of mechanical aortic valve replacement, non-obstructive coronary artery disease, thoracic aortic aneurysm (followed by [facility]CTS), stroke, right renal infarct, chronic systolic heart failure (LVEF 40-45%), hyperlipidemia, obstructive sleep apnea not using CPAP, and morbid obesity, who presents for follow-up of valvular heart disease, cardiomyopathy, and thoracic aortic aneurysm, who presents for follow-up of valvular heart disease, cardiomyopathy, and thoracic aortic aneurysm.  I last saw him in 04/2021, at which time he was feeling fairly well.  His exercise had been on hold due to a gout flare ago he was planning to return to the gym. ? ?Today, [name] feels fairly well.  He has had recurrent gout flares as well as trauma to the right knee that has kept him from the gym.  He hopes to go back again soon.  He otherwise feels well, denying chest pain, shortness of breath, palpitations, lightheadedness, and edema.  He was scheduled for follow-up with Dr. Cyndia Bent earlier this month, though the appointment had to be canceled due to scheduling conflict. ? ?-------------------------------------------------------------------------------------------------- ? ?Past Medical History:  ?Diagnosis Date  ? Bicuspid aortic valve   ? a. s/p #27 Carbomedics mechanical valve on 03/25/2010; b. on Coumadin; c. TTE 12/17: EF 40-45%, moderately dilated LV with moderate LVH, AVR well-seated with 14 mmHg gradient, peak AV velocity 2.5 m/s, mild mitral valve thickening with mild MR, mildly dilated RV with mildly reduced contraction  ? Cellulitis   ? Chronic systolic CHF (congestive heart failure) ([place])   ? a. R/LHC 03/2010 showed no significant CAD, LVEDP 31 mmHg, mean AoV gradient 34  mmHg at rest and 47 mmHg with dobutamine 20 mcg/kg/min, AVA 1.0 cm^2, RA 31, RV 68/25, PA 68/47, PCWP 38. PA sat 65%. CO 6.2 L/min ([place]) and 5.3 L/min (thermodilution)  ? Clotting disorder ([place])   ? H/O mechanical aortic valve replacement 03/25/2010  ? a. #27 Carbomedics mechanical valve  ? Hypercholesterolemia   ? Renal infarct [facility]) 2017  ? Multiple right renal infarcts, likely embolic.  ? Stroke [facility])   ? TIA (transient ischemic attack) 05/2014  ? ?Past Surgical History:  ?Procedure Laterality Date  ? AORTIC VALVE REPLACEMENT    ? CARDIAC CATHETERIZATION  03/21/2010  ? No significant CAD. Severe aortic stenosis. Severely elevated left and right heart filling pressures.  ? CARDIAC SURGERY  2009  ? CHF  ? CARPAL TUNNEL RELEASE Left 2005  ? RIGHT HEART CATH AND CORONARY ANGIOGRAPHY N/A 01/28/2019  ? Procedure: RIGHT HEART CATH AND CORONARY ANGIOGRAPHY;  Surgeon: Nelva Bush, MD;  Location: [facility];  Service: Cardiovascular;  Laterality: N/A;  ? TONSILLECTOMY  1962  ? ? ?Current Meds  ?Medication Sig  ? albuterol (PROVENTIL) (2.5 MG/3ML) 0.083% nebulizer solution USE 1 VIAL IN NEBULIZER EVERY 6 HOURS AS NEEDED FOR WHEEZING FOR SHORTNESS OF BREATH J44.9  ? allopurinol (ZYLOPRIM) 100 MG tablet Take 1 tablet by mouth once daily  ? aspirin 81 MG chewable tablet Chew 81 mg by mouth daily.  ? cyclobenzaprine (FLEXERIL) 10 MG tablet Take 1 tablet (10 mg total) by mouth 3 times/day as needed-between meals & bedtime for muscle spasms.  ? EQ ALLERGY RELIEF, CETIRIZINE, 10 MG tablet Take 1 tablet by mouth once daily  ? FARXIGA 10 MG TABS tablet Take 1  tablet by mouth once daily  ? fenofibrate (TRICOR) 48 MG tablet Take 1 tablet by mouth once daily  ? furosemide (LASIX) 40 MG tablet TAKE 2 TABLETS BY MOUTH IN THE MORNING AND 1 TABLET IN THE EVENING.  ? lisinopril (ZESTRIL) 5 MG tablet Take 1 tablet (5 mg total) by mouth daily.  ? metFORMIN (GLUCOPHAGE-XR) 500 MG 24 hr tablet Take 1 tablet by mouth once daily  with breakfast  ? metoprolol succinate (TOPROL-XL) 100 MG 24 hr tablet TAKE 1 TABLET BY MOUTH ONCE DAILY WITH  OR  IMMEDIATELY  FOLLOWING  A  MEAL  ? nortriptyline (PAMELOR) 10 MG capsule Take 1 capsule (10 mg total) by mouth at bedtime.  ? Omega-3 Fatty Acids (FISH OIL) 1000 MG CAPS Take 5,000 mg by mouth daily.  ? potassium chloride SA (KLOR-CON) 20 MEQ tablet Take 1 tablet (20 mEq total) by mouth daily.  ? rosuvastatin (CRESTOR) 40 MG tablet Take 1 tablet (40 mg total) by mouth daily.  ? spironolactone (ALDACTONE) 25 MG tablet Take 1 tablet (25 mg total) by mouth daily.  ? VENTOLIN HFA 108 (90 Base) MCG/ACT inhaler INHALE 2 PUFFS BY MOUTH EVERY 6 HOURS AS NEEDED FOR WHEEZING OR SHORTNESS OF BREATH  ? warfarin (COUMADIN) 4 MG tablet TAKE 1 TABLET BY MOUTH ONCE DAILY AT  6PM  AS  DIRECTED  ? warfarin (COUMADIN) 7.5 MG tablet Take 1 tablet (7.5 mg total) by mouth daily. 4 mg for 2 days, 7.5 mg on the 3rd day, and repeat  ? ? ?Allergies: Patient has no known allergies. ? ?Social History  ? ?Tobacco Use  ? Smoking status: Former  ?  Packs/day: 0.50  ?  Years: 46.00  ?  Pack years: 23.00  ?  Types: Cigarettes  ?  Start date: 07/16/2017  ?  Quit date: 05/11/2019  ?  Years since quitting: 2.4  ? Smokeless tobacco: Never  ?Vaping Use  ? Vaping Use: Never used  ?Substance Use Topics  ? Alcohol use: No  ?  Alcohol/week: 0.0 standard drinks  ? Drug use: No  ? ? ?Family History  ?Problem Relation Age of Onset  ? Arthritis Mother   ? Dementia Mother   ? Colon cancer Mother   ? Arthritis Father   ? Diabetes Father   ? Stroke Father   ? Colon cancer Father   ? Heart attack Brother   ? Breast cancer Sister   ? Seizures Sister   ? Cancer Brother   ?     brain  ? Heart disease Brother   ? Heart attack Brother   ? ? ?Review of Systems: ?A 12-system review of systems was performed and was negative except as noted in the  HPI. ? ?-------------------------------------------------------------------------------------------------- ? ?Physical Exam: ?BP (!) 90/58 (BP Location: Left Arm, Patient Position: Sitting, Cuff Size: Large)   Pulse 65   Ht '5\' 6"'$  (1.676 m)   Wt (!) 326 lb (147.9 kg)   SpO2 97%   BMI 52.62 kg/m?  ? ?General:  NAD. ?Neck: No gross JVD, though body habitus limits evaluation. ?Lungs: Clear to auscultation bilaterally without wheezes or crackles. ?Heart: Regular rate and rhythm with mechanical S2 in 1/6 systolic murmur. ?Abdomen: Soft, nontender, nondistended. ?Extremities: Trace pretibial edema bilaterally. ? ?EKG: Normal sinus rhythm with PACs and nonspecific intraventricular conduction delay.  Compared with prior tracing from 12/08/2020, PACs have replaced PVCs. ? ?Lab Results  ?Component Value Date  ? WBC 8.7 05/11/2021  ? HGB 13.6 05/11/2021  ?  HCT 42.2 05/11/2021  ? MCV 87 05/11/2021  ? PLT 284 05/11/2021  ? ? ?Lab Results  ?Component Value Date  ? NA 138 10/12/2021  ? K 4.8 10/12/2021  ? CL 102 10/12/2021  ? CO2 21 10/12/2021  ? BUN 35 (H) 10/12/2021  ? CREATININE 1.43 (H) 10/12/2021  ? GLUCOSE 123 (H) 10/12/2021  ? ALT 61 (H) 05/11/2021  ? ? ?Lab Results  ?Component Value Date  ? CHOL 144 05/11/2021  ? HDL 40 05/11/2021  ? [place] 55 05/11/2021  ? LDLDIRECT 96.4 06/04/2019  ? TRIG 316 (H) 05/11/2021  ? CHOLHDL 4.3 08/08/2019  ? ? ?-------------------------------------------------------------------------------------------------- ? ?ASSESSMENT AND PLAN: ?Chronic HFrEF: ?Volume status is relatively stable.  Minimal symptoms reported today consistent with NYHA class II heart failure.  Due to borderline low blood pressures today, we are again unable to escalate GDMT.  Continue current regimen of dapagliflozin, furosemide, lisinopril, and metoprolol succinate.  We will check a BMP ton ensure stable renal function and electrolytes. ? ?Coronary artery disease: ?Moderate nonobstructive single-vessel CAD noted by  catheterization.  No angina reported.  Continue aspirin and statin therapy. ? ?History of aortic valve replacement: ?No signs or symptoms to suggest valve incompetence.  Continue anticoagulation with warfarin managed by Dr. Wynetta Emery. ? ?Thoracic aortic aneurysm: ?I encouraged Mr. Trumbull to reschedule follow-up with Dr. Cyndia Bent.  Co

## 2021-10-12 NOTE — Research (Unsigned)
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 Encouraged him to cal me back.  ?

## 2021-10-13 ENCOUNTER — Encounter: Payer: Self-pay | Admitting: Internal Medicine

## 2021-10-13 LAB — BASIC METABOLIC PANEL
BUN/Creatinine Ratio: 24 (ref 10–24)
BUN: 35 mg/dL — ABNORMAL HIGH (ref 8–27)
CO2: 21 mmol/L (ref 20–29)
Calcium: 9.6 mg/dL (ref 8.6–10.2)
Chloride: 102 mmol/L (ref 96–106)
Creatinine, Ser: 1.43 mg/dL — ABNORMAL HIGH (ref 0.76–1.27)
Glucose: 123 mg/dL — ABNORMAL HIGH (ref 70–99)
Potassium: 4.8 mmol/L (ref 3.5–5.2)
Sodium: 138 mmol/L (ref 134–144)
eGFR: 54 mL/min/{1.73_m2} — ABNORMAL LOW (ref >59)

## 2021-10-13 MED ORDER — ROSUVASTATIN CALCIUM 40 MG PO TABS: 40.0000 mg | ORAL_TABLET | Freq: Every day | ORAL | 3 refills | Status: DC

## 2021-10-13 MED ORDER — LISINOPRIL 5 MG PO TABS: 5.0000 mg | ORAL_TABLET | Freq: Every day | ORAL | 3 refills | Status: DC

## 2021-10-13 MED ORDER — DAPAGLIFLOZIN PROPANEDIOL 10 MG PO TABS: 10.0000 mg | ORAL_TABLET | Freq: Every day | ORAL | 3 refills | Status: DC

## 2021-10-13 MED ORDER — SPIRONOLACTONE 25 MG PO TABS: 25.0000 mg | ORAL_TABLET | Freq: Every day | ORAL | 3 refills | Status: DC

## 2021-10-13 MED ORDER — METOPROLOL SUCCINATE ER 100 MG PO TB24: ORAL_TABLET | ORAL | 3 refills | Status: DC

## 2021-10-13 MED ORDER — FUROSEMIDE 40 MG PO TABS: ORAL_TABLET | ORAL | 3 refills | Status: DC

## 2021-10-13 MED ORDER — POTASSIUM CHLORIDE CRYS ER 20 MEQ PO TBCR
20.0000 meq | EXTENDED_RELEASE_TABLET | Freq: Every day | ORAL | 3 refills | Status: DC
Start: 2021-10-13 — End: 2022-06-14

## 2021-10-17 LAB — PROTIME-INR: INR: 3 — AB (ref 0.80–1.20)

## 2021-10-18 ENCOUNTER — Telehealth (INDEPENDENT_AMBULATORY_CARE_PROVIDER_SITE_OTHER): Payer: Medicare Other | Admitting: Family Medicine

## 2021-10-18 ENCOUNTER — Telehealth: Payer: Self-pay | Admitting: Family Medicine

## 2021-10-18 DIAGNOSIS — Z952 Presence of prosthetic heart valve: Secondary | ICD-10-CM

## 2021-10-18 NOTE — Telephone Encounter (Signed)
Anticoag

## 2021-10-18 NOTE — Telephone Encounter (Signed)
LVM asking pt to call back to schedule an appointment.  ?

## 2021-10-18 NOTE — Telephone Encounter (Signed)
Overdue for follow up appointment. Please get scheduled ?

## 2021-10-21 ENCOUNTER — Other Ambulatory Visit: Payer: Self-pay | Admitting: Family Medicine

## 2021-10-24 ENCOUNTER — Encounter: Payer: Self-pay | Admitting: *Deleted

## 2021-10-24 NOTE — Telephone Encounter (Signed)
Patient scheduled 4/28 @ 9 am ?

## 2021-10-24 NOTE — Research (Unsigned)
Message left about essence research. Encouraged him to call me back.  ?

## 2021-10-24 NOTE — Telephone Encounter (Signed)
Requested medication (s) are due for refill today: Yes ? ?Requested medication (s) are on the active medication list: Yes ? ?Last refill:  10/25/20 ? ?Future visit scheduled: No ? ?Notes to clinic:  See request. ? ? ? ?Requested Prescriptions  ?Pending Prescriptions Disp Refills  ? warfarin (COUMADIN) 4 MG tablet [Pharmacy Med Name: Warfarin Sodium 4 MG Oral Tablet] 90 tablet 0  ?  Sig: TAKE 1 TABLET BY MOUTH ONCE DAILY AT  6PM  AS  DIRECTED  ?  ? Hematology:  Anticoagulants - warfarin Failed - 10/21/2021  6:19 PM  ?  ?  Failed - Manual Review: If patient's warfarin is managed by Anti-Coag team, route request to them. If not, route request to the provider.  ?  ?  Failed - INR in normal range and within 30 days  ?  INR  ?Date Value Ref Range Status  ?10/17/2021 3.00 (A) 0.80 - 1.20 Final  ?05/11/2021 3.6 (H) 0.9 - 1.1 Final  ?05/25/2014 1.9  Final  ?  Comment:  ?  INR reference interval applies to patients on anticoagulant therapy. ?A single INR therapeutic range for coumarins is not optimal for all ?indications; however, the suggested range for most indications is ?2.0 - 3.0. ?Exceptions to the INR Reference Range may include: Prosthetic heart ?valves, acute myocardial infarction, prevention of myocardial ?infarction, and combinations of aspirin and anticoagulant. The need ?for a higher or lower target INR must be assessed individually. ?Reference: The Pharmacology and Management of the Vitamin K  ?antagonists: the seventh ACCP Conference on Antithrombotic and ?Thrombolytic Therapy. XIHWT.8882 Sept:126 (3suppl): [id number]. ?A HCT value >55% may artifactually increase the PT.  In one study,  ?the increase was an average of 25%. ?Reference:  "Effect on Routine and Special Coagulation Testing Values ?of Citrate Anticoagulant Adjustment in Patients with High HCT Values." ?American Journal of Clinical Pathology 2006;126:400-405. ?  ?  ?  ?  ?  Failed - Valid encounter within last 3 months  ?  Recent Outpatient Visits   ? ?       ? 5 months ago Diabetes mellitus type 2, diet-controlled ([place])  ? [facility] [place], [place] T, NP  ? 9 months ago Diabetes mellitus type 2, diet-controlled ([place])  ? [place] P, DO  ? 12 months ago Diabetes mellitus type 2, diet-controlled ([place])  ? [place], DO  ? 2 years ago Essential hypertension  ? [place], [place] P, DO  ? 2 years ago Seasonal allergic rhinitis due to pollen  ? [place], [place] P, DO  ? ?  ?  ?Future Appointments   ? ?        ? In 2 months End, Harrell Gave, MD [facility]  ? ?  ? ? ?  ?  ?  Passed - HCT in normal range and within 360 days  ?  Hematocrit  ?Date Value Ref Range Status  ?05/11/2021 42.2 37.5 - 51.0 % Final  ?  ?  ?  ?  Passed - Patient is not pregnant  ?  ?  ?Signed Prescriptions Disp Refills  ? metFORMIN (GLUCOPHAGE-XR) 500 MG 24 hr tablet 90 tablet 0  ?  Sig: Take 1 tablet by mouth once daily with breakfast  ?  ? Endocrinology:  Diabetes - Biguanides Failed - 10/21/2021  6:19 PM  ?  ?  Failed - Cr in normal range and within 360 days  ?  Creatinine  ?Date Value Ref Range Status  ?05/25/2014 0.85 0.60 - 1.30 mg/dL Final  ? ?Creatinine, Ser  ?Date Value Ref Range Status  ?10/12/2021 1.43 (H) 0.76 - 1.27 mg/dL Final  ?  ?  ?  ?  Failed - eGFR in normal range and within 360 days  ?  EGFR (African American)  ?Date Value Ref Range Status  ?05/25/2014 >60 >32mL/min Final  ? ?GFR calc Af Wyvonnia Lora  ?Date Value Ref Range Status  ?05/12/2020 64 >59 mL/min/1.73 Final  ?  Comment:  ?  **In accordance with recommendations from the NKF-ASN Task force,** ?  [facility] is in the process of updating its eGFR calculation to the ?  2021 CKD-EPI creatinine equation that estimates kidney function ?  without a race variable. ?  ? ?EGFR (Non-African Amer.)  ?Date Value Ref Range Status  ?05/25/2014 >60 >32mL/min Final  ?  Comment:  ?  eGFR values  <22mL/min/1.73 m2 may be an indication of chronic ?kidney disease (CKD). ?Calculated eGFR, using the MRDR Study equation, is useful in  ?patients with stable renal function. ?The eGFR calculation will not be reliable in acutely ill patients ?when serum creatinine is changing rapidly. It is not useful in ?patients on dialysis. The eGFR calculation may not be applicable ?to patients at the low and high extremes of body sizes, pregnant ?women, and vegetarians. ?  ? ?GFR, Estimated  ?Date Value Ref Range Status  ?08/26/2020 51 (L) >60 mL/min Final  ?  Comment:  ?  (NOTE) ?Calculated using the CKD-EPI Creatinine Equation (2021) ?  ? ?eGFR  ?Date Value Ref Range Status  ?10/12/2021 54 (L) >59 mL/min/1.73 Final  ?  ?  ?  ?  Failed - B12 Level in normal range and within 720 days  ?  No results found for: VITAMINB12  ?  ?  ?  Passed - HBA1C is between 0 and 7.9 and within 180 days  ?  Hemoglobin A1C  ?Date Value Ref Range Status  ?04/19/2016 6.6  Final  ? ?HB A1C (BAYER DCA - WAIVED)  ?Date Value Ref Range Status  ?05/11/2021 6.5 (H) 4.8 - 5.6 % Final  ?  Comment:  ?           Prediabetes: 5.7 - 6.4 ?         Diabetes: >6.4 ?         Glycemic control for adults with diabetes: <7.0 ?  ?  ?  ?  ?  Passed - Valid encounter within last 6 months  ?  Recent Outpatient Visits   ? ?      ? 5 months ago Diabetes mellitus type 2, diet-controlled ([place])  ? [facility] [place], [place] T, NP  ? 9 months ago Diabetes mellitus type 2, diet-controlled ([place])  ? [place] P, DO  ? 12 months ago Diabetes mellitus type 2, diet-controlled ([place])  ? [place], DO  ? 2 years ago Essential hypertension  ? [place], [place] P, DO  ? 2 years ago Seasonal allergic rhinitis due to pollen  ? [place], [place] P, DO  ? ?  ?  ?Future Appointments   ? ?        ? In 2 months End, Harrell Gave, MD [facility], [facility]  ? ?   ? ? ?  ?  ?  Passed - CBC within normal limits and completed in the last  12 months  ?  WBC  ?Date Value Ref Range Status  ?05/11/2021 8.7 3.4 - 10.8 x10E3/uL Final  ?02/20/2019 8.2 4.0 - 10.5 K/uL Final  ? ?RBC  ?Date Value Ref Range Status  ?05/11/2021 4.86 4.14 - 5.80 x10E6/uL Final  ?02/20/2019 4.73 4.22 - 5.81 MIL/uL Final  ? ?Hemoglobin  ?Date Value Ref Range Status  ?05/11/2021 13.6 13.0 - 17.7 g/dL Final  ? ?Hematocrit  ?Date Value Ref Range Status  ?05/11/2021 42.2 37.5 - 51.0 % Final  ? ?MCHC  ?Date Value Ref Range Status  ?05/11/2021 32.2 31.5 - 35.7 g/dL Final  ?02/20/2019 32.1 30.0 - 36.0 g/dL Final  ? ?MCH  ?Date Value Ref Range Status  ?05/11/2021 28.0 26.6 - 33.0 pg Final  ?02/20/2019 29.8 26.0 - 34.0 pg Final  ? ?MCV  ?Date Value Ref Range Status  ?05/11/2021 87 79 - 97 fL Final  ?05/25/2014 88 80 - 100 fL Final  ? ?No results found for: PLTCOUNTKUC, LABPLAT, [place] ?RDW  ?Date Value Ref Range Status  ?05/11/2021 14.0 11.6 - 15.4 % Final  ?05/25/2014 13.9 11.5 - 14.5 % Final  ? ?  ?  ?  ? allopurinol (ZYLOPRIM) 100 MG tablet 90 tablet 0  ?  Sig: Take 1 tablet by mouth once daily  ?  ? Endocrinology:  Gout Agents - allopurinol Failed - 10/21/2021  6:19 PM  ?  ?  Failed - Cr in normal range and within 360 days  ?  Creatinine  ?Date Value Ref Range Status  ?05/25/2014 0.85 0.60 - 1.30 mg/dL Final  ? ?Creatinine, Ser  ?Date Value Ref Range Status  ?10/12/2021 1.43 (H) 0.76 - 1.27 mg/dL Final  ?  ?  ?  ?  Passed - Uric Acid in normal range and within 360 days  ?  Uric Acid  ?Date Value Ref Range Status  ?05/11/2021 7.7 3.8 - 8.4 mg/dL Final  ?  Comment:  ?             Therapeutic target for gout patients: <6.0  ?  ?  ?  ?  Passed - Valid encounter within last 12 months  ?  Recent Outpatient Visits   ? ?      ? 5 months ago Diabetes mellitus type 2, diet-controlled ([place])  ? [facility] [place], [place] T, NP  ? 9 months ago Diabetes mellitus type 2, diet-controlled ([place])  ? [place] P, DO  ? 12 months ago Diabetes mellitus type 2, diet-controlled ([place])  ? [place], DO  ? 2 years ago Essential hypertension  ? [place]

## 2021-10-28 ENCOUNTER — Telehealth: Payer: Self-pay | Admitting: Family Medicine

## 2021-10-28 ENCOUNTER — Ambulatory Visit: Payer: Medicare Other | Admitting: Family Medicine

## 2021-10-28 ENCOUNTER — Other Ambulatory Visit: Payer: Self-pay | Admitting: Family Medicine

## 2021-10-28 NOTE — Telephone Encounter (Signed)
Patients wife came to check patient in for an appointment. She was under the impression that it was with today with Jolene, I explained to her that her appointment for today with Dr. Wynetta Emery was canceled by the [facility]  and she stated that she called to canceled it and wanted to be seen by [facility] ( which was scheduled for 5/3) so that he could get the medication that Dr. Wynetta Emery would not give, and that he did not want to continue seeing Dr.[name] I told her that  Jolene's Schedule was full an appoint was made for 5/3. ?

## 2021-10-28 NOTE — Telephone Encounter (Addendum)
I'm not sure what medication they are referring to. Patient should have refills on everything. He was supposed to follow up in January, and did not and only made this appointment when we called him to come in. He is due for his diabetic follow up.  ? ?Jolene has discussed with patient at his previous appointment and was not comfortable with him changing providers within the office. It is fine if he does not want to continue to see me. Although I'm happy to continue to see him. However, he may want to seek care elsewhere as Henrine Screws is not comfortable becoming his PCP.  ?

## 2021-10-31 NOTE — Telephone Encounter (Signed)
Attempted to call patient left detail message on voice mail with provider recommendations. ?

## 2021-11-01 NOTE — Telephone Encounter (Signed)
Pt returned office call. Advised pt of the below. Cancelled pt's apt with Jolene and scheduled diabetes follow up with Dr. Wynetta Emery instead. Pt says that he is okay with seeing provider Wynetta Emery.  ?

## 2021-11-02 ENCOUNTER — Ambulatory Visit: Payer: Medicare Other | Admitting: Nurse Practitioner

## 2021-11-07 ENCOUNTER — Ambulatory Visit: Payer: Medicare Other | Admitting: Family Medicine

## 2021-11-11 ENCOUNTER — Ambulatory Visit: Payer: Medicare Other | Admitting: Family Medicine

## 2021-11-18 DIAGNOSIS — Z952 Presence of prosthetic heart valve: Secondary | ICD-10-CM | POA: Diagnosis not present

## 2021-11-18 DIAGNOSIS — Z7901 Long term (current) use of anticoagulants: Secondary | ICD-10-CM | POA: Diagnosis not present

## 2021-11-29 LAB — POCT INR: INR: 3.2 — AB (ref 2.0–3.0)

## 2021-12-01 ENCOUNTER — Ambulatory Visit: Payer: Self-pay | Admitting: Family Medicine

## 2021-12-01 DIAGNOSIS — Z952 Presence of prosthetic heart valve: Secondary | ICD-10-CM

## 2021-12-07 LAB — POCT INR
INR: 3 (ref 2.0–3.0)
INR: 3 (ref 2–3)
INR: 3 — AB (ref 0.80–1.20)

## 2021-12-13 ENCOUNTER — Telehealth: Payer: Self-pay | Admitting: Family Medicine

## 2021-12-13 ENCOUNTER — Ambulatory Visit (INDEPENDENT_AMBULATORY_CARE_PROVIDER_SITE_OTHER): Payer: Medicare Other | Admitting: Family Medicine

## 2021-12-13 DIAGNOSIS — Z952 Presence of prosthetic heart valve: Secondary | ICD-10-CM

## 2021-12-13 NOTE — Telephone Encounter (Signed)
anticoagulation

## 2021-12-14 ENCOUNTER — Ambulatory Visit (INDEPENDENT_AMBULATORY_CARE_PROVIDER_SITE_OTHER): Payer: Medicare Other | Admitting: Surgery

## 2021-12-14 ENCOUNTER — Encounter: Payer: Self-pay | Admitting: Surgery

## 2021-12-14 ENCOUNTER — Ambulatory Visit
Admission: RE | Admit: 2021-12-14 | Discharge: 2021-12-14 | Disposition: A | Discharge status: Home / Self Care | Payer: Medicare Other | Source: Ambulatory Visit | Attending: Surgery | Admitting: Surgery

## 2021-12-14 VITALS — BP 110/72 | HR 76 | Resp 20 | Wt 320.0 lb

## 2021-12-14 DIAGNOSIS — Q231 Congenital insufficiency of aortic valve: Secondary | ICD-10-CM

## 2021-12-14 DIAGNOSIS — I7121 Aneurysm of the ascending aorta, without rupture: Secondary | ICD-10-CM

## 2021-12-14 DIAGNOSIS — I2510 Atherosclerotic heart disease of native coronary artery without angina pectoris: Secondary | ICD-10-CM | POA: Diagnosis not present

## 2021-12-14 NOTE — Progress Notes (Signed)
HPI:  The patient is a 66 year old gentleman with a history of mechanical aortic valve replacement with a 27 mm [employer] valve on 03/25/2010 in [place] for bicuspid aortic valve disease.  He said that the surgery was done urgently.  It is not known if he had an aortic aneurysm at that time but he had been followed by Dr. Murvin Natal in our practice with a 5.7 cm fusiform ascending aortic aneurysm.  He was last seen by Dr. Orvan Seen on 03/29/2020 and it was felt that he would be very high risk for redo sternotomy and replacement of his ascending aorta and continued follow-up would be the best course of action.  The patient has a history of morbid obesity, nonobstructive coronary disease, previous stroke, embolic right renal infarct, chronic systolic congestive heart failure with ejection fraction of 40 to 45%, OSA not on CPAP, and hyperlipidemia. He had a CTA of the chest in 04/2016 showing the ascending aorta to have a diameter of 51 mm and CTA of the chest in July 2020 showed it to be 5.5 x 5.3 cm.  I saw him in follow-up on 03/16/2021 and CTA of the chest showed a stable 5.7 cm fusiform ascending aortic aneurysm that was unchanged from 03/2020.  He denies any chest pain or shortness of breath.  He does not get much exercise and does not watch his diet.  His current weight is 320 pounds with a BMI of 51.65 kg/m.  Current Outpatient Medications  Medication Sig Dispense Refill   albuterol (PROVENTIL) (2.5 MG/3ML) 0.083% nebulizer solution USE 1 VIAL IN NEBULIZER EVERY 6 HOURS AS NEEDED FOR WHEEZING FOR SHORTNESS OF BREATH J44.9 150 mL 0   allopurinol (ZYLOPRIM) 100 MG tablet Take 1 tablet by mouth once daily 90 tablet 0   aspirin 81 MG chewable tablet Chew 81 mg by mouth daily.     cyclobenzaprine (FLEXERIL) 10 MG tablet Take 1 tablet (10 mg total) by mouth 3 times/day as needed-between meals & bedtime for muscle spasms. 180 tablet 0   dapagliflozin propanediol (FARXIGA) 10 MG TABS tablet Take 1 tablet  (10 mg total) by mouth daily. 90 tablet 3   EQ ALLERGY RELIEF, CETIRIZINE, 10 MG tablet Take 1 tablet by mouth once daily 90 tablet 0   fenofibrate (TRICOR) 48 MG tablet Take 1 tablet by mouth once daily 90 tablet 0   furosemide (LASIX) 40 MG tablet TAKE 2 TABLETS BY MOUTH IN THE MORNING AND 1 TABLET IN THE EVENING. 90 tablet 3   lisinopril (ZESTRIL) 5 MG tablet Take 1 tablet (5 mg total) by mouth daily. 90 tablet 3   metFORMIN (GLUCOPHAGE-XR) 500 MG 24 hr tablet Take 1 tablet by mouth once daily with breakfast 90 tablet 0   metoprolol succinate (TOPROL-XL) 100 MG 24 hr tablet TAKE 1 TABLET BY MOUTH ONCE DAILY WITH  OR  IMMEDIATELY  FOLLOWING  A  MEAL 90 tablet 3   nortriptyline (PAMELOR) 10 MG capsule Take 1 capsule (10 mg total) by mouth at bedtime. 90 capsule 1   Omega-3 Fatty Acids (FISH OIL) 1000 MG CAPS Take 5,000 mg by mouth daily.     potassium chloride SA (KLOR-CON M) 20 MEQ tablet Take 1 tablet (20 mEq total) by mouth daily. 90 tablet 3   rosuvastatin (CRESTOR) 40 MG tablet Take 1 tablet (40 mg total) by mouth daily. 90 tablet 3   spironolactone (ALDACTONE) 25 MG tablet Take 1 tablet (25 mg total) by mouth daily. 90 tablet 3  TRELEGY ELLIPTA 100-62.5-25 MCG/ACT AEPB Inhale 1 puff into the lungs daily.     VENTOLIN HFA 108 (90 Base) MCG/ACT inhaler INHALE 2 PUFFS BY MOUTH EVERY 6 HOURS AS NEEDED FOR WHEEZING OR SHORTNESS OF BREATH 54 g 0   warfarin (COUMADIN) 4 MG tablet TAKE 1 TABLET BY MOUTH ONCE DAILY AT  6PM  AS  DIRECTED 30 tablet 0   warfarin (COUMADIN) 7.5 MG tablet Take 1 tablet (7.5 mg total) by mouth daily. 4 mg for 2 days, 7.5 mg on the 3rd day, and repeat 240 tablet 1   No current facility-administered medications for this visit.     Physical Exam: BP 110/72   Pulse 76   Resp 20   Wt (!) 320 lb (145.2 kg)   SpO2 93%   BMI 51.65 kg/m  He is a morbidly obese gentleman in no distress. Cardiac exam shows regular rate and rhythm with crisp mechanical valve click.  There  is no murmur. Lungs are clear.  Diagnostic Tests:  Narrative & Impression  CLINICAL DATA:  Thoracic aortic aneurysm.   EXAM: CT CHEST WITHOUT CONTRAST   TECHNIQUE: Multidetector CT imaging of the chest was performed following the standard protocol without IV contrast.   RADIATION DOSE REDUCTION: This exam was performed according to the departmental dose-optimization program which includes automated exposure control, adjustment of the mA and/or kV according to patient size and/or use of iterative reconstruction technique.   COMPARISON:  March 16, 2021.   FINDINGS: Cardiovascular: Status post aortic valve replacement. Grossly stable 5.6 cm ascending thoracic aortic aneurysm is noted. Normal cardiac size. No pericardial effusion. Mild coronary artery calcifications are noted.   Mediastinum/Nodes: No enlarged mediastinal or axillary lymph nodes. Thyroid gland, trachea, and esophagus demonstrate no significant findings.   Lungs/Pleura: No pneumothorax or pleural effusion is noted. Stable 7 mm nodule seen in left upper lobe which can be considered benign at this point. No acute pulmonary disease is noted.   Upper Abdomen: No acute abnormality.   Musculoskeletal: No chest wall mass or suspicious bone lesions identified.   IMPRESSION: Grossly stable 5.6 cm poststenotic dilatation of ascending thoracic aorta. Status post aortic valve repair. Follow-up CT scan in 6-12 months is recommended to ensure stability.   Mild coronary artery calcifications are noted.     Electronically Signed   By: Marijo Conception M.D.   On: 12/14/2021 10:59      Impression:  He has a stable 5.6 cm fusiform ascending aortic aneurysm status post mechanical aortic valve replacement in 2011.  This has gradually increased in size since 2017 but appears stable at 5.6 to 5.7 cm over the past couple years.  It is above the surgical threshold of 5.5 cm but his surgical risk is certainly  significantly increased due to prior aortic valve replacement, morbid obesity, and stage III chronic kidney disease.  This will likely require replacement at some point but ideally it would be best for him to lose a significant amount of weight preoperatively to decrease his surgical risk.  I reviewed the CTA images with him and answered his questions.  I strongly advised him to get help with weight loss including dietary modification and exercise.  Plan:  I will plan to see him back in 6 months with a CTA of the chest.  I spent 20 minutes performing this established patient evaluation and > 50% of this time was spent face to face counseling and coordinating the care of this patient's aortic aneurysm.  Gaye Pollack, MD [facility] [phone]

## 2021-12-17 ENCOUNTER — Other Ambulatory Visit: Payer: Self-pay | Admitting: Family Medicine

## 2021-12-19 NOTE — Telephone Encounter (Signed)
Requested medication (s) are due for refill today: unclear, historical provider rx do not have the details  Requested medication (s) are on the active medication list: yes  Last refill:  06/25/21, no details  Future visit scheduled: 12/23/21, last seen 05/11/21  Notes to clinic:  This med states to review manually and it is also written by a historical provider, please assess.      Requested Prescriptions  Pending Prescriptions Disp Refills   TRELEGY ELLIPTA 100-62.5-25 MCG/ACT AEPB [Pharmacy Med Name: Trelegy Ellipta 100-62.5-25 MCG/INH Inhalation Aerosol Powder Breath Activated] 180 each 0    Sig: INHALE 1 PUFF INTO THE LUNGS ONCE DAILY     Off-Protocol Failed - 12/17/2021  3:03 PM      Failed - Medication not assigned to a protocol, review manually.      Passed - Valid encounter within last 12 months    Recent Outpatient Visits           7 months ago Diabetes mellitus type 2, diet-controlled ([place])   [place], [place] T, NP   10 months ago Diabetes mellitus type 2, diet-controlled ([place])   [place], [name], DO   1 year ago Diabetes mellitus type 2, diet-controlled ([place])   [place], [place], DO   2 years ago Essential hypertension   [place], [place], DO   2 years ago Seasonal allergic rhinitis due to pollen   [facility], Barb Merino, DO       Future Appointments             In 4 days Wynetta Emery, [name], DO [employer], [place]   In 1 month End, Harrell Gave, MD [place], [facility]            Signed Prescriptions Disp Refills   albuterol (VENTOLIN HFA) 108 (90 Base) MCG/ACT inhaler 54 g 0    Sig: INHALE 2 PUFFS EVERY 6 HOURS AS NEEDED FOR WHEEZING FOR SHORTNESS OF BREATH     Pulmonology:  Beta Agonists 2 Passed - 12/17/2021  3:03 PM      Passed - Last BP in normal range    BP Readings from Last 1 Encounters:  12/14/21 110/72          Passed - Last Heart Rate in normal range    Pulse Readings from Last 1 Encounters:  12/14/21 76         Passed - Valid encounter within last 12 months    Recent Outpatient Visits           7 months ago Diabetes mellitus type 2, diet-controlled ([place])   [place], Jolene T, NP   10 months ago Diabetes mellitus type 2, diet-controlled ([place])   [place], [name], DO   1 year ago Diabetes mellitus type 2, diet-controlled ([place])   [place], [place], DO   2 years ago Essential hypertension   [place], [place], DO   2 years ago Seasonal allergic rhinitis due to pollen   [facility], Barb Merino, DO       Future Appointments             In 4 days Wynetta Emery, Barb Merino, DO [employer], [place]   In 1 month End, Harrell Gave, MD [facility], [place]

## 2021-12-19 NOTE — Telephone Encounter (Signed)
Trelegy not assigned to a protocol. Requested Prescriptions  Pending Prescriptions Disp Refills  . TRELEGY ELLIPTA 100-62.5-25 MCG/ACT AEPB [Pharmacy Med Name: Trelegy Ellipta 100-62.5-25 MCG/INH Inhalation Aerosol Powder Breath Activated] 180 each 0    Sig: INHALE 1 PUFF INTO THE LUNGS ONCE DAILY     Off-Protocol Failed - 12/17/2021  3:03 PM      Failed - Medication not assigned to a protocol, review manually.      Passed - Valid encounter within last 12 months    Recent Outpatient Visits          7 months ago Diabetes mellitus type 2, diet-controlled ([place])   [place], [place] T, NP   10 months ago Diabetes mellitus type 2, diet-controlled ([place])   [place], [name], DO   1 year ago Diabetes mellitus type 2, diet-controlled ([place])   [place], Merna, DO   2 years ago Essential hypertension   [place], [place], DO   2 years ago Seasonal allergic rhinitis due to pollen   [facility], Barb Merino, DO      Future Appointments            In 4 days Wynetta Emery, Barb Merino, DO [employer], [place]   In 1 month End, Harrell Gave, MD [facility], [place]           . albuterol (VENTOLIN HFA) 108 (90 Base) MCG/ACT inhaler [Pharmacy Med Name: Ventolin HFA 108 (90 Base) MCG/ACT Inhalation Aerosol Solution] 54 g 0    Sig: INHALE 2 PUFFS EVERY 6 HOURS AS NEEDED FOR WHEEZING FOR SHORTNESS OF BREATH     Pulmonology:  Beta Agonists 2 Passed - 12/17/2021  3:03 PM      Passed - Last BP in normal range    BP Readings from Last 1 Encounters:  12/14/21 110/72         Passed - Last Heart Rate in normal range    Pulse Readings from Last 1 Encounters:  12/14/21 76         Passed - Valid encounter within last 12 months    Recent Outpatient Visits          7 months ago Diabetes mellitus type 2, diet-controlled ([place])   [place], [place] T, NP    10 months ago Diabetes mellitus type 2, diet-controlled ([place])   [place], [name], DO   1 year ago Diabetes mellitus type 2, diet-controlled ([place])   [place], [place], DO   2 years ago Essential hypertension   [place], [place], DO   2 years ago Seasonal allergic rhinitis due to pollen   [facility], Barb Merino, DO      Future Appointments            In 4 days Wynetta Emery, Barb Merino, DO [employer], [place]   In 1 month End, Harrell Gave, MD [facility], [place]

## 2021-12-19 NOTE — Telephone Encounter (Signed)
Does he have enough to make it to 6/23?

## 2021-12-23 ENCOUNTER — Ambulatory Visit (INDEPENDENT_AMBULATORY_CARE_PROVIDER_SITE_OTHER): Payer: Medicare Other | Admitting: Family Medicine

## 2021-12-23 ENCOUNTER — Encounter: Payer: Self-pay | Admitting: Family Medicine

## 2021-12-23 VITALS — BP 117/74 | HR 55 | Temp 98.6°F | Wt 321.8 lb

## 2021-12-23 DIAGNOSIS — R3911 Hesitancy of micturition: Secondary | ICD-10-CM | POA: Diagnosis not present

## 2021-12-23 DIAGNOSIS — E1159 Type 2 diabetes mellitus with other circulatory complications: Secondary | ICD-10-CM | POA: Diagnosis not present

## 2021-12-23 DIAGNOSIS — I2510 Atherosclerotic heart disease of native coronary artery without angina pectoris: Secondary | ICD-10-CM

## 2021-12-23 DIAGNOSIS — I38 Endocarditis, valve unspecified: Secondary | ICD-10-CM

## 2021-12-23 DIAGNOSIS — E785 Hyperlipidemia, unspecified: Secondary | ICD-10-CM

## 2021-12-23 DIAGNOSIS — I152 Hypertension secondary to endocrine disorders: Secondary | ICD-10-CM | POA: Diagnosis not present

## 2021-12-23 DIAGNOSIS — F17210 Nicotine dependence, cigarettes, uncomplicated: Secondary | ICD-10-CM | POA: Diagnosis not present

## 2021-12-23 DIAGNOSIS — N280 Ischemia and infarction of kidney: Secondary | ICD-10-CM

## 2021-12-23 DIAGNOSIS — E119 Type 2 diabetes mellitus without complications: Secondary | ICD-10-CM | POA: Diagnosis not present

## 2021-12-23 DIAGNOSIS — J432 Centrilobular emphysema: Secondary | ICD-10-CM | POA: Diagnosis not present

## 2021-12-23 DIAGNOSIS — E1169 Type 2 diabetes mellitus with other specified complication: Secondary | ICD-10-CM

## 2021-12-23 DIAGNOSIS — M1A9XX0 Chronic gout, unspecified, without tophus (tophi): Secondary | ICD-10-CM | POA: Diagnosis not present

## 2021-12-23 LAB — URINALYSIS, ROUTINE W REFLEX MICROSCOPIC
Bilirubin, UA: NEGATIVE
Ketones, UA: NEGATIVE
Leukocytes,UA: NEGATIVE
Nitrite, UA: NEGATIVE
Protein,UA: NEGATIVE
Specific Gravity, UA: 1.02 (ref 1.005–1.030)
Urobilinogen, Ur: 0.2 mg/dL (ref 0.2–1.0)
pH, UA: 5.5 (ref 5.0–7.5)

## 2021-12-23 LAB — BAYER DCA HB A1C WAIVED: HB A1C (BAYER DCA - WAIVED): 6.3 % — ABNORMAL HIGH (ref 4.8–5.6)

## 2021-12-23 LAB — MICROALBUMIN, URINE WAIVED
Creatinine, Urine Waived: 100 mg/dL (ref 10–300)
Microalb, Ur Waived: 10 mg/L (ref 0–19)
Microalb/Creat Ratio: <30 mg/g (ref <30)

## 2021-12-23 LAB — MICROSCOPIC EXAMINATION
Bacteria, UA: NONE SEEN
RBC, Urine: NONE SEEN /hpf (ref 0–2)

## 2021-12-23 MED ORDER — ALBUTEROL SULFATE HFA 108 (90 BASE) MCG/ACT IN AERS: INHALATION_SPRAY | RESPIRATORY_TRACT | 6 refills | Status: DC

## 2021-12-23 MED ORDER — OZEMPIC (0.25 OR 0.5 MG/DOSE) 2 MG/3ML ~~LOC~~ SOPN: PEN_INJECTOR | SUBCUTANEOUS | 1 refills | Status: DC

## 2021-12-23 MED ORDER — METFORMIN HCL ER 500 MG PO TB24
500.0000 mg | ORAL_TABLET | Freq: Every day | ORAL | 1 refills | Status: DC
Start: 2021-12-23 — End: 2022-07-19

## 2021-12-23 MED ORDER — TRELEGY ELLIPTA 100-62.5-25 MCG/ACT IN AEPB
1.0000 | INHALATION_SPRAY | Freq: Every day | RESPIRATORY_TRACT | 3 refills | Status: DC
Start: 2021-12-23 — End: 2022-10-12

## 2021-12-23 MED ORDER — FENOFIBRATE 48 MG PO TABS: 48.0000 mg | ORAL_TABLET | Freq: Every day | ORAL | 1 refills | Status: DC

## 2021-12-23 MED ORDER — ALLOPURINOL 100 MG PO TABS
100.0000 mg | ORAL_TABLET | Freq: Every day | ORAL | 1 refills | Status: DC
Start: 2021-12-23 — End: 2021-12-26

## 2021-12-23 MED ORDER — BREZTRI AEROSPHERE 160-9-4.8 MCG/ACT IN AERO: 2.0000 | INHALATION_SPRAY | Freq: Two times a day (BID) | RESPIRATORY_TRACT | 11 refills | Status: DC

## 2021-12-23 NOTE — Assessment & Plan Note (Signed)
Continue warfarin. Testing weekly. Has been very stable.

## 2021-12-23 NOTE — Assessment & Plan Note (Signed)
Follows with cardiology. Continue to keep BP and cholesterol under good control. Call with any concerns.

## 2021-12-23 NOTE — Assessment & Plan Note (Signed)
Under good control on current regimen. Continue current regimen. Continue to monitor. Call with any concerns. Refills given and up to date through cardiology.

## 2021-12-23 NOTE — Assessment & Plan Note (Signed)
Under good control on current regimen. Continue current regimen. Continue to monitor. Call with any concerns. Refills through cardiology and given today. Labs drawn today.

## 2021-12-23 NOTE — Assessment & Plan Note (Signed)
Having some issues with his breathing. Will try breztri to see if he likes it. Sample given today. Refill of trelegy given. Call with any concerns.

## 2021-12-24 LAB — CBC WITH DIFFERENTIAL/PLATELET
Basophils Absolute: 0.1 10*3/uL (ref 0.0–0.2)
Basos: 1 %
EOS (ABSOLUTE): 0.3 10*3/uL (ref 0.0–0.4)
Eos: 4 %
Hematocrit: 42.9 % (ref 37.5–51.0)
Hemoglobin: 14.1 g/dL (ref 13.0–17.7)
Immature Grans (Abs): 0 10*3/uL (ref 0.0–0.1)
Immature Granulocytes: 0 %
Lymphocytes Absolute: 1.9 10*3/uL (ref 0.7–3.1)
Lymphs: 25 %
MCH: 28.5 pg (ref 26.6–33.0)
MCHC: 32.9 g/dL (ref 31.5–35.7)
MCV: 87 fL (ref 79–97)
Monocytes Absolute: 0.7 10*3/uL (ref 0.1–0.9)
Monocytes: 10 %
Neutrophils Absolute: 4.6 10*3/uL (ref 1.4–7.0)
Neutrophils: 60 %
Platelets: 249 10*3/uL (ref 150–450)
RBC: 4.95 x10E6/uL (ref 4.14–5.80)
RDW: 13.6 % (ref 11.6–15.4)
WBC: 7.6 10*3/uL (ref 3.4–10.8)

## 2021-12-24 LAB — COMPREHENSIVE METABOLIC PANEL
ALT: 24 IU/L (ref 0–44)
AST: 22 IU/L (ref 0–40)
Albumin/Globulin Ratio: 1.6 (ref 1.2–2.2)
Albumin: 4.4 g/dL (ref 3.8–4.8)
Alkaline Phosphatase: 41 IU/L — ABNORMAL LOW (ref 44–121)
BUN/Creatinine Ratio: 17 (ref 10–24)
BUN: 24 mg/dL (ref 8–27)
Bilirubin Total: 0.2 mg/dL (ref 0.0–1.2)
CO2: 21 mmol/L (ref 20–29)
Calcium: 9.5 mg/dL (ref 8.6–10.2)
Chloride: 102 mmol/L (ref 96–106)
Creatinine, Ser: 1.38 mg/dL — ABNORMAL HIGH (ref 0.76–1.27)
Globulin, Total: 2.7 g/dL (ref 1.5–4.5)
Glucose: 105 mg/dL — ABNORMAL HIGH (ref 70–99)
Potassium: 4.5 mmol/L (ref 3.5–5.2)
Sodium: 140 mmol/L (ref 134–144)
Total Protein: 7.1 g/dL (ref 6.0–8.5)
eGFR: 56 mL/min/{1.73_m2} — ABNORMAL LOW (ref >59)

## 2021-12-24 LAB — PSA: Prostate Specific Ag, Serum: 0.1 ng/mL (ref 0.0–4.0)

## 2021-12-24 LAB — LIPID PANEL W/O CHOL/HDL RATIO
Cholesterol, Total: 161 mg/dL (ref 100–199)
HDL: 37 mg/dL — ABNORMAL LOW (ref >39)
LDL Chol Calc (NIH): 57 mg/dL (ref 0–99)
Triglycerides: 444 mg/dL — ABNORMAL HIGH (ref 0–149)
VLDL Cholesterol Cal: 67 mg/dL — ABNORMAL HIGH (ref 5–40)

## 2021-12-24 LAB — URIC ACID: Uric Acid: 7.3 mg/dL (ref 3.8–8.4)

## 2021-12-24 LAB — TSH: TSH: 1.24 u[IU]/mL (ref 0.450–4.500)

## 2021-12-26 ENCOUNTER — Other Ambulatory Visit: Payer: Self-pay | Admitting: Family Medicine

## 2021-12-26 MED ORDER — ALLOPURINOL 300 MG PO TABS
300.0000 mg | ORAL_TABLET | Freq: Every day | ORAL | 1 refills | Status: DC
Start: 2021-12-26 — End: 2022-06-06

## 2021-12-27 DIAGNOSIS — Z7901 Long term (current) use of anticoagulants: Secondary | ICD-10-CM | POA: Diagnosis not present

## 2021-12-27 DIAGNOSIS — Z952 Presence of prosthetic heart valve: Secondary | ICD-10-CM | POA: Diagnosis not present

## 2021-12-27 LAB — POCT INR: INR: 3.2 — AB (ref 2.0–3.0)

## 2021-12-29 ENCOUNTER — Ambulatory Visit (INDEPENDENT_AMBULATORY_CARE_PROVIDER_SITE_OTHER): Payer: Medicare Other | Admitting: Family Medicine

## 2021-12-29 DIAGNOSIS — Z952 Presence of prosthetic heart valve: Secondary | ICD-10-CM | POA: Diagnosis not present

## 2022-01-04 LAB — POCT INR: INR: 3.2 — AB (ref 2.0–3.0)

## 2022-01-06 ENCOUNTER — Ambulatory Visit: Payer: Self-pay | Admitting: Family Medicine

## 2022-01-06 DIAGNOSIS — Z952 Presence of prosthetic heart valve: Secondary | ICD-10-CM

## 2022-01-12 LAB — POCT INR: INR: 3 — AB (ref <=1.20)

## 2022-01-17 ENCOUNTER — Ambulatory Visit: Payer: Self-pay | Admitting: Family Medicine

## 2022-01-17 DIAGNOSIS — Z952 Presence of prosthetic heart valve: Secondary | ICD-10-CM

## 2022-01-18 ENCOUNTER — Ambulatory Visit: Payer: Medicare Other | Admitting: Internal Medicine

## 2022-01-20 ENCOUNTER — Other Ambulatory Visit: Payer: Self-pay | Admitting: Family Medicine

## 2022-01-23 LAB — PROTIME-INR: INR: 2.8 — AB (ref 0.80–1.20)

## 2022-01-23 NOTE — Telephone Encounter (Signed)
Requested Prescriptions  Pending Prescriptions Disp Refills  . EQ ALLERGY RELIEF, CETIRIZINE, 10 MG tablet [Pharmacy Med Name: EQ Allergy Relief (Cetirizine) 10 MG Oral Tablet] 90 tablet 0    Sig: Take 1 tablet by mouth once daily     Ear, Nose, and Throat:  Antihistamines 2 Failed - 01/20/2022  5:34 PM      Failed - Cr in normal range and within 360 days    Creatinine  Date Value Ref Range Status  05/25/2014 0.85 0.60 - 1.30 mg/dL Final   Creatinine, Ser  Date Value Ref Range Status  12/23/2021 1.38 (H) 0.76 - 1.27 mg/dL Final         Passed - Valid encounter within last 12 months    Recent Outpatient Visits          1 month ago Hypertension associated with diabetes ([place])   [place], [name], DO   8 months ago Diabetes mellitus type 2, diet-controlled ([place])   [place], [place] T, NP   12 months ago Diabetes mellitus type 2, diet-controlled ([place])   [place], [name], DO   1 year ago Diabetes mellitus type 2, diet-controlled ([place])   [place], [place], DO   2 years ago Essential hypertension   [place], Hensley, DO      Future Appointments            In 2 months Wynetta Emery, Barb Merino, DO [place], [place]   In 2 months End, Harrell Gave, MD [facility], [place]

## 2022-01-26 ENCOUNTER — Ambulatory Visit (INDEPENDENT_AMBULATORY_CARE_PROVIDER_SITE_OTHER): Payer: Medicare Other | Admitting: Family Medicine

## 2022-01-26 DIAGNOSIS — Z952 Presence of prosthetic heart valve: Secondary | ICD-10-CM

## 2022-01-26 NOTE — Progress Notes (Signed)
  Anticoagulation Episode Summary     Current INR goal:  2.5-3.5  TTR:  100.0 % (2.1 wk)  Next INR check:  02/02/2022  INR from last check:  2.80 (01/23/2022)  Weekly max warfarin dose:    Target end date:  Indefinite  INR check location:  Home Draw  Preferred lab:  OTHER  Send INR reminders to:     Indications   H/O mechanical aortic valve replacement [Z95.2]        Comments:

## 2022-02-03 DIAGNOSIS — Z7901 Long term (current) use of anticoagulants: Secondary | ICD-10-CM | POA: Diagnosis not present

## 2022-02-03 DIAGNOSIS — Z952 Presence of prosthetic heart valve: Secondary | ICD-10-CM | POA: Diagnosis not present

## 2022-02-03 LAB — POCT INR: INR: 3.2 — AB (ref 0.80–1.20)

## 2022-02-13 ENCOUNTER — Ambulatory Visit: Payer: Self-pay | Admitting: Family Medicine

## 2022-02-13 DIAGNOSIS — Z952 Presence of prosthetic heart valve: Secondary | ICD-10-CM

## 2022-02-14 LAB — PROTIME-INR: INR: 2.6 — AB (ref 0.80–1.20)

## 2022-02-17 ENCOUNTER — Telehealth (INDEPENDENT_AMBULATORY_CARE_PROVIDER_SITE_OTHER): Payer: Medicare Other | Admitting: Internal Medicine

## 2022-02-17 ENCOUNTER — Encounter: Payer: Self-pay | Admitting: Internal Medicine

## 2022-02-17 ENCOUNTER — Other Ambulatory Visit: Payer: Self-pay | Admitting: Family Medicine

## 2022-02-17 ENCOUNTER — Telehealth: Payer: Self-pay

## 2022-02-17 VITALS — BP 101/78 | HR 94 | Ht 66.0 in | Wt 312.0 lb

## 2022-02-17 DIAGNOSIS — I2510 Atherosclerotic heart disease of native coronary artery without angina pectoris: Secondary | ICD-10-CM

## 2022-02-17 DIAGNOSIS — E1169 Type 2 diabetes mellitus with other specified complication: Secondary | ICD-10-CM

## 2022-02-17 DIAGNOSIS — I5022 Chronic systolic (congestive) heart failure: Secondary | ICD-10-CM | POA: Diagnosis not present

## 2022-02-17 DIAGNOSIS — Z952 Presence of prosthetic heart valve: Secondary | ICD-10-CM | POA: Diagnosis not present

## 2022-02-17 DIAGNOSIS — E785 Hyperlipidemia, unspecified: Secondary | ICD-10-CM | POA: Diagnosis not present

## 2022-02-17 DIAGNOSIS — I7121 Aneurysm of the ascending aorta, without rupture: Secondary | ICD-10-CM | POA: Diagnosis not present

## 2022-02-17 NOTE — Patient Instructions (Addendum)
Medication Instructions:   Your physician recommends that you continue on your current medications as directed. Please refer to the Current Medication list given to you today.  *If you need a refill on your cardiac medications before your next appointment, please call your pharmacy*   Lab Work:  None ordered  Testing/Procedures:  Your physician has requested that you have an echocardiogram in 3 months (shortly before follow up appointment). Echocardiography is a painless test that uses sound waves to create images of your heart. It provides your doctor with information about the size and shape of your heart and how well your heart's chambers and valves are working. This procedure takes approximately one hour. There are no restrictions for this procedure.    Follow-Up: At [facility], you and your health needs are our priority.  As part of our continuing mission to provide you with exceptional heart care, we have created designated Provider Care Teams.  These Care Teams include your primary Cardiologist (physician) and Advanced Practice Providers (APPs -  Physician Assistants and Nurse Practitioners) who all work together to provide you with the care you need, when you need it.  We recommend signing up for the patient portal called "MyChart".  Sign up information is provided on this After Visit Summary.  MyChart is used to connect with patients for Virtual Visits (Telemedicine).  Patients are able to view lab/test results, encounter notes, upcoming appointments, etc.  Non-urgent messages can be sent to your provider as well.   To learn more about what you can do with MyChart, go to [email or web address].    Your next appointment:    3 month(s) shortly after echocardiogram  The format for your next appointment:   In Person  Provider:   You may see Nelva Bush, MD or one of the following Advanced Practice Providers on your designated Care Team:   Murray Hodgkins, NP Christell Faith, [name] Cadence Kathlen Mody, [place]    Important Information About Sugar

## 2022-02-17 NOTE — Telephone Encounter (Signed)
Requested Prescriptions  Pending Prescriptions Disp Refills  . warfarin (COUMADIN) 4 MG tablet [Pharmacy Med Name: Warfarin Sodium 4 MG Oral Tablet] 30 tablet 0    Sig: TAKE 1 TABLET BY MOUTH ONCE DAILY AT  6PM  AS  DIRECTED     Hematology:  Anticoagulants - warfarin Failed - 02/17/2022  9:41 AM      Failed - Manual Review: If patient's warfarin is managed by Anti-Coag team, route request to them. If not, route request to the provider.      Failed - INR in normal range and within 30 days    INR  Date Value Ref Range Status  02/03/2022 3.20 (A) 0.80 - 1.20 Final  05/11/2021 3.6 (H) 0.9 - 1.1 Final  05/25/2014 1.9  Final    Comment:    INR reference interval applies to patients on anticoagulant therapy. A single INR therapeutic range for coumarins is not optimal for all indications; however, the suggested range for most indications is 2.0 - 3.0. Exceptions to the INR Reference Range may include: Prosthetic heart valves, acute myocardial infarction, prevention of myocardial infarction, and combinations of aspirin and anticoagulant. The need for a higher or lower target INR must be assessed individually. Reference: The Pharmacology and Management of the Vitamin K  antagonists: the seventh ACCP Conference on Antithrombotic and Thrombolytic Therapy. EYCXK.4818 Sept:126 (3suppl): N9146842. A HCT value >55% may artifactually increase the PT.  In one study,  the increase was an average of 25%. Reference:  "Effect on Routine and Special Coagulation Testing Values of Citrate Anticoagulant Adjustment in Patients with High HCT Values." American Journal of Clinical Pathology 2006;126:400-405.          Passed - HCT in normal range and within 360 days    Hematocrit  Date Value Ref Range Status  12/23/2021 42.9 37.5 - 51.0 % Final         Passed - Patient is not pregnant      Passed - Valid encounter within last 3 months    Recent Outpatient Visits          1 month ago Hypertension  associated with diabetes ([place])   [place], [name], DO   9 months ago Diabetes mellitus type 2, diet-controlled ([place])   [place], [place] T, NP   1 year ago Diabetes mellitus type 2, diet-controlled ([place])   [place], [name], DO   1 year ago Diabetes mellitus type 2, diet-controlled ([place])   [place], [place], DO   2 years ago Essential hypertension   [place], [place]z, DO      Future Appointments            In 1 month [name], Barb Merino, DO [place], [place]   In 1 month End, Harrell Gave, MD [facility], [place]           . warfarin (COUMADIN) 7.5 MG tablet [Pharmacy Med Name: Warfarin Sodium 7.5 MG Oral Tablet] 30 tablet 0    Sig: Take 1 tablet (7.5 mg total) by mouth 2 (two) times a week for 1 dose. Tuesdays and Fridays     Hematology:  Anticoagulants - warfarin Failed - 02/17/2022  9:41 AM      Failed - Manual Review: If patient's warfarin is managed by Anti-Coag team, route request to them. If not, route request to the provider.      Failed - INR in normal range  and within 30 days    INR  Date Value Ref Range Status  02/03/2022 3.20 (A) 0.80 - 1.20 Final  05/11/2021 3.6 (H) 0.9 - 1.1 Final  05/25/2014 1.9  Final    Comment:    INR reference interval applies to patients on anticoagulant therapy. A single INR therapeutic range for coumarins is not optimal for all indications; however, the suggested range for most indications is 2.0 - 3.0. Exceptions to the INR Reference Range may include: Prosthetic heart valves, acute myocardial infarction, prevention of myocardial infarction, and combinations of aspirin and anticoagulant. The need for a higher or lower target INR must be assessed individually. Reference: The Pharmacology and Management of the Vitamin K  antagonists: the seventh ACCP Conference on Antithrombotic  and Thrombolytic Therapy. [id number] Sept:126 (3suppl): [id number]. A HCT value >55% may artifactually increase the PT.  In one study,  the increase was an average of 25%. Reference:  "Effect on Routine and Special Coagulation Testing Values of Citrate Anticoagulant Adjustment in Patients with High HCT Values." American Journal of Clinical Pathology 2006;126:400-405.          Passed - HCT in normal range and within 360 days    Hematocrit  Date Value Ref Range Status  12/23/2021 42.9 37.5 - 51.0 % Final         Passed - Patient is not pregnant      Passed - Valid encounter within last 3 months    Recent Outpatient Visits          1 month ago Hypertension associated with diabetes ([place])   [place], [name], DO   9 months ago Diabetes mellitus type 2, diet-controlled ([place])   [place], [place] T, NP   1 year ago Diabetes mellitus type 2, diet-controlled ([place])   [place], [name], DO   1 year ago Diabetes mellitus type 2, diet-controlled ([place])   [place], [place], DO   2 years ago Essential hypertension   [place], [place], DO      Future Appointments            In 1 month [name], Barb Merino, DO [place], [place]   In 1 month End, Harrell Gave, MD [facility], [place]

## 2022-02-17 NOTE — Progress Notes (Signed)
Virtual Visit via Telephone Note   Because of Roberto [name]'s co-morbid illnesses, he is at least at moderate risk for complications without adequate follow up.  This format is felt to be most appropriate for this patient at this time.  The patient did not have access to video technology/had technical difficulties with video requiring transitioning to audio format only (telephone).  All issues noted in this document were discussed and addressed.  No physical exam could be performed with this format.  Please refer to the patient's chart for his consent to telehealth for [name].    Date:  02/17/2022   ID:  Roberto [name], DOB 12-Jul-1955, MRN [id number] The patient was identified using 2 identifiers.  Patient Location: Home Provider Location: Home Office   PCP:  Valerie Roys, DO   [place] Providers Cardiologist:  Nelva Bush, MD Sleep Medicine:  Fransico Him, MD {  Evaluation Performed:  Follow-Up Visit  Chief Complaint: Follow-up heart failure and thoracic aortic aneurysm  History of Present Illness:    Roberto [name] is a 66 y.o. male with history of mechanical aortic valve replacement, non-obstructive coronary artery disease, thoracic aortic aneurysm (followed by TCTS), stroke, right renal infarct, chronic systolic heart failure (LVEF 40-45%), hyperlipidemia, obstructive sleep apnea not using CPAP, and morbid obesity, who presents for follow-up of valvular heart disease, cardiomyopathy, and thoracic aortic aneurysm.  We are speaking for follow-up of chronic HFrEF, TAA, and aortic valve disease.  I last saw him in the office in April, at which time he was doing fairly well.  He had not been able to exercise at the gym as much as he would have liked due to recurrent gout flairs.  He saw Dr. Cyndia Bent in June, at which time his ascending aortic aneurysm was stable in size at 5.6 cm.  Today, Roberto [name] actually reports feeling quite well.  His only issue was  a "knee incident" about 2 to 3 weeks ago.  His right knee gave out on him 3 times in the same day, once leading to a fall.  He did not suffer any significant injuries..  His knee has improved though he still has some difficulty climbing stairs.  He has not had any further falls.  From a heart standpoint, he is doing well without chest pain, shortness of breath, palpitations, lightheadedness, edema, or bleeding.  He has not had any further gout flares since Dr. Wynetta Emery increased allopurinol to 300 mg daily in June.  He continues to lose weight through dietary changes.  He has not been exercising on account of his knee issues but hopes to start doing this again soon.  Past Medical History:  Diagnosis Date   Bicuspid aortic valve    a. s/p #27 Carbomedics mechanical valve on 03/25/2010; b. on Coumadin; c. TTE 12/17: EF 40-45%, moderately dilated LV with moderate LVH, AVR well-seated with 14 mmHg gradient, peak AV velocity 2.5 m/s, mild mitral valve thickening with mild MR, mildly dilated RV with mildly reduced contraction   Cellulitis    Chronic systolic CHF (congestive heart failure) ([place])    a. R/LHC 03/2010 showed no significant CAD, LVEDP 31 mmHg, mean AoV gradient 34 mmHg at rest and 47 mmHg with dobutamine 20 mcg/kg/min, AVA 1.0 cm^2, RA 31, RV 68/25, PA 68/47, PCWP 38. PA sat 65%. CO 6.2 L/min (Fick) and 5.3 L/min (thermodilution)   Clotting disorder (HCC)    H/O mechanical aortic valve replacement 03/25/2010   a. #27 Carbomedics mechanical valve  Hypercholesterolemia    Renal infarct [facility]) 2017   Multiple right renal infarcts, likely embolic.   Stroke [facility])    TIA (transient ischemic attack) 05/2014   Past Surgical History:  Procedure Laterality Date   AORTIC VALVE REPLACEMENT     CARDIAC CATHETERIZATION  03/21/2010   No significant CAD. Severe aortic stenosis. Severely elevated left and right heart filling pressures.   CARDIAC SURGERY  2009   CHF   CARPAL TUNNEL RELEASE Left 2005    RIGHT HEART CATH AND CORONARY ANGIOGRAPHY N/A 01/28/2019   Procedure: RIGHT HEART CATH AND CORONARY ANGIOGRAPHY;  Surgeon: Nelva Bush, MD;  Location: [place] CV LAB;  Service: Cardiovascular;  Laterality: N/A;   TONSILLECTOMY  1962     Current Meds  Medication Sig   albuterol (PROVENTIL) (2.5 MG/3ML) 0.083% nebulizer solution USE 1 VIAL IN NEBULIZER EVERY 6 HOURS AS NEEDED FOR WHEEZING FOR SHORTNESS OF BREATH J44.9   albuterol (VENTOLIN HFA) 108 (90 Base) MCG/ACT inhaler INHALE 2 PUFFS EVERY 6 HOURS AS NEEDED FOR WHEEZING FOR SHORTNESS OF BREATH   allopurinol (ZYLOPRIM) 300 MG tablet Take 1 tablet (300 mg total) by mouth daily.   aspirin 81 MG chewable tablet Chew 81 mg by mouth daily.   Budeson-Glycopyrrol-Formoterol (BREZTRI AEROSPHERE) 160-9-4.8 MCG/ACT AERO Inhale 2 puffs into the lungs 2 (two) times daily.   cyclobenzaprine (FLEXERIL) 10 MG tablet Take 1 tablet (10 mg total) by mouth 3 times/day as needed-between meals & bedtime for muscle spasms.   dapagliflozin propanediol (FARXIGA) 10 MG TABS tablet Take 1 tablet (10 mg total) by mouth daily.   EQ ALLERGY RELIEF, CETIRIZINE, 10 MG tablet Take 1 tablet by mouth once daily   fenofibrate (TRICOR) 48 MG tablet Take 1 tablet (48 mg total) by mouth daily.   furosemide (LASIX) 40 MG tablet TAKE 2 TABLETS BY MOUTH IN THE MORNING AND 1 TABLET IN THE EVENING.   lisinopril (ZESTRIL) 5 MG tablet Take 1 tablet (5 mg total) by mouth daily.   metFORMIN (GLUCOPHAGE-XR) 500 MG 24 hr tablet Take 1 tablet (500 mg total) by mouth daily with breakfast.   metoprolol succinate (TOPROL-XL) 100 MG 24 hr tablet TAKE 1 TABLET BY MOUTH ONCE DAILY WITH  OR  IMMEDIATELY  FOLLOWING  A  MEAL   nortriptyline (PAMELOR) 10 MG capsule Take 1 capsule (10 mg total) by mouth at bedtime.   Omega-3 Fatty Acids (FISH OIL) 1000 MG CAPS Take 5,000 mg by mouth daily.   potassium chloride SA (KLOR-CON M) 20 MEQ tablet Take 1 tablet (20 mEq total) by mouth daily.    rosuvastatin (CRESTOR) 40 MG tablet Take 1 tablet (40 mg total) by mouth daily.   Semaglutide,0.25 or 0.'5MG'$ /DOS, (OZEMPIC, 0.25 OR 0.5 MG/DOSE,) 2 MG/3ML SOPN Inject 0.25 mg into the skin once a week for 28 days, THEN 0.5 mg once a week.   spironolactone (ALDACTONE) 25 MG tablet Take 1 tablet (25 mg total) by mouth daily.   TRELEGY ELLIPTA 100-62.5-25 MCG/ACT AEPB Inhale 1 puff into the lungs daily.   warfarin (COUMADIN) 4 MG tablet TAKE 1 TABLET BY MOUTH ONCE DAILY AT  6PM  AS  DIRECTED   warfarin (COUMADIN) 7.5 MG tablet Take 1 tablet (7.5 mg total) by mouth daily. 4 mg for 2 days, 7.5 mg on the 3rd day, and repeat     Allergies:   Patient has no known allergies.   Social History   Tobacco Use   Smoking status: Former    Packs/day: 0.50  Years: 46.00    Total pack years: 23.00    Types: Cigarettes    Start date: 07/16/2017    Quit date: 05/11/2019    Years since quitting: 2.7   Smokeless tobacco: Never  Vaping Use   Vaping Use: Never used  Substance Use Topics   Alcohol use: No    Alcohol/week: 0.0 standard drinks of alcohol   Drug use: No     Family Hx: The patient's family history includes Arthritis in his father and mother; Breast cancer in his sister; Cancer in his brother; Colon cancer in his father and mother; Dementia in his mother; Diabetes in his father; Heart attack in his brother and brother; Heart disease in his brother; Seizures in his sister; Stroke in his father.  ROS:   Please see the history of present illness.   All other systems reviewed and are negative.   Prior CV studies:   The following studies were reviewed today:  CT chest (12/14/2021): Grossly stable 5.6 cm poststenotic dilatation of ascending thoracic aorta. Status post aortic valve repair. Follow-up CT scan in 6-12 months is recommended to ensure stability.  R/LHC (01/28/2019): Moderate single-vessel coronary artery disease with 60% stenosis involving large first diagonal branch.  Otherwise,  there is mild disease (up to 20%) involving the mid LAD and proximal RCA. Moderately elevated left heart and pulmonary artery pressures. Severely elevated right heart filling pressure. Mildly reduced Fick cardiac output/index.  TTE (01/02/2019):  1. Challenging image quality   2. The left ventricle has mild-moderately reduced systolic function, with  an ejection fraction of 40-45%. The cavity size was normal. Left  ventricular diastolic Doppler parameters are consistent with impaired  relaxation. Left ventricular diffuse  hypokinesis.   3. The right ventricle has normal systolic function. The cavity was  normal. There is no increase in right ventricular wall thickness. Right  ventricular systolic pressure is mildly elevated with an estimated  pressure of 35.1 mmHg.   4. Left atrial size was moderately dilated.   5. The aortic valve was not well visualized. Mild stenosis of the aortic  valve.Mean gradient 15 mm Hg.   6. There is dilatation of the ascending aorta. 4.6 cm   Labs/Other Tests and Data Reviewed:    EKG:  No ECG reviewed.  Recent Labs: 12/23/2021: ALT 24; BUN 24; Creatinine, Ser 1.38; Hemoglobin 14.1; Platelets 249; Potassium 4.5; Sodium 140; TSH 1.240   Recent Lipid Panel Lab Results  Component Value Date/Time   CHOL 161 12/23/2021 09:02 AM   CHOL 177 05/17/2017 01:11 PM   CHOL 131 05/25/2014 05:56 AM   TRIG 444 (H) 12/23/2021 09:02 AM   TRIG 361 (H) 05/17/2017 01:11 PM   TRIG 167 05/25/2014 05:56 AM   HDL 37 (L) 12/23/2021 09:02 AM   HDL 30 (L) 05/25/2014 05:56 AM   CHOLHDL 4.3 08/08/2019 08:41 AM   LDLCALC 57 12/23/2021 09:02 AM   LDLCALC 68 05/25/2014 05:56 AM   LDLDIRECT 96.4 06/04/2019 07:12 AM    Wt Readings from Last 3 Encounters:  02/17/22 (!) 312 lb (141.5 kg)  12/23/21 (!) 321 lb 12.8 oz (146 kg)  12/14/21 (!) 320 lb (145.2 kg)     Risk Assessment/Calculations:          Objective:    Vital Signs:  BP 101/78   Pulse 94   Ht '5\' 6"'$  (1.676 m)    Wt (!) 312 lb (141.5 kg)   SpO2 96%   BMI 50.36 kg/m    VITAL  SIGNS:  reviewed  ASSESSMENT & PLAN:    Chronic HFrEF: Mr. Plush is feeling well with NYHA class I-II symptoms (functional assessment limited by decreased mobility from his knee problems).  He continues to lose weight, which is a positive sign.  His chronic borderline low blood pressure precludes escalation of GDMT again today.  We will therefore continue his current regimen of dapagliflozin, lisinopril, metoprolol, and spironolactone.  Renal function and potassium were stable on last check in June.  We will plan to repeat labs when he follows up with [place] in 3 months.  We will also obtain an echocardiogram to reassess his LVEF shortly before our next visit (EF was 40-45% on last check in 01/2019).  Nonobstructive coronary artery disease: Moderate single-vessel CAD noted by cath in 01/2019.  No angina reported.  Continue secondary prevention including high intensity statin therapy.  Most recent lipid panel in June showed excellent LDL.  Triglycerides were elevated despite being on omega-3 and fenofibrate.  I encouraged continued lifestyle modifications to help with lipid control.  Hyperlipidemia associated with type 2 diabetes mellitus: Continue current regimen of rosuvastatin, fish oil, and fenofibrate.  Ongoing lifestyle modifications encouraged.  Continued management of DM per Dr. Wynetta Emery.  Thoracic aortic aneurysm: Ascending aortic aneurysm stable on CT in June.  Continue close surveillance with Dr. Cyndia Bent.  History of mechanical AVR: No symptoms to suggest further cardioembolic events.  Continue aspirin and warfarin (managed through Dr. Durenda Age office).  No bleeding reported.  Morbid obesity: BMI remains greater than 50 though Mr. Merri Ray is losing weight.  I congratulated him on this and encouraged him to continue with weight loss.     Time:   Today, I have spent 15 minutes with the patient with telehealth technology  discussing the above problems.     Medication Adjustments/Labs and Tests Ordered: Current medicines are reviewed at length with the patient today.  Concerns regarding medicines are outlined above.   Tests Ordered: Orders Placed This Encounter  Procedures   ECHOCARDIOGRAM COMPLETE    Medication Changes: None  Follow Up:  In Person  in 3 months with echo shortly before visit.  Signed, Nelva Bush, MD  02/17/2022 9:32 AM    [place]

## 2022-02-17 NOTE — Telephone Encounter (Signed)
  Patient Consent for Virtual Visit   Roberto [name] has provided verbal consent on 02/17/2022 for a virtual visit (video or telephone).   CONSENT FOR VIRTUAL VISIT FOR:  Roberto [name]  By participating in this virtual visit I agree to the following:  I hereby voluntarily request, consent and authorize [facility] and its employed or contracted physicians, physician assistants, nurse practitioners or other licensed health care professionals (the Practitioner), to provide me with telemedicine health care services (the "Services") as deemed necessary by the treating Practitioner. I acknowledge and consent to receive the Services by the Practitioner via telemedicine. I understand that the telemedicine visit will involve communicating with the Practitioner through live audiovisual communication technology and the disclosure of certain medical information by electronic transmission. I acknowledge that I have been given the opportunity to request an in-person assessment or other available alternative prior to the telemedicine visit and am voluntarily participating in the telemedicine visit.  I understand that I have the right to withhold or withdraw my consent to the use of telemedicine in the course of my care at any time, without affecting my right to future care or treatment, and that the Practitioner or I may terminate the telemedicine visit at any time. I understand that I have the right to inspect all information obtained and/or recorded in the course of the telemedicine visit and may receive copies of available information for a reasonable fee.  I understand that some of the potential risks of receiving the Services via telemedicine include:  Delay or interruption in medical evaluation due to technological equipment failure or disruption; Information transmitted may not be sufficient (e.g. poor resolution of images) to allow for appropriate medical decision making by the Practitioner; and/or  In  rare instances, security protocols could fail, causing a breach of personal health information.  Furthermore, I acknowledge that it is my responsibility to provide information about my medical history, conditions and care that is complete and accurate to the best of my ability. I acknowledge that Practitioner's advice, recommendations, and/or decision may be based on factors not within their control, such as incomplete or inaccurate data provided by me or distortions of diagnostic images or specimens that may result from electronic transmissions. I understand that the practice of medicine is not an exact science and that Practitioner makes no warranties or guarantees regarding treatment outcomes. I acknowledge that a copy of this consent can be made available to me via my patient portal ([place]), or I can request a printed copy by calling the office of [facility].    I understand that my insurance will be billed for this visit.   I have read or had this consent read to me. I understand the contents of this consent, which adequately explains the benefits and risks of the Services being provided via telemedicine.  I have been provided ample opportunity to ask questions regarding this consent and the Services and have had my questions answered to my satisfaction. I give my informed consent for the services to be provided through the use of telemedicine in my medical care

## 2022-02-20 ENCOUNTER — Other Ambulatory Visit: Payer: Self-pay

## 2022-02-20 NOTE — Telephone Encounter (Signed)
Received fax from [facility] stating Normal manufacturer of Warfarin on backorder. OK to change to different manufacturer? Need doctor okay. Thanks.

## 2022-02-20 NOTE — Telephone Encounter (Signed)
OK for different manufacturer

## 2022-02-20 NOTE — Telephone Encounter (Signed)
[facility] pharmacist ok to change.

## 2022-02-24 ENCOUNTER — Ambulatory Visit (INDEPENDENT_AMBULATORY_CARE_PROVIDER_SITE_OTHER): Payer: Medicare Other | Admitting: Family Medicine

## 2022-02-24 DIAGNOSIS — Z952 Presence of prosthetic heart valve: Secondary | ICD-10-CM | POA: Diagnosis not present

## 2022-02-26 ENCOUNTER — Other Ambulatory Visit: Payer: Self-pay | Admitting: Family Medicine

## 2022-02-27 NOTE — Telephone Encounter (Signed)
Vangie Bicker called reporting that the current pen they have for the patient is not working, he takes his injections every Monday. They are requesting to have this sent to the pharmacy asap

## 2022-02-27 NOTE — Telephone Encounter (Signed)
Requested Prescriptions  Pending Prescriptions Disp Refills  . OZEMPIC, 0.25 OR 0.5 MG/DOSE, 2 MG/3ML SOPN [Pharmacy Med Name: Ozempic (0.25 or 0.5 MG/DOSE) 2 MG/3ML Subcutaneous Solution Pen-injector] 3 mL 0    Sig: INJECT 0.'25MG'$  SUBCUTANEOUSLY ONCE A WEEK FOR 28 DAYS, THEN INCREASE TO 0.'5MG'$  ONCE A WEEK     Endocrinology:  Diabetes - GLP-1 Receptor Agonists - semaglutide Failed - 02/27/2022 10:24 AM      Failed - HBA1C in normal range and within 180 days    Hemoglobin A1C  Date Value Ref Range Status  04/19/2016 6.6  Final   HB A1C (BAYER DCA - WAIVED)  Date Value Ref Range Status  12/23/2021 6.3 (H) 4.8 - 5.6 % Final    Comment:             Prediabetes: 5.7 - 6.4          Diabetes: >6.4          Glycemic control for adults with diabetes: <7.0          Failed - Cr in normal range and within 360 days    Creatinine  Date Value Ref Range Status  05/25/2014 0.85 0.60 - 1.30 mg/dL Final   Creatinine, Ser  Date Value Ref Range Status  12/23/2021 1.38 (H) 0.76 - 1.27 mg/dL Final         Passed - Valid encounter within last 6 months    Recent Outpatient Visits          2 months ago Hypertension associated with diabetes ([place])   [place], [name], DO   9 months ago Diabetes mellitus type 2, diet-controlled ([place])   [place], Chance T, NP   1 year ago Diabetes mellitus type 2, diet-controlled ([place])   [place], [name], DO   1 year ago Diabetes mellitus type 2, diet-controlled ([place])   [place], [place], DO   2 years ago Essential hypertension   [place], [place], DO      Future Appointments            In 1 month [name], Barb Merino, DO [employer], [place]   In 1 month End, Harrell Gave, MD [place]. [place]

## 2022-03-06 LAB — PROTIME-INR: INR: 3.2 — AB (ref 0.80–1.20)

## 2022-03-10 ENCOUNTER — Ambulatory Visit (INDEPENDENT_AMBULATORY_CARE_PROVIDER_SITE_OTHER): Payer: Medicare Other | Admitting: Family Medicine

## 2022-03-10 DIAGNOSIS — Z952 Presence of prosthetic heart valve: Secondary | ICD-10-CM | POA: Diagnosis not present

## 2022-03-14 DIAGNOSIS — Z952 Presence of prosthetic heart valve: Secondary | ICD-10-CM | POA: Diagnosis not present

## 2022-03-14 DIAGNOSIS — Z7901 Long term (current) use of anticoagulants: Secondary | ICD-10-CM | POA: Diagnosis not present

## 2022-03-15 ENCOUNTER — Telehealth: Payer: Self-pay | Admitting: Family Medicine

## 2022-03-15 NOTE — Telephone Encounter (Signed)
Copied from [place] [id number]. Topic: General - Other >> Mar 15, 2022 12:01 PM Everette C wrote: Reason for CRM: The patient's friend [name] has called to request a prescription for a bed for the patient   Please contact the patient's friend to discuss further when possible   The patient's friend has no preference of medical supply companies

## 2022-03-16 ENCOUNTER — Telehealth: Payer: Self-pay | Admitting: Internal Medicine

## 2022-03-16 NOTE — Telephone Encounter (Signed)
Spoke with patients friend [name] per [employer] on file and explained that we do not order beds from Cardiology. I also informed her that I see a note in the chart that looks like his PCP's office is working on this. I will route this encounter to them as an FYI that patients friend is reaching out again.  [name] was grateful for the call back.

## 2022-03-16 NOTE — Telephone Encounter (Signed)
New Message:      [name] called and said patient needs Dr End to write an order for am electric hospital bed. Patient is unable to sleep in a regular bed. Please fax the order to [phone].

## 2022-03-17 NOTE — Telephone Encounter (Signed)
Caller checking status of rx for hospital bed  Pt states [facility] has a bed and waiting on the rx  Please assist further

## 2022-03-18 ENCOUNTER — Other Ambulatory Visit: Payer: Self-pay | Admitting: Family Medicine

## 2022-03-18 ENCOUNTER — Other Ambulatory Visit: Payer: Self-pay | Admitting: Internal Medicine

## 2022-03-20 NOTE — Telephone Encounter (Signed)
/  Please advise, will patient need appointment first? Has upcoming appointment on 03/31/22

## 2022-03-20 NOTE — Telephone Encounter (Signed)
Request for Albuterol MDI refill- attempted to call pharmacy but no answer. Last RF 12/23/21 54 grams with 6 refills. Too soon for refill.     Requested Prescriptions  Pending Prescriptions Disp Refills   VENTOLIN HFA 108 (90 Base) MCG/ACT inhaler [Pharmacy Med Name: Ventolin HFA 108 (90 Base) MCG/ACT Inhalation Aerosol Solution] 54 g 0    Sig: INHALE 2 PUFFS BY MOUTH EVERY 6 HOURS AS NEEDED FOR WHEEZING OR SHORTNESS OF BREATH     Pulmonology:  Beta Agonists 2 Passed - 03/18/2022 12:44 PM      Passed - Last BP in normal range    BP Readings from Last 1 Encounters:  02/17/22 101/78         Passed - Last Heart Rate in normal range    Pulse Readings from Last 1 Encounters:  02/17/22 94         Passed - Valid encounter within last 12 months    Recent Outpatient Visits           2 months ago Hypertension associated with diabetes ([place])   [place], [name], DO   10 months ago Diabetes mellitus type 2, diet-controlled ([place])   [place], [place] T, NP   1 year ago Diabetes mellitus type 2, diet-controlled ([place])   [place], [name], DO   1 year ago Diabetes mellitus type 2, diet-controlled ([place])   [place], [place], DO   2 years ago Essential hypertension   [place], [place], DO       Future Appointments             In 1 week Wynetta Emery, Barb Merino, DO [employer], [place]   In 2 weeks End, Harrell Gave, MD [place]. [place]

## 2022-03-20 NOTE — Telephone Encounter (Signed)
Yes, we can do this 9/29

## 2022-03-20 NOTE — Telephone Encounter (Signed)
Just filled for 3 inhalers 03/05/22 Requested Prescriptions  Pending Prescriptions Disp Refills  . VENTOLIN HFA 108 (90 Base) MCG/ACT inhaler [Pharmacy Med Name: Ventolin HFA 108 (90 Base) MCG/ACT Inhalation Aerosol Solution] 54 g 0    Sig: INHALE 2 PUFFS BY MOUTH EVERY 6 HOURS AS NEEDED FOR WHEEZING OR SHORTNESS OF BREATH     Pulmonology:  Beta Agonists 2 Passed - 03/18/2022 12:44 PM      Passed - Last BP in normal range    BP Readings from Last 1 Encounters:  02/17/22 101/78         Passed - Last Heart Rate in normal range    Pulse Readings from Last 1 Encounters:  02/17/22 94         Passed - Valid encounter within last 12 months    Recent Outpatient Visits          2 months ago Hypertension associated with diabetes ([place])   [place], [name], DO   10 months ago Diabetes mellitus type 2, diet-controlled ([place])   [place], [place] T, NP   1 year ago Diabetes mellitus type 2, diet-controlled ([place])   [place], [name], DO   1 year ago Diabetes mellitus type 2, diet-controlled ([place])   [place], [place], DO   2 years ago Essential hypertension   [place], [place], DO      Future Appointments            In 1 week Wynetta Emery, Barb Merino, DO [employer], [place]   In 2 weeks End, Harrell Gave, MD [place]. [place]

## 2022-03-20 NOTE — Telephone Encounter (Signed)
Spoke with pharmacy, pt picked 3 inhalers up 03/05/22.

## 2022-03-20 NOTE — Telephone Encounter (Signed)
Returned call to [name] to notify order can be placed once patient is seen. Reminded [name] to be sure patient keeps appointment. Verbally understood.

## 2022-03-23 ENCOUNTER — Other Ambulatory Visit: Payer: Self-pay | Admitting: Family Medicine

## 2022-03-23 NOTE — Telephone Encounter (Signed)
Requested Prescriptions  Pending Prescriptions Disp Refills  . OZEMPIC, 0.25 OR 0.5 MG/DOSE, 2 MG/3ML SOPN [Pharmacy Med Name: Ozempic (0.25 or 0.5 MG/DOSE) 2 MG/3ML Subcutaneous Solution Pen-injector] 3 mL 0    Sig: INJECT 0.25 MG UNDER THE SKIN ONCE WEEKLY FOR 28 DAYS, THEN INCREASE TO 0.5 MG ONCE WEEKLY     Endocrinology:  Diabetes - GLP-1 Receptor Agonists - semaglutide Failed - 03/23/2022 10:42 AM      Failed - HBA1C in normal range and within 180 days    Hemoglobin A1C  Date Value Ref Range Status  04/19/2016 6.6  Final   HB A1C (BAYER DCA - WAIVED)  Date Value Ref Range Status  12/23/2021 6.3 (H) 4.8 - 5.6 % Final    Comment:             Prediabetes: 5.7 - 6.4          Diabetes: >6.4          Glycemic control for adults with diabetes: <7.0          Failed - Cr in normal range and within 360 days    Creatinine  Date Value Ref Range Status  05/25/2014 0.85 0.60 - 1.30 mg/dL Final   Creatinine, Ser  Date Value Ref Range Status  12/23/2021 1.38 (H) 0.76 - 1.27 mg/dL Final         Passed - Valid encounter within last 6 months    Recent Outpatient Visits          3 months ago Hypertension associated with diabetes ([place])   [place], [name], DO   10 months ago Diabetes mellitus type 2, diet-controlled ([place])   [place], [place]abro[place] T, NP   1 year ago Diabetes mellitus type 2, diet-controlled ([place])   [place], [name], DO   1 year ago Diabetes mellitus type 2, diet-controlled ([place])   [place], [place], DO   2 years ago Essential hypertension   [place], [place], DO      Future Appointments            In 1 week Wynetta Emery, Barb Merino, DO [place], [place]   In 2 weeks End, Harrell Gave, MD [facility] [place]. [place]

## 2022-03-30 ENCOUNTER — Other Ambulatory Visit: Payer: Medicare Other

## 2022-03-31 ENCOUNTER — Other Ambulatory Visit: Payer: Self-pay

## 2022-03-31 ENCOUNTER — Ambulatory Visit (INDEPENDENT_AMBULATORY_CARE_PROVIDER_SITE_OTHER): Payer: Medicare Other | Admitting: Family Medicine

## 2022-03-31 ENCOUNTER — Telehealth: Payer: Self-pay | Admitting: Internal Medicine

## 2022-03-31 ENCOUNTER — Ambulatory Visit: Payer: Self-pay

## 2022-03-31 ENCOUNTER — Encounter: Payer: Self-pay | Admitting: Family Medicine

## 2022-03-31 ENCOUNTER — Other Ambulatory Visit: Payer: Self-pay | Admitting: Family Medicine

## 2022-03-31 VITALS — BP 88/55 | HR 66 | Temp 98.4°F | Ht 65.98 in | Wt 313.4 lb

## 2022-03-31 DIAGNOSIS — I2510 Atherosclerotic heart disease of native coronary artery without angina pectoris: Secondary | ICD-10-CM

## 2022-03-31 DIAGNOSIS — I5022 Chronic systolic (congestive) heart failure: Secondary | ICD-10-CM

## 2022-03-31 DIAGNOSIS — I152 Hypertension secondary to endocrine disorders: Secondary | ICD-10-CM

## 2022-03-31 DIAGNOSIS — E1159 Type 2 diabetes mellitus with other circulatory complications: Secondary | ICD-10-CM

## 2022-03-31 DIAGNOSIS — J432 Centrilobular emphysema: Secondary | ICD-10-CM

## 2022-03-31 DIAGNOSIS — Z952 Presence of prosthetic heart valve: Secondary | ICD-10-CM | POA: Diagnosis not present

## 2022-03-31 LAB — BAYER DCA HB A1C WAIVED: HB A1C (BAYER DCA - WAIVED): 6.8 % — ABNORMAL HIGH (ref 4.8–5.6)

## 2022-03-31 LAB — COAGUCHEK XS/INR WAIVED
INR: 3.3 — ABNORMAL HIGH (ref 0.9–1.1)
Prothrombin Time: 39.3 s

## 2022-03-31 MED ORDER — ALBUTEROL SULFATE (2.5 MG/3ML) 0.083% IN NEBU: INHALATION_SOLUTION | RESPIRATORY_TRACT | 0 refills | Status: DC

## 2022-03-31 MED ORDER — SEMAGLUTIDE (1 MG/DOSE) 4 MG/3ML ~~LOC~~ SOPN: 1.0000 mg | PEN_INJECTOR | SUBCUTANEOUS | 6 refills | Status: DC

## 2022-03-31 NOTE — Assessment & Plan Note (Signed)
Doing well with INR of 3.3. Call with any concerns. Continue current regimen.

## 2022-03-31 NOTE — Telephone Encounter (Signed)
Summary: Medication Questions   Pt called with medication questions   Best contact: [phone]     Call went directly to voice mail - unable to [place] is full.

## 2022-03-31 NOTE — Telephone Encounter (Signed)
Summary: Medication Questions   Pt called with medication questions   Best contact: [phone]     Call went directly to VM - unable to [facility] mailbox is full.

## 2022-03-31 NOTE — Progress Notes (Addendum)
BP (!) 88/55   Pulse 66   Temp 98.4 F (36.9 C) (Oral)   Ht 5' 5.98" (1.676 m)   Wt (!) 313 lb 6.4 oz (142.2 kg)   SpO2 91%   BMI 50.61 kg/m    Subjective:    Patient ID: Roberto [name], male    DOB: 02-Nov-1955, 66 y.o.   MRN: [id number]  HPI: Roberto [name] is a 66 y.o. male  Chief Complaint  Patient presents with   Hospital Bed    Patient wants to get a hospital bed   Diabetes   Coagulation Disorder   HYPERTENSION Hypertension status: overtreated  Satisfied with current treatment? yes Duration of hypertension: chronic BP monitoring frequency:  rarely BP medication side effects:  no Medication compliance: excellent compliance Previous BP meds: spironalactone, lasix, metoprolol, lisinopril Aspirin: no Recurrent headaches: no Visual changes: no Palpitations: no Dyspnea: no Chest pain: no Lower extremity edema: no Dizzy/lightheaded: yes  DIABETES Hypoglycemic episodes:no Polydipsia/polyuria: no Visual disturbance: no Chest pain: no Paresthesias: no Glucose Monitoring: yes  Accucheck frequency:  few times a week Taking Insulin?: no Blood Pressure Monitoring: not checking Retinal Examination: Not up to Date Foot Exam: Up to Date Diabetic Education: Completed Pneumovax: Up to Date Influenza: Up to Date Aspirin: no  Coumadin Management.  The expected duration of coumadin treatment is lifelong The reason for anticoagulation is  mechanical heart valve.  Present Coumadin dose: 7.5 mg (7.5 mg x 1) every Tue, Fri; 4 mg (4 mg x 1) all other days Goal: 2.5-3.5 Excessive bruising: no Nose bleeding: no Rectal bleeding: no Prolonged menstrual cycles: N/A Eating diet with consistent amounts of foods containing Vitamin K:yes Any recent antibiotic use? no  Has had some falls and difficulty getting upstairs needs hospital bed. He has been having issues breathing at night and needs a hospital bed to raise up so he can breath better due to his COPD. He is otherwise  doing well.   Relevant past medical, surgical, family and social history reviewed and updated as indicated. Interim medical history since our last visit reviewed. Allergies and medications reviewed and updated.  Review of Systems  Constitutional: Negative.   Respiratory: Negative.    Cardiovascular: Negative.   Gastrointestinal: Negative.   Musculoskeletal:  Positive for back pain and myalgias. Negative for arthralgias, gait problem, joint swelling, neck pain and neck stiffness.  Skin: Negative.   Neurological: Negative.   Psychiatric/Behavioral: Negative.      Per HPI unless specifically indicated above     Objective:    BP (!) 88/55   Pulse 66   Temp 98.4 F (36.9 C) (Oral)   Ht 5' 5.98" (1.676 m)   Wt (!) 313 lb 6.4 oz (142.2 kg)   SpO2 91%   BMI 50.61 kg/m   Wt Readings from Last 3 Encounters:  05/02/22 (!) 315 lb 11.2 oz (143.2 kg)  03/31/22 (!) 313 lb 6.4 oz (142.2 kg)  02/17/22 (!) 312 lb (141.5 kg)    Physical Exam Vitals and nursing note reviewed.  Constitutional:      General: He is not in acute distress.    Appearance: Normal appearance. He is obese. He is not ill-appearing, toxic-appearing or diaphoretic.  HENT:     Head: Normocephalic and atraumatic.     Right Ear: External ear normal.     Left Ear: External ear normal.     Nose: Nose normal.     Mouth/Throat:     Mouth: Mucous membranes are moist.  Pharynx: Oropharynx is clear.  Eyes:     General: No scleral icterus.       Right eye: No discharge.        Left eye: No discharge.     Extraocular Movements: Extraocular movements intact.     Conjunctiva/sclera: Conjunctivae normal.     Pupils: Pupils are equal, round, and reactive to light.  Cardiovascular:     Rate and Rhythm: Normal rate and regular rhythm.     Pulses: Normal pulses.     Heart sounds: Normal heart sounds. No murmur heard.    No friction rub. No gallop.  Pulmonary:     Effort: Pulmonary effort is normal. No respiratory  distress.     Breath sounds: Normal breath sounds. No stridor. No wheezing, rhonchi or rales.  Chest:     Chest wall: No tenderness.  Musculoskeletal:        General: Normal range of motion.     Cervical back: Normal range of motion and neck supple.  Skin:    General: Skin is warm and dry.     Capillary Refill: Capillary refill takes less than 2 seconds.     Coloration: Skin is not jaundiced or pale.     Findings: No bruising, erythema, lesion or rash.  Neurological:     General: No focal deficit present.     Mental Status: He is alert and oriented to person, place, and time. Mental status is at baseline.  Psychiatric:        Mood and Affect: Mood normal.        Behavior: Behavior normal.        Thought Content: Thought content normal.        Judgment: Judgment normal.     Results for orders placed or performed in visit on 03/31/22  CoaguChek XS/INR Waived  Result Value Ref Range   INR 3.3 (H) 0.9 - 1.1   Prothrombin Time 39.3 sec  Bayer DCA Hb A1c Waived  Result Value Ref Range   HB A1C (BAYER DCA - WAIVED) 6.8 (H) 4.8 - 5.6 %      Assessment & Plan:   Problem List Items Addressed This Visit       Cardiovascular and Mediastinum   Hypertension associated with diabetes ([place])    Overtreated. BP in the 80s/50s with dizziness. Will stop spironalactone and recheck 1 month. Call with any concerns.       Relevant Medications   Semaglutide, 1 MG/DOSE, 4 MG/3ML SOPN   Type 2 diabetes mellitus with cardiac complication (HCC)    Doing well with A1c of 6.8. Will increase his ozempic to help with weight loss. Call with any concerns.      Relevant Medications   Semaglutide, 1 MG/DOSE, 4 MG/3ML SOPN   Other Relevant Orders   Bayer DCA Hb A1c Waived (Completed)   Chronic HFrEF (heart failure with reduced ejection fraction) ([place])    Needs hospital bed to elevate his head at night to help him breathe.         Respiratory   COPD (chronic obstructive pulmonary disease) ([place])     Needs hospital bed to elevate his head at night to help him breathe.         Other   H/O mechanical aortic valve replacement - Primary    Doing well with INR of 3.3. Call with any concerns. Continue current regimen.       Relevant Orders   CoaguChek XS/INR Waived (Completed)   Morbid  obesity ([place])    Down 8lbs since starting ozempic for sugars. Will increase to '1mg'$  and recheck sugars in 3 months. Needs hospital bed. Will order it.       Relevant Medications   Semaglutide, 1 MG/DOSE, 4 MG/3ML SOPN     Follow up plan: Return for 1 month with me, Monday for nurse visit for help with ozempic.

## 2022-03-31 NOTE — Assessment & Plan Note (Signed)
Overtreated. BP in the 80s/50s with dizziness. Will stop spironalactone and recheck 1 month. Call with any concerns.

## 2022-03-31 NOTE — Assessment & Plan Note (Signed)
Doing well with A1c of 6.8. Will increase his ozempic to help with weight loss. Call with any concerns.

## 2022-03-31 NOTE — Telephone Encounter (Signed)
Medication Refill - Medication: albuterol (PROVENTIL) (2.5 MG/3ML) 0.083% nebulizer solution  Has the patient contacted their pharmacy? Yes [facility] is calling to ask for new Rx w/ dx code attached for the medication. Preferred Pharmacy (with phone number or street name): [facility] [place] [facility] [place] Has the patient been seen for an appointment in the last year OR does the patient have an upcoming appointment? Yes.    Agent: Please be advised that RX refills may take up to 3 business days. We ask that you follow-up with your pharmacy.

## 2022-03-31 NOTE — Assessment & Plan Note (Addendum)
Down 8lbs since starting ozempic for sugars. Will increase to '1mg'$  and recheck sugars in 3 months. Needs hospital bed. Will order it.

## 2022-03-31 NOTE — Telephone Encounter (Signed)
Summary: Medication Questions   Pt called with medication questions   Best contact: [phone]      Call went straight to VM. Mailbox is full - unable to [facility].

## 2022-03-31 NOTE — Telephone Encounter (Signed)
Returned the call to the pt caretaker [name]. Adv her that nortriptyline is a non-cardiac medication and we would defer to the pt pcp regarding usage.  [name] sts that she may have provided the wrong medication name. [name] sts that the patient was seen by his pcp Dr. Wynetta Emery and she stopped a medication due to the pt being hypotensive. [name] just wants to make sure Dr. Saunders Revel is aware. [name] sts that she will call the pt pcpc office to verify the med change and call back to provide the update.

## 2022-03-31 NOTE — Telephone Encounter (Signed)
Pt c/o medication issue:  1. Name of Medication:   nortriptyline (PAMELOR) 10 MG capsule    2. How are you currently taking this medication (dosage and times per day)?  Take 1 capsule (10 mg total) by mouth at bedtime. 3. Are you having a reaction (difficulty breathing--STAT)? No  4. What is your medication issue? Caregiver states that pt saw PCP this morning and she took pt off of medication. Caregiver would like a second option from Dr. Saunders Revel regarding this matter and a callback as well. Please advise

## 2022-03-31 NOTE — Telephone Encounter (Signed)
Requested Prescriptions  Pending Prescriptions Disp Refills  . albuterol (PROVENTIL) (2.5 MG/3ML) 0.083% nebulizer solution 150 mL 0    Sig: USE 1 VIAL IN NEBULIZER EVERY 6 HOURS AS NEEDED FOR WHEEZING FOR SHORTNESS OF BREATH J44.9     Pulmonology:  Beta Agonists 2 Failed - 03/31/2022 12:07 PM      Failed - Last BP in normal range    BP Readings from Last 1 Encounters:  03/31/22 (!) 88/55         Passed - Last Heart Rate in normal range    Pulse Readings from Last 1 Encounters:  03/31/22 66         Passed - Valid encounter within last 12 months    Recent Outpatient Visits          Today H/O mechanical aortic valve replacement   [facility] [place], [name], DO   3 months ago Hypertension associated with diabetes [facility])   [place], [name], DO   10 months ago Diabetes mellitus type 2, diet-controlled ([place])   [place], [place] T, NP   1 year ago Diabetes mellitus type 2, diet-controlled ([place])   [place], [name], DO   1 year ago Diabetes mellitus type 2, diet-controlled ([place])   [place], [place], DO      Future Appointments            In 1 month [name], Barb Merino, DO [place], [place]   In 1 month End, Harrell Gave, MD [place]. [place]

## 2022-04-02 LAB — PROTIME-INR: INR: 3.2 — AB (ref 0.80–1.20)

## 2022-04-03 ENCOUNTER — Ambulatory Visit: Payer: Medicare Other

## 2022-04-03 ENCOUNTER — Ambulatory Visit: Payer: Self-pay | Admitting: Family Medicine

## 2022-04-03 ENCOUNTER — Telehealth: Payer: Self-pay | Admitting: Family Medicine

## 2022-04-03 ENCOUNTER — Other Ambulatory Visit: Payer: Self-pay | Admitting: Family Medicine

## 2022-04-03 DIAGNOSIS — Z952 Presence of prosthetic heart valve: Secondary | ICD-10-CM

## 2022-04-03 NOTE — Telephone Encounter (Signed)
Spoke with patient and reviewed Dr. Darnelle Bos recommendations. She was appreciative for his input and had no further questions at this time.

## 2022-04-03 NOTE — Telephone Encounter (Signed)
Patient says there is an issue with albuterol at the pharmacy- not sure what it is, can we check

## 2022-04-03 NOTE — Telephone Encounter (Signed)
I am okay with holding spironolactone, though it may not offer significant improvement in his blood pressure.  If symptomatic hypotension remains an issue, we may need to deescalate metoprolol in the future.  Nelva Bush, MD [facility]

## 2022-04-03 NOTE — Telephone Encounter (Signed)
No call back from the patient's caregiver- [name]. I have reviewed the patient's office visit note with his PCP Dr. Wynetta Emery from 03/31/22. Per Dr. Durenda Age note: Hypertension associated with diabetes ([place])      Overtreated. BP in the 80s/50s with dizziness. Will stop spironalactone and recheck 1 month. Call with any concerns     Forwarding to Dr. Saunders Revel as an FYI/ for any further recommendations.

## 2022-04-04 NOTE — Telephone Encounter (Signed)
Per pharmacy RX needed diagnosis code. Advised pharmacy medication rx was resent with diagnosis code. Will be able to be filled now.

## 2022-04-06 ENCOUNTER — Encounter: Payer: Self-pay | Admitting: Family Medicine

## 2022-04-07 ENCOUNTER — Ambulatory Visit: Payer: Medicare Other | Admitting: Internal Medicine

## 2022-04-12 LAB — PROTIME-INR: INR: 3.3 — AB (ref 0.80–1.20)

## 2022-04-13 ENCOUNTER — Telehealth: Payer: Self-pay | Admitting: Family Medicine

## 2022-04-13 ENCOUNTER — Ambulatory Visit: Payer: Self-pay | Admitting: Family Medicine

## 2022-04-13 DIAGNOSIS — Z952 Presence of prosthetic heart valve: Secondary | ICD-10-CM

## 2022-04-13 NOTE — Telephone Encounter (Signed)
Pt's friend called for status update on hospital bed request, she wants to speak to the clinic today. Says this was supposed to have been ordered following the patient's visit two weeks ago.   Best contact: [phone]

## 2022-04-13 NOTE — Telephone Encounter (Signed)
Hospital bed was ordered last visit.

## 2022-04-14 NOTE — Telephone Encounter (Signed)
Order for hospital bed was sent to [facility] but they do not do hospital beds. Will send new order to [employer] store in [place].

## 2022-04-16 ENCOUNTER — Other Ambulatory Visit: Payer: Self-pay | Admitting: Family Medicine

## 2022-04-17 NOTE — Telephone Encounter (Signed)
Requested medication (s) are due for refill today: yes  Requested medication (s) are on the active medication list:yes  Last refill:  03/02/22  Future visit scheduled: yes  Notes to clinic:  Unable to refill per protocol, Manual Review: If patient's warfarin is managed by Anti-Coag team, route request to them. If not, route request to the provider.     Requested Prescriptions  Pending Prescriptions Disp Refills   warfarin (COUMADIN) 4 MG tablet [Pharmacy Med Name: Warfarin Sodium 4 MG Oral Tablet] 60 tablet 0    Sig: TAKE 1 TABLET BY MOUTH ONCE DAILY AT  6PM  AS  DIRECTED     Hematology:  Anticoagulants - warfarin Failed - 04/16/2022  5:32 PM      Failed - Manual Review: If patient's warfarin is managed by Anti-Coag team, route request to them. If not, route request to the provider.      Failed - INR in normal range and within 30 days    INR  Date Value Ref Range Status  04/12/2022 3.30 (A) 0.80 - 1.20 Final  03/31/2022 3.3 (H) 0.9 - 1.1 Final  05/25/2014 1.9  Final    Comment:    INR reference interval applies to patients on anticoagulant therapy. A single INR therapeutic range for coumarins is not optimal for all indications; however, the suggested range for most indications is 2.0 - 3.0. Exceptions to the INR Reference Range may include: Prosthetic heart valves, acute myocardial infarction, prevention of myocardial infarction, and combinations of aspirin and anticoagulant. The need for a higher or lower target INR must be assessed individually. Reference: The Pharmacology and Management of the Vitamin K  antagonists: the seventh ACCP Conference on Antithrombotic and Thrombolytic Therapy. UYQIH.4742 Sept:126 (3suppl): [id number]. A HCT value >55% may artifactually increase the PT.  In one study,  the increase was an average of 25%. Reference:  "Effect on Routine and Special Coagulation Testing Values of Citrate Anticoagulant Adjustment in Patients with High HCT  Values." American Journal of Clinical Pathology 2006;126:400-405.          Passed - HCT in normal range and within 360 days    Hematocrit  Date Value Ref Range Status  12/23/2021 42.9 37.5 - 51.0 % Final         Passed - Patient is not pregnant      Passed - Valid encounter within last 3 months    Recent Outpatient Visits           2 weeks ago H/O mechanical aortic valve replacement   [facility] [place], [name], DO   3 months ago Hypertension associated with diabetes [facility])   [place], [name], DO   11 months ago Diabetes mellitus type 2, diet-controlled ([place])   [place], [place] T, NP   1 year ago Diabetes mellitus type 2, diet-controlled ([place])   [place], [name], DO   1 year ago Diabetes mellitus type 2, diet-controlled ([place])   [place], [place], DO       Future Appointments             In 2 weeks Wynetta Emery, Barb Merino, DO [place], [place]   In 3 weeks End, Harrell Gave, MD [place]. [employer]

## 2022-04-18 NOTE — Telephone Encounter (Signed)
[place] calling b/c they do not have pt's demographics or any contact information.  Can you fax to [phone]  Cb [phone]

## 2022-04-18 NOTE — Telephone Encounter (Signed)
Demographics faxed.  

## 2022-04-20 ENCOUNTER — Ambulatory Visit (INDEPENDENT_AMBULATORY_CARE_PROVIDER_SITE_OTHER): Payer: Medicare Other | Admitting: Family Medicine

## 2022-04-20 DIAGNOSIS — Z952 Presence of prosthetic heart valve: Secondary | ICD-10-CM | POA: Diagnosis not present

## 2022-04-20 DIAGNOSIS — Z7901 Long term (current) use of anticoagulants: Secondary | ICD-10-CM | POA: Diagnosis not present

## 2022-04-20 LAB — PROTIME-INR
INR: 3.2 — AB (ref 0.80–1.20)
INR: 3.2 — AB (ref <=1.20)
INR: 3.2 — AB (ref <=1.20)

## 2022-04-28 ENCOUNTER — Ambulatory Visit: Payer: Medicare Other | Attending: Internal Medicine

## 2022-04-28 DIAGNOSIS — Z952 Presence of prosthetic heart valve: Secondary | ICD-10-CM | POA: Diagnosis not present

## 2022-04-28 DIAGNOSIS — I5022 Chronic systolic (congestive) heart failure: Secondary | ICD-10-CM

## 2022-04-28 LAB — ECHOCARDIOGRAM COMPLETE
AR max vel: 2.18 cm2
AV Area VTI: 2.41 cm2
AV Area mean vel: 1.91 cm2
AV Mean grad: 12.7 mmHg
AV Peak grad: 20.7 mmHg
Ao pk vel: 2.28 m/s
Area-P 1/2: 5.58 cm2
S' Lateral: 4.4 cm

## 2022-04-28 MED ORDER — PERFLUTREN LIPID MICROSPHERE
1.0000 mL | INTRAVENOUS | Status: AC | PRN
  Administered 2022-04-28: 2 mL via INTRAVENOUS

## 2022-05-02 ENCOUNTER — Ambulatory Visit (INDEPENDENT_AMBULATORY_CARE_PROVIDER_SITE_OTHER): Payer: Medicare Other | Admitting: Family Medicine

## 2022-05-02 ENCOUNTER — Encounter: Payer: Self-pay | Admitting: Family Medicine

## 2022-05-02 VITALS — BP 109/73 | HR 70 | Temp 97.9°F | Wt 315.7 lb

## 2022-05-02 DIAGNOSIS — I2510 Atherosclerotic heart disease of native coronary artery without angina pectoris: Secondary | ICD-10-CM

## 2022-05-02 DIAGNOSIS — I152 Hypertension secondary to endocrine disorders: Secondary | ICD-10-CM | POA: Diagnosis not present

## 2022-05-02 DIAGNOSIS — E1159 Type 2 diabetes mellitus with other circulatory complications: Secondary | ICD-10-CM | POA: Diagnosis not present

## 2022-05-02 LAB — PROTIME-INR: INR: 3 — AB (ref 0.80–1.20)

## 2022-05-02 NOTE — Assessment & Plan Note (Signed)
Doing much better off spironalactone. No swelling. Continue current regimen. Continue to monitor. BMP checked today. Call with any concerns.

## 2022-05-02 NOTE — Progress Notes (Signed)
BP 109/73   Pulse 70   Temp 97.9 F (36.6 C)   Wt (!) 315 lb 11.2 oz (143.2 kg)   SpO2 95%   BMI 50.98 kg/m    Subjective:    Patient ID: Roberto [name], male    DOB: [birth year], 66 y.o.   MRN: [id number]  HPI: Roberto [name] is a 66 y.o. male  Chief Complaint  Patient presents with   Hypertension    Patient here to follow up, spironolactone stopped at last visit.   HYPERTENSION  Hypertension status: better  Satisfied with current treatment? yes Duration of hypertension: chronic BP monitoring frequency:  not checking BP range:  BP medication side effects:  no Medication compliance: excellent compliance Previous BP meds: lisinopril, lasix, metoprolol Aspirin: no Recurrent headaches: no Visual changes: no Palpitations: no Dyspnea: no Chest pain: no Lower extremity edema: no Dizzy/lightheaded: no   Relevant past medical, surgical, family and social history reviewed and updated as indicated. Interim medical history since our last visit reviewed. Allergies and medications reviewed and updated.  Review of Systems  Constitutional: Negative.   Respiratory: Negative.    Cardiovascular: Negative.   Musculoskeletal: Negative.   Neurological: Negative.   Psychiatric/Behavioral: Negative.      Per HPI unless specifically indicated above     Objective:    BP 109/73   Pulse 70   Temp 97.9 F (36.6 C)   Wt (!) 315 lb 11.2 oz (143.2 kg)   SpO2 95%   BMI 50.98 kg/m   Wt Readings from Last 3 Encounters:  05/02/22 (!) 315 lb 11.2 oz (143.2 kg)  03/31/22 (!) 313 lb 6.4 oz (142.2 kg)  02/17/22 (!) 312 lb (141.5 kg)    Physical Exam Vitals and nursing note reviewed.  Constitutional:      General: He is not in acute distress.    Appearance: Normal appearance. He is obese. He is not ill-appearing, toxic-appearing or diaphoretic.  HENT:     Head: Normocephalic and atraumatic.     Right Ear: External ear normal.     Left Ear: External ear normal.     Nose: Nose  normal.     Mouth/Throat:     Mouth: Mucous membranes are moist.     Pharynx: Oropharynx is clear.  Eyes:     General: No scleral icterus.       Right eye: No discharge.        Left eye: No discharge.     Extraocular Movements: Extraocular movements intact.     Conjunctiva/sclera: Conjunctivae normal.     Pupils: Pupils are equal, round, and reactive to light.  Cardiovascular:     Rate and Rhythm: Normal rate and regular rhythm.     Pulses: Normal pulses.     Heart sounds: Murmur heard.     No friction rub. No gallop.  Pulmonary:     Effort: Pulmonary effort is normal. No respiratory distress.     Breath sounds: Normal breath sounds. No stridor. No wheezing, rhonchi or rales.  Chest:     Chest wall: No tenderness.  Musculoskeletal:        General: Normal range of motion.     Cervical back: Normal range of motion and neck supple.  Skin:    General: Skin is warm and dry.     Capillary Refill: Capillary refill takes less than 2 seconds.     Coloration: Skin is not jaundiced or pale.     Findings: No bruising, erythema, lesion or  rash.  Neurological:     General: No focal deficit present.     Mental Status: He is alert and oriented to person, place, and time. Mental status is at baseline.  Psychiatric:        Mood and Affect: Mood normal.        Behavior: Behavior normal.        Thought Content: Thought content normal.        Judgment: Judgment normal.     Results for orders placed or performed in visit on 04/28/22  ECHOCARDIOGRAM COMPLETE  Result Value Ref Range   AR max vel 2.18 cm2   AV Peak grad 20.7 mmHg   Ao pk vel 2.28 m/s   S' Lateral 4.40 cm   Area-P 1/2 5.58 cm2   AV Area VTI 2.41 cm2   AV Mean grad 12.7 mmHg   AV Area mean vel 1.91 cm2      Assessment & Plan:   Problem List Items Addressed This Visit       Cardiovascular and Mediastinum   Hypertension associated with diabetes ([place]) - Primary    Doing much better off spironalactone. No swelling.  Continue current regimen. Continue to monitor. BMP checked today. Call with any concerns.       Relevant Orders   Basic metabolic panel     Follow up plan: Return in about 5 months (around 10/01/2022).

## 2022-05-03 ENCOUNTER — Telehealth: Payer: Self-pay | Admitting: Family Medicine

## 2022-05-03 DIAGNOSIS — G8929 Other chronic pain: Secondary | ICD-10-CM

## 2022-05-03 LAB — BASIC METABOLIC PANEL WITH GFR
BUN/Creatinine Ratio: 24 (ref 10–24)
BUN: 33 mg/dL — ABNORMAL HIGH (ref 8–27)
CO2: 22 mmol/L (ref 20–29)
Calcium: 10.4 mg/dL — ABNORMAL HIGH (ref 8.6–10.2)
Chloride: 101 mmol/L (ref 96–106)
Creatinine, Ser: 1.37 mg/dL — ABNORMAL HIGH (ref 0.76–1.27)
Glucose: 102 mg/dL — ABNORMAL HIGH (ref 70–99)
Potassium: 4.5 mmol/L (ref 3.5–5.2)
Sodium: 141 mmol/L (ref 134–144)
eGFR: 57 mL/min/1.73 — ABNORMAL LOW (ref >59)

## 2022-05-03 NOTE — Telephone Encounter (Signed)
Copied from [place] [phone]. Topic: General - Other >> May 03, 2022 11:16 AM Chapman Fitch wrote: Reason for CRM: Pt has a treadmill at the house and pt needs someone with him when he does Cardio / [name] would like to speak with nurse or Dr. Wynetta Emery about Physical therapy / she thinks he needs someone with him/ please advise They also inquired about the bed that was ordered for the pt / they havent heard back about that

## 2022-05-04 ENCOUNTER — Ambulatory Visit (INDEPENDENT_AMBULATORY_CARE_PROVIDER_SITE_OTHER): Payer: Medicare Other | Admitting: Family Medicine

## 2022-05-04 ENCOUNTER — Telehealth (INDEPENDENT_AMBULATORY_CARE_PROVIDER_SITE_OTHER): Payer: Medicare Other | Admitting: Physician Assistant

## 2022-05-04 VITALS — BP 91/57 | HR 78 | Temp 96.8°F

## 2022-05-04 DIAGNOSIS — Z952 Presence of prosthetic heart valve: Secondary | ICD-10-CM

## 2022-05-04 DIAGNOSIS — J069 Acute upper respiratory infection, unspecified: Secondary | ICD-10-CM

## 2022-05-04 MED ORDER — PREDNISONE 20 MG PO TABS: 40.0000 mg | ORAL_TABLET | Freq: Every day | ORAL | 0 refills | Status: AC

## 2022-05-04 MED ORDER — AZITHROMYCIN 250 MG PO TABS: ORAL_TABLET | ORAL | 0 refills | Status: DC

## 2022-05-04 NOTE — Patient Instructions (Addendum)
  Please start the Prednisone tomorrow morning. This can cause sleeplessness, increased blood glucose levels, and irritability. Please be mindful of this while taking   You can start the [place] and continue your over the counter medications as desired.       Place upper respiratory infection patient instructions here.

## 2022-05-04 NOTE — Telephone Encounter (Signed)
Please advise on PT. 

## 2022-05-04 NOTE — Progress Notes (Signed)
Virtual Visit via Video Note  I connected with Roberto [name] on 05/05/22 at  3:20 PM EDT by a video enabled telemedicine application and verified that I am speaking with the correct person using two identifiers.  Today's Provider: Talitha Givens, MHS, [name] Introduced myself to the patient as a [name] and provided education on APPs in clinical practice.   Location: Patient:  at home  Provider: [place], [place]   I discussed the limitations of evaluation and management by telemedicine and the availability of in person appointments. The patient expressed understanding and agreed to proceed.  Chief Complaint  Patient presents with   URI    Pt's girlfriend states that the patient has been having fatigue, headache, sinus pressure, congestion, cough, and body aches since Tuesday evening. Has been taking Coricidin that does not seem to help.     History of Present Illness:   Reports he has not been feeling well   URI -type symptoms Onset: sudden  Duration: Tuesday evening  Fever: no  Sore throat: scratchy  Nausea: no Vomiting: no Diarrhea: no  Chest pain: no  SOB: Mild but thinks this is from congestion  Reports intermittent dry cough Headaches: yes  Dizziness: no Ear pain: no Reports sinus congestion  Reports ongoing dry mouth but this was going on before the cold  Alleviating: nothing  Aggravating: getting up and moving around  Intervention: he has been taking Coricidin chest and cold- not helping much  COVID testing at home: has not tested himself for COVID   Result: NA  He has been using his Albuterol nebulizer at least once per day  Using Albuterol inhaler a few times per day  He has been using trellegy inhaler instead of Breztri - does not think the [place] was beneficial  States he has had a zpack in the past and this seems to be the most beneficial      Review of Systems  Constitutional:  Positive for malaise/fatigue. Negative for chills  and fever.  HENT:  Positive for congestion and sore throat. Negative for ear pain and sinus pain.   Respiratory:  Positive for cough. Negative for shortness of breath and wheezing.   Cardiovascular:  Negative for chest pain.  Gastrointestinal:  Negative for diarrhea, nausea and vomiting.  Musculoskeletal:  Positive for myalgias.  Neurological:  Positive for headaches. Negative for dizziness.     Observations/Objective:   Due to the nature of the virtual visit, physical exam and observations are limited. Able to obtain the following observations:   Alert, oriented, Appears comfortable, in no acute distress.  No scleral injection, no appreciated hoarseness, tachypnea, wheeze or strider. Able to maintain conversation without visible strain.  No cough appreciated during visit.     Assessment and Plan: Problem List Items Addressed This Visit   None Visit Diagnoses     Upper respiratory tract infection, unspecified type    -  Primary  Acute, new concern Visit with patient indicates symptoms comprised of dry cough, nasal congestion, scratchy throat, fatigue  since Tuesday congruent with acute URI that is likely viral in nature  He has not tested for COVID at home  Due to nature and duration of symptoms recommended treatment regimen is symptomatic relief and follow up if needed Will provide Prednisone burst today as he is having to use rescue inhaler more frequently Will provide zpack per patient request to provide further pulmonary relief and abx coverage Discussed with patient the various viral  and bacterial etiologies of current illness and appropriate course of treatment Discussed OTC medication options for multisymptom relief such as Dayquil/Nyquil, Theraflu, AlkaSeltzer, etc. Discussed return precautions if symptoms are not improving or worsen over next 5-7 days.     Relevant Medications   azithromycin (ZITHROMAX) 250 MG tablet   predniSONE (DELTASONE) 20 MG tablet       Follow Up Instructions:    I discussed the assessment and treatment plan with the patient. The patient was provided an opportunity to ask questions and all were answered. The patient agreed with the plan and demonstrated an understanding of the instructions.   The patient was advised to call back or seek an in-person evaluation if the symptoms worsen or if the condition fails to improve as anticipated.  I provided 15 minutes of non-face-to-face time during this encounter.  Return if symptoms worsen or fail to improve.   I, Benjamin Merrihew E Alphus Zeck, [name], have reviewed all documentation for this visit. The documentation on 05/05/22 for the exam, diagnosis, procedures, and orders are all accurate and complete.   Talitha Givens, MHS, [name] [facility]

## 2022-05-04 NOTE — Telephone Encounter (Signed)
We cannot order someone to come into the house while he's on the treadmill. I can order PT where he goes and works with them outside the home, but since he is not homebound he doesn't qualify for home health.

## 2022-05-05 ENCOUNTER — Ambulatory Visit: Payer: Self-pay

## 2022-05-05 DIAGNOSIS — U071 COVID-19: Secondary | ICD-10-CM

## 2022-05-05 MED ORDER — MOLNUPIRAVIR EUA 200MG CAPSULE: 4.0000 | ORAL_CAPSULE | Freq: Two times a day (BID) | ORAL | 0 refills | Status: AC

## 2022-05-05 NOTE — Telephone Encounter (Signed)
Patient would like antiviral sent in to pharmacy

## 2022-05-05 NOTE — Telephone Encounter (Signed)
  Chief Complaint: COVID + Symptoms: mild Frequency: saw Erin yesterday tested last night and this morning both times positive Pertinent Negatives: Patient denies many symptoms and all mild. Pt seen in office yesterday Disposition: _0 ED /_1 Urgent Care (no appt availability in office) / _2 Appointment(In office/virtual)/ _3  [place] [facility]/ _4 Home Care/ _5 Refused Recommended Disposition /_6 [facility]/ _7  Follow-up with PCP Additional Notes: Saw Erin yesterday and was told to call back if positive. Pt positive and is not taking prednisone because heard you were not supposed to. He does take 81 mg asa daily. Pt would like antiviral sent in to [facility].  Reason for Disposition  [1] COVID-19 diagnosed by positive lab test (e.g., PCR, rapid self-test kit) AND [2] mild symptoms (e.g., cough, fever, others) AND [3] no complications or SOB  Answer Assessment - Initial Assessment Questions 1. COVID-19 DIAGNOSIS: "How do you know that you have COVID?" (e.g., positive lab test or self-test, diagnosed by doctor or NP/PA, symptoms after exposure).     Home test positive twice 2. COVID-19 EXPOSURE: "Was there any known exposure to COVID before the symptoms began?" CDC Definition of close contact: within 6 feet (2 meters) for a total of 15 minutes or more over a 24-hour period.      no 3. ONSET: "When did the COVID-19 symptoms start?"      Started 3 days 4. WORST SYMPTOM: "What is your worst symptom?" (e.g., cough, fever, shortness of breath, muscle aches)     Aches and drowsiness 5. COUGH: "Do you have a cough?" If Yes, ask: "How bad is the cough?"       A little cough 6. FEVER: "Do you have a fever?" If Yes, ask: "What is your temperature, how was it measured, and when did it start?"     Low grade 7. RESPIRATORY STATUS: "Describe your breathing?" (e.g., normal; shortness of breath, wheezing, unable to speak)      no  11. VACCINE: "Have you had the COVID-19 vaccine?" If Yes, ask:  "Which one, how many shots, when did you get it?"       yes  Protocols used: Coronavirus (COVID-19) Diagnosed or Suspected-A-AH

## 2022-05-05 NOTE — Telephone Encounter (Signed)
Roberto [name] states its okay for patient to go to PT out of the home.  [name] information for [facility] to follow up on bed.

## 2022-05-05 NOTE — Telephone Encounter (Signed)
Patient caregiver, [name] is aware of provider advise. No further questions.

## 2022-05-05 NOTE — Telephone Encounter (Signed)
ummary: Covid +   Patient had MyChart visit with Erin Mecum yesterday for body aches and head pressure. Patient was told to call office it he took a home covid test and it was positive. Patient took covid test this morning and it was positive.      Patient's friend , [name] on [place], called and after transfer from agent no one on line. Called patient's  friend back and no answer, LVMTCB [phone]. Unable to review covid precautions. See above note patient friend calling back to report patient tested positive this am for covid.

## 2022-05-05 NOTE — Telephone Encounter (Signed)
Summary: Covid +   Patient had MyChart visit with Erin Mecum yesterday for body aches and head pressure. Patient was told to call office it he took a home covid test and it was positive. Patient took covid test this morning and it was positive.      Called pt - LMOMTCB

## 2022-05-05 NOTE — Telephone Encounter (Signed)
Summary: Covid +   Patient had MyChart visit with Erin Mecum yesterday for body aches and head pressure. Patient was told to call office it he took a home covid test and it was positive. Patient took covid test this morning and it was positive.       Called pt - LMOMTCB

## 2022-05-10 ENCOUNTER — Ambulatory Visit: Payer: Self-pay | Admitting: *Deleted

## 2022-05-10 NOTE — Telephone Encounter (Signed)
  Chief Complaint: Need qualifier in order to get a hospital bed for this pt.   Call Lorriane Shire at [phone] with [facility]. Symptoms: N/A Frequency: N/A Pertinent Negatives: Patient denies N/A Disposition: '[]'$ ED /'[]'$ Urgent Care (no appt availability in office) / '[]'$ Appointment(In office/virtual)/ '[]'$  [facility]/ '[]'$ Home Care/ '[]'$ Refused Recommended Disposition /'[]'$ [facility] [facility]/ '[x]'$  Follow-up with PCP Additional Notes: Message sent to Dr. Park Liter at [facility].

## 2022-05-10 NOTE — Telephone Encounter (Signed)
We have an order for a hospital bed.  I need a ICD 10 for the hospital bed.  It needs to be for respiratory or for pain in order to qualify for a hospital bed.   He needs to be elevated at 90 degrees to sleep is a qualifier.  Reason for Disposition  [1] Caller requesting NON-URGENT health information AND [2] PCP's office is the best resource  Answer Assessment - Initial Assessment Questions 1. REASON FOR CALL or QUESTION: "What is your reason for calling today?" or "How can I best help you?" or "What question do you have that I can help answer?"     See documentation note from Lorriane Shire from [facility] regarding qualification for a hospital bed for this pt.   They got an order but pt does not meet criteria for bed based on Dr. Durenda Age note.  Protocols used: Information Only Call - No Triage-A-AH

## 2022-05-11 LAB — PROTIME-INR: INR: 2.9 — AB (ref 0.80–1.20)

## 2022-05-11 NOTE — Telephone Encounter (Signed)
Can we please find out what is currently on the order and I'll see what I can change?

## 2022-05-12 ENCOUNTER — Ambulatory Visit: Payer: Medicare Other | Admitting: Internal Medicine

## 2022-05-12 NOTE — Telephone Encounter (Signed)
Per Lorriane Shire at [facility] [employer] [facility], a ICD-10 code of COPD or Lower back pain would be required. Patient has both, will document on form. OV note adressing COPD is needed, last discussed in June. Lorriane Shire states in that note,  it needs to say patient requires of elevation at night to sleep, which is why hospital bed is needed.

## 2022-05-14 ENCOUNTER — Other Ambulatory Visit: Payer: Self-pay | Admitting: Family Medicine

## 2022-05-15 ENCOUNTER — Ambulatory Visit (INDEPENDENT_AMBULATORY_CARE_PROVIDER_SITE_OTHER): Payer: Medicare Other | Admitting: Family Medicine

## 2022-05-15 DIAGNOSIS — Z952 Presence of prosthetic heart valve: Secondary | ICD-10-CM

## 2022-05-15 NOTE — Telephone Encounter (Signed)
Requested medication (s) are due for refill today: yes  Requested medication (s) are on the active medication list: yes  Last refill:  01/23/22 #90 0 refills  Future visit scheduled: yes in 4 months  Notes to clinic:  no refills remain. Do you want to refill Rx?     Requested Prescriptions  Pending Prescriptions Disp Refills   EQ ALLERGY RELIEF, CETIRIZINE, 10 MG tablet [Pharmacy Med Name: EQ Allergy Relief (Cetirizine) 10 MG Oral Tablet] 90 tablet 0    Sig: Take 1 tablet by mouth once daily     Ear, Nose, and Throat:  Antihistamines 2 Failed - 05/14/2022  1:14 PM      Failed - Cr in normal range and within 360 days    Creatinine  Date Value Ref Range Status  05/25/2014 0.85 0.60 - 1.30 mg/dL Final   Creatinine, Ser  Date Value Ref Range Status  05/02/2022 1.37 (H) 0.76 - 1.27 mg/dL Final         Passed - Valid encounter within last 12 months    Recent Outpatient Visits           1 week ago Upper respiratory tract infection, unspecified type   [facility], Erin E, [name]   1 week ago Hypertension associated with diabetes ([place])   [place], [name], DO   1 month ago H/O mechanical aortic valve replacement   [facility], [name], DO   4 months ago Hypertension associated with diabetes ([place])   [place], Rutledge P, DO   1 year ago Diabetes mellitus type 2, diet-controlled ([place])   [place], Barbaraann Faster, NP       Future Appointments             In 3 weeks End, Harrell Gave, MD [facility]   In 4 months [name], Barb Merino, DO [employer]

## 2022-05-21 NOTE — Assessment & Plan Note (Signed)
Needs hospital bed to elevate his head at night to help him breathe.

## 2022-05-22 ENCOUNTER — Telehealth: Payer: Self-pay | Admitting: Family Medicine

## 2022-05-22 NOTE — Telephone Encounter (Signed)
Copied from [place] [phone]. Topic: General - Other >> May 22, 2022 11:55 AM Everette C wrote: Reason for CRM: The patient's friend would like to be contacted by a member of clinical staff to review their most recent labs  Please contact the patient's friend further when possible

## 2022-05-22 NOTE — Telephone Encounter (Signed)
[facility]

## 2022-05-22 NOTE — Telephone Encounter (Signed)
Left message for Roberto [name] to gather more information in regards to the questions to patient labs. Advised to give our office a call back to discuss.  OK to gather information if patient calls back.

## 2022-05-23 NOTE — Telephone Encounter (Signed)
Called and discussed lab results with [name]. Stated that per Dr. Durenda Age result note, lab work was stable.

## 2022-05-24 ENCOUNTER — Other Ambulatory Visit: Payer: Self-pay | Admitting: Surgery

## 2022-05-24 DIAGNOSIS — I7121 Aneurysm of the ascending aorta, without rupture: Secondary | ICD-10-CM

## 2022-05-24 DIAGNOSIS — Z952 Presence of prosthetic heart valve: Secondary | ICD-10-CM | POA: Diagnosis not present

## 2022-05-24 DIAGNOSIS — Z7901 Long term (current) use of anticoagulants: Secondary | ICD-10-CM | POA: Diagnosis not present

## 2022-05-29 ENCOUNTER — Encounter: Payer: Self-pay | Admitting: Physician Assistant

## 2022-05-29 ENCOUNTER — Ambulatory Visit (INDEPENDENT_AMBULATORY_CARE_PROVIDER_SITE_OTHER): Payer: Medicare Other | Admitting: Physician Assistant

## 2022-05-29 VITALS — BP 90/62 | HR 71 | Temp 99.3°F | Wt 310.2 lb

## 2022-05-29 DIAGNOSIS — I152 Hypertension secondary to endocrine disorders: Secondary | ICD-10-CM | POA: Diagnosis not present

## 2022-05-29 DIAGNOSIS — R251 Tremor, unspecified: Secondary | ICD-10-CM | POA: Diagnosis not present

## 2022-05-29 DIAGNOSIS — Z23 Encounter for immunization: Secondary | ICD-10-CM | POA: Diagnosis not present

## 2022-05-29 DIAGNOSIS — E1159 Type 2 diabetes mellitus with other circulatory complications: Secondary | ICD-10-CM | POA: Diagnosis not present

## 2022-05-29 NOTE — Assessment & Plan Note (Signed)
Chronic per patient, ongoing concern Reports he has had a tremor in his left hand and arm for years but it has become worse and is now affecting his ability to write and use dining utensils PE demonstrates left-sided intention tremor without apparent decrease to grip strength or sensation compared to right hand Will place referral to Neurology for further evaluation and management Will collaborate with Neurology as indicated by their recommendation Follow up in 3-4 months with PCP at regularly scheduled apt

## 2022-05-29 NOTE — Progress Notes (Signed)
Acute Office Visit   Patient: Roberto [name]   DOB: [birth year]   66 y.o. Male  MRN: [id number] Visit Date: 05/29/2022  Today's healthcare provider: Dani Gobble Tylasia Fletchall, [name]  Introduced myself to the patient as a Journalist, newspaper and provided education on APPs in clinical practice.    Chief Complaint  Patient presents with   Hypertension    Patient would like BP checked today, states it has been running low in the office past few visits. Does not check at home   Tremors    Patient states he has left arm and hand tremors.    Subjective    HPI HPI     Hypertension    Additional comments: Patient would like BP checked today, states it has been running low in the office past few visits. Does not check at home        Tremors    Additional comments: Patient states he has left arm and hand tremors.       Last edited by Louanna Raw, [place] on 05/29/2022  8:53 AM.       Hypertension: Reports concerns that BP has been low the past few times in the office  - Medications: Lisinopril 5 mg PO QD, metoprolol 100 mg PO QD, Lasix 40 mg  - Compliance: Good compliance  - Checking BP at home: does not check at home - has a meter at home  - Denies any SOB, CP, vision changes, LE edema, medication SEs, or symptoms of hypotension - Diet: He is not following any particular diet to help with BP management- states he does not do much salt  - Exercise: He is not exercising    Tremors  Reports he's had this for years and has been progressing  Now feels like it is pretty bad  States it is predominantly in his left hand- he is left hand dominant States he has difficulty with eating certain foods as he will shake food off his utensil States he is not able to write well anymore  Denies changes to grip strength in left hand vs right        Medications: Outpatient Medications Prior to Visit  Medication Sig   albuterol (PROVENTIL) (2.5 MG/3ML) 0.083% nebulizer solution USE 1 VIAL IN NEBULIZER  EVERY 6 HOURS AS NEEDED FOR WHEEZING FOR SHORTNESS OF BREATH J44.9   albuterol (VENTOLIN HFA) 108 (90 Base) MCG/ACT inhaler INHALE 2 PUFFS EVERY 6 HOURS AS NEEDED FOR WHEEZING FOR SHORTNESS OF BREATH   allopurinol (ZYLOPRIM) 300 MG tablet Take 1 tablet (300 mg total) by mouth daily.   aspirin 81 MG chewable tablet Chew 81 mg by mouth daily.   azithromycin (ZITHROMAX) 250 MG tablet Take '500mg'$  PO daily x1d and then '250mg'$  daily x4 days   Budeson-Glycopyrrol-Formoterol (BREZTRI AEROSPHERE) 160-9-4.8 MCG/ACT AERO Inhale 2 puffs into the lungs 2 (two) times daily.   cyclobenzaprine (FLEXERIL) 10 MG tablet Take 1 tablet (10 mg total) by mouth 3 times/day as needed-between meals & bedtime for muscle spasms.   dapagliflozin propanediol (FARXIGA) 10 MG TABS tablet Take 1 tablet (10 mg total) by mouth daily.   EQ ALLERGY RELIEF, CETIRIZINE, 10 MG tablet Take 1 tablet by mouth once daily   fenofibrate (TRICOR) 48 MG tablet Take 1 tablet (48 mg total) by mouth daily.   furosemide (LASIX) 40 MG tablet TAKE 2 TABLETS BY MOUTH IN THE MORNING AND 1 TABLET IN THE EVENING.   lisinopril (ZESTRIL) 5 MG  tablet Take 1 tablet (5 mg total) by mouth daily.   metFORMIN (GLUCOPHAGE-XR) 500 MG 24 hr tablet Take 1 tablet (500 mg total) by mouth daily with breakfast.   metoprolol succinate (TOPROL-XL) 100 MG 24 hr tablet TAKE 1 TABLET BY MOUTH ONCE DAILY WITH  OR  IMMEDIATELY  FOLLOWING  A  MEAL   nortriptyline (PAMELOR) 10 MG capsule Take 1 capsule (10 mg total) by mouth at bedtime.   Omega-3 Fatty Acids (FISH OIL) 1000 MG CAPS Take 5,000 mg by mouth daily.   potassium chloride SA (KLOR-CON M) 20 MEQ tablet Take 1 tablet (20 mEq total) by mouth daily.   rosuvastatin (CRESTOR) 40 MG tablet Take 1 tablet (40 mg total) by mouth daily.   Semaglutide, 1 MG/DOSE, 4 MG/3ML SOPN Inject 1 mg as directed once a week.   TRELEGY ELLIPTA 100-62.5-25 MCG/ACT AEPB Inhale 1 puff into the lungs daily.   warfarin (COUMADIN) 4 MG tablet TAKE 1  TABLET BY MOUTH ONCE DAILY AT 6PM AS DIRECTED   warfarin (COUMADIN) 7.5 MG tablet Take 1 tablet (7.5 mg total) by mouth 2 (two) times a week for 1 dose. Tuesdays and Fridays   No facility-administered medications prior to visit.    Review of Systems  Eyes:  Negative for discharge and itching.  Respiratory:  Negative for shortness of breath and wheezing.   Cardiovascular:  Negative for chest pain and palpitations.  Musculoskeletal:  Negative for gait problem.  Neurological:  Positive for tremors. Negative for dizziness, syncope, weakness, light-headedness and headaches.       Objective    BP 90/62   Pulse 71   Temp 99.3 F (37.4 C)   Wt (!) 310 lb 3.2 oz (140.7 kg)   SpO2 95%   BMI 50.09 kg/m    Physical Exam Vitals reviewed.  Constitutional:      General: He is awake.     Appearance: Normal appearance. He is well-developed and well-groomed. He is morbidly obese.  HENT:     Head: Normocephalic and atraumatic.  Cardiovascular:     Rate and Rhythm: Normal rate and regular rhythm.     Pulses: Normal pulses.          Radial pulses are 2+ on the right side and 2+ on the left side.     Heart sounds: Heart sounds are distant.     No friction rub. No gallop.  Musculoskeletal:     Cervical back: Normal range of motion.  Neurological:     Mental Status: He is alert and oriented to person, place, and time.     GCS: GCS eye subscore is 4. GCS verbal subscore is 5. GCS motor subscore is 6.     Cranial Nerves: No dysarthria or facial asymmetry.     Sensory: Sensation is intact.     Motor: Tremor present. No weakness, atrophy or abnormal muscle tone.     Gait: Gait is intact.     Comments: Apparent intention tremor of left hand and forearm  Grip strength symmetrical to right hand  ROM intact bilaterally  Sensation intact bilaterally   Psychiatric:        Behavior: Behavior is cooperative.       No results found for any visits on 05/29/22.  Assessment & Plan      No  follow-ups on file.      Problem List Items Addressed This Visit       Cardiovascular and Mediastinum   Hypertension associated with diabetes (  [place])    Chronic, historic condition States he is concerned that his BP has been low at previous office visits but has not been checking at home regularly Denies dizziness, lightheadedness, syncope, chest pain, vision changes at this time Recommend he check BP daily at home and record measures to review with PCP Call with concerns or if symptomatic before next follow up         Other   Tremor of left hand - Primary    Chronic per patient, ongoing concern Reports he has had a tremor in his left hand and arm for years but it has become worse and is now affecting his ability to write and use dining utensils PE demonstrates left-sided intention tremor without apparent decrease to grip strength or sensation compared to right hand Will place referral to Neurology for further evaluation and management Will collaborate with Neurology as indicated by their recommendation Follow up in 3-4 months with PCP at regularly scheduled apt       Relevant Orders   Ambulatory referral to Neurology   Other Visit Diagnoses     Need for influenza vaccination       Relevant Orders   Flu Vaccine QUAD High Dose(Fluad) (Completed)        No follow-ups on file.   I, Avrey Hyser E Latarra Eagleton, [name], have reviewed all documentation for this visit. The documentation on 05/29/22 for the exam, diagnosis, procedures, and orders are all accurate and complete.   Talitha Givens, MHS, [name] [facility]

## 2022-05-29 NOTE — Patient Instructions (Addendum)
I have placed a referral to Neurology for you for your tremor- please be on the lookout for the phone call to schedule your apt  Please check your blood pressure once per day and record it. If you start to notice dizziness, headaches, or feel like you are going to pass out and it is low- please let us know.

## 2022-05-29 NOTE — Assessment & Plan Note (Signed)
Chronic, historic condition States he is concerned that his BP has been low at previous office visits but has not been checking at home regularly Denies dizziness, lightheadedness, syncope, chest pain, vision changes at this time Recommend he check BP daily at home and record measures to review with PCP Call with concerns or if symptomatic before next follow up

## 2022-06-01 LAB — PROTIME-INR: INR: 2.9 — AB (ref 0.80–1.20)

## 2022-06-02 ENCOUNTER — Ambulatory Visit (INDEPENDENT_AMBULATORY_CARE_PROVIDER_SITE_OTHER): Payer: Medicare Other | Admitting: Family Medicine

## 2022-06-02 DIAGNOSIS — Z952 Presence of prosthetic heart valve: Secondary | ICD-10-CM

## 2022-06-05 ENCOUNTER — Other Ambulatory Visit: Payer: Self-pay | Admitting: Family Medicine

## 2022-06-05 ENCOUNTER — Other Ambulatory Visit: Payer: Self-pay | Admitting: Internal Medicine

## 2022-06-06 NOTE — Telephone Encounter (Signed)
Requested Prescriptions  Pending Prescriptions Disp Refills   albuterol (PROVENTIL) (2.5 MG/3ML) 0.083% nebulizer solution [Pharmacy Med Name: Albuterol Sulfate (2.5 MG/3ML) 0.083% Inhalation Nebulization Solution] 180 mL 1    Sig: USE 1 VIAL IN NEBULIZER EVERY 6 HOURS AS NEEDED FOR WHEEZING OR SHORTNESS OF BREATH     Pulmonology:  Beta Agonists 2 Passed - 06/05/2022  5:19 PM      Passed - Last BP in normal range    BP Readings from Last 1 Encounters:  05/29/22 90/62         Passed - Last Heart Rate in normal range    Pulse Readings from Last 1 Encounters:  05/29/22 71         Passed - Valid encounter within last 12 months    Recent Outpatient Visits           1 week ago Tremor of left hand   [employer], Erin E, [name]   1 month ago Upper respiratory tract infection, unspecified type   [employer], Erin E, [name]   1 month ago Hypertension associated with diabetes ([place])   [place], [name], DO   2 months ago H/O mechanical aortic valve replacement   [facility] [place], [name], DO   5 months ago Hypertension associated with diabetes [facility])   [place], Barb Merino, DO       Future Appointments             In 2 days End, Harrell Gave, MD [place]. [facility]   In 3 months [name], [name], DO [facility]             EQ ALLERGY RELIEF, CETIRIZINE, 10 MG tablet [Pharmacy Med Name: EQ Allergy Relief (Cetirizine) 10 MG Oral Tablet] 90 tablet 0    Sig: Take 1 tablet by mouth once daily     Ear, Nose, and Throat:  Antihistamines 2 Failed - 06/05/2022  5:19 PM      Failed - Cr in normal range and within 360 days    Creatinine  Date Value Ref Range Status  05/25/2014 0.85 0.60 - 1.30 mg/dL Final   Creatinine, Ser  Date Value Ref Range Status  05/02/2022 1.37 (H) 0.76 - 1.27 mg/dL Final         Passed - Valid encounter  within last 12 months    Recent Outpatient Visits           1 week ago Tremor of left hand   [facility] [place], Erin E, [name]   1 month ago Upper respiratory tract infection, unspecified type   [employer], Erin E, [name]   1 month ago Hypertension associated with diabetes ([place])   [place], [name], DO   2 months ago H/O mechanical aortic valve replacement   [place], [name], DO   5 months ago Hypertension associated with diabetes [facility])   [place], Barb Merino, DO       Future Appointments             In 2 days End, Harrell Gave, MD [place]. [facility]   In 3 months [name], [name], DO [facility]             fenofibrate (TRICOR) 48 MG tablet [  Pharmacy Med Name: Fenofibrate 48 MG Oral Tablet] 90 tablet 1    Sig: Take 1 tablet by mouth once daily     Cardiovascular:  Antilipid - Fibric Acid Derivatives Failed - 06/05/2022  5:19 PM      Failed - Cr in normal range and within 360 days    Creatinine  Date Value Ref Range Status  05/25/2014 0.85 0.60 - 1.30 mg/dL Final   Creatinine, Ser  Date Value Ref Range Status  05/02/2022 1.37 (H) 0.76 - 1.27 mg/dL Final         Failed - Lipid Panel in normal range within the last 12 months    Cholesterol, Total  Date Value Ref Range Status  12/23/2021 161 100 - 199 mg/dL Final   Cholesterol  Date Value Ref Range Status  05/25/2014 131 0 - 200 mg/dL Final   Cholesterol Piccolo, Waived  Date Value Ref Range Status  05/17/2017 177 <200 mg/dL Final    Comment:                            Desirable                <200                         Borderline High      200- 239                         High                     >239    Ldl Cholesterol, Calc  Date Value Ref Range Status  05/25/2014 68 0 - 100 mg/dL Final   LDL Chol Calc (NIH)  Date Value Ref Range Status   12/23/2021 57 0 - 99 mg/dL Final   Direct LDL  Date Value Ref Range Status  06/04/2019 96.4 0 - 99 mg/dL Final    Comment:    Performed at [facility], [place] [address], [place], [place] [address]   HDL Cholesterol  Date Value Ref Range Status  05/25/2014 30 (L) 40 - 60 mg/dL Final   HDL  Date Value Ref Range Status  12/23/2021 37 (L) >39 mg/dL Final   Triglycerides  Date Value Ref Range Status  12/23/2021 444 (H) 0 - 149 mg/dL Final  05/25/2014 167 0 - 200 mg/dL Final   Triglycerides Piccolo,Waived  Date Value Ref Range Status  05/17/2017 361 (H) <150 mg/dL Final    Comment:                            Normal                   <150                         Borderline High     150 - 199                         High                200 - 499                         Very  High                >499          Passed - ALT in normal range and within 360 days    ALT  Date Value Ref Range Status  12/23/2021 24 0 - 44 IU/L Final   SGPT (ALT)  Date Value Ref Range Status  05/24/2014 20 U/L Final    Comment:    14-63 NOTE: New Reference Range 01/20/14    ALT (SGPT) [place], [place]  Date Value Ref Range Status  05/17/2017 47 10 - 47 U/L Final         Passed - AST in normal range and within 360 days    AST  Date Value Ref Range Status  12/23/2021 22 0 - 40 IU/L Final   SGOT(AST)  Date Value Ref Range Status  05/24/2014 22 15 - 37 Unit/L Final   AST (SGOT) Piccolo, Waived  Date Value Ref Range Status  05/17/2017 31 11 - 38 U/L Final         Passed - HGB in normal range and within 360 days    Hemoglobin  Date Value Ref Range Status  12/23/2021 14.1 13.0 - 17.7 g/dL Final         Passed - HCT in normal range and within 360 days    Hematocrit  Date Value Ref Range Status  12/23/2021 42.9 37.5 - 51.0 % Final         Passed - PLT in normal range and within 360 days    Platelets  Date Value Ref Range Status  12/23/2021 249 150 - 450 x10E3/uL Final          Passed - WBC in normal range and within 360 days    WBC  Date Value Ref Range Status  12/23/2021 7.6 3.4 - 10.8 x10E3/uL Final  02/20/2019 8.2 4.0 - 10.5 K/uL Final         Passed - eGFR is 30 or above and within 360 days    EGFR (African American)  Date Value Ref Range Status  05/25/2014 >60 >93m/min Final   GFR calc Af Amer  Date Value Ref Range Status  05/12/2020 64 >59 mL/min/1.73 Final    Comment:    **In accordance with recommendations from the NKF-ASN Task force,**   [facility] is in the process of updating its eGFR calculation to the   2021 CKD-EPI creatinine equation that estimates kidney function   without a race variable.    EGFR (Non-African Amer.)  Date Value Ref Range Status  05/25/2014 >60 >648mmin Final    Comment:    eGFR values <6090min/1.73 m2 may be an indication of chronic kidney disease (CKD). Calculated eGFR, using the MRDR Study equation, is useful in  patients with stable renal function. The eGFR calculation will not be reliable in acutely ill patients when serum creatinine is changing rapidly. It is not useful in patients on dialysis. The eGFR calculation may not be applicable to patients at the low and high extremes of body sizes, pregnant women, and vegetarians.    GFR, Estimated  Date Value Ref Range Status  08/26/2020 51 (L) >60 mL/min Final    Comment:    (NOTE) Calculated using the CKD-EPI Creatinine Equation (2021)    eGFR  Date Value Ref Range Status  05/02/2022 57 (L) >59 mL/min/1.73 Final         Passed - Valid encounter within last 12 months    Recent Outpatient  Visits           1 week ago Tremor of left hand   [facility] Mec[facility], Erin E, [name]   1 month ago Upper respiratory tract infection, unspecified type   [employer], Erin E, [name]   1 month ago Hypertension associated with diabetes [facility])   [place], [name], DO   2 months ago H/O mechanical aortic valve  replacement   [facility] [place], [name], DO   5 months ago Hypertension associated with diabetes [facility])   [place], [place] P, DO       Future Appointments             In 2 days End, Harrell Gave, MD [place]. [place]   In 3 months [name], [name], DO [facility]             allopurinol (ZYLOPRIM) 300 MG tablet [employer] Med Name: Allopurinol 300 MG Oral Tablet] 90 tablet 1    Sig: Take 1 tablet by mouth once daily     Endocrinology:  Gout Agents - allopurinol Failed - 06/05/2022  5:19 PM      Failed - Cr in normal range and within 360 days    Creatinine  Date Value Ref Range Status  05/25/2014 0.85 0.60 - 1.30 mg/dL Final   Creatinine, Ser  Date Value Ref Range Status  05/02/2022 1.37 (H) 0.76 - 1.27 mg/dL Final         Passed - Uric Acid in normal range and within 360 days    Uric Acid  Date Value Ref Range Status  12/23/2021 7.3 3.8 - 8.4 mg/dL Final    Comment:               Therapeutic target for gout patients: <6.0         Passed - Valid encounter within last 12 months    Recent Outpatient Visits           1 week ago Tremor of left hand   [facility] [place], Erin E, [name]   1 month ago Upper respiratory tract infection, unspecified type   [employer], Erin E, [name]   1 month ago Hypertension associated with diabetes ([place])   [place], [name], DO   2 months ago H/O mechanical aortic valve replacement   [facility] [place], [name], DO   5 months ago Hypertension associated with diabetes [facility])   [place], Barb Merino, DO       Future Appointments             In 2 days End, Harrell Gave, MD [place]. [facility]   In 3 months [name], [name], DO [facility]            Passed - CBC within normal limits  and completed in the last 12 months    WBC  Date Value Ref Range Status  12/23/2021 7.6 3.4 - 10.8 x10E3/uL Final  02/20/2019 8.2 4.0 - 10.5 K/uL Final   RBC  Date Value Ref Range Status  12/23/2021 4.95 4.14 - 5.80 x10E6/uL Final  02/20/2019 4.73 4.22 - 5.81 MIL/uL Final   Hemoglobin  Date Value Ref Range Status  12/23/2021 14.1 13.0 - 17.7 g/dL Final   Hematocrit  Date Value  Ref Range Status  12/23/2021 42.9 37.5 - 51.0 % Final   MCHC  Date Value Ref Range Status  12/23/2021 32.9 31.5 - 35.7 g/dL Final  02/20/2019 32.1 30.0 - 36.0 g/dL Final   [facility]  Date Value Ref Range Status  12/23/2021 28.5 26.6 - 33.0 pg Final  02/20/2019 29.8 26.0 - 34.0 pg Final   MCV  Date Value Ref Range Status  12/23/2021 87 79 - 97 fL Final  05/25/2014 88 80 - 100 fL Final   No results found for: "PLTCOUNTKUC", "LABPLAT", "POCPLA" RDW  Date Value Ref Range Status  12/23/2021 13.6 11.6 - 15.4 % Final  05/25/2014 13.9 11.5 - 14.5 % Final

## 2022-06-07 ENCOUNTER — Other Ambulatory Visit: Payer: Self-pay | Admitting: Family Medicine

## 2022-06-07 ENCOUNTER — Telehealth: Payer: Self-pay | Admitting: Family Medicine

## 2022-06-07 NOTE — Telephone Encounter (Signed)
ICD code added and resnt to pharmacy Requested Prescriptions  Pending Prescriptions Disp Refills   albuterol (PROVENTIL) (2.5 MG/3ML) 0.083% nebulizer solution [Pharmacy Med Name: ALBUTEROL 0.083% 3ML NEB] 180 mL 1    Sig: USE 1 VIAL IN NEBULIZER EVERY 6 HOURS AS NEEDED FOR WHEEZING FOR SHORTNESS OF BREATH     Pulmonology:  Beta Agonists 2 Passed - 06/07/2022  2:11 PM      Passed - Last BP in normal range    BP Readings from Last 1 Encounters:  05/29/22 90/62         Passed - Last Heart Rate in normal range    Pulse Readings from Last 1 Encounters:  05/29/22 71         Passed - Valid encounter within last 12 months    Recent Outpatient Visits           1 week ago Tremor of left hand   [employer], Erin E, [name]   1 month ago Upper respiratory tract infection, unspecified type   [employer], Erin E, [name]   1 month ago Hypertension associated with diabetes ([place])   [place], [name], DO   2 months ago H/O mechanical aortic valve replacement   [facility], [name], DO   5 months ago Hypertension associated with diabetes [facility])   [place], [place], DO       Future Appointments             In 1 month End, Harrell Gave, MD [place]. [place]   In 3 months [name], Barb Merino, DO [facility], [facility]

## 2022-06-07 NOTE — Telephone Encounter (Signed)
Heather from [employer] says needs [id number] code sent with request for med, albuterol

## 2022-06-08 ENCOUNTER — Ambulatory Visit: Payer: Medicare Other | Admitting: Internal Medicine

## 2022-06-08 NOTE — Telephone Encounter (Signed)
Spoke with pharmacist and was informed to disregard the message as the received a new Rx with the ICD-10 code on it.

## 2022-06-12 ENCOUNTER — Other Ambulatory Visit: Payer: Self-pay | Admitting: Family Medicine

## 2022-06-12 ENCOUNTER — Encounter: Payer: Self-pay | Admitting: Family Medicine

## 2022-06-12 LAB — PROTIME-INR: INR: 2.5 — AB (ref 0.80–1.20)

## 2022-06-13 NOTE — Telephone Encounter (Signed)
Requested medications are due for refill today.  unsure  Requested medications are on the active medications list.  yes  Last refill. 02/20/2022 #30 0 rf  Future visit scheduled.   yes  Notes to clinic.  Refill not delegated. Rx expired 02/21/2022.    Requested Prescriptions  Pending Prescriptions Disp Refills   warfarin (COUMADIN) 7.5 MG tablet [Pharmacy Med Name: Warfarin Sodium 7.5 MG Oral Tablet] 30 tablet 0    Sig: TAKE '4MG'$  BY MOUTH FOR 2 DAYS, THEN 7.'5MG'$  ON THE THIRD DAY, THEN REPEAT.     Hematology:  Anticoagulants - warfarin Failed - 06/12/2022 10:14 AM      Failed - Manual Review: If patient's warfarin is managed by Anti-Coag team, route request to them. If not, route request to the provider.      Failed - INR in normal range and within 30 days    INR  Date Value Ref Range Status  06/01/2022 2.90 (A) 0.80 - 1.20 Final  03/31/2022 3.3 (H) 0.9 - 1.1 Final  05/25/2014 1.9  Final    Comment:    INR reference interval applies to patients on anticoagulant therapy. A single INR therapeutic range for coumarins is not optimal for all indications; however, the suggested range for most indications is 2.0 - 3.0. Exceptions to the INR Reference Range may include: Prosthetic heart valves, acute myocardial infarction, prevention of myocardial infarction, and combinations of aspirin and anticoagulant. The need for a higher or lower target INR must be assessed individually. Reference: The Pharmacology and Management of the Vitamin K  antagonists: the seventh ACCP Conference on Antithrombotic and Thrombolytic Therapy. KKXFG.1829 Sept:126 (3suppl): [id number]. A HCT value >55% may artifactually increase the PT.  In one study,  the increase was an average of 25%. Reference:  "Effect on Routine and Special Coagulation Testing Values of Citrate Anticoagulant Adjustment in Patients with High HCT Values." American Journal of Clinical Pathology 2006;126:400-405.          Passed - HCT in  normal range and within 360 days    Hematocrit  Date Value Ref Range Status  12/23/2021 42.9 37.5 - 51.0 % Final         Passed - Patient is not pregnant      Passed - Valid encounter within last 3 months    Recent Outpatient Visits           2 weeks ago Tremor of left hand   [facility] [place], Erin E, [name]   1 month ago Upper respiratory tract infection, unspecified type   [employer], Erin E, [name]   1 month ago Hypertension associated with diabetes ([place])   [place], [name], DO   2 months ago H/O mechanical aortic valve replacement   [facility] [place], [name], DO   5 months ago Hypertension associated with diabetes [facility])   [place], Barb Merino, DO       Future Appointments             [place], Harrell Gave, [place]. [place]   In 3 months [name], Barb Merino, DO [facility], [facility]

## 2022-06-14 ENCOUNTER — Encounter: Payer: Self-pay | Admitting: Internal Medicine

## 2022-06-14 ENCOUNTER — Ambulatory Visit: Payer: Medicare Other | Attending: Internal Medicine | Admitting: Internal Medicine

## 2022-06-14 VITALS — BP 90/66 | HR 71 | Ht 66.0 in | Wt 308.0 lb

## 2022-06-14 DIAGNOSIS — E1169 Type 2 diabetes mellitus with other specified complication: Secondary | ICD-10-CM | POA: Diagnosis not present

## 2022-06-14 DIAGNOSIS — Z952 Presence of prosthetic heart valve: Secondary | ICD-10-CM | POA: Insufficient documentation

## 2022-06-14 DIAGNOSIS — I7121 Aneurysm of the ascending aorta, without rupture: Secondary | ICD-10-CM | POA: Diagnosis not present

## 2022-06-14 DIAGNOSIS — I5022 Chronic systolic (congestive) heart failure: Secondary | ICD-10-CM | POA: Diagnosis not present

## 2022-06-14 DIAGNOSIS — E785 Hyperlipidemia, unspecified: Secondary | ICD-10-CM

## 2022-06-14 DIAGNOSIS — I2510 Atherosclerotic heart disease of native coronary artery without angina pectoris: Secondary | ICD-10-CM | POA: Diagnosis not present

## 2022-06-14 MED ORDER — POTASSIUM CHLORIDE CRYS ER 20 MEQ PO TBCR: 20.0000 meq | EXTENDED_RELEASE_TABLET | Freq: Every day | ORAL | 3 refills | Status: DC

## 2022-06-14 MED ORDER — METOPROLOL SUCCINATE ER 100 MG PO TB24: ORAL_TABLET | ORAL | 3 refills | Status: DC

## 2022-06-14 MED ORDER — FENOFIBRATE 48 MG PO TABS: 48.0000 mg | ORAL_TABLET | Freq: Every day | ORAL | 3 refills | Status: DC

## 2022-06-14 MED ORDER — ROSUVASTATIN CALCIUM 40 MG PO TABS: 40.0000 mg | ORAL_TABLET | Freq: Every day | ORAL | 3 refills | Status: DC

## 2022-06-14 MED ORDER — LISINOPRIL 2.5 MG PO TABS: 2.5000 mg | ORAL_TABLET | Freq: Every day | ORAL | 3 refills | Status: DC

## 2022-06-14 NOTE — Patient Instructions (Addendum)
Medication Instructions:  You provider recommends the following changes.  DECREASE: Lisinopril to 2.5 mg by mouth daily   *If you need a refill on your cardiac medications before your next appointment, please call your pharmacy*   Lab Work: None ordered today   Testing/Procedures: None ordered today   Follow-Up: At [facility], you and your health needs are our priority.  As part of our continuing mission to provide you with exceptional heart care, we have created designated Provider Care Teams.  These Care Teams include your primary Cardiologist (physician) and Advanced Practice Providers (APPs -  Physician Assistants and Nurse Practitioners) who all work together to provide you with the care you need, when you need it.  We recommend signing up for the patient portal called "MyChart".  Sign up information is provided on this After Visit Summary.  MyChart is used to connect with patients for Virtual Visits (Telemedicine).  Patients are able to view lab/test results, encounter notes, upcoming appointments, etc.  Non-urgent messages can be sent to your provider as well.   To learn more about what you can do with MyChart, go to [email or web address].    Your next appointment:   3 month(s)  The format for your next appointment:   In Person  Provider:   You may see Nelva Bush, MD or one of the following Advanced Practice Providers on your designated Care Team:   Murray Hodgkins, NP Christell Faith, [name] Cadence Kathlen Mody, [name] Gerrie Nordmann, NP

## 2022-06-14 NOTE — Progress Notes (Signed)
Follow-up Outpatient Visit Date: 06/14/2022  Primary Care Provider: Valerie Roys, DO [place] [address]  Chief Complaint: Follow-up valvular heart disease and aortic aneurysm  HPI:  Roberto [name] is a 66 y.o. male with history of mechanical aortic valve replacement, non-obstructive coronary artery disease, thoracic aortic aneurysm (followed by [facility]), stroke, right renal infarct, chronic systolic heart failure (LVEF 40-45%), hyperlipidemia, obstructive sleep apnea not using CPAP, and morbid obesity, who presents for follow-up of valvular heart disease, HFrEF, and thoracic aortic aneurysm.  We last spoke via virtual visit in August, at which time he was feeling well other than some continued weakness in his right leg after his knee "gave out" on him.  Repeat echo in October showed LVEF 35-40% (previously 40-45% in 01/2019).  Today, Roberto [name] reports that he has been feeling fairly well.  He is still bothered by some weakness and pain in his right leg, present ever since his right knee buckled and he fell a few months ago.  He has also noticed worsening of a chronic tremor for which he is scheduled to see neurology in a few weeks.  He has not had any chest pain, shortness of breath, palpitations, lightheadedness, or edema.  He has not monitoring his blood pressure at home.  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  --------------------------------------------------------------------------------------------------  Past Medical History:  Diagnosis Date   Bicuspid aortic valve    a. s/p #27 Carbomedics mechanical valve on 03/25/2010; b. on Coumadin; c. TTE 12/17: EF 40-45%, moderately dilated LV with moderate LVH, AVR well-seated with 14 mmHg gradient, peak AV velocity 2.5 m/s, mild mitral valve thickening with mild MR, mildly dilated RV with mildly reduced contraction   Cellulitis    Chronic systolic CHF (congestive heart failure)  ([place])    a. R/LHC 03/2010 showed no significant CAD, LVEDP 31 mmHg, mean AoV gradient 34 mmHg at rest and 47 mmHg with dobutamine 20 mcg/kg/min, AVA 1.0 cm^2, RA 31, RV 68/25, PA 68/47, PCWP 38. PA sat 65%. CO 6.2 L/min ([place]) and 5.3 L/min (thermodilution)   Clotting disorder (HCC)    H/O mechanical aortic valve replacement 03/25/2010   a. #27 Carbomedics mechanical valve   Hypercholesterolemia    Renal infarct [facility]) 2017   Multiple right renal infarcts, likely embolic.   Stroke [facility])    TIA (transient ischemic attack) 05/2014   Past Surgical History:  Procedure Laterality Date   AORTIC VALVE REPLACEMENT     CARDIAC CATHETERIZATION  03/21/2010   No significant CAD. Severe aortic stenosis. Severely elevated left and right heart filling pressures.   CARDIAC SURGERY  2009   CHF   CARPAL TUNNEL RELEASE Left 2005   RIGHT HEART CATH AND CORONARY ANGIOGRAPHY N/A 01/28/2019   Procedure: RIGHT HEART CATH AND CORONARY ANGIOGRAPHY;  Surgeon: Nelva Bush, MD;  Location: [place] [facility];  Service: Cardiovascular;  Laterality: N/A;   TONSILLECTOMY  1962    Current Meds  Medication Sig   albuterol (PROVENTIL) (2.5 MG/3ML) 0.083% nebulizer solution USE 1 VIAL IN NEBULIZER EVERY 6 HOURS AS NEEDED FOR WHEEZING FOR SHORTNESS OF BREATH   albuterol (VENTOLIN HFA) 108 (90 Base) MCG/ACT inhaler INHALE 2 PUFFS EVERY 6 HOURS AS NEEDED FOR WHEEZING FOR SHORTNESS OF BREATH   allopurinol (ZYLOPRIM) 300 MG tablet Take 1 tablet by mouth once daily   aspirin 81 MG chewable tablet Chew 81 mg by mouth daily.   Budeson-Glycopyrrol-Formoterol (  BREZTRI AEROSPHERE) 160-9-4.8 MCG/ACT AERO Inhale 2 puffs into the lungs 2 (two) times daily.   cyclobenzaprine (FLEXERIL) 10 MG tablet Take 1 tablet (10 mg total) by mouth 3 times/day as needed-between meals & bedtime for muscle spasms.   dapagliflozin propanediol (FARXIGA) 10 MG TABS tablet Take 1 tablet (10 mg total) by mouth daily.   EQ ALLERGY RELIEF, CETIRIZINE,  10 MG tablet Take 1 tablet by mouth once daily   fenofibrate (TRICOR) 48 MG tablet Take 1 tablet by mouth once daily   furosemide (LASIX) 40 MG tablet TAKE 2 TABLETS BY MOUTH IN THE MORNING AND 1 TABLET IN THE EVENING   lisinopril (ZESTRIL) 5 MG tablet Take 1 tablet (5 mg total) by mouth daily.   metFORMIN (GLUCOPHAGE-XR) 500 MG 24 hr tablet Take 1 tablet (500 mg total) by mouth daily with breakfast.   metoprolol succinate (TOPROL-XL) 100 MG 24 hr tablet TAKE 1 TABLET BY MOUTH ONCE DAILY WITH  OR  IMMEDIATELY  FOLLOWING  A  MEAL   nortriptyline (PAMELOR) 10 MG capsule Take 1 capsule (10 mg total) by mouth at bedtime.   Omega-3 Fatty Acids (FISH OIL) 1000 MG CAPS Take 5,000 mg by mouth daily.   potassium chloride SA (KLOR-CON M) 20 MEQ tablet Take 1 tablet (20 mEq total) by mouth daily.   rosuvastatin (CRESTOR) 40 MG tablet Take 1 tablet (40 mg total) by mouth daily.   Semaglutide, 1 MG/DOSE, 4 MG/3ML SOPN Inject 1 mg as directed once a week.   TRELEGY ELLIPTA 100-62.5-25 MCG/ACT AEPB Inhale 1 puff into the lungs daily.   warfarin (COUMADIN) 4 MG tablet TAKE 1 TABLET BY MOUTH ONCE DAILY AT 6PM AS DIRECTED   warfarin (COUMADIN) 7.5 MG tablet TAKE '4MG'$  BY MOUTH FOR 2 DAYS, THEN 7.'5MG'$  ON THE THIRD DAY, THEN REPEAT.    Allergies: Patient has no known allergies.  Social History   Tobacco Use   Smoking status: Former    Packs/day: 0.50    Years: 46.00    Total pack years: 23.00    Types: Cigarettes    Start date: 07/16/2017    Quit date: 05/11/2019    Years since quitting: 3.0   Smokeless tobacco: Never  Vaping Use   Vaping Use: Never used  Substance Use Topics   Alcohol use: No    Alcohol/week: 0.0 standard drinks of alcohol   Drug use: No    Family History  Problem Relation Age of Onset   Arthritis Mother    Dementia Mother    Colon cancer Mother    Arthritis Father    Diabetes Father    Stroke Father    Colon cancer Father    Heart attack Brother    Breast cancer Sister     Seizures Sister    Cancer Brother        brain   Heart disease Brother    Heart attack Brother     Review of Systems: A 12-system review of systems was performed and was negative except as noted in the HPI.  --------------------------------------------------------------------------------------------------  Physical Exam: BP 90/66 (BP Location: Left Arm)   Pulse 71   Ht '5\' 6"'$  (1.676 m)   Wt (!) 308 lb (139.7 kg)   SpO2 97%   BMI 49.71 kg/m  Repeat BP: 90/66  General:  NAD. Neck: No JVD or HJR. Lungs: Mildly diminished breath sounds throughout without wheezes or crackles. Heart: Distant heart sounds.  Regular rate and rhythm with mechanical S2 and 1/6 systolic  murmur. Abdomen: Obese but soft and nontender. Extremities: No lower extremity edema.  EKG: Normal sinus rhythm with first-degree AV block, IVCD, and nonspecific T wave abnormality.  No significant change from prior tracing on 10/12/2021.  Lab Results  Component Value Date   WBC 7.6 12/23/2021   HGB 14.1 12/23/2021   HCT 42.9 12/23/2021   MCV 87 12/23/2021   PLT 249 12/23/2021    Lab Results  Component Value Date   NA 141 05/02/2022   K 4.5 05/02/2022   CL 101 05/02/2022   CO2 22 05/02/2022   BUN 33 (H) 05/02/2022   CREATININE 1.37 (H) 05/02/2022   GLUCOSE 102 (H) 05/02/2022   ALT 24 12/23/2021    Lab Results  Component Value Date   CHOL 161 12/23/2021   HDL 37 (L) 12/23/2021   LDLCALC 57 12/23/2021   LDLDIRECT 96.4 06/04/2019   TRIG 444 (H) 12/23/2021   CHOLHDL 4.3 08/08/2019    --------------------------------------------------------------------------------------------------  ASSESSMENT AND PLAN: Chronic HFrEF: Mr. Lotter appears grossly euvolemic with stable NYHA class II symptoms.  Due to his soft blood pressure, we have agreed to decrease lisinopril to 2.5 mg daily.  Continue current dose of metoprolol succinate 100 mg daily with low threshold to de-escalate further if blood pressure remains  soft or PR interval lengthens.  Continue dapagliflozin and furosemide as well.  Nonobstructive CAD: No angina reported.  Continue medical therapy to prevent progression.  Thoracic aortic aneurysm: Ongoing management per Dr. Cyndia Bent.  Status post mechanical AVR: No evidence of valve dysfunction.  Continue aspirin and warfarin as well as SBE prophylaxis.  Hyperlipidemia associated with type 2 diabetes mellitus: LDL well-controlled on last check.  Triglycerides were elevated, however.  Continue rosuvastatin 40 mg daily as well as fenofibrate and omega-3 (could consider switching to Vascepa in the future if not cost prohibitive).  Ongoing management of diabetes mellitus per Dr. Wynetta Emery.  Morbid obesity: BMI remains almost 50.  Weight loss encouraged through diet and exercise (moderate intensity cardiovascular activity and low resistance training recommended; patient cautioned against performing heavy lifting or other activities that would necessitate Valsalva in the setting of his thoracic aortic aneurysm).  Follow-up: Return to clinic in 3 months.  Nelva Bush, MD 06/14/2022 9:33 AM

## 2022-06-16 ENCOUNTER — Other Ambulatory Visit: Payer: Self-pay | Admitting: Family Medicine

## 2022-06-16 ENCOUNTER — Encounter: Payer: Self-pay | Admitting: Internal Medicine

## 2022-06-16 NOTE — Telephone Encounter (Signed)
Refilled 06/06/2022 #90 1 rf-conformed by same pharmacy. Requested Prescriptions  Pending Prescriptions Disp Refills   allopurinol (ZYLOPRIM) 300 MG tablet [Pharmacy Med Name: Allopurinol 300 MG Oral Tablet] 90 tablet 0    Sig: Take 1 tablet by mouth once daily     Endocrinology:  Gout Agents - allopurinol Failed - 06/16/2022 12:38 PM      Failed - Cr in normal range and within 360 days    Creatinine  Date Value Ref Range Status  05/25/2014 0.85 0.60 - 1.30 mg/dL Final   Creatinine, Ser  Date Value Ref Range Status  05/02/2022 1.37 (H) 0.76 - 1.27 mg/dL Final         Passed - Uric Acid in normal range and within 360 days    Uric Acid  Date Value Ref Range Status  12/23/2021 7.3 3.8 - 8.4 mg/dL Final    Comment:               Therapeutic target for gout patients: <6.0         Passed - Valid encounter within last 12 months    Recent Outpatient Visits           2 weeks ago Tremor of left hand   [facility] M[place], Erin E, [name]   1 month ago Upper respiratory tract infection, unspecified type   [employer], Erin E, [name]   1 month ago Hypertension associated with diabetes ([place])   [place], [name], DO   2 months ago H/O mechanical aortic valve replacement   [facility] Mel[place], [name], DO   5 months ago Hypertension associated with diabetes [facility])   [place], [place] P, DO       Future Appointments             In 3 months End, Harrell Gave, MD [facility] [place]. [facility]   In 3 months [name], [name], DO [facility], [facility]            Passed - CBC within normal limits and completed in the last 12 months    WBC  Date Value Ref Range Status  12/23/2021 7.6 3.4 - 10.8 x10E3/uL Final  02/20/2019 8.2 4.0 - 10.5 K/uL Final   RBC  Date Value Ref Range Status  12/23/2021 4.95 4.14 - 5.80 x10E6/uL Final  02/20/2019 4.73 4.22 - 5.81  MIL/uL Final   Hemoglobin  Date Value Ref Range Status  12/23/2021 14.1 13.0 - 17.7 g/dL Final   Hematocrit  Date Value Ref Range Status  12/23/2021 42.9 37.5 - 51.0 % Final   MCHC  Date Value Ref Range Status  12/23/2021 32.9 31.5 - 35.7 g/dL Final  02/20/2019 32.1 30.0 - 36.0 g/dL Final   [facility]  Date Value Ref Range Status  12/23/2021 28.5 26.6 - 33.0 pg Final  02/20/2019 29.8 26.0 - 34.0 pg Final   MCV  Date Value Ref Range Status  12/23/2021 87 79 - 97 fL Final  05/25/2014 88 80 - 100 fL Final   No results found for: "PLTCOUNTKUC", "LABPLAT", "POCPLA" RDW  Date Value Ref Range Status  12/23/2021 13.6 11.6 - 15.4 % Final  05/25/2014 13.9 11.5 - 14.5 % Final

## 2022-06-20 ENCOUNTER — Ambulatory Visit (INDEPENDENT_AMBULATORY_CARE_PROVIDER_SITE_OTHER): Payer: Medicare Other | Admitting: Family Medicine

## 2022-06-20 DIAGNOSIS — Z952 Presence of prosthetic heart valve: Secondary | ICD-10-CM

## 2022-06-20 LAB — PROTIME-INR: INR: 2.8 — AB (ref 0.80–1.20)

## 2022-06-21 ENCOUNTER — Ambulatory Visit (INDEPENDENT_AMBULATORY_CARE_PROVIDER_SITE_OTHER): Payer: Medicare Other | Admitting: Surgery

## 2022-06-21 ENCOUNTER — Ambulatory Visit: Payer: Self-pay | Admitting: Family Medicine

## 2022-06-21 ENCOUNTER — Encounter: Payer: Self-pay | Admitting: Surgery

## 2022-06-21 ENCOUNTER — Ambulatory Visit
Admission: RE | Admit: 2022-06-21 | Discharge: 2022-06-21 | Disposition: A | Discharge status: Home / Self Care | Payer: Medicare Other | Source: Ambulatory Visit | Attending: Surgery | Admitting: Surgery

## 2022-06-21 VITALS — BP 95/65 | HR 75 | Resp 20 | Ht 66.0 in | Wt 306.0 lb

## 2022-06-21 DIAGNOSIS — I7121 Aneurysm of the ascending aorta, without rupture: Secondary | ICD-10-CM | POA: Diagnosis not present

## 2022-06-21 DIAGNOSIS — I2510 Atherosclerotic heart disease of native coronary artery without angina pectoris: Secondary | ICD-10-CM

## 2022-06-21 DIAGNOSIS — Z952 Presence of prosthetic heart valve: Secondary | ICD-10-CM

## 2022-06-21 DIAGNOSIS — I700 Atherosclerosis of aorta: Secondary | ICD-10-CM | POA: Diagnosis not present

## 2022-06-21 NOTE — Progress Notes (Signed)
HPI:  The patient is a 66 year old gentleman with a history of mechanical aortic valve replacement with a 27 mm [employer] valve on 03/25/2010 in [place] for bicuspid aortic valve disease.  He said that the surgery was done urgently.  It is not known if he had an aortic aneurysm at that time but he had been followed by Dr. Murvin Natal in our practice with a 5.7 cm fusiform ascending aortic aneurysm. It was felt that he would be very high risk for redo sternotomy and replacement of his ascending aorta due to a history of morbid obesity, nonobstructive coronary disease, previous stroke, embolic right renal infarct, stage III chronic kidney disease, chronic systolic congestive heart failure with ejection fraction of 40 to 45%, OSA not on CPAP, and hyperlipidemia.  I last saw him in the office on 12/14/2021 and CTA of the chest at that time showed a 5.6 cm fusiform ascending aortic aneurysm.  This has gradually increased in size since 2017 but remained stable over the past few years.  He is here today with his wife.  He continues to feel well overall.  He denies any chest or back pain or shortness of breath.  He denies exertional fatigue.  He has had no orthopnea or peripheral edema.  He has been getting some physical activity and watching his diet and has lost about 20 pounds since I last saw him. Current Outpatient Medications  Medication Sig Dispense Refill   albuterol (PROVENTIL) (2.5 MG/3ML) 0.083% nebulizer solution USE 1 VIAL IN NEBULIZER EVERY 6 HOURS AS NEEDED FOR WHEEZING FOR SHORTNESS OF BREATH 180 mL 1   albuterol (VENTOLIN HFA) 108 (90 Base) MCG/ACT inhaler INHALE 2 PUFFS EVERY 6 HOURS AS NEEDED FOR WHEEZING FOR SHORTNESS OF BREATH 54 g 6   allopurinol (ZYLOPRIM) 300 MG tablet Take 1 tablet by mouth once daily 90 tablet 1   aspirin 81 MG chewable tablet Chew 81 mg by mouth daily.     Budeson-Glycopyrrol-Formoterol (BREZTRI AEROSPHERE) 160-9-4.8 MCG/ACT AERO Inhale 2 puffs into the lungs 2  (two) times daily. 10.7 g 11   cyclobenzaprine (FLEXERIL) 10 MG tablet Take 1 tablet (10 mg total) by mouth 3 times/day as needed-between meals & bedtime for muscle spasms. 180 tablet 0   dapagliflozin propanediol (FARXIGA) 10 MG TABS tablet Take 1 tablet (10 mg total) by mouth daily. 90 tablet 3   EQ ALLERGY RELIEF, CETIRIZINE, 10 MG tablet Take 1 tablet by mouth once daily 90 tablet 0   fenofibrate (TRICOR) 48 MG tablet Take 1 tablet (48 mg total) by mouth daily. 90 tablet 3   furosemide (LASIX) 40 MG tablet TAKE 2 TABLETS BY MOUTH IN THE MORNING AND 1 TABLET IN THE EVENING 270 tablet 0   lisinopril (ZESTRIL) 2.5 MG tablet Take 1 tablet (2.5 mg total) by mouth daily. 90 tablet 3   metFORMIN (GLUCOPHAGE-XR) 500 MG 24 hr tablet Take 1 tablet (500 mg total) by mouth daily with breakfast. 90 tablet 1   metoprolol succinate (TOPROL-XL) 100 MG 24 hr tablet TAKE 1 TABLET BY MOUTH ONCE DAILY WITH  OR  IMMEDIATELY  FOLLOWING  A  MEAL 90 tablet 3   nortriptyline (PAMELOR) 10 MG capsule Take 1 capsule (10 mg total) by mouth at bedtime. 90 capsule 1   Omega-3 Fatty Acids (FISH OIL) 1000 MG CAPS Take 5,000 mg by mouth daily.     potassium chloride SA (KLOR-CON M) 20 MEQ tablet Take 1 tablet (20 mEq total) by mouth daily. [place]  tablet 3   rosuvastatin (CRESTOR) 40 MG tablet Take 1 tablet (40 mg total) by mouth daily. 90 tablet 3   Semaglutide, 1 MG/DOSE, 4 MG/3ML SOPN Inject 1 mg as directed once a week. 3 mL 6   TRELEGY ELLIPTA 100-62.5-25 MCG/ACT AEPB Inhale 1 puff into the lungs daily. 3 each 3   warfarin (COUMADIN) 4 MG tablet TAKE 1 TABLET BY MOUTH ONCE DAILY AT 6PM AS DIRECTED 180 tablet 3   warfarin (COUMADIN) 7.5 MG tablet TAKE '4MG'$  BY MOUTH FOR 2 DAYS, THEN 7.'5MG'$  ON THE THIRD DAY, THEN REPEAT. 90 tablet 1   No current facility-administered medications for this visit.     Physical Exam: BP 95/65 (BP Location: Left Arm, Patient Position: Sitting)   Pulse 75   Resp 20   Ht '5\' 6"'$  (1.676 m)   Wt (!)  306 lb (138.8 kg)   SpO2 96% Comment: RA  BMI 49.39 kg/m  He looks well overall. Cardiac exam shows regular rate and rhythm with a crisp mechanical valve click.  There is no murmur. Lungs clear. There is no peripheral edema.  Diagnostic Tests:  Narrative & Impression  CLINICAL DATA:  Follow-up TAA, status post aortic valve replacement   EXAM: CT CHEST WITHOUT CONTRAST   TECHNIQUE: Multidetector CT imaging of the chest was performed following the standard protocol without IV contrast.   RADIATION DOSE REDUCTION: This exam was performed according to the departmental dose-optimization program which includes automated exposure control, adjustment of the mA and/or kV according to patient size and/or use of iterative reconstruction technique.   COMPARISON:  12/14/2021   FINDINGS: Cardiovascular: Aortic atherosclerosis. Status post median sternotomy and aortic valve replacement. Unchanged enlargement of the tubular ascending thoracic aorta measuring up to 5.5 x 5.5 cm (series 2, image 59). The distal arch and descending thoracic aorta are normal in caliber, the descending vessel measuring up to 2.9 x 2.9 cm in the midportion of the vessel. Normal heart size. Left and right coronary artery calcifications. No pericardial effusion.   Mediastinum/Nodes: No enlarged mediastinal, hilar, or axillary lymph nodes. Thyroid gland, trachea, and esophagus demonstrate no significant findings.   Lungs/Pleura: Lungs are clear. No pleural effusion or pneumothorax.   Upper Abdomen: No acute abnormality. Hepatic steatosis. Simple, benign cyst of the central right lobe of the liver, for which no further follow-up or characterization is required.   Musculoskeletal: Incidental superficial lipoma overlying the left scapula (series 2, image 16). No acute osseous findings.   IMPRESSION: 1. Status post median sternotomy and aortic valve replacement. 2. Unchanged enlargement of the tubular  ascending thoracic aorta measuring up to 5.5 x 5.5 cm. The distal arch and descending thoracic aorta are normal in caliber. 3. Coronary artery disease. 4. Hepatic steatosis.   Aortic Atherosclerosis (ICD10-I70.0).     Electronically Signed   By: Delanna Ahmadi M.D.   On: 06/21/2022 09:32      Impression:  His ascending aortic aneurysm remains stable with a measurement of 5.5 x 5.5 cm today.  While this is at the usual surgical threshold for aortic replacement I think it is probably still best to continue following this given his multiple comorbid risk factors that would make redo sternotomy for ascending aortic replacement and possibly replacement of his aortic valve much higher risk.  I reviewed the CT images with him and his wife and answered their questions.  I stressed the importance of continued good blood pressure control in preventing further enlargement and acute aortic dissection.  I advised him against doing any heavy lifting that may require a Valsalva maneuver and could suddenly raise his blood pressure to high levels.  Plan:  I will see him back in 1 year with a CT of the chest without contrast due to his stage III chronic kidney disease..  I spent 20 minutes performing this established patient evaluation and > 50% of this time was spent face to face counseling and coordinating the care of this patient's aortic aneurysm.    Gaye Pollack, MD [facility] [phone]

## 2022-06-27 ENCOUNTER — Encounter: Payer: Self-pay | Admitting: Nurse Practitioner

## 2022-06-27 ENCOUNTER — Telehealth (INDEPENDENT_AMBULATORY_CARE_PROVIDER_SITE_OTHER): Payer: Medicare Other | Admitting: Nurse Practitioner

## 2022-06-27 VITALS — BP 101/70 | HR 78 | Temp 97.7°F | Wt 306.0 lb

## 2022-06-27 DIAGNOSIS — R051 Acute cough: Secondary | ICD-10-CM | POA: Diagnosis not present

## 2022-06-27 MED ORDER — AZITHROMYCIN 250 MG PO TABS: ORAL_TABLET | ORAL | 0 refills | Status: DC

## 2022-06-27 MED ORDER — MOLNUPIRAVIR EUA 200MG CAPSULE: 4.0000 | ORAL_CAPSULE | Freq: Two times a day (BID) | ORAL | 0 refills | Status: DC

## 2022-06-27 NOTE — Progress Notes (Signed)
Covid home testing positive -- they called to alert provider.  Stop Azithromycin and start Molnupiravir.  Educated then on this medication and use.

## 2022-06-27 NOTE — Progress Notes (Signed)
BP 101/70 (BP Location: Left Arm)   Pulse 78   Temp 97.7 F (36.5 C) (Temporal)   Wt (!) 306 lb (138.8 kg)   SpO2 97% Comment: RA  BMI 49.39 kg/m    Subjective:    Patient ID: Roberto [name], male    DOB: March 13, 1956, 66 y.o.   MRN: [id number]  HPI: Roberto [name] is a 66 y.o. male  Chief Complaint  Patient presents with   Cough    Tight chest, tight head, sore throat and fatigue, started yesterday   This visit was completed via video visit through MyChart due to the restrictions of the COVID-19 pandemic. All issues as above were discussed and addressed. Physical exam was done as above through visual confirmation on video through MyChart. If it was felt that the patient should be evaluated in the office, they were directed there. The patient verbally consented to this visit. Location of the patient: home Location of the provider: work Those involved with this call:  Provider: Marnee Guarneri, DNP CMA: Frazier Butt, [place] Desk/Registration: [employer]  Time spent on call:  21 minutes with patient face to face via video conference. More than 50% of this time was spent in counseling and coordination of care. 15 minutes total spent in review of patient's record and preparation of their chart.  I verified patient identity using two factors (patient name and date of birth). Patient consents verbally to being seen via telemedicine visit today.    UPPER RESPIRATORY TRACT INFECTION Started out with symptoms in past 48 hours -- sore throat, stuffy nose, aching, nasal drainage, + coughing.  Recent INR 2.5 at home.  Has had flu vaccine.  Received initial Covid vaccines.  He is concerned for exacerbation.  Has taken Zpack before with benefit. Fever: no -- is having chills Cough: yes Shortness of breath: yes Wheezing: yes Chest pain: no Chest tightness: yes Chest congestion: yes Nasal congestion: yes Runny nose: yes Post nasal drip: yes Sneezing: no Sore throat: yes Swollen  glands: no Sinus pressure: no Headache: yes Face pain: no Toothache: no Ear pain: none Ear pressure: none Eyes red/itching:no Eye drainage/crusting: no  Vomiting: no Rash: no Fatigue: yes Sick contacts: no Strep contacts: no  Context: fluctuating Recurrent sinusitis: no Relief with OTC cold/cough medications: yes  Treatments attempted: Coricidin, cough drops    Relevant past medical, surgical, family and social history reviewed and updated as indicated. Interim medical history since our last visit reviewed. Allergies and medications reviewed and updated.  Review of Systems  Constitutional:  Positive for fatigue. Negative for activity change, appetite change, chills, diaphoresis and fever.  HENT:  Positive for congestion, postnasal drip, rhinorrhea and sore throat. Negative for ear discharge, ear pain, sinus pressure, sinus pain, sneezing and voice change.   Respiratory:  Positive for cough, chest tightness, shortness of breath and wheezing.   Cardiovascular:  Negative for chest pain, palpitations and leg swelling.  Gastrointestinal: Negative.   Neurological:  Positive for headaches.  Psychiatric/Behavioral: Negative.      Per HPI unless specifically indicated above     Objective:    BP 101/70 (BP Location: Left Arm)   Pulse 78   Temp 97.7 F (36.5 C) (Temporal)   Wt (!) 306 lb (138.8 kg)   SpO2 97% Comment: RA  BMI 49.39 kg/m   Wt Readings from Last 3 Encounters:  06/27/22 (!) 306 lb (138.8 kg)  06/21/22 (!) 306 lb (138.8 kg)  06/14/22 (!) 308 lb (139.7 kg)  Physical Exam Vitals and nursing note reviewed.  Constitutional:      General: He is awake. He is not in acute distress.    Appearance: He is well-developed and well-groomed. He is obese. He is ill-appearing. He is not toxic-appearing.  HENT:     Head: Normocephalic.     Right Ear: Hearing normal. No drainage.     Left Ear: Hearing normal. No drainage.  Eyes:     General: Lids are normal.         Right eye: No discharge.        Left eye: No discharge.     Conjunctiva/sclera: Conjunctivae normal.  Pulmonary:     Effort: Pulmonary effort is normal. No accessory muscle usage or respiratory distress.  Musculoskeletal:     Cervical back: Normal range of motion.  Neurological:     Mental Status: He is alert and oriented to person, place, and time.  Psychiatric:        Mood and Affect: Mood normal.        Behavior: Behavior normal. Behavior is cooperative.        Thought Content: Thought content normal.        Judgment: Judgment normal.    Results for orders placed or performed in visit on 06/21/22  Protime-INR  Result Value Ref Range   INR 2.80 (A) 0.80 - 1.20      Assessment & Plan:   Problem List Items Addressed This Visit       Other   Acute cough - Primary    Acute with underlying COPD, concern for exacerbation.  Is Covid testing at home and will alert provider to result.  Plan for flu testing at office tomorrow.  Due to higher risk for exacerbation at this time will start [place] which has offered benefit before, he is aware to monitor INR closely with this and if elevation alert provider immediately to adjust Warfarin.  Recommend: - Increased rest - Increasing Fluids - Acetaminophen as needed for fever/pain.  - Salt water gargling, chloraseptic spray and throat lozenges - OTC Coricidin only  - Diabetic Tussin - Monitor INR closely - Use Albuterol nebulizer Q6H - Humidifying the air.       Relevant Orders   Veritor Flu A/B Waived    I discussed the assessment and treatment plan with the patient. The patient was provided an opportunity to ask questions and all were answered. The patient agreed with the plan and demonstrated an understanding of the instructions.   The patient was advised to call back or seek an in-person evaluation if the symptoms worsen or if the condition fails to improve as anticipated.   I provided 21+ minutes of time during this encounter.     Follow up plan: Return if symptoms worsen or fail to improve.

## 2022-06-27 NOTE — Assessment & Plan Note (Signed)
Acute with underlying COPD, concern for exacerbation.  Is Covid testing at home and will alert provider to result.  Plan for flu testing at office tomorrow.  Due to higher risk for exacerbation at this time will start [place] which has offered benefit before, he is aware to monitor INR closely with this and if elevation alert provider immediately to adjust Warfarin.  Recommend: - Increased rest - Increasing Fluids - Acetaminophen as needed for fever/pain.  - Salt water gargling, chloraseptic spray and throat lozenges - OTC Coricidin only  - Diabetic Tussin - Monitor INR closely - Use Albuterol nebulizer Q6H - Humidifying the air.

## 2022-06-27 NOTE — Patient Instructions (Signed)

## 2022-06-27 NOTE — Addendum Note (Signed)
Addended by: Marnee Guarneri T on: 06/27/2022 04:42 PM   Modules accepted: Orders

## 2022-06-28 ENCOUNTER — Telehealth: Payer: Self-pay | Admitting: Family Medicine

## 2022-06-28 DIAGNOSIS — Z952 Presence of prosthetic heart valve: Secondary | ICD-10-CM | POA: Diagnosis not present

## 2022-06-28 DIAGNOSIS — Z7901 Long term (current) use of anticoagulants: Secondary | ICD-10-CM | POA: Diagnosis not present

## 2022-06-28 LAB — PROTIME-INR: INR: 2.5 — AB (ref 0.80–1.20)

## 2022-06-28 MED ORDER — AZITHROMYCIN 250 MG PO TABS: ORAL_TABLET | ORAL | 0 refills | Status: DC

## 2022-06-28 NOTE — Telephone Encounter (Signed)
Patient's wife states that husband can not take the Molnupiravir 800 mg Oral 2 times daily800 mg Oral 2 times daily because Insurance will not pay for it, however they will pay for the Paxlovid. Patient is unable to take Paxlovid because of interactions to medications that he is currently taking. Wife wants to know what he can take now.

## 2022-06-28 NOTE — Telephone Encounter (Signed)
Patient made aware and verbalized understanding,   Can Azithromycin be sent to the [facility] in [place] instead of the [facility]

## 2022-06-28 NOTE — Telephone Encounter (Signed)
Copied from [place] [id number]. Topic: General - Other >> Jun 28, 2022 10:46 AM Sabas Sous wrote: Reason for CRM: Vangie Bicker called requesting a call back with updates on request  Best contact:  [phone]  Says patient is not doing well and she is concerned

## 2022-06-29 MED ORDER — AZITHROMYCIN 250 MG PO TABS: ORAL_TABLET | ORAL | 0 refills | Status: AC

## 2022-06-29 NOTE — Addendum Note (Signed)
Addended by: Valerie Roys on: 06/29/2022 08:27 AM   Modules accepted: Orders

## 2022-06-30 ENCOUNTER — Ambulatory Visit (INDEPENDENT_AMBULATORY_CARE_PROVIDER_SITE_OTHER): Payer: Medicare Other | Admitting: Family Medicine

## 2022-06-30 DIAGNOSIS — Z952 Presence of prosthetic heart valve: Secondary | ICD-10-CM | POA: Diagnosis not present

## 2022-07-06 ENCOUNTER — Encounter: Payer: Self-pay | Admitting: Physician Assistant

## 2022-07-06 ENCOUNTER — Telehealth (INDEPENDENT_AMBULATORY_CARE_PROVIDER_SITE_OTHER): Payer: Medicare Other | Admitting: Physician Assistant

## 2022-07-06 VITALS — BP 108/74 | HR 76 | Temp 96.5°F | Wt 304.0 lb

## 2022-07-06 DIAGNOSIS — U071 COVID-19: Secondary | ICD-10-CM

## 2022-07-06 LAB — POCT INR: INR: 2.6 (ref 2.0–3.0)

## 2022-07-06 NOTE — Progress Notes (Deleted)
Established Patient Office Visit  Name: Roberto [name]   MRN: [id number]    DOB: [birth year]   Date:07/06/2022  Today's Provider: Talitha Givens, MHS, [name] Introduced myself to the patient as a [name] and provided education on APPs in clinical practice.         Subjective  Chief Complaint  No chief complaint on file.   HPI   Patient Active Problem List   Diagnosis Date Noted   Acute cough 06/27/2022   Tremor of left hand 05/29/2022   Aneurysm of ascending aorta without rupture ([place]) 02/17/2022   Anticoagulated on Coumadin 07/29/2021   Chronic kidney disease, stage 3a ([place]) 12/09/2020   Hyperlipidemia associated with type 2 diabetes mellitus ([place]) 12/09/2020   Coronary artery disease involving native coronary artery of native heart without angina pectoris 09/10/2019   Osteoarthritis of spine with radiculopathy, lumbar region 11/21/2018   History of aortic stenosis 06/05/2018   Chronic gout without tophus 04/03/2018   Valvular heart disease 01/17/2018   Morbid obesity ([place]) 01/17/2018   Chronic HFrEF (heart failure with reduced ejection fraction) ([place]) 09/27/2016   Renal infarct ([place]) 05/01/2016   Type 2 diabetes mellitus with cardiac complication ([place]) 50/35/4656   Chronic bilateral low back pain without sciatica 04/19/2016   Thoracic aortic aneurysm without rupture ([place]) 03/16/2016   Aortic atherosclerosis ([place]) 03/16/2016   Nicotine dependence, cigarettes, uncomplicated 81/27/5170   NICM (nonischemic cardiomyopathy) ([place]) 03/02/2016   COPD (chronic obstructive pulmonary disease) ([place]) 09/09/2015   H/O mechanical aortic valve replacement 02/09/2015   Hypertension associated with diabetes ([place]) 02/09/2015    Past Surgical History:  Procedure Laterality Date   AORTIC VALVE REPLACEMENT     CARDIAC CATHETERIZATION  03/21/2010   No significant CAD. Severe aortic stenosis. Severely elevated left and right heart filling pressures.   CARDIAC SURGERY  2009   CHF   CARPAL  TUNNEL RELEASE Left 2005   RIGHT HEART CATH AND CORONARY ANGIOGRAPHY N/A 01/28/2019   Procedure: RIGHT HEART CATH AND CORONARY ANGIOGRAPHY;  Surgeon: Nelva Bush, MD;  Location: [facility];  Service: Cardiovascular;  Laterality: N/A;   TONSILLECTOMY  1962    Family History  Problem Relation Age of Onset   Arthritis Mother    Dementia Mother    Colon cancer Mother    Arthritis Father    Diabetes Father    Stroke Father    Colon cancer Father    Heart attack Brother    Breast cancer Sister    Seizures Sister    Cancer Brother        brain   Heart disease Brother    Heart attack Brother     Social History   Tobacco Use   Smoking status: Former    Packs/day: 0.50    Years: 46.00    Total pack years: 23.00    Types: Cigarettes    Start date: 07/16/2017    Quit date: 05/11/2019    Years since quitting: 3.1   Smokeless tobacco: Never  Substance Use Topics   Alcohol use: No    Alcohol/week: 0.0 standard drinks of alcohol     Current Outpatient Medications:    albuterol (PROVENTIL) (2.5 MG/3ML) 0.083% nebulizer solution, USE 1 VIAL IN NEBULIZER EVERY 6 HOURS AS NEEDED FOR WHEEZING FOR SHORTNESS OF BREATH, Disp: 180 mL, Rfl: 1   albuterol (VENTOLIN HFA) 108 (90 Base) MCG/ACT inhaler, INHALE 2 PUFFS EVERY 6 HOURS AS NEEDED FOR WHEEZING FOR SHORTNESS  OF BREATH, Disp: 54 g, Rfl: 6   allopurinol (ZYLOPRIM) 300 MG tablet, Take 1 tablet by mouth once daily, Disp: 90 tablet, Rfl: 1   aspirin 81 MG chewable tablet, Chew 81 mg by mouth daily., Disp: , Rfl:    Budeson-Glycopyrrol-Formoterol (BREZTRI AEROSPHERE) 160-9-4.8 MCG/ACT AERO, Inhale 2 puffs into the lungs 2 (two) times daily., Disp: 10.7 g, Rfl: 11   cyclobenzaprine (FLEXERIL) 10 MG tablet, Take 1 tablet (10 mg total) by mouth 3 times/day as needed-between meals & bedtime for muscle spasms. (Patient not taking: Reported on 06/27/2022), Disp: 180 tablet, Rfl: 0   dapagliflozin propanediol (FARXIGA) 10 MG TABS tablet,  Take 1 tablet (10 mg total) by mouth daily., Disp: 90 tablet, Rfl: 3   EQ ALLERGY RELIEF, CETIRIZINE, 10 MG tablet, Take 1 tablet by mouth once daily, Disp: 90 tablet, Rfl: 0   fenofibrate (TRICOR) 48 MG tablet, Take 1 tablet (48 mg total) by mouth daily., Disp: 90 tablet, Rfl: 3   furosemide (LASIX) 40 MG tablet, TAKE 2 TABLETS BY MOUTH IN THE MORNING AND 1 TABLET IN THE EVENING, Disp: 270 tablet, Rfl: 0   lisinopril (ZESTRIL) 2.5 MG tablet, Take 1 tablet (2.5 mg total) by mouth daily., Disp: 90 tablet, Rfl: 3   metFORMIN (GLUCOPHAGE-XR) 500 MG 24 hr tablet, Take 1 tablet (500 mg total) by mouth daily with breakfast., Disp: 90 tablet, Rfl: 1   metoprolol succinate (TOPROL-XL) 100 MG 24 hr tablet, TAKE 1 TABLET BY MOUTH ONCE DAILY WITH  OR  IMMEDIATELY  FOLLOWING  A  MEAL, Disp: 90 tablet, Rfl: 3   nortriptyline (PAMELOR) 10 MG capsule, Take 1 capsule (10 mg total) by mouth at bedtime., Disp: 90 capsule, Rfl: 1   Omega-3 Fatty Acids (FISH OIL) 1000 MG CAPS, Take 5,000 mg by mouth daily., Disp: , Rfl:    potassium chloride SA (KLOR-CON M) 20 MEQ tablet, Take 1 tablet (20 mEq total) by mouth daily., Disp: 90 tablet, Rfl: 3   rosuvastatin (CRESTOR) 40 MG tablet, Take 1 tablet (40 mg total) by mouth daily., Disp: 90 tablet, Rfl: 3   Semaglutide, 1 MG/DOSE, 4 MG/3ML SOPN, Inject 1 mg as directed once a week., Disp: 3 mL, Rfl: 6   TRELEGY ELLIPTA 100-62.5-25 MCG/ACT AEPB, Inhale 1 puff into the lungs daily., Disp: 3 each, Rfl: 3   warfarin (COUMADIN) 4 MG tablet, TAKE 1 TABLET BY MOUTH ONCE DAILY AT 6PM AS DIRECTED, Disp: 180 tablet, Rfl: 3   warfarin (COUMADIN) 7.5 MG tablet, TAKE 4MG BY MOUTH FOR 2 DAYS, THEN 7.5MG ON THE THIRD DAY, THEN REPEAT., Disp: 90 tablet, Rfl: 1  No Known Allergies  I personally reviewed {Reviewed:14835} with the patient/caregiver today.   ROS    Objective  There were no vitals filed for this visit.  There is no height or weight on file to calculate BMI.  Physical  Exam   Recent Results (from the past 2160 hour(s))  Protime-INR     Status: Abnormal   Collection Time: 04/12/22 12:00 AM  Result Value Ref Range   INR 3.30 (A) 0.80 - 1.20  Protime-INR     Status: Abnormal   Collection Time: 04/20/22 12:00 AM  Result Value Ref Range   INR 3.20 (A) 0.80 - 1.20  Protime-INR     Status: Abnormal   Collection Time: 04/20/22 12:00 AM  Result Value Ref Range   INR 3.20 (A) 0.80 - 1.20  Protime-INR     Status: Abnormal   Collection Time:  04/20/22 12:00 AM  Result Value Ref Range   INR 3.20 (A) 0.80 - 1.20  ECHOCARDIOGRAM COMPLETE     Status: None   Collection Time: 04/28/22  8:25 AM  Result Value Ref Range   AR max vel 2.18 cm2   AV Peak grad 20.7 mmHg   Ao pk vel 2.28 m/s   S' Lateral 4.40 cm   Area-P 1/2 5.58 cm2   AV Area VTI 2.41 cm2   AV Mean grad 12.7 mmHg   AV Area mean vel 1.91 cm2  Protime-INR     Status: Abnormal   Collection Time: 05/02/22 12:00 AM  Result Value Ref Range   INR 3.00 (A) 0.80 - 7.10  Basic metabolic panel     Status: Abnormal   Collection Time: 05/02/22 10:52 AM  Result Value Ref Range   Glucose 102 (H) 70 - 99 mg/dL   BUN 33 (H) 8 - 27 mg/dL   Creatinine, Ser 1.37 (H) 0.76 - 1.27 mg/dL   eGFR 57 (L) >59 mL/min/1.73   BUN/Creatinine Ratio 24 10 - 24   Sodium 141 134 - 144 mmol/L   Potassium 4.5 3.5 - 5.2 mmol/L   Chloride 101 96 - 106 mmol/L   CO2 22 20 - 29 mmol/L   Calcium 10.4 (H) 8.6 - 10.2 mg/dL  Protime-INR     Status: Abnormal   Collection Time: 05/11/22 12:00 AM  Result Value Ref Range   INR 2.90 (A) 0.80 - 1.20  Protime-INR     Status: Abnormal   Collection Time: 06/01/22 12:00 AM  Result Value Ref Range   INR 2.90 (A) 0.80 - 1.20  Protime-INR     Status: Abnormal   Collection Time: 06/12/22 12:00 AM  Result Value Ref Range   INR 2.50 (A) 0.80 - 1.20  Protime-INR     Status: Abnormal   Collection Time: 06/20/22 12:00 AM  Result Value Ref Range   INR 2.80 (A) 0.80 - 1.20  Protime-INR      Status: Abnormal   Collection Time: 06/28/22 12:00 AM  Result Value Ref Range   INR 2.50 (A) 0.80 - 1.20     PHQ2/9:    05/29/2022    8:54 AM 05/02/2022   10:38 AM 03/31/2022    8:42 AM 12/23/2021    9:05 AM 07/21/2021   12:26 PM  Depression screen PHQ 2/9  Decreased Interest _0 0  Down, Depressed, Hopeless _1 0  PHQ - 2 Score _2 0  Altered sleeping 0 0 2 2   Tired, decreased energy _3 0   Change in appetite _4 0   Feeling bad or failure about yourself  _5 Trouble concentrating 0 0 1 0   Moving slowly or fidgety/restless 0 0 1    Suicidal thoughts 0 0 0 0   PHQ-9 Score _6 Difficult doing work/chores   Somewhat difficult Extremely dIfficult       Fall Risk:    03/31/2022    8:42 AM 12/23/2021    9:06 AM 07/21/2021   12:19 PM 05/11/2021   10:33 AM 10/25/2020   10:57 AM  Fall Risk   Falls in the past year? 1 1 0 0 0  Number falls in past yr: 1 1 0 0 0  Injury with Fall? 1 0 0 0 0  Risk for fall due to : History  of fall(s) No Fall Risks  No Fall Risks No Fall Risks  Follow up Falls evaluation completed Falls evaluation completed Falls evaluation completed;Falls prevention discussed Falls prevention discussed Falls evaluation completed      Functional Status Survey:      Assessment & Plan

## 2022-07-06 NOTE — Progress Notes (Signed)
Virtual Visit via Video Note  I connected with Roberto [name] on 07/06/22 at  9:00 AM EST by a video enabled telemedicine application and verified that I am speaking with the correct person using two identifiers.  Today's Provider: Talitha Givens, MHS, [name] Introduced myself to the patient as a [name] and provided education on APPs in clinical practice.   Location: Patient: at home  Provider: [place], [place]    I discussed the limitations of evaluation and management by telemedicine and the availability of in person appointments. The patient expressed understanding and agreed to proceed.   Chief Complaint  Patient presents with   Covid Positive     History of Present Illness:  COVID positive He is here with his caregiver who is assisting with HPI - she is very agitated and aggressive, citing miscommunication over medications and inability to get a script from office  Unable to obtain Molnupiravir due to insurance coverage and Paxlovid interacted with too many of his chronic medications  Was rx'd Z pack to assist with symptoms by another provider   Reports his symptoms are improving but has worse productive coughing and chills at night  He states he has not taken the [place] at all since he was told not to take and they discarded it  They were waiting on Paxlovid information or script   He has been taking Coricidin only   His caregiver states he is still testing COVID     Review of Systems  Constitutional:  Positive for chills. Negative for fever.  HENT:  Positive for congestion.   Respiratory:  Positive for cough and sputum production. Negative for shortness of breath and wheezing.       Observations/Objective:  Vitals obtained are patient reported   Today's Vitals   07/06/22 0916  BP: 108/74  Pulse: 76  Temp: (!) 96.5 F (35.8 C)  TempSrc: Oral  SpO2: 95%  Weight: (!) 304 lb (137.9 kg)   Body mass index is 49.07 kg/m.   Due to the  nature of the virtual visit, physical exam and observations are limited. Able to obtain the following observations:   Alert, oriented x 3  Appears comfortable, in no acute distress, intermittently watching TV during apt  No scleral injection, no appreciated hoarseness, tachypnea, wheeze or strider.  Able to maintain conversation without visible strain.  No cough appreciated during visit.     Assessment and Plan:   Problem List Items Addressed This Visit   None Visit Diagnoses     COVID-19    -  Primary Acute, ongoing concern, appears to be resolving Patient reports his symptoms are largely improved except for nighttime coughing and mucus production Attempted to review concerns regarding antivirals and Z-pack and explained that he is outside of antiviral window and Paxlovid interacts with too many of his medications to be a safe choice at this time  Reviewed symptomatic management- recommend stopping Coricidin at this time and trying Mucinex and Robitussin  Reviewed with him and caregiver that he can take regular formulations of both of these and to avoid those designated as decongestant formulations - reviewed UTD for confirmation of safety regarding heart concerns and reduced kidney function Recommend staying well hydrated and proceeding with symptomatic management at this time Follow up as needed for persistent or progressing symptoms       Follow Up Instructions:    I discussed the assessment and treatment plan with the patient. The patient was provided an opportunity  to ask questions and all were answered. The patient agreed with the plan and demonstrated an understanding of the instructions.   The patient was advised to call back or seek an in-person evaluation if the symptoms worsen or if the condition fails to improve as anticipated.  I provided 16 minutes of non-face-to-face time during this encounter.  No follow-ups on file.   I, Kellyann Ordway E Fynn Adel, [name], have reviewed  all documentation for this visit. The documentation on 07/06/22 for the exam, diagnosis, procedures, and orders are all accurate and complete.   Talitha Givens, MHS, [name] [facility]

## 2022-07-10 ENCOUNTER — Other Ambulatory Visit: Payer: Self-pay | Admitting: Family Medicine

## 2022-07-10 NOTE — Telephone Encounter (Signed)
Medication Refill - Medication: Semaglutide, 1 MG/DOSE, 4 MG/3ML SOPN   Has the patient contacted their pharmacy? Yes.   Referred to PCP.  Patient has only one dose left  Preferred Pharmacy (with phone number or street name):  [facility] [place] - [address] Phone: [phone]  Fax: [phone]     Has the patient been seen for an appointment in the last year OR does the patient have an upcoming appointment? Yes.    Agent: Please be advised that RX refills may take up to 3 business days. We ask that you follow-up with your pharmacy.

## 2022-07-11 MED ORDER — SEMAGLUTIDE (1 MG/DOSE) 4 MG/3ML ~~LOC~~ SOPN: 1.0000 mg | PEN_INJECTOR | SUBCUTANEOUS | 6 refills | Status: DC

## 2022-07-11 NOTE — Telephone Encounter (Signed)
Requested Prescriptions  Pending Prescriptions Disp Refills   Semaglutide, 1 MG/DOSE, 4 MG/3ML SOPN 3 mL 6    Sig: Inject 1 mg as directed once a week.     Endocrinology:  Diabetes - GLP-1 Receptor Agonists - semaglutide Failed - 07/10/2022  4:41 PM      Failed - HBA1C in normal range and within 180 days    Hemoglobin A1C  Date Value Ref Range Status  04/19/2016 6.6  Final   HB A1C (BAYER DCA - WAIVED)  Date Value Ref Range Status  03/31/2022 6.8 (H) 4.8 - 5.6 % Final    Comment:             Prediabetes: 5.7 - 6.4          Diabetes: >6.4          Glycemic control for adults with diabetes: <7.0          Failed - Cr in normal range and within 360 days    Creatinine  Date Value Ref Range Status  05/25/2014 0.85 0.60 - 1.30 mg/dL Final   Creatinine, Ser  Date Value Ref Range Status  05/02/2022 1.37 (H) 0.76 - 1.27 mg/dL Final         Passed - Valid encounter within last 6 months    Recent Outpatient Visits           5 days ago [place], [name]   2 weeks ago Acute cough   [facility], [place] T, NP   1 month ago Tremor of left hand   [facility] Mecum, Erin E, [name]   2 months ago Upper respiratory tract infection, unspecified type   [employer], Erin E, [name]   2 months ago Hypertension associated with diabetes ([place])   [place], Barb Merino, DO       Future Appointments             In 2 months End, Harrell Gave, MD [place]. [place]   In 2 months Wynetta Emery, Barb Merino, DO [employer]

## 2022-07-17 LAB — PROTIME-INR: INR: 3 — AB (ref 0.80–1.20)

## 2022-07-18 ENCOUNTER — Other Ambulatory Visit: Payer: Self-pay | Admitting: Internal Medicine

## 2022-07-18 ENCOUNTER — Other Ambulatory Visit: Payer: Self-pay | Admitting: Family Medicine

## 2022-07-19 ENCOUNTER — Telehealth: Payer: Self-pay | Admitting: Family Medicine

## 2022-07-19 NOTE — Telephone Encounter (Signed)
Copied from [place] [id number]. Topic: Medicare AWV >> Jul 19, 2022  9:56 AM Josephina Gip wrote: Reason for CRM: Left message for patient to call back and schedule the Medicare Annual Wellness Visit (AWV) virtually or by telephone.  Last AWV 07/21/21  Please schedule at anytime with [facility]-Nurse Health Advisor.  30 minute appointment  Any questions, please call me at [phone]

## 2022-07-19 NOTE — Telephone Encounter (Signed)
Requested Prescriptions  Pending Prescriptions Disp Refills   metFORMIN (GLUCOPHAGE-XR) 500 MG 24 hr tablet [Pharmacy Med Name: metFORMIN HCl ER 500 MG Oral Tablet Extended Release 24 Hour] 90 tablet 0    Sig: Take 1 tablet by mouth once daily with breakfast     Endocrinology:  Diabetes - Biguanides Failed - 07/18/2022  7:21 PM      Failed - Cr in normal range and within 360 days    Creatinine  Date Value Ref Range Status  05/25/2014 0.85 0.60 - 1.30 mg/dL Final   Creatinine, Ser  Date Value Ref Range Status  05/02/2022 1.37 (H) 0.76 - 1.27 mg/dL Final         Failed - eGFR in normal range and within 360 days    EGFR (African American)  Date Value Ref Range Status  05/25/2014 >60 >30m/min Final   GFR calc Af Amer  Date Value Ref Range Status  05/12/2020 64 >59 mL/min/1.73 Final    Comment:    **In accordance with recommendations from the NKF-ASN Task force,**   [facility] is in the process of updating its eGFR calculation to the   2021 CKD-EPI creatinine equation that estimates kidney function   without a race variable.    EGFR (Non-African Amer.)  Date Value Ref Range Status  05/25/2014 >60 >657mmin Final    Comment:    eGFR values <6020min/1.73 m2 may be an indication of chronic kidney disease (CKD). Calculated eGFR, using the MRDR Study equation, is useful in  patients with stable renal function. The eGFR calculation will not be reliable in acutely ill patients when serum creatinine is changing rapidly. It is not useful in patients on dialysis. The eGFR calculation may not be applicable to patients at the low and high extremes of body sizes, pregnant women, and vegetarians.    GFR, Estimated  Date Value Ref Range Status  08/26/2020 51 (L) >60 mL/min Final    Comment:    (NOTE) Calculated using the CKD-EPI Creatinine Equation (2021)    eGFR  Date Value Ref Range Status  05/02/2022 57 (L) >59 mL/min/1.73 Final         Failed - B12 Level in normal range and  within 720 days    No results found for: "VITAMINB12"       Passed - HBA1C is between 0 and 7.9 and within 180 days    Hemoglobin A1C  Date Value Ref Range Status  04/19/2016 6.6  Final   HB A1C (BAYER DCA - WAIVED)  Date Value Ref Range Status  03/31/2022 6.8 (H) 4.8 - 5.6 % Final    Comment:             Prediabetes: 5.7 - 6.4          Diabetes: >6.4          Glycemic control for adults with diabetes: <7.0          Passed - Valid encounter within last 6 months    Recent Outpatient Visits           1 week ago [facility]   3 weeks ago Acute cough   [facility]   1 month ago Tremor of left hand   [facility] Mecum, Erin E, [name]   2 months ago Upper respiratory tract infection, unspecified type   [name]   2 months ago Hypertension  associated with diabetes [facility])   [facility] Valerie Roys, DO       Future Appointments             In 1 month End, Harrell Gave, MD [place]   In 2 months [name], [name], DO [facility], [facility]            Passed - CBC within normal limits and completed in the last 12 months    WBC  Date Value Ref Range Status  12/23/2021 7.6 3.4 - 10.8 x10E3/uL Final  02/20/2019 8.2 4.0 - 10.5 K/uL Final   RBC  Date Value Ref Range Status  12/23/2021 4.95 4.14 - 5.80 x10E6/uL Final  02/20/2019 4.73 4.22 - 5.81 MIL/uL Final   Hemoglobin  Date Value Ref Range Status  12/23/2021 14.1 13.0 - 17.7 g/dL Final   Hematocrit  Date Value Ref Range Status  12/23/2021 42.9 37.5 - 51.0 % Final   MCHC  Date Value Ref Range Status  12/23/2021 32.9 31.5 - 35.7 g/dL Final  02/20/2019 32.1 30.0 - 36.0 g/dL Final   [facility]  Date Value Ref Range Status  12/23/2021 28.5 26.6 - 33.0 pg Final  02/20/2019 29.8 26.0 - 34.0 pg Final   MCV  Date Value  Ref Range Status  12/23/2021 87 79 - 97 fL Final  05/25/2014 88 80 - 100 fL Final   No results found for: "PLTCOUNTKUC", "LABPLAT", "POCPLA" RDW  Date Value Ref Range Status  12/23/2021 13.6 11.6 - 15.4 % Final  05/25/2014 13.9 11.5 - 14.5 % Final

## 2022-07-20 ENCOUNTER — Ambulatory Visit: Payer: Self-pay | Admitting: Family Medicine

## 2022-07-20 DIAGNOSIS — Z952 Presence of prosthetic heart valve: Secondary | ICD-10-CM

## 2022-07-25 LAB — PROTIME-INR: INR: 2.8 — AB (ref 0.80–1.20)

## 2022-07-31 ENCOUNTER — Other Ambulatory Visit: Payer: Self-pay | Admitting: Neurology

## 2022-07-31 DIAGNOSIS — R251 Tremor, unspecified: Secondary | ICD-10-CM

## 2022-08-01 ENCOUNTER — Ambulatory Visit (INDEPENDENT_AMBULATORY_CARE_PROVIDER_SITE_OTHER): Payer: Medicare Other | Admitting: Family Medicine

## 2022-08-01 DIAGNOSIS — Z952 Presence of prosthetic heart valve: Secondary | ICD-10-CM | POA: Diagnosis not present

## 2022-08-02 ENCOUNTER — Ambulatory Visit (INDEPENDENT_AMBULATORY_CARE_PROVIDER_SITE_OTHER): Payer: Medicare Other | Admitting: Family Medicine

## 2022-08-02 DIAGNOSIS — Z952 Presence of prosthetic heart valve: Secondary | ICD-10-CM

## 2022-08-02 LAB — PROTIME-INR: INR: 3 — AB (ref 0.80–1.20)

## 2022-08-04 ENCOUNTER — Ambulatory Visit: Payer: Medicare Other | Admitting: Internal Medicine

## 2022-08-10 LAB — PROTIME-INR: INR: 3 — AB (ref 0.80–1.20)

## 2022-08-11 ENCOUNTER — Ambulatory Visit
Admission: RE | Admit: 2022-08-11 | Discharge: 2022-08-11 | Disposition: A | Discharge status: Home / Self Care | Payer: 59 | Source: Ambulatory Visit | Attending: Neurology | Admitting: Neurology

## 2022-08-11 DIAGNOSIS — R251 Tremor, unspecified: Secondary | ICD-10-CM

## 2022-08-17 ENCOUNTER — Ambulatory Visit (INDEPENDENT_AMBULATORY_CARE_PROVIDER_SITE_OTHER): Payer: 59 | Admitting: Family Medicine

## 2022-08-17 DIAGNOSIS — Z952 Presence of prosthetic heart valve: Secondary | ICD-10-CM | POA: Diagnosis not present

## 2022-08-17 NOTE — Progress Notes (Signed)
Anticoag

## 2022-08-18 LAB — PROTIME-INR: INR: 2.8 — AB (ref 0.80–1.20)

## 2022-08-21 ENCOUNTER — Other Ambulatory Visit: Payer: Self-pay | Admitting: Family Medicine

## 2022-08-21 ENCOUNTER — Ambulatory Visit (INDEPENDENT_AMBULATORY_CARE_PROVIDER_SITE_OTHER): Payer: 59

## 2022-08-21 VITALS — Ht 66.0 in | Wt 306.0 lb

## 2022-08-21 DIAGNOSIS — Z0000 Encounter for general adult medical examination without abnormal findings: Secondary | ICD-10-CM

## 2022-08-21 NOTE — Progress Notes (Signed)
I connected with  Roberto [name] on 08/21/22 by a audio enabled telemedicine application and verified that I am speaking with the correct person using two identifiers.  Patient Location: Home  Provider Location: Home Office  I discussed the limitations of evaluation and management by telemedicine. The patient expressed understanding and agreed to proceed.  Subjective:   Roberto [name] is a 67 y.o. male who presents for Medicare Annual/Subsequent preventive examination.  Review of Systems     Cardiac Risk Factors include: advanced age (>3mn, >>41women);diabetes mellitus;male gender;hypertension     Objective:    There were no vitals filed for this visit. There is no height or weight on file to calculate BMI.     08/21/2022    8:51 AM 07/21/2021   12:30 PM 10/17/2020    9:59 AM 07/16/2020    9:47 AM 09/01/2019    9:47 AM 07/14/2019    9:53 AM 01/28/2019    7:21 AM  Advanced Directives  Does Patient Have a Medical Advance Directive? No No No No No No No  Would patient like information on creating a medical advance directive? No - Patient declined No - Patient declined No - Patient declined  No - Patient declined  No - Patient declined    Current Medications (verified) Outpatient Encounter Medications as of 08/21/2022  Medication Sig   albuterol (PROVENTIL) (2.5 MG/3ML) 0.083% nebulizer solution USE 1 VIAL IN NEBULIZER EVERY 6 HOURS AS NEEDED FOR WHEEZING FOR SHORTNESS OF BREATH   albuterol (VENTOLIN HFA) 108 (90 Base) MCG/ACT inhaler INHALE 2 PUFFS EVERY 6 HOURS AS NEEDED FOR WHEEZING FOR SHORTNESS OF BREATH   allopurinol (ZYLOPRIM) 300 MG tablet Take 1 tablet by mouth once daily   aspirin 81 MG chewable tablet Chew 81 mg by mouth daily.   cyclobenzaprine (FLEXERIL) 10 MG tablet Take 1 tablet (10 mg total) by mouth 3 times/day as needed-between meals & bedtime for muscle spasms.   dapagliflozin propanediol (FARXIGA) 10 MG TABS tablet Take 1 tablet (10 mg total) by mouth daily.    EQ ALLERGY RELIEF, CETIRIZINE, 10 MG tablet Take 1 tablet by mouth once daily   fenofibrate (TRICOR) 48 MG tablet Take 1 tablet (48 mg total) by mouth daily.   furosemide (LASIX) 40 MG tablet TAKE 2 TABLETS BY MOUTH IN THE MORNING AND 1 TABLET IN THE EVENING   lisinopril (ZESTRIL) 2.5 MG tablet Take 1 tablet (2.5 mg total) by mouth daily.   metFORMIN (GLUCOPHAGE-XR) 500 MG 24 hr tablet Take 1 tablet by mouth once daily with breakfast   metoprolol succinate (TOPROL-XL) 100 MG 24 hr tablet TAKE 1 TABLET BY MOUTH ONCE DAILY WITH  OR  IMMEDIATELY  FOLLOWING  A  MEAL   nortriptyline (PAMELOR) 10 MG capsule Take 1 capsule (10 mg total) by mouth at bedtime.   Omega-3 Fatty Acids (FISH OIL) 1000 MG CAPS Take 5,000 mg by mouth daily.   potassium chloride SA (KLOR-CON M) 20 MEQ tablet Take 1 tablet (20 mEq total) by mouth daily.   rosuvastatin (CRESTOR) 40 MG tablet Take 1 tablet (40 mg total) by mouth daily.   Semaglutide, 1 MG/DOSE, 4 MG/3ML SOPN Inject 1 mg as directed once a week.   TRELEGY ELLIPTA 100-62.5-25 MCG/ACT AEPB Inhale 1 puff into the lungs daily.   warfarin (COUMADIN) 4 MG tablet TAKE 1 TABLET BY MOUTH ONCE DAILY AT 6PM AS DIRECTED   warfarin (COUMADIN) 7.5 MG tablet TAKE 4MG BY MOUTH FOR 2 DAYS, THEN 7.5MG ON THE THIRD  DAY, THEN REPEAT.   Budeson-Glycopyrrol-Formoterol (BREZTRI AEROSPHERE) 160-9-4.8 MCG/ACT AERO Inhale 2 puffs into the lungs 2 (two) times daily. (Patient not taking: Reported on 08/21/2022)   No facility-administered encounter medications on file as of 08/21/2022.    Allergies (verified) Patient has no known allergies.   History: Past Medical History:  Diagnosis Date   Bicuspid aortic valve    a. s/p #27 Carbomedics mechanical valve on 03/25/2010; b. on Coumadin; c. TTE 12/17: EF 40-45%, moderately dilated LV with moderate LVH, AVR well-seated with 14 mmHg gradient, peak AV velocity 2.5 m/s, mild mitral valve thickening with mild MR, mildly dilated RV with mildly reduced  contraction   Cellulitis    Chronic kidney disease    Chronic systolic CHF (congestive heart failure) ([place])    a. R/LHC 03/2010 showed no significant CAD, LVEDP 31 mmHg, mean AoV gradient 34 mmHg at rest and 47 mmHg with dobutamine 20 mcg/kg/min, AVA 1.0 cm^2, RA 31, RV 68/25, PA 68/47, PCWP 38. PA sat 65%. CO 6.2 L/min ([place]) and 5.3 L/min (thermodilution)   Clotting disorder (HCC)    H/O mechanical aortic valve replacement 03/25/2010   a. #27 Carbomedics mechanical valve   Hypercholesterolemia    Renal infarct [facility]) 2017   Multiple right renal infarcts, likely embolic.   Stroke [facility])    TIA (transient ischemic attack) 05/2014   Past Surgical History:  Procedure Laterality Date   AORTIC VALVE REPLACEMENT     CARDIAC CATHETERIZATION  03/21/2010   No significant CAD. Severe aortic stenosis. Severely elevated left and right heart filling pressures.   CARDIAC SURGERY  2009   CHF   CARPAL TUNNEL RELEASE Left 2005   RIGHT HEART CATH AND CORONARY ANGIOGRAPHY N/A 01/28/2019   Procedure: RIGHT HEART CATH AND CORONARY ANGIOGRAPHY;  Surgeon: Nelva Bush, MD;  Location: [place] [facility];  Service: Cardiovascular;  Laterality: N/A;   TONSILLECTOMY  1962   Family History  Problem Relation Age of Onset   Arthritis Mother    Dementia Mother    Colon cancer Mother    Arthritis Father    Diabetes Father    Stroke Father    Colon cancer Father    Heart attack Brother    Breast cancer Sister    Seizures Sister    Cancer Brother        brain   Heart disease Brother    Heart attack Brother    Social History   Socioeconomic History   Marital status: Single    Spouse name: Not on file   Number of children: 1   Years of education: 4   Highest education level: Associate degree: academic program  Occupational History   Occupation: Disabled    Employer: UNEMPLOYED  Tobacco Use   Smoking status: Former    Packs/day: 0.50    Years: 46.00    Total pack years: 23.00    Types:  Cigarettes    Start date: 07/16/2017    Quit date: 05/11/2019    Years since quitting: 3.2   Smokeless tobacco: Never  Vaping Use   Vaping Use: Never used  Substance and Sexual Activity   Alcohol use: No    Alcohol/week: 0.0 standard drinks of alcohol   Drug use: No   Sexual activity: Not Currently  Other Topics Concern   Not on file  Social History Narrative   Not on file   Social Determinants of Health   Financial Resource Strain: Low Risk  (08/21/2022)   Overall Financial  Resource Strain (CARDIA)    Difficulty of Paying Living Expenses: Not very hard  Food Insecurity: No Food Insecurity (08/21/2022)   Hunger Vital Sign    Worried About Running Out of Food in the Last Year: Never true    Ran Out of Food in the Last Year: Never true  Transportation Needs: No Transportation Needs (08/21/2022)   PRAPARE - Hydrologist (Medical): No    Lack of Transportation (Non-Medical): No  Physical Activity: Inactive (08/21/2022)   Exercise Vital Sign    Days of Exercise per Week: 0 days    Minutes of Exercise per Session: 0 min  Stress: No Stress Concern Present (08/21/2022)   [place]    Feeling of Stress : Only a little  Social Connections: Socially Isolated (08/21/2022)   Social Connection and Isolation Panel [NHANES]    Frequency of Communication with Friends and Family: More than three times a week    Frequency of Social Gatherings with Friends and Family: Never    Attends Religious Services: Never    Printmaker: No    Attends Music therapist: Never    Marital Status: Divorced    Tobacco Counseling Counseling given: Not Answered   Clinical Intake:  Pre-visit preparation completed: Yes  Pain : No/denies pain     Nutritional Risks: None Diabetes: Yes  How often do you need to have someone help you when you read instructions, pamphlets,  or other written materials from your doctor or pharmacy?: 1 - Never  Diabetic?yes Nutrition Risk Assessment:  Has the patient had any N/V/D within the last 2 months?  No  Does the patient have any non-healing wounds?  No  Has the patient had any unintentional weight loss or weight gain?  No   Diabetes:  Is the patient diabetic?  Yes  If diabetic, was a CBG obtained today?  No  Did the patient bring in their glucometer from home?  No  How often do you monitor your CBG's? never.   Financial Strains and Diabetes Management:  Are you having any financial strains with the device, your supplies or your medication? No .  Does the patient want to be seen by Chronic Care Management for management of their diabetes?  No  Would the patient like to be referred to a Nutritionist or for Diabetic Management?  No   Diabetic Exams:  Diabetic Eye Exam: Completed 06/01/16. Overdue for diabetic eye exam. Pt has been advised about the importance in completing this exam.  Diabetic Foot Exam: Completed 03/31/22. Pt has been advised about the importance in completing this exam.   Interpreter Needed?: No  Information entered by :: Kirke Shaggy, LPN   Activities of Daily Living    08/21/2022    8:52 AM  In your present state of health, do you have any difficulty performing the following activities:  Hearing? 0  Vision? 0  Difficulty concentrating or making decisions? 0  Walking or climbing stairs? 0  Dressing or bathing? 0  Doing errands, shopping? 0  Preparing Food and eating ? N  Using the Toilet? N  In the past six months, have you accidently leaked urine? N  Do you have problems with loss of bowel control? N  Managing your Medications? N  Managing your Finances? N  Housekeeping or managing your Housekeeping? N    Patient Care Team: Valerie Roys, DO as PCP -  General (Family Medicine) End, Harrell Gave, MD as PCP - Cardiology (Cardiology) Sueanne Margarita, MD as PCP - Sleep Medicine  (Cardiology)  Indicate any recent Medical Services you may have received from other than [facility] providers in the past year (date may be approximate).     Assessment:   This is a routine wellness examination for Giro.  Hearing/Vision screen Hearing Screening - Comments:: No aids Vision Screening - Comments:: No glasses  Dietary issues and exercise activities discussed: Current Exercise Habits: The patient does not participate in regular exercise at present   Goals Addressed             This Visit's Progress    DIET - EAT MORE FRUITS AND VEGETABLES         Depression Screen    08/21/2022    8:49 AM 05/29/2022    8:54 AM 05/02/2022   10:38 AM 03/31/2022    8:42 AM 12/23/2021    9:05 AM 07/21/2021   12:26 PM 07/16/2020    9:47 AM  PHQ 2/9 Scores  PHQ - 2 Score 2 2 4 2 4 $ 0 3  PHQ- 9 Score 5 5 9 9 7  3    $ Fall Risk    08/21/2022    8:52 AM 03/31/2022    8:42 AM 12/23/2021    9:06 AM 07/21/2021   12:19 PM 05/11/2021   10:33 AM  Fall Risk   Falls in the past year?  1 1 0 0  Number falls in past yr: 1 1 1 $ 0 0  Injury with Fall? 1 1 0 0 0  Risk for fall due to : History of fall(s) History of fall(s) No Fall Risks  No Fall Risks  Follow up Falls evaluation completed;Falls prevention discussed Falls evaluation completed Falls evaluation completed Falls evaluation completed;Falls prevention discussed Falls prevention discussed    FALL RISK PREVENTION PERTAINING TO THE HOME:  Any stairs in or around the home? Yes  If so, are there any without handrails? No  Home free of loose throw rugs in walkways, pet beds, electrical cords, etc? Yes  Adequate lighting in your home to reduce risk of falls? Yes   ASSISTIVE DEVICES UTILIZED TO PREVENT FALLS:  Life alert? No  Use of a cane, walker or w/c? No  Grab bars in the bathroom? No  Shower chair or bench in shower? No  Elevated toilet seat or a handicapped toilet? No    Cognitive Function:        08/21/2022    8:58 AM  07/16/2020    9:50 AM 07/11/2018   11:30 AM 07/06/2017   10:12 AM  6CIT Screen  What Year? 0 points 0 points 0 points 0 points  What month? 0 points 0 points 0 points 0 points  What time? 0 points 0 points 0 points 0 points  Count back from 20 0 points 0 points 0 points 0 points  Months in reverse 0 points 0 points 0 points 0 points  Repeat phrase 0 points 8 points 0 points 0 points  Total Score 0 points 8 points 0 points 0 points    Immunizations Immunization History  Administered Date(s) Administered   Fluad Quad(high Dose 65+) 05/11/2021, 05/29/2022   Influenza,inj,Quad PF,6+ Mos 04/19/2016, 05/17/2017, 04/03/2018   Pneumococcal Conjugate-13 05/11/2021   Pneumococcal Polysaccharide-23 06/09/2014   Tdap 06/09/2014, 10/17/2020    TDAP status: Up to date  Flu Vaccine status: Up to date  Pneumococcal vaccine status: Up to date  Covid-19  vaccine status: Completed vaccines  Qualifies for Shingles Vaccine? Yes   Zostavax completed No   Shingrix Completed?: No.    Education has been provided regarding the importance of this vaccine. Patient has been advised to call insurance company to determine out of pocket expense if they have not yet received this vaccine. Advised may also receive vaccine at local pharmacy or Health Dept. Verbalized acceptance and understanding.  Screening Tests Health Maintenance  Topic Date Due   COVID-19 Vaccine (1) Never done   Zoster Vaccines- Shingrix (1 of 2) Never done   OPHTHALMOLOGY EXAM  06/01/2017   Pneumonia Vaccine 60+ Years old (3 of 3 - PPSV23 or PCV20) 05/11/2022   HEMOGLOBIN A1C  09/29/2022   Diabetic kidney evaluation - Urine ACR  12/24/2022   FOOT EXAM  04/01/2023   Diabetic kidney evaluation - eGFR measurement  05/03/2023   Lung Cancer Screening  06/22/2023   Medicare Annual Wellness (AWV)  08/22/2023   DTaP/Tdap/Td (3 - Td or Tdap) 10/18/2030   Hepatitis C Screening  Completed   HPV VACCINES  Aged Out   INFLUENZA VACCINE   Discontinued   COLONOSCOPY (Pts 45-88yr Insurance coverage will need to be confirmed)  Discontinued    Health Maintenance  Health Maintenance Due  Topic Date Due   COVID-19 Vaccine (1) Never done   Zoster Vaccines- Shingrix (1 of 2) Never done   OPHTHALMOLOGY EXAM  06/01/2017   Pneumonia Vaccine 68 Years old (3 of 3 - PPSV23 or PCV20) 05/11/2022    Colonoscopy referral declined  Lung Cancer Screening: (Low Dose CT Chest recommended if Age 68-80years, 30 pack-year currently smoking OR have quit w/in 15years.) does not qualify.   Additional Screening:  Hepatitis C Screening: does qualify; Completed 02/09/15 Vision Screening: Recommended annual ophthalmology exams for early detection of glaucoma and other disorders of the eye. Is the patient up to date with their annual eye exam?  No  Who is the provider or what is the name of the office in which the patient attends annual eye exams? No one If pt is not established with a provider, would they like to be referred to a provider to establish care? No .   Dental Screening: Recommended annual dental exams for proper oral hygiene  Community Resource Referral / Chronic Care Management: CRR required this visit?  No   CCM required this visit?  No      Plan:     I have personally reviewed and noted the following in the patient's chart:   Medical and social history Use of alcohol, tobacco or illicit drugs  Current medications and supplements including opioid prescriptions. Patient is not currently taking opioid prescriptions. Functional ability and status Nutritional status Physical activity Advanced directives List of other physicians Hospitalizations, surgeries, and ER visits in previous 12 months Vitals Screenings to include cognitive, depression, and falls Referrals and appointments  In addition, I have reviewed and discussed with patient certain preventive protocols, quality metrics, and best practice recommendations. A  written personalized care plan for preventive services as well as general preventive health recommendations were provided to patient.     LDionisio David LPN   [id number]  Nurse Notes: none

## 2022-08-21 NOTE — Patient Instructions (Signed)
Roberto [name] , Thank you for taking time to come for your Medicare Wellness Visit. I appreciate your ongoing commitment to your health goals. Please review the following plan we discussed and let me know if I can assist you in the future.   These are the goals we discussed:  Goals       DIET - EAT MORE FRUITS AND VEGETABLES      DIET - INCREASE WATER INTAKE      Recommend drinking at least 6-8 glasses of water a day       Exercise 3x per week (30 min per time)      Recommend starting a routine exercise program at least 3 days a week for 30-45 minutes at a time as tolerated.        Patient Stated      07/16/2020, wants to weigh 170 pounds      PharmD "We are having a hard time affording his medications" (pt-stated)      Current Barriers:  Polypharmacy; complex patient with multiple comorbidities including HFrEF, aortic stenosis, atherosclerosis, T2DM, COPD d/t tobacco abuse, gout Caregiver, Roberto [name], manages his medications Financial barriers - patient has applied for Medicaid, but has been denied and Roberto [name] notes that hearing didn't go well. Collaboratively applied for Medicare Extra Help on 04/10/2019.  HFrEF (40-45%); followed by Dr. Saunders Revel, seen in [facility] by [facility] - furoesmide 60 mg BID, spironolactone 25 mg daily, lisinopril 5 mg daily, metoprolol succinate 100 mg daily; potassium 20 mEq daily  Lisinopril last filled 01/03/2019 for 90 day supply ASCVD risk reduction: rosuvastatin 40 mg daily - just increased d/t LDL of 93, TG >500; goal LDL <70. Vascepa 2 g BID also added; received call from Roberto [name] that Roberto [name] is too expensive on his insurance; ASA 81 mg daily Noted to cardiology that he stopped smoking ~10 days ago Aortic aneurysm; following w/ CT surgery to schedule repair S/p mechanical AVR; warfarin daily to maintain INR 2-3 COPD/tobacco abuse: patient noted he quit smoking ~10 days ago; continues on Advair 250/50 BID + Spiriva 81 mcg daily, albuterol HFA PRN; bupropion XL 150 mg daily for  tobacco cessation Receiving Spiriva through [employer] patient assistance through end of 2020. Will need to start the process to reapply for 2021 Chronic pain: nortriptyline 10 mg QPM; cyclobenzaprine 10 mg daily  Gout: allopurinol 100 mg daily   Pharmacist Clinical Goal(s):  Over the next 90 days, patient will work with PharmD and provider towards optimized medication management  Interventions: Comprehensive medication review performed; medication list updated in electronic medical record Contacted [name] to discuss her concerns w/ Vascepa cost. Left HIPAA compliant message for her to return my call at her convenience. There is not patient assistance for Vascepa, however, could discuss application to [employer] for hyperlipidemia.   Patient Self Care Activities:  Patient will take medications as prescribed  Initial goal documentation       SW-'We need to get Roberto [name] approved for Medicaid" (pt-stated)      Current Barriers:  Financial constraints related to managing health care expenses Limited social support Level of care concerns ADL IADL limitations Social Isolation Limited access to caregiver  Clinical Social Work Clinical Goal(s):  Over the next 120 days, patient will work with SW to address concerns related to gaining Adult Medicaid for patient in order to alleviate financial strain  Interventions: Patient interviewed and appropriate assessments performed Provided patient with information about Medicaid enrollment and strict enrollment guidelines  Discussed plans with patient  for ongoing care management follow up and provided patient with direct contact information for care management team Advised patient to work on gathering all of pt's medical bills in order to get him approved for Medicaid. Patient has been trying to apply for Medicaid since February of [id number] and his application was lost. Patient completed an additional application in August of 2020 but was denied services  because patient did not reach the Medical Bill amount needed to get approved for Medicaid. Friend reports that several of these medical bills were lost when his first Medicaid application went missing in February of this year.  LCSW left voice message with Medicaid casework Roberto [name] at [phone].  LCSW updated CCM Pharmacist and provided this contact information as well ^. Assisted patient/caregiver with obtaining information about health plan benefits Provided education and assistance to client regarding Advanced Directives. Provided education to patient/caregiver regarding level of care options.  Patient Self Care Activities:  Attends all scheduled provider appointments Unable to independently afford medical expenses and was declined Medicaid  Initial goal documentation       Weight (lb) < 200 lb (90.7 kg)        This is a list of the screening recommended for you and due dates:  Health Maintenance  Topic Date Due   COVID-19 Vaccine (1) Never done   Zoster (Shingles) Vaccine (1 of 2) Never done   Eye exam for diabetics  06/01/2017   Pneumonia Vaccine (3 of 3 - PPSV23 or PCV20) 05/11/2022   Hemoglobin A1C  09/29/2022   Yearly kidney health urinalysis for diabetes  12/24/2022   Complete foot exam   04/01/2023   Yearly kidney function blood test for diabetes  05/03/2023   Screening for Lung Cancer  06/22/2023   Medicare Annual Wellness Visit  08/22/2023   DTaP/Tdap/Td vaccine (3 - Td or Tdap) 10/18/2030   Hepatitis C Screening: USPSTF Recommendation to screen - Ages 54-79 yo.  Completed   HPV Vaccine  Aged Out   Flu Shot  Discontinued   Colon Cancer Screening  Discontinued    Advanced directives: no  Conditions/risks identified: none  Next appointment: Follow up in one year for your annual wellness visit. 08/27/23 @ 8:45 am by phone  Preventive Care 65 Years and Older, Male  Preventive care refers to lifestyle choices and visits with your health care provider that can  promote health and wellness. What does preventive care include? A yearly physical exam. This is also called an annual well check. Dental exams once or twice a year. Routine eye exams. Ask your health care provider how often you should have your eyes checked. Personal lifestyle choices, including: Daily care of your teeth and gums. Regular physical activity. Eating a healthy diet. Avoiding tobacco and drug use. Limiting alcohol use. Practicing safe sex. Taking low doses of aspirin every day. Taking vitamin and mineral supplements as recommended by your health care provider. What happens during an annual well check? The services and screenings done by your health care provider during your annual well check will depend on your age, overall health, lifestyle risk factors, and family history of disease. Counseling  Your health care provider may ask you questions about your: Alcohol use. Tobacco use. Drug use. Emotional well-being. Home and relationship well-being. Sexual activity. Eating habits. History of falls. Memory and ability to understand (cognition). Work and work Statistician. Screening  You may have the following tests or measurements: Height, weight, and BMI. Blood pressure. Lipid and cholesterol levels. These may  be checked every 5 years, or more frequently if you are over 63 years old. Skin check. Lung cancer screening. You may have this screening every year starting at age 69 if you have a 30-pack-year history of smoking and currently smoke or have quit within the past 15 years. Fecal occult blood test (FOBT) of the stool. You may have this test every year starting at age 65. Flexible sigmoidoscopy or colonoscopy. You may have a sigmoidoscopy every 5 years or a colonoscopy every 10 years starting at age 99. Prostate cancer screening. Recommendations will vary depending on your family history and other risks. Hepatitis C blood test. Hepatitis B blood test. Sexually  transmitted disease (STD) testing. Diabetes screening. This is done by checking your blood sugar (glucose) after you have not eaten for a while (fasting). You may have this done every 1-3 years. Abdominal aortic aneurysm (AAA) screening. You may need this if you are a current or former smoker. Osteoporosis. You may be screened starting at age 86 if you are at high risk. Talk with your health care provider about your test results, treatment options, and if necessary, the need for more tests. Vaccines  Your health care provider may recommend certain vaccines, such as: Influenza vaccine. This is recommended every year. Tetanus, diphtheria, and acellular pertussis (Tdap, Td) vaccine. You may need a Td booster every 10 years. Zoster vaccine. You may need this after age 63. Pneumococcal 13-valent conjugate (PCV13) vaccine. One dose is recommended after age 91. Pneumococcal polysaccharide (PPSV23) vaccine. One dose is recommended after age 8. Talk to your health care provider about which screenings and vaccines you need and how often you need them. This information is not intended to replace advice given to you by your health care provider. Make sure you discuss any questions you have with your health care provider. Document Released: 07/16/2015 Document Revised: 03/08/2016 Document Reviewed: 04/20/2015 Elsevier Interactive Patient Education  2017 [place] Prevention in the Home Falls can cause injuries. They can happen to people of all ages. There are many things you can do to make your home safe and to help prevent falls. What can I do on the outside of my home? Regularly fix the edges of walkways and driveways and fix any cracks. Remove anything that might make you trip as you walk through a door, such as a raised step or threshold. Trim any bushes or trees on the path to your home. Use bright outdoor lighting. Clear any walking paths of anything that might make someone trip, such as  rocks or tools. Regularly check to see if handrails are loose or broken. Make sure that both sides of any steps have handrails. Any raised decks and porches should have guardrails on the edges. Have any leaves, snow, or ice cleared regularly. Use sand or salt on walking paths during winter. Clean up any spills in your garage right away. This includes oil or grease spills. What can I do in the bathroom? Use night lights. Install grab bars by the toilet and in the tub and shower. Do not use towel bars as grab bars. Use non-skid mats or decals in the tub or shower. If you need to sit down in the shower, use a plastic, non-slip stool. Keep the floor dry. Clean up any water that spills on the floor as soon as it happens. Remove soap buildup in the tub or shower regularly. Attach bath mats securely with double-sided non-slip rug tape. Do not have throw rugs and  other things on the floor that can make you trip. What can I do in the bedroom? Use night lights. Make sure that you have a light by your bed that is easy to reach. Do not use any sheets or blankets that are too big for your bed. They should not hang down onto the floor. Have a firm chair that has side arms. You can use this for support while you get dressed. Do not have throw rugs and other things on the floor that can make you trip. What can I do in the kitchen? Clean up any spills right away. Avoid walking on wet floors. Keep items that you use a lot in easy-to-reach places. If you need to reach something above you, use a strong step stool that has a grab bar. Keep electrical cords out of the way. Do not use floor polish or wax that makes floors slippery. If you must use wax, use non-skid floor wax. Do not have throw rugs and other things on the floor that can make you trip. What can I do with my stairs? Do not leave any items on the stairs. Make sure that there are handrails on both sides of the stairs and use them. Fix handrails  that are broken or loose. Make sure that handrails are as long as the stairways. Check any carpeting to make sure that it is firmly attached to the stairs. Fix any carpet that is loose or worn. Avoid having throw rugs at the top or bottom of the stairs. If you do have throw rugs, attach them to the floor with carpet tape. Make sure that you have a light switch at the top of the stairs and the bottom of the stairs. If you do not have them, ask someone to add them for you. What else can I do to help prevent falls? Wear shoes that: Do not have high heels. Have rubber bottoms. Are comfortable and fit you well. Are closed at the toe. Do not wear sandals. If you use a stepladder: Make sure that it is fully opened. Do not climb a closed stepladder. Make sure that both sides of the stepladder are locked into place. Ask someone to hold it for you, if possible. Clearly mark and make sure that you can see: Any grab bars or handrails. First and last steps. Where the edge of each step is. Use tools that help you move around (mobility aids) if they are needed. These include: Canes. Walkers. Scooters. Crutches. Turn on the lights when you go into a dark area. Replace any light bulbs as soon as they burn out. Set up your furniture so you have a clear path. Avoid moving your furniture around. If any of your floors are uneven, fix them. If there are any pets around you, be aware of where they are. Review your medicines with your doctor. Some medicines can make you feel dizzy. This can increase your chance of falling. Ask your doctor what other things that you can do to help prevent falls. This information is not intended to replace advice given to you by your health care provider. Make sure you discuss any questions you have with your health care provider. Document Released: 04/15/2009 Document Revised: 11/25/2015 Document Reviewed: 07/24/2014 Elsevier Interactive Patient Education  2017 [employer].

## 2022-08-22 NOTE — Telephone Encounter (Signed)
Requested medication (s) are due for refill today: yes  Requested medication (s) are on the active medication list: yes  Last refill:  05/15/22 #90 0 refills  Future visit scheduled: yes in 1 month  Notes to clinic:  protocol failed. Do you want to refill Rx?     Requested Prescriptions  Pending Prescriptions Disp Refills   cetirizine (ZYRTEC) 10 MG tablet [Pharmacy Med Name: Cetirizine HCl 10 MG Oral Tablet] 90 tablet 0    Sig: Take 1 tablet by mouth once daily     Ear, Nose, and Throat:  Antihistamines 2 Failed - 08/21/2022  9:08 AM      Failed - Cr in normal range and within 360 days    Creatinine  Date Value Ref Range Status  05/25/2014 0.85 0.60 - 1.30 mg/dL Final   Creatinine, Ser  Date Value Ref Range Status  05/02/2022 1.37 (H) 0.76 - 1.27 mg/dL Final         Passed - Valid encounter within last 12 months    Recent Outpatient Visits           1 month ago [place], [name]   1 month ago Acute cough   [place], [place] T, NP   2 months ago Tremor of left hand   [place] [facility] Mecum, Erin E, [name]   3 months ago Upper respiratory tract infection, unspecified type   [place], Erin E, [name]   3 months ago Hypertension associated with diabetes [facility])   [place], Barb Merino, DO       Future Appointments             In 3 weeks End, Harrell Gave, MD [place] [facility]   In 1 month [name], [place], [facility]

## 2022-08-23 ENCOUNTER — Ambulatory Visit (INDEPENDENT_AMBULATORY_CARE_PROVIDER_SITE_OTHER): Payer: 59 | Admitting: Family Medicine

## 2022-08-23 DIAGNOSIS — Z952 Presence of prosthetic heart valve: Secondary | ICD-10-CM

## 2022-08-27 ENCOUNTER — Ambulatory Visit: Payer: Self-pay | Admitting: Family Medicine

## 2022-08-27 DIAGNOSIS — Z952 Presence of prosthetic heart valve: Secondary | ICD-10-CM

## 2022-08-27 LAB — PROTIME-INR: INR: 3.2 — AB (ref 0.80–1.20)

## 2022-08-29 ENCOUNTER — Telehealth: Payer: Self-pay | Admitting: Internal Medicine

## 2022-08-29 NOTE — Telephone Encounter (Signed)
Diastolic BP reported at nephrology visit is a little lower than baseline but systolic readings are consistent with BP's seen in our office for quite some time.  As long as he remains asymptomatic, I agree with continuing current medications and monitoring BP at home.  Nelva Bush, MD [facility]

## 2022-08-29 NOTE — Telephone Encounter (Signed)
Patient just left the kidney dr and bp was 98/48 HR 50. Calling to let our office see it the patient needs to be seen. Please advise

## 2022-08-29 NOTE — Telephone Encounter (Signed)
[name] made aware of MD's recommendation and verbalized understanding.

## 2022-08-29 NOTE — Telephone Encounter (Addendum)
Pt's friend [name] (listed on [place]) called to report pt's BP at nephrologist office today was 98/48 HR 50. However, she reported pt is asymptomatic and staying well hydrated. She stated they don't regularly check BP.  Nurse recommended pt keep a log daily to monitor trend. Will also forward to MD for further recommendations.

## 2022-09-15 ENCOUNTER — Ambulatory Visit: Payer: 59 | Attending: Internal Medicine | Admitting: Internal Medicine

## 2022-09-15 ENCOUNTER — Encounter: Payer: Self-pay | Admitting: Internal Medicine

## 2022-09-15 VITALS — BP 102/68 | HR 78 | Ht 66.0 in | Wt 295.0 lb

## 2022-09-15 DIAGNOSIS — Z952 Presence of prosthetic heart valve: Secondary | ICD-10-CM

## 2022-09-15 DIAGNOSIS — E1169 Type 2 diabetes mellitus with other specified complication: Secondary | ICD-10-CM

## 2022-09-15 DIAGNOSIS — I5022 Chronic systolic (congestive) heart failure: Secondary | ICD-10-CM | POA: Diagnosis not present

## 2022-09-15 DIAGNOSIS — E785 Hyperlipidemia, unspecified: Secondary | ICD-10-CM

## 2022-09-15 DIAGNOSIS — I7121 Aneurysm of the ascending aorta, without rupture: Secondary | ICD-10-CM

## 2022-09-15 DIAGNOSIS — I2510 Atherosclerotic heart disease of native coronary artery without angina pectoris: Secondary | ICD-10-CM | POA: Diagnosis not present

## 2022-09-15 DIAGNOSIS — R29898 Other symptoms and signs involving the musculoskeletal system: Secondary | ICD-10-CM

## 2022-09-15 MED ORDER — FUROSEMIDE 80 MG PO TABS: 80.0000 mg | ORAL_TABLET | Freq: Every day | ORAL | 3 refills | Status: DC

## 2022-09-15 NOTE — Progress Notes (Signed)
Follow-up Outpatient Visit Date: 09/15/2022  Primary Care Provider: Valerie Roys, DO [place] [address]  Chief Complaint: Follow-up heart failure, valvular heart disease, and thoracic aortic aneurysm  HPI:  Roberto [name] is a 67 y.o. male with history of mechanical aortic valve replacement, non-obstructive coronary artery disease, thoracic aortic aneurysm (followed by [facility]), stroke, right renal infarct, chronic systolic heart failure (LVEF 40-45%), hyperlipidemia, obstructive sleep apnea not using CPAP, and morbid obesity, who presents for follow-up of valvular heart disease, HFrEF, and thoracic aortic aneurysm.  I last saw him in mid December, at which time he was feeling fairly well from a heart standpoint.  He was complaining of some leg pain and weakness after his right knee had buckled.  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  Unfortunately, this fell through.  He was seen by Dr. Cyndia Bent and follow-up in December; 5.5 cm thoracic aortic aneurysm was stable.  Given comorbidities, continued surveillance was recommended.  Today, Roberto [name] reports he has been feeling well from a heart standpoint.  He happily reports that he has lost about 10 pounds, though he has not been exercising regularly.  He denies chest pain, shortness of breath, and palpitations.  He has occasional mild lightheadedness but has not passed out or fallen.  He reports a single episode of transient leg weakness and numbness a few days ago.  He was trying to get up from the bed but could not stand.  This resolved suddenly after about 5 minutes and has not recurred.  It seemed to affect both legs.  He also notes intermittent right temporal headache.  He had a brain MRI last month ordered by neurology for workup of tremors.  This showed mild chronic small vessel ischemic disease and multiple small chronic cerebral and cerebellar infarcts but no acute abnormality.  He remains compliant with his  medications including warfarin and aspirin.  Last INR in February was slightly supratherapeutic.  --------------------------------------------------------------------------------------------------  Past Medical History:  Diagnosis Date   Bicuspid aortic valve    a. s/p #27 Carbomedics mechanical valve on 03/25/2010; b. on Coumadin; c. TTE 12/17: EF 40-45%, moderately dilated LV with moderate LVH, AVR well-seated with 14 mmHg gradient, peak AV velocity 2.5 m/s, mild mitral valve thickening with mild MR, mildly dilated RV with mildly reduced contraction   Cellulitis    Chronic kidney disease    Chronic systolic CHF (congestive heart failure) ([place])    a. R/LHC 03/2010 showed no significant CAD, LVEDP 31 mmHg, mean AoV gradient 34 mmHg at rest and 47 mmHg with dobutamine 20 mcg/kg/min, AVA 1.0 cm^2, RA 31, RV 68/25, PA 68/47, PCWP 38. PA sat 65%. CO 6.2 L/min ([place]) and 5.3 L/min (thermodilution)   Clotting disorder (HCC)    H/O mechanical aortic valve replacement 03/25/2010   a. #27 Carbomedics mechanical valve   Hypercholesterolemia    Renal infarct [facility]) 2017   Multiple right renal infarcts, likely embolic.   Stroke [facility])    TIA (transient ischemic attack) 05/2014   Past Surgical History:  Procedure Laterality Date   AORTIC VALVE REPLACEMENT     CARDIAC CATHETERIZATION  03/21/2010   No significant CAD. Severe aortic stenosis. Severely elevated left and right heart filling pressures.   CARDIAC SURGERY  2009   CHF   CARPAL TUNNEL RELEASE Left 2005   RIGHT HEART CATH AND CORONARY ANGIOGRAPHY N/A 01/28/2019   Procedure: RIGHT HEART CATH AND CORONARY ANGIOGRAPHY;  Surgeon: Nelva Bush,  MD;  Location: [facility];  Service: Cardiovascular;  Laterality: N/A;   TONSILLECTOMY  1962    Current Meds  Medication Sig   albuterol (PROVENTIL) (2.5 MG/3ML) 0.083% nebulizer solution USE 1 VIAL IN NEBULIZER EVERY 6 HOURS AS NEEDED FOR WHEEZING FOR SHORTNESS OF BREATH   albuterol  (VENTOLIN HFA) 108 (90 Base) MCG/ACT inhaler INHALE 2 PUFFS EVERY 6 HOURS AS NEEDED FOR WHEEZING FOR SHORTNESS OF BREATH   allopurinol (ZYLOPRIM) 300 MG tablet Take 1 tablet by mouth once daily   aspirin 81 MG chewable tablet Chew 81 mg by mouth daily.   cetirizine (ZYRTEC) 10 MG tablet Take 1 tablet by mouth once daily   cyclobenzaprine (FLEXERIL) 10 MG tablet Take 1 tablet (10 mg total) by mouth 3 times/day as needed-between meals & bedtime for muscle spasms.   dapagliflozin propanediol (FARXIGA) 10 MG TABS tablet Take 1 tablet (10 mg total) by mouth daily.   fenofibrate (TRICOR) 48 MG tablet Take 1 tablet (48 mg total) by mouth daily.   furosemide (LASIX) 40 MG tablet TAKE 2 TABLETS BY MOUTH IN THE MORNING AND 1 TABLET IN THE EVENING   lisinopril (ZESTRIL) 2.5 MG tablet Take 1 tablet (2.5 mg total) by mouth daily.   metFORMIN (GLUCOPHAGE-XR) 500 MG 24 hr tablet Take 1 tablet by mouth once daily with breakfast   metoprolol succinate (TOPROL-XL) 100 MG 24 hr tablet TAKE 1 TABLET BY MOUTH ONCE DAILY WITH  OR  IMMEDIATELY  FOLLOWING  A  MEAL   nortriptyline (PAMELOR) 10 MG capsule Take 1 capsule (10 mg total) by mouth at bedtime.   Omega-3 Fatty Acids (FISH OIL) 1000 MG CAPS Take 5,000 mg by mouth daily.   potassium chloride SA (KLOR-CON M) 20 MEQ tablet Take 1 tablet (20 mEq total) by mouth daily.   rosuvastatin (CRESTOR) 40 MG tablet Take 1 tablet (40 mg total) by mouth daily.   Semaglutide, 1 MG/DOSE, 4 MG/3ML SOPN Inject 1 mg as directed once a week.   TRELEGY ELLIPTA 100-62.5-25 MCG/ACT AEPB Inhale 1 puff into the lungs daily.   warfarin (COUMADIN) 4 MG tablet TAKE 1 TABLET BY MOUTH ONCE DAILY AT 6PM AS DIRECTED   warfarin (COUMADIN) 7.5 MG tablet TAKE 4MG  BY MOUTH FOR 2 DAYS, THEN 7.5MG  ON THE THIRD DAY, THEN REPEAT.    Allergies: Patient has no known allergies.  Social History   Tobacco Use   Smoking status: Former    Packs/day: 0.50    Years: 46.00    Additional pack years: 0.00     Total pack years: 23.00    Types: Cigarettes    Start date: 07/16/2017    Quit date: 05/11/2019    Years since quitting: 3.3   Smokeless tobacco: Never  Vaping Use   Vaping Use: Never used  Substance Use Topics   Alcohol use: No    Alcohol/week: 0.0 standard drinks of alcohol   Drug use: No    Family History  Problem Relation Age of Onset   Arthritis Mother    Dementia Mother    Colon cancer Mother    Arthritis Father    Diabetes Father    Stroke Father    Colon cancer Father    Heart attack Brother    Breast cancer Sister    Seizures Sister    Cancer Brother        brain   Heart disease Brother    Heart attack Brother     Review of Systems: A  12-system review of systems was performed and was negative except as noted in the HPI.  --------------------------------------------------------------------------------------------------  Physical Exam: BP 102/68 (BP Location: Left Arm, Patient Position: Sitting, Cuff Size: Large)   Pulse 78   Ht 5\' 6"  (1.676 m)   Wt 295 lb (133.8 kg)   SpO2 95%   BMI 47.61 kg/m   General:  NAD. Neck: No JVD or HJR, though body habitus limits evaluation. Lungs: Clear to auscultation bilaterally without wheezes or crackles. Heart: Regular rate and rhythm without murmurs.  Mechanical S2 noted. Abdomen: Obese but soft and nontender. Extremities: No lower extremity edema.  Moves both lower extremities without difficulty.  Lab Results  Component Value Date   WBC 7.6 12/23/2021   HGB 14.1 12/23/2021   HCT 42.9 12/23/2021   MCV 87 12/23/2021   PLT 249 12/23/2021    Lab Results  Component Value Date   NA 141 05/02/2022   K 4.5 05/02/2022   CL 101 05/02/2022   CO2 22 05/02/2022   BUN 33 (H) 05/02/2022   CREATININE 1.37 (H) 05/02/2022   GLUCOSE 102 (H) 05/02/2022   ALT 24 12/23/2021    Lab Results  Component Value Date   CHOL 161 12/23/2021   HDL 37 (L) 12/23/2021   LDLCALC 57 12/23/2021   LDLDIRECT 96.4 06/04/2019   TRIG  444 (H) 12/23/2021   CHOLHDL 4.3 08/08/2019    --------------------------------------------------------------------------------------------------  ASSESSMENT AND PLAN: Chronic HFrEF: Roberto [name] appears euvolemic with stable NYHA class II symptoms.  Given continued borderline low blood pressure and occasional lightheadedness, we have agreed to hold his afternoon dose of furosemide.  He should continue furosemide 80 mg every morning and take an additional 40 mg in the afternoon for weight gain/edema.  His chronic soft blood pressure precludes escalation of his goal-directed medical therapy, consisting of dapagliflozin, lisinopril, and metoprolol succinate.  Coronary artery disease and hyperlipidemia associated with type 2 diabetes mellitus: No angina reported.  Continue medical therapy to prevent progression of disease, including rosuvastatin, fish oil, and fenofibrate in the setting of marked hypertriglyceridemia.  I encouraged continued diet and exercise to help lose weight and improve his lipid profile.  Valvular heart disease and thoracic aortic aneurysm: Findings are stable.  Continue to work on blood pressure and lipid control.  Continue annual follow-up with Dr. Cyndia Bent.  Ongoing management of chronic anticoagulation with warfarin per Dr. Wynetta Emery.  Leg weakness: Roberto [name] reports an isolated episode of transient leg numbness and weakness while lying in bed.  It resolved after 5 minutes and has not returned.  Bilateral nature with rapid resolution would be unusual for a TIA or stroke.  INR was actually a little supratherapeutic on last check.  He does not have any focal neurodeficits today.  It is possible he may have had some sciatic nerve compression from the way he was lying, leading to this weakness.  I have encouraged him to follow-up with his neurologist at his earliest convenience.  I also instructed him to call 911 or he to develop any sudden weakness/paresthesia again.  Morbid  obesity: BMI remains greater than 40.  I congratulated Mr. to [place] on losing 10 pounds since December.  I have encouraged him to continue working on diet and mild to moderate intensity cardiovascular exercise.  He should avoid straining in order to limit blood sugar spikes that could further stresses aortic aneurysm.  Follow-up: Return to clinic in 3 months.  Nelva Bush, MD 09/15/2022 9:01 AM

## 2022-09-15 NOTE — Patient Instructions (Signed)
Medication Instructions:  Your physician recommends the following medication changes.  DECREASE: Lasix to 80 mg by mouth in the morning    *If you need a refill on your cardiac medications before your next appointment, please call your pharmacy*   Lab Work: None ordered today   Testing/Procedures: None ordered today   Follow-Up: At [facility], you and your health needs are our priority.  As part of our continuing mission to provide you with exceptional heart care, we have created designated Provider Care Teams.  These Care Teams include your primary Cardiologist (physician) and Advanced Practice Providers (APPs -  Physician Assistants and Nurse Practitioners) who all work together to provide you with the care you need, when you need it.  We recommend signing up for the patient portal called "MyChart".  Sign up information is provided on this After Visit Summary.  MyChart is used to connect with patients for Virtual Visits (Telemedicine).  Patients are able to view lab/test results, encounter notes, upcoming appointments, etc.  Non-urgent messages can be sent to your provider as well.   To learn more about what you can do with MyChart, go to [email or web address].    Your next appointment:   3 month(s)  Provider:   You may see Nelva Bush, MD or one of the following Advanced Practice Providers on your designated Care Team:   Murray Hodgkins, NP Christell Faith, [name] Cadence Kathlen Mody, [name] Gerrie Nordmann, NP

## 2022-09-16 ENCOUNTER — Encounter: Payer: Self-pay | Admitting: Family Medicine

## 2022-09-16 DIAGNOSIS — R251 Tremor, unspecified: Secondary | ICD-10-CM

## 2022-09-16 LAB — PROTIME-INR: INR: 2.5 — AB (ref 0.80–1.20)

## 2022-09-20 ENCOUNTER — Ambulatory Visit (INDEPENDENT_AMBULATORY_CARE_PROVIDER_SITE_OTHER): Payer: 59 | Admitting: Family Medicine

## 2022-09-20 DIAGNOSIS — Z952 Presence of prosthetic heart valve: Secondary | ICD-10-CM | POA: Diagnosis not present

## 2022-09-21 ENCOUNTER — Telehealth (INDEPENDENT_AMBULATORY_CARE_PROVIDER_SITE_OTHER): Payer: 59 | Admitting: Physician Assistant

## 2022-09-21 ENCOUNTER — Encounter: Payer: Self-pay | Admitting: Physician Assistant

## 2022-09-21 VITALS — BP 114/74 | HR 69 | Temp 95.4°F

## 2022-09-21 DIAGNOSIS — J069 Acute upper respiratory infection, unspecified: Secondary | ICD-10-CM | POA: Diagnosis not present

## 2022-09-21 NOTE — Progress Notes (Signed)
Virtual Visit via Video Note  I connected with Roberto [name] on 09/21/22 at 10:20 AM EDT by a video enabled telemedicine application and verified that I am speaking with the correct person using two identifiers.  Today's Provider: Talitha Givens, MHS, [name] Introduced myself to the patient as a [name] and provided education on APPs in clinical practice.   Location: Patient: at home Provider: [place], [place]   I discussed the limitations of evaluation and management by telemedicine and the availability of in person appointments. The patient expressed understanding and agreed to proceed.  Chief Complaint  Patient presents with   Cough   Generalized Body Aches   Headache   Chills   Sore Throat   Covid Positive    Patient says he recently was COVID positive about six weeks ago. Patient says he tested this morning and COVID testing was still positive.      History of Present Illness:   URI- type symptoms   Onset: sudden  Duration: since yesterday   Associated symptoms: reports chills, congestion, "raw throat", dry cough, myalgias, headaches   Reports some mild impact on breathing lately- he has been using his rescue inhaler about 2 times per day - this is before he started feeling ill.   Interventions: Coricidin - started this last night and has SafeTussin (reviewed with him, no decongestants present)   Recent sick contacts: not to his knowledge. He had an apt at the hospital with Cardiology but overall has been staying home   COVID testing at home: was positive for COVID in Jan and is still testing positive with home tests  Denies fevers: temp at home and was 95.4     Review of Systems  Constitutional:  Positive for chills and malaise/fatigue.  HENT:  Positive for congestion and sore throat.   Respiratory:  Positive for cough. Negative for shortness of breath and wheezing.   Musculoskeletal:  Positive for myalgias.  Neurological:  Positive for  weakness (no energy) and headaches.      Observations/Objective:  Due to the nature of the virtual visit, physical exam and observations are limited. Able to obtain the following observations:   Alert, oriented, Appears comfortable, in no acute distress.  No scleral injection, no appreciated hoarseness, tachypnea, wheeze or strider. Able to maintain conversation without visible strain.  No cough appreciated during visit.    Assessment and Plan:  Problem List Items Addressed This Visit   None Visit Diagnoses     Viral upper respiratory tract infection    -  Primary Acute, new concern Visit with patient indicates symptoms comprised of nasal congestion, dry cough, chills, fatigue and myalgias since yesterday congruent with acute URI that is likely viral in nature  Patient was COVID positive in Jan and has been testing positive since then with home testing- unsure if this represents new infection vs lingering antibodies triggering positive result Due to nature and duration of symptoms recommended treatment regimen is symptomatic relief and follow up if needed Discussed with patient the various viral and bacterial etiologies of current illness and appropriate course of treatment Discussed OTC medication options for symptom relief such as Mucinex, Robitussin, Tylenol  Discussed return precautions if symptoms are not improving or worsen over next 5-7 days.        Follow Up Instructions:    I discussed the assessment and treatment plan with the patient. The patient was provided an opportunity to ask questions and all were answered. The  patient agreed with the plan and demonstrated an understanding of the instructions.   The patient was advised to call back or seek an in-person evaluation if the symptoms worsen or if the condition fails to improve as anticipated.  I provided 15 minutes of non-face-to-face time during this encounter.  No follow-ups on file.   I, Breona Cherubin E Dunbar Buras,  [name], have reviewed all documentation for this visit. The documentation on 09/21/22 for the exam, diagnosis, procedures, and orders are all accurate and complete.   Talitha Givens, MHS, [name] [facility]

## 2022-09-25 LAB — PROTIME-INR: INR: 3.2 — AB (ref 0.80–1.20)

## 2022-09-29 ENCOUNTER — Ambulatory Visit (INDEPENDENT_AMBULATORY_CARE_PROVIDER_SITE_OTHER): Payer: 59 | Admitting: Family Medicine

## 2022-09-29 DIAGNOSIS — Z952 Presence of prosthetic heart valve: Secondary | ICD-10-CM | POA: Diagnosis not present

## 2022-10-02 ENCOUNTER — Ambulatory Visit: Payer: 59 | Admitting: Family Medicine

## 2022-10-03 LAB — PROTIME-INR: INR: 3.1 — AB (ref 0.80–1.20)

## 2022-10-09 ENCOUNTER — Ambulatory Visit: Payer: Self-pay | Admitting: Family Medicine

## 2022-10-09 DIAGNOSIS — Z952 Presence of prosthetic heart valve: Secondary | ICD-10-CM

## 2022-10-09 LAB — POCT INR: INR: 3.1 — AB (ref 2.0–3.0)

## 2022-10-11 ENCOUNTER — Other Ambulatory Visit: Payer: Self-pay | Admitting: Family Medicine

## 2022-10-12 NOTE — Telephone Encounter (Signed)
Requested Prescriptions  Pending Prescriptions Disp Refills   TRELEGY ELLIPTA 100-62.5-25 MCG/ACT AEPB [Pharmacy Med Name: Trelegy Ellipta 100-62.5-25 MCG/INH Inhalation Aerosol Powder Breath Activated] 180 each 0    Sig: Inhale 1 puff by mouth once daily     Off-Protocol Failed - 10/11/2022  4:24 PM      Failed - Medication not assigned to a protocol, review manually.      Passed - Valid encounter within last 12 months    Recent Outpatient Visits           3 weeks ago Viral upper respiratory tract infection   [place] [facility] Mecum, Oswaldo Conroy, [name]   3 months ago COVID-19   [facility] [employer], Oswaldo Conroy, [name]   3 months ago Acute cough   [place] [facility] [place], Muldrow T, NP   4 months ago Tremor of left hand   [place] [facility] Mecum, Erin E, [name]   5 months ago Upper respiratory tract infection, unspecified type   [place] [facility] Mecum, Oswaldo Conroy, [name]       Future Appointments             In 1 week Laural Benes, Oralia Rud, DO [place] [facility], [facility]   In 2 months End, Cristal Deer, MD [facility] [phone] Base) MCG/ACT inhaler [Pharmacy Med Name: Ventolin HFA 108 (90 Base) MCG/ACT Inhalation Aerosol Solution] 54 g 0    Sig: INHALE 2 PUFFS BY MOUTH EVERY 6 HOURS AS NEEDED FOR WHEEZING OR SHORTNESS OF BREATH     Pulmonology:  Beta Agonists 2 Passed - 10/11/2022  4:24 PM      Passed - Last BP in normal range    BP Readings from Last 1 Encounters:  09/21/22 114/74         Passed - Last Heart Rate in normal range    Pulse Readings from Last 1 Encounters:  09/21/22 69         Passed - Valid encounter within last 12 months    Recent Outpatient Visits           3 weeks ago Viral upper respiratory tract infection   [place] [facility] Mecum, Oswaldo Conroy, [name]   3 months ago COVID-19   [employer], Oswaldo Conroy, [name]   3 months ago Acute cough   [place] [facility] [place], Braham T, NP   4 months ago Tremor of left hand   [place] [facility] Mecum, Erin E, [name]   5 months ago Upper respiratory tract infection, unspecified type   [place] [facility] Mecum, Oswaldo Conroy, [name]       Future Appointments             In 1 week Laural Benes, Oralia Rud, DO  [facility], [facility]   In 2 months End, Cristal Deer, MD [facility]

## 2022-10-16 ENCOUNTER — Ambulatory Visit (INDEPENDENT_AMBULATORY_CARE_PROVIDER_SITE_OTHER): Payer: 59 | Admitting: Family Medicine

## 2022-10-16 DIAGNOSIS — Z952 Presence of prosthetic heart valve: Secondary | ICD-10-CM

## 2022-10-16 LAB — PROTIME-INR: INR: 3.3 — AB (ref 0.80–1.20)

## 2022-10-23 ENCOUNTER — Telehealth: Payer: Self-pay

## 2022-10-23 ENCOUNTER — Ambulatory Visit (INDEPENDENT_AMBULATORY_CARE_PROVIDER_SITE_OTHER): Payer: 59 | Admitting: Family Medicine

## 2022-10-23 ENCOUNTER — Encounter: Payer: Self-pay | Admitting: Family Medicine

## 2022-10-23 ENCOUNTER — Telehealth: Payer: Self-pay | Admitting: Internal Medicine

## 2022-10-23 VITALS — BP 109/65 | HR 78 | Temp 98.1°F | Wt 289.1 lb

## 2022-10-23 DIAGNOSIS — E1159 Type 2 diabetes mellitus with other circulatory complications: Secondary | ICD-10-CM

## 2022-10-23 DIAGNOSIS — Z952 Presence of prosthetic heart valve: Secondary | ICD-10-CM

## 2022-10-23 DIAGNOSIS — I38 Endocarditis, valve unspecified: Secondary | ICD-10-CM

## 2022-10-23 DIAGNOSIS — I152 Hypertension secondary to endocrine disorders: Secondary | ICD-10-CM

## 2022-10-23 DIAGNOSIS — J432 Centrilobular emphysema: Secondary | ICD-10-CM

## 2022-10-23 DIAGNOSIS — Z5321 Procedure and treatment not carried out due to patient leaving prior to being seen by health care provider: Secondary | ICD-10-CM

## 2022-10-23 DIAGNOSIS — E1169 Type 2 diabetes mellitus with other specified complication: Secondary | ICD-10-CM

## 2022-10-23 DIAGNOSIS — E785 Hyperlipidemia, unspecified: Secondary | ICD-10-CM

## 2022-10-23 DIAGNOSIS — R3911 Hesitancy of micturition: Secondary | ICD-10-CM

## 2022-10-23 DIAGNOSIS — M1A9XX0 Chronic gout, unspecified, without tophus (tophi): Secondary | ICD-10-CM

## 2022-10-23 LAB — COAGUCHEK XS/INR WAIVED
INR: 3.7 — ABNORMAL HIGH (ref 0.9–1.1)
Prothrombin Time: 44.1 s

## 2022-10-23 LAB — BAYER DCA HB A1C WAIVED: HB A1C (BAYER DCA - WAIVED): 5.6 % (ref 4.8–5.6)

## 2022-10-23 LAB — POCT INR: INR: 3.7 — AB (ref 2.0–3.0)

## 2022-10-23 NOTE — Telephone Encounter (Signed)
Roberto [name] per DPR has been made aware to contact PCP in reference to INR. Roberto [name] verbalizes understanding.

## 2022-10-23 NOTE — Telephone Encounter (Signed)
Called patient to inform him that he was not seen by the provider and to come back to the office. 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 I then offered to send the message to the office manger and she stated that it wont go any where  with her and I also asked if the provider could call her and she stated the the provider is no better she continued to express that she and the patient was very unhappy.

## 2022-10-23 NOTE — Telephone Encounter (Signed)
Selena Batten is returning call on behalf of the patient.

## 2022-10-23 NOTE — Telephone Encounter (Signed)
LMOM for Roberto [name] to return my call directly at [phone] to discuss concerns about his visit to our office this morning.

## 2022-10-23 NOTE — Telephone Encounter (Signed)
Left a message for the patient to call back.  

## 2022-10-23 NOTE — Telephone Encounter (Signed)
This is not a patient of [facility].  This pt is managed and monitored by [facility] practice and they would be the ones to advise pt what to do to modify his dosage regimen. Please advise pt to call his managing PCP with results and concerns.  Thanks.

## 2022-10-23 NOTE — Telephone Encounter (Signed)
Patient has an scheduled appointment today with Dr Laural Benes at 8:20 am. I called patient and significant other from the waiting room at 8:14 am. Proceed to walk patient to the weight station to grab his weight. Walked patient down to room 6 to proceed to get the rest of his vitals. Patient's significant other threw PHQ-9 screening and marker on the desk in the room. Begin to go through triage in patient's chart. Asked patient was he here today to follow up on his Diabetes and told by the patient that he did not have any "GD Diabetes and when did he acquire diabetes at home?" Proceed to placed the Pulse Oxygen on patient's right index finger and was unable to read his oxygen level at first and then the oxygen level showed 86%. Patient was notified of reading. Blood Pressure was then taken on patient and his reading was 109/65 Pulse 78. Patient significant other then proceeded to ask for my name as she did not like my attitude. I informed the patient's significant other my name and let her know "I did not feel well and doing the best that I can and she says she doesn't like my attitude." After triaging the patient. I walked to Dr Henriette Combs office to present the patient and visit. Dr Laural Benes wanted patient to go to the lab to have labs drawn. Patient was sent to lab, where he and significant other was ugly with the lab tech and then left the office without being seen by the provider.

## 2022-10-23 NOTE — Telephone Encounter (Signed)
Patient came in with concerns of INR results 3.7 what can help bring in down, eating Greens please assist

## 2022-10-23 NOTE — Progress Notes (Signed)
Patient presented today for follow up. Was directed to the lab prior to seeing provider. Patient left prior to being seen. He was called to return for his appointment and declined to come back. Not seen today.

## 2022-10-24 ENCOUNTER — Other Ambulatory Visit: Payer: Self-pay | Admitting: Internal Medicine

## 2022-10-24 ENCOUNTER — Other Ambulatory Visit: Payer: Self-pay | Admitting: Family Medicine

## 2022-10-24 ENCOUNTER — Other Ambulatory Visit: Payer: Self-pay | Admitting: Nurse Practitioner

## 2022-10-24 LAB — CBC WITH DIFFERENTIAL/PLATELET
Basophils Absolute: 0.1 10*3/uL (ref 0.0–0.2)
Basos: 1 %
EOS (ABSOLUTE): 0.2 10*3/uL (ref 0.0–0.4)
Eos: 3 %
Hematocrit: 44.5 % (ref 37.5–51.0)
Hemoglobin: 14.1 g/dL (ref 13.0–17.7)
Immature Grans (Abs): 0 10*3/uL (ref 0.0–0.1)
Immature Granulocytes: 0 %
Lymphocytes Absolute: 1.6 10*3/uL (ref 0.7–3.1)
Lymphs: 21 %
MCH: 28.1 pg (ref 26.6–33.0)
MCHC: 31.7 g/dL (ref 31.5–35.7)
MCV: 89 fL (ref 79–97)
Monocytes Absolute: 0.8 10*3/uL (ref 0.1–0.9)
Monocytes: 10 %
Neutrophils Absolute: 5.1 10*3/uL (ref 1.4–7.0)
Neutrophils: 65 %
Platelets: 247 10*3/uL (ref 150–450)
RBC: 5.01 x10E6/uL (ref 4.14–5.80)
RDW: 14.4 % (ref 11.6–15.4)
WBC: 7.7 10*3/uL (ref 3.4–10.8)

## 2022-10-24 LAB — COMPREHENSIVE METABOLIC PANEL
ALT: 19 IU/L (ref 0–44)
AST: 25 IU/L (ref 0–40)
Albumin/Globulin Ratio: 1.6 (ref 1.2–2.2)
Albumin: 4.6 g/dL (ref 3.9–4.9)
Alkaline Phosphatase: 47 IU/L (ref 44–121)
BUN/Creatinine Ratio: 19 (ref 10–24)
BUN: 25 mg/dL (ref 8–27)
Bilirubin Total: 0.3 mg/dL (ref 0.0–1.2)
CO2: 22 mmol/L (ref 20–29)
Calcium: 9.7 mg/dL (ref 8.6–10.2)
Chloride: 102 mmol/L (ref 96–106)
Creatinine, Ser: 1.35 mg/dL — ABNORMAL HIGH (ref 0.76–1.27)
Globulin, Total: 2.9 g/dL (ref 1.5–4.5)
Glucose: 107 mg/dL — ABNORMAL HIGH (ref 70–99)
Potassium: 4.5 mmol/L (ref 3.5–5.2)
Sodium: 139 mmol/L (ref 134–144)
Total Protein: 7.5 g/dL (ref 6.0–8.5)
eGFR: 57 mL/min/{1.73_m2} — ABNORMAL LOW (ref >59)

## 2022-10-24 LAB — LIPID PANEL W/O CHOL/HDL RATIO
Cholesterol, Total: 133 mg/dL (ref 100–199)
HDL: 48 mg/dL (ref >39)
LDL Chol Calc (NIH): 52 mg/dL (ref 0–99)
Triglycerides: 201 mg/dL — ABNORMAL HIGH (ref 0–149)
VLDL Cholesterol Cal: 33 mg/dL (ref 5–40)

## 2022-10-24 LAB — TSH: TSH: 1.39 u[IU]/mL (ref 0.450–4.500)

## 2022-10-24 LAB — PSA: Prostate Specific Ag, Serum: 0.2 ng/mL (ref 0.0–4.0)

## 2022-10-24 LAB — URIC ACID: Uric Acid: 5.7 mg/dL (ref 3.8–8.4)

## 2022-10-25 ENCOUNTER — Ambulatory Visit (INDEPENDENT_AMBULATORY_CARE_PROVIDER_SITE_OTHER): Payer: 59 | Admitting: Family Medicine

## 2022-10-25 DIAGNOSIS — Z952 Presence of prosthetic heart valve: Secondary | ICD-10-CM

## 2022-10-25 LAB — PROTIME-INR: INR: 2.8 — AB (ref 0.80–1.20)

## 2022-10-25 NOTE — Telephone Encounter (Signed)
Requested Prescriptions  Pending Prescriptions Disp Refills   metFORMIN (GLUCOPHAGE-XR) 500 MG 24 hr tablet [Pharmacy Med Name: metFORMIN HCl ER 500 MG Oral Tablet Extended Release 24 Hour] 90 tablet 0    Sig: Take 1 tablet by mouth once daily with breakfast     Endocrinology:  Diabetes - Biguanides Failed - 10/24/2022  6:26 PM      Failed - Cr in normal range and within 360 days    Creatinine  Date Value Ref Range Status  05/25/2014 0.85 0.60 - 1.30 mg/dL Final   Creatinine, Ser  Date Value Ref Range Status  10/23/2022 1.35 (H) 0.76 - 1.27 mg/dL Final         Failed - eGFR in normal range and within 360 days    EGFR (African American)  Date Value Ref Range Status  05/25/2014 >60 >38mL/min Final   GFR calc Af Amer  Date Value Ref Range Status  05/12/2020 64 >59 mL/min/1.73 Final    Comment:    **In accordance with recommendations from the NKF-ASN Task force,**   [facility] is in the process of updating its eGFR calculation to the   2021 CKD-EPI creatinine equation that estimates kidney function   without a race variable.    EGFR (Non-African Amer.)  Date Value Ref Range Status  05/25/2014 >60 >85mL/min Final    Comment:    eGFR values <78mL/min/1.73 m2 may be an indication of chronic kidney disease (CKD). Calculated eGFR, using the MRDR Study equation, is useful in  patients with stable renal function. The eGFR calculation will not be reliable in acutely ill patients when serum creatinine is changing rapidly. It is not useful in patients on dialysis. The eGFR calculation may not be applicable to patients at the low and high extremes of body sizes, pregnant women, and vegetarians.    GFR, Estimated  Date Value Ref Range Status  08/26/2020 51 (L) >60 mL/min Final    Comment:    (NOTE) Calculated using the CKD-EPI Creatinine Equation (2021)    eGFR  Date Value Ref Range Status  10/23/2022 57 (L) >59 mL/min/1.73 Final         Failed - B12 Level in normal range and  within 720 days    No results found for: "VITAMINB12"       Passed - HBA1C is between 0 and 7.9 and within 180 days    Hemoglobin A1C  Date Value Ref Range Status  04/19/2016 6.6  Final   HB A1C (BAYER DCA - WAIVED)  Date Value Ref Range Status  10/23/2022 5.6 4.8 - 5.6 % Final    Comment:             Prediabetes: 5.7 - 6.4          Diabetes: >6.4          Glycemic control for adults with diabetes: <7.0          Passed - Valid encounter within last 6 months    Recent Outpatient Visits           2 days ago Patient left before evaluation by physician   [facility] [place], [name], DO   1 month ago Viral upper respiratory tract infection   [place] [facility] Mecum, Oswaldo Conroy, [name]   3 months ago COVID-19   [facility] Mecum, Oswaldo Conroy, [name]   4 months ago Acute cough   [facility] [place], [place]  T, NP   4 months ago Tremor of left hand   [place] [facility] Mecum, Oswaldo Conroy, [name]       Future Appointments             In 2 months End, Cristal Deer, MD [place] [facility] - CBC within normal limits and completed in the last 12 months    WBC  Date Value Ref Range Status  10/23/2022 7.7 3.4 - 10.8 x10E3/uL Final  02/20/2019 8.2 4.0 - 10.5 K/uL Final   RBC  Date Value Ref Range Status  10/23/2022 5.01 4.14 - 5.80 x10E6/uL Final  02/20/2019 4.73 4.22 - 5.81 MIL/uL Final   Hemoglobin  Date Value Ref Range Status  10/23/2022 14.1 13.0 - 17.7 g/dL Final   Hematocrit  Date Value Ref Range Status  10/23/2022 44.5 37.5 - 51.0 % Final   MCHC  Date Value Ref Range Status  10/23/2022 31.7 31.5 - 35.7 g/dL Final  16/04/9603 54.0 30.0 - 36.0 g/dL Final   [facility] [place]  Date Value Ref Range Status  10/23/2022 28.1 26.6 - 33.0 pg Final  02/20/2019 29.8 26.0 - 34.0 pg Final   MCV  Date Value Ref Range Status  10/23/2022 89 79 - 97 fL Final   05/25/2014 88 80 - 100 fL Final   No results found for: "PLTCOUNTKUC", "LABPLAT", "POCPLA" RDW  Date Value Ref Range Status  10/23/2022 14.4 11.6 - 15.4 % Final  05/25/2014 13.9 11.5 - 14.5 % Final          warfarin (COUMADIN) 7.5 MG tablet [Pharmacy Med Name: Warfarin Sodium 7.5 MG Oral Tablet] 90 tablet 0    Sig: TAKE  BY MOUTH FOR 2 DAYS, THEN TAKE 7.5MG  ON THE THIRD DAY, THEN REPEAT     Hematology:  Anticoagulants - warfarin Failed - 10/24/2022  6:26 PM      Failed - Manual Review: If patient's warfarin is managed by Anti-Coag team, route request to them. If not, route request to the provider.      Failed - INR in normal range and within 30 days    INR  Date Value Ref Range Status  10/23/2022 3.7 (H) 0.9 - 1.1 Final  05/25/2014 1.9  Final    Comment:    INR reference interval applies to patients on anticoagulant therapy. A single INR therapeutic range for coumarins is not optimal for all indications; however, the suggested range for most indications is 2.0 - 3.0. Exceptions to the INR Reference Range may include: Prosthetic heart valves, acute myocardial infarction, prevention of myocardial infarction, and combinations of aspirin and anticoagulant. The need for a higher or lower target INR must be assessed individually. Reference: The Pharmacology and Management of the Vitamin K  antagonists: the seventh ACCP Conference on Antithrombotic and Thrombolytic Therapy. Chest.2004 Sept:126 (3suppl): L7870634. A HCT value >55% may artifactually increase the PT.  In one study,  the increase was an average of 25%. Reference:  "Effect on Routine and Special Coagulation Testing Values of Citrate Anticoagulant Adjustment in Patients with High HCT Values." American Journal of Clinical Pathology 2006;126:400-405.          Passed - HCT in normal range and within 360 days    Hematocrit  Date Value Ref Range Status  10/23/2022 44.5 37.5 - 51.0 % Final         Passed - Patient  is not pregnant  Passed - Valid encounter within last 3 months    Recent Outpatient Visits           2 days ago Patient left before evaluation by physician   [place] [facility] [place], [name], DO   1 month ago Viral upper respiratory tract infection   [place] [facility] Mecum, Oswaldo Conroy, [name]   3 months ago COVID-19   [employer], Oswaldo Conroy, [name]   4 months ago Acute cough   [place] [facility] [place], [place] T, NP   4 months ago Tremor of left hand   [place] [facility] Mecum, Oswaldo Conroy, [name]       Future Appointments             In 2 months End, Cristal Deer, MD [facility]

## 2022-10-25 NOTE — Telephone Encounter (Signed)
Requested medication (s) are due for refill today: For review  Requested medication (s) are on the active medication list: yes    Last refill: 06/13/22  #90  1 refill  Future visit scheduled No  Notes to clinic: Please review.  Note from Dr. Laural Benes 10/23/22       "Your INR this morning and at your home monitoring came back at 3.7. this is first time it has been out of range in several months. I would like you to recheck it on your home monitoring on Wednesday and continue your  rather than your  dose on Tuesday. Please let me know if you have any questions."   Requested Prescriptions  Pending Prescriptions Disp Refills   warfarin (COUMADIN) 7.5 MG tablet [Pharmacy Med Name: Warfarin Sodium 7.5 MG Oral Tablet] 90 tablet 0    Sig: TAKE  BY MOUTH FOR 2 DAYS, THEN TAKE 7.5MG  ON THE THIRD DAY, THEN REPEAT     Hematology:  Anticoagulants - warfarin Failed - 10/24/2022  6:26 PM      Failed - Manual Review: If patient's warfarin is managed by Anti-Coag team, route request to them. If not, route request to the provider.      Failed - INR in normal range and within 30 days    INR  Date Value Ref Range Status  10/23/2022 3.7 (H) 0.9 - 1.1 Final  05/25/2014 1.9  Final    Comment:    INR reference interval applies to patients on anticoagulant therapy. A single INR therapeutic range for coumarins is not optimal for all indications; however, the suggested range for most indications is 2.0 - 3.0. Exceptions to the INR Reference Range may include: Prosthetic heart valves, acute myocardial infarction, prevention of myocardial infarction, and combinations of aspirin and anticoagulant. The need for a higher or lower target INR must be assessed individually. Reference: The Pharmacology and Management of the Vitamin K  antagonists: the seventh ACCP Conference on Antithrombotic and Thrombolytic Therapy. Chest.2004 Sept:126 (3suppl): [id number]. A HCT value >55% may artifactually increase the PT.   In one study,  the increase was an average of 25%. Reference:  "Effect on Routine and Special Coagulation Testing Values of Citrate Anticoagulant Adjustment in Patients with High HCT Values." American Journal of Clinical Pathology 2006;126:400-405.          Passed - HCT in normal range and within 360 days    Hematocrit  Date Value Ref Range Status  10/23/2022 44.5 37.5 - 51.0 % Final         Passed - Patient is not pregnant      Passed - Valid encounter within last 3 months    Recent Outpatient Visits           2 days ago Patient left before evaluation by physician   [place] [facility] [place], [name], DO   1 month ago Viral upper respiratory tract infection   [place] [facility] Mecum, Oswaldo Conroy, [name]   3 months ago COVID-19   [employer] [employer], Oswaldo Conroy, [name]   4 months ago Acute cough   [place] [facility] [place], [place] T, NP   4 months ago Tremor of left hand   [place] [facility] Mecum, Oswaldo Conroy, [name]       Future Appointments             In 2 months End, Cristal Deer, MD [facility]  Signed Prescriptions Disp Refills   metFORMIN (GLUCOPHAGE-XR) 500 MG 24 hr tablet 90 tablet 0    Sig: Take 1 tablet by mouth once daily with breakfast     Endocrinology:  Diabetes - Biguanides Failed - 10/24/2022  6:26 PM      Failed - Cr in normal range and within 360 days    Creatinine  Date Value Ref Range Status  05/25/2014 0.85 0.60 - 1.30 mg/dL Final   Creatinine, Ser  Date Value Ref Range Status  10/23/2022 1.35 (H) 0.76 - 1.27 mg/dL Final         Failed - eGFR in normal range and within 360 days    EGFR (African American)  Date Value Ref Range Status  05/25/2014 >60 >80mL/min Final   GFR calc Af Amer  Date Value Ref Range Status  05/12/2020 64 >59 mL/min/1.73 Final    Comment:    **In accordance with recommendations from the NKF-ASN  Task force,**   [facility] is in the process of updating its eGFR calculation to the   2021 CKD-EPI creatinine equation that estimates kidney function   without a race variable.    EGFR (Non-African Amer.)  Date Value Ref Range Status  05/25/2014 >60 >17mL/min Final    Comment:    eGFR values <70mL/min/1.73 m2 may be an indication of chronic kidney disease (CKD). Calculated eGFR, using the MRDR Study equation, is useful in  patients with stable renal function. The eGFR calculation will not be reliable in acutely ill patients when serum creatinine is changing rapidly. It is not useful in patients on dialysis. The eGFR calculation may not be applicable to patients at the low and high extremes of body sizes, pregnant women, and vegetarians.    GFR, Estimated  Date Value Ref Range Status  08/26/2020 51 (L) >60 mL/min Final    Comment:    (NOTE) Calculated using the CKD-EPI Creatinine Equation (2021)    eGFR  Date Value Ref Range Status  10/23/2022 57 (L) >59 mL/min/1.73 Final         Failed - B12 Level in normal range and within 720 days    No results found for: "VITAMINB12"       Passed - HBA1C is between 0 and 7.9 and within 180 days    Hemoglobin A1C  Date Value Ref Range Status  04/19/2016 6.6  Final   HB A1C (BAYER DCA - WAIVED)  Date Value Ref Range Status  10/23/2022 5.6 4.8 - 5.6 % Final    Comment:             Prediabetes: 5.7 - 6.4          Diabetes: >6.4          Glycemic control for adults with diabetes: <7.0          Passed - Valid encounter within last 6 months    Recent Outpatient Visits           2 days ago Patient left before evaluation by physician   [place] [facility] [place], [name], DO   1 month ago Viral upper respiratory tract infection   [place] [facility] Mecum, Oswaldo Conroy, [name]   3 months ago COVID-19   [place] [employer], Oswaldo Conroy, [name]   4 months ago Acute cough   [place]  [facility] [place], [place] T, NP   4 months ago Tremor of left hand   [place] [facility] Mecum, Oswaldo Conroy, [name]       Future Appointments             In 2 months End, Cristal Deer, MD [place] [facility] - CBC within normal limits and completed in the last 12 months    WBC  Date Value Ref Range Status  10/23/2022 7.7 3.4 - 10.8 x10E3/uL Final  02/20/2019 8.2 4.0 - 10.5 K/uL Final   RBC  Date Value Ref Range Status  10/23/2022 5.01 4.14 - 5.80 x10E6/uL Final  02/20/2019 4.73 4.22 - 5.81 MIL/uL Final   Hemoglobin  Date Value Ref Range Status  10/23/2022 14.1 13.0 - 17.7 g/dL Final   Hematocrit  Date Value Ref Range Status  10/23/2022 44.5 37.5 - 51.0 % Final   MCHC  Date Value Ref Range Status  10/23/2022 31.7 31.5 - 35.7 g/dL Final  16/04/9603 54.0 30.0 - 36.0 g/dL Final   [facility]  Date Value Ref Range Status  10/23/2022 28.1 26.6 - 33.0 pg Final  02/20/2019 29.8 26.0 - 34.0 pg Final   MCV  Date Value Ref Range Status  10/23/2022 89 79 - 97 fL Final  05/25/2014 88 80 - 100 fL Final   No results found for: "PLTCOUNTKUC", "LABPLAT", "POCPLA" RDW  Date Value Ref Range Status  10/23/2022 14.4 11.6 - 15.4 % Final  05/25/2014 13.9 11.5 - 14.5 % Final

## 2022-10-25 NOTE — Telephone Encounter (Signed)
Requested Prescriptions  Pending Prescriptions Disp Refills   cetirizine (ZYRTEC) 10 MG tablet [Pharmacy Med Name: Cetirizine HCl 10 MG Oral Tablet] 90 tablet 0    Sig: Take 1 tablet by mouth once daily     Ear, Nose, and Throat:  Antihistamines 2 Failed - 10/24/2022  6:27 PM      Failed - Cr in normal range and within 360 days    Creatinine  Date Value Ref Range Status  05/25/2014 0.85 0.60 - 1.30 mg/dL Final   Creatinine, Ser  Date Value Ref Range Status  10/23/2022 1.35 (H) 0.76 - 1.27 mg/dL Final         Passed - Valid encounter within last 12 months    Recent Outpatient Visits           2 days ago Patient left before evaluation by physician   [place] [facility] [place], [name], DO   1 month ago Viral upper respiratory tract infection   [place] [facility] Mecum, Oswaldo Conroy, [name]   3 months ago COVID-19   [facility] [employer], Oswaldo Conroy, [name]   4 months ago Acute cough   [place] [facility] [place], [place] T, NP   4 months ago Tremor of left hand    [facility] Mecum, Oswaldo Conroy, [name]       Future Appointments             In 2 months End, Cristal Deer, MD [facility]
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
## 2022-10-28 ENCOUNTER — Other Ambulatory Visit: Payer: Self-pay | Admitting: Family Medicine

## 2022-10-28 ENCOUNTER — Other Ambulatory Visit: Payer: Self-pay | Admitting: Internal Medicine

## 2022-10-30 NOTE — Telephone Encounter (Signed)
Rx was sent to pharmacy on 06/07/23 #90/1. Pt has enough med to last to 12/06/22.  Requested Prescriptions  Pending Prescriptions Disp Refills   allopurinol (ZYLOPRIM) 300 MG tablet [Pharmacy Med Name: Allopurinol 300 MG Oral Tablet] 90 tablet 0    Sig: Take 1 tablet by mouth once daily     Endocrinology:  Gout Agents - allopurinol Failed - 10/28/2022  3:09 PM      Failed - Cr in normal range and within 360 days    Creatinine  Date Value Ref Range Status  05/25/2014 0.85 0.60 - 1.30 mg/dL Final   Creatinine, Ser  Date Value Ref Range Status  10/23/2022 1.35 (H) 0.76 - 1.27 mg/dL Final         Passed - Uric Acid in normal range and within 360 days    Uric Acid  Date Value Ref Range Status  10/23/2022 5.7 3.8 - 8.4 mg/dL Final    Comment:               Therapeutic target for gout patients: <6.0         Passed - Valid encounter within last 12 months    Recent Outpatient Visits           1 week ago Patient left before evaluation by physician   [place] [facility] [place], [name], DO   1 month ago Viral upper respiratory tract infection   [place] [facility] Mecum, Oswaldo Conroy, [name]   3 months ago COVID-19   [employer], Oswaldo Conroy, [name]   4 months ago Acute cough   [place] [facility] [place], [place] T, NP   5 months ago Tremor of left hand   [place] [facility] Mecum, Oswaldo Conroy, [name]       Future Appointments             In 1 month End, Cristal Deer, MD  [facility] at [facility] - CBC within normal limits and completed in the last 12 months    WBC  Date Value Ref Range Status  10/23/2022 7.7 3.4 - 10.8 x10E3/uL Final  02/20/2019 8.2 4.0 - 10.5 K/uL Final   RBC  Date Value Ref Range Status  10/23/2022 5.01 4.14 - 5.80 x10E6/uL Final  02/20/2019 4.73 4.22 - 5.81 MIL/uL Final   Hemoglobin  Date Value Ref Range Status  10/23/2022 14.1 13.0 - 17.7  g/dL Final   Hematocrit  Date Value Ref Range Status  10/23/2022 44.5 37.5 - 51.0 % Final   MCHC  Date Value Ref Range Status  10/23/2022 31.7 31.5 - 35.7 g/dL Final  16/04/9603 54.0 30.0 - 36.0 g/dL Final   [facility]  Date Value Ref Range Status  10/23/2022 28.1 26.6 - 33.0 pg Final  02/20/2019 29.8 26.0 - 34.0 pg Final   MCV  Date Value Ref Range Status  10/23/2022 89 79 - 97 fL Final  05/25/2014 88 80 - 100 fL Final   No results found for: "PLTCOUNTKUC", "LABPLAT", "POCPLA" RDW  Date Value Ref Range Status  10/23/2022 14.4 11.6 - 15.4 % Final  05/25/2014 13.9 11.5 - 14.5 % Final

## 2022-10-31 LAB — PROTIME-INR: INR: 2.8 — AB (ref 0.80–1.20)

## 2022-11-03 ENCOUNTER — Ambulatory Visit (INDEPENDENT_AMBULATORY_CARE_PROVIDER_SITE_OTHER): Payer: 59 | Admitting: Family Medicine

## 2022-11-03 DIAGNOSIS — Z952 Presence of prosthetic heart valve: Secondary | ICD-10-CM

## 2022-11-05 ENCOUNTER — Encounter: Payer: Self-pay | Admitting: Family Medicine

## 2022-11-08 LAB — PROTIME-INR: INR: 2.9 — AB (ref 0.80–1.20)

## 2022-11-09 ENCOUNTER — Telehealth: Payer: Self-pay | Admitting: Family Medicine

## 2022-11-09 NOTE — Telephone Encounter (Signed)
Appointment has been scheduled.

## 2022-11-09 NOTE — Telephone Encounter (Signed)
Needs appointment

## 2022-11-09 NOTE — Telephone Encounter (Signed)
Copied from CRM [id number]. Topic: General - Other >> Nov 09, 2022  8:56 AM Clide Dales wrote: Patient's friend, Selena Batten called in requesting an order be sent to [employer] for a walker. Selena Batten states that patient in unable to walk without assistance during a gout flare up. Please advise.

## 2022-11-10 ENCOUNTER — Ambulatory Visit (INDEPENDENT_AMBULATORY_CARE_PROVIDER_SITE_OTHER): Payer: 59 | Admitting: Family Medicine

## 2022-11-10 DIAGNOSIS — Z952 Presence of prosthetic heart valve: Secondary | ICD-10-CM

## 2022-11-13 ENCOUNTER — Telehealth: Payer: Self-pay

## 2022-11-13 ENCOUNTER — Ambulatory Visit (INDEPENDENT_AMBULATORY_CARE_PROVIDER_SITE_OTHER): Payer: 59 | Admitting: Family Medicine

## 2022-11-13 ENCOUNTER — Encounter: Payer: Self-pay | Admitting: Family Medicine

## 2022-11-13 VITALS — BP 91/57 | HR 65 | Temp 98.7°F | Wt 283.9 lb

## 2022-11-13 DIAGNOSIS — M1A9XX0 Chronic gout, unspecified, without tophus (tophi): Secondary | ICD-10-CM

## 2022-11-13 DIAGNOSIS — I700 Atherosclerosis of aorta: Secondary | ICD-10-CM

## 2022-11-13 DIAGNOSIS — Z23 Encounter for immunization: Secondary | ICD-10-CM

## 2022-11-13 DIAGNOSIS — E785 Hyperlipidemia, unspecified: Secondary | ICD-10-CM

## 2022-11-13 DIAGNOSIS — I5022 Chronic systolic (congestive) heart failure: Secondary | ICD-10-CM | POA: Diagnosis not present

## 2022-11-13 DIAGNOSIS — N1831 Chronic kidney disease, stage 3a: Secondary | ICD-10-CM

## 2022-11-13 DIAGNOSIS — G8929 Other chronic pain: Secondary | ICD-10-CM

## 2022-11-13 DIAGNOSIS — E1169 Type 2 diabetes mellitus with other specified complication: Secondary | ICD-10-CM

## 2022-11-13 DIAGNOSIS — I952 Hypotension due to drugs: Secondary | ICD-10-CM | POA: Diagnosis not present

## 2022-11-13 DIAGNOSIS — I2510 Atherosclerotic heart disease of native coronary artery without angina pectoris: Secondary | ICD-10-CM | POA: Diagnosis not present

## 2022-11-13 DIAGNOSIS — I7121 Aneurysm of the ascending aorta, without rupture: Secondary | ICD-10-CM

## 2022-11-13 DIAGNOSIS — J432 Centrilobular emphysema: Secondary | ICD-10-CM

## 2022-11-13 DIAGNOSIS — M1A3720 Chronic gout due to renal impairment, left ankle and foot, without tophus (tophi): Secondary | ICD-10-CM

## 2022-11-13 DIAGNOSIS — E1159 Type 2 diabetes mellitus with other circulatory complications: Secondary | ICD-10-CM

## 2022-11-13 LAB — URINALYSIS, ROUTINE W REFLEX MICROSCOPIC
Bilirubin, UA: NEGATIVE
Ketones, UA: NEGATIVE
Leukocytes,UA: NEGATIVE
Nitrite, UA: NEGATIVE
Protein,UA: NEGATIVE
RBC, UA: NEGATIVE
Specific Gravity, UA: 1.015 (ref 1.005–1.030)
Urobilinogen, Ur: 0.2 mg/dL (ref 0.2–1.0)
pH, UA: 5.5 (ref 5.0–7.5)

## 2022-11-13 LAB — MICROALBUMIN, URINE WAIVED
Creatinine, Urine Waived: 50 mg/dL (ref 10–300)
Microalb, Ur Waived: 10 mg/L (ref 0–19)

## 2022-11-13 MED ORDER — WARFARIN SODIUM 7.5 MG PO TABS: ORAL_TABLET | ORAL | 1 refills | Status: DC

## 2022-11-13 MED ORDER — METOPROLOL SUCCINATE ER 50 MG PO TB24: ORAL_TABLET | ORAL | Status: DC

## 2022-11-13 MED ORDER — TRELEGY ELLIPTA 100-62.5-25 MCG/ACT IN AEPB: INHALATION_SPRAY | RESPIRATORY_TRACT | 0 refills | Status: DC

## 2022-11-13 MED ORDER — NORTRIPTYLINE HCL 10 MG PO CAPS: 10.0000 mg | ORAL_CAPSULE | Freq: Every day | ORAL | 1 refills | Status: DC

## 2022-11-13 MED ORDER — ALLOPURINOL 300 MG PO TABS: 300.0000 mg | ORAL_TABLET | Freq: Every day | ORAL | 1 refills | Status: DC

## 2022-11-13 MED ORDER — SEMAGLUTIDE (1 MG/DOSE) 4 MG/3ML ~~LOC~~ SOPN: 1.0000 mg | PEN_INJECTOR | SUBCUTANEOUS | 6 refills | Status: DC

## 2022-11-13 NOTE — Telephone Encounter (Signed)
-----   Message from [name], [place] sent at 11/13/2022 12:14 PM EDT ----- Rx for rollator please. Dx: gout and chronic low back pain. Thanks!

## 2022-11-13 NOTE — Assessment & Plan Note (Signed)
Continue to follow with cardiology. Stable. BP running low, but don't want to adjust lasix- will cut down on his metoprolol. Continue to monitor.

## 2022-11-13 NOTE — Assessment & Plan Note (Signed)
Under good control with A1c of 5.6. Continue current regimen. Call with any concerns.  

## 2022-11-13 NOTE — Assessment & Plan Note (Signed)
Has been alternating her advair and trelegy- advised to stop advair. Continue trelegy. Call with any concerns.

## 2022-11-13 NOTE — Telephone Encounter (Signed)
DME order has been printed along with clinical notes to be placed in provider's folder for signature. Please advise?

## 2022-11-13 NOTE — Progress Notes (Signed)
BP (!) 91/57   Pulse 65   Temp 98.7 F (37.1 C) (Oral)   Wt 283 lb 14.4 oz (128.8 kg)   BMI 45.82 kg/m    Subjective:    Patient ID: Roberto [name], male    DOB: 01/02/1956, 67 y.o.   MRN: [id number]  HPI: Roberto [name] is a 67 y.o. male  Chief Complaint  Patient presents with   DME Order    Pt states he is here to discuss getting a walker. States he needs this because when he has a gout flare, he cannot walk.    Hypertension   Hyperlipidemia   Diabetes   HYPERTENSION / HYPERLIPIDEMIA Satisfied with current treatment? yes Duration of hypertension: chronic BP monitoring frequency: rarely BP medication side effects: no Past BP meds: lisinopril, lasix, metoprolol Duration of hyperlipidemia: chronic Cholesterol medication side effects: no Cholesterol supplements: none Past cholesterol medications: crestor Medication compliance: excellent compliance Aspirin: no Recent stressors: no Recurrent headaches: no Visual changes: no Palpitations: no Dyspnea: no Chest pain: no Lower extremity edema: no Dizzy/lightheaded: no  Had a gout flare last week, Unsure if he dropped his allopurinol dose from 300mg  to 100mg . He notes that he has a lot of trouble walking when he has a gout flare and needs a walker.   DIABETES Hypoglycemic episodes:no Polydipsia/polyuria: no Visual disturbance: no Chest pain: no Paresthesias: no Glucose Monitoring: no  Accucheck frequency: Not Checking Taking Insulin?: no Blood Pressure Monitoring: rarely Retinal Examination: Not up to Date Foot Exam: Up to Date Diabetic Education: Completed Pneumovax: Up to Date Influenza: Up to Date Aspirin: no  COPD COPD status: controlled Satisfied with current treatment?: yes Oxygen use: no Dyspnea frequency: rarely Cough frequency: rarely Rescue inhaler frequency: rarely   Limitation of activity: no Productive cough: no Pneumovax: Up to Date Influenza: Up to Date   Relevant past medical,  surgical, family and social history reviewed and updated as indicated. Interim medical history since our last visit reviewed. Allergies and medications reviewed and updated.  Review of Systems  Constitutional: Negative.   Respiratory: Negative.    Cardiovascular: Negative.   Gastrointestinal: Negative.   Musculoskeletal:  Positive for arthralgias. Negative for back pain, gait problem, joint swelling, myalgias, neck pain and neck stiffness.  Skin: Negative.   Neurological: Negative.   Psychiatric/Behavioral: Negative.      Per HPI unless specifically indicated above     Objective:    BP (!) 91/57   Pulse 65   Temp 98.7 F (37.1 C) (Oral)   Wt 283 lb 14.4 oz (128.8 kg)   BMI 45.82 kg/m   Wt Readings from Last 3 Encounters:  11/13/22 283 lb 14.4 oz (128.8 kg)  10/23/22 289 lb 1.6 oz (131.1 kg)  09/15/22 295 lb (133.8 kg)    Physical Exam Vitals and nursing note reviewed.  Constitutional:      General: He is not in acute distress.    Appearance: Normal appearance. He is obese. He is not ill-appearing, toxic-appearing or diaphoretic.  HENT:     Head: Normocephalic and atraumatic.     Right Ear: External ear normal.     Left Ear: External ear normal.     Nose: Nose normal.     Mouth/Throat:     Mouth: Mucous membranes are moist.     Pharynx: Oropharynx is clear.  Eyes:     General: No scleral icterus.       Right eye: No discharge.        Left  eye: No discharge.     Extraocular Movements: Extraocular movements intact.     Conjunctiva/sclera: Conjunctivae normal.     Pupils: Pupils are equal, round, and reactive to light.  Cardiovascular:     Rate and Rhythm: Normal rate and regular rhythm.     Pulses: Normal pulses.     Heart sounds: Normal heart sounds. No murmur heard.    No friction rub. No gallop.  Pulmonary:     Effort: Pulmonary effort is normal. No respiratory distress.     Breath sounds: Normal breath sounds. No stridor. No wheezing, rhonchi or rales.   Chest:     Chest wall: No tenderness.  Musculoskeletal:        General: Normal range of motion.     Cervical back: Normal range of motion and neck supple.  Skin:    General: Skin is warm and dry.     Capillary Refill: Capillary refill takes less than 2 seconds.     Coloration: Skin is not jaundiced or pale.     Findings: No bruising, erythema, lesion or rash.  Neurological:     General: No focal deficit present.     Mental Status: He is alert and oriented to person, place, and time. Mental status is at baseline.  Psychiatric:        Mood and Affect: Mood normal.        Behavior: Behavior normal.        Thought Content: Thought content normal.        Judgment: Judgment normal.     Results for orders placed or performed in visit on 11/13/22  Urinalysis, Routine w reflex microscopic  Result Value Ref Range   Specific Gravity, UA 1.015 1.005 - 1.030   pH, UA 5.5 5.0 - 7.5   Color, UA Yellow Yellow   Appearance Ur Clear Clear   Leukocytes,UA Negative Negative   Protein,UA Negative Negative/Trace   Glucose, UA 2+ (A) Negative   Ketones, UA Negative Negative   RBC, UA Negative Negative   Bilirubin, UA Negative Negative   Urobilinogen, Ur 0.2 0.2 - 1.0 mg/dL   Nitrite, UA Negative Negative   Microscopic Examination Comment   Microalbumin, Urine Waived  Result Value Ref Range   Microalb, Ur Waived 10 0 - 19 mg/L   Creatinine, Urine Waived 50 10 - 300 mg/dL   Microalb/Creat Ratio 30-300 (H) <30 mg/g   *Note: Due to a large number of results and/or encounters for the requested time period, some results have not been displayed. A complete set of results can be found in Results Review.      Assessment & Plan:   Problem List Items Addressed This Visit       Cardiovascular and Mediastinum   Thoracic aortic aneurysm without rupture (HCC)    Continue to follow with cardiology and vascular. Keep BP, cholesterol and sugars under good control. Continue to monitor.       Relevant  Medications   metoprolol succinate (TOPROL-XL) 50 MG 24 hr tablet   warfarin (COUMADIN) 7.5 MG tablet   Aortic atherosclerosis (HCC)    Will keep BP and cholesterol under good control. Continue to monitor. Call with any concerns.       Relevant Medications   metoprolol succinate (TOPROL-XL) 50 MG 24 hr tablet   warfarin (COUMADIN) 7.5 MG tablet   Type 2 diabetes mellitus with cardiac complication (HCC)    Under good control with A1c of 5.6. Continue current regimen. Call with any  concerns.       Relevant Medications   metoprolol succinate (TOPROL-XL) 50 MG 24 hr tablet   Semaglutide, 1 MG/DOSE, 4 MG/3ML SOPN   warfarin (COUMADIN) 7.5 MG tablet   Other Relevant Orders   Urinalysis, Routine w reflex microscopic (Completed)   Microalbumin, Urine Waived (Completed)   Chronic HFrEF (heart failure with reduced ejection fraction) (HCC)    Continue to follow with cardiology. Stable. BP running low, but don't want to adjust lasix- will cut down on his metoprolol. Continue to monitor.       Relevant Medications   metoprolol succinate (TOPROL-XL) 50 MG 24 hr tablet   warfarin (COUMADIN) 7.5 MG tablet   Coronary artery disease involving native coronary artery of native heart without angina pectoris    Continue to follow with cardiology. Stable. BP running low, but don't want to adjust lasix- will cut down on his metoprolol. Continue to monitor.       Relevant Medications   metoprolol succinate (TOPROL-XL) 50 MG 24 hr tablet   warfarin (COUMADIN) 7.5 MG tablet     Respiratory   COPD (chronic obstructive pulmonary disease) (HCC)    Has been alternating her advair and trelegy- advised to stop advair. Continue trelegy. Call with any concerns.       Relevant Medications   TRELEGY ELLIPTA 100-62.5-25 MCG/ACT AEPB     Endocrine   Hyperlipidemia associated with type 2 diabetes mellitus (HCC)    Doing great with A1c of 5.6. Continue current regimen. Call with any concerns. Refills given.  Continue crestor. Call with any concerns.       Relevant Medications   metoprolol succinate (TOPROL-XL) 50 MG 24 hr tablet   Semaglutide, 1 MG/DOSE, 4 MG/3ML SOPN   warfarin (COUMADIN) 7.5 MG tablet     Genitourinary   Chronic kidney disease, stage 3a (HCC)    Under good control last check. Continue to monitor. Call with any concerns.         Other   Morbid obesity (HCC)    Congratulated patient on 30lb weight loss! Continue to monitor. Call with any concerns.       Relevant Medications   Semaglutide, 1 MG/DOSE, 4 MG/3ML SOPN   Chronic gout without tophus    Restart 300mg  allopurinol. Will get walker for when he has a flare. Continue to monitor.       Other Visit Diagnoses     Hypotension due to drugs    -  Primary   Will cut his metoprolol in 1/2. Has follow up with cardiology in June- will leave follow up with them. Call with any concerns.   Relevant Medications   metoprolol succinate (TOPROL-XL) 50 MG 24 hr tablet   warfarin (COUMADIN) 7.5 MG tablet   Need for vaccination against Streptococcus pneumoniae       Prevnar given today. Call with any concerns.   Relevant Orders   Pneumococcal conjugate vaccine 20-valent (Prevnar 20) (Completed)        Follow up plan: Return in about 6 months (around 05/16/2023).

## 2022-11-13 NOTE — Assessment & Plan Note (Signed)
Continue to follow with cardiology and vascular. Keep BP, cholesterol and sugars under good control. Continue to monitor.

## 2022-11-13 NOTE — Assessment & Plan Note (Signed)
Congratulated patient on 30lb weight loss! Continue to monitor. Call with any concerns.

## 2022-11-13 NOTE — Assessment & Plan Note (Signed)
Will keep BP and cholesterol under good control. Continue to monitor. Call with any concerns.  

## 2022-11-13 NOTE — Assessment & Plan Note (Signed)
Under good control last check. Continue to monitor. Call with any concerns.

## 2022-11-13 NOTE — Assessment & Plan Note (Addendum)
Doing great with A1c of 5.6. Continue current regimen. Call with any concerns. Refills given. Continue crestor. Call with any concerns.

## 2022-11-13 NOTE — Assessment & Plan Note (Signed)
Restart 300mg  allopurinol. Will get walker for when he has a flare. Continue to monitor.

## 2022-11-13 NOTE — Assessment & Plan Note (Signed)
Continue to follow with cardiology. Stable. BP running low, but don't want to adjust lasix- will cut down on his metoprolol. Continue to monitor.  

## 2022-11-14 ENCOUNTER — Other Ambulatory Visit: Payer: Self-pay | Admitting: Surgery

## 2022-11-14 DIAGNOSIS — I7121 Aneurysm of the ascending aorta, without rupture: Secondary | ICD-10-CM

## 2022-11-21 ENCOUNTER — Telehealth: Payer: Self-pay

## 2022-11-21 NOTE — Telephone Encounter (Signed)
Received call from patient's friend Roberto [name] stating that [name]'s bed broke on Sunday. Caller states that she contacted the medical supply company that delivered the bed and was informed that they must complete an investigation before they will be able to provide another bed. Caller states that she contacted [facility] and was informed to have the patient's doctor fax a prescription to [facility] at Fax:  [phone] and to write URGENT on the fax so a new bed can be ordered and delivered to the patient as soon as possible.

## 2022-11-21 NOTE — Telephone Encounter (Signed)
OK to reprint previous rx for hospital bed and I'll sign and we can send as below.

## 2022-11-22 ENCOUNTER — Ambulatory Visit: Payer: Self-pay

## 2022-11-22 LAB — PROTIME-INR: INR: 3 — AB (ref 0.80–1.20)

## 2022-11-22 NOTE — Telephone Encounter (Signed)
Spoke with Roberto [name] who stated that pt fell out of bed because "bed is broken." Roberto [name] stated, "if he falls out of the bed one more time because Dr Laural Benes didn't order the bed, I am going to get a lawyer."  Roberto [name] that the order is just waiting on a signature form the doctor and that she will sign as soon as possible.  Same contact information of fax number: [phone]. See triage note regarding fall.  Sent teams message to [name] and [facility] and will send this note. Please call [name] or text via MyChart to let her know that order was sent.

## 2022-11-22 NOTE — Telephone Encounter (Signed)
Called office and spoke with [place]. Advised of call below.

## 2022-11-22 NOTE — Telephone Encounter (Signed)
Called the patient and spoke with  Ander Purpura. I informed Selena Batten that the order for the hospital bed was faxed to [facility] this morning. [name] expressed her discontent due to the fact that she felt it should have been faxed yesterday. She stated that the patient's bed broke on Sunday and she has been trying to resolve the issue since then. I informed Selena Batten our office was unaware until she contacted [place] yesterday at 4:47pm. Selena Batten stated that the medical supply company is what up set her. I explained that I understood her frustration and assured her that what the medical supply company had told her had nothing to do with [place]. I reiterated that our office is doing our best to meet the needs of the patient. I also let Selena Batten know that she cannot contact our office and continue to yell, mistreat our staff, and communicate threats going forward as it would compromise the patient/provider relationship and require the patient to obtain a new provider. She requested that I send the fax confirmation via MyChart for verification and disconnected the call.   Fax was uploaded and sent via MyChart as requested.

## 2022-11-22 NOTE — Telephone Encounter (Signed)
Recommend patient be seen in the ER due to Coumadin therapy.

## 2022-11-22 NOTE — Telephone Encounter (Signed)
Chief Complaint: rolled out of bed Symptoms: sore upper back and shoulder ans small superficial  scratch on arm, pain 3/7  Frequency: last night Pertinent Negatives: Patient denies LOC, headache Disposition: [] ED /[] Urgent Care (no appt availability in office) / [] Appointment(In office/virtual)/ []  [facility]/ [] Home Care/ [] Refused Recommended Disposition /[] [facility]/ []  Follow-up with PCP Additional Notes: please see telephone encounter.  Reason for Disposition . Small cut (scratch) or abrasion (scrape) is also present . Taking Coumadin (warfarin) or other strong blood thinner, or known bleeding disorder (e.g., thrombocytopenia)  Answer Assessment - Initial Assessment Questions 1. MECHANISM: "How did the fall happen?"     Rolled out of bed "bed is broken" 2. DOMESTIC VIOLENCE AND ELDER ABUSE SCREENING: "Did you fall because someone pushed you or tried to hurt you?" If Yes, ask: "Are you safe now?"     N/a 3. ONSET: "When did the fall happen?" (e.g., minutes, hours, or days ago)     Bed broke  4. LOCATION: "What part of the body hit the ground?" (e.g., back, buttocks, head, hips, knees, hands, head, stomach)     Belly and head on the floor - no if LOC  5. INJURY: "Did you hurt (injure) yourself when you fell?" If Yes, ask: "What did you injure? Tell me more about this?" (e.g., body area; type of injury; pain severity)"     Scratch on arm  6. PAIN: "Is there any pain?" If Yes, ask: "How bad is the pain?" (e.g., Scale 1-10; or mild,  moderate, severe)   - NONE (0): No pain   - MILD (1-3): Doesn't interfere with normal activities    - MODERATE (4-7): Interferes with normal activities or awakens from sleep    - SEVERE (8-10): Excruciating pain, unable to do any normal activities      3/10 7. SIZE: For cuts, bruises, or swelling, ask: "How large is it?" (e.g., inches or centimeters)      Small scratch arm superficial  8. PREGNANCY: "Is there any chance you are  pregnant?" "When was your last menstrual period?"     N/a 9. OTHER SYMPTOMS: "Do you have any other symptoms?" (e.g., dizziness, fever, weakness; new onset or worsening).  Shoulders and upper back  10. CAUSE: "What do you think caused the fall (or falling)?" (e.g., tripped, dizzy spell)       Bed broke- requesting a new bed will call and send message to office  Answer Assessment - Initial Assessment Questions 1. MECHANISM: "How did the injury happen?" For falls, ask: "What height did you fall from?" and "What surface did you fall against?"      bed 2. ONSET: "When did the injury happen?" (Minutes or hours ago)      Lasy night 3. NEUROLOGIC SYMPTOMS: "Was there any loss of consciousness?" "Are there any other neurological symptoms?"      No-no 4. MENTAL STATUS: "Does the person know who they are, who you are, and where they are?"      yes 5. LOCATION: "What part of the head was hit?"      *No Answer* 6. SCALP APPEARANCE: "What does the scalp look like? Is it bleeding now?" If Yes, ask: "Is it difficult to stop?"      *No Answer* 7. SIZE: For cuts, bruises, or swelling, ask: "How large is it?" (e.g., inches or centimeters)      none 8. PAIN: "Is there any pain?" If Yes, ask: "How bad is it?"  (e.g.,  Scale 1-10; or mild, moderate, severe)     No head pain  9. TETANUS: For any breaks in the skin, ask: "When was the last tetanus booster?"     *No Answer* 10. OTHER SYMPTOMS: "Do you have any other symptoms?" (e.g., neck pain, vomiting)       Upper back and shoulder pain 11. PREGNANCY: "Is there any chance you are pregnant?" "When was your last menstrual period?"       N/a  Protocols used: Falls and Falling-A-AH, Head Injury-A-AH

## 2022-11-22 NOTE — Telephone Encounter (Signed)
DME order was placed for provider and placed in provider's folder for signature.

## 2022-11-22 NOTE — Telephone Encounter (Addendum)
Called pt and LM on VM to call back to discuss disposition. Advised that since Ishaaq is on Coumadin will need to go to ED for evaluation.Pt is on blood thinner. Sending note over to PCP triage. If Selena Batten calls back will send message.

## 2022-11-22 NOTE — Telephone Encounter (Signed)
Called and spoke to [place], on [place] for patient. Luanna Cole that per Clydie Braun, patient should be seen at the ER due to falling out of the bed.   While on the phone, Selena Batten wants to know why the order for the patient's hospital bed was not faxed yesterday afternoon before we left for the day. She states that when she just spoke with Laurelyn Sickle, she called at 4:42 or 4:47 yesterday afternoon regarding this. I explained to Selena Batten that our office closes at 5 PM daily and opens the next morning at 8 AM. I explained that Dr. Laural Benes signed the order first thing this morning and then her CMA faxed the order at 8:25 AM today. Confirmation for the fax was received at 8:27 AM today. I also let Selena Batten know that Laurelyn Sickle was working on getting the order and confirmation information sent to the patient's mychart as she requested in her phone call with [place]. [name] states "Well are you sure it's going to get done, I mean it's 4:49 today and y'all are getting ready to close." I told Selena Batten that Laurelyn Sickle would get it done as soon as she could and for them to have a good evening. Call was then disconnected.

## 2022-11-23 ENCOUNTER — Ambulatory Visit (INDEPENDENT_AMBULATORY_CARE_PROVIDER_SITE_OTHER): Payer: 59 | Admitting: Family Medicine

## 2022-11-23 DIAGNOSIS — Z952 Presence of prosthetic heart valve: Secondary | ICD-10-CM

## 2022-11-24 ENCOUNTER — Telehealth: Payer: Self-pay | Admitting: Family Medicine

## 2022-11-24 NOTE — Telephone Encounter (Addendum)
Ander Purpura stated demographics were sent; everything was except the actual Rx. Needs a copy of the email that was sent with all of the forms in the email faxed to [facility] and needs an actual prescription.  It has to state that the item is a hospital bed with a mattress, bariatric (has to say bariatric so it will handle his weight) bed does not have to be electric.  Stated it is urgent because pt is sleeping on the floor. He has been on the floor for 6 days.  Attention: Gilda Crease - [phone]  Please advise.

## 2022-11-24 NOTE — Telephone Encounter (Signed)
Per Dr Laural Benes advised me to reach out to [employer]. I attempted on two different attempts. First call was at 3:00 PM and second attempt was at 3:04 PM. [facility] was closed. Requested information was signed and faxed back over to the company at [phone].

## 2022-11-24 NOTE — Telephone Encounter (Signed)
Selena Batten is calling back in to state that the fax was not received. [place] states that she will refax to [phone]

## 2022-11-24 NOTE — Telephone Encounter (Signed)
Ander Purpura friend is calling on the phone with [facility] to request an order for a bariatric mattress with  bed non electric to be sent to [facility]   Selena Batten reports that the patient has been sleeping on the phone since Sunday.  Phone [phone] Fax- [phone] & [phone]

## 2022-11-24 NOTE — Telephone Encounter (Signed)
Called and notified Roberto [name] that the order has been sent to both fax numbers that she provided. Roberto [name] wanted to confirm what was written on the prescription. I advised her that we wrote a bariatric hospital bed and mattress.

## 2022-11-28 ENCOUNTER — Telehealth: Payer: Self-pay | Admitting: Family Medicine

## 2022-11-28 NOTE — Telephone Encounter (Signed)
Ms Denzil Magnuson, is this the lady that contacted you earlier to have the clinical notes sent over?

## 2022-11-28 NOTE — Telephone Encounter (Signed)
Called and let Selena Batten know that I spoke with Aram Beecham and that the medical records have been sent in that they requested. Selena Batten states that she wants to let us know that DSS came out yesterday and just wants [place] to be aware that they may be contacting us for more information. Selena Batten states she put in a complaint against [facility] and just provided our name when DSS asked who his doctor was. She states that she told them that we took a few days to get the ball rolling with the situation but that we have been very fast with getting things done since then. Selena Batten thanked me for calling back and following up with her.

## 2022-11-28 NOTE — Telephone Encounter (Signed)
Requested Medical Records have been electronically sent  to fax number provided. And sent to [facility] a [employer].

## 2022-11-28 NOTE — Telephone Encounter (Signed)
Roberto [name] is calling back stating that orders need to be changed since pt does not have a mattress.   The new order need to be for the hospital bed with a mattress. Roberto [name] need the new order to be faxed to her: fax: [phone] Phone: [phone]

## 2022-11-28 NOTE — Telephone Encounter (Signed)
[name] patients friend called stated she need [place] to call Aram Beecham with [facility] [phone] to get all the info needed to get the patient from sleeping on the floor. Selena Batten states this has been going on too long and needs to be handled asap. Please f/u with Aram Beecham

## 2022-11-28 NOTE — Telephone Encounter (Signed)
Progress notes for the last 60 days  have been sent to [facility] at [facility]

## 2022-11-28 NOTE — Telephone Encounter (Signed)
Contacted Aram Beecham with [facility] as requested by Selena Batten. Aram Beecham states that they have requested for [place] to send the last 60 days worth of office notes. See other phone encounter from today. Requested medical records have been faxed electronically to [facility]. I advised Aram Beecham of this and she states that she will contact her colleagues and go from there. Aram Beecham states she will let us know if anything else is needed from [place].

## 2022-11-28 NOTE — Telephone Encounter (Unsigned)
Copied from CRM [id number]. Topic: General - Other >> Nov 28, 2022 11:22 AM Everette C wrote: Reason for CRM: Carollee Herter with [facility] has called to follow up on a previous request for a hospital bed  The patient has been required to provide progress notes within the last 60 days  Please provide notes to [phone] Attn Domingo Madeira has seen the request for a bariatric bed size but based on criteria the patient does not meet the weight requirements

## 2022-11-29 NOTE — Telephone Encounter (Signed)
Called and LVM for Roberto [name] asking for her to please return my call to confirm that the faxed was received back.

## 2022-11-29 NOTE — Telephone Encounter (Signed)
Called and spoke with Aram Beecham and Amy at [facility]. Aram Beecham does the billing and Amy does the intake. I explained that the order we sent last week was for a bariatric hospital bed with mattress. Aram Beecham states that the patient does not qualify for a bariatric bed because of his weight. To qualify for a bariatric bed, the patient must be at least 350 pounds. Amy states that she sent over their order form yesterday at 4:30 pm for Dr. Laural Benes to sign. I confirmed with Amy that this is the order/prescription for the hospital bed with a mattress and she stated that it is.   Will await for the form via fax. Then have Dr. Laural Benes sign and fax back to [facility] ASAP.

## 2022-11-29 NOTE — Telephone Encounter (Signed)
Copied from CRM [id number]. Topic: General - Other >> Nov 29, 2022 11:20 AM Carrielelia G wrote: Reason for CRM: Attn: [place],  per Amy she did receive the signed prescription   Thank you very much

## 2022-11-29 NOTE — Telephone Encounter (Signed)
Fax received. Form has been signed by Dr. Laural Benes and faxed back to [facility]. Will call to confirm fax was received.

## 2022-11-29 NOTE — Telephone Encounter (Signed)
See other phone encounter. Order was received per Amy.

## 2022-11-29 NOTE — Telephone Encounter (Signed)
FYI

## 2022-12-04 LAB — PROTIME-INR: INR: 2.9 — AB (ref 0.80–1.20)

## 2022-12-06 ENCOUNTER — Ambulatory Visit (INDEPENDENT_AMBULATORY_CARE_PROVIDER_SITE_OTHER): Payer: 59 | Admitting: Family Medicine

## 2022-12-06 DIAGNOSIS — Z952 Presence of prosthetic heart valve: Secondary | ICD-10-CM

## 2022-12-12 ENCOUNTER — Other Ambulatory Visit: Payer: Self-pay | Admitting: Family Medicine

## 2022-12-12 LAB — PROTIME-INR: INR: 2.9 — AB (ref 0.80–1.20)

## 2022-12-13 NOTE — Telephone Encounter (Signed)
Requested Prescriptions  Pending Prescriptions Disp Refills   VENTOLIN HFA 108 (90 Base) MCG/ACT inhaler [Pharmacy Med Name: Ventolin HFA 108 (90 Base) MCG/ACT Inhalation Aerosol Solution] 54 g 0    Sig: INHALE 2 PUFFS BY MOUTH EVERY 6 HOURS AS NEEDED FOR WHEEZING FOR SHORTNESS OF BREATH     Pulmonology:  Beta Agonists 2 Passed - 12/12/2022 12:34 PM      Passed - Last BP in normal range    BP Readings from Last 1 Encounters:  11/13/22 (!) 91/57         Passed - Last Heart Rate in normal range    Pulse Readings from Last 1 Encounters:  11/13/22 65         Passed - Valid encounter within last 12 months    Recent Outpatient Visits           1 month ago Hypotension due to drugs   [place] [facility] [place], [name], DO   1 month ago Patient left before evaluation by physician   [place] [facility] [place], [name], DO   2 months ago Viral upper respiratory tract infection   [place] [facility] Mecum, Oswaldo Conroy, [name]   5 months ago COVID-19   [employer], Oswaldo Conroy, [name]   5 months ago Acute cough   [place] [facility] [place], Corrie Dandy T, NP       Future Appointments             In 1 week End, Cristal Deer, MD [facility] [place]   In 5 months [name], DO [place] [facility], [facility]

## 2022-12-15 ENCOUNTER — Ambulatory Visit (INDEPENDENT_AMBULATORY_CARE_PROVIDER_SITE_OTHER): Payer: 59 | Admitting: Family Medicine

## 2022-12-15 DIAGNOSIS — Z952 Presence of prosthetic heart valve: Secondary | ICD-10-CM | POA: Diagnosis not present

## 2022-12-20 LAB — PROTIME-INR: INR: 2.8 — AB (ref 0.80–1.20)

## 2022-12-22 ENCOUNTER — Other Ambulatory Visit: Payer: Self-pay | Admitting: Internal Medicine

## 2022-12-22 ENCOUNTER — Other Ambulatory Visit: Payer: Self-pay | Admitting: Family Medicine

## 2022-12-22 NOTE — Progress Notes (Signed)
Follow-up Outpatient Visit Date: 12/25/2022  Primary Care Provider: [name], DO [address] [place] [address]  Chief Complaint: Follow-up HFrEF  HPI:  Roberto [name] is a 67 y.o. male with history of mechanical aortic valve replacement, non-obstructive coronary artery disease, thoracic aortic aneurysm (followed by [facility]), stroke, right renal infarct, chronic systolic heart failure (LVEF 40-45%), hyperlipidemia, obstructive sleep apnea not using CPAP, and morbid obesity, who presents for follow-up of valvular heart disease, HFrEF, and thoracic aortic aneurysm.  I last saw him in March, at which time he was feeling well from a heart standpoint and happily reported that he had lost 10 pounds.  Given borderline low blood pressure at times and occasional lightheadedness, we agreed to decrease his standing furosemide to 80 mg daily with an additional 40 mg in the afternoon only for weight gain/edema.  He subsequently saw Dr. Laural Benes, who recommended decreasing metoprolol due to soft blood pressure.  Today, Roberto [name] reports that he has been feeling fairly well.  He still has occasional orthostatic lightheadedness but has not passed out or fallen.  Overall, this has been stable compared to prior visits.  He denies chest pain and shortness of breath as well as palpitations and edema.  He is voiding a lot with his current Lasix regimen.  He is concerned that a chronic tremor seems to be getting a little bit worse.  He previously saw a neurologist but does not feel like the consultation was helpful.  He is trying to lose weight through diet and exercise.  --------------------------------------------------------------------------------------------------  Past Medical History:  Diagnosis Date   Bicuspid aortic valve    a. s/p #27 Carbomedics mechanical valve on 03/25/2010; b. on Coumadin; c. TTE 12/17: EF 40-45%, moderately dilated LV with moderate LVH, AVR well-seated with 14 mmHg gradient, peak AV  velocity 2.5 m/s, mild mitral valve thickening with mild MR, mildly dilated RV with mildly reduced contraction   Cellulitis    Chronic kidney disease    Chronic systolic CHF (congestive heart failure) (HCC)    a. R/LHC 03/2010 showed no significant CAD, LVEDP 31 mmHg, mean AoV gradient 34 mmHg at rest and 47 mmHg with dobutamine 20 mcg/kg/min, AVA 1.0 cm^2, RA 31, RV 68/25, PA 68/47, PCWP 38. PA sat 65%. CO 6.2 L/min (Fick) and 5.3 L/min (thermodilution)   Clotting [name] (HCC)    H/O mechanical aortic valve replacement 03/25/2010   a. #27 Carbomedics mechanical valve   Hypercholesterolemia    Renal infarct [facility]) 2017   Multiple right renal infarcts, likely embolic.   Stroke [facility])    TIA (transient ischemic attack) 05/2014   Past Surgical History:  Procedure Laterality Date   AORTIC VALVE REPLACEMENT     CARDIAC CATHETERIZATION  03/21/2010   No significant CAD. Severe aortic stenosis. Severely elevated left and right heart filling pressures.   CARDIAC SURGERY  2009   CHF   CARPAL TUNNEL RELEASE Left 2005   RIGHT HEART CATH AND CORONARY ANGIOGRAPHY N/A 01/28/2019   Procedure: RIGHT HEART CATH AND CORONARY ANGIOGRAPHY;  Surgeon: Yvonne Kendall, MD;  Location: [facility] INVASIVE CV LAB;  Service: Cardiovascular;  Laterality: N/A;   TONSILLECTOMY  1962    Current Meds  Medication Sig   albuterol (PROVENTIL) (2.5 MG/3ML) 0.083% nebulizer solution USE 1 VIAL IN NEBULIZER EVERY 6 HOURS AS NEEDED FOR WHEEZING FOR SHORTNESS OF BREATH   allopurinol (ZYLOPRIM) 300 MG tablet Take 1 tablet (300 mg total) by mouth daily.   aspirin 81 MG chewable  tablet Chew 81 mg by mouth daily.   cetirizine (ZYRTEC) 10 MG tablet Take 1 tablet by mouth once daily   cyclobenzaprine (FLEXERIL) 10 MG tablet Take 1 tablet (10 mg total) by mouth 3 times/day as needed-between meals & bedtime for muscle spasms.   dapagliflozin propanediol (FARXIGA) 10 MG TABS tablet Take 1 tablet (10 mg total) by mouth daily.    fenofibrate (TRICOR) 48 MG tablet Take 1 tablet (48 mg total) by mouth daily.   furosemide (LASIX) 80 MG tablet Take 1 tablet (80 mg total) by mouth daily.   lisinopril (ZESTRIL) 2.5 MG tablet Take 1 tablet (2.5 mg total) by mouth daily.   metFORMIN (GLUCOPHAGE-XR) 500 MG 24 hr tablet Take 1 tablet by mouth once daily with breakfast   metoprolol succinate (TOPROL-XL) 50 MG 24 hr tablet TAKE 1 TABLET BY MOUTH ONCE DAILY WITH  OR  IMMEDIATELY  FOLLOWING  A  MEAL   nortriptyline (PAMELOR) 10 MG capsule Take 1 capsule (10 mg total) by mouth at bedtime.   Omega-3 Fatty Acids (FISH OIL) 1000 MG CAPS Take 5,000 mg by mouth daily.   potassium chloride SA (KLOR-CON M) 20 MEQ tablet Take 1 tablet (20 mEq total) by mouth daily.   rosuvastatin (CRESTOR) 40 MG tablet Take 1 tablet (40 mg total) by mouth daily.   Semaglutide, 1 MG/DOSE, 4 MG/3ML SOPN Inject 1 mg as directed once a week.   TRELEGY ELLIPTA 100-62.5-25 MCG/ACT AEPB Inhale 1 puff by mouth once daily   VENTOLIN HFA 108 (90 Base) MCG/ACT inhaler INHALE 2 PUFFS BY MOUTH EVERY 6 HOURS AS NEEDED FOR WHEEZING FOR SHORTNESS OF BREATH   warfarin (COUMADIN) 4 MG tablet TAKE 1 TABLET BY MOUTH ONCE DAILY AT 6PM AS DIRECTED   warfarin (COUMADIN) 7.5 MG tablet TAKE 4MG  BY MOUTH FOR 2 DAYS, THEN TAKE 7.5MG  ON THE THIRD DAY, THEN REPEAT    Allergies: Patient has no known allergies.  Social History   Tobacco Use   Smoking status: Former    Packs/day: 0.50    Years: 46.00    Additional pack years: 0.00    Total pack years: 23.00    Types: Cigarettes    Start date: 07/16/2017    Quit date: 05/11/2019    Years since quitting: 3.6   Smokeless tobacco: Never  Vaping Use   Vaping Use: Never used  Substance Use Topics   Alcohol use: No    Alcohol/week: 0.0 standard drinks of alcohol   Drug use: No    Family History  Problem Relation Age of Onset   Arthritis Mother    Dementia Mother    Colon cancer Mother    Arthritis Father    Diabetes Father     Stroke Father    Colon cancer Father    Heart attack Brother    Breast cancer Sister    Seizures Sister    Cancer Brother        brain   Heart disease Brother    Heart attack Brother     Review of Systems: A 12-system review of systems was performed and was negative except as noted in the HPI.  --------------------------------------------------------------------------------------------------  Physical Exam: BP 100/70 (BP Location: Right Arm)   Pulse 77   Ht 5\' 6"  (1.676 m)   Wt 273 lb 3.2 oz (123.9 kg)   SpO2 95%   BMI 44.10 kg/m  Left arm: 94/66 Right arm: 100/70  General:  NAD. Neck: No JVD or HJR. Lungs: Clear to  auscultation bilaterally without wheezes or crackles. Heart: Regular rate and rhythm with 2/6 systolic murmur. Abdomen: Soft, nontender, nondistended. Extremities: No lower extremity edema.  EKG: Normal sinus rhythm with first-degree AV block (PR interval 224 ms) PVC's, and IVCD.  Compared with prior tracing from 06/14/2022, PVCs are now present.  Lab Results  Component Value Date   WBC 7.7 10/23/2022   HGB 14.1 10/23/2022   HCT 44.5 10/23/2022   MCV 89 10/23/2022   PLT 247 10/23/2022    Lab Results  Component Value Date   NA 139 10/23/2022   K 4.5 10/23/2022   CL 102 10/23/2022   CO2 22 10/23/2022   BUN 25 10/23/2022   CREATININE 1.35 (H) 10/23/2022   GLUCOSE 107 (H) 10/23/2022   ALT 19 10/23/2022    Lab Results  Component Value Date   CHOL 133 10/23/2022   HDL 48 10/23/2022   LDLCALC 52 10/23/2022   LDLDIRECT 96.4 06/04/2019   TRIG 201 (H) 10/23/2022   CHOLHDL 4.3 08/08/2019    --------------------------------------------------------------------------------------------------  ASSESSMENT AND PLAN: Chronic HFrEF: Roberto [name] appears euvolemic on exam today.  Blood pressure remains borderline low, which recently led to de-escalation of metoprolol.  Will continue with metoprolol succinate 50 mg daily as long as his heart rate and PR  interval allow.  Continue low-dose lisinopril and dapagliflozin as well.  Continue furosemide 80 mg daily with potassium supplementation.  Coronary artery disease and hyperlipidemia associated with type 2 diabetes mellitus: No angina reported.  Continue secondary prevention with current medications.  Lipids well-controlled on last check.  Valvular heart disease and thoracic aortic aneurysm: Roberto [name] status post mechanical AVR will need to remain on indefinite warfarin.  Continue lipid and blood pressure therapy as well as close cardiac surgery follow-up for management of thoracic aortic aneurysm.  If Roberto [name] continues to lose weight, he may be a better candidate for elective thoracic aorta repair in the future.  Morbid obesity: BMI remains greater than 40 with multiple comorbidities, though I congratulated Roberto [name] on having lost over 20 pounds since our last visit in March.  I encouraged him to continue with this.  Follow-up: Return to clinic in 3 months  Yvonne Kendall, MD 12/25/2022 8:35 AM

## 2022-12-25 ENCOUNTER — Encounter: Payer: Self-pay | Admitting: Internal Medicine

## 2022-12-25 ENCOUNTER — Ambulatory Visit: Payer: 59 | Attending: Internal Medicine | Admitting: Internal Medicine

## 2022-12-25 VITALS — BP 100/70 | HR 77 | Ht 66.0 in | Wt 273.2 lb

## 2022-12-25 DIAGNOSIS — E1169 Type 2 diabetes mellitus with other specified complication: Secondary | ICD-10-CM | POA: Diagnosis not present

## 2022-12-25 DIAGNOSIS — I5022 Chronic systolic (congestive) heart failure: Secondary | ICD-10-CM

## 2022-12-25 DIAGNOSIS — Z7984 Long term (current) use of oral hypoglycemic drugs: Secondary | ICD-10-CM

## 2022-12-25 DIAGNOSIS — I7121 Aneurysm of the ascending aorta, without rupture: Secondary | ICD-10-CM | POA: Diagnosis not present

## 2022-12-25 DIAGNOSIS — E785 Hyperlipidemia, unspecified: Secondary | ICD-10-CM

## 2022-12-25 DIAGNOSIS — I2510 Atherosclerotic heart disease of native coronary artery without angina pectoris: Secondary | ICD-10-CM

## 2022-12-25 DIAGNOSIS — I428 Other cardiomyopathies: Secondary | ICD-10-CM

## 2022-12-25 DIAGNOSIS — Z952 Presence of prosthetic heart valve: Secondary | ICD-10-CM

## 2022-12-25 MED ORDER — FENOFIBRATE 48 MG PO TABS: 48.0000 mg | ORAL_TABLET | Freq: Every day | ORAL | 3 refills | Status: DC

## 2022-12-25 NOTE — Patient Instructions (Signed)
Medication Instructions:  Please verify you are taking Metoprolol Succinate 50 mg daily. If you notice you are still taking 100 mg, please decrease to 50 mg daily.   *If you need a refill on your cardiac medications before your next appointment, please call your pharmacy*   Lab Work: None ordered today   Testing/Procedures: None ordered today   Follow-Up: At [facility], you and your health needs are our priority.  As part of our continuing mission to provide you with exceptional heart care, we have created designated Provider Care Teams.  These Care Teams include your primary Cardiologist (physician) and Advanced Practice Providers (APPs -  Physician Assistants and Nurse Practitioners) who all work together to provide you with the care you need, when you need it.  We recommend signing up for the patient portal called "MyChart".  Sign up information is provided on this After Visit Summary.  MyChart is used to connect with patients for Virtual Visits (Telemedicine).  Patients are able to view lab/test results, encounter notes, upcoming appointments, etc.  Non-urgent messages can be sent to your provider as well.   To learn more about what you can do with MyChart, go to [email or web address].    Your next appointment:   3 month(s)  Provider:   You may see Yvonne Kendall, MD or one of the following Advanced Practice Providers on your designated Care Team:   Nicolasa Ducking, NP Eula Listen, [name] Cadence Fransico Michael, [name] Charlsie Quest, NP

## 2022-12-25 NOTE — Telephone Encounter (Signed)
Requested Prescriptions  Pending Prescriptions Disp Refills   metFORMIN (GLUCOPHAGE-XR) 500 MG 24 hr tablet [Pharmacy Med Name: metFORMIN HCl ER 500 MG Oral Tablet Extended Release 24 Hour] 90 tablet 1    Sig: Take 1 tablet by mouth once daily with breakfast     Endocrinology:  Diabetes - Biguanides Failed - 12/22/2022  3:02 PM      Failed - Cr in normal range and within 360 days    Creatinine  Date Value Ref Range Status  05/25/2014 0.85 0.60 - 1.30 mg/dL Final   Creatinine, Ser  Date Value Ref Range Status  10/23/2022 1.35 (H) 0.76 - 1.27 mg/dL Final         Failed - eGFR in normal range and within 360 days    EGFR (African American)  Date Value Ref Range Status  05/25/2014 >60 >31mL/min Final   GFR calc Af Amer  Date Value Ref Range Status  05/12/2020 64 >59 mL/min/1.73 Final    Comment:    **In accordance with recommendations from the NKF-ASN Task force,**   [facility] is in the process of updating its eGFR calculation to the   2021 CKD-EPI creatinine equation that estimates kidney function   without a race variable.    EGFR (Non-African Amer.)  Date Value Ref Range Status  05/25/2014 >60 >47mL/min Final    Comment:    eGFR values <22mL/min/1.73 m2 may be an indication of chronic kidney disease (CKD). Calculated eGFR, using the MRDR Study equation, is useful in  patients with stable renal function. The eGFR calculation will not be reliable in acutely ill patients when serum creatinine is changing rapidly. It is not useful in patients on dialysis. The eGFR calculation may not be applicable to patients at the low and high extremes of body sizes, pregnant women, and vegetarians.    GFR, Estimated  Date Value Ref Range Status  08/26/2020 51 (L) >60 mL/min Final    Comment:    (NOTE) Calculated using the CKD-EPI Creatinine Equation (2021)    eGFR  Date Value Ref Range Status  10/23/2022 57 (L) >59 mL/min/1.73 Final         Failed - B12 Level in normal range and  within 720 days    No results found for: "VITAMINB12"       Passed - HBA1C is between 0 and 7.9 and within 180 days    Hemoglobin A1C  Date Value Ref Range Status  04/19/2016 6.6  Final   HB A1C (BAYER DCA - WAIVED)  Date Value Ref Range Status  10/23/2022 5.6 4.8 - 5.6 % Final    Comment:             Prediabetes: 5.7 - 6.4          Diabetes: >6.4          Glycemic control for adults with diabetes: <7.0          Passed - Valid encounter within last 6 months    Recent Outpatient Visits           1 month ago Hypotension due to drugs   [place] [facility] [place], [name], DO   2 months ago Patient left before evaluation by physician   [place] [place], [name], DO   3 months ago Viral upper respiratory tract infection   [place] [facility] Mecum, Oswaldo Conroy, [name]   5 months ago COVID-19   [facility]  Mecum, Erin E, [name]   6 months ago Acute cough   [place] [facility] [place], Dorie Rank, NP       Future Appointments             Today End, Cristal Deer, MD [place] [facility] [place]   In 4 months [name], DO [place] [facility], [facility]            Passed - CBC within normal limits and completed in the last 12 months    WBC  Date Value Ref Range Status  10/23/2022 7.7 3.4 - 10.8 x10E3/uL Final  02/20/2019 8.2 4.0 - 10.5 K/uL Final   RBC  Date Value Ref Range Status  10/23/2022 5.01 4.14 - 5.80 x10E6/uL Final  02/20/2019 4.73 4.22 - 5.81 MIL/uL Final   Hemoglobin  Date Value Ref Range Status  10/23/2022 14.1 13.0 - 17.7 g/dL Final   Hematocrit  Date Value Ref Range Status  10/23/2022 44.5 37.5 - 51.0 % Final   MCHC  Date Value Ref Range Status  10/23/2022 31.7 31.5 - 35.7 g/dL Final  16/04/9603 54.0 30.0 - 36.0 g/dL Final   [facility]  Date Value Ref Range Status  10/23/2022 28.1 26.6 - 33.0 pg Final  02/20/2019 29.8 26.0 - 34.0  pg Final   MCV  Date Value Ref Range Status  10/23/2022 89 79 - 97 fL Final  05/25/2014 88 80 - 100 fL Final   No results found for: "PLTCOUNTKUC", "LABPLAT", "POCPLA" RDW  Date Value Ref Range Status  10/23/2022 14.4 11.6 - 15.4 % Final  05/25/2014 13.9 11.5 - 14.5 % Final

## 2022-12-26 ENCOUNTER — Encounter: Payer: Self-pay | Admitting: Internal Medicine

## 2022-12-28 ENCOUNTER — Ambulatory Visit (INDEPENDENT_AMBULATORY_CARE_PROVIDER_SITE_OTHER): Payer: 59 | Admitting: Family Medicine

## 2022-12-28 DIAGNOSIS — Z952 Presence of prosthetic heart valve: Secondary | ICD-10-CM | POA: Diagnosis not present

## 2022-12-30 ENCOUNTER — Other Ambulatory Visit: Payer: Self-pay | Admitting: Internal Medicine

## 2022-12-30 ENCOUNTER — Other Ambulatory Visit: Payer: Self-pay | Admitting: Family Medicine

## 2023-01-01 NOTE — Telephone Encounter (Signed)
No longer current dosing of this medication  Requested Prescriptions  Pending Prescriptions Disp Refills   allopurinol (ZYLOPRIM) 100 MG tablet [Pharmacy Med Name: Allopurinol 100 MG Oral Tablet] 90 tablet 0    Sig: Take 1 tablet by mouth once daily     Endocrinology:  Gout Agents - allopurinol Failed - 12/30/2022  8:02 AM      Failed - Cr in normal range and within 360 days    Creatinine  Date Value Ref Range Status  05/25/2014 0.85 0.60 - 1.30 mg/dL Final   Creatinine, Ser  Date Value Ref Range Status  10/23/2022 1.35 (H) 0.76 - 1.27 mg/dL Final         Passed - Uric Acid in normal range and within 360 days    Uric Acid  Date Value Ref Range Status  10/23/2022 5.7 3.8 - 8.4 mg/dL Final    Comment:               Therapeutic target for gout patients: <6.0         Passed - Valid encounter within last 12 months    Recent Outpatient Visits           1 month ago Hypotension due to drugs   [place] [facility] [name], DO   2 months ago Patient left before evaluation by physician   [place] [facility], [name], DO   3 months ago Viral upper respiratory tract infection   [place] [facility] Mecum, Oswaldo Conroy, [name]   5 months ago COVID-19   [employer], Oswaldo Conroy, [name]   6 months ago Acute cough   [place] [facility] [place], Corrie Dandy T, NP       Future Appointments             In 2 months End, Cristal Deer, MD [place] [facility] [place]   In 4 months [name], DO [place] [facility], [facility]            Passed - CBC within normal limits and completed in the last 12 months    WBC  Date Value Ref Range Status  10/23/2022 7.7 3.4 - 10.8 x10E3/uL Final  02/20/2019 8.2 4.0 - 10.5 K/uL Final   RBC  Date Value Ref Range Status  10/23/2022 5.01 4.14 - 5.80 x10E6/uL Final  02/20/2019 4.73 4.22 - 5.81 MIL/uL Final   Hemoglobin  Date  Value Ref Range Status  10/23/2022 14.1 13.0 - 17.7 g/dL Final   Hematocrit  Date Value Ref Range Status  10/23/2022 44.5 37.5 - 51.0 % Final   MCHC  Date Value Ref Range Status  10/23/2022 31.7 31.5 - 35.7 g/dL Final  16/04/9603 54.0 30.0 - 36.0 g/dL Final   [name]  Date Value Ref Range Status  10/23/2022 28.1 26.6 - 33.0 pg Final  02/20/2019 29.8 26.0 - 34.0 pg Final   MCV  Date Value Ref Range Status  10/23/2022 89 79 - 97 fL Final  05/25/2014 88 80 - 100 fL Final   No results found for: "PLTCOUNTKUC", "LABPLAT", "POCPLA" RDW  Date Value Ref Range Status  10/23/2022 14.4 11.6 - 15.4 % Final  05/25/2014 13.9 11.5 - 14.5 % Final

## 2023-01-03 ENCOUNTER — Other Ambulatory Visit: Payer: 59

## 2023-01-03 ENCOUNTER — Ambulatory Visit: Payer: 59 | Admitting: Surgery

## 2023-01-07 LAB — PROTIME-INR: INR: 3 — AB (ref 0.80–1.20)

## 2023-01-09 ENCOUNTER — Telehealth: Payer: Self-pay | Admitting: Internal Medicine

## 2023-01-09 DIAGNOSIS — Z79899 Other long term (current) drug therapy: Secondary | ICD-10-CM

## 2023-01-09 NOTE — Telephone Encounter (Signed)
[name] pt's caregiver made aware and verbalized understanding.

## 2023-01-09 NOTE — Telephone Encounter (Signed)
Pt c/o medication issue:  1. Name of Medication: potassium chloride SA (KLOR-CON M) 20 MEQ tablet spironolactone (ALDACTONE) 25 MG tablet   2. How are you currently taking this medication (dosage and times per day)? As written  3. Are you having a reaction (difficulty breathing--STAT)? No 4. What is your medication issue? Patient's friend Selena Batten called requesting to have these medications refilled. Selena Batten stated they have been trying for 3 weeks now to get both of them refilled. The spironolactone is not on the patient's current medication list.

## 2023-01-09 NOTE — Telephone Encounter (Signed)
Called pharmacy to verify patient still has K+ refills because our office sent in a 1 year supply in June 14, 2022. Spoke with Herbert Seta at pharmacy who states patient picked up a 90 day supply on 01/02/23 at 2:30 PM. Returned call to patient's caregiver Selena Batten to inform her of this and she states she will double check because she did pick up a few prescriptions that day but did not think potassium was one of them. Also informed her that spironolactone appears to have been discontinued but she states he is still taking it and has five pills left. Pharmacy tech said the last time they filled this medication was on 07/10/22 for a 90 day supply so he should have been out of it by this time. She states if he is not to be taking it any longer to please call back and let her know. Please advise on this. Thanks!

## 2023-01-09 NOTE — Telephone Encounter (Signed)
Called pt's caregiver regarding medication refill request. Per chart review, Spironolactone was discontinued in Sept due to hypotension. Caregiver stated pt never stopped medication and reported this could explain the reason for pt's low BP. She stated pt's systolic has been averaging around 80-90's. However, she stated pt is asymptomatic. She also report pt has loss 50 lbs within a 2 month timeframe.  Will forward to Dr. Okey Dupre to make aware.

## 2023-01-09 NOTE — Telephone Encounter (Signed)
I recommend holding spironolactone and continuing current dose of potassium chloride.  We should recheck a BMP in ~2 weeks.  Yvonne Kendall, MD [facility] [place]

## 2023-01-10 ENCOUNTER — Ambulatory Visit: Payer: 59 | Admitting: Surgery

## 2023-01-10 ENCOUNTER — Other Ambulatory Visit: Payer: 59

## 2023-01-15 ENCOUNTER — Ambulatory Visit (INDEPENDENT_AMBULATORY_CARE_PROVIDER_SITE_OTHER): Payer: 59 | Admitting: Family Medicine

## 2023-01-15 DIAGNOSIS — Z952 Presence of prosthetic heart valve: Secondary | ICD-10-CM | POA: Diagnosis not present

## 2023-01-24 ENCOUNTER — Other Ambulatory Visit: Payer: Self-pay

## 2023-01-24 ENCOUNTER — Emergency Department
Admission: EM | Admit: 2023-01-24 | Discharge: 2023-01-24 | Disposition: A | Discharge status: Home / Self Care | Payer: 59 | Attending: Emergency Medicine | Admitting: Emergency Medicine

## 2023-01-24 ENCOUNTER — Telehealth: Payer: Self-pay | Admitting: Internal Medicine

## 2023-01-24 ENCOUNTER — Emergency Department: Payer: 59

## 2023-01-24 DIAGNOSIS — N189 Chronic kidney disease, unspecified: Secondary | ICD-10-CM | POA: Insufficient documentation

## 2023-01-24 DIAGNOSIS — L03115 Cellulitis of right lower limb: Secondary | ICD-10-CM | POA: Insufficient documentation

## 2023-01-24 DIAGNOSIS — Z7901 Long term (current) use of anticoagulants: Secondary | ICD-10-CM | POA: Diagnosis not present

## 2023-01-24 DIAGNOSIS — Z8673 Personal history of transient ischemic attack (TIA), and cerebral infarction without residual deficits: Secondary | ICD-10-CM | POA: Insufficient documentation

## 2023-01-24 LAB — CBC
HCT: 42.6 % (ref 39.0–52.0)
Hemoglobin: 13.8 g/dL (ref 13.0–17.0)
MCH: 28.2 pg (ref 26.0–34.0)
MCHC: 32.4 g/dL (ref 30.0–36.0)
MCV: 86.9 fL (ref 80.0–100.0)
Platelets: 261 10*3/uL (ref 150–400)
RBC: 4.9 MIL/uL (ref 4.22–5.81)
RDW: 14.1 % (ref 11.5–15.5)
WBC: 9.5 10*3/uL (ref 4.0–10.5)
nRBC: 0 % (ref 0.0–0.2)

## 2023-01-24 LAB — COMPREHENSIVE METABOLIC PANEL
ALT: 13 U/L (ref 0–44)
AST: 18 U/L (ref 15–41)
Albumin: 4 g/dL (ref 3.5–5.0)
Alkaline Phosphatase: 37 U/L — ABNORMAL LOW (ref 38–126)
Anion gap: 11 (ref 5–15)
BUN: 22 mg/dL (ref 8–23)
CO2: 23 mmol/L (ref 22–32)
Calcium: 9.1 mg/dL (ref 8.9–10.3)
Chloride: 104 mmol/L (ref 98–111)
Creatinine, Ser: 1.31 mg/dL — ABNORMAL HIGH (ref 0.61–1.24)
GFR, Estimated: 59 mL/min — ABNORMAL LOW (ref >60)
Glucose, Bld: 123 mg/dL — ABNORMAL HIGH (ref 70–99)
Potassium: 3.7 mmol/L (ref 3.5–5.1)
Sodium: 138 mmol/L (ref 135–145)
Total Bilirubin: 0.6 mg/dL (ref 0.3–1.2)
Total Protein: 7.7 g/dL (ref 6.5–8.1)

## 2023-01-24 LAB — PROTIME-INR
INR: 2.5 — ABNORMAL HIGH (ref 0.8–1.2)
Prothrombin Time: 27.5 seconds — ABNORMAL HIGH (ref 11.4–15.2)

## 2023-01-24 MED ORDER — SODIUM CHLORIDE 0.9 % IV SOLN
1.0000 g | Freq: Once | INTRAVENOUS | Status: AC
  Administered 2023-01-24: 1 g via INTRAVENOUS
  Filled 2023-01-24: qty 10

## 2023-01-24 MED ORDER — CEPHALEXIN 500 MG PO CAPS: 500.0000 mg | ORAL_CAPSULE | Freq: Two times a day (BID) | ORAL | 0 refills | Status: DC

## 2023-01-24 NOTE — Telephone Encounter (Signed)
Called and spoke with Selena Batten per DPR. Patient with complaint of knee pain that started a week ago after doing yard work. Patient denies injury. Patient reports redness and swelling from knee down that started yesterday and became worse today. Denies fever. Blood pressure yesterday 94/60 and today 107/7. Heart rate 84. Patient with history of cellulitis. Patient advised to be seen at urgent care or emergency department. [name] and patient verbalized understanding.

## 2023-01-24 NOTE — ED Triage Notes (Signed)
Pt sts that he has had cellulitis before. Pt has redness to the right lower leg.

## 2023-01-24 NOTE — Telephone Encounter (Signed)
Pt c/o swelling/edema: STAT if pt has developed SOB within 24 hours  If swelling, where is the swelling located? Right leg, thigh, from his knee all the way down his leg- a lot of pain also  How much weight have you gained and in what time span?   Have you gained 2 pounds in a day or 5 pounds in a week?  Do you have a log of your daily weights (if so, list)?   Are you currently taking a fluid pill? yes  Are you currently SOB? no  Have you traveled recently in a car or plane for an extended period of time? No- patient wants to be seen today- first available was tomorrow, I made that for him- please call and triage

## 2023-01-24 NOTE — ED Notes (Signed)
Patient Alert and oriented to baseline. Stable and ambulatory to baseline. Patient verbalized understanding of the discharge instructions.  Patient belongings were taken by the patient.   

## 2023-01-24 NOTE — ED Provider Notes (Signed)
[facility] Provider Note    Event Date/Time   First MD Initiated Contact with Patient 01/24/23 1506     (approximate)  History   Chief Complaint: Wound Infection  HPI  Roberto [name] is a 67 y.o. male with a past medical history of aortic valve replacement on Coumadin, CKD, CVA, recurrent right lower extremity cellulitis who presents to the emergency department for right lower extremity redness and discomfort.  According to the patient for the past 5 to 6 days he has had worsening discomfort and redness of the right lower extremity.  Patient states it was worse today so he came to the emergency department for evaluation.  No known fever.  Patient states he has had an infection in this leg previously that cleared with antibiotics.  Physical Exam   Triage Vital Signs: ED Triage Vitals  Encounter Vitals Group     BP 01/24/23 1402 91/68     Systolic BP Percentile --      Diastolic BP Percentile --      Pulse Rate 01/24/23 1402 75     Resp 01/24/23 1402 19     Temp 01/24/23 1402 98.6 F (37 C)     Temp Source 01/24/23 1402 Oral     SpO2 01/24/23 1402 93 %     Weight 01/24/23 1404 272 lb (123.4 kg)     Height 01/24/23 1404 5\' 6"  (1.676 m)     Head Circumference --      Peak Flow --      Pain Score 01/24/23 1634 6     Pain Loc --      Pain Education --      Exclude from Growth Chart --     Most recent vital signs: Vitals:   01/24/23 1402  BP: 91/68  Pulse: 75  Resp: 19  Temp: 98.6 F (37 C)  SpO2: 93%    General: Awake, no distress.  CV:  Good peripheral perfusion.  Regular rate and rhythm  Resp:  Normal effort.  Equal breath sounds bilaterally.  Abd:  No distention.  Soft, nontender.  No rebound or guarding. Other:  Patient's right lower extremity is mildly erythematous below the knee with moderate tenderness to palpation and a mild amount of edema.  Clinically consistent with either DVT versus cellulitis.  No weepage.   ED Results /  Procedures / Treatments   RADIOLOGY  Ultrasound negative   MEDICATIONS ORDERED IN ED: Medications - No data to display   IMPRESSION / MDM / ASSESSMENT AND PLAN / ED COURSE  I reviewed the triage vital signs and the nursing notes.  Patient's presentation is most consistent with acute presentation with potential threat to life or bodily function.  Patient presents emergency department for right lower extremity discomfort and redness worsening over the last 5 to 6 days.  Exam is consistent with either cellulitis versus DVT.  Patient is anticoagulated on Coumadin however we will obtain an ultrasound as a precaution to rule out DVT.  If the ultrasound is negative for DVT we will dose IV Rocephin.  Patient's labs have resulted overall reassuring showing a reassuring chemistry with a normal CBC including normal white blood cell count and a therapeutic INR 2.5.  As long as ultrasounds negative for DVT I anticipate a dose of IV Rocephin to be discharged with Keflex.  Discussed with the patient need to follow-up with his doctor in 2 to 3 days for recheck of his Coumadin level.  I also  provided my typical return precautions for cellulitis including worsening pain redness swelling or fever.  Ultrasound negative for DVT have ordered 1 g of IV Rocephin for the patient.  Will discharge on Keflex have the patient follow-up with his doctor in 24 to 48 hours for recheck.  Discussed return precautions.  Patient and wife agreeable to plan.  FINAL CLINICAL IMPRESSION(S) / ED DIAGNOSES   Right lower extremity cellulitis  Rx / DC Orders   Keflex  Note:  This document was prepared using Dragon voice recognition software and may include unintentional dictation errors.   Minna Antis, MD 01/24/23 [phone]

## 2023-01-24 NOTE — Discharge Instructions (Signed)
Please follow-up with your doctor in 1 to 2 days for recheck/reevaluation.  Please take your antibiotics as prescribed for their entire course.  Return to the emergency department for any worsening redness pain swelling or fever, or any other symptom personally concerning to yourself.

## 2023-01-25 ENCOUNTER — Ambulatory Visit: Payer: 59 | Admitting: Internal Medicine

## 2023-01-25 LAB — PROTIME-INR: INR: 3 — AB (ref 0.80–1.20)

## 2023-01-26 ENCOUNTER — Ambulatory Visit (INDEPENDENT_AMBULATORY_CARE_PROVIDER_SITE_OTHER): Payer: 59 | Admitting: Family Medicine

## 2023-01-26 DIAGNOSIS — Z952 Presence of prosthetic heart valve: Secondary | ICD-10-CM | POA: Diagnosis not present

## 2023-01-28 ENCOUNTER — Emergency Department
Admission: EM | Admit: 2023-01-28 | Discharge: 2023-01-28 | Disposition: A | Discharge status: Home / Self Care | Payer: 59 | Attending: Emergency Medicine | Admitting: Emergency Medicine

## 2023-01-28 ENCOUNTER — Emergency Department: Payer: 59

## 2023-01-28 ENCOUNTER — Other Ambulatory Visit: Payer: Self-pay

## 2023-01-28 DIAGNOSIS — N189 Chronic kidney disease, unspecified: Secondary | ICD-10-CM | POA: Insufficient documentation

## 2023-01-28 DIAGNOSIS — M25561 Pain in right knee: Secondary | ICD-10-CM

## 2023-01-28 DIAGNOSIS — L03115 Cellulitis of right lower limb: Secondary | ICD-10-CM | POA: Diagnosis not present

## 2023-01-28 LAB — CBC WITH DIFFERENTIAL/PLATELET
Abs Immature Granulocytes: 0.02 10*3/uL (ref 0.00–0.07)
Basophils Absolute: 0.1 10*3/uL (ref 0.0–0.1)
Basophils Relative: 1 %
Eosinophils Absolute: 0.3 10*3/uL (ref 0.0–0.5)
Eosinophils Relative: 3 %
HCT: 41.9 % (ref 39.0–52.0)
Hemoglobin: 13.3 g/dL (ref 13.0–17.0)
Immature Granulocytes: 0 %
Lymphocytes Relative: 18 %
Lymphs Abs: 1.6 10*3/uL (ref 0.7–4.0)
MCH: 27.9 pg (ref 26.0–34.0)
MCHC: 31.7 g/dL (ref 30.0–36.0)
MCV: 88 fL (ref 80.0–100.0)
Monocytes Absolute: 1.1 10*3/uL — ABNORMAL HIGH (ref 0.1–1.0)
Monocytes Relative: 12 %
Neutro Abs: 5.9 10*3/uL (ref 1.7–7.7)
Neutrophils Relative %: 66 %
Platelets: 316 10*3/uL (ref 150–400)
RBC: 4.76 MIL/uL (ref 4.22–5.81)
RDW: 13.9 % (ref 11.5–15.5)
WBC: 9 10*3/uL (ref 4.0–10.5)
nRBC: 0 % (ref 0.0–0.2)

## 2023-01-28 MED ORDER — HYDROCODONE-ACETAMINOPHEN 5-325 MG PO TABS: 1.0000 | ORAL_TABLET | ORAL | 0 refills | Status: AC | PRN

## 2023-01-28 MED ORDER — CAPSAICIN 0.035 % EX CREA: 1.0000 "application " | TOPICAL_CREAM | Freq: Two times a day (BID) | CUTANEOUS | 0 refills | Status: DC

## 2023-01-28 MED ORDER — ACETAMINOPHEN 500 MG PO TABS
1000.0000 mg | ORAL_TABLET | Freq: Once | ORAL | Status: AC
  Administered 2023-01-28: 1000 mg via ORAL
  Filled 2023-01-28: qty 2

## 2023-01-28 NOTE — ED Triage Notes (Signed)
Pt to ed from home via POV for leg pain in the right side. Pt was seen 3 days ago for same and was diagnosed with cellulitis and was prescribed meds for same. He also received an ultrasound and was neg for blood clot. Pt is caox4, in no acute distress and in a wheel chair in triage. Pt has what appears to be an insect bite on the front of the right knee. Wife is being treated for lyme disease and they are curious if he also got bit by a tick.

## 2023-01-28 NOTE — ED Provider Notes (Signed)
[facility] Emergency Department Provider Note     Event Date/Time   First MD Initiated Contact with Patient 01/28/23 2021     (approximate)   History   Leg Pain (RIGHT)   HPI  Roberto [name] is a 67 y.o. male with a past medical history of [name], Roberto and recurrent cellulitis who presents to the ED with right knee pain.  Patient was seen in this ED 7/24 with same symptoms.  Diagnosed with cellulitis.  Discharged home with antibiotic Keflex.  Patient has not completed course but endorses being compliant with antibiotic use.  Patient reports pain has since increased.  Patient reports not being able to walk due to pain of his knee.  Denies fevers and chills.  Denies injury or trauma.    Physical Exam   Triage Vital Signs: ED Triage Vitals  Encounter Vitals Group     BP 01/28/23 1942 110/63     Systolic BP Percentile --      Diastolic BP Percentile --      Pulse Rate 01/28/23 1942 82     Resp 01/28/23 1942 17     Temp 01/28/23 1942 98.9 F (37.2 C)     Temp Source 01/28/23 1942 Oral     SpO2 01/28/23 1942 94 %     Weight --      Height 01/28/23 1951 5\' 6"  (1.676 m)     Head Circumference --      Peak Flow --      Pain Score 01/28/23 1950 5     Pain Loc --      Pain Education --      Exclude from Growth Chart --     Most recent vital signs: Vitals:   01/28/23 1942  BP: 110/63  Pulse: 82  Resp: 17  Temp: 98.9 F (37.2 C)  SpO2: 94%    General Awake, no distress.  HEENT NCAT. PERRL. EOMI.  CV:  Good peripheral perfusion.  RESP:  Normal effort.  ABD:  No distention.  Other:  Right knee reveals no deformity.  Mild edema over patellar tendon.  There is a mildly erythemic lump with no fluctuance.  Nonmobile.  Patient is in wheelchair.  Needs assistance with walking.  Gait is not steady.  Patient is favoring right leg.  No pitting edema noted.   ED Results / Procedures / Treatments   Labs (all labs ordered are listed, but only abnormal  results are displayed) Labs Reviewed  CBC WITH DIFFERENTIAL/PLATELET - Abnormal; Notable for the following components:      Result Value   Monocytes Absolute 1.1 (*)    All other components within normal limits     RADIOLOGY  I personally viewed and evaluated these images as part of my medical decision making, as well as reviewing the written report by the radiologist.  ED Provider Interpretation: No acute bony abnormality of right knee.   DG Knee Complete 4 Views Right  Result Date: 01/28/2023 CLINICAL DATA:  Leg pain.  Cellulitis. EXAM: RIGHT KNEE - COMPLETE 4 VIEW COMPARISON:  None Available. FINDINGS: No fracture or dislocation. Preserved joint spaces and bone mineralization. No joint effusion. There is soft tissue swelling anterior to the knee joint in the proximal tibia. No definite underlying erosive changes. Vascular calcifications are seen. If there is further concern of bone infection, MRI or bone scan may be useful for further sensitivity. IMPRESSION: Soft tissue swelling.  No joint effusion. Electronically Signed   By:  Karen Kays M.D.   On: 01/28/2023 20:27     PROCEDURES:  Critical Care performed: No  Procedures   MEDICATIONS ORDERED IN ED: Medications  acetaminophen (TYLENOL) tablet 1,000 mg (1,000 mg Oral Given 01/28/23 2210)     IMPRESSION / MDM / ASSESSMENT AND PLAN / ED COURSE  I reviewed the triage vital signs and the nursing notes.                               67 y.o. male presents to the emergency department for evaluation and treatment of right knee pain. See HPI for further details.  Differential diagnosis includes, but is not limited to joint effusion, fracture, bursitis, tendinitis, cellulitis.  Knee x-ray obtained in triage.  Physical examination reveals mildly erythemic bump over patellar tendon. Chart Reviewed.  CBC with differential ordered for comparison to check trend of WBC.  CBC is reassuring.  Patient is given Tylenol for pain.  Patient  reports having taken ibuprofen prior to arrival with no relief.  Patient is requesting for walker to help get around.  Given antalgic gait I do think this is necessary.  Patient will discharge home with prescription for Norco for pain and capsaicin cream.  I emphasized the importance of following up with orthopedics immediately for further evaluation.  RICE therapy instructions are provided for at home care.  Patient understands.  I also encouraged patient to continue Keflex and complete dose as instructed.  Patient is to follow-up with primary care for further evaluation. Patient is in satisfactory and stable condition for discharge and outpatient follow up. Patient is given ED precautions to return to the ED for any worsening or new symptoms. Patient verbalizes understanding. All questions and concerns were addressed during ED visit.    Patient's presentation is most consistent with acute complicated illness / injury requiring diagnostic workup.  FINAL CLINICAL IMPRESSION(S) / ED DIAGNOSES   Final diagnoses:  Right knee pain, unspecified chronicity  Cellulitis of right lower extremity   Rx / DC Orders   ED Discharge Orders          Ordered    Capsaicin 0.035 % CREA  2 times daily        01/28/23 2234    HYDROcodone-acetaminophen (NORCO/VICODIN) 5-325 MG tablet  Every 4 hours PRN        01/28/23 2246             Note:  This document was prepared using Dragon voice recognition software and may include unintentional dictation errors.    Romeo Apple, Teeghan Hammer A, [name] 01/29/23 1338    Phineas Semen, MD 01/29/23 [phone]

## 2023-01-30 ENCOUNTER — Other Ambulatory Visit: Payer: Self-pay | Admitting: Internal Medicine

## 2023-01-30 ENCOUNTER — Telehealth: Payer: Self-pay

## 2023-01-30 ENCOUNTER — Ambulatory Visit: Payer: Self-pay

## 2023-01-30 DIAGNOSIS — I5022 Chronic systolic (congestive) heart failure: Secondary | ICD-10-CM

## 2023-01-30 NOTE — Transitions of Care (Post Inpatient/ED Visit) (Signed)
01/30/2023  Name: Roberto [name] MRN: [id number] DOB: January 20, 1956  Today's TOC FU Call Status: Today's TOC FU Call Status:: Successful TOC FU Call Competed TOC FU Call Complete Date: 01/30/23  Transition Care Management Follow-up Telephone Call Discharge Facility: [facility]) Type of Discharge: Emergency Department Reason for ED Visit: Orthopedic Conditions How have you been since you were released from the hospital?: Same Any questions or concerns?: No  Items Reviewed: Did you receive and understand the discharge instructions provided?: Yes Medications obtained,verified, and reconciled?: Yes (Medications Reviewed) Any new allergies since your discharge?: No Dietary orders reviewed?: No Do you have support at home?: No  Medications Reviewed Today: Medications Reviewed Today     Reviewed by Pablo Ledger, CMA (Certified Medical Assistant) on 01/30/23 at 1035  Med List Status: <None>   Medication Order Taking? Sig Documenting Provider Last Dose Status Informant  albuterol (PROVENTIL) (2.5 MG/3ML) 0.083% nebulizer solution [id number] Yes USE 1 VIAL IN NEBULIZER EVERY 6 HOURS AS NEEDED FOR WHEEZING FOR SHORTNESS OF BREATH [name], [name], DO Taking Active   allopurinol (ZYLOPRIM) 300 MG tablet [id number] Yes Take 1 tablet (300 mg total) by mouth daily. Olevia Perches P, DO Taking Active   aspirin 81 MG chewable tablet [id number] Yes Chew 81 mg by mouth daily. [provider] Taking Active Care Giver  Capsaicin 0.035 % CREA [id number] Yes Apply 1 application  topically 2 (two) times daily. Romeo Apple, Myah A, [name] Taking Active   cephALEXin (KEFLEX) 500 MG capsule [id number] Yes Take 1 capsule (500 mg total) by mouth 2 (two) times daily. Minna Antis, MD Taking Active   cetirizine (ZYRTEC) 10 MG tablet [id number] Yes Take 1 tablet by mouth once daily Laural Benes, [name], DO Taking Active   cyclobenzaprine (FLEXERIL) 10 MG tablet [id number] Yes Take 1  tablet (10 mg total) by mouth 3 times/day as needed-between meals & bedtime for muscle spasms. [name], DO Taking Active Care Giver           Med Note [facility], PHILICIA R   Thu Feb 06, 2019  2:03 PM)    dapagliflozin propanediol (FARXIGA) 10 MG TABS tablet [id number] Yes Take 1 tablet by mouth once daily End, Cristal Deer, MD Taking Active   fenofibrate (TRICOR) 48 MG tablet [id number] Yes Take 1 tablet (48 mg total) by mouth daily. End, Cristal Deer, MD Taking Active   furosemide (LASIX) 80 MG tablet [id number] Yes Take 1 tablet (80 mg total) by mouth daily. End, Cristal Deer, MD Taking Active   HYDROcodone-acetaminophen (NORCO/VICODIN) 5-325 MG tablet [id number] Yes Take 1 tablet by mouth every 4 (four) hours as needed for up to 3 days for moderate pain. Romeo Apple, Myah A, [name] Taking Active   lisinopril (ZESTRIL) 2.5 MG tablet [id number] Yes Take 1 tablet (2.5 mg total) by mouth daily. End, Cristal Deer, MD Taking Active   metFORMIN (GLUCOPHAGE-XR) 500 MG 24 hr tablet [id number] Yes Take 1 tablet by mouth once daily with breakfast Laural Benes, [name], DO Taking Active   metoprolol succinate (TOPROL-XL) 50 MG 24 hr tablet [id number] Yes TAKE 1 TABLET BY MOUTH ONCE DAILY WITH  OR  IMMEDIATELY  FOLLOWING  A  MEAL [name], [name], DO Taking Active   nortriptyline (PAMELOR) 10 MG capsule [id number] Yes Take 1 capsule (10 mg total) by mouth at bedtime. Olevia Perches P, DO Taking Active   Omega-3 Fatty Acids (FISH OIL) 1000 MG CAPS [id number] Yes Take 5,000 mg by mouth daily. [provider] Taking Active  potassium chloride SA (KLOR-CON M) 20 MEQ tablet [id number] Yes Take 1 tablet (20 mEq total) by mouth daily. End, Cristal Deer, MD Taking Active   rosuvastatin (CRESTOR) 40 MG tablet [id number] Yes Take 1 tablet (40 mg total) by mouth daily. End, Cristal Deer, MD Taking Active   Semaglutide, 1 MG/DOSE, 4 MG/3ML SOPN [id number] Yes Inject 1 mg as directed once a week. Olevia Perches P, DO Taking Active    sulfamethoxazole-trimethoprim (BACTRIM DS) 800-160 MG tablet [id number] Yes Take 1 tablet by mouth 2 (two) times daily. [provider] Taking Active   TRELEGY ELLIPTA 100-62.5-25 MCG/ACT AEPB [id number] Yes Inhale 1 puff by mouth once daily [name], DO Taking Active   VENTOLIN HFA 108 (90 Base) MCG/ACT inhaler [id number] Yes INHALE 2 PUFFS BY MOUTH EVERY 6 HOURS AS NEEDED FOR WHEEZING FOR SHORTNESS OF BREATH [name], [name], DO Taking Active   warfarin (COUMADIN) 4 MG tablet [id number] Yes TAKE 1 TABLET BY MOUTH ONCE DAILY AT 6PM AS DIRECTED Olevia Perches P, DO Taking Active   warfarin (COUMADIN) 7.5 MG tablet [id number] Yes TAKE 4MG  BY MOUTH FOR 2 DAYS, THEN TAKE 7.5MG  ON THE THIRD DAY, THEN REPEAT [name], DO Taking Active   Med List Note [name], [place] 10/28/21 7616): DO NOT REFILL MEDICATIONS WITHOUT AN APPOINTMENT            Home Care and Equipment/Supplies: Were Home Health Services Ordered?: No Any new equipment or medical supplies ordered?: No  Functional Questionnaire: Do you need assistance with bathing/showering or dressing?: No Do you need assistance with meal preparation?: No Do you need assistance with eating?: No Do you have difficulty maintaining continence: No Do you need assistance with getting out of bed/getting out of a chair/moving?: No Do you have difficulty managing or taking your medications?: No  Follow up appointments reviewed: PCP Follow-up appointment confirmed?: Yes Date of PCP follow-up appointment?: 02/02/23 Follow-up Provider: Richardo Hanks, NP Specialist Hospital Follow-up appointment confirmed?: Yes Date of Specialist follow-up appointment?: 01/29/23 Follow-Up Specialty Provider:: [facility] Do you need transportation to your follow-up appointment?: No Do you understand care options if your condition(s) worsen?: Yes-patient verbalized understanding    SIGNATURE: Wilhemena Durie, CMA

## 2023-01-30 NOTE — Chronic Care Management (AMB) (Signed)
   01/30/2023  Roberto [name] September 02, 1955 [id number]   Reason for Encounter: Patient is not currently enrolled in the CCM program. CCM enrollment status changed to Previously enrolled.    [facility]/Chronic Care Management [phone]

## 2023-01-31 ENCOUNTER — Telehealth: Payer: Self-pay | Admitting: Family Medicine

## 2023-01-31 ENCOUNTER — Ambulatory Visit: Payer: 59 | Admitting: Surgery

## 2023-01-31 NOTE — Telephone Encounter (Signed)
Roberto [name] is calling in to let Dr. Laural Benes know that pt's INR is currently reading 4.8 and she wants to speak with Dr. Laural Benes regarding this.

## 2023-01-31 NOTE — Telephone Encounter (Signed)
Appointment tomorrow

## 2023-02-02 ENCOUNTER — Ambulatory Visit (INDEPENDENT_AMBULATORY_CARE_PROVIDER_SITE_OTHER): Payer: 59 | Admitting: Family Medicine

## 2023-02-02 ENCOUNTER — Encounter: Payer: Self-pay | Admitting: Family Medicine

## 2023-02-02 VITALS — BP 104/68 | HR 114 | Temp 99.3°F | Wt 268.2 lb

## 2023-02-02 DIAGNOSIS — Z7901 Long term (current) use of anticoagulants: Secondary | ICD-10-CM | POA: Diagnosis not present

## 2023-02-02 DIAGNOSIS — M25561 Pain in right knee: Secondary | ICD-10-CM | POA: Diagnosis not present

## 2023-02-02 LAB — COAGUCHEK XS/INR WAIVED
INR: 4.3 — ABNORMAL HIGH (ref 0.9–1.1)
Prothrombin Time: 51 s

## 2023-02-02 NOTE — Assessment & Plan Note (Addendum)
Chronic, uncontrolled. INR elevated today 4.3,PT 51 secs, pt currently taking antibiotics. Instructed to hold 7.5mg  dose of coumadin today, resume 4 mg dose tomorrow. F/u in 4 days.

## 2023-02-02 NOTE — Patient Instructions (Signed)
Skip coumadin dose tonight, take 4mg  Saturday and take 4 mg Sunday

## 2023-02-02 NOTE — Progress Notes (Signed)
BP 104/68   Pulse (!) 114   Temp 99.3 F (37.4 C) (Oral)   Wt 268 lb 3.2 oz (121.7 kg)   SpO2 97%   BMI 43.29 kg/m    Subjective:    Patient ID: Roberto [name], male    DOB: [birth year], 66 y.o.   MRN: [id number]  HPI: Roberto [name] is a 67 y.o. male  Chief Complaint  Patient presents with   ER Follow Up    Pt states he was at the ER 01/28/23 for knee pain, states he is currently on 2 antibiotics and pain medication   Valve Replacememt    Pt states his INR has been running high for the last 2 weeks. Most recent check was 5.2 last night. Currently alternating 4 mg and 7.5 mg. Takes 4 mg for 2 days, then 7.5 on the third day. Then repeat the cycle.    He is here today for ED follow up on 01/28/23 for right knee pain. Knee xray showed soft tissue swelling, no joint effusion. He was given Tylenol in the ED for pain relief and discharged with prescription for Norco for pain and capsaicin cream and recommended RICE therapy. He is currently on Keflex for treatment of cellulits of right knee. Today he has low grade fever, redness, swelling, and feeling as if knee is giving out. He does not notice improvement in the pain since starting the Norco. Followed up with ortho 3 days ago and was started on Bactrim for 10 days, with recommendations to follow up on Monday with heat and ice. He has 7 days left on Bactrim.   KNEE PAIN He is needing Norco every 8 hours, has tried heat wraps and ice Duration:2.5  weeks Involved knee: right, cyst on knee Mechanism of injury: unknown Location:anterior Onset: sudden Severity: 10/10  Quality:  burning and throbbing Frequency: constant Radiation: yes Aggravating factors: walking, bending, and movement  Alleviating factors: Norco and rest Status: worse Treatments attempted: Antibiotics, Norco, heat and ice Relief with NSAIDs?:  No NSAIDs Taken Weakness with weight bearing or walking: yes Sensation of giving way: yes Swelling: yes Redness:  yes Paresthesias/decreased sensation: yes Fevers: yes low grade fever  Coumadin Management.  INR was 5.2 last night since being on the Bactrim for 2 days. His readings increased from 3.0 to 5.2 The expected duration of coumadin treatment is lifelong The reason for anticoagulation is  mechanical heart valve.  Present Coumadin dose: 4mg  for 2 days and then 7.5mg  on third day.  Goal: 2.5-3.5 2.5-3.5 Excessive bruising: no Nose bleeding: no Rectal bleeding: no Eating diet with consistent amounts of foods containing Vitamin K:no Any recent antibiotic use? yes Keflex and Bactrim Relevant past medical, surgical, family and social history reviewed and updated as indicated. Interim medical history since our last visit reviewed. Allergies and medications reviewed and updated.  Review of Systems  Constitutional:  Positive for fever.  Respiratory: Negative.    Cardiovascular: Negative.   Musculoskeletal:  Positive for arthralgias, gait problem and neck pain.       Right knee pain  Skin:  Positive for wound (cyst on anterior right knee).  Hematological:  Bruises/bleeds easily.    Per HPI unless specifically indicated above     Objective:    BP 104/68   Pulse (!) 114   Temp 99.3 F (37.4 C) (Oral)   Wt 268 lb 3.2 oz (121.7 kg)   SpO2 97%   BMI 43.29 kg/m   Wt Readings from Last 3 Encounters:  02/02/23 268 lb 3.2 oz (121.7 kg)  01/24/23 272 lb (123.4 kg)  12/25/22 273 lb 3.2 oz (123.9 kg)    Physical Exam Vitals and nursing note reviewed.  Constitutional:      General: He is awake. He is not in acute distress.    Appearance: Normal appearance. He is well-developed and well-groomed. He is morbidly obese. He is not ill-appearing.  HENT:     Head: Normocephalic and atraumatic.     Right Ear: Hearing and external ear normal. No drainage.     Left Ear: Hearing and external ear normal. No drainage.     Nose: Nose normal.  Eyes:     General: Lids are normal.        Right eye: No  discharge.        Left eye: No discharge.     Conjunctiva/sclera: Conjunctivae normal.  Cardiovascular:     Rate and Rhythm: Regular rhythm. Tachycardia present.     Pulses:          Radial pulses are 2+ on the right side and 2+ on the left side.       Posterior tibial pulses are 2+ on the right side and 2+ on the left side.     Heart sounds: Normal heart sounds, S1 normal and S2 normal. No murmur heard.    No gallop.  Pulmonary:     Effort: Pulmonary effort is normal. No accessory muscle usage or respiratory distress.     Breath sounds: Normal breath sounds.  Musculoskeletal:        General: Normal range of motion.     Cervical back: Full passive range of motion without pain and normal range of motion.     Right lower leg: No edema.     Left lower leg: No edema.  Skin:    General: Skin is warm and dry.     Capillary Refill: Capillary refill takes less than 2 seconds.     Findings: Erythema present.     Comments: Cyst like bump on right knee  Neurological:     Mental Status: He is alert and oriented to person, place, and time.  Psychiatric:        Attention and Perception: Attention normal.        Mood and Affect: Mood normal.        Speech: Speech normal.        Behavior: Behavior normal. Behavior is cooperative.        Thought Content: Thought content normal.     Results for orders placed or performed in visit on 02/02/23  CoaguChek XS/INR Waived  Result Value Ref Range   INR 4.3 (H) 0.9 - 1.1   Prothrombin Time 51.0 sec   *Note: Due to a large number of results and/or encounters for the requested time period, some results have not been displayed. A complete set of results can be found in Results Review.      Assessment & Plan:   Problem List Items Addressed This Visit     Anticoagulated on Coumadin - Primary    Chronic, uncontrolled. INR elevated today 4.3,PT 51 secs, pt currently taking antibiotics. Instructed to hold 7.5mg  dose of coumadin today, resume 4 mg dose  tomorrow. F/u in 4 days.       Relevant Orders   CoaguChek XS/INR Waived (Completed)   Other Visit Diagnoses     Acute pain of right knee       Acute, ongoing. Recommend continued follow  up with ortho in 3 days. Recommend RICE, Norco, heat, and completion of antibiotic course.        Follow up plan: Return in about 4 days (around 02/06/2023) for Follow up INR and mood.

## 2023-02-05 NOTE — Telephone Encounter (Signed)
Pt called back to confirm appt in the am through mychart.   He will be ready.

## 2023-02-06 ENCOUNTER — Encounter: Payer: Self-pay | Admitting: Family Medicine

## 2023-02-06 ENCOUNTER — Telehealth (INDEPENDENT_AMBULATORY_CARE_PROVIDER_SITE_OTHER): Payer: 59 | Admitting: Family Medicine

## 2023-02-06 DIAGNOSIS — Z952 Presence of prosthetic heart valve: Secondary | ICD-10-CM

## 2023-02-06 DIAGNOSIS — Z7901 Long term (current) use of anticoagulants: Secondary | ICD-10-CM | POA: Diagnosis not present

## 2023-02-06 LAB — POCT INR: INR: 4.2 — AB (ref 0.80–1.20)

## 2023-02-06 NOTE — Assessment & Plan Note (Signed)
Chronic, ongoing. INR 4.2 today with x2 days left on Bactrim. Advised to skip 4mg  dose of Coumadin today. Retrun on Friday for recheck/follow up.

## 2023-02-06 NOTE — Progress Notes (Signed)
   This visit was completed via video due to the restrictions of the COVID-19 pandemic. All issues as above were discussed and addressed but no physical exam was performed. If it was felt that the patient should be evaluated in the office, they were directed there. The patient verbally consented to this visit. Due to the catastrophic nature of the COVID-19 pandemic, this visit was done through video contact only.","This visit was completed via video visit through MyChart due to the restrictions of the COVID-19 pandemic. All issues as above were discussed and addressed. Physical exam was done as above through visual confirmation on video through MyChart. If it was felt that the patient should be evaluated in the office, they were directed there. The patient verbally consented to this visit. Location of the patient: home Location of the provider: work Those involved with this call:  Provider:  Prescott Gum, NP CMA:  Wilhemena Durie Front Desk/Registration:  Tarry Kos   Time spent on call:  10 minutes on the phone discussing health concerns. 10 minutes total spent in review of patient's record and preparation of their chart.  Subjective:    Patient ID: Roberto [name], male    DOB: 10-14-1955, 67 y.o.   MRN: [id number]  Coumadin Management.  INR today is 4.2. He has 2 days of the Bactrim left, will finish last dose on tomorrow. He is expected to take 4 mg today, 7.5 mg tomorrow, and 4 mg Thursday, and 4 mg Friday.  The expected duration of coumadin treatment is lifelong. The reason for anticoagulation is  mechanical heart valve.  Present Coumadin dose: 4mg  for 2 days and then 7.5mg  on third day.  Goal: 2.5-3.5 2.5-3.5  Excessive bruising: no Nose bleeding: no Rectal bleeding: no Eating diet with consistent amounts of foods containing Vitamin K:no Any recent antibiotic use? yes Bactrim, has 2 more days left; will follow up with ortho next Tuesday   Relevant past medical, surgical,  family and social history reviewed and updated as indicated. Interim medical history since our last visit reviewed. Allergies and medications reviewed and updated.  Review of Systems  Constitutional:  Negative for fever.  Respiratory: Negative.    Cardiovascular: Negative.   Hematological:  Bruises/bleeds easily.   Per HPI unless specifically indicated above     Objective:    There were no vitals taken for this visit.  Wt Readings from Last 3 Encounters:  02/02/23 268 lb 3.2 oz (121.7 kg)  01/24/23 272 lb (123.4 kg)  12/25/22 273 lb 3.2 oz (123.9 kg)     Results for orders placed or performed in visit on 02/06/23  POCT INR  Result Value Ref Range   INR 4.20 (A) 0.80 - 1.20   *Note: Due to a large number of results and/or encounters for the requested time period, some results have not been displayed. A complete set of results can be found in Results Review.      Assessment & Plan:   Problem List Items Addressed This Visit     Anticoagulated on Coumadin - Primary    Chronic, ongoing. INR 4.2 today with x2 days left on Bactrim. Advised to skip 4mg  dose of Coumadin today. Retrun on Friday for recheck/follow up.          Follow up plan: Return in about 3 days (around 02/09/2023) for INR/PT recheck.

## 2023-02-07 LAB — POCT INR

## 2023-02-09 ENCOUNTER — Telehealth (INDEPENDENT_AMBULATORY_CARE_PROVIDER_SITE_OTHER): Payer: 59 | Admitting: Family Medicine

## 2023-02-09 DIAGNOSIS — Z7901 Long term (current) use of anticoagulants: Secondary | ICD-10-CM

## 2023-02-09 LAB — POCT INR: INR: 4 — AB (ref 0.80–1.20)

## 2023-02-09 NOTE — Progress Notes (Signed)
   This visit was completed via video due to the restrictions of the COVID-19 pandemic. All issues as above were discussed and addressed but no physical exam was performed. If it was felt that the patient should be evaluated in the office, they were directed there. The patient verbally consented to this visit. Due to the catastrophic nature of the COVID-19 pandemic, this visit was done through video contact only.","This visit was completed via video visit through MyChart due to the restrictions of the COVID-19 pandemic. All issues as above were discussed and addressed. Physical exam was done as above through visual confirmation on video through MyChart. If it was felt that the patient should be evaluated in the office, they were directed there. The patient verbally consented to this visit. Location of the patient: home Location of the provider: work Those involved with this call:  Provider:  Prescott Gum, NP CMA:  Wilhemena Durie Front Desk/Registration:  Tarry Kos   Time spent on call:  10 minutes on the phone discussing health concerns. 10 minutes total spent in review of patient's record and preparation of their chart.  Subjective:    Patient ID: Roberto [name], male    DOB: 09-23-1955, 67 y.o.   MRN: [id number]  Coumadin Management.   INR today is 4.0, down from yesterdays 4.4. He completed Bactrim yesterday. He is expected to take Coumadin 4 mg today, 7.5 mg tomorrow, and 4 mg Sunday, and Monday. This week dosages included: Monday 4mg , Tuesday 4mg , Wednesday 4mg  Thursday 7.5mg .  The expected duration of coumadin treatment is lifelong. The reason for anticoagulation is  mechanical heart valve.  Present Coumadin dose: 4mg  for 2 days and then 7.5mg  on third day.  Goal: 2.5-3.5 2.5-3.5  Excessive bruising: no Nose bleeding: no Rectal bleeding: no Eating diet with consistent amounts of foods containing Vitamin K:no Any recent antibiotic use? yes Bactrim, completed  yesterday  Relevant past medical, surgical, family and social history reviewed and updated as indicated. Interim medical history since our last visit reviewed. Allergies and medications reviewed and updated.  Review of Systems  Constitutional:  Negative for fever.  Respiratory: Negative.    Cardiovascular: Negative.   Hematological:  Bruises/bleeds easily.   Per HPI unless specifically indicated above     Objective:    There were no vitals taken for this visit.  Wt Readings from Last 3 Encounters:  02/02/23 268 lb 3.2 oz (121.7 kg)  01/24/23 272 lb (123.4 kg)  12/25/22 273 lb 3.2 oz (123.9 kg)     Results for orders placed or performed in visit on 02/09/23  POCT INR  Result Value Ref Range   INR 4.00 (A) 0.80 - 1.20   *Note: Due to a large number of results and/or encounters for the requested time period, some results have not been displayed. A complete set of results can be found in Results Review.      Assessment & Plan:   Problem List Items Addressed This Visit     Anticoagulated on Coumadin - Primary    Chronic, uncontrolled. INR 4.0 today, recommend take 4 MG each night for the next 6 days (Thursday) and return for recheck visit. INR goal 2.5-3.5.            Follow up plan: Return in about 6 days (around 02/15/2023) for Coumadin recheck.

## 2023-02-09 NOTE — Assessment & Plan Note (Addendum)
Chronic, uncontrolled. INR 4.0 today, recommend take 4 MG each night for the next 6 days (Thursday) and return for recheck visit. INR goal 2.5-3.5.

## 2023-02-09 NOTE — Addendum Note (Signed)
Addended by: Prescott Gum on: 02/09/2023 04:57 PM   Modules accepted: Level of Service

## 2023-02-13 ENCOUNTER — Telehealth: Payer: 59 | Admitting: Family Medicine

## 2023-02-14 ENCOUNTER — Encounter: Payer: Self-pay | Admitting: Surgery

## 2023-02-14 ENCOUNTER — Ambulatory Visit (INDEPENDENT_AMBULATORY_CARE_PROVIDER_SITE_OTHER): Payer: 59 | Admitting: Surgery

## 2023-02-14 ENCOUNTER — Ambulatory Visit
Admission: RE | Admit: 2023-02-14 | Discharge: 2023-02-14 | Disposition: A | Discharge status: Home / Self Care | Payer: 59 | Source: Ambulatory Visit | Attending: Surgery | Admitting: Surgery

## 2023-02-14 VITALS — BP 105/61 | HR 70 | Resp 20 | Ht 66.0 in | Wt 266.0 lb

## 2023-02-14 DIAGNOSIS — I7121 Aneurysm of the ascending aorta, without rupture: Secondary | ICD-10-CM

## 2023-02-14 NOTE — Progress Notes (Signed)
HPI:  The patient is a 67 year old gentleman with a history of mechanical aortic valve replacement with a 27 mm [employer] valve on 03/25/2010 in [place] for bicuspid aortic valve disease.  He said that the surgery was done urgently.  It is not known if he had an aortic aneurysm at that time but he had been followed by Dr. Lorayne Marek in our practice with a 5.7 cm fusiform ascending aortic aneurysm. It was felt that he would be very high risk for redo sternotomy and replacement of his ascending aorta due to a history of morbid obesity, nonobstructive coronary disease, previous stroke, embolic right renal infarct, stage III chronic kidney disease, chronic systolic congestive heart failure with ejection fraction of 40 to 45%, OSA not on CPAP, and hyperlipidemia.  I last saw him in the office on 12/14/2021 and CTA of the chest at that time showed a 5.6 cm fusiform ascending aortic aneurysm.  This has gradually increased in size since 2017 but remained stable over the past few years.  I last saw him on 06/21/2022 and CT of the chest at that time showed a measurement of the ascending aorta at 5.5 x 5.5 cm.  He has continued to feel well overall.  He recently had some cellulitis of his right lower leg which was treated with antibiotics and resolved.  Current Outpatient Medications  Medication Sig Dispense Refill   albuterol (PROVENTIL) (2.5 MG/3ML) 0.083% nebulizer solution USE 1 VIAL IN NEBULIZER EVERY 6 HOURS AS NEEDED FOR WHEEZING FOR SHORTNESS OF BREATH 180 mL 1   allopurinol (ZYLOPRIM) 300 MG tablet Take 1 tablet (300 mg total) by mouth daily. 90 tablet 1   aspirin 81 MG chewable tablet Chew 81 mg by mouth daily.     Capsaicin 0.035 % CREA Apply 1 application  topically 2 (two) times daily. 30 g 0   cetirizine (ZYRTEC) 10 MG tablet Take 1 tablet by mouth once daily 90 tablet 0   cyclobenzaprine (FLEXERIL) 10 MG tablet Take 1 tablet (10 mg total) by mouth 3 times/day as needed-between meals & bedtime  for muscle spasms. 180 tablet 0   dapagliflozin propanediol (FARXIGA) 10 MG TABS tablet Take 1 tablet by mouth once daily 90 tablet 0   fenofibrate (TRICOR) 48 MG tablet Take 1 tablet (48 mg total) by mouth daily. 90 tablet 3   furosemide (LASIX) 80 MG tablet Take 1 tablet (80 mg total) by mouth daily. 90 tablet 3   lisinopril (ZESTRIL) 2.5 MG tablet Take 1 tablet (2.5 mg total) by mouth daily. 90 tablet 3   metFORMIN (GLUCOPHAGE-XR) 500 MG 24 hr tablet Take 1 tablet by mouth once daily with breakfast 90 tablet 1   metoprolol succinate (TOPROL-XL) 100 MG 24 hr tablet Take 100 mg by mouth daily.     nortriptyline (PAMELOR) 10 MG capsule Take 1 capsule (10 mg total) by mouth at bedtime. 90 capsule 1   Omega-3 Fatty Acids (FISH OIL) 1000 MG CAPS Take 5,000 mg by mouth daily.     potassium chloride SA (KLOR-CON M) 20 MEQ tablet Take 1 tablet (20 mEq total) by mouth daily. 90 tablet 3   rosuvastatin (CRESTOR) 40 MG tablet Take 1 tablet (40 mg total) by mouth daily. 90 tablet 3   Semaglutide, 1 MG/DOSE, 4 MG/3ML SOPN Inject 1 mg as directed once a week. 3 mL 6   TRELEGY ELLIPTA 100-62.5-25 MCG/ACT AEPB Inhale 1 puff by mouth once daily 180 each 0   VENTOLIN HFA 108 (  90 Base) MCG/ACT inhaler INHALE 2 PUFFS BY MOUTH EVERY 6 HOURS AS NEEDED FOR WHEEZING FOR SHORTNESS OF BREATH 54 g 0   warfarin (COUMADIN) 4 MG tablet TAKE 1 TABLET BY MOUTH ONCE DAILY AT 6PM AS DIRECTED 180 tablet 3   warfarin (COUMADIN) 7.5 MG tablet TAKE 4MG  BY MOUTH FOR 2 DAYS, THEN TAKE 7.5MG  ON THE THIRD DAY, THEN REPEAT 90 tablet 1   HYDROcodone-acetaminophen (NORCO/VICODIN) 5-325 MG tablet Take 1 tablet by mouth.     No current facility-administered medications for this visit.     Physical Exam: BP 105/61   Pulse 70   Resp 20   Ht 5\' 6"  (1.676 m)   Wt 266 lb (120.7 kg)   SpO2 96% Comment: RA  BMI 42.93 kg/m  He looks well. Cardiac exam shows a regular rate and rhythm with crisp mechanical valve click.  There is no  murmur. Lungs are clear. There is no peripheral edema.  His right lower extremity cellulitis is resolved.  Diagnostic Tests:  Narrative & Impression  CLINICAL DATA:  Follow-up of thoracic aortic aneurysm status post aortic valve replacement   EXAM: CT CHEST WITHOUT CONTRAST   TECHNIQUE: Multidetector CT imaging of the chest was performed following the standard protocol without IV contrast.   RADIATION DOSE REDUCTION: This exam was performed according to the departmental dose-optimization program which includes automated exposure control, adjustment of the mA and/or kV according to patient size and/or use of iterative reconstruction technique.   COMPARISON:  CT chest dated 06/21/2022   FINDINGS: Cardiovascular: Status post aortic valve replacement. Normal heart size. No significant pericardial fluid/thickening. Ascending thoracic aorta measures 5.9 x 5.7 cm (101:144), previously 5.7 x 5.7 cm (remeasured). Aortic arch measures 3.5 x 3.3 cm, unchanged. Descending thoracic aorta measures 3.3 x 3.2 cm, unchanged. Coronary artery calcifications and aortic atherosclerosis.   Mediastinum/Nodes: Imaged thyroid gland without nodules meeting criteria for imaging follow-up by size. Normal esophagus. No pathologically enlarged axillary, supraclavicular, mediastinal, or hilar lymph nodes.   Lungs/Pleura: The central airways are patent. New 6 x 6 mm right upper lobe ground-glass nodule (8:61) and 7 x 3 mm mildly irregular left upper lobe nodule (8:44). Unchanged 7 x 5 mm left upper lobe nodule (8:53) and thin walled cyst in the right lower lobe. No focal consolidation. No pneumothorax. No pleural effusion.   Upper abdomen: Unchanged scattered hepatic hypodensities, measuring up to 2.0 cm (2:131) in segment 8, likely cysts.   Musculoskeletal: No acute or abnormal lytic or blastic osseous lesions. Median sternotomy wires are nondisplaced. Multilevel degenerative changes of the thoracic  spine. Scattered subcutaneous nodules in the anterior lower chest/epigastric regions may be related to medication injection. Unchanged subcutaneous lipoma overlying the left scapula measuring 5.6 x 2.3 cm (2:30).   IMPRESSION: 1. Status post aortic valve replacement. Similar ascending thoracic aortic aneurysm measuring up to 5.9 cm, previously 5.7 cm. Recommend semi-annual imaging followup by CTA or MRA and referral to cardiothoracic surgery if not already obtained. This recommendation follows 2010 ACCF/AHA/AATS/ACR/ASA/SCA/SCAI/SIR/STS/SVM Guidelines for the Diagnosis and Management of Patients With Thoracic Aortic Disease. Circulation. 2010; 121: [id number]. Aortic aneurysm NOS (ICD10-I71.9) 2. New 6 x 6 mm right upper lobe ground-glass nodule and 7 x 3 mm mildly irregular left upper lobe nodule, likely infectious/inflammatory. Non-contrast chest CT at 3-6 months is recommended. If nodules persist, subsequent management will be based upon the most suspicious nodule(s). This recommendation follows the consensus statement: Guidelines for Management of Incidental Pulmonary Nodules Detected on CT Images: From  the Fleischner Society 2017; Radiology 2017; [phone]. 3. Aortic Atherosclerosis (ICD10-I70.0). Coronary artery calcifications. Assessment for potential risk factor modification, dietary therapy or pharmacologic therapy may be warranted, if clinically indicated.     Electronically Signed   By: Agustin Cree M.D.   On: 02/14/2023 08:43    Impression:  I have personally reviewed his CT scan of the chest today and measured his aneurysm myself.  By my measurement it is 5.7 x 5.7 cm in maximum diameter.  Radiology measured 5.9 x 5.7 cm.  His previous scan in December 2023 was remeasured and was 5.7 x 5.7 cm.  I do not think there has been any significant change in his aneurysm.  It is above the usual surgical threshold of 5.5 cm but given his multiple comorbid risk factors including  previous aortic valve replacement I think it would be best to continue following this as long as it is stable.  His operative risk for replacement of the ascending aorta and aortic root with removal of his mechanical valve and Bentall procedure with hypothermic circulatory arrest would be high.  I reviewed the CT images with the patient and his wife.  I stressed the importance of continued good blood pressure control in preventing further enlargement and acute aortic dissection.  I advised him against doing any heavy lifting or strenuous physical activity that may require a Valsalva maneuver and could suddenly raise his blood pressure to high levels.  Plan:  I will see him back in 6 months with a CT scan of the chest without contrast due to his stage III chronic kidney disease.   Alleen Borne, MD [facility] [phone]

## 2023-02-15 ENCOUNTER — Telehealth: Payer: 59 | Admitting: Family Medicine

## 2023-02-19 ENCOUNTER — Encounter: Payer: Self-pay | Admitting: Family Medicine

## 2023-02-19 ENCOUNTER — Telehealth: Payer: Self-pay | Admitting: Family Medicine

## 2023-02-19 NOTE — Telephone Encounter (Signed)
I would strongly advise patient to come in for recheck on his INR.

## 2023-02-19 NOTE — Telephone Encounter (Signed)
Called to get patient an earlier appointment per provider. [name] patients friend states that his INR is in the normal range and does not need to come into the office any sooner than next appt.patient is ok of with keeping upcoming appointment for 10/1 at 10:20.

## 2023-02-20 LAB — POCT INR: POC INR: 3.4

## 2023-02-21 ENCOUNTER — Encounter: Payer: Self-pay | Admitting: Family Medicine

## 2023-02-21 LAB — POCT INR: POC INR: 3.5

## 2023-02-22 NOTE — Telephone Encounter (Signed)
A mychart appointment has been made

## 2023-02-26 ENCOUNTER — Telehealth (INDEPENDENT_AMBULATORY_CARE_PROVIDER_SITE_OTHER): Payer: 59 | Admitting: Family Medicine

## 2023-02-26 DIAGNOSIS — Z7901 Long term (current) use of anticoagulants: Secondary | ICD-10-CM

## 2023-02-26 LAB — POCT INR: INR: 3.5 — AB (ref 0.80–1.20)

## 2023-02-26 NOTE — Progress Notes (Addendum)
This visit was completed via video due to the restrictions of the COVID-19 pandemic. All issues as above were discussed and addressed but no physical exam was performed. If it was felt that the patient should be evaluated in the office, they were directed there. The patient verbally consented to this visit. Due to the catastrophic nature of the COVID-19 pandemic, this visit was done through video contact only.","This visit was completed via video visit through MyChart due to the restrictions of the COVID-19 pandemic. All issues as above were discussed and addressed. Physical exam was done as above through visual confirmation on video through MyChart. If it was felt that the patient should be evaluated in the office, they were directed there. The patient verbally consented to this visit. Location of the patient: home Location of the provider: work Those involved with this call:  Provider:  Prescott Gum, NP Front Desk/Registration:  Tarry Kos   Time spent on call:  10 minutes on the phone discussing health concerns. 10 minutes total spent in review of patient's record and preparation of their chart.  Subjective:    Patient ID: Roberto [name], male    DOB: 07/16/1955, 67 y.o.   MRN: [id number]  Chief Complaint  Patient presents with   INR follow up    Coumadin Management.  INR today is 3.5, yesterday was 3.7, and Saturday 3.5. He has been taking Coumadin 4mg  dosage over the past two weeks due to missed appointments. He has completed the course of Bactrim.  The expected duration of coumadin treatment is lifelong. The reason for anticoagulation is  mechanical heart valve.  Present Coumadin dose: 4mg  for 2 days and then 7.5mg  on third day.  Goal: 2.5-3.5 2.5-3.5  Excessive bruising: no Nose bleeding: no Rectal bleeding: no Eating diet with consistent amounts of foods containing Vitamin K: no Any recent antibiotic use: No   Relevant past medical, surgical, family and social history  reviewed and updated as indicated. Interim medical history since our last visit reviewed. Allergies and medications reviewed and updated.  Review of Systems  Constitutional:  Negative for fever.  HENT:  Negative for nosebleeds.   Respiratory: Negative.    Cardiovascular: Negative.   Gastrointestinal:  Negative for blood in stool.  Genitourinary:  Negative for hematuria.  Skin: Negative.   Hematological:  Bruises/bleeds easily.   Per HPI unless specifically indicated above     Objective:    There were no vitals taken for this visit.  Wt Readings from Last 3 Encounters:  02/14/23 266 lb (120.7 kg)  02/02/23 268 lb 3.2 oz (121.7 kg)  01/24/23 272 lb (123.4 kg)     Results for orders placed or performed in visit on 02/26/23  POCT INR  Result Value Ref Range   INR 3.50 (A) 0.80 - 1.20   *Note: Due to a large number of results and/or encounters for the requested time period, some results have not been displayed. A complete set of results can be found in Results Review.      Assessment & Plan:   Problem List Items Addressed This Visit     Anticoagulated on Coumadin - Primary    Chronic, stable. INR 3.5 today, recommend continue 4 MG each night for the next 7 days and return for recheck visit in 1 week. INR goal 2.5-3.5.         Follow up plan: Return in about 1 week (around 03/05/2023) for Follow up INR.   Physical Exam Constitutional:  Appearance: Normal appearance.  Neurological:     Mental Status: He is alert.

## 2023-02-26 NOTE — Assessment & Plan Note (Signed)
Chronic, stable. INR 3.5 today, recommend continue 4 MG each night for the next 7 days and return for recheck visit in 1 week. INR goal 2.5-3.5.

## 2023-03-03 ENCOUNTER — Other Ambulatory Visit: Payer: Self-pay | Admitting: Internal Medicine

## 2023-03-03 ENCOUNTER — Other Ambulatory Visit: Payer: Self-pay | Admitting: Family Medicine

## 2023-03-06 ENCOUNTER — Encounter: Payer: Self-pay | Admitting: Family Medicine

## 2023-03-06 ENCOUNTER — Ambulatory Visit: Payer: 59 | Admitting: Family Medicine

## 2023-03-06 NOTE — Telephone Encounter (Signed)
Nortriptyline Rx -11/13/22 #90 1RF-too soon Requested Prescriptions  Pending Prescriptions Disp Refills   nortriptyline (PAMELOR) 10 MG capsule [Pharmacy Med Name: Nortriptyline HCl 10 MG Oral Capsule] 90 capsule 0    Sig: Take 1 capsule by mouth at bedtime     Psychiatry:  Antidepressants - Heterocyclics (TCAs) Passed - 03/03/2023  8:25 AM      Passed - Valid encounter within last 6 months    Recent Outpatient Visits           1 week ago Anticoagulated on Coumadin   [facility] Pearley, Sherran Needs, NP   3 weeks ago Anticoagulated on Coumadin   [facility], Sherran Needs, NP   4 weeks ago Anticoagulated on Coumadin   [place] [facility], Sherran Needs, NP   1 month ago Anticoagulated on Coumadin   [place] [facility] [place], Sherran Needs, NP   3 months ago Hypotension due to drugs   [place] [facility] [name], DO       Future Appointments             Today Pearley, Sherran Needs, NP [place] [facility], [facility]   In 2 weeks [place], Cristal Deer, MD [facility] [place]   In 4 weeks [name], DO [place] [facility], [facility]   In 2 months Laural Benes, [name], DO [place] [facility], [facility]             VENTOLIN HFA 108 (90 Base) MCG/ACT inhaler [Pharmacy Med Name: Ventolin HFA 108 (90 Base) MCG/ACT Inhalation Aerosol Solution] 54 g 0    Sig: INHALE 2 PUFFS BY MOUTH EVERY 6 HOURS AS NEEDED FOR WHEEZING OR SHORTNESS OF BREATH     Pulmonology:  Beta Agonists 2 Passed - 03/03/2023  8:25 AM      Passed - Last BP in normal range    BP Readings from Last 1 Encounters:  02/14/23 105/61         Passed - Last Heart Rate in normal range    Pulse Readings from Last 1 Encounters:  02/14/23 70         Passed - Valid encounter within last 12 months    Recent Outpatient Visits            1 week ago Anticoagulated on Coumadin   [place] [facility], Sherran Needs, NP   3 weeks ago Anticoagulated on Coumadin   [place] [facility] [place], Sherran Needs, NP   4 weeks ago Anticoagulated on Coumadin   [place] [facility] [place], Sherran Needs, NP   1 month ago Anticoagulated on Coumadin   [place] [facility] [place], Sherran Needs, NP   3 months ago Hypotension due to drugs   [place] [facility] [place], Oralia Rud, DO       Future Appointments             Today Pearley, Sherran Needs, NP [place] [facility], [facility]   In 2 weeks End, Cristal Deer, MD [facility] [place]   In 4 weeks [name], DO [place] [facility], [facility]   In 2 months Laural Benes, Oralia Rud, DO [place] [facility], [facility]

## 2023-03-06 NOTE — Telephone Encounter (Signed)
Requested medication (s) are due for refill today yes  Requested medication (s) are on the active medication list -yes  Future visit scheduled today  Last refill: 12/13/22  Notes to clinic: unclear what Rx needed- request for solution- pharmacy filed as inhaler- patient has appointment today- please verify  Requested Prescriptions  Pending Prescriptions Disp Refills   VENTOLIN HFA 108 (90 Base) MCG/ACT inhaler [Pharmacy Med Name: Ventolin HFA 108 (90 Base) MCG/ACT Inhalation Aerosol Solution] 54 g 0    Sig: INHALE 2 PUFFS BY MOUTH EVERY 6 HOURS AS NEEDED FOR WHEEZING OR SHORTNESS OF BREATH     Pulmonology:  Beta Agonists 2 Passed - 03/03/2023  8:25 AM      Passed - Last BP in normal range    BP Readings from Last 1 Encounters:  02/14/23 105/61         Passed - Last Heart Rate in normal range    Pulse Readings from Last 1 Encounters:  02/14/23 70         Passed - Valid encounter within last 12 months    Recent Outpatient Visits           1 week ago Anticoagulated on Coumadin   [place] [facility], Sherran Needs, NP   3 weeks ago Anticoagulated on Coumadin   [place] [facility] [place], Sherran Needs, NP   4 weeks ago Anticoagulated on Coumadin   [place] [facility] [place], Sherran Needs, NP   1 month ago Anticoagulated on Coumadin   [place] [facility] [place], Sherran Needs, NP   3 months ago Hypotension due to drugs   [place] [facility] [place], Oralia Rud, DO       Future Appointments             Today Pearley, Sherran Needs, NP [place] [facility]   In 2 weeks End, Cristal Deer, MD [facility] [place]   In 4 weeks [name], DO [place] [facility]   In 2 months [name], [name], DO [place] [facility], [facility]            Refused Prescriptions Disp Refills   nortriptyline (PAMELOR)  10 MG capsule [Pharmacy Med Name: Nortriptyline HCl 10 MG Oral Capsule] 90 capsule 0    Sig: Take 1 capsule by mouth at bedtime     Psychiatry:  Antidepressants - Heterocyclics (TCAs) Passed - 03/03/2023  8:25 AM      Passed - Valid encounter within last 6 months    Recent Outpatient Visits           1 week ago Anticoagulated on Coumadin   [place] [facility] Pearley, Sherran Needs, NP   3 weeks ago Anticoagulated on Coumadin   [place] [facility] [place], Sherran Needs, NP   4 weeks ago Anticoagulated on Coumadin   [place] [facility] [place], Sherran Needs, NP   1 month ago Anticoagulated on Coumadin   [place] [facility] [place], Sherran Needs, NP   3 months ago Hypotension due to drugs   [place] [facility] [place], Oralia Rud, DO       Future Appointments             Today Pearley, Sherran Needs, NP  [facility]   In 2 weeks End, Cristal Deer, MD [facility]  In 4 weeks Laural Benes, Oralia Rud, DO [place] [facility], [facility]   In 2 months Laural Benes, Oralia Rud, DO [place] [facility], [facility]               Requested Prescriptions  Pending Prescriptions Disp Refills   VENTOLIN HFA 108 (90 Base) MCG/ACT inhaler [Pharmacy Med Name: Ventolin HFA 108 (90 Base) MCG/ACT Inhalation Aerosol Solution] 54 g 0    Sig: INHALE 2 PUFFS BY MOUTH EVERY 6 HOURS AS NEEDED FOR WHEEZING OR SHORTNESS OF BREATH     Pulmonology:  Beta Agonists 2 Passed - 03/03/2023  8:25 AM      Passed - Last BP in normal range    BP Readings from Last 1 Encounters:  02/14/23 105/61         Passed - Last Heart Rate in normal range    Pulse Readings from Last 1 Encounters:  02/14/23 70         Passed - Valid encounter within last 12 months    Recent Outpatient Visits           1 week ago Anticoagulated on Coumadin   [place] [facility], Sherran Needs, NP   3 weeks ago Anticoagulated on Coumadin   [place] [facility], Sherran Needs, NP   4 weeks ago Anticoagulated on Coumadin   [place] [facility] [place], Sherran Needs, NP   1 month ago Anticoagulated on Coumadin   [place] [facility] [place], Sherran Needs, NP   3 months ago Hypotension due to drugs   [place] [facility] [place], Oralia Rud, DO       Future Appointments             Today Pearley, Sherran Needs, NP [place] [facility], [facility]   In 2 weeks End, Cristal Deer, MD [facility] [place]   In 4 weeks [name], DO [place] [facility], [facility]   In 2 months [name], [name], DO [place] [facility], [facility]            Refused Prescriptions Disp Refills   nortriptyline (PAMELOR) 10 MG capsule [Pharmacy Med Name: Nortriptyline HCl 10 MG Oral Capsule] 90 capsule 0    Sig: Take 1 capsule by mouth at bedtime     Psychiatry:  Antidepressants - Heterocyclics (TCAs) Passed - 03/03/2023  8:25 AM      Passed - Valid encounter within last 6 months    Recent Outpatient Visits           1 week ago Anticoagulated on Coumadin   [facility], Sherran Needs, NP   3 weeks ago Anticoagulated on Coumadin   [place] [facility] [place], Sherran Needs, NP   4 weeks ago Anticoagulated on Coumadin   [place] [facility] [place], Sherran Needs, NP   1 month ago Anticoagulated on Coumadin   [place] [facility] [place], Sherran Needs, NP   3 months ago Hypotension due to drugs   [place] [facility] [place], Oralia Rud, DO       Future Appointments             Today Pearley, Sherran Needs, NP  [facility]   In 2 weeks End, Cristal Deer, MD [facility] at  [place]   In 4 weeks Olevia Perches P, DO [facility], [facility]   In 2 months Laural Benes, Oralia Rud, DO [place] [facility], [facility]

## 2023-03-12 LAB — POCT INR
INR: 2.8 (ref 2.0–3.0)
INR: 2.9 (ref 2.0–3.0)

## 2023-03-15 ENCOUNTER — Ambulatory Visit (INDEPENDENT_AMBULATORY_CARE_PROVIDER_SITE_OTHER): Payer: 59 | Admitting: Family Medicine

## 2023-03-15 ENCOUNTER — Telehealth: Payer: Self-pay | Admitting: Internal Medicine

## 2023-03-15 DIAGNOSIS — Z952 Presence of prosthetic heart valve: Secondary | ICD-10-CM | POA: Diagnosis not present

## 2023-03-15 NOTE — Telephone Encounter (Signed)
Will route to MD.  Molli Knock for letter? Thanks!

## 2023-03-15 NOTE — Telephone Encounter (Signed)
Calling to get a work restriction letter for work. Patient plan to go back to work on next week. Please advise

## 2023-03-16 ENCOUNTER — Encounter: Payer: Self-pay | Admitting: Internal Medicine

## 2023-03-16 NOTE — Telephone Encounter (Signed)
Letter completed by MD and picked up by pt's wife on 03/16/23.

## 2023-03-22 ENCOUNTER — Ambulatory Visit: Payer: 59 | Admitting: Internal Medicine

## 2023-03-27 ENCOUNTER — Encounter: Payer: Self-pay | Admitting: Family Medicine

## 2023-03-28 LAB — PROTIME-INR: INR: 3.1 — AB (ref 0.80–1.20)

## 2023-03-30 ENCOUNTER — Ambulatory Visit (INDEPENDENT_AMBULATORY_CARE_PROVIDER_SITE_OTHER): Payer: 59 | Admitting: Family Medicine

## 2023-03-30 DIAGNOSIS — Z952 Presence of prosthetic heart valve: Secondary | ICD-10-CM

## 2023-04-03 ENCOUNTER — Ambulatory Visit: Payer: 59 | Admitting: Family Medicine

## 2023-04-06 ENCOUNTER — Ambulatory Visit: Payer: Self-pay

## 2023-04-06 ENCOUNTER — Encounter: Payer: Self-pay | Admitting: Internal Medicine

## 2023-04-06 ENCOUNTER — Ambulatory Visit: Payer: 59 | Attending: Internal Medicine | Admitting: Internal Medicine

## 2023-04-06 VITALS — BP 108/78 | HR 72 | Ht 66.0 in | Wt 257.1 lb

## 2023-04-06 DIAGNOSIS — I2510 Atherosclerotic heart disease of native coronary artery without angina pectoris: Secondary | ICD-10-CM

## 2023-04-06 DIAGNOSIS — Z8673 Personal history of transient ischemic attack (TIA), and cerebral infarction without residual deficits: Secondary | ICD-10-CM

## 2023-04-06 DIAGNOSIS — R251 Tremor, unspecified: Secondary | ICD-10-CM

## 2023-04-06 DIAGNOSIS — I7121 Aneurysm of the ascending aorta, without rupture: Secondary | ICD-10-CM

## 2023-04-06 DIAGNOSIS — I700 Atherosclerosis of aorta: Secondary | ICD-10-CM

## 2023-04-06 DIAGNOSIS — E1169 Type 2 diabetes mellitus with other specified complication: Secondary | ICD-10-CM

## 2023-04-06 DIAGNOSIS — I5022 Chronic systolic (congestive) heart failure: Secondary | ICD-10-CM | POA: Diagnosis not present

## 2023-04-06 DIAGNOSIS — E1159 Type 2 diabetes mellitus with other circulatory complications: Secondary | ICD-10-CM

## 2023-04-06 DIAGNOSIS — I428 Other cardiomyopathies: Secondary | ICD-10-CM

## 2023-04-06 DIAGNOSIS — R918 Other nonspecific abnormal finding of lung field: Secondary | ICD-10-CM

## 2023-04-06 DIAGNOSIS — E785 Hyperlipidemia, unspecified: Secondary | ICD-10-CM

## 2023-04-06 MED ORDER — FUROSEMIDE 80 MG PO TABS: 80.0000 mg | ORAL_TABLET | Freq: Every day | ORAL | 3 refills | Status: DC

## 2023-04-06 MED ORDER — METOPROLOL SUCCINATE ER 100 MG PO TB24: 100.0000 mg | ORAL_TABLET | Freq: Every day | ORAL | 3 refills | Status: DC

## 2023-04-06 MED ORDER — LISINOPRIL 2.5 MG PO TABS: 2.5000 mg | ORAL_TABLET | Freq: Every day | ORAL | 3 refills | Status: DC

## 2023-04-06 MED ORDER — POTASSIUM CHLORIDE CRYS ER 20 MEQ PO TBCR: 20.0000 meq | EXTENDED_RELEASE_TABLET | Freq: Every day | ORAL | 3 refills | Status: DC

## 2023-04-06 MED ORDER — ROSUVASTATIN CALCIUM 40 MG PO TABS: 40.0000 mg | ORAL_TABLET | Freq: Every day | ORAL | 3 refills | Status: DC

## 2023-04-06 MED ORDER — DAPAGLIFLOZIN PROPANEDIOL 10 MG PO TABS: 10.0000 mg | ORAL_TABLET | Freq: Every day | ORAL | 3 refills | Status: DC

## 2023-04-06 NOTE — Progress Notes (Unsigned)
Cardiology Office Note:  .   Date:  04/08/2023  ID:  Roberto [name], DOB [birth year], MRN [id number] PCP: [name], Roberto [name]  [facility] Providers Cardiologist:  Yvonne Kendall, MD Sleep Medicine:  Armanda Magic, MD     History of Present Illness: .   Discussed the use of AI scribe software for clinical note transcription with the patient, who gave verbal consent to proceed.  Roberto [name] is a 67 y.o. male with history of mechanical aortic valve replacement, non-obstructive coronary artery disease, thoracic aortic aneurysm (followed by [facility]), stroke, right renal infarct, chronic systolic heart failure (LVEF 40-45%), hyperlipidemia, obstructive sleep apnea not using CPAP, and morbid obesity, who presents for follow-up of valvular heart disease, HFrEF, and thoracic aortic aneurysm.  I last saw him in June, which time he was feeling fairly well.  He still reported occasional orthostatic lightheadedness but has not passed out or fallen.  He was concerned that a chronic tremor was getting a little bit worse.  We did not make any medication changes or pursue further testing.  He saw Roberto [name] in August for surveillance of his thoracic aortic aneurysm.  It was felt that close follow-up and medical therapy should be continued given his multiple comorbidities that would make repair risky.  Today, Roberto [name] reports a self-inflicted nose injury from using clippers, but otherwise, he has been doing well. He has made significant progress in weight loss, reducing from over 300 pounds to 257 pounds. He denies any chest pain, trouble breathing, swelling in the legs, or palpitations. He reports occasional lightheadedness but no episodes of syncope. He has been maintaining an active lifestyle, including mowing the lawn and using an elliptical trainer. He does not lift weights and tries to avoid straining. Roberto [name]'s tremors have reportedly worsened, affecting both hands, with the right hand showing a  lighter tremor. He had a previous consultation with Roberto [name] neurology) but was dissatisfied with the experience and is considering seeking a second opinion.  He is hoping to start working for [employer] but is still waiting to hear back from them.    ROS: See HPI  Studies Reviewed: Marland Kitchen   EKG Interpretation Date/Time:  Friday April 06 2023 10:35:58 EDT Ventricular Rate:  72 PR Interval:  230 QRS Duration:  130 QT Interval:  424 QTC Calculation: 464 R Axis:   -22  Text Interpretation: Sinus rhythm with 1st degree A-V block with occasional Premature ventricular complexes Non-specific intra-ventricular conduction block When compared with ECG of 25-Dec-2022 No significant change was found Confirmed by Ajdin Macke, Cristal Deer [id number]) on 04/08/2023 9:39:08 AM    CT chest (02/14/2023): Similar appearance of thoracic aortic aneurysm, measuring 5.9 cm (previously 5.3 cm).  New set up centimeter right and left upper lobe nodules.  3 to 84-month follow-up CT recommended.  TTE (04/28/2022): Mildly dilated left ventricle with normal wall thickness.  LVEF 35-40% with grade 2 diastolic dysfunction.  Normal RV size with mild hypokinesis.  Mild left atrial enlargement.  Normal mitral and tricuspid valves.  Mechanical aortic valve present with normal function and mean gradient of 13 mmHg.  Normal CVP.  Risk Assessment/Calculations:         Physical Exam:   VS:  BP 108/78 (BP Location: Left Arm, Patient Position: Sitting, Cuff Size: Large)   Pulse 72   Ht 5\' 6"  (1.676 m)   Wt 257 lb 2 oz (116.6 kg)   SpO2 98%   BMI 41.50 kg/m    Wt Readings from  Last 3 Encounters:  04/06/23 257 lb 2 oz (116.6 kg)  02/14/23 266 lb (120.7 kg)  02/02/23 268 lb 3.2 oz (121.7 kg)    General:  NAD. Neck: No JVD or HJR. Lungs: Clear to auscultation bilaterally without wheezes or crackles. Heart: Regular rate and rhythm with 1/6 systolic murmur. Abdomen: Soft, nontender, nondistended. Extremities: No lower extremity  edema.  ASSESSMENT AND PLAN: .    Chronic HFrEF due to NICM: Roberto [name] appears euvolemic with NYHA class I symptoms.  His blood pressure is a little higher today than at prior visits, though I am reluctant to escalate his GDMT given history of soft blood pressure and intermittent lightheadedness.  If blood pressure remains in this range and lightheadedness is not an ongoing issue, we could consider increasing lisinopril at our  next visit.  Thoracic aortic aneurysm and valvular heart disease: Severely enlarged thoracic aorta was stable to minimally larger in size at last visit with Roberto [name] in August.  We will continue with blood pressure control and anticoagulation with warfarin and aspirin in the setting of his mechanical aortic valve and prior embolic events.  He is due for 59-month follow-up, including CT scan, with Roberto [name] around February.  Coronary artery disease and hyperlipidemia associated with type 2 diabetes mellitus: No angina reported.  Continue current medications for secondary prevention.  Morbid obesity: BMI remains greater than 35 with multiple comorbidities, though Roberto [name] continues to lose weight.  I congratulated him on this and encouraged him to keep working on weight loss to help improve his overall health and reduce his perioperative risk should he need to undergo aortic repair in the future.  Lung nodules: No dyspnea, cough, or fevers reported.  Subcentimeter lung nodules incidentally noted on CT chest for follow-up of TAA in August.  3 to 67-month CT follow-up recommended with plans for repeat CT at the time of cardiac surgery follow-up around February.  Tremor and history of stroke: Roberto [name] feels like his tremors are slowly getting worse.  He was previously seen by [facility] neurology but would like a second opinion.  We will refer him to [facility] neurology in [place].    Dispo: Return to clinic in 3 months.  Signed, Yvonne Kendall, MD

## 2023-04-06 NOTE — Telephone Encounter (Signed)
Summary: rx req / smoking concern   The patient's friend would like for the patient to be prescribed something to help them with smoking cessation  The patient will have surgery in early 2025 and their friend is worried that smoking will cause complications  Please contact further when possible      Called pt - left message on machine to return our call.

## 2023-04-06 NOTE — Telephone Encounter (Signed)
Summary: rx req / smoking concern   The patient's friend would like for the patient to be prescribed something to help them with smoking cessation  The patient will have surgery in early 2025 and their friend is worried that smoking will cause complications  Please contact further when possible     Called pt - left message on machine to return call. After 3 attempts, unable to contact pt. Will forward encounter  to OV for follow up.

## 2023-04-06 NOTE — Patient Instructions (Signed)
Medication Instructions:  Your physician recommends that you continue on your current medications as directed. Please refer to the Current Medication list given to you today.   *If you need a refill on your cardiac medications before your next appointment, please call your pharmacy*   Lab Work: No labs ordered today    Testing/Procedures: No test ordered today    Follow-Up: At [facility], you and your health needs are our priority.  As part of our continuing mission to provide you with exceptional heart care, we have created designated Provider Care Teams.  These Care Teams include your primary Cardiologist (physician) and Advanced Practice Providers (APPs -  Physician Assistants and Nurse Practitioners) who all work together to provide you with the care you need, when you need it.  We recommend signing up for the patient portal called "MyChart".  Sign up information is provided on this After Visit Summary.  MyChart is used to connect with patients for Virtual Visits (Telemedicine).  Patients are able to view lab/test results, encounter notes, upcoming appointments, etc.  Non-urgent messages can be sent to your provider as well.   To learn more about what you can do with MyChart, go to [email or web address].    Your next appointment:   3 month(s)  Provider:   You may see Yvonne Kendall, MD or one of the following Advanced Practice Providers on your designated Care Team:   Nicolasa Ducking, NP Eula Listen, [name] Cadence Fransico Michael, [name] Charlsie Quest, NP

## 2023-04-06 NOTE — Telephone Encounter (Signed)
Patient called, left VM to return the call to the office to speak to the NT.    Summary: rx req / smoking concern   The patient's friend would like for the patient to be prescribed something to help them with smoking cessation  The patient will have surgery in early 2025 and their friend is worried that smoking will cause complications  Please contact further when possible

## 2023-04-08 ENCOUNTER — Encounter: Payer: Self-pay | Admitting: Internal Medicine

## 2023-04-09 NOTE — Telephone Encounter (Signed)
Appointment has been made.

## 2023-04-09 NOTE — Telephone Encounter (Signed)
Called patient to schedule an appt. Left message on machine for patient to call the office.

## 2023-04-09 NOTE — Telephone Encounter (Signed)
appt

## 2023-04-10 ENCOUNTER — Encounter: Payer: Self-pay | Admitting: Family Medicine

## 2023-04-10 ENCOUNTER — Ambulatory Visit (INDEPENDENT_AMBULATORY_CARE_PROVIDER_SITE_OTHER): Payer: 59 | Admitting: Family Medicine

## 2023-04-10 VITALS — BP 101/72 | HR 71 | Temp 98.1°F | Wt 264.2 lb

## 2023-04-10 DIAGNOSIS — Z23 Encounter for immunization: Secondary | ICD-10-CM

## 2023-04-10 DIAGNOSIS — Z720 Tobacco use: Secondary | ICD-10-CM | POA: Diagnosis not present

## 2023-04-10 MED ORDER — NICOTINE 7 MG/24HR TD PT24: 7.0000 mg | MEDICATED_PATCH | Freq: Every day | TRANSDERMAL | 3 refills | Status: DC

## 2023-04-10 MED ORDER — BUPROPION HCL ER (SR) 150 MG PO TB12: ORAL_TABLET | ORAL | 3 refills | Status: DC

## 2023-04-10 NOTE — Progress Notes (Signed)
BP 101/72   Pulse 71   Temp 98.1 F (36.7 C) (Oral)   Wt 264 lb 3.2 oz (119.8 kg)   BMI 42.64 kg/m    Subjective:    Patient ID: Roberto [name], male    DOB: [birth year], 67 y.o.   MRN: [id number]  HPI: Roberto [name] is a 67 y.o. male  Chief Complaint  Patient presents with   Nicotine Dependence    Patient states he is here to discuss smoking. States his surgeon told him he has to stop smoking before his surgery in the beginning of 2025. States he is not smoking consistently but does smoke some.     SMOKING CESSATION Smoking Status: smoking every day Smoking Amount: 1/2ppd Smoking Onset: couple of weeks Smoking Quit Date: not set  Smoking triggers: stress, worry Type of tobacco use: cigarettes Children in the house: no Other household members who smoke: no Treatments attempted: chantix (bad reaction), patches Pneumovax: up to date  Relevant past medical, surgical, family and social history reviewed and updated as indicated. Interim medical history since our last visit reviewed. Allergies and medications reviewed and updated.  Review of Systems  Constitutional: Negative.   Respiratory: Negative.    Cardiovascular: Negative.   Musculoskeletal: Negative.   Neurological: Negative.   Psychiatric/Behavioral: Negative.      Per HPI unless specifically indicated above     Objective:    BP 101/72   Pulse 71   Temp 98.1 F (36.7 C) (Oral)   Wt 264 lb 3.2 oz (119.8 kg)   BMI 42.64 kg/m   Wt Readings from Last 3 Encounters:  04/10/23 264 lb 3.2 oz (119.8 kg)  04/06/23 257 lb 2 oz (116.6 kg)  02/14/23 266 lb (120.7 kg)    Physical Exam Vitals and nursing note reviewed.  Constitutional:      General: He is not in acute distress.    Appearance: Normal appearance. He is obese. He is not ill-appearing, toxic-appearing or diaphoretic.  HENT:     Head: Normocephalic and atraumatic.     Right Ear: External ear normal.     Left Ear: External ear normal.     Nose:  Nose normal.     Mouth/Throat:     Mouth: Mucous membranes are moist.     Pharynx: Oropharynx is clear.  Eyes:     General: No scleral icterus.       Right eye: No discharge.        Left eye: No discharge.     Extraocular Movements: Extraocular movements intact.     Conjunctiva/sclera: Conjunctivae normal.     Pupils: Pupils are equal, round, and reactive to light.  Cardiovascular:     Rate and Rhythm: Normal rate and regular rhythm.     Pulses: Normal pulses.     Heart sounds: Murmur heard.     No friction rub. No gallop.     Comments: + click Pulmonary:     Effort: Pulmonary effort is normal. No respiratory distress.     Breath sounds: Normal breath sounds. No stridor. No wheezing, rhonchi or rales.  Chest:     Chest wall: No tenderness.  Musculoskeletal:        General: Normal range of motion.     Cervical back: Normal range of motion and neck supple.  Skin:    General: Skin is warm and dry.     Capillary Refill: Capillary refill takes less than 2 seconds.     Coloration: Skin is not  jaundiced or pale.     Findings: No bruising, erythema, lesion or rash.  Neurological:     General: No focal deficit present.     Mental Status: He is alert and oriented to person, place, and time. Mental status is at baseline.  Psychiatric:        Mood and Affect: Mood normal.        Behavior: Behavior normal.        Thought Content: Thought content normal.        Judgment: Judgment normal.     Results for orders placed or performed in visit on 03/30/23  Protime-INR  Result Value Ref Range   INR 3.10 (A) 0.80 - 1.20   *Note: Due to a large number of results and/or encounters for the requested time period, some results have not been displayed. A complete set of results can be found in Results Review.      Assessment & Plan:   Problem List Items Addressed This Visit   None Visit Diagnoses     Tobacco abuse    -  Primary   Has restarted smoking- will start him on wellbutrin and  nicotene patches. Call with any concerns. Recheck in about a month.   Needs flu shot       Flu shot given today.   Relevant Orders   Flu Vaccine Trivalent High Dose (Fluad)        Follow up plan: Return As scheduled.

## 2023-04-10 NOTE — Patient Instructions (Signed)
Dr. Terrace Arabia (neurology) [address], [place], [place] [address] Phone: [phone]

## 2023-05-11 ENCOUNTER — Telehealth: Payer: Self-pay

## 2023-05-11 ENCOUNTER — Telehealth: Payer: Self-pay | Admitting: Family Medicine

## 2023-05-11 NOTE — Telephone Encounter (Signed)
I spoke with [facility], who noted that Dr. Laural Benes does monitor the patient's INR, but they have not received a home monitor report in over a month. Per chart review, the reports received were scanned by the [facility] medical records department and forwarded to Dr. Okey Dupre.  The nurse will fax the reports to Dr. Henriette Combs office for review.

## 2023-05-11 NOTE — Telephone Encounter (Signed)
I have not gotten anything on his coumadin on over a month. He used to do home draws and we managed it, but I haven't gotten anything recently and he has not called the office or been seen

## 2023-05-11 NOTE — Telephone Encounter (Signed)
Spoke with Elease Hashimoto at Dr Serita Kyle office with Cardiology, stating they had received the patient's results for INR from his home draw on 04/24/23. Per Elease Hashimoto, says she will fax the results over to our office for the provider. She also, said she would send over any results they received on the patient. Just FYI

## 2023-05-11 NOTE — Telephone Encounter (Signed)
Thanks for the update.  I will defer ongoing warfarin management to Dr. Laural Benes.  Thayer Ohm

## 2023-05-11 NOTE — Telephone Encounter (Signed)
Received PT-INR lab results via fax. Per chart review, Dr. Henriette Combs office maintain pt's coumadin levels. Nurse contacted pcp office to ensure they are continuing to monitor. Nurse was informed a message would be sent to pcp and would receive a call back.

## 2023-05-11 NOTE — Telephone Encounter (Unsigned)
Copied from CRM [id number]. Topic: General - Other >> May 11, 2023  8:44 AM Marlow Baars wrote: Reason for CRM: Elease Hashimoto with Dr End the Cardiologist wants to know if Dr Laural Benes maintains the patients INR Coumadin levels. She said Dr End just wants to know who is managing his Coumadin levels. Please assist further

## 2023-05-14 ENCOUNTER — Ambulatory Visit (INDEPENDENT_AMBULATORY_CARE_PROVIDER_SITE_OTHER): Payer: 59 | Admitting: Family Medicine

## 2023-05-14 DIAGNOSIS — Z952 Presence of prosthetic heart valve: Secondary | ICD-10-CM | POA: Diagnosis not present

## 2023-05-14 LAB — PROTIME-INR: INR: 3.3 — AB (ref 0.80–1.20)

## 2023-05-16 ENCOUNTER — Encounter: Payer: Self-pay | Admitting: Family Medicine

## 2023-05-16 ENCOUNTER — Ambulatory Visit (INDEPENDENT_AMBULATORY_CARE_PROVIDER_SITE_OTHER): Payer: 59 | Admitting: Family Medicine

## 2023-05-16 VITALS — BP 99/73 | HR 91 | Temp 98.5°F | Ht 66.0 in | Wt 264.8 lb

## 2023-05-16 DIAGNOSIS — I2510 Atherosclerotic heart disease of native coronary artery without angina pectoris: Secondary | ICD-10-CM

## 2023-05-16 DIAGNOSIS — Z0000 Encounter for general adult medical examination without abnormal findings: Secondary | ICD-10-CM | POA: Diagnosis not present

## 2023-05-16 DIAGNOSIS — E1169 Type 2 diabetes mellitus with other specified complication: Secondary | ICD-10-CM

## 2023-05-16 DIAGNOSIS — M1A9XX0 Chronic gout, unspecified, without tophus (tophi): Secondary | ICD-10-CM

## 2023-05-16 DIAGNOSIS — I152 Hypertension secondary to endocrine disorders: Secondary | ICD-10-CM

## 2023-05-16 DIAGNOSIS — E1159 Type 2 diabetes mellitus with other circulatory complications: Secondary | ICD-10-CM

## 2023-05-16 DIAGNOSIS — J432 Centrilobular emphysema: Secondary | ICD-10-CM

## 2023-05-16 DIAGNOSIS — Z7985 Long-term (current) use of injectable non-insulin antidiabetic drugs: Secondary | ICD-10-CM

## 2023-05-16 DIAGNOSIS — Z1211 Encounter for screening for malignant neoplasm of colon: Secondary | ICD-10-CM

## 2023-05-16 DIAGNOSIS — E785 Hyperlipidemia, unspecified: Secondary | ICD-10-CM

## 2023-05-16 DIAGNOSIS — H538 Other visual disturbances: Secondary | ICD-10-CM

## 2023-05-16 DIAGNOSIS — R3911 Hesitancy of micturition: Secondary | ICD-10-CM

## 2023-05-16 LAB — URINALYSIS, ROUTINE W REFLEX MICROSCOPIC
Bilirubin, UA: NEGATIVE
Ketones, UA: NEGATIVE
Leukocytes,UA: NEGATIVE
Nitrite, UA: NEGATIVE
Protein,UA: NEGATIVE
Specific Gravity, UA: 1.01 (ref 1.005–1.030)
Urobilinogen, Ur: 0.2 mg/dL (ref 0.2–1.0)
pH, UA: 5.5 (ref 5.0–7.5)

## 2023-05-16 LAB — MICROALBUMIN, URINE WAIVED
Creatinine, Urine Waived: 50 mg/dL (ref 10–300)
Microalb, Ur Waived: 10 mg/L (ref 0–19)
Microalb/Creat Ratio: <30 mg/g (ref <30)

## 2023-05-16 LAB — MICROSCOPIC EXAMINATION
Bacteria, UA: NONE SEEN
Epithelial Cells (non renal): NONE SEEN /[HPF] (ref 0–10)
WBC, UA: NONE SEEN /[HPF] (ref 0–5)

## 2023-05-16 LAB — BAYER DCA HB A1C WAIVED: HB A1C (BAYER DCA - WAIVED): 6.1 % — ABNORMAL HIGH (ref 4.8–5.6)

## 2023-05-16 MED ORDER — WARFARIN SODIUM 7.5 MG PO TABS: ORAL_TABLET | ORAL | 1 refills | Status: DC

## 2023-05-16 MED ORDER — SEMAGLUTIDE (1 MG/DOSE) 4 MG/3ML ~~LOC~~ SOPN: 1.0000 mg | PEN_INJECTOR | SUBCUTANEOUS | 6 refills | Status: DC

## 2023-05-16 MED ORDER — ALLOPURINOL 300 MG PO TABS: 300.0000 mg | ORAL_TABLET | Freq: Every day | ORAL | 1 refills | Status: DC

## 2023-05-16 MED ORDER — METFORMIN HCL ER 500 MG PO TB24: 500.0000 mg | ORAL_TABLET | Freq: Every day | ORAL | 1 refills | Status: DC

## 2023-05-16 MED ORDER — VENTOLIN HFA 108 (90 BASE) MCG/ACT IN AERS: INHALATION_SPRAY | RESPIRATORY_TRACT | 6 refills | Status: DC

## 2023-05-16 MED ORDER — TRELEGY ELLIPTA 100-62.5-25 MCG/ACT IN AEPB: INHALATION_SPRAY | RESPIRATORY_TRACT | 1 refills | Status: DC

## 2023-05-16 MED ORDER — NORTRIPTYLINE HCL 10 MG PO CAPS: 10.0000 mg | ORAL_CAPSULE | Freq: Every day | ORAL | 1 refills | Status: DC

## 2023-05-16 MED ORDER — WARFARIN SODIUM 4 MG PO TABS: ORAL_TABLET | ORAL | 3 refills | Status: DC

## 2023-05-16 NOTE — Assessment & Plan Note (Signed)
Under good control on current regimen. Continue current regimen. Continue to monitor. Call with any concerns. Refills given. Labs drawn today.   

## 2023-05-16 NOTE — Progress Notes (Addendum)
BP 99/73 (BP Location: Left Arm, Patient Position: Sitting, Cuff Size: Normal)   Pulse 91   Temp 98.5 F (36.9 C) (Oral)   Ht 5\' 6"  (1.676 m)   Wt 264 lb 12.8 oz (120.1 kg)   SpO2 96%   BMI 42.74 kg/m    Subjective:    Patient ID: Roberto [name], male    DOB: 1956-04-12, 67 y.o.   MRN: [id number]  HPI: Roberto [name] is a 67 y.o. male presenting on 05/16/2023 for comprehensive medical examination. Current medical complaints include:  DIABETES Hypoglycemic episodes:no Polydipsia/polyuria: no Visual disturbance: no Chest pain: no Paresthesias: no Glucose Monitoring: yes  Accucheck frequency: Daily Taking Insulin?: no Blood Pressure Monitoring: rarely Retinal Examination: Not up to Date Foot Exam: Up to Date Diabetic Education: Completed Pneumovax: Up to Date Influenza: Up to Date Aspirin: no  No gout flares. Tolerating medicine well.   HYPERTENSION / HYPERLIPIDEMIA Satisfied with current treatment? yes Duration of hypertension: chronic BP monitoring frequency: rarely BP medication side effects: no Past BP meds: lisinopril Duration of hyperlipidemia: chronic Cholesterol medication side effects: no Cholesterol supplements: fish oil Past cholesterol medications: crestor Medication compliance: excellent compliance Aspirin: no Recent stressors: no Recurrent headaches: no Visual changes: no Palpitations: no Dyspnea: no Chest pain: no Lower extremity edema: no Dizzy/lightheaded: no   He currently lives with: room mate Interim Problems from his last visit: no  Depression Screen done today and results listed below:     05/16/2023   10:08 PM 04/10/2023    1:23 PM 02/06/2023    9:15 AM 02/02/2023   10:42 AM 11/13/2022   10:47 AM  Depression screen PHQ 2/9  Decreased Interest 2 1 2 3 1   Down, Depressed, Hopeless 2 2 3 3 2   PHQ - 2 Score 4 3 5 6 3   Altered sleeping 3 2 3 3 2   Tired, decreased energy 3 2 3 3 2   Change in appetite 0 1 0 3 0  Feeling bad or  failure about yourself  3 2 2 3 2   Trouble concentrating  0 0 3 0  Moving slowly or fidgety/restless 1 1 1 3  0  Suicidal thoughts 1 2 1 3 1   PHQ-9 Score 15 13 15 27 10   Difficult doing work/chores Very difficult Somewhat difficult Not difficult at all Extremely dIfficult Somewhat difficult    Past Medical History:  Past Medical History:  Diagnosis Date   Bicuspid aortic valve    a. s/p #27 Carbomedics mechanical valve on 03/25/2010; b. on Coumadin; c. TTE 12/17: EF 40-45%, moderately dilated LV with moderate LVH, AVR well-seated with 14 mmHg gradient, peak AV velocity 2.5 m/s, mild mitral valve thickening with mild MR, mildly dilated RV with mildly reduced contraction   Cellulitis    Chronic kidney disease    Chronic systolic CHF (congestive heart failure) (HCC)    a. R/LHC 03/2010 showed no significant CAD, LVEDP 31 mmHg, mean AoV gradient 34 mmHg at rest and 47 mmHg with dobutamine 20 mcg/kg/min, AVA 1.0 cm^2, RA 31, RV 68/25, PA 68/47, PCWP 38. PA sat 65%. CO 6.2 L/min (Fick) and 5.3 L/min (thermodilution)   Clotting disorder (HCC)    H/O mechanical aortic valve replacement 03/25/2010   a. #27 Carbomedics mechanical valve   Hypercholesterolemia    Renal infarct [facility]) 2017   Multiple right renal infarcts, likely embolic.   Stroke [facility])    TIA (transient ischemic attack) 05/2014    Surgical History:  Past Surgical History:  Procedure Laterality  Date   AORTIC VALVE REPLACEMENT     CARDIAC CATHETERIZATION  03/21/2010   No significant CAD. Severe aortic stenosis. Severely elevated left and right heart filling pressures.   CARDIAC SURGERY  2009   CHF   CARPAL TUNNEL RELEASE Left 2005   RIGHT HEART CATH AND CORONARY ANGIOGRAPHY N/A 01/28/2019   Procedure: RIGHT HEART CATH AND CORONARY ANGIOGRAPHY;  Surgeon: Yvonne Kendall, MD;  Location: [facility] INVASIVE CV LAB;  Service: Cardiovascular;  Laterality: N/A;   TONSILLECTOMY  1962    Medications:  Current Outpatient Medications on File  Prior to Visit  Medication Sig   albuterol (PROVENTIL) (2.5 MG/3ML) 0.083% nebulizer solution USE 1 VIAL IN NEBULIZER EVERY 6 HOURS AS NEEDED FOR WHEEZING FOR SHORTNESS OF BREATH   aspirin 81 MG chewable tablet Chew 81 mg by mouth daily.   buPROPion (WELLBUTRIN SR) 150 MG 12 hr tablet Take 1 pill in the AM for 3 days, then increase to 1 pill BID. Pick a day in the 2nd week to quit   cyclobenzaprine (FLEXERIL) 10 MG tablet Take 1 tablet (10 mg total) by mouth 3 times/day as needed-between meals & bedtime for muscle spasms.   dapagliflozin propanediol (FARXIGA) 10 MG TABS tablet Take 1 tablet (10 mg total) by mouth daily.   fenofibrate (TRICOR) 48 MG tablet Take 1 tablet (48 mg total) by mouth daily.   furosemide (LASIX) 80 MG tablet Take 1 tablet (80 mg total) by mouth daily.   lisinopril (ZESTRIL) 2.5 MG tablet Take 1 tablet (2.5 mg total) by mouth daily.   metoprolol succinate (TOPROL-XL) 100 MG 24 hr tablet Take 1 tablet (100 mg total) by mouth daily.   nicotine (NICODERM CQ) 7 mg/24hr patch Place 1 patch (7 mg total) onto the skin daily.   Omega-3 Fatty Acids (FISH OIL) 1000 MG CAPS Take 5,000 mg by mouth daily.   potassium chloride SA (KLOR-CON M) 20 MEQ tablet Take 1 tablet (20 mEq total) by mouth daily.   rosuvastatin (CRESTOR) 40 MG tablet Take 1 tablet (40 mg total) by mouth daily.   No current facility-administered medications on file prior to visit.    Allergies:  No Known Allergies  Social History:  Social History   Socioeconomic History   Marital status: Single    Spouse name: Not on file   Number of children: 1   Years of education: 4   Highest education level: Associate degree: academic program  Occupational History   Occupation: Disabled    Employer: UNEMPLOYED  Tobacco Use   Smoking status: Former    Current packs/day: 0.00    Average packs/day: 0.5 packs/day for 46.0 years (23.0 ttl pk-yrs)    Types: Cigarettes    Start date: 07/16/2017    Quit date: 05/11/2019     Years since quitting: 4.0   Smokeless tobacco: Never  Vaping Use   Vaping status: Never Used  Substance and Sexual Activity   Alcohol use: No    Alcohol/week: 0.0 standard drinks of alcohol   Drug use: No   Sexual activity: Not Currently  Other Topics Concern   Not on file  Social History Narrative   Not on file   Social Determinants of Health   Financial Resource Strain: Low Risk  (08/21/2022)   Overall Financial Resource Strain (CARDIA)    Difficulty of Paying Living Expenses: Not very hard  Food Insecurity: Food Insecurity Present (01/01/2023)   [facility]   Hunger Vital Sign  Worried About Programme researcher, broadcasting/film/video in the Last Year: Sometimes true    Ran Out of Food in the Last Year: Sometimes true  Transportation Needs: Unmet Transportation Needs (01/01/2023)   [facility]   [facility] - Transportation    Lack of Transportation (Medical): Yes    Lack of Transportation (Non-Medical): Yes  Physical Activity: Inactive (08/21/2022)   Exercise Vital Sign    Days of Exercise per Week: 0 days    Minutes of Exercise per Session: 0 min  Stress: No Stress Concern Present (08/21/2022)   [employer] of Occupational Health - Occupational Stress Questionnaire    Feeling of Stress : Only a little  Social Connections: Socially Isolated (08/21/2022)   Social Connection and Isolation Panel [NHANES]    Frequency of Communication with Friends and Family: More than three times a week    Frequency of Social Gatherings with Friends and Family: Never    Attends Religious Services: Never    Database administrator or Organizations: No    Attends Banker Meetings: Never    Marital Status: Divorced  Catering manager Violence: Not At Risk (08/21/2022)   Humiliation, Afraid, Rape, and Kick questionnaire    Fear of Current or Ex-Partner: No    Emotionally Abused: No    Physically Abused: No    Sexually  Abused: No   Social History   Tobacco Use  Smoking Status Former   Current packs/day: 0.00   Average packs/day: 0.5 packs/day for 46.0 years (23.0 ttl pk-yrs)   Types: Cigarettes   Start date: 07/16/2017   Quit date: 05/11/2019   Years since quitting: 4.0  Smokeless Tobacco Never   Social History   Substance and Sexual Activity  Alcohol Use No   Alcohol/week: 0.0 standard drinks of alcohol    Family History:  Family History  Problem Relation Age of Onset   Arthritis Mother    Dementia Mother    Colon cancer Mother    Arthritis Father    Diabetes Father    Stroke Father    Colon cancer Father    Heart attack Brother    Breast cancer Sister    Seizures Sister    Cancer Brother        brain   Heart disease Brother    Heart attack Brother     Past medical history, surgical history, medications, allergies, family history and social history reviewed with patient today and changes made to appropriate areas of the chart.   Review of Systems  Constitutional: Negative.   HENT: Negative.    Eyes:  Positive for blurred vision. Negative for double vision, photophobia, pain, discharge and redness.  Respiratory:  Positive for cough. Negative for hemoptysis, sputum production, shortness of breath and wheezing.   Cardiovascular: Negative.   Gastrointestinal: Negative.   Genitourinary: Negative.   Musculoskeletal: Negative.   Skin: Negative.   Neurological:  Positive for dizziness and tingling (in the tips of his fingers). Negative for tremors, sensory change, speech change, focal weakness, seizures, loss of consciousness, weakness and headaches.  Endo/Heme/Allergies: Negative.  Negative for environmental allergies and polydipsia. Does not bruise/bleed easily.  Psychiatric/Behavioral:  Negative for depression, hallucinations, memory loss, substance abuse and suicidal ideas. The patient is nervous/anxious. The patient does not have insomnia.    All other ROS negative except what is  listed above and in the HPI.      Objective:    BP 99/73 (BP Location: Left Arm, Patient  Position: Sitting, Cuff Size: Normal)   Pulse 91   Temp 98.5 F (36.9 C) (Oral)   Ht 5\' 6"  (1.676 m)   Wt 264 lb 12.8 oz (120.1 kg)   SpO2 96%   BMI 42.74 kg/m   Wt Readings from Last 3 Encounters:  05/16/23 264 lb 12.8 oz (120.1 kg)  04/10/23 264 lb 3.2 oz (119.8 kg)  04/06/23 257 lb 2 oz (116.6 kg)    Physical Exam Vitals and nursing note reviewed.  Constitutional:      General: He is not in acute distress.    Appearance: Normal appearance. He is obese. He is not ill-appearing, toxic-appearing or diaphoretic.  HENT:     Head: Normocephalic and atraumatic.     Right Ear: Tympanic membrane, ear canal and external ear normal. There is no impacted cerumen.     Left Ear: Tympanic membrane, ear canal and external ear normal. There is no impacted cerumen.     Nose: Nose normal. No congestion or rhinorrhea.     Mouth/Throat:     Mouth: Mucous membranes are moist.     Pharynx: Oropharynx is clear. No oropharyngeal exudate or posterior oropharyngeal erythema.  Eyes:     General: No scleral icterus.       Right eye: No discharge.        Left eye: No discharge.     Extraocular Movements: Extraocular movements intact.     Conjunctiva/sclera: Conjunctivae normal.     Pupils: Pupils are equal, round, and reactive to light.  Neck:     Vascular: No carotid bruit.  Cardiovascular:     Rate and Rhythm: Normal rate and regular rhythm.     Pulses: Normal pulses.     Heart sounds: Murmur heard.     No friction rub. No gallop.  Pulmonary:     Effort: Pulmonary effort is normal. No respiratory distress.     Breath sounds: Normal breath sounds. No stridor. No wheezing, rhonchi or rales.  Chest:     Chest wall: No tenderness.  Abdominal:     General: Abdomen is flat. Bowel sounds are normal. There is no distension.     Palpations: Abdomen is soft. There is no mass.     Tenderness: There is no  abdominal tenderness. There is no right CVA tenderness, left CVA tenderness, guarding or rebound.     Hernia: No hernia is present.  Genitourinary:    Comments: Genital exam deferred with shared decision making Musculoskeletal:        General: No swelling, tenderness, deformity or signs of injury.     Cervical back: Normal range of motion and neck supple. No rigidity. No muscular tenderness.     Right lower leg: No edema.     Left lower leg: No edema.  Lymphadenopathy:     Cervical: No cervical adenopathy.  Skin:    General: Skin is warm and dry.     Capillary Refill: Capillary refill takes less than 2 seconds.     Coloration: Skin is not jaundiced or pale.     Findings: No bruising, erythema, lesion or rash.  Neurological:     General: No focal deficit present.     Mental Status: He is alert and oriented to person, place, and time.     Cranial Nerves: No cranial nerve deficit.     Sensory: No sensory deficit.     Motor: No weakness.     Coordination: Coordination normal.     Gait: Gait normal.  Deep Tendon Reflexes: Reflexes normal.  Psychiatric:        Mood and Affect: Mood normal.        Behavior: Behavior normal.        Thought Content: Thought content normal.        Judgment: Judgment normal.     Results for orders placed or performed in visit on 05/16/23  Microscopic Examination   BLD  Result Value Ref Range   WBC, UA None seen 0 - 5 /hpf   RBC, Urine 0-2 0 - 2 /hpf   Epithelial Cells (non renal) None seen 0 - 10 /hpf   Bacteria, UA None seen None seen/Few  Bayer DCA Hb A1c Waived  Result Value Ref Range   HB A1C (BAYER DCA - WAIVED) 6.1 (H) 4.8 - 5.6 %  Urinalysis, Routine w reflex microscopic  Result Value Ref Range   Specific Gravity, UA 1.010 1.005 - 1.030   pH, UA 5.5 5.0 - 7.5   Color, UA Yellow Yellow   Appearance Ur Clear Clear   Leukocytes,UA Negative Negative   Protein,UA Negative Negative/Trace   Glucose, UA 2+ (A) Negative   Ketones, UA  Negative Negative   RBC, UA Trace (A) Negative   Bilirubin, UA Negative Negative   Urobilinogen, Ur 0.2 0.2 - 1.0 mg/dL   Nitrite, UA Negative Negative   Microscopic Examination See below:   Microalbumin, Urine Waived  Result Value Ref Range   Microalb, Ur Waived 10 0 - 19 mg/L   Creatinine, Urine Waived 50 10 - 300 mg/dL   Microalb/Creat Ratio <30 <30 mg/g   *Note: Due to a large number of results and/or encounters for the requested time period, some results have not been displayed. A complete set of results can be found in Results Review.      Assessment & Plan:   Problem List Items Addressed This Visit       Cardiovascular and Mediastinum   Hypertension associated with diabetes (HCC)    Under good control on current regimen. Continue current regimen. Continue to monitor. Call with any concerns. Refills given. Labs drawn today.       Relevant Medications   metFORMIN (GLUCOPHAGE-XR) 500 MG 24 hr tablet   Semaglutide, 1 MG/DOSE, 4 MG/3ML SOPN   warfarin (COUMADIN) 4 MG tablet   warfarin (COUMADIN) 7.5 MG tablet   Other Relevant Orders   Comprehensive metabolic panel   CBC with Differential/Platelet   TSH   Type 2 diabetes mellitus with cardiac complication (HCC)    Doing well with A1c of 6.1. Continue current regimen. Continue to monitor. Call with any concerns.       Relevant Medications   metFORMIN (GLUCOPHAGE-XR) 500 MG 24 hr tablet   Semaglutide, 1 MG/DOSE, 4 MG/3ML SOPN   warfarin (COUMADIN) 4 MG tablet   warfarin (COUMADIN) 7.5 MG tablet   Other Relevant Orders   Bayer DCA Hb A1c Waived (Completed)   Comprehensive metabolic panel   CBC with Differential/Platelet   Urinalysis, Routine w reflex microscopic (Completed)   Microalbumin, Urine Waived (Completed)   Ambulatory referral to Ophthalmology   Microscopic Examination (Completed)   Coronary artery disease involving native coronary artery of native heart without angina pectoris    Continue to follow with  cardiology. Continue to monitor. Call with any concerns.       Relevant Medications   warfarin (COUMADIN) 4 MG tablet   warfarin (COUMADIN) 7.5 MG tablet     Respiratory   COPD (chronic  obstructive pulmonary disease) (HCC)    Under good control on current regimen. Continue current regimen. Continue to monitor. Call with any concerns. Refills given. Labs drawn today.       Relevant Medications   TRELEGY ELLIPTA 100-62.5-25 MCG/ACT AEPB   VENTOLIN HFA 108 (90 Base) MCG/ACT inhaler     Endocrine   Hyperlipidemia associated with type 2 diabetes mellitus (HCC)    Under good control on current regimen. Continue current regimen. Continue to monitor. Call with any concerns. Refills given. Labs drawn today.       Relevant Medications   metFORMIN (GLUCOPHAGE-XR) 500 MG 24 hr tablet   Semaglutide, 1 MG/DOSE, 4 MG/3ML SOPN   warfarin (COUMADIN) 4 MG tablet   warfarin (COUMADIN) 7.5 MG tablet   Other Relevant Orders   Comprehensive metabolic panel   CBC with Differential/Platelet   Lipid Panel w/o Chol/HDL Ratio     Other   Chronic gout without tophus    Under good control on current regimen. Continue current regimen. Continue to monitor. Call with any concerns. Refills given. Labs drawn today.        Relevant Orders   Uric acid   Other Visit Diagnoses     Routine general medical examination at a health care facility    -  Primary   Vaccines up to date/declined. Screening labs checked today. Cologuard ordered today. Continue diet and exercise. Call with any concerns.   Hesitancy       PSA drawn today.   Relevant Orders   PSA   Microscopic Examination (Completed)   Blurred vision       Referral to opthalmology placed today.   Relevant Orders   Ambulatory referral to Ophthalmology   Screening for colon cancer       Cologuard ordered today.   Relevant Orders   Cologuard        LABORATORY TESTING:  Health maintenance labs ordered today as discussed above.   The natural  history of prostate cancer and ongoing controversy regarding screening and potential treatment outcomes of prostate cancer has been discussed with the patient. The meaning of a false positive PSA and a false negative PSA has been discussed. He indicates understanding of the limitations of this screening test and wishes to proceed with screening PSA testing.   IMMUNIZATIONS:   - Tdap: Tetanus vaccination status reviewed: last tetanus booster within 10 years. - Influenza: Administered today - Pneumovax: Up to date - Prevnar: Up to date - COVID: Refused - HPV: Not applicable - Shingrix vaccine: Refused  SCREENING: - Colonoscopy: cologuard ordered today  Discussed with patient purpose of the colonoscopy is to detect colon cancer at curable precancerous or early stages   PATIENT COUNSELING:    Sexuality: Discussed sexually transmitted diseases, partner selection, use of condoms, avoidance of unintended pregnancy  and contraceptive alternatives.   Advised to avoid cigarette smoking.  I discussed with the patient that most people either abstain from alcohol or drink within safe limits (<=14/week and <=4 drinks/occasion for males, <=7/weeks and <= 3 drinks/occasion for females) and that the risk for alcohol disorders and other health effects rises proportionally with the number of drinks per week and how often a drinker exceeds daily limits.  Discussed cessation/primary prevention of drug use and availability of treatment for abuse.   Diet: Encouraged to adjust caloric intake to maintain  or achieve ideal body weight, to reduce intake of dietary saturated fat and total fat, to limit sodium intake by avoiding high sodium  foods and not adding table salt, and to maintain adequate dietary potassium and calcium preferably from fresh fruits, vegetables, and low-fat dairy products.    stressed the importance of regular exercise  Injury prevention: Discussed safety belts, safety helmets, smoke  detector, smoking near bedding or upholstery.   Dental health: Discussed importance of regular tooth brushing, flossing, and dental visits.   Follow up plan: NEXT PREVENTATIVE PHYSICAL DUE IN 1 YEAR. Return in about 6 months (around 11/13/2023).

## 2023-05-16 NOTE — Assessment & Plan Note (Signed)
Continue to follow with cardiology. Continue to monitor. Call with any concerns.

## 2023-05-16 NOTE — Assessment & Plan Note (Signed)
Doing well with A1c of 6.1. Continue current regimen. Continue to monitor. Call with any concerns.  

## 2023-05-17 LAB — COMPREHENSIVE METABOLIC PANEL
ALT: 18 [IU]/L (ref 0–44)
AST: 18 [IU]/L (ref 0–40)
Albumin: 4.3 g/dL (ref 3.9–4.9)
Alkaline Phosphatase: 57 [IU]/L (ref 44–121)
BUN/Creatinine Ratio: 18 (ref 10–24)
BUN: 24 mg/dL (ref 8–27)
Bilirubin Total: 0.3 mg/dL (ref 0.0–1.2)
CO2: 22 mmol/L (ref 20–29)
Calcium: 9.7 mg/dL (ref 8.6–10.2)
Chloride: 103 mmol/L (ref 96–106)
Creatinine, Ser: 1.3 mg/dL — ABNORMAL HIGH (ref 0.76–1.27)
Globulin, Total: 3.2 g/dL (ref 1.5–4.5)
Glucose: 75 mg/dL (ref 70–99)
Potassium: 3.7 mmol/L (ref 3.5–5.2)
Sodium: 144 mmol/L (ref 134–144)
Total Protein: 7.5 g/dL (ref 6.0–8.5)
eGFR: 60 mL/min/{1.73_m2} (ref >59)

## 2023-05-17 LAB — CBC WITH DIFFERENTIAL/PLATELET
Basophils Absolute: 0.1 10*3/uL (ref 0.0–0.2)
Basos: 1 %
EOS (ABSOLUTE): 0.2 10*3/uL (ref 0.0–0.4)
Eos: 3 %
Hematocrit: 46.9 % (ref 37.5–51.0)
Hemoglobin: 14.7 g/dL (ref 13.0–17.7)
Immature Grans (Abs): 0 10*3/uL (ref 0.0–0.1)
Immature Granulocytes: 0 %
Lymphocytes Absolute: 1.7 10*3/uL (ref 0.7–3.1)
Lymphs: 22 %
MCH: 27.6 pg (ref 26.6–33.0)
MCHC: 31.3 g/dL — ABNORMAL LOW (ref 31.5–35.7)
MCV: 88 fL (ref 79–97)
Monocytes Absolute: 0.8 10*3/uL (ref 0.1–0.9)
Monocytes: 10 %
Neutrophils Absolute: 4.9 10*3/uL (ref 1.4–7.0)
Neutrophils: 64 %
Platelets: 256 10*3/uL (ref 150–450)
RBC: 5.33 x10E6/uL (ref 4.14–5.80)
RDW: 14.4 % (ref 11.6–15.4)
WBC: 7.7 10*3/uL (ref 3.4–10.8)

## 2023-05-17 LAB — LIPID PANEL W/O CHOL/HDL RATIO
Cholesterol, Total: 141 mg/dL (ref 100–199)
HDL: 43 mg/dL (ref >39)
LDL Chol Calc (NIH): 65 mg/dL (ref 0–99)
Triglycerides: 200 mg/dL — ABNORMAL HIGH (ref 0–149)
VLDL Cholesterol Cal: 33 mg/dL (ref 5–40)

## 2023-05-17 LAB — PSA: Prostate Specific Ag, Serum: 0.2 ng/mL (ref 0.0–4.0)

## 2023-05-17 LAB — URIC ACID: Uric Acid: 5.9 mg/dL (ref 3.8–8.4)

## 2023-05-17 LAB — TSH: TSH: 1.85 u[IU]/mL (ref 0.450–4.500)

## 2023-05-19 NOTE — Telephone Encounter (Signed)
Referral sent to [facility] or [facility] per the referral comments.

## 2023-05-29 LAB — COLOGUARD: COLOGUARD: POSITIVE — AB

## 2023-05-30 ENCOUNTER — Other Ambulatory Visit: Payer: Self-pay | Admitting: Internal Medicine

## 2023-05-30 ENCOUNTER — Other Ambulatory Visit: Payer: Self-pay | Admitting: Family Medicine

## 2023-05-30 DIAGNOSIS — R195 Other fecal abnormalities: Secondary | ICD-10-CM

## 2023-05-30 DIAGNOSIS — Z1211 Encounter for screening for malignant neoplasm of colon: Secondary | ICD-10-CM

## 2023-05-30 NOTE — Telephone Encounter (Signed)
discontinued on 12/26/2021 by [name], DO   Requested Prescriptions  Refused Prescriptions Disp Refills   allopurinol (ZYLOPRIM) 100 MG tablet [Pharmacy Med Name: Allopurinol 100 MG Oral Tablet] 90 tablet 0    Sig: Take 1 tablet by mouth once daily     Endocrinology:  Gout Agents - allopurinol Failed - 05/30/2023  8:25 AM      Failed - Cr in normal range and within 360 days    Creatinine  Date Value Ref Range Status  05/25/2014 0.85 0.60 - 1.30 mg/dL Final   Creatinine, Ser  Date Value Ref Range Status  05/16/2023 1.30 (H) 0.76 - 1.27 mg/dL Final         Failed - CBC within normal limits and completed in the last 12 months    WBC  Date Value Ref Range Status  05/16/2023 7.7 3.4 - 10.8 x10E3/uL Final  01/28/2023 9.0 4.0 - 10.5 K/uL Final   RBC  Date Value Ref Range Status  05/16/2023 5.33 4.14 - 5.80 x10E6/uL Final  01/28/2023 4.76 4.22 - 5.81 MIL/uL Final   Hemoglobin  Date Value Ref Range Status  05/16/2023 14.7 13.0 - 17.7 g/dL Final   Hematocrit  Date Value Ref Range Status  05/16/2023 46.9 37.5 - 51.0 % Final   MCHC  Date Value Ref Range Status  05/16/2023 31.3 (L) 31.5 - 35.7 g/dL Final  16/04/9603 54.0 30.0 - 36.0 g/dL Final   [facility]  Date Value Ref Range Status  05/16/2023 27.6 26.6 - 33.0 pg Final  01/28/2023 27.9 26.0 - 34.0 pg Final   MCV  Date Value Ref Range Status  05/16/2023 88 79 - 97 fL Final  05/25/2014 88 80 - 100 fL Final   No results found for: "PLTCOUNTKUC", "LABPLAT", "POCPLA" RDW  Date Value Ref Range Status  05/16/2023 14.4 11.6 - 15.4 % Final  05/25/2014 13.9 11.5 - 14.5 % Final         Passed - Uric Acid in normal range and within 360 days    Uric Acid  Date Value Ref Range Status  05/16/2023 5.9 3.8 - 8.4 mg/dL Final    Comment:               Therapeutic target for gout patients: <6.0         Passed - Valid encounter within last 12 months    Recent Outpatient Visits           2 weeks ago Routine general medical  examination at a health care facility   [facility] [place], [name], DO   1 month ago Tobacco abuse   [place] [facility] [place], [name], DO   3 months ago Anticoagulated on Coumadin   [place] [facility] [place], Sherran Needs, NP   3 months ago Anticoagulated on Coumadin   [place] [facility] [place], Sherran Needs, NP   3 months ago Anticoagulated on Coumadin   [place] [facility] [place], Sherran Needs, NP       Future Appointments             In 1 month End, Cristal Deer, MD [facility] [place]   In 5 months [name], DO [place] [facility]

## 2023-06-02 ENCOUNTER — Encounter: Payer: Self-pay | Admitting: Family Medicine

## 2023-06-17 ENCOUNTER — Inpatient Hospital Stay
Admission: EM | Admit: 2023-06-17 | Discharge: 2023-06-22 | DRG: 286 | Disposition: A | Discharge status: Other Acute Inpatient Hospital | Payer: 59 | Attending: Internal Medicine | Admitting: Internal Medicine

## 2023-06-17 ENCOUNTER — Emergency Department: Payer: 59

## 2023-06-17 ENCOUNTER — Other Ambulatory Visit: Payer: Self-pay

## 2023-06-17 DIAGNOSIS — I4510 Unspecified right bundle-branch block: Secondary | ICD-10-CM | POA: Diagnosis present

## 2023-06-17 DIAGNOSIS — I959 Hypotension, unspecified: Secondary | ICD-10-CM

## 2023-06-17 DIAGNOSIS — E7800 Pure hypercholesterolemia, unspecified: Secondary | ICD-10-CM | POA: Diagnosis present

## 2023-06-17 DIAGNOSIS — I472 Ventricular tachycardia, unspecified: Secondary | ICD-10-CM | POA: Diagnosis present

## 2023-06-17 DIAGNOSIS — I5032 Chronic diastolic (congestive) heart failure: Secondary | ICD-10-CM | POA: Diagnosis not present

## 2023-06-17 DIAGNOSIS — N179 Acute kidney failure, unspecified: Secondary | ICD-10-CM

## 2023-06-17 DIAGNOSIS — N189 Chronic kidney disease, unspecified: Secondary | ICD-10-CM | POA: Diagnosis not present

## 2023-06-17 DIAGNOSIS — I7121 Aneurysm of the ascending aorta, without rupture: Secondary | ICD-10-CM | POA: Diagnosis present

## 2023-06-17 DIAGNOSIS — M109 Gout, unspecified: Secondary | ICD-10-CM | POA: Diagnosis present

## 2023-06-17 DIAGNOSIS — I4891 Unspecified atrial fibrillation: Secondary | ICD-10-CM | POA: Diagnosis present

## 2023-06-17 DIAGNOSIS — Z95810 Presence of automatic (implantable) cardiac defibrillator: Secondary | ICD-10-CM

## 2023-06-17 DIAGNOSIS — Z952 Presence of prosthetic heart valve: Secondary | ICD-10-CM

## 2023-06-17 DIAGNOSIS — Z79899 Other long term (current) drug therapy: Secondary | ICD-10-CM | POA: Diagnosis not present

## 2023-06-17 DIAGNOSIS — I2489 Other forms of acute ischemic heart disease: Secondary | ICD-10-CM | POA: Diagnosis present

## 2023-06-17 DIAGNOSIS — I493 Ventricular premature depolarization: Secondary | ICD-10-CM | POA: Diagnosis present

## 2023-06-17 DIAGNOSIS — Z8249 Family history of ischemic heart disease and other diseases of the circulatory system: Secondary | ICD-10-CM | POA: Diagnosis not present

## 2023-06-17 DIAGNOSIS — I428 Other cardiomyopathies: Secondary | ICD-10-CM | POA: Diagnosis present

## 2023-06-17 DIAGNOSIS — I4729 Other ventricular tachycardia: Principal | ICD-10-CM | POA: Diagnosis present

## 2023-06-17 DIAGNOSIS — F17200 Nicotine dependence, unspecified, uncomplicated: Secondary | ICD-10-CM | POA: Diagnosis present

## 2023-06-17 DIAGNOSIS — Z5982 Transportation insecurity: Secondary | ICD-10-CM

## 2023-06-17 DIAGNOSIS — Z7901 Long term (current) use of anticoagulants: Secondary | ICD-10-CM | POA: Diagnosis not present

## 2023-06-17 DIAGNOSIS — I272 Pulmonary hypertension, unspecified: Secondary | ICD-10-CM | POA: Diagnosis present

## 2023-06-17 DIAGNOSIS — F3289 Other specified depressive episodes: Secondary | ICD-10-CM | POA: Diagnosis not present

## 2023-06-17 DIAGNOSIS — I2510 Atherosclerotic heart disease of native coronary artery without angina pectoris: Secondary | ICD-10-CM | POA: Diagnosis present

## 2023-06-17 DIAGNOSIS — Z8673 Personal history of transient ischemic attack (TIA), and cerebral infarction without residual deficits: Secondary | ICD-10-CM

## 2023-06-17 DIAGNOSIS — I5022 Chronic systolic (congestive) heart failure: Secondary | ICD-10-CM | POA: Diagnosis present

## 2023-06-17 DIAGNOSIS — Z7984 Long term (current) use of oral hypoglycemic drugs: Secondary | ICD-10-CM | POA: Diagnosis not present

## 2023-06-17 DIAGNOSIS — F32A Depression, unspecified: Secondary | ICD-10-CM | POA: Diagnosis present

## 2023-06-17 DIAGNOSIS — I480 Paroxysmal atrial fibrillation: Secondary | ICD-10-CM | POA: Diagnosis not present

## 2023-06-17 DIAGNOSIS — I5023 Acute on chronic systolic (congestive) heart failure: Secondary | ICD-10-CM | POA: Diagnosis not present

## 2023-06-17 DIAGNOSIS — N182 Chronic kidney disease, stage 2 (mild): Secondary | ICD-10-CM | POA: Diagnosis present

## 2023-06-17 DIAGNOSIS — Z803 Family history of malignant neoplasm of breast: Secondary | ICD-10-CM

## 2023-06-17 DIAGNOSIS — Z800 Family history of malignant neoplasm of digestive organs: Secondary | ICD-10-CM

## 2023-06-17 DIAGNOSIS — I459 Conduction disorder, unspecified: Secondary | ICD-10-CM | POA: Diagnosis present

## 2023-06-17 DIAGNOSIS — I9589 Other hypotension: Secondary | ICD-10-CM | POA: Diagnosis not present

## 2023-06-17 DIAGNOSIS — Z823 Family history of stroke: Secondary | ICD-10-CM

## 2023-06-17 DIAGNOSIS — I5020 Unspecified systolic (congestive) heart failure: Secondary | ICD-10-CM | POA: Diagnosis not present

## 2023-06-17 DIAGNOSIS — Z6841 Body Mass Index (BMI) 40.0 and over, adult: Secondary | ICD-10-CM | POA: Diagnosis not present

## 2023-06-17 DIAGNOSIS — I071 Rheumatic tricuspid insufficiency: Secondary | ICD-10-CM | POA: Diagnosis present

## 2023-06-17 DIAGNOSIS — Z833 Family history of diabetes mellitus: Secondary | ICD-10-CM

## 2023-06-17 DIAGNOSIS — Z7982 Long term (current) use of aspirin: Secondary | ICD-10-CM

## 2023-06-17 DIAGNOSIS — E1122 Type 2 diabetes mellitus with diabetic chronic kidney disease: Secondary | ICD-10-CM

## 2023-06-17 DIAGNOSIS — G4733 Obstructive sleep apnea (adult) (pediatric): Secondary | ICD-10-CM | POA: Diagnosis present

## 2023-06-17 DIAGNOSIS — Z8261 Family history of arthritis: Secondary | ICD-10-CM

## 2023-06-17 DIAGNOSIS — I429 Cardiomyopathy, unspecified: Secondary | ICD-10-CM | POA: Diagnosis not present

## 2023-06-17 LAB — CBC WITH DIFFERENTIAL/PLATELET
Abs Immature Granulocytes: 0.05 10*3/uL (ref 0.00–0.07)
Basophils Absolute: 0.1 10*3/uL (ref 0.0–0.1)
Basophils Relative: 1 %
Eosinophils Absolute: 0.2 10*3/uL (ref 0.0–0.5)
Eosinophils Relative: 2 %
HCT: 45.8 % (ref 39.0–52.0)
Hemoglobin: 14.9 g/dL (ref 13.0–17.0)
Immature Granulocytes: 1 %
Lymphocytes Relative: 22 %
Lymphs Abs: 2.1 10*3/uL (ref 0.7–4.0)
MCH: 28 pg (ref 26.0–34.0)
MCHC: 32.5 g/dL (ref 30.0–36.0)
MCV: 86.1 fL (ref 80.0–100.0)
Monocytes Absolute: 0.7 10*3/uL (ref 0.1–1.0)
Monocytes Relative: 7 %
Neutro Abs: 6.4 10*3/uL (ref 1.7–7.7)
Neutrophils Relative %: 67 %
Platelets: 255 10*3/uL (ref 150–400)
RBC: 5.32 MIL/uL (ref 4.22–5.81)
RDW: 15.5 % (ref 11.5–15.5)
WBC: 9.6 10*3/uL (ref 4.0–10.5)
nRBC: 0 % (ref 0.0–0.2)

## 2023-06-17 LAB — PROTIME-INR
INR: 2.2 — ABNORMAL HIGH (ref 0.8–1.2)
Prothrombin Time: 24.9 s — ABNORMAL HIGH (ref 11.4–15.2)

## 2023-06-17 LAB — COMPREHENSIVE METABOLIC PANEL
ALT: 113 U/L — ABNORMAL HIGH (ref 0–44)
AST: 183 U/L — ABNORMAL HIGH (ref 15–41)
Albumin: 4 g/dL (ref 3.5–5.0)
Alkaline Phosphatase: 52 U/L (ref 38–126)
Anion gap: 8 (ref 5–15)
BUN: 34 mg/dL — ABNORMAL HIGH (ref 8–23)
CO2: 26 mmol/L (ref 22–32)
Calcium: 9.2 mg/dL (ref 8.9–10.3)
Chloride: 103 mmol/L (ref 98–111)
Creatinine, Ser: 2.15 mg/dL — ABNORMAL HIGH (ref 0.61–1.24)
GFR, Estimated: 33 mL/min — ABNORMAL LOW (ref >60)
Glucose, Bld: 203 mg/dL — ABNORMAL HIGH (ref 70–99)
Potassium: 3.8 mmol/L (ref 3.5–5.1)
Sodium: 137 mmol/L (ref 135–145)
Total Bilirubin: 0.8 mg/dL (ref <1.2)
Total Protein: 7.2 g/dL (ref 6.5–8.1)

## 2023-06-17 LAB — BRAIN NATRIURETIC PEPTIDE: B Natriuretic Peptide: 224.7 pg/mL — ABNORMAL HIGH (ref 0.0–100.0)

## 2023-06-17 LAB — MAGNESIUM: Magnesium: 2.2 mg/dL (ref 1.7–2.4)

## 2023-06-17 LAB — TROPONIN I (HIGH SENSITIVITY): Troponin I (High Sensitivity): 41 ng/L — ABNORMAL HIGH (ref <18)

## 2023-06-17 MED ORDER — POTASSIUM CHLORIDE CRYS ER 20 MEQ PO TBCR
20.0000 meq | EXTENDED_RELEASE_TABLET | Freq: Every day | ORAL | Status: DC
  Administered 2023-06-18 – 2023-06-22 (×5): 20 meq via ORAL
  Filled 2023-06-17 (×6): qty 1

## 2023-06-17 MED ORDER — INSULIN ASPART 100 UNIT/ML IJ SOLN
0.0000 [IU] | Freq: Every day | INTRAMUSCULAR | Status: DC
Start: 2023-06-18 — End: 2023-06-22
  Administered 2023-06-18: 0 [IU] via SUBCUTANEOUS

## 2023-06-17 MED ORDER — ROSUVASTATIN CALCIUM 10 MG PO TABS
40.0000 mg | ORAL_TABLET | Freq: Every day | ORAL | Status: DC
  Administered 2023-06-18 – 2023-06-22 (×5): 40 mg via ORAL
  Filled 2023-06-17: qty 2
  Filled 2023-06-17: qty 4
  Filled 2023-06-17: qty 2
  Filled 2023-06-17 (×2): qty 4

## 2023-06-17 MED ORDER — ALBUTEROL SULFATE (2.5 MG/3ML) 0.083% IN NEBU: 2.5000 mg | INHALATION_SOLUTION | RESPIRATORY_TRACT | Status: DC | PRN

## 2023-06-17 MED ORDER — INSULIN ASPART 100 UNIT/ML IJ SOLN
0.0000 [IU] | Freq: Three times a day (TID) | INTRAMUSCULAR | Status: DC
  Administered 2023-06-21: 2 [IU] via SUBCUTANEOUS
  Filled 2023-06-17 (×2): qty 1

## 2023-06-17 MED ORDER — CYCLOBENZAPRINE HCL 10 MG PO TABS: 10.0000 mg | ORAL_TABLET | Freq: Two times a day (BID) | ORAL | Status: DC | PRN

## 2023-06-17 MED ORDER — FENTANYL CITRATE PF 50 MCG/ML IJ SOSY
50.0000 ug | PREFILLED_SYRINGE | Freq: Once | INTRAMUSCULAR | Status: AC
  Administered 2023-06-17: 50 ug via INTRAVENOUS

## 2023-06-17 MED ORDER — LISINOPRIL 5 MG PO TABS
2.5000 mg | ORAL_TABLET | Freq: Every day | ORAL | Status: DC
  Administered 2023-06-18: 2.5 mg via ORAL
  Filled 2023-06-17: qty 1

## 2023-06-17 MED ORDER — FENTANYL CITRATE PF 50 MCG/ML IJ SOSY
PREFILLED_SYRINGE | INTRAMUSCULAR | Status: AC
  Filled 2023-06-17: qty 1

## 2023-06-17 MED ORDER — DAPAGLIFLOZIN PROPANEDIOL 10 MG PO TABS
10.0000 mg | ORAL_TABLET | Freq: Every day | ORAL | Status: DC
  Administered 2023-06-18 – 2023-06-22 (×5): 10 mg via ORAL
  Filled 2023-06-17 (×5): qty 1

## 2023-06-17 MED ORDER — AMIODARONE HCL IN DEXTROSE 360-4.14 MG/200ML-% IV SOLN
INTRAVENOUS | Status: AC
  Administered 2023-06-18: 30 mg/h via INTRAVENOUS
  Filled 2023-06-17: qty 200

## 2023-06-17 MED ORDER — ACETAMINOPHEN 325 MG PO TABS: 650.0000 mg | ORAL_TABLET | Freq: Four times a day (QID) | ORAL | Status: DC | PRN

## 2023-06-17 MED ORDER — FENOFIBRATE 54 MG PO TABS
54.0000 mg | ORAL_TABLET | Freq: Every day | ORAL | Status: DC
  Administered 2023-06-18 – 2023-06-22 (×5): 54 mg via ORAL
  Filled 2023-06-17 (×5): qty 1

## 2023-06-17 MED ORDER — ONDANSETRON HCL 4 MG/2ML IJ SOLN
INTRAMUSCULAR | Status: AC
  Filled 2023-06-17: qty 2

## 2023-06-17 MED ORDER — WARFARIN SODIUM 4 MG PO TABS: 4.0000 mg | ORAL_TABLET | ORAL | Status: DC

## 2023-06-17 MED ORDER — MAGNESIUM HYDROXIDE 400 MG/5ML PO SUSP: 30.0000 mL | Freq: Every day | ORAL | Status: DC | PRN

## 2023-06-17 MED ORDER — MIDAZOLAM HCL 2 MG/2ML IJ SOLN
1.0000 mg | Freq: Once | INTRAMUSCULAR | Status: AC
  Administered 2023-06-17: 1 mg via INTRAVENOUS

## 2023-06-17 MED ORDER — AMIODARONE LOAD VIA INFUSION
150.0000 mg | Freq: Once | INTRAVENOUS | Status: AC
  Administered 2023-06-17: 150 mg via INTRAVENOUS
  Filled 2023-06-17: qty 83.34

## 2023-06-17 MED ORDER — BUPROPION HCL ER (SR) 150 MG PO TB12
150.0000 mg | ORAL_TABLET | Freq: Two times a day (BID) | ORAL | Status: DC
  Administered 2023-06-18 – 2023-06-22 (×8): 150 mg via ORAL
  Filled 2023-06-17 (×11): qty 1

## 2023-06-17 MED ORDER — AMIODARONE HCL IN DEXTROSE 360-4.14 MG/200ML-% IV SOLN
60.0000 mg/h | INTRAVENOUS | Status: AC
Start: 2023-06-17 — End: 2023-06-18
  Administered 2023-06-17 – 2023-06-18 (×2): 60 mg/h via INTRAVENOUS
  Filled 2023-06-17: qty 200

## 2023-06-17 MED ORDER — SODIUM CHLORIDE 0.9 % IV SOLN: INTRAVENOUS | Status: AC

## 2023-06-17 MED ORDER — AMIODARONE HCL IN DEXTROSE 360-4.14 MG/200ML-% IV SOLN
30.0000 mg/h | INTRAVENOUS | Status: DC
  Administered 2023-06-18 – 2023-06-22 (×9): 30 mg/h via INTRAVENOUS
  Filled 2023-06-17 (×9): qty 200

## 2023-06-17 MED ORDER — MIDAZOLAM HCL 2 MG/2ML IJ SOLN
INTRAMUSCULAR | Status: AC
  Filled 2023-06-17: qty 2

## 2023-06-17 MED ORDER — TRAZODONE HCL 50 MG PO TABS: 25.0000 mg | ORAL_TABLET | Freq: Every evening | ORAL | Status: DC | PRN

## 2023-06-17 MED ORDER — POTASSIUM CHLORIDE CRYS ER 20 MEQ PO TBCR
40.0000 meq | EXTENDED_RELEASE_TABLET | Freq: Once | ORAL | Status: AC
  Administered 2023-06-18: 40 meq via ORAL
  Filled 2023-06-17: qty 2

## 2023-06-17 MED ORDER — MAGNESIUM SULFATE 2 GM/50ML IV SOLN
2.0000 g | Freq: Once | INTRAVENOUS | Status: AC
Start: 2023-06-17 — End: 2023-06-17
  Administered 2023-06-17: 2 g via INTRAVENOUS

## 2023-06-17 MED ORDER — OMEGA-3-ACID ETHYL ESTERS 1 G PO CAPS
1.0000 g | ORAL_CAPSULE | Freq: Every day | ORAL | Status: DC
  Administered 2023-06-18 – 2023-06-22 (×5): 1 g via ORAL
  Filled 2023-06-17 (×5): qty 1

## 2023-06-17 MED ORDER — ASPIRIN 81 MG PO CHEW
81.0000 mg | CHEWABLE_TABLET | Freq: Every day | ORAL | Status: DC
  Administered 2023-06-18 – 2023-06-22 (×4): 81 mg via ORAL
  Filled 2023-06-17 (×4): qty 1

## 2023-06-17 MED ORDER — ALLOPURINOL 100 MG PO TABS
300.0000 mg | ORAL_TABLET | Freq: Every day | ORAL | Status: DC
  Administered 2023-06-18 – 2023-06-22 (×4): 300 mg via ORAL
  Filled 2023-06-17: qty 3
  Filled 2023-06-17 (×2): qty 1
  Filled 2023-06-17: qty 3

## 2023-06-17 MED ORDER — ACETAMINOPHEN 650 MG RE SUPP: 650.0000 mg | Freq: Four times a day (QID) | RECTAL | Status: DC | PRN

## 2023-06-17 MED ORDER — METOPROLOL SUCCINATE ER 50 MG PO TB24
100.0000 mg | ORAL_TABLET | Freq: Every day | ORAL | Status: DC
  Administered 2023-06-18: 100 mg via ORAL
  Filled 2023-06-17: qty 2

## 2023-06-17 MED ORDER — ONDANSETRON HCL 4 MG PO TABS: 4.0000 mg | ORAL_TABLET | Freq: Four times a day (QID) | ORAL | Status: DC | PRN

## 2023-06-17 MED ORDER — FUROSEMIDE 40 MG PO TABS
80.0000 mg | ORAL_TABLET | Freq: Every day | ORAL | Status: DC
  Administered 2023-06-18: 80 mg via ORAL
  Filled 2023-06-17: qty 2

## 2023-06-17 MED ORDER — ONDANSETRON HCL 4 MG/2ML IJ SOLN: 4.0000 mg | Freq: Four times a day (QID) | INTRAMUSCULAR | Status: DC | PRN

## 2023-06-17 NOTE — H&P (Signed)
[place]   PATIENT NAME: Roberto [name]    MR#:  [id number]  DATE OF BIRTH:  19-Sep-1955  DATE OF ADMISSION:  06/17/2023  PRIMARY CARE PHYSICIAN: [name], DO   Patient is coming from: Home  REQUESTING/REFERRING PHYSICIAN: Chesley Noon, MD  CHIEF COMPLAINT:   Chief Complaint  Patient presents with   Palpitations    HISTORY OF PRESENT ILLNESS:  Roberto [name] is a 67 y.o. male with medical history significant for chronic systolic CHF, nonischemic cardiomyopathy,CKD, type 2 diabetes mellitus, bicuspid aortic valve s/p mechanical heart valve replacement and anticoagulation with Coumadin, dyslipidemia, CVA, thoracic aortic aneurysm, and TIA, who presented to the emergency room with acute onset of palpitations which started about an hour before he came to the ER with associated dyspnea without chest pain.  He admitted to lightheadedness and dizziness with this palpitations and denied any loss of consciousness.  No fever or chills.  No nausea or vomiting or abdominal pain.  No cough or wheezing or hemoptysis.  ED Course: When he came to the ER, BP was 132/100 with heart rate of 191 and later 197 and otherwise normal vital signs.  Labs revealed a BUN of 34 and creatinine 2.15 up from previous levels and AST of 183 with ALT 113 and BNP 224.7 with high sensitivity troponin I of 41.  CBC was within normal.  INR was 2.2 and PTT 24.9. EKG as reviewed by me : Initial EKG showed wide QRS tachycardia concerning for ventricular tachycardia with nonspecific intraventricular conduction delay with rate of 191.  Later on repeat EKG showed Atrial fibrillation with a rate of 100 with nonspecific intraventricular conduction delay and nonsustained ventricular tachycardia.  Another repeat KG showed atrial fibrillation with RVR of 102 with nonspecific intraventricular conduction delay. Imaging: Portable chest x-ray showed cardiomegaly with no acute cardiopulmonary disease.  The patient was  given 50 mcg of IV fentanyl, 2 g of IV magnesium sulfate, 40 mill equivalent p.o. potassium chloride and 1 mg of IV Versed to initiate preparing for cardioversion however right before cardioversion this rhythm converted to atrial fibrillation.  He was then started on IV amiodarone with heart rate 50 mg bolus followed by drip.  Dr. Myriam Forehand- [place] was notified and is aware about the patient.  He will be admitted to a progressive unit bed for further evaluation and management. PAST MEDICAL HISTORY:   Past Medical History:  Diagnosis Date   Bicuspid aortic valve    a. s/p #27 [employer] mechanical valve on 03/25/2010; b. on Coumadin; c. TTE 12/17: EF 40-45%, moderately dilated LV with moderate LVH, AVR well-seated with 14 mmHg gradient, peak AV velocity 2.5 m/s, mild mitral valve thickening with mild MR, mildly dilated RV with mildly reduced contraction   Cellulitis    Chronic kidney disease    Chronic systolic CHF (congestive heart failure) (HCC)    a. R/LHC 03/2010 showed no significant CAD, LVEDP 31 mmHg, mean AoV gradient 34 mmHg at rest and 47 mmHg with dobutamine 20 mcg/kg/min, AVA 1.0 cm^2, RA 31, RV 68/25, PA 68/47, PCWP 38. PA sat 65%. CO 6.2 L/min (Fick) and 5.3 L/min (thermodilution)   Clotting disorder (HCC)    H/O mechanical aortic valve replacement 03/25/2010   a. #27 Carbomed[employer] mechanical valve   Hypercholesterolemia    Renal infarct [facility] 2017   Multiple right renal infarcts, likely embolic.   Stroke [facility])    TIA (transient ischemic attack) 05/2014    PAST SURGICAL HISTORY:  Past Surgical History:  Procedure Laterality Date   AORTIC VALVE REPLACEMENT     CARDIAC CATHETERIZATION  03/21/2010   No significant CAD. Severe aortic stenosis. Severely elevated left and right heart filling pressures.   CARDIAC SURGERY  2009   CHF   CARPAL TUNNEL RELEASE Left 2005   RIGHT HEART CATH AND CORONARY ANGIOGRAPHY N/A 01/28/2019   Procedure: RIGHT HEART CATH AND CORONARY ANGIOGRAPHY;   Surgeon: Yvonne Kendall, MD;  Location: [facility] INVASIVE CV LAB;  Service: Cardiovascular;  Laterality: N/A;   TONSILLECTOMY  1962    SOCIAL HISTORY:   Social History   Tobacco Use   Smoking status: Former    Current packs/day: 0.00    Average packs/day: 0.5 packs/day for 46.0 years (23.0 ttl pk-yrs)    Types: Cigarettes    Start date: 07/16/2017    Quit date: 05/11/2019    Years since quitting: 4.1   Smokeless tobacco: Never  Substance Use Topics   Alcohol use: No    Alcohol/week: 0.0 standard drinks of alcohol    FAMILY HISTORY:   Family History  Problem Relation Age of Onset   Arthritis Mother    Dementia Mother    Colon cancer Mother    Arthritis Father    Diabetes Father    Stroke Father    Colon cancer Father    Heart attack Brother    Breast cancer Sister    Seizures Sister    Cancer Brother        brain   Heart disease Brother    Heart attack Brother     DRUG ALLERGIES:  No Known Allergies  REVIEW OF SYSTEMS:   ROS As per history of present illness. All pertinent systems were reviewed above. Constitutional, HEENT, cardiovascular, respiratory, GI, GU, musculoskeletal, neuro, psychiatric, endocrine, integumentary and hematologic systems were reviewed and are otherwise negative/unremarkable except for positive findings mentioned above in the HPI.   MEDICATIONS AT HOME:   Prior to Admission medications   Medication Sig Start Date End Date Taking? Authorizing Provider  albuterol (PROVENTIL) (2.5 MG/3ML) 0.083% nebulizer solution USE 1 VIAL IN NEBULIZER EVERY 6 HOURS AS NEEDED FOR WHEEZING FOR SHORTNESS OF BREATH 06/07/22   [name], [name], DO  allopurinol (ZYLOPRIM) 300 MG tablet Take 1 tablet (300 mg total) by mouth daily. 05/16/23   Olevia Perches P, DO  aspirin 81 MG chewable tablet Chew 81 mg by mouth daily.    [provider]  buPROPion (WELLBUTRIN SR) 150 MG 12 hr tablet Take 1 pill in the AM for 3 days, then increase to 1 pill BID. Pick a day  in the 2nd week to quit 04/10/23   Olevia Perches P, DO  cyclobenzaprine (FLEXERIL) 10 MG tablet Take 1 tablet (10 mg total) by mouth 3 times/day as needed-between meals & bedtime for muscle spasms. 12/10/18   [name], [name], DO  dapagliflozin propanediol (FARXIGA) 10 MG TABS tablet Take 1 tablet (10 mg total) by mouth daily. 04/06/23   End, Cristal Deer, MD  fenofibrate (TRICOR) 48 MG tablet Take 1 tablet (48 mg total) by mouth daily. 12/25/22   End, Cristal Deer, MD  furosemide (LASIX) 80 MG tablet Take 1 tablet (80 mg total) by mouth daily. 04/06/23   End, Cristal Deer, MD  lisinopril (ZESTRIL) 2.5 MG tablet Take 1 tablet (2.5 mg total) by mouth daily. 04/06/23   End, Cristal Deer, MD  metFORMIN (GLUCOPHAGE-XR) 500 MG 24 hr tablet Take 1 tablet (500 mg total) by mouth daily with breakfast. 05/16/23  [name], [name], DO  metoprolol succinate (TOPROL-XL) 100 MG 24 hr tablet Take 1 tablet (100 mg total) by mouth daily. 04/06/23   End, Cristal Deer, MD  nicotine (NICODERM CQ) 7 mg/24hr patch Place 1 patch (7 mg total) onto the skin daily. 04/10/23   [name], [name], DO  nortriptyline (PAMELOR) 10 MG capsule Take 1 capsule (10 mg total) by mouth at bedtime. 05/16/23   [name], [name], DO  Omega-3 Fatty Acids (FISH OIL) 1000 MG CAPS Take 5,000 mg by mouth daily.    [provider]  potassium chloride SA (KLOR-CON M) 20 MEQ tablet Take 1 tablet (20 mEq total) by mouth daily. 04/06/23   End, Cristal Deer, MD  rosuvastatin (CRESTOR) 40 MG tablet Take 1 tablet (40 mg total) by mouth daily. 04/06/23   End, Cristal Deer, MD  Semaglutide, 1 MG/DOSE, 4 MG/3ML SOPN Inject 1 mg as directed once a week. 05/16/23   [name], [name], DO  TRELEGY ELLIPTA 100-62.5-25 MCG/ACT AEPB Inhale 1 puff by mouth once daily 05/16/23   [name], [name], DO  VENTOLIN HFA 108 (90 Base) MCG/ACT inhaler INHALE 2 PUFFS BY MOUTH EVERY 6 HOURS AS NEEDED FOR WHEEZING OR SHORTNESS OF BREATH 05/16/23   [name], [name], DO  warfarin  (COUMADIN) 4 MG tablet TAKE 1 TABLET BY MOUTH ONCE DAILY AT  6PM  AS  DIRECTED 05/16/23   Olevia Perches P, DO  warfarin (COUMADIN) 7.5 MG tablet TAKE 4MG  BY MOUTH FOR 2 DAYS, THEN TAKE 7.5MG  ON THE THIRD DAY, THEN REPEAT 05/16/23   [name], [name], DO      VITAL SIGNS:  Blood pressure (!) 86/65, pulse (!) 123, temperature 98.4 F (36.9 C), temperature source Oral, resp. rate 16, height 5\' 6"  (1.676 m), weight 117.9 kg, SpO2 93%.  PHYSICAL EXAMINATION:  Physical Exam  GENERAL:  67 y.o.-year-old patient lying in the bed with no acute distress.  EYES: Pupils equal, round, reactive to light and accommodation. No scleral icterus. Extraocular muscles intact.  HEENT: Head atraumatic, normocephalic. Oropharynx and nasopharynx clear.  NECK:  Supple, no jugular venous distention. No thyroid enlargement, no tenderness.  LUNGS: Normal breath sounds bilaterally, no wheezing, rales,rhonchi or crepitation. No use of accessory muscles of respiration.  CARDIOVASCULAR: Regular rate and rhythm, S1, S2 normal. No murmurs, rubs, or gallops.  ABDOMEN: Soft, nondistended, nontender. Bowel sounds present. No organomegaly or mass.  EXTREMITIES: No pedal edema, cyanosis, or clubbing.  NEUROLOGIC: Cranial nerves II through XII are intact. Muscle strength 5/5 in all extremities. Sensation intact. Gait not checked.  PSYCHIATRIC: The patient is alert and oriented x 3.  Normal affect and good eye contact. SKIN: No obvious rash, lesion, or ulcer.   LABORATORY PANEL:   CBC Recent Labs  Lab 06/17/23 2110  WBC 9.6  HGB 14.9  HCT 45.8  PLT 255   ------------------------------------------------------------------------------------------------------------------  Chemistries  Recent Labs  Lab 06/17/23 2110  NA 137  K 3.8  CL 103  CO2 26  GLUCOSE 203*  BUN 34*  CREATININE 2.15*  CALCIUM 9.2  MG 2.2  AST 183*  ALT 113*  ALKPHOS 52  BILITOT 0.8    ------------------------------------------------------------------------------------------------------------------  Cardiac Enzymes No results for input(s): "TROPONINI" in the last 168 hours. ------------------------------------------------------------------------------------------------------------------  RADIOLOGY:  DG Chest Portable 1 View Result Date: 06/17/2023 CLINICAL DATA:  Shortness of breath EXAM: PORTABLE CHEST 1 VIEW COMPARISON:  Chest x-ray 11/04/2018 FINDINGS: The heart is enlarged. The lungs are clear. There is no pleural effusion or pneumothorax. No acute  fractures are seen. Sternotomy wires are present. IMPRESSION: Cardiomegaly. No acute cardiopulmonary process. Electronically Signed   By: Darliss Cheney M.D.   On: 06/17/2023 21:56      IMPRESSION AND PLAN:  Assessment and Plan: * Atrial fibrillation with RVR (HCC) - The patient will be admitted to progressive unit bed. - We will continue IV amiodarone drip. - 2D echo and cardiology consult to be obtained. - [facility] group will be notified with the time message for a.m. - Dr. Myriam Forehand- Sandie Ano goes already notified as mentioned above. - Will optimize patient's electrolytes.  NSVT (nonsustained ventricular tachycardia) (HCC) - This has spontaneously converted to atrial fibrillation as mentioned above. - He could have been related to intraventricular duction delay. - We will continue IV amiodarone.  Type 2 diabetes mellitus with chronic kidney disease, without long-term current use of insulin (HCC) - The patient will be placed on supplemental coverage with NovoLog. - We will hold off metformin.  Gout - We will continue allopurinol.  Depression - We will continue Wellbutrin XL  Chronic diastolic heart failure (HCC) - We will continue Farxiga, lisinopril, Lasix and Toprol-XL.  H/O mechanical aortic valve replacement - The patient will be continued on Coumadin and will follow INR.    DVT prophylaxis:  Coumadin Advanced Care Planning:  Code Status: full code. Family Communication:  The plan of care was discussed in details with the patient (and family). I answered all questions. The patient agreed to proceed with the above mentioned plan. Further management will depend upon hospital course. Disposition Plan: Back to previous home environment Consults called: Cardiology All the records are reviewed and case discussed with ED provider.  Status is: Inpatient  At the time of the admission, it appears that the appropriate admission status for this patient is inpatient.  This is judged to be reasonable and necessary in order to provide the required intensity of service to ensure the patient's safety given the presenting symptoms, physical exam findings and initial radiographic and laboratory data in the context of comorbid conditions.  The patient requires inpatient status due to high intensity of service, high risk of further deterioration and high frequency of surveillance required.  I certify that at the time of admission, it is my clinical judgment that the patient will require inpatient hospital care extending more than 2 midnights.                            Dispo: The patient is from: Home              Anticipated d/c is to: Home              Patient currently is not medically stable to d/c.              Difficult to place patient: No  Hannah Beat M.D on 06/17/2023 at 11:36 PM  [facility]   From 7 PM-7 AM, contact night-coverage [email or web address]  CC: Primary care physician; [name], DO

## 2023-06-17 NOTE — Assessment & Plan Note (Signed)
New onset.  Heart rate better on amiodarone drip.  On heparin drip

## 2023-06-17 NOTE — ED Provider Notes (Signed)
[name] Provider Note    Event Date/Time   First MD Initiated Contact with Patient 06/17/23 2116     (approximate)   History   Chief Complaint Palpitations   HPI  Roberto [name] is a 67 y.o. male with past medical history of hyperlipidemia, nonischemic cardiomyopathy, CHF, mechanical aortic valve, and thoracic aortic aneurysm who presents to the ED complaining of palpitations.  Patient reports that about 1 hour prior to arrival he had sudden onset of sensation of his heart racing associated with some mild difficulty breathing.  He denies any pain in his chest, does state he has been feeling lightheaded and dizzy at times.  He denies any history of similar symptoms, reports taking Coumadin for his mechanical heart valve but denies any history of arrhythmia.  He had been feeling well for the past few days prior to onset of symptoms this evening.     Physical Exam   Triage Vital Signs: ED Triage Vitals  Encounter Vitals Group     BP 06/17/23 2111 (!) 172/100     Systolic BP Percentile --      Diastolic BP Percentile --      Pulse Rate 06/17/23 2111 (!) 191     Resp 06/17/23 2111 20     Temp 06/17/23 2111 98.4 F (36.9 C)     Temp Source 06/17/23 2111 Oral     SpO2 06/17/23 2111 100 %     Weight 06/17/23 2110 260 lb (117.9 kg)     Height 06/17/23 2110 5\' 6"  (1.676 m)     Head Circumference --      Peak Flow --      Pain Score 06/17/23 2110 0     Pain Loc --      Pain Education --      Exclude from Growth Chart --     Most recent vital signs: Vitals:   06/17/23 2133 06/17/23 2200  BP: 93/72 (!) 86/65  Pulse: 81 (!) 123  Resp: (!) 28 16  Temp:    SpO2: 93% 93%    Constitutional: Alert and oriented. Eyes: Conjunctivae are normal. Head: Atraumatic. Nose: No congestion/rhinnorhea. Mouth/Throat: Mucous membranes are moist.  Cardiovascular: Tachycardic, regular rhythm. Grossly normal heart sounds.  2+ radial pulses  bilaterally. Respiratory: Normal respiratory effort.  No retractions. Lungs CTAB. Gastrointestinal: Soft and nontender. No distention. Musculoskeletal: No lower extremity tenderness nor edema.  Neurologic:  Normal speech and language. No gross focal neurologic deficits are appreciated.    ED Results / Procedures / Treatments   Labs (all labs ordered are listed, but only abnormal results are displayed) Labs Reviewed  COMPREHENSIVE METABOLIC PANEL - Abnormal; Notable for the following components:      Result Value   Glucose, Bld 203 (*)    BUN 34 (*)    Creatinine, Ser 2.15 (*)    AST 183 (*)    ALT 113 (*)    GFR, Estimated 33 (*)    All other components within normal limits  BRAIN NATRIURETIC PEPTIDE - Abnormal; Notable for the following components:   B Natriuretic Peptide 224.7 (*)    All other components within normal limits  PROTIME-INR - Abnormal; Notable for the following components:   Prothrombin Time 24.9 (*)    INR 2.2 (*)    All other components within normal limits  TROPONIN I (HIGH SENSITIVITY) - Abnormal; Notable for the following components:   Troponin I (High Sensitivity) 41 (*)    All  other components within normal limits  CBC WITH DIFFERENTIAL/PLATELET  MAGNESIUM     EKG  ED ECG REPORT I, Chesley Noon, the attending physician, personally viewed and interpreted this ECG.   Date: 06/17/2023  EKG Time: 21:09  Rate: 191  Rhythm: Ventricular tachycardia  Axis: Normal  Intervals:nonspecific intraventricular conduction delay  ST&T Change: N/A  ED ECG REPORT I, Chesley Noon, the attending physician, personally viewed and interpreted this ECG.   Date: 06/17/2023  EKG Time: 21:28  Rate: 102  Rhythm: atrial fibrillation  Axis: Normal  Intervals:nonspecific intraventricular conduction delay  ST&T Change: None   RADIOLOGY Chest x-ray reviewed and interpreted by me with no infiltrate, edema, or effusion.  PROCEDURES:  Critical Care performed:  Yes, see critical care procedure note(s)  .Critical Care  Performed by: Chesley Noon, MD Authorized by: Chesley Noon, MD   Critical care provider statement:    Critical care time (minutes):  30   Critical care time was exclusive of:  Separately billable procedures and treating other patients and teaching time   Critical care was necessary to treat or prevent imminent or life-threatening deterioration of the following conditions:  Cardiac failure   Critical care was time spent personally by me on the following activities:  Development of treatment plan with patient or surrogate, discussions with consultants, evaluation of patient's response to treatment, examination of patient, ordering and review of laboratory studies, ordering and review of radiographic studies, ordering and performing treatments and interventions, pulse oximetry, re-evaluation of patient's condition and review of old charts   I assumed direction of critical care for this patient from another provider in my specialty: no     Care discussed with: admitting provider      MEDICATIONS ORDERED IN ED: Medications  amiodarone (NEXTERONE) 1.8 mg/mL load via infusion 150 mg (150 mg Intravenous Bolus from Bag 06/17/23 2121)    Followed by  amiodarone (NEXTERONE PREMIX) 360-4.14 MG/200ML-% (1.8 mg/mL) IV infusion (60 mg/hr Intravenous New Bag/Given 06/17/23 2120)    Followed by  amiodarone (NEXTERONE PREMIX) 360-4.14 MG/200ML-% (1.8 mg/mL) IV infusion (has no administration in time range)  ondansetron (ZOFRAN) 4 MG/2ML injection (has no administration in time range)  magnesium sulfate IVPB 2 g 50 mL (2 g Intravenous New Bag/Given 06/17/23 2126)  fentaNYL (SUBLIMAZE) injection 50 mcg (50 mcg Intravenous Given 06/17/23 2125)  midazolam (VERSED) injection 1 mg (1 mg Intravenous Given 06/17/23 2126)     IMPRESSION / MDM / ASSESSMENT AND PLAN / ED COURSE  I reviewed the triage vital signs and the nursing notes.                               67 y.o. male with past medical history of hyperlipidemia, nonischemic cardiomyopathy, CHF, mechanical aortic valve, and thoracic aortic aneurysm who presents to the ED complaining of palpitations for the past hour.  Patient's presentation is most consistent with acute presentation with potential threat to life or bodily function.  Differential diagnosis includes, but is not limited to, arrhythmia, ACS, CHF exacerbation, pneumonia, pneumothorax, anemia, electrolyte abnormality, AKI.  Patient ill-appearing on arrival, noted to be tachycardic at 190 and EKG concerning for ventricular tachycardia.  Patient with increasing hypotension during my evaluation and decision was made to proceed with synchronized cardioversion.  Patient was given 2 g of IV magnesium and bolus dose of amiodarone was initiated while preparing for cardioversion.  Just prior to cardioversion, around the same time patient was  given fentanyl and Versed for sedation, he converted to atrial fibrillation.  Blood pressure now seems to be improving, patient now minimally symptomatic and continues to deny chest pain.  EKG shows atrial fibrillation with no ischemic changes.  Case discussed with Dr. Azucena Cecil of cardiology, who recommends continuing amiodarone drip with admission to the hospital.  Labs show AKI with transaminitis, suspect this is due to shock from his arrhythmia.  Blood pressure remains improved following conversion to atrial fibrillation, no acute electrolyte abnormality noted but we will give supplemental potassium to keep his potassium above 4.  Troponin mildly elevated, also suspect this is due to arrhythmia and we will trend.  Chest x-ray is unremarkable, no significant anemia or leukocytosis noted.  Case discussed with hospitalist for admission.      FINAL CLINICAL IMPRESSION(S) / ED DIAGNOSES   Final diagnoses:  Ventricular tachycardia (HCC)     Rx / DC Orders   ED Discharge Orders     None         Note:  This document was prepared using Dragon voice recognition software and may include unintentional dictation errors.   Chesley Noon, MD 06/17/23 2234

## 2023-06-17 NOTE — Assessment & Plan Note (Addendum)
-   We will continue Wellbutrin XL. 

## 2023-06-17 NOTE — Assessment & Plan Note (Signed)
Hold Coumadin and continue on heparin drip.

## 2023-06-17 NOTE — ED Triage Notes (Signed)
Pt reports palpitations and wife reports some slurred speech that began tonight around 2015. Pt speech is at baseline at this time. Pt denies  weakness or numbness in any extremity, pt reports feeling light headed.

## 2023-06-17 NOTE — Assessment & Plan Note (Signed)
-   We will continue Farxiga, lisinopril, Lasix and Toprol-XL.

## 2023-06-17 NOTE — Assessment & Plan Note (Signed)
-   The patient will be placed on supplemental coverage with NovoLog. - We will hold off metformin. 

## 2023-06-17 NOTE — Assessment & Plan Note (Signed)
-   We will continue allopurinol 

## 2023-06-17 NOTE — Assessment & Plan Note (Signed)
-   This has spontaneously converted to atrial fibrillation as mentioned above. - He could have been related to intraventricular duction delay. - We will continue IV amiodarone.

## 2023-06-18 DIAGNOSIS — I472 Ventricular tachycardia, unspecified: Secondary | ICD-10-CM

## 2023-06-18 DIAGNOSIS — N189 Chronic kidney disease, unspecified: Secondary | ICD-10-CM

## 2023-06-18 DIAGNOSIS — I959 Hypotension, unspecified: Secondary | ICD-10-CM

## 2023-06-18 DIAGNOSIS — I9589 Other hypotension: Secondary | ICD-10-CM

## 2023-06-18 DIAGNOSIS — I5022 Chronic systolic (congestive) heart failure: Secondary | ICD-10-CM

## 2023-06-18 DIAGNOSIS — F3289 Other specified depressive episodes: Secondary | ICD-10-CM

## 2023-06-18 DIAGNOSIS — I4891 Unspecified atrial fibrillation: Secondary | ICD-10-CM | POA: Diagnosis not present

## 2023-06-18 DIAGNOSIS — N182 Chronic kidney disease, stage 2 (mild): Secondary | ICD-10-CM

## 2023-06-18 DIAGNOSIS — N179 Acute kidney failure, unspecified: Secondary | ICD-10-CM | POA: Diagnosis not present

## 2023-06-18 LAB — CBG MONITORING, ED
Glucose-Capillary: 113 mg/dL — ABNORMAL HIGH (ref 70–99)
Glucose-Capillary: 87 mg/dL (ref 70–99)
Glucose-Capillary: 86 mg/dL (ref 70–99)

## 2023-06-18 LAB — HEMOGLOBIN A1C
Hgb A1c MFr Bld: 6.3 % — ABNORMAL HIGH (ref 4.8–5.6)
Mean Plasma Glucose: 134.11 mg/dL

## 2023-06-18 LAB — CBC
HCT: 39.3 % (ref 39.0–52.0)
Hemoglobin: 12.7 g/dL — ABNORMAL LOW (ref 13.0–17.0)
MCH: 27.9 pg (ref 26.0–34.0)
MCHC: 32.3 g/dL (ref 30.0–36.0)
MCV: 86.2 fL (ref 80.0–100.0)
Platelets: 191 10*3/uL (ref 150–400)
RBC: 4.56 MIL/uL (ref 4.22–5.81)
RDW: 15.5 % (ref 11.5–15.5)
WBC: 7.9 10*3/uL (ref 4.0–10.5)
nRBC: 0 % (ref 0.0–0.2)

## 2023-06-18 LAB — TSH: TSH: 1.817 u[IU]/mL (ref 0.350–4.500)

## 2023-06-18 LAB — HEPARIN LEVEL (UNFRACTIONATED): Heparin Unfractionated: 0.14 [IU]/mL — ABNORMAL LOW (ref 0.30–0.70)

## 2023-06-18 LAB — BASIC METABOLIC PANEL
Anion gap: 8 (ref 5–15)
BUN: 35 mg/dL — ABNORMAL HIGH (ref 8–23)
CO2: 26 mmol/L (ref 22–32)
Calcium: 8.5 mg/dL — ABNORMAL LOW (ref 8.9–10.3)
Chloride: 105 mmol/L (ref 98–111)
Creatinine, Ser: 1.95 mg/dL — ABNORMAL HIGH (ref 0.61–1.24)
GFR, Estimated: 37 mL/min — ABNORMAL LOW (ref >60)
Glucose, Bld: 106 mg/dL — ABNORMAL HIGH (ref 70–99)
Potassium: 3.8 mmol/L (ref 3.5–5.1)
Sodium: 139 mmol/L (ref 135–145)

## 2023-06-18 LAB — PROTIME-INR
INR: 2.3 — ABNORMAL HIGH (ref 0.8–1.2)
Prothrombin Time: 25.9 s — ABNORMAL HIGH (ref 11.4–15.2)

## 2023-06-18 LAB — MAGNESIUM: Magnesium: 2.8 mg/dL — ABNORMAL HIGH (ref 1.7–2.4)

## 2023-06-18 LAB — HIV ANTIBODY (ROUTINE TESTING W REFLEX): HIV Screen 4th Generation wRfx: NONREACTIVE

## 2023-06-18 LAB — TROPONIN I (HIGH SENSITIVITY): Troponin I (High Sensitivity): 509 ng/L (ref <18)

## 2023-06-18 MED ORDER — SODIUM CHLORIDE 0.9 % IV BOLUS
500.0000 mL | Freq: Once | INTRAVENOUS | Status: AC
  Administered 2023-06-18: 500 mL via INTRAVENOUS

## 2023-06-18 MED ORDER — HEPARIN (PORCINE) 25000 UT/250ML-% IV SOLN
1850.0000 [IU]/h | INTRAVENOUS | Status: DC
  Administered 2023-06-18: 1250 [IU]/h via INTRAVENOUS
  Administered 2023-06-19: 1550 [IU]/h via INTRAVENOUS
  Filled 2023-06-18 (×4): qty 250

## 2023-06-18 MED ORDER — MIDODRINE HCL 5 MG PO TABS: 5.0000 mg | ORAL_TABLET | Freq: Three times a day (TID) | ORAL | Status: DC

## 2023-06-18 MED ORDER — METOPROLOL SUCCINATE ER 25 MG PO TB24
25.0000 mg | ORAL_TABLET | Freq: Every day | ORAL | Status: DC
  Administered 2023-06-20: 25 mg via ORAL
  Filled 2023-06-18 (×2): qty 1

## 2023-06-18 MED ORDER — HEPARIN BOLUS VIA INFUSION
2700.0000 [IU] | Freq: Once | INTRAVENOUS | Status: AC
  Administered 2023-06-18: 2700 [IU] via INTRAVENOUS
  Filled 2023-06-18: qty 2700

## 2023-06-18 NOTE — Assessment & Plan Note (Addendum)
Patient received fluids yesterday.  Holding Lasix and lisinopril.  Holding parameters on Toprol.  As per Dr. Okey Dupre, patient usually has low blood pressure.  Yesterday with standing up and was asymptomatic.

## 2023-06-18 NOTE — Assessment & Plan Note (Addendum)
Acute kidney injury on CKD stage II.  Creatinine 2.15 on presentation and down to 1.53 today.  Received IV fluids today.  Hold Lasix and lisinopril.  Holding parameters on metoprolol.

## 2023-06-18 NOTE — Hospital Course (Addendum)
67 year old man past medical history of chronic systolic congestive heart failure, cardiomyopathy, chronic kidney disease, type 2 diabetes mellitus, bicuspid aortic valve mechanical replacement on Coumadin, hyperlipidemia, CVA, thoracic aortic aneurysm, TIA.  Patient was found to be in sustained ventricular tachycardia and when they are about to cardiovert him he converted over to atrial fibrillation.  Started on IV amiodarone.  06/18/2023.  Patient feeling okay offers no complaints.  Patient was given morning medications and then became hypotensive.  Fluid bolus ordered and IV fluids.  Adjustments and medications made.  Cardiology plans on doing cardiac cath once INR is better. 12/17.  Patient feels fine and offers no complaints.  Cardiology waiting INR to come down prior to cardiac catheterization.

## 2023-06-18 NOTE — ED Notes (Signed)
This RN spoke with floor coverage MD about pt still being on amio drip. MD said to continue drip based off of cardiology's notes in Pt's chart. Will reach out to floor coverage if pt's vital signs change and a change in care plan is needed.

## 2023-06-18 NOTE — Progress Notes (Signed)
Progress Note   Patient: Roberto [name] QIO:[id number] DOB: [birth year] DOA: 06/17/2023     1 DOS: the patient was seen and examined on 06/18/2023   Brief hospital course: 67 year old man past medical history of chronic systolic congestive heart failure, cardiomyopathy, chronic kidney disease, type 2 diabetes mellitus, bicuspid aortic valve mechanical replacement on Coumadin, hyperlipidemia, CVA, thoracic aortic aneurysm, TIA.  Patient was found to be in sustained ventricular tachycardia and when they are about to cardiovert him he converted over to atrial fibrillation.  Started on IV amiodarone.  06/18/2023.  Patient feeling okay offers no complaints.  Patient was given morning medications and then became hypotensive.  Fluid bolus ordered and IV fluids.  Adjustments and medications made.  Cardiology plans on doing cardiac cath once INR is better.  Assessment and Plan: * Sustained ventricular tachycardia (HCC) Converted to atrial fibrillation prior to cardioversion.  Cardiology to do a cardiac catheterization once INR is lower.  Atrial fibrillation with RVR (HCC) New onset.  Heart rate better on amiodarone drip.  Hypotension Adjustments in medications made.  Fluid bolus and IV fluids today.  Decrease dose of Toprol.  Holding Lasix and lisinopril.  Acute kidney injury superimposed on chronic kidney disease (HCC) Acute kidney injury on CKD stage II.  Creatinine 2.15 on presentation down to 1.95.  Continue IV fluids.  Hold Lasix.  Holding parameters on metoprolol.  Discontinue lisinopril.  H/O mechanical aortic valve replacement Hold Coumadin and started on heparin drip.  Type 2 diabetes mellitus with chronic kidney disease, without long-term current use of insulin (HCC) Continue sliding scale insulin.  Hemoglobin A1c 6.3.  Hold metformin.  Depression - We will continue Wellbutrin XL  Chronic systolic CHF (congestive heart failure) (HCC) Last echo showing EF 35%.  Watch closely with  IV fluids.  Hold Lasix, will hold lisinopril, holding parameters on Toprol at lower dose.  On Farxiga.  Gout Continue allopurinol        Subjective: Patient seen this morning and was feeling okay.  Patient seen again this afternoon and was feeling okay even though he did have hypotension.  Will continue fluids and a fluid bolus.  Made adjustments in medications.  Cardiology planning on a cardiac catheterization since he came in with sustained ventricular tachycardia and converted over to atrial fibrillation.  Physical Exam: Vitals:   06/18/23 1130 06/18/23 1210 06/18/23 1400 06/18/23 1500  BP: (!) 74/57 (!) 81/70 97/76 (!) 83/66  Pulse: (!) 59 62 71 78  Resp: 15 18  18   Temp:      TempSrc:      SpO2: 92% 95% 95% 99%  Weight:      Height:       Physical Exam HENT:     Head: Normocephalic.     Mouth/Throat:     Pharynx: No oropharyngeal exudate.  Eyes:     General: Lids are normal.     Conjunctiva/sclera: Conjunctivae normal.  Cardiovascular:     Rate and Rhythm: Normal rate. Rhythm irregularly irregular.     Heart sounds: Normal heart sounds, S1 normal and S2 normal.  Pulmonary:     Breath sounds: No decreased breath sounds, wheezing, rhonchi or rales.  Abdominal:     Palpations: Abdomen is soft.     Tenderness: There is no abdominal tenderness.  Musculoskeletal:     Right lower leg: No swelling.     Left lower leg: No swelling.  Skin:    General: Skin is warm.     Findings: No rash.  Neurological:     Mental Status: He is alert and oriented to person, place, and time.     Data Reviewed: Creatinine 1.95, magnesium 2.8, troponin 509, hemoglobin 12.7, and INR 2.3, hemoglobin A1c 6.3  Family Communication: Family at bedside in the afternoon  Disposition: Status is: Inpatient Remains inpatient appropriate because: Cardiology planning on cardiac catheterization once INR is down  Planned Discharge Destination: Home    Time spent: 35 minutes Case discussed with  cardiology  Author: Alford Highland, MD 06/18/2023 3:37 PM  For on call review [email or web address].

## 2023-06-18 NOTE — Assessment & Plan Note (Addendum)
Last echo showing EF 35%.  No signs of heart failure this morning.  Holding  Lasix, and lisinopril with acute kidney injury.  Holding parameters on Toprol at lower dose.  On Farxiga.

## 2023-06-18 NOTE — TOC Initial Note (Signed)
Transition of Care [facility]) - Initial/Assessment Note    Patient Details  Name: Roberto [name] MRN: [id number] Date of Birth: [birth year]  [facility]) CM/SW Contact:    Marquita Palms, LCSW Phone Number: 06/18/2023, 10:37 AM  Clinical Narrative:                  CSW met with patient bedside and patient live alone. Patient reports that he uses [employer] Patient reports he has an appointment scheduled with his PCP. Patient reports he has no DME at home and does not need any. Patient reports he would be agreeable to [facility] is assigned by MD. No other needs at this time.  Expected Discharge Plan: Home w Home Health Services Barriers to Discharge: Continued Medical Work up   Patient Goals and CMS Choice Patient states their goals for this hospitalization and ongoing recovery are:: to go back home and making sure he is good.   Choice offered to / list presented to : NA      Expected Discharge Plan and Services In-house Referral: Clinical Social Work Discharge Planning Services: NA Post Acute Care Choice: Home Health (if needed by MD) Living arrangements for the past 2 months: Single Family Home                   DME Agency: NA       HH Arranged: NA          Prior Living Arrangements/Services Living arrangements for the past 2 months: Single Family Home Lives with:: Self Patient language and need for interpreter reviewed:: No Do you feel safe going back to the place where you live?: Yes      Need for Family Participation in Patient Care: Yes (Comment) Care giver support system in place?: Yes (comment) Current home services: Other (comment) Criminal Activity/Legal Involvement Pertinent to Current Situation/Hospitalization: No - Comment as needed  Activities of Daily Living      Permission Sought/Granted                  Emotional Assessment Appearance:: Disheveled Attitude/Demeanor/Rapport: Gracious Affect (typically observed):  Hopeful Orientation: : Oriented to Self, Oriented to Place, Oriented to  Time, Oriented to Situation      Admission diagnosis:  Atrial fibrillation with RVR (HCC) [I48.91] Patient Active Problem List   Diagnosis Date Noted   Acute kidney injury superimposed on chronic kidney disease (HCC) 06/18/2023   Atrial fibrillation with RVR (HCC) 06/17/2023   NSVT (nonsustained ventricular tachycardia) (HCC) 06/17/2023   Chronic diastolic heart failure (HCC) 06/17/2023   Depression 06/17/2023   Gout 06/17/2023   Type 2 diabetes mellitus with chronic kidney disease, without long-term current use of insulin (HCC) 06/17/2023   Tremor of left hand 05/29/2022   Aneurysm of ascending aorta without rupture (HCC) 02/17/2022   Anticoagulated on Coumadin 07/29/2021   Chronic kidney disease, stage 3a (HCC) 12/09/2020   Hyperlipidemia associated with type 2 diabetes mellitus (HCC) 12/09/2020   Coronary artery disease involving native coronary artery of native heart without angina pectoris 09/10/2019   Osteoarthritis of spine with radiculopathy, lumbar region 11/21/2018   History of aortic stenosis 06/05/2018   Chronic gout without tophus 04/03/2018   Valvular heart disease 01/17/2018   Morbid obesity (HCC) 01/17/2018   Chronic HFrEF (heart failure with reduced ejection fraction) (HCC) 09/27/2016   Renal infarct (HCC) 05/01/2016   Type 2 diabetes mellitus with cardiac complication (HCC) 04/19/2016   Chronic bilateral low back pain  without sciatica 04/19/2016   Thoracic aortic aneurysm without rupture (HCC) 03/16/2016   Aortic atherosclerosis (HCC) 03/16/2016   Nicotine dependence, cigarettes, uncomplicated 03/14/2016   NICM (nonischemic cardiomyopathy) (HCC) 03/02/2016   COPD (chronic obstructive pulmonary disease) (HCC) 09/09/2015   H/O mechanical aortic valve replacement 02/09/2015   Hypertension associated with diabetes (HCC) 02/09/2015   PCP:  [name], DO Pharmacy:   [facility]  [id number]), [place] - [address] [place] ([address]) [place] [address] Phone: [phone] Fax: [phone]  [facility] [name] [place] [address] Phone: [phone] Fax: [phone]  [id number], [place] - [address] AT [facility] [address] [facility] [place] [address] Phone: [phone] Fax: [phone]     Social Drivers of Health (SDOH) Social History: SDOH Screenings   Food Insecurity: Food Insecurity Present (01/01/2023)   [facility]  Housing: Low Risk  (08/21/2022)  Transportation Needs: Unmet Transportation Needs (01/01/2023)   [facility]  Utilities: Not At Risk (08/21/2022)  Alcohol Screen: Low Risk  (08/21/2022)  Depression (PHQ2-9): High Risk (05/16/2023)  Financial Resource Strain: Low Risk  (08/21/2022)  Physical Activity: Inactive (08/21/2022)  Social Connections: Socially Isolated (08/21/2022)  Stress: No Stress Concern Present (08/21/2022)  Tobacco Use: Medium Risk (06/17/2023)   SDOH Interventions:     Readmission Risk Interventions    06/18/2023   10:33 AM  Readmission Risk Prevention Plan  Transportation Screening Complete  PCP or Specialist Appt within 3-5 Days Complete  HRI or Home Care Consult Complete  Social Work Consult for Recovery Care Planning/Counseling Patient refused  Palliative Care Screening Not Applicable  Medication Review Ocean[employer]) Complete

## 2023-06-18 NOTE — ED Notes (Signed)
MD made aware of BP at this time

## 2023-06-18 NOTE — Consult Note (Signed)
Cardiology Consultation   Patient ID: Roberto [name] MRN: [id number]; DOB: [birth year]  Admit date: 06/17/2023 Date of Consult: 06/18/2023  PCP:  [name], DO   [facility] Providers Cardiologist:  Yvonne Kendall, MD  Sleep Medicine:  Armanda Magic, MD  {   Patient Profile:   Roberto [name] is a 67 y.o. male with a hx of mechanical aortic valve replacement, nonobstructive CAD, thoracic aortic aneurysm (followed by [facility]), stroke, right renal infarct, chronic systolic heart failure (LVEF 40-45%), HLD, OSA not using CPAP and morbid obesity who is being seen 06/18/2023 for the evaluation of palpitations at the request of Dr. Renae Gloss.  History of Present Illness:   Roberto [name] is followed by Dr. Okey Dupre for the above cardiac issues.  He underwent mechanical valve replacement on 03/25/2010, he is on long-term Coumadin.  Echo in 2020 showed EF of 40 to 45%, impaired relaxation, moderately dilated left atrium, mild stenosis of the aortic valve, mean gradient 15 mmHg, dilation of the ascending aorta at 4.6 cm.  Had a right and left heart cath in 202 that showed moderate single vessel CAD with 60% stenosis involving large first diagonal branch. Otherwise, there is mild disease involving the mLAD and pRCA.  Echo in October 2023 showed EF 35 to 40%, grade 2 diastolic dysfunction.  Patient was last seen in October 2020 for and was overall stable from a cardiac perspective.  Patient was working on weight loss.  The patient presented to the ER on 06/17/2023 with palpitations. He reported they started the night before presentation. He felt dizzy, had SOB and felt sweaty. Symptoms were constant. She was brought by EMS with concern for ventricular tachycardia.    In the ER blood pressure was 172/100, pulse rate 191 bpm, respiratory rate 20, afebrile, 100% O2.  Labs showed serum creatinine 2.14, BUN 34, blood glucose 203, AST 193, ALT 113, BNP 224, INR 2.2.  High-sensitivity troponin 41>509.  Initial EKG shows VT 191 bpm vs SVT with aberrancy. He was hypotensive during evaluation and plan was to undergo synchronized cardioversion, but he converted to Afib prior to this.  He was given 2 g of IV magnesium and a bolus of amiodarone.  Patient is in Afib with controlled rates.  Past Medical History:  Diagnosis Date   Bicuspid aortic valve    a. s/p #27 Carbomedics mechanical valve on 03/25/2010; b. on Coumadin; c. TTE 12/17: EF 40-45%, moderately dilated LV with moderate LVH, AVR well-seated with 14 mmHg gradient, peak AV velocity 2.5 m/s, mild mitral valve thickening with mild MR, mildly dilated RV with mildly reduced contraction   Cellulitis    Chronic kidney disease    Chronic systolic CHF (congestive heart failure) (HCC)    a. R/LHC 03/2010 showed no significant CAD, LVEDP 31 mmHg, mean AoV gradient 34 mmHg at rest and 47 mmHg with dobutamine 20 mcg/kg/min, AVA 1.0 cm^2, RA 31, RV 68/25, PA 68/47, PCWP 38. PA sat 65%. CO 6.2 L/min (Fick) and 5.3 L/min (thermodilution)   Clotting disorder (HCC)    H/O mechanical aortic valve replacement 03/25/2010   a. #27 Carbomedics mechanical valve   Hypercholesterolemia    Renal infarct [facility]) 2017   Multiple right renal infarcts, likely embolic.   Stroke [facility])    TIA (transient ischemic attack) 05/2014    Past Surgical History:  Procedure Laterality Date   AORTIC VALVE REPLACEMENT     CARDIAC CATHETERIZATION  03/21/2010   No significant CAD. Severe aortic stenosis. Severely elevated left  and right heart filling pressures.   CARDIAC SURGERY  2009   CHF   CARPAL TUNNEL RELEASE Left 2005   RIGHT HEART CATH AND CORONARY ANGIOGRAPHY N/A 01/28/2019   Procedure: RIGHT HEART CATH AND CORONARY ANGIOGRAPHY;  Surgeon: Yvonne Kendall, MD;  Location: [facility] INVASIVE CV LAB;  Service: Cardiovascular;  Laterality: N/A;   TONSILLECTOMY  1962     Home Medications:  Prior to Admission medications   Medication Sig Start Date End Date Taking? Authorizing  Provider  albuterol (PROVENTIL) (2.5 MG/3ML) 0.083% nebulizer solution USE 1 VIAL IN NEBULIZER EVERY 6 HOURS AS NEEDED FOR WHEEZING FOR SHORTNESS OF BREATH 06/07/22   [name], [name], DO  allopurinol (ZYLOPRIM) 300 MG tablet Take 1 tablet (300 mg total) by mouth daily. 05/16/23   Olevia Perches P, DO  aspirin 81 MG chewable tablet Chew 81 mg by mouth daily.    [provider]  buPROPion (WELLBUTRIN SR) 150 MG 12 hr tablet Take 1 pill in the AM for 3 days, then increase to 1 pill BID. Pick a day in the 2nd week to quit 04/10/23   Olevia Perches P, DO  cyclobenzaprine (FLEXERIL) 10 MG tablet Take 1 tablet (10 mg total) by mouth 3 times/day as needed-between meals & bedtime for muscle spasms. 12/10/18   [name], [name], DO  dapagliflozin propanediol (FARXIGA) 10 MG TABS tablet Take 1 tablet (10 mg total) by mouth daily. 04/06/23   End, Cristal Deer, MD  fenofibrate (TRICOR) 48 MG tablet Take 1 tablet (48 mg total) by mouth daily. 12/25/22   End, Cristal Deer, MD  furosemide (LASIX) 80 MG tablet Take 1 tablet (80 mg total) by mouth daily. 04/06/23   End, Cristal Deer, MD  lisinopril (ZESTRIL) 2.5 MG tablet Take 1 tablet (2.5 mg total) by mouth daily. 04/06/23   End, Cristal Deer, MD  metFORMIN (GLUCOPHAGE-XR) 500 MG 24 hr tablet Take 1 tablet (500 mg total) by mouth daily with breakfast. 05/16/23   Olevia Perches P, DO  metoprolol succinate (TOPROL-XL) 100 MG 24 hr tablet Take 1 tablet (100 mg total) by mouth daily. 04/06/23   End, Cristal Deer, MD  nicotine (NICODERM CQ) 7 mg/24hr patch Place 1 patch (7 mg total) onto the skin daily. 04/10/23   [name], [name], DO  nortriptyline (PAMELOR) 10 MG capsule Take 1 capsule (10 mg total) by mouth at bedtime. 05/16/23   [name], [name], DO  Omega-3 Fatty Acids (FISH OIL) 1000 MG CAPS Take 5,000 mg by mouth daily.    [provider]  potassium chloride SA (KLOR-CON M) 20 MEQ tablet Take 1 tablet (20 mEq total) by mouth daily. 04/06/23   End, Cristal Deer, MD   rosuvastatin (CRESTOR) 40 MG tablet Take 1 tablet (40 mg total) by mouth daily. 04/06/23   End, Cristal Deer, MD  Semaglutide, 1 MG/DOSE, 4 MG/3ML SOPN Inject 1 mg as directed once a week. 05/16/23   [name], [name], DO  TRELEGY ELLIPTA 100-62.5-25 MCG/ACT AEPB Inhale 1 puff by mouth once daily 05/16/23   [name], [name], DO  VENTOLIN HFA 108 (90 Base) MCG/ACT inhaler INHALE 2 PUFFS BY MOUTH EVERY 6 HOURS AS NEEDED FOR WHEEZING OR SHORTNESS OF BREATH 05/16/23   [name], [name], DO  warfarin (COUMADIN) 4 MG tablet TAKE 1 TABLET BY MOUTH ONCE DAILY AT  6PM  AS  DIRECTED 05/16/23   Olevia Perches P, DO  warfarin (COUMADIN) 7.5 MG tablet TAKE 4MG  BY MOUTH FOR 2 DAYS, THEN TAKE 7.5MG  ON THE THIRD DAY, THEN REPEAT 05/16/23  [name], DO    Inpatient Medications: Scheduled Meds:  allopurinol  300 mg Oral Daily   aspirin  81 mg Oral Daily   buPROPion  150 mg Oral BID   dapagliflozin propanediol  10 mg Oral Daily   fenofibrate  54 mg Oral Daily   furosemide  80 mg Oral Daily   insulin aspart  0-5 Units Subcutaneous QHS   insulin aspart  0-9 Units Subcutaneous TID WC   lisinopril  2.5 mg Oral Daily   metoprolol succinate  100 mg Oral Daily   omega-3 acid ethyl esters  1 g Oral Daily   potassium chloride SA  20 mEq Oral Daily   rosuvastatin  40 mg Oral Daily   warfarin  4 mg Oral UD   Continuous Infusions:  sodium chloride 125 mL/hr at 06/18/23 0740   amiodarone 30 mg/hr (06/18/23 0736)   PRN Meds: acetaminophen **OR** acetaminophen, albuterol, cyclobenzaprine, magnesium hydroxide, ondansetron **OR** ondansetron (ZOFRAN) IV, traZODone  Allergies:   No Known Allergies  Social History:   Social History   Socioeconomic History   Marital status: Single    Spouse name: Not on file   Number of children: 1   Years of education: 4   Highest education level: Associate degree: academic program  Occupational History   Occupation: Disabled    Employer: UNEMPLOYED  Tobacco Use    Smoking status: Former    Current packs/day: 0.00    Average packs/day: 0.5 packs/day for 46.0 years (23.0 ttl pk-yrs)    Types: Cigarettes    Start date: 07/16/2017    Quit date: 05/11/2019    Years since quitting: 4.1   Smokeless tobacco: Never  Vaping Use   Vaping status: Never Used  Substance and Sexual Activity   Alcohol use: No    Alcohol/week: 0.0 standard drinks of alcohol   Drug use: No   Sexual activity: Not Currently  Other Topics Concern   Not on file  Social History Narrative   Not on file   Social Drivers of Health   Financial Resource Strain: Low Risk  (08/21/2022)   Overall Financial Resource Strain (CARDIA)    Difficulty of Paying Living Expenses: Not very hard  Food Insecurity: Food Insecurity Present (01/01/2023)   [facility]   Hunger Vital Sign    Worried About Running Out of Food in the Last Year: Sometimes true    Ran Out of Food in the Last Year: Sometimes true  Transportation Needs: Unmet Transportation Needs (01/01/2023)   [facility]   PRAPARE - Transportation    Lack of Transportation (Medical): Yes    Lack of Transportation (Non-Medical): Yes  Physical Activity: Inactive (08/21/2022)   Exercise Vital Sign    Days of Exercise per Week: 0 days    Minutes of Exercise per Session: 0 min  Stress: No Stress Concern Present (08/21/2022)   [employer] of Occupational Health - Occupational Stress Questionnaire    Feeling of Stress : Only a little  Social Connections: Socially Isolated (08/21/2022)   Social Connection and Isolation Panel [NHANES]    Frequency of Communication with Friends and Family: More than three times a week    Frequency of Social Gatherings with Friends and Family: Never    Attends Religious Services: Never    Database administrator or Organizations: No    Attends Banker Meetings: Never    Marital Status: Divorced  Catering manager  Violence:  Not At Risk (08/21/2022)   Humiliation, Afraid, Rape, and Kick questionnaire    Fear of Current or Ex-Partner: No    Emotionally Abused: No    Physically Abused: No    Sexually Abused: No    Family History:    Family History  Problem Relation Age of Onset   Arthritis Mother    Dementia Mother    Colon cancer Mother    Arthritis Father    Diabetes Father    Stroke Father    Colon cancer Father    Heart attack Brother    Breast cancer Sister    Seizures Sister    Cancer Brother        brain   Heart disease Brother    Heart attack Brother      ROS:  Please see the history of present illness.   All other ROS reviewed and negative.     Physical Exam/Data:   Vitals:   06/18/23 0035 06/18/23 0300 06/18/23 0435 06/18/23 0615  BP: (!) 88/72 90/70  100/63  Pulse: 73 64  65  Resp: 17 16  15   Temp:   98.3 F (36.8 C)   TempSrc:   Oral   SpO2: 97% 95%  100%  Weight:      Height:        Intake/Output Summary (Last 24 hours) at 06/18/2023 0748 Last data filed at 06/18/2023 0446 Gross per 24 hour  Intake 374.62 ml  Output --  Net 374.62 ml      06/17/2023    9:10 PM 05/16/2023    8:37 AM 04/10/2023    1:15 PM  Last 3 Weights  Weight (lbs) 260 lb 264 lb 12.8 oz 264 lb 3.2 oz  Weight (kg) 117.935 kg 120.112 kg 119.84 kg     Body mass index is 41.97 kg/m.  General:  Well nourished, well developed, in no acute distress HEENT: normal Neck: no JVD Vascular: No carotid bruits; Distal pulses 2+ bilaterally Cardiac:  normal S1, S2; Irreg Irreg; no murmur  Lungs:  diminished breath sounds Abd: soft, nontender, no hepatomegaly  Ext: no edema Musculoskeletal:  No deformities, BUE and BLE strength normal and equal Skin: warm and dry  Neuro:  CNs 2-12 intact, no focal abnormalities noted Psych:  Normal affect   EKG:  The EKG was personally reviewed and demonstrates:  VT vs SVT with aberrancy>> Afib Telemetry:  Telemetry was personally reviewed and  demonstrates:  VT>>afib HR 60s  Relevant CV Studies:  Echo 04/2022 1. Left ventricular ejection fraction, by estimation, is 35 to 40%. The  left ventricle has moderately decreased function. The left ventricle  demonstrates regional wall motion abnormalities (challenging images but  appears to have anterior and  anteroseptal hypokinsis, apical region not well visualized). Left  ventricular diastolic parameters are consistent with Grade II diastolic  dysfunction (pseudonormalization).   2. Right ventricular systolic function is mildly reduced (not well  visualized). The right ventricular size is normal. Tricuspid regurgitation  signal is inadequate for assessing PA pressure.   3. The mitral valve is normal in structure. No evidence of mitral valve  regurgitation. No evidence of mitral stenosis.   4. The aortic valve was not well visualized. Aortic valve regurgitation  is not visualized. No aortic stenosis is present. There is a 27 mm  Carbometrics mechanical valve present in the aortic position.   5. The inferior vena cava is normal in size with greater than 50%  respiratory variability, suggesting right atrial pressure  of 3 mmHg.   6. Left atrial size was mildly dilated.   R/L heart cath 2020 Conclusions: Moderate single-vessel coronary artery disease with 60% stenosis involving large first diagonal branch.  Otherwise, there is mild disease (up to 20%) involving the mid LAD and proximal RCA. Moderately elevated left heart and pulmonary artery pressures. Severely elevated right heart filling pressure. Mildly reduced Fick cardiac output/index.   Recommendations: Increase furosemide to 40 mg BID, with BMP in the office in ~1 week.  Further escalation of furosemide and evidence-based heart failure therapy will need to be considered at follow-up. Medical therapy and risk factor modification to prevent progression of moderate CAD. Ongoing workup/management of thoracic aortic aneurysm per  Dr. Vickey Sages. If no evidence of bleeding or vascular injury at left radial and antecubital catheterization sites, warfarin with enoxaparin bridge can be restarted tonight.  Ongoing management of anticoagulation per Dr. Laural Benes. Follow-up with me or APP in the office in ~2 weeks.   Yvonne Kendall, MD [facility] Pager: [phone]    Laboratory Data:  High Sensitivity Troponin:   Recent Labs  Lab 06/17/23 2110 06/18/23 0635  TROPONINIHS 41* 509*     Chemistry Recent Labs  Lab 06/17/23 2110 06/18/23 0635  NA 137 139  K 3.8 3.8  CL 103 105  CO2 26 26  GLUCOSE 203* 106*  BUN 34* 35*  CREATININE 2.15* 1.95*  CALCIUM 9.2 8.5*  MG 2.2  --   GFRNONAA 33* 37*  ANIONGAP 8 8    Recent Labs  Lab 06/17/23 2110  PROT 7.2  ALBUMIN 4.0  AST 183*  ALT 113*  ALKPHOS 52  BILITOT 0.8   Lipids No results for input(s): "CHOL", "TRIG", "HDL", "LABVLDL", "LDLCALC", "CHOLHDL" in the last 168 hours.  Hematology Recent Labs  Lab 06/17/23 2110 06/18/23 0635  WBC 9.6 7.9  RBC 5.32 4.56  HGB 14.9 12.7*  HCT 45.8 39.3  MCV 86.1 86.2  MCH 28.0 27.9  MCHC 32.5 32.3  RDW 15.5 15.5  PLT 255 191   Thyroid No results for input(s): "TSH", "FREET4" in the last 168 hours.  BNP Recent Labs  Lab 06/17/23 2110  BNP 224.7*    DDimer No results for input(s): "DDIMER" in the last 168 hours.   Radiology/Studies:  DG Chest Portable 1 View Result Date: 06/17/2023 CLINICAL DATA:  Shortness of breath EXAM: PORTABLE CHEST 1 VIEW COMPARISON:  Chest x-ray 11/04/2018 FINDINGS: The heart is enlarged. The lungs are clear. There is no pleural effusion or pneumothorax. No acute fractures are seen. Sternotomy wires are present. IMPRESSION: Cardiomegaly. No acute cardiopulmonary process. Electronically Signed   By: Darliss Cheney M.D.   On: 06/17/2023 21:56     Assessment and Plan:   Tachyarrhythmia, Wide QRS regular rhythm - VT vs SVT with aberrancy, suspect monomorphic VT - started on IV  amiodarone with conversion to new Afib - h/o IVCD - prior cath with moderate CAD (report above) - continue IV amiodarone - continue Toprol 25mg  daily - h/o low EF, 35-40% on most recent echo - repeat echo, may need heart cath  New afib - converted to afib with IV amio as above -  in afib with rates in the 60s - on warfarin due to valvular disease - check TSH and Mag - CHADSVASC at least 5 (age, CHF, CVAx2, PAD)  Chronic HFrEF H/o NICM - echo in 2023 showed LVEF 35-40%, G2DD - echo in 2020 showed LVEF 40-45% - appears euvolemic - re-check echo  Elevated  troponin Nonobstructive disease - HS trop elevated to 500s - likely demand ischemia, but cannot rule out worsening CAD - I will start IV heparin - may need heart cath as above - continue statin, Toprol, ASA 81mg  daily  AKI on CKD - IVF - Scr 2.15>1.95  H/o mechanical aortic valve replacement - PA coumadin, start IV heparin in case of procedure  For questions or updates, please contact [facility] Please consult [email or web address] for contact info under    Signed, Louisa Favaro David Stall, [name]  06/18/2023 7:48 AM

## 2023-06-18 NOTE — Progress Notes (Addendum)
PHARMACY - ANTICOAGULATION CONSULT NOTE  Pharmacy Consult for heparin Indication: chest pain/ACS  No Known Allergies  Patient Measurements: Height: 5\' 6"  (167.6 cm) Weight: 117.9 kg (260 lb) IBW/kg (Calculated) : 63.8 Heparin Dosing Weight: 91.2 kg  Vital Signs: Temp: 97.8 F (36.6 C) (12/16 0939) Temp Source: Oral (12/16 0939) BP: 107/81 (12/16 0937) Pulse Rate: 71 (12/16 0937)  Labs: Recent Labs    06/17/23 2110 06/18/23 0635 06/18/23 0936  HGB 14.9 12.7*  --   HCT 45.8 39.3  --   PLT 255 191  --   LABPROT 24.9*  --  25.9*  INR 2.2*  --  2.3*  CREATININE 2.15* 1.95*  --   TROPONINIHS 41* 509*  --    Estimated Creatinine Clearance: 43.8 mL/min (A) (by C-G formula based on SCr of 1.95 mg/dL (H)).  Medical History: Past Medical History:  Diagnosis Date   Bicuspid aortic valve    a. s/p #27 [employer]omed[employer] mechanical valve on 03/25/2010; b. on Coumadin; c. TTE 12/17: EF 40-45%, moderately dilated LV with moderate LVH, AVR well-seated with 14 mmHg gradient, peak AV velocity 2.5 m/s, mild mitral valve thickening with mild MR, mildly dilated RV with mildly reduced contraction   Cellulitis    Chronic kidney disease    Chronic systolic CHF (congestive heart failure) (HCC)    a. R/LHC 03/2010 showed no significant CAD, LVEDP 31 mmHg, mean AoV gradient 34 mmHg at rest and 47 mmHg with dobutamine 20 mcg/kg/min, AVA 1.0 cm^2, RA 31, RV 68/25, PA 68/47, PCWP 38. PA sat 65%. CO 6.2 L/min (Fick) and 5.3 L/min (thermodilution)   Clotting disorder (HCC)    H/O mechanical aortic valve replacement 03/25/2010   a. #27 Carbomedics mechanical valve   Hypercholesterolemia    Renal infarct [facility]) 2017   Multiple right renal infarcts, likely embolic.   Stroke [facility])    TIA (transient ischemic attack) 05/2014   Medications:  Scheduled:   allopurinol  300 mg Oral Daily   aspirin  81 mg Oral Daily   buPROPion  150 mg Oral BID   dapagliflozin propanediol  10 mg Oral Daily   fenofibrate  54 mg  Oral Daily   furosemide  80 mg Oral Daily   insulin aspart  0-5 Units Subcutaneous QHS   insulin aspart  0-9 Units Subcutaneous TID WC   lisinopril  2.5 mg Oral Daily   metoprolol succinate  100 mg Oral Daily   omega-3 acid ethyl esters  1 g Oral Daily   potassium chloride SA  20 mEq Oral Daily   rosuvastatin  40 mg Oral Daily   Assessment: 67 y/o male presenting with palpitations. PMH significant for atrial fibrillation and mechanical valve replacement in 03/2010 (on warfarin PTA). Pharmacy has been consulted to initiate heparin infusion given that patient may need heart catheterization.   Per anticoagulation clinic notes:  Home warfarin regimen is 7.5 mg on Tuesdays and Fridays, 4 mg all other days INR goal is 2.5-3.5  Per patient: last warfarin dose in evening of 12/15; pt cannot remember what dose of warfarin he took  Goal of Therapy:  Heparin level 0.3-0.7 units/ml Monitor platelets by anticoagulation protocol: Yes   INR is 2.3 today, which is below goal. Given subtherapeutic INR, will initiate heparin infusion. Will not give heparin bolus given recent warfarin use.  Labs:  1216 1642 HL = 0.14; subtherapeutic on 1250 units/hr   Plan:  Give bolus of 2700 units x 1  Start heparin infusion at 1550 units/hour  without bolus Recheck Anti-Xa level 6 hours after rate adjustment  Monitor daily Anti-Xa levels while on heparin infusion Monitor CBC and signs/symptoms of bleeding  Thank you for involving pharmacy in this patient's care.   Littie Deeds, PharmD Pharmacy Resident  06/18/2023 5:30 PM

## 2023-06-18 NOTE — Assessment & Plan Note (Addendum)
Converted to atrial fibrillation prior to cardioversion.  Cardiology to do a cardiac catheterization once INR is lower.  Cardiology to speak with EP specialist on whether defibrillator is needed.

## 2023-06-19 DIAGNOSIS — N179 Acute kidney failure, unspecified: Secondary | ICD-10-CM | POA: Diagnosis not present

## 2023-06-19 DIAGNOSIS — I4891 Unspecified atrial fibrillation: Secondary | ICD-10-CM | POA: Diagnosis not present

## 2023-06-19 DIAGNOSIS — I9589 Other hypotension: Secondary | ICD-10-CM | POA: Diagnosis not present

## 2023-06-19 DIAGNOSIS — I472 Ventricular tachycardia, unspecified: Secondary | ICD-10-CM | POA: Diagnosis not present

## 2023-06-19 LAB — CBG MONITORING, ED
Glucose-Capillary: 90 mg/dL (ref 70–99)
Glucose-Capillary: 154 mg/dL — ABNORMAL HIGH (ref 70–99)
Glucose-Capillary: 113 mg/dL — ABNORMAL HIGH (ref 70–99)

## 2023-06-19 LAB — BASIC METABOLIC PANEL
Anion gap: 8 (ref 5–15)
BUN: 29 mg/dL — ABNORMAL HIGH (ref 8–23)
CO2: 21 mmol/L — ABNORMAL LOW (ref 22–32)
Calcium: 8.2 mg/dL — ABNORMAL LOW (ref 8.9–10.3)
Chloride: 109 mmol/L (ref 98–111)
Creatinine, Ser: 1.53 mg/dL — ABNORMAL HIGH (ref 0.61–1.24)
GFR, Estimated: 49 mL/min — ABNORMAL LOW (ref >60)
Glucose, Bld: 109 mg/dL — ABNORMAL HIGH (ref 70–99)
Potassium: 3.6 mmol/L (ref 3.5–5.1)
Sodium: 138 mmol/L (ref 135–145)

## 2023-06-19 LAB — CBC
HCT: 39.3 % (ref 39.0–52.0)
Hemoglobin: 12.9 g/dL — ABNORMAL LOW (ref 13.0–17.0)
MCH: 27.9 pg (ref 26.0–34.0)
MCHC: 32.8 g/dL (ref 30.0–36.0)
MCV: 85.1 fL (ref 80.0–100.0)
Platelets: 200 10*3/uL (ref 150–400)
RBC: 4.62 MIL/uL (ref 4.22–5.81)
RDW: 15.9 % — ABNORMAL HIGH (ref 11.5–15.5)
WBC: 8.6 10*3/uL (ref 4.0–10.5)
nRBC: 0 % (ref 0.0–0.2)

## 2023-06-19 LAB — HEPARIN LEVEL (UNFRACTIONATED)
Heparin Unfractionated: 0.44 [IU]/mL (ref 0.30–0.70)
Heparin Unfractionated: 0.41 [IU]/mL (ref 0.30–0.70)

## 2023-06-19 LAB — GLUCOSE, CAPILLARY: Glucose-Capillary: 89 mg/dL (ref 70–99)

## 2023-06-19 LAB — PROTIME-INR
INR: 2.5 — ABNORMAL HIGH (ref 0.8–1.2)
Prothrombin Time: 27.1 s — ABNORMAL HIGH (ref 11.4–15.2)

## 2023-06-19 MED ORDER — SODIUM CHLORIDE 0.9 % WEIGHT BASED INFUSION
3.0000 mL/kg/h | INTRAVENOUS | Status: DC
  Administered 2023-06-20: 3 mL/kg/h via INTRAVENOUS

## 2023-06-19 MED ORDER — SODIUM CHLORIDE 0.9 % WEIGHT BASED INFUSION
1.0000 mL/kg/h | INTRAVENOUS | Status: DC
  Administered 2023-06-20: 1 mL/kg/h via INTRAVENOUS

## 2023-06-19 MED ORDER — ASPIRIN 81 MG PO CHEW
81.0000 mg | CHEWABLE_TABLET | ORAL | Status: AC
  Administered 2023-06-20: 81 mg via ORAL
  Filled 2023-06-19: qty 1

## 2023-06-19 NOTE — Progress Notes (Signed)
PHARMACY - ANTICOAGULATION CONSULT NOTE  Pharmacy Consult for heparin Indication: chest pain/ACS  No Known Allergies  Patient Measurements: Height: 5\' 6"  (167.6 cm) Weight: 117.9 kg (260 lb) IBW/kg (Calculated) : 63.8 Heparin Dosing Weight: 91.2 kg  Vital Signs: Temp: 97.9 F (36.6 C) (12/17 0753) Temp Source: Oral (12/17 0753) BP: 88/59 (12/17 1000) Pulse Rate: 69 (12/17 1000)  Labs: Recent Labs    06/17/23 2110 06/18/23 0635 06/18/23 0936 06/18/23 1642 06/19/23 0322 06/19/23 0951  HGB 14.9 12.7*  --   --  12.9*  --   HCT 45.8 39.3  --   --  39.3  --   PLT 255 191  --   --  200  --   LABPROT 24.9*  --  25.9*  --  27.1*  --   INR 2.2*  --  2.3*  --  2.5*  --   HEPARINUNFRC  --   --   --  0.14* 0.44 0.41  CREATININE 2.15* 1.95*  --   --  1.53*  --   TROPONINIHS 41* 509*  --   --   --   --    Estimated Creatinine Clearance: 55.8 mL/min (A) (by C-G formula based on SCr of 1.53 mg/dL (H)).  Medical History: Past Medical History:  Diagnosis Date   Bicuspid aortic valve    a. s/p #27 Carbomed[employer] mechanical valve on 03/25/2010; b. on Coumadin; c. TTE 12/17: EF 40-45%, moderately dilated LV with moderate LVH, AVR well-seated with 14 mmHg gradient, peak AV velocity 2.5 m/s, mild mitral valve thickening with mild MR, mildly dilated RV with mildly reduced contraction   Cellulitis    Chronic kidney disease    Chronic systolic CHF (congestive heart failure) (HCC)    a. R/LHC 03/2010 showed no significant CAD, LVEDP 31 mmHg, mean AoV gradient 34 mmHg at rest and 47 mmHg with dobutamine 20 mcg/kg/min, AVA 1.0 cm^2, RA 31, RV 68/25, PA 68/47, PCWP 38. PA sat 65%. CO 6.2 L/min (Fick) and 5.3 L/min (thermodilution)   Clotting disorder (HCC)    H/O mechanical aortic valve replacement 03/25/2010   a. #27 Carbomedics mechanical valve   Hypercholesterolemia    Renal infarct [facility]) 2017   Multiple right renal infarcts, likely embolic.   Stroke [facility])    TIA (transient ischemic attack)  05/2014   Medications:  Scheduled:   allopurinol  300 mg Oral Daily   aspirin  81 mg Oral Daily   buPROPion  150 mg Oral BID   dapagliflozin propanediol  10 mg Oral Daily   fenofibrate  54 mg Oral Daily   insulin aspart  0-5 Units Subcutaneous QHS   insulin aspart  0-9 Units Subcutaneous TID WC   metoprolol succinate  25 mg Oral Daily   omega-3 acid ethyl esters  1 g Oral Daily   potassium chloride SA  20 mEq Oral Daily   rosuvastatin  40 mg Oral Daily   Assessment: 67 y/o male presenting with palpitations. PMH significant for atrial fibrillation and mechanical valve replacement in 03/2010 (on warfarin PTA). Pharmacy has been consulted to initiate heparin infusion given that patient may need heart catheterization.   Per anticoagulation clinic notes:  Home warfarin regimen is 7.5 mg on Tuesdays and Fridays, 4 mg all other days INR goal is 2.5-3.5  Per patient: last warfarin dose in evening of 12/15; pt cannot remember what dose of warfarin he took  Goal of Therapy:  Heparin level 0.3-0.7 units/ml Monitor platelets by anticoagulation protocol: Yes  INR is 2.3 today, which is below goal. Given subtherapeutic INR, will initiate heparin infusion. Will not give heparin bolus given recent warfarin use.  Labs:  1216 1642 HL = 0.14; subtherapeutic on 1250 units/hr  1217 0322 HL = 0.44; therapeutic X 1  12/17 0951 HL = 0.41; therapeutic x 2   Plan:  HL therapeutic x 2  Continue heparin infusion at current rate of 1550 units/hr  Will check next anti-Xa level with AM labs  Monitor CBC and signs/symptoms of bleeding  Thank you for involving pharmacy in this patient's care.   Littie Deeds, PharmD Pharmacy Resident  06/19/2023 10:36 AM

## 2023-06-19 NOTE — Progress Notes (Signed)
Rounding Note    Patient Name: Roberto [name] Date of Encounter: 06/19/2023  [facility] Cardiologist: Yvonne Kendall, MD   Subjective   In afib with slow rates on Iv amiodarone. No further VT noted. Patient denies chest pain. Breathing is OK. INR 2.5.  Inpatient Medications    Scheduled Meds:  allopurinol  300 mg Oral Daily   aspirin  81 mg Oral Daily   buPROPion  150 mg Oral BID   dapagliflozin propanediol  10 mg Oral Daily   fenofibrate  54 mg Oral Daily   insulin aspart  0-5 Units Subcutaneous QHS   insulin aspart  0-9 Units Subcutaneous TID WC   metoprolol succinate  25 mg Oral Daily   omega-3 acid ethyl esters  1 g Oral Daily   potassium chloride SA  20 mEq Oral Daily   rosuvastatin  40 mg Oral Daily   Continuous Infusions:  amiodarone 30 mg/hr (06/19/23 0710)   heparin 1,550 Units/hr (06/19/23 0408)   PRN Meds: acetaminophen **OR** acetaminophen, albuterol, cyclobenzaprine, magnesium hydroxide, ondansetron **OR** ondansetron (ZOFRAN) IV, traZODone   Vital Signs    Vitals:   06/19/23 0630 06/19/23 0700 06/19/23 0753 06/19/23 0808  BP: 115/84 (!) 113/94  95/63  Pulse: (!) 52 74  99  Resp: 17 20  (!) 21  Temp:   97.9 F (36.6 C)   TempSrc:   Oral   SpO2: 97% 95%  98%  Weight:      Height:        Intake/Output Summary (Last 24 hours) at 06/19/2023 1610 Last data filed at 06/19/2023 0408 Gross per 24 hour  Intake 2705.68 ml  Output --  Net 2705.68 ml      06/17/2023    9:10 PM 05/16/2023    8:37 AM 04/10/2023    1:15 PM  Last 3 Weights  Weight (lbs) 260 lb 264 lb 12.8 oz 264 lb 3.2 oz  Weight (kg) 117.935 kg 120.112 kg 119.84 kg      Telemetry    Afib HR 50s, PVCs - Personally Reviewed  ECG    No new - Personally Reviewed  Physical Exam   GEN: No acute distress.   Neck: No JVD Cardiac: Irreg Irreg, no murmurs, rubs, or gallops.  Respiratory: Clear to auscultation bilaterally. GI: Soft, nontender, non-distended  MS: No  edema; No deformity. Neuro:  Nonfocal  Psych: Normal affect   Labs    High Sensitivity Troponin:   Recent Labs  Lab 06/17/23 2110 06/18/23 0635  TROPONINIHS 41* 509*     Chemistry Recent Labs  Lab 06/17/23 2110 06/18/23 0635 06/18/23 0936 06/19/23 0322  NA 137 139  --  138  K 3.8 3.8  --  3.6  CL 103 105  --  109  CO2 26 26  --  21*  GLUCOSE 203* 106*  --  109*  BUN 34* 35*  --  29*  CREATININE 2.15* 1.95*  --  1.53*  CALCIUM 9.2 8.5*  --  8.2*  MG 2.2  --  2.8*  --   PROT 7.2  --   --   --   ALBUMIN 4.0  --   --   --   AST 183*  --   --   --   ALT 113*  --   --   --   ALKPHOS 52  --   --   --   BILITOT 0.8  --   --   --   [facility]  33* 37*  --  49*  ANIONGAP 8 8  --  8    Lipids No results for input(s): "CHOL", "TRIG", "HDL", "LABVLDL", "LDLCALC", "CHOLHDL" in the last 168 hours.  Hematology Recent Labs  Lab 06/17/23 2110 06/18/23 0635 06/19/23 0322  WBC 9.6 7.9 8.6  RBC 5.32 4.56 4.62  HGB 14.9 12.7* 12.9*  HCT 45.8 39.3 39.3  MCV 86.1 86.2 85.1  MCH 28.0 27.9 27.9  MCHC 32.5 32.3 32.8  RDW 15.5 15.5 15.9*  PLT 255 191 200   Thyroid  Recent Labs  Lab 06/18/23 0936  TSH 1.817    BNP Recent Labs  Lab 06/17/23 2110  BNP 224.7*    DDimer No results for input(s): "DDIMER" in the last 168 hours.   Radiology    DG Chest Portable 1 View Result Date: 06/17/2023 CLINICAL DATA:  Shortness of breath EXAM: PORTABLE CHEST 1 VIEW COMPARISON:  Chest x-ray 11/04/2018 FINDINGS: The heart is enlarged. The lungs are clear. There is no pleural effusion or pneumothorax. No acute fractures are seen. Sternotomy wires are present. IMPRESSION: Cardiomegaly. No acute cardiopulmonary process. Electronically Signed   By: Darliss Cheney M.D.   On: 06/17/2023 21:56    Cardiac Studies   Echo 04/2022 1. Left ventricular ejection fraction, by estimation, is 35 to 40%. The  left ventricle has moderately decreased function. The left ventricle  demonstrates regional wall  motion abnormalities (challenging images but  appears to have anterior and  anteroseptal hypokinsis, apical region not well visualized). Left  ventricular diastolic parameters are consistent with Grade II diastolic  dysfunction (pseudonormalization).   2. Right ventricular systolic function is mildly reduced (not well  visualized). The right ventricular size is normal. Tricuspid regurgitation  signal is inadequate for assessing PA pressure.   3. The mitral valve is normal in structure. No evidence of mitral valve  regurgitation. No evidence of mitral stenosis.   4. The aortic valve was not well visualized. Aortic valve regurgitation  is not visualized. No aortic stenosis is present. There is a 27 mm  Carbometrics mechanical valve present in the aortic position.   5. The inferior vena cava is normal in size with greater than 50%  respiratory variability, suggesting right atrial pressure of 3 mmHg.   6. Left atrial size was mildly dilated.    R/L heart cath 2020 Conclusions: Moderate single-vessel coronary artery disease with 60% stenosis involving large first diagonal branch.  Otherwise, there is mild disease (up to 20%) involving the mid LAD and proximal RCA. Moderately elevated left heart and pulmonary artery pressures. Severely elevated right heart filling pressure. Mildly reduced Fick cardiac output/index.   Recommendations: Increase furosemide to 40 mg BID, with BMP in the office in ~1 week.  Further escalation of furosemide and evidence-based heart failure therapy will need to be considered at follow-up. Medical therapy and risk factor modification to prevent progression of moderate CAD. Ongoing workup/management of thoracic aortic aneurysm per Dr. Vickey Sages. If no evidence of bleeding or vascular injury at left radial and antecubital catheterization sites, warfarin with enoxaparin bridge can be restarted tonight.  Ongoing management of anticoagulation per Dr. Laural Benes. Follow-up with  me or APP in the office in ~2 weeks.   Yvonne Kendall, MD [facility] Pager: [phone]    Patient Profile     67 y.o. male with a hx of mechanical aortic valve replacement, nonobstructive CAD, thoracic aortic aneurysm (followed by [facility]), stroke, right renal infarct, chronic systolic heart failure (LVEF 40-45%), HLD, OSA  not using CPAP and morbid obesity who is being seen 06/18/2023 for the evaluation of palpitations   Assessment & Plan    Tachyarrhythmia, Wide QRS regular rhythm - patient presented with VT, started on IV amiodarone with conversion to new Afib - prior cath in 2020 with moderate CAD (report above) - continue IV amiodarone - continue Toprol 25mg  daily - h/o low EF, 35-40% on most recent echo. Re-check echo - plan for R/L heart cath when INR is around 1.8. currently on IV heparin. INR 2.2> 2.5   New afib - converted to afib with IV amio as above -  in afib with rates in the 50s - on warfarin due to h/o mechanical aortic valve replacement - THS wnl, Mag 2.8 - CHADSVASC at least 5 (age, CHF, CVAx2, PAD) - may need cardioversion if patient does not medically convert   Chronic HFrEF H/o NICM - echo in 2020 showed LVEF 40-45% - echo in 2023 showed LVEF 35-40%, G2DD - appears euvolemic - re-check echo   Elevated troponin Nonobstructive disease - HS trop elevated to 500s - no chest pain reported - likely demand ischemia, but cannot rule out worsening CAD - I will start IV heparin - plan for heart cath as above - continue statin, Toprol, ASA 81mg  daily  H/o mechanical aortic valve - goal INR 2.5-3 - warfarin held for IV heparin - follow INR   AKI on CKD - IVF - Scr 2.15>1.95>1.53   For questions or updates, please contact [facility] Please consult [email or web address] for contact info under        Signed, Ahmarion Saraceno David Stall, [name]  06/19/2023, 8:22 AM

## 2023-06-19 NOTE — ED Notes (Signed)
Informed RN Dahlia Client via chat/ pt has bed assigned

## 2023-06-19 NOTE — H&P (View-Only) (Signed)
Rounding Note    Patient Name: Roberto [name] Date of Encounter: 06/19/2023  [facility] Cardiologist: Yvonne Kendall, MD   Subjective   In afib with slow rates on Iv amiodarone. No further VT noted. Patient denies chest pain. Breathing is OK. INR 2.5.  Inpatient Medications    Scheduled Meds:  allopurinol  300 mg Oral Daily   aspirin  81 mg Oral Daily   buPROPion  150 mg Oral BID   dapagliflozin propanediol  10 mg Oral Daily   fenofibrate  54 mg Oral Daily   insulin aspart  0-5 Units Subcutaneous QHS   insulin aspart  0-9 Units Subcutaneous TID WC   metoprolol succinate  25 mg Oral Daily   omega-3 acid ethyl esters  1 g Oral Daily   potassium chloride SA  20 mEq Oral Daily   rosuvastatin  40 mg Oral Daily   Continuous Infusions:  amiodarone 30 mg/hr (06/19/23 0710)   heparin 1,550 Units/hr (06/19/23 0408)   PRN Meds: acetaminophen **OR** acetaminophen, albuterol, cyclobenzaprine, magnesium hydroxide, ondansetron **OR** ondansetron (ZOFRAN) IV, traZODone   Vital Signs    Vitals:   06/19/23 0630 06/19/23 0700 06/19/23 0753 06/19/23 0808  BP: 115/84 (!) 113/94  95/63  Pulse: (!) 52 74  99  Resp: 17 20  (!) 21  Temp:   97.9 F (36.6 C)   TempSrc:   Oral   SpO2: 97% 95%  98%  Weight:      Height:        Intake/Output Summary (Last 24 hours) at 06/19/2023 1610 Last data filed at 06/19/2023 0408 Gross per 24 hour  Intake 2705.68 ml  Output --  Net 2705.68 ml      06/17/2023    9:10 PM 05/16/2023    8:37 AM 04/10/2023    1:15 PM  Last 3 Weights  Weight (lbs) 260 lb 264 lb 12.8 oz 264 lb 3.2 oz  Weight (kg) 117.935 kg 120.112 kg 119.84 kg      Telemetry    Afib HR 50s, PVCs - Personally Reviewed  ECG    No new - Personally Reviewed  Physical Exam   GEN: No acute distress.   Neck: No JVD Cardiac: Irreg Irreg, no murmurs, rubs, or gallops.  Respiratory: Clear to auscultation bilaterally. GI: Soft, nontender, non-distended  MS: No  edema; No deformity. Neuro:  Nonfocal  Psych: Normal affect   Labs    High Sensitivity Troponin:   Recent Labs  Lab 06/17/23 2110 06/18/23 0635  TROPONINIHS 41* 509*     Chemistry Recent Labs  Lab 06/17/23 2110 06/18/23 0635 06/18/23 0936 06/19/23 0322  NA 137 139  --  138  K 3.8 3.8  --  3.6  CL 103 105  --  109  CO2 26 26  --  21*  GLUCOSE 203* 106*  --  109*  BUN 34* 35*  --  29*  CREATININE 2.15* 1.95*  --  1.53*  CALCIUM 9.2 8.5*  --  8.2*  MG 2.2  --  2.8*  --   PROT 7.2  --   --   --   ALBUMIN 4.0  --   --   --   AST 183*  --   --   --   ALT 113*  --   --   --   ALKPHOS 52  --   --   --   BILITOT 0.8  --   --   --   [facility]  33* 37*  --  49*  ANIONGAP 8 8  --  8    Lipids No results for input(s): "CHOL", "TRIG", "HDL", "LABVLDL", "LDLCALC", "CHOLHDL" in the last 168 hours.  Hematology Recent Labs  Lab 06/17/23 2110 06/18/23 0635 06/19/23 0322  WBC 9.6 7.9 8.6  RBC 5.32 4.56 4.62  HGB 14.9 12.7* 12.9*  HCT 45.8 39.3 39.3  MCV 86.1 86.2 85.1  MCH 28.0 27.9 27.9  MCHC 32.5 32.3 32.8  RDW 15.5 15.5 15.9*  PLT 255 191 200   Thyroid  Recent Labs  Lab 06/18/23 0936  TSH 1.817    BNP Recent Labs  Lab 06/17/23 2110  BNP 224.7*    DDimer No results for input(s): "DDIMER" in the last 168 hours.   Radiology    DG Chest Portable 1 View Result Date: 06/17/2023 CLINICAL DATA:  Shortness of breath EXAM: PORTABLE CHEST 1 VIEW COMPARISON:  Chest x-ray 11/04/2018 FINDINGS: The heart is enlarged. The lungs are clear. There is no pleural effusion or pneumothorax. No acute fractures are seen. Sternotomy wires are present. IMPRESSION: Cardiomegaly. No acute cardiopulmonary process. Electronically Signed   By: Darliss Cheney M.D.   On: 06/17/2023 21:56    Cardiac Studies   Echo 04/2022 1. Left ventricular ejection fraction, by estimation, is 35 to 40%. The  left ventricle has moderately decreased function. The left ventricle  demonstrates regional wall  motion abnormalities (challenging images but  appears to have anterior and  anteroseptal hypokinsis, apical region not well visualized). Left  ventricular diastolic parameters are consistent with Grade II diastolic  dysfunction (pseudonormalization).   2. Right ventricular systolic function is mildly reduced (not well  visualized). The right ventricular size is normal. Tricuspid regurgitation  signal is inadequate for assessing PA pressure.   3. The mitral valve is normal in structure. No evidence of mitral valve  regurgitation. No evidence of mitral stenosis.   4. The aortic valve was not well visualized. Aortic valve regurgitation  is not visualized. No aortic stenosis is present. There is a 27 mm  Carbometrics mechanical valve present in the aortic position.   5. The inferior vena cava is normal in size with greater than 50%  respiratory variability, suggesting right atrial pressure of 3 mmHg.   6. Left atrial size was mildly dilated.    R/L heart cath 2020 Conclusions: Moderate single-vessel coronary artery disease with 60% stenosis involving large first diagonal branch.  Otherwise, there is mild disease (up to 20%) involving the mid LAD and proximal RCA. Moderately elevated left heart and pulmonary artery pressures. Severely elevated right heart filling pressure. Mildly reduced Fick cardiac output/index.   Recommendations: Increase furosemide to 40 mg BID, with BMP in the office in ~1 week.  Further escalation of furosemide and evidence-based heart failure therapy will need to be considered at follow-up. Medical therapy and risk factor modification to prevent progression of moderate CAD. Ongoing workup/management of thoracic aortic aneurysm per Dr. Vickey Sages. If no evidence of bleeding or vascular injury at left radial and antecubital catheterization sites, warfarin with enoxaparin bridge can be restarted tonight.  Ongoing management of anticoagulation per Dr. Laural Benes. Follow-up with  me or APP in the office in ~2 weeks.   Yvonne Kendall, MD [facility] Pager: [phone]    Patient Profile     67 y.o. male with a hx of mechanical aortic valve replacement, nonobstructive CAD, thoracic aortic aneurysm (followed by [facility]), stroke, right renal infarct, chronic systolic heart failure (LVEF 40-45%), HLD, OSA  not using CPAP and morbid obesity who is being seen 06/18/2023 for the evaluation of palpitations   Assessment & Plan    Tachyarrhythmia, Wide QRS regular rhythm - patient presented with VT, started on IV amiodarone with conversion to new Afib - prior cath in 2020 with moderate CAD (report above) - continue IV amiodarone - continue Toprol 25mg  daily - h/o low EF, 35-40% on most recent echo. Re-check echo - plan for R/L heart cath when INR is around 1.8. currently on IV heparin. INR 2.2> 2.5   New afib - converted to afib with IV amio as above -  in afib with rates in the 50s - on warfarin due to h/o mechanical aortic valve replacement - THS wnl, Mag 2.8 - CHADSVASC at least 5 (age, CHF, CVAx2, PAD) - may need cardioversion if patient does not medically convert   Chronic HFrEF H/o NICM - echo in 2020 showed LVEF 40-45% - echo in 2023 showed LVEF 35-40%, G2DD - appears euvolemic - re-check echo   Elevated troponin Nonobstructive disease - HS trop elevated to 500s - no chest pain reported - likely demand ischemia, but cannot rule out worsening CAD - I will start IV heparin - plan for heart cath as above - continue statin, Toprol, ASA 81mg  daily  H/o mechanical aortic valve - goal INR 2.5-3 - warfarin held for IV heparin - follow INR   AKI on CKD - IVF - Scr 2.15>1.95>1.53   For questions or updates, please contact [facility] Please consult [email or web address] for contact info under        Signed, Ahmarion Saraceno David Stall, [name]  06/19/2023, 8:22 AM

## 2023-06-19 NOTE — Progress Notes (Signed)
PHARMACY - ANTICOAGULATION CONSULT NOTE  Pharmacy Consult for heparin Indication: chest pain/ACS  No Known Allergies  Patient Measurements: Height: 5\' 6"  (167.6 cm) Weight: 117.9 kg (260 lb) IBW/kg (Calculated) : 63.8 Heparin Dosing Weight: 91.2 kg  Vital Signs: Temp: 97.7 F (36.5 C) (12/16 2037) Temp Source: Oral (12/16 2037) BP: 86/59 (12/17 0300) Pulse Rate: 63 (12/17 0300)  Labs: Recent Labs    06/17/23 2110 06/18/23 0635 06/18/23 0936 06/18/23 1642 06/19/23 0322  HGB 14.9 12.7*  --   --  12.9*  HCT 45.8 39.3  --   --  39.3  PLT 255 191  --   --  200  LABPROT 24.9*  --  25.9*  --  27.1*  INR 2.2*  --  2.3*  --  2.5*  HEPARINUNFRC  --   --   --  0.14* 0.44  CREATININE 2.15* 1.95*  --   --  1.53*  TROPONINIHS 41* 509*  --   --   --    Estimated Creatinine Clearance: 55.8 mL/min (A) (by C-G formula based on SCr of 1.53 mg/dL (H)).  Medical History: Past Medical History:  Diagnosis Date   Bicuspid aortic valve    a. s/p #27 [employer]omed[employer] mechanical valve on 03/25/2010; b. on Coumadin; c. TTE 12/17: EF 40-45%, moderately dilated LV with moderate LVH, AVR well-seated with 14 mmHg gradient, peak AV velocity 2.5 m/s, mild mitral valve thickening with mild MR, mildly dilated RV with mildly reduced contraction   Cellulitis    Chronic kidney disease    Chronic systolic CHF (congestive heart failure) (HCC)    a. R/LHC 03/2010 showed no significant CAD, LVEDP 31 mmHg, mean AoV gradient 34 mmHg at rest and 47 mmHg with dobutamine 20 mcg/kg/min, AVA 1.0 cm^2, RA 31, RV 68/25, PA 68/47, PCWP 38. PA sat 65%. CO 6.2 L/min (Fick) and 5.3 L/min (thermodilution)   Clotting disorder (HCC)    H/O mechanical aortic valve replacement 03/25/2010   a. #27 [employer]omed[employer] mechanical valve   Hypercholesterolemia    Renal infarct [facility]) 2017   Multiple right renal infarcts, likely embolic.   Stroke [facility])    TIA (transient ischemic attack) 05/2014   Medications:  Scheduled:   allopurinol  300  mg Oral Daily   aspirin  81 mg Oral Daily   buPROPion  150 mg Oral BID   dapagliflozin propanediol  10 mg Oral Daily   fenofibrate  54 mg Oral Daily   insulin aspart  0-5 Units Subcutaneous QHS   insulin aspart  0-9 Units Subcutaneous TID WC   metoprolol succinate  25 mg Oral Daily   omega-3 acid ethyl esters  1 g Oral Daily   potassium chloride SA  20 mEq Oral Daily   rosuvastatin  40 mg Oral Daily   Assessment: 67 y/o male presenting with palpitations. PMH significant for atrial fibrillation and mechanical valve replacement in 03/2010 (on warfarin PTA). Pharmacy has been consulted to initiate heparin infusion given that patient may need heart catheterization.   Per anticoagulation clinic notes:  Home warfarin regimen is 7.5 mg on Tuesdays and Fridays, 4 mg all other days INR goal is 2.5-3.5  Per patient: last warfarin dose in evening of 12/15; pt cannot remember what dose of warfarin he took  Goal of Therapy:  Heparin level 0.3-0.7 units/ml Monitor platelets by anticoagulation protocol: Yes   INR is 2.3 today, which is below goal. Given subtherapeutic INR, will initiate heparin infusion. Will not give heparin bolus given recent warfarin  use.  Labs:  1216 1642 HL = 0.14; subtherapeutic on 1250 units/hr  1217 0322 HL = 0.44, therapeutic X 1   Plan:  12/17:  HL @ 0322 = 0.44, therapeutic X 1 - Will continue pt on current rate and recheck HL on 12/17 @ 0900.  Monitor daily Anti-Xa levels while on heparin infusion Monitor CBC and signs/symptoms of bleeding  Thank you for involving pharmacy in this patient's care.   Emanuelle Bastos D, PharmD 06/19/2023 3:53 AM

## 2023-06-19 NOTE — Progress Notes (Signed)
Progress Note   Patient: Roberto [name] UJW:[id number] DOB: 1956-01-04 DOA: 06/17/2023     2 DOS: the patient was seen and examined on 06/19/2023   Brief hospital course: 67 year old man past medical history of chronic systolic congestive heart failure, cardiomyopathy, chronic kidney disease, type 2 diabetes mellitus, bicuspid aortic valve mechanical replacement on Coumadin, hyperlipidemia, CVA, thoracic aortic aneurysm, TIA.  Patient was found to be in sustained ventricular tachycardia and when they are about to cardiovert him he converted over to atrial fibrillation.  Started on IV amiodarone.  06/18/2023.  Patient feeling okay offers no complaints.  Patient was given morning medications and then became hypotensive.  Fluid bolus ordered and IV fluids.  Adjustments and medications made.  Cardiology plans on doing cardiac cath once INR is better. 12/17.  Patient feels fine and offers no complaints.  Cardiology waiting INR to come down prior to cardiac catheterization.  Assessment and Plan: * Sustained ventricular tachycardia (HCC) Converted to atrial fibrillation prior to cardioversion.  Cardiology to do a cardiac catheterization once INR is lower.  Cardiology to speak with EP specialist on whether defibrillator is needed.  Atrial fibrillation with RVR (HCC) New onset.  Heart rate better on amiodarone drip.  On heparin drip  Hypotension Patient received fluids yesterday.  Holding Lasix and lisinopril.  Holding parameters on Toprol.  As per Dr. Okey Dupre, patient usually has low blood pressure.  Yesterday with standing up and was asymptomatic.  Acute kidney injury superimposed on chronic kidney disease (HCC) Acute kidney injury on CKD stage II.  Creatinine 2.15 on presentation and down to 1.53 today.  Received IV fluids today.  Hold Lasix and lisinopril.  Holding parameters on metoprolol.   H/O mechanical aortic valve replacement Hold Coumadin and continue on heparin drip.  Type 2 diabetes  mellitus with chronic kidney disease, without long-term current use of insulin (HCC) Continue sliding scale insulin.  Hemoglobin A1c 6.3.  Hold metformin.  Depression Continue Wellbutrin XL  Chronic systolic CHF (congestive heart failure) (HCC) Last echo showing EF 35%.  No signs of heart failure this morning.  Holding  Lasix, and lisinopril with acute kidney injury.  Holding parameters on Toprol at lower dose.  On Farxiga.  Gout Continue allopurinol        Subjective: Patient feels fine.  Offers no complaints.  No palpitations.  No chest pain or shortness of breath.  Patient was hypotensive yesterday with cardiac medications and felt okay.  Patient received IV fluids yesterday.  Physical Exam: Vitals:   06/19/23 1200 06/19/23 1210 06/19/23 1320 06/19/23 1356  BP: (!) 101/46   114/84  Pulse: 62 63  69  Resp: 20 17  18   Temp:   97.8 F (36.6 C)   TempSrc:   Oral   SpO2: 98% 100%  100%  Weight:      Height:       Physical Exam HENT:     Head: Normocephalic.     Mouth/Throat:     Pharynx: No oropharyngeal exudate.  Eyes:     General: Lids are normal.     Conjunctiva/sclera: Conjunctivae normal.  Cardiovascular:     Rate and Rhythm: Normal rate. Rhythm irregularly irregular.     Heart sounds: Normal heart sounds, S1 normal and S2 normal.  Pulmonary:     Breath sounds: No decreased breath sounds, wheezing, rhonchi or rales.  Abdominal:     Palpations: Abdomen is soft.     Tenderness: There is no abdominal tenderness.  Musculoskeletal:  Right lower leg: No swelling.     Left lower leg: No swelling.  Skin:    General: Skin is warm.     Findings: No rash.  Neurological:     Mental Status: He is alert and oriented to person, place, and time.     Data Reviewed: INR 2.5 today, creatinine 1.53, hemoglobin 12.9  Disposition: Status is: Inpatient Remains inpatient appropriate because: Awaiting INR to come down prior to cardiac catheterization.  Cardiology to have  EP specialist see on whether a defibrillator is needed.  Planned Discharge Destination: Home    Time spent: 28 minutes  Author: Alford Highland, MD 06/19/2023 3:04 PM  For on call review [email or web address].

## 2023-06-19 NOTE — ED Notes (Signed)
Lab called to come do heparin draw.

## 2023-06-20 ENCOUNTER — Telehealth (HOSPITAL_COMMUNITY): Payer: Self-pay | Admitting: Pharmacy Technician

## 2023-06-20 ENCOUNTER — Inpatient Hospital Stay (HOSPITAL_COMMUNITY)
Admit: 2023-06-20 | Discharge: 2023-06-20 | Disposition: A | Discharge status: Home / Self Care | Payer: 59 | Attending: Medical | Admitting: Medical

## 2023-06-20 ENCOUNTER — Other Ambulatory Visit (HOSPITAL_COMMUNITY): Payer: Self-pay

## 2023-06-20 ENCOUNTER — Encounter
Admission: EM | Disposition: A | Discharge status: Other Acute Inpatient Hospital | Payer: Self-pay | Source: Home / Self Care | Attending: Internal Medicine

## 2023-06-20 DIAGNOSIS — I4891 Unspecified atrial fibrillation: Secondary | ICD-10-CM

## 2023-06-20 DIAGNOSIS — I5020 Unspecified systolic (congestive) heart failure: Secondary | ICD-10-CM | POA: Diagnosis not present

## 2023-06-20 DIAGNOSIS — I472 Ventricular tachycardia, unspecified: Secondary | ICD-10-CM | POA: Diagnosis not present

## 2023-06-20 DIAGNOSIS — I480 Paroxysmal atrial fibrillation: Secondary | ICD-10-CM | POA: Diagnosis not present

## 2023-06-20 DIAGNOSIS — I2510 Atherosclerotic heart disease of native coronary artery without angina pectoris: Secondary | ICD-10-CM

## 2023-06-20 HISTORY — PX: RIGHT HEART CATH AND CORONARY ANGIOGRAPHY: CATH118264

## 2023-06-20 LAB — CBC
HCT: 43.3 % (ref 39.0–52.0)
Hemoglobin: 13.8 g/dL (ref 13.0–17.0)
MCH: 28.1 pg (ref 26.0–34.0)
MCHC: 31.9 g/dL (ref 30.0–36.0)
MCV: 88.2 fL (ref 80.0–100.0)
Platelets: 215 10*3/uL (ref 150–400)
RBC: 4.91 MIL/uL (ref 4.22–5.81)
RDW: 15.9 % — ABNORMAL HIGH (ref 11.5–15.5)
WBC: 9 10*3/uL (ref 4.0–10.5)
nRBC: 0 % (ref 0.0–0.2)

## 2023-06-20 LAB — POCT I-STAT 7, (LYTES, BLD GAS, ICA,H+H)
Acid-base deficit: 3 mmol/L — ABNORMAL HIGH (ref 0.0–2.0)
Acid-base deficit: 5 mmol/L — ABNORMAL HIGH (ref 0.0–2.0)
Bicarbonate: 22.5 mmol/L (ref 20.0–28.0)
Bicarbonate: 19.3 mmol/L — ABNORMAL LOW (ref 20.0–28.0)
Calcium, Ion: 1.22 mmol/L (ref 1.15–1.40)
Calcium, Ion: 1.19 mmol/L (ref 1.15–1.40)
HCT: 37 % — ABNORMAL LOW (ref 39.0–52.0)
HCT: 37 % — ABNORMAL LOW (ref 39.0–52.0)
Hemoglobin: 12.6 g/dL — ABNORMAL LOW (ref 13.0–17.0)
Hemoglobin: 12.6 g/dL — ABNORMAL LOW (ref 13.0–17.0)
O2 Saturation: 53 %
O2 Saturation: 94 %
Potassium: 4.3 mmol/L (ref 3.5–5.1)
Potassium: 4.2 mmol/L (ref 3.5–5.1)
Sodium: 142 mmol/L (ref 135–145)
Sodium: 143 mmol/L (ref 135–145)
TCO2: 24 mmol/L (ref 22–32)
TCO2: 20 mmol/L — ABNORMAL LOW (ref 22–32)
pCO2 arterial: 40.4 mm[Hg] (ref 32–48)
pCO2 arterial: 31.4 mm[Hg] — ABNORMAL LOW (ref 32–48)
pH, Arterial: 7.355 (ref 7.35–7.45)
pH, Arterial: 7.398 (ref 7.35–7.45)
pO2, Arterial: 29 mm[Hg] — CL (ref 83–108)
pO2, Arterial: 70 mm[Hg] — ABNORMAL LOW (ref 83–108)

## 2023-06-20 LAB — ECHOCARDIOGRAM COMPLETE
AR max vel: 1.13 cm2
AV Area VTI: 1.12 cm2
AV Area mean vel: 1 cm2
AV Mean grad: 8 mm[Hg]
AV Peak grad: 13.7 mm[Hg]
Ao pk vel: 1.85 m/s
Area-P 1/2: 3.81 cm2
Height: 66 in
MV VTI: 1.6 cm2
S' Lateral: 5 cm
Weight: 4557.35 [oz_av]

## 2023-06-20 LAB — PROTIME-INR
INR: 2 — ABNORMAL HIGH (ref 0.8–1.2)
Prothrombin Time: 23.1 s — ABNORMAL HIGH (ref 11.4–15.2)

## 2023-06-20 LAB — BASIC METABOLIC PANEL
Anion gap: 10 (ref 5–15)
BUN: 21 mg/dL (ref 8–23)
CO2: 24 mmol/L (ref 22–32)
Calcium: 8.9 mg/dL (ref 8.9–10.3)
Chloride: 107 mmol/L (ref 98–111)
Creatinine, Ser: 1.21 mg/dL (ref 0.61–1.24)
GFR, Estimated: >60 mL/min (ref >60)
Glucose, Bld: 114 mg/dL — ABNORMAL HIGH (ref 70–99)
Potassium: 4.2 mmol/L (ref 3.5–5.1)
Sodium: 141 mmol/L (ref 135–145)

## 2023-06-20 LAB — GLUCOSE, CAPILLARY
Glucose-Capillary: 88 mg/dL (ref 70–99)
Glucose-Capillary: 68 mg/dL — ABNORMAL LOW (ref 70–99)
Glucose-Capillary: 73 mg/dL (ref 70–99)
Glucose-Capillary: 91 mg/dL (ref 70–99)
Glucose-Capillary: 107 mg/dL — ABNORMAL HIGH (ref 70–99)

## 2023-06-20 LAB — HEPARIN LEVEL (UNFRACTIONATED): Heparin Unfractionated: 0.19 [IU]/mL — ABNORMAL LOW (ref 0.30–0.70)

## 2023-06-20 SURGERY — RIGHT HEART CATH AND CORONARY ANGIOGRAPHY
Anesthesia: Moderate Sedation

## 2023-06-20 MED ORDER — HEPARIN SODIUM (PORCINE) 1000 UNIT/ML IJ SOLN
INTRAMUSCULAR | Status: AC
  Filled 2023-06-20: qty 10

## 2023-06-20 MED ORDER — FENTANYL CITRATE (PF) 100 MCG/2ML IJ SOLN
INTRAMUSCULAR | Status: DC | PRN
  Administered 2023-06-20: 25 ug via INTRAVENOUS

## 2023-06-20 MED ORDER — NICOTINE 7 MG/24HR TD PT24
7.0000 mg | MEDICATED_PATCH | Freq: Every day | TRANSDERMAL | Status: DC
  Administered 2023-06-20 – 2023-06-22 (×3): 7 mg via TRANSDERMAL
  Filled 2023-06-20 (×3): qty 1

## 2023-06-20 MED ORDER — PERFLUTREN LIPID MICROSPHERE
1.0000 mL | INTRAVENOUS | Status: AC | PRN
Start: 2023-06-20 — End: 2023-06-20
  Administered 2023-06-20: 5 mL via INTRAVENOUS

## 2023-06-20 MED ORDER — VERAPAMIL HCL 2.5 MG/ML IV SOLN
INTRAVENOUS | Status: DC | PRN
  Administered 2023-06-20 (×2): 2.5 mg via INTRA_ARTERIAL

## 2023-06-20 MED ORDER — ACETAZOLAMIDE SODIUM 500 MG IJ SOLR
500.0000 mg | Freq: Once | INTRAMUSCULAR | Status: AC
  Administered 2023-06-20: 500 mg via INTRAVENOUS
  Filled 2023-06-20: qty 500

## 2023-06-20 MED ORDER — MIDAZOLAM HCL 2 MG/2ML IJ SOLN
INTRAMUSCULAR | Status: DC | PRN
  Administered 2023-06-20: 1 mg via INTRAVENOUS

## 2023-06-20 MED ORDER — FUROSEMIDE 10 MG/ML IJ SOLN
80.0000 mg | Freq: Two times a day (BID) | INTRAMUSCULAR | Status: DC
  Administered 2023-06-20 – 2023-06-22 (×5): 80 mg via INTRAVENOUS
  Filled 2023-06-20 (×5): qty 8

## 2023-06-20 MED ORDER — LIDOCAINE HCL 1 % IJ SOLN
INTRAMUSCULAR | Status: AC
  Filled 2023-06-20: qty 20

## 2023-06-20 MED ORDER — HYDRALAZINE HCL 20 MG/ML IJ SOLN
10.0000 mg | INTRAMUSCULAR | Status: AC | PRN
  Administered 2023-06-20: 10 mg via INTRAVENOUS
  Filled 2023-06-20: qty 1

## 2023-06-20 MED ORDER — VERAPAMIL HCL 2.5 MG/ML IV SOLN
INTRAVENOUS | Status: AC
Start: 2023-06-20 — End: ?
  Filled 2023-06-20: qty 2

## 2023-06-20 MED ORDER — SODIUM CHLORIDE 0.9 % IV SOLN: INTRAVENOUS | Status: AC

## 2023-06-20 MED ORDER — SODIUM CHLORIDE 0.9 % IV SOLN: 250.0000 mL | INTRAVENOUS | Status: AC | PRN

## 2023-06-20 MED ORDER — HEPARIN (PORCINE) IN NACL 1000-0.9 UT/500ML-% IV SOLN
INTRAVENOUS | Status: DC | PRN
  Administered 2023-06-20: 1000 mL

## 2023-06-20 MED ORDER — HEPARIN (PORCINE) 25000 UT/250ML-% IV SOLN
2050.0000 [IU]/h | INTRAVENOUS | Status: DC
Start: 2023-06-20 — End: 2023-06-22
  Administered 2023-06-20 – 2023-06-21 (×3): 1850 [IU]/h via INTRAVENOUS
  Administered 2023-06-22: 2050 [IU]/h via INTRAVENOUS
  Filled 2023-06-20 (×4): qty 250

## 2023-06-20 MED ORDER — LIDOCAINE HCL 1 % IJ SOLN
INTRAMUSCULAR | Status: DC | PRN
  Administered 2023-06-20 (×2): 1 mL

## 2023-06-20 MED ORDER — FUROSEMIDE 10 MG/ML IJ SOLN: 80.0000 mg | Freq: Two times a day (BID) | INTRAMUSCULAR | Status: DC

## 2023-06-20 MED ORDER — SODIUM CHLORIDE 0.9% FLUSH
3.0000 mL | Freq: Two times a day (BID) | INTRAVENOUS | Status: DC
  Administered 2023-06-20 – 2023-06-21 (×3): 3 mL via INTRAVENOUS

## 2023-06-20 MED ORDER — HEPARIN (PORCINE) IN NACL 1000-0.9 UT/500ML-% IV SOLN
INTRAVENOUS | Status: AC
  Filled 2023-06-20: qty 1000

## 2023-06-20 MED ORDER — FUROSEMIDE 10 MG/ML IJ SOLN: 40.0000 mg | Freq: Two times a day (BID) | INTRAMUSCULAR | Status: DC

## 2023-06-20 MED ORDER — HEPARIN SODIUM (PORCINE) 1000 UNIT/ML IJ SOLN
INTRAMUSCULAR | Status: DC | PRN
  Administered 2023-06-20: 5000 [IU] via INTRAVENOUS

## 2023-06-20 MED ORDER — MIDAZOLAM HCL 2 MG/2ML IJ SOLN
INTRAMUSCULAR | Status: AC
  Filled 2023-06-20: qty 2

## 2023-06-20 MED ORDER — IOHEXOL 300 MG/ML  SOLN
INTRAMUSCULAR | Status: DC | PRN
  Administered 2023-06-20: 39 mL

## 2023-06-20 MED ORDER — METOPROLOL SUCCINATE ER 25 MG PO TB24
12.5000 mg | ORAL_TABLET | Freq: Every day | ORAL | Status: DC
  Administered 2023-06-21: 12.5 mg via ORAL
  Filled 2023-06-20: qty 1

## 2023-06-20 MED ORDER — FENTANYL CITRATE (PF) 100 MCG/2ML IJ SOLN
INTRAMUSCULAR | Status: AC
  Filled 2023-06-20: qty 2

## 2023-06-20 MED ORDER — SPIRONOLACTONE 25 MG PO TABS
25.0000 mg | ORAL_TABLET | Freq: Every day | ORAL | Status: DC
  Administered 2023-06-20 – 2023-06-22 (×3): 25 mg via ORAL
  Filled 2023-06-20 (×3): qty 1

## 2023-06-20 MED ORDER — SODIUM CHLORIDE 0.9% FLUSH: 3.0000 mL | INTRAVENOUS | Status: DC | PRN

## 2023-06-20 MED ORDER — HEPARIN BOLUS VIA INFUSION
2700.0000 [IU] | Freq: Once | INTRAVENOUS | Status: AC
  Administered 2023-06-20: 2700 [IU] via INTRAVENOUS
  Filled 2023-06-20: qty 2700

## 2023-06-20 MED ORDER — MAGNESIUM SULFATE 2 GM/50ML IV SOLN
2.0000 g | Freq: Once | INTRAVENOUS | Status: AC
  Administered 2023-06-20: 2 g via INTRAVENOUS
  Filled 2023-06-20: qty 50

## 2023-06-20 SURGICAL SUPPLY — 14 items
CATH BALLN WEDGE 5F 110CM (CATHETERS) IMPLANT
CATH INFINITI 5 FR 3DRC (CATHETERS) IMPLANT
CATH INFINITI 5FR JL4 (CATHETERS) IMPLANT
DEVICE RAD COMP TR BAND LRG (VASCULAR PRODUCTS) IMPLANT
DRAPE BRACHIAL (DRAPES) IMPLANT
GUIDEWIRE INQWIRE 1.5J.035X260 (WIRE) IMPLANT
INQWIRE 1.5J .035X260CM (WIRE) ×1
PACK CARDIAC CATH (CUSTOM PROCEDURE TRAY) ×1 IMPLANT
PAD ELECT DEFIB RADIOL ZOLL (MISCELLANEOUS) IMPLANT
PAD SORBX EP SHIELD 16.5X12 (MISCELLANEOUS) IMPLANT
PROTECTION STATION PRESSURIZED (MISCELLANEOUS) ×1
SET ATX-X65L (MISCELLANEOUS) IMPLANT
SHEATH GLIDE SLENDER 4/5FR (SHEATH) IMPLANT
STATION PROTECTION PRESSURIZED (MISCELLANEOUS) IMPLANT

## 2023-06-20 NOTE — Progress Notes (Signed)
*  PRELIMINARY RESULTS* Echocardiogram 2D Echocardiogram has been performed.  Carolyne Fiscal 06/20/2023, 10:49 AM

## 2023-06-20 NOTE — Consult Note (Signed)
ADVANCED HEART FAILURE CONSULT NOTE  Referring Physician: No ref. provider found  Primary Care: [name], DO Primary Cardiologist: Dr. Okey Dupre  HPI: Roberto [name] is a 67 y.o. male with aortic stenosis status post mechanical AVR, thoracic aortic aneurysm followed by cardiac surgery, chronic systolic heart failure and nonobstructive coronary artery disease currently admitted for decompensated heart failure.  His cardiac history dates back to March 25, 2010 when he underwent mechanical aortic valve replacement; has been on warfarin since that time.  Echocardiogram in 2020 with reduction in LVEF to 40 to 45% and dilation of the ascending aorta measuring 4.6 cm.  In 2022 he underwent left and right heart catheterization which demonstrated 60% stenosis of a large diagonal branch but otherwise only mild disease of the mid LAD and proximal RCA.  In 2020 3 repeat echo with reduction in EF to 35 to 40%.  He will last seen by cardiology here in 2020; at that time reports doing fairly well.  He presented to the emergency department on 06/17/2023 with palpitations that started roughly 12 hours prior to presentation.  Reports feeling very dizzy and short of breath during that time.  Initial EKG was consistent with monomorphic ventricular tachycardia that resolved with IV amiodarone with repeat EKG demonstrating atrial fibrillation with PVCs.  His INR was elevated at that time and left heart catheterization was deferred. LHC with stable nonobstructive CAD; RHC w/ cardiac index severely reduced at 1.9 L/min/m2 with severely elevated filling pressures.   According Roberto [name], at baseline he 'doesn't do much'. He spends most of his time at home with his dogs. He believes that he has been doing fairly well but has noticed increasing shortness of breath over the past few days. According to his girlfriend at bedside he has become much more dyspneic over the past several days with rapid distention of his  abdomen.   Past Medical History:  Diagnosis Date   Bicuspid aortic valve    a. s/p #27 Carbomedics mechanical valve on 03/25/2010; b. on Coumadin; c. TTE 12/17: EF 40-45%, moderately dilated LV with moderate LVH, AVR well-seated with 14 mmHg gradient, peak AV velocity 2.5 m/s, mild mitral valve thickening with mild MR, mildly dilated RV with mildly reduced contraction   Cellulitis    Chronic kidney disease    Chronic systolic CHF (congestive heart failure) (HCC)    a. R/LHC 03/2010 showed no significant CAD, LVEDP 31 mmHg, mean AoV gradient 34 mmHg at rest and 47 mmHg with dobutamine 20 mcg/kg/min, AVA 1.0 cm^2, RA 31, RV 68/25, PA 68/47, PCWP 38. PA sat 65%. CO 6.2 L/min (Fick) and 5.3 L/min (thermodilution)   Clotting disorder (HCC)    H/O mechanical aortic valve replacement 03/25/2010   a. #27 Carbomedics mechanical valve   Hypercholesterolemia    Renal infarct [facility] 2017   Multiple right renal infarcts, likely embolic.   Stroke [facility])    TIA (transient ischemic attack) 05/2014    Current Facility-Administered Medications  Medication Dose Route Frequency Provider Last Rate Last Admin   0.9 %  sodium chloride infusion   Intravenous Continuous End, Christopher, MD 10 mL/hr at 06/20/23 1154 Rate Change at 06/20/23 1154   [MAR Hold] acetaminophen (TYLENOL) tablet 650 mg  650 mg Oral Q6H PRN Mansy, Jan A, MD       Or   [facility] Hold] acetaminophen (TYLENOL) suppository 650 mg  650 mg Rectal Q6H PRN Mansy, Vernetta Honey, MD       [MAR Hold] albuterol (PROVENTIL) (  2.5 MG/3ML) 0.083% nebulizer solution 2.5 mg  2.5 mg Nebulization Q4H PRN Mansy, Vernetta Honey, MD       [MAR Hold] allopurinol (ZYLOPRIM) tablet 300 mg  300 mg Oral Daily Mansy, Jan A, MD   300 mg at 06/19/23 5784   amiodarone (NEXTERONE PREMIX) 360-4.14 MG/200ML-% (1.8 mg/mL) IV infusion  30 mg/hr Intravenous Continuous Chesley Noon, MD 16.67 mL/hr at 06/20/23 1043 30 mg/hr at 06/20/23 1043   [MAR Hold] aspirin chewable tablet 81 mg  81 mg Oral  Daily Mansy, Jan A, MD   81 mg at 06/19/23 0937   [MAR Hold] buPROPion (WELLBUTRIN SR) 12 hr tablet 150 mg  150 mg Oral BID Mansy, Jan A, MD   150 mg at 06/20/23 0848   [MAR Hold] cyclobenzaprine (FLEXERIL) tablet 10 mg  10 mg Oral BID BM & HS PRN Mansy, Vernetta Honey, MD       [MAR Hold] dapagliflozin propanediol (FARXIGA) tablet 10 mg  10 mg Oral Daily Mansy, Jan A, MD   10 mg at 06/20/23 0848   [MAR Hold] fenofibrate tablet 54 mg  54 mg Oral Daily Mansy, Jan A, MD   54 mg at 06/20/23 0848   fentaNYL (SUBLIMAZE) injection    PRN End, Cristal Deer, MD   25 mcg at 06/20/23 1306   Heparin (Porcine) in NaCl 1000-0.9 UT/500ML-% SOLN    PRN End, Cristal Deer, MD   1,000 mL at 06/20/23 1300   heparin ADULT infusion 100 units/mL (25000 units/263mL)  1,850 Units/hr Intravenous Continuous Alford Highland, MD 18.5 mL/hr at 06/20/23 0633 1,850 Units/hr at 06/20/23 0633   heparin sodium (porcine) injection    PRN End, Cristal Deer, MD   5,000 Units at 06/20/23 1331   [MAR Hold] insulin aspart (novoLOG) injection 0-5 Units  0-5 Units Subcutaneous QHS Mansy, Jan A, MD   0 Units at 06/18/23 0009   [facility] Hold] insulin aspart (novoLOG) injection 0-9 Units  0-9 Units Subcutaneous TID WC Mansy, Jan A, MD       iohexol (OMNIPAQUE) 300 MG/ML solution    PRN End, Cristal Deer, MD   39 mL at 06/20/23 1345   lidocaine (XYLOCAINE) 1 % (with pres) injection    PRN End, Cristal Deer, MD   1 mL at 06/20/23 1323   [MAR Hold] magnesium hydroxide (MILK OF MAGNESIA) suspension 30 mL  30 mL Oral Daily PRN Mansy, Vernetta Honey, MD       [facility] Hold] metoprolol succinate (TOPROL-XL) 24 hr tablet 25 mg  25 mg Oral Daily Alford Highland, MD   25 mg at 06/20/23 0848   midazolam (VERSED) injection    PRN End, Cristal Deer, MD   1 mg at 06/20/23 1306   [MAR Hold] omega-3 acid ethyl esters (LOVAZA) capsule 1 g  1 g Oral Daily Mansy, Jan A, MD   1 g at 06/20/23 0848   [MAR Hold] ondansetron (ZOFRAN) tablet 4 mg  4 mg Oral Q6H PRN Mansy, Jan A, MD       Or    [facility] Hold] ondansetron [facility]) injection 4 mg  4 mg Intravenous Q6H PRN Mansy, Vernetta Honey, MD       [facility] Hold] potassium chloride SA (KLOR-CON M) CR tablet 20 mEq  20 mEq Oral Daily Mansy, Jan A, MD   20 mEq at 06/20/23 0848   [MAR Hold] rosuvastatin (CRESTOR) tablet 40 mg  40 mg Oral Daily Mansy, Jan A, MD   40 mg at 06/20/23 0848   [MAR Hold] traZODone (DESYREL) tablet 25  mg  25 mg Oral QHS PRN Mansy, Vernetta Honey, MD       verapamil (ISOPTIN) injection    PRN End, Cristal Deer, MD   2.5 mg at 06/20/23 1340    No Known Allergies    Social History   Socioeconomic History   Marital status: Single    Spouse name: Not on file   Number of children: 1   Years of education: 4   Highest education level: Associate degree: academic program  Occupational History   Occupation: Disabled    Employer: UNEMPLOYED  Tobacco Use   Smoking status: Former    Current packs/day: 0.00    Average packs/day: 0.5 packs/day for 46.0 years (23.0 ttl pk-yrs)    Types: Cigarettes    Start date: 07/16/2017    Quit date: 05/11/2019    Years since quitting: 4.1   Smokeless tobacco: Never  Vaping Use   Vaping status: Never Used  Substance and Sexual Activity   Alcohol use: No    Alcohol/week: 0.0 standard drinks of alcohol   Drug use: No   Sexual activity: Not Currently  Other Topics Concern   Not on file  Social History Narrative   Not on file   Social Drivers of Health   Financial Resource Strain: Low Risk  (08/21/2022)   Overall Financial Resource Strain (CARDIA)    Difficulty of Paying Living Expenses: Not very hard  Food Insecurity: No Food Insecurity (06/20/2023)   Hunger Vital Sign    Worried About Running Out of Food in the Last Year: Never true    Ran Out of Food in the Last Year: Never true  Transportation Needs: No Transportation Needs (06/20/2023)   PRAPARE - Administrator, Civil Service (Medical): No    Lack of Transportation (Non-Medical): No  Physical Activity: Inactive (08/21/2022)    Exercise Vital Sign    Days of Exercise per Week: 0 days    Minutes of Exercise per Session: 0 min  Stress: No Stress Concern Present (08/21/2022)   [employer] of Occupational Health - Occupational Stress Questionnaire    Feeling of Stress : Only a little  Social Connections: Socially Isolated (08/21/2022)   Social Connection and Isolation Panel [NHANES]    Frequency of Communication with Friends and Family: More than three times a week    Frequency of Social Gatherings with Friends and Family: Never    Attends Religious Services: Never    Database administrator or Organizations: No    Attends Banker Meetings: Never    Marital Status: Divorced  Catering manager Violence: Not At Risk (06/20/2023)   Humiliation, Afraid, Rape, and Kick questionnaire    Fear of Current or Ex-Partner: No    Emotionally Abused: No    Physically Abused: No    Sexually Abused: No      Family History  Problem Relation Age of Onset   Arthritis Mother    Dementia Mother    Colon cancer Mother    Arthritis Father    Diabetes Father    Stroke Father    Colon cancer Father    Heart attack Brother    Breast cancer Sister    Seizures Sister    Cancer Brother        brain   Heart disease Brother    Heart attack Brother     PHYSICAL EXAM: Vitals:   06/20/23 1349 06/20/23 1354  BP: (!) 167/152 (!) 141/88  Pulse: 77 69  Resp:  16 18  Temp:    SpO2: (!) 89% 91%   GENERAL: chronically ill appearing WM in NAD HEENT: Negative for arcus senilis or xanthelasma. There is no scleral icterus.  The mucous membranes are pink and moist.   NECK: Supple, No masses. Normal carotid upstrokes without bruits. No masses or thyromegaly.    CHEST: There are no chest wall deformities. There is no chest wall tenderness. Respirations are unlabored.  Lungs- decreased at bases CARDIAC:  JVP: 12-14 cm H2O         Normal S1, S2  Normal rate with regular rhythm. +mechanical click from AVR, rubs or  gallops.  Pulses are 2+ and symmetrical in upper and lower extremities. 2-3+ edema.  ABDOMEN: Soft, non-tender, non-distended. There are no masses or hepatomegaly. There are normal bowel sounds.  EXTREMITIES: Warm and well perfused with no cyanosis, clubbing.  LYMPHATIC: No axillary or supraclavicular lymphadenopathy.  NEUROLOGIC: Patient is oriented x3 with no focal or lateralizing neurologic deficits.  PSYCH: Patients affect is appropriate, there is no evidence of anxiety or depression.  SKIN: Warm and dry; no lesions or wounds.   DATA REVIEW  ECG: 06/18/23: atrial fibrillation; rate controlled as per my personal interpretation 06/17/23: VT   ECHO: 06/20/23: LVEF 25%, normal RV function. Severe TR As per my personal interpretation  CATH: Mild to moderate, non-obstructive coronary artery disease with 60% D1 stenosis and 20% mid LAD and proximal RCA lesions.  No significant change noted from prior angiogram in 2020. Moderately elevated left heart filling pressure (PCWP 30 mmHg with prominent v-waves). Severely elevated right heart filling pressure (RA 25, RVEDP 18 mmHg). Moderate pulmonary hypertension (PA 50/28, mean 35 mmHg). Moderately reduced Fick cardiac output/index (CO 4.4 L/min, CI 1.9 L/min/m^2).   ASSESSMENT & PLAN:  Acute on chronic systolic heart failure  -  Echocardiogram today with LVEF 25% and preserved RV function in the setting of volume overload. Severe TR.  - Cardiac MRI to evaluate for scar burden / LGE; apex appears to be thin and akinetic on contrasted echo imaging.   - On exam patient is severely volume overloaded with significant abdominal distention.  - IV lasix 80mg  BID + diamox 500mg  IV.  - Strict I/Os.  - Based on recent VT and worsening of LVEF, I suspect Mr. Whybrew may require advanced therapies in the future. His only support at this time is his girlfriend. Will plan on starting GDMT, diuresis and close follow up outpatient.  Ventricular  tachycardia - Likely secondary to decompensated heart failure with severely elevated filling pressures and reduced cardiac index.  - Continue amiodarone - EP following.  Mechanical AVR - Will transition back to warfarin tomorrow - Continue heparin gtt - Well functioning valve under fluoro & echo - Discussed warfarin dosing with pharmD.  Atrial fibrillation - TSH 1.8; within normal limits.   5. Tobacco use - Reports that he has started smoking again.  - Will place nicotine patch; discussed with pharmD.    Treveon Bourcier Advanced Heart Failure Mechanical Circulatory Support

## 2023-06-20 NOTE — Care Management Important Message (Signed)
Important Message  Patient Details  Name: Roberto [name] MRN: [id number] Date of Birth: Nov 26, 1955   Important Message Given:  Yes - Medicare IM     [name] 06/20/2023, 3:07 PM

## 2023-06-20 NOTE — Progress Notes (Signed)
PHARMACY - ANTICOAGULATION CONSULT NOTE  Pharmacy Consult for heparin Indication: chest pain/ACS  No Known Allergies  Patient Measurements: Height: 5\' 6"  (167.6 cm) Weight: 129.2 kg (284 lb 13.4 oz) IBW/kg (Calculated) : 63.8 Heparin Dosing Weight: 91.2 kg  Vital Signs: Temp: 98.5 F (36.9 C) (12/18 0325) Temp Source: Oral (12/17 2039) BP: 98/64 (12/18 0325) Pulse Rate: 74 (12/18 0325)  Labs: Recent Labs    06/17/23 2110 06/18/23 0635 06/18/23 0936 06/18/23 1642 06/19/23 0322 06/19/23 0951 06/20/23 0437  HGB 14.9 12.7*  --   --  12.9*  --  13.8  HCT 45.8 39.3  --   --  39.3  --  43.3  PLT 255 191  --   --  200  --  215  LABPROT 24.9*  --  25.9*  --  27.1*  --  23.1*  INR 2.2*  --  2.3*  --  2.5*  --  2.0*  HEPARINUNFRC  --   --   --    < > 0.44 0.41 0.19*  CREATININE 2.15* 1.95*  --   --  1.53*  --  1.21  TROPONINIHS 41* 509*  --   --   --   --   --    < > = values in this interval not displayed.   Estimated Creatinine Clearance: 74.4 mL/min (by C-G formula based on SCr of 1.21 mg/dL).  Medical History: Past Medical History:  Diagnosis Date   Bicuspid aortic valve    a. s/p #27 Carbomedics mechanical valve on 03/25/2010; b. on Coumadin; c. TTE 12/17: EF 40-45%, moderately dilated LV with moderate LVH, AVR well-seated with 14 mmHg gradient, peak AV velocity 2.5 m/s, mild mitral valve thickening with mild MR, mildly dilated RV with mildly reduced contraction   Cellulitis    Chronic kidney disease    Chronic systolic CHF (congestive heart failure) (HCC)    a. R/LHC 03/2010 showed no significant CAD, LVEDP 31 mmHg, mean AoV gradient 34 mmHg at rest and 47 mmHg with dobutamine 20 mcg/kg/min, AVA 1.0 cm^2, RA 31, RV 68/25, PA 68/47, PCWP 38. PA sat 65%. CO 6.2 L/min (Fick) and 5.3 L/min (thermodilution)   Clotting disorder (HCC)    H/O mechanical aortic valve replacement 03/25/2010   a. #27 Carbomedics mechanical valve   Hypercholesterolemia    Renal infarct [facility]) 2017    Multiple right renal infarcts, likely embolic.   Stroke [facility])    TIA (transient ischemic attack) 05/2014   Medications:  Scheduled:   allopurinol  300 mg Oral Daily   aspirin  81 mg Oral Daily   buPROPion  150 mg Oral BID   dapagliflozin propanediol  10 mg Oral Daily   fenofibrate  54 mg Oral Daily   heparin  2,700 Units Intravenous Once   insulin aspart  0-5 Units Subcutaneous QHS   insulin aspart  0-9 Units Subcutaneous TID WC   metoprolol succinate  25 mg Oral Daily   omega-3 acid ethyl esters  1 g Oral Daily   potassium chloride SA  20 mEq Oral Daily   rosuvastatin  40 mg Oral Daily   Assessment: 67 y/o male presenting with palpitations. PMH significant for atrial fibrillation and mechanical valve replacement in 03/2010 (on warfarin PTA). Pharmacy has been consulted to initiate heparin infusion given that patient may need heart catheterization.   Per anticoagulation clinic notes:  Home warfarin regimen is 7.5 mg on Tuesdays and Fridays, 4 mg all other days INR goal is 2.5-3.5  Per patient: last warfarin dose in evening of 12/15; pt cannot remember what dose of warfarin he took  Goal of Therapy:  Heparin level 0.3-0.7 units/ml Monitor platelets by anticoagulation protocol: Yes   INR is 2.3 today, which is below goal. Given subtherapeutic INR, will initiate heparin infusion. Will not give heparin bolus given recent warfarin use.  Labs:  1216 1642 HL = 0.14; subtherapeutic on 1250 units/hr  1217 0322 HL = 0.44; therapeutic X 1  1217 0951 HL = 0.41; therapeutic x 2  1218 0437 HL = 0.19; SUBtherapeutic    Plan:  12/18:  HL @ 0437 = 0.19, SUBtherapeutic  - RN reports heparin gtt has been running without interruption - Will order heparin 2700 units IV X 1 bolus and increase drip rate to 1850 units/hr - Will recheck HL 6 hrs after rate change  Monitor CBC and signs/symptoms of bleeding  Thank you for involving pharmacy in this patient's care.   Damiel Barthold D,  PharmD 06/20/2023 6:30 AM

## 2023-06-20 NOTE — OR Nursing (Signed)
Md notified of gfr tend for last 3 days, Left radial patency D. Doppler both radial and ulner arteries.

## 2023-06-20 NOTE — Progress Notes (Signed)
PROGRESS NOTE    Roberto [name]  WUJ:[id number] DOB: [birth year] DOA: 06/17/2023 PCP: [name], DO  Assessment & Plan:   Principal Problem:   Sustained ventricular tachycardia (HCC) Active Problems:   Atrial fibrillation with RVR (HCC)   Hypotension   Acute kidney injury superimposed on chronic kidney disease (HCC)   H/O mechanical aortic valve replacement   Type 2 diabetes mellitus with chronic kidney disease, without long-term current use of insulin (HCC)   Chronic systolic CHF (congestive heart failure) (HCC)   Depression   Gout  Assessment and Plan: Sustained ventricular tachycardia: converted to a. Fib prior to cardioversion. Will go for cardiac cath today as per cardio   A. Fib: new onset. Continue on tele. Continue on IV heparin & IV amio drip as per cardio   Hypotension: resolved   AKI on CKDII: Cr is labile. Avoid nephrotoxic meds    H/O mechanical aortic valve replacement: holding home warfarin. Continue on IV heparin   DM2: well controlled, HbA1c 6.3. Continue on SSI w/ accuchecks   Depression: severity unknown. Continue on home dose of wellbutrin   Chronic systolic CHF: EF 35. Appears euvolemic. Holding lasix, lisinopril. Cardio following and recs apprec    Gout: continue on allopurinol          DVT prophylaxis: heparin  Code Status: full  Family Communication:  Disposition Plan: unclear  Level of care: Progressive  Status is: Inpatient Remains inpatient appropriate because: severity of illness, cardiac cath today     Consultants:  Cardio   Procedures:   Antimicrobials:    Subjective: Pt c/o fatigue   Objective: Vitals:   06/19/23 2039 06/19/23 2314 06/20/23 0325 06/20/23 0742  BP: (!) 113/90 98/64 98/64  118/78  Pulse: 74 (!) 55 74 72  Resp: 20 18 18    Temp: 97.9 F (36.6 C) 98.2 F (36.8 C) 98.5 F (36.9 C) 98.2 F (36.8 C)  TempSrc: Oral     SpO2: 98% 100% 98% 100%  Weight: 129.2 kg     Height: 5\' 6"  (1.676 m)        Intake/Output Summary (Last 24 hours) at 06/20/2023 1153 Last data filed at 06/20/2023 9562 Gross per 24 hour  Intake 1209.47 ml  Output --  Net 1209.47 ml   Filed Weights   06/17/23 2110 06/19/23 2039  Weight: 117.9 kg 129.2 kg    Examination:  General exam: Appears calm and comfortable  Respiratory system: Clear to auscultation. Respiratory effort normal. Cardiovascular system: S1 & S2+. No rubs, gallops or clicks. Gastrointestinal system: Abdomen is nondistended, soft and nontender.Normal bowel sounds heard. Central nervous system: Alert and oriented.  Psychiatry: Judgement and insight appear normal. Mood & affect appropriate.     Data Reviewed: I have personally reviewed following labs and imaging studies  CBC: Recent Labs  Lab 06/17/23 2110 06/18/23 0635 06/19/23 0322 06/20/23 0437  WBC 9.6 7.9 8.6 9.0  NEUTROABS 6.4  --   --   --   HGB 14.9 12.7* 12.9* 13.8  HCT 45.8 39.3 39.3 43.3  MCV 86.1 86.2 85.1 88.2  PLT 255 191 200 215   Basic Metabolic Panel: Recent Labs  Lab 06/17/23 2110 06/18/23 0635 06/18/23 0936 06/19/23 0322 06/20/23 0437  NA 137 139  --  138 141  K 3.8 3.8  --  3.6 4.2  CL 103 105  --  109 107  CO2 26 26  --  21* 24  GLUCOSE 203* 106*  --  109* 114*  BUN  34* 35*  --  29* 21  CREATININE 2.15* 1.95*  --  1.53* 1.21  CALCIUM 9.2 8.5*  --  8.2* 8.9  MG 2.2  --  2.8*  --   --    GFR: Estimated Creatinine Clearance: 74.4 mL/min (by C-G formula based on SCr of 1.21 mg/dL). Liver Function Tests: Recent Labs  Lab 06/17/23 2110  AST 183*  ALT 113*  ALKPHOS 52  BILITOT 0.8  PROT 7.2  ALBUMIN 4.0   No results for input(s): "LIPASE", "AMYLASE" in the last 168 hours. No results for input(s): "AMMONIA" in the last 168 hours. Coagulation Profile: Recent Labs  Lab 06/17/23 2110 06/18/23 0936 06/19/23 0322 06/20/23 0437  INR 2.2* 2.3* 2.5* 2.0*   Cardiac Enzymes: No results for input(s): "CKTOTAL", "CKMB", "CKMBINDEX",  "TROPONINI" in the last 168 hours. BNP (last 3 results) No results for input(s): "PROBNP" in the last 8760 hours. HbA1C: Recent Labs    06/18/23 0635  HGBA1C 6.3*   CBG: Recent Labs  Lab 06/19/23 0752 06/19/23 1118 06/19/23 1637 06/19/23 2045 06/20/23 0743  GLUCAP 90 154* 113* 89 88   Lipid Profile: No results for input(s): "CHOL", "HDL", "LDLCALC", "TRIG", "CHOLHDL", "LDLDIRECT" in the last 72 hours. Thyroid Function Tests: Recent Labs    06/18/23 0936  TSH 1.817   Anemia Panel: No results for input(s): "VITAMINB12", "FOLATE", "FERRITIN", "TIBC", "IRON", "RETICCTPCT" in the last 72 hours. Sepsis Labs: No results for input(s): "PROCALCITON", "LATICACIDVEN" in the last 168 hours.  No results found for this or any previous visit (from the past 240 hours).       Radiology Studies: No results found.      Scheduled Meds:  allopurinol  300 mg Oral Daily   aspirin  81 mg Oral Daily   buPROPion  150 mg Oral BID   dapagliflozin propanediol  10 mg Oral Daily   fenofibrate  54 mg Oral Daily   insulin aspart  0-5 Units Subcutaneous QHS   insulin aspart  0-9 Units Subcutaneous TID WC   metoprolol succinate  25 mg Oral Daily   omega-3 acid ethyl esters  1 g Oral Daily   potassium chloride SA  20 mEq Oral Daily   rosuvastatin  40 mg Oral Daily   Continuous Infusions:  amiodarone 30 mg/hr (06/20/23 1043)   heparin 1,850 Units/hr (06/20/23 5284)     LOS: 3 days       Charise Killian, MD [facility] Pager [phone]  If 7PM-7AM, please contact night-coverage [email or web address] 06/20/2023, 11:53 AM

## 2023-06-20 NOTE — Telephone Encounter (Signed)
Patient Product/process development scientist completed.    The patient is insured through [facility]. Patient has Medicare and is not eligible for a copay card, but may be able to apply for patient assistance, if available.    Ran test claim for Entresto 24-26 mg and the current 30 day co-pay is $0.00.  Ran test claim for Jardiance 10 mg and the current 30 day co-pay is $0.00.  Ran test claim for Farixga 10 mg and was filled for a 90 supply at [facility] on 05/30/2023  This test claim was processed through [facility]- copay amounts may vary at other pharmacies due to pharmacy/plan contracts, or as the patient moves through the different stages of their insurance plan.     Roland Earl, CPHT Pharmacy Technician III Certified Patient Advocate [facility] Patient Advocate Team Direct Number: [phone]  Fax: [phone]

## 2023-06-20 NOTE — Interval H&P Note (Signed)
History and Physical Interval Note:  06/20/2023 12:42 PM  Roberto [name]  has presented today for surgery, with the diagnosis of ventricular tachycardia and chronic HFrEF.  The various methods of treatment have been discussed with the patient and family. After consideration of risks, benefits and other options for treatment, the patient has consented to  Procedure(s): RIGHT/LEFT HEART CATH AND CORONARY ANGIOGRAPHY (N/A) as a surgical intervention.  The patient's history has been reviewed, patient examined, no change in status, stable for surgery.  I have reviewed the patient's chart and labs.  Questions were answered to the patient's satisfaction.    Cath Lab Visit (complete for each Cath Lab visit)  Clinical Evaluation Leading to the Procedure:   ACS: Yes.    Non-ACS:  N/A  Ariadne Rissmiller

## 2023-06-20 NOTE — Care Management Important Message (Signed)
Important Message  Patient Details  Name: Roberto [name] MRN: [id number] Date of Birth: 13-Feb-1956   Important Message Given:        [name] 06/20/2023, 3:07 PM

## 2023-06-21 ENCOUNTER — Encounter: Payer: Self-pay | Admitting: Internal Medicine

## 2023-06-21 DIAGNOSIS — I5023 Acute on chronic systolic (congestive) heart failure: Secondary | ICD-10-CM

## 2023-06-21 DIAGNOSIS — I472 Ventricular tachycardia, unspecified: Secondary | ICD-10-CM | POA: Diagnosis not present

## 2023-06-21 LAB — CBC
HCT: 37.8 % — ABNORMAL LOW (ref 39.0–52.0)
Hemoglobin: 12.5 g/dL — ABNORMAL LOW (ref 13.0–17.0)
MCH: 28.3 pg (ref 26.0–34.0)
MCHC: 33.1 g/dL (ref 30.0–36.0)
MCV: 85.7 fL (ref 80.0–100.0)
Platelets: 186 10*3/uL (ref 150–400)
RBC: 4.41 MIL/uL (ref 4.22–5.81)
RDW: 15.8 % — ABNORMAL HIGH (ref 11.5–15.5)
WBC: 9.3 10*3/uL (ref 4.0–10.5)
nRBC: 0 % (ref 0.0–0.2)

## 2023-06-21 LAB — GLUCOSE, CAPILLARY
Glucose-Capillary: 157 mg/dL — ABNORMAL HIGH (ref 70–99)
Glucose-Capillary: 112 mg/dL — ABNORMAL HIGH (ref 70–99)
Glucose-Capillary: 102 mg/dL — ABNORMAL HIGH (ref 70–99)

## 2023-06-21 LAB — BASIC METABOLIC PANEL
Anion gap: 10 (ref 5–15)
BUN: 22 mg/dL (ref 8–23)
CO2: 20 mmol/L — ABNORMAL LOW (ref 22–32)
Calcium: 8.7 mg/dL — ABNORMAL LOW (ref 8.9–10.3)
Chloride: 107 mmol/L (ref 98–111)
Creatinine, Ser: 1.39 mg/dL — ABNORMAL HIGH (ref 0.61–1.24)
GFR, Estimated: 55 mL/min — ABNORMAL LOW (ref >60)
Glucose, Bld: 106 mg/dL — ABNORMAL HIGH (ref 70–99)
Potassium: 3.6 mmol/L (ref 3.5–5.1)
Sodium: 137 mmol/L (ref 135–145)

## 2023-06-21 LAB — HEPARIN LEVEL (UNFRACTIONATED)
Heparin Unfractionated: 0.57 [IU]/mL (ref 0.30–0.70)
Heparin Unfractionated: 0.38 [IU]/mL (ref 0.30–0.70)

## 2023-06-21 LAB — MAGNESIUM: Magnesium: 2.5 mg/dL — ABNORMAL HIGH (ref 1.7–2.4)

## 2023-06-21 LAB — PROTIME-INR
INR: 1.8 — ABNORMAL HIGH (ref 0.8–1.2)
Prothrombin Time: 21 s — ABNORMAL HIGH (ref 11.4–15.2)

## 2023-06-21 MED ORDER — ORAL CARE MOUTH RINSE: 15.0000 mL | OROMUCOSAL | Status: DC | PRN

## 2023-06-21 MED ORDER — WARFARIN - PHARMACIST DOSING INPATIENT: Freq: Every day | Status: DC

## 2023-06-21 MED ORDER — METOLAZONE 5 MG PO TABS
5.0000 mg | ORAL_TABLET | Freq: Once | ORAL | Status: AC
  Administered 2023-06-21: 5 mg via ORAL
  Filled 2023-06-21: qty 1

## 2023-06-21 MED ORDER — WARFARIN SODIUM 7.5 MG PO TABS
7.5000 mg | ORAL_TABLET | Freq: Once | ORAL | Status: AC
Start: 2023-06-21 — End: 2023-06-21
  Administered 2023-06-21: 7.5 mg via ORAL
  Filled 2023-06-21: qty 1

## 2023-06-21 MED ORDER — POTASSIUM CHLORIDE CRYS ER 20 MEQ PO TBCR
40.0000 meq | EXTENDED_RELEASE_TABLET | ORAL | Status: AC
  Administered 2023-06-21 (×2): 40 meq via ORAL
  Filled 2023-06-21: qty 4
  Filled 2023-06-21: qty 2

## 2023-06-21 NOTE — Progress Notes (Signed)
Heart Failure Navigator Progress Note  Assessed for Heart & Vascular TOC clinic readiness.  Patient does not meet criteria due to Advanced Heart Failure Team patient of Aditya Sabharwal, DO.   Navigator will sign off at this time.  Roxy Horseman, RN, BSN [facility] Heart Failure Navigator Secure Chat Only

## 2023-06-21 NOTE — Progress Notes (Signed)
PHARMACY - ANTICOAGULATION CONSULT NOTE  Pharmacy Consult for heparin Indication: chest pain/ACS  No Known Allergies  Patient Measurements: Height: 5\' 6"  (167.6 cm) Weight: 129.2 kg (284 lb 13.4 oz) IBW/kg (Calculated) : 63.8 Heparin Dosing Weight: 91.2 kg  Vital Signs: Temp: 98.6 F (37 C) (12/19 0409) Temp Source: Oral (12/19 0409) BP: 94/72 (12/19 0409) Pulse Rate: 68 (12/19 0409)  Labs: Recent Labs    06/18/23 0936 06/18/23 1642 06/19/23 0322 06/19/23 0951 06/20/23 0437 06/20/23 1324 06/20/23 1328 06/21/23 0040 06/21/23 0608  HGB  --    < > 12.9*  --  13.8 12.6* 12.6*  --  12.5*  HCT  --    < > 39.3  --  43.3 37.0* 37.0*  --  37.8*  PLT  --   --  200  --  215  --   --   --  186  LABPROT 25.9*  --  27.1*  --  23.1*  --   --   --   --   INR 2.3*  --  2.5*  --  2.0*  --   --   --   --   HEPARINUNFRC  --    < > 0.44   < > 0.19*  --   --  0.57 0.38  CREATININE  --   --  1.53*  --  1.21  --   --   --  1.39*   < > = values in this interval not displayed.   Estimated Creatinine Clearance: 64.7 mL/min (A) (by C-G formula based on SCr of 1.39 mg/dL (H)).  Medical History: Past Medical History:  Diagnosis Date   Bicuspid aortic valve    a. s/p #27 Carbomedics mechanical valve on 03/25/2010; b. on Coumadin; c. TTE 12/17: EF 40-45%, moderately dilated LV with moderate LVH, AVR well-seated with 14 mmHg gradient, peak AV velocity 2.5 m/s, mild mitral valve thickening with mild MR, mildly dilated RV with mildly reduced contraction   Cellulitis    Chronic kidney disease    Chronic systolic CHF (congestive heart failure) (HCC)    a. R/LHC 03/2010 showed no significant CAD, LVEDP 31 mmHg, mean AoV gradient 34 mmHg at rest and 47 mmHg with dobutamine 20 mcg/kg/min, AVA 1.0 cm^2, RA 31, RV 68/25, PA 68/47, PCWP 38. PA sat 65%. CO 6.2 L/min (Fick) and 5.3 L/min (thermodilution)   Clotting disorder (HCC)    H/O mechanical aortic valve replacement 03/25/2010   a. #27 Carbomedics  mechanical valve   Hypercholesterolemia    Renal infarct [facility]) 2017   Multiple right renal infarcts, likely embolic.   Stroke [place])    TIA (transient ischemic attack) 05/2014   Medications:  Scheduled:   allopurinol  300 mg Oral Daily   aspirin  81 mg Oral Daily   buPROPion  150 mg Oral BID   dapagliflozin propanediol  10 mg Oral Daily   fenofibrate  54 mg Oral Daily   furosemide  80 mg Intravenous BID   insulin aspart  0-5 Units Subcutaneous QHS   insulin aspart  0-9 Units Subcutaneous TID WC   metoprolol succinate  12.5 mg Oral Daily   nicotine  7 mg Transdermal Daily   omega-3 acid ethyl esters  1 g Oral Daily   potassium chloride SA  20 mEq Oral Daily   rosuvastatin  40 mg Oral Daily   sodium chloride flush  3 mL Intravenous Q12H   spironolactone  25 mg Oral Daily   Assessment: 67 y/o  male presenting with palpitations. PMH significant for atrial fibrillation and mechanical aortic valve replacement in 03/2010 (on warfarin PTA). Pharmacy has been consulted to initiate heparin infusion given that patient may need heart catheterization. CHADSVASc of 5. CBC is stable.   Per anticoagulation clinic notes:  Home warfarin regimen is 7.5 mg on Tuesdays and Fridays, 4 mg all other days INR goal is 2.5-3.5  Per patient: last warfarin dose in evening of 12/15; pt cannot remember what dose of warfarin he took  Goal of Therapy:  Heparin level 0.3-0.7 units/ml Monitor platelets by anticoagulation protocol: Yes  Labs:  1216 1642 HL = 0.14; subtherapeutic on 1250 units/hr  1217 0322 HL = 0.44; therapeutic X 1  1217 0951 HL = 0.41; therapeutic x 2  1218 0437 HL = 0.19; SUBtherapeutic  1219 0040 HL = 0.57, therapeutic X 1  1219 0608 HL = 0.38    Plan:  Heparin level is therapeutic. Will continue heparin infusion 1850 units/hr. Recheck heparin level and CBC with AM labs. Would recommend a heparin bridge if warfarin is restarted until INR is > 2.5. Warfarin has been held for 3 days.    Thank you for involving pharmacy in this patient's care.   Ronnald Ramp, PharmD 06/21/2023 7:35 AM

## 2023-06-21 NOTE — Consult Note (Signed)
ELECTROPHYSIOLOGY CONSULT NOTE    Patient ID: Mucaad Schierman MRN: [id number], DOB/AGE: 09-28-1955 67 y.o.  Admit date: 06/17/2023 Date of Consult: 06/21/2023  Primary Physician: [name], DO Primary Cardiologist: Yvonne Kendall, MD  Electrophysiologist: new to Dr. Lalla Brothers     Patient Profile: Tryce Genaw is a 67 y.o. male with a history of aortic stenosis s/p mechanical AVR, thoracic aortic aneurysm, HFrEF, Non-obs CAD who is being seen today for the evaluation of VT at the request of Dr. Mayford Knife.  HPI:  Makeen Greenough is a 67 y.o. male with PMH as above presented to [facility] ER 12/15 with palpitations, dizzy, and SOB. His initial EKG was concerning for monomorphic VT. He was given IV amiodarone and converted to afib. His INR was elevated so LHC deferred until 12/18, which showed stable non-obs CAD. RHC with severely reduced CI and elevated filling pressures. Adv HF was consulted post-procedure.   Today, he is feeling ok. His back is hurting because of the hospital bed. He denies chest pain, chest pressure, palpitations. He says his breathing is much better and abd is softer. States he is urinating often. Has good appetite, denies bloating.   He sees Dr. Laneta Simmers every 6 months to monitor his aortic aneurysm. He says that surgery is planned soon.  His girlfriend, Selena Batten, joins [place] via telephone.   Labs Potassium3.6 (12/19 7253) Magnesium  2.5* (12/19 6644) Creatinine, ser  1.39* (12/19 0347) PLT  186 (12/19 4259) HGB  12.5* (12/19 5638) WBC 9.3 (12/19 7564)  .    Past Medical History:  Diagnosis Date   Bicuspid aortic valve    a. s/p #27 Carbomed[employer] mechanical valve on 03/25/2010; b. on Coumadin; c. TTE 12/17: EF 40-45%, moderately dilated LV with moderate LVH, AVR well-seated with 14 mmHg gradient, peak AV velocity 2.5 m/s, mild mitral valve thickening with mild MR, mildly dilated RV with mildly reduced contraction   Cellulitis    Chronic kidney disease    Chronic  systolic CHF (congestive heart failure) (HCC)    a. R/LHC 03/2010 showed no significant CAD, LVEDP 31 mmHg, mean AoV gradient 34 mmHg at rest and 47 mmHg with dobutamine 20 mcg/kg/min, AVA 1.0 cm^2, RA 31, RV 68/25, PA 68/47, PCWP 38. PA sat 65%. CO 6.2 L/min (Fick) and 5.3 L/min (thermodilution)   Clotting disorder (HCC)    H/O mechanical aortic valve replacement 03/25/2010   a. #27 Carbomed[employer] mechanical valve   Hypercholesterolemia    Renal infarct [facility]) 2017   Multiple right renal infarcts, likely embolic.   Stroke [facility])    TIA (transient ischemic attack) 05/2014     Surgical History:  Past Surgical History:  Procedure Laterality Date   AORTIC VALVE REPLACEMENT     CARDIAC CATHETERIZATION  03/21/2010   No significant CAD. Severe aortic stenosis. Severely elevated left and right heart filling pressures.   CARDIAC SURGERY  2009   CHF   CARPAL TUNNEL RELEASE Left 2005   RIGHT HEART CATH AND CORONARY ANGIOGRAPHY N/A 01/28/2019   Procedure: RIGHT HEART CATH AND CORONARY ANGIOGRAPHY;  Surgeon: Yvonne Kendall, MD;  Location: [facility] INVASIVE CV LAB;  Service: Cardiovascular;  Laterality: N/A;   TONSILLECTOMY  1962     Medications Prior to Admission  Medication Sig Dispense Refill Last Dose/Taking   albuterol (PROVENTIL) (2.5 MG/3ML) 0.083% nebulizer solution USE 1 VIAL IN NEBULIZER EVERY 6 HOURS AS NEEDED FOR WHEEZING FOR SHORTNESS OF BREATH 180 mL 1 Past Week   allopurinol (ZYLOPRIM) 300 MG tablet Take 1  tablet (300 mg total) by mouth daily. 90 tablet 1 06/17/2023 Morning   aspirin 81 MG chewable tablet Chew 81 mg by mouth daily.   06/17/2023 Morning   buPROPion (WELLBUTRIN SR) 150 MG 12 hr tablet Take 1 pill in the AM for 3 days, then increase to 1 pill BID. Pick a day in the 2nd week to quit (Patient taking differently: 150 mg 2 (two) times daily. Take 1 pill in the AM for 3 days, then increase to 1 pill BID. Pick a day in the 2nd week to quit) 60 tablet 3 06/17/2023 at  3:00 PM    dapagliflozin propanediol (FARXIGA) 10 MG TABS tablet Take 1 tablet (10 mg total) by mouth daily. 90 tablet 3 06/17/2023 Morning   fenofibrate (TRICOR) 48 MG tablet Take 1 tablet (48 mg total) by mouth daily. 90 tablet 3 06/17/2023 at  3:00 PM   furosemide (LASIX) 80 MG tablet Take 1 tablet (80 mg total) by mouth daily. 90 tablet 3 06/17/2023 Morning   lisinopril (ZESTRIL) 2.5 MG tablet Take 1 tablet (2.5 mg total) by mouth daily. 90 tablet 3 06/17/2023 Morning   metFORMIN (GLUCOPHAGE-XR) 500 MG 24 hr tablet Take 1 tablet (500 mg total) by mouth daily with breakfast. 90 tablet 1 06/17/2023 Morning   metoprolol succinate (TOPROL-XL) 100 MG 24 hr tablet Take 1 tablet (100 mg total) by mouth daily. (Patient taking differently: Take 50 mg by mouth daily.) 90 tablet 3 06/17/2023   nicotine (NICODERM CQ) 7 mg/24hr patch Place 1 patch (7 mg total) onto the skin daily. 28 patch 3 Past Week   nortriptyline (PAMELOR) 10 MG capsule Take 1 capsule (10 mg total) by mouth at bedtime. 90 capsule 1 06/17/2023 Bedtime   potassium chloride SA (KLOR-CON M) 20 MEQ tablet Take 1 tablet (20 mEq total) by mouth daily. 90 tablet 3 06/17/2023 at  3:00 PM   rosuvastatin (CRESTOR) 40 MG tablet Take 1 tablet (40 mg total) by mouth daily. 90 tablet 3 06/17/2023 at  3:00 PM   Semaglutide, 1 MG/DOSE, 4 MG/3ML SOPN Inject 1 mg as directed once a week. 3 mL 6 06/11/2023 Morning   TRELEGY ELLIPTA 100-62.5-25 MCG/ACT AEPB Inhale 1 puff by mouth once daily 180 each 1 Past Week   VENTOLIN HFA 108 (90 Base) MCG/ACT inhaler INHALE 2 PUFFS BY MOUTH EVERY 6 HOURS AS NEEDED FOR WHEEZING OR SHORTNESS OF BREATH 54 g 6 06/17/2023 Morning   warfarin (COUMADIN) 4 MG tablet TAKE 1 TABLET BY MOUTH ONCE DAILY AT  6PM  AS  DIRECTED 180 tablet 3 06/16/2023 at  3:00 PM   warfarin (COUMADIN) 7.5 MG tablet TAKE 4MG  BY MOUTH FOR 2 DAYS, THEN TAKE 7.5MG  ON THE THIRD DAY, THEN REPEAT 90 tablet 1 06/17/2023 at  3:00 PM   cyclobenzaprine (FLEXERIL) 10 MG tablet  Take 1 tablet (10 mg total) by mouth 3 times/day as needed-between meals & bedtime for muscle spasms. (Patient not taking: Reported on 06/18/2023) 180 tablet 0 Not Taking   Omega-3 Fatty Acids (FISH OIL) 1000 MG CAPS Take 5,000 mg by mouth daily. (Patient not taking: Reported on 06/18/2023)   Not Taking    Inpatient Medications:   allopurinol  300 mg Oral Daily   aspirin  81 mg Oral Daily   buPROPion  150 mg Oral BID   dapagliflozin propanediol  10 mg Oral Daily   fenofibrate  54 mg Oral Daily   furosemide  80 mg Intravenous BID   insulin aspart  0-5  Units Subcutaneous QHS   insulin aspart  0-9 Units Subcutaneous TID WC   metoprolol succinate  12.5 mg Oral Daily   nicotine  7 mg Transdermal Daily   omega-3 acid ethyl esters  1 g Oral Daily   potassium chloride SA  20 mEq Oral Daily   rosuvastatin  40 mg Oral Daily   sodium chloride flush  3 mL Intravenous Q12H   spironolactone  25 mg Oral Daily    Allergies: No Known Allergies  Family History  Problem Relation Age of Onset   Arthritis Mother    Dementia Mother    Colon cancer Mother    Arthritis Father    Diabetes Father    Stroke Father    Colon cancer Father    Heart attack Brother    Breast cancer Sister    Seizures Sister    Cancer Brother        brain   Heart disease Brother    Heart attack Brother      Physical Exam: Vitals:   06/20/23 1905 06/20/23 1935 06/20/23 2258 06/21/23 0409  BP:  104/79 105/78 94/72  Pulse:  66 72 68  Resp: (!) 22 19 20 20   Temp:  98.4 F (36.9 C) 98.3 F (36.8 C) 98.6 F (37 C)  TempSrc:  Oral Oral Oral  SpO2:  97% 98% 100%  Weight:      Height:        GEN- NAD, A&O x 3, normal affect HEENT: Normocephalic, atraumatic Lungs- CTAB, Normal effort.  Heart- Irregular rate and rhythm, No M/G/R.  GI- Soft but easily compressible, NT,  Extremities- No clubbing, cyanosis, 2-3+ Bilat lower extremity edema   Radiology/Studies: CARDIAC CATHETERIZATION Result Date:  06/20/2023 Conclusions: Mild to moderate, non-obstructive coronary artery disease with 60% D1 stenosis and 20% mid LAD and proximal RCA lesions.  No significant change noted from prior angiogram in 2020. Moderately elevated left heart filling pressure (PCWP 30 mmHg with prominent v-waves). Severely elevated right heart filling pressure (RA 25, RVEDP 18 mmHg). Moderate pulmonary hypertension (PA 50/28, mean 35 mmHg). Moderately reduced Fick cardiac output/index (CO 4.4 L/min, CI 1.9 L/min/m^2). Recommendations: Resume diuresis.  If renal function worsens and/or adequate diuresis cannot be achieved, inotropic therapy may need to be considered. Reduce metoprolol succinate in the setting of low-output heart failure. Consult advanced heart failure and electrophysiology teams to assist with management of acute on chronic HFrEF and sustained ventricular tachycardia. Resume heparin infusion 2 hours after TR band has been removed; transition back to warfarin once it is clear that no further invasive procedures will be needed this admission. Yvonne Kendall, MD [facility]  ECHOCARDIOGRAM COMPLETE Result Date: 06/20/2023    ECHOCARDIOGRAM REPORT   Patient Name:   ASPEN JHA Date of Exam: 06/20/2023 Medical Rec #:  [id number]       Height:       66.0 in Accession #:    [id number]      Weight:       284.8 lb Date of Birth:  [birth year]       BSA:          2.324 m Patient Age:    67 years        BP:           118/78 mmHg Patient Gender: M               HR:           80 bpm. Exam Location:  [facility] Procedure: 2D Echo, Cardiac Doppler, Color Doppler and Intracardiac            Opacification Agent Indications:     Atrial Fibrillation  History:         Patient has prior history of Echocardiogram examinations, most                  recent 04/03/2022. CHF and Cardiomyopathy, CAD, COPD and TIA,                  Aortic Valve Disease, Arrythmias:Atrial Fibrillation and                  Tachycardia; Risk Factors:Hypertension,  Diabetes, Dyslipidemia                  and Former Smoker. CKD, There is a 27mm Carbometrics mechanical                  valve in the Aortic position.  Sonographer:     Mikki Harbor Referring Phys:  [id number] CADENCE H FURTH Diagnosing Phys: Julien Nordmann MD  Sonographer Comments: Technically difficult study due to poor echo windows and patient is obese. IMPRESSIONS  1. Left ventricular ejection fraction, by estimation, is 20 to 25%. The left ventricle has severely decreased function. The left ventricle demonstrates global hypokinesis. The left ventricular internal cavity size was mildly dilated. There is mild left ventricular hypertrophy. Left ventricular diastolic parameters are indeterminate.  2. Right ventricular systolic function is moderately reduced. The right ventricular size is normal. There is mildly elevated pulmonary artery systolic pressure. The estimated right ventricular systolic pressure is 39.6 mmHg.  3. Left atrial size was moderately dilated.  4. The mitral valve is normal in structure. Mild mitral valve regurgitation. No evidence of mitral stenosis.  5. Tricuspid valve regurgitation is moderate to severe.  6. The aortic valve has been repaired/replaced. There is a 27 mm Carbometrics mechanical valve present. Aortic valve regurgitation is not visualized. No aortic stenosis is present. Aortic valve mean gradient measures 8.0 mmHg.  7. There is mild dilatation of the aortic arch, measuring 43 mm.  8. The inferior vena cava is dilated in size with <50% respiratory variability, suggesting right atrial pressure of 15 mmHg. FINDINGS  Left Ventricle: Left ventricular ejection fraction, by estimation, is 20 to 25%. The left ventricle has severely decreased function. The left ventricle demonstrates global hypokinesis. Definity contrast agent was given IV to delineate the left ventricular endocardial borders. The left ventricular internal cavity size was mildly dilated. There is mild left ventricular  hypertrophy. Left ventricular diastolic parameters are indeterminate. Right Ventricle: The right ventricular size is normal. No increase in right ventricular wall thickness. Right ventricular systolic function is moderately reduced. There is mildly elevated pulmonary artery systolic pressure. The tricuspid regurgitant velocity is 2.48 m/s, and with an assumed right atrial pressure of 15 mmHg, the estimated right ventricular systolic pressure is 39.6 mmHg. Left Atrium: Left atrial size was moderately dilated. Right Atrium: Right atrial size was normal in size. Pericardium: There is no evidence of pericardial effusion. Mitral Valve: The mitral valve is normal in structure. Mild mitral valve regurgitation. No evidence of mitral valve stenosis. MV peak gradient, 8.9 mmHg. The mean mitral valve gradient is 4.0 mmHg. Tricuspid Valve: The tricuspid valve is normal in structure. Tricuspid valve regurgitation is moderate to severe. No evidence of tricuspid stenosis. Aortic Valve: The aortic valve has been repaired/replaced. Aortic valve regurgitation is not visualized. No aortic stenosis  is present. Aortic valve mean gradient measures 8.0 mmHg. Aortic valve peak gradient measures 13.7 mmHg. Aortic valve area, by VTI  measures 1.12 cm. There is a mechanical valve present in the aortic position. Pulmonic Valve: The pulmonic valve was normal in structure. Pulmonic valve regurgitation is not visualized. No evidence of pulmonic stenosis. Aorta: The aortic root is normal in size and structure. There is mild dilatation of the aortic arch, measuring 43 mm. Venous: The inferior vena cava is dilated in size with less than 50% respiratory variability, suggesting right atrial pressure of 15 mmHg. IAS/Shunts: No atrial level shunt detected by color flow Doppler.  LEFT VENTRICLE PLAX 2D LVIDd:         5.30 cm LVIDs:         5.00 cm LV PW:         1.40 cm LV IVS:        1.30 cm LVOT diam:     2.00 cm LV SV:         40 LV SV Index:   17  LVOT Area:     3.14 cm  RIGHT VENTRICLE RV Basal diam:  4.90 cm RV Mid diam:    3.50 cm RV S prime:     7.87 cm/s LEFT ATRIUM             Index        RIGHT ATRIUM           Index LA diam:        6.00 cm 2.58 cm/m   RA Area:     41.90 cm LA Vol (A2C):   99.2 ml 42.68 ml/m  RA Volume:   174.00 ml 74.87 ml/m LA Vol (A4C):   69.0 ml 29.69 ml/m LA Biplane Vol: 82.7 ml 35.58 ml/m  AORTIC VALVE                     PULMONIC VALVE AV Area (Vmax):    1.13 cm      PV Vmax:       1.19 m/s AV Area (Vmean):   1.00 cm      PV Peak grad:  5.7 mmHg AV Area (VTI):     1.12 cm AV Vmax:           185.33 cm/s AV Vmean:          133.667 cm/s AV VTI:            0.360 m AV Peak Grad:      13.7 mmHg AV Mean Grad:      8.0 mmHg LVOT Vmax:         66.73 cm/s LVOT Vmean:        42.433 cm/s LVOT VTI:          0.128 m LVOT/AV VTI ratio: 0.36  AORTA Ao Root diam: 3.40 cm MITRAL VALVE                TRICUSPID VALVE MV Area (PHT): 3.81 cm     TR Peak grad:   24.6 mmHg MV Area VTI:   1.60 cm     TR Vmax:        248.00 cm/s MV Peak grad:  8.9 mmHg MV Mean grad:  4.0 mmHg     SHUNTS MV Vmax:       1.49 m/s     Systemic VTI:  0.13 m MV Vmean:      91.7 cm/s    Systemic  Diam: 2.00 cm MV Decel Time: 199 msec MV E velocity: 140.00 cm/s Julien Nordmann MD Electronically signed by Julien Nordmann MD Signature Date/Time: 06/20/2023/2:26:04 PM    Final    DG Chest Portable 1 View Result Date: 06/17/2023 CLINICAL DATA:  Shortness of breath EXAM: PORTABLE CHEST 1 VIEW COMPARISON:  Chest x-ray 11/04/2018 FINDINGS: The heart is enlarged. The lungs are clear. There is no pleural effusion or pneumothorax. No acute fractures are seen. Sternotomy wires are present. IMPRESSION: Cardiomegaly. No acute cardiopulmonary process. Electronically Signed   By: Darliss Cheney M.D.   On: 06/17/2023 21:56    EKG: (personally reviewed) 06/18/2023 - afib with LBBB, rate 65 12/15 at 2115 - regular, monomorphic at 191bmp, RBBB >> highly suggestive of  VT   TELEMETRY: AFib 60s (personally reviewed)   Assessment/Plan: #) Monomorphic VT #) NICM #) HFrEF - LVEF 20-25% new #) s/p mechanical AVR #) Thoracic aortic aneurysm #) atrial fibrillation  VT quiescent Continue IV amiodarone at this time Aggressive diuresis per Adv HF Patient meets criteria for ICD, pending hospital course. Anticipate ICD placement next week Ok to transition to PO warfarin Thryoid labs stable Keep K > 4, Mag > 2        For questions or updates, please contact [facility] Please consult [email or web address] for contact info under Cardiology/STEMI.  Signed, Sherie Don, NP  06/21/2023 7:41 AM

## 2023-06-21 NOTE — Progress Notes (Addendum)
PHARMACY - ANTICOAGULATION CONSULT NOTE  Pharmacy Consult for heparin/warfarin Indication: atrial fibrillation + mechanical valve AVR  No Known Allergies  Patient Measurements: Height: 5\' 6"  (167.6 cm) Weight: 129.2 kg (284 lb 13.4 oz) IBW/kg (Calculated) : 63.8 Heparin Dosing Weight: 91.2 kg  Vital Signs: Temp: 98.7 F (37.1 C) (12/19 0807) Temp Source: Oral (12/19 0807) BP: 116/76 (12/19 0807) Pulse Rate: 69 (12/19 0807)  Labs: Recent Labs    06/19/23 0322 06/19/23 0951 06/20/23 0437 06/20/23 1324 06/20/23 1328 06/21/23 0040 06/21/23 0608  HGB 12.9*  --  13.8 12.6* 12.6*  --  12.5*  HCT 39.3  --  43.3 37.0* 37.0*  --  37.8*  PLT 200  --  215  --   --   --  186  LABPROT 27.1*  --  23.1*  --   --   --  21.0*  INR 2.5*  --  2.0*  --   --   --  1.8*  HEPARINUNFRC 0.44   < > 0.19*  --   --  0.57 0.38  CREATININE 1.53*  --  1.21  --   --   --  1.39*   < > = values in this interval not displayed.   Estimated Creatinine Clearance: 64.7 mL/min (A) (by C-G formula based on SCr of 1.39 mg/dL (H)).  Medical History: Past Medical History:  Diagnosis Date   Bicuspid aortic valve    a. s/p #27 [employer] mechanical valve on 03/25/2010; b. on Coumadin; c. TTE 12/17: EF 40-45%, moderately dilated LV with moderate LVH, AVR well-seated with 14 mmHg gradient, peak AV velocity 2.5 m/s, mild mitral valve thickening with mild MR, mildly dilated RV with mildly reduced contraction   Cellulitis    Chronic kidney disease    Chronic systolic CHF (congestive heart failure) (HCC)    a. R/LHC 03/2010 showed no significant CAD, LVEDP 31 mmHg, mean AoV gradient 34 mmHg at rest and 47 mmHg with dobutamine 20 mcg/kg/min, AVA 1.0 cm^2, RA 31, RV 68/25, PA 68/47, PCWP 38. PA sat 65%. CO 6.2 L/min (Fick) and 5.3 L/min (thermodilution)   Clotting disorder (HCC)    H/O mechanical aortic valve replacement 03/25/2010   a. #27 [employer] mechanical valve   Hypercholesterolemia    Renal infarct [facility])  2017   Multiple right renal infarcts, likely embolic.   Stroke [facility])    TIA (transient ischemic attack) 05/2014   Medications:  Scheduled:   allopurinol  300 mg Oral Daily   aspirin  81 mg Oral Daily   buPROPion  150 mg Oral BID   dapagliflozin propanediol  10 mg Oral Daily   fenofibrate  54 mg Oral Daily   furosemide  80 mg Intravenous BID   insulin aspart  0-5 Units Subcutaneous QHS   insulin aspart  0-9 Units Subcutaneous TID WC   metolazone  5 mg Oral Once   metoprolol succinate  12.5 mg Oral Daily   nicotine  7 mg Transdermal Daily   omega-3 acid ethyl esters  1 g Oral Daily   potassium chloride SA  20 mEq Oral Daily   potassium chloride  40 mEq Oral Q4H   rosuvastatin  40 mg Oral Daily   sodium chloride flush  3 mL Intravenous Q12H   spironolactone  25 mg Oral Daily   Assessment: 67 y/o male presenting with palpitations. PMH significant for atrial fibrillation and mechanical aortic valve replacement in 03/2010 (on warfarin PTA). Pharmacy has been consulted to initiate heparin infusion given  that patient may need heart catheterization. CHADS2VASc 5 (HTN, DM, CAD, Age, HTN).   Per anticoagulation clinic notes:  Home warfarin regimen is 7.5 mg on Tuesdays and Fridays, 4 mg all other days INR goal is 2.5-3.5  Per patient: last warfarin dose in evening of 12/15; pt cannot remember what dose of warfarin he took  Heparin             Goal of Therapy:  Heparin level 0.3-0.7 units/ml Monitor platelets by anticoagulation protocol: Yes  Labs:  1216 1642 HL = 0.14; subtherapeutic on 1250 units/hr  1217 0322 HL = 0.44; therapeutic X 1  1217 0951 HL = 0.41; therapeutic x 2  1218 0437 HL = 0.19; SUBtherapeutic  1219 0040 HL = 0.57, therapeutic X 1  1219 0608 HL = 0.38   Plan:  Heparin level is therapeutic.  Will continue heparin infusion 1850 units/hr.  Recheck heparin level and CBC with AM labs.  Would recommend a heparin bridge if warfarin is restarted until INR is > 2.5.   Warfarin has been held for 3 days  Warfarin             Goal of Therapy: INR 2.5 - 3.5  Monitor platelets by anticoagulation protocol: Yes  INR Date INR Clinical  12/19 1.8 Baseline (3x holding)  12/20             Plan:  Bridge with heparin until INR is > 2.5 then can stop  Give patient 7.5 mg today   Thank you for involving pharmacy in this patient's care.   Effie Shy, PharmD Pharmacy Resident  06/21/2023 10:47 AM

## 2023-06-21 NOTE — Progress Notes (Signed)
PHARMACY - ANTICOAGULATION CONSULT NOTE  Pharmacy Consult for heparin Indication: chest pain/ACS  No Known Allergies  Patient Measurements: Height: 5\' 6"  (167.6 cm) Weight: 129.2 kg (284 lb 13.4 oz) IBW/kg (Calculated) : 63.8 Heparin Dosing Weight: 91.2 kg  Vital Signs: Temp: 98.3 F (36.8 C) (12/18 2258) Temp Source: Oral (12/18 2258) BP: 105/78 (12/18 2258) Pulse Rate: 72 (12/18 2258)  Labs: Recent Labs    06/18/23 3762 06/18/23 0936 06/18/23 1642 06/19/23 0322 06/19/23 0951 06/20/23 0437 06/20/23 1324 06/20/23 1328 06/21/23 0040  HGB 12.7*  --   --  12.9*  --  13.8 12.6* 12.6*  --   HCT 39.3  --   --  39.3  --  43.3 37.0* 37.0*  --   PLT 191  --   --  200  --  215  --   --   --   LABPROT  --  25.9*  --  27.1*  --  23.1*  --   --   --   INR  --  2.3*  --  2.5*  --  2.0*  --   --   --   HEPARINUNFRC  --   --    < > 0.44 0.41 0.19*  --   --  0.57  CREATININE 1.95*  --   --  1.53*  --  1.21  --   --   --   TROPONINIHS 509*  --   --   --   --   --   --   --   --    < > = values in this interval not displayed.   Estimated Creatinine Clearance: 74.4 mL/min (by C-G formula based on SCr of 1.21 mg/dL).  Medical History: Past Medical History:  Diagnosis Date   Bicuspid aortic valve    a. s/p #27 [employer] mechanical valve on 03/25/2010; b. on Coumadin; c. TTE 12/17: EF 40-45%, moderately dilated LV with moderate LVH, AVR well-seated with 14 mmHg gradient, peak AV velocity 2.5 m/s, mild mitral valve thickening with mild MR, mildly dilated RV with mildly reduced contraction   Cellulitis    Chronic kidney disease    Chronic systolic CHF (congestive heart failure) (HCC)    a. R/LHC 03/2010 showed no significant CAD, LVEDP 31 mmHg, mean AoV gradient 34 mmHg at rest and 47 mmHg with dobutamine 20 mcg/kg/min, AVA 1.0 cm^2, RA 31, RV 68/25, PA 68/47, PCWP 38. PA sat 65%. CO 6.2 L/min (Fick) and 5.3 L/min (thermodilution)   Clotting disorder (HCC)    H/O mechanical aortic valve  replacement 03/25/2010   a. #27 [employer] mechanical valve   Hypercholesterolemia    Renal infarct [facility]) 2017   Multiple right renal infarcts, likely embolic.   Stroke [facility])    TIA (transient ischemic attack) 05/2014   Medications:  Scheduled:   allopurinol  300 mg Oral Daily   aspirin  81 mg Oral Daily   buPROPion  150 mg Oral BID   dapagliflozin propanediol  10 mg Oral Daily   fenofibrate  54 mg Oral Daily   furosemide  80 mg Intravenous BID   insulin aspart  0-5 Units Subcutaneous QHS   insulin aspart  0-9 Units Subcutaneous TID WC   metoprolol succinate  12.5 mg Oral Daily   nicotine  7 mg Transdermal Daily   omega-3 acid ethyl esters  1 g Oral Daily   potassium chloride SA  20 mEq Oral Daily   rosuvastatin  40 mg Oral Daily  sodium chloride flush  3 mL Intravenous Q12H   spironolactone  25 mg Oral Daily   Assessment: 67 y/o male presenting with palpitations. PMH significant for atrial fibrillation and mechanical valve replacement in 03/2010 (on warfarin PTA). Pharmacy has been consulted to initiate heparin infusion given that patient may need heart catheterization.   Per anticoagulation clinic notes:  Home warfarin regimen is 7.5 mg on Tuesdays and Fridays, 4 mg all other days INR goal is 2.5-3.5  Per patient: last warfarin dose in evening of 12/15; pt cannot remember what dose of warfarin he took  Goal of Therapy:  Heparin level 0.3-0.7 units/ml Monitor platelets by anticoagulation protocol: Yes   INR is 2.3 today, which is below goal. Given subtherapeutic INR, will initiate heparin infusion. Will not give heparin bolus given recent warfarin use.  Labs:  1216 1642 HL = 0.14; subtherapeutic on 1250 units/hr  1217 0322 HL = 0.44; therapeutic X 1  1217 0951 HL = 0.41; therapeutic x 2  1218 0437 HL = 0.19; SUBtherapeutic  1219 0040 HL = 0.57, therapeutic X 1    Plan:  12/19:  HL @ 0040 = 0.57, therapeutic X 1 - Will continue pt on current rate and recheck HL on  12/19 @ 0700.  Monitor CBC and signs/symptoms of bleeding  Thank you for involving pharmacy in this patient's care.   Jaeli Grubb D, PharmD 06/21/2023 1:39 AM

## 2023-06-21 NOTE — Progress Notes (Signed)
   06/21/23 1500  Spiritual Encounters  Type of Visit Initial  Care provided to: Pt and family  Referral source Patient request  Reason for visit Advance directives  OnCall Visit No   Chaplain received a spiritual consult for an AD. Chaplain educated patient and his family member about the paperwork. Patient and family member will fill out the paperwork later and will contact chaplain services when completed. Chaplain informed them that chaplain services is available for spiritual and emotional support whenever they need.

## 2023-06-21 NOTE — Progress Notes (Signed)
Advanced Heart Failure Rounding Note  Cardiologist: Yvonne Kendall, MD   Subjective:   Chief Complaint: A/C HFrEF  Yesterday diuresed with IV lasix + Diamox.   SOB with exertion.    Objective:   Weight Range: 129.2 kg Body mass index is 45.97 kg/m.   Vital Signs:   Temp:  [98.2 F (36.8 C)-98.7 F (37.1 C)] 98.7 F (37.1 C) (12/19 0807) Pulse Rate:  [58-122] 69 (12/19 0807) Resp:  [14-25] 16 (12/19 0807) BP: (92-167)/(55-152) 116/76 (12/19 0807) SpO2:  [89 %-100 %] 100 % (12/19 0807)    Weight change: Filed Weights   06/17/23 2110 06/19/23 2039  Weight: 117.9 kg 129.2 kg    Intake/Output:   Intake/Output Summary (Last 24 hours) at 06/21/2023 1124 Last data filed at 06/21/2023 0635 Gross per 24 hour  Intake 868 ml  Output 400 ml  Net 468 ml      Physical Exam    General:   No resp difficulty HEENT: Normal Neck: Supple. JVP to jaw . Carotids 2+ bilat; no bruits. No lymphadenopathy or thyromegaly appreciated. Cor: PMI nondisplaced. Regular rate & rhythm. No rubs, gallops or murmurs. Lungs: Clear Abdomen: Soft, nontender, distended. No hepatosplenomegaly. No bruits or masses. Good bowel sounds. Extremities: No cyanosis, clubbing, rash, R and LLE 2+ edema Neuro: Alert & orientedx3, cranial nerves grossly intact. moves all 4 extremities w/o difficulty. Affect pleasant   Telemetry   SR   EKG    N/A   Labs    CBC Recent Labs    06/20/23 0437 06/20/23 1324 06/20/23 1328 06/21/23 0608  WBC 9.0  --   --  9.3  HGB 13.8   < > 12.6* 12.5*  HCT 43.3   < > 37.0* 37.8*  MCV 88.2  --   --  85.7  PLT 215  --   --  186   < > = values in this interval not displayed.   Basic Metabolic Panel Recent Labs    60/45/40 0437 06/20/23 1324 06/20/23 1328 06/21/23 0608  NA 141   < > 143 137  K 4.2   < > 4.2 3.6  CL 107  --   --  107  CO2 24  --   --  20*  GLUCOSE 114*  --   --  106*  BUN 21  --   --  22  CREATININE 1.21  --   --  1.39*  CALCIUM  8.9  --   --  8.7*  MG  --   --   --  2.5*   < > = values in this interval not displayed.   Liver Function Tests No results for input(s): "AST", "ALT", "ALKPHOS", "BILITOT", "PROT", "ALBUMIN" in the last 72 hours. No results for input(s): "LIPASE", "AMYLASE" in the last 72 hours. Cardiac Enzymes No results for input(s): "CKTOTAL", "CKMB", "CKMBINDEX", "TROPONINI" in the last 72 hours.  BNP: BNP (last 3 results) Recent Labs    06/17/23 2110  BNP 224.7*    ProBNP (last 3 results) No results for input(s): "PROBNP" in the last 8760 hours.   D-Dimer No results for input(s): "DDIMER" in the last 72 hours. Hemoglobin A1C No results for input(s): "HGBA1C" in the last 72 hours. Fasting Lipid Panel No results for input(s): "CHOL", "HDL", "LDLCALC", "TRIG", "CHOLHDL", "LDLDIRECT" in the last 72 hours. Thyroid Function Tests No results for input(s): "TSH", "T4TOTAL", "T3FREE", "THYROIDAB" in the last 72 hours.  Invalid input(s): "FREET3"  Other results:   Imaging  CARDIAC CATHETERIZATION Result Date: 06/20/2023 Conclusions: Mild to moderate, non-obstructive coronary artery disease with 60% D1 stenosis and 20% mid LAD and proximal RCA lesions.  No significant change noted from prior angiogram in 2020. Moderately elevated left heart filling pressure (PCWP 30 mmHg with prominent v-waves). Severely elevated right heart filling pressure (RA 25, RVEDP 18 mmHg). Moderate pulmonary hypertension (PA 50/28, mean 35 mmHg). Moderately reduced Fick cardiac output/index (CO 4.4 L/min, CI 1.9 L/min/m^2). Recommendations: Resume diuresis.  If renal function worsens and/or adequate diuresis cannot be achieved, inotropic therapy may need to be considered. Reduce metoprolol succinate in the setting of low-output heart failure. Consult advanced heart failure and electrophysiology teams to assist with management of acute on chronic HFrEF and sustained ventricular tachycardia. Resume heparin infusion 2  hours after TR band has been removed; transition back to warfarin once it is clear that no further invasive procedures will be needed this admission. Yvonne Kendall, MD [facility]    Medications:     Scheduled Medications:  allopurinol  300 mg Oral Daily   aspirin  81 mg Oral Daily   buPROPion  150 mg Oral BID   dapagliflozin propanediol  10 mg Oral Daily   fenofibrate  54 mg Oral Daily   furosemide  80 mg Intravenous BID   insulin aspart  0-5 Units Subcutaneous QHS   insulin aspart  0-9 Units Subcutaneous TID WC   metolazone  5 mg Oral Once   metoprolol succinate  12.5 mg Oral Daily   nicotine  7 mg Transdermal Daily   omega-3 acid ethyl esters  1 g Oral Daily   potassium chloride SA  20 mEq Oral Daily   potassium chloride  40 mEq Oral Q4H   rosuvastatin  40 mg Oral Daily   sodium chloride flush  3 mL Intravenous Q12H   spironolactone  25 mg Oral Daily   warfarin  7.5 mg Oral ONCE-1600   Warfarin - Pharmacist Dosing Inpatient   Does not apply q1600    Infusions:  sodium chloride 10 mL/hr at 06/21/23 0635   sodium chloride     amiodarone 30 mg/hr (06/21/23 0635)   heparin 1,850 Units/hr (06/21/23 0635)    PRN Medications: sodium chloride, acetaminophen **OR** acetaminophen, albuterol, cyclobenzaprine, magnesium hydroxide, ondansetron **OR** ondansetron (ZOFRAN) IV, mouth rinse, sodium chloride flush, traZODone   Assessment/Plan  Acute on chronic systolic heart failure  -  Echocardiogram with LVEF 25% and preserved RV function in the setting of volume overload. Severe TR.  - Cardiac MRI to evaluate for scar burden / LGE; apex appears to be thin and akinetic on contrasted echo imaging.   - Based on recent VT and worsening of LVEF, suspect Roberto [name] may require advanced therapies in the future. His only support at this time is his girlfriend.  -Remains volume overloaded. Continue lasix 80 mg twice a day and will give 5 mg metolazone. Needs daily standing weight and  strict I/Os.  - GDMT - Hold bb for now with acute decompensation.  - Continue spironolactone 25 mg daily.  - No room of ARB/ARNi - Continue farxiga 10 mg daily.    Ventricular tachycardia - Likely secondary to decompensated heart failure with severely elevated filling pressures and reduced cardiac index.  - No VT overnight. Continue amio drip 30 mg per hour.  - EP consulted.    Mechanical AVR - Will transition back to warfarin  - Continue heparin gtt - Well functioning valve under fluoro & echo - Discussed warfarin dosing  with pharmD.   Atrial fibrillation - TSH 1.8; within normal limits.  - On Heparin drip. INR 1.8 .    5. Tobacco use - Reports that he has started smoking again.  - Will place nicotine patch; discussed with pharmD.     Length of Stay: 4  Elizjah Noblet, NP  06/21/2023, 11:24 AM  Advanced Heart Failure Team Pager [phone] (M-F; 7a - 5p)  Please contact [facility] for night-coverage after hours (5p -7a ) and weekends on [email or web address]

## 2023-06-21 NOTE — Progress Notes (Signed)
PROGRESS NOTE    Roberto [name]  JXB:[id number] DOB: 07/22/1955 DOA: 06/17/2023 PCP: [name], DO  Assessment & Plan:   Principal Problem:   Sustained ventricular tachycardia (HCC) Active Problems:   Atrial fibrillation with RVR (HCC)   Hypotension   Acute kidney injury superimposed on chronic kidney disease (HCC)   H/O mechanical aortic valve replacement   Type 2 diabetes mellitus with chronic kidney disease, without long-term current use of insulin (HCC)   Chronic systolic CHF (congestive heart failure) (HCC)   Depression   Gout  Assessment and Plan: Acute on chronic systolic CHF: EF 13%  Continue on lasix as per cardio. Monitor I/Os. Continue on aldactone, metolazone. Holding lisinopril. Cardio following and recs apprec   Sustained ventricular tachycardia: continue on amio drip. S/p cardiac cath. No stents were placed  A. fib: new onset. Continue on tele. Continue on IV heparin & IV amio drip as per cardio   Hypotension: resolved   AKI on CKDII: Cr is labile. Avoid nephrotoxic meds    H/O mechanical aortic valve replacement: d/c IV heparin drip and restart warfarin. Pharmacy to dose and monitor warfarin & INR   DM2: well controlled, HbA1c 6.3. Continue on SSI w/ accuchecks   Depression: severity unknown. Continue on home dose of bupropion    Gout: continue on allopurinol    Morbidly obese: BMI 45.9. Complicates overall care & prognosis      DVT prophylaxis: warfarin Code Status: full  Family Communication: discussed pt's care w/ pt's friend, Selena Batten, and answered her questions  Disposition Plan: unclear  Level of care: Progressive  Status is: Inpatient Remains inpatient appropriate because: severity of illness, requiring IV lasix     Consultants:  Cardio   Procedures:   Antimicrobials:    Subjective: Pt c/o shortness of breath   Objective: Vitals:   06/20/23 1935 06/20/23 2258 06/21/23 0409 06/21/23 0807  BP: 104/79 105/78 94/72 116/76   Pulse: 66 72 68 69  Resp: 19 20 20 16   Temp: 98.4 F (36.9 C) 98.3 F (36.8 C) 98.6 F (37 C) 98.7 F (37.1 C)  TempSrc: Oral Oral Oral Oral  SpO2: 97% 98% 100% 100%  Weight:      Height:        Intake/Output Summary (Last 24 hours) at 06/21/2023 1044 Last data filed at 06/21/2023 0635 Gross per 24 hour  Intake 868 ml  Output 400 ml  Net 468 ml   Filed Weights   06/17/23 2110 06/19/23 2039  Weight: 117.9 kg 129.2 kg    Examination:  General exam: appears uncomfortable. Morbidly obese Respiratory system: decreased breath sounds b/l Cardiovascular system: S1/S2+. No rubs or clicks  Gastrointestinal system: Abd is soft, NT, obese & hypoactive bowel sounds Central nervous system: alert & oriented. Moves all extremities  Psychiatry: Judgement and insight appears normal. Appropriate mood and affect    Data Reviewed: I have personally reviewed following labs and imaging studies  CBC: Recent Labs  Lab 06/17/23 2110 06/18/23 0635 06/19/23 0322 06/20/23 0437 06/20/23 1324 06/20/23 1328 06/21/23 0608  WBC 9.6 7.9 8.6 9.0  --   --  9.3  NEUTROABS 6.4  --   --   --   --   --   --   HGB 14.9 12.7* 12.9* 13.8 12.6* 12.6* 12.5*  HCT 45.8 39.3 39.3 43.3 37.0* 37.0* 37.8*  MCV 86.1 86.2 85.1 88.2  --   --  85.7  PLT 255 191 200 215  --   --  186   Basic Metabolic Panel: Recent Labs  Lab 06/17/23 2110 06/18/23 0635 06/18/23 0936 06/19/23 0322 06/20/23 0437 06/20/23 1324 06/20/23 1328 06/21/23 0608  NA 137 139  --  138 141 142 143 137  K 3.8 3.8  --  3.6 4.2 4.3 4.2 3.6  CL 103 105  --  109 107  --   --  107  CO2 26 26  --  21* 24  --   --  20*  GLUCOSE 203* 106*  --  109* 114*  --   --  106*  BUN 34* 35*  --  29* 21  --   --  22  CREATININE 2.15* 1.95*  --  1.53* 1.21  --   --  1.39*  CALCIUM 9.2 8.5*  --  8.2* 8.9  --   --  8.7*  MG 2.2  --  2.8*  --   --   --   --  2.5*   GFR: Estimated Creatinine Clearance: 64.7 mL/min (A) (by C-G formula based on SCr of  1.39 mg/dL (H)). Liver Function Tests: Recent Labs  Lab 06/17/23 2110  AST 183*  ALT 113*  ALKPHOS 52  BILITOT 0.8  PROT 7.2  ALBUMIN 4.0   No results for input(s): "LIPASE", "AMYLASE" in the last 168 hours. No results for input(s): "AMMONIA" in the last 168 hours. Coagulation Profile: Recent Labs  Lab 06/17/23 2110 06/18/23 0936 06/19/23 0322 06/20/23 0437 06/21/23 0608  INR 2.2* 2.3* 2.5* 2.0* 1.8*   Cardiac Enzymes: No results for input(s): "CKTOTAL", "CKMB", "CKMBINDEX", "TROPONINI" in the last 168 hours. BNP (last 3 results) No results for input(s): "PROBNP" in the last 8760 hours. HbA1C: No results for input(s): "HGBA1C" in the last 72 hours.  CBG: Recent Labs  Lab 06/20/23 1156 06/20/23 1409 06/20/23 1614 06/20/23 2138 06/21/23 0808  GLUCAP 68* 73 91 107* 157*   Lipid Profile: No results for input(s): "CHOL", "HDL", "LDLCALC", "TRIG", "CHOLHDL", "LDLDIRECT" in the last 72 hours. Thyroid Function Tests: No results for input(s): "TSH", "T4TOTAL", "FREET4", "T3FREE", "THYROIDAB" in the last 72 hours.  Anemia Panel: No results for input(s): "VITAMINB12", "FOLATE", "FERRITIN", "TIBC", "IRON", "RETICCTPCT" in the last 72 hours. Sepsis Labs: No results for input(s): "PROCALCITON", "LATICACIDVEN" in the last 168 hours.  No results found for this or any previous visit (from the past 240 hours).       Radiology Studies: CARDIAC CATHETERIZATION Result Date: 06/20/2023 Conclusions: Mild to moderate, non-obstructive coronary artery disease with 60% D1 stenosis and 20% mid LAD and proximal RCA lesions.  No significant change noted from prior angiogram in 2020. Moderately elevated left heart filling pressure (PCWP 30 mmHg with prominent v-waves). Severely elevated right heart filling pressure (RA 25, RVEDP 18 mmHg). Moderate pulmonary hypertension (PA 50/28, mean 35 mmHg). Moderately reduced Fick cardiac output/index (CO 4.4 L/min, CI 1.9 L/min/m^2).  Recommendations: Resume diuresis.  If renal function worsens and/or adequate diuresis cannot be achieved, inotropic therapy may need to be considered. Reduce metoprolol succinate in the setting of low-output heart failure. Consult advanced heart failure and electrophysiology teams to assist with management of acute on chronic HFrEF and sustained ventricular tachycardia. Resume heparin infusion 2 hours after TR band has been removed; transition back to warfarin once it is clear that no further invasive procedures will be needed this admission. Yvonne Kendall, MD [facility]  ECHOCARDIOGRAM COMPLETE Result Date: 06/20/2023    ECHOCARDIOGRAM REPORT   Patient Name:   Roberto [name] Date of Exam: 06/20/2023  Medical Rec #:  [id number]       Height:       66.0 in Accession #:    [id number]      Weight:       284.8 lb Date of Birth:  1955/07/29       BSA:          2.324 m Patient Age:    67 years        BP:           118/78 mmHg Patient Gender: M               HR:           80 bpm. Exam Location:  [facility] Procedure: 2D Echo, Cardiac Doppler, Color Doppler and Intracardiac            Opacification Agent Indications:     Atrial Fibrillation  History:         Patient has prior history of Echocardiogram examinations, most                  recent 04/03/2022. CHF and Cardiomyopathy, CAD, COPD and TIA,                  Aortic Valve Disease, Arrythmias:Atrial Fibrillation and                  Tachycardia; Risk Factors:Hypertension, Diabetes, Dyslipidemia                  and Former Smoker. CKD, There is a 27mm Carbometrics mechanical                  valve in the Aortic position.  Sonographer:     Mikki Harbor Referring Phys:  [id number] CADENCE H FURTH Diagnosing Phys: Julien Nordmann MD  Sonographer Comments: Technically difficult study due to poor echo windows and patient is obese. IMPRESSIONS  1. Left ventricular ejection fraction, by estimation, is 20 to 25%. The left ventricle has severely decreased function. The left  ventricle demonstrates global hypokinesis. The left ventricular internal cavity size was mildly dilated. There is mild left ventricular hypertrophy. Left ventricular diastolic parameters are indeterminate.  2. Right ventricular systolic function is moderately reduced. The right ventricular size is normal. There is mildly elevated pulmonary artery systolic pressure. The estimated right ventricular systolic pressure is 39.6 mmHg.  3. Left atrial size was moderately dilated.  4. The mitral valve is normal in structure. Mild mitral valve regurgitation. No evidence of mitral stenosis.  5. Tricuspid valve regurgitation is moderate to severe.  6. The aortic valve has been repaired/replaced. There is a 27 mm Carbometrics mechanical valve present. Aortic valve regurgitation is not visualized. No aortic stenosis is present. Aortic valve mean gradient measures 8.0 mmHg.  7. There is mild dilatation of the aortic arch, measuring 43 mm.  8. The inferior vena cava is dilated in size with <50% respiratory variability, suggesting right atrial pressure of 15 mmHg. FINDINGS  Left Ventricle: Left ventricular ejection fraction, by estimation, is 20 to 25%. The left ventricle has severely decreased function. The left ventricle demonstrates global hypokinesis. Definity contrast agent was given IV to delineate the left ventricular endocardial borders. The left ventricular internal cavity size was mildly dilated. There is mild left ventricular hypertrophy. Left ventricular diastolic parameters are indeterminate. Right Ventricle: The right ventricular size is normal. No increase in right ventricular wall thickness. Right ventricular systolic function is moderately reduced. There is mildly elevated pulmonary artery systolic  pressure. The tricuspid regurgitant velocity is 2.48 m/s, and with an assumed right atrial pressure of 15 mmHg, the estimated right ventricular systolic pressure is 39.6 mmHg. Left Atrium: Left atrial size was moderately  dilated. Right Atrium: Right atrial size was normal in size. Pericardium: There is no evidence of pericardial effusion. Mitral Valve: The mitral valve is normal in structure. Mild mitral valve regurgitation. No evidence of mitral valve stenosis. MV peak gradient, 8.9 mmHg. The mean mitral valve gradient is 4.0 mmHg. Tricuspid Valve: The tricuspid valve is normal in structure. Tricuspid valve regurgitation is moderate to severe. No evidence of tricuspid stenosis. Aortic Valve: The aortic valve has been repaired/replaced. Aortic valve regurgitation is not visualized. No aortic stenosis is present. Aortic valve mean gradient measures 8.0 mmHg. Aortic valve peak gradient measures 13.7 mmHg. Aortic valve area, by VTI  measures 1.12 cm. There is a mechanical valve present in the aortic position. Pulmonic Valve: The pulmonic valve was normal in structure. Pulmonic valve regurgitation is not visualized. No evidence of pulmonic stenosis. Aorta: The aortic root is normal in size and structure. There is mild dilatation of the aortic arch, measuring 43 mm. Venous: The inferior vena cava is dilated in size with less than 50% respiratory variability, suggesting right atrial pressure of 15 mmHg. IAS/Shunts: No atrial level shunt detected by color flow Doppler.  LEFT VENTRICLE PLAX 2D LVIDd:         5.30 cm LVIDs:         5.00 cm LV PW:         1.40 cm LV IVS:        1.30 cm LVOT diam:     2.00 cm LV SV:         40 LV SV Index:   17 LVOT Area:     3.14 cm  RIGHT VENTRICLE RV Basal diam:  4.90 cm RV Mid diam:    3.50 cm RV S prime:     7.87 cm/s LEFT ATRIUM             Index        RIGHT ATRIUM           Index LA diam:        6.00 cm 2.58 cm/m   RA Area:     41.90 cm LA Vol (A2C):   99.2 ml 42.68 ml/m  RA Volume:   174.00 ml 74.87 ml/m LA Vol (A4C):   69.0 ml 29.69 ml/m LA Biplane Vol: 82.7 ml 35.58 ml/m  AORTIC VALVE                     PULMONIC VALVE AV Area (Vmax):    1.13 cm      PV Vmax:       1.19 m/s AV Area (Vmean):    1.00 cm      PV Peak grad:  5.7 mmHg AV Area (VTI):     1.12 cm AV Vmax:           185.33 cm/s AV Vmean:          133.667 cm/s AV VTI:            0.360 m AV Peak Grad:      13.7 mmHg AV Mean Grad:      8.0 mmHg LVOT Vmax:         66.73 cm/s LVOT Vmean:        42.433 cm/s LVOT VTI:  0.128 m LVOT/AV VTI ratio: 0.36  AORTA Ao Root diam: 3.40 cm MITRAL VALVE                TRICUSPID VALVE MV Area (PHT): 3.81 cm     TR Peak grad:   24.6 mmHg MV Area VTI:   1.60 cm     TR Vmax:        248.00 cm/s MV Peak grad:  8.9 mmHg MV Mean grad:  4.0 mmHg     SHUNTS MV Vmax:       1.49 m/s     Systemic VTI:  0.13 m MV Vmean:      91.7 cm/s    Systemic Diam: 2.00 cm MV Decel Time: 199 msec MV E velocity: 140.00 cm/s Julien Nordmann MD Electronically signed by Julien Nordmann MD Signature Date/Time: 06/20/2023/2:26:04 PM    Final         Scheduled Meds:  allopurinol  300 mg Oral Daily   aspirin  81 mg Oral Daily   buPROPion  150 mg Oral BID   dapagliflozin propanediol  10 mg Oral Daily   fenofibrate  54 mg Oral Daily   furosemide  80 mg Intravenous BID   insulin aspart  0-5 Units Subcutaneous QHS   insulin aspart  0-9 Units Subcutaneous TID WC   metolazone  5 mg Oral Once   metoprolol succinate  12.5 mg Oral Daily   nicotine  7 mg Transdermal Daily   omega-3 acid ethyl esters  1 g Oral Daily   potassium chloride SA  20 mEq Oral Daily   potassium chloride  40 mEq Oral Q4H   rosuvastatin  40 mg Oral Daily   sodium chloride flush  3 mL Intravenous Q12H   spironolactone  25 mg Oral Daily   Continuous Infusions:  sodium chloride 10 mL/hr at 06/21/23 9629   sodium chloride     amiodarone 30 mg/hr (06/21/23 0635)   heparin 1,850 Units/hr (06/21/23 0635)     LOS: 4 days       Charise Killian, MD [facility] Pager [phone]  If 7PM-7AM, please contact night-coverage [email or web address] 06/21/2023, 10:44 AM

## 2023-06-22 ENCOUNTER — Encounter (HOSPITAL_COMMUNITY): Payer: Self-pay

## 2023-06-22 ENCOUNTER — Inpatient Hospital Stay (HOSPITAL_COMMUNITY)
Admission: EM | Admit: 2023-06-22 | Discharge: 2023-06-26 | DRG: 276 | Disposition: A | Discharge status: Home / Self Care | Payer: 59 | Source: Other Acute Inpatient Hospital | Attending: Cardiology | Admitting: Cardiology

## 2023-06-22 DIAGNOSIS — I255 Ischemic cardiomyopathy: Secondary | ICD-10-CM | POA: Diagnosis present

## 2023-06-22 DIAGNOSIS — Z8673 Personal history of transient ischemic attack (TIA), and cerebral infarction without residual deficits: Secondary | ICD-10-CM

## 2023-06-22 DIAGNOSIS — Z7982 Long term (current) use of aspirin: Secondary | ICD-10-CM | POA: Diagnosis not present

## 2023-06-22 DIAGNOSIS — I4892 Unspecified atrial flutter: Secondary | ICD-10-CM | POA: Diagnosis present

## 2023-06-22 DIAGNOSIS — E7800 Pure hypercholesterolemia, unspecified: Secondary | ICD-10-CM | POA: Diagnosis present

## 2023-06-22 DIAGNOSIS — I5023 Acute on chronic systolic (congestive) heart failure: Secondary | ICD-10-CM | POA: Diagnosis not present

## 2023-06-22 DIAGNOSIS — I472 Ventricular tachycardia, unspecified: Principal | ICD-10-CM | POA: Diagnosis present

## 2023-06-22 DIAGNOSIS — I4819 Other persistent atrial fibrillation: Secondary | ICD-10-CM | POA: Diagnosis present

## 2023-06-22 DIAGNOSIS — I428 Other cardiomyopathies: Secondary | ICD-10-CM | POA: Diagnosis present

## 2023-06-22 DIAGNOSIS — R791 Abnormal coagulation profile: Secondary | ICD-10-CM | POA: Diagnosis present

## 2023-06-22 DIAGNOSIS — Z800 Family history of malignant neoplasm of digestive organs: Secondary | ICD-10-CM

## 2023-06-22 DIAGNOSIS — Z6841 Body Mass Index (BMI) 40.0 and over, adult: Secondary | ICD-10-CM | POA: Diagnosis not present

## 2023-06-22 DIAGNOSIS — N1831 Chronic kidney disease, stage 3a: Secondary | ICD-10-CM | POA: Diagnosis present

## 2023-06-22 DIAGNOSIS — Z7951 Long term (current) use of inhaled steroids: Secondary | ICD-10-CM

## 2023-06-22 DIAGNOSIS — I429 Cardiomyopathy, unspecified: Secondary | ICD-10-CM | POA: Diagnosis not present

## 2023-06-22 DIAGNOSIS — Z8249 Family history of ischemic heart disease and other diseases of the circulatory system: Secondary | ICD-10-CM

## 2023-06-22 DIAGNOSIS — Z7984 Long term (current) use of oral hypoglycemic drugs: Secondary | ICD-10-CM

## 2023-06-22 DIAGNOSIS — Z820 Family history of epilepsy and other diseases of the nervous system: Secondary | ICD-10-CM

## 2023-06-22 DIAGNOSIS — Z833 Family history of diabetes mellitus: Secondary | ICD-10-CM

## 2023-06-22 DIAGNOSIS — Z809 Family history of malignant neoplasm, unspecified: Secondary | ICD-10-CM

## 2023-06-22 DIAGNOSIS — Z79899 Other long term (current) drug therapy: Secondary | ICD-10-CM | POA: Diagnosis not present

## 2023-06-22 DIAGNOSIS — E669 Obesity, unspecified: Secondary | ICD-10-CM | POA: Diagnosis present

## 2023-06-22 DIAGNOSIS — Z8261 Family history of arthritis: Secondary | ICD-10-CM | POA: Diagnosis not present

## 2023-06-22 DIAGNOSIS — Z7985 Long-term (current) use of injectable non-insulin antidiabetic drugs: Secondary | ICD-10-CM

## 2023-06-22 DIAGNOSIS — Z803 Family history of malignant neoplasm of breast: Secondary | ICD-10-CM

## 2023-06-22 DIAGNOSIS — Z7901 Long term (current) use of anticoagulants: Secondary | ICD-10-CM | POA: Diagnosis not present

## 2023-06-22 DIAGNOSIS — I2510 Atherosclerotic heart disease of native coronary artery without angina pectoris: Secondary | ICD-10-CM | POA: Diagnosis present

## 2023-06-22 DIAGNOSIS — Z823 Family history of stroke: Secondary | ICD-10-CM

## 2023-06-22 DIAGNOSIS — F17210 Nicotine dependence, cigarettes, uncomplicated: Secondary | ICD-10-CM | POA: Diagnosis present

## 2023-06-22 DIAGNOSIS — Z952 Presence of prosthetic heart valve: Secondary | ICD-10-CM | POA: Diagnosis not present

## 2023-06-22 LAB — CBC
HCT: 38.4 % — ABNORMAL LOW (ref 39.0–52.0)
Hemoglobin: 12.4 g/dL — ABNORMAL LOW (ref 13.0–17.0)
MCH: 27.9 pg (ref 26.0–34.0)
MCHC: 32.3 g/dL (ref 30.0–36.0)
MCV: 86.3 fL (ref 80.0–100.0)
Platelets: 190 10*3/uL (ref 150–400)
RBC: 4.45 MIL/uL (ref 4.22–5.81)
RDW: 15.9 % — ABNORMAL HIGH (ref 11.5–15.5)
WBC: 8.8 10*3/uL (ref 4.0–10.5)
nRBC: 0 % (ref 0.0–0.2)

## 2023-06-22 LAB — BASIC METABOLIC PANEL
Anion gap: 12 (ref 5–15)
BUN: 27 mg/dL — ABNORMAL HIGH (ref 8–23)
CO2: 24 mmol/L (ref 22–32)
Calcium: 9.2 mg/dL (ref 8.9–10.3)
Chloride: 102 mmol/L (ref 98–111)
Creatinine, Ser: 1.53 mg/dL — ABNORMAL HIGH (ref 0.61–1.24)
GFR, Estimated: 49 mL/min — ABNORMAL LOW (ref >60)
Glucose, Bld: 104 mg/dL — ABNORMAL HIGH (ref 70–99)
Potassium: 3.8 mmol/L (ref 3.5–5.1)
Sodium: 138 mmol/L (ref 135–145)

## 2023-06-22 LAB — PROTIME-INR
INR: 1.6 — ABNORMAL HIGH (ref 0.8–1.2)
Prothrombin Time: 18.8 s — ABNORMAL HIGH (ref 11.4–15.2)

## 2023-06-22 LAB — GLUCOSE, CAPILLARY
Glucose-Capillary: 127 mg/dL — ABNORMAL HIGH (ref 70–99)
Glucose-Capillary: 101 mg/dL — ABNORMAL HIGH (ref 70–99)
Glucose-Capillary: 118 mg/dL — ABNORMAL HIGH (ref 70–99)

## 2023-06-22 LAB — HEPARIN LEVEL (UNFRACTIONATED)
Heparin Unfractionated: 0.28 [IU]/mL — ABNORMAL LOW (ref 0.30–0.70)
Heparin Unfractionated: 0.49 [IU]/mL (ref 0.30–0.70)
Heparin Unfractionated: 0.51 [IU]/mL (ref 0.30–0.70)

## 2023-06-22 LAB — MRSA NEXT GEN BY PCR, NASAL: MRSA by PCR Next Gen: NOT DETECTED

## 2023-06-22 MED ORDER — ACETAMINOPHEN 325 MG PO TABS: 650.0000 mg | ORAL_TABLET | ORAL | Status: DC | PRN

## 2023-06-22 MED ORDER — WARFARIN SODIUM 7.5 MG PO TABS
7.5000 mg | ORAL_TABLET | Freq: Once | ORAL | Status: AC
  Administered 2023-06-22: 7.5 mg via ORAL
  Filled 2023-06-22: qty 1

## 2023-06-22 MED ORDER — NORTRIPTYLINE HCL 10 MG PO CAPS
10.0000 mg | ORAL_CAPSULE | Freq: Every day | ORAL | Status: DC
  Administered 2023-06-22 – 2023-06-25 (×4): 10 mg via ORAL
  Filled 2023-06-22 (×5): qty 1

## 2023-06-22 MED ORDER — AMIODARONE HCL IN DEXTROSE 360-4.14 MG/200ML-% IV SOLN
30.0000 mg/h | INTRAVENOUS | Status: DC
  Administered 2023-06-23 (×3): 30 mg/h via INTRAVENOUS
  Filled 2023-06-22 (×3): qty 200

## 2023-06-22 MED ORDER — SODIUM CHLORIDE 0.9% FLUSH: 3.0000 mL | INTRAVENOUS | Status: DC | PRN

## 2023-06-22 MED ORDER — WARFARIN - PHARMACIST DOSING INPATIENT
Freq: Every day | Status: DC
Start: 2023-06-23 — End: 2023-06-26

## 2023-06-22 MED ORDER — HEPARIN (PORCINE) 25000 UT/250ML-% IV SOLN
2050.0000 [IU]/h | INTRAVENOUS | Status: DC
Start: 2023-06-23 — End: 2023-06-24
  Administered 2023-06-23 – 2023-06-24 (×4): 2050 [IU]/h via INTRAVENOUS
  Filled 2023-06-22 (×3): qty 250

## 2023-06-22 MED ORDER — METOPROLOL SUCCINATE ER 100 MG PO TB24: 100.0000 mg | ORAL_TABLET | Freq: Every day | ORAL | Status: DC

## 2023-06-22 MED ORDER — BUPROPION HCL ER (SR) 150 MG PO TB12
150.0000 mg | ORAL_TABLET | Freq: Two times a day (BID) | ORAL | Status: DC
Start: 2023-06-23 — End: 2023-06-26
  Administered 2023-06-22 – 2023-06-26 (×8): 150 mg via ORAL
  Filled 2023-06-22 (×9): qty 1

## 2023-06-22 MED ORDER — POTASSIUM CHLORIDE CRYS ER 20 MEQ PO TBCR
40.0000 meq | EXTENDED_RELEASE_TABLET | ORAL | Status: AC
  Administered 2023-06-22 (×2): 40 meq via ORAL
  Filled 2023-06-22 (×2): qty 2

## 2023-06-22 MED ORDER — SODIUM CHLORIDE 0.9% FLUSH
3.0000 mL | Freq: Two times a day (BID) | INTRAVENOUS | Status: DC
  Administered 2023-06-23 – 2023-06-26 (×6): 3 mL via INTRAVENOUS

## 2023-06-22 MED ORDER — AMIODARONE HCL IN DEXTROSE 360-4.14 MG/200ML-% IV SOLN: 30.0000 mg/h | INTRAVENOUS | Status: DC

## 2023-06-22 MED ORDER — SODIUM CHLORIDE 0.9 % IV SOLN: 250.0000 mL | INTRAVENOUS | Status: AC | PRN

## 2023-06-22 MED ORDER — LISINOPRIL 2.5 MG PO TABS: 2.5000 mg | ORAL_TABLET | Freq: Every day | ORAL | Status: DC

## 2023-06-22 MED ORDER — ALBUTEROL SULFATE (2.5 MG/3ML) 0.083% IN NEBU: 2.5000 mg | INHALATION_SOLUTION | Freq: Four times a day (QID) | RESPIRATORY_TRACT | Status: DC | PRN

## 2023-06-22 MED ORDER — HEPARIN (PORCINE) 25000 UT/250ML-% IV SOLN: 2050.0000 [IU]/h | INTRAVENOUS | Status: DC

## 2023-06-22 MED ORDER — ROSUVASTATIN CALCIUM 20 MG PO TABS
40.0000 mg | ORAL_TABLET | Freq: Every day | ORAL | Status: DC
Start: 2023-06-23 — End: 2023-06-26
  Administered 2023-06-23 – 2023-06-26 (×4): 40 mg via ORAL
  Filled 2023-06-22 (×4): qty 2

## 2023-06-22 MED ORDER — HEPARIN BOLUS VIA INFUSION
1350.0000 [IU] | Freq: Once | INTRAVENOUS | Status: AC
Start: 2023-06-22 — End: 2023-06-22
  Administered 2023-06-22: 1350 [IU] via INTRAVENOUS
  Filled 2023-06-22: qty 1350

## 2023-06-22 MED ORDER — ALLOPURINOL 300 MG PO TABS
300.0000 mg | ORAL_TABLET | Freq: Every day | ORAL | Status: DC
  Administered 2023-06-23 – 2023-06-26 (×4): 300 mg via ORAL
  Filled 2023-06-22 (×4): qty 1

## 2023-06-22 MED ORDER — SPIRONOLACTONE 25 MG PO TABS
25.0000 mg | ORAL_TABLET | Freq: Every day | ORAL | Status: DC
  Administered 2023-06-23 – 2023-06-26 (×4): 25 mg via ORAL
  Filled 2023-06-22 (×4): qty 1

## 2023-06-22 MED ORDER — ASPIRIN 81 MG PO CHEW
81.0000 mg | CHEWABLE_TABLET | Freq: Every day | ORAL | Status: DC
  Administered 2023-06-23 – 2023-06-26 (×4): 81 mg via ORAL
  Filled 2023-06-22 (×4): qty 1

## 2023-06-22 MED ORDER — ONDANSETRON HCL 4 MG/2ML IJ SOLN: 4.0000 mg | Freq: Four times a day (QID) | INTRAMUSCULAR | Status: DC | PRN

## 2023-06-22 MED ORDER — SPIRONOLACTONE 25 MG PO TABS: 25.0000 mg | ORAL_TABLET | Freq: Every day | ORAL | Status: DC

## 2023-06-22 MED ORDER — FUROSEMIDE 10 MG/ML IJ SOLN: 80.0000 mg | Freq: Two times a day (BID) | INTRAMUSCULAR | Status: DC

## 2023-06-22 NOTE — Discharge Summary (Signed)
Physician Discharge Summary  Roberto [name] FUX:[id number] DOB: 04/03/1956 DOA: 06/17/2023  PCP: [name], DO  Admit date: 06/17/2023 Discharge date: 06/22/2023  Admitted From: home  Disposition:  [facility]   Recommendations for Outpatient Follow-up:  Follow up as per physicians at [facility]: no  Equipment/Devices:  Discharge Condition: stable  CODE STATUS: full  Diet recommendation: Heart Healthy / Carb Modified   Brief/Interim Summary: HPI was taken Dr. Arville Care: Roberto [name] is a 67 y.o. Caucasian male with medical history significant for chronic systolic CHF, nonischemic cardiomyopathy,CKD, type 2 diabetes mellitus, bicuspid aortic valve s/p mechanical heart valve replacement and anticoagulation with Coumadin, dyslipidemia, CVA, thoracic aortic aneurysm, and TIA, who presented to the emergency room with acute onset of palpitations which started about an hour before he came to the ER with associated dyspnea without chest pain.  He admitted to lightheadedness and dizziness with this palpitations and denied any loss of consciousness.  No fever or chills.  No nausea or vomiting or abdominal pain.  No cough or wheezing or hemoptysis.   ED Course: When he came to the ER, BP was 132/100 with heart rate of 191 and later 197 and otherwise normal vital signs.  Labs revealed a BUN of 34 and creatinine 2.15 up from previous levels and AST of 183 with ALT 113 and BNP 224.7 with high sensitivity troponin I of 41.  CBC was within normal.  INR was 2.2 and PTT 24.9. EKG as reviewed by me : Initial EKG showed wide QRS tachycardia concerning for ventricular tachycardia with nonspecific intraventricular conduction delay with rate of 191.  Later on repeat EKG showed Atrial fibrillation with a rate of 100 with nonspecific intraventricular conduction delay and nonsustained ventricular tachycardia.  Another repeat KG showed atrial fibrillation with RVR of 102 with nonspecific  intraventricular conduction delay. Imaging: Portable chest x-ray showed cardiomegaly with no acute cardiopulmonary disease.   The patient was given 50 mcg of IV fentanyl, 2 g of IV magnesium sulfate, 40 mill equivalent p.o. potassium chloride and 1 mg of IV Versed to initiate preparing for cardioversion however right before cardioversion this rhythm converted to atrial fibrillation.  He was then started on IV amiodarone with heart rate 50 mg bolus followed by drip.  Dr. Myriam Forehand- Sandie Ano was notified and is aware about the patient.  He will be admitted to a progressive unit bed for further evaluation and management.  Discharge Diagnoses:  Principal Problem:   Sustained ventricular tachycardia (HCC) Active Problems:   Atrial fibrillation with RVR (HCC)   Hypotension   Acute kidney injury superimposed on chronic kidney disease (HCC)   H/O mechanical aortic valve replacement   Type 2 diabetes mellitus with chronic kidney disease, without long-term current use of insulin (HCC)   Chronic systolic CHF (congestive heart failure) (HCC)   Depression   Gout  Acute on chronic systolic CHF: EF 02%  Continue on IV lasix as per cardio. Monitor I/Os. Continue on aldactone. Holding lisinopril. Needs ICD as per cardio Cardio following and recs apprec   Sustained ventricular tachycardia: continue on amio drip. S/p cardiac cath. No stents were placed. Needs ICD placement as per cardio   A. fib: new onset. Continue on tele. Continue on IV heparin & IV amio drip as per cardio. Holding home dose of warfarin while IV heparin drip    Hypotension: resolved   AKI on CKDII: Cr is labile. Avoid nephrotoxic meds    H/O mechanical aortic valve replacement: continue  on IV heparin drip. Holding home dose of warfarin while on IV heparin drip as per cardio  DM2: well controlled, HbA1c 6.3. Continue on SSI w/ accuchecks while inpatient   Depression: severity unknown. Continue on home dose of bupropion    Gout: continue on  allopurinol    Morbidly obese: BMI 45.9. Complicates overall care & prognosis   Discharge Instructions  Discharge Instructions     Diet - low sodium heart healthy   Complete by: As directed    Diet Carb Modified   Complete by: As directed    Discharge instructions   Complete by: As directed    F/u as per physicians at [facility]   Increase activity slowly   Complete by: As directed       Allergies as of 06/22/2023   No Known Allergies      Medication List     PAUSE taking these medications    furosemide 80 MG tablet Wait to take this until: July 04, 2023 Commonly known as: LASIX Take 1 tablet (80 mg total) by mouth daily. You also have another medication with the same name that you may need to continue taking.       STOP taking these medications    cyclobenzaprine 10 MG tablet Commonly known as: FLEXERIL       TAKE these medications    albuterol (2.5 MG/3ML) 0.083% nebulizer solution Commonly known as: PROVENTIL USE 1 VIAL IN NEBULIZER EVERY 6 HOURS AS NEEDED FOR WHEEZING FOR SHORTNESS OF BREATH   Ventolin HFA 108 (90 Base) MCG/ACT inhaler Generic drug: albuterol INHALE 2 PUFFS BY MOUTH EVERY 6 HOURS AS NEEDED FOR WHEEZING OR SHORTNESS OF BREATH   allopurinol 300 MG tablet Commonly known as: ZYLOPRIM Take 1 tablet (300 mg total) by mouth daily.   amiodarone 360-4.14 MG/200ML-% Soln Commonly known as: NEXTERONE PREMIX Inject 30 mg/hr into the vein continuous.   aspirin 81 MG chewable tablet Chew 81 mg by mouth daily.   buPROPion 150 MG 12 hr tablet Commonly known as: Wellbutrin SR Take 1 pill in the AM for 3 days, then increase to 1 pill BID. Pick a day in the 2nd week to quit What changed:  how much to take when to take this   dapagliflozin propanediol 10 MG Tabs tablet Commonly known as: FARXIGA Take 1 tablet (10 mg total) by mouth daily.   fenofibrate 48 MG tablet Commonly known as: TRICOR Take 1 tablet (48 mg total) by mouth  daily.   Fish Oil 1000 MG Caps Take 5,000 mg by mouth daily.   furosemide 10 MG/ML injection Commonly known as: LASIX Inject 8 mLs (80 mg total) into the vein 2 (two) times daily. What changed: Another medication with the same name was paused. Ask your nurse or doctor if you should take this medication.   heparin 24401 UT/250ML infusion Inject 2,050 Units/hr into the vein continuous.   lisinopril 2.5 MG tablet Commonly known as: ZESTRIL Take 1 tablet (2.5 mg total) by mouth daily.   metFORMIN 500 MG 24 hr tablet Commonly known as: GLUCOPHAGE-XR Take 1 tablet (500 mg total) by mouth daily with breakfast.   metoprolol succinate 100 MG 24 hr tablet Commonly known as: TOPROL-XL Take 1 tablet (100 mg total) by mouth daily. What changed: how much to take   nicotine 7 mg/24hr patch Commonly known as: Nicoderm CQ Place 1 patch (7 mg total) onto the skin daily.   nortriptyline 10 MG capsule Commonly known as: PAMELOR Take  1 capsule (10 mg total) by mouth at bedtime.   potassium chloride SA 20 MEQ tablet Commonly known as: KLOR-CON M Take 1 tablet (20 mEq total) by mouth daily.   rosuvastatin 40 MG tablet Commonly known as: CRESTOR Take 1 tablet (40 mg total) by mouth daily.   Semaglutide (1 MG/DOSE) 4 MG/3ML Sopn Inject 1 mg as directed once a week.   spironolactone 25 MG tablet Commonly known as: ALDACTONE Take 1 tablet (25 mg total) by mouth daily. Start taking on: June 23, 2023   Trelegy Ellipta 100-62.5-25 MCG/ACT Aepb Generic drug: Fluticasone-Umeclidin-Vilant Inhale 1 puff by mouth once daily   warfarin 4 MG tablet Commonly known as: COUMADIN Take as directed. If you are unsure how to take this medication, talk to your nurse or doctor. Original instructions: TAKE 1 TABLET BY MOUTH ONCE DAILY AT  6PM  AS  DIRECTED   warfarin 7.5 MG tablet Commonly known as: COUMADIN Take as directed. If you are unsure how to take this medication, talk to your nurse or  doctor. Original instructions: TAKE 4MG  BY MOUTH FOR 2 DAYS, THEN TAKE 7.5MG  ON THE THIRD DAY, THEN REPEAT        No Known Allergies  Consultations: Cardio    Procedures/Studies: CARDIAC CATHETERIZATION Result Date: 06/20/2023 Conclusions: Mild to moderate, non-obstructive coronary artery disease with 60% D1 stenosis and 20% mid LAD and proximal RCA lesions.  No significant change noted from prior angiogram in 2020. Moderately elevated left heart filling pressure (PCWP 30 mmHg with prominent v-waves). Severely elevated right heart filling pressure (RA 25, RVEDP 18 mmHg). Moderate pulmonary hypertension (PA 50/28, mean 35 mmHg). Moderately reduced Fick cardiac output/index (CO 4.4 L/min, CI 1.9 L/min/m^2). Recommendations: Resume diuresis.  If renal function worsens and/or adequate diuresis cannot be achieved, inotropic therapy may need to be considered. Reduce metoprolol succinate in the setting of low-output heart failure. Consult advanced heart failure and electrophysiology teams to assist with management of acute on chronic HFrEF and sustained ventricular tachycardia. Resume heparin infusion 2 hours after TR band has been removed; transition back to warfarin once it is clear that no further invasive procedures will be needed this admission. Yvonne Kendall, MD [facility]  ECHOCARDIOGRAM COMPLETE Result Date: 06/20/2023    ECHOCARDIOGRAM REPORT   Patient Name:   Roberto [name] Date of Exam: 06/20/2023 Medical Rec #:  [id number]       Height:       66.0 in Accession #:    [id number]      Weight:       284.8 lb Date of Birth:  Nov 12, 1955       BSA:          2.324 m Patient Age:    67 years        BP:           118/78 mmHg Patient Gender: M               HR:           80 bpm. Exam Location:  [facility] Procedure: 2D Echo, Cardiac Doppler, Color Doppler and Intracardiac            Opacification Agent Indications:     Atrial Fibrillation  History:         Patient has prior history of Echocardiogram  examinations, most                  recent 04/03/2022. CHF and Cardiomyopathy, CAD, COPD and TIA,  Aortic Valve Disease, Arrythmias:Atrial Fibrillation and                  Tachycardia; Risk Factors:Hypertension, Diabetes, Dyslipidemia                  and Former Smoker. CKD, There is a 27mm Carbometrics mechanical                  valve in the Aortic position.  Sonographer:     Mikki Harbor Referring Phys:  [id number] CADENCE H FURTH Diagnosing Phys: Julien Nordmann MD  Sonographer Comments: Technically difficult study due to poor echo windows and patient is obese. IMPRESSIONS  1. Left ventricular ejection fraction, by estimation, is 20 to 25%. The left ventricle has severely decreased function. The left ventricle demonstrates global hypokinesis. The left ventricular internal cavity size was mildly dilated. There is mild left ventricular hypertrophy. Left ventricular diastolic parameters are indeterminate.  2. Right ventricular systolic function is moderately reduced. The right ventricular size is normal. There is mildly elevated pulmonary artery systolic pressure. The estimated right ventricular systolic pressure is 39.6 mmHg.  3. Left atrial size was moderately dilated.  4. The mitral valve is normal in structure. Mild mitral valve regurgitation. No evidence of mitral stenosis.  5. Tricuspid valve regurgitation is moderate to severe.  6. The aortic valve has been repaired/replaced. There is a 27 mm Carbometrics mechanical valve present. Aortic valve regurgitation is not visualized. No aortic stenosis is present. Aortic valve mean gradient measures 8.0 mmHg.  7. There is mild dilatation of the aortic arch, measuring 43 mm.  8. The inferior vena cava is dilated in size with <50% respiratory variability, suggesting right atrial pressure of 15 mmHg. FINDINGS  Left Ventricle: Left ventricular ejection fraction, by estimation, is 20 to 25%. The left ventricle has severely decreased function. The left  ventricle demonstrates global hypokinesis. Definity contrast agent was given IV to delineate the left ventricular endocardial borders. The left ventricular internal cavity size was mildly dilated. There is mild left ventricular hypertrophy. Left ventricular diastolic parameters are indeterminate. Right Ventricle: The right ventricular size is normal. No increase in right ventricular wall thickness. Right ventricular systolic function is moderately reduced. There is mildly elevated pulmonary artery systolic pressure. The tricuspid regurgitant velocity is 2.48 m/s, and with an assumed right atrial pressure of 15 mmHg, the estimated right ventricular systolic pressure is 39.6 mmHg. Left Atrium: Left atrial size was moderately dilated. Right Atrium: Right atrial size was normal in size. Pericardium: There is no evidence of pericardial effusion. Mitral Valve: The mitral valve is normal in structure. Mild mitral valve regurgitation. No evidence of mitral valve stenosis. MV peak gradient, 8.9 mmHg. The mean mitral valve gradient is 4.0 mmHg. Tricuspid Valve: The tricuspid valve is normal in structure. Tricuspid valve regurgitation is moderate to severe. No evidence of tricuspid stenosis. Aortic Valve: The aortic valve has been repaired/replaced. Aortic valve regurgitation is not visualized. No aortic stenosis is present. Aortic valve mean gradient measures 8.0 mmHg. Aortic valve peak gradient measures 13.7 mmHg. Aortic valve area, by VTI  measures 1.12 cm. There is a mechanical valve present in the aortic position. Pulmonic Valve: The pulmonic valve was normal in structure. Pulmonic valve regurgitation is not visualized. No evidence of pulmonic stenosis. Aorta: The aortic root is normal in size and structure. There is mild dilatation of the aortic arch, measuring 43 mm. Venous: The inferior vena cava is dilated in size with less than 50% respiratory variability, suggesting  right atrial pressure of 15 mmHg. IAS/Shunts: No  atrial level shunt detected by color flow Doppler.  LEFT VENTRICLE PLAX 2D LVIDd:         5.30 cm LVIDs:         5.00 cm LV PW:         1.40 cm LV IVS:        1.30 cm LVOT diam:     2.00 cm LV SV:         40 LV SV Index:   17 LVOT Area:     3.14 cm  RIGHT VENTRICLE RV Basal diam:  4.90 cm RV Mid diam:    3.50 cm RV S prime:     7.87 cm/s LEFT ATRIUM             Index        RIGHT ATRIUM           Index LA diam:        6.00 cm 2.58 cm/m   RA Area:     41.90 cm LA Vol (A2C):   99.2 ml 42.68 ml/m  RA Volume:   174.00 ml 74.87 ml/m LA Vol (A4C):   69.0 ml 29.69 ml/m LA Biplane Vol: 82.7 ml 35.58 ml/m  AORTIC VALVE                     PULMONIC VALVE AV Area (Vmax):    1.13 cm      PV Vmax:       1.19 m/s AV Area (Vmean):   1.00 cm      PV Peak grad:  5.7 mmHg AV Area (VTI):     1.12 cm AV Vmax:           185.33 cm/s AV Vmean:          133.667 cm/s AV VTI:            0.360 m AV Peak Grad:      13.7 mmHg AV Mean Grad:      8.0 mmHg LVOT Vmax:         66.73 cm/s LVOT Vmean:        42.433 cm/s LVOT VTI:          0.128 m LVOT/AV VTI ratio: 0.36  AORTA Ao Root diam: 3.40 cm MITRAL VALVE                TRICUSPID VALVE MV Area (PHT): 3.81 cm     TR Peak grad:   24.6 mmHg MV Area VTI:   1.60 cm     TR Vmax:        248.00 cm/s MV Peak grad:  8.9 mmHg MV Mean grad:  4.0 mmHg     SHUNTS MV Vmax:       1.49 m/s     Systemic VTI:  0.13 m MV Vmean:      91.7 cm/s    Systemic Diam: 2.00 cm MV Decel Time: 199 msec MV E velocity: 140.00 cm/s Julien Nordmann MD Electronically signed by Julien Nordmann MD Signature Date/Time: 06/20/2023/2:26:04 PM    Final    DG Chest Portable 1 View Result Date: 06/17/2023 CLINICAL DATA:  Shortness of breath EXAM: PORTABLE CHEST 1 VIEW COMPARISON:  Chest x-ray 11/04/2018 FINDINGS: The heart is enlarged. The lungs are clear. There is no pleural effusion or pneumothorax. No acute fractures are seen. Sternotomy wires are present. IMPRESSION: Cardiomegaly. No acute cardiopulmonary process.  Electronically Signed  By: Darliss Cheney M.D.   On: 06/17/2023 21:56   (Echo, Carotid, EGD, Colonoscopy, ERCP)    Subjective: Pt c/o fatigue    Discharge Exam: Vitals:   06/22/23 0356 06/22/23 1122  BP: 108/78 (!) 130/94  Pulse: 67 71  Resp: 19 (!) 21  Temp: 97.9 F (36.6 C) 97.7 F (36.5 C)  SpO2: 100% 98%   Vitals:   06/21/23 2328 06/22/23 0356 06/22/23 0500 06/22/23 1122  BP: 111/71 108/78  (!) 130/94  Pulse: 74 67  71  Resp: 17 19  (!) 21  Temp: 98.2 F (36.8 C) 97.9 F (36.6 C)  97.7 F (36.5 C)  TempSrc:    Oral  SpO2: 98% 100%  98%  Weight:   123.8 kg   Height:        General: Pt is alert, awake, not in acute distress Cardiovascular: S1/S2+, no rubs, no gallops Respiratory: decreased breath sounds b/l Abdominal: Soft, NT,obese, bowel sounds + Extremities: b/l LE, no cyanosis    The results of significant diagnostics from this hospitalization (including imaging, microbiology, ancillary and laboratory) are listed below for reference.     Microbiology: No results found for this or any previous visit (from the past 240 hours).   Labs: BNP (last 3 results) Recent Labs    06/17/23 2110  BNP 224.7*   Basic Metabolic Panel: Recent Labs  Lab 06/17/23 2110 06/18/23 0635 06/18/23 0936 06/19/23 0322 06/20/23 0437 06/20/23 1324 06/20/23 1328 06/21/23 0608 06/22/23 0508  NA 137 139  --  138 141 142 143 137 138  K 3.8 3.8  --  3.6 4.2 4.3 4.2 3.6 3.8  CL 103 105  --  109 107  --   --  107 102  CO2 26 26  --  21* 24  --   --  20* 24  GLUCOSE 203* 106*  --  109* 114*  --   --  106* 104*  BUN 34* 35*  --  29* 21  --   --  22 27*  CREATININE 2.15* 1.95*  --  1.53* 1.21  --   --  1.39* 1.53*  CALCIUM 9.2 8.5*  --  8.2* 8.9  --   --  8.7* 9.2  MG 2.2  --  2.8*  --   --   --   --  2.5*  --    Liver Function Tests: Recent Labs  Lab 06/17/23 2110  AST 183*  ALT 113*  ALKPHOS 52  BILITOT 0.8  PROT 7.2  ALBUMIN 4.0   No results for input(s):  "LIPASE", "AMYLASE" in the last 168 hours. No results for input(s): "AMMONIA" in the last 168 hours. CBC: Recent Labs  Lab 06/17/23 2110 06/18/23 0635 06/19/23 0322 06/20/23 0437 06/20/23 1324 06/20/23 1328 06/21/23 0608 06/22/23 0508  WBC 9.6 7.9 8.6 9.0  --   --  9.3 8.8  NEUTROABS 6.4  --   --   --   --   --   --   --   HGB 14.9 12.7* 12.9* 13.8 12.6* 12.6* 12.5* 12.4*  HCT 45.8 39.3 39.3 43.3 37.0* 37.0* 37.8* 38.4*  MCV 86.1 86.2 85.1 88.2  --   --  85.7 86.3  PLT 255 191 200 215  --   --  186 190   Cardiac Enzymes: No results for input(s): "CKTOTAL", "CKMB", "CKMBINDEX", "TROPONINI" in the last 168 hours. BNP: Invalid input(s): "POCBNP" CBG: Recent Labs  Lab 06/20/23 2138 06/21/23 0808 06/21/23 1638 06/21/23  2149 06/22/23 1224  GLUCAP 107* 157* 112* 102* 127*   D-Dimer No results for input(s): "DDIMER" in the last 72 hours. Hgb A1c No results for input(s): "HGBA1C" in the last 72 hours. Lipid Profile No results for input(s): "CHOL", "HDL", "LDLCALC", "TRIG", "CHOLHDL", "LDLDIRECT" in the last 72 hours. Thyroid function studies No results for input(s): "TSH", "T4TOTAL", "T3FREE", "THYROIDAB" in the last 72 hours.  Invalid input(s): "FREET3" Anemia work up No results for input(s): "VITAMINB12", "FOLATE", "FERRITIN", "TIBC", "IRON", "RETICCTPCT" in the last 72 hours. Urinalysis    Component Value Date/Time   COLORURINE YELLOW (A) 07/27/2017 2133   APPEARANCEUR Clear 05/16/2023 0849   LABSPEC 1.024 07/27/2017 2133   LABSPEC 1.005 05/24/2014 1157   PHURINE 5.0 07/27/2017 2133   GLUCOSEU 2+ (A) 05/16/2023 0849   GLUCOSEU Negative 05/24/2014 1157   HGBUR NEGATIVE 07/27/2017 2133   BILIRUBINUR Negative 05/16/2023 0849   BILIRUBINUR Negative 05/24/2014 1157   KETONESUR NEGATIVE 07/27/2017 2133   PROTEINUR Negative 05/16/2023 0849   PROTEINUR NEGATIVE 07/27/2017 2133   NITRITE Negative 05/16/2023 0849   NITRITE NEGATIVE 07/27/2017 2133   LEUKOCYTESUR  Negative 05/16/2023 0849   LEUKOCYTESUR Negative 05/24/2014 1157   Sepsis Labs Recent Labs  Lab 06/19/23 0322 06/20/23 0437 06/21/23 0608 06/22/23 0508  WBC 8.6 9.0 9.3 8.8   Microbiology No results found for this or any previous visit (from the past 240 hours).   Time coordinating discharge: Over 30 minutes  SIGNED:   Charise Killian, MD  [facility] 06/22/2023, 4:04 PM Pager   If 7PM-7AM, please contact night-coverage [email or web address]

## 2023-06-22 NOTE — Progress Notes (Signed)
Telemetry reviewed - remains in AFib, no further VT.   Patient will need to be transferred to [facility] for ICD placement prior to discharge d/t EP lab availability   Sherie Don, NP  06/22/23 12:14 PM

## 2023-06-22 NOTE — Progress Notes (Signed)
[facility] awake and alert.

## 2023-06-22 NOTE — H&P (Incomplete)
Advanced Heart Failure Team History and Physical Note   PCP:  [name], DO  PCP-Cardiology: Yvonne Kendall, MD     Reason for Admission: Acute on chronic systolic CHF, VT   HPI:   67 y.o. male with history of bicuspid aortic valve and aortic stenosis s/p mechanical AVR in 2011, ascending aortic aneurysm followed by TCTS, chronic systolic heart failure, CAD, OSA, morbid obesity, hx of renal infarct, prior CVA.  Echo in 2020: LVEF 40-45%, stable mechanical aortic vavle  R/LHC 2020: nonobstructive CAD and R>L sided failure.  Echo 10/23: EF 35-40%, RV mildly reduced, stable mechanical aortic valve  Presented to the ED 06/17/23 with wide complex tachycardia c/w VT and acute on chronic systolic CHF. He was hypotensive with plans for emergent cardioversion but converted to atrial fibrillation w/ IV amiodarone. R/LHC with nonobstructive CAD, severely elevated filling pressures, Fick CI 1.9. Echo with LVEF 25%, RV okay, severe TR.  Advanced Heart Failure consulted. Has been diuresing with IV lasix. Seen by EP for VT and ICD placement recommended during this admission once HF optimized. He is being transferred to [facility] for device implant.  Home Medications Prior to Admission medications   Medication Sig Start Date End Date Taking? Authorizing Provider  albuterol (PROVENTIL) (2.5 MG/3ML) 0.083% nebulizer solution USE 1 VIAL IN NEBULIZER EVERY 6 HOURS AS NEEDED FOR WHEEZING FOR SHORTNESS OF BREATH 06/07/22   [name], [name], DO  allopurinol (ZYLOPRIM) 300 MG tablet Take 1 tablet (300 mg total) by mouth daily. 05/16/23   Olevia Perches P, DO  aspirin 81 MG chewable tablet Chew 81 mg by mouth daily.    [provider]  buPROPion (WELLBUTRIN SR) 150 MG 12 hr tablet Take 1 pill in the AM for 3 days, then increase to 1 pill BID. Pick a day in the 2nd week to quit Patient taking differently: 150 mg 2 (two) times daily. Take 1 pill in the AM for 3 days, then increase to 1 pill BID.  Pick a day in the 2nd week to quit 04/10/23   Olevia Perches P, DO  cyclobenzaprine (FLEXERIL) 10 MG tablet Take 1 tablet (10 mg total) by mouth 3 times/day as needed-between meals & bedtime for muscle spasms. Patient not taking: Reported on 06/18/2023 12/10/18   Olevia Perches P, DO  dapagliflozin propanediol (FARXIGA) 10 MG TABS tablet Take 1 tablet (10 mg total) by mouth daily. 04/06/23   End, Cristal Deer, MD  fenofibrate (TRICOR) 48 MG tablet Take 1 tablet (48 mg total) by mouth daily. 12/25/22   End, Cristal Deer, MD  furosemide (LASIX) 80 MG tablet Take 1 tablet (80 mg total) by mouth daily. 04/06/23   End, Cristal Deer, MD  lisinopril (ZESTRIL) 2.5 MG tablet Take 1 tablet (2.5 mg total) by mouth daily. 04/06/23   End, Cristal Deer, MD  metFORMIN (GLUCOPHAGE-XR) 500 MG 24 hr tablet Take 1 tablet (500 mg total) by mouth daily with breakfast. 05/16/23   Olevia Perches P, DO  metoprolol succinate (TOPROL-XL) 100 MG 24 hr tablet Take 1 tablet (100 mg total) by mouth daily. Patient taking differently: Take 50 mg by mouth daily. 04/06/23   End, Cristal Deer, MD  nicotine (NICODERM CQ) 7 mg/24hr patch Place 1 patch (7 mg total) onto the skin daily. 04/10/23   [name], [name], DO  nortriptyline (PAMELOR) 10 MG capsule Take 1 capsule (10 mg total) by mouth at bedtime. 05/16/23   [name], [name], DO  Omega-3 Fatty Acids (FISH OIL) 1000 MG CAPS Take 5,000  mg by mouth daily. Patient not taking: Reported on 06/18/2023    [provider]  potassium chloride SA (KLOR-CON M) 20 MEQ tablet Take 1 tablet (20 mEq total) by mouth daily. 04/06/23   End, Cristal Deer, MD  rosuvastatin (CRESTOR) 40 MG tablet Take 1 tablet (40 mg total) by mouth daily. 04/06/23   End, Cristal Deer, MD  Semaglutide, 1 MG/DOSE, 4 MG/3ML SOPN Inject 1 mg as directed once a week. 05/16/23   [name], [name], DO  TRELEGY ELLIPTA 100-62.5-25 MCG/ACT AEPB Inhale 1 puff by mouth once daily 05/16/23   Laural Benes, [name], DO  VENTOLIN HFA 108 (90  Base) MCG/ACT inhaler INHALE 2 PUFFS BY MOUTH EVERY 6 HOURS AS NEEDED FOR WHEEZING OR SHORTNESS OF BREATH 05/16/23   [name], [name], DO  warfarin (COUMADIN) 4 MG tablet TAKE 1 TABLET BY MOUTH ONCE DAILY AT  6PM  AS  DIRECTED 05/16/23   Olevia Perches P, DO  warfarin (COUMADIN) 7.5 MG tablet TAKE 4MG  BY MOUTH FOR 2 DAYS, THEN TAKE 7.5MG  ON THE THIRD DAY, THEN REPEAT 05/16/23   [name], DO    Past Medical History: Past Medical History:  Diagnosis Date   Bicuspid aortic valve    a. s/p #27 [employer] mechanical valve on 03/25/2010; b. on Coumadin; c. TTE 12/17: EF 40-45%, moderately dilated LV with moderate LVH, AVR well-seated with 14 mmHg gradient, peak AV velocity 2.5 m/s, mild mitral valve thickening with mild MR, mildly dilated RV with mildly reduced contraction   Cellulitis    Chronic kidney disease    Chronic systolic CHF (congestive heart failure) (HCC)    a. R/LHC 03/2010 showed no significant CAD, LVEDP 31 mmHg, mean AoV gradient 34 mmHg at rest and 47 mmHg with dobutamine 20 mcg/kg/min, AVA 1.0 cm^2, RA 31, RV 68/25, PA 68/47, PCWP 38. PA sat 65%. CO 6.2 L/min (Fick) and 5.3 L/min (thermodilution)   Clotting disorder (HCC)    H/O mechanical aortic valve replacement 03/25/2010   a. #27 [employer] mechanical valve   Hypercholesterolemia    Renal infarct [facility]) 2017   Multiple right renal infarcts, likely embolic.   Stroke [facility])    TIA (transient ischemic attack) 05/2014    Past Surgical History: Past Surgical History:  Procedure Laterality Date   AORTIC VALVE REPLACEMENT     CARDIAC CATHETERIZATION  03/21/2010   No significant CAD. Severe aortic stenosis. Severely elevated left and right heart filling pressures.   CARDIAC SURGERY  2009   CHF   CARPAL TUNNEL RELEASE Left 2005   RIGHT HEART CATH AND CORONARY ANGIOGRAPHY N/A 01/28/2019   Procedure: RIGHT HEART CATH AND CORONARY ANGIOGRAPHY;  Surgeon: Yvonne Kendall, MD;  Location: [facility] INVASIVE CV LAB;  Service:  Cardiovascular;  Laterality: N/A;   RIGHT HEART CATH AND CORONARY ANGIOGRAPHY N/A 06/20/2023   Procedure: RIGHT HEART CATH AND CORONARY ANGIOGRAPHY;  Surgeon: Yvonne Kendall, MD;  Location: [facility] INVASIVE CV LAB;  Service: Cardiovascular;  Laterality: N/A;   TONSILLECTOMY  1962    Family History: *** Family History  Problem Relation Age of Onset   Arthritis Mother    Dementia Mother    Colon cancer Mother    Arthritis Father    Diabetes Father    Stroke Father    Colon cancer Father    Heart attack Brother    Breast cancer Sister    Seizures Sister    Cancer Brother        brain   Heart disease Brother  Heart attack Brother     Social History: Social History   Socioeconomic History   Marital status: Single    Spouse name: Not on file   Number of children: 1   Years of education: 4   Highest education level: Associate degree: academic program  Occupational History   Occupation: Disabled    Employer: UNEMPLOYED  Tobacco Use   Smoking status: Former    Current packs/day: 0.00    Average packs/day: 0.5 packs/day for 46.0 years (23.0 ttl pk-yrs)    Types: Cigarettes    Start date: 07/16/2017    Quit date: 05/11/2019    Years since quitting: 4.1   Smokeless tobacco: Never  Vaping Use   Vaping status: Never Used  Substance and Sexual Activity   Alcohol use: No    Alcohol/week: 0.0 standard drinks of alcohol   Drug use: No   Sexual activity: Not Currently  Other Topics Concern   Not on file  Social History Narrative   Not on file   Social Drivers of Health   Financial Resource Strain: Low Risk  (08/21/2022)   Overall Financial Resource Strain (CARDIA)    Difficulty of Paying Living Expenses: Not very hard  Food Insecurity: No Food Insecurity (06/20/2023)   Hunger Vital Sign    Worried About Running Out of Food in the Last Year: Never true    Ran Out of Food in the Last Year: Never true  Transportation Needs: No Transportation Needs (06/20/2023)   PRAPARE -  Administrator, Civil Service (Medical): No    Lack of Transportation (Non-Medical): No  Physical Activity: Inactive (08/21/2022)   Exercise Vital Sign    Days of Exercise per Week: 0 days    Minutes of Exercise per Session: 0 min  Stress: No Stress Concern Present (08/21/2022)   [employer] of Occupational Health - Occupational Stress Questionnaire    Feeling of Stress : Only a little  Social Connections: Socially Isolated (08/21/2022)   Social Connection and Isolation Panel [NHANES]    Frequency of Communication with Friends and Family: More than three times a week    Frequency of Social Gatherings with Friends and Family: Never    Attends Religious Services: Never    Diplomatic Services operational officer: No    Attends Engineer, structural: Never    Marital Status: Divorced    Allergies:  No Known Allergies  Objective:    Vital Signs:   Temp:  [97.7 F (36.5 C)-98.3 F (36.8 C)] 97.7 F (36.5 C) (12/20 1122) Pulse Rate:  [67-82] 71 (12/20 1122) Resp:  [16-21] 21 (12/20 1122) BP: (108-130)/(71-94) 130/94 (12/20 1122) SpO2:  [98 %-100 %] 98 % (12/20 1122) Weight:  [123.8 kg] 123.8 kg (12/20 0500)   There were no vitals filed for this visit.   Physical Exam     General:  Well appearing. No respiratory difficulty HEENT: Normal Neck: Supple. no JVD. Carotids 2+ bilat; no bruits. No lymphadenopathy or thyromegaly appreciated. Cor: PMI nondisplaced. Regular rate & rhythm. No rubs, gallops or murmurs. Lungs: Clear Abdomen: Soft, nontender, nondistended. No hepatosplenomegaly. No bruits or masses. Good bowel sounds. Extremities: No cyanosis, clubbing, rash, edema Neuro: Alert & oriented x 3, cranial nerves grossly intact. moves all 4 extremities w/o difficulty. Affect pleasant.   Telemetry   ***  EKG   ***  Labs     Basic Metabolic Panel: Recent Labs  Lab 06/17/23 2110 06/18/23 1610 06/18/23 0936 06/19/23 0322 06/20/23  6045  06/20/23 1324 06/20/23 1328 06/21/23 0608 06/22/23 0508  NA 137 139  --  138 141 142 143 137 138  K 3.8 3.8  --  3.6 4.2 4.3 4.2 3.6 3.8  CL 103 105  --  109 107  --   --  107 102  CO2 26 26  --  21* 24  --   --  20* 24  GLUCOSE 203* 106*  --  109* 114*  --   --  106* 104*  BUN 34* 35*  --  29* 21  --   --  22 27*  CREATININE 2.15* 1.95*  --  1.53* 1.21  --   --  1.39* 1.53*  CALCIUM 9.2 8.5*  --  8.2* 8.9  --   --  8.7* 9.2  MG 2.2  --  2.8*  --   --   --   --  2.5*  --     Liver Function Tests: Recent Labs  Lab 06/17/23 2110  AST 183*  ALT 113*  ALKPHOS 52  BILITOT 0.8  PROT 7.2  ALBUMIN 4.0   No results for input(s): "LIPASE", "AMYLASE" in the last 168 hours. No results for input(s): "AMMONIA" in the last 168 hours.  CBC: Recent Labs  Lab 06/17/23 2110 06/18/23 0635 06/19/23 0322 06/20/23 0437 06/20/23 1324 06/20/23 1328 06/21/23 0608 06/22/23 0508  WBC 9.6 7.9 8.6 9.0  --   --  9.3 8.8  NEUTROABS 6.4  --   --   --   --   --   --   --   HGB 14.9 12.7* 12.9* 13.8 12.6* 12.6* 12.5* 12.4*  HCT 45.8 39.3 39.3 43.3 37.0* 37.0* 37.8* 38.4*  MCV 86.1 86.2 85.1 88.2  --   --  85.7 86.3  PLT 255 191 200 215  --   --  186 190    Cardiac Enzymes: No results for input(s): "CKTOTAL", "CKMB", "CKMBINDEX", "TROPONINI" in the last 168 hours.  BNP: BNP (last 3 results) Recent Labs    06/17/23 2110  BNP 224.7*    ProBNP (last 3 results) No results for input(s): "PROBNP" in the last 8760 hours.   CBG: Recent Labs  Lab 06/20/23 2138 06/21/23 0808 06/21/23 1638 06/21/23 2149 06/22/23 1224  GLUCAP 107* 157* 112* 102* 127*    Coagulation Studies: Recent Labs    06/20/23 0437 06/21/23 0608 06/22/23 0508  LABPROT 23.1* 21.0* 18.8*  INR 2.0* 1.8* 1.6*    Imaging: No results found.   Patient Profile  67 y.o. male with history of chronic systolic CHF/NICM, bicuspid aortic valve with aortic valve stenosis s/p mechanical AVR, ascending aortic aneurysm,  DM II, tobacco use, morbid obesity. Admitted with ventricular tachycardia and acute on chronic systolic CHF.  Assessment/Plan  Acute on chronic systolic heart failure  - NICM - Echocardiogram with LVEF 25% and preserved RV function in the setting of volume overload. Severe TR.  - R/LHC with nonobstructive CAD, severely elevated filling pressures, Fick CI 1.9. - Cardiac MRI this admit to evaluate for scar burden / LGE; apex appears to be thin and akinetic on contrasted echo imaging.   - Remains volume overloaded Continue IV lasix 80 BID.  - Continue Farxiga 10 mg daily - Continue spiro 25 mg daily - No beta blocker with low-output - Based on recent VT and worsening of LVEF, suspect Roberto [name] may require advanced therapies in the future; high risk due to aortic aneurysm. His only support at this time  is his girlfriend.    Ventricular tachycardia - Likely secondary to decompensated heart failure with severely elevated filling pressures and reduced cardiac index.  - Continue amiodarone gtt - EP following. Plan for ICD placement early next week once CHF optimized.   Mechanical AVR - [employer] 27mm valve placed in 2011 for bicuspid valve disease - Valve stable on echo - Continue heparin gtt - Continue warfarin, INR 1.6. Will need therapeutic INR prior to placement of ICD.   Atrial fibrillation - New diagnosis - TSH WNL - On Heparin drip and warfarin as above.    5. Tobacco use - Reports that he has started smoking again.  - Nicotine patch   6. Aortic aneurysm - Followed by Dr. Laneta Simmers  - 5.7cm fusiform ascending aortic aneurysm; high risk for re-do surgery. High risk for advanced therapies.     Wylene Weissman N, [name] 06/22/2023, 3:25 PM  Advanced Heart Failure Team Pager [phone] (M-F; 7a - 5p)  Please contact [facility] for night-coverage after hours (4p -7a ) and weekends on [email or web address]

## 2023-06-22 NOTE — H&P (Signed)
Cardiology Admission History and Physical   Patient ID: Roberto [name] MRN: [id number]; DOB: 12-20-1955   Admission date: 06/22/2023  PCP:  [name], DO   [facility] Providers Cardiologist:  Yvonne Kendall, MD  Sleep Medicine:  Armanda Magic, MD       Chief Complaint:  VT  Patient Profile:   Roberto [name] is a 67 y.o. male with AS s/p 27mm carbomerics mechanical AVR in 2016, non-obstrurctive CAD, TIA who is being seen 06/22/2023 for the evaluation of ventricular tachycardia.  History of Present Illness:   Roberto [name] was transferred from [facility] for ICD placement in the setting of sustained VT.   Briefly, Roberto [name] presented to the [facility] ED on 12/15 with lightheaded and dizziness and was found to be in sustained ventricular tachycardia. He underwent synchronized cardioversion in the ER. His VT was thought to be driven by congestive heart failure. He was evaluated by Cardiology and started on an IV amiodarone infusion. Since then, his rhythm was noted to be atrial fibrillation. Coronary angiography showed mild to moderate non-obstructive disease. Right heart catheterization showed RA 25, RVEDP 18, PCWP 30, CI 1.9, Since the infusion, he has not had further episodes of VT. His most recent ECG today prior to transfer showed AF.   He is doing well tonight. Denies pain. Most vitals include HR 100, BP 117/83, SpO2 100% RA. Labs this Am include WBC 8.8, Hgb 12.4, cr 1.53, INR 1.6, K 3.8   Past Medical History:  Diagnosis Date   Bicuspid aortic valve    a. s/p #27 Carbomedics mechanical valve on 03/25/2010; b. on Coumadin; c. TTE 12/17: EF 40-45%, moderately dilated LV with moderate LVH, AVR well-seated with 14 mmHg gradient, peak AV velocity 2.5 m/s, mild mitral valve thickening with mild MR, mildly dilated RV with mildly reduced contraction   Cellulitis    Chronic kidney disease    Chronic systolic CHF (congestive heart failure) (HCC)    a. R/LHC 03/2010 showed no  significant CAD, LVEDP 31 mmHg, mean AoV gradient 34 mmHg at rest and 47 mmHg with dobutamine 20 mcg/kg/min, AVA 1.0 cm^2, RA 31, RV 68/25, PA 68/47, PCWP 38. PA sat 65%. CO 6.2 L/min (Fick) and 5.3 L/min (thermodilution)   Clotting disorder (HCC)    H/O mechanical aortic valve replacement 03/25/2010   a. #27 Carbomedics mechanical valve   Hypercholesterolemia    Renal infarct [facility]) 2017   Multiple right renal infarcts, likely embolic.   Stroke [facility])    TIA (transient ischemic attack) 05/2014    Past Surgical History:  Procedure Laterality Date   AORTIC VALVE REPLACEMENT     CARDIAC CATHETERIZATION  03/21/2010   No significant CAD. Severe aortic stenosis. Severely elevated left and right heart filling pressures.   CARDIAC SURGERY  2009   CHF   CARPAL TUNNEL RELEASE Left 2005   RIGHT HEART CATH AND CORONARY ANGIOGRAPHY N/A 01/28/2019   Procedure: RIGHT HEART CATH AND CORONARY ANGIOGRAPHY;  Surgeon: Yvonne Kendall, MD;  Location: [facility] INVASIVE CV LAB;  Service: Cardiovascular;  Laterality: N/A;   RIGHT HEART CATH AND CORONARY ANGIOGRAPHY N/A 06/20/2023   Procedure: RIGHT HEART CATH AND CORONARY ANGIOGRAPHY;  Surgeon: Yvonne Kendall, MD;  Location: [facility] INVASIVE CV LAB;  Service: Cardiovascular;  Laterality: N/A;   TONSILLECTOMY  1962     Medications Prior to Admission: Prior to Admission medications   Medication Sig Start Date End Date Taking? Authorizing Provider  allopurinol (ZYLOPRIM) 300 MG tablet Take 1 tablet (300 mg  total) by mouth daily. 05/16/23  Yes [name], [name], DO  aspirin 81 MG chewable tablet Chew 81 mg by mouth daily.   Yes [provider]  buPROPion (WELLBUTRIN SR) 150 MG 12 hr tablet Take 1 pill in the AM for 3 days, then increase to 1 pill BID. Pick a day in the 2nd week to quit Patient taking differently: 150 mg 2 (two) times daily. Take 1 pill in the AM for 3 days, then increase to 1 pill BID. Pick a day in the 2nd week to quit 04/10/23  Yes [name],  [name], DO  dapagliflozin propanediol (FARXIGA) 10 MG TABS tablet Take 1 tablet (10 mg total) by mouth daily. 04/06/23  Yes End, Cristal Deer, MD  fenofibrate (TRICOR) 48 MG tablet Take 1 tablet (48 mg total) by mouth daily. 12/25/22  Yes End, Cristal Deer, MD  furosemide (LASIX) 80 MG tablet Take 1 tablet (80 mg total) by mouth daily. 04/06/23  Yes End, Cristal Deer, MD  lisinopril (ZESTRIL) 2.5 MG tablet Take 1 tablet (2.5 mg total) by mouth daily. 04/06/23  Yes End, Cristal Deer, MD  metFORMIN (GLUCOPHAGE-XR) 500 MG 24 hr tablet Take 1 tablet (500 mg total) by mouth daily with breakfast. 05/16/23  Yes [name], [name], DO  metoprolol succinate (TOPROL-XL) 100 MG 24 hr tablet Take 1 tablet (100 mg total) by mouth daily. Patient taking differently: Take 50 mg by mouth daily. 04/06/23  Yes End, Cristal Deer, MD  nicotine (NICODERM CQ) 7 mg/24hr patch Place 1 patch (7 mg total) onto the skin daily. 04/10/23  Yes [name], [name], DO  nortriptyline (PAMELOR) 10 MG capsule Take 1 capsule (10 mg total) by mouth at bedtime. 05/16/23  Yes [name], [name], DO  Omega-3 Fatty Acids (FISH OIL) 1000 MG CAPS Take 5,000 mg by mouth daily.   Yes [provider]  potassium chloride SA (KLOR-CON M) 20 MEQ tablet Take 1 tablet (20 mEq total) by mouth daily. 04/06/23  Yes End, Cristal Deer, MD  rosuvastatin (CRESTOR) 40 MG tablet Take 1 tablet (40 mg total) by mouth daily. 04/06/23  Yes End, Cristal Deer, MD  Semaglutide, 1 MG/DOSE, 4 MG/3ML SOPN Inject 1 mg as directed once a week. Patient taking differently: Inject 1 mg as directed once a week. On Mondays 05/16/23  Yes [name], [name], DO  spironolactone (ALDACTONE) 25 MG tablet Take 1 tablet (25 mg total) by mouth daily. 06/23/23  Yes Charise Killian, MD  TRELEGY ELLIPTA 100-62.5-25 MCG/ACT AEPB Inhale 1 puff by mouth once daily 05/16/23  Yes [name], [name], DO  VENTOLIN HFA 108 (90 Base) MCG/ACT inhaler INHALE 2 PUFFS BY MOUTH EVERY 6 HOURS AS NEEDED FOR  WHEEZING OR SHORTNESS OF BREATH 05/16/23  Yes [name], [name], DO  warfarin (COUMADIN) 4 MG tablet TAKE 1 TABLET BY MOUTH ONCE DAILY AT  6PM  AS  DIRECTED Patient taking differently: TAKE 4MG  BY MOUTH FOR 2 DAYS, THEN TAKE 7.5MG  ON THE THIRD DAY, THEN REPEAT 05/16/23  Yes [name], [name], DO  warfarin (COUMADIN) 7.5 MG tablet TAKE 4MG  BY MOUTH FOR 2 DAYS, THEN TAKE 7.5MG  ON THE THIRD DAY, THEN REPEAT 05/16/23  Yes [name], [name], DO  albuterol (PROVENTIL) (2.5 MG/3ML) 0.083% nebulizer solution USE 1 VIAL IN NEBULIZER EVERY 6 HOURS AS NEEDED FOR WHEEZING FOR SHORTNESS OF BREATH Patient not taking: Reported on 06/22/2023 06/07/22   Olevia Perches P, DO  amiodarone (NEXTERONE PREMIX) 360-4.14 MG/200ML-% SOLN Inject 30 mg/hr into the vein continuous. Patient not taking: Reported  on 06/22/2023 06/22/23   Charise Killian, MD  furosemide (LASIX) 10 MG/ML injection Inject 8 mLs (80 mg total) into the vein 2 (two) times daily. Patient not taking: Reported on 06/22/2023 06/22/23   Charise Killian, MD  heparin 08657 UT/250ML infusion Inject 2,050 Units/hr into the vein continuous. Patient not taking: Reported on 06/22/2023 06/22/23   Charise Killian, MD     Allergies:   No Known Allergies  Social History:   Social History   Socioeconomic History   Marital status: Single    Spouse name: Not on file   Number of children: 1   Years of education: 4   Highest education level: Associate degree: academic program  Occupational History   Occupation: Disabled    Employer: UNEMPLOYED  Tobacco Use   Smoking status: Former    Current packs/day: 0.00    Average packs/day: 0.5 packs/day for 46.0 years (23.0 ttl pk-yrs)    Types: Cigarettes    Start date: 07/16/2017    Quit date: 05/11/2019    Years since quitting: 4.1   Smokeless tobacco: Never  Vaping Use   Vaping status: Never Used  Substance and Sexual Activity   Alcohol use: No    Alcohol/week: 0.0 standard drinks of alcohol   Drug  use: No   Sexual activity: Not Currently  Other Topics Concern   Not on file  Social History Narrative   Not on file   Social Drivers of Health   Financial Resource Strain: Low Risk  (08/21/2022)   Overall Financial Resource Strain (CARDIA)    Difficulty of Paying Living Expenses: Not very hard  Food Insecurity: No Food Insecurity (06/20/2023)   Hunger Vital Sign    Worried About Running Out of Food in the Last Year: Never true    Ran Out of Food in the Last Year: Never true  Transportation Needs: No Transportation Needs (06/20/2023)   PRAPARE - Administrator, Civil Service (Medical): No    Lack of Transportation (Non-Medical): No  Physical Activity: Inactive (08/21/2022)   Exercise Vital Sign    Days of Exercise per Week: 0 days    Minutes of Exercise per Session: 0 min  Stress: No Stress Concern Present (08/21/2022)   [employer] of Occupational Health - Occupational Stress Questionnaire    Feeling of Stress : Only a little  Social Connections: Socially Isolated (08/21/2022)   Social Connection and Isolation Panel [NHANES]    Frequency of Communication with Friends and Family: More than three times a week    Frequency of Social Gatherings with Friends and Family: Never    Attends Religious Services: Never    Database administrator or Organizations: No    Attends Banker Meetings: Never    Marital Status: Divorced  Catering manager Violence: Not At Risk (06/20/2023)   Humiliation, Afraid, Rape, and Kick questionnaire    Fear of Current or Ex-Partner: No    Emotionally Abused: No    Physically Abused: No    Sexually Abused: No   Family History:   The patient's family history includes Arthritis in his father and mother; Breast cancer in his sister; Cancer in his brother; Colon cancer in his father and mother; Dementia in his mother; Diabetes in his father; Heart attack in his brother and brother; Heart disease in his brother; Seizures in his  sister; Stroke in his father.    ROS:  Please see the history of present illness.  All other  ROS reviewed and negative.     Physical Exam/Data:   Vitals:   06/22/23 1903 06/22/23 1904  BP:  117/83  Pulse:  100  Resp:  (!) 22  Temp: 98.8 F (37.1 C) 98.8 F (37.1 C)  TempSrc: Oral Oral  SpO2:  95%  Weight:  120.7 kg  Height:  5\' 6"  (1.676 m)   No intake or output data in the 24 hours ending 06/22/23 2317    06/22/2023    7:04 PM 06/22/2023    5:00 AM 06/19/2023    8:39 PM  Last 3 Weights  Weight (lbs) 266 lb 1.5 oz 272 lb 14.9 oz 284 lb 13.4 oz  Weight (kg) 120.7 kg 123.8 kg 129.2 kg     Body mass index is 42.95 kg/m.  General:  Well nourished, well developed, in no acute distress HEENT: normal Neck:  JVD appreciated at 90 deg Vascular: No carotid bruits; Distal pulses 2+ bilaterally   Cardiac:  irregular rate Lungs:  clear to auscultation bilaterally, no wheezing, rhonchi or rales  Abd: soft, nontender, no hepatomegaly  Ext: warm Musculoskeletal:  No deformities, BUE and BLE strength normal and equal Skin: warm and dry  Neuro:  CNs 2-12 intact, no focal abnormalities noted Psych:  Normal affect    Relevant CV Studies:  Cardiac catheterization 06/20/2023  Conclusions: Mild to moderate, non-obstructive coronary artery disease with 60% D1 stenosis and 20% mid LAD and proximal RCA lesions.  No significant change noted from prior angiogram in 2020. Moderately elevated left heart filling pressure (PCWP 30 mmHg with prominent v-waves). Severely elevated right heart filling pressure (RA 25, RVEDP 18 mmHg). Moderate pulmonary hypertension (PA 50/28, mean 35 mmHg). Moderately reduced Fick cardiac output/index (CO 4.4 L/min, CI 1.9 L/min/m^2).   Recommendations: Resume diuresis.  If renal function worsens and/or adequate diuresis cannot be achieved, inotropic therapy may need to be considered. Reduce metoprolol succinate in the setting of low-output heart  failure. Consult advanced heart failure and electrophysiology teams to assist with management of acute on chronic HFrEF and sustained ventricular tachycardia. Resume heparin infusion 2 hours after TR band has been removed; transition back to warfarin once it is clear that no further invasive procedures will be needed this admission.    06/20/2023   1. Left ventricular ejection fraction, by estimation, is 20 to 25%. The  left ventricle has severely decreased function. The left ventricle  demonstrates global hypokinesis. The left ventricular internal cavity size  was mildly dilated. There is mild left  ventricular hypertrophy. Left ventricular diastolic parameters are  indeterminate.   2. Right ventricular systolic function is moderately reduced. The right  ventricular size is normal. There is mildly elevated pulmonary artery  systolic pressure. The estimated right ventricular systolic pressure is  39.6 mmHg.   3. Left atrial size was moderately dilated.   4. The mitral valve is normal in structure. Mild mitral valve  regurgitation. No evidence of mitral stenosis.   5. Tricuspid valve regurgitation is moderate to severe.   6. The aortic valve has been repaired/replaced. There is a 27 mm  Carbometrics mechanical valve present. Aortic valve regurgitation is not  visualized. No aortic stenosis is present. Aortic valve mean gradient  measures 8.0 mmHg.   7. There is mild dilatation of the aortic arch, measuring 43 mm.   8. The inferior vena cava is dilated in size with <50% respiratory  variability, suggesting right atrial pressure of 15 mmHg.   FINDINGS   Left Ventricle: Left ventricular  ejection fraction, by estimation, is 20  to 25%. The left ventricle has severely decreased function. The left  ventricle demonstrates global hypokinesis. Definity contrast agent was  given IV to delineate the left  ventricular endocardial borders. The left ventricular internal cavity size  was mildly  dilated. There is mild left ventricular hypertrophy. Left  ventricular diastolic parameters are indeterminate.   Right Ventricle: The right ventricular size is normal. No increase in  right ventricular wall thickness. Right ventricular systolic function is  moderately reduced. There is mildly elevated pulmonary artery systolic  pressure. The tricuspid regurgitant  velocity is 2.48 m/s, and with an assumed right atrial pressure of 15  mmHg, the estimated right ventricular systolic pressure is 39.6 mmHg.   Left Atrium: Left atrial size was moderately dilated.   Right Atrium: Right atrial size was normal in size.   Pericardium: There is no evidence of pericardial effusion.   Mitral Valve: The mitral valve is normal in structure. Mild mitral valve  regurgitation. No evidence of mitral valve stenosis. MV peak gradient, 8.9  mmHg. The mean mitral valve gradient is 4.0 mmHg.   Tricuspid Valve: The tricuspid valve is normal in structure. Tricuspid  valve regurgitation is moderate to severe. No evidence of tricuspid  stenosis.   Aortic Valve: The aortic valve has been repaired/replaced. Aortic valve  regurgitation is not visualized. No aortic stenosis is present. Aortic  valve mean gradient measures 8.0 mmHg. Aortic valve peak gradient measures  13.7 mmHg. Aortic valve area, by VTI   measures 1.12 cm. There is a mechanical valve present in the aortic  position.   Pulmonic Valve: The pulmonic valve was normal in structure. Pulmonic valve  regurgitation is not visualized. No evidence of pulmonic stenosis.   Aorta: The aortic root is normal in size and structure. There is mild  dilatation of the aortic arch, measuring 43 mm.   Venous: The inferior vena cava is dilated in size with less than 50%  respiratory variability, suggesting right atrial pressure of 15 mmHg.   IAS/Shunts: No atrial level shunt detected by color flow Doppler.     LEFT VENTRICLE  PLAX 2D  LVIDd:         5.30 cm   LVIDs:         5.00 cm  LV PW:         1.40 cm  LV IVS:        1.30 cm  LVOT diam:     2.00 cm  LV SV:         40  LV SV Index:   17  LVOT Area:     3.14 cm     RIGHT VENTRICLE  RV Basal diam:  4.90 cm  RV Mid diam:    3.50 cm  RV S prime:     7.87 cm/s   LEFT ATRIUM             Index        RIGHT ATRIUM           Index  LA diam:        6.00 cm 2.58 cm/m   RA Area:     41.90 cm  LA Vol (A2C):   99.2 ml 42.68 ml/m  RA Volume:   174.00 ml 74.87 ml/m  LA Vol (A4C):   69.0 ml 29.69 ml/m  LA Biplane Vol: 82.7 ml 35.58 ml/m   AORTIC VALVE  PULMONIC VALVE  AV Area (Vmax):    1.13 cm      PV Vmax:       1.19 m/s  AV Area (Vmean):   1.00 cm      PV Peak grad:  5.7 mmHg  AV Area (VTI):     1.12 cm  AV Vmax:           185.33 cm/s  AV Vmean:          133.667 cm/s  AV VTI:            0.360 m  AV Peak Grad:      13.7 mmHg  AV Mean Grad:      8.0 mmHg  LVOT Vmax:         66.73 cm/s  LVOT Vmean:        42.433 cm/s  LVOT VTI:          0.128 m  LVOT/AV VTI ratio: 0.36    AORTA  Ao Root diam: 3.40 cm   MITRAL VALVE                TRICUSPID VALVE  MV Area (PHT): 3.81 cm     TR Peak grad:   24.6 mmHg  MV Area VTI:   1.60 cm     TR Vmax:        248.00 cm/s  MV Peak grad:  8.9 mmHg  MV Mean grad:  4.0 mmHg     SHUNTS  MV Vmax:       1.49 m/s     Systemic VTI:  0.13 m  MV Vmean:      91.7 cm/s    Systemic Diam: 2.00 cm  MV Decel Time: 199 msec  MV E velocity: 140.00 cm/s   Roberto Nordmann MD  Electronically signed by Roberto Nordmann MD  Signature Date/Time: 06/20/2023/2:26:04 PM   Laboratory Data:  High Sensitivity Troponin:   Recent Labs  Lab 06/17/23 2110 06/18/23 0635  TROPONINIHS 41* 509*      Chemistry Recent Labs  Lab 06/18/23 0936 06/19/23 0322 06/21/23 0608 06/22/23 0508  NA  --    < > 137 138  K  --    < > 3.6 3.8  CL  --    < > 107 102  CO2  --    < > 20* 24  GLUCOSE  --    < > 106* 104*  BUN  --    < > 22 27*  CREATININE  --     < > 1.39* 1.53*  CALCIUM  --    < > 8.7* 9.2  MG 2.8*  --  2.5*  --   GFRNONAA  --    < > 55* 49*  ANIONGAP  --    < > 10 12   < > = values in this interval not displayed.    Recent Labs  Lab 06/17/23 2110  PROT 7.2  ALBUMIN 4.0  AST 183*  ALT 113*  ALKPHOS 52  BILITOT 0.8   Lipids No results for input(s): "CHOL", "TRIG", "HDL", "LABVLDL", "LDLCALC", "CHOLHDL" in the last 168 hours. Hematology Recent Labs  Lab 06/21/23 0608 06/22/23 0508  WBC 9.3 8.8  RBC 4.41 4.45  HGB 12.5* 12.4*  HCT 37.8* 38.4*  MCV 85.7 86.3  MCH 28.3 27.9  MCHC 33.1 32.3  RDW 15.8* 15.9*  PLT 186 190   Thyroid  Recent Labs  Lab 06/18/23 0936  TSH 1.817   BNP  Recent Labs  Lab 06/17/23 2110  BNP 224.7*    DDimer No results for input(s): "DDIMER" in the last 168 hours.   Radiology/Studies:  No results found.   Assessment and Plan:   Roberto [name] is a 67 y.o. male with AS s/p 27mm carbomerics mechanical AVR in 2016, non-obstrurctive CAD, TIA who is being seen 06/22/2023 for the evaluation of ventricular tachycardia.  Acute on chronic systolic heart failure Non-ischemic cardiomyopathy. Most recent [facility] cardiac index 1.9 with RA 25, Wedge 30. Continue Diuresis for now. May need cardiac MRI to assess scar burden.  -f/u with ADV HF team -continue spot diuresis 80 IV lasix -continue spironolactone -continue dapagliflozin  -hold BB for now  Sustained Ventricular Tachycardia No episodes of VT thus far. Remains on amiodarone infusion. Qtc is acceptable.  -f/u with EP regarding ICD  and anti-arrhythmic therapies  Mechanical AVR -continue warfarin (goal INR 2.5-3.5)  -continue heparin infusion Atrial fibrillation -continue amiodarone -hold beta blocker for now Aortic Aneursym  -ascending aorta measuring 5.7cm. Previously followed by Dr. Laneta Simmers   Risk Assessment/Risk Scores:       New York Heart Association (NYHA) Functional Class NYHA Class II  CHA2DS2-VASc Score =      This indicates a  % annual risk of stroke. The patient's score is based upon:       Code Status: Full Code  Severity of Illness: The appropriate patient status for this patient is INPATIENT. Inpatient status is judged to be reasonable and necessary in order to provide the required intensity of service to ensure the patient's safety. The patient's presenting symptoms, physical exam findings, and initial radiographic and laboratory data in the context of their chronic comorbidities is felt to place them at high risk for further clinical deterioration. Furthermore, it is not anticipated that the patient will be medically stable for discharge from the hospital within 2 midnights of admission.   * I certify that at the point of admission it is my clinical judgment that the patient will require inpatient hospital care spanning beyond 2 midnights from the point of admission due to high intensity of service, high risk for further deterioration and high frequency of surveillance required.*   For questions or updates, please contact [facility] Please consult [email or web address] for contact info under     Signed, Donavan Burnet, MD  06/22/2023 11:17 PM

## 2023-06-22 NOTE — Progress Notes (Signed)
   06/22/23 1300  Spiritual Encounters  Type of Visit Initial  Care provided to: Patient;Significant other  Referral source Patient request  Reason for visit Advance directives  OnCall Visit Yes  Spiritual Framework  Presenting Themes Meaning/purpose/sources of inspiration  Patient Stress Factors Major life changes  Family Stress Factors Major life changes  Interventions  Spiritual Care Interventions Made Established relationship of care and support;Compassionate presence;Encouragement  Intervention Outcomes  Outcomes Connection to spiritual care;Awareness of support  Spiritual Care Plan  Spiritual Care Issues Still Outstanding Chaplain will continue to follow   Left paperwork for patient and friend to fill out. Educated them on the advance directive paperwork and why it may be needed for the patient. Will return once patient has finish filling out paperwork.

## 2023-06-22 NOTE — Progress Notes (Signed)
Advanced Heart Failure Rounding Note  Cardiologist: Yvonne Kendall, MD   Subjective:   - 4.6L urine output yesterday.  - Improvement in dyspnea and PND; however, becomes SOB with exertion.   Objective:   Weight Range: 123.8 kg Body mass index is 44.05 kg/m.   Vital Signs:   Temp:  [97.7 F (36.5 C)-98.3 F (36.8 C)] 97.7 F (36.5 C) (12/20 1122) Pulse Rate:  [67-82] 71 (12/20 1122) Resp:  [16-21] 21 (12/20 1122) BP: (108-130)/(71-94) 130/94 (12/20 1122) SpO2:  [98 %-100 %] 98 % (12/20 1122) Weight:  [123.8 kg] 123.8 kg (12/20 0500)    Weight change: Filed Weights   06/17/23 2110 06/19/23 2039 06/22/23 0500  Weight: 117.9 kg 129.2 kg 123.8 kg    Intake/Output:   Intake/Output Summary (Last 24 hours) at 06/22/2023 1435 Last data filed at 06/22/2023 1416 Gross per 24 hour  Intake 1096.4 ml  Output 5475 ml  Net -4378.6 ml      Physical Exam    General:   No resp difficulty HEENT: Normal Neck: Supple. JVP to midneck. Cor: PMI nondisplaced. Regular rate & rhythm. No rubs, gallops or murmurs. Lungs: decreased at bases Abdomen: distended; protuberant abdomen.  Extremities: No cyanosis, clubbing, rash, R and LLE 2+ edema in b/l LE Neuro: Alert & orientedx3, cranial nerves grossly intact. moves all 4 extremities w/o difficulty. Affect pleasant   Telemetry   SR   EKG    N/A   Labs    CBC Recent Labs    06/21/23 0608 06/22/23 0508  WBC 9.3 8.8  HGB 12.5* 12.4*  HCT 37.8* 38.4*  MCV 85.7 86.3  PLT 186 190   Basic Metabolic Panel Recent Labs    69/62/95 0608 06/22/23 0508  NA 137 138  K 3.6 3.8  CL 107 102  CO2 20* 24  GLUCOSE 106* 104*  BUN 22 27*  CREATININE 1.39* 1.53*  CALCIUM 8.7* 9.2  MG 2.5*  --    Liver Function Tests No results for input(s): "AST", "ALT", "ALKPHOS", "BILITOT", "PROT", "ALBUMIN" in the last 72 hours. No results for input(s): "LIPASE", "AMYLASE" in the last 72 hours. Cardiac Enzymes No results for  input(s): "CKTOTAL", "CKMB", "CKMBINDEX", "TROPONINI" in the last 72 hours.  BNP: BNP (last 3 results) Recent Labs    06/17/23 2110  BNP 224.7*     Other results:   Imaging    No results found.    Medications:     Scheduled Medications:  allopurinol  300 mg Oral Daily   aspirin  81 mg Oral Daily   buPROPion  150 mg Oral BID   dapagliflozin propanediol  10 mg Oral Daily   fenofibrate  54 mg Oral Daily   furosemide  80 mg Intravenous BID   insulin aspart  0-5 Units Subcutaneous QHS   insulin aspart  0-9 Units Subcutaneous TID WC   nicotine  7 mg Transdermal Daily   omega-3 acid ethyl esters  1 g Oral Daily   potassium chloride SA  20 mEq Oral Daily   rosuvastatin  40 mg Oral Daily   spironolactone  25 mg Oral Daily   warfarin  7.5 mg Oral ONCE-1600   Warfarin - Pharmacist Dosing Inpatient   Does not apply q1600    Infusions:  amiodarone 30 mg/hr (06/22/23 1345)   heparin 2,050 Units/hr (06/22/23 0707)    PRN Medications: acetaminophen **OR** acetaminophen, albuterol, cyclobenzaprine, magnesium hydroxide, ondansetron **OR** ondansetron (ZOFRAN) IV, mouth rinse, traZODone   Assessment/Plan  Acute on chronic systolic heart failure  -  Echocardiogram with LVEF 25% and preserved RV function in the setting of volume overload. Severe TR.  - Cardiac MRI to evaluate for scar burden / LGE; apex appears to be thin and akinetic on contrasted echo imaging.   - Based on recent VT and worsening of LVEF, suspect Mr. Schaver may require advanced therapies in the future; high risk due to aortic aneurysm. His only support at this time is his girlfriend.  -On exam today remains volume overloaded. Continue IV lasix 80mg  BID.  -Plan for transfer to [facility] today.    Ventricular tachycardia - Likely secondary to decompensated heart failure with severely elevated filling pressures and reduced cardiac index.  - No VT overnight.  - EP following. At this time patient will be  unable to undergo ICD placement at [facility]. Will transfer patient to [facility] for ICD placement early next week after continued diuresis.   Mechanical AVR - [employer] 27mm valve placed in 2011 for bicuspid valve disease - Continue heparin gtt - Continue warfarin; will need therapeutic INR prior to placement of ICD.  - Discussed warfarin dosing with pharmD.    Atrial fibrillation - TSH 1.8; within normal limits.  - On Heparin drip. INR 1.6   5. Tobacco use - Reports that he has started smoking again.  - Will place nicotine patch; discussed with pharmD.   6. Aortic aneurysm - Followed by Dr. Laneta Simmers now - 5.7cm fusiform ascending aortic aneurysm; high risk for re-do surgery. High risk for advanced therapies.    Length of Stay: 5  Esme Durkin, DO  06/22/2023, 2:35 PM  Advanced Heart Failure Team Pager [phone] (M-F; 7a - 5p)  Please contact [facility] for night-coverage after hours (5p -7a ) and weekends on [email or web address]

## 2023-06-22 NOTE — Progress Notes (Signed)
PHARMACY - ANTICOAGULATION CONSULT NOTE  Pharmacy Consult for heparin/warfarin Indication: atrial fibrillation + mechanical valve AVR  Patient Measurements: Height: 5\' 6"  (167.6 cm) Weight: 123.8 kg (272 lb 14.9 oz) IBW/kg (Calculated) : 63.8 Heparin Dosing Weight: 91.2 kg  Labs: Recent Labs    06/20/23 0437 06/20/23 1324 06/20/23 1328 06/21/23 0040 06/21/23 0608 06/22/23 0508 06/22/23 1438  HGB 13.8   < > 12.6*  --  12.5* 12.4*  --   HCT 43.3   < > 37.0*  --  37.8* 38.4*  --   PLT 215  --   --   --  186 190  --   LABPROT 23.1*  --   --   --  21.0* 18.8*  --   INR 2.0*  --   --   --  1.8* 1.6*  --   HEPARINUNFRC 0.19*  --   --    < > 0.38 0.28* 0.49  CREATININE 1.21  --   --   --  1.39* 1.53*  --    < > = values in this interval not displayed.   Estimated Creatinine Clearance: 57.4 mL/min (A) (by C-G formula based on SCr of 1.53 mg/dL (H)).  Medical History: Past Medical History:  Diagnosis Date   Bicuspid aortic valve    a. s/p #27 [employer] mechanical valve on 03/25/2010; b. on Coumadin; c. TTE 12/17: EF 40-45%, moderately dilated LV with moderate LVH, AVR well-seated with 14 mmHg gradient, peak AV velocity 2.5 m/s, mild mitral valve thickening with mild MR, mildly dilated RV with mildly reduced contraction   Cellulitis    Chronic kidney disease    Chronic systolic CHF (congestive heart failure) (HCC)    a. R/LHC 03/2010 showed no significant CAD, LVEDP 31 mmHg, mean AoV gradient 34 mmHg at rest and 47 mmHg with dobutamine 20 mcg/kg/min, AVA 1.0 cm^2, RA 31, RV 68/25, PA 68/47, PCWP 38. PA sat 65%. CO 6.2 L/min (Fick) and 5.3 L/min (thermodilution)   Clotting disorder (HCC)    H/O mechanical aortic valve replacement 03/25/2010   a. #27 [employer] mechanical valve   Hypercholesterolemia    Renal infarct [facility]) 2017   Multiple right renal infarcts, likely embolic.   Stroke [facility])    TIA (transient ischemic attack) 05/2014   Assessment: 67 y/o male presenting with  palpitations. PMH significant for atrial fibrillation and mechanical aortic valve replacement in 03/2010 (on warfarin PTA). Pharmacy has been consulted to initiate heparin infusion given that patient may need heart catheterization. CHADS2VASc 5 (HTN, DM, CAD, Age, HTN).   Per anticoagulation clinic notes:  Home warfarin regimen is 7.5 mg on Tuesdays and Fridays, 4 mg all other days INR goal is 2.5-3.5  Per patient: last warfarin dose in evening of 12/15; pt cannot remember what dose of warfarin he took  Heparin             Goal of Therapy:  Heparin level 0.3-0.7 units/ml Monitor platelets by anticoagulation protocol: Yes  Labs:  1216 1642 HL = 0.14; subtherapeutic on 1250 units/hr  1217 0322 HL = 0.44; therapeutic X 1  1217 0951 HL = 0.41; therapeutic x 2  1218 0437 HL = 0.19; SUBtherapeutic  1219 0040 HL = 0.57, therapeutic X 1  1219 0608 HL = 0.38  1220 0508 HL = 0.28, SUBtherapeutic 1220 1438 HL = 0.49, therapeutic x 1  Plan:  Heparin level is therapeutic x 1 Continue heparin infusion at 2050 units/hr Heparin bridge until INR is > 2.5  Warfarin             Goal of Therapy: INR 2.5 - 3.5  Monitor platelets by anticoagulation protocol: Yes  INR Date INR Clinical  12/19 1.8 After holding 3 days  12/20 1.6 subtherapeutic   Plan:  Bridge with heparin until INR is > 2.5 then can stop Give patient another 7.5 mg today  Planned ICD placement next week; once he is euvolemic and his INR is between 1.8 and 2.5  Plan to continue 7.5 mg warfarin over the weekend (on 35 mg of warfarin per week).   Thank you for involving pharmacy in this patient's care.   Tressie Ellis 06/22/2023 3:32 PM

## 2023-06-22 NOTE — Progress Notes (Signed)
PHARMACY - ANTICOAGULATION CONSULT NOTE  Pharmacy Consult for heparin/warfarin Indication: atrial fibrillation + mechanical valve AVR  Patient Measurements: Height: 5\' 6"  (167.6 cm) Weight: 120.7 kg (266 lb 1.5 oz) IBW/kg (Calculated) : 63.8 Heparin Dosing Weight: 91.2 kg  Labs: Recent Labs    06/20/23 0437 06/20/23 1324 06/20/23 1328 06/21/23 0040 06/21/23 0608 06/22/23 0508 06/22/23 1438 06/22/23 2114  HGB 13.8   < > 12.6*  --  12.5* 12.4*  --   --   HCT 43.3   < > 37.0*  --  37.8* 38.4*  --   --   PLT 215  --   --   --  186 190  --   --   LABPROT 23.1*  --   --   --  21.0* 18.8*  --   --   INR 2.0*  --   --   --  1.8* 1.6*  --   --   HEPARINUNFRC 0.19*  --   --    < > 0.38 0.28* 0.49 0.51  CREATININE 1.21  --   --   --  1.39* 1.53*  --   --    < > = values in this interval not displayed.   Estimated Creatinine Clearance: 56.6 mL/min (A) (by C-G formula based on SCr of 1.53 mg/dL (H)).  Medical History: Past Medical History:  Diagnosis Date   Bicuspid aortic valve    a. s/p #27 [employer] mechanical valve on 03/25/2010; b. on Coumadin; c. TTE 12/17: EF 40-45%, moderately dilated LV with moderate LVH, AVR well-seated with 14 mmHg gradient, peak AV velocity 2.5 m/s, mild mitral valve thickening with mild MR, mildly dilated RV with mildly reduced contraction   Cellulitis    Chronic kidney disease    Chronic systolic CHF (congestive heart failure) (HCC)    a. R/LHC 03/2010 showed no significant CAD, LVEDP 31 mmHg, mean AoV gradient 34 mmHg at rest and 47 mmHg with dobutamine 20 mcg/kg/min, AVA 1.0 cm^2, RA 31, RV 68/25, PA 68/47, PCWP 38. PA sat 65%. CO 6.2 L/min (Fick) and 5.3 L/min (thermodilution)   Clotting disorder (HCC)    H/O mechanical aortic valve replacement 03/25/2010   a. #27 [employer] mechanical valve   Hypercholesterolemia    Renal infarct [facility]) 2017   Multiple right renal infarcts, likely embolic.   Stroke [facility])    TIA (transient ischemic attack) 05/2014    Assessment: 67 y/o male presenting with palpitations. PMH significant for atrial fibrillation and mechanical aortic valve replacement in 03/2010 (on warfarin PTA). Pharmacy has been consulted to initiate heparin infusion given that patient may need heart catheterization. CHADS2VASc 5 (HTN, DM, CAD, Age, HTN).   Per anticoagulation clinic notes:  Home warfarin regimen is 7.5 mg on Tuesdays and Fridays, 4 mg all other days INR goal is 2.5-3.5  Per patient: last warfarin dose in evening of 12/15; pt cannot remember what dose of warfarin he took.   Heparin level 0.51 (therapeutic) on infusion at 2050 units/hr.  Goal of Therapy:  Heparin level 0.3-0.7 units/ml Monitor platelets by anticoagulation protocol: Yes  Plan:  Continue heparin infusion at 2050 units/hr Heparin bridge until ICD implant F/u daily INR - warfarin 7.5 given earlier today Planned ICD placement next week; once he is euvolemic and his INR is between 1.8 and 2.5 per EP note  Thank you for involving pharmacy in this patient's care.   Christoper Fabian, PharmD, BCPS Please see amion for complete clinical pharmacist phone list 06/22/2023 10:17 PM

## 2023-06-22 NOTE — Progress Notes (Addendum)
PHARMACY - ANTICOAGULATION CONSULT NOTE  Pharmacy Consult for heparin/warfarin Indication: atrial fibrillation + mechanical valve AVR  No Known Allergies  Patient Measurements: Height: 5\' 6"  (167.6 cm) Weight: 123.8 kg (272 lb 14.9 oz) IBW/kg (Calculated) : 63.8 Heparin Dosing Weight: 91.2 kg  Vital Signs: Temp: 97.9 F (36.6 C) (12/20 0356) BP: 108/78 (12/20 0356) Pulse Rate: 67 (12/20 0356)  Labs: Recent Labs    06/20/23 0437 06/20/23 1324 06/20/23 1328 06/21/23 0040 06/21/23 0608 06/22/23 0508  HGB 13.8   < > 12.6*  --  12.5* 12.4*  HCT 43.3   < > 37.0*  --  37.8* 38.4*  PLT 215  --   --   --  186 190  LABPROT 23.1*  --   --   --  21.0* 18.8*  INR 2.0*  --   --   --  1.8* 1.6*  HEPARINUNFRC 0.19*  --   --  0.57 0.38 0.28*  CREATININE 1.21  --   --   --  1.39* 1.53*   < > = values in this interval not displayed.   Estimated Creatinine Clearance: 57.4 mL/min (A) (by C-G formula based on SCr of 1.53 mg/dL (H)).  Medical History: Past Medical History:  Diagnosis Date   Bicuspid aortic valve    a. s/p #27 [employer] mechanical valve on 03/25/2010; b. on Coumadin; c. TTE 12/17: EF 40-45%, moderately dilated LV with moderate LVH, AVR well-seated with 14 mmHg gradient, peak AV velocity 2.5 m/s, mild mitral valve thickening with mild MR, mildly dilated RV with mildly reduced contraction   Cellulitis    Chronic kidney disease    Chronic systolic CHF (congestive heart failure) (HCC)    a. R/LHC 03/2010 showed no significant CAD, LVEDP 31 mmHg, mean AoV gradient 34 mmHg at rest and 47 mmHg with dobutamine 20 mcg/kg/min, AVA 1.0 cm^2, RA 31, RV 68/25, PA 68/47, PCWP 38. PA sat 65%. CO 6.2 L/min (Fick) and 5.3 L/min (thermodilution)   Clotting disorder (HCC)    H/O mechanical aortic valve replacement 03/25/2010   a. #27 [employer] mechanical valve   Hypercholesterolemia    Renal infarct [facility]) 2017   Multiple right renal infarcts, likely embolic.   Stroke [facility])    TIA  (transient ischemic attack) 05/2014   Medications:  Scheduled:   allopurinol  300 mg Oral Daily   aspirin  81 mg Oral Daily   buPROPion  150 mg Oral BID   dapagliflozin propanediol  10 mg Oral Daily   fenofibrate  54 mg Oral Daily   furosemide  80 mg Intravenous BID   insulin aspart  0-5 Units Subcutaneous QHS   insulin aspart  0-9 Units Subcutaneous TID WC   nicotine  7 mg Transdermal Daily   omega-3 acid ethyl esters  1 g Oral Daily   potassium chloride SA  20 mEq Oral Daily   potassium chloride  40 mEq Oral Q4H   rosuvastatin  40 mg Oral Daily   sodium chloride flush  3 mL Intravenous Q12H   spironolactone  25 mg Oral Daily   Warfarin - Pharmacist Dosing Inpatient   Does not apply q1600   Assessment: 67 y/o male presenting with palpitations. PMH significant for atrial fibrillation and mechanical aortic valve replacement in 03/2010 (on warfarin PTA). Pharmacy has been consulted to initiate heparin infusion given that patient may need heart catheterization. CHADS2VASc 5 (HTN, DM, CAD, Age, HTN).   Per anticoagulation clinic notes:  Home warfarin regimen is 7.5 mg on  Tuesdays and Fridays, 4 mg all other days INR goal is 2.5-3.5  Per patient: last warfarin dose in evening of 12/15; pt cannot remember what dose of warfarin he took  Heparin             Goal of Therapy:  Heparin level 0.3-0.7 units/ml Monitor platelets by anticoagulation protocol: Yes  Labs:  1216 1642 HL = 0.14; subtherapeutic on 1250 units/hr  1217 0322 HL = 0.44; therapeutic X 1  1217 0951 HL = 0.41; therapeutic x 2  1218 0437 HL = 0.19; SUBtherapeutic  1219 0040 HL = 0.57, therapeutic X 1  1219 0608 HL = 0.38  1220 0508 HL = 0.28, SUBtherapeutic   Plan:  12/20:  HL @ 0508 = 0.28, SUBtherapeutic Will order heparin 1350 units IV X 1 bolus and increase drip rate to 2050 units/hr Would recommend a heparin bridge if warfarin is restarted until INR is > 2.5.  Warfarin has been held for 3  days  Warfarin             Goal of Therapy: INR 2.5 - 3.5  Monitor platelets by anticoagulation protocol: Yes  INR Date INR Clinical  12/19 1.8 After holding 3 days  12/20 1.6 subtherapeutic           Plan:  Bridge with heparin until INR is > 2.5 then can stop  Give patient another 7.5 mg today  Planned ICD placement next week; once he is euvolemic and his INR is between 1.8 and 2.5  Plan to continue 7.5 mg warfarin over the weekend (on 35 mg of warfarin per week).   Thank you for involving pharmacy in this patient's care.   Effie Shy, PharmD Pharmacy Resident  06/22/2023 7:41 AM

## 2023-06-22 NOTE — Care Management Important Message (Signed)
Important Message  Patient Details  Name: Roberto [name] MRN: [id number] Date of Birth: 1956-04-22   Important Message Given:  Yes - Medicare IM     [name] 06/22/2023, 10:21 AM

## 2023-06-22 NOTE — Progress Notes (Signed)
PHARMACY - ANTICOAGULATION CONSULT NOTE  Pharmacy Consult for heparin/warfarin Indication: atrial fibrillation + mechanical valve AVR  No Known Allergies  Patient Measurements: Height: 5\' 6"  (167.6 cm) Weight: 123.8 kg (272 lb 14.9 oz) IBW/kg (Calculated) : 63.8 Heparin Dosing Weight: 91.2 kg  Vital Signs: Temp: 97.9 F (36.6 C) (12/20 0356) BP: 108/78 (12/20 0356) Pulse Rate: 67 (12/20 0356)  Labs: Recent Labs    06/20/23 0437 06/20/23 1324 06/20/23 1328 06/21/23 0040 06/21/23 0608 06/22/23 0508  HGB 13.8   < > 12.6*  --  12.5* 12.4*  HCT 43.3   < > 37.0*  --  37.8* 38.4*  PLT 215  --   --   --  186 190  LABPROT 23.1*  --   --   --  21.0* 18.8*  INR 2.0*  --   --   --  1.8* 1.6*  HEPARINUNFRC 0.19*  --   --  0.57 0.38 0.28*  CREATININE 1.21  --   --   --  1.39* 1.53*   < > = values in this interval not displayed.   Estimated Creatinine Clearance: 57.4 mL/min (A) (by C-G formula based on SCr of 1.53 mg/dL (H)).  Medical History: Past Medical History:  Diagnosis Date   Bicuspid aortic valve    a. s/p #27 [employer] mechanical valve on 03/25/2010; b. on Coumadin; c. TTE 12/17: EF 40-45%, moderately dilated LV with moderate LVH, AVR well-seated with 14 mmHg gradient, peak AV velocity 2.5 m/s, mild mitral valve thickening with mild MR, mildly dilated RV with mildly reduced contraction   Cellulitis    Chronic kidney disease    Chronic systolic CHF (congestive heart failure) (HCC)    a. R/LHC 03/2010 showed no significant CAD, LVEDP 31 mmHg, mean AoV gradient 34 mmHg at rest and 47 mmHg with dobutamine 20 mcg/kg/min, AVA 1.0 cm^2, RA 31, RV 68/25, PA 68/47, PCWP 38. PA sat 65%. CO 6.2 L/min (Fick) and 5.3 L/min (thermodilution)   Clotting disorder (HCC)    H/O mechanical aortic valve replacement 03/25/2010   a. #27 [employer] mechanical valve   Hypercholesterolemia    Renal infarct [facility]) 2017   Multiple right renal infarcts, likely embolic.   Stroke [facility])    TIA  (transient ischemic attack) 05/2014   Medications:  Scheduled:   allopurinol  300 mg Oral Daily   aspirin  81 mg Oral Daily   buPROPion  150 mg Oral BID   dapagliflozin propanediol  10 mg Oral Daily   fenofibrate  54 mg Oral Daily   furosemide  80 mg Intravenous BID   heparin  1,350 Units Intravenous Once   insulin aspart  0-5 Units Subcutaneous QHS   insulin aspart  0-9 Units Subcutaneous TID WC   nicotine  7 mg Transdermal Daily   omega-3 acid ethyl esters  1 g Oral Daily   potassium chloride SA  20 mEq Oral Daily   rosuvastatin  40 mg Oral Daily   sodium chloride flush  3 mL Intravenous Q12H   spironolactone  25 mg Oral Daily   Warfarin - Pharmacist Dosing Inpatient   Does not apply q1600   Assessment: 67 y/o male presenting with palpitations. PMH significant for atrial fibrillation and mechanical aortic valve replacement in 03/2010 (on warfarin PTA). Pharmacy has been consulted to initiate heparin infusion given that patient may need heart catheterization. CHADS2VASc 5 (HTN, DM, CAD, Age, HTN).   Per anticoagulation clinic notes:  Home warfarin regimen is 7.5 mg on Tuesdays  and Fridays, 4 mg all other days INR goal is 2.5-3.5  Per patient: last warfarin dose in evening of 12/15; pt cannot remember what dose of warfarin he took  Heparin             Goal of Therapy:  Heparin level 0.3-0.7 units/ml Monitor platelets by anticoagulation protocol: Yes  Labs:  1216 1642 HL = 0.14; subtherapeutic on 1250 units/hr  1217 0322 HL = 0.44; therapeutic X 1  1217 0951 HL = 0.41; therapeutic x 2  1218 0437 HL = 0.19; SUBtherapeutic  1219 0040 HL = 0.57, therapeutic X 1  1219 0608 HL = 0.38  1220 0508 HL = 0.28, SUBtherapeutic   Plan:  12/20:  HL @ 0508 = 0.28, SUBtherapeutic - Will order heparin 1350 units IV X 1 bolus and increase drip rate to 2050 units/hr  Would recommend a heparin bridge if warfarin is restarted until INR is > 2.5.  Warfarin has been held for 3  days  Warfarin             Goal of Therapy: INR 2.5 - 3.5  Monitor platelets by anticoagulation protocol: Yes  INR Date INR Clinical  12/19 1.8 Baseline (3x holding)  12/20             Plan:  Bridge with heparin until INR is > 2.5 then can stop  Give patient 7.5 mg today   Thank you for involving pharmacy in this patient's care.   Scherrie Gerlach, PharmD Pharmacy Resident  06/22/2023 6:36 AM

## 2023-06-23 DIAGNOSIS — I5023 Acute on chronic systolic (congestive) heart failure: Secondary | ICD-10-CM

## 2023-06-23 LAB — GLUCOSE, CAPILLARY
Glucose-Capillary: 111 mg/dL — ABNORMAL HIGH (ref 70–99)
Glucose-Capillary: 110 mg/dL — ABNORMAL HIGH (ref 70–99)
Glucose-Capillary: 105 mg/dL — ABNORMAL HIGH (ref 70–99)
Glucose-Capillary: 120 mg/dL — ABNORMAL HIGH (ref 70–99)

## 2023-06-23 LAB — PROTIME-INR
INR: 1.7 — ABNORMAL HIGH (ref 0.8–1.2)
Prothrombin Time: 20.1 s — ABNORMAL HIGH (ref 11.4–15.2)

## 2023-06-23 LAB — LIPOPROTEIN A (LPA): Lipoprotein (a): 219.7 nmol/L — ABNORMAL HIGH (ref <75.0)

## 2023-06-23 LAB — HEPARIN LEVEL (UNFRACTIONATED): Heparin Unfractionated: 0.39 [IU]/mL (ref 0.30–0.70)

## 2023-06-23 MED ORDER — WARFARIN SODIUM 4 MG PO TABS
4.0000 mg | ORAL_TABLET | Freq: Once | ORAL | Status: AC
  Administered 2023-06-23: 4 mg via ORAL
  Filled 2023-06-23: qty 1

## 2023-06-23 MED ORDER — POTASSIUM CHLORIDE CRYS ER 20 MEQ PO TBCR
20.0000 meq | EXTENDED_RELEASE_TABLET | Freq: Once | ORAL | Status: AC
  Administered 2023-06-23: 20 meq via ORAL
  Filled 2023-06-23: qty 1

## 2023-06-23 MED ORDER — TORSEMIDE 20 MG PO TABS
40.0000 mg | ORAL_TABLET | Freq: Every day | ORAL | Status: DC
  Administered 2023-06-23: 40 mg via ORAL
  Filled 2023-06-23: qty 2

## 2023-06-23 NOTE — Progress Notes (Signed)
OT Cancellation Note  Patient Details Name: Roberto [name] MRN: [id number] DOB: 1955-07-23   Cancelled Treatment:    Reason Eval/Treat Not Completed: OT screened, no needs identified, will sign off. Per PT, this pt is independent in ADL and cognition WFL. PT provided education on CHF with handout. Please re-consult if change in status.    Tyler Deis, OTR/L [facility] Acute Rehabilitation Office: [phone]  Myrla Halsted 06/23/2023, 9:16 AM

## 2023-06-23 NOTE — Evaluation (Signed)
Physical Therapy Evaluation & Discharge Patient Details Name: Roberto [name] MRN: [id number] DOB: Nov 26, 1955 Today's Date: 06/23/2023  History of Present Illness  Pt is a 67 y.o. male presenting 12/15 with heart palpitations, diaphoresis and SHOB. EKG concerning for ventricular tachycardia with plan for cardioversion, but pt went into afib. Now s/p R/L heart cath; found to have severely reduced cardiac index with elevated filling pressures. Transferred to [facility] 12/20 for potential ICD placement. PMH significant for chronic systolic CHF, nonischemic cardiomyopathy, CKD, DMII, mechanical bicuspid valve replacement on chronic anticoagulation, dyslipidemia, CVA, thoracic aortic aneurysm, TIA.    Clinical Impression  Pt presents with condition above. PTA, he was independent without DME, living with his friend in a 2-level house with 1 STE. He does not need to go upstairs. Currently, pt reports and appears to be functioning at his baseline, displaying only a mild trunk sway when ambulating but not displaying any LOB with dynamic challenges or any need for DME or assistance. Educated pt on reducing sodium and processed foods intake, frequent mobilization and progression of mobility, energy conservation as needed, eating fresh/frozen fruits/veggies or rinsing canned foods, and weighing self daily to manage his CHF. Educated pt to mobilize with nursing and mobility techs while here. He verbalized understanding. All education completed and questions answered. No further acute PT needs identified. Will sig off. If pt ends up getting an ICD, please re-consult PT. Thank you.    Monitor showing afib at rest upon arrival and throughout session, no V Tach noted, HR ranging from 70s-131, BP stable throughout with positional changes 110s-120s/70s-80s      If plan is discharge home, recommend the following: Assistance with cooking/housework   Can travel by private vehicle        Equipment Recommendations None  recommended by PT  Recommendations for Other Services       Functional Status Assessment Patient has not had a recent decline in their functional status     Precautions / Restrictions Precautions Precautions: Other (comment) Precaution Comments: watch HR Restrictions Weight Bearing Restrictions Per Provider Order: No      Mobility  Bed Mobility               General bed mobility comments: Pt sitting EOB at start of session and sitting in chair end of session    Transfers Overall transfer level: Independent Equipment used: None               General transfer comment: No assistance needed, no LOB    Ambulation/Gait Ambulation/Gait assistance: Independent Gait Distance (Feet): 560 Feet Assistive device: None Gait Pattern/deviations: Step-through pattern Gait velocity: WFL Gait velocity interpretation: >4.37 ft/sec, indicative of normal walking speed   General Gait Details: Pt ambulates with a step-through gait pattern. Mild sway noted, but no overt LOB even with dynamic challenges  Stairs            Wheelchair Mobility     Tilt Bed    Modified Rankin (Stroke Patients Only)       Balance Overall balance assessment: Independent                                           Pertinent Vitals/Pain Pain Assessment Pain Assessment: Faces Faces Pain Scale: No hurt Pain Intervention(s): Monitored during session    Home Living Family/patient expects to be discharged to:: Private residence Living Arrangements: Non-relatives/Friends (male  best friend, who is available often but intermittently has to go into work) Available Help at Discharge: Family;Available PRN/intermittently Type of Home: House Home Access: Stairs to enter Entrance Stairs-Rails: None Entrance Stairs-Number of Steps: 1   Home Layout: Two level;Able to live on main level with bedroom/bathroom Home Equipment: Rolling Walker (2 wheels);Hospital bed      Prior  Function Prior Level of Function : Independent/Modified Independent;Driving             Mobility Comments: No AD, no recent falls ADLs Comments: Friend does the cooking and cleaning, seemingly more to be helpful than necessary     Extremity/Trunk Assessment   Upper Extremity Assessment Upper Extremity Assessment: Overall WFL for tasks assessed    Lower Extremity Assessment Lower Extremity Assessment: Overall WFL for tasks assessed    Cervical / Trunk Assessment Cervical / Trunk Assessment: Normal  Communication   Communication Communication: No apparent difficulties  Cognition Arousal: Alert Behavior During Therapy: WFL for tasks assessed/performed Overall Cognitive Status: Within Functional Limits for tasks assessed                                          General Comments General comments (skin integrity, edema, etc.): educated pt on reducing sodium and processed foods intake, frequent mobilization and progression of mobility, energy conservation as needed, eating fresh/frozen fruits/veggies or rinsing canned foods, and weighing self daily to manage his CHF - he verbalized understanding; educated pt to mobilize with nursing while here, he verbalized understanding; Monitor showing afib at rest upon arrival and throughout session, no V Tach noted, HR ranging from 70s-131, BP stable throughout with positional changes 110s-120s/70s-80s    Exercises     Assessment/Plan    PT Assessment Patient does not need any further PT services  PT Problem List         PT Treatment Interventions      PT Goals (Current goals can be found in the Care Plan section)  Acute Rehab PT Goals Patient Stated Goal: to go home PT Goal Formulation: All assessment and education complete, DC therapy Time For Goal Achievement: 06/24/23 Potential to Achieve Goals: Good    Frequency       Co-evaluation               AM-PAC PT "6 Clicks" Mobility  Outcome Measure Help  needed turning from your back to your side while in a flat bed without using bedrails?: None Help needed moving from lying on your back to sitting on the side of a flat bed without using bedrails?: None Help needed moving to and from a bed to a chair (including a wheelchair)?: None Help needed standing up from a chair using your arms (e.g., wheelchair or bedside chair)?: None Help needed to walk in hospital room?: None Help needed climbing 3-5 steps with a railing? : None 6 Click Score: 24    End of Session   Activity Tolerance: Patient tolerated treatment well Patient left: in chair;with call bell/phone within reach Nurse Communication: Mobility status;Other (comment) (HR, no chair alarm (RN ok with this)) PT Visit Diagnosis: Other (comment);Dizziness and giddiness (R42) (cardio deficits)    Time: 4696-2952 PT Time Calculation (min) (ACUTE ONLY): 21 min   Charges:   PT Evaluation $PT Eval Low Complexity: 1 Low   PT General Charges $$ ACUTE PT VISIT: 1 Visit  Virgil Benedict, PT, DPT Acute Rehabilitation Services  Office: [phone]   Bettina Gavia 06/23/2023, 9:23 AM

## 2023-06-23 NOTE — Progress Notes (Signed)
PHARMACY - ANTICOAGULATION CONSULT NOTE  Pharmacy Consult for heparin + warfarin Indication: atrial fibrillation + mechanical valve AVR  Patient Measurements: Height: 5\' 6"  (167.6 cm) Weight: 119.4 kg (263 lb 3.7 oz) IBW/kg (Calculated) : 63.8 Heparin Dosing Weight: 91.2 kg  Labs: Recent Labs    06/20/23 1328 06/21/23 0040 06/21/23 0608 06/22/23 0508 06/22/23 1438 06/22/23 2114 06/23/23 0206  HGB 12.6*  --  12.5* 12.4*  --   --   --   HCT 37.0*  --  37.8* 38.4*  --   --   --   PLT  --   --  186 190  --   --   --   LABPROT  --   --  21.0* 18.8*  --   --  20.1*  INR  --   --  1.8* 1.6*  --   --  1.7*  HEPARINUNFRC  --    < > 0.38 0.28* 0.49 0.51 0.39  CREATININE  --   --  1.39* 1.53*  --   --   --    < > = values in this interval not displayed.   Estimated Creatinine Clearance: 56.2 mL/min (A) (by C-G formula based on SCr of 1.53 mg/dL (H)).  Medical History: Past Medical History:  Diagnosis Date   Bicuspid aortic valve    a. s/p #27 Carbomedics mechanical valve on 03/25/2010; b. on Coumadin; c. TTE 12/17: EF 40-45%, moderately dilated LV with moderate LVH, AVR well-seated with 14 mmHg gradient, peak AV velocity 2.5 m/s, mild mitral valve thickening with mild MR, mildly dilated RV with mildly reduced contraction   Cellulitis    Chronic kidney disease    Chronic systolic CHF (congestive heart failure) (HCC)    a. R/LHC 03/2010 showed no significant CAD, LVEDP 31 mmHg, mean AoV gradient 34 mmHg at rest and 47 mmHg with dobutamine 20 mcg/kg/min, AVA 1.0 cm^2, RA 31, RV 68/25, PA 68/47, PCWP 38. PA sat 65%. CO 6.2 L/min (Fick) and 5.3 L/min (thermodilution)   Clotting disorder (HCC)    H/O mechanical aortic valve replacement 03/25/2010   a. #27 Carbomedics mechanical valve   Hypercholesterolemia    Renal infarct [facility]) 2017   Multiple right renal infarcts, likely embolic.   Stroke [facility])    TIA (transient ischemic attack) 05/2014   Assessment: 67 y/o male presenting with  palpitations. PMH significant for atrial fibrillation and mechanical aortic valve replacement in 03/2010 (on warfarin PTA). Pharmacy has been consulted to initiate heparin infusion given that patient may need heart catheterization. CHADS2VASc 5 (HTN, DM, CAD, Age, HTN).   Per anticoagulation clinic notes:  Home warfarin regimen is 7.5 mg on Tuesdays and Fridays, 4 mg all other days PTA INR goal is 2.5-3.5  Per patient: last warfarin dose in evening of 12/15; pt cannot remember what dose of warfarin he took.   Heparin level is therapeutic at 0.39 on UFH IV 2050 units/hour.  INR is subtherapeutic at 1.7 and trending positive on warfarin, previous doses: 12/19-7.5 mg, 12/20-7.5 mg. CBC is stable and no signs of bleeding.   Goal of Therapy:  Heparin level 0.3-0.7 units/ml INR goal 1.8-2.5 (pre-procedure for ICD) Monitor platelets by anticoagulation protocol: Yes  Plan:  Give reduced dose warfarin, 4 mg x1 Continue heparin infusion at 2050 units/hr unitl ICD implant F/u daily INR, HL, CBC, and signs of bleeding Planned ICD placement next week; once he is euvolemic and his INR is between 1.8 and 2.5 per EP note  Thank you  for involving pharmacy in this patient's care.   Wilmer Floor, PharmD PGY2 Cardiology Pharmacy Resident 06/23/2023 12:36 PM

## 2023-06-23 NOTE — Progress Notes (Signed)
Advanced Heart Failure Rounding Note  PCP-Cardiologist: Yvonne Kendall, MD   Subjective:     Patient overall doing well, reports that he has been ambulating with minimal symptoms. Started on heparin gtt for subtherapeutic INR. Awaiting ICD placement.    Objective:   Weight Range: 119.4 kg Body mass index is 42.49 kg/m.   Vital Signs:   Temp:  [98 F (36.7 C)-98.8 F (37.1 C)] 98.3 F (36.8 C) (12/21 1121) Pulse Rate:  [73-100] 93 (12/21 1121) Resp:  [15-24] 18 (12/21 1121) BP: (88-131)/(56-97) 110/97 (12/21 1121) SpO2:  [93 %-100 %] 94 % (12/21 1121) Weight:  [119.4 kg-120.7 kg] 119.4 kg (12/21 0620) Last BM Date : 06/22/23  Weight change: Filed Weights   06/22/23 1904 06/23/23 0620  Weight: 120.7 kg 119.4 kg    Intake/Output:   Intake/Output Summary (Last 24 hours) at 06/23/2023 1247 Last data filed at 06/23/2023 1308 Gross per 24 hour  Intake 240 ml  Output --  Net 240 ml      Physical Exam    General:  obese HEENT: NCAT Cor:  Regular rate & rhythm. No rubs, gallops or murmurs. JVP mildly elevated. Trace LE edema Lungs: Normal work of breathing, clear to auscultation bilaterally Abdomen: soft, nontender, nondistended Extremities: no cyanosis, clubbing, rash Neuro: alert & orientedx3, cranial nerves grossly intact. moves all 4 extremities w/o difficulty. Affect pleasant Vascular: 2+ radial pulses    Telemetry   Atrial fibrillation with rates 100s  Patient Profile   Usiel Zuege is a 67 y.o. male with PMH of AS s/p 27mm carbomerics mechanical AVR in 2016, non-obstrurctive CAD, TIA, acute on chronic systolic heart failure ascending aortic aneurysm who presents with sustained VT, transferred for ICD placement.   Assessment/Plan   Acute on chronic systolic heart failure  -  Echocardiogram with LVEF 25% and preserved RV function in the setting of volume overload. Severe TR.  - Cardiac MRI will be obtained to evaluate for scar burden / LGE;  apex appears to be thin and akinetic on contrasted echo imaging.   - Based on recent VT and worsening of LVEF, concern that Mr. Emrick may require advanced therapies in the future; high risk due to aortic aneurysm/mechanical aortic valve. His only support at this time is his girlfriend.  -Improvement in volume status, start torsemide 40mg  daily   Ventricular tachycardia - Likely secondary to decompensated heart failure with severely elevated filling pressures and reduced cardiac index.  - No VT overnight.  - EP following. ICD placement pending INR and getting heparin gtt off  Mechanical AVR - [employer] 27mm valve placed in 2011 for bicuspid valve disease - Continue heparin gtt - Continue warfarin; will need therapeutic INR prior to placement of ICD.  - Discussed warfarin dosing with pharmD.    Atrial fibrillation - Rhythm after cardioversion. Appears to have been sinus rhythm in the past, reasonable to obtain cardioversion after repeat amiodarone load - TSH 1.8; within normal limits.  - On Heparin drip. INR 1.7   Tobacco use - Reports that he has started smoking again.  - Will place nicotine patch; discussed with pharmD.    Aortic aneurysm - Followed by Dr. Laneta Simmers now - 5.7cm fusiform ascending aortic aneurysm; high risk for re-do surgery. High risk for advanced therapies.    Length of Stay: 1  Romie Minus, MD  06/23/2023, 12:47 PM  Advanced Heart Failure Team Pager [phone] (M-F; 7a - 5p)  Please contact [facility] for night-coverage after hours (5p -  7a ) and weekends on [email or web address]

## 2023-06-23 NOTE — Plan of Care (Signed)
  Problem: Education: Goal: Knowledge of General Education information will improve Description: Including pain rating scale, medication(s)/side effects and non-pharmacologic comfort measures Outcome: Progressing   Problem: Health Behavior/Discharge Planning: Goal: Ability to manage health-related needs will improve Outcome: Progressing   Problem: Activity: Goal: Risk for activity intolerance will decrease Outcome: Progressing   

## 2023-06-24 ENCOUNTER — Other Ambulatory Visit: Payer: Self-pay

## 2023-06-24 ENCOUNTER — Encounter (HOSPITAL_COMMUNITY): Payer: Self-pay | Admitting: Cardiology

## 2023-06-24 DIAGNOSIS — I5023 Acute on chronic systolic (congestive) heart failure: Secondary | ICD-10-CM | POA: Diagnosis not present

## 2023-06-24 LAB — BASIC METABOLIC PANEL
Anion gap: 16 — ABNORMAL HIGH (ref 5–15)
BUN: 40 mg/dL — ABNORMAL HIGH (ref 8–23)
CO2: 22 mmol/L (ref 22–32)
Calcium: 10.2 mg/dL (ref 8.9–10.3)
Chloride: 98 mmol/L (ref 98–111)
Creatinine, Ser: 1.88 mg/dL — ABNORMAL HIGH (ref 0.61–1.24)
GFR, Estimated: 38 mL/min — ABNORMAL LOW (ref >60)
Glucose, Bld: 103 mg/dL — ABNORMAL HIGH (ref 70–99)
Potassium: 3.8 mmol/L (ref 3.5–5.1)
Sodium: 136 mmol/L (ref 135–145)

## 2023-06-24 LAB — HEPARIN LEVEL (UNFRACTIONATED)
Heparin Unfractionated: 0.6 [IU]/mL (ref 0.30–0.70)
Heparin Unfractionated: 0.56 [IU]/mL (ref 0.30–0.70)

## 2023-06-24 LAB — PROTIME-INR
INR: 2.1 — ABNORMAL HIGH (ref 0.8–1.2)
Prothrombin Time: 23.6 s — ABNORMAL HIGH (ref 11.4–15.2)

## 2023-06-24 LAB — CBC
HCT: 45.2 % (ref 39.0–52.0)
Hemoglobin: 14.6 g/dL (ref 13.0–17.0)
MCH: 27.5 pg (ref 26.0–34.0)
MCHC: 32.3 g/dL (ref 30.0–36.0)
MCV: 85.1 fL (ref 80.0–100.0)
Platelets: 259 10*3/uL (ref 150–400)
RBC: 5.31 MIL/uL (ref 4.22–5.81)
RDW: 15.9 % — ABNORMAL HIGH (ref 11.5–15.5)
WBC: 9 10*3/uL (ref 4.0–10.5)
nRBC: 0 % (ref 0.0–0.2)

## 2023-06-24 LAB — MAGNESIUM: Magnesium: 2.2 mg/dL (ref 1.7–2.4)

## 2023-06-24 MED ORDER — POTASSIUM CHLORIDE CRYS ER 20 MEQ PO TBCR
20.0000 meq | EXTENDED_RELEASE_TABLET | Freq: Once | ORAL | Status: AC
  Administered 2023-06-24: 20 meq via ORAL
  Filled 2023-06-24: qty 1

## 2023-06-24 MED ORDER — AMIODARONE HCL 200 MG PO TABS
400.0000 mg | ORAL_TABLET | Freq: Two times a day (BID) | ORAL | Status: DC
  Administered 2023-06-24 – 2023-06-26 (×5): 400 mg via ORAL
  Filled 2023-06-24 (×5): qty 2

## 2023-06-24 MED ORDER — WARFARIN SODIUM 2 MG PO TABS
2.0000 mg | ORAL_TABLET | Freq: Once | ORAL | Status: AC
  Administered 2023-06-24: 2 mg via ORAL
  Filled 2023-06-24: qty 1

## 2023-06-24 MED ORDER — HEPARIN (PORCINE) 25000 UT/250ML-% IV SOLN
1950.0000 [IU]/h | INTRAVENOUS | Status: DC
  Administered 2023-06-24 (×2): 1950 [IU]/h via INTRAVENOUS
  Filled 2023-06-24: qty 250

## 2023-06-24 MED ORDER — AMIODARONE HCL 200 MG PO TABS
400.0000 mg | ORAL_TABLET | Freq: Every day | ORAL | Status: DC
Start: 2023-06-28 — End: 2023-06-26

## 2023-06-24 NOTE — Progress Notes (Signed)
PHARMACY - ANTICOAGULATION CONSULT NOTE  Pharmacy Consult for heparin + warfarin Indication: atrial fibrillation + mechanical valve AVR  Patient Measurements: Height: 5\' 6"  (167.6 cm) Weight: 118.3 kg (260 lb 12.9 oz) IBW/kg (Calculated) : 63.8 Heparin Dosing Weight: 91.2 kg  Labs: Recent Labs    06/22/23 0508 06/22/23 1438 06/23/23 0206 06/24/23 0207 06/24/23 1914  HGB 12.4*  --   --  14.6  --   HCT 38.4*  --   --  45.2  --   PLT 190  --   --  259  --   LABPROT 18.8*  --  20.1* 23.6*  --   INR 1.6*  --  1.7* 2.1*  --   HEPARINUNFRC 0.28*   < > 0.39 0.60 0.56  CREATININE 1.53*  --   --  1.88*  --    < > = values in this interval not displayed.   Estimated Creatinine Clearance: 45.5 mL/min (A) (by C-G formula based on SCr of 1.88 mg/dL (H)).  Medical History: Past Medical History:  Diagnosis Date   Bicuspid aortic valve    a. s/p #27 Carbomedics mechanical valve on 03/25/2010; b. on Coumadin; c. TTE 12/17: EF 40-45%, moderately dilated LV with moderate LVH, AVR well-seated with 14 mmHg gradient, peak AV velocity 2.5 m/s, mild mitral valve thickening with mild MR, mildly dilated RV with mildly reduced contraction   Cellulitis    Chronic kidney disease    Chronic systolic CHF (congestive heart failure) (HCC)    a. R/LHC 03/2010 showed no significant CAD, LVEDP 31 mmHg, mean AoV gradient 34 mmHg at rest and 47 mmHg with dobutamine 20 mcg/kg/min, AVA 1.0 cm^2, RA 31, RV 68/25, PA 68/47, PCWP 38. PA sat 65%. CO 6.2 L/min (Fick) and 5.3 L/min (thermodilution)   Clotting disorder (HCC)    H/O mechanical aortic valve replacement 03/25/2010   a. #27 Carbomedics mechanical valve   Hypercholesterolemia    Renal infarct [facility]) 2017   Multiple right renal infarcts, likely embolic.   Stroke [facility])    TIA (transient ischemic attack) 05/2014   Assessment: 67 y/o male presenting with palpitations. PMH significant for atrial fibrillation and mechanical aortic valve replacement in 03/2010  (on warfarin PTA). Pharmacy has been consulted to initiate heparin infusion given that patient may need heart catheterization. CHADS2VASc 5 (HTN, DM, CAD, Age, HTN).   Per anticoagulation clinic notes:  Home warfarin regimen is 7.5 mg on Tuesdays and Fridays, 4 mg all other days PTA INR goal is 2.5-3.5  Heparin level is on higher end of therapeutic goal at 0.60 (+0.21) on UFH IV 2050 units/hour.  INR is therapeutic at 2.1 (+0.4) and trending positive on warfarin, previous doses: 12/19-7.5 mg, 12/20-7.5 mg, 12/21-4mg . CBC is stable and no signs of bleeding.  Goal of Therapy:  Heparin level 0.3-0.7 units/ml INR goal 1.8-2.5 (pre-procedure for ICD) Monitor platelets by anticoagulation protocol: Yes  Plan:  Give reduced dose warfarin, 2 mg x1, to target INR 1.8-2.5 Reduce heparin infusion at 1950 units/hr until ICD implant F/u 8-hour HL F/u daily INR, HL, CBC, and signs of bleeding Planned ICD placement next week; once he is euvolemic and his INR is between 1.8 and 2.5 per EP note  Thank you for involving pharmacy in this patient's care.   Wilmer Floor, PharmD PGY2 Cardiology Pharmacy Resident 06/24/2023 7:42 PM  _____  12/22 PM Update: heparin level 0.56 therapeutic on 1950 units/hr. Received warfarin 2 mg x1.  No issues noted.   PLAN: continue heparin @  1950 units/hr, f/u HL with AM labs  Trixie Rude, PharmD Clinical Pharmacist 06/24/2023  7:42 PM

## 2023-06-24 NOTE — Plan of Care (Signed)
  Problem: Clinical Measurements: Goal: Ability to maintain clinical measurements within normal limits will improve Outcome: Progressing   Problem: Clinical Measurements: Goal: Will remain free from infection Outcome: Progressing   Problem: Health Behavior/Discharge Planning: Goal: Ability to manage health-related needs will improve Outcome: Progressing   Problem: Clinical Measurements: Goal: Diagnostic test results will improve Outcome: Progressing

## 2023-06-24 NOTE — Progress Notes (Signed)
Advanced Heart Failure Rounding Note  PCP-Cardiologist: Yvonne Kendall, MD   Subjective:     No acute events overnight, still feels well with ambulation.  Mild increase in creatinine and given an improvement in volume status will hold diuretics today.  Objective:   Weight Range: 118.3 kg Body mass index is 42.09 kg/m.   Vital Signs:   Temp:  [98.1 F (36.7 C)-98.6 F (37 C)] 98.1 F (36.7 C) (12/22 0815) Pulse Rate:  [73-142] 96 (12/22 0815) Resp:  [15-20] 15 (12/22 0815) BP: (96-128)/(68-97) 110/97 (12/22 0815) SpO2:  [92 %-96 %] 94 % (12/22 0815) Weight:  [118.3 kg] 118.3 kg (12/22 0328) Last BM Date : 06/22/23  Weight change: Filed Weights   06/22/23 1904 06/23/23 0620 06/24/23 0328  Weight: 120.7 kg 119.4 kg 118.3 kg    Intake/Output:   Intake/Output Summary (Last 24 hours) at 06/24/2023 1118 Last data filed at 06/24/2023 0907 Gross per 24 hour  Intake 360 ml  Output --  Net 360 ml      Physical Exam    General:  obese HEENT: NCAT Cor: Tachycardic, regular rhythm, systolic murmur. JVP mildly elevated. Trace LE edema Lungs: Normal work of breathing, clear to auscultation bilaterally Abdomen: soft, nontender, nondistended Extremities: no cyanosis, clubbing, rash Neuro: alert & orientedx3, cranial nerves grossly intact. moves all 4 extremities w/o difficulty. Affect pleasant Vascular: 2+ radial pulses    Telemetry   Atrial fibrillation with rates 100s  Patient Profile   Roberto [name] is a 67 y.o. male with PMH of AS s/p 27mm carbomerics mechanical AVR in 2016, non-obstrurctive CAD, TIA, acute on chronic systolic heart failure ascending aortic aneurysm who presents with sustained VT, transferred for ICD placement.   Assessment/Plan   Acute on chronic systolic heart failure  -  Echocardiogram with LVEF 25% and preserved RV function in the setting of volume overload. Severe TR.  - Cardiac MRI will be obtained to evaluate for scar burden /  LGE; apex appears to be thin and akinetic on contrasted echo imaging.   - Based on recent VT and worsening of LVEF, concern that Roberto [name] may require advanced therapies in the future; high risk due to aortic aneurysm/mechanical aortic valve. His only support at this time is his girlfriend.  -Improvement in volume status, holding torsemide today   Ventricular tachycardia - Likely secondary to decompensated heart failure with severely elevated filling pressures and reduced cardiac index.  - No VT overnight.  - EP following. ICD placement pending INR and getting heparin gtt off  Mechanical AVR - [employer] 27mm valve placed in 2011 for bicuspid valve disease - Continue heparin gtt - Continue warfarin; will need therapeutic INR prior to placement of ICD.  -His INR goal appears to be 2.5-3.5, unclear why the increase from typical goal of 2-3.  Will remain on heparin now, but will aim for INR 2-2.5 for ICD placement - Discussed warfarin dosing with pharmD.    Atrial fibrillation - Rhythm after electrical cardioversion. Appears to have been sinus rhythm in the past, reasonable to obtain cardioversion after repeat amiodarone load - TSH 1.8; within normal limits.  - On Heparin drip. INR 2.1   Tobacco use - Reports that he has started smoking again.  - Will place nicotine patch; discussed with pharmD.    Aortic aneurysm - Followed by Dr. Laneta Simmers now - 5.7cm fusiform ascending aortic aneurysm; high risk for re-do surgery. High risk for advanced therapies.    Length of Stay: 2  Romie Minus, MD  06/24/2023, 11:18 AM  Advanced Heart Failure Team Pager [phone] (M-F; 7a - 5p)  Please contact [facility] for night-coverage after hours (5p -7a ) and weekends on [email or web address]

## 2023-06-24 NOTE — Progress Notes (Signed)
PHARMACY - ANTICOAGULATION CONSULT NOTE  Pharmacy Consult for heparin + warfarin Indication: atrial fibrillation + mechanical valve AVR  Patient Measurements: Height: 5\' 6"  (167.6 cm) Weight: 118.3 kg (260 lb 12.9 oz) IBW/kg (Calculated) : 63.8 Heparin Dosing Weight: 91.2 kg  Labs: Recent Labs    06/22/23 0508 06/22/23 1438 06/22/23 2114 06/23/23 0206 06/24/23 0207  HGB 12.4*  --   --   --  14.6  HCT 38.4*  --   --   --  45.2  PLT 190  --   --   --  259  LABPROT 18.8*  --   --  20.1* 23.6*  INR 1.6*  --   --  1.7* 2.1*  HEPARINUNFRC 0.28*   < > 0.51 0.39 0.60  CREATININE 1.53*  --   --   --  1.88*   < > = values in this interval not displayed.   Estimated Creatinine Clearance: 45.5 mL/min (A) (by C-G formula based on SCr of 1.88 mg/dL (H)).  Medical History: Past Medical History:  Diagnosis Date   Bicuspid aortic valve    a. s/p #27 Carbomedics mechanical valve on 03/25/2010; b. on Coumadin; c. TTE 12/17: EF 40-45%, moderately dilated LV with moderate LVH, AVR well-seated with 14 mmHg gradient, peak AV velocity 2.5 m/s, mild mitral valve thickening with mild MR, mildly dilated RV with mildly reduced contraction   Cellulitis    Chronic kidney disease    Chronic systolic CHF (congestive heart failure) (HCC)    a. R/LHC 03/2010 showed no significant CAD, LVEDP 31 mmHg, mean AoV gradient 34 mmHg at rest and 47 mmHg with dobutamine 20 mcg/kg/min, AVA 1.0 cm^2, RA 31, RV 68/25, PA 68/47, PCWP 38. PA sat 65%. CO 6.2 L/min (Fick) and 5.3 L/min (thermodilution)   Clotting disorder (HCC)    H/O mechanical aortic valve replacement 03/25/2010   a. #27 Carbomedics mechanical valve   Hypercholesterolemia    Renal infarct [facility]) 2017   Multiple right renal infarcts, likely embolic.   Stroke [facility])    TIA (transient ischemic attack) 05/2014   Assessment: 67 y/o male presenting with palpitations. PMH significant for atrial fibrillation and mechanical aortic valve replacement in 03/2010  (on warfarin PTA). Pharmacy has been consulted to initiate heparin infusion given that patient may need heart catheterization. CHADS2VASc 5 (HTN, DM, CAD, Age, HTN).   Per anticoagulation clinic notes:  Home warfarin regimen is 7.5 mg on Tuesdays and Fridays, 4 mg all other days PTA INR goal is 2.5-3.5  Heparin level is on higher end of therapeutic goal at 0.60 (+0.21) on UFH IV 2050 units/hour.  INR is therapeutic at 2.1 (+0.4) and trending positive on warfarin, previous doses: 12/19-7.5 mg, 12/20-7.5 mg, 12/21-4mg . CBC is stable and no signs of bleeding.  Goal of Therapy:  Heparin level 0.3-0.7 units/ml INR goal 1.8-2.5 (pre-procedure for ICD) Monitor platelets by anticoagulation protocol: Yes  Plan:  Give reduced dose warfarin, 2 mg x1, to target INR 1.8-2.5 Reduce heparin infusion at 1950 units/hr until ICD implant F/u 8-hour HL F/u daily INR, HL, CBC, and signs of bleeding Planned ICD placement next week; once he is euvolemic and his INR is between 1.8 and 2.5 per EP note  Thank you for involving pharmacy in this patient's care.   Wilmer Floor, PharmD PGY2 Cardiology Pharmacy Resident 06/24/2023 10:46 AM

## 2023-06-25 ENCOUNTER — Other Ambulatory Visit: Payer: Self-pay

## 2023-06-25 ENCOUNTER — Inpatient Hospital Stay (HOSPITAL_COMMUNITY): Payer: 59

## 2023-06-25 ENCOUNTER — Inpatient Hospital Stay (HOSPITAL_COMMUNITY)
Admission: EM | Disposition: A | Discharge status: Home / Self Care | Payer: Self-pay | Source: Other Acute Inpatient Hospital | Attending: Cardiology

## 2023-06-25 DIAGNOSIS — I472 Ventricular tachycardia, unspecified: Secondary | ICD-10-CM | POA: Diagnosis not present

## 2023-06-25 DIAGNOSIS — I429 Cardiomyopathy, unspecified: Secondary | ICD-10-CM

## 2023-06-25 DIAGNOSIS — I5023 Acute on chronic systolic (congestive) heart failure: Secondary | ICD-10-CM

## 2023-06-25 HISTORY — PX: ICD IMPLANT: EP1208

## 2023-06-25 LAB — HEPARIN LEVEL (UNFRACTIONATED): Heparin Unfractionated: 0.59 [IU]/mL (ref 0.30–0.70)

## 2023-06-25 LAB — CBC
HCT: 43.7 % (ref 39.0–52.0)
Hemoglobin: 14.2 g/dL (ref 13.0–17.0)
MCH: 27.9 pg (ref 26.0–34.0)
MCHC: 32.5 g/dL (ref 30.0–36.0)
MCV: 85.9 fL (ref 80.0–100.0)
Platelets: 255 10*3/uL (ref 150–400)
RBC: 5.09 MIL/uL (ref 4.22–5.81)
RDW: 15.8 % — ABNORMAL HIGH (ref 11.5–15.5)
WBC: 8.1 10*3/uL (ref 4.0–10.5)
nRBC: 0 % (ref 0.0–0.2)

## 2023-06-25 LAB — BASIC METABOLIC PANEL
Anion gap: 11 (ref 5–15)
BUN: 35 mg/dL — ABNORMAL HIGH (ref 8–23)
CO2: 22 mmol/L (ref 22–32)
Calcium: 9.7 mg/dL (ref 8.9–10.3)
Chloride: 103 mmol/L (ref 98–111)
Creatinine, Ser: 1.63 mg/dL — ABNORMAL HIGH (ref 0.61–1.24)
GFR, Estimated: 46 mL/min — ABNORMAL LOW (ref >60)
Glucose, Bld: 102 mg/dL — ABNORMAL HIGH (ref 70–99)
Potassium: 3.9 mmol/L (ref 3.5–5.1)
Sodium: 136 mmol/L (ref 135–145)

## 2023-06-25 LAB — MAGNESIUM: Magnesium: 2.1 mg/dL (ref 1.7–2.4)

## 2023-06-25 LAB — PROTIME-INR
INR: 2.2 — ABNORMAL HIGH (ref 0.8–1.2)
Prothrombin Time: 24.2 s — ABNORMAL HIGH (ref 11.4–15.2)

## 2023-06-25 SURGERY — ICD IMPLANT

## 2023-06-25 MED ORDER — HEPARIN (PORCINE) IN NACL 1000-0.9 UT/500ML-% IV SOLN
INTRAVENOUS | Status: DC | PRN
  Administered 2023-06-25: 500 mL

## 2023-06-25 MED ORDER — SODIUM CHLORIDE 0.9 % IV SOLN
INTRAVENOUS | Status: AC
  Filled 2023-06-25: qty 2

## 2023-06-25 MED ORDER — SODIUM CHLORIDE 0.9 % IV SOLN: INTRAVENOUS | Status: DC

## 2023-06-25 MED ORDER — SODIUM CHLORIDE 0.9 % IV SOLN
80.0000 mg | INTRAVENOUS | Status: AC
  Administered 2023-06-25: 80 mg
  Filled 2023-06-25: qty 2

## 2023-06-25 MED ORDER — CEFAZOLIN SODIUM-DEXTROSE 1-4 GM/50ML-% IV SOLN
1.0000 g | Freq: Four times a day (QID) | INTRAVENOUS | Status: AC
  Administered 2023-06-25 – 2023-06-26 (×3): 1 g via INTRAVENOUS
  Filled 2023-06-25 (×3): qty 50

## 2023-06-25 MED ORDER — LIDOCAINE HCL (PF) 1 % IJ SOLN
INTRAMUSCULAR | Status: DC | PRN
  Administered 2023-06-25: 60 mL

## 2023-06-25 MED ORDER — CEFAZOLIN SODIUM-DEXTROSE 2-4 GM/100ML-% IV SOLN
INTRAVENOUS | Status: AC
  Filled 2023-06-25: qty 100

## 2023-06-25 MED ORDER — FENTANYL CITRATE (PF) 100 MCG/2ML IJ SOLN
INTRAMUSCULAR | Status: DC | PRN
  Administered 2023-06-25: 12.5 ug via INTRAVENOUS
  Administered 2023-06-25: 25 ug via INTRAVENOUS

## 2023-06-25 MED ORDER — CEFAZOLIN SODIUM-DEXTROSE 2-4 GM/100ML-% IV SOLN
2.0000 g | INTRAVENOUS | Status: AC
Start: 2023-06-25 — End: 2023-06-25
  Administered 2023-06-25: 2 g via INTRAVENOUS

## 2023-06-25 MED ORDER — MIDAZOLAM HCL 5 MG/5ML IJ SOLN
INTRAMUSCULAR | Status: AC
Start: 2023-06-25 — End: ?
  Filled 2023-06-25: qty 5

## 2023-06-25 MED ORDER — FENTANYL CITRATE (PF) 100 MCG/2ML IJ SOLN
INTRAMUSCULAR | Status: AC
  Filled 2023-06-25: qty 2

## 2023-06-25 MED ORDER — GADOBUTROL 1 MMOL/ML IV SOLN
10.0000 mL | Freq: Once | INTRAVENOUS | Status: AC | PRN
  Administered 2023-06-25: 10 mL via INTRAVENOUS

## 2023-06-25 MED ORDER — WARFARIN SODIUM 4 MG PO TABS
4.0000 mg | ORAL_TABLET | Freq: Once | ORAL | Status: AC
  Administered 2023-06-25: 4 mg via ORAL
  Filled 2023-06-25: qty 1

## 2023-06-25 MED ORDER — LIDOCAINE HCL 1 % IJ SOLN
INTRAMUSCULAR | Status: AC
  Filled 2023-06-25: qty 60

## 2023-06-25 MED ORDER — CHLORHEXIDINE GLUCONATE 4 % EX SOLN
60.0000 mL | Freq: Once | CUTANEOUS | Status: AC
Start: 2023-06-25 — End: 2023-06-25
  Administered 2023-06-25: 4 via TOPICAL

## 2023-06-25 MED ORDER — MIDAZOLAM HCL 5 MG/5ML IJ SOLN
INTRAMUSCULAR | Status: DC | PRN
  Administered 2023-06-25 (×2): 1 mg via INTRAVENOUS
  Administered 2023-06-25: 2 mg via INTRAVENOUS
  Administered 2023-06-25: 1 mg via INTRAVENOUS

## 2023-06-25 MED ORDER — CHLORHEXIDINE GLUCONATE 4 % EX SOLN
60.0000 mL | Freq: Once | CUTANEOUS | Status: DC
Start: 2023-06-25 — End: 2023-06-25
  Filled 2023-06-25: qty 60

## 2023-06-25 MED ORDER — MIDAZOLAM HCL 5 MG/5ML IJ SOLN
INTRAMUSCULAR | Status: AC
  Filled 2023-06-25: qty 5

## 2023-06-25 SURGICAL SUPPLY — 7 items
CABLE SURGICAL S-101-97-12 (CABLE) ×1 IMPLANT
ICD GALLANT VR DF1 CDVRA500T (ICD Generator) IMPLANT
LEAD DURATA 7122-65CM (Lead) IMPLANT
MAT PREVALON FULL STRYKER (MISCELLANEOUS) IMPLANT
PAD DEFIB RADIO PHYSIO CONN (PAD) ×1 IMPLANT
SHEATH 7FR PRELUDE SNAP 13 (SHEATH) IMPLANT
TRAY PACEMAKER INSERTION (PACKS) ×1 IMPLANT

## 2023-06-25 NOTE — TOC Initial Note (Signed)
Transition of Care [facility]) - Initial/Assessment Note    Patient Details  Name: Roberto [name] MRN: [id number] Date of Birth: Nov 20, 1955  Transition of [facility]) CM/SW Contact:    Elliot Cousin, RN Phone Number: [phone] 06/25/2023, 12:48 PM  Clinical Narrative:                 HF TOC CM spoke to pt and caregiver, [name] at bedside. Caregiver states pt has hospital bed and rolling walker. States se would like a shower chair for home.  Caregiver wanted information on [employer] and how she can become a paid caregiver. Provided her with paperwork and contact number to call [employer].  Will continue to follow for dc needs.   Expected Discharge Plan: Home w Home Health Services Barriers to Discharge: Continued Medical Work up   Patient Goals and CMS Choice            Expected Discharge Plan and Services   Discharge Planning Services: CM Consult   Living arrangements for the past 2 months: Single Family Home                                      Prior Living Arrangements/Services Living arrangements for the past 2 months: Single Family Home   Patient language and need for interpreter reviewed:: Yes Do you feel safe going back to the place where you live?: Yes      Need for Family Participation in Patient Care: Yes (Comment) Care giver support system in place?: Yes (comment) Current home services: DME (hospital bed, rolling walker) Criminal Activity/Legal Involvement Pertinent to Current Situation/Hospitalization: No - Comment as needed  Activities of Daily Living   ADL Screening (condition at time of admission) Independently performs ADLs?: Yes (appropriate for developmental age) Is the patient deaf or have difficulty hearing?: No Does the patient have difficulty seeing, even when wearing glasses/contacts?: No Does the patient have difficulty concentrating, remembering, or making decisions?: No  Permission Sought/Granted Permission sought to share  information with : Case Manager, Family Supports, PCP Permission granted to share information with : Yes, Verbal Permission Granted  Share Information with NAME: Ander Purpura     Permission granted to share info w Relationship: friend  Permission granted to share info w Contact Information: [phone]  Emotional Assessment Appearance:: Appears stated age Attitude/Demeanor/Rapport: Engaged Affect (typically observed): Accepting Orientation: : Oriented to Self, Oriented to Place, Oriented to  Time, Oriented to Situation   Psych Involvement: No (comment)  Admission diagnosis:  Acute on chronic systolic CHF (congestive heart failure) (HCC) [I50.23] Patient Active Problem List   Diagnosis Date Noted   Acute on chronic systolic CHF (congestive heart failure) (HCC) 06/22/2023   Acute kidney injury superimposed on chronic kidney disease (HCC) 06/18/2023   Sustained ventricular tachycardia (HCC) 06/18/2023   Hypotension 06/18/2023   Atrial fibrillation with RVR (HCC) 06/17/2023   Depression 06/17/2023   Gout 06/17/2023   Type 2 diabetes mellitus with chronic kidney disease, without long-term current use of insulin (HCC) 06/17/2023   Tremor of left hand 05/29/2022   Aneurysm of ascending aorta without rupture (HCC) 02/17/2022   Anticoagulated on Coumadin 07/29/2021   Chronic kidney disease, stage 3a (HCC) 12/09/2020   Hyperlipidemia associated with type 2 diabetes mellitus (HCC) 12/09/2020   Coronary artery disease involving native coronary artery of native heart without angina pectoris 09/10/2019   Osteoarthritis of spine  with radiculopathy, lumbar region 11/21/2018   History of aortic stenosis 06/05/2018   Chronic gout without tophus 04/03/2018   Valvular heart disease 01/17/2018   Morbid obesity (HCC) 01/17/2018   Chronic systolic CHF (congestive heart failure) (HCC) 09/27/2016   Renal infarct (HCC) 05/01/2016   Type 2 diabetes mellitus with cardiac complication (HCC) 04/19/2016    Chronic bilateral low back pain without sciatica 04/19/2016   Thoracic aortic aneurysm without rupture (HCC) 03/16/2016   Aortic atherosclerosis (HCC) 03/16/2016   Nicotine dependence, cigarettes, uncomplicated 03/14/2016   NICM (nonischemic cardiomyopathy) (HCC) 03/02/2016   COPD (chronic obstructive pulmonary disease) (HCC) 09/09/2015   H/O mechanical aortic valve replacement 02/09/2015   Hypertension associated with diabetes (HCC) 02/09/2015   PCP:  [name], DO Pharmacy:   [id number] - [address] ([address]), [place] - [address] [place] ([place]) [place] [address] Phone: [phone] Fax: [phone]  [facility] [address] [place] [address] Phone: [phone] Fax: [phone]  [id number], [place] - [address] AT [facility] [address] [facility] [place] [address] Phone: [phone] Fax: [phone]     Social Drivers of Health (SDOH) Social History: SDOH Screenings   Food Insecurity: No Food Insecurity (06/23/2023)  Housing: Unknown (06/23/2023)  Transportation Needs: No Transportation Needs (06/23/2023)  Utilities: Not At Risk (06/23/2023)  Alcohol Screen: Low Risk  (08/21/2022)  Depression (PHQ2-9): High Risk (05/16/2023)  Financial Resource Strain: Low Risk  (08/21/2022)  Physical Activity: Inactive (08/21/2022)  Social Connections: Socially Isolated (08/21/2022)  Stress: No Stress Concern Present (08/21/2022)  Tobacco Use: Medium Risk (06/24/2023)   SDOH Interventions:     Readmission Risk Interventions    06/18/2023   10:33 AM  Readmission Risk Prevention Plan  Transportation Screening Complete  PCP or Specialist Appt within 3-5 Days Complete  HRI or Home Care Consult Complete  Social Work Consult for Recovery Care Planning/Counseling Patient refused  Palliative Care Screening Not Applicable  Medication Review [employer]) Complete

## 2023-06-25 NOTE — Discharge Instructions (Signed)
After Your ICD (Implantable Cardiac Defibrillator)   ACTIVITY Do not lift your arm above shoulder height for 1 week after your procedure. After 7 days, you may progress as below.  You should remove your sling 24 hours after your procedure, unless otherwise instructed by your provider.     Monday July 02, 2023  Tuesday July 03, 2023 Wednesday July 04, 2023 Thursday July 05, 2023   Do not lift, push, pull, or carry anything over 10 pounds with the affected arm until 6 weeks (Monday August 06, 2023 ) after your procedure.   You may drive AFTER your wound check, unless you have been told otherwise by your provider.   Ask your healthcare provider when you can go back to work   INCISION/Dressing You will need to continue your Coumadin as directed.   If large square, outer bandage is left in place, this can be removed after 24 hours from your procedure. Do not remove steri-strips or glue as below.   Monitor your defibrillator site for redness, swelling, and drainage. Call the device clinic at [phone] if you experience these symptoms or fever/chills.  If your incision is sealed with Steri-strips or staples, you may shower 7 days after your procedure or when told by your provider. Do not remove the steri-strips or let the shower hit directly on your site. You may wash around your site with soap and water.    If you were discharged in a sling, please do not wear this during the day more than 48 hours after your surgery unless otherwise instructed. This may increase the risk of stiffness and soreness in your shoulder.   Avoid lotions, ointments, or perfumes over your incision until it is well-healed.  You may use a hot tub or a pool AFTER your wound check appointment if the incision is completely closed.  Your ICD is designed to protect you from life threatening heart rhythms. Because of this, you may receive a shock.   1 shock with no symptoms:  Call the office during  business hours. 1 shock with symptoms (chest pain, chest pressure, dizziness, lightheadedness, shortness of breath, overall feeling unwell):  Call 911. If you experience 2 or more shocks in 24 hours:  Call 911. If you receive a shock, you should not drive for 6 months per the [place] DMV IF you receive appropriate therapy from your ICD.   ICD Alerts:  Some alerts are vibratory and others beep. These are NOT emergencies. Please call our office to let us know. If this occurs at night or on weekends, it can wait until the next business day. Send a remote transmission.  If your device is capable of reading fluid status (for heart failure), you will be offered monthly monitoring to review this with you.   DEVICE MANAGEMENT Remote monitoring is used to monitor your ICD from home. This monitoring is scheduled every 91 days by our office. It allows [place] to keep an eye on the functioning of your device to ensure it is working properly. You will routinely see your Electrophysiologist annually (more often if necessary).   You should receive your ID card for your new device in 4-8 weeks. Keep this card with you at all times once received. Consider wearing a medical alert bracelet or necklace.  Your ICD  may be MRI compatible. This will be discussed at your next office visit/wound check.  You should avoid contact with strong electric or magnetic fields.   Do not use amateur (ham) radio  equipment or electric (arc) welding torches. MP3 player headphones with magnets should not be used. Some devices are safe to use if held at least 12 inches (30 cm) from your defibrillator. These include power tools, lawn mowers, and speakers. If you are unsure if something is safe to use, ask your health care provider.  When using your cell phone, hold it to the ear that is on the opposite side from the defibrillator. Do not leave your cell phone in a pocket over the defibrillator.  You may safely use electric blankets, heating pads,  computers, and microwave ovens.  Call the office right away if: You have chest pain. You feel more than one shock. You feel more short of breath than you have felt before. You feel more light-headed than you have felt before. Your incision starts to open up.  This information is not intended to replace advice given to you by your health care provider. Make sure you discuss any questions you have with your health care provider.

## 2023-06-25 NOTE — Progress Notes (Addendum)
Advanced Heart Failure Rounding Note  PCP-Cardiologist: Roberto Kendall, MD   Chief complaint: Acute on chronic systolic CHF  Subjective:    Feeling well. No dyspnea, orthopnea, or lower extremity edema.   No recurrent VT overnight.   Discussed with EP, likely going for ICD today or tomorrow.   Objective:   Weight Range: 117.9 kg Body mass index is 41.97 kg/m.   Vital Signs:   Temp:  [98 F (36.7 C)-98.5 F (36.9 C)] 98.1 F (36.7 C) (12/23 0317) Pulse Rate:  [67-103] 80 (12/23 0317) Resp:  [15-20] 19 (12/23 0317) BP: (103-115)/(71-97) 115/88 (12/23 0317) SpO2:  [93 %-96 %] 96 % (12/23 0317) Weight:  [117.9 kg] 117.9 kg (12/23 0317) Last BM Date : 06/22/23  Weight change: Filed Weights   06/23/23 0620 06/24/23 0328 06/25/23 0317  Weight: 119.4 kg 118.3 kg 117.9 kg    Intake/Output:   Intake/Output Summary (Last 24 hours) at 06/25/2023 0759 Last data filed at 06/24/2023 1537 Gross per 24 hour  Intake 947.06 ml  Output --  Net 947.06 ml      Physical Exam  General:  Sitting on side of bed. No distress.  HEENT: normal Neck: supple. Jvp 7-8 Cor: PMI nondisplaced. Irregular rhythm. No rubs, gallops or murmurs. Lungs: clear Abdomen: obese, soft, nontender, nondistended.  Extremities: no cyanosis, clubbing, rash, edema Neuro: alert & orientedx3. moves all 4 extremities w/o difficulty. Affect pleasant     Telemetry   Afib 60s-70s  Patient Profile   Roberto [name] is a 67 y.o. male with PMH of AS s/p 27mm carbomerics mechanical AVR in 2016, non-obstrurctive CAD, TIA, acute on chronic systolic heart failure ascending aortic aneurysm. Presented with sustained V and acute on chronic systolic CHF, transferred for ICD placement.   Assessment/Plan   Acute on chronic systolic heart failure  - NICM - Echocardiogram with LVEF 25% and preserved RV function in the setting of volume overload. Severe TR.  - R/LHC with nonobstructive CAD, severely elevated  filling pressures, Fick CI 1.9. - Cardiac MRI this admit to evaluate for scar burden / LGE; apex appears to be thin and akinetic on contrasted echo imaging.   - Volume improved. Last dose of po Torsemide 12/20. Restart post ICD implant. - Restart Farxiga post ICD implant - Continue spiro 25 mg daily - No beta blocker with low-output - Based on recent VT and worsening of LVEF, suspect Roberto [name] may require advanced therapies in the future; high risk due to aortic aneurysm. His only support at this time is his girlfriend.    Ventricular tachycardia - Likely secondary to decompensated heart failure with severely elevated filling pressures and reduced cardiac index.  - No recurrence on telemetry overnight. - EP following. INR now therapeutic on Warfarin. ICD placement today or tomorrow. - On po amiodarone 400 BID.  Mechanical AVR - CarboMedics 27mm valve placed in 2011 for bicuspid valve disease - Continue heparin gtt - Continue warfarin; will need therapeutic INR prior to placement of ICD.  - His INR goal appears to be 2.5-3.5, unclear why the increase from typical goal of 2-3.  INR 2.2 today. - Discussed warfarin dosing with pharmD.    Atrial fibrillation - Appears new. - Rhythm after electrical cardioversion. Seems to have been sinus rhythm in the past, reasonable to obtain cardioversion after amiodarone load - TSH 1.8; within normal limits.  - On warfarin   Tobacco use - Reports that he has started smoking again.  - Recommend cessation  Aortic aneurysm - Followed by Dr. Laneta Simmers now - 5.7cm fusiform ascending aortic aneurysm; high risk for re-do surgery. High risk for advanced therapies.    Length of Stay: 3  FINCH, LINDSAY N, [name]  06/25/2023, 7:59 AM  Advanced Heart Failure Team Pager [phone] (M-F; 7a - 5p)  Please contact [facility] for night-coverage after hours (5p -7a ) and weekends on [email or web address]  Patient seen and examined with the above-signed Advanced  Practice Provider and/or Housestaff. I personally reviewed laboratory data, imaging studies and relevant notes. I independently examined the patient and formulated the important aspects of the plan. I have edited the note to reflect any of my changes or salient points. I have personally discussed the plan with the patient and/or family.  Feels ok today. Denies CP or SOB. No VT overnight on po amio. Still in AF. Rate controlled.   Volume status looks good  INR 2.2  General:  Sitting up in bed No resp difficulty HEENT: normal Neck: supple. no JVD. Carotids 2+ bilat; no bruits. No lymphadenopathy or thryomegaly appreciated. Cor: Irregular rate & rhythm. Mechanical s2 Lungs: clear Abdomen: soft, nontender, nondistended. No hepatosplenomegaly. No bruits or masses. Good bowel sounds. Extremities: no cyanosis, clubbing, rash, edema Neuro: alert & orientedx3, cranial nerves grossly intact. moves all 4 extremities w/o difficulty. Affect pleasant  D/w EP/ Plan for ICD today and probable DC-CV with DFTs.   Will need cMRI as outpatient unless it can be done prior to ICD.   Arvilla Meres, MD  12:30 PM

## 2023-06-25 NOTE — Plan of Care (Signed)

## 2023-06-25 NOTE — Progress Notes (Signed)
Heart Failure Navigator Progress Note  Assessed for Heart & Vascular TOC clinic readiness.  Patient does not meet criteria due to Advanced Heart Failure Team patient of Dr. Elwyn Lade.   Navigator will sign off at this time.   Rhae Hammock, BSN, Scientist, clinical (histocompatibility and immunogenetics) Only

## 2023-06-25 NOTE — Progress Notes (Signed)
Mobility Specialist Progress Note:    06/25/23 1100  Mobility  Activity Ambulated independently in hallway  Level of Assistance Modified independent, requires aide device or extra time  Assistive Device None  Distance Ambulated (ft) 500 ft  Activity Response Tolerated well  Mobility Referral Yes  Mobility visit 1 Mobility  Mobility Specialist Start Time (ACUTE ONLY) 0945  Mobility Specialist Stop Time (ACUTE ONLY) 0956  Mobility Specialist Time Calculation (min) (ACUTE ONLY) 11 min   Received pt in bed having no complaints and agreeable to mobility. Pt was asymptomatic throughout ambulation and returned to room w/o fault. Left on EOB w/ call bell in reach and all needs met.   Roberto [name] Mobility Specialist Please contact via Special educational needs teacher or Rehab office at [phone]

## 2023-06-25 NOTE — Progress Notes (Signed)
PHARMACY - ANTICOAGULATION CONSULT NOTE  Pharmacy Consult for heparin + warfarin Indication: atrial fibrillation + mechanical valve AVR  Patient Measurements: Height: 5\' 6"  (167.6 cm) Weight: 117.9 kg (260 lb) IBW/kg (Calculated) : 63.8 Heparin Dosing Weight: 91.2 kg  Labs: Recent Labs    06/23/23 0206 06/24/23 0207 06/24/23 1914 06/25/23 0154  HGB  --  14.6  --  14.2  HCT  --  45.2  --  43.7  PLT  --  259  --  255  LABPROT 20.1* 23.6*  --  24.2*  INR 1.7* 2.1*  --  2.2*  HEPARINUNFRC 0.39 0.60 0.56 0.59  CREATININE  --  1.88*  --  1.63*   Estimated Creatinine Clearance: 52.4 mL/min (A) (by C-G formula based on SCr of 1.63 mg/dL (H)).  Medical History: Past Medical History:  Diagnosis Date   Bicuspid aortic valve    a. s/p #27 Carbomedics mechanical valve on 03/25/2010; b. on Coumadin; c. TTE 12/17: EF 40-45%, moderately dilated LV with moderate LVH, AVR well-seated with 14 mmHg gradient, peak AV velocity 2.5 m/s, mild mitral valve thickening with mild MR, mildly dilated RV with mildly reduced contraction   Cellulitis    Chronic kidney disease    Chronic systolic CHF (congestive heart failure) (HCC)    a. R/LHC 03/2010 showed no significant CAD, LVEDP 31 mmHg, mean AoV gradient 34 mmHg at rest and 47 mmHg with dobutamine 20 mcg/kg/min, AVA 1.0 cm^2, RA 31, RV 68/25, PA 68/47, PCWP 38. PA sat 65%. CO 6.2 L/min (Fick) and 5.3 L/min (thermodilution)   Clotting disorder (HCC)    H/O mechanical aortic valve replacement 03/25/2010   a. #27 Carbomedics mechanical valve   Hypercholesterolemia    Renal infarct [facility]) 2017   Multiple right renal infarcts, likely embolic.   Stroke [facility])    TIA (transient ischemic attack) 05/2014   Assessment: 67 y/o male presenting with palpitations. PMH significant for atrial fibrillation and mechanical aortic valve replacement in 03/2010 (on warfarin PTA). Pharmacy has been consulted to initiate heparin infusion given that patient may need heart  catheterization. CHADS2VASc 5 (HTN, DM, CAD, Age, HTN).   Per anticoagulation clinic notes:  Home warfarin regimen is 7.5 mg on Tuesdays and Fridays, 4 mg all other days PTA INR goal is 2.5-3.5  Heparin level within goal range, but INR up to 2.2 today.  No overt bleeding or complications noted.   Goal of Therapy:  Heparin level 0.3-0.7 units/ml INR goal 1.8-2.5 (pre-procedure for ICD) Monitor platelets by anticoagulation protocol: Yes  Plan:  Warfarin 4 mg po x 1 tonight Daily INR.  Reece Leader, Colon Flattery, BCCP Clinical Pharmacist  06/25/2023 2:39 PM   [facility] pharmacy phone numbers are listed on [email or web address]

## 2023-06-25 NOTE — Discharge Summary (Signed)
Advanced Heart Failure Team  Discharge Summary   Patient ID: Roberto [name] MRN: [id number], DOB/AGE: [birth year] 67 y.o. Admit date: 06/22/2023 D/C date:     06/26/2023   Primary Discharge Diagnoses:  Acute on chronic systolic CHF Nonischemic cardiomyopathy Ventricular tachycardia Atrial fibrillation  Secondary Discharge Diagnoses:  Mechanical AVR Ascending aortic aneurysm Tobacco use  Hospital Course: 67 y.o. male with history of bicuspid aortic valve with aortic valve stenosis s/p mechanical AVR in 2011, ascending aortic aneurysm followed by TCTS, chronic systolic CHF/NICM, nonobstructive CAD, hx renal infarct in 2017, hx CVA.   Echo in 2020 with EF 40-45%.  Echo 2023: EF 35-40%, mechanical aortic valve okay   Presented to [facility] 06/17/23 with monomorphic VT. He converted to atrial fibrillation with IV amiodarone. R/LHC: nonobstructive CAD, severely elevated filling pressures and Fick CI 1.9. Echo with EF down to 25%, RV okay and severe TR. Advanced Heart Failure consulted. He was diuresed with IV lasix. EP consulted. He was transferred to [facility] for ICD placement prior to discharge from the hospital. Prior to placement cMRI was performed which showed EF 20% and mod LAE and severe RAE. Mild MR. Mod TR. Severe LVE and mild RVE. He underwent ICD insertion 06/25/23 and successful ATP through device, termination the VT. He remained in stable, rate controlled atrial fibrillation after procedure and has been scheduled for follow up and cardioversion with his cardiologist, Dr. Okey Dupre in [place]. Vitals and labs stable for discharge.   Follow-up arranged in Advanced Heart Failure clinic at [facility].  See below for hospital course by problem.  Acute on chronic systolic heart failure  - NICM - Echocardiogram with LVEF 25% and preserved RV function in the setting of volume overload. Sev TR.  - R/LHC with nonobstructive CAD, severely elevated filling pressures, Fick CI 1.9. - cMRI with EF  20% and mod LAE and severe RAE. Mild MR. Mod TR. Severe LVE and mild RVE. - Start Torsemide 40 mg daily - Plan to start Farxiga at follow up - Continue spiro 25 mg daily - No beta blocker with low-output - Based on recent VT and worsening of LVEF, suspect Mr. Kesten may require advanced therapies in the future; high risk due to aortic aneurysm. His only support at this time is his girlfriend.    2. Ventricular tachycardia - Likely secondary to decompensated heart failure with severely elevated filling pressures and reduced cardiac index. Chemically converted to Afib with IV Amio - EP following. S/p single lead ICD placement and successful ATP w termination of VT 06/25/23. - On amio 400 BID until 12/26 then 400 mg daily   3. Mechanical AVR - [employer] 27mm valve placed in 2011 for bicuspid valve disease - INR goal 2.5-3.5. INR 2.1 today. - Discussed warfarin dosing with pharmD.    4. Atrial fibrillation - rate controlled - On amio 400 bid, decrease to daily 12/26. - D/w EP about persistent afib, they recommend continuing amio and following with EP as outpatient - Outpatient CV scheduled with [facility] - On warfarin   5. Tobacco use - Reports that he has started smoking again.  - Recommend cessation   6. Aortic aneurysm - Followed by Dr. Laneta Simmers now - 5.7 cm fusiform ascending aortic aneurysm; high risk for re-do surgery. High risk for advanced therapies.   Discharge Weight Range: 260.8 lbs Discharge Vitals: Blood pressure 103/80, pulse 80, temperature 98.1 F (36.7 C), temperature source Oral, resp. rate 20, height 5\' 6"  (1.676 m), weight 118.3 kg, SpO2 96%.  Labs: Lab Results  Component Value Date   WBC 8.9 06/26/2023   HGB 13.7 06/26/2023   HCT 42.6 06/26/2023   MCV 86.1 06/26/2023   PLT 254 06/26/2023    Recent Labs  Lab 06/26/23 0209  NA 135  K 3.7  CL 101  CO2 22  BUN 30*  CREATININE 1.55*  CALCIUM 9.8  GLUCOSE 97   Lab Results   Component Value Date   CHOL 141 05/16/2023   HDL 43 05/16/2023   LDLCALC 65 05/16/2023   TRIG 200 (H) 05/16/2023   BNP (last 3 results) Recent Labs    06/17/23 2110  BNP 224.7*    ProBNP (last 3 results) No results for input(s): "PROBNP" in the last 8760 hours.   Diagnostic Studies/Procedures   DG Chest 2 View Result Date: 06/26/2023 CLINICAL DATA:  Cardiac defibrillator in situ. EXAM: CHEST - 2 VIEW COMPARISON:  Radiographs 06/17/2023 and 11/04/2018.  CT 02/14/2023. FINDINGS: New left subclavian AICD lead projects to the right ventricular apex. Stable cardiomegaly status post median sternotomy and aortic valve replacement. Mild aortic atherosclerosis noted. The lungs are clear. There is no pleural effusion or pneumothorax. The bones appear unchanged. IMPRESSION: New left subclavian AICD lead projects to the right ventricular apex. No pneumothorax or other complication. Electronically Signed   By: Carey Bullocks M.D.   On: 06/26/2023 12:17   EP PPM/ICD IMPLANT Result Date: 06/25/2023 CONCLUSIONS:  1. Ischemic cardiomyopathy with chronic New York Heart Association class III heart failure and VT.  2. Successful ICD implantation.  3. Successful ATP through the device, terminating the VT.  4. No early apparent complications. ICD Criteria Current LVEF:25%. Within 12 months prior to implant: Yes Heart failure history: Yes, Class II Cardiomyopathy history: Yes, Non-Ischemic Cardiomyopathy. Atrial Fibrillation/Atrial Flutter: Yes, Persistent (> 7 Days). Ventricular tachycardia history: Yes, Hemodynamic instability present. VT Type: Sustained Ventricular Tachycardia - Monomorphic. Cardiac arrest history: No. History of syndromes with risk of sudden death: No. Previous ICD: No. Current ICD indication: Secondary PPM indication: No.  Class I or II Bradycardia indication present: No Beta Blocker therapy for 3 or more months: Yes, prescribed. Ace Inhibitor/ARB therapy for 3 or more months: Yes,  prescribed. Lewayne Bunting, MD 5:47 PM 06/25/2023   MR CARDIAC VELOCITY FLOW MAP Result Date: 06/25/2023 CLINICAL DATA:  Cardiomyopathy with VT EXAM: CARDIAC MRI TECHNIQUE: The patient was scanned on a 1.5 Tesla Siemens magnet. A dedicated cardiac coil was used. Functional imaging was done using Fiesta sequences. 2,3, and 4 chamber views were done to assess for RWMA's. Modified Simpson's rule using a short axis stack was used to calculate an ejection fraction on a dedicated work [employer]. The patient received 10 cc of Gadavist. After 10 minutes inversion recovery sequences were used to assess for infiltration and scar tissue. CONTRAST:  Gadavist FINDINGS: Moderate LAE. Severe RAE. No PFO/ASD. No pericardial effusion. 27 mm mechanical bileaflet carbomedics AVR. Normal leaflet motion with mild closing volume AR. Severe ascending thoracic aorta dilatation 5.5 cm. No dissection. Does not appear to have had a Bentall procedure performed Mild MV leaflet thickening with mild appearing MR. Moderate appearing TR. Severe LVE with global hypokinesis Mild RVE with mild RV hypokinesis Quantitative LVEF:20%. (EDV 333 cc ESV 265 cc SV 68 cc) Estimated cardiac output 3.9 L/min Quantitative RVEF: 41% (EDV 215 cc ESV 126 cc SV 88 cc ) Delayed gadolinium images showed mild area of basal septal mid myocardiac uptake/scar Parametric Measures: Using Hct 43 T1 normal 1045 msec  ECV normal 26% T2 normal 46 msec IMPRESSION: 1.  Estimated cardiac output 3.9 L/min Charlton Haws Electronically Signed   By: Charlton Haws M.D.   On: 06/25/2023 17:47   MR CARDIAC MORPHOLOGY W WO CONTRAST Result Date: 06/25/2023 CLINICAL DATA:  Post AVR with Cardiomyopathy and VT EXAM: CARDIAC MRI TECHNIQUE: The patient was scanned on a 1.5 Tesla Siemens magnet. A dedicated cardiac coil was used. Functional imaging was done using Fiesta sequences. 2,3, and 4 chamber views were done to assess for RWMA's. Modified Simpson's rule using a  short axis stack was used to calculate an ejection fraction on a dedicated work [employer]. The patient received 10 cc of Gadavist. After 10 minutes inversion recovery sequences were used to assess for infiltration and scar tissue. CONTRAST:  Gadavist FINDINGS: Moderate LAE. Severe RAE. No PFO/ASD. No pericardial effusion. 27 mm mechanical bileaflet carbomedics AVR. Normal leaflet motion with mild closing volume AR. Severe ascending thoracic aorta dilatation 5.5 cm. No dissection. Does not appear to have had a Bentall procedure performed Mild MV leaflet thickening with mild appearing MR. Moderate appearing TR. Severe LVE with global hypokinesis Mild RVE with mild RV hypokinesis Quantitative LVEF:20%. (EDV 333 cc ESV 265 cc SV 68 cc) Estimated cardiac output 3.9 L/min Quantitative RVEF: 41% (EDV 215 cc ESV 126 cc SV 88 cc ) Delayed gadolinium images showed mild area of basal septal mid myocardiac uptake/scar Parametric Measures: Using Hct 43 T1 normal 1045 msec ECV normal 26% T2 normal 46 msec IMPRESSION: 1. Severe LVE with global hypokinesis LVEF 20% 2. Delayed gadolinium images with mid myocardial basal septal uptake not consistent with CAD 3.  Mild RVE with hypokinesis RVEF 41% 4.  Severe RAE and Moderate LAE 5. Severe ascending thoracic aneurysm measuring 5.5 cm Patient does not appear to have had Bentall procedure during his AVR Note non contrast chest CT done 03/17/23 measured this at 5.9 cm He has been seen by Dr Laneta Simmers CVTS on 02/14/23 and not thought to be a redo candidate due to co morbidities 6.  Normal parametric measures see values above 7. Bileaflet 27 mm AVR with carbomedics valve mild closing volume AR and normal leaflet motion 8.  Mild MV leaflet thickening with mild appearing MR 9.  Moderate appearing TR Charlton Haws Electronically Signed   By: Charlton Haws M.D.   On: 06/25/2023 17:45    Discharge Medications   Allergies as of 06/26/2023   No Known Allergies       Medication List     STOP taking these medications    amiodarone 360-4.14 MG/200ML-% Soln Commonly known as: NEXTERONE PREMIX   dapagliflozin propanediol 10 MG Tabs tablet Commonly known as: FARXIGA   furosemide 10 MG/ML injection Commonly known as: LASIX   furosemide 80 MG tablet Commonly known as: LASIX   heparin [address] UT/250ML infusion   lisinopril 2.5 MG tablet Commonly known as: ZESTRIL   metoprolol succinate 100 MG 24 hr tablet Commonly known as: TOPROL-XL       TAKE these medications    albuterol (2.5 MG/3ML) 0.083% nebulizer solution Commonly known as: PROVENTIL USE 1 VIAL IN NEBULIZER EVERY 6 HOURS AS NEEDED FOR WHEEZING FOR SHORTNESS OF BREATH   Ventolin HFA 108 (90 Base) MCG/ACT inhaler Generic drug: albuterol INHALE 2 PUFFS BY MOUTH EVERY 6 HOURS AS NEEDED FOR WHEEZING OR SHORTNESS OF BREATH   allopurinol 300 MG tablet Commonly known as: ZYLOPRIM Take 1 tablet (300 mg total) by mouth daily.   amiodarone  200 MG tablet Commonly known as: PACERONE Take 2 tablets (400 mg total) by mouth 2 (two) times daily for 2 days, THEN 2 tablets (400 mg total) daily. Start taking on: June 26, 2023   aspirin 81 MG chewable tablet Chew 81 mg by mouth daily.   buPROPion 150 MG 12 hr tablet Commonly known as: Wellbutrin SR Take 1 pill in the AM for 3 days, then increase to 1 pill BID. Pick a day in the 2nd week to quit What changed:  how much to take when to take this   fenofibrate 48 MG tablet Commonly known as: TRICOR Take 1 tablet (48 mg total) by mouth daily.   Fish Oil 1000 MG Caps Take 5,000 mg by mouth daily.   metFORMIN 500 MG 24 hr tablet Commonly known as: GLUCOPHAGE-XR Take 1 tablet (500 mg total) by mouth daily with breakfast.   nicotine 7 mg/24hr patch Commonly known as: Nicoderm CQ Place 1 patch (7 mg total) onto the skin daily.   nortriptyline 10 MG capsule Commonly known as: PAMELOR Take 1 capsule (10 mg total) by mouth at  bedtime.   potassium chloride SA 20 MEQ tablet Commonly known as: KLOR-CON M Take 1 tablet (20 mEq total) by mouth daily.   rosuvastatin 40 MG tablet Commonly known as: CRESTOR Take 1 tablet (40 mg total) by mouth daily.   Semaglutide (1 MG/DOSE) 4 MG/3ML Sopn Inject 1 mg as directed once a week. What changed: additional instructions   spironolactone 25 MG tablet Commonly known as: ALDACTONE Take 1 tablet (25 mg total) by mouth daily.   torsemide 20 MG tablet Commonly known as: DEMADEX Take 2 tablets (40 mg total) by mouth daily. Start taking on: June 27, 2023   Trelegy Ellipta 100-62.5-25 MCG/ACT Aepb Generic drug: Fluticasone-Umeclidin-Vilant Inhale 1 puff by mouth once daily   warfarin 4 MG tablet Commonly known as: COUMADIN Take as directed. If you are unsure how to take this medication, talk to your nurse or doctor. Original instructions: TAKE 1 TABLET BY MOUTH ONCE DAILY AT  6PM  AS  DIRECTED Start taking on: June 27, 2023 What changed: additional instructions   warfarin 7.5 MG tablet Commonly known as: COUMADIN Take as directed. If you are unsure how to take this medication, talk to your nurse or doctor. Original instructions: TAKE 4MG  BY MOUTH FOR 2 DAYS, THEN TAKE 7.5MG  ON THE THIRD DAY, THEN REPEAT Start taking on: June 27, 2023 What changed: Another medication with the same name was changed. Make sure you understand how and when to take each.        Disposition   The patient will be discharged in stable condition to home. Discharge Instructions     (HEART FAILURE PATIENTS) Call MD:  Anytime you have any of the following symptoms: 1) 3 pound weight gain in 24 hours or 5 pounds in 1 week 2) shortness of breath, with or without a dry hacking cough 3) swelling in the hands, feet or stomach 4) if you have to sleep on extra pillows at night in order to breathe.   Complete by: As directed    Diet - low sodium heart healthy   Complete by: As  directed    Heart Failure patients record your daily weight using the same scale at the same time of day   Complete by: As directed    Increase activity slowly   Complete by: As directed    STOP any activity that causes chest pain, shortness  of breath, dizziness, sweating, or exessive weakness   Complete by: As directed        Follow-up Information     [facility]. Go in 7 day(s).   Specialty: Cardiology Why: Post hospital follow-up. 07/03/23 @ 9:00AM Please bring all medications to follow-up appt. [facility] [place], [address] Floor Free Valet Parking Contact information: [address] [place] [address] [phone]        [facility] Follow up on 07/12/2023.   Specialty: Cardiology Why: At 8am w Dr. Laurell Roof information: [address], [address] [place] [address] [phone]                  Duration of Discharge Encounter: Greater than 35 minutes   Signed, [place] Lee  06/26/2023, 3:08 PM

## 2023-06-25 NOTE — Care Management Important Message (Signed)
Important Message  Patient Details  Name: Roberto [name] MRN: [id number] Date of Birth: 1955/08/17   Important Message Given:  Yes - Medicare IM     Dorena Bodo 06/25/2023, 2:48 PM

## 2023-06-25 NOTE — Plan of Care (Signed)
  Problem: Education: Goal: Knowledge of General Education information will improve Description: Including pain rating scale, medication(s)/side effects and non-pharmacologic comfort measures Outcome: Progressing   Problem: Health Behavior/Discharge Planning: Goal: Ability to manage health-related needs will improve Outcome: Progressing   Problem: Clinical Measurements: Goal: Ability to maintain clinical measurements within normal limits will improve Outcome: Progressing Goal: Will remain free from infection Outcome: Progressing Goal: Diagnostic test results will improve Outcome: Progressing Goal: Respiratory complications will improve Outcome: Progressing Goal: Cardiovascular complication will be avoided Outcome: Progressing   Problem: Activity: Goal: Risk for activity intolerance will decrease Outcome: Progressing   Problem: Nutrition: Goal: Adequate nutrition will be maintained Outcome: Progressing   Problem: Coping: Goal: Level of anxiety will decrease Outcome: Progressing   Problem: Elimination: Goal: Will not experience complications related to bowel motility Outcome: Progressing Goal: Will not experience complications related to urinary retention Outcome: Progressing   Problem: Pain Management: Goal: General experience of comfort will improve Outcome: Progressing   Problem: Safety: Goal: Ability to remain free from injury will improve Outcome: Progressing   Problem: Skin Integrity: Goal: Risk for impaired skin integrity will decrease Outcome: Progressing   Problem: Education: Goal: Ability to demonstrate management of disease process will improve Outcome: Progressing Goal: Ability to verbalize understanding of medication therapies will improve Outcome: Progressing Goal: Individualized Educational Video(s) Outcome: Progressing   Problem: Activity: Goal: Capacity to carry out activities will improve Outcome: Progressing   Problem: Cardiac: Goal:  Ability to achieve and maintain adequate cardiopulmonary perfusion will improve Outcome: Progressing   Problem: Education: Goal: Understanding of CV disease, CV risk reduction, and recovery process will improve Outcome: Progressing Goal: Individualized Educational Video(s) Outcome: Progressing   Problem: Activity: Goal: Ability to return to baseline activity level will improve Outcome: Progressing   Problem: Cardiovascular: Goal: Ability to achieve and maintain adequate cardiovascular perfusion will improve Outcome: Progressing Goal: Vascular access site(s) Level 0-1 will be maintained Outcome: Progressing   Problem: Health Behavior/Discharge Planning: Goal: Ability to safely manage health-related needs after discharge will improve Outcome: Progressing   Problem: Education: Goal: Knowledge of cardiac device and self-care will improve Outcome: Progressing Goal: Ability to safely manage health related needs after discharge will improve Outcome: Progressing Goal: Individualized Educational Video(s) Outcome: Progressing   Problem: Cardiac: Goal: Ability to achieve and maintain adequate cardiopulmonary perfusion will improve Outcome: Progressing   Problem: Education: Goal: Knowledge of cardiac device and self-care will improve Outcome: Progressing Goal: Ability to safely manage health related needs after discharge will improve Outcome: Progressing Goal: Individualized Educational Video(s) Outcome: Progressing   Problem: Cardiac: Goal: Ability to achieve and maintain adequate cardiopulmonary perfusion will improve Outcome: Progressing

## 2023-06-25 NOTE — Consult Note (Addendum)
ELECTROPHYSIOLOGY CONSULT NOTE    Patient ID: Derec Herz MRN: [id number], DOB/AGE: 68-Apr-1957 68 y.o.  Admit date: 06/22/2023 Date of Consult: 06/25/2023  Primary Physician: [name], DO Primary Cardiologist: Yvonne Kendall, MD  Electrophysiologist: new to Dr. Lalla Brothers     Patient Profile: Cuinn Bechel is a 67 y.o. male with a history of aortic stenosis s/p mechanical AVR, thoracic aortic aneurysm, HFrEF, Non-obs CAD who is being seen today for the evaluation of VT at the request of Dr. Elwyn Lade.   HPI:  Brandt Shellman is a 67 y.o. male with PMH as above presented to [facility] ER 12/15 with palpitations, dizziness, and SOB. His initial EKG was concerning for monomorphic VT. He was given IV amiodarone and converted to afib. His INR was elevated so R/LHC deferred until 12/18, which showed stable non-obs CAD. RHC with severely reduced CI and elevated filling pressures. Adv HF was consulted post-procedure for diuretic mgmt. He was tx to [facility] 12/20 for ongoing diuresis and ICD implant.  Since admission to [facility], he has continued to diurese. He was transitioned to PO amiodarone, continues on 400mg  BID.     He sees Dr. Laneta Simmers every 6 months to monitor his aortic aneurysm. He says that surgery is planned soon.     Labs Potassium3.9 (12/23 0154) Magnesium  2.1 (12/23 0154) Creatinine, ser  1.63* (12/23 0154) PLT  255 (12/23 0154) HGB  14.2 (12/23 0154) WBC 8.1 (12/23 0154)  .    Past Medical History:  Diagnosis Date   Bicuspid aortic valve    a. s/p #27 Carbomedics mechanical valve on 03/25/2010; b. on Coumadin; c. TTE 12/17: EF 40-45%, moderately dilated LV with moderate LVH, AVR well-seated with 14 mmHg gradient, peak AV velocity 2.5 m/s, mild mitral valve thickening with mild MR, mildly dilated RV with mildly reduced contraction   Cellulitis    Chronic kidney disease    Chronic systolic CHF (congestive heart failure) (HCC)    a. R/LHC 03/2010 showed no significant CAD,  LVEDP 31 mmHg, mean AoV gradient 34 mmHg at rest and 47 mmHg with dobutamine 20 mcg/kg/min, AVA 1.0 cm^2, RA 31, RV 68/25, PA 68/47, PCWP 38. PA sat 65%. CO 6.2 L/min (Fick) and 5.3 L/min (thermodilution)   Clotting disorder (HCC)    H/O mechanical aortic valve replacement 03/25/2010   a. #27 Carbomedics mechanical valve   Hypercholesterolemia    Renal infarct [facility]) 2017   Multiple right renal infarcts, likely embolic.   Stroke [facility])    TIA (transient ischemic attack) 05/2014     Surgical History:  Past Surgical History:  Procedure Laterality Date   AORTIC VALVE REPLACEMENT     CARDIAC CATHETERIZATION  03/21/2010   No significant CAD. Severe aortic stenosis. Severely elevated left and right heart filling pressures.   CARDIAC SURGERY  2009   CHF   CARPAL TUNNEL RELEASE Left 2005   RIGHT HEART CATH AND CORONARY ANGIOGRAPHY N/A 01/28/2019   Procedure: RIGHT HEART CATH AND CORONARY ANGIOGRAPHY;  Surgeon: Yvonne Kendall, MD;  Location: [facility] INVASIVE CV LAB;  Service: Cardiovascular;  Laterality: N/A;   RIGHT HEART CATH AND CORONARY ANGIOGRAPHY N/A 06/20/2023   Procedure: RIGHT HEART CATH AND CORONARY ANGIOGRAPHY;  Surgeon: Yvonne Kendall, MD;  Location: [facility] INVASIVE CV LAB;  Service: Cardiovascular;  Laterality: N/A;   TONSILLECTOMY  1962     Medications Prior to Admission  Medication Sig Dispense Refill Last Dose/Taking   allopurinol (ZYLOPRIM) 300 MG tablet Take 1 tablet (300 mg total) by mouth  daily. 90 tablet 1 Past Week   aspirin 81 MG chewable tablet Chew 81 mg by mouth daily.   Past Week   buPROPion (WELLBUTRIN SR) 150 MG 12 hr tablet Take 1 pill in the AM for 3 days, then increase to 1 pill BID. Pick a day in the 2nd week to quit (Patient taking differently: 150 mg 2 (two) times daily. Take 1 pill in the AM for 3 days, then increase to 1 pill BID. Pick a day in the 2nd week to quit) 60 tablet 3 Past Week   dapagliflozin propanediol (FARXIGA) 10 MG TABS tablet Take 1 tablet (10  mg total) by mouth daily. 90 tablet 3 Past Week   fenofibrate (TRICOR) 48 MG tablet Take 1 tablet (48 mg total) by mouth daily. 90 tablet 3 Past Week   [Paused] furosemide (LASIX) 80 MG tablet Take 1 tablet (80 mg total) by mouth daily. 90 tablet 3 Past Week   lisinopril (ZESTRIL) 2.5 MG tablet Take 1 tablet (2.5 mg total) by mouth daily. 90 tablet 3 Past Week   metFORMIN (GLUCOPHAGE-XR) 500 MG 24 hr tablet Take 1 tablet (500 mg total) by mouth daily with breakfast. 90 tablet 1 Past Week   metoprolol succinate (TOPROL-XL) 100 MG 24 hr tablet Take 1 tablet (100 mg total) by mouth daily. (Patient taking differently: Take 50 mg by mouth daily.) 90 tablet 3 Past Week   nicotine (NICODERM CQ) 7 mg/24hr patch Place 1 patch (7 mg total) onto the skin daily. 28 patch 3 Past Week   nortriptyline (PAMELOR) 10 MG capsule Take 1 capsule (10 mg total) by mouth at bedtime. 90 capsule 1 Past Week   Omega-3 Fatty Acids (FISH OIL) 1000 MG CAPS Take 5,000 mg by mouth daily.   Past Week   potassium chloride SA (KLOR-CON M) 20 MEQ tablet Take 1 tablet (20 mEq total) by mouth daily. 90 tablet 3 Past Week   rosuvastatin (CRESTOR) 40 MG tablet Take 1 tablet (40 mg total) by mouth daily. 90 tablet 3 Past Week   Semaglutide, 1 MG/DOSE, 4 MG/3ML SOPN Inject 1 mg as directed once a week. (Patient taking differently: Inject 1 mg as directed once a week. On Mondays) 3 mL 6 06/11/2023   spironolactone (ALDACTONE) 25 MG tablet Take 1 tablet (25 mg total) by mouth daily.   Past Week   TRELEGY ELLIPTA 100-62.5-25 MCG/ACT AEPB Inhale 1 puff by mouth once daily 180 each 1 Past Week   VENTOLIN HFA 108 (90 Base) MCG/ACT inhaler INHALE 2 PUFFS BY MOUTH EVERY 6 HOURS AS NEEDED FOR WHEEZING OR SHORTNESS OF BREATH 54 g 6 06/21/2023   warfarin (COUMADIN) 4 MG tablet TAKE 1 TABLET BY MOUTH ONCE DAILY AT  6PM  AS  DIRECTED (Patient taking differently: TAKE 4MG  BY MOUTH FOR 2 DAYS, THEN TAKE 7.5MG  ON THE THIRD DAY, THEN REPEAT) 180 tablet 3 Past  Week   warfarin (COUMADIN) 7.5 MG tablet TAKE 4MG  BY MOUTH FOR 2 DAYS, THEN TAKE 7.5MG  ON THE THIRD DAY, THEN REPEAT 90 tablet 1 Past Week   albuterol (PROVENTIL) (2.5 MG/3ML) 0.083% nebulizer solution USE 1 VIAL IN NEBULIZER EVERY 6 HOURS AS NEEDED FOR WHEEZING FOR SHORTNESS OF BREATH (Patient not taking: Reported on 06/22/2023) 180 mL 1 Not Taking   amiodarone (NEXTERONE PREMIX) 360-4.14 MG/200ML-% SOLN Inject 30 mg/hr into the vein continuous. (Patient not taking: Reported on 06/22/2023)   Not Taking   furosemide (LASIX) 10 MG/ML injection Inject 8 mLs (80 mg total)  into the vein 2 (two) times daily. (Patient not taking: Reported on 06/22/2023)   Not Taking   heparin 16109 UT/250ML infusion Inject 2,050 Units/hr into the vein continuous. (Patient not taking: Reported on 06/22/2023)   Not Taking    Inpatient Medications:   allopurinol  300 mg Oral Daily   amiodarone  400 mg Oral BID   Followed by   Melene Muller ON 06/28/2023] amiodarone  400 mg Oral Daily   aspirin  81 mg Oral Daily   buPROPion  150 mg Oral BID   nortriptyline  10 mg Oral QHS   rosuvastatin  40 mg Oral Daily   sodium chloride flush  3 mL Intravenous Q12H   spironolactone  25 mg Oral Daily   Warfarin - Pharmacist Dosing Inpatient   Does not apply q1600    Allergies: No Known Allergies  Family History  Problem Relation Age of Onset   Arthritis Mother    Dementia Mother    Colon cancer Mother    Arthritis Father    Diabetes Father    Stroke Father    Colon cancer Father    Heart attack Brother    Breast cancer Sister    Seizures Sister    Cancer Brother        brain   Heart disease Brother    Heart attack Brother      Physical Exam: Vitals:   06/24/23 1639 06/24/23 1958 06/24/23 2311 06/25/23 0317  BP: 113/81 103/71 108/79 115/88  Pulse: (!) 103 67 82 80  Resp: 20 19 16 19   Temp: 98 F (36.7 C) 98.5 F (36.9 C) 98.4 F (36.9 C) 98.1 F (36.7 C)  TempSrc: Oral Oral Oral Oral  SpO2: 95% 94% 95% 96%   Weight:    117.9 kg  Height:        GEN- NAD, A&O x 3, normal affect HEENT: Normocephalic, atraumatic Lungs- CTAB, Normal effort.  Heart- Irregular rate and rhythm, No M/G/R.  GI- Soft, obese but easily compressible, NT Extremities- No clubbing, cyanosis, or edema   Radiology/Studies: CARDIAC CATHETERIZATION Result Date: 06/20/2023 Conclusions: Mild to moderate, non-obstructive coronary artery disease with 60% D1 stenosis and 20% mid LAD and proximal RCA lesions.  No significant change noted from prior angiogram in 2020. Moderately elevated left heart filling pressure (PCWP 30 mmHg with prominent v-waves). Severely elevated right heart filling pressure (RA 25, RVEDP 18 mmHg). Moderate pulmonary hypertension (PA 50/28, mean 35 mmHg). Moderately reduced Fick cardiac output/index (CO 4.4 L/min, CI 1.9 L/min/m^2). Recommendations: Resume diuresis.  If renal function worsens and/or adequate diuresis cannot be achieved, inotropic therapy may need to be considered. Reduce metoprolol succinate in the setting of low-output heart failure. Consult advanced heart failure and electrophysiology teams to assist with management of acute on chronic HFrEF and sustained ventricular tachycardia. Resume heparin infusion 2 hours after TR band has been removed; transition back to warfarin once it is clear that no further invasive procedures will be needed this admission. Yvonne Kendall, MD [facility]  ECHOCARDIOGRAM COMPLETE Result Date: 06/20/2023    ECHOCARDIOGRAM REPORT   Patient Name:   SHAWNTA DAHER Date of Exam: 06/20/2023 Medical Rec #:  [id number]       Height:       66.0 in Accession #:    [id number]      Weight:       284.8 lb Date of Birth:  [birth year]       BSA:  2.324 m Patient Age:    57 years        BP:           118/78 mmHg Patient Gender: M               HR:           80 bpm. Exam Location:  [facility] Procedure: 2D Echo, Cardiac Doppler, Color Doppler and Intracardiac            Opacification  Agent Indications:     Atrial Fibrillation  History:         Patient has prior history of Echocardiogram examinations, most                  recent 04/03/2022. CHF and Cardiomyopathy, CAD, COPD and TIA,                  Aortic Valve Disease, Arrythmias:Atrial Fibrillation and                  Tachycardia; Risk Factors:Hypertension, Diabetes, Dyslipidemia                  and Former Smoker. CKD, There is a 27mm Carbometrics mechanical                  valve in the Aortic position.  Sonographer:     Mikki Harbor Referring Phys:  [id number] CADENCE H FURTH Diagnosing Phys: Julien Nordmann MD  Sonographer Comments: Technically difficult study due to poor echo windows and patient is obese. IMPRESSIONS  1. Left ventricular ejection fraction, by estimation, is 20 to 25%. The left ventricle has severely decreased function. The left ventricle demonstrates global hypokinesis. The left ventricular internal cavity size was mildly dilated. There is mild left ventricular hypertrophy. Left ventricular diastolic parameters are indeterminate.  2. Right ventricular systolic function is moderately reduced. The right ventricular size is normal. There is mildly elevated pulmonary artery systolic pressure. The estimated right ventricular systolic pressure is 39.6 mmHg.  3. Left atrial size was moderately dilated.  4. The mitral valve is normal in structure. Mild mitral valve regurgitation. No evidence of mitral stenosis.  5. Tricuspid valve regurgitation is moderate to severe.  6. The aortic valve has been repaired/replaced. There is a 27 mm Carbometrics mechanical valve present. Aortic valve regurgitation is not visualized. No aortic stenosis is present. Aortic valve mean gradient measures 8.0 mmHg.  7. There is mild dilatation of the aortic arch, measuring 43 mm.  8. The inferior vena cava is dilated in size with <50% respiratory variability, suggesting right atrial pressure of 15 mmHg. FINDINGS  Left Ventricle: Left ventricular  ejection fraction, by estimation, is 20 to 25%. The left ventricle has severely decreased function. The left ventricle demonstrates global hypokinesis. Definity contrast agent was given IV to delineate the left ventricular endocardial borders. The left ventricular internal cavity size was mildly dilated. There is mild left ventricular hypertrophy. Left ventricular diastolic parameters are indeterminate. Right Ventricle: The right ventricular size is normal. No increase in right ventricular wall thickness. Right ventricular systolic function is moderately reduced. There is mildly elevated pulmonary artery systolic pressure. The tricuspid regurgitant velocity is 2.48 m/s, and with an assumed right atrial pressure of 15 mmHg, the estimated right ventricular systolic pressure is 39.6 mmHg. Left Atrium: Left atrial size was moderately dilated. Right Atrium: Right atrial size was normal in size. Pericardium: There is no evidence of pericardial effusion. Mitral Valve: The mitral valve is normal in  structure. Mild mitral valve regurgitation. No evidence of mitral valve stenosis. MV peak gradient, 8.9 mmHg. The mean mitral valve gradient is 4.0 mmHg. Tricuspid Valve: The tricuspid valve is normal in structure. Tricuspid valve regurgitation is moderate to severe. No evidence of tricuspid stenosis. Aortic Valve: The aortic valve has been repaired/replaced. Aortic valve regurgitation is not visualized. No aortic stenosis is present. Aortic valve mean gradient measures 8.0 mmHg. Aortic valve peak gradient measures 13.7 mmHg. Aortic valve area, by VTI  measures 1.12 cm. There is a mechanical valve present in the aortic position. Pulmonic Valve: The pulmonic valve was normal in structure. Pulmonic valve regurgitation is not visualized. No evidence of pulmonic stenosis. Aorta: The aortic root is normal in size and structure. There is mild dilatation of the aortic arch, measuring 43 mm. Venous: The inferior vena cava is dilated in  size with less than 50% respiratory variability, suggesting right atrial pressure of 15 mmHg. IAS/Shunts: No atrial level shunt detected by color flow Doppler.  LEFT VENTRICLE PLAX 2D LVIDd:         5.30 cm LVIDs:         5.00 cm LV PW:         1.40 cm LV IVS:        1.30 cm LVOT diam:     2.00 cm LV SV:         40 LV SV Index:   17 LVOT Area:     3.14 cm  RIGHT VENTRICLE RV Basal diam:  4.90 cm RV Mid diam:    3.50 cm RV S prime:     7.87 cm/s LEFT ATRIUM             Index        RIGHT ATRIUM           Index LA diam:        6.00 cm 2.58 cm/m   RA Area:     41.90 cm LA Vol (A2C):   99.2 ml 42.68 ml/m  RA Volume:   174.00 ml 74.87 ml/m LA Vol (A4C):   69.0 ml 29.69 ml/m LA Biplane Vol: 82.7 ml 35.58 ml/m  AORTIC VALVE                     PULMONIC VALVE AV Area (Vmax):    1.13 cm      PV Vmax:       1.19 m/s AV Area (Vmean):   1.00 cm      PV Peak grad:  5.7 mmHg AV Area (VTI):     1.12 cm AV Vmax:           185.33 cm/s AV Vmean:          133.667 cm/s AV VTI:            0.360 m AV Peak Grad:      13.7 mmHg AV Mean Grad:      8.0 mmHg LVOT Vmax:         66.73 cm/s LVOT Vmean:        42.433 cm/s LVOT VTI:          0.128 m LVOT/AV VTI ratio: 0.36  AORTA Ao Root diam: 3.40 cm MITRAL VALVE                TRICUSPID VALVE MV Area (PHT): 3.81 cm     TR Peak grad:   24.6 mmHg MV Area VTI:   1.60 cm  TR Vmax:        248.00 cm/s MV Peak grad:  8.9 mmHg MV Mean grad:  4.0 mmHg     SHUNTS MV Vmax:       1.49 m/s     Systemic VTI:  0.13 m MV Vmean:      91.7 cm/s    Systemic Diam: 2.00 cm MV Decel Time: 199 msec MV E velocity: 140.00 cm/s Julien Nordmann MD Electronically signed by Julien Nordmann MD Signature Date/Time: 06/20/2023/2:26:04 PM    Final    DG Chest Portable 1 View Result Date: 06/17/2023 CLINICAL DATA:  Shortness of breath EXAM: PORTABLE CHEST 1 VIEW COMPARISON:  Chest x-ray 11/04/2018 FINDINGS: The heart is enlarged. The lungs are clear. There is no pleural effusion or pneumothorax. No acute  fractures are seen. Sternotomy wires are present. IMPRESSION: Cardiomegaly. No acute cardiopulmonary process. Electronically Signed   By: Darliss Cheney M.D.   On: 06/17/2023 21:56    EKG: (personally reviewed) 06/18/2023 - afib with LBBB/IVCD QRS 140, rate 65 12/15 at 2115 - regular, monomorphic at 191bmp, LBBB >> highly suggestive of VT   TELEMETRY: AFib 60s-70s (personally reviewed)   Assessment/Plan: #) Monomorphic VT #) NICM #) HFrEF - LVEF 20-25% new #) s/p mechanical AVR #) Thoracic aortic aneurysm #) atrial fibrillation  VT quiescent on amiodarone Continue PO amiodarone load  Has been adequately diuresed per primary team Patient meets criteria for ICD INR this morning 2.2 Stop hep gtt in anticipation of ICD this afternoon NPO for procedure Discussed risks/benefits of procedure including post-procedure shoulder restrictions Patient verbalized understanding and wished to proceed Keep K > 4, Mag > 2   For questions or updates, please contact [facility] Please consult [email or web address] for contact info under Cardiology/STEMI.  Signed, Sherie Don, NP  06/25/2023 9:07 AM  EP Attending  Patient seen and examined. Agree with the findings as noted above. The patient presented with hemodynamically unstable VT in the setting of prior AVR and worsening LV dysfunction EF 25%. He has been treated with IV diuresis and started on amiodarone. Afib persists. The finding of underlying atrial fib after return from wide QRS tachy essentially confirms the diagnosis of VT. I have discussed the indications/risks/benefits/goals/expectations of ICD insertion and he wishes to proceed.  [name] Maneh Sieben,MD

## 2023-06-26 ENCOUNTER — Telehealth: Payer: Self-pay

## 2023-06-26 ENCOUNTER — Encounter (HOSPITAL_COMMUNITY): Payer: Self-pay | Admitting: Internal Medicine

## 2023-06-26 ENCOUNTER — Other Ambulatory Visit (HOSPITAL_COMMUNITY): Payer: Self-pay

## 2023-06-26 ENCOUNTER — Inpatient Hospital Stay (HOSPITAL_COMMUNITY): Payer: 59

## 2023-06-26 DIAGNOSIS — I5023 Acute on chronic systolic (congestive) heart failure: Secondary | ICD-10-CM | POA: Diagnosis not present

## 2023-06-26 DIAGNOSIS — I472 Ventricular tachycardia, unspecified: Secondary | ICD-10-CM | POA: Diagnosis not present

## 2023-06-26 DIAGNOSIS — I429 Cardiomyopathy, unspecified: Secondary | ICD-10-CM | POA: Diagnosis not present

## 2023-06-26 LAB — PROTIME-INR
INR: 2.1 — ABNORMAL HIGH (ref 0.8–1.2)
Prothrombin Time: 24.1 s — ABNORMAL HIGH (ref 11.4–15.2)

## 2023-06-26 LAB — CBC
HCT: 42.6 % (ref 39.0–52.0)
Hemoglobin: 13.7 g/dL (ref 13.0–17.0)
MCH: 27.7 pg (ref 26.0–34.0)
MCHC: 32.2 g/dL (ref 30.0–36.0)
MCV: 86.1 fL (ref 80.0–100.0)
Platelets: 254 10*3/uL (ref 150–400)
RBC: 4.95 MIL/uL (ref 4.22–5.81)
RDW: 15.8 % — ABNORMAL HIGH (ref 11.5–15.5)
WBC: 8.9 10*3/uL (ref 4.0–10.5)
nRBC: 0 % (ref 0.0–0.2)

## 2023-06-26 LAB — BASIC METABOLIC PANEL
Anion gap: 12 (ref 5–15)
BUN: 30 mg/dL — ABNORMAL HIGH (ref 8–23)
CO2: 22 mmol/L (ref 22–32)
Calcium: 9.8 mg/dL (ref 8.9–10.3)
Chloride: 101 mmol/L (ref 98–111)
Creatinine, Ser: 1.55 mg/dL — ABNORMAL HIGH (ref 0.61–1.24)
GFR, Estimated: 48 mL/min — ABNORMAL LOW (ref >60)
Glucose, Bld: 97 mg/dL (ref 70–99)
Potassium: 3.7 mmol/L (ref 3.5–5.1)
Sodium: 135 mmol/L (ref 135–145)

## 2023-06-26 LAB — MAGNESIUM: Magnesium: 2.2 mg/dL (ref 1.7–2.4)

## 2023-06-26 LAB — HEPARIN LEVEL (UNFRACTIONATED): Heparin Unfractionated: <0.1 [IU]/mL — ABNORMAL LOW (ref 0.30–0.70)

## 2023-06-26 MED ORDER — TORSEMIDE 20 MG PO TABS
40.0000 mg | ORAL_TABLET | Freq: Every day | ORAL | Status: DC
  Administered 2023-06-26: 40 mg via ORAL
  Filled 2023-06-26: qty 2

## 2023-06-26 MED ORDER — WARFARIN SODIUM 7.5 MG PO TABS
7.5000 mg | ORAL_TABLET | Freq: Once | ORAL | Status: AC
  Administered 2023-06-26: 7.5 mg via ORAL
  Filled 2023-06-26: qty 1

## 2023-06-26 MED ORDER — AMIODARONE HCL 200 MG PO TABS
ORAL_TABLET | ORAL | 0 refills | Status: DC
  Filled 2023-06-26: qty 68, 32d supply, fill #0

## 2023-06-26 MED ORDER — TORSEMIDE 20 MG PO TABS
40.0000 mg | ORAL_TABLET | Freq: Every day | ORAL | 6 refills | Status: DC
  Filled 2023-06-26: qty 60, 30d supply, fill #0

## 2023-06-26 MED ORDER — POTASSIUM CHLORIDE CRYS ER 20 MEQ PO TBCR
40.0000 meq | EXTENDED_RELEASE_TABLET | Freq: Once | ORAL | Status: AC
  Administered 2023-06-26: 40 meq via ORAL
  Filled 2023-06-26: qty 2

## 2023-06-26 MED ORDER — WARFARIN SODIUM 7.5 MG PO TABS: ORAL_TABLET | ORAL | Status: DC

## 2023-06-26 MED ORDER — WARFARIN SODIUM 4 MG PO TABS: ORAL_TABLET | ORAL | Status: DC

## 2023-06-26 MED FILL — Midazolam HCl Inj 5 MG/5ML (Base Equivalent): INTRAMUSCULAR | Qty: 5 | Status: AC

## 2023-06-26 MED FILL — Gentamicin Sulfate Inj 40 MG/ML: INTRAMUSCULAR | Qty: 80 | Status: AC

## 2023-06-26 NOTE — Telephone Encounter (Signed)
Follow-up after same day discharge: Implant date: 06/25/2023 MD: Lewayne Bunting, MD Device: icd sjm Location: l chest    Wound check visit: yes 90 day MD follow-up: yes  Remote Transmission received:not yet pt is still in hospital  Dressing/sling removed: n/a  Confirm OAC restart on: n/a  Please continue to monitor your cardiac device site for redness, swelling, and drainage. Call the device clinic at [phone] if you experience these symptoms, fever/chills, or have questions about your device.   Remote monitoring is used to monitor your cardiac device from home. This monitoring is scheduled every 91 days by our office. It allows [place] to keep an eye on the functioning of your device to ensure it is working properly.  Spoke with friend Selena Batten and let her know we will reach out after pt has been discharged

## 2023-06-26 NOTE — Progress Notes (Signed)
PHARMACY - ANTICOAGULATION CONSULT NOTE  Pharmacy Consult for heparin + warfarin Indication: atrial fibrillation + mechanical valve AVR  Patient Measurements: Height: 5\' 6"  (167.6 cm) Weight: 118.3 kg (260 lb 12.9 oz) IBW/kg (Calculated) : 63.8 Heparin Dosing Weight: 91.2 kg  Labs: Recent Labs    06/24/23 0207 06/24/23 1914 06/25/23 0154 06/26/23 0209  HGB 14.6  --  14.2 13.7  HCT 45.2  --  43.7 42.6  PLT 259  --  255 254  LABPROT 23.6*  --  24.2* 24.1*  INR 2.1*  --  2.2* 2.1*  HEPARINUNFRC 0.60 0.56 0.59 <0.10*  CREATININE 1.88*  --  1.63* 1.55*   Estimated Creatinine Clearance: 55.2 mL/min (A) (by C-G formula based on SCr of 1.55 mg/dL (H)).  Medical History: Past Medical History:  Diagnosis Date   Bicuspid aortic valve    a. s/p #27 Carbomedics mechanical valve on 03/25/2010; b. on Coumadin; c. TTE 12/17: EF 40-45%, moderately dilated LV with moderate LVH, AVR well-seated with 14 mmHg gradient, peak AV velocity 2.5 m/s, mild mitral valve thickening with mild MR, mildly dilated RV with mildly reduced contraction   Cellulitis    Chronic kidney disease    Chronic systolic CHF (congestive heart failure) (HCC)    a. R/LHC 03/2010 showed no significant CAD, LVEDP 31 mmHg, mean AoV gradient 34 mmHg at rest and 47 mmHg with dobutamine 20 mcg/kg/min, AVA 1.0 cm^2, RA 31, RV 68/25, PA 68/47, PCWP 38. PA sat 65%. CO 6.2 L/min (Fick) and 5.3 L/min (thermodilution)   Clotting disorder (HCC)    H/O mechanical aortic valve replacement 03/25/2010   a. #27 Carbomedics mechanical valve   Hypercholesterolemia    Renal infarct [facility]) 2017   Multiple right renal infarcts, likely embolic.   Stroke [facility])    TIA (transient ischemic attack) 05/2014   Assessment: 66 y/o male presenting with palpitations. PMH significant for atrial fibrillation and mechanical aortic valve replacement in 03/2010 (on warfarin PTA). Pharmacy has been consulted to initiate heparin infusion given that patient may  need heart catheterization. CHADS2VASc 5 (HTN, DM, CAD, Age, HTN).   Per anticoagulation clinic notes:  Home warfarin regimen is 7.5 mg on Tuesdays and Fridays, 4 mg all other days PTA INR goal is 2.5-3.5  INR up to 2.1 today.  No overt bleeding or complications noted.   Goal of Therapy:  Heparin level 0.3-0.7 units/ml INR goal 1.8-2.5 (pre-procedure for ICD) Monitor platelets by anticoagulation protocol: Yes  Plan:  Warfarin 7.5 mg po x 1 tonight (c/w home dose) Daily INR.  Reece Leader, Colon Flattery, BCCP Clinical Pharmacist  06/26/2023 2:44 PM   [facility] pharmacy phone numbers are listed on [email or web address]

## 2023-06-26 NOTE — Evaluation (Signed)
Physical Therapy Evaluation and DISCHARGE Patient Details Name: Roberto [name] MRN: [id number] DOB: 08-12-1955 Today's Date: 06/26/2023  History of Present Illness  Pt is a 67 y.o. male s/p ICD placement on 12/23. He presented 12/15 with heart palpitations, diaphoresis and SHOB. EKG concerning for ventricular tachycardia with plan for cardioversion, but pt went into afib. Now s/p R/L heart cath; found to have severely reduced cardiac index with elevated filling pressures. Transferred to [facility] 12/20 for potential ICD placement. PMH significant for chronic systolic CHF, nonischemic cardiomyopathy, CKD, DMII, mechanical bicuspid valve replacement on chronic anticoagulation, dyslipidemia, CVA, thoracic aortic aneurysm, TIA.   Clinical Impression  Pt had ICD placed on 12/23. Pt educated on use of sling on L UE to remind pt to minimize use of L UE to adhere to ICD precautions. Pt requiring maxA directional cues to adhere to them. Pt functioning at indep level with no need for further acute PT or follow up PT upon d/c.   PT SIGNING OFF.      If plan is discharge home, recommend the following: Assistance with cooking/housework   Can travel by private vehicle        Equipment Recommendations None recommended by PT  Recommendations for Other Services       Functional Status Assessment Patient has not had a recent decline in their functional status     Precautions / Restrictions Precautions Precautions: Other (comment) Precaution Comments: watch HR Restrictions Weight Bearing Restrictions Per Provider Order: Yes LUE Weight Bearing Per Provider Order: Non weight bearing Other Position/Activity Restrictions: L UE in sling      Mobility  Bed Mobility               General bed mobility comments: Pt sitting EOB at start of session and sitting in chair end of session    Transfers Overall transfer level: Independent Equipment used: None               General transfer comment:  No assistance needed, no LOB, increased time due to report of chronic back pain    Ambulation/Gait Ambulation/Gait assistance: Independent Gait Distance (Feet): 400 Feet Assistive device: None Gait Pattern/deviations: Step-through pattern Gait velocity: WFL Gait velocity interpretation: >4.37 ft/sec, indicative of normal walking speed   General Gait Details: Pt ambulates with a step-through gait pattern. Mild sway noted, but no overt LOB even with dynamic challenges  Stairs            Wheelchair Mobility     Tilt Bed    Modified Rankin (Stroke Patients Only)       Balance Overall balance assessment: Independent                                           Pertinent Vitals/Pain Pain Assessment Pain Assessment: 0-10 Pain Score: 3  Pain Descriptors / Indicators: Sore Pain Intervention(s): Monitored during session    Home Living Family/patient expects to be discharged to:: Private residence Living Arrangements: Non-relatives/Friends Available Help at Discharge: Family;Available PRN/intermittently Type of Home: House Home Access: Stairs to enter Entrance Stairs-Rails: None Entrance Stairs-Number of Steps: 1   Home Layout: Two level;Able to live on main level with bedroom/bathroom Home Equipment: Rolling Walker (2 wheels);Hospital bed      Prior Function Prior Level of Function : Independent/Modified Independent;Driving             Mobility Comments: No  AD, no recent falls ADLs Comments: Friend does the cooking and cleaning, yard work, grocery shopping, seemingly more to be helpful than necessary     Extremity/Trunk Assessment   Upper Extremity Assessment Upper Extremity Assessment:  (L UE in sling due to ICD precautions)         Cervical / Trunk Assessment Cervical / Trunk Assessment: Normal  Communication   Communication Communication: No apparent difficulties  Cognition Arousal: Alert Behavior During Therapy: WFL for tasks  assessed/performed Overall Cognitive Status: Within Functional Limits for tasks assessed                                          General Comments      Exercises     Assessment/Plan    PT Assessment Patient does not need any further PT services  PT Problem List         PT Treatment Interventions      PT Goals (Current goals can be found in the Care Plan section)  Acute Rehab PT Goals Patient Stated Goal: to go home PT Goal Formulation: All assessment and education complete, DC therapy    Frequency       Co-evaluation               AM-PAC PT "6 Clicks" Mobility  Outcome Measure Help needed turning from your back to your side while in a flat bed without using bedrails?: None Help needed moving from lying on your back to sitting on the side of a flat bed without using bedrails?: None Help needed moving to and from a bed to a chair (including a wheelchair)?: None Help needed standing up from a chair using your arms (e.g., wheelchair or bedside chair)?: None Help needed to walk in hospital room?: None Help needed climbing 3-5 steps with a railing? : None 6 Click Score: 24    End of Session Equipment Utilized During Treatment:  (L sling) Activity Tolerance: Patient tolerated treatment well Patient left: in chair;with call bell/phone within reach Nurse Communication: Mobility status PT Visit Diagnosis: Other (comment);Dizziness and giddiness (R42) (cardio deficits)    Time: 1324-4010 PT Time Calculation (min) (ACUTE ONLY): 17 min   Charges:   PT Evaluation $PT Eval Low Complexity: 1 Low   PT General Charges $$ ACUTE PT VISIT: 1 Visit         Lewis Shock, PT, DPT Acute Rehabilitation Services Secure chat preferred Office #: [phone]   Iona Hansen 06/26/2023, 1:51 PM

## 2023-06-26 NOTE — Progress Notes (Addendum)
Advanced Heart Failure Rounding Note  PCP-Cardiologist: Yvonne Kendall, MD   Chief complaint: Acute on chronic systolic CHF  Subjective:    S/p Abbott single lead ICD and successful ATP w termination of VT 06/25/23. In Afib 80s on tele. No recurrent VT.  cMRI complete.  Feeling well. No dyspnea, SOB, or CP.   Objective:    Weight Range: 118.3 kg Body mass index is 42.09 kg/m.   Vital Signs:   Temp:  [97.6 F (36.4 C)-98 F (36.7 C)] 98 F (36.7 C) (12/24 0350) Pulse Rate:  [68-165] 68 (12/24 0350) Resp:  [13-30] 16 (12/24 0350) BP: (92-125)/(74-109) 112/85 (12/24 0350) SpO2:  [91 %-100 %] 92 % (12/24 0350) Weight:  [118.3 kg] 118.3 kg (12/24 0613) Last BM Date : 06/22/23  Weight change: Filed Weights   06/24/23 0328 06/25/23 0317 06/26/23 1610  Weight: 118.3 kg 117.9 kg 118.3 kg    Intake/Output:   Intake/Output Summary (Last 24 hours) at 06/26/2023 0915 Last data filed at 06/26/2023 0038 Gross per 24 hour  Intake 630 ml  Output --  Net 630 ml    Physical Exam   General: Well appearing. No distress on RA HEENT: neck supple.   Cardiac: JVP ~8cm. S1 and S2 present. No murmurs or rub. Resp: Lung sounds clear and equal B/L Abdomen: Soft, non-tender, non-distended. + BS. Extremities: Warm and dry. No rash, cyanosis.  Trace edema. R shoulder pressure dressing in place Neuro: Alert and oriented x3. Affect pleasant. Moves all extremities without difficulty.  Telemetry   Afib 70-80s (personally reviewed)  Patient Profile   Roberto [name] is a 67 y.o. male with PMH of AS s/p 27mm carbomerics mechanical AVR in 2016, non-obstrurctive CAD, TIA, acute on chronic systolic heart failure ascending aortic aneurysm. Presented with sustained V and acute on chronic systolic CHF, transferred for ICD placement.   Assessment/Plan   Acute on chronic systolic heart failure  - NICM - Echocardiogram with LVEF 25% and preserved RV function in the setting of volume  overload. Severe TR.  - R/LHC with nonobstructive CAD, severely elevated filling pressures, Fick CI 1.9. - Cardiac MRI  this admit to evaluate for scar burden / LGE; apex appears to be thin and akinetic on contrasted echo imaging.   - cMRI with EF 20% and mod LAE and severe RAE. Mild MR. Mod TR. Severe LVE and mild RVE. - Restart Torsemide 40 mg daily - Plan to [place] in follow up post ICD implant - Continue spiro 25 mg daily - No beta blocker with low-output - Based on recent VT and worsening of LVEF, suspect Roberto [name] may require advanced therapies in the future; high risk due to aortic aneurysm. His only support at this time is his girlfriend.    2. Ventricular tachycardia - Likely secondary to decompensated heart failure with severely elevated filling pressures and reduced cardiac index.  - No recurrence on telemetry overnight. - EP following. S/p single lead ICD placement and successful ATP w termination of VT 06/25/23. - On amio 400 BID.  3. Mechanical AVR - Car[employer] 27mm valve placed in 2011 for bicuspid valve disease - His INR goal appears to be 2.5-3.5, unclear why the increase from typical goal of 2-3.  INR 2.1 today. - Discussed warfarin dosing with pharmD.    4. Atrial fibrillation - In rate controlled afib on tele.  - On amio 400 bid. D/w EP about persistent afib, recommend continuing amio and follow up with EP as outpatient - TSH  1.8; within normal limits.  - On warfarin   5. Tobacco use - Reports that he has started smoking again.  - Recommend cessation   6. Aortic aneurysm - Followed by Dr. Laneta Simmers now - 5.7 cm fusiform ascending aortic aneurysm; high risk for re-do surgery. High risk for advanced therapies.   Anticipated discharge in within next 24-48hr.  Length of Stay: 4  [place] Lee, NP  06/26/2023, 9:15 AM  Advanced Heart Failure Team Pager [phone] (M-F; 7a - 5p)  Please contact [facility] for night-coverage after hours (5p -7a ) and weekends  on [email or web address]   Patient seen and examined with the above-signed Advanced Practice Provider and/or Housestaff. I personally reviewed laboratory data, imaging studies and relevant notes. I independently examined the patient and formulated the important aspects of the plan. I have edited the note to reflect any of my changes or salient points. I have personally discussed the plan with the patient and/or family.  Underwent ICD implant yesterday. Feels ok. Denies CP or SOB.   cMRI EF 20% no LGE.   Remains in AF with controlled rate. INR 2.1   General:  Sitting up on side of bed. No resp difficulty HEENT: normal Neck: supple. no JVD. Carotids 2+ bilat; no bruits. No lymphadenopathy or thryomegaly appreciated. Cor: ICD dressing in place Irregular rate & rhythm. 2/6 AS Lungs: clear Abdomen: soft, nontender, nondistended. No hepatosplenomegaly. No bruits or masses. Good bowel sounds. Extremities: no cyanosis, clubbing, rash, edema Neuro: alert & orientedx3, cranial nerves grossly intact. moves all 4 extremities w/o difficulty. Affect pleasant  He is doing well post ICD implant. We discussed staying in hospital for DC-CV on Thursday versus outpatient DC-CV. He would like to go home today (tomorrow is Christmas).   Will arrange for outpatient DC-CV with Dr. Okey Dupre. Continue amio.   cMRI without scar.   Unfortunately unlikely to be candidate for advanced HF therapies with 5.7 Asc Ao aneurysm.   Ok for d/c today. F/u with Dr. Okey Dupre arranged for 1/9. He can also follow in [facility] with [place].   Arvilla Meres, MD  3:28 PM

## 2023-06-26 NOTE — Evaluation (Signed)
Occupational Therapy Evaluation Patient Details Name: Roberto [name] MRN: [id number] DOB: October 31, 1955 Today's Date: 06/26/2023   History of Present Illness Pt is a 66 y.o. male s/p ICD placement on 12/23. He presented 12/15 with heart palpitations, diaphoresis and SHOB. EKG concerning for ventricular tachycardia with plan for cardioversion, but pt went into afib. Now s/p R/L heart cath; found to have severely reduced cardiac index with elevated filling pressures. Transferred to [facility] 12/20 for potential ICD placement. PMH significant for chronic systolic CHF, nonischemic cardiomyopathy, CKD, DMII, mechanical bicuspid valve replacement on chronic anticoagulation, dyslipidemia, CVA, thoracic aortic aneurysm, TIA.   Clinical Impression   PTA, pt lived with roommate who assisted with ADL and occasionally with donning socks. Upon eval, pt with decreased knowledge of pacemaker precautions. Educated regarding precautions and UB ADL within precautions as well as sling wear schedule, sleep/rest positioning. Pt and friend verbalized/demo understanding. No further acute needs identified. OT to sign off. Please re-consult if future needs arise.       If plan is discharge home, recommend the following: A lot of help with bathing/dressing/bathroom;Assistance with cooking/housework;Assist for transportation;Help with stairs or ramp for entrance    Functional Status Assessment  Patient has had a recent decline in their functional status and demonstrates the ability to make significant improvements in function in a reasonable and predictable amount of time.  Equipment Recommendations  Tub/shower bench (if unable to get, shower seat)    Recommendations for Other Services       Precautions / Restrictions Precautions Precautions: Other (comment) Precaution Comments: watch HR Restrictions Weight Bearing Restrictions Per Provider Order: Yes LUE Weight Bearing Per Provider Order: Non weight bearing Other  Position/Activity Restrictions: L UE in sling      Mobility Bed Mobility               General bed mobility comments: in hall on arrival    Transfers Overall transfer level: Independent Equipment used: None               General transfer comment: No assistance needed, no LOB, increased time due to report of chronic back pain      Balance Overall balance assessment: Independent                                         ADL either performed or assessed with clinical judgement   ADL Overall ADL's : Needs assistance/impaired Eating/Feeding: Modified independent   Grooming: Modified independent;Standing   Upper Body Bathing: Modified independent   Lower Body Bathing: Minimal assistance;Sitting/lateral leans;Sit to/from stand   Upper Body Dressing : Minimal assistance Upper Body Dressing Details (indicate cue type and reason): provided by friend present in session also friend is roommate Lower Body Dressing: Minimal assistance   Toilet Transfer: Modified Independent           Functional mobility during ADLs: Modified independent       Vision         Perception         Praxis         Pertinent Vitals/Pain Pain Assessment Pain Assessment: Faces Faces Pain Scale: Hurts a little bit Pain Descriptors / Indicators: Sore Pain Intervention(s): Limited activity within patient's tolerance, Monitored during session     Extremity/Trunk Assessment Upper Extremity Assessment Upper Extremity Assessment: Left hand dominant (LUE not formally assessed as pt with pacemaker precautions)   Lower Extremity  Assessment Lower Extremity Assessment: Defer to PT evaluation   Cervical / Trunk Assessment Cervical / Trunk Assessment: Normal   Communication Communication Communication: No apparent difficulties   Cognition Arousal: Alert Behavior During Therapy: WFL for tasks assessed/performed Overall Cognitive Status: Within Functional Limits for  tasks assessed                                       General Comments  Provided sternal precautions handout    Exercises     Shoulder Instructions      Home Living Family/patient expects to be discharged to:: Private residence Living Arrangements: Non-relatives/Friends Available Help at Discharge: Family;Available PRN/intermittently Type of Home: House Home Access: Stairs to enter [employer] of Steps: 1 Entrance Stairs-Rails: None Home Layout: Two level;Able to live on main level with bedroom/bathroom     Bathroom Shower/Tub: Chief Strategy Officer: Standard     Home Equipment: Agricultural consultant (2 wheels);Hospital bed          Prior Functioning/Environment Prior Level of Function : Independent/Modified Independent;Driving             Mobility Comments: No AD, no recent falls ADLs Comments: Friend does the cooking and cleaning, yard work, grocery shopping, seemingly more to be helpful than necessary. often dons his socks        OT Problem List: Decreased range of motion;Decreased knowledge of precautions;Cardiopulmonary status limiting activity      OT Treatment/Interventions:      OT Goals(Current goals can be found in the care plan section) Acute Rehab OT Goals Patient Stated Goal: go home OT Goal Formulation: With patient  OT Frequency:      Co-evaluation              AM-PAC OT "6 Clicks" Daily Activity     Outcome Measure Help from another person eating meals?: None Help from another person taking care of personal grooming?: None Help from another person toileting, which includes using toliet, bedpan, or urinal?: None Help from another person bathing (including washing, rinsing, drying)?: A Little Help from another person to put on and taking off regular upper body clothing?: A Little Help from another person to put on and taking off regular lower body clothing?: A Little 6 Click Score: 21   End of  Session Nurse Communication: Mobility status  Activity Tolerance: Patient tolerated treatment well Patient left: in chair;with call bell/phone within reach;with family/visitor present  OT Visit Diagnosis: Other (comment) (decreased activity tolerance)                Time: 1610-9604 OT Time Calculation (min): 29 min Charges:  OT General Charges $OT Visit: 1 Visit OT Evaluation $OT Eval Low Complexity: 1 Low OT Treatments $Self Care/Home Management : 8-22 mins  Tyler Deis, OTR/L [facility] Acute Rehabilitation Office: [phone]   Myrla Halsted 06/26/2023, 3:11 PM

## 2023-06-26 NOTE — Progress Notes (Addendum)
  Patient Name: Roberto [name] Date of Encounter: 06/26/2023  Primary Cardiologist: Yvonne Kendall, MD Electrophysiologist: New  Interval Summary   S/p ICD 12/23  The patient is doing well today.  At this time, the patient denies chest pain, shortness of breath, or any new concerns.  Vital Signs    Vitals:   06/25/23 1930 06/25/23 2254 06/26/23 0350 06/26/23 0613  BP: 101/83 92/75 112/85   Pulse: 90 85 68   Resp: 20 16 16    Temp: 97.6 F (36.4 C) 98 F (36.7 C) 98 F (36.7 C)   TempSrc: Oral Oral Oral   SpO2: 94% 95% 92%   Weight:    118.3 kg  Height:        Intake/Output Summary (Last 24 hours) at 06/26/2023 0730 Last data filed at 06/26/2023 0038 Gross per 24 hour  Intake 630 ml  Output --  Net 630 ml   Filed Weights   06/24/23 0328 06/25/23 0317 06/26/23 0613  Weight: 118.3 kg 117.9 kg 118.3 kg    Physical Exam    GEN- The patient is well appearing, alert and oriented x 3 today.   Lungs- Clear to ausculation bilaterally, normal work of breathing Cardiac- Irregularly irregular rate and rhythm, no murmurs, rubs or gallops GI- soft, NT, ND, + BS Extremities- no clubbing or cyanosis. No edema  Telemetry    NSR 60-80s (personally reviewed)  Hospital Course    Chueyee Koeppel is a 67 y.o. male with a history of aortic stenosis s/p mechanical AVR, thoracic aortic aneurysm, HFrEF, Non-obs CAD who is being seen today for the evaluation of VT at the request of Dr. Elwyn Lade.   cMRI 12/23  Assessment & Plan    Monomorphic VT NICM HFrEF - LVEF 20-25% new Had VT during case which ATP was successful at treating.  Continue amiodarone with taper for now.  GDMT per HF team.  Volume status looks OK today.  Potassium3.7 (12/24 0209) Magnesium  2.2 (12/24 0209) Creatinine, ser  1.55* (12/24 0209) Keep K > 4.0 and Mg > 2.0  Wound care and arm restrictions reviewed and placed in AVS.   s/p Mechanical AVR Atrial fibrillation Continue coumadin. Avoid heparin if  possible.  INR 2.1 Will leave pressure dressing in place. Appointment made for Thursday for removal.   For questions or updates, please contact [facility] Please consult [email or web address] for contact info under Cardiology/STEMI.  Signed, Graciella Freer, [name]  06/26/2023, 7:30 AM   EP Attending  Patient seen and examined. Agree with the findings as noted above. The patient is doing well with no chest pain or sob. His VR is controlled. Interrogation of his single chamber ICD under my direction demonstrates normal ICD function and cxr looks good with no PTX. Stable lead position. He will have the usual followup. DC home on amiodarone. Ok to restart his blood thinner. We will check him in the office in a couple of days.  Loman Chroman Takashi Korol,MD

## 2023-06-27 NOTE — Progress Notes (Unsigned)
  Cardiology Office Note:  .   Date:  06/27/2023  ID:  Roberto [name], DOB [birth year], MRN [id number] PCP: [name], DO  [facility] Providers Cardiologist:  Yvonne Kendall, MD Sleep Medicine:  Armanda Magic, MD {  History of Present Illness: .   Roberto [name] is a 67 y.o. male w/PMHx of VHD (s/p mechanical AVR 2016), no obst CAD, TIA/CVA, renal infarct, ascending Ao aneurysm, chronic CHF (systolic), VT  Admitted 06/17/23 via [facility] found in sustain MMVT > amiodarone > AFib > MCH R/LHC: nonobstructive CAD, severely elevated filling pressures and Fick CI 1.9.  Echo with EF down to 25%, RV okay and severe TR  cMRI was performed which showed EF 20% and mod LAE and severe RAE. Mild MR. Mod TR. Severe LVE and mild RVE Diuresed via HF team ICD insertion 06/25/23  Recurrent VT during implant >> ATP through device, successful in termination the VT  Remained in rate controlled AFIB Planned for amio 400 BID until 12/26 then 400 mg daily  Discharged 06/26/23  VT suspect 2/2 decompensated HF  Today's visit is scheduled to remove pressure dressing/in clinic with Dr. Ladona Ridgel INR 06/26/23 was 2.1  ROS:   He is accompanied by his significant other. Doing well since home No CP, no progression or DOE No rest SOB Pressure dressing held up mostly until today, "slipped some" No pain, no visible bleeding   Device information Abbott single chamber ICD implanted 06/25/23  Arrhythmia/AAD hx VT and Afib Dec 2024 Amiodarone started Dec 2024  Studies Reviewed: Marland Kitchen    EKG not done today  DEVICE interrogation done today and reviewed by myself Battery and lead measurements are good NSVT episodes look like brief RVR  Risk Assessment/Calculations:    Physical Exam:   VS:  There were no vitals taken for this visit.   Wt Readings from Last 3 Encounters:  06/26/23 260 lb 12.9 oz (118.3 kg)  06/22/23 272 lb 14.9 oz (123.8 kg)  05/16/23 264 lb 12.8 oz (120.1 kg)    GEN: Well  nourished, well developed in no acute distress NECK: No JVD; No carotid bruits CARDIAC: irreg-irreg, no murmurs, rubs, gallops RESPIRATORY:  CTA b/l without rales, wheezing or rhonchi  ABDOMEN: Soft, non-tender, non-distended EXTREMITIES: trace-1+ edema; No deformity   ICD site: pressure dressing is removed without difficulty Tegaderm also removed Skin irritation on the border of where tegaderm /dressing was, small blister inferior border, none near the wound.steri strips remain No hematoma, bleeding, drainage   ASSESSMENT AND PLAN: .    ICD Site looks stable Dr. Ladona Ridgel evaluated Recommend gola INR for the next 3 weeks 2-2.5 They do home checks and send to his PMD I have asked they send [place] the INRs for now, she will check tonight and let us know   Advised local care keep clean and dry , use with triple antibiotic for a couple days at the blister, though NOTHING on the site its self' Wound care and LUE restrictions reviewed with them  VT On amiodarone Continue as scheduled   (Likely Persistent) AFib CHA2DS2Vasc is 4, on warfarin Pt reports that DCCV plans were discussed as a future plan Would allow more amiodarone Sees HF team 12/31  VHD w/mechanical AVR NICM Recent hospitalization for acute decompensated HF Has f/u with HF clinic at [facility] on 07/03/23  Secondary hypercoagulable state 2/2 AFib       Dispo: has wound check scheduled, back sooner if needed  Signed, Sheilah Pigeon, [name]

## 2023-06-28 ENCOUNTER — Telehealth: Payer: Self-pay

## 2023-06-28 ENCOUNTER — Telehealth: Payer: Self-pay | Admitting: Internal Medicine

## 2023-06-28 ENCOUNTER — Encounter: Payer: Self-pay | Admitting: Physician Assistant

## 2023-06-28 ENCOUNTER — Ambulatory Visit: Payer: 59 | Attending: Physician Assistant | Admitting: Physician Assistant

## 2023-06-28 VITALS — BP 112/72 | HR 95 | Ht 66.0 in | Wt 259.6 lb

## 2023-06-28 DIAGNOSIS — Z5189 Encounter for other specified aftercare: Secondary | ICD-10-CM | POA: Diagnosis not present

## 2023-06-28 DIAGNOSIS — Z95810 Presence of automatic (implantable) cardiac defibrillator: Secondary | ICD-10-CM

## 2023-06-28 DIAGNOSIS — I428 Other cardiomyopathies: Secondary | ICD-10-CM

## 2023-06-28 DIAGNOSIS — I472 Ventricular tachycardia, unspecified: Secondary | ICD-10-CM | POA: Diagnosis not present

## 2023-06-28 LAB — CUP PACEART INCLINIC DEVICE CHECK
Battery Remaining Longevity: 120 mo
Brady Statistic RV Percent Paced: 0.3 %
Date Time Interrogation Session: 20241226180901
HighPow Impedance: 70.875
Implantable Lead Connection Status: 753985
Implantable Lead Implant Date: 20241223
Implantable Lead Location: 753860
Implantable Lead Model: 7122
Implantable Pulse Generator Implant Date: 20241223
Lead Channel Impedance Value: 550 Ohm
Lead Channel Pacing Threshold Amplitude: 0.5 V
Lead Channel Pacing Threshold Amplitude: 0.5 V
Lead Channel Pacing Threshold Pulse Width: 0.5 ms
Lead Channel Pacing Threshold Pulse Width: 0.5 ms
Lead Channel Sensing Intrinsic Amplitude: 12 mV
Lead Channel Setting Pacing Amplitude: 3.5 V
Lead Channel Setting Pacing Pulse Width: 0.5 ms
Lead Channel Setting Sensing Sensitivity: 0.5 mV
Pulse Gen Serial Number: 111074223

## 2023-06-28 LAB — PROTIME-INR: INR: 3.5 — AB (ref 0.80–1.20)

## 2023-06-28 NOTE — Patient Instructions (Signed)
Medication Instructions:   Your physician recommends that you continue on your current medications as directed. Please refer to the Current Medication list given to you today.   *If you need a refill on your cardiac medications before your next appointment, please call your pharmacy*   Lab Work:  NONE ORDERED  TODAY    If you have labs (blood work) drawn today and your tests are completely normal, you will receive your results only by: MyChart Message (if you have MyChart) OR A paper copy in the mail If you have any lab test that is abnormal or we need to change your treatment, we will call you to review the results.   Testing/Procedures:  NONE ORDERED  TODAY     Follow-Up: At [facility], you and your health needs are our priority.  As part of our continuing mission to provide you with exceptional heart care, we have created designated Provider Care Teams.  These Care Teams include your primary Cardiologist (physician) and Advanced Practice Providers (APPs -  Physician Assistants and Nurse Practitioners) who all work together to provide you with the care you need, when you need it.  We recommend signing up for the patient portal called "MyChart".  Sign up information is provided on this After Visit Summary.  MyChart is used to connect with patients for Virtual Visits (Telemedicine).  Patients are able to view lab/test results, encounter notes, upcoming appointments, etc.  Non-urgent messages can be sent to your provider as well.   To learn more about what you can do with MyChart, go to [email or web address].    Your next appointment:  CONTINUE  AS SCHEDULED WITH CURRENT  APPOINTMENT   Other Instructions

## 2023-06-28 NOTE — Telephone Encounter (Signed)
Spoke to [employer], INR was 3.5 he had already taken his warfarin this afternoon. 4mg  Advised not to take any tomorrow and to re-check tomorrow mid-day, let me know the result They planned to have him eat a small side salad today. Site was stable today, advised to keep a close eye on it  Francis Dowse, [place]

## 2023-06-28 NOTE — Transitions of Care (Post Inpatient/ED Visit) (Signed)
   06/28/2023  Name: Roberto [name] MRN: [id number] DOB: 10/26/1955  Today's TOC FU Call Status: Today's TOC FU Call Status:: Unsuccessful Call (1st Attempt) Unsuccessful Call (1st Attempt) Date: 06/28/23  Attempted to reach the patient regarding the most recent Inpatient/ED visit.  Follow Up Plan: Additional outreach attempts will be made to reach the patient to complete the Transitions of Care (Post Inpatient/ED visit) call.   Deidre Ala, RN Medical illustrator [facility] [phone]

## 2023-06-28 NOTE — Telephone Encounter (Signed)
Caller Selena Batten) stated they checked patient's INR and it is at 3.5.  Caller wants to know next steps.

## 2023-06-29 ENCOUNTER — Telehealth: Payer: Self-pay

## 2023-06-29 ENCOUNTER — Telehealth: Payer: Self-pay | Admitting: Physician Assistant

## 2023-06-29 ENCOUNTER — Ambulatory Visit (INDEPENDENT_AMBULATORY_CARE_PROVIDER_SITE_OTHER): Payer: 59 | Admitting: Family Medicine

## 2023-06-29 DIAGNOSIS — Z952 Presence of prosthetic heart valve: Secondary | ICD-10-CM | POA: Diagnosis not present

## 2023-06-29 NOTE — Transitions of Care (Post Inpatient/ED Visit) (Signed)
06/29/2023  Name: Roberto [name] MRN: [id number] DOB: 06-04-1956  Today's TOC FU Call Status: Today's TOC FU Call Status:: Successful TOC FU Call Completed TOC FU Call Complete Date: 06/29/23 Patient's Name and Date of Birth confirmed.  Transition Care Management Follow-up Telephone Call Date of Discharge: 06/26/23 Discharge Facility: [facility]) Type of Discharge: Inpatient Admission Primary Inpatient Discharge Diagnosis:: "acute on chronic HF" How have you been since you were released from the hospital?: Better (S/W caregiver-[name]-states pt doing better-had appt "great MD appt this week"-checking INR in home-3.5 yest-ate side salad for dinner-3.3 this AM-will eat another salad today- cardiology office aware and adjusting med as neeed) Any questions or concerns?: No  Items Reviewed: Did you receive and understand the discharge instructions provided?: Yes Medications obtained,verified, and reconciled?: Partial Review Completed Reason for Partial Mediation Review: caregiver not at home with meds-she confirms she has all meds-reviewed new meds and she endorsed pt taking meds Any new allergies since your discharge?: No Dietary orders reviewed?: Yes Type of Diet Ordered:: low salt/heart healthy/carb modified Do you have support at home?: Yes People in Home: other relative(s) Name of Support/Comfort Primary Source: caregiver/friend-[name]  Medications Reviewed Today: Medications Reviewed Today   Medications were not reviewed in this encounter     Home Care and Equipment/Supplies: Were Home Health Services Ordered?: No Any new equipment or medical supplies ordered?: No (Caregiver would like pt to get rollator and shower chair -will discuss with provider during appt next week)  Functional Questionnaire: Do you need assistance with bathing/showering or dressing?: No Do you need assistance with meal preparation?: Yes (friend/CG assists) Do you need assistance with eating?: No Do  you have difficulty maintaining continence: No Do you need assistance with getting out of bed/getting out of a chair/moving?: No Do you have difficulty managing or taking your medications?: Yes (friend/CG assists)  Follow up appointments reviewed: PCP Follow-up appointment confirmed?: No (CG declined making PCP appt-states pt being followed closely by specialist and has advised pt that she does not need to see him per CG) MD Provider Line [phone] Given: No Specialist Hospital Follow-up appointment confirmed?: Yes Date of Specialist follow-up appointment?: 07/03/23 Follow-Up Specialty Provider:: Clarisa Kindred Do you need transportation to your follow-up appointment?: No Do you understand care options if your condition(s) worsen?: Yes-patient verbalized understanding  SDOH Interventions Today    Flowsheet Row Most Recent Value  SDOH Interventions   Food Insecurity Interventions Intervention Not Indicated  Housing Interventions Intervention Not Indicated  Transportation Interventions Intervention Not Indicated  Utilities Interventions Intervention Not Indicated      Interventions Today    Flowsheet Row Most Recent Value  Chronic Disease   Chronic disease during today's visit Diabetes, Congestive Heart Failure (CHF)  General Interventions   General Interventions Discussed/Reviewed General Interventions Discussed, Durable Medical Equipment (DME)  Durable Medical Equipment (DME) BP Cuff, Glucomoter, Other  [scale-discussed importance of monitoring values at home -recording/tracking and taking logs to MD appts]  Education Interventions   Education Provided Provided Education  Provided Verbal Education On Nutrition, Medication, When to see the doctor, Other  Nutrition Interventions   Nutrition Discussed/Reviewed Nutrition Discussed, Decreasing sugar intake, Decreasing salt, Increasing proteins, Adding fruits and vegetables, Decreasing fats  Pharmacy Interventions   Pharmacy  Dicussed/Reviewed Pharmacy Topics Discussed, Medications and their functions  Safety Interventions   Safety Discussed/Reviewed Safety Discussed, Home Safety  Home Safety Assistive Devices       TOC Interventions Today    Flowsheet Row Most Recent Value  TOC Interventions   TOC Interventions Discussed/Reviewed TOC Interventions Discussed       Antionette Fairy, RN,BSN,CCM RN Care Manager Transitions of Care  [place]  Direct Phone: [phone] Toll Free: [phone] Fax: [phone]

## 2023-06-29 NOTE — Telephone Encounter (Signed)
Spoke to [employer]  INR up some 3.5  Confirmed directions with her: No warfarin tonight or tomorrow INR Sunday morning, I will check in Sunday to see what it is She reports the site as unchanged from yesterday, looks good.  Francis Dowse, [name]

## 2023-06-29 NOTE — Telephone Encounter (Signed)
Pt ws seen in office 06/28/2023 and transmission received 06/26/2023.

## 2023-07-01 ENCOUNTER — Telehealth: Payer: Self-pay | Admitting: Physician Assistant

## 2023-07-01 LAB — PROTIME-INR: INR: 2.5 — AB (ref 0.80–1.20)

## 2023-07-01 NOTE — Telephone Encounter (Signed)
Attempts made to call the patient regarding his INR check today Only number available that I have reached him at in the past goes straight to a voicemail of a company [phone] is no longer in service.  Will try back  Discussed previously that they can send a mychart message with Today's INR reading for guidance on goals these first couple weeks post implant Friday was 3.5 Goal is 2-2.5 for now  Francis Dowse, [name]

## 2023-07-01 NOTE — Telephone Encounter (Signed)
Spoke to [employer] INR today is 2.6 Instructed to take 4mg  dose of warfarin  No bleeding or drainage Pt reports minimal aching at the site Selena Batten thinks perhaps there is a bit a swelling inferior to site, though not discolored. Will send a message to device clinic to follow up tomorrow, have him come in to look at the site.  They will also check INR tomorrow to guide dosing  Francis Dowse, [name]

## 2023-07-02 ENCOUNTER — Ambulatory Visit (INDEPENDENT_AMBULATORY_CARE_PROVIDER_SITE_OTHER): Payer: 59 | Admitting: Family Medicine

## 2023-07-02 ENCOUNTER — Ambulatory Visit: Payer: 59 | Attending: Cardiology

## 2023-07-02 ENCOUNTER — Telehealth: Payer: Self-pay

## 2023-07-02 DIAGNOSIS — Z952 Presence of prosthetic heart valve: Secondary | ICD-10-CM | POA: Diagnosis not present

## 2023-07-02 DIAGNOSIS — I428 Other cardiomyopathies: Secondary | ICD-10-CM

## 2023-07-02 LAB — PROTIME-INR: INR: 3.6 — AB (ref 0.80–1.20)

## 2023-07-02 MED FILL — Heparin Sod (Porcine) in NaCl IV Soln 25000 Unit/250ML-0.45%: INTRAVENOUS | Qty: 250 | Status: AC

## 2023-07-02 NOTE — Telephone Encounter (Signed)
Patient lives in [place] and requested to be seen at the [place] office. I contacted Baird Lyons, RN to see if his scheduled allowed. Device clinic apt made 07/02/23 @ 11:30 AM. Baird Lyons aware.  INR 2.4 ~ checked today at home.

## 2023-07-02 NOTE — Progress Notes (Signed)
Seen in clinic to reassess device site. Some mild swelling underneath device. Soft. Wife endorses it looks somewhat more swollen but unsure. INR between 2.4-2.6 today. They do @ home POC INR and couldn't recall exact number. Took 7mg  warfarin dose last night. SK MD assessed pt. Deemed okay to resume 4-4-7 dosing schedule. Will reassess @ 07/09/23 14day f/u to implant in device clinic.

## 2023-07-02 NOTE — Telephone Encounter (Signed)
-----   Message from Sheilah Pigeon sent at 07/01/2023 10:43 AM EST ----- Can you call him (or Selena Batten) have you guys look at his site Monday His INR got to 3. > 3.1 > 3.5  on Friday held warfarin   >> down to 2.6 today She thought there may be some swelling though inferior to the pocket.  Thanks [employer]

## 2023-07-02 NOTE — Progress Notes (Signed)
 Advanced Heart Failure Clinic Note   Referring Physician: recent admission PCP: Roberto Duwaine SQUIBB, DO (last seen 11/24) Cardiologist: Roberto Hanson, MD (last seen 10/24; returns 01/25)  Chief Complaint: Fatigue   HPI:  [name] is a 67 y/o male with a history of VT, atrial fibrillation of unknown duration, mechanical AVR (2011), T2DM, ascending aortic aneurysm (followed by [facility]), HTN, nonobstructive CAD, renal infarct (2017), CVA, hyperlipidemia, sleep apnea (not using CPAP), COPD, tobacco use and chronic heart failure.   Admitted to [facility] 06/17/23 with monomorphic VT. He converted to atrial fibrillation with IV amiodarone . R/LHC: nonobstructive CAD, severely elevated filling pressures and Fick CI 1.9. Echo with EF down to 25%, RV okay and severe TR. Advanced Heart Failure consulted. He was diuresed with IV lasix . EP consulted. He was transferred to [facility] for ICD placement prior to discharge from the hospital. Prior to placement cMRI was performed which showed EF 20% and mod LAE and severe RAE. Mild MR. Mod TR. Severe LVE and mild RVE. He underwent ICD insertion 06/25/23 and successful ATP through device, termination the VT. He remained in stable, rate controlled atrial fibrillation after procedure and has been scheduled for follow up and cardioversion with his cardiologist, Dr. Hanson in [place]. Vitals and labs stable for discharge.   Echo 01/02/19: EF 40-45%, mildly elevated PA pressure of 35.1 mmHg, moderate LAE, dilatation of ascending aorta 4.6 cm Echo 04/28/22: EF 35-40% with Grade II DD, mild LAE Echo 06/20/23: EF 20-25% with mild LVH, mildly elevated PA pressure, moderate LAE, mild MR, mod/severe TR, mechanical aortic valve with aortic mean gradient 8.0 mmHg  RHC 06/20/23: Mild to moderate, non-obstructive coronary artery disease with 60% D1 stenosis and 20% mid LAD and proximal RCA lesions.  No significant change noted from prior angiogram in 2020. Moderately elevated left heart  filling pressure (PCWP 30 mmHg with prominent v-waves). Severely elevated right heart filling pressure (RA 25, RVEDP 18 mmHg). Moderate pulmonary hypertension (PA 50/28, mean 35 mmHg). Moderately reduced Fick cardiac output/index (CO 4.4 L/min, CI 1.9 L/min/m^2).  cMRI 06/25/23: Moderate LAE. Severe RAE. No PFO/ASD. No pericardial effusion. 27 mm mechanical bileaflet carbomedics AVR. Normal leaflet motion with mild closing volume AR. Severe ascending thoracic aorta dilatation 5.5 cm. No dissection. Mild MV leaflet thickening with mild appearing MR. Moderate appearing TR. Severe LVE with global hypokinesis Mild RVE with mild RV hypokinesis Quantitative LVEF:20%. (EDV 333 cc ESV 265 cc SV 68 cc) Estimated cardiac output 3.9 L/min Quantitative RVEF: 41% (EDV 215 cc ESV 126 cc SV 88 cc ) Delayed gadolinium images showed mild area of basal septal mid myocardiac uptake/scar   He presents today for a post-hospital HF follow-up appointment with a chief complaint of moderate fatigue with minimal exertion. Chronic in nature. Has associated shortness of breath, chronic difficulty sleeping, snoring, intermittent dizziness, headaches and bilateral hand tremors which are worse with activity. Denies chest pain, cough, palpitations, abdominal distention or pedal edema. Hasn't been weighing daily but does have scales. Wore CPAP for a couple of days in the past but was unable to tolerate.   His friend/ caregiver, Roberto [name], is present with him today. She manages his medications for him. Has neurology appointment next month for his bilateral hand tremors.   Review of Systems: [y] = yes, [ ]  = no   General: Weight gain [ ] ; Weight loss [ ] ; Anorexia [ ] ; Fatigue Roberto [name] ]; Fever [ ] ; Chills [ ] ; Weakness [ ]   Cardiac: Chest pain/pressure [ ] ; Resting SOB [ ] ;  Exertional SOB Roberto [name] ]; Orthopnea [ ] ; Pedal Edema [ ] ; Palpitations [ ] ; Syncope [ ] ; Presyncope [ ] ; Paroxysmal nocturnal dyspnea[ ]   Pulmonary: Cough [ ] ; Wheezing[ ] ;  Hemoptysis[ ] ; Sputum [ ] ; Snoring Roberto [name] ]  GI: Vomiting[ ] ; Dysphagia[ ] ; Melena[ ] ; Hematochezia [ ] ; Heartburn[ ] ; Abdominal pain [ ] ; Constipation [ ] ; Diarrhea [ ] ; BRBPR [ ]   GU: Hematuria[ ] ; Dysuria [ ] ; Nocturia[ ]   Vascular: Pain in legs with walking [ ] ; Pain in feet with lying flat [ ] ; Non-healing sores [ ] ; Stroke [ ] ; TIA [ ] ; Slurred speech [ ] ;  Neuro: Headaches[y ]; Vertigo[ ] ; Seizures[ ] ; Paresthesias[ ] ;Blurred vision [ ] ; Tremors Roberto [name] ]; Dizziness Roberto [name] ]  Ortho/Skin: Arthritis [ ] ; Joint pain [ ] ; Muscle pain [ ] ; Joint swelling [ ] ; Back Pain [ ] ; Rash [ ]   Psych: Depression[ ] ; Anxiety[ ]   Heme: Bleeding problems [ ] ; Clotting disorders [ ] ; Anemia [ ]   Endocrine: Diabetes Roberto [name] ]; Thyroid  dysfunction[ ]    Past Medical History:  Diagnosis Date   Bicuspid aortic valve    a. s/p #27 Carbomedics mechanical valve on 03/25/2010; b. on Coumadin ; c. TTE 12/17: EF 40-45%, moderately dilated LV with moderate LVH, AVR well-seated with 14 mmHg gradient, peak AV velocity 2.5 m/s, mild mitral valve thickening with mild MR, mildly dilated RV with mildly reduced contraction   Cellulitis    Chronic kidney disease    Chronic systolic CHF (congestive heart failure) (HCC)    a. R/LHC 03/2010 showed no significant CAD, LVEDP 31 mmHg, mean AoV gradient 34 mmHg at rest and 47 mmHg with dobutamine 20 mcg/kg/min, AVA 1.0 cm^2, RA 31, RV 68/25, PA 68/47, PCWP 38. PA sat 65%. CO 6.2 L/min (Fick) and 5.3 L/min (thermodilution)   Clotting disorder (HCC)    H/O mechanical aortic valve replacement 03/25/2010   a. #27 Carbomedics mechanical valve   Hypercholesterolemia    Renal infarct [facility]) 2017   Multiple right renal infarcts, likely embolic.   Stroke [facility])    TIA (transient ischemic attack) 05/2014    Current Outpatient Medications  Medication Sig Dispense Refill   albuterol  (PROVENTIL ) (2.5 MG/3ML) 0.083% nebulizer solution USE 1 VIAL IN NEBULIZER EVERY 6 HOURS AS NEEDED FOR WHEEZING FOR SHORTNESS OF  BREATH 180 mL 1   allopurinol  (ZYLOPRIM ) 300 MG tablet Take 1 tablet (300 mg total) by mouth daily. 90 tablet 1   amiodarone  (PACERONE ) 200 MG tablet Take 2 tablets (400 mg total) by mouth 2 (two) times daily for 2 days, THEN 2 tablets (400 mg total) daily. 68 tablet 0   aspirin  81 MG chewable tablet Chew 81 mg by mouth daily.     buPROPion  (WELLBUTRIN  SR) 150 MG 12 hr tablet Take 1 pill in the AM for 3 days, then increase to 1 pill BID. Pick a day in the 2nd week to quit (Patient taking differently: 150 mg 2 (two) times daily. Take 1 pill in the AM for 3 days, then increase to 1 pill BID. Pick a day in the 2nd week to quit) 60 tablet 3   fenofibrate  (TRICOR ) 48 MG tablet Take 1 tablet (48 mg total) by mouth daily. 90 tablet 3   metFORMIN  (GLUCOPHAGE -XR) 500 MG 24 hr tablet Take 1 tablet (500 mg total) by mouth daily with breakfast. 90 tablet 1   nicotine  (NICODERM CQ ) 7 mg/24hr patch Place 1 patch (7 mg total) onto the skin daily. 28 patch 3   nortriptyline  (  PAMELOR ) 10 MG capsule Take 1 capsule (10 mg total) by mouth at bedtime. 90 capsule 1   Omega-3 Fatty Acids (FISH OIL ) 1000 MG CAPS Take 5,000 mg by mouth daily.     potassium chloride  SA (KLOR-CON  M) 20 MEQ tablet Take 1 tablet (20 mEq total) by mouth daily. 90 tablet 3   rosuvastatin  (CRESTOR ) 40 MG tablet Take 1 tablet (40 mg total) by mouth daily. 90 tablet 3   Semaglutide , 1 MG/DOSE, 4 MG/3ML SOPN Inject 1 mg as directed once a week. (Patient taking differently: Inject 1 mg as directed once a week. On Mondays) 3 mL 6   spironolactone  (ALDACTONE ) 25 MG tablet Take 1 tablet (25 mg total) by mouth daily.     torsemide  (DEMADEX ) 20 MG tablet Take 2 tablets (40 mg total) by mouth daily. 60 tablet 6   TRELEGY ELLIPTA  100-62.5-25 MCG/ACT AEPB Inhale 1 puff by mouth once daily 180 each 1   VENTOLIN  HFA 108 (90 Base) MCG/ACT inhaler INHALE 2 PUFFS BY MOUTH EVERY 6 HOURS AS NEEDED FOR WHEEZING OR SHORTNESS OF BREATH 54 g 6   warfarin (COUMADIN ) 4 MG  tablet TAKE 1 TABLET BY MOUTH ONCE DAILY AT  6PM  AS  DIRECTED     warfarin (COUMADIN ) 7.5 MG tablet TAKE 4MG  BY MOUTH FOR 2 DAYS, THEN TAKE 7.5MG  ON THE THIRD DAY, THEN REPEAT     No current facility-administered medications for this visit.    No Known Allergies    Social History   Socioeconomic History   Marital status: Single    Spouse name: Not on file   Number of children: 1   Years of education: 4   Highest education level: Associate degree: academic program  Occupational History   Occupation: Disabled    Employer: UNEMPLOYED  Tobacco Use   Smoking status: Former    Current packs/day: 0.00    Average packs/day: 0.5 packs/day for 46.0 years (23.0 ttl pk-yrs)    Types: Cigarettes    Start date: 07/16/2017    Quit date: 05/11/2019    Years since quitting: 4.1   Smokeless tobacco: Never  Vaping Use   Vaping status: Never Used  Substance and Sexual Activity   Alcohol use: No    Alcohol/week: 0.0 standard drinks of alcohol   Drug use: No   Sexual activity: Not Currently  Other Topics Concern   Not on file  Social History Narrative   Not on file   Social Drivers of Health   Financial Resource Strain: Low Risk  (08/21/2022)   Overall Financial Resource Strain (CARDIA)    Difficulty of Paying Living Expenses: Not very hard  Food Insecurity: No Food Insecurity (06/29/2023)   Hunger Vital Sign    Worried About Running Out of Food in the Last Year: Never true    Ran Out of Food in the Last Year: Never true  Transportation Needs: No Transportation Needs (06/29/2023)   PRAPARE - Administrator, Civil Service (Medical): No    Lack of Transportation (Non-Medical): No  Physical Activity: Inactive (08/21/2022)   Exercise Vital Sign    Days of Exercise per Week: 0 days    Minutes of Exercise per Session: 0 min  Stress: No Stress Concern Present (08/21/2022)   [employer] of Occupational Health - Occupational Stress Questionnaire    Feeling of Stress : Only a  little  Social Connections: Socially Isolated (08/21/2022)   Social Connection and Isolation Panel [NHANES]    Frequency  of Communication with Friends and Family: More than three times a week    Frequency of Social Gatherings with Friends and Family: Never    Attends Religious Services: Never    Database Administrator or Organizations: No    Attends Banker Meetings: Never    Marital Status: Divorced  Catering Manager Violence: Patient Unable To Answer (06/29/2023)   Humiliation, Afraid, Rape, and Kick questionnaire    Fear of Current or Ex-Partner: Patient unable to answer    Emotionally Abused: Patient unable to answer    Physically Abused: Patient unable to answer    Sexually Abused: Patient unable to answer      Family History  Problem Relation Age of Onset   Arthritis Mother    Dementia Mother    Colon cancer Mother    Arthritis Father    Diabetes Father    Stroke Father    Colon cancer Father    Heart attack Brother    Breast cancer Sister    Seizures Sister    Cancer Brother        brain   Heart disease Brother    Heart attack Brother    Vitals:   07/03/23 0847  BP: (!) 113/95  Pulse: 65  SpO2: 97%  Weight: 268 lb (121.6 kg)  Height: 5' 6 (1.676 m)   Wt Readings from Last 3 Encounters:  07/03/23 268 lb (121.6 kg)  06/28/23 259 lb 9.6 oz (117.8 kg)  06/26/23 260 lb 12.9 oz (118.3 kg)   Lab Results  Component Value Date   CREATININE 1.55 (H) 06/26/2023   CREATININE 1.63 (H) 06/25/2023   CREATININE 1.88 (H) 06/24/2023   PHYSICAL EXAM: General:  Well appearing. No respiratory difficulty HEENT: normal Neck: supple. no JVD. No lymphadenopathy or thyromegaly appreciated. Cor: PMI nondisplaced. Regular rate & irregular rhythm. No rubs, gallops or murmurs. Lungs: clear Abdomen: soft, nontender, nondistended. No hepatosplenomegaly. No bruits or masses.  Extremities: no cyanosis, clubbing, rash, edema Neuro: alert & oriented x 3, cranial nerves  grossly intact. moves all 4 extremities w/o difficulty. Affect pleasant.  ECG: atrial fibrillation HR 82 (personally reviewed)   ASSESSMENT & PLAN:  1: NICM with reduced ejection fraction- - suspect due to HTN as 06/20/23 cath showed nonobstructive disease - NYHA class III - euvolemic - instructed to start weighing daily and call for overnight weight gain of > 2 pounds or a weekly weight gain of > 5 pounds - Echo 06/20/23: EF 20-25% with mild LVH, mildly elevated PA pressure, moderate LAE, mild MR, mod/severe TR, mechanical aortic valve with aortic mean gradient 8.0 mmHg - cMRI 06/25/23: Moderate LAE. Severe RAE. No PFO/ASD. No pericardial effusion. 27 mm mechanical bileaflet carbomedics AVR. Normal leaflet motion with mild closing volume AR. Severe ascending thoracic aorta dilatation 5.5 cm. No dissection. Mild MV leaflet thickening with mild appearing MR. Moderate appearing TR. Severe LVE with global hypokinesis Mild RVE with mild RV hypokinesis Quantitative LVEF:20%. (EDV 333 cc ESV 265 cc SV 68 cc) Estimated cardiac output 3.9 L/min Quantitative RVEF: 41% (EDV 215 cc ESV 126 cc SV 88 cc ) Delayed gadolinium images showed mild area of basal septal mid myocardiac uptake/scar - continue potassium 20meq daily - continue spironolactone  25mg  daily - continue torsemide  40mg  daily - no beta-blocker due to low output; could consider at future visits - consider ARNi if BP/ renal function allows - BMET today; plan to start farxiga  if able after lab results obtained - BNP 06/17/23 was  224.7  2: HTN- - BP 113/95 - saw PCP Roberto [name]) 11/24 - saw nephrology Roberto [name]) 07/24 - BMET 06/26/23 showed sodium 135, potassium 3.7, creatinine 1.55 & GFR 48 - BMET today  3: VT- - Likely secondary to decompensated heart failure with severely elevated filling pressures and reduced cardiac index. Chemically converted to Afib with IV Amio - S/p single lead ICD placement and successful ATP w termination of VT  06/25/23. - continue amio 200 BID  - Mg today - saw EP Roberto [name]) 12/24  4: Mechanical AVR- - CarboMedics 27mm valve placed in 2011 for bicuspid valve disease - INR goal 2.5-3.5. INR  - INR 07/01/23 was 2.5 - INR 07/02/23 @ home was 2.4 - INR 07/03/23 @ home was 2.8  5: Atrial fibrillation-  - rate controlled - EKG today is atrial fibrillation, HR 82 - saw cardiology (Roberto [name]) 10/24 - On amio 200 bid - On warfarin, home INR monitoring  6: Aortic aneurysm-  - saw thoracic surgeon Roberto [name]) 08/24 with plan of CT scan ~ 02/25 - 5.7 cm fusiform ascending aortic aneurysm; high risk for re-do surgery. High risk for advanced therapies.   7: Tobacco use- - smoking 1/2 ppd cigarettes - quit in the past for 3 years - smokes more when stressed, broke out from using nicotine  patches  8: T2DM- - A1c 05/16/23 was 6.1% - continue metformin  XR 500mg  daily  9: Hyperlipidemia- - LDL 05/16/23 was 65 - lipo (a) 06/21/23 was 219.7 - continue fenofibrate  48mg  daily - continue rosuvastatin  40mg  daily  Return in 1 month, sooner if needed.

## 2023-07-03 ENCOUNTER — Encounter: Payer: Self-pay | Admitting: Family

## 2023-07-03 ENCOUNTER — Ambulatory Visit (INDEPENDENT_AMBULATORY_CARE_PROVIDER_SITE_OTHER): Payer: 59 | Admitting: Family Medicine

## 2023-07-03 ENCOUNTER — Telehealth: Payer: Self-pay | Admitting: Internal Medicine

## 2023-07-03 ENCOUNTER — Ambulatory Visit: Payer: 59 | Attending: Family | Admitting: Family

## 2023-07-03 VITALS — BP 113/95 | HR 65 | Ht 66.0 in | Wt 268.0 lb

## 2023-07-03 DIAGNOSIS — I10 Essential (primary) hypertension: Secondary | ICD-10-CM | POA: Diagnosis not present

## 2023-07-03 DIAGNOSIS — Z79899 Other long term (current) drug therapy: Secondary | ICD-10-CM | POA: Insufficient documentation

## 2023-07-03 DIAGNOSIS — I482 Chronic atrial fibrillation, unspecified: Secondary | ICD-10-CM

## 2023-07-03 DIAGNOSIS — I428 Other cardiomyopathies: Secondary | ICD-10-CM | POA: Insufficient documentation

## 2023-07-03 DIAGNOSIS — Z720 Tobacco use: Secondary | ICD-10-CM

## 2023-07-03 DIAGNOSIS — I130 Hypertensive heart and chronic kidney disease with heart failure and stage 1 through stage 4 chronic kidney disease, or unspecified chronic kidney disease: Secondary | ICD-10-CM | POA: Diagnosis not present

## 2023-07-03 DIAGNOSIS — N1831 Type 2 diabetes mellitus with diabetic chronic kidney disease: Secondary | ICD-10-CM

## 2023-07-03 DIAGNOSIS — I472 Ventricular tachycardia, unspecified: Secondary | ICD-10-CM

## 2023-07-03 DIAGNOSIS — E7841 Elevated Lipoprotein(a): Secondary | ICD-10-CM

## 2023-07-03 DIAGNOSIS — G4730 Sleep apnea, unspecified: Secondary | ICD-10-CM | POA: Insufficient documentation

## 2023-07-03 DIAGNOSIS — J449 Chronic obstructive pulmonary disease, unspecified: Secondary | ICD-10-CM | POA: Diagnosis not present

## 2023-07-03 DIAGNOSIS — Z95810 Presence of automatic (implantable) cardiac defibrillator: Secondary | ICD-10-CM | POA: Insufficient documentation

## 2023-07-03 DIAGNOSIS — Z7984 Long term (current) use of oral hypoglycemic drugs: Secondary | ICD-10-CM | POA: Diagnosis not present

## 2023-07-03 DIAGNOSIS — I2510 Atherosclerotic heart disease of native coronary artery without angina pectoris: Secondary | ICD-10-CM | POA: Diagnosis not present

## 2023-07-03 DIAGNOSIS — Z7901 Long term (current) use of anticoagulants: Secondary | ICD-10-CM | POA: Diagnosis not present

## 2023-07-03 DIAGNOSIS — Z952 Presence of prosthetic heart valve: Secondary | ICD-10-CM

## 2023-07-03 DIAGNOSIS — E1122 Type 2 diabetes mellitus with diabetic chronic kidney disease: Secondary | ICD-10-CM | POA: Insufficient documentation

## 2023-07-03 DIAGNOSIS — I7121 Aneurysm of the ascending aorta, without rupture: Secondary | ICD-10-CM | POA: Insufficient documentation

## 2023-07-03 DIAGNOSIS — Z87891 Personal history of nicotine dependence: Secondary | ICD-10-CM | POA: Insufficient documentation

## 2023-07-03 DIAGNOSIS — I5022 Chronic systolic (congestive) heart failure: Secondary | ICD-10-CM | POA: Insufficient documentation

## 2023-07-03 LAB — PROTIME-INR: INR: 2.8 — AB (ref 0.80–1.20)

## 2023-07-03 NOTE — Patient Instructions (Addendum)
 Go DOWN to LOWER LEVEL (LL) to have your blood work completed inside of [employer] office.  We will only call you if the results are abnormal or if the provider would like to make medication changes.   Begin weighing daily and call for an overnight weight gain of 3 pounds or more or a weekly weight gain of more than 5 pounds.  It was a pleasure meeting you today!!

## 2023-07-03 NOTE — Telephone Encounter (Signed)
Caller Marylene Land) stated she is following-up on getting assistance for this patient.

## 2023-07-04 LAB — BASIC METABOLIC PANEL
BUN/Creatinine Ratio: 19 (ref 10–24)
BUN: 25 mg/dL (ref 8–27)
CO2: 24 mmol/L (ref 20–29)
Calcium: 9.6 mg/dL (ref 8.6–10.2)
Chloride: 102 mmol/L (ref 96–106)
Creatinine, Ser: 1.34 mg/dL — ABNORMAL HIGH (ref 0.76–1.27)
Glucose: 82 mg/dL (ref 70–99)
Potassium: 4.7 mmol/L (ref 3.5–5.2)
Sodium: 140 mmol/L (ref 134–144)
eGFR: 58 mL/min/{1.73_m2} — ABNORMAL LOW (ref >59)

## 2023-07-04 LAB — PROTIME-INR: INR: 3.6 — AB (ref 0.80–1.20)

## 2023-07-04 LAB — MAGNESIUM: Magnesium: 2.1 mg/dL (ref 1.6–2.3)

## 2023-07-05 ENCOUNTER — Encounter: Payer: Self-pay | Admitting: Family

## 2023-07-05 ENCOUNTER — Telehealth: Payer: Self-pay

## 2023-07-05 LAB — PROTIME-INR: INR: 3.4 — AB (ref 0.80–1.20)

## 2023-07-05 MED ORDER — DAPAGLIFLOZIN PROPANEDIOL 10 MG PO TABS: 10.0000 mg | ORAL_TABLET | Freq: Every day | ORAL | 11 refills | Status: DC

## 2023-07-05 NOTE — Telephone Encounter (Signed)
 F/u from previous encounter. INR yesterday was 3.6. Pt took 4mg  instead of 7mg . Will call patient @ 11AM for updated INR.

## 2023-07-05 NOTE — Telephone Encounter (Signed)
-----   Message from Delma Freeze sent at 07/05/2023  8:19 AM EST ----- Kidney function has improved from hospital discharge. Begin farxiga 10mg  once daily and we will recheck labs at your next visit.

## 2023-07-05 NOTE — Telephone Encounter (Signed)
 INR 3.4 today. Supposed to take 7mg  today. Ate a salad. Sent to provider for review.

## 2023-07-05 NOTE — Telephone Encounter (Signed)
 No dose today per RU. I will f/u with pt tomorrow @ noon for INR.

## 2023-07-06 ENCOUNTER — Telehealth: Payer: Self-pay

## 2023-07-06 LAB — PROTIME-INR: INR: 3.2 — AB (ref 0.80–1.20)

## 2023-07-06 NOTE — Telephone Encounter (Signed)
 Reviewed w/ RU. INR 3.4 today. Site looks good per wife. Per RU dosing schedule as follows 4mg  today (Friday), 4mg  tomorrow (Saturday).  On Sunday recheck INR. If it is higher than 2.5 then take 4mg  and if it lower than 2.5 then resume normal schedule of 7mg  and we will recheck on Monday regardless.   Will f/u.

## 2023-07-07 ENCOUNTER — Other Ambulatory Visit: Payer: Self-pay | Admitting: Internal Medicine

## 2023-07-07 ENCOUNTER — Other Ambulatory Visit (HOSPITAL_COMMUNITY): Payer: Self-pay

## 2023-07-07 LAB — PROTIME-INR: INR: 2.9 — AB (ref 0.80–1.20)

## 2023-07-08 LAB — PROTIME-INR: INR: 2.9 — AB (ref 0.80–1.20)

## 2023-07-09 ENCOUNTER — Ambulatory Visit: Payer: 59 | Admitting: Student

## 2023-07-09 LAB — PROTIME-INR: INR: 2.5 — AB (ref 0.80–1.20)

## 2023-07-10 ENCOUNTER — Telehealth: Payer: Self-pay | Admitting: Internal Medicine

## 2023-07-10 ENCOUNTER — Telehealth: Payer: Self-pay

## 2023-07-10 ENCOUNTER — Ambulatory Visit: Payer: 59 | Attending: Cardiovascular Disease

## 2023-07-10 DIAGNOSIS — Z5189 Encounter for other specified aftercare: Secondary | ICD-10-CM | POA: Diagnosis not present

## 2023-07-10 LAB — PROTIME-INR: INR: 3 — AB (ref 0.80–1.20)

## 2023-07-10 MED ORDER — TORSEMIDE 20 MG PO TABS: 40.0000 mg | ORAL_TABLET | Freq: Every day | ORAL | 6 refills | Status: DC

## 2023-07-10 MED ORDER — AMIODARONE HCL 200 MG PO TABS: 400.0000 mg | ORAL_TABLET | Freq: Every day | ORAL | 0 refills | Status: DC

## 2023-07-10 MED ORDER — SPIRONOLACTONE 25 MG PO TABS: 25.0000 mg | ORAL_TABLET | Freq: Every day | ORAL | 3 refills | Status: DC

## 2023-07-10 NOTE — Telephone Encounter (Signed)
 Patient dropped disability parking placard card to be signed placed in box

## 2023-07-10 NOTE — Telephone Encounter (Signed)
 Pt reports taking spiro 25mg , torsemide 40 in the AM and 20 in the PM, and Amio 200mg  BID. Asking for med refills. These do not match the med list. Forwarding to MD

## 2023-07-10 NOTE — Patient Instructions (Addendum)
   After Your ICD (Implantable Cardiac Defibrillator)    Monitor your defibrillator site for redness, swelling, and drainage. Call the device clinic at [phone] if you experience these symptoms or fever/chills.  Your incision was closed with Steri-strips or staples:  You may shower 7 days after your procedure and wash your incision with soap and water . Avoid lotions, ointments, or perfumes over your incision until it is well-healed.  You may use a hot tub or a pool after your wound check appointment if the incision is completely closed.  Do not lift, push or pull greater than 10 pounds with the affected arm until 08/06/23 6 weeks after your procedure. There are no other restrictions in arm movement after your wound check appointment.  Your ICD is designed to protect you from life threatening heart rhythms. Because of this, you may receive a shock.   1 shock with no symptoms:  Call the office during business hours. 1 shock with symptoms (chest pain, chest pressure, dizziness, lightheadedness, shortness of breath, overall feeling unwell):  Call 911. If you experience 2 or more shocks in 24 hours:  Call 911. If you receive a shock, you should not drive.  [place] DMV - no driving for 6 months if you receive appropriate therapy from your ICD.   ICD Alerts:  Some alerts are vibratory and others beep. These are NOT emergencies. Please call our office to let us  know. If this occurs at night or on weekends, it can wait until the next business day. Send a remote transmission.  If your device is capable of reading fluid status (for heart failure), you will be offered monthly monitoring to review this with you.   Remote monitoring is used to monitor your ICD from home. This monitoring is scheduled every 91 days by our office. It allows us  to keep an eye on the functioning of your device to ensure it is working properly. You will routinely see your Electrophysiologist annually (more often if necessary).

## 2023-07-10 NOTE — Progress Notes (Signed)
 14 day wound check. Site WDL. Normal device function. Education provided. Meds refills sent for torsemide, amio and spiro.

## 2023-07-10 NOTE — Telephone Encounter (Signed)
 Please advise if ok to refill Spironolactone last filled by: Date: 06/22/2023 Department: [facility] Ordering/Authorizing: Charise Killian, MD

## 2023-07-11 ENCOUNTER — Ambulatory Visit: Payer: 59 | Admitting: Internal Medicine

## 2023-07-11 ENCOUNTER — Telehealth: Payer: Self-pay | Admitting: Home Health

## 2023-07-11 ENCOUNTER — Telehealth: Payer: Self-pay | Admitting: Internal Medicine

## 2023-07-11 ENCOUNTER — Other Ambulatory Visit: Payer: Self-pay | Admitting: Surgery

## 2023-07-11 DIAGNOSIS — I7121 Aneurysm of the ascending aorta, without rupture: Secondary | ICD-10-CM

## 2023-07-11 LAB — PROTIME-INR: INR: 3 — AB (ref 0.80–1.20)

## 2023-07-11 NOTE — Telephone Encounter (Signed)
*  STAT* If patient is at the pharmacy, call can be transferred to refill team.   1. Which medications need to be refilled? (please list name of each medication and dose if known) amiodarone  (PACERONE ) 200 MG tablet    2. Would you like to learn more about the convenience, safety, & potential cost savings by using the [facility]?     3. Are you open to using the [facility] (Type [facility]. ).   4. Which pharmacy/location (including street and city if local pharmacy) is medication to be sent to? [id number] - [place] ([address]), [place] - [address]    5. Do they need a 30 day or 90 day supply? 90

## 2023-07-11 NOTE — Telephone Encounter (Signed)
 Refill sent/received on 07/10/23.  Confirmed with pharmacy that rx was received on 07/10/23.  Message left informing patient.

## 2023-07-11 NOTE — Telephone Encounter (Signed)
 Patient's caregiver [name] called after hour line, reporting was told PT medicine would not get felt and tell 1/17, was told she was going to get it tonight.  Called patient back at [phone], voicemail left for patient to call back for clarification.

## 2023-07-11 NOTE — Progress Notes (Signed)
 Cardiology Office Note:  .   Date:  07/12/2023  ID:  Roberto [name], DOB 08-10-1955, MRN [id number] PCP: [name], DO  [facility] Providers Cardiologist:  [name], MD Sleep Medicine:  Wilbert Bihari, MD     History of Present Illness: .   Tyleek Wyatt is a 68 y.o. male with history of mechanical aortic valve replacement, non-obstructive coronary artery disease, thoracic aortic aneurysm (followed by [facility]), stroke, right renal infarct, chronic systolic heart failure (LVEF 20% by cardiac MRI in 06/2023) complicated by VT s/p ICD, persistent atrial fibrillation, hyperlipidemia, obstructive sleep apnea not using CPAP, and morbid obesity, who presents for follow-up of HFrEF, thoracic aortic aneurysm, valvular heart disease, and CAD.  He was hospitalized last month with sustained ventricular tachycardia and found to have interval decline in his LVEF.  Catheterization showed stable nonobstructive CAD.  Atrial fibrillation was also noted.  He underwent single-chamber ICD placement and was maintained on warfarin.  Today, Mr. Ganoe reports that he is feeling well without chest pain, shortness of breath, palpitations, lightheadedness, or edema.  The spironolactone  is on his medication list, he notes that he does not have any more of this at home.  He remains on his other medications, as prescribed, though he is about to run out of amiodarone .  He is scheduled for follow-up in the heart failure clinic later this month.  ROS: See HPI  Studies Reviewed: .        Cardiac MRI (06/25/2023): Severely dilated left ventricle with LVEF of 20%.  Delayed gadolinium images show basal septal mid myocardial hyperenhancement consistent with nonischemic process.  Mildly dilated RV with hypokinesis and RVEF of 41%.  Severe right and moderate left atrial enlargement.  Severely dilated thoracic aortic aneurysm measuring up to 5.5 cm.  R/LHC (06/20/2023): Mild to moderate, nonobstructive CAD with 60%  D1 stenosis and 20% mid LAD and proximal RCA lesions similar to prior angiogram from 2020.  Moderately elevated left heart filling pressure (PCWP 30 mmHg).  Severely elevated right heart filling pressures (RA 25, RVEDP 18 mmHg).  Moderate pulmonary hypertension (PA 50/28, mean 35 mmHg).  Moderately reduced Fick cardiac output/index (CO 4.4 L/min, CI 1.9 L/min/m).  TTE (06/20/2023): Mildly dilated left ventricle with mild LVH.  LVEF 20-25% with global hypokinesis.  Normal RV size with moderately reduced function.  Mild pulmonary hypertension.  Moderate left atrial enlargement.  No pericardial effusion.  Mild mitral regurgitation.  Moderate to severe tricuspid regurgitation.  Mechanical aortic valve present with mean gradient 14 mmHg.  Risk Assessment/Calculations:    CHA2DS2-VASc Score = 5   This indicates a 7.2% annual risk of stroke. The patient's score is based upon: CHF History: 1 HTN History: 0 Diabetes History: 0 Stroke History: 2 Vascular Disease History: 1 Age Score: 1 Gender Score: 0            Physical Exam:   VS:  BP 112/70 (BP Location: Left Arm, Patient Position: Sitting, Cuff Size: Large)   Pulse 96   Ht 5' 6 (1.676 m)   Wt 263 lb 3.2 oz (119.4 kg)   SpO2 96%   BMI 42.48 kg/m    Wt Readings from Last 3 Encounters:  07/12/23 263 lb 3.2 oz (119.4 kg)  07/03/23 268 lb (121.6 kg)  06/28/23 259 lb 9.6 oz (117.8 kg)    General:  NAD. Neck: No JVD or HJR. Lungs: Clear to auscultation bilaterally without wheezes or crackles. Heart: Irregularly irregular without murmurs.  Mechanical S2 noted. Abdomen:  Soft, nontender, nondistended. Extremities: No lower extremity edema.  ASSESSMENT AND PLAN: .    Chronic HFrEF due to nonischemic cardiomyopathy: Mr. Cindia appears euvolemic with stable NYHA class III symptoms.  He would like to start exercising again, which I think would be very beneficial in the long-term.  However, until his ICD pocket has completely healed and his  ventricular rates/atrial fibrillation better controlled, I have asked him to defer his exercise regimen.  We will add back spironolactone  12.5 mg daily and continue dapagliflozin  10 mg daily.  I will check a BMP today.  He will need follow-up labs later this month as well to ensure stable renal function and potassium.  His blood pressure has been chronically low though it seems a bit better today.  I have asked him to check his blood pressure at home; if readings are similar to today's blood pressure, addition of an ACE inhibitor/ARB and/or beta-blocker could be considered at his follow-up visit in the advanced heart failure clinic later this month.  Persistent atrial fibrillation/flutter: EKG today again shows atrial flutter with borderline ventricular rate in the upper 90s.  Given the lack of symptoms and historically low blood pressure, I will defer adding a beta-blocker at this time.  Continue amiodarone  400 mg daily given recent sustained VT.  Continue warfarin for anticoagulation with mechanical aortic valve.  INRs have previously been managed by Dr. Vicci, though Mr. Cindia wishes to transition this to our office.  I will check with our anticoagulation team to see if they can follow his INRs, which he checks at home.  Once his INR has been therapeutic for at least 3 to 4 weeks (lasts subtherapeutic reading on 06/23/2023), elective cardioversion to be considered.  This can be readdressed at his follow-up visit with Dr. Gardenia later this month.  Ventricular tachycardia: No palpitations noted.  No device shocks reported.  Continue amiodarone  400 mg daily and ongoing follow-up with the EP and the device clinic.  Coronary artery disease: Stable nonobstructive disease involving diagonal branch noted on recent catheterization.  Continue aspirin  and rosuvastatin  to prevent progression of disease.  Hyperlipidemia associated with type 2 diabetes mellitus: Continue rosuvastatin  40 mg daily.  Ongoing  management of DM per Dr. Vicci.  Thoracic aortic aneurysm: Large ascending aortic aneurysm again noted on recent MRI, though it was measured to be slightly smaller than prior CTs.  Patient is scheduled for follow-up with Dr. Lucas in the cardiac surgery clinic in February.  Morbid obesity: BMI remains greater than 40.  Long-term, I hope [name] can begin exercising again though he will need to defer this until his ICD pocket has healed and we have better control of his atrial fibrillation/flutter.   Dispo: Follow-up with advanced heart failure clinic as scheduled on 1/28; return to see me in ~3 months.  Signed, [name], MD

## 2023-07-12 ENCOUNTER — Other Ambulatory Visit: Payer: Self-pay

## 2023-07-12 ENCOUNTER — Ambulatory Visit: Payer: 59 | Attending: Internal Medicine | Admitting: Internal Medicine

## 2023-07-12 ENCOUNTER — Telehealth: Payer: Self-pay | Admitting: Family Medicine

## 2023-07-12 ENCOUNTER — Encounter: Payer: Self-pay | Admitting: Internal Medicine

## 2023-07-12 ENCOUNTER — Ambulatory Visit (INDEPENDENT_AMBULATORY_CARE_PROVIDER_SITE_OTHER): Payer: 59 | Admitting: Family Medicine

## 2023-07-12 VITALS — BP 112/70 | HR 96 | Ht 66.0 in | Wt 263.2 lb

## 2023-07-12 DIAGNOSIS — I428 Other cardiomyopathies: Secondary | ICD-10-CM

## 2023-07-12 DIAGNOSIS — I472 Ventricular tachycardia, unspecified: Secondary | ICD-10-CM

## 2023-07-12 DIAGNOSIS — I5022 Chronic systolic (congestive) heart failure: Secondary | ICD-10-CM | POA: Diagnosis not present

## 2023-07-12 DIAGNOSIS — I4819 Other persistent atrial fibrillation: Secondary | ICD-10-CM

## 2023-07-12 DIAGNOSIS — Z952 Presence of prosthetic heart valve: Secondary | ICD-10-CM

## 2023-07-12 DIAGNOSIS — Z79899 Other long term (current) drug therapy: Secondary | ICD-10-CM

## 2023-07-12 DIAGNOSIS — E785 Hyperlipidemia, unspecified: Secondary | ICD-10-CM

## 2023-07-12 DIAGNOSIS — E1169 Type 2 diabetes mellitus with other specified complication: Secondary | ICD-10-CM

## 2023-07-12 DIAGNOSIS — I2510 Atherosclerotic heart disease of native coronary artery without angina pectoris: Secondary | ICD-10-CM

## 2023-07-12 DIAGNOSIS — I7121 Aneurysm of the ascending aorta, without rupture: Secondary | ICD-10-CM

## 2023-07-12 LAB — PROTIME-INR: INR: 3.2 — AB (ref 0.80–1.20)

## 2023-07-12 MED ORDER — AMIODARONE HCL 200 MG PO TABS: 400.0000 mg | ORAL_TABLET | Freq: Every day | ORAL | 1 refills | Status: DC

## 2023-07-12 MED ORDER — SPIRONOLACTONE 25 MG PO TABS: 12.5000 mg | ORAL_TABLET | Freq: Every day | ORAL | 3 refills | Status: DC

## 2023-07-12 NOTE — Telephone Encounter (Signed)
 Copied from CRM [id number]. Topic: General - Other >> Jul 11, 2023  4:34 PM Ja-Kwan M wrote: Reason for CRM: [name] stated they they were told to contact patient's pcp to request a Respironics (anti static valve holding chamber) another name for it is [employer]. Cb# [id number]

## 2023-07-12 NOTE — Patient Instructions (Signed)
 Medication Instructions:   Start Spironolactone  12.5 mg daily  Stop potassium  *If you need a refill on your cardiac medications before your next appointment, please call your pharmacy*   Lab Work: Your provider would like for you to have following labs drawn today BMET.    If you have labs (blood work) drawn today and your tests are completely normal, you will receive your results only by: MyChart Message (if you have MyChart) OR A paper copy in the mail If you have any lab test that is abnormal or we need to change your treatment, we will call you to review the results.   Testing/Procedures: None ordered today.    Follow-Up: At [facility], you and your health needs are our priority.  As part of our continuing mission to provide you with exceptional heart care, we have created designated Provider Care Teams.  These Care Teams include your primary Cardiologist (physician) and Advanced Practice Providers (APPs -  Physician Assistants and Nurse Practitioners) who all work together to provide you with the care you need, when you need it.  We recommend signing up for the patient portal called MyChart.  Sign up information is provided on this After Visit Summary.  MyChart is used to connect with patients for Virtual Visits (Telemedicine).  Patients are able to view lab/test results, encounter notes, upcoming appointments, etc.  Non-urgent messages can be sent to your provider as well.   To learn more about what you can do with MyChart, go to [email or web address].    Your next appointment:   3 month(s)  Provider:   You may see [name], MD or one of the following Advanced Practice Providers on your designated Care Team:   [name] Meager, NP Bernardino Bring, [name] Cadence Franchester, [name] Tylene Lunch, NP Barnie Hila, NP

## 2023-07-13 ENCOUNTER — Telehealth: Payer: Self-pay | Admitting: Family Medicine

## 2023-07-13 ENCOUNTER — Ambulatory Visit (INDEPENDENT_AMBULATORY_CARE_PROVIDER_SITE_OTHER): Payer: 59 | Admitting: Family Medicine

## 2023-07-13 ENCOUNTER — Other Ambulatory Visit: Payer: Self-pay | Admitting: Internal Medicine

## 2023-07-13 DIAGNOSIS — Z952 Presence of prosthetic heart valve: Secondary | ICD-10-CM | POA: Diagnosis not present

## 2023-07-13 LAB — BASIC METABOLIC PANEL
BUN/Creatinine Ratio: 11 (ref 10–24)
BUN: 20 mg/dL (ref 8–27)
CO2: 25 mmol/L (ref 20–29)
Calcium: 9.4 mg/dL (ref 8.6–10.2)
Chloride: 101 mmol/L (ref 96–106)
Creatinine, Ser: 1.76 mg/dL — ABNORMAL HIGH (ref 0.76–1.27)
Glucose: 95 mg/dL (ref 70–99)
Potassium: 3.9 mmol/L (ref 3.5–5.2)
Sodium: 144 mmol/L (ref 134–144)
eGFR: 42 mL/min/{1.73_m2} — ABNORMAL LOW (ref >59)

## 2023-07-13 LAB — PROTIME-INR: INR: 2.8 — AB (ref 0.80–1.20)

## 2023-07-13 MED ORDER — TORSEMIDE 20 MG PO TABS: 20.0000 mg | ORAL_TABLET | Freq: Every day | ORAL | Status: DC

## 2023-07-13 MED ORDER — SPACER/AERO-HOLDING CHAMBERS DEVI: 1 refills | Status: DC

## 2023-07-13 NOTE — Telephone Encounter (Signed)
 Pt's friend called in for lab results. Please cb when notes are in. She is also checking on status of pt getting a resperonics

## 2023-07-13 NOTE — Telephone Encounter (Signed)
 Called and notified Roberto [name] that order for the Respironics chamber has been sent to [facility].

## 2023-07-14 ENCOUNTER — Other Ambulatory Visit: Payer: Self-pay

## 2023-07-14 DIAGNOSIS — I509 Heart failure, unspecified: Secondary | ICD-10-CM | POA: Diagnosis not present

## 2023-07-14 DIAGNOSIS — R791 Abnormal coagulation profile: Secondary | ICD-10-CM | POA: Insufficient documentation

## 2023-07-14 DIAGNOSIS — I4891 Unspecified atrial fibrillation: Secondary | ICD-10-CM | POA: Diagnosis not present

## 2023-07-14 LAB — CBC
HCT: 43 % (ref 39.0–52.0)
Hemoglobin: 13.6 g/dL (ref 13.0–17.0)
MCH: 28.4 pg (ref 26.0–34.0)
MCHC: 31.6 g/dL (ref 30.0–36.0)
MCV: 89.8 fL (ref 80.0–100.0)
Platelets: 236 10*3/uL (ref 150–400)
RBC: 4.79 MIL/uL (ref 4.22–5.81)
RDW: 15.8 % — ABNORMAL HIGH (ref 11.5–15.5)
WBC: 8.7 10*3/uL (ref 4.0–10.5)
nRBC: 0 % (ref 0.0–0.2)

## 2023-07-14 LAB — URINALYSIS, ROUTINE W REFLEX MICROSCOPIC
Bacteria, UA: NONE SEEN
Bilirubin Urine: NEGATIVE
Glucose, UA: >=500 mg/dL — AB
Ketones, ur: NEGATIVE mg/dL
Leukocytes,Ua: NEGATIVE
Nitrite: NEGATIVE
Protein, ur: NEGATIVE mg/dL
Specific Gravity, Urine: 1.025 (ref 1.005–1.030)
pH: 5 (ref 5.0–8.0)

## 2023-07-14 LAB — BASIC METABOLIC PANEL
Anion gap: 15 (ref 5–15)
BUN: 28 mg/dL — ABNORMAL HIGH (ref 8–23)
CO2: 27 mmol/L (ref 22–32)
Calcium: 9.9 mg/dL (ref 8.9–10.3)
Chloride: 101 mmol/L (ref 98–111)
Creatinine, Ser: 1.65 mg/dL — ABNORMAL HIGH (ref 0.61–1.24)
GFR, Estimated: 45 mL/min — ABNORMAL LOW (ref >60)
Glucose, Bld: 100 mg/dL — ABNORMAL HIGH (ref 70–99)
Potassium: 3.8 mmol/L (ref 3.5–5.1)
Sodium: 143 mmol/L (ref 135–145)

## 2023-07-14 LAB — PROTIME-INR
INR: 3.9 — ABNORMAL HIGH (ref 0.8–1.2)
Prothrombin Time: 38.5 s — ABNORMAL HIGH (ref 11.4–15.2)

## 2023-07-14 NOTE — ED Triage Notes (Signed)
 Pt states that he checked his INR at home and 3.2 today, rechecked it and it went up to 4.5, pt takes coumadin for artifical heart valve. Pt reports that he has been having dizziness today

## 2023-07-15 ENCOUNTER — Emergency Department
Admission: EM | Admit: 2023-07-15 | Discharge: 2023-07-15 | Discharge status: Against Medical Advice | Payer: 59 | Attending: Emergency Medicine | Admitting: Emergency Medicine

## 2023-07-15 DIAGNOSIS — R791 Abnormal coagulation profile: Secondary | ICD-10-CM

## 2023-07-15 LAB — TROPONIN I (HIGH SENSITIVITY)
Troponin I (High Sensitivity): 47 ng/L — ABNORMAL HIGH (ref <18)
Troponin I (High Sensitivity): 52 ng/L — ABNORMAL HIGH (ref <18)

## 2023-07-15 LAB — PROTIME-INR
INR: 4.1 (ref 0.8–1.2)
Prothrombin Time: 39.8 s — ABNORMAL HIGH (ref 11.4–15.2)

## 2023-07-15 NOTE — ED Notes (Signed)
 Unable to interrogate patient's [employer] pacemaker at this time d/t to pt's device being incompatible with interrogation device.

## 2023-07-15 NOTE — ED Notes (Signed)
 Pt and family have wanted to leave multiple times but were convinced to stay and continue treatment.  Pt now demanding IV be taken out and that they are leaving.  Pt was given results of PT INR prior to leaving.  Pt also informed that second troponin had resulted and MD would be over to talk to them as soon as she's done discussing with another patient.  Pt refused to wait to talk to doctor.  Pt A&Ox4, chest rise even and unlabored, skin WNL and ambulatory with steady gait when leaving.
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
## 2023-07-16 ENCOUNTER — Encounter: Payer: 59 | Admitting: Physician Assistant

## 2023-07-16 ENCOUNTER — Encounter: Payer: 59 | Admitting: Cardiology

## 2023-07-16 ENCOUNTER — Telehealth: Payer: Self-pay

## 2023-07-16 ENCOUNTER — Other Ambulatory Visit: Payer: Self-pay | Admitting: Emergency Medicine

## 2023-07-16 ENCOUNTER — Other Ambulatory Visit: Payer: Self-pay | Admitting: *Deleted

## 2023-07-16 DIAGNOSIS — Z79899 Other long term (current) drug therapy: Secondary | ICD-10-CM

## 2023-07-16 MED ORDER — AMIODARONE HCL 200 MG PO TABS: 400.0000 mg | ORAL_TABLET | Freq: Two times a day (BID) | ORAL | 1 refills | Status: DC

## 2023-07-16 NOTE — Telephone Encounter (Signed)
-----   Message from [name] Bolk sent at 07/16/2023  9:31 AM EST ----- Regarding: RE: Anticoagulation management Yes, we can take over INR monitoring. However we can not enroll any more Self Testing patients. Will need to come in to clinic. Will have [facility] reach out to schedule in [place] ----- Message ----- From: Leverne Charlies Helling, [name] Sent: 07/16/2023   8:56 AM EST To: [name], MD; Cv Div Pharmd Subject: RE: Anticoagulation management                  I am completely on board our coumadin  team/clinic.   I was micro-managing his warfarin after his ICD placement to try and avoid hematoma, had been doing great and turned it back over to the PMD.  The patient and wife are very aware and reliable.  I would not object at all to our team taking over.  Will ask device clinic to follow up on his pocket (though has been 3 weeks since implant so hopefully pocket is ok) and get a transmission for his dizzy spell since ER was unable to interrogate)  Charlies ----- Message ----- From: Hanson [name], MD Sent: 07/16/2023   7:00 AM EST To: Charlies Helling Leverne, [name]; Cv Div Pharmd Subject: Anticoagulation management                     Good morning,  I wanted to see if it would be possible to transition [name]'s warfarin management to our office.  It had been managed with home INR's through his PCP's office.  However, following his hospitalization and ICD placement in December, our EP group has been following his INR's.  At my visit with him last week, he expressed interest in our anticoagulation clinic assuming care for his warfarin (though he would like to continuing doing home INR checks).  It looks like he went to the ED over the weekend with a supratherapeutic INR and was instructed to hold warfarin until further notice.  Is it possible to get him established in our [facility] clinic?  I will cc Charlies Leverne, since she had been following his INR's most recently as well for her thoughts.   Thanks.  [place]

## 2023-07-16 NOTE — Telephone Encounter (Signed)
 Alert received from [employer] for sustained VT with successful ATP therapy 1/11 & 1/12. There have been 4 VT-1 classified events with 2-3 bursts of ATP, longest duration 1hr , HR's 150's.  EGM's c/w SVT and sustained VT. 10 NSVT and 1 SVT. 12 NSVT arrhythmias detected.  The was one VT event that was successfully converted after 2 ATP events detected.  Spoke to patients friend [name] and patient who advises pt reports of lightheaded and headaches over the weekend. Went to ED, were unable to interrogate device, patient left prior to discharge or inpatient decision made. Reviewed EGM's and confirmed with Renee/ Del who all agree VT. Verbal order obtained to increase Amiodarone  400 mg BID, script sent to pharmacy. Pt & friend who assist pt with meds made aware of medication change. Offered apt today with Renee at 2:45 pm, pt unable to come d/t no transportation. Pt needs apt. Friday 07/20/23 d/t transportation. Apt made with Dr. Waddell (DOD) 07/20/23 @ 8:00 AM. Pt made aware and will call if unable to make it. ED precautions given to patient if ANY symptoms of chest pain, dizziness, lightheaded, shortness of breath or any concerning symptoms.

## 2023-07-16 NOTE — Telephone Encounter (Addendum)
 Called and spoke with pt and pt's friend Roberto [name]. Roberto [name] states she has managed pt's INR for 9 years and has nursing experience although she is not technically a nurse. I made her and patient aware our coumadin  clinic is very particular and manages pt's INR very closely. We ask that when Roberto [name] becomes our patient that he takes Warfarin as directed only. Roberto [name] continued to states she doses his Warfarin for him because she has been doing it for 9 years and he does very well. I continued to educate/explain the risk of self dosing/managing and how we do not allow self testing. Roberto [name] states pt's current Warfarin dose is 4mg , 4mg , then 7.5mg  and repeats this dosage. I made her aware our Coumadin  clinic recommends a set weekly Warfarin dosing schedule. Pt is planning to hold Warfarin today and then resume normal dose tomorrow. I educated pt and Roberto [name] for over 15 mins over the phone. Scheduled coumadin  clinic appt for Friday, 07/20/23 at 2:30pm. Requested a Friday appt at [facility] office [facility] only sees pts on Wednesday). Can switch to [facility] in the future.

## 2023-07-17 LAB — PROTIME-INR
INR: 2.8 — AB (ref 0.80–1.20)
INR: 3.2 — AB (ref 0.80–1.20)

## 2023-07-17 NOTE — Telephone Encounter (Signed)
 Placard completed and provided to pt at office visit on 07/12/23

## 2023-07-18 ENCOUNTER — Telehealth: Payer: Self-pay

## 2023-07-18 ENCOUNTER — Telehealth: Payer: Self-pay | Admitting: Neurology

## 2023-07-18 ENCOUNTER — Ambulatory Visit: Payer: Self-pay | Admitting: Family Medicine

## 2023-07-18 DIAGNOSIS — Z952 Presence of prosthetic heart valve: Secondary | ICD-10-CM

## 2023-07-18 LAB — PROTIME-INR
INR: 3 — AB (ref 0.80–1.20)
INR: 3.5 — AB (ref 0.80–1.20)

## 2023-07-18 NOTE — Telephone Encounter (Signed)
 Called and scheduled pt to see dr. Gracie Lav

## 2023-07-18 NOTE — Telephone Encounter (Signed)
 [name] called back to speak to [place]. [name] requesting call back, [phone]

## 2023-07-18 NOTE — Telephone Encounter (Signed)
 Ok to double book at 1030am,might have to wait

## 2023-07-18 NOTE — Telephone Encounter (Signed)
 Dr. Gracie Lav,  I was able to schedule for Tuesday but they would prefer to do Fridays Is there any way that you can see him on 07/27/23? Thanks,  Production assistant, radio

## 2023-07-18 NOTE — Telephone Encounter (Signed)
 Referral was placed in Oct 2024, it is ok to find an earlier appt if we can.

## 2023-07-18 NOTE — Telephone Encounter (Signed)
 Pt's caregiver states pt's tremors are really bad to the point he can barely eat. They had to r/s his initial appt due to having other appts at the sam time. Rescheduled to first available 4/22, asking to be seen sooner. Requesting to call back

## 2023-07-18 NOTE — Telephone Encounter (Signed)
 Call to caregiver [name], She reports tremors are worse since having pacemaker placed in December. Needing more assistance with ADL's. Had to cancel 1/21 appointment due to conflict and rescheduled 4/22. Caregiver requesting sooner appointment. They are seeing cardiologist Friday and I advised to report symptoms to them and I would see if Dr. Gracie Lav felt she needed to see him sooner. Caregiver appreciative of call

## 2023-07-18 NOTE — Telephone Encounter (Signed)
 Called to complete transition of care call and [name] requested that I call back after 4 pm today when she would be back at his home to assist.

## 2023-07-18 NOTE — Telephone Encounter (Signed)
 SPOKE to kim and r/s to Friday 07/27/23

## 2023-07-19 LAB — PROTIME-INR
INR: 3.2 — AB (ref 0.80–1.20)
INR: 3.6 — AB (ref 0.80–1.20)

## 2023-07-20 ENCOUNTER — Ambulatory Visit: Payer: 59 | Attending: Internal Medicine | Admitting: Internal Medicine

## 2023-07-20 ENCOUNTER — Ambulatory Visit: Payer: 59

## 2023-07-20 ENCOUNTER — Encounter: Payer: 59 | Admitting: Internal Medicine

## 2023-07-20 ENCOUNTER — Encounter: Payer: 59 | Admitting: Pulmonary Disease

## 2023-07-20 ENCOUNTER — Encounter: Payer: Self-pay | Admitting: Internal Medicine

## 2023-07-20 ENCOUNTER — Other Ambulatory Visit: Payer: Self-pay | Admitting: *Deleted

## 2023-07-20 VITALS — BP 112/88 | HR 116 | Ht 66.0 in | Wt 266.0 lb

## 2023-07-20 DIAGNOSIS — I472 Ventricular tachycardia, unspecified: Secondary | ICD-10-CM | POA: Diagnosis not present

## 2023-07-20 DIAGNOSIS — I5022 Chronic systolic (congestive) heart failure: Secondary | ICD-10-CM | POA: Diagnosis not present

## 2023-07-20 DIAGNOSIS — Z79899 Other long term (current) drug therapy: Secondary | ICD-10-CM

## 2023-07-20 LAB — CUP PACEART INCLINIC DEVICE CHECK
Battery Remaining Longevity: 120 mo
Brady Statistic RV Percent Paced: 0.04 %
Date Time Interrogation Session: 20250117102912
HighPow Impedance: 63 Ohm
Implantable Lead Connection Status: 753985
Implantable Lead Implant Date: 20241223
Implantable Lead Location: 753860
Implantable Lead Model: 7122
Implantable Pulse Generator Implant Date: 20241223
Lead Channel Impedance Value: 500 Ohm
Lead Channel Sensing Intrinsic Amplitude: 12 mV
Lead Channel Setting Pacing Amplitude: 3.5 V
Lead Channel Setting Pacing Pulse Width: 0.5 ms
Lead Channel Setting Sensing Sensitivity: 0.5 mV
Pulse Gen Serial Number: 111074223

## 2023-07-20 LAB — PROTIME-INR: INR: 4.4 — AB (ref 0.80–1.20)

## 2023-07-20 NOTE — Patient Instructions (Addendum)
Medication Instructions:  Your physician recommends that you continue on your current medications as directed. Please refer to the Current Medication list given to you today.  *If you need a refill on your cardiac medications before your next appointment, please call your pharmacy*  Lab Work: None ordered.  If you have labs (blood work) drawn today and your tests are completely normal, you will receive your results only by: MyChart Message (if you have MyChart) OR A paper copy in the mail If you have any lab test that is abnormal or we need to change your treatment, we will call you to review the results.  Testing/Procedures: Cardio conversion to be scheduled   Follow-Up: At [facility], you and your health needs are our priority.  As part of our continuing mission to provide you with exceptional heart care, we have created designated Provider Care Teams.  These Care Teams include your primary Cardiologist (physician) and Advanced Practice Providers (APPs -  Physician Assistants and Nurse Practitioners) who all work together to provide you with the care you need, when you need it.  Your next appointment:   To be scheduled  The format for your next appointment:   In Person  Provider:   Lewayne Bunting, MD{or one of the following Advanced Practice Providers on your designated Care Team:   Francis Dowse, [place] Casimiro Needle "Mardelle Matte" [place], [place] Earnest Rosier, NP  Remote monitoring is used to monitor your Pacemaker/ ICD from home. This monitoring reduces the number of office visits required to check your device to one time per year. It allows [place] to keep an eye on the functioning of your device to ensure it is working properly.   Important Information About Sugar

## 2023-07-20 NOTE — Progress Notes (Signed)
HPI Roberto [name] returns today for followup. He is a pleasant 68 yo man with a h/o persistent atrial fib with a  RVR, VT, and mechanical aortic valve replacement. He has sever LV dysfunction. He has sleep apnea and has been on amiodarone at fairly high dose. He has had to reduce his dose of warfarin. The patient denies chest pain. He has chronic class 2 dyspnea. His wife checks the iNR at home since DC from the hospital and his INR has been above 2.  No Known Allergies   Current Outpatient Medications  Medication Sig Dispense Refill   albuterol (PROVENTIL) (2.5 MG/3ML) 0.083% nebulizer solution USE 1 VIAL IN NEBULIZER EVERY 6 HOURS AS NEEDED FOR WHEEZING FOR SHORTNESS OF BREATH 180 mL 1   allopurinol (ZYLOPRIM) 300 MG tablet Take 1 tablet (300 mg total) by mouth daily. 90 tablet 1   amiodarone (PACERONE) 200 MG tablet Take 2 tablets (400 mg total) by mouth 2 (two) times daily for 180 doses. 180 tablet 1   aspirin 81 MG chewable tablet Chew 81 mg by mouth daily.     buPROPion (WELLBUTRIN SR) 150 MG 12 hr tablet Take 1 pill in the AM for 3 days, then increase to 1 pill BID. Pick a day in the 2nd week to quit (Patient taking differently: 150 mg 2 (two) times daily. Take 1 pill in the AM for 3 days, then increase to 1 pill BID. Pick a day in the 2nd week to quit) 60 tablet 3   dapagliflozin propanediol (FARXIGA) 10 MG TABS tablet Take 1 tablet (10 mg total) by mouth daily before breakfast. 30 tablet 11   fenofibrate (TRICOR) 48 MG tablet Take 1 tablet (48 mg total) by mouth daily. 90 tablet 3   metFORMIN (GLUCOPHAGE-XR) 500 MG 24 hr tablet Take 1 tablet (500 mg total) by mouth daily with breakfast. 90 tablet 1   nortriptyline (PAMELOR) 10 MG capsule Take 1 capsule (10 mg total) by mouth at bedtime. 90 capsule 1   Omega-3 Fatty Acids (FISH OIL) 1000 MG CAPS Take 5,000 mg by mouth daily.     rosuvastatin (CRESTOR) 40 MG tablet Take 1 tablet (40 mg total) by mouth daily. 90 tablet 3    Semaglutide, 1 MG/DOSE, 4 MG/3ML SOPN Inject 1 mg as directed once a week. (Patient taking differently: Inject 1 mg as directed once a week. On Mondays) 3 mL 6   Spacer/Aero-Holding [facility] Use as directed Dx: COPD, J44.9 1 each 1   spironolactone (ALDACTONE) 25 MG tablet Take 0.5 tablets (12.5 mg total) by mouth daily. 45 tablet 3   torsemide (DEMADEX) 20 MG tablet Take 1 tablet (20 mg total) by mouth daily.     TRELEGY ELLIPTA 100-62.5-25 MCG/ACT AEPB Inhale 1 puff by mouth once daily 180 each 1   VENTOLIN HFA 108 (90 Base) MCG/ACT inhaler INHALE 2 PUFFS BY MOUTH EVERY 6 HOURS AS NEEDED FOR WHEEZING OR SHORTNESS OF BREATH 54 g 6   warfarin (COUMADIN) 4 MG tablet TAKE 1 TABLET BY MOUTH ONCE DAILY AT  6PM  AS  DIRECTED     warfarin (COUMADIN) 7.5 MG tablet TAKE 4MG  BY MOUTH FOR 2 DAYS, THEN TAKE 7.5MG  ON THE THIRD DAY, THEN REPEAT     No current facility-administered medications for this visit.     Past Medical History:  Diagnosis Date   Bicuspid aortic valve    a. s/p #27 [employer] mechanical valve on 03/25/2010; b. on Coumadin; c.  TTE 12/17: EF 40-45%, moderately dilated LV with moderate LVH, AVR well-seated with 14 mmHg gradient, peak AV velocity 2.5 m/s, mild mitral valve thickening with mild MR, mildly dilated RV with mildly reduced contraction   Cellulitis    Chronic kidney disease    Chronic systolic CHF (congestive heart failure) (HCC)    a. R/LHC 03/2010 showed no significant CAD, LVEDP 31 mmHg, mean AoV gradient 34 mmHg at rest and 47 mmHg with dobutamine 20 mcg/kg/min, AVA 1.0 cm^2, RA 31, RV 68/25, PA 68/47, PCWP 38. PA sat 65%. CO 6.2 L/min (Fick) and 5.3 L/min (thermodilution)   Clotting disorder (HCC)    H/O mechanical aortic valve replacement 03/25/2010   a. #27 Carbomedics mechanical valve   Hypercholesterolemia    Renal infarct [facility]) 2017   Multiple right renal infarcts, likely embolic.   Stroke [facility])    TIA (transient ischemic attack) 05/2014    ROS:   All  systems reviewed and negative except as noted in the HPI.   Past Surgical History:  Procedure Laterality Date   AORTIC VALVE REPLACEMENT     CARDIAC CATHETERIZATION  03/21/2010   No significant CAD. Severe aortic stenosis. Severely elevated left and right heart filling pressures.   CARDIAC SURGERY  2009   CHF   CARPAL TUNNEL RELEASE Left 2005   ICD IMPLANT N/A 06/25/2023   Procedure: ICD IMPLANT;  Surgeon: Marinus Maw, MD;  Location: [facility] INVASIVE CV LAB;  Service: Cardiovascular;  Laterality: N/A;   RIGHT HEART CATH AND CORONARY ANGIOGRAPHY N/A 01/28/2019   Procedure: RIGHT HEART CATH AND CORONARY ANGIOGRAPHY;  Surgeon: Yvonne Kendall, MD;  Location: [facility] INVASIVE CV LAB;  Service: Cardiovascular;  Laterality: N/A;   RIGHT HEART CATH AND CORONARY ANGIOGRAPHY N/A 06/20/2023   Procedure: RIGHT HEART CATH AND CORONARY ANGIOGRAPHY;  Surgeon: Yvonne Kendall, MD;  Location: [facility] INVASIVE CV LAB;  Service: Cardiovascular;  Laterality: N/A;   TONSILLECTOMY  1962     Family History  Problem Relation Age of Onset   Arthritis Mother    Dementia Mother    Colon cancer Mother    Arthritis Father    Diabetes Father    Stroke Father    Colon cancer Father    Heart attack Brother    Breast cancer Sister    Seizures Sister    Cancer Brother        brain   Heart disease Brother    Heart attack Brother      Social History   Socioeconomic History   Marital status: Single    Spouse name: Not on file   Number of children: 1   Years of education: 4   Highest education level: Associate degree: academic program  Occupational History   Occupation: Disabled    Employer: UNEMPLOYED  Tobacco Use   Smoking status: Former    Current packs/day: 0.00    Average packs/day: 0.5 packs/day for 46.0 years (23.0 ttl pk-yrs)    Types: Cigarettes    Start date: 07/16/2017    Quit date: 05/11/2019    Years since quitting: 4.1   Smokeless tobacco: Never  Vaping Use   Vaping status: Never Used   Substance and Sexual Activity   Alcohol use: No    Alcohol/week: 0.0 standard drinks of alcohol   Drug use: No   Sexual activity: Not Currently  Other Topics Concern   Not on file  Social History Narrative   Not on file   Social Drivers of Health  Financial Resource Strain: Low Risk  (08/21/2022)   Overall Financial Resource Strain (CARDIA)    Difficulty of Paying Living Expenses: Not very hard  Food Insecurity: No Food Insecurity (06/29/2023)   Hunger Vital Sign    Worried About Running Out of Food in the Last Year: Never true    Ran Out of Food in the Last Year: Never true  Transportation Needs: No Transportation Needs (06/29/2023)   PRAPARE - Administrator, Civil Service (Medical): No    Lack of Transportation (Non-Medical): No  Physical Activity: Inactive (08/21/2022)   Exercise Vital Sign    Days of Exercise per Week: 0 days    Minutes of Exercise per Session: 0 min  Stress: No Stress Concern Present (08/21/2022)   [employer] of Occupational Health - Occupational Stress Questionnaire    Feeling of Stress : Only a little  Social Connections: Socially Isolated (08/21/2022)   Social Connection and Isolation Panel [NHANES]    Frequency of Communication with Friends and Family: More than three times a week    Frequency of Social Gatherings with Friends and Family: Never    Attends Religious Services: Never    Database administrator or Organizations: No    Attends Banker Meetings: Never    Marital Status: Divorced  Catering manager Violence: Patient Unable To Answer (06/29/2023)   Humiliation, Afraid, Rape, and Kick questionnaire    Fear of Current or Ex-Partner: Patient unable to answer    Emotionally Abused: Patient unable to answer    Physically Abused: Patient unable to answer    Sexually Abused: Patient unable to answer     BP 112/88   Pulse (!) 116   Ht 5\' 6"  (1.676 m)   Wt 266 lb (120.7 kg)   SpO2 98%   BMI 42.93 kg/m    Physical Exam:  Well appearing NAD HEENT: Unremarkable Neck:  No JVD, no thyromegally Lymphatics:  No adenopathy Back:  No CVA tenderness Lungs:  Clear with minimal basilar rales. HEART:  IRegular rate rhythm, no murmurs, no rubs, no clicks; mechanical S2.  Abd:  soft, positive bowel sounds, no organomegally, no rebound, no guarding Ext:  2 plus pulses, no edema, no cyanosis, no clubbing Skin:  No rashes no nodules Neuro:  CN II through XII intact, motor grossly intact  EKG - atrial fib with RVR and LBBB  DEVICE  Normal device function.  See PaceArt for details.   Assess/Plan: Uncontrolled atrial fib - he will remain on high dose amio and we will have him in to undergo DCCV in a few weeks. I'll wean down the dose of amio to 400 mg daily for another couple of months and then down further.  Chronic systolic heart failure -his symptoms are class 2B. He should do better back in rhythm.  Obesity - after we get him back to NSR, he will be encouraged to lose weight. VT - stable on amio.  [name] Daana Petrasek,MD

## 2023-07-21 ENCOUNTER — Other Ambulatory Visit: Payer: Self-pay | Admitting: Family Medicine

## 2023-07-21 LAB — BASIC METABOLIC PANEL
BUN/Creatinine Ratio: 13 (ref 10–24)
BUN: 20 mg/dL (ref 8–27)
CO2: 22 mmol/L (ref 20–29)
Calcium: 9.7 mg/dL (ref 8.6–10.2)
Chloride: 105 mmol/L (ref 96–106)
Creatinine, Ser: 1.52 mg/dL — ABNORMAL HIGH (ref 0.76–1.27)
Glucose: 88 mg/dL (ref 70–99)
Potassium: 4.4 mmol/L (ref 3.5–5.2)
Sodium: 143 mmol/L (ref 134–144)
eGFR: 50 mL/min/{1.73_m2} — ABNORMAL LOW (ref >59)

## 2023-07-21 LAB — PROTIME-INR: INR: 4.5 — AB (ref 0.80–1.20)

## 2023-07-22 LAB — PROTIME-INR: INR: 4 — AB (ref 0.80–1.20)

## 2023-07-23 ENCOUNTER — Ambulatory Visit (INDEPENDENT_AMBULATORY_CARE_PROVIDER_SITE_OTHER): Payer: 59 | Admitting: Family Medicine

## 2023-07-23 DIAGNOSIS — Z952 Presence of prosthetic heart valve: Secondary | ICD-10-CM

## 2023-07-23 LAB — PROTIME-INR: INR: 4 — AB (ref 0.80–1.20)

## 2023-07-23 NOTE — Telephone Encounter (Signed)
Requested medication (s) are due for refill today: review  Requested medication (s) are on the active medication list: yes  Last refill:  04/10/23 #60/3  Future visit scheduled: yes  Notes to clinic:  unsure if pt suppose to be taking or not by directions. Please review for refill.  Patient taking differently: 150 mg 2 (two) times daily. Take 1 pill in the AM for 3 days, then increase to 1 pill BID. Pick a day in the 2nd week to quit      Requested Prescriptions  Pending Prescriptions Disp Refills   buPROPion (WELLBUTRIN SR) 150 MG 12 hr tablet [Pharmacy Med Name: buPROPion HCl ER (SR) 150 MG Oral Tablet Extended Release 12 Hour] 60 tablet 0    Sig: TAKE 1 TABLET BY MOUTH IN THE MORNING FOR 3 DAYS, THEN INCREASE TO 1 TABLET TWICE DAILY. PICK A DAY IN THE SECOND WEEK TO QUIT.     Psychiatry: Antidepressants - bupropion Failed - 07/23/2023 11:52 AM      Failed - Cr in normal range and within 360 days    Creatinine  Date Value Ref Range Status  05/25/2014 0.85 0.60 - 1.30 mg/dL Final   Creatinine, Ser  Date Value Ref Range Status  07/20/2023 1.52 (H) 0.76 - 1.27 mg/dL Final         Failed - AST in normal range and within 360 days    AST  Date Value Ref Range Status  06/17/2023 183 (H) 15 - 41 U/L Final   SGOT(AST)  Date Value Ref Range Status  05/24/2014 22 15 - 37 Unit/L Final   AST (SGOT) Piccolo, Waived  Date Value Ref Range Status  05/17/2017 31 11 - 38 U/L Final         Failed - ALT in normal range and within 360 days    ALT  Date Value Ref Range Status  06/17/2023 113 (H) 0 - 44 U/L Final   SGPT (ALT)  Date Value Ref Range Status  05/24/2014 20 U/L Final    Comment:    14-63 NOTE: New Reference Range 01/20/14    ALT (SGPT) Piccolo, Waived  Date Value Ref Range Status  05/17/2017 47 10 - 47 U/L Final         Passed - Completed PHQ-2 or PHQ-9 in the last 360 days      Passed - Last BP in normal range    BP Readings from Last 1 Encounters:  07/20/23  112/88         Passed - Valid encounter within last 6 months    Recent Outpatient Visits           2 months ago Routine general medical examination at a health care facility   [facility] [place], [name], DO   3 months ago Tobacco abuse   [place] [facility], [name], DO   4 months ago Anticoagulated on Coumadin   [place] [facility] [place], Sherran Needs, NP   5 months ago Anticoagulated on Coumadin   [place] [facility] [place], Sherran Needs, NP   5 months ago Anticoagulated on Coumadin   [place] [facility] [place], Sherran Needs, NP       Future Appointments             In 2 months End, Cristal Deer, MD [facility] [place]   In 3 months [name], DO [facility],  PEC

## 2023-07-23 NOTE — Telephone Encounter (Signed)
Can we please get a follow up

## 2023-07-24 ENCOUNTER — Ambulatory Visit: Payer: Medicare Other | Admitting: Neurology

## 2023-07-24 LAB — PROTIME-INR: INR: 4.2 — AB (ref 0.80–1.20)

## 2023-07-26 ENCOUNTER — Ambulatory Visit: Payer: Self-pay

## 2023-07-26 ENCOUNTER — Other Ambulatory Visit: Payer: Self-pay | Admitting: Family Medicine

## 2023-07-26 LAB — PROTIME-INR: INR: 4 — AB (ref 0.80–1.20)

## 2023-07-26 NOTE — Telephone Encounter (Signed)
  Chief Complaint: URI Symptoms: cough with congestion, tightness in chest, SOB at times, nasal congestion, ears popping Frequency: 1 week  Pertinent Negatives: Patient denies fever Disposition: [] ED /[] Urgent Care (no appt availability in office) / [x] Appointment(In office/virtual)/ []  [facility]/ [] Home Care/ [] Refused Recommended Disposition /[] [place] [facility]/ []  Follow-up with PCP Additional Notes: spoke with Selena Batten, she was returning call for pt needing appt for medication change. She states that pt has above sx x 1 week and has been using inhaler as needed. As been taking Coricidin HBP for sx x 4 days but not helping. She is wanting to schedule VV, appt scheduled for tomorrow at 0800 with Dr. Evelene Croon. Advised can address nicotine cessation medication as well since the bupropion isn't working for the pt. She states the patches aren't working either and they have had to stop those d/t causing itching and scratching.    Reason for Disposition  Earache  Answer Assessment - Initial Assessment Questions 2. ONSET: "When did the sinus pain start?"  (e.g., hours, days)      1 week  3. SEVERITY: "How bad is the pain?"   (Scale 1-10; mild, moderate or severe)   - MILD (1-3): doesn't interfere with normal activities    - MODERATE (4-7): interferes with normal activities (e.g., work or school) or awakens from sleep   - SEVERE (8-10): excruciating pain and patient unable to do any normal activities        Mild to moderate  5. NASAL CONGESTION: "Is the nose blocked?" If Yes, ask: "Can you open it or must you breathe through your mouth?"     yes 6. NASAL DISCHARGE: "Do you have discharge from your nose?" If so ask, "What color?"     Not really a lot  7. FEVER: "Do you have a fever?" If Yes, ask: "What is it, how was it measured, and when did it start?"      no 8. OTHER SYMPTOMS: "Do you have any other symptoms?" (e.g., sore throat, cough, earache, difficulty breathing)     Tightness  in chest, SOB at times, cough with congestion  Protocols used: Sinus Pain or Congestion-A-AH

## 2023-07-26 NOTE — Telephone Encounter (Signed)
Reordered 07/25/23 #60   Requested Prescriptions  Refused Prescriptions Disp Refills   buPROPion (WELLBUTRIN SR) 150 MG 12 hr tablet [Pharmacy Med Name: buPROPion HCl ER (SR) 150 MG Oral Tablet Extended Release 12 Hour] 60 tablet 0    Sig: TAKE 1 TABLET BY MOUTH IN THE MORNING FOR 3 DAYS, THEN INCREASE TO 1 TABLET TWICE DAILY. PICK A DAY IN THE SECOND WEEK TO QUIT.     Psychiatry: Antidepressants - bupropion Failed - 07/26/2023 11:20 AM      Failed - Cr in normal range and within 360 days    Creatinine  Date Value Ref Range Status  05/25/2014 0.85 0.60 - 1.30 mg/dL Final   Creatinine, Ser  Date Value Ref Range Status  07/20/2023 1.52 (H) 0.76 - 1.27 mg/dL Final         Failed - AST in normal range and within 360 days    AST  Date Value Ref Range Status  06/17/2023 183 (H) 15 - 41 U/L Final   SGOT(AST)  Date Value Ref Range Status  05/24/2014 22 15 - 37 Unit/L Final   AST (SGOT) Piccolo, Waived  Date Value Ref Range Status  05/17/2017 31 11 - 38 U/L Final         Failed - ALT in normal range and within 360 days    ALT  Date Value Ref Range Status  06/17/2023 113 (H) 0 - 44 U/L Final   SGPT (ALT)  Date Value Ref Range Status  05/24/2014 20 U/L Final    Comment:    14-63 NOTE: New Reference Range 01/20/14    ALT (SGPT) Piccolo, Waived  Date Value Ref Range Status  05/17/2017 47 10 - 47 U/L Final         Passed - Completed PHQ-2 or PHQ-9 in the last 360 days      Passed - Last BP in normal range    BP Readings from Last 1 Encounters:  07/20/23 112/88         Passed - Valid encounter within last 6 months    Recent Outpatient Visits           2 months ago Routine general medical examination at a health care facility   [facility] [place], [name], DO   3 months ago Tobacco abuse   [facility] [place], [name], DO   5 months ago Anticoagulated on Coumadin   [place] [facility] [place],  Sherran Needs, NP   5 months ago Anticoagulated on Coumadin   [place] [facility] [place], Sherran Needs, NP   5 months ago Anticoagulated on Coumadin   [place] [facility] [place], Sherran Needs, NP       Future Appointments             In 2 months End, Cristal Deer, MD [facility] [place]   In 3 months [name], DO [place] [facility], [facility]

## 2023-07-26 NOTE — Patient Instructions (Addendum)
I sent mycelex oral solution to take 5 times daily for the next 7 days  I sent the extended release buproprion in hopes for better coverage of his cravings. I will look into seeing if we can get him coverage for his nicotine gum.  I able to tolerate, hold off on using the trellegy while we treat the trush use your albuterol inhaler as needed  Please schedule your Medicare Annual Wellness exam on or after 08/22/2023.

## 2023-07-26 NOTE — Telephone Encounter (Signed)
Pt's caregiver Selena Batten, reports pt is sick.  She would like to do a my chart vv for tomorrow's appointment.  [employer] will not allow phone rep to schedule the my chart vv.  Phone rep asking RN to advise if this appointment can or can not be made as a my chart vv, once advised phone rep is willing to call caregiver and advise.

## 2023-07-26 NOTE — Telephone Encounter (Signed)
LMOM for pt to call the office and  schedule a follow up appt with provider

## 2023-07-26 NOTE — Telephone Encounter (Signed)
New patient evaluation, I prefer in person visit

## 2023-07-27 ENCOUNTER — Telehealth (INDEPENDENT_AMBULATORY_CARE_PROVIDER_SITE_OTHER): Payer: 59 | Admitting: Pediatrics

## 2023-07-27 ENCOUNTER — Ambulatory Visit: Payer: Medicare Other | Admitting: Neurology

## 2023-07-27 ENCOUNTER — Encounter: Payer: Self-pay | Admitting: Pediatrics

## 2023-07-27 VITALS — BP 128/93 | HR 115 | Temp 96.5°F | Wt 256.0 lb

## 2023-07-27 DIAGNOSIS — Z1211 Encounter for screening for malignant neoplasm of colon: Secondary | ICD-10-CM

## 2023-07-27 DIAGNOSIS — F17210 Nicotine dependence, cigarettes, uncomplicated: Secondary | ICD-10-CM

## 2023-07-27 DIAGNOSIS — I4891 Unspecified atrial fibrillation: Secondary | ICD-10-CM | POA: Diagnosis not present

## 2023-07-27 DIAGNOSIS — E1159 Type 2 diabetes mellitus with other circulatory complications: Secondary | ICD-10-CM

## 2023-07-27 DIAGNOSIS — B370 Candidal stomatitis: Secondary | ICD-10-CM

## 2023-07-27 DIAGNOSIS — R251 Tremor, unspecified: Secondary | ICD-10-CM | POA: Diagnosis not present

## 2023-07-27 DIAGNOSIS — Z1330 Encounter for screening examination for mental health and behavioral disorders, unspecified: Secondary | ICD-10-CM

## 2023-07-27 DIAGNOSIS — J069 Acute upper respiratory infection, unspecified: Secondary | ICD-10-CM | POA: Diagnosis not present

## 2023-07-27 LAB — PROTIME-INR: INR: 3.9 — AB (ref 0.80–1.20)

## 2023-07-27 MED ORDER — CLOTRIMAZOLE 10 MG MT TROC: 10.0000 mg | Freq: Every day | OROMUCOSAL | 0 refills | Status: AC

## 2023-07-27 MED ORDER — BUPROPION HCL ER (XL) 300 MG PO TB24: 300.0000 mg | ORAL_TABLET | Freq: Every day | ORAL | 3 refills | Status: DC

## 2023-07-27 NOTE — Progress Notes (Unsigned)
Telehealth Visit  I connected with  Roberto [name] on 08/02/23 by a video enabled telemedicine application and verified that I am speaking with the correct person using two identifiers.   I discussed the limitations of evaluation and management by telemedicine. The patient expressed understanding and agreed to proceed.  Subjective:    Patient ID: Roberto [name], male    DOB: 30-Aug-1955, 68 y.o.   MRN: [id number]  HPI: Roberto [name] is a 68 y.o. male  Chief Complaint  Patient presents with   URI    Possible thrush- white spots all over mouth and no taste started 3 days ago. Smoking cessation discussion, smoking 5 a day.    Coagulation Disorder    Resulted today at 3.9   Tremors    States they are now increased and effecting her right hand. He is becoming increasingly frustrated due to how hard it is to do some things, especially eating since things tend to fall off the utensils.     Discussed the use of AI scribe software for clinical note transcription with the patient, who gave verbal consent to proceed.  History of Present Illness   The patient presents with a chief complaint of oral discomfort, characterized by a burning sensation, dryness, and white spots on the tongue and lips. The symptoms have led to a change in dietary habits, with the patient avoiding certain foods due to exacerbation of the burning sensation. The patient's symptoms are suggestive of oral thrush, a condition he has experienced in the past.  In addition to the oral discomfort, the patient has been experiencing ear blockages and fatigue. The patient has a history of atrial fibrillation (Afib), which has been causing significant stress due to upcoming cardioversion.  The patient has a history of smoking and has been attempting to quit using patches and bupropion. However, the patches have caused skin irritation to the point of bleeding, and the patient continues to smoke five to six cigarettes a day.  The  patient also reports tremors, which have worsened to the point of interfering with daily activities such as eating and writing. The tremors have been present for several years but have recently increased in severity. The patient is currently on amiodarone for his Afib, which may be contributing to the worsening of the tremors.  The patient has a pacemaker-defibrillator implanted and is being monitored for heart rate and INR due to his Afib. The patient's heart rate has been consistently high, and he is scheduled to see a cardiologist for further management.  The patient has also been experiencing body aches and a decrease in appetite, which may be related to a current cold-like illness. The patient's oxygen level is reported to be 98%.     Relevant past medical, surgical, family and social history reviewed and updated as indicated. Interim medical history since our last visit reviewed. Allergies and medications reviewed and updated.  ROS per HPI unless specifically indicated above     Objective:    BP (!) 128/93   Pulse (!) 115   Temp (!) 96.5 F (35.8 C) (Temporal)   Wt 256 lb (116.1 kg)   SpO2 98%   BMI 41.32 kg/m   Wt Readings from Last 3 Encounters:  08/02/23 260 lb 6.4 oz (118.1 kg)  08/01/23 262 lb (118.8 kg)  07/27/23 256 lb (116.1 kg)     Physical Exam Constitutional:      General: He is not in acute distress.    Appearance: He is normal  weight. He is not ill-appearing.  Pulmonary:     Effort: Pulmonary effort is normal.  Neurological:     General: No focal deficit present.     Mental Status: He is alert. Mental status is at baseline.  Psychiatric:        Mood and Affect: Mood normal.        Behavior: Behavior normal.     LIMITED EXAM GIVEN VIDEO VISIT     Assessment & Plan:  Assessment & Plan   Atrial fibrillation, unspecified type [facility]) Assessment & Plan: Persistent symptoms of fatigue, increased stress due to upcoming cardioversion. I am concerned about  how high his heart rate is. Has upcoming visit with cardiology. On amiodarone, losartan, torsemide. Pt given strict return precautions and anticipatory guidance for ED. May have URI and or have dehydration from reduced eating from suspected thrush.  -Continue current management and follow up with cardiology as scheduled.   Tremor of left hand Assessment & Plan: Worsening of pre-existing tremors, difficulty eating and signing name. Possible exacerbation due to Amiodarone. -Communicate with cardiology regarding tremors and possible medication adjustment.   Nicotine dependence, cigarettes, uncomplicated Assessment & Plan: Reduced smoking from a pack to 5-6 cigarettes per day. History of adverse reactions to nicotine patches and Chantix. Currently on Bupropion twice daily. -Switch Bupropion to extended release formulation. -Time spent on counseling: 3 minutes.  Orders: -     buPROPion HCl ER (XL); Take 1 tablet (300 mg total) by mouth daily.  Dispense: 90 tablet; Refill: 3  Thrush Upper respiratory tract infection, unspecified type White spots on tongue and lips, burning sensation, altered taste, and dry mouth. History of recurrent thrush. Symptoms of body aches, loss of appetite, and ear popping. Negative at-home COVID test. Pt given strict return precautions and anticipatory guidance for ED. -Attempt to arrange in-home swab for flu and COVID testing. -Start oral antifungal treatment. -     POC Influenza A&B(BINAX/QUICKVUE) -     Clotrimazole; Take 1 tablet (10 mg total) by mouth 5 (five) times daily for 7 days.  Dispense: 35 tablet; Refill: 0  Follow up plan: Return if symptoms worsen or fail to improve.  Enrika Aguado P Cormick Moss, MD   This visit was completed via video visit through MyChart due to the restrictions of the COVID-19 pandemic. All issues as above were discussed and addressed. Physical exam was done as above through visual confirmation on video through MyChart. If it was felt that  the patient should be evaluated in the office, they were directed there. The patient verbally consented to this visit."} Location of the patient: home Location of the provider: work Those involved with this call:  Provider: Modena Nunnery, MD CMA: Babs Bertin, CMA Time spent on call:  20 minutes with patient face to face via video conference. More than 50% of this time was spent in counseling and coordination of care. 10 minutes total spent in review of patient's record and preparation of their chart. Total time spent on this encounter: 30 minutes.

## 2023-07-28 LAB — PROTIME-INR: INR: 3.6 — AB (ref 0.80–1.20)

## 2023-07-29 LAB — PROTIME-INR: INR: 4.3 — AB (ref 0.80–1.20)

## 2023-07-30 LAB — PROTIME-INR: INR: 4.5 — AB (ref 0.80–1.20)

## 2023-07-31 ENCOUNTER — Telehealth: Payer: Self-pay

## 2023-07-31 ENCOUNTER — Encounter: Payer: 59 | Admitting: Cardiology

## 2023-07-31 NOTE — Telephone Encounter (Signed)
Spoke with Roberto [name] via DPR. Asked if pt would like to do procedure in [place] and asked how pt is feeling after being sick last week. Pt is starting to feel better and would like to look into doing the procedure in [place] pending who is doing it.

## 2023-08-01 ENCOUNTER — Ambulatory Visit: Payer: Self-pay | Admitting: Family Medicine

## 2023-08-01 ENCOUNTER — Ambulatory Visit: Payer: Medicare Other | Attending: Cardiology | Admitting: Cardiology

## 2023-08-01 ENCOUNTER — Telehealth: Payer: Self-pay

## 2023-08-01 ENCOUNTER — Encounter: Payer: Self-pay | Admitting: Cardiology

## 2023-08-01 VITALS — BP 111/82 | HR 114 | Wt 262.0 lb

## 2023-08-01 DIAGNOSIS — Z95810 Presence of automatic (implantable) cardiac defibrillator: Secondary | ICD-10-CM | POA: Diagnosis not present

## 2023-08-01 DIAGNOSIS — I7121 Aneurysm of the ascending aorta, without rupture: Secondary | ICD-10-CM

## 2023-08-01 DIAGNOSIS — I5022 Chronic systolic (congestive) heart failure: Secondary | ICD-10-CM | POA: Diagnosis not present

## 2023-08-01 DIAGNOSIS — I4819 Other persistent atrial fibrillation: Secondary | ICD-10-CM

## 2023-08-01 DIAGNOSIS — Z952 Presence of prosthetic heart valve: Secondary | ICD-10-CM

## 2023-08-01 DIAGNOSIS — I2510 Atherosclerotic heart disease of native coronary artery without angina pectoris: Secondary | ICD-10-CM

## 2023-08-01 DIAGNOSIS — I472 Ventricular tachycardia, unspecified: Secondary | ICD-10-CM

## 2023-08-01 DIAGNOSIS — Z79899 Other long term (current) drug therapy: Secondary | ICD-10-CM

## 2023-08-01 MED ORDER — LOSARTAN POTASSIUM 25 MG PO TABS: 12.5000 mg | ORAL_TABLET | Freq: Every day | ORAL | 3 refills | Status: DC

## 2023-08-01 NOTE — Progress Notes (Signed)
Received 9 days of INRs out of prescribed range via fax. I know he's been seeing cardiology regarding his recent hospitalization- who is managing his INR now? If they are we should get his papers faxed to them. If we are, he needs an appointment to discuss the out of range.

## 2023-08-01 NOTE — Progress Notes (Signed)
ADVANCED HEART FAILURE CLINIC NOTE  Referring Physician: [name], DO  Primary Care: [name], DO Primary Cardiologist:  CC: Systolic heart failure, Stage C/D  HPI: Roberto [name] is a 68 y.o. male with aortic stenosis status post mechanical AVR, large thoracic aortic aneurysm followed by cardiac surgery, chronic systolic heart failure and nonobstructive coronary artery disease presenting today to establish care.    His cardiac history dates back to March 25, 2010 when he underwent mechanical aortic valve replacement; has been on warfarin since that time.  Echocardiogram in 2020 with reduction in LVEF to 40 to 45% and dilation of the ascending aorta measuring 4.6 cm.  In 2022 he underwent left and right heart catheterization which demonstrated 60% stenosis of a large diagonal branch but otherwise only mild disease of the mid LAD and proximal RCA.  In 2020 3 repeat echo with reduction in EF to 35 to 40%.   Presented to [facility] in 12/24.  Initial EKG was consistent with monomorphic ventricular tachycardia that resolved with IV amiodarone with repeat EKG demonstrating atrial fibrillation with PVCs.  LHC with stable nonobstructive CAD; RHC w/ cardiac index severely reduced at 1.9 L/min/m2 with severely elevated filling pressures. He was briefly placed on inotropes and aggressively diuresed.   Interval hx:  - Reports that he can now lay flat without dyspnea, no palpitations, minimal lightheadedness (only occurs with positional changes).  - He is able to walk now without significant dyspnea; he does have issues with balance. He walked from the parking lot to our clinic without difficulty; he was previously unable to do this.  - Compliant with all medications now.   Activity level/exercise tolerance:  NYHA III Orthopnea:  Sleeps on 2 pillows Paroxysmal noctural dyspnea:  no Chest pain/pressure:  no Orthostatic lightheadedness:  mild Palpitations:  no Lower extremity edema:   no Presyncope/syncope:  no Cough:  no  Past Medical History:  Diagnosis Date   Bicuspid aortic valve    a. s/p #27 Carbomedics mechanical valve on 03/25/2010; b. on Coumadin; c. TTE 12/17: EF 40-45%, moderately dilated LV with moderate LVH, AVR well-seated with 14 mmHg gradient, peak AV velocity 2.5 m/s, mild mitral valve thickening with mild MR, mildly dilated RV with mildly reduced contraction   Cellulitis    Chronic kidney disease    Chronic systolic CHF (congestive heart failure) (HCC)    a. R/LHC 03/2010 showed no significant CAD, LVEDP 31 mmHg, mean AoV gradient 34 mmHg at rest and 47 mmHg with dobutamine 20 mcg/kg/min, AVA 1.0 cm^2, RA 31, RV 68/25, PA 68/47, PCWP 38. PA sat 65%. CO 6.2 L/min (Fick) and 5.3 L/min (thermodilution)   Clotting disorder (HCC)    H/O mechanical aortic valve replacement 03/25/2010   a. #27 Carbomedics mechanical valve   Hypercholesterolemia    Renal infarct [facility]) 2017   Multiple right renal infarcts, likely embolic.   Stroke [facility])    TIA (transient ischemic attack) 05/2014    Current Outpatient Medications  Medication Sig Dispense Refill   albuterol (PROVENTIL) (2.5 MG/3ML) 0.083% nebulizer solution USE 1 VIAL IN NEBULIZER EVERY 6 HOURS AS NEEDED FOR WHEEZING FOR SHORTNESS OF BREATH 180 mL 1   allopurinol (ZYLOPRIM) 300 MG tablet Take 1 tablet (300 mg total) by mouth daily. 90 tablet 1   amiodarone (PACERONE) 200 MG tablet Take 2 tablets (400 mg total) by mouth 2 (two) times daily for 180 doses. 180 tablet 1   aspirin 81 MG chewable tablet Chew 81 mg by  mouth daily.     buPROPion (WELLBUTRIN XL) 300 MG 24 hr tablet Take 1 tablet (300 mg total) by mouth daily. 90 tablet 3   clotrimazole (MYCELEX) 10 MG troche Take 1 tablet (10 mg total) by mouth 5 (five) times daily for 7 days. 35 tablet 0   dapagliflozin propanediol (FARXIGA) 10 MG TABS tablet Take 1 tablet (10 mg total) by mouth daily before breakfast. 30 tablet 11   fenofibrate (TRICOR) 48 MG tablet  Take 1 tablet (48 mg total) by mouth daily. 90 tablet 3   metFORMIN (GLUCOPHAGE-XR) 500 MG 24 hr tablet Take 1 tablet (500 mg total) by mouth daily with breakfast. 90 tablet 1   nortriptyline (PAMELOR) 10 MG capsule Take 1 capsule (10 mg total) by mouth at bedtime. 90 capsule 1   Omega-3 Fatty Acids (FISH OIL) 1000 MG CAPS Take 5,000 mg by mouth daily.     rosuvastatin (CRESTOR) 40 MG tablet Take 1 tablet (40 mg total) by mouth daily. 90 tablet 3   Semaglutide, 1 MG/DOSE, 4 MG/3ML SOPN Inject 1 mg as directed once a week. (Patient taking differently: Inject 1 mg as directed once a week. On Mondays) 3 mL 6   Spacer/Aero-Holding [facility] Use as directed Dx: COPD, J44.9 1 each 1   spironolactone (ALDACTONE) 25 MG tablet Take 0.5 tablets (12.5 mg total) by mouth daily. 45 tablet 3   torsemide (DEMADEX) 20 MG tablet Take 1 tablet (20 mg total) by mouth daily.     TRELEGY ELLIPTA 100-62.5-25 MCG/ACT AEPB Inhale 1 puff by mouth once daily 180 each 1   VENTOLIN HFA 108 (90 Base) MCG/ACT inhaler INHALE 2 PUFFS BY MOUTH EVERY 6 HOURS AS NEEDED FOR WHEEZING OR SHORTNESS OF BREATH 54 g 6   warfarin (COUMADIN) 4 MG tablet TAKE 1 TABLET BY MOUTH ONCE DAILY AT  6PM  AS  DIRECTED     warfarin (COUMADIN) 7.5 MG tablet TAKE 4MG  BY MOUTH FOR 2 DAYS, THEN TAKE 7.5MG  ON THE THIRD DAY, THEN REPEAT     No current facility-administered medications for this visit.    No Known Allergies    Social History   Socioeconomic History   Marital status: Single    Spouse name: Not on file   Number of children: 1   Years of education: 4   Highest education level: Associate degree: academic program  Occupational History   Occupation: Disabled    Employer: UNEMPLOYED  Tobacco Use   Smoking status: Former    Current packs/day: 0.00    Average packs/day: 0.5 packs/day for 46.0 years (23.0 ttl pk-yrs)    Types: Cigarettes    Start date: 07/16/2017    Quit date: 05/11/2019    Years since quitting: 4.2   Smokeless  tobacco: Never  Vaping Use   Vaping status: Never Used  Substance and Sexual Activity   Alcohol use: No    Alcohol/week: 0.0 standard drinks of alcohol   Drug use: No   Sexual activity: Not Currently  Other Topics Concern   Not on file  Social History Narrative   Not on file   Social Drivers of Health   Financial Resource Strain: Low Risk  (08/21/2022)   Overall Financial Resource Strain (CARDIA)    Difficulty of Paying Living Expenses: Not very hard  Food Insecurity: No Food Insecurity (06/29/2023)   Hunger Vital Sign    Worried About Running Out of Food in the Last Year: Never true    Ran Out of  Food in the Last Year: Never true  Transportation Needs: No Transportation Needs (06/29/2023)   PRAPARE - Administrator, Civil Service (Medical): No    Lack of Transportation (Non-Medical): No  Physical Activity: Inactive (08/21/2022)   Exercise Vital Sign    Days of Exercise per Week: 0 days    Minutes of Exercise per Session: 0 min  Stress: No Stress Concern Present (08/21/2022)   [employer] of Occupational Health - Occupational Stress Questionnaire    Feeling of Stress : Only a little  Social Connections: Socially Isolated (08/21/2022)   Social Connection and Isolation Panel [NHANES]    Frequency of Communication with Friends and Family: More than three times a week    Frequency of Social Gatherings with Friends and Family: Never    Attends Religious Services: Never    Database administrator or Organizations: No    Attends Banker Meetings: Never    Marital Status: Divorced  Catering manager Violence: Patient Unable To Answer (06/29/2023)   Humiliation, Afraid, Rape, and Kick questionnaire    Fear of Current or Ex-Partner: Patient unable to answer    Emotionally Abused: Patient unable to answer    Physically Abused: Patient unable to answer    Sexually Abused: Patient unable to answer      Family History  Problem Relation Age of Onset    Arthritis Mother    Dementia Mother    Colon cancer Mother    Arthritis Father    Diabetes Father    Stroke Father    Colon cancer Father    Heart attack Brother    Breast cancer Sister    Seizures Sister    Cancer Brother        brain   Heart disease Brother    Heart attack Brother     PHYSICAL EXAM: Vitals:   08/01/23 0954  BP: 111/82  Pulse: (!) 114  SpO2: 100%   GENERAL: Well nourished, well developed, and in no apparent distress at rest.  HEENT: There is no scleral icterus.  The mucous membranes are pink and moist.   CHEST: There are no chest wall deformities. There is no chest wall tenderness. Respirations are unlabored.  Lungs- CTA B/L CARDIAC:  JVP: 6 cm          Normal rate with regular rhythm. no murmur.  Pulses are 2+ and symmetrical in upper and lower extremities. no edema.  ABDOMEN: Soft, non-tender, non-distended. There are normal bowel sounds.  EXTREMITIES: Warm and well perfused.  NEUROLOGIC: Patient is oriented x3 with no obvious focal neurologic deficits.  PSYCH: Patients affect is appropriate SKIN: Warm and dry; no lesions or wounds.    DATA REVIEW  ECG: 07/23/22: NSR w/ LBBB  As per my personal interpretation  ECHO: 06/20/23: LVEF 20-25%, moderately reduced RV function.   CATH: 06/20/23: Mild to moderate, non-obstructive coronary artery disease with 60% D1 stenosis and 20% mid LAD and proximal RCA lesions.  No significant change noted from prior angiogram in 2020. Moderately elevated left heart filling pressure (PCWP 30 mmHg with prominent v-waves). Severely elevated right heart filling pressure (RA 25, RVEDP 18 mmHg). Moderate pulmonary hypertension (PA 50/28, mean 35 mmHg). Moderately reduced Fick cardiac output/index (CO 4.4 L/min, CI 1.9 L/min/m^2).  CMR:  06/25/23:  1. Severe LVE with global hypokinesis LVEF 20%  2. Delayed gadolinium images with mid myocardial basal septal uptake not consistent with CAD  3.  Mild RVE with hypokinesis  RVEF  41%  5. Severe ascending thoracic aneurysm measuring 5.5 cm Patient does not appear to have had Bentall procedure during his AVR Note non contrast chest CT done 03/17/23 measured this at 5.9 cm He has been seen by Dr Laneta Simmers CVTS on 02/14/23 and not thought to be a redo candidate due to co morbidities 7. Bileaflet 27 mm AVR with carbomedics valve mild closing volume AR and normal leaflet motion  Sleep study: 2020 1. Moderate Obstructive Sleep Apnea (G47.33) with AHI 19.6/hr. 2. Mild Oxygen desaturations as low as 83%. Time spent with O2 sats < 88% was 0.3 min. 3. Severe Snoring. 4. Average heart rate 62bpm. 5. Normal sleep onset latency at 30 min. 6. Prolonged REM sleep onset latency at 302 min. 7. Fragmented sleep with 46 awakenings during sleep.  ASSESSMENT & PLAN:  Heart failure with reduced EF Etiology of ZO:XWRUEAVW cardiomyopathy with likely nonischemic component NYHA class / AHA Stage:NYHA IIB-III Volume status & Diuretics: Euvolemic, torsemide 20mg  daily Vasodilators:start losartan 12.5mg  at bedtime.  Beta-Blocker:start toprol 12.5mg  at pharmD follow up; he has low output heart failure, will need to start carefully.  UJW:[id number] 12.5mg  daily Cardiometabolic:farxiga 10mg  Devices therapies & Valvulopathies:ICD, implanted 06/25/23 by Dr. Ladona Ridgel Advanced therapies:not a candidate at this time.   Persistent Atrial fibrillation  - Amiodarone 400mg  BID  - Will start toprol 12.5mg  XL at pharmD follow up   Ventricular tachycardia - Currently taking 400mg  BID for atrial fibrillation  - Will wean down after cardioversion - Followed by Dr. Ladona Ridgel - Reviewed device interrogation from 06/28/23, no arrhythmias.   CAD - Stable non-obstructive disease - continue ASA 81mg  daily and crestor.  - Reviewed Dr. Serita Kyle notes from 07/12/23  Thoracic aortic aneurysm - Large ascending aortic aneurysm; followed by Dr. Laneta Simmers.  - This would complicate any attempts at advanced  therapies.   OSA - Sleep study reviewed from 2020, noted above and personally interpreted. He has an AHI of close to 20/hr; abnormal. Will benefit from CPAP. Refer back to Dr. Mayford Knife  Obesity - discussed and counseled on importance of weight loss  I spent 50 minutes caring for this patient today including face to face time, ordering and reviewing labs, reviewing records noted above, counseling patient on med compliance, weight loss, seeing the patient, documenting in the record, and arranging follow ups.    Siearra Amberg Advanced Heart Failure Mechanical Circulatory Support

## 2023-08-01 NOTE — Telephone Encounter (Addendum)
Device alert for 34 VT/VF events since 1/17 2 events falling into the the VT-1 zone with ATP delivered 2-3x, slowing rate but not terminating arrhythmia Events occurred 1/27 @ 17:00 adn 1/28 @ 11:33 Presenting irregular VS, known AF, Warfarin per PA report Route to triage high alert per protocol LA, CVRS  Spoke with patient and friend Selena Batten).  Patient is feeling fine but friend says he is "nodding" off a lot today. He doesn't want to go to ER but has ER precautions if becomes symptomatic.  Appt made with A. Tillery [name] tomorrow 08/02/23 at 8:20am. Patient/friend verbalize understanding.    [place] driving restrictions (no driving due to VT/ATP) given X 6months to patient/friend.

## 2023-08-01 NOTE — Patient Instructions (Signed)
Medication Changes:  BEGIN Losartan 12.5 mg (1/2 tablet) daily at bedtime  Lab Work:  Return for lab work at the [facility] in 1 week. Your results will be available in MyChart, we will contact you for abnormal readings.   Testing/Procedures:  Your physician has requested that you have an echocardiogram. Echocardiography is a painless test that uses sound waves to create images of your heart. It provides your doctor with information about the size and shape of your heart and how well your heart's chambers and valves are working. This procedure takes approximately one hour. There are no restrictions for this procedure. Please do NOT wear cologne, perfume, aftershave, or lotions (deodorant is allowed). Please arrive 15 minutes prior to your appointment time.  Please note: We ask at that you not bring children with you during ultrasound (echo/ vascular) testing. Due to room size and safety concerns, children are not allowed in the ultrasound rooms during exams. Our front office staff cannot provide observation of children in our lobby area while testing is being conducted. An adult accompanying a patient to their appointment will only be allowed in the ultrasound room at the discretion of the ultrasound technician under special circumstances. We apologize for any inconvenience.   Echo scheduled for September 27, 2023 at 9 a.m. Please arrive at 8:45 a.m. and check in at the registration desk in the [facility].    Special Instructions // Education:  Do the following things EVERYDAY: Weigh yourself in the morning before breakfast. Write it down and keep it in a log. Take your medicines as prescribed Eat low salt foods--Limit salt (sodium) to 2000 mg per day.  Stay as active as you can everyday Limit all fluids for the day to less than 2 liters   Follow-Up in: 3 weeks with pharmacy.  2 months. 09/27/23 at 10:15 a.m. with Dr. Gasper Lloyd    If you have any questions or  concerns before your next appointment please send [place] a message through [facility] or call our office at [phone] Monday-Friday 8 am-5 pm.   If you have an urgent need after hours on the weekend please call your Primary Cardiologist or the Advanced Heart Failure Clinic in [place] at [phone].   At the Advanced Heart Failure Clinic, you and your health needs are our priority. We have a designated team specialized in the treatment of Heart Failure. This Care Team includes your primary Heart Failure Specialized Cardiologist (physician), Advanced Practice Providers (APPs- Physician Assistants and Nurse Practitioners), and Pharmacist who all work together to provide you with the care you need, when you need it.   You may see any of the following providers on your designated Care Team at your next follow up:  Dr. Arvilla Meres Dr. Marca Ancona Dr. Dorthula Nettles Dr. Theresia Bough Tonye Becket, NP Robbie Lis, [place] [address] [place], [place] Brynda Peon, NP [place] Lee, NP Clarisa Kindred, NP Enos Fling, PharmD

## 2023-08-02 ENCOUNTER — Ambulatory Visit: Payer: 59 | Attending: Student | Admitting: Student

## 2023-08-02 ENCOUNTER — Encounter: Payer: Self-pay | Admitting: Student

## 2023-08-02 ENCOUNTER — Encounter: Payer: Self-pay | Admitting: *Deleted

## 2023-08-02 ENCOUNTER — Encounter: Payer: Self-pay | Admitting: Pediatrics

## 2023-08-02 ENCOUNTER — Telehealth: Payer: Self-pay | Admitting: *Deleted

## 2023-08-02 ENCOUNTER — Other Ambulatory Visit
Admission: RE | Admit: 2023-08-02 | Discharge: 2023-08-02 | Disposition: A | Discharge status: Home / Self Care | Payer: 59 | Source: Ambulatory Visit | Attending: Student | Admitting: Student

## 2023-08-02 VITALS — BP 118/78 | HR 103 | Ht 66.0 in | Wt 260.4 lb

## 2023-08-02 DIAGNOSIS — I7121 Aneurysm of the ascending aorta, without rupture: Secondary | ICD-10-CM | POA: Diagnosis not present

## 2023-08-02 DIAGNOSIS — I472 Ventricular tachycardia, unspecified: Secondary | ICD-10-CM | POA: Diagnosis not present

## 2023-08-02 DIAGNOSIS — I5022 Chronic systolic (congestive) heart failure: Secondary | ICD-10-CM | POA: Insufficient documentation

## 2023-08-02 DIAGNOSIS — Z95810 Presence of automatic (implantable) cardiac defibrillator: Secondary | ICD-10-CM | POA: Diagnosis not present

## 2023-08-02 DIAGNOSIS — I2510 Atherosclerotic heart disease of native coronary artery without angina pectoris: Secondary | ICD-10-CM

## 2023-08-02 LAB — CUP PACEART INCLINIC DEVICE CHECK
Battery Remaining Longevity: 118 mo
Brady Statistic RV Percent Paced: 0.01 %
Date Time Interrogation Session: 20250130122402
HighPow Impedance: 70.875
Implantable Lead Connection Status: 753985
Implantable Lead Implant Date: 20241223
Implantable Lead Location: 753860
Implantable Lead Model: 7122
Implantable Pulse Generator Implant Date: 20241223
Lead Channel Impedance Value: 500 Ohm
Lead Channel Sensing Intrinsic Amplitude: 12 mV
Lead Channel Setting Pacing Amplitude: 3.5 V
Lead Channel Setting Pacing Pulse Width: 0.5 ms
Lead Channel Setting Sensing Sensitivity: 0.5 mV
Pulse Gen Serial Number: 111074223

## 2023-08-02 LAB — CBC
HCT: 44.2 % (ref 39.0–52.0)
Hemoglobin: 14.2 g/dL (ref 13.0–17.0)
MCH: 28.5 pg (ref 26.0–34.0)
MCHC: 32.1 g/dL (ref 30.0–36.0)
MCV: 88.6 fL (ref 80.0–100.0)
Platelets: 325 10*3/uL (ref 150–400)
RBC: 4.99 MIL/uL (ref 4.22–5.81)
RDW: 16.3 % — ABNORMAL HIGH (ref 11.5–15.5)
WBC: 9.2 10*3/uL (ref 4.0–10.5)
nRBC: 0 % (ref 0.0–0.2)

## 2023-08-02 LAB — BASIC METABOLIC PANEL
Anion gap: 11 (ref 5–15)
BUN: 30 mg/dL — ABNORMAL HIGH (ref 8–23)
CO2: 27 mmol/L (ref 22–32)
Calcium: 9.5 mg/dL (ref 8.9–10.3)
Chloride: 105 mmol/L (ref 98–111)
Creatinine, Ser: 1.81 mg/dL — ABNORMAL HIGH (ref 0.61–1.24)
GFR, Estimated: 40 mL/min — ABNORMAL LOW (ref >60)
Glucose, Bld: 100 mg/dL — ABNORMAL HIGH (ref 70–99)
Potassium: 4.1 mmol/L (ref 3.5–5.1)
Sodium: 143 mmol/L (ref 135–145)

## 2023-08-02 NOTE — Progress Notes (Signed)
Electrophysiology Office Note:   ID:  Roberto [name], DOB 1955/12/20, MRN [id number]  Primary Cardiologist: Yvonne Kendall, MD Electrophysiologist: Lewayne Bunting, MD      History of Present Illness:   Roberto [name] is a 68 y.o. male with h/o persistent AF with RVR, VT, chronic systolic CHF, OSA,  and mechanical aortic valve seen today for acute visit due to VT on his device.    Patient reports doing OK. Has had some mild lightheadedness and dizziness. Wife is getting confused with so many different specialists taking care of him.  Had misunderstood instructions to "make sure his INR never gets > 5.0" to imply that that was his new goal upper limit, and has been letting it ride high.   Encouraged PCP in office visit, which is whom they wish to continue monitoring. INR has remained > 2.0 so is now within timeline to schedule cardioversion, which has been discussed but not yet scheduled, due to same above confusion. He is feeling OK today. Remains SOB with more than mild exertion, with some fatigue. No edema or syncope.  "Nods off" when watching TV.   Review of systems complete and found to be negative unless listed in HPI.   EP Information / Studies Reviewed:    EKG is ordered today. Personal review as below.  EKG Interpretation Date/Time:  Thursday August 02 2023 08:15:09 EST Ventricular Rate:  103 PR Interval:    QRS Duration:  152 QT Interval:  414 QTC Calculation: 542 R Axis:   -9  Text Interpretation: Atrial fibrillation with rapid ventricular response Premature ventricular complexes Left bundle branch block When compared with ECG of 20-Jul-2023 08:03, Premature ventricular complexes are now Present Confirmed by Maxine Glenn [id number]) on 08/02/2023 8:20:28 AM    ICD Interrogation-  reviewed in detail today,  See PACEART report.  Arrhythmia/Device History Single Chamber Abbott ICD 06/2023 for VT and CHF   cMRI 06/25/2023 LVEF 20%, severe RAE, and moderate LAE, Severe  ascending thoracic aneurysm measuring 5.5 cm, mod TR, mild MR  R/LHC 06/20/2023 Mild to moderate, non-obstructive coronary artery disease with 60% D1 stenosis and 20% mid LAD and proximal RCA lesions.  No significant change noted from prior angiogram in 2020. Moderately elevated left heart filling pressure (PCWP 30 mmHg with prominent v-waves). Severely elevated right heart filling pressure (RA 25, RVEDP 18 mmHg). Moderate pulmonary hypertension (PA 50/28, mean 35 mmHg). Moderately reduced Fick cardiac output/index (CO 4.4 L/min, CI 1.9 L/min/m^2).  Physical Exam:   VS:  BP 118/78 (BP Location: Left Arm, Patient Position: Sitting)   Pulse (!) 103   Ht 5\' 6"  (1.676 m)   Wt 260 lb 6.4 oz (118.1 kg)   SpO2 95%   BMI 42.03 kg/m    Wt Readings from Last 3 Encounters:  08/02/23 260 lb 6.4 oz (118.1 kg)  08/01/23 262 lb (118.8 kg)  07/27/23 256 lb (116.1 kg)     GEN: No acute distress  NECK: No JVD; No carotid bruits CARDIAC: Regular rate and rhythm, no murmurs, rubs, gallops RESPIRATORY:  Clear to auscultation without rales, wheezing or rhonchi  ABDOMEN: Soft, non-tender, non-distended EXTREMITIES:  No edema; No deformity   ASSESSMENT AND PLAN:    Ventricular arrhythmia  s/p Abbott dual chamber ICD  Chronic systolic CHF euvolemic today Stable on an appropriate medical regimen Normal ICD function See Pace Art report No changes today Continue losartan 12.5 mg at bedtime (started 08/01/2023) Continue amiodarone 400 mg BID until [facility], then would decrease to 200 mg  BID for a while longer.  He had episodes of NSVT and more sustained episodes with ATP on 1/28. None since.  Labs today.  Seen by HF clinic 1/29 and wish to hold off on BB for now.   Would start low dose toprol if he has further VT/NSVT.   Uncontrolled AF Has received ATP several times, not completely clear if VT, AF RVR, or dual tachycardia given single lead device.  BB has been referred due to low output during recent  admission.  Discussions of cardioversion but there was some confusion around scheduling.  His INR has been > 2.0 for at least 3-4 weeks. Will schedule cardioversion.   Elevated INR  Last 9 results have all been close to 4.0, but goal is reportedly 2.5-3.5. Caregiver had misunderstood when she was previously told not to let it get > 5.0 that his new goal was 2.5-5.0. Clarification given Pt and family wish for PCP to continue managing  Disposition:   Follow up with EP APP post cardioversion.    Signed, Graciella Freer, [name]

## 2023-08-02 NOTE — Assessment & Plan Note (Signed)
Worsening of pre-existing tremors, difficulty eating and signing name. Possible exacerbation due to Amiodarone. -Communicate with cardiology regarding tremors and possible medication adjustment.

## 2023-08-02 NOTE — Assessment & Plan Note (Addendum)
Reduced smoking from a pack to 5-6 cigarettes per day. History of adverse reactions to nicotine patches and Chantix. Currently on Bupropion twice daily. -Switch Bupropion to extended release formulation. -Time spent on counseling: 3 minutes.

## 2023-08-02 NOTE — Patient Instructions (Addendum)
Medication Instructions:  Your physician recommends that you continue on your current medications as directed. Please refer to the Current Medication list given to you today.  *If you need a refill on your cardiac medications before your next appointment, please call your pharmacy*  Lab Work: BMET, CBC-TODAY If you have labs (blood work) drawn today and your tests are completely normal, you will receive your results only by: MyChart Message (if you have MyChart) OR A paper copy in the mail If you have any lab test that is abnormal or we need to change your treatment, we will call you to review the results.   Testing/Procedures: Your physician has recommended that you have a Cardioversion (DCCV) on 08/10/23. Electrical Cardioversion uses a jolt of electricity to your heart either through paddles or wired patches attached to your chest. This is a controlled, usually prescheduled, procedure. Defibrillation is done under light anesthesia in the hospital, and you usually go home the day of the procedure. This is done to get your heart back into a normal rhythm. You are not awake for the procedure. Please see the instruction sheet given to you today.    Follow-Up: At [facility], you and your health needs are our priority.  As part of our continuing mission to provide you with exceptional heart care, we have created designated Provider Care Teams.  These Care Teams include your primary Cardiologist (physician) and Advanced Practice Providers (APPs -  Physician Assistants and Nurse Practitioners) who all work together to provide you with the care you need, when you need it.  Your next appointment:   1-2 week(s) after cardioversion  Provider:   Sherie Don, NP Francis Dowse, [name]

## 2023-08-02 NOTE — Assessment & Plan Note (Signed)
Persistent symptoms of fatigue, increased stress due to upcoming cardioversion. I am concerned about how high his heart rate is. Has upcoming visit with cardiology. On amiodarone, losartan, torsemide. Pt given strict return precautions and anticipatory guidance for ED. May have URI and or have dehydration from reduced eating from suspected thrush.  -Continue current management and follow up with cardiology as scheduled.

## 2023-08-02 NOTE — Telephone Encounter (Signed)
Called and informed Selena Batten (okay per [facility]) that patient only needs to hold Metformin on the morning of his cardioversion. All other med holds that were discussed at todays office visit remain unchanged. Selena Batten states her understanding and appreciation for the call.

## 2023-08-08 NOTE — Telephone Encounter (Signed)
 Roberto [name], can you confirm that he is taking 400 mg daily of amiodarone . Ask him to come in and get an amiodarone  level.

## 2023-08-09 ENCOUNTER — Telehealth: Payer: Self-pay | Admitting: Internal Medicine

## 2023-08-09 NOTE — Telephone Encounter (Signed)
 Spoke with [name] to discuss date to reschedule DCCV. DCCV scheduled for 08/24/23, reiterated procedure instructions, med holds and arrival time with [name] to which she verbalized her understanding. Also rescheduled post DCCV f/u appointment. She will call us  back should she have any further questions or concerns.

## 2023-08-09 NOTE — Telephone Encounter (Signed)
 Pt's friend [name] calling to reschedule cardioversion scheduled for tomorrow 2/7. Requesting cb

## 2023-08-10 ENCOUNTER — Encounter (HOSPITAL_COMMUNITY): Admission: RE | Payer: 59 | Source: Home / Self Care

## 2023-08-10 ENCOUNTER — Other Ambulatory Visit: Payer: Self-pay

## 2023-08-10 ENCOUNTER — Ambulatory Visit (HOSPITAL_COMMUNITY): Admission: RE | Admit: 2023-08-10 | Payer: 59 | Source: Home / Self Care | Admitting: Cardiovascular Disease

## 2023-08-10 DIAGNOSIS — Z79899 Other long term (current) drug therapy: Secondary | ICD-10-CM

## 2023-08-10 DIAGNOSIS — I472 Ventricular tachycardia, unspecified: Secondary | ICD-10-CM

## 2023-08-10 DIAGNOSIS — I4819 Other persistent atrial fibrillation: Secondary | ICD-10-CM

## 2023-08-10 SURGERY — CARDIOVERSION (CATH LAB)
Anesthesia: Monitor Anesthesia Care

## 2023-08-10 NOTE — Telephone Encounter (Signed)
(  Caregiver [name]) states that patient is taking his amiodarone  400mg  every day. Orders put in for amio level - they are planning on going tomorrow to get lab at a local [facility].

## 2023-08-12 NOTE — Pre-Procedure Instructions (Signed)
 Attempted to call patient regarding procedure on 2/21 with anesthesia.  Left voicemail that anesthesia requires Ozempic  to be held 7 days prior to procedure.  Last day you can take it is 2/13 until after procedure.

## 2023-08-13 ENCOUNTER — Other Ambulatory Visit
Admission: RE | Admit: 2023-08-13 | Discharge: 2023-08-13 | Disposition: A | Discharge status: Home / Self Care | Payer: 59 | Attending: Internal Medicine | Admitting: Internal Medicine

## 2023-08-13 DIAGNOSIS — I472 Ventricular tachycardia, unspecified: Secondary | ICD-10-CM | POA: Diagnosis present

## 2023-08-13 DIAGNOSIS — I4819 Other persistent atrial fibrillation: Secondary | ICD-10-CM | POA: Insufficient documentation

## 2023-08-13 DIAGNOSIS — Z79899 Other long term (current) drug therapy: Secondary | ICD-10-CM | POA: Insufficient documentation

## 2023-08-15 ENCOUNTER — Ambulatory Visit: Payer: 59 | Admitting: Pediatrics

## 2023-08-17 LAB — MISC LABCORP TEST (SEND OUT): Labcorp test code: 702891

## 2023-08-20 ENCOUNTER — Other Ambulatory Visit (HOSPITAL_COMMUNITY): Payer: Self-pay

## 2023-08-20 NOTE — Progress Notes (Deleted)
 Advanced Heart Failure Clinic Note  PCP: [name], DO PCP-Cardiologist: Yvonne Kendall, MD HF-Cardiologist: Dorthula Nettles, DO  HPI:  Roberto [name] is a 68 y.o. male with aortic stenosis status post mechanical AVR, large thoracic aortic aneurysm followed by cardiac surgery, chronic systolic heart failure and nonobstructive coronary artery disease presenting today for medication titration.    His cardiac history dates back to March 25, 2010 when he underwent mechanical aortic valve replacement; has been on warfarin since that time.  Echocardiogram in 2020 with reduction in LVEF to 40 to 45% and dilation of the ascending aorta measuring 4.6 cm.  In 2022 he underwent left and right heart catheterization which demonstrated 60% stenosis of a large diagonal branch but otherwise only mild disease of the mid LAD and proximal RCA.  In 2020 3 repeat echo with reduction in EF to 35 to 40%.   Presented to [facility] in 06/20[facility].  Initial EKG was consistent with monomorphic ventricular tachycardia that resolved with IV amiodarone with repeat EKG demonstrating atrial fibrillation with PVCs.  LHC with stable nonobstructive CAD; RHC w/ cardiac index severely reduced at 1.9 L/min/m2 with severely elevated filling pressures. He was briefly placed on inotropes and aggressively diuresed.    Seen by Dr. Gasper Lloyd on 08/01/23 where symptoms were much improved. BP was 111/82 and losartan 12.5 mg daily was started with plans to initiate metoprolol succinate at next follow-up.  Seen by EP on 08/02/23. Device interrogation revealed episodes of NSVT and more sustained episodes with ATP on 07/31/23. Recommended starting metoprolol if more events occurred. DCCV scheduled for 08/24/23  Today Roberto [name] returns to Heart Failure Clinic for pharmacist medication titration. Reports feeling ***. {Reports/Denies:210917258} {ACTIONS;DENIES/REPORTS:21021675::"Denies"} being able to complete all activities of daily living  (ADLs). Is *** active throughout the day. Weight at home is *** pounds. Takes {CHL AMB AHFC Medications:210917260} ***. Appetite ***. {Does Follow/Does Not Follow:210917261} a low sodium diet.  Current Heart Failure Medications: Loop diuretic: torsemide 20 mg BID???? Beta-Blocker: none ACEI/ARB/ARNI: losartan 12.5 mg daily MRA: spironolactone 12.5 mg daily SGLT2i: Farxiga 10 mg daily Other: Semaglutide 1 mg weekly  Has the patient been experiencing any side effects to the medications prescribed? {yes/no:20286}  Does the patient have any problems obtaining medications due to transportation or finances? {yes/no:20286}  Understanding of regimen: {CHL AMB AHFC Excellent/Good/Fair/Poor:210917262}  Understanding of indications: {CHL AMB AHFC Excellent/Good/Fair/Poor:210917262}  Potential of adherence: {CHL AMB AHFC Excellent/Good/Fair/Poor:210917262}  Patient understands to avoid NSAIDs.  Patient understands to avoid decongestants.  Pertinent Lab Values: Creatinine  Date Value Ref Range Status  05/25/2014 0.85 0.60 - 1.30 mg/dL Final   Creatinine, Ser  Date Value Ref Range Status  08/02/2023 1.81 (H) 0.61 - 1.24 mg/dL Final   BUN  Date Value Ref Range Status  08/02/2023 30 (H) 8 - 23 mg/dL Final  13/24/4010 20 8 - 27 mg/dL Final  27/25/3664 15 7 - 18 mg/dL Final   Potassium  Date Value Ref Range Status  08/02/2023 4.1 3.5 - 5.1 mmol/L Final  05/25/2014 3.6 3.5 - 5.1 mmol/L Final   Sodium  Date Value Ref Range Status  08/02/2023 143 135 - 145 mmol/L Final  07/20/2023 143 134 - 144 mmol/L Final  05/25/2014 140 136 - 145 mmol/L Final   B Natriuretic Peptide  Date Value Ref Range Status  06/17/2023 224.7 (H) 0.0 - 100.0 pg/mL Final    Comment:    Performed at [facility], [address] [place], [place] [address]   Magnesium  Date Value Ref Range Status  07/03/2023 2.1 1.6 - 2.3 mg/dL Final   TSH  Date Value Ref Range Status  06/18/2023 1.817 0.350 -  4.500 uIU/mL Final    Comment:    Performed by a 3rd Generation assay with a functional sensitivity of <=0.01 uIU/mL. Performed at [facility], [address] [place], [place] [address]   05/16/2023 1.850 0.450 - 4.500 uIU/mL Final    Vital Signs: There were no vitals filed for this visit.  ECG: 07/23/22: NSR w/ LBBB   ECHO: 06/20/23: LVEF 20-25%, moderately reduced RV function.    CATH: 06/20/23: Mild to moderate, non-obstructive coronary artery disease with 60% D1 stenosis and 20% mid LAD and proximal RCA lesions.  No significant change noted from prior angiogram in 2020. Moderately elevated left heart filling pressure (PCWP 30 mmHg with prominent v-waves). Severely elevated right heart filling pressure (RA 25, RVEDP 18 mmHg). Moderate pulmonary hypertension (PA 50/28, mean 35 mmHg). Moderately reduced Fick cardiac output/index (CO 4.4 L/min, CI 1.9 L/min/m^2).   CMR:  06/25/23:  1. Severe LVE with global hypokinesis LVEF 20%  2. Delayed gadolinium images with mid myocardial basal septal uptake not consistent with CAD  3.  Mild RVE with hypokinesis RVEF 41%  5. Severe ascending thoracic aneurysm measuring 5.5 cm Patient does not appear to have had Bentall procedure during his AVR Note non contrast chest CT done 03/17/23 measured this at 5.9 cm He has been seen by Dr Laneta Simmers CVTS on 02/14/23 and not thought to be a redo candidate due to co morbidities 7. Bileaflet 27 mm AVR with carbomedics valve mild closing volume AR and normal leaflet motion   Sleep study: 2020 1. Moderate Obstructive Sleep Apnea (G47.33) with AHI 19.6/hr. 2. Mild Oxygen desaturations as low as 83%. Time spent with O2 sats < 88% was 0.3 min. 3. Severe Snoring. 4. Average heart rate 62bpm. 5. Normal sleep onset latency at 30 min. 6. Prolonged REM sleep onset latency at 302 min. 7. Fragmented sleep with 46 awakenings during sleep.   Assessment/Plan:   Heart failure with reduced EF Etiology  of AO:ZHYQMVHQ cardiomyopathy with likely nonischemic component NYHA class / AHA Stage:NYHA IIB-III Volume status & Diuretics: Euvolemic, torsemide 20mg  daily Vasodilators:start losartan 12.5mg  at bedtime.  Beta-Blocker:start toprol 12.5mg  at pharmD follow up; he has low output heart failure, will need to start carefully.  ION:[id number] 12.5mg  daily Cardiometabolic:farxiga 10mg  Devices therapies & Valvulopathies:ICD, implanted 06/25/23 by Dr. Ladona Ridgel Advanced therapies:not a candidate at this time.    Persistent Atrial fibrillation  - Amiodarone 400mg  BID  - Will start toprol 12.5mg  XL at pharmD follow up    Ventricular tachycardia - Currently taking 400mg  BID for atrial fibrillation  - Will wean down after cardioversion - Followed by Dr. Ladona Ridgel - Reviewed device interrogation from 06/28/23, no arrhythmias.    CAD - Stable non-obstructive disease - continue ASA 81mg  daily and crestor.  - Reviewed Dr. Serita Kyle notes from 07/12/23   Thoracic aortic aneurysm - Large ascending aortic aneurysm; followed by Dr. Laneta Simmers.  - This would complicate any attempts at advanced therapies.    OSA - Sleep study reviewed from 2020, noted above and personally interpreted. He has an AHI of close to 20/hr; abnormal. Will benefit from CPAP. Refer back to Dr. Mayford Knife   Obesity - discussed and counseled on importance of weight loss     Follow up: ***  ***

## 2023-08-21 ENCOUNTER — Other Ambulatory Visit: Payer: 59

## 2023-08-21 ENCOUNTER — Ambulatory Visit: Payer: Self-pay | Admitting: *Deleted

## 2023-08-21 ENCOUNTER — Other Ambulatory Visit: Payer: Self-pay | Admitting: Family Medicine

## 2023-08-21 ENCOUNTER — Ambulatory Visit (INDEPENDENT_AMBULATORY_CARE_PROVIDER_SITE_OTHER): Payer: 59

## 2023-08-21 ENCOUNTER — Ambulatory Visit
Admission: RE | Admit: 2023-08-21 | Discharge: 2023-08-21 | Disposition: A | Discharge status: Home / Self Care | Payer: 59 | Source: Ambulatory Visit | Attending: Emergency Medicine | Admitting: Emergency Medicine

## 2023-08-21 ENCOUNTER — Telehealth: Payer: Self-pay

## 2023-08-21 ENCOUNTER — Other Ambulatory Visit (HOSPITAL_COMMUNITY): Payer: Self-pay

## 2023-08-21 VITALS — BP 138/90 | HR 68 | Temp 98.3°F | Resp 16

## 2023-08-21 DIAGNOSIS — J441 Chronic obstructive pulmonary disease with (acute) exacerbation: Secondary | ICD-10-CM | POA: Diagnosis not present

## 2023-08-21 DIAGNOSIS — R051 Acute cough: Secondary | ICD-10-CM | POA: Insufficient documentation

## 2023-08-21 DIAGNOSIS — N183 Chronic kidney disease, stage 3 unspecified: Secondary | ICD-10-CM | POA: Diagnosis present

## 2023-08-21 DIAGNOSIS — J069 Acute upper respiratory infection, unspecified: Secondary | ICD-10-CM | POA: Diagnosis not present

## 2023-08-21 HISTORY — DX: Pure hypercholesterolemia, unspecified: E78.00

## 2023-08-21 HISTORY — DX: Unspecified hearing loss, unspecified ear: H91.90

## 2023-08-21 HISTORY — DX: Chronic obstructive pulmonary disease, unspecified: J44.9

## 2023-08-21 HISTORY — DX: Heart failure, unspecified: I50.9

## 2023-08-21 HISTORY — DX: Chronic kidney disease, stage 3 unspecified: N18.30

## 2023-08-21 HISTORY — DX: Aneurysm of unspecified site: I72.9

## 2023-08-21 HISTORY — DX: Essential (primary) hypertension: I10

## 2023-08-21 MED ORDER — PREDNISONE 20 MG PO TABS: 40.0000 mg | ORAL_TABLET | Freq: Every day | ORAL | 0 refills | Status: AC

## 2023-08-21 MED ORDER — ALBUTEROL SULFATE (2.5 MG/3ML) 0.083% IN NEBU: 2.5000 mg | INHALATION_SOLUTION | RESPIRATORY_TRACT | 0 refills | Status: DC | PRN

## 2023-08-21 MED ORDER — IPRATROPIUM-ALBUTEROL 0.5-2.5 (3) MG/3ML IN SOLN
3.0000 mL | Freq: Once | RESPIRATORY_TRACT | Status: AC
  Administered 2023-08-21: 3 mL via RESPIRATORY_TRACT

## 2023-08-21 MED ORDER — AMOXICILLIN-POT CLAVULANATE 875-125 MG PO TABS: 1.0000 | ORAL_TABLET | Freq: Two times a day (BID) | ORAL | 0 refills | Status: DC

## 2023-08-21 MED ORDER — AEROCHAMBER MV MISC: 1 refills | Status: DC

## 2023-08-21 NOTE — Telephone Encounter (Signed)
Roberto [name] (DPR) calling to report Chief Complaint: cough, wheezing Symptoms: SOB with exertion - COPD exacerbated  Frequency: 3 days ago Pertinent Negatives: Patient denies fever Disposition: [] ED /[x] Urgent Care (no appt availability in office) / [] Appointment(In office/virtual)/ []  [facility]/ [] Home Care/ [] Refused Recommended Disposition /[] [place] [facility]/ []  Follow-up with PCP Additional Notes: Patient has been scheduled at [facility]  Reason for Disposition . [1] Longstanding difficulty breathing (e.g., CHF, COPD, emphysema) AND [2] WORSE than normal  Answer Assessment - Initial Assessment Questions 1. RESPIRATORY STATUS: "Describe your breathing?" (e.g., wheezing, shortness of breath, unable to speak, severe coughing)      Breathing- short winded with exertion, cough 2. ONSET: "When did this breathing problem begin?"      Nasal congestion 2 weeks ago- improved, 3 days ago0 back ache, stomach ache 3. PATTERN "Does the difficult breathing come and go, or has it been constant since it started?"      constant 4. SEVERITY: "How bad is your breathing?" (e.g., mild, moderate, severe)    - MILD: No SOB at rest, mild SOB with walking, speaks normally in sentences, can lie down, no retractions, pulse < 100.    - MODERATE: SOB at rest, SOB with minimal exertion and prefers to sit, cannot lie down flat, speaks in phrases, mild retractions, audible wheezing, pulse 100-120.    - SEVERE: Very SOB at rest, speaks in single words, struggling to breathe, sitting hunched forward, retractions, pulse > 120      Cough, wheezing- with deep breath, albuterol used 5. RECURRENT SYMPTOM: "Have you had difficulty breathing before?" If Yes, ask: "When was the last time?" and "What happened that time?"      Yes- hospitailized 6. CARDIAC HISTORY: "Do you have any history of heart disease?" (e.g., heart attack, angina, bypass surgery, angioplasty)      CHF 7. LUNG HISTORY: "Do you have any  history of lung disease?"  (e.g., pulmonary embolus, asthma, emphysema)     COPD 8. CAUSE: "What do you think is causing the breathing problem?"      Chest congestion 9. OTHER SYMPTOMS: "Do you have any other symptoms? (e.g., dizziness, runny nose, cough, chest pain, fever)     Cough, chest congestion, falling- restless 10. O2 SATURATION MONITOR:  "Do you use an oxygen saturation monitor (pulse oximeter) at home?" If Yes, ask: "What is your reading (oxygen level) today?" "What is your usual oxygen saturation reading?" (e.g., 95%)       91% baseline, 98/64 BP, P 100-  from memory  Protocols used: Breathing Difficulty-A-AH

## 2023-08-21 NOTE — Telephone Encounter (Signed)
Patient Advocate Encounter  Test billing returned the following results:  Entresto $0 for 90 days Farxiga $0 for 90 days Ozempic $0 for 90 days Wegovy not covered   Burnell Blanks, CPhT Rx Patient Advocate Phone: [phone]

## 2023-08-22 ENCOUNTER — Telehealth: Payer: Self-pay | Admitting: Internal Medicine

## 2023-08-22 ENCOUNTER — Ambulatory Visit: Payer: 59 | Admitting: Surgery

## 2023-08-22 ENCOUNTER — Encounter: Payer: Self-pay | Admitting: Pediatrics

## 2023-08-22 ENCOUNTER — Other Ambulatory Visit: Payer: 59

## 2023-08-22 NOTE — Telephone Encounter (Signed)
Rx dose was increased on 07/26/23 by Dr. Evelene Croon.  Requested Prescriptions  Pending Prescriptions Disp Refills   buPROPion (WELLBUTRIN SR) 150 MG 12 hr tablet [Pharmacy Med Name: buPROPion HCl ER (SR) 150 MG Oral Tablet Extended Release 12 Hour] 60 tablet 0    Sig: Take 1 tablet by mouth twice daily     Psychiatry: Antidepressants - bupropion Failed - 08/22/2023 12:50 PM      Failed - Cr in normal range and within 360 days    Creatinine  Date Value Ref Range Status  05/25/2014 0.85 0.60 - 1.30 mg/dL Final   Creatinine, Ser  Date Value Ref Range Status  08/21/2023 1.76 (H) 0.61 - 1.24 mg/dL Final         Failed - AST in normal range and within 360 days    AST  Date Value Ref Range Status  06/17/2023 183 (H) 15 - 41 U/L Final   SGOT(AST)  Date Value Ref Range Status  05/24/2014 22 15 - 37 Unit/L Final   AST (SGOT) Piccolo, Waived  Date Value Ref Range Status  05/17/2017 31 11 - 38 U/L Final         Failed - ALT in normal range and within 360 days    ALT  Date Value Ref Range Status  06/17/2023 113 (H) 0 - 44 U/L Final   SGPT (ALT)  Date Value Ref Range Status  05/24/2014 20 U/L Final    Comment:    14-63 NOTE: New Reference Range 01/20/14    ALT (SGPT) Piccolo, Waived  Date Value Ref Range Status  05/17/2017 47 10 - 47 U/L Final         Failed - Last BP in normal range    BP Readings from Last 1 Encounters:  08/21/23 (!) 138/90         Passed - Completed PHQ-2 or PHQ-9 in the last 360 days      Passed - Valid encounter within last 6 months    Recent Outpatient Visits           3 weeks ago Upper respiratory tract infection, unspecified type   [place] [facility] Jackolyn Confer, MD   3 months ago Routine general medical examination at a health care facility   [facility], [name], DO   4 months ago Tobacco abuse   [place] [facility] [place], [name], DO   5 months ago Anticoagulated on  Coumadin   [place] [facility] [place], Sherran Needs, NP   6 months ago Anticoagulated on Coumadin   [place] [facility] [place], Sherran Needs, NP       Future Appointments             In 2 weeks Sheilah Pigeon, [name] [facility]   In 1 month End, Cristal Deer, MD [facility] [place]   In 2 months [name], DO [facility], [facility]

## 2023-08-22 NOTE — Telephone Encounter (Signed)
Patient's friend, Selena Batten, is calling to state that patient was in urgent care with some type of sickness and is now on antibiotics. They were told to call and let cardiologist know since he is scheduled to have cardioversion on 02/21. Requesting call back to discuss further.

## 2023-08-22 NOTE — Telephone Encounter (Signed)
Message forwarded to Dr Lubertha Basque team for action

## 2023-08-23 NOTE — Pre-Procedure Instructions (Signed)
Reviewed patient's chart with Dr Mayford Knife who is scheduled to do cardioversion tomorrow.  After review of chart Dr Mayford Knife decided to cancel procedure for tomorrow.  Spoke with Ander Purpura, patient contact person informed her that procedure is cancelled for tomorrow.  The office with reschedule the procedure.

## 2023-08-24 ENCOUNTER — Ambulatory Visit (HOSPITAL_COMMUNITY): Admission: RE | Admit: 2023-08-24 | Payer: 59 | Source: Home / Self Care | Admitting: Cardiology

## 2023-08-24 ENCOUNTER — Encounter (HOSPITAL_COMMUNITY): Admission: RE | Payer: 59 | Source: Home / Self Care

## 2023-08-24 ENCOUNTER — Other Ambulatory Visit: Payer: Self-pay | Admitting: *Deleted

## 2023-08-24 ENCOUNTER — Telehealth: Payer: Self-pay | Admitting: *Deleted

## 2023-08-24 ENCOUNTER — Ambulatory Visit: Payer: 59 | Admitting: Physician Assistant

## 2023-08-24 SURGERY — CARDIOVERSION (CATH LAB)
Anesthesia: General

## 2023-08-24 MED ORDER — AMIODARONE HCL 200 MG PO TABS: 200.0000 mg | ORAL_TABLET | Freq: Two times a day (BID) | ORAL | Status: DC

## 2023-08-24 NOTE — Telephone Encounter (Signed)
-----   Message from Mariam Dollar Tillery sent at 08/23/2023 12:51 PM EST -----   Cardioversion 08/23/2022 rescheduled due to recent URI on antibiotics.     There also weren't recorded INRs for the past several weeks and he needs demonstrate that he doesn't have a low INR 4 weeks prior to cardioversion.       If they HAVE the missing weeks and his INR was therapeutic can schedule once he is outside of the infectious window (14 days to be safe)       If they do NOT have his INR date for the past month, will need to get 4 weekly therapeutic INRs in a row prior to [facility].    Would also go ahead and decrease his amiodarone to 200 mg BID for the time being please, so he is not on 400 long term.   [address] [place], [place]  08/23/2023 12:53 PM

## 2023-08-24 NOTE — Telephone Encounter (Signed)
Information was sent directly to me without inclusion of triage. Was out of office until today. Cardioversion canceled prior to my receipt of message.

## 2023-08-24 NOTE — Telephone Encounter (Signed)
Spoke with Selena Batten, okay per DPR and she is aware to decrease amiodarone to 200 mg BID. She checks his INR at home and she provided me with patients readings as follows: 07/23/23   4.2 07/24/23   4.2 07/25/23   5.1 07/26/23   4.0 07/27/23   3.9 07/28/23   3.6 07/29/23   4.3 07/30/23   4.5 08/01/23   2.8 08/02/23   2.9 08/03/23   3.1 08/05/23    4.4 08/06/23    4.3 08/07/23    4.1 08/08/23    4.0 08/11/23    4.0 08/12/23    4.5 08/13/23  4.0 08/14/23   3.0 08/15/23   3.2 08/19/23   4.1 08/23/23   4.8  I let her know that I would forward these readings to be reviewed and that I would call her back to get DCCV rescheduled.

## 2023-08-25 ENCOUNTER — Other Ambulatory Visit: Payer: Self-pay

## 2023-08-25 ENCOUNTER — Emergency Department (EMERGENCY_DEPARTMENT_HOSPITAL)
Admission: EM | Admit: 2023-08-25 | Discharge: 2023-08-25 | Disposition: A | Discharge status: Home / Self Care | Payer: 59 | Source: Home / Self Care | Attending: Emergency Medicine | Admitting: Emergency Medicine

## 2023-08-25 ENCOUNTER — Encounter (HOSPITAL_COMMUNITY): Payer: Self-pay | Admitting: Emergency Medicine

## 2023-08-25 ENCOUNTER — Emergency Department (HOSPITAL_COMMUNITY): Payer: 59

## 2023-08-25 ENCOUNTER — Telehealth: Payer: Self-pay | Admitting: Student

## 2023-08-25 ENCOUNTER — Other Ambulatory Visit: Payer: Self-pay | Admitting: Family Medicine

## 2023-08-25 DIAGNOSIS — J449 Chronic obstructive pulmonary disease, unspecified: Secondary | ICD-10-CM | POA: Insufficient documentation

## 2023-08-25 DIAGNOSIS — N189 Chronic kidney disease, unspecified: Secondary | ICD-10-CM | POA: Insufficient documentation

## 2023-08-25 DIAGNOSIS — Z7982 Long term (current) use of aspirin: Secondary | ICD-10-CM | POA: Insufficient documentation

## 2023-08-25 DIAGNOSIS — I4891 Unspecified atrial fibrillation: Secondary | ICD-10-CM | POA: Diagnosis not present

## 2023-08-25 DIAGNOSIS — I4729 Other ventricular tachycardia: Secondary | ICD-10-CM

## 2023-08-25 DIAGNOSIS — Z7984 Long term (current) use of oral hypoglycemic drugs: Secondary | ICD-10-CM | POA: Insufficient documentation

## 2023-08-25 DIAGNOSIS — I5020 Unspecified systolic (congestive) heart failure: Secondary | ICD-10-CM | POA: Insufficient documentation

## 2023-08-25 DIAGNOSIS — I472 Ventricular tachycardia, unspecified: Secondary | ICD-10-CM | POA: Insufficient documentation

## 2023-08-25 DIAGNOSIS — I4819 Other persistent atrial fibrillation: Secondary | ICD-10-CM | POA: Diagnosis not present

## 2023-08-25 DIAGNOSIS — Z7901 Long term (current) use of anticoagulants: Secondary | ICD-10-CM | POA: Insufficient documentation

## 2023-08-25 DIAGNOSIS — Z79899 Other long term (current) drug therapy: Secondary | ICD-10-CM | POA: Insufficient documentation

## 2023-08-25 DIAGNOSIS — I130 Hypertensive heart and chronic kidney disease with heart failure and stage 1 through stage 4 chronic kidney disease, or unspecified chronic kidney disease: Secondary | ICD-10-CM | POA: Insufficient documentation

## 2023-08-25 LAB — HEPATIC FUNCTION PANEL
ALT: 46 U/L — ABNORMAL HIGH (ref 0–44)
AST: 50 U/L — ABNORMAL HIGH (ref 15–41)
Albumin: 3.5 g/dL (ref 3.5–5.0)
Alkaline Phosphatase: 57 U/L (ref 38–126)
Bilirubin, Direct: 0.2 mg/dL (ref 0.0–0.2)
Indirect Bilirubin: 0.5 mg/dL (ref 0.3–0.9)
Total Bilirubin: 0.7 mg/dL (ref 0.0–1.2)
Total Protein: 7 g/dL (ref 6.5–8.1)

## 2023-08-25 LAB — BASIC METABOLIC PANEL
Anion gap: 13 (ref 5–15)
BUN: 34 mg/dL — ABNORMAL HIGH (ref 8–23)
CO2: 23 mmol/L (ref 22–32)
Calcium: 9.3 mg/dL (ref 8.9–10.3)
Chloride: 103 mmol/L (ref 98–111)
Creatinine, Ser: 1.67 mg/dL — ABNORMAL HIGH (ref 0.61–1.24)
GFR, Estimated: 44 mL/min — ABNORMAL LOW (ref >60)
Glucose, Bld: 133 mg/dL — ABNORMAL HIGH (ref 70–99)
Potassium: 3.7 mmol/L (ref 3.5–5.1)
Sodium: 139 mmol/L (ref 135–145)

## 2023-08-25 LAB — CBC
HCT: 42.4 % (ref 39.0–52.0)
Hemoglobin: 13.6 g/dL (ref 13.0–17.0)
MCH: 28.2 pg (ref 26.0–34.0)
MCHC: 32.1 g/dL (ref 30.0–36.0)
MCV: 88 fL (ref 80.0–100.0)
Platelets: 327 10*3/uL (ref 150–400)
RBC: 4.82 MIL/uL (ref 4.22–5.81)
RDW: 17.1 % — ABNORMAL HIGH (ref 11.5–15.5)
WBC: 15.7 10*3/uL — ABNORMAL HIGH (ref 4.0–10.5)
nRBC: 0 % (ref 0.0–0.2)

## 2023-08-25 LAB — PROTIME-INR
INR: 6.9 (ref 0.8–1.2)
Prothrombin Time: 60 s — ABNORMAL HIGH (ref 11.4–15.2)

## 2023-08-25 LAB — TROPONIN I (HIGH SENSITIVITY): Troponin I (High Sensitivity): 61 ng/L — ABNORMAL HIGH (ref <18)

## 2023-08-25 LAB — MAGNESIUM: Magnesium: 2.3 mg/dL (ref 1.7–2.4)

## 2023-08-25 LAB — BRAIN NATRIURETIC PEPTIDE: B Natriuretic Peptide: 394.9 pg/mL — ABNORMAL HIGH (ref 0.0–100.0)

## 2023-08-25 MED ORDER — AMIODARONE HCL IN DEXTROSE 360-4.14 MG/200ML-% IV SOLN: 30.0000 mg/h | INTRAVENOUS | Status: DC

## 2023-08-25 MED ORDER — AMIODARONE HCL IN DEXTROSE 360-4.14 MG/200ML-% IV SOLN
60.0000 mg/h | INTRAVENOUS | Status: DC
  Administered 2023-08-25: 60 mg/h via INTRAVENOUS
  Filled 2023-08-25: qty 200

## 2023-08-25 MED ORDER — AMIODARONE LOAD VIA INFUSION
150.0000 mg | Freq: Once | INTRAVENOUS | Status: AC
  Administered 2023-08-25: 150 mg via INTRAVENOUS
  Filled 2023-08-25: qty 83.34

## 2023-08-25 NOTE — Discharge Instructions (Signed)
 The cardiologist suggested your pacemaker to put you back into atrial fibrillation.  You should hold your Coumadin dose for the next 2 days and then restart and make sure you are checking your INR at home.  If it is consistently elevated or he develop any bleeding you need to return to the emergency department.  Cardiology will call you to schedule follow-up.  You develop chest pain, fainting, fast heart rate that is not improving you need to return to the ED.

## 2023-08-25 NOTE — Consult Note (Signed)
 Cardiology Consultation   Patient ID: Roberto [name] MRN: [id number]; DOB: [birth year]  Admit date: 08/25/2023 Date of Consult: 08/25/2023  PCP:  [name], DO   [facility] Providers Cardiologist:  Yvonne Kendall, MD  Electrophysiologist:  Lewayne Bunting, MD  Sleep Medicine:  Armanda Magic, MD       Patient Profile:   Roberto [name] is a 68 y.o. male with a hx of VT and atrial fib who is being seen 08/25/2023 for the evaluation of VT and an elevated INR at the request of Dr. Earlene Plater.  History of Present Illness:   Roberto [name] is a 68 yo man with a h/o mechanical aortic valve replacement on home coumadin, VT, persistent atrial fib s/p ICD insertion. He has been on amiodarone. He was scheduled to undergo DCCV yesterday but the procedure was cancelled due to an URI. He denies palpitations or worsening dyspnea. His wife noted that his home INR was above 8 on 2 separate checks earlieer today and he presents for eval. He was noted on 12 lead ECG to have VT at 130/min. He has not had an ICD shock and did not have palpitations. He has chronic class 3 CHF symptoms.    Past Medical History:  Diagnosis Date   Aneurysm (HCC)    Bicuspid aortic valve    a. s/p #27 [employer] mechanical valve on 03/25/2010; b. on Coumadin; c. TTE 12/17: EF 40-45%, moderately dilated LV with moderate LVH, AVR well-seated with 14 mmHg gradient, peak AV velocity 2.5 m/s, mild mitral valve thickening with mild MR, mildly dilated RV with mildly reduced contraction   Cellulitis    CHF (congestive heart failure) (HCC)    Chronic kidney disease    Chronic systolic CHF (congestive heart failure) (HCC)    a. R/LHC 03/2010 showed no significant CAD, LVEDP 31 mmHg, mean AoV gradient 34 mmHg at rest and 47 mmHg with dobutamine 20 mcg/kg/min, AVA 1.0 cm^2, RA 31, RV 68/25, PA 68/47, PCWP 38. PA sat 65%. CO 6.2 L/min (Fick) and 5.3 L/min (thermodilution)   Clotting disorder (HCC)    COPD (chronic obstructive  pulmonary disease) (HCC)    H/O mechanical aortic valve replacement 03/25/2010   a. #27 [employer] mechanical valve   Hearing loss    High cholesterol    HTN (hypertension)    Hypercholesterolemia    Renal infarct [facility]) 2017   Multiple right renal infarcts, likely embolic.   Stage 3 chronic kidney disease (HCC)    Stroke [facility])    TIA (transient ischemic attack) 05/2014    Past Surgical History:  Procedure Laterality Date   AORTIC VALVE REPLACEMENT     AORTIC VALVE SURGERY     CARDIAC CATHETERIZATION  03/21/2010   No significant CAD. Severe aortic stenosis. Severely elevated left and right heart filling pressures.   CARDIAC SURGERY  2009   CHF   CARPAL TUNNEL RELEASE Left 2005   ICD IMPLANT N/A 06/25/2023   Procedure: ICD IMPLANT;  Surgeon: Marinus Maw, MD;  Location: [facility] INVASIVE CV LAB;  Service: Cardiovascular;  Laterality: N/A;   PACEMAKER IMPLANT     RIGHT HEART CATH AND CORONARY ANGIOGRAPHY N/A 01/28/2019   Procedure: RIGHT HEART CATH AND CORONARY ANGIOGRAPHY;  Surgeon: Yvonne Kendall, MD;  Location: [facility] INVASIVE CV LAB;  Service: Cardiovascular;  Laterality: N/A;   RIGHT HEART CATH AND CORONARY ANGIOGRAPHY N/A 06/20/2023   Procedure: RIGHT HEART CATH AND CORONARY ANGIOGRAPHY;  Surgeon: Yvonne Kendall, MD;  Location: [facility] INVASIVE CV LAB;  Service: Cardiovascular;  Laterality: N/A;   TONSILLECTOMY  1962     Home Medications:  Prior to Admission medications   Medication Sig Start Date End Date Taking? Authorizing Provider  acetaminophen (TYLENOL) 500 MG tablet Take 1,000 mg by mouth every 6 (six) hours as needed for moderate pain (pain score 4-6).    [provider]  albuterol (PROVENTIL) (2.5 MG/3ML) 0.083% nebulizer solution USE 1 VIAL IN NEBULIZER EVERY 6 HOURS AS NEEDED FOR WHEEZING FOR SHORTNESS OF BREATH 06/07/22   [name], [name], DO  albuterol (PROVENTIL) (2.5 MG/3ML) 0.083% nebulizer solution Take 3 mLs (2.5 mg total) by nebulization every 4 (four)  hours as needed for wheezing or shortness of breath. 08/21/23   Domenick Gong, MD  allopurinol (ZYLOPRIM) 300 MG tablet Take 1 tablet (300 mg total) by mouth daily. 05/16/23   [name], [name], DO  allopurinol (ZYLOPRIM) 300 MG tablet Take 300 mg by mouth daily. 07/03/23   [provider]  amiodarone (PACERONE) 200 MG tablet Take 1 tablet (200 mg total) by mouth 2 (two) times daily. 08/24/23   Graciella Freer, [name]  amoxicillin-clavulanate (AUGMENTIN) 875-125 MG tablet Take 1 tablet by mouth every 12 (twelve) hours. 08/21/23   Domenick Gong, MD  aspirin 81 MG chewable tablet Chew 81 mg by mouth daily.    [provider]  buPROPion (WELLBUTRIN SR) 150 MG 12 hr tablet Take 150 mg by mouth 2 (two) times daily. 07/27/23   [provider]  buPROPion (WELLBUTRIN SR) 150 MG 12 hr tablet Take 150 mg by mouth 2 (two) times daily. 07/27/23   [provider]  buPROPion (WELLBUTRIN XL) 300 MG 24 hr tablet Take 1 tablet (300 mg total) by mouth daily. Patient not taking: Reported on 08/08/2023 07/27/23 07/26/24  Jackolyn Confer, MD  buPROPion (WELLBUTRIN XL) 300 MG 24 hr tablet Take 300 mg by mouth daily. 08/16/23   [provider]  clotrimazole (MYCELEX) 10 MG troche Take 10 mg by mouth 5 (five) times daily. 07/27/23   [provider]  dapagliflozin propanediol (FARXIGA) 10 MG TABS tablet Take 1 tablet (10 mg total) by mouth daily before breakfast. 07/05/23   Clarisa Kindred A, FNP  FARXIGA 10 MG TABS tablet Take 10 mg by mouth daily. 05/30/23   [provider]  fenofibrate (TRICOR) 48 MG tablet Take 1 tablet (48 mg total) by mouth daily. 12/25/22   End, Cristal Deer, MD  fenofibrate (TRICOR) 48 MG tablet Take 48 mg by mouth daily. 06/08/23   [provider]  furosemide (LASIX) 80 MG tablet Take 80 mg by mouth daily. 05/17/23   [provider]  KLOR-CON M20 20 MEQ tablet Take 20 mEq by mouth daily. 03/30/23   [provider]   lisinopril (ZESTRIL) 2.5 MG tablet Take 2.5 mg by mouth daily. 06/25/23   [provider]  losartan (COZAAR) 25 MG tablet Take 0.5 tablets (12.5 mg total) by mouth at bedtime. 08/01/23   Sabharwal, Aditya, DO  losartan (COZAAR) 25 MG tablet Take 12.5 mg by mouth at bedtime. 08/01/23   [provider]  metFORMIN (GLUCOPHAGE-XR) 500 MG 24 hr tablet Take 1 tablet (500 mg total) by mouth daily with breakfast. 05/16/23   Laural Benes, [name], DO  metFORMIN (GLUCOPHAGE-XR) 500 MG 24 hr tablet Take 500 mg by mouth every morning. 06/29/23   [provider]  nicotine (NICODERM CQ - DOSED IN MG/24 HR) 7 mg/24hr patch 7 mg daily. 07/23/23   [provider]  nortriptyline (PAMELOR) 10 MG capsule Take 1 capsule (10 mg total) by mouth at bedtime. 05/16/23   [name], [name], DO  nortriptyline (PAMELOR) 10 MG capsule Take 10 mg by mouth at bedtime. 08/12/23   [provider]  Omega-3 Fatty Acids (OMEGA 3 PO) Take 1,500 mg by mouth 2 (two) times daily.    [provider]  OZEMPIC, 1 MG/DOSE, 4 MG/3ML SOPN Inject 1 mg into the skin once a week. 07/23/23   [provider]  potassium chloride SA (KLOR-CON M) 20 MEQ tablet Take 20 mEq by mouth daily.    [provider]  predniSONE (DELTASONE) 20 MG tablet Take 2 tablets (40 mg total) by mouth daily with breakfast for 5 days. 08/21/23 08/26/23  Domenick Gong, MD  rosuvastatin (CRESTOR) 40 MG tablet Take 1 tablet (40 mg total) by mouth daily. 04/06/23   End, Cristal Deer, MD  rosuvastatin (CRESTOR) 40 MG tablet Take 40 mg by mouth daily. 03/25/23   [provider]  Semaglutide, 1 MG/DOSE, 4 MG/3ML SOPN Inject 1 mg as directed once a week. 05/16/23   [name], DO  Spacer/Aero-Holding Chambers (AEROCHAMBER MV) inhaler Use as instructed 08/21/23   Domenick Gong, MD  Spacer/Aero-Holding Chambers [facility] SPACE CHAMBER ANTI-STATIC) DEVI See admin instructions. 07/13/23   [provider]   Spacer/Aero-Holding Rudean Curt Use as directed Dx: COPD, J44.9 07/13/23   Olevia Perches P, DO  spironolactone (ALDACTONE) 25 MG tablet Take 0.5 tablets (12.5 mg total) by mouth daily. 07/12/23   End, Cristal Deer, MD  spironolactone (ALDACTONE) 25 MG tablet Take 12.5 mg by mouth daily. 07/12/23   [provider]  torsemide (DEMADEX) 20 MG tablet Take 1 tablet (20 mg total) by mouth daily. Patient taking differently: Take 20 mg by mouth 2 (two) times daily. 07/13/23   End, Cristal Deer, MD  torsemide (DEMADEX) 20 MG tablet Take 40 mg by mouth daily. 08/05/23   [provider]  TRELEGY ELLIPTA 100-62.5-25 MCG/ACT AEPB Inhale 1 puff by mouth once daily Patient taking differently: Inhale 1 puff into the lungs daily as needed (shortness of breaths). 05/16/23   [name], [name], DO  TRELEGY ELLIPTA 100-62.5-25 MCG/ACT AEPB Inhale 1 puff into the lungs daily. 06/05/23   [provider]  VENTOLIN HFA 108 (90 Base) MCG/ACT inhaler INHALE 2 PUFFS BY MOUTH EVERY 6 HOURS AS NEEDED FOR WHEEZING OR SHORTNESS OF BREATH 05/16/23   [name], [name], DO  VENTOLIN HFA 108 (90 Base) MCG/ACT inhaler SMARTSIG:2 Puff(s) By Mouth Every 6 Hours PRN 07/04/23   [provider]  warfarin (COUMADIN) 4 MG tablet TAKE 1 TABLET BY MOUTH ONCE DAILY AT  6PM  AS  DIRECTED Patient taking differently: TAKE 4MG  BY MOUTH FOR 2 DAYS, THEN TAKE 7.5MG  ON THE THIRD DAY, THEN REPEAT 06/27/23   Lee, [place], NP  warfarin (COUMADIN) 4 MG tablet SMARTSIG:1 Tablet(s) By Mouth Every Evening 08/15/23   [provider]  warfarin (COUMADIN) 7.5 MG tablet TAKE 4MG  BY MOUTH FOR 2 DAYS, THEN TAKE 7.5MG  ON THE THIRD DAY, THEN REPEAT 06/27/23   Lee, [place], NP    Inpatient Medications: Scheduled Meds:  Continuous Infusions:  amiodarone Stopped (08/25/23 1347)   Followed by   amiodarone     PRN Meds:   Allergies:    Allergies  Allergen Reactions   Nicotine     Patches caused localized rash and hives      Social History:   Social History   Socioeconomic History   Marital  status: Single    Spouse name: Not on file   Number of children: 1   Years of education: 4   Highest education level: Associate degree: academic program  Occupational History   Occupation: Disabled    Employer: UNEMPLOYED  Tobacco Use   Smoking status: Every Day    Types: Cigarettes   Smokeless tobacco: Never  Vaping Use   Vaping status: Never Used  Substance and Sexual Activity   Alcohol use: Never   Drug use: Never   Sexual activity: Not Currently  Other Topics Concern   Not on file  Social History Narrative   ** Merged History Encounter **       Social Drivers of Health   Financial Resource Strain: Low Risk  (08/21/2022)   Overall Financial Resource Strain (CARDIA)    Difficulty of Paying Living Expenses: Not very hard  Food Insecurity: No Food Insecurity (06/29/2023)   Hunger Vital Sign    Worried About Running Out of Food in the Last Year: Never true    Ran Out of Food in the Last Year: Never true  Transportation Needs: No Transportation Needs (06/29/2023)   PRAPARE - Administrator, Civil Service (Medical): No    Lack of Transportation (Non-Medical): No  Physical Activity: Inactive (08/21/2022)   Exercise Vital Sign    Days of Exercise per Week: 0 days    Minutes of Exercise per Session: 0 min  Stress: No Stress Concern Present (08/21/2022)   [employer] of Occupational Health - Occupational Stress Questionnaire    Feeling of Stress : Only a little  Social Connections: Socially Isolated (08/21/2022)   Social Connection and Isolation Panel [NHANES]    Frequency of Communication with Friends and Family: More than three times a week    Frequency of Social Gatherings with Friends and Family: Never    Attends Religious Services: Never    Database administrator or Organizations: No    Attends Banker Meetings: Never    Marital Status: Divorced  Catering manager  Violence: Patient Unable To Answer (06/29/2023)   Humiliation, Afraid, Rape, and Kick questionnaire    Fear of Current or Ex-Partner: Patient unable to answer    Emotionally Abused: Patient unable to answer    Physically Abused: Patient unable to answer    Sexually Abused: Patient unable to answer    Family History:    Family History  Problem Relation Age of Onset   Arthritis Mother    Dementia Mother    Colon cancer Mother    Arthritis Father    Diabetes Father    Stroke Father    Colon cancer Father    Heart attack Brother    Breast cancer Sister    Seizures Sister    Cancer Brother        brain   Heart disease Brother    Heart attack Brother      ROS:  Please see the history of present illness.   All other ROS reviewed and negative.     Physical Exam/Data:   Vitals:   08/25/23 1256 08/25/23 1300 08/25/23 1330 08/25/23 1345  BP: (!) 115/98 (!) 118/97 96/73 106/82  Pulse: (!) 132 (!) 130 (!) 129 (!) 111  Resp: 20 19 (!) 26 (!) 23  Temp:      TempSrc:      SpO2: 100% 96% 96% 96%  Weight:        Intake/Output Summary (Last 24 hours) at 08/25/2023  1352 Last data filed at 08/25/2023 1347 Gross per 24 hour  Intake 98.23 ml  Output --  Net 98.23 ml      08/25/2023   11:58 AM 08/02/2023    8:08 AM 08/01/2023    9:54 AM  Last 3 Weights  Weight (lbs) 260 lb 2.3 oz 260 lb 6.4 oz 262 lb  Weight (kg) 118 kg 118.117 kg 118.842 kg     Body mass index is 41.99 kg/m.  General:  Well nourished, well developed, in no acute distress HEENT: normal Neck: no JVD Vascular: No carotid bruits; Distal pulses 2+ bilaterally Cardiac:  normal S1, S2; RRR; no murmur reg tachy Lungs:  clear to auscultation bilaterally, no wheezing, rhonchi or rales  Abd: soft, nontender, no hepatomegaly  Ext: no edema Musculoskeletal:  No deformities, BUE and BLE strength normal and equal Skin: warm and dry  Neuro:  CNs 2-12 intact, no focal abnormalities noted Psych:  Normal affect   EKG:   The EKG was personally reviewed and demonstrates:  VT with a left bundle QRS. Telemetry:  Telemetry was personally reviewed and demonstrates:  VT at 130/min  Relevant CV Studies: none  Laboratory Data:  High Sensitivity Troponin:   Recent Labs  Lab 08/25/23 1157  TROPONINIHS 61*     Chemistry Recent Labs  Lab 08/21/23 1923 08/25/23 1157  NA 138 139  K 4.0 3.7  CL 102 103  CO2 25 23  GLUCOSE 86 133*  BUN 33* 34*  CREATININE 1.76* 1.67*  CALCIUM 9.2 9.3  MG  --  2.3  GFRNONAA 42* 44*  ANIONGAP 11 13    Recent Labs  Lab 08/25/23 1157  PROT 7.0  ALBUMIN 3.5  AST 50*  ALT 46*  ALKPHOS 57  BILITOT 0.7   Lipids No results for input(s): "CHOL", "TRIG", "HDL", "LABVLDL", "LDLCALC", "CHOLHDL" in the last 168 hours.  Hematology Recent Labs  Lab 08/25/23 1157  WBC 15.7*  RBC 4.82  HGB 13.6  HCT 42.4  MCV 88.0  MCH 28.2  MCHC 32.1  RDW 17.1*  PLT 327   Thyroid No results for input(s): "TSH", "FREET4" in the last 168 hours.  BNPNo results for input(s): "BNP", "PROBNP" in the last 168 hours.  DDimer No results for input(s): "DDIMER" in the last 168 hours.   Radiology/Studies:  [facility] Chest Port 1 View Result Date: 08/25/2023 CLINICAL DATA:  Tachycardia. EXAM: PORTABLE CHEST 1 VIEW COMPARISON:  June 26, 2023. FINDINGS: Stable cardiomegaly. Status post sternotomy. Single lead left-sided pacemaker is again noted. Both lungs are clear. The visualized skeletal structures are unremarkable. IMPRESSION: No active disease. Electronically Signed   By: Lupita Raider M.D.   On: 08/25/2023 13:33   DG Chest 2 View Result Date: 08/21/2023 CLINICAL DATA:  h/o CHF, COPD, productive cough r/o acute pulmonary edema, PNA, effusion EXAM: CHEST - 2 VIEW COMPARISON:  None Available. FINDINGS: Post median sternotomy and prosthetic cardiac valve. The heart is enlarged. Single lead left-sided pacemaker in place. Mild aortic tortuosity. Mild peribronchial thickening. No definite septal  thickening. No focal airspace disease. No pleural effusion. No pneumothorax. No acute osseous findings. IMPRESSION: 1. Cardiomegaly post median sternotomy with prosthetic aortic valve. 2. Bronchial thickening.  No convincing pulmonary edema. Electronically Signed   By: Narda Rutherford M.D.   On: 08/21/2023 20:43     Assessment and Plan:   VT - he was essentially asymptomatic. I have interrogated his device, reprogrammed his ICD to give ATP down to 125/min, and the  device has paced him out of VT and back to atrial fib with a CVR/RVR around 110/min. His morphology in afib is very different then in VT. Elevated INR - in the hospital ER, the INR is 6.9. He is not actively bleeding. I asked him to hold the coumadin for 2 days then restart at 4 mg daily dose and to continue checking the iNR. I discussed with his wife and she understands. AFib - Our office will reschedule him for DCCV in the coming weeks.  URI - he is improving after anti-biotics. He will reduce his dose.  Disp - ok for DC as noted above.   For questions or updates, please contact [facility] Please consult [email or web address] for contact info under    Signed, Lewayne Bunting, MD  08/25/2023 1:52 PM

## 2023-08-25 NOTE — ED Triage Notes (Signed)
 Presents from home for INR >8 on home meter after being 4.8 last night (skipped PM dose after noting this) Denies pain in abd, CP, bleeding gums, blood in stool, blood in urine  H/o AAA, has an ICD/pacemaker, afib (was going to have a cardioversion today but was canceled for URI sx)  Recent decrease in amiodarone

## 2023-08-25 NOTE — Telephone Encounter (Signed)
   Ander Purpura, patient's care taker and "companion", called Answering Service with concerns about patient's INR. Patient is on Coumadin for mechanical AVR. INR was 4.8 yesterday. However, this morning was >8 x2. He is not have any active bleeding. However, Selena Batten stated they are already on the way to the ED. Luanna Cole that we will let the ED do initial work-up but we are here if they need anything.  Corrin Parker, [name] 08/25/2023 11:30 AM

## 2023-08-25 NOTE — ED Notes (Signed)
 Dr Ladona Ridgel advised to DC the amiodarone.

## 2023-08-25 NOTE — ED Provider Notes (Signed)
 [place] EMERGENCY DEPARTMENT AT [facility] Provider Note   CSN: [id number] Arrival date & time: 08/25/23  1140     History  Chief Complaint  Patient presents with   Abnormal Lab    Roberto [name] is a 68 y.o. male.   Abnormal Lab Is a 68 year old male history of hypertension, hyperlipidemia, CKD, COPD, systolic heart failure, prior ventricular tachycardia status post dual-chamber ICD, atrial fibrillation and mechanical valve on Coumadin presenting for elevated INR.  Patient was post to get cardioversion last night.  However he went to urgent care recently and was diagnosed with URI and put on prednisone and amoxicillin.  They have been weaning his amiodarone when she decreased the dose of last night.  INR was 4.8 last night, skipped his dose of warfarin last night and then this morning it was over 8.  He denies any chest pain short of breath, syncope, abdominal pain, bleeding.  No shocks he is aware of.     Home Medications Prior to Admission medications   Medication Sig Start Date End Date Taking? Authorizing Provider  acetaminophen (TYLENOL) 500 MG tablet Take 1,000 mg by mouth every 6 (six) hours as needed for moderate pain (pain score 4-6).    [provider]  albuterol (PROVENTIL) (2.5 MG/3ML) 0.083% nebulizer solution USE 1 VIAL IN NEBULIZER EVERY 6 HOURS AS NEEDED FOR WHEEZING FOR SHORTNESS OF BREATH 06/07/22   [name], [name], DO  albuterol (PROVENTIL) (2.5 MG/3ML) 0.083% nebulizer solution Take 3 mLs (2.5 mg total) by nebulization every 4 (four) hours as needed for wheezing or shortness of breath. 08/21/23   Domenick Gong, MD  allopurinol (ZYLOPRIM) 300 MG tablet Take 1 tablet (300 mg total) by mouth daily. 05/16/23   [name], [name], DO  allopurinol (ZYLOPRIM) 300 MG tablet Take 300 mg by mouth daily. 07/03/23   [provider]  amiodarone (PACERONE) 200 MG tablet Take 1 tablet (200 mg total) by mouth 2 (two) times daily. 08/24/23    Graciella Freer, [name]  amoxicillin-clavulanate (AUGMENTIN) 875-125 MG tablet Take 1 tablet by mouth every 12 (twelve) hours. 08/21/23   Domenick Gong, MD  aspirin 81 MG chewable tablet Chew 81 mg by mouth daily.    [provider]  buPROPion (WELLBUTRIN SR) 150 MG 12 hr tablet Take 150 mg by mouth 2 (two) times daily. 07/27/23   [provider]  buPROPion (WELLBUTRIN SR) 150 MG 12 hr tablet Take 150 mg by mouth 2 (two) times daily. 07/27/23   [provider]  buPROPion (WELLBUTRIN XL) 300 MG 24 hr tablet Take 1 tablet (300 mg total) by mouth daily. Patient not taking: Reported on 08/08/2023 07/27/23 07/26/24  Jackolyn Confer, MD  buPROPion (WELLBUTRIN XL) 300 MG 24 hr tablet Take 300 mg by mouth daily. 08/16/23   [provider]  clotrimazole (MYCELEX) 10 MG troche Take 10 mg by mouth 5 (five) times daily. 07/27/23   [provider]  dapagliflozin propanediol (FARXIGA) 10 MG TABS tablet Take 1 tablet (10 mg total) by mouth daily before breakfast. 07/05/23   Clarisa Kindred A, FNP  FARXIGA 10 MG TABS tablet Take 10 mg by mouth daily. 05/30/23   [provider]  fenofibrate (TRICOR) 48 MG tablet Take 1 tablet (48 mg total) by mouth daily. 12/25/22   End, Cristal Deer, MD  fenofibrate (TRICOR) 48 MG tablet Take 48 mg by mouth daily. 06/08/23   [provider]  furosemide (LASIX) 80 MG tablet Take 80 mg by  mouth daily. 05/17/23   [provider]  KLOR-CON M20 20 MEQ tablet Take 20 mEq by mouth daily. 03/30/23   [provider]  lisinopril (ZESTRIL) 2.5 MG tablet Take 2.5 mg by mouth daily. 06/25/23   [provider]  losartan (COZAAR) 25 MG tablet Take 0.5 tablets (12.5 mg total) by mouth at bedtime. 08/01/23   Sabharwal, Aditya, DO  losartan (COZAAR) 25 MG tablet Take 12.5 mg by mouth at bedtime. 08/01/23   [provider]  metFORMIN (GLUCOPHAGE-XR) 500 MG 24 hr tablet Take 1 tablet (500 mg total) by mouth  daily with breakfast. 05/16/23   Laural Benes, [name], DO  metFORMIN (GLUCOPHAGE-XR) 500 MG 24 hr tablet Take 500 mg by mouth every morning. 06/29/23   [provider]  nicotine (NICODERM CQ - DOSED IN MG/24 HR) 7 mg/24hr patch 7 mg daily. 07/23/23   [provider]  nortriptyline (PAMELOR) 10 MG capsule Take 1 capsule (10 mg total) by mouth at bedtime. 05/16/23   [name], [name], DO  nortriptyline (PAMELOR) 10 MG capsule Take 10 mg by mouth at bedtime. 08/12/23   [provider]  Omega-3 Fatty Acids (OMEGA 3 PO) Take 1,500 mg by mouth 2 (two) times daily.    [provider]  OZEMPIC, 1 MG/DOSE, 4 MG/3ML SOPN Inject 1 mg into the skin once a week. 07/23/23   [provider]  potassium chloride SA (KLOR-CON M) 20 MEQ tablet Take 20 mEq by mouth daily.    [provider]  predniSONE (DELTASONE) 20 MG tablet Take 2 tablets (40 mg total) by mouth daily with breakfast for 5 days. 08/21/23 08/26/23  Domenick Gong, MD  rosuvastatin (CRESTOR) 40 MG tablet Take 1 tablet (40 mg total) by mouth daily. 04/06/23   End, Cristal Deer, MD  rosuvastatin (CRESTOR) 40 MG tablet Take 40 mg by mouth daily. 03/25/23   [provider]  Semaglutide, 1 MG/DOSE, 4 MG/3ML SOPN Inject 1 mg as directed once a week. 05/16/23   [name], DO  Spacer/Aero-Holding Chambers (AEROCHAMBER MV) inhaler Use as instructed 08/21/23   Domenick Gong, MD  Spacer/Aero-Holding Chambers [facility] SPACE CHAMBER ANTI-STATIC) DEVI See admin instructions. 07/13/23   [provider]  Spacer/Aero-Holding Rudean Curt Use as directed Dx: COPD, J44.9 07/13/23   Olevia Perches P, DO  spironolactone (ALDACTONE) 25 MG tablet Take 0.5 tablets (12.5 mg total) by mouth daily. 07/12/23   End, Cristal Deer, MD  spironolactone (ALDACTONE) 25 MG tablet Take 12.5 mg by mouth daily. 07/12/23   [provider]  torsemide (DEMADEX) 20 MG tablet Take 1 tablet (20 mg total) by mouth daily. Patient  taking differently: Take 20 mg by mouth 2 (two) times daily. 07/13/23   End, Cristal Deer, MD  torsemide (DEMADEX) 20 MG tablet Take 40 mg by mouth daily. 08/05/23   [provider]  TRELEGY ELLIPTA 100-62.5-25 MCG/ACT AEPB Inhale 1 puff by mouth once daily Patient taking differently: Inhale 1 puff into the lungs daily as needed (shortness of breaths). 05/16/23   [name], [name], DO  TRELEGY ELLIPTA 100-62.5-25 MCG/ACT AEPB Inhale 1 puff into the lungs daily. 06/05/23   [provider]  VENTOLIN HFA 108 (90 Base) MCG/ACT inhaler INHALE 2 PUFFS BY MOUTH EVERY 6 HOURS AS NEEDED FOR WHEEZING OR SHORTNESS OF BREATH 05/16/23   [name], [name], DO  VENTOLIN HFA 108 (90 Base) MCG/ACT inhaler SMARTSIG:2 Puff(s) By Mouth Every 6 Hours PRN 07/04/23   [provider]  warfarin (COUMADIN) 4 MG  tablet TAKE 1 TABLET BY MOUTH ONCE DAILY AT  6PM  AS  DIRECTED Patient taking differently: TAKE 4MG  BY MOUTH FOR 2 DAYS, THEN TAKE 7.5MG  ON THE THIRD DAY, THEN REPEAT 06/27/23   Lee, [place], NP  warfarin (COUMADIN) 4 MG tablet SMARTSIG:1 Tablet(s) By Mouth Every Evening 08/15/23   [provider]  warfarin (COUMADIN) 7.5 MG tablet TAKE 4MG  BY MOUTH FOR 2 DAYS, THEN TAKE 7.5MG  ON THE THIRD DAY, THEN REPEAT 06/27/23   Lee, [place], NP      Allergies    Nicotine    Review of Systems   Review of Systems Review of systems completed and notable as per HPI.  ROS otherwise negative.   Physical Exam Updated Vital Signs BP 96/77   Pulse (!) 118   Temp 97.8 F (36.6 C) (Oral)   Resp 20   Wt 118 kg   SpO2 96%   BMI 41.99 kg/m  Physical Exam Vitals and nursing note reviewed.  Constitutional:      General: He is not in acute distress.    Appearance: He is well-developed.  HENT:     Head: Normocephalic and atraumatic.  Eyes:     Conjunctiva/sclera: Conjunctivae normal.  Cardiovascular:     Rate and Rhythm: Regular rhythm. Tachycardia present.     Pulses: Normal pulses.     Heart  sounds: Normal heart sounds. No murmur heard. Pulmonary:     Effort: Pulmonary effort is normal. No respiratory distress.     Breath sounds: Normal breath sounds.  Abdominal:     Palpations: Abdomen is soft.     Tenderness: There is no abdominal tenderness.  Musculoskeletal:        General: No swelling.     Cervical back: Neck supple.     Right lower leg: Edema present.     Left lower leg: Edema present.  Skin:    General: Skin is warm and dry.     Capillary Refill: Capillary refill takes less than 2 seconds.  Neurological:     General: No focal deficit present.     Mental Status: He is alert and oriented to person, place, and time. Mental status is at baseline.  Psychiatric:        Mood and Affect: Mood normal.     ED Results / Procedures / Treatments   Labs (all labs ordered are listed, but only abnormal results are displayed) Labs Reviewed  BASIC METABOLIC PANEL - Abnormal; Notable for the following components:      Result Value   Glucose, Bld 133 (*)    BUN 34 (*)    Creatinine, Ser 1.67 (*)    GFR, Estimated 44 (*)    All other components within normal limits  CBC - Abnormal; Notable for the following components:   WBC 15.7 (*)    RDW 17.1 (*)    All other components within normal limits  PROTIME-INR - Abnormal; Notable for the following components:   Prothrombin Time 60.0 (*)    INR 6.9 (*)    All other components within normal limits  BRAIN NATRIURETIC PEPTIDE - Abnormal; Notable for the following components:   B Natriuretic Peptide 394.9 (*)    All other components within normal limits  HEPATIC FUNCTION PANEL - Abnormal; Notable for the following components:   AST 50 (*)    ALT 46 (*)    All other components within normal limits  TROPONIN I (HIGH SENSITIVITY) - Abnormal; Notable for the following components:  Troponin I (High Sensitivity) 61 (*)    All other components within normal limits  MAGNESIUM    EKG EKG Interpretation Date/Time:  Saturday  August 25 2023 12:35:43 EST Ventricular Rate:  131 PR Interval:  176 QRS Duration:  194 QT Interval:  426 QTC Calculation: 629 R Axis:   105  Text Interpretation: Ventricular tachycardia Abnormal ECG Confirmed by Fulton Reek [phone]) on 08/25/2023 1:56:25 PM  Radiology DG Chest Port 1 View Result Date: 08/25/2023 CLINICAL DATA:  Tachycardia. EXAM: PORTABLE CHEST 1 VIEW COMPARISON:  June 26, 2023. FINDINGS: Stable cardiomegaly. Status post sternotomy. Single lead left-sided pacemaker is again noted. Both lungs are clear. The visualized skeletal structures are unremarkable. IMPRESSION: No active disease. Electronically Signed   By: Lupita Raider M.D.   On: 08/25/2023 13:33    Procedures Procedures    Medications Ordered in ED Medications  amiodarone (NEXTERONE) 1.8 mg/mL load via infusion 150 mg (150 mg Intravenous Bolus from Bag 08/25/23 1304)    ED Course/ Medical Decision Making/ A&P Clinical Course as of 08/25/23 2100  Sat Aug 25, 2023  1259 Nishan cardiology: okay with amio, will come see [JD]  1420 Patient mains asymptomatic.  He is now back in A-fib after intervention by cardiology with his pacemaker.  They recommend discharge home and outpatient follow-up, recommend holding his Coumadin for the next 2 days and rechecking and having him follow-up in office.  Patient is comfortable this plan.  Labs notable for mild troponin elevation likely demand in setting of ventricular tachycardia, and actually similar to his prior.  Renal functions at baseline.  Mild transaminitis improved from prior.  Patient is comfortable this plan.  Strict return precautions were given. [JD]    Clinical Course User Index [JD] Laurence Spates, MD                                 Medical Decision Making Amount and/or Complexity of Data Reviewed Labs: ordered. Radiology: ordered.   Medical Decision Making:   Roberto [name] is a 68 y.o. male who presented to the ED today with elevated  INR.  He has no active bleeding.  However he is quite tachycardic and EKG is concerning for ventricular tachycardia.  I spoke with cardiology who evaluated patient in consultation.  They were able to pace him out of this and back into atrial fibrillation.  They recommend holding his Coumadin for the next 2 days, no indication for reversal at this point.  They will schedule him outpatient follow-up.  Given he is back in A-fib and no longer in ventricular tachycardia they feel he can safely follow-up as an outpatient.  Patient is comfortable with this plan and would like to go home.  He does have mild leukocytosis but no infectious symptoms.  BNP is mildly elevated does not look grossly volume overloaded.  Troponin slightly elevated likely demand in setting of ventricular tachycardia.  Patient remained stable and had no recurrence of ventricular tachycardia.  Strict return precautions were given.   Patient placed on continuous vitals and telemetry monitoring while in ED which was reviewed periodically.  Reviewed and confirmed nursing documentation for past medical history, family history, social history.   Patient's presentation is most consistent with acute complicated illness / injury requiring diagnostic workup.           Final Clinical Impression(s) / ED Diagnoses Final diagnoses:  Ventricular tachycardia (HCC)  Rx / DC Orders ED Discharge Orders     None         Laurence Spates, MD 08/25/23 2100

## 2023-08-25 NOTE — ED Provider Triage Note (Signed)
 Emergency Medicine Provider Triage Evaluation Note  Roberto [name] , a 68 y.o. male  was evaluated in triage.  Pt complains of elevated PT/INR.  Review of Systems  Positive:  Negative:   Physical Exam  BP 105/84 (BP Location: Right Arm)   Pulse (!) 130   Temp 97.8 F (36.6 C)   Resp 16   Wt 118 kg   SpO2 97%   BMI 41.99 kg/m  Gen:   Awake, no distress   Resp:  Normal effort  MSK:   Moves extremities without difficulty  Other:    Medical Decision Making  Medically screening exam initiated at 12:13 PM.  Appropriate orders placed.  Roberto [name] was informed that the remainder of the evaluation will be completed by another provider, this initial triage assessment does not replace that evaluation, and the importance of remaining in the ED until their evaluation is complete.  Patient on Warfarin. With PT/INR keeps elevating despite skipping the last dose yesterday. Patient stating that he feels fine right now. He did notice that his finger was bleeding for longer when they checked his PT/INR earlier today - no bleeding now. Patient also stating that afib is chronic for him and apparently he was supposed to get a cardioversion today. Patient did have some falls recently but he states that he does not have any pain and is walking without pain.  Denies headache, vision changes, chest pain, SOB, cough, abdominal pain, nausea, vomiting, diarrhea, hematochezia, hematuria.   Roberto [name], [place] 08/25/23 1216

## 2023-08-27 ENCOUNTER — Other Ambulatory Visit: Payer: Self-pay

## 2023-08-27 ENCOUNTER — Encounter (HOSPITAL_COMMUNITY): Payer: Self-pay

## 2023-08-27 ENCOUNTER — Telehealth: Payer: Self-pay

## 2023-08-27 ENCOUNTER — Inpatient Hospital Stay (HOSPITAL_COMMUNITY)
Admission: EM | Admit: 2023-08-27 | Discharge: 2023-08-30 | DRG: 273 | Disposition: A | Discharge status: Home / Self Care | Payer: 59 | Attending: Cardiology | Admitting: Cardiology

## 2023-08-27 ENCOUNTER — Emergency Department (HOSPITAL_COMMUNITY): Payer: 59

## 2023-08-27 DIAGNOSIS — Z7982 Long term (current) use of aspirin: Secondary | ICD-10-CM

## 2023-08-27 DIAGNOSIS — I4892 Unspecified atrial flutter: Secondary | ICD-10-CM | POA: Diagnosis present

## 2023-08-27 DIAGNOSIS — Z8679 Personal history of other diseases of the circulatory system: Secondary | ICD-10-CM

## 2023-08-27 DIAGNOSIS — I4891 Unspecified atrial fibrillation: Secondary | ICD-10-CM | POA: Diagnosis not present

## 2023-08-27 DIAGNOSIS — Z6841 Body Mass Index (BMI) 40.0 and over, adult: Secondary | ICD-10-CM

## 2023-08-27 DIAGNOSIS — Z95810 Presence of automatic (implantable) cardiac defibrillator: Secondary | ICD-10-CM

## 2023-08-27 DIAGNOSIS — F17210 Nicotine dependence, cigarettes, uncomplicated: Secondary | ICD-10-CM | POA: Diagnosis present

## 2023-08-27 DIAGNOSIS — Z823 Family history of stroke: Secondary | ICD-10-CM

## 2023-08-27 DIAGNOSIS — I5022 Chronic systolic (congestive) heart failure: Secondary | ICD-10-CM | POA: Diagnosis not present

## 2023-08-27 DIAGNOSIS — J069 Acute upper respiratory infection, unspecified: Secondary | ICD-10-CM | POA: Diagnosis present

## 2023-08-27 DIAGNOSIS — Z7985 Long-term (current) use of injectable non-insulin antidiabetic drugs: Secondary | ICD-10-CM

## 2023-08-27 DIAGNOSIS — Z79899 Other long term (current) drug therapy: Secondary | ICD-10-CM

## 2023-08-27 DIAGNOSIS — Z604 Social exclusion and rejection: Secondary | ICD-10-CM | POA: Diagnosis present

## 2023-08-27 DIAGNOSIS — H9190 Unspecified hearing loss, unspecified ear: Secondary | ICD-10-CM | POA: Diagnosis present

## 2023-08-27 DIAGNOSIS — Z7984 Long term (current) use of oral hypoglycemic drugs: Secondary | ICD-10-CM

## 2023-08-27 DIAGNOSIS — G4733 Obstructive sleep apnea (adult) (pediatric): Secondary | ICD-10-CM | POA: Diagnosis present

## 2023-08-27 DIAGNOSIS — I5023 Acute on chronic systolic (congestive) heart failure: Secondary | ICD-10-CM | POA: Diagnosis present

## 2023-08-27 DIAGNOSIS — N1831 Chronic kidney disease, stage 3a: Secondary | ICD-10-CM | POA: Diagnosis present

## 2023-08-27 DIAGNOSIS — E66813 Obesity, class 3: Secondary | ICD-10-CM | POA: Diagnosis present

## 2023-08-27 DIAGNOSIS — Z8673 Personal history of transient ischemic attack (TIA), and cerebral infarction without residual deficits: Secondary | ICD-10-CM

## 2023-08-27 DIAGNOSIS — I4819 Other persistent atrial fibrillation: Principal | ICD-10-CM | POA: Diagnosis present

## 2023-08-27 DIAGNOSIS — Z7901 Long term (current) use of anticoagulants: Secondary | ICD-10-CM

## 2023-08-27 DIAGNOSIS — J449 Chronic obstructive pulmonary disease, unspecified: Secondary | ICD-10-CM | POA: Diagnosis present

## 2023-08-27 DIAGNOSIS — R7401 Elevation of levels of liver transaminase levels: Secondary | ICD-10-CM | POA: Diagnosis present

## 2023-08-27 DIAGNOSIS — Z833 Family history of diabetes mellitus: Secondary | ICD-10-CM

## 2023-08-27 DIAGNOSIS — I472 Ventricular tachycardia, unspecified: Secondary | ICD-10-CM | POA: Diagnosis present

## 2023-08-27 DIAGNOSIS — D689 Coagulation defect, unspecified: Secondary | ICD-10-CM | POA: Diagnosis present

## 2023-08-27 DIAGNOSIS — E7800 Pure hypercholesterolemia, unspecified: Secondary | ICD-10-CM | POA: Diagnosis present

## 2023-08-27 DIAGNOSIS — Z7951 Long term (current) use of inhaled steroids: Secondary | ICD-10-CM

## 2023-08-27 DIAGNOSIS — Z8249 Family history of ischemic heart disease and other diseases of the circulatory system: Secondary | ICD-10-CM

## 2023-08-27 DIAGNOSIS — I429 Cardiomyopathy, unspecified: Secondary | ICD-10-CM | POA: Diagnosis present

## 2023-08-27 DIAGNOSIS — I130 Hypertensive heart and chronic kidney disease with heart failure and stage 1 through stage 4 chronic kidney disease, or unspecified chronic kidney disease: Secondary | ICD-10-CM | POA: Diagnosis present

## 2023-08-27 DIAGNOSIS — Z952 Presence of prosthetic heart valve: Secondary | ICD-10-CM

## 2023-08-27 LAB — BASIC METABOLIC PANEL
Anion gap: 12 (ref 5–15)
BUN: 26 mg/dL — ABNORMAL HIGH (ref 8–23)
CO2: 25 mmol/L (ref 22–32)
Calcium: 9.4 mg/dL (ref 8.9–10.3)
Chloride: 105 mmol/L (ref 98–111)
Creatinine, Ser: 1.56 mg/dL — ABNORMAL HIGH (ref 0.61–1.24)
GFR, Estimated: 48 mL/min — ABNORMAL LOW (ref >60)
Glucose, Bld: 87 mg/dL (ref 70–99)
Potassium: 4.1 mmol/L (ref 3.5–5.1)
Sodium: 142 mmol/L (ref 135–145)

## 2023-08-27 LAB — CBC
HCT: 43.3 % (ref 39.0–52.0)
Hemoglobin: 13.4 g/dL (ref 13.0–17.0)
MCH: 27.7 pg (ref 26.0–34.0)
MCHC: 30.9 g/dL (ref 30.0–36.0)
MCV: 89.6 fL (ref 80.0–100.0)
Platelets: 289 10*3/uL (ref 150–400)
RBC: 4.83 MIL/uL (ref 4.22–5.81)
RDW: 17.2 % — ABNORMAL HIGH (ref 11.5–15.5)
WBC: 11.7 10*3/uL — ABNORMAL HIGH (ref 4.0–10.5)
nRBC: 0 % (ref 0.0–0.2)

## 2023-08-27 LAB — TROPONIN I (HIGH SENSITIVITY)
Troponin I (High Sensitivity): 69 ng/L — ABNORMAL HIGH (ref <18)
Troponin I (High Sensitivity): 71 ng/L — ABNORMAL HIGH (ref <18)

## 2023-08-27 LAB — PROTIME-INR
INR: 3.5 — ABNORMAL HIGH (ref 0.8–1.2)
Prothrombin Time: 35.3 s — ABNORMAL HIGH (ref 11.4–15.2)

## 2023-08-27 LAB — MAGNESIUM: Magnesium: 2.1 mg/dL (ref 1.7–2.4)

## 2023-08-27 MED ORDER — WARFARIN - PHARMACIST DOSING INPATIENT
Freq: Every day | Status: DC
Start: 2023-08-28 — End: 2023-08-30

## 2023-08-27 MED ORDER — ALBUTEROL SULFATE (2.5 MG/3ML) 0.083% IN NEBU: 2.5000 mg | INHALATION_SOLUTION | Freq: Four times a day (QID) | RESPIRATORY_TRACT | Status: DC | PRN

## 2023-08-27 MED ORDER — ACETAMINOPHEN 500 MG PO TABS: 1000.0000 mg | ORAL_TABLET | Freq: Four times a day (QID) | ORAL | Status: DC | PRN

## 2023-08-27 MED ORDER — POTASSIUM CHLORIDE CRYS ER 20 MEQ PO TBCR
20.0000 meq | EXTENDED_RELEASE_TABLET | Freq: Once | ORAL | Status: AC
  Administered 2023-08-27: 20 meq via ORAL
  Filled 2023-08-27: qty 1

## 2023-08-27 MED ORDER — ALLOPURINOL 300 MG PO TABS
300.0000 mg | ORAL_TABLET | Freq: Every day | ORAL | Status: DC
  Administered 2023-08-29 – 2023-08-30 (×2): 300 mg via ORAL
  Filled 2023-08-27 (×2): qty 1

## 2023-08-27 MED ORDER — NORTRIPTYLINE HCL 10 MG PO CAPS
10.0000 mg | ORAL_CAPSULE | Freq: Every day | ORAL | Status: DC
  Administered 2023-08-27 – 2023-08-29 (×3): 10 mg via ORAL
  Filled 2023-08-27 (×4): qty 1

## 2023-08-27 MED ORDER — LIDOCAINE IN D5W 4-5 MG/ML-% IV SOLN
1.0000 mg/min | INTRAVENOUS | Status: DC
  Administered 2023-08-27: 1 mg/min via INTRAVENOUS
  Filled 2023-08-27: qty 500

## 2023-08-27 MED ORDER — ASPIRIN 81 MG PO CHEW
81.0000 mg | CHEWABLE_TABLET | Freq: Every day | ORAL | Status: DC
Start: 2023-08-28 — End: 2023-08-30
  Administered 2023-08-29 – 2023-08-30 (×2): 81 mg via ORAL
  Filled 2023-08-27 (×2): qty 1

## 2023-08-27 MED ORDER — AMIODARONE HCL IN DEXTROSE 360-4.14 MG/200ML-% IV SOLN
30.0000 mg/h | INTRAVENOUS | Status: DC
  Filled 2023-08-27 (×2): qty 200

## 2023-08-27 MED ORDER — METFORMIN HCL ER 500 MG PO TB24
500.0000 mg | ORAL_TABLET | Freq: Every day | ORAL | Status: DC
  Administered 2023-08-29 – 2023-08-30 (×2): 500 mg via ORAL
  Filled 2023-08-27 (×3): qty 1

## 2023-08-27 MED ORDER — SPIRONOLACTONE 12.5 MG HALF TABLET
12.5000 mg | ORAL_TABLET | Freq: Every day | ORAL | Status: DC
Start: 2023-08-28 — End: 2023-08-30
  Administered 2023-08-29 – 2023-08-30 (×2): 12.5 mg via ORAL
  Filled 2023-08-27 (×2): qty 1

## 2023-08-27 MED ORDER — LIDOCAINE BOLUS VIA INFUSION
100.0000 mg | Freq: Once | INTRAVENOUS | Status: AC
  Administered 2023-08-27: 100 mg via INTRAVENOUS
  Filled 2023-08-27: qty 100

## 2023-08-27 MED ORDER — ONDANSETRON HCL 4 MG/2ML IJ SOLN: 4.0000 mg | Freq: Four times a day (QID) | INTRAMUSCULAR | Status: DC | PRN

## 2023-08-27 MED ORDER — LOSARTAN POTASSIUM 25 MG PO TABS
12.5000 mg | ORAL_TABLET | Freq: Every day | ORAL | Status: DC
  Administered 2023-08-28: 12.5 mg via ORAL
  Filled 2023-08-27: qty 1
  Filled 2023-08-27: qty 0.5
  Filled 2023-08-27: qty 1

## 2023-08-27 MED ORDER — AMIODARONE HCL IN DEXTROSE 360-4.14 MG/200ML-% IV SOLN
60.0000 mg/h | INTRAVENOUS | Status: AC
  Administered 2023-08-27 – 2023-08-28 (×3): 60 mg/h via INTRAVENOUS
  Administered 2023-08-28: 30 mg/h via INTRAVENOUS
  Filled 2023-08-27 (×2): qty 200

## 2023-08-27 MED ORDER — DAPAGLIFLOZIN PROPANEDIOL 10 MG PO TABS
10.0000 mg | ORAL_TABLET | Freq: Every day | ORAL | Status: DC
Start: 2023-08-28 — End: 2023-08-30
  Administered 2023-08-29 – 2023-08-30 (×2): 10 mg via ORAL
  Filled 2023-08-27 (×2): qty 1

## 2023-08-27 MED ORDER — WARFARIN SODIUM 2 MG PO TABS
4.0000 mg | ORAL_TABLET | Freq: Once | ORAL | Status: DC
  Filled 2023-08-27 (×2): qty 1

## 2023-08-27 MED ORDER — AMIODARONE HCL IN DEXTROSE 360-4.14 MG/200ML-% IV SOLN
60.0000 mg/h | INTRAVENOUS | Status: DC
Start: 2023-08-27 — End: 2023-08-27
  Administered 2023-08-27 (×2): 60 mg/h via INTRAVENOUS

## 2023-08-27 MED ORDER — AMOXICILLIN-POT CLAVULANATE 875-125 MG PO TABS
1.0000 | ORAL_TABLET | Freq: Two times a day (BID) | ORAL | Status: AC
  Administered 2023-08-27 – 2023-08-28 (×2): 1 via ORAL
  Filled 2023-08-27 (×2): qty 1

## 2023-08-27 MED ORDER — AMIODARONE LOAD VIA INFUSION
150.0000 mg | Freq: Once | INTRAVENOUS | Status: AC
  Administered 2023-08-27: 150 mg via INTRAVENOUS
  Filled 2023-08-27: qty 83.34

## 2023-08-27 MED ORDER — ROSUVASTATIN CALCIUM 20 MG PO TABS
40.0000 mg | ORAL_TABLET | Freq: Every day | ORAL | Status: DC
Start: 2023-08-28 — End: 2023-08-30
  Administered 2023-08-29 – 2023-08-30 (×2): 40 mg via ORAL
  Filled 2023-08-27 (×2): qty 2

## 2023-08-27 MED ORDER — FUROSEMIDE 10 MG/ML IJ SOLN
80.0000 mg | Freq: Once | INTRAMUSCULAR | Status: AC
  Administered 2023-08-27: 80 mg via INTRAVENOUS
  Filled 2023-08-27: qty 8

## 2023-08-27 MED ORDER — FENOFIBRATE 54 MG PO TABS
54.0000 mg | ORAL_TABLET | Freq: Every day | ORAL | Status: DC
  Administered 2023-08-27 – 2023-08-30 (×3): 54 mg via ORAL
  Filled 2023-08-27 (×4): qty 1

## 2023-08-27 MED ORDER — ACETAMINOPHEN 325 MG PO TABS: 650.0000 mg | ORAL_TABLET | ORAL | Status: DC | PRN

## 2023-08-27 NOTE — ED Notes (Signed)
 Alex RN spoke to Cardiology-Taylor and states to leave amio at dosage at 30 mg/hr. MD will look over chart and go through everything

## 2023-08-27 NOTE — ED Notes (Signed)
 MD-Lawsing at bedside due to patient being in sustained vtach. RN reached out to Cardiology MD-End and admitting. Waiting for a response back

## 2023-08-27 NOTE — Consult Note (Signed)
 Electrophysiology consultation   Patient ID: Sascha Baugher MRN: [id number]; DOB: 11/23/1955  Admit date: 08/27/2023 Date of Consult: 08/27/2023  PCP:  [name], DO   History of Present Illness:   [name] is a 68 year old man who I am seeing today for an evaluation of ICD therapies at the request of Dr. Florentina Addison.  The patient is known to Dr. Ladona Ridgel from the outpatient setting.  He has a history of persistent atrial fibrillation with rapid ventricular rate, ventricular tachycardia, chronic systolic heart failure, sleep apnea, mechanical aortic valve.  He was recently seen in clinic on August 02, 2023 for ICD therapies.  Atrial fibrillation with rapid ventricular rates was thought to be the driver of his ICD therapies.  Today he reports feeling well.  He is with his wife in the emergency department.  He reports no syncope or presyncope.  He has been using his Coumadin at home and monitors his INR there.  His INRs have been supratherapeutic..  Obese   Past medical, surgical, social and family history reviewed.  ROS:  Please see the history of present illness.  All other ROS reviewed and negative.     Physical Exam/Data:   Vitals:   08/27/23 1452 08/27/23 1700 08/27/23 1745 08/27/23 1821  BP:  99/71 98/83   Pulse:  75 (!) 57   Resp:  19 (!) 22   Temp:    99 F (37.2 C)  TempSrc:    Oral  SpO2:  97% 98%   Weight: 125.6 kg     Height: 5\' 6"  (1.676 m)       General:  Well nourished, well developed, in no acute distress Cardiac: No systolic or diastolic murmur.  Warm extremities.  Trace lower extremity pitting edema.  Distended abdomen.  Tachycardic, irregularly irregular Lungs:  clear to auscultation bilaterally, no wheezing, rhonchi or rales  Psych:  Normal affect   EKG:  The EKG was personally reviewed and demonstrates: Atrial flutter with rapid ventricular rate.  Nonsustained ventricular tachycardia Telemetry:  Telemetry was personally reviewed and demonstrates:  Atrial fibrillation and flutter with rapid ventricular rates.  Intermittent nonsustained ventricular tachycardia   August 27, 2023 in person device interrogation personally reviewed. Presenting rhythm atrial fibrillation and flutter with rapid ventricular rate  lead parameters stable Increased VT1 zone to 144 bpm ATP's reviewed.  A-fib with RVR treated unsuccessfully with ATP.   Assessment and Plan:   #ICD therapies Primarily driven by atrial fibrillation with rapid ventricular rates.  He also has nonsustained VT today during my evaluation but this is clearly not his biggest issue.  We need to achieve better control of his atrial fibrillation in order to avoid unnecessary ICD therapies.  Amiodarone IV load followed by drip for the next 24 to 48 hours.  #Atrial fibrillation Persistent.  Driving inappropriate ATP/ICD therapies. His atrial fibrillation has been refractory to treatment with amiodarone.  Will need to monitor the response to IV amiodarone over the next 24 to 48 hours.  If continues to experience symptomatic, rapidly conducted atrial fibrillation, consider upgrade to a biventricular device with AV nodal ablation.  Continue INR per pharmacy.  #Chronic systolic heart failure NYHA class II-III.  Warm and slightly volume overloaded on exam.  Diuresed with 80 mg of IV Lasix today. Consider upgrade to biventricular device with AV nodal ablation as I suspect some of his cardiomyopathy may be secondary to his persistent, rapidly conducted atrial fibrillation.   Sheria Lang T. Lalla Brothers, MD, [facility], [facility] Cardiac Electrophysiology

## 2023-08-27 NOTE — Telephone Encounter (Signed)
 Multiple inappropriate therapies from ICD. Patient contacted and agreeable to go to [facility] for treatment. Aware not to drive.

## 2023-08-27 NOTE — Telephone Encounter (Signed)
 Patient's friend Selena Batten called to inform us that they are in route to the hospital and asked if we could make them aware of the patient's arrival.

## 2023-08-27 NOTE — H&P (View-Only) (Signed)
 Electrophysiology consultation   Patient ID: Roberto [name] MRN: [id number]; DOB: 11/23/1955  Admit date: 08/27/2023 Date of Consult: 08/27/2023  PCP:  [name], DO   History of Present Illness:   [name] is a 68 year old man who I am seeing today for an evaluation of ICD therapies at the request of Dr. Florentina Addison.  The patient is known to Dr. Ladona Ridgel from the outpatient setting.  He has a history of persistent atrial fibrillation with rapid ventricular rate, ventricular tachycardia, chronic systolic heart failure, sleep apnea, mechanical aortic valve.  He was recently seen in clinic on August 02, 2023 for ICD therapies.  Atrial fibrillation with rapid ventricular rates was thought to be the driver of his ICD therapies.  Today he reports feeling well.  He is with his wife in the emergency department.  He reports no syncope or presyncope.  He has been using his Coumadin at home and monitors his INR there.  His INRs have been supratherapeutic..  Obese   Past medical, surgical, social and family history reviewed.  ROS:  Please see the history of present illness.  All other ROS reviewed and negative.     Physical Exam/Data:   Vitals:   08/27/23 1452 08/27/23 1700 08/27/23 1745 08/27/23 1821  BP:  99/71 98/83   Pulse:  75 (!) 57   Resp:  19 (!) 22   Temp:    99 F (37.2 C)  TempSrc:    Oral  SpO2:  97% 98%   Weight: 125.6 kg     Height: 5\' 6"  (1.676 m)       General:  Well nourished, well developed, in no acute distress Cardiac: No systolic or diastolic murmur.  Warm extremities.  Trace lower extremity pitting edema.  Distended abdomen.  Tachycardic, irregularly irregular Lungs:  clear to auscultation bilaterally, no wheezing, rhonchi or rales  Psych:  Normal affect   EKG:  The EKG was personally reviewed and demonstrates: Atrial flutter with rapid ventricular rate.  Nonsustained ventricular tachycardia Telemetry:  Telemetry was personally reviewed and demonstrates:  Atrial fibrillation and flutter with rapid ventricular rates.  Intermittent nonsustained ventricular tachycardia   August 27, 2023 in person device interrogation personally reviewed. Presenting rhythm atrial fibrillation and flutter with rapid ventricular rate  lead parameters stable Increased VT1 zone to 144 bpm ATP's reviewed.  A-fib with RVR treated unsuccessfully with ATP.   Assessment and Plan:   #ICD therapies Primarily driven by atrial fibrillation with rapid ventricular rates.  He also has nonsustained VT today during my evaluation but this is clearly not his biggest issue.  We need to achieve better control of his atrial fibrillation in order to avoid unnecessary ICD therapies.  Amiodarone IV load followed by drip for the next 24 to 48 hours.  #Atrial fibrillation Persistent.  Driving inappropriate ATP/ICD therapies. His atrial fibrillation has been refractory to treatment with amiodarone.  Will need to monitor the response to IV amiodarone over the next 24 to 48 hours.  If continues to experience symptomatic, rapidly conducted atrial fibrillation, consider upgrade to a biventricular device with AV nodal ablation.  Continue INR per pharmacy.  #Chronic systolic heart failure NYHA class II-III.  Warm and slightly volume overloaded on exam.  Diuresed with 80 mg of IV Lasix today. Consider upgrade to biventricular device with AV nodal ablation as I suspect some of his cardiomyopathy may be secondary to his persistent, rapidly conducted atrial fibrillation.   Roberto Lang T. Lalla Brothers, MD, [facility] Cardiac Electrophysiology

## 2023-08-27 NOTE — Significant Event (Signed)
 Asked by ED to see patient for VT.  Patient asymptomatic although he says he might be feeling his device treating him  Most of the time he's in AF going at 120-130s Reviewed 12-lead ECG at 9:15 which does show VT at 130 Also the device is ATPing, per telemetry, at 10:06, I see 2 rounds of ATP with the latter converting him out of VT back into Afib.  BP soft in the 90s range.  No dizziness. For now,  will leave amiodarone at 60 (was due to transition down to 30 at 10:30pm) Added lidocaine to try to tamp down VT.  EP to follow, and consider cardioversion.  Yu-Ping Windi Toro 08/27/23 10.50pm

## 2023-08-27 NOTE — Progress Notes (Addendum)
 PHARMACY - ANTICOAGULATION CONSULT NOTE  Pharmacy Consult for warfarin Indication: atrial fibrillation, mechanical aortic valve replacement  Allergies  Allergen Reactions   Nicotine     Patches caused localized rash and hives     Patient Measurements: Height: 5\' 6"  (167.6 cm) Weight: 125.6 kg (277 lb) IBW/kg (Calculated) : 63.8   Vital Signs: Temp: 98.6 F (37 C) (02/24 1418) BP: 117/86 (02/24 1423) Pulse Rate: 64 (02/24 1418)  Labs: Recent Labs    08/25/23 1157 08/27/23 1500  HGB 13.6 13.4  HCT 42.4 43.3  PLT 327 289  LABPROT 60.0*  --   INR 6.9*  --   CREATININE 1.67* 1.56*  TROPONINIHS 61* 69*    Estimated Creatinine Clearance: 55.9 mL/min (A) (by C-G formula based on SCr of 1.56 mg/dL (H)).   Medical History: Past Medical History:  Diagnosis Date   Aneurysm (HCC)    Bicuspid aortic valve    a. s/p #27 Carbomedics mechanical valve on 03/25/2010; b. on Coumadin; c. TTE 12/17: EF 40-45%, moderately dilated LV with moderate LVH, AVR well-seated with 14 mmHg gradient, peak AV velocity 2.5 m/s, mild mitral valve thickening with mild MR, mildly dilated RV with mildly reduced contraction   Cellulitis    CHF (congestive heart failure) (HCC)    Chronic kidney disease    Chronic systolic CHF (congestive heart failure) (HCC)    a. R/LHC 03/2010 showed no significant CAD, LVEDP 31 mmHg, mean AoV gradient 34 mmHg at rest and 47 mmHg with dobutamine 20 mcg/kg/min, AVA 1.0 cm^2, RA 31, RV 68/25, PA 68/47, PCWP 38. PA sat 65%. CO 6.2 L/min (Fick) and 5.3 L/min (thermodilution)   Clotting disorder (HCC)    COPD (chronic obstructive pulmonary disease) (HCC)    H/O mechanical aortic valve replacement 03/25/2010   a. #27 Carbomedics mechanical valve   Hearing loss    High cholesterol    HTN (hypertension)    Hypercholesterolemia    Renal infarct [facility]) 2017   Multiple right renal infarcts, likely embolic.   Stage 3 chronic kidney disease (HCC)    Stroke (HCC)    TIA  (transient ischemic attack) 05/2014     Assessment: 41 YOM presenting with ICD firing, recent visit 2/22 with elevated INR of 6.9 on warfarin PTA for afib and mechanical aortic valve, original plan to hold warfarin x 2d then reduce to 4mg  daily.  INR today is 3.5, last dose 2/22 PTA, will give dose tonight given significant downtrend  Goal of Therapy:  INR 2.5-3.5 Monitor platelets by anticoagulation protocol: Yes   Plan:  Warfarin 4mg  PO x 1 Daily INR, s/s bleeding  Daylene Posey, PharmD, [facility] Clinical Pharmacist ED Pharmacist Phone # [phone] 08/27/2023 4:46 PM

## 2023-08-27 NOTE — Consult Note (Incomplete)
 ELECTROPHYSIOLOGY H&P NOTE    Patient ID: Roberto [name] MRN: [id number], DOB/AGE: April 28, 1956 68 y.o.  Admit date: 08/27/2023 Date of Consult: 08/27/2023  Primary Physician: [name], DO Primary Cardiologist: Yvonne Kendall, MD  Electrophysiologist: Dr. Ladona Ridgel   Referring Provider: Dr. Karene Fry  Patient Profile: Roberto [name] is a 68 y.o. male with a history of persistent AF with RVR, VT, chronic systolic CHF, OSA, and mechanical aortic valve who is being seen today for the evaluation of inappropriate ATP as well as NSVT at the request of Dr. Karene Fry.  HPI:  Roberto [name] is a 68 y.o. male with history above.   Seen in clinic 08/02/2023 with VT treated on device and felt to be driving by his AF. Scheduled for [facility] which he missed in setting of URI.   Presented 2/22 to [facility] in VT and therapy zones were adjusted now.   Called today feeling poorly with multiple ATP events. Presented to Hospital. Reports taking medications as directed, the caregiver had planned to give him coumadin tonight despite instructions to hold it given elevated INR.   {He/she (caps):30048} denies chest pain, palpitations, dyspnea, PND, orthopnea, nausea, vomiting, dizziness, syncope, edema, weight gain, or early satiety.   Labs Potassium4.1 (02/24 1500) Magnesium  2.3 (02/22 1157) Creatinine, ser  1.56* (02/24 1500) PLT  289 (02/24 1500) HGB  13.4 (02/24 1500) WBC 11.7* (02/24 1500) Troponin I (High Sensitivity)69* (02/24 1500).    Past Medical History:  Diagnosis Date   Aneurysm (HCC)    Bicuspid aortic valve    a. s/p #27 Carbomedics mechanical valve on 03/25/2010; b. on Coumadin; c. TTE 12/17: EF 40-45%, moderately dilated LV with moderate LVH, AVR well-seated with 14 mmHg gradient, peak AV velocity 2.5 m/s, mild mitral valve thickening with mild MR, mildly dilated RV with mildly reduced contraction   Cellulitis    CHF (congestive heart failure) (HCC)    Chronic kidney disease     Chronic systolic CHF (congestive heart failure) (HCC)    a. R/LHC 03/2010 showed no significant CAD, LVEDP 31 mmHg, mean AoV gradient 34 mmHg at rest and 47 mmHg with dobutamine 20 mcg/kg/min, AVA 1.0 cm^2, RA 31, RV 68/25, PA 68/47, PCWP 38. PA sat 65%. CO 6.2 L/min (Fick) and 5.3 L/min (thermodilution)   Clotting disorder (HCC)    COPD (chronic obstructive pulmonary disease) (HCC)    H/O mechanical aortic valve replacement 03/25/2010   a. #27 Carbomedics mechanical valve   Hearing loss    High cholesterol    HTN (hypertension)    Hypercholesterolemia    Renal infarct [facility]) 2017   Multiple right renal infarcts, likely embolic.   Stage 3 chronic kidney disease (HCC)    Stroke [facility])    TIA (transient ischemic attack) 05/2014     Surgical History:  Past Surgical History:  Procedure Laterality Date   AORTIC VALVE REPLACEMENT     AORTIC VALVE SURGERY     CARDIAC CATHETERIZATION  03/21/2010   No significant CAD. Severe aortic stenosis. Severely elevated left and right heart filling pressures.   CARDIAC SURGERY  2009   CHF   CARPAL TUNNEL RELEASE Left 2005   ICD IMPLANT N/A 06/25/2023   Procedure: ICD IMPLANT;  Surgeon: Marinus Maw, MD;  Location: [facility] INVASIVE CV LAB;  Service: Cardiovascular;  Laterality: N/A;   PACEMAKER IMPLANT     RIGHT HEART CATH AND CORONARY ANGIOGRAPHY N/A 01/28/2019   Procedure: RIGHT HEART CATH AND CORONARY ANGIOGRAPHY;  Surgeon: Yvonne Kendall, MD;  Location: [facility] INVASIVE CV LAB;  Service: Cardiovascular;  Laterality: N/A;   RIGHT HEART CATH AND CORONARY ANGIOGRAPHY N/A 06/20/2023   Procedure: RIGHT HEART CATH AND CORONARY ANGIOGRAPHY;  Surgeon: Yvonne Kendall, MD;  Location: [facility] INVASIVE CV LAB;  Service: Cardiovascular;  Laterality: N/A;   TONSILLECTOMY  1962     (Not in a hospital admission)   Inpatient Medications:   amiodarone  150 mg Intravenous Once    Allergies:  Allergies  Allergen Reactions   Nicotine     Patches caused localized  rash and hives     Family History  Problem Relation Age of Onset   Arthritis Mother    Dementia Mother    Colon cancer Mother    Arthritis Father    Diabetes Father    Stroke Father    Colon cancer Father    Heart attack Brother    Breast cancer Sister    Seizures Sister    Cancer Brother        brain   Heart disease Brother    Heart attack Brother      Physical Exam: Vitals:   08/27/23 1418 08/27/23 1423 08/27/23 1424 08/27/23 1452  BP: (!) 153/127 117/86    Pulse: 64     Resp: 18     Temp: 98.6 F (37 C)     SpO2: 96%     Weight:   118 kg 125.6 kg  Height:   5\' 6"  (1.676 m) 5\' 6"  (1.676 m)    GEN- NAD, A&O x 3, normal affect HEENT: Normocephalic, atraumatic Lungs- CTAB, Normal effort.  Heart- Regular rate and rhythm, No M/G/R.  GI- Soft, NT, ND.  Extremities- No clubbing, cyanosis, or edema   Radiology/Studies: DG Chest Port 1 View Result Date: 08/25/2023 CLINICAL DATA:  Tachycardia. EXAM: PORTABLE CHEST 1 VIEW COMPARISON:  June 26, 2023. FINDINGS: Stable cardiomegaly. Status post sternotomy. Single lead left-sided pacemaker is again noted. Both lungs are clear. The visualized skeletal structures are unremarkable. IMPRESSION: No active disease. Electronically Signed   By: Lupita Raider M.D.   On: 08/25/2023 13:33   DG Chest 2 View Result Date: 08/21/2023 CLINICAL DATA:  h/o CHF, COPD, productive cough r/o acute pulmonary edema, PNA, effusion EXAM: CHEST - 2 VIEW COMPARISON:  None Available. FINDINGS: Post median sternotomy and prosthetic cardiac valve. The heart is enlarged. Single lead left-sided pacemaker in place. Mild aortic tortuosity. Mild peribronchial thickening. No definite septal thickening. No focal airspace disease. No pleural effusion. No pneumothorax. No acute osseous findings. IMPRESSION: 1. Cardiomegaly post median sternotomy with prosthetic aortic valve. 2. Bronchial thickening.  No convincing pulmonary edema. Electronically Signed   By: Narda Rutherford M.D.   On: 08/21/2023 20:43   CUP PACEART INCLINIC DEVICE CHECK Result Date: 08/02/2023 Normal in-clinic ICD check. Thresholds, sensing, and impedance WNL or stable for patient over time. Persistent AF. Episodes of NSVT and ?VT vs dual tachycardia on 1/28, which ATP brought back to below detection. Suspect some AF RVR And some true VT given  history of same. Continue amiodarone 400 mg BID. [facility] scheduled with INR > 2.0 for 3-4 weeks. Estimated longevity 69yr68mo . Pt enrolled in remote follow-up.   EKG: on arrival showed coarse fib vs flutter with brief NSVT (personally reviewed)  TELEMETRY: AF in 110-120s with a couplet of PVCs (personally reviewed)  DEVICE HISTORY: Single Chamber Abbott ICD 06/2023 for VT and CHF   cMRI 06/25/2023 LVEF 20%, severe RAE, and moderate LAE, Severe ascending thoracic  aneurysm measuring 5.5 cm, mod TR, mild MR   [facility] 06/20/2023 Mild to moderate, non-obstructive coronary artery disease with 60% D1 stenosis and 20% mid LAD and proximal RCA lesions.  No significant change noted from prior angiogram in 2020. Moderately elevated left heart filling pressure (PCWP 30 mmHg with prominent v-waves). Severely elevated right heart filling pressure (RA 25, RVEDP 18 mmHg). Moderate pulmonary hypertension (PA 50/28, mean 35 mmHg). Moderately reduced Fick cardiac output/index (CO 4.4 L/min, CI 1.9 L/min/m^2).  Assessment/Plan:  Poorly controlled AF with RVR Transition to IV amiodarone for loading, potential [facility] as early as tomorrow with Dr. Ladona Ridgel pending lab availability and course.  May ultimately require AV nodal ablation and device upgrade, which would be very high risk if pt cannot better control his INR  VT NSVT Some therapies appear more consistent with dual tachycardia physiology, but ultimately felt to be driving by his AF  Elevated INR Previously, I have had a lengthy and difficult discussion with his caregiver re: his coumadin management. Goal is  reportedly 2.5-3.5, but had planned to give him a dose today despite 7.0 INR last night at home, as well as instructions from Dr. Ladona Ridgel to hold.   She will need further instructions from pharmacy, specifically about letting INR trend down.  AVOID VITAMIN K IF POSSIBLE as he is pending cardiversion.   Acute on chronic CHF Will give 80 mg IV lasix x 1 and follow response with gradual weight gain over past several weeks.  EF 20% on cMRI 12/23.  GDMT as tolerated. Beta blocker has previously been held with soft pressures  For questions or updates, please contact [facility] Please consult [email or web address] for contact info under Cardiology/STEMI.  Dustin Flock, [name]  08/27/2023 4:09 PM

## 2023-08-27 NOTE — ED Triage Notes (Signed)
 Pt states his ICD fired 5 times today. Pt had ICD re-programmed Saturday and it has been firing since. Pt denies chest pain, SOB, dizziness.

## 2023-08-27 NOTE — Telephone Encounter (Signed)
 Alert received from [employer] for multiples VT/ATP threapies. Patient was seen in ER and given ok for discharge by Dr. Ladona Ridgel 08/25/23. Attempted to contact patient to assess. No answer, LMTCB.  Presenting EGM shows a morphology c/w pt's normal intrinsic conduction, HR 110-120's bpm.   6 VT-1 classified episodes treated with ATP. Longest 52 sec at 1:34 pm showing VT treated with 5 bursts of ATP with successful termination, average V rate 127 bpm. Remaining EGMs c/w VT appropriately treated with 1 Burst of ATP with Type 1 and Type 2 breaks. Average V rates 142-146 bpm. Routed to clinic for review.   32 new NSVT classified episodes since 08/25/23 transmission at 12:53 pm, longest 16 sec (device defined) at 06:58 pm. EGM from 6:51 pm is c/w VT of 10 sec duration at 155 bpm. Remaining available EGMs show up to 12 sec of tachycardia c/w pt's intrinsic morphology at 125-127 bpm. 11 SVT classified episodes. EGM from 1:57 pm is c/w VT of 14 sec duration at 142 bpm. Remaining available EGMs show up to 54 sec of tachycardia c/w pt's intrinsic morphology at 125-126 bpm.

## 2023-08-27 NOTE — Telephone Encounter (Signed)
 Otilio Saber, PA in hospital was made aware.

## 2023-08-27 NOTE — ED Provider Notes (Signed)
 [place] EMERGENCY DEPARTMENT AT [facility] Provider Note   CSN: [id number] Arrival date & time: 08/27/23  1411     History  Chief Complaint  Patient presents with  . ICD firing    Roberto [name] is a 68 y.o. male.  HPI     Home Medications Prior to Admission medications   Medication Sig Start Date End Date Taking? Authorizing Provider  acetaminophen (TYLENOL) 500 MG tablet Take 1,000 mg by mouth every 6 (six) hours as needed for moderate pain (pain score 4-6).    [provider]  albuterol (PROVENTIL) (2.5 MG/3ML) 0.083% nebulizer solution USE 1 VIAL IN NEBULIZER EVERY 6 HOURS AS NEEDED FOR WHEEZING FOR SHORTNESS OF BREATH 06/07/22   [name], [name], DO  albuterol (PROVENTIL) (2.5 MG/3ML) 0.083% nebulizer solution Take 3 mLs (2.5 mg total) by nebulization every 4 (four) hours as needed for wheezing or shortness of breath. 08/21/23   Domenick Gong, MD  allopurinol (ZYLOPRIM) 300 MG tablet Take 1 tablet (300 mg total) by mouth daily. 05/16/23   [name], [name], DO  allopurinol (ZYLOPRIM) 300 MG tablet Take 300 mg by mouth daily. 07/03/23   [provider]  amiodarone (PACERONE) 200 MG tablet Take 1 tablet (200 mg total) by mouth 2 (two) times daily. 08/24/23   Graciella Freer, [name]  amoxicillin-clavulanate (AUGMENTIN) 875-125 MG tablet Take 1 tablet by mouth every 12 (twelve) hours. 08/21/23   Domenick Gong, MD  aspirin 81 MG chewable tablet Chew 81 mg by mouth daily.    [provider]  buPROPion (WELLBUTRIN SR) 150 MG 12 hr tablet Take 150 mg by mouth 2 (two) times daily. 07/27/23   [provider]  buPROPion (WELLBUTRIN SR) 150 MG 12 hr tablet Take 150 mg by mouth 2 (two) times daily. 07/27/23   [provider]  buPROPion (WELLBUTRIN XL) 300 MG 24 hr tablet Take 1 tablet (300 mg total) by mouth daily. Patient not taking: Reported on 08/08/2023 07/27/23 07/26/24  Jackolyn Confer, MD  buPROPion (WELLBUTRIN XL) 300  MG 24 hr tablet Take 300 mg by mouth daily. 08/16/23   [provider]  clotrimazole (MYCELEX) 10 MG troche Take 10 mg by mouth 5 (five) times daily. 07/27/23   [provider]  dapagliflozin propanediol (FARXIGA) 10 MG TABS tablet Take 1 tablet (10 mg total) by mouth daily before breakfast. 07/05/23   Clarisa Kindred A, FNP  FARXIGA 10 MG TABS tablet Take 10 mg by mouth daily. 05/30/23   [provider]  fenofibrate (TRICOR) 48 MG tablet Take 1 tablet (48 mg total) by mouth daily. 12/25/22   End, Cristal Deer, MD  fenofibrate (TRICOR) 48 MG tablet Take 48 mg by mouth daily. 06/08/23   [provider]  furosemide (LASIX) 80 MG tablet Take 80 mg by mouth daily. 05/17/23   [provider]  KLOR-CON M20 20 MEQ tablet Take 20 mEq by mouth daily. 03/30/23   [provider]  lisinopril (ZESTRIL) 2.5 MG tablet Take 2.5 mg by mouth daily. 06/25/23   [provider]  losartan (COZAAR) 25 MG tablet Take 0.5 tablets (12.5 mg total) by mouth at bedtime. 08/01/23   Sabharwal, Aditya, DO  losartan (COZAAR) 25 MG tablet Take 12.5 mg by mouth at bedtime. 08/01/23   [provider]  metFORMIN (GLUCOPHAGE-XR) 500 MG 24 hr tablet Take 1 tablet (500 mg total) by mouth daily with breakfast. 05/16/23   Laural Benes, [name], DO  metFORMIN (GLUCOPHAGE-XR) 500 MG 24  hr tablet Take 500 mg by mouth every morning. 06/29/23   [provider]  nicotine (NICODERM CQ - DOSED IN MG/24 HR) 7 mg/24hr patch 7 mg daily. 07/23/23   [provider]  nortriptyline (PAMELOR) 10 MG capsule Take 1 capsule (10 mg total) by mouth at bedtime. 05/16/23   [name], [name], DO  nortriptyline (PAMELOR) 10 MG capsule Take 10 mg by mouth at bedtime. 08/12/23   [provider]  Omega-3 Fatty Acids (OMEGA 3 PO) Take 1,500 mg by mouth 2 (two) times daily.    [provider]  OZEMPIC, 1 MG/DOSE, 4 MG/3ML SOPN Inject 1 mg into the skin once a week. 07/23/23   [provider]  potassium chloride SA (KLOR-CON M) 20 MEQ tablet Take 20 mEq by mouth daily.    [provider]  rosuvastatin (CRESTOR) 40 MG tablet Take 1 tablet (40 mg total) by mouth daily. 04/06/23   End, Cristal Deer, MD  rosuvastatin (CRESTOR) 40 MG tablet Take 40 mg by mouth daily. 03/25/23   [provider]  Semaglutide, 1 MG/DOSE, 4 MG/3ML SOPN Inject 1 mg as directed once a week. 05/16/23   [name], DO  Spacer/Aero-Holding Chambers (AEROCHAMBER MV) inhaler Use as instructed 08/21/23   Domenick Gong, MD  Spacer/Aero-Holding Chambers [facility] SPACE CHAMBER ANTI-STATIC) DEVI See admin instructions. 07/13/23   [provider]  Spacer/Aero-Holding Rudean Curt Use as directed Dx: COPD, J44.9 07/13/23   Olevia Perches P, DO  spironolactone (ALDACTONE) 25 MG tablet Take 0.5 tablets (12.5 mg total) by mouth daily. 07/12/23   End, Cristal Deer, MD  spironolactone (ALDACTONE) 25 MG tablet Take 12.5 mg by mouth daily. 07/12/23   [provider]  torsemide (DEMADEX) 20 MG tablet Take 1 tablet (20 mg total) by mouth daily. Patient taking differently: Take 20 mg by mouth 2 (two) times daily. 07/13/23   End, Cristal Deer, MD  torsemide (DEMADEX) 20 MG tablet Take 40 mg by mouth daily. 08/05/23   [provider]  TRELEGY ELLIPTA 100-62.5-25 MCG/ACT AEPB Inhale 1 puff by mouth once daily Patient taking differently: Inhale 1 puff into the lungs daily as needed (shortness of breaths). 05/16/23   [name], [name], DO  TRELEGY ELLIPTA 100-62.5-25 MCG/ACT AEPB Inhale 1 puff into the lungs daily. 06/05/23   [provider]  VENTOLIN HFA 108 (90 Base) MCG/ACT inhaler INHALE 2 PUFFS BY MOUTH EVERY 6 HOURS AS NEEDED FOR WHEEZING OR SHORTNESS OF BREATH 05/16/23   [name], [name], DO  VENTOLIN HFA 108 (90 Base) MCG/ACT inhaler SMARTSIG:2 Puff(s) By Mouth Every 6 Hours PRN 07/04/23   [provider]  warfarin (COUMADIN) 4 MG tablet TAKE 1 TABLET BY MOUTH ONCE  DAILY AT  6PM  AS  DIRECTED Patient taking differently: TAKE 4MG  BY MOUTH FOR 2 DAYS, THEN TAKE 7.5MG  ON THE THIRD DAY, THEN REPEAT 06/27/23   Lee, [place], NP  warfarin (COUMADIN) 4 MG tablet SMARTSIG:1 Tablet(s) By Mouth Every Evening 08/15/23   [provider]  warfarin (COUMADIN) 7.5 MG tablet TAKE 4MG  BY MOUTH FOR 2 DAYS, THEN TAKE 7.5MG  ON THE THIRD DAY, THEN REPEAT 06/27/23   Lee, [place], NP      Allergies    Nicotine    Review of Systems   Review of Systems  Physical Exam Updated Vital Signs BP 117/86   Pulse 64   Temp 98.6 F (37 C)   Resp 18   Ht 5\' 6"  (1.676 m)   Wt 125.6 kg  SpO2 96%   BMI 44.71 kg/m  Physical Exam  ED Results / Procedures / Treatments   Labs (all labs ordered are listed, but only abnormal results are displayed) Labs Reviewed  BASIC METABOLIC PANEL  CBC  TROPONIN I (HIGH SENSITIVITY)    EKG None  Radiology No results found.  Procedures Procedures  {Document cardiac monitor, telemetry assessment procedure when appropriate:1}  Medications Ordered in ED Medications - No data to display  ED Course/ Medical Decision Making/ A&P   {   Click here for ABCD2, HEART and other calculatorsREFRESH Note before signing :1}                              Medical Decision Making Amount and/or Complexity of Data Reviewed Labs: ordered. Radiology: ordered.  Risk Decision regarding hospitalization.   ***  {Document critical care time when appropriate:1} {Document review of labs and clinical decision tools ie heart score, Chads2Vasc2 etc:1}  {Document your independent review of radiology images, and any outside records:1} {Document your discussion with family members, caretakers, and with consultants:1} {Document social determinants of health affecting pt's care:1} {Document your decision making why or why not admission, treatments were needed:1} Final Clinical Impression(s) / ED Diagnoses Final diagnoses:  None    Rx / DC  Orders ED Discharge Orders     None

## 2023-08-28 ENCOUNTER — Encounter (HOSPITAL_COMMUNITY): Payer: Self-pay | Admitting: Cardiology

## 2023-08-28 ENCOUNTER — Observation Stay (HOSPITAL_COMMUNITY): Payer: 59 | Admitting: Anesthesiology

## 2023-08-28 ENCOUNTER — Encounter (HOSPITAL_COMMUNITY)
Admission: EM | Disposition: A | Discharge status: Home / Self Care | Payer: Self-pay | Source: Home / Self Care | Attending: Internal Medicine

## 2023-08-28 DIAGNOSIS — E7800 Pure hypercholesterolemia, unspecified: Secondary | ICD-10-CM | POA: Diagnosis present

## 2023-08-28 DIAGNOSIS — J449 Chronic obstructive pulmonary disease, unspecified: Secondary | ICD-10-CM | POA: Diagnosis present

## 2023-08-28 DIAGNOSIS — I110 Hypertensive heart disease with heart failure: Secondary | ICD-10-CM | POA: Diagnosis not present

## 2023-08-28 DIAGNOSIS — I429 Cardiomyopathy, unspecified: Secondary | ICD-10-CM | POA: Diagnosis present

## 2023-08-28 DIAGNOSIS — Z952 Presence of prosthetic heart valve: Secondary | ICD-10-CM | POA: Diagnosis not present

## 2023-08-28 DIAGNOSIS — Z7984 Long term (current) use of oral hypoglycemic drugs: Secondary | ICD-10-CM | POA: Diagnosis not present

## 2023-08-28 DIAGNOSIS — N1831 Chronic kidney disease, stage 3a: Secondary | ICD-10-CM | POA: Diagnosis present

## 2023-08-28 DIAGNOSIS — I472 Ventricular tachycardia, unspecified: Secondary | ICD-10-CM | POA: Diagnosis present

## 2023-08-28 DIAGNOSIS — I4892 Unspecified atrial flutter: Secondary | ICD-10-CM

## 2023-08-28 DIAGNOSIS — H9190 Unspecified hearing loss, unspecified ear: Secondary | ICD-10-CM | POA: Diagnosis present

## 2023-08-28 DIAGNOSIS — Z6841 Body Mass Index (BMI) 40.0 and over, adult: Secondary | ICD-10-CM | POA: Diagnosis not present

## 2023-08-28 DIAGNOSIS — F17210 Nicotine dependence, cigarettes, uncomplicated: Secondary | ICD-10-CM | POA: Diagnosis present

## 2023-08-28 DIAGNOSIS — I4891 Unspecified atrial fibrillation: Secondary | ICD-10-CM | POA: Diagnosis present

## 2023-08-28 DIAGNOSIS — I509 Heart failure, unspecified: Secondary | ICD-10-CM

## 2023-08-28 DIAGNOSIS — I130 Hypertensive heart and chronic kidney disease with heart failure and stage 1 through stage 4 chronic kidney disease, or unspecified chronic kidney disease: Secondary | ICD-10-CM | POA: Diagnosis present

## 2023-08-28 DIAGNOSIS — Z79899 Other long term (current) drug therapy: Secondary | ICD-10-CM | POA: Diagnosis not present

## 2023-08-28 DIAGNOSIS — D689 Coagulation defect, unspecified: Secondary | ICD-10-CM | POA: Diagnosis present

## 2023-08-28 DIAGNOSIS — Z7901 Long term (current) use of anticoagulants: Secondary | ICD-10-CM | POA: Diagnosis not present

## 2023-08-28 DIAGNOSIS — Z833 Family history of diabetes mellitus: Secondary | ICD-10-CM | POA: Diagnosis not present

## 2023-08-28 DIAGNOSIS — Z8249 Family history of ischemic heart disease and other diseases of the circulatory system: Secondary | ICD-10-CM | POA: Diagnosis not present

## 2023-08-28 DIAGNOSIS — I5022 Chronic systolic (congestive) heart failure: Secondary | ICD-10-CM | POA: Diagnosis not present

## 2023-08-28 DIAGNOSIS — J069 Acute upper respiratory infection, unspecified: Secondary | ICD-10-CM | POA: Diagnosis present

## 2023-08-28 DIAGNOSIS — E66813 Obesity, class 3: Secondary | ICD-10-CM | POA: Diagnosis present

## 2023-08-28 DIAGNOSIS — Z95810 Presence of automatic (implantable) cardiac defibrillator: Secondary | ICD-10-CM | POA: Diagnosis not present

## 2023-08-28 DIAGNOSIS — I4819 Other persistent atrial fibrillation: Secondary | ICD-10-CM | POA: Diagnosis present

## 2023-08-28 DIAGNOSIS — Z7985 Long-term (current) use of injectable non-insulin antidiabetic drugs: Secondary | ICD-10-CM | POA: Diagnosis not present

## 2023-08-28 DIAGNOSIS — I5023 Acute on chronic systolic (congestive) heart failure: Secondary | ICD-10-CM | POA: Diagnosis present

## 2023-08-28 HISTORY — PX: CARDIOVERSION: EP1203

## 2023-08-28 LAB — CBC
HCT: 42.4 % (ref 39.0–52.0)
Hemoglobin: 13.3 g/dL (ref 13.0–17.0)
MCH: 27.9 pg (ref 26.0–34.0)
MCHC: 31.4 g/dL (ref 30.0–36.0)
MCV: 88.9 fL (ref 80.0–100.0)
Platelets: 280 10*3/uL (ref 150–400)
RBC: 4.77 MIL/uL (ref 4.22–5.81)
RDW: 17.1 % — ABNORMAL HIGH (ref 11.5–15.5)
WBC: 11.6 10*3/uL — ABNORMAL HIGH (ref 4.0–10.5)
nRBC: 0 % (ref 0.0–0.2)

## 2023-08-28 LAB — BASIC METABOLIC PANEL
Anion gap: 12 (ref 5–15)
BUN: 24 mg/dL — ABNORMAL HIGH (ref 8–23)
CO2: 23 mmol/L (ref 22–32)
Calcium: 9.1 mg/dL (ref 8.9–10.3)
Chloride: 104 mmol/L (ref 98–111)
Creatinine, Ser: 1.55 mg/dL — ABNORMAL HIGH (ref 0.61–1.24)
GFR, Estimated: 48 mL/min — ABNORMAL LOW (ref >60)
Glucose, Bld: 130 mg/dL — ABNORMAL HIGH (ref 70–99)
Potassium: 3.8 mmol/L (ref 3.5–5.1)
Sodium: 139 mmol/L (ref 135–145)

## 2023-08-28 LAB — PROTIME-INR
INR: 2.5 — ABNORMAL HIGH (ref 0.8–1.2)
Prothrombin Time: 27.2 s — ABNORMAL HIGH (ref 11.4–15.2)

## 2023-08-28 SURGERY — CARDIOVERSION (CATH LAB)
Anesthesia: General

## 2023-08-28 MED ORDER — PROPOFOL 10 MG/ML IV BOLUS
INTRAVENOUS | Status: DC | PRN
  Administered 2023-08-28: 40 mg via INTRAVENOUS

## 2023-08-28 MED ORDER — POTASSIUM CHLORIDE CRYS ER 20 MEQ PO TBCR: 40.0000 meq | EXTENDED_RELEASE_TABLET | Freq: Once | ORAL | Status: DC

## 2023-08-28 MED ORDER — SODIUM CHLORIDE 0.9 % IV SOLN: INTRAVENOUS | Status: DC

## 2023-08-28 MED ORDER — AMIODARONE HCL IN DEXTROSE 360-4.14 MG/200ML-% IV SOLN
INTRAVENOUS | Status: AC
  Filled 2023-08-28: qty 200

## 2023-08-28 MED ORDER — FUROSEMIDE 10 MG/ML IJ SOLN: 80.0000 mg | Freq: Once | INTRAMUSCULAR | Status: DC

## 2023-08-28 MED ORDER — LIDOCAINE 2% (20 MG/ML) 5 ML SYRINGE
INTRAMUSCULAR | Status: DC | PRN
  Administered 2023-08-28: 20 mg via INTRAVENOUS

## 2023-08-28 MED ORDER — WARFARIN SODIUM 7.5 MG PO TABS
7.5000 mg | ORAL_TABLET | Freq: Once | ORAL | Status: AC
  Administered 2023-08-28: 7.5 mg via ORAL
  Filled 2023-08-28: qty 1

## 2023-08-28 SURGICAL SUPPLY — 1 items: PAD DEFIB RADIO PHYSIO CONN (PAD) ×1

## 2023-08-28 NOTE — Progress Notes (Signed)
 Transition of Care [facility]) - Inpatient Brief Assessment   Patient Details  Name: Roberto [name] MRN: [id number] Date of Birth: 01-31-1956  Transition of Care [facility]) CM/SW Contact:    Oletta Cohn, RN Phone Number: 08/28/2023, 8:59 AM   Clinical Narrative: RNCM met with pt at bedside regarding discharge planning.  Pt plans to return home with self care.   Transition of Care Asessment: Insurance and Status: (P) Insurance coverage has been reviewed Patient has primary care physician: (P) Yes Home environment has been reviewed: (P) from home with significant other Prior level of function:: (P) independent (uses walker when he has gout) Prior/Current Home Services: (P) No current home services Social Drivers of Health Review: (P) SDOH reviewed no interventions necessary Readmission risk has been reviewed: (P) Yes Transition of care needs: (P) no transition of care needs at this time

## 2023-08-28 NOTE — Transfer of Care (Signed)
 Immediate Anesthesia Transfer of Care Note  Patient: Roberto [name]  Procedure(s) Performed: CARDIOVERSION  Patient Location: PACU  Anesthesia Type:MAC  Level of Consciousness: sedated  Airway & Oxygen Therapy: Patient Spontanous Breathing  Post-op Assessment: Report given to RN  Post vital signs: Reviewed and stable  Last Vitals:  Vitals Value Taken Time  BP 117/80   Temp    Pulse 75   Resp 21   SpO2 94     Last Pain:  Vitals:   08/28/23 0832  TempSrc: Temporal  PainSc:          Complications: No notable events documented.

## 2023-08-28 NOTE — ED Notes (Signed)
 Per Cardiology interrogate patient's pacemaker. RN interrogated and secretary will bring results to yellow zone to send with patient.

## 2023-08-28 NOTE — Progress Notes (Signed)
 Paged again by the emergency room reporting that patient had another episode of VT lasting around 6 minutes, asymptomatic and stable.  Lidocaine was added by overnight provider and patient on IV amiodarone 60 mg continuous infusion.  Will also order stat BMP, CBC.  Magnesium normal yesterday.  Will ensure that EP team is notified this a.m for further recommendations.  Also asked the ED to interrogate his device.

## 2023-08-28 NOTE — Progress Notes (Addendum)
 PHARMACY - ANTICOAGULATION CONSULT NOTE  Pharmacy Consult for warfarin Indication: atrial fibrillation, mechanical aortic valve replacement  Allergies  Allergen Reactions   Nicotine     Patches caused localized rash and hives     Patient Measurements: Height: 5\' 6"  (167.6 cm) Weight: 125.6 kg (277 lb) IBW/kg (Calculated) : 63.8   Vital Signs: Temp: 97.9 F (36.6 C) (02/25 0832) Temp Source: Temporal (02/25 0832) BP: 112/83 (02/25 0832) Pulse Rate: 107 (02/25 0835)  Labs: Recent Labs    08/25/23 1157 08/27/23 1500 08/27/23 1747 08/28/23 0244 08/28/23 0627  HGB 13.6 13.4  --   --  13.3  HCT 42.4 43.3  --   --  42.4  PLT 327 289  --   --  280  LABPROT 60.0*  --  35.3* 27.2*  --   INR 6.9*  --  3.5* 2.5*  --   CREATININE 1.67* 1.56*  --   --  1.55*  TROPONINIHS 61* 69* 71*  --   --     Estimated Creatinine Clearance: 56.3 mL/min (A) (by C-G formula based on SCr of 1.55 mg/dL (H)).   Medical History: Past Medical History:  Diagnosis Date   Aneurysm (HCC)    Bicuspid aortic valve    a. s/p #27 Carbomedics mechanical valve on 03/25/2010; b. on Coumadin; c. TTE 12/17: EF 40-45%, moderately dilated LV with moderate LVH, AVR well-seated with 14 mmHg gradient, peak AV velocity 2.5 m/s, mild mitral valve thickening with mild MR, mildly dilated RV with mildly reduced contraction   Cellulitis    CHF (congestive heart failure) (HCC)    Chronic kidney disease    Chronic systolic CHF (congestive heart failure) (HCC)    a. R/LHC 03/2010 showed no significant CAD, LVEDP 31 mmHg, mean AoV gradient 34 mmHg at rest and 47 mmHg with dobutamine 20 mcg/kg/min, AVA 1.0 cm^2, RA 31, RV 68/25, PA 68/47, PCWP 38. PA sat 65%. CO 6.2 L/min (Fick) and 5.3 L/min (thermodilution)   Clotting disorder (HCC)    COPD (chronic obstructive pulmonary disease) (HCC)    H/O mechanical aortic valve replacement 03/25/2010   a. #27 Carbomedics mechanical valve   Hearing loss    High cholesterol    HTN  (hypertension)    Hypercholesterolemia    Renal infarct [facility]) 2017   Multiple right renal infarcts, likely embolic.   Stage 3 chronic kidney disease (HCC)    Stroke (HCC)    TIA (transient ischemic attack) 05/2014     Assessment: 59 YOM presenting with ICD firing, recent visit 2/22 with elevated INR of 6.9 on warfarin PTA for afib and mechanical aortic valve, original plan to hold warfarin x 2d then reduce to 4mg  daily x2 days then 7.5mg  daily x 1 day then repeat this regimen (4mg  x 2 days then 7.5mg  x 1 day).  INR 2/24 pm was 3.5, warfarin 4mg  ordered to give but charted as "hold" with comment that pt states not supposed to receive prior to DCCV per cardiologist. This is not accurate and dose should have been given since INR down to 3.5.  INR today down to 2.5 - pt getting DCCV today.  Goal of Therapy:  INR 2.5-3.5 Monitor platelets by anticoagulation protocol: Yes   Plan:  Warfarin 7.5 mg PO x 1 Daily INR, s/s bleeding Also will see if caregiver can bring in INR machine as if it is was reading 5 but in ED his INR was 3.5 then maybe the home machine is not working properly.  Christoper Fabian, PharmD, BCPS Please see amion for complete clinical pharmacist phone list 08/28/2023 8:50 AM

## 2023-08-28 NOTE — Anesthesia Postprocedure Evaluation (Signed)
 Anesthesia Post Note  Patient: Roberto [name]  Procedure(s) Performed: CARDIOVERSION     Patient location during evaluation: PACU Anesthesia Type: General Level of consciousness: awake and alert Pain management: pain level controlled Vital Signs Assessment: post-procedure vital signs reviewed and stable Respiratory status: spontaneous breathing, nonlabored ventilation, respiratory function stable and patient connected to nasal cannula oxygen Cardiovascular status: blood pressure returned to baseline and stable Postop Assessment: no apparent nausea or vomiting Anesthetic complications: no  No notable events documented.  Last Vitals:  Vitals:   08/28/23 0950 08/28/23 1000  BP: 108/82 (!) 87/63  Pulse: 76 75  Resp: 20 20  Temp:    SpO2: 90% 90%    Last Pain:  Vitals:   08/28/23 0930  TempSrc:   PainSc: 0-No pain                 Shelton Silvas

## 2023-08-28 NOTE — CV Procedure (Signed)
 Procedure:   DCCV  Indication:  Symptomatic atrial fibrillation/flutter  Procedure Note:  The patient signed informed consent.  They have had had therapeutic anticoagulation with coumadin greater than 3 weeks.  Anesthesia was administered by Dr. Hart Rochester.  Patient received 20 mg IV lidocaine and 40 mg IV propofol.Adequate airway was maintained throughout and vital followed per protocol.  They were cardioverted x 1 with 200J of biphasic synchronized energy.  They converted initially to sinus rhythm, then held what appeared to be an accelerated junctional vs. Ectopic atrial (short PR) rhythm in the 70s. Device was interrogated and managed throughout by [employer].  There were no apparent complications.  The patient had normal neuro status and respiratory status post procedure with vitals stable as recorded elsewhere.    Follow up:  They will continue on current medical therapy and follow up with cardiology as scheduled.  Jodelle Red, MD PhD 08/28/2023 9:28 AM

## 2023-08-28 NOTE — Progress Notes (Addendum)
  Patient Name: Roberto [name] Date of Encounter: 08/28/2023  Primary Cardiologist: Yvonne Kendall, MD Electrophysiologist: Lewayne Bunting, MD  Interval Summary   Feeling OK this am. Relatively asymptomatic with slow VT earlier this am.   Vital Signs    Vitals:   08/28/23 0602 08/28/23 0613 08/28/23 0614 08/28/23 0616  BP: 98/72  101/81   Pulse: (!) 115 (!) 103 (!) 122 (!) 115  Resp: (!) 21 (!) 21 18 (!) 21  Temp: 97.8 F (36.6 C)     TempSrc: Oral     SpO2: 93% 93% 96% 98%  Weight:      Height:        Intake/Output Summary (Last 24 hours) at 08/28/2023 0819 Last data filed at 08/28/2023 0428 Gross per 24 hour  Intake --  Output 2300 ml  Net -2300 ml   Filed Weights   08/27/23 1424 08/27/23 1452  Weight: 118 kg 125.6 kg    Physical Exam    GEN- The patient is well appearing, alert and oriented x 3 today.   Lungs- Clear to ausculation bilaterally, normal work of breathing Cardiac- Irregularly irregular rate and rhythm, no murmurs, rubs or gallops GI- soft, NT, ND, + BS Extremities- no clubbing or cyanosis. No edema  Telemetry    AF with RVR 100-120s with occasional NSVT, 1 episode ~ 120 bpm for ~ 5-6 minutes (personally reviewed)  Hospital Course    Roberto [name] is a 68 y.o. male with a history of persistent AF with RVR, VT, chronic systolic CHF, OSA, and mechanical aortic valve who is being seen today for the evaluation of inappropriate ATP as well as NSVT at the request of Dr. Karene Fry.   Assessment & Plan    Poorly controlled AF with RVR Continue IV amiodarone.  Plan for [facility] this am.  Continue coumadin.  INR 2.5 this am. (Goal 2.5 - 3.5) Follow closely. Given mechanical MV If becomes a recurring issue, may need to consider BIV upgrade with AV nodal ablation in the future.    VT NSVT Sustained VT ~ 120 this am. Will re-adjust zones prior to discharge with MD discussion Some therapies appear more consistent with dual tachycardia physiology, but  ultimately felt to be driving by his AF   Elevated INR Previously, I have had a lengthy and difficult discussion with his caregiver re: his coumadin management. Goal is reportedly 2.5-3.5, but had planned to give him a dose today despite 7.0 INR last night at home, as well as instructions from Dr. Ladona Ridgel to hold.   She will need further instructions from pharmacy, specifically about letting INR trend down.    Acute on chronic CHF Will repeat IV lasix post [facility].  EF 20% on cMRI 12/23.  GDMT as tolerated. Beta blocker has previously been held with soft pressures  Mechanical MV Continue coumadin as above.   For questions or updates, please contact [facility] Please consult [email or web address] for contact info under Cardiology/STEMI.  Signed, Graciella Freer, [name]  08/28/2023, 8:19 AM   EP Attending  Patient seen and examined. Agree with above. The patient is doing well after undergoing DCCV earlier. He is maintaining NSR. Lungs are clear. Tele with NSR at 75/min A/P Persistent atrial fib - he has been cardioverted back to NSR. Continue.  VT - he has been cardioverted back to NSR. Continue amio. Chronic systolic heart failure -his symptoms are improved back in NSR. ICD - his device has been reprogrammed under my direction.  Roberto Gowda [name],MD

## 2023-08-28 NOTE — Plan of Care (Signed)
   Problem: Education: Goal: Knowledge of disease or condition will improve Outcome: Progressing Goal: Understanding of medication regimen will improve Outcome: Progressing

## 2023-08-28 NOTE — Telephone Encounter (Signed)
 Unable to refill per protocol, Rx expired. Discontinued dose change.  Requested Prescriptions  Pending Prescriptions Disp Refills   buPROPion (WELLBUTRIN SR) 150 MG 12 hr tablet [Pharmacy Med Name: buPROPion HCl ER (SR) 150 MG Oral Tablet Extended Release 12 Hour] 60 tablet 0    Sig: Take 1 tablet by mouth twice daily     Psychiatry: Antidepressants - bupropion Failed - 08/28/2023  8:23 AM      Failed - Cr in normal range and within 360 days    Creatinine  Date Value Ref Range Status  05/25/2014 0.85 0.60 - 1.30 mg/dL Final   Creatinine, Ser  Date Value Ref Range Status  08/28/2023 1.55 (H) 0.61 - 1.24 mg/dL Final         Failed - AST in normal range and within 360 days    AST  Date Value Ref Range Status  08/25/2023 50 (H) 15 - 41 U/L Final   SGOT(AST)  Date Value Ref Range Status  05/24/2014 22 15 - 37 Unit/L Final   AST (SGOT) Piccolo, Waived  Date Value Ref Range Status  05/17/2017 31 11 - 38 U/L Final         Failed - ALT in normal range and within 360 days    ALT  Date Value Ref Range Status  08/25/2023 46 (H) 0 - 44 U/L Final   SGPT (ALT)  Date Value Ref Range Status  05/24/2014 20 U/L Final    Comment:    14-63 NOTE: New Reference Range 01/20/14    ALT (SGPT) Piccolo, Waived  Date Value Ref Range Status  05/17/2017 47 10 - 47 U/L Final         Passed - Completed PHQ-2 or PHQ-9 in the last 360 days      Passed - Last BP in normal range    BP Readings from Last 1 Encounters:  08/28/23 101/81         Passed - Valid encounter within last 6 months    Recent Outpatient Visits           1 month ago Upper respiratory tract infection, unspecified type   [place] [facility] Jackolyn Confer, MD   3 months ago Routine general medical examination at a health care facility   [facility], [name], DO   4 months ago Tobacco abuse   [facility] [place], [name], DO   6 months ago  Anticoagulated on Coumadin   [place] [facility] [place], Sherran Needs, NP   6 months ago Anticoagulated on Coumadin   [facility], Sherran Needs, NP       Future Appointments             In 1 week Sheilah Pigeon, [name] [facility], [facility]   In 1 month [place], Cristal Deer, MD [facility] [place]   In 2 months [name], DO [facility] [facility]

## 2023-08-28 NOTE — Plan of Care (Signed)
  Problem: Education: Goal: Knowledge of disease or condition will improve Outcome: Progressing Goal: Understanding of medication regimen will improve Outcome: Progressing Goal: Individualized Educational Video(s) Outcome: Progressing   

## 2023-08-28 NOTE — Anesthesia Preprocedure Evaluation (Addendum)
 Anesthesia Evaluation  Patient identified by MRN, date of birth, ID band Patient awake    Reviewed: Allergy & Precautions, NPO status , Patient's Chart, lab work & pertinent test results  Airway Mallampati: III  TM Distance: >3 FB Neck ROM: Full    Dental  (+) Edentulous Upper, Edentulous Lower   Pulmonary COPD,  COPD inhaler, Current Smoker   breath sounds clear to auscultation       Cardiovascular hypertension, Pt. on medications + CAD and +CHF   Rhythm:Irregular Rate:Abnormal  Echo:   1. Left ventricular ejection fraction, by estimation, is 20 to 25%. The  left ventricle has severely decreased function. The left ventricle  demonstrates global hypokinesis. The left ventricular internal cavity size  was mildly dilated. There is mild left  ventricular hypertrophy. Left ventricular diastolic parameters are  indeterminate.   2. Right ventricular systolic function is moderately reduced. The right  ventricular size is normal. There is mildly elevated pulmonary artery  systolic pressure. The estimated right ventricular systolic pressure is  39.6 mmHg.   3. Left atrial size was moderately dilated.   4. The mitral valve is normal in structure. Mild mitral valve  regurgitation. No evidence of mitral stenosis.   5. Tricuspid valve regurgitation is moderate to severe.   6. The aortic valve has been repaired/replaced. There is a 27 mm  Carbometrics mechanical valve present. Aortic valve regurgitation is not  visualized. No aortic stenosis is present. Aortic valve mean gradient  measures 8.0 mmHg.   7. There is mild dilatation of the aortic arch, measuring 43 mm.   8. The inferior vena cava is dilated in size with <50% respiratory  variability, suggesting right atrial pressure of 15 mmHg.     Neuro/Psych  PSYCHIATRIC DISORDERS  Depression    TIACVA    GI/Hepatic negative GI ROS, Neg liver ROS,,,  Endo/Other  diabetes, Type 2, Oral  Hypoglycemic Agents    Renal/GU Renal disease     Musculoskeletal  (+) Arthritis ,    Abdominal   Peds  Hematology negative hematology ROS (+)   Anesthesia Other Findings   Reproductive/Obstetrics                             Anesthesia Physical Anesthesia Plan  ASA: 4  Anesthesia Plan: General   Post-op Pain Management: Minimal or no pain anticipated   Induction: Intravenous  PONV Risk Score and Plan: 0 and Propofol infusion  Airway Management Planned: Natural Airway and Nasal Cannula  Additional Equipment: None  Intra-op Plan:   Post-operative Plan:   Informed Consent: I have reviewed the patients History and Physical, chart, labs and discussed the procedure including the risks, benefits and alternatives for the proposed anesthesia with the patient or authorized representative who has indicated his/her understanding and acceptance.       Plan Discussed with: CRNA  Anesthesia Plan Comments:        Anesthesia Quick Evaluation

## 2023-08-28 NOTE — Interval H&P Note (Signed)
 History and Physical Interval Note:  08/28/2023 9:05 AM  Roberto [name]  has presented today for surgery, with the diagnosis of afbi.  The various methods of treatment have been discussed with the patient and family. After consideration of risks, benefits and other options for treatment, the patient has consented to  Procedure(s): CARDIOVERSION (N/A) as a surgical intervention.  The patient's history has been reviewed, patient examined, no change in status, stable for surgery.  I have reviewed the patient's chart and labs.  Questions were answered to the patient's satisfaction.     [name]

## 2023-08-29 ENCOUNTER — Telehealth (HOSPITAL_COMMUNITY): Payer: Self-pay | Admitting: Pharmacy Technician

## 2023-08-29 ENCOUNTER — Other Ambulatory Visit (HOSPITAL_COMMUNITY): Payer: Self-pay

## 2023-08-29 LAB — CBC
HCT: 40.6 % (ref 39.0–52.0)
Hemoglobin: 12.6 g/dL — ABNORMAL LOW (ref 13.0–17.0)
MCH: 27.6 pg (ref 26.0–34.0)
MCHC: 31 g/dL (ref 30.0–36.0)
MCV: 89 fL (ref 80.0–100.0)
Platelets: 223 10*3/uL (ref 150–400)
RBC: 4.56 MIL/uL (ref 4.22–5.81)
RDW: 17.2 % — ABNORMAL HIGH (ref 11.5–15.5)
WBC: 10 10*3/uL (ref 4.0–10.5)
nRBC: 0 % (ref 0.0–0.2)

## 2023-08-29 LAB — BASIC METABOLIC PANEL
Anion gap: 11 (ref 5–15)
BUN: 22 mg/dL (ref 8–23)
CO2: 24 mmol/L (ref 22–32)
Calcium: 8.9 mg/dL (ref 8.9–10.3)
Chloride: 104 mmol/L (ref 98–111)
Creatinine, Ser: 1.4 mg/dL — ABNORMAL HIGH (ref 0.61–1.24)
GFR, Estimated: 54 mL/min — ABNORMAL LOW (ref >60)
Glucose, Bld: 105 mg/dL — ABNORMAL HIGH (ref 70–99)
Potassium: 3.5 mmol/L (ref 3.5–5.1)
Sodium: 139 mmol/L (ref 135–145)

## 2023-08-29 LAB — PROTIME-INR
INR: 2 — ABNORMAL HIGH (ref 0.8–1.2)
Prothrombin Time: 22.7 s — ABNORMAL HIGH (ref 11.4–15.2)

## 2023-08-29 MED ORDER — WARFARIN SODIUM 7.5 MG PO TABS
7.5000 mg | ORAL_TABLET | Freq: Once | ORAL | Status: AC
  Administered 2023-08-29: 7.5 mg via ORAL
  Filled 2023-08-29: qty 1

## 2023-08-29 MED ORDER — AMIODARONE HCL 200 MG PO TABS
400.0000 mg | ORAL_TABLET | Freq: Two times a day (BID) | ORAL | Status: DC
  Administered 2023-08-30: 400 mg via ORAL
  Filled 2023-08-29: qty 2

## 2023-08-29 MED ORDER — AMIODARONE HCL IN DEXTROSE 360-4.14 MG/200ML-% IV SOLN
60.0000 mg/h | INTRAVENOUS | Status: AC
  Administered 2023-08-29 – 2023-08-30 (×4): 60 mg/h via INTRAVENOUS
  Filled 2023-08-29 (×4): qty 200

## 2023-08-29 MED ORDER — TORSEMIDE 20 MG PO TABS
40.0000 mg | ORAL_TABLET | Freq: Every day | ORAL | Status: DC
  Administered 2023-08-29 – 2023-08-30 (×2): 40 mg via ORAL
  Filled 2023-08-29 (×2): qty 2

## 2023-08-29 MED ORDER — ENOXAPARIN SODIUM 120 MG/0.8ML IJ SOSY
120.0000 mg | PREFILLED_SYRINGE | Freq: Two times a day (BID) | INTRAMUSCULAR | Status: DC
  Administered 2023-08-29 (×2): 120 mg via SUBCUTANEOUS
  Filled 2023-08-29 (×3): qty 0.8

## 2023-08-29 NOTE — TOC Initial Note (Signed)
 Transition of Care [facility]) - Initial/Assessment Note    Patient Details  Name: Roberto [name] MRN: [id number] Date of Birth: Dec 17, 1955  Transition of Care [facility]) CM/SW Contact:    Gala Lewandowsky, RN Phone Number: 08/29/2023, 3:20 PM  Clinical Narrative: Patient presented for  Atrial Fib with RVR. PTA patient reports he was from home with a friend. Patient states he has a rolling walker, has a PCP, and gets to appointments without any issues. No home needs identified during this visit.               Expected Discharge Plan: Home/Self Care Barriers to Discharge: No Barriers Identified   Patient Goals and CMS Choice Patient states their goals for this hospitalization and ongoing recovery are:: plan to return home once stable   Expected Discharge Plan and Services In-house Referral: NA Discharge Planning Services: CM Consult Post Acute Care Choice: NA Living arrangements for the past 2 months: Single Family Home                   DME Agency: NA    Prior Living Arrangements/Services Living arrangements for the past 2 months: Single Family Home Lives with:: Self Patient language and need for interpreter reviewed:: Yes Do you feel safe going back to the place where you live?: Yes      Need for Family Participation in Patient Care: No (Comment) Care giver support system in place?: No (comment) Current home services: Other (comment) (Friend) Criminal Activity/Legal Involvement Pertinent to Current Situation/Hospitalization: No - Comment as needed  Activities of Daily Living   ADL Screening (condition at time of admission) Independently performs ADLs?: Yes (appropriate for developmental age) Is the patient deaf or have difficulty hearing?: Yes Does the patient have difficulty seeing, even when wearing glasses/contacts?: Yes Does the patient have difficulty concentrating, remembering, or making decisions?: No  Permission Sought/Granted Permission sought to share  information with : Family Supports, Case Manager     Emotional Assessment Appearance:: Appears stated age Attitude/Demeanor/Rapport: Engaged Affect (typically observed): Appropriate Orientation: : Oriented to Self, Oriented to Place, Oriented to  Time, Oriented to Situation Alcohol / Substance Use: Not Applicable Psych Involvement: No (comment)  Admission diagnosis:  Atrial fibrillation (HCC) [I48.91] VT (ventricular tachycardia) (HCC) [I47.20] Persistent atrial fibrillation (HCC) [I48.19] Patient Active Problem List   Diagnosis Date Noted   Persistent atrial fibrillation (HCC) 07/12/2023   Visit for wound check 07/10/2023   Acute on chronic systolic CHF (congestive heart failure) (HCC) 06/22/2023   Acute kidney injury superimposed on chronic kidney disease (HCC) 06/18/2023   VT (ventricular tachycardia) (HCC) 06/18/2023   Hypotension 06/18/2023   Atrial fibrillation (HCC) 06/17/2023   Depression 06/17/2023   Gout 06/17/2023   Type 2 diabetes mellitus with chronic kidney disease, without long-term current use of insulin (HCC) 06/17/2023   Tremor of left hand 05/29/2022   Aneurysm of ascending aorta without rupture (HCC) 02/17/2022   Anticoagulated on Coumadin 07/29/2021   Chronic kidney disease, stage 3a (HCC) 12/09/2020   Hyperlipidemia associated with type 2 diabetes mellitus (HCC) 12/09/2020   Coronary artery disease involving native coronary artery of native heart without angina pectoris 09/10/2019   Osteoarthritis of spine with radiculopathy, lumbar region 11/21/2018   History of aortic stenosis 06/05/2018   Chronic gout without tophus 04/03/2018   Valvular heart disease 01/17/2018   Morbid obesity (HCC) 01/17/2018   Chronic HFrEF (heart failure with reduced ejection fraction) (HCC) 09/27/2016   Renal infarct (HCC) 05/01/2016   Type 2  diabetes mellitus with cardiac complication (HCC) 04/19/2016   Chronic bilateral low back pain without sciatica 04/19/2016   Thoracic  aortic aneurysm without rupture (HCC) 03/16/2016   Aortic atherosclerosis (HCC) 03/16/2016   Nicotine dependence, cigarettes, uncomplicated 03/14/2016   NICM (nonischemic cardiomyopathy) (HCC) 03/02/2016   COPD (chronic obstructive pulmonary disease) (HCC) 09/09/2015   H/O mechanical aortic valve replacement 02/09/2015   Hypertension associated with diabetes (HCC) 02/09/2015   PCP:  [name], DO Pharmacy:   [facility] [address] ([address]), [place] - [address] [place] ([place]) [place] [address] Phone: [phone] Fax: [phone]  [facility] [address] [place] [address] Phone: [phone] Fax: [phone]  [id number], [place] - [address] AT [facility] [address] [facility] [address] [place] [address] Phone: [phone] Fax: [phone]  [facility] [address] [place] [address] Phone: [phone] Fax: [phone]     Social Drivers of Health (SDOH) Social History: SDOH Screenings   Food Insecurity: No Food Insecurity (08/27/2023)  Housing: Low Risk  (08/27/2023)  Transportation Needs: No Transportation Needs (08/27/2023)  Utilities: At Risk (08/27/2023)  Alcohol Screen: Low Risk  (08/21/2022)  Depression (PHQ2-9): High Risk (07/27/2023)  Financial Resource Strain: Low Risk  (08/21/2022)  Physical Activity: Inactive (08/21/2022)  Social Connections: Socially Isolated (08/27/2023)  Stress: No Stress Concern Present (08/21/2022)  Tobacco Use: High Risk (08/28/2023)   SDOH Interventions:     Readmission Risk Interventions    08/29/2023    3:19 PM 06/18/2023   10:33 AM  Readmission Risk Prevention Plan  Transportation Screening Complete Complete  PCP or Specialist Appt within 3-5 Days  Complete  HRI or Home Care Consult Complete Complete  Social Work Consult for Recovery Care Planning/Counseling Complete Patient refused  Palliative Care  Screening Not Applicable Not Applicable  Medication Review Oceanographer) Referral to Pharmacy Complete

## 2023-08-29 NOTE — Progress Notes (Addendum)
 Patient Name: Roberto [name] Date of Encounter: 08/29/2023  Primary Cardiologist: Yvonne Kendall, MD Electrophysiologist: Lewayne Bunting, MD  Interval Summary   S/p [facility] yesterday, maintaining NSR with no further VT  Feeling good this am. Amiodarone order timed out.   Vital Signs    Vitals:   08/28/23 1424 08/28/23 1941 08/28/23 2329 08/29/23 0516  BP:  113/88 95/65 114/78  Pulse:  79 70 70  Resp: 19 20 18 17   Temp: 98.1 F (36.7 C) 98.9 F (37.2 C) 98.9 F (37.2 C) 98.7 F (37.1 C)  TempSrc: Oral Oral Oral Oral  SpO2:  97% 97% 97%  Weight: 124.1 kg     Height: 5\' 6"  (1.676 m)       Intake/Output Summary (Last 24 hours) at 08/29/2023 0830 Last data filed at 08/28/2023 1600 Gross per 24 hour  Intake 166.84 ml  Output --  Net 166.84 ml   Filed Weights   08/27/23 1424 08/27/23 1452 08/28/23 1424  Weight: 118 kg 125.6 kg 124.1 kg    Physical Exam    GEN- The patient is well appearing, alert and oriented x 3 today.   Lungs- Clear to ausculation bilaterally, normal work of breathing Cardiac- Regular rate and rhythm, no murmurs, rubs or gallops GI- soft, NT, ND, + BS Extremities- no clubbing or cyanosis. No edema  Telemetry    NSR 70s with occasional PVCs (personally reviewed)  Hospital Course    Roberto [name] is a 68 y.o. male with a history of persistent AF with RVR, VT, chronic systolic CHF, OSA, and mechanical aortic valve who is being seen today for the evaluation of inappropriate ATP as well as NSVT at the request of Dr. Karene Fry.   Assessment & Plan    Poorly controlled AF with RVR Resume IV amio x 24 hours in the setting of his VT storm.  Maintaining NSR Continue coumadin.  INR 2.0 this am. Will dose lovenox and potentially plan for a stock at home if INR not improved by tomorrow.  If becomes a recurring issue, may need to consider BIV upgrade with AV nodal ablation in the future.    VT NSVT Quiescent.  Continue IV amiodarone today.  Then  amiodarone 400 mg BID x 5 days, then 200 mg BID until follow up.    Elevated INR Now low Lovenox today, potentially for home too if not improve by tomorrow.  Previously, I have had a lengthy and difficult discussion with his caregiver re: his coumadin management. Goal is reportedly 2.5-3.5, but had planned to give him a dose today despite 7.0 INR last night at home, as well as instructions from Dr. Ladona Ridgel to hold.   She will need further instructions from pharmacy, specifically about letting INR trend down.    Acute on chronic CHF Resume home diuretic.  EF 20% on cMRI 12/23.  GDMT as tolerated. Beta blocker has previously been held with soft pressures   Mechanical MV Continue coumadin as above.    For questions or updates, please contact [facility] Please consult [email or web address] for contact info under Cardiology/STEMI.  Signed, Graciella Freer, [name]  08/29/2023, 8:30 AM   EP Attending  Patient seen and examined. He is better today maintaining NSR. Lungs are clear and CV with a RRR and ext with trace edema. Tele - NSR.  A/P VT - he is maintaining NSR. Continue amio. Atrial fib - continue amio. Maintaining NSR Chronic systolic heart failure -  he is class 2. Continue GDMT. Plan to DC  home tomorrow if stable.  Roberto Gowda [name],MD

## 2023-08-29 NOTE — Progress Notes (Addendum)
 PHARMACY - ANTICOAGULATION CONSULT NOTE  Pharmacy Consult for warfarin Indication: atrial fibrillation, mechanical aortic valve replacement  Allergies  Allergen Reactions   Nicotine     Patches caused localized rash and hives     Patient Measurements: Height: 5\' 6"  (167.6 cm) Weight: 124.1 kg (273 lb 9.5 oz) IBW/kg (Calculated) : 63.8   Vital Signs: Temp: 98.7 F (37.1 C) (02/26 0516) Temp Source: Oral (02/26 0516) BP: 114/78 (02/26 0516) Pulse Rate: 70 (02/26 0516)  Labs: Recent Labs    08/27/23 1500 08/27/23 1747 08/28/23 0244 08/28/23 0627 08/29/23 0454  HGB 13.4  --   --  13.3 12.6*  HCT 43.3  --   --  42.4 40.6  PLT 289  --   --  280 223  LABPROT  --  35.3* 27.2*  --  22.7*  INR  --  3.5* 2.5*  --  2.0*  CREATININE 1.56*  --   --  1.55* 1.40*  TROPONINIHS 69* 71*  --   --   --     Estimated Creatinine Clearance: 61.9 mL/min (A) (by C-G formula based on SCr of 1.4 mg/dL (H)).   Medical History: Past Medical History:  Diagnosis Date   Aneurysm (HCC)    Bicuspid aortic valve    a. s/p #27 [employer]omed[employer] mechanical valve on 03/25/2010; b. on Coumadin; c. TTE 12/17: EF 40-45%, moderately dilated LV with moderate LVH, AVR well-seated with 14 mmHg gradient, peak AV velocity 2.5 m/s, mild mitral valve thickening with mild MR, mildly dilated RV with mildly reduced contraction   Cellulitis    CHF (congestive heart failure) (HCC)    Chronic kidney disease    Chronic systolic CHF (congestive heart failure) (HCC)    a. R/LHC 03/2010 showed no significant CAD, LVEDP 31 mmHg, mean AoV gradient 34 mmHg at rest and 47 mmHg with dobutamine 20 mcg/kg/min, AVA 1.0 cm^2, RA 31, RV 68/25, PA 68/47, PCWP 38. PA sat 65%. CO 6.2 L/min (Fick) and 5.3 L/min (thermodilution)   Clotting disorder (HCC)    COPD (chronic obstructive pulmonary disease) (HCC)    H/O mechanical aortic valve replacement 03/25/2010   a. #27 [employer]omed[employer] mechanical valve   Hearing loss    High cholesterol     HTN (hypertension)    Hypercholesterolemia    Renal infarct [facility]) 2017   Multiple right renal infarcts, likely embolic.   Stage 3 chronic kidney disease (HCC)    Stroke [facility])    TIA (transient ischemic attack) 05/2014     Assessment: 72 yoM with AFib and mAVR admitted with VT and AF RVR s/p DCCV 2/25.  INR today is subtherapeutic at 2.0, goal INR 2.5-3.5 given valve so will bridge (ok per EP). Home warfarin dose was an alternating regimen of 4mg , 4mg , 7.5mg  then repeated. Per pt this has "worked for over 10 years" until amiodarone was started ~06/2023.  Of note - pt has home INR machine that has likely become inaccurate. Encouraged pt to bring this in for correlation with our lab drawn INR but pt reports it will be unable to be brought in until late tonight when he is discharged so unsure how useful this will be by then. Educated pt to check INR as soon as able and it should be ~2.0 +/- 0.3 or so, if not he will need to obtain new machine or get INR tested at PCP office moving forward.   Goal of Therapy:  INR 2.5-3.5 Monitor platelets by anticoagulation protocol: Yes   Plan:  -  Enoxaparin 120mg  SQ BID until INR >2.5 -Warfarin 7.5mg  x1 tonight then 4mg  thereafter (reducing dose since INRs have been high with amiodarone) -Pt educated and in agreement on plan   Fredonia Highland, PharmD, BCPS, [facility] Clinical Pharmacist [phone] Please check AMION for all [facility] Pharmacy numbers 08/29/2023

## 2023-08-29 NOTE — Plan of Care (Signed)
  Problem: Education: Goal: Knowledge of disease or condition will improve Outcome: Progressing Goal: Understanding of medication regimen will improve Outcome: Progressing Goal: Individualized Educational Video(s) Outcome: Progressing   Problem: Activity: Goal: Ability to tolerate increased activity will improve Outcome: Progressing   Problem: Cardiac: Goal: Ability to achieve and maintain adequate cardiopulmonary perfusion will improve Outcome: Progressing   Problem: Health Behavior/Discharge Planning: Goal: Ability to safely manage health-related needs after discharge will improve Outcome: Progressing   Problem: Education: Goal: Knowledge of General Education information will improve Description: Including pain rating scale, medication(s)/side effects and non-pharmacologic comfort measures Outcome: Progressing   Problem: Health Behavior/Discharge Planning: Goal: Ability to manage health-related needs will improve Outcome: Progressing   Problem: Clinical Measurements: Goal: Ability to maintain clinical measurements within normal limits will improve Outcome: Progressing Goal: Will remain free from infection Outcome: Progressing Goal: Diagnostic test results will improve Outcome: Progressing Goal: Respiratory complications will improve Outcome: Progressing Goal: Cardiovascular complication will be avoided Outcome: Progressing   Problem: Activity: Goal: Risk for activity intolerance will decrease Outcome: Progressing   Problem: Nutrition: Goal: Adequate nutrition will be maintained Outcome: Progressing   Problem: Coping: Goal: Level of anxiety will decrease Outcome: Progressing   Problem: Elimination: Goal: Will not experience complications related to bowel motility Outcome: Progressing Goal: Will not experience complications related to urinary retention Outcome: Progressing   Problem: Pain Managment: Goal: General experience of comfort will improve and/or be  controlled Outcome: Progressing   Problem: Safety: Goal: Ability to remain free from injury will improve Outcome: Progressing   Problem: Skin Integrity: Goal: Risk for impaired skin integrity will decrease Outcome: Progressing

## 2023-08-29 NOTE — Telephone Encounter (Signed)
 Patient Product/process development scientist completed.    The patient is insured through [facility]. Patient has Medicare and is not eligible for a copay card, but may be able to apply for patient assistance or Medicare RX Payment Plan (Patient Must reach out to their plan, if eligible for payment plan), if available.    Ran test claim for enoxaparin (Lovenox) 120 mg and the current 3 day co-pay is $0.00.   This test claim was processed through [facility]- copay amounts may vary at other pharmacies due to pharmacy/plan contracts, or as the patient moves through the different stages of their insurance plan.     Roland Earl, CPHT Pharmacy Technician III Certified Patient Advocate [facility] Pharm[facility] Patient Advocate Team Direct Number: [phone]  Fax: [phone]

## 2023-08-30 ENCOUNTER — Other Ambulatory Visit (HOSPITAL_COMMUNITY): Payer: Self-pay

## 2023-08-30 DIAGNOSIS — I4819 Other persistent atrial fibrillation: Secondary | ICD-10-CM | POA: Diagnosis not present

## 2023-08-30 DIAGNOSIS — I5022 Chronic systolic (congestive) heart failure: Secondary | ICD-10-CM | POA: Diagnosis not present

## 2023-08-30 LAB — BASIC METABOLIC PANEL
Anion gap: 16 — ABNORMAL HIGH (ref 5–15)
BUN: 25 mg/dL — ABNORMAL HIGH (ref 8–23)
CO2: 24 mmol/L (ref 22–32)
Calcium: 9.2 mg/dL (ref 8.9–10.3)
Chloride: 98 mmol/L (ref 98–111)
Creatinine, Ser: 1.6 mg/dL — ABNORMAL HIGH (ref 0.61–1.24)
GFR, Estimated: 46 mL/min — ABNORMAL LOW (ref >60)
Glucose, Bld: 95 mg/dL (ref 70–99)
Potassium: 3.4 mmol/L — ABNORMAL LOW (ref 3.5–5.1)
Sodium: 138 mmol/L (ref 135–145)

## 2023-08-30 LAB — CBC
HCT: 41.3 % (ref 39.0–52.0)
Hemoglobin: 13 g/dL (ref 13.0–17.0)
MCH: 27.9 pg (ref 26.0–34.0)
MCHC: 31.5 g/dL (ref 30.0–36.0)
MCV: 88.6 fL (ref 80.0–100.0)
Platelets: 233 10*3/uL (ref 150–400)
RBC: 4.66 MIL/uL (ref 4.22–5.81)
RDW: 17.1 % — ABNORMAL HIGH (ref 11.5–15.5)
WBC: 8.9 10*3/uL (ref 4.0–10.5)
nRBC: 0 % (ref 0.0–0.2)

## 2023-08-30 LAB — PROTIME-INR
INR: 2.7 — ABNORMAL HIGH (ref 0.8–1.2)
Prothrombin Time: 29.2 s — ABNORMAL HIGH (ref 11.4–15.2)

## 2023-08-30 LAB — POCT INR: INR: 4 — AB (ref 2.0–3.0)

## 2023-08-30 MED ORDER — WARFARIN SODIUM 2 MG PO TABS: 4.0000 mg | ORAL_TABLET | Freq: Every day | ORAL | Status: DC

## 2023-08-30 MED ORDER — AMIODARONE HCL 200 MG PO TABS
ORAL_TABLET | ORAL | 0 refills | Status: DC
  Filled 2023-08-30 (×2): qty 60, 25d supply, fill #0

## 2023-08-30 MED ORDER — WARFARIN SODIUM 4 MG PO TABS: ORAL_TABLET | ORAL | Status: DC

## 2023-08-30 MED ORDER — POTASSIUM CHLORIDE CRYS ER 20 MEQ PO TBCR
40.0000 meq | EXTENDED_RELEASE_TABLET | Freq: Once | ORAL | Status: AC
  Administered 2023-08-30: 40 meq via ORAL
  Filled 2023-08-30: qty 2

## 2023-08-30 NOTE — Progress Notes (Signed)
 PHARMACY - ANTICOAGULATION CONSULT NOTE  Pharmacy Consult for warfarin Indication: atrial fibrillation, mechanical aortic valve replacement  Allergies  Allergen Reactions   Nicotine     Patches caused localized rash and hives     Patient Measurements: Height: 5\' 6"  (167.6 cm) Weight: 124.1 kg (273 lb 9.5 oz) IBW/kg (Calculated) : 63.8   Vital Signs: Temp: 98.7 F (37.1 C) (02/27 0345) Temp Source: Oral (02/27 0345) BP: 102/71 (02/27 0345) Pulse Rate: 61 (02/26 2117)  Labs: Recent Labs    08/27/23 1500 08/27/23 1747 08/27/23 1747 08/28/23 0244 08/28/23 0627 08/29/23 0454 08/30/23 0429  HGB 13.4  --   --   --  13.3 12.6* 13.0  HCT 43.3  --   --   --  42.4 40.6 41.3  PLT 289  --   --   --  280 223 233  LABPROT  --  35.3*   < > 27.2*  --  22.7* 29.2*  INR  --  3.5*   < > 2.5*  --  2.0* 2.7*  CREATININE 1.56*  --   --   --  1.55* 1.40* 1.60*  TROPONINIHS 69* 71*  --   --   --   --   --    < > = values in this interval not displayed.    Estimated Creatinine Clearance: 54.2 mL/min (A) (by C-G formula based on SCr of 1.6 mg/dL (H)).   Medical History: Past Medical History:  Diagnosis Date   Aneurysm (HCC)    Bicuspid aortic valve    a. s/p #27 [employer] mechanical valve on 03/25/2010; b. on Coumadin; c. TTE 12/17: EF 40-45%, moderately dilated LV with moderate LVH, AVR well-seated with 14 mmHg gradient, peak AV velocity 2.5 m/s, mild mitral valve thickening with mild MR, mildly dilated RV with mildly reduced contraction   Cellulitis    CHF (congestive heart failure) (HCC)    Chronic kidney disease    Chronic systolic CHF (congestive heart failure) (HCC)    a. R/LHC 03/2010 showed no significant CAD, LVEDP 31 mmHg, mean AoV gradient 34 mmHg at rest and 47 mmHg with dobutamine 20 mcg/kg/min, AVA 1.0 cm^2, RA 31, RV 68/25, PA 68/47, PCWP 38. PA sat 65%. CO 6.2 L/min (Fick) and 5.3 L/min (thermodilution)   Clotting disorder (HCC)    COPD (chronic obstructive pulmonary  disease) (HCC)    H/O mechanical aortic valve replacement 03/25/2010   a. #27 [employer] mechanical valve   Hearing loss    High cholesterol    HTN (hypertension)    Hypercholesterolemia    Renal infarct [facility]) 2017   Multiple right renal infarcts, likely embolic.   Stage 3 chronic kidney disease (HCC)    Stroke [facility])    TIA (transient ischemic attack) 05/2014     Assessment: 60 yoM with AFib and mAVR admitted with VT and AF RVR s/p DCCV 2/25. Goal INR is 2.5-3.5, home dosing confirmed with patient as an alternating regimen of 4mg , 4mg , 7.5mg  then repeated. Per pt this has "worked for over 10 years" until amiodarone was started ~06/2023.  INR subtherapeutic 2/26 and enoxaparin added as bridge. INR today up to 2.7 so will stop enoxaparin. Will plan to discharge on 4mg  regimen as amiodarone will continue and pt has had high PTA INR since starting this.  Of note - pt has home INR machine that has likely become inaccurate. Encouraged pt to bring this in for correlation with our lab drawn INR but pt reports it will be  unable to be brought in until late tonight when he is discharged so unsure how useful this will be by then. Educated pt to check INR as soon as able and it should be ~2.7 +/- 0.3 or so, if not he will need to obtain new machine or get INR tested at PCP office moving forward.   Goal of Therapy:  INR 2.5-3.5 Monitor platelets by anticoagulation protocol: Yes   Plan:  -Stop enoxaparin -Warfarin 4mg  daily going forward -Pt educated and in agreement on plan   Fredonia Highland, PharmD, BCPS, [facility] Clinical Pharmacist [phone] Please check AMION for all [facility] Pharmacy numbers 08/30/2023

## 2023-08-30 NOTE — Telephone Encounter (Signed)
 Patient currently in hospital for persistent AF.  Appears patient never went to have his Amio level drawn.  FYI to GT, ? Need to collect while admitted.

## 2023-08-30 NOTE — Discharge Summary (Signed)
 ELECTROPHYSIOLOGY DISCHARGE SUMMARY    Patient ID: Roberto [name],  MRN: [id number], DOB/AGE: 04-Jan-1956 68 y.o.  Admit date: 08/27/2023 Discharge date: 08/30/2023  Primary Care Physician: [name], DO  Primary Cardiologist: Yvonne Kendall, MD  Electrophysiologist: Dr. Ladona Ridgel   Primary Discharge Diagnosis:  Poorly controlled AF with RVR VT  Secondary Discharge Diagnosis:  Elevated INR  Acute on chronic systolic CHF H/o Mechanical MV  Procedures This Admission:  Cardioversion 08/28/2023 by Dr. Cristal Deer to NSR  Brief HPI: [name] is a 68 y.o. male with a history of persistent AF with RVR, VT, chronic systolic CHF, OSA, and mechanical aortic valve admitted for appropriate and inappropriate ICD therapies.   Hospital Course:  The patient was admitted with multiple episodes of ATP. Some felt to be true sustained VT and others with AF and RVR. Therapy zones adjusted to try and prevent inappropriate shocks.  Pt loaded on IV amiodarone and underwent DCC to NSR 08/28/2023 by Dr. Cristal Deer. He was loaded further on IV amiodarone and transitioned to po on day of discharge, with close follow up planned as below.  They were monitored on telemetry overnight which demonstrated no further sustained VT or AF s/p DCC.  Pt had multiple teachings re: INR, but caregiver stated "we are going to keep doing what we've been doing" despite multiple high INRs.   The patient was examined and considered to be stable for discharge.  The patient will be seen back by EP APP in approx 2 weeks for post hospital care.   Pt will take amiodarone 400 mg BID x 5 days, then 200 mg BID until follow up.   Pharmacy recommendation is currently to take coumadin 4 mg DAILY as adjust as needed.   Physical Exam: Vitals:   08/29/23 0516 08/29/23 1208 08/29/23 2117 08/30/23 0345  BP: 114/78 101/73 94/72 102/71  Pulse: 70  61   Resp: 17 18 17 18   Temp: 98.7 F (37.1 C) 97.9 F (36.6 C) 98.5 F (36.9  C) 98.7 F (37.1 C)  TempSrc: Oral Oral Oral Oral  SpO2: 97%  97% 97%  Weight:      Height:        GEN- NAD. A&O x 3.  HEENT: Normocephalic, atraumatic Lungs- CTAB, Normal effort.  Heart- RRR, No M/G/R.  GI- Soft, NT, ND.  Extremities- No clubbing, cyanosis, or edema; Groin without hematoma   Discharge Medications:  Allergies as of 08/30/2023       Reactions   Nicotine    Patches caused localized rash and hives         Medication List     TAKE these medications    acetaminophen 500 MG tablet Commonly known as: TYLENOL Take 1,000 mg by mouth every 6 (six) hours as needed for moderate pain (pain score 4-6).   albuterol (2.5 MG/3ML) 0.083% nebulizer solution Commonly known as: PROVENTIL USE 1 VIAL IN NEBULIZER EVERY 6 HOURS AS NEEDED FOR WHEEZING FOR SHORTNESS OF BREATH   Ventolin HFA 108 (90 Base) MCG/ACT inhaler Generic drug: albuterol INHALE 2 PUFFS BY MOUTH EVERY 6 HOURS AS NEEDED FOR WHEEZING OR SHORTNESS OF BREATH   allopurinol 300 MG tablet Commonly known as: ZYLOPRIM Take 1 tablet (300 mg total) by mouth daily. What changed: when to take this   amiodarone 200 MG tablet Commonly known as: PACERONE Take 2 tablets (400 mg total) by mouth 2 (two) times daily for 5 days, THEN 1 tablet (200 mg total) 2 (two) times daily for  20 days. Start taking on: August 30, 2023 What changed: See the new instructions.   amoxicillin-clavulanate 875-125 MG tablet Commonly known as: AUGMENTIN Take 1 tablet by mouth every 12 (twelve) hours.   aspirin 81 MG chewable tablet Chew 81 mg by mouth in the morning.   buPROPion 150 MG 12 hr tablet Commonly known as: WELLBUTRIN SR Take 150 mg by mouth 2 (two) times daily.   buPROPion 300 MG 24 hr tablet Commonly known as: WELLBUTRIN XL Take 300 mg by mouth daily.   dapagliflozin propanediol 10 MG Tabs tablet Commonly known as: Farxiga Take 1 tablet (10 mg total) by mouth daily before breakfast. What changed:  when to  take this Another medication with the same name was removed. Continue taking this medication, and follow the directions you see here.   fenofibrate 48 MG tablet Commonly known as: TRICOR Take 1 tablet (48 mg total) by mouth daily. What changed: when to take this   losartan 25 MG tablet Commonly known as: COZAAR Take 0.5 tablets (12.5 mg total) by mouth at bedtime.   metFORMIN 500 MG 24 hr tablet Commonly known as: GLUCOPHAGE-XR Take 1 tablet (500 mg total) by mouth daily with breakfast.   nicotine polacrilex 2 MG lozenge Commonly known as: COMMIT Take 2 mg by mouth every 4 (four) hours as needed for smoking cessation.   nicotine polacrilex 2 MG gum Commonly known as: NICORETTE Take 2 mg by mouth every 6 (six) hours.   nortriptyline 10 MG capsule Commonly known as: PAMELOR Take 1 capsule (10 mg total) by mouth at bedtime.   OMEGA 3 PO Take 1,500 mg by mouth 2 (two) times daily.   Ozempic (1 MG/DOSE) 4 MG/3ML Sopn Generic drug: Semaglutide (1 MG/DOSE) Inject 1 mg into the skin once a week. On Mondays   potassium chloride SA 20 MEQ tablet Commonly known as: KLOR-CON M Take 20 mEq by mouth at bedtime.   rosuvastatin 40 MG tablet Commonly known as: CRESTOR Take 1 tablet (40 mg total) by mouth daily. What changed: when to take this   Spacer/Aero-Holding Rudean Curt Use as directed Dx: COPD, J44.9   [employer] See [employer].   AeroChamber MV inhaler Use as instructed   spironolactone 25 MG tablet Commonly known as: ALDACTONE Take 0.5 tablets (12.5 mg total) by mouth daily. What changed: when to take this   torsemide 20 MG tablet Commonly known as: DEMADEX Take 40 mg by mouth in the morning.   Trelegy Ellipta 100-62.5-25 MCG/ACT Aepb Generic drug: Fluticasone-Umeclidin-Vilant Inhale 1 puff by mouth once daily What changed:  how much to take how to take this when to take this additional instructions   warfarin 4 MG  tablet Commonly known as: COUMADIN Take as directed. If you are unsure how to take this medication, talk to your nurse or doctor. Original instructions: TAKE 1 TABLET BY MOUTH ONCE DAILY AT  6PM  AS  DIRECTED What changed:  additional instructions Another medication with the same name was removed. Continue taking this medication, and follow the directions you see here.        Disposition: Home with usual follow up as in AVS  Duration of Discharge Encounter:  APP time: 26 minutes  Signed, Graciella Freer, [name]  08/30/2023 9:09 AM

## 2023-08-30 NOTE — Plan of Care (Signed)
 Patient ID: Roberto [name], male   DOB: September 05, 1955, 68 y.o.   MRN: [id number]  Problem: Education: Goal: Knowledge of disease or condition will improve Outcome: Adequate for Discharge Goal: Understanding of medication regimen will improve Outcome: Adequate for Discharge Goal: Individualized Educational Video(s) Outcome: Adequate for Discharge   Problem: Activity: Goal: Ability to tolerate increased activity will improve Outcome: Adequate for Discharge   Problem: Cardiac: Goal: Ability to achieve and maintain adequate cardiopulmonary perfusion will improve Outcome: Adequate for Discharge   Problem: Health Behavior/Discharge Planning: Goal: Ability to safely manage health-related needs after discharge will improve Outcome: Adequate for Discharge   Problem: Education: Goal: Knowledge of General Education information will improve Description: Including pain rating scale, medication(s)/side effects and non-pharmacologic comfort measures Outcome: Adequate for Discharge   Problem: Health Behavior/Discharge Planning: Goal: Ability to manage health-related needs will improve Outcome: Adequate for Discharge   Problem: Clinical Measurements: Goal: Ability to maintain clinical measurements within normal limits will improve Outcome: Adequate for Discharge Goal: Will remain free from infection Outcome: Adequate for Discharge Goal: Diagnostic test results will improve Outcome: Adequate for Discharge Goal: Respiratory complications will improve Outcome: Adequate for Discharge Goal: Cardiovascular complication will be avoided Outcome: Adequate for Discharge   Problem: Activity: Goal: Risk for activity intolerance will decrease Outcome: Adequate for Discharge   Problem: Nutrition: Goal: Adequate nutrition will be maintained Outcome: Adequate for Discharge   Problem: Coping: Goal: Level of anxiety will decrease Outcome: Adequate for Discharge   Problem: Elimination: Goal: Will not  experience complications related to bowel motility Outcome: Adequate for Discharge Goal: Will not experience complications related to urinary retention Outcome: Adequate for Discharge   Problem: Pain Managment: Goal: General experience of comfort will improve and/or be controlled Outcome: Adequate for Discharge   Problem: Safety: Goal: Ability to remain free from injury will improve Outcome: Adequate for Discharge   Problem: Skin Integrity: Goal: Risk for impaired skin integrity will decrease Outcome: Adequate for Discharge  Lidia Collum, RN

## 2023-08-31 ENCOUNTER — Telehealth: Payer: Self-pay

## 2023-08-31 NOTE — Transitions of Care (Post Inpatient/ED Visit) (Signed)
   08/31/2023  Name: Roberto [name] MRN: [id number] DOB: May 23, 1956  Today's TOC FU Call Status: Today's TOC FU Call Status:: Unsuccessful Call (1st Attempt) Unsuccessful Call (1st Attempt) Date: 08/31/23  Attempted to reach the patient regarding the most recent Inpatient/ED visit. Patient was called in an Outreach attempt to offer [facility]BCI  30-day TOC program. Pt is eligible for program due to potential risk for readmission and/or high utilization. Unfortunately, I was not able to speak with the patient in regards to recent hospital discharge    Patient's voicemail has  a generic greeting. To maintain HIPAA compliance, left message with VBCI CM contact information only  and a request for a call back .   Follow Up Plan: Additional outreach attempts will be made to reach the patient to complete the Transitions of Care (Post Inpatient/ED visit) call.   Susa Loffler , BSN, RN [facility]   VBCI-Population Health RN Care Manager Direct Dial [phone]  Fax: [phone] Website: [email or web address]

## 2023-09-02 ENCOUNTER — Telehealth: Admitting: Physician Assistant

## 2023-09-02 ENCOUNTER — Encounter: Payer: Self-pay | Admitting: Physician Assistant

## 2023-09-02 DIAGNOSIS — J069 Acute upper respiratory infection, unspecified: Secondary | ICD-10-CM | POA: Diagnosis not present

## 2023-09-02 DIAGNOSIS — Z5321 Procedure and treatment not carried out due to patient leaving prior to being seen by health care provider: Secondary | ICD-10-CM

## 2023-09-02 MED ORDER — AMOXICILLIN-POT CLAVULANATE 875-125 MG PO TABS: 1.0000 | ORAL_TABLET | Freq: Two times a day (BID) | ORAL | 0 refills | Status: AC

## 2023-09-02 MED ORDER — BENZONATATE 100 MG PO CAPS: ORAL_CAPSULE | ORAL | 0 refills | Status: DC

## 2023-09-02 NOTE — Progress Notes (Signed)
 Virtual Visit Consent   Roberto [name], you are scheduled for a virtual visit with a [facility] provider today. Just as with appointments in the office, your consent must be obtained to participate. Your consent will be active for this visit and any virtual visit you may have with one of our providers in the next 365 days. If you have a MyChart account, a copy of this consent can be sent to you electronically.  As this is a virtual visit, video technology does not allow for your provider to perform a traditional examination. This may limit your provider's ability to fully assess your condition. If your provider identifies any concerns that need to be evaluated in person or the need to arrange testing (such as labs, EKG, etc.), we will make arrangements to do so. Although advances in technology are sophisticated, we cannot ensure that it will always work on either your end or our end. If the connection with a video visit is poor, the visit may have to be switched to a telephone visit. With either a video or telephone visit, we are not always able to ensure that we have a secure connection.  By engaging in this virtual visit, you consent to the provision of healthcare and authorize for your insurance to be billed (if applicable) for the services provided during this visit. Depending on your insurance coverage, you may receive a charge related to this service.  I need to obtain your verbal consent now. Are you willing to proceed with your visit today? Roberto [name] has provided verbal consent on 09/02/2023 for a virtual visit (video or telephone). Roberto Jaffe, [name]  Date: 09/02/2023 4:59 PM   Virtual Visit via Video Note   I, Roberto [name], connected with  Roberto [name]  ([id number], 12-15-1955) on 09/02/23 at  5:15 PM EST by a video-enabled telemedicine application and verified that I am speaking with the correct person using two identifiers.  Location: Patient: Virtual Visit Location Patient:  Home Provider: Virtual Visit Location Provider: Home Office   I discussed the limitations of evaluation and management by telemedicine and the availability of in person appointments. The patient expressed understanding and agreed to proceed.    History of Present Illness: Roberto [name] is a 68 y.o. who identifies as a male who was assigned male at birth,  recently discharged from the hospital, presents with a history of difficulty breathing, shortness of breath, coughing, and body aches. The patient denies recent exposure to sick individuals. Over-the-counter medications, including Safe-Tussin cough and cold, chloraseptic for sore throat, Coricidin for cold and flu, and extra strength Tylenol, have been used for symptom relief. Prior to hospital admission, the patient was on a seven-day course of amoxicillin and prednisone for an upper respiratory infection, with two days of amoxicillin remaining at the time of the current consultation. The patient also uses a nebulizer for breathing difficulties, which reportedly provides some relief. Problems:  Patient Active Problem List   Diagnosis Date Noted   Persistent atrial fibrillation (HCC) 07/12/2023   Visit for wound check 07/10/2023   Acute on chronic systolic CHF (congestive heart failure) (HCC) 06/22/2023   Acute kidney injury superimposed on chronic kidney disease (HCC) 06/18/2023   VT (ventricular tachycardia) (HCC) 06/18/2023   Hypotension 06/18/2023   Atrial fibrillation (HCC) 06/17/2023   Depression 06/17/2023   Gout 06/17/2023   Type 2 diabetes mellitus with chronic kidney disease, without long-term current use of insulin (HCC) 06/17/2023   Tremor of left hand 05/29/2022  Aneurysm of ascending aorta without rupture (HCC) 02/17/2022   Anticoagulated on Coumadin 07/29/2021   Chronic kidney disease, stage 3a (HCC) 12/09/2020   Hyperlipidemia associated with type 2 diabetes mellitus (HCC) 12/09/2020   Coronary artery disease involving  native coronary artery of native heart without angina pectoris 09/10/2019   Osteoarthritis of spine with radiculopathy, lumbar region 11/21/2018   History of aortic stenosis 06/05/2018   Chronic gout without tophus 04/03/2018   Valvular heart disease 01/17/2018   Morbid obesity (HCC) 01/17/2018   Chronic HFrEF (heart failure with reduced ejection fraction) (HCC) 09/27/2016   Renal infarct (HCC) 05/01/2016   Type 2 diabetes mellitus with cardiac complication (HCC) 04/19/2016   Chronic bilateral low back pain without sciatica 04/19/2016   Thoracic aortic aneurysm without rupture (HCC) 03/16/2016   Aortic atherosclerosis (HCC) 03/16/2016   Nicotine dependence, cigarettes, uncomplicated 03/14/2016   NICM (nonischemic cardiomyopathy) (HCC) 03/02/2016   COPD (chronic obstructive pulmonary disease) (HCC) 09/09/2015   H/O mechanical aortic valve replacement 02/09/2015   Hypertension associated with diabetes (HCC) 02/09/2015    Allergies:  Allergies  Allergen Reactions   Nicotine     Patches caused localized rash and hives    Medications:  Current Outpatient Medications:    benzonatate (TESSALON) 100 MG capsule, Take 1-2 caps PO TID PRN, Disp: 20 capsule, Rfl: 0   acetaminophen (TYLENOL) 500 MG tablet, Take 1,000 mg by mouth every 6 (six) hours as needed for moderate pain (pain score 4-6)., Disp: , Rfl:    albuterol (PROVENTIL) (2.5 MG/3ML) 0.083% nebulizer solution, USE 1 VIAL IN NEBULIZER EVERY 6 HOURS AS NEEDED FOR WHEEZING FOR SHORTNESS OF BREATH, Disp: 180 mL, Rfl: 1   allopurinol (ZYLOPRIM) 300 MG tablet, Take 1 tablet (300 mg total) by mouth daily. (Patient taking differently: Take 300 mg by mouth in the morning.), Disp: 90 tablet, Rfl: 1   amiodarone (PACERONE) 200 MG tablet, Take 2 tablets (400 mg total) by mouth 2 (two) times daily for 5 days, THEN 1 tablet (200 mg total) 2 (two) times daily for 20 days., Disp: 60 tablet, Rfl: 0   amoxicillin-clavulanate (AUGMENTIN) 875-125 MG tablet,  Take 1 tablet by mouth every 12 (twelve) hours for 3 days., Disp: 6 tablet, Rfl: 0   aspirin 81 MG chewable tablet, Chew 81 mg by mouth in the morning., Disp: , Rfl:    buPROPion (WELLBUTRIN SR) 150 MG 12 hr tablet, Take 150 mg by mouth 2 (two) times daily., Disp: , Rfl:    buPROPion (WELLBUTRIN XL) 300 MG 24 hr tablet, Take 300 mg by mouth daily. (Patient not taking: Reported on 08/27/2023), Disp: , Rfl:    dapagliflozin propanediol (FARXIGA) 10 MG TABS tablet, Take 1 tablet (10 mg total) by mouth daily before breakfast. (Patient taking differently: Take 10 mg by mouth at bedtime.), Disp: 30 tablet, Rfl: 11   fenofibrate (TRICOR) 48 MG tablet, Take 1 tablet (48 mg total) by mouth daily. (Patient taking differently: Take 48 mg by mouth at bedtime.), Disp: 90 tablet, Rfl: 3   losartan (COZAAR) 25 MG tablet, Take 0.5 tablets (12.5 mg total) by mouth at bedtime., Disp: 45 tablet, Rfl: 3   metFORMIN (GLUCOPHAGE-XR) 500 MG 24 hr tablet, Take 1 tablet (500 mg total) by mouth daily with breakfast., Disp: 90 tablet, Rfl: 1   nicotine polacrilex (COMMIT) 2 MG lozenge, Take 2 mg by mouth every 4 (four) hours as needed for smoking cessation. (Patient not taking: Reported on 08/27/2023), Disp: , Rfl:    nicotine  polacrilex (NICORETTE) 2 MG gum, Take 2 mg by mouth every 6 (six) hours., Disp: , Rfl:    nortriptyline (PAMELOR) 10 MG capsule, Take 1 capsule (10 mg total) by mouth at bedtime., Disp: 90 capsule, Rfl: 1   Omega-3 Fatty Acids (OMEGA 3 PO), Take 1,500 mg by mouth 2 (two) times daily., Disp: , Rfl:    OZEMPIC, 1 MG/DOSE, 4 MG/3ML SOPN, Inject 1 mg into the skin once a week. On Mondays, Disp: , Rfl:    potassium chloride SA (KLOR-CON M) 20 MEQ tablet, Take 20 mEq by mouth at bedtime., Disp: , Rfl:    rosuvastatin (CRESTOR) 40 MG tablet, Take 1 tablet (40 mg total) by mouth daily. (Patient taking differently: Take 40 mg by mouth at bedtime.), Disp: 90 tablet, Rfl: 3   Spacer/Aero-Holding Chambers (AEROCHAMBER  MV) inhaler, Use as instructed, Disp: 1 each, Rfl: 1   Spacer/Aero-Holding Chambers (EQ SPACE CHAMBER ANTI-STATIC) DEVI, See admin instructions., Disp: , Rfl:    Spacer/Aero-Holding Chambers DEVI, Use as directed Dx: COPD, J44.9, Disp: 1 each, Rfl: 1   spironolactone (ALDACTONE) 25 MG tablet, Take 0.5 tablets (12.5 mg total) by mouth daily. (Patient taking differently: Take 12.5 mg by mouth at bedtime.), Disp: 45 tablet, Rfl: 3   torsemide (DEMADEX) 20 MG tablet, Take 40 mg by mouth in the morning., Disp: , Rfl:    TRELEGY ELLIPTA 100-62.5-25 MCG/ACT AEPB, Inhale 1 puff by mouth once daily (Patient taking differently: Inhale 1 puff into the lungs in the morning.), Disp: 180 each, Rfl: 1   VENTOLIN HFA 108 (90 Base) MCG/ACT inhaler, INHALE 2 PUFFS BY MOUTH EVERY 6 HOURS AS NEEDED FOR WHEEZING OR SHORTNESS OF BREATH, Disp: 54 g, Rfl: 6   warfarin (COUMADIN) 4 MG tablet, TAKE 1 TABLET BY MOUTH ONCE DAILY AT  6PM  AS  DIRECTED, Disp: , Rfl:   Observations/Objective: Patient is well-developed, well-nourished in no acute distress.  Resting comfortably  at home.  Head is normocephalic, atraumatic.  No labored breathing.  Speech is clear and coherent with logical content.  Patient is alert and oriented at baseline.    Assessment and Plan: 1. Upper respiratory tract infection, unspecified type (Primary) - amoxicillin-clavulanate (AUGMENTIN) 875-125 MG tablet; Take 1 tablet by mouth every 12 (twelve) hours for 3 days.  Dispense: 6 tablet; Refill: 0 - benzonatate (TESSALON) 100 MG capsule; Take 1-2 caps PO TID PRN  Dispense: 20 capsule; Refill: 0   Home Covid test negative   New onset of cough, shortness of breath, and body aches starting yesterday. No known sick contacts. Recently discharged from the hospital after a cardioversion. Currently on a course of Amoxicillin , with two days left of Amoxicillin. Using over-the-counter cold and flu medications and a nebulizer, which provides some  relief.  -Continue current OTC medications and nebulizer treatments. Extend Amoxicillin by 3 days.Seek in person eval if not improvement     Follow Up Instructions: I discussed the assessment and treatment plan with the patient. The patient was provided an opportunity to ask questions and all were answered. The patient agreed with the plan and demonstrated an understanding of the instructions.  A copy of instructions were sent to the patient via MyChart unless otherwise noted below.     The patient was advised to call back or seek an in-person evaluation if the symptoms worsen or if the condition fails to improve as anticipated.    Roberto Knudsen Mayers, [name]

## 2023-09-02 NOTE — Patient Instructions (Signed)
 [name], thank you for joining [name], [name] for today's virtual visit.  While this provider is not your primary care provider (PCP), if your PCP is located in our provider database this encounter information will be shared with them immediately following your visit.   A [facility] [place] [place] MyChart account gives you access to today's visit and all your visits, tests, and labs performed at [facility] " click here if you don't have a [facility] MyChart account or go to [email or web address]  Consent: (Patient) Ridhaan Dreibelbis provided verbal consent for this virtual visit at the beginning of the encounter.  Current Medications:  Current Outpatient Medications:    benzonatate (TESSALON) 100 MG capsule, Take 1-2 caps PO TID PRN, Disp: 20 capsule, Rfl: 0   acetaminophen (TYLENOL) 500 MG tablet, Take 1,000 mg by mouth every 6 (six) hours as needed for moderate pain (pain score 4-6)., Disp: , Rfl:    albuterol (PROVENTIL) (2.5 MG/3ML) 0.083% nebulizer solution, USE 1 VIAL IN NEBULIZER EVERY 6 HOURS AS NEEDED FOR WHEEZING FOR SHORTNESS OF BREATH, Disp: 180 mL, Rfl: 1   allopurinol (ZYLOPRIM) 300 MG tablet, Take 1 tablet (300 mg total) by mouth daily. (Patient taking differently: Take 300 mg by mouth in the morning.), Disp: 90 tablet, Rfl: 1   amiodarone (PACERONE) 200 MG tablet, Take 2 tablets (400 mg total) by mouth 2 (two) times daily for 5 days, THEN 1 tablet (200 mg total) 2 (two) times daily for 20 days., Disp: 60 tablet, Rfl: 0   amoxicillin-clavulanate (AUGMENTIN) 875-125 MG tablet, Take 1 tablet by mouth every 12 (twelve) hours for 3 days., Disp: 6 tablet, Rfl: 0   aspirin 81 MG chewable tablet, Chew 81 mg by mouth in the morning., Disp: , Rfl:    buPROPion (WELLBUTRIN SR) 150 MG 12 hr tablet, Take 150 mg by mouth 2 (two) times daily., Disp: , Rfl:    buPROPion (WELLBUTRIN XL) 300 MG 24 hr tablet, Take 300 mg by mouth daily. (Patient not taking: Reported on 08/27/2023),  Disp: , Rfl:    dapagliflozin propanediol (FARXIGA) 10 MG TABS tablet, Take 1 tablet (10 mg total) by mouth daily before breakfast. (Patient taking differently: Take 10 mg by mouth at bedtime.), Disp: 30 tablet, Rfl: 11   fenofibrate (TRICOR) 48 MG tablet, Take 1 tablet (48 mg total) by mouth daily. (Patient taking differently: Take 48 mg by mouth at bedtime.), Disp: 90 tablet, Rfl: 3   losartan (COZAAR) 25 MG tablet, Take 0.5 tablets (12.5 mg total) by mouth at bedtime., Disp: 45 tablet, Rfl: 3   metFORMIN (GLUCOPHAGE-XR) 500 MG 24 hr tablet, Take 1 tablet (500 mg total) by mouth daily with breakfast., Disp: 90 tablet, Rfl: 1   nicotine polacrilex (COMMIT) 2 MG lozenge, Take 2 mg by mouth every 4 (four) hours as needed for smoking cessation. (Patient not taking: Reported on 08/27/2023), Disp: , Rfl:    nicotine polacrilex (NICORETTE) 2 MG gum, Take 2 mg by mouth every 6 (six) hours., Disp: , Rfl:    nortriptyline (PAMELOR) 10 MG capsule, Take 1 capsule (10 mg total) by mouth at bedtime., Disp: 90 capsule, Rfl: 1   Omega-3 Fatty Acids (OMEGA 3 PO), Take 1,500 mg by mouth 2 (two) times daily., Disp: , Rfl:    OZEMPIC, 1 MG/DOSE, 4 MG/3ML SOPN, Inject 1 mg into the skin once a week. On Mondays, Disp: , Rfl:    potassium chloride SA (KLOR-CON M) 20 MEQ tablet, Take 20 mEq by mouth  at bedtime., Disp: , Rfl:    rosuvastatin (CRESTOR) 40 MG tablet, Take 1 tablet (40 mg total) by mouth daily. (Patient taking differently: Take 40 mg by mouth at bedtime.), Disp: 90 tablet, Rfl: 3   Spacer/Aero-Holding Chambers (AEROCHAMBER MV) inhaler, Use as instructed, Disp: 1 each, Rfl: 1   Spacer/Aero-Holding Chambers (EQ SPACE CHAMBER ANTI-STATIC) DEVI, See admin instructions., Disp: , Rfl:    Spacer/Aero-Holding Chambers DEVI, Use as directed Dx: COPD, J44.9, Disp: 1 each, Rfl: 1   spironolactone (ALDACTONE) 25 MG tablet, Take 0.5 tablets (12.5 mg total) by mouth daily. (Patient taking differently: Take 12.5 mg by mouth  at bedtime.), Disp: 45 tablet, Rfl: 3   torsemide (DEMADEX) 20 MG tablet, Take 40 mg by mouth in the morning., Disp: , Rfl:    TRELEGY ELLIPTA 100-62.5-25 MCG/ACT AEPB, Inhale 1 puff by mouth once daily (Patient taking differently: Inhale 1 puff into the lungs in the morning.), Disp: 180 each, Rfl: 1   VENTOLIN HFA 108 (90 Base) MCG/ACT inhaler, INHALE 2 PUFFS BY MOUTH EVERY 6 HOURS AS NEEDED FOR WHEEZING OR SHORTNESS OF BREATH, Disp: 54 g, Rfl: 6   warfarin (COUMADIN) 4 MG tablet, TAKE 1 TABLET BY MOUTH ONCE DAILY AT  6PM  AS  DIRECTED, Disp: , Rfl:    Medications ordered in this encounter:  Meds ordered this encounter  Medications   amoxicillin-clavulanate (AUGMENTIN) 875-125 MG tablet    Sig: Take 1 tablet by mouth every 12 (twelve) hours for 3 days.    Dispense:  6 tablet    Refill:  0    Continuation of script from 7 days to 10 days    Supervising Provider:   Merrilee Jansky [[id number]]   benzonatate (TESSALON) 100 MG capsule    Sig: Take 1-2 caps PO TID PRN    Dispense:  20 capsule    Refill:  0    Supervising Provider:   Merrilee Jansky [phone]     *If you need refills on other medications prior to your next appointment, please contact your pharmacy*  Follow-Up: Call back or seek an in-person evaluation if the symptoms worsen or if the condition fails to improve as anticipated.  [facility] [phone]  Other Instructions Upper Respiratory Infection, Adult An upper respiratory infection (URI) is a common viral infection of the nose, throat, and upper air passages that lead to the lungs. The most common type of URI is the common cold. URIs usually get better on their own, without medical treatment. What are the causes? A URI is caused by a virus. You may catch a virus by: Breathing in droplets from an infected person's cough or sneeze. Touching something that has been exposed to the virus (is contaminated) and then touching your mouth, nose, or eyes. What  increases the risk? You are more likely to get a URI if: You are very young or very old. You have close contact with others, such as at work, school, or a health care facility. You smoke. You have long-term (chronic) heart or lung disease. You have a weakened disease-fighting system (immune system). You have nasal allergies or asthma. You are experiencing a lot of stress. You have poor nutrition. What are the signs or symptoms? A URI usually involves some of the following symptoms: Runny or stuffy (congested) nose. Cough. Sneezing. Sore throat. Headache. Fatigue. Fever. Loss of appetite. Pain in your forehead, behind your eyes, and over your cheekbones (sinus pain). Muscle aches. Redness or irritation  of the eyes. Pressure in the ears or face. How is this diagnosed? This condition may be diagnosed based on your medical history and symptoms, and a physical exam. Your health care provider may use a swab to take a mucus sample from your nose (nasal swab). This sample can be tested to determine what virus is causing the illness. How is this treated? URIs usually get better on their own within 7-10 days. Medicines cannot cure URIs, but your health care provider may recommend certain medicines to help relieve symptoms, such as: Over-the-counter cold medicines. Cough suppressants. Coughing is a type of defense against infection that helps to clear the respiratory system, so take these medicines only as recommended by your health care provider. Fever-reducing medicines. Follow these instructions at home: Activity Rest as needed. If you have a fever, stay home from work or school until your fever is gone or until your health care provider says your URI cannot spread to other people (is no longer contagious). Your health care provider may have you wear a face mask to prevent your infection from spreading. Relieving symptoms Gargle with a mixture of salt and water 3-4 times a day or as  needed. To make salt water, completely dissolve -1 tsp (3-6 g) of salt in 1 cup (237 mL) of warm water. Use a cool-mist humidifier to add moisture to the air. This can help you breathe more easily. Eating and drinking  Drink enough fluid to keep your urine pale yellow. Eat soups and other clear broths. General instructions  Take over-the-counter and prescription medicines only as told by your health care provider. These include cold medicines, fever reducers, and cough suppressants. Do not use any products that contain nicotine or tobacco. These products include cigarettes, chewing tobacco, and vaping devices, such as e-cigarettes. If you need help quitting, ask your health care provider. Stay away from secondhand smoke. Stay up to date on all immunizations, including the yearly (annual) flu vaccine. Keep all follow-up visits. This is important. How to prevent the spread of infection to others URIs can be contagious. To prevent the infection from spreading: Wash your hands with soap and water for at least 20 seconds. If soap and water are not available, use hand sanitizer. Avoid touching your mouth, face, eyes, or nose. Cough or sneeze into a tissue or your sleeve or elbow instead of into your hand or into the air.  Contact a health care provider if: You are getting worse instead of better. You have a fever or chills. Your mucus is brown or red. You have yellow or brown discharge coming from your nose. You have pain in your face, especially when you bend forward. You have swollen neck glands. You have pain while swallowing. You have white areas in the back of your throat. Get help right away if: You have shortness of breath that gets worse. You have severe or persistent: Headache. Ear pain. Sinus pain. Chest pain. You have chronic lung disease along with any of the following: Making high-pitched whistling sounds when you breathe, most often when you breathe out  (wheezing). Prolonged cough (more than 14 days). Coughing up blood. A change in your usual mucus. You have a stiff neck. You have changes in your: Vision. Hearing. Thinking. Mood. These symptoms may be an emergency. Get help right away. Call 911. Do not wait to see if the symptoms will go away. Do not drive yourself to the hospital. Summary An upper respiratory infection (URI) is a common infection of  the nose, throat, and upper air passages that lead to the lungs. A URI is caused by a virus. URIs usually get better on their own within 7-10 days. Medicines cannot cure URIs, but your health care provider may recommend certain medicines to help relieve symptoms. This information is not intended to replace advice given to you by your health care provider. Make sure you discuss any questions you have with your health care provider. Document Revised: 01/19/2021 Document Reviewed: 01/19/2021 Elsevier Patient Education  2024 Elsevier Inc.   If you have been instructed to have an in-person evaluation today at a local Urgent Care facility, please use the link below. It will take you to a list of all of our available [facility], including address, phone number and hours of operation. Please do not delay care.  [facility]  If you or a family member do not have a primary care provider, use the link below to schedule a visit and establish care. When you choose a [facility] primary care physician or advanced practice provider, you gain a long-term partner in health. Find a Primary Care Provider  Learn more about [facility]'s in-office and virtual care options: [facility] - Get Care Now

## 2023-09-02 NOTE — Progress Notes (Signed)
 The patient no-showed for appointment despite this provider sending direct link,  with no response and waiting for at least 10 minutes from appointment time for patient to join. They will be marked as a NS for this appointment/time.   [name], [name]

## 2023-09-03 ENCOUNTER — Telehealth: Payer: Self-pay

## 2023-09-03 ENCOUNTER — Ambulatory Visit: Payer: Self-pay | Admitting: Family Medicine

## 2023-09-03 DIAGNOSIS — J432 Centrilobular emphysema: Secondary | ICD-10-CM

## 2023-09-03 DIAGNOSIS — E1159 Type 2 diabetes mellitus with other circulatory complications: Secondary | ICD-10-CM

## 2023-09-03 DIAGNOSIS — I38 Endocarditis, valve unspecified: Secondary | ICD-10-CM

## 2023-09-03 DIAGNOSIS — I4891 Unspecified atrial fibrillation: Secondary | ICD-10-CM

## 2023-09-03 DIAGNOSIS — I5023 Acute on chronic systolic (congestive) heart failure: Secondary | ICD-10-CM

## 2023-09-03 DIAGNOSIS — I428 Other cardiomyopathies: Secondary | ICD-10-CM

## 2023-09-03 DIAGNOSIS — M4726 Other spondylosis with radiculopathy, lumbar region: Secondary | ICD-10-CM

## 2023-09-03 DIAGNOSIS — I472 Ventricular tachycardia, unspecified: Secondary | ICD-10-CM

## 2023-09-03 DIAGNOSIS — E1122 Type 2 diabetes mellitus with diabetic chronic kidney disease: Secondary | ICD-10-CM

## 2023-09-03 DIAGNOSIS — I5022 Chronic systolic (congestive) heart failure: Secondary | ICD-10-CM

## 2023-09-03 DIAGNOSIS — I2510 Atherosclerotic heart disease of native coronary artery without angina pectoris: Secondary | ICD-10-CM

## 2023-09-03 NOTE — Telephone Encounter (Signed)
 Virtual visit scheduled with provider tomorrow. Patient was given Tessalon pears rx however these do not work for him and he is not sleeping well.

## 2023-09-03 NOTE — Transitions of Care (Post Inpatient/ED Visit) (Signed)
 09/03/2023  Name: Roberto [name] MRN: [id number] DOB: 1955/11/03  Today's TOC FU Call Status: Today's TOC FU Call Status:: Unsuccessful Call (2nd Attempt) Unsuccessful Call (1st Attempt) Date: 08/31/23 Unsuccessful Call (2nd Attempt) Date: 09/03/23 Patient's Name and Date of Birth confirmed.  Transition Care Management Follow-up Telephone Call Date of Discharge: 08/30/23 Discharge Facility: [facility]) Type of Discharge: Inpatient Admission Primary Inpatient Discharge Diagnosis:: Poorly controlled AF with RVR VT How have you been since you were released from the hospital?: Worse (now has URI symptoms) Any questions or concerns?: Yes (needs additional cough medication or cough supressant) Patient Questions/Concerns Addressed: Notified Provider of Patient Questions/Concerns (cough supressant, newINR machine, New Nebulizer, walker)  Items Reviewed: Did you receive and understand the discharge instructions provided?: Yes Medications obtained,verified, and reconciled?: Yes (Medications Reviewed) Any new allergies since your discharge?: No Dietary orders reviewed?: Yes Type of Diet Ordered:: Reg Heart Healthy Do you have support at home?: Yes People in Home: alone Name of Support/Comfort Primary Source: Selena Batten- Caregiver  Medications Reviewed Today: Medications Reviewed Today     Reviewed by Johnnette Barrios, RN (Registered Nurse) on 09/03/23 at 1458  Med List Status: <None>   Medication Order Taking? Sig Documenting Provider Last Dose Status Informant  acetaminophen (TYLENOL) 500 MG tablet [id number] Yes Take 1,000 mg by mouth every 6 (six) hours as needed for moderate pain (pain score 4-6). [provider] Taking Active Friend, Pharmacy Records, Self  albuterol (PROVENTIL) (2.5 MG/3ML) 0.083% nebulizer solution [id number] Yes USE 1 VIAL IN NEBULIZER EVERY 6 HOURS AS NEEDED FOR WHEEZING FOR SHORTNESS OF BREATH [name], [name], DO Taking Active Friend, Pharmacy Records, Self   allopurinol (ZYLOPRIM) 300 MG tablet [id number] Yes Take 1 tablet (300 mg total) by mouth daily.  Patient taking differently: Take 300 mg by mouth in the morning.   [name], DO Taking Active Friend, Pharmacy Records, Self  amiodarone (PACERONE) 200 MG tablet [id number] Yes Take 2 tablets (400 mg total) by mouth 2 (two) times daily for 5 days, THEN 1 tablet (200 mg total) 2 (two) times daily for 20 days. Graciella Freer, [name] Taking Active   amoxicillin-clavulanate (AUGMENTIN) 875-125 MG tablet [id number] Yes Take 1 tablet by mouth every 12 (twelve) hours for 3 days. Mayers, Kasandra Knudsen, [name] Taking Active            Med Note (Steffie Waggoner L   Mon Sep 03, 2023  2:50 PM) Continue for 3 more days per PCP  aspirin 81 MG chewable tablet [id number] Yes Chew 81 mg by mouth in the morning. [provider] Taking Active Friend, Pharmacy Records, Self  benzonatate (TESSALON) 100 MG capsule [id number] Yes Take 1-2 caps PO TID PRN Mayers, Cari S, [name] Taking Active   buPROPion (WELLBUTRIN SR) 150 MG 12 hr tablet [id number] No Take 150 mg by mouth 2 (two) times daily.  Patient not taking: Reported on 09/03/2023   [provider] Not Taking Active Friend, Pharmacy Records, Self  buPROPion (WELLBUTRIN XL) 300 MG 24 hr tablet [id number] Yes Take 300 mg by mouth daily. [provider] Taking Active Friend, Pharmacy Records, Self           Med Note Donell Beers Aug 27, 2023  5:32 PM) Patient has not started this medication until he finishes the 150mg  tablets  dapagliflozin propanediol (FARXIGA) 10 MG TABS tablet [id number] Yes Take 1 tablet (10 mg total) by mouth daily before breakfast.  Patient taking differently: Take  10 mg by mouth at bedtime.   Delma Freeze, FNP Taking Active Friend, Pharmacy Records, Self  fenofibrate (TRICOR) 48 MG tablet [id number] Yes Take 1 tablet (48 mg total) by mouth daily.  Patient taking differently: Take 48 mg by mouth at bedtime.    End, Cristal Deer, MD Taking Active Friend, Pharmacy Records, Self  losartan (COZAAR) 25 MG tablet [id number] Yes Take 0.5 tablets (12.5 mg total) by mouth at bedtime. Dorthula Nettles, DO Taking Active Friend, Pharmacy Records, Self  metFORMIN (GLUCOPHAGE-XR) 500 MG 24 hr tablet [id number] Yes Take 1 tablet (500 mg total) by mouth daily with breakfast. [name], DO Taking Active Friend, Pharmacy Records, Self  nicotine polacrilex (COMMIT) 2 MG lozenge [id number] No Take 2 mg by mouth every 4 (four) hours as needed for smoking cessation.  Patient not taking: Reported on 08/27/2023   [provider] Not Taking Active Friend, Pharmacy Records, Self           Med Note Donell Beers Aug 27, 2023  5:48 PM) Patient has not started this yet  nicotine polacrilex (NICORETTE) 2 MG gum [id number] Yes Take 2 mg by mouth every 6 (six) hours. [provider] Taking Active Friend, Pharmacy Records, Self  nortriptyline (PAMELOR) 10 MG capsule [id number] Yes Take 1 capsule (10 mg total) by mouth at bedtime. [name], DO Taking Active Friend, Pharmacy Records, Self  Omega-3 Fatty Acids (OMEGA 3 PO) [id number] Yes Take 1,500 mg by mouth 2 (two) times daily. [provider] Taking Active Friend, Pharmacy Records, Self  OZEMPIC, 1 MG/DOSE, 4 MG/3ML SOPN [id number] Yes Inject 1 mg into the skin once a week. On Mondays [provider] Taking Active Friend, Pharmacy Records, Self  potassium chloride SA (KLOR-CON M) 20 MEQ tablet [id number] Yes Take 20 mEq by mouth at bedtime. [provider] Taking Active Friend, Pharmacy Records, Self  rosuvastatin (CRESTOR) 40 MG tablet [id number] Yes Take 1 tablet (40 mg total) by mouth daily.  Patient taking differently: Take 40 mg by mouth at bedtime.   End, Cristal Deer, MD Taking Active Friend, Pharmacy Records, Self  Spacer/Aero-Holding Chambers (AEROCHAMBER MV) inhaler [id number]  Use as instructed Domenick Gong, MD   Active Friend, Pharmacy Records, Self  Spacer/Aero-Holding Chambers [facility] [place]) [place] [id number]  See admin instructions. [provider]  Active Friend, Pharmacy Records, Self  Spacer/Aero-Holding Rudean Curt [id number] Yes Use as directed Dx: COPD, J44.9 [name], DO Taking Active Friend, Pharmacy Records, Self  spironolactone (ALDACTONE) 25 MG tablet [id number] Yes Take 0.5 tablets (12.5 mg total) by mouth daily.  Patient taking differently: Take 12.5 mg by mouth at bedtime.   End, Cristal Deer, MD Taking Active Friend, Pharmacy Records, Self  torsemide (DEMADEX) 20 MG tablet [id number] Yes Take 40 mg by mouth in the morning. [provider] Taking Active Friend, Pharmacy Records, Self  TRELEGY ELLIPTA 100-62.5-25 MCG/ACT AEPB [id number] Yes Inhale 1 puff by mouth once daily  Patient taking differently: Inhale 1 puff into the lungs in the morning.   [name], DO Taking Active Friend, Pharmacy Records, Self  VENTOLIN HFA 108 [phone] Base) MCG/ACT inhaler [id number] Yes INHALE 2 PUFFS BY MOUTH EVERY 6 HOURS AS NEEDED FOR WHEEZING OR SHORTNESS OF BREATH [name], Oralia Rud, DO Taking Active Friend, Pharmacy Records, Self  warfarin (COUMADIN) 4 MG tablet [id number] Yes TAKE 1 TABLET BY MOUTH ONCE DAILY AT  6PM  AS  DIRECTED Graciella Freer, [name] Taking Active  Med List Note Lars Masson 08/27/23 1743): DO NOT REFILL MEDICATIONS WITHOUT AN APPOINTMENT / Friend Ms. Selena Batten assists with his medication            Home Care and Equipment/Supplies: Were Home Health Services Ordered?: No Any new equipment or medical supplies ordered?: Yes Name of Medical supply agency?: [employer] Were you able to get the equipment/medical supplies?: No (They have a call 09/04/23) Do you have any questions related to the use of the equipment/supplies?: Yes What questions do you have?: multiple  needs and items to be reordered Has call with office  09/04/23  Functional Questionnaire: Do you need assistance with bathing/showering or dressing?: Yes Do you need assistance with meal preparation?: Yes Do you need assistance with eating?: No Do you have difficulty maintaining continence: Yes Do you need assistance with getting out of bed/getting out of a chair/moving?: Yes Do you have difficulty managing or taking your medications?: Yes Selena Batten Caregiver)  Follow up appointments reviewed: PCP Follow-up appointment confirmed?: Yes Date of PCP follow-up appointment?: 09/04/23 Follow-up Provider: Olevia Perches Specialist [facility] Follow-up appointment confirmed?: Yes Date of Specialist follow-up appointment?: 09/10/23 Follow-Up Specialty Provider:: Cardiologist , Pacemaker check 3/25, GI 3/19, Do you need transportation to your follow-up appointment?: No (Virtual  visits and Caregiver transports)  SDOH Interventions Today    Flowsheet Row Most Recent Value  SDOH Interventions   Food Insecurity Interventions AMB Referral  Housing Interventions Intervention Not Indicated  Transportation Interventions Intervention Not Indicated, Patient Resources (Friends/Family)  Utilities Interventions AMB Referral       Goals Addressed             This Visit's Progress    TOC Care Plan       Current Barriers:  Medication access new medication Medication management  Diet/Nutrition/Food Resources Meal assistance  Provider appointments PCP, Cardiology and GI Equipment/DME Walker, Nebulizer , INR  Functional/Safety Falls High Risk medications  Care Coordination needs related to Financial constraints , Limited social support( no family) , Limited access to food, ADL IADL limitations, Family and relationship dysfunction, and Lacks knowledge of community resource: Chronic Disease Management support and education needs related to Atrial Fibrillation and Pulmonary Disease   RNCM Clinical Goal(s):  Patient will work with the Care Management team over  the next 30 days to address Transition of Care Barriers: Medication access Medication Management Diet/Nutrition/Food Resources Support at home Provider appointments Equipment/DME take all medications exactly as prescribed and will call provider for medication related questions as evidenced by no missed medication doses  attend all scheduled medical appointments: as evidenced by no missed follow-up visits  work with Child psychotherapist to address management of Financial constraints related to  Limited social support, Limited access to food, ADL IADL limitations, and Lacks knowledge of Publishing rights manager through collaboration with SW , provider, and care team   Work with RN CM on the management of Atrial Fibrillation as evidenced by review of EMR and  through collaboration with Medical illustrator, provider, and care team.   Interventions: Evaluation of current treatment plan related to  self management and patient's adherence to plan as established by provider  Transitions of Care:  New goal. Doctor Visits  - discussed the importance of doctor visits Troubleshoot use of DME in the home  Post discharge activity limitations prescribed by provider reviewed Reviewed Signs and symptoms of infection  AFIB Interventions: (Status:  New goal.) Short Term Goal   Reviewed importance of adherence to anticoagulant exactly as prescribed  Advised patient to discuss respiratory symptoms, need for additional equipment and supplies  with provider Counseled on bleeding risk associated with Coumadin  and importance of self-monitoring for signs/symptoms of bleeding Counseled on importance of regular laboratory monitoring as prescribed Counseled on seeking medical attention after a head injury or if there is blood in the urine/stool Screening for signs and symptoms of depression related to chronic disease state  Assessed social determinant of health barriers  Patient Goals/Self-Care Activities: Participate in Transition  of Care Program/Attend TOC scheduled calls Take all medications as prescribed Attend all scheduled provider appointments Call pharmacy for medication refills 3-7 days in advance of running out of medications Perform all self care activities independently  Perform IADL's (shopping, preparing meals, housekeeping, managing finances) independently Call provider office for new concerns or questions  Work with the social worker to address care coordination needs and will continue to work with the clinical team to address health care and disease management related needs  Follow Up Plan:  Telephone follow up appointment with care management team member scheduled for:  09/11/22 @ 11:00am  The patient has been provided with contact information for the care management team and has been advised to call with any health related questions or concerns.          Call placed to PCP office re:  need to visit , new equipment and possible referral for Home Health. Spoke with Dominique visit scheduled 09/11/23 @ 1:40pm . Attempted to call patient was not able to leave a message  Caregiver will  also contact PCP office and schedule a F2F visit    Patient is at high risk for readmission and/or has history of  high utilization  Discussed VBCI  TOC program and weekly calls to patient to assess condition/status, medication management  and provide support/education as indicated . Patient/ Caregiver voiced understanding and is  agreeable to 30 day program  Routine follow-up and on-going assessment evaluation and education of disease processes, recommended interventions for both chronic and acute medical conditions , will occur during each weekly visit along with ongoing review of symptoms ,medication reviews and reconciliation. Any updates , inconsistencies, discrepancies or acute care concerns will be addressed and routed to the correct Practitioner if indicated   Based on current information and Insurance plan -Reviewed  benefits available to patient, including details about eligibility options for care if any area of needs were identified.  Reviewed patients ability to access and / or navigating the benefits system..Amb Referral made if indicted , refer to orders section of note for details   Please refer to Care Plan for goals and interventions -Effectiveness of interventions, symptom management and outcomes will be evaluated  weekly during [facility] 30-day Program Outreach calls  . Any necessary  changes and updates to Care Plan will be completed episodically    Reviewed goals for care Patient verbalizes understanding of instructions and care plan provided. Patient was encouraged to make informed decisions about their care, actively participate in managing their health condition, and implement lifestyle changes as needed to promote independence and self-management of health care   Patient was encouraged to Contact PCP with any changes in baseline or  medication regimen,  changes in health status  /  well-being, safety concerns, including falls any questions or concerns regarding ongoing medical care, any difficulty obtaining or picking up prescriptions, any changes or worsening in condition- including  symptoms not relieved  with interventions    The patient has been provided with contact  information for the care management team and has been advised to call with any health-related questions or concerns. Follow up as indicated with Care Team , or sooner should any new problems arise.   Susa Loffler , BSN, RN [facility]   VBCI-Population Health RN Care Manager Direct Dial [phone]  Fax: [phone] Website: [email or web address]

## 2023-09-03 NOTE — Telephone Encounter (Signed)
 Copied from CRM [id number]. Topic: General - Other >> Sep 03, 2023  3:04 PM Carrielelia G wrote: Ms. Roberto [name] calling requesting a Nebulizer device and an INR machine. She said she was told by ER provider that his INR machine is not reading right and needs to be updated. Please advise

## 2023-09-03 NOTE — Telephone Encounter (Signed)
 1st attempt-left voicemail for patient to call back to office.   Copied From CRM [id number].  Reason for Triage: cough, taking benzonatate (TESSALON) 100 MG capsule ,,,medication not working

## 2023-09-03 NOTE — Patient Instructions (Addendum)
 Visit Information  Thank you for taking time to visit with me today. Please don't hesitate to contact me if I can be of assistance to you before our next scheduled telephone appointment.  Our next appointment is by telephone on 09/11/23 at 11:00 am   Following is a copy of your care plan:   Goals Addressed             This Visit's Progress    TOC Care Plan       Current Barriers:  Medication access new medication Medication management  Diet/Nutrition/Food Resources Meal assistance  Provider appointments PCP, Cardiology and GI Equipment/DME Walker, Nebulizer , INR  Functional/Safety Falls High Risk medications  Care Coordination needs related to Financial constraints , Limited social support( no family) , Limited access to food, ADL IADL limitations, Family and relationship dysfunction, and Lacks knowledge of community resource: Chronic Disease Management support and education needs related to Atrial Fibrillation and Pulmonary Disease   RNCM Clinical Goal(s):  Patient will work with the Care Management team over the next 30 days to address Transition of Care Barriers: Medication access Medication Management Diet/Nutrition/Food Resources Support at home Provider appointments Equipment/DME take all medications exactly as prescribed and will call provider for medication related questions as evidenced by no missed medication doses  attend all scheduled medical appointments: as evidenced by no missed follow-up visits  work with Child psychotherapist to address management of Financial constraints related to  Limited social support, Limited access to food, ADL IADL limitations, and Lacks knowledge of community resource through collaboration with SW , provider, and care team   Work with RN CM on the management of Atrial Fibrillation as evidenced by review of EMR and  through collaboration with Medical illustrator, provider, and care team.   Interventions: Evaluation of current treatment plan related  to  self management and patient's adherence to plan as established by provider  Transitions of Care:  New goal. Doctor Visits  - discussed the importance of doctor visits Troubleshoot use of DME in the home  Post discharge activity limitations prescribed by provider reviewed Reviewed Signs and symptoms of infection  AFIB Interventions: (Status:  New goal.) Short Term Goal   Reviewed importance of adherence to anticoagulant exactly as prescribed Advised patient to discuss respiratory symptoms, need for additional equipment and supplies  with provider Counseled on bleeding risk associated with Coumadin  and importance of self-monitoring for signs/symptoms of bleeding Counseled on importance of regular laboratory monitoring as prescribed Counseled on seeking medical attention after a head injury or if there is blood in the urine/stool Screening for signs and symptoms of depression related to chronic disease state  Assessed social determinant of health barriers  Patient Goals/Self-Care Activities: Participate in Transition of Care Program/Attend TOC scheduled calls Take all medications as prescribed Attend all scheduled provider appointments Call pharmacy for medication refills 3-7 days in advance of running out of medications Perform all self care activities independently  Perform IADL's (shopping, preparing meals, housekeeping, managing finances) independently Call provider office for new concerns or questions  Work with the social worker to address care coordination needs and will continue to work with the clinical team to address health care and disease management related needs  Follow Up Plan:  Telephone follow up appointment with care management team member scheduled for:  09/11/22 @ 11:00am  The patient has been provided with contact information for the care management team and has been advised to call with any health related questions or concerns.  Medication review   Reviewed current home medications -- provided education as needed. Patient is aware of potential side effects and was encouraged to notify PCP for any adverse side effects or unwanted symptoms not relieved with interventions  Patient will call 911 for Medical Emergencies or Life -Threatening Symptoms.  Reviewed goals for care Patient/ Caregiver verbalizes understanding of instructions with the plan of care . The  Patient / Caregiver was encouraged to make informed decisions about care, actively participate in managing health conditions, and implement lifestyle changes as needed to promote independence and self-management of healthcare. SDOH screenings have been completed and addressed if indicted.  There are no reported barriers to care.    Follow-up Plan VBCI Case Management Nurse will provide follow-up and on-going assessment ,evaluation and education of disease processes, recommended interventions for both chronic and acute medical conditions ,  along with ongoing review of symptoms ,medication reviews / reconciliation during each weekly call . Any updates , inconsistencies, discrepancies or acute care concerns will be addressed and routed to the correct Practitioner if indicated   [facility]  Please call the care guide team at [phone]  if you need to cancel or reschedule your appointment . For scheduled calls -Three attempts will be made to reach you -if the scheduled call is missed or  we are unable to reach the you after 3 attempts no additional outreach attempts will be made and the TOC follow-up will be closed .   If you need to speak to a Nurse you may  call me directly at the number below or if I am unavailable,and  your need is urgent  please call the main VBCI number at [phone] and ask to speak with one of the [facility] ( Transition of Care )  Nurses  .  Patient was encouraged to Contact PCP with any changes in baseline or  medication regimen,  changes in health status   /  well-being, safety concerns, including falls any questions or concerns regarding ongoing medical care, any difficulty obtaining or picking up prescriptions, any changes or worsening in condition- including  symptoms not relieved  with interventions                                                                            Additionally, If you experience worsening of your symptoms, develop shortness of breath, If you are experiencing a medical emergency,  develop suicidal or homicidal thoughts you must seek medical attention immediately by calling 911 or report to your local emergency department or urgent care.   If you have a non-emergency medical problem during routine business hours, please contact your provider's office and ask to speak with a nurse.       Please take the time to read instructions/literature along with the possible adverse reactions/side effects for all the Medicines that have been prescribed to you. Only take newly prescribed  Medications after you have completely understood and accept all the possible adverse reactions/side effects.   Do not take more than prescribed Medications for  Pain, Sleep and Anxiety. Do not drive when taking Pain medications or sleep aid/ insomnia  medications It is not advisable to combine anxiety, sleep and pain  medications without talking with your primary care practitioner    If you are experiencing a Mental Health or Behavioral Health Crisis or need someone to talk to Please call the Suicide and Crisis Lifeline: 988 You may also call the [place] National Suicide Prevention Lifeline: 586-373-6684 or TTY: 289-311-2734 TTY 731-513-4070) to talk to a trained counselor.  You may call the Behavioral Health Crisis Line at 613-231-9111, at any time, 24 hours a day, 7 days a week- however If you are in danger or need immediate medical attention, call 911.   If you would like help to quit smoking, call 1-800-QUIT-NOW ( 360-495-6128) OR Espaol:  1-855-Djelo-Ya (7-253-664-4034) o para ms informacin haga clic aqu or Text READY to 742-595 to register via text.   Susa Loffler , BSN, RN [place]   VBCI-Population Health RN Care Manager Direct Dial [phone]  Website: [email or web address]

## 2023-09-03 NOTE — Telephone Encounter (Signed)
 Virtual visit scheduled with Selena Batten.

## 2023-09-03 NOTE — Telephone Encounter (Signed)
  Chief Complaint: Cough, Shortness of Breath Symptoms: cough, shortness of breath, chills Frequency: since getting home from hospital Pertinent Negatives: Patient denies fever Disposition: [] ED /[] Urgent Care (no appt availability in office) / [] Appointment(In office/virtual)/ []  [facility] Virtual Care/ [] Home Care/ [x] Refused Recommended Disposition /[] [facility]/ []  Follow-up with PCP Additional Notes: patient called with complaints of cough, tessalon pearles on working, shortness of breath and chills. Patient recently came home from the hospital after having pacemaker inserted. Patient was seen yesterday for URI symptoms. Tessalon pearls are helping. Per patient's POA who was on the phone, patient is SOB with going up any stairs. Per protocol, the recommendation is for the ED. Patient refusing ED recommendation over the phone. Patient verbalized understanding and all questions answered.    Reason for Disposition  [1] MODERATE difficulty breathing (e.g., speaks in phrases, SOB even at rest, pulse 100-120) AND [2] still present when not coughing  Answer Assessment - Initial Assessment Questions 1. ONSET: "When did the cough begin?"      Cough has been going on for a couple of weeks 2. SEVERITY: "How bad is the cough today?"      9 out of 10 3. SPUTUM: "Describe the color of your sputum" (none, dry cough; clear, white, yellow, green)     dry 4. HEMOPTYSIS: "Are you coughing up any blood?" If so ask: "How much?" (flecks, streaks, tablespoons, etc.)     no 5. DIFFICULTY BREATHING: "Are you having difficulty breathing?" If Yes, ask: "How bad is it?" (e.g., mild, moderate, severe)    - MILD: No SOB at rest, mild SOB with walking, speaks normally in sentences, can lie down, no retractions, pulse < 100.    - MODERATE: SOB at rest, SOB with minimal exertion and prefers to sit, cannot lie down flat, speaks in phrases, mild retractions, audible wheezing, pulse 100-120.    - SEVERE: Very  SOB at rest, speaks in single words, struggling to breathe, sitting hunched forward, retractions, pulse > 120      Moderate 6. FEVER: "Do you have a fever?" If Yes, ask: "What is your temperature, how was it measured, and when did it start?"     No but experiencing chills 7. CARDIAC HISTORY: "Do you have any history of heart disease?" (e.g., heart attack, congestive heart failure)      pacemaker 8. LUNG HISTORY: "Do you have any history of lung disease?"  (e.g., pulmonary embolus, asthma, emphysema)      9. PE RISK FACTORS: "Do you have a history of blood clots?" (or: recent major surgery, recent prolonged travel, bedridden)     No 10. OTHER SYMPTOMS: "Do you have any other symptoms?" (e.g., runny nose, wheezing, chest pain)       wheezing 12. TRAVEL: "Have you traveled out of the country in the last month?" (e.g., travel history, exposures)       No  Protocols used: Cough - Acute Productive-A-AH

## 2023-09-04 ENCOUNTER — Ambulatory Visit: Payer: 59

## 2023-09-04 ENCOUNTER — Telehealth: Payer: Self-pay | Admitting: *Deleted

## 2023-09-04 VITALS — Ht 66.0 in | Wt 257.0 lb

## 2023-09-04 DIAGNOSIS — E1159 Type 2 diabetes mellitus with other circulatory complications: Secondary | ICD-10-CM

## 2023-09-04 DIAGNOSIS — Z0000 Encounter for general adult medical examination without abnormal findings: Secondary | ICD-10-CM | POA: Diagnosis not present

## 2023-09-04 DIAGNOSIS — Z0100 Encounter for examination of eyes and vision without abnormal findings: Secondary | ICD-10-CM

## 2023-09-04 DIAGNOSIS — H9193 Unspecified hearing loss, bilateral: Secondary | ICD-10-CM

## 2023-09-04 NOTE — Progress Notes (Signed)
 Subjective:   Roberto [name] is a 68 y.o. who presents for a Medicare Wellness preventive visit.  Visit Complete: Virtual I connected with  Roberto [name] on 09/04/23 by a audio enabled telemedicine application and verified that I am speaking with the correct person using two identifiers.  Patient Location: Home  Provider Location: Office/Clinic  I discussed the limitations of evaluation and management by telemedicine. The patient expressed understanding and agreed to proceed.  Vital Signs: Because this visit was a virtual/telehealth visit, some criteria may be missing or patient reported. Any vitals not documented were not able to be obtained and vitals that have been documented are patient reported.  VideoDeclined- This patient declined Librarian, academic. Therefore the visit was completed with audio only.  AWV Questionnaire: Yes: Patient Medicare AWV questionnaire was completed by the patient on 09/02/23; I have confirmed that all information answered by patient is correct and no changes since this date.  Cardiac Risk Factors include: advanced age (>61men, >25 women);male gender;hypertension;dyslipidemia;diabetes mellitus;obesity (BMI >30kg/m2);smoking/ tobacco exposure;Other (see comment), Risk factor comments: CAD     Objective:    Today's Vitals   09/04/23 0841  Weight: 257 lb (116.6 kg)  Height: 5\' 6"  (1.676 m)   Body mass index is 41.48 kg/m.     09/04/2023    9:13 AM 08/28/2023    8:35 AM 08/27/2023    7:31 PM 08/25/2023   11:59 AM 07/14/2023    7:28 PM 06/24/2023   11:00 AM 06/17/2023    9:11 PM  Advanced Directives  Does Patient Have a Medical Advance Directive? Yes No No No No No No  Type of Estate agent of [place];Living will        Does patient want to make changes to medical advance directive? No - Patient declined        Copy of Healthcare Power of Attorney in Chart? No - copy requested        Would patient  like information on creating a medical advance directive?  No - Patient declined No - Patient declined No - Patient declined  No - Patient declined     Current Medications (verified) Outpatient Encounter Medications as of 09/04/2023  Medication Sig   acetaminophen (TYLENOL) 500 MG tablet Take 1,000 mg by mouth every 6 (six) hours as needed for moderate pain (pain score 4-6).   albuterol (PROVENTIL) (2.5 MG/3ML) 0.083% nebulizer solution USE 1 VIAL IN NEBULIZER EVERY 6 HOURS AS NEEDED FOR WHEEZING FOR SHORTNESS OF BREATH   allopurinol (ZYLOPRIM) 300 MG tablet Take 1 tablet (300 mg total) by mouth daily. (Patient taking differently: Take 300 mg by mouth in the morning.)   amiodarone (PACERONE) 200 MG tablet Take 2 tablets (400 mg total) by mouth 2 (two) times daily for 5 days, THEN 1 tablet (200 mg total) 2 (two) times daily for 20 days.   amoxicillin-clavulanate (AUGMENTIN) 875-125 MG tablet Take 1 tablet by mouth every 12 (twelve) hours for 3 days.   aspirin 81 MG chewable tablet Chew 81 mg by mouth in the morning.   buPROPion (WELLBUTRIN XL) 300 MG 24 hr tablet Take 300 mg by mouth daily.   dapagliflozin propanediol (FARXIGA) 10 MG TABS tablet Take 1 tablet (10 mg total) by mouth daily before breakfast. (Patient taking differently: Take 10 mg by mouth at bedtime.)   fenofibrate (TRICOR) 48 MG tablet Take 1 tablet (48 mg total) by mouth daily. (Patient taking differently: Take 48 mg by mouth at bedtime.)  losartan (COZAAR) 25 MG tablet Take 0.5 tablets (12.5 mg total) by mouth at bedtime.   metFORMIN (GLUCOPHAGE-XR) 500 MG 24 hr tablet Take 1 tablet (500 mg total) by mouth daily with breakfast.   nicotine polacrilex (NICORETTE) 2 MG gum Take 2 mg by mouth every 6 (six) hours.   nortriptyline (PAMELOR) 10 MG capsule Take 1 capsule (10 mg total) by mouth at bedtime.   Omega-3 Fatty Acids (OMEGA 3 PO) Take 1,500 mg by mouth 2 (two) times daily.   OZEMPIC, 1 MG/DOSE, 4 MG/3ML SOPN Inject 1 mg into the  skin once a week. On Mondays   potassium chloride SA (KLOR-CON M) 20 MEQ tablet Take 20 mEq by mouth at bedtime.   rosuvastatin (CRESTOR) 40 MG tablet Take 1 tablet (40 mg total) by mouth daily. (Patient taking differently: Take 40 mg by mouth at bedtime.)   Spacer/Aero-Holding Chambers (AEROCHAMBER MV) inhaler Use as instructed   spironolactone (ALDACTONE) 25 MG tablet Take 0.5 tablets (12.5 mg total) by mouth daily. (Patient taking differently: Take 12.5 mg by mouth at bedtime.)   torsemide (DEMADEX) 20 MG tablet Take 40 mg by mouth in the morning.   TRELEGY ELLIPTA 100-62.5-25 MCG/ACT AEPB Inhale 1 puff by mouth once daily (Patient taking differently: Inhale 1 puff into the lungs in the morning.)   VENTOLIN HFA 108 (90 Base) MCG/ACT inhaler INHALE 2 PUFFS BY MOUTH EVERY 6 HOURS AS NEEDED FOR WHEEZING OR SHORTNESS OF BREATH   warfarin (COUMADIN) 4 MG tablet TAKE 1 TABLET BY MOUTH ONCE DAILY AT  6PM  AS  DIRECTED   benzonatate (TESSALON) 100 MG capsule Take 1-2 caps PO TID PRN (Patient not taking: Reported on 09/04/2023)   buPROPion (WELLBUTRIN SR) 150 MG 12 hr tablet Take 150 mg by mouth 2 (two) times daily. (Patient not taking: Reported on 09/04/2023)   nicotine polacrilex (COMMIT) 2 MG lozenge Take 2 mg by mouth every 4 (four) hours as needed for smoking cessation. (Patient not taking: Reported on 08/27/2023)   Spacer/Aero-Holding Chambers (EQ SPACE CHAMBER ANTI-STATIC) DEVI See admin instructions. (Patient not taking: Reported on 09/04/2023)   Spacer/Aero-Holding Rudean Curt Use as directed Dx: COPD, J44.9 (Patient not taking: Reported on 09/04/2023)   No facility-administered encounter medications on file as of 09/04/2023.    Allergies (verified) Nicotine   History: Past Medical History:  Diagnosis Date   Aneurysm (HCC)    Bicuspid aortic valve    a. s/p #27 Carbomedics mechanical valve on 03/25/2010; b. on Coumadin; c. TTE 12/17: EF 40-45%, moderately dilated LV with moderate LVH, AVR  well-seated with 14 mmHg gradient, peak AV velocity 2.5 m/s, mild mitral valve thickening with mild MR, mildly dilated RV with mildly reduced contraction   Cellulitis    CHF (congestive heart failure) (HCC)    Chronic kidney disease    Chronic systolic CHF (congestive heart failure) (HCC)    a. R/LHC 03/2010 showed no significant CAD, LVEDP 31 mmHg, mean AoV gradient 34 mmHg at rest and 47 mmHg with dobutamine 20 mcg/kg/min, AVA 1.0 cm^2, RA 31, RV 68/25, PA 68/47, PCWP 38. PA sat 65%. CO 6.2 L/min (Fick) and 5.3 L/min (thermodilution)   Clotting disorder (HCC)    COPD (chronic obstructive pulmonary disease) (HCC)    H/O mechanical aortic valve replacement 03/25/2010   a. #27 Carbomedics mechanical valve   Hearing loss    High cholesterol    HTN (hypertension)    Hypercholesterolemia    Renal infarct (HCC) 2017   Multiple  right renal infarcts, likely embolic.   Stage 3 chronic kidney disease (HCC)    Stroke [facility])    TIA (transient ischemic attack) 05/2014   Past Surgical History:  Procedure Laterality Date   AORTIC VALVE REPLACEMENT     AORTIC VALVE SURGERY     CARDIAC CATHETERIZATION  03/21/2010   No significant CAD. Severe aortic stenosis. Severely elevated left and right heart filling pressures.   CARDIAC SURGERY  2009   CHF   CARDIOVERSION N/A 08/28/2023   Procedure: CARDIOVERSION;  Surgeon: Jodelle Red, MD;  Location: [facility] INVASIVE CV LAB;  Service: Cardiovascular;  Laterality: N/A;   CARPAL TUNNEL RELEASE Left 2005   ICD IMPLANT N/A 06/25/2023   Procedure: ICD IMPLANT;  Surgeon: Marinus Maw, MD;  Location: [facility] INVASIVE CV LAB;  Service: Cardiovascular;  Laterality: N/A;   PACEMAKER IMPLANT     RIGHT HEART CATH AND CORONARY ANGIOGRAPHY N/A 01/28/2019   Procedure: RIGHT HEART CATH AND CORONARY ANGIOGRAPHY;  Surgeon: Yvonne Kendall, MD;  Location: [facility] INVASIVE CV LAB;  Service: Cardiovascular;  Laterality: N/A;   RIGHT HEART CATH AND CORONARY ANGIOGRAPHY N/A  06/20/2023   Procedure: RIGHT HEART CATH AND CORONARY ANGIOGRAPHY;  Surgeon: Yvonne Kendall, MD;  Location: [facility] INVASIVE CV LAB;  Service: Cardiovascular;  Laterality: N/A;   TONSILLECTOMY  1962   Family History  Problem Relation Age of Onset   Arthritis Mother    Dementia Mother    Colon cancer Mother    Arthritis Father    Diabetes Father    Stroke Father    Colon cancer Father    Heart attack Brother    Breast cancer Sister    Seizures Sister    Cancer Brother        brain   Heart disease Brother    Heart attack Brother    Social History   Socioeconomic History   Marital status: Divorced    Spouse name: Not on file   Number of children: 1   Years of education: 14   Highest education level: Associate degree: academic program  Occupational History   Occupation: Disabled    Employer: UNEMPLOYED  Tobacco Use   Smoking status: Every Day    Current packs/day: 0.20    Average packs/day: 0.5 packs/day for 47.7 years (23.6 ttl pk-yrs)    Types: Cigarettes    Start date: 1974    Last attempt to quit: 2021   Smokeless tobacco: Never   Tobacco comments:    3 or less cigarettes per day  Vaping Use   Vaping status: Never Used  Substance and Sexual Activity   Alcohol use: Never   Drug use: Never   Sexual activity: Not Currently  Other Topics Concern   Not on file  Social History Narrative   Not on file   Social Drivers of Health   Financial Resource Strain: Medium Risk (09/04/2023)   Overall Financial Resource Strain (CARDIA)    Difficulty of Paying Living Expenses: Somewhat hard  Food Insecurity: No Food Insecurity (09/04/2023)   Hunger Vital Sign    Worried About Running Out of Food in the Last Year: Never true    Ran Out of Food in the Last Year: Never true  Recent Concern: Food Insecurity - Food Insecurity Present (09/03/2023)   Hunger Vital Sign    Worried About Running Out of Food in the Last Year: Sometimes true    Ran Out of Food in the Last Year: Sometimes  true  Transportation Needs: No  Transportation Needs (09/03/2023)   PRAPARE - Administrator, Civil Service (Medical): No    Lack of Transportation (Non-Medical): No  Physical Activity: Inactive (09/04/2023)   Exercise Vital Sign    Days of Exercise per Week: 0 days    Minutes of Exercise per Session: 0 min  Stress: Stress Concern Present (09/04/2023)   [employer] of Occupational Health - Occupational Stress Questionnaire    Feeling of Stress : To some extent  Social Connections: Socially Isolated (09/04/2023)   Social Connection and Isolation Panel [NHANES]    Frequency of Communication with Friends and Family: Never    Frequency of Social Gatherings with Friends and Family: More than three times a week    Attends Religious Services: Never    Database administrator or Organizations: No    Attends Engineer, structural: Never    Marital Status: Divorced    Tobacco Counseling Ready to quit: Not Answered Counseling given: Not Answered Tobacco comments: 3 or less cigarettes per day    Clinical Intake:  Pre-visit preparation completed: Yes  Pain : No/denies pain     BMI - recorded: 41.48 Nutritional Status: BMI > 30  Obese Nutritional Risks: Nausea/ vomitting/ diarrhea Diabetes: Yes CBG done?: No Did pt. bring in CBG monitor from home?: No  How often do you need to have someone help you when you read instructions, pamphlets, or other written materials from your doctor or pharmacy?: 2 - Rarely  Interpreter Needed?: No  Information entered by :: Tora Kindred, CMA   Activities of Daily Living     09/04/2023    8:49 AM 09/02/2023    7:25 PM  In your present state of health, do you have any difficulty performing the following activities:  Hearing? 1 1  Comment placed referral to ENT for hearing evaluation   Vision? 1 1  Comment referred to Opthalmology   Difficulty concentrating or making decisions? 1 1  Walking or climbing stairs? 1 1  Comment uses  a standard walker   Dressing or bathing? 0 0  Doing errands, shopping? 1 1  Comment doesn't drive   Preparing Food and eating ? N N  Using the Toilet? N N  In the past six months, have you accidently leaked urine? Y Y  Comment dribbles after urination   Do you have problems with loss of bowel control? N N  Managing your Medications? Shirlean Schlein, caregiver and friend helps patient   Managing your Finances? N N  Housekeeping or managing your Housekeeping? N N    Patient Care Team: [name], DO as PCP - General (Family Medicine) End, Cristal Deer, MD as PCP - Cardiology (Cardiology) Quintella Reichert, MD as PCP - Sleep Medicine (Cardiology) Marinus Maw, MD as PCP - Electrophysiology (Cardiology)  Indicate any recent Medical Services you may have received from other than [facility] providers in the past year (date may be approximate).     Assessment:   This is a routine wellness examination for Jihad.  Hearing/Vision screen Hearing Screening - Comments:: Hearing loss, referred to ENT for evaluation Vision Screening - Comments:: Needs DM eye exam, referral placed   Goals Addressed               This Visit's Progress     Weight (lb) < 200 lb (90.7 kg) (pt-stated)   257 lb (116.6 kg)      Depression Screen     09/04/2023  9:06 AM 07/27/2023    8:05 AM 05/16/2023   10:08 PM 04/10/2023    1:23 PM 02/06/2023    9:15 AM 02/02/2023   10:42 AM 11/13/2022   10:47 AM  PHQ 2/9 Scores  PHQ - 2 Score 3 6 4 3 5 6 3   PHQ- 9 Score 6 13 15 13 15 27 10     Fall Risk     09/04/2023    9:14 AM 09/02/2023    7:25 PM 07/27/2023    8:05 AM 05/16/2023   10:10 PM 04/10/2023    1:23 PM  Fall Risk   Falls in the past year? 1 1 0 0 0  Number falls in past yr: 1 1 0 0 0  Injury with Fall? 1 0 0 0 0  Risk for fall due to : History of fall(s);Impaired balance/gait;Orthopedic patient;Impaired mobility;Impaired vision  No Fall Risks No Fall Risks No Fall Risks  Follow up Falls prevention  discussed;Falls evaluation completed;Education provided  Falls prevention discussed;Education provided;Falls evaluation completed Falls evaluation completed Falls evaluation completed    MEDICARE RISK AT HOME:  Medicare Risk at Home Any stairs in or around the home?: Yes If so, are there any without handrails?: No Home free of loose throw rugs in walkways, pet beds, electrical cords, etc?: Yes Adequate lighting in your home to reduce risk of falls?: Yes Life alert?: No Use of a cane, walker or w/c?: Yes (standard walker) Grab bars in the bathroom?: No Shower chair or bench in shower?: No Elevated toilet seat or a handicapped toilet?: No  TIMED UP AND GO:  Was the test performed?  No  Cognitive Function: 6CIT completed        09/04/2023    9:15 AM 08/21/2022    8:58 AM 07/16/2020    9:50 AM 07/11/2018   11:30 AM 07/06/2017   10:12 AM  6CIT Screen  What Year? 0 points 0 points 0 points 0 points 0 points  What month? 0 points 0 points 0 points 0 points 0 points  What time? 0 points 0 points 0 points 0 points 0 points  Count back from 20 0 points 0 points 0 points 0 points 0 points  Months in reverse 0 points 0 points 0 points 0 points 0 points  Repeat phrase 2 points 0 points 8 points 0 points 0 points  Total Score 2 points 0 points 8 points 0 points 0 points    Immunizations Immunization History  Administered Date(s) Administered   Fluad Quad(high Dose 65+) 05/11/2021, 05/29/2022   Fluad Trivalent(High Dose 65+) 04/10/2023   Influenza,inj,Quad PF,6+ Mos 04/19/2016, 05/17/2017, 04/03/2018   PFIZER Comirnaty(Gray Top)Covid-19 Tri-Sucrose Vaccine 10/17/2020   PNEUMOCOCCAL CONJUGATE-20 11/13/2022   Pneumococcal Conjugate-13 05/11/2021   Pneumococcal Polysaccharide-23 06/09/2014   Tdap 06/09/2014, 10/17/2020    Screening Tests Health Maintenance  Topic Date Due   Zoster Vaccines- Shingrix (1 of 2) Never done   Colonoscopy  Never done   OPHTHALMOLOGY EXAM  06/01/2017    HEMOGLOBIN A1C  12/17/2023   Lung Cancer Screening  02/14/2024   Diabetic kidney evaluation - Urine ACR  05/15/2024   FOOT EXAM  05/15/2024   Diabetic kidney evaluation - eGFR measurement  08/29/2024   Medicare Annual Wellness (AWV)  09/03/2024   DTaP/Tdap/Td (3 - Td or Tdap) 10/18/2030   Pneumonia Vaccine 85+ Years old  Completed   INFLUENZA VACCINE  Completed   Hepatitis C Screening  Completed   HPV VACCINES  Aged Out  COVID-19 Vaccine  Discontinued    Health Maintenance  Health Maintenance Due  Topic Date Due   Zoster Vaccines- Shingrix (1 of 2) Never done   Colonoscopy  Never done   OPHTHALMOLOGY EXAM  06/01/2017   Health Maintenance Items Addressed: Referral sent to Optometry/Ophthalmology, See Nurse Notes  Additional Screening:  Vision Screening: Recommended annual ophthalmology exams for early detection of glaucoma and other disorders of the eye.  Dental Screening: Recommended annual dental exams for proper oral hygiene  Community Resource Referral / Chronic Care Management: CRR required this visit?  No   CCM required this visit?  No     Plan:     I have personally reviewed and noted the following in the patient's chart:   Medical and social history Use of alcohol, tobacco or illicit drugs  Current medications and supplements including opioid prescriptions. Patient is not currently taking opioid prescriptions. Functional ability and status Nutritional status Physical activity Advanced directives List of other physicians Hospitalizations, surgeries, and ER visits in previous 12 months Vitals Screenings to include cognitive, depression, and falls Referrals and appointments  In addition, I have reviewed and discussed with patient certain preventive protocols, quality metrics, and best practice recommendations. A written personalized care plan for preventive services as well as general preventive health recommendations were provided to patient.      Tora Kindred, CMA   09/04/2023   After Visit Summary: (MyChart) Due to this being a telephonic visit, the after visit summary with patients personalized plan was offered to patient via MyChart   Notes:  [name], caregiver and friend present during today's visit 6 CIT Score - 2 Needs shingles vaccines Declined DM & nutrition education Placed referral for ENT to evaluate hearing loss Placed referral to Ophthalmology for DM eye exam Declined colonoscopy Needs shingles vaccines

## 2023-09-04 NOTE — Patient Instructions (Addendum)
 Roberto [name] , Thank you for taking time to come for your Medicare Wellness Visit. I appreciate your ongoing commitment to your health goals. Please review the following plan we discussed and let me know if I can assist you in the future.   Referrals/Orders/Follow-Ups/Clinician Recommendations: I have placed referrals for hearing evaluation and eye exam. Someone from each office will call you to schedule appointment. I have also included a list of eye doctor's in the area. Get the shingles vaccine at your convenience.   This is a list of the screening recommended for you and due dates:  Health Maintenance  Topic Date Due   Zoster (Shingles) Vaccine (1 of 2) Never done   Colon Cancer Screening  Never done   Eye exam for diabetics  06/01/2017   Hemoglobin A1C  12/17/2023   Screening for Lung Cancer  02/14/2024   Yearly kidney health urinalysis for diabetes  05/15/2024   Complete foot exam   05/15/2024   Yearly kidney function blood test for diabetes  08/29/2024   Medicare Annual Wellness Visit  09/03/2024   DTaP/Tdap/Td vaccine (3 - Td or Tdap) 10/18/2030   Pneumonia Vaccine  Completed   Flu Shot  Completed   Hepatitis C Screening  Completed   HPV Vaccine  Aged Out   COVID-19 Vaccine  Discontinued    Advanced directives: (Copy Requested) Please bring a copy of your health care power of attorney and living will to the office to be added to your chart at your convenience.  Next Medicare Annual Wellness Visit scheduled for next year: Yes, 09/09/24 @ 8:40AM (phone visit)  Fall Prevention in the Home, Adult Falls can cause injuries and affect people of all ages. There are many simple things that you can do to make your home safe and to help prevent falls. If you need it, ask for help making these changes. What actions can I take to prevent falls? General information Use good lighting in all rooms. Make sure to: Replace any light bulbs that burn out. Turn on lights if it is dark and use  night-lights. Keep items that you use often in easy-to-reach places. Lower the shelves around your home if needed. Move furniture so that there are clear paths around it. Do not keep throw rugs or other things on the floor that can make you trip. If any of your floors are uneven, fix them. Add color or contrast paint or tape to clearly mark and help you see: Grab bars or handrails. First and last steps of staircases. Where the edge of each step is. If you use a ladder or stepladder: Make sure that it is fully opened. Do not climb a closed ladder. Make sure the sides of the ladder are locked in place. Have someone hold the ladder while you use it. Know where your pets are as you move through your home. What can I do in the bathroom?     Keep the floor dry. Clean up any water that is on the floor right away. Remove soap buildup in the bathtub or shower. Buildup makes bathtubs and showers slippery. Use non-skid mats or decals on the floor of the bathtub or shower. Attach bath mats securely with double-sided, non-slip rug tape. If you need to sit down while you are in the shower, use a non-slip stool. Install grab bars by the toilet and in the bathtub and shower. Do not use towel bars as grab bars. What can I do in the bedroom? Make sure  that you have a light by your bed that is easy to reach. Do not use any sheets or blankets on your bed that hang to the floor. Have a firm bench or chair with side arms that you can use for support when you get dressed. What can I do in the kitchen? Clean up any spills right away. If you need to reach something above you, use a sturdy step stool that has a grab bar. Keep electrical cables out of the way. Do not use floor polish or wax that makes floors slippery. What can I do with my stairs? Do not leave anything on the stairs. Make sure that you have a light switch at the top and the bottom of the stairs. Have them installed if you do not have  them. Make sure that there are handrails on both sides of the stairs. Fix handrails that are broken or loose. Make sure that handrails are as long as the staircases. Install non-slip stair treads on all stairs in your home if they do not have carpet. Avoid having throw rugs at the top or bottom of stairs, or secure the rugs with carpet tape to prevent them from moving. Choose a carpet design that does not hide the edge of steps on the stairs. Make sure that carpet is firmly attached to the stairs. Fix any carpet that is loose or worn. What can I do on the outside of my home? Use bright outdoor lighting. Repair the edges of walkways and driveways and fix any cracks. Clear paths of anything that can make you trip, such as tools or rocks. Add color or contrast paint or tape to clearly mark and help you see high doorway thresholds. Trim any bushes or trees on the main path into your home. Check that handrails are securely fastened and in good repair. Both sides of all steps should have handrails. Install guardrails along the edges of any raised decks or porches. Have leaves, snow, and ice cleared regularly. Use sand, salt, or ice melt on walkways during winter months if you live where there is ice and snow. In the garage, clean up any spills right away, including grease or oil spills. What other actions can I take? Review your medicines with your health care provider. Some medicines can make you confused or feel dizzy. This can increase your chance of falling. Wear closed-toe shoes that fit well and support your feet. Wear shoes that have rubber soles and low heels. Use a cane, walker, scooter, or crutches that help you move around if needed. Talk with your provider about other ways that you can decrease your risk of falls. This may include seeing a physical therapist to learn to do exercises to improve movement and strength. Where to find more information Centers for Disease Control and Prevention,  STEADI: TonerPromos.no [employer] on Aging: BaseRingTones.pl National Institute on Aging: BaseRingTones.pl Contact a health care provider if: You are afraid of falling at home. You feel weak, drowsy, or dizzy at home. You fall at home. Get help right away if you: Lose consciousness or have trouble moving after a fall. Have a fall that causes a head injury. These symptoms may be an emergency. Get help right away. Call 911. Do not wait to see if the symptoms will go away. Do not drive yourself to the hospital. This information is not intended to replace advice given to you by your health care provider. Make sure you discuss any questions you have with your health  care provider. Document Revised: 02/20/2022 Document Reviewed: 02/20/2022 Elsevier Patient Education  2024 Elsevier I  There are several [employer] in your area. Here are a few that usually accept all insurance types:  [facility] [address] [place], [place] [address] Phone: [phone]  [facility] [address] [place], [place] [address] Phone: [phone]  [facility] [address] [place], [place] [address] Phone: [phone]  [facility] [address] [place], [place] [address] Phone: [phone]  [facility] [address] [place], [place] [address] Phone: [phone]  [facility] [address] [place], [place] [address] Phone: [phone]  Please let us know if you require a referral for an eye exam appointment. Thank you!

## 2023-09-04 NOTE — Progress Notes (Unsigned)
 Complex Care Management Note Care Guide Note  09/04/2023 Name: Roberto [name] MRN: [id number] DOB: 18-Nov-1955   Complex Care Management Outreach Attempts: An unsuccessful telephone outreach was attempted today to offer the patient information about available complex care management services.  Follow Up Plan:  Additional outreach attempts will be made to offer the patient complex care management information and services.   Encounter Outcome:  No Answer  Burman Nieves, CMA, Care Guide [facility]  [facility], [facility] Guide Direct Dial: [phone]  Fax: [phone] Website: [email or web address]

## 2023-09-05 ENCOUNTER — Telehealth: Payer: Self-pay | Admitting: Family Medicine

## 2023-09-05 ENCOUNTER — Encounter: Payer: Self-pay | Admitting: Pediatrics

## 2023-09-05 ENCOUNTER — Telehealth: Admitting: Pediatrics

## 2023-09-05 VITALS — BP 113/71 | HR 76 | Temp 97.6°F | Wt 257.0 lb

## 2023-09-05 DIAGNOSIS — J432 Centrilobular emphysema: Secondary | ICD-10-CM

## 2023-09-05 DIAGNOSIS — J441 Chronic obstructive pulmonary disease with (acute) exacerbation: Secondary | ICD-10-CM

## 2023-09-05 DIAGNOSIS — Z7901 Long term (current) use of anticoagulants: Secondary | ICD-10-CM | POA: Diagnosis not present

## 2023-09-05 MED ORDER — PREDNISONE 20 MG PO TABS: 40.0000 mg | ORAL_TABLET | Freq: Every day | ORAL | 0 refills | Status: AC

## 2023-09-05 MED ORDER — HYDROCOD POLI-CHLORPHE POLI ER 10-8 MG/5ML PO SUER: 5.0000 mL | Freq: Two times a day (BID) | ORAL | 0 refills | Status: DC | PRN

## 2023-09-05 MED ORDER — AMOXICILLIN-POT CLAVULANATE 875-125 MG PO TABS: 1.0000 | ORAL_TABLET | Freq: Two times a day (BID) | ORAL | 0 refills | Status: AC

## 2023-09-05 NOTE — Progress Notes (Signed)
 Telehealth Visit  I connected with  Roberto [name] on 09/14/23 by a video enabled telemedicine application and verified that I am speaking with the correct person using two identifiers.   I discussed the limitations of evaluation and management by telemedicine. The patient expressed understanding and agreed to proceed.  Subjective:    Patient ID: Roberto [name], male    DOB: 1956/05/13, 68 y.o.   MRN: [id number]  HPI: Roberto [name] is a 68 y.o. male  Chief Complaint  Patient presents with   URI    Coughing but nothing coming up. Covid negative. Can't sleep due to cough. Sore throat    Discussed the use of AI scribe software for clinical note transcription with the patient, who gave verbal consent to proceed.  History of Present Illness   Roberto [name] is a 68 year old male who presents with persistent cough and associated symptoms.  He has been experiencing a persistent cough accompanied by body aches, sore throat, and occasional headaches, particularly after coughing. He also notes feeling cold at times. These symptoms have persisted despite being on a five-day course of Augmentin, with one day remaining. The cough is described as producing a rattling sound, but no sputum is expectorated. He also experiences dry heaves. No fevers have been noted, and he has tested negative for COVID-19. He has not been tested for the flu but has received both the flu and pneumonia vaccines.  He has been using Tessalon Perles for the cough, but they are ineffective. He has previously used Tussin with codeine, which helped him rest. Currently, he is taking Safe Tussin, but it is not alleviating the cough.  He does not use supplemental oxygen but performs breathing treatments, which provide some relief. However, he mentions that his nebulizer machine is over ten years old and may need replacement.  He also mentions the need for a new INR machine due to concerns about its accuracy. The current  machine has been in use since before the COVID-19 pandemic.      INR, nebulizer machine - needs new one  Relevant past medical, surgical, family and social history reviewed and updated as indicated. Interim medical history since our last visit reviewed. Allergies and medications reviewed and updated.  ROS per HPI unless specifically indicated above     Objective:    BP 113/71 (BP Location: Right Arm, Patient Position: Sitting, Cuff Size: Large)   Pulse 76   Temp 97.6 F (36.4 C) (Oral)   Wt 257 lb (116.6 kg)   SpO2 94%   BMI 41.48 kg/m   Wt Readings from Last 3 Encounters:  09/11/23 251 lb 9.6 oz (114.1 kg)  09/10/23 250 lb 9.6 oz (113.7 kg)  09/05/23 257 lb (116.6 kg)     Physical Exam Constitutional:      General: He is not in acute distress.    Appearance: He is normal weight. He is ill-appearing. He is not toxic-appearing.  HENT:     Nose: Congestion present.  Pulmonary:     Effort: Pulmonary effort is normal.  Neurological:     General: No focal deficit present.     Mental Status: He is alert. Mental status is at baseline.  Psychiatric:        Mood and Affect: Mood normal.        Behavior: Behavior normal.      LIMITED EXAM GIVEN VIDEO VISIT     Assessment & Plan:  Assessment & Plan   COPD exacerbation (HCC) Centrilobular  emphysema (HCC) Symptoms persist despite Augmentin and ineffective Tessalon Perles and Safe Tussin. Breathing treatments provide some relief. Prednisone recommended for inflammation. Tussin with codeine suggested for cough and sleep. Considered extending antibiotics. Pt given strict return precautions and anticipatory guidance for ED. - Extend Augmentin for 2 more days.  - Prescribe prednisone. - Prescribe Tussin with codeine. - Facilitate new nebulizer machine acquisition. -     predniSONE; Take 2 tablets (40 mg total) by mouth daily with breakfast for 5 days.  Dispense: 10 tablet; Refill: 0 -     Amoxicillin-Pot Clavulanate; Take 1  tablet by mouth 2 (two) times daily for 4 days.  Dispense: 8 tablet; Refill: 0    Anticoagulated on Coumadin INR machine outdated and potentially inaccurate. ER advised update. - Facilitate new INR machine acquisition.   Follow up plan: Return if symptoms worsen or fail to improve.  Jackolyn Confer, MD   This visit was completed via video visit through MyChart due to the restrictions of the COVID-19 pandemic. All issues as above were discussed and addressed. Physical exam was done as above through visual confirmation on video through MyChart. If it was felt that the patient should be evaluated in the office, they were directed there. The patient verbally consented to this visit."} Location of the patient: home Location of the provider: work Those involved with this call:  Provider: Modena Nunnery, MD CMA:  Babs Bertin, CMA Time spent on call:  15 minutes with patient face to face via video conference. More than 50% of this time was spent in counseling and coordination of care. 15 minutes total spent in review of patient's record and preparation of their chart. Total time spent on this encounter: 30 minutes.

## 2023-09-05 NOTE — Telephone Encounter (Signed)
 He got out before this could be done. Lets plan to have the amio level drawn the next time he is back in the office.

## 2023-09-05 NOTE — Progress Notes (Signed)
 Cardiology Office Note:  .   Date:  09/05/2023  ID:  Roberto [name], DOB 05-Dec-1955, MRN [id number] PCP: [name], DO  [facility] Providers Cardiologist:  Yvonne Kendall, MD Electrophysiologist:  Lewayne Bunting, MD  Sleep Medicine:  Armanda Magic, MD {  History of Present Illness: .   Dimetrius Montfort is a 68 y.o. male w/PMHx of VHD (s/p mechanical AVR 2016), no obst CAD, TIA/CVA, renal infarct, ascending Ao aneurysm, chronic CHF (systolic), VT  Admitted 06/17/23 via [facility] found in sustain MMVT > amiodarone > AFib > MCH R/LHC: nonobstructive CAD, severely elevated filling pressures and Fick CI 1.9.  Echo with EF down to 25%, RV okay and severe TR  cMRI was performed which showed EF 20% and mod LAE and severe RAE. Mild MR. Mod TR. Severe LVE and mild RVE Diuresed via HF team ICD insertion 06/25/23  Recurrent VT during implant >> ATP through device, successful in termination the VT  Remained in rate controlled AFIB Planned for amio 400 BID until 12/26 then 400 mg daily  Discharged 06/26/23 VT suspect 2/2 decompensated HF  Seeing EP and HF teams since then   Had been planned for DCCV 08/24/23 > cancelled with symptoms of URI  ER visit 08/25/23, with progressive palpitations, SOB as well as INR (home checks) of 8 Found in VT, hemodynamically stable in the 130's Zone made rate 125 to deliver ATP >> then was successfully ATP > back to AFib Discussed plans to reschedule DCCV for the AFib No changes to his amio Discharged from the ER  Admitted 08/27/23, AFib RVR > associated with in appropriate device therapies >> amiodarone gtt  Slow VT, ATPs during his stay and discussed likely dual tachycardia, though primarily AFib DCCV to SR/accel junctional rhythm 2/25 Transitioned to PO amio Discharged 08/30/23 Today's visit is scheduled as a post hospital f/u  ROS:   He is accompanied by his wife today Thinks he caught a cold in the hospital, on amoxicillin via his PMD, for URI.   Today is his last day, c/w cough and sinus congestion Otherwise reports doing fine No CP, palpitations or cardiac awareness No dizzy spells, near syncope or syncope No shocks No bleeding or signs of bleeding  They do home testing INRs, PMD office manages  Device information Abbott single chamber ICD implanted 06/25/23  Arrhythmia/AAD hx VT and Afib Dec 2024 Amiodarone started Dec 2024  Studies Reviewed: Marland Kitchen    EKG done today and reviewed  SR 71bpm, LAD, LBBB  DEVICE interrogation done today and reviewed by myself Battery and lead measurements are good No VT or therapies RV lead output reduced to 2.5V VP <1%  ECHO: 06/20/23: LVEF 20-25%, moderately reduced RV function.    CATH: 06/20/23: Mild to moderate, non-obstructive coronary artery disease with 60% D1 stenosis and 20% mid LAD and proximal RCA lesions.  No significant change noted from prior angiogram in 2020. Moderately elevated left heart filling pressure (PCWP 30 mmHg with prominent v-waves). Severely elevated right heart filling pressure (RA 25, RVEDP 18 mmHg). Moderate pulmonary hypertension (PA 50/28, mean 35 mmHg). Moderately reduced Fick cardiac output/index (CO 4.4 L/min, CI 1.9 L/min/m^2).   CMR:  06/25/23:  1. Severe LVE with global hypokinesis LVEF 20%  2. Delayed gadolinium images with mid myocardial basal septal uptake not consistent with CAD  3.  Mild RVE with hypokinesis RVEF 41%  5. Severe ascending thoracic aneurysm measuring 5.5 cm Patient does not appear to have had Bentall procedure during his AVR  Note non contrast chest CT done 03/17/23 measured this at 5.9 cm He has been seen by Dr Laneta Simmers CVTS on 02/14/23 and not thought to be a redo candidate due to co morbidities 7. Bileaflet 27 mm AVR with carbomedics valve mild closing volume AR and normal leaflet motion   Risk Assessment/Calculations:    Physical Exam:   VS:  There were no vitals taken for this visit.   Wt Readings from Last 3  Encounters:  09/04/23 257 lb (116.6 kg)  08/28/23 273 lb 9.5 oz (124.1 kg)  08/25/23 260 lb 2.3 oz (118 kg)    GEN: Well nourished, well developed in no acute distress NECK: No JVD; No carotid bruits CARDIAC: RRR, no murmurs, rubs, gallops RESPIRATORY: CTA b/l without rales, wheezing or rhonchi  ABDOMEN: Soft, non-tender, non-distended EXTREMITIES: trace edema; No deformity   ICD site: stable, well healed, no erythema, edema, fluctuation, tethering   ASSESSMENT AND PLAN: .    ICD intact function RV lead output reduced to chronic outputs   VT On amiodarone None further since home Continue 200mg  BID until 3/30 > 200mg  daily Labs today   (Likely Persistent) AFib CHA2DS2Vasc is 4, on warfarin INRs managed with his PMD No bleeding or signs of bleeding reported  VHD w/mechanical AVR NICM C/w HF clinic [facility]  Sees them soon No exam findings of volume OL  Secondary hypercoagulable state 2/2 AFib    7. Cough, sinus congestion Will get CXR though lungs sound very clear On abx via his PMD Doubt volume OL   Dispo: will move his 91 day out > back with EP in 33mo, sooner if needed  Signed, Sheilah Pigeon, [name]

## 2023-09-05 NOTE — Telephone Encounter (Signed)
 Copied from CRM [id number]. Topic: Clinical - Prescription Issue >> Sep 05, 2023  3:03 PM Shon Hale wrote: Reason for CRM: Selena Batten calling on behalf of patient, chlorpheniramine-HYDROcodone (TUSSIONEX) 10-8 MG/5ML is not covered by insurance patient would have to pay $60 out of pocket.   Informed by pharmacist that Guaifenesin/codeine would be covered by insurance, however that [facility] does not have the rx in stock.   [name] requesting an alternative to be sent in.

## 2023-09-05 NOTE — Progress Notes (Signed)
 Complex Care Management Note  Care Guide Note 09/05/2023 Name: Roberto [name] MRN: [id number] DOB: 11/24/1955  Roberto [name] is a 68 y.o. year old male who sees [name], DO for primary care. I reached out to [name] by phone today to offer complex care management services.  Roberto [name] was given information about Complex Care Management services today including:   The Complex Care Management services include support from the care team which includes your Nurse Care Manager, Clinical Social Worker, or Pharmacist.  The Complex Care Management team is here to help remove barriers to the health concerns and goals most important to you. Complex Care Management services are voluntary, and the patient may decline or stop services at any time by request to their care team member.   Complex Care Management Consent Status: Patient agreed to services and verbal consent obtained.   Follow up plan:  Telephone appointment with complex care management team member scheduled for:  09/17/2023  Encounter Outcome:  Patient Scheduled  Burman Nieves, CMA, Care Guide [facility]  [facility], [facility] Guide Direct Dial: [phone]  Fax: [phone] Website: [email or web address]

## 2023-09-10 ENCOUNTER — Ambulatory Visit: Payer: Self-pay | Admitting: Family Medicine

## 2023-09-10 ENCOUNTER — Encounter: Payer: Self-pay | Admitting: Physician Assistant

## 2023-09-10 ENCOUNTER — Ambulatory Visit: Payer: 59 | Attending: Physician Assistant | Admitting: Physician Assistant

## 2023-09-10 ENCOUNTER — Ambulatory Visit
Admission: RE | Admit: 2023-09-10 | Discharge: 2023-09-10 | Disposition: A | Discharge status: Home / Self Care | Source: Ambulatory Visit | Attending: Physician Assistant

## 2023-09-10 ENCOUNTER — Other Ambulatory Visit
Admission: RE | Admit: 2023-09-10 | Discharge: 2023-09-10 | Disposition: A | Discharge status: Home / Self Care | Source: Ambulatory Visit | Attending: Physician Assistant | Admitting: Physician Assistant

## 2023-09-10 VITALS — BP 102/68 | HR 68 | Ht 66.0 in | Wt 250.6 lb

## 2023-09-10 DIAGNOSIS — Z79899 Other long term (current) drug therapy: Secondary | ICD-10-CM

## 2023-09-10 DIAGNOSIS — Z952 Presence of prosthetic heart valve: Secondary | ICD-10-CM

## 2023-09-10 DIAGNOSIS — I4819 Other persistent atrial fibrillation: Secondary | ICD-10-CM

## 2023-09-10 DIAGNOSIS — I472 Ventricular tachycardia, unspecified: Secondary | ICD-10-CM

## 2023-09-10 DIAGNOSIS — R0602 Shortness of breath: Secondary | ICD-10-CM

## 2023-09-10 DIAGNOSIS — Z95810 Presence of automatic (implantable) cardiac defibrillator: Secondary | ICD-10-CM | POA: Diagnosis not present

## 2023-09-10 DIAGNOSIS — I428 Other cardiomyopathies: Secondary | ICD-10-CM

## 2023-09-10 LAB — CUP PACEART INCLINIC DEVICE CHECK
Battery Remaining Longevity: 121 mo
Brady Statistic RV Percent Paced: 0 %
Date Time Interrogation Session: 20250310095434
HighPow Impedance: 79.875
Implantable Lead Connection Status: 753985
Implantable Lead Implant Date: 20241223
Implantable Lead Location: 753860
Implantable Lead Model: 7122
Implantable Pulse Generator Implant Date: 20241223
Lead Channel Impedance Value: 512.5 Ohm
Lead Channel Pacing Threshold Amplitude: 0.5 V
Lead Channel Pacing Threshold Amplitude: 0.5 V
Lead Channel Pacing Threshold Pulse Width: 0.5 ms
Lead Channel Pacing Threshold Pulse Width: 0.5 ms
Lead Channel Sensing Intrinsic Amplitude: 12 mV
Lead Channel Setting Pacing Amplitude: 2.5 V
Lead Channel Setting Pacing Pulse Width: 0.5 ms
Lead Channel Setting Sensing Sensitivity: 0.5 mV
Pulse Gen Serial Number: 111074223

## 2023-09-10 LAB — COMPREHENSIVE METABOLIC PANEL
ALT: 77 U/L — ABNORMAL HIGH (ref 0–44)
AST: 71 U/L — ABNORMAL HIGH (ref 15–41)
Albumin: 4.1 g/dL (ref 3.5–5.0)
Alkaline Phosphatase: 68 U/L (ref 38–126)
Anion gap: 12 (ref 5–15)
BUN: 29 mg/dL — ABNORMAL HIGH (ref 8–23)
CO2: 28 mmol/L (ref 22–32)
Calcium: 9.2 mg/dL (ref 8.9–10.3)
Chloride: 102 mmol/L (ref 98–111)
Creatinine, Ser: 1.44 mg/dL — ABNORMAL HIGH (ref 0.61–1.24)
GFR, Estimated: 53 mL/min — ABNORMAL LOW (ref >60)
Glucose, Bld: 122 mg/dL — ABNORMAL HIGH (ref 70–99)
Potassium: 3.9 mmol/L (ref 3.5–5.1)
Sodium: 142 mmol/L (ref 135–145)
Total Bilirubin: 0.9 mg/dL (ref 0.0–1.2)
Total Protein: 8.1 g/dL (ref 6.5–8.1)

## 2023-09-10 LAB — TSH: TSH: 0.919 u[IU]/mL (ref 0.350–4.500)

## 2023-09-10 NOTE — Patient Instructions (Addendum)
 Medication Instructions:   CONTINUE TAKING :  AMIODARONE 200 MG TWICE A DAY  09-30-23   THE START TAKING : AMIODARONE 200 MG ONCE A DAY   *If you need a refill on your cardiac medications before your next appointment, please call your pharmacy*   Lab Work:   PLEASE GO DOWN STAIRS  [facility]  FIRST FLOOR  [address] ( GET OFF ELEVATORS MAKE A LEFT AND ANOTHER LEFT LAB ON RIGHT DOWN HALLWAY : CMET AND TSH TODAY    If you have labs (blood work) drawn today and your tests are completely normal, you will receive your results only by: MyChart Message (if you have MyChart) OR A paper copy in the mail If you have any lab test that is abnormal or we need to change your treatment, we will call you to review the results.   Testing/Procedures: A chest x-ray takes a picture of the organs and structures inside the chest, including the heart, lungs, and blood vessels. This test can show several things, including, whether the heart is enlarges; whether fluid is building up in the lungs; and whether pacemaker / defibrillator leads are still in place.  Address: [address] [place], [place], [place] [address] Phone: [phone] Hours:  Monday 6:30?AM-7?PM Tuesday 6:30?AM-7?PM Wednesday 6:30?AM-7?PM Thursday 6:30?AM-7?PM Friday 6:30?AM-7?PM Saturday 6:30?AM-7?PM Sunday 6:30?AM-7?PM    Follow-Up: At [facility], you and your health needs are our priority.  As part of our continuing mission to provide you with exceptional heart care, we have created designated Provider Care Teams.  These Care Teams include your primary Cardiologist (physician) and Advanced Practice Providers (APPs -  Physician Assistants and Nurse Practitioners) who all work together to provide you with the care you need, when you need it.  We recommend signing up for the patient portal called "MyChart".  Sign up information is provided on this After Visit Summary.  MyChart is used to connect with patients for Virtual Visits  (Telemedicine).  Patients are able to view lab/test results, encounter notes, upcoming appointments, etc.  Non-urgent messages can be sent to your provider as well.   To learn more about what you can do with MyChart, go to [email or web address].    Your next appointment:  ( MOVE OUT APPOINTMENT) 3 month(s)  Provider:   Sherie Don, NP    Other Instructions    1st Floor: - Lobby - Registration  - Pharmacy  - Lab - Cafe  2nd Floor: - PV Lab - Diagnostic Testing (echo, CT, nuclear med)  3rd Floor: - Vacant  4th Floor: - TCTS (cardiothoracic surgery) - AFib Clinic - Structural Heart Clinic - Vascular Surgery  - Vascular Ultrasound  5th Floor: - HeartCare Cardiology (general and EP) - Clinical Pharmacy for coumadin, hypertension, lipid, weight-loss medications, and med management appointments    Valet parking services will be available as well.

## 2023-09-10 NOTE — Progress Notes (Signed)
 This encounter was created in error - please disregard.  This encounter was created in error - please disregard.

## 2023-09-10 NOTE — Telephone Encounter (Signed)
 This encounter was created in error - please disregard.

## 2023-09-10 NOTE — Progress Notes (Signed)
 Patient has an appt on 09/10/2023. At 1:40

## 2023-09-10 NOTE — Progress Notes (Signed)
 Extrenal INR results entered for patient. Sent to PCP for review.

## 2023-09-11 ENCOUNTER — Ambulatory Visit (INDEPENDENT_AMBULATORY_CARE_PROVIDER_SITE_OTHER): Admitting: Family Medicine

## 2023-09-11 ENCOUNTER — Encounter: Payer: Self-pay | Admitting: Family Medicine

## 2023-09-11 ENCOUNTER — Other Ambulatory Visit: Payer: Self-pay

## 2023-09-11 VITALS — BP 88/63 | HR 74 | Temp 98.9°F | Resp 17 | Ht 65.98 in | Wt 251.6 lb

## 2023-09-11 DIAGNOSIS — I700 Atherosclerosis of aorta: Secondary | ICD-10-CM | POA: Diagnosis not present

## 2023-09-11 DIAGNOSIS — I152 Hypertension secondary to endocrine disorders: Secondary | ICD-10-CM

## 2023-09-11 DIAGNOSIS — Z7985 Long-term (current) use of injectable non-insulin antidiabetic drugs: Secondary | ICD-10-CM

## 2023-09-11 DIAGNOSIS — J432 Centrilobular emphysema: Secondary | ICD-10-CM

## 2023-09-11 DIAGNOSIS — M1A9XX0 Chronic gout, unspecified, without tophus (tophi): Secondary | ICD-10-CM

## 2023-09-11 DIAGNOSIS — I38 Endocarditis, valve unspecified: Secondary | ICD-10-CM

## 2023-09-11 DIAGNOSIS — B370 Candidal stomatitis: Secondary | ICD-10-CM

## 2023-09-11 DIAGNOSIS — E1159 Type 2 diabetes mellitus with other circulatory complications: Secondary | ICD-10-CM | POA: Diagnosis not present

## 2023-09-11 DIAGNOSIS — R791 Abnormal coagulation profile: Secondary | ICD-10-CM

## 2023-09-11 DIAGNOSIS — Z950 Presence of cardiac pacemaker: Secondary | ICD-10-CM

## 2023-09-11 LAB — MICROALBUMIN, URINE WAIVED
Creatinine, Urine Waived: 50 mg/dL (ref 10–300)
Microalb, Ur Waived: 80 mg/L — ABNORMAL HIGH (ref 0–19)

## 2023-09-11 LAB — BAYER DCA HB A1C WAIVED: HB A1C (BAYER DCA - WAIVED): 6.1 % — ABNORMAL HIGH (ref 4.8–5.6)

## 2023-09-11 MED ORDER — WARFARIN SODIUM 3 MG PO TABS: 3.0000 mg | ORAL_TABLET | Freq: Every day | ORAL | 3 refills | Status: DC

## 2023-09-11 MED ORDER — WARFARIN SODIUM 1 MG PO TABS: 0.5000 mg | ORAL_TABLET | Freq: Every day | ORAL | 2 refills | Status: DC

## 2023-09-11 MED ORDER — NYSTATIN 100000 UNIT/ML MT SUSP: OROMUCOSAL | 1 refills | Status: DC

## 2023-09-11 NOTE — Progress Notes (Unsigned)
 BP (!) 88/63 (BP Location: Left Arm, Patient Position: Sitting, Cuff Size: Large)   Pulse 74   Temp 98.9 F (37.2 C) (Oral)   Resp 17   Ht 5' 5.98" (1.676 m)   Wt 251 lb 9.6 oz (114.1 kg)   SpO2 99%   BMI 40.63 kg/m    Subjective:    Patient ID: Roberto [name], male    DOB: [birth year], 68 y.o.   MRN: [id number]  HPI: Roberto [name] is a 68 y.o. male  Chief Complaint  Patient presents with   Hospitalization Follow-up   Transition of Care Hospital Follow up.   Hospital/Facility: [facility] Physician: Dr. Lalla Brothers D/C Date: 08/30/23  Records Requested: 09/11/23 Records Received:  09/11/23 Records Reviewed:  09/11/23  Diagnoses on Discharge: Poorly controlled AF with RVR VT Elevated INR  Acute on chronic systolic CHF H/o Mechanical MV  Date of interactive Contact within 48 hours of discharge: 08/31/23 Contact was through: phone  Date of 7 day or 14 day face-to-face visit: 09/11/23   within 14 days  Outpatient Encounter Medications as of 09/11/2023  Medication Sig Note   nystatin (MYCOSTATIN) 100000 UNIT/ML suspension 5mL swish and spit TID    warfarin (COUMADIN) 1 MG tablet Take 0.5 tablets (0.5 mg total) by mouth daily.    warfarin (COUMADIN) 3 MG tablet Take 1 tablet (3 mg total) by mouth daily.    acetaminophen (TYLENOL) 500 MG tablet Take 1,000 mg by mouth every 6 (six) hours as needed for moderate pain (pain score 4-6).    albuterol (PROVENTIL) (2.5 MG/3ML) 0.083% nebulizer solution USE 1 VIAL IN NEBULIZER EVERY 6 HOURS AS NEEDED FOR WHEEZING FOR SHORTNESS OF BREATH    allopurinol (ZYLOPRIM) 300 MG tablet Take 1 tablet (300 mg total) by mouth daily.    amiodarone (PACERONE) 200 MG tablet Take 2 tablets (400 mg total) by mouth 2 (two) times daily for 5 days, THEN 1 tablet (200 mg total) 2 (two) times daily for 20 days. (Patient taking differently: Take 1 tablets (200 mg total) by mouth 2 (two) times daily  until  09-30-23, THEN 1 tablet (200 mg total)  daily )     aspirin 81 MG chewable tablet Chew 81 mg by mouth in the morning.    buPROPion (WELLBUTRIN XL) 300 MG 24 hr tablet Take 300 mg by mouth daily. 08/27/2023: Patient has not started this medication until he finishes the 150mg  tablets   dapagliflozin propanediol (FARXIGA) 10 MG TABS tablet Take 1 tablet (10 mg total) by mouth daily before breakfast.    fenofibrate (TRICOR) 48 MG tablet Take 1 tablet (48 mg total) by mouth daily.    losartan (COZAAR) 25 MG tablet Take 0.5 tablets (12.5 mg total) by mouth at bedtime.    metFORMIN (GLUCOPHAGE-XR) 500 MG 24 hr tablet Take 1 tablet (500 mg total) by mouth daily with breakfast.    nortriptyline (PAMELOR) 10 MG capsule Take 1 capsule (10 mg total) by mouth at bedtime.    Omega-3 Fatty Acids (OMEGA 3 PO) Take 1,500 mg by mouth 2 (two) times daily.    OZEMPIC, 1 MG/DOSE, 4 MG/3ML SOPN Inject 1 mg into the skin once a week. On Mondays    potassium chloride SA (KLOR-CON M) 20 MEQ tablet Take 20 mEq by mouth at bedtime.    rosuvastatin (CRESTOR) 40 MG tablet Take 1 tablet (40 mg total) by mouth daily.    Spacer/Aero-Holding Chambers (AEROCHAMBER MV) inhaler Use as instructed    Spacer/Aero-Holding  [facility] Use as directed Dx: COPD, J44.9    spironolactone (ALDACTONE) 25 MG tablet Take 0.5 tablets (12.5 mg total) by mouth daily.    torsemide (DEMADEX) 20 MG tablet Take 40 mg by mouth in the morning.    TRELEGY ELLIPTA 100-62.5-25 MCG/ACT AEPB Inhale 1 puff by mouth once daily    VENTOLIN HFA 108 (90 Base) MCG/ACT inhaler INHALE 2 PUFFS BY MOUTH EVERY 6 HOURS AS NEEDED FOR WHEEZING OR SHORTNESS OF BREATH    [DISCONTINUED] benzonatate (TESSALON) 100 MG capsule Take 1-2 caps PO TID PRN    [DISCONTINUED] chlorpheniramine-HYDROcodone (TUSSIONEX) 10-8 MG/5ML Take 5 mLs by mouth every 12 (twelve) hours as needed for cough.    [DISCONTINUED] nicotine polacrilex (COMMIT) 2 MG lozenge Take 2 mg by mouth every 4 (four) hours as needed for smoking cessation. 08/27/2023:  Patient has not started this yet   [DISCONTINUED] nicotine polacrilex (NICORETTE) 2 MG gum Take 2 mg by mouth every 6 (six) hours.    [DISCONTINUED] Spacer/Aero-Holding Chambers (EQ SPACE CHAMBER ANTI-STATIC) DEVI See admin instructions.    [DISCONTINUED] warfarin (COUMADIN) 4 MG tablet TAKE 1 TABLET BY MOUTH ONCE DAILY AT  6PM  AS  DIRECTED    No facility-administered encounter medications on file as of 09/11/2023.  Per Hospitalist: "Brief HPI: Roberto [name] is a 68 y.o. male with a history of persistent AF with RVR, VT, chronic systolic CHF, OSA, and mechanical aortic valve admitted for appropriate and inappropriate ICD therapies.    Hospital Course:  The patient was admitted with multiple episodes of ATP. Some felt to be true sustained VT and others with AF and RVR. Therapy zones adjusted to try and prevent inappropriate shocks.  Pt loaded on IV amiodarone and underwent DCC to NSR 08/28/2023 by Dr. Cristal Deer. He was loaded further on IV amiodarone and transitioned to po on day of discharge, with close follow up planned as below.  They were monitored on telemetry overnight which demonstrated no further sustained VT or AF s/p DCC.  Pt had multiple teachings re: INR, but caregiver stated "we are going to keep doing what we've been doing" despite multiple high INRs.   The patient was examined and considered to be stable for discharge.  The patient will be seen back by EP APP in approx 2 weeks for post hospital care.    Pt will take amiodarone 400 mg BID x 5 days, then 200 mg BID until follow up.    Pharmacy recommendation is currently to take coumadin 4 mg DAILY as adjust as needed."   Diagnostic Tests Reviewed:  CLINICAL DATA:  Tachycardia.   EXAM: PORTABLE CHEST 1 VIEW   COMPARISON:  June 26, 2023.   FINDINGS: Stable cardiomegaly. Status post sternotomy. Single lead left-sided pacemaker is again noted. Both lungs are clear. The visualized skeletal structures are unremarkable.    IMPRESSION: No active disease.   CLINICAL DATA:  Chest pain after ICD fired 5 times today   EXAM: CHEST - 2 VIEW   COMPARISON:  08/25/2023   FINDINGS: Frontal and lateral views of the chest demonstrate postsurgical changes from median sternotomy. Single lead AICD tip within the region of the right ventricle. Aortic valve prosthesis again noted. The cardiac silhouette is enlarged but stable. No airspace disease, effusion, or pneumothorax. No acute bony abnormalities.   IMPRESSION: 1. Stable chest, no acute process.  Disposition: Home  Consults: Cardiology, had cardioversion  Discharge Instructions: Follow up with cardiology and here  Disease/illness Education: Discussed today  Home Health/Community  Services Discussions/Referrals: Architect or re-establishment of referral orders for community resources: Home health placed today  Discussion with other health care providers: N/A  Assessment and Support of treatment regimen adherence: Good  Appointments Coordinated with: Patient and friend  Education for self-management, independent living, and ADLs: Discussed today  Since getting out of the hospital, Roberto [name] has been feeling really tired and not feeling like himself.   Coumadin Management.  The expected duration of coumadin treatment is lifelong The reason for anticoagulation is  mechanical heart valve.  Present Coumadin dose: 4mg  daily Goal: 2.5-3.5 2.5-3.5 Excessive bruising: no Nose bleeding: no Rectal bleeding: no Prolonged menstrual cycles: N/A Eating diet with consistent amounts of foods containing Vitamin K:yes Any recent antibiotic use? yes  HYPERTENSION / HYPERLIPIDEMIA Satisfied with current treatment? yes Duration of hypertension: chronic BP monitoring frequency: a few times a month BP medication side effects: no Past BP meds: torsemide, spironalactone, losartan,  Duration of hyperlipidemia: chronic Cholesterol medication side  effects: no Cholesterol supplements: none Past cholesterol medications: crestor, fenofibtrate Medication compliance: excellent compliance Aspirin: no Recent stressors: no Recurrent headaches: no Visual changes: no Palpitations: no Dyspnea: no Chest pain: no Lower extremity edema: no Dizzy/lightheaded: no  DIABETES Hypoglycemic episodes:no Polydipsia/polyuria: no Visual disturbance: no Chest pain: no Paresthesias: no Glucose Monitoring: no Taking Insulin?: no Blood Pressure Monitoring: not checking Retinal Examination: Not up to Date Foot Exam: Up to Date Diabetic Education: Completed Pneumovax: Up to Date Influenza: Up to Date Aspirin: no   Relevant past medical, surgical, family and social history reviewed and updated as indicated. Interim medical history since our last visit reviewed. Allergies and medications reviewed and updated.  Review of Systems  Constitutional: Negative.   Respiratory: Negative.    Cardiovascular: Negative.   Gastrointestinal: Negative.   Musculoskeletal: Negative.   Neurological: Negative.   Psychiatric/Behavioral: Negative.      Per HPI unless specifically indicated above     Objective:    BP (!) 88/63 (BP Location: Left Arm, Patient Position: Sitting, Cuff Size: Large)   Pulse 74   Temp 98.9 F (37.2 C) (Oral)   Resp 17   Ht 5' 5.98" (1.676 m)   Wt 251 lb 9.6 oz (114.1 kg)   SpO2 99%   BMI 40.63 kg/m   Wt Readings from Last 3 Encounters:  09/11/23 251 lb 9.6 oz (114.1 kg)  09/10/23 250 lb 9.6 oz (113.7 kg)  09/05/23 257 lb (116.6 kg)    Physical Exam Vitals and nursing note reviewed.  Constitutional:      General: He is not in acute distress.    Appearance: Normal appearance. He is obese. He is not ill-appearing, toxic-appearing or diaphoretic.  HENT:     Head: Normocephalic and atraumatic.     Right Ear: External ear normal.     Left Ear: External ear normal.     Nose: Nose normal.     Mouth/Throat:     Mouth:  Mucous membranes are moist.     Pharynx: Oropharynx is clear.     Comments: Thrush in mouth Eyes:     General: No scleral icterus.       Right eye: No discharge.        Left eye: No discharge.     Extraocular Movements: Extraocular movements intact.     Conjunctiva/sclera: Conjunctivae normal.     Pupils: Pupils are equal, round, and reactive to light.  Cardiovascular:     Rate and Rhythm: Normal rate and regular rhythm.  Pulses: Normal pulses.     Heart sounds: Normal heart sounds. No murmur heard.    No friction rub. No gallop.  Pulmonary:     Effort: Pulmonary effort is normal. No respiratory distress.     Breath sounds: Normal breath sounds. No stridor. No wheezing, rhonchi or rales.  Chest:     Chest wall: No tenderness.  Musculoskeletal:        General: Normal range of motion.     Cervical back: Normal range of motion and neck supple.  Skin:    General: Skin is warm and dry.     Capillary Refill: Capillary refill takes less than 2 seconds.     Coloration: Skin is not jaundiced or pale.     Findings: No bruising, erythema, lesion or rash.  Neurological:     General: No focal deficit present.     Mental Status: He is alert and oriented to person, place, and time. Mental status is at baseline.  Psychiatric:        Mood and Affect: Mood normal.        Behavior: Behavior normal.        Thought Content: Thought content normal.        Judgment: Judgment normal.     Results for orders placed or performed in visit on 09/11/23  CoaguChek XS/INR Waived   Collection Time: 09/11/23  1:51 PM  Result Value Ref Range   INR 5.3 (HH) 0.9 - 1.1   Prothrombin Time 64.0 sec  Bayer DCA Hb A1c Waived   Collection Time: 09/11/23  1:51 PM  Result Value Ref Range   HB A1C (BAYER DCA - WAIVED) 6.1 (H) 4.8 - 5.6 %  Microalbumin, Urine Waived   Collection Time: 09/11/23  1:51 PM  Result Value Ref Range   Microalb, Ur Waived 80 (H) 0 - 19 mg/L   Creatinine, Urine Waived 50 10 - 300  mg/dL   Microalb/Creat Ratio 30-300 (H) <30 mg/g  CBC with Differential/Platelet   Collection Time: 09/11/23  1:52 PM  Result Value Ref Range   WBC 12.3 (H) 3.4 - 10.8 x10E3/uL   RBC 5.80 4.14 - 5.80 x10E6/uL   Hemoglobin 16.0 13.0 - 17.7 g/dL   Hematocrit 29.5 (H) 62.1 - 51.0 %   MCV 88 79 - 97 fL   MCH 27.6 26.6 - 33.0 pg   MCHC 31.3 (L) 31.5 - 35.7 g/dL   RDW 30.8 (H) 65.7 - 84.6 %   Platelets 412 150 - 450 x10E3/uL   Neutrophils 71 Not Estab. %   Lymphs 18 Not Estab. %   Monocytes 7 Not Estab. %   Eos 1 Not Estab. %   Basos 1 Not Estab. %   Neutrophils Absolute 8.8 (H) 1.4 - 7.0 x10E3/uL   Lymphocytes Absolute 2.2 0.7 - 3.1 x10E3/uL   Monocytes Absolute 0.9 0.1 - 0.9 x10E3/uL   EOS (ABSOLUTE) 0.1 0.0 - 0.4 x10E3/uL   Basophils Absolute 0.1 0.0 - 0.2 x10E3/uL   Immature Granulocytes 2 Not Estab. %   Immature Grans (Abs) 0.2 (H) 0.0 - 0.1 x10E3/uL  Comprehensive metabolic panel   Collection Time: 09/11/23  1:52 PM  Result Value Ref Range   Glucose 84 70 - 99 mg/dL   BUN 31 (H) 8 - 27 mg/dL   Creatinine, Ser 9.62 (H) 0.76 - 1.27 mg/dL   eGFR 48 (L) >95 MW/UXL/2.44   BUN/Creatinine Ratio 20 10 - 24   Sodium 140 134 - 144 mmol/L  Potassium 4.1 3.5 - 5.2 mmol/L   Chloride 97 96 - 106 mmol/L   CO2 27 20 - 29 mmol/L   Calcium 9.8 8.6 - 10.2 mg/dL   Total Protein 7.2 6.0 - 8.5 g/dL   Albumin 4.1 3.9 - 4.9 g/dL   Globulin, Total 3.1 1.5 - 4.5 g/dL   Bilirubin Total 0.6 0.0 - 1.2 mg/dL   Alkaline Phosphatase 93 44 - 121 IU/L   AST 63 (H) 0 - 40 IU/L   ALT 75 (H) 0 - 44 IU/L  Lipid Panel w/o Chol/HDL Ratio   Collection Time: 09/11/23  1:52 PM  Result Value Ref Range   Cholesterol, Total 145 100 - 199 mg/dL   Triglycerides 914 (H) 0 - 149 mg/dL   HDL 69 >78 mg/dL   VLDL Cholesterol Cal 29 5 - 40 mg/dL   LDL Chol Calc (NIH) 47 0 - 99 mg/dL  Uric acid   Collection Time: 09/11/23  1:52 PM  Result Value Ref Range   Uric Acid 4.2 3.8 - 8.4 mg/dL  INR/PT   Collection  Time: 09/11/23  3:12 PM  Result Value Ref Range   INR 5.2 (HH) 0.9 - 1.2   Prothrombin Time 51.4 (H) 9.1 - 12.0 sec   *Note: Due to a large number of results and/or encounters for the requested time period, some results have not been displayed. A complete set of results can be found in Results Review.      Assessment & Plan:   Problem List Items Addressed This Visit       Cardiovascular and Mediastinum   Hypertension associated with diabetes (HCC) - Primary   BP running quite low. Working with cardiology to balance fluid with hypotension. No symptoms right now. Continue to monitor.       Relevant Medications   warfarin (COUMADIN) 3 MG tablet   warfarin (COUMADIN) 1 MG tablet   Other Relevant Orders   CBC with Differential/Platelet (Completed)   Comprehensive metabolic panel (Completed)   Microalbumin, Urine Waived (Completed)   Ambulatory referral to Home Health   Aortic atherosclerosis (HCC)   Will keep BP and cholesterol under good control. Continue to monitor. Call with any concerns.       Relevant Medications   warfarin (COUMADIN) 3 MG tablet   warfarin (COUMADIN) 1 MG tablet   Other Relevant Orders   CBC with Differential/Platelet (Completed)   Comprehensive metabolic panel (Completed)   Lipid Panel w/o Chol/HDL Ratio (Completed)   Ambulatory referral to Home Health   Type 2 diabetes mellitus with cardiac complication (HCC)   Sugars doing well with A1c of 6.1. Continue current regimen. Continue to monitor.       Relevant Medications   warfarin (COUMADIN) 3 MG tablet   warfarin (COUMADIN) 1 MG tablet   Other Relevant Orders   CBC with Differential/Platelet (Completed)   Bayer DCA Hb A1c Waived (Completed)   Comprehensive metabolic panel (Completed)   Ambulatory referral to Home Health   Valvular heart disease   Supratherapeutic INR today- see discussion under that.       Relevant Medications   warfarin (COUMADIN) 3 MG tablet   warfarin (COUMADIN) 1 MG  tablet   Other Relevant Orders   CoaguChek XS/INR Waived (Completed)   Ambulatory referral to Home Health     Respiratory   COPD (chronic obstructive pulmonary disease) (HCC)   Under good control on current regimen. Continue current regimen. Continue to monitor. Call with any concerns. Refills given. Labs  drawn today.        Relevant Orders   CBC with Differential/Platelet (Completed)   Comprehensive metabolic panel (Completed)   Ambulatory referral to Home Health     Other   Chronic gout without tophus   Under good control on current regimen. Continue current regimen. Continue to monitor. Call with any concerns. Refills given. Labs drawn today.        Relevant Orders   CBC with Differential/Platelet (Completed)   Comprehensive metabolic panel (Completed)   Uric acid (Completed)   Ambulatory referral to Home Health   Pacemaker   Stable, continue to follow with cardiology.       Relevant Orders   Ambulatory referral to Home Health   Other Visit Diagnoses       Supratherapeutic INR       No Coumadin Tuesday, 3.5mg  Wed, Thursday, Recheck INR Friday   Relevant Orders   Ambulatory referral to Home Health   INR/PT (Completed)     Thrush       Will treat with nystatin. Call with any concerns.   Relevant Medications   nystatin (MYCOSTATIN) 100000 UNIT/ML suspension        Follow up plan: Return Friday-.

## 2023-09-11 NOTE — Patient Instructions (Signed)
 No Coumadin Tuesday 3.5mg  Wed, Thursday Recheck INR Friday

## 2023-09-11 NOTE — Telephone Encounter (Signed)
 In the works with PCP. Patient and caretaker aware.

## 2023-09-11 NOTE — Patient Instructions (Signed)
 Visit Information  Thank you for taking time to visit with me today. Please don't hesitate to contact me if I can be of assistance to you before our next scheduled telephone appointment.  Our next appointment is by telephone on 09/19/23 at 11:00am   Patient/ Caregiver  voices no new complaints or concerns  and has not developed/ reported any new medical issues / Dx or acute changes. - since last follow-up call for most recent   Hospital stay    2/24-2/27  / 2025  No reported or reviewed Hospital Readmissions / No Urgent Care Visits / Transfers for Acute Change in condition .  MD/Specialist or Consultant visits reported since last follow-up call- He was seen by Cardiology  and had pacemaker check , no medication changes  He had an AWV 3/4  PCP visit today 09/11/23  He has an audiology visit scheduled 09/21/23  He continued with a cough and congestion and has a recc for Robitussin with Codeine He has a CXR - negative for acute disease Patient was Alert & O x 4 ,responsive, able to voice needs, cooperative with visit   There were no noted or reported cognitive/ mental status changes during this assessment General status reviewed  patient presents as expected for age and current situation with  finding as noted in previous sections of assessment  Patient denies changes in ADL function or IADLs  at this time. His caregiver expressed she is interested in at home assistance and if there are programs she cn utilize to get assistance for her providing his care. They have a [facility] SW call 09/17/23  Hi s Caregiver / [name] expressed concern for equipment needs. They would like  new IRN home device She is working on getting him an updated machine.Will discuss with PCP. She also wanted a walker and will ask PCP for a script along with Home Health referral   Patient mood, expressions, emotional tone, insight, judgement  and thought content as expected with no notable or reported change No reports of new onset  or increased / uncontrolled pain, has chronic pain managed with medicinal and non-medicinal interventions.  No changes in skin, no rashes, wounds or open areas  Diet reviewed Patient is following prescribed diet education provided as needed to promote  healthy diet . Therapy Services NA  at this time  Medication reconciliation/ review completed based on most recent medication list in EHR; and in review of recent Provider follow-up appointments- confirmed patient obtained / is taking all newly prescribed medications as instructed and is aware of any changes to previous medications regimen including dosage adjustments.  Reports  no new changes in medication regimen or treatment plan during this review period.  Changes noted- Medications and current treatments are effective for use intended. No adverse Drug reactions  reported No changes or modifications indicated at this time.   Patient self-Caregiver manages  medications Patient is able to take medications without difficulty;  denies questions/ concerns and reports no barriers to medication adherence at this time   Patient / Caregiver educated on red flag s/s to watch for based on current discharge diagnosis and was encouraged to report, any changes in baseline or  medication regimen,   or any new unmanaged side effects or symptoms not relieved with interventions  to PCP and / or the  [facility] Case Management team . Following is a copy of your care plan:   Goals Addressed  This Visit's Progress    TOC Care Plan       Current Barriers:  Medication access new medication Medication management  Diet/Nutrition/Food Resources Meal assistance  Provider appointments PCP, Cardiology and GI Equipment/DME Walker, Nebulizer , INR  Functional/Safety Falls High Risk medications  Care Coordination needs related to Financial constraints , Limited social support( no family) , Limited access to food, ADL IADL limitations, Family and relationship  dysfunction, and Lacks knowledge of community resource: Chronic Disease Management support and education needs related to Atrial Fibrillation and Pulmonary Disease   RNCM Clinical Goal(s):  Patient will work with the Care Management team over the next 30 days to address Transition of Care Barriers: Medication access Medication Management Diet/Nutrition/Food Resources Support at home Provider appointments Equipment/DME take all medications exactly as prescribed and will call provider for medication related questions as evidenced by no missed medication doses  attend all scheduled medical appointments: as evidenced by no missed follow-up visits  work with Child psychotherapist to address management of Financial constraints related to  Limited social support, Limited access to food, ADL IADL limitations, and Lacks knowledge of community resource through collaboration with SW , provider, and care team   Work with RN CM on the management of Atrial Fibrillation as evidenced by review of EMR and  through collaboration with Medical illustrator, provider, and care team.   Interventions: Evaluation of current treatment plan related to  self management and patient's adherence to plan as established by provider  Transitions of Care:  Goal on track:  Yes. Doctor Visits  - discussed the importance of doctor visits Troubleshoot use of DME in the home  Post discharge activity limitations prescribed by provider reviewed Reviewed Signs and symptoms of infection  AFIB Interventions: (Status:  Goal on track:  Yes.) Short Term Goal   Reviewed importance of adherence to anticoagulant exactly as prescribed Advised patient to discuss respiratory symptoms, need for additional equipment and supplies  with provider Counseled on bleeding risk associated with Coumadin  and importance of self-monitoring for signs/symptoms of bleeding Counseled on importance of regular laboratory monitoring as prescribed Counseled on seeking  medical attention after a head injury or if there is blood in the urine/stool Screening for signs and symptoms of depression related to chronic disease state  Assessed social determinant of health barriers  Patient Goals/Self-Care Activities: Participate in Transition of Care Program/Attend TOC scheduled calls Take all medications as prescribed Attend all scheduled provider appointments Call pharmacy for medication refills 3-7 days in advance of running out of medications Perform all self care activities independently  Perform IADL's (shopping, preparing meals, housekeeping, managing finances) independently Call provider office for new concerns or questions  Work with the social worker to address care coordination needs and will continue to work with the clinical team to address health care and disease management related needs  Follow Up Plan:  Telephone follow up appointment with care management team member scheduled for:  09/19/22 @ 11:00am  The patient has been provided with contact information for the care management team and has been advised to call with any health related questions or concerns.          Medication review  Reviewed current home medications -- provided education as needed. Patient is aware of potential side effects and was encouraged to notify PCP for any adverse side effects or unwanted symptoms not relieved with interventions  Patient will call 911 for Medical Emergencies or Life -Threatening Symptoms.  Reviewed goals for care Patient/  Caregiver verbalizes understanding of instructions with the plan of care . The  Patient / Caregiver was encouraged to make informed decisions about care, actively participate in managing health conditions, and implement lifestyle changes as needed to promote independence and self-management of healthcare. SDOH screenings have been completed and addressed if indicted.  There are no reported barriers to care.    Follow-up Plan [facility]  Case Management Nurse will provide follow-up and on-going assessment ,evaluation and education of disease processes, recommended interventions for both chronic and acute medical conditions ,  along with ongoing review of symptoms ,medication reviews / reconciliation during each weekly call . Any updates , inconsistencies, discrepancies or acute care concerns will be addressed and routed to the correct Practitioner if indicated   The patient has been provided with contact information for the care management team and has been advised to call with any health-related questions or concerns. Follow up call  with Care Team  as scheduled,or sooner should any new problems arise.  [facility]  Please call the care guide team at [phone]  if you need to cancel or reschedule your appointment . For scheduled calls -Three attempts will be made to reach you -if the scheduled call is missed or  we are unable to reach the you after 3 attempts no additional outreach attempts will be made and the TOC follow-up will be closed .   If you need to speak to a Nurse you may  call me directly at the number below or if I am unavailable,and  your need is urgent  please call the main [facility]I number at [phone] and ask to speak with one of the [facility] ( Transition of Care )  Nurses  .  Patient was encouraged to Contact PCP with any changes in baseline or  medication regimen,  changes in health status  /  well-being, safety concerns, including falls any questions or concerns regarding ongoing medical care, any difficulty obtaining or picking up prescriptions, any changes or worsening in condition- including  symptoms not relieved  with interventions                                                                            Additionally, If you experience worsening of your symptoms, develop shortness of breath, If you are experiencing a medical emergency,  develop suicidal or homicidal thoughts you must seek medical  attention immediately by calling 911 or report to your local emergency department or urgent care.   If you have a non-emergency medical problem during routine business hours, please contact your provider's office and ask to speak with a nurse.       Please take the time to read instructions/literature along with the possible adverse reactions/side effects for all the Medicines that have been prescribed to you. Only take newly prescribed  Medications after you have completely understood and accept all the possible adverse reactions/side effects.   Do not take more than prescribed Medications for  Pain, Sleep and Anxiety. Do not drive when taking Pain medications or sleep aid/ insomnia  medications It is not advisable to combine anxiety, sleep and pain medications without talking with your primary care practitioner    If you  are experiencing a Mental Health or Behavioral Health Crisis or need someone to talk to Please call the Suicide and Crisis Lifeline: 40 You may also call the [place] National Suicide Prevention Lifeline: (630) 307-5080 or TTY: (770)495-4162 TTY 6031394069) to talk to a trained counselor.  You may call the Behavioral Health Crisis Line at 435-861-7713, at any time, 24 hours a day, 7 days a week- however If you are in danger or need immediate medical attention, call 911.   If you would like help to quit smoking, call 1-800-QUIT-NOW ( (704)632-5164) OR Espaol: 1-855-Djelo-Ya (2-951-884-1660) o para ms informacin haga clic aqu or Text READY to 630-160 to register via text.   Susa Loffler , BSN, RN [place]   [facility]I-Population Health RN Care Manager Direct Dial [phone]  Website: [email or web address]

## 2023-09-11 NOTE — Telephone Encounter (Signed)
 Noted.  I have added to appt notes for his next appt in June that is scheduled with Suzann.

## 2023-09-11 NOTE — Patient Outreach (Signed)
 Care Management  Transitions of Care Program Transitions of Care Post-discharge week 2   09/11/2023 Name: Roberto [name] MRN: [id number] DOB: [birth year]  Subjective: Roberto [name] is a 68 y.o. year old male who is a primary care patient of [name], DO. The Care Management team Engaged with patient Engaged with patient by telephone to assess and address transitions of care needs.   Consent to Services:  Patient was given information about care management services, agreed to services, and gave verbal consent to participate.   Assessment:   Patient/ Caregiver  voices no new complaints or concerns  and has not developed/ reported any new medical issues / Dx or acute changes. - since last follow-up call for most recent   Hospital stay    2/24-2/27  / 2025  No reported or reviewed Hospital Readmissions / No Urgent Care Visits / Transfers for Acute Change in condition .  MD/Specialist or Consultant visits reported since last follow-up call- He was seen by Cardiology  and had pacemaker check , no medication changes  He had an AWV 3/4  PCP visit today 09/11/23  He has an audiology visit scheduled 09/21/23  He continued with a cough and congestion and has a recc for Robitussin with Codeine He has a CXR - negative for acute disease Patient was Alert & O x 4 ,responsive, able to voice needs, cooperative with visit   There were no noted or reported cognitive/ mental status changes during this assessment General status reviewed  patient presents as expected for age and current situation with  finding as noted in previous sections of assessment  Patient denies changes in ADL function or IADLs  at this time. His caregiver expressed she is interested in at home assistance and if there are programs she cn utilize to get assistance for her providing his care. They have a [facility]I SW call 09/17/23  Hi s Caregiver / [name] expressed concern for equipment needs. They would like  new IRN home device She is  working on getting him an updated machine.Will discuss with PCP. She also wanted a walker and will ask PCP for a script along with Home Health referral   Patient mood, expressions, emotional tone, insight, judgement  and thought content as expected with no notable or reported change No reports of new onset or increased / uncontrolled pain, has chronic pain managed with medicinal and non-medicinal interventions.  No changes in skin, no rashes, wounds or open areas  Diet reviewed Patient is following prescribed diet education provided as needed to promote  healthy diet . Therapy Services NA  at this time  Medication reconciliation/ review completed based on most recent medication list in EHR; and in review of recent Provider follow-up appointments- confirmed patient obtained / is taking all newly prescribed medications as instructed and is aware of any changes to previous medications regimen including dosage adjustments.  Reports  no new changes in medication regimen or treatment plan during this review period.   Medications and current treatments are somewhat effective for use intended. His cough is still lingering No adverse Drug reactions  reported No changes or modifications indicated at this time.   Patient self-Caregiver manages  medications Patient is able to take medications without difficulty;  denies questions/ concerns and reports no barriers to medication adherence at this time   Patient / Caregiver educated on red flag s/s to watch for based on current discharge diagnosis and was encouraged to report, any changes in baseline or  medication regimen,   or any new unmanaged side effects or symptoms not relieved with interventions  to PCP and / or the  VBCI Case Management team .          SDOH Interventions    Flowsheet Row Telemedicine from 09/05/2023 in [facility] Clinical Support from 09/04/2023 in [facility] Telephone from 09/03/2023  in [place] [facility]PART[facility] Video Visit from 07/27/2023 in [facility] Telephone from 06/29/2023 in [place] [facility] DEPARTMENT Office Visit from 04/10/2023 in [facility]  SDOH Interventions        Food Insecurity Interventions -- Intervention Not Indicated AMB Referral -- Intervention Not Indicated --  Housing Interventions -- Intervention Not Indicated Intervention Not Indicated -- Intervention Not Indicated --  Transportation Interventions -- Intervention Not Indicated Intervention Not Indicated, Patient Resources (Friends/Family) -- Intervention Not Indicated --  Utilities Interventions -- AMB Referral AMB Referral -- Intervention Not Indicated --  Alcohol Usage Interventions -- Intervention Not Indicated (Score <7) -- -- -- --  Depression Interventions/Treatment  Currently on Treatment Currently on Treatment -- Currently on Treatment -- Medication, Currently on Treatment  Financial Strain Interventions -- Intervention Not Indicated -- -- -- --  Physical Activity Interventions -- Patient Declined -- -- -- --  Stress Interventions -- Intervention Not Indicated -- -- -- --  Social Connections Interventions -- Intervention Not Indicated -- -- -- --  Health Literacy Interventions -- Intervention Not Indicated -- -- -- --        Goals Addressed             This Visit's Progress    TOC Care Plan       Current Barriers:  Medication access new medication Medication management  Diet/Nutrition/Food Resources Meal assistance  Provider appointments PCP, Cardiology and GI Equipment/DME Walker, Nebulizer , INR  Functional/Safety Falls High Risk medications  Care Coordination needs related to Financial constraints , Limited social support( no family) , Limited access to food, ADL IADL limitations, Family and relationship dysfunction, and Lacks knowledge of community resource: Chronic Disease Management support and  education needs related to Atrial Fibrillation and Pulmonary Disease   RNCM Clinical Goal(s):  Patient will work with the Care Management team over the next 30 days to address Transition of Care Barriers: Medication access Medication Management Diet/Nutrition/Food Resources Support at home Provider appointments Equipment/DME take all medications exactly as prescribed and will call provider for medication related questions as evidenced by no missed medication doses  attend all scheduled medical appointments: as evidenced by no missed follow-up visits  work with Child psychotherapist to Teacher, adult education of Financial constraints related to  Limited social support, Limited access to food, ADL IADL limitations, and Lacks knowledge of community resource through collaboration with SW , provider, and care team   Work with RN CM on the management of Atrial Fibrillation as evidenced by review of EMR and  through collaboration with Medical illustrator, provider, and care team.   Interventions: Evaluation of current treatment plan related to  self management and patient's adherence to plan as established by provider  Transitions of Care:  Goal on track:  Yes. Doctor Visits  - discussed the importance of doctor visits Troubleshoot use of DME in the home  Post discharge activity limitations prescribed by provider reviewed Reviewed Signs and symptoms of infection  AFIB Interventions: (Status:  Goal on track:  Yes.) Short Term Goal   Reviewed importance  of adherence to anticoagulant exactly as prescribed Advised patient to discuss respiratory symptoms, need for additional equipment and supplies  with provider Counseled on bleeding risk associated with Coumadin  and importance of self-monitoring for signs/symptoms of bleeding Counseled on importance of regular laboratory monitoring as prescribed Counseled on seeking medical attention after a head injury or if there is blood in the urine/stool Screening for signs  and symptoms of depression related to chronic disease state  Assessed social determinant of health barriers  Patient Goals/Self-Care Activities: Participate in Transition of Care Program/Attend TOC scheduled calls Take all medications as prescribed Attend all scheduled provider appointments Call pharmacy for medication refills 3-7 days in advance of running out of medications Perform all self care activities independently  Perform IADL's (shopping, preparing meals, housekeeping, managing finances) independently Call provider office for new concerns or questions  Work with the social worker to address care coordination needs and will continue to work with the clinical team to address health care and disease management related needs  Follow Up Plan:  Telephone follow up appointment with care management team member scheduled for:  09/19/22 @ 11:00am  The patient has been provided with contact information for the care management team and has been advised to call with any health related questions or concerns.          Plan:  PCP visit 3/11- Req New INR machine, Script for Rollator, Referral for Home Health Services, Referral to Coumadin Clinic, Referral for Othal. Consult VBCI SW call 3/17 to review request for assistance at home and for Caregiver support   Routine follow-up and on-going assessment evaluation and education of disease processes, recommended interventions for both chronic and acute medical conditions , will occur during each weekly visit along with ongoing review of symptoms ,medication reviews and reconciliation. Any updates , inconsistencies, discrepancies or acute care concerns will be addressed and routed to the correct Practitioner if indicated   Based on current information and Insurance plan -Reviewed benefits available to patient, including details about eligibility options for care if any area of needs were identified.  Reviewed patients ability to access and / or  navigating the benefits system..Amb Referral made if indicted , refer to orders section of note for details   Please refer to Care Plan for goals and interventions -Effectiveness of interventions, symptom management and outcomes will be evaluated  weekly during [facility] 30-day Program Outreach calls  . Any necessary  changes and updates to Care Plan will be completed episodically    Reviewed goals for care Patient verbalizes understanding of instructions and care plan provided. Patient was encouraged to make informed decisions about their care, actively participate in managing their health condition, and implement lifestyle changes as needed to promote independence and self-management of health care   Patient was encouraged to Contact PCP with any changes in baseline or  medication regimen,  changes in health status  /  well-being, safety concerns, including falls any questions or concerns regarding ongoing medical care, any difficulty obtaining or picking up prescriptions, any changes or worsening in condition- including  symptoms not relieved  with interventions    The patient has been provided with contact information for the care management team and has been advised to call with any health-related questions or concerns. Follow up call  with Care Team  as scheduled,or sooner should any new problems arise.   The patient has been provided with contact information for the care management team and has been advised to call with  any health related questions or concerns.     Susa Loffler , BSN, RN [facility]   VBCI-Population Health RN Care Manager Direct Dial [phone]  Fax: [phone] Website: [email or web address]

## 2023-09-11 NOTE — Telephone Encounter (Signed)
 Patient aware that no rx's at this time but will treat however for Thrush with follow up scheduled for this week to determine next steps.

## 2023-09-12 ENCOUNTER — Telehealth: Payer: Self-pay | Admitting: Nurse Practitioner

## 2023-09-12 LAB — CBC WITH DIFFERENTIAL/PLATELET
Basophils Absolute: 0.1 10*3/uL (ref 0.0–0.2)
Basos: 1 %
EOS (ABSOLUTE): 0.1 10*3/uL (ref 0.0–0.4)
Eos: 1 %
Hematocrit: 51.2 % — ABNORMAL HIGH (ref 37.5–51.0)
Hemoglobin: 16 g/dL (ref 13.0–17.7)
Immature Grans (Abs): 0.2 10*3/uL — ABNORMAL HIGH (ref 0.0–0.1)
Immature Granulocytes: 2 %
Lymphocytes Absolute: 2.2 10*3/uL (ref 0.7–3.1)
Lymphs: 18 %
MCH: 27.6 pg (ref 26.6–33.0)
MCHC: 31.3 g/dL — ABNORMAL LOW (ref 31.5–35.7)
MCV: 88 fL (ref 79–97)
Monocytes Absolute: 0.9 10*3/uL (ref 0.1–0.9)
Monocytes: 7 %
Neutrophils Absolute: 8.8 10*3/uL — ABNORMAL HIGH (ref 1.4–7.0)
Neutrophils: 71 %
Platelets: 412 10*3/uL (ref 150–450)
RBC: 5.8 x10E6/uL (ref 4.14–5.80)
RDW: 16.7 % — ABNORMAL HIGH (ref 11.6–15.4)
WBC: 12.3 10*3/uL — ABNORMAL HIGH (ref 3.4–10.8)

## 2023-09-12 LAB — COMPREHENSIVE METABOLIC PANEL
ALT: 75 IU/L — ABNORMAL HIGH (ref 0–44)
AST: 63 IU/L — ABNORMAL HIGH (ref 0–40)
Albumin: 4.1 g/dL (ref 3.9–4.9)
Alkaline Phosphatase: 93 IU/L (ref 44–121)
BUN/Creatinine Ratio: 20 (ref 10–24)
BUN: 31 mg/dL — ABNORMAL HIGH (ref 8–27)
Bilirubin Total: 0.6 mg/dL (ref 0.0–1.2)
CO2: 27 mmol/L (ref 20–29)
Calcium: 9.8 mg/dL (ref 8.6–10.2)
Chloride: 97 mmol/L (ref 96–106)
Creatinine, Ser: 1.55 mg/dL — ABNORMAL HIGH (ref 0.76–1.27)
Globulin, Total: 3.1 g/dL (ref 1.5–4.5)
Glucose: 84 mg/dL (ref 70–99)
Potassium: 4.1 mmol/L (ref 3.5–5.2)
Sodium: 140 mmol/L (ref 134–144)
Total Protein: 7.2 g/dL (ref 6.0–8.5)
eGFR: 48 mL/min/{1.73_m2} — ABNORMAL LOW (ref >59)

## 2023-09-12 LAB — LIPID PANEL W/O CHOL/HDL RATIO
Cholesterol, Total: 145 mg/dL (ref 100–199)
HDL: 69 mg/dL (ref >39)
LDL Chol Calc (NIH): 47 mg/dL (ref 0–99)
Triglycerides: 177 mg/dL — ABNORMAL HIGH (ref 0–149)
VLDL Cholesterol Cal: 29 mg/dL (ref 5–40)

## 2023-09-12 LAB — URIC ACID: Uric Acid: 4.2 mg/dL (ref 3.8–8.4)

## 2023-09-12 LAB — PROTIME-INR
INR: 5.2 (ref 0.9–1.2)
Prothrombin Time: 51.4 s — ABNORMAL HIGH (ref 9.1–12.0)

## 2023-09-12 NOTE — Telephone Encounter (Signed)
 Received a call from on call nurse about critical INR of 5.2.  Nurse tried multiple times to reach out to the patient.  Reviewed visit with PCP from yesterday which shows that the INR has already been addressed and patient is returning to the clinic on Friday.

## 2023-09-14 ENCOUNTER — Telehealth: Payer: Self-pay | Admitting: Family Medicine

## 2023-09-14 ENCOUNTER — Encounter: Payer: Self-pay | Admitting: Family Medicine

## 2023-09-14 ENCOUNTER — Ambulatory Visit (INDEPENDENT_AMBULATORY_CARE_PROVIDER_SITE_OTHER): Admitting: Family Medicine

## 2023-09-14 VITALS — BP 106/69 | HR 67 | Temp 98.0°F | Resp 16 | Wt 249.0 lb

## 2023-09-14 DIAGNOSIS — R052 Subacute cough: Secondary | ICD-10-CM | POA: Diagnosis not present

## 2023-09-14 DIAGNOSIS — B370 Candidal stomatitis: Secondary | ICD-10-CM | POA: Diagnosis not present

## 2023-09-14 DIAGNOSIS — I38 Endocarditis, valve unspecified: Secondary | ICD-10-CM | POA: Diagnosis not present

## 2023-09-14 DIAGNOSIS — R791 Abnormal coagulation profile: Secondary | ICD-10-CM | POA: Diagnosis not present

## 2023-09-14 LAB — COAGUCHEK XS/INR WAIVED
INR: >8 (ref 0.9–1.1)
Prothrombin Time: >96 s

## 2023-09-14 NOTE — Telephone Encounter (Signed)
 Patient seen in office today as scheduled.

## 2023-09-14 NOTE — Assessment & Plan Note (Signed)
 Under good control on current regimen. Continue current regimen. Continue to monitor. Call with any concerns. Refills given. Labs drawn today.

## 2023-09-14 NOTE — Assessment & Plan Note (Signed)
 Supratherapeutic INR today- see discussion under that.

## 2023-09-14 NOTE — Assessment & Plan Note (Signed)
Stable, continue to follow with cardiology.

## 2023-09-14 NOTE — Progress Notes (Signed)
 BP 106/69 (BP Location: Right Arm, Patient Position: Sitting, Cuff Size: Large)   Pulse 67   Temp 98 F (36.7 C) (Oral)   Resp 16   Wt 249 lb (112.9 kg)   SpO2 98%   BMI 40.21 kg/m    Subjective:    Patient ID: Roberto [name], male    DOB: 30-Dec-1955, 69 y.o.   MRN: [id number]  Chief Complaint  Patient presents with   Coagulation Disorder   Coughing    Doesn't feel its better or worse. Also mentions his mouth is very dry from the medication possibly.     HPI: This patient is a 68 y.o. male who presents for coumadin management. The expected duration of coumadin treatment is lifelong The reason for anticoagulation is  mechanical heart valve.  Present Coumadin dose: 3.5mg  daily Goal: 2.5-3.5 2.5-3.5 Excessive bruising: no Nose bleeding: no Rectal bleeding: no Prolonged menstrual cycles: N/A Eating diet with consistent amounts of foods containing Vitamin K:yes Any recent antibiotic use? yes  Relevant past medical, surgical, family and social history reviewed and updated as indicated. Interim medical history since our last visit reviewed. Allergies and medications reviewed and updated.  Review of Systems  Constitutional: Negative.   HENT: Negative.    Respiratory:  Positive for cough. Negative for hemoptysis, sputum production, shortness of breath and wheezing.   Cardiovascular: Negative.   Genitourinary: Negative.   Musculoskeletal: Negative.   Skin: Negative.   Neurological: Negative.   Psychiatric/Behavioral: Negative.          Objective:    BP 106/69 (BP Location: Right Arm, Patient Position: Sitting, Cuff Size: Large)   Pulse 67   Temp 98 F (36.7 C) (Oral)   Resp 16   Wt 249 lb (112.9 kg)   SpO2 98%   BMI 40.21 kg/m   Wt Readings from Last 3 Encounters:  09/14/23 249 lb (112.9 kg)  09/11/23 251 lb 9.6 oz (114.1 kg)  09/10/23 250 lb 9.6 oz (113.7 kg)     Physical Exam Vitals and nursing note reviewed.  Constitutional:      General: He is not in  acute distress.    Appearance: Normal appearance. He is obese. He is not ill-appearing, toxic-appearing or diaphoretic.  HENT:     Head: Normocephalic and atraumatic.     Nose: Nose normal.     Mouth/Throat:     Mouth: Mucous membranes are moist.     Pharynx: Oropharynx is clear.  Eyes:     Extraocular Movements: Extraocular movements intact.     Conjunctiva/sclera: Conjunctivae normal.     Pupils: Pupils are equal, round, and reactive to light.  Cardiovascular:     Rate and Rhythm: Normal rate and regular rhythm.     Pulses: Normal pulses.     Heart sounds: Normal heart sounds. No murmur heard.    No friction rub. No gallop.  Pulmonary:     Effort: Pulmonary effort is normal. No respiratory distress.     Breath sounds: Normal breath sounds. No stridor. No wheezing, rhonchi or rales.  Chest:     Chest wall: No tenderness.  Skin:    General: Skin is warm and dry.     Capillary Refill: Capillary refill takes less than 2 seconds.     Coloration: Skin is not jaundiced or pale.     Findings: No bruising, erythema, lesion or rash.  Neurological:     General: No focal deficit present.     Mental Status: He is alert.  Mental status is at baseline. He is disoriented.     Cranial Nerves: No cranial nerve deficit.     Sensory: No sensory deficit.     Motor: No weakness.     Coordination: Coordination normal.     Gait: Gait normal.     Deep Tendon Reflexes: Reflexes normal.  Psychiatric:        Mood and Affect: Mood normal.        Behavior: Behavior normal.        Thought Content: Thought content normal.        Judgment: Judgment normal.    Last INR: >8.0 PT: >96.0    Last CBC:  Lab Results  Component Value Date   WBC 12.3 (H) 09/11/2023   HGB 16.0 09/11/2023   HCT 51.2 (H) 09/11/2023   MCV 88 09/11/2023   PLT 412 09/11/2023    Results for orders placed or performed in visit on 09/11/23  CoaguChek XS/INR Waived   Collection Time: 09/11/23  1:51 PM  Result Value Ref  Range   INR 5.3 (HH) 0.9 - 1.1   Prothrombin Time 64.0 sec  Bayer DCA Hb A1c Waived   Collection Time: 09/11/23  1:51 PM  Result Value Ref Range   HB A1C (BAYER DCA - WAIVED) 6.1 (H) 4.8 - 5.6 %  Microalbumin, Urine Waived   Collection Time: 09/11/23  1:51 PM  Result Value Ref Range   Microalb, Ur Waived 80 (H) 0 - 19 mg/L   Creatinine, Urine Waived 50 10 - 300 mg/dL   Microalb/Creat Ratio 30-300 (H) <30 mg/g  CBC with Differential/Platelet   Collection Time: 09/11/23  1:52 PM  Result Value Ref Range   WBC 12.3 (H) 3.4 - 10.8 x10E3/uL   RBC 5.80 4.14 - 5.80 x10E6/uL   Hemoglobin 16.0 13.0 - 17.7 g/dL   Hematocrit 40.9 (H) 81.1 - 51.0 %   MCV 88 79 - 97 fL   MCH 27.6 26.6 - 33.0 pg   MCHC 31.3 (L) 31.5 - 35.7 g/dL   RDW 91.4 (H) 78.2 - 95.6 %   Platelets 412 150 - 450 x10E3/uL   Neutrophils 71 Not Estab. %   Lymphs 18 Not Estab. %   Monocytes 7 Not Estab. %   Eos 1 Not Estab. %   Basos 1 Not Estab. %   Neutrophils Absolute 8.8 (H) 1.4 - 7.0 x10E3/uL   Lymphocytes Absolute 2.2 0.7 - 3.1 x10E3/uL   Monocytes Absolute 0.9 0.1 - 0.9 x10E3/uL   EOS (ABSOLUTE) 0.1 0.0 - 0.4 x10E3/uL   Basophils Absolute 0.1 0.0 - 0.2 x10E3/uL   Immature Granulocytes 2 Not Estab. %   Immature Grans (Abs) 0.2 (H) 0.0 - 0.1 x10E3/uL  Comprehensive metabolic panel   Collection Time: 09/11/23  1:52 PM  Result Value Ref Range   Glucose 84 70 - 99 mg/dL   BUN 31 (H) 8 - 27 mg/dL   Creatinine, Ser 2.13 (H) 0.76 - 1.27 mg/dL   eGFR 48 (L) >08 MV/HQI/6.96   BUN/Creatinine Ratio 20 10 - 24   Sodium 140 134 - 144 mmol/L   Potassium 4.1 3.5 - 5.2 mmol/L   Chloride 97 96 - 106 mmol/L   CO2 27 20 - 29 mmol/L   Calcium 9.8 8.6 - 10.2 mg/dL   Total Protein 7.2 6.0 - 8.5 g/dL   Albumin 4.1 3.9 - 4.9 g/dL   Globulin, Total 3.1 1.5 - 4.5 g/dL   Bilirubin Total 0.6 0.0 - 1.2 mg/dL  Alkaline Phosphatase 93 44 - 121 IU/L   AST 63 (H) 0 - 40 IU/L   ALT 75 (H) 0 - 44 IU/L  Lipid Panel w/o Chol/HDL Ratio    Collection Time: 09/11/23  1:52 PM  Result Value Ref Range   Cholesterol, Total 145 100 - 199 mg/dL   Triglycerides 086 (H) 0 - 149 mg/dL   HDL 69 >57 mg/dL   VLDL Cholesterol Cal 29 5 - 40 mg/dL   LDL Chol Calc (NIH) 47 0 - 99 mg/dL  Uric acid   Collection Time: 09/11/23  1:52 PM  Result Value Ref Range   Uric Acid 4.2 3.8 - 8.4 mg/dL  INR/PT   Collection Time: 09/11/23  3:12 PM  Result Value Ref Range   INR 5.2 (HH) 0.9 - 1.2   Prothrombin Time 51.4 (H) 9.1 - 12.0 sec   *Note: Due to a large number of results and/or encounters for the requested time period, some results have not been displayed. A complete set of results can be found in Results Review.       Assessment:     ICD-10-CM   1. Supratherapeutic INR  R79.1    No bleeding. Warning signs to go to ER discussed. Hold coumadin over the weekend. Recheck INR on Monday.    2. Valvular heart disease  I38 CoaguChek XS/INR Waived    INR/PT    3. Thrush  B37.0    Resolved    4. Subacute cough  R05.2    CXR normal. Scheduled for CT chest on Wednesday. Await results.      Plan:   Problem List Items Addressed This Visit       Cardiovascular and Mediastinum   Valvular heart disease   Relevant Orders   CoaguChek XS/INR Waived   INR/PT   Other Visit Diagnoses       Supratherapeutic INR    -  Primary   No bleeding. Warning signs to go to ER discussed. Hold coumadin over the weekend. Recheck INR on Monday.     Thrush       Resolved     Subacute cough       CXR normal. Scheduled for CT chest on Wednesday. Await results.       Discussed current plan face-to-face with patient. For coumadin dosing, elected to hold dose. Will plan to recheck INR in  3 days .

## 2023-09-14 NOTE — Patient Instructions (Signed)
 Start prednisone, continue augmentin for extended course.  Please seek medical attention in the Emergency Department immediately if you develop persistant or increased chest pain, shortness of breath, fainting spells, sudden sweatiness, pain radiating to your back, coughing up blood, leg swelling, fevers greater than 100.3F that do not resolve with Tylenol, or any other symptoms that concern you.

## 2023-09-14 NOTE — Telephone Encounter (Signed)
 Copied from CRM [id number]. Topic: Appointments - Scheduling Inquiry for Clinic >> Sep 13, 2023  6:00 PM Victorino Dike T wrote: Reason for CRM: Patient is due in 3/14 9:20 but there is a conflict and need to know if he can come in either before 8 or after lunch around 1p- please [phone]  has  not heard from anyone about his equipment, walker, INR machine, nebulizer either, need to speak with Florentina Addison

## 2023-09-14 NOTE — Assessment & Plan Note (Signed)
 BP running quite low. Working with cardiology to balance fluid with hypotension. No symptoms right now. Continue to monitor.

## 2023-09-14 NOTE — Assessment & Plan Note (Signed)
 Sugars doing well with A1c of 6.1. Continue current regimen. Continue to monitor.

## 2023-09-14 NOTE — Assessment & Plan Note (Signed)
 Will keep BP and cholesterol under good control. Continue to monitor. Call with any concerns.

## 2023-09-15 LAB — PROTIME-INR
INR: 8.4 (ref 0.9–1.2)
Prothrombin Time: 79.4 s — ABNORMAL HIGH (ref 9.1–12.0)

## 2023-09-15 LAB — COAGUCHEK XS/INR WAIVED
INR: 5.3 (ref 0.9–1.1)
Prothrombin Time: 64 s

## 2023-09-17 ENCOUNTER — Ambulatory Visit (INDEPENDENT_AMBULATORY_CARE_PROVIDER_SITE_OTHER): Admitting: Family Medicine

## 2023-09-17 ENCOUNTER — Other Ambulatory Visit: Payer: Self-pay | Admitting: Family Medicine

## 2023-09-17 ENCOUNTER — Telehealth: Payer: Self-pay

## 2023-09-17 ENCOUNTER — Other Ambulatory Visit: Payer: Self-pay

## 2023-09-17 ENCOUNTER — Encounter: Payer: Self-pay | Admitting: Family Medicine

## 2023-09-17 VITALS — BP 111/75 | HR 79 | Ht 65.98 in | Wt 251.8 lb

## 2023-09-17 DIAGNOSIS — Z952 Presence of prosthetic heart valve: Secondary | ICD-10-CM

## 2023-09-17 DIAGNOSIS — L239 Allergic contact dermatitis, unspecified cause: Secondary | ICD-10-CM | POA: Diagnosis not present

## 2023-09-17 LAB — COAGUCHEK XS/INR WAIVED
INR: 2.4 — ABNORMAL HIGH (ref 0.9–1.1)
Prothrombin Time: 29.4 s

## 2023-09-17 MED ORDER — PREDNISONE 50 MG PO TABS: 50.0000 mg | ORAL_TABLET | Freq: Every day | ORAL | 0 refills | Status: DC

## 2023-09-17 MED ORDER — NYSTATIN 100000 UNIT/ML MT SUSP
OROMUCOSAL | 1 refills | Status: DC
Start: 2023-09-17 — End: 2023-09-26

## 2023-09-17 MED ORDER — NYSTATIN 100000 UNIT/GM EX POWD: 1.0000 | Freq: Three times a day (TID) | CUTANEOUS | 3 refills | Status: DC

## 2023-09-17 NOTE — Progress Notes (Signed)
 BP 111/75 (BP Location: Left Arm, Patient Position: Sitting, Cuff Size: Large)   Pulse 79   Ht 5' 5.98" (1.676 m)   Wt 251 lb 12.8 oz (114.2 kg)   SpO2 98%   BMI 40.67 kg/m    Subjective:    Patient ID: Roberto [name], male    DOB: September 07, 1955, 68 y.o.   MRN: [id number]  HPI: Roberto [name] is a 68 y.o. male  Chief Complaint  Patient presents with   INR check   Rash    Right arm and bilateral posterior lower legs    Medication Refill    nystatin   Coumadin Management.  The expected duration of coumadin treatment is lifelong The reason for anticoagulation is  mechanical heart valve.  Present Coumadin dose: on hold for last 4 days Goal: 2.5-3.5  Excessive bruising: no Nose bleeding: no Rectal bleeding: no Prolonged menstrual cycles: N/A Eating diet with consistent amounts of foods containing Vitamin K:no Any recent antibiotic use? no  RASH Duration:  2 weeks  Location: arms and legs  Itching: yes Burning: no Redness: no Oozing: no Scaling: no Blisters: no Painful: no Fevers: no Change in detergents/soaps/personal care products: no Recent illness: no Recent travel:no History of same: no Context: stable Alleviating factors: nothing Treatments attempted:nothing Shortness of breath: no  Throat/tongue swelling: no Myalgias/arthralgias: no  Relevant past medical, surgical, family and social history reviewed and updated as indicated. Interim medical history since our last visit reviewed. Allergies and medications reviewed and updated.  Review of Systems  Constitutional: Negative.   Respiratory: Negative.    Cardiovascular: Negative.   Skin:  Positive for rash. Negative for color change, pallor and wound.  Neurological: Negative.   Psychiatric/Behavioral: Negative.      Per HPI unless specifically indicated above     Objective:    BP 111/75 (BP Location: Left Arm, Patient Position: Sitting, Cuff Size: Large)   Pulse 79   Ht 5' 5.98" (1.676 m)   Wt  251 lb 12.8 oz (114.2 kg)   SpO2 98%   BMI 40.67 kg/m   Wt Readings from Last 3 Encounters:  09/17/23 251 lb 12.8 oz (114.2 kg)  09/14/23 249 lb (112.9 kg)  09/11/23 251 lb 9.6 oz (114.1 kg)    Physical Exam Vitals and nursing note reviewed.  Constitutional:      General: He is not in acute distress.    Appearance: Normal appearance. He is obese. He is not ill-appearing, toxic-appearing or diaphoretic.  HENT:     Head: Normocephalic and atraumatic.     Right Ear: External ear normal.     Left Ear: External ear normal.     Nose: Nose normal.     Mouth/Throat:     Mouth: Mucous membranes are moist.     Pharynx: Oropharynx is clear.  Eyes:     General: No scleral icterus.       Right eye: No discharge.        Left eye: No discharge.     Extraocular Movements: Extraocular movements intact.     Conjunctiva/sclera: Conjunctivae normal.     Pupils: Pupils are equal, round, and reactive to light.  Cardiovascular:     Rate and Rhythm: Normal rate and regular rhythm.     Pulses: Normal pulses.     Heart sounds: Normal heart sounds. No murmur heard.    No friction rub. No gallop.  Pulmonary:     Effort: Pulmonary effort is normal. No respiratory distress.  Breath sounds: Normal breath sounds. No stridor. No wheezing, rhonchi or rales.  Chest:     Chest wall: No tenderness.  Musculoskeletal:        General: Normal range of motion.     Cervical back: Normal range of motion and neck supple.  Skin:    General: Skin is warm and dry.     Capillary Refill: Capillary refill takes less than 2 seconds.     Coloration: Skin is not jaundiced or pale.     Findings: Rash (bilateral legs and arms) present. No bruising, erythema or lesion.  Neurological:     General: No focal deficit present.     Mental Status: He is alert and oriented to person, place, and time. Mental status is at baseline.  Psychiatric:        Mood and Affect: Mood normal.        Behavior: Behavior normal.         Thought Content: Thought content normal.        Judgment: Judgment normal.     Results for orders placed or performed in visit on 09/17/23  CoaguChek XS/INR Waived   Collection Time: 09/17/23  8:27 AM  Result Value Ref Range   INR 2.4 (H) 0.9 - 1.1   Prothrombin Time 29.4 sec   *Note: Due to a large number of results and/or encounters for the requested time period, some results have not been displayed. A complete set of results can be found in Results Review.      Assessment & Plan:   Problem List Items Addressed This Visit       Other   H/O mechanical aortic valve replacement - Primary   INR 2.4- will restart 1mg  daily and recheck in 3 days. Call with any concerns.       Relevant Orders   CoaguChek XS/INR Waived (Completed)   Other Visit Diagnoses       Allergic contact dermatitis, unspecified trigger       Will treat with steroid burst. Call if not getting better or getting worse.        Follow up plan: Return THursday- OK to double book if need be.

## 2023-09-17 NOTE — Telephone Encounter (Signed)
 Copied from CRM [id number]. Topic: Clinical - Medication Question >> Sep 17, 2023 11:36 AM Epimenio Foot F wrote: Reason for CRM: Ander Purpura is calling on behalf of patient because she says Dr. Laural Benes was supposed to send in an oral suspension medication for thrush, but they just received Nystatin powder and she wanted to know what the powder was for. Selena Batten says they still need the oral suspension medication and wanted to know if that could be sent in. Please advise.

## 2023-09-17 NOTE — Patient Outreach (Signed)
  Medicaid Managed Care   Unsuccessful Outreach Note  09/17/2023 Name: Roberto [name] MRN: [id number] DOB: 10-18-1955  Referred by: [name], DO Reason for referral : High Risk Managed Medicaid (MM social work unsuccessful telephone outreach )   An unsuccessful telephone outreach was attempted today. The patient was referred to the case management team for assistance with care management and care coordination.   Follow Up Plan: A HIPAA compliant phone message was left for the patient providing contact information and requesting a return call.   Abelino Derrick, MHA [facility]  Managed [facility] Social Worker [phone]

## 2023-09-17 NOTE — Telephone Encounter (Signed)
 Routing to provider to advise. I see the nystatin solution sent in last week, but should be due for a new prescription tomorrow based on previous script. Was this supposed to be refilled for the patient?

## 2023-09-17 NOTE — Assessment & Plan Note (Signed)
 INR 2.4- will restart 1mg  daily and recheck in 3 days. Call with any concerns.

## 2023-09-17 NOTE — Patient Instructions (Signed)
  Medicaid Managed Care   Unsuccessful Outreach Note  09/17/2023 Name: Roberto [name] MRN: [id number] DOB: 10-18-1955  Referred by: [name], DO Reason for referral : High Risk Managed Medicaid (MM social work unsuccessful telephone outreach )   An unsuccessful telephone outreach was attempted today. The patient was referred to the case management team for assistance with care management and care coordination.   Follow Up Plan: A HIPAA compliant phone message was left for the patient providing contact information and requesting a return call.   Abelino Derrick, MHA [facility]  Managed [facility] Social Worker [phone]

## 2023-09-19 ENCOUNTER — Ambulatory Visit (INDEPENDENT_AMBULATORY_CARE_PROVIDER_SITE_OTHER): Payer: 59 | Admitting: Surgery

## 2023-09-19 ENCOUNTER — Other Ambulatory Visit: Payer: Self-pay

## 2023-09-19 ENCOUNTER — Ambulatory Visit
Admission: RE | Admit: 2023-09-19 | Discharge: 2023-09-19 | Disposition: A | Discharge status: Home / Self Care | Payer: 59 | Source: Ambulatory Visit | Attending: Surgery | Admitting: Surgery

## 2023-09-19 ENCOUNTER — Encounter: Payer: Self-pay | Admitting: Surgery

## 2023-09-19 VITALS — BP 113/81 | HR 71 | Resp 18 | Ht 65.0 in | Wt 249.0 lb

## 2023-09-19 DIAGNOSIS — I7121 Aneurysm of the ascending aorta, without rupture: Secondary | ICD-10-CM

## 2023-09-19 NOTE — Progress Notes (Unsigned)
 HPI: ***  Current Outpatient Medications  Medication Sig Dispense Refill   acetaminophen (TYLENOL) 500 MG tablet Take 1,000 mg by mouth every 6 (six) hours as needed for moderate pain (pain score 4-6).     albuterol (PROVENTIL) (2.5 MG/3ML) 0.083% nebulizer solution USE 1 VIAL IN NEBULIZER EVERY 6 HOURS AS NEEDED FOR WHEEZING FOR SHORTNESS OF BREATH 180 mL 1   allopurinol (ZYLOPRIM) 300 MG tablet Take 1 tablet (300 mg total) by mouth daily. 90 tablet 1   amiodarone (PACERONE) 200 MG tablet Take 2 tablets (400 mg total) by mouth 2 (two) times daily for 5 days, THEN 1 tablet (200 mg total) 2 (two) times daily for 20 days. (Patient taking differently: Take 1 tablets (200 mg total) by mouth 2 (two) times daily  until  09-30-23, THEN 1 tablet (200 mg total)  daily ) 60 tablet 0   aspirin 81 MG chewable tablet Chew 81 mg by mouth in the morning.     buPROPion (WELLBUTRIN XL) 300 MG 24 hr tablet Take 300 mg by mouth daily.     dapagliflozin propanediol (FARXIGA) 10 MG TABS tablet Take 1 tablet (10 mg total) by mouth daily before breakfast. 30 tablet 11   fenofibrate (TRICOR) 48 MG tablet Take 1 tablet (48 mg total) by mouth daily. 90 tablet 3   losartan (COZAAR) 25 MG tablet Take 0.5 tablets (12.5 mg total) by mouth at bedtime. 45 tablet 3   metFORMIN (GLUCOPHAGE-XR) 500 MG 24 hr tablet Take 1 tablet (500 mg total) by mouth daily with breakfast. 90 tablet 1   nortriptyline (PAMELOR) 10 MG capsule Take 1 capsule (10 mg total) by mouth at bedtime. 90 capsule 1   nystatin (MYCOSTATIN) 100000 UNIT/ML suspension 5mL swish and spit TID 60 mL 1   nystatin (MYCOSTATIN/NYSTOP) powder Apply 1 Application topically 3 (three) times daily. 15 g 3   Omega-3 Fatty Acids (OMEGA 3 PO) Take 1,500 mg by mouth 2 (two) times daily.     OZEMPIC, 1 MG/DOSE, 4 MG/3ML SOPN Inject 1 mg into the skin once a week. On Mondays     potassium chloride SA (KLOR-CON M) 20 MEQ tablet Take 20 mEq by mouth at bedtime.     predniSONE  (DELTASONE) 50 MG tablet Take 1 tablet (50 mg total) by mouth daily with breakfast. 5 tablet 0   rosuvastatin (CRESTOR) 40 MG tablet Take 1 tablet (40 mg total) by mouth daily. 90 tablet 3   Spacer/Aero-Holding Chambers (AEROCHAMBER MV) inhaler Use as instructed 1 each 1   Spacer/Aero-Holding Chambers DEVI Use as directed Dx: COPD, J44.9 1 each 1   spironolactone (ALDACTONE) 25 MG tablet Take 0.5 tablets (12.5 mg total) by mouth daily. 45 tablet 3   torsemide (DEMADEX) 20 MG tablet Take 40 mg by mouth in the morning.     TRELEGY ELLIPTA 100-62.5-25 MCG/ACT AEPB Inhale 1 puff by mouth once daily 180 each 1   VENTOLIN HFA 108 (90 Base) MCG/ACT inhaler INHALE 2 PUFFS BY MOUTH EVERY 6 HOURS AS NEEDED FOR WHEEZING OR SHORTNESS OF BREATH 54 g 6   warfarin (COUMADIN) 1 MG tablet Take 0.5 tablets (0.5 mg total) by mouth daily. 30 tablet 2   warfarin (COUMADIN) 3 MG tablet Take 1 tablet (3 mg total) by mouth daily. 30 tablet 3   No current facility-administered medications for this visit.     Physical Exam: ***  Diagnostic Tests: ***  Impression: ***  Plan: ***   Alleen Borne,  MD [facility] [phone]

## 2023-09-19 NOTE — Patient Outreach (Signed)
 Care Management  Transitions of Care Program Transitions of Care Post-discharge week 3   09/19/2023 Name: Roberto [name] MRN: [id number] DOB: 05-18-1956  Subjective: Roberto [name] is a 68 y.o. year old male who is a primary care patient of Roberto Carrow, DO. The Care Management team Engaged with patient Engaged with patient by telephone to assess and address transitions of care needs.   Consent to Services:  Patient was given information about care management services, agreed to services, and gave verbal consent to participate.   Assessment:   Patient/ Caregiver  voices no new complaints or concerns  and has not developed/ reported any new medical issues / Dx or acute changes. - since last follow-up call for most recent   Hospital stay 2/24-2/27 2025  No reported or reviewed Hospital Readmissions / No Urgent Care Visits / Transfers for Acute Change in condition .  MD/Specialist or Consultant visits reported since last follow-up call- Cardiology 3/19 no changes Follow-up 1 year PCP visits for Coumadin management most recent 3/14. He has a New INR and Nebulizer machine pending delivery In the interim he is followed by his PCP for INR checks . They expressed increased satisfaction with these visits and feel his PCP is much more attentive and thorough   Patient was Alert & O x 4 ,responsive, able to voice needs, cooperative with visit   There were no noted or reported cognitive/ mental status changes during this assessment General status reviewed  patient presents as expected for age and current situation with  finding as noted in previous sections of assessment  Patient denies changes in ADL function or IADLs  at this time. He still has a 24/7 caregiver/ friend Roberto [name] who is very supportive and assists with all his ADLs and iADL's. He has new hearing aids on order. He wished to see an opthal withinhis plan to get new glasses.   Patient mood, expressions, emotional tone, insight, judgement  and  thought content as expected with no notable or reported change He is aware of recent MD visits , changes in medications and follow-up visits  No reports of new onset or increased / uncontrolled pain, has chronic pain managed with medicinal and non-medicinal interventions.  No changes in skin, no rashes, wounds or open areas  Diet reviewed and education provided as needed to promote good lifestyle choices and healthy meals. Such- as eating plenty of whole grains, nuts, vegetables, and fruits, lean meats, poultry, fish, beans, eggs, and nuts sizes (Unless otherwise restricted) Limit saturated and trans fats, sodium, and added sugars. Make sure to drink plenty of fluids every day. Be conscious of  portion sizes  He is doing well and has lost almost  20 lbs ( [phone]) . He is being treated for oral thrush ( Nystatin suspension ) Reminded them to rinse mouth after Inhalers and Nebulizer treatments  Therapy Services NA  He does have a walker on order thru [employer], this will help with his mobility and increase independence   Medication reconciliation/ review completed based on most recent medication list in EHR; and in review of recent Provider follow-up appointments- confirmed patient obtained / is taking all newly prescribed medications as instructed and is aware of any changes to previous medications regimen including dosage adjustments.  Reports  new changes in medication regimen or treatment plan during this review period.  Changes noted-decreased Coumadin to 1mg  due to supratheraputic readings - this is adjusted based on INR 3 x week  Medications and current treatments are  effective for use intended. No adverse Drug reactions  reported   Caregiver manages  medications Patient is able to take medications without difficulty;  denies questions/ concerns and reports no barriers to medication adherence at this time   Patient / Caregiver educated on red flag s/s to watch for based on current discharge  diagnosis and was encouraged to report, any changes in baseline or  medication regimen,   or any new unmanaged side effects or symptoms not relieved with interventions  to PCP and / or the  [facility] Case Management team .          SDOH Interventions    Flowsheet Row Telemedicine from 09/05/2023 in [facility] Clinical Support from 09/04/2023 in [facility] Telephone from 09/03/2023 in [place] POPULATION HEALTH DEPARTMENT Video Visit from 07/27/2023 in [facility] [place] [facility] Telephone from 06/29/2023 in [place] POPULATION HEALTH DEPARTMENT Office Visit from 04/10/2023 in [facility]  SDOH Interventions        Food Insecurity Interventions -- Intervention Not Indicated AMB Referral -- Intervention Not Indicated --  Housing Interventions -- Intervention Not Indicated Intervention Not Indicated -- Intervention Not Indicated --  Transportation Interventions -- Intervention Not Indicated Intervention Not Indicated, Patient Resources (Friends/Family) -- Intervention Not Indicated --  Utilities Interventions -- AMB Referral AMB Referral -- Intervention Not Indicated --  Alcohol Usage Interventions -- Intervention Not Indicated (Score <7) -- -- -- --  Depression Interventions/Treatment  Currently on Treatment Currently on Treatment -- Currently on Treatment -- Medication, Currently on Treatment  Financial Strain Interventions -- Intervention Not Indicated -- -- -- --  Physical Activity Interventions -- Patient Declined -- -- -- --  Stress Interventions -- Intervention Not Indicated -- -- -- --  Social Connections Interventions -- Intervention Not Indicated -- -- -- --  Health Literacy Interventions -- Intervention Not Indicated -- -- -- --        Goals Addressed             This Visit's Progress    TOC Care Plan       Current Barriers:  Medication access new medication Medication management   Diet/Nutrition/Food Resources Meal assistance - follow-up with MM SW  Provider appointments PCP, Cardiology and GI Equipment/DME Walker, Nebulizer , INR- Neb and INR machine pending delivery  Walker thru [employer] ordered   Functional/Safety Falls High Risk medications  Care Coordination needs related to Financial constraints , Limited social support( no family) , Limited access to food, ADL IADL limitations, Family and relationship dysfunction, and Lacks knowledge of community resource: Chronic Disease Management support and education needs related to Atrial Fibrillation and Pulmonary Disease   RNCM Clinical Goal(s):  Patient will work with the Care Management team over the next 30 days to address Transition of Care Barriers: Medication access Medication Management Diet/Nutrition/Food Resources Support at home Provider appointments Equipment/DME take all medications exactly as prescribed and will call provider for medication related questions as evidenced by no missed medication doses  attend all scheduled medical appointments: as evidenced by no missed follow-up visits  work with Child psychotherapist to address management of Financial constraints related to  Limited social support, Limited access to food, ADL IADL limitations, and Lacks knowledge of community resource through collaboration with SW , provider, and care team   Work with RN CM on the management of Atrial Fibrillation as evidenced by review of EMR and  through collaboration with Medical illustrator, provider,  and care team.   Interventions: Evaluation of current treatment plan related to  self management and patient's adherence to plan as established by provider  Transitions of Care:  Goal on track:  Yes. Doctor Visits  - discussed the importance of doctor visits Troubleshoot use of DME in the home  Post discharge activity limitations prescribed by provider reviewed Reviewed Signs and symptoms of infection  AFIB Interventions: (Status:   Goal on track:  Yes.) Short Term Goal   Reviewed importance of adherence to anticoagulant exactly as prescribed Advised patient to discuss respiratory symptoms, need for additional equipment and supplies  with provider Counseled on bleeding risk associated with Coumadin  and importance of self-monitoring for signs/symptoms of bleeding Counseled on importance of regular laboratory monitoring as prescribed Counseled on seeking medical attention after a head injury or if there is blood in the urine/stool Screening for signs and symptoms of depression related to chronic disease state  Assessed social determinant of health barriers  Patient Goals/Self-Care Activities: Participate in Transition of Care Program/Attend TOC scheduled calls Take all medications as prescribed Attend all scheduled provider appointments Call pharmacy for medication refills 3-7 days in advance of running out of medications Perform all self care activities independently  Perform IADL's (shopping, preparing meals, housekeeping, managing finances) independently Call provider office for new concerns or questions  Work with the social worker to address care coordination needs and will continue to work with the clinical team to address health care and disease management related needs  Follow Up Plan:  Telephone follow up appointment with care management team member scheduled for:  09/26/22 @ 11:00am  The patient has been provided with contact information for the care management team and has been advised to call with any health related questions or concerns.          Plan:  Have SW follow up with patient Needs referral to in-network Ophthalmologist Continue in office INR checks until new machine is delivered and calibrated Continue Inhalers and Nebulizer as directed , rinse mouth after each use, Finish Nystatin suspension  for oral thrush Obtain walker from [employer]- may need to consider Home Health referral for PT     Routine follow-up and on-going assessment evaluation and education of disease processes, recommended interventions for both chronic and acute medical conditions , will occur during each weekly visit along with ongoing review of symptoms ,medication reviews and reconciliation. Any updates , inconsistencies, discrepancies or acute care concerns will be addressed and routed to the correct Practitioner if indicated   Based on current information and Insurance plan -Reviewed benefits available to patient, including details about eligibility options for care if any area of needs were identified.  Reviewed patients ability to access and / or navigating the benefits system..Amb Referral made if indicted , refer to orders section of note for details   Please refer to Care Plan for goals and interventions -Effectiveness of interventions, symptom management and outcomes will be evaluated  weekly during [facility] 30-day Program Outreach calls  . Any necessary  changes and updates to Care Plan will be completed episodically    Reviewed goals for care Patient verbalizes understanding of instructions and care plan provided. Patient was encouraged to make informed decisions about their care, actively participate in managing their health condition, and implement lifestyle changes as needed to promote independence and self-management of health care   Patient was encouraged to Contact PCP with any changes in baseline or  medication regimen,  changes in health status  /  well-being, safety concerns, including falls any questions or concerns regarding ongoing medical care, any difficulty obtaining or picking up prescriptions, any changes or worsening in condition- including  symptoms not relieved  with interventions    The patient has been provided with contact information for the care management team and has been advised to call with any health-related questions or concerns. Follow up call  with Care Team  as scheduled,or  sooner should any new problems arise.   The patient has been provided with contact information for the care management team and has been advised to call with any health related questions or concerns.   Susa Loffler , BSN, RN [facility]   [facility]BC[facility]-Population Health RN Care Manager Direct Dial [phone]  Fax: [phone] Website: [email or web address]

## 2023-09-19 NOTE — Patient Instructions (Addendum)
 Visit Information  Thank you for taking time to visit with me today. Please don't hesitate to contact me if I can be of assistance to you before our next scheduled telephone appointment.  Our next appointment is by telephone on 3/26 at 11:00am  Assessment:   Patient/ Caregiver  voices no new complaints or concerns  and has not developed/ reported any new medical issues / Dx or acute changes. - since last follow-up call for most recent   Hospital stay 2/24-2/27 2025  No reported or reviewed Hospital Readmissions / No Urgent Care Visits / Transfers for Acute Change in condition .  MD/Specialist or Consultant visits reported since last follow-up call- Cardiology 3/19 no changes Follow-up 1 year PCP visits for Coumadin management most recent 3/14. He has a New INR and Nebulizer machine pending delivery In the interim he is followed by his PCP for INR checks . They expressed increased satisfaction with these visits and feel his PCP is much more attentive and thorough   Patient was Alert & O x 4 ,responsive, able to voice needs, cooperative with visit   There were no noted or reported cognitive/ mental status changes during this assessment General status reviewed  patient presents as expected for age and current situation with  finding as noted in previous sections of assessment  Patient denies changes in ADL function or IADLs  at this time. He still has a 24/7 caregiver/ friend Selena Batten who is very supportive and assists with all his ADLs and iADL's. He has new hearing aids on order. He wished to see an opthal withinhis plan to get new glasses.   Patient mood, expressions, emotional tone, insight, judgement  and thought content as expected with no notable or reported change He is aware of recent MD visits , changes in medications and follow-up visits  No reports of new onset or increased / uncontrolled pain, has chronic pain managed with medicinal and non-medicinal interventions.  No changes in skin, no  rashes, wounds or open areas  Diet reviewed and education provided as needed to promote good lifestyle choices and healthy meals. Such- as eating plenty of whole grains, nuts, vegetables, and fruits, lean meats, poultry, fish, beans, eggs, and nuts sizes (Unless otherwise restricted) Limit saturated and trans fats, sodium, and added sugars. Make sure to drink plenty of fluids every day. Be conscious of  portion sizes  He is doing well and has lost almost  20 lbs ( 811-914) . He is being treated for oral thrush ( Nystatin suspension ) Reminded them to rinse mouth after Inhalers and Nebulizer treatments  Therapy Services NA  He does have a walker on order thru [employer], this will help with his mobility and increase independence   Medication reconciliation/ review completed based on most recent medication list in EHR; and in review of recent Provider follow-up appointments- confirmed patient obtained / is taking all newly prescribed medications as instructed and is aware of any changes to previous medications regimen including dosage adjustments.  Reports  new changes in medication regimen or treatment plan during this review period.  Changes noted-decreased Coumadin to 1mg  due to supratheraputic readings - this is adjusted based on INR 3 x week  Medications and current treatments are effective for use intended. No adverse Drug reactions  reported   Caregiver manages  medications Patient is able to take medications without difficulty;  denies questions/ concerns and reports no barriers to medication adherence at this time  Patient / Caregiver educated on red  flag s/s to watch for based on current discharge diagnosis and was encouraged to report, any changes in baseline or  medication regimen,   or any new unmanaged side effects or symptoms not relieved with interventions  to PCP and / or the  [facility] Case Management team .       Following is a copy of your care plan:   Goals Addressed              This Visit's Progress    TOC Care Plan       Current Barriers:  Medication access new medication Medication management  Diet/Nutrition/Food Resources Meal assistance - follow-up with MM SW  Provider appointments PCP, Cardiology and GI Equipment/DME Walker, Nebulizer , INR- Neb and INR machine pending delivery  Walker thru [employer] ordered   Functional/Safety Falls High Risk medications  Care Coordination needs related to Financial constraints , Limited social support( no family) , Limited access to food, ADL IADL limitations, Family and relationship dysfunction, and Lacks knowledge of community resource: Chronic Disease Management support and education needs related to Atrial Fibrillation and Pulmonary Disease   RNCM Clinical Goal(s):  Patient will work with the Care Management team over the next 30 days to address Transition of Care Barriers: Medication access Medication Management Diet/Nutrition/Food Resources Support at home Provider appointments Equipment/DME take all medications exactly as prescribed and will call provider for medication related questions as evidenced by no missed medication doses  attend all scheduled medical appointments: as evidenced by no missed follow-up visits  work with Child psychotherapist to address management of Financial constraints related to  Limited social support, Limited access to food, ADL IADL limitations, and Lacks knowledge of community resource through collaboration with SW , provider, and care team   Work with RN CM on the management of Atrial Fibrillation as evidenced by review of EMR and  through collaboration with Medical illustrator, provider, and care team.   Interventions: Evaluation of current treatment plan related to  self management and patient's adherence to plan as established by provider  Transitions of Care:  Goal on track:  Yes. Doctor Visits  - discussed the importance of doctor visits Troubleshoot use of DME in the home  Post discharge  activity limitations prescribed by provider reviewed Reviewed Signs and symptoms of infection  AFIB Interventions: (Status:  Goal on track:  Yes.) Short Term Goal   Reviewed importance of adherence to anticoagulant exactly as prescribed Advised patient to discuss respiratory symptoms, need for additional equipment and supplies  with provider Counseled on bleeding risk associated with Coumadin  and importance of self-monitoring for signs/symptoms of bleeding Counseled on importance of regular laboratory monitoring as prescribed Counseled on seeking medical attention after a head injury or if there is blood in the urine/stool Screening for signs and symptoms of depression related to chronic disease state  Assessed social determinant of health barriers  Patient Goals/Self-Care Activities: Participate in Transition of Care Program/Attend TOC scheduled calls Take all medications as prescribed Attend all scheduled provider appointments Call pharmacy for medication refills 3-7 days in advance of running out of medications Perform all self care activities independently  Perform IADL's (shopping, preparing meals, housekeeping, managing finances) independently Call provider office for new concerns or questions  Work with the social worker to address care coordination needs and will continue to work with the clinical team to address health care and disease management related needs  Follow Up Plan:  Telephone follow up appointment with care management team member  scheduled for:  09/26/22 @ 11:00am  The patient has been provided with contact information for the care management team and has been advised to call with any health related questions or concerns.          Medication review  Reviewed current home medications -- provided education as needed. Patient is aware of potential side effects and was encouraged to notify PCP for any adverse side effects or unwanted symptoms not relieved with  interventions  Patient will call 911 for Medical Emergencies or Life -Threatening Symptoms.  Reviewed goals for care Patient/ Caregiver verbalizes understanding of instructions with the plan of care . The  Patient / Caregiver was encouraged to make informed decisions about care, actively participate in managing health conditions, and implement lifestyle changes as needed to promote independence and self-management of healthcare. SDOH screenings have been completed and addressed if indicted.  There are no reported barriers to care.    Follow-up Plan  Have SW follow up with patient as soon  as possible  Needs referral to in-network Ophthalmologist Continue in office INR checks until new machine is delivered and calibrated Continue Inhalers and Nebulizer as directed , rinse mouth after each use, Finish Nystatin suspension  for oral thrush Obtain walker from [employer]- may need to consider Home Health referral for PT  Keep PCP follow-up visits  Cardiology 1 year   [facility] Case Management Nurse will provide follow-up and on-going assessment ,evaluation and education of disease processes, recommended interventions for both chronic and acute medical conditions ,  along with ongoing review of symptoms ,medication reviews / reconciliation during each weekly call . Any updates , inconsistencies, discrepancies or acute care concerns will be addressed and routed to the correct Practitioner if indicated   The patient has been provided with contact information for the care management team and has been advised to call with any health-related questions or concerns. Follow up call  with Care Team  as scheduled,or sooner should any new problems arise.  [facility]  Please call the care guide team at [phone]  if you need to cancel or reschedule your appointment . For scheduled calls -Three attempts will be made to reach you -if the scheduled call is missed or  we are unable to reach the you after 3  attempts no additional outreach attempts will be made and the TOC follow-up will be closed .   If you need to speak to a Nurse you may  call me directly at the number below or if I am unavailable,and  your need is urgent  please call the main [facility] number at [phone] and ask to speak with one of the [facility] ( Transition of Care )  Nurses  .  Patient was encouraged to Contact PCP with any changes in baseline or  medication regimen,  changes in health status  /  well-being, safety concerns, including falls any questions or concerns regarding ongoing medical care, any difficulty obtaining or picking up prescriptions, any changes or worsening in condition- including  symptoms not relieved  with interventions  Additionally, If you experience worsening of your symptoms, develop shortness of breath, If you are experiencing a medical emergency,  develop suicidal or homicidal thoughts you must seek medical attention immediately by calling 911 or report to your local emergency department or urgent care.   If you have a non-emergency medical problem during routine business hours, please contact your provider's office and ask to speak with a nurse.       Please take the time to read instructions/literature along with the possible adverse reactions/side effects for all the Medicines that have been prescribed to you. Only take newly prescribed  Medications after you have completely understood and accept all the possible adverse reactions/side effects.   Do not take more than prescribed Medications for  Pain, Sleep and Anxiety. Do not drive when taking Pain medications or sleep aid/ insomnia  medications It is not advisable to combine anxiety, sleep and pain medications without talking with your primary care practitioner    If you are experiencing a Mental Health or Behavioral Health Crisis or need someone to talk to Please call the Suicide  and Crisis Lifeline: 988 You may also call the [place] National Suicide Prevention Lifeline: 365-837-8540 or TTY: 938 609 6319 TTY (440)086-5153) to talk to a trained counselor.  You may call the Behavioral Health Crisis Line at 613-476-6211, at any time, 24 hours a day, 7 days a week- however If you are in danger or need immediate medical attention, call 911.   If you would like help to quit smoking, call 1-800-QUIT-NOW ( (302)182-5222) OR Espaol: 1-855-Djelo-Ya (7-253-664-4034) o para ms informacin haga clic aqu or Text READY to 742-595 to register via text.   Susa Loffler , BSN, RN [place]   [facility] RN Care Manager Direct Dial [phone]  Website: [email or web address]

## 2023-09-20 ENCOUNTER — Ambulatory Visit (INDEPENDENT_AMBULATORY_CARE_PROVIDER_SITE_OTHER): Admitting: Family Medicine

## 2023-09-20 ENCOUNTER — Encounter: Payer: Self-pay | Admitting: Family Medicine

## 2023-09-20 VITALS — BP 100/66 | HR 73 | Temp 98.4°F | Resp 16 | Ht 65.0 in | Wt 252.4 lb

## 2023-09-20 DIAGNOSIS — Z952 Presence of prosthetic heart valve: Secondary | ICD-10-CM | POA: Diagnosis not present

## 2023-09-20 LAB — COAGUCHEK XS/INR WAIVED
INR: 3.1 — ABNORMAL HIGH (ref 0.9–1.1)
Prothrombin Time: 37 s

## 2023-09-20 NOTE — Progress Notes (Signed)
 BP 100/66 (BP Location: Left Arm, Patient Position: Sitting, Cuff Size: Large)   Pulse 73   Temp 98.4 F (36.9 C) (Oral)   Resp 16   Ht 5\' 5"  (1.651 m)   Wt 252 lb 6.4 oz (114.5 kg)   SpO2 98%   BMI 42.00 kg/m    Subjective:    Patient ID: Roberto [name], male    DOB: [birth year], 68 y.o.   MRN: [id number]  CC: Coumadin management  HPI: This patient is a 68 y.o. male who presents for coumadin management. The expected duration of coumadin treatment is lifelong The reason for anticoagulation is  mechanical heart valve.  Present Coumadin dose: 1mg  daily Goal: 2.5-3.5 2.5-3.5 Excessive bruising: no Nose bleeding: no Rectal bleeding: no Prolonged menstrual cycles: N/A Eating diet with consistent amounts of foods containing Vitamin K:yes Any recent antibiotic use? no  Relevant past medical, surgical, family and social history reviewed and updated as indicated. Interim medical history since our last visit reviewed. Allergies and medications reviewed and updated.  Review of Systems  Constitutional: Negative.   Respiratory: Negative.    Cardiovascular: Negative.   Psychiatric/Behavioral: Negative.          Objective:    BP 100/66 (BP Location: Left Arm, Patient Position: Sitting, Cuff Size: Large)   Pulse 73   Temp 98.4 F (36.9 C) (Oral)   Resp 16   Ht 5\' 5"  (1.651 m)   Wt 252 lb 6.4 oz (114.5 kg)   SpO2 98%   BMI 42.00 kg/m   Wt Readings from Last 3 Encounters:  09/20/23 252 lb 6.4 oz (114.5 kg)  09/19/23 249 lb (112.9 kg)  09/17/23 251 lb 12.8 oz (114.2 kg)     Physical Exam Vitals and nursing note reviewed.  Constitutional:      General: He is not in acute distress.    Appearance: Normal appearance. He is obese. He is not ill-appearing, toxic-appearing or diaphoretic.  HENT:     Head: Normocephalic and atraumatic.     Right Ear: External ear normal.     Left Ear: External ear normal.     Nose: Nose normal. No congestion or rhinorrhea.     Mouth/Throat:      Mouth: Mucous membranes are moist.     Pharynx: Oropharynx is clear.  Eyes:     Extraocular Movements: Extraocular movements intact.     Conjunctiva/sclera: Conjunctivae normal.     Pupils: Pupils are equal, round, and reactive to light.  Cardiovascular:     Rate and Rhythm: Normal rate and regular rhythm.     Pulses: Normal pulses.     Heart sounds: Normal heart sounds. No murmur heard.    No friction rub. No gallop.     Comments: + click Pulmonary:     Effort: Pulmonary effort is normal. No respiratory distress.     Breath sounds: Normal breath sounds. No stridor. No wheezing, rhonchi or rales.  Chest:     Chest wall: No tenderness.  Musculoskeletal:        General: Normal range of motion.  Skin:    General: Skin is warm and dry.     Capillary Refill: Capillary refill takes less than 2 seconds.     Coloration: Skin is not jaundiced or pale.     Findings: No bruising, erythema, lesion or rash.  Neurological:     General: No focal deficit present.     Mental Status: He is alert and oriented to person, place, and  time. Mental status is at baseline.     Cranial Nerves: No cranial nerve deficit.     Sensory: No sensory deficit.     Motor: No weakness.     Coordination: Coordination normal.     Gait: Gait normal.     Deep Tendon Reflexes: Reflexes normal.  Psychiatric:        Mood and Affect: Mood normal.        Behavior: Behavior normal.        Thought Content: Thought content normal.        Judgment: Judgment normal.     Last INR: 3.1 Last PT: 37.0    Last CBC:  Lab Results  Component Value Date   WBC 12.3 (H) 09/11/2023   HGB 16.0 09/11/2023   HCT 51.2 (H) 09/11/2023   MCV 88 09/11/2023   PLT 412 09/11/2023    Results for orders placed or performed in visit on 09/17/23  CoaguChek XS/INR Waived   Collection Time: 09/17/23  8:27 AM  Result Value Ref Range   INR 2.4 (H) 0.9 - 1.1   Prothrombin Time 29.4 sec   *Note: Due to a large number of results  and/or encounters for the requested time period, some results have not been displayed. A complete set of results can be found in Results Review.       Assessment:     ICD-10-CM   1. H/O mechanical aortic valve replacement  Z95.2 CoaguChek XS/INR Waived      Plan:   Discussed current plan face-to-face with patient. For coumadin dosing, elected to continue current dose. Will plan to recheck INR in 1 week.

## 2023-09-24 ENCOUNTER — Telehealth: Payer: Self-pay | Admitting: *Deleted

## 2023-09-24 LAB — HM DIABETES EYE EXAM

## 2023-09-24 NOTE — Progress Notes (Signed)
 Complex Care Management Care Guide Note  09/24/2023 Name: [name] MRN: [id number] DOB: August 28, 1955  [name] is a 68 y.o. year old male who is a primary care patient of [name], DO and is actively engaged with the care management team. I reached out to [name] by phone today to assist with re-scheduling  with the BSW.  Follow up plan: declined to reschedule   Burman Nieves, CMA, Care Guide [facility]  [facility], [facility] Guide Direct Dial: [phone]  Fax: [phone] Website: [email or web address]

## 2023-09-24 NOTE — Progress Notes (Signed)
 Complex Care Management Care Guide Note  09/24/2023 Name: Roberto [name] MRN: [id number] DOB: January 30, 1956  Roberto [name] is a 68 y.o. year old male who is a primary care patient of [name], DO and is actively engaged with the care management team. I reached out to [name] by phone today to assist with re-scheduling  with the BSW.  Follow up plan: Unsuccessful telephone outreach attempt made. A HIPAA compliant phone message was left for the patient providing contact information and requesting a return call.  Burman Nieves, CMA, Care Guide [facility]  [facility], [facility] Guide Direct Dial: [phone]  Fax: [phone] Website: [email or web address]

## 2023-09-25 ENCOUNTER — Other Ambulatory Visit: Payer: Self-pay

## 2023-09-25 ENCOUNTER — Encounter: Payer: Self-pay | Admitting: Internal Medicine

## 2023-09-25 ENCOUNTER — Ambulatory Visit (INDEPENDENT_AMBULATORY_CARE_PROVIDER_SITE_OTHER): Payer: 59

## 2023-09-25 DIAGNOSIS — I428 Other cardiomyopathies: Secondary | ICD-10-CM

## 2023-09-25 LAB — CUP PACEART REMOTE DEVICE CHECK
Battery Remaining Longevity: 118 mo
Battery Remaining Percentage: 94 %
Battery Voltage: 3.05 V
Brady Statistic RV Percent Paced: 1 %
Date Time Interrogation Session: 20250325030140
HighPow Impedance: 83 Ohm
Implantable Lead Connection Status: 753985
Implantable Lead Implant Date: 20241223
Implantable Lead Location: 753860
Implantable Lead Model: 7122
Implantable Pulse Generator Implant Date: 20241223
Lead Channel Impedance Value: 540 Ohm
Lead Channel Pacing Threshold Amplitude: 0.5 V
Lead Channel Pacing Threshold Pulse Width: 0.5 ms
Lead Channel Sensing Intrinsic Amplitude: 12 mV
Lead Channel Setting Pacing Amplitude: 2.5 V
Lead Channel Setting Pacing Pulse Width: 0.5 ms
Lead Channel Setting Sensing Sensitivity: 0.5 mV
Pulse Gen Serial Number: 111074223

## 2023-09-25 MED ORDER — ALBUTEROL SULFATE (2.5 MG/3ML) 0.083% IN NEBU: INHALATION_SOLUTION | RESPIRATORY_TRACT | 1 refills | Status: DC

## 2023-09-26 ENCOUNTER — Telehealth: Payer: Self-pay | Admitting: Cardiology

## 2023-09-26 ENCOUNTER — Encounter: Payer: Self-pay | Admitting: Family Medicine

## 2023-09-26 ENCOUNTER — Telehealth: Payer: Self-pay

## 2023-09-26 ENCOUNTER — Ambulatory Visit (INDEPENDENT_AMBULATORY_CARE_PROVIDER_SITE_OTHER): Admitting: Family Medicine

## 2023-09-26 ENCOUNTER — Other Ambulatory Visit: Payer: Self-pay

## 2023-09-26 VITALS — BP 89/58 | HR 62 | Temp 98.5°F | Resp 16 | Ht 65.0 in | Wt 256.8 lb

## 2023-09-26 DIAGNOSIS — I152 Hypertension secondary to endocrine disorders: Secondary | ICD-10-CM

## 2023-09-26 DIAGNOSIS — Z952 Presence of prosthetic heart valve: Secondary | ICD-10-CM | POA: Diagnosis not present

## 2023-09-26 DIAGNOSIS — I5022 Chronic systolic (congestive) heart failure: Secondary | ICD-10-CM

## 2023-09-26 DIAGNOSIS — I4891 Unspecified atrial fibrillation: Secondary | ICD-10-CM

## 2023-09-26 DIAGNOSIS — I472 Ventricular tachycardia, unspecified: Secondary | ICD-10-CM

## 2023-09-26 DIAGNOSIS — I428 Other cardiomyopathies: Secondary | ICD-10-CM

## 2023-09-26 DIAGNOSIS — E1159 Type 2 diabetes mellitus with other circulatory complications: Secondary | ICD-10-CM

## 2023-09-26 LAB — COAGUCHEK XS/INR WAIVED
INR: 5.1 (ref 0.9–1.1)
Prothrombin Time: 60.9 s

## 2023-09-26 MED ORDER — NYSTATIN 100000 UNIT/ML MT SUSP: OROMUCOSAL | 1 refills | Status: DC

## 2023-09-26 NOTE — Telephone Encounter (Incomplete)
 Called to confirm/remind patient of [facility] on [facility]***.   Appointment:   [x] Confirmed  [] Left mess   [] No answer/No voice mail  [] Phone not in service  Patient reminded to bring all medications and/or complete list.  Confirmed patient has transportation. Gave directions, instructed to utilize valet parking.

## 2023-09-26 NOTE — Assessment & Plan Note (Signed)
 INR 5.1- Will cut down to 0.5/1mg  alternating and recheck in 5 days.

## 2023-09-26 NOTE — Progress Notes (Signed)
 BP (!) 89/58 (BP Location: Left Arm, Patient Position: Sitting, Cuff Size: Large)   Pulse 62   Temp 98.5 F (36.9 C) (Oral)   Resp 16   Ht 5\' 5"  (1.651 m)   Wt 256 lb 12.8 oz (116.5 kg)   SpO2 98%   BMI 42.73 kg/m    Subjective:    Patient ID: Roberto [name], male    DOB: 1956/05/27, 68 y.o.   MRN: [id number]  HPI: Roberto [name] is a 68 y.o. male  Chief Complaint  Patient presents with   Coagulation Disorder    3.5 on Monday at home.    Thursh    A lot better but not resolved. On his 2nd bottle of medication.    Coumadin Management.  The expected duration of coumadin treatment is lifelong The reason for anticoagulation is  mechanical heart valve.  Present Coumadin dose: 1mg   Goal: 2.5-3.5  Excessive bruising: no Nose bleeding: no Rectal bleeding: no Prolonged menstrual cycles: N/A Eating diet with consistent amounts of foods containing Vitamin K:no Any recent antibiotic use? no  Relevant past medical, surgical, family and social history reviewed and updated as indicated. Interim medical history since our last visit reviewed. Allergies and medications reviewed and updated.  Review of Systems  Constitutional: Negative.   Respiratory: Negative.    Cardiovascular: Negative.   Gastrointestinal: Negative.   Musculoskeletal: Negative.   Psychiatric/Behavioral: Negative.     Per HPI unless specifically indicated above  LAST INR: 5.1 LAST PT: 60.9     Objective:    BP (!) 89/58 (BP Location: Left Arm, Patient Position: Sitting, Cuff Size: Large)   Pulse 62   Temp 98.5 F (36.9 C) (Oral)   Resp 16   Ht 5\' 5"  (1.651 m)   Wt 256 lb 12.8 oz (116.5 kg)   SpO2 98%   BMI 42.73 kg/m   Wt Readings from Last 3 Encounters:  09/26/23 256 lb 12.8 oz (116.5 kg)  09/20/23 252 lb 6.4 oz (114.5 kg)  09/19/23 249 lb (112.9 kg)    Physical Exam Vitals and nursing note reviewed.  Constitutional:      General: He is not in acute distress.    Appearance: Normal  appearance. He is not ill-appearing, toxic-appearing or diaphoretic.  HENT:     Head: Normocephalic and atraumatic.     Right Ear: External ear normal.     Left Ear: External ear normal.     Nose: Nose normal.     Mouth/Throat:     Mouth: Mucous membranes are moist.     Pharynx: Oropharynx is clear.  Eyes:     General: No scleral icterus.       Right eye: No discharge.        Left eye: No discharge.     Extraocular Movements: Extraocular movements intact.     Conjunctiva/sclera: Conjunctivae normal.     Pupils: Pupils are equal, round, and reactive to light.  Cardiovascular:     Rate and Rhythm: Normal rate and regular rhythm.     Pulses: Normal pulses.     Heart sounds: Normal heart sounds. No murmur heard.    No friction rub. No gallop.     Comments: +click Pulmonary:     Effort: Pulmonary effort is normal. No respiratory distress.     Breath sounds: Normal breath sounds. No stridor. No wheezing, rhonchi or rales.  Chest:     Chest wall: No tenderness.  Musculoskeletal:  General: Normal range of motion.     Cervical back: Normal range of motion and neck supple.  Skin:    General: Skin is warm and dry.     Capillary Refill: Capillary refill takes less than 2 seconds.     Coloration: Skin is not jaundiced or pale.     Findings: No bruising, erythema, lesion or rash.  Neurological:     General: No focal deficit present.     Mental Status: He is alert and oriented to person, place, and time. Mental status is at baseline.  Psychiatric:        Mood and Affect: Mood normal.        Behavior: Behavior normal.        Thought Content: Thought content normal.        Judgment: Judgment normal.     Results for orders placed or performed in visit on 09/25/23  CUP PACEART REMOTE DEVICE CHECK   Collection Time: 09/25/23  3:01 AM  Result Value Ref Range   Date Time Interrogation Session [id number]    Pulse Generator Manufacturer [employer]    Pulse Gen Model [id number] GALLANT  VR    Pulse Gen Serial Number [id number]    Clinic Name [facility]    Implantable Pulse Generator Type Implantable Cardiac Defibulator    Implantable Pulse Generator Implant Date [id number]    Implantable Lead Manufacturer [facility]    Implantable Lead Model 7122 Durata    Implantable Lead Serial Number [id number]    Implantable Lead Implant Date [id number]    Implantable Lead Location Detail 1 UNKNOWN    Implantable Lead Location [id number]    Implantable Lead Connection Status [id number]    Lead Channel Setting Sensing Sensitivity 0.5 mV   Lead Channel Setting Sensing Adaptation Mode Adaptive Sensing    Lead Channel Setting Pacing Pulse Width 0.5 ms   Lead Channel Setting Pacing Amplitude 2.5 V   Zone Setting Status Active    Zone Setting Status Active    Zone Setting Status Active    Lead Channel Status NULL    Lead Channel Impedance Value 540 ohm   Lead Channel Sensing Intrinsic Amplitude 12.0 mV   Lead Channel Pacing Threshold Amplitude 0.5 V   Lead Channel Pacing Threshold Pulse Width 0.5 ms   HighPow Impedance 83 ohm   HighPow Imped Status NULL    Battery Status MOS    Battery Remaining Longevity 118 mo   Battery Remaining Percentage 94.0 %   Battery Voltage 3.05 V   Brady Statistic RV Percent Paced 1.0 %   *Note: Due to a large number of results and/or encounters for the requested time period, some results have not been displayed. A complete set of results can be found in Results Review.      Assessment & Plan:   Problem List Items Addressed This Visit       Other   H/O mechanical aortic valve replacement - Primary   Relevant Orders   CoaguChek XS/INR Waived     Follow up plan: No follow-ups on file.

## 2023-09-26 NOTE — Patient Outreach (Signed)
 Care Management  Transitions of Care Program Transitions of Care Post-discharge week 4 Final Call  09/26/2023 Name: [name] MRN: [id number] DOB: 1956-01-02  Subjective: Roberto [name] is a 68 y.o. year old male who is a primary care patient of [name], DO. The Care Management team was unable to reach the patient by phone to assess and address transitions of care needs.   Patient  Outreach attempt is in the course of  VBCI  30-day TOC program. Pt previously agreed and is enrolled in the  program due to potential risk for readmission and/or high utilization. Unfortunately, I was not able to speak with the patient in regards to recent hospital discharge  I was unable to Leave a message her Voice mail was full       Plan: Additional outreach attempts will be made to reach the patient enrolled in the [facility] Program (Post Inpatient/ED Visit).  Susa Loffler , BSN, RN [facility]   VBCI-Population Health RN Care Manager Direct Dial [phone]  Fax: [phone] Website: [email or web address]

## 2023-09-26 NOTE — Patient Outreach (Signed)
 Care Management  Transitions of Care Program Transitions of Care Post-discharge week 4 Program Completion    09/26/2023 Name: Roberto [name] MRN: [id number] DOB: 03-01-1956  Subjective: Roberto [name] is a 68 y.o. year old male who is a primary care patient of [name], DO. The Care Management team Engaged with patient Engaged with patient by telephone to assess and address transitions of care needs.   Consent to Services:  Patient was given information about care management services, agreed to services, and gave verbal consent to participate.   Assessment:   Patient/ Caregiver  voices no new complaints or concerns  and has not developed/ reported any new medical issues / Dx or acute changes. - since last follow-up call for most recent  Hospital stay    2/24-2/27 / 2025  No reported or reviewed Hospital Readmissions / No Urgent Care Visits / Transfers for Acute Change in condition .   MD/Specialist or Consultant visits reported since last follow-up call PCP visit today with Coumadin dose changes  He will see a New GI 10/10/23 Referral for home INR machine sent to [facility] vs [facility] due to difficulty obtaining . He received his walker   Patient was Alert & O x 4 ,responsive, able to voice needs, cooperative with visit   There were no noted or reported cognitive/ mental status changes during this assessment General status reviewed  patient presents as expected for age and current situation with  finding as noted in previous sections of assessment  Patient denies changes in ADL function or IADLs  at this time.   Patient mood, expressions, emotional tone, insight, judgement  and thought content as expected with no notable or reported change No reports of new onset or increased / uncontrolled pain, has chronic pain managed with medicinal and non-medicinal interventions.  No changes in skin, no rashes, wounds or open areas  Diet reviewed and education provided as needed to promote good  lifestyle choices and healthy meals. Such- as eating plenty of whole grains, nuts, vegetables, and fruits, lean meats, poultry, fish, beans, eggs, and nuts sizes (Unless otherwise restricted) Limit saturated and trans fats, sodium, and added sugars. Make sure to drink plenty of fluids every day. Be conscious of  portion sizes , avoid green leafy vegetables   Therapy Services NA  Medication reconciliation/ review completed based on most recent medication list in EHR; and in review of recent Provider follow-up appointments- confirmed patient obtained / is taking all newly prescribed medications as instructed and is aware of any changes to previous medications regimen including dosage adjustments.  Reports  new changes in medication regimen or treatment plan during this review period.  Changes noted- Coumadin 0.5 mg / 1mg  alternating and recheck in 5 days. Medications and current treatments are effective for use intended. No adverse Drug reactions  reported See  changes / modifications   Patient self-Caregiver manages  medications Patient is able to take medications without difficulty;  denies questions/ concerns and reports no barriers to medication adherence at this time   Patient / Caregiver educated on red flag s/s to watch for based on current discharge diagnosis and was encouraged to report, any changes in baseline or  medication regimen,   or any new unmanaged side effects or symptoms not relieved with interventions  to PCP and / or the  VBCI Case Management team .          SDOH Interventions    Flowsheet Row Office Visit from 09/26/2023 in [facility] Telemedicine from 09/05/2023 in [facility] Clinical Support from 09/04/2023 in [facility] Telephone from 09/03/2023 in [place] POPULATION HEALTH DEPARTMENT Video Visit from 07/27/2023 in [facility] Telephone from 06/29/2023 in [place]  POPULATION HEALTH DEPARTMENT  SDOH Interventions        Food Insecurity Interventions -- -- Intervention Not Indicated AMB Referral -- Intervention Not Indicated  Housing Interventions -- -- Intervention Not Indicated Intervention Not Indicated -- Intervention Not Indicated  Transportation Interventions -- -- Intervention Not Indicated Intervention Not Indicated, Patient Resources (Friends/Family) -- Intervention Not Indicated  Utilities Interventions -- -- AMB Referral AMB Referral -- Intervention Not Indicated  Alcohol Usage Interventions -- -- Intervention Not Indicated (Score <7) -- -- --  Depression Interventions/Treatment  PHQ2-9 Score <4 Follow-up Not Indicated Currently on Treatment Currently on Treatment -- Currently on Treatment --  Financial Strain Interventions -- -- Intervention Not Indicated -- -- --  Physical Activity Interventions -- -- Patient Declined -- -- --  Stress Interventions -- -- Intervention Not Indicated -- -- --  Social Connections Interventions -- -- Intervention Not Indicated -- -- --  Health Literacy Interventions -- -- Intervention Not Indicated -- -- --        Goals Addressed             This Visit's Progress    COMPLETED: TOC Care Plan       Current Barriers:  Medication access new medication Medication management  Diet/Nutrition/Food Resources Meal assistance - follow-up with MM SW  Provider appointments PCP, Cardiology and GI next visit 4/9 for colonoscopy  Equipment/DME Walker, Nebulizer , INR- Neb and INR machine pending delivery- resubmitted to [facility] ( has concerns with [facility] )  Walker thru [facility] ordered   Functional/Safety [place] High Risk medications  Care Coordination needs related to Financial constraints , Limited social support( no family) , Limited access to food, ADL IADL limitations, Family and relationship dysfunction, and Lacks knowledge of community resource: Chronic Disease Management support and education needs related to Atrial  Fibrillation and Pulmonary Disease   RNCM Clinical Goal(s):  Patient will work with the Care Management team over the next 30 days to address Transition of Care Barriers: Medication access Medication Management Diet/Nutrition/Food Resources Support at home Provider appointments Equipment/DME take all medications exactly as prescribed and will call provider for medication related questions as evidenced by no missed medication doses  attend all scheduled medical appointments: as evidenced by no missed follow-up visits  work with Child psychotherapist to address management of Financial constraints related to  Limited social support, Limited access to food, ADL IADL limitations, and Lacks knowledge of community resource through collaboration with SW , provider, and care team   Work with RN CM on the management of Atrial Fibrillation as evidenced by review of EMR and  through collaboration with Medical illustrator, provider, and care team.   Interventions: Evaluation of current treatment plan related to  self management and patient's adherence to plan as established by provider  Transitions of Care:  Goal Met. Doctor Visits  - discussed the importance of doctor visits Referral to Longitudinal Nurse Case Manager for Ongoing follow-up Troubleshoot use of DME in the home  Post discharge activity limitations prescribed by provider reviewed Reviewed Signs and symptoms of infection  AFIB Interventions: (Status:  Goal Met. and Transferred to CCM  ) Short Term Goal   Reviewed importance of adherence to anticoagulant exactly as prescribed Advised  patient to discuss respiratory symptoms, need for additional equipment and supplies  with provider Counseled on bleeding risk associated with Coumadin  and importance of self-monitoring for signs/symptoms of bleeding Counseled on importance of regular laboratory monitoring as prescribed Counseled on seeking medical attention after a head injury or if there is blood in  the urine/stool Screening for signs and symptoms of depression related to chronic disease state  Assessed social determinant of health barriers  Patient Goals/Self-Care Activities: Participate in Transition of Care Program/Attend TOC scheduled calls Take all medications as prescribed Attend all scheduled provider appointments Call pharmacy for medication refills 3-7 days in advance of running out of medications Perform all self care activities independently  Perform IADL's (shopping, preparing meals, housekeeping, managing finances) independently Call provider office for new concerns or questions  Work with the social worker to address care coordination needs and will continue to work with the clinical team to address health care and disease management related needs  Follow Up Plan:  The patient has been provided with contact information for the care management team and has been advised to call with any health related questions or concerns.  CCM enrollment status - referral made to  [facility] CCM .          Plan:  Med changes 09/26/23 INR 5.1- Will cut down to 0.5/1mg  alternating and recheck in 5 days.     VBCI SW call 3/27  Ambulatory Referral  for identified CCM needs. Please see additional details in the order section of this note  Patient / Caregiver was made aware   The Complex Care Management services include support from the care team which includes  a  Nurse Coordinator, Clinical Social Worker, ( current) and /  or Pharmacist. ( If needed  )To assist wit medication needs and concerns   The Complex Care Management team's purpose is to assist in removing   barriers to your health care and to assist  you  in achieving  the goals  that are most important to your wellbeing  Complex Care Management services are voluntary, and the patient / caregiver may decline or stop services at any time by request to their care team member.  Consent to share information   The Patient  / Caregiver  is aware and gave permission for VBCI CM  to share personal information  including TOC engagement notes with other service providers in connection with care, including accessing and sharing medical, and if applicable, mental health records. The Patient / Caregiver also agreed to a referral ( if indicated) being requested in order to support their identified needs.     The patient has successfully completed the 30-day TOC Program. Condition is stable No further acute needs identified at this time. Chronic conditions and ongoing care is  managed thru collaboration with  PCP,  Specialists and additional Healthcare Providers if indicated . Patient verbalized understanding of ongoing plan of care.  SDOH needs have been screened and interventions provided if identified.   Reviewed current home medications -- provided education as needed.  Previously discussed rationale of use, how/when to take medications. Patient is aware of potential side effects, and was encouraged to notify PCP for any changes in condition or signs / symptoms not relieved  with interventions.   Patient will call 911 for Medical Emergencies or Life -Threatening or report to a local emergency department or urgent care.   Patient was encouraged to Contact PCP  with any questions or concerns regarding ongoing  medical care, any  difficulty obtaining or picking up  prescriptions, any  changes or  worsening in  condition including signs / symptoms not relieved  with interventions Follow up as indicated with the  Care Team , or sooner should any new problems arise.   Patient had no additional questions or concerns at this time. Current needs addressed.     The patient has been provided with contact information for the care management team and has been advised to call with any health related questions or concerns.   Susa Loffler , BSN, RN [facility]   VBCI-Population Health RN Care Manager Direct Dial [phone]  Fax:  [phone] Website: [email or web address]

## 2023-09-26 NOTE — Patient Instructions (Addendum)
 Visit Information  Thank you for taking time to visit with me today. Please don't hesitate to contact me if I can be of assistance to you  Assessment:   Patient/ Caregiver  voices no new complaints or concerns  and has not developed/ reported any new medical issues / Dx or acute changes. - since last follow-up call for most recent  Hospital stay    2/24-2/27 / 2025   No reported or reviewed Hospital Readmissions / No Urgent Care Visits / Transfers for Acute Change in condition .   MD/Specialist or Consultant visits reported since last follow-up call PCP visit today with Coumadin dose changes  He will see a New GI 10/10/23 Referral for home INR machine sent to [facility] vs [facility] due to difficulty obtaining . He received his walker    Patient was Alert & O x 4 ,responsive, able to voice needs, cooperative with visit   There were no noted or reported cognitive/ mental status changes during this assessment General status reviewed  patient presents as expected for age and current situation with  finding as noted in previous sections of assessment  Patient denies changes in ADL function or IADLs  at this time.    Patient mood, expressions, emotional tone, insight, judgement  and thought content as expected with no notable or reported change No reports of new onset or increased / uncontrolled pain, has chronic pain managed with medicinal and non-medicinal interventions.  No changes in skin, no rashes, wounds or open areas  Diet reviewed and education provided as needed to promote good lifestyle choices and healthy meals. Such- as eating plenty of whole grains, nuts, vegetables, and fruits, lean meats, poultry, fish, beans, eggs, and nuts sizes (Unless otherwise restricted) Limit saturated and trans fats, sodium, and added sugars. Make sure to drink plenty of fluids every day. Be conscious of  portion sizes , avoid green leafy vegetables    Therapy Services NA   Medication reconciliation/ review completed  based on most recent medication list in EHR; and in review of recent Provider follow-up appointments- confirmed patient obtained / is taking all newly prescribed medications as instructed and is aware of any changes to previous medications regimen including dosage adjustments.   Reports  new changes in medication regimen or treatment plan during this review period.  Changes noted- Coumadin 0.5 mg / 1mg  alternating and recheck in 5 days. Medications and current treatments are effective for use intended. No adverse Drug reactions  reported See  changes / modifications   Patient self-Caregiver manages  medications Patient is able to take medications without difficulty;  denies questions/ concerns and reports no barriers to medication adherence at this time     Patient / Caregiver educated on red flag s/s to watch for based on current discharge diagnosis and was encouraged to report, any changes in baseline or  medication regimen,   or any new unmanaged side effects or symptoms not relieved with interventions  to PCP and / or the  [facility] Case Management team .       Following is a copy of your care plan:   Goals Addressed             This Visit's Progress    COMPLETED: TOC Care Plan       Current Barriers:  Medication access new medication Medication management  Diet/Nutrition/Food Resources Meal assistance - follow-up with MM SW  Provider appointments PCP, Cardiology and GI next visit 4/9 for colonoscopy  Equipment/DME Dan Humphreys,  Nebulizer , INR- Neb and INR machine pending delivery- resubmitted to [facility] ( has concerns with [facility] )  Walker thru [facility] ordered   Functional/Safety Falls High Risk medications  Care Coordination needs related to Financial constraints , Limited social support( no family) , Limited access to food, ADL IADL limitations, Family and relationship dysfunction, and Lacks knowledge of community resource: Chronic Disease Management support and education needs related to  Atrial Fibrillation and Pulmonary Disease   RNCM Clinical Goal(s):  Patient will work with the Care Management team over the next 30 days to address Transition of Care Barriers: Medication access Medication Management Diet/Nutrition/Food Resources Support at home Provider appointments Equipment/DME take all medications exactly as prescribed and will call provider for medication related questions as evidenced by no missed medication doses  attend all scheduled medical appointments: as evidenced by no missed follow-up visits  work with Child psychotherapist to address management of Financial constraints related to  Limited social support, Limited access to food, ADL IADL limitations, and Lacks knowledge of community resource through collaboration with SW , provider, and care team   Work with RN CM on the management of Atrial Fibrillation as evidenced by review of EMR and  through collaboration with Medical illustrator, provider, and care team.   Interventions: Evaluation of current treatment plan related to  self management and patient's adherence to plan as established by provider  Transitions of Care:  Goal Met. Doctor Visits  - discussed the importance of doctor visits Referral to Longitudinal Nurse Case Manager for Ongoing follow-up Troubleshoot use of DME in the home  Post discharge activity limitations prescribed by provider reviewed Reviewed Signs and symptoms of infection  AFIB Interventions: (Status:  Goal Met. and Transferred to CCM  ) Short Term Goal   Reviewed importance of adherence to anticoagulant exactly as prescribed Advised patient to discuss respiratory symptoms, need for additional equipment and supplies  with provider Counseled on bleeding risk associated with Coumadin  and importance of self-monitoring for signs/symptoms of bleeding Counseled on importance of regular laboratory monitoring as prescribed Counseled on seeking medical attention after a head injury or if there is  blood in the urine/stool Screening for signs and symptoms of depression related to chronic disease state  Assessed social determinant of health barriers  Patient Goals/Self-Care Activities: Participate in Transition of Care Program/Attend TOC scheduled calls Take all medications as prescribed Attend all scheduled provider appointments Call pharmacy for medication refills 3-7 days in advance of running out of medications Perform all self care activities independently  Perform IADL's (shopping, preparing meals, housekeeping, managing finances) independently Call provider office for new concerns or questions  Work with the social worker to address care coordination needs and will continue to work with the clinical team to address health care and disease management related needs  Follow Up Plan:  The patient has been provided with contact information for the care management team and has been advised to call with any health related questions or concerns.  CCM enrollment status - referral made to  [facility] CCM .          Medication review  Reviewed current home medications -- provided education as needed. Patient is aware of potential side effects and was encouraged to notify PCP for any adverse side effects or unwanted symptoms not relieved with interventions  Patient will call 911 for Medical Emergencies or Life -Threatening Symptoms.  Reviewed goals for care Patient/ Caregiver verbalizes understanding of instructions with the plan of care . The  Patient / Caregiver was encouraged to make informed decisions about care, actively participate in managing health conditions, and implement lifestyle changes as needed to promote independence and self-management of healthcare. SDOH screenings have been completed and addressed if indicted.  There are no reported barriers to care.    Follow-up Plan Plan:  Med changes 09/26/23 INR 5.1- Will cut down to 0.5/1mg  alternating and recheck in 5 days.       VBCI SW call 3/27   Ambulatory Referral  for identified CCM needs. Please see additional details in the order section of this note  Patient / Caregiver was made aware    The Complex Care Management services include support from the care team which includes  a  Nurse Coordinator, Clinical Social Worker, ( current) and /  or Pharmacist. ( If needed  )To assist wit medication needs and concerns    The Complex Care Management team's purpose is to assist in removing   barriers to your health care and to assist  you  in achieving  the goals  that are most important to your wellbeing   Complex Care Management services are voluntary, and the patient / caregiver may decline or stop services at any time by request to their care team member.   Consent to share information   The Patient  / Caregiver is aware and gave permission for VBCI CM  to share personal information  including TOC engagement notes with other service providers in connection with care, including accessing and sharing medical, and if applicable, mental health records. The Patient / Caregiver also agreed to a referral ( if indicated) being requested in order to support their identified needs.       The patient has successfully completed the 30-day TOC Program. Condition is stable No further acute needs identified at this time. Chronic conditions and ongoing care is  managed thru collaboration with  PCP,  Specialists and additional Healthcare Providers if indicated . Patient verbalized understanding of ongoing plan of care.  SDOH needs have been screened and interventions provided if identified.    Reviewed current home medications -- provided education as needed.  Previously discussed rationale of use, how/when to take medications. Patient is aware of potential side effects, and was encouraged to notify PCP for any changes in condition or signs / symptoms not relieved  with interventions.   Patient will call 911 for Medical Emergencies or  Life -Threatening or report to a local emergency department or urgent care.   Patient was encouraged to Contact PCP  with any questions or concerns regarding ongoing  medical care, any  difficulty obtaining or picking up  prescriptions, any  changes or  worsening in  condition including signs / symptoms not relieved  with interventions Follow up as indicated with the  Care Team , or sooner should any new problems arise.    Patient had no additional questions or concerns at this time. Current needs addressed.       [facility]  Please call the care guide team at [phone]  if you need to cancel or reschedule your appointment . For scheduled calls -Three attempts will be made to reach you -if the scheduled call is missed or  we are unable to reach the you after 3 attempts no additional outreach attempts will be made and the TOC follow-up will be closed .   If you need to speak to a Nurse you may  call me directly at the number below  or if I am unavailable,and  your need is urgent  please call the main [facility] number at [phone] and ask to speak with one of the [facility] ( Transition of Care )  Nurses  .  Patient was encouraged to Contact PCP with any changes in baseline or  medication regimen,  changes in health status  /  well-being, safety concerns, including falls any questions or concerns regarding ongoing medical care, any difficulty obtaining or picking up prescriptions, any changes or worsening in condition- including  symptoms not relieved  with interventions                                                                            Additionally, If you experience worsening of your symptoms, develop shortness of breath, If you are experiencing a medical emergency,  develop suicidal or homicidal thoughts you must seek medical attention immediately by calling 911 or report to your local emergency department or urgent care.   If you have a non-emergency medical problem during routine  business hours, please contact your provider's office and ask to speak with a nurse.       Please take the time to read instructions/literature along with the possible adverse reactions/side effects for all the Medicines that have been prescribed to you. Only take newly prescribed  Medications after you have completely understood and accept all the possible adverse reactions/side effects.   Do not take more than prescribed Medications for  Pain, Sleep and Anxiety. Do not drive when taking Pain medications or sleep aid/ insomnia  medications It is not advisable to combine anxiety, sleep and pain medications without talking with your primary care practitioner    If you are experiencing a Mental Health or Behavioral Health Crisis or need someone to talk to Please call the Suicide and Crisis Lifeline: 988 You may also call the [place] National Suicide Prevention Lifeline: (713)216-7282 or TTY: 252-555-9374 TTY 510-538-6829) to talk to a trained counselor.  You may call the Behavioral Health Crisis Line at 713-363-9239, at any time, 24 hours a day, 7 days a week- however If you are in danger or need immediate medical attention, call 911.   If you would like help to quit smoking, call 1-800-QUIT-NOW ( 934-848-8309) OR Espaol: 1-855-Djelo-Ya (1-093-235-5732) o para ms informacin haga clic aqu or Text READY to 202-542 to register via text.   Susa Loffler , BSN, RN [place]   [facility]-Population Health RN Care Manager Direct Dial [phone]  Website: [email or web address]

## 2023-09-27 ENCOUNTER — Encounter: Payer: Self-pay | Admitting: Cardiology

## 2023-09-27 ENCOUNTER — Ambulatory Visit (HOSPITAL_BASED_OUTPATIENT_CLINIC_OR_DEPARTMENT_OTHER): Payer: 59 | Admitting: Cardiology

## 2023-09-27 ENCOUNTER — Telehealth: Payer: Self-pay | Admitting: Family Medicine

## 2023-09-27 ENCOUNTER — Ambulatory Visit
Admission: RE | Admit: 2023-09-27 | Discharge: 2023-09-27 | Disposition: A | Discharge status: Home / Self Care | Payer: 59 | Source: Ambulatory Visit | Attending: Cardiology | Admitting: Cardiology

## 2023-09-27 VITALS — BP 134/89 | HR 66 | Wt 257.5 lb

## 2023-09-27 DIAGNOSIS — I5022 Chronic systolic (congestive) heart failure: Secondary | ICD-10-CM

## 2023-09-27 DIAGNOSIS — I2510 Atherosclerotic heart disease of native coronary artery without angina pectoris: Secondary | ICD-10-CM | POA: Diagnosis not present

## 2023-09-27 DIAGNOSIS — B370 Candidal stomatitis: Secondary | ICD-10-CM | POA: Insufficient documentation

## 2023-09-27 DIAGNOSIS — Z7984 Long term (current) use of oral hypoglycemic drugs: Secondary | ICD-10-CM | POA: Insufficient documentation

## 2023-09-27 DIAGNOSIS — Z8673 Personal history of transient ischemic attack (TIA), and cerebral infarction without residual deficits: Secondary | ICD-10-CM | POA: Diagnosis not present

## 2023-09-27 DIAGNOSIS — Z7982 Long term (current) use of aspirin: Secondary | ICD-10-CM | POA: Diagnosis not present

## 2023-09-27 DIAGNOSIS — Q2381 Bicuspid aortic valve: Secondary | ICD-10-CM | POA: Insufficient documentation

## 2023-09-27 DIAGNOSIS — I130 Hypertensive heart and chronic kidney disease with heart failure and stage 1 through stage 4 chronic kidney disease, or unspecified chronic kidney disease: Secondary | ICD-10-CM | POA: Diagnosis not present

## 2023-09-27 DIAGNOSIS — E669 Obesity, unspecified: Secondary | ICD-10-CM | POA: Diagnosis not present

## 2023-09-27 DIAGNOSIS — G4733 Obstructive sleep apnea (adult) (pediatric): Secondary | ICD-10-CM | POA: Diagnosis not present

## 2023-09-27 DIAGNOSIS — E7800 Pure hypercholesterolemia, unspecified: Secondary | ICD-10-CM | POA: Insufficient documentation

## 2023-09-27 DIAGNOSIS — I7121 Aneurysm of the ascending aorta, without rupture: Secondary | ICD-10-CM | POA: Diagnosis not present

## 2023-09-27 DIAGNOSIS — N183 Chronic kidney disease, stage 3 unspecified: Secondary | ICD-10-CM | POA: Diagnosis not present

## 2023-09-27 DIAGNOSIS — I255 Ischemic cardiomyopathy: Secondary | ICD-10-CM | POA: Diagnosis not present

## 2023-09-27 DIAGNOSIS — I472 Ventricular tachycardia, unspecified: Secondary | ICD-10-CM | POA: Diagnosis not present

## 2023-09-27 DIAGNOSIS — Z79899 Other long term (current) drug therapy: Secondary | ICD-10-CM | POA: Insufficient documentation

## 2023-09-27 DIAGNOSIS — Z8679 Personal history of other diseases of the circulatory system: Secondary | ICD-10-CM | POA: Diagnosis not present

## 2023-09-27 DIAGNOSIS — J449 Chronic obstructive pulmonary disease, unspecified: Secondary | ICD-10-CM | POA: Diagnosis not present

## 2023-09-27 DIAGNOSIS — N1831 Chronic kidney disease, stage 3a: Secondary | ICD-10-CM

## 2023-09-27 DIAGNOSIS — I480 Paroxysmal atrial fibrillation: Secondary | ICD-10-CM

## 2023-09-27 DIAGNOSIS — Z952 Presence of prosthetic heart valve: Secondary | ICD-10-CM

## 2023-09-27 DIAGNOSIS — Z7901 Long term (current) use of anticoagulants: Secondary | ICD-10-CM | POA: Insufficient documentation

## 2023-09-27 DIAGNOSIS — I4819 Other persistent atrial fibrillation: Secondary | ICD-10-CM | POA: Insufficient documentation

## 2023-09-27 MED ORDER — LOSARTAN POTASSIUM 25 MG PO TABS
25.0000 mg | ORAL_TABLET | Freq: Every day | ORAL | 3 refills | Status: DC
Start: 2023-09-27 — End: 2024-02-08

## 2023-09-27 NOTE — Progress Notes (Signed)
 ADVANCED HEART FAILURE CLINIC NOTE  Referring Physician: [name], DO  Primary Care: [name], DO Primary Cardiologist:  CC: Systolic heart failure, Stage C/D  HPI: Roberto [name] is a 68 y.o. male with aortic stenosis status post mechanical AVR, large thoracic aortic aneurysm followed by cardiac surgery, chronic systolic heart failure and nonobstructive coronary artery disease presenting today to establish care.    His cardiac history dates back to March 25, 2010 when he underwent mechanical aortic valve replacement; has been on warfarin since that time.  Echocardiogram in 2020 with reduction in LVEF to 40 to 45% and dilation of the ascending aorta measuring 4.6 cm.  In 2022 he underwent left and right heart catheterization which demonstrated 60% stenosis of a large diagonal branch but otherwise only mild disease of the mid LAD and proximal RCA.  In 2020 3 repeat echo with reduction in EF to 35 to 40%.   Presented to [facility] in 12/24.  Initial EKG was consistent with monomorphic ventricular tachycardia that resolved with IV amiodarone with repeat EKG demonstrating atrial fibrillation with PVCs.  LHC with stable nonobstructive CAD; RHC w/ cardiac index severely reduced at 1.9 L/min/m2 with severely elevated filling pressures. He was briefly placed on inotropes and aggressively diuresed.   Interval hx:  - Admitted in 2/25 with atrial fibrillation and some episodes of VT requiring ATP. He was cardioverted and reports feeling great since then.  - Currently taking losartan 12.5mg  daily  Activity level/exercise tolerance:  NYHA III Orthopnea:  Sleeps on 2 pillows Paroxysmal noctural dyspnea:  no Chest pain/pressure:  no Orthostatic lightheadedness:  mild Palpitations:  no Lower extremity edema:  no Presyncope/syncope:  no Cough:  no  Past Medical History:  Diagnosis Date   Aneurysm (HCC)    Bicuspid aortic valve    a. s/p #27 Carbomedics mechanical valve on  03/25/2010; b. on Coumadin; c. TTE 12/17: EF 40-45%, moderately dilated LV with moderate LVH, AVR well-seated with 14 mmHg gradient, peak AV velocity 2.5 m/s, mild mitral valve thickening with mild MR, mildly dilated RV with mildly reduced contraction   Cellulitis    CHF (congestive heart failure) (HCC)    Chronic kidney disease    Chronic systolic CHF (congestive heart failure) (HCC)    a. R/LHC 03/2010 showed no significant CAD, LVEDP 31 mmHg, mean AoV gradient 34 mmHg at rest and 47 mmHg with dobutamine 20 mcg/kg/min, AVA 1.0 cm^2, RA 31, RV 68/25, PA 68/47, PCWP 38. PA sat 65%. CO 6.2 L/min (Fick) and 5.3 L/min (thermodilution)   Clotting disorder (HCC)    COPD (chronic obstructive pulmonary disease) (HCC)    H/O mechanical aortic valve replacement 03/25/2010   a. #27 Carbomedics mechanical valve   Hearing loss    High cholesterol    HTN (hypertension)    Hypercholesterolemia    Renal infarct [facility]) 2017   Multiple right renal infarcts, likely embolic.   Stage 3 chronic kidney disease (HCC)    Stroke (HCC)    TIA (transient ischemic attack) 05/2014    Current Outpatient Medications  Medication Sig Dispense Refill   acetaminophen (TYLENOL) 500 MG tablet Take 1,000 mg by mouth every 6 (six) hours as needed for moderate pain (pain score 4-6).     albuterol (PROVENTIL) (2.5 MG/3ML) 0.083% nebulizer solution USE 1 VIAL IN NEBULIZER EVERY 6 HOURS AS NEEDED FOR WHEEZING FOR SHORTNESS OF BREATH 180 mL 1   allopurinol (ZYLOPRIM) 300 MG tablet Take 1 tablet (300 mg total) by mouth  daily. 90 tablet 1   amiodarone (PACERONE) 200 MG tablet Take 2 tablets (400 mg total) by mouth 2 (two) times daily for 5 days, THEN 1 tablet (200 mg total) 2 (two) times daily for 20 days. (Patient taking differently: Take 1 tablets (200 mg total) by mouth 2 (two) times daily  until  09-30-23, THEN 1 tablet (200 mg total)  daily ) 60 tablet 0   aspirin 81 MG chewable tablet Chew 81 mg by mouth in the morning.      buPROPion (WELLBUTRIN XL) 300 MG 24 hr tablet Take 300 mg by mouth daily.     dapagliflozin propanediol (FARXIGA) 10 MG TABS tablet Take 1 tablet (10 mg total) by mouth daily before breakfast. 30 tablet 11   fenofibrate (TRICOR) 48 MG tablet Take 1 tablet (48 mg total) by mouth daily. 90 tablet 3   losartan (COZAAR) 25 MG tablet Take 0.5 tablets (12.5 mg total) by mouth at bedtime. 45 tablet 3   metFORMIN (GLUCOPHAGE-XR) 500 MG 24 hr tablet Take 1 tablet (500 mg total) by mouth daily with breakfast. 90 tablet 1   nortriptyline (PAMELOR) 10 MG capsule Take 1 capsule (10 mg total) by mouth at bedtime. 90 capsule 1   nystatin (MYCOSTATIN) 100000 UNIT/ML suspension 5mL swish and spit TID 60 mL 1   nystatin (MYCOSTATIN/NYSTOP) powder Apply 1 Application topically 3 (three) times daily. 15 g 3   Omega-3 Fatty Acids (OMEGA 3 PO) Take 1,500 mg by mouth 2 (two) times daily.     OZEMPIC, 1 MG/DOSE, 4 MG/3ML SOPN Inject 1 mg into the skin once a week. On Mondays     potassium chloride SA (KLOR-CON M) 20 MEQ tablet Take 20 mEq by mouth at bedtime.     rosuvastatin (CRESTOR) 40 MG tablet Take 1 tablet (40 mg total) by mouth daily. 90 tablet 3   Spacer/Aero-Holding Chambers (AEROCHAMBER MV) inhaler Use as instructed 1 each 1   Spacer/Aero-Holding Chambers DEVI Use as directed Dx: COPD, J44.9 1 each 1   spironolactone (ALDACTONE) 25 MG tablet Take 0.5 tablets (12.5 mg total) by mouth daily. 45 tablet 3   torsemide (DEMADEX) 20 MG tablet Take 40 mg by mouth in the morning.     TRELEGY ELLIPTA 100-62.5-25 MCG/ACT AEPB Inhale 1 puff by mouth once daily 180 each 1   VENTOLIN HFA 108 (90 Base) MCG/ACT inhaler INHALE 2 PUFFS BY MOUTH EVERY 6 HOURS AS NEEDED FOR WHEEZING OR SHORTNESS OF BREATH 54 g 6   warfarin (COUMADIN) 1 MG tablet Take 0.5 tablets (0.5 mg total) by mouth daily. 30 tablet 2   warfarin (COUMADIN) 3 MG tablet Take 1 tablet (3 mg total) by mouth daily. 30 tablet 3   No current facility-administered  medications for this visit.    PHYSICAL EXAM: Vitals:   09/27/23 0959  BP: 134/89  Pulse: 66  SpO2: 100%   GENERAL: NAD Lungs- CTA CARDIAC:  JVP: 6 cm          Normal rate with regular rhythm. No murmur.  Pulses 2+. No edema.  ABDOMEN: Soft, non-tender, non-distended.  EXTREMITIES: Warm and well perfused.  NEUROLOGIC: No obvious FND    DATA REVIEW  ECG: 07/23/22: NSR w/ LBBB  As per my personal interpretation  ECHO: 06/20/23: LVEF 20-25%, moderately reduced RV function.   CATH: 06/20/23: Mild to moderate, non-obstructive coronary artery disease with 60% D1 stenosis and 20% mid LAD and proximal RCA lesions.  No significant change noted from prior angiogram  in 2020. Moderately elevated left heart filling pressure (PCWP 30 mmHg with prominent v-waves). Severely elevated right heart filling pressure (RA 25, RVEDP 18 mmHg). Moderate pulmonary hypertension (PA 50/28, mean 35 mmHg). Moderately reduced Fick cardiac output/index (CO 4.4 L/min, CI 1.9 L/min/m^2).  CMR:  06/25/23:  1. Severe LVE with global hypokinesis LVEF 20%  2. Delayed gadolinium images with mid myocardial basal septal uptake not consistent with CAD  3.  Mild RVE with hypokinesis RVEF 41%  5. Severe ascending thoracic aneurysm measuring 5.5 cm Patient does not appear to have had Bentall procedure during his AVR Note non contrast chest CT done 03/17/23 measured this at 5.9 cm He has been seen by Dr Laneta Simmers CVTS on 02/14/23 and not thought to be a redo candidate due to co morbidities 7. Bileaflet 27 mm AVR with carbomedics valve mild closing volume AR and normal leaflet motion  Sleep study: 2020 1. Moderate Obstructive Sleep Apnea (G47.33) with AHI 19.6/hr. 2. Mild Oxygen desaturations as low as 83%. Time spent with O2 sats < 88% was 0.3 min. 3. Severe Snoring. 4. Average heart rate 62bpm. 5. Normal sleep onset latency at 30 min. 6. Prolonged REM sleep onset latency at 302 min. 7. Fragmented sleep with 46  awakenings during sleep.  ASSESSMENT & PLAN:  Heart failure with reduced EF Etiology of UJ:[id number] cardiomyopathy with likely nonischemic component NYHA class / AHA Stage:NYHA IIB-III Volume status & Diuretics: Euvolemic, torsemide 20mg  daily Vasodilators:currently on losartan 12.5mg  nightly; will increase to 25mg  daily. Reivewed CMP from 09/11/23, sCr stable at 1.55.  Beta-Blocker: HR 66; will add bisoprolol once amio doses is decreased.  GNF:[id number] 12.5mg  daily Cardiometabolic:farxiga 10mg  Devices therapies & Valvulopathies:ICD, implanted 06/25/23 by Dr. Ladona Ridgel; reviewed device interrogation. Advanced therapies:not a candidate at this time.   Persistent Atrial fibrillation  - Amiodarone 400mg  BID  - start bisoprolol at follow up.   Ventricular tachycardia - Currently taking 400mg  BID for atrial fibrillation  - Will wean down after cardioversion - Followed by Dr. Ladona Ridgel - Reviewed device interrogation from 09/25/23; [employer]. Jude ICD. No shocks; 1% RV pacing. No arrhythmias detected.   CAD - Stable non-obstructive disease - continue ASA 81mg  daily and crestor.  - Reviewed Dr. Serita Kyle notes from 07/12/23 - No chest pain  Thoracic aortic aneurysm - Large ascending aortic aneurysm; followed by Dr. Laneta Simmers.  - This would complicate any attempts at advanced therapies.  - Seen by Dr. Laneta Simmers on 09/19/23; stable 5.4x5.6cm fusiform ascending aortic aneurysm. At this time due to comorbid conditions plan is to continue monitoring.   OSA - Sleep study reviewed from 2020, noted above and personally interpreted. He has an AHI of close to 20/hr; abnormal. - Does not use CPAP. Reports that his machine/mask is old. Will refer to Dr. Mayford Knife for evaluation.   Obesity - discussed importance of weight loss today  - Now on ozempic; tolerating it well.   Oral thrush - Currently on nystatin; holding off on fluconazole due to amiodarone & warfarin. Likely secondary to inhaler use.   I spent 35  minutes caring for this patient today including face to face time, ordering and reviewing labs, reviewing records/labs noted above, seeing the patient, documenting in the record, and arranging follow ups.   Roberto [name] Advanced Heart Failure Mechanical Circulatory Support

## 2023-09-27 NOTE — Patient Instructions (Signed)
 Medication Changes:  INCREASE YOUR LOSARTAN TO 25 MG ONCE DAILY   Follow-Up in: 6 WEEKS WITH DR. Gasper Lloyd.  At the [facility], you and your health needs are our priority. We have a designated team specialized in the treatment of Heart Failure. This Care Team includes your primary Heart Failure Specialized Cardiologist (physician), Advanced Practice Providers (APPs- Physician Assistants and Nurse Practitioners), and Pharmacist who all work together to provide you with the care you need, when you need it.   You may see any of the following providers on your designated Care Team at your next follow up:  Dr. Arvilla Meres Dr. Marca Ancona Dr. Dorthula Nettles Dr. Theresia Bough Clarisa Kindred, FNP Enos Fling, RPH-CPP  Please be sure to bring in all your medications bottles to every appointment.   Need to Contact us:  If you have any questions or concerns before your next appointment please send [place] a message through [place] or call our office at [phone].    TO LEAVE A MESSAGE FOR THE NURSE SELECT OPTION 2, PLEASE LEAVE A MESSAGE INCLUDING: YOUR NAME DATE OF BIRTH CALL BACK NUMBER REASON FOR CALL**this is important as we prioritize the call backs  YOU WILL RECEIVE A CALL BACK THE SAME DAY AS LONG AS YOU CALL BEFORE 4:00 PM

## 2023-09-27 NOTE — Telephone Encounter (Signed)
 Patient dropped off document  CAP FORM , to be filled out by provider. Patient requested to send it back via Call Patient to pick up within 5-days. Document is located in providers folder. Please advise at Mobile [phone] (mobile)

## 2023-09-27 NOTE — Addendum Note (Signed)
 Addended by: Jola Schmidt A on: 09/27/2023 11:28 AM   Modules accepted: Orders

## 2023-09-28 ENCOUNTER — Telehealth: Payer: Self-pay | Admitting: Family Medicine

## 2023-09-28 ENCOUNTER — Encounter: Admitting: *Deleted

## 2023-09-28 ENCOUNTER — Encounter: Payer: 59 | Admitting: Cardiology

## 2023-09-28 ENCOUNTER — Ambulatory Visit: Payer: Self-pay | Admitting: *Deleted

## 2023-09-28 NOTE — Telephone Encounter (Signed)
 Paperwork prefilled and placed in provider signature folder.

## 2023-09-28 NOTE — Patient Outreach (Signed)
 Care Coordination   Follow Up Visit Note   09/28/2023 Name: Roberto [name] MRN: [id number] DOB: 1956-03-16  Roberto [name] is a 68 y.o. year old male who sees Roberto Carrow, DO for primary care. I spoke with Roberto [name], patient's caregiver Roberto [name] by phone today.  What matters to the patients health and wellness today?  [name] report patient continues to improve, has not received call from [facility] yet, but does agree to follow up.  Denies any urgent concerns, encouraged to contact this care manager with questions.      Goals Addressed             This Visit's Progress    Management of chronic medical conditions       Interventions Today    Flowsheet Row Most Recent Value  Chronic Disease   Chronic disease during today's visit Atrial Fibrillation (AFib), Congestive Heart Failure (CHF), Other  [stable aortic aneurysm]  General Interventions   General Interventions Discussed/Reviewed General Interventions Reviewed, Doctor Visits, Communication with, [employer], Level of Care  [place], Roberto [name], interested in getting paid to care for patient, advised to discuss this with CSW next week.]  Doctor Visits Discussed/Reviewed Doctor Visits Reviewed, PCP, Specialist  [upcoming with PCP on 3/31, CSW 4/4, cardiology 4/9, GI on 4/9, and neuro on 4/22]  PCP/Specialist Visits Compliance with follow-up visit  Communication with --  [Call placed to [facility] to follow up on referral for home health services, notified that call was attempted to patient on 3/21.  Caregiver made aware, provided with contact information to call to schedule start of services]  Level of Care Personal Care Services  [Caregiver state she is having to help patient with ADLs, medication mangaement due to tremors, etc. Working on getting patient approved for CAP]  Exercise Interventions   Exercise Discussed/Reviewed Weight Managment  Weight Management Weight maintenance  [Report daily weights, state these have been  stable, no shortness of breath or swelling]  Education Interventions   Education Provided Provided Education, Provided Web-based Education  Provided Verbal Education On When to see the doctor, Medication  [Meds reviewed, no recent changes. Roberto [name] is helping with administration. Discussed daily BP/HR monitoring, state this isn't done daily but every other day and has been within range]              SDOH assessments and interventions completed:  No     Care Coordination Interventions:  Yes, provided   Follow up plan: Follow up call scheduled for 4/18 with Roberto [name], RNCM    Encounter Outcome:  Patient Visit Completed   Roberto Langton, RN, MSN, CCM [facility], [facility] RN Care Coordinator Direct Dial: [phone] / Main [phone] Fax [phone] Email: [email or web address] Website: [email or web address]

## 2023-09-28 NOTE — Telephone Encounter (Signed)
 Form prefilled and placed in providers signature folder.

## 2023-09-28 NOTE — Patient Instructions (Signed)
 Visit Information  Thank you for taking time to visit with me today. Please don't hesitate to contact me if I can be of assistance to you before our next scheduled telephone appointment.  Following are the goals we discussed today:  Monitor weights, BP, and HR daily.  Take medications as instructed.  Discuss CAP services and getting paid for caring for patient with SW next week.  Call [facility] at [phone] to start nursing and PT services.   Our next appointment is by telephone on 4/18  Please call the care guide team at [phone] if you need to cancel or reschedule your appointment.   Please call the Suicide and Crisis Lifeline: 988 call the [place] National Suicide Prevention Lifeline: 7160012669 or TTY: (737)408-4153 TTY 2036131561) to talk to a trained counselor call 1-800-273-TALK (toll free, 24 hour hotline) call 911 if you are experiencing a Mental Health or Behavioral Health Crisis or need someone to talk to.  Patient verbalizes understanding of instructions and care plan provided today and agrees to view in MyChart. Active MyChart status and patient understanding of how to access instructions and care plan via MyChart confirmed with patient.     The patient has been provided with contact information for the care management team and has been advised to call with any health related questions or concerns.   Rodney Langton, RN, MSN, CCM [facility], [facility] RN Care Coordinator Direct Dial: [phone] / Main [phone] Fax [phone] Email: [email or web address] Website: [email or web address]

## 2023-09-28 NOTE — Telephone Encounter (Signed)
 Patient dropped off document  Physician Verification Form for [employer] , to be filled out by provider. Patient requested to send it back via Call Patient to pick up within 5-days. Document is located in providers folder.Please advise at Mobile [phone] (mobile)

## 2023-09-30 ENCOUNTER — Encounter: Payer: Self-pay | Admitting: Internal Medicine

## 2023-10-01 ENCOUNTER — Ambulatory Visit: Admitting: Family Medicine

## 2023-10-01 ENCOUNTER — Other Ambulatory Visit: Payer: Self-pay

## 2023-10-01 ENCOUNTER — Encounter: Payer: Self-pay | Admitting: Internal Medicine

## 2023-10-01 MED ORDER — TORSEMIDE 20 MG PO TABS: 40.0000 mg | ORAL_TABLET | Freq: Every morning | ORAL | 0 refills | Status: DC

## 2023-10-02 LAB — ECHOCARDIOGRAM COMPLETE
AR max vel: 0.99 cm2
AV Area VTI: 1.15 cm2
AV Area mean vel: 1.01 cm2
AV Mean grad: 12.6 mmHg
AV Peak grad: 22.1 mmHg
Ao pk vel: 2.35 m/s
Area-P 1/2: 4.52 cm2
Calc EF: 24.5 %
MV VTI: 1.82 cm2
S' Lateral: 5.5 cm
Single Plane A2C EF: 26 %
Single Plane A4C EF: 30.8 %

## 2023-10-02 LAB — POCT INR: POC INR: 1.4

## 2023-10-03 LAB — POCT INR: POC INR: 1.4

## 2023-10-03 NOTE — Telephone Encounter (Signed)
 Paperwork completed. Copy placed in scan bin and original placed in bin for pick up. Called and notified Selena Batten that the forms were completed and ready to be picked up.

## 2023-10-04 ENCOUNTER — Telehealth: Payer: Self-pay | Admitting: Family Medicine

## 2023-10-04 LAB — POCT INR: POC INR: 1.7

## 2023-10-04 NOTE — Telephone Encounter (Signed)
 Form was completed, faxed, and hard copy picked up by Selena Batten.

## 2023-10-04 NOTE — Telephone Encounter (Signed)
Please schedule virtual visit to discuss.

## 2023-10-04 NOTE — Telephone Encounter (Signed)
 FYI: Patient's friend came by wanting to inform provider that: [name] INR was 2.1

## 2023-10-04 NOTE — Telephone Encounter (Signed)
 Appointment has been scheduled for 4/15 patient state he will keep that appt.

## 2023-10-05 ENCOUNTER — Ambulatory Visit: Payer: Self-pay | Admitting: *Deleted

## 2023-10-05 ENCOUNTER — Telehealth: Payer: Self-pay | Admitting: *Deleted

## 2023-10-05 LAB — POCT INR: POC INR: 2

## 2023-10-05 NOTE — Patient Outreach (Signed)
 Care Coordination   10/05/2023 Name: Roberto [name] MRN: [id number] DOB: 08/05/1955   Care Coordination Outreach Attempts:  An unsuccessful telephone outreach was attempted today to offer the patient information about available complex care management services.  Follow Up Plan:  Additional outreach attempts will be made to offer the patient complex care management information and services.   Encounter Outcome:  No Answer   Care Coordination Interventions:  No, not indicated    Bishoy Cupp, LCSW [place]  [facility], [facility] Licensed Clinical Social Worker Care Coordinator  Direct Dial: [phone]

## 2023-10-05 NOTE — Patient Outreach (Signed)
 Care Coordination   Initial Visit Note   10/05/2023 Name: Hudsyn Champine MRN: [id number] DOB: 08-Dec-1955  Jordany Russett is a 68 y.o. year old male who sees [name], DO for primary care. I spoke with  [name] and caregiver Selena Batten by phone today.  What matters to the patients health and wellness today?  Patient and caregiver requesting resources to assist with utility bills and in home care support.    Goals Addressed             This Visit's Progress    Community resource needs        Keep all upcoming appointment discussed today Continue with compliance of taking medication prescribed by Doctor Follow up with the [facility] regarding eligibility for the Aid and Attendance program  Continue to follow up with the [facility] regarding paid caregiver services Follow up with the [employer] and  Department of Social Services regarding assistance with [employer] to schedule start of care for home health services         SDOH assessments and interventions completed:  Yes  SDOH Interventions Today    Flowsheet Row Most Recent Value  SDOH Interventions   Food Insecurity Interventions Intervention Not Indicated  Housing Interventions Intervention Not Indicated  Utilities Interventions Community Resources Provided  [encouraged follow up with [place] representative and DSS-however reports no funding]        Care Coordination Interventions:  Yes, provided  Interventions Today    Flowsheet Row Most Recent Value  Chronic Disease   Chronic disease during today's visit Atrial Fibrillation (AFib), Congestive Heart Failure (CHF)  General Interventions   General Interventions Discussed/Reviewed General Interventions Discussed, Science writer assessed for [employer] needs. Discussed plan to follow up with with the [facility] and/or DSS to request assistance with utilities.]  Doctor Visits Discussed/Reviewed Doctor Visits  Discussed, Specialist  [GI 4/9 PCP 5/13]  PCP/Specialist Visits Compliance with follow-up visit  Level of Care Personal Care Services  [patient confirms having a live in caregiver since 2007-pt has applied for CAP services and current caregiver has applied through [employer] to be patients paid caregiver-approval now pending for CAP services and paid caregiving]  Exercise Interventions   Exercise Discussed/Reviewed Exercise Discussed  [Patient referred to [facility] for PT, encouraged caregiver to follow up with scheduling the initial assessment for start of care]  Education Interventions   Education Provided Provided Education  Provided Verbal Education On [employer] for applying for Aid and Attendance through the [facility]-encouraged follow up with the [place] satellite office in [place]-caregiver/ptagreeable to presenting to the [place] to apply for services. Per caregiver, pt currently has a MD with the [facility]]  Mental Health Interventions   Mental Health Discussed/Reviewed Mental Health Discussed  [Patient assessed for MH needs-declined mental health follow up at this time]  Safety Interventions   Safety Discussed/Reviewed Home Safety, Safety Discussed  Home Safety Assistive Devices  [encouraged use of walker while ambulating]  Advanced Directive Interventions   Advanced Directives Discussed/Reviewed Advanced Directives Discussed  [per pt's caregiver, Selena Batten is patient's POA, Advanced Directive also completed]       Follow up plan: Follow up call scheduled for 10/24/23    Encounter Outcome:  Patient Visit Completed

## 2023-10-05 NOTE — Patient Instructions (Signed)
 Visit Information  Thank you for taking time to visit with me today. Please don't hesitate to contact me if I can be of assistance to you.   Following are the goals we discussed today:   Goals Addressed             This Visit's Progress    Community resource needs        Keep all upcoming appointment discussed today Continue with compliance of taking medication prescribed by Doctor Follow up with the [facility] regarding eligibility for the Aid and Attendance program  Continue to follow up with the [employer] regarding paid caregiver services Follow up with the [employer] and  Department of Social Services regarding assistance with [employer] to schedule start of care for home health services         Our next appointment is by telephone on 10/24/23 at 11am  Please call the care guide team at [phone] if you need to cancel or reschedule your appointment.   If you are experiencing a Mental Health or Behavioral Health Crisis or need someone to talk to, please call 911   Patient verbalizes understanding of instructions and care plan provided today and agrees to view in MyChart. Active MyChart status and patient understanding of how to access instructions and care plan via MyChart confirmed with patient.     Telephone follow up appointment with care management team member scheduled for: 10/24/23  Verna Czech, LCSW [place]  [facility], [facility] Licensed Clinical Social Worker Care Coordinator  Direct Dial: [phone]

## 2023-10-06 LAB — POCT INR: POC INR: 2.5

## 2023-10-09 ENCOUNTER — Telehealth: Payer: Self-pay

## 2023-10-09 LAB — POCT INR: POC INR: 2.2

## 2023-10-09 NOTE — Progress Notes (Unsigned)
 [facility] Initial Consultation   Referring Provider [name], DO [address] [place],  [place] [address]  Primary Care Provider [name], DO  Patient Profile: Roberto [name] is a 68 y.o. male who is seen in consultation in the [facility] at the request of Dr. Laural Benes for evaluation and management of the problem(s) noted below.  Problem List: Positive Cologuard Family history of colon or rectal cancer in 2 first-degree relatives-mother and father Elevated AST/ALT   History of Present Illness   [name] is a 68 y.o. male with a history of HTN, NICM last EF 25-30% 09/2023, aortic aneurysm, T2DM, CAD, valvular heart disease s/p AVR on coumadin, atrial fibrillations/p cardioversion 08/28/2023, V. Tach s/p AICD/pacemaker, HLD, TIA 2016, CKD 3, COPD, who is referred to the gastroenterology office for colorectal cancer screening in the setting of positive Cologuard 05/2023 as well as family history of colorectal cancer in 2 first-degree relatives-mother and father.  Mr. Disorder below reports the following history: -- No prior colonoscopy -- States that he attempted to have a colonoscopy in the past but did not receive correct instructions regarding his Coumadin -- Cologuard + 05/2023 -- No blood or mucus in stool -- Denies change in bowel habits or constipation -- No abdominal pain or cramping  -- Mother diagnosed with CRC greater than 15 years of age -- Father diagnosed with CRC greater than 68 years of age -- No family history of polyps  --Has significant cardiac comorbidities: HTN, NICM last EF 25-30% 09/2023, aortic aneurysm, CAD, valvular heart disease s/p AVR on coumadin, atrial fibrillations/p cardioversion 08/28/2023, V. Tach s/p AICD/pacemaker, HLD, CVA,   -- Reports he is feeling substantially better status post cardioversion to 2025 -- Denies SOB, DOE, CP, LE edema -- States he is on stable doses of furosemide and torsemide -- Can walk  more than 100 feet without difficulty -- On Coumadin-denies bleeding problems on anticoagulation  -- Chart indicates a diagnosis of COPD-no supplemental oxygen  -- Carries a diagnosis of OSA-does not wear CPAP because it does not fit well  -- Reports a remote TIA in 2016  -- Review of her prior labs demonstrates elevated AST and ALT 40-70 which appears to be longstanding -- No liver imaging in recent past  GI Review of Symptoms Significant for None. Otherwise negative.  General Review of Systems  Review of systems is significant for the pertinent positives and negatives as listed per the HPI.  Full ROS is otherwise negative.  Past Medical History   Past Medical History:  Diagnosis Date   Aneurysm (HCC)    Bicuspid aortic valve    a. s/p #27 [employer] mechanical valve on 03/25/2010; b. on Coumadin; c. TTE 12/17: EF 40-45%, moderately dilated LV with moderate LVH, AVR well-seated with 14 mmHg gradient, peak AV velocity 2.5 m/s, mild mitral valve thickening with mild MR, mildly dilated RV with mildly reduced contraction   Cellulitis    CHF (congestive heart failure) (HCC)    Chronic kidney disease    Chronic systolic CHF (congestive heart failure) (HCC)    a. R/LHC 03/2010 showed no significant CAD, LVEDP 31 mmHg, mean AoV gradient 34 mmHg at rest and 47 mmHg with dobutamine 20 mcg/kg/min, AVA 1.0 cm^2, RA 31, RV 68/25, PA 68/47, PCWP 38. PA sat 65%. CO 6.2 L/min (Fick) and 5.3 L/min (thermodilution)   Clotting disorder (HCC)    COPD (chronic obstructive pulmonary disease) (HCC)    H/O mechanical aortic valve replacement 03/25/2010  a. #27 [employer]omed[employer] mechanical valve   Hearing loss    High cholesterol    HTN (hypertension)    Hypercholesterolemia    Renal infarct [facility]) 2017   Multiple right renal infarcts, likely embolic.   Stage 3 chronic kidney disease (HCC)    Stroke [facility])    TIA (transient ischemic attack) 05/2014     Past Surgical History   Past Surgical History:   Procedure Laterality Date   AORTIC VALVE REPLACEMENT     AORTIC VALVE SURGERY     CARDIAC CATHETERIZATION  03/21/2010   No significant CAD. Severe aortic stenosis. Severely elevated left and right heart filling pressures.   CARDIAC SURGERY  2009   CHF   CARDIOVERSION N/A 08/28/2023   Procedure: CARDIOVERSION;  Surgeon: Jodelle Red, MD;  Location: [facility] INVASIVE CV LAB;  Service: Cardiovascular;  Laterality: N/A;   CARPAL TUNNEL RELEASE Left 2005   ICD IMPLANT N/A 06/25/2023   Procedure: ICD IMPLANT;  Surgeon: Marinus Maw, MD;  Location: [facility] INVASIVE CV LAB;  Service: Cardiovascular;  Laterality: N/A;   PACEMAKER IMPLANT     RIGHT HEART CATH AND CORONARY ANGIOGRAPHY N/A 01/28/2019   Procedure: RIGHT HEART CATH AND CORONARY ANGIOGRAPHY;  Surgeon: Yvonne Kendall, MD;  Location: [facility] INVASIVE CV LAB;  Service: Cardiovascular;  Laterality: N/A;   RIGHT HEART CATH AND CORONARY ANGIOGRAPHY N/A 06/20/2023   Procedure: RIGHT HEART CATH AND CORONARY ANGIOGRAPHY;  Surgeon: Yvonne Kendall, MD;  Location: [facility] INVASIVE CV LAB;  Service: Cardiovascular;  Laterality: N/A;   TONSILLECTOMY  1962     Allergies and Medications   Allergies  Allergen Reactions   Nicotine     Patches caused localized rash and hives     No outpatient medications have been marked as taking for the 10/10/23 encounter (Office Visit) with Ottie Glazier, MD.     Family History   Family History  Problem Relation Age of Onset   Arthritis Mother    Dementia Mother    Colon cancer Mother    Arthritis Father    Diabetes Father    Stroke Father    Colon cancer Father    Heart attack Brother    Breast cancer Sister    Seizures Sister    Cancer Brother        brain   Heart disease Brother    Heart attack Brother      Social History   Social History   Tobacco Use   Smoking status: Every Day    Current packs/day: 0.20    Average packs/day: 0.5 packs/day for 47.8 years (23.7 ttl pk-yrs)    Types:  Cigarettes    Start date: 1974    Last attempt to quit: 2021   Smokeless tobacco: Never   Tobacco comments:    3 or less cigarettes per day  Vaping Use   Vaping status: Never Used  Substance Use Topics   Alcohol use: Never   Drug use: Never   Evander reports that he has been smoking cigarettes. He started smoking about 51 years ago. He has a 23.7 pack-year smoking history. He has never used smokeless tobacco. He reports that he does not drink alcohol and does not use drugs.  Vital Signs and Physical Examination   Vitals:   10/10/23 1415  BP: 116/68  Pulse: 67  SpO2: 98%   Body mass index is 41.64 kg/m. Weight: 258 lb (117 kg)  General: Obese, chronically ill-appearing gentleman in no distress Head: Normocephalic and atraumatic  Eyes: Sclerae anicteric, EOMI Lungs: Clear throughout to auscultation Heart: Regular rate and rhythm; No murmurs, rubs or bruits Abdomen: Soft, non tender and non distended. No masses, hepatosplenomegaly or hernias noted. Normal Bowel sounds Rectal: Deferred Extremities: No edema or deformities noted   Review of Data  The following data was reviewed at the time of this encounter:  Laboratory Studies      Latest Ref Rng & Units 09/11/2023    1:52 PM 08/30/2023    4:29 AM 08/29/2023    4:54 AM  CBC  WBC 3.4 - 10.8 x10E3/uL 12.3  8.9  10.0   Hemoglobin 13.0 - 17.7 g/dL 69.6  29.5  28.4   Hematocrit 37.5 - 51.0 % 51.2  41.3  40.6   Platelets 150 - 450 x10E3/uL 412  233  223     Lab Results  Component Value Date   LIPASE 28 05/01/2016      Latest Ref Rng & Units 09/11/2023    1:52 PM 09/10/2023   11:03 AM 08/30/2023    4:29 AM  CMP  Glucose 70 - 99 mg/dL 84  132  95   BUN 8 - 27 mg/dL 31  29  25    Creatinine 0.76 - 1.27 mg/dL 4.40  1.02  7.25   Sodium 134 - 144 mmol/L 140  142  138   Potassium 3.5 - 5.2 mmol/L 4.1  3.9  3.4   Chloride 96 - 106 mmol/L 97  102  98   CO2 20 - 29 mmol/L 27  28  24    Calcium 8.6 - 10.2 mg/dL 9.8  9.2  9.2    Total Protein 6.0 - 8.5 g/dL 7.2  8.1    Total Bilirubin 0.0 - 1.2 mg/dL 0.6  0.9    Alkaline Phos 44 - 121 IU/L 93  68    AST 0 - 40 IU/L 63  71    ALT 0 - 44 IU/L 75  77     Cologuard + 05/2023  09/14/23   INR 8.4  Imaging Studies  ECHO 09/27/2023   1. Left ventricular ejection fraction, by estimation, is 25 to 30%. Left  ventricular ejection fraction by 3D volume is 28 %. The left ventricle has  severely decreased function. The left ventricle demonstrates global  hypokinesis. The left ventricular  internal cavity size was severely dilated. Left ventricular diastolic  parameters are consistent with Grade II diastolic dysfunction  (pseudonormalization). The average left ventricular global longitudinal  strain is -10.5 %. The global longitudinal strain  is abnormal.   2. Right ventricular systolic function is mildly reduced. The right  ventricular size is normal.   3. Left atrial size was mild to moderately dilated.   4. Right atrial size was moderately dilated.   5. The mitral valve is degenerative. Mild to moderate mitral valve  regurgitation. No evidence of mitral stenosis.   6. Tricuspid valve regurgitation is mild to moderate.   7. The aortic valve has been repaired/replaced. Aortic valve  regurgitation versus perivalvular leak is trivial. Mild to moderate aortic  valve stenosis with at least some component of patient-prosthesis  mismatch. Aortic valve mean gradient measures 12.6  mmHg.   8. There is mild dilatation of the aortic root, measuring 43 mm. Known  severe dilation of the ascending aorta is not well-visualized on this  examination.   GI Procedures and Studies  None   Clinical Impression  It is my clinical impression that [name] is a 68 y.o.  male with a history of HTN, NICM last EF 25-30% 09/2023, aortic aneurysm, T2DM, CAD, valvular heart disease s/p AVR on coumadin, atrial fibrillation s/p cardioversion 08/28/2023, V. Tach s/p AICD/pacemaker, HLD, TIA  2016, CKD 3, COPD who is referred for;  Positive Cologuard Family history of colorectal cancer in 2 first-degree relatives-mother and father Elevated liver enzymes  [name] is referred to the office to discuss performing a colonoscopy in the setting of a positive Cologuard test.  He has never had a colonoscopy before.  He reports a pertinent family history of colorectal cancer in both his mother and father.  He has a significant cardiac history including valvular heart disease status post AVR on Coumadin with recent supratherapeutic INRs.  He recently underwent cardioversion for atrial fibrillation and ventricular tachycardia.  He has an AICD and pacemaker.  He notes that he is feeling substantially better since this procedure in February 2025.  Denies SOB, DOE, LE edema or chest pain.  He has an ICM with most recent echocardiogram performed in March 2025 demonstrating an LVEF of 25 to 30%.  We spent the length of today's visit discussing primarily the risk of anesthesia in the setting of his significant cardiac history.  Discussed risks of anesthesia, aspiration, perforation and bleeding.  In light of his anticoagulant use and significant cardiac history we will coordinate with his cardiology team regarding holding his Coumadin and recommendations regarding a Lovenox bridge.  Chart review documents incidental notation of elevated liver enzymes AST/ALT dating back at least 1 year or more.  No recent liver imaging.  Given that the focus of today's visit was primarily related to his positive Cologuard we did not delve into this and significant detail and can review at a future visit.  Plan  Schedule colonoscopy at hospital-based procedure unit in light of significant medical comorbidities Coordinate with cardiology team holding Coumadin x 5 days prior to procedure and recommendations regarding Lovenox bridge Monitor hepatic function panel -at future visit can perform serologic evaluation for chronic  hepatitides We will recommend a liver ultrasound with Doppler flows given elevated liver function tests  Planned Follow Up TBD pending colonoscopy results  The patient or caregiver verbalized understanding of the material covered, with no barriers to understanding. All questions were answered. Patient or caregiver is agreeable with the plan outlined above.    It was a pleasure to see Trai.  If you have any questions or concerns regarding this evaluation, do not hesitate to contact me.  Maren Beach, MD [facility]

## 2023-10-09 NOTE — Patient Outreach (Signed)
 Care Coordination   Friend Called in   Visit Note   10/09/2023 Name: Roberto [name] MRN: [id number] DOB: 09-22-1955  Roberto [name] is a 68 y.o. year old male who sees [name], DO for primary care. I spoke with  [name]  and his friend Selena Batten by phone today.       Care Coordination Interventions:  Reviewed past and upcoming visits . He has not missed a scheduled call. Last call 4/4 was completed   Follow up plan: No further intervention required.   Susa Loffler , BSN, RN [facility]   VBCI-Population Health RN Care Manager Direct Dial [phone]  Fax: [phone] Website: [email or web address]

## 2023-10-10 ENCOUNTER — Ambulatory Visit: Payer: 59 | Admitting: Pediatrics

## 2023-10-10 ENCOUNTER — Telehealth: Payer: Self-pay

## 2023-10-10 ENCOUNTER — Encounter: Payer: Self-pay | Admitting: Internal Medicine

## 2023-10-10 ENCOUNTER — Ambulatory Visit: Payer: 59 | Attending: Internal Medicine | Admitting: Internal Medicine

## 2023-10-10 ENCOUNTER — Encounter: Payer: Self-pay | Admitting: Pediatrics

## 2023-10-10 VITALS — BP 116/68 | HR 67 | Ht 66.0 in | Wt 258.0 lb

## 2023-10-10 VITALS — BP 100/68 | HR 67 | Ht 66.0 in | Wt 258.5 lb

## 2023-10-10 DIAGNOSIS — E785 Hyperlipidemia, unspecified: Secondary | ICD-10-CM

## 2023-10-10 DIAGNOSIS — I472 Ventricular tachycardia, unspecified: Secondary | ICD-10-CM

## 2023-10-10 DIAGNOSIS — Z79899 Other long term (current) drug therapy: Secondary | ICD-10-CM

## 2023-10-10 DIAGNOSIS — Z952 Presence of prosthetic heart valve: Secondary | ICD-10-CM

## 2023-10-10 DIAGNOSIS — I5022 Chronic systolic (congestive) heart failure: Secondary | ICD-10-CM | POA: Diagnosis not present

## 2023-10-10 DIAGNOSIS — Z800 Family history of malignant neoplasm of digestive organs: Secondary | ICD-10-CM | POA: Diagnosis not present

## 2023-10-10 DIAGNOSIS — I428 Other cardiomyopathies: Secondary | ICD-10-CM | POA: Diagnosis not present

## 2023-10-10 DIAGNOSIS — I480 Paroxysmal atrial fibrillation: Secondary | ICD-10-CM | POA: Diagnosis not present

## 2023-10-10 DIAGNOSIS — Z1211 Encounter for screening for malignant neoplasm of colon: Secondary | ICD-10-CM

## 2023-10-10 DIAGNOSIS — R195 Other fecal abnormalities: Secondary | ICD-10-CM | POA: Insufficient documentation

## 2023-10-10 DIAGNOSIS — I2510 Atherosclerotic heart disease of native coronary artery without angina pectoris: Secondary | ICD-10-CM

## 2023-10-10 DIAGNOSIS — R748 Abnormal levels of other serum enzymes: Secondary | ICD-10-CM

## 2023-10-10 DIAGNOSIS — E1169 Type 2 diabetes mellitus with other specified complication: Secondary | ICD-10-CM

## 2023-10-10 DIAGNOSIS — I7121 Aneurysm of the ascending aorta, without rupture: Secondary | ICD-10-CM

## 2023-10-10 LAB — POCT INR: POC INR: 2

## 2023-10-10 MED ORDER — NA SULFATE-K SULFATE-MG SULF 17.5-3.13-1.6 GM/177ML PO SOLN: 1.0000 | Freq: Once | ORAL | 0 refills | Status: AC

## 2023-10-10 NOTE — Progress Notes (Signed)
 Cardiology Office Note:  .   Date:  10/10/2023  ID:  Roberto [name], DOB May 04, 1956, MRN [id number] PCP: Roberto Carrow, DO  [name] Providers Cardiologist:  Roberto Kendall, MD Electrophysiologist:  Roberto Bunting, MD  Sleep Medicine:  Roberto Magic, MD     History of Present Illness: .   Roberto [name] is a 68 y.o. male with history of mechanical aortic valve replacement, non-obstructive coronary artery disease, thoracic aortic aneurysm (followed by [facility]), stroke, right renal infarct, chronic systolic heart failure (LVEF 20% by cardiac MRI in 06/2023) complicated by VT s/p ICD, persistent atrial fibrillation, hyperlipidemia, obstructive sleep apnea not using CPAP, and morbid obesity, who presents for follow-up of HFrEF, thoracic aortic aneurysm, valvular heart disease, and coronary artery disease.  I last saw him in early January, which time he was feeling well.  We elected to add spironolactone for optimization of his heart failure regimen.  He was admitted in February with atrial fibrillation and episodes of VT requiring ATP.  He underwent cardioversion and has subsequently followed with the advanced heart failure team, having seen Dr. Gasper [name] most recently on 09/27/2023.  During his admission in February, he briefly required inotropic support and was aggressively diuresed.  Losartan was increased to 25 mg daily at that time.  Today, Roberto [name] reports that has been feeling fairly well.  He continues to be off balance at times but has not had any frank lightheadedness nor falls.  He had thrush about a month ago and is starting to recover from that.  His appetite was not good with thrush, though he notes that this is improving.  He implicates is improving appetite as the cause for his weight gain, not fluid retention.  He denies chest pain, shortness of breath, palpitations, edema, and orthopnea.  INR on 3/26 was supratherapeutic at 5.1.  He reports home INR this morning was down to  2.0.  He reports need for colonoscopy at some point in the near future given abnormal stool tests.  He is awaiting consultation with Roberto [name] in Roberto [name].  ROS: See HPI  Studies Reviewed: Marland Kitchen        TTE (09/27/2023):  1. Left ventricular ejection fraction, by estimation, is 25 to 30%. Left  ventricular ejection fraction by 3D volume is 28 %. The left ventricle has  severely decreased function. The left ventricle demonstrates global  hypokinesis. The left ventricular  internal cavity size was severely dilated. Left ventricular diastolic  parameters are consistent with Grade II diastolic dysfunction  (pseudonormalization). The average left ventricular global longitudinal  strain is -10.5 %. The global longitudinal strain  is abnormal.   2. Right ventricular systolic function is mildly reduced. The right  ventricular size is normal.   3. Left atrial size was mild to moderately dilated.   4. Right atrial size was moderately dilated.   5. The mitral valve is degenerative. Mild to moderate mitral valve  regurgitation. No evidence of mitral stenosis.   6. Tricuspid valve regurgitation is mild to moderate.   7. The aortic valve has been repaired/replaced. Aortic valve  regurgitation versus perivalvular leak is trivial. Mild to moderate aortic  valve stenosis with at least some component of patient-prosthesis  mismatch. Aortic valve mean gradient measures 12.6  mmHg.   8. There is mild dilatation of the aortic root, measuring 43 mm. Known  severe dilation of the ascending aorta is not well-visualized on this  examination.   Risk Assessment/Calculations:    CHA2DS2-VASc Score = 5  This indicates a 7.2% annual risk of stroke. The patient's score is based upon: CHF History: 1 HTN History: 0 Diabetes History: 0 Stroke History: 2 Vascular Disease History: 1 Age Score: 1 Gender Score: 0            Physical Exam:   VS:  BP 100/68 (BP Location: Left Arm, Patient Position: Sitting,  Cuff Size: Normal)   Pulse 67   Ht 5\' 6"  (1.676 m)   Wt 258 lb 8 oz (117.3 kg)   SpO2 97%   BMI 41.72 kg/m    Wt Readings from Last 3 Encounters:  10/10/23 258 lb 8 oz (117.3 kg)  09/27/23 257 lb 8 oz (116.8 kg)  09/26/23 256 lb 12.8 oz (116.5 kg)    General:  NAD. Neck: No JVD or HJR, though body habitus limits evaluation. Lungs: Clear to auscultation bilaterally without wheezes or crackles. Heart: Regular rate and rhythm without murmurs, rubs, or gallops.  Mechanical S2 noted. Abdomen: Soft, nontender, nondistended. Extremities: Trace pretibial edema bilaterally.  ASSESSMENT AND PLAN: .    Chronic HFrEF due to nonischemic cardiomyopathy: Volume status appears fairly stable though weight is up 1 pound since last visit with Dr. Gasper [name] on 09/27/2023 and 9 pounds since nadir during hospitalization last month.  We will continue his current diuretic regimen as well as GDMT with dapagliflozin 10 mg daily, losartan 25 mg daily, and spironolactone 12.5 mg daily.  I will defer adding back metoprolol today given his borderline low blood pressure.  If blood pressure trends up over the next month, prelaw could be considered at his next visit with Dr. Gasper [name] as previously recommended.  I will check a BMP today to ensure stable creatinine and renal function with recent escalation of losartan.  Paroxysmal atrial fibrillation: Roberto [name] is maintaining sinus rhythm, having de-escalated amiodarone to 200 mg daily last week.  We will plan to continue this indefinitely.  Continue warfarin for anticoagulation in the setting of mechanical aortic valve.  Thoracic aortic aneurysm and history of mechanical MVR: Severely dilated ascending aorta noted, currently continuing with surveillance with Roberto [name] given patient's comorbidities precluding repair at this time.  Continue blood pressure and lipid control and avoidance of fluoroquinolones.  Continue anticoagulation/antiplatelet therapy with ongoing INR  monitoring per Roberto [name].  Ventricular tachycardia: Continue amiodarone with ongoing management of VT and ICD per electrophysiology.  Coronary artery disease: No angina reported with prior catheterization showing nonobstructive CAD.  Continue secondary prevention with aspirin and rosuvastatin.  Hyperlipidemia associated with type 2 diabetes mellitus: Continue rosuvastatin and fenofibrate with lifestyle modifications as tolerated.  Ongoing DM management per Roberto [name].  Morbid obesity: BMI remains greater than 40.  As Roberto [name] seems to be stable from a heart standpoint, I encouraged him to begin resuming some mild activity and to continue working on dietary changes.      Dispo: Follow-up with heart failure next month as scheduled.  Return to see me in 6 months.  Signed, Roberto Kendall, MD

## 2023-10-10 NOTE — Telephone Encounter (Signed)
  Roberto [name] 1955/09/25 [id number]  10/10/23   Dear Dr. Olevia Perches:  We have scheduled the above named patient for a colonoscopy procedure. Our records show that he is on anticoagulation therapy.  Please advise as to whether the patient may come off their therapy of Warfarin 5 days prior to their procedure which is scheduled for 12/13/23.  Please route your response to Wendi Maya, RMA or fax response to [phone].  Sincerely,     Gastroenterology

## 2023-10-10 NOTE — Patient Instructions (Addendum)
 You have been scheduled for a colonoscopy. Please follow written instructions given to you at your visit today.   If you use inhalers (even only as needed), please bring them with you on the day of your procedure.  DO NOT TAKE 7 DAYS PRIOR TO TEST- Trulicity (dulaglutide) Ozempic, Wegovy (semaglutide) Mounjaro (tirzepatide) Bydureon Bcise (exanatide extended release)  DO NOT TAKE 1 DAY PRIOR TO YOUR TEST Rybelsus (semaglutide) Adlyxin (lixisenatide) Victoza (liraglutide) Byetta (exanatide) ___________________________________________________________________________  Bonita Quin will be contaced by our office prior to your procedure for directions on holding your Coumadin/Warfarin.  If you do not hear from our office 1 week prior to your scheduled procedure, please call [phone] to discuss.   We have sent the following medications to your pharmacy for you to pick up at your convenience: Suprep  Thank you for entrusting me with your care and for choosing [facility], Dr. Maren Beach  _______________________________________________________  If your blood pressure at your visit was 140/90 or greater, please contact your primary care physician to follow up on this.  _______________________________________________________  If you are age 61 or older, your body mass index should be between 23-30. Your Body mass index is 41.64 kg/m. If this is out of the aforementioned range listed, please consider follow up with your Primary Care Provider.  If you are age 30 or younger, your body mass index should be between 19-25. Your Body mass index is 41.64 kg/m. If this is out of the aformentioned range listed, please consider follow up with your Primary Care Provider.   ________________________________________________________  The [facility] providers would like to encourage you to use [facility] to communicate with providers for non-urgent requests or questions.  Due to long hold times on the  telephone, sending your provider a message by [facility] may be a faster and more efficient way to get a response.  Please allow 48 business hours for a response.  Please remember that this is for non-urgent requests.  _______________________________________________________

## 2023-10-10 NOTE — Patient Instructions (Signed)
 Medication Instructions:  Your physician recommends that you continue on your current medications as directed. Please refer to the Current Medication list given to you today.   *If you need a refill on your cardiac medications before your next appointment, please call your pharmacy*  Lab Work: Your provider would like for you to have following labs drawn today (BMP).    Testing/Procedures: No test ordered today   Follow-Up: At [facility], you and your health needs are our priority.  As part of our continuing mission to provide you with exceptional heart care, our providers are all part of one team.  This team includes your primary Cardiologist (physician) and Advanced Practice Providers or APPs (Physician Assistants and Nurse Practitioners) who all work together to provide you with the care you need, when you need it.  Your next appointment:   6 month(s)  Provider:   You may see Yvonne Kendall, MD or one of the following Advanced Practice Providers on your designated Care Team:   Nicolasa Ducking, NP Ames Dura, [name] Eula Listen, [name] Cadence Hazleton, [name] Charlsie Quest, NP Carlos Levering, NP    We recommend signing up for the patient portal called "MyChart".  Sign up information is provided on this After Visit Summary.  MyChart is used to connect with patients for Virtual Visits (Telemedicine).  Patients are able to view lab/test results, encounter notes, upcoming appointments, etc.  Non-urgent messages can be sent to your provider as well.   To learn more about what you can do with MyChart, go to [email or web address].

## 2023-10-11 ENCOUNTER — Encounter: Payer: Self-pay | Admitting: Pediatrics

## 2023-10-11 LAB — BASIC METABOLIC PANEL WITH GFR
BUN/Creatinine Ratio: 13 (ref 10–24)
BUN: 21 mg/dL (ref 8–27)
CO2: 24 mmol/L (ref 20–29)
Calcium: 9.7 mg/dL (ref 8.6–10.2)
Chloride: 100 mmol/L (ref 96–106)
Creatinine, Ser: 1.68 mg/dL — ABNORMAL HIGH (ref 0.76–1.27)
Glucose: 90 mg/dL (ref 70–99)
Potassium: 4.5 mmol/L (ref 3.5–5.2)
Sodium: 139 mmol/L (ref 134–144)
eGFR: 44 mL/min/{1.73_m2} — ABNORMAL LOW (ref >59)

## 2023-10-11 LAB — POCT INR: POC INR: 2.4

## 2023-10-12 ENCOUNTER — Other Ambulatory Visit: Payer: Self-pay

## 2023-10-12 ENCOUNTER — Telehealth: Payer: Self-pay

## 2023-10-12 DIAGNOSIS — R748 Abnormal levels of other serum enzymes: Secondary | ICD-10-CM

## 2023-10-12 NOTE — Telephone Encounter (Signed)
 Pt friend/caregiver  had questions about the recently elevated liver enzymes. Pt friend/caregiver verbalized understanding with all questions answered.

## 2023-10-12 NOTE — Telephone Encounter (Signed)
 Patient friend named Selena Batten called and stated that she would like to know what lab work shows that the patient have low liver function. Patient friend is requesting a call back.Please advise.

## 2023-10-12 NOTE — Telephone Encounter (Signed)
 Patient cannot come off anticoagulation- had renal infarct last time tried. Please get him in to discuss lovanox prior to his colonoscopy

## 2023-10-12 NOTE — Telephone Encounter (Signed)
 Pt friend/caregiver Selena Batten was notified of recent results and Dr. Doy Hutching recommendations.  Pt was ordered and scheduled for the [place] on 10/22/2023 at [facility] at 9:00 AM. Pt to arrive at 8:30 AM. Nothing to eat or drink 6 hours prior. [name]  verbalized understanding with all questions answered.

## 2023-10-12 NOTE — Telephone Encounter (Signed)
-----   Message from Ottie Glazier sent at 10/11/2023  9:47 PM EDT ----- Regarding: Liver ultrasound Hi nurses-  I recently saw [name] in the office to discuss performing a future colonoscopy.  In reviewing his chart, I also noted that he has had mild elevation of his liver enzymes.  I sent him a MyChart message notifying him of this and letting him know that I wanted to schedule him for a liver ultrasound.  Can you please schedule him for a liver ultrasound with Doppler flows for an indication of elevated liver enzymes?  Thanks,  Harriett Sine

## 2023-10-15 NOTE — Telephone Encounter (Signed)
 Patient scheduled for 11/13/2023 as a regularly scheduled follow up. Will allow discussion prior to scheduled procedure.

## 2023-10-16 LAB — POCT INR: POC INR: 2.4

## 2023-10-19 ENCOUNTER — Other Ambulatory Visit: Payer: Self-pay

## 2023-10-19 VITALS — Wt 263.0 lb

## 2023-10-19 DIAGNOSIS — J449 Chronic obstructive pulmonary disease, unspecified: Secondary | ICD-10-CM

## 2023-10-19 DIAGNOSIS — E1159 Type 2 diabetes mellitus with other circulatory complications: Secondary | ICD-10-CM

## 2023-10-19 DIAGNOSIS — I4891 Unspecified atrial fibrillation: Secondary | ICD-10-CM

## 2023-10-19 NOTE — Patient Instructions (Signed)
  Reminder: Referral

## 2023-10-19 NOTE — Patient Outreach (Unsigned)
 Complex Care Management   Visit Note  10/19/2023  Name:  Roberto [name] MRN: [id number] DOB: 16-Jun-1956  Situation: Referral received for Complex Care Management related to {Criteria:32550} I obtained verbal consent from {CHL AMB Patient/Caregiver:28184}.  Visit completed with ***  {VISIT LOCATION:32553}  Background:   Past Medical History:  Diagnosis Date   Aneurysm (HCC)    Bicuspid aortic valve    a. s/p #27 Carbomedics mechanical valve on 03/25/2010; b. on Coumadin ; c. TTE 12/17: EF 40-45%, moderately dilated LV with moderate LVH, AVR well-seated with 14 mmHg gradient, peak AV velocity 2.5 m/s, mild mitral valve thickening with mild MR, mildly dilated RV with mildly reduced contraction   Cellulitis    CHF (congestive heart failure) (HCC)    Chronic kidney disease    Chronic systolic CHF (congestive heart failure) (HCC)    a. R/LHC 03/2010 showed no significant CAD, LVEDP 31 mmHg, mean AoV gradient 34 mmHg at rest and 47 mmHg with dobutamine 20 mcg/kg/min, AVA 1.0 cm^2, RA 31, RV 68/25, PA 68/47, PCWP 38. PA sat 65%. CO 6.2 L/min (Fick) and 5.3 L/min (thermodilution)   Clotting disorder (HCC)    COPD (chronic obstructive pulmonary disease) (HCC)    H/O mechanical aortic valve replacement 03/25/2010   a. #27 Carbomedics mechanical valve   Hearing loss    High cholesterol    HTN (hypertension)    Hypercholesterolemia    Renal infarct [facility]) 2017   Multiple right renal infarcts, likely embolic.   Stage 3 chronic kidney disease (HCC)    Stroke [facility])    TIA (transient ischemic attack) 05/2014    Assessment: Patient Reported Symptoms:  Cognitive        Neurological      HEENT        Cardiovascular      Respiratory      Endocrine      Gastrointestinal        Genitourinary      Integumentary      Musculoskeletal          Psychosocial              10/05/2023    1:52 PM  Depression screen PHQ 2/9  Decreased Interest 0  Down, Depressed, Hopeless 0  PHQ - 2  Score 0    There were no vitals filed for this visit.  Medications Reviewed Today     Reviewed by Roxie Cord, RN (Registered Nurse) on 10/19/23 at 1606  Med List Status: <None>   Medication Order Taking? Sig Documenting Provider Last Dose Status Informant  acetaminophen  (TYLENOL ) 500 MG tablet [id number] No Take 1,000 mg by mouth every 6 (six) hours as needed for moderate pain (pain score 4-6). [provider] Taking Active Friend, Pharmacy Records, Self  albuterol  (PROVENTIL ) (2.5 MG/3ML) 0.083% nebulizer solution [id number] No USE 1 VIAL IN NEBULIZER EVERY 6 HOURS AS NEEDED FOR WHEEZING FOR SHORTNESS OF BREATH [name], [name], DO Taking Active   allopurinol  (ZYLOPRIM ) 300 MG tablet [id number] No Take 1 tablet (300 mg total) by mouth daily. [name], DO Taking Active Friend, Pharmacy Records, Self  amiodarone  (PACERONE ) 200 MG tablet [id number] No Take 200 mg by mouth daily. [provider] Taking Active   aspirin  81 MG chewable tablet [id number] No Chew 81 mg by mouth in the morning. [provider] Taking Active Friend, Pharmacy Records, Self  buPROPion  (WELLBUTRIN  XL) 300 MG 24 hr tablet [id number] No Take 300 mg by mouth daily. [provider]  Taking Active Friend, Pharmacy Records, Self           Med Note Arlena Lacrosse, Zacarias Hermann Sep 27, 2023 10:02 AM)    dapagliflozin  propanediol (FARXIGA ) 10 MG TABS tablet [id number] No Take 1 tablet (10 mg total) by mouth daily before breakfast. Charlette Console, FNP Taking Active Friend, Pharmacy Records, Self  fenofibrate  (TRICOR ) 48 MG tablet [id number] No Take 1 tablet (48 mg total) by mouth daily. End, Veryl Gottron, MD Taking Active Friend, Pharmacy Records, Self  losartan  (COZAAR ) 25 MG tablet [id number] No Take 1 tablet (25 mg total) by mouth daily. Sabharwal, Aditya, DO Taking Active   metFORMIN  (GLUCOPHAGE -XR) 500 MG 24 hr tablet [id number] No Take 1 tablet (500 mg total) by mouth daily with breakfast.  [name], DO Taking Active Friend, Pharmacy Records, Self  nortriptyline  (PAMELOR ) 10 MG capsule [id number] No Take 1 capsule (10 mg total) by mouth at bedtime. [name], DO Taking Active Friend, Pharmacy Records, Self  nystatin  (MYCOSTATIN ) 100000 UNIT/ML suspension [id number] No 5mL swish and spit TID  Patient not taking: Reported on 10/10/2023   [name], DO Not Taking Active   nystatin  (MYCOSTATIN /NYSTOP ) powder [id number] No Apply 1 Application topically 3 (three) times daily.  Patient not taking: Reported on 10/10/2023   [name], DO Not Taking Active   Omega-3 Fatty Acids (OMEGA 3 PO) [id number] No Take 1,500 mg by mouth 2 (two) times daily. [provider] Taking Active Friend, Pharmacy Records, Self  OZEMPIC , 1 MG/DOSE, 4 MG/3ML SOPN [id number] No Inject 1 mg into the skin once a week. On Mondays [provider] Taking Active Friend, Pharmacy Records, Self  potassium chloride  SA (KLOR-CON  M) 20 MEQ tablet [id number] No Take 20 mEq by mouth at bedtime. [provider] Taking Active Friend, Pharmacy Records, Self  rosuvastatin  (CRESTOR ) 40 MG tablet [id number] No Take 1 tablet (40 mg total) by mouth daily. Sammy Crisp, MD Taking Active Friend, Pharmacy Records, Self  Spacer/Aero-Holding Chambers (AEROCHAMBER MV) inhaler [id number] No Use as instructed  Patient not taking: Reported on 10/10/2023   Ethlyn Herd, MD Not Taking Active Friend, Pharmacy Records, Self  Spacer/Aero-Holding [facility] [id number] No Use as directed Dx: COPD, J44.9 [name], DO Taking Active Friend, Pharmacy Records, Self  spironolactone  (ALDACTONE ) 25 MG tablet [id number] No Take 0.5 tablets (12.5 mg total) by mouth daily. End, Veryl Gottron, MD Taking Active Friend, Pharmacy Records, Self  torsemide  (DEMADEX ) 20 MG tablet [id number] No Take 2 tablets (40 mg total) by mouth in the morning. Gollan, Timothy J, MD Taking Active   TRELEGY ELLIPTA  100-62.5-25  MCG/ACT AEPB [id number] No Inhale 1 puff by mouth once daily [name], DO Taking Active Friend, Pharmacy Records, Self  VENTOLIN  HFA 108 (90 Base) MCG/ACT inhaler [id number] No INHALE 2 PUFFS BY MOUTH EVERY 6 HOURS AS NEEDED FOR WHEEZING OR SHORTNESS OF BREATH [name], [name], DO Taking Active Friend, Pharmacy Records, Self  warfarin (COUMADIN ) 1 MG tablet [id number] No Take 0.5 tablets (0.5 mg total) by mouth daily. [name], DO Taking Active            Med Note (LLOYD, CRYSTAL L   Wed Sep 19, 2023 11:36 AM) Reports 3mg  dose last night   warfarin (COUMADIN ) 3 MG tablet [id number] No Take 1 tablet (3 mg total) by mouth daily. [name], DO Taking Active   Med List Note (Hagopian, Tyelisha L, CPhT 08/27/23 1743): DO NOT REFILL MEDICATIONS WITHOUT AN  APPOINTMENT / Friend Ms. [name] assists with his medication            Recommendation:   {RECOMMENDATONS:32554}  Follow Up Plan:   {FOLLOWUP:32559}  SIG ***

## 2023-10-20 LAB — POCT INR: POC INR: 2.5

## 2023-10-22 ENCOUNTER — Ambulatory Visit (HOSPITAL_COMMUNITY)
Admission: RE | Admit: 2023-10-22 | Discharge: 2023-10-22 | Disposition: A | Discharge status: Home / Self Care | Source: Ambulatory Visit | Attending: Pediatrics | Admitting: Pediatrics

## 2023-10-22 ENCOUNTER — Telehealth: Payer: Self-pay

## 2023-10-22 DIAGNOSIS — R748 Abnormal levels of other serum enzymes: Secondary | ICD-10-CM | POA: Diagnosis present

## 2023-10-22 DIAGNOSIS — R0602 Shortness of breath: Secondary | ICD-10-CM

## 2023-10-22 NOTE — Telephone Encounter (Signed)
 Need a reason why they want to go

## 2023-10-22 NOTE — Telephone Encounter (Signed)
 Copied from CRM [id number]. Topic: Referral - Request for Referral >> Oct 22, 2023  7:38 AM Carlatta H wrote: Did the patient discuss referral with their provider in the last year? Yes (If No - schedule appointment) (If Yes - send message)  Appointment offered? No  Type of order/referral and detailed reason for visit: Pulmomology  Preference of office, provider, location:  Dr Lucillie Saba  If referral order, have you been seen by this specialty before? No (If Yes, this issue or another issue? When? Where?  Can we respond through MyChart? No

## 2023-10-23 ENCOUNTER — Encounter: Payer: Self-pay | Admitting: Neurology

## 2023-10-23 ENCOUNTER — Ambulatory Visit (INDEPENDENT_AMBULATORY_CARE_PROVIDER_SITE_OTHER): Payer: Medicare Other | Admitting: Neurology

## 2023-10-23 VITALS — BP 108/72 | HR 86 | Ht 66.0 in | Wt 255.0 lb

## 2023-10-23 DIAGNOSIS — E1142 Type 2 diabetes mellitus with diabetic polyneuropathy: Secondary | ICD-10-CM | POA: Diagnosis not present

## 2023-10-23 DIAGNOSIS — G250 Essential tremor: Secondary | ICD-10-CM | POA: Insufficient documentation

## 2023-10-23 DIAGNOSIS — Z7984 Long term (current) use of oral hypoglycemic drugs: Secondary | ICD-10-CM

## 2023-10-23 LAB — POCT INR: POC INR: 3

## 2023-10-23 NOTE — Progress Notes (Signed)
 Chief Complaint  Patient presents with   New Patient (Initial Visit)    Pt in 15, here with caregiver [name]  Pt is referred for Tremor. Pt states the tremors are on both hands. States they have gotten worse within the last 5 months after getting a pacemaker. Pt states he has also noticing having trouble with his balance.       ASSESSMENT AND PLAN  Roberto [name] is a 68 y.o. male   Diabetic peripheral neuropathy Essential tremor  Normal TSH,  Discussed treatment options, such as propranolol, primidone, clonazepam, even deep brain stimulation,  He worries about the polypharmacy side effect, decided not to proceed with pharmaceutical treatment, I have suggested wrist weight, weighted utensil,   DIAGNOSTIC DATA (LABS, IMAGING, TESTING) - I reviewed patient records, labs, notes, testing and imaging myself where available.   MEDICAL HISTORY:  Roberto [name], is a 68 year old male, accompanied by his partner Roberto [name]   seen in request by his primary care from [facility] Dr. Lincoln Renshaw, Jacqlyn Matas for evaluation of tremor, initial evaluation was on October 23, 2023  History is obtained from the patient and review of electronic medical records. I personally reviewed pertinent available imaging films in PACS.   PMHx of  DM HLD HTN Bicuspid aortic valve replacement, on Coumadin  CHF COPD Renal infarction (embolic) Stroke Pacemaker placement on Jun 21 2023, tachycardia,   He began to notice intermittent bilateral hands tremor since age 67s, gradually getting worse, denies family history of such, increased tremor since his pacemaker placement in December 2024, to the point he has difficulty feeding himself use fork,  He has mild gait abnormality, do have bilateral foot paresthesia,  PHYSICAL EXAM:   Vitals:   10/23/23 1514  BP: 108/72  Pulse: 86  Weight: 255 lb (115.7 kg)  Height: 5\' 6"  (1.676 m)     Body mass index is 41.16 kg/m.  PHYSICAL EXAMNIATION:  Gen: NAD, conversant, well  nourised, well groomed                     Cardiovascular: Regular rate rhythm, no peripheral edema, warm, nontender. Eyes: Conjunctivae clear without exudates or hemorrhage Neck: Supple, no carotid bruits. Pulmonary: Clear to auscultation bilaterally   NEUROLOGICAL EXAM:  MENTAL STATUS: Speech/cognition: Awake, alert, oriented to history taking and casual conversation CRANIAL NERVES: CN II: Visual fields are full to confrontation. Pupils are round equal and briskly reactive to light. CN III, IV, VI: extraocular movement are normal. No ptosis. CN V: Facial sensation is intact to light touch CN VII: Face is symmetric with normal eye closure  CN VIII: Hearing is normal to causal conversation. CN IX, X: Phonation is normal. CN XI: Head turning and shoulder shrug are intact  MOTOR: Posturing tremor, moderate difficulty drawing spiral circle, no weakness  REFLEXES: Reflexes are 1 and symmetric at the biceps, triceps, knees, and absent at ankles. Plantar responses are flexor.  SENSORY: Length-dependent decreased light touch, pinprick to ankle level  COORDINATION: There is no trunk or limb dysmetria noted.  GAIT/STANCE: Posture is normal. Gait is steady, but limited by his big body habitus  REVIEW OF SYSTEMS:  Full 14 system review of systems performed and notable only for as above All other review of systems were negative.   ALLERGIES: Allergies  Allergen Reactions   Nicotine      Patches caused localized rash and hives     HOME MEDICATIONS: Current Outpatient Medications  Medication Sig Dispense Refill   acetaminophen  (TYLENOL ) 500 MG  tablet Take 1,000 mg by mouth every 6 (six) hours as needed for moderate pain (pain score 4-6).     albuterol  (PROVENTIL ) (2.5 MG/3ML) 0.083% nebulizer solution USE 1 VIAL IN NEBULIZER EVERY 6 HOURS AS NEEDED FOR WHEEZING FOR SHORTNESS OF BREATH 180 mL 1   allopurinol  (ZYLOPRIM ) 300 MG tablet Take 1 tablet (300 mg total) by mouth daily. 90  tablet 1   amiodarone  (PACERONE ) 200 MG tablet Take 200 mg by mouth daily.     aspirin  81 MG chewable tablet Chew 81 mg by mouth in the morning.     buPROPion  (WELLBUTRIN  XL) 300 MG 24 hr tablet Take 300 mg by mouth daily.     dapagliflozin  propanediol (FARXIGA ) 10 MG TABS tablet Take 1 tablet (10 mg total) by mouth daily before breakfast. 30 tablet 11   fenofibrate  (TRICOR ) 48 MG tablet Take 1 tablet (48 mg total) by mouth daily. 90 tablet 3   losartan  (COZAAR ) 25 MG tablet Take 1 tablet (25 mg total) by mouth daily. 90 tablet 3   metFORMIN  (GLUCOPHAGE -XR) 500 MG 24 hr tablet Take 1 tablet (500 mg total) by mouth daily with breakfast. 90 tablet 1   nortriptyline  (PAMELOR ) 10 MG capsule Take 1 capsule (10 mg total) by mouth at bedtime. 90 capsule 1   Omega-3 Fatty Acids (OMEGA 3 PO) Take 1,500 mg by mouth 2 (two) times daily.     OZEMPIC , 1 MG/DOSE, 4 MG/3ML SOPN Inject 1 mg into the skin once a week. On Mondays     potassium chloride  SA (KLOR-CON  M) 20 MEQ tablet Take 20 mEq by mouth at bedtime.     rosuvastatin  (CRESTOR ) 40 MG tablet Take 1 tablet (40 mg total) by mouth daily. 90 tablet 3   Spacer/Aero-Holding Chambers (AEROCHAMBER MV) inhaler Use as instructed 1 each 1   Spacer/Aero-Holding Chambers DEVI Use as directed Dx: COPD, J44.9 1 each 1   spironolactone  (ALDACTONE ) 25 MG tablet Take 0.5 tablets (12.5 mg total) by mouth daily. 45 tablet 3   torsemide  (DEMADEX ) 20 MG tablet Take 2 tablets (40 mg total) by mouth in the morning. 60 tablet 0   TRELEGY ELLIPTA  100-62.5-25 MCG/ACT AEPB Inhale 1 puff by mouth once daily 180 each 1   VENTOLIN  HFA 108 (90 Base) MCG/ACT inhaler INHALE 2 PUFFS BY MOUTH EVERY 6 HOURS AS NEEDED FOR WHEEZING OR SHORTNESS OF BREATH 54 g 6   warfarin (COUMADIN ) 1 MG tablet Take 0.5 tablets (0.5 mg total) by mouth daily. 30 tablet 2   warfarin (COUMADIN ) 3 MG tablet Take 1 tablet (3 mg total) by mouth daily. 30 tablet 3   nystatin  (MYCOSTATIN ) 100000 UNIT/ML suspension  5mL swish and spit TID (Patient not taking: Reported on 10/23/2023) 60 mL 1   nystatin  (MYCOSTATIN /NYSTOP ) powder Apply 1 Application topically 3 (three) times daily. (Patient not taking: Reported on 10/23/2023) 15 g 3   No current facility-administered medications for this visit.    PAST MEDICAL HISTORY: Past Medical History:  Diagnosis Date   Aneurysm (HCC)    Bicuspid aortic valve    a. s/p #27 Carbomedics mechanical valve on 03/25/2010; b. on Coumadin ; c. TTE 12/17: EF 40-45%, moderately dilated LV with moderate LVH, AVR well-seated with 14 mmHg gradient, peak AV velocity 2.5 m/s, mild mitral valve thickening with mild MR, mildly dilated RV with mildly reduced contraction   Cellulitis    CHF (congestive heart failure) (HCC)    Chronic kidney disease    Chronic systolic CHF (congestive heart  failure) (HCC)    a. R/LHC 03/2010 showed no significant CAD, LVEDP 31 mmHg, mean AoV gradient 34 mmHg at rest and 47 mmHg with dobutamine 20 mcg/kg/min, AVA 1.0 cm^2, RA 31, RV 68/25, PA 68/47, PCWP 38. PA sat 65%. CO 6.2 L/min (Fick) and 5.3 L/min (thermodilution)   Clotting disorder (HCC)    COPD (chronic obstructive pulmonary disease) (HCC)    H/O mechanical aortic valve replacement 03/25/2010   a. #27 Carbomedics mechanical valve   Hearing loss    High cholesterol    HTN (hypertension)    Hypercholesterolemia    Renal infarct [facility]) 2017   Multiple right renal infarcts, likely embolic.   Stage 3 chronic kidney disease (HCC)    Stroke [facility])    TIA (transient ischemic attack) 05/2014    PAST SURGICAL HISTORY: Past Surgical History:  Procedure Laterality Date   AORTIC VALVE REPLACEMENT     AORTIC VALVE SURGERY     CARDIAC CATHETERIZATION  03/21/2010   No significant CAD. Severe aortic stenosis. Severely elevated left and right heart filling pressures.   CARDIAC SURGERY  2009   CHF   CARDIOVERSION N/A 08/28/2023   Procedure: CARDIOVERSION;  Surgeon: Sheryle Donning, MD;  Location:  [facility] INVASIVE CV LAB;  Service: Cardiovascular;  Laterality: N/A;   CARPAL TUNNEL RELEASE Left 2005   ICD IMPLANT N/A 06/25/2023   Procedure: ICD IMPLANT;  Surgeon: Tammie Fall, MD;  Location: [facility] INVASIVE CV LAB;  Service: Cardiovascular;  Laterality: N/A;   PACEMAKER IMPLANT     RIGHT HEART CATH AND CORONARY ANGIOGRAPHY N/A 01/28/2019   Procedure: RIGHT HEART CATH AND CORONARY ANGIOGRAPHY;  Surgeon: Sammy Crisp, MD;  Location: [facility] INVASIVE CV LAB;  Service: Cardiovascular;  Laterality: N/A;   RIGHT HEART CATH AND CORONARY ANGIOGRAPHY N/A 06/20/2023   Procedure: RIGHT HEART CATH AND CORONARY ANGIOGRAPHY;  Surgeon: Sammy Crisp, MD;  Location: [facility] INVASIVE CV LAB;  Service: Cardiovascular;  Laterality: N/A;   TONSILLECTOMY  1962    FAMILY HISTORY: Family History  Problem Relation Age of Onset   Arthritis Mother    Dementia Mother    Colon cancer Mother    Arthritis Father    Diabetes Father    Stroke Father    Colon cancer Father    Heart attack Brother    Breast cancer Sister    Seizures Sister    Cancer Brother        brain   Heart disease Brother    Heart attack Brother     SOCIAL HISTORY: Social History   Socioeconomic History   Marital status: Divorced    Spouse name: Not on file   Number of children: 1   Years of education: 14   Highest education level: Associate degree: academic program  Occupational History   Occupation: Disabled    Employer: UNEMPLOYED  Tobacco Use   Smoking status: Every Day    Current packs/day: 0.20    Average packs/day: 0.5 packs/day for 47.8 years (23.7 ttl pk-yrs)    Types: Cigarettes    Start date: 1974    Last attempt to quit: 2021   Smokeless tobacco: Never   Tobacco comments:    3 or less cigarettes per day  Vaping Use   Vaping status: Never Used  Substance and Sexual Activity   Alcohol use: Never   Drug use: Never   Sexual activity: Not Currently  Other Topics Concern   Not on file  Social History Narrative    Not on  file   Social Drivers of Health   Financial Resource Strain: Medium Risk (09/04/2023)   Overall Financial Resource Strain (CARDIA)    Difficulty of Paying Living Expenses: Somewhat hard  Food Insecurity: No Food Insecurity (10/19/2023)   Hunger Vital Sign    Worried About Running Out of Food in the Last Year: Never true    Ran Out of Food in the Last Year: Never true  Recent Concern: Food Insecurity - Food Insecurity Present (09/03/2023)   Hunger Vital Sign    Worried About Running Out of Food in the Last Year: Sometimes true    Ran Out of Food in the Last Year: Sometimes true  Transportation Needs: No Transportation Needs (10/19/2023)   PRAPARE - Administrator, Civil Service (Medical): No    Lack of Transportation (Non-Medical): No  Physical Activity: Inactive (09/04/2023)   Exercise Vital Sign    Days of Exercise per Week: 0 days    Minutes of Exercise per Session: 0 min  Stress: Stress Concern Present (09/04/2023)   [employer] of Occupational Health - Occupational Stress Questionnaire    Feeling of Stress : To some extent  Social Connections: Socially Isolated (10/19/2023)   Social Connection and Isolation Panel [NHANES]    Frequency of Communication with Friends and Family: More than three times a week    Frequency of Social Gatherings with Friends and Family: More than three times a week    Attends Religious Services: Never    Database administrator or Organizations: No    Attends Banker Meetings: Never    Marital Status: Divorced  Catering manager Violence: Not At Risk (10/05/2023)   Humiliation, Afraid, Rape, and Kick questionnaire    Fear of Current or Ex-Partner: No    Emotionally Abused: No    Physically Abused: No    Sexually Abused: No      Phebe Brasil, M.D. Ph.D.  [facility] [address] [place], [place] [address] Ph: [phone] Fax: [phone]  CC:  Sammy Crisp, MD [address] [place],  [place] [address]  Terre Ferri P, DO

## 2023-10-24 ENCOUNTER — Ambulatory Visit: Payer: Self-pay | Admitting: *Deleted

## 2023-10-24 ENCOUNTER — Other Ambulatory Visit: Payer: Self-pay

## 2023-10-24 LAB — POCT INR: POC INR: 1.4

## 2023-10-25 NOTE — Telephone Encounter (Signed)
 The referral is for the fluid in his chest. They are questioning since his lungs are clear but that he still has issues with it and wondering if it might be something else. [name] she's the Dr. Lonn Roads they want the referral to and Dr. Lonn Roads has agreed to take referral.

## 2023-10-25 NOTE — Patient Outreach (Signed)
 Complex Care Management   Visit Note  10/25/2023 late entry  Name:  Roberto [name] MRN: [id number] DOB: [birth year]  Situation: Referral received for Complex Care Management related to Atrial Fibrillation, Pulmonary Disease and SDOH Barriers:  Financial Resource Strain I obtained verbal consent from Patient.  Visit completed with patient on 10/24/23  on the phone  Background:   Past Medical History:  Diagnosis Date   Aneurysm (HCC)    Bicuspid aortic valve    a. s/p #27 Carbomedics mechanical valve on 03/25/2010; b. on Coumadin ; c. TTE 12/17: EF 40-45%, moderately dilated LV with moderate LVH, AVR well-seated with 14 mmHg gradient, peak AV velocity 2.5 m/s, mild mitral valve thickening with mild MR, mildly dilated RV with mildly reduced contraction   Cellulitis    CHF (congestive heart failure) (HCC)    Chronic kidney disease    Chronic systolic CHF (congestive heart failure) (HCC)    a. R/LHC 03/2010 showed no significant CAD, LVEDP 31 mmHg, mean AoV gradient 34 mmHg at rest and 47 mmHg with dobutamine 20 mcg/kg/min, AVA 1.0 cm^2, RA 31, RV 68/25, PA 68/47, PCWP 38. PA sat 65%. CO 6.2 L/min (Fick) and 5.3 L/min (thermodilution)   Clotting disorder (HCC)    COPD (chronic obstructive pulmonary disease) (HCC)    H/O mechanical aortic valve replacement 03/25/2010   a. #27 Carbomedics mechanical valve   Hearing loss    High cholesterol    HTN (hypertension)    Hypercholesterolemia    Renal infarct [facility]) 2017   Multiple right renal infarcts, likely embolic.   Stage 3 chronic kidney disease (HCC)    Stroke (HCC)    TIA (transient ischemic attack) 05/2014    Assessment: Patient Reported Symptoms:  Cognitive        Neurological Neurological Review of Symptoms: Weakness Neurological Management Strategies: Adequate rest, Coping strategies, Medical device, Routine screening Neurological Self-Management Outcome: 3 (uncertain) Neurological Comment: saw Neurologist on 10/23/23 says patient  has essential tremors  HEENT HEENT Symptoms Reported: Change or loss of hearing (has bilateral hearing aids) HEENT Conditions: Ear problem(s) HEENT Management Strategies: Coping strategies, Medical device, Routine screening HEENT Self-Management Outcome: 4 (good) Ear problem(s)  Cardiovascular Cardiovascular Symptoms Reported: No symptoms reported Cardiovascular Conditions: Dysrhythmia, Heart failure, High blood cholesterol, Hypertension, Valvular disease Cardiovascular Management Strategies: Adequate rest, Diet modification, Medical device, Medication therapy, Weight management Do You Have a Working Readable Scale?: Yes Weight: 255 lb (115.7 kg) Cardiovascular Comment: will follow up with cardiologist in 1 year  Respiratory Respiratory Symptoms Reported: Productive cough Respiratory Conditions: COPD Respiratory Self-Management Outcome: 4 (good) Respiratory Comment: Reports nonproductive cough "feels like something is coming up but nothing comes up" requesting referral to the Pulmonologist Dr. Lonn Roads in [facility]  Endocrine Patient reports the following symptoms related to hypoglycemia or hyperglycemia : No symptoms reported Is patient diabetic?: Yes Is patient checking blood sugars at home?: Yes Endocrine Conditions: Diabetes Endocrine Management Strategies: Diet modification, Medical device, Medication therapy Endocrine Self-Management Outcome: 3 (uncertain)  Gastrointestinal Gastrointestinal Symptoms Reported: No symptoms reported      Genitourinary Genitourinary Symptoms Reported: No symptoms reported    Integumentary Integumentary Symptoms Reported: No symptoms reported    Musculoskeletal Musculoskelatal Symptoms Reviewed: Unsteady gait Additional Musculoskeletal Details: previously referred for [facility] services through [facility]-will need new referral patient was unable to be contacted previously Musculoskeletal Conditions: Osteoarthritis, Mobility limited, Unsteady  gait Musculoskeletal Management Strategies: Coping strategies, Medical device, Routine screening Musculoskeletal Self-Management Outcome: 3 (uncertain) Musculoskeletal Comment: encouraged tp use his assistive  devices when ambulating to avoid falls Falls in the past year?: No Number of falls in past year: 1 or less Was there an injury with Fall?: No Fall Risk Category Calculator: 0 Patient Fall Risk Level: Low Fall Risk Patient at Risk for Falls Due to: Impaired mobility Fall risk Follow up: Falls prevention discussed  Psychosocial Psychosocial Symptoms Reported: Sadness - if selected complete PHQ 2-9 Behavioral Health Conditions: Depression Behavioral Management Strategies: Coping strategies, Medication therapy, Support group Behavioral Health Self-Management Outcome: 4 (good) Major Change/Loss/Stressor/Fears (CP): Medical condition, self Behaviors When Feeling Stressed/Fearful: "Fleeting thoughts, then often will go Techniques to Cope with Loss/Stress/Change: Diversional activities, Medication Quality of Family Relationships: supportive Do you feel physically threatened by others?: No      10/24/2023   11:09 AM  Depression screen PHQ 2/9  Decreased Interest 0  Down, Depressed, Hopeless 1  PHQ - 2 Score 1    There were no vitals filed for this visit.  Medications Reviewed Today     Reviewed by Ave Leisure, LCSW (Social Worker) on 10/24/23 at 1103  Med List Status: <None>   Medication Order Taking? Sig Documenting Provider Last Dose Status Informant  acetaminophen  (TYLENOL ) 500 MG tablet [id number] Yes Take 1,000 mg by mouth every 6 (six) hours as needed for moderate pain (pain score 4-6). [provider] Taking Active Friend, Pharmacy Records, Self  albuterol  (PROVENTIL ) (2.5 MG/3ML) 0.083% nebulizer solution [id number] Yes USE 1 VIAL IN NEBULIZER EVERY 6 HOURS AS NEEDED FOR WHEEZING FOR SHORTNESS OF BREATH [name], [name], DO Taking Active   allopurinol  (ZYLOPRIM )  300 MG tablet [id number] Yes Take 1 tablet (300 mg total) by mouth daily. [name], DO Taking Active Friend, Pharmacy Records, Self  amiodarone  (PACERONE ) 200 MG tablet [id number] Yes Take 200 mg by mouth daily. [provider] Taking Active   aspirin  81 MG chewable tablet [id number] Yes Chew 81 mg by mouth in the morning. [provider] Taking Active Friend, Pharmacy Records, Self  buPROPion  (WELLBUTRIN  XL) 300 MG 24 hr tablet [id number] Yes Take 300 mg by mouth daily. [provider] Taking Active Friend, Pharmacy Records, Self           Med Note Arlena Lacrosse, Zacarias Hermann Sep 27, 2023 10:02 AM)    dapagliflozin  propanediol (FARXIGA ) 10 MG TABS tablet [id number] Yes Take 1 tablet (10 mg total) by mouth daily before breakfast. Charlette Console, FNP Taking Active Friend, Pharmacy Records, Self  fenofibrate  (TRICOR ) 48 MG tablet [id number] Yes Take 1 tablet (48 mg total) by mouth daily. End, Veryl Gottron, MD Taking Active Friend, Pharmacy Records, Self  losartan  (COZAAR ) 25 MG tablet [id number] Yes Take 1 tablet (25 mg total) by mouth daily. Sabharwal, Aditya, DO Taking Active   metFORMIN  (GLUCOPHAGE -XR) 500 MG 24 hr tablet [id number] Yes Take 1 tablet (500 mg total) by mouth daily with breakfast. [name], DO Taking Active Friend, Pharmacy Records, Self  nortriptyline  (PAMELOR ) 10 MG capsule [id number] Yes Take 1 capsule (10 mg total) by mouth at bedtime. [name], DO Taking Active Friend, Pharmacy Records, Self  nystatin  (MYCOSTATIN ) 100000 UNIT/ML suspension [id number] No 5mL swish and spit TID  Patient not taking: Reported on 10/24/2023   [name], DO Not Taking Active   nystatin  (MYCOSTATIN /NYSTOP ) powder [id number] No Apply 1 Application topically 3 (three) times daily.  Patient not taking: Reported on 10/24/2023   [name], DO Not Taking Active   Omega-3 Fatty Acids (OMEGA 3 PO)  [id number] Yes Take 1,500 mg by mouth 2 (two) times daily. [provider] Taking Active Friend, Pharmacy Records, Self  OZEMPIC , 1 MG/DOSE, 4 MG/3ML SOPN [id number] Yes Inject 1 mg into the skin once a week. On Mondays [provider] Taking Active Friend, Pharmacy Records, Self  potassium chloride  SA (KLOR-CON  M) 20 MEQ tablet [id number] Yes Take 20 mEq by mouth at bedtime. [provider] Taking Active Friend, Pharmacy Records, Self  rosuvastatin  (CRESTOR ) 40 MG tablet [id number] Yes Take 1 tablet (40 mg total) by mouth daily. Sammy Crisp, MD Taking Active Friend, Pharmacy Records, Self  Spacer/Aero-Holding Chambers (AEROCHAMBER MV) inhaler [id number] Yes Use as instructed Ethlyn Herd, MD Taking Active Friend, Pharmacy Records, Self  Spacer/Aero-Holding Idelle Majors [facility] [id number] Yes Use as directed Dx: COPD, J44.9 [name], DO Taking Active Friend, Pharmacy Records, Self  spironolactone  (ALDACTONE ) 25 MG tablet [id number] Yes Take 0.5 tablets (12.5 mg total) by mouth daily. End, Veryl Gottron, MD Taking Active Friend, Pharmacy Records, Self  torsemide  (DEMADEX ) 20 MG tablet [id number] Yes Take 2 tablets (40 mg total) by mouth in the morning. Gollan, Timothy J, MD Taking Active   TRELEGY ELLIPTA  100-62.5-25 MCG/ACT AEPB [id number] Yes Inhale 1 puff by mouth once daily [name], DO Taking Active Friend, Pharmacy Records, Self  VENTOLIN  HFA 108 [phone] Base) MCG/ACT inhaler [id number] Yes INHALE 2 PUFFS BY MOUTH EVERY 6 HOURS AS NEEDED FOR WHEEZING OR SHORTNESS OF BREATH [name], [name], DO Taking Active Friend, Pharmacy Records, Self  warfarin (COUMADIN ) 1 MG tablet [id number] Yes Take 0.5 tablets (0.5 mg total) by mouth daily. [name], DO Taking Active            Med Note (LLOYD, CRYSTAL L   Wed Sep 19, 2023 11:36 AM) Reports 3mg  dose last night   warfarin (COUMADIN ) 3 MG tablet [id number] Yes Take 1 tablet (3 mg total) by mouth daily. [name], DO Taking Active            Med Note (Riham Polyakov M   Wed Oct 24, 2023 11:02 AM) Taking 1mg  by mouth  Med List Note (Hagopian, Tyelisha L, CPhT 08/27/23 1743): DO NOT REFILL MEDICATIONS WITHOUT AN APPOINTMENT / Belvia Boyer Ms. [name] assists with his medication            Recommendation:   Home Health requests: Physical therapy Skilled Nursing  Follow Up Plan:   Telephone follow-up 11/05/23  Michaelle Adolphus, LCSW [place]  [facility], [facility] Licensed Clinical Social Worker Care Coordinator  Direct Dial: [phone]

## 2023-10-25 NOTE — Patient Instructions (Signed)
 Visit Information  Mr. Jarnigan was given information about Medicaid Managed Care team care coordination services as a part of their [facility] Community Plan Medicaid benefit. Domenique Plott verbally consented to engagement with the [facility] Managed Care team.   If you are experiencing a medical emergency, please call 911 or report to your local emergency department or urgent care.   If you have a non-emergency medical problem during routine business hours, please contact your provider's office and ask to speak with a nurse.   For questions related to your [facility], please call: [phone] or visit the homepage here: [email or web address]  If you would like to schedule transportation through your [facility], please call the following number at least 2 days in advance of your appointment: [phone]   Rides for urgent appointments can also be made after hours by calling Member Services.  Call the Behavioral Health Crisis Line at [phone], at any time, 24 hours a day, 7 days a week. If you are in danger or need immediate medical attention call 911.  If you would like help to quit smoking, call 1-800-QUIT-NOW (810 025 2418) OR Espaol: 1-855-Djelo-Ya (1-324-401-0272) o para ms informacin haga clic aqu or Text READY to 536-644 to register via text  Mr. Laver - following are the goals we discussed in your visit today:   Goals Addressed             This Visit's Progress    VBCI Social Work Care Plan       Problems:   Lacks knowledge of how to connect  to community resources to assist with utility payments. Patient also needing evaluation for home health services  CSW Clinical Goal(s):   Over the next 90 days the Caregiver Patient will attend all scheduled medical appointments as evidenced by patient report and care team review of appointment completion in  electronic MEDICAL RECORD NUMBER5/13/25 will follow up with community agencies as directed by Social Work to assist with utility payments.   Interventions:  Level of Care Concerns in a patient with Atrial Fibrillation, DMII, and Pulmonary Disease Current level of care: Home with other family or significant other(s): friend  Evaluation of patient's unmet needs in current living environment [facility] contacted-confirmed that referral was closed for [facility] PT and nursing due to failure to reach-new referral to be requested by provider  Encouraged continued follow up with the [employer] regarding status of referral and paid caregiver services Confirmed that [place] will begin accepting applications for assistance May 5 Collaboration with [facility] regarding referral to pulmonologist  Patient Goals/Self-Care Activities:  Work with the [employer] CAP [phone] to regarding status of referral and paid caregiver services. Follow up with [facility] to schedule initial assessment once referral is placed Contact [facility] regarding utility assistance starting 11/05/23  Plan:   Telephone follow up appointment with care management team member scheduled for:  11/05/23         Patient verbalizes understanding of instructions and care plan provided today and agrees to view in MyChart. Active MyChart status and patient understanding of how to access instructions and care plan via MyChart confirmed with patient.     Licensed Clinical Social Worker will follow up on 11/05/23  Rodd Heft, LCSW [place]  [facility], [facility] Licensed Clinical Social Worker Care Coordinator  Direct Dial: [phone]     Following is a copy of your plan of care:  There are no care plans that you recently modified to display for this patient.

## 2023-10-27 LAB — POCT INR: POC INR: 3.4

## 2023-11-02 ENCOUNTER — Telehealth: Payer: Self-pay | Admitting: Pediatrics

## 2023-11-02 NOTE — Telephone Encounter (Signed)
 Roberto [name] is calling to get results of US . They were told it would be 5-10 days and they are very concerned. Please advise.

## 2023-11-02 NOTE — Telephone Encounter (Signed)
 I spoke to Roberto [name] and I advised her that we have access to the images but it says that the report is not final yet.  I explained that once Dr. Yvone Herd receives the report we will either call or send a MyChart message.  She said that she was concerned because it had been 2 weeks so far.  I apologized for the delay and I explained that the radiologist may be behind but we will notify her once the result comes in.

## 2023-11-03 ENCOUNTER — Other Ambulatory Visit: Payer: Self-pay | Admitting: Cardiovascular Disease

## 2023-11-05 ENCOUNTER — Other Ambulatory Visit: Payer: Self-pay | Admitting: *Deleted

## 2023-11-05 ENCOUNTER — Telehealth: Payer: Self-pay

## 2023-11-05 DIAGNOSIS — I5023 Acute on chronic systolic (congestive) heart failure: Secondary | ICD-10-CM

## 2023-11-05 NOTE — Telephone Encounter (Signed)
 Copied from CRM [id number]. Topic: General - Other >> Nov 01, 2023  4:45 PM Tiffany S wrote: Reason for CRM: Patient is out of strips to check his INR patient is requesting call back

## 2023-11-05 NOTE — Patient Instructions (Signed)
 Visit Information  Thank you for taking time to visit with me today. Please don't hesitate to contact me if I can be of assistance to you before our next scheduled appointment.  Your next care management appointment is by telephone on 11/19/23 at 10am  Telephone follow-up 11/19/23  Please call the care guide team at [phone] if you need to cancel, schedule, or reschedule an appointment.   Please call 911 if you are experiencing a Mental Health or Behavioral Health Crisis or need someone to talk to.  Bronsen Serano, LCSW [place]  [facility], [facility] Licensed Clinical Social Worker Care Coordinator  Direct Dial: [phone]

## 2023-11-05 NOTE — Patient Outreach (Addendum)
 Complex Care Management   Visit Note  11/05/2023  Name:  Roberto [name] MRN: [id number] DOB: [birth year]  Situation: Referral received for Complex Care Management related to Atrial Fibrillation, Pulmonary Disease and SDOH Barriers:  Financial Resource Strain  I obtained verbal consent from Patient.  Visit completed with patient's caregiver  on the phone  Background:   Past Medical History:  Diagnosis Date   Aneurysm (HCC)    Bicuspid aortic valve    a. s/p #27 Carbomedics mechanical valve on 03/25/2010; b. on Coumadin ; c. TTE 12/17: EF 40-45%, moderately dilated LV with moderate LVH, AVR well-seated with 14 mmHg gradient, peak AV velocity 2.5 m/s, mild mitral valve thickening with mild MR, mildly dilated RV with mildly reduced contraction   Cellulitis    CHF (congestive heart failure) (HCC)    Chronic kidney disease    Chronic systolic CHF (congestive heart failure) (HCC)    a. R/LHC 03/2010 showed no significant CAD, LVEDP 31 mmHg, mean AoV gradient 34 mmHg at rest and 47 mmHg with dobutamine 20 mcg/kg/min, AVA 1.0 cm^2, RA 31, RV 68/25, PA 68/47, PCWP 38. PA sat 65%. CO 6.2 L/min (Fick) and 5.3 L/min (thermodilution)   Clotting disorder (HCC)    COPD (chronic obstructive pulmonary disease) (HCC)    H/O mechanical aortic valve replacement 03/25/2010   a. #27 Carbomedics mechanical valve   Hearing loss    High cholesterol    HTN (hypertension)    Hypercholesterolemia    Renal infarct [facility]) 2017   Multiple right renal infarcts, likely embolic.   Stage 3 chronic kidney disease (HCC)    Stroke (HCC)    TIA (transient ischemic attack) 05/2014    Assessment: Patient Reported Symptoms:  Cognitive Cognitive Status: Other: (did not asses, spoke to caregiver only today)      Neurological Neurological Review of Symptoms: No symptoms reported Neurological Conditions:  (essential tremors) Neurological Management Strategies:  (currently assessing for medications to address his tremors-named  3 that he can possibly take(currenlty in review-will check with cardiologist before starting any new medication)) Neurological Comment: Neurologist seen on 10/23/23 [facility]  HEENT HEENT Symptoms Reported: Not assessed      Cardiovascular      Respiratory Respiratory Symptoms Reported: Productive cough Respiratory Conditions: COPD Respiratory Self-Management Outcome: 4 (good) Respiratory Comment: Provider placed referral for pulomomologsit on 10/25/23  Endocrine Patient reports the following symptoms related to hypoglycemia or hyperglycemia : Not assessed    Gastrointestinal Gastrointestinal Symptoms Reported: No symptoms reported      Genitourinary Genitourinary Symptoms Reported: No symptoms reported    Integumentary Integumentary Symptoms Reported: No symptoms reported    Musculoskeletal Musculoskelatal Symptoms Reviewed: Unsteady gait Musculoskeletal Conditions: Osteoarthritis, Unsteady gait Musculoskeletal Management Strategies: Coping strategies, Medical device, Routine screening Musculoskeletal Comment: CSW request referral for Home Heath PT/OT and nursing Falls in the past year?: No Number of falls in past year: 1 or less Was there an injury with Fall?: No Fall Risk Category Calculator: 0 Patient Fall Risk Level: Low Fall Risk Patient at Risk for Falls Due to: Impaired mobility  Psychosocial Psychosocial Symptoms Reported: Not assessed            10/24/2023   11:09 AM  Depression screen PHQ 2/9  Decreased Interest 0  Down, Depressed, Hopeless 1  PHQ - 2 Score 1    There were no vitals filed for this visit.  Medications Reviewed Today     Reviewed by Ave Leisure, LCSW (Social Worker) on 11/05/23 at [phone]  Med List Status: <None>   Medication Order Taking? Sig Documenting Provider Last Dose Status Informant  acetaminophen  (TYLENOL ) 500 MG tablet [id number] Yes Take 1,000 mg by mouth every 6 (six) hours as needed for moderate pain (pain score 4-6).  [provider] Taking Active Friend, Pharmacy Records, Self  albuterol  (PROVENTIL ) (2.5 MG/3ML) 0.083% nebulizer solution [id number] Yes USE 1 VIAL IN NEBULIZER EVERY 6 HOURS AS NEEDED FOR WHEEZING FOR SHORTNESS OF BREATH [name], [name], DO Taking Active   allopurinol  (ZYLOPRIM ) 300 MG tablet [id number] Yes Take 1 tablet (300 mg total) by mouth daily. [name], DO Taking Active Friend, Pharmacy Records, Self  amiodarone  (PACERONE ) 200 MG tablet [id number] Yes Take 200 mg by mouth daily. [provider] Taking Active   aspirin  81 MG chewable tablet [id number] Yes Chew 81 mg by mouth in the morning. [provider] Taking Active Friend, Pharmacy Records, Self  buPROPion  (WELLBUTRIN  XL) 300 MG 24 hr tablet [id number] Yes Take 300 mg by mouth daily. [provider] Taking Active Friend, Pharmacy Records, Self           Med Note Arlena Lacrosse, Zacarias Hermann Sep 27, 2023 10:02 AM)    dapagliflozin  propanediol (FARXIGA ) 10 MG TABS tablet [id number] Yes Take 1 tablet (10 mg total) by mouth daily before breakfast. Charlette Console, FNP Taking Active Friend, Pharmacy Records, Self  fenofibrate  (TRICOR ) 48 MG tablet [id number] Yes Take 1 tablet (48 mg total) by mouth daily. End, Veryl Gottron, MD Taking Active Friend, Pharmacy Records, Self  losartan  (COZAAR ) 25 MG tablet [id number] Yes Take 1 tablet (25 mg total) by mouth daily. Sabharwal, Aditya, DO Taking Active   metFORMIN  (GLUCOPHAGE -XR) 500 MG 24 hr tablet [id number] Yes Take 1 tablet (500 mg total) by mouth daily with breakfast. [name], DO Taking Active Friend, Pharmacy Records, Self  nortriptyline  (PAMELOR ) 10 MG capsule [id number] Yes Take 1 capsule (10 mg total) by mouth at bedtime. [name], DO Taking Active Friend, Pharmacy Records, Self  nystatin  (MYCOSTATIN ) 100000 UNIT/ML suspension [id number] No 5mL swish and spit TID  Patient not taking: Reported on 10/23/2023   [name], DO Not Taking Active    nystatin  (MYCOSTATIN /NYSTOP ) powder [id number] No Apply 1 Application topically 3 (three) times daily.  Patient not taking: Reported on 10/23/2023   [name], DO Not Taking Active   Omega-3 Fatty Acids (OMEGA 3 PO) [id number] Yes Take 1,500 mg by mouth 2 (two) times daily. [provider] Taking Active Friend, Pharmacy Records, Self  OZEMPIC , 1 MG/DOSE, 4 MG/3ML SOPN [id number] Yes Inject 1 mg into the skin once a week. On Mondays [provider] Taking Active Friend, Pharmacy Records, Self  potassium chloride  SA (KLOR-CON  M) 20 MEQ tablet [id number] Yes Take 20 mEq by mouth at bedtime. [provider] Taking Active Friend, Pharmacy Records, Self  rosuvastatin  (CRESTOR ) 40 MG tablet [id number] Yes Take 1 tablet (40 mg total) by mouth daily. Sammy Crisp, MD Taking Active Friend, Pharmacy Records, Self  Spacer/Aero-Holding Chambers (AEROCHAMBER MV) inhaler [id number] Yes Use as instructed Ethlyn Herd, MD Taking Active Friend, Pharmacy Records, Self  Spacer/Aero-Holding Idelle Majors [facility] [id number] Yes Use as directed Dx: COPD, J44.9 [name], DO Taking Active Friend, Pharmacy Records, Self  spironolactone  (ALDACTONE ) 25 MG tablet [id number] Yes Take 0.5 tablets (12.5 mg total) by mouth daily. Sammy Crisp, MD Taking Active Friend, Pharmacy Records, Self  torsemide  (DEMADEX ) 20 MG tablet [id number] Yes Take 2 tablets by mouth once daily  Gollan, Timothy J, MD Taking Active   TRELEGY ELLIPTA  100-62.5-25 MCG/ACT AEPB [id number] Yes Inhale 1 puff by mouth once daily [name], DO Taking Active Friend, Pharmacy Records, Self  VENTOLIN  HFA [phone] Base) MCG/ACT inhaler [id number] Yes INHALE 2 PUFFS BY MOUTH EVERY 6 HOURS AS NEEDED FOR WHEEZING OR SHORTNESS OF BREATH [name], [name], DO Taking Active Friend, Pharmacy Records, Self  warfarin (COUMADIN ) 1 MG tablet [id number] Yes Take 0.5 tablets (0.5 mg total) by mouth daily. [name], DO Taking Active             Med Note (LLOYD, CRYSTAL L   Wed Sep 19, 2023 11:36 AM) Reports 3mg  dose last night   warfarin (COUMADIN ) 3 MG tablet [id number] Yes Take 1 tablet (3 mg total) by mouth daily. [name], DO Taking Active            Med Note (Cerrone Debold M   Wed Oct 24, 2023 11:02 AM) Taking 1mg  by mouth  Med List Note (Hagopian, Tyelisha L, CPhT 08/27/23 1743): DO NOT REFILL MEDICATIONS WITHOUT AN APPOINTMENT / Friend Ms. [name] assists with his medication            Recommendation:   PCP Follow-up Home Health requests: Occupational Therapy Physical therapy Skilled Nursing  Follow Up Plan:   Telephone follow-up 2 weeks Zhuri Krass, LCSW [facility]  [facility] [place], [facility] Licensed Clinical Social Worker Care Coordinator  Direct Dial: [phone]

## 2023-11-05 NOTE — Telephone Encounter (Signed)
 Requested update from [employer] to see why patient would be out currently.

## 2023-11-06 ENCOUNTER — Encounter: Payer: Self-pay | Admitting: Pediatrics

## 2023-11-06 ENCOUNTER — Telehealth (INDEPENDENT_AMBULATORY_CARE_PROVIDER_SITE_OTHER): Admitting: Pediatrics

## 2023-11-06 ENCOUNTER — Telehealth: Payer: Self-pay | Admitting: *Deleted

## 2023-11-06 VITALS — BP 141/85 | HR 68 | Temp 97.6°F | Wt 263.0 lb

## 2023-11-06 DIAGNOSIS — J432 Centrilobular emphysema: Secondary | ICD-10-CM

## 2023-11-06 DIAGNOSIS — I5022 Chronic systolic (congestive) heart failure: Secondary | ICD-10-CM | POA: Diagnosis not present

## 2023-11-06 DIAGNOSIS — Z952 Presence of prosthetic heart valve: Secondary | ICD-10-CM

## 2023-11-06 NOTE — Progress Notes (Signed)
 Complex Care Management Note  Care Guide Note 11/06/2023 Name: Roberto [name] MRN: [id number] DOB: 1955/09/20  Roberto [name] is a 68 y.o. year old male who sees [name], DO for primary care. I reached out to [name] by phone today to offer complex care management services.  [name] was given information about Complex Care Management services today including:   The Complex Care Management services include support from the care team which includes your Nurse Care Manager, Clinical Social Worker, or Pharmacist.  The Complex Care Management team is here to help remove barriers to the health concerns and goals most important to you. Complex Care Management services are voluntary, and the patient may decline or stop services at any time by request to their care team member.   Complex Care Management Consent Status: Patient agreed to services and verbal consent obtained.   Follow up plan:  Telephone appointment with complex care management team member scheduled for:  11/08/2023  Encounter Outcome:  Patient Scheduled  Kandis Ormond, CMA [place]  [facility], [facility] Guide Direct Dial: [phone]  Fax: [phone] Website: [email or web address]

## 2023-11-06 NOTE — Telephone Encounter (Signed)
 Spoke with [employer] and provided details about situation. Patients can only test once weekly using the machine. If any additional testing is ordered by provided it must be completed in office versus at home on the machine. Camilo Cella, Healthcare Specialist is going to reach out to her contact with [facility] [employer] to see how about getting additional supplies out to patient. After a sort while she returned the call to state this since patient has not tested using the machine since 09/25/2023 we need to provide that information to [place]. I then called back to [facility] and she provided the missing weeks numbers. We also dicussed how he can remain compliant. Must test only once weekly and call those into the company using the 800# and personal pin #. Returned call to Camilo Cella and she will set patient back up for additional home training which will allow a new supply to be sent out for patient.

## 2023-11-06 NOTE — Telephone Encounter (Signed)
 Received call from [name] (the patient's friend) requesting an update on the patient's referral to see a pulmonologist due to the patient experiencing swelling. The caller stated that she contacted Dr. Antoinette Batman office to check on the status of an appointment and was informed that no referral had been received. I explained to the caller that the referral was submitted on 10/25/23 and that I would follow up with Dr. Antoinette Batman office to confirm whether the referral needs to be resubmitted. The caller also mentioned that the patient is out of INR strips and has been unable to test for the past two weeks due to the inability to obtain new strips.  Caller requested that Katie Lumpkins, CMA be informed regarding the lack of strips so she is able to assist.   I informed the caller that I would have  all concerns addressed and return her call with a status today.

## 2023-11-06 NOTE — Progress Notes (Signed)
 Telehealth Visit  I connected with  Roberto [name] on 11/09/23 by a video enabled telemedicine application and verified that I am speaking with the correct person using two identifiers.   I discussed the limitations of evaluation and management by telemedicine. The patient expressed understanding and agreed to proceed.  Subjective:    Patient ID: Roberto [name], male    DOB: [birth year], 68 y.o.   MRN: [id number]  HPI: Roberto [name] is a 68 y.o. male  No chief complaint on file.   Discussed the use of AI scribe software for clinical note transcription with the patient, who gave verbal consent to proceed.  History of Present Illness   Roberto [name] is a 67 year old male with COPD who presents with a chronic cough.  He has had a chronic cough since December 24th, described as rattling but non-productive. No new rattle in his lungs. He is currently trying to see a pulmonologist for further evaluation.  He has experienced significant weight gain, currently weighing 263 pounds, which he attributes to his diet. His caregiver suggests it may be related to fluid retention. He is taking torsemide  and amiodarone , but not furosemide . There is concern about potential fluid accumulation in his abdomen, described as a 'fifty pound bucket in the front.' No swelling, particularly around the ankles, is noted.  He is on Ozempic , with a current prescription for 1 mg, and there is a possibility of increasing the dose to 2 mg. There has been no gap in his medication use. He is also preparing for an upcoming colonoscopy in June and has been prescribed Lovenox  for this procedure.  He experiences tremors, which have worsened since a hospital stay in March. He has had tremors for years, but they became significantly worse after hospitalization. He performs INR testing twice a week due to his amiodarone  use, but there are issues with the supply of testing strips, as he requires 14 strips per week but only  receives 7.      Relevant past medical, surgical, family and social history reviewed and updated as indicated. Interim medical history since our last visit reviewed. Allergies and medications reviewed and updated.  ROS per HPI unless specifically indicated above     Objective:     BP (!) 141/85 Comment: Patient obtained  Pulse 68   Temp 97.6 F (36.4 C) (Axillary)   Wt 263 lb (119.3 kg)   SpO2 96%   BMI 42.45 kg/m   Wt Readings from Last 3 Encounters:  11/06/23 263 lb (119.3 kg)  10/24/23 255 lb (115.7 kg)  10/23/23 255 lb (115.7 kg)     Physical Exam Constitutional:      General: He is not in acute distress.    Appearance: He is normal weight. He is not ill-appearing.  Pulmonary:     Effort: Pulmonary effort is normal.  Neurological:     General: No focal deficit present.     Mental Status: He is alert. Mental status is at baseline.  Psychiatric:        Mood and Affect: Mood normal.        Behavior: Behavior normal.      LIMITED EXAM GIVEN VIDEO VISIT     Assessment & Plan:  Assessment & Plan   H/O mechanical aortic valve replacement Due for repeat INR, last checked 2mg . Variable dosing schedule. Goal 2.5-3.5. -     CoaguChek XS/INR Waived; Future  Centrilobular emphysema (HCC) Chronic cough since December 24th with rattling sensation. Pulmonology referral  pending. - Expedite pulmonology referral appointment scheduling.  Chronic HFrEF (heart failure with reduced ejection fraction) (HCC) 8lbs weight gain attributed to dietary intake though may have some degree of fluid retention. Current diuretic torsemide . Unable to exam due to video visit but will check BNP and determine need for diuresis.  - Discuss weight management strategies. - Confirm Ozempic  prescription with pharmacy. -     Brain natriuretic peptide; Future -     Basic metabolic panel with GFR; Future   Follow up plan: Return in about 1 week (around 11/13/2023) for Chronic illness f/u.  Hadassah Letters, MD   This visit was completed via video visit through MyChart due to the restrictions of the COVID-19 pandemic. All issues as above were discussed and addressed. Physical exam was done as above through visual confirmation on video through MyChart. If it was felt that the patient should be evaluated in the office, they were directed there. The patient verbally consented to this visit."} Location of the patient: home Location of the provider: work Those involved with this call:  Provider: Geraldine Kling, MD CMA: Dana Duncan Lumpkins Time spent on call: 15 minutes with patient face to face via video conference. More than 50% of this time was spent in counseling and coordination of care. 15 minutes total spent in review of patient's record and preparation of their chart. Total time spent on this encounter: 30 minutes.

## 2023-11-07 ENCOUNTER — Other Ambulatory Visit

## 2023-11-07 DIAGNOSIS — I5022 Chronic systolic (congestive) heart failure: Secondary | ICD-10-CM

## 2023-11-07 DIAGNOSIS — Z952 Presence of prosthetic heart valve: Secondary | ICD-10-CM

## 2023-11-07 LAB — COAGUCHEK XS/INR WAIVED
INR: 1.2 — ABNORMAL HIGH (ref 0.9–1.1)
Prothrombin Time: 13.9 s

## 2023-11-08 ENCOUNTER — Other Ambulatory Visit: Payer: Self-pay

## 2023-11-08 NOTE — Patient Outreach (Signed)
 Complex Care Management   Visit Note  11/08/2023  Name:  Kendan Yokoyama MRN: [id number] DOB: April 04, 1956  Situation: Referral received for Complex Care Management related to SDOH Barriers:  Lack of essential utilities   I obtained verbal consent from Patient Friend.  Visit completed with patient  on the phone  Background:   Past Medical History:  Diagnosis Date   Aneurysm (HCC)    Bicuspid aortic valve    a. s/p #27 Carbomedics mechanical valve on 03/25/2010; b. on Coumadin ; c. TTE 12/17: EF 40-45%, moderately dilated LV with moderate LVH, AVR well-seated with 14 mmHg gradient, peak AV velocity 2.5 m/s, mild mitral valve thickening with mild MR, mildly dilated RV with mildly reduced contraction   Cellulitis    CHF (congestive heart failure) (HCC)    Chronic kidney disease    Chronic systolic CHF (congestive heart failure) (HCC)    a. R/LHC 03/2010 showed no significant CAD, LVEDP 31 mmHg, mean AoV gradient 34 mmHg at rest and 47 mmHg with dobutamine 20 mcg/kg/min, AVA 1.0 cm^2, RA 31, RV 68/25, PA 68/47, PCWP 38. PA sat 65%. CO 6.2 L/min (Fick) and 5.3 L/min (thermodilution)   Clotting disorder (HCC)    COPD (chronic obstructive pulmonary disease) (HCC)    H/O mechanical aortic valve replacement 03/25/2010   a. #27 Carbomedics mechanical valve   Hearing loss    High cholesterol    HTN (hypertension)    Hypercholesterolemia    Renal infarct [facility]) 2017   Multiple right renal infarcts, likely embolic.   Stage 3 chronic kidney disease (HCC)    Stroke [facility])    TIA (transient ischemic attack) 05/2014    Assessment: SW completed a telephone outreach with patient and friend. They state they did have a cut off notice for today, but they were able to work with [employer] and got an extension. They have tried all resources SW tried to provide, all resources stating that they did not have any funds. Patient is a Cytogeneticist, SW provided telephone number for [employer] to possibility assist with  utility bill.    Recommendation:   No recommendations at this time.  Follow Up Plan:   Telephone follow-up on 11/19/23 at 10:30am  Valora Gear, BSW, MHA [place]  [facility] Social Worker, Population Health [phone]

## 2023-11-08 NOTE — Progress Notes (Signed)
 Reviewed with [name], care giver and she gave a 7 mg dose and was going to give him a 7 mg again tonight.

## 2023-11-08 NOTE — Patient Instructions (Signed)
 Visit Information  Thank you for taking time to visit with me today. Please don't hesitate to contact me if I can be of assistance to you before our next scheduled appointment.  Our next appointment is by telephone on 11/19/23 at 11:30 Please call the care guide team at [phone] if you need to cancel or reschedule your appointment.   Following is a copy of your care plan:   Goals Addressed   None     Please call the Suicide and Crisis Lifeline: 988 call the [place]  National Suicide Prevention Lifeline: 3860857554 or TTY: 629-317-5126 TTY (470)367-9622) to talk to a trained counselor call 1-800-273-TALK (toll free, 24 hour hotline) call 911 if you are experiencing a Mental Health or Behavioral Health Crisis or need someone to talk to.  Patient verbalizes understanding of instructions and care plan provided today and agrees to view in MyChart. Active MyChart status and patient understanding of how to access instructions and care plan via MyChart confirmed with patient.     Valora Gear, Florestine Hurl, MHA [place]  [facility] Social Worker, Population Health [phone]

## 2023-11-09 ENCOUNTER — Encounter: Payer: Self-pay | Admitting: Pediatrics

## 2023-11-09 ENCOUNTER — Other Ambulatory Visit: Payer: Self-pay | Admitting: Family Medicine

## 2023-11-09 LAB — BASIC METABOLIC PANEL WITH GFR
BUN/Creatinine Ratio: 17 (ref 10–24)
BUN: 28 mg/dL — ABNORMAL HIGH (ref 8–27)
CO2: 22 mmol/L (ref 20–29)
Calcium: 9.8 mg/dL (ref 8.6–10.2)
Chloride: 101 mmol/L (ref 96–106)
Creatinine, Ser: 1.67 mg/dL — ABNORMAL HIGH (ref 0.76–1.27)
Glucose: 104 mg/dL — ABNORMAL HIGH (ref 70–99)
Potassium: 4 mmol/L (ref 3.5–5.2)
Sodium: 141 mmol/L (ref 134–144)
eGFR: 44 mL/min/{1.73_m2} — ABNORMAL LOW (ref >59)

## 2023-11-09 LAB — BRAIN NATRIURETIC PEPTIDE: BNP: 192.6 pg/mL — ABNORMAL HIGH (ref 0.0–100.0)

## 2023-11-09 NOTE — Progress Notes (Signed)
 Remote ICD transmission.

## 2023-11-09 NOTE — Patient Instructions (Signed)
 I will reach out to your specialists  Please stop by to draw INR

## 2023-11-12 ENCOUNTER — Encounter: Payer: Self-pay | Admitting: Nurse Practitioner

## 2023-11-12 ENCOUNTER — Other Ambulatory Visit

## 2023-11-12 ENCOUNTER — Ambulatory Visit: Admitting: Nurse Practitioner

## 2023-11-12 VITALS — BP 101/62 | HR 76 | Temp 98.8°F | Resp 18 | Ht 65.98 in | Wt 266.2 lb

## 2023-11-12 DIAGNOSIS — S61211A Laceration without foreign body of left index finger without damage to nail, initial encounter: Secondary | ICD-10-CM

## 2023-11-12 DIAGNOSIS — Z952 Presence of prosthetic heart valve: Secondary | ICD-10-CM | POA: Diagnosis not present

## 2023-11-12 LAB — COAGUCHEK XS/INR WAIVED
INR: 1.9 — ABNORMAL HIGH (ref 0.9–1.1)
Prothrombin Time: 22.9 s

## 2023-11-12 MED ORDER — SULFAMETHOXAZOLE-TRIMETHOPRIM 800-160 MG PO TABS: 1.0000 | ORAL_TABLET | Freq: Two times a day (BID) | ORAL | 0 refills | Status: DC

## 2023-11-12 NOTE — Progress Notes (Signed)
 BP 101/62 (BP Location: Left Arm, Patient Position: Sitting, Cuff Size: Large)   Pulse 76   Temp 98.8 F (37.1 C) (Oral)   Resp 18   Ht 5' 5.98" (1.676 m)   Wt 266 lb 3.2 oz (120.7 kg)   SpO2 97%   BMI 42.99 kg/m    Subjective:    Patient ID: Roberto [name], male    DOB: Jul 05, 1955, 68 y.o.   MRN: [id number]  HPI: Roberto [name] is a 68 y.o. male  Chief Complaint  Patient presents with   Hand Pain    Left 2nd finger. Injured on Saturday on a metal fence.     Patient was doing some work outside on Saturday and got his finger caught on the fence and tore the skin.  He is on coumadin  so it did bleed for two days.    Coumadin  Management.  The expected duration of coumadin  treatment is lifelong The reason for anticoagulation is  mechanical heart valve. Currently taking 3mg  daily.  Last INR on 5/7 was 1.2 Present Coumadin  dose: Goal: 2.5-3.5  Excessive bruising: no Nose bleeding: no Rectal bleeding: no Prolonged menstrual cycles: no Eating diet with consistent amounts of foods containing Vitamin K:no Any recent antibiotic use? no   Relevant past medical, surgical, family and social history reviewed and updated as indicated. Interim medical history since our last visit reviewed. Allergies and medications reviewed and updated.  Review of Systems  HENT:  Negative for nosebleeds.   Gastrointestinal:        Denies rectal bleeding  Skin:        Cut on finger    Per HPI unless specifically indicated above     Objective:     BP 101/62 (BP Location: Left Arm, Patient Position: Sitting, Cuff Size: Large)   Pulse 76   Temp 98.8 F (37.1 C) (Oral)   Resp 18   Ht 5' 5.98" (1.676 m)   Wt 266 lb 3.2 oz (120.7 kg)   SpO2 97%   BMI 42.99 kg/m   Wt Readings from Last 3 Encounters:  11/12/23 266 lb 3.2 oz (120.7 kg)  11/06/23 263 lb (119.3 kg)  10/24/23 255 lb (115.7 kg)    Physical Exam Vitals and nursing note reviewed.  Constitutional:      General: He is not in  acute distress.    Appearance: Normal appearance. He is not ill-appearing, toxic-appearing or diaphoretic.  HENT:     Head: Normocephalic.     Right Ear: External ear normal.     Left Ear: External ear normal.     Nose: Nose normal. No congestion or rhinorrhea.     Mouth/Throat:     Mouth: Mucous membranes are moist.  Eyes:     General:        Right eye: No discharge.        Left eye: No discharge.     Extraocular Movements: Extraocular movements intact.     Conjunctiva/sclera: Conjunctivae normal.     Pupils: Pupils are equal, round, and reactive to light.  Cardiovascular:     Rate and Rhythm: Normal rate and regular rhythm.     Heart sounds: No murmur heard. Pulmonary:     Effort: Pulmonary effort is normal. No respiratory distress.     Breath sounds: Normal breath sounds. No wheezing, rhonchi or rales.  Abdominal:     General: Abdomen is flat. Bowel sounds are normal.  Musculoskeletal:     Cervical back: Normal range of motion  and neck supple.  Skin:    General: Skin is warm and dry.     Capillary Refill: Capillary refill takes less than 2 seconds.     Comments: Dime sized cut on left index finger. Redness around the area. No drainage or current signs of infection.  Neurological:     General: No focal deficit present.     Mental Status: He is alert and oriented to person, place, and time.  Psychiatric:        Mood and Affect: Mood normal.        Behavior: Behavior normal.        Thought Content: Thought content normal.        Judgment: Judgment normal.     Results for orders placed or performed in visit on 11/07/23  CoaguChek XS/INR Waived (STAT)   Collection Time: 11/07/23  8:34 AM  Result Value Ref Range   INR 1.2 (H) 0.9 - 1.1   Prothrombin Time 13.9 sec  Basic Metabolic Panel (BMET)   Collection Time: 11/07/23  8:35 AM  Result Value Ref Range   Glucose 104 (H) 70 - 99 mg/dL   BUN 28 (H) 8 - 27 mg/dL   Creatinine, Ser 0.98 (H) 0.76 - 1.27 mg/dL   eGFR 44 (L)  >11 BJ/YNW/2.95   BUN/Creatinine Ratio 17 10 - 24   Sodium 141 134 - 144 mmol/L   Potassium 4.0 3.5 - 5.2 mmol/L   Chloride 101 96 - 106 mmol/L   CO2 22 20 - 29 mmol/L   Calcium  9.8 8.6 - 10.2 mg/dL  B Nat Peptide   Collection Time: 11/07/23  8:35 AM  Result Value Ref Range   BNP 192.6 (H) 0.0 - 100.0 pg/mL   *Note: Due to a large number of results and/or encounters for the requested time period, some results have not been displayed. A complete set of results can be found in Results Review.      Assessment & Plan:   Problem List Items Addressed This Visit       Other   H/O mechanical aortic valve replacement - Primary   Chronic.  INR 1.9 in office today.  Continue with 3mg  dose due to starting Bactrim.  Follow up in Thursday for repeat INR.      Relevant Orders   CoaguChek XS/INR Waived (STAT)   Other Visit Diagnoses       Laceration of left index finger without foreign body without damage to nail, initial encounter       Due to patient's complex medical history, will treat with antibiotics. Patient remains sub therapeutic on INR. Will treat with bactrim.  INR subtherapeutic at        Follow up plan: Return for Follow up on Thursday with Dr. Lincoln Renshaw- okay to double book.

## 2023-11-12 NOTE — Telephone Encounter (Signed)
 Requested Prescriptions  Pending Prescriptions Disp Refills   nortriptyline  (PAMELOR ) 10 MG capsule [Pharmacy Med Name: Nortriptyline  HCl 10 MG Oral Capsule] 90 capsule 0    Sig: Take 1 capsule by mouth at bedtime     Psychiatry:  Antidepressants - Heterocyclics (TCAs) Passed - 11/12/2023 12:15 PM      Passed - Completed PHQ-2 or PHQ-9 in the last 360 days      Passed - Valid encounter within last 6 months    Recent Outpatient Visits           Today H/O mechanical aortic valve replacement   [facility] Aileen Alexanders, NP   6 days ago H/O mechanical aortic valve replacement   [facility] Hadassah Letters, MD   1 month ago H/O mechanical aortic valve replacement   [place] [facility] [place], [name], DO   1 month ago H/O mechanical aortic valve replacement   [facility] [place], [name], DO   1 month ago H/O mechanical aortic valve replacement   [facility] [place], [place], DO       Future Appointments             Tomorrow Ronald Cockayne, NP [facility]

## 2023-11-12 NOTE — Assessment & Plan Note (Signed)
 Chronic.  INR 1.9 in office today.  Continue with 3mg  dose due to starting Bactrim.  Follow up in Thursday for repeat INR.

## 2023-11-13 ENCOUNTER — Ambulatory Visit: Admitting: Cardiology

## 2023-11-13 ENCOUNTER — Ambulatory Visit: Payer: Self-pay | Admitting: Family Medicine

## 2023-11-15 ENCOUNTER — Encounter: Payer: Self-pay | Admitting: Family Medicine

## 2023-11-15 ENCOUNTER — Ambulatory Visit (INDEPENDENT_AMBULATORY_CARE_PROVIDER_SITE_OTHER): Admitting: Family Medicine

## 2023-11-15 VITALS — BP 105/70 | HR 72 | Ht 66.0 in | Wt 269.0 lb

## 2023-11-15 DIAGNOSIS — R062 Wheezing: Secondary | ICD-10-CM

## 2023-11-15 DIAGNOSIS — I5022 Chronic systolic (congestive) heart failure: Secondary | ICD-10-CM

## 2023-11-15 DIAGNOSIS — R0602 Shortness of breath: Secondary | ICD-10-CM

## 2023-11-15 DIAGNOSIS — Z952 Presence of prosthetic heart valve: Secondary | ICD-10-CM

## 2023-11-15 DIAGNOSIS — F4310 Post-traumatic stress disorder, unspecified: Secondary | ICD-10-CM

## 2023-11-15 LAB — COAGUCHEK XS/INR WAIVED
INR: 1.7 — ABNORMAL HIGH (ref 0.9–1.1)
Prothrombin Time: 19.9 s

## 2023-11-15 MED ORDER — ALBUTEROL SULFATE (2.5 MG/3ML) 0.083% IN NEBU
2.5000 mg | INHALATION_SOLUTION | Freq: Once | RESPIRATORY_TRACT | Status: DC
Start: 2023-11-15 — End: 2024-05-02

## 2023-11-15 MED ORDER — WARFARIN SODIUM 1 MG PO TABS: 1.0000 mg | ORAL_TABLET | Freq: Every day | ORAL | 3 refills | Status: DC

## 2023-11-15 MED ORDER — NYSTATIN 100000 UNIT/ML MT SUSP: OROMUCOSAL | 1 refills | Status: DC

## 2023-11-15 NOTE — Assessment & Plan Note (Signed)
 In exacerbation with rales and previous bump in BNP- will increase his torsemide  to 60mg  daily for 5 days. Due to follow up with cardiology tomorrow. Will get him back on Tuesday for recheck. Call with any concerns.

## 2023-11-15 NOTE — Progress Notes (Signed)
 BP 105/70 (BP Location: Left Arm, Patient Position: Sitting, Cuff Size: Large)   Pulse 72   Ht 5\' 6"  (1.676 m)   Wt 269 lb (122 kg)   SpO2 100%   BMI 43.42 kg/m    Subjective:    Patient ID: Roberto [name], male    DOB: 1956/01/27, 68 y.o.   MRN: [id number]  HPI: Roberto [name] is a 68 y.o. male  Chief Complaint  Patient presents with   Coagulation Disorder   Thrush    Pt has concerns of having possible thrush.    Shortness of Breath   Coumadin  Management.  The expected duration of coumadin  treatment is lifelong The reason for anticoagulation is  mechanical heart valve.  Present Coumadin  dose: 3mg  daily Goal: 2.5-3.5  Excessive bruising: no Nose bleeding: no Rectal bleeding: no Prolonged menstrual cycles: N/A Eating diet with consistent amounts of foods containing Vitamin K:yes Any recent antibiotic use? yes  Laceration on his hand is healing well. No redness or swelling.   ANXIETY/STRESS- has been sleep walking and talking in his sleep about things he saw in the [employer]. Has been significantly more anxious.  Duration: weeks to months Status:uncontrolled Anxious mood: yes  Excessive worrying: no Irritability: no  Sweating: no Nausea: no Palpitations:no Hyperventilation: no Panic attacks: no Agoraphobia: no  Obscessions/compulsions: no Depressed mood: yes    11/15/2023    8:14 AM 11/12/2023    8:13 AM 11/06/2023   10:37 AM 10/24/2023   11:09 AM 10/19/2023    4:00 PM  Depression screen PHQ 2/9  Decreased Interest 3 0 0 0 0  Down, Depressed, Hopeless 2 2 0 1 1  PHQ - 2 Score 5 2 0 1 1  Altered sleeping 3 2 0    Tired, decreased energy 2 2 0    Change in appetite 2 2 0    Feeling bad or failure about yourself  3 2 0    Trouble concentrating 3 3 0    Moving slowly or fidgety/restless 3 2 0    Suicidal thoughts 2 2 0    PHQ-9 Score 23 17 0    Difficult doing work/chores Very difficult Very difficult Not difficult at all     Anhedonia: no Weight  changes: yes Insomnia: no   Hypersomnia: yes Fatigue/loss of energy: yes Feelings of worthlessness: no Feelings of guilt: no Impaired concentration/indecisiveness: no Suicidal ideations: no  Crying spells: no Recent Stressors/Life Changes: no   Relationship problems: no   Family stress: no     Financial stress: no    Job stress: no    Recent death/loss: no  SHORTNESS OF BREATH Duration: weeks Onset: gradual Description of breathing discomfort: hard to catch a breath Severity: moderate Episode duration: constant for the past couple of weeks Frequency: constant Related to exertion: yes Cough: yes non-productive Chest tightness: yes Wheezing: yes Fevers: no Chest pain: no Palpitations: no  Nausea: no Diaphoresis: no Deconditioning: yes Status: worse Treatments attempted: inhalers   Relevant past medical, surgical, family and social history reviewed and updated as indicated. Interim medical history since our last visit reviewed. Allergies and medications reviewed and updated.  Review of Systems  Constitutional:  Positive for fatigue and unexpected weight change. Negative for activity change, appetite change, chills, diaphoresis and fever.  Respiratory:  Positive for cough, shortness of breath and wheezing. Negative for apnea, choking, chest tightness and stridor.   Cardiovascular: Negative.   Genitourinary: Negative.   Musculoskeletal: Negative.   Skin: Negative.  Neurological: Negative.   Hematological: Negative.   Psychiatric/Behavioral:  Positive for behavioral problems and sleep disturbance. Negative for agitation, confusion, decreased concentration, dysphoric mood, hallucinations, self-injury and suicidal ideas. The patient is not nervous/anxious and is not hyperactive.     Per HPI unless specifically indicated above     Objective:     BP 105/70 (BP Location: Left Arm, Patient Position: Sitting, Cuff Size: Large)   Pulse 72   Ht 5\' 6"  (1.676 m)   Wt 269  lb (122 kg)   SpO2 100%   BMI 43.42 kg/m   Wt Readings from Last 3 Encounters:  11/15/23 269 lb (122 kg)  11/12/23 266 lb 3.2 oz (120.7 kg)  11/06/23 263 lb (119.3 kg)    Physical Exam Vitals and nursing note reviewed.  Constitutional:      General: He is not in acute distress.    Appearance: Normal appearance. He is well-developed. He is obese. He is not ill-appearing, toxic-appearing or diaphoretic.  HENT:     Head: Normocephalic and atraumatic.     Right Ear: External ear normal.     Left Ear: External ear normal.     Nose: Nose normal.     Mouth/Throat:     Mouth: Mucous membranes are moist.     Pharynx: Oropharynx is clear.  Eyes:     General: No scleral icterus.       Right eye: No discharge.        Left eye: No discharge.     Extraocular Movements: Extraocular movements intact.     Conjunctiva/sclera: Conjunctivae normal.     Pupils: Pupils are equal, round, and reactive to light.  Cardiovascular:     Rate and Rhythm: Normal rate and regular rhythm.     Pulses: Normal pulses.     Heart sounds: Normal heart sounds. No murmur heard.    No friction rub. No gallop.  Pulmonary:     Effort: Pulmonary effort is normal. No respiratory distress.     Breath sounds: No stridor. Examination of the right-upper field reveals wheezing. Examination of the left-upper field reveals wheezing. Examination of the right-lower field reveals rales. Examination of the left-lower field reveals rales. Wheezing (resolved following nebulizer) and rales present. No rhonchi.  Chest:     Chest wall: No tenderness.  Musculoskeletal:        General: Normal range of motion.     Cervical back: Normal range of motion and neck supple.  Skin:    General: Skin is warm and dry.     Capillary Refill: Capillary refill takes less than 2 seconds.     Coloration: Skin is not jaundiced or pale.     Findings: No bruising, erythema, lesion or rash.  Neurological:     General: No focal deficit present.      Mental Status: He is alert and oriented to person, place, and time. Mental status is at baseline.  Psychiatric:        Mood and Affect: Mood normal.        Behavior: Behavior normal.        Thought Content: Thought content normal.        Judgment: Judgment normal.     Results for orders placed or performed in visit on 11/15/23  CoaguChek XS/INR Waived   Collection Time: 11/15/23  8:20 AM  Result Value Ref Range   INR 1.7 (H) 0.9 - 1.1   Prothrombin Time 19.9 sec   *Note: Due to a large  number of results and/or encounters for the requested time period, some results have not been displayed. A complete set of results can be found in Results Review.      Assessment & Plan:   Problem List Items Addressed This Visit       Cardiovascular and Mediastinum   Chronic HFrEF (heart failure with reduced ejection fraction) (HCC)   In exacerbation with rales and previous bump in BNP- will increase his torsemide  to 60mg  daily for 5 days. Due to follow up with cardiology tomorrow. Will get him back on Tuesday for recheck. Call with any concerns.       Relevant Medications   warfarin (COUMADIN ) 1 MG tablet   Other Relevant Orders   CBC with Differential/Platelet   Basic metabolic panel with GFR     Other   H/O mechanical aortic valve replacement - Primary   Subtherapeutic at 1.7- will increase him to 4mg  coumadin  and recheck in 5 days. To have colonoscopy in 4 weeks- needs to be switched to lovenox  in 3 weeks due to history of renal infarct off coumadin . Will plan to switch over 12/06/23.      Relevant Orders   CoaguChek XS/INR Waived (Completed)   PTSD (post-traumatic stress disorder)   Encouraged reaching out to the [place] regarding counselling. We will start prazosin to help with nightmares, but will wait until after his higher dose of torsemide  to avoid hypotension risk.      Other Visit Diagnoses       SOB (shortness of breath)       Likely due to CHF. Has referral in for pulmonology.  Call with any concerns.   Relevant Orders   CBC with Differential/Platelet   Basic metabolic panel with GFR     Wheezing       Resolved following nebulizer treatment.   Relevant Medications   albuterol  (PROVENTIL ) (2.5 MG/3ML) 0.083% nebulizer solution 2.5 mg        Follow up plan: Return Tuesday- OK to double book if we have to.  >40 minutes spent with patient and caregiver today

## 2023-11-15 NOTE — Patient Instructions (Addendum)
 Take 60mg  of torsemide  for the next 5 days Take 4mg  of coumadin  daily until you see me

## 2023-11-15 NOTE — Assessment & Plan Note (Signed)
 Subtherapeutic at 1.7- will increase him to 4mg  coumadin  and recheck in 5 days. To have colonoscopy in 4 weeks- needs to be switched to lovenox  in 3 weeks due to history of renal infarct off coumadin . Will plan to switch over 12/06/23.

## 2023-11-15 NOTE — Assessment & Plan Note (Signed)
 Encouraged reaching out to the [place] regarding counselling. We will start prazosin to help with nightmares, but will wait until after his higher dose of torsemide  to avoid hypotension risk.

## 2023-11-16 ENCOUNTER — Encounter: Payer: Self-pay | Admitting: Medical

## 2023-11-16 ENCOUNTER — Ambulatory Visit: Attending: Medical | Admitting: Medical

## 2023-11-16 ENCOUNTER — Ambulatory Visit: Payer: Self-pay | Admitting: Family Medicine

## 2023-11-16 VITALS — BP 106/72 | HR 62 | Resp 16 | Ht 66.0 in | Wt 269.8 lb

## 2023-11-16 DIAGNOSIS — I5022 Chronic systolic (congestive) heart failure: Secondary | ICD-10-CM

## 2023-11-16 DIAGNOSIS — I428 Other cardiomyopathies: Secondary | ICD-10-CM | POA: Diagnosis not present

## 2023-11-16 DIAGNOSIS — I480 Paroxysmal atrial fibrillation: Secondary | ICD-10-CM

## 2023-11-16 DIAGNOSIS — Z952 Presence of prosthetic heart valve: Secondary | ICD-10-CM

## 2023-11-16 DIAGNOSIS — Z95810 Presence of automatic (implantable) cardiac defibrillator: Secondary | ICD-10-CM | POA: Diagnosis not present

## 2023-11-16 DIAGNOSIS — I2510 Atherosclerotic heart disease of native coronary artery without angina pectoris: Secondary | ICD-10-CM

## 2023-11-16 DIAGNOSIS — I7121 Aneurysm of the ascending aorta, without rupture: Secondary | ICD-10-CM

## 2023-11-16 DIAGNOSIS — I472 Ventricular tachycardia, unspecified: Secondary | ICD-10-CM

## 2023-11-16 DIAGNOSIS — E782 Mixed hyperlipidemia: Secondary | ICD-10-CM

## 2023-11-16 LAB — BASIC METABOLIC PANEL WITH GFR
BUN/Creatinine Ratio: 14 (ref 10–24)
BUN: 27 mg/dL (ref 8–27)
CO2: 21 mmol/L (ref 20–29)
Calcium: 9.5 mg/dL (ref 8.6–10.2)
Chloride: 98 mmol/L (ref 96–106)
Creatinine, Ser: 1.88 mg/dL — ABNORMAL HIGH (ref 0.76–1.27)
Glucose: 91 mg/dL (ref 70–99)
Potassium: 4.3 mmol/L (ref 3.5–5.2)
Sodium: 137 mmol/L (ref 134–144)
eGFR: 38 mL/min/{1.73_m2} — ABNORMAL LOW (ref >59)

## 2023-11-16 LAB — CBC WITH DIFFERENTIAL/PLATELET
Basophils Absolute: 0.1 10*3/uL (ref 0.0–0.2)
Basos: 1 %
EOS (ABSOLUTE): 0.4 10*3/uL (ref 0.0–0.4)
Eos: 5 %
Hematocrit: 46.8 % (ref 37.5–51.0)
Hemoglobin: 14.9 g/dL (ref 13.0–17.7)
Immature Grans (Abs): 0 10*3/uL (ref 0.0–0.1)
Immature Granulocytes: 0 %
Lymphocytes Absolute: 1.2 10*3/uL (ref 0.7–3.1)
Lymphs: 16 %
MCH: 29 pg (ref 26.6–33.0)
MCHC: 31.8 g/dL (ref 31.5–35.7)
MCV: 91 fL (ref 79–97)
Monocytes Absolute: 0.9 10*3/uL (ref 0.1–0.9)
Monocytes: 11 %
Neutrophils Absolute: 5.2 10*3/uL (ref 1.4–7.0)
Neutrophils: 67 %
Platelets: 238 10*3/uL (ref 150–450)
RBC: 5.13 x10E6/uL (ref 4.14–5.80)
RDW: 16 % — ABNORMAL HIGH (ref 11.6–15.4)
WBC: 7.8 10*3/uL (ref 3.4–10.8)

## 2023-11-16 NOTE — Telephone Encounter (Signed)
 Just following up post Roberto [name]'s visit.  Can he hold his Warfarin 5 days prior to his procedure.

## 2023-11-16 NOTE — Progress Notes (Signed)
 Cardiology Office Note:  .   Date:  11/16/2023  ID:  Roberto [name], DOB 01/20/1956, MRN [id number] PCP: [name], DO  [facility] Providers Cardiologist:  Sammy Crisp, MD Electrophysiologist:  Manya Sells, MD  Sleep Medicine:  Gaylyn Keas, MD     History of Present Illness: .   Roberto [name] is a 68 y.o. male with a h/o aortic stenosis s/p mechanical AVR, large thoracic aortic aneurysm followed by cardiac surgery, chronic systolic heart failure and nonobstructive CAD presenting for follow-up.   His cardiac history dates back to September 2011, when he underwent mechanical aortic valve replacement, has been on warfarin since that time.  Echocardiogram in 2020 showed reduction in EF to 40 to 45% and dilation of the ascending aorta measuring 4.6 cm.  2022 he underwent left heart cath which showed 60% stenosis of the large diagonal branch but otherwise only mild disease of the mid LAD and proximal RCA.  In 2020 repeat echo showed further reduction in EF of 35 to 40%.  Presented to [facility] in December 2024 with monomorphic VT that resolved with IV amiodarone .  Repeat EKG showed A-fib with PVCs.  Left heart cath showed stable nonobstructive CAD, right heart cath showed cardiac index that was severely reduced with severely elevated filling pressures.  He was briefly placed on inotropes and aggressively diuresed.  He was admitted in February 2025 with A-fib and brief episodes of VT requiring ATP.  He was cardioverted and is feeling good since then.  He has been following with heart failure team, EP and general cardiology.  He was last seen 10/10/23 by Dr. Nolan Battle and was feeling fairly well with occasional lightheadedness.  Blood pressure limited GDMT.  Today, the patient reports significant weight gain of 15lbs. He says he was down to 247lbs at the lowest and is 269lbs today. Feels swelling in the stomach. Also has some wheezing and coughing. Patient feels stomach is swollen. No lower  leg edema. He feels breathing is getting worse. He was taking torsemide  40mg  daily, but PCP increased it to 60mg  daily due to BNP 192.  273lbs 08/29/23 257lbs 09/04/23 258lbs 10/10/23 269lbs 11/16/23   Studies Reviewed: .        Echo 10/2023 1. Left ventricular ejection fraction, by estimation, is 25 to 30%. Left  ventricular ejection fraction by 3D volume is 28 %. The left ventricle has  severely decreased function. The left ventricle demonstrates global  hypokinesis. The left ventricular  internal cavity size was severely dilated. Left ventricular diastolic  parameters are consistent with Grade II diastolic dysfunction  (pseudonormalization). The average left ventricular global longitudinal  strain is -10.5 %. The global longitudinal strain  is abnormal.   2. Right ventricular systolic function is mildly reduced. The right  ventricular size is normal.   3. Left atrial size was mild to moderately dilated.   4. Right atrial size was moderately dilated.   5. The mitral valve is degenerative. Mild to moderate mitral valve  regurgitation. No evidence of mitral stenosis.   6. Tricuspid valve regurgitation is mild to moderate.   7. The aortic valve has been repaired/replaced. Aortic valve  regurgitation versus perivalvular leak is trivial. Mild to moderate aortic  valve stenosis with at least some component of patient-prosthesis  mismatch. Aortic valve mean gradient measures 12.6  mmHg.   8. There is mild dilatation of the aortic root, measuring 43 mm. Known  severe dilation of the ascending aorta is not well-visualized on this  examination.   cMRI 06/2023  FINDINGS: Moderate LAE. Severe RAE. No PFO/ASD. No pericardial effusion. 27 mm mechanical bileaflet carbomedics AVR. Normal leaflet motion with mild closing volume AR. Severe ascending thoracic aorta dilatation 5.5 cm. No dissection. Does not appear to have had a Bentall procedure performed Mild MV leaflet thickening with mild  appearing MR. Moderate appearing TR. Severe LVE with global hypokinesis Mild RVE with mild RV hypokinesis   Quantitative LVEF:20%. (EDV 333 cc ESV 265 cc SV 68 cc) Estimated cardiac output 3.9 L/min   Quantitative RVEF: 41% (EDV 215 cc ESV 126 cc SV 88 cc )   Delayed gadolinium images showed mild area of basal septal mid myocardiac uptake/scar   Parametric Measures: Using Hct 43   T1 normal 1045 msec   ECV normal 26%   T2 normal 46 msec   IMPRESSION: 1.  Estimated cardiac output 3.9 L/min   [name]  [facility] 06/2023 Conclusions: Mild to moderate, non-obstructive coronary artery disease with 60% D1 stenosis and 20% mid LAD and proximal RCA lesions.  No significant change noted from prior angiogram in 2020. Moderately elevated left heart filling pressure (PCWP 30 mmHg with prominent v-waves). Severely elevated right heart filling pressure (RA 25, RVEDP 18 mmHg). Moderate pulmonary hypertension (PA 50/28, mean 35 mmHg). Moderately reduced Fick cardiac output/index (CO 4.4 L/min, CI 1.9 L/min/m^2).   Recommendations: Resume diuresis.  If renal function worsens and/or adequate diuresis cannot be achieved, inotropic therapy may need to be considered. Reduce metoprolol  succinate in the setting of low-output heart failure. Consult advanced heart failure and electrophysiology teams to assist with management of acute on chronic HFrEF and sustained ventricular tachycardia. Resume heparin  infusion 2 hours after TR band has been removed; transition back to warfarin once it is clear that no further invasive procedures will be needed this admission.   Sammy Crisp, MD [facility]  Echo 06/2023 1. Left ventricular ejection fraction, by estimation, is 20 to 25%. The  left ventricle has severely decreased function. The left ventricle  demonstrates global hypokinesis. The left ventricular internal cavity size  was mildly dilated. There is mild left  ventricular hypertrophy. Left  ventricular diastolic parameters are  indeterminate.   2. Right ventricular systolic function is moderately reduced. The right  ventricular size is normal. There is mildly elevated pulmonary artery  systolic pressure. The estimated right ventricular systolic pressure is  39.6 mmHg.   3. Left atrial size was moderately dilated.   4. The mitral valve is normal in structure. Mild mitral valve  regurgitation. No evidence of mitral stenosis.   5. Tricuspid valve regurgitation is moderate to severe.   6. The aortic valve has been repaired/replaced. There is a 27 mm  Carbometrics mechanical valve present. Aortic valve regurgitation is not  visualized. No aortic stenosis is present. Aortic valve mean gradient  measures 8.0 mmHg.   7. There is mild dilatation of the aortic arch, measuring 43 mm.   8. The inferior vena cava is dilated in size with <50% respiratory  variability, suggesting right atrial pressure of 15 mmHg.      Echo 04/2022  1. Left ventricular ejection fraction, by estimation, is 35 to 40%. The  left ventricle has moderately decreased function. The left ventricle  demonstrates regional wall motion abnormalities (challenging images but  appears to have anterior and  anteroseptal hypokinsis, apical region not well visualized). Left  ventricular diastolic parameters are consistent with Grade II diastolic  dysfunction (pseudonormalization).   2. Right ventricular systolic function  is mildly reduced (not well  visualized). The right ventricular size is normal. Tricuspid regurgitation  signal is inadequate for assessing PA pressure.   3. The mitral valve is normal in structure. No evidence of mitral valve  regurgitation. No evidence of mitral stenosis.   4. The aortic valve was not well visualized. Aortic valve regurgitation  is not visualized. No aortic stenosis is present. There is a 27 mm  Carbometrics mechanical valve present in the aortic position.   5. The inferior vena cava  is normal in size with greater than 50%  respiratory variability, suggesting right atrial pressure of 3 mmHg.   6. Left atrial size was mildly dilated.      Physical Exam:   VS:  BP 106/72 (BP Location: Left Arm, Patient Position: Sitting, Cuff Size: Normal)   Pulse 62   Resp 16   Ht 5\' 6"  (1.676 m)   Wt 269 lb 12.8 oz (122.4 kg)   SpO2 95%   BMI 43.55 kg/m    Wt Readings from Last 3 Encounters:  11/16/23 269 lb 12.8 oz (122.4 kg)  11/15/23 269 lb (122 kg)  11/12/23 266 lb 3.2 oz (120.7 kg)    GEN: Well nourished, well developed in no acute distress NECK: No JVD; No carotid bruits CARDIAC: RRR, no murmurs, rubs, gallops RESPIRATORY:  Clear to auscultation without rales, wheezing or rhonchi  ABDOMEN: Soft, non-tender, non-distended EXTREMITIES:  No edema; No deformity   ASSESSMENT AND PLAN: .    Chronic HFrEF Nonischemic cardiomyopathy EF Patient reports 15 pound weight gain over the last few months.  Weight seems to be fluctuating according to recorded weights in epic, however unsure if they are accurate.  Weight a month ago 258lbs and weight today is 269lbs.  Recent BNP was 192 and PCP just increased torsemide  from 40 mg daily to 60 mg daily.  On exam, patient does not appear volume up.  No lower leg edema appreciated, lungs clear, no JVD.  He says all his volume is in his stomach.  I will increase torsemide  to 80 mg daily for 3 days then back down to 40 mg daily.  He has an appointment with advanced heart failure team next week.  Continue losartan  25 mg daily, Farxiga  10 mg daily and spironolactone  12.5 mg daily.  Blood pressure low, limiting GDMT.  VT Status post ICD Continue amiodarone  200 mg daily.  Implanted 06/25/2023, followed by EP.  ICD interrogation so far unremarkable.  Paroxysmal A-fib Patient is in normal sinus rhythm on exam.  Continue amiodarone  200 mg daily and warfarin for stroke prophylaxis in the setting of mechanical aortic valve.    Thoracic aortic  aneurysm History of mechanical MVR Ongoing surveillance by Dr. Ames Bakes.  Continue blood pressure and lipid control.  Continue anticoagulation and antiplatelet therapy.  CAD Patient denies chest pain.  Prior cath showed nonobstructive CAD.  Continue secondary prevention with aspirin  and Crestor .  Hyperlipidemia LDL 65. Continue Crestor  and fenofibrate .       Dispo: Follow-up in 4 months  Signed, Khyre Germond Rebekah Canada, [name]

## 2023-11-16 NOTE — Patient Instructions (Signed)
 Medication Instructions:  Your physician recommends the following medication changes.  INCREASE: Torsemide  to 80 mg by mouth daily for 3 days, then return to 40 mg daily.  *If you need a refill on your cardiac medications before your next appointment, please call your pharmacy*  Lab Work: No labs ordered today   Testing/Procedures: No test ordered today   Follow-Up: At [facility], you and your health needs are our priority.  As part of our continuing mission to provide you with exceptional heart care, our providers are all part of one team.  This team includes your primary Cardiologist (physician) and Advanced Practice Providers or APPs (Physician Assistants and Nurse Practitioners) who all work together to provide you with the care you need, when you need it.  Your next appointment:   4 month(s)  Provider:   You may see Sammy Crisp, MD or one of the following Advanced Practice Providers on your designated Care Team:   Laneta Pintos, NP Gildardo Labrador, [name] Varney Gentleman, [name] Cadence Paw [place]aw, [name] Ronald Cockayne, NP Morey Ar, NP

## 2023-11-19 ENCOUNTER — Other Ambulatory Visit

## 2023-11-19 ENCOUNTER — Ambulatory Visit: Attending: Family | Admitting: Family

## 2023-11-19 ENCOUNTER — Other Ambulatory Visit: Payer: Self-pay

## 2023-11-19 ENCOUNTER — Encounter: Payer: Self-pay | Admitting: Family

## 2023-11-19 ENCOUNTER — Telehealth: Admitting: *Deleted

## 2023-11-19 VITALS — BP 141/92 | HR 77 | Wt 263.4 lb

## 2023-11-19 DIAGNOSIS — R0602 Shortness of breath: Secondary | ICD-10-CM | POA: Diagnosis not present

## 2023-11-19 DIAGNOSIS — Z7984 Long term (current) use of oral hypoglycemic drugs: Secondary | ICD-10-CM | POA: Diagnosis not present

## 2023-11-19 DIAGNOSIS — I5022 Chronic systolic (congestive) heart failure: Secondary | ICD-10-CM

## 2023-11-19 DIAGNOSIS — I350 Nonrheumatic aortic (valve) stenosis: Secondary | ICD-10-CM | POA: Insufficient documentation

## 2023-11-19 DIAGNOSIS — I2510 Atherosclerotic heart disease of native coronary artery without angina pectoris: Secondary | ICD-10-CM

## 2023-11-19 DIAGNOSIS — Z79899 Other long term (current) drug therapy: Secondary | ICD-10-CM | POA: Insufficient documentation

## 2023-11-19 DIAGNOSIS — N183 Chronic kidney disease, stage 3 unspecified: Secondary | ICD-10-CM | POA: Diagnosis not present

## 2023-11-19 DIAGNOSIS — I255 Ischemic cardiomyopathy: Secondary | ICD-10-CM | POA: Insufficient documentation

## 2023-11-19 DIAGNOSIS — I480 Paroxysmal atrial fibrillation: Secondary | ICD-10-CM

## 2023-11-19 DIAGNOSIS — R059 Cough, unspecified: Secondary | ICD-10-CM | POA: Diagnosis not present

## 2023-11-19 DIAGNOSIS — I7121 Aneurysm of the ascending aorta, without rupture: Secondary | ICD-10-CM | POA: Diagnosis not present

## 2023-11-19 DIAGNOSIS — E669 Obesity, unspecified: Secondary | ICD-10-CM | POA: Insufficient documentation

## 2023-11-19 DIAGNOSIS — Z7982 Long term (current) use of aspirin: Secondary | ICD-10-CM | POA: Insufficient documentation

## 2023-11-19 DIAGNOSIS — I130 Hypertensive heart and chronic kidney disease with heart failure and stage 1 through stage 4 chronic kidney disease, or unspecified chronic kidney disease: Secondary | ICD-10-CM | POA: Insufficient documentation

## 2023-11-19 DIAGNOSIS — Z7901 Long term (current) use of anticoagulants: Secondary | ICD-10-CM | POA: Insufficient documentation

## 2023-11-19 DIAGNOSIS — I472 Ventricular tachycardia, unspecified: Secondary | ICD-10-CM

## 2023-11-19 DIAGNOSIS — I4819 Other persistent atrial fibrillation: Secondary | ICD-10-CM | POA: Diagnosis not present

## 2023-11-19 DIAGNOSIS — Z952 Presence of prosthetic heart valve: Secondary | ICD-10-CM | POA: Insufficient documentation

## 2023-11-19 DIAGNOSIS — G4733 Obstructive sleep apnea (adult) (pediatric): Secondary | ICD-10-CM | POA: Diagnosis not present

## 2023-11-19 NOTE — Patient Outreach (Addendum)
 Complex Care Management   Visit Note  11/19/2023  Name:  Roberto [name] MRN: [id number] DOB: [birth year]  Situation: Referral received for Complex Care Management related to SDOH Barriers:  Lack of essential utilities [employer] I obtained verbal consent from Caregiver Patient.  Visit completed with patient and caregiver Roberto [name]  on the phone  Background:   Past Medical History:  Diagnosis Date   Aneurysm (HCC)    Bicuspid aortic valve    a. s/p #27 Carbomedics mechanical valve on 03/25/2010; b. on Coumadin ; c. TTE 12/17: EF 40-45%, moderately dilated LV with moderate LVH, AVR well-seated with 14 mmHg gradient, peak AV velocity 2.5 m/s, mild mitral valve thickening with mild MR, mildly dilated RV with mildly reduced contraction   Cellulitis    CHF (congestive heart failure) (HCC)    Chronic kidney disease    Chronic systolic CHF (congestive heart failure) (HCC)    a. R/LHC 03/2010 showed no significant CAD, LVEDP 31 mmHg, mean AoV gradient 34 mmHg at rest and 47 mmHg with dobutamine 20 mcg/kg/min, AVA 1.0 cm^2, RA 31, RV 68/25, PA 68/47, PCWP 38. PA sat 65%. CO 6.2 L/min (Fick) and 5.3 L/min (thermodilution)   Clotting disorder (HCC)    COPD (chronic obstructive pulmonary disease) (HCC)    H/O mechanical aortic valve replacement 03/25/2010   a. #27 Carbomedics mechanical valve   Hearing loss    High cholesterol    HTN (hypertension)    Hypercholesterolemia    Renal infarct [facility] 2017   Multiple right renal infarcts, likely embolic.   Stage 3 chronic kidney disease (HCC)    Stroke [facility])    TIA (transient ischemic attack) 05/2014    Assessment: SW completed a telephone outreach with patient and caregiver. She states the resource SW provided was unable to assist because patient was not connected to the [employer]. She states she has to come up with $400 by the end of the month. SW encouraged to continue to try community resources and contact DSS for an increase in foodstamps.    SDOH  Interventions    Flowsheet Row Office Visit from 11/15/2023 in [facility] Office Visit from 11/12/2023 in [facility] Care Coordination from 10/24/2023 in [place] [facility] Patient Outreach from 10/19/2023 in [place] [facility] Telephone from 10/05/2023 in [facility] Care Coordination Office Visit from 09/26/2023 in [facility]  SDOH Interventions        Food Insecurity Interventions -- -- Intervention Not Indicated Intervention Not Indicated Intervention Not Indicated --  Housing Interventions -- -- Intervention Not Indicated Intervention Not Indicated Intervention Not Indicated --  Transportation Interventions -- -- Intervention Not Indicated Intervention Not Indicated -- --  Utilities Interventions -- -- Other (Comment)  [referral through churches discussed] Other (Comment)  [Referral placed for outreach with [employer] Care Guide] [employer] Provided  [encouraged follow up with [employer] and DSS-however reports no funding] --  Depression Interventions/Treatment  Medication, Currently on Treatment Currently on Treatment, Medication -- -- -- PHQ2-9 Score <4 Follow-up Not Indicated  Social Connections Interventions -- -- -- Intervention Not Indicated  [Declines current need for resources. Agreed to update care team if her requires assistance.] -- --       Recommendation:   No recommendations at this time.  Follow Up Plan:   12/20/23  Roberto [name], Roberto [name], MHA Roberto [name]  [facility] Social Worker, Population Health [phone]

## 2023-11-19 NOTE — Patient Instructions (Signed)
 Visit Information  Thank you for taking time to visit with me today. Please don't hesitate to contact me if I can be of assistance to you before our next scheduled appointment.  Your next care management appointment is by telephone on 12/20/23 at 11:00am    Please call the care guide team at [phone] if you need to cancel, schedule, or reschedule an appointment.   Please call the Suicide and Crisis Lifeline: 988 call the [place]  National Suicide Prevention Lifeline: 440-038-0737 or TTY: (432)761-1656 TTY 657-814-8190) to talk to a trained counselor call 1-800-273-TALK (toll free, 24 hour hotline) call 911 if you are experiencing a Mental Health or Behavioral Health Crisis or need someone to talk to.  Valora Gear, Florestine Hurl, MHA [place]  [facility] Social Worker, Population Health [phone]

## 2023-11-19 NOTE — Patient Instructions (Signed)
 Medication Changes:  No medication changes today!  Lab Work:  Go DOWN to LOWER LEVEL (LL) to have your blood work completed inside of [employer] office.  We will only call you if the results are abnormal or if the provider would like to make medication changes.   Referrals:  You have been referred to Gaylyn Keas for obstructive sleep apnea. This office will be giving you a call.    Follow-Up in: Please follow up with the [facility] in 1 month with Shawnee Dellen, FNP.  At the [facility], you and your health needs are our priority. We have a designated team specialized in the treatment of Heart Failure. This Care Team includes your primary Heart Failure Specialized Cardiologist (physician), Advanced Practice Providers (APPs- Physician Assistants and Nurse Practitioners), and Pharmacist who all work together to provide you with the care you need, when you need it.   You may see any of the following providers on your designated Care Team at your next follow up:  Dr. Jules Oar Dr. Peder Bourdon Dr. Alwin Baars Dr. Judyth Nunnery Shawnee Dellen, FNP Bevely Brush, RPH-CPP  Please be sure to bring in all your medications bottles to every appointment.   Need to Contact Us :  If you have any questions or concerns before your next appointment please send us  a message through [place] or call our office at [phone].    TO LEAVE A MESSAGE FOR THE NURSE SELECT OPTION 2, PLEASE LEAVE A MESSAGE INCLUDING: YOUR NAME DATE OF BIRTH CALL BACK NUMBER REASON FOR CALL**this is important as we prioritize the call backs  YOU WILL RECEIVE A CALL BACK THE SAME DAY AS LONG AS YOU CALL BEFORE 4:00 PM

## 2023-11-19 NOTE — Progress Notes (Signed)
 ADVANCED HEART FAILURE CLINIC NOTE   Referring Physician: [name], DO  Primary Care: [name], DO (last seen 05/25) Primary Cardiologist: Sammy Crisp, MD / Further, [place], PA (last seen 05/25)  CC: shortness of breath  HPI:  Roberto [name] is a 68 y.o. male with aortic stenosis status post mechanical AVR, large thoracic aortic aneurysm followed by cardiac surgery, chronic systolic heart failure and nonobstructive coronary artery disease.   His cardiac history dates back to March 25, 2010 when he underwent mechanical aortic valve replacement; has been on warfarin since that time. Echocardiogram in 2020 with reduction in LVEF to 40 to 45% and dilation of the ascending aorta measuring 4.6 cm. In 2022 he underwent left and right heart catheterization which demonstrated 60% stenosis of a large diagonal branch but otherwise only mild disease of the mid LAD and proximal RCA. In 2023 repeat echo with reduction in EF to 35 to 40%.   Presented to [facility] in 12/24. Initial EKG was consistent with monomorphic ventricular tachycardia that resolved with IV amiodarone  with repeat EKG demonstrating atrial fibrillation with PVCs. LHC with stable nonobstructive CAD; RHC w/ cardiac index severely reduced at 1.9 L/min/m2 with severely elevated filling pressures.He was briefly placed on inotropes and aggressively diuresed.   Admitted in 2/25 with atrial fibrillation and some episodes of VT requiring ATP.   Seen in [facility] 03/25 where losartan  was increased to 25mg  daily.   He presents today for a HF follow-up visit with a chief complaint of shortness of breath especially when coughing. Has associated dry cough, dizziness with coughing, abdominal bloating and weight loss. Sleeping well on 1 pillow. Denies chest pain, palpitations, pedal edema or difficulty sleeping.   Has taken torsemide  80mg  daily for the last 3 days. Will resume 40mg  dose tomorrow. Says that his weight has declined since  doing this and his abdominal bloating feels better. He has an aide that manages his medications and she didn't come with him today and he didn't bring his meds or a list with him.   Drinks 56-84 ounces of fluid daily (water & soda)  ROS: All systems negative except what is listed in HPI, PMH and Problem List   Past Medical History:  Diagnosis Date   Aneurysm (HCC)    Bicuspid aortic valve    a. s/p #27 Carbomedics mechanical valve on 03/25/2010; b. on Coumadin ; c. TTE 12/17: EF 40-45%, moderately dilated LV with moderate LVH, AVR well-seated with 14 mmHg gradient, peak AV velocity 2.5 m/s, mild mitral valve thickening with mild MR, mildly dilated RV with mildly reduced contraction   Cellulitis    CHF (congestive heart failure) (HCC)    Chronic kidney disease    Chronic systolic CHF (congestive heart failure) (HCC)    a. R/LHC 03/2010 showed no significant CAD, LVEDP 31 mmHg, mean AoV gradient 34 mmHg at rest and 47 mmHg with dobutamine 20 mcg/kg/min, AVA 1.0 cm^2, RA 31, RV 68/25, PA 68/47, PCWP 38. PA sat 65%. CO 6.2 L/min (Fick) and 5.3 L/min (thermodilution)   Clotting disorder (HCC)    COPD (chronic obstructive pulmonary disease) (HCC)    H/O mechanical aortic valve replacement 03/25/2010   a. #27 Carbomedics mechanical valve   Hearing loss    High cholesterol    HTN (hypertension)    Hypercholesterolemia    Renal infarct [facility]) 2017   Multiple right renal infarcts, likely embolic.   Stage 3 chronic kidney disease (HCC)    Stroke (HCC)  TIA (transient ischemic attack) 05/2014    Current Outpatient Medications  Medication Sig Dispense Refill   acetaminophen  (TYLENOL ) 500 MG tablet Take 1,000 mg by mouth every 6 (six) hours as needed for moderate pain (pain score 4-6).     albuterol  (PROVENTIL ) (2.5 MG/3ML) 0.083% nebulizer solution USE 1 VIAL IN NEBULIZER EVERY 6 HOURS AS NEEDED FOR WHEEZING FOR SHORTNESS OF BREATH 180 mL 1   allopurinol  (ZYLOPRIM ) 300 MG tablet Take 1 tablet  (300 mg total) by mouth daily. 90 tablet 1   amiodarone  (PACERONE ) 200 MG tablet Take 200 mg by mouth daily.     aspirin  81 MG chewable tablet Chew 81 mg by mouth in the morning.     buPROPion  (WELLBUTRIN  XL) 300 MG 24 hr tablet Take 300 mg by mouth daily.     dapagliflozin  propanediol (FARXIGA ) 10 MG TABS tablet Take 1 tablet (10 mg total) by mouth daily before breakfast. 30 tablet 11   fenofibrate  (TRICOR ) 48 MG tablet Take 1 tablet (48 mg total) by mouth daily. 90 tablet 3   losartan  (COZAAR ) 25 MG tablet Take 1 tablet (25 mg total) by mouth daily. 90 tablet 3   metFORMIN  (GLUCOPHAGE -XR) 500 MG 24 hr tablet Take 1 tablet (500 mg total) by mouth daily with breakfast. 90 tablet 1   nortriptyline  (PAMELOR ) 10 MG capsule Take 1 capsule by mouth at bedtime 90 capsule 0   nystatin  (MYCOSTATIN ) 100000 UNIT/ML suspension 5mL swish and spit TID 473 mL 1   Omega-3 Fatty Acids (OMEGA 3 PO) Take 1,500 mg by mouth 2 (two) times daily.     OZEMPIC , 1 MG/DOSE, 4 MG/3ML SOPN Inject 1 mg into the skin once a week. On Mondays     potassium chloride  SA (KLOR-CON  M) 20 MEQ tablet Take 20 mEq by mouth at bedtime.     rosuvastatin  (CRESTOR ) 40 MG tablet Take 1 tablet (40 mg total) by mouth daily. 90 tablet 3   Spacer/Aero-Holding [facility] Use as directed Dx: COPD, J44.9 1 each 1   spironolactone  (ALDACTONE ) 25 MG tablet Take 0.5 tablets (12.5 mg total) by mouth daily. 45 tablet 3   sulfamethoxazole -trimethoprim  (BACTRIM  DS) 800-160 MG tablet Take 1 tablet by mouth 2 (two) times daily. 14 tablet 0   torsemide  (DEMADEX ) 20 MG tablet Take 2 tablets by mouth once daily 180 tablet 0   TRELEGY ELLIPTA  100-62.5-25 MCG/ACT AEPB Inhale 1 puff by mouth once daily 180 each 1   VENTOLIN  HFA 108 (90 Base) MCG/ACT inhaler INHALE 2 PUFFS BY MOUTH EVERY 6 HOURS AS NEEDED FOR WHEEZING OR SHORTNESS OF BREATH 54 g 6   warfarin (COUMADIN ) 1 MG tablet Take 1 tablet (1 mg total) by mouth daily. 30 tablet 3   warfarin (COUMADIN ) 3  MG tablet Take 1 tablet (3 mg total) by mouth daily. 30 tablet 3   Current Facility-Administered Medications  Medication Dose Route Frequency Provider Last Rate Last Admin   albuterol  (PROVENTIL ) (2.5 MG/3ML) 0.083% nebulizer solution 2.5 mg  2.5 mg Nebulization Once        Vitals:   11/19/23 0810  BP: (!) 141/92  Pulse: 77  SpO2: 96%  Weight: 263 lb 6.4 oz (119.5 kg)   Wt Readings from Last 3 Encounters:  11/19/23 263 lb 6.4 oz (119.5 kg)  11/16/23 269 lb 12.8 oz (122.4 kg)  11/15/23 269 lb (122 kg)   Lab Results  Component Value Date   CREATININE 1.88 (H) 11/15/2023   CREATININE 1.67 (H) 11/07/2023  CREATININE 1.68 (H) 10/10/2023    PHYSICAL EXAM:  General: Well appearing. No resp difficulty HEENT: normal Neck: supple, no JVD Cor: Regular rhythm, rate. No rubs, gallops or murmurs Lungs: clear Abdomen: soft, nontender, nondistended. Extremities: no cyanosis, clubbing, rash, edema Neuro: alert & oriented X 3. Moves all 4 extremities w/o difficulty. Affect pleasant   DATA REVIEW  ECG: 07/23/22: NSR w/ LBBB   09/10/23: NSR w/ LBBB  ECHO: 06/20/23: LVEF 20-25%, moderately reduced RV function.  09/27/23: LVEF 25-30%, G2DD, mildly reduced RV function.   CATH: 06/20/23: Mild to moderate, non-obstructive coronary artery disease with 60% D1 stenosis and 20% mid LAD and proximal RCA lesions.  No significant change noted from prior angiogram in 2020. Moderately elevated left heart filling pressure (PCWP 30 mmHg with prominent v-waves). Severely elevated right heart filling pressure (RA 25, RVEDP 18 mmHg). Moderate pulmonary hypertension (PA 50/28, mean 35 mmHg). Moderately reduced Fick cardiac output/index (CO 4.4 L/min, CI 1.9 L/min/m^2).  CMR:  06/25/23:  1. Severe LVE with global hypokinesis LVEF 20%  2. Delayed gadolinium images with mid myocardial basal septal uptake not consistent with CAD  3.  Mild RVE with hypokinesis RVEF 41%  5. Severe ascending thoracic  aneurysm measuring 5.5 cm Patient does not appear to have had Bentall procedure during his AVR Note non contrast chest CT done 03/17/23 measured this at 5.9 cm He has been seen by Dr Sherene Dilling CVTS on 02/14/23 and not thought to be a redo candidate due to co morbidities 7. Bileaflet 27 mm AVR with carbomedics valve mild closing volume AR and normal leaflet motion  Sleep study: 2020 1. Moderate Obstructive Sleep Apnea (G47.33) with AHI 19.6/hr. 2. Mild Oxygen desaturations as low as 83%. Time spent with O2 sats < 88% was 0.3 min. 3. Severe Snoring. 4. Average heart rate 62bpm. 5. Normal sleep onset latency at 30 min. 6. Prolonged REM sleep onset latency at 302 min. 7. Fragmented sleep with 46 awakenings during sleep.   ASSESSMENT & PLAN:  Heart failure with reduced EF Etiology of UE:[id number] cardiomyopathy with likely nonischemic component NYHA class / AHA Stage:NYHA III Volume status & Diuretics: Euvolemic, torsemide  80mg  for today (prev given by cardiology) and then back to 40mg  daily Vasodilators:losartan  25mg  daily.  Beta-Blocker: HR 77; consider adding bisoprolol at next visit MRA:spironolactone  12.5mg  daily Cardiometabolic:farxiga  10mg  Devices therapies & Valvulopathies:ICD, implanted 06/25/23 by Dr. Carolynne Citron; unable to get device interrogated today Advanced therapies:not a candidate at this time.  - weight up 6 pounds from last visit here 2 months ago although he says since diuretic increased, he's lost 6 pounds by home weight - BMET/ BNP today to address diuretic use  Persistent Atrial fibrillation  - Amiodarone  200mg  QD  - consider starting bisoprolol at next visit   Ventricular tachycardia - Currently taking amiodarone  200mg  QD for atrial fibrillation  - Followed by Dr. Carolynne Citron - Reviewed device interrogation from 09/25/23; [employer]. Jude ICD. No shocks; 1% RV pacing. No arrhythmias detected.   CAD - Stable non-obstructive disease - continue ASA 81mg  daily and crestor .  -  Reviewed Dr. Colby Daub notes from 04/25 - No chest pain  Thoracic aortic aneurysm - Large ascending aortic aneurysm; followed by Dr. Sherene Dilling.  - This would complicate any attempts at advanced therapies.  - Seen by Dr. Sherene Dilling 03/25; stable 5.4x5.6cm fusiform ascending aortic aneurysm. At this time due to comorbid conditions plan is to continue monitoring.   OSA - Sleep study reviewed from 2020.Roberto [name] He has an AHI of close  to 20/hr; abnormal. - Does not use CPAP. Reports that his machine/mask is old.  - referral made to Dr. Charl Concha office today  Obesity - discussed importance of weight loss today  - on ozempic  1mg  weekly; tolerating it well.    Return in 1 month, sooner if needed.   Shawnee Dellen, [place] 11/19/23

## 2023-11-20 ENCOUNTER — Ambulatory Visit: Payer: Self-pay | Admitting: Family

## 2023-11-20 ENCOUNTER — Ambulatory Visit (INDEPENDENT_AMBULATORY_CARE_PROVIDER_SITE_OTHER): Admitting: Family Medicine

## 2023-11-20 ENCOUNTER — Encounter: Payer: Self-pay | Admitting: Family Medicine

## 2023-11-20 VITALS — BP 158/102 | HR 68 | Temp 97.6°F | Ht 66.0 in | Wt 263.2 lb

## 2023-11-20 DIAGNOSIS — Z952 Presence of prosthetic heart valve: Secondary | ICD-10-CM | POA: Diagnosis not present

## 2023-11-20 DIAGNOSIS — E1159 Type 2 diabetes mellitus with other circulatory complications: Secondary | ICD-10-CM

## 2023-11-20 DIAGNOSIS — I5022 Chronic systolic (congestive) heart failure: Secondary | ICD-10-CM | POA: Diagnosis not present

## 2023-11-20 DIAGNOSIS — I152 Hypertension secondary to endocrine disorders: Secondary | ICD-10-CM | POA: Diagnosis not present

## 2023-11-20 LAB — BASIC METABOLIC PANEL WITH GFR
BUN/Creatinine Ratio: 15 (ref 10–24)
BUN: 32 mg/dL — ABNORMAL HIGH (ref 8–27)
CO2: 20 mmol/L (ref 20–29)
Calcium: 9.9 mg/dL (ref 8.6–10.2)
Chloride: 97 mmol/L (ref 96–106)
Creatinine, Ser: 2.13 mg/dL — ABNORMAL HIGH (ref 0.76–1.27)
Glucose: 98 mg/dL (ref 70–99)
Potassium: 4.8 mmol/L (ref 3.5–5.2)
Sodium: 137 mmol/L (ref 134–144)
eGFR: 33 mL/min/{1.73_m2} — ABNORMAL LOW (ref >59)

## 2023-11-20 LAB — BRAIN NATRIURETIC PEPTIDE: BNP: 121.9 pg/mL — ABNORMAL HIGH (ref 0.0–100.0)

## 2023-11-20 LAB — COAGUCHEK XS/INR WAIVED
INR: 2.6 — ABNORMAL HIGH (ref 0.9–1.1)
Prothrombin Time: 31.2 s

## 2023-11-20 MED ORDER — SPIRONOLACTONE 25 MG PO TABS: 25.0000 mg | ORAL_TABLET | Freq: Every day | ORAL | Status: DC

## 2023-11-20 NOTE — Progress Notes (Signed)
 BP (!) 158/102 (BP Location: Left Arm, Patient Position: Sitting, Cuff Size: Large)   Pulse 68   Temp 97.6 F (36.4 C) (Oral)   Ht 5\' 6"  (1.676 m)   Wt 263 lb 3.2 oz (119.4 kg)   SpO2 97%   BMI 42.48 kg/m    Subjective:    Patient ID: Roberto [name], male    DOB: 1955-08-08, 68 y.o.   MRN: [id number]  HPI: Roberto [name] is a 68 y.o. male  Chief Complaint  Patient presents with   Cough   Congestive Heart Failure   Coumadin  Management.  The expected duration of coumadin  treatment is lifelong The reason for anticoagulation is  mechanical heart valve.  Present Coumadin  dose: 4mg  daily Goal: 2.5-3.5  Excessive bruising: no Nose bleeding: no Rectal bleeding: no Prolonged menstrual cycles: N/A Eating diet with consistent amounts of foods containing Vitamin K:no Any recent antibiotic use? no  Feels like he's doing better on the higher dose of the torsemide . He is not sure if he was taking 60mg  or 80mg . BP has been running high yesterday and today- nothing else changed. Unsure why this is happening.   HYPERTENSION  Hypertension status: exacerbated  Satisfied with current treatment? no Duration of hypertension: chronic BP monitoring frequency:  not checking BP medication side effects:  no Medication compliance: good compliance Aspirin : no Recurrent headaches: no Visual changes: no Palpitations: no Dyspnea: yes Chest pain: no Lower extremity edema: no Dizzy/lightheaded: no  Relevant past medical, surgical, family and social history reviewed and updated as indicated. Interim medical history since our last visit reviewed. Allergies and medications reviewed and updated.  Review of Systems  Constitutional: Negative.   HENT: Negative.    Respiratory:  Positive for shortness of breath. Negative for apnea, cough, choking, chest tightness, wheezing and stridor.   Cardiovascular:  Negative for chest pain, palpitations and leg swelling.  Gastrointestinal:  Negative for  abdominal distention, abdominal pain, anal bleeding, blood in stool, constipation, diarrhea, nausea, rectal pain and vomiting.  Musculoskeletal: Negative.   Neurological: Negative.   Psychiatric/Behavioral: Negative.      Per HPI unless specifically indicated above     Objective:     BP (!) 158/102 (BP Location: Left Arm, Patient Position: Sitting, Cuff Size: Large)   Pulse 68   Temp 97.6 F (36.4 C) (Oral)   Ht 5\' 6"  (1.676 m)   Wt 263 lb 3.2 oz (119.4 kg)   SpO2 97%   BMI 42.48 kg/m   Wt Readings from Last 3 Encounters:  11/20/23 263 lb 3.2 oz (119.4 kg)  11/19/23 263 lb 6.4 oz (119.5 kg)  11/16/23 269 lb 12.8 oz (122.4 kg)    Physical Exam Vitals and nursing note reviewed.  Constitutional:      General: He is not in acute distress.    Appearance: Normal appearance. He is not ill-appearing, toxic-appearing or diaphoretic.  HENT:     Head: Normocephalic and atraumatic.     Right Ear: External ear normal.     Left Ear: External ear normal.     Nose: Nose normal.     Mouth/Throat:     Mouth: Mucous membranes are moist.     Pharynx: Oropharynx is clear.  Eyes:     General: No scleral icterus.       Right eye: No discharge.        Left eye: No discharge.     Extraocular Movements: Extraocular movements intact.     Conjunctiva/sclera: Conjunctivae normal.  Pupils: Pupils are equal, round, and reactive to light.  Cardiovascular:     Rate and Rhythm: Normal rate and regular rhythm.     Pulses: Normal pulses.     Heart sounds: No murmur heard.    No friction rub. No gallop.  Pulmonary:     Effort: Pulmonary effort is normal. No respiratory distress.     Breath sounds: Normal breath sounds. No stridor. No wheezing, rhonchi or rales.  Chest:     Chest wall: No tenderness.  Musculoskeletal:        General: Normal range of motion.     Cervical back: Normal range of motion and neck supple.  Skin:    General: Skin is warm and dry.     Capillary Refill: Capillary  refill takes less than 2 seconds.     Coloration: Skin is not jaundiced or pale.     Findings: No bruising, erythema, lesion or rash.  Neurological:     General: No focal deficit present.     Mental Status: He is alert and oriented to person, place, and time. Mental status is at baseline.  Psychiatric:        Mood and Affect: Mood normal.        Behavior: Behavior normal.        Thought Content: Thought content normal.        Judgment: Judgment normal.     Results for orders placed or performed in visit on 11/20/23  CoaguChek XS/INR Waived   Collection Time: 11/20/23  8:23 AM  Result Value Ref Range   INR 2.6 (H) 0.9 - 1.1   Prothrombin Time 31.2 sec   *Note: Due to a large number of results and/or encounters for the requested time period, some results have not been displayed. A complete set of results can be found in Results Review.      Assessment & Plan:   Problem List Items Addressed This Visit       Cardiovascular and Mediastinum   Hypertension associated with diabetes (HCC)   BP running high. Will CHF exacerbation will increase his spironalactone to 25mg  and recheck 1 week. Call with any concerns.       Relevant Medications   spironolactone  (ALDACTONE ) 25 MG tablet   Chronic HFrEF (heart failure with reduced ejection fraction) (HCC)   Weight down 6lbs. SOB improved. Rales gone. Had labs done at cardiology yesterday. Recheck 1 week. Call with any concerns.       Relevant Medications   spironolactone  (ALDACTONE ) 25 MG tablet     Other   H/O mechanical aortic valve replacement - Primary   Doing well with INR of 2.6. Will continue 4mg  of coumadin  and recheck in 1 week. Call with any concerns.       Relevant Orders   CoaguChek XS/INR Waived (Completed)     Follow up plan: Return in about 1 week (around 11/27/2023) for OK to double book if we have to.

## 2023-11-20 NOTE — Patient Instructions (Signed)
 Go back to 40mg  on the torsemide  Go up to a whole pill (25mg ) on the spironalactone.

## 2023-11-20 NOTE — Assessment & Plan Note (Signed)
 Doing well with INR of 2.6. Will continue 4mg  of coumadin  and recheck in 1 week. Call with any concerns.

## 2023-11-20 NOTE — Assessment & Plan Note (Signed)
 BP running high. Will CHF exacerbation will increase his spironalactone to 25mg  and recheck 1 week. Call with any concerns.

## 2023-11-20 NOTE — Assessment & Plan Note (Signed)
 Weight down 6lbs. SOB improved. Rales gone. Had labs done at cardiology yesterday. Recheck 1 week. Call with any concerns.

## 2023-11-22 ENCOUNTER — Encounter: Admitting: Cardiology

## 2023-11-23 ENCOUNTER — Other Ambulatory Visit: Payer: Self-pay | Admitting: Family

## 2023-11-23 MED ORDER — SPIRONOLACTONE 25 MG PO TABS: 25.0000 mg | ORAL_TABLET | Freq: Every day | ORAL | 3 refills | Status: DC

## 2023-11-25 ENCOUNTER — Other Ambulatory Visit: Payer: Self-pay | Admitting: Family Medicine

## 2023-11-27 ENCOUNTER — Encounter: Payer: Self-pay | Admitting: Family Medicine

## 2023-11-27 ENCOUNTER — Ambulatory Visit (INDEPENDENT_AMBULATORY_CARE_PROVIDER_SITE_OTHER): Admitting: Family Medicine

## 2023-11-27 ENCOUNTER — Telehealth: Payer: Self-pay

## 2023-11-27 ENCOUNTER — Other Ambulatory Visit

## 2023-11-27 VITALS — BP 106/71 | HR 71 | Ht 66.0 in | Wt 263.0 lb

## 2023-11-27 DIAGNOSIS — I5023 Acute on chronic systolic (congestive) heart failure: Secondary | ICD-10-CM | POA: Diagnosis not present

## 2023-11-27 DIAGNOSIS — Z952 Presence of prosthetic heart valve: Secondary | ICD-10-CM | POA: Diagnosis not present

## 2023-11-27 DIAGNOSIS — E1159 Type 2 diabetes mellitus with other circulatory complications: Secondary | ICD-10-CM

## 2023-11-27 DIAGNOSIS — I152 Hypertension secondary to endocrine disorders: Secondary | ICD-10-CM | POA: Diagnosis not present

## 2023-11-27 LAB — COAGUCHEK XS/INR WAIVED
INR: 2.2 — ABNORMAL HIGH (ref 0.9–1.1)
Prothrombin Time: 26.4 s

## 2023-11-27 MED ORDER — ENOXAPARIN SODIUM 40 MG/0.4ML IJ SOSY: 120.0000 mg | PREFILLED_SYRINGE | Freq: Two times a day (BID) | INTRAMUSCULAR | 0 refills | Status: DC

## 2023-11-27 NOTE — Telephone Encounter (Signed)
 Sending to PCP for review only.

## 2023-11-27 NOTE — Progress Notes (Signed)
 BP 106/71 (BP Location: Left Arm, Patient Position: Sitting, Cuff Size: Large)   Pulse 71   Ht 5\' 6"  (1.676 m)   Wt 263 lb (119.3 kg)   SpO2 98%   BMI 42.45 kg/m    Subjective:    Patient ID: Roberto [name], male    DOB: 21-Mar-1956, 68 y.o.   MRN: [id number]  HPI: Roberto [name] is a 68 y.o. male  No chief complaint on file.  Coumadin  Management.  The expected duration of coumadin  treatment is lifelong The reason for anticoagulation is  mechanical heart valve.  Present Coumadin  dose: 4mg  daily Goal: 2.5-3.5  Excessive bruising: no Nose bleeding: no Rectal bleeding: no Prolonged menstrual cycles: N/A Eating diet with consistent amounts of foods containing Vitamin K:yes Any recent antibiotic use? yes  HYPERTENSION  Hypertension status: better  Satisfied with current treatment? yes Duration of hypertension: chronic BP monitoring frequency:  weekly BP medication side effects:  no Medication compliance: excellent compliance Previous BP meds: spironalactone, losartan , torsemide  Aspirin : no Recurrent headaches: no Visual changes: no Palpitations: no Dyspnea: no Chest pain: no Lower extremity edema: no Dizzy/lightheaded: no  Relevant past medical, surgical, family and social history reviewed and updated as indicated. Interim medical history since our last visit reviewed. Allergies and medications reviewed and updated.  Review of Systems  Constitutional: Negative.   Respiratory: Negative.    Cardiovascular: Negative.   Musculoskeletal: Negative.   Neurological: Negative.   Psychiatric/Behavioral: Negative.      Per HPI unless specifically indicated above     Objective:     BP 106/71 (BP Location: Left Arm, Patient Position: Sitting, Cuff Size: Large)   Pulse 71   Ht 5\' 6"  (1.676 m)   Wt 263 lb (119.3 kg)   SpO2 98%   BMI 42.45 kg/m   Wt Readings from Last 3 Encounters:  11/27/23 263 lb (119.3 kg)  11/20/23 263 lb 3.2 oz (119.4 kg)  11/19/23 263 lb  6.4 oz (119.5 kg)    Physical Exam Vitals and nursing note reviewed.  Constitutional:      General: He is not in acute distress.    Appearance: Normal appearance. He is obese. He is not ill-appearing, toxic-appearing or diaphoretic.  HENT:     Head: Normocephalic and atraumatic.     Right Ear: External ear normal.     Left Ear: External ear normal.     Nose: Nose normal.     Mouth/Throat:     Mouth: Mucous membranes are moist.     Pharynx: Oropharynx is clear.  Eyes:     General: No scleral icterus.       Right eye: No discharge.        Left eye: No discharge.     Extraocular Movements: Extraocular movements intact.     Conjunctiva/sclera: Conjunctivae normal.     Pupils: Pupils are equal, round, and reactive to light.  Cardiovascular:     Rate and Rhythm: Normal rate and regular rhythm.     Pulses: Normal pulses.     Heart sounds: Normal heart sounds. No murmur heard.    No friction rub. No gallop.  Pulmonary:     Effort: Pulmonary effort is normal. No respiratory distress.     Breath sounds: No stridor. Wheezing present. No rhonchi or rales.  Chest:     Chest wall: No tenderness.  Musculoskeletal:        General: Normal range of motion.     Cervical back: Normal range of motion  and neck supple.  Skin:    General: Skin is warm and dry.     Capillary Refill: Capillary refill takes less than 2 seconds.     Coloration: Skin is not jaundiced or pale.     Findings: No bruising, erythema, lesion or rash.  Neurological:     General: No focal deficit present.     Mental Status: He is alert and oriented to person, place, and time. Mental status is at baseline.  Psychiatric:        Mood and Affect: Mood normal.        Behavior: Behavior normal.        Thought Content: Thought content normal.        Judgment: Judgment normal.     Results for orders placed or performed in visit on 11/27/23  CoaguChek XS/INR Waived   Collection Time: 11/27/23  9:23 AM  Result Value Ref Range    INR 2.2 (H) 0.9 - 1.1   Prothrombin Time 26.4 sec   *Note: Due to a large number of results and/or encounters for the requested time period, some results have not been displayed. A complete set of results can be found in Results Review.      Assessment & Plan:   Problem List Items Addressed This Visit       Cardiovascular and Mediastinum   Hypertension associated with diabetes (HCC)   Significantly better on the spironalactone. Continue current regimen. Rechecking BMP. Await results.       Relevant Medications   enoxaparin  (LOVENOX ) 40 MG/0.4ML injection   Other Relevant Orders   Basic metabolic panel with GFR     Other   H/O mechanical aortic valve replacement - Primary   Slightly low today at 2.2- will recheck labs on Thursday and adjust if needed. To start bridging lovenox  next week for colonoscopy- full instructions on AVS. Follow up 6/16.      Relevant Orders   CoaguChek XS/INR Waived (Completed)   Other Visit Diagnoses       Acute on chronic systolic (congestive) heart failure (HCC)   (Chronic)     Significantly better. Less SOB. Less swelling.   Relevant Medications   enoxaparin  (LOVENOX ) 40 MG/0.4ML injection        Follow up plan: Return in about 20 days (around 12/17/2023).

## 2023-11-27 NOTE — Assessment & Plan Note (Signed)
 Slightly low today at 2.2- will recheck labs on Thursday and adjust if needed. To start bridging lovenox  next week for colonoscopy- full instructions on AVS. Follow up 6/16.

## 2023-11-27 NOTE — Patient Instructions (Addendum)
 INR slightly low today- no dose change, do home draw Thursday and I'll let you know Friday  Start lovenox  120mg  every 12 hours- 12/04/23, continue warfarin for 2 days STOP Warfarin until after your colonoscopy 12/06/23 Colonoscopy 12/13/23 Continue lovenox  and restart warfarin at current dose 12/14/23 Come see me on 6/16 and we'll see if we can stop the lovenox 

## 2023-11-27 NOTE — Assessment & Plan Note (Signed)
 Significantly better on the spironalactone. Continue current regimen. Rechecking BMP. Await results.

## 2023-11-27 NOTE — Telephone Encounter (Signed)
 This is appropriate. I agree.

## 2023-11-27 NOTE — Telephone Encounter (Signed)
 Copied from CRM [id number]. Topic: General - Call Back - No Documentation >> Nov 20, 2023  8:48 AM Roberto [name] B wrote: Reason for CRM: [facility] calling they where told to go to patients home to do a retraining on using equipment and when they got to the home the care giver did not allow them in so [facility] will not be giving out anymore than 6 irn meters a month please call [facility] [phone]

## 2023-11-28 ENCOUNTER — Ambulatory Visit: Payer: Self-pay | Admitting: Family Medicine

## 2023-11-28 LAB — BASIC METABOLIC PANEL WITH GFR
BUN/Creatinine Ratio: 18 (ref 10–24)
BUN: 32 mg/dL — ABNORMAL HIGH (ref 8–27)
CO2: 21 mmol/L (ref 20–29)
Calcium: 10 mg/dL (ref 8.6–10.2)
Chloride: 97 mmol/L (ref 96–106)
Creatinine, Ser: 1.74 mg/dL — ABNORMAL HIGH (ref 0.76–1.27)
Glucose: 103 mg/dL — ABNORMAL HIGH (ref 70–99)
Potassium: 4.3 mmol/L (ref 3.5–5.2)
Sodium: 141 mmol/L (ref 134–144)
eGFR: 42 mL/min/{1.73_m2} — ABNORMAL LOW (ref >59)

## 2023-11-28 NOTE — Telephone Encounter (Signed)
 Requested medication (s) are due for refill today: yes  Requested medication (s) are on the active medication list: yes  Last refill:  05/16/23  180 each  Future visit scheduled: yes  Notes to clinic:  medication not assigned to a protocol   Requested Prescriptions  Pending Prescriptions Disp Refills   TRELEGY ELLIPTA  100-62.5-25 MCG/ACT AEPB [Pharmacy Med Name: Trelegy Ellipta  100-62.5-25 MCG/INH Inhalation Aerosol Powder Breath Activated] 180 each 0    Sig: INHALE 1 PUFF ONCE DAILY     Off-Protocol Failed - 11/28/2023  3:33 PM      Failed - Medication not assigned to a protocol, review manually.      Passed - Valid encounter within last 12 months    Recent Outpatient Visits           Yesterday H/O mechanical aortic valve replacement   [place] [facility] [place], [place] P, DO   1 week ago H/O mechanical aortic valve replacement   [place] [facility] [place], [name], DO   1 week ago H/O mechanical aortic valve replacement   [place] [facility] [place], [name], DO   2 weeks ago H/O mechanical aortic valve replacement   [place] [facility] Aileen Alexanders, NP   3 weeks ago H/O mechanical aortic valve replacement   [place] [facility] Hadassah Letters, MD       Future Appointments             In 3 months End, Veryl Gottron, MD [facility] at [facility]

## 2023-11-28 NOTE — Telephone Encounter (Signed)
 I spoke to Mr. Reddin's caregiver.  She advised that they are waiting for the Lovenox  to come in through the pharmacy.  She said that Dr. Anselmo Bast office had advised and explained that he will be taking the medication since he cannot come off of the Warfarin. I told her that if she doesn't here from Dr. Anselmo Bast office to start the Lovenox , she should call us .  I also advised her that a nurse from the hospital will call as it gets closer to the procedure date.

## 2023-11-29 ENCOUNTER — Other Ambulatory Visit: Payer: Self-pay

## 2023-11-29 NOTE — Telephone Encounter (Signed)
 Copied from CRM [id number]. Topic: Clinical - Medication Question >> Nov 27, 2023  2:20 PM Alyse July wrote: Reason for CRM: Pharmacy would like to know if medication enoxaparin  (LOVENOX ) 40 MG/0.4ML injection can be re-written for 120MG  one shot twice a day. Contact number for pharmacy [phone].

## 2023-11-30 ENCOUNTER — Telehealth: Admitting: Family Medicine

## 2023-11-30 ENCOUNTER — Telehealth: Payer: Self-pay

## 2023-11-30 ENCOUNTER — Encounter: Payer: Self-pay | Admitting: Family Medicine

## 2023-11-30 DIAGNOSIS — Z952 Presence of prosthetic heart valve: Secondary | ICD-10-CM | POA: Diagnosis not present

## 2023-11-30 MED ORDER — ENOXAPARIN SODIUM 120 MG/0.8ML IJ SOSY: 120.0000 mg | PREFILLED_SYRINGE | Freq: Two times a day (BID) | INTRAMUSCULAR | 0 refills | Status: DC

## 2023-11-30 MED ORDER — WARFARIN SODIUM 1 MG PO TABS: 1.0000 mg | ORAL_TABLET | Freq: Every day | ORAL | 3 refills | Status: DC

## 2023-11-30 NOTE — Telephone Encounter (Signed)
 Copied from CRM [id number]. Topic: Clinical - Lab/Test Results >> Nov 30, 2023  7:42 AM Rosamond Comes wrote: Reason for CRM:  [name] caregiver for patient reporting INR 2.1 please pass this on to Dr Lincoln Renshaw   And asking to have all medications prescriptions to be sent to [facility]. [facility] texted [name] stating provider needs to send in new prescriptions.  [facility] [address] ([address]), [place] - [address]) [place] [address] Phone: [phone] Fax: [phone]  Lovenox  for colonoscopy has been taken care of. Mental health appt with the [facility]  12/12/23  Please call [name] at [phone] regarding these questions and issues

## 2023-11-30 NOTE — Progress Notes (Signed)
 There were no vitals taken for this visit.   Subjective:    Patient ID: Roberto [name], male    DOB: 1955-09-11, 68 y.o.   MRN: [id number]  CC: Coumadin  management  HPI: This patient is a 68 y.o. male who presents for coumadin  management. The expected duration of coumadin  treatment is lifelong The reason for anticoagulation is  mechanical heart valve.  Present Coumadin  dose: 4mg  daily Goal: 2.5-3.5  Excessive bruising: no Nose bleeding: no Rectal bleeding: no Prolonged menstrual cycles: N/A Eating diet with consistent amounts of foods containing Vitamin K:yes Any recent antibiotic use? yes  Relevant past medical, surgical, family and social history reviewed and updated as indicated. Interim medical history since our last visit reviewed. Allergies and medications reviewed and updated.  Review of Systems  Constitutional: Negative.   Respiratory: Negative.    Cardiovascular: Negative.   Musculoskeletal: Negative.   Skin: Negative.   Psychiatric/Behavioral: Negative.          Objective:    There were no vitals taken for this visit.  Wt Readings from Last 3 Encounters:  11/27/23 263 lb (119.3 kg)  11/20/23 263 lb 3.2 oz (119.4 kg)  11/19/23 263 lb 6.4 oz (119.5 kg)     Physical Exam Constitutional:      General: He is not in acute distress.    Appearance: Normal appearance. He is well-developed. He is obese. He is not ill-appearing, toxic-appearing or diaphoretic.  HENT:     Head: Normocephalic and atraumatic.     Right Ear: Hearing and external ear normal.     Left Ear: Hearing and external ear normal.     Nose: Nose normal.  Eyes:     General: Lids are normal. No scleral icterus.       Right eye: No discharge.        Left eye: No discharge.     Extraocular Movements: Extraocular movements intact.     Conjunctiva/sclera: Conjunctivae normal.     Pupils: Pupils are equal, round, and reactive to light.  Pulmonary:     Effort: Pulmonary effort is normal. No  respiratory distress.  Musculoskeletal:        General: Normal range of motion.     Cervical back: Normal range of motion.  Skin:    Coloration: Skin is not jaundiced or pale.     Findings: No bruising, erythema, lesion or rash.  Neurological:     General: No focal deficit present.     Mental Status: He is alert and oriented to person, place, and time.  Psychiatric:        Mood and Affect: Mood normal.        Speech: Speech normal.        Behavior: Behavior normal.        Thought Content: Thought content normal.        Judgment: Judgment normal.    Last INR: 2.1    Last CBC:  Lab Results  Component Value Date   WBC 7.8 11/15/2023   HGB 14.9 11/15/2023   HCT 46.8 11/15/2023   MCV 91 11/15/2023   PLT 238 11/15/2023    Results for orders placed or performed in visit on 11/27/23  CoaguChek XS/INR Waived   Collection Time: 11/27/23  9:23 AM  Result Value Ref Range   INR 2.2 (H) 0.9 - 1.1   Prothrombin Time 26.4 sec  Basic metabolic panel with GFR   Collection Time: 11/27/23  9:29 AM  Result Value Ref Range  Glucose 103 (H) 70 - 99 mg/dL   BUN 32 (H) 8 - 27 mg/dL   Creatinine, Ser 1.61 (H) 0.76 - 1.27 mg/dL   eGFR 42 (L) >09 UE/AVW/0.98   BUN/Creatinine Ratio 18 10 - 24   Sodium 141 134 - 144 mmol/L   Potassium 4.3 3.5 - 5.2 mmol/L   Chloride 97 96 - 106 mmol/L   CO2 21 20 - 29 mmol/L   Calcium  10.0 8.6 - 10.2 mg/dL   *Note: Due to a large number of results and/or encounters for the requested time period, some results have not been displayed. A complete set of results can be found in Results Review.       Assessment:     ICD-10-CM   1. H/O mechanical aortic valve replacement  Z95.2       Plan:   Discussed current plan face-to-face with patient. For coumadin  dosing, elected to change dose to 4.5mg  daily. Will plan to recheck INR in 5 days.   This visit was completed via video visit through MyChart due to the restrictions of the COVID-19 pandemic. All  issues as above were discussed and addressed. Physical exam was done as above through visual confirmation on video through MyChart. If it was felt that the patient should be evaluated in the office, they were directed there. The patient verbally consented to this visit. Location of the patient: home Location of the provider: work Those involved with this call:  Provider: Terre Ferri, DO CMA: Linda Repress, CMA, Front Desk/Registration: Jaynee Meyer  Time spent on call: 15 minutes with patient face to face via video conference. More than 50% of this time was spent in counseling and coordination of care. 23 minutes total spent in review of patient's record and preparation of their chart.

## 2023-11-30 NOTE — Patient Instructions (Signed)
 Start lovenox  120mg  every 12 hours- 12/04/23, continue warfarin for 2 days STOP Warfarin until after your colonoscopy 12/06/23 Colonoscopy 12/13/23 Continue lovenox  and restart warfarin at current dose 12/14/23 Come see me on 6/16 and we'll see if we can stop the lovenox 

## 2023-11-30 NOTE — Telephone Encounter (Signed)
 Per provider request patient is scheduled for virtual appt this afternoon to discuss INR.

## 2023-12-02 ENCOUNTER — Other Ambulatory Visit: Payer: Self-pay | Admitting: Family Medicine

## 2023-12-03 ENCOUNTER — Telehealth: Payer: Self-pay | Admitting: *Deleted

## 2023-12-03 ENCOUNTER — Encounter: Payer: Self-pay | Admitting: *Deleted

## 2023-12-03 NOTE — Telephone Encounter (Signed)
 Requested Prescriptions  Pending Prescriptions Disp Refills   allopurinol  (ZYLOPRIM ) 300 MG tablet [Pharmacy Med Name: Allopurinol  300 MG Oral Tablet] 90 tablet 0    Sig: Take 1 tablet by mouth once daily     Endocrinology:  Gout Agents - allopurinol  Failed - 12/03/2023  4:20 PM      Failed - Cr in normal range and within 360 days    Creatinine  Date Value Ref Range Status  05/25/2014 0.85 0.60 - 1.30 mg/dL Final   Creatinine, Ser  Date Value Ref Range Status  11/27/2023 1.74 (H) 0.76 - 1.27 mg/dL Final         Passed - Uric Acid in normal range and within 360 days    Uric Acid  Date Value Ref Range Status  09/11/2023 4.2 3.8 - 8.4 mg/dL Final    Comment:               Therapeutic target for gout patients: <6.0         Passed - Valid encounter within last 12 months    Recent Outpatient Visits           3 days ago H/O mechanical aortic valve replacement   [place] [facility] [employer], [name], DO   6 days ago H/O mechanical aortic valve replacement   [place] [facility] [place], [name], DO   1 week ago H/O mechanical aortic valve replacement   [place] [facility] [place], [name], DO   2 weeks ago H/O mechanical aortic valve replacement   [place] [facility] [place], [name], DO   3 weeks ago H/O mechanical aortic valve replacement   [place] [facility] Aileen Alexanders, NP       Future Appointments             In 3 months End, Veryl Gottron, MD [facility] - CBC within normal limits and completed in the last 12 months    WBC  Date Value Ref Range Status  11/15/2023 7.8 3.4 - 10.8 x10E3/uL Final  08/30/2023 8.9 4.0 - 10.5 K/uL Final   RBC  Date Value Ref Range Status  11/15/2023 5.13 4.14 - 5.80 x10E6/uL Final  08/30/2023 4.66 4.22 - 5.81 MIL/uL Final   Hemoglobin  Date Value Ref Range Status  11/15/2023 14.9 13.0 - 17.7 g/dL Final    Hematocrit  Date Value Ref Range Status  11/15/2023 46.8 37.5 - 51.0 % Final   MCHC  Date Value Ref Range Status  11/15/2023 31.8 31.5 - 35.7 g/dL Final  40/98/1191 47.8 30.0 - 36.0 g/dL Final   [facility]  Date Value Ref Range Status  11/15/2023 29.0 26.6 - 33.0 pg Final  08/30/2023 27.9 26.0 - 34.0 pg Final   MCV  Date Value Ref Range Status  11/15/2023 91 79 - 97 fL Final  05/25/2014 88 80 - 100 fL Final   No results found for: "PLTCOUNTKUC", "LABPLAT", "POCPLA" RDW  Date Value Ref Range Status  11/15/2023 16.0 (H) 11.6 - 15.4 % Final  05/25/2014 13.9 11.5 - 14.5 % Final

## 2023-12-04 ENCOUNTER — Encounter: Payer: Self-pay | Admitting: Family Medicine

## 2023-12-04 ENCOUNTER — Ambulatory Visit (INDEPENDENT_AMBULATORY_CARE_PROVIDER_SITE_OTHER): Admitting: Family Medicine

## 2023-12-04 VITALS — BP 110/77 | HR 101 | Ht 66.0 in | Wt 256.0 lb

## 2023-12-04 DIAGNOSIS — Z952 Presence of prosthetic heart valve: Secondary | ICD-10-CM

## 2023-12-04 LAB — COAGUCHEK XS/INR WAIVED
INR: 3.1 — ABNORMAL HIGH (ref 0.9–1.1)
Prothrombin Time: 37.5 s

## 2023-12-04 NOTE — Patient Instructions (Addendum)
 Start lovenox  120mg  every 12 hours- 12/04/23, continue warfarin for 2 days STOP Warfarin until after your colonoscopy 12/06/23 Colonoscopy 12/13/23 Continue lovenox  and restart warfarin at 4.5mg  12/14/23 Come see me on 6/16 and we'll see if we can stop the lovenox 

## 2023-12-04 NOTE — Progress Notes (Signed)
 BP 110/77 (BP Location: Left Arm, Patient Position: Sitting, Cuff Size: Large)   Pulse (!) 101   Ht 5\' 6"  (1.676 m)   Wt 256 lb (116.1 kg)   SpO2 92%   BMI 41.32 kg/m    Subjective:    Patient ID: Roberto [name], male    DOB: 25-Nov-1955, 68 y.o.   MRN: [id number]  CC: Coumadin  management  HPI: This patient is a 68 y.o. male who presents for coumadin  management. The expected duration of coumadin  treatment is lifelong The reason for anticoagulation is  mechanical heart valve.  Present Coumadin  dose: 4.5 Goal: 2.5-3.5  Excessive bruising: no Nose bleeding: no Rectal bleeding: no Prolonged menstrual cycles: N/A Eating diet with consistent amounts of foods containing Vitamin K:yes Any recent antibiotic use? no  Relevant past medical, surgical, family and social history reviewed and updated as indicated. Interim medical history since our last visit reviewed. Allergies and medications reviewed and updated.  Review of Systems  Constitutional: Negative.   Respiratory: Negative.    Cardiovascular: Negative.   Musculoskeletal: Negative.   Neurological: Negative.   Psychiatric/Behavioral: Negative.          Objective:    BP 110/77 (BP Location: Left Arm, Patient Position: Sitting, Cuff Size: Large)   Pulse (!) 101   Ht 5\' 6"  (1.676 m)   Wt 256 lb (116.1 kg)   SpO2 92%   BMI 41.32 kg/m   Wt Readings from Last 3 Encounters:  12/04/23 256 lb (116.1 kg)  11/27/23 263 lb (119.3 kg)  11/20/23 263 lb 3.2 oz (119.4 kg)     Physical Exam Vitals and nursing note reviewed.  Constitutional:      General: He is not in acute distress.    Appearance: Normal appearance. He is obese. He is not ill-appearing, toxic-appearing or diaphoretic.  HENT:     Head: Normocephalic and atraumatic.     Right Ear: External ear normal.     Left Ear: External ear normal.     Nose: Nose normal.     Mouth/Throat:     Mouth: Mucous membranes are moist.     Pharynx: Oropharynx is clear.  Eyes:      General: No scleral icterus.       Right eye: No discharge.        Left eye: No discharge.     Extraocular Movements: Extraocular movements intact.     Conjunctiva/sclera: Conjunctivae normal.     Pupils: Pupils are equal, round, and reactive to light.  Cardiovascular:     Rate and Rhythm: Normal rate and regular rhythm.     Pulses: Normal pulses.     Heart sounds: Normal heart sounds. No murmur heard.    No friction rub. No gallop.  Pulmonary:     Effort: Pulmonary effort is normal. No respiratory distress.     Breath sounds: Normal breath sounds. No stridor. No wheezing, rhonchi or rales.  Chest:     Chest wall: No tenderness.  Musculoskeletal:        General: Normal range of motion.     Cervical back: Normal range of motion and neck supple.  Skin:    General: Skin is warm and dry.     Capillary Refill: Capillary refill takes less than 2 seconds.     Coloration: Skin is not jaundiced or pale.     Findings: No bruising, erythema, lesion or rash.  Neurological:     General: No focal deficit present.  Mental Status: He is alert and oriented to person, place, and time. Mental status is at baseline.  Psychiatric:        Mood and Affect: Mood normal.        Behavior: Behavior normal.        Thought Content: Thought content normal.        Judgment: Judgment normal.      Last INR: 3.1 Last PT: 37.5    Last CBC:  Lab Results  Component Value Date   WBC 7.8 11/15/2023   HGB 14.9 11/15/2023   HCT 46.8 11/15/2023   MCV 91 11/15/2023   PLT 238 11/15/2023    Results for orders placed or performed in visit on 11/27/23  CoaguChek XS/INR Waived   Collection Time: 11/27/23  9:23 AM  Result Value Ref Range   INR 2.2 (H) 0.9 - 1.1   Prothrombin Time 26.4 sec  Basic metabolic panel with GFR   Collection Time: 11/27/23  9:29 AM  Result Value Ref Range   Glucose 103 (H) 70 - 99 mg/dL   BUN 32 (H) 8 - 27 mg/dL   Creatinine, Ser 1.61 (H) 0.76 - 1.27 mg/dL   eGFR 42 (L)  >09 UE/AVW/0.98   BUN/Creatinine Ratio 18 10 - 24   Sodium 141 134 - 144 mmol/L   Potassium 4.3 3.5 - 5.2 mmol/L   Chloride 97 96 - 106 mmol/L   CO2 21 20 - 29 mmol/L   Calcium  10.0 8.6 - 10.2 mg/dL   *Note: Due to a large number of results and/or encounters for the requested time period, some results have not been displayed. A complete set of results can be found in Results Review.       Assessment:     ICD-10-CM   1. H/O mechanical aortic valve replacement  Z95.2 CoaguChek XS/INR Waived      Plan:   Discussed current plan face-to-face with patient. For coumadin  dosing, elected to continue current dose. Will plan to recheck INR in 2 weeks.

## 2023-12-05 ENCOUNTER — Telehealth: Payer: Self-pay | Admitting: Gastroenterology

## 2023-12-05 NOTE — Telephone Encounter (Signed)
 Procedure:Colonoscopy Procedure date: 12/13/23 Procedure location: [facility] Arrival Time: 8:30 am Spoke with the patient Y/N: Yes Any prep concerns? No  Has the patient obtained the prep from the pharmacy ? Yes Do you have a care partner and transportation: Yes Any additional concerns? No

## 2023-12-06 ENCOUNTER — Encounter (HOSPITAL_COMMUNITY): Payer: Self-pay | Admitting: Pediatrics

## 2023-12-06 NOTE — Progress Notes (Signed)
 Attempted to obtain medical history for pre op call via telephone, unable to reach at this time. HIPAA compliant voicemail message left requesting return call to pre surgical testing department.

## 2023-12-07 ENCOUNTER — Telehealth: Payer: Self-pay

## 2023-12-07 NOTE — Telephone Encounter (Signed)
 [facility] Pre-operative Risk Assessment     Request for surgical clearance:     Endoscopy Procedure  What type of surgery is being performed?     Colonoscopy  When is this surgery scheduled?     12/13/23  What type of clearance is required ?   Medical  Are there any medications that need to be held prior to surgery and how long? Cardiology clearance needed for anesthesia  Practice name and name of physician performing surgery?      [facility]  What is your office phone and fax number?      Phone- [phone]  Fax- [phone]  Anesthesia type (None, local, MAC, general) ?       MAC   Please route your response to [employer], CMA

## 2023-12-07 NOTE — Progress Notes (Signed)
 Derron Sigg  PCP-[name] DO Cardiologist-End MD  EKG-09/10/23 Echo-09/10/23 Cath-06/20/23 Stress-n/a ICD/PM-ICD GLP1-Ozempic  last dose 5/2, hold 1 week Blood Thinner- Coumadin  last dose 6/5, lovenox  bridge- not stopping for procedure.   HX: CHF,HTN,Stroke,COPD,CKD3,Aortic valve replacement, DM, Vtach- s/p ICD, Thoracic Aortic Aneurysm,CAD. Patient had ICD placed in December 2024. Per pt and friend who were both on phone hes had a consistant dry cough since the ICD was placed so they referred him to see pulm, but havent been able to see yet, have to wait a while to see them. Last OV with cardiology was 11/16/23, per pt no cardiac/breathing issues, does use walker to get around mostly. Anesthesia Review- Yes- needs medical cardiac clearance, GI office notified 6/6

## 2023-12-11 ENCOUNTER — Ambulatory Visit (INDEPENDENT_AMBULATORY_CARE_PROVIDER_SITE_OTHER): Admitting: Family Medicine

## 2023-12-11 ENCOUNTER — Telehealth: Payer: Self-pay

## 2023-12-11 ENCOUNTER — Encounter: Payer: Self-pay | Admitting: Family Medicine

## 2023-12-11 DIAGNOSIS — Z952 Presence of prosthetic heart valve: Secondary | ICD-10-CM

## 2023-12-11 LAB — COAGUCHEK XS/INR WAIVED
INR: 1.9 — ABNORMAL HIGH (ref 0.9–1.1)
Prothrombin Time: 22.5 s

## 2023-12-11 NOTE — Telephone Encounter (Signed)
 Please see time sensitive request from patient. Please contact patient back with further direction.     Copied from CRM [id number]. Topic: Clinical - Medical Advice >> Dec 11, 2023 10:43 AM Chrystal Crape R wrote: Pt is having a colonoscopy Thursday and his INR is over 8 so his wife would like to know if there is anything they need to do before Thursday. Please call them and advise of next step.

## 2023-12-11 NOTE — Telephone Encounter (Signed)
 Received call from [facility]. Relayed message from Dr Lincoln Renshaw "needs to come in now. May need to go to ER". She began screaming at [employer]. "When were they gonna do it, wait til 6pm when yall go home and now say oh  you can go to the ER cause we are closed". She states last time the patient was sent to the ER and hospitalized for 14 days when he was taken off his Coumadin . Called [facility] and spoke with staff member, Iris, states patient can come straight to clinic.

## 2023-12-11 NOTE — Telephone Encounter (Signed)
 Needs to come in now. May need to go to ER

## 2023-12-11 NOTE — Progress Notes (Signed)
 There were no vitals taken for this visit.   Subjective:    Patient ID: Roberto [name], male    DOB: 1956-02-18, 68 y.o.   MRN: [id number]  CC: Coumadin  management  HPI: This patient is a 68 y.o. male who presents for coumadin  management. The expected duration of coumadin  treatment is lifelong The reason for anticoagulation is  mechanical heart valve.  Present Coumadin  dose: None right now- on lovenox  Goal: 2.5-3.5 (currently 1 for colonoscopy) Excessive bruising: no Nose bleeding: no Rectal bleeding: no Prolonged menstrual cycles: N/A Eating diet with consistent amounts of foods containing Vitamin K:no Any recent antibiotic use? no  Relevant past medical, surgical, family and social history reviewed and updated as indicated. Interim medical history since our last visit reviewed. Allergies and medications reviewed and updated.  Review of Systems  Constitutional: Negative.   Respiratory: Negative.    Cardiovascular: Negative.   Musculoskeletal: Negative.   Psychiatric/Behavioral: Negative.          Objective:    There were no vitals taken for this visit.  Wt Readings from Last 3 Encounters:  12/04/23 256 lb (116.1 kg)  11/27/23 263 lb (119.3 kg)  11/20/23 263 lb 3.2 oz (119.4 kg)     Physical Exam Vitals and nursing note reviewed.  Constitutional:      General: He is not in acute distress.    Appearance: Normal appearance. He is normal weight. He is not ill-appearing, toxic-appearing or diaphoretic.  HENT:     Head: Normocephalic and atraumatic.     Right Ear: External ear normal.     Left Ear: External ear normal.     Nose: Nose normal.     Mouth/Throat:     Mouth: Mucous membranes are moist.     Pharynx: Oropharynx is clear. No oropharyngeal exudate or posterior oropharyngeal erythema.  Cardiovascular:     Rate and Rhythm: Normal rate and regular rhythm.     Pulses: Normal pulses.     Heart sounds: Normal heart sounds. No murmur heard.    No friction rub.  No gallop.  Pulmonary:     Effort: Pulmonary effort is normal. No respiratory distress.     Breath sounds: Normal breath sounds. No stridor. No wheezing, rhonchi or rales.  Chest:     Chest wall: No tenderness.  Musculoskeletal:        General: No swelling, tenderness, deformity or signs of injury. Normal range of motion.     Right lower leg: No edema.     Left lower leg: No edema.  Skin:    General: Skin is warm and dry.     Capillary Refill: Capillary refill takes less than 2 seconds.     Coloration: Skin is not jaundiced or pale.     Findings: No bruising, erythema, lesion or rash.  Neurological:     General: No focal deficit present.     Mental Status: He is alert and oriented to person, place, and time. Mental status is at baseline.     Cranial Nerves: No cranial nerve deficit.     Sensory: No sensory deficit.     Motor: No weakness.     Coordination: Coordination normal.     Gait: Gait normal.     Deep Tendon Reflexes: Reflexes normal.  Psychiatric:        Mood and Affect: Mood normal.        Behavior: Behavior normal.        Thought Content: Thought content normal.  Judgment: Judgment normal.     Last INR: 1.9 Last PT: 22.5    Last CBC:  Lab Results  Component Value Date   WBC 7.8 11/15/2023   HGB 14.9 11/15/2023   HCT 46.8 11/15/2023   MCV 91 11/15/2023   PLT 238 11/15/2023    Results for orders placed or performed in visit on 12/11/23  CoaguChek XS/INR Waived   Collection Time: 12/11/23  3:58 PM  Result Value Ref Range   INR 1.9 (H) 0.9 - 1.1   Prothrombin Time 22.5 sec   *Note: Due to a large number of results and/or encounters for the requested time period, some results have not been displayed. A complete set of results can be found in Results Review.       Assessment:     ICD-10-CM   1. H/O mechanical aortic valve replacement  Z95.2 CoaguChek XS/INR Waived   INR 1.9. Home reading likely inaccurate. Will remain off coumadin  until after  colonoscopy. Continue bridging with lovenox . Recheck 6/16 as scheduled.      Plan:   Discussed current plan face-to-face with patient. For coumadin  dosing, elected to continue current dose. Will plan to recheck INR in as scheduled.

## 2023-12-12 NOTE — Telephone Encounter (Signed)
   Primary Cardiologist: Sammy Crisp, MD  Chart reviewed as part of pre-operative protocol coverage. Given past medical history and time since last visit, based on ACC/AHA guidelines, Roberto [name] would be at acceptable risk for the planned procedure without further cardiovascular testing.   I will route this recommendation to the requesting party via Epic fax function and remove from pre-op  pool.  Please call with questions.  [name], NP-C 12/12/2023, 7:54 AM [address], [address] [place], [place] [address] Office [phone] Fax [phone]

## 2023-12-13 ENCOUNTER — Encounter (HOSPITAL_COMMUNITY)
Admission: RE | Disposition: A | Discharge status: Home / Self Care | Payer: Self-pay | Source: Home / Self Care | Attending: Pediatrics

## 2023-12-13 ENCOUNTER — Ambulatory Visit (HOSPITAL_COMMUNITY): Admitting: Anesthesiology

## 2023-12-13 ENCOUNTER — Ambulatory Visit (HOSPITAL_COMMUNITY)
Admission: RE | Admit: 2023-12-13 | Discharge: 2023-12-13 | Disposition: A | Discharge status: Home / Self Care | Attending: Pediatrics | Admitting: Pediatrics

## 2023-12-13 ENCOUNTER — Other Ambulatory Visit: Payer: Self-pay

## 2023-12-13 ENCOUNTER — Encounter (HOSPITAL_COMMUNITY): Payer: Self-pay | Admitting: Pediatrics

## 2023-12-13 DIAGNOSIS — E1122 Type 2 diabetes mellitus with diabetic chronic kidney disease: Secondary | ICD-10-CM | POA: Diagnosis not present

## 2023-12-13 DIAGNOSIS — Z952 Presence of prosthetic heart valve: Secondary | ICD-10-CM | POA: Diagnosis not present

## 2023-12-13 DIAGNOSIS — I083 Combined rheumatic disorders of mitral, aortic and tricuspid valves: Secondary | ICD-10-CM | POA: Diagnosis not present

## 2023-12-13 DIAGNOSIS — N183 Chronic kidney disease, stage 3 unspecified: Secondary | ICD-10-CM | POA: Insufficient documentation

## 2023-12-13 DIAGNOSIS — Z800 Family history of malignant neoplasm of digestive organs: Secondary | ICD-10-CM | POA: Insufficient documentation

## 2023-12-13 DIAGNOSIS — I5022 Chronic systolic (congestive) heart failure: Secondary | ICD-10-CM | POA: Insufficient documentation

## 2023-12-13 DIAGNOSIS — I2510 Atherosclerotic heart disease of native coronary artery without angina pectoris: Secondary | ICD-10-CM | POA: Diagnosis not present

## 2023-12-13 DIAGNOSIS — K648 Other hemorrhoids: Secondary | ICD-10-CM | POA: Insufficient documentation

## 2023-12-13 DIAGNOSIS — D123 Benign neoplasm of transverse colon: Secondary | ICD-10-CM | POA: Diagnosis not present

## 2023-12-13 DIAGNOSIS — Z79899 Other long term (current) drug therapy: Secondary | ICD-10-CM | POA: Insufficient documentation

## 2023-12-13 DIAGNOSIS — Z1211 Encounter for screening for malignant neoplasm of colon: Secondary | ICD-10-CM | POA: Diagnosis present

## 2023-12-13 DIAGNOSIS — F419 Anxiety disorder, unspecified: Secondary | ICD-10-CM | POA: Diagnosis not present

## 2023-12-13 DIAGNOSIS — I130 Hypertensive heart and chronic kidney disease with heart failure and stage 1 through stage 4 chronic kidney disease, or unspecified chronic kidney disease: Secondary | ICD-10-CM | POA: Diagnosis not present

## 2023-12-13 DIAGNOSIS — J449 Chronic obstructive pulmonary disease, unspecified: Secondary | ICD-10-CM | POA: Insufficient documentation

## 2023-12-13 DIAGNOSIS — Z833 Family history of diabetes mellitus: Secondary | ICD-10-CM | POA: Insufficient documentation

## 2023-12-13 DIAGNOSIS — F32A Depression, unspecified: Secondary | ICD-10-CM | POA: Diagnosis not present

## 2023-12-13 DIAGNOSIS — Z7984 Long term (current) use of oral hypoglycemic drugs: Secondary | ICD-10-CM | POA: Diagnosis not present

## 2023-12-13 DIAGNOSIS — Z8673 Personal history of transient ischemic attack (TIA), and cerebral infarction without residual deficits: Secondary | ICD-10-CM | POA: Insufficient documentation

## 2023-12-13 DIAGNOSIS — K635 Polyp of colon: Secondary | ICD-10-CM

## 2023-12-13 DIAGNOSIS — F17210 Nicotine dependence, cigarettes, uncomplicated: Secondary | ICD-10-CM | POA: Diagnosis not present

## 2023-12-13 DIAGNOSIS — K621 Rectal polyp: Secondary | ICD-10-CM | POA: Diagnosis not present

## 2023-12-13 DIAGNOSIS — R195 Other fecal abnormalities: Secondary | ICD-10-CM | POA: Insufficient documentation

## 2023-12-13 DIAGNOSIS — Z7901 Long term (current) use of anticoagulants: Secondary | ICD-10-CM | POA: Diagnosis not present

## 2023-12-13 DIAGNOSIS — M1990 Unspecified osteoarthritis, unspecified site: Secondary | ICD-10-CM | POA: Insufficient documentation

## 2023-12-13 HISTORY — PX: POLYPECTOMY: SHX149

## 2023-12-13 HISTORY — PX: COLONOSCOPY: SHX5424

## 2023-12-13 LAB — POCT I-STAT, CHEM 8
BUN: 18 mg/dL (ref 8–23)
Calcium, Ion: 0.92 mmol/L — ABNORMAL LOW (ref 1.15–1.40)
Chloride: 106 mmol/L (ref 98–111)
Creatinine, Ser: 1.4 mg/dL — ABNORMAL HIGH (ref 0.61–1.24)
Glucose, Bld: 93 mg/dL (ref 70–99)
HCT: 40 % (ref 39.0–52.0)
Hemoglobin: 13.6 g/dL (ref 13.0–17.0)
Potassium: 4.6 mmol/L (ref 3.5–5.1)
Sodium: 133 mmol/L — ABNORMAL LOW (ref 135–145)
TCO2: 21 mmol/L — ABNORMAL LOW (ref 22–32)

## 2023-12-13 LAB — GLUCOSE, CAPILLARY: Glucose-Capillary: 100 mg/dL — ABNORMAL HIGH (ref 70–99)

## 2023-12-13 SURGERY — COLONOSCOPY
Anesthesia: Monitor Anesthesia Care

## 2023-12-13 MED ORDER — PHENYLEPHRINE 80 MCG/ML (10ML) SYRINGE FOR IV PUSH (FOR BLOOD PRESSURE SUPPORT)
PREFILLED_SYRINGE | INTRAVENOUS | Status: DC | PRN
  Administered 2023-12-13: 80 ug via INTRAVENOUS

## 2023-12-13 MED ORDER — LIDOCAINE 2% (20 MG/ML) 5 ML SYRINGE
INTRAMUSCULAR | Status: DC | PRN
  Administered 2023-12-13: 60 mg via INTRAVENOUS

## 2023-12-13 MED ORDER — SODIUM CHLORIDE 0.9 % IV SOLN: INTRAVENOUS | Status: DC

## 2023-12-13 MED ORDER — PHENYLEPHRINE HCL (PRESSORS) 10 MG/ML IV SOLN
INTRAVENOUS | Status: AC
  Filled 2023-12-13: qty 1

## 2023-12-13 MED ORDER — SODIUM CHLORIDE 0.9% FLUSH: 3.0000 mL | INTRAVENOUS | Status: DC | PRN

## 2023-12-13 MED ORDER — PROPOFOL 10 MG/ML IV BOLUS
INTRAVENOUS | Status: DC | PRN
  Administered 2023-12-13: 150 ug/kg/min via INTRAVENOUS

## 2023-12-13 MED ORDER — EPHEDRINE SULFATE-NACL 50-0.9 MG/10ML-% IV SOSY
PREFILLED_SYRINGE | INTRAVENOUS | Status: DC | PRN
  Administered 2023-12-13 (×3): 5 mg via INTRAVENOUS

## 2023-12-13 MED ORDER — SODIUM CHLORIDE 0.9% FLUSH: 3.0000 mL | Freq: Two times a day (BID) | INTRAVENOUS | Status: DC

## 2023-12-13 MED ORDER — VASOPRESSIN 20 UNIT/ML IV SOLN
INTRAVENOUS | Status: AC
Start: 2023-12-13 — End: 2023-12-13
  Filled 2023-12-13: qty 1

## 2023-12-13 MED ORDER — PROPOFOL 1000 MG/100ML IV EMUL
INTRAVENOUS | Status: AC
  Filled 2023-12-13: qty 100

## 2023-12-13 NOTE — Anesthesia Postprocedure Evaluation (Signed)
 Anesthesia Post Note  Patient: Roberto [name]  Procedure(s) Performed: COLONOSCOPY POLYPECTOMY, INTESTINE     Patient location during evaluation: PACU Anesthesia Type: MAC Level of consciousness: awake and alert Pain management: pain level controlled Vital Signs Assessment: post-procedure vital signs reviewed and stable Respiratory status: spontaneous breathing, nonlabored ventilation, respiratory function stable and patient connected to nasal cannula oxygen Cardiovascular status: blood pressure returned to baseline and stable Postop Assessment: no apparent nausea or vomiting Anesthetic complications: no   No notable events documented.  Last Vitals:  Vitals:   12/13/23 1125 12/13/23 1130  BP:  (!) 105/59  Pulse: (!) 51 (!) 57  Resp: 15 18  Temp:    SpO2: 99% 97%    Last Pain:  Vitals:   12/13/23 1130  TempSrc:   PainSc: 0-No pain                 Leslye Rast

## 2023-12-13 NOTE — Transfer of Care (Signed)
 Immediate Anesthesia Transfer of Care Note  Patient: Roberto [name]  Procedure(s) Performed: COLONOSCOPY POLYPECTOMY, INTESTINE  Patient Location: PACU and Endoscopy Unit  Anesthesia Type:MAC  Level of Consciousness: sedated and responds to stimulation  Airway & Oxygen Therapy: Patient Spontanous Breathing and Patient connected to nasal cannula oxygen  Post-op Assessment: Report given to RN and Post -op Vital signs reviewed and stable  Post vital signs: Reviewed and stable  Last Vitals:  Vitals Value Taken Time  BP 120/68   Temp 36.8   Pulse 78   Resp 20   SpO2 100     Last Pain:  Vitals:   12/13/23 0849  TempSrc: Temporal  PainSc: 0-No pain         Complications: No notable events documented.

## 2023-12-13 NOTE — Op Note (Signed)
 [facility] Patient Name: Roberto [name] Procedure Date: 12/13/2023 MRN: [id number] Attending MD: Eugenia Hess , MD, [id number] Date of Birth: 1955/10/28 CSN: [id number] Age: 68 Admit Type: Outpatient Procedure:                Colonoscopy Indications:              This is the patient's first colonoscopy, Positive                            Cologuard test, Patient reports a family history of                            colorectal cancer in his mother and father after                            the age of 61 Providers:                Eugenia Hess, MD, Suzann Ernst, RN, Alfredia Ina, Technician Referring MD:              Medicines:                Monitored Anesthesia Care Complications:            No immediate complications. Estimated blood loss:                            Minimal. Estimated Blood Loss:     Estimated blood loss was minimal. Procedure:                Pre-Anesthesia Assessment:                           - Prior to the procedure, a History and Physical                            was performed, and patient medications and                            allergies were reviewed. The patient's tolerance of                            previous anesthesia was also reviewed. The risks                            and benefits of the procedure and the sedation                            options and risks were discussed with the patient.                            All questions were answered, and informed consent                            was  obtained. Prior Anticoagulants: The patient has                            taken Coumadin  (warfarin), last dose was 5 days                            prior to procedure. Last dose of Lovenox  was                            administered 5 hours prior to procedure. . ASA                            Grade Assessment: III - A patient with severe                            systemic disease. After reviewing  the risks and                            benefits, the patient was deemed in satisfactory                            condition to undergo the procedure.                           After obtaining informed consent, the colonoscope                            was passed under direct vision. Throughout the                            procedure, the patient's blood pressure, pulse, and                            oxygen saturations were monitored continuously. The                            CF-HQ190L ([id number]) Olympus colonoscope was                            introduced through the anus and advanced to the                            cecum, identified by appendiceal orifice and                            ileocecal valve. The colonoscopy was performed                            without difficulty. The patient tolerated the                            procedure well. The quality of the bowel  preparation was good. The ileocecal valve,                            appendiceal orifice, and rectum were photographed. Scope In: 10:40:36 AM Scope Out: 11:06:18 AM Scope Withdrawal Time: 0 hours 16 minutes 32 seconds  Total Procedure Duration: 0 hours 25 minutes 42 seconds  Findings:      The perianal and digital rectal examinations were normal. Pertinent       negatives include normal sphincter tone and no palpable rectal lesions.      A 4 mm polyp was found in the transverse colon. The polyp was sessile.       The polyp was removed with a cold biopsy forceps. Resection and       retrieval were complete.      A 5 mm polyp was found in the rectum. The polyp was sessile. The polyp       was removed with a hot snare given recent anticoagulation. Resection and       retrieval were complete.      Internal hemorrhoids were found during retroflexion. Impression:               - One 4 mm polyp in the transverse colon, removed                            with a cold biopsy forceps. Resected  and retrieved.                           - One 5 mm polyp in the rectum, removed with a hot                            snare. Resected and retrieved.                           - Internal hemorrhoids. Moderate Sedation:      Not Applicable - Patient had care per Anesthesia. Recommendation:           - Discharge patient to home.                           - Await pathology results.                           - Repeat colonoscopy for surveillance based on                            pathology results.                           - Resume anticoagulation schedule as outlined by                            PCP.                           - The findings and recommendations were discussed  with the patient.                           - Return to referring physician. Procedure Code(s):        --- Professional ---                           [phone], Colonoscopy, flexible; with removal of                            tumor(s), polyp(s), or other lesion(s) by snare                            technique                           45380, 59, Colonoscopy, flexible; with biopsy,                            single or multiple Diagnosis Code(s):        --- Professional ---                           D12.3, Benign neoplasm of transverse colon (hepatic                            flexure or splenic flexure)                           D12.8, Benign neoplasm of rectum                           K64.8, Other hemorrhoids                           R19.5, Other fecal abnormalities CPT copyright 2022 American [employer] Association. All rights reserved. The codes documented in this report are preliminary and upon coder review may  be revised to meet current compliance requirements. Eugenia Hess, MD 12/13/2023 11:11:53 AM This report has been signed electronically. Number of Addenda: 0

## 2023-12-13 NOTE — Anesthesia Preprocedure Evaluation (Addendum)
 Anesthesia Evaluation  Patient identified by MRN, date of birth, ID band Patient awake    Reviewed: Allergy & Precautions, NPO status , Patient's Chart, lab work & pertinent test results, reviewed documented beta blocker date and time   History of Anesthesia Complications Negative for: history of anesthetic complications  Airway Mallampati: II  TM Distance: >3 FB     Dental  (+) Missing, Poor Dentition   Pulmonary COPD, Current Smoker   breath sounds clear to auscultation       Cardiovascular hypertension, + CAD and +CHF  + pacemaker + Valvular Problems/Murmurs AS and MR  Rhythm:Regular Rate:Normal + Diastolic murmurs 1. Left ventricular ejection fraction, by estimation, is 25 to 30%. Left  ventricular ejection fraction by 3D volume is 28 %. The left ventricle has  severely decreased function. The left ventricle demonstrates global  hypokinesis. The left ventricular  internal cavity size was severely dilated. Left ventricular diastolic  parameters are consistent with Grade II diastolic dysfunction  (pseudonormalization). The average left ventricular global longitudinal  strain is -10.5 %. The global longitudinal strain  is abnormal.   2. Right ventricular systolic function is mildly reduced. The right  ventricular size is normal.   3. Left atrial size was mild to moderately dilated.   4. Right atrial size was moderately dilated.   5. The mitral valve is degenerative. Mild to moderate mitral valve  regurgitation. No evidence of mitral stenosis.   6. Tricuspid valve regurgitation is mild to moderate.   7. The aortic valve has been repaired/replaced. Aortic valve  regurgitation versus perivalvular leak is trivial. Mild to moderate aortic  valve stenosis with at least some component of patient-prosthesis  mismatch. Aortic valve mean gradient measures 12.6  mmHg.   8. There is mild dilatation of the aortic root, measuring 43 mm.  Known  severe dilation of the ascending aorta is not well-visualized on this  examination.     Neuro/Psych  PSYCHIATRIC DISORDERS Anxiety Depression    TIA Neuromuscular disease CVA    GI/Hepatic   Endo/Other  diabetes, Type 2    Renal/GU CRFRenal disease     Musculoskeletal  (+) Arthritis ,    Abdominal   Peds  Hematology   Anesthesia Other Findings   Reproductive/Obstetrics                             Anesthesia Physical Anesthesia Plan  ASA: 4  Anesthesia Plan: MAC   Post-op Pain Management:    Induction: Intravenous  PONV Risk Score and Plan: 1 and Ondansetron   Airway Management Planned: Natural Airway and Simple Face Mask  Additional Equipment:   Intra-op Plan:   Post-operative Plan:   Informed Consent: I have reviewed the patients History and Physical, chart, labs and discussed the procedure including the risks, benefits and alternatives for the proposed anesthesia with the patient or authorized representative who has indicated his/her understanding and acceptance.     Dental advisory given  Plan Discussed with: CRNA  Anesthesia Plan Comments:        Anesthesia Quick Evaluation

## 2023-12-13 NOTE — Discharge Instructions (Signed)

## 2023-12-13 NOTE — H&P (Signed)
 [facility] History and Physical   Primary Care Physician:  [name], DO   Reason for Procedure:  Positive Cologuard  Plan:    Colonoscopy     HPI: Brailon [name] is a 68 y.o. male undergoing colonoscopy for investigation of positive Cologuard obtained in November 2024.  He reports a family history of colorectal cancer in his mother and father after the age of 54.  No family history of colon polyps.  Denies change in bowel habits or rectal bleeding at the time of this exam.  Due to cardiac history he is on Coumadin  and Lovenox  bridge.  Last dose of Lovenox  was administered at 530 this morning.  Discussed that if there are large polyps these will be documented but not removed.   Past Medical History:  Diagnosis Date   Aneurysm (HCC)    Bicuspid aortic valve    a. s/p #27 [employer]omed[employer] mechanical valve on 03/25/2010; b. on Coumadin ; c. TTE 12/17: EF 40-45%, moderately dilated LV with moderate LVH, AVR well-seated with 14 mmHg gradient, peak AV velocity 2.5 m/s, mild mitral valve thickening with mild MR, mildly dilated RV with mildly reduced contraction   Cellulitis    CHF (congestive heart failure) (HCC)    Chronic kidney disease    Chronic systolic CHF (congestive heart failure) (HCC)    a. R/LHC 03/2010 showed no significant CAD, LVEDP 31 mmHg, mean AoV gradient 34 mmHg at rest and 47 mmHg with dobutamine 20 mcg/kg/min, AVA 1.0 cm^2, RA 31, RV 68/25, PA 68/47, PCWP 38. PA sat 65%. CO 6.2 L/min (Fick) and 5.3 L/min (thermodilution)   Clotting disorder (HCC)    COPD (chronic obstructive pulmonary disease) (HCC)    H/O mechanical aortic valve replacement 03/25/2010   a. #27 Carbomed[employer] mechanical valve   Hearing loss    High cholesterol    HTN (hypertension)    Hypercholesterolemia    Renal infarct [facility]) 2017   Multiple right renal infarcts, likely embolic.   Stage 3 chronic kidney disease (HCC)    Stroke [facility])    TIA (transient ischemic attack) 05/2014    Past  Surgical History:  Procedure Laterality Date   AORTIC VALVE REPLACEMENT     AORTIC VALVE SURGERY     CARDIAC CATHETERIZATION  03/21/2010   No significant CAD. Severe aortic stenosis. Severely elevated left and right heart filling pressures.   CARDIAC SURGERY  2009   CHF   CARDIOVERSION N/A 08/28/2023   Procedure: CARDIOVERSION;  Surgeon: Sheryle Donning, MD;  Location: [facility] INVASIVE CV LAB;  Service: Cardiovascular;  Laterality: N/A;   CARPAL TUNNEL RELEASE Left 2005   ICD IMPLANT N/A 06/25/2023   Procedure: ICD IMPLANT;  Surgeon: Tammie Fall, MD;  Location: [facility] INVASIVE CV LAB;  Service: Cardiovascular;  Laterality: N/A;   PACEMAKER IMPLANT     RIGHT HEART CATH AND CORONARY ANGIOGRAPHY N/A 01/28/2019   Procedure: RIGHT HEART CATH AND CORONARY ANGIOGRAPHY;  Surgeon: Sammy Crisp, MD;  Location: [facility] INVASIVE CV LAB;  Service: Cardiovascular;  Laterality: N/A;   RIGHT HEART CATH AND CORONARY ANGIOGRAPHY N/A 06/20/2023   Procedure: RIGHT HEART CATH AND CORONARY ANGIOGRAPHY;  Surgeon: Sammy Crisp, MD;  Location: [facility] INVASIVE CV LAB;  Service: Cardiovascular;  Laterality: N/A;   TONSILLECTOMY  1962    Prior to Admission medications   Medication Sig Start Date End Date Taking? Authorizing Provider  acetaminophen  (TYLENOL ) 500 MG tablet Take 1,000 mg by mouth every 6 (six) hours as needed for moderate pain (pain score  4-6).    [provider]  albuterol  (PROVENTIL ) (2.5 MG/3ML) 0.083% nebulizer solution USE 1 VIAL IN NEBULIZER EVERY 6 HOURS AS NEEDED FOR WHEEZING FOR SHORTNESS OF BREATH 09/25/23   Terre Ferri P, DO  allopurinol  (ZYLOPRIM ) 300 MG tablet Take 1 tablet by mouth once daily 12/03/23   Lincoln Renshaw, [name], DO  amiodarone  (PACERONE ) 200 MG tablet Take 200 mg by mouth daily.    [provider]  aspirin  81 MG chewable tablet Chew 81 mg by mouth in the morning.    [provider]  buPROPion  (WELLBUTRIN  XL) 300 MG 24 hr tablet Take 300 mg by mouth  daily. 08/16/23   [provider]  dapagliflozin  propanediol (FARXIGA ) 10 MG TABS tablet Take 1 tablet (10 mg total) by mouth daily before breakfast. 07/05/23   Charlette Console, FNP  enoxaparin  (LOVENOX ) 120 MG/0.8ML injection Inject 0.8 mLs (120 mg total) into the skin every 12 (twelve) hours. Start 6/3 and continue until you see me 6/16 Patient not taking: Reported on 11/30/2023 11/30/23   Terre Ferri P, DO  fenofibrate  (TRICOR ) 48 MG tablet Take 1 tablet (48 mg total) by mouth daily. 12/25/22   End, Veryl Gottron, MD  losartan  (COZAAR ) 25 MG tablet Take 1 tablet (25 mg total) by mouth daily. 09/27/23 12/26/23  Alwin Baars, DO  metFORMIN  (GLUCOPHAGE -XR) 500 MG 24 hr tablet Take 1 tablet (500 mg total) by mouth daily with breakfast. 05/16/23   Terre Ferri P, DO  nortriptyline  (PAMELOR ) 10 MG capsule Take 1 capsule by mouth at bedtime 11/12/23   Terre Ferri P, DO  nystatin  (MYCOSTATIN ) 100000 UNIT/ML suspension 5mL swish and spit TID 11/15/23   [name], [name], DO  Omega-3 Fatty Acids (OMEGA 3 PO) Take 1,500 mg by mouth 2 (two) times daily.    [provider]  OZEMPIC , 1 MG/DOSE, 4 MG/3ML SOPN Inject 1 mg into the skin once a week. On Mondays 07/23/23   [provider]  potassium chloride  SA (KLOR-CON  M) 20 MEQ tablet Take 20 mEq by mouth 2 (two) times daily.    [provider]  rosuvastatin  (CRESTOR ) 40 MG tablet Take 1 tablet (40 mg total) by mouth daily. 04/06/23   Sammy Crisp, MD  Spacer/Aero-Holding [facility] Use as directed Dx: COPD, J44.9 07/13/23   Terre Ferri P, DO  spironolactone  (ALDACTONE ) 25 MG tablet Take 1 tablet (25 mg total) by mouth daily. 11/23/23   Charlette Console, FNP  torsemide  (DEMADEX ) 20 MG tablet Take 2 tablets by mouth once daily 11/05/23   Gollan, Timothy J, MD  TRELEGY ELLIPTA  100-62.5-25 MCG/ACT AEPB INHALE 1 PUFF ONCE DAILY 11/29/23   [name], [name], DO  VENTOLIN  HFA 108 (90 Base) MCG/ACT inhaler INHALE 2 PUFFS BY MOUTH  EVERY 6 HOURS AS NEEDED FOR WHEEZING OR SHORTNESS OF BREATH 05/16/23   [name], [name], DO  warfarin (COUMADIN ) 1 MG tablet Take 1 tablet (1 mg total) by mouth daily. 11/30/23   [name], [name], DO  warfarin (COUMADIN ) 3 MG tablet Take 1 tablet (3 mg total) by mouth daily. 09/11/23   [name], DO    Current Facility-Administered Medications  Medication Dose Route Frequency Provider Last Rate Last Admin   0.9 %  sodium chloride  infusion   Intravenous Continuous Yvone Herd Scarlette Currier, MD 40 mL/hr at 12/13/23 0911 New Bag at 12/13/23 0911   sodium chloride  flush (NS) 0.9 % injection 3-10 mL  3-10 mL Intravenous Q12H Meli Faley, Scarlette Currier, MD  sodium chloride  flush (NS) 0.9 % injection 3-10 mL  3-10 mL Intravenous PRN Brook Geraci, Scarlette Currier, MD        Allergies as of 10/10/2023 - Review Complete 10/10/2023  Allergen Reaction Noted   Nicotine   08/08/2023    Family History  Problem Relation Age of Onset   Arthritis Mother    Dementia Mother    Colon cancer Mother    Arthritis Father    Diabetes Father    Stroke Father    Colon cancer Father    Heart attack Brother    Breast cancer Sister    Seizures Sister    Cancer Brother        brain   Heart disease Brother    Heart attack Brother     Social History   Socioeconomic History   Marital status: Divorced    Spouse name: Not on file   Number of children: 1   Years of education: 14   Highest education level: Associate degree: academic program  Occupational History   Occupation: Disabled    Employer: UNEMPLOYED  Tobacco Use   Smoking status: Every Day    Current packs/day: 0.20    Average packs/day: 0.5 packs/day for 47.9 years (23.7 ttl pk-yrs)    Types: Cigarettes    Start date: 1974    Last attempt to quit: 2021   Smokeless tobacco: Never   Tobacco comments:    3 or less cigarettes per day  Vaping Use   Vaping status: Never Used  Substance and Sexual Activity   Alcohol use: Never   Drug use: Never   Sexual activity:  Not Currently  Other Topics Concern   Not on file  Social History Narrative   Not on file   Social Drivers of Health   Financial Resource Strain: Medium Risk (09/04/2023)   Overall Financial Resource Strain (CARDIA)    Difficulty of Paying Living Expenses: Somewhat hard  Food Insecurity: No Food Insecurity (10/24/2023)   Hunger Vital Sign    Worried About Running Out of Food in the Last Year: Never true    Ran Out of Food in the Last Year: Never true  Recent Concern: Food Insecurity - Food Insecurity Present (09/03/2023)   Hunger Vital Sign    Worried About Running Out of Food in the Last Year: Sometimes true    Ran Out of Food in the Last Year: Sometimes true  Transportation Needs: No Transportation Needs (10/24/2023)   PRAPARE - Administrator, Civil Service (Medical): No    Lack of Transportation (Non-Medical): No  Physical Activity: Inactive (09/04/2023)   Exercise Vital Sign    Days of Exercise per Week: 0 days    Minutes of Exercise per Session: 0 min  Stress: Stress Concern Present (09/04/2023)   [employer] of Occupational Health - Occupational Stress Questionnaire    Feeling of Stress : To some extent  Social Connections: Socially Isolated (10/19/2023)   Social Connection and Isolation Panel    Frequency of Communication with Friends and Family: More than three times a week    Frequency of Social Gatherings with Friends and Family: More than three times a week    Attends Religious Services: Never    Database administrator or Organizations: No    Attends Banker Meetings: Never    Marital Status: Divorced  Catering manager Violence: Not At Risk (10/24/2023)   Humiliation, Afraid, Rape, and Kick questionnaire    Fear of Current or  Ex-Partner: No    Emotionally Abused: No    Physically Abused: No    Sexually Abused: No    Review of Systems:  All other review of systems negative except as mentioned in the HPI.  Physical Exam: Vital signs BP  129/74   Pulse 72   Temp 97.9 F (36.6 C) (Temporal)   Resp 20   Ht 5' 6 (1.676 m)   Wt 116.1 kg   SpO2 100%   BMI 41.31 kg/m   General:   Alert,  Well-developed, well-nourished, pleasant and cooperative in NAD Lungs:  Clear throughout to auscultation.   Heart:  Regular rate and rhythm; no murmurs, clicks, rubs,  or gallops. Abdomen:  Soft, nontender and nondistended. Normal bowel sounds.   Neuro/Psych:  Normal mood and affect. A and O x 3  Eugenia Hess, MD [facility]

## 2023-12-14 LAB — SURGICAL PATHOLOGY

## 2023-12-15 ENCOUNTER — Ambulatory Visit: Payer: Self-pay | Admitting: Pediatrics

## 2023-12-16 ENCOUNTER — Encounter (HOSPITAL_COMMUNITY): Payer: Self-pay | Admitting: Pediatrics

## 2023-12-17 ENCOUNTER — Ambulatory Visit (INDEPENDENT_AMBULATORY_CARE_PROVIDER_SITE_OTHER): Admitting: Family Medicine

## 2023-12-17 ENCOUNTER — Encounter: Payer: Self-pay | Admitting: Family Medicine

## 2023-12-17 DIAGNOSIS — Z952 Presence of prosthetic heart valve: Secondary | ICD-10-CM | POA: Diagnosis not present

## 2023-12-17 LAB — COAGUCHEK XS/INR WAIVED
INR: 1.4 — ABNORMAL HIGH (ref 0.9–1.1)
Prothrombin Time: 17.3 s

## 2023-12-17 MED ORDER — ENOXAPARIN SODIUM 120 MG/0.8ML IJ SOSY: 120.0000 mg | PREFILLED_SYRINGE | Freq: Two times a day (BID) | INTRAMUSCULAR | 0 refills | Status: DC

## 2023-12-17 NOTE — Assessment & Plan Note (Signed)
 Subtherapeutic with INR of 1.4- will give him another 3 days on his 4.5mg  and recheck. Continue lovenox . Recheck 3 days.

## 2023-12-17 NOTE — Progress Notes (Signed)
 There were no vitals taken for this visit.   Subjective:    Patient ID: Roberto [name], male    DOB: Apr 13, 1956, 68 y.o.   MRN: [id number]  HPI: Roberto [name] is a 68 y.o. male  No chief complaint on file.  Coumadin  Management.  The expected duration of coumadin  treatment is lifelong The reason for anticoagulation is  mechanical heart valve.  Present Coumadin  dose: 4.5mg  daily (since Friday- still on lovenox ) Goal:  2.5-3.5 Excessive bruising: yes Nose bleeding: no Rectal bleeding: no Prolonged menstrual cycles: N/A Eating diet with consistent amounts of foods containing Vitamin K:yes Any recent antibiotic use? no  LAST INR: 1.4 LAST PT: 17.3   Relevant past medical, surgical, family and social history reviewed and updated as indicated. Interim medical history since our last visit reviewed. Allergies and medications reviewed and updated.  Review of Systems  Constitutional: Negative.   Respiratory: Negative.    Cardiovascular: Negative.   Musculoskeletal: Negative.   Skin:  Positive for color change. Negative for pallor, rash and wound.  Psychiatric/Behavioral: Negative.      Per HPI unless specifically indicated above     Objective:    There were no vitals taken for this visit.  Wt Readings from Last 3 Encounters:  12/13/23 255 lb 15.3 oz (116.1 kg)  12/04/23 256 lb (116.1 kg)  11/27/23 263 lb (119.3 kg)    Physical Exam Vitals and nursing note reviewed.  Constitutional:      General: He is not in acute distress.    Appearance: Normal appearance. He is well-developed.  HENT:     Head: Normocephalic and atraumatic.     Right Ear: Hearing and external ear normal.     Left Ear: Hearing and external ear normal.     Nose: Nose normal.     Mouth/Throat:     Mouth: Mucous membranes are moist.     Pharynx: Oropharynx is clear.   Eyes:     General: Lids are normal. No scleral icterus.       Right eye: No discharge.        Left eye: No discharge.      Conjunctiva/sclera: Conjunctivae normal.   Pulmonary:     Effort: Pulmonary effort is normal. No respiratory distress.   Musculoskeletal:        General: Normal range of motion.   Skin:    Coloration: Skin is not jaundiced or pale.     Findings: Bruising (on his abdomen) present. No erythema, lesion or rash.   Neurological:     General: No focal deficit present.     Mental Status: He is alert and oriented to person, place, and time. Mental status is at baseline.   Psychiatric:        Mood and Affect: Mood normal.        Speech: Speech normal.        Behavior: Behavior normal.        Thought Content: Thought content normal.        Judgment: Judgment normal.     Results for orders placed or performed during the hospital encounter of 12/13/23  Glucose, capillary   Collection Time: 12/13/23  9:08 AM  Result Value Ref Range   Glucose-Capillary 100 (H) 70 - 99 mg/dL  I-STAT, chem 8   Collection Time: 12/13/23  9:15 AM  Result Value Ref Range   Sodium 133 (L) 135 - 145 mmol/L   Potassium 4.6 3.5 - 5.1 mmol/L   Chloride 106  98 - 111 mmol/L   BUN 18 8 - 23 mg/dL   Creatinine, Ser 4.09 (H) 0.61 - 1.24 mg/dL   Glucose, Bld 93 70 - 99 mg/dL   Calcium , Ion 0.92 (L) 1.15 - 1.40 mmol/L   TCO2 21 (L) 22 - 32 mmol/L   Hemoglobin 13.6 13.0 - 17.0 g/dL   HCT 81.1 91.4 - 78.2 %  Surgical pathology   Collection Time: 12/13/23 10:57 AM  Result Value Ref Range   SURGICAL PATHOLOGY      SURGICAL PATHOLOGY CASE: [id number] PATIENT: Roberto [name] Surgical Pathology Report     Clinical History: Positive Cologuard R19.5 (las)     FINAL MICROSCOPIC DIAGNOSIS:  A. COLON, TRANSVERSE, POLYPECTOMY:      Benign colonic mucosa with lymphoid aggregates.      Negative for dysplasia or malignancy.  B. RECTAL, POLYPECTOMY:      Hyperplastic polyp with prolapse changes.      Negative for dysplasia or malignancy.   GROSS DESCRIPTION:  Specimen A: Received in formalin are tan,  soft tissue fragments that are submitted in toto. Number: 2.  Size: Each 0.3 cm.  Blocks: 1  Specimen B: Received in formalin is a 0.35 cm pink-red polypoid tissue, in toto, one block.  SW 12/13/2023   Final Diagnosis performed by Dillard Frame, MD.   Electronically signed 12/14/2023 Technical component performed at [facility], [address], [place], [place] [address].  Professional component performed at [employer]. [facility], [address] [place], [place], [place] [address].  Immunohistochemistry Technical component (if applicable) was performed at [facility]. [address], [place], [place] [address].   IMMUNOHISTOCHEMISTRY DISCLAIMER (if applicable): Some of these immunohistochemical stains may have been developed and the performance characteristics determine by [facility]. Some may not have been cleared or approved by the U.S. Food and Drug Administration. The FDA has determined that such clearance or approval is not necessary. This test is used for clinical purposes. It should not be regarded as investigational or for research. This laboratory is certified under the Clinical Laboratory Improvement Amendments of 1988 (CLIA-88) as qualified to perform high complexity clinical laboratory testing.  The controls stained appropriately.   IHC stains are performed on formalin fixed, paraffin embedded tissue using a 3,3diaminobenzidine (DAB) chromogen and Leica Bond Autostain er System. The staining intensity of the nucleus is score manually and is reported as the percentage of tumor cell nuclei demonstrating specific nuclear staining. The specimens are fixed in 10% Neutral Formalin for at least 6 hours and up to 72hrs. These tests are validated on decalcified tissue. Results should be interpreted with caution given the possibility of false negative results on decalcified specimens. Antibody Clones are as follows  ER-clone 30F, PR-clone 16, Ki67- clone MM1. Some of these immunohistochemical stains may have been developed and the performance characteristics determined by [facility].    *Note: Due to a large number of results and/or encounters for the requested time period, some results have not been displayed. A complete set of results can be found in Results Review.      Assessment & Plan:   Problem List Items Addressed This Visit       Other   H/O mechanical aortic valve replacement - Primary   Subtherapeutic with INR of 1.4- will give him another 3 days on his 4.5mg  and recheck. Continue lovenox . Recheck 3 days.      Relevant Orders   CoaguChek XS/INR Waived  Follow up plan: Return in about 3 days (around 12/20/2023) for ok to double book.

## 2023-12-18 ENCOUNTER — Other Ambulatory Visit: Payer: Self-pay | Admitting: Family Medicine

## 2023-12-20 ENCOUNTER — Ambulatory Visit (INDEPENDENT_AMBULATORY_CARE_PROVIDER_SITE_OTHER): Admitting: Family Medicine

## 2023-12-20 ENCOUNTER — Other Ambulatory Visit: Payer: Self-pay

## 2023-12-20 ENCOUNTER — Encounter: Payer: Self-pay | Admitting: Family Medicine

## 2023-12-20 VITALS — BP 107/73 | HR 71 | Ht 66.0 in | Wt 256.0 lb

## 2023-12-20 DIAGNOSIS — Z952 Presence of prosthetic heart valve: Secondary | ICD-10-CM

## 2023-12-20 LAB — COAGUCHEK XS/INR WAIVED
INR: 3.2 — ABNORMAL HIGH (ref 0.9–1.1)
Prothrombin Time: 38.9 s

## 2023-12-20 NOTE — Progress Notes (Signed)
 BP 107/73 (BP Location: Left Arm, Patient Position: Sitting, Cuff Size: Large)   Pulse 71   Ht 5' 6 (1.676 m)   Wt 256 lb (116.1 kg)   SpO2 96%   BMI 41.32 kg/m    Subjective:    Patient ID: Roberto [name], male    DOB: 01-Nov-1955, 68 y.o.   MRN: [id number]  HPI: Roberto [name] is a 68 y.o. male  Chief Complaint  Patient presents with   INR CHECK   Coumadin  Management.  The expected duration of coumadin  treatment is lifelong The reason for anticoagulation is  mechanical heart valve.  Present Coumadin  dose: 4.5mg  Goal:  2.5-3.5 Excessive bruising: no Nose bleeding: no Rectal bleeding: no Prolonged menstrual cycles: N/A Eating diet with consistent amounts of foods containing Vitamin K:yes Any recent antibiotic use? no  LAST PT: 38.9 LAST INR: 3.2  Relevant past medical, surgical, family and social history reviewed and updated as indicated. Interim medical history since our last visit reviewed. Allergies and medications reviewed and updated.  Review of Systems  Constitutional: Negative.   Respiratory: Negative.    Cardiovascular: Negative.   Musculoskeletal: Negative.   Neurological: Negative.   Psychiatric/Behavioral: Negative.      Per HPI unless specifically indicated above     Objective:    BP 107/73 (BP Location: Left Arm, Patient Position: Sitting, Cuff Size: Large)   Pulse 71   Ht 5' 6 (1.676 m)   Wt 256 lb (116.1 kg)   SpO2 96%   BMI 41.32 kg/m   Wt Readings from Last 3 Encounters:  12/20/23 256 lb (116.1 kg)  12/13/23 255 lb 15.3 oz (116.1 kg)  12/04/23 256 lb (116.1 kg)    Physical Exam Vitals and nursing note reviewed.  Constitutional:      General: He is not in acute distress.    Appearance: Normal appearance. He is obese. He is not ill-appearing, toxic-appearing or diaphoretic.  HENT:     Head: Normocephalic and atraumatic.     Right Ear: External ear normal.     Left Ear: External ear normal.     Nose: Nose normal.      Mouth/Throat:     Mouth: Mucous membranes are moist.     Pharynx: Oropharynx is clear.   Eyes:     General: No scleral icterus.       Right eye: No discharge.        Left eye: No discharge.     Extraocular Movements: Extraocular movements intact.     Conjunctiva/sclera: Conjunctivae normal.     Pupils: Pupils are equal, round, and reactive to light.    Cardiovascular:     Rate and Rhythm: Normal rate and regular rhythm.     Pulses: Normal pulses.     Heart sounds: Normal heart sounds. No murmur heard.    No friction rub. No gallop.  Pulmonary:     Effort: Pulmonary effort is normal. No respiratory distress.     Breath sounds: Normal breath sounds. No stridor. No wheezing, rhonchi or rales.  Chest:     Chest wall: No tenderness.   Musculoskeletal:        General: Normal range of motion.     Cervical back: Normal range of motion and neck supple.   Skin:    General: Skin is warm and dry.     Capillary Refill: Capillary refill takes less than 2 seconds.     Coloration: Skin is not jaundiced or pale.  Findings: No bruising, erythema, lesion or rash.   Neurological:     General: No focal deficit present.     Mental Status: He is alert and oriented to person, place, and time. Mental status is at baseline.   Psychiatric:        Mood and Affect: Mood normal.        Behavior: Behavior normal.        Thought Content: Thought content normal.        Judgment: Judgment normal.     Results for orders placed or performed in visit on 12/17/23  CoaguChek XS/INR Waived   Collection Time: 12/17/23 10:40 AM  Result Value Ref Range   INR 1.4 (H) 0.9 - 1.1   Prothrombin Time 17.3 sec   *Note: Due to a large number of results and/or encounters for the requested time period, some results have not been displayed. A complete set of results can be found in Results Review.      Assessment & Plan:   Problem List Items Addressed This Visit       Other   H/O mechanical aortic valve  replacement - Primary   Therapeutic with INR for 3.2- will stop lovenox  and come back in 1 week for follow up. Call with any concerns.       Relevant Orders   CoaguChek XS/INR Waived     Follow up plan: Return in about 1 week (around 12/27/2023).

## 2023-12-20 NOTE — Patient Instructions (Signed)

## 2023-12-20 NOTE — Assessment & Plan Note (Signed)
 Therapeutic with INR for 3.2- will stop lovenox  and come back in 1 week for follow up. Call with any concerns.

## 2023-12-20 NOTE — Progress Notes (Signed)
 Electrophysiology Clinic Note    Date:  12/21/2023  Patient ID:  Roberto [name], DOB 1955/07/14, MRN [id number] PCP:  [name], DO  Cardiologist:  Sammy Crisp, MD Electrophysiologist: Manya Sells, MD   Discussed the use of AI scribe software for clinical note transcription with the patient, who gave verbal consent to proceed.   Patient Profile    Chief Complaint: VT, AFib, ICD follow-up  History of Present Illness: Roberto [name] is a 68 y.o. male with PMH notable for VT, persis afib, HFrEF s/p ICD, VHD s/p mechanical AVR (2016) on warfarin, non-obs CAD, ascending aortic aneurysm, TIA/CVA, renal infarct; seen today for Manya Sells, MD for routine electrophysiology followup.   He presented to [facility] ER 06/2023 in MM VT. Amiodarone  was loaded with conversion to AFib. Echo at that time with reduced LVEF and he is s/p ICD implant at that time.  He was re hospitalized 08/2023 for slow VT in 130s, zones adjusted with ATP > afib. He was then hospitalized again later 08/2023 for inappropriate therapies, thought dual tachycardia. He was cardioverted during that hospitalization to SR/accelerated junctional.   He last saw PA Caralyn Chandler 09/2023 at which time he was doing well. No further shocks. INRs managed by PCP office.  He recently had colonoscopy and held warfarin with bridging with Lovenox .   On follow-up today, he believes he is overall doing very well from a cardiac standpoint.  He has not had any chest pain, chest pressure, palpitations, no Hypotears therapies from his ICD.  He checks his blood pressure sporadically systolics are always in the 90s to low 100s and he is asymptomatic of this.  He has noticed that his weight has increased somewhat to about 269 pounds, previously 256 or so.  He does not endorse increased lower extremity swelling, shortness of breath, abdominal distention, he notes his abd remains very soft except at the Lovenox  shot injection sites He continues to take  amiodarone  daily, no missed doses.       Arrhythmia/Device History Abbott single chamber ICD implanted 06/25/23; dx VT   Arrhythmia/AAD hx VT and Afib Dec 2024 Amiodarone  started Dec 2024    ROS:  Please see the history of present illness. All other systems are reviewed and otherwise negative.    Physical Exam    VS:  BP (!) 90/58 (BP Location: Left Arm, Patient Position: Sitting, Cuff Size: Normal)   Pulse 88   Ht 5' 9 (1.753 m)   Wt 268 lb 12.8 oz (121.9 kg)   SpO2 98%   BMI 39.69 kg/m  BMI: Body mass index is 39.69 kg/m.  Wt Readings from Last 3 Encounters:  12/21/23 268 lb 12.8 oz (121.9 kg)  12/20/23 256 lb (116.1 kg)  12/13/23 255 lb 15.3 oz (116.1 kg)     GEN- The patient is well appearing, alert and oriented x 3 today.   Lungs- Clear to ausculation bilaterally, normal work of breathing.  Heart- Irregularly irregular rate and rhythm, no murmurs, rubs or gallops Extremities- Trace peripheral edema, warm, dry Skin-   device pocket well-healed, no tethering   Device interrogation done today and reviewed by myself:  Battery 9.4 years Lead thresholds, impedence, sensing stable  3 HVR episodes, 2 with EGMS that both appear atrially-driven Low VP Histograms appropriate No changes made today   Studies Reviewed   Previous EP, cardiology notes.    EKG is ordered. Personal review of EKG from today shows:    EKG Interpretation Date/Time:  Friday  December 21 2023 08:59:52 EDT Ventricular Rate:  83 PR Interval:    QRS Duration:  156 QT Interval:  476 QTC Calculation: 559 R Axis:   -3  Text Interpretation: Atrial flutter with variable A-V block Left bundle branch block Confirmed by Dishon Kehoe [phone]) on 12/21/2023 9:07:39 AM    TTE, 09/27/2023  1. Left ventricular ejection fraction, by estimation, is 25 to 30%. Left ventricular ejection fraction by 3D volume is 28 %. The left ventricle has severely decreased function. The left ventricle demonstrates global  hypokinesis. The left ventricular internal cavity size was severely dilated. Left ventricular diastolic parameters are consistent with Grade II diastolic dysfunction (pseudonormalization). The average left ventricular global longitudinal strain is -10.5 %. The global longitudinal strain is abnormal.   2. Right ventricular systolic function is mildly reduced. The right ventricular size is normal.   3. Left atrial size was mild to moderately dilated.   4. Right atrial size was moderately dilated.   5. The mitral valve is degenerative. Mild to moderate mitral valve regurgitation. No evidence of mitral stenosis.   6. Tricuspid valve regurgitation is mild to moderate.   7. The aortic valve has been repaired/replaced. Aortic valve regurgitation versus perivalvular leak is trivial. Mild to moderate aortic  valve stenosis with at least some component of patient-prosthesis mismatch. Aortic valve mean gradient measures 12.6 mmHg.   8. There is mild dilatation of the aortic root, measuring 43 mm. Known severe dilation of the ascending aorta is not well-visualized on this examination.   Cardiac MRI, 06/25/2023 1. Severe LVE with global hypokinesis LVEF 20%  2. Delayed gadolinium images with mid myocardial basal septal uptake not consistent with CAD  3. Mild RVE with hypokinesis RVEF 41%  4. Severe RAE and Moderate LAE  5. Severe ascending thoracic aneurysm measuring 5.5 cm Patient does not appear to have had Bentall procedure during his AVR Note non contrast chest CT done 03/17/23 measured this at 5.9 cm He has been seen by Dr Sherene Dilling CVTS on 02/14/23 and not thought to be a redo candidate due to co morbidities  6.  Normal parametric measures see values above  7. Bileaflet 27 mm AVR with carbomedics valve mild closing volume AR and normal leaflet motion  8.  Mild MV leaflet thickening with mild appearing MR  9.  Moderate appearing TR  RHC, coronary angiography, 06/20/2023 Mild to moderate, non-obstructive  coronary artery disease with 60% D1 stenosis and 20% mid LAD and proximal RCA lesions. No significant change noted from prior angiogram in 2020. Moderately elevated left heart filling pressure (PCWP 30 mmHg with prominent v-waves). Severely elevated right heart filling pressure (RA 25, RVEDP 18 mmHg). Moderate pulmonary hypertension (PA 50/28, mean 35 mmHg). Moderately reduced Fick cardiac output/index (CO 4.4 L/min, CI 1.9 L/min/m^2).   Assessment and Plan     #) Persis AFib #) amiodarone  monitoring #) atrial flutter Presents today in rate-controlled atrial flutter, asymptomatic With his low LVEF, rhythm mgmt is indicated He recently paused his warfarin for colonoscopy We discussed TEE/DCCV at next opportunity and he is agreeable to this Conintue 200mg  amiodarone  daily Update CMP, thyroid  labs today    #) Hypercoag d/t persis afib #) h/o mechanical aortic valve CHA2DS2-VASc Score = at least 5 [CHF History: 1, HTN History: 0, Diabetes History: 0, Stroke History: 2, Vascular Disease History: 1, Age Score: 1, Gender Score: 0].  Therefore, the patient's annual risk of stroke is 7.2 %.    Stroke ppx - warfarin, managed at  PCP office No bleeding concerns  #) VT #) ICM s/p ICD ICD functioning well No ventricular arrhythmias noted Low VP  #) HFrEF Patient has noticed his weight increased, though do not appreciate hypervolemia on exam or by symptoms DCCV as above Follows regularly with Dr. Nolan Battle and HF NP   Informed Consent   Shared Decision Making/Informed Consent   The risks [stroke, cardiac arrhythmias rarely resulting in the need for a temporary or permanent pacemaker, skin irritation or burns, esophageal damage, perforation (1:10,000 risk), bleeding, pharyngeal hematoma as well as other potential complications associated with conscious sedation including aspiration, arrhythmia, respiratory failure and death], benefits (treatment guidance, restoration of normal sinus rhythm,  diagnostic support) and alternatives of a transesophageal echocardiogram guided cardioversion were discussed in detail with Roberto [name] and he is willing to proceed.      Current medicines are reviewed at length with the patient today.   The patient does not have concerns regarding his medicines.  The following changes were made today:  none  Labs/ tests ordered today include:  Orders Placed This Encounter  Procedures   Comprehensive metabolic panel with GFR   CBC   TSH   T4, free   EKG 12-Lead   EKG 12-Lead     Disposition: Follow up with EP Team or EP APP as usual post procedure   Signed, Adaline Holly, NP  12/21/23  12:48 PM  Electrophysiology [facility]

## 2023-12-20 NOTE — Telephone Encounter (Signed)
 LOV 12/17/2023  Requested Prescriptions  Pending Prescriptions Disp Refills   metFORMIN  (GLUCOPHAGE -XR) 500 MG 24 hr tablet [Pharmacy Med Name: metFORMIN  HCl ER 500 MG Oral Tablet Extended Release 24 Hour] 90 tablet 0    Sig: Take 1 tablet by mouth once daily with breakfast     Endocrinology:  Diabetes - Biguanides Failed - 12/20/2023 11:25 AM      Failed - Cr in normal range and within 360 days    Creatinine  Date Value Ref Range Status  05/25/2014 0.85 0.60 - 1.30 mg/dL Final   Creatinine, Ser  Date Value Ref Range Status  12/13/2023 1.40 (H) 0.61 - 1.24 mg/dL Final         Failed - eGFR in normal range and within 360 days    EGFR (African American)  Date Value Ref Range Status  05/25/2014 >60 >18mL/min Final   GFR calc Af Amer  Date Value Ref Range Status  05/12/2020 64 >59 mL/min/1.73 Final    Comment:    **In accordance with recommendations from the NKF-ASN Task force,**   [facility] is in the process of updating its eGFR calculation to the   2021 CKD-EPI creatinine equation that estimates kidney function   without a race variable.    EGFR (Non-African Amer.)  Date Value Ref Range Status  05/25/2014 >60 >48mL/min Final    Comment:    eGFR values <74mL/min/1.73 m2 may be an indication of chronic kidney disease (CKD). Calculated eGFR, using the MRDR Study equation, is useful in  patients with stable renal function. The eGFR calculation will not be reliable in acutely ill patients when serum creatinine is changing rapidly. It is not useful in patients on dialysis. The eGFR calculation may not be applicable to patients at the low and high extremes of body sizes, pregnant women, and vegetarians.    GFR, Estimated  Date Value Ref Range Status  09/10/2023 53 (L) >60 mL/min Final    Comment:    (NOTE) Calculated using the CKD-EPI Creatinine Equation (2021)    eGFR  Date Value Ref Range Status  11/27/2023 42 (L) >59 mL/min/1.73 Final         Failed - B12 Level in  normal range and within 720 days    No results found for: VITAMINB12       Passed - HBA1C is between 0 and 7.9 and within 180 days    Hemoglobin A1C  Date Value Ref Range Status  04/19/2016 6.6  Final   HB A1C (BAYER DCA - WAIVED)  Date Value Ref Range Status  09/11/2023 6.1 (H) 4.8 - 5.6 % Final    Comment:             Prediabetes: 5.7 - 6.4          Diabetes: >6.4          Glycemic control for adults with diabetes: <7.0          Passed - Valid encounter within last 6 months    Recent Outpatient Visits           Today H/O mechanical aortic valve replacement   [place] [facility] [place], [name], DO   3 days ago H/O mechanical aortic valve replacement   [place] [facility] [place], [name], DO   1 week ago H/O mechanical aortic valve replacement   [place] [facility] [place], [place] P, DO   2 weeks ago H/O mechanical aortic valve replacement  [place] [facility] [place], [place] P, DO   2 weeks ago H/O mechanical aortic valve replacement   [place] [facility] [place], Jerilee Montane, DO       Future Appointments             In 3 months End, Veryl Gottron, MD [facility] - CBC within normal limits and completed in the last 12 months    WBC  Date Value Ref Range Status  11/15/2023 7.8 3.4 - 10.8 x10E3/uL Final  08/30/2023 8.9 4.0 - 10.5 K/uL Final   RBC  Date Value Ref Range Status  11/15/2023 5.13 4.14 - 5.80 x10E6/uL Final  08/30/2023 4.66 4.22 - 5.81 MIL/uL Final   Hemoglobin  Date Value Ref Range Status  12/13/2023 13.6 13.0 - 17.0 g/dL Final  16/04/9603 54.0 13.0 - 17.7 g/dL Final   HCT  Date Value Ref Range Status  12/13/2023 40.0 39.0 - 52.0 % Final   Hematocrit  Date Value Ref Range Status  11/15/2023 46.8 37.5 - 51.0 % Final   MCHC  Date Value Ref Range Status  11/15/2023 31.8 31.5 - 35.7 g/dL Final  98/05/9146 82.9 30.0 -  36.0 g/dL Final   [facility]  Date Value Ref Range Status  11/15/2023 29.0 26.6 - 33.0 pg Final  08/30/2023 27.9 26.0 - 34.0 pg Final   MCV  Date Value Ref Range Status  11/15/2023 91 79 - 97 fL Final  05/25/2014 88 80 - 100 fL Final   No results found for: PLTCOUNTKUC, LABPLAT, POCPLA RDW  Date Value Ref Range Status  11/15/2023 16.0 (H) 11.6 - 15.4 % Final  05/25/2014 13.9 11.5 - 14.5 % Final

## 2023-12-20 NOTE — Patient Outreach (Signed)
 Complex Care Management   Visit Note  12/20/2023  Name:  Roberto [name] MRN: [id number] DOB: 11-Apr-1956  Situation: Referral received for Complex Care Management related to SDOH Barriers:  Lack of essential utilities [employer] I obtained verbal consent from Caregiver.  Visit completed with Caregiver   on the phone  Background:   Past Medical History:  Diagnosis Date   Aneurysm (HCC)    Bicuspid aortic valve    a. s/p #27 Carbomedics mechanical valve on 03/25/2010; b. on Coumadin ; c. TTE 12/17: EF 40-45%, moderately dilated LV with moderate LVH, AVR well-seated with 14 mmHg gradient, peak AV velocity 2.5 m/s, mild mitral valve thickening with mild MR, mildly dilated RV with mildly reduced contraction   Cellulitis    CHF (congestive heart failure) (HCC)    Chronic kidney disease    Chronic systolic CHF (congestive heart failure) (HCC)    a. R/LHC 03/2010 showed no significant CAD, LVEDP 31 mmHg, mean AoV gradient 34 mmHg at rest and 47 mmHg with dobutamine 20 mcg/kg/min, AVA 1.0 cm^2, RA 31, RV 68/25, PA 68/47, PCWP 38. PA sat 65%. CO 6.2 L/min (Fick) and 5.3 L/min (thermodilution)   Clotting disorder (HCC)    COPD (chronic obstructive pulmonary disease) (HCC)    H/O mechanical aortic valve replacement 03/25/2010   a. #27 Carbomedics mechanical valve   Hearing loss    High cholesterol    HTN (hypertension)    Hypercholesterolemia    Renal infarct [facility]) 2017   Multiple right renal infarcts, likely embolic.   Stage 3 chronic kidney disease (HCC)    Stroke [facility])    TIA (transient ischemic attack) 05/2014    Assessment: SW completed a telephone outreach with caregiver, she states the resources SW provided were unable to assist. She states she did contact the utility commission and received some assistance from them. No other resources are needed at this time.  SDOH Interventions    Flowsheet Row Office Visit from 11/15/2023 in [facility] Office Visit from  11/12/2023 in [facility] Care Coordination from 10/24/2023 in [place] [facility] DEPARTMENT Patient Outreach from 10/19/2023 in [place] POPULATION HEALTH DEPARTMENT Telephone from 10/05/2023 in [facility] Care Coordination Office Visit from 09/26/2023 in [facility]  SDOH Interventions        Food Insecurity Interventions -- -- Intervention Not Indicated Intervention Not Indicated Intervention Not Indicated --  Housing Interventions -- -- Intervention Not Indicated Intervention Not Indicated Intervention Not Indicated --  Transportation Interventions -- -- Intervention Not Indicated Intervention Not Indicated -- --  Utilities Interventions -- -- Other (Comment)  [referral through churches discussed] Other (Comment)  [Referral placed for outreach with [employer] Care Guide] [employer] Provided  [encouraged follow up with [employer] and DSS-however reports no funding] --  Depression Interventions/Treatment  Medication, Currently on Treatment Currently on Treatment, Medication -- -- -- PHQ2-9 Score <4 Follow-up Not Indicated  Social Connections Interventions -- -- -- Intervention Not Indicated  [Declines current need for resources. Agreed to update care team if her requires assistance.] -- --     Recommendation:   No recommendations at this time.  Follow Up Plan:   Patient has met all care management goals. Care Management case will be closed. Patient has been provided contact information should new needs arise.   Valora Gear, Florestine Hurl, MHA [place]  [facility] Social Worker, Population Health [phone]

## 2023-12-21 ENCOUNTER — Ambulatory Visit: Attending: Cardiology | Admitting: Cardiology

## 2023-12-21 ENCOUNTER — Telehealth: Payer: Self-pay | Admitting: Internal Medicine

## 2023-12-21 VITALS — BP 90/58 | HR 88 | Ht 69.0 in | Wt 268.8 lb

## 2023-12-21 DIAGNOSIS — Z79899 Other long term (current) drug therapy: Secondary | ICD-10-CM

## 2023-12-21 DIAGNOSIS — Z95810 Presence of automatic (implantable) cardiac defibrillator: Secondary | ICD-10-CM | POA: Diagnosis not present

## 2023-12-21 DIAGNOSIS — I472 Ventricular tachycardia, unspecified: Secondary | ICD-10-CM

## 2023-12-21 DIAGNOSIS — I4891 Unspecified atrial fibrillation: Secondary | ICD-10-CM | POA: Diagnosis not present

## 2023-12-21 DIAGNOSIS — Z5181 Encounter for therapeutic drug level monitoring: Secondary | ICD-10-CM | POA: Diagnosis not present

## 2023-12-21 DIAGNOSIS — Z952 Presence of prosthetic heart valve: Secondary | ICD-10-CM

## 2023-12-21 DIAGNOSIS — I5022 Chronic systolic (congestive) heart failure: Secondary | ICD-10-CM

## 2023-12-21 DIAGNOSIS — I4892 Unspecified atrial flutter: Secondary | ICD-10-CM

## 2023-12-21 LAB — CUP PACEART INCLINIC DEVICE CHECK
Battery Remaining Longevity: 117 mo
Brady Statistic RV Percent Paced: 0.08 %
Date Time Interrogation Session: 20250620125442
HighPow Impedance: 76.5 Ohm
Implantable Lead Connection Status: 753985
Implantable Lead Implant Date: 20241223
Implantable Lead Location: 753860
Implantable Lead Model: 7122
Implantable Pulse Generator Implant Date: 20241223
Lead Channel Impedance Value: 537.5 Ohm
Lead Channel Pacing Threshold Amplitude: 1.25 V
Lead Channel Pacing Threshold Amplitude: 1.25 V
Lead Channel Pacing Threshold Pulse Width: 0.5 ms
Lead Channel Pacing Threshold Pulse Width: 0.5 ms
Lead Channel Sensing Intrinsic Amplitude: 12 mV
Lead Channel Setting Pacing Amplitude: 2.5 V
Lead Channel Setting Pacing Pulse Width: 0.5 ms
Lead Channel Setting Sensing Sensitivity: 0.5 mV
Pulse Gen Serial Number: 111074223

## 2023-12-21 NOTE — Telephone Encounter (Signed)
 Called and spoke with the patient's [employer] and discussed the patient's visit with the provider earlier today.  Informed the caregiver that the patient is scheduled for a cardioversion on Thursday, 12/27/23 at 0630. [name] asked why the patient needed this procedure.  Explained to [name] the indication and rationale for the patient to have the procedure due to his persistent A-Fib. Patient's caregiver verbalized understanding with all questions and concerns addressed.

## 2023-12-21 NOTE — Patient Instructions (Signed)
 Medication Instructions:  The current medical regimen is effective;  continue present plan and medications as directed. Please refer to the Current Medication list given to you today.   *If you need a refill on your cardiac medications before your next appointment, please call your pharmacy*  Lab Work: Your provider would like for you to have following labs drawn today CMET, CBC, TSH, T4.   If you have labs (blood work) drawn today and your tests are completely normal, you will receive your results only by: MyChart Message (if you have MyChart) OR A paper copy in the mail If you have any lab test that is abnormal or we need to change your treatment, we will call you to review the results.  Testing/Procedures:  Your physician has requested that you have a TEE/Cardioversion. During a TEE, sound waves are used to create images of your heart. It provides your doctor with information about the size and shape of your heart and how well your heart's chambers and valves are working. In this test, a transducer is attached to the end of a flexible tube that is guided down you throat and into your esophagus (the tube leading from your mouth to your stomach) to get a more detailed image of your heart. Once the TEE has determined that a blood clot is not present, the cardioversion begins. Electrical Cardioversion uses a jolt of electricity to your heart either through paddles or wired patches attached to your chest. This is a controlled, usually prescheduled, procedure. This procedure is done at the hospital and you are not awake during the procedure. You usually go home the day of the procedure. Please see the instruction sheet given to you today for more information.   Follow-Up: At [facility], you and your health needs are our priority.  As part of our continuing mission to provide you with exceptional heart care, our providers are all part of one team.  This team includes your primary  Cardiologist (physician) and Advanced Practice Providers or APPs (Physician Assistants and Nurse Practitioners) who all work together to provide you with the care you need, when you need it.  Your next appointment:   2 week(s) post Cardioversion   Provider:   Suzann Riddle, NP   We recommend signing up for the patient portal called MyChart.  Sign up information is provided on this After Visit Summary.  MyChart is used to connect with patients for Virtual Visits (Telemedicine).  Patients are able to view lab/test results, encounter notes, upcoming appointments, etc.  Non-urgent messages can be sent to your provider as well.   To learn more about what you can do with MyChart, go to [email or web address].   Other Instructions     Dear Roberto [name]  You are scheduled for a TEE (Transesophageal Echocardiogram) Guided Cardioversion on Thursday, June 26 with Dr. Gollan.  Please arrive at the [facility]ascular Center Entrance of [facility], [address], [place] [address] at 6:30 AM (This is 1 hour(s) prior to your procedure time).  Proceed to the Check-In Desk directly inside the entrance.  Procedure Parking: Use the entrance off of the [address] side of the hospital. Turn right upon entering and follow the driveway to parking that is directly in front of the Heart & Vascular Center. There is no valet parking available at this entrance, however there is an awning directly in front of the Heart & Vascular Center for drop off/ pick up for patients.    DIET:  Nothing to eat or drink after midnight except a sip of water with medications (see medication instructions below)  MEDICATION INSTRUCTIONS: !!IF ANY NEW MEDICATIONS ARE STARTED AFTER TODAY, PLEASE NOTIFY YOUR PROVIDER AS SOON AS POSSIBLE!!  FYI: Medications such as Semaglutide  (Ozempic , Wegovy ), Tirzepatide (Mounjaro, Zepbound), Dulaglutide (Trulicity), etc (GLP1 agonists) AND Canagliflozin (Invokana), Dapagliflozin  (Farxiga ),  Empagliflozin (Jardiance), Ertugliflozin (Steglatro), Bexagliflozin [employer]) or any combination with one of these drugs such as Invokamet (Canagliflozin/Metformin ), Synjardy (Empagliflozin/Metformin ), etc (SGLT2 inhibitors) must be held around the time of a procedure. This is not a comprehensive list of all of these drugs. Please review all of your medications and talk to your provider if you take any one of these. If you are not sure, ask your provider.  HOLD: Semaglutide  (Ozempic , Rybelsus , Wegovy ) for 1 week prior to the procedure. Patient has held this for the last two weeks.  HOLD: Dapagliflozin  (Farxiga ) for 3 days prior to the procedure. Last dose on Monday, June 03.  Continue taking your anticoagulant (blood thinner): Warfarin (Coumadin ).  You will need to continue this after your procedure until you are told by your provider that it is safe to stop.    LABS: CBC, CMET, TSH, T4 today (12/21/2023)   FYI:  For your safety, and to allow us  to monitor your vital signs accurately during the surgery/procedure we request: If you have artificial nails, gel coating, SNS etc, please have those removed prior to your surgery/procedure. Not having the nail coverings /polish removed may result in cancellation or delay of your surgery/procedure.  Your support person will be asked to wait in the waiting room during your procedure.  It is OK to have someone drop you off and come back when you are ready to be discharged.  You cannot drive after the procedure and will need someone to drive you home.  Bring your insurance cards.  *Special Note: Every effort is made to have your procedure done on time. Occasionally there are emergencies that occur at the hospital that may cause delays. Please be patient if a delay does occur.

## 2023-12-21 NOTE — Telephone Encounter (Signed)
 Pts caregiver would like a c/b in regards to what was discussed at his appt today 12/21/23 due to the pt not understanding. Please advise

## 2023-12-22 ENCOUNTER — Other Ambulatory Visit: Payer: Self-pay | Admitting: Internal Medicine

## 2023-12-22 ENCOUNTER — Other Ambulatory Visit: Payer: Self-pay | Admitting: Family Medicine

## 2023-12-22 LAB — COMPREHENSIVE METABOLIC PANEL WITH GFR
ALT: 27 IU/L (ref 0–44)
AST: 32 IU/L (ref 0–40)
Albumin: 4.4 g/dL (ref 3.9–4.9)
Alkaline Phosphatase: 49 IU/L (ref 44–121)
BUN/Creatinine Ratio: 13 (ref 10–24)
BUN: 24 mg/dL (ref 8–27)
Bilirubin Total: 0.4 mg/dL (ref 0.0–1.2)
CO2: 21 mmol/L (ref 20–29)
Calcium: 9.3 mg/dL (ref 8.6–10.2)
Chloride: 100 mmol/L (ref 96–106)
Creatinine, Ser: 1.82 mg/dL — ABNORMAL HIGH (ref 0.76–1.27)
Globulin, Total: 2.7 g/dL (ref 1.5–4.5)
Glucose: 93 mg/dL (ref 70–99)
Potassium: 4.8 mmol/L (ref 3.5–5.2)
Sodium: 140 mmol/L (ref 134–144)
Total Protein: 7.1 g/dL (ref 6.0–8.5)
eGFR: 40 mL/min/{1.73_m2} — ABNORMAL LOW (ref >59)

## 2023-12-22 LAB — CBC
Hematocrit: 43.4 % (ref 37.5–51.0)
Hemoglobin: 13.6 g/dL (ref 13.0–17.7)
MCH: 29 pg (ref 26.6–33.0)
MCHC: 31.3 g/dL — ABNORMAL LOW (ref 31.5–35.7)
MCV: 93 fL (ref 79–97)
Platelets: 289 10*3/uL (ref 150–450)
RBC: 4.69 x10E6/uL (ref 4.14–5.80)
RDW: 15.5 % — ABNORMAL HIGH (ref 11.6–15.4)
WBC: 9 10*3/uL (ref 3.4–10.8)

## 2023-12-22 LAB — TSH: TSH: 3.57 u[IU]/mL (ref 0.450–4.500)

## 2023-12-22 LAB — T4, FREE: Free T4: 1.3 ng/dL (ref 0.82–1.77)

## 2023-12-23 ENCOUNTER — Ambulatory Visit: Payer: Self-pay | Admitting: Cardiology

## 2023-12-24 ENCOUNTER — Telehealth: Payer: Self-pay

## 2023-12-24 ENCOUNTER — Telehealth: Payer: Self-pay | Admitting: *Deleted

## 2023-12-24 ENCOUNTER — Encounter: Payer: Self-pay | Admitting: Family Medicine

## 2023-12-24 DIAGNOSIS — I4891 Unspecified atrial fibrillation: Secondary | ICD-10-CM

## 2023-12-24 NOTE — Telephone Encounter (Signed)
 Copied from CRM [id number]. Topic: General - Call Back - No Documentation >> Dec 21, 2023 12:19 PM Roberto [name] wrote: Reason for CRM: Patient calling about Urgent discussion with Dr Vicci or Izetta Dr Nancye nurse the patient needs to speak with someone in the office about urgent concern (patient had to cancel up coming appt and schedule a procedure) Pt num [phone] (M)

## 2023-12-24 NOTE — Telephone Encounter (Signed)
 Patient will need PT/INR lab done on 12/26/23 prior to his scheduled procedure on 12/27/23.  Scheduled for: TEE/DCCV Date: 12/27/23 Time: 07:30 am Arrival Time: 06:30 am Location: [facility] Instructions: Reviewed  Patients caregiver per release form verbalized understanding of all instructions and need for PT/INR check on Wednesday 12/26/23 at the [facility] entrance. She verbalized understanding of our conversation, instructions, arrival time, location, and labs needed with no further questions.

## 2023-12-24 NOTE — Telephone Encounter (Signed)
 [name] calling to check when he should have in next INR draw. He has been scheduled for a cardioversion on Thursday and had to cancel his office appt. [name] aware I will call back once I have an answer.

## 2023-12-24 NOTE — Telephone Encounter (Signed)
 I can see him Wednesday or Friday.

## 2023-12-24 NOTE — Telephone Encounter (Signed)
 Appointment has been made

## 2023-12-24 NOTE — Telephone Encounter (Addendum)
 Spoke with provider that will be doing the TEE/DCCV for this patient and reviewed patients INR levels. His recommendations were to reach out to PCP office that manages his coumadin  level to see if they can let patient run greater than 2 after his cardioversion.   Called and spoke with Dr. Duwaine Louder in regards to patients INR levels. She stated that it was no problem. She stated that patient was just on lovenox  bridge for colonoscopy and has 5 days left that he can take if provider would like that. Will route to provider for review and further recommendations.

## 2023-12-24 NOTE — Telephone Encounter (Signed)
 Verbal discussion with Dr. Gollan and reviewed information discussed with Dr. Vicci. His recommendations were to not restart the lovenox . Secure chat sent to Comer Lumpkins CMA with that information. No further needs.   Patient to continue current regimen, get PT/INR checked on Wednesday, and then have his procedure on Thursday.

## 2023-12-25 ENCOUNTER — Ambulatory Visit (INDEPENDENT_AMBULATORY_CARE_PROVIDER_SITE_OTHER): Payer: 59

## 2023-12-25 DIAGNOSIS — I472 Ventricular tachycardia, unspecified: Secondary | ICD-10-CM | POA: Diagnosis not present

## 2023-12-25 LAB — CUP PACEART REMOTE DEVICE CHECK
Battery Remaining Longevity: 115 mo
Battery Remaining Percentage: 92 %
Battery Voltage: 3.04 V
Brady Statistic RV Percent Paced: 1 %
Date Time Interrogation Session: 20250624020845
HighPow Impedance: 71 Ohm
Implantable Lead Connection Status: 753985
Implantable Lead Implant Date: 20241223
Implantable Lead Location: 753860
Implantable Lead Model: 7122
Implantable Pulse Generator Implant Date: 20241223
Lead Channel Impedance Value: 500 Ohm
Lead Channel Pacing Threshold Amplitude: 1.25 V
Lead Channel Pacing Threshold Pulse Width: 0.5 ms
Lead Channel Sensing Intrinsic Amplitude: 12 mV
Lead Channel Setting Pacing Amplitude: 2.5 V
Lead Channel Setting Pacing Pulse Width: 0.5 ms
Lead Channel Setting Sensing Sensitivity: 0.5 mV
Pulse Gen Serial Number: 111074223

## 2023-12-25 NOTE — Telephone Encounter (Signed)
 Requested Prescriptions  Refused Prescriptions Disp Refills   enoxaparin  (LOVENOX ) 120 MG/0.8ML injection [Pharmacy Med Name: ENOXAPARIN  120/0.8  INJ] 28 mL 0    Sig: INJECT CONTENTS OF 1 SYRINGE SUBCUTANEOUSLY EVERY 12 HOURS .  START  6/3  AND  CONTINUE  UNTIL  YOU  SEE  ME  ON  6/16     Not Delegated - Hematology:  Anticoagulants - enoxaparin  sodium Failed - 12/25/2023 12:23 PM      Failed - This refill cannot be delegated      Failed - Cr in normal range and within 360 days    Creatinine  Date Value Ref Range Status  05/25/2014 0.85 0.60 - 1.30 mg/dL Final   Creatinine, Ser  Date Value Ref Range Status  12/21/2023 1.82 (H) 0.76 - 1.27 mg/dL Final         Passed - HCT in normal range and within 360 days    Hematocrit  Date Value Ref Range Status  12/21/2023 43.4 37.5 - 51.0 % Final         Passed - HGB in normal range and within 360 days    Hemoglobin  Date Value Ref Range Status  12/21/2023 13.6 13.0 - 17.7 g/dL Final         Passed - PLT in normal range and within 360 days    Platelets  Date Value Ref Range Status  12/21/2023 289 150 - 450 x10E3/uL Final         Passed - Valid encounter within last 12 months    Recent Outpatient Visits           5 days ago H/O mechanical aortic valve replacement   [place] [facility] [place], [name], DO   1 week ago H/O mechanical aortic valve replacement   [place] [facility] [place], [name], DO   2 weeks ago H/O mechanical aortic valve replacement   [place] [facility] [place], [name], DO   3 weeks ago H/O mechanical aortic valve replacement   [place] [facility] [place], [name], DO   3 weeks ago H/O mechanical aortic valve replacement   [place] [facility] [place], Duwaine SQUIBB, DO       Future Appointments             In 2 months End, [name], MD  HeartCare at [facility]             enoxaparin  (LOVENOX ) 120 MG/0.8ML  injection [Pharmacy Med Name: ENOXAPARIN  120/0.8  INJ] 14 mL 0    Sig: INJECT 1 SYRINGE (120MG  TOTAL) SUBCUTANEOUSLY EVERY 12 HOURS FOR 7 DAYS     Not Delegated - Hematology:  Anticoagulants - enoxaparin  sodium Failed - 12/25/2023 12:23 PM      Failed - This refill cannot be delegated      Failed - Cr in normal range and within 360 days    Creatinine  Date Value Ref Range Status  05/25/2014 0.85 0.60 - 1.30 mg/dL Final   Creatinine, Ser  Date Value Ref Range Status  12/21/2023 1.82 (H) 0.76 - 1.27 mg/dL Final         Passed - HCT in normal range and within 360 days    Hematocrit  Date Value Ref Range Status  12/21/2023 43.4 37.5 - 51.0 % Final         Passed - HGB in normal range and within 360 days    Hemoglobin  Date  Value Ref Range Status  12/21/2023 13.6 13.0 - 17.7 g/dL Final         Passed - PLT in normal range and within 360 days    Platelets  Date Value Ref Range Status  12/21/2023 289 150 - 450 x10E3/uL Final         Passed - Valid encounter within last 12 months    Recent Outpatient Visits           5 days ago H/O mechanical aortic valve replacement   [place] [facility] [place], [name], DO   1 week ago H/O mechanical aortic valve replacement   [place] [facility] [place], [name], DO   2 weeks ago H/O mechanical aortic valve replacement   [place] [facility] [place], [name], DO   3 weeks ago H/O mechanical aortic valve replacement   [place] [facility] [place], [name], DO   3 weeks ago H/O mechanical aortic valve replacement   [place] [facility] [place], Duwaine SQUIBB, DO       Future Appointments             In 2 months End, [name], MD [facility] at [facility]

## 2023-12-26 ENCOUNTER — Ambulatory Visit (INDEPENDENT_AMBULATORY_CARE_PROVIDER_SITE_OTHER): Admitting: Pediatrics

## 2023-12-26 ENCOUNTER — Encounter: Payer: Self-pay | Admitting: Pediatrics

## 2023-12-26 ENCOUNTER — Ambulatory Visit: Payer: Self-pay | Admitting: Pediatrics

## 2023-12-26 ENCOUNTER — Other Ambulatory Visit
Admission: RE | Admit: 2023-12-26 | Discharge: 2023-12-26 | Disposition: A | Discharge status: Home / Self Care | Attending: Cardiovascular Disease | Admitting: Cardiovascular Disease

## 2023-12-26 VITALS — BP 94/60 | HR 89 | Ht 66.0 in | Wt 272.0 lb

## 2023-12-26 DIAGNOSIS — I4891 Unspecified atrial fibrillation: Secondary | ICD-10-CM

## 2023-12-26 DIAGNOSIS — Z7901 Long term (current) use of anticoagulants: Secondary | ICD-10-CM

## 2023-12-26 LAB — PROTIME-INR
INR: 3.9 — ABNORMAL HIGH (ref 0.8–1.2)
Prothrombin Time: 38.4 s — ABNORMAL HIGH (ref 11.4–15.2)

## 2023-12-26 LAB — COAGUCHEK XS/INR WAIVED
INR: 5.5 (ref 0.9–1.1)
Prothrombin Time: 66.4 s

## 2023-12-26 MED ORDER — WARFARIN SODIUM 3 MG PO TABS: 3.0000 mg | ORAL_TABLET | Freq: Every day | ORAL | 3 refills | Status: DC

## 2023-12-26 NOTE — Patient Instructions (Signed)
 Drop down to 3mg 

## 2023-12-26 NOTE — Progress Notes (Signed)
 Office Visit  BP 94/60 (BP Location: Left Arm, Patient Position: Sitting, Cuff Size: Large)   Pulse 89   Ht 5' 6 (1.676 m)   Wt 272 lb (123.4 kg)   SpO2 95%   BMI 43.90 kg/m    Subjective:    Patient ID: Roberto [name], male    DOB: March 08, 1956, 68 y.o.   MRN: [id number]  HPI: Roberto [name] is a 68 y.o. male  Chief Complaint  Patient presents with   INR CHECK    Discussed the use of AI scribe software for clinical note transcription with the patient, who gave verbal consent to proceed.  History of Present Illness   Roberto [name] is a 68 year old male who presents for an INR check.  He is currently taking 4.5 mg of warfarin daily and has some 3 mg tablets at home. There was a previous issue with the home INR machine, but a new one has been obtained. However, due to frequent visits to the clinic, home testing is not currently being done. His INR is elevated today.  He has a procedure scheduled for tomorrow, referred to as a 'jump start' for his heart. He mentions having had labs drawn this morning at the hospital, with some results still pending. He is scheduled to be at the hospital before 6:30 AM for the procedure.  He has a busy schedule with upcoming appointments, including a pulmonology visit in [facility] and a psychologist appointment from the [place]. He has enough 3 mg tablets to last a few days.      Relevant past medical, surgical, family and social history reviewed and updated as indicated. Interim medical history since our last visit reviewed. Allergies and medications reviewed and updated.  ROS per HPI unless specifically indicated above     Objective:    BP 94/60 (BP Location: Left Arm, Patient Position: Sitting, Cuff Size: Large)   Pulse 89   Ht 5' 6 (1.676 m)   Wt 272 lb (123.4 kg)   SpO2 95%   BMI 43.90 kg/m   Wt Readings from Last 3 Encounters:  12/26/23 272 lb (123.4 kg)  12/21/23 268 lb 12.8 oz (121.9 kg)  12/20/23 256 lb (116.1 kg)      Physical Exam Constitutional:      Appearance: Normal appearance.  Pulmonary:     Effort: Pulmonary effort is normal.   Musculoskeletal:        General: Normal range of motion.   Skin:    Comments: Normal skin color   Neurological:     General: No focal deficit present.     Mental Status: He is alert. Mental status is at baseline.   Psychiatric:        Mood and Affect: Mood normal.        Behavior: Behavior normal.        Thought Content: Thought content normal.         11/15/2023    8:14 AM 11/12/2023    8:13 AM 11/06/2023   10:37 AM 10/24/2023   11:09 AM 10/19/2023    4:00 PM  Depression screen PHQ 2/9  Decreased Interest 3 0 0 0 0  Down, Depressed, Hopeless 2 2 0 1 1  PHQ - 2 Score 5 2 0 1 1  Altered sleeping 3 2 0    Tired, decreased energy 2 2 0    Change in appetite 2 2 0    Feeling bad or failure about yourself  3 2  0    Trouble concentrating 3 3 0    Moving slowly or fidgety/restless 3 2 0    Suicidal thoughts 2 2 0    PHQ-9 Score 23 17 0    Difficult doing work/chores Very difficult Very difficult Not difficult at all         11/15/2023    8:24 AM 11/12/2023    8:13 AM 11/06/2023   10:37 AM 10/19/2023    4:00 PM  GAD 7 : Generalized Anxiety Score  Nervous, Anxious, on Edge 3 3 0 0  Control/stop worrying 3 3 0 0  Worry too much - different things 3 3 0 0  Trouble relaxing 3 3 0 0  Restless 2 2 0 0  Easily annoyed or irritable 2 3 0 0  Afraid - awful might happen 3 2 0 0  Total GAD 7 Score 19 19 0 0  Anxiety Difficulty Very difficult Very difficult Not difficult at all Not difficult at all       Assessment & Plan:  Assessment & Plan   Atrial fibrillation, unspecified type (HCC) Anticoagulated on Coumadin  INR 5.5. Elevated on 4.5 mg warfarin. Dosage reduced to 3 mg to achieve therapeutic INR.  - Reduce warfarin to 3 mg daily. Cardioversion scheduled for tomorrow. - Order INR check in one week. - Message cardiologist regarding INR adjustment and  procedure. -     CoaguChek XS/INR Waived -     Warfarin Sodium ; Take 1 tablet (3 mg total) by mouth daily.  Dispense: 30 tablet; Refill: 3   Follow up plan: Return in about 1 week (around 01/02/2024).  Hadassah SHAUNNA Nett, MD

## 2023-12-27 ENCOUNTER — Ambulatory Visit: Admitting: Family Medicine

## 2023-12-27 ENCOUNTER — Ambulatory Visit
Admission: RE | Admit: 2023-12-27 | Discharge: 2023-12-27 | Disposition: A | Discharge status: Home / Self Care | Source: Home / Self Care | Attending: Cardiology | Admitting: Cardiology

## 2023-12-27 ENCOUNTER — Ambulatory Visit: Admitting: Anesthesiology

## 2023-12-27 ENCOUNTER — Ambulatory Visit
Admission: RE | Admit: 2023-12-27 | Discharge: 2023-12-27 | Disposition: A | Discharge status: Home / Self Care | Attending: Cardiovascular Disease | Admitting: Cardiovascular Disease

## 2023-12-27 ENCOUNTER — Encounter
Admission: RE | Disposition: A | Discharge status: Home / Self Care | Payer: Self-pay | Source: Home / Self Care | Attending: Cardiovascular Disease

## 2023-12-27 DIAGNOSIS — I4891 Unspecified atrial fibrillation: Secondary | ICD-10-CM | POA: Diagnosis present

## 2023-12-27 DIAGNOSIS — I130 Hypertensive heart and chronic kidney disease with heart failure and stage 1 through stage 4 chronic kidney disease, or unspecified chronic kidney disease: Secondary | ICD-10-CM | POA: Insufficient documentation

## 2023-12-27 DIAGNOSIS — Z539 Procedure and treatment not carried out, unspecified reason: Secondary | ICD-10-CM | POA: Diagnosis not present

## 2023-12-27 DIAGNOSIS — I5022 Chronic systolic (congestive) heart failure: Secondary | ICD-10-CM | POA: Diagnosis not present

## 2023-12-27 DIAGNOSIS — I4819 Other persistent atrial fibrillation: Secondary | ICD-10-CM

## 2023-12-27 DIAGNOSIS — F17210 Nicotine dependence, cigarettes, uncomplicated: Secondary | ICD-10-CM | POA: Diagnosis not present

## 2023-12-27 DIAGNOSIS — N183 Chronic kidney disease, stage 3 unspecified: Secondary | ICD-10-CM | POA: Insufficient documentation

## 2023-12-27 SURGERY — ECHOCARDIOGRAM, TRANSESOPHAGEAL
Anesthesia: General

## 2023-12-27 MED ORDER — SODIUM CHLORIDE 0.9% FLUSH: 3.0000 mL | Freq: Two times a day (BID) | INTRAVENOUS | Status: DC

## 2023-12-27 MED ORDER — PROPOFOL 1000 MG/100ML IV EMUL
INTRAVENOUS | Status: AC
  Filled 2023-12-27: qty 100

## 2023-12-27 MED ORDER — SODIUM CHLORIDE 0.9% FLUSH: 3.0000 mL | INTRAVENOUS | Status: DC | PRN

## 2023-12-28 ENCOUNTER — Other Ambulatory Visit: Payer: Self-pay | Admitting: Family Medicine

## 2023-12-28 ENCOUNTER — Encounter: Admitting: Family

## 2023-12-28 ENCOUNTER — Telehealth: Payer: Self-pay | Admitting: *Deleted

## 2023-12-28 NOTE — Progress Notes (Signed)
 Complex Care Management Care Guide Note  12/28/2023 Name: Duncan Alejandro MRN: [id number] DOB: 1955/10/05  Thurlow Danielson is a 68 y.o. year old male who is a primary care patient of [name], DO and is actively engaged with the care management team. I reached out to Sovereign Barbara by phone today to assist with re-scheduling  with the Licensed Clinical Child psychotherapist.  Follow up plan: pt and caregiver declined to reschedule at this time   Thedford Franks, CMA [facility]  [facility] Guide Direct Dial: [phone]  Fax: [phone] Website: [email or web address]

## 2023-12-29 LAB — PROTIME-INR: INR: 5.5 — AB (ref 0.80–1.20)

## 2023-12-30 ENCOUNTER — Ambulatory Visit: Payer: Self-pay | Admitting: Internal Medicine

## 2023-12-31 ENCOUNTER — Telehealth: Payer: Self-pay | Admitting: Cardiovascular Disease

## 2023-12-31 ENCOUNTER — Telehealth: Payer: Self-pay

## 2023-12-31 NOTE — Telephone Encounter (Signed)
 Copied from CRM [id number]. Topic: Clinical - Medical Advice >> Dec 31, 2023 10:49 AM Montie POUR wrote: Reason for CRM:  Kelly called with an out of range  INR. On 6/28, his INR was 5.5. Please call if you have any questions at [phone].

## 2023-12-31 NOTE — Telephone Encounter (Signed)
 Spoke with patients caregiver and she discussed series of events that happened at his most recent DCCV that didn't happen. They ([facility]) called Dr. Gollan to come in because he was not even there at the hospital and then they were told it was canceled. She said someone should have know this sooner and called to cancel with them. They felt it was irresponsible and made patient very uncomfortable with having anything done at [facility] now. Confirmed appointment for next week and she wants to talk with him before canceling or rescheduling. Sincere apologies given for their experience and that we will take care of him. She mentioned that they had it previously done in [place] and were happy with the experience there. Advised that we can certainly set it up for either here or there. She verbalized understanding and will call back and ask for me to discuss further steps.

## 2023-12-31 NOTE — Telephone Encounter (Signed)
 Called and spoke with patients caregiver. We rescheduled his follow up appointment. She verbalized understanding with no further questions at this time.

## 2023-12-31 NOTE — Telephone Encounter (Signed)
 No thank you

## 2023-12-31 NOTE — Telephone Encounter (Signed)
 Patient will need 4 weeks of therapeutic INR readings before we can reschedule his cardioversion.

## 2023-12-31 NOTE — Telephone Encounter (Signed)
 Left voicemail message to call back

## 2023-12-31 NOTE — Telephone Encounter (Signed)
  Pt sent a [employer];  Comments: [facility] said I needed a cardioversion and then said my INR was too high and when I went to the hospital that morning to get it done they refused to do it. I need to know what I need to do. They say I'm in A- fib.

## 2024-01-03 ENCOUNTER — Encounter: Payer: Self-pay | Admitting: Family Medicine

## 2024-01-03 ENCOUNTER — Ambulatory Visit (INDEPENDENT_AMBULATORY_CARE_PROVIDER_SITE_OTHER): Admitting: Family Medicine

## 2024-01-03 VITALS — BP 104/70 | HR 89 | Temp 98.6°F | Resp 15 | Ht 65.98 in | Wt 268.2 lb

## 2024-01-03 DIAGNOSIS — Z952 Presence of prosthetic heart valve: Secondary | ICD-10-CM

## 2024-01-03 LAB — COAGUCHEK XS/INR WAIVED
INR: 2.4 — ABNORMAL HIGH (ref 0.9–1.1)
Prothrombin Time: 28.6 s

## 2024-01-03 NOTE — Progress Notes (Signed)
 BP 104/70 (BP Location: Left Arm, Patient Position: Sitting, Cuff Size: Large)   Pulse 89   Temp 98.6 F (37 C) (Oral)   Resp 15   Ht 5' 5.98 (1.676 m)   Wt 268 lb 3.2 oz (121.7 kg)   SpO2 96%   BMI 43.31 kg/m    Subjective:    Patient ID: Roberto [name], male    DOB: 1955-09-19, 68 y.o.   MRN: [id number]  CC: Coumadin  management  HPI: This patient is a 68 y.o. male who presents for coumadin  management. The expected duration of coumadin  treatment is lifelong The reason for anticoagulation is  mechanical heart valve.  Present Coumadin  dose: 3mg  daily Goal:  2.5-3.5 Excessive bruising: no Nose bleeding: no Rectal bleeding: no Prolonged menstrual cycles: N/A Eating diet with consistent amounts of foods containing Vitamin K:yes Any recent antibiotic use? no  Relevant past medical, surgical, family and social history reviewed and updated as indicated. Interim medical history since our last visit reviewed. Allergies and medications reviewed and updated.  Review of Systems  Constitutional: Negative.   Respiratory: Negative.    Cardiovascular: Negative.   Musculoskeletal: Negative.   Neurological: Negative.   Psychiatric/Behavioral: Negative.          Objective:    BP 104/70 (BP Location: Left Arm, Patient Position: Sitting, Cuff Size: Large)   Pulse 89   Temp 98.6 F (37 C) (Oral)   Resp 15   Ht 5' 5.98 (1.676 m)   Wt 268 lb 3.2 oz (121.7 kg)   SpO2 96%   BMI 43.31 kg/m   Wt Readings from Last 3 Encounters:  01/03/24 268 lb 3.2 oz (121.7 kg)  12/26/23 272 lb (123.4 kg)  12/21/23 268 lb 12.8 oz (121.9 kg)     Physical Exam Vitals and nursing note reviewed.  Constitutional:      General: He is not in acute distress.    Appearance: Normal appearance. He is well-developed.  HENT:     Head: Normocephalic and atraumatic.     Right Ear: Hearing and external ear normal.     Left Ear: Hearing and external ear normal.     Nose: Nose normal.     Mouth/Throat:      Mouth: Mucous membranes are moist.     Pharynx: Oropharynx is clear.  Eyes:     General: Lids are normal. No scleral icterus.       Right eye: No discharge.        Left eye: No discharge.     Conjunctiva/sclera: Conjunctivae normal.  Pulmonary:     Effort: Pulmonary effort is normal. No respiratory distress.  Musculoskeletal:        General: Normal range of motion.  Skin:    Coloration: Skin is not jaundiced or pale.     Findings: No bruising, erythema, lesion or rash.  Neurological:     Mental Status: He is alert. Mental status is at baseline. He is disoriented.  Psychiatric:        Mood and Affect: Mood normal.        Speech: Speech normal.        Behavior: Behavior normal.        Thought Content: Thought content normal.        Judgment: Judgment normal.      Last INR: 2.4 Last PT: 28.6    Last CBC:  Lab Results  Component Value Date   WBC 9.0 12/21/2023   HGB 13.6 12/21/2023   HCT  43.4 12/21/2023   MCV 93 12/21/2023   PLT 289 12/21/2023    Results for orders placed or performed during the hospital encounter of 12/26/23  Protime-INR   Collection Time: 12/26/23  7:48 AM  Result Value Ref Range   Prothrombin Time 38.4 (H) 11.4 - 15.2 seconds   INR 3.9 (H) 0.8 - 1.2   *Note: Due to a large number of results and/or encounters for the requested time period, some results have not been displayed. A complete set of results can be found in Results Review.       Assessment:   Problem List Items Addressed This Visit       Other   H/O mechanical aortic valve replacement - Primary   Just under range at INR of 2.4 (should be 2.5-3.5) Will remain on current dose and recheck next week as he tends to have big swings. Call with any concerns.       Relevant Orders   CoaguChek XS/INR Waived      Plan:   Discussed current plan face-to-face with patient. For coumadin  dosing, elected to continue current dose. Will plan to recheck INR in 1 week.

## 2024-01-03 NOTE — Patient Instructions (Signed)
Be Involved in Caring For Your Health:  Taking Medications When medications are taken as directed, they can greatly improve your health. But if they are not taken as prescribed, they may not work. In some cases, not taking them correctly can be harmful. To help ensure your treatment remains effective and safe, understand your medications and how to take them. Bring your medications to each visit for review by your provider.  Your lab results, notes, and after visit summary will be available on My Chart. We strongly encourage you to use this feature. If lab results are abnormal the clinic will contact you with the appropriate steps. If the clinic does not contact you assume the results are satisfactory. You can always view your results on My Chart. If you have questions regarding your health or results, please contact the clinic during office hours. You can also ask questions on My Chart.  We at [facility] are grateful that you chose [place] to provide your care. We strive to provide evidence-based and compassionate care and are always looking for feedback. If you get a survey from the clinic please complete this so we can hear your opinions.

## 2024-01-03 NOTE — Assessment & Plan Note (Signed)
 Just under range at INR of 2.4 (should be 2.5-3.5) Will remain on current dose and recheck next week as he tends to have big swings. Call with any concerns.

## 2024-01-10 ENCOUNTER — Ambulatory Visit: Admitting: Cardiology

## 2024-01-10 ENCOUNTER — Ambulatory Visit (INDEPENDENT_AMBULATORY_CARE_PROVIDER_SITE_OTHER): Admitting: Nurse Practitioner

## 2024-01-10 ENCOUNTER — Encounter: Payer: Self-pay | Admitting: Nurse Practitioner

## 2024-01-10 ENCOUNTER — Encounter: Payer: Self-pay | Admitting: Family Medicine

## 2024-01-10 ENCOUNTER — Ambulatory Visit: Payer: Self-pay | Admitting: Nurse Practitioner

## 2024-01-10 VITALS — BP 107/72 | HR 84 | Temp 98.2°F | Ht 66.0 in | Wt 268.0 lb

## 2024-01-10 DIAGNOSIS — Z952 Presence of prosthetic heart valve: Secondary | ICD-10-CM

## 2024-01-10 LAB — COAGUCHEK XS/INR WAIVED
INR: 1.7 — ABNORMAL HIGH (ref 0.9–1.1)
Prothrombin Time: 20 s

## 2024-01-10 NOTE — Assessment & Plan Note (Signed)
 Under range at INR at 1.7 (should be 2.5-3.5) Will remain on current dose due to recent intake of foods with K.  Discussed with patient that it is important to be consistent with his food intake.  Recheck next week as he tends to have big swings. Call with any concerns.

## 2024-01-10 NOTE — Progress Notes (Signed)
 BP 107/72   Pulse 84   Temp 98.2 F (36.8 C) (Oral)   Ht 5' 6 (1.676 m)   Wt 268 lb (121.6 kg)   SpO2 98%   BMI 43.26 kg/m    Subjective:    Patient ID: Roberto [name], male    DOB: [birth year], 69 y.o.   MRN: [id number]  CC: Coumadin  management  HPI: This patient is a 68 y.o. male who presents for coumadin  management. The expected duration of coumadin  treatment is lifelong The reason for anticoagulation is  mechanical heart valve.  Present Coumadin  dose: 3mg  daily INR today was 1.7.  Patient has been eating a lot of Mayonaise Goal:  2.5-3.5 Excessive bruising: no Nose bleeding: no Rectal bleeding: no Prolonged menstrual cycles: N/A Eating diet with consistent amounts of foods containing Vitamin K:yes Any recent antibiotic use? no  Relevant past medical, surgical, family and social history reviewed and updated as indicated. Interim medical history since our last visit reviewed. Allergies and medications reviewed and updated.  Review of Systems  Constitutional: Negative.   Respiratory: Negative.    Cardiovascular: Negative.   Musculoskeletal: Negative.   Neurological: Negative.   Psychiatric/Behavioral: Negative.          Objective:    BP 107/72   Pulse 84   Temp 98.2 F (36.8 C) (Oral)   Ht 5' 6 (1.676 m)   Wt 268 lb (121.6 kg)   SpO2 98%   BMI 43.26 kg/m   Wt Readings from Last 3 Encounters:  01/10/24 268 lb (121.6 kg)  01/03/24 268 lb 3.2 oz (121.7 kg)  12/26/23 272 lb (123.4 kg)     Physical Exam Vitals and nursing note reviewed.  Constitutional:      General: He is not in acute distress.    Appearance: Normal appearance. He is well-developed.  HENT:     Head: Normocephalic and atraumatic.     Right Ear: Hearing and external ear normal.     Left Ear: Hearing and external ear normal.     Nose: Nose normal.     Mouth/Throat:     Mouth: Mucous membranes are moist.     Pharynx: Oropharynx is clear.  Eyes:     General: Lids are normal. No  scleral icterus.       Right eye: No discharge.        Left eye: No discharge.     Conjunctiva/sclera: Conjunctivae normal.  Pulmonary:     Effort: Pulmonary effort is normal. No respiratory distress.  Musculoskeletal:        General: Normal range of motion.  Skin:    Coloration: Skin is not jaundiced or pale.     Findings: No bruising, erythema, lesion or rash.  Neurological:     Mental Status: He is alert. Mental status is at baseline. He is disoriented.  Psychiatric:        Mood and Affect: Mood normal.        Speech: Speech normal.        Behavior: Behavior normal.        Thought Content: Thought content normal.        Judgment: Judgment normal.      Last INR: 2.4 Last PT: 28.6    Last CBC:  Lab Results  Component Value Date   WBC 9.0 12/21/2023   HGB 13.6 12/21/2023   HCT 43.4 12/21/2023   MCV 93 12/21/2023   PLT 289 12/21/2023    Results for orders placed or performed  in visit on 01/08/24  Protime-INR   Collection Time: 12/29/23 12:00 AM  Result Value Ref Range   INR 5.50 (A) 0.80 - 1.20   *Note: Due to a large number of results and/or encounters for the requested time period, some results have not been displayed. A complete set of results can be found in Results Review.       Assessment:   Problem List Items Addressed This Visit       Other   H/O mechanical aortic valve replacement - Primary   Under range at INR at 1.7 (should be 2.5-3.5) Will remain on current dose due to recent intake of foods with K.  Discussed with patient that it is important to be consistent with his food intake.  Recheck next week as he tends to have big swings. Call with any concerns.       Relevant Orders   CoaguChek XS/INR Waived      Plan:   Discussed current plan face-to-face with patient. For coumadin  dosing, elected to continue current dose. Will plan to recheck INR in 1 week.

## 2024-01-16 ENCOUNTER — Telehealth: Payer: Self-pay

## 2024-01-17 ENCOUNTER — Telehealth: Payer: Self-pay

## 2024-01-17 ENCOUNTER — Other Ambulatory Visit: Payer: Self-pay | Admitting: Physician Assistant

## 2024-01-17 ENCOUNTER — Other Ambulatory Visit: Payer: Self-pay | Admitting: Family Medicine

## 2024-01-17 ENCOUNTER — Ambulatory Visit: Admitting: Nurse Practitioner

## 2024-01-17 NOTE — Progress Notes (Deleted)
 There were no vitals taken for this visit.   Subjective:    Patient ID: Roberto [name], male    DOB: 1955-12-04, 68 y.o.   MRN: [id number]  CC: Coumadin  management  HPI: This patient is a 68 y.o. male who presents for coumadin  management. The expected duration of coumadin  treatment is lifelong The reason for anticoagulation is  mechanical heart valve.  Present Coumadin  dose: 3mg  daily INR today was 1.7.  Patient has been eating a lot of Mayonaise Goal:  2.5-3.5 Excessive bruising: no Nose bleeding: no Rectal bleeding: no Prolonged menstrual cycles: N/A Eating diet with consistent amounts of foods containing Vitamin K:yes Any recent antibiotic use? no  Relevant past medical, surgical, family and social history reviewed and updated as indicated. Interim medical history since our last visit reviewed. Allergies and medications reviewed and updated.  Review of Systems  Constitutional: Negative.   Respiratory: Negative.    Cardiovascular: Negative.   Musculoskeletal: Negative.   Neurological: Negative.   Psychiatric/Behavioral: Negative.          Objective:    There were no vitals taken for this visit.  Wt Readings from Last 3 Encounters:  01/10/24 268 lb (121.6 kg)  01/03/24 268 lb 3.2 oz (121.7 kg)  12/26/23 272 lb (123.4 kg)     Physical Exam Vitals and nursing note reviewed.  Constitutional:      General: He is not in acute distress.    Appearance: Normal appearance. He is well-developed.  HENT:     Head: Normocephalic and atraumatic.     Right Ear: Hearing and external ear normal.     Left Ear: Hearing and external ear normal.     Nose: Nose normal.     Mouth/Throat:     Mouth: Mucous membranes are moist.     Pharynx: Oropharynx is clear.  Eyes:     General: Lids are normal. No scleral icterus.       Right eye: No discharge.        Left eye: No discharge.     Conjunctiva/sclera: Conjunctivae normal.  Pulmonary:     Effort: Pulmonary effort is normal. No  respiratory distress.  Musculoskeletal:        General: Normal range of motion.  Skin:    Coloration: Skin is not jaundiced or pale.     Findings: No bruising, erythema, lesion or rash.  Neurological:     Mental Status: He is alert. Mental status is at baseline. He is disoriented.  Psychiatric:        Mood and Affect: Mood normal.        Speech: Speech normal.        Behavior: Behavior normal.        Thought Content: Thought content normal.        Judgment: Judgment normal.      Last INR: 2.4 Last PT: 28.6    Last CBC:  Lab Results  Component Value Date   WBC 9.0 12/21/2023   HGB 13.6 12/21/2023   HCT 43.4 12/21/2023   MCV 93 12/21/2023   PLT 289 12/21/2023    Results for orders placed or performed in visit on 01/10/24  CoaguChek XS/INR Waived   Collection Time: 01/10/24  8:37 AM  Result Value Ref Range   INR 1.7 (H) 0.9 - 1.1   Prothrombin Time 20.0 sec   *Note: Due to a large number of results and/or encounters for the requested time period, some results have not been displayed. A complete  set of results can be found in Results Review.       Assessment:   Problem List Items Addressed This Visit   None      Plan:   Discussed current plan face-to-face with patient. For coumadin  dosing, elected to continue current dose. Will plan to recheck INR in 1 week.

## 2024-01-17 NOTE — Telephone Encounter (Signed)
 If he's not able to get up for 3 days, I would advise him to go to the ER for fluids and labs.

## 2024-01-17 NOTE — Telephone Encounter (Signed)
 Patient missed todays appointment and I was asked to call and check in with patient. I reached patients friend [name] and she explains he has been unable to walk the last 3 days. He told her it was related to sciatica however [name] feels its more related to his kidneys. She is aware he needs to be seen but will need to push harder to get him to come in. Says for the last 3 days he has continued to drink and use the restroom and that is when he is getting up and using his walker but otherwise he remains in his bed.   I advised [name] that I would talk with the PCP and call her back with an update. She will also plan to do his INR tonight at home and give results to MD INR.

## 2024-01-17 NOTE — Telephone Encounter (Signed)
 Left messages for Roberto [name].

## 2024-01-18 ENCOUNTER — Other Ambulatory Visit: Payer: Self-pay | Admitting: Family Medicine

## 2024-01-18 LAB — POCT INR: POC INR: 2.1

## 2024-01-18 NOTE — Telephone Encounter (Signed)
 Requested Prescriptions  Pending Prescriptions Disp Refills   nortriptyline  (PAMELOR ) 10 MG capsule [Pharmacy Med Name: Nortriptyline  HCl 10 MG Oral Capsule] 90 capsule 0    Sig: Take 1 capsule by mouth at bedtime     Psychiatry:  Antidepressants - Heterocyclics (TCAs) Passed - 01/18/2024  1:16 PM      Passed - Completed PHQ-2 or PHQ-9 in the last 360 days      Passed - Valid encounter within last 6 months    Recent Outpatient Visits           1 week ago H/O mechanical aortic valve replacement   [place] [facility] Melvin Pao, NP   2 weeks ago H/O mechanical aortic valve replacement   [place], [name], DO   3 weeks ago Atrial fibrillation, unspecified type [facility])   [place] Herold Hadassah SQUIBB, MD   4 weeks ago H/O mechanical aortic valve replacement   [place] [facility] [place], [name], DO   1 month ago H/O mechanical aortic valve replacement   [place] [facility] [place], Duwaine SQUIBB, DO       Future Appointments             In 2 weeks Riddle, Suzann, NP [place] [facility] [place]   In 2 months End, [name], MD [facility]

## 2024-01-21 NOTE — Telephone Encounter (Signed)
 Requested medication (s) are due for refill today - unsure  Requested medication (s) are on the active medication list -yes  Future visit scheduled -no  Last refill: 07/23/23  Notes to clinic: listed as historical medictaion  Requested Prescriptions  Pending Prescriptions Disp Refills   OZEMPIC , 1 MG/DOSE, 4 MG/3ML SOPN [Pharmacy Med Name: Ozempic  (1 MG/DOSE) 4 MG/3ML Subcutaneous Solution Pen-injector] 3 mL 0    Sig: INJECT 1 MG UNDER THE SKIN ONCE WEEKLY     Endocrinology:  Diabetes - GLP-1 Receptor Agonists - semaglutide  Failed - 01/21/2024  2:57 PM      Failed - HBA1C in normal range and within 180 days    Hemoglobin A1C  Date Value Ref Range Status  04/19/2016 6.6  Final   HB A1C (BAYER DCA - WAIVED)  Date Value Ref Range Status  09/11/2023 6.1 (H) 4.8 - 5.6 % Final    Comment:             Prediabetes: 5.7 - 6.4          Diabetes: >6.4          Glycemic control for adults with diabetes: <7.0          Failed - Cr in normal range and within 360 days    Creatinine  Date Value Ref Range Status  05/25/2014 0.85 0.60 - 1.30 mg/dL Final   Creatinine, Ser  Date Value Ref Range Status  12/21/2023 1.82 (H) 0.76 - 1.27 mg/dL Final         Passed - Valid encounter within last 6 months    Recent Outpatient Visits           1 week ago H/O mechanical aortic valve replacement   [facility] [facility] Melvin Pao, NP   2 weeks ago H/O mechanical aortic valve replacement   [place] [facility] [place], [name], DO   3 weeks ago Atrial fibrillation, unspecified type [facility])   [place] [facility] Herold Hadassah SQUIBB, MD   1 month ago H/O mechanical aortic valve replacement   [facility] [place], [name], DO   1 month ago H/O mechanical aortic valve replacement   [place] [facility] [place], Duwaine SQUIBB, DO       Future Appointments             In 2 weeks Riddle, Suzann, NP [place]  [facility] [place]   In 1 month End, [name], MD [place] [facility]               Requested Prescriptions  Pending Prescriptions Disp Refills   OZEMPIC , 1 MG/DOSE, 4 MG/3ML SOPN [Pharmacy Med Name: Ozempic  (1 MG/DOSE) 4 MG/3ML Subcutaneous Solution Pen-injector] 3 mL 0    Sig: INJECT 1 MG UNDER THE SKIN ONCE WEEKLY     Endocrinology:  Diabetes - GLP-1 Receptor Agonists - semaglutide  Failed - 01/21/2024  2:57 PM      Failed - HBA1C in normal range and within 180 days    Hemoglobin A1C  Date Value Ref Range Status  04/19/2016 6.6  Final   HB A1C (BAYER DCA - WAIVED)  Date Value Ref Range Status  09/11/2023 6.1 (H) 4.8 - 5.6 % Final    Comment:             Prediabetes: 5.7 - 6.4          Diabetes: >6.4  Glycemic control for adults with diabetes: <7.0          Failed - Cr in normal range and within 360 days    Creatinine  Date Value Ref Range Status  05/25/2014 0.85 0.60 - 1.30 mg/dL Final   Creatinine, Ser  Date Value Ref Range Status  12/21/2023 1.82 (H) 0.76 - 1.27 mg/dL Final         Passed - Valid encounter within last 6 months    Recent Outpatient Visits           1 week ago H/O mechanical aortic valve replacement   [place] [facility] Melvin Pao, NP   2 weeks ago H/O mechanical aortic valve replacement   [facility] [place], [name], DO   3 weeks ago Atrial fibrillation, unspecified type [facility])   [place] Herold Hadassah SQUIBB, MD   1 month ago H/O mechanical aortic valve replacement   [place] [facility] [place], [name], DO   1 month ago H/O mechanical aortic valve replacement   [place] [facility] [place], Duwaine SQUIBB, DO       Future Appointments             In 2 weeks Riddle, Suzann, NP [facility] [place]   In 1 month End, [name], MD [facility]

## 2024-01-23 ENCOUNTER — Encounter: Payer: Self-pay | Admitting: Family Medicine

## 2024-01-23 ENCOUNTER — Ambulatory Visit: Payer: Self-pay | Admitting: Family Medicine

## 2024-01-23 ENCOUNTER — Telehealth: Payer: Self-pay | Admitting: Family Medicine

## 2024-01-23 ENCOUNTER — Ambulatory Visit (INDEPENDENT_AMBULATORY_CARE_PROVIDER_SITE_OTHER): Admitting: Family Medicine

## 2024-01-23 VITALS — BP 89/64 | HR 49 | Temp 98.2°F | Ht 66.0 in | Wt 264.0 lb

## 2024-01-23 DIAGNOSIS — W57XXXA Bitten or stung by nonvenomous insect and other nonvenomous arthropods, initial encounter: Secondary | ICD-10-CM | POA: Diagnosis not present

## 2024-01-23 DIAGNOSIS — M5432 Sciatica, left side: Secondary | ICD-10-CM

## 2024-01-23 DIAGNOSIS — E1159 Type 2 diabetes mellitus with other circulatory complications: Secondary | ICD-10-CM

## 2024-01-23 DIAGNOSIS — I152 Hypertension secondary to endocrine disorders: Secondary | ICD-10-CM

## 2024-01-23 DIAGNOSIS — S80869A Insect bite (nonvenomous), unspecified lower leg, initial encounter: Secondary | ICD-10-CM | POA: Diagnosis not present

## 2024-01-23 DIAGNOSIS — Z952 Presence of prosthetic heart valve: Secondary | ICD-10-CM

## 2024-01-23 LAB — COAGUCHEK XS/INR WAIVED
INR: 2.6 — ABNORMAL HIGH (ref 0.9–1.1)
Prothrombin Time: 31 s

## 2024-01-23 LAB — MICROALBUMIN, URINE WAIVED
Creatinine, Urine Waived: 50 mg/dL (ref 10–300)
Microalb, Ur Waived: 30 mg/L — ABNORMAL HIGH (ref 0–19)

## 2024-01-23 MED ORDER — TRIAMCINOLONE ACETONIDE 40 MG/ML IJ SUSP: 40.0000 mg | Freq: Once | INTRAMUSCULAR | Status: DC

## 2024-01-23 MED ORDER — BACLOFEN 10 MG PO TABS: 5.0000 mg | ORAL_TABLET | Freq: Every evening | ORAL | 0 refills | Status: DC | PRN

## 2024-01-23 MED ORDER — METHYLPREDNISOLONE SODIUM SUCC 40 MG IJ SOLR
40.0000 mg | Freq: Once | INTRAMUSCULAR | Status: AC
  Administered 2024-01-23: 40 mg via INTRAMUSCULAR

## 2024-01-23 MED ORDER — PREDNISONE 10 MG PO TABS: ORAL_TABLET | ORAL | 0 refills | Status: DC

## 2024-01-23 NOTE — Assessment & Plan Note (Signed)
 In range with INR of 2.6 on 3/3.5mg  alternating. Will continue current regimen and recheck in 1 week.

## 2024-01-23 NOTE — Telephone Encounter (Signed)
 Called the patient and caregiver, they will come in to get the records release form to complete.

## 2024-01-23 NOTE — Progress Notes (Signed)
 BP (!) 89/64   Pulse (!) 49   Temp 98.2 F (36.8 C) (Oral)   Ht 5' 6 (1.676 m)   Wt 264 lb (119.7 kg)   SpO2 95%   BMI 42.61 kg/m    Subjective:    Patient ID: Roberto [name], male    DOB: 09/03/1955, 68 y.o.   MRN: [id number]  HPI: Roberto [name] is a 68 y.o. male  Chief Complaint  Patient presents with   INR CHECK   Leg Pain    Left leg. Onset about last week. Unable to walk last week to get to appointment. Concerns of gout.     Insect Bite    Fire ant attack to both lower legs   BACK PAIN Duration: 2 weeks Mechanism of injury: unknown Location: L leg and buttock Onset: sudden Severity: severe Quality: aching pain Frequency: constant Radiation: down L leg Aggravating factors: moving Alleviating factors: being off of it Status: better Treatments attempted: rest, ice, heat, and APAP  Relief with NSAIDs?: No NSAIDs Taken Nighttime pain:  yes Paresthesias / decreased sensation:  no Bowel / bladder incontinence:  no Fevers:  no Dysuria / urinary frequency:  no  Coumadin  Management.  The expected duration of coumadin  treatment is lifelong The reason for anticoagulation is  mechanical heart valve.  Present Coumadin  dose: 3/3.5 alternating Goal:  2.5-3.5 Excessive bruising: no Nose bleeding: no Rectal bleeding: no Prolonged menstrual cycles: N/A Eating diet with consistent amounts of foods containing Vitamin K:yes Any recent antibiotic use? no  Got bit by fire ants and now has a rash on both his legs that is irritated and red and itchy.   Relevant past medical, surgical, family and social history reviewed and updated as indicated. Interim medical history since our last visit reviewed. Allergies and medications reviewed and updated.  Review of Systems  Constitutional: Negative.   Respiratory: Negative.    Cardiovascular: Negative.   Gastrointestinal: Negative.   Musculoskeletal:  Positive for back pain and myalgias. Negative for arthralgias, gait  problem, joint swelling, neck pain and neck stiffness.  Neurological: Negative.   Psychiatric/Behavioral: Negative.      Per HPI unless specifically indicated above     Objective:    BP (!) 89/64   Pulse (!) 49   Temp 98.2 F (36.8 C) (Oral)   Ht 5' 6 (1.676 m)   Wt 264 lb (119.7 kg)   SpO2 95%   BMI 42.61 kg/m   Wt Readings from Last 3 Encounters:  01/23/24 264 lb (119.7 kg)  01/10/24 268 lb (121.6 kg)  01/03/24 268 lb 3.2 oz (121.7 kg)    Physical Exam Vitals and nursing note reviewed.  Constitutional:      General: He is not in acute distress.    Appearance: Normal appearance. He is not ill-appearing, toxic-appearing or diaphoretic.  HENT:     Head: Normocephalic and atraumatic.     Right Ear: External ear normal.     Left Ear: External ear normal.     Nose: Nose normal.     Mouth/Throat:     Mouth: Mucous membranes are moist.     Pharynx: Oropharynx is clear.  Eyes:     General: No scleral icterus.       Right eye: No discharge.        Left eye: No discharge.     Extraocular Movements: Extraocular movements intact.     Conjunctiva/sclera: Conjunctivae normal.     Pupils: Pupils are equal, round, and reactive  to light.  Cardiovascular:     Rate and Rhythm: Normal rate and regular rhythm.     Pulses: Normal pulses.     Heart sounds: Normal heart sounds. No murmur heard.    No friction rub. No gallop.  Pulmonary:     Effort: Pulmonary effort is normal. No respiratory distress.     Breath sounds: Normal breath sounds. No stridor. No wheezing, rhonchi or rales.  Chest:     Chest wall: No tenderness.  Musculoskeletal:        General: Normal range of motion.     Cervical back: Normal range of motion and neck supple.  Skin:    General: Skin is warm and dry.     Capillary Refill: Capillary refill takes less than 2 seconds.     Coloration: Skin is not jaundiced or pale.     Findings: No bruising, erythema, lesion or rash.  Neurological:     General: No focal  deficit present.     Mental Status: He is alert and oriented to person, place, and time. Mental status is at baseline.  Psychiatric:        Mood and Affect: Mood normal.        Behavior: Behavior normal.        Thought Content: Thought content normal.        Judgment: Judgment normal.     Results for orders placed or performed in visit on 01/23/24  Microalbumin, Urine Waived   Collection Time: 01/23/24  8:31 AM  Result Value Ref Range   Microalb, Ur Waived 30 (H) 0 - 19 mg/L   Creatinine, Urine Waived 50 10 - 300 mg/dL   Microalb/Creat Ratio 30-300 (H) <30 mg/g  CoaguChek XS/INR Waived   Collection Time: 01/23/24  8:36 AM  Result Value Ref Range   INR 2.6 (H) 0.9 - 1.1   Prothrombin Time 31.0 sec   *Note: Due to a large number of results and/or encounters for the requested time period, some results have not been displayed. A complete set of results can be found in Results Review.      Assessment & Plan:   Problem List Items Addressed This Visit       Cardiovascular and Mediastinum   Hypertension associated with diabetes (HCC)   Labs drawn today. Nephrology ordered microalbumin- ordered today.       Relevant Orders   Basic metabolic panel with GFR   Microalbumin, Urine Waived     Other   H/O mechanical aortic valve replacement - Primary   In range with INR of 2.6 on 3/3.5mg  alternating. Will continue current regimen and recheck in 1 week.       Relevant Orders   CoaguChek XS/INR Waived (Completed)   Other Visit Diagnoses       Sciatica of left side       Will treat with prednisone  and baclofen . Call if not getting better or getting worse. Stretches given.     Insect bite of lower leg, unspecified laterality, initial encounter       Will treat with prednisone  taper. Call with any concerns.   Relevant Medications   methylPREDNISolone  sodium succinate (SOLU-MEDROL ) 40 mg/mL injection 40 mg (Completed)        Follow up plan: Return in about 1 week (around  01/30/2024) for OK to double book.

## 2024-01-23 NOTE — Telephone Encounter (Unsigned)
 Copied from CRM [id number]. Topic: Medical Record Request - Records Request >> Jan 23, 2024 10:03 AM Donee H wrote: Reason for CRM: Patient's caregiver Suzen Custard called to request patient's medical records due to needing them to get assistance with [facility]. She is wanting to be able to pick them up as soon as possible. Requesting a follow up on request at [phone]

## 2024-01-23 NOTE — Assessment & Plan Note (Signed)
 Labs drawn today. Nephrology ordered microalbumin- ordered today.

## 2024-01-24 LAB — BASIC METABOLIC PANEL WITH GFR
BUN/Creatinine Ratio: 15 (ref 10–24)
BUN: 27 mg/dL (ref 8–27)
CO2: 21 mmol/L (ref 20–29)
Calcium: 9.4 mg/dL (ref 8.6–10.2)
Chloride: 101 mmol/L (ref 96–106)
Creatinine, Ser: 1.75 mg/dL — ABNORMAL HIGH (ref 0.76–1.27)
Glucose: 97 mg/dL (ref 70–99)
Potassium: 3.8 mmol/L (ref 3.5–5.2)
Sodium: 142 mmol/L (ref 134–144)
eGFR: 42 mL/min/1.73 — ABNORMAL LOW (ref >59)

## 2024-01-25 ENCOUNTER — Other Ambulatory Visit: Payer: Self-pay

## 2024-01-25 MED ORDER — TORSEMIDE 20 MG PO TABS: 20.0000 mg | ORAL_TABLET | Freq: Two times a day (BID) | ORAL | 2 refills | Status: DC

## 2024-01-31 ENCOUNTER — Ambulatory Visit (INDEPENDENT_AMBULATORY_CARE_PROVIDER_SITE_OTHER): Admitting: Family Medicine

## 2024-01-31 ENCOUNTER — Encounter: Payer: Self-pay | Admitting: Family Medicine

## 2024-01-31 VITALS — BP 108/69 | HR 106 | Temp 98.1°F | Wt 259.0 lb

## 2024-01-31 DIAGNOSIS — Z952 Presence of prosthetic heart valve: Secondary | ICD-10-CM | POA: Diagnosis not present

## 2024-01-31 LAB — COAGUCHEK XS/INR WAIVED
INR: 3.3 — ABNORMAL HIGH (ref 0.9–1.1)
Prothrombin Time: 40.1 s

## 2024-01-31 NOTE — Assessment & Plan Note (Signed)
 Therapeutic at 3.3. Continue current regimen. Continue to monitor. Call with any concerns.

## 2024-01-31 NOTE — Progress Notes (Signed)
 BP 108/69   Pulse (!) 106   Temp 98.1 F (36.7 C) (Oral)   Wt 259 lb (117.5 kg)   SpO2 96%   BMI 41.80 kg/m    Subjective:    Patient ID: Roberto [name], male    DOB: 1955-08-18, 68 y.o.   MRN: [id number]  HPI: Roberto [name] is a 68 y.o. male  Chief Complaint  Patient presents with   Coagulation Disorder        Coumadin  Management.  The expected duration of coumadin  treatment is lifelong The reason for anticoagulation is  mechanical heart valve.  Present Coumadin  dose: 3/3.5mg  alternating Goal: 2.5-3.5  Excessive bruising: no Nose bleeding: no Rectal bleeding: no Prolonged menstrual cycles: N/A Eating diet with consistent amounts of foods containing Vitamin K:yes Any recent antibiotic use? no  Relevant past medical, surgical, family and social history reviewed and updated as indicated. Interim medical history since our last visit reviewed. Allergies and medications reviewed and updated.  Review of Systems  Constitutional: Negative.   Respiratory: Negative.    Cardiovascular: Negative.   Musculoskeletal: Negative.   Neurological: Negative.   Psychiatric/Behavioral: Negative.      Per HPI unless specifically indicated above     Objective:    BP 108/69   Pulse (!) 106   Temp 98.1 F (36.7 C) (Oral)   Wt 259 lb (117.5 kg)   SpO2 96%   BMI 41.80 kg/m   Wt Readings from Last 3 Encounters:  01/31/24 259 lb (117.5 kg)  01/23/24 264 lb (119.7 kg)  01/10/24 268 lb (121.6 kg)    Physical Exam Vitals and nursing note reviewed.  Constitutional:      General: He is not in acute distress.    Appearance: Normal appearance. He is obese. He is not ill-appearing, toxic-appearing or diaphoretic.  HENT:     Head: Normocephalic and atraumatic.     Right Ear: External ear normal.     Left Ear: External ear normal.     Nose: Nose normal.     Mouth/Throat:     Mouth: Mucous membranes are moist.     Pharynx: Oropharynx is clear.  Eyes:     General: No scleral  icterus.       Right eye: No discharge.        Left eye: No discharge.     Extraocular Movements: Extraocular movements intact.     Conjunctiva/sclera: Conjunctivae normal.     Pupils: Pupils are equal, round, and reactive to light.  Cardiovascular:     Rate and Rhythm: Normal rate and regular rhythm.     Pulses: Normal pulses.     Heart sounds: Normal heart sounds. No murmur heard.    No friction rub. No gallop.  Pulmonary:     Effort: Pulmonary effort is normal. No respiratory distress.     Breath sounds: Normal breath sounds. No stridor. No wheezing, rhonchi or rales.  Chest:     Chest wall: No tenderness.  Musculoskeletal:        General: Normal range of motion.     Cervical back: Normal range of motion and neck supple.  Skin:    General: Skin is warm and dry.     Capillary Refill: Capillary refill takes less than 2 seconds.     Coloration: Skin is not jaundiced or pale.     Findings: No bruising, erythema, lesion or rash.  Neurological:     General: No focal deficit present.     Mental Status:  He is alert and oriented to person, place, and time. Mental status is at baseline.  Psychiatric:        Mood and Affect: Mood normal.        Behavior: Behavior normal.        Thought Content: Thought content normal.        Judgment: Judgment normal.    Last INR: 3.3 Last PT: 40.1  Results for orders placed or performed in visit on 01/28/24  POCT INR   Collection Time: 01/18/24 12:00 AM  Result Value Ref Range   POC INR 2.1    *Note: Due to a large number of results and/or encounters for the requested time period, some results have not been displayed. A complete set of results can be found in Results Review.      Assessment & Plan:   Problem List Items Addressed This Visit       Other   H/O mechanical aortic valve replacement - Primary   Therapeutic at 3.3. Continue current regimen. Continue to monitor. Call with any concerns.       Relevant Orders   CoaguChek XS/INR  Waived     Follow up plan: Return in about 2 weeks (around 02/14/2024).

## 2024-02-02 DIAGNOSIS — N189 Chronic kidney disease, unspecified: Secondary | ICD-10-CM | POA: Diagnosis not present

## 2024-02-02 DIAGNOSIS — I4891 Unspecified atrial fibrillation: Secondary | ICD-10-CM | POA: Diagnosis not present

## 2024-02-02 DIAGNOSIS — E1122 Type 2 diabetes mellitus with diabetic chronic kidney disease: Secondary | ICD-10-CM | POA: Diagnosis not present

## 2024-02-02 DIAGNOSIS — E785 Hyperlipidemia, unspecified: Secondary | ICD-10-CM | POA: Diagnosis not present

## 2024-02-02 DIAGNOSIS — Z950 Presence of cardiac pacemaker: Secondary | ICD-10-CM | POA: Diagnosis not present

## 2024-02-02 DIAGNOSIS — I482 Chronic atrial fibrillation, unspecified: Secondary | ICD-10-CM | POA: Diagnosis not present

## 2024-02-02 DIAGNOSIS — J449 Chronic obstructive pulmonary disease, unspecified: Secondary | ICD-10-CM | POA: Diagnosis not present

## 2024-02-02 DIAGNOSIS — Z952 Presence of prosthetic heart valve: Secondary | ICD-10-CM | POA: Diagnosis not present

## 2024-02-02 DIAGNOSIS — Z7982 Long term (current) use of aspirin: Secondary | ICD-10-CM | POA: Diagnosis not present

## 2024-02-02 DIAGNOSIS — D6869 Other thrombophilia: Secondary | ICD-10-CM | POA: Diagnosis not present

## 2024-02-02 DIAGNOSIS — Z95811 Presence of heart assist device: Secondary | ICD-10-CM | POA: Diagnosis not present

## 2024-02-02 DIAGNOSIS — I428 Other cardiomyopathies: Secondary | ICD-10-CM | POA: Diagnosis not present

## 2024-02-02 DIAGNOSIS — Z95810 Presence of automatic (implantable) cardiac defibrillator: Secondary | ICD-10-CM | POA: Diagnosis not present

## 2024-02-02 DIAGNOSIS — I255 Ischemic cardiomyopathy: Secondary | ICD-10-CM | POA: Diagnosis not present

## 2024-02-02 DIAGNOSIS — I130 Hypertensive heart and chronic kidney disease with heart failure and stage 1 through stage 4 chronic kidney disease, or unspecified chronic kidney disease: Secondary | ICD-10-CM | POA: Diagnosis not present

## 2024-02-02 DIAGNOSIS — I5020 Unspecified systolic (congestive) heart failure: Secondary | ICD-10-CM | POA: Diagnosis not present

## 2024-02-02 DIAGNOSIS — N1831 Chronic kidney disease, stage 3a: Secondary | ICD-10-CM | POA: Diagnosis not present

## 2024-02-02 DIAGNOSIS — I081 Rheumatic disorders of both mitral and tricuspid valves: Secondary | ICD-10-CM | POA: Diagnosis not present

## 2024-02-02 DIAGNOSIS — D72829 Elevated white blood cell count, unspecified: Secondary | ICD-10-CM | POA: Diagnosis not present

## 2024-02-02 DIAGNOSIS — Z7901 Long term (current) use of anticoagulants: Secondary | ICD-10-CM | POA: Diagnosis not present

## 2024-02-02 DIAGNOSIS — I447 Left bundle-branch block, unspecified: Secondary | ICD-10-CM | POA: Diagnosis not present

## 2024-02-02 DIAGNOSIS — I5022 Chronic systolic (congestive) heart failure: Secondary | ICD-10-CM | POA: Diagnosis not present

## 2024-02-02 DIAGNOSIS — Z8673 Personal history of transient ischemic attack (TIA), and cerebral infarction without residual deficits: Secondary | ICD-10-CM | POA: Diagnosis not present

## 2024-02-02 DIAGNOSIS — J811 Chronic pulmonary edema: Secondary | ICD-10-CM | POA: Diagnosis not present

## 2024-02-02 DIAGNOSIS — R000 Tachycardia, unspecified: Secondary | ICD-10-CM | POA: Diagnosis not present

## 2024-02-02 DIAGNOSIS — I517 Cardiomegaly: Secondary | ICD-10-CM | POA: Diagnosis not present

## 2024-02-02 DIAGNOSIS — I2510 Atherosclerotic heart disease of native coronary artery without angina pectoris: Secondary | ICD-10-CM | POA: Diagnosis not present

## 2024-02-02 DIAGNOSIS — I959 Hypotension, unspecified: Secondary | ICD-10-CM | POA: Diagnosis not present

## 2024-02-02 DIAGNOSIS — F17210 Nicotine dependence, cigarettes, uncomplicated: Secondary | ICD-10-CM | POA: Diagnosis not present

## 2024-02-02 DIAGNOSIS — R079 Chest pain, unspecified: Secondary | ICD-10-CM | POA: Diagnosis not present

## 2024-02-02 DIAGNOSIS — I472 Ventricular tachycardia, unspecified: Secondary | ICD-10-CM | POA: Diagnosis not present

## 2024-02-02 DIAGNOSIS — N179 Acute kidney failure, unspecified: Secondary | ICD-10-CM | POA: Diagnosis not present

## 2024-02-02 DIAGNOSIS — M109 Gout, unspecified: Secondary | ICD-10-CM | POA: Diagnosis not present

## 2024-02-02 DIAGNOSIS — G4733 Obstructive sleep apnea (adult) (pediatric): Secondary | ICD-10-CM | POA: Diagnosis not present

## 2024-02-02 DIAGNOSIS — Z7984 Long term (current) use of oral hypoglycemic drugs: Secondary | ICD-10-CM | POA: Diagnosis not present

## 2024-02-02 DIAGNOSIS — I4819 Other persistent atrial fibrillation: Secondary | ICD-10-CM | POA: Diagnosis not present

## 2024-02-04 ENCOUNTER — Telehealth: Payer: Self-pay | Admitting: Internal Medicine

## 2024-02-04 ENCOUNTER — Encounter: Payer: Self-pay | Admitting: Internal Medicine

## 2024-02-04 ENCOUNTER — Telehealth: Payer: Self-pay | Admitting: Physician Assistant

## 2024-02-04 NOTE — Telephone Encounter (Signed)
 As provider on call, I received a call at 7:20 this morning notifying me of an INR 5.9. Patient was also airlifted and is admitted to [facility] for hypotension and v-tach.

## 2024-02-04 NOTE — Telephone Encounter (Signed)
 error

## 2024-02-04 NOTE — Telephone Encounter (Signed)
 Pt's friend [name] is calling regarding pt. Please advise.

## 2024-02-04 NOTE — Telephone Encounter (Signed)
 Roberto [name] is returning phone calling.

## 2024-02-04 NOTE — Telephone Encounter (Signed)
 Left message for patient to call back

## 2024-02-04 NOTE — Telephone Encounter (Signed)
Left message for [name] to call back.  

## 2024-02-04 NOTE — Telephone Encounter (Signed)
 [name] calling to let us  know pt was life flighted to [facility] and wants to speak to nurse. She would like to talk to someone asap.

## 2024-02-04 NOTE — Telephone Encounter (Signed)
 Left message for pt to call.

## 2024-02-05 ENCOUNTER — Encounter: Payer: Self-pay | Admitting: Internal Medicine

## 2024-02-05 ENCOUNTER — Ambulatory Visit: Admitting: Cardiology

## 2024-02-05 NOTE — Consults (Signed)
 Inpatient Tobacco Cessation Counseling Note  This medical encounter was conducted virtually using [email or web address]  TeleHealth protocols.  I have identified myself to the patient and conveyed my credentials to Mr. Housey I have explained the capabilities and limitations of telemedicine and the patient/proxy and myself both agree that it is appropriate for their current circumstances/symptoms.   Contact Information Person Contacted: Elsie Span        [employer] number: [phone]     Phone Outcome: spoke with pt Is there someone else in the room? No.   Patient's location at the time of the telephone visit: Hospitalized at [facility]  Provider's location at the time of the telephone visit: At home, in [place]     Purpose of contact:    Pt participated in a telephone visit for tobacco cessation counseling.  Patient was admitted to hospital for Wide-complex tachycardia [R00.0]. Patient consented to telephone visit given due to social isolation measures in place due to the COVID-19 pandemic.   Tobacco Use History and Assessment Time Since Last Tobacco Use: 1 to 7 days ago Tobacco Withdrawal (Past 24 Hours): None noted Type of Tobacco Products Used: Cigarettes Quantity Used: 5 Quantity Per: day Longest Time Without Tobacco: Years # of Hours, Days, Weeks, Months or Years: 3 Most Recent Attempt: Past year Medications Used in Past Attempts: Nicotine  Patch Side Effects: Skin irritation  Behavioral Assessment Why Uses: 1. habit 2. stress-relief Reasons to Become Tobacco Free: health Barriers/Challenges: 1. long-standing habit 2. stress Strategies: pt can use 1. NRT to manage cravings 2. hand-to-mouth replacements  NOTE: Pt denies having cravings to smoke and states he smokes 5cpd. Pt reviewed he had been quit for 3 years and just started back smoking about 6 months ago. Pt reports he felt so poorly at the end of the day that he decided to quit. Pt reports at that time he was  also smoking 60cpd. When he started back 6 months ago, he was smoking 20cpd but pt states in the last few weeks he has reduced to 5cpd. Pt states he is happy with the amount he smokes now and doesn't feel highly motivated to further reduce or quit. SW reviewed recommendation for pt to become tobacco-free in context of heart and lung health. Pt has persistent afib and heart failure, and COPD. SW explained toxic effects of smoking and supports available to pt to help him quit. Pt states he tried a nicotine  patch in the past and it irritates his skin, giving him a burning sensation. Pt has never tried nicotine  gum/lozenge and is open to them. SW reviewed chew and park process associated with the gum and that pt can get gum/lozenge outside of the hospital covered by his Medicaid with a prescription. Pt agrees to prescriptions for gum/lozenge to go with him at discharge. SW and pt discussed hand-to-mouth replacements. SW provided pt with contact information, physical improvements related to tobacco cessation, and available resources (including outpatient Tobacco Treatment Program at [facility] and [place] [facility]).  Treatment Plan Please see below for medication recommendations in bold.  Cessation Meds Currently Using: None Medications Recommended During Hospitalization: Gum 4mg , Lozenge 4mg  Outpatient/Discharge Medications Recommended: Gum 4mg , Lozenge 4mg  Plan to Obtain Outpatient Meds: TTS messaged providers for Rx Patient's Plan Post Discharge/Visit: Undecided  As part of this Telephone Visit, no in-person exam was conducted.   I personally spent 14 minutes counseling the patient via telephone about tobacco cessation.  I spent an additional 10 minutes on pre- and post-visit activities.  The patient was physically located in [place]  or a state in which I am permitted to provide care. The patient and/or parent/guardian understood that s/he may incur co-pays and cost sharing, and agreed to  the telemedicine visit. The visit was reasonable and appropriate under the circumstances given the patient's presentation at the time.   The patient and/or parent/guardian has been advised of the potential risks and limitations of this mode of treatment (including, but not limited to, the absence of in-person examination) and has agreed to be treated using telemedicine. The patient's/patient's family's questions regarding telemedicine have been answered.    If the visit was completed in an ambulatory setting, the patient and/or parent/guardian has also been advised to contact their provider's office for worsening conditions, and seek emergency medical treatment and/or call 911 if the patient deems either necessary.   Visit Format/Coding: Telephone   Coding: 01032 (11-20 minutes) Service rendered over the phone most consistent with: Tobacco cessation counseling, greater than 10 minutes (00592)    Mliss Fair, LCSW, NCTTP Clinical Social Worker / Tobacco Treatment Specialist [facility] Phone: [phone]

## 2024-02-05 NOTE — Consults (Signed)
 ------------------------------------------------------------------------------- Attestation signed by Mazzella, Anthony James, MD at 02/05/24 1524 I saw and evaluated the patient, participating in the key portions of the service including testing review and interpretation.  I reviewed the Fellow's note. I agree with findings and plan. Reviewed plan for upgrade to CRT-D tomorrow.  Curtistine JINNY Lights, MD  -------------------------------------------------------------------------------    DIVISION OF CARDIOLOGY [facility] , [place]       Date of Service: 02/05/24  ELECTROPHYSIOLOGY INITIAL CONSULT  NOTE  Requesting Physician: Dale Jama Pennant, MD  Requesting Service: Cardiology [facility])  Consulting Fellow: Chiquita Andes, MD   Reason for Consultation:  This 68 y.o. male is seen at the request of Cardiology [facility]) for evaluation of VT.   Assessment & Recommendations:  Mr. Rumaldo is a 68 year old male with a PMHx of HFrEF (NICM, 25-30%), aortic stensosis s/p  mechanical AVR, nonobstructive CAD, atrial fibrillation, prior VT s/p ICD in 06/2023 , LBBB who was admitted with ventricular tachycardia and worsening heart failure now s/p ICD reprogramming (to delivery therapies if any discriminators met vs. If all met) and preparing for CRT-D upgrade.    #Ventricular Tachycardia  Patient presented with VT with rates ~150bpm, though device was not sensing. Device interrogated and determined that his device SVT-VT discrimination function was programmed to IF ALL for its predetermined discriminating factors. This was adjusted to IF ANY. After adjustment, he was successfully antitachycardia paced back to his native rhythm (aflutter with variable block and LBBB). Appears to have both a left bundle and right bundle morphology suggesting two different exit sites.      #Left bundle branch block #Heart failure with severely reduced systolic function (EF < 30%)  Patient has  known left bundle branch block with QRS ~144 msec in the setting of EF < 30%. Appears to be on maximally tolerated GDMT. Will likely benefit from CRT upgrade.  RECOMMENDATIONS:  -Agree with mexilitine 150mg  Q8 hours and amiodarone  400mg  TID. -Recommend optimizing patient from heart failure perspective, ensure on maximally tolerated GDMT.  -Ensure K > 4, Mg > 2.  -Plan for CRT-D upgrade tomorrow with Dr. Lennie. Please make NPO at [place]. Hold all heparin  products.  Goal INR <3 (please obtain AM INR).  I have recommended the primary team order pertinent cardiac tests as detailed above in the assessment and plan.     Diagnoses addressed on this consultation: Ventricular Tachycardia   I discussed the plan with the primary team via Epic chat     Thank you for involving us  in this patient's care.  We will follow-up on the results of any recommended diagnostic studies and contact you regarding any additional recommendations.  This patient was seen and discussed with Dr. Mazella who is in agreement with the above recommendations.  If any further questions arise please page the Cardiology Consult pager [phone]) Monday to Friday from 8am to 5pm or the on-call Cardiology pager [phone]) nights and weekends.    History of Present Illness: The patient is a 68 y.o. male with a past medical history significant for HFrEF (NICM, 25-30%), aortic stensosis s/p  mechanical AVR, nonobstructive CAD (LHC 2024 60% stenosis D1, 20% mid-LAD and prox RCA), ascending aortic aneurysm, COPD, prior TIA, CKD, DM, atrial fibrillation, prior VT s/p ICD in 06/2023 (on amio at home) who was admitted after being found to be tachycardiac at home, found to be in VT.   Mr. Disboro's wife was checking his blood pressure at home when she noticed he was hypertensive  and tachycardic to the 150s, prompting them to come to the ED. He denied any chest pain, shortness of breath, palpitations, lightheadedness.  ECG showed likely VT with  rate of ~150 bpm. He was started on amiodarone  and lidocaine  drips in the ED.  His ICD was interrogated over the weekend- ICD was picking up SVT discriminators and not recognizing the rhythm as VT. Parameters were changed from IF ALL to IF ANY with subsequent successful ATP.   Workup notable for hs troponin elevation 133-> 144-> 141. Electrolytes and TSH WNL.   He had leukocytosis 17 (now downtrending) and Cr 2.2 (near baseline).   Yesterday, primary team stopped the drips and started an oral amiodarone  load along with mexilitine 150mg  TID. He has been continued on his home metoprolol  tartrate 25mg  BID.   INTERVAL EVENTS: Patient has had a few runs of asymptomatic NSVT but no more sustained events/ICD therapies. TTE came back with severely reduced EF, worse from prior, of ~15%.      Cardiac Interventions / Surgery: ICD (06/2023) DCCV 08/28/2023 at [place]    Relevant cardiac Imaging: EKG: Aflutter with variable A-V block and LBBB Echo: (09/27/2023): 1. Left ventricular ejection fraction, by estimation, is 25 to 30%. Left ventricular ejection fraction by 3D volume is 28 %. The left ventricle has severely decreased function. The left ventricle demonstrates global hypokinesis. The left ventricular  internal cavity size was severely dilated. Left ventricular diastolic parameters are consistent with Grade II diastolic dysfunction (pseudonormalization). The average left ventricular global longitudinal strain is -10.5 %. The global longitudinal strain  is abnormal.   2. Right ventricular systolic function is mildly reduced. The right ventricular size is normal.   3. Left atrial size was mild to moderately dilated.   4. Right atrial size was moderately dilated.   5. The mitral valve is degenerative. Mild to moderate mitral valve regurgitation. No evidence of mitral stenosis.   6. Tricuspid valve regurgitation is mild to moderate.   7. The aortic valve has been repaired/replaced. Aortic  valve regurgitation versus perivalvular leak is trivial. Mild to moderate aortic valve stenosis with at least some component of patient-prosthesis mismatch. Aortic valve mean gradient measures 12.6  mmHg.   8. There is mild dilatation of the aortic root, measuring 43 mm. Known severe dilation of the ascending aorta is not well-visualized on this examination.   Pertinent Medical/Surgical History (reviewed in EMR): Past Medical History[1]  Pertinent Social History: Social History [2]  Pertinent Family History: Family History[3]  Home Medications: Current Outpatient Medications  Medication Instructions  . acetaminophen  (TYLENOL ) 1,000 mg, Oral, Every 6 hours PRN  . albuterol  HFA 90 mcg/actuation inhaler 2 puffs, Inhalation, Every 6 hours PRN  . allopurinol  (ZYLOPRIM ) 300 mg, Oral, Daily (standard)  . amiodarone  (PACERONE ) 200 mg, Oral, Daily (standard)  . aspirin  (ECOTRIN) 81 mg, Oral, Daily (standard)  . baclofen  (LIORESAL ) 5 mg, Oral, At bedtime  . buPROPion  (WELLBUTRIN  XL) 300 mg, Oral, Daily (standard)  . dapagliflozin  propanediol (FARXIGA ) 10 mg, Oral, Every morning  . fenofibrate  (TRICOR ) 48 mg, Oral, Daily (standard)  . fluticasone -umeclidin-vilanter (TRELEGY ELLIPTA ) 100-62.5-25 mcg inhaler 1 puff, Inhalation, Daily (standard)  . lisinopril  (PRINIVIL ,ZESTRIL ) 2.5 MG tablet TAKE ONE TABLET BY MOUTH ONCE DAILY  . losartan  (COZAAR ) 25 mg, Oral, Daily (standard)  . metFORMIN  (GLUCOPHAGE -XR) 500 mg, Oral, Daily (standard)  . nortriptyline  (PAMELOR ) 10 mg, Oral, Nightly  . nystatin  (MYCOSTATIN ) 500,000 Units, Oral, 4 times a day  . OMEGA 3-DHA-EPA-FISH OIL  ORAL 1,500 mg, Oral,  2 times a day (standard)  . OZEMPIC  1 mg, Subcutaneous, Every 7 days  . potassium chloride  20 MEQ ER tablet 20 mEq, Oral, Daily (standard)  . rosuvastatin  (CRESTOR ) 40 mg, Oral, Daily (standard)  . spironolactone  (ALDACTONE ) 25 mg, Oral, Daily (standard)  . torsemide  (DEMADEX ) 40 mg, Oral, Daily (standard)   . warfarin (JANTOVEN ) 3.5 mg, Oral, Daily    Current Medications: Scheduled Meds:Scheduled Medications[4] Continuous Infusions:Infusions Meds[5] PRN Meds:.PRN Medications[6]  Review of Systems: Review of ten systems is negative or unremarkable except as stated above.  Physical Exam: VITAL SIGNS: BP 90/65   Pulse 80   Temp 36.7 C (98 F) (Oral)   Resp 25   Ht 167.6 cm (5' 6)   Wt (!) 113.9 kg (251 lb 1.7 oz)   SpO2 95%   BMI 40.53 kg/m   General: Alert, no distress HEENT: Overall benign Cardiac: normal rate, irregularly irregular rhythm  Pulmonary: no increased work of breathing Abdomen: Soft, non-tender Extremities: No LE edema. Legs are warm Neuro: Alert and oriented. No focal deficits   I have reviewed Pertinent Notes from the Cardiology service, with patient evaluation including ECGs/telemetry, Pertinent Labs, including CMP, CBC ,hstroponin, TSH, and Echocardiogram(s)    I have independently interpreted Most recent ECG, notable for atrial flutter with variable A-V block.   Other Pertinent Test Results: Lab Results  Component Value Date   TROPONINI 141 [facility]) 02/03/2024   TROPONINI 144 (HH) 02/02/2024   TROPONINI 131 (HH) 02/02/2024   Lab Results  Component Value Date   CREATININE 1.30 (H) 02/05/2024   Lab Results  Component Value Date   PLT 218 02/05/2024   Lab Results  Component Value Date   INR 4.52 02/05/2024   INR 7.38 02/04/2024   INR 7.55 02/03/2024      Chiquita Andes, MD Cardiology Fellow  02/05/24         [1] Past Medical History: Diagnosis Date  . Benign essential hypertension   . Cardiomyopathy      . H/O aortic valve replacement 2011  . Hyperlipidemia   [2] Social History Socioeconomic History  . Marital status: Single  Tobacco Use  . Smoking status: Every Day    Current packs/day: 0.25    Types: Cigarettes  . Smokeless tobacco: Never  . Tobacco comments:    5cpd   Social Drivers of Health   Financial  Resource Strain: Low Risk  (02/05/2024)   Overall Financial Resource Strain (CARDIA)   . Difficulty of Paying Living Expenses: Not very hard  Recent Concern: Financial Resource Strain - High Risk (12/19/2023)   Received from [facility]   Overall Financial Resource Strain (CARDIA)   . How hard is it for you to pay for the very basics like food, housing, medical care, and heating?: Hard  Food Insecurity: No Food Insecurity (02/05/2024)   Hunger Vital Sign   . Worried About Programme researcher, broadcasting/film/video in the Last Year: Never true   . Ran Out of Food in the Last Year: Never true  Recent Concern: Food Insecurity - Food Insecurity Present (12/19/2023)   Received from [facility]   Hunger Vital Sign   . Within the past 12 months, you worried that your food would run out before you got the money to buy more.: Sometimes true   . Within the past 12 months, the food you bought just didn't last and you didn't have money to get more.: Sometimes true  Transportation Needs: No Transportation Needs (12/19/2023)   Received  from [facility] - Transportation   . In the past 12 months, has lack of transportation kept you from medical appointments or from getting medications?: No   . In the past 12 months, has lack of transportation kept you from meetings, work, or from getting things needed for daily living?: No  Physical Activity: Insufficiently Active (12/19/2023)   Received from [facility]   Exercise Vital Sign   . On average, how many days per week do you engage in moderate to strenuous exercise (like a brisk walk)?: 1 day   . On average, how many minutes do you engage in exercise at this level?: 10 min  Stress: Stress Concern Present (12/19/2023)   Received from [facility] - Occupational Stress Questionnaire   . Do you feel stress - tense, restless, nervous, or anxious, or unable to sleep at night because your mind is troubled all the time - these days?: Very much   Social Connections: Socially Isolated (12/19/2023)   Received from [facility]   Social Connection and Isolation Panel   . In a typical week, how many times do you talk on the phone with family, friends, or neighbors?: Never   . How often do you get together with friends or relatives?: Never   . How often do you attend church or religious services?: Never   . Do you belong to any clubs or organizations such as church groups, unions, fraternal or athletic groups, or school groups?: No   . Are you married, widowed, divorced, separated, never married, or living with a partner?: Divorced  Housing: Low Risk  (02/05/2024)   Housing   . Within the past 12 months, have you ever stayed: outside, in a car, in a tent, in an overnight shelter, or temporarily in someone else's home (i.e. couch-surfing)?: No   . Are you worried about losing your housing?: No  [3] Family History Problem Relation Age of Onset  . Hypertension Mother   . Hyperlipidemia Mother   . Heart disease Father   . Diabetes Father   . Hypertension Father   . Hyperlipidemia Father   . Hyperlipidemia Sister   . Heart disease Brother   . Hypertension Brother   . Hyperlipidemia Brother   [4] . allopurinol   300 mg Oral Daily  . amiodarone   400 mg Oral 3xd Meals  . buPROPion   300 mg Oral Daily  . empagliflozin  25 mg Oral Daily  . fluticasone  furoate-vilanterol  1 puff Inhalation Daily (RT)   And  . umeclidinium  1 puff Inhalation Daily (RT)  . insulin  lispro  0-20 Units Subcutaneous ACHS  . metoPROLOL  tartrate  25 mg Oral BID  . mexiletine  150 mg Oral Q8H SCH  . phytonadione (AQUA-MEPHYTON) intravenous  1 mg Intravenous Once  . pravastatin  80 mg Oral Nightly  . Warfarin - Pharmacy Dosing by INR   Other Pharmacy dosing  [5] [6] acetaminophen , albuterol , dextrose  in water, glucagon, glucose, melatonin

## 2024-02-05 NOTE — Telephone Encounter (Signed)
 Addressed in telephone encounter from 02/05/24.

## 2024-02-05 NOTE — Telephone Encounter (Signed)
 Consulted with Dorise Brittle, [employer] Rep. who advised the following below.   The Discriminators are on ALL. Onset and Stability are classifying it as ST and Morphology is saying VT. All three have to say VT in order to give therapy as that is how the device is currently programmed. It's functioning normally but needs to be tailored differently for this patient. In order to treat the rhythm, I would turn off Onset and Stability and only use Morphology.   Routing to Damien Maid, Environmental education officer.

## 2024-02-05 NOTE — Telephone Encounter (Signed)
 Spoke with [name] (DPR). Roberto [name] expressed frustration about her experience trying to get through to a nurse on 02/04/24. She states that her   Atrial lead

## 2024-02-05 NOTE — Consults (Signed)
 [facility]  Care Management   Patient is a 68 y.o. admitted on 02/02/2024 for Wide-complex tachycardia [R00.0]. Per review of the medical record and discussion with the treating team, the patient does not meet indicators for a full assessment at this time. CM will continue to assess for discharge needs and follow up, as indicated.  Whitman Polite, RN February 05, 2024 12:17 PM   Food Insecurity: No Food Insecurity (02/05/2024)   Hunger Vital Sign   . Worried About Programme researcher, broadcasting/film/video in the Last Year: Never true   . Ran Out of Food in the Last Year: Never true  Recent Concern: Food Insecurity - Food Insecurity Present (12/19/2023)   Received from [facility]   Hunger Vital Sign   . Within the past 12 months, you worried that your food would run out before you got the money to buy more.: Sometimes true   . Within the past 12 months, the food you bought just didn't last and you didn't have money to get more.: Sometimes true    Financial Resource Strain: Low Risk  (02/05/2024)   Overall Financial Resource Strain (CARDIA)   . Difficulty of Paying Living Expenses: Not very hard  Recent Concern: Financial Resource Strain - High Risk (12/19/2023)   Received from [facility]   Overall Financial Resource Strain (CARDIA)   . How hard is it for you to pay for the very basics like food, housing, medical care, and heating?: Hard   Housing: Low Risk  (02/05/2024)   Transportation Needs: No Transportation Needs (12/19/2023)   Received from [facility] - Transportation   . In the past 12 months, has lack of transportation kept you from medical appointments or from getting medications?: No   . In the past 12 months, has lack of transportation kept you from meetings, work, or from getting things needed for daily living?: No

## 2024-02-05 NOTE — Telephone Encounter (Signed)
 Consulted with Dr Inocencio who reviewed pt's notes from [facility].  There is not currently an EP note available for him to comment on.  RN will make Dr Waddell aware of pt's hospitalization and pending surgery when he is in clinic tomorrow 8/6.  Clinical supervisor has been made aware of pt's DPR concerns.

## 2024-02-05 NOTE — Telephone Encounter (Signed)
 Spoke with [name], DPR who reports pt was sent to [facility] via Estate manager/land agent from [facility].  Pt's DPR states pt was in VT and did not receive shock from ICD   She states she tried to reach a nurse yesterday 02/04/24 to notify Dr Waddell but never received a callback.  She states she was on hold for 35 minutes before giving information to a phone representative and when she was called back the incorrect number was called.  She states it does not seem that [place] does not seem to care about the patient and people who answer the phones certainly don't care. RN apologized and offered reassurance that [facility] does care and is very concerned about our patients. DPR states pt is now scheduled for surgery tomorrow to have a second wire inserted.  The patient is scared and would like confirmation that he is doing the right thing.  Advised Dr Waddell is out of the office today but RN would like to review notes from [facility] along with our device clinic and Dr Adrian colleagues who are in the office today.  Will also pass DPR's concerns along to clinical supervisor. DPR verbalizes understanding and agrees with current plan.

## 2024-02-06 ENCOUNTER — Telehealth: Payer: Self-pay

## 2024-02-06 NOTE — Telephone Encounter (Signed)
 Copied from CRM #[id number]. Topic: General - Other >> Feb 06, 2024 11:18 AM Vena H wrote: Reason for CRM: Pt's caregiver [name] called in stating she is wanting to speak with a nurse about the pt and it is urgent

## 2024-02-06 NOTE — Consults (Signed)
 Warfarin Therapeutic Monitoring Pharmacy Note  Roberto [name] is a 68 y.o. male continuing warfarin.   Indication: mechanical aortic valve replacement, atrial fibrillation  Prior Dosing Information: Home regimen: warfarin 3.5 mg daily      Source(s) of information used to determine prior to admission dosing: Clinic note [facility] 7/31) + dispense history  Goals: Therapeutic Drug Levels INR range: 2.5-3.5  Additional Clinical Monitoring/Outcomes Monitor hemoglobin and platelets Monitor for signs and symptoms of bleeding Monitor liver function (LFTs, bilirubin)  Results: Lab Results  Component Value Date   INR 1.48 02/06/2024   INR 4.52 02/05/2024   INR 7.38 02/04/2024    Pharmacokinetic Considerations and Significant Drug Interactions:  Drug Interactions see below table  Bridge Therapy None required  Concurrent Antiplatelet Medications not applicable  Assessment/Plan: Recommendation(s) INR is subtherapeutic. Change current regimen to warfarin 2.5 mg once daily (total weekly dose = 17.5 mg; 30% reduction from home regimen) as INR was supratherapeutic on admission.   Follow-up Next INR to be obtained: daily with AM labs   A pharmacist will continue to monitor and recommend INRs/dose changes as appropriate  Longitudinal Dose Monitoring: Date AM INR PM Dose (mg) Key Drug Interactions  02/06/2024 1.48 2.5 mg Amiodarone  400 mg PO TID, allopurinol   02/05/24 4.52 HOLD Amiodarone  400 mg PO TID, allopurinol , Vitamin K 1 mg IV  02/04/24 7.38 HOLD Amiodarone  400 mg PO TID, allopurinol   02/03/24 7.55 HOLD Amiodarone  @1  mg/min > 400 mg PO TID, allopurinol     Please page service pharmacist with questions/clarifications.  Hadassah Snell, PharmD PGY2 Cardiology Pharmacy Resident

## 2024-02-06 NOTE — Progress Notes (Signed)
 ------------------------------------------------------------------------------- Attestation signed by Hinderliter, Dale Ruth, MD at 02/06/24 2106 ATTENDING PHYSICIAN ATTESTATION  I saw and evaluated the patient with the resident and participated in the key elements of the service.  I reviewed the note and agree with the resident's findings and plan.  Plan continued antiarrhythmic therapy, treatment of heart failure, implantation of biventricular pacemaker.  [name], MD  -------------------------------------------------------------------------------  Cardiology - Team 2 [facility]) Progress Note  Assessment & Plan:  Roberto [name] is a 68 y.o. male whose presentation is complicated by VT, persistent Afib, HFrEF s/p ICD, aortic stenosis s/p mechanical AVR on Warfarin, CAD, COPD, TIA, T2DM, CKD3a that presented to [facility] Emergency Dept with tachycardia and hypertension, found to be in ventricular tachycardia 2/2 ICD mis-recognition of VT as SVT.  Principal Problem:   Sustained ventricular tachycardia    Active Problems:   H/O aortic valve replacement   Benign essential hypertension   Hyperlipidemia   Chronic HFrEF (heart failure with reduced ejection fraction)      Warfarin anticoagulation   CAD (coronary artery disease)   Aneurysm of ascending aorta   T2DM (type 2 diabetes mellitus)      Stage 3a chronic kidney disease (CMS-HCC)   Tobacco use disorder  Active Problems  #VT s/p ICD  #Persistent Afib  #Hx of TIA  #Anticoag iso mechanical AVR (2016)  ICD settings adjusted per EP. Pt last in VT 8/5 AM. Asymptomatic to runs. Often with spontaneous resolution. Max run of 26 beats this admission. Regarding AFib, prior cardioversion in 08/2023 to sinus rhythm. Remains in atrial fibrillation this admission. CRT planned for today with EP; warfarin held yesterday for INR >3.5, now <2; will restart s/p CRT  - EP following, appreciate recs - Continue amiodarone  400 mg TID  - Continue home  mexiletine - Continue metoprolol  tartrate 25 mg BID - CRT procedure today  - Restart home warfarin this evening - Tele - Daily labs (CBC, BMP, Mg)   #HFrEF (LVEF 15-20%) Echo this admission suggestive of newly-reduced EF, 15-20%, down from 25-30% (March 2025), possibly iso sustained arrhythmias. Med rec w/ wife yesterday, will consider restarting home GDMT this afternoon as tolerated - Hold home Farxiga ; continue Jardiance 25 daily  - Continue metoprolol  tartrate 25 mg BID  #AKI on CKD Stable. Cr 1.59 today. Follows with [facility]. Baseline GFR ~49, Cr 1.4-1.5, though more recently has been up to 2 in early July.  - Continue holding Torsemide ; continue to evaluate volume exam - Daily BMP, Mag  #COPD  OSA Has a CPAP at home that is old, no longer uses. Diagnosed on sleep study from 2020. Chart history of COPD and is currently taking Trelegy daily with rescue Albuterol . Wheezes on exam but no shortness of breath or productive cough.  - Continue formulary equivalent of Trelegy - PRN Albuterol   Chronic Problems #Depression - Continue home wellbutrin  300 mg qd, nortriptyline  10 mg every day   #T2DM Well-controlled during this admission. Takes Farxiga , Metformin , and Ozempic  at home - Continue Jardiance 25 mg daily - Hold Farxiga , Ozempic , Metformin  - SSI   #Gout  - Continue home allopurinol   The patient's presentation is complicated by the following clinically significant conditions requiring additional evaluation and treatment: - Chronic kidney disease POA requiring further investigation, treatment, or monitoring   Daily Checklist: Diet: Regular Diet and NPO at MN DVT PPx: Patient Already on Full Anticoagulation with warfarin Electrolytes: Replete Potassium to >/=4 and Magnesium  to >/=2 Code Status: Full Code Dispo: Transfer to Floor  Team Contact Information:  Primary Team: Cardiology - Team 2 [facility]) Primary Resident: Nilsa Lav, MD Resident's Pager: [phone]  (Cardiology Team 2 Intern)  Interval History:  No acute events overnight. Had roughly 10 beats of VT overnight but has remained asymptomatic and stable.   Patient reports that he's feeling well and looking forward to his procedure this afternoon.   Objective:  Temp:  [36.7 C (98 F)-36.9 C (98.5 F)] 36.9 C (98.5 F) Pulse:  [63-81] 69 Resp:  [15-25] 22 BP: (89-120)/(65-80) 113/73 SpO2:  [94 %-100 %] 97 %  Gen: NAD, converses  HENT: atraumatic, normocephalic Heart: irregular, unchanged from previous  Lungs: CTAB, no crackles or wheezes Abdomen: soft, NTND Extremities: trace edema   [name], MS3  --------------------------------------------------------- I attest that I have reviewed the student note and that the components of the history of present illness, the physical exam, and the assessment and plan documented were performed by me or were performed in my presence by the student where I verified the documentation and performed (or re-performed) the exam and medical decision making.  Nilsa Lav, MD [facility] Internal Medicine PGY-1

## 2024-02-06 NOTE — Telephone Encounter (Signed)
 Returned call to [employer]. She just wanted to be sure PCP is aware he has been in the hospital ongoing. Advised that while PCP is not here this week that the covering providers are aware.

## 2024-02-07 NOTE — BH Treatment Plan (Signed)
 Electrophysiology Treatment Plan Note  86M with nonobstructive CAD, NICM (15-20%), Afib, VT s/p ICD 06/25/23, LBBB, aortic stenosis s/p mechanical AVR who was admitted for VT and acute decompensated heart failure, now s/p CRT-D upgrade on 8/6.  Brief Hospital Course Initially presented with VT (HR 150s) though device not sensing. He was managed with IV amiodarone  and lidocaine  earlier in his hospital stay, and he is being discharged with Toprol  XL, amiodarone , and mexiletine. Device interrogated and determined that his device SVT-VT discrimination function was programmed to IF ALL for its predetermined discriminating factors. This was adjusted to 2 out of 3. After adjustment, he was successfully antitachycardia paced back to his native rhythm (aflutter versus Afib with LBBB). In light of his worsened EF, he was upgraded to BiV device on 8/6.  He did not receive an atrial lead due to long-standing history of Afib. Note 08/2023 mentions that he has a history of Afib with RVR that has been refractory to treatment with amiodarone  and cardioversion 08/28/23. His Afib with RVR was driving inappropriate ATP/ICD therapies at that time. Electrophysiologist Dr. Cindie considered biventricular device with AV nodal ablation during this hospitalization.   During this hospitalization (8/7), device interrogation shows underlying rhythm to be coarse atrial fibrillation HR 70s. He is now programmed for VVI 75 LV -> RV 40 ms to ensure that he is being biventricularly paced > 75% of the time. He would benefit from uptitration of beta-blockade as tolerated. Defer to cardiology team at [facility] with regards to pursuing rhythm control strategy for Afib and Aflutter.  Underlying Rhythm prior to BiV Placement Afib and Aflutter with variable block     Echocardiogram 02/04/24   1. The left ventricle is moderately dilated in size with upper normal wall thickness.   2. The left ventricular systolic function is severely  decreased, LVEF is visually estimated at 15-20%.   3. There is grade III diastolic dysfunction (severely elevated filling pressure).   4. There is mild mitral valve regurgitation.   5. Aortic valve replacement (27 mm Mechanical - bileaflet, implantation date: 2016).   6. Aortic valve Doppler indices are consistent with normal prosthetic valve function.   7. The left atrium is mildly dilated in size.   8. The right ventricle is normal in size, with moderately reduced systolic function.   9. There is mild to moderate tricuspid regurgitation.   10. The right atrium is severely dilated in size.   11. IVC size and inspiratory change suggest elevated right atrial pressure. (10-20 mmHg).

## 2024-02-08 ENCOUNTER — Other Ambulatory Visit: Payer: Self-pay

## 2024-02-08 ENCOUNTER — Emergency Department (HOSPITAL_COMMUNITY)

## 2024-02-08 ENCOUNTER — Other Ambulatory Visit: Payer: Self-pay | Admitting: Physician Assistant

## 2024-02-08 ENCOUNTER — Encounter (HOSPITAL_COMMUNITY): Payer: Self-pay | Admitting: *Deleted

## 2024-02-08 ENCOUNTER — Emergency Department (HOSPITAL_COMMUNITY)
Admission: EM | Admit: 2024-02-08 | Discharge: 2024-02-08 | Disposition: A | Discharge status: Against Medical Advice | Attending: Emergency Medicine | Admitting: Emergency Medicine

## 2024-02-08 ENCOUNTER — Other Ambulatory Visit: Payer: Self-pay | Admitting: Family Medicine

## 2024-02-08 DIAGNOSIS — T82198A Other mechanical complication of other cardiac electronic device, initial encounter: Secondary | ICD-10-CM | POA: Diagnosis not present

## 2024-02-08 DIAGNOSIS — I38 Endocarditis, valve unspecified: Secondary | ICD-10-CM

## 2024-02-08 DIAGNOSIS — I484 Atypical atrial flutter: Secondary | ICD-10-CM

## 2024-02-08 DIAGNOSIS — I5042 Chronic combined systolic (congestive) and diastolic (congestive) heart failure: Secondary | ICD-10-CM

## 2024-02-08 DIAGNOSIS — Z5321 Procedure and treatment not carried out due to patient leaving prior to being seen by health care provider: Secondary | ICD-10-CM | POA: Diagnosis not present

## 2024-02-08 DIAGNOSIS — W19XXXA Unspecified fall, initial encounter: Secondary | ICD-10-CM | POA: Diagnosis not present

## 2024-02-08 DIAGNOSIS — Z95810 Presence of automatic (implantable) cardiac defibrillator: Secondary | ICD-10-CM

## 2024-02-08 DIAGNOSIS — T82198D Other mechanical complication of other cardiac electronic device, subsequent encounter: Secondary | ICD-10-CM | POA: Insufficient documentation

## 2024-02-08 DIAGNOSIS — I517 Cardiomegaly: Secondary | ICD-10-CM | POA: Diagnosis not present

## 2024-02-08 DIAGNOSIS — I2510 Atherosclerotic heart disease of native coronary artery without angina pectoris: Secondary | ICD-10-CM

## 2024-02-08 DIAGNOSIS — I771 Stricture of artery: Secondary | ICD-10-CM | POA: Diagnosis not present

## 2024-02-08 DIAGNOSIS — Z950 Presence of cardiac pacemaker: Secondary | ICD-10-CM | POA: Diagnosis not present

## 2024-02-08 DIAGNOSIS — R899 Unspecified abnormal finding in specimens from other organs, systems and tissues: Secondary | ICD-10-CM

## 2024-02-08 LAB — BASIC METABOLIC PANEL WITH GFR
Anion gap: 13 (ref 5–15)
BUN: 31 mg/dL — ABNORMAL HIGH (ref 8–23)
CO2: 20 mmol/L — ABNORMAL LOW (ref 22–32)
Calcium: 8.9 mg/dL (ref 8.9–10.3)
Chloride: 106 mmol/L (ref 98–111)
Creatinine, Ser: 2.03 mg/dL — ABNORMAL HIGH (ref 0.61–1.24)
GFR, Estimated: 35 mL/min — ABNORMAL LOW (ref >60)
Glucose, Bld: 124 mg/dL — ABNORMAL HIGH (ref 70–99)
Potassium: 3.6 mmol/L (ref 3.5–5.1)
Sodium: 139 mmol/L (ref 135–145)

## 2024-02-08 LAB — CBC
HCT: 45 % (ref 39.0–52.0)
Hemoglobin: 14.3 g/dL (ref 13.0–17.0)
MCH: 29.4 pg (ref 26.0–34.0)
MCHC: 31.8 g/dL (ref 30.0–36.0)
MCV: 92.4 fL (ref 80.0–100.0)
Platelets: 245 K/uL (ref 150–400)
RBC: 4.87 MIL/uL (ref 4.22–5.81)
RDW: 14.6 % (ref 11.5–15.5)
WBC: 11.9 K/uL — ABNORMAL HIGH (ref 4.0–10.5)
nRBC: 0 % (ref 0.0–0.2)

## 2024-02-08 LAB — TROPONIN I (HIGH SENSITIVITY): Troponin I (High Sensitivity): 44 ng/L — ABNORMAL HIGH (ref <18)

## 2024-02-08 NOTE — ED Triage Notes (Signed)
 Pt had a few falls yesterday and they believe they the pacemaker wires have become dislodged because they are not able to get a reading. They contacted the doctor and advised to come to ED for evaluation. [facility].

## 2024-02-08 NOTE — Telephone Encounter (Signed)
 Spoke to [employer] who reports she called [facility] and unable to connect app with staff. I offered device clinic apt today to assess LV lead. [name] stated she lives in [place] and would not be able to get him here before we close.   Considering patient had fall X 2 within a few days of LV lead placed. [name] reports during fall patients arms both went above his head and bruising noted on back.   Advised patient to go to [facility] ED for evaluation for LV lead placement d/t unable to get remote transmission, patient unable to come into device clinic prior to office closing and patients recent admission to [facility] for VT.   [name] is agreeable to plan and was appreciative of call.

## 2024-02-08 NOTE — ED Notes (Signed)
 Pt tired of waiting in triage   they did not want to wait for their discharge papers they were told by the cardiologist what to do according to the wife and they left.

## 2024-02-08 NOTE — Consult Note (Addendum)
 Cardiology Consultation   Patient ID: Roberto [name] MRN: [id number]; DOB: 06-Dec-1955  Admit date: 02/08/2024 Date of Consult: 02/08/2024  PCP:  [name], DO   [facility] Providers Cardiologist:  [name], MD  Electrophysiologist:  Danelle Birmingham, MD  Sleep Medicine:  Wilbert Bihari, MD       Patient Profile: Roberto [name] is a 68 y.o. male with a history of mild to moderate nonobstructive disease on cardiac catheterization in 06/2023, chronic HFrEF with a EF of 25-30% on last Echo in 09/2023,  VT s/p ICD in 06/2023,  persistent atrial fibrillation/flutter on Coumadin , bicuspid aortic valve s/p mechanical AVR in 03/2010, renal infarct in 2017, TIA in 2015,  COPD, hypertension, hyperlipidemia, and CKD stage IIIb who is being seen 02/08/2024 due to concern for lead dislodgment after fall and recent CRT-D placement at the request of Dr. Towana.  History of Present Illness: Roberto [name] is a 68 year old male with the above history.  Patient was admitted in 12/22 for for monomorphic VT.  He was loaded with amiodarone  and converted to atrial fibrillation.  LHC at that time showed mild to moderate nonobstructive disease.  Echo showed LVEF of 25-30%.  He underwent placement of ICD at that time.  He was rehospitalized in 08/2023 for slow VT in the 130s and zones were adjusted.  He was admitted later that month for inappropriate therapies thought to be due to dual tachycardia.  He was cardioverted during that admission to sinus rhythm/junctional rhythm.  He was recently admitted at an outside facility.  He initially presented to the [facility] ED on 02/02/2024 further evaluation of tachycardia and hypertension and concern for syncope  (wife states he was nodding off) and was noted to be in VT.  On device interrogation, it appeared his device was missed reading his VT as SVT thus he was not ATPd or shocked out of the rhythm.  He was started on IV amiodarone  and lidocaine  and was then  able to be ATPd out of VT.  He was ultimately life flight to [facility] main campus.  Echo with LVEF of 15-20%, severely reduced RV function, low-flow low gradient aortic stenosis of prosthetic valve with mean gradient of 10 mmHg, mild mitral regurgitation, and moderate to severe tricuspid regurgitation.  He underwent device upgrade to CRT with placement of a coronary sinus lead on 02/06/2024.  It sounds like they were going to try to place an atrial lead but wife states it would not fit admitted patient did not want to wait in the hospital for them to reattempt this.  Amiodarone  was increased to 400 mg 3 times daily and he was instructed to continue this dose for 5 days and then decrease to 40 mg once daily.  He was continued on mexiletine.  He was discharged on 02/07/2024.  Since discharge yesterday, he has had 2 falls.  He is unclear about what caused his falls.  He thinks he tripped over a rug but his wife states this was not the case.  He denies having any palpitations or lightheadedness/dizziness.  He did not hit his head or have any loss of consciousness.  No chest pain, notes of breath, new or worsening orthopnea, PND, or edema.  Wife notified our office of the falls and we were unable to get it remote transmission..  Therefore, he was advised to go to the ED due to concern for possible lead dislodgment.  Upon arrival to the ED, EKG showed V paced with underlying atrial flutter  with rate of 75 bpm. Device rep met us  in the ED and interrogated the device.  RV and LV leads were functioning normally.  No evidence of dislodgment. Mode was set at VVT at base rate of 75 bpm. This was changed to VVI at 80 bpm after discussion with Dr. Francyne.   Past Medical History:  Diagnosis Date   Aneurysm (HCC)    Bicuspid aortic valve    a. s/p #27 [employer] mechanical valve on 03/25/2010; b. on Coumadin ; c. TTE 12/17: EF 40-45%, moderately dilated LV with moderate LVH, AVR well-seated with 14 mmHg gradient, peak AV  velocity 2.5 m/s, mild mitral valve thickening with mild MR, mildly dilated RV with mildly reduced contraction   Cellulitis    CHF (congestive heart failure) (HCC)    Chronic kidney disease    Chronic systolic CHF (congestive heart failure) (HCC)    a. R/LHC 03/2010 showed no significant CAD, LVEDP 31 mmHg, mean AoV gradient 34 mmHg at rest and 47 mmHg with dobutamine 20 mcg/kg/min, AVA 1.0 cm^2, RA 31, RV 68/25, PA 68/47, PCWP 38. PA sat 65%. CO 6.2 L/min (Fick) and 5.3 L/min (thermodilution)   Clotting disorder (HCC)    COPD (chronic obstructive pulmonary disease) (HCC)    H/O mechanical aortic valve replacement 03/25/2010   a. #27 Carbomedics mechanical valve   Hearing loss    High cholesterol    HTN (hypertension)    Hypercholesterolemia    Renal infarct [facility]) 2017   Multiple right renal infarcts, likely embolic.   Stage 3 chronic kidney disease (HCC)    Stroke [facility])    TIA (transient ischemic attack) 05/2014    Past Surgical History:  Procedure Laterality Date   AORTIC VALVE REPLACEMENT     AORTIC VALVE SURGERY     CARDIAC CATHETERIZATION  03/21/2010   No significant CAD. Severe aortic stenosis. Severely elevated left and right heart filling pressures.   CARDIAC SURGERY  2009   CHF   CARDIOVERSION N/A 08/28/2023   Procedure: CARDIOVERSION;  Surgeon: [name] Slain, MD;  Location: [facility] INVASIVE CV LAB;  Service: Cardiovascular;  Laterality: N/A;   CARPAL TUNNEL RELEASE Left 2005   COLONOSCOPY N/A 12/13/2023   Procedure: COLONOSCOPY;  Surgeon: Suzann Inocente HERO, MD;  Location: [facility] ENDOSCOPY;  Service: Gastroenterology;  Laterality: N/A;   ICD IMPLANT N/A 06/25/2023   Procedure: ICD IMPLANT;  Surgeon: Waddell Danelle ORN, MD;  Location: [facility] INVASIVE CV LAB;  Service: Cardiovascular;  Laterality: N/A;   PACEMAKER IMPLANT     POLYPECTOMY  12/13/2023   Procedure: POLYPECTOMY, INTESTINE;  Surgeon: Suzann Inocente HERO, MD;  Location: [facility] ENDOSCOPY;  Service: Gastroenterology;;   RIGHT  HEART CATH AND CORONARY ANGIOGRAPHY N/A 01/28/2019   Procedure: RIGHT HEART CATH AND CORONARY ANGIOGRAPHY;  Surgeon: [name], MD;  Location: [facility] INVASIVE CV LAB;  Service: Cardiovascular;  Laterality: N/A;   RIGHT HEART CATH AND CORONARY ANGIOGRAPHY N/A 06/20/2023   Procedure: RIGHT HEART CATH AND CORONARY ANGIOGRAPHY;  Surgeon: [name], MD;  Location: [facility] INVASIVE CV LAB;  Service: Cardiovascular;  Laterality: N/A;   TONSILLECTOMY  1962     Home Medications:  Prior to Admission medications   Medication Sig Start Date End Date Taking? Authorizing Provider  acetaminophen  (TYLENOL ) 500 MG tablet Take 1,000 mg by mouth every 6 (six) hours as needed for moderate pain (pain score 4-6). Patient not taking: Reported on 02/08/2024    [provider]  albuterol  (PROVENTIL ) (2.5 MG/3ML) 0.083% nebulizer solution USE 1 VIAL IN  NEBULIZER EVERY 6 HOURS AS NEEDED FOR WHEEZING FOR SHORTNESS OF BREATH 09/25/23   Vicci Bouchard P, DO  allopurinol  (ZYLOPRIM ) 300 MG tablet Take 1 tablet by mouth once daily 12/03/23   Vicci, [name], DO  amiodarone  (PACERONE ) 400 MG tablet Take 1 tablet (400 mg total) by mouth Three (3) times a day with a meal for 5 days, THEN 1 tablet (400 mg total) daily. 02/07/24 05/12/24  [provider]  baclofen  (LIORESAL ) 10 MG tablet Take 0.5-1 tablets (5-10 mg total) by mouth at bedtime as needed. 01/23/24   Vicci Bouchard P, DO  buPROPion  (WELLBUTRIN  XL) 300 MG 24 hr tablet Take 300 mg by mouth daily. 08/16/23   [provider]  dapagliflozin  propanediol (FARXIGA ) 10 MG TABS tablet Take 1 tablet (10 mg total) by mouth daily before breakfast. 07/05/23   Donette Ellouise LABOR, FNP  metFORMIN  (GLUCOPHAGE -XR) 500 MG 24 hr tablet Take 1 tablet by mouth once daily with breakfast 12/20/23   Vicci Bouchard P, DO  metoprolol  succinate (TOPROL -XL) 100 MG 24 hr tablet Take 200 mg by mouth daily. 02/08/24   [provider]  mexiletine (MEXITIL) 150 MG capsule Take  150 mg by mouth 3 (three) times daily. 02/07/24   [provider]  nortriptyline  (PAMELOR ) 10 MG capsule Take 1 capsule by mouth at bedtime 01/18/24   Vicci Bouchard P, DO  nystatin  (MYCOSTATIN ) 100000 UNIT/ML suspension 5mL swish and spit TID Patient not taking: Reported on 02/08/2024 11/15/23   Vicci Bouchard P, DO  Omega-3 Fatty Acids (OMEGA 3 PO) Take 1,500 mg by mouth 2 (two) times daily.    [provider]  predniSONE  (DELTASONE ) 10 MG tablet Take 6 tabs in the AM with food day 1 and 2, 5 tabs days 3 and 4, decrease by 1 every other day until gone. Patient not taking: Reported on 02/08/2024 01/23/24   Vicci Bouchard P, DO  rosuvastatin  (CRESTOR ) 40 MG tablet Take 1 tablet (40 mg total) by mouth daily. 04/06/23   End, [name], MD  sacubitril-valsartan (ENTRESTO) 24-26 MG Take 1 tablet by mouth 2 (two) times daily. 02/07/24   [provider]  Semaglutide , 1 MG/DOSE, (OZEMPIC , 1 MG/DOSE,) 4 MG/3ML SOPN INJECT 1 MG UNDER THE SKIN ONCE WEEKLY 01/23/24   Vicci Bouchard P, DO  Spacer/Aero-Holding [facility] Use as directed Dx: COPD, J44.9 07/13/23   Vicci Bouchard P, DO  spironolactone  (ALDACTONE ) 25 MG tablet Take 1 tablet (25 mg total) by mouth daily. 11/23/23   Donette Ellouise LABOR, FNP  torsemide  (DEMADEX ) 20 MG tablet Take 1 tablet (20 mg total) by mouth 2 (two) times daily. 01/25/24   End, [name], MD  TRELEGY ELLIPTA  100-62.5-25 MCG/ACT AEPB INHALE 1 PUFF ONCE DAILY 11/29/23   [name], [name], DO  VENTOLIN  HFA 108 (90 Base) MCG/ACT inhaler INHALE 2 PUFFS BY MOUTH EVERY 6 HOURS AS NEEDED FOR WHEEZING OR SHORTNESS OF BREATH 05/16/23   [name], [name], DO  warfarin (COUMADIN ) 1 MG tablet Take 1 tablet (1 mg total) by mouth daily. Patient taking differently: Take 1 mg by mouth See admin instructions. 4.5 mg by mouth daily 11/30/23   [name], [name], DO  warfarin (COUMADIN ) 3 MG tablet Take 1 tablet (3 mg total) by mouth daily. 12/26/23   Herold Hadassah SQUIBB, MD    Scheduled Meds:   albuterol   2.5 mg Nebulization Once   Continuous Infusions:  PRN Meds:   Allergies:    Allergies  Allergen Reactions   Nicotine   Patches caused localized rash and hives     Social History:   Social History   Socioeconomic History   Marital status: Divorced    Spouse name: Not on file   Number of children: 1   Years of education: 14   Highest education level: Associate degree: occupational, Scientist, product/process development, or vocational program  Occupational History   Occupation: Disabled    Employer: UNEMPLOYED  Tobacco Use   Smoking status: Every Day    Current packs/day: 0.20    Average packs/day: 0.5 packs/day for 48.1 years (23.7 ttl pk-yrs)    Types: Cigarettes    Start date: 1974    Last attempt to quit: 2021   Smokeless tobacco: Never   Tobacco comments:    3 or less cigarettes per day  Vaping Use   Vaping status: Never Used  Substance and Sexual Activity   Alcohol use: Never   Drug use: Never   Sexual activity: Not Currently  Other Topics Concern   Not on file  Social History Narrative   Not on file   Social Drivers of Health   Financial Resource Strain: Low Risk  (02/05/2024)   Received from [facility]   Overall Financial Resource Strain (CARDIA)    How hard is it for you to pay for the very basics like food, housing, medical care, and heating?: Not very hard  Recent Concern: Financial Resource Strain - High Risk (12/19/2023)   Overall Financial Resource Strain (CARDIA)    Difficulty of Paying Living Expenses: Hard  Food Insecurity: No Food Insecurity (02/05/2024)   Received from [facility]   Hunger Vital Sign    Within the past 12 months, you worried that your food would run out before you got the money to buy more.: Never true    Within the past 12 months, the food you bought just didn't last and you didn't have money to get more.: Never true  Recent Concern: Food Insecurity - Food Insecurity Present (12/19/2023)   Hunger Vital Sign    Worried About Running  Out of Food in the Last Year: Sometimes true    Ran Out of Food in the Last Year: Sometimes true  Transportation Needs: No Transportation Needs (12/19/2023)   PRAPARE - Administrator, Civil Service (Medical): No    Lack of Transportation (Non-Medical): No  Physical Activity: Insufficiently Active (12/19/2023)   Exercise Vital Sign    Days of Exercise per Week: 1 day    Minutes of Exercise per Session: 10 min  Stress: Stress Concern Present (12/19/2023)   [employer] of Occupational Health - Occupational Stress Questionnaire    Feeling of Stress: Very much  Social Connections: Socially Isolated (12/19/2023)   Social Connection and Isolation Panel    Frequency of Communication with Friends and Family: Never    Frequency of Social Gatherings with Friends and Family: Never    Attends Religious Services: Never    Database administrator or Organizations: No    Attends Engineer, structural: Not on file    Marital Status: Divorced  Intimate Partner Violence: Not At Risk (10/24/2023)   Humiliation, Afraid, Rape, and Kick questionnaire    Fear of Current or Ex-Partner: No    Emotionally Abused: No    Physically Abused: No    Sexually Abused: No    Family History:   Family History  Problem Relation Age of Onset   Arthritis Mother    Dementia Mother  Colon cancer Mother    Arthritis Father    Diabetes Father    Stroke Father    Colon cancer Father    Heart attack Brother    Breast cancer Sister    Seizures Sister    Cancer Brother        brain   Heart disease Brother    Heart attack Brother      ROS:  Please see the history of present illness.   Physical Exam/Data: Vitals:   02/08/24 1713 02/08/24 1722  BP: 104/74   Pulse: 78   Resp: 16   Temp: 99 F (37.2 C)   SpO2: 98%   Weight:  117.5 kg  Height:  5' 6 (1.676 m)   No intake or output data in the 24 hours ending 02/08/24 1901    02/08/2024    5:22 PM 01/31/2024    8:25 AM 01/23/2024     8:05 AM  Last 3 Weights  Weight (lbs) 259 lb 0.7 oz 259 lb 264 lb  Weight (kg) 117.5 kg 117.482 kg 119.75 kg     Body mass index is 41.81 kg/m.  General: [name] resting comfortably in no acute distress. HEENT: Normocephalic and atraumatic. Sclera clear. EOMs intact. Neck: Supple. No carotid bruits. No JVD. Heart: RRR. Distinct S1 and S2. No murmurs, gallops, or rubs.  Lungs: No increased work of breathing. Clear to ausculation bilaterally. No wheezes, rhonchi, or rales.  Extremities: No lower extremity edema.    Skin: Warm and dry. Neuro: Alert and oriented x3. No focal deficits. Psych: Normal affect. Responds appropriately.   EKG:  The EKG was personally reviewed and demonstrates:  V paced with underlying atrial flutter. Rate 75 bpm.     Laboratory Data: High Sensitivity Troponin:   Recent Labs  Lab 02/08/24 1732  TROPONINIHS 44*     Chemistry Recent Labs  Lab 02/08/24 1732  NA 139  K 3.6  CL 106  CO2 20*  GLUCOSE 124*  BUN 31*  CREATININE 2.03*  CALCIUM  8.9  GFRNONAA 35*  ANIONGAP 13    No results for input(s): PROT, ALBUMIN, AST, ALT, ALKPHOS, BILITOT in the last 168 hours. Lipids No results for input(s): CHOL, TRIG, HDL, LABVLDL, LDLCALC, CHOLHDL in the last 168 hours.  Hematology Recent Labs  Lab 02/08/24 1732  WBC 11.9*  RBC 4.87  HGB 14.3  HCT 45.0  MCV 92.4  MCH 29.4  MCHC 31.8  RDW 14.6  PLT 245   Thyroid  No results for input(s): TSH, FREET4 in the last 168 hours.  BNPNo results for input(s): BNP, PROBNP in the last 168 hours.  DDimer No results for input(s): DDIMER in the last 168 hours.  Radiology/Studies:  No results found.   Assessment and Plan:  Fall VT s/p recent device upgrade to CRT-D at [facility] Patient has a history of VT.  Initially had ICD placed in 06/2023.  Recently admitted at [facility] for VT and underwent device upgrade to CRT with addition of coronary sinus lead on 8/6 at 2025.   He was discharged from the hospital yesterday and since had 2 falls.  Unclear what caused him to fall (patient states he tripped).  He denied any palpitations, lightheadedness, dizziness.  He did not hit his head or have any loss of consciousness.  We were unable to remote transmission so he was advised to go to the ED due to concern for LV dislodgment.   Device was interrogated and LV and RV leads are  functioning normally with no signs of dislodgment.  Mode was set at VVT at 75 bpm.  This was changed to VVI at 80 bpm.  Otherwise stable and okay with discharge.  Continue current doses of amiodarone  and mexiletine. Will send a message to the office to arrange a wound check in about 10 days per protocol.  Persistent Atrial Fibrillation/ Flutter EKG in th ED shows EKG in the ED shows V paced with underlying atrial flutter at a rate of 75 bpm. - Continue current doses of Amiodarone , Mexilitine, and Toprol -XL. - Continue chronic anticoagulation with Coumadin . - Would ultimately benefit from a cardioversion but this can be done as an outpatient.  Non-Obstructive CAD Noted on cardiac catheterization in 06/2023.  - Not on Aspirin  given chronic anticoagulation with Couamdin. - Continue Crestor  40mg  daily.  Elevated Troponin High-sensitivity troponin 44. EKG V paced but no acute ischemic changes.  - No chest pain.  - Troponin elevation likely from recent CRT placement.  Chronic HFrEF Recent Echo at [facility] at 02/04/2024 showed LVEF of 15-20%, severely reduced RV function, low-flow low gradient aortic stenosis of prosthetic valve with mean gradient of 10 mmHg, mild mitral regurgitation, and moderate to severe tricuspid regurgitation.   - Euvolemic on exam. - Continue current medications: Torsemide  20mg  twice daily, Entresto 24-26mg  twice daily, Toprol -XL 200mg  daily, Spironolactone  25mg  daily.  S/p Mechanical AVR History of bicuspid aortic valve s/p mechanical AVR in 2011. Recent Echo on 02/04/2024 showed   low-flow low gradient aortic stenosis of prosthetic valve with mean gradient of 10 mmHg.  Hypertension History of hypertension but BP has been running soft lately. Stable in the ED> - Per MyChart message from earlier this evening, Dr. Waddell recommended holding Toprol -XL, Entresto, Spironolactone , Torsemide , and Mexiletine if systolic BP <90.  CKD Stage IIIb Creatinine 2.03 today. Baseline around 1.7 to 1.8. - Can recheck at follow-up visit.    Risk Assessment/Risk Scores:   For questions or updates, please contact [facility] Please consult [email or web address] for contact info under    Signed, Callie E Goodrich, [name]  02/08/2024 7:01 PM    I have seen and examined the patient along with Callie Goodrich, PA.  I have reviewed the chart, notes and new data.  I agree with PA/NP's note.  Key new complaints: Had a mechanical fall today.  He is confident that he tripped and did not lose consciousness. Key examination changes: He is obese.  Healthy appearing left subclavian device surgical site, seems to be free of hematoma or infection, with cutaneous staples in place.  Clear lungs.  Regular rhythm.  Crisp mechanical valve clicks.  Faint systolic ejection murmur.  No diastolic murmurs are heard.  No pericardial rub. Key new findings / data: Interrogation of his defibrillator shows unusual programming, but normal function.  It has not recorded any episodes of high ventricular rate and has not delivered therapies.  He has a dual-chamber biventricular [employer] device, but the atrial lead is nonfunctional.  It appears that an attempt to implant an atrial lead was unsuccessful when his device was upgraded from a single-chamber ICD to a CRT-D device (his wife reports that the lead would not fit).  The device is programmed VVT with a lower rate limit at 75 bpm, but at this rate there is relatively poor resynchronization.  Since his heart rate hovers right around 75 bpm.  His ECG shows that his  current rhythm is very slow atrial flutter with 2: 1 AV block and a ventricular rate of  75 bpm.  Creatinine 2.03 is slightly worse than his previous baseline which seems to be around 1.7.  Electrolytes are normal.  PLAN: Increased the lower rate limit to 80 bpm, which seems to provide satisfactory biventricular pacing most of the time. He will not receive full benefit of the CRT device until he has an atrial lead which can provide AV synchrony. At this point he is in atrial flutter and would likely not derive a lot of benefit from CRT anyway so he will also require a cardioversion.  Atrial flutter is quite slow, likely due to treatment with amiodarone . He is on warfarin anticoagulation.  INR not checked during this ER triage visit.  His recent INR on 01/31/2024 was 3.3.  Warfarin was interrupted for his ICD upgrade procedure but has been restarted. He appears compensated hemodynamically.  Some of his heart failure medications have been held due to low blood pressures and we will continue that until his follow-up appointment in clinic.  Will make arrangements for device clinic appointment for wound check.  Also needs INR check early next week.  Jerel Balding, MD, FACC [facility] [phone] 02/08/2024, 7:52 PM

## 2024-02-08 NOTE — Telephone Encounter (Signed)
 Spoke to [employer] who advised she is not at home to help patient with sending a remote transmission, she will be home at 3 to assist. States the app is not connected to his device.  [employer] phone number provided to [facility] and requested she [employer] to see if they can assist. Phone number provided. States she will call and call us  back with update. Was appreciative of call.

## 2024-02-08 NOTE — Progress Notes (Signed)
 Duplicate

## 2024-02-08 NOTE — Telephone Encounter (Addendum)
 Noted the following recommendations from Dr. Waddell:  He will need his bp checked. If below 90, he will need to hold his heart meds except for the amiodarone .  Spoke with [name] to relay recommendations. She states that they are on their way to [facility] ED for assessment (see previous note). KYM Door, PA on call, notified. Also notified [employer] representative on call.  [name] verbalizes understanding of Dr. Adrian recommendations. Will send a list of medications to hold for low BP via MyChart at [name]'s request.  She expressed appreciation of assistance.

## 2024-02-08 NOTE — Telephone Encounter (Signed)
 Spoke with Roberto [name] (DPR). She states that after leaving the hospital yesterday, she drove the pt home. He got out of the car, then walked a few steps and fell into the bushes. Able to help him get up. After getting into the house, he fell into the tool cabinet.Told her that his legs went out from under him. Denies hitting his head. His back and side are bruised. He has resumed using a walker now and appears steadier (typically only uses this during a gout flare up). Recommended that Roberto [name] contact PCP office to see if they can work him in for a sooner appointment (currently scheduled for Monday, 02/11/24).  Roberto [name] states she is having trouble getting his medications on track as [facility] has not filled his new prescriptions and refill requests yet. Med list updated to reflect the following changes per [facility] discharge paperwork:  START: Amiodarone  400mg  TID x5 days, then reduce dose to 400mg  daily Mexilitine 150mg  TID Metoprolol  succinate 200mg  daily Entresto 24-26mg  BID  STOP: ASA 81mg  Acetaminophen  Fenofibrate  48mg  Lisinopril  2.5mg  Losartan  25mg  Nystatin  Potassium chloride   Contacted [facility] for status update. Amiodarone , metoprolol , Entresto, warfarin 2.5mg  tabs, and spironolactone  will be available for pickup today. Mexilitine has been ordered and should arrive tomorrow. Pharmacy has requested a refill of baclofen  from PCP office.  Spoke with Roberto [name] to relay updates about prescription availability. She expressed understanding and appreciation of call. Will ask Device team to check on monitoring status as he now has a new device and Roberto [name] was not provided with the SN (also unable to find it in CE).

## 2024-02-08 NOTE — Addendum Note (Signed)
 Addended by: Orie Cuttino C on: 02/08/2024 10:02 AM   Modules accepted: Orders

## 2024-02-08 NOTE — ED Provider Triage Note (Signed)
 Emergency Medicine Provider Triage Evaluation Note  Roberto [name] , a 68 y.o. male  was evaluated in triage.  Pt complains of 2 recent falls and concern for pacemaker wire dislodgement.  Recommended to go to [facility] ED for evaluation of LV lead placement d/t not being able to get remote transmission  Recently admitted to to [facility] for VT 02/02/24  Review of Systems  Positive: See hpi Negative:   Physical Exam  BP 104/74   Pulse 78   Temp 99 F (37.2 C)   Resp 16   Ht 5' 6 (1.676 m)   Wt 117.5 kg   SpO2 98%   BMI 41.81 kg/m  Gen:   Awake, no distress   Resp:  Normal effort  MSK:   Moves extremities without difficulty  Other:    Medical Decision Making  Medically screening exam initiated at 5:47 PM.  Appropriate orders placed.  Roberto [name] was informed that the remainder of the evaluation will be completed by another provider, this initial triage assessment does not replace that evaluation, and the importance of remaining in the ED until their evaluation is complete.  Pacemaker interrogation, XR, lab   Roberto [name], Roberto [name] 02/08/24 2008

## 2024-02-08 NOTE — ED Notes (Signed)
 A crew of cardiology in the pts triage room at present

## 2024-02-09 DIAGNOSIS — R000 Tachycardia, unspecified: Secondary | ICD-10-CM | POA: Diagnosis not present

## 2024-02-10 DIAGNOSIS — Z7901 Long term (current) use of anticoagulants: Secondary | ICD-10-CM | POA: Diagnosis not present

## 2024-02-10 DIAGNOSIS — Z952 Presence of prosthetic heart valve: Secondary | ICD-10-CM | POA: Diagnosis not present

## 2024-02-11 ENCOUNTER — Inpatient Hospital Stay: Admitting: Family Medicine

## 2024-02-11 NOTE — Telephone Encounter (Signed)
 Spoke to [employer] , she is taking care of patient and significant other.  She is aware that Mr Judson has an appointment on 02/19/24 at 1:30 pm with device  clinic  to check  incision site. Direction and address given.   [name] states patient has an appointment with primary on 8/14.25. He will have labs done at [facility] on that day. She is aware that he will need BMP.   ( Lab has been released)

## 2024-02-11 NOTE — Telephone Encounter (Signed)
 If PCP can recheck a BMET today, then we don't need to recheck it next week. His creatinine was slightly up in the ED so I just wanted to make sure he had follow-up of that.  Callie Goodrich,PA   9:22 AM AW Alan JAYSON Fees, RN Thanks guys! I am going to add the patient in on 02/19/24 at 130pm with device clinic for wound/post op follow up on his device. Reena can you let patient know if you are calling him?

## 2024-02-12 NOTE — Telephone Encounter (Signed)
 Requested Prescriptions  Refused Prescriptions Disp Refills   baclofen  (LIORESAL ) 10 MG tablet [Pharmacy Med Name: Baclofen  10 MG Oral Tablet] 30 tablet 0    Sig: TAKE 1/2 TO 1 (ONE-HALF TO ONE) TABLET BY MOUTH AT BEDTIME AS NEEDED     Analgesics:  Muscle Relaxants - baclofen  Failed - 02/12/2024 12:21 PM      Failed - Cr in normal range and within 180 days    Creatinine  Date Value Ref Range Status  05/25/2014 0.85 0.60 - 1.30 mg/dL Final   Creatinine, Ser  Date Value Ref Range Status  02/08/2024 2.03 (H) 0.61 - 1.24 mg/dL Final         Passed - eGFR is 30 or above and within 180 days    EGFR (African American)  Date Value Ref Range Status  05/25/2014 >60 >74mL/min Final   GFR calc Af Amer  Date Value Ref Range Status  05/12/2020 64 >59 mL/min/1.73 Final    Comment:    **In accordance with recommendations from the NKF-ASN Task force,**   [facility] is in the process of updating its eGFR calculation to the   2021 CKD-EPI creatinine equation that estimates kidney function   without a race variable.    EGFR (Non-African Amer.)  Date Value Ref Range Status  05/25/2014 >60 >42mL/min Final    Comment:    eGFR values <73mL/min/1.73 m2 may be an indication of chronic kidney disease (CKD). Calculated eGFR, using the MRDR Study equation, is useful in  patients with stable renal function. The eGFR calculation will not be reliable in acutely ill patients when serum creatinine is changing rapidly. It is not useful in patients on dialysis. The eGFR calculation may not be applicable to patients at the low and high extremes of body sizes, pregnant women, and vegetarians.    GFR, Estimated  Date Value Ref Range Status  02/08/2024 35 (L) >60 mL/min Final    Comment:    (NOTE) Calculated using the CKD-EPI Creatinine Equation (2021)    eGFR  Date Value Ref Range Status  01/23/2024 42 (L) >59 mL/min/1.73 Final         Passed - Valid encounter within last 6 months    Recent  Outpatient Visits           1 week ago H/O mechanical aortic valve replacement   [place] [facility], [name], DO   2 weeks ago H/O mechanical aortic valve replacement   [place] [facility] [place], [name], DO   1 month ago H/O mechanical aortic valve replacement   [place] [facility] Melvin Pao, NP   1 month ago H/O mechanical aortic valve replacement   [place] [facility] [place], [name], DO   1 month ago Atrial fibrillation, unspecified type [facility])   [place] [facility] Herold Hadassah SQUIBB, MD       Future Appointments             In 1 month End, [name], MD [facility]

## 2024-02-12 NOTE — Telephone Encounter (Signed)
 Called pharmacy - PT picked up refill 01/23/2024 - Not due.

## 2024-02-13 ENCOUNTER — Encounter: Payer: Self-pay | Admitting: Family Medicine

## 2024-02-13 ENCOUNTER — Telehealth: Payer: Self-pay | Admitting: Family Medicine

## 2024-02-13 NOTE — Telephone Encounter (Signed)
 Looks like Roberto [name] has been discharged from [name]- can we please get him scheduled for hospital follow up? We may be out of TOC call range

## 2024-02-14 ENCOUNTER — Encounter: Payer: Self-pay | Admitting: Family Medicine

## 2024-02-14 ENCOUNTER — Ambulatory Visit: Admitting: Family Medicine

## 2024-02-14 VITALS — BP 135/83 | HR 96 | Temp 98.3°F | Ht 66.0 in | Wt 255.6 lb

## 2024-02-14 DIAGNOSIS — Z952 Presence of prosthetic heart valve: Secondary | ICD-10-CM | POA: Diagnosis not present

## 2024-02-14 DIAGNOSIS — Z95811 Presence of heart assist device: Secondary | ICD-10-CM

## 2024-02-14 DIAGNOSIS — N179 Acute kidney failure, unspecified: Secondary | ICD-10-CM | POA: Diagnosis not present

## 2024-02-14 DIAGNOSIS — E1159 Type 2 diabetes mellitus with other circulatory complications: Secondary | ICD-10-CM | POA: Diagnosis not present

## 2024-02-14 DIAGNOSIS — I5022 Chronic systolic (congestive) heart failure: Secondary | ICD-10-CM

## 2024-02-14 DIAGNOSIS — I472 Ventricular tachycardia, unspecified: Secondary | ICD-10-CM | POA: Diagnosis not present

## 2024-02-14 DIAGNOSIS — I152 Hypertension secondary to endocrine disorders: Secondary | ICD-10-CM | POA: Diagnosis not present

## 2024-02-14 LAB — COAGUCHEK XS/INR WAIVED
INR: 2.6 — ABNORMAL HIGH (ref 0.9–1.1)
Prothrombin Time: 30.8 s

## 2024-02-14 NOTE — Progress Notes (Addendum)
 BP 135/83   Pulse 96   Temp 98.3 F (36.8 C) (Oral)   Ht 5' 6 (1.676 m)   Wt 255 lb 9.6 oz (115.9 kg)   BMI 41.25 kg/m    Subjective:    Patient ID: Roberto [name], male    DOB: 1956/06/13, 68 y.o.   MRN: [id number]  HPI: Roberto [name] is a 68 y.o. male  Chief Complaint  Patient presents with   Hospitalization Follow-up   Transition of Care Hospital Follow up.   Hospital/Facility: [facility] D/C Physician: Dr. Billy D/C Date:  02/07/24  Records Requested: 02/14/24 Records Received: 02/14/24 Records Reviewed: 02/14/24  Diagnoses on Discharge: Wide-complex tachycardia (Primary Dx);  Chronic HFrEF (heart failure with reduced ejection fraction)  Date of interactive Contact within 48 hours of discharge: 02/08/24 Contact was through: phone  Date of 7 day or 14 day face-to-face visit:  02/14/24  within 7 days  Outpatient Encounter Medications as of 02/14/2024  Medication Sig   acetaminophen  (TYLENOL ) 500 MG tablet Take 1,000 mg by mouth every 6 (six) hours as needed for moderate pain (pain score 4-6).   albuterol  (PROVENTIL ) (2.5 MG/3ML) 0.083% nebulizer solution USE 1 VIAL IN NEBULIZER EVERY 6 HOURS AS NEEDED FOR WHEEZING FOR SHORTNESS OF BREATH   allopurinol  (ZYLOPRIM ) 300 MG tablet Take 1 tablet by mouth once daily   amiodarone  (PACERONE ) 400 MG tablet Take 1 tablet (400 mg total) by mouth Three (3) times a day with a meal for 5 days, THEN 1 tablet (400 mg total) daily.   baclofen  (LIORESAL ) 10 MG tablet Take 0.5-1 tablets (5-10 mg total) by mouth at bedtime as needed.   buPROPion  (WELLBUTRIN  XL) 300 MG 24 hr tablet Take 300 mg by mouth daily.   dapagliflozin  propanediol (FARXIGA ) 10 MG TABS tablet Take 1 tablet (10 mg total) by mouth daily before breakfast.   metFORMIN  (GLUCOPHAGE -XR) 500 MG 24 hr tablet Take 1 tablet by mouth once daily with breakfast   metoprolol  succinate (TOPROL -XL) 100 MG 24 hr tablet Take 200 mg by mouth daily.   mexiletine (MEXITIL ) 150 MG capsule Take  150 mg by mouth 3 (three) times daily.   nortriptyline  (PAMELOR ) 10 MG capsule Take 1 capsule by mouth at bedtime   Omega-3 Fatty Acids (OMEGA 3 PO) Take 1,500 mg by mouth 2 (two) times daily.   rosuvastatin  (CRESTOR ) 40 MG tablet Take 1 tablet (40 mg total) by mouth daily.   sacubitril-valsartan (ENTRESTO) 24-26 MG Take 1 tablet by mouth 2 (two) times daily.   Semaglutide , 1 MG/DOSE, (OZEMPIC , 1 MG/DOSE,) 4 MG/3ML SOPN INJECT 1 MG UNDER THE SKIN ONCE WEEKLY   Spacer/Aero-Holding Raguel FRENCH Use as directed Dx: COPD, J44.9   spironolactone  (ALDACTONE ) 25 MG tablet Take 1 tablet (25 mg total) by mouth daily.   torsemide  (DEMADEX ) 20 MG tablet Take 1 tablet (20 mg total) by mouth 2 (two) times daily.   TRELEGY ELLIPTA  100-62.5-25 MCG/ACT AEPB INHALE 1 PUFF ONCE DAILY   VENTOLIN  HFA 108 (90 Base) MCG/ACT inhaler INHALE 2 PUFFS BY MOUTH EVERY 6 HOURS AS NEEDED FOR WHEEZING OR SHORTNESS OF BREATH   warfarin (COUMADIN ) 1 MG tablet Take 1 tablet (1 mg total) by mouth daily. (Patient taking differently: Take 1 mg by mouth See admin instructions. 4.5 mg by mouth daily)   warfarin (COUMADIN ) 3 MG tablet Take 1 tablet (3 mg total) by mouth daily.   nystatin  (MYCOSTATIN ) 100000 UNIT/ML suspension 5mL swish and spit TID (Patient not taking: Reported on 02/14/2024)   [  DISCONTINUED] predniSONE  (DELTASONE ) 10 MG tablet Take 6 tabs in the AM with food day 1 and 2, 5 tabs days 3 and 4, decrease by 1 every other day until gone. (Patient not taking: Reported on 02/08/2024)   Facility-Administered Encounter Medications as of 02/14/2024  Medication   albuterol  (PROVENTIL ) (2.5 MG/3ML) 0.083% nebulizer solution 2.5 mg  Per Hospitalist: 8/2: Presented to ED w/ asymptomatic VT. ICD adjusted, pt ATPed out of VT back into normal afib. Started on amio & lidocaine  drip.  8/3: Amio gtt -> PO amio.  Subjective / Interval History:   Pt did notably have a few runs of sustained VT overnight, asymptomatic, hemodynamically  stable. He also had softer MAPs, high 50s-low 60s, felt fine, lactate checked and was normal.  Big plan for today is coordinating w/ EP, will see if they would like to perform an ablation or have any other recs.   Roberto [name] is a 68 y.o. male with PMH VT, persistent afib, HFrEF s/p ICD, aortic stenosis s/p mechanical AVR (2016) on Warfarin, CAD, ascending aortic aneurysm, COPD, TIA, T2DM, CKD3a who presented at his wife's urging due to tachycardia and elevated blood pressures, found to be in ventricular tachycardia. On Device interrogation it appears his device was misreading his VT as SVT, thus he was not ATPd or shocked out of the rhythm. Initiated on Amiodarone  and Lidocaine  gtts and device adjusted to recognize the VT and he was ATPd out. Admitted to the CICU for further management.   Neurological  Prior TIA Continue formulary equivalent of home Rosuvastatin  40mg  - Dc'ed ASA 81mg  per pharm recs  Depression Continue Wellbutrin  300mg  daily Nortriptyline  10mg  daily  Pulmonary  COPD  OSA Has a CPAP at home that is old, no longer uses. Diagnosed on sleep study from 2020. Chart history of COPD and is currently taking Trelegy daily with rescue Albuterol . Wheezes on exam but no shortness of breath or productive cough.  - Continue formulary equivalent of Trelegy - PRN Albuterol   Cardiovascular  Ventricular Tachycardia  S/p ICD Seen in December 2024 for monomorphic VT, converted out to afib with Amiodarone  load. He had an ICD implanted prior to discharge. He was re-hospitalized in 08/2023 for slow VT in the 130s requiring device adjustment. Since that time, he has remained on Amiodarone  daily without any palpitations, chest pain, or ICD therapies. On 8/2, his wife was checking his BP and noted it was elevated w/ pulse in the 150s. She thus urged him to present to the ED where he was noted to be in ventricular tachycardia though asymptomatic and normotensive. He remained in VT until he was loaded  with Amiodarone  and device was adjusted to ATP him out of the VT to his persistent afib. He was then started on a Lidocaine  gtt as well. After remaining in regular afib, pt was transitioned off amio drip to PO amio & lidocaine  gtt stopped 8/3 pm. - Ctn PO amio 400mg  TID - Ctn mexilitene 150mg  q8h - Telemetry - EP to evaluate patient, pending recs  - Ctn metoprolol  tartrate 25 BID   HFrEF (EF 25-30%) Diagnosed in 2017, most recent echo in March 2025 with EF of 25-30, reduced from prior in 2024. Prior LHC in 2022 with 60% stenosis of large diagonal branch but otherwise only mild disease of mid LAD and proximal RCA. Updated LHC and RHC in 06/2023 with 60% D1 stenosis, 20% mid-LAD and proximal RCA lesions. PCWP at that time 30, RA 25, RVEDP 18, PA 50/28.  - Diuresis: -  Holding home Torsemide  20mg  BID - GDMT: - Farxiga  10mg  daily - Holding home Lisinopril  2.5mg  daily - Holding home Spironolactone  25mg  daily - Ctn metop tartrate 25mg  BID - Need to clarify home meds with pt's wife, who administers his meds (has not been at hospital yet)  Persistent Afib Prior cardioversion in 08/2023 to sinus rhythm. Remains in atrial fibrillation this admission. Anticoagulated with Warfarin 2/2 mechanical AVR.  - Metop as above - Amiodarone  as above  Renal  AKI on CKD3a Follows with [facility]. Thought secondary to T2DM and multiple renal infarcts. Baseline GFR ~49, cr 1.4-1.5, though more recently has been up to 2 in early July. Creatinine 2.32 on admission. Patient has been taking Torsemide  20mg  BID and denies any changes in his leg swelling or orthopnea. - Hold Torsemide  for now - Urine lytes - UA - Daily BMP, mag  GI  NAI  Infectious Disease  Leukocytosis WBC 19.9 on admission. Potentially in setting of VT. Covid/flu/RSV swab negative. CXR without any infiltrates. Will continue to trend.  - Daily CBC  Heme/Coag  Anticoagulation with Warfarin - monitoring per pharmacy  Endocrine   T2DM Takes Farxiga , Metformin  500mg  daily, and Ozempic  at home.  - Continue Farxiga  - Hold Ozempic , Metformin  - SSI   Diagnostic Tests Reviewed:  CLINICAL INDICATION: CHEST PAIN ; Chest Pain    TECHNIQUE: PA and Lateral Chest Radiographs   COMPARISON: None   FINDINGS:   Sequelae of CABG and aortic valve replacement. A cardiac device overlies the left chest wall with a single ventricular lead.   No focal consolidation. Prominent interstitial markings. No pleural effusion or pneumothorax.   Cardiac silhouette is enlarged.   IMPRESSION:  Mild interstitial pulmonary edema.   Exam Type:     ECHOCARDIOGRAM W COLORFLOW SPECTRAL DOPPLER W CONTRAST  Technical Quality:     Fair  Staff Sonographer:     Elston Reiter Referring Physician:     Duwaine SHAUNNA Louder Reading Fellow:     Paticia Mercy Blanch Ordering Physician:     Beverely Coward MD  Study Info Indications      - reeval LVEF ; Definity /Optison  Procedure(s)   Complete two-dimensional, color flow and Doppler transthoracic echocardiogram is performed with contrast to opacify the left ventricle and to improve the delineation of the left ventricle endocardial borders.  Ultrasound Enhancing Agent/Agitated Saline  ------------------------------ UEA/Ag. Saline:     Definity  Amount:     2.00 ml   Summary   1. Technically difficult study.   2. The left ventricle is moderately dilated in size with mildly increased wall thickness.   3. The left ventricular systolic function is severely decreased, LVEF is visually estimated at 15-20%.   4. The right ventricle is not well visualized but probably normal in size, with severely reduced systolic function.   5. Aortic valve replacement (27 mm [employer] Mechanical - bileaflet, implantation date: 2016).   6. Aortic valve Doppler indices show low flow, low gradient stenosis. The EOA is 1.1cm2 which is smaller than what is expected for this valve. Visually, the valve appears to  open well.   7. There is mild mitral valve regurgitation.   8. There is moderate to severe tricuspid regurgitation.   Left Ventricle   The left ventricle is moderately dilated in size with mildly increased wall thickness. The left ventricular systolic function is severely decreased, LVEF is visually estimated at 15-20%. Left ventricular diastolic function cannot be accurately assessed.  Right Ventricle   The right ventricle is not  well visualized but probably normal in size, with severely reduced systolic function. There is a device lead present in the right ventricle.   Left Atrium   The left atrium is upper normal in size.  Right Atrium   The right atrium is severely dilated in size.   Aortic Valve   Aortic valve replacement (27 mm [employer] Mechanical - bileaflet, implantation date: 2016). The prosthetic aortic valve is well seated. The prosthetic aortic valve leaflets are not well visualized, but appear to open well. Peak AV transvalvular velocity:  2.2 m/s. Mean gradient: 10 mmHg. Doppler velocity index: 0.23. LVOT diameter:  2.4 cm. LVOT stroke volume index: 20 ml/m2. AV estimated effective orifice area (by continuity equation): 1.1 cm2. There is no regurgitation of the prosthetic aortic valve. Aortic valve Doppler indices show low flow, low gradient stenosis. The EOA is 1.1cm2 which is smaller than what is expected for this valve. Visually, the valve appears to open well. AV Doppler velocity ratio (dimensionless index):  0.23.  Mitral Valve   The mitral valve leaflets are mildly thickened with normal leaflet mobility. Mitral annular calcification is present (mild). There is mild mitral valve regurgitation.  Tricuspid Valve   The tricuspid valve leaflets are normal, with normal leaflet mobility. There is moderate to severe tricuspid regurgitation. There is no pulmonary hypertension. TR maximum velocity: 2.4 m/s  Estimated PASP: 31 mmHg.  Pulmonic Valve    Pulmonary valve is not well visualized. There is trivial pulmonic regurgitation. There is no evidence of a significant transvalvular gradient.   Aorta   The aorta is normal in size in the visualized segments.  Inferior Vena Cava   IVC size and inspiratory change suggest mildly elevated right atrial pressure. (5-10 mmHg).  Pericardium/Pleural   There is no evidence of a significant pericardial effusion.   Ventricles ---------------------------------------------------------------------- Name                                 Value        Normal ----------------------------------------------------------------------  LV Dimensions 2D/MM ----------------------------------------------------------------------  IVS Diastolic Thickness (2D)                                1.2 cm       0.6-1.0 LVID Diastole (2D)                  6.6 cm       4.2-5.8  LVPW Diastolic Thickness (2D)                                1.2 cm       0.6-1.0 LVID Systole (2D)                   6.3 cm       2.5-4.0 LVOT Diameter                       2.4 cm               LV Mass Index (2D Cubed)          156 g/m2        49-115  Relative Wall Thickness (2D)  0.36        <=0.42  RV Dimensions 2D/MM ---------------------------------------------------------------------- TAPSE                               1.8 cm         >=1.7  Atria ---------------------------------------------------------------------- Name                                 Value        Normal ----------------------------------------------------------------------  LA Dimensions ---------------------------------------------------------------------- LA Dimension (2D)                   4.5 cm       3.0-4.1 LA Volume Index (4C A-L)        29.99 ml/m2               LA Volume Index (2C A-L)        33.58 ml/m2               LA Volume (BP MOD)                   72 ml               LA Volume Index (BP MOD)        30.65 ml/m2    16.00-34.00  RA Dimensions ---------------------------------------------------------------------- RA Area (4C)                      41.7 cm2        <=18.0 RA Area (4C) Index              17.7 cm2/m2               RA ESV Index (4C MOD)             92 ml/m2         18-32  Left Ventricular Outflow Tract ---------------------------------------------------------------------- Name                                 Value        Normal ----------------------------------------------------------------------  LVOT 2D ---------------------------------------------------------------------- LVOT Diameter                       2.4 cm               LVOT Area                          4.5 cm2                LVOT Doppler ---------------------------------------------------------------------- LVOT Peak Velocity                 0.5 m/s               LVOT VTI                             10 cm               LVOT Stroke Volume                   47 ml  LVOT SI                           20 ml/m2  Aortic Valve ---------------------------------------------------------------------- Name                                 Value        Normal ----------------------------------------------------------------------  AV Doppler ---------------------------------------------------------------------- AV Peak Velocity                   2.2 m/s               AV Peak Gradient                   19 mmHg               AV Mean Gradient                   10 mmHg               AV VTI                               43 cm               AV Area (Cont Eq VTI)              1.1 cm2         >=3.0 AV Area Index (Cont Eq VTI)     0.5 cm2/m2               AV Area (Cont Eq Vel)              1.1 cm2               AV Area Index (Cont Eq Vel)     0.5 cm2/m2               AV DI (Vel)                           0.23               AV DI (VTI)                           0.24  Mitral  Valve ---------------------------------------------------------------------- Name                                 Value        Normal ----------------------------------------------------------------------  MV Diastolic Function ---------------------------------------------------------------------- MV E Peak Velocity                114 cm/s               MV A Peak Velocity                 42 cm/s               MV E/A                                 2.7  MV Annular TDI ---------------------------------------------------------------------- MV Septal e' Velocity             3.5 cm/s         >=8.0 MV E/e' (Septal)                      32.8               MV Lateral e' Velocity           10.9 cm/s        >=10.0 MV E/e' (Lateral)                     10.5               MV e' Average                     7.2 cm/s               MV E/e' (Average)                     21.6  Tricuspid Valve ---------------------------------------------------------------------- Name                                 Value        Normal ----------------------------------------------------------------------  TV Regurgitation Doppler ---------------------------------------------------------------------- TR Peak Velocity                   2.4 m/s                Estimated PAP/RSVP ---------------------------------------------------------------------- RA Pressure                         8 mmHg           <=5 RV Systolic Pressure               31 mmHg           <36  Pulmonic Valve ---------------------------------------------------------------------- Name                                 Value        Normal ----------------------------------------------------------------------  PV Doppler ---------------------------------------------------------------------- PV Peak Velocity                   1.0 m/s  Aorta ---------------------------------------------------------------------- Name                                  Value        Normal ----------------------------------------------------------------------  Ascending Aorta ---------------------------------------------------------------------- Ao Root Diameter (2D)               3.5 cm               Ao Root Diam Index (2D)          1.5 cm/m2   Report Signatures Amended by Nunzio Sherrod Ann  MD on 02/07/2024 07:07 PM Finalized by Nunzio Sherrod Ann  MD on 02/07/2024 07:02 PM Resident Paticia Mercy Blanch on 02/04/2024 01:11 PM Procedure Note  Ann Nunzio Sherrod, MD - 02/07/2024 Formatting of this note might be different from the original. Patient Info Name:     Jayshon Dommer Age:  68 years DOB:     09-04-1955 Gender:     Male MRN:     [id number] Accession #:     [id number] [facility] Account #:     [id number] Ht:     168 cm Wt:     114 kg BSA:     2.36 m2 BP:     85 /     57 mmHg Exam Date:     02/04/2024 10:33 AM Admit Date:     02/02/2024  Exam Type:     ECHOCARDIOGRAM W COLORFLOW SPECTRAL DOPPLER W CONTRAST  Technical Quality:     Fair  Staff Sonographer:     Elston Reiter Referring Physician:     Duwaine SHAUNNA Louder Reading Fellow:     Paticia Mercy Blanch Ordering Physician:     Beverely Coward MD  Study Info Indications      - reeval LVEF ; Definity /Optison  Procedure(s)   Complete two-dimensional, color flow and Doppler transthoracic echocardiogram is performed with contrast to opacify the left ventricle and to improve the delineation of the left ventricle endocardial borders.  Ultrasound Enhancing Agent/Agitated Saline  ------------------------------ UEA/Ag. Saline:     Definity  Amount:     2.00 ml   Summary   1. Technically difficult study.   2. The left ventricle is moderately dilated in size with mildly increased wall thickness.   3. The left ventricular systolic function is severely decreased, LVEF is visually estimated at 15-20%.   4. The right ventricle is not well visualized but  probably normal in size, with severely reduced systolic function.   5. Aortic valve replacement (27 mm [employer] Mechanical - bileaflet, implantation date: 2016).   6. Aortic valve Doppler indices show low flow, low gradient stenosis. The EOA is 1.1cm2 which is smaller than what is expected for this valve. Visually, the valve appears to open well.   7. There is mild mitral valve regurgitation.   8. There is moderate to severe tricuspid regurgitation.   Left Ventricle   The left ventricle is moderately dilated in size with mildly increased wall thickness. The left ventricular systolic function is severely decreased, LVEF is visually estimated at 15-20%. Left ventricular diastolic function cannot be accurately assessed.  Right Ventricle   The right ventricle is not well visualized but probably normal in size, with severely reduced systolic function. There is a device lead present in the right ventricle.   Left Atrium   The left atrium is upper normal in size.  Right Atrium   The right atrium is severely dilated in size.   Aortic Valve   Aortic valve replacement (27 mm [employer] Mechanical - bileaflet, implantation date: 2016). The prosthetic aortic valve is well seated. The prosthetic aortic valve leaflets are not well visualized, but appear to open well. Peak AV transvalvular velocity:  2.2 m/s. Mean gradient: 10 mmHg. Doppler velocity index: 0.23. LVOT diameter:  2.4 cm. LVOT stroke volume index: 20 ml/m2. AV estimated effective orifice area (by continuity equation): 1.1 cm2. There is no regurgitation of the prosthetic aortic valve. Aortic valve Doppler indices show low flow, low gradient stenosis. The EOA is 1.1cm2 which is smaller than what is expected for this valve. Visually, the valve appears to open well. AV Doppler velocity ratio (dimensionless index):  0.23.  Mitral Valve   The mitral valve leaflets are mildly thickened with normal leaflet mobility. Mitral  annular calcification is present (mild). There is mild mitral valve regurgitation.  Tricuspid Valve   The tricuspid valve leaflets  are normal, with normal leaflet mobility. There is moderate to severe tricuspid regurgitation. There is no pulmonary hypertension. TR maximum velocity: 2.4 m/s  Estimated PASP: 31 mmHg.  Pulmonic Valve   Pulmonary valve is not well visualized. There is trivial pulmonic regurgitation. There is no evidence of a significant transvalvular gradient.   Aorta   The aorta is normal in size in the visualized segments.  Inferior Vena Cava   IVC size and inspiratory change suggest mildly elevated right atrial pressure. (5-10 mmHg).  Pericardium/Pleural   There is no evidence of a significant pericardial effusion.   Ventricles ---------------------------------------------------------------------- Name                                 Value        Normal ----------------------------------------------------------------------  LV Dimensions 2D/MM ----------------------------------------------------------------------  IVS Diastolic Thickness (2D)                                1.2 cm       0.6-1.0 LVID Diastole (2D)                  6.6 cm       4.2-5.8  LVPW Diastolic Thickness (2D)                                1.2 cm       0.6-1.0 LVID Systole (2D)                   6.3 cm       2.5-4.0 LVOT Diameter                       2.4 cm               LV Mass Index (2D Cubed)          156 g/m2        49-115  Relative Wall Thickness (2D)                                  0.36        <=0.42  RV Dimensions 2D/MM ---------------------------------------------------------------------- TAPSE                               1.8 cm         >=1.7  Atria ---------------------------------------------------------------------- Name                                 Value        Normal ----------------------------------------------------------------------  LA  Dimensions ---------------------------------------------------------------------- LA Dimension (2D)                   4.5 cm       3.0-4.1 LA Volume Index (4C A-L)        29.99 ml/m2               LA Volume Index (2C A-L)        33.58 ml/m2               LA Volume (BP MOD)  72 ml               LA Volume Index (BP MOD)        30.65 ml/m2   16.00-34.00  RA Dimensions ---------------------------------------------------------------------- RA Area (4C)                      41.7 cm2        <=18.0 RA Area (4C) Index              17.7 cm2/m2               RA ESV Index (4C MOD)             92 ml/m2         18-32  Left Ventricular Outflow Tract ---------------------------------------------------------------------- Name                                 Value        Normal ----------------------------------------------------------------------  LVOT 2D ---------------------------------------------------------------------- LVOT Diameter                       2.4 cm               LVOT Area                          4.5 cm2                LVOT Doppler ---------------------------------------------------------------------- LVOT Peak Velocity                 0.5 m/s               LVOT VTI                             10 cm               LVOT Stroke Volume                   47 ml               LVOT SI                           20 ml/m2  Aortic Valve ---------------------------------------------------------------------- Name                                 Value        Normal ----------------------------------------------------------------------  AV Doppler ---------------------------------------------------------------------- AV Peak Velocity                   2.2 m/s               AV Peak Gradient                   19 mmHg                AV Mean Gradient                   10 mmHg               AV VTI  43 cm               AV Area (Cont Eq VTI)               1.1 cm2         >=3.0 AV Area Index (Cont Eq VTI)     0.5 cm2/m2               AV Area (Cont Eq Vel)              1.1 cm2               AV Area Index (Cont Eq Vel)     0.5 cm2/m2               AV DI (Vel)                           0.23               AV DI (VTI)                           0.24  Mitral Valve ---------------------------------------------------------------------- Name                                 Value        Normal ----------------------------------------------------------------------  MV Diastolic Function ---------------------------------------------------------------------- MV E Peak Velocity                114 cm/s               MV A Peak Velocity                 42 cm/s               MV E/A                                 2.7                MV Annular TDI ---------------------------------------------------------------------- MV Septal e' Velocity             3.5 cm/s         >=8.0 MV E/e' (Septal)                      32.8               MV Lateral e' Velocity           10.9 cm/s        >=10.0 MV E/e' (Lateral)                     10.5               MV e' Average                     7.2 cm/s               MV E/e' (Average)                     21.6  Tricuspid Valve ---------------------------------------------------------------------- Name  Value        Normal ----------------------------------------------------------------------  TV Regurgitation Doppler ---------------------------------------------------------------------- TR Peak Velocity                   2.4 m/s                Estimated PAP/RSVP ---------------------------------------------------------------------- RA Pressure                         8 mmHg           <=5 RV Systolic Pressure               31 mmHg           <36  Pulmonic Valve ---------------------------------------------------------------------- Name                                 Value         Normal ----------------------------------------------------------------------  PV Doppler ---------------------------------------------------------------------- PV Peak Velocity                   1.0 m/s  Aorta ---------------------------------------------------------------------- Name                                 Value        Normal ----------------------------------------------------------------------  Ascending Aorta ---------------------------------------------------------------------- Ao Root Diameter (2D)               3.5 cm               Ao Root Diam Index (2D)          1.5 cm/m2   CLINICAL INDICATION: CARDIAC PACEMAKER IN SITU    TECHNIQUE: PA and Lateral Chest Radiographs   COMPARISON: XR CHEST 2 VIEWS 02/02/2024   FINDINGS:   Left chest biventricular cardiac pacemaker/defibrillator with right ventricular and left ventricular leads.   Prominent pulmonary vasculature and mild interstitial thickening. No pleural effusion or pneumothorax.   Stable enlarged cardiac silhouette. Tortuous thoracic aorta.   IMPRESSION:   * Cardiac pacemaker/defibrillator with new left ventricular lead.  *  Cardiomegaly with interstitial pulmonary edema.   Disposition: Home  Consults: EP, cardiology  Discharge Instructions:  Follow up here  Disease/illness Education: Discussed today  Home Health/Community Services Discussions/Referrals: N/A  Establishment or re-establishment of referral orders for community resources: N/A  Discussion with other health care providers: None  Assessment and Support of treatment regimen adherence: Good  Appointments Coordinated with: Patient  Education for self-management, independent living, and ADLs:  Discussed today  Since getting out of the hospital, Kalil has been feeling back to himself. Had his device replaced. No issues with CP or SOB. No swelling. No other concerns or complaints at this time.   Present Coumadin  dose: 3/3.5mg   alternating Goal: 2.5-3.5  Excessive bruising: no Nose bleeding: no Rectal bleeding: no Prolonged menstrual cycles: N/A Eating diet with consistent amounts of foods containing Vitamin K:yes Any recent antibiotic use? no  LAST INR:  2.6 LAST PT: 30.8  Relevant past medical, surgical, family and social history reviewed and updated as indicated. Interim medical history since our last visit reviewed. Allergies and medications reviewed and updated.  Review of Systems  Constitutional: Negative.   Respiratory: Negative.    Cardiovascular: Negative.   Gastrointestinal: Negative.   Musculoskeletal: Negative.   Neurological: Negative.   Psychiatric/Behavioral: Negative.  Per HPI unless specifically indicated above     Objective:    BP 135/83   Pulse 96   Temp 98.3 F (36.8 C) (Oral)   Ht 5' 6 (1.676 m)   Wt 255 lb 9.6 oz (115.9 kg)   BMI 41.25 kg/m   Wt Readings from Last 3 Encounters:  02/14/24 255 lb 9.6 oz (115.9 kg)  02/08/24 259 lb 0.7 oz (117.5 kg)  01/31/24 259 lb (117.5 kg)    Physical Exam Vitals and nursing note reviewed.  Constitutional:      General: He is not in acute distress.    Appearance: Normal appearance. He is obese. He is not ill-appearing, toxic-appearing or diaphoretic.  HENT:     Head: Normocephalic and atraumatic.     Right Ear: External ear normal.     Left Ear: External ear normal.     Nose: Nose normal.     Mouth/Throat:     Mouth: Mucous membranes are moist.     Pharynx: Oropharynx is clear.  Eyes:     General: No scleral icterus.       Right eye: No discharge.        Left eye: No discharge.     Extraocular Movements: Extraocular movements intact.     Conjunctiva/sclera: Conjunctivae normal.     Pupils: Pupils are equal, round, and reactive to light.  Cardiovascular:     Rate and Rhythm: Normal rate and regular rhythm.     Pulses: Normal pulses.     Heart sounds: Normal heart sounds. No murmur heard.    No friction rub. No  gallop.  Pulmonary:     Effort: Pulmonary effort is normal. No respiratory distress.     Breath sounds: Normal breath sounds. No stridor. No wheezing, rhonchi or rales.  Chest:     Chest wall: No tenderness.  Musculoskeletal:        General: Normal range of motion.     Cervical back: Normal range of motion and neck supple.  Skin:    General: Skin is warm and dry.     Capillary Refill: Capillary refill takes less than 2 seconds.     Coloration: Skin is not jaundiced or pale.     Findings: No bruising, erythema, lesion or rash.  Neurological:     General: No focal deficit present.     Mental Status: He is alert and oriented to person, place, and time. Mental status is at baseline.  Psychiatric:        Mood and Affect: Mood normal.        Behavior: Behavior normal.        Thought Content: Thought content normal.        Judgment: Judgment normal.     Results for orders placed or performed in visit on 02/14/24  CoaguChek XS/INR Waived (STAT)   Collection Time: 02/14/24  8:13 AM  Result Value Ref Range   INR 2.6 (H) 0.9 - 1.1   Prothrombin Time 30.8 sec  Basic metabolic panel with GFR   Collection Time: 02/14/24  9:55 AM  Result Value Ref Range   Glucose 111 (H) 70 - 99 mg/dL   BUN 32 (H) 8 - 27 mg/dL   Creatinine, Ser 7.98 (H) 0.76 - 1.27 mg/dL   eGFR 35 (L) >40 fO/fpw/8.26   BUN/Creatinine Ratio 16 10 - 24   Sodium 139 134 - 144 mmol/L   Potassium 4.2 3.5 - 5.2 mmol/L   Chloride 99 96 - 106  mmol/L   CO2 23 20 - 29 mmol/L   Calcium  9.5 8.6 - 10.2 mg/dL   *Note: Due to a large number of results and/or encounters for the requested time period, some results have not been displayed. A complete set of results can be found in Results Review.      Assessment & Plan:   Problem List Items Addressed This Visit       Cardiovascular and Mediastinum   Chronic HFrEF (heart failure with reduced ejection fraction) (HCC)   Euvolemic today. Continue to follow with cardiology. Call  with any concerns.       VT (ventricular tachycardia) (HCC) - Primary   Resolved following ICD replacement. Continue to follow with cardiology. Call with any concerns.         Other   H/O mechanical aortic valve replacement   INR 2.6. Therapeutic. Will continue current regimen. Continue to monitor. Call with any concerns.       Relevant Orders   CoaguChek XS/INR Waived (STAT) (Completed)   Presence of heart assist device [facility])   Doing well with new device. Has follow up with cardiology next week. Continue to monitor. Call with ay concerns.       Other Visit Diagnoses       AKI (acute kidney injury) (HCC)       Rechecking labs today. Await results. Treat as needed.   Relevant Orders   Basic metabolic panel with GFR (Completed)        Follow up plan: Return in about 1 week (around 02/21/2024) for ok to double book.  >40 minutes spent with patient today

## 2024-02-15 ENCOUNTER — Encounter: Payer: Self-pay | Admitting: Family Medicine

## 2024-02-15 DIAGNOSIS — Z95811 Presence of heart assist device: Secondary | ICD-10-CM | POA: Insufficient documentation

## 2024-02-15 LAB — BASIC METABOLIC PANEL WITH GFR
BUN/Creatinine Ratio: 16 (ref 10–24)
BUN: 32 mg/dL — ABNORMAL HIGH (ref 8–27)
CO2: 23 mmol/L (ref 20–29)
Calcium: 9.5 mg/dL (ref 8.6–10.2)
Chloride: 99 mmol/L (ref 96–106)
Creatinine, Ser: 2.01 mg/dL — ABNORMAL HIGH (ref 0.76–1.27)
Glucose: 111 mg/dL — ABNORMAL HIGH (ref 70–99)
Potassium: 4.2 mmol/L (ref 3.5–5.2)
Sodium: 139 mmol/L (ref 134–144)
eGFR: 35 mL/min/1.73 — ABNORMAL LOW (ref >59)

## 2024-02-15 NOTE — Assessment & Plan Note (Signed)
 Will check BMP. Await results.

## 2024-02-15 NOTE — Assessment & Plan Note (Signed)
Euvolemic today. Continue to follow with cardiology. Call with any concerns.  

## 2024-02-15 NOTE — Assessment & Plan Note (Signed)
 Resolved following ICD replacement. Continue to follow with cardiology. Call with any concerns.

## 2024-02-15 NOTE — Assessment & Plan Note (Signed)
 Doing well with new device. Has follow up with cardiology next week. Continue to monitor. Call with ay concerns.

## 2024-02-15 NOTE — Assessment & Plan Note (Signed)
 INR 2.6. Therapeutic. Will continue current regimen. Continue to monitor. Call with any concerns.

## 2024-02-17 ENCOUNTER — Ambulatory Visit: Payer: Self-pay | Admitting: Family Medicine

## 2024-02-19 ENCOUNTER — Ambulatory Visit: Attending: Cardiology | Admitting: *Deleted

## 2024-02-19 DIAGNOSIS — I472 Ventricular tachycardia, unspecified: Secondary | ICD-10-CM | POA: Diagnosis not present

## 2024-02-19 NOTE — Progress Notes (Signed)
 Normal dual chamber ICD wound check. Wound well healed. Presenting rhythm: BiVP. Routine testing performed. Thresholds, sensing, and impedances consistent with implant measurements with 3.5V safety margin/auto capture until 3 month visit. No treated arrhythmias. Reviewed arm restrictions to continue for 6 weeks total post op. Reviewed shock plan.  Pt enrolled in remote follow-up. Effective BP - 93%

## 2024-02-19 NOTE — Patient Instructions (Signed)

## 2024-02-21 ENCOUNTER — Encounter: Payer: Self-pay | Admitting: Family Medicine

## 2024-02-21 ENCOUNTER — Ambulatory Visit (INDEPENDENT_AMBULATORY_CARE_PROVIDER_SITE_OTHER): Admitting: Family Medicine

## 2024-02-21 VITALS — BP 99/67 | HR 86 | Temp 97.9°F | Ht 66.0 in | Wt 253.0 lb

## 2024-02-21 DIAGNOSIS — R791 Abnormal coagulation profile: Secondary | ICD-10-CM | POA: Diagnosis not present

## 2024-02-21 DIAGNOSIS — Z952 Presence of prosthetic heart valve: Secondary | ICD-10-CM | POA: Diagnosis not present

## 2024-02-21 LAB — COAGUCHEK XS/INR WAIVED
INR: 6.9 (ref 0.9–1.1)
Prothrombin Time: 79.2 s

## 2024-02-21 NOTE — Assessment & Plan Note (Signed)
 For uknown reasons. Will confirm with send out lab. Will hold coumadin  for 2 days and restart 3mg  daily. Follow up Monday.

## 2024-02-21 NOTE — Progress Notes (Signed)
 BP 99/67 (BP Location: Left Arm, Patient Position: Sitting, Cuff Size: Normal)   Pulse 86   Temp 97.9 F (36.6 C) (Oral)   Ht 5' 6 (1.676 m)   Wt 253 lb (114.8 kg)   BMI 40.84 kg/m    Subjective:    Patient ID: Roberto [name], male    DOB: 27-Jan-1956, 68 y.o.   MRN: [id number]  HPI: Roberto [name] is a 68 y.o. male  Chief Complaint  Patient presents with   Coagulation Disorder   Coumadin  Management.  The expected duration of coumadin  treatment is lifelong The reason for anticoagulation is  mechanical heart valve.  Present Coumadin  dose: 3/3.5mg  alternating Goal: 2.5-3.5  Excessive bruising: no Nose bleeding: no Rectal bleeding: no Prolonged menstrual cycles: N/A Eating diet with consistent amounts of foods containing Vitamin K:yes Any recent antibiotic use? no   Relevant past medical, surgical, family and social history reviewed and updated as indicated. Interim medical history since our last visit reviewed. Allergies and medications reviewed and updated.  Review of Systems  Constitutional: Negative.   Respiratory: Negative.    Cardiovascular: Negative.   Musculoskeletal: Negative.   Neurological:  Positive for dizziness. Negative for tremors, seizures, syncope, facial asymmetry, speech difficulty, weakness, light-headedness, numbness and headaches.  Psychiatric/Behavioral: Negative.      Per HPI unless specifically indicated above     Objective:    BP 99/67 (BP Location: Left Arm, Patient Position: Sitting, Cuff Size: Normal)   Pulse 86   Temp 97.9 F (36.6 C) (Oral)   Ht 5' 6 (1.676 m)   Wt 253 lb (114.8 kg)   BMI 40.84 kg/m   Wt Readings from Last 3 Encounters:  02/21/24 253 lb (114.8 kg)  02/14/24 255 lb 9.6 oz (115.9 kg)  02/08/24 259 lb 0.7 oz (117.5 kg)    Physical Exam Vitals and nursing note reviewed.  Constitutional:      General: He is not in acute distress.    Appearance: Normal appearance. He is not ill-appearing, toxic-appearing  or diaphoretic.  HENT:     Head: Normocephalic and atraumatic.     Right Ear: External ear normal.     Left Ear: External ear normal.     Nose: Nose normal.     Mouth/Throat:     Mouth: Mucous membranes are moist.     Pharynx: Oropharynx is clear.  Eyes:     General: No scleral icterus.       Right eye: No discharge.        Left eye: No discharge.     Extraocular Movements: Extraocular movements intact.     Conjunctiva/sclera: Conjunctivae normal.     Pupils: Pupils are equal, round, and reactive to light.  Cardiovascular:     Rate and Rhythm: Normal rate and regular rhythm.     Pulses: Normal pulses.     Heart sounds: Normal heart sounds. No murmur heard.    No friction rub. No gallop.  Pulmonary:     Effort: Pulmonary effort is normal. No respiratory distress.     Breath sounds: Normal breath sounds. No stridor. No wheezing, rhonchi or rales.  Chest:     Chest wall: No tenderness.  Musculoskeletal:        General: Normal range of motion.     Cervical back: Normal range of motion and neck supple.  Skin:    General: Skin is warm and dry.     Capillary Refill: Capillary refill takes less than 2 seconds.  Coloration: Skin is not jaundiced or pale.     Findings: No bruising, erythema, lesion or rash.  Neurological:     General: No focal deficit present.     Mental Status: He is alert and oriented to person, place, and time. Mental status is at baseline.  Psychiatric:        Mood and Affect: Mood normal.        Behavior: Behavior normal.        Thought Content: Thought content normal.        Judgment: Judgment normal.     Results for orders placed or performed in visit on 02/14/24  CoaguChek XS/INR Waived (STAT)   Collection Time: 02/14/24  8:13 AM  Result Value Ref Range   INR 2.6 (H) 0.9 - 1.1   Prothrombin Time 30.8 sec  Basic metabolic panel with GFR   Collection Time: 02/14/24  9:55 AM  Result Value Ref Range   Glucose 111 (H) 70 - 99 mg/dL   BUN 32 (H) 8 -  27 mg/dL   Creatinine, Ser 7.98 (H) 0.76 - 1.27 mg/dL   eGFR 35 (L) >40 fO/fpw/8.26   BUN/Creatinine Ratio 16 10 - 24   Sodium 139 134 - 144 mmol/L   Potassium 4.2 3.5 - 5.2 mmol/L   Chloride 99 96 - 106 mmol/L   CO2 23 20 - 29 mmol/L   Calcium  9.5 8.6 - 10.2 mg/dL   *Note: Due to a large number of results and/or encounters for the requested time period, some results have not been displayed. A complete set of results can be found in Results Review.      Assessment & Plan:   Problem List Items Addressed This Visit       Other   H/O mechanical aortic valve replacement - Primary   For uknown reasons. Will confirm with send out lab. Will hold coumadin  for 2 days and restart 3mg  daily. Follow up Monday.       Relevant Orders   CoaguChek XS/INR Waived   INR/PT   Basic metabolic panel with GFR   Other Visit Diagnoses       Supratherapeutic INR       For uknown reasons. Will confirm with send out lab. Will hold coumadin  for 2 days and restart 3mg  daily. Follow up Monday.   Relevant Orders   INR/PT   Basic metabolic panel with GFR        Follow up plan: Return for Monday- OK to double book.

## 2024-02-22 ENCOUNTER — Ambulatory Visit: Admitting: Pulmonary Disease

## 2024-02-22 ENCOUNTER — Encounter: Payer: Self-pay | Admitting: Pulmonary Disease

## 2024-02-22 ENCOUNTER — Ambulatory Visit: Payer: Self-pay | Admitting: Family Medicine

## 2024-02-22 VITALS — BP 86/56 | HR 80 | Temp 98.2°F | Ht 66.0 in | Wt 252.6 lb

## 2024-02-22 DIAGNOSIS — J449 Chronic obstructive pulmonary disease, unspecified: Secondary | ICD-10-CM

## 2024-02-22 DIAGNOSIS — G4733 Obstructive sleep apnea (adult) (pediatric): Secondary | ICD-10-CM | POA: Diagnosis not present

## 2024-02-22 DIAGNOSIS — R911 Solitary pulmonary nodule: Secondary | ICD-10-CM | POA: Diagnosis not present

## 2024-02-22 DIAGNOSIS — I428 Other cardiomyopathies: Secondary | ICD-10-CM

## 2024-02-22 DIAGNOSIS — R0602 Shortness of breath: Secondary | ICD-10-CM

## 2024-02-22 DIAGNOSIS — J439 Emphysema, unspecified: Secondary | ICD-10-CM

## 2024-02-22 DIAGNOSIS — J4489 Other specified chronic obstructive pulmonary disease: Secondary | ICD-10-CM | POA: Diagnosis not present

## 2024-02-22 DIAGNOSIS — I5022 Chronic systolic (congestive) heart failure: Secondary | ICD-10-CM | POA: Diagnosis not present

## 2024-02-22 DIAGNOSIS — Z95810 Presence of automatic (implantable) cardiac defibrillator: Secondary | ICD-10-CM | POA: Diagnosis not present

## 2024-02-22 LAB — BASIC METABOLIC PANEL WITH GFR
BUN/Creatinine Ratio: 15 (ref 10–24)
BUN: 28 mg/dL — ABNORMAL HIGH (ref 8–27)
CO2: 22 mmol/L (ref 20–29)
Calcium: 9.1 mg/dL (ref 8.6–10.2)
Chloride: 100 mmol/L (ref 96–106)
Creatinine, Ser: 1.84 mg/dL — ABNORMAL HIGH (ref 0.76–1.27)
Glucose: 100 mg/dL — ABNORMAL HIGH (ref 70–99)
Potassium: 4 mmol/L (ref 3.5–5.2)
Sodium: 139 mmol/L (ref 134–144)
eGFR: 39 mL/min/1.73 — ABNORMAL LOW (ref >59)

## 2024-02-22 LAB — PROTIME-INR
INR: 6.2 (ref 0.9–1.2)
Prothrombin Time: 59.5 s — ABNORMAL HIGH (ref 9.1–12.0)

## 2024-02-22 MED ORDER — TRELEGY ELLIPTA 200-62.5-25 MCG/ACT IN AEPB: 1.0000 | INHALATION_SPRAY | Freq: Every day | RESPIRATORY_TRACT | 0 refills | Status: DC

## 2024-02-22 NOTE — Progress Notes (Signed)
 Subjective:    Patient ID: Roberto [name], male    DOB: 27-Jan-1956, 68 y.o.   MRN: [id number]  Patient Care Team: [name], DO as PCP - General (Family Medicine) End, [name], MD as PCP - Cardiology (Cardiology) Shlomo Wilbert SAUNDERS, MD as PCP - Sleep Medicine (Cardiology) Waddell Danelle ORN, MD as PCP - Electrophysiology (Cardiology) Marcelino Gales, MD (Nephrology) McGreal, Inocente HERO, MD as Consulting Physician (Gastroenterology) Lucas Dorise POUR, MD as Consulting Physician (Cardiothoracic Surgery) Willma Camelia CROME, RN (Inactive) as [facility], LCSW as [facility] Care Management Karoline Lima, RN as [facility] Care Management  Chief Complaint  Patient presents with   Consult    Shortness of breath on exertion.     BACKGROUND: Patient is a current smoker who presents for evaluation of shortness of breath on exertion and cough.  He has a history of nonischemic cardiomyopathy and chronic systolic heart failure.  Status post pacer defibrillator implant.  Prior history of obstructive sleep apnea with intolerance of CPAP in the past.  He is kindly referred by Dr. Duwaine Vicci.   HPI Discussed the use of AI scribe software for clinical note transcription with the patient, who gave verbal consent to proceed.  History of Present Illness   Roberto [name] is a 68 year old male with asthma, COPD, and CHF who presents with a persistent cough.  He has been experiencing a persistent cough that began worsening in December 2024, coinciding with the placement of a pacemaker. The cough is characterized by a 'rattle' without productive sputum. Recently, after a pacemaker replacement less than a month ago, his cough has improved. An x-ray at that time showed mild fluid in his lungs.  He has a history of asthma, COPD, and CHF, and has been on Trelegy for at least a year, with a dosage of 100 mcg. He also uses albuterol  and a nebulizer. Previous medications included Breztri  and  Advair, but these were not as effective. His heart function is noted to be low, around 20-25%.  He has a history of sleep apnea, with a sleep study conducted four years ago. He was unable to tolerate CPAP therapy due to discomfort with the mask. His caregiver reports that he does not sleep well at night, often waking up and not achieving restful sleep. He catnaps during the day and uses a hospital bed.  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 He also has PTSD related to his service.  He spent his [employer] service mostly at [employer] [place] and also serving in a ship.  Post-military, he worked as a Naval architect. He is very hard of hearing, which affects communication, and he has an upcoming [facility] to address his needs.     DATA 09/19/2023 CT chest without contrast: Stable aneurysm of the ascending thoracic aorta.  Surgical changes from prosthetic aortic valve.  Defibrillator.  Cardiomegaly.  Subtle nodule in the right lung has resolved which may have been infectious or inflammatory.  Long-term stability of the cavitary small lesion in the left upper lobe.  Gynecomastia. 02/08/2024 CXR PA and lateral: Pacer ICD in place, moderate cardiomegaly, status post aortic valve repair, no pleural effusion or pneumothorax. 03/28/2019 PFTs: FEV1 2.07 L or 69% predicted, FVC 2.69 L or 67% predicted, FEV1/FVC 77%, no bronchodilator response.  FVC is reduced relative to SVC indicating air trapping.  SVC is reduced.  Diffusion capacity normal.  ERV low consistent with obesity.  This is consistent with minimal obstructive airways disease and a possible restrictive component likely related to obesity.  Review of Systems A 10 point review of systems was performed and it is as noted above otherwise negative.   Past Medical History:  Diagnosis Date   Aneurysm (HCC)    Bicuspid  aortic valve    a. s/p #27 Carbomedics mechanical valve on 03/25/2010; b. on Coumadin ; c. TTE 12/17: EF 40-45%, moderately dilated LV with moderate LVH, AVR well-seated with 14 mmHg gradient, peak AV velocity 2.5 m/s, mild mitral valve thickening with mild MR, mildly dilated RV with mildly reduced contraction   Cellulitis    CHF (congestive heart failure) (HCC)    Chronic kidney disease    Chronic systolic CHF (congestive heart failure) (HCC)    a. R/LHC 03/2010 showed no significant CAD, LVEDP 31 mmHg, mean AoV gradient 34 mmHg at rest and 47 mmHg with dobutamine 20 mcg/kg/min, AVA 1.0 cm^2, RA 31, RV 68/25, PA 68/47, PCWP 38. PA sat 65%. CO 6.2 L/min (Fick) and 5.3 L/min (thermodilution)   Clotting disorder (HCC)    COPD (chronic obstructive pulmonary disease) (HCC)    H/O mechanical aortic valve replacement 03/25/2010   a. #27 Carbomedics mechanical valve   Hearing loss    High cholesterol    HTN (hypertension)    Hypercholesterolemia    Renal infarct [name]) 2017   Multiple right renal infarcts, likely embolic.   Stage 3 chronic kidney disease (HCC)    Stroke [facility])    TIA (transient ischemic attack) 05/2014    Past Surgical History:  Procedure Laterality Date   AORTIC VALVE REPLACEMENT     AORTIC VALVE SURGERY     CARDIAC CATHETERIZATION  03/21/2010   No significant CAD. Severe aortic stenosis. Severely elevated left and right heart filling pressures.   CARDIAC SURGERY  2009   CHF   CARDIOVERSION N/A 08/28/2023   Procedure: CARDIOVERSION;  Surgeon: [name] Slain, MD;  [facility] INVASIVE CV LAB;  Service: Cardiovascular;  Laterality: N/A;   CARPAL TUNNEL RELEASE Left 2005   COLONOSCOPY N/A 12/13/2023   Procedure: COLONOSCOPY;  Surgeon: Suzann Inocente HERO, MD;  Location: [facility] ENDOSCOPY;  Service: Gastroenterology;  Laterality: N/A;   ICD IMPLANT N/A 06/25/2023   Procedure: ICD IMPLANT;  Surgeon: Waddell Danelle ORN, MD;  Location: [facility] INVASIVE CV LAB;  Service: Cardiovascular;   Laterality: N/A;   PACEMAKER IMPLANT     POLYPECTOMY  12/13/2023   Procedure: POLYPECTOMY, INTESTINE;  Surgeon: Suzann Inocente HERO, MD;  Location: [facility] ENDOSCOPY;  Service: Gastroenterology;;   RIGHT HEART CATH AND CORONARY ANGIOGRAPHY N/A 01/28/2019   Procedure: RIGHT HEART CATH AND CORONARY ANGIOGRAPHY;  Surgeon: [name], MD;  Location: [facility] INVASIVE CV LAB;  Service: Cardiovascular;  Laterality: N/A;   RIGHT HEART CATH AND CORONARY ANGIOGRAPHY N/A 06/20/2023   Procedure: RIGHT HEART CATH AND CORONARY ANGIOGRAPHY;  Surgeon: [name], MD;  Location: [facility] INVASIVE CV LAB;  Service: Cardiovascular;  Laterality: N/A;   TONSILLECTOMY  1962    Patient Active Problem List   Diagnosis Date Noted   Presence of heart assist device (HCC) 02/15/2024   Polyp of colon 12/13/2023   PTSD (post-traumatic stress disorder) 11/15/2023   DM type 2 with diabetic peripheral neuropathy (HCC) 10/23/2023   Essential tremor 10/23/2023   Positive occult stool blood test 10/10/2023   Pacemaker 09/11/2023   Persistent atrial fibrillation (HCC) 07/12/2023  Visit for wound check 07/10/2023   Acute on chronic systolic CHF (congestive heart failure) (HCC) 06/22/2023   VT (ventricular tachycardia) (HCC) 06/18/2023   Hypotension 06/18/2023   Atrial fibrillation (HCC) 06/17/2023   Depression 06/17/2023   Tremor of left hand 05/29/2022   Aneurysm of ascending aorta without rupture (HCC) 02/17/2022   Anticoagulated on Coumadin  07/29/2021   Chronic kidney disease, stage 3a (HCC) 12/09/2020   Hyperlipidemia associated with type 2 diabetes mellitus (HCC) 12/09/2020   Coronary artery disease involving native coronary artery of native heart without angina pectoris 09/10/2019   Osteoarthritis of spine with radiculopathy, lumbar region 11/21/2018   History of aortic stenosis 06/05/2018   Chronic gout without tophus 04/03/2018   Valvular heart disease 01/17/2018   Morbid obesity (HCC) 01/17/2018   Chronic HFrEF  (heart failure with reduced ejection fraction) (HCC) 09/27/2016   Renal infarct (HCC) 05/01/2016   Type 2 diabetes mellitus with cardiac complication (HCC) 04/19/2016   Chronic bilateral low back pain without sciatica 04/19/2016   Thoracic aortic aneurysm without rupture (HCC) 03/16/2016   Aortic atherosclerosis (HCC) 03/16/2016   Nicotine  dependence, cigarettes, uncomplicated 03/14/2016   NICM (nonischemic cardiomyopathy) (HCC) 03/02/2016   COPD (chronic obstructive pulmonary disease) (HCC) 09/09/2015   H/O mechanical aortic valve replacement 02/09/2015   Hypertension associated with diabetes (HCC) 02/09/2015    Family History  Problem Relation Age of Onset   Arthritis Mother    Dementia Mother    Colon cancer Mother    Arthritis Father    Diabetes Father    Stroke Father    Colon cancer Father    Heart attack Brother    Breast cancer Sister    Seizures Sister    Cancer Brother        brain   Heart disease Brother    Heart attack Brother     Social History   Tobacco Use   Smoking status: Every Day    Current packs/day: 0.20    Average packs/day: 0.5 packs/day for 48.1 years (23.7 ttl pk-yrs)    Types: Cigarettes    Start date: 1974    Last attempt to quit: 2021   Smokeless tobacco: Never   Tobacco comments:    3 or less cigarettes per day  Substance Use Topics   Alcohol use: Never    Allergies  Allergen Reactions   Nicotine      Patches caused localized rash and hives     Current Meds  Medication Sig   acetaminophen  (TYLENOL ) 500 MG tablet Take 1,000 mg by mouth every 6 (six) hours as needed for moderate pain (pain score 4-6).   albuterol  (PROVENTIL ) (2.5 MG/3ML) 0.083% nebulizer solution USE 1 VIAL IN NEBULIZER EVERY 6 HOURS AS NEEDED FOR WHEEZING FOR SHORTNESS OF BREATH   allopurinol  (ZYLOPRIM ) 300 MG tablet Take 1 tablet by mouth once daily   amiodarone  (PACERONE ) 400 MG tablet Take 1 tablet (400 mg total) by mouth Three (3) times a day with a meal for 5  days, THEN 1 tablet (400 mg total) daily.   buPROPion  (WELLBUTRIN  XL) 300 MG 24 hr tablet Take 300 mg by mouth daily.   dapagliflozin  propanediol (FARXIGA ) 10 MG TABS tablet Take 1 tablet (10 mg total) by mouth daily before breakfast.   metFORMIN  (GLUCOPHAGE -XR) 500 MG 24 hr tablet Take 1 tablet by mouth once daily with breakfast   metoprolol  succinate (TOPROL -XL) 100 MG 24 hr tablet Take 200 mg by mouth daily.   mexiletine (MEXITIL) 150 MG capsule Take  150 mg by mouth 3 (three) times daily.   nortriptyline  (PAMELOR ) 10 MG capsule Take 1 capsule by mouth at bedtime   nystatin  (MYCOSTATIN ) 100000 UNIT/ML suspension 5mL swish and spit TID   Omega-3 Fatty Acids (OMEGA 3 PO) Take 1,500 mg by mouth 2 (two) times daily.   rosuvastatin  (CRESTOR ) 40 MG tablet Take 1 tablet (40 mg total) by mouth daily.   sacubitril-valsartan (ENTRESTO) 24-26 MG Take 1 tablet by mouth 2 (two) times daily.   Semaglutide , 1 MG/DOSE, (OZEMPIC , 1 MG/DOSE,) 4 MG/3ML SOPN INJECT 1 MG UNDER THE SKIN ONCE WEEKLY   Spacer/Aero-Holding Raguel FRENCH Use as directed Dx: COPD, J44.9   spironolactone  (ALDACTONE ) 25 MG tablet Take 1 tablet (25 mg total) by mouth daily.   torsemide  (DEMADEX ) 20 MG tablet Take 1 tablet (20 mg total) by mouth 2 (two) times daily.   TRELEGY ELLIPTA  100-62.5-25 MCG/ACT AEPB INHALE 1 PUFF ONCE DAILY   VENTOLIN  HFA 108 (90 Base) MCG/ACT inhaler INHALE 2 PUFFS BY MOUTH EVERY 6 HOURS AS NEEDED FOR WHEEZING OR SHORTNESS OF BREATH   warfarin (COUMADIN ) 1 MG tablet Take 1 tablet (1 mg total) by mouth daily. (Patient taking differently: Take 1 mg by mouth See admin instructions. 4.5 mg by mouth daily)   warfarin (COUMADIN ) 3 MG tablet Take 1 tablet (3 mg total) by mouth daily.   Current Facility-Administered Medications for the 02/22/24 encounter (Office Visit) with [name], MD  Medication   albuterol  (PROVENTIL ) (2.5 MG/3ML) 0.083% nebulizer solution 2.5 mg    Immunization History  Administered  Date(s) Administered   Fluad Quad(high Dose 65+) 05/11/2021, 05/29/2022   Fluad Trivalent(High Dose 65+) 04/10/2023   Influenza,inj,Quad PF,6+ Mos 04/19/2016, 05/17/2017, 04/03/2018   PFIZER Comirnaty(Gray Top)Covid-19 Tri-Sucrose Vaccine 10/17/2020   PNEUMOCOCCAL CONJUGATE-20 11/13/2022   Pneumococcal Conjugate-13 05/11/2021   Pneumococcal Polysaccharide-23 06/09/2014   Tdap 06/09/2014, 10/17/2020        Objective:     BP (!) 86/56   Pulse 80   Temp 98.2 F (36.8 C) (Oral)   Ht 5' 6 (1.676 m)   Wt 252 lb 9.6 oz (114.6 kg)   SpO2 100%   BMI 40.77 kg/m   SpO2: 100 %  GENERAL: Chronically ill appearing obese gentleman, no acute distress, fully ambulatory. HEAD: Normocephalic, atraumatic.  EYES: Pupils equal, round, reactive to light.  No scleral icterus.  MOUTH: Poor dentition, missing teeth. NECK: Supple. No thyromegaly. Trachea midline. No JVD.  No adenopathy. PULMONARY: Good air entry bilaterally.  Coarse, rare end expiratory wheeze noted. CARDIOVASCULAR: S1 and S2. Regular rate and rhythm.  ABDOMEN: Obese, protuberant, otherwise benign. MUSCULOSKELETAL: No joint deformity, no clubbing, no edema.  NEUROLOGIC: No overt focal deficit, no gait disturbance, speech is fluent. SKIN: Intact,warm,dry. PSYCH: Mood and behavior normal.      02/22/2024    9:10 AM  Results of the Epworth flowsheet  Sitting and reading 3  Watching TV 3  Sitting, inactive in a public place (e.g. a theatre or a meeting) 2  As a passenger in a car for an hour without a break 3  Lying down to rest in the afternoon when circumstances permit 3  Sitting and talking to someone 0  Sitting quietly after a lunch without alcohol 2  In a car, while stopped for a few minutes in traffic 0  Total score 16   Chest x-ray performed 08 February 2024 shows cardiomegaly, pacer defib, no acute infiltrate:   Representative image from CT scan performed 19 September 2023 showing a left upper lobe nodule that has been  stable since 2022 (arrow):   Assessment & Plan:     ICD-10-CM   1. COPD with chronic bronchitis and emphysema (HCC)  J44.89 Pulmonary function test   J43.9     2. NICM (nonischemic cardiomyopathy) (HCC)  I42.8     3. Chronic HFrEF (heart failure with reduced ejection fraction) (HCC)  I50.22     4. S/P implantation of automatic cardioverter/defibrillator (AICD)  Z95.810     5. Shortness of breath  R06.02 Home sleep test    Pulmonary function test    6. OSA and COPD overlap syndrome (HCC)  G47.33 Home sleep test   J44.9     7. Lung nodule seen on imaging study  R91.1    LUL, stable since 2022      Orders Placed This Encounter  Procedures   Pulmonary function test    Standing Status:   Future    Expiration Date:   02/21/2025    Where should this test be performed?:   Outpatient Pulmonary    What type of PFT is being ordered?:   Full PFT   Home sleep test    Standing Status:   Future    Expected Date:   03/07/2024    Expiration Date:   02/21/2025    Scheduling Instructions:     Snap    Where should this test be performed::   [facility] - Pulmonary    Meds ordered this encounter  Medications   Fluticasone -Umeclidin-Vilant (TRELEGY ELLIPTA ) 200-62.5-25 MCG/ACT AEPB    Sig: Inhale 1 puff into the lungs daily in the afternoon.    Dispense:  1 each    Refill:  0    Lot Number?:   [id number]    Expiration Date?:   08/03/2025    Manufacturer?:   Gl[employer]Smith[employer] [[id number]]    NDC:   9826-9106-38 [332750]    Quantity:   1   Discussion:    Chronic obstructive pulmonary disease and asthma COPD and asthma with persistent cough improved post-pacemaker replacement. Currently on Trelegy 100, albuterol , and nebulizer. Previous Breztri  and Advair trials ineffective. Wheezing suggests suboptimal symptom control. Weight may limit lung expansion. - Provide samples of Trelegy 200 formulation and instruct to rinse mouth after use - Order pulmonary function tests  Heart failure with reduced ejection  fraction Heart failure with ejection fraction of 20-25%. Recent chest x-ray shows cardiomegaly and pacemaker in place. Mild pulmonary edema likely contributing to cough.  Sleep-disordered breathing (suspected) Suspected sleep-disordered breathing with poor sleep quality and daytime catnapping. Previous sleep study four years ago; CPAP not tolerated. May contribute to cardiac stress and dyspnea. Home sleep study preferred for comfort and convenience. - Order home sleep study through [facility]  Follow-Up - Schedule follow-up appointment in 4-6 weeks - Advise to call sooner if any new problems arise      Advised if symptoms do not improve or worsen, to please contact office for sooner follow up or seek emergency care.    I spent 60 minutes of dedicated to the care of this patient on the date of this encounter to include pre-visit review of records, face-to-face time with the patient discussing conditions above, post visit ordering of testing, clinical documentation with the electronic health record, making appropriate referrals as documented, and communicating necessary findings to members of the patients care team.   C. Leita Sanders, MD Advanced Bronchoscopy PCCM [facility]    *This note was dictated using  voice recognition software/Dragon.  Despite best efforts to proofread, errors can occur which can change the meaning. Any transcriptional errors that result from this process are unintentional and may not be fully corrected at the time of dictation.

## 2024-02-22 NOTE — Patient Instructions (Signed)
 VISIT SUMMARY:  During your visit, we discussed your persistent cough, which has improved since your recent pacemaker replacement. We reviewed your history of asthma, COPD, and heart failure, and noted your current medications and treatments. We also discussed your sleep issues and the impact they may have on your overall health.  YOUR PLAN:  -CHRONIC OBSTRUCTIVE PULMONARY DISEASE AND ASTHMA: COPD and asthma are chronic lung conditions that make it hard to breathe. Your persistent cough has improved since your pacemaker replacement, but you still have some wheezing. We will provide you with samples of a stronger Trelegy formulation to help manage your symptoms better. Please remember to rinse your mouth after using it. We will also order pulmonary function tests to assess your lung function.  -HEART FAILURE WITH REDUCED EJECTION FRACTION: Heart failure with reduced ejection fraction means your heart is not pumping blood as well as it should. Your recent chest x-ray showed an enlarged heart and mild fluid in your lungs, which may be contributing to your cough. We will continue to monitor your condition closely.  -SLEEP-DISORDERED BREATHING (SUSPECTED): Sleep-disordered breathing refers to abnormal breathing patterns during sleep, which can affect your sleep quality and overall health. Since you have difficulty sleeping and often nap during the day, we suspect you may have this condition. We will order a home sleep study to make it more comfortable and convenient for you.  INSTRUCTIONS:  Please schedule a follow-up appointment in 4-6 weeks. If you experience any new problems, call us  sooner.

## 2024-02-25 ENCOUNTER — Ambulatory Visit (INDEPENDENT_AMBULATORY_CARE_PROVIDER_SITE_OTHER): Admitting: Family Medicine

## 2024-02-25 ENCOUNTER — Encounter: Payer: Self-pay | Admitting: Family Medicine

## 2024-02-25 ENCOUNTER — Other Ambulatory Visit: Payer: Self-pay

## 2024-02-25 ENCOUNTER — Telehealth: Payer: Self-pay | Admitting: Family Medicine

## 2024-02-25 VITALS — BP 84/64 | HR 80 | Ht 66.0 in | Wt 252.2 lb

## 2024-02-25 DIAGNOSIS — I5022 Chronic systolic (congestive) heart failure: Secondary | ICD-10-CM

## 2024-02-25 DIAGNOSIS — Z952 Presence of prosthetic heart valve: Secondary | ICD-10-CM | POA: Diagnosis not present

## 2024-02-25 DIAGNOSIS — R791 Abnormal coagulation profile: Secondary | ICD-10-CM | POA: Diagnosis not present

## 2024-02-25 DIAGNOSIS — E861 Hypovolemia: Secondary | ICD-10-CM

## 2024-02-25 DIAGNOSIS — J449 Chronic obstructive pulmonary disease, unspecified: Secondary | ICD-10-CM | POA: Diagnosis not present

## 2024-02-25 LAB — CBC
Hematocrit: 46.1 % (ref 37.5–51.0)
Hemoglobin: 15.4 g/dL (ref 13.0–17.7)
MCH: 29.6 pg (ref 26.6–33.0)
MCHC: 33.4 g/dL (ref 31.5–35.7)
MCV: 89 fL (ref 79–97)
Platelets: 330 x10E3/uL (ref 150–450)
RBC: 5.21 x10E6/uL (ref 4.14–5.80)
RDW: 16 % — ABNORMAL HIGH (ref 11.6–15.4)
WBC: 7.3 x10E3/uL (ref 3.4–10.8)

## 2024-02-25 LAB — COAGUCHEK XS/INR WAIVED
INR: 4.4 — ABNORMAL HIGH (ref 0.9–1.1)
Prothrombin Time: 53.1 s

## 2024-02-25 MED ORDER — BACLOFEN 10 MG PO TABS: 5.0000 mg | ORAL_TABLET | Freq: Every evening | ORAL | 0 refills | Status: DC | PRN

## 2024-02-25 MED ORDER — SPIRONOLACTONE 25 MG PO TABS: 12.5000 mg | ORAL_TABLET | Freq: Every day | ORAL | Status: DC

## 2024-02-25 NOTE — Patient Instructions (Signed)
 CUT SPIRONALACTONE IN 1/2 until you see me NO COUMADIN  until you see me

## 2024-02-25 NOTE — Assessment & Plan Note (Signed)
 Improved after a couple of glasses of water, but running very low with only fatigue. Will cut his spironolactone  to 12.5mg  and recheck on Wednesday.

## 2024-02-25 NOTE — Patient Outreach (Unsigned)
 Complex Care Management   Visit Note  02/25/2024  Name:  Roberto [name] MRN: [id number] DOB: 01/08/1956  Situation: Referral received for Complex Care Management related to {Criteria:32550} I obtained verbal consent from {CHL AMB Patient/Caregiver:28184}.  Visit completed with {CHL AMB Patient/Caregiver:28184}  {VISIT LOCATION:32553}  Background:   Past Medical History:  Diagnosis Date   Aneurysm (HCC)    Bicuspid aortic valve    a. s/p #27 Carbomedics mechanical valve on 03/25/2010; b. on Coumadin ; c. TTE 12/17: EF 40-45%, moderately dilated LV with moderate LVH, AVR well-seated with 14 mmHg gradient, peak AV velocity 2.5 m/s, mild mitral valve thickening with mild MR, mildly dilated RV with mildly reduced contraction   Cellulitis    CHF (congestive heart failure) (HCC)    Chronic kidney disease    Chronic systolic CHF (congestive heart failure) (HCC)    a. R/LHC 03/2010 showed no significant CAD, LVEDP 31 mmHg, mean AoV gradient 34 mmHg at rest and 47 mmHg with dobutamine 20 mcg/kg/min, AVA 1.0 cm^2, RA 31, RV 68/25, PA 68/47, PCWP 38. PA sat 65%. CO 6.2 L/min (Fick) and 5.3 L/min (thermodilution)   Clotting disorder (HCC)    COPD (chronic obstructive pulmonary disease) (HCC)    H/O mechanical aortic valve replacement 03/25/2010   a. #27 Carbomedics mechanical valve   Hearing loss    High cholesterol    HTN (hypertension)    Hypercholesterolemia    Renal infarct [facility]) 2017   Multiple right renal infarcts, likely embolic.   Stage 3 chronic kidney disease (HCC)    Stroke (HCC)    TIA (transient ischemic attack) 05/2014    Assessment: Patient Reported Symptoms:  Cognitive        Neurological      HEENT        Cardiovascular      Respiratory      Endocrine      Gastrointestinal        Genitourinary      Integumentary      Musculoskeletal          Psychosocial            02/25/2024    PHQ2-9 Depression Screening   Little interest or pleasure in doing  things    Feeling down, depressed, or hopeless    PHQ-2 - Total Score    Trouble falling or staying asleep, or sleeping too much    Feeling tired or having little energy    Poor appetite or overeating     Feeling bad about yourself - or that you are a failure or have let yourself or your family down    Trouble concentrating on things, such as reading the newspaper or watching television    Moving or speaking so slowly that other people could have noticed.  Or the opposite - being so fidgety or restless that you have been moving around a lot more than usual    Thoughts that you would be better off dead, or hurting yourself in some way    PHQ2-9 Total Score    If you checked off any problems, how difficult have these problems made it for you to do your work, take care of things at home, or get along with other people    Depression Interventions/Treatment      There were no vitals filed for this visit.  Medications Reviewed Today   Medications were not reviewed in this encounter     Recommendation:   {RECOMMENDATONS:32554}  Follow Up Plan:   {  FOLLOWUP:32559}  SIG ***

## 2024-02-25 NOTE — Progress Notes (Signed)
 BP (!) 84/64   Pulse 80   Ht 5' 6 (1.676 m)   Wt 252 lb 3.2 oz (114.4 kg)   BMI 40.71 kg/m    Subjective:    Patient ID: Roberto [name], male    DOB: [birth year], 68 y.o.   MRN: [id number]  HPI: Roberto [name] is a 68 y.o. male  Chief Complaint  Patient presents with   Anticoagulation   Coumadin  Management.  The expected duration of coumadin  treatment is lifelong The reason for anticoagulation is  mechanical heart valve.  Present Coumadin  dose: 3mg  on Sunday- otherwise on hold since Thursday Goal:  2.5-3.5 Excessive bruising: no Nose bleeding: no Rectal bleeding: no Prolonged menstrual cycles: N/A Eating diet with consistent amounts of foods containing Vitamin K:yes Any recent antibiotic use? no  HYPOTENSION- feeling well, no symptoms except fatigue  Hypertension status: BP running low  Satisfied with current treatment? yes Duration of hypertension: chronic BP monitoring frequency:  a few times a month BP range: usually in the 80s/60s BP medication side effects:  no Medication compliance: excellent compliance Previous BP meds:trsemide, spironolactone , entresto, metoprolol  Aspirin : no Recurrent headaches: no Visual changes: no Palpitations: no Dyspnea: no Chest pain: no Lower extremity edema: no Dizzy/lightheaded: no   Relevant past medical, surgical, family and social history reviewed and updated as indicated. Interim medical history since our last visit reviewed. Allergies and medications reviewed and updated.  Review of Systems  Constitutional:  Positive for fatigue. Negative for activity change, appetite change, chills, diaphoresis, fever and unexpected weight change.  Respiratory: Negative.    Cardiovascular: Negative.   Musculoskeletal: Negative.   Neurological: Negative.   Psychiatric/Behavioral: Negative.      Per HPI unless specifically indicated above     Objective:    BP (!) 84/64   Pulse 80   Ht 5' 6 (1.676 m)   Wt 252 lb 3.2 oz  (114.4 kg)   BMI 40.71 kg/m   Wt Readings from Last 3 Encounters:  02/25/24 252 lb 3.2 oz (114.4 kg)  02/22/24 252 lb 9.6 oz (114.6 kg)  02/21/24 253 lb (114.8 kg)    Physical Exam Vitals and nursing note reviewed.  Constitutional:      General: He is not in acute distress.    Appearance: Normal appearance. He is obese. He is not ill-appearing, toxic-appearing or diaphoretic.  HENT:     Head: Normocephalic and atraumatic.     Right Ear: External ear normal.     Left Ear: External ear normal.     Nose: Nose normal.     Mouth/Throat:     Mouth: Mucous membranes are moist.     Pharynx: Oropharynx is clear.  Eyes:     General: No scleral icterus.       Right eye: No discharge.        Left eye: No discharge.     Extraocular Movements: Extraocular movements intact.     Conjunctiva/sclera: Conjunctivae normal.     Pupils: Pupils are equal, round, and reactive to light.  Cardiovascular:     Rate and Rhythm: Normal rate and regular rhythm.     Pulses: Normal pulses.     Heart sounds: Murmur heard.     No friction rub. No gallop.  Pulmonary:     Effort: Pulmonary effort is normal. No respiratory distress.     Breath sounds: Normal breath sounds. No stridor. No wheezing, rhonchi or rales.  Chest:     Chest wall: No tenderness.  Musculoskeletal:  General: Normal range of motion.     Cervical back: Normal range of motion and neck supple.     Right lower leg: No edema.     Left lower leg: No edema.  Skin:    General: Skin is warm and dry.     Capillary Refill: Capillary refill takes less than 2 seconds.     Coloration: Skin is not jaundiced or pale.     Findings: No bruising, erythema, lesion or rash.  Neurological:     General: No focal deficit present.     Mental Status: He is alert and oriented to person, place, and time. Mental status is at baseline.  Psychiatric:        Mood and Affect: Mood normal.        Behavior: Behavior normal.        Thought Content: Thought  content normal.        Judgment: Judgment normal.     Results for orders placed or performed in visit on 02/25/24  CoaguChek XS/INR Waived   Collection Time: 02/25/24  8:19 AM  Result Value Ref Range   INR 4.4 (H) 0.9 - 1.1   Prothrombin Time 53.1 sec  CBC   Collection Time: 02/25/24  8:52 AM  Result Value Ref Range   WBC 7.3 3.4 - 10.8 x10E3/uL   RBC 5.21 4.14 - 5.80 x10E6/uL   Hemoglobin 15.4 13.0 - 17.7 g/dL   Hematocrit 53.8 62.4 - 51.0 %   MCV 89 79 - 97 fL   MCH 29.6 26.6 - 33.0 pg   MCHC 33.4 31.5 - 35.7 g/dL   RDW 83.9 (H) 88.3 - 84.5 %   Platelets 330 150 - 450 x10E3/uL   *Note: Due to a large number of results and/or encounters for the requested time period, some results have not been displayed. A complete set of results can be found in Results Review.      Assessment & Plan:   Problem List Items Addressed This Visit       Cardiovascular and Mediastinum   Chronic HFrEF (heart failure with reduced ejection fraction) (HCC)   Vascular congestion on his CXR last week at pulmonology. BP very low. Will get him into CHF clinic appointment scheduled for Wednesday. Await their input.       Relevant Medications   spironolactone  (ALDACTONE ) 25 MG tablet   Hypotension   Improved after a couple of glasses of water, but running very low with only fatigue. Will cut his spironolactone  to 12.5mg  and recheck on Wednesday.      Relevant Medications   spironolactone  (ALDACTONE ) 25 MG tablet   Other Relevant Orders   CBC (Completed)   Basic metabolic panel with GFR     Other   H/O mechanical aortic valve replacement - Primary   INR remains high at 4.4- down from 6.9. Will hold coumadin  for another 2 days and recheck on Wednesday.       Relevant Orders   CoaguChek XS/INR Waived (Completed)   Other Visit Diagnoses       Supratherapeutic INR       INR remains high at 4.4- down from 6.9. Will hold coumadin  for another 2 days and recheck on Wednesday.        Follow up  plan: Return Wednesday- OK to double book.

## 2024-02-25 NOTE — Assessment & Plan Note (Signed)
 Vascular congestion on his CXR last week at pulmonology. BP very low. Will get him into CHF clinic appointment scheduled for Wednesday. Await their input.

## 2024-02-25 NOTE — Assessment & Plan Note (Signed)
 INR remains high at 4.4- down from 6.9. Will hold coumadin  for another 2 days and recheck on Wednesday.

## 2024-02-25 NOTE — Telephone Encounter (Signed)
 Patient failed to let provider know that pharmacy will not fill the RX for Bacolofen  until approved by provide. Please advise.

## 2024-02-26 ENCOUNTER — Telehealth: Payer: Self-pay | Admitting: Family

## 2024-02-26 LAB — BASIC METABOLIC PANEL WITH GFR
BUN/Creatinine Ratio: 14 (ref 10–24)
BUN: 29 mg/dL — ABNORMAL HIGH (ref 8–27)
CO2: 23 mmol/L (ref 20–29)
Calcium: 9.3 mg/dL (ref 8.6–10.2)
Chloride: 98 mmol/L (ref 96–106)
Creatinine, Ser: 2.09 mg/dL — ABNORMAL HIGH (ref 0.76–1.27)
Glucose: 111 mg/dL — ABNORMAL HIGH (ref 70–99)
Potassium: 3.9 mmol/L (ref 3.5–5.2)
Sodium: 138 mmol/L (ref 134–144)
eGFR: 34 mL/min/1.73 — ABNORMAL LOW (ref >59)

## 2024-02-26 NOTE — Telephone Encounter (Signed)
 Called to confirm/remind patient of their appointment at the [facility] on 02/27/24.   Appointment:   [x] Confirmed  [] Left mess   [] No answer/No voice mail  [] VM Full/unable to leave message  [] Phone not in service  Patient reminded to bring all medications and/or complete list.  Confirmed patient has transportation. Gave directions, instructed to utilize valet parking.

## 2024-02-27 ENCOUNTER — Encounter: Payer: Self-pay | Admitting: Family

## 2024-02-27 ENCOUNTER — Encounter: Payer: Self-pay | Admitting: Family Medicine

## 2024-02-27 ENCOUNTER — Ambulatory Visit (INDEPENDENT_AMBULATORY_CARE_PROVIDER_SITE_OTHER): Admitting: Family Medicine

## 2024-02-27 ENCOUNTER — Ambulatory Visit: Attending: Family | Admitting: Family

## 2024-02-27 VITALS — BP 78/57 | HR 80 | Wt 253.0 lb

## 2024-02-27 VITALS — BP 79/53 | HR 80 | Ht 66.0 in | Wt 251.8 lb

## 2024-02-27 DIAGNOSIS — I4819 Other persistent atrial fibrillation: Secondary | ICD-10-CM | POA: Insufficient documentation

## 2024-02-27 DIAGNOSIS — Z952 Presence of prosthetic heart valve: Secondary | ICD-10-CM | POA: Insufficient documentation

## 2024-02-27 DIAGNOSIS — I25118 Atherosclerotic heart disease of native coronary artery with other forms of angina pectoris: Secondary | ICD-10-CM

## 2024-02-27 DIAGNOSIS — I5022 Chronic systolic (congestive) heart failure: Secondary | ICD-10-CM | POA: Diagnosis not present

## 2024-02-27 DIAGNOSIS — E861 Hypovolemia: Secondary | ICD-10-CM

## 2024-02-27 DIAGNOSIS — I4811 Longstanding persistent atrial fibrillation: Secondary | ICD-10-CM | POA: Diagnosis not present

## 2024-02-27 DIAGNOSIS — Z7984 Long term (current) use of oral hypoglycemic drugs: Secondary | ICD-10-CM | POA: Insufficient documentation

## 2024-02-27 DIAGNOSIS — I472 Ventricular tachycardia, unspecified: Secondary | ICD-10-CM | POA: Diagnosis not present

## 2024-02-27 DIAGNOSIS — E669 Obesity, unspecified: Secondary | ICD-10-CM | POA: Insufficient documentation

## 2024-02-27 DIAGNOSIS — G4733 Obstructive sleep apnea (adult) (pediatric): Secondary | ICD-10-CM | POA: Diagnosis not present

## 2024-02-27 DIAGNOSIS — I7121 Aneurysm of the ascending aorta, without rupture: Secondary | ICD-10-CM | POA: Diagnosis not present

## 2024-02-27 DIAGNOSIS — Z950 Presence of cardiac pacemaker: Secondary | ICD-10-CM | POA: Diagnosis not present

## 2024-02-27 DIAGNOSIS — I350 Nonrheumatic aortic (valve) stenosis: Secondary | ICD-10-CM | POA: Insufficient documentation

## 2024-02-27 DIAGNOSIS — I2510 Atherosclerotic heart disease of native coronary artery without angina pectoris: Secondary | ICD-10-CM | POA: Diagnosis not present

## 2024-02-27 DIAGNOSIS — Z8679 Personal history of other diseases of the circulatory system: Secondary | ICD-10-CM | POA: Insufficient documentation

## 2024-02-27 DIAGNOSIS — Z7901 Long term (current) use of anticoagulants: Secondary | ICD-10-CM | POA: Insufficient documentation

## 2024-02-27 DIAGNOSIS — I130 Hypertensive heart and chronic kidney disease with heart failure and stage 1 through stage 4 chronic kidney disease, or unspecified chronic kidney disease: Secondary | ICD-10-CM | POA: Diagnosis not present

## 2024-02-27 DIAGNOSIS — R9431 Abnormal electrocardiogram [ECG] [EKG]: Secondary | ICD-10-CM | POA: Diagnosis not present

## 2024-02-27 DIAGNOSIS — I255 Ischemic cardiomyopathy: Secondary | ICD-10-CM | POA: Insufficient documentation

## 2024-02-27 DIAGNOSIS — Z7982 Long term (current) use of aspirin: Secondary | ICD-10-CM | POA: Diagnosis not present

## 2024-02-27 DIAGNOSIS — R791 Abnormal coagulation profile: Secondary | ICD-10-CM

## 2024-02-27 DIAGNOSIS — Z79899 Other long term (current) drug therapy: Secondary | ICD-10-CM | POA: Diagnosis not present

## 2024-02-27 LAB — COAGUCHEK XS/INR WAIVED
INR: 3.7 — ABNORMAL HIGH (ref 0.9–1.1)
Prothrombin Time: 44.3 s

## 2024-02-27 MED ORDER — TORSEMIDE 20 MG PO TABS: 20.0000 mg | ORAL_TABLET | Freq: Every day | ORAL | 5 refills | Status: DC

## 2024-02-27 NOTE — Assessment & Plan Note (Signed)
 Improved at 3.7- will restart 2mg  coumadin  tonight and recheck Friday. Friend thinks that [facility] started him on eliquis- not on the record. Discussed he would not be a candidate for eliquis due to his mechanical valve. Will get pharmacy involved to help sort this out- suspect confusion with entresto.

## 2024-02-27 NOTE — Progress Notes (Signed)
 [facility] NOTE   Referring Physician: [name], DO  Primary Care: [name], DO (last seen 02/25/24) Primary Cardiologist: Mady Bruckner, MD / Further, Cadence, PA (last seen 05/25)  CC: shortness of breath  HPI:  Roberto [name] is a 68 y.o. male with aortic stenosis status post mechanical AVR, large thoracic aortic aneurysm followed by cardiac surgery, chronic systolic heart failure and nonobstructive coronary artery disease.   His cardiac history dates back to March 25, 2010 when he underwent mechanical aortic valve replacement; has been on warfarin since that time. Echocardiogram in 2020 with reduction in LVEF to 40 to 45% and dilation of the ascending aorta measuring 4.6 cm. In 2022 he underwent left and right heart catheterization which demonstrated 60% stenosis of a large diagonal branch but otherwise only mild disease of the mid LAD and proximal RCA. In 2023 repeat echo with reduction in EF to 35 to 40%.   Presented to [facility] in 12/24. Initial EKG was consistent with monomorphic ventricular tachycardia that resolved with IV amiodarone  with repeat EKG demonstrating atrial fibrillation with PVCs. LHC with stable nonobstructive CAD; RHC w/ cardiac index severely reduced at 1.9 L/min/m2 with severely elevated filling pressures. He was briefly placed on inotropes and aggressively diuresed.   Admitted in 2/25 with atrial fibrillation and some episodes of VT requiring ATP.   Seen in [facility] 03/25 where losartan  was increased to 25mg  daily.   Hospital admission 02/02/2024 [facility] for Wide Complex Tachycardia. Surgery on 02/06/2024 for ICD replacement.    He presents today for a HF follow-up visit with a chief complaint of shortness of breath. Per pt he has noted changes to balance, decreased appeitite, poor sleep. Per wife has noted dizziness, slurred speech, poor sleeping habits. Both since hospital admission.   Reports SHOB with walking, needed a  wheelchair for appointment today. Denies chest pain, palpitations, lightheadedness, swelling, abdominal distention, and cough. Drinks about 32 oz (2 bottles of water a day) with decreased appetite.   Frequent interruptions in sleep, no cause specified and uses 3 pillows. Denies orthopnea and nocturnal paroxsymal dyspnea.   [facility], needs benefits and nexus letter. Labs were performed.   Upcoming appointment with Dr.End (Cardiac) on 9/17.   Wife manages medication and ensures adherence with medications.    ROS: All systems negative except what is listed in HPI, PMH and Problem List   Past Medical History:  Diagnosis Date   Aneurysm (HCC)    Bicuspid aortic valve    a. s/p #27 Carbomedics mechanical valve on 03/25/2010; b. on Coumadin ; c. TTE 12/17: EF 40-45%, moderately dilated LV with moderate LVH, AVR well-seated with 14 mmHg gradient, peak AV velocity 2.5 m/s, mild mitral valve thickening with mild MR, mildly dilated RV with mildly reduced contraction   Cellulitis    CHF (congestive heart failure) (HCC)    Chronic kidney disease    Chronic systolic CHF (congestive heart failure) (HCC)    a. R/LHC 03/2010 showed no significant CAD, LVEDP 31 mmHg, mean AoV gradient 34 mmHg at rest and 47 mmHg with dobutamine 20 mcg/kg/min, AVA 1.0 cm^2, RA 31, RV 68/25, PA 68/47, PCWP 38. PA sat 65%. CO 6.2 L/min (Fick) and 5.3 L/min (thermodilution)   Clotting disorder (HCC)    COPD (chronic obstructive pulmonary disease) (HCC)    H/O mechanical aortic valve replacement 03/25/2010   a. #27 Carbomedics mechanical valve   Hearing loss    High cholesterol    HTN (hypertension)  Hypercholesterolemia    Renal infarct [facility]) 2017   Multiple right renal infarcts, likely embolic.   Stage 3 chronic kidney disease (HCC)    Stroke (HCC)    TIA (transient ischemic attack) 05/2014    Current Outpatient Medications  Medication Sig Dispense Refill   acetaminophen  (TYLENOL ) 500 MG tablet  Take 1,000 mg by mouth every 6 (six) hours as needed for moderate pain (pain score 4-6).     albuterol  (PROVENTIL ) (2.5 MG/3ML) 0.083% nebulizer solution USE 1 VIAL IN NEBULIZER EVERY 6 HOURS AS NEEDED FOR WHEEZING FOR SHORTNESS OF BREATH 180 mL 1   allopurinol  (ZYLOPRIM ) 300 MG tablet Take 1 tablet by mouth once daily 90 tablet 0   amiodarone  (PACERONE ) 400 MG tablet Take 1 tablet (400 mg total) by mouth Three (3) times a day with a meal for 5 days, THEN 1 tablet (400 mg total) daily.     baclofen  (LIORESAL ) 10 MG tablet Take 0.5-1 tablets (5-10 mg total) by mouth at bedtime as needed. 30 each 0   buPROPion  (WELLBUTRIN  XL) 300 MG 24 hr tablet Take 300 mg by mouth daily.     dapagliflozin  propanediol (FARXIGA ) 10 MG TABS tablet Take 1 tablet (10 mg total) by mouth daily before breakfast. 30 tablet 11   Fluticasone -Umeclidin-Vilant (TRELEGY ELLIPTA ) 200-62.5-25 MCG/ACT AEPB Inhale 1 puff into the lungs daily in the afternoon. 1 each 0   metFORMIN  (GLUCOPHAGE -XR) 500 MG 24 hr tablet Take 1 tablet by mouth once daily with breakfast 90 tablet 0   metoprolol  succinate (TOPROL -XL) 100 MG 24 hr tablet Take 200 mg by mouth daily.     mexiletine (MEXITIL) 150 MG capsule Take 150 mg by mouth 3 (three) times daily.     nortriptyline  (PAMELOR ) 10 MG capsule Take 1 capsule by mouth at bedtime 90 capsule 0   nystatin  (MYCOSTATIN ) 100000 UNIT/ML suspension 5mL swish and spit TID 473 mL 1   Omega-3 Fatty Acids (OMEGA 3 PO) Take 1,500 mg by mouth 2 (two) times daily.     rosuvastatin  (CRESTOR ) 40 MG tablet Take 1 tablet (40 mg total) by mouth daily. 90 tablet 3   sacubitril-valsartan (ENTRESTO) 24-26 MG Take 1 tablet by mouth 2 (two) times daily.     Semaglutide , 1 MG/DOSE, (OZEMPIC , 1 MG/DOSE,) 4 MG/3ML SOPN INJECT 1 MG UNDER THE SKIN ONCE WEEKLY 9 mL 1   Spacer/Aero-Holding Chambers DEVI Use as directed Dx: COPD, J44.9 1 each 1   spironolactone  (ALDACTONE ) 25 MG tablet Take 0.5 tablets (12.5 mg total) by mouth  daily.     torsemide  (DEMADEX ) 20 MG tablet Take 1 tablet (20 mg total) by mouth 2 (two) times daily. 180 tablet 2   TRELEGY ELLIPTA  100-62.5-25 MCG/ACT AEPB INHALE 1 PUFF ONCE DAILY 180 each 12   VENTOLIN  HFA 108 (90 Base) MCG/ACT inhaler INHALE 2 PUFFS BY MOUTH EVERY 6 HOURS AS NEEDED FOR WHEEZING OR SHORTNESS OF BREATH 54 g 6   warfarin (COUMADIN ) 1 MG tablet Take 1 tablet (1 mg total) by mouth daily. (Patient taking differently: Take 1 mg by mouth See admin instructions. 4.5 mg by mouth daily) 30 tablet 3   warfarin (COUMADIN ) 3 MG tablet Take 1 tablet (3 mg total) by mouth daily. 30 tablet 3   Current Facility-Administered Medications  Medication Dose Route Frequency Provider Last Rate Last Admin   albuterol  (PROVENTIL ) (2.5 MG/3ML) 0.083% nebulizer solution 2.5 mg  2.5 mg Nebulization Once         Lab Results  Component  Value Date   CREATININE 2.09 (H) 02/25/2024   CREATININE 1.84 (H) 02/21/2024   CREATININE 2.01 (H) 02/14/2024    Vitals:   02/27/24 1130  BP: (!) 78/57  Pulse: 80  Weight: 114.8 kg   Wt Readings from Last 3 Encounters:  02/27/24 114.8 kg  02/27/24 114.2 kg  02/25/24 114.3 kg    PHYSICAL EXAM:  General: Well appearing with some fatigue. No acute signs of distress. HEENT: Normal Neck: Supple, no JVD Cor: Regular rhythm, rate. No rubs, gallops or murmurs Lungs: Clear bilaterally, symmetrical chest expansion, and no cough.  Abdomen: Soft, nontender, nondistended. Extremities: No cyanosis, clubbing, rash, edema Neuro: AO X 3. Affect pleasant Skin: Appropriate for ethnicity. Varicose veins to LE bilaterally.   DATA REVIEW  ECG: 07/23/22: NSR w/ LBBB   09/10/23: NSR w/ LBBB  ECHO: 06/20/23: LVEF 20-25%, moderately reduced RV function.  09/27/23: LVEF 25-30%, G2DD, mildly reduced RV function.   CATH: 06/20/23: Mild to moderate, non-obstructive coronary artery disease with 60% D1 stenosis and 20% mid LAD and proximal RCA lesions.  No significant change  noted from prior angiogram in 2020. Moderately elevated left heart filling pressure (PCWP 30 mmHg with prominent v-waves). Severely elevated right heart filling pressure (RA 25, RVEDP 18 mmHg). Moderate pulmonary hypertension (PA 50/28, mean 35 mmHg). Moderately reduced Fick cardiac output/index (CO 4.4 L/min, CI 1.9 L/min/m^2).  CMR:  06/25/23:  1. Severe LVE with global hypokinesis LVEF 20%  2. Delayed gadolinium images with mid myocardial basal septal uptake not consistent with CAD  3.  Mild RVE with hypokinesis RVEF 41%  5. Severe ascending thoracic aneurysm measuring 5.5 cm Patient does not appear to have had Bentall procedure during his AVR Note non contrast chest CT done 03/17/23 measured this at 5.9 cm He has been seen by Dr Lucas CVTS on 02/14/23 and not thought to be a redo candidate due to co morbidities 7. Bileaflet 27 mm AVR with carbomedics valve mild closing volume AR and normal leaflet motion  Sleep study: 2020 1. Moderate Obstructive Sleep Apnea (G47.33) with AHI 19.6/hr. 2. Mild Oxygen desaturations as low as 83%. Time spent with O2 sats < 88% was 0.3 min. 3. Severe Snoring. 4. Average heart rate 62bpm. 5. Normal sleep onset latency at 30 min. 6. Prolonged REM sleep onset latency at 302 min. 7. Fragmented sleep with 46 awakenings during sleep.   ASSESSMENT & PLAN:  Heart failure with reduced EF Etiology of YQ:pdryzfpr cardiomyopathy with likely nonischemic component NYHA class / AHA Stage:NYHA III Volume status & Diuretics: Euvolemic - Start taking torsemide  20mg  daily instead of 20mg  BID  - Discontinue Entresto 24-26 due to hypotension  - Spirolactone changed yesterday 25mg  to 12.5mg  by [facility]) - Weight decline today 253 lbs from last visit 11/19/23 263 lbs   Beta-Blocker: HR 80; taking metoprolol  succinate 200mg  daily  BP: 78/57, Hypotensive with symptoms, see med changes above  Instructed to monitor BP, follow up sooner if BP significantly  increases.  MRA:Spironolactone  12.5mg  daily Cardiometabolic: Farxiga  10mg  Devices therapies & Valvulopathies: - ICD, implanted 06/25/23 by Dr. Waddell; unable to get device interrogated today - ICD replaced 02/06/24 by Dr. Lennie and Dr. Stephannie at [facility] Advanced therapies: not a candidate at this time.  - BMET/ BNP 02/25/24 Sodium: 138, Potassium: 3.9, BUN: 29, Creatinine: 2.09, GFR.  Persistent Atrial fibrillation  - Amiodarone  200mg  QD    Ventricular tachycardia - Currently taking Amiodarone  200mg  QD for atrial fibrillation  - Followed by Dr. Waddell -  Reviewed device interrogation from 09/25/23; [employer] ICD. No shocks; 1% RV pacing. No arrhythmias detected.   CAD - Stable non-obstructive disease - Continue ASA 81mg  daily and crestor .  - Reviewed Dr. Ulysses notes from 04/25 - No chest pain  Thoracic aortic aneurysm - Large ascending aortic aneurysm; followed by Dr. Lucas.  - This would complicate any attempts at advanced therapies.  - Seen by Dr. Lucas 03/25; stable 5.4x5.6cm fusiform ascending aortic aneurysm. At this time due to comorbid conditions plan is to continue monitoring.   OSA - Sleep study reviewed from 2020. He has an AHI of close to 20/hr; abnormal. - Does not use CPAP since 2021. Reports that his machine/mask is old.  - Referral placed by Dr. Tamea for home sleep test 02/22/24  Obesity - On Ozempic  1mg  weekly; tolerating it well.  - Weight down 10lbs since last visit 11/19/23   Return in 1 month, sooner if needed.   Solomon Blumenthal, FNP-S 11/19/23

## 2024-02-27 NOTE — Assessment & Plan Note (Signed)
 BP low again. Remains asymptomatic. Due to see CHF clinic later today. Continue to monitor.

## 2024-02-27 NOTE — Patient Instructions (Signed)
 2 mg starting tonight

## 2024-02-27 NOTE — Progress Notes (Signed)
 BP (!) 79/53   Pulse 80   Ht 5' 6 (1.676 m)   Wt 251 lb 12.8 oz (114.2 kg)   BMI 40.64 kg/m    Subjective:    Patient ID: Roberto [name], male    DOB: 03/10/1956, 68 y.o.   MRN: [id number]  HPI: Roberto [name] is a 68 y.o. male  Chief Complaint  Patient presents with   Coagulation Disorder        Coumadin  Management.  The expected duration of coumadin  treatment is lifelong The reason for anticoagulation is  mechanical heart valve.  Present Coumadin  dose: has been on hold due to supratherapeutic INR since Sunday (3mg ) Goal:  2.5-3.5 Excessive bruising: no Nose bleeding: no Rectal bleeding: no Prolonged menstrual cycles: N/A Eating diet with consistent amounts of foods containing Vitamin K:yes Any recent antibiotic use? no   Relevant past medical, surgical, family and social history reviewed and updated as indicated. Interim medical history since our last visit reviewed. Allergies and medications reviewed and updated.  Review of Systems  Constitutional: Negative.   Respiratory: Negative.    Cardiovascular: Negative.   Musculoskeletal: Negative.   Neurological: Negative.   Psychiatric/Behavioral: Negative.      Roberto HPI unless specifically indicated above     Objective:    BP (!) 79/53   Pulse 80   Ht 5' 6 (1.676 m)   Wt 251 lb 12.8 oz (114.2 kg)   BMI 40.64 kg/m   Wt Readings from Last 3 Encounters:  02/27/24 251 lb 12.8 oz (114.2 kg)  02/25/24 252 lb (114.3 kg)  02/25/24 252 lb 3.2 oz (114.4 kg)    Physical Exam Vitals and nursing note reviewed.  Constitutional:      General: He is not in acute distress.    Appearance: Normal appearance. He is obese. He is not ill-appearing, toxic-appearing or diaphoretic.  HENT:     Head: Normocephalic and atraumatic.     Right Ear: External ear normal.     Left Ear: External ear normal.     Nose: Nose normal.     Mouth/Throat:     Mouth: Mucous membranes are moist.     Pharynx: Oropharynx is clear.   Eyes:     General: No scleral icterus.       Right eye: No discharge.        Left eye: No discharge.     Extraocular Movements: Extraocular movements intact.     Conjunctiva/sclera: Conjunctivae normal.     Pupils: Pupils are equal, round, and reactive to light.  Cardiovascular:     Rate and Rhythm: Normal rate and regular rhythm.     Pulses: Normal pulses.     Heart sounds: No murmur heard.    No friction rub. No gallop.     Comments: + click Pulmonary:     Effort: Pulmonary effort is normal. No respiratory distress.     Breath sounds: Normal breath sounds. No stridor. No wheezing, rhonchi or rales.  Chest:     Chest wall: No tenderness.  Musculoskeletal:        General: Normal range of motion.     Cervical back: Normal range of motion and neck supple.     Right lower leg: No edema.     Left lower leg: No edema.  Skin:    General: Skin is warm and dry.     Capillary Refill: Capillary refill takes less than 2 seconds.     Coloration: Skin is not jaundiced or  pale.     Findings: No bruising, erythema, lesion or rash.  Neurological:     General: No focal deficit present.     Mental Status: He is alert and oriented to person, place, and time. Mental status is at baseline.  Psychiatric:        Mood and Affect: Mood normal.        Behavior: Behavior normal.        Thought Content: Thought content normal.        Judgment: Judgment normal.    LAST INR: 3.7 LAST PT: 44.3  Results for orders placed or performed in visit on 02/25/24  CoaguChek XS/INR Waived   Collection Time: 02/25/24  8:19 AM  Result Value Ref Range   INR 4.4 (H) 0.9 - 1.1   Prothrombin Time 53.1 sec  CBC   Collection Time: 02/25/24  8:52 AM  Result Value Ref Range   WBC 7.3 3.4 - 10.8 x10E3/uL   RBC 5.21 4.14 - 5.80 x10E6/uL   Hemoglobin 15.4 13.0 - 17.7 g/dL   Hematocrit 53.8 62.4 - 51.0 %   MCV 89 79 - 97 fL   MCH 29.6 26.6 - 33.0 pg   MCHC 33.4 31.5 - 35.7 g/dL   RDW 83.9 (H) 88.3 - 84.5 %    Platelets 330 150 - 450 x10E3/uL  Basic metabolic panel with GFR   Collection Time: 02/25/24  8:52 AM  Result Value Ref Range   Glucose 111 (H) 70 - 99 mg/dL   BUN 29 (H) 8 - 27 mg/dL   Creatinine, Ser 7.90 (H) 0.76 - 1.27 mg/dL   eGFR 34 (L) >40 fO/fpw/8.26   BUN/Creatinine Ratio 14 10 - 24   Sodium 138 134 - 144 mmol/L   Potassium 3.9 3.5 - 5.2 mmol/L   Chloride 98 96 - 106 mmol/L   CO2 23 20 - 29 mmol/L   Calcium  9.3 8.6 - 10.2 mg/dL   *Note: Due to a large number of results and/or encounters for the requested time period, some results have not been displayed. A complete set of results can be found in Results Review.      Assessment & Plan:   Problem List Items Addressed This Visit       Cardiovascular and Mediastinum   Hypotension   BP low again. Remains asymptomatic. Due to see CHF clinic later today. Continue to monitor.       Relevant Orders   AMB Referral VBCI Care Management     Other   H/O mechanical aortic valve replacement - Primary   Improved at 3.7- will restart 2mg  coumadin  tonight and recheck Friday. Friend thinks that [facility] started him on eliquis- not on the record. Discussed he would not be a candidate for eliquis due to his mechanical valve. Will get pharmacy involved to help sort this out- suspect confusion with entresto.      Relevant Orders   CoaguChek XS/INR Waived   AMB Referral VBCI Care Management   Other Visit Diagnoses       Supratherapeutic INR       Improved at 3.7- will restart 2mg  coumadin  tonight and recheck Friday.   Relevant Orders   AMB Referral VBCI Care Management        Follow up plan: Return Friday, OK to double book- next week discuss [facility] letter.

## 2024-02-27 NOTE — Patient Instructions (Addendum)
 Thank you for allowing the Complex Care Management team to participate in your care. It was great speaking with you!  Reminders: -Please complete the PCP outreach as scheduled on February 27, 2024. -Please complete outreach with the Cardiology team as scheduled on March 19, 2024.  We will follow up on March 25, 2024 at 1130. Please do not hesitate to contact me if you require assistance prior to our next outreach.   Jackson Acron [facility] Population Health RN Care Manager Direct Dial: [phone]  Fax: [phone] Website: [email or web address]

## 2024-02-27 NOTE — Patient Instructions (Signed)
 Medication Changes:  DISCONTINUE ENTRESTO  DECREASE TORSEMIDE  TO 20 MG ONCE DAILY   Follow-Up in: 1 MONTH WITH TINA HACKNEY, FNP.   Thank you for choosing [facility].    At the [facility] Clinic, you and your health needs are our priority. We have a designated team specialized in the treatment of Heart Failure. This Care Team includes your primary Heart Failure Specialized Cardiologist (physician), Advanced Practice Providers (APPs- Physician Assistants and Nurse Practitioners), and Pharmacist who all work together to provide you with the care you need, when you need it.   You may see any of the following providers on your designated Care Team at your next follow up:  Dr. Toribio Fuel Dr. Ezra Shuck Dr. Ria Commander Dr. Morene Brownie [name], FNP Jaun Bash, RPH-CPP  Please be sure to bring in all your medications bottles to every appointment.   Need to Contact Us :  If you have any questions or concerns before your next appointment please send us  a message through [place] or call our office at [phone].    TO LEAVE A MESSAGE FOR THE NURSE SELECT OPTION 2, PLEASE LEAVE A MESSAGE INCLUDING: YOUR NAME DATE OF BIRTH CALL BACK NUMBER REASON FOR CALL**this is important as we prioritize the call backs  YOU WILL RECEIVE A CALL BACK THE SAME DAY AS LONG AS YOU CALL BEFORE 4:00 PM

## 2024-02-28 ENCOUNTER — Telehealth: Payer: Self-pay

## 2024-02-28 NOTE — Progress Notes (Signed)
 Care Guide Pharmacy Note  02/28/2024 Name: Roberto [name] MRN: [id number] DOB: 04/20/1956  Referred By: [name], DO Reason for referral: Complex Care Management (Outreach to schedule with Pharm d )   Roberto [name] is a 68 y.o. year old male who is a primary care patient of [name], DO.  Roberto [name] was referred to the pharmacist for assistance related to: polypharmacy   Successful contact was made with the patient to discuss pharmacy services including being ready for the pharmacist to call at least 5 minutes before the scheduled appointment time and to have medication bottles and any blood pressure readings ready for review. The patient agreed to meet with the pharmacist via telephone visit on (date/time).02/29/2024  Roberto [name] , RMA     [place]  [facility] Guide  Direct Dial: [phone]  Website: [email or web address]

## 2024-02-29 ENCOUNTER — Other Ambulatory Visit: Payer: Self-pay

## 2024-02-29 ENCOUNTER — Encounter: Payer: Self-pay | Admitting: Family Medicine

## 2024-02-29 ENCOUNTER — Ambulatory Visit (INDEPENDENT_AMBULATORY_CARE_PROVIDER_SITE_OTHER): Admitting: Family Medicine

## 2024-02-29 VITALS — BP 86/61 | HR 83 | Temp 97.5°F | Ht 66.0 in | Wt 251.4 lb

## 2024-02-29 DIAGNOSIS — Z952 Presence of prosthetic heart valve: Secondary | ICD-10-CM

## 2024-02-29 LAB — COAGUCHEK XS/INR WAIVED
INR: 2.3 — ABNORMAL HIGH (ref 0.9–1.1)
Prothrombin Time: 28.1 s

## 2024-02-29 NOTE — Assessment & Plan Note (Signed)
 Improved with INR 2.3- but no longer therapeutic. Continue 2mg  and recheck on Tuesday

## 2024-02-29 NOTE — Progress Notes (Signed)
 BP (!) 86/61   Pulse 83   Temp (!) 97.5 F (36.4 C) (Oral)   Ht 5' 6 (1.676 m)   Wt 251 lb 6.4 oz (114 kg)   SpO2 90%   BMI 40.58 kg/m    Subjective:    Patient ID: Roberto [name], male    DOB: June 22, 1956, 68 y.o.   MRN: [id number]  HPI: Roberto [name] is a 68 y.o. male  Chief Complaint  Patient presents with   INR CHECK   Coumadin  Management.  The expected duration of coumadin  treatment is lifelong The reason for anticoagulation is  mechanical heart valve.  Present Coumadin  dose: 2mg  Goal: 2.5-3.5  Excessive bruising: no Nose bleeding: no Rectal bleeding: no Prolonged menstrual cycles: N/A Eating diet with consistent amounts of foods containing Vitamin K:yes Any recent antibiotic use? no  Relevant past medical, surgical, family and social history reviewed and updated as indicated. Interim medical history since our last visit reviewed. Allergies and medications reviewed and updated.  Review of Systems  Constitutional: Negative.   Respiratory: Negative.    Cardiovascular: Negative.   Musculoskeletal: Negative.   Neurological: Negative.   Psychiatric/Behavioral: Negative.      Per HPI unless specifically indicated above     Objective:    BP (!) 86/61   Pulse 83   Temp (!) 97.5 F (36.4 C) (Oral)   Ht 5' 6 (1.676 m)   Wt 251 lb 6.4 oz (114 kg)   SpO2 90%   BMI 40.58 kg/m   Wt Readings from Last 3 Encounters:  02/29/24 251 lb 6.4 oz (114 kg)  02/27/24 253 lb (114.8 kg)  02/27/24 251 lb 12.8 oz (114.2 kg)    Physical Exam Vitals and nursing note reviewed.  Constitutional:      General: He is not in acute distress.    Appearance: Normal appearance. He is well-developed. He is obese.  HENT:     Head: Normocephalic and atraumatic.     Right Ear: Hearing and external ear normal.     Left Ear: Hearing and external ear normal.     Nose: Nose normal.     Mouth/Throat:     Mouth: Mucous membranes are moist.     Pharynx: Oropharynx is clear.  Eyes:      General: Lids are normal. No scleral icterus.       Right eye: No discharge.        Left eye: No discharge.     Conjunctiva/sclera: Conjunctivae normal.  Pulmonary:     Effort: Pulmonary effort is normal. No respiratory distress.  Musculoskeletal:        General: Normal range of motion.  Skin:    Coloration: Skin is not jaundiced or pale.     Findings: No bruising, erythema, lesion or rash.  Neurological:     Mental Status: He is alert. Mental status is at baseline. He is disoriented.  Psychiatric:        Mood and Affect: Mood normal.        Speech: Speech normal.        Behavior: Behavior normal.        Thought Content: Thought content normal.        Judgment: Judgment normal.     Last INR: 2.3  Results for orders placed or performed in visit on 02/27/24  CoaguChek XS/INR Waived   Collection Time: 02/27/24  8:27 AM  Result Value Ref Range   INR 3.7 (H) 0.9 - 1.1   Prothrombin  Time 44.3 sec   *Note: Due to a large number of results and/or encounters for the requested time period, some results have not been displayed. A complete set of results can be found in Results Review.      Assessment & Plan:   Problem List Items Addressed This Visit       Other   H/O mechanical aortic valve replacement - Primary   Improved with INR 2.3- but no longer therapeutic. Continue 2mg  and recheck on Tuesday      Relevant Orders   CoaguChek XS/INR Waived     Follow up plan: Return Tuesday.

## 2024-02-29 NOTE — Progress Notes (Signed)
 02/29/2024 Name: Roberto [name] MRN: [id number] DOB: July 17, 1955  Chief Complaint  Patient presents with   Medication Assistance   Roberto [name] is a 68 y.o. year old male who presented for a telephone visit.   They were referred to the pharmacist by their PCP for assistance in managing complex medication management.   Subjective:  Care Team: Primary Care Provider: [name], DO ; Next Scheduled Visit: 9/2 Cardiologist: Roberto [name]; Next Scheduled Visit: 9/17 Pulmonologist:  Dr. Tamea; Next Scheduled Visit:  9/26  Medication Access/Adherence  Current Pharmacy:  [facility] [address] ([address]), [place] - [address] [place]THE[place]ACO[place] [name]) [place] [address] Phone: [phone] Fax: [phone]  [name] [address] [place] [address] Phone: [phone] Fax: [phone]  -Patient reports affordability concerns with their medications: No  -Patient reports access/transportation concerns to their pharmacy: No  -Patient reports adherence concerns with their medications:  No    Diabetes: Current medications: ozempic  1mg  weekly, metformin  xr 500mg  daily, Farxiga  10mg  daily -Patient has home testing supplies but does not monitor home BG -Well controlled with last A1c of 6.1% -Does not endorse s/sx of hypoglycemia or hyperglycemia  Hyperlipidemia/ASCVD Risk Reduction Current lipid lowering medications: rosuvastatin  40mg  daily  Antiplatelet regimen: ASA recently discontinued -Followed by cardiology  Heart Failure Current medications:  ACEi/ARB/ARNI: recently stopped due to hypotension SGLT2i: Farxiga  10mg  daily  Beta blocker: metoprolol  xl 200mg  daily Mineralocorticoid Receptor Antagonist: spironolactone  12.5mg  daily  Diuretic regimen: torsemide  20mg  daily -Entresto was recently stopped by cardiology due to hypotension -BP today at PCP visit was 86/61 -Patient has automated and manual  home BP monitor, and friend Roberto [name]) will be monitoring and recording values daily  Atrial Fibrillation: Current medications: Rate Control: metoprolol  xl 200mg  daily Rhythm Control: amiodarone  400mg  daily Anticoagulation Regimen: warfarin 2mg  daily -INR today of 2.3 -DOAC contraindicated with mechanical valve; friend, Roberto [name], states he was using Eliquis but this was stopped 2-3 weeks ago.  No pharmacy fill history of Eliquis, but she believes this had been given by [facility] provider. -Patient has a pacemaker  Medication Management: Current adherence strategy: Alyse Roberto [name], assists with getting medications from pharmacy as well as organization and administration -Patient reports Good adherence to medications -No barriers to adherence identified; patient has met deductible on insurance, so medications are currently no cost to him  Objective:  Lab Results  Component Value Date   HGBA1C 6.1 (H) 09/11/2023   Lab Results  Component Value Date   CREATININE 2.09 (H) 02/25/2024   BUN 29 (H) 02/25/2024   NA 138 02/25/2024   K 3.9 02/25/2024   CL 98 02/25/2024   CO2 23 02/25/2024   Lab Results  Component Value Date   CHOL 145 09/11/2023   HDL 69 09/11/2023   LDLCALC 47 09/11/2023   LDLDIRECT 96.4 06/04/2019   TRIG 177 (H) 09/11/2023   CHOLHDL 4.3 08/08/2019   Assessment/Plan:   Diabetes: -Currently controlled -Continue current regimen at this time -I recommend a follow-up A1c in September -If eGFR falls below 30, I recommend stopping metformin   Hyperlipidemia/ASCVD Risk Reduction: -Currently controlled.  -Continue current regimen and regular follow-up with PCP and cardiology  Heart Failure: -Currently appropriately managed -Monitor and record home BP daily and bring to upcoming provider visits -Continue current regimen and regular follow-up with PCP and cardiology  Atrial Fibrillation: -Currently controlled -Monitor and record home BP daily and bring to upcoming provider  visits -Continue current regimen and regular  follow-up with PCP and cardiology  Medication Management: -Currently strategy sufficient to maintain appropriate adherence to prescribed medication regimen -Medication review completed, and medication list on file is up to date and accurate with what patient is currently taking and how -No drug interactions identified, but based on eGFR, I would recommend the following:  decreasing allopurinol  to 50mg  daily   Follow Up Plan: Provided my direct number to Roberto [name], so I can be reached with any medication questions or concerns  Roberto [name], PharmD, DPLA

## 2024-03-04 ENCOUNTER — Ambulatory Visit (INDEPENDENT_AMBULATORY_CARE_PROVIDER_SITE_OTHER): Admitting: Family Medicine

## 2024-03-04 ENCOUNTER — Encounter: Payer: Self-pay | Admitting: Family Medicine

## 2024-03-04 VITALS — BP 85/64 | HR 80 | Ht 66.0 in | Wt 244.0 lb

## 2024-03-04 DIAGNOSIS — E1142 Type 2 diabetes mellitus with diabetic polyneuropathy: Secondary | ICD-10-CM | POA: Diagnosis not present

## 2024-03-04 DIAGNOSIS — R1013 Epigastric pain: Secondary | ICD-10-CM | POA: Diagnosis not present

## 2024-03-04 DIAGNOSIS — E1159 Type 2 diabetes mellitus with other circulatory complications: Secondary | ICD-10-CM

## 2024-03-04 DIAGNOSIS — R6881 Early satiety: Secondary | ICD-10-CM | POA: Diagnosis not present

## 2024-03-04 DIAGNOSIS — Z952 Presence of prosthetic heart valve: Secondary | ICD-10-CM

## 2024-03-04 DIAGNOSIS — R634 Abnormal weight loss: Secondary | ICD-10-CM

## 2024-03-04 DIAGNOSIS — E861 Hypovolemia: Secondary | ICD-10-CM | POA: Diagnosis not present

## 2024-03-04 LAB — BAYER DCA HB A1C WAIVED: HB A1C (BAYER DCA - WAIVED): 5.8 % — ABNORMAL HIGH (ref 4.8–5.6)

## 2024-03-04 LAB — COAGUCHEK XS/INR WAIVED
INR: 3 — ABNORMAL HIGH (ref 0.9–1.1)
Prothrombin Time: 35.5 s

## 2024-03-04 MED ORDER — MIDODRINE HCL 2.5 MG PO TABS: 2.5000 mg | ORAL_TABLET | Freq: Two times a day (BID) | ORAL | 3 refills | Status: DC
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
## 2024-03-04 NOTE — Assessment & Plan Note (Signed)
 Rechecking labs today. Await results. Treat as needed.

## 2024-03-04 NOTE — Addendum Note (Signed)
 Addended by: [name] on: 03/04/2024 04:58 PM   Modules accepted: Level of Service

## 2024-03-04 NOTE — Assessment & Plan Note (Signed)
 Still running low after having medications decreased by cardiology. Will start midodrine . Recheck 1 week.

## 2024-03-04 NOTE — Assessment & Plan Note (Signed)
 Doing well with INR of 3.0- will continue 2mg  and recheck 1 week. Call with any concerns.

## 2024-03-05 ENCOUNTER — Encounter: Payer: Self-pay | Admitting: Pulmonary Disease

## 2024-03-05 ENCOUNTER — Ambulatory Visit
Admission: RE | Admit: 2024-03-05 | Discharge: 2024-03-05 | Disposition: A | Discharge status: Home / Self Care | Source: Ambulatory Visit | Attending: Family Medicine | Admitting: Family Medicine

## 2024-03-05 DIAGNOSIS — R1030 Lower abdominal pain, unspecified: Secondary | ICD-10-CM | POA: Diagnosis not present

## 2024-03-05 DIAGNOSIS — R634 Abnormal weight loss: Secondary | ICD-10-CM | POA: Diagnosis not present

## 2024-03-05 DIAGNOSIS — R6881 Early satiety: Secondary | ICD-10-CM | POA: Insufficient documentation

## 2024-03-05 DIAGNOSIS — K7689 Other specified diseases of liver: Secondary | ICD-10-CM | POA: Diagnosis not present

## 2024-03-05 LAB — CBC WITH DIFFERENTIAL/PLATELET
Basophils Absolute: 0.1 x10E3/uL (ref 0.0–0.2)
Basos: 1 %
EOS (ABSOLUTE): 0.1 x10E3/uL (ref 0.0–0.4)
Eos: 1 %
Hematocrit: 50.9 % (ref 37.5–51.0)
Hemoglobin: 15.8 g/dL (ref 13.0–17.7)
Immature Grans (Abs): 0.1 x10E3/uL (ref 0.0–0.1)
Immature Granulocytes: 1 %
Lymphocytes Absolute: 1.5 x10E3/uL (ref 0.7–3.1)
Lymphs: 10 %
MCH: 28.7 pg (ref 26.6–33.0)
MCHC: 31 g/dL — ABNORMAL LOW (ref 31.5–35.7)
MCV: 92 fL (ref 79–97)
Monocytes Absolute: 1.7 x10E3/uL — ABNORMAL HIGH (ref 0.1–0.9)
Monocytes: 11 %
Neutrophils Absolute: 11.7 x10E3/uL — ABNORMAL HIGH (ref 1.4–7.0)
Neutrophils: 76 %
Platelets: 267 x10E3/uL (ref 150–450)
RBC: 5.51 x10E6/uL (ref 4.14–5.80)
RDW: 14.2 % (ref 11.6–15.4)
WBC: 15.3 x10E3/uL — ABNORMAL HIGH (ref 3.4–10.8)

## 2024-03-05 LAB — COMPREHENSIVE METABOLIC PANEL WITH GFR
ALT: 61 IU/L — ABNORMAL HIGH (ref 0–44)
AST: 57 IU/L — ABNORMAL HIGH (ref 0–40)
Albumin: 4.5 g/dL (ref 3.9–4.9)
Alkaline Phosphatase: 78 IU/L (ref 44–121)
BUN/Creatinine Ratio: 12 (ref 10–24)
BUN: 33 mg/dL — ABNORMAL HIGH (ref 8–27)
Bilirubin Total: 0.4 mg/dL (ref 0.0–1.2)
CO2: 24 mmol/L (ref 20–29)
Calcium: 10.3 mg/dL — ABNORMAL HIGH (ref 8.6–10.2)
Chloride: 100 mmol/L (ref 96–106)
Creatinine, Ser: 2.85 mg/dL — ABNORMAL HIGH (ref 0.76–1.27)
Globulin, Total: 2.8 g/dL (ref 1.5–4.5)
Glucose: 93 mg/dL (ref 70–99)
Potassium: 4.2 mmol/L (ref 3.5–5.2)
Sodium: 142 mmol/L (ref 134–144)
Total Protein: 7.3 g/dL (ref 6.0–8.5)
eGFR: 23 mL/min/1.73 — ABNORMAL LOW (ref >59)

## 2024-03-05 LAB — LIPASE: Lipase: 60 U/L (ref 13–78)

## 2024-03-05 LAB — AMYLASE: Amylase: 156 U/L — ABNORMAL HIGH (ref 31–110)

## 2024-03-06 ENCOUNTER — Encounter: Payer: Self-pay | Admitting: Family Medicine

## 2024-03-06 NOTE — Telephone Encounter (Signed)
 Roberto [name] with [employer] stated they had sent text on 02/26/24 02/27/24 with no response. She is going to call him

## 2024-03-06 NOTE — Telephone Encounter (Signed)
 The patient has the contact information to call. I have left a message asking Delon with [employer] to call me about this patient to see if he has attempted to the registration

## 2024-03-07 ENCOUNTER — Emergency Department (HOSPITAL_COMMUNITY)

## 2024-03-07 ENCOUNTER — Inpatient Hospital Stay (HOSPITAL_COMMUNITY)
Admission: EM | Admit: 2024-03-07 | Discharge: 2024-03-09 | DRG: 641 | Disposition: A | Discharge status: Home / Self Care | Attending: Family Medicine | Admitting: Family Medicine

## 2024-03-07 ENCOUNTER — Encounter (HOSPITAL_COMMUNITY): Payer: Self-pay

## 2024-03-07 ENCOUNTER — Other Ambulatory Visit: Payer: Self-pay

## 2024-03-07 ENCOUNTER — Ambulatory Visit: Payer: Self-pay

## 2024-03-07 ENCOUNTER — Telehealth: Payer: Self-pay

## 2024-03-07 DIAGNOSIS — I5022 Chronic systolic (congestive) heart failure: Secondary | ICD-10-CM | POA: Diagnosis present

## 2024-03-07 DIAGNOSIS — R1011 Right upper quadrant pain: Secondary | ICD-10-CM | POA: Diagnosis not present

## 2024-03-07 DIAGNOSIS — R634 Abnormal weight loss: Secondary | ICD-10-CM | POA: Diagnosis not present

## 2024-03-07 DIAGNOSIS — N1832 Chronic kidney disease, stage 3b: Secondary | ICD-10-CM | POA: Diagnosis present

## 2024-03-07 DIAGNOSIS — I2510 Atherosclerotic heart disease of native coronary artery without angina pectoris: Secondary | ICD-10-CM | POA: Diagnosis present

## 2024-03-07 DIAGNOSIS — Z800 Family history of malignant neoplasm of digestive organs: Secondary | ICD-10-CM

## 2024-03-07 DIAGNOSIS — Z95810 Presence of automatic (implantable) cardiac defibrillator: Secondary | ICD-10-CM

## 2024-03-07 DIAGNOSIS — Z823 Family history of stroke: Secondary | ICD-10-CM

## 2024-03-07 DIAGNOSIS — Z8261 Family history of arthritis: Secondary | ICD-10-CM

## 2024-03-07 DIAGNOSIS — E7800 Pure hypercholesterolemia, unspecified: Secondary | ICD-10-CM | POA: Diagnosis present

## 2024-03-07 DIAGNOSIS — Z833 Family history of diabetes mellitus: Secondary | ICD-10-CM | POA: Diagnosis not present

## 2024-03-07 DIAGNOSIS — E669 Obesity, unspecified: Secondary | ICD-10-CM | POA: Diagnosis present

## 2024-03-07 DIAGNOSIS — Z6839 Body mass index (BMI) 39.0-39.9, adult: Secondary | ICD-10-CM

## 2024-03-07 DIAGNOSIS — Z803 Family history of malignant neoplasm of breast: Secondary | ICD-10-CM

## 2024-03-07 DIAGNOSIS — J449 Chronic obstructive pulmonary disease, unspecified: Secondary | ICD-10-CM | POA: Diagnosis not present

## 2024-03-07 DIAGNOSIS — Z7984 Long term (current) use of oral hypoglycemic drugs: Secondary | ICD-10-CM | POA: Diagnosis not present

## 2024-03-07 DIAGNOSIS — N182 Chronic kidney disease, stage 2 (mild): Secondary | ICD-10-CM

## 2024-03-07 DIAGNOSIS — Z1152 Encounter for screening for COVID-19: Secondary | ICD-10-CM

## 2024-03-07 DIAGNOSIS — N179 Acute kidney failure, unspecified: Secondary | ICD-10-CM | POA: Diagnosis not present

## 2024-03-07 DIAGNOSIS — E1122 Type 2 diabetes mellitus with diabetic chronic kidney disease: Secondary | ICD-10-CM | POA: Diagnosis present

## 2024-03-07 DIAGNOSIS — Z7901 Long term (current) use of anticoagulants: Secondary | ICD-10-CM | POA: Diagnosis not present

## 2024-03-07 DIAGNOSIS — Z7985 Long-term (current) use of injectable non-insulin antidiabetic drugs: Secondary | ICD-10-CM | POA: Diagnosis not present

## 2024-03-07 DIAGNOSIS — F4310 Post-traumatic stress disorder, unspecified: Secondary | ICD-10-CM | POA: Diagnosis not present

## 2024-03-07 DIAGNOSIS — G4733 Obstructive sleep apnea (adult) (pediatric): Secondary | ICD-10-CM | POA: Diagnosis present

## 2024-03-07 DIAGNOSIS — T383X5A Adverse effect of insulin and oral hypoglycemic [antidiabetic] drugs, initial encounter: Secondary | ICD-10-CM | POA: Diagnosis present

## 2024-03-07 DIAGNOSIS — Z79899 Other long term (current) drug therapy: Secondary | ICD-10-CM | POA: Diagnosis not present

## 2024-03-07 DIAGNOSIS — I4891 Unspecified atrial fibrillation: Secondary | ICD-10-CM | POA: Diagnosis present

## 2024-03-07 DIAGNOSIS — Z8673 Personal history of transient ischemic attack (TIA), and cerebral infarction without residual deficits: Secondary | ICD-10-CM

## 2024-03-07 DIAGNOSIS — I130 Hypertensive heart and chronic kidney disease with heart failure and stage 1 through stage 4 chronic kidney disease, or unspecified chronic kidney disease: Secondary | ICD-10-CM | POA: Diagnosis not present

## 2024-03-07 DIAGNOSIS — K769 Liver disease, unspecified: Secondary | ICD-10-CM | POA: Diagnosis not present

## 2024-03-07 DIAGNOSIS — I959 Hypotension, unspecified: Secondary | ICD-10-CM

## 2024-03-07 DIAGNOSIS — N189 Chronic kidney disease, unspecified: Secondary | ICD-10-CM | POA: Diagnosis not present

## 2024-03-07 DIAGNOSIS — Z808 Family history of malignant neoplasm of other organs or systems: Secondary | ICD-10-CM

## 2024-03-07 DIAGNOSIS — I9589 Other hypotension: Secondary | ICD-10-CM | POA: Diagnosis present

## 2024-03-07 DIAGNOSIS — I482 Chronic atrial fibrillation, unspecified: Secondary | ICD-10-CM

## 2024-03-07 DIAGNOSIS — Z888 Allergy status to other drugs, medicaments and biological substances status: Secondary | ICD-10-CM

## 2024-03-07 DIAGNOSIS — Z8249 Family history of ischemic heart disease and other diseases of the circulatory system: Secondary | ICD-10-CM

## 2024-03-07 DIAGNOSIS — N329 Bladder disorder, unspecified: Secondary | ICD-10-CM | POA: Diagnosis present

## 2024-03-07 DIAGNOSIS — R112 Nausea with vomiting, unspecified: Secondary | ICD-10-CM | POA: Diagnosis present

## 2024-03-07 DIAGNOSIS — K7689 Other specified diseases of liver: Secondary | ICD-10-CM | POA: Diagnosis not present

## 2024-03-07 DIAGNOSIS — R627 Adult failure to thrive: Secondary | ICD-10-CM | POA: Diagnosis not present

## 2024-03-07 DIAGNOSIS — Y92009 Unspecified place in unspecified non-institutional (private) residence as the place of occurrence of the external cause: Secondary | ICD-10-CM | POA: Diagnosis not present

## 2024-03-07 DIAGNOSIS — E860 Dehydration: Principal | ICD-10-CM | POA: Diagnosis present

## 2024-03-07 DIAGNOSIS — F17210 Nicotine dependence, cigarettes, uncomplicated: Secondary | ICD-10-CM | POA: Diagnosis not present

## 2024-03-07 DIAGNOSIS — H9190 Unspecified hearing loss, unspecified ear: Secondary | ICD-10-CM | POA: Diagnosis present

## 2024-03-07 DIAGNOSIS — Z952 Presence of prosthetic heart valve: Secondary | ICD-10-CM

## 2024-03-07 DIAGNOSIS — I129 Hypertensive chronic kidney disease with stage 1 through stage 4 chronic kidney disease, or unspecified chronic kidney disease: Secondary | ICD-10-CM | POA: Diagnosis not present

## 2024-03-07 DIAGNOSIS — E1142 Type 2 diabetes mellitus with diabetic polyneuropathy: Secondary | ICD-10-CM | POA: Diagnosis present

## 2024-03-07 LAB — URINALYSIS, ROUTINE W REFLEX MICROSCOPIC
Bilirubin Urine: NEGATIVE
Glucose, UA: >=500 mg/dL — AB
Hgb urine dipstick: NEGATIVE
Ketones, ur: NEGATIVE mg/dL
Leukocytes,Ua: NEGATIVE
Nitrite: NEGATIVE
Protein, ur: NEGATIVE mg/dL
Specific Gravity, Urine: 1.007 (ref 1.005–1.030)
pH: 6 (ref 5.0–8.0)

## 2024-03-07 LAB — COMPREHENSIVE METABOLIC PANEL WITH GFR
ALT: 58 U/L — ABNORMAL HIGH (ref 0–44)
AST: 63 U/L — ABNORMAL HIGH (ref 15–41)
Albumin: 3.7 g/dL (ref 3.5–5.0)
Alkaline Phosphatase: 59 U/L (ref 38–126)
Anion gap: 16 — ABNORMAL HIGH (ref 5–15)
BUN: 35 mg/dL — ABNORMAL HIGH (ref 8–23)
CO2: 20 mmol/L — ABNORMAL LOW (ref 22–32)
Calcium: 9.8 mg/dL (ref 8.9–10.3)
Chloride: 104 mmol/L (ref 98–111)
Creatinine, Ser: 2.49 mg/dL — ABNORMAL HIGH (ref 0.61–1.24)
GFR, Estimated: 27 mL/min — ABNORMAL LOW (ref >60)
Glucose, Bld: 142 mg/dL — ABNORMAL HIGH (ref 70–99)
Potassium: 3.5 mmol/L (ref 3.5–5.1)
Sodium: 140 mmol/L (ref 135–145)
Total Bilirubin: 0.8 mg/dL (ref 0.0–1.2)
Total Protein: 7.7 g/dL (ref 6.5–8.1)

## 2024-03-07 LAB — CBC
HCT: 48.8 % (ref 39.0–52.0)
Hemoglobin: 15.5 g/dL (ref 13.0–17.0)
MCH: 28.9 pg (ref 26.0–34.0)
MCHC: 31.8 g/dL (ref 30.0–36.0)
MCV: 90.9 fL (ref 80.0–100.0)
Platelets: 254 K/uL (ref 150–400)
RBC: 5.37 MIL/uL (ref 4.22–5.81)
RDW: 15.5 % (ref 11.5–15.5)
WBC: 11.1 K/uL — ABNORMAL HIGH (ref 4.0–10.5)
nRBC: 0 % (ref 0.0–0.2)

## 2024-03-07 LAB — FERRITIN: Ferritin: 143 ng/mL (ref 24–336)

## 2024-03-07 LAB — PROTIME-INR
INR: 2.3 — ABNORMAL HIGH (ref 0.8–1.2)
Prothrombin Time: 26.4 s — ABNORMAL HIGH (ref 11.4–15.2)

## 2024-03-07 LAB — TRANSFERRIN: Transferrin: 257 mg/dL (ref 180–329)

## 2024-03-07 LAB — T4, FREE: Free T4: 1.02 ng/dL (ref 0.61–1.12)

## 2024-03-07 LAB — TSH: TSH: 3.751 u[IU]/mL (ref 0.350–4.500)

## 2024-03-07 LAB — LIPASE, BLOOD: Lipase: 51 U/L (ref 11–51)

## 2024-03-07 MED ORDER — SODIUM CHLORIDE 0.9 % IV BOLUS
1000.0000 mL | Freq: Once | INTRAVENOUS | Status: AC
  Administered 2024-03-07: 1000 mL via INTRAVENOUS

## 2024-03-07 MED ORDER — ENOXAPARIN SODIUM 40 MG/0.4ML IJ SOSY: 40.0000 mg | PREFILLED_SYRINGE | INTRAMUSCULAR | Status: DC

## 2024-03-07 MED ORDER — POTASSIUM CHLORIDE 10 MEQ/100ML IV SOLN
10.0000 meq | INTRAVENOUS | Status: AC
  Administered 2024-03-07 – 2024-03-08 (×3): 10 meq via INTRAVENOUS
  Filled 2024-03-07 (×3): qty 100

## 2024-03-07 MED ORDER — ONDANSETRON HCL 4 MG PO TABS: 4.0000 mg | ORAL_TABLET | Freq: Four times a day (QID) | ORAL | Status: DC | PRN

## 2024-03-07 MED ORDER — ONDANSETRON HCL 4 MG/2ML IJ SOLN
4.0000 mg | Freq: Once | INTRAMUSCULAR | Status: AC
  Administered 2024-03-07: 4 mg via INTRAVENOUS
  Filled 2024-03-07: qty 2

## 2024-03-07 MED ORDER — BISACODYL 5 MG PO TBEC: 5.0000 mg | DELAYED_RELEASE_TABLET | Freq: Every day | ORAL | Status: DC | PRN

## 2024-03-07 MED ORDER — ENSURE PLUS HIGH PROTEIN PO LIQD
237.0000 mL | Freq: Two times a day (BID) | ORAL | Status: DC
  Administered 2024-03-08 (×2): 237 mL via ORAL

## 2024-03-07 MED ORDER — LACTATED RINGERS IV SOLN: INTRAVENOUS | Status: DC

## 2024-03-07 MED ORDER — ONDANSETRON HCL 4 MG/2ML IJ SOLN: 4.0000 mg | Freq: Four times a day (QID) | INTRAMUSCULAR | Status: DC | PRN

## 2024-03-07 MED ORDER — ACETAMINOPHEN 325 MG PO TABS: 650.0000 mg | ORAL_TABLET | Freq: Four times a day (QID) | ORAL | Status: DC | PRN

## 2024-03-07 MED ORDER — ACETAMINOPHEN 650 MG RE SUPP: 650.0000 mg | Freq: Four times a day (QID) | RECTAL | Status: DC | PRN

## 2024-03-07 MED ORDER — SODIUM CHLORIDE 0.9 % IV SOLN: INTRAVENOUS | Status: DC

## 2024-03-07 NOTE — ED Notes (Signed)
 I informed the nurse of patient low pressure  she is aware

## 2024-03-07 NOTE — ED Notes (Signed)
 Patient transported to Ultrasound

## 2024-03-07 NOTE — Telephone Encounter (Signed)
 Results not yet reviewed by provider. Once they have been patient will be contacted. Patient to likely be contacted early next week with the results.

## 2024-03-07 NOTE — ED Notes (Signed)
 Main lab called to add on extra labs.

## 2024-03-07 NOTE — ED Provider Notes (Signed)
  Provider Note MRN:  [id number]  Arrival date & time: 03/07/24    ED Course and Medical Decision Making  Assumed care from Dr Francesca at shift change.  See note from prior team for complete details, in brief:  Clinical Course as of 03/07/24 1849  Fri Mar 07, 2024  1509 Handoff WS 68 yo/m  Here w/ abd pain, n/v, ppm, mechanical valve on coumadin  Poor PO, only drinking milkshakes over last 6 wks Cr elev, acute on chronic aki CTAP recent neg RUQ u/s pending Chronic hypotension He is on ozempic , this should be d/c'd Anticipate admission  [SG]  1533 Signed out to Dr. Elnor pending RUQ us , re-assessment. [WS]  1629 U/s resulted, concern intrinsic liver disease - ?hemochromatosis, ?renal neoplasm [SG]    Clinical Course User Index [SG] Elnor Jayson LABOR, DO [WS] Francesca Elsie CROME, MD   Possible hemochromatosis noted on o/p CT and US  today, hgb here is wnl, does not receive regular blood transfusions. Will order ferritin/transferrin   O/p CT also concerning for bronchiolitis, he denies and sig URI type symptoms   He has been hypotensive during multiple encounters over the last month, not acutely worse today   Patient reassessed, he is chronically hypotensive, this is not acutely changed in the ER.  He was given 1 L of fluid.  He has history of HFrEF, LVEF 28% per echo 3/25.  Will put him on a judicious fluid infusion for his AKI.  He has been rapidly losing weight, poor p.o. intake, he is on furosemide  and torsemide  and Ozempic .  He is likely over diuresed with poor p.o. intake.  Recommend observation for rehydration, medication management, failure to thrive.  Would likely benefit from swallow study.  Patient / family agreeable with plan.  Will admit to hospitalist  .Critical Care  Performed by: Elnor Jayson LABOR, DO Authorized by: Elnor Jayson LABOR, DO   Critical care provider statement:    Critical care time (minutes):  30   Critical care time was exclusive of:  Separately billable  procedures and treating other patients   Critical care was necessary to treat or prevent imminent or life-threatening deterioration of the following conditions:  Dehydration   Critical care was time spent personally by me on the following activities:  Development of treatment plan with patient or surrogate, discussions with consultants, evaluation of patient's response to treatment, examination of patient, ordering and review of laboratory studies, ordering and review of radiographic studies, ordering and performing treatments and interventions, pulse oximetry, re-evaluation of patient's condition, review of old charts and obtaining history from patient or surrogate   Care discussed with: admitting provider     Final Clinical Impressions(s) / ED Diagnoses     ICD-10-CM   1. Acute kidney injury superimposed on chronic kidney disease (HCC)  N17.9    N18.9     2. Nausea and vomiting, unspecified vomiting type  R11.2     3. Lesion of bladder  N32.9     4. Liver disease  K76.9     5. Failure to thrive in adult  R62.7     6. Hypotension, unspecified hypotension type  I95.9       ED Discharge Orders     None       Discharge Instructions   None        Elnor Jayson LABOR, DO 03/07/24 1849

## 2024-03-07 NOTE — H&P (Addendum)
 History and Physical    Patient: Roberto [name] FMW:[id number] DOB: [birth year] DOA: 03/07/2024 DOS: the patient was seen and examined on 03/07/2024 PCP: [name], DO  Patient coming from: Home  Chief Complaint:  Chief Complaint  Patient presents with   Abdominal Pain   Emesis   HPI: Roberto [name] is a 68 y.o. male with medical history significant for nonobstructive CAD, CHF with EF of 35%, AICD placement, OSA, aortic valve replacement and atrial fibrillation on Coumadin , type 2 diabetes mellitus with peripheral neuropathy who presents with 3 weeks of  not being able to eat anything.  He has terrible dry heaves when he tries to eat.  He isnt really vomiting. He can only keep down sips of water , milkshakes, and some bites of eggs. He has lost his sense of taste. He believes he has lost at least 12 pounds in the last week. He was hospitalized at [facility] from August 2 through February 07, 2024 where he had an AICD upgrade.  He was discharged on continued mexiletine and higher doses of amiodarone .  He is maintained on torsemide  twice daily, spironolactone  25 mg daily Entresto.   Toprol  XL was started at the time of discharge while Lisinopril  and losartan  were discontinued.  He was started on Ozempic  approximately 6 months ago. Midodrine  was started 5 days ago by his PCP for a systolic blood pressure of 79 in the office.   Review of Systems: As mentioned in the history of present illness. All other systems reviewed and are negative. Past Medical History:  Diagnosis Date   Aneurysm (HCC)    Bicuspid aortic valve    a. s/p #27 Carbomed[employer] mechanical valve on 03/25/2010; b. on Coumadin ; c. TTE 12/17: EF 40-45%, moderately dilated LV with moderate LVH, AVR well-seated with 14 mmHg gradient, peak AV velocity 2.5 m/s, mild mitral valve thickening with mild MR, mildly dilated RV with mildly reduced contraction   Cellulitis    CHF (congestive heart failure) (HCC)    Chronic kidney disease     Chronic systolic CHF (congestive heart failure) (HCC)    a. R/LHC 03/2010 showed no significant CAD, LVEDP 31 mmHg, mean AoV gradient 34 mmHg at rest and 47 mmHg with dobutamine 20 mcg/kg/min, AVA 1.0 cm^2, RA 31, RV 68/25, PA 68/47, PCWP 38. PA sat 65%. CO 6.2 L/min (Fick) and 5.3 L/min (thermodilution)   Clotting disorder (HCC)    COPD (chronic obstructive pulmonary disease) (HCC)    H/O mechanical aortic valve replacement 03/25/2010   a. #27 Carbomedics mechanical valve   Hearing loss    High cholesterol    HTN (hypertension)    Hypercholesterolemia    Renal infarct [facility]) 2017   Multiple right renal infarcts, likely embolic.   Stage 3 chronic kidney disease (HCC)    Stroke [facility])    TIA (transient ischemic attack) 05/2014   Past Surgical History:  Procedure Laterality Date   AORTIC VALVE REPLACEMENT     AORTIC VALVE SURGERY     CARDIAC CATHETERIZATION  03/21/2010   No significant CAD. Severe aortic stenosis. Severely elevated left and right heart filling pressures.   CARDIAC SURGERY  2009   CHF   CARDIOVERSION N/A 08/28/2023   Procedure: CARDIOVERSION;  Surgeon: [name] Slain, MD;  Location: [facility] INVASIVE CV LAB;  Service: Cardiovascular;  Laterality: N/A;   CARPAL TUNNEL RELEASE Left 2005   COLONOSCOPY N/A 12/13/2023   Procedure: COLONOSCOPY;  Surgeon: Suzann Inocente HERO, MD;  Location: [facility] ENDOSCOPY;  Service: Gastroenterology;  Laterality:  N/A;   ICD IMPLANT N/A 06/25/2023   Procedure: ICD IMPLANT;  Surgeon: Waddell Danelle ORN, MD;  Location: [facility] INVASIVE CV LAB;  Service: Cardiovascular;  Laterality: N/A;   PACEMAKER IMPLANT     POLYPECTOMY  12/13/2023   Procedure: POLYPECTOMY, INTESTINE;  Surgeon: Suzann Inocente HERO, MD;  Location: [facility] ENDOSCOPY;  Service: Gastroenterology;;   RIGHT HEART CATH AND CORONARY ANGIOGRAPHY N/A 01/28/2019   Procedure: RIGHT HEART CATH AND CORONARY ANGIOGRAPHY;  Surgeon: Mady Bruckner, MD;  Location: [facility] INVASIVE CV LAB;  Service: Cardiovascular;   Laterality: N/A;   RIGHT HEART CATH AND CORONARY ANGIOGRAPHY N/A 06/20/2023   Procedure: RIGHT HEART CATH AND CORONARY ANGIOGRAPHY;  Surgeon: Mady Bruckner, MD;  Location: [facility] INVASIVE CV LAB;  Service: Cardiovascular;  Laterality: N/A;   TONSILLECTOMY  1962   Social History:  reports that he has been smoking cigarettes. He started smoking about 51 years ago. He has a 23.7 pack-year smoking history. He has never used smokeless tobacco. He reports that he does not drink alcohol and does not use drugs.  Allergies  Allergen Reactions   Nicotine      Patches caused localized rash and hives     Family History  Problem Relation Age of Onset   Arthritis Mother    Dementia Mother    Colon cancer Mother    Arthritis Father    Diabetes Father    Stroke Father    Colon cancer Father    Heart attack Brother    Breast cancer Sister    Seizures Sister    Cancer Brother        brain   Heart disease Brother    Heart attack Brother     Prior to Admission medications   Medication Sig Start Date End Date Taking? Authorizing Provider  albuterol  (PROVENTIL ) (2.5 MG/3ML) 0.083% nebulizer solution USE 1 VIAL IN NEBULIZER EVERY 6 HOURS AS NEEDED FOR WHEEZING FOR SHORTNESS OF BREATH 09/25/23   Vicci Bouchard P, DO  allopurinol  (ZYLOPRIM ) 300 MG tablet Take 1 tablet by mouth once daily 12/03/23   Vicci, [name], DO  amiodarone  (PACERONE ) 400 MG tablet Take 1 tablet (400 mg total) by mouth Three (3) times a day with a meal for 5 days, THEN 1 tablet (400 mg total) daily. Patient taking differently: Take 400 mg by mouth daily. 02/07/24 05/12/24  [provider]  baclofen  (LIORESAL ) 10 MG tablet Take 0.5-1 tablets (5-10 mg total) by mouth at bedtime as needed. 02/25/24   Vicci Bouchard P, DO  buPROPion  (WELLBUTRIN  XL) 300 MG 24 hr tablet Take 300 mg by mouth daily. 08/16/23   [provider]  dapagliflozin  propanediol (FARXIGA ) 10 MG TABS tablet Take 1 tablet (10 mg total) by mouth daily  before breakfast. 07/05/23   Donette Ellouise LABOR, FNP  Fluticasone -Umeclidin-Vilant (TRELEGY ELLIPTA ) 200-62.5-25 MCG/ACT AEPB Inhale 1 puff into the lungs daily in the afternoon. 02/22/24   [name], MD  metFORMIN  (GLUCOPHAGE -XR) 500 MG 24 hr tablet Take 1 tablet by mouth once daily with breakfast 12/20/23   Vicci Bouchard P, DO  metoprolol  succinate (TOPROL -XL) 100 MG 24 hr tablet Take 200 mg by mouth daily. 02/08/24   [provider]  mexiletine (MEXITIL ) 150 MG capsule Take 150 mg by mouth 3 (three) times daily. 02/07/24   [provider]  midodrine  (PROAMATINE ) 2.5 MG tablet Take 1 tablet (2.5 mg total) by mouth 2 (two) times daily with a meal. 03/04/24   [name], [name], DO  nortriptyline  (PAMELOR )  10 MG capsule Take 1 capsule by mouth at bedtime 01/18/24   [name], [name], DO  Omega-3 Fatty Acids (OMEGA 3 PO) Take 1,500 mg by mouth 2 (two) times daily.    [provider]  potassium chloride  SA (KLOR-CON  M) 20 MEQ tablet Take 20 mEq by mouth daily. 02/27/24   [provider]  rosuvastatin  (CRESTOR ) 40 MG tablet Take 1 tablet (40 mg total) by mouth daily. 04/06/23   End, [name], MD  Semaglutide , 1 MG/DOSE, (OZEMPIC , 1 MG/DOSE,) 4 MG/3ML SOPN INJECT 1 MG UNDER THE SKIN ONCE WEEKLY 01/23/24   Vicci Bouchard P, DO  Spacer/Aero-Holding [facility] Use as directed Dx: COPD, J44.9 07/13/23   [name], [name], DO  spironolactone  (ALDACTONE ) 25 MG tablet Take 0.5 tablets (12.5 mg total) by mouth daily. 02/25/24   [name], [name], DO  torsemide  (DEMADEX ) 20 MG tablet Take 1 tablet (20 mg total) by mouth daily. 02/27/24 05/27/24  Donette Ellouise LABOR, FNP  VENTOLIN  HFA 108 (90 Base) MCG/ACT inhaler INHALE 2 PUFFS BY MOUTH EVERY 6 HOURS AS NEEDED FOR WHEEZING OR SHORTNESS OF BREATH 05/16/23   [name], [name], DO  warfarin (COUMADIN ) 1 MG tablet Take 1 tablet (1 mg total) by mouth daily. Patient taking differently: Take 2 mg by mouth daily. 11/30/23   [name], [name], DO   warfarin (COUMADIN ) 3 MG tablet Take 1 tablet (3 mg total) by mouth daily. Patient not taking: Reported on 03/04/2024 12/26/23   Herold Hadassah SQUIBB, MD    Physical Exam: Vitals:   03/07/24 1715 03/07/24 1730 03/07/24 1927 03/07/24 1945  BP: 90/62  92/60 93/69  Pulse: 80 84 (!) 45 80  Resp: 19  19 17   Temp:      TempSrc:      SpO2: 96%  93% 90%  Weight:      Height:       Physical Exam:  General: No acute distress, obese HEENT: Normocephalic, atraumatic, PERRL Mouth is extremely dry Cardiovascular: Normal rate and rhythm. Distal pulses intact. Pulmonary: Normal pulmonary effort, normal breath sounds Gastrointestinal: Nondistended abdomen, soft, non-tender, hypooactive bowel sounds Musculoskeletal:Normal ROM, no lower ext edema Lymphadenopathy: No cervical LAD. Skin: Skin is warm and dry. Neuro: No focal deficits noted, AAOx3. PSYCH: Attentive and cooperative  Data Reviewed:  Results for orders placed or performed during the hospital encounter of 03/07/24 (from the past 24 hours)  Urinalysis, Routine w reflex microscopic -Urine, Clean Catch     Status: Abnormal   Collection Time: 03/07/24 12:02 PM  Result Value Ref Range   Color, Urine YELLOW YELLOW   APPearance CLEAR CLEAR   Specific Gravity, Urine 1.007 1.005 - 1.030   pH 6.0 5.0 - 8.0   Glucose, UA >=500 (A) NEGATIVE mg/dL   Hgb urine dipstick NEGATIVE NEGATIVE   Bilirubin Urine NEGATIVE NEGATIVE   Ketones, ur NEGATIVE NEGATIVE mg/dL   Protein, ur NEGATIVE NEGATIVE mg/dL   Nitrite NEGATIVE NEGATIVE   Leukocytes,Ua NEGATIVE NEGATIVE   RBC / HPF 0-5 0 - 5 RBC/hpf   WBC, UA 0-5 0 - 5 WBC/hpf   Bacteria, UA RARE (A) NONE SEEN   Squamous Epithelial / HPF 0-5 0 - 5 /HPF   Mucus PRESENT    Hyaline Casts, UA PRESENT   Lipase, blood     Status: None   Collection Time: 03/07/24 12:13 PM  Result Value Ref Range   Lipase 51 11 - 51 U/L  Comprehensive metabolic panel     Status: Abnormal  Collection Time: 03/07/24 12:13 PM   Result Value Ref Range   Sodium 140 135 - 145 mmol/L   Potassium 3.5 3.5 - 5.1 mmol/L   Chloride 104 98 - 111 mmol/L   CO2 20 (L) 22 - 32 mmol/L   Glucose, Bld 142 (H) 70 - 99 mg/dL   BUN 35 (H) 8 - 23 mg/dL   Creatinine, Ser 7.50 (H) 0.61 - 1.24 mg/dL   Calcium  9.8 8.9 - 10.3 mg/dL   Total Protein 7.7 6.5 - 8.1 g/dL   Albumin 3.7 3.5 - 5.0 g/dL   AST 63 (H) 15 - 41 U/L   ALT 58 (H) 0 - 44 U/L   Alkaline Phosphatase 59 38 - 126 U/L   Total Bilirubin 0.8 0.0 - 1.2 mg/dL   GFR, Estimated 27 (L) >60 mL/min   Anion gap 16 (H) 5 - 15  CBC     Status: Abnormal   Collection Time: 03/07/24 12:13 PM  Result Value Ref Range   WBC 11.1 (H) 4.0 - 10.5 K/uL   RBC 5.37 4.22 - 5.81 MIL/uL   Hemoglobin 15.5 13.0 - 17.0 g/dL   HCT 51.1 60.9 - 47.9 %   MCV 90.9 80.0 - 100.0 fL   MCH 28.9 26.0 - 34.0 pg   MCHC 31.8 30.0 - 36.0 g/dL   RDW 84.4 88.4 - 84.4 %   Platelets 254 150 - 400 K/uL   nRBC 0.0 0.0 - 0.2 %  Protime-INR     Status: Abnormal   Collection Time: 03/07/24 12:47 PM  Result Value Ref Range   Prothrombin Time 26.4 (H) 11.4 - 15.2 seconds   INR 2.3 (H) 0.8 - 1.2   *Note: Due to a large number of results and/or encounters for the requested time period, some results have not been displayed. A complete set of results can be found in Results Review.    CT abd/ pelvis  IMPRESSION: 1. Accentuated density in the liver, parenchyma measuring at 106 Hounsfield units on this noncontrast exam, raising the possibility of hemochromatosis. 2. Faint clustered tree-in-bud nodularity in the right lower lobe, favoring atypical infectious bronchiolitis. 3. Mild to moderate cardiomegaly. 4. Dependent density in the gallbladder is indistinct and may be from sludge or gallstones. 5. Bilateral foraminal impingement at L5-S1 due to facet arthropathy, intervertebral spurring, and a suspected nitrogen gas filled disc protrusion in the right neural foramen. 6.  Aortic Atherosclerosis  (ICD10-I70.0).   Assessment and Plan: Unable to eat/unintentional weight loss/ dry mouth/ dry heaves/ everything tastes bad. - Discontinue Ozempic  - Consider GI consultation - Could this be due to one of his medications?  2. H/o VT and AICD placement.  Patient recently had recurrent V. tach and was hospitalized to [facility]. He is status post upgrade of his AICD - stable - Continue mexiletine and amiodarone  - Keep mg=2 and k=4  3. Atrial fib and mechanical aortic valve - Continue Coumadin   4. DM T2  - Hold Ozempic  (Mondays)  - Hold Metformin  - Corrective dose insulin  as needed  5.  Hypotension - metoprolol  was started last month at [facility].  Midodrine  was started 5 days ago. Will discontinue the Metoprolol .   - Monitor  6. CHF EF= 15%  He may be slightly volume depleted He did receive some IV fluids in the emergency department.  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  He does have an AICD in place.  Consults: None  Family Communication: None  Severity of Illness: The appropriate patient status for this patient is INPATIENT. Inpatient status is judged to be reasonable and necessary in order to provide the required intensity of service to ensure the patient's safety. The patient's presenting symptoms, physical exam findings, and initial radiographic and laboratory data in the context of their chronic comorbidities is felt to place them at high risk for further clinical deterioration. Furthermore, it is not anticipated that the patient will be medically stable for discharge from the hospital within 2 midnights of admission.   * I certify that at the point of admission it is my clinical judgment that the patient will require inpatient hospital care spanning beyond 2 midnights from the point of admission due to high intensity  of service, high risk for further deterioration and high frequency of surveillance required.*  Author: ARTHEA CHILD, MD 03/07/2024 8:20 PM  For on call review [email or web address].

## 2024-03-07 NOTE — ED Triage Notes (Signed)
 Failure to thrive for 7 weeks, last 3 weeks pt has not been able to eat anything. Pt states he vomits everything back up. C/O lower abd pain. Pt has been on ozempic  for 7 months. Pt states he has lost 8lbs in 4 days.  Last BM yesterday.

## 2024-03-07 NOTE — Telephone Encounter (Signed)
  FYI Only or Action Required?: FYI only for provider.  Patient was last seen in primary care on 03/04/2024 by Vicci Bouchard P, DO.  Called Nurse Triage reporting Abdominal Pain.  Symptoms began several weeks ago.  Interventions attempted: Nothing.  Symptoms are: rapidly worsening.  Triage Disposition: Go to ED Now (Notify PCP)  Patient/caregiver understands and will follow disposition?: No, refuses disposition  Girlfriend [name] states signs of dehydration, not eating or drinking x 3 weeks, dry mouth, nausea and vomiting, only taking sips of water, dizziness and weakness.  [name] declined ER and wants him to be seen in office.  This RN called [facility] to report refusal ED.  Copied from CRM [id number]. Topic: Clinical - Red Word Triage >> Mar 07, 2024  9:05 AM Treva T wrote: [facility] that prompted transfer to Nurse Triage: Patient spouse, [name] calling, reports patient is having nausea, and vomiting, abdominal/stomach pain, and burning sensation in throat. Reason for Disposition  [1] SEVERE pain (e.g., excruciating) AND [2] present > 1 hour  Answer Assessment - Initial Assessment Questions 1. LOCATION: Where does it hurt?      Across the top and under ribs, and belly button area 2. RADIATION: Does the pain shoot anywhere else? (e.g., chest, back)     no 3. ONSET: When did the pain begin? (Minutes, hours or days ago)      Two weeks ago 4. SUDDEN: Gradual or sudden onset?     gradually 5. PATTERN Does the pain come and go, or is it constant?     constant 6. SEVERITY: How bad is the pain?  (e.g., Scale 1-10; mild, moderate, or severe)     severe 7. RECURRENT SYMPTOM: Have you ever had this type of stomach pain before? If Yes, ask: When was the last time? and What happened that time?      yes 8. CAUSE: What do you think is causing the stomach pain? (e.g., gallstones, recent abdominal surgery)     unknown 9. RELIEVING/AGGRAVATING FACTORS: What makes it better or worse? (e.g.,  antacids, bending or twisting motion, bowel movement)     no 10. OTHER SYMPTOMS: Do you have any other symptoms? (e.g., back pain, diarrhea, fever, urination pain, vomiting)       States not eating or drinking, vomiting, signs of dehydration  Protocols used: Abdominal Pain - Male-A-AH

## 2024-03-07 NOTE — H&P (Incomplete Revision)
 History and Physical    Patient: Roberto [name] FMW:[id number] DOB: 03-19-1956 DOA: 03/07/2024 DOS: the patient was seen and examined on 03/07/2024 PCP: Roberto Duwaine SQUIBB, DO  Patient coming from: Home  Chief Complaint:  Chief Complaint  Patient presents with   Abdominal Pain   Emesis   HPI: Roberto [name] is a 68 y.o. male with medical history significant for nonobstructive CAD, CHF with EF of 35%, AICD placement, OSA, aortic valve replacement and atrial fibrillation on Coumadin , type 2 diabetes mellitus with peripheral neuropathy who presents with 3 weeks of  not being able to eat anything.  He has terrible dry heaves all day.  He isnt really vomiting. He can only keep down sips of water, milkshakes, and some bites of eggs. He was hospitalized at [facility] from August 2 through February 07, 2024 where he had an AICD upgrade.  He was discharged on continued mexiletine and higher doses of amiodarone .  He is maintained on torsemide  twice daily, spironolactone  25 mg daily Entresto.   Toprol  XL was started at the time of discharge while LAI Sinemet peripheral and losartan  were discontinued.  He was started on Ozempic  approximately 6 months ago. It is unclear when Midodrine  was started.     Review of Systems: As mentioned in the history of present illness. All other systems reviewed and are negative. Past Medical History:  Diagnosis Date   Aneurysm (HCC)    Bicuspid aortic valve    a. s/p #27 [employer]omed[employer] mechanical valve on 03/25/2010; b. on Coumadin ; c. TTE 12/17: EF 40-45%, moderately dilated LV with moderate LVH, AVR well-seated with 14 mmHg gradient, peak AV velocity 2.5 m/s, mild mitral valve thickening with mild MR, mildly dilated RV with mildly reduced contraction   Cellulitis    CHF (congestive heart failure) (HCC)    Chronic kidney disease    Chronic systolic CHF (congestive heart failure) (HCC)    a. R/LHC 03/2010 showed no significant CAD, LVEDP 31 mmHg, mean AoV gradient 34 mmHg at rest  and 47 mmHg with dobutamine 20 mcg/kg/min, AVA 1.0 cm^2, RA 31, RV 68/25, PA 68/47, PCWP 38. PA sat 65%. CO 6.2 L/min (Fick) and 5.3 L/min (thermodilution)   Clotting disorder (HCC)    COPD (chronic obstructive pulmonary disease) (HCC)    H/O mechanical aortic valve replacement 03/25/2010   a. #27 Carbomedics mechanical valve   Hearing loss    High cholesterol    HTN (hypertension)    Hypercholesterolemia    Renal infarct [facility]) 2017   Multiple right renal infarcts, likely embolic.   Stage 3 chronic kidney disease (HCC)    Stroke [facility])    TIA (transient ischemic attack) 05/2014   Past Surgical History:  Procedure Laterality Date   AORTIC VALVE REPLACEMENT     AORTIC VALVE SURGERY     CARDIAC CATHETERIZATION  03/21/2010   No significant CAD. Severe aortic stenosis. Severely elevated left and right heart filling pressures.   CARDIAC SURGERY  2009   CHF   CARDIOVERSION N/A 08/28/2023   Procedure: CARDIOVERSION;  Surgeon: [name] Slain, MD;  Location: [facility] INVASIVE CV LAB;  Service: Cardiovascular;  Laterality: N/A;   CARPAL TUNNEL RELEASE Left 2005   COLONOSCOPY N/A 12/13/2023   Procedure: COLONOSCOPY;  Surgeon: Suzann Inocente HERO, MD;  Location: [facility] ENDOSCOPY;  Service: Gastroenterology;  Laterality: N/A;   ICD IMPLANT N/A 06/25/2023   Procedure: ICD IMPLANT;  Surgeon: Waddell Danelle ORN, MD;  Location: [name] INVASIVE CV LAB;  Service: Cardiovascular;  Laterality: N/A;  PACEMAKER IMPLANT     POLYPECTOMY  12/13/2023   Procedure: POLYPECTOMY, INTESTINE;  Surgeon: Suzann Inocente HERO, MD;  Location: [facility] ENDOSCOPY;  Service: Gastroenterology;;   RIGHT HEART CATH AND CORONARY ANGIOGRAPHY N/A 01/28/2019   Procedure: RIGHT HEART CATH AND CORONARY ANGIOGRAPHY;  Surgeon: Mady Bruckner, MD;  Location: [facility] INVASIVE CV LAB;  Service: Cardiovascular;  Laterality: N/A;   RIGHT HEART CATH AND CORONARY ANGIOGRAPHY N/A 06/20/2023   Procedure: RIGHT HEART CATH AND CORONARY ANGIOGRAPHY;  Surgeon: Mady Bruckner, MD;  Location: [facility] INVASIVE CV LAB;  Service: Cardiovascular;  Laterality: N/A;   TONSILLECTOMY  1962   Social History:  reports that he has been smoking cigarettes. He started smoking about 51 years ago. He has a 23.7 pack-year smoking history. He has never used smokeless tobacco. He reports that he does not drink alcohol and does not use drugs.  Allergies  Allergen Reactions   Nicotine      Patches caused localized rash and hives     Family History  Problem Relation Age of Onset   Arthritis Mother    Dementia Mother    Colon cancer Mother    Arthritis Father    Diabetes Father    Stroke Father    Colon cancer Father    Heart attack Brother    Breast cancer Sister    Seizures Sister    Cancer Brother        brain   Heart disease Brother    Heart attack Brother     Prior to Admission medications   Medication Sig Start Date End Date Taking? Authorizing Provider  albuterol  (PROVENTIL ) (2.5 MG/3ML) 0.083% nebulizer solution USE 1 VIAL IN NEBULIZER EVERY 6 HOURS AS NEEDED FOR WHEEZING FOR SHORTNESS OF BREATH 09/25/23   Roberto Bouchard P, DO  allopurinol  (ZYLOPRIM ) 300 MG tablet Take 1 tablet by mouth once daily 12/03/23   Roberto, [name], DO  amiodarone  (PACERONE ) 400 MG tablet Take 1 tablet (400 mg total) by mouth Three (3) times a day with a meal for 5 days, THEN 1 tablet (400 mg total) daily. Patient taking differently: Take 400 mg by mouth daily. 02/07/24 05/12/24  [provider]  baclofen  (LIORESAL ) 10 MG tablet Take 0.5-1 tablets (5-10 mg total) by mouth at bedtime as needed. 02/25/24   Roberto Bouchard P, DO  buPROPion  (WELLBUTRIN  XL) 300 MG 24 hr tablet Take 300 mg by mouth daily. 08/16/23   [provider]  dapagliflozin  propanediol (FARXIGA ) 10 MG TABS tablet Take 1 tablet (10 mg total) by mouth daily before breakfast. 07/05/23   Donette Ellouise LABOR, FNP  Fluticasone -Umeclidin-Vilant (TRELEGY ELLIPTA ) 200-62.5-25 MCG/ACT AEPB Inhale 1 puff into the lungs  daily in the afternoon. 02/22/24   [name], MD  metFORMIN  (GLUCOPHAGE -XR) 500 MG 24 hr tablet Take 1 tablet by mouth once daily with breakfast 12/20/23   Roberto Bouchard P, DO  metoprolol  succinate (TOPROL -XL) 100 MG 24 hr tablet Take 200 mg by mouth daily. 02/08/24   [provider]  mexiletine (MEXITIL) 150 MG capsule Take 150 mg by mouth 3 (three) times daily. 02/07/24   [provider]  midodrine  (PROAMATINE ) 2.5 MG tablet Take 1 tablet (2.5 mg total) by mouth 2 (two) times daily with a meal. 03/04/24   Roberto, [name], DO  nortriptyline  (PAMELOR ) 10 MG capsule Take 1 capsule by mouth at bedtime 01/18/24   [name], [name], DO  Omega-3 Fatty Acids (OMEGA 3 PO) Take 1,500 mg by mouth 2 (two) times  daily.    [provider]  potassium chloride  SA (KLOR-CON  M) 20 MEQ tablet Take 20 mEq by mouth daily. 02/27/24   [provider]  rosuvastatin  (CRESTOR ) 40 MG tablet Take 1 tablet (40 mg total) by mouth daily. 04/06/23   End, [name], MD  Semaglutide , 1 MG/DOSE, (OZEMPIC , 1 MG/DOSE,) 4 MG/3ML SOPN INJECT 1 MG UNDER THE SKIN ONCE WEEKLY 01/23/24   Roberto Bouchard P, DO  Spacer/Aero-Holding [facility] Use as directed Dx: COPD, J44.9 07/13/23   [name], [name], DO  spironolactone  (ALDACTONE ) 25 MG tablet Take 0.5 tablets (12.5 mg total) by mouth daily. 02/25/24   [name], [name], DO  torsemide  (DEMADEX ) 20 MG tablet Take 1 tablet (20 mg total) by mouth daily. 02/27/24 05/27/24  Donette Ellouise LABOR, FNP  VENTOLIN  HFA 108 (90 Base) MCG/ACT inhaler INHALE 2 PUFFS BY MOUTH EVERY 6 HOURS AS NEEDED FOR WHEEZING OR SHORTNESS OF BREATH 05/16/23   [name], [name], DO  warfarin (COUMADIN ) 1 MG tablet Take 1 tablet (1 mg total) by mouth daily. Patient taking differently: Take 2 mg by mouth daily. 11/30/23   [name], [name], DO  warfarin (COUMADIN ) 3 MG tablet Take 1 tablet (3 mg total) by mouth daily. Patient not taking: Reported on 03/04/2024 12/26/23   Herold Hadassah SQUIBB, MD     Physical Exam: Vitals:   03/07/24 1715 03/07/24 1730 03/07/24 1927 03/07/24 1945  BP: 90/62  92/60 93/69  Pulse: 80 84 (!) 45 80  Resp: 19  19 17   Temp:      TempSrc:      SpO2: 96%  93% 90%  Weight:      Height:       Physical Exam:  General: No acute distress, obese HEENT: Normocephalic, atraumatic, PERRL Cardiovascular: Normal rate and rhythm. Distal pulses intact. Pulmonary: Normal pulmonary effort, normal breath sounds Gastrointestinal: Nondistended abdomen, soft, non-tender, hypooactive bowel sounds Musculoskeletal:Normal ROM, no lower ext edema Lymphadenopathy: No cervical LAD. Skin: Skin is warm and dry. Neuro: No focal deficits noted, AAOx3. PSYCH: Attentive and cooperative  Data Reviewed:  Results for orders placed or performed during the hospital encounter of 03/07/24 (from the past 24 hours)  Urinalysis, Routine w reflex microscopic -Urine, Clean Catch     Status: Abnormal   Collection Time: 03/07/24 12:02 PM  Result Value Ref Range   Color, Urine YELLOW YELLOW   APPearance CLEAR CLEAR   Specific Gravity, Urine 1.007 1.005 - 1.030   pH 6.0 5.0 - 8.0   Glucose, UA >=500 (A) NEGATIVE mg/dL   Hgb urine dipstick NEGATIVE NEGATIVE   Bilirubin Urine NEGATIVE NEGATIVE   Ketones, ur NEGATIVE NEGATIVE mg/dL   Protein, ur NEGATIVE NEGATIVE mg/dL   Nitrite NEGATIVE NEGATIVE   Leukocytes,Ua NEGATIVE NEGATIVE   RBC / HPF 0-5 0 - 5 RBC/hpf   WBC, UA 0-5 0 - 5 WBC/hpf   Bacteria, UA RARE (A) NONE SEEN   Squamous Epithelial / HPF 0-5 0 - 5 /HPF   Mucus PRESENT    Hyaline Casts, UA PRESENT   Lipase, blood     Status: None   Collection Time: 03/07/24 12:13 PM  Result Value Ref Range   Lipase 51 11 - 51 U/L  Comprehensive metabolic panel     Status: Abnormal   Collection Time: 03/07/24 12:13 PM  Result Value Ref Range   Sodium 140 135 - 145 mmol/L   Potassium 3.5 3.5 - 5.1 mmol/L   Chloride 104 98 - 111 mmol/L  CO2 20 (L) 22 - 32 mmol/L   Glucose, Bld 142  (H) 70 - 99 mg/dL   BUN 35 (H) 8 - 23 mg/dL   Creatinine, Ser 7.50 (H) 0.61 - 1.24 mg/dL   Calcium  9.8 8.9 - 10.3 mg/dL   Total Protein 7.7 6.5 - 8.1 g/dL   Albumin 3.7 3.5 - 5.0 g/dL   AST 63 (H) 15 - 41 U/L   ALT 58 (H) 0 - 44 U/L   Alkaline Phosphatase 59 38 - 126 U/L   Total Bilirubin 0.8 0.0 - 1.2 mg/dL   GFR, Estimated 27 (L) >60 mL/min   Anion gap 16 (H) 5 - 15  CBC     Status: Abnormal   Collection Time: 03/07/24 12:13 PM  Result Value Ref Range   WBC 11.1 (H) 4.0 - 10.5 K/uL   RBC 5.37 4.22 - 5.81 MIL/uL   Hemoglobin 15.5 13.0 - 17.0 g/dL   HCT 51.1 60.9 - 47.9 %   MCV 90.9 80.0 - 100.0 fL   MCH 28.9 26.0 - 34.0 pg   MCHC 31.8 30.0 - 36.0 g/dL   RDW 84.4 88.4 - 84.4 %   Platelets 254 150 - 400 K/uL   nRBC 0.0 0.0 - 0.2 %  Protime-INR     Status: Abnormal   Collection Time: 03/07/24 12:47 PM  Result Value Ref Range   Prothrombin Time 26.4 (H) 11.4 - 15.2 seconds   INR 2.3 (H) 0.8 - 1.2   *Note: Due to a large number of results and/or encounters for the requested time period, some results have not been displayed. A complete set of results can be found in Results Review.    CT abd/ pelvis  IMPRESSION: 1. Accentuated density in the liver, parenchyma measuring at 106 Hounsfield units on this noncontrast exam, raising the possibility of hemochromatosis. 2. Faint clustered tree-in-bud nodularity in the right lower lobe, favoring atypical infectious bronchiolitis. 3. Mild to moderate cardiomegaly. 4. Dependent density in the gallbladder is indistinct and may be from sludge or gallstones. 5. Bilateral foraminal impingement at L5-S1 due to facet arthropathy, intervertebral spurring, and a suspected nitrogen gas filled disc protrusion in the right neural foramen. 6.  Aortic Atherosclerosis (ICD10-I70.0).   Assessment and Plan: Unable to eat/unintentional weight loss-         2.  Hypotension -metoprolol  was started last month at [facility].  Midodrine  was started 5 days  ago. Will discontinue the Metoprolol .  Will try to wean off the midodrine  which was recently started as an outpatient. 3.  History of atrial fibrillation and mechanical aortic valve -continue Coumadin  4. CHF EF= 15% He may be slightly volume depleted 5. DMT2 -will also hold Ozempic  for now (Mondays) .  His last dose was 5 days ago.    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  He does have an AICD in place.  Consults: None  Family Communication: None  Severity of Illness: The appropriate patient status for this patient is INPATIENT. Inpatient status is judged to be reasonable and necessary in order to provide the required intensity of service to ensure the patient's safety. The patient's presenting symptoms, physical exam findings, and initial radiographic and laboratory data in the context of their chronic comorbidities is felt to place them at high risk for further clinical deterioration. Furthermore, it is not anticipated that the patient will be medically stable for  discharge from the hospital within 2 midnights of admission.   * I certify that at the point of admission it is my clinical judgment that the patient will require inpatient hospital care spanning beyond 2 midnights from the point of admission due to high intensity of service, high risk for further deterioration and high frequency of surveillance required.*  Author: ARTHEA CHILD, MD 03/07/2024 8:20 PM  For on call review [email or web address].

## 2024-03-07 NOTE — Telephone Encounter (Signed)
 See mychart message with patient. Patient currently at the ER.

## 2024-03-07 NOTE — ED Provider Notes (Signed)
 [facility] EMERGENCY DEPARTMENT AT [facility] Provider Note  CSN: [id number] Arrival date & time: 03/07/24 1100  Chief Complaint(s) Abdominal Pain and Emesis  HPI Roberto [name] is a 68 y.o. male history of CHF, CKD, COPD, diabetes, hypertension, hyperlipidemia, prior stroke, on warfarin presenting to the emergency department with nausea and vomiting.  Patient reports nausea and vomiting, abdominal pain has been going on for almost 7 weeks, worse in the past few weeks.  Reports he does take Ozempic  and has been taking this.  No leg swelling, difficulty breathing, chest pain.  Saw primary doctor for this few days ago, had a CT scan.  Pain is worse with eating.  Wife is concerned that he is dehydrated.  Apparently he only eats eggs and drinks milkshakes.  No significant change in symptoms since recent CT scan.   Past Medical History Past Medical History:  Diagnosis Date   Aneurysm (HCC)    Bicuspid aortic valve    a. s/p #27 Carbomedics mechanical valve on 03/25/2010; b. on Coumadin ; c. TTE 12/17: EF 40-45%, moderately dilated LV with moderate LVH, AVR well-seated with 14 mmHg gradient, peak AV velocity 2.5 m/s, mild mitral valve thickening with mild MR, mildly dilated RV with mildly reduced contraction   Cellulitis    CHF (congestive heart failure) (HCC)    Chronic kidney disease    Chronic systolic CHF (congestive heart failure) (HCC)    a. R/LHC 03/2010 showed no significant CAD, LVEDP 31 mmHg, mean AoV gradient 34 mmHg at rest and 47 mmHg with dobutamine 20 mcg/kg/min, AVA 1.0 cm^2, RA 31, RV 68/25, PA 68/47, PCWP 38. PA sat 65%. CO 6.2 L/min (Fick) and 5.3 L/min (thermodilution)   Clotting disorder (HCC)    COPD (chronic obstructive pulmonary disease) (HCC)    H/O mechanical aortic valve replacement 03/25/2010   a. #27 Carbomedics mechanical valve   Hearing loss    High cholesterol    HTN (hypertension)    Hypercholesterolemia    Renal infarct [facility]) 2017   Multiple right  renal infarcts, likely embolic.   Stage 3 chronic kidney disease (HCC)    Stroke [facility])    TIA (transient ischemic attack) 05/2014   Patient Active Problem List   Diagnosis Date Noted   Presence of heart assist device (HCC) 02/15/2024   Polyp of colon 12/13/2023   PTSD (post-traumatic stress disorder) 11/15/2023   DM type 2 with diabetic peripheral neuropathy (HCC) 10/23/2023   Essential tremor 10/23/2023   Positive occult stool blood test 10/10/2023   Pacemaker 09/11/2023   Persistent atrial fibrillation (HCC) 07/12/2023   Visit for wound check 07/10/2023   Acute on chronic systolic CHF (congestive heart failure) (HCC) 06/22/2023   VT (ventricular tachycardia) (HCC) 06/18/2023   Hypotension 06/18/2023   Atrial fibrillation (HCC) 06/17/2023   Depression 06/17/2023   Tremor of left hand 05/29/2022   Aneurysm of ascending aorta without rupture (HCC) 02/17/2022   Anticoagulated on Coumadin  07/29/2021   Chronic kidney disease, stage 3a (HCC) 12/09/2020   Hyperlipidemia associated with type 2 diabetes mellitus (HCC) 12/09/2020   Coronary artery disease involving native coronary artery of native heart without angina pectoris 09/10/2019   Osteoarthritis of spine with radiculopathy, lumbar region 11/21/2018   History of aortic stenosis 06/05/2018   Chronic gout without tophus 04/03/2018   Valvular heart disease 01/17/2018   Morbid obesity (HCC) 01/17/2018   Chronic HFrEF (heart failure with reduced ejection fraction) (HCC) 09/27/2016   Renal infarct (HCC) 05/01/2016   Type 2  diabetes mellitus with cardiac complication (HCC) 04/19/2016   Chronic bilateral low back pain without sciatica 04/19/2016   Thoracic aortic aneurysm without rupture (HCC) 03/16/2016   Aortic atherosclerosis (HCC) 03/16/2016   Nicotine  dependence, cigarettes, uncomplicated 03/14/2016   NICM (nonischemic cardiomyopathy) (HCC) 03/02/2016   COPD (chronic obstructive pulmonary disease) (HCC) 09/09/2015   H/O  mechanical aortic valve replacement 02/09/2015   Hypertension associated with diabetes (HCC) 02/09/2015   Home Medication(s) Prior to Admission medications   Medication Sig Start Date End Date Taking? Authorizing Provider  albuterol  (PROVENTIL ) (2.5 MG/3ML) 0.083% nebulizer solution USE 1 VIAL IN NEBULIZER EVERY 6 HOURS AS NEEDED FOR WHEEZING FOR SHORTNESS OF BREATH 09/25/23   Vicci Bouchard P, DO  allopurinol  (ZYLOPRIM ) 300 MG tablet Take 1 tablet by mouth once daily 12/03/23   Vicci, [name], DO  amiodarone  (PACERONE ) 400 MG tablet Take 1 tablet (400 mg total) by mouth Three (3) times a day with a meal for 5 days, THEN 1 tablet (400 mg total) daily. Patient taking differently: Take 400 mg by mouth daily. 02/07/24 05/12/24  [provider]  baclofen  (LIORESAL ) 10 MG tablet Take 0.5-1 tablets (5-10 mg total) by mouth at bedtime as needed. 02/25/24   Vicci Bouchard P, DO  buPROPion  (WELLBUTRIN  XL) 300 MG 24 hr tablet Take 300 mg by mouth daily. 08/16/23   [provider]  dapagliflozin  propanediol (FARXIGA ) 10 MG TABS tablet Take 1 tablet (10 mg total) by mouth daily before breakfast. 07/05/23   Donette Ellouise LABOR, FNP  Fluticasone -Umeclidin-Vilant (TRELEGY ELLIPTA ) 200-62.5-25 MCG/ACT AEPB Inhale 1 puff into the lungs daily in the afternoon. 02/22/24   [name], MD  metFORMIN  (GLUCOPHAGE -XR) 500 MG 24 hr tablet Take 1 tablet by mouth once daily with breakfast 12/20/23   Vicci Bouchard P, DO  metoprolol  succinate (TOPROL -XL) 100 MG 24 hr tablet Take 200 mg by mouth daily. 02/08/24   [provider]  mexiletine (MEXITIL) 150 MG capsule Take 150 mg by mouth 3 (three) times daily. 02/07/24   [provider]  midodrine  (PROAMATINE ) 2.5 MG tablet Take 1 tablet (2.5 mg total) by mouth 2 (two) times daily with a meal. 03/04/24   Vicci, [name], DO  nortriptyline  (PAMELOR ) 10 MG capsule Take 1 capsule by mouth at bedtime 01/18/24   [name], [name], DO  Omega-3 Fatty Acids  (OMEGA 3 PO) Take 1,500 mg by mouth 2 (two) times daily.    [provider]  potassium chloride  SA (KLOR-CON  M) 20 MEQ tablet Take 20 mEq by mouth daily. 02/27/24   [provider]  rosuvastatin  (CRESTOR ) 40 MG tablet Take 1 tablet (40 mg total) by mouth daily. 04/06/23   End, [name], MD  Semaglutide , 1 MG/DOSE, (OZEMPIC , 1 MG/DOSE,) 4 MG/3ML SOPN INJECT 1 MG UNDER THE SKIN ONCE WEEKLY 01/23/24   Vicci Bouchard P, DO  Spacer/Aero-Holding [facility] Use as directed Dx: COPD, J44.9 07/13/23   Vicci Bouchard P, DO  spironolactone  (ALDACTONE ) 25 MG tablet Take 0.5 tablets (12.5 mg total) by mouth daily. 02/25/24   [name], [name], DO  torsemide  (DEMADEX ) 20 MG tablet Take 1 tablet (20 mg total) by mouth daily. 02/27/24 05/27/24  Donette Ellouise LABOR, FNP  VENTOLIN  HFA 108 (90 Base) MCG/ACT inhaler INHALE 2 PUFFS BY MOUTH EVERY 6 HOURS AS NEEDED FOR WHEEZING OR SHORTNESS OF BREATH 05/16/23   [name], [name], DO  warfarin (COUMADIN ) 1 MG tablet Take 1 tablet (1 mg total) by mouth daily. Patient taking differently: Take  2 mg by mouth daily. 11/30/23   [name], [name], DO  warfarin (COUMADIN ) 3 MG tablet Take 1 tablet (3 mg total) by mouth daily. Patient not taking: Reported on 03/04/2024 12/26/23   Herold Hadassah SQUIBB, MD                                                                                                                                    Past Surgical History Past Surgical History:  Procedure Laterality Date   AORTIC VALVE REPLACEMENT     AORTIC VALVE SURGERY     CARDIAC CATHETERIZATION  03/21/2010   No significant CAD. Severe aortic stenosis. Severely elevated left and right heart filling pressures.   CARDIAC SURGERY  2009   CHF   CARDIOVERSION N/A 08/28/2023   Procedure: CARDIOVERSION;  Surgeon: [name] Slain, MD;  Location: [facility] INVASIVE CV LAB;  Service: Cardiovascular;  Laterality: N/A;   CARPAL TUNNEL RELEASE Left 2005   COLONOSCOPY N/A 12/13/2023   Procedure:  COLONOSCOPY;  Surgeon: Suzann Inocente HERO, MD;  Location: [facility] ENDOSCOPY;  Service: Gastroenterology;  Laterality: N/A;   ICD IMPLANT N/A 06/25/2023   Procedure: ICD IMPLANT;  Surgeon: Waddell Danelle ORN, MD;  Location: [facility] INVASIVE CV LAB;  Service: Cardiovascular;  Laterality: N/A;   PACEMAKER IMPLANT     POLYPECTOMY  12/13/2023   Procedure: POLYPECTOMY, INTESTINE;  Surgeon: Suzann Inocente HERO, MD;  Location: [facility] ENDOSCOPY;  Service: Gastroenterology;;   RIGHT HEART CATH AND CORONARY ANGIOGRAPHY N/A 01/28/2019   Procedure: RIGHT HEART CATH AND CORONARY ANGIOGRAPHY;  Surgeon: [name], MD;  Location: [facility] INVASIVE CV LAB;  Service: Cardiovascular;  Laterality: N/A;   RIGHT HEART CATH AND CORONARY ANGIOGRAPHY N/A 06/20/2023   Procedure: RIGHT HEART CATH AND CORONARY ANGIOGRAPHY;  Surgeon: [name], MD;  Location: [facility] INVASIVE CV LAB;  Service: Cardiovascular;  Laterality: N/A;   TONSILLECTOMY  1962   Family History Family History  Problem Relation Age of Onset   Arthritis Mother    Dementia Mother    Colon cancer Mother    Arthritis Father    Diabetes Father    Stroke Father    Colon cancer Father    Heart attack Brother    Breast cancer Sister    Seizures Sister    Cancer Brother        brain   Heart disease Brother    Heart attack Brother     Social History Social History   Tobacco Use   Smoking status: Every Day    Current packs/day: 0.20    Average packs/day: 0.5 packs/day for 48.2 years (23.7 ttl pk-yrs)    Types: Cigarettes    Start date: 1974    Last attempt to quit: 2021   Smokeless tobacco: Never   Tobacco comments:    3 or less cigarettes per day  Vaping Use   Vaping status: Never Used  Substance Use Topics   Alcohol use:  Never   Drug use: Never   Allergies Nicotine   Review of Systems Review of Systems  All other systems reviewed and are negative.   Physical Exam Vital Signs  I have reviewed the triage vital signs BP (!) 88/69   Pulse 79    Temp 97.7 F (36.5 C)   Resp 14   Ht 5' 6 (1.676 m)   Wt 107 kg   SpO2 99%   BMI 38.09 kg/m  Physical Exam Vitals and nursing note reviewed.  Constitutional:      General: He is not in acute distress.    Appearance: Normal appearance. He is obese.  HENT:     Mouth/Throat:     Mouth: Mucous membranes are dry.  Eyes:     Conjunctiva/sclera: Conjunctivae normal.  Cardiovascular:     Rate and Rhythm: Normal rate and regular rhythm.  Pulmonary:     Effort: Pulmonary effort is normal. No respiratory distress.     Breath sounds: Normal breath sounds.  Abdominal:     General: Abdomen is flat.     Palpations: Abdomen is soft.     Tenderness: There is abdominal tenderness (generalized).  Musculoskeletal:     Right lower leg: No edema.     Left lower leg: No edema.  Skin:    General: Skin is warm and dry.     Capillary Refill: Capillary refill takes less than 2 seconds.  Neurological:     Mental Status: He is alert and oriented to person, place, and time. Mental status is at baseline.  Psychiatric:        Mood and Affect: Mood normal.        Behavior: Behavior normal.     ED Results and Treatments Labs (all labs ordered are listed, but only abnormal results are displayed) Labs Reviewed  COMPREHENSIVE METABOLIC PANEL WITH GFR - Abnormal; Notable for the following components:      Result Value   CO2 20 (*)    Glucose, Bld 142 (*)    BUN 35 (*)    Creatinine, Ser 2.49 (*)    AST 63 (*)    ALT 58 (*)    GFR, Estimated 27 (*)    Anion gap 16 (*)    All other components within normal limits  CBC - Abnormal; Notable for the following components:   WBC 11.1 (*)    All other components within normal limits  URINALYSIS, ROUTINE W REFLEX MICROSCOPIC - Abnormal; Notable for the following components:   Glucose, UA >=500 (*)    Bacteria, UA RARE (*)    All other components within normal limits  PROTIME-INR - Abnormal; Notable for the following components:   Prothrombin Time  26.4 (*)    INR 2.3 (*)    All other components within normal limits  LIPASE, BLOOD  Radiology No results found.  Pertinent labs & imaging results that were available during my care of the patient were reviewed by me and considered in my medical decision making (see MDM for details).  Medications Ordered in ED Medications  sodium chloride  0.9 % bolus 1,000 mL (1,000 mLs Intravenous New Bag/Given 03/07/24 1313)  ondansetron  (ZOFRAN ) injection 4 mg (4 mg Intravenous Given 03/07/24 1312)                                                                                                                                     Procedures Procedures  (including critical care time)  Medical Decision Making / ED Course   MDM:  68 year old presenting to the emergency department with nausea and vomiting.  Patient overall well-appearing, physical examination with mild generalized tenderness, patient does appear mildly dehydrated.  Differential includes gastroparesis, side effect of Ozempic , intra-abdominal process such as biliary colic.  Symptoms seems somewhat chronic.  Patient has had recent imaging as outpatient which showed no bowel obstruction.  Had some nonspecific liver changes but do not think this is related to the patient's pain.  His LFTs are only mildly elevated.  Did show some sludge in gallbladder which could be related.  Labs do show mild AKI on CKD.  Will obtain right upper quadrant ultrasound.  Given recent CT scan do not think he needs a repeat CT scan without any significant change in his symptoms.  Will reassess.  Labs do show some hypotension which may be related to dehydration versus his chronic disease.  Apparently on review of records he seems to be pretty chronically hypotensive.  Clinical Course as of 03/07/24 1537  Fri Mar 07, 2024  1509 Handoff WS 68 yo/m   Here w/ abd pain, n/v, ppm, mechanical valve on coumadin  Poor PO, only drinking milkshakes over last 6 wks Cr elev, acute on chronic aki CTAP recent neg RUQ u/s pending Chronic hypotension He is on ozempic , this should be d/c'd Anticipate admission  [SG]  1533 Signed out to Dr. Elnor pending RUQ us , re-assessment. [WS]    Clinical Course User Index [SG] Elnor Jayson LABOR, DO [WS] Francesca Elsie CROME, MD     Additional history obtained: -Additional history obtained from spouse -External records from outside source obtained and reviewed including: Chart review including previous notes, labs, imaging, consultation notes including prior labs and results including recent CT scan   Lab Tests: -I ordered, reviewed, and interpreted labs.   The pertinent results include:   Labs Reviewed  COMPREHENSIVE METABOLIC PANEL WITH GFR - Abnormal; Notable for the following components:      Result Value   CO2 20 (*)    Glucose, Bld 142 (*)    BUN 35 (*)    Creatinine, Ser 2.49 (*)    AST 63 (*)    ALT 58 (*)    GFR, Estimated 27 (*)    Anion gap 16 (*)  All other components within normal limits  CBC - Abnormal; Notable for the following components:   WBC 11.1 (*)    All other components within normal limits  URINALYSIS, ROUTINE W REFLEX MICROSCOPIC - Abnormal; Notable for the following components:   Glucose, UA >=500 (*)    Bacteria, UA RARE (*)    All other components within normal limits  PROTIME-INR - Abnormal; Notable for the following components:   Prothrombin Time 26.4 (*)    INR 2.3 (*)    All other components within normal limits  LIPASE, BLOOD    Notable for AKI on CKD  EKG   EKG Interpretation Date/Time:  Friday March 07 2024 12:54:25 EDT Ventricular Rate:  80 PR Interval:  66 QRS Duration:  217 QT Interval:  562 QTC Calculation: 649 R Axis:   -84  Text Interpretation: VENTRICULAR PACED RHYTHM Confirmed by Francesca Fallow ([id number]) on 03/07/2024 2:22:35 PM          Imaging Studies ordered: I ordered imaging studies including RUQ US   Pending at sign out    Medicines ordered and prescription drug management: Meds ordered this encounter  Medications   sodium chloride  0.9 % bolus 1,000 mL   ondansetron  (ZOFRAN ) injection 4 mg    -I have reviewed the patients home medicines and have made adjustments as needed   Social Determinants of Health:  Diagnosis or treatment significantly limited by social determinants of health: obesity   Reevaluation: After the interventions noted above, I reevaluated the patient and found that their symptoms have improved  Co morbidities that complicate the patient evaluation  Past Medical History:  Diagnosis Date   Aneurysm (HCC)    Bicuspid aortic valve    a. s/p #27 [employer]omed[employer] mechanical valve on 03/25/2010; b. on Coumadin ; c. TTE 12/17: EF 40-45%, moderately dilated LV with moderate LVH, AVR well-seated with 14 mmHg gradient, peak AV velocity 2.5 m/s, mild mitral valve thickening with mild MR, mildly dilated RV with mildly reduced contraction   Cellulitis    CHF (congestive heart failure) (HCC)    Chronic kidney disease    Chronic systolic CHF (congestive heart failure) (HCC)    a. R/LHC 03/2010 showed no significant CAD, LVEDP 31 mmHg, mean AoV gradient 34 mmHg at rest and 47 mmHg with dobutamine 20 mcg/kg/min, AVA 1.0 cm^2, RA 31, RV 68/25, PA 68/47, PCWP 38. PA sat 65%. CO 6.2 L/min (Fick) and 5.3 L/min (thermodilution)   Clotting disorder (HCC)    COPD (chronic obstructive pulmonary disease) (HCC)    H/O mechanical aortic valve replacement 03/25/2010   a. #27 Carbomed[employer] mechanical valve   Hearing loss    High cholesterol    HTN (hypertension)    Hypercholesterolemia    Renal infarct [facility]) 2017   Multiple right renal infarcts, likely embolic.   Stage 3 chronic kidney disease (HCC)    Stroke (HCC)    TIA (transient ischemic attack) 05/2014      Dispostion: Disposition decision including need  for hospitalization was considered, and patient disposition pending at time of sign out.    Final Clinical Impression(s) / ED Diagnoses Final diagnoses:  Nausea and vomiting, unspecified vomiting type  Acute kidney injury superimposed on chronic kidney disease (HCC)     This chart was dictated using voice recognition software.  Despite best efforts to proofread,  errors can occur which can change the documentation meaning.    Francesca Fallow CROME, MD 03/07/24 1537

## 2024-03-07 NOTE — ED Triage Notes (Signed)
 C/o abd. Pain unable to keep anything down , however drinking milkshake at triage, patient has a pacemarker, Had CT done wed.

## 2024-03-07 NOTE — Telephone Encounter (Signed)
 SNAP will reach back out to the patient.  Nothing further needed.

## 2024-03-07 NOTE — Telephone Encounter (Signed)
 Copied from CRM [id number]. Topic: Clinical - Lab/Test Results >> Mar 05, 2024  4:11 PM Fonda T wrote: Reason for CRM: Received call from [name], caregiver of patient, calling requesting CT imaging results.  Per caller, states results have been released via mychart and would like to speak with provider to discuss results.  Per chart review, provider has not yet viewed results.  Per caller, requesting a return call as soon as result are reviewed, as soon as possible to discuss findings.  Can be reached at [phone].

## 2024-03-08 DIAGNOSIS — R634 Abnormal weight loss: Secondary | ICD-10-CM | POA: Diagnosis not present

## 2024-03-08 LAB — COMPREHENSIVE METABOLIC PANEL WITH GFR
ALT: 54 U/L — ABNORMAL HIGH (ref 0–44)
AST: 63 U/L — ABNORMAL HIGH (ref 15–41)
Albumin: 3.3 g/dL — ABNORMAL LOW (ref 3.5–5.0)
Alkaline Phosphatase: 57 U/L (ref 38–126)
Anion gap: 13 (ref 5–15)
BUN: 31 mg/dL — ABNORMAL HIGH (ref 8–23)
CO2: 24 mmol/L (ref 22–32)
Calcium: 9.4 mg/dL (ref 8.9–10.3)
Chloride: 105 mmol/L (ref 98–111)
Creatinine, Ser: 1.98 mg/dL — ABNORMAL HIGH (ref 0.61–1.24)
GFR, Estimated: 36 mL/min — ABNORMAL LOW (ref >60)
Glucose, Bld: 103 mg/dL — ABNORMAL HIGH (ref 70–99)
Potassium: 3.6 mmol/L (ref 3.5–5.1)
Sodium: 142 mmol/L (ref 135–145)
Total Bilirubin: 0.6 mg/dL (ref 0.0–1.2)
Total Protein: 6.9 g/dL (ref 6.5–8.1)

## 2024-03-08 LAB — CBC
HCT: 47.9 % (ref 39.0–52.0)
Hemoglobin: 14.7 g/dL (ref 13.0–17.0)
MCH: 28.3 pg (ref 26.0–34.0)
MCHC: 30.7 g/dL (ref 30.0–36.0)
MCV: 92.3 fL (ref 80.0–100.0)
Platelets: 254 K/uL (ref 150–400)
RBC: 5.19 MIL/uL (ref 4.22–5.81)
RDW: 15.5 % (ref 11.5–15.5)
WBC: 9.3 K/uL (ref 4.0–10.5)
nRBC: 0 % (ref 0.0–0.2)

## 2024-03-08 LAB — BRAIN NATRIURETIC PEPTIDE: B Natriuretic Peptide: 421.1 pg/mL — ABNORMAL HIGH (ref 0.0–100.0)

## 2024-03-08 LAB — T3, FREE: T3, Free: 2.2 pg/mL (ref 2.0–4.4)

## 2024-03-08 LAB — PROTIME-INR
INR: 2.4 — ABNORMAL HIGH (ref 0.8–1.2)
Prothrombin Time: 27.7 s — ABNORMAL HIGH (ref 11.4–15.2)

## 2024-03-08 LAB — MAGNESIUM: Magnesium: 2.2 mg/dL (ref 1.7–2.4)

## 2024-03-08 MED ORDER — ORAL CARE MOUTH RINSE: 15.0000 mL | OROMUCOSAL | Status: DC | PRN

## 2024-03-08 MED ORDER — WARFARIN - PHYSICIAN DOSING INPATIENT: Freq: Every day | Status: DC

## 2024-03-08 MED ORDER — DAPAGLIFLOZIN PROPANEDIOL 10 MG PO TABS
10.0000 mg | ORAL_TABLET | Freq: Every day | ORAL | Status: DC
  Administered 2024-03-08: 10 mg via ORAL
  Filled 2024-03-08: qty 1

## 2024-03-08 MED ORDER — ROSUVASTATIN CALCIUM 20 MG PO TABS
40.0000 mg | ORAL_TABLET | Freq: Every day | ORAL | Status: DC
  Administered 2024-03-09: 40 mg via ORAL
  Filled 2024-03-08: qty 2

## 2024-03-08 MED ORDER — BUPROPION HCL ER (XL) 150 MG PO TB24
300.0000 mg | ORAL_TABLET | Freq: Every day | ORAL | Status: DC
Start: 2024-03-08 — End: 2024-03-09
  Administered 2024-03-08 – 2024-03-09 (×2): 300 mg via ORAL
  Filled 2024-03-08 (×2): qty 2

## 2024-03-08 MED ORDER — MEXILETINE HCL 150 MG PO CAPS
150.0000 mg | ORAL_CAPSULE | Freq: Three times a day (TID) | ORAL | Status: DC
Start: 2024-03-08 — End: 2024-03-09
  Administered 2024-03-08 – 2024-03-09 (×5): 150 mg via ORAL
  Filled 2024-03-08 (×6): qty 1

## 2024-03-08 MED ORDER — NORTRIPTYLINE HCL 10 MG PO CAPS
10.0000 mg | ORAL_CAPSULE | Freq: Every day | ORAL | Status: DC
  Administered 2024-03-08 (×2): 10 mg via ORAL
  Filled 2024-03-08 (×4): qty 1

## 2024-03-08 MED ORDER — ALBUTEROL SULFATE (2.5 MG/3ML) 0.083% IN NEBU: 2.5000 mg | INHALATION_SOLUTION | RESPIRATORY_TRACT | Status: DC | PRN

## 2024-03-08 MED ORDER — WARFARIN SODIUM 2 MG PO TABS
2.0000 mg | ORAL_TABLET | Freq: Every day | ORAL | Status: DC
  Administered 2024-03-08 – 2024-03-09 (×3): 2 mg via ORAL
  Filled 2024-03-08 (×3): qty 1

## 2024-03-08 MED ORDER — ALLOPURINOL 300 MG PO TABS
300.0000 mg | ORAL_TABLET | Freq: Every day | ORAL | Status: DC
  Administered 2024-03-08: 300 mg via ORAL
  Filled 2024-03-08 (×2): qty 1

## 2024-03-08 MED ORDER — AMIODARONE HCL 200 MG PO TABS
400.0000 mg | ORAL_TABLET | Freq: Every day | ORAL | Status: DC
  Administered 2024-03-08 – 2024-03-09 (×2): 400 mg via ORAL
  Filled 2024-03-08 (×2): qty 2

## 2024-03-08 MED ORDER — SODIUM CHLORIDE 0.9 % IV BOLUS
500.0000 mL | Freq: Once | INTRAVENOUS | Status: AC
  Administered 2024-03-08: 500 mL via INTRAVENOUS

## 2024-03-08 NOTE — Plan of Care (Signed)

## 2024-03-08 NOTE — Progress Notes (Addendum)
 PROGRESS NOTE  Roberto [name]  FMW:[id number] DOB: Dec 03, 1955 DOA: 03/07/2024 PCP: [name], DO  Consultants  Brief Narrative: 68 y.o. male with medical history significant for nonobstructive CAD, CHF with EF of 35%, AICD placement, OSA, aortic valve replacement and atrial fibrillation on Coumadin , type 2 diabetes mellitus with peripheral neuropathy who presents with 3 weeks of  not being able to eat anything.  He has terrible dry heaves when he tries to eat.  He isnt really vomiting. He can only keep down sips of water, milkshakes, and some bites of eggs. He has lost his sense of taste. He believes he has lost at least 12 pounds in the last week. He was hospitalized at [facility] from August 2 through February 07, 2024 where he had an AICD upgrade.  He was discharged on continued mexiletine and higher doses of amiodarone .  He is maintained on torsemide  twice daily, spironolactone  25 mg daily Entresto.   Toprol  XL was started at the time of discharge while Lisinopril  and losartan  were discontinued.  He was started on Ozempic  approximately 6 months ago. Midodrine  was started 5 days ago by his PCP for a systolic blood pressure of 79 in the office.   Assessment & Plan: Dehydration: - secondary to inability to hold down any food or much liquid for that matter.   - Ozempic  DC'ed on admission - Feels much better today after 1L fluid bolus than he did.  No further abdominal pain today.  Hungry and thirsty - reports that after his fluid bolus was the first time he was able to produce saliva in several weeks.  Mouth dry again this AM - Working diagnosis is severe dehydration secondary to ozempic  usage (pharmacy reports weekly dosage as 4mg , much higher than usual high dosage of 2 mg weekly).  Pt also on daily dapagliflozin  throughout this entire course, likely worsening symptoms of dehydration.  This was continued on admission, I've stopped this now in light of his fluid losses.  - Has notably low EF.  I  have given him another gentle 500 CC bolus and started fluid liquid diet to rehydrate.   AKI on CKD Stage IIIb: - GFR has been in 40s majority of this year.  Was 27 on admission - stopping farxiga /ozempic  as above - gentle rehydration via IV repeated again today, now continue oral rehydration - GFR 36 today, will trend.  Creatinine is all over the place, follow as well   H/o VT and AICD placement.   Patient recently had recurrent V. tach and was hospitalized to [facility]. He is status post upgrade of his AICD - stable - Continue mexiletine and amiodarone  - Keep mg>2 and k>4   Atrial fib and mechanical aortic valve Continue Coumadin   CHF EF= 15%  Volume depleted on admission, still appears dry on exam.  He did receive some IV fluids in the emergency department.  Will hold his diuretics for at least the first 24 hours - VERY low threshold to consult cardiology.  He looks great today, however, and I'm hoping we can DC him home shortly, possibly as early as tomorrow if he continues to improve.    DM T2: -  Hold Ozempic  and farxiga .  Recommend stopping Ozempic  for good (he tells me his inability to eat has been ongoing for months while using this) - would also recommend holding SGLT-2 until we're sure his fluid status has stabilized.  - Hold Metformin  - Corrective dose insulin  as needed   Hypotension: -Metoprolol  was started last  month at [facility].  Midodrine  was started 5 days ago. Metoprolol  held on admission.  - BP better this AM with systolics in 90-100s.   - Again, I believe this is all secondary to inability to take PO outpt 2/2 ozempic  usage.  Continue oral hydration and follow BPs as we hold both GLP-1 and SGLT-2  Transaminitis:  - likely multifactorial - present since Feb of this year - CT/US  both show some increased liver echotexture, concerning for possible hemochromatosis - fortunately, ferritin was normal on check.   - will defer to outpt GI    DVT prophylaxis:   warfarin  (COUMADIN ) tablet 2 mg  Code Status:   Code Status: Full Code Family Communication: spoke with friend Roberto [name] by phone to update her Level of care: Med-Surg Status is: Inpatient Dispo:  hopefully home 9/7 if he continues to improve.  Subjective: Patient feels much better today, reports it's better than he's felt in weeks.  Able to produce some saliva after his IVF bolus.  Wants to eat and drink.  No chest pain/SOB.  No LE edema.   Objective: Vitals:   03/07/24 2156 03/08/24 0342 03/08/24 0745 03/08/24 1246  BP: 103/80 97/76 93/65  104/74  Pulse: 83 79 79 79  Resp: 18 16 18 18   Temp: 98.3 F (36.8 C) 97.9 F (36.6 C)    TempSrc: Oral Oral    SpO2: 99% 95% 91% 99%  Weight: 110.7 kg     Height: 5' 6 (1.676 m)       Intake/Output Summary (Last 24 hours) at 03/08/2024 1442 Last data filed at 03/08/2024 0400 Gross per 24 hour  Intake 660 ml  Output --  Net 660 ml   Filed Weights   03/07/24 1114 03/07/24 2156  Weight: 107 kg 110.7 kg   Body mass index is 39.39 kg/m.  Gen: 68 y.o. male in no apparent distress.  Nontoxic but chronically ill-appearing HEENT:  PERRL, very dry mucus membranes Pulm: Non-labored breathing.  Clear to auscultation bilaterally.  CV: Regular rate and rhythm. GI: Abdomen soft, non-tender, non-distended Ext: Warm, no deformities, no pedal edema Skin: No rashes, lesions  Neuro: Alert and oriented. No focal neurological deficits. Psych: Calm  Judgement and insight appear normal. Mood & affect appropriate.    I have personally reviewed the following labs and images: CBC: Recent Labs  Lab 03/04/24 1552 03/07/24 1213 03/08/24 1208  WBC 15.3* 11.1* 9.3  NEUTROABS 11.7*  --   --   HGB 15.8 15.5 14.7  HCT 50.9 48.8 47.9  MCV 92 90.9 92.3  PLT 267 254 254   BMP &GFR Recent Labs  Lab 03/04/24 1552 03/07/24 1213 03/08/24 0235 03/08/24 1208  NA 142 140  --  142  K 4.2 3.5  --  3.6  CL 100 104  --  105  CO2 24 20*  --  24  GLUCOSE 93 142*  --  103*   BUN 33* 35*  --  31*  CREATININE 2.85* 2.49*  --  1.98*  CALCIUM  10.3* 9.8  --  9.4  MG  --   --  2.2  --    Estimated Creatinine Clearance: 41.1 mL/min (A) (by C-G formula based on SCr of 1.98 mg/dL (H)). Liver & Pancreas: Recent Labs  Lab 03/04/24 1552 03/07/24 1213 03/08/24 1208  AST 57* 63* 63*  ALT 61* 58* 54*  ALKPHOS 78 59 57  BILITOT 0.4 0.8 0.6  PROT 7.3 7.7 6.9  ALBUMIN 4.5 3.7 3.3*   Recent  Labs  Lab 03/04/24 1552 03/07/24 1213  LIPASE 60 51  AMYLASE 156*  --    No results for input(s): AMMONIA in the last 168 hours. Diabetic: No results for input(s): HGBA1C in the last 72 hours. No results for input(s): GLUCAP in the last 168 hours. Cardiac Enzymes: No results for input(s): CKTOTAL, CKMB, CKMBINDEX, TROPONINI in the last 168 hours. No results for input(s): PROBNP in the last 8760 hours. Coagulation Profile: Recent Labs  Lab 03/04/24 1513 03/07/24 1247 03/08/24 0235  INR 3.0* 2.3* 2.4*   Thyroid  Function Tests: Recent Labs    03/07/24 1815 03/07/24 2217  TSH  --  3.751  FREET4  --  1.02  T3FREE 2.2  --    Lipid Profile: No results for input(s): CHOL, HDL, LDLCALC, TRIG, CHOLHDL, LDLDIRECT in the last 72 hours. Anemia Panel: Recent Labs    03/07/24 2217  FERRITIN 143   Urine analysis:    Component Value Date/Time   COLORURINE YELLOW 03/07/2024 1202   APPEARANCEUR CLEAR 03/07/2024 1202   APPEARANCEUR Clear 05/16/2023 0849   LABSPEC 1.007 03/07/2024 1202   LABSPEC 1.005 05/24/2014 1157   PHURINE 6.0 03/07/2024 1202   GLUCOSEU >=500 (A) 03/07/2024 1202   GLUCOSEU Negative 05/24/2014 1157   HGBUR NEGATIVE 03/07/2024 1202   BILIRUBINUR NEGATIVE 03/07/2024 1202   BILIRUBINUR Negative 05/16/2023 0849   BILIRUBINUR Negative 05/24/2014 1157   KETONESUR NEGATIVE 03/07/2024 1202   PROTEINUR NEGATIVE 03/07/2024 1202   NITRITE NEGATIVE 03/07/2024 1202   LEUKOCYTESUR NEGATIVE 03/07/2024 1202   LEUKOCYTESUR Negative  05/24/2014 1157   Sepsis Labs: Invalid input(s): PROCALCITONIN, LACTICIDVEN  Microbiology: No results found for this or any previous visit (from the past 240 hours).  Radiology Studies: US  Abdomen Limited RUQ (LIVER/GB) Result Date: 03/07/2024 CLINICAL DATA:  Right upper quadrant abdominal pain EXAM: ULTRASOUND ABDOMEN LIMITED RIGHT UPPER QUADRANT COMPARISON:  03/05/2024, 10/22/2023 FINDINGS: Gallbladder: No gallstones or wall thickening visualized. No sonographic Murphy sign noted by sonographer. Common bile duct: Diameter: 2 mm Liver: Nonspecific increased liver cortical echotexture. Simple 1.4 cm subcapsular right lobe liver cyst does not require imaging follow-up. No other focal liver abnormalities or intrahepatic biliary duct dilation. Portal vein is patent on color Doppler imaging with normal direction of blood flow towards the liver. Other: There is a 4.1 x 2.8 x 3.8 cm rounded hypoechoic area measured within the anterior aspect of the right kidney by the sonographer. In comparison to the preceding CT exam, this appears to reflect lobular configuration of the right kidney due to extensive areas of right renal cortical scarring. This is incompletely evaluated on this study, and dedicated renal ultrasound or renal CT/MRI may be useful. IMPRESSION: 1. Nonspecific increased liver echotexture, compatible with intrinsic liver disease. This could correspond to the suspected hemochromatosis seen on prior CT exam. 2. Lobular appearance of the right kidney as above, likely reflecting significant right renal cortical scarring. Dedicated imaging of the right kidney may be useful to exclude underlying mass. Electronically Signed   By: Ozell Daring M.D.   On: 03/07/2024 16:13    Scheduled Meds:  allopurinol   300 mg Oral Daily   amiodarone   400 mg Oral Daily   buPROPion   300 mg Oral Daily   feeding supplement  237 mL Oral BID BM   mexiletine  150 mg Oral TID   nortriptyline   10 mg Oral QHS   [START  ON 03/09/2024] rosuvastatin   40 mg Oral Daily   warfarin  2 mg Oral q1600  Warfarin - Physician Dosing Inpatient   Does not apply q1600   Continuous Infusions:   LOS: 1 day   35 minutes with more than 50% spent in reviewing records, counseling patient/family and coordinating care.  [name], MD [facility] [email or web address] 03/08/2024, 2:42 PM

## 2024-03-09 ENCOUNTER — Ambulatory Visit: Payer: Self-pay | Admitting: Family Medicine

## 2024-03-09 DIAGNOSIS — R634 Abnormal weight loss: Secondary | ICD-10-CM | POA: Diagnosis not present

## 2024-03-09 LAB — COMPREHENSIVE METABOLIC PANEL WITH GFR
ALT: 51 U/L — ABNORMAL HIGH (ref 0–44)
AST: 56 U/L — ABNORMAL HIGH (ref 15–41)
Albumin: 3 g/dL — ABNORMAL LOW (ref 3.5–5.0)
Alkaline Phosphatase: 46 U/L (ref 38–126)
Anion gap: 9 (ref 5–15)
BUN: 25 mg/dL — ABNORMAL HIGH (ref 8–23)
CO2: 24 mmol/L (ref 22–32)
Calcium: 9.4 mg/dL (ref 8.9–10.3)
Chloride: 109 mmol/L (ref 98–111)
Creatinine, Ser: 1.39 mg/dL — ABNORMAL HIGH (ref 0.61–1.24)
GFR, Estimated: 55 mL/min — ABNORMAL LOW (ref >60)
Glucose, Bld: 101 mg/dL — ABNORMAL HIGH (ref 70–99)
Potassium: 3.7 mmol/L (ref 3.5–5.1)
Sodium: 142 mmol/L (ref 135–145)
Total Bilirubin: 0.6 mg/dL (ref 0.0–1.2)
Total Protein: 6.3 g/dL — ABNORMAL LOW (ref 6.5–8.1)

## 2024-03-09 LAB — CBC
HCT: 42.8 % (ref 39.0–52.0)
Hemoglobin: 13.5 g/dL (ref 13.0–17.0)
MCH: 28.6 pg (ref 26.0–34.0)
MCHC: 31.5 g/dL (ref 30.0–36.0)
MCV: 90.7 fL (ref 80.0–100.0)
Platelets: 212 K/uL (ref 150–400)
RBC: 4.72 MIL/uL (ref 4.22–5.81)
RDW: 15.5 % (ref 11.5–15.5)
WBC: 9.4 K/uL (ref 4.0–10.5)
nRBC: 0 % (ref 0.0–0.2)

## 2024-03-09 LAB — PROTIME-INR
INR: 2.7 — ABNORMAL HIGH (ref 0.8–1.2)
Prothrombin Time: 30.2 s — ABNORMAL HIGH (ref 11.4–15.2)

## 2024-03-09 MED ORDER — ALLOPURINOL 100 MG PO TABS
100.0000 mg | ORAL_TABLET | Freq: Every day | ORAL | Status: DC
  Administered 2024-03-09: 100 mg via ORAL
  Filled 2024-03-09: qty 1

## 2024-03-09 MED ORDER — ALLOPURINOL 100 MG PO TABS
100.0000 mg | ORAL_TABLET | Freq: Every day | ORAL | 0 refills | Status: DC
Start: 2024-03-10 — End: 2024-05-15

## 2024-03-09 NOTE — Progress Notes (Signed)
 DISCHARGE NOTE HOME Jacobey Tomassetti to be discharged Home per MD order. Discussed prescriptions and follow up appointments with the patient. Prescriptions given to patient; medication list explained in detail. Patient verbalized understanding.  Skin clean, dry and intact without evidence of skin break down, no evidence of skin tears noted. IV catheter discontinued intact. Site without signs and symptoms of complications. Dressing and pressure applied. Pt denies pain at the site currently. No complaints noted.  Patient free of lines, drains, and wounds.   An After Visit Summary (AVS) was printed and given to the patient. Patient escorted via wheelchair, and discharged home via private auto.  Peyton SHAUNNA Pepper, RN

## 2024-03-09 NOTE — Discharge Summary (Signed)
 Physician Discharge Summary   Patient: Roberto [name] MRN: [id number] DOB: 1955-12-15  Admit date:     03/07/2024  Discharge date: 03/09/24  Discharge Physician: [name]   PCP: [name], DO   Recommendations at discharge:   Patient admitted for notable dehydration on exam.  Initial culprit deemed to be present because he has been unable to hold down any food due to dry heaving for weeks if not months. Recommended to completely stop the Ozempic .  Would strongly suggest no further GLP-1's in the future because of his inability to tolerate this. Held Farxiga  on discharge because of his dehydration.  Should discuss with PCP/cardiologist at upcoming appointment so I restarted this. Allopurinol  decreased from 300 mg down to 100 mg based on renal function. Patient recommended to hold his Aldactone  until he can see cardiologist/PCP in the next several weeks.  We will continue his torsemide  but hold spironolactone  prevent any further dehydration as he is recovering from this episode. Recommend follow-up with PCP and cardiologist in the next 1 to 2 weeks.  Should have renal function testing.  Of note his GFR/creatinine were the best they have been since February of this year holding all of his potentially nephrotoxic medications. Also recommended follow-up in the next month or so with outpatient gastroenterology due to persistent transaminitis for the majority of this year.  CT and ultrasound obtained in house and showed some increased liver echogenicity.  Possibly secondary to ongoing use of allopurinol  at higher dose than recommended based on his GFR, this was decreased to 100 mg as above on discharge.  Discharge Diagnoses: Principal Problem:   Unintentional weight loss  Resolved Problems:   * No resolved hospital problems. *  Hospital Course: 68 y.o. male with medical history significant for nonobstructive CAD, CHF with EF of 35%, AICD placement, OSA, aortic valve replacement and  atrial fibrillation on Coumadin , type 2 diabetes mellitus with peripheral neuropathy who presents several weeks to months of of not being able to eat anything besides a milkshake.  Down almost 20 pounds in the past 1 to 2 weeks.  Recent hospitalization at [facility] from August 2 through February 07, 2024 where he had an AICD upgrade.  He was discharged on continued mexiletine and higher doses of amiodarone . He is maintained on torsemide  twice daily, spironolactone  25 mg daily Entresto, Farxiga .  Toprol  XL was started at the time of discharge while midodrine  was started 5 days ago by his PCP for a systolic blood pressure of 79 in the office.    Admitted for profound dehydration.  He received 1 L normal saline in the emergency department and 500 cc bolus after being admitted.  Reported being able to make saliva for the first time in months status post fluid bolus.  Able to tolerate advanced diet here in the hospital, also for the first time in some time.  Initial dehydration deemed secondary to inability to tolerate food, dry heaves in light of ongoing GLP-1 use with Farxiga , Aldactone , torsemide  on board.  Due to his degree of improvement and fact he could tolerate p.o. so well he was discharged home on 03/09/2024 with the above instructions.   Assessment & Plan: Dehydration: - secondary to GLP-1 usage..   - Ozempic  DC'ed on admission - Feels much better today after 1 0.5 L fluid bolus.  No further abdominal pain.  Eating and drinking everything put in front of him. - Working diagnosis is severe dehydration secondary to ozempic  usage (pharmacy reports weekly dosage  as 4mg , much higher than usual high dosage of 2 mg weekly).  Pt also on daily dapagliflozin  throughout this entire course, likely worsening symptoms of dehydration.  This was continued on admission, stopped after admission.   AKI on CKD Stage IIIb: - Creatinine today but states been since February of this year. - stopping farxiga /ozempic  as above   H/o  VT and AICD placement.   Patient recently had recurrent V. tach and was hospitalized to [facility]. He is status post upgrade of his AICD - stable - Continue mexiletine and amiodarone  - Keep mg>2 and k>4   Atrial fib and mechanical aortic valve Continue Coumadin    CHF EF= 15%  Volume depleted on admission, much improved today. Follow outpatient with cardiology.   DM T2: -  Hold Ozempic  and farxiga .  Recommend stopping Ozempic  for good (he tells me his inability to eat has been ongoing for months while using this) - would also recommend holding SGLT-2 until follow-up with PCP/cardiology.   Hypotension: -Metoprolol  was started last month at [facility].  Midodrine  was started 5 days ago. Metoprolol  held on admission.  - BP better this AM with systolics in 100s.  I think this was all secondary to the medications he was on plus GLP-1 and inability to tolerate p.o. outpatient.  His blood pressure has been much better here even off of midodrine    Transaminitis:  - likely multifactorial--> he has been on 300 mg allopurinol  which could contribute to this.  We have decreased that to 100 mg daily based on his renal function. - present since Feb of this year - CT/US  both show some increased liver echotexture, concerning for possible hemochromatosis - will defer further workup to outpt GI       Disposition: Home Diet recommendation:  Regular diet DISCHARGE MEDICATION: Allergies as of 03/09/2024       Reactions   Nicotine  Hives, Rash   Patches caused localized rash and hives         Medication List     STOP taking these medications    dapagliflozin  propanediol 10 MG Tabs tablet Commonly known as: Farxiga    Ozempic  (1 MG/DOSE) 4 MG/3ML Sopn Generic drug: Semaglutide  (1 MG/DOSE)   potassium chloride  SA 20 MEQ tablet Commonly known as: KLOR-CON  M   spironolactone  25 MG tablet Commonly known as: ALDACTONE        TAKE these medications    allopurinol  100 MG tablet Commonly known as:  ZYLOPRIM  Take 1 tablet (100 mg total) by mouth daily. Start taking on: March 10, 2024 What changed:  medication strength how much to take   amiodarone  400 MG tablet Commonly known as: PACERONE  Take 1 tablet (400 mg total) by mouth Three (3) times a day with a meal for 5 days, THEN 1 tablet (400 mg total) daily. What changed:  how much to take how to take this when to take this additional instructions   baclofen  10 MG tablet Commonly known as: LIORESAL  Take 0.5-1 tablets (5-10 mg total) by mouth at bedtime as needed.   buPROPion  300 MG 24 hr tablet Commonly known as: WELLBUTRIN  XL Take 300 mg by mouth daily.   fenofibrate  48 MG tablet Commonly known as: TRICOR  Take 48 mg by mouth daily.   metFORMIN  500 MG 24 hr tablet Commonly known as: GLUCOPHAGE -XR Take 1 tablet by mouth once daily with breakfast   metoprolol  succinate 100 MG 24 hr tablet Commonly known as: TOPROL -XL Take 200 mg by mouth daily.   mexiletine 150 MG capsule  Commonly known as: MEXITIL  Take 150 mg by mouth 3 (three) times daily.   midodrine  2.5 MG tablet Commonly known as: PROAMATINE  Take 1 tablet (2.5 mg total) by mouth 2 (two) times daily with a meal.   nortriptyline  10 MG capsule Commonly known as: PAMELOR  Take 1 capsule by mouth at bedtime   OMEGA 3 PO Take 1,500 mg by mouth 2 (two) times daily.   rosuvastatin  40 MG tablet Commonly known as: CRESTOR  Take 1 tablet (40 mg total) by mouth daily.   Spacer/Aero-Holding [employer] Use as directed Dx: COPD, J44.9   torsemide  20 MG tablet Commonly known as: DEMADEX  Take 1 tablet (20 mg total) by mouth daily.   Trelegy Ellipta  200-62.5-25 MCG/ACT Aepb Generic drug: Fluticasone -Umeclidin-Vilant Inhale 1 puff into the lungs daily in the afternoon.   Ventolin  HFA 108 (90 Base) MCG/ACT inhaler Generic drug: albuterol  INHALE 2 PUFFS BY MOUTH EVERY 6 HOURS AS NEEDED FOR WHEEZING OR SHORTNESS OF BREATH   albuterol  (2.5 MG/3ML) 0.083%  nebulizer solution Commonly known as: PROVENTIL  USE 1 VIAL IN NEBULIZER EVERY 6 HOURS AS NEEDED FOR WHEEZING FOR SHORTNESS OF BREATH   warfarin 1 MG tablet Commonly known as: COUMADIN  Take as directed. If you are unsure how to take this medication, talk to your nurse or doctor. Original instructions: Take 1 tablet (1 mg total) by mouth daily. What changed: how much to take        Discharge Exam: Filed Weights   03/07/24 1114 03/07/24 2156 03/09/24 0344  Weight: 107 kg 110.7 kg 111.8 kg   Gen: 68 y.o. male in no apparent distress.  Nontoxic, much better color today than yesterday and overall just looks better today. HEENT:  PERRL, mucous membranes moist today. Pulm: Non-labored breathing.  Clear to auscultation bilaterally.  CV: Regular rate and rhythm. GI: Abdomen soft, non-tender, non-distended Ext: Warm, no deformities, no pedal edema Skin: No rashes, lesions  Neuro: Alert and oriented. No focal neurological deficits. Psych: Calm  Judgement and insight appear normal. Mood & affect appropriate  Condition at discharge: Good  The results of significant diagnostics from this hospitalization (including imaging, microbiology, ancillary and laboratory) are listed below for reference.   Imaging Studies: US  Abdomen Limited RUQ (LIVER/GB) Result Date: 03/07/2024 CLINICAL DATA:  Right upper quadrant abdominal pain EXAM: ULTRASOUND ABDOMEN LIMITED RIGHT UPPER QUADRANT COMPARISON:  03/05/2024, 10/22/2023 FINDINGS: Gallbladder: No gallstones or wall thickening visualized. No sonographic Murphy sign noted by sonographer. Common bile duct: Diameter: 2 mm Liver: Nonspecific increased liver cortical echotexture. Simple 1.4 cm subcapsular right lobe liver cyst does not require imaging follow-up. No other focal liver abnormalities or intrahepatic biliary duct dilation. Portal vein is patent on color Doppler imaging with normal direction of blood flow towards the liver. Other: There is a 4.1 x 2.8 x  3.8 cm rounded hypoechoic area measured within the anterior aspect of the right kidney by the sonographer. In comparison to the preceding CT exam, this appears to reflect lobular configuration of the right kidney due to extensive areas of right renal cortical scarring. This is incompletely evaluated on this study, and dedicated renal ultrasound or renal CT/MRI may be useful. IMPRESSION: 1. Nonspecific increased liver echotexture, compatible with intrinsic liver disease. This could correspond to the suspected hemochromatosis seen on prior CT exam. 2. Lobular appearance of the right kidney as above, likely reflecting significant right renal cortical scarring. Dedicated imaging of the right kidney may be useful to exclude underlying mass. Electronically Signed   By: Ozell  Delores M.D.   On: 03/07/2024 16:13   CT ABDOMEN PELVIS WO CONTRAST Result Date: 03/05/2024 CLINICAL DATA:  Early satiety.  Lower abdominal pain. EXAM: CT ABDOMEN AND PELVIS WITHOUT CONTRAST TECHNIQUE: Multidetector CT imaging of the abdomen and pelvis was performed following the standard protocol without IV contrast. RADIATION DOSE REDUCTION: This exam was performed according to the departmental dose-optimization program which includes automated exposure control, adjustment of the mA and/or kV according to patient size and/or use of iterative reconstruction technique. COMPARISON:  05/01/2016 FINDINGS: Lower chest: Aortic valve prosthesis visible on the scout image. AICD leads noted. Mild to moderate cardiomegaly. Faint clustered tree-in-bud nodularity in the right lower lobe as on image 4 series 3, favoring atypical infectious bronchiolitis. Hepatobiliary: Accentuated density in the liver, parenchyma measuring at 106 Hounsfield units on this noncontrast exam, raising the possibility of hemochromatosis. This represents a substantial change from 05/01/2016. Fluid density hepatic lesions favoring cysts. Some of the lesions are technically too small  to characterize. Dependent density in the gallbladder is indistinct and may be from sludge or gallstones. Pancreas: Unremarkable Spleen: Unremarkable Adrenals/Urinary Tract: Scattered scarring in the right kidney. The left kidney appears unremarkable. Both adrenal glands and the urinary bladder appear normal. Stomach/Bowel: Unremarkable Vascular/Lymphatic: Atherosclerosis is present, including aortoiliac atherosclerotic disease. Reproductive: Unremarkable Other: No supplemental non-categorized findings. Musculoskeletal: Grade 1 degenerative anterolisthesis at L4-5 and grade 1 degenerative retrolisthesis at L5-S1. Bilateral foraminal impingement at L5-S1 due to facet arthropathy, intervertebral spurring, and a suspected nitrogen gas filled disc protrusion in the right neural foramen. IMPRESSION: 1. Accentuated density in the liver, parenchyma measuring at 106 Hounsfield units on this noncontrast exam, raising the possibility of hemochromatosis. 2. Faint clustered tree-in-bud nodularity in the right lower lobe, favoring atypical infectious bronchiolitis. 3. Mild to moderate cardiomegaly. 4. Dependent density in the gallbladder is indistinct and may be from sludge or gallstones. 5. Bilateral foraminal impingement at L5-S1 due to facet arthropathy, intervertebral spurring, and a suspected nitrogen gas filled disc protrusion in the right neural foramen. 6.  Aortic Atherosclerosis (ICD10-I70.0). Electronically Signed   By: Ryan Salvage M.D.   On: 03/05/2024 12:52   DG Chest 2 View Result Date: 02/08/2024 CLINICAL DATA:  Recent pacemaker placement.  Falls. EXAM: CHEST - 2 VIEW COMPARISON:  09/10/2023 FINDINGS: Lateral view degraded by patient arm position. Replacement of pacer/ICD with right atrial and ventricular leads. No lead discontinuity. Midline trachea. Moderate cardiomegaly. Tortuous thoracic aorta. Median sternotomy for aortic valve repair. No pleural effusion or pneumothorax. No congestive failure.  IMPRESSION: 1. No acute cardiopulmonary disease. 2. Cardiomegaly without congestive failure. Electronically Signed   By: Rockey Kilts M.D.   On: 02/08/2024 19:19    Microbiology: Results for orders placed or performed during the hospital encounter of 08/21/23  Resp Panel by RT-PCR (Flu A&B, Covid) Anterior Nasal Swab     Status: None   Collection Time: 08/21/23  6:17 PM   Specimen: Anterior Nasal Swab  Result Value Ref Range Status   SARS Coronavirus 2 by RT PCR NEGATIVE NEGATIVE Final    Comment: (NOTE) SARS-CoV-2 target nucleic acids are NOT DETECTED.  The SARS-CoV-2 RNA is generally detectable in upper respiratory specimens during the acute phase of infection. The lowest concentration of SARS-CoV-2 viral copies this assay can detect is 138 copies/mL. A negative result does not preclude SARS-Cov-2 infection and should not be used as the sole basis for treatment or other patient management decisions. A negative result may occur with  improper specimen collection/handling, submission of  specimen other than nasopharyngeal swab, presence of viral mutation(s) within the areas targeted by this assay, and inadequate number of viral copies(<138 copies/mL). A negative result must be combined with clinical observations, patient history, and epidemiological information. The expected result is Negative.  Fact Sheet for Patients:  BloggerCourse.com  Fact Sheet for Healthcare Providers:  SeriousBroker.it  This test is no t yet approved or cleared by the [place]  FDA and  has been authorized for detection and/or diagnosis of SARS-CoV-2 by FDA under an Emergency Use Authorization (EUA). This EUA will remain  in effect (meaning this test can be used) for the duration of the COVID-19 declaration under Section 564(b)(1) of the Act, 21 U.S.C.section 360bbb-3(b)(1), unless the authorization is terminated  or revoked sooner.       Influenza  A by PCR NEGATIVE NEGATIVE Final   Influenza B by PCR NEGATIVE NEGATIVE Final    Comment: (NOTE) The Xpert Xpress SARS-CoV-2/FLU/RSV plus assay is intended as an aid in the diagnosis of influenza from Nasopharyngeal swab specimens and should not be used as a sole basis for treatment. Nasal washings and aspirates are unacceptable for Xpert Xpress SARS-CoV-2/FLU/RSV testing.  Fact Sheet for Patients: BloggerCourse.com  Fact Sheet for Healthcare Providers: SeriousBroker.it  This test is not yet approved or cleared by the [place]  FDA and has been authorized for detection and/or diagnosis of SARS-CoV-2 by FDA under an Emergency Use Authorization (EUA). This EUA will remain in effect (meaning this test can be used) for the duration of the COVID-19 declaration under Section 564(b)(1) of the Act, 21 U.S.C. section 360bbb-3(b)(1), unless the authorization is terminated or revoked.  Performed at [facility], [address] [place], [place] [address]    *Note: Due to a large number of results and/or encounters for the requested time period, some results have not been displayed. A complete set of results can be found in Results Review.    Labs: CBC: Recent Labs  Lab 03/04/24 1552 03/07/24 1213 03/08/24 1208 03/09/24 0220  WBC 15.3* 11.1* 9.3 9.4  NEUTROABS 11.7*  --   --   --   HGB 15.8 15.5 14.7 13.5  HCT 50.9 48.8 47.9 42.8  MCV 92 90.9 92.3 90.7  PLT 267 254 254 212   Basic Metabolic Panel: Recent Labs  Lab 03/04/24 1552 03/07/24 1213 03/08/24 0235 03/08/24 1208 03/09/24 0220  NA 142 140  --  142 142  K 4.2 3.5  --  3.6 3.7  CL 100 104  --  105 109  CO2 24 20*  --  24 24  GLUCOSE 93 142*  --  103* 101*  BUN 33* 35*  --  31* 25*  CREATININE 2.85* 2.49*  --  1.98* 1.39*  CALCIUM  10.3* 9.8  --  9.4 9.4  MG  --   --  2.2  --   --    Liver Function Tests: Recent Labs  Lab 03/04/24 1552  03/07/24 1213 03/08/24 1208 03/09/24 0220  AST 57* 63* 63* 56*  ALT 61* 58* 54* 51*  ALKPHOS 78 59 57 46  BILITOT 0.4 0.8 0.6 0.6  PROT 7.3 7.7 6.9 6.3*  ALBUMIN 4.5 3.7 3.3* 3.0*   CBG: No results for input(s): GLUCAP in the last 168 hours.  Discharge time spent: Less than 30 minutes.  Signed: [name], MD [facility] 03/09/2024

## 2024-03-10 ENCOUNTER — Encounter: Payer: Self-pay | Admitting: Family Medicine

## 2024-03-10 ENCOUNTER — Other Ambulatory Visit: Payer: Self-pay | Admitting: Family Medicine

## 2024-03-10 ENCOUNTER — Telehealth: Payer: Self-pay | Admitting: Pediatrics

## 2024-03-10 DIAGNOSIS — R932 Abnormal findings on diagnostic imaging of liver and biliary tract: Secondary | ICD-10-CM

## 2024-03-10 NOTE — Telephone Encounter (Signed)
 Per [facility] the equipment has been shipped to the patient

## 2024-03-10 NOTE — Telephone Encounter (Signed)
 Received a call from patients wife stating CAT scan found abnormality, is requesting fu phone call to discuss further scheduling and discuss. Please review and advise  Thank you

## 2024-03-11 ENCOUNTER — Telehealth: Payer: Self-pay

## 2024-03-11 ENCOUNTER — Encounter: Payer: Self-pay | Admitting: Family Medicine

## 2024-03-11 ENCOUNTER — Telehealth: Payer: Self-pay | Admitting: Pediatrics

## 2024-03-11 NOTE — Telephone Encounter (Signed)
 Duplicate Telephone encounter. Error.

## 2024-03-11 NOTE — Telephone Encounter (Signed)
 Spoke with [name]. [name] states that she will be off for the next 2 weeks and requested an appt for patient within that timeframe. Patient has been scheduled for a follow up with Dr. Suzann on Monday, 03/24/24 at 3:40 pm. [name] verbalized understanding and had no concerns at the end of the call.

## 2024-03-11 NOTE — Telephone Encounter (Signed)
-----   Message from [facility] sent at 03/11/2024  7:23 AM EDT ----- Regarding: TOC call before 5 pm today Hi there,  Please complete a TOC call for patient.   Reason: Acute kidney failure, unspecified Discharged: 03/09/2024 Location: [facility]. [facility] - Inpatient  3 call attempts need to be completed and documented no later than 5:00 pm. Any concerns you may not get it done prior please reach out for an assist. Please also ensure patient is scheduled for hospital follow up/ov using reason as IP hospital follow up.    Thank you, Izetta

## 2024-03-11 NOTE — Transitions of Care (Post Inpatient/ED Visit) (Signed)
 03/11/2024  Name: Roberto [name] MRN: [id number] DOB: [birth year]  Today's TOC FU Call Status: Today's TOC FU Call Status:: Successful TOC FU Call Completed TOC FU Call Complete Date: 03/11/24 Patient's Name and Date of Birth confirmed.  Transition Care Management Follow-up Telephone Call Date of Discharge: 03/09/24 Discharge Facility: [name]) Type of Discharge: Inpatient Admission Primary Inpatient Discharge Diagnosis:: Unintentional weight loss How have you been since you were released from the hospital?: Same  Items Reviewed: Did you receive and understand the discharge instructions provided?: Yes Medications obtained,verified, and reconciled?: Yes (Medications Reviewed) Any new allergies since your discharge?: No Dietary orders reviewed?: Yes Type of Diet Ordered:: Soft foods Do you have support at home?: Yes People in Home [RPT]: significant other Name of Support/Comfort Primary Source: [name]  Medications Reviewed Today: Medications Reviewed Today     Reviewed by Kaleigh Spiegelman T, CMA (Certified Medical Assistant) on 03/11/24 at 1515  Med List Status: <None>   Medication Order Taking? Sig Documenting Provider Last Dose Status Informant  albuterol  (PROVENTIL ) (2.5 MG/3ML) 0.083% nebulizer solution [id number] Yes USE 1 VIAL IN NEBULIZER EVERY 6 HOURS AS NEEDED FOR WHEEZING FOR SHORTNESS OF BREATH [name], [name], DO  Active Family Member, Pharmacy Records  albuterol  (PROVENTIL ) (2.5 MG/3ML) 0.083% nebulizer solution 2.5 mg [id number]   Vicci Bouchard P, DO  Active   allopurinol  (ZYLOPRIM ) 100 MG tablet [id number] Yes Take 1 tablet (100 mg total) by mouth daily. [name], MD  Active   amiodarone  (PACERONE ) 400 MG tablet [id number] Yes Take 1 tablet (400 mg total) by mouth Three (3) times a day with a meal for 5 days, THEN 1 tablet (400 mg total) daily.  Patient taking differently: Take 400 mg by mouth daily.   [provider]  Active Family Member,  Pharmacy Records  baclofen  (LIORESAL ) 10 MG tablet [id number] Yes Take 0.5-1 tablets (5-10 mg total) by mouth at bedtime as needed. Vicci Bouchard SQUIBB, DO  Active Family Member, Pharmacy Records  buPROPion  (WELLBUTRIN  XL) 300 MG 24 hr tablet [id number] Yes Take 300 mg by mouth daily. [provider]  Active Family Member, Pharmacy Records           Med Note LAVADA PAULINA MARLA [place] Sep 27, 2023 10:02 AM)    fenofibrate  (TRICOR ) 48 MG tablet [id number]  Take 48 mg by mouth daily.  Patient not taking: Reported on 03/11/2024   [provider]  Active Family Member, Pharmacy Records  Fluticasone -Umeclidin-Vilant (TRELEGY ELLIPTA ) 200-62.5-25 MCG/ACT AEPB [id number] Yes Inhale 1 puff into the lungs daily in the afternoon. [name], MD  Active Family Member, Pharmacy Records  metFORMIN  (GLUCOPHAGE -XR) 500 MG 24 hr tablet [id number] Yes Take 1 tablet by mouth once daily with breakfast Vicci Bouchard SQUIBB, DO  Active Family Member, Pharmacy Records  metoprolol  succinate (TOPROL -XL) 100 MG 24 hr tablet [id number] Yes Take 200 mg by mouth daily. [provider]  Active Family Member, Pharmacy Records  mexiletine (MEXITIL ) 150 MG capsule [id number] Yes Take 150 mg by mouth 3 (three) times daily. [provider]  Active Family Member, Pharmacy Records  midodrine  (PROAMATINE ) 2.5 MG tablet [id number]  Take 1 tablet (2.5 mg total) by mouth 2 (two) times daily with a meal.  Patient not taking: Reported on 03/11/2024   Vicci Bouchard SQUIBB, DO  Active Family Member, Pharmacy Records  nortriptyline  (PAMELOR ) 10 MG capsule [id number] Yes Take 1 capsule by mouth at bedtime Vicci Bouchard SQUIBB, DO  Active Family  Member, Pharmacy Records  Omega-3 Fatty Acids (OMEGA 3 PO) [id number] Yes Take 1,500 mg by mouth 2 (two) times daily. [provider]  Active Family Member, Pharmacy Records  rosuvastatin  (CRESTOR ) 40 MG tablet [id number] Yes Take 1 tablet (40 mg total) by mouth daily. Mady Bruckner, MD  Active Family Member, Pharmacy Records  Spacer/Aero-Holding [place] [id number] Yes Use as directed Dx: COPD, J44.9 [name], DO  Active Family Member, Pharmacy Records  torsemide  (DEMADEX ) 20 MG tablet [id number] Yes Take 1 tablet (20 mg total) by mouth daily.  Patient taking differently: Take 20 mg by mouth daily. Taking 10mg    Donette Ellouise LABOR, FNP  Active Family Member, Pharmacy Records  VENTOLIN  HFA 108 [phone]) MCG/ACT inhaler [id number] Yes INHALE 2 PUFFS BY MOUTH EVERY 6 HOURS AS NEEDED FOR WHEEZING OR SHORTNESS OF BREATH [name], DO  Active Family Member, Pharmacy Records  warfarin (COUMADIN ) 1 MG tablet [id number] Yes Take 1 tablet (1 mg total) by mouth daily.  Patient taking differently: Take 2 mg by mouth daily.   [name], DO  Active Family Member, Pharmacy Records  Med List Note (Hagopian, Tyelisha L, CPhT 08/27/23 1743): DO NOT REFILL MEDICATIONS WITHOUT AN APPOINTMENT / Friend Ms. [name] assists with his medication            Home Care and Equipment/Supplies: Were Home Health Services Ordered?: No Any new equipment or medical supplies ordered?: No  Functional Questionnaire: Do you need assistance with bathing/showering or dressing?: No Do you need assistance with meal preparation?: No Do you need assistance with eating?: No Do you have difficulty maintaining continence: No Do you need assistance with getting out of bed/getting out of a chair/moving?: No Do you have difficulty managing or taking your medications?: Yes  Follow up appointments reviewed: PCP Follow-up appointment confirmed?: Yes Date of PCP follow-up appointment?: 03/14/24 Follow-up Provider: Dr. Vicci [facility] Follow-up appointment confirmed?: Yes Date of Specialist follow-up appointment?: 03/18/24 Follow-Up Specialty Provider:: Nephrologist Do you need transportation to your follow-up appointment?: No Do you understand care options if your  condition(s) worsen?: Yes-patient verbalized understanding    SIGNATURE: Michale Fogo, CMA

## 2024-03-11 NOTE — Telephone Encounter (Signed)
 I spoke on the telephone with Mr. Rampey's wife regarding recent CT and ultrasound results ordered by his PCP.  CTAP 03/05/2024-accentuated density of the liver raising possibility of hemochromatosis AUS 03/07/2024 -nonspecific increased echogenicity of the liver consistent with intrinsic liver disease that could be consistent with hemochromatosis  Noted that ultrasound performed May 2025 only showed fatty liver  Labs 03/2024 -ferritin 143, transferrin 257  Reviewed with Mrs. Bieri that the laboratory studies are not indicative of hemochromatosis.  Mr. Hodak has had mildly elevated liver enzymes and I made a note the last time he was in clinic that we would pursue a further evaluation for chronic hepatitides in the future.  Will request that our office schedule him for an office visit with me or an APP to obtain history germane to liver disease and laboratory evaluation for chronic hepatitides.

## 2024-03-12 ENCOUNTER — Encounter: Payer: Self-pay | Admitting: Family Medicine

## 2024-03-12 NOTE — Telephone Encounter (Signed)
 This encounter was created in error - please disregard.

## 2024-03-12 NOTE — Addendum Note (Signed)
 Addended by: Lanora Reveron M on: 03/12/2024 01:15 PM   Modules accepted: Orders, Level of Service

## 2024-03-14 ENCOUNTER — Ambulatory Visit: Admitting: Family Medicine

## 2024-03-14 ENCOUNTER — Encounter: Payer: Self-pay | Admitting: Family Medicine

## 2024-03-14 ENCOUNTER — Encounter

## 2024-03-14 DIAGNOSIS — G4733 Obstructive sleep apnea (adult) (pediatric): Secondary | ICD-10-CM

## 2024-03-14 DIAGNOSIS — R0602 Shortness of breath: Secondary | ICD-10-CM

## 2024-03-14 DIAGNOSIS — G4730 Sleep apnea, unspecified: Secondary | ICD-10-CM | POA: Diagnosis not present

## 2024-03-14 NOTE — Telephone Encounter (Signed)
 If there's any openings early in the week please use them, if not I'll try to find an appropriate place to double book

## 2024-03-17 ENCOUNTER — Ambulatory Visit: Admitting: Family Medicine

## 2024-03-17 VITALS — BP 83/56 | HR 83 | Temp 97.8°F | Ht 66.0 in | Wt 242.2 lb

## 2024-03-17 DIAGNOSIS — M1A9XX0 Chronic gout, unspecified, without tophus (tophi): Secondary | ICD-10-CM

## 2024-03-17 DIAGNOSIS — E861 Hypovolemia: Secondary | ICD-10-CM

## 2024-03-17 DIAGNOSIS — Z23 Encounter for immunization: Secondary | ICD-10-CM

## 2024-03-17 DIAGNOSIS — R791 Abnormal coagulation profile: Secondary | ICD-10-CM

## 2024-03-17 DIAGNOSIS — Z952 Presence of prosthetic heart valve: Secondary | ICD-10-CM

## 2024-03-17 DIAGNOSIS — R932 Abnormal findings on diagnostic imaging of liver and biliary tract: Secondary | ICD-10-CM | POA: Diagnosis not present

## 2024-03-17 LAB — COAGUCHEK XS/INR WAIVED
INR: 5.6 (ref 0.9–1.1)
Prothrombin Time: 67.4 s

## 2024-03-17 MED ORDER — TRAZODONE HCL 50 MG PO TABS
25.0000 mg | ORAL_TABLET | Freq: Every evening | ORAL | 3 refills | Status: DC | PRN
Start: 2024-03-17 — End: 2024-05-18

## 2024-03-17 NOTE — Progress Notes (Unsigned)
 BP (!) 83/56 (BP Location: Left Arm, Patient Position: Sitting, Cuff Size: Normal)   Pulse 83   Temp 97.8 F (36.6 C) (Oral)   Ht 5' 6 (1.676 m)   Wt 242 lb 3.2 oz (109.9 kg)   SpO2 (!) 75%   BMI 39.09 kg/m    Subjective:    Patient ID: Roberto [name], male    DOB: 1955-12-02, 68 y.o.   MRN: [id number]  HPI: Roberto [name] is a 68 y.o. male  Chief Complaint  Patient presents with   Hospitalization Follow-up   Transition of Care Hospital Follow up.   Hospital/Facility: [facility] D/C Physician: Dr. Elpidio D/C Date: 03/10/24  Records Requested: 03/17/24 Records Received: 03/17/24 Records Reviewed: 03/17/24  Diagnoses on Discharge: Unintentional weight loss   Date of interactive Contact within 48 hours of discharge: 03/11/24 Contact was through: phone  Date of 7 day or 14 day face-to-face visit:  03/17/24  within 7 days  Outpatient Encounter Medications as of 03/17/2024  Medication Sig   albuterol  (PROVENTIL ) (2.5 MG/3ML) 0.083% nebulizer solution USE 1 VIAL IN NEBULIZER EVERY 6 HOURS AS NEEDED FOR WHEEZING FOR SHORTNESS OF BREATH   allopurinol  (ZYLOPRIM ) 100 MG tablet Take 1 tablet (100 mg total) by mouth daily.   buPROPion  (WELLBUTRIN  XL) 300 MG 24 hr tablet Take 300 mg by mouth daily.   fenofibrate  (TRICOR ) 48 MG tablet Take 48 mg by mouth daily.   Fluticasone -Umeclidin-Vilant (TRELEGY ELLIPTA ) 200-62.5-25 MCG/ACT AEPB Inhale 1 puff into the lungs daily in the afternoon.   mexiletine (MEXITIL ) 150 MG capsule Take 150 mg by mouth 3 (three) times daily.   nortriptyline  (PAMELOR ) 10 MG capsule Take 1 capsule by mouth at bedtime   Omega-3 Fatty Acids (OMEGA 3 PO) Take 1,500 mg by mouth 2 (two) times daily.   rosuvastatin  (CRESTOR ) 40 MG tablet Take 1 tablet (40 mg total) by mouth daily.   Spacer/Aero-Holding [place] Use as directed Dx: COPD, J44.9   traZODone  (DESYREL ) 50 MG tablet Take 0.5 tablets (25 mg total) by mouth at bedtime as needed for sleep.   VENTOLIN  HFA 108  (90 Base) MCG/ACT inhaler INHALE 2 PUFFS BY MOUTH EVERY 6 HOURS AS NEEDED FOR WHEEZING OR SHORTNESS OF BREATH   warfarin (COUMADIN ) 1 MG tablet Take 1 tablet (1 mg total) by mouth daily. (Patient taking differently: Take 2 mg by mouth daily.)   [DISCONTINUED] amiodarone  (PACERONE ) 400 MG tablet Take 1 tablet (400 mg total) by mouth Three (3) times a day with a meal for 5 days, THEN 1 tablet (400 mg total) daily. (Patient taking differently: Take 400 mg by mouth daily.)   [DISCONTINUED] baclofen  (LIORESAL ) 10 MG tablet Take 0.5-1 tablets (5-10 mg total) by mouth at bedtime as needed.   [DISCONTINUED] metFORMIN  (GLUCOPHAGE -XR) 500 MG 24 hr tablet Take 1 tablet by mouth once daily with breakfast   [DISCONTINUED] metoprolol  succinate (TOPROL -XL) 100 MG 24 hr tablet Take 200 mg by mouth daily.   [DISCONTINUED] torsemide  (DEMADEX ) 20 MG tablet Take 1 tablet (20 mg total) by mouth daily. (Patient taking differently: Take 10 mg by mouth daily.)   midodrine  (PROAMATINE ) 2.5 MG tablet Take 1 tablet (2.5 mg total) by mouth 2 (two) times daily with a meal.   Facility-Administered Encounter Medications as of 03/17/2024  Medication   albuterol  (PROVENTIL ) (2.5 MG/3ML) 0.083% nebulizer solution 2.5 mg  Per Hospitalist: Hospital Course: 68 y.o. male with medical history significant for nonobstructive CAD, CHF with EF of 35%, AICD placement, OSA, aortic valve  replacement and atrial fibrillation on Coumadin , type 2 diabetes mellitus with peripheral neuropathy who presents several weeks to months of of not being able to eat anything besides a milkshake.  Down almost 20 pounds in the past 1 to 2 weeks.  Recent hospitalization at [facility] from August 2 through February 07, 2024 where he had an AICD upgrade.  He was discharged on continued mexiletine and higher doses of amiodarone . He is maintained on torsemide  twice daily, spironolactone  25 mg daily Entresto, Farxiga .  Toprol  XL was started at the time of discharge while midodrine   was started 5 days ago by his PCP for a systolic blood pressure of 79 in the office.     Admitted for profound dehydration.  He received 1 L normal saline in the emergency department and 500 cc bolus after being admitted.  Reported being able to make saliva for the first time in months status post fluid bolus.  Able to tolerate advanced diet here in the hospital, also for the first time in some time.  Initial dehydration deemed secondary to inability to tolerate food, dry heaves in light of ongoing GLP-1 use with Farxiga , Aldactone , torsemide  on board.  Due to his degree of improvement and fact he could tolerate p.o. so well he was discharged home on 03/09/2024 with the above instructions.   Assessment & Plan: Dehydration: - secondary to GLP-1 usage..   - Ozempic  DC'ed on admission - Feels much better today after 1 0.5 L fluid bolus.  No further abdominal pain.  Eating and drinking everything put in front of him. - Working diagnosis is severe dehydration secondary to ozempic  usage (pharmacy reports weekly dosage as 4mg , much higher than usual high dosage of 2 mg weekly).  Pt also on daily dapagliflozin  throughout this entire course, likely worsening symptoms of dehydration.  This was continued on admission, stopped after admission.   AKI on CKD Stage IIIb: - Creatinine today but states been since February of this year. - stopping farxiga /ozempic  as above   H/o VT and AICD placement.   Patient recently had recurrent V. tach and was hospitalized to [facility]. He is status post upgrade of his AICD - stable - Continue mexiletine and amiodarone  - Keep mg>2 and k>4   Atrial fib and mechanical aortic valve Continue Coumadin    CHF EF= 15%  Volume depleted on admission, much improved today. Follow outpatient with cardiology.   DM T2: -  Hold Ozempic  and farxiga .  Recommend stopping Ozempic  for good (he tells me his inability to eat has been ongoing for months while using this) - would also recommend  holding SGLT-2 until follow-up with PCP/cardiology.   Hypotension: -Metoprolol  was started last month at [facility]  Midodrine  was started 5 days ago. Metoprolol  held on admission.  - BP better this AM with systolics in 100s.  I think this was all secondary to the medications he was on plus GLP-1 and inability to tolerate p.o. outpatient.  His blood pressure has been much better here even off of midodrine    Transaminitis:  - likely multifactorial--> he has been on 300 mg allopurinol  which could contribute to this.  We have decreased that to 100 mg daily based on his renal function. - present since Feb of this year - CT/US  both show some increased liver echotexture, concerning for possible hemochromatosis - will defer further workup to outpt GI  Diagnostic Tests Reviewed  Disposition: Home  Consults: None  Discharge Instructions:  Patient admitted for notable dehydration on exam.  Initial  culprit deemed to be present because he has been unable to hold down any food due to dry heaving for weeks if not months. Recommended to completely stop the Ozempic .  Would strongly suggest no further GLP-1's in the future because of his inability to tolerate this. Held Farxiga  on discharge because of his dehydration.  Should discuss with PCP/cardiologist at upcoming appointment so I restarted this. Allopurinol  decreased from 300 mg down to 100 mg based on renal function. Patient recommended to hold his Aldactone  until he can see cardiologist/PCP in the next several weeks.  We will continue his torsemide  but hold spironolactone  prevent any further dehydration as he is recovering from this episode. Recommend follow-up with PCP and cardiologist in the next 1 to 2 weeks.  Should have renal function testing.  Of note his GFR/creatinine were the best they have been since February of this year holding all of his potentially nephrotoxic medications. Also recommended follow-up in the next month or so with outpatient  gastroenterology due to persistent transaminitis for the majority of this year.  CT and ultrasound obtained in house and showed some increased liver echogenicity.  Possibly secondary to ongoing use of allopurinol  at higher dose than recommended based on his GFR, this was decreased to 100 mg as above on discharge.  Disease/illness Education: Discussed today  Home Health/Community Services Discussions/Referrals: N/A  Establishment or re-establishment of referral orders for community resources: N/A  Discussion with other health care providers: None  Assessment and Support of treatment regimen adherence: Good  Appointments Coordinated with: Patient and friend  Education for self-management, independent living, and ADLs: Discussed today  Roberto [name] presents today for hospital follow up. He notes that he has been doing better since getting out of the hospital. He remains really tired and continues to have issues with eating and appetite.  Coumadin  Management.  The expected duration of coumadin  treatment is lifelong The reason for anticoagulation is  mechanical heart valve.  Present Coumadin  dose: 2mg  daily Goal: 2.5-3.5  Excessive bruising: no Nose bleeding: no Rectal bleeding: no Prolonged menstrual cycles: N/A Eating diet with consistent amounts of foods containing Vitamin K:no Any recent antibiotic use? no   Relevant past medical, surgical, family and social history reviewed and updated as indicated. Interim medical history since our last visit reviewed. Allergies and medications reviewed and updated.  Review of Systems  Constitutional: Negative.   Respiratory: Negative.    Cardiovascular: Negative.   Gastrointestinal: Negative.   Musculoskeletal: Negative.   Neurological:  Positive for dizziness. Negative for tremors, seizures, syncope, facial asymmetry, speech difficulty, weakness, light-headedness, numbness and headaches.  Psychiatric/Behavioral: Negative.      Per HPI unless  specifically indicated above     Objective:    BP (!) 83/56 (BP Location: Left Arm, Patient Position: Sitting, Cuff Size: Normal)   Pulse 83   Temp 97.8 F (36.6 C) (Oral)   Ht 5' 6 (1.676 m)   Wt 242 lb 3.2 oz (109.9 kg)   SpO2 (!) 75%   BMI 39.09 kg/m   Wt Readings from Last 3 Encounters:  03/20/24 239 lb 6.4 oz (108.6 kg)  03/19/24 240 lb (108.9 kg)  03/19/24 239 lb 9.6 oz (108.7 kg)    Physical Exam Vitals and nursing note reviewed.  Constitutional:      General: He is not in acute distress.    Appearance: Normal appearance. He is obese. He is not ill-appearing, toxic-appearing or diaphoretic.  HENT:     Head: Normocephalic and atraumatic.  Right Ear: External ear normal.     Left Ear: External ear normal.     Nose: Nose normal.     Mouth/Throat:     Mouth: Mucous membranes are moist.     Pharynx: Oropharynx is clear.  Eyes:     General: No scleral icterus.       Right eye: No discharge.        Left eye: No discharge.     Extraocular Movements: Extraocular movements intact.     Conjunctiva/sclera: Conjunctivae normal.     Pupils: Pupils are equal, round, and reactive to light.  Cardiovascular:     Rate and Rhythm: Normal rate and regular rhythm.     Pulses: Normal pulses.     Heart sounds: Normal heart sounds. No murmur heard.    No friction rub. No gallop.  Pulmonary:     Effort: Pulmonary effort is normal. No respiratory distress.     Breath sounds: Normal breath sounds. No stridor. No wheezing, rhonchi or rales.  Chest:     Chest wall: No tenderness.  Musculoskeletal:        General: Normal range of motion.     Cervical back: Normal range of motion and neck supple.     Right lower leg: No edema.     Left lower leg: No edema.  Skin:    General: Skin is warm and dry.     Capillary Refill: Capillary refill takes less than 2 seconds.     Coloration: Skin is not jaundiced or pale.     Findings: No bruising, erythema, lesion or rash.  Neurological:      General: No focal deficit present.     Mental Status: He is alert and oriented to person, place, and time. Mental status is at baseline.  Psychiatric:        Mood and Affect: Mood normal.        Behavior: Behavior normal.        Thought Content: Thought content normal.        Judgment: Judgment normal.     Results for orders placed or performed in visit on 03/17/24  CoaguChek XS/INR Waived   Collection Time: 03/17/24 11:03 AM  Result Value Ref Range   INR 5.6 (HH) 0.9 - 1.1   Prothrombin Time 67.4 sec  CBC with Differential/Platelet   Collection Time: 03/17/24 11:04 AM  Result Value Ref Range   WBC 9.7 3.4 - 10.8 x10E3/uL   RBC 4.91 4.14 - 5.80 x10E6/uL   Hemoglobin 14.0 13.0 - 17.7 g/dL   Hematocrit 54.6 62.4 - 51.0 %   MCV 92 79 - 97 fL   MCH 28.5 26.6 - 33.0 pg   MCHC 30.9 (L) 31.5 - 35.7 g/dL   RDW 85.2 88.3 - 84.5 %   Platelets 307 150 - 450 x10E3/uL   Neutrophils 75 Not Estab. %   Lymphs 12 Not Estab. %   Monocytes 9 Not Estab. %   Eos 2 Not Estab. %   Basos 1 Not Estab. %   Neutrophils Absolute 7.4 (H) 1.4 - 7.0 x10E3/uL   Lymphocytes Absolute 1.1 0.7 - 3.1 x10E3/uL   Monocytes Absolute 0.8 0.1 - 0.9 x10E3/uL   EOS (ABSOLUTE) 0.2 0.0 - 0.4 x10E3/uL   Basophils Absolute 0.1 0.0 - 0.2 x10E3/uL   Immature Granulocytes 1 Not Estab. %   Immature Grans (Abs) 0.1 0.0 - 0.1 x10E3/uL  Comprehensive metabolic panel with GFR   Collection Time: 03/17/24 11:04 AM  Result Value Ref Range   Glucose 103 (H) 70 - 99 mg/dL   BUN 25 8 - 27 mg/dL   Creatinine, Ser 8.02 (H) 0.76 - 1.27 mg/dL   eGFR 36 (L) >40 fO/fpw/8.26   BUN/Creatinine Ratio 13 10 - 24   Sodium 139 134 - 144 mmol/L   Potassium 3.1 (L) 3.5 - 5.2 mmol/L   Chloride 98 96 - 106 mmol/L   CO2 24 20 - 29 mmol/L   Calcium  9.4 8.6 - 10.2 mg/dL   Total Protein 6.5 6.0 - 8.5 g/dL   Albumin 3.8 (L) 3.9 - 4.9 g/dL   Globulin, Total 2.7 1.5 - 4.5 g/dL   Bilirubin Total 0.5 0.0 - 1.2 mg/dL   Alkaline Phosphatase 114 47  - 123 IU/L   AST 37 0 - 40 IU/L   ALT 44 0 - 44 IU/L  Ferritin   Collection Time: 03/17/24 11:04 AM  Result Value Ref Range   Ferritin 175 30 - 400 ng/mL  Uric acid   Collection Time: 03/17/24 11:04 AM  Result Value Ref Range   Uric Acid 5.6 3.8 - 8.4 mg/dL   *Note: Due to a large number of results and/or encounters for the requested time period, some results have not been displayed. A complete set of results can be found in Results Review.      Assessment & Plan:   Problem List Items Addressed This Visit       Cardiovascular and Mediastinum   Hypotension   Continue midodrine . BP still running quite low. Due to see cardiology this week. Encouraged fluids. Call with any concerns.       Relevant Orders   CBC with Differential/Platelet (Completed)     Other   H/O mechanical aortic valve replacement   See discussion under supratherapeutic INR.      Chronic gout without tophus   Rechecking labs today. Await results. Treat as needed.       Relevant Orders   Uric acid (Completed)   Other Visit Diagnoses       Supratherapeutic INR    -  Primary   High at 5.6 x2. No significant changes in the past week. ? Due to decreased intake. Will hold coumadin  tonight and restart 1mg /2mg  alternating tomorrow. Rchk Th   Relevant Orders   CBC with Differential/Platelet (Completed)   CoaguChek XS/INR Waived (Completed)     Abnormal finding on imaging of liver       Will check labs today. Due to see GI next week. Call with any concerns.   Relevant Orders   CBC with Differential/Platelet (Completed)   Comprehensive metabolic panel with GFR (Completed)   Ferritin (Completed)     Needs flu shot       Flu shot given today.   Relevant Orders   Flu vaccine HIGH DOSE PF(Fluzone Trivalent) (Completed)        Follow up plan: Return Thursday- OK to double book.

## 2024-03-18 ENCOUNTER — Other Ambulatory Visit: Payer: Self-pay | Admitting: Family Medicine

## 2024-03-18 DIAGNOSIS — I10 Essential (primary) hypertension: Secondary | ICD-10-CM | POA: Diagnosis not present

## 2024-03-18 DIAGNOSIS — N1832 Chronic kidney disease, stage 3b: Secondary | ICD-10-CM | POA: Diagnosis not present

## 2024-03-18 DIAGNOSIS — E1122 Type 2 diabetes mellitus with diabetic chronic kidney disease: Secondary | ICD-10-CM | POA: Diagnosis not present

## 2024-03-18 DIAGNOSIS — N2581 Secondary hyperparathyroidism of renal origin: Secondary | ICD-10-CM | POA: Diagnosis not present

## 2024-03-18 LAB — COMPREHENSIVE METABOLIC PANEL WITH GFR
ALT: 44 IU/L (ref 0–44)
AST: 37 IU/L (ref 0–40)
Albumin: 3.8 g/dL — ABNORMAL LOW (ref 3.9–4.9)
Alkaline Phosphatase: 114 IU/L (ref 47–123)
BUN/Creatinine Ratio: 13 (ref 10–24)
BUN: 25 mg/dL (ref 8–27)
Bilirubin Total: 0.5 mg/dL (ref 0.0–1.2)
CO2: 24 mmol/L (ref 20–29)
Calcium: 9.4 mg/dL (ref 8.6–10.2)
Chloride: 98 mmol/L (ref 96–106)
Creatinine, Ser: 1.97 mg/dL — ABNORMAL HIGH (ref 0.76–1.27)
Globulin, Total: 2.7 g/dL (ref 1.5–4.5)
Glucose: 103 mg/dL — ABNORMAL HIGH (ref 70–99)
Potassium: 3.1 mmol/L — ABNORMAL LOW (ref 3.5–5.2)
Sodium: 139 mmol/L (ref 134–144)
Total Protein: 6.5 g/dL (ref 6.0–8.5)
eGFR: 36 mL/min/1.73 — ABNORMAL LOW (ref >59)

## 2024-03-18 LAB — CBC WITH DIFFERENTIAL/PLATELET
Basophils Absolute: 0.1 x10E3/uL (ref 0.0–0.2)
Basos: 1 %
EOS (ABSOLUTE): 0.2 x10E3/uL (ref 0.0–0.4)
Eos: 2 %
Hematocrit: 45.3 % (ref 37.5–51.0)
Hemoglobin: 14 g/dL (ref 13.0–17.7)
Immature Grans (Abs): 0.1 x10E3/uL (ref 0.0–0.1)
Immature Granulocytes: 1 %
Lymphocytes Absolute: 1.1 x10E3/uL (ref 0.7–3.1)
Lymphs: 12 %
MCH: 28.5 pg (ref 26.6–33.0)
MCHC: 30.9 g/dL — ABNORMAL LOW (ref 31.5–35.7)
MCV: 92 fL (ref 79–97)
Monocytes Absolute: 0.8 x10E3/uL (ref 0.1–0.9)
Monocytes: 9 %
Neutrophils Absolute: 7.4 x10E3/uL — ABNORMAL HIGH (ref 1.4–7.0)
Neutrophils: 75 %
Platelets: 307 x10E3/uL (ref 150–450)
RBC: 4.91 x10E6/uL (ref 4.14–5.80)
RDW: 14.7 % (ref 11.6–15.4)
WBC: 9.7 x10E3/uL (ref 3.4–10.8)

## 2024-03-18 LAB — FERRITIN: Ferritin: 175 ng/mL (ref 30–400)

## 2024-03-18 LAB — URIC ACID: Uric Acid: 5.6 mg/dL (ref 3.8–8.4)

## 2024-03-18 NOTE — Progress Notes (Signed)
 Follow Up Visit   Patient Name: Roberto [name], male   Patient DOB: [birth year] Date of Service: 03/18/2024  Patient MRN: [id number] Provider Creating Note: Bonnell Sherry, MD  [phone] Primary Care Physician:   [address] [place] [address] Additional Physicians/ Providers:    History of Present Illness Roberto [name] is a 68 y.o. male who is following up today for diabetes mellitus type 2 with chronic kidney disease, chronic kidney disease stage IIIa, history of renal infarcts, and hypertension.  The patient is here for early follow-up.  His renal function is declining with most recent eGFR down to 36 whereas when we previously saw him eGFR was 46.  In regards to hypertension blood pressure also a bit low today at 81/60.  He also has known secondary hyperparathyroidism.  He is off of Farxiga  and Lasix  and transition to torsemide .   Medications   Current Outpatient Medications:  .  baclofen  (LIORESAL ) 10 MG tablet, Take 10 mg by mouth 1 (one) time each day, Disp: , Rfl:  .  mexiletine (MEXITIL ) 150 MG capsule, Take 150 mg by mouth 1 (one) time each day, Disp: , Rfl:  .  Pacerone  200 MG tablet, Take 200 mg by mouth in the morning and 200 mg in the evening., Disp: , Rfl:  .  torsemide  (DEMADEX ) 20 MG tablet, Take 40 mg by mouth, Disp: , Rfl:  .  albuterol  HFA (Ventolin  HFA) 108 (90 Base) MCG/ACT inhaler, Inhale 2 puffs every 6 (six) hours if needed, Disp: , Rfl:  .  allopurinol  (ZYLOPRIM ) 100 MG tablet, Take 100 mg by mouth 1 (one) time each day, Disp: , Rfl:  .  fenofibrate  (TRICOR ) 48 MG tablet, Take 1 tablet by mouth 1 (one) time each day, Disp: , Rfl:  .  Fluticasone -Umeclidin-Vilant (Trelegy Ellipta ) 100-62.5-25 MCG/INH aerosol powder , Inhale 1 puff, Disp: , Rfl:  .  lisinopril  5 MG tablet, Take 5 mg by mouth, Disp: , Rfl:  .  metFORMIN  XR (GLUCOPHAGE -XR) 500 MG 24 hr tablet, Take 500 mg by mouth, Disp: , Rfl:  .  metoprolol  succinate XL (TOPROL  XL) 50 MG 24 hr tablet, , Disp: ,  Rfl:  .  omega-3 (FISH OIL ) 1000 MG capsule, Take 5,000 mg by mouth, Disp: , Rfl:  .  rosuvastatin  (CRESTOR ) 40 MG tablet, Take 40 mg by mouth 1 (one) time each day, Disp: , Rfl:  .  warfarin (COUMADIN ) 1 MG tablet, TAKE TWO AND ONE-HALF TABLETS BY MOUTH ONCE DAILY, Disp: , Rfl:    Allergies Patient has no known allergies.  Problem List Patient Active Problem List  Diagnosis  . Chronic kidney disease  . Diabetes mellitus without mention of complication, type II or unspecified type, not stated as uncontrolled (HCC)  . Essential hypertension     Review of Systems  Constitutional:  Negative for chills and fever.  Respiratory:  Negative for cough and shortness of breath.   Cardiovascular:  Negative for chest pain and palpitations.  Gastrointestinal:  Negative for nausea and vomiting.  Genitourinary:  Negative for dysuria, hematuria and urgency.     History Past Medical History:  Diagnosis Date  . Chronic airway obstruction, not elsewhere classified (HCC)   . Chronic kidney disease   . Congestive heart failure (HCC)   . Coronary atherosclerosis of unspecified type of vessel, native or graft   . Diabetes mellitus without mention of complication, type II or unspecified type, not stated as uncontrolled (HCC)   . Essential hypertension   .  Gout   . Osteoarthrosis, unspecified whether generalized or localized, involving unspecified site   . Other and unspecified hyperlipidemia   . Transient cerebral ischemia     Past Surgical History:  Procedure Laterality Date  . AORTIC VALVE REPLACEMENT    . CARDIAC CATHETERIZATION    . CARDIAC CATHETERIZATION    . OTHER SURGICAL HISTORY    . TONSILLECTOMY     Family History  Problem Relation Age of Onset  . Cancer Sibling        brother (brain)  . Hypertension Mother   . Diabetes Father   . Cancer Mother        colon  . Hypertension Father   . Dementia Mother   . Gout Father   . Cancer Father        colon  . Stroke Father     Social History   Tobacco Use  . Smoking status: Former    Current packs/day: 0.00    Types: Cigarettes    Quit date: 05/08/2016    Years since quitting: 7.8  . Smokeless tobacco: Not on file  . Tobacco comments:    Smoking History Info:Every day  Substance Use Topics  . Alcohol use: No        Physical Exam  Vitals BP 81/60 (BP Location: Right upper arm, Patient Position: Sitting)   Pulse 72   Temp 97.3 F   Wt 239 lb (108 kg)   BMI 38.58 kg/m   PHYSICAL EXAM: General appearance: well developed, well nourished, NAD Eyes: anicteric sclerae, moist conjunctivae; no lid-lag  HENT: Atraumatic; hearing intact Neck: Trachea midline; supple Lungs: CTAB, with normal respiratory effort  CV: S1S2, mechanical click heard Abdomen: Soft, non-tender; bowel sounds present Extremities: Trace peripheral edema Skin: Warm and dry, normal skin turgor, no rashes noted. Psych: Appropriate affect, alert and oriented to person, place and time    Laboratory Studies  Chemistry  Lab Units 02/07/24 0910 02/06/24 9346 02/05/24 0358 02/04/24 0302 02/03/24 0224 02/02/24 1924 01/03/24 0946 08/27/23 0959 01/01/23 1005 08/29/22 0914 08/29/22 0914 07/31/22 1033  SODIUM mmol/L 140 140 147* 142 138 138 139 143 138   < > 136 137  POTASSIUM mmol/L 4.3 4.6 3.4 3.9 4.0 4.3 4.6 4.1 4.3   < > 4.3 4.9  CHLORIDE mmol/L 111* 109* 117* 107 100 99 103 103 102   < > 101 101  CO2 mmol/L 19.0* 22.0 22.0 29.0 28.0 23.8 27 30 28    < > 26 27.2  ANION GAP mmol/L 10 9 8 6 10  15*  --   --   --   --   --   --   MAGNESIUM  mg/dL 2.3 2.7* 1.7 2.5 2.5 2.6  --   --   --   --   --   --   CALCIUM  mg/dL 9.4 9.5 7.0* 9.4 9.4 9.8 10.1 9.6 9.7   < > 9.8 9.9  PHOSPHORUS mg/dL  --   --  2.3*  --   --   --  3.6 3.7 3.0  --  2.9  --   ALK PHOS U/L  --   --   --   --   --  60  --   --   --   --   --  44  PTH pg/mL  --   --   --   --   --   --  65 51 98*  --  77  --   GLUCOSE mg/dL  97 95 86 98 94 103 100* 98 106*   < > 133*  105  ALBUMIN g/dL  --   --   --   --   --  4.2 4.7 3.8 4.5  --  4.5 4.6  BUN mg/dL 17 21 24* 31* 43* 41* 29* 29* 29*   < > 35* 31*  CREATININE mg/dL 8.39* 8.40* 8.69* 8.19* 2.20* 2.32* 2.06* 1.54* 1.46*   < > 1.45* 1.5*  HEMOGLOBIN A1C % of total Hgb  --   --   --   --   --   --   --  6.5*  --   --   --   --    < > = values in this interval not displayed.        No lab exists for component: IRON SATURATION, TRANSSATPER  CBC  Lab Units 01/03/24 0946 08/27/23 0959 01/01/23 1005 08/29/22 0914  WBC AUTO Thousand/uL 8.5 11.8* 8.5 8.9  HEMOGLOBIN g/dL 86.0 86.4 85.1 85.1  HEMATOCRIT % 44.4 42.7 45.6 46.2  MCV fL 92.5 88.6 87.5 87.3  PLATELETS AUTO Thousand/uL 345 294 251 307    Urine  Lab Units 01/01/23 1005 08/29/22 0914  ALB MG/G CREAT UR mg/g creat 17 8    Lab Results  Component Value Date   PTH 65 01/03/2024   CALCIUM  9.4 02/07/2024   PHOS 2.3 (L) 02/05/2024     Imaging and Other Studies     Orders Placed This Encounter  . Renal Function Panel  . CBC and Differential  . PTH, Intact  . Protein, Total, Random Urine w/Creatinine (Protein/Creat Ratio)       Impression/Recommendations  Abdulrahman Fazzino is a 68 y.o. male with past medical history of congestive heart failure, history of mechanical aortic valve replacement, hypertension, hyperlipidemia, diabetes mellitus type 2, history of GI hemorrhage, history of right renal infarcts, history of permanent pacemaker placement who was referred the evaluation of chronic kidney disease stage IIIb.   1.  Diabetes mellitus type 2 with chronic kidney disease/chronic kidney disease stage IIIa/history of multiple right renal infarcts.  Patient had recent imaging of the abdomen pelvis with CT scan and also had limited ultrasound.  Imaging has confirmed that he has known right renal infarcts.  Most recent eGFR was down to 36 whereas in our office previously eGFR was 46.  Suspect that he may be overdiuresed.  I have advised him  to hold torsemide  and also advised him to discuss the possibility of a lower dose of torsemide  with cardiology.  He has an appointment with them tomorrow.  Repeat renal parameters today.  2.  Hypertension.  Blood pressure actually a bit low today at 81/60.  Torsemide  may be playing some role here.  Okay to continue lisinopril  metoprolol  and would consider lower dose of torsemide  but defer to cardiology.  3.  Secondary hyperparathyroidism.  Repeat PTH, phosphorus, calcium  level today.  No immediate need for calcitriol.  Return in about 4 months (around 07/18/2024).   Munsoor Lateef, MD

## 2024-03-19 ENCOUNTER — Ambulatory Visit: Payer: Self-pay | Admitting: Family Medicine

## 2024-03-19 ENCOUNTER — Encounter: Payer: Self-pay | Admitting: Internal Medicine

## 2024-03-19 ENCOUNTER — Encounter: Payer: Self-pay | Admitting: Cardiology

## 2024-03-19 ENCOUNTER — Ambulatory Visit (HOSPITAL_COMMUNITY): Payer: Self-pay | Admitting: Cardiology

## 2024-03-19 ENCOUNTER — Ambulatory Visit: Attending: Internal Medicine | Admitting: Internal Medicine

## 2024-03-19 ENCOUNTER — Encounter: Payer: Self-pay | Admitting: Family Medicine

## 2024-03-19 ENCOUNTER — Other Ambulatory Visit
Admission: RE | Admit: 2024-03-19 | Discharge: 2024-03-19 | Disposition: A | Discharge status: Home / Self Care | Source: Ambulatory Visit | Attending: Cardiology | Admitting: Cardiology

## 2024-03-19 ENCOUNTER — Ambulatory Visit (HOSPITAL_BASED_OUTPATIENT_CLINIC_OR_DEPARTMENT_OTHER): Admitting: Cardiology

## 2024-03-19 VITALS — BP 92/72 | HR 80 | Ht 66.0 in | Wt 239.6 lb

## 2024-03-19 VITALS — BP 81/69 | HR 79 | Wt 240.0 lb

## 2024-03-19 DIAGNOSIS — I5022 Chronic systolic (congestive) heart failure: Secondary | ICD-10-CM | POA: Insufficient documentation

## 2024-03-19 DIAGNOSIS — I4819 Other persistent atrial fibrillation: Secondary | ICD-10-CM

## 2024-03-19 DIAGNOSIS — Z952 Presence of prosthetic heart valve: Secondary | ICD-10-CM

## 2024-03-19 DIAGNOSIS — I2510 Atherosclerotic heart disease of native coronary artery without angina pectoris: Secondary | ICD-10-CM

## 2024-03-19 DIAGNOSIS — I7121 Aneurysm of the ascending aorta, without rupture: Secondary | ICD-10-CM | POA: Diagnosis not present

## 2024-03-19 DIAGNOSIS — I472 Ventricular tachycardia, unspecified: Secondary | ICD-10-CM

## 2024-03-19 LAB — CBC
HCT: 45.6 % (ref 39.0–52.0)
Hemoglobin: 14.8 g/dL (ref 13.0–17.0)
MCH: 28.7 pg (ref 26.0–34.0)
MCHC: 32.5 g/dL (ref 30.0–36.0)
MCV: 88.5 fL (ref 80.0–100.0)
Platelets: 341 K/uL (ref 150–400)
RBC: 5.15 MIL/uL (ref 4.22–5.81)
RDW: 15.9 % — ABNORMAL HIGH (ref 11.5–15.5)
WBC: 11.7 K/uL — ABNORMAL HIGH (ref 4.0–10.5)
nRBC: 0 % (ref 0.0–0.2)

## 2024-03-19 LAB — BASIC METABOLIC PANEL WITH GFR
Anion gap: 10 (ref 5–15)
BUN: 24 mg/dL — ABNORMAL HIGH (ref 8–23)
CO2: 26 mmol/L (ref 22–32)
Calcium: 9.2 mg/dL (ref 8.9–10.3)
Chloride: 102 mmol/L (ref 98–111)
Creatinine, Ser: 2.11 mg/dL — ABNORMAL HIGH (ref 0.61–1.24)
GFR, Estimated: 33 mL/min — ABNORMAL LOW (ref >60)
Glucose, Bld: 104 mg/dL — ABNORMAL HIGH (ref 70–99)
Potassium: 3.6 mmol/L (ref 3.5–5.1)
Sodium: 138 mmol/L (ref 135–145)

## 2024-03-19 LAB — BRAIN NATRIURETIC PEPTIDE: B Natriuretic Peptide: 773.9 pg/mL — ABNORMAL HIGH (ref 0.0–100.0)

## 2024-03-19 LAB — PROTIME-INR
INR: 3.2 — ABNORMAL HIGH (ref 0.8–1.2)
Prothrombin Time: 33.9 s — ABNORMAL HIGH (ref 11.4–15.2)

## 2024-03-19 MED ORDER — DAPAGLIFLOZIN PROPANEDIOL 10 MG PO TABS: 10.0000 mg | ORAL_TABLET | Freq: Every day | ORAL | 3 refills | Status: DC

## 2024-03-19 MED ORDER — METOPROLOL SUCCINATE ER 50 MG PO TB24: 50.0000 mg | ORAL_TABLET | Freq: Every day | ORAL | 4 refills | Status: DC

## 2024-03-19 NOTE — Patient Instructions (Addendum)
 Medication Changes:  DECREASE Metoprolol  to 50mg  (1 tab) daily  START Farxiga  tomorrow  Lab Work:  Go over to the [facility]. Go pass the gift shop and have your blood work completed.  We will only call you if the results are abnormal or if the provider would like to make medication changes.  No news is good news.   Testing/Procedures:   [facility] [address] [place] [address] Dept: [phone]  Roberto [name]  03/19/2024  You are scheduled for a Cardiac Catheterization on Friday, September 19 with Dr. Ezra Shuck.  1. Please arrive at the [facility] at 10:30 AM (This time is 2 hour(s) before your procedure to ensure your preparation).   Free valet parking service is available. You will check in at ADMITTING. The support person will be asked to wait in the waiting room.  It is OK to have someone drop you off and come back when you are ready to be discharged.    Special note: Every effort is made to have your procedure done on time. Please understand that emergencies sometimes delay scheduled procedures.  2. Diet: Nothing to eat after midnight.   3. Hydration: On September 19, you may drink approved liquids (see below) until 2 hours before the procedure with 8 oz of water  as your last intake.   List of approved liquids water , clear juice, clear tea, black coffee, fruit juices, non-citric and without pulp, carbonated beverages, Gatorade, Kool -Aid, plain Jello-O and plain ice popsicles.  4. Labs: You will need to have blood drawn on TODAY.  5. Medication instructions in preparation for your procedure:  DO NOT TAKE THE MORNING OF YOUR PROCEDURE: Farxiga , Metformin    Contrast Allergy: No   On the morning of your procedure, take any morning medicines NOT listed above.  You may use sips of water .  6. Plan to go home the same day, you will only stay  overnight if medically necessary. 7. Bring a current list of your medications and current insurance cards. 8. You MUST have a responsible person to drive you home. 9. Someone MUST be with you the first 24 hours after you arrive home or your discharge will be delayed. 10. Please wear clothes that are easy to get on and off and wear slip-on shoes.  Thank you for allowing us  to care for you!   -- [facility]    Follow-Up in: Please follow up with the [facility] in 2 weeks with [name], FNP and again in 1 month with Dr. Shuck.    Thank you for choosing [facility].    At the [facility], you and your health needs are our priority. We have a designated team specialized in the treatment of Heart Failure. This Care Team includes your primary Heart Failure Specialized Cardiologist (physician), Advanced Practice Providers (APPs- Physician Assistants and Nurse Practitioners), and Pharmacist who all work together to provide you with the care you need, when you need it.   You may see any of the following providers on your designated Care Team at your next follow up:  Dr. Toribio Fuel Dr. Ezra Shuck Dr. Ria Commander Dr. Morene Brownie [name], FNP Jaun Bash, RPH-CPP  Please be sure to bring in all your medications bottles to every appointment.   Need to Contact Us :  If you have  any questions or concerns before your next appointment please send us  a message through [place] or call our office at [phone].    TO LEAVE A MESSAGE FOR THE NURSE SELECT OPTION 2, PLEASE LEAVE A MESSAGE INCLUDING: YOUR NAME DATE OF BIRTH CALL BACK NUMBER REASON FOR CALL**this is important as we prioritize the call backs  YOU WILL RECEIVE A CALL BACK THE SAME DAY AS LONG AS YOU CALL BEFORE 4:00 PM

## 2024-03-19 NOTE — Progress Notes (Addendum)
 PCP: [name], DO HF Cardiology: Dr. Rolan  Chief complaint: CHF  68 y.o. with history of bicuspid aortic valve disorder s/p mechanical AVR in 9/11 and 5.5 cm ascending aortic aneurysm, chronic systolic CHF, and smoking was initially referred by Dr. German for optimization of CHF prior to ascending aorta replacement surgery.  Echo in 7/20 showed EF 40-45% with stable mechanical aortic valve.  RHC/LHC in 7/20 showed nonobstructive CAD and R>L sided failure.    CHF has worsened over the last year.  Patient was admitted to [facility] in 12/24 with VT, was started on amiodarone .  Cardiac MRI in 12/24 showed LV EF 20%, RV EF 41%, mid-myocardial basal septal LGE (nonspecific non-coronary pattern).  RHC/LHC in 12/24 showed nonobstructive CAD, elevated filling pressures, CI 1.9.   He had a St Jude ICD placed. In 2/25, he was admitted with atrial fibrillation and had DCCV to NSR. Echo in 3/25 showed EF 25-30% with severe LV dilation, normal RV size with mild systolic dysfunction, mechanical aortic valve with mean gradient 13 mmHg.    CT chest in 3/25 showed 5.6 cm ascending aorta aneurysm.   He was admitted at [facility] in 8/25 with VT, device was upgraded to [facility] CRT-D.  He was admitted to [facility] in 9/25 with nausea/vomiting/dehydration in setting of GLP-1 agonist use.  He had AKI.  Torsemide  was stopped.   Patient returns today for followup of CHF.  This is the first time I have seen him since 2/21.  He remains off torsemide , stopped during his 9/25 hospitalization.  Creatinine down to 1.97 on last check.  BP noted to be running low, SBP in 80s today at [facility] Cardiology appt and at current CHF Clinic appt. He has been on midodrine  to keep BP up.  He gets lightheaded if he stands up too fast.  No falls or syncope though he says that his balance is poor.  Main complaint is actually dysphagia, he is only able to eat soft foods/drink liquids.  Weight has been trending down.  He is able to walk about 100 feet before  he has to stop to rest.  He is short of breath with stairs.  No chest pain.  He is not short of breath with showering/getting dressed/ADLs.  He does get short of breath pulling the garbage can. No orthopnea.  He still smoking 4-5 cigarettes/day.   Labs (9/25): K 3.1, creatinine 1.97, hgb 14, BNP 421  St Jude device interrogation: 90% BiV pacing, no recent VT, thoracic impedance not low  ECG (personally reviewed): V-paced, atrial rhythm difficult with artifactual baseline (may be atrial fibrillation.   REDS clip 34%  PMH: 1. Bicuspid aortic valve: s/p mechanical AVR 9/11 - Aortic valve stable on 3/25 echo.  2. H/o CVA 3. Hyperlipidemia 4. H/o renal infarct 5. Chronic systolic CHF: Nonischemic cardiomyopathy. St Jude CRT-D device.  - Echo (7/20): EF 40-45%, normal RV, mechanical aortic valve with mean gradient 15 mmHg - LHC/RHC (7/20): 60% stenosis D1; mean RA 22, RV 45/26 mean 36, mean PCWP 23, CI 2.4 - Cardiac MRI (12/24): LV EF 20%, RV EF 41%, mid-myocardial basal septal LGE (nonspecific non-coronary pattern). - LHC/RHC (12/24): 60% D1; mean RA 25, PA 50/28 mean 35, mean PCWP 30, CI 1.9.  - Echo (3/25): EF 25-30% with severe LV dilation, normal RV size with mild systolic dysfunction, mechanical aortic valve with mean gradient 13 mmHg.   6. Ascending aortic aneurysm: Suspect related to bicuspid aortic valve.  - CTA (7/20) with  5.5 cm ascending aorta.  - CT chest w/o contrast (3/25): 5.6 cm ascending aorta.  7. OSA: moderate.  8. CKD stage 3 9. Type 2 diabetes 10. VT: 12/24, 8/25.  11. Atrial fibrillation/flutter: DCCV in 2/25.  12. Gout  Social History   Socioeconomic History   Marital status: Divorced    Spouse name: Not on file   Number of children: 1   Years of education: 14   Highest education level: Associate degree: occupational, Scientist, product/process development, or vocational program  Occupational History   Occupation: Disabled    Employer: UNEMPLOYED  Tobacco Use   Smoking status: Every  Day    Current packs/day: 0.20    Average packs/day: 0.5 packs/day for 48.2 years (23.7 ttl pk-yrs)    Types: Cigarettes    Start date: 1974    Last attempt to quit: 2021   Smokeless tobacco: Never   Tobacco comments:    3 or less cigarettes per day  Vaping Use   Vaping status: Never Used  Substance and Sexual Activity   Alcohol use: Never   Drug use: Never   Sexual activity: Not Currently  Other Topics Concern   Not on file  Social History Narrative   Not on file   Social Drivers of Health   Financial Resource Strain: Low Risk  (02/05/2024)   Received from [facility]   Overall Financial Resource Strain (CARDIA)    How hard is it for you to pay for the very basics like food, housing, medical care, and heating?: Not very hard  Recent Concern: Financial Resource Strain - High Risk (12/19/2023)   Overall Financial Resource Strain (CARDIA)    Difficulty of Paying Living Expenses: Hard  Food Insecurity: No Food Insecurity (03/07/2024)   Hunger Vital Sign    Worried About Running Out of Food in the Last Year: Never true    Ran Out of Food in the Last Year: Never true  Recent Concern: Food Insecurity - Food Insecurity Present (12/19/2023)   Hunger Vital Sign    Worried About Running Out of Food in the Last Year: Sometimes true    Ran Out of Food in the Last Year: Sometimes true  Transportation Needs: No Transportation Needs (03/07/2024)   PRAPARE - Administrator, Civil Service (Medical): No    Lack of Transportation (Non-Medical): No  Physical Activity: Insufficiently Active (12/19/2023)   Exercise Vital Sign    Days of Exercise per Week: 1 day    Minutes of Exercise per Session: 10 min  Stress: Stress Concern Present (12/19/2023)   [employer] of Occupational Health - Occupational Stress Questionnaire    Feeling of Stress: Very much  Social Connections: Socially Isolated (03/07/2024)   Social Connection and Isolation Panel    Frequency of Communication with  Friends and Family: Never    Frequency of Social Gatherings with Friends and Family: Never    Attends Religious Services: Never    Database administrator or Organizations: No    Attends Banker Meetings: Never    Marital Status: Married  Catering manager Violence: Not At Risk (03/07/2024)   Humiliation, Afraid, Rape, and Kick questionnaire    Fear of Current or Ex-Partner: No    Emotionally Abused: No    Physically Abused: No    Sexually Abused: No   Family History  Problem Relation Age of Onset   Arthritis Mother    Dementia Mother    Colon cancer Mother    Arthritis  Father    Diabetes Father    Stroke Father    Colon cancer Father    Heart attack Brother    Breast cancer Sister    Seizures Sister    Cancer Brother        brain   Heart disease Brother    Heart attack Brother    ROS: All systems reviewed and negative except as per HPI.  Current Outpatient Medications  Medication Sig Dispense Refill   albuterol  (PROVENTIL ) (2.5 MG/3ML) 0.083% nebulizer solution USE 1 VIAL IN NEBULIZER EVERY 6 HOURS AS NEEDED FOR WHEEZING FOR SHORTNESS OF BREATH 180 mL 1   allopurinol  (ZYLOPRIM ) 100 MG tablet Take 1 tablet (100 mg total) by mouth daily. 30 tablet 0   amiodarone  (PACERONE ) 200 MG tablet Take 200 mg by mouth daily.     baclofen  (LIORESAL ) 10 MG tablet Take 0.5-1 tablets (5-10 mg total) by mouth at bedtime as needed. 30 each 0   buPROPion  (WELLBUTRIN  XL) 300 MG 24 hr tablet Take 300 mg by mouth daily.     dapagliflozin  propanediol (FARXIGA ) 10 MG TABS tablet Take 1 tablet (10 mg total) by mouth daily before breakfast. 90 tablet 3   fenofibrate  (TRICOR ) 48 MG tablet Take 48 mg by mouth daily.     Fluticasone -Umeclidin-Vilant (TRELEGY ELLIPTA ) 200-62.5-25 MCG/ACT AEPB Inhale 1 puff into the lungs daily in the afternoon. 1 each 0   mexiletine (MEXITIL ) 150 MG capsule Take 150 mg by mouth 3 (three) times daily.     midodrine  (PROAMATINE ) 2.5 MG tablet Take 1 tablet (2.5  mg total) by mouth 2 (two) times daily with a meal. 60 tablet 3   nortriptyline  (PAMELOR ) 10 MG capsule Take 1 capsule by mouth at bedtime 90 capsule 0   Omega-3 Fatty Acids (OMEGA 3 PO) Take 1,500 mg by mouth 2 (two) times daily.     rosuvastatin  (CRESTOR ) 40 MG tablet Take 1 tablet (40 mg total) by mouth daily. 90 tablet 3   Spacer/Aero-Holding [facility] Use as directed Dx: COPD, J44.9 1 each 1   traZODone  (DESYREL ) 50 MG tablet Take 0.5 tablets (25 mg total) by mouth at bedtime as needed for sleep. 30 tablet 3   VENTOLIN  HFA 108 (90 Base) MCG/ACT inhaler INHALE 2 PUFFS BY MOUTH EVERY 6 HOURS AS NEEDED FOR WHEEZING OR SHORTNESS OF BREATH 54 g 6   warfarin (COUMADIN ) 1 MG tablet Take 1 tablet (1 mg total) by mouth daily. (Patient taking differently: Take 2 mg by mouth daily.) 30 tablet 3   metFORMIN  (GLUCOPHAGE -XR) 500 MG 24 hr tablet Take 1 tablet by mouth once daily with breakfast 90 tablet 0   metoprolol  succinate (TOPROL -XL) 50 MG 24 hr tablet Take 1 tablet (50 mg total) by mouth daily. 30 tablet 4   Current Facility-Administered Medications  Medication Dose Route Frequency Provider Last Rate Last Admin   albuterol  (PROVENTIL ) (2.5 MG/3ML) 0.083% nebulizer solution 2.5 mg  2.5 mg Nebulization Once        BP (!) 81/69   Pulse 79   Wt 240 lb (108.9 kg)   BMI 38.74 kg/m   General: NAD Neck: No JVD, no thyromegaly or thyroid  nodule.  Lungs: Clear to auscultation bilaterally with normal respiratory effort. CV: Nondisplaced PMI.  Heart regular S1/S2 with mechanical S2, no S3/S4, 2/6 early SEM RUSB.  No peripheral edema.  No carotid bruit.  Difficult to palpate pedal pulses.  Abdomen: Soft, nontender, no hepatosplenomegaly, no distention.  Skin: Intact without lesions or  rashes.  Neurologic: Alert and oriented x 3.  Psych: Normal affect. Extremities: No clubbing or cyanosis.  HEENT: Normal.   Assessment/Plan: 1. Chronic systolic CHF:  Nonischemic cardiomyopathy.  St Jude CRT-D  device. Nonobstructive CAD on caths in 7/20 and 12/24. Cardiac MRI in 12/24 showed LV EF 20%, RV EF 41%, mid-myocardial basal septal LGE (nonspecific non-coronary pattern).  Echo in 3/25 showed EF 25-30% with severe LV dilation, normal RV size with mild systolic dysfunction, mechanical aortic valve with mean gradient 13 mmHg.  BP has been running low with some orthostatic-type symptoms.  He has required midodrine  to keep up his BP.  He is, however, also taking Toprol  XL 200 mg daily. He does not look volume overloaded to me by exam and [place]vue does not suggest volume overload.  Recent AKI. Concern for low output state, especially given CI 1.9 on RHC in 12/24.  His symptoms are probably class III; he seems more bothered by his dysphagia.  - Decrease Toprol  XL from 200 mg daily to 50 mg daily with low BP and suspected low output.  May need to gradually lower farther but will reassess.  - He will continue midodrine , but would like to stop this if we can stabilize his BP.  - No torsemide  for now.  I think it would be reasonable to restart him on Farxiga  10 mg daily tomorrow.  - I will arrange for RHC this week to assess filling pressures and cardiac output.  In the past, he has had prominent RV failure.  The thoracic aortic aneurysm will be a complicating issue when considering LVAD candidacy. Continue warfarin for RHC but will check INR today and adjust to keep INR closer to 2.5.  We discussed risks/benefits and he agrees to procedure.  - K was low on recent BMET, repeat BMET/BNP today.  2. OSA: Moderate OSA, waiting for CPAP.  3. CAD: Nonobstructive on 7/20 and 12/24 caths.  I would aim for LDL at least < 70.  - Continue Crestor  40 mg daily.    4. Mechanical aortic valve: Stable function on 3/25 echo.  Given history of CVA, should have Lovenox  bridging when he needs to hold warfarin.  Goal INR 2.5-3.5 with additional history of atrial fibrillation.   5. Smoking: We discussed cessation.   6. Ascending aortic  aneurysm: Suspect this is related to his bicuspid aortic valve.  Last CT chest w/o contrast in 3/25 showed aneurysm 5.6 cm.  He would be high risk for repair.  This will also complicate consideration of LVAD.  7. Obesity: he tolerate GLP-1 agonist poorly with AKI/dehydration.  8. VT: He has St Jude CRT-D.  - Decrease amiodarone  to 200 mg daily.  Follow LFTs, TSH.  He will need regular eye exam.  - Continue mexiletine 150 mg tid.  9. Atrial fibrillation/flutter: DCCV in 2/25.  Rhythm is difficult today given artifactual baseline.  He may be in atrial fibrillation with regular BiV pacing.  - Will need to review device interrogation with EP to determine.  Consider repeat DCCV if in AF.  10. Dysphagia: This is a major complaint.  He can only eat soft foods and drink liquids. - has followup with GI.   Followup with NP Hackney in 2 wks, see me in 1 month.   I spent 42 minutes reviewing records, interviewing/examining patient, and managing orders.   Ezra Shuck 03/19/2024

## 2024-03-19 NOTE — Progress Notes (Signed)
 Cardiology Office Note:  .   Date:  03/19/2024  ID:  Roberto [name], DOB 02/19/1956, MRN [id number] PCP: [name], DO  [facility] Providers Cardiologist:  [name], MD Electrophysiologist:  Danelle Birmingham, MD  Sleep Medicine:  Wilbert Bihari, MD     History of Present Illness: .   Roberto [name] is a 68 y.o. male with history of mechanical aortic valve replacement, non-obstructive coronary artery disease, thoracic aortic aneurysm (followed by [facility]), stroke, right renal infarct, chronic systolic heart failure (LVEF 20% by cardiac MRI in 06/2023) complicated by VT s/p ICD and subsequent upgrade to CRT-D, persistent atrial fibrillation, hyperlipidemia, obstructive sleep apnea not using CPAP, and morbid obesity, who returns for follow-up of HFrEF and aortic/aortic valve disease.  He was hospitalized at [facility] in early August with wide-complex tachycardia.  He initially presented to [facility] because he was feeling very weak and lightheaded.  He was reportedly found to be in a wide-complex tachycardia and was airlifted to [facility].  Ultimately, his device was upgraded from a single-chamber ICD to biventricular pacemaker.  He was seen in the heart failure clinic on 02/27/2024, at which time blood pressure was noted to be low at 79/53.  As he appeared euvolemic, his torsemide  was reduced from 20 mg twice daily to 20 mg daily.  Due to persistent hypotension at his most recent visit with Dr. Vicci in early September, he was started on midodrine .  He was hospitalized at [facility] on 03/07/2024 due to dehydration in the setting of nausea/vomiting and poor oral intake felt to be due to GLP-1 use.  Today, Roberto [name] reports that he continues to feel quite tired and weak.  He also feels off balance but has not had any recent falls.  He denies chest pain.  He gets short of breath with mild activity and even lying down, though he does not describe frank orthopnea.  He recently completed a  home sleep study and is waiting to hear from Dr. Tamea about the results.  He has not had any significant edema, reporting that he was advised to discontinue his torsemide  earlier this week at his nephrology appointment due to continued elevated creatinine.  Roberto [name] is also concerned about persistent hoarseness as well as nausea and heaving.  He often times feels like it is hard for food to go down, and if it does go to his stomach, that he needs to heave/vomit.  He has been eating soft foods primarily such as scrambled eggs and ice cream.  His weight has continued to trend down, though this has not been from exercise as his functional capacity has been very limited.  His significant other notes that his speech has seemed slurred for the last 6 to 7 weeks.  He has not had any other focal neurologic deficits.  He reports being compliant with his medications.  He has not had any bleeding.  ROS: See HPI  Studies Reviewed: SABRA   EKG Interpretation Date/Time:  Wednesday March 19 2024 09:59:54 EDT Ventricular Rate:  80 PR Interval:    QRS Duration:  180 QT Interval:  500 QTC Calculation: 576 R Axis:   199  Text Interpretation: Ventricular-paced rhythm When compared with ECG of 07-Mar-2024 12:54, No significant change was found Confirmed by Thornton Dohrmann [phone]) on 03/19/2024 10:15:17 AM    Risk Assessment/Calculations:             Physical Exam:   VS:  BP 92/72   Pulse 80  Ht 5' 6 (1.676 m)   Wt 239 lb 9.6 oz (108.7 kg)   SpO2 98%   BMI 38.67 kg/m    Wt Readings from Last 3 Encounters:  03/19/24 239 lb 9.6 oz (108.7 kg)  03/17/24 242 lb 3.2 oz (109.9 kg)  03/09/24 246 lb 7.6 oz (111.8 kg)    General: Chronically ill-appearing man, seated on exam table.. Neck: No JVD or HJR. Lungs: Clear to auscultation bilaterally without wheezes or crackles. Heart: Regular rate and rhythm with mechanical S2. Abdomen: Soft, nontender, nondistended. Extremities: No lower extremity edema.   Lower extremities are warm.  ASSESSMENT AND PLAN: .    Chronic HFrEF: Roberto [name] continues to decline with poor functional capacity.  Dyspnea with mild activities and when lying down (though he denies frank orthopnea).  I am also concerned that he had recurrent VT leading to upgrade of his ICD to a biventricular pacemaker last month at [facility].  Unfortunately, he does not feel any better; if anything he feels worse.  His recent hospitalization for AKI and continued worsening renal insufficiency prompting complete discontinuation of torsemide  are concerning for low output heart failure.  His right heart catheterization in 06/2023 in the setting of his initial hospitalization for VT showed reduced cardiac index of 1.9 L/min/m.  As he does not appear significantly volume overloaded, I agree with holding torsemide .  If possible, he should resume dapagliflozin .  We are unable to add any additional GDMT with his soft blood pressure that recently necessitated addition of midodrine  (not ideal given associated increase in afterload).  Roberto [name] will see Dr. Rolan in the advanced heart failure clinic this afternoon for further evaluation.  I think repeat right heart catheterization to help guide his management would be helpful.  Ventricular tachycardia: Roberto [name] has been hospitalized twice with sustained VT, most recently last month at [facility] which led to upgrade of his ICD to CRT-D.  Unfortunately, he feels no better though he has not had any significant palpitations or lightheadedness like when he presented to the hospital with VT.  I will reach out to Dr. Waddell and our device team to ensure that ongoing follow-up of his ICD is completed through our practice, as he does not wish to follow with EP at [facility].  Continue amiodarone  and mexiletine under the direction of Dr. Waddell.  Persistent atrial fibrillation: Difficult to determine underlying rhythm in the setting of ventricular pacing.  Continue warfarin and  amiodarone .  Nonobstructive coronary artery disease: No angina reported.  Continue medical therapy to prevent progression.  Aortic stenosis status post mechanical AVR: Continue warfarin.  Thoracic aortic aneurysm: Patient not an operative candidate in the past due to his heart failure and morbid obesity.  Continue surveillance with Dr. Lucas (ascending aorta most recently measured at 5.6 x 5.4 cm in 09/2023).    Dispo: Return to see me in ~3 months.  Signed, [name], MD

## 2024-03-19 NOTE — H&P (View-Only) (Signed)
 PCP: [name], DO HF Cardiology: Dr. Rolan  Chief complaint: CHF  68 y.o. with history of bicuspid aortic valve disorder s/p mechanical AVR in 9/11 and 5.5 cm ascending aortic aneurysm, chronic systolic CHF, and smoking was initially referred by Dr. German for optimization of CHF prior to ascending aorta replacement surgery.  Echo in 7/20 showed EF 40-45% with stable mechanical aortic valve.  RHC/LHC in 7/20 showed nonobstructive CAD and R>L sided failure.    CHF has worsened over the last year.  Patient was admitted to [facility] in 12/24 with VT, was started on amiodarone .  Cardiac MRI in 12/24 showed LV EF 20%, RV EF 41%, mid-myocardial basal septal LGE (nonspecific non-coronary pattern).  RHC/LHC in 12/24 showed nonobstructive CAD, elevated filling pressures, CI 1.9.   He had a St Jude ICD placed. In 2/25, he was admitted with atrial fibrillation and had DCCV to NSR. Echo in 3/25 showed EF 25-30% with severe LV dilation, normal RV size with mild systolic dysfunction, mechanical aortic valve with mean gradient 13 mmHg.    CT chest in 3/25 showed 5.6 cm ascending aorta aneurysm.   He was admitted at [facility] in 8/25 with VT, device was upgraded to [facility] CRT-D.  He was admitted to [facility] in 9/25 with nausea/vomiting/dehydration in setting of GLP-1 agonist use.  He had AKI.  Torsemide  was stopped.   Patient returns today for followup of CHF.  This is the first time I have seen him since 2/21.  He remains off torsemide , stopped during his 9/25 hospitalization.  Creatinine down to 1.97 on last check.  BP noted to be running low, SBP in 80s today at [facility]iology appt and at current CHF Clinic appt. He has been on midodrine  to keep BP up.  He gets lightheaded if he stands up too fast.  No falls or syncope though he says that his balance is poor.  Main complaint is actually dysphagia, he is only able to eat soft foods/drink liquids.  Weight has been trending down.  He is able to walk about 100 feet before  he has to stop to rest.  He is short of breath with stairs.  No chest pain.  He is not short of breath with showering/getting dressed/ADLs.  He does get short of breath pulling the garbage can. No orthopnea.  He still smoking 4-5 cigarettes/day.   Labs (9/25): K 3.1, creatinine 1.97, hgb 14, BNP 421  St Jude device interrogation: 90% BiV pacing, no recent VT, thoracic impedance not low  ECG (personally reviewed): V-paced, atrial rhythm difficult with artifactual baseline (may be atrial fibrillation.   REDS clip 34%  PMH: 1. Bicuspid aortic valve: s/p mechanical AVR 9/11 - Aortic valve stable on 3/25 echo.  2. H/o CVA 3. Hyperlipidemia 4. H/o renal infarct 5. Chronic systolic CHF: Nonischemic cardiomyopathy. St Jude CRT-D device.  - Echo (7/20): EF 40-45%, normal RV, mechanical aortic valve with mean gradient 15 mmHg - LHC/RHC (7/20): 60% stenosis D1; mean RA 22, RV 45/26 mean 36, mean PCWP 23, CI 2.4 - Cardiac MRI (12/24): LV EF 20%, RV EF 41%, mid-myocardial basal septal LGE (nonspecific non-coronary pattern). - LHC/RHC (12/24): 60% D1; mean RA 25, PA 50/28 mean 35, mean PCWP 30, CI 1.9.  - Echo (3/25): EF 25-30% with severe LV dilation, normal RV size with mild systolic dysfunction, mechanical aortic valve with mean gradient 13 mmHg.   6. Ascending aortic aneurysm: Suspect related to bicuspid aortic valve.  - CTA (7/20) with  5.5 cm ascending aorta.  - CT chest w/o contrast (3/25): 5.6 cm ascending aorta.  7. OSA: moderate.  8. CKD stage 3 9. Type 2 diabetes 10. VT: 12/24, 8/25.  11. Atrial fibrillation/flutter: DCCV in 2/25.  12. Gout  Social History   Socioeconomic History   Marital status: Divorced    Spouse name: Not on file   Number of children: 1   Years of education: 14   Highest education level: Associate degree: occupational, Scientist, product/process development, or vocational program  Occupational History   Occupation: Disabled    Employer: UNEMPLOYED  Tobacco Use   Smoking status: Every  Day    Current packs/day: 0.20    Average packs/day: 0.5 packs/day for 48.2 years (23.7 ttl pk-yrs)    Types: Cigarettes    Start date: 1974    Last attempt to quit: 2021   Smokeless tobacco: Never   Tobacco comments:    3 or less cigarettes per day  Vaping Use   Vaping status: Never Used  Substance and Sexual Activity   Alcohol use: Never   Drug use: Never   Sexual activity: Not Currently  Other Topics Concern   Not on file  Social History Narrative   Not on file   Social Drivers of Health   Financial Resource Strain: Low Risk  (02/05/2024)   Received from [facility]   Overall Financial Resource Strain (CARDIA)    How hard is it for you to pay for the very basics like food, housing, medical care, and heating?: Not very hard  Recent Concern: Financial Resource Strain - High Risk (12/19/2023)   Overall Financial Resource Strain (CARDIA)    Difficulty of Paying Living Expenses: Hard  Food Insecurity: No Food Insecurity (03/07/2024)   Hunger Vital Sign    Worried About Running Out of Food in the Last Year: Never true    Ran Out of Food in the Last Year: Never true  Recent Concern: Food Insecurity - Food Insecurity Present (12/19/2023)   Hunger Vital Sign    Worried About Running Out of Food in the Last Year: Sometimes true    Ran Out of Food in the Last Year: Sometimes true  Transportation Needs: No Transportation Needs (03/07/2024)   PRAPARE - Administrator, Civil Service (Medical): No    Lack of Transportation (Non-Medical): No  Physical Activity: Insufficiently Active (12/19/2023)   Exercise Vital Sign    Days of Exercise per Week: 1 day    Minutes of Exercise per Session: 10 min  Stress: Stress Concern Present (12/19/2023)   [employer] of Occupational Health - Occupational Stress Questionnaire    Feeling of Stress: Very much  Social Connections: Socially Isolated (03/07/2024)   Social Connection and Isolation Panel    Frequency of Communication with  Friends and Family: Never    Frequency of Social Gatherings with Friends and Family: Never    Attends Religious Services: Never    Database administrator or Organizations: No    Attends Banker Meetings: Never    Marital Status: Married  Catering manager Violence: Not At Risk (03/07/2024)   Humiliation, Afraid, Rape, and Kick questionnaire    Fear of Current or Ex-Partner: No    Emotionally Abused: No    Physically Abused: No    Sexually Abused: No   Family History  Problem Relation Age of Onset   Arthritis Mother    Dementia Mother    Colon cancer Mother    Arthritis  Father    Diabetes Father    Stroke Father    Colon cancer Father    Heart attack Brother    Breast cancer Sister    Seizures Sister    Cancer Brother        brain   Heart disease Brother    Heart attack Brother    ROS: All systems reviewed and negative except as per HPI.  Current Outpatient Medications  Medication Sig Dispense Refill   albuterol  (PROVENTIL ) (2.5 MG/3ML) 0.083% nebulizer solution USE 1 VIAL IN NEBULIZER EVERY 6 HOURS AS NEEDED FOR WHEEZING FOR SHORTNESS OF BREATH 180 mL 1   allopurinol  (ZYLOPRIM ) 100 MG tablet Take 1 tablet (100 mg total) by mouth daily. 30 tablet 0   amiodarone  (PACERONE ) 200 MG tablet Take 200 mg by mouth daily.     baclofen  (LIORESAL ) 10 MG tablet Take 0.5-1 tablets (5-10 mg total) by mouth at bedtime as needed. 30 each 0   buPROPion  (WELLBUTRIN  XL) 300 MG 24 hr tablet Take 300 mg by mouth daily.     dapagliflozin  propanediol (FARXIGA ) 10 MG TABS tablet Take 1 tablet (10 mg total) by mouth daily before breakfast. 90 tablet 3   fenofibrate  (TRICOR ) 48 MG tablet Take 48 mg by mouth daily.     Fluticasone -Umeclidin-Vilant (TRELEGY ELLIPTA ) 200-62.5-25 MCG/ACT AEPB Inhale 1 puff into the lungs daily in the afternoon. 1 each 0   mexiletine (MEXITIL ) 150 MG capsule Take 150 mg by mouth 3 (three) times daily.     midodrine  (PROAMATINE ) 2.5 MG tablet Take 1 tablet (2.5  mg total) by mouth 2 (two) times daily with a meal. 60 tablet 3   nortriptyline  (PAMELOR ) 10 MG capsule Take 1 capsule by mouth at bedtime 90 capsule 0   Omega-3 Fatty Acids (OMEGA 3 PO) Take 1,500 mg by mouth 2 (two) times daily.     rosuvastatin  (CRESTOR ) 40 MG tablet Take 1 tablet (40 mg total) by mouth daily. 90 tablet 3   Spacer/Aero-Holding [facility] Use as directed Dx: COPD, J44.9 1 each 1   traZODone  (DESYREL ) 50 MG tablet Take 0.5 tablets (25 mg total) by mouth at bedtime as needed for sleep. 30 tablet 3   VENTOLIN  HFA 108 (90 Base) MCG/ACT inhaler INHALE 2 PUFFS BY MOUTH EVERY 6 HOURS AS NEEDED FOR WHEEZING OR SHORTNESS OF BREATH 54 g 6   warfarin (COUMADIN ) 1 MG tablet Take 1 tablet (1 mg total) by mouth daily. (Patient taking differently: Take 2 mg by mouth daily.) 30 tablet 3   metFORMIN  (GLUCOPHAGE -XR) 500 MG 24 hr tablet Take 1 tablet by mouth once daily with breakfast 90 tablet 0   metoprolol  succinate (TOPROL -XL) 50 MG 24 hr tablet Take 1 tablet (50 mg total) by mouth daily. 30 tablet 4   Current Facility-Administered Medications  Medication Dose Route Frequency Provider Last Rate Last Admin   albuterol  (PROVENTIL ) (2.5 MG/3ML) 0.083% nebulizer solution 2.5 mg  2.5 mg Nebulization Once        BP (!) 81/69   Pulse 79   Wt 240 lb (108.9 kg)   BMI 38.74 kg/m   General: NAD Neck: No JVD, no thyromegaly or thyroid  nodule.  Lungs: Clear to auscultation bilaterally with normal respiratory effort. CV: Nondisplaced PMI.  Heart regular S1/S2 with mechanical S2, no S3/S4, 2/6 early SEM RUSB.  No peripheral edema.  No carotid bruit.  Difficult to palpate pedal pulses.  Abdomen: Soft, nontender, no hepatosplenomegaly, no distention.  Skin: Intact without lesions or  rashes.  Neurologic: Alert and oriented x 3.  Psych: Normal affect. Extremities: No clubbing or cyanosis.  HEENT: Normal.   Assessment/Plan: 1. Chronic systolic CHF:  Nonischemic cardiomyopathy.  St Jude CRT-D  device. Nonobstructive CAD on caths in 7/20 and 12/24. Cardiac MRI in 12/24 showed LV EF 20%, RV EF 41%, mid-myocardial basal septal LGE (nonspecific non-coronary pattern).  Echo in 3/25 showed EF 25-30% with severe LV dilation, normal RV size with mild systolic dysfunction, mechanical aortic valve with mean gradient 13 mmHg.  BP has been running low with some orthostatic-type symptoms.  He has required midodrine  to keep up his BP.  He is, however, also taking Toprol  XL 200 mg daily. He does not look volume overloaded to me by exam and [place] does not suggest volume overload.  Recent AKI. Concern for low output state, especially given CI 1.9 on RHC in 12/24.  His symptoms are probably class III; he seems more bothered by his dysphagia.  - Decrease Toprol  XL from 200 mg daily to 50 mg daily with low BP and suspected low output.  May need to gradually lower farther but will reassess.  - He will continue midodrine , but would like to stop this if we can stabilize his BP.  - No torsemide  for now.  I think it would be reasonable to restart him on Farxiga  10 mg daily tomorrow.  - I will arrange for RHC this week to assess filling pressures and cardiac output.  In the past, he has had prominent RV failure.  The thoracic aortic aneurysm will be a complicating issue when considering LVAD candidacy. Continue warfarin for RHC but will check INR today and adjust to keep INR closer to 2.5.  We discussed risks/benefits and he agrees to procedure.  - K was low on recent BMET, repeat BMET/BNP today.  2. OSA: Moderate OSA, waiting for CPAP.  3. CAD: Nonobstructive on 7/20 and 12/24 caths.  I would aim for LDL at least < 70.  - Continue Crestor  40 mg daily.    4. Mechanical aortic valve: Stable function on 3/25 echo.  Given history of CVA, should have Lovenox  bridging when he needs to hold warfarin.  Goal INR 2.5-3.5 with additional history of atrial fibrillation.   5. Smoking: We discussed cessation.   6. Ascending aortic  aneurysm: Suspect this is related to his bicuspid aortic valve.  Last CT chest w/o contrast in 3/25 showed aneurysm 5.6 cm.  He would be high risk for repair.  This will also complicate consideration of LVAD.  7. Obesity: he tolerate GLP-1 agonist poorly with AKI/dehydration.  8. VT: He has St Jude CRT-D.  - Decrease amiodarone  to 200 mg daily.  Follow LFTs, TSH.  He will need regular eye exam.  - Continue mexiletine 150 mg tid.  9. Atrial fibrillation/flutter: DCCV in 2/25.  Rhythm is difficult today given artifactual baseline.  He may be in atrial fibrillation with regular BiV pacing.  - Will need to review device interrogation with EP to determine.  Consider repeat DCCV if in AF.  10. Dysphagia: This is a major complaint.  He can only eat soft foods and drink liquids. - has followup with GI.   Followup with NP Hackney in 2 wks, see me in 1 month.   I spent 42 minutes reviewing records, interviewing/examining patient, and managing orders.   Ezra Shuck 03/19/2024

## 2024-03-19 NOTE — Telephone Encounter (Signed)
 Allopurinol  discontinued 03/09/24.  Requested Prescriptions  Pending Prescriptions Disp Refills   allopurinol  (ZYLOPRIM ) 300 MG tablet [Pharmacy Med Name: Allopurinol  300 MG Oral Tablet] 90 tablet 0    Sig: Take 1 tablet by mouth once daily     Endocrinology:  Gout Agents - allopurinol  Failed - 03/19/2024  2:28 PM      Failed - Cr in normal range and within 360 days    Creatinine  Date Value Ref Range Status  05/25/2014 0.85 0.60 - 1.30 mg/dL Final   Creatinine, Ser  Date Value Ref Range Status  03/17/2024 1.97 (H) 0.76 - 1.27 mg/dL Final         Failed - CBC within normal limits and completed in the last 12 months    WBC  Date Value Ref Range Status  03/17/2024 9.7 3.4 - 10.8 x10E3/uL Final  03/09/2024 9.4 4.0 - 10.5 K/uL Final   RBC  Date Value Ref Range Status  03/17/2024 4.91 4.14 - 5.80 x10E6/uL Final  03/09/2024 4.72 4.22 - 5.81 MIL/uL Final   Hemoglobin  Date Value Ref Range Status  03/17/2024 14.0 13.0 - 17.7 g/dL Final   Hematocrit  Date Value Ref Range Status  03/17/2024 45.3 37.5 - 51.0 % Final   MCHC  Date Value Ref Range Status  03/17/2024 30.9 (L) 31.5 - 35.7 g/dL Final  90/92/7974 68.4 30.0 - 36.0 g/dL Final   [facility]  Date Value Ref Range Status  03/17/2024 28.5 26.6 - 33.0 pg Final  03/09/2024 28.6 26.0 - 34.0 pg Final   MCV  Date Value Ref Range Status  03/17/2024 92 79 - 97 fL Final  05/25/2014 88 80 - 100 fL Final   No results found for: PLTCOUNTKUC, LABPLAT, POCPLA RDW  Date Value Ref Range Status  03/17/2024 14.7 11.6 - 15.4 % Final  05/25/2014 13.9 11.5 - 14.5 % Final         Passed - Uric Acid in normal range and within 360 days    Uric Acid  Date Value Ref Range Status  03/17/2024 5.6 3.8 - 8.4 mg/dL Final    Comment:               Therapeutic target for gout patients: <6.0         Passed - Valid encounter within last 12 months    Recent Outpatient Visits           2 days ago Supratherapeutic INR   [place] [facility] [place], [name], DO   2 weeks ago H/O mechanical aortic valve replacement   [place] [facility] [place], [name], DO   2 weeks ago H/O mechanical aortic valve replacement   [place] [facility] [place], [name], DO   3 weeks ago H/O mechanical aortic valve replacement   [place] [name] [place], [name], DO   3 weeks ago H/O mechanical aortic valve replacement   [place] [facility] [place], Duwaine SQUIBB, DO       Future Appointments             In 2 months End, [name], MD  HeartCare at [facility]             metFORMIN  (GLUCOPHAGE -XR) 500 MG 24 hr tablet [Pharmacy Med Name: metFORMIN  HCl ER 500 MG Oral Tablet Extended Release 24 Hour] 90 tablet 0    Sig: Take 1 tablet by mouth once daily with breakfast  Endocrinology:  Diabetes - Biguanides Failed - 03/19/2024  2:28 PM      Failed - Cr in normal range and within 360 days    Creatinine  Date Value Ref Range Status  05/25/2014 0.85 0.60 - 1.30 mg/dL Final   Creatinine, Ser  Date Value Ref Range Status  03/17/2024 1.97 (H) 0.76 - 1.27 mg/dL Final         Failed - eGFR in normal range and within 360 days    EGFR (African American)  Date Value Ref Range Status  05/25/2014 >60 >67mL/min Final   GFR calc Af Amer  Date Value Ref Range Status  05/12/2020 64 >59 mL/min/1.73 Final    Comment:    **In accordance with recommendations from the NKF-ASN Task force,**   [facility] is in the process of updating its eGFR calculation to the   2021 CKD-EPI creatinine equation that estimates kidney function   without a race variable.    EGFR (Non-African Amer.)  Date Value Ref Range Status  05/25/2014 >60 >66mL/min Final    Comment:    eGFR values <83mL/min/1.73 m2 may be an indication of chronic kidney disease (CKD). Calculated eGFR, using the MRDR Study equation, is useful in  patients with stable renal function. The eGFR  calculation will not be reliable in acutely ill patients when serum creatinine is changing rapidly. It is not useful in patients on dialysis. The eGFR calculation may not be applicable to patients at the low and high extremes of body sizes, pregnant women, and vegetarians.    GFR, Estimated  Date Value Ref Range Status  03/09/2024 55 (L) >60 mL/min Final    Comment:    (NOTE) Calculated using the CKD-EPI Creatinine Equation (2021)    eGFR  Date Value Ref Range Status  03/17/2024 36 (L) >59 mL/min/1.73 Final         Failed - B12 Level in normal range and within 720 days    No results found for: VITAMINB12       Failed - CBC within normal limits and completed in the last 12 months    WBC  Date Value Ref Range Status  03/17/2024 9.7 3.4 - 10.8 x10E3/uL Final  03/09/2024 9.4 4.0 - 10.5 K/uL Final   RBC  Date Value Ref Range Status  03/17/2024 4.91 4.14 - 5.80 x10E6/uL Final  03/09/2024 4.72 4.22 - 5.81 MIL/uL Final   Hemoglobin  Date Value Ref Range Status  03/17/2024 14.0 13.0 - 17.7 g/dL Final   Hematocrit  Date Value Ref Range Status  03/17/2024 45.3 37.5 - 51.0 % Final   MCHC  Date Value Ref Range Status  03/17/2024 30.9 (L) 31.5 - 35.7 g/dL Final  90/92/7974 68.4 30.0 - 36.0 g/dL Final   [facility]  Date Value Ref Range Status  03/17/2024 28.5 26.6 - 33.0 pg Final  03/09/2024 28.6 26.0 - 34.0 pg Final   MCV  Date Value Ref Range Status  03/17/2024 92 79 - 97 fL Final  05/25/2014 88 80 - 100 fL Final   No results found for: PLTCOUNTKUC, LABPLAT, POCPLA RDW  Date Value Ref Range Status  03/17/2024 14.7 11.6 - 15.4 % Final  05/25/2014 13.9 11.5 - 14.5 % Final         Passed - HBA1C is between 0 and 7.9 and within 180 days    Hemoglobin A1C  Date Value Ref Range Status  04/19/2016 6.6  Final   HB A1C (BAYER DCA - WAIVED)  Date Value Ref  Range Status  03/04/2024 5.8 (H) 4.8 - 5.6 % Final    Comment:             Prediabetes: 5.7 - 6.4           Diabetes: >6.4          Glycemic control for adults with diabetes: <7.0          Passed - Valid encounter within last 6 months    Recent Outpatient Visits           2 days ago Supratherapeutic INR   [place] [facility] [place], [name], DO   2 weeks ago H/O mechanical aortic valve replacement   [place] [facility] [place], [name], DO   2 weeks ago H/O mechanical aortic valve replacement   [facility] [place], [name], DO   3 weeks ago H/O mechanical aortic valve replacement   [place] [facility] [place], [name], DO   3 weeks ago H/O mechanical aortic valve replacement    [place], Duwaine SQUIBB, DO       Future Appointments             In 2 months End, [name], MD [facility] at [facility]

## 2024-03-19 NOTE — Patient Instructions (Signed)
 Medication Instructions:  Your physician recommends the following medication changes.  RESTART: Farxiga  10 mg by mouth daily  Continue to hold Torsemide    *If you need a refill on your cardiac medications before your next appointment, please call your pharmacy*  Lab Work: No labs ordered today    Testing/Procedures: No test ordered today   Follow-Up: At [facility], you and your health needs are our priority.  As part of our continuing mission to provide you with exceptional heart care, our providers are all part of one team.  This team includes your primary Cardiologist (physician) and Advanced Practice Providers or APPs (Physician Assistants and Nurse Practitioners) who all work together to provide you with the care you need, when you need it.  Your next appointment:   3 month(s)  Provider:   You may see [name], MD or one of the following Advanced Practice Providers on your designated Care Team:   [name] Meager, NP Lesley Maffucci, [name] Bernardino Bring, [name] Cadence Franchester, [name] Tylene Lunch, NP Barnie Hila, NP    Your physician recommends that you schedule a follow-up appointment this week with the heart failure clinic.

## 2024-03-20 ENCOUNTER — Ambulatory Visit (INDEPENDENT_AMBULATORY_CARE_PROVIDER_SITE_OTHER): Admitting: Family Medicine

## 2024-03-20 ENCOUNTER — Encounter: Payer: Self-pay | Admitting: Family Medicine

## 2024-03-20 ENCOUNTER — Other Ambulatory Visit: Payer: Self-pay

## 2024-03-20 VITALS — BP 80/56 | HR 76 | Temp 97.4°F | Ht 66.0 in | Wt 239.4 lb

## 2024-03-20 DIAGNOSIS — R791 Abnormal coagulation profile: Secondary | ICD-10-CM | POA: Diagnosis not present

## 2024-03-20 DIAGNOSIS — I5022 Chronic systolic (congestive) heart failure: Secondary | ICD-10-CM

## 2024-03-20 LAB — COAGUCHEK XS/INR WAIVED
INR: 4.7 — ABNORMAL HIGH (ref 0.9–1.1)
Prothrombin Time: 56.4 s

## 2024-03-20 MED ORDER — BACLOFEN 10 MG PO TABS: 5.0000 mg | ORAL_TABLET | Freq: Every evening | ORAL | 0 refills | Status: DC | PRN

## 2024-03-20 NOTE — Assessment & Plan Note (Signed)
 See discussion under supratherapeutic INR.

## 2024-03-20 NOTE — Progress Notes (Signed)
 Orders placed for right heart cath. No precert required for procedure

## 2024-03-20 NOTE — Progress Notes (Signed)
 BP (!) 80/56 (BP Location: Left Arm, Patient Position: Sitting, Cuff Size: Normal)   Pulse 76   Temp (!) 97.4 F (36.3 C) (Oral)   Ht 5' 6 (1.676 m)   Wt 239 lb 6.4 oz (108.6 kg)   SpO2 (!) 81%   BMI 38.64 kg/m    Subjective:    Patient ID: Roberto [name], male    DOB: 07/04/1955, 68 y.o.   MRN: [id number]  HPI: Roberto [name] is a 68 y.o. male  Chief Complaint  Patient presents with   INR  Check   Coumadin  Management.  The expected duration of coumadin  treatment is lifelong The reason for anticoagulation is  mechanical heart valve.  Present Coumadin  dose: 1mg /91m alternating for the past 2 days Goal: 2.5-3.5  Excessive bruising: no Nose bleeding: no Rectal bleeding: no Prolonged menstrual cycles: N/A Eating diet with consistent amounts of foods containing Vitamin K:no Any recent antibiotic use? no  Relevant past medical, surgical, family and social history reviewed and updated as indicated. Interim medical history since our last visit reviewed. Allergies and medications reviewed and updated.  Review of Systems  Constitutional:  Positive for fatigue. Negative for activity change, appetite change, chills, diaphoresis, fever and unexpected weight change.  Respiratory: Negative.    Cardiovascular: Negative.   Gastrointestinal: Negative.   Musculoskeletal: Negative.   Neurological:  Positive for weakness. Negative for dizziness, tremors, seizures, syncope, facial asymmetry, speech difficulty, light-headedness, numbness and headaches.  Psychiatric/Behavioral: Negative.      Per HPI unless specifically indicated above     Objective:    BP (!) 80/56 (BP Location: Left Arm, Patient Position: Sitting, Cuff Size: Normal)   Pulse 76   Temp (!) 97.4 F (36.3 C) (Oral)   Ht 5' 6 (1.676 m)   Wt 239 lb 6.4 oz (108.6 kg)   SpO2 (!) 81%   BMI 38.64 kg/m   Wt Readings from Last 3 Encounters:  03/20/24 239 lb 6.4 oz (108.6 kg)  03/19/24 240 lb (108.9 kg)  03/19/24 239  lb 9.6 oz (108.7 kg)    Physical Exam Vitals and nursing note reviewed.  Constitutional:      General: He is not in acute distress.    Appearance: Normal appearance. He is obese. He is not ill-appearing, toxic-appearing or diaphoretic.  HENT:     Head: Normocephalic and atraumatic.     Right Ear: External ear normal.     Left Ear: External ear normal.     Nose: Nose normal.     Mouth/Throat:     Mouth: Mucous membranes are moist.     Pharynx: Oropharynx is clear.  Eyes:     General: No scleral icterus.       Right eye: No discharge.        Left eye: No discharge.     Extraocular Movements: Extraocular movements intact.     Conjunctiva/sclera: Conjunctivae normal.     Pupils: Pupils are equal, round, and reactive to light.  Cardiovascular:     Rate and Rhythm: Normal rate and regular rhythm.     Pulses: Normal pulses.     Heart sounds: Normal heart sounds. No murmur heard.    No friction rub. No gallop.  Pulmonary:     Effort: Pulmonary effort is normal. No respiratory distress.     Breath sounds: Normal breath sounds. No stridor. No wheezing, rhonchi or rales.  Chest:     Chest wall: No tenderness.  Musculoskeletal:  General: Normal range of motion.     Cervical back: Normal range of motion and neck supple.  Skin:    General: Skin is warm and dry.     Capillary Refill: Capillary refill takes less than 2 seconds.     Coloration: Skin is not jaundiced or pale.     Findings: No bruising, erythema, lesion or rash.  Neurological:     General: No focal deficit present.     Mental Status: He is alert and oriented to person, place, and time. Mental status is at baseline.  Psychiatric:        Mood and Affect: Mood normal.        Behavior: Behavior normal.        Thought Content: Thought content normal.        Judgment: Judgment normal.     Results for orders placed or performed during the hospital encounter of 03/19/24  Protime-INR   Collection Time: 03/19/24  4:00  PM  Result Value Ref Range   Prothrombin Time 33.9 (H) 11.4 - 15.2 seconds   INR 3.2 (H) 0.8 - 1.2  CBC   Collection Time: 03/19/24  4:00 PM  Result Value Ref Range   WBC 11.7 (H) 4.0 - 10.5 K/uL   RBC 5.15 4.22 - 5.81 MIL/uL   Hemoglobin 14.8 13.0 - 17.0 g/dL   HCT 54.3 60.9 - 47.9 %   MCV 88.5 80.0 - 100.0 fL   MCH 28.7 26.0 - 34.0 pg   MCHC 32.5 30.0 - 36.0 g/dL   RDW 84.0 (H) 88.4 - 84.4 %   Platelets 341 150 - 400 K/uL   nRBC 0.0 0.0 - 0.2 %  Brain natriuretic peptide   Collection Time: 03/19/24  4:00 PM  Result Value Ref Range   B Natriuretic Peptide 773.9 (H) 0.0 - 100.0 pg/mL  Basic metabolic panel with GFR   Collection Time: 03/19/24  4:00 PM  Result Value Ref Range   Sodium 138 135 - 145 mmol/L   Potassium 3.6 3.5 - 5.1 mmol/L   Chloride 102 98 - 111 mmol/L   CO2 26 22 - 32 mmol/L   Glucose, Bld 104 (H) 70 - 99 mg/dL   BUN 24 (H) 8 - 23 mg/dL   Creatinine, Ser 7.88 (H) 0.61 - 1.24 mg/dL   Calcium  9.2 8.9 - 10.3 mg/dL   GFR, Estimated 33 (L) >60 mL/min   Anion gap 10 5 - 15   *Note: Due to a large number of results and/or encounters for the requested time period, some results have not been displayed. A complete set of results can be found in Results Review.      Assessment & Plan:   Problem List Items Addressed This Visit   None Visit Diagnoses       Supratherapeutic INR    -  Primary   INR back to 4.7- will cut down to 1mg  daily and recheck in 5 days. Call with any concerns.   Relevant Orders   CoaguChek XS/INR Waived        Follow up plan: Return Tuesday 8AM ok to double book.

## 2024-03-20 NOTE — Assessment & Plan Note (Signed)
 Continue midodrine . BP still running quite low. Due to see cardiology this week. Encouraged fluids. Call with any concerns.

## 2024-03-20 NOTE — Assessment & Plan Note (Signed)
 Rechecking labs today. Await results. Treat as needed.

## 2024-03-21 ENCOUNTER — Encounter: Payer: Self-pay | Admitting: Cardiology

## 2024-03-21 ENCOUNTER — Ambulatory Visit
Admission: RE | Admit: 2024-03-21 | Discharge: 2024-03-21 | Disposition: A | Discharge status: Home / Self Care | Attending: Cardiology | Admitting: Cardiology

## 2024-03-21 ENCOUNTER — Encounter
Admission: RE | Disposition: A | Discharge status: Home / Self Care | Payer: Self-pay | Source: Home / Self Care | Attending: Cardiology

## 2024-03-21 DIAGNOSIS — Z952 Presence of prosthetic heart valve: Secondary | ICD-10-CM | POA: Insufficient documentation

## 2024-03-21 DIAGNOSIS — R1310 Dysphagia, unspecified: Secondary | ICD-10-CM | POA: Diagnosis not present

## 2024-03-21 DIAGNOSIS — I359 Nonrheumatic aortic valve disorder, unspecified: Secondary | ICD-10-CM | POA: Diagnosis not present

## 2024-03-21 DIAGNOSIS — I7121 Aneurysm of the ascending aorta, without rupture: Secondary | ICD-10-CM | POA: Diagnosis not present

## 2024-03-21 DIAGNOSIS — Z7901 Long term (current) use of anticoagulants: Secondary | ICD-10-CM | POA: Insufficient documentation

## 2024-03-21 DIAGNOSIS — G4733 Obstructive sleep apnea (adult) (pediatric): Secondary | ICD-10-CM | POA: Diagnosis not present

## 2024-03-21 DIAGNOSIS — I428 Other cardiomyopathies: Secondary | ICD-10-CM | POA: Diagnosis not present

## 2024-03-21 DIAGNOSIS — I272 Pulmonary hypertension, unspecified: Secondary | ICD-10-CM | POA: Insufficient documentation

## 2024-03-21 DIAGNOSIS — Z79899 Other long term (current) drug therapy: Secondary | ICD-10-CM | POA: Diagnosis not present

## 2024-03-21 DIAGNOSIS — I5022 Chronic systolic (congestive) heart failure: Secondary | ICD-10-CM | POA: Insufficient documentation

## 2024-03-21 DIAGNOSIS — Z95810 Presence of automatic (implantable) cardiac defibrillator: Secondary | ICD-10-CM | POA: Diagnosis not present

## 2024-03-21 DIAGNOSIS — F17210 Nicotine dependence, cigarettes, uncomplicated: Secondary | ICD-10-CM | POA: Diagnosis not present

## 2024-03-21 DIAGNOSIS — I2510 Atherosclerotic heart disease of native coronary artery without angina pectoris: Secondary | ICD-10-CM | POA: Diagnosis not present

## 2024-03-21 HISTORY — PX: RIGHT HEART CATH: CATH118263

## 2024-03-21 LAB — POCT I-STAT EG7
Acid-Base Excess: 2 mmol/L (ref 0.0–2.0)
Acid-Base Excess: 3 mmol/L — ABNORMAL HIGH (ref 0.0–2.0)
Bicarbonate: 27.3 mmol/L (ref 20.0–28.0)
Bicarbonate: 28.6 mmol/L — ABNORMAL HIGH (ref 20.0–28.0)
Calcium, Ion: 1.19 mmol/L (ref 1.15–1.40)
Calcium, Ion: 1.22 mmol/L (ref 1.15–1.40)
HCT: 41 % (ref 39.0–52.0)
HCT: 41 % (ref 39.0–52.0)
Hemoglobin: 13.9 g/dL (ref 13.0–17.0)
Hemoglobin: 13.9 g/dL (ref 13.0–17.0)
O2 Saturation: 57 %
O2 Saturation: 66 %
Potassium: 2.9 mmol/L — ABNORMAL LOW (ref 3.5–5.1)
Potassium: 3 mmol/L — ABNORMAL LOW (ref 3.5–5.1)
Sodium: 142 mmol/L (ref 135–145)
Sodium: 142 mmol/L (ref 135–145)
TCO2: 29 mmol/L (ref 22–32)
TCO2: 30 mmol/L (ref 22–32)
pCO2, Ven: 42.4 mmHg — ABNORMAL LOW (ref 44–60)
pCO2, Ven: 44.3 mmHg (ref 44–60)
pH, Ven: 7.417 (ref 7.25–7.43)
pH, Ven: 7.417 (ref 7.25–7.43)
pO2, Ven: 30 mmHg — CL (ref 32–45)
pO2, Ven: 34 mmHg (ref 32–45)

## 2024-03-21 LAB — GLUCOSE, CAPILLARY: Glucose-Capillary: 126 mg/dL — ABNORMAL HIGH (ref 70–99)

## 2024-03-21 SURGERY — RIGHT HEART CATH
Anesthesia: Moderate Sedation | Laterality: Right

## 2024-03-21 MED ORDER — SODIUM CHLORIDE 0.9 % IV SOLN: 250.0000 mL | INTRAVENOUS | Status: DC | PRN

## 2024-03-21 MED ORDER — TORSEMIDE 20 MG PO TABS: 20.0000 mg | ORAL_TABLET | Freq: Every day | ORAL | 3 refills | Status: DC

## 2024-03-21 MED ORDER — FREE WATER: 250.0000 mL | Freq: Once | Status: DC

## 2024-03-21 MED ORDER — SODIUM CHLORIDE 0.9% FLUSH: 3.0000 mL | Freq: Two times a day (BID) | INTRAVENOUS | Status: DC

## 2024-03-21 MED ORDER — ACETAMINOPHEN 325 MG PO TABS: 650.0000 mg | ORAL_TABLET | ORAL | Status: DC | PRN

## 2024-03-21 MED ORDER — LABETALOL HCL 5 MG/ML IV SOLN: 10.0000 mg | INTRAVENOUS | Status: DC | PRN

## 2024-03-21 MED ORDER — LIDOCAINE HCL (PF) 1 % IJ SOLN
INTRAMUSCULAR | Status: DC | PRN
  Administered 2024-03-21: 2 mL

## 2024-03-21 MED ORDER — ASPIRIN 81 MG PO CHEW: 81.0000 mg | CHEWABLE_TABLET | ORAL | Status: DC

## 2024-03-21 MED ORDER — SODIUM CHLORIDE 0.9% FLUSH: 3.0000 mL | INTRAVENOUS | Status: DC | PRN

## 2024-03-21 MED ORDER — MIDAZOLAM HCL 2 MG/2ML IJ SOLN
INTRAMUSCULAR | Status: AC
  Filled 2024-03-21: qty 2

## 2024-03-21 MED ORDER — HYDRALAZINE HCL 20 MG/ML IJ SOLN: 10.0000 mg | INTRAMUSCULAR | Status: DC | PRN

## 2024-03-21 MED ORDER — ONDANSETRON HCL 4 MG/2ML IJ SOLN: 4.0000 mg | Freq: Four times a day (QID) | INTRAMUSCULAR | Status: DC | PRN

## 2024-03-21 MED ORDER — HEPARIN (PORCINE) IN NACL 1000-0.9 UT/500ML-% IV SOLN
INTRAVENOUS | Status: AC
  Filled 2024-03-21: qty 1000

## 2024-03-21 MED ORDER — LIDOCAINE HCL 1 % IJ SOLN
INTRAMUSCULAR | Status: AC
  Filled 2024-03-21: qty 20

## 2024-03-21 MED ORDER — FENTANYL CITRATE (PF) 100 MCG/2ML IJ SOLN
INTRAMUSCULAR | Status: AC
  Filled 2024-03-21: qty 2

## 2024-03-21 SURGICAL SUPPLY — 7 items
CATH SWAN GANZ 7F STRAIGHT (CATHETERS) IMPLANT
DRAPE BRACHIAL (DRAPES) IMPLANT
GLIDESHEATH SLENDER 7FR .021G (SHEATH) IMPLANT
PACK CARDIAC CATH (CUSTOM PROCEDURE TRAY) ×1 IMPLANT
PANNUS RETENTION SYSTEM 2 PAD (MISCELLANEOUS) IMPLANT
SET ATX-X65L (MISCELLANEOUS) IMPLANT
STATION PROTECTION PRESSURIZED (MISCELLANEOUS) IMPLANT

## 2024-03-21 NOTE — Interval H&P Note (Signed)
 History and Physical Interval Note:  03/21/2024 11:37 AM  Roberto [name]  has presented today for surgery, with the diagnosis of R Cath    Chronic systolic HF.  The various methods of treatment have been discussed with the patient and family. After consideration of risks, benefits and other options for treatment, the patient has consented to  Procedure(s): RIGHT HEART CATH (Right) as a surgical intervention.  The patient's history has been reviewed, patient examined, no change in status, stable for surgery.  I have reviewed the patient's chart and labs.  Questions were answered to the patient's satisfaction.     [name] [employer]

## 2024-03-21 NOTE — Discharge Instructions (Addendum)
 Start back on torsemide  20 mg dailyRight Heart Cath, Care After This sheet gives you information about how to care for yourself after your procedure. Your health care provider may also give you more specific instructions. If you have problems or questions, contact your health care provider. What can I expect after the procedure? After the procedure, it is common to have: Bruising or mild discomfort in the area where the IV was inserted (insertion site). Follow these instructions at home: Eating and drinking  You may eat and drink after your procedure.  Drink a lot of fluids for the first several days after the procedure, as directed by your health care provider. This helps to wash (flush) the contrast out of your body. Examples of healthy fluids include water  or low-calorie drinks. General instructions Check your IV insertion area and also your venous access site every day for signs of infection. Check for: Redness, swelling, or pain. Fluid or blood. Warmth. Pus or a bad smell. Take over-the-counter and prescription medicines only as told by your health care provider. Rest and return to your normal activities as told by your health care provider. Ask your health care provider what activities are safe for you. Do not drive for 24 hours if you were given a medicine to help you relax (sedative), or until your health care provider approves. Keep all follow-up visits as told by your health care provider. This is important. Contact a health care provider if: Your skin becomes itchy or you develop a rash or hives. You have a fever that does not get better with medicine. You feel nauseous. You vomit. You have redness, swelling, or pain around the insertion site. You have fluid or blood coming from the insertion site. Your insertion area feels warm to the touch. You have pus or a bad smell coming from the insertion site. Get help right away if: You have difficulty breathing or shortness of  breath. You develop chest pain. You faint. You feel very dizzy. These symptoms may represent a serious problem that is an emergency. Do not wait to see if the symptoms will go away. Get medical help right away. Call your local emergency services (911 in the [place] Do not drive yourself to the hospital. Summary After your procedure, it is common to have bruising or mild discomfort in the area where the IV was inserted. You should check your IV insertion area every day for signs of infection. Take over-the-counter and prescription medicines only as told by your health care provider. You should drink a lot of fluids for the first several days after the procedure to help flush the contrast from your body. This information is not intended to replace advice given to you by your health care provider. Make sure you discuss any questions you have with your health care provider. Document Released: 04/09/2013 Document Revised: 06/01/2017 Document Reviewed: 05/13/2016 Elsevier Patient Education  2020 [employer].

## 2024-03-21 NOTE — Progress Notes (Signed)
 Per MD patient may leave after 1/2 hour if ambulates and vitals are stable. Patient is doing well. Has ambulated and vitals are stable. Will discharge to home.

## 2024-03-23 ENCOUNTER — Other Ambulatory Visit: Payer: Self-pay | Admitting: Family Medicine

## 2024-03-23 ENCOUNTER — Other Ambulatory Visit: Payer: Self-pay | Admitting: Physician Assistant

## 2024-03-23 NOTE — Progress Notes (Addendum)
 [facility] Return Visit   Referring Provider Vicci [address] DO [address] [place],  [place] [address]  Primary Care Provider [name], DO  Patient Profile: Roberto [name] is a 68 y.o. male who returns to the [facility] G[facility]enterology office for follow-up of the problem(s) noted below.  Problem List: Family history of colon or rectal cancer in 2 first-degree relatives-mother and father Elevated enzymes Hepatic steatosis Dysphagia and epigastric discomfort   History of Present Illness   Roberto [name] was last seen in the GI office 10/10/2023   Current GI Meds  None  Interval History   Discussed the use of AI scribe software for clinical note transcription with the patient, who gave verbal consent to proceed.  History of Present Illness Roberto [name] is a 68 y.o. male with a history of HTN, NICM last EF 25-30% 09/2023, aortic aneurysm, T2DM, CAD, valvular heart disease s/p AVR on coumadin , atrial fibrillations/p cardioversion 08/28/2023, V. Tach s/p AICD/pacemaker, HLD, TIA 2016, CKD 3, COPD, who returns to the gastroenterology office for evaluation and management of elevated liver enzymes and dysphagia/epigastric discomfort  Roberto [name] is accompanied to the office today by his wife who provides history in conjunction with him  Dysphagia and persistent dry heaves - Difficulty swallowing and persistent dry heaves for seven weeks, beginning after pacemaker replacement 7 weeks ago [facility] - did not have any swallowing issues before pacemaker placement - Sensation of something in the throat - Dry heaves multiple times daily, sometimes with phlegm or stomach acid - Episodes last up to fifteen minutes, occurring every morning and several times throughout the day - Unable to eat solid foods, relying on milkshakes and small sips of water  for nutrition and hydration - Attempts to eat soft foods such as mashed potatoes and rice result in minimal intake - Emergency hydration required  due to dehydration  - His wife inquires about an EGD -discussed that he is high risk for anesthesia and I would recommend initiating evaluation with a barium swallow -would pursue EGD if radiographic studies showed abnormalities that need to be pursued  Hepatic steatosis and elevated liver enzymes - No history of liver disease, hepatitis, or jaundice - Tattoos performed with clean needles - No history of intravenous drug use - No known exposure to hepatitis viruses - No travel to high-risk areas for liver disease except for [place] and [place] during [place] - Heavy weekend alcohol use during [place], ceased in 2005 after rehabilitation - No current alcohol use since 2005  - Liver ultrasound with Doppler flow 11/03/2023: Limited study, fatty infiltration of the liver - CTAP 03/05/2024: Accentuated density of the liver raising possibility of hemochromatosis - RUQ ultrasound 03/07/2024: Nonspecific increased liver echotexture compatible with intrinsic liver disease - Hepatic transaminases have been elevated 50-60 - Most recent CMP 03/17/2024 showed transaminases at the high end of normal-AST 37, ALT 44   GI Review of Symptoms Significant for dysphagia otherwise negative.  General Review of Systems  Review of systems is significant for the pertinent positives and negatives as listed per the HPI.  Full ROS is otherwise negative.  Past Medical History   Past Medical History:  Diagnosis Date   Aneurysm    Bicuspid aortic valve    a. s/p #27 Carbomedics mechanical valve on 03/25/2010; b. on Coumadin ; c. TTE 12/17: EF 40-45%, moderately dilated LV with moderate LVH, AVR well-seated with 14 mmHg gradient, peak AV velocity 2.5 m/s, mild mitral valve thickening with mild MR, mildly  dilated RV with mildly reduced contraction   Cellulitis    CHF (congestive heart failure) (HCC)    Chronic kidney disease    Chronic systolic CHF (congestive heart failure) (HCC)    a. R/LHC  03/2010 showed no significant CAD, LVEDP 31 mmHg, mean AoV gradient 34 mmHg at rest and 47 mmHg with dobutamine 20 mcg/kg/min, AVA 1.0 cm^2, RA 31, RV 68/25, PA 68/47, PCWP 38. PA sat 65%. CO 6.2 L/min (Fick) and 5.3 L/min (thermodilution)   Clotting disorder    COPD (chronic obstructive pulmonary disease) (HCC)    H/O mechanical aortic valve replacement 03/25/2010   a. 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   Stage 3 chronic kidney disease (HCC)    Stroke [facility])    TIA (transient ischemic attack) 05/2014     Past Surgical History   Past Surgical History:  Procedure Laterality Date   AORTIC VALVE REPLACEMENT     AORTIC VALVE SURGERY     CARDIAC CATHETERIZATION  03/21/2010   No significant CAD. Severe aortic stenosis. Severely elevated left and right heart filling pressures.   CARDIAC SURGERY  2009   CHF   CARDIOVERSION N/A 08/28/2023   Procedure: CARDIOVERSION;  Surgeon: [name] Slain, MD;  Location: [facility] INVASIVE CV LAB;  Service: Cardiovascular;  Laterality: N/A;   CARPAL TUNNEL RELEASE Left 2005   COLONOSCOPY N/A 12/13/2023   Procedure: COLONOSCOPY;  Surgeon: Suzann Inocente HERO, MD;  Location: [facility] ENDOSCOPY;  Service: Gastroenterology;  Laterality: N/A;   ICD IMPLANT N/A 06/25/2023   Procedure: ICD IMPLANT;  Surgeon: Waddell Danelle ORN, MD;  Location: [facility] INVASIVE CV LAB;  Service: Cardiovascular;  Laterality: N/A;   PACEMAKER IMPLANT     POLYPECTOMY  12/13/2023   Procedure: POLYPECTOMY, INTESTINE;  Surgeon: Suzann Inocente HERO, MD;  Location: [facility] ENDOSCOPY;  Service: Gastroenterology;;   RIGHT HEART CATH Right 03/21/2024   Procedure: RIGHT HEART CATH;  Surgeon: Rolan Ezra RAMAN, MD;  Location: [facility] INVASIVE CV LAB;  Service: Cardiovascular;  Laterality: Right;   RIGHT HEART CATH AND CORONARY ANGIOGRAPHY N/A 01/28/2019   Procedure: RIGHT  HEART CATH AND CORONARY ANGIOGRAPHY;  Surgeon: [name], MD;  Location: [facility] INVASIVE CV LAB;  Service: Cardiovascular;  Laterality: N/A;   RIGHT HEART CATH AND CORONARY ANGIOGRAPHY N/A 06/20/2023   Procedure: RIGHT HEART CATH AND CORONARY ANGIOGRAPHY;  Surgeon: [name], MD;  Location: [facility] INVASIVE CV LAB;  Service: Cardiovascular;  Laterality: N/A;   TONSILLECTOMY  1962     Allergies and Medications   Allergies  Allergen Reactions   Nicotine  Hives and Rash    Patches caused localized rash and hives     Current Meds  Medication Sig   albuterol  (PROVENTIL ) (2.5 MG/3ML) 0.083% nebulizer solution USE 1 VIAL IN NEBULIZER EVERY 6 HOURS AS NEEDED FOR WHEEZING FOR SHORTNESS OF BREATH   allopurinol  (ZYLOPRIM ) 100 MG tablet Take 1 tablet (100 mg total) by mouth daily.   baclofen  (LIORESAL ) 10 MG tablet Take 0.5-1 tablets (5-10 mg total) by mouth at bedtime as needed.   buPROPion  (WELLBUTRIN  XL) 300 MG 24 hr tablet Take 300 mg by mouth daily.   dapagliflozin  propanediol (FARXIGA ) 10 MG TABS tablet Take 1 tablet (10 mg total) by mouth daily before breakfast.   fenofibrate  (TRICOR ) 48 MG tablet Take 48 mg by mouth daily.   Fluticasone -Umeclidin-Vilant (  TRELEGY ELLIPTA ) 200-62.5-25 MCG/ACT AEPB Inhale 1 puff into the lungs daily in the afternoon.   losartan  (COZAAR ) 25 MG tablet Take 25 mg by mouth daily.   metFORMIN  (GLUCOPHAGE -XR) 500 MG 24 hr tablet Take 1 tablet by mouth once daily with breakfast   metoprolol  succinate (TOPROL -XL) 50 MG 24 hr tablet Take 1 tablet (50 mg total) by mouth daily.   mexiletine (MEXITIL ) 150 MG capsule Take 150 mg by mouth 3 (three) times daily.   midodrine  (PROAMATINE ) 2.5 MG tablet Take 1 tablet (2.5 mg total) by mouth 2 (two) times daily with a meal.   nortriptyline  (PAMELOR ) 10 MG capsule Take 1 capsule by mouth at bedtime   Omega-3 Fatty Acids (OMEGA 3 PO) Take 1,500 mg by mouth 2 (two) times daily.   pantoprazole  (PROTONIX ) 40 MG tablet Take 1  tablet (40 mg total) by mouth daily.   rosuvastatin  (CRESTOR ) 40 MG tablet Take 1 tablet (40 mg total) by mouth daily.   Spacer/Aero-Holding Raguel FRENCH Use as directed Dx: COPD, J44.9   torsemide  (DEMADEX ) 20 MG tablet Take 1 tablet (20 mg total) by mouth daily.   traZODone  (DESYREL ) 50 MG tablet Take 0.5 tablets (25 mg total) by mouth at bedtime as needed for sleep.   VENTOLIN  HFA 108 (90 Base) MCG/ACT inhaler INHALE 2 PUFFS BY MOUTH EVERY 6 HOURS AS NEEDED FOR WHEEZING OR SHORTNESS OF BREATH   warfarin (COUMADIN ) 1 MG tablet Take 1 tablet (1 mg total) by mouth daily. (Patient taking differently: Take 2 mg by mouth daily.)   [DISCONTINUED] amiodarone  (PACERONE ) 200 MG tablet Take 200 mg by mouth daily.   Current Facility-Administered Medications for the 03/24/24 encounter (Office Visit) with Suzann Inocente HERO, MD  Medication   albuterol  (PROVENTIL ) (2.5 MG/3ML) 0.083% nebulizer solution 2.5 mg     Family History   Family History  Problem Relation Age of Onset   Arthritis Mother    Dementia Mother    Colon cancer Mother    Arthritis Father    Diabetes Father    Stroke Father    Colon cancer Father    Heart attack Brother    Breast cancer Sister    Seizures Sister    Cancer Brother        brain   Heart disease Brother    Heart attack Brother      Social History   Social History   Tobacco Use   Smoking status: Every Day    Current packs/day: 0.20    Average packs/day: 0.5 packs/day for 48.2 years (23.7 ttl pk-yrs)    Types: Cigarettes    Start date: 1974    Last attempt to quit: 2021   Smokeless tobacco: Never   Tobacco comments:    3 or less cigarettes per day  Vaping Use   Vaping status: Never Used  Substance Use Topics   Alcohol use: Never   Drug use: Never   Roberto [name] reports that he has been smoking cigarettes. He started smoking about 51 years ago. He has a 23.7 pack-year smoking history. He has never used smokeless tobacco. He reports that he does not drink  alcohol and does not use drugs.  Vital Signs and Physical Examination   Vitals:   03/24/24 1529  BP: 92/62  Pulse: 97    Body mass index is 37.83 kg/m. Weight: 234 lb 6 oz (106.3 kg)  General: Obese, chronically ill-appearing gentleman in no distress Head: Normocephalic and atraumatic Eyes: Sclerae anicteric, EOMI Lungs: Clear throughout  to auscultation Heart: Regular rate and rhythm; No murmurs, rubs or bruits Abdomen: Soft, non tender and non distended. No masses, hepatosplenomegaly or hernias noted. Normal Bowel sounds Rectal: Deferred Extremities: No edema or deformities noted   Review of Data  The following data was reviewed at the time of this encounter:  Laboratory Studies      Latest Ref Rng & Units 03/21/2024    1:21 PM 03/19/2024    4:00 PM 03/17/2024   11:04 AM  CBC  WBC 4.0 - 10.5 K/uL  11.7  9.7   Hemoglobin 13.0 - 17.0 g/dL 86.9 - 82.9 g/dL 86.0    86.0  85.1  85.9   Hematocrit 39.0 - 52.0 % 39.0 - 52.0 % 41.0    41.0  45.6  45.3   Platelets 150 - 400 K/uL  341  307     Lab Results  Component Value Date   LIPASE 51 03/07/2024      Latest Ref Rng & Units 03/25/2024    8:24 AM 03/21/2024    1:21 PM 03/19/2024    4:00 PM  CMP  Glucose 70 - 99 mg/dL 96   895   BUN 8 - 27 mg/dL 25   24   Creatinine 9.23 - 1.27 mg/dL 7.39   7.88   Sodium 865 - 144 mmol/L 142  142    142  138   Potassium 3.5 - 5.2 mmol/L 3.4  3.0    2.9  3.6   Chloride 96 - 106 mmol/L 98   102   CO2 20 - 29 mmol/L 24   26   Calcium  8.6 - 10.2 mg/dL 9.8   9.2    Lab Results  Component Value Date   FERRITIN 175 03/17/2024     Cologuard + 05/2023  09/14/23   INR 8.4  Imaging Studies  RUQ ultrasound 03/07/2024 1. Nonspecific increased liver echotexture, compatible with intrinsic liver disease. This could correspond to the suspected hemochromatosis seen on prior CT exam. 2. Lobular appearance of the right kidney as above, likely reflecting significant right renal cortical  scarring. Dedicated imaging of the right kidney may be useful to exclude underlying mass.    CTAP 03/05/2024 1. Accentuated density in the liver, parenchyma measuring at 106 Hounsfield units on this noncontrast exam, raising the possibility of hemochromatosis. 2. Faint clustered tree-in-bud nodularity in the right lower lobe, favoring atypical infectious bronchiolitis. 3. Mild to moderate cardiomegaly. 4. Dependent density in the gallbladder is indistinct and may be from sludge or gallstones. 5. Bilateral foraminal impingement at L5-S1 due to facet arthropathy, intervertebral spurring, and a suspected nitrogen gas filled disc protrusion in the right neural foramen. 6.  Aortic Atherosclerosis (ICD10-I70.0).    Liver ultrasound with Doppler flow 11/03/2023 1. Technically limited study. 2. Fatty infiltration of the liver. 3. No evidence of portal vein thrombosis.  ECHO 09/27/2023   1. Left ventricular ejection fraction, by estimation, is 25 to 30%. Left  ventricular ejection fraction by 3D volume is 28 %. The left ventricle has  severely decreased function. The left ventricle demonstrates global  hypokinesis. The left ventricular  internal cavity size was severely dilated. Left ventricular diastolic  parameters are consistent with Grade II diastolic dysfunction  (pseudonormalization). The average left ventricular global longitudinal  strain is -10.5 %. The global longitudinal strain  is abnormal.   2. Right ventricular systolic function is mildly reduced. The right  ventricular size is normal.   3. Left atrial size was mild to  moderately dilated.   4. Right atrial size was moderately dilated.   5. The mitral valve is degenerative. Mild to moderate mitral valve  regurgitation. No evidence of mitral stenosis.   6. Tricuspid valve regurgitation is mild to moderate.   7. The aortic valve has been repaired/replaced. Aortic valve  regurgitation versus perivalvular leak is trivial. Mild  to moderate aortic  valve stenosis with at least some component of patient-prosthesis  mismatch. Aortic valve mean gradient measures 12.6  mmHg.   8. There is mild dilatation of the aortic root, measuring 43 mm. Known  severe dilation of the ascending aorta is not well-visualized on this  examination.   GI Procedures and Studies  Colonoscopy 12/13/2023 2 polyps -lymphoid aggregate and hyperplastic polyp with prolapse changes    Clinical Impression  It is my clinical impression that Roberto [name] is a 68 y.o. male with a history of HTN, NICM last EF 25-30% 09/2023, aortic aneurysm, T2DM, CAD, valvular heart disease s/p AVR on coumadin , atrial fibrillation s/p cardioversion 08/28/2023, V. Tach s/p AICD/pacemaker, HLD, TIA 2016, CKD 3, COPD who is referred for;  Family history of colon or rectal cancer in 2 first-degree relatives-mother and father Elevated enzymes Hepatic steatosis Dysphagia and epigastric discomfort  Roberto [name] returns to the office today to discuss his history of elevated liver enzymes and hepatic steatosis; incidentally he also reported symptoms of acute dysphagia and epigastric discomfort status post pacemaker placement 7 weeks ago.  With regard to his hepatology issues, he has had chronic mild elevation of his hepatic transaminases with AST/ALT elevated 50-60 range.  Total bilirubin and alkaline phosphatase have been normal.  Imaging studies have shown changes in his liver echotexture suggestive of hepatic steatosis.  He reports a prior history of heavy alcohol use but has abstained from alcohol since 2005.  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  He has a significant history of cardiac disease and CHF-congestive hepatopathy could be in the differential diagnosis.  His imaging studies raised a query of hemochromatosis, however, his iron studies are not compatible with this.  May very  likely have MASLD/MASH.We discussed performing laboratory studies to evaluate for chronic hepatitides today.  He also reports the onset of acute dysphagia since pacemaker placement 7 weeks ago.  He did not have any of these issues prior to his cardiac procedure.  He describes a sensation of food getting stuck in his epigastrium around the xiphoid process.  He has had difficulty tolerating solid food and has been attempting to maintain adequate hydration.  No prior history of dysphagia or esophageal problems.  We discussed proceeding with a barium esophagram.  Pending that result decisions can be made regarding need for EGD.  Given his medical comorbidities he is high risk for anesthesia and I would want to be thoughtful about indications before considering a procedure that requires sedation.  He previously had a positive Cologuard.  He reports a family history of colorectal cancer in both his mother and father.  Colonoscopy 12/2023 disclosed a lymphoid aggregate and hyperplastic polyp.  A 5-year follow-up colonoscopy was recommended in the setting of his family history.   Plan  Laboratory studies: TSH, IgG, IgA, TTG IgA, ANA, ASMA, alpha 1 antitrypsin phenotype, GGT, CK, HAV, HBV, HIV Follow LFTs Q 6 to 12 months Liver ultrasound Q 2 to 3 years Obtain upper GI series for further evaluation of dysphagia and epigastric discomfort PPI  trial with pantoprazole  40 mg p.o. twice daily 20 to 30 minutes before meal Next surveillance colonoscopy due 12/20/2028   Planned Follow Up 2 months  The patient or caregiver verbalized understanding of the material covered, with no barriers to understanding. All questions were answered. Patient or caregiver is agreeable with the plan outlined above.    It was a pleasure to see Roberto [name].  If you have any questions or concerns regarding this evaluation, do not hesitate to contact me.  Inocente Hausen, MD [facility]   I spent total of 30 minutes in both  face-to-face (20 minutes interview) and non-face-to-face (10 minutes chart review, care coordination, documentation)  activities, excluding procedures performed, for the visit on the date of this encounter.  "

## 2024-03-24 ENCOUNTER — Other Ambulatory Visit (INDEPENDENT_AMBULATORY_CARE_PROVIDER_SITE_OTHER)

## 2024-03-24 ENCOUNTER — Ambulatory Visit (INDEPENDENT_AMBULATORY_CARE_PROVIDER_SITE_OTHER): Admitting: Pediatrics

## 2024-03-24 ENCOUNTER — Encounter: Payer: Self-pay | Admitting: Cardiology

## 2024-03-24 VITALS — BP 92/62 | HR 97 | Ht 66.0 in | Wt 234.4 lb

## 2024-03-24 DIAGNOSIS — Z800 Family history of malignant neoplasm of digestive organs: Secondary | ICD-10-CM | POA: Diagnosis not present

## 2024-03-24 DIAGNOSIS — R748 Abnormal levels of other serum enzymes: Secondary | ICD-10-CM

## 2024-03-24 DIAGNOSIS — K30 Functional dyspepsia: Secondary | ICD-10-CM | POA: Insufficient documentation

## 2024-03-24 DIAGNOSIS — R1013 Epigastric pain: Secondary | ICD-10-CM

## 2024-03-24 DIAGNOSIS — R1310 Dysphagia, unspecified: Secondary | ICD-10-CM | POA: Diagnosis not present

## 2024-03-24 DIAGNOSIS — R1110 Vomiting, unspecified: Secondary | ICD-10-CM

## 2024-03-24 DIAGNOSIS — Z950 Presence of cardiac pacemaker: Secondary | ICD-10-CM

## 2024-03-24 DIAGNOSIS — K760 Fatty (change of) liver, not elsewhere classified: Secondary | ICD-10-CM | POA: Diagnosis not present

## 2024-03-24 DIAGNOSIS — G4733 Obstructive sleep apnea (adult) (pediatric): Secondary | ICD-10-CM | POA: Diagnosis not present

## 2024-03-24 LAB — GAMMA GT: GGT: 83 U/L — ABNORMAL HIGH (ref 7–51)

## 2024-03-24 LAB — CK: Total CK: 135 U/L (ref 17–232)

## 2024-03-24 LAB — TSH: TSH: 3.59 u[IU]/mL (ref 0.35–5.50)

## 2024-03-24 MED ORDER — PANTOPRAZOLE SODIUM 40 MG PO TBEC: 40.0000 mg | DELAYED_RELEASE_TABLET | Freq: Every day | ORAL | 3 refills | Status: DC

## 2024-03-24 NOTE — Patient Instructions (Addendum)
 Your provider has requested that you go to the basement level for lab work before leaving today. Press B on the elevator. The lab is located at the first door on the left as you exit the elevator.  Due to recent changes in healthcare laws, you may see the results of your imaging and laboratory studies on MyChart before your provider has had a chance to review them.  We understand that in some cases there may be results that are confusing or concerning to you. Not all laboratory results come back in the same time frame and the provider may be waiting for multiple results in order to interpret others.  Please give us  48 hours in order for your provider to thoroughly review all the results before contacting the office for clarification of your results.   We have sent the following medications to your pharmacy for you to pick up at your convenience:  Pantoprazole  40mg  once a day.  You have been scheduled for an Upper GI Series at [facility]. Your appointment is on 03/26/24 at 9:30 am. Please arrive 30 minutes prior to your test for registration. Make sure not to eat or drink anything after midnight on the night before your test. If you need to reschedule, please call radiology at [phone]. ________________________________________________________________ An upper GI series uses x rays to help diagnose problems of the upper GI tract, which includes the esophagus, stomach, and duodenum. The duodenum is the first part of the small intestine. An upper GI series is conducted by a radiology technologist or a radiologist--a doctor who specializes in x-ray imaging--at a hospital or outpatient center. While sitting or standing in front of an x-ray machine, the patient drinks barium liquid, which is often white and has a chalky consistency and taste. The barium liquid coats the lining of the upper GI tract and makes signs of disease show up more clearly on x rays. X-ray video, called  fluoroscopy, is used to view the barium liquid moving through the esophagus, stomach, and duodenum. Additional x rays and fluoroscopy are performed while the patient lies on an x-ray table. To fully coat the upper GI tract with barium liquid, the technologist or radiologist may press on the abdomen or ask the patient to change position. Patients hold still in various positions, allowing the technologist or radiologist to take x rays of the upper GI tract at different angles. If a technologist conducts the upper GI series, a radiologist will later examine the images to look for problems.  This test typically takes about 1 hour to complete. __________________________________________________________________  Your provider has ordered Diatherix stool testing for you. You have received a kit from our office today containing all necessary supplies to complete this test. Please carefully read the stool collection instructions provided in the kit before opening the accompanying materials. In addition, be sure there is a label providing your full name and date of birth on the puritan opti-swab tube that is supplied in the kit (if you do not see a label with this information on your test tube, please make us  aware before test collection!). After completing the test, you should secure the purtian tube into the specimen biohazard bag. The [facility] E-Req sheet (including date and time of specimen collection) should be placed into the outside pocket of the specimen biohazard bag and returned to the [place] lab (basement floor of [facility]) within 3 days of collection. Please make sure to give the specimen to a  staff member at the lab. DO NOT leave the specimen on the counter.   If the specimen date and time (can be found in the upper right boxed portion of the sheet) are not filled out on the E-Req sheet, the test will NOT be performed.     Follow in 2 months.  Thank you for  entrusting me with your care and for choosing [facility], Dr. Inocente Hausen   _______________________________________________________  If your blood pressure at your visit was 140/90 or greater, please contact your primary care physician to follow up on this.  _______________________________________________________  If you are age 86 or older, your body mass index should be between 23-30. Your Body mass index is 37.83 kg/m. If this is out of the aforementioned range listed, please consider follow up with your Primary Care Provider.  If you are age 80 or younger, your body mass index should be between 19-25. Your Body mass index is 37.83 kg/m. If this is out of the aformentioned range listed, please consider follow up with your Primary Care Provider.   ________________________________________________________  The [facility] providers would like to encourage you to use MYCHART to communicate with providers for non-urgent requests or questions.  Due to long hold times on the telephone, sending your provider a message by [facility] may be a faster and more efficient way to get a response.  Please allow 48 business hours for a response.  Please remember that this is for non-urgent requests.  _______________________________________________________  [facility] is using a team-based approach to care.  Your team is made up of your doctor and two to three APPS. Our APPS (Nurse Practitioners and Physician Assistants) work with your physician to ensure care continuity for you. They are fully qualified to address your health concerns and develop a treatment plan. They communicate directly with your gastroenterologist to care for you. Seeing the Advanced Practice Practitioners on your physician's team can help you by facilitating care more promptly, often allowing for earlier appointments, access to diagnostic testing, procedures, and other specialty referrals.

## 2024-03-24 NOTE — Telephone Encounter (Signed)
 Requested medication (s) are due for refill today - unsure  Requested medication (s) are on the active medication list -yes- not at this dose  Future visit scheduled -no  Last refill: 08/16/23  Notes to clinic: listed as historical, different dosing listed on medication list- sent for review   Requested Prescriptions  Pending Prescriptions Disp Refills   buPROPion  (WELLBUTRIN  SR) 150 MG 12 hr tablet [Pharmacy Med Name: buPROPion  HCl ER (SR) 150 MG Oral Tablet Extended Release 12 Hour] 60 tablet 0    Sig: Take 1 tablet by mouth twice daily     Psychiatry: Antidepressants - bupropion  Failed - 03/24/2024  4:22 PM      Failed - Cr in normal range and within 360 days    Creatinine  Date Value Ref Range Status  05/25/2014 0.85 0.60 - 1.30 mg/dL Final   Creatinine, Ser  Date Value Ref Range Status  03/19/2024 2.11 (H) 0.61 - 1.24 mg/dL Final         Passed - AST in normal range and within 360 days    AST  Date Value Ref Range Status  03/17/2024 37 0 - 40 IU/L Final   SGOT(AST)  Date Value Ref Range Status  05/24/2014 22 15 - 37 Unit/L Final   AST (SGOT) Piccolo, Waived  Date Value Ref Range Status  05/17/2017 31 11 - 38 U/L Final         Passed - ALT in normal range and within 360 days    ALT  Date Value Ref Range Status  03/17/2024 44 0 - 44 IU/L Final   SGPT (ALT)  Date Value Ref Range Status  05/24/2014 20 U/L Final    Comment:    14-63 NOTE: New Reference Range 01/20/14    ALT (SGPT) Piccolo, Waived  Date Value Ref Range Status  05/17/2017 47 10 - 47 U/L Final         Passed - Completed PHQ-2 or PHQ-9 in the last 360 days      Passed - Last BP in normal range    BP Readings from Last 1 Encounters:  03/24/24 92/62         Passed - Valid encounter within last 6 months    Recent Outpatient Visits           4 days ago Supratherapeutic INR   [place] [facility], [name], DO   1 week ago Supratherapeutic INR   [place]  [facility] [place], [place] P, DO   2 weeks ago H/O mechanical aortic valve replacement   [place] [facility] [place], [name], DO   3 weeks ago H/O mechanical aortic valve replacement   [place] [facility] [place], [name], DO   3 weeks ago H/O mechanical aortic valve replacement   [place] [name] [place], Duwaine SQUIBB, DO       Future Appointments             In 2 months End, [name], MD [facility]               Requested Prescriptions  Pending Prescriptions Disp Refills   buPROPion  (WELLBUTRIN  SR) 150 MG 12 hr tablet [Pharmacy Med Name: buPROPion  HCl ER (SR) 150 MG Oral Tablet Extended Release 12 Hour] 60 tablet 0    Sig: Take 1 tablet by mouth twice daily     Psychiatry: Antidepressants - bupropion  Failed - 03/24/2024  4:22 PM  Failed - Cr in normal range and within 360 days    Creatinine  Date Value Ref Range Status  05/25/2014 0.85 0.60 - 1.30 mg/dL Final   Creatinine, Ser  Date Value Ref Range Status  03/19/2024 2.11 (H) 0.61 - 1.24 mg/dL Final         Passed - AST in normal range and within 360 days    AST  Date Value Ref Range Status  03/17/2024 37 0 - 40 IU/L Final   SGOT(AST)  Date Value Ref Range Status  05/24/2014 22 15 - 37 Unit/L Final   AST (SGOT) Piccolo, Waived  Date Value Ref Range Status  05/17/2017 31 11 - 38 U/L Final         Passed - ALT in normal range and within 360 days    ALT  Date Value Ref Range Status  03/17/2024 44 0 - 44 IU/L Final   SGPT (ALT)  Date Value Ref Range Status  05/24/2014 20 U/L Final    Comment:    14-63 NOTE: New Reference Range 01/20/14    ALT (SGPT) Piccolo, Waived  Date Value Ref Range Status  05/17/2017 47 10 - 47 U/L Final         Passed - Completed PHQ-2 or PHQ-9 in the last 360 days      Passed - Last BP in normal range    BP Readings from Last 1 Encounters:  03/24/24 92/62         Passed -  Valid encounter within last 6 months    Recent Outpatient Visits           4 days ago Supratherapeutic INR   [place] [facility] [place], [name], DO   1 week ago Supratherapeutic INR   [facility] [place], [place] P, DO   2 weeks ago H/O mechanical aortic valve replacement   [place] [facility] [place], [name], DO   3 weeks ago H/O mechanical aortic valve replacement   [facility], [name], DO   3 weeks ago H/O mechanical aortic valve replacement   [place] [facility] [place], Duwaine SQUIBB, DO       Future Appointments             In 2 months End, [name], MD [facility]

## 2024-03-25 ENCOUNTER — Ambulatory Visit: Payer: 59 | Attending: Family Medicine

## 2024-03-25 ENCOUNTER — Ambulatory Visit: Payer: Self-pay

## 2024-03-25 ENCOUNTER — Ambulatory Visit (INDEPENDENT_AMBULATORY_CARE_PROVIDER_SITE_OTHER): Admitting: Family Medicine

## 2024-03-25 ENCOUNTER — Other Ambulatory Visit: Payer: Self-pay

## 2024-03-25 ENCOUNTER — Other Ambulatory Visit: Payer: Self-pay | Admitting: Family Medicine

## 2024-03-25 ENCOUNTER — Telehealth: Payer: Self-pay

## 2024-03-25 VITALS — BP 89/67 | HR 76 | Temp 97.9°F | Ht 66.0 in | Wt 235.2 lb

## 2024-03-25 DIAGNOSIS — R791 Abnormal coagulation profile: Secondary | ICD-10-CM | POA: Diagnosis not present

## 2024-03-25 DIAGNOSIS — I5022 Chronic systolic (congestive) heart failure: Secondary | ICD-10-CM

## 2024-03-25 DIAGNOSIS — E861 Hypovolemia: Secondary | ICD-10-CM

## 2024-03-25 DIAGNOSIS — G4733 Obstructive sleep apnea (adult) (pediatric): Secondary | ICD-10-CM

## 2024-03-25 LAB — COAGUCHEK XS/INR WAIVED
INR: 5.8 (ref 0.9–1.1)
Prothrombin Time: 70.1 s

## 2024-03-25 MED ORDER — ENOXAPARIN SODIUM 120 MG/0.8ML IJ SOSY: 1.0000 mg/kg | PREFILLED_SYRINGE | Freq: Two times a day (BID) | INTRAMUSCULAR | 3 refills | Status: DC

## 2024-03-25 NOTE — Progress Notes (Signed)
 BP (!) 89/67 (BP Location: Left Arm, Patient Position: Sitting, Cuff Size: Normal)   Pulse 76   Temp 97.9 F (36.6 C) (Oral)   Ht 5' 6 (1.676 m)   Wt 235 lb 3.2 oz (106.7 kg)   BMI 37.96 kg/m    Subjective:    Patient ID: Roberto [name], male    DOB: [birth year], 68 y.o.   MRN: [id number]  HPI: Eliza Grissinger is a 68 y.o. male  Chief Complaint  Patient presents with   Coagulation Disorder   Coumadin  Management.  The expected duration of coumadin  treatment is lifelong The reason for anticoagulation is  mechanical heart valve.  Present Coumadin  dose: 2 mg Goal: 2.5-3.5  Excessive bruising: no Nose bleeding: no Rectal bleeding: no Prolonged menstrual cycles: N/A Eating diet with consistent amounts of foods containing Vitamin K:no Any recent antibiotic use? no  Relevant past medical, surgical, family and social history reviewed and updated as indicated. Interim medical history since our last visit reviewed. Allergies and medications reviewed and updated.  Review of Systems  Constitutional: Negative.   Respiratory: Negative.    Cardiovascular: Negative.   Musculoskeletal: Negative.   Skin: Negative.   Neurological: Negative.   Psychiatric/Behavioral: Negative.      Per HPI unless specifically indicated above     Objective:    BP (!) 89/67 (BP Location: Left Arm, Patient Position: Sitting, Cuff Size: Normal)   Pulse 76   Temp 97.9 F (36.6 C) (Oral)   Ht 5' 6 (1.676 m)   Wt 235 lb 3.2 oz (106.7 kg)   BMI 37.96 kg/m   Wt Readings from Last 3 Encounters:  03/28/24 236 lb (107 kg)  03/28/24 236 lb (107 kg)  03/28/24 236 lb (107 kg)    Physical Exam Vitals and nursing note reviewed.  Constitutional:      General: He is not in acute distress.    Appearance: Normal appearance. He is obese. He is not ill-appearing, toxic-appearing or diaphoretic.  HENT:     Head: Normocephalic and atraumatic.     Right Ear: External ear normal.     Left Ear: External ear  normal.     Nose: Nose normal.     Mouth/Throat:     Mouth: Mucous membranes are moist.     Pharynx: Oropharynx is clear.  Eyes:     General: No scleral icterus.       Right eye: No discharge.        Left eye: No discharge.     Extraocular Movements: Extraocular movements intact.     Conjunctiva/sclera: Conjunctivae normal.     Pupils: Pupils are equal, round, and reactive to light.  Cardiovascular:     Rate and Rhythm: Normal rate and regular rhythm.     Pulses: Normal pulses.     Heart sounds: Normal heart sounds. No murmur heard.    No friction rub. No gallop.  Pulmonary:     Effort: Pulmonary effort is normal. No respiratory distress.     Breath sounds: Normal breath sounds. No stridor. No wheezing, rhonchi or rales.  Chest:     Chest wall: No tenderness.  Musculoskeletal:        General: Normal range of motion.     Cervical back: Normal range of motion and neck supple.  Skin:    General: Skin is warm and dry.     Capillary Refill: Capillary refill takes less than 2 seconds.     Coloration: Skin is not jaundiced or  pale.     Findings: No bruising, erythema, lesion or rash.  Neurological:     General: No focal deficit present.     Mental Status: He is alert and oriented to person, place, and time. Mental status is at baseline.  Psychiatric:        Mood and Affect: Mood normal.        Behavior: Behavior normal.        Thought Content: Thought content normal.        Judgment: Judgment normal.     Results for orders placed or performed in visit on 03/25/24  CoaguChek XS/INR Waived   Collection Time: 03/25/24  8:12 AM  Result Value Ref Range   INR 5.8 (HH) 0.9 - 1.1   Prothrombin Time 70.1 sec  Basic metabolic panel with GFR   Collection Time: 03/25/24  8:24 AM  Result Value Ref Range   Glucose 96 70 - 99 mg/dL   BUN 25 8 - 27 mg/dL   Creatinine, Ser 7.39 (H) 0.76 - 1.27 mg/dL   eGFR 26 (L) >40 fO/fpw/8.26   BUN/Creatinine Ratio 10 10 - 24   Sodium 142 134 - 144  mmol/L   Potassium 3.4 (L) 3.5 - 5.2 mmol/L   Chloride 98 96 - 106 mmol/L   CO2 24 20 - 29 mmol/L   Calcium  9.8 8.6 - 10.2 mg/dL   *Note: Due to a large number of results and/or encounters for the requested time period, some results have not been displayed. A complete set of results can be found in Results Review.      Assessment & Plan:   Problem List Items Addressed This Visit       Cardiovascular and Mediastinum   Hypotension   Rechecking labs today. Await results.       Relevant Orders   Basic metabolic panel with GFR (Completed)   Other Visit Diagnoses       Supratherapeutic INR    -  Primary   Given inability to control INR and need to avoid clots, will change him to weight based lovenox  while he continues work up with specialists. Call with concerns.   Relevant Orders   CoaguChek XS/INR Waived (Completed)        Follow up plan: Return in about 1 week (around 04/01/2024).

## 2024-03-25 NOTE — Progress Notes (Signed)
   03/25/2024  Patient ID: Roberto [name], male   DOB: 1955/07/09, 68 y.o.   MRN: [id number]  Clinic routed request from PCP for patient/caregiver counseling on enoxaparin  administration. Patient has been prescribed enoxaparin  1mg /kg BID based on inability to maintain target INR with warfarin in patient with mechanical valve.  Patient has used this medication before, but based on current weight, there is not a prefilled syringe available with the exact dose of 108mg  the patient will need.  Medication will be filled as 120mg /0.14mL syringe.  Advised caregiver on procedure to waste air bubble in syringe along with 0.58mL of medication to leave a dose of 108mg  (or 0.19ml).  Patient had some 120mg  enoxaparin  syringes on hand from a previous fill, so she used these to practice while we were on the phone and feels comfortable administering the correct dose.  Counseled on administration site, technique, and dosing frequency of every 12 hours.  Roberto [name] (caregiver) has my direct number to call with any questions or concerns.  Roberto [name], PharmD, DPLA

## 2024-03-26 ENCOUNTER — Ambulatory Visit
Admission: RE | Admit: 2024-03-26 | Discharge: 2024-03-26 | Discharge status: Home / Self Care | Source: Ambulatory Visit | Attending: Pediatrics | Admitting: Pediatrics

## 2024-03-26 ENCOUNTER — Other Ambulatory Visit: Payer: Self-pay

## 2024-03-26 ENCOUNTER — Emergency Department

## 2024-03-26 ENCOUNTER — Emergency Department
Admission: EM | Admit: 2024-03-26 | Discharge: 2024-03-26 | Disposition: A | Discharge status: Home / Self Care | Attending: Emergency Medicine | Admitting: Emergency Medicine

## 2024-03-26 ENCOUNTER — Encounter: Payer: Self-pay | Admitting: Family Medicine

## 2024-03-26 DIAGNOSIS — J449 Chronic obstructive pulmonary disease, unspecified: Secondary | ICD-10-CM | POA: Diagnosis not present

## 2024-03-26 DIAGNOSIS — S8002XA Contusion of left knee, initial encounter: Secondary | ICD-10-CM | POA: Insufficient documentation

## 2024-03-26 DIAGNOSIS — E1122 Type 2 diabetes mellitus with diabetic chronic kidney disease: Secondary | ICD-10-CM | POA: Diagnosis not present

## 2024-03-26 DIAGNOSIS — S8001XA Contusion of right knee, initial encounter: Secondary | ICD-10-CM | POA: Insufficient documentation

## 2024-03-26 DIAGNOSIS — S80211A Abrasion, right knee, initial encounter: Secondary | ICD-10-CM | POA: Diagnosis not present

## 2024-03-26 DIAGNOSIS — W19XXXA Unspecified fall, initial encounter: Secondary | ICD-10-CM | POA: Diagnosis not present

## 2024-03-26 DIAGNOSIS — S40812A Abrasion of left upper arm, initial encounter: Secondary | ICD-10-CM | POA: Diagnosis not present

## 2024-03-26 DIAGNOSIS — S40811A Abrasion of right upper arm, initial encounter: Secondary | ICD-10-CM | POA: Insufficient documentation

## 2024-03-26 DIAGNOSIS — I4891 Unspecified atrial fibrillation: Secondary | ICD-10-CM | POA: Insufficient documentation

## 2024-03-26 DIAGNOSIS — I130 Hypertensive heart and chronic kidney disease with heart failure and stage 1 through stage 4 chronic kidney disease, or unspecified chronic kidney disease: Secondary | ICD-10-CM | POA: Diagnosis not present

## 2024-03-26 DIAGNOSIS — N189 Chronic kidney disease, unspecified: Secondary | ICD-10-CM | POA: Diagnosis not present

## 2024-03-26 DIAGNOSIS — Z7901 Long term (current) use of anticoagulants: Secondary | ICD-10-CM | POA: Diagnosis not present

## 2024-03-26 DIAGNOSIS — K224 Dyskinesia of esophagus: Secondary | ICD-10-CM | POA: Diagnosis not present

## 2024-03-26 DIAGNOSIS — R1110 Vomiting, unspecified: Secondary | ICD-10-CM | POA: Diagnosis not present

## 2024-03-26 DIAGNOSIS — I2510 Atherosclerotic heart disease of native coronary artery without angina pectoris: Secondary | ICD-10-CM | POA: Insufficient documentation

## 2024-03-26 DIAGNOSIS — T07XXXA Unspecified multiple injuries, initial encounter: Secondary | ICD-10-CM

## 2024-03-26 DIAGNOSIS — Z950 Presence of cardiac pacemaker: Secondary | ICD-10-CM | POA: Diagnosis not present

## 2024-03-26 DIAGNOSIS — M25561 Pain in right knee: Secondary | ICD-10-CM | POA: Diagnosis not present

## 2024-03-26 DIAGNOSIS — I7090 Unspecified atherosclerosis: Secondary | ICD-10-CM | POA: Diagnosis not present

## 2024-03-26 DIAGNOSIS — I509 Heart failure, unspecified: Secondary | ICD-10-CM | POA: Diagnosis not present

## 2024-03-26 DIAGNOSIS — R748 Abnormal levels of other serum enzymes: Secondary | ICD-10-CM | POA: Diagnosis not present

## 2024-03-26 DIAGNOSIS — M1712 Unilateral primary osteoarthritis, left knee: Secondary | ICD-10-CM | POA: Diagnosis not present

## 2024-03-26 DIAGNOSIS — W01198A Fall on same level from slipping, tripping and stumbling with subsequent striking against other object, initial encounter: Secondary | ICD-10-CM | POA: Insufficient documentation

## 2024-03-26 DIAGNOSIS — S8991XA Unspecified injury of right lower leg, initial encounter: Secondary | ICD-10-CM | POA: Diagnosis present

## 2024-03-26 DIAGNOSIS — S80212A Abrasion, left knee, initial encounter: Secondary | ICD-10-CM | POA: Diagnosis not present

## 2024-03-26 DIAGNOSIS — M25562 Pain in left knee: Secondary | ICD-10-CM | POA: Diagnosis not present

## 2024-03-26 LAB — BASIC METABOLIC PANEL WITH GFR
BUN/Creatinine Ratio: 10 (ref 10–24)
BUN: 25 mg/dL (ref 8–27)
CO2: 24 mmol/L (ref 20–29)
Calcium: 9.8 mg/dL (ref 8.6–10.2)
Chloride: 98 mmol/L (ref 96–106)
Creatinine, Ser: 2.6 mg/dL — ABNORMAL HIGH (ref 0.76–1.27)
Glucose: 96 mg/dL (ref 70–99)
Potassium: 3.4 mmol/L — ABNORMAL LOW (ref 3.5–5.2)
Sodium: 142 mmol/L (ref 134–144)
eGFR: 26 mL/min/1.73 — ABNORMAL LOW (ref >59)

## 2024-03-26 NOTE — ED Notes (Signed)
 Pt's wife helped to take a sample down to lab, due to pt having multiple appointments and sample drop off today

## 2024-03-26 NOTE — Patient Outreach (Signed)
 Complex Care Management   Visit Note   Name:  Roberto [name] MRN: [id number] DOB: [birth year]  Situation: Referral received for Complex Care Management related to Heart Failure and Atrial Fibrillation I obtained verbal consent from Patient.  Visit completed with Roberto [name]'s caregiver [name].  Background:   Past Medical History:  Diagnosis Date   Aneurysm    Bicuspid aortic valve    a. s/p #27 Carbomedics mechanical valve on 03/25/2010; b. on Coumadin ; c. TTE 12/17: EF 40-45%, moderately dilated LV with moderate LVH, AVR well-seated with 14 mmHg gradient, peak AV velocity 2.5 m/s, mild mitral valve thickening with mild MR, mildly dilated RV with mildly reduced contraction   Cellulitis    CHF (congestive heart failure) (HCC)    Chronic kidney disease    Chronic systolic CHF (congestive heart failure) (HCC)    a. R/LHC 03/2010 showed no significant CAD, LVEDP 31 mmHg, mean AoV gradient 34 mmHg at rest and 47 mmHg with dobutamine 20 mcg/kg/min, AVA 1.0 cm^2, RA 31, RV 68/25, PA 68/47, PCWP 38. PA sat 65%. CO 6.2 L/min (Fick) and 5.3 L/min (thermodilution)   Clotting disorder    COPD (chronic obstructive pulmonary disease) (HCC)    H/O mechanical aortic valve replacement 03/25/2010   a. 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   Stage 3 chronic kidney disease (HCC)    Stroke (HCC)    TIA (transient ischemic attack) 05/2014    Assessment: Patient Reported Symptoms:  Cognitive Cognitive Status: Requires Assistance Decision Making, Struggling with memory recall (Outreach with patient's friend/caregiver) Cognitive/Intellectual Conditions Management [RPT]: Other Other: No documented problems, however caregiver notes changes in patient's memory recall and ability to make decisions. Health Maintenance Behaviors: Annual physical exam Healing Pattern:  Slow Health Facilitated by: Stress management, Rest  Neurological Neurological Review of Symptoms: Other: [place] Neurological Symptoms/Conditions [RPT]: Caregiver notes tremors and changes in balance Neurological Self-Management Outcome: 3 (uncertain)  HEENT HEENT Symptoms Reported: Other: (Patient classified as deaf. Caregiver denies acute changes.) HEENT Management Strategies: Coping strategies, Medical device, Routine screening HEENT Self-Management Outcome: 4 (good)  Cardiovascular Cardiovascular Symptoms Reported: No symptoms reported Does patient have uncontrolled Hypertension?: No  Respiratory Respiratory Symptoms Reported: No symptoms reported  Endocrine Endocrine Symptoms Reported: No symptoms reported Is patient diabetic?: Yes Is patient checking blood sugars at home?: No Endocrine Self-Management Outcome: 3 (uncertain)  Gastrointestinal Gastrointestinal Symptoms Reported: Unintentional weight loss Additional Gastrointestinal Details: Caregiver reports patient has loss approximately 20 lbs within the last few months. Reports decreased intake of solid foods, however he will tolerate shakes and soft foods. Pending barium swallow study on March 26, 2024 Gastrointestinal Management Strategies: Coping strategies, Diet modification Gastrointestinal Self-Management Outcome: 3 (uncertain) Gastrointestinal Comment: Pending barium swallow on March 26, 2024  Genitourinary Genitourinary Symptoms Reported: No symptoms reported  Integumentary Integumentary Symptoms Reported: No symptoms reported Skin Management Strategies: Routine screening  Musculoskeletal Musculoskelatal Symptoms Reviewed: Unsteady gait Additional Musculoskeletal Details: Caregiver reports patient uses a walker with ambulation. Musculoskeletal Self-Management Outcome: 3 (uncertain) Falls in the past year?: Yes Number of falls in past year: 2 or more Was there an injury with Fall?: No Fall Risk Category Calculator:  2 Patient Fall Risk Level: Moderate Fall Risk Patient at Risk for Falls Due to: Impaired balance/gait, Medication side effect, Impaired mobility, History of fall(s) Fall risk Follow up: Falls  prevention discussed  Psychosocial Psychosocial Symptoms Reported: Alteration in eating habits, Alteration in sleep habits Additional Psychological Details: Discussed with caregiver/[name] Behavioral Management Strategies: Coping strategies, Medication therapy, Support system Behavioral Health Self-Management Outcome: 3 (uncertain) Behavioral Health Comment: Caregiver reports patient is pending outreach with the [place] provider on March 26, 2024. Behaviors When Feeling Stressed/Fearful: Outreach with caregiver/[name]. Reports decline in patient's ability to make decisions. Also reports decline in memory recall. Techniques to Cope with Loss/Stress/Change: Medication Quality of Family Relationships: supportive, involved, helpful Do you feel physically threatened by others?: No     There were no vitals filed for this visit.  Medications Reviewed Today     Reviewed by Karoline Lima, RN (Registered Nurse) on 03/25/24 at 1144  Med List Status: <None>   Medication Order Taking? Sig Documenting Provider Last Dose Status Informant  albuterol  (PROVENTIL ) (2.5 MG/3ML) 0.083% nebulizer solution [id number]  USE 1 VIAL IN NEBULIZER EVERY 6 HOURS AS NEEDED FOR WHEEZING FOR SHORTNESS OF BREATH [name], [name], DO  Active Family Member, Pharmacy Records  albuterol  (PROVENTIL ) (2.5 MG/3ML) 0.083% nebulizer solution 2.5 mg [id number]   Vicci Bouchard P, DO  Active   allopurinol  (ZYLOPRIM ) 100 MG tablet [id number]  Take 1 tablet (100 mg total) by mouth daily. [name], MD  Active   amiodarone  (PACERONE ) 200 MG tablet [id number]  Take 2 tablets by mouth twice daily End, [name], MD  Active   baclofen  (LIORESAL ) 10 MG tablet [id number]  Take 0.5-1 tablets (5-10 mg total) by mouth at bedtime as needed. [name], Megan  P, DO  Active   buPROPion  (WELLBUTRIN  XL) 300 MG 24 hr tablet [id number]  Take 300 mg by mouth daily. [provider]  Active Family Member, Pharmacy Records           Med Note LAVADA, PAULINA MARLA Schaumann Sep 27, 2023 10:02 AM)    dapagliflozin  propanediol (FARXIGA ) 10 MG TABS tablet [id number]  Take 1 tablet (10 mg total) by mouth daily before breakfast. End, [name], MD  Active   fenofibrate  (TRICOR ) 48 MG tablet [id number]  Take 48 mg by mouth daily. [provider]  Active Family Member, Pharmacy Records  Fluticasone -Umeclidin-Vilant (TRELEGY ELLIPTA ) 200-62.5-25 MCG/ACT AEPB [id number]  Inhale 1 puff into the lungs daily in the afternoon. [name], MD  Active Family Member, Pharmacy Records  losartan  (COZAAR ) 25 MG tablet [id number]  Take 25 mg by mouth daily. [provider]  Active   metFORMIN  (GLUCOPHAGE -XR) 500 MG 24 hr tablet [id number]  Take 1 tablet by mouth once daily with breakfast Vicci, [name], DO  Active   metoprolol  succinate (TOPROL -XL) 50 MG 24 hr tablet [id number]  Take 1 tablet (50 mg total) by mouth daily. Rolan Ezra RAMAN, MD  Active   mexiletine (MEXITIL ) 150 MG capsule [id number]  Take 150 mg by mouth 3 (three) times daily. [provider]  Active Family Member, Pharmacy Records  midodrine  (PROAMATINE ) 2.5 MG tablet [id number]  Take 1 tablet (2.5 mg total) by mouth 2 (two) times daily with a meal. Vicci Bouchard SQUIBB, DO  Active Family Member, Pharmacy Records  nortriptyline  (PAMELOR ) 10 MG capsule [id number]  Take 1 capsule by mouth at bedtime Vicci Bouchard SQUIBB, DO  Active Family Member, Pharmacy Records  Omega-3 Fatty Acids (OMEGA 3 PO) [id number]  Take 1,500 mg by mouth 2 (two) times daily. [provider]  Active Family Member, Pharmacy Records  pantoprazole  (PROTONIX ) 40 MG tablet [id number]  Take 1 tablet (40 mg total)  by mouth daily. Suzann Inocente HERO, MD  Active   rosuvastatin  (CRESTOR ) 40 MG tablet [id number]  Take 1 tablet (40  mg total) by mouth daily. Mady Bruckner, MD  Active Family Member, Pharmacy Records  Spacer/Aero-Holding Farson DEVI [id number]  Use as directed Dx: COPD, J44.9 [name], DO  Active Family Member, Pharmacy Records  torsemide  (DEMADEX ) 20 MG tablet [id number]  Take 1 tablet (20 mg total) by mouth daily. Rolan Ezra RAMAN, MD  Active   traZODone  (DESYREL ) 50 MG tablet [id number]  Take 0.5 tablets (25 mg total) by mouth at bedtime as needed for sleep. [name], DO  Active   VENTOLIN  HFA 108 (90 Base) MCG/ACT inhaler [id number]  INHALE 2 PUFFS BY MOUTH EVERY 6 HOURS AS NEEDED FOR WHEEZING OR SHORTNESS OF BREATH [name], [name], DO  Active Family Member, Pharmacy Records  warfarin (COUMADIN ) 1 MG tablet [id number]  Take 1 tablet (1 mg total) by mouth daily.  Patient taking differently: Take 2 mg by mouth daily.   [name], DO  Active Family Member, Pharmacy Records  Med List Note (Hagopian, Tyelisha L, CPhT 08/27/23 1743): DO NOT REFILL MEDICATIONS WITHOUT AN APPOINTMENT / Friend Ms. [name] assists with his medication            Recommendation:   Continue Current Plan of Care Complete Barium Swallow as scheduled on March 26, 2024 [facility] as scheduled on September 2025  Follow Up Plan:   Telephone follow up appointment with Nurse Case Manager on April 14, 2024   Jackson Acron [facility] RN Care Manager Direct Dial: [phone]  Fax: [phone] Website: [email or web address]

## 2024-03-26 NOTE — ED Notes (Signed)
 Pt offered a warm blanket, pt declined, blanket left with pt in case he needs it in the future, wife at bedside now

## 2024-03-26 NOTE — ED Notes (Signed)
 Wounds cleansed with sterile saline and gauze, bilat knees covered with mepilex bandages, bilat ankle covered with a large band aid, right upper arm covered with  a non stick with kerlix over and left upper arm covered with a large band aid, instructions given for cleaning and care and s/s of infection, pt and wife both verbalized understanding

## 2024-03-26 NOTE — Patient Instructions (Addendum)
 Thank you for allowing the Complex Care Management team to participate in your care.   We will follow up on April 14, 2024 at 1100. Please do not hesitate to contact me if you require assistance prior to our next outreach.    Jackson Acron [facility] RN Care Manager Direct Dial: [phone]  Fax: [phone] Website: [email or web address]

## 2024-03-26 NOTE — Progress Notes (Signed)
Remote ICD Transmission.

## 2024-03-26 NOTE — Discharge Instructions (Addendum)
 Follow-up with your primary care provider if any continued problems or concerns.  You will have a lot of soreness and stiffness from your fall and other parts of your body as well as in both your knees.  Watch the abrasions for any signs of infection.  Clean daily with mild soap and water  and allowed to dry completely before covering it.  Follow-up with your primary care provider or urgent care if any signs of infection or concerns.  X-rays were negative for fracture.

## 2024-03-26 NOTE — Telephone Encounter (Signed)
 reordered 02/29/24 #90 tablet

## 2024-03-26 NOTE — ED Triage Notes (Signed)
 Pt arrives via ems from home. Pt was outside using his walker and thinks he may have miss stepped and fell, pt has bilat abrasions to his knees, bilat ankle and right elbow, denies hitting his head, denies loc, started lovenox  injections last pm due to his coumadin  not working

## 2024-03-26 NOTE — ED Provider Notes (Signed)
 [facility] Provider Note    Event Date/Time   First MD Initiated Contact with Patient 03/26/24 [phone]     (approximate)   History   Fall   HPI  Roberto [name] is a 68 y.o. male   presents to the ED via EMS after a fall that occurred at his home.  Patient states that he was using his walker to get into a car for a doctor's appointments morning.  Patient states that he tripped on the concrete landing on his knees.  This was a witnessed fall and he denies any head injury or loss of consciousness.  He does have scrapes to both knees and a skin tear to his right upper extremity, his last tetanus was in the last 10 years.  Patient denies any other injuries.  Patient has a history of hypertension, CHF, CKD, COPD, CVA, atrial fibs, VT, PTSD, mechanical aortic valve replacement, nonischemic cardiomyopathy, diabetes type 2, coronary artery disease, depression, pacemaker, essential tremor and recent change from Coumadin  to Lovenox  with his first injection last night.      Physical Exam   Triage Vital Signs: ED Triage Vitals  Encounter Vitals Group     BP      Girls Systolic BP Percentile      Girls Diastolic BP Percentile      Boys Systolic BP Percentile      Boys Diastolic BP Percentile      Pulse      Resp      Temp      Temp src      SpO2      Weight      Height      Head Circumference      Peak Flow      Pain Score      Pain Loc      Pain Education      Exclude from Growth Chart     Most recent vital signs: Vitals:   03/26/24 0833 03/26/24 1003  BP: 92/74 (!) 80/65  Pulse: 80 81  Resp: 16   Temp: 98.9 F (37.2 C)   SpO2: 92% 96%     General: Awake, no distress.  Alert, talkative, cooperative, answers questions appropriately. CV:  Good peripheral perfusion.  Heart regular rate and rhythm. Resp:  Normal effort.  Lungs clear bilaterally. Abd:  No distention.  Soft, nontender, bowel sounds normoactive x 4 quadrants. Other:  Patient able to  move upper extremities without any difficulty.  No tenderness on palpation of cervical spine.  No tenderness or pain with compression of the hips.  Patient is able to move lower extremities without any difficulty.  There appears to be some abrasions anterior knees bilaterally.  No active bleeding at present.  No foreign bodies are present.  No point tenderness on palpation of the femurs bilaterally.  No soft tissue edema or effusion noted to the knees bilaterally.  Patient does have some soft tissue superficial abrasions to his right upper extremity from his walker.   ED Results / Procedures / Treatments   Labs (all labs ordered are listed, but only abnormal results are displayed) Labs Reviewed - No data to display  RADIOLOGY X-ray images of the right and left knees were reviewed and interpreted by myself independent of the radiologist and was negative for fracture or dislocation.  Official radiology report is negative.    PROCEDURES:  Critical Care performed:   Procedures   MEDICATIONS ORDERED IN ED: Medications -  No data to display   IMPRESSION / MDM / ASSESSMENT AND PLAN / ED COURSE  I reviewed the triage vital signs and the nursing notes.   Differential diagnosis includes, but is not limited to, multiple abrasions, fracture, contusion, dislocation bilateral or unilateral lower extremities.  68 year old male is brought to the ED via EMS after a fall that occurred at his home while getting into a car.  His wife was present and this was a witnessed fall.  Patient reports that he is not having any pain other than his knees which hit concrete.  X-rays were reassuring and patient was made aware.  Patient is currently on Lovenox  with his first injection being last night.  Wife confirms that there was no head injury or loss of consciousness.  Patient was given instructions to keep the abrasions clean and dry.  Clean daily with mild soap and water  and watch for any signs of infection.  He  has to follow-up with his PCP or return to the emergency department if any urgent concerns or worsening of his symptoms.      Patient's presentation is most consistent with acute complicated illness / injury requiring diagnostic workup.  FINAL CLINICAL IMPRESSION(S) / ED DIAGNOSES   Final diagnoses:  Contusion of right knee, initial encounter  Contusion of left knee, initial encounter  Abrasions of multiple sites  Fall, initial encounter     Rx / DC Orders   ED Discharge Orders     None        Note:  This document was prepared using Dragon voice recognition software and may include unintentional dictation errors.   Saunders Shona CROME, [name] 03/26/24 1430    Arlander Charleston, MD 03/26/24 1430

## 2024-03-27 ENCOUNTER — Ambulatory Visit: Payer: Self-pay | Admitting: Pediatrics

## 2024-03-27 ENCOUNTER — Other Ambulatory Visit: Payer: Self-pay

## 2024-03-27 ENCOUNTER — Ambulatory Visit: Payer: Self-pay | Admitting: Family Medicine

## 2024-03-27 ENCOUNTER — Encounter: Payer: Self-pay | Admitting: Pediatrics

## 2024-03-27 ENCOUNTER — Telehealth: Payer: Self-pay | Admitting: Family Medicine

## 2024-03-27 ENCOUNTER — Telehealth: Payer: Self-pay

## 2024-03-27 LAB — CUP PACEART REMOTE DEVICE CHECK
Battery Remaining Longevity: 72 mo
Battery Remaining Percentage: 91 %
Battery Voltage: 2.99 V
Date Time Interrogation Session: 20250924143445
HighPow Impedance: 84 Ohm
Lead Channel Impedance Value: 460 Ohm
Lead Channel Impedance Value: 480 Ohm
Lead Channel Pacing Threshold Amplitude: 0.75 V
Lead Channel Pacing Threshold Amplitude: 1.875 V
Lead Channel Pacing Threshold Pulse Width: 0.5 ms
Lead Channel Pacing Threshold Pulse Width: 1 ms
Lead Channel Sensing Intrinsic Amplitude: 11.6 mV
Lead Channel Setting Pacing Amplitude: 1.75 V
Lead Channel Setting Pacing Amplitude: 2.375
Lead Channel Setting Pacing Pulse Width: 0.5 ms
Lead Channel Setting Pacing Pulse Width: 1 ms
Lead Channel Setting Sensing Sensitivity: 0.5 mV
Pulse Gen Serial Number: 111073979

## 2024-03-27 LAB — PROTIME-INR: INR: 5.8 — AB (ref 0.80–1.20)

## 2024-03-27 NOTE — Telephone Encounter (Signed)
 Can we please write up a DME order for the following

## 2024-03-27 NOTE — Telephone Encounter (Signed)
-----   Message from Nurse Jackson HERO sent at 03/27/2024  3:23 PM EDT ----- Regarding: Patient Fall: Request for DME and Supplies Good Afternoon Dr. Vicci,  I spoke with Mr. Parkerson's caregiver [name]. Unfortunately, he experienced a fall outside of his home yesterday while preparing to leave for a medical appointment. He was evaluated and treated at [facility] following the fall. His caregiver reports changing the dressings as advised by the ER staff. She will need additional supplies. She is requesting a written script for the following:  -DME: Bedside commode -Mepilex foam absorbent dressing -Gauze -Kerlix gauze wrap  He was scheduled for a follow-up clinic visit and INR check with you on April 01, 2024. [name] requested to check his INR with his home Coag-Sense device and reschedule for a telephone/virtual visit due to his unsteady gait. She verbalized awareness of indications for calling 911 and seeking immediate medical attention.   The scheduling team arranged a virtual visit with you on April 04, 2024.  Thank you!

## 2024-03-28 ENCOUNTER — Ambulatory Visit: Admitting: Internal Medicine

## 2024-03-28 ENCOUNTER — Encounter: Payer: Self-pay | Admitting: Internal Medicine

## 2024-03-28 ENCOUNTER — Ambulatory Visit: Payer: Self-pay

## 2024-03-28 ENCOUNTER — Ambulatory Visit (INDEPENDENT_AMBULATORY_CARE_PROVIDER_SITE_OTHER): Admitting: Pulmonary Disease

## 2024-03-28 ENCOUNTER — Encounter: Payer: Self-pay | Admitting: Pulmonary Disease

## 2024-03-28 ENCOUNTER — Ambulatory Visit: Admitting: Pulmonary Disease

## 2024-03-28 VITALS — BP 110/80 | HR 76 | Temp 98.0°F | Ht 66.0 in | Wt 236.0 lb

## 2024-03-28 VITALS — BP 128/74 | Ht 66.0 in | Wt 236.0 lb

## 2024-03-28 DIAGNOSIS — K224 Dyskinesia of esophagus: Secondary | ICD-10-CM

## 2024-03-28 DIAGNOSIS — F17210 Nicotine dependence, cigarettes, uncomplicated: Secondary | ICD-10-CM

## 2024-03-28 DIAGNOSIS — J439 Emphysema, unspecified: Secondary | ICD-10-CM

## 2024-03-28 DIAGNOSIS — L089 Local infection of the skin and subcutaneous tissue, unspecified: Secondary | ICD-10-CM | POA: Diagnosis not present

## 2024-03-28 DIAGNOSIS — I428 Other cardiomyopathies: Secondary | ICD-10-CM

## 2024-03-28 DIAGNOSIS — T07XXXA Unspecified multiple injuries, initial encounter: Secondary | ICD-10-CM

## 2024-03-28 DIAGNOSIS — L03113 Cellulitis of right upper limb: Secondary | ICD-10-CM

## 2024-03-28 DIAGNOSIS — R0602 Shortness of breath: Secondary | ICD-10-CM | POA: Diagnosis not present

## 2024-03-28 DIAGNOSIS — Z95810 Presence of automatic (implantable) cardiac defibrillator: Secondary | ICD-10-CM

## 2024-03-28 DIAGNOSIS — J4489 Other specified chronic obstructive pulmonary disease: Secondary | ICD-10-CM

## 2024-03-28 DIAGNOSIS — G4733 Obstructive sleep apnea (adult) (pediatric): Secondary | ICD-10-CM | POA: Diagnosis not present

## 2024-03-28 LAB — PULMONARY FUNCTION TEST
DL/VA % pred: 78 %
DL/VA: 3.21 ml/min/mmHg/L
DLCO cor % pred: 73 %
DLCO cor: 16.78 ml/min/mmHg
DLCO unc % pred: 74 %
DLCO unc: 16.87 ml/min/mmHg
FEF 25-75 Post: 2.91 L/s
FEF 25-75 Pre: 2.6 L/s
FEF2575-%Change-Post: 11 %
FEF2575-%Pred-Post: 134 %
FEF2575-%Pred-Pre: 120 %
FEV1-%Change-Post: 2 %
FEV1-%Pred-Post: 95 %
FEV1-%Pred-Pre: 93 %
FEV1-Post: 2.68 L
FEV1-Pre: 2.61 L
FEV1FVC-%Change-Post: 3 %
FEV1FVC-%Pred-Pre: 108 %
FEV6-%Change-Post: 0 %
FEV6-%Pred-Post: 90 %
FEV6-%Pred-Pre: 91 %
FEV6-Post: 3.25 L
FEV6-Pre: 3.26 L
FEV6FVC-%Change-Post: 0 %
FEV6FVC-%Pred-Post: 106 %
FEV6FVC-%Pred-Pre: 106 %
FVC-%Change-Post: 0 %
FVC-%Pred-Post: 85 %
FVC-%Pred-Pre: 85 %
FVC-Post: 3.25 L
FVC-Pre: 3.28 L
Post FEV1/FVC ratio: 82 %
Post FEV6/FVC ratio: 100 %
Pre FEV1/FVC ratio: 80 %
Pre FEV6/FVC Ratio: 100 %

## 2024-03-28 LAB — HEPATITIS A ANTIBODY, TOTAL: Hepatitis A AB,Total: NONREACTIVE

## 2024-03-28 LAB — ANTI-SMOOTH MUSCLE ANTIBODY, IGG: Actin (Smooth Muscle) Antibody (IGG): <20 U (ref <20)

## 2024-03-28 LAB — ALPHA-1 ANTITRYPSIN PHENOTYPE: A-1 Antitrypsin, Ser: 162 mg/dL (ref 83–199)

## 2024-03-28 LAB — TISSUE TRANSGLUTAMINASE ABS,IGG,IGA
(tTG) Ab, IgA: <1 U/mL
(tTG) Ab, IgG: <1 U/mL

## 2024-03-28 LAB — HEPATITIS B SURFACE ANTIGEN: Hepatitis B Surface Ag: NONREACTIVE

## 2024-03-28 LAB — ANTI-NUCLEAR AB-TITER (ANA TITER)
ANA TITER: 1:40 {titer} — ABNORMAL HIGH
ANA Titer 1: 1:320 {titer} — ABNORMAL HIGH

## 2024-03-28 LAB — IGG: IgG (Immunoglobin G), Serum: 1261 mg/dL (ref 600–1540)

## 2024-03-28 LAB — ANA: Anti Nuclear Antibody (ANA): POSITIVE — AB

## 2024-03-28 LAB — HEPATITIS B SURFACE ANTIBODY,QUALITATIVE: Hep B S Ab: NONREACTIVE

## 2024-03-28 LAB — IGA: Immunoglobulin A: 170 mg/dL (ref 70–320)

## 2024-03-28 LAB — HIV ANTIBODY (ROUTINE TESTING W REFLEX)
HIV 1&2 Ab, 4th Generation: NONREACTIVE
HIV FINAL INTERPRETATION: NEGATIVE

## 2024-03-28 LAB — HEPATITIS B CORE ANTIBODY, TOTAL: Hep B Core Total Ab: NONREACTIVE

## 2024-03-28 MED ORDER — AMOXICILLIN-POT CLAVULANATE 875-125 MG PO TABS: 1.0000 | ORAL_TABLET | Freq: Two times a day (BID) | ORAL | 0 refills | Status: AC

## 2024-03-28 NOTE — Patient Instructions (Signed)
 VISIT SUMMARY:  Today, we discussed your chronic obstructive pulmonary disease (COPD) and sleep apnea. We reviewed your current medications and addressed concerns about your breathing and sleep issues.  YOUR PLAN:  -CHRONIC OBSTRUCTIVE PULMONARY DISEASE (COPD): COPD is a lung condition that makes it hard to breathe and is often caused by smoking. You should continue using Trelegy daily and albuterol  as needed. It is important to stop smoking to prevent further damage to your lungs.  - SLEEP APNEA, SEVERE: You have severe sleep apnea.  We will make sure that you get your CPAP equipment and then have you follow-up with Dr. Jess our sleep specialist with regards to your sleep apnea.  I will continue all of your other lung issues.  -TOBACCO USE: Smoking is worsening your lung function and COPD symptoms. Quitting smoking is crucial to improve your health and prevent further decline in lung function.  INSTRUCTIONS:  Please continue using your current medications as prescribed. It is very important to stop smoking to help improve your lung function. Follow up with your sleep specialist regarding the new equipment for your sleep apnea. If you have any questions or concerns, please contact our office.

## 2024-03-28 NOTE — Telephone Encounter (Signed)
 [employer] health order completed for the commode. Insurance does not cover wound care supplies unless patient being seen by wound care providers. Will advise [name].

## 2024-03-28 NOTE — Progress Notes (Signed)
 Date:  03/28/2024   Name:  Roberto [name]   DOB:  07/19/1955   MRN:  [id number]   Chief Complaint: Hand Pain (Post fall on 03/27/2024. Patient is concerned he now has an infection in his hand because it is bright red in color.)  Hand Pain  The incident occurred 2 days ago. The incident occurred at home. The injury mechanism was a fall. The pain is present in the right hand. Pertinent negatives include no chest pain.  He fell at home yesterday AM.  He hurt his knees and sustained abrasions on both elbows, both knees, left hand and both lateral ankles. ED evaluation consisted of knee imaging - ruled out fractures. The right hand was swollen and red this AM and he comes in to be evaluated for that. Currently cleaning the wounds with warm water , applying TAO and non stick dressing.  Review of Systems  Constitutional:  Positive for appetite change and unexpected weight change. Negative for diaphoresis and fever.  Respiratory:  Negative for chest tightness and shortness of breath.   Cardiovascular:  Negative for chest pain.  Gastrointestinal:  Negative for abdominal pain, diarrhea and nausea.  Skin:  Positive for color change and wound.  Neurological:  Positive for tremors.     Lab Results  Component Value Date   NA 142 03/25/2024   K 3.4 (L) 03/25/2024   CO2 24 03/25/2024   GLUCOSE 96 03/25/2024   BUN 25 03/25/2024   CREATININE 2.60 (H) 03/25/2024   CALCIUM  9.8 03/25/2024   EGFR 26 (L) 03/25/2024   GFRNONAA 33 (L) 03/19/2024   Lab Results  Component Value Date   CHOL 145 09/11/2023   HDL 69 09/11/2023   LDLCALC 47 09/11/2023   LDLDIRECT 96.4 06/04/2019   TRIG 177 (H) 09/11/2023   CHOLHDL 4.3 08/08/2019   Lab Results  Component Value Date   TSH 3.59 03/24/2024   Lab Results  Component Value Date   HGBA1C 5.8 (H) 03/04/2024   Lab Results  Component Value Date   WBC 11.7 (H) 03/19/2024   HGB 13.9 03/21/2024   HGB 13.9 03/21/2024   HCT 41.0 03/21/2024   HCT 41.0  03/21/2024   MCV 88.5 03/19/2024   PLT 341 03/19/2024   Lab Results  Component Value Date   ALT 44 03/17/2024   AST 37 03/17/2024   ALKPHOS 114 03/17/2024   BILITOT 0.5 03/17/2024   No results found for: MARIEN BOLLS, VD25OH   Patient Active Problem List   Diagnosis Date Noted   Esophageal dysmotility 03/28/2024   Indigestion 03/24/2024   Unintentional weight loss 03/07/2024   Presence of automatic (implantable) cardiac defibrillator 03/07/2024   Presence of heart assist device (HCC) 02/15/2024   Polyp of colon 12/13/2023   PTSD (post-traumatic stress disorder) 11/15/2023   DM type 2 with diabetic peripheral neuropathy (HCC) 10/23/2023   Essential tremor 10/23/2023   Positive occult stool blood test 10/10/2023   Pacemaker 09/11/2023   Persistent atrial fibrillation (HCC) 07/12/2023   Visit for wound check 07/10/2023   Acute on chronic systolic CHF (congestive heart failure) (HCC) 06/22/2023   VT (ventricular tachycardia) (HCC) 06/18/2023   Hypotension 06/18/2023   Atrial fibrillation (HCC) 06/17/2023   Depression 06/17/2023   Tremor of left hand 05/29/2022   Aneurysm of ascending aorta without rupture 02/17/2022   Anticoagulated on Coumadin  07/29/2021   Chronic kidney disease, stage 3a (HCC) 12/09/2020   Hyperlipidemia associated with type 2 diabetes mellitus (HCC) 12/09/2020   Coronary artery  disease involving native coronary artery of native heart without angina pectoris 09/10/2019   Osteoarthritis of spine with radiculopathy, lumbar region 11/21/2018   History of aortic stenosis 06/05/2018   Chronic gout without tophus 04/03/2018   Valvular heart disease 01/17/2018   Morbid obesity (HCC) 01/17/2018   Chronic HFrEF (heart failure with reduced ejection fraction) (HCC) 09/27/2016   Renal infarct 05/01/2016   Type 2 diabetes mellitus with cardiac complication (HCC) 04/19/2016   Chronic bilateral low back pain without sciatica 04/19/2016   Thoracic aortic  aneurysm without rupture 03/16/2016   Aortic atherosclerosis 03/16/2016   Nicotine  dependence, cigarettes, uncomplicated 03/14/2016   NICM (nonischemic cardiomyopathy) (HCC) 03/02/2016   COPD (chronic obstructive pulmonary disease) (HCC) 09/09/2015   H/O mechanical aortic valve replacement 02/09/2015   Hypertension associated with diabetes (HCC) 02/09/2015    Allergies  Allergen Reactions   Nicotine  Hives and Rash    Patches caused localized rash and hives     Past Surgical History:  Procedure Laterality Date   AORTIC VALVE REPLACEMENT     AORTIC VALVE SURGERY     CARDIAC CATHETERIZATION  03/21/2010   No significant CAD. Severe aortic stenosis. Severely elevated left and right heart filling pressures.   CARDIAC SURGERY  2009   CHF   CARDIOVERSION N/A 08/28/2023   Procedure: CARDIOVERSION;  Surgeon: [name] Slain, MD;  Location: [facility] INVASIVE CV LAB;  Service: Cardiovascular;  Laterality: N/A;   CARPAL TUNNEL RELEASE Left 2005   COLONOSCOPY N/A 12/13/2023   Procedure: COLONOSCOPY;  Surgeon: Suzann Inocente HERO, MD;  Location: [facility];  Service: Gastroenterology;  Laterality: N/A;   ICD IMPLANT N/A 06/25/2023   Procedure: ICD IMPLANT;  Surgeon: Waddell Danelle ORN, MD;  Location: [facility] INVASIVE CV LAB;  Service: Cardiovascular;  Laterality: N/A;   PACEMAKER IMPLANT     POLYPECTOMY  12/13/2023   Procedure: POLYPECTOMY, INTESTINE;  Surgeon: Suzann Inocente HERO, MD;  Location: [facility];  Service: Gastroenterology;;   RIGHT HEART CATH Right 03/21/2024   Procedure: RIGHT HEART CATH;  Surgeon: Rolan Ezra RAMAN, MD;  Location: [facility] INVASIVE CV LAB;  Service: Cardiovascular;  Laterality: Right;   RIGHT HEART CATH AND CORONARY ANGIOGRAPHY N/A 01/28/2019   Procedure: RIGHT HEART CATH AND CORONARY ANGIOGRAPHY;  Surgeon: [name], MD;  Location: [facility] INVASIVE CV LAB;  Service: Cardiovascular;  Laterality: N/A;   RIGHT HEART CATH AND CORONARY ANGIOGRAPHY N/A 06/20/2023   Procedure: RIGHT  HEART CATH AND CORONARY ANGIOGRAPHY;  Surgeon: [name], MD;  Location: [facility] INVASIVE CV LAB;  Service: Cardiovascular;  Laterality: N/A;   TONSILLECTOMY  1962    Social History   Tobacco Use   Smoking status: Every Day    Current packs/day: 0.20    Average packs/day: 0.5 packs/day for 48.2 years (23.7 ttl pk-yrs)    Types: Cigarettes    Start date: 38    Last attempt to quit: 2021   Smokeless tobacco: Never   Tobacco comments:    3 or less cigarettes per day  Vaping Use   Vaping status: Never Used  Substance Use Topics   Alcohol use: Never   Drug use: Never     Medication list has been reviewed and updated.  Current Meds  Medication Sig   albuterol  (PROVENTIL ) (2.5 MG/3ML) 0.083% nebulizer solution USE 1 VIAL IN NEBULIZER EVERY 6 HOURS AS NEEDED FOR WHEEZING FOR SHORTNESS OF BREATH   allopurinol  (ZYLOPRIM ) 100 MG tablet Take 1 tablet (100 mg total) by mouth daily.   amiodarone  (PACERONE ) 200 MG  tablet Take 2 tablets by mouth twice daily   amoxicillin -clavulanate (AUGMENTIN ) 875-125 MG tablet Take 1 tablet by mouth 2 (two) times daily for 10 days.   baclofen  (LIORESAL ) 10 MG tablet Take 0.5-1 tablets (5-10 mg total) by mouth at bedtime as needed.   buPROPion  (WELLBUTRIN  XL) 300 MG 24 hr tablet Take 300 mg by mouth daily.   dapagliflozin  propanediol (FARXIGA ) 10 MG TABS tablet Take 1 tablet (10 mg total) by mouth daily before breakfast.   enoxaparin  (LOVENOX ) 120 MG/0.8ML injection INJECT 0.72 ML (108MG ) INTO THE SKIN EVERY 12 HOURS   fenofibrate  (TRICOR ) 48 MG tablet Take 48 mg by mouth daily.   Fluticasone -Umeclidin-Vilant (TRELEGY ELLIPTA ) 200-62.5-25 MCG/ACT AEPB Inhale 1 puff into the lungs daily in the afternoon.   losartan  (COZAAR ) 25 MG tablet Take 25 mg by mouth daily.   metFORMIN  (GLUCOPHAGE -XR) 500 MG 24 hr tablet Take 1 tablet by mouth once daily with breakfast   metoprolol  succinate (TOPROL -XL) 50 MG 24 hr tablet Take 1 tablet (50 mg total) by mouth  daily.   mexiletine (MEXITIL ) 150 MG capsule Take 150 mg by mouth 3 (three) times daily.   midodrine  (PROAMATINE ) 2.5 MG tablet Take 1 tablet (2.5 mg total) by mouth 2 (two) times daily with a meal.   nortriptyline  (PAMELOR ) 10 MG capsule Take 1 capsule by mouth at bedtime   Omega-3 Fatty Acids (OMEGA 3 PO) Take 1,500 mg by mouth 2 (two) times daily.   pantoprazole  (PROTONIX ) 40 MG tablet Take 1 tablet (40 mg total) by mouth daily.   rosuvastatin  (CRESTOR ) 40 MG tablet Take 1 tablet (40 mg total) by mouth daily.   Spacer/Aero-Holding Raguel FRENCH Use as directed Dx: COPD, J44.9   torsemide  (DEMADEX ) 20 MG tablet Take 1 tablet (20 mg total) by mouth daily.   traZODone  (DESYREL ) 50 MG tablet Take 0.5 tablets (25 mg total) by mouth at bedtime as needed for sleep.   VENTOLIN  HFA 108 (90 Base) MCG/ACT inhaler INHALE 2 PUFFS BY MOUTH EVERY 6 HOURS AS NEEDED FOR WHEEZING OR SHORTNESS OF BREATH   warfarin (COUMADIN ) 1 MG tablet Take 1 tablet (1 mg total) by mouth daily. (Patient taking differently: Take 2 mg by mouth daily.)   Current Facility-Administered Medications for the 03/28/24 encounter (Office Visit) with Justus Leita DEL, MD  Medication   albuterol  (PROVENTIL ) (2.5 MG/3ML) 0.083% nebulizer solution 2.5 mg       03/28/2024    1:11 PM 03/17/2024   10:10 AM 02/29/2024    8:41 AM 01/03/2024    8:30 AM  GAD 7 : Generalized Anxiety Score  Nervous, Anxious, on Edge 3 3 3 1   Control/stop worrying 3 3 3 3   Worry too much - different things 3 3 3 3   Trouble relaxing 3 3 3 1   Restless 3 3 3 1   Easily annoyed or irritable 3 3 3 3   Afraid - awful might happen 3 3 3 3   Total GAD 7 Score 21 21 21 15   Anxiety Difficulty Extremely difficult Extremely difficult Extremely difficult Extremely difficult       03/28/2024    1:10 PM 03/17/2024   10:09 AM 02/29/2024    8:40 AM  Depression screen PHQ 2/9  Decreased Interest 3 3 3   Down, Depressed, Hopeless 3 3 3   PHQ - 2 Score 6 6 6   Altered sleeping  3 3 3   Tired, decreased energy 3 3 3   Change in appetite 3 3 3   Feeling bad or failure about  yourself  3 3 3   Trouble concentrating 3 3 3   Moving slowly or fidgety/restless 3 3 3   Suicidal thoughts 3 3 2   PHQ-9 Score 27 27 26   Difficult doing work/chores Extremely dIfficult Extremely dIfficult Extremely dIfficult    BP Readings from Last 3 Encounters:  03/28/24 128/74  03/28/24 110/80  03/26/24 (!) 80/65    Physical Exam Vitals and nursing note reviewed.  Constitutional:      General: He is not in acute distress.    Appearance: He is well-developed. He is obese.  HENT:     Head: Normocephalic and atraumatic.  Cardiovascular:     Rate and Rhythm: Normal rate and regular rhythm.  Pulmonary:     Effort: Pulmonary effort is normal. No respiratory distress.     Breath sounds: No wheezing.  Skin:    General: Skin is warm and dry.     Findings: Abrasion and lesion present.         Comments: Blue area = red warm slightly swollen with no open wounds  Red areas - superficial abrasions with some mild exudate and dried blood, minimal oozing of blood.  Neurological:     Mental Status: He is alert and oriented to person, place, and time.  Psychiatric:        Mood and Affect: Mood normal.        Behavior: Behavior normal.     Wt Readings from Last 3 Encounters:  03/28/24 236 lb (107 kg)  03/28/24 236 lb (107 kg)  03/28/24 236 lb (107 kg)    BP 128/74   Ht 5' 6 (1.676 m)   Wt 236 lb (107 kg)   BMI 38.09 kg/m   Assessment and Plan:  Problem List Items Addressed This Visit       Unprioritized   Esophageal dysmotility (Chronic)   Barium swallow showed moderate dysmotility Likely the cause of his eating disorder Encourage him to take the Pantoprazole  daily, eat soft foods, chew thoroughly and eat slowly.      Other Visit Diagnoses       Cellulitis of right upper extremity    -  Primary   due to recent abrasion on the elbow continue wound care Augmentin  to cover  for bacterial infection   Relevant Medications   amoxicillin -clavulanate (AUGMENTIN ) 875-125 MG tablet     Abrasions of multiple sites with infection       Continue home wound care  clean with warm water  daily; apply TAO and cover with telfa/kerlix       Return if symptoms worsen or fail to improve.    Leita HILARIO Adie, MD [facility] [place]

## 2024-03-28 NOTE — Progress Notes (Signed)
 Subjective:    Patient ID: Roberto [name], male    DOB: 14-Jan-1956, 68 y.o.   MRN: [id number]  Patient Care Team: [name], DO as PCP - General (Family Medicine) End, [name], MD as PCP - Cardiology (Cardiology) Shlomo Wilbert SAUNDERS, MD as PCP - Sleep Medicine (Cardiology) Waddell Danelle ORN, MD as PCP - Electrophysiology (Cardiology) Marcelino Gales, MD (Nephrology) McGreal, Inocente HERO, MD as Consulting Physician (Gastroenterology) Lucas Dorise POUR, MD as Consulting Physician (Cardiothoracic Surgery) Willma Camelia CROME, RN (Inactive) as [facility] [place], LCSW as VBCI Care Management Karoline Lima, RN as VBCI Care Management Karoline Lima, RN as Case Manager  Chief Complaint  Patient presents with   COPD    Shortness of breath on exertion. Occasional cough.    BACKGROUND/INTERVAL:Patient is a current smoker who presents for follow-up of shortness of breath on exertion and cough. He has a history of nonischemic cardiomyopathy and chronic systolic heart failure. Status post pacer defibrillator implant. Prior history of obstructive sleep apnea with intolerance of CPAP in the past.  HPI  History of Present Illness Roberto [name] is a 68 year old male with COPD who presents for follow-up of his condition and management of sleep apnea.  He has a history of COPD and is currently experiencing issues with his breathing. His caregiver notes that his breathing numbers are not too terrible, but he continues to smoke, which is impacting his lung function. He is currently using Trelegy and albuterol  as needed for his COPD management. His main oxygen saturation is in the nineties.  He also has a history of severe sleep apnea, reportedly stopping breathing about forty-nine times per hour during sleep. He previously tried a CPAP machine but had difficulty with the mask due to frequent movement during sleep. He is awaiting new equipment to manage his sleep apnea, and his  caregiver is concerned about the lack of communication regarding the results and equipment delivery.  Patient has severe sleep apnea with AHI of 49.  He will be referred to sleep medicine.  He had pulmonary function testing performed today which is consistent with mild obstructive airways disease and mild diffusion capacity impairment.  The patient's sensation of dyspnea is not consistent with the mild impairment noted on PFTs.  DATA 09/19/2023 CT chest without contrast: Stable aneurysm of the ascending thoracic aorta.  Surgical changes from prosthetic aortic valve.  Defibrillator.  Cardiomegaly.  Subtle nodule in the right lung has resolved which may have been infectious or inflammatory.  Long-term stability of the cavitary small lesion in the left upper lobe.  Gynecomastia. 02/08/2024 CXR PA and lateral: Pacer ICD in place, moderate cardiomegaly, status post aortic valve repair, no pleural effusion or pneumothorax. 03/28/2019 PFTs: FEV1 2.07 L or 69% predicted, FVC 2.69 L or 67% predicted, FEV1/FVC 77%, no bronchodilator response.  FVC is reduced relative to SVC indicating air trapping.  SVC is reduced.  Diffusion capacity normal.  ERV low consistent with obesity.  This is consistent with minimal obstructive airways disease and a possible restrictive component likely related to obesity. 03/14/2024 Home sleep study: Severe sleep apnea with AHI (RDI) of 49.6.  Lowest O2 desat 75%. 03/28/2024 PFTs: FEV1 2.61 L or 93% predicted, FVC 3.28 L or 85% predicted, FEV1/FVC 80%, no bronchodilator response.  Diffusion capacity minimally reduced.  Compared to prior studies of 03/28/2019 there has been significant improvement in spirometric values.  Review of Systems A 10 point review of systems was performed and it is as  noted above otherwise negative.   Patient Active Problem List   Diagnosis Date Noted   Esophageal dysmotility 03/28/2024   Indigestion 03/24/2024   Unintentional weight loss 03/07/2024    Presence of automatic (implantable) cardiac defibrillator 03/07/2024   Presence of heart assist device (HCC) 02/15/2024   Polyp of colon 12/13/2023   PTSD (post-traumatic stress disorder) 11/15/2023   DM type 2 with diabetic peripheral neuropathy (HCC) 10/23/2023   Essential tremor 10/23/2023   Positive occult stool blood test 10/10/2023   Pacemaker 09/11/2023   Persistent atrial fibrillation (HCC) 07/12/2023   Visit for wound check 07/10/2023   Acute on chronic systolic CHF (congestive heart failure) (HCC) 06/22/2023   VT (ventricular tachycardia) (HCC) 06/18/2023   Hypotension 06/18/2023   Atrial fibrillation (HCC) 06/17/2023   Depression 06/17/2023   Tremor of left hand 05/29/2022   Aneurysm of ascending aorta without rupture 02/17/2022   Anticoagulated on Coumadin  07/29/2021   Chronic kidney disease, stage 3a (HCC) 12/09/2020   Hyperlipidemia associated with type 2 diabetes mellitus (HCC) 12/09/2020   Coronary artery disease involving native coronary artery of native heart without angina pectoris 09/10/2019   Osteoarthritis of spine with radiculopathy, lumbar region 11/21/2018   History of aortic stenosis 06/05/2018   Chronic gout without tophus 04/03/2018   Valvular heart disease 01/17/2018   Morbid obesity (HCC) 01/17/2018   Chronic HFrEF (heart failure with reduced ejection fraction) (HCC) 09/27/2016   Renal infarct 05/01/2016   Type 2 diabetes mellitus with cardiac complication (HCC) 04/19/2016   Chronic bilateral low back pain without sciatica 04/19/2016   Thoracic aortic aneurysm without rupture 03/16/2016   Aortic atherosclerosis 03/16/2016   Nicotine  dependence, cigarettes, uncomplicated 03/14/2016   NICM (nonischemic cardiomyopathy) (HCC) 03/02/2016   COPD (chronic obstructive pulmonary disease) (HCC) 09/09/2015   H/O mechanical aortic valve replacement 02/09/2015   Hypertension associated with diabetes (HCC) 02/09/2015    Social History   Tobacco Use   Smoking  status: Every Day    Current packs/day: 0.20    Average packs/day: 0.5 packs/day for 48.3 years (23.8 ttl pk-yrs)    Types: Cigarettes    Start date: 1974    Last attempt to quit: 2021   Smokeless tobacco: Never   Tobacco comments:    Caregiver reports patient currently smokes approximately 2 cigarettes/day.  Substance Use Topics   Alcohol use: Never    Allergies  Allergen Reactions   Nicotine  Hives and Rash    Patches caused localized rash and hives     Current Meds  Medication Sig   albuterol  (PROVENTIL ) (2.5 MG/3ML) 0.083% nebulizer solution USE 1 VIAL IN NEBULIZER EVERY 6 HOURS AS NEEDED FOR WHEEZING FOR SHORTNESS OF BREATH   allopurinol  (ZYLOPRIM ) 100 MG tablet Take 1 tablet (100 mg total) by mouth daily.   amiodarone  (PACERONE ) 200 MG tablet Take 2 tablets by mouth twice daily   baclofen  (LIORESAL ) 10 MG tablet Take 0.5-1 tablets (5-10 mg total) by mouth at bedtime as needed.   buPROPion  (WELLBUTRIN  XL) 300 MG 24 hr tablet Take 300 mg by mouth daily.   dapagliflozin  propanediol (FARXIGA ) 10 MG TABS tablet Take 1 tablet (10 mg total) by mouth daily before breakfast.   enoxaparin  (LOVENOX ) 120 MG/0.8ML injection INJECT 0.72 ML (108MG ) INTO THE SKIN EVERY 12 HOURS   fenofibrate  (TRICOR ) 48 MG tablet Take 48 mg by mouth daily.   Fluticasone -Umeclidin-Vilant (TRELEGY ELLIPTA ) 200-62.5-25 MCG/ACT AEPB Inhale 1 puff into the lungs daily in the afternoon.   losartan  (COZAAR ) 25 MG tablet  Take 25 mg by mouth daily.   metFORMIN  (GLUCOPHAGE -XR) 500 MG 24 hr tablet Take 1 tablet by mouth once daily with breakfast   metoprolol  succinate (TOPROL -XL) 50 MG 24 hr tablet Take 1 tablet (50 mg total) by mouth daily.   mexiletine (MEXITIL ) 150 MG capsule Take 150 mg by mouth 3 (three) times daily.   midodrine  (PROAMATINE ) 2.5 MG tablet Take 1 tablet (2.5 mg total) by mouth 2 (two) times daily with a meal.   Omega-3 Fatty Acids (OMEGA 3 PO) Take 1,500 mg by mouth 2 (two) times daily.    pantoprazole  (PROTONIX ) 40 MG tablet Take 1 tablet (40 mg total) by mouth daily.   rosuvastatin  (CRESTOR ) 40 MG tablet Take 1 tablet (40 mg total) by mouth daily.   Spacer/Aero-Holding Roberto [name] Use as directed Dx: COPD, J44.9   torsemide  (DEMADEX ) 20 MG tablet Take 1 tablet (20 mg total) by mouth daily.   traZODone  (DESYREL ) 50 MG tablet Take 0.5 tablets (25 mg total) by mouth at bedtime as needed for sleep.   VENTOLIN  HFA 108 (90 Base) MCG/ACT inhaler INHALE 2 PUFFS BY MOUTH EVERY 6 HOURS AS NEEDED FOR WHEEZING OR SHORTNESS OF BREATH   warfarin (COUMADIN ) 1 MG tablet Take 1 tablet (1 mg total) by mouth daily. (Patient not taking: Reported on 04/14/2024)   [DISCONTINUED] nortriptyline  (PAMELOR ) 10 MG capsule Take 1 capsule by mouth at bedtime   Current Facility-Administered Medications for the 03/28/24 encounter (Office Visit) with [name], MD  Medication   albuterol  (PROVENTIL ) (2.5 MG/3ML) 0.083% nebulizer solution 2.5 mg    Immunization History  Administered Date(s) Administered   Fluad Quad(high Dose 65+) 05/11/2021, 05/29/2022   Fluad Trivalent(High Dose 65+) 04/10/2023   INFLUENZA, HIGH DOSE SEASONAL PF 03/17/2024   Influenza,inj,Quad PF,6+ Mos 04/19/2016, 05/17/2017, 04/03/2018   PFIZER Comirnaty(Gray Top)Covid-19 Tri-Sucrose Vaccine 10/17/2020   PNEUMOCOCCAL CONJUGATE-20 11/13/2022   Pneumococcal Conjugate-13 05/11/2021   Pneumococcal Polysaccharide-23 06/09/2014   Tdap 06/09/2014, 10/17/2020        Objective:     BP 110/80   Pulse 76   Temp 98 F (36.7 C) (Temporal)   Ht 5' 6 (1.676 m)   Wt 236 lb (107 kg)   SpO2 91%   BMI 38.09 kg/m   SpO2: 91 %  GENERAL: Chronically ill appearing obese gentleman, no acute distress, fully ambulatory. HEAD: Normocephalic, atraumatic.  EYES: Pupils equal, round, reactive to light.  No scleral icterus.  MOUTH: Poor dentition, missing teeth. NECK: Supple. No thyromegaly. Trachea midline. No JVD.  No  adenopathy. PULMONARY: Good air entry bilaterally.  Coarse, rare end expiratory wheeze noted. CARDIOVASCULAR: S1 and S2. Regular rate and rhythm.  ABDOMEN: Obese, protuberant, otherwise benign. MUSCULOSKELETAL: No joint deformity, no clubbing, no edema.  NEUROLOGIC: No overt focal deficit, no gait disturbance, speech is fluent. SKIN: Intact,warm,dry. PSYCH: Mood and behavior normal.   Assessment & Plan:     ICD-10-CM   1. COPD with chronic bronchitis and emphysema (HCC)  J44.89    J43.9     2. Shortness of breath  R06.02     3. OSA (obstructive sleep apnea)  G47.33    SEVERE AHI 49    4. NICM (nonischemic cardiomyopathy) (HCC)  I42.8     5. S/P implantation of automatic cardioverter/defibrillator (AICD)  Z95.810      Discussion:  Chronic obstructive pulmonary disease (COPD) COPD with emphysema, leading to decreased oxygen-carrying capacity. Lung function is declining due to ongoing smoking. - Continue Trelegy - Use albuterol   as needed - Advise smoking cessation to prevent further decline in lung function  Severe obstructive sleep apnea Patient has been referred to sleep medicine.  Tobacco use Ongoing tobacco use contributing to the exacerbation of COPD symptoms. - Advise smoking cessation  Smoking/Tobacco Cessation Counseling Kanen Hargan is a current user of tobacco or nicotine  products. He is not ready to quit at this time. Counseling provided today addressed the risks of continued use and the benefits of cessation. Discussed tobacco/nicotine  use history, readiness to quit, and evidence-based treatment options including behavioral strategies, support resources, and pharmacologic therapies. Provided encouragement and educational materials on steps and resources to quit smoking. Patient questions were addressed, and follow-up recommended for continued support. Total time spent on counseling: 5 minutes.  .   Follow-up in 4 months time call sooner should any new problems  arise.  Patient also will be set up with ACT ready for follow-up on severe sleep apnea  Advised if symptoms do not improve or worsen, to please contact office for sooner follow up or seek emergency care.    I spent 32 minutes of dedicated to the care of this patient on the date of this encounter to include pre-visit review of records, face-to-face time with the patient discussing conditions above, post visit ordering of testing, clinical documentation with the electronic health record, making appropriate referrals as documented, and communicating necessary findings to members of the patients care team.     C. Leita Sanders, MD [facility]    *This note was generated using voice recognition software/Dragon and/or AI transcription program.  Despite best efforts to proofread, errors can occur which can change the meaning. Any transcriptional errors that result from this process are unintentional and may not be fully corrected at the time of dictation.

## 2024-03-28 NOTE — Progress Notes (Signed)
Remote ICD Transmission.

## 2024-03-28 NOTE — Patient Instructions (Signed)
Spiro/ DLCO completed today  

## 2024-03-28 NOTE — Assessment & Plan Note (Signed)
 Barium swallow showed moderate dysmotility Likely the cause of his eating disorder Encourage him to take the Pantoprazole  daily, eat soft foods, chew thoroughly and eat slowly.

## 2024-03-28 NOTE — Telephone Encounter (Signed)
 Order has been placed and Roberto [name] is aware.

## 2024-03-28 NOTE — Telephone Encounter (Signed)
 [name] is aware order for commode has been placed. She is also aware of the update for wound care supplies

## 2024-03-28 NOTE — Telephone Encounter (Signed)
 FYI Only or Action Required?: FYI only for provider.  Patient was last seen in primary care on 03/25/2024 by [name], DO.  Called Nurse Triage reporting Hand Injury.  Symptoms began yesterday.  Interventions attempted: Other: Patient evaluated at ED but states it was only cleaned out and they are concerned he may be developing infection.  Symptoms are: gradually worsening.  Triage Disposition: See Physician Within 24 Hours  Patient/caregiver understands and will follow disposition?: Yes    Copied from CRM #[id number]. Topic: Clinical - Red Word Triage >> Mar 28, 2024  8:39 AM Darshell M wrote: [name] calling in. Red Word that prompted transfer to Nurse Triage: patient fell yesterday outside and had to be taken by EMS to ER. Was discharged but concern he has an infection and his hand is bright cherry red.   ----------------------------------------------------------------------- From previous Reason for Contact - Scheduling: Patient/patient representative is calling to schedule an appointment. Refer to attachments for appointment information. Reason for Disposition  [1] MODERATE pain (e.g., interferes with normal activities) AND [2] high-risk adult (e.g., age > 60 years, osteoporosis, chronic steroid use)  Answer Assessment - Initial Assessment Questions Patient fell yesterday and fell/scraped open knees and hands. Was taken by EMS to ER for evaluation.  1. MECHANISM: How did the injury happen?     Patient was stepping off porch and lost balance. Clemens off of porch. Fell into car.  2. ONSET: When did the injury happen? (e.g., minutes, hours ago)      Yesterday  3. APPEARANCE of INJURY: What does the injury look like?      Patient is scraped up, hand is very red 4. SEVERITY: Can you use your hand normally? Can you bend your fingers into a ball and then fully open them?     Patients hands are both sore from the fall and is having trouble using them 5. SIZE: For cuts,  bruises, or swelling, ask: How large is it? (e.g., inches or centimeters; entire hand)      Patient has open areas on skin, a little puffy, red 6. PAIN: How bad is the pain? (Scale 0-10; or none, mild, moderate, severe)     Patient states it ranges up to 8 7. TETANUS: For any breaks in the skin, ask: When was your last tetanus booster?     Patient has breaks in skin, last Tdap 2022 8. OTHER SYMPTOMS: Do you have any other symptoms?      No fever or chills, just scrapes and bruises  Protocols used: Hand Injury-A-AH

## 2024-03-28 NOTE — Progress Notes (Signed)
Spiro/ DLCO completed today  

## 2024-03-30 ENCOUNTER — Ambulatory Visit: Payer: Self-pay | Admitting: Internal Medicine

## 2024-03-31 ENCOUNTER — Encounter: Payer: Self-pay | Admitting: Family Medicine

## 2024-03-31 NOTE — Assessment & Plan Note (Signed)
 Rechecking labs today. Await results.

## 2024-04-01 ENCOUNTER — Telehealth: Payer: Self-pay | Admitting: Family

## 2024-04-01 ENCOUNTER — Ambulatory Visit: Admitting: Family Medicine

## 2024-04-01 NOTE — Addendum Note (Signed)
 Addended by: [name] on: 04/01/2024 01:23 PM   Modules accepted: Level of Service

## 2024-04-01 NOTE — Telephone Encounter (Signed)
 Called to confirm/remind patient of their appointment at [facility] on 04/01/24.   Appointment:   [] Confirmed  [x] Left mess   [] No answer/No voice mail  [] VM Full/unable to leave message  [] Phone not in service  Patient reminded to bring all medications and/or complete list.  Confirmed patient has transportation. Gave directions, instructed to utilize valet parking.

## 2024-04-02 ENCOUNTER — Encounter: Admitting: Family

## 2024-04-02 ENCOUNTER — Encounter: Payer: Self-pay | Admitting: Pediatrics

## 2024-04-02 ENCOUNTER — Encounter: Payer: Self-pay | Admitting: Family Medicine

## 2024-04-04 ENCOUNTER — Telehealth: Payer: Self-pay | Admitting: Family Medicine

## 2024-04-04 ENCOUNTER — Inpatient Hospital Stay: Admitting: Family Medicine

## 2024-04-04 NOTE — Telephone Encounter (Signed)
 Patient appt was canceled. Caregiver [name] stated that  patient still can not walk on his right leg and that she has not heard anything about the Bed side toilet. Please advise and call if needed.

## 2024-04-06 ENCOUNTER — Other Ambulatory Visit: Payer: Self-pay | Admitting: Family Medicine

## 2024-04-08 NOTE — Telephone Encounter (Signed)
 Requested Prescriptions  Pending Prescriptions Disp Refills   buPROPion  (WELLBUTRIN  SR) 150 MG 12 hr tablet [Pharmacy Med Name: buPROPion  HCl ER (SR) 150 MG Oral Tablet Extended Release 12 Hour] 180 tablet 0    Sig: Take 1 tablet by mouth twice daily     Psychiatry: Antidepressants - bupropion  Failed - 04/08/2024 11:32 AM      Failed - Cr in normal range and within 360 days    Creatinine  Date Value Ref Range Status  05/25/2014 0.85 0.60 - 1.30 mg/dL Final   Creatinine, Ser  Date Value Ref Range Status  03/25/2024 2.60 (H) 0.76 - 1.27 mg/dL Final         Passed - AST in normal range and within 360 days    AST  Date Value Ref Range Status  03/17/2024 37 0 - 40 IU/L Final   SGOT(AST)  Date Value Ref Range Status  05/24/2014 22 15 - 37 Unit/L Final   AST (SGOT) Piccolo, Waived  Date Value Ref Range Status  05/17/2017 31 11 - 38 U/L Final         Passed - ALT in normal range and within 360 days    ALT  Date Value Ref Range Status  03/17/2024 44 0 - 44 IU/L Final   SGPT (ALT)  Date Value Ref Range Status  05/24/2014 20 U/L Final    Comment:    14-63 NOTE: New Reference Range 01/20/14    ALT (SGPT) Piccolo, Waived  Date Value Ref Range Status  05/17/2017 47 10 - 47 U/L Final         Passed - Completed PHQ-2 or PHQ-9 in the last 360 days      Passed - Last BP in normal range    BP Readings from Last 1 Encounters:  03/28/24 128/74         Passed - Valid encounter within last 6 months    Recent Outpatient Visits           1 week ago Cellulitis of right upper extremity   [facility] [facility], Leita DEL, MD   2 weeks ago Supratherapeutic INR   [place] [facility] [place], [place] P, DO   2 weeks ago Supratherapeutic INR   [place] [facility] [place], [name], DO   3 weeks ago Supratherapeutic INR   [facility] [place], [name], DO   1 month ago H/O  mechanical aortic valve replacement   [place] [facility] [place], Megan [place], DO       Future Appointments             In 1 month End, [name], MD [place] [facility]             baclofen  (LIORESAL ) 10 MG tablet [Pharmacy Med Name: Baclofen  10 MG Oral Tablet] 30 tablet 0    Sig: TAKE 1/2 TO 1 (ONE-HALF TO ONE) TABLET BY MOUTH AT BEDTIME AS NEEDED     Analgesics:  Muscle Relaxants - baclofen  Failed - 04/08/2024 11:32 AM      Failed - Cr in normal range and within 180 days    Creatinine  Date Value Ref Range Status  05/25/2014 0.85 0.60 - 1.30 mg/dL Final   Creatinine, Ser  Date Value Ref Range Status  03/25/2024 2.60 (H) 0.76 - 1.27 mg/dL Final         Failed - eGFR is  30 or above and within 180 days    EGFR (African American)  Date Value Ref Range Status  05/25/2014 >60 >76mL/min Final   GFR calc Af Amer  Date Value Ref Range Status  05/12/2020 64 >59 mL/min/1.73 Final    Comment:    **In accordance with recommendations from the NKF-ASN Task force,**   [facility] is in the process of updating its eGFR calculation to the   2021 CKD-EPI creatinine equation that estimates kidney function   without a race variable.    EGFR (Non-African Amer.)  Date Value Ref Range Status  05/25/2014 >60 >45mL/min Final    Comment:    eGFR values <60mL/min/1.73 m2 may be an indication of chronic kidney disease (CKD). Calculated eGFR, using the MRDR Study equation, is useful in  patients with stable renal function. The eGFR calculation will not be reliable in acutely ill patients when serum creatinine is changing rapidly. It is not useful in patients on dialysis. The eGFR calculation may not be applicable to patients at the low and high extremes of body sizes, pregnant women, and vegetarians.    GFR, Estimated  Date Value Ref Range Status  03/19/2024 33 (L) >60 mL/min Final    Comment:    (NOTE) Calculated using the CKD-EPI Creatinine Equation  (2021)    eGFR  Date Value Ref Range Status  03/25/2024 26 (L) >59 mL/min/1.73 Final         Passed - Valid encounter within last 6 months    Recent Outpatient Visits           1 week ago Cellulitis of right upper extremity   [place] [facility], Leita DEL, MD   2 weeks ago Supratherapeutic INR   [facility] [facility] [place], [place] P, DO   2 weeks ago Supratherapeutic INR   [facility] [place], [name], DO   3 weeks ago Supratherapeutic INR   [place] [facility] [place], [name], DO   1 month ago H/O mechanical aortic valve replacement   [place] [facility] [place], [name], DO       Future Appointments             In 1 month End, [name], MD [place] [facility]             nortriptyline  (PAMELOR ) 10 MG capsule [Pharmacy Med Name: Nortriptyline  HCl 10 MG Oral Capsule] 90 capsule 0    Sig: Take 1 capsule by mouth at bedtime     Psychiatry:  Antidepressants - Heterocyclics (TCAs) Passed - 04/08/2024 11:32 AM      Passed - Completed PHQ-2 or PHQ-9 in the last 360 days      Passed - Valid encounter within last 6 months    Recent Outpatient Visits           1 week ago Cellulitis of right upper extremity   [place] [facility], Leita DEL, MD   2 weeks ago Supratherapeutic INR   [place] [name] [place], [place] P, DO   2 weeks ago Supratherapeutic INR   [place] [facility] [place], [name], DO   3 weeks ago Supratherapeutic INR   [facility] [facility] [place], [name], DO   1 month ago H/O mechanical aortic valve replacement   [place] [place], [place], DO  Future Appointments             In 1 month End, [name], MD [facility]

## 2024-04-09 ENCOUNTER — Encounter: Payer: Self-pay | Admitting: Pulmonary Disease

## 2024-04-09 ENCOUNTER — Encounter: Payer: Self-pay | Admitting: Pediatrics

## 2024-04-09 ENCOUNTER — Telehealth: Payer: Self-pay | Admitting: Family Medicine

## 2024-04-09 DIAGNOSIS — J449 Chronic obstructive pulmonary disease, unspecified: Secondary | ICD-10-CM | POA: Diagnosis not present

## 2024-04-09 DIAGNOSIS — I5022 Chronic systolic (congestive) heart failure: Secondary | ICD-10-CM | POA: Diagnosis not present

## 2024-04-09 DIAGNOSIS — I4891 Unspecified atrial fibrillation: Secondary | ICD-10-CM | POA: Diagnosis not present

## 2024-04-09 NOTE — Telephone Encounter (Signed)
 Called patient to get him scheduled for an appt with Dr Vicci on Friday in one of the open spots she has. Left vm

## 2024-04-14 ENCOUNTER — Other Ambulatory Visit: Payer: Self-pay

## 2024-04-14 NOTE — Patient Instructions (Signed)
 Thank you for allowing the Complex Care Management team to participate in your care.

## 2024-04-14 NOTE — Patient Outreach (Unsigned)
 Complex Care Management   Visit Note  04/14/2024  Name:  Roberto [name] MRN: [id number] DOB: Jun 17, 1956  Situation: Referral received for Complex Care Management related to {Criteria:32550} I obtained verbal consent from {CHL AMB Patient/Caregiver:28184}.  Visit completed with {CHL AMB Patient/Caregiver:28184}  {VISIT LOCATION:32553}  Background:   Past Medical History:  Diagnosis Date   Aneurysm    Bicuspid aortic valve    a. s/p #27 Carbomedics mechanical valve on 03/25/2010; b. on Coumadin ; c. TTE 12/17: EF 40-45%, moderately dilated LV with moderate LVH, AVR well-seated with 14 mmHg gradient, peak AV velocity 2.5 m/s, mild mitral valve thickening with mild MR, mildly dilated RV with mildly reduced contraction   Cellulitis    CHF (congestive heart failure) (HCC)    Chronic kidney disease    Chronic systolic CHF (congestive heart failure) (HCC)    a. R/LHC 03/2010 showed no significant CAD, LVEDP 31 mmHg, mean AoV gradient 34 mmHg at rest and 47 mmHg with dobutamine 20 mcg/kg/min, AVA 1.0 cm^2, RA 31, RV 68/25, PA 68/47, PCWP 38. PA sat 65%. CO 6.2 L/min (Fick) and 5.3 L/min (thermodilution)   Clotting disorder    COPD (chronic obstructive pulmonary disease) (HCC)    H/O mechanical aortic valve replacement 03/25/2010   a. 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   Stage 3 chronic kidney disease (HCC)    Stroke (HCC)    TIA (transient ischemic attack) 05/2014    Assessment: Patient Reported Symptoms:  Cognitive        Neurological      HEENT        Cardiovascular      Respiratory      Endocrine      Gastrointestinal        Genitourinary      Integumentary      Musculoskeletal          Psychosocial       Quality of Family Relationships: supportive, involved, helpful Do you feel physically threatened by  others?: No    04/14/2024    PHQ2-9 Depression Screening   Little interest or pleasure in doing things    Feeling down, depressed, or hopeless    PHQ-2 - Total Score    Trouble falling or staying asleep, or sleeping too much    Feeling tired or having little energy    Poor appetite or overeating     Feeling bad about yourself - or that you are a failure or have let yourself or your family down    Trouble concentrating on things, such as reading the newspaper or watching television    Moving or speaking so slowly that other people could have noticed.  Or the opposite - being so fidgety or restless that you have been moving around a lot more than usual    Thoughts that you would be better off dead, or hurting yourself in some way    PHQ2-9 Total Score    If you checked off any problems, how difficult have these problems made it for you to do your work, take care of things at home, or get along with other people    Depression Interventions/Treatment      There were no vitals filed for this visit.  Medications Reviewed Today     Reviewed by Karoline Lima, RN (Registered Nurse)  on 04/14/24 at 1110  Med List Status: <None>   Medication Order Taking? Sig Documenting Provider Last Dose Status Informant  albuterol  (PROVENTIL ) (2.5 MG/3ML) 0.083% nebulizer solution [id number]  USE 1 VIAL IN NEBULIZER EVERY 6 HOURS AS NEEDED FOR WHEEZING FOR SHORTNESS OF BREATH [name], [name], DO  Active Family Member, Pharmacy Records  albuterol  (PROVENTIL ) (2.5 MG/3ML) 0.083% nebulizer solution 2.5 mg [id number]   Vicci Bouchard P, DO  Active   allopurinol  (ZYLOPRIM ) 100 MG tablet [id number]  Take 1 tablet (100 mg total) by mouth daily. [name], MD  Active   amiodarone  (PACERONE ) 200 MG tablet [id number]  Take 2 tablets by mouth twice daily End, [name], MD  Active   baclofen  (LIORESAL ) 10 MG tablet [id number]  Take 0.5-1 tablets (5-10 mg total) by mouth at bedtime as needed. [name], [name], DO   Active   buPROPion  (WELLBUTRIN  XL) 300 MG 24 hr tablet [id number]  Take 300 mg by mouth daily. [provider]  Active Family Member, Pharmacy Records           Med Note LAVADA, PAULINA MARLA Schaumann Sep 27, 2023 10:02 AM)    dapagliflozin  propanediol (FARXIGA ) 10 MG TABS tablet [id number]  Take 1 tablet (10 mg total) by mouth daily before breakfast. End, [name], MD  Active   enoxaparin  (LOVENOX ) 120 MG/0.8ML injection [id number]  INJECT 0.72 ML (108MG ) INTO THE SKIN EVERY 12 HOURS [name], [name], DO  Active   fenofibrate  (TRICOR ) 48 MG tablet [id number]  Take 48 mg by mouth daily. [provider]  Active Family Member, Pharmacy Records  Fluticasone -Umeclidin-Vilant (TRELEGY ELLIPTA ) 200-62.5-25 MCG/ACT AEPB [id number]  Inhale 1 puff into the lungs daily in the afternoon. [name], MD  Active Family Member, Pharmacy Records  losartan  (COZAAR ) 25 MG tablet [id number]  Take 25 mg by mouth daily. [provider]  Active   metFORMIN  (GLUCOPHAGE -XR) 500 MG 24 hr tablet [id number]  Take 1 tablet by mouth once daily with breakfast Vicci, [name], DO  Active   metoprolol  succinate (TOPROL -XL) 50 MG 24 hr tablet [id number]  Take 1 tablet (50 mg total) by mouth daily. Rolan Ezra RAMAN, MD  Active   mexiletine (MEXITIL ) 150 MG capsule [id number]  Take 150 mg by mouth 3 (three) times daily. [provider]  Active Family Member, Pharmacy Records  midodrine  (PROAMATINE ) 2.5 MG tablet [id number]  Take 1 tablet (2.5 mg total) by mouth 2 (two) times daily with a meal. Vicci Bouchard SQUIBB, DO  Active Family Member, Pharmacy Records  nortriptyline  (PAMELOR ) 10 MG capsule [id number]  Take 1 capsule by mouth at bedtime Vicci Bouchard P, DO  Active   Omega-3 Fatty Acids (OMEGA 3 PO) [id number]  Take 1,500 mg by mouth 2 (two) times daily. [provider]  Active Family Member, Pharmacy Records  pantoprazole  (PROTONIX ) 40 MG tablet [id number]  Take 1 tablet (40 mg total) by mouth  daily. Suzann Inocente HERO, MD  Active   rosuvastatin  (CRESTOR ) 40 MG tablet [id number]  Take 1 tablet (40 mg total) by mouth daily. [name], MD  Active Family Member, Pharmacy Records  Spacer/Aero-Holding [place] DEVI [id number]  Use as directed Dx: COPD, J44.9 Vicci Bouchard SQUIBB, DO  Active Family Member, Pharmacy Records  torsemide  (DEMADEX ) 20 MG tablet [id number]  Take 1 tablet (20 mg total) by mouth daily. Rolan Ezra RAMAN, MD  Active   traZODone  (DESYREL ) 50 MG tablet [id number]  Take 0.5 tablets (25 mg total) by mouth at  bedtime as needed for sleep. Vicci Bouchard P, DO  Active   VENTOLIN  HFA 108 (90 Base) MCG/ACT inhaler [id number]  INHALE 2 PUFFS BY MOUTH EVERY 6 HOURS AS NEEDED FOR WHEEZING OR SHORTNESS OF BREATH [name], [name], DO  Active Family Member, Pharmacy Records  warfarin (COUMADIN ) 1 MG tablet [id number]  Take 1 tablet (1 mg total) by mouth daily.  Patient taking differently: Take 2 mg by mouth daily.   Vicci Bouchard SQUIBB, DO  Active Family Member, Pharmacy Records  Med List Note (Hagopian, Tyelisha L, CPhT 08/27/23 1743): DO NOT REFILL MEDICATIONS WITHOUT AN APPOINTMENT / Friend Ms. [name] assists with his medication            Recommendation:   {RECOMMENDATONS:32554}  Follow Up Plan:   {FOLLOWUP:32559}  SIG ***

## 2024-04-16 ENCOUNTER — Encounter: Admitting: Cardiology

## 2024-04-16 ENCOUNTER — Encounter: Payer: Self-pay | Admitting: Pulmonary Disease

## 2024-04-16 ENCOUNTER — Telehealth: Payer: Self-pay | Admitting: Family Medicine

## 2024-04-16 NOTE — Telephone Encounter (Signed)
 Copied from CRM [id number]. Topic: Appointments - Scheduling Inquiry for Clinic >> Apr 16, 2024  4:13 PM Antwanette L wrote: Reason for CRM: [name], the patient's caregiver, scheduled an acute visit with Dr. Vicci for October 27. However, she is requesting an earlier appointment between October 21 and 23. I informed her that the next available opening with Dr. Vicci is November 12. The patient has been added to the wait list. [name] is requesting a callback at [phone].

## 2024-04-17 NOTE — Telephone Encounter (Signed)
 Scheduled

## 2024-04-17 NOTE — Telephone Encounter (Signed)
 I really don't have anything during that time frame. I can probably double book him at 11:20 on Wednesday- but that would be it.

## 2024-04-21 ENCOUNTER — Encounter: Payer: Self-pay | Admitting: Family Medicine

## 2024-04-21 DIAGNOSIS — G4733 Obstructive sleep apnea (adult) (pediatric): Secondary | ICD-10-CM | POA: Diagnosis not present

## 2024-04-22 ENCOUNTER — Telehealth: Payer: Self-pay | Admitting: Family

## 2024-04-22 NOTE — Progress Notes (Unsigned)
 Advanced Heart Failure Clinic Note   PCP: [name], DO HF Cardiology: Dr. Rolan  Chief complaint: shortness of breath   HPI:  Mr Roupp is a 68 y.o. male with a history of bicuspid aortic valve disorder s/p mechanical AVR in 9/11 and 5.5 cm ascending aortic aneurysm, chronic systolic CHF, and smoking was initially referred by Dr. German for optimization of CHF prior to ascending aorta replacement surgery.  Echo in 7/20 showed EF 40-45% with stable mechanical aortic valve.  RHC/LHC in 7/20 showed nonobstructive CAD and R>L sided failure.    CHF has worsened over the last year. Patient was admitted to [facility] in 12/24 with VT, was started on amiodarone .  Cardiac MRI in 12/24 showed LV EF 20%, RV EF 41%, mid-myocardial basal septal LGE (nonspecific non-coronary pattern).  RHC/LHC in 12/24 showed nonobstructive CAD, elevated filling pressures, CI 1.9.   He had a St Jude ICD placed. In 2/25, he was admitted with atrial fibrillation and had DCCV to NSR. Echo in 3/25 showed EF 25-30% with severe LV dilation, normal RV size with mild systolic dysfunction, mechanical aortic valve with mean gradient 13 mmHg.    CT chest in 3/25 showed 5.6 cm ascending aorta aneurysm.   He was admitted at [facility] in 8/25 with VT, device was upgraded to [facility] CRT-D.    He was admitted to [facility] in 9/25 with nausea/vomiting/dehydration in setting of GLP-1 agonist use.  He had AKI. Torsemide  was stopped.   Had RHC 03/21/24:  1.Normal PCWP 2. Mild mixed pulmonary arterial/pulmonary venous hypertension 3. Predominant RV failure with prominent V waves in the RA tracing suggestive of significant tricuspid regurgitation, mean RA pressure 13.  PAP is low.  4. CO is low but not markedly low.  5. Torsemide  started.   Was in the ED 03/26/24 after a mechanical fall. Says that he tripped on concrete landing on his knees. Witnessed fall without LOC or head injuary.     He presents today for a HF follow-up visit with a chief  complaint of minimal shortness of breath. Has associated fatigue, right knee pain since his fall, decreased appetite. Denies chest pain but says that 4 days ago, he felt a weird sensation over his device. Unable to further elaborate other than it didn't feel like when you licked a battery. Denies shocking of device. Has upcoming EP appointment 05/08/24. Started wearing CPAP just last night.   Labs (9/25): K 3.1, creatinine 1.97, hgb 14, BNP 421 Labs (9/25): K 3.4, creatinine 2.6  St Jude device interrogation: 94% BiV pacing, thoracic impedance not low, 6 second episode of NSVT early September  ECG: V-paced with PAC, aberrant conduction, HR 80. (Personally reviewed)  PMH: 1. Bicuspid aortic valve: s/p mechanical AVR 9/11 - Aortic valve stable on 3/25 echo.  2. H/o CVA 3. Hyperlipidemia 4. H/o renal infarct 5. Chronic systolic CHF: Nonischemic cardiomyopathy. St Jude CRT-D device.  - Echo (7/20): EF 40-45%, normal RV, mechanical aortic valve with mean gradient 15 mmHg - LHC/RHC (7/20): 60% stenosis D1; mean RA 22, RV 45/26 mean 36, mean PCWP 23, CI 2.4 - Cardiac MRI (12/24): LV EF 20%, RV EF 41%, mid-myocardial basal septal LGE (nonspecific non-coronary pattern). - LHC/RHC (12/24): 60% D1; mean RA 25, PA 50/28 mean 35, mean PCWP 30, CI 1.9.  - Echo (3/25): EF 25-30% with severe LV dilation, normal RV size with mild systolic dysfunction, mechanical aortic valve with mean gradient 13 mmHg.   6. Ascending aortic aneurysm: Suspect related  to bicuspid aortic valve.  - CTA (7/20) with 5.5 cm ascending aorta.  - CT chest w/o contrast (3/25): 5.6 cm ascending aorta.  7. OSA: moderate.  8. CKD stage 3 9. Type 2 diabetes 10. VT: 12/24, 8/25.  11. Atrial fibrillation/flutter: DCCV in 2/25.  12. Gout  Social History   Socioeconomic History   Marital status: Divorced    Spouse name: Not on file   Number of children: 1   Years of education: 14   Highest education level: Associate degree:  occupational, Scientist, product/process development, or vocational program  Occupational History   Occupation: Disabled    Employer: UNEMPLOYED  Tobacco Use   Smoking status: Every Day    Current packs/day: 0.20    Average packs/day: 0.5 packs/day for 48.3 years (23.8 ttl pk-yrs)    Types: Cigarettes    Start date: 1974    Last attempt to quit: 2021   Smokeless tobacco: Never   Tobacco comments:    Caregiver reports patient currently smokes approximately 2 cigarettes/day.  Vaping Use   Vaping status: Never Used  Substance and Sexual Activity   Alcohol use: Never   Drug use: Never   Sexual activity: Not Currently  Other Topics Concern   Not on file  Social History Narrative   Not on file   Social Drivers of Health   Financial Resource Strain: High Risk (04/21/2024)   Overall Financial Resource Strain (CARDIA)    Difficulty of Paying Living Expenses: Hard  Food Insecurity: Food Insecurity Present (04/21/2024)   Hunger Vital Sign    Worried About Running Out of Food in the Last Year: Sometimes true    Ran Out of Food in the Last Year: Sometimes true  Transportation Needs: Unmet Transportation Needs (04/21/2024)   PRAPARE - Administrator, Civil Service (Medical): Yes    Lack of Transportation (Non-Medical): No  Physical Activity: Inactive (04/21/2024)   Exercise Vital Sign    Days of Exercise per Week: 0 days    Minutes of Exercise per Session: Not on file  Stress: Stress Concern Present (04/21/2024)   [employer] of Occupational Health - Occupational Stress Questionnaire    Feeling of Stress: Very much  Social Connections: Socially Isolated (04/21/2024)   Social Connection and Isolation Panel    Frequency of Communication with Friends and Family: Never    Frequency of Social Gatherings with Friends and Family: Never    Attends Religious Services: Never    Database administrator or Organizations: No    Attends Engineer, structural: Not on file    Marital Status: Living  with partner  Intimate Partner Violence: Not At Risk (03/07/2024)   Humiliation, Afraid, Rape, and Kick questionnaire    Fear of Current or Ex-Partner: No    Emotionally Abused: No    Physically Abused: No    Sexually Abused: No   Family History  Problem Relation Age of Onset   Arthritis Mother    Dementia Mother    Colon cancer Mother    Arthritis Father    Diabetes Father    Stroke Father    Colon cancer Father    Heart attack Brother    Breast cancer Sister    Seizures Sister    Cancer Brother        brain   Heart disease Brother    Heart attack Brother    ROS: All systems reviewed and negative except as per HPI.  Current Outpatient Medications  Medication Sig  Dispense Refill   albuterol  (PROVENTIL ) (2.5 MG/3ML) 0.083% nebulizer solution USE 1 VIAL IN NEBULIZER EVERY 6 HOURS AS NEEDED FOR WHEEZING FOR SHORTNESS OF BREATH 180 mL 1   allopurinol  (ZYLOPRIM ) 100 MG tablet Take 1 tablet (100 mg total) by mouth daily. 30 tablet 0   amiodarone  (PACERONE ) 200 MG tablet Take 2 tablets by mouth twice daily 180 tablet 3   baclofen  (LIORESAL ) 10 MG tablet Take 0.5-1 tablets (5-10 mg total) by mouth at bedtime as needed. 30 each 0   buPROPion  (WELLBUTRIN  XL) 300 MG 24 hr tablet Take 300 mg by mouth daily.     dapagliflozin  propanediol (FARXIGA ) 10 MG TABS tablet Take 1 tablet (10 mg total) by mouth daily before breakfast. 90 tablet 3   enoxaparin  (LOVENOX ) 120 MG/0.8ML injection INJECT 0.72 ML (108MG ) INTO THE SKIN EVERY 12 HOURS 54 mL 3   fenofibrate  (TRICOR ) 48 MG tablet Take 48 mg by mouth daily.     Fluticasone -Umeclidin-Vilant (TRELEGY ELLIPTA ) 200-62.5-25 MCG/ACT AEPB Inhale 1 puff into the lungs daily in the afternoon. 1 each 0   losartan  (COZAAR ) 25 MG tablet Take 25 mg by mouth daily.     metFORMIN  (GLUCOPHAGE -XR) 500 MG 24 hr tablet Take 1 tablet by mouth once daily with breakfast 90 tablet 0   metoprolol  succinate (TOPROL -XL) 50 MG 24 hr tablet Take 1 tablet (50 mg total) by  mouth daily. 30 tablet 4   mexiletine (MEXITIL ) 150 MG capsule Take 150 mg by mouth 3 (three) times daily.     midodrine  (PROAMATINE ) 2.5 MG tablet Take 1 tablet (2.5 mg total) by mouth 2 (two) times daily with a meal. 60 tablet 3   nortriptyline  (PAMELOR ) 10 MG capsule Take 1 capsule by mouth at bedtime 90 capsule 0   Omega-3 Fatty Acids (OMEGA 3 PO) Take 1,500 mg by mouth 2 (two) times daily.     pantoprazole  (PROTONIX ) 40 MG tablet Take 1 tablet (40 mg total) by mouth daily. 30 tablet 3   rosuvastatin  (CRESTOR ) 40 MG tablet Take 1 tablet (40 mg total) by mouth daily. 90 tablet 3   Spacer/Aero-Holding [facility] Use as directed Dx: COPD, J44.9 1 each 1   torsemide  (DEMADEX ) 20 MG tablet Take 1 tablet (20 mg total) by mouth daily. 60 tablet 3   traZODone  (DESYREL ) 50 MG tablet Take 0.5 tablets (25 mg total) by mouth at bedtime as needed for sleep. 30 tablet 3   VENTOLIN  HFA 108 (90 Base) MCG/ACT inhaler INHALE 2 PUFFS BY MOUTH EVERY 6 HOURS AS NEEDED FOR WHEEZING OR SHORTNESS OF BREATH 54 g 6   warfarin (COUMADIN ) 1 MG tablet Take 1 tablet (1 mg total) by mouth daily. (Patient not taking: Reported on 04/14/2024) 30 tablet 3   Current Facility-Administered Medications  Medication Dose Route Frequency Provider Last Rate Last Admin   albuterol  (PROVENTIL ) (2.5 MG/3ML) 0.083% nebulizer solution 2.5 mg  2.5 mg Nebulization Once        Vitals:   04/23/24 0936  BP: 118/82  Pulse: 80  Weight: 221 lb (100.2 kg)  Height: 5' 6 (1.676 m)   Wt Readings from Last 3 Encounters:  04/23/24 221 lb (100.2 kg)  04/23/24 221 lb (100.2 kg)  03/28/24 236 lb (107 kg)   Lab Results  Component Value Date   CREATININE 2.60 (H) 03/25/2024   CREATININE 2.11 (H) 03/19/2024   CREATININE 1.97 (H) 03/17/2024    Physical Exam:   General: Well appearing.  Cor: No JVD. Regular rhythm,  rate. 2/6 SEM at RUSB Lungs: clear Abdomen: soft, nontender, nondistended. Extremities: no edema Neuro:. Affect  pleasant   Assessment/Plan: 1. Chronic systolic CHF:  Nonischemic cardiomyopathy.  St Jude CRT-D device. Nonobstructive CAD on caths in 7/20 and 12/24. Cardiac MRI in 12/24 showed LV EF 20%, RV EF 41%, mid-myocardial basal septal LGE (nonspecific non-coronary pattern).  Echo in 3/25 showed EF 25-30% with severe LV dilation, normal RV size with mild systolic dysfunction, mechanical aortic valve with mean gradient 13 mmHg.  BP has been running low with some orthostatic-type symptoms.  He has required midodrine  to keep up his BP. He does not look volume overloaded to me by exam and [place]vue does not suggest volume overload.  Recent AKI. Concern for low output state, especially given CI 2.2 on RHC in 09/25, although improved from 1.9 12/24.  His symptoms are probably class III; he seems more bothered by his dysphagia/ weight loss. - Continue Toprol  XL 50 mg daily with low BP. Feels better since this was reduced from 200mg  daily.  - He will continue midodrine , BP is looking better so consider weaning at next visit - Continue torsemide  20mg  daily (started after 09/25 RHC) - Continue losartan  25mg  daily. BP low in the past so hesitant to switch to entresto - Renal function will not allow for MRA. Most recent GFR 26 - EKG today is Vpaced with aberrant conduction/ PAC 2. OSA: Moderate OSA. Started wearing CPAP last night 3. CAD: Nonobstructive on 7/20 and 12/24 caths. LDL 03/25 was 47, aim for LDL at least < 70.  - Continue Crestor  40 mg daily, Fenofibrate  48mg  daily..    4. Mechanical aortic valve: Stable function on 3/25 echo.  Given history of CVA, should have Lovenox  bridging when he needs to hold warfarin.  Goal INR 2.5-3.5 with additional history of atrial fibrillation. INR 03/27/24 was 5.8.  5. Ascending aortic aneurysm: Suspect this is related to his bicuspid aortic valve.  Last CT chest w/o contrast in 3/25 showed aneurysm 5.6 cm.  He would be high risk for repair.  This will also complicate  consideration of LVAD.  6. Obesity: has been tolerating ozempic  1mg  weekly 7. VT: He has St Jude CRT-D.  - Continue amiodarone  200 mg daily.  Follow LFTs, TSH.  He will need regular eye exam.  - Continue mexiletine 150 mg tid.  - unclear of what sensation not like licking a battery is. Has EP follow-up in 2 weeks. 1 episode of NSVT > 1 month ago - Discussed with Dr Rolan - BMET/ Mg today. Last K+ was slightly low at 3.4 (09/25) - saw nephrology Geoffry) 09/25 8. Atrial fibrillation/flutter: DCCV in 2/25. Follows with EP.  9. Dysphagia: This is a major complaint. Decreased appetite with weight loss. Has lost 19 pounds since last visit here 5 weeks ago. - saw GI 09/25   Return in 1 month, sooner if needed.   I spent 40 minutes reviewing records, interviewing/ examing patient and managing plan/ orders.    Ellouise DELENA Class, FNP 04/22/24

## 2024-04-22 NOTE — Telephone Encounter (Signed)
 Called to confirm/remind patient of their appointment at the [facility] on 04/23/24.   Appointment:   [x] Confirmed  [] Left mess   [] No answer/No voice mail  [] VM Full/unable to leave message  [] Phone not in service  Patient reminded to bring all medications and/or complete list.  Confirmed patient has transportation. Gave directions, instructed to utilize valet parking.

## 2024-04-23 ENCOUNTER — Encounter: Payer: Self-pay | Admitting: Family

## 2024-04-23 ENCOUNTER — Ambulatory Visit: Attending: Family | Admitting: Family

## 2024-04-23 ENCOUNTER — Ambulatory Visit (INDEPENDENT_AMBULATORY_CARE_PROVIDER_SITE_OTHER): Admitting: Family Medicine

## 2024-04-23 ENCOUNTER — Encounter: Payer: Self-pay | Admitting: Family Medicine

## 2024-04-23 VITALS — BP 82/60 | Temp 97.8°F | Ht 66.0 in | Wt 221.0 lb

## 2024-04-23 VITALS — BP 118/82 | HR 80 | Ht 66.0 in | Wt 221.0 lb

## 2024-04-23 DIAGNOSIS — Z952 Presence of prosthetic heart valve: Secondary | ICD-10-CM | POA: Insufficient documentation

## 2024-04-23 DIAGNOSIS — I5022 Chronic systolic (congestive) heart failure: Secondary | ICD-10-CM

## 2024-04-23 DIAGNOSIS — Z79899 Other long term (current) drug therapy: Secondary | ICD-10-CM | POA: Insufficient documentation

## 2024-04-23 DIAGNOSIS — E861 Hypovolemia: Secondary | ICD-10-CM | POA: Diagnosis not present

## 2024-04-23 DIAGNOSIS — E669 Obesity, unspecified: Secondary | ICD-10-CM | POA: Diagnosis not present

## 2024-04-23 DIAGNOSIS — I2510 Atherosclerotic heart disease of native coronary artery without angina pectoris: Secondary | ICD-10-CM | POA: Diagnosis not present

## 2024-04-23 DIAGNOSIS — Z6835 Body mass index (BMI) 35.0-35.9, adult: Secondary | ICD-10-CM | POA: Diagnosis not present

## 2024-04-23 DIAGNOSIS — R1310 Dysphagia, unspecified: Secondary | ICD-10-CM | POA: Diagnosis not present

## 2024-04-23 DIAGNOSIS — F17210 Nicotine dependence, cigarettes, uncomplicated: Secondary | ICD-10-CM | POA: Diagnosis not present

## 2024-04-23 DIAGNOSIS — Z95810 Presence of automatic (implantable) cardiac defibrillator: Secondary | ICD-10-CM | POA: Diagnosis not present

## 2024-04-23 DIAGNOSIS — I152 Hypertension secondary to endocrine disorders: Secondary | ICD-10-CM | POA: Diagnosis not present

## 2024-04-23 DIAGNOSIS — J432 Centrilobular emphysema: Secondary | ICD-10-CM

## 2024-04-23 DIAGNOSIS — R0602 Shortness of breath: Secondary | ICD-10-CM | POA: Diagnosis present

## 2024-04-23 DIAGNOSIS — Z7901 Long term (current) use of anticoagulants: Secondary | ICD-10-CM | POA: Diagnosis not present

## 2024-04-23 DIAGNOSIS — Z7984 Long term (current) use of oral hypoglycemic drugs: Secondary | ICD-10-CM | POA: Insufficient documentation

## 2024-04-23 DIAGNOSIS — I25118 Atherosclerotic heart disease of native coronary artery with other forms of angina pectoris: Secondary | ICD-10-CM | POA: Diagnosis not present

## 2024-04-23 DIAGNOSIS — E1159 Type 2 diabetes mellitus with other circulatory complications: Secondary | ICD-10-CM

## 2024-04-23 DIAGNOSIS — I472 Ventricular tachycardia, unspecified: Secondary | ICD-10-CM | POA: Diagnosis not present

## 2024-04-23 DIAGNOSIS — I7121 Aneurysm of the ascending aorta, without rupture: Secondary | ICD-10-CM | POA: Insufficient documentation

## 2024-04-23 DIAGNOSIS — I4811 Longstanding persistent atrial fibrillation: Secondary | ICD-10-CM

## 2024-04-23 DIAGNOSIS — I428 Other cardiomyopathies: Secondary | ICD-10-CM | POA: Insufficient documentation

## 2024-04-23 DIAGNOSIS — I4891 Unspecified atrial fibrillation: Secondary | ICD-10-CM | POA: Insufficient documentation

## 2024-04-23 DIAGNOSIS — G4733 Obstructive sleep apnea (adult) (pediatric): Secondary | ICD-10-CM | POA: Diagnosis not present

## 2024-04-23 DIAGNOSIS — R634 Abnormal weight loss: Secondary | ICD-10-CM

## 2024-04-23 LAB — COAGUCHEK XS/INR WAIVED
INR: 1.3 — ABNORMAL HIGH (ref 0.9–1.1)
Prothrombin Time: 15.8 s

## 2024-04-23 MED ORDER — ONDANSETRON 4 MG PO TBDP
4.0000 mg | ORAL_TABLET | Freq: Three times a day (TID) | ORAL | 1 refills | Status: DC | PRN
Start: 2024-04-23 — End: 2024-04-25

## 2024-04-23 MED ORDER — BACLOFEN 10 MG PO TABS: 5.0000 mg | ORAL_TABLET | Freq: Every evening | ORAL | 0 refills | Status: DC | PRN

## 2024-04-23 NOTE — Assessment & Plan Note (Signed)
 Continues to not be able to eat without vomiting. Will get him back into see GI as he's not doing any better. Has lost an additional 15lbs in the last month. We will start him on zofran  prior to eating to see if we can help control vomiting and increase intake. Continue to monitor closely- recheck 2 weeks.

## 2024-04-23 NOTE — Assessment & Plan Note (Signed)
 Doing well on lovenox  right now. Will continue lovenox  until he is able to eat regularly again as we have not been able to effectively control his INR with coumadin . Call with any concerns. Recheck in about 2 weeks.

## 2024-04-23 NOTE — Assessment & Plan Note (Signed)
 BP continues to run very low. BP improved after 2 bottles of water  in the office. Continue midodrine  and continue to monitor closely. Labs drawn today. Await results.

## 2024-04-23 NOTE — Assessment & Plan Note (Signed)
 Continue to follow with heart failure clinic. Significant concern that worsening heart failure is causing issues with vomiting. Euvolemic today.

## 2024-04-23 NOTE — Progress Notes (Signed)
 BP (!) 82/60   Temp 97.8 F (36.6 C) (Oral)   Ht 5' 6 (1.676 m)   Wt 221 lb (100.2 kg)   SpO2 (!) 84%   BMI 35.67 kg/m    Subjective:    Patient ID: Roberto [name], male    DOB: June 05, 1956, 68 y.o.   MRN: [id number]  HPI: Roberto [name] is a 68 y.o. male  Chief Complaint  Patient presents with   Weight Loss    Not eating    Extremity Weakness    Both legs    Fall    Last sunday   Roberto [name] presents today for follow up. He saw the heart failure clinic this morning. He notes that he has not been doing well. He has continued to not be able to eat- he has been vomiting after a couple of bites of eating. He notes that he is generally able to eat cold things easier than hot things. He has been able to eat a little bit of soup, but in general his intake continues to be very low. He has been very tired and very weak- he has fallen 2x in the last month because he is barely able to keep his legs under him now due to weakness. He has been using a wheelchair.   Relevant past medical, surgical, family and social history reviewed and updated as indicated. Interim medical history since our last visit reviewed. Allergies and medications reviewed and updated.  Review of Systems  Constitutional:  Positive for activity change and fatigue. Negative for appetite change, chills, diaphoresis, fever and unexpected weight change.  Eyes: Negative.   Respiratory: Negative.    Cardiovascular: Negative.   Gastrointestinal:  Positive for vomiting. Negative for abdominal distention, abdominal pain, anal bleeding, blood in stool, constipation, diarrhea, nausea and rectal pain.  Musculoskeletal: Negative.   Neurological: Negative.   Psychiatric/Behavioral: Negative.      Per HPI unless specifically indicated above     Objective:    BP (!) 82/60   Temp 97.8 F (36.6 C) (Oral)   Ht 5' 6 (1.676 m)   Wt 221 lb (100.2 kg)   SpO2 (!) 84%   BMI 35.67 kg/m   Wt Readings from Last 3 Encounters:   04/23/24 221 lb (100.2 kg)  04/23/24 221 lb (100.2 kg)  03/28/24 236 lb (107 kg)    Physical Exam Vitals and nursing note reviewed.  Constitutional:      General: He is not in acute distress.    Appearance: Normal appearance. He is obese. He is not ill-appearing, toxic-appearing or diaphoretic.  HENT:     Head: Normocephalic and atraumatic.     Right Ear: External ear normal.     Left Ear: External ear normal.     Nose: Nose normal.     Mouth/Throat:     Mouth: Mucous membranes are moist.     Pharynx: Oropharynx is clear.  Eyes:     General: No scleral icterus.       Right eye: No discharge.        Left eye: No discharge.     Extraocular Movements: Extraocular movements intact.     Conjunctiva/sclera: Conjunctivae normal.     Pupils: Pupils are equal, round, and reactive to light.  Cardiovascular:     Rate and Rhythm: Normal rate and regular rhythm.     Pulses: Normal pulses.     Heart sounds: Normal heart sounds. No murmur heard.    No friction rub. No gallop.  Pulmonary:     Effort: Pulmonary effort is normal. No respiratory distress.     Breath sounds: Normal breath sounds. No stridor. No wheezing, rhonchi or rales.  Chest:     Chest wall: No tenderness.  Musculoskeletal:        General: Normal range of motion.     Cervical back: Normal range of motion and neck supple.  Skin:    General: Skin is warm and dry.     Capillary Refill: Capillary refill takes less than 2 seconds.     Coloration: Skin is not jaundiced or pale.     Findings: No bruising, erythema, lesion or rash.  Neurological:     General: No focal deficit present.     Mental Status: He is alert and oriented to person, place, and time. Mental status is at baseline.  Psychiatric:        Mood and Affect: Mood normal.        Behavior: Behavior normal.        Thought Content: Thought content normal.        Judgment: Judgment normal.     Results for orders placed or performed in visit on 04/23/24   CoaguChek XS/INR Waived   Collection Time: 04/23/24 11:08 AM  Result Value Ref Range   INR 1.3 (H) 0.9 - 1.1   Prothrombin Time 15.8 sec   *Note: Due to a large number of results and/or encounters for the requested time period, some results have not been displayed. A complete set of results can be found in Results Review.      Assessment & Plan:   Problem List Items Addressed This Visit       Cardiovascular and Mediastinum   Hypertension associated with diabetes (HCC)   BP continues to run very low. BP improved after 2 bottles of water  in the office. Continue midodrine  and continue to monitor closely. Labs drawn today. Await results.       Relevant Orders   CBC with Differential/Platelet   Comprehensive metabolic panel with GFR   Chronic HFrEF (heart failure with reduced ejection fraction) (HCC)   Continue to follow with heart failure clinic. Significant concern that worsening heart failure is causing issues with vomiting. Euvolemic today.       Relevant Orders   CBC with Differential/Platelet   Comprehensive metabolic panel with GFR   Magnesium    Hypotension - Primary   BP continues to run very low. BP improved after 2 bottles of water  in the office. Continue midodrine  and continue to monitor closely. Labs drawn today. Await results.         Respiratory   COPD (chronic obstructive pulmonary disease) (HCC)   Pulmonology did not think that his COPD was severe enough to cause his SOB. Concern it is due to CHF.         Other   H/O mechanical aortic valve replacement   Doing well on lovenox  right now. Will continue lovenox  until he is able to eat regularly again as we have not been able to effectively control his INR with coumadin . Call with any concerns. Recheck in about 2 weeks.       Relevant Orders   CoaguChek XS/INR Waived (Completed)   CBC with Differential/Platelet   Comprehensive metabolic panel with GFR   Unintentional weight loss   Continues to not be able  to eat without vomiting. Will get him back into see GI as he's not doing any better. Has lost an additional 15lbs in  the last month. We will start him on zofran  prior to eating to see if we can help control vomiting and increase intake. Continue to monitor closely- recheck 2 weeks.         Follow up plan: Return in about 2 weeks (around 05/07/2024).   >35 minutes spent with patient today

## 2024-04-23 NOTE — Assessment & Plan Note (Signed)
 Pulmonology did not think that his COPD was severe enough to cause his SOB. Concern it is due to CHF.

## 2024-04-24 ENCOUNTER — Ambulatory Visit: Payer: Self-pay | Admitting: Family Medicine

## 2024-04-24 LAB — CBC WITH DIFFERENTIAL/PLATELET
Basophils Absolute: 0.1 x10E3/uL (ref 0.0–0.2)
Basos: 1 %
EOS (ABSOLUTE): 0.2 x10E3/uL (ref 0.0–0.4)
Eos: 2 %
Hematocrit: 45 % (ref 37.5–51.0)
Hemoglobin: 14.4 g/dL (ref 13.0–17.7)
Immature Grans (Abs): 0.1 x10E3/uL (ref 0.0–0.1)
Immature Granulocytes: 1 %
Lymphocytes Absolute: 1.2 x10E3/uL (ref 0.7–3.1)
Lymphs: 14 %
MCH: 29.1 pg (ref 26.6–33.0)
MCHC: 32 g/dL (ref 31.5–35.7)
MCV: 91 fL (ref 79–97)
Monocytes Absolute: 0.8 x10E3/uL (ref 0.1–0.9)
Monocytes: 9 %
Neutrophils Absolute: 6.4 x10E3/uL (ref 1.4–7.0)
Neutrophils: 73 %
Platelets: 285 x10E3/uL (ref 150–450)
RBC: 4.94 x10E6/uL (ref 4.14–5.80)
RDW: 15 % (ref 11.6–15.4)
WBC: 8.6 x10E3/uL (ref 3.4–10.8)

## 2024-04-24 LAB — MAGNESIUM: Magnesium: 1.8 mg/dL (ref 1.6–2.3)

## 2024-04-24 LAB — COMPREHENSIVE METABOLIC PANEL WITH GFR
ALT: 51 IU/L — ABNORMAL HIGH (ref 0–44)
AST: 55 IU/L — ABNORMAL HIGH (ref 0–40)
Albumin: 3.9 g/dL (ref 3.9–4.9)
Alkaline Phosphatase: 61 IU/L (ref 47–123)
BUN/Creatinine Ratio: 10 (ref 10–24)
BUN: 20 mg/dL (ref 8–27)
Bilirubin Total: 0.5 mg/dL (ref 0.0–1.2)
CO2: 26 mmol/L (ref 20–29)
Calcium: 9.1 mg/dL (ref 8.6–10.2)
Chloride: 99 mmol/L (ref 96–106)
Creatinine, Ser: 1.99 mg/dL — ABNORMAL HIGH (ref 0.76–1.27)
Globulin, Total: 2.7 g/dL (ref 1.5–4.5)
Glucose: 108 mg/dL — ABNORMAL HIGH (ref 70–99)
Potassium: 3.1 mmol/L — ABNORMAL LOW (ref 3.5–5.2)
Sodium: 140 mmol/L (ref 134–144)
Total Protein: 6.6 g/dL (ref 6.0–8.5)
eGFR: 36 mL/min/1.73 — ABNORMAL LOW (ref >59)

## 2024-04-24 MED ORDER — POTASSIUM CHLORIDE ER 10 MEQ PO CPCR: 10.0000 meq | ORAL_CAPSULE | Freq: Every day | ORAL | 0 refills | Status: DC

## 2024-04-24 NOTE — Progress Notes (Unsigned)
 04/25/2024 Roberto [name] [id number] 1956-01-25  Referring provider: [name], DO Primary GI doctor: Dr. Suzann  ASSESSMENT AND PLAN:  Dysphagia and epigastric discomfort with regurgitation 03/05/2024 CT AP W0 fatty liver possibility of hemochromatosis which was negative mild to moderate cardiomegaly dependent density in the gallbladder and distinct may be sludge or gallstones bilateral foraminal impingement 5 S1 pain clustered tree-in-bud nodularity right lower lobe 03/2019 H. pylori stool negative RUQ US  03/07/2024 hepatic steatosis lobular appearance of right kidney 03/26/2024 UGI moderate esophageal dysmotility 04/2024 negative H pylori has been off of ozempic  x 6 weeks On pantoprazole  40 mg daily was not helping,  Likely dysmotility/regurg, possible GERD/GLP induced, no masses, no abnormality on UGI - started on baclofen  at night and has had some help, will try to do twice a day baclofen  before meals, consider buspar/CCB? -increase to twice a day pantoprazole  for 1 month then back to once a day and zofran  4 mg before food with help, would - consider empiric treatment for yeast but no yeast in mouth, no odynophagia - high risk for EGD, no plan for endoscopic evaluation at this time  Hepatic steatosis with elevated LFTs Hepatocellular workup negative 03/2024, ANA elevated.  Needs hepatitis B immunity    Latest Ref Rng & Units 04/23/2024   11:09 AM 03/17/2024   11:04 AM 03/09/2024    2:20 AM  Hepatic Function  Total Protein 6.0 - 8.5 g/dL 6.6  6.5  6.3   Albumin 3.9 - 4.9 g/dL 3.9  3.8  3.0   AST 0 - 40 IU/L 55  37  56   ALT 0 - 44 IU/L 51  44  51   Alk Phosphatase 47 - 123 IU/L 61  114  46   Total Bilirubin 0.0 - 1.2 mg/dL 0.5  0.5  0.6    Platelets 285  INR 04/23/2024 1.3  - need LFTs and CBC monitored every 6 months, - revaluation with imaging every 2-3 years.  -Continue to work on risk factor modification including diet exercise and control of risk factors  including blood sugars.  Family history of colon or rectal cancer in 2 first-degree relatives -mother and father Next surveillance colonoscopy due 12/20/2028 Pending health  NICM 09/2023 EF 25 to 30%  Valvular disease status post AVR/A-fib status post cardioversion 08/28/2023 V. tach status post AICD pacemaker On lovenox   CAD  CKD stage III  COPD  I have reviewed the clinic note as outlined by Alan Coombs, PA and agree with the assessment, plan and medical decision making.  [name] returns to the office today for follow-up of epigastric abdominal discomfort, GERD and dysphagia.  Barium esophagram performed after his last clinic visit showed esophageal dysmotility but no evidence of stricture, hiatal hernia or delayed gastric emptying.  He was started on pantoprazole  40 mg orally daily.  Noted that he was started on baclofen  6 weeks ago and has started to see some improvement in symptoms.  Reasonable to dose optimize baclofen  to see if this ameliorates esophageal function.  Reasonable to increase pantoprazole  to twice daily dosing and use ondansetron  as needed.  Inocente Suzann, MD   Patient Care Team: [name], DO as PCP - General (Family Medicine) End, [name], MD as PCP - Cardiology (Cardiology) Shlomo Wilbert SAUNDERS, MD as PCP - Sleep Medicine (Cardiology) Waddell Danelle ORN, MD as PCP - Electrophysiology (Cardiology) Marcelino Gales, MD (Nephrology) McGreal, Inocente HERO, MD as Consulting Physician (Gastroenterology) Lucas Dorise POUR, MD as Consulting Physician (Cardiothoracic  Surgery) Willma Camelia CROME, RN (Inactive) as [facility], [place], [place] as [facility] Care Management Karoline Lima, RN as [facility] Care Management Karoline Lima, RN as Case Manager  HISTORY OF PRESENT ILLNESS: 68 y.o. male with a past medical history listed below presents for evaluation of regurgitation.   03/24/2024 Dr. Suzann office visit for dysphagia and persistent dry  heaves  Discussed the use of AI scribe software for clinical note transcription with the patient, who gave verbal consent to proceed.  History of Present Illness   Roberto [name] is a 68 year old male with esophageal dysmotility who presents with difficulty swallowing and weight loss.  He has been experiencing trouble swallowing and dry heaves, which led to a consultation on September 22nd. A prior CT scan revealed a fatty liver and an enlarged heart, with some gallbladder sludge but no ductal dilation. An ultrasound confirmed the fatty liver but showed no gallstones or bowel wall thickening. An upper GI series indicated moderate esophageal dysmotility with delayed passage of contrast, and the stomach appeared normal.  He has been on pantoprazole  40 mg once daily, which was started previously. Despite this, he continued to experience difficulty eating and weight loss. Recently, he has been able to eat more, consuming three eggs over easy yesterday and again today, which he managed to keep down. He has also been able to eat broccoli with cheese and cheddar broccoli soup. He takes ondansetron  (Zofran ) 30 minutes before meals, which has helped him maintain food intake.  He was previously on Ozempic  for nearly two years but stopped it about six weeks ago, as it was suspected to contribute to his esophageal dysmotility. Since discontinuing Ozempic , he reports a decrease in burping and belching, which were previously malodorous.  He is also on baclofen  10 mg at bedtime, started approximately six weeks ago, which may aid in esophageal function. He has been prescribed a potassium supplement due to low potassium levels, and he is in stage three kidney failure.  No nausea, painful swallowing, chest pain, or blood in the stool. He reports an occasional burning sensation towards the end of eating, but no dark or black stools. Cold foods like frosties, slurpees, and smoothies are tolerated well, even without  medication.     He  reports that he has been smoking cigarettes. He started smoking about 51 years ago. He has a 23.8 pack-year smoking history. He has never used smokeless tobacco. He reports that he does not drink alcohol and does not use drugs.  RELEVANT GI HISTORY, IMAGING AND LABS: Results   RADIOLOGY Abdominal CT: Fatty liver, enlarged heart, gallbladder sludge or gallstones, no ductal dilation  Abdominal ultrasound: Fatty liver, no gallstones, no bowel wall thickening  DIAGNOSTIC Upper GI series: Moderate esophageal dysmotility, delayed passage of contrast, normal stomach appearance, no hiatal hernia, no reflux     RUQ ultrasound 03/07/2024 1. Nonspecific increased liver echotexture, compatible with intrinsic liver disease. This could correspond to the suspected hemochromatosis seen on prior CT exam. 2. Lobular appearance of the right kidney as above, likely reflecting significant right renal cortical scarring. Dedicated imaging of the right kidney may be useful to exclude underlying mass.     CTAP 03/05/2024 1. Accentuated density in the liver, parenchyma measuring at 106 Hounsfield units on this noncontrast exam, raising the possibility of hemochromatosis. 2. Faint clustered tree-in-bud nodularity in the right lower lobe, favoring atypical infectious bronchiolitis. 3. Mild to moderate cardiomegaly. 4. Dependent density in the gallbladder is indistinct and may  be from sludge or gallstones. 5. Bilateral foraminal impingement at L5-S1 due to facet arthropathy, intervertebral spurring, and a suspected nitrogen gas filled disc protrusion in the right neural foramen. 6.  Aortic Atherosclerosis (ICD10-I70.0).     Liver ultrasound with Doppler flow 11/03/2023 1. Technically limited study. 2. Fatty infiltration of the liver. 3. No evidence of portal vein thrombosis.   ECHO 09/27/2023   1. Left ventricular ejection fraction, by estimation, is 25 to 30%. Left  ventricular  ejection fraction by 3D volume is 28 %. The left ventricle has  severely decreased function. The left ventricle demonstrates global  hypokinesis. The left ventricular  internal cavity size was severely dilated. Left ventricular diastolic  parameters are consistent with Grade II diastolic dysfunction  (pseudonormalization). The average left ventricular global longitudinal  strain is -10.5 %. The global longitudinal strain  is abnormal.   2. Right ventricular systolic function is mildly reduced. The right  ventricular size is normal.   3. Left atrial size was mild to moderately dilated.   4. Right atrial size was moderately dilated.   5. The mitral valve is degenerative. Mild to moderate mitral valve  regurgitation. No evidence of mitral stenosis.   6. Tricuspid valve regurgitation is mild to moderate.   7. The aortic valve has been repaired/replaced. Aortic valve  regurgitation versus perivalvular leak is trivial. Mild to moderate aortic  valve stenosis with at least some component of patient-prosthesis  mismatch. Aortic valve mean gradient measures 12.6  mmHg.   8. There is mild dilatation of the aortic root, measuring 43 mm. Known  severe dilation of the ascending aorta is not well-visualized on this  examination.    GI Procedures and Studies  Colonoscopy 12/13/2023 2 polyps -lymphoid aggregate and hyperplastic polyp with prolapse changes CBC    Component Value Date/Time   WBC 8.6 04/23/2024 1109   WBC 11.7 (H) 03/19/2024 1600   RBC 4.94 04/23/2024 1109   RBC 5.15 03/19/2024 1600   HGB 14.4 04/23/2024 1109   HCT 45.0 04/23/2024 1109   PLT 285 04/23/2024 1109   MCV 91 04/23/2024 1109   MCV 88 05/25/2014 0556   MCH 29.1 04/23/2024 1109   MCH 28.7 03/19/2024 1600   MCHC 32.0 04/23/2024 1109   MCHC 32.5 03/19/2024 1600   RDW 15.0 04/23/2024 1109   RDW 13.9 05/25/2014 0556   LYMPHSABS 1.2 04/23/2024 1109   LYMPHSABS 2.0 05/25/2014 0556   MONOABS 0.7 06/17/2023 2110    MONOABS 1.0 05/25/2014 0556   EOSABS 0.2 04/23/2024 1109   EOSABS 0.3 05/25/2014 0556   BASOSABS 0.1 04/23/2024 1109   BASOSABS 0.1 05/25/2014 0556   Recent Labs    02/08/24 1732 02/25/24 0852 03/04/24 1552 03/07/24 1213 03/08/24 1208 03/09/24 0220 03/17/24 1104 03/19/24 1600 03/21/24 1321 04/23/24 1109  HGB 14.3 15.4 15.8 15.5 14.7 13.5 14.0 14.8 13.9  13.9 14.4    CMP     Component Value Date/Time   NA 140 04/23/2024 1109   NA 140 05/25/2014 0556   K 3.1 (L) 04/23/2024 1109   K 3.6 05/25/2014 0556   CL 99 04/23/2024 1109   CL 106 05/25/2014 0556   CO2 26 04/23/2024 1109   CO2 28 05/25/2014 0556   GLUCOSE 108 (H) 04/23/2024 1109   GLUCOSE 104 (H) 03/19/2024 1600   GLUCOSE 101 (H) 05/25/2014 0556   BUN 20 04/23/2024 1109   BUN 15 05/25/2014 0556   CREATININE 1.99 (H) 04/23/2024 1109   CREATININE 0.85 05/25/2014 0556  CALCIUM  9.1 04/23/2024 1109   CALCIUM  8.5 05/25/2014 0556   PROT 6.6 04/23/2024 1109   PROT 8.1 05/24/2014 1157   ALBUMIN 3.9 04/23/2024 1109   ALBUMIN 3.0 (L) 05/24/2014 1157   AST 55 (H) 04/23/2024 1109   AST 31 05/17/2017 1311   AST 22 05/24/2014 1157   ALT 51 (H) 04/23/2024 1109   ALT 47 05/17/2017 1311   ALT 20 05/24/2014 1157   ALKPHOS 61 04/23/2024 1109   ALKPHOS 69 05/24/2014 1157   BILITOT 0.5 04/23/2024 1109   BILITOT 0.5 05/24/2014 1157   GFRNONAA 33 (L) 03/19/2024 1600   GFRNONAA >60 05/25/2014 0556   GFRAA 64 05/12/2020 1350   GFRAA >60 05/25/2014 0556      Latest Ref Rng & Units 04/23/2024   11:09 AM 03/17/2024   11:04 AM 03/09/2024    2:20 AM  Hepatic Function  Total Protein 6.0 - 8.5 g/dL 6.6  6.5  6.3   Albumin 3.9 - 4.9 g/dL 3.9  3.8  3.0   AST 0 - 40 IU/L 55  37  56   ALT 0 - 44 IU/L 51  44  51   Alk Phosphatase 47 - 123 IU/L 61  114  46   Total Bilirubin 0.0 - 1.2 mg/dL 0.5  0.5  0.6       Current Medications:   Current Outpatient Medications (Endocrine & Metabolic):    dapagliflozin  propanediol (FARXIGA ) 10  MG TABS tablet, Take 1 tablet (10 mg total) by mouth daily before breakfast.   metFORMIN  (GLUCOPHAGE -XR) 500 MG 24 hr tablet, Take 1 tablet by mouth once daily with breakfast   Semaglutide , 1 MG/DOSE, (OZEMPIC , 1 MG/DOSE,) 2 MG/1.5ML SOPN, Inject 1 mg into the skin once a week. (Patient not taking: Reported on 04/25/2024)   Current Outpatient Medications (Cardiovascular):    amiodarone  (PACERONE ) 200 MG tablet, Take 2 tablets by mouth twice daily   fenofibrate  (TRICOR ) 48 MG tablet, Take 48 mg by mouth daily.   losartan  (COZAAR ) 25 MG tablet, Take 25 mg by mouth daily.   metoprolol  succinate (TOPROL -XL) 50 MG 24 hr tablet, Take 1 tablet (50 mg total) by mouth daily.   mexiletine (MEXITIL ) 150 MG capsule, Take 150 mg by mouth 3 (three) times daily.   midodrine  (PROAMATINE ) 2.5 MG tablet, Take 1 tablet (2.5 mg total) by mouth 2 (two) times daily with a meal.   rosuvastatin  (CRESTOR ) 40 MG tablet, Take 1 tablet (40 mg total) by mouth daily.   torsemide  (DEMADEX ) 20 MG tablet, Take 1 tablet (20 mg total) by mouth daily.   Current Outpatient Medications (Respiratory):    albuterol  (PROVENTIL ) (2.5 MG/3ML) 0.083% nebulizer solution, USE 1 VIAL IN NEBULIZER EVERY 6 HOURS AS NEEDED FOR WHEEZING FOR SHORTNESS OF BREATH   Fluticasone -Umeclidin-Vilant (TRELEGY ELLIPTA ) 200-62.5-25 MCG/ACT AEPB, Inhale 1 puff into the lungs daily in the afternoon.   VENTOLIN  HFA 108 (90 Base) MCG/ACT inhaler, INHALE 2 PUFFS BY MOUTH EVERY 6 HOURS AS NEEDED FOR WHEEZING OR SHORTNESS OF BREATH  Current Facility-Administered Medications (Respiratory):    albuterol  (PROVENTIL ) (2.5 MG/3ML) 0.083% nebulizer solution 2.5 mg  Current Outpatient Medications (Analgesics):    allopurinol  (ZYLOPRIM ) 100 MG tablet, Take 1 tablet (100 mg total) by mouth daily.   Current Outpatient Medications (Hematological):    enoxaparin  (LOVENOX ) 120 MG/0.8ML injection, INJECT 0.72 ML (108MG ) INTO THE SKIN EVERY 12 HOURS   Current  Outpatient Medications (Other):    buPROPion  (WELLBUTRIN  XL) 300 MG 24 hr tablet, Take  300 mg by mouth daily.   nortriptyline  (PAMELOR ) 10 MG capsule, Take 1 capsule by mouth at bedtime   Omega-3 Fatty Acids (OMEGA 3 PO), Take 1,500 mg by mouth 2 (two) times daily.   potassium chloride  (MICRO-K ) 10 MEQ CR capsule, Take 1 capsule (10 mEq total) by mouth daily.   Spacer/Aero-Holding Raguel FRENCH, Use as directed Dx: COPD, J44.9   traZODone  (DESYREL ) 50 MG tablet, Take 0.5 tablets (25 mg total) by mouth at bedtime as needed for sleep.   baclofen  (LIORESAL ) 10 MG tablet, Take 1 tablet (10 mg total) by mouth 2 (two) times daily. Take before food twice a day, stop if not helping   ondansetron  (ZOFRAN -ODT) 4 MG disintegrating tablet, Take 1 tablet (4 mg total) by mouth every 8 (eight) hours as needed for nausea or vomiting (before eating).   pantoprazole  (PROTONIX ) 40 MG tablet, Take 1 tablet (40 mg total) by mouth 2 (two) times daily. Then go back to once a day   Medical History:  Past Medical History:  Diagnosis Date   Aneurysm    Bicuspid aortic valve    a. s/p #27 Carbomedics mechanical valve on 03/25/2010; b. on Coumadin ; c. TTE 12/17: EF 40-45%, moderately dilated LV with moderate LVH, AVR well-seated with 14 mmHg gradient, peak AV velocity 2.5 m/s, mild mitral valve thickening with mild MR, mildly dilated RV with mildly reduced contraction   Cellulitis    CHF (congestive heart failure) (HCC)    Chronic kidney disease    Chronic systolic CHF (congestive heart failure) (HCC)    a. R/LHC 03/2010 showed no significant CAD, LVEDP 31 mmHg, mean AoV gradient 34 mmHg at rest and 47 mmHg with dobutamine 20 mcg/kg/min, AVA 1.0 cm^2, RA 31, RV 68/25, PA 68/47, PCWP 38. PA sat 65%. CO 6.2 L/min (Fick) and 5.3 L/min (thermodilution)   Clotting disorder    COPD (chronic obstructive pulmonary disease) (HCC)    H/O mechanical aortic valve replacement 03/25/2010   a. #27 Carbomedics mechanical valve    Hearing loss    High cholesterol    HTN (hypertension)    Hypercholesterolemia    Renal infarct 2017   Multiple right renal infarcts, likely embolic.   Stage 3 chronic kidney disease (HCC)    Stroke (HCC)    TIA (transient ischemic attack) 05/2014   Allergies:  Allergies  Allergen Reactions   Nicotine  Hives and Rash    Patches caused localized rash and hives      Surgical History:  He  has a past surgical history that includes Aortic valve replacement; Carpal tunnel release (Left, 2005); Tonsillectomy (1962); Cardiac surgery (2009); Cardiac catheterization (03/21/2010); RIGHT HEART CATH AND CORONARY ANGIOGRAPHY (N/A, 01/28/2019); RIGHT HEART CATH AND CORONARY ANGIOGRAPHY (N/A, 06/20/2023); ICD IMPLANT (N/A, 06/25/2023); PACEMAKER IMPLANT; Aortic valve surgery; CARDIOVERSION (N/A, 08/28/2023); Colonoscopy (N/A, 12/13/2023); Polypectomy (12/13/2023); and RIGHT HEART CATH (Right, 03/21/2024). Family History:  His family history includes Arthritis in his father and mother; Breast cancer in his sister; Cancer in his brother; Colon cancer in his father and mother; Dementia in his mother; Diabetes in his father; Heart attack in his brother and brother; Heart disease in his brother; Seizures in his sister; Stroke in his father.  REVIEW OF SYSTEMS  : All other systems reviewed and negative except where noted in the History of Present Illness.  PHYSICAL EXAM: BP 122/74   Pulse 84   Ht 5' 6 (1.676 m)   Wt 224 lb 8 oz (101.8 kg)   BMI 36.24  kg/m  Physical Exam   GENERAL APPEARANCE: Chronically ill appearing in wheelchair, in no apparent distress. HEENT: No cervical lymphadenopathy, unremarkable thyroid , sclerae anicteric, conjunctiva pink. RESPIRATORY: Respiratory effort normal, diffusely decreased CARDIO: Regular rate and rhythm with systolic murmur ABDOMEN: Soft, non-distended, obese, active bowel sounds in all four quadrants, non-tender to palpation, no rebound, no mass appreciated. RECTAL:  Declines. MUSCULOSKELETAL: gait not tested, in wheelchair SKIN: Dry, intact without rashes or lesions. No jaundice. NEURO: Alert, oriented, no focal deficits. PSYCH: Cooperative, normal mood and affect.      Alan JONELLE Coombs, [name] 11:39 AM

## 2024-04-25 ENCOUNTER — Encounter: Payer: Self-pay | Admitting: Physician Assistant

## 2024-04-25 ENCOUNTER — Ambulatory Visit (INDEPENDENT_AMBULATORY_CARE_PROVIDER_SITE_OTHER): Admitting: Physician Assistant

## 2024-04-25 VITALS — BP 122/74 | HR 84 | Ht 66.0 in | Wt 224.5 lb

## 2024-04-25 DIAGNOSIS — R748 Abnormal levels of other serum enzymes: Secondary | ICD-10-CM | POA: Diagnosis not present

## 2024-04-25 DIAGNOSIS — I4891 Unspecified atrial fibrillation: Secondary | ICD-10-CM | POA: Diagnosis not present

## 2024-04-25 DIAGNOSIS — I428 Other cardiomyopathies: Secondary | ICD-10-CM | POA: Diagnosis not present

## 2024-04-25 DIAGNOSIS — K224 Dyskinesia of esophagus: Secondary | ICD-10-CM

## 2024-04-25 DIAGNOSIS — R1110 Vomiting, unspecified: Secondary | ICD-10-CM

## 2024-04-25 DIAGNOSIS — I2510 Atherosclerotic heart disease of native coronary artery without angina pectoris: Secondary | ICD-10-CM | POA: Diagnosis not present

## 2024-04-25 DIAGNOSIS — J432 Centrilobular emphysema: Secondary | ICD-10-CM | POA: Diagnosis not present

## 2024-04-25 DIAGNOSIS — I38 Endocarditis, valve unspecified: Secondary | ICD-10-CM

## 2024-04-25 DIAGNOSIS — R1310 Dysphagia, unspecified: Secondary | ICD-10-CM | POA: Diagnosis not present

## 2024-04-25 MED ORDER — ONDANSETRON 4 MG PO TBDP: 4.0000 mg | ORAL_TABLET | Freq: Three times a day (TID) | ORAL | 1 refills | Status: DC | PRN

## 2024-04-25 MED ORDER — BACLOFEN 10 MG PO TABS: 10.0000 mg | ORAL_TABLET | Freq: Two times a day (BID) | ORAL | 1 refills | Status: DC

## 2024-04-25 MED ORDER — PANTOPRAZOLE SODIUM 40 MG PO TBEC: 40.0000 mg | DELAYED_RELEASE_TABLET | Freq: Two times a day (BID) | ORAL | 0 refills | Status: DC

## 2024-04-25 NOTE — Patient Instructions (Addendum)
 Behavioral and Dietary Strategies for Management of Esophageal Dysmotility/dysphagia 1. Take reflux medications 30+ minutes before food in the morning.  2. Begin meals with warm beverage 3. Eat smaller more frequent meals 4. Eat slowly, taking small bites and sips 5. Alternate solids and liquids 6. Avoid foods/liquids that increase acid production 7. Sit upright during and for 30+ minutes after meals to facilitate esophageal clearing 8. Can try altoid melting in mouth before food  VISIT SUMMARY:  Today, we discussed your ongoing issues with difficulty swallowing and weight loss. We reviewed your recent imaging and test results, and adjusted your medications to help manage your symptoms more effectively.  YOUR PLAN:  ESOPHAGEAL DYSMOTILITY: You have moderate esophageal dysmotility, which means your esophagus has trouble moving food down to your stomach. This has caused difficulty swallowing and weight loss. -Increase pantoprazole  to 40 mg twice daily for one month, then reduce to once daily. -Adjust baclofen  dosing to twice daily, particularly before meals. -Avoid endoscopy due to high risk and lack of anatomical findings.  HYPOKALEMIA: You have low potassium levels, which need to be monitored closely because of your stage 3 kidney failure. -Continue taking your potassium supplement as prescribed.  I appreciate the  opportunity to care for you  Thank You   [facility]

## 2024-04-26 DIAGNOSIS — J449 Chronic obstructive pulmonary disease, unspecified: Secondary | ICD-10-CM | POA: Diagnosis not present

## 2024-04-28 ENCOUNTER — Ambulatory Visit: Admitting: Family Medicine

## 2024-04-30 ENCOUNTER — Observation Stay
Admission: EM | Admit: 2024-04-30 | Discharge: 2024-05-02 | Disposition: A | Discharge status: Home / Self Care | Attending: Internal Medicine | Admitting: Internal Medicine

## 2024-04-30 ENCOUNTER — Emergency Department

## 2024-04-30 ENCOUNTER — Other Ambulatory Visit: Payer: Self-pay

## 2024-04-30 DIAGNOSIS — D649 Anemia, unspecified: Secondary | ICD-10-CM

## 2024-04-30 DIAGNOSIS — E1122 Type 2 diabetes mellitus with diabetic chronic kidney disease: Secondary | ICD-10-CM | POA: Insufficient documentation

## 2024-04-30 DIAGNOSIS — E876 Hypokalemia: Secondary | ICD-10-CM | POA: Diagnosis not present

## 2024-04-30 DIAGNOSIS — D62 Acute posthemorrhagic anemia: Principal | ICD-10-CM

## 2024-04-30 DIAGNOSIS — N183 Chronic kidney disease, stage 3 unspecified: Secondary | ICD-10-CM | POA: Diagnosis not present

## 2024-04-30 DIAGNOSIS — G9340 Encephalopathy, unspecified: Secondary | ICD-10-CM | POA: Diagnosis present

## 2024-04-30 DIAGNOSIS — S0990XA Unspecified injury of head, initial encounter: Secondary | ICD-10-CM | POA: Diagnosis not present

## 2024-04-30 DIAGNOSIS — I480 Paroxysmal atrial fibrillation: Secondary | ICD-10-CM | POA: Insufficient documentation

## 2024-04-30 DIAGNOSIS — Z91040 Latex allergy status: Secondary | ICD-10-CM | POA: Diagnosis not present

## 2024-04-30 DIAGNOSIS — I130 Hypertensive heart and chronic kidney disease with heart failure and stage 1 through stage 4 chronic kidney disease, or unspecified chronic kidney disease: Secondary | ICD-10-CM | POA: Diagnosis not present

## 2024-04-30 DIAGNOSIS — R4182 Altered mental status, unspecified: Secondary | ICD-10-CM | POA: Diagnosis present

## 2024-04-30 DIAGNOSIS — N1832 Chronic kidney disease, stage 3b: Secondary | ICD-10-CM | POA: Insufficient documentation

## 2024-04-30 DIAGNOSIS — I5042 Chronic combined systolic (congestive) and diastolic (congestive) heart failure: Secondary | ICD-10-CM | POA: Diagnosis not present

## 2024-04-30 DIAGNOSIS — Z952 Presence of prosthetic heart valve: Secondary | ICD-10-CM

## 2024-04-30 DIAGNOSIS — I4729 Other ventricular tachycardia: Secondary | ICD-10-CM

## 2024-04-30 DIAGNOSIS — Z959 Presence of cardiac and vascular implant and graft, unspecified: Secondary | ICD-10-CM | POA: Diagnosis not present

## 2024-04-30 DIAGNOSIS — Z79899 Other long term (current) drug therapy: Secondary | ICD-10-CM | POA: Insufficient documentation

## 2024-04-30 DIAGNOSIS — R748 Abnormal levels of other serum enzymes: Secondary | ICD-10-CM | POA: Insufficient documentation

## 2024-04-30 DIAGNOSIS — S199XXA Unspecified injury of neck, initial encounter: Secondary | ICD-10-CM | POA: Diagnosis not present

## 2024-04-30 DIAGNOSIS — E785 Hyperlipidemia, unspecified: Secondary | ICD-10-CM | POA: Insufficient documentation

## 2024-04-30 DIAGNOSIS — G9341 Metabolic encephalopathy: Secondary | ICD-10-CM | POA: Diagnosis not present

## 2024-04-30 DIAGNOSIS — F32A Depression, unspecified: Secondary | ICD-10-CM | POA: Diagnosis not present

## 2024-04-30 DIAGNOSIS — I10 Essential (primary) hypertension: Secondary | ICD-10-CM | POA: Insufficient documentation

## 2024-04-30 DIAGNOSIS — M109 Gout, unspecified: Secondary | ICD-10-CM | POA: Insufficient documentation

## 2024-04-30 LAB — RETICULOCYTES
Immature Retic Fract: 17.4 % — ABNORMAL HIGH (ref 2.3–15.9)
RBC.: 3.55 MIL/uL — ABNORMAL LOW (ref 4.22–5.81)
Retic Count, Absolute: 67.5 K/uL (ref 19.0–186.0)
Retic Ct Pct: 1.9 % (ref 0.4–3.1)

## 2024-04-30 LAB — AMMONIA: Ammonia: <13 umol/L (ref 9–35)

## 2024-04-30 LAB — URINALYSIS, W/ REFLEX TO CULTURE (INFECTION SUSPECTED)
Bilirubin Urine: NEGATIVE
Glucose, UA: NEGATIVE mg/dL
Hgb urine dipstick: NEGATIVE
Ketones, ur: NEGATIVE mg/dL
Nitrite: NEGATIVE
Protein, ur: NEGATIVE mg/dL
Specific Gravity, Urine: 1.016 (ref 1.005–1.030)
pH: 5 (ref 5.0–8.0)

## 2024-04-30 LAB — COMPREHENSIVE METABOLIC PANEL WITH GFR
ALT: 45 U/L — ABNORMAL HIGH (ref 0–44)
AST: 50 U/L — ABNORMAL HIGH (ref 15–41)
Albumin: 3.1 g/dL — ABNORMAL LOW (ref 3.5–5.0)
Alkaline Phosphatase: 38 U/L (ref 38–126)
Anion gap: 12 (ref 5–15)
BUN: 25 mg/dL — ABNORMAL HIGH (ref 8–23)
CO2: 23 mmol/L (ref 22–32)
Calcium: 8.3 mg/dL — ABNORMAL LOW (ref 8.9–10.3)
Chloride: 103 mmol/L (ref 98–111)
Creatinine, Ser: 1.87 mg/dL — ABNORMAL HIGH (ref 0.61–1.24)
GFR, Estimated: 38 mL/min — ABNORMAL LOW (ref >60)
Glucose, Bld: 110 mg/dL — ABNORMAL HIGH (ref 70–99)
Potassium: 2.9 mmol/L — ABNORMAL LOW (ref 3.5–5.1)
Sodium: 138 mmol/L (ref 135–145)
Total Bilirubin: 0.8 mg/dL (ref 0.0–1.2)
Total Protein: 6 g/dL — ABNORMAL LOW (ref 6.5–8.1)

## 2024-04-30 LAB — CBC WITH DIFFERENTIAL/PLATELET
Abs Immature Granulocytes: 0.07 K/uL (ref 0.00–0.07)
Basophils Absolute: 0 K/uL (ref 0.0–0.1)
Basophils Relative: 0 %
Eosinophils Absolute: 0 K/uL (ref 0.0–0.5)
Eosinophils Relative: 0 %
HCT: 32.2 % — ABNORMAL LOW (ref 39.0–52.0)
Hemoglobin: 10.2 g/dL — ABNORMAL LOW (ref 13.0–17.0)
Immature Granulocytes: 1 %
Lymphocytes Relative: 6 %
Lymphs Abs: 0.6 K/uL — ABNORMAL LOW (ref 0.7–4.0)
MCH: 28.7 pg (ref 26.0–34.0)
MCHC: 31.7 g/dL (ref 30.0–36.0)
MCV: 90.7 fL (ref 80.0–100.0)
Monocytes Absolute: 0.9 K/uL (ref 0.1–1.0)
Monocytes Relative: 9 %
Neutro Abs: 9.2 K/uL — ABNORMAL HIGH (ref 1.7–7.7)
Neutrophils Relative %: 84 %
Platelets: 182 K/uL (ref 150–400)
RBC: 3.55 MIL/uL — ABNORMAL LOW (ref 4.22–5.81)
RDW: 16.1 % — ABNORMAL HIGH (ref 11.5–15.5)
WBC: 10.9 K/uL — ABNORMAL HIGH (ref 4.0–10.5)
nRBC: 0 % (ref 0.0–0.2)

## 2024-04-30 LAB — URINE DRUG SCREEN, QUALITATIVE (ARMC ONLY)
Amphetamines, Ur Screen: NOT DETECTED
Barbiturates, Ur Screen: NOT DETECTED
Benzodiazepine, Ur Scrn: NOT DETECTED
Cannabinoid 50 Ng, Ur ~~LOC~~: NOT DETECTED
Cocaine Metabolite,Ur ~~LOC~~: NOT DETECTED
MDMA (Ecstasy)Ur Screen: POSITIVE — AB
Methadone Scn, Ur: NOT DETECTED
Opiate, Ur Screen: NOT DETECTED
Phencyclidine (PCP) Ur S: NOT DETECTED
Tricyclic, Ur Screen: POSITIVE — AB

## 2024-04-30 LAB — TROPONIN I (HIGH SENSITIVITY)
Troponin I (High Sensitivity): 26 ng/L — ABNORMAL HIGH (ref <18)
Troponin I (High Sensitivity): 32 ng/L — ABNORMAL HIGH (ref <18)

## 2024-04-30 LAB — MAGNESIUM: Magnesium: 1.7 mg/dL (ref 1.7–2.4)

## 2024-04-30 LAB — APTT: aPTT: 34 s (ref 24–36)

## 2024-04-30 LAB — PROTIME-INR
INR: 1.2 (ref 0.8–1.2)
Prothrombin Time: 15.9 s — ABNORMAL HIGH (ref 11.4–15.2)

## 2024-04-30 MED ORDER — POTASSIUM CHLORIDE 10 MEQ/100ML IV SOLN
10.0000 meq | INTRAVENOUS | Status: DC
Start: 2024-04-30 — End: 2024-04-30
  Administered 2024-04-30: 10 meq via INTRAVENOUS
  Filled 2024-04-30 (×2): qty 100

## 2024-04-30 MED ORDER — ALLOPURINOL 100 MG PO TABS
100.0000 mg | ORAL_TABLET | Freq: Every day | ORAL | Status: DC
  Administered 2024-05-01 – 2024-05-02 (×2): 100 mg via ORAL
  Filled 2024-04-30 (×2): qty 1

## 2024-04-30 MED ORDER — OMEGA-3-ACID ETHYL ESTERS 1 G PO CAPS
1.0000 | ORAL_CAPSULE | Freq: Two times a day (BID) | ORAL | Status: DC
  Administered 2024-05-01 – 2024-05-02 (×3): 1 g via ORAL
  Filled 2024-04-30 (×3): qty 1

## 2024-04-30 MED ORDER — POTASSIUM CHLORIDE 10 MEQ/100ML IV SOLN
10.0000 meq | Freq: Once | INTRAVENOUS | Status: AC
  Administered 2024-04-30: 10 meq via INTRAVENOUS

## 2024-04-30 MED ORDER — MEXILETINE HCL 150 MG PO CAPS
150.0000 mg | ORAL_CAPSULE | Freq: Three times a day (TID) | ORAL | Status: DC
  Administered 2024-05-01 – 2024-05-02 (×5): 150 mg via ORAL
  Filled 2024-04-30 (×7): qty 1

## 2024-04-30 MED ORDER — MAGNESIUM SULFATE 2 GM/50ML IV SOLN
2.0000 g | Freq: Once | INTRAVENOUS | Status: AC
  Administered 2024-04-30: 2 g via INTRAVENOUS
  Filled 2024-04-30: qty 50

## 2024-04-30 MED ORDER — ENOXAPARIN SODIUM 120 MG/0.8ML IJ SOSY
110.0000 mg | PREFILLED_SYRINGE | Freq: Two times a day (BID) | INTRAMUSCULAR | Status: DC
  Administered 2024-05-01 – 2024-05-02 (×4): 110 mg via SUBCUTANEOUS
  Filled 2024-04-30 (×5): qty 0.74

## 2024-04-30 MED ORDER — ALBUTEROL SULFATE (2.5 MG/3ML) 0.083% IN NEBU: 2.5000 mg | INHALATION_SOLUTION | Freq: Four times a day (QID) | RESPIRATORY_TRACT | Status: DC | PRN

## 2024-04-30 MED ORDER — BACLOFEN 10 MG PO TABS
10.0000 mg | ORAL_TABLET | Freq: Two times a day (BID) | ORAL | Status: DC
  Administered 2024-05-01 – 2024-05-02 (×3): 10 mg via ORAL
  Filled 2024-04-30 (×3): qty 1

## 2024-04-30 MED ORDER — POTASSIUM CHLORIDE IN NACL 20-0.9 MEQ/L-% IV SOLN
INTRAVENOUS | Status: DC
  Filled 2024-04-30 (×4): qty 1000

## 2024-04-30 MED ORDER — MIDODRINE HCL 5 MG PO TABS
2.5000 mg | ORAL_TABLET | Freq: Two times a day (BID) | ORAL | Status: DC
  Administered 2024-05-01: 2.5 mg via ORAL
  Filled 2024-04-30: qty 1

## 2024-04-30 MED ORDER — ONDANSETRON HCL 4 MG/2ML IJ SOLN: 4.0000 mg | Freq: Four times a day (QID) | INTRAMUSCULAR | Status: DC | PRN

## 2024-04-30 MED ORDER — MAGNESIUM HYDROXIDE 400 MG/5ML PO SUSP: 30.0000 mL | Freq: Every day | ORAL | Status: DC | PRN

## 2024-04-30 MED ORDER — ROSUVASTATIN CALCIUM 10 MG PO TABS
40.0000 mg | ORAL_TABLET | Freq: Every day | ORAL | Status: DC
  Administered 2024-05-01 – 2024-05-02 (×2): 40 mg via ORAL
  Filled 2024-04-30 (×2): qty 4

## 2024-04-30 MED ORDER — FENOFIBRATE 54 MG PO TABS
54.0000 mg | ORAL_TABLET | Freq: Every day | ORAL | Status: DC
  Administered 2024-05-01 – 2024-05-02 (×2): 54 mg via ORAL
  Filled 2024-04-30 (×2): qty 1

## 2024-04-30 MED ORDER — LOSARTAN POTASSIUM 25 MG PO TABS: 25.0000 mg | ORAL_TABLET | Freq: Every day | ORAL | Status: DC

## 2024-04-30 MED ORDER — TRAZODONE HCL 50 MG PO TABS: 25.0000 mg | ORAL_TABLET | Freq: Every evening | ORAL | Status: DC | PRN

## 2024-04-30 MED ORDER — NORTRIPTYLINE HCL 10 MG PO CAPS
10.0000 mg | ORAL_CAPSULE | Freq: Every day | ORAL | Status: DC
  Administered 2024-05-01 (×2): 10 mg via ORAL
  Filled 2024-04-30 (×3): qty 1

## 2024-04-30 MED ORDER — PANTOPRAZOLE SODIUM 40 MG PO TBEC
40.0000 mg | DELAYED_RELEASE_TABLET | Freq: Two times a day (BID) | ORAL | Status: DC
  Administered 2024-05-01 – 2024-05-02 (×4): 40 mg via ORAL
  Filled 2024-04-30 (×4): qty 1

## 2024-04-30 MED ORDER — AMIODARONE HCL 200 MG PO TABS
400.0000 mg | ORAL_TABLET | Freq: Two times a day (BID) | ORAL | Status: DC
  Administered 2024-05-01 – 2024-05-02 (×4): 400 mg via ORAL
  Filled 2024-04-30 (×4): qty 2

## 2024-04-30 MED ORDER — DAPAGLIFLOZIN PROPANEDIOL 10 MG PO TABS
10.0000 mg | ORAL_TABLET | Freq: Every day | ORAL | Status: DC
  Administered 2024-05-01 – 2024-05-02 (×2): 10 mg via ORAL
  Filled 2024-04-30 (×2): qty 1

## 2024-04-30 MED ORDER — ONDANSETRON HCL 4 MG PO TABS: 4.0000 mg | ORAL_TABLET | Freq: Four times a day (QID) | ORAL | Status: DC | PRN

## 2024-04-30 MED ORDER — POTASSIUM CHLORIDE CRYS ER 20 MEQ PO TBCR: 20.0000 meq | EXTENDED_RELEASE_TABLET | Freq: Every day | ORAL | Status: DC

## 2024-04-30 MED ORDER — TORSEMIDE 20 MG PO TABS: 20.0000 mg | ORAL_TABLET | Freq: Every day | ORAL | Status: DC

## 2024-04-30 MED ORDER — ACETAMINOPHEN 650 MG RE SUPP: 650.0000 mg | Freq: Four times a day (QID) | RECTAL | Status: DC | PRN

## 2024-04-30 MED ORDER — METOPROLOL SUCCINATE ER 50 MG PO TB24: 50.0000 mg | ORAL_TABLET | Freq: Every day | ORAL | Status: DC

## 2024-04-30 MED ORDER — BUPROPION HCL ER (XL) 150 MG PO TB24
300.0000 mg | ORAL_TABLET | Freq: Every day | ORAL | Status: DC
  Administered 2024-05-01 – 2024-05-02 (×2): 300 mg via ORAL
  Filled 2024-04-30 (×2): qty 2

## 2024-04-30 MED ORDER — ACETAMINOPHEN 325 MG PO TABS: 650.0000 mg | ORAL_TABLET | Freq: Four times a day (QID) | ORAL | Status: DC | PRN

## 2024-04-30 NOTE — H&P (Incomplete)
 [place]   PATIENT NAME: Roberto [name]    MR#:  [id number]  DATE OF BIRTH:  Jun 02, 1956  DATE OF ADMISSION:  04/30/2024  PRIMARY CARE PHYSICIAN: [name], DO   Patient is coming from: Home  REQUESTING/REFERRING PHYSICIAN: Levander Slate, MD  CHIEF COMPLAINT:   Chief Complaint  Patient presents with  . Altered Mental Status    HISTORY OF PRESENT ILLNESS:  Roberto [name] is a 68 y.o. male with medical history significant for ***  ED Course: *** EKG as reviewed by me : *** Imaging: *** PAST MEDICAL HISTORY:   Past Medical History:  Diagnosis Date  . Aneurysm   . Bicuspid aortic valve    a. s/p #27 [employer]omed[employer] mechanical valve on 03/25/2010; b. on Coumadin ; c. TTE 12/17: EF 40-45%, moderately dilated LV with moderate LVH, AVR well-seated with 14 mmHg gradient, peak AV velocity 2.5 m/s, mild mitral valve thickening with mild MR, mildly dilated RV with mildly reduced contraction  . Cellulitis   . CHF (congestive heart failure) (HCC)   . Chronic kidney disease   . Chronic systolic CHF (congestive heart failure) (HCC)    a. R/LHC 03/2010 showed no significant CAD, LVEDP 31 mmHg, mean AoV gradient 34 mmHg at rest and 47 mmHg with dobutamine 20 mcg/kg/min, AVA 1.0 cm^2, RA 31, RV 68/25, PA 68/47, PCWP 38. PA sat 65%. CO 6.2 L/min (Fick) and 5.3 L/min (thermodilution)  . Clotting disorder   . COPD (chronic obstructive pulmonary disease) (HCC)   . H/O mechanical aortic valve replacement 03/25/2010   a. #27 Carbomedics mechanical valve  . Hearing loss   . High cholesterol   . HTN (hypertension)   . Hypercholesterolemia   . Renal infarct 2017   Multiple right renal infarcts, likely embolic.  . Stage 3 chronic kidney disease (HCC)   . Stroke (HCC)   . TIA (transient ischemic attack) 05/2014    PAST SURGICAL HISTORY:   Past Surgical History:  Procedure Laterality Date  . AORTIC VALVE REPLACEMENT    . AORTIC VALVE SURGERY    . CARDIAC CATHETERIZATION   03/21/2010   No significant CAD. Severe aortic stenosis. Severely elevated left and right heart filling pressures.  SABRA CARDIAC SURGERY  2009   CHF  . CARDIOVERSION N/A 08/28/2023   Procedure: CARDIOVERSION;  Surgeon: [name] Slain, MD;  Location: [facility] INVASIVE CV LAB;  Service: Cardiovascular;  Laterality: N/A;  . CARPAL TUNNEL RELEASE Left 2005  . COLONOSCOPY N/A 12/13/2023   Procedure: COLONOSCOPY;  Surgeon: Suzann Inocente HERO, MD;  Location: [facility];  Service: Gastroenterology;  Laterality: N/A;  . ICD IMPLANT N/A 06/25/2023   Procedure: ICD IMPLANT;  Surgeon: Waddell Danelle ORN, MD;  Location: [facility] INVASIVE CV LAB;  Service: Cardiovascular;  Laterality: N/A;  . PACEMAKER IMPLANT    . POLYPECTOMY  12/13/2023   Procedure: POLYPECTOMY, INTESTINE;  Surgeon: Suzann Inocente HERO, MD;  Location: [facility];  Service: Gastroenterology;;  . RIGHT HEART CATH Right 03/21/2024   Procedure: RIGHT HEART CATH;  Surgeon: Rolan Ezra RAMAN, MD;  Location: [facility] INVASIVE CV LAB;  Service: Cardiovascular;  Laterality: Right;  . RIGHT HEART CATH AND CORONARY ANGIOGRAPHY N/A 01/28/2019   Procedure: RIGHT HEART CATH AND CORONARY ANGIOGRAPHY;  Surgeon: [name], MD;  Location: [facility] INVASIVE CV LAB;  Service: Cardiovascular;  Laterality: N/A;  . RIGHT HEART CATH AND CORONARY ANGIOGRAPHY N/A 06/20/2023   Procedure: RIGHT HEART CATH AND CORONARY ANGIOGRAPHY;  Surgeon: [name], MD;  Location: [facility]  INVASIVE CV LAB;  Service: Cardiovascular;  Laterality: N/A;  . TONSILLECTOMY  1962    SOCIAL HISTORY:   Social History   Tobacco Use  . Smoking status: Every Day    Current packs/day: 0.20    Average packs/day: 0.5 packs/day for 48.3 years (23.8 ttl pk-yrs)    Types: Cigarettes    Start date: 65    Last attempt to quit: 2021  . Smokeless tobacco: Never  . Tobacco comments:    Caregiver reports patient currently smokes approximately 2 cigarettes/day.  Substance Use Topics  . Alcohol use: Never     FAMILY HISTORY:   Family History  Problem Relation Age of Onset  . Arthritis Mother   . Dementia Mother   . Colon cancer Mother   . Arthritis Father   . Diabetes Father   . Stroke Father   . Colon cancer Father   . Heart attack Brother   . Breast cancer Sister   . Seizures Sister   . Cancer Brother        brain  . Heart disease Brother   . Heart attack Brother     DRUG ALLERGIES:   Allergies  Allergen Reactions  . Latex Hives  . Nicotine  Hives and Rash    Patches caused localized rash and hives     REVIEW OF SYSTEMS:   ROS As per history of present illness. All pertinent systems were reviewed above. Constitutional, HEENT, cardiovascular, respiratory, GI, GU, musculoskeletal, neuro, psychiatric, endocrine, integumentary and hematologic systems were reviewed and are otherwise negative/unremarkable except for positive findings mentioned above in the HPI.   MEDICATIONS AT HOME:   Prior to Admission medications   Medication Sig Start Date End Date Taking? Authorizing Provider  albuterol  (PROVENTIL ) (2.5 MG/3ML) 0.083% nebulizer solution USE 1 VIAL IN NEBULIZER EVERY 6 HOURS AS NEEDED FOR WHEEZING FOR SHORTNESS OF BREATH 09/25/23  Yes [name], [name], DO  allopurinol  (ZYLOPRIM ) 100 MG tablet Take 1 tablet (100 mg total) by mouth daily. 03/10/24  Yes [name], MD  amiodarone  (PACERONE ) 200 MG tablet Take 2 tablets by mouth twice daily 03/25/24  Yes End, [name], MD  baclofen  (LIORESAL ) 10 MG tablet Take 1 tablet (10 mg total) by mouth 2 (two) times daily. Take before food twice a day, stop if not helping 04/25/24 06/24/24 Yes [name], [name]  buPROPion  (WELLBUTRIN  XL) 300 MG 24 hr tablet Take 300 mg by mouth daily. 08/16/23  Yes [provider]  dapagliflozin  propanediol (FARXIGA ) 10 MG TABS tablet Take 1 tablet (10 mg total) by mouth daily before breakfast. 03/19/24  Yes End, [name], MD  enoxaparin  (LOVENOX ) 120 MG/0.8ML injection INJECT 0.72  ML (108MG ) INTO THE SKIN EVERY 12 HOURS 03/27/24  Yes [name], [name], DO  fenofibrate  (TRICOR ) 48 MG tablet Take 48 mg by mouth daily. 03/05/24  Yes [provider]  Fluticasone -Umeclidin-Vilant (TRELEGY ELLIPTA ) 200-62.5-25 MCG/ACT AEPB Inhale 1 puff into the lungs daily in the afternoon. 02/22/24  Yes [name], MD  losartan  (COZAAR ) 25 MG tablet Take 25 mg by mouth daily. 03/21/24  Yes [provider]  metFORMIN  (GLUCOPHAGE -XR) 500 MG 24 hr tablet Take 1 tablet by mouth once daily with breakfast 03/19/24  Yes [name], [name], DO  metoprolol  succinate (TOPROL -XL) 50 MG 24 hr tablet Take 1 tablet (50 mg total) by mouth daily. 03/19/24  Yes Rolan Ezra RAMAN, MD  mexiletine (MEXITIL ) 150 MG capsule Take 150 mg by mouth 3 (three) times daily. 02/07/24  Yes [provider]  midodrine  (PROAMATINE ) 2.5 MG tablet Take 1 tablet (2.5 mg total) by mouth 2 (two) times daily with a meal. 03/04/24  Yes [name], [name], DO  nortriptyline  (PAMELOR ) 10 MG capsule Take 1 capsule by mouth at bedtime 04/08/24  Yes [name], [name], DO  Omega-3 Fatty Acids (OMEGA 3 PO) Take 1,500 mg by mouth 2 (two) times daily.   Yes [provider]  ondansetron  (ZOFRAN -ODT) 4 MG disintegrating tablet Take 1 tablet (4 mg total) by mouth every 8 (eight) hours as needed for nausea or vomiting (before eating). 04/25/24 04/20/25 Yes [name], [name]  pantoprazole  (PROTONIX ) 40 MG tablet Take 1 tablet (40 mg total) by mouth 2 (two) times daily. Then go back to once a day Patient taking differently: Take 40 mg by mouth 2 (two) times daily. 04/25/24  Yes [name], [name]  potassium chloride  (MICRO-K ) 10 MEQ CR capsule Take 1 capsule (10 mEq total) by mouth daily. 04/24/24  Yes [name], [name], DO  rosuvastatin  (CRESTOR ) 40 MG tablet Take 1 tablet (40 mg total) by mouth daily. 04/06/23  Yes End, [name], MD  torsemide  (DEMADEX ) 20 MG tablet Take 1 tablet (20 mg total) by mouth daily.  03/21/24  Yes Rolan Ezra RAMAN, MD  traZODone  (DESYREL ) 50 MG tablet Take 0.5 tablets (25 mg total) by mouth at bedtime as needed for sleep. 03/17/24  Yes [name], [name], DO  VENTOLIN  HFA 108 (90 Base) MCG/ACT inhaler INHALE 2 PUFFS BY MOUTH EVERY 6 HOURS AS NEEDED FOR WHEEZING OR SHORTNESS OF BREATH 05/16/23  Yes [name], [name], DO  Semaglutide , 1 MG/DOSE, (OZEMPIC , 1 MG/DOSE,) 2 MG/1.5ML SOPN Inject 1 mg into the skin once a week. Patient not taking: Reported on 04/25/2024    [provider]  Spacer/Aero-Holding [facility] Use as directed Dx: COPD, J44.9 07/13/23   Vicci Bouchard P, DO  warfarin (COUMADIN ) 3 MG tablet Take 3 mg by mouth daily. Patient not taking: Reported on 04/30/2024 04/20/24   [provider]      VITAL SIGNS:  Blood pressure 98/68, pulse 80, temperature 98.2 F (36.8 C), temperature source Oral, resp. rate 17, height 5' 6 (1.676 m), weight 110.5 kg, SpO2 100%.  PHYSICAL EXAMINATION:  Physical Exam  GENERAL:  68 y.o.-year-old patient lying in the bed with no acute distress.  EYES: Pupils equal, round, reactive to light and accommodation. No scleral icterus. Extraocular muscles intact.  HEENT: Head atraumatic, normocephalic. Oropharynx and nasopharynx clear.  NECK:  Supple, no jugular venous distention. No thyroid  enlargement, no tenderness.  LUNGS: Normal breath sounds bilaterally, no wheezing, rales,rhonchi or crepitation. No use of accessory muscles of respiration.  CARDIOVASCULAR: Regular rate and rhythm, S1, S2 normal. No murmurs, rubs, or gallops.  ABDOMEN: Soft, nondistended, nontender. Bowel sounds present. No organomegaly or mass.  EXTREMITIES: No pedal edema, cyanosis, or clubbing.  NEUROLOGIC: Cranial nerves II through XII are intact. Muscle strength 5/5 in all extremities. Sensation intact. Gait not checked.  PSYCHIATRIC: The patient is alert and oriented x 3.  Normal affect and good eye contact. SKIN: No obvious rash, lesion, or  ulcer.   LABORATORY PANEL:   CBC Recent Labs  Lab 04/30/24 1905  WBC 10.9*  HGB 10.2*  HCT 32.2*  PLT 182   ------------------------------------------------------------------------------------------------------------------  Chemistries  Recent Labs  Lab 04/30/24 1905 04/30/24 1958  NA 138  --   K 2.9*  --   CL 103  --   CO2 23  --  GLUCOSE 110*  --   BUN 25*  --   CREATININE 1.87*  --   CALCIUM  8.3*  --   MG  --  1.7  AST 50*  --   ALT 45*  --   ALKPHOS 38  --   BILITOT 0.8  --    ------------------------------------------------------------------------------------------------------------------  Cardiac Enzymes No results for input(s): TROPONINI in the last 168 hours. ------------------------------------------------------------------------------------------------------------------  RADIOLOGY:  DG Chest Portable 1 View Result Date: 04/30/2024 CLINICAL DATA:  Weakness. EXAM: PORTABLE CHEST 1 VIEW COMPARISON:  Chest radiograph dated 02/08/2024. FINDINGS: There is cardiomegaly with mild central vascular congestion. No focal consolidation, pleural effusion or pneumothorax. Left pectoral AICD device. No acute osseous pathology. Sternal fixation hardware. IMPRESSION: Cardiomegaly with mild central vascular congestion. Electronically Signed   By: Roberto [name] M.D.   On: 04/30/2024 20:15   CT Head Wo Contrast Result Date: 04/30/2024 EXAM: CT HEAD AND CERVICAL SPINE 04/30/2024 07:39:30 PM TECHNIQUE: CT of the head and cervical spine was performed without the administration of intravenous contrast. Multiplanar reformatted images are provided for review. Automated exposure control, iterative reconstruction, and/or weight based adjustment of the mA/kV was utilized to reduce the radiation dose to as low as reasonably achievable. COMPARISON: None available. CLINICAL HISTORY: Head trauma, minor (Age >= 65y). RN notes: Pt arrived via EMS from home after wife drove him to the EMS  base for AMS. EMS states wife said his last known well was this a.m. before she went to work and had minor hallucinations when she came home. FINDINGS: CT HEAD BRAIN AND VENTRICLES: No acute intracranial hemorrhage. No mass effect or midline shift. No abnormal extra-axial fluid collection. No evidence of acute infarct. No hydrocephalus. ORBITS: No acute abnormality. SINUSES AND MASTOIDS: No acute abnormality. SOFT TISSUES AND SKULL: No acute skull fracture. No acute soft tissue abnormality. CT CERVICAL SPINE BONES AND ALIGNMENT: No acute fracture or traumatic malalignment. DEGENERATIVE CHANGES: No significant degenerative changes. SOFT TISSUES: No prevertebral soft tissue swelling. IMPRESSION: 1. No acute intracranial abnormality. 2. No acute fracture or traumatic malalignment of the cervical spine. Electronically signed by: Dorethia Molt MD 04/30/2024 07:48 PM EDT RP Workstation: [id number]   CT Cervical Spine Wo Contrast Result Date: 04/30/2024 EXAM: CT HEAD AND CERVICAL SPINE 04/30/2024 07:39:30 PM TECHNIQUE: CT of the head and cervical spine was performed without the administration of intravenous contrast. Multiplanar reformatted images are provided for review. Automated exposure control, iterative reconstruction, and/or weight based adjustment of the mA/kV was utilized to reduce the radiation dose to as low as reasonably achievable. COMPARISON: None available. CLINICAL HISTORY: Head trauma, minor (Age >= 64y). RN notes: Pt arrived via EMS from home after wife drove him to the EMS base for AMS. EMS states wife said his last known well was this a.m. before she went to work and had minor hallucinations when she came home. FINDINGS: CT HEAD BRAIN AND VENTRICLES: No acute intracranial hemorrhage. No mass effect or midline shift. No abnormal extra-axial fluid collection. No evidence of acute infarct. No hydrocephalus. ORBITS: No acute abnormality. SINUSES AND MASTOIDS: No acute abnormality. SOFT TISSUES AND  SKULL: No acute skull fracture. No acute soft tissue abnormality. CT CERVICAL SPINE BONES AND ALIGNMENT: No acute fracture or traumatic malalignment. DEGENERATIVE CHANGES: No significant degenerative changes. SOFT TISSUES: No prevertebral soft tissue swelling. IMPRESSION: 1. No acute intracranial abnormality. 2. No acute fracture or traumatic malalignment of the cervical spine. Electronically signed by: Dorethia Molt MD 04/30/2024 07:48 PM EDT RP Workstation: [id number]  IMPRESSION AND PLAN:  Assessment and Plan: No notes have been filed under this hospital service. Service: Hospitalist      DVT prophylaxis: Lovenox ***  Advanced Care Planning:  Code Status: full code***  Family Communication:  The plan of care was discussed in details with the patient (and family). I answered all questions. The patient agreed to proceed with the above mentioned plan. Further management will depend upon hospital course. Disposition Plan: Back to previous home environment Consults called: none***  All the records are reviewed and case discussed with ED provider.  Status is: Observation {Observation:23811}   At the time of the admission, it appears that the appropriate admission status for this patient is inpatient.  This is judged to be reasonable and necessary in order to provide the required intensity of service to ensure the patient's safety given the presenting symptoms, physical exam findings and initial radiographic and laboratory data in the context of comorbid conditions.  The patient requires inpatient status due to high intensity of service, high risk of further deterioration and high frequency of surveillance required.  I certify that at the time of admission, it is my clinical judgment that the patient will require inpatient hospital care extending more than 2 midnights.                            Dispo: The patient is from: Home              Anticipated d/c is to: Home               Patient currently is not medically stable to d/c.              Difficult to place patient: No  Madison DELENA Peaches M.D on 04/30/2024 at 10:59 PM  [facility]   From 7 PM-7 AM, contact night-coverage [email or web address]  CC: Primary care physician; [name], DO

## 2024-04-30 NOTE — ED Provider Notes (Signed)
 [facility] Provider Note    Event Date/Time   First MD Initiated Contact with Patient 04/30/24 1909     (approximate)   History   Altered Mental Status   HPI  Roberto [name] is a 68 year old male with history of hypertension, diabetes, CHF, COPD, mechanical heart valve replacement presenting to the emergency department for evaluation of altered mental status.  History obtained primarily from patient's wife.  She notes that over the past 3 weeks, patient has had intermittent episodes of confusion, worsened over the past couple of days.  No fevers, patient is not complaining of anything.  EMS reports that about a week ago patient had a fall, unsure if he hit his head.  EMS did note a episode of nonsustained V. tach as they were pulling into the emergency department, estimated 7 beat episode.     Physical Exam   Triage Vital Signs: ED Triage Vitals  Encounter Vitals Group     BP 04/30/24 1920 98/68     Girls Systolic BP Percentile --      Girls Diastolic BP Percentile --      Boys Systolic BP Percentile --      Boys Diastolic BP Percentile --      Pulse Rate 04/30/24 1920 80     Resp 04/30/24 1920 17     Temp 04/30/24 1920 98.2 F (36.8 C)     Temp Source 04/30/24 1920 Oral     SpO2 04/30/24 1918 98 %     Weight 04/30/24 1921 243 lb 9.7 oz (110.5 kg)     Height 04/30/24 1921 5' 6 (1.676 m)     Head Circumference --      Peak Flow --      Pain Score 04/30/24 1920 0     Pain Loc --      Pain Education --      Exclude from Growth Chart --     Most recent vital signs: Vitals:   04/30/24 1918 04/30/24 1920  BP:  98/68  Pulse:  80  Resp:  17  Temp:  98.2 F (36.8 C)  SpO2: 98% 100%     General: Awake, interactive  CV:  Good peripheral perfusion Resp:  Unlabored respirations Abd:  Nondistended, soft, nontender to palpation, bruising noted over the abdomen consistent with location of Lovenox  injections, whitish stool on rectal exam,  Hemoccult negative Neuro:  Keenly aware, correctly answers month and age, able to blink eyes and squeeze hands, normal horizontal extraocular movements, no visual field loss, normal facial symmetry, no arm or leg motor drift, no limb ataxia, normal sensation, no aphasia, no dysarthria, no inattention. NIH 0    ED Results / Procedures / Treatments   Labs (all labs ordered are listed, but only abnormal results are displayed) Labs Reviewed  CBC WITH DIFFERENTIAL/PLATELET - Abnormal; Notable for the following components:      Result Value   WBC 10.9 (*)    RBC 3.55 (*)    Hemoglobin 10.2 (*)    HCT 32.2 (*)    RDW 16.1 (*)    Neutro Abs 9.2 (*)    Lymphs Abs 0.6 (*)    All other components within normal limits  COMPREHENSIVE METABOLIC PANEL WITH GFR - Abnormal; Notable for the following components:   Potassium 2.9 (*)    Glucose, Bld 110 (*)    BUN 25 (*)    Creatinine, Ser 1.87 (*)    Calcium  8.3 (*)  Total Protein 6.0 (*)    Albumin 3.1 (*)    AST 50 (*)    ALT 45 (*)    GFR, Estimated 38 (*)    All other components within normal limits  URINALYSIS, W/ REFLEX TO CULTURE (INFECTION SUSPECTED) - Abnormal; Notable for the following components:   Color, Urine YELLOW (*)    APPearance CLEAR (*)    Leukocytes,Ua TRACE (*)    Bacteria, UA RARE (*)    All other components within normal limits  URINE DRUG SCREEN, QUALITATIVE ([facility] ONLY) - Abnormal; Notable for the following components:   Tricyclic, Ur Screen POSITIVE (*)    MDMA (Ecstasy)Ur Screen POSITIVE (*)    All other components within normal limits  TROPONIN I (HIGH SENSITIVITY) - Abnormal; Notable for the following components:   Troponin I (High Sensitivity) 26 (*)    All other components within normal limits  TROPONIN I (HIGH SENSITIVITY) - Abnormal; Notable for the following components:   Troponin I (High Sensitivity) 32 (*)    All other components within normal limits  AMMONIA  MAGNESIUM   PROTIME-INR  APTT      EKG EKG independently reviewed and interpreted by myself demonstrates:  EKG demonstrates likely paced rhythm at a rate of 97, frequent PVCs noted, QTc 541, QRS 207  RADIOLOGY Imaging independently reviewed and interpreted by myself demonstrates:  CT head without acute bleed CT C-spine without acute fracture Chest x-Pamela Intrieri without focal consolidation  Formal Radiology Read:  DG Chest Portable 1 View Result Date: 04/30/2024 CLINICAL DATA:  Weakness. EXAM: PORTABLE CHEST 1 VIEW COMPARISON:  Chest radiograph dated 02/08/2024. FINDINGS: There is cardiomegaly with mild central vascular congestion. No focal consolidation, pleural effusion or pneumothorax. Left pectoral AICD device. No acute osseous pathology. Sternal fixation hardware. IMPRESSION: Cardiomegaly with mild central vascular congestion. Electronically Signed   By: Vanetta Chou M.D.   On: 04/30/2024 20:15   CT Head Wo Contrast Result Date: 04/30/2024 EXAM: CT HEAD AND CERVICAL SPINE 04/30/2024 07:39:30 PM TECHNIQUE: CT of the head and cervical spine was performed without the administration of intravenous contrast. Multiplanar reformatted images are provided for review. Automated exposure control, iterative reconstruction, and/or weight based adjustment of the mA/kV was utilized to reduce the radiation dose to as low as reasonably achievable. COMPARISON: None available. CLINICAL HISTORY: Head trauma, minor (Age >= 65y). RN notes: Pt arrived via EMS from home after [place] drove him [place] the EMS [place] A[place]. EMS states wife said his last known well was this a.m. before she went to work and had minor hallucinations when she came home. FINDINGS: CT HEAD BRAIN AND VENTRICLES: No acute intracranial hemorrhage. No mass effect or midline shift. No abnormal extra-axial fluid collection. No evidence of acute infarct. No hydrocephalus. ORBITS: No acute abnormality. SINUSES AND MASTOIDS: No acute abnormality. SOFT TISSUES AND SKULL: No acute skull  fracture. No acute soft tissue abnormality. CT CERVICAL SPINE BONES AND ALIGNMENT: No acute fracture or traumatic malalignment. DEGENERATIVE CHANGES: No significant degenerative changes. SOFT TISSUES: No prevertebral soft tissue swelling. IMPRESSION: 1. No acute intracranial abnormality. 2. No acute fracture or traumatic malalignment of the cervical spine. Electronically signed by: Dorethia Molt MD 04/30/2024 07:48 PM EDT RP Workstation: [id number]   CT Cervical Spine Wo Contrast Result Date: 04/30/2024 EXAM: CT HEAD AND CERVICAL SPINE 04/30/2024 07:39:30 PM TECHNIQUE: CT of the head and cervical spine was performed without the administration of intravenous contrast. Multiplanar reformatted images are provided for review. Automated exposure control, iterative reconstruction, and/or weight  based adjustment of the mA/kV was utilized to reduce the radiation dose to as low as reasonably achievable. COMPARISON: None available. CLINICAL HISTORY: Head trauma, minor (Age >= 65y). RN notes: Pt arrived via EMS from home after wife drove him to the EMS base for AMS. EMS states wife said his last known well was this a.m. before she went to work and had minor hallucinations when she came home. FINDINGS: CT HEAD BRAIN AND VENTRICLES: No acute intracranial hemorrhage. No mass effect or midline shift. No abnormal extra-axial fluid collection. No evidence of acute infarct. No hydrocephalus. ORBITS: No acute abnormality. SINUSES AND MASTOIDS: No acute abnormality. SOFT TISSUES AND SKULL: No acute skull fracture. No acute soft tissue abnormality. CT CERVICAL SPINE BONES AND ALIGNMENT: No acute fracture or traumatic malalignment. DEGENERATIVE CHANGES: No significant degenerative changes. SOFT TISSUES: No prevertebral soft tissue swelling. IMPRESSION: 1. No acute intracranial abnormality. 2. No acute fracture or traumatic malalignment of the cervical spine. Electronically signed by: Dorethia Molt MD 04/30/2024 07:48 PM EDT RP  Workstation: [id number]    PROCEDURES:  Critical Care performed: Yes, see critical care procedure note(s)  CRITICAL CARE Performed by: Nilsa Dade   Total critical care time: 32 minutes  Critical care time was exclusive of separately billable procedures and treating other patients.  Critical care was necessary to treat or prevent imminent or life-threatening deterioration.  Critical care was time spent personally by me on the following activities: development of treatment plan with patient and/or surrogate as well as nursing, discussions with consultants, evaluation of patient's response to treatment, examination of patient, obtaining history from patient or surrogate, ordering and performing treatments and interventions, ordering and review of laboratory studies, ordering and review of radiographic studies, pulse oximetry and re-evaluation of patient's condition.   Procedures   MEDICATIONS ORDERED IN ED: Medications  potassium chloride  10 mEq in 100 mL IVPB (10 mEq Intravenous New Bag/Given 04/30/24 2146)  magnesium  sulfate IVPB 2 g 50 mL (has no administration in time range)     IMPRESSION / MDM / ASSESSMENT AND PLAN / ED COURSE  I reviewed the triage vital signs and the nursing notes.  Differential diagnosis includes, but is not limited to, intracranial bleed, spine fracture, electrolyte abnormality, pneumonia, UTI, other infectious source, arrhythmia, dehydration  Patient's presentation is most consistent with acute presentation with potential threat to life or bodily function.  68 year old male presenting to the emergency department for evaluation of altered mental status.  Stable vitals on presentation.  Labs with acute anemia with hemoglobin of 10.2, recently 14-week ago.  Is on Lovenox .  No reported bleeding sources.  Negative Hemoccult.  CMP with hypokalemia with K of 2.9, stable renal impairment.  Mild troponin elevation, not new for patient.  Negative ammonia level.   Unclear exact etiology of patient's altered mental status, but I am concerned about his rapid hemoglobin drop, particularly in light of his Lovenox  use.  Coags ordered.  I am also concerned about his frequent PVCs noted as well as episode of nonsustained V. tach with EMS and his electrolyte abnormalities.  Ordered for electrolyte repletion.  In the setting of these, do think patient is appropriate for admission. Will reach out to hospitalist team.   Clinical Course as of 04/30/24 2258  Wed Apr 30, 2024  2258 Case reviewed with Dr. Lawence. He will evaluate for anticipated admission.  [NR]    Clinical Course User Index [NR] Dade Nilsa, MD     FINAL CLINICAL IMPRESSION(S) / ED DIAGNOSES  Final diagnoses:  Acute blood loss anemia  Nonsustained ventricular tachycardia (HCC)  Hypokalemia     Rx / DC Orders   ED Discharge Orders     None        Note:  This document was prepared using Dragon voice recognition software and may include unintentional dictation errors.   Levander Slate, MD 04/30/24 2258

## 2024-04-30 NOTE — ED Triage Notes (Signed)
 Pt arrived via EMS from home after wife drove him to the EMS base for AMS. EMS states wife said his last known well was this a.m. before she went to work and had minor hallucinations when she came home.  Pt was staring off when brought in. Pt has a hx of tremors.  Pt is alert and oriented now.  Stroke screen negative. Pt on thinners.

## 2024-05-01 DIAGNOSIS — E876 Hypokalemia: Secondary | ICD-10-CM

## 2024-05-01 DIAGNOSIS — E1122 Type 2 diabetes mellitus with diabetic chronic kidney disease: Secondary | ICD-10-CM

## 2024-05-01 DIAGNOSIS — I480 Paroxysmal atrial fibrillation: Secondary | ICD-10-CM | POA: Insufficient documentation

## 2024-05-01 DIAGNOSIS — E785 Hyperlipidemia, unspecified: Secondary | ICD-10-CM | POA: Insufficient documentation

## 2024-05-01 DIAGNOSIS — D649 Anemia, unspecified: Secondary | ICD-10-CM

## 2024-05-01 DIAGNOSIS — G9341 Metabolic encephalopathy: Secondary | ICD-10-CM | POA: Diagnosis not present

## 2024-05-01 DIAGNOSIS — I10 Essential (primary) hypertension: Secondary | ICD-10-CM | POA: Insufficient documentation

## 2024-05-01 DIAGNOSIS — M109 Gout, unspecified: Secondary | ICD-10-CM

## 2024-05-01 LAB — BASIC METABOLIC PANEL WITH GFR
Anion gap: 11 (ref 5–15)
BUN: 24 mg/dL — ABNORMAL HIGH (ref 8–23)
CO2: 26 mmol/L (ref 22–32)
Calcium: 9 mg/dL (ref 8.9–10.3)
Chloride: 103 mmol/L (ref 98–111)
Creatinine, Ser: 1.79 mg/dL — ABNORMAL HIGH (ref 0.61–1.24)
GFR, Estimated: 41 mL/min — ABNORMAL LOW (ref >60)
Glucose, Bld: 89 mg/dL (ref 70–99)
Potassium: 2.8 mmol/L — ABNORMAL LOW (ref 3.5–5.1)
Sodium: 140 mmol/L (ref 135–145)

## 2024-05-01 LAB — IRON AND TIBC
Iron: 73 ug/dL (ref 45–182)
Saturation Ratios: 22 % (ref 17.9–39.5)
TIBC: 332 ug/dL (ref 250–450)
UIBC: 259 ug/dL

## 2024-05-01 LAB — FOLATE: Folate: 5 ng/mL — ABNORMAL LOW (ref >5.9)

## 2024-05-01 LAB — CBC
HCT: 35 % — ABNORMAL LOW (ref 39.0–52.0)
Hemoglobin: 11.2 g/dL — ABNORMAL LOW (ref 13.0–17.0)
MCH: 28.6 pg (ref 26.0–34.0)
MCHC: 32 g/dL (ref 30.0–36.0)
MCV: 89.5 fL (ref 80.0–100.0)
Platelets: 209 K/uL (ref 150–400)
RBC: 3.91 MIL/uL — ABNORMAL LOW (ref 4.22–5.81)
RDW: 16.2 % — ABNORMAL HIGH (ref 11.5–15.5)
WBC: 9.7 K/uL (ref 4.0–10.5)
nRBC: 0 % (ref 0.0–0.2)

## 2024-05-01 LAB — LACTATE DEHYDROGENASE: LDH: 233 U/L — ABNORMAL HIGH (ref 98–192)

## 2024-05-01 LAB — MAGNESIUM: Magnesium: 2 mg/dL (ref 1.7–2.4)

## 2024-05-01 LAB — HEMOGLOBIN A1C
Hgb A1c MFr Bld: 5.9 % — ABNORMAL HIGH (ref 4.8–5.6)
Mean Plasma Glucose: 122.63 mg/dL

## 2024-05-01 LAB — GLUCOSE, CAPILLARY
Glucose-Capillary: 108 mg/dL — ABNORMAL HIGH (ref 70–99)
Glucose-Capillary: 122 mg/dL — ABNORMAL HIGH (ref 70–99)
Glucose-Capillary: 196 mg/dL — ABNORMAL HIGH (ref 70–99)
Glucose-Capillary: 90 mg/dL (ref 70–99)
Glucose-Capillary: 96 mg/dL (ref 70–99)
Glucose-Capillary: 98 mg/dL (ref 70–99)

## 2024-05-01 LAB — VITAMIN B12: Vitamin B-12: 341 pg/mL (ref 180–914)

## 2024-05-01 LAB — FERRITIN: Ferritin: 292 ng/mL (ref 24–336)

## 2024-05-01 MED ORDER — INSULIN ASPART 100 UNIT/ML IJ SOLN
0.0000 [IU] | Freq: Three times a day (TID) | INTRAMUSCULAR | Status: DC
  Administered 2024-05-01: 3 [IU] via SUBCUTANEOUS
  Filled 2024-05-01 (×3): qty 1

## 2024-05-01 MED ORDER — ENSURE PLUS HIGH PROTEIN PO LIQD
237.0000 mL | Freq: Two times a day (BID) | ORAL | Status: DC
  Administered 2024-05-01 – 2024-05-02 (×3): 237 mL via ORAL

## 2024-05-01 MED ORDER — INSULIN ASPART 100 UNIT/ML IJ SOLN: 0.0000 [IU] | Freq: Every day | INTRAMUSCULAR | Status: DC

## 2024-05-01 MED ORDER — MIDODRINE HCL 5 MG PO TABS
10.0000 mg | ORAL_TABLET | Freq: Three times a day (TID) | ORAL | Status: DC
  Administered 2024-05-02 (×2): 10 mg via ORAL
  Filled 2024-05-01 (×2): qty 2

## 2024-05-01 MED ORDER — LACTATED RINGERS IV BOLUS
250.0000 mL | Freq: Once | INTRAVENOUS | Status: AC
  Administered 2024-05-01: 250 mL via INTRAVENOUS

## 2024-05-01 MED ORDER — POTASSIUM CHLORIDE CRYS ER 20 MEQ PO TBCR
40.0000 meq | EXTENDED_RELEASE_TABLET | ORAL | Status: AC
  Administered 2024-05-01 (×2): 40 meq via ORAL
  Filled 2024-05-01 (×2): qty 2

## 2024-05-01 MED ORDER — MIDODRINE HCL 5 MG PO TABS
5.0000 mg | ORAL_TABLET | Freq: Once | ORAL | Status: AC
  Administered 2024-05-01: 5 mg via ORAL
  Filled 2024-05-01: qty 1

## 2024-05-01 MED ORDER — POTASSIUM CHLORIDE 20 MEQ PO PACK
40.0000 meq | PACK | Freq: Once | ORAL | Status: AC
  Administered 2024-05-01: 40 meq via ORAL
  Filled 2024-05-01: qty 2

## 2024-05-01 MED ORDER — MIDODRINE HCL 5 MG PO TABS
10.0000 mg | ORAL_TABLET | Freq: Three times a day (TID) | ORAL | Status: DC
Start: 2024-05-01 — End: 2024-05-01

## 2024-05-01 MED ORDER — MIDODRINE HCL 5 MG PO TABS
5.0000 mg | ORAL_TABLET | Freq: Three times a day (TID) | ORAL | Status: DC
Start: 2024-05-01 — End: 2024-05-01
  Administered 2024-05-01 (×2): 5 mg via ORAL
  Filled 2024-05-01 (×2): qty 1

## 2024-05-01 NOTE — Assessment & Plan Note (Signed)
-   Will continue antihypertensive therapy.

## 2024-05-01 NOTE — Assessment & Plan Note (Signed)
-   Will continue Wellbutrin XL.. 

## 2024-05-01 NOTE — Discharge Instructions (Signed)
 Food Resources  Agency Name: [facility] Agency Address: [address], [place], [place] [address] Phone: [phone] Website: [email or web address] Service(s) Offered: Housing services, self-sufficiency, congregate meal program, weatherization program, Event organiser program, emergency food assistance,  housing counseling, home ownership program, wheels - to work program.  [employer] free for 60 and older at various locations from [employer], Monday-Friday:  [employer], [address] [place], [phone] -[facility], [address], [place] [phone]  -[facility], [address] [place] [phone]  -[address] [place], [phone]  Agency Name: [facility] Address: [phone] [address], [place], [place] [address] Phone: [phone] Website: [email or web address] Service(s) Offered: Home delivered hot, frozen, and emergency  meals. Grocery assistance program which matches  volunteers one-on-one with seniors unable to grocery shop  for themselves. Must be 60 years and older; less than 20  hours of in-home aide service, limited or no driving ability;  live alone or with someone with a disability; live in  [place].  Agency Name: [facility] [employer]) Address: [address] [place], [place] [address] Phone: [phone] Service(s) Offered: Food is served to shut-ins, homeless, elderly, and low income people in the community every Saturday (11:30 am-12:30 pm) and Sunday (12:30 pm-1:30pm). Volunteers also offer help and encouragement in seeking employment,  and spiritual guidance.  Agency Name: Department [facility] Social Services Address: [address] [place], [place] [address] Phone: [phone] Service(s) Offered: Child support services; child welfare services; food stamps; Medicaid; work first family assistance; and aid  with fuel,  rent, food and medicine.  Agency Name: Dietitian Address: [address], [place], [place] Phone: [phone] Website: [email or web address] Services Offered: Monday 10:00am-12:00, 8:00pm-9:00pm, and Friday 10:00am-12:00.  Agency Name: [employer] [facility] Address: [address], [place], [place] [address] Phone: [phone] Website: [email or web address] Service(s) Offered: Serves weekday meals, open from 11:30 am- 1:00 pm., and 6:30-7:30pm, Monday-Wednesday-Friday distributes food 3:30-6pm, Monday-Wednesday-Friday.  Agency Name: [facility] Address: [address] [place], [place] Phone: [phone] Website: [email or web address] Services Offered: Distributes food the 4th Saturday of the month, starting at 8:00 am  Agency Name: [facility] Address: [address], [place], [place] [address] Phone: [phone] Website: [email or web address] Service(s) Offered: Bread of life, weekly food pantry. Open Wednesdays from 10:00am-noon.  Agency Name: [facility] [employer] Address: [address] [place], [place], [place] Phone: [phone] Services Offered: Distributes food 9am-1pm, Monday-Thursday. Call for details.  Agency Name: [facility] Address: [name] [place], [place] [address] Phone: [phone] Website: [email or web address] Service(s) Offered: Games developer. Call for assistance.  Agency Name: [facility] Address: [address] [place], [place] [address] Phone: [phone] Service Offered: Emergency Food Pantry. Call for appointment.  Agency Name: [facility] Address: [address], [place], [place] [address] Phone: [phone] Website: [email or web address] Services Offered: Games developer. Call for details  Agency Name: [facility] Address: [address] [place], [place] Phone:  [phone] Website: [email or web address] Service(s) Offered: Emergency Food Pantry. Call for details.  Agency Name: Holiday representative Address: [address] [place], [place] [address] Phone: [phone] or [phone] Website: [email or web address] Service(s) Offered: Distribute food 9am-11:30 am, Tuesday-Friday, and 1-3:30pm, Monday-Friday. Food pantry Monday-Friday 1pm-3pm, fresh items, Mon.-Wed.-Fri.  Agency Name: [facility] Address: [address] [place], [place] [address] Phone: [phone] Website: [email or web address] Services Offered: Distribute food Tues and Sats from 9:00am-noon.  Closed 1st Saturday of each month. Call for details  Agency Name: [employer] Address: [facility] [address], [place] [address] Phone: [phone]  Services Offered: Delivers meals every Thursday     Transportation Resources for [employer] Area  Agency Name: [facility] Agency Address: [address] [place], [place] [address] Phone: [phone] Email: [email or web address] Website: [email or web address] Service(s) Offered: Housing services, self-sufficiency, congregate meal program, weatherization program, Field seismologist program, emergency food assistance,  housing counseling, home ownership program, wheels-towork program.  Agency Name: [facility] [employer] [phone]) Address: [address] [place], [place] [address] Phone: [phone] Website: [email or web address] Service(s) Offered: Transportation for [employer], subscription and demand response; Dial-a-Ride for citizens 30 years of age or older.  Agency Name: Department of Social Services Address: [address] [place], [place] [address] Phone: [phone] Service(s) Offered: Child support services; child welfare services; food stamps; Medicaid; work first family assistance;  and aid with fuel,  rent, food and medicine, transportation assistance.  Agency Name: Disabled [employer] (DAV) Transportation  Network Phone: [phone] Service(s) Offered: Transports veterans to the [facility] medical center. Call  forty-eight hours in advance and leave the name, telephone  number, date, and time of appointment. Veteran will be  contacted by the driver the day before the appointment to  arrange a pick up point    [employer] ACTA currently provides door to door services. ACTA connects with PART daily for services to [facility]. ACTA also performs contract services to [employer] operates 27 vehicles, all but 3 mini-vans are equipped with lifts for special needs as well as the general public. ACTA drivers are each CDL certified and trained in First Aid and CPR. ACTA was established in 2002 by [employer]. An independent Industrial/product designer. ACTA operates via Cytogeneticist with required local 10% match funding from [employer]. ACTA provides over 80,000 passenger trips each year, including [facility] and [employer] sites.  Call at least by 11 AM one business day prior to needing transportation  [employer].                      [place], [place] [address]     Office Hours: Monday-Friday  8 AM - 5 PM  Do you feel isolated?  The [facility]ging offers a [employer] that anyone can call toll free at [phone]. The friendship line is available 24 hours a day  [employer] is a Program of All-inclusive Care for the Elderly (PACE). Their mission is to promote and sustain the independence of seniors wishing to remain in the community. They provide seniors with comprehensive long-term health, social, medical and dietary care. Their program is a safe alternative to nursing home care.  [phone]  [facility] Physical Address [facility] [address] [place], [place] [address] Phone: [phone]. . Online zoom yoga class, connect with others without leaving your home [facility] offers [place] dance cardio sessions for individuals via Zoom. This program provides: - Dance fitness activities Please contact program for more information. Servinganyone in need adults 18+ hiv/aids individuals families Call [phone]  Email [email or web address] to get more info  [employer] offers an online [employer] to individuals where they can receive help to focus on their best health. Whether you're a [employer] member or not, the neighborhood center offers  a... Passenger transport manager education  exercise & fitness  community support services  recreation  virtual support Other Servicessupport groups Servinganyone in need adults young adults teens seniors individuals families [email or web address] to get more info  Schedule on their website  The [facility] offers an array of activities for adults age 4 and over. This program provides:- Fitness and health programs- Tech classes- Activity books Main Serviceshealth education  community support services  exercise & fitness  recreation  more education Servingseniors  Call [phone]    For more resources go online to [email or web address] and type in you zipcode

## 2024-05-01 NOTE — Progress Notes (Signed)
 MD aware of lower blood pressures. Antihypertensives discontinued and bolus given.

## 2024-05-01 NOTE — Assessment & Plan Note (Signed)
-   Will continue Lovenox  at this time as I believe the benefits outweigh the risk.

## 2024-05-01 NOTE — Assessment & Plan Note (Signed)
Will continue allopurinol. 

## 2024-05-01 NOTE — Assessment & Plan Note (Signed)
-   Continue statin therapy and Tricor .

## 2024-05-01 NOTE — Assessment & Plan Note (Signed)
-   Will obtain anemia workup. - Stool Hemoccult was negative. - This could be related to blood loss still due to Lovenox  as shown by his abdominal ecchymosis. - At this time he does not need blood transfusion. - Will follow serial hemoglobins and hematocrits.

## 2024-05-01 NOTE — Assessment & Plan Note (Signed)
-   The patient will be placed on supplemental coverage with NovoLog. - Will hold off metformin. - Will continue [place].

## 2024-05-01 NOTE — Progress Notes (Addendum)
 Patient pulled out PIV last evening. IV team consult order placed. MD informed regarding Kcl fluid administration being paused at this time.

## 2024-05-01 NOTE — Progress Notes (Signed)
 Communication with MD continues regarding BP. New orders placed for 10mg  midodrine . Patient is asymptomatic. No signs of distress.

## 2024-05-01 NOTE — Assessment & Plan Note (Addendum)
-   Potassium will be replaced and followed. - Magnesium  will be optimized

## 2024-05-01 NOTE — Assessment & Plan Note (Signed)
-   Will continue amiodarone  and Lovenox .

## 2024-05-01 NOTE — Assessment & Plan Note (Signed)
-   The patient will be admitted to an observation medical telemetry bed. - Will follow neurochecks every 4 hours for 24 hours. - At this time is fairly close to his baseline.

## 2024-05-01 NOTE — Plan of Care (Signed)

## 2024-05-01 NOTE — Progress Notes (Addendum)
 Progress Note    Roberto [name]  FMW:[id number] DOB: December 10, 1955  DOA: 04/30/2024 PCP: [name], DO      Brief Narrative:    Medical records reviewed and are as summarized below:  Roberto [name] is a 68 y.o. male  with medical history significant for essential hypertension, dyslipidemia, bicuspid aortic valve status post mechanical valve replacement on Lovenox ,, CVA/TIA, CKD stage IIIb, who was brought to the emergency department because of acute change in mental status.  Reportedly, patient was found outside the house disoriented.  There was also report of nausea and vomiting prior to admission but no diarrhea or abdominal pain.  Initial vital signs in the ED: Temperature 98.2 F, respiratory 17, pulse 80, BP 98/68, O2 sat 100% on 2 L oxygen.  BP dropped to 81/59.  Initial labs significant for potassium 2.9, BUN 25, creatinine 1.87, AST 50, ALT 45, GFR 38, initial troponin 26 and repeat troponin 32.   CT head and CT cervical spine did not show any acute abnormality. Chest x-ray showed cardiomegaly with mild central vascular congestion  Urine toxicology screen positive for tricyclic's, MDMA (ecstasy) with an annotation that trazodone  may cause false positive MDMA. Of note, patient is on nortriptyline .  He he was admitted to the hospital for acute metabolic encephalopathy and hypokalemia.  Assessment/Plan:   Principal Problem:   Acute metabolic encephalopathy Active Problems:   Normocytic anemia   H/O mechanical aortic valve replacement   Hypokalemia   Type 2 diabetes mellitus with chronic kidney disease, without long-term current use of insulin  (HCC)   Gout   Depression   Paroxysmal atrial fibrillation (HCC)   Dyslipidemia   Essential hypertension    Body mass index is 39.32 kg/m.  (Class II obesity)    Acute metabolic encephalopathy: Improved and he appears back to baseline.  Etiology unclear.   Hypokalemia: Potassium down from 2.9-2.8.  Replete  potassium and monitor levels.   Acute on chronic hypotension: BP was down to 80/62.  Patient was given to 250 mL of Ringer's lactate bolus.  Monitor BP closely. Continue midodrine .  Increase the dose from 2.5 mg twice daily to 5 mg 3 times daily His partner confirmed that BP usually stays low, with systolic BP sometimes in the upper 80s or 90s.  She also confirmed that patient takes losartan , metoprolol  and torsemide  at home. Hold losartan , metoprolol  and torsemide  for now.   Chronic systolic and diastolic CHF: Compensated Status mechanical aortic valve replacement for aortic stenosis, paroxysmal atrial fibrillation, NSVT: Continue therapeutic dose of Lovenox . On amiodarone  for A-fib and mexiletine for ventricular tachycardia. His significant other said patient has been on Lovenox  for about a month.  Warfarin was temporarily discontinued because his INR was all over the place because of poor oral intake and recurrent vomiting when he had issues with his esophagus.  His partner said that there are plans to take him off of Lovenox  and resume warfarin in the future. 2D echo in March 2025 showed EF estimated 25 to 30%, grade 2 diastolic dysfunction, mild to moderate MR, mild to moderate TR, repaired/replaced aortic valve.   CKD stage IIIb: Creatinine is at baseline.   Elevated liver enzymes: This appears to be chronic.  Patient has fatty liver.   Comorbidities include history of stroke/TIA, hyperlipidemia, aneurysm of ascending aorta   Diet Order             Diet regular Fluid consistency: Thin  Diet effective now  Consultants: None  Procedures: None    Medications:    allopurinol   100 mg Oral Daily   amiodarone   400 mg Oral BID   baclofen   10 mg Oral BID WC   buPROPion   300 mg Oral Daily   dapagliflozin  propanediol  10 mg Oral QAC breakfast   enoxaparin   110 mg Subcutaneous Q12H   feeding supplement  237 mL Oral BID BM    fenofibrate   54 mg Oral Daily   insulin  aspart  0-15 Units Subcutaneous TID WC   insulin  aspart  0-5 Units Subcutaneous QHS   mexiletine  150 mg Oral TID   midodrine   2.5 mg Oral BID WC   nortriptyline   10 mg Oral QHS   omega-3 acid ethyl esters  1 capsule Oral BID   pantoprazole   40 mg Oral BID   potassium chloride   40 mEq Oral Q4H   rosuvastatin   40 mg Oral Daily   Continuous Infusions:     Anti-infectives (From admission, onward)    None              Family Communication/Anticipated D/C date and plan/Code Status   DVT prophylaxis:      Code Status: Full Code  Family Communication: Plan discussed with [name], significant other. Disposition Plan: Plan to discharge home   Status is: Observation The patient will require care spanning > 2 midnights and should be moved to inpatient because: Hypotension, hypokalemia       Subjective:   Stable events noted.  He has no complaints.  He feels better and back to baseline.  No dizziness, shortness of breath or chest pain.  BP was down to 80/62 this morning.  Patient says this blood pressure usually runs low  Objective:    Vitals:   05/01/24 0010 05/01/24 0348 05/01/24 0840 05/01/24 1000  BP: (!) 87/62 (!) 81/59 (!) 80/62 90/68  Pulse: 80 80 79   Resp: 20 20    Temp: 98.2 F (36.8 C) 97.6 F (36.4 C) 97.8 F (36.6 C)   TempSrc: Oral  Oral   SpO2: 97% 96% 95%   Weight:      Height:       No data found.   Intake/Output Summary (Last 24 hours) at 05/01/2024 1214 Last data filed at 05/01/2024 1008 Gross per 24 hour  Intake 180 ml  Output --  Net 180 ml   Filed Weights   04/30/24 1921  Weight: 110.5 kg    Exam:  GEN: NAD SKIN: Warm and dry.  Ecchymotic areas on abdomen (from Lovenox  injections) EYES: No pallor or icterus ENT: MMM CV: RRR PULM: CTA B ABD: soft, ND, NT, +BS CNS: AAO x 3, non focal EXT: No edema or tenderness. Toes with chronic ?purple discoloration.  Dystrophic nails        Data Reviewed:   I have personally reviewed following labs and imaging studies:  Labs: Labs show the following:   Basic Metabolic Panel: Recent Labs  Lab 04/30/24 1905 04/30/24 1958 05/01/24 0424  NA 138  --  140  K 2.9*  --  2.8*  CL 103  --  103  CO2 23  --  26  GLUCOSE 110*  --  89  BUN 25*  --  24*  CREATININE 1.87*  --  1.79*  CALCIUM  8.3*  --  9.0  MG  --  1.7 2.0   GFR Estimated Creatinine Clearance: 45.4 mL/min (A) (by C-G formula based on SCr of 1.79 mg/dL (H)).  Liver Function Tests: Recent Labs  Lab 04/30/24 1905  AST 50*  ALT 45*  ALKPHOS 38  BILITOT 0.8  PROT 6.0*  ALBUMIN 3.1*   No results for input(s): LIPASE, AMYLASE in the last 168 hours. Recent Labs  Lab 04/30/24 1958  AMMONIA <13   Coagulation profile Recent Labs  Lab 04/30/24 2323  INR 1.2    CBC: Recent Labs  Lab 04/30/24 1905 05/01/24 0424  WBC 10.9* 9.7  NEUTROABS 9.2*  --   HGB 10.2* 11.2*  HCT 32.2* 35.0*  MCV 90.7 89.5  PLT 182 209   Cardiac Enzymes: No results for input(s): CKTOTAL, CKMB, CKMBINDEX, TROPONINI in the last 168 hours. BNP (last 3 results) No results for input(s): PROBNP in the last 8760 hours. CBG: Recent Labs  Lab 05/01/24 0012 05/01/24 0350 05/01/24 0842 05/01/24 1210  GLUCAP 108* 98 90 122*   D-Dimer: No results for input(s): DDIMER in the last 72 hours. Hgb A1c: Recent Labs    04/30/24 1905  HGBA1C 5.9*   Lipid Profile: No results for input(s): CHOL, HDL, LDLCALC, TRIG, CHOLHDL, LDLDIRECT in the last 72 hours. Thyroid  function studies: No results for input(s): TSH, T4TOTAL, T3FREE, THYROIDAB in the last 72 hours.  Invalid input(s): FREET3 Anemia work up: Recent Labs    04/30/24 1905 04/30/24 1958 05/01/24 0424  VITAMINB12  --   --  341  FOLATE  --  5.0*  --   FERRITIN  --  292  --   TIBC  --  332  --   IRON  --  73  --   RETICCTPCT 1.9  --   --    Sepsis Labs: Recent Labs  Lab  04/30/24 1905 05/01/24 0424  WBC 10.9* 9.7    Microbiology No results found for this or any previous visit (from the past 240 hours).  Procedures and diagnostic studies:  DG Chest Portable 1 View Result Date: 04/30/2024 CLINICAL DATA:  Weakness. EXAM: PORTABLE CHEST 1 VIEW COMPARISON:  Chest radiograph dated 02/08/2024. FINDINGS: There is cardiomegaly with mild central vascular congestion. No focal consolidation, pleural effusion or pneumothorax. Left pectoral AICD device. No acute osseous pathology. Sternal fixation hardware. IMPRESSION: Cardiomegaly with mild central vascular congestion. Electronically Signed   By: Vanetta Chou M.D.   On: 04/30/2024 20:15   CT Head Wo Contrast Result Date: 04/30/2024 EXAM: CT HEAD AND CERVICAL SPINE 04/30/2024 07:39:30 PM TECHNIQUE: CT of the head and cervical spine was performed without the administration of intravenous contrast. Multiplanar reformatted images are provided for review. Automated exposure control, iterative reconstruction, and/or weight based adjustment of the mA/kV was utilized to reduce the radiation dose to as low as reasonably achievable. COMPARISON: None available. CLINICAL HISTORY: Head trauma, minor (Age >= 65y). RN notes: Pt arrived via EMS from home after wife drove him [place] the EMS base for A[place]. EMS states wife said his last known well was this a.m. before she went to work and had minor hallucinations when she came home. FINDINGS: CT HEAD BRAIN AND VENTRICLES: No acute intracranial hemorrhage. No mass effect or midline shift. No abnormal extra-axial fluid collection. No evidence of acute infarct. No hydrocephalus. ORBITS: No acute abnormality. SINUSES AND MASTOIDS: No acute abnormality. SOFT TISSUES AND SKULL: No acute skull fracture. No acute soft tissue abnormality. CT CERVICAL SPINE BONES AND ALIGNMENT: No acute fracture or traumatic malalignment. DEGENERATIVE CHANGES: No significant degenerative changes. SOFT TISSUES: No  prevertebral soft tissue swelling. IMPRESSION: 1. No acute intracranial abnormality. 2. No acute fracture  or traumatic malalignment of the cervical spine. Electronically signed by: Dorethia Molt MD 04/30/2024 07:48 PM EDT RP Workstation: [id number]   CT Cervical Spine Wo Contrast Result Date: 04/30/2024 EXAM: CT HEAD AND CERVICAL SPINE 04/30/2024 07:39:30 PM TECHNIQUE: CT of the head and cervical spine was performed without the administration of intravenous contrast. Multiplanar reformatted images are provided for review. Automated exposure control, iterative reconstruction, and/or weight based adjustment of the mA/kV was utilized to reduce the radiation dose to as low as reasonably achievable. COMPARISON: None available. CLINICAL HISTORY: Head trauma, minor (Age >= 65y). RN notes: Pt arrived via EMS from home after wife drove him to the EMS base for AMS. EMS states wife said his last known well was this a.m. before she went to work and had minor hallucinations when she came home. FINDINGS: CT HEAD BRAIN AND VENTRICLES: No acute intracranial hemorrhage. No mass effect or midline shift. No abnormal extra-axial fluid collection. No evidence of acute infarct. No hydrocephalus. ORBITS: No acute abnormality. SINUSES AND MASTOIDS: No acute abnormality. SOFT TISSUES AND SKULL: No acute skull fracture. No acute soft tissue abnormality. CT CERVICAL SPINE BONES AND ALIGNMENT: No acute fracture or traumatic malalignment. DEGENERATIVE CHANGES: No significant degenerative changes. SOFT TISSUES: No prevertebral soft tissue swelling. IMPRESSION: 1. No acute intracranial abnormality. 2. No acute fracture or traumatic malalignment of the cervical spine. Electronically signed by: Dorethia Molt MD 04/30/2024 07:48 PM EDT RP Workstation: [id number]               LOS: 0 days   Moana Munford  [facility]   Pager on [email or web address]. If 7PM-7AM, please contact night-coverage at [email or web address]     05/01/2024,  12:14 PM

## 2024-05-01 NOTE — Care Management Obs Status (Signed)
 MEDICARE OBSERVATION STATUS NOTIFICATION   Patient Details  Name: Roberto [name] MRN: [id number] Date of Birth: 08/14/1955   Medicare Observation Status Notification Given:  Yes    [name] 05/01/2024, 12:57 PM

## 2024-05-02 ENCOUNTER — Other Ambulatory Visit: Payer: Self-pay

## 2024-05-02 ENCOUNTER — Emergency Department

## 2024-05-02 ENCOUNTER — Emergency Department
Admission: EM | Admit: 2024-05-02 | Discharge: 2024-05-02 | Disposition: A | Discharge status: Home / Self Care | Source: Home / Self Care

## 2024-05-02 DIAGNOSIS — I2510 Atherosclerotic heart disease of native coronary artery without angina pectoris: Secondary | ICD-10-CM | POA: Insufficient documentation

## 2024-05-02 DIAGNOSIS — I5023 Acute on chronic systolic (congestive) heart failure: Secondary | ICD-10-CM | POA: Insufficient documentation

## 2024-05-02 DIAGNOSIS — I480 Paroxysmal atrial fibrillation: Secondary | ICD-10-CM | POA: Insufficient documentation

## 2024-05-02 DIAGNOSIS — W19XXXA Unspecified fall, initial encounter: Secondary | ICD-10-CM | POA: Insufficient documentation

## 2024-05-02 DIAGNOSIS — Z950 Presence of cardiac pacemaker: Secondary | ICD-10-CM | POA: Insufficient documentation

## 2024-05-02 DIAGNOSIS — R944 Abnormal results of kidney function studies: Secondary | ICD-10-CM | POA: Insufficient documentation

## 2024-05-02 DIAGNOSIS — M545 Low back pain, unspecified: Secondary | ICD-10-CM | POA: Insufficient documentation

## 2024-05-02 DIAGNOSIS — N1831 Chronic kidney disease, stage 3a: Secondary | ICD-10-CM | POA: Insufficient documentation

## 2024-05-02 DIAGNOSIS — E1122 Type 2 diabetes mellitus with diabetic chronic kidney disease: Secondary | ICD-10-CM | POA: Insufficient documentation

## 2024-05-02 DIAGNOSIS — I130 Hypertensive heart and chronic kidney disease with heart failure and stage 1 through stage 4 chronic kidney disease, or unspecified chronic kidney disease: Secondary | ICD-10-CM | POA: Insufficient documentation

## 2024-05-02 DIAGNOSIS — E1140 Type 2 diabetes mellitus with diabetic neuropathy, unspecified: Secondary | ICD-10-CM | POA: Insufficient documentation

## 2024-05-02 DIAGNOSIS — Z79899 Other long term (current) drug therapy: Secondary | ICD-10-CM | POA: Insufficient documentation

## 2024-05-02 DIAGNOSIS — J449 Chronic obstructive pulmonary disease, unspecified: Secondary | ICD-10-CM | POA: Insufficient documentation

## 2024-05-02 DIAGNOSIS — G9341 Metabolic encephalopathy: Secondary | ICD-10-CM | POA: Diagnosis not present

## 2024-05-02 LAB — RENAL FUNCTION PANEL
Albumin: 3.2 g/dL — ABNORMAL LOW (ref 3.5–5.0)
Anion gap: 8 (ref 5–15)
BUN: 25 mg/dL — ABNORMAL HIGH (ref 8–23)
CO2: 25 mmol/L (ref 22–32)
Calcium: 9 mg/dL (ref 8.9–10.3)
Chloride: 106 mmol/L (ref 98–111)
Creatinine, Ser: 1.73 mg/dL — ABNORMAL HIGH (ref 0.61–1.24)
GFR, Estimated: 42 mL/min — ABNORMAL LOW (ref >60)
Glucose, Bld: 96 mg/dL (ref 70–99)
Phosphorus: 1.9 mg/dL — ABNORMAL LOW (ref 2.5–4.6)
Potassium: 4.4 mmol/L (ref 3.5–5.1)
Sodium: 139 mmol/L (ref 135–145)

## 2024-05-02 LAB — GLUCOSE, CAPILLARY
Glucose-Capillary: 79 mg/dL (ref 70–99)
Glucose-Capillary: 91 mg/dL (ref 70–99)
Glucose-Capillary: 122 mg/dL — ABNORMAL HIGH (ref 70–99)

## 2024-05-02 LAB — MAGNESIUM: Magnesium: 2.3 mg/dL (ref 1.7–2.4)

## 2024-05-02 MED ORDER — LIDOCAINE 5 % EX PTCH
1.0000 | MEDICATED_PATCH | CUTANEOUS | Status: DC
  Administered 2024-05-02: 1 via TRANSDERMAL
  Filled 2024-05-02: qty 1

## 2024-05-02 MED ORDER — K PHOS MONO-SOD PHOS DI & MONO 155-852-130 MG PO TABS: 500.0000 mg | ORAL_TABLET | Freq: Three times a day (TID) | ORAL | 0 refills | Status: AC

## 2024-05-02 MED ORDER — LIDOCAINE 5 % EX PTCH: 1.0000 | MEDICATED_PATCH | Freq: Two times a day (BID) | CUTANEOUS | 0 refills | Status: DC

## 2024-05-02 MED ORDER — TRAMADOL HCL 50 MG PO TABS: 50.0000 mg | ORAL_TABLET | Freq: Four times a day (QID) | ORAL | 0 refills | Status: DC | PRN

## 2024-05-02 MED ORDER — K PHOS MONO-SOD PHOS DI & MONO 155-852-130 MG PO TABS
500.0000 mg | ORAL_TABLET | Freq: Three times a day (TID) | ORAL | Status: DC
  Administered 2024-05-02: 500 mg via ORAL
  Filled 2024-05-02 (×2): qty 2

## 2024-05-02 MED ORDER — TRAMADOL HCL 50 MG PO TABS
50.0000 mg | ORAL_TABLET | Freq: Once | ORAL | Status: AC
  Administered 2024-05-02: 50 mg via ORAL
  Filled 2024-05-02: qty 1

## 2024-05-02 NOTE — ED Triage Notes (Signed)
 First Nurse Note: Paitent to ED via [facility] from home for weakness. Dc'd from hospital today. PT was getting out of vehicle today and had a mechanical fall- lost his footing. C/o low back pain. PT states he should not have been dc'd this AM.EMS max assist standing.   127/78 72 HR 128 cbg

## 2024-05-02 NOTE — Discharge Instructions (Addendum)
 You have been diagnosed with fall, lumbar back pain.  Please take tramadol 1 tablet by mouth every 6 hours as needed for pain.  You can apply lidocaine  patch onto the skin every 12 hours.  Please call [facility] make an appointment for a follow-up.  You can go to [facility] as a walk-in and they will see you.  Please come back to ED or go to your PCP if you have new symptoms or symptoms worsen.  It was a pleasure to help you today.  Axzel Rockhill [name]

## 2024-05-02 NOTE — Progress Notes (Signed)
 Nutrition Brief Note  Patient identified on the Malnutrition Screening Tool (MST) Report  Wt Readings from Last 15 Encounters:  04/30/24 110.5 kg  04/25/24 101.8 kg  04/23/24 100.2 kg  04/23/24 100.2 kg  03/28/24 107 kg  03/28/24 107 kg  03/28/24 107 kg  03/26/24 107 kg  03/25/24 106.7 kg  03/24/24 106.3 kg  03/21/24 107.5 kg  03/20/24 108.6 kg  03/19/24 108.9 kg  03/19/24 108.7 kg  03/17/24 109.9 kg   [name] with medical history significant for essential hypertension, dyslipidemia, bicuspid aortic valve status post mechanical valve replacement on Lovenox ,, CVA/TIA and stage III chronic kidney disease, who presented with acute onset of altered mental status.   Patient admitted with acute metabolic encephalopathy.   Spoke with patient at bedside, who was pleasant and in good spirits today. He was smiling and joking with RD throughout visit. He reports feeling well and has a good appetite. He consumed all of his breakfast, other than his apple, which he does not like. He usually consumes 2 meals per day (Dinner: meat, starch, and vegetable and a meal earlier in the day). Patient reports the timing of this meal fluctuates based upon his schedule. He has been eating most of his meals in the hospital.   Weight has been stable over the past month. Patient reports he no longer takes ozempic , as he was losing too much weight when it was prescribed to him. He is unsure when he discontinued it.   Nutrition-Focused physical exam completed. Findings are no fat depletion, no muscle depletion, and mild edema.    Discussed importance of good meal and supplement intake to promote healing. He has not further questions of concerns regarding nutrition care plan, but expressed appreciation for visit.   Medications reviewed and include protonix .   Lab Results  Component Value Date   HGBA1C 5.9 (H) 04/30/2024   PTA DM medications are 1 mg semaglutide  (no longer taking) and 500 mg  metformin  daily.   Labs reviewed: CBGS: 79-196 (inpatient orders for glycemic control are 0-15 units insulin  aspart daily at bedtime and 0-5 units insulin  aspart daily at bedtime).    Body mass index is 39.32 kg/m. Patient meets criteria for obesity, class II based on current BMI. Obesity is a complex, chronic medical condition that is optimally managed by a multidisciplinary care team. Weight loss is not an ideal goal for an acute inpatient hospitalization. However, if further work-up for obesity is warranted, consider outpatient referral to [place].    Current diet order is regular, patient is consuming approximately 100% of meals at this time. Labs and medications reviewed.   No nutrition interventions warranted at this time. If nutrition issues arise, please consult RD.   Margery ORN, RD, LDN, CDCES Registered Dietitian III Certified Diabetes Care and Education Specialist If unable to reach this RD, please use RD Inpatient group chat on secure chat between hours of 8am-4 pm daily

## 2024-05-02 NOTE — Progress Notes (Signed)
 Patient removed tele to dress. MD aware and okay with leaving if off until discharge.

## 2024-05-02 NOTE — ED Notes (Addendum)
 Pt assisted out of wheel chair and into the bed with 3 person assist. Pt appears weak on his feet. While assisting pt, pt's significant other in room dictating how to move pt. She also is complaining that the pt was just discharged today and that the nurse and doctors up there did not listen to his concerns of being discharged too early. She stated she has a meeting with the board tomorrow about same and requested to keep his DC paperwork so that we don't change any wording on papers relating to his prior DC.

## 2024-05-02 NOTE — Discharge Summary (Signed)
 Physician Discharge Summary   Patient: Roberto [name] MRN: [id number] DOB: 1955-10-03  Admit date:     04/30/2024  Discharge date: 05/02/24  Discharge Physician: Leita Blanch   PCP: [name], DO   Recommendations at discharge:    F/u PCP in 1-2 weeks  Discharge Diagnoses: Principal Problem:   Acute metabolic encephalopathy Active Problems:   Normocytic anemia   H/O mechanical aortic valve replacement   Hypokalemia   Type 2 diabetes mellitus with chronic kidney disease, without long-term current use of insulin  (HCC)   Gout   Depression   Paroxysmal atrial fibrillation (HCC)   Dyslipidemia   Essential hypertension   Roberto [name] is a 68 y.o. male  with medical history significant for essential hypertension, dyslipidemia, bicuspid aortic valve status post mechanical valve replacement on Lovenox ,, CVA/TIA, CKD stage IIIb, who was brought to the emergency department because of acute change in mental status.  Reportedly, patient was found outside the house disoriented.  There was also report of nausea and vomiting prior to admission but no diarrhea or abdominal pain.  CT head and CT cervical spine did not show any acute abnormality. Chest x-ray showed cardiomegaly with mild central vascular congestion   Urine toxicology screen positive for tricyclic's, MDMA (ecstasy) with an annotation that trazodone  may cause false positive MDMA.  Acute metabolic encephalopathy: Improved and he appears back to baseline.  Etiology unclear.    Hypokalemia: Potassium down from 2.9-2.8.  Replete potassium and monitor levels.    Acute on chronic hypotension: BP was down to 80/62.   --received IVF --Continue midodrine .  Increase the dose from 2.5 mg twice daily to 5 mg 3 times daily His partner confirmed that BP usually stays low, with systolic BP sometimes in the upper 80s or 90s.  She also confirmed that patient takes losartan , metoprolol  and torsemide  at home. --D/ced losartan , --resumed  metoprolol  and torsemide  for now with holding parameter --pt overall improved and feels well   Chronic systolic and diastolic CHF: Compensated Status mechanical aortic valve replacement for aortic stenosis, paroxysmal atrial fibrillation, NSVT: Continue therapeutic dose of Lovenox . --On amiodarone  for A-fib and mexiletine for ventricular tachycardia. --His significant other said patient has been on Lovenox  for about a month.  Warfarin was temporarily discontinued because his INR was all over the place because of poor oral intake and recurrent vomiting when he had issues with his esophagus.  His partner said that there are plans to take him off of Lovenox  and resume warfarin in the future. 2D echo in March 2025 showed EF estimated 25 to 30%, grade 2 diastolic dysfunction, mild to moderate MR, mild to moderate TR, repaired/replaced aortic valve.   CKD stage IIIb: Creatinine is at baseline.   Elevated liver enzymes: This appears to be chronic.  Patient has fatty liver.     Comorbidities include history of stroke/TIA, hyperlipidemia, aneurysm of ascending aorta  Patient is medically stable at baseline. He is eager to go home. His vitals remains stable he is asymptomatic. Will discharge to home.    Disposition: Home Diet recommendation:  Cardiac diet DISCHARGE MEDICATION: Allergies as of 05/02/2024       Reactions   Latex Hives   Nicotine  Hives, Rash   Patches caused localized rash and hives         Medication List     PAUSE taking these medications    metFORMIN  500 MG 24 hr tablet Wait to take this until your doctor or other care provider tells you to  start again. Commonly known as: GLUCOPHAGE -XR Take 1 tablet by mouth once daily with breakfast       STOP taking these medications    losartan  25 MG tablet Commonly known as: COZAAR    warfarin 3 MG tablet Commonly known as: COUMADIN        TAKE these medications    allopurinol  100 MG tablet Commonly known as:  ZYLOPRIM  Take 1 tablet (100 mg total) by mouth daily.   amiodarone  200 MG tablet Commonly known as: PACERONE  Take 2 tablets by mouth twice daily   baclofen  10 MG tablet Commonly known as: LIORESAL  Take 1 tablet (10 mg total) by mouth 2 (two) times daily. Take before food twice a day, stop if not helping   buPROPion  300 MG 24 hr tablet Commonly known as: WELLBUTRIN  XL Take 300 mg by mouth daily.   dapagliflozin  propanediol 10 MG Tabs tablet Commonly known as: Farxiga  Take 1 tablet (10 mg total) by mouth daily before breakfast.   enoxaparin  120 MG/0.8ML injection Commonly known as: LOVENOX  INJECT 0.72 ML (108MG ) INTO THE SKIN EVERY 12 HOURS   fenofibrate  48 MG tablet Commonly known as: TRICOR  Take 48 mg by mouth daily.   metoprolol  succinate 50 MG 24 hr tablet Commonly known as: TOPROL -XL Take 1 tablet (50 mg total) by mouth daily. Notes to patient: Hold if you feel dizzy and your SBP <90   mexiletine 150 MG capsule Commonly known as: MEXITIL  Take 150 mg by mouth 3 (three) times daily.   midodrine  2.5 MG tablet Commonly known as: PROAMATINE  Take 1 tablet (2.5 mg total) by mouth 2 (two) times daily with a meal.   nortriptyline  10 MG capsule Commonly known as: PAMELOR  Take 1 capsule by mouth at bedtime   OMEGA 3 PO Take 1,500 mg by mouth 2 (two) times daily.   ondansetron  4 MG disintegrating tablet Commonly known as: ZOFRAN -ODT Take 1 tablet (4 mg total) by mouth every 8 (eight) hours as needed for nausea or vomiting (before eating).   Ozempic  (1 MG/DOSE) 2 MG/1.5ML Sopn Generic drug: Semaglutide  (1 MG/DOSE) Inject 1 mg into the skin once a week.   pantoprazole  40 MG tablet Commonly known as: PROTONIX  Take 1 tablet (40 mg total) by mouth 2 (two) times daily. Then go back to once a day What changed: additional instructions   phosphorus 155-852-130 MG tablet Commonly known as: K PHOS NEUTRAL Take 2 tablets (500 mg total) by mouth 3 (three) times daily for 3  doses.   potassium chloride  10 MEQ CR capsule Commonly known as: MICRO-K  Take 1 capsule (10 mEq total) by mouth daily.   rosuvastatin  40 MG tablet Commonly known as: CRESTOR  Take 1 tablet (40 mg total) by mouth daily.   Spacer/Aero-Holding [employer] Use as directed Dx: COPD, J44.9   torsemide  20 MG tablet Commonly known as: DEMADEX  Take 1 tablet (20 mg total) by mouth daily.   traZODone  50 MG tablet Commonly known as: DESYREL  Take 0.5 tablets (25 mg total) by mouth at bedtime as needed for sleep.   Trelegy Ellipta  200-62.5-25 MCG/ACT Aepb Generic drug: Fluticasone -Umeclidin-Vilant Inhale 1 puff into the lungs daily in the afternoon.   Ventolin  HFA 108 (90 Base) MCG/ACT inhaler Generic drug: albuterol  INHALE 2 PUFFS BY MOUTH EVERY 6 HOURS AS NEEDED FOR WHEEZING OR SHORTNESS OF BREATH   albuterol  (2.5 MG/3ML) 0.083% nebulizer solution Commonly known as: PROVENTIL  USE 1 VIAL IN NEBULIZER EVERY 6 HOURS AS NEEDED FOR WHEEZING FOR SHORTNESS OF BREATH  Follow-up Information     Vicci Bouchard P, DO. Schedule an appointment as soon as possible for a visit in 1 week(s).   Specialty: Family Medicine Contact information: [address] [place] [address] [phone]                Discharge Exam: Fredricka Weights   04/30/24 1921  Weight: 110.5 kg   Alert and oriented times three respiratory clear to auscultation cardiovascular both heart sounds normal. Abdomen soft benign, obese Extremities  +edema  Condition at discharge: fair  The results of significant diagnostics from this hospitalization (including imaging, microbiology, ancillary and laboratory) are listed below for reference.   Imaging Studies: DG Chest Portable 1 View Result Date: 04/30/2024 CLINICAL DATA:  Weakness. EXAM: PORTABLE CHEST 1 VIEW COMPARISON:  Chest radiograph dated 02/08/2024. FINDINGS: There is cardiomegaly with mild central vascular congestion. No focal consolidation, pleural  effusion or pneumothorax. Left pectoral AICD device. No acute osseous pathology. Sternal fixation hardware. IMPRESSION: Cardiomegaly with mild central vascular congestion. Electronically Signed   By: Vanetta Chou M.D.   On: 04/30/2024 20:15   CT Head Wo Contrast Result Date: 04/30/2024 EXAM: CT HEAD AND CERVICAL SPINE 04/30/2024 07:39:30 PM TECHNIQUE: CT of the head and cervical spine was performed without the administration of intravenous contrast. Multiplanar reformatted images are provided for review. Automated exposure control, iterative reconstruction, and/or weight based adjustment of the mA/kV was utilized to reduce the radiation dose to as low as reasonably achievable. COMPARISON: None available. CLINICAL HISTORY: Head trauma, minor (Age >= 65y). RN notes: Pt arrived via EMS from home after wife drove him [place] the EMS base for AMS. EMS states wife said his last known well was this a.m. before she went to work and had minor hallucinations when she came home. FINDINGS: CT HEAD BRAIN AND VENTRICLES: No acute intracranial hemorrhage. No mass effect or midline shift. No abnormal extra-axial fluid collection. No evidence of acute infarct. No hydrocephalus. ORBITS: No acute abnormality. SINUSES AND MASTOIDS: No acute abnormality. SOFT TISSUES AND SKULL: No acute skull fracture. No acute soft tissue abnormality. CT CERVICAL SPINE BONES AND ALIGNMENT: No acute fracture or traumatic malalignment. DEGENERATIVE CHANGES: No significant degenerative changes. SOFT TISSUES: No prevertebral soft tissue swelling. IMPRESSION: 1. No acute intracranial abnormality. 2. No acute fracture or traumatic malalignment of the cervical spine. Electronically signed by: Dorethia Molt MD 04/30/2024 07:48 PM EDT RP Workstation: [id number]   CT Cervical Spine Wo Contrast Result Date: 04/30/2024 EXAM: CT HEAD AND CERVICAL SPINE 04/30/2024 07:39:30 PM TECHNIQUE: CT of the head and cervical spine was performed without the  administration of intravenous contrast. Multiplanar reformatted images are provided for review. Automated exposure control, iterative reconstruction, and/or weight based adjustment of the mA/kV was utilized to reduce the radiation dose to as low as reasonably achievable. COMPARISON: None available. CLINICAL HISTORY: Head trauma, minor (Age >= 65y). RN notes: Pt arrived via EMS from home after wife drove him to the EMS base for AMS. EMS states wife said his last known well was this a.m. before she went to work and had minor hallucinations when she came home. FINDINGS: CT HEAD BRAIN AND VENTRICLES: No acute intracranial hemorrhage. No mass effect or midline shift. No abnormal extra-axial fluid collection. No evidence of acute infarct. No hydrocephalus. ORBITS: No acute abnormality. SINUSES AND MASTOIDS: No acute abnormality. SOFT TISSUES AND SKULL: No acute skull fracture. No acute soft tissue abnormality. CT CERVICAL SPINE BONES AND ALIGNMENT: No acute fracture or traumatic malalignment. DEGENERATIVE CHANGES:  No significant degenerative changes. SOFT TISSUES: No prevertebral soft tissue swelling. IMPRESSION: 1. No acute intracranial abnormality. 2. No acute fracture or traumatic malalignment of the cervical spine. Electronically signed by: Dorethia Molt MD 04/30/2024 07:48 PM EDT RP Workstation: [id number]    Microbiology: Results for orders placed or performed during the hospital encounter of 08/21/23  Resp Panel by RT-PCR (Flu A&B, Covid) Anterior Nasal Swab     Status: None   Collection Time: 08/21/23  6:17 PM   Specimen: Anterior Nasal Swab  Result Value Ref Range Status   SARS Coronavirus 2 by RT PCR NEGATIVE NEGATIVE Final    Comment: (NOTE) SARS-CoV-2 target nucleic acids are NOT DETECTED.  The SARS-CoV-2 RNA is generally detectable in upper respiratory specimens during the acute phase of infection. The lowest concentration of SARS-CoV-2 viral copies this assay can detect is 138 copies/mL. A  negative result does not preclude SARS-Cov-2 infection and should not be used as the sole basis for treatment or other patient management decisions. A negative result may occur with  improper specimen collection/handling, submission of specimen other than nasopharyngeal swab, presence of viral mutation(s) within the areas targeted by this assay, and inadequate number of viral copies(<138 copies/mL). A negative result must be combined with clinical observations, patient history, and epidemiological information. The expected result is Negative.  Fact Sheet for Patients:  bloggercourse.com  Fact Sheet for Healthcare Providers:  seriousbroker.it  This test is no t yet approved or cleared by the [place]  FDA and  has been authorized for detection and/or diagnosis of SARS-CoV-2 by FDA under an Emergency Use Authorization (EUA). This EUA will remain  in effect (meaning this test can be used) for the duration of the COVID-19 declaration under Section 564(b)(1) of the Act, 21 U.S.C.section 360bbb-3(b)(1), unless the authorization is terminated  or revoked sooner.       Influenza A by PCR NEGATIVE NEGATIVE Final   Influenza B by PCR NEGATIVE NEGATIVE Final    Comment: (NOTE) The Xpert Xpress SARS-CoV-2/FLU/RSV plus assay is intended as an aid in the diagnosis of influenza from Nasopharyngeal swab specimens and should not be used as a sole basis for treatment. Nasal washings and aspirates are unacceptable for Xpert Xpress SARS-CoV-2/FLU/RSV testing.  Fact Sheet for Patients: bloggercourse.com  Fact Sheet for Healthcare Providers: seriousbroker.it  This test is not yet approved or cleared by the [place]  FDA and has been authorized for detection and/or diagnosis of SARS-CoV-2 by FDA under an Emergency Use Authorization (EUA). This EUA will remain in effect (meaning this test can  be used) for the duration of the COVID-19 declaration under Section 564(b)(1) of the Act, 21 U.S.C. section 360bbb-3(b)(1), unless the authorization is terminated or revoked.  Performed at [facility], [address] [place], [place] [address]    *Note: Due to a large number of results and/or encounters for the requested time period, some results have not been displayed. A complete set of results can be found in Results Review.    Labs: CBC: Recent Labs  Lab 04/30/24 1905 05/01/24 0424  WBC 10.9* 9.7  NEUTROABS 9.2*  --   HGB 10.2* 11.2*  HCT 32.2* 35.0*  MCV 90.7 89.5  PLT 182 209   Basic Metabolic Panel: Recent Labs  Lab 04/30/24 1905 04/30/24 1958 05/01/24 0424 05/02/24 0348  NA 138  --  140 139  K 2.9*  --  2.8* 4.4  CL 103  --  103 106  CO2 23  --  26 25  GLUCOSE 110*  --  89 96  BUN 25*  --  24* 25*  CREATININE 1.87*  --  1.79* 1.73*  CALCIUM  8.3*  --  9.0 9.0  MG  --  1.7 2.0 2.3  PHOS  --   --   --  1.9*   Liver Function Tests: Recent Labs  Lab 04/30/24 1905 05/02/24 0348  AST 50*  --   ALT 45*  --   ALKPHOS 38  --   BILITOT 0.8  --   PROT 6.0*  --   ALBUMIN 3.1* 3.2*   CBG: Recent Labs  Lab 05/01/24 1210 05/01/24 1710 05/01/24 2029 05/02/24 0055 05/02/24 0751  GLUCAP 122* 196* 96 91 79    Discharge time spent: greater than 30 minutes.  Signed: Leita Blanch, MD [facility] 05/02/2024

## 2024-05-02 NOTE — Progress Notes (Signed)
   05/02/24 0136  Provider Notification  Provider Name/Title Madison Peaches  Date Provider Notified 05/02/24  Time Provider Notified 0136  Method of Notification Page  Notification Reason Fall  Screening for Fall Injury Risk (To be completed on HIGH fall risk patients) - Assessing Need for Floor Mats  Risk For Fall Injury- Criteria for Floor Mats Previous fall this admission  Will Implement Floor Mats Yes  Oxygen Therapy  O2 Device Nasal Cannula  O2 Flow Rate (L/min) 4 L/min  Pain Assessment  Pain Scale 0-10  Pain Score 0  Neurological  Neuro (WDL) WDL  Musculoskeletal  Musculoskeletal (WDL) WDL  Generalized Weakness Yes  Pain Screening  Clinical Progression Not changed

## 2024-05-02 NOTE — ED Triage Notes (Signed)
 See first nurse note.

## 2024-05-02 NOTE — ED Provider Notes (Signed)
 [facility] Provider Note    None    (approximate)   History   Fall    HPI  Roberto [name] is a 68 y.o. male    with a past medical history of acute blood loss anemia, ventricular tachycardia, hypokalemia, contusion of right knee, chronic systolic heart failure, type 2 diabetes, chronic kidney disease, liver disease, hypertension, who presents to the ED complaining of fall. According to the patient, he was using his walker and the front yard he tripped and fell on his back.  Patient denies loss of consciousness, or hitting his head..  Patient is using Lovenox  every 12 hours.  Patient denies urinary retention, urinary incontinence, fecal incontinence.  No numbness or tingling in his legs.  Patient is here with his wife.  Per independent chart review, patient was here this morning for disorientation, electrolytes within normal limits, BUN and creatinine elevated, phosphorus decreased, hyperbilirubinemia of 3.2, GFR decreased 42.    Patient Active Problem List   Diagnosis Date Noted   Normocytic anemia 05/01/2024   Gout 05/01/2024   Paroxysmal atrial fibrillation (HCC) 05/01/2024   Type 2 diabetes mellitus with chronic kidney disease, without long-term current use of insulin  (HCC) 05/01/2024   Dyslipidemia 05/01/2024   Essential hypertension 05/01/2024   Hypokalemia 05/01/2024   Acute metabolic encephalopathy 04/30/2024   Esophageal dysmotility 03/28/2024   Indigestion 03/24/2024   Unintentional weight loss 03/07/2024   Presence of automatic (implantable) cardiac defibrillator 03/07/2024   Presence of heart assist device (HCC) 02/15/2024   Polyp of colon 12/13/2023   PTSD (post-traumatic stress disorder) 11/15/2023   DM type 2 with diabetic peripheral neuropathy (HCC) 10/23/2023   Essential tremor 10/23/2023   Pacemaker 09/11/2023   Persistent atrial fibrillation (HCC) 07/12/2023   Acute on chronic systolic CHF (congestive heart failure) (HCC) 06/22/2023    VT (ventricular tachycardia) (HCC) 06/18/2023   Hypotension 06/18/2023   Atrial fibrillation (HCC) 06/17/2023   Depression 06/17/2023   Tremor of left hand 05/29/2022   Aneurysm of ascending aorta without rupture 02/17/2022   Anticoagulated on Coumadin  07/29/2021   Chronic kidney disease, stage 3a (HCC) 12/09/2020   Hyperlipidemia associated with type 2 diabetes mellitus (HCC) 12/09/2020   Coronary artery disease involving native coronary artery of native heart without angina pectoris 09/10/2019   Osteoarthritis of spine with radiculopathy, lumbar region 11/21/2018   History of aortic stenosis 06/05/2018   Chronic gout without tophus 04/03/2018   Valvular heart disease 01/17/2018   Morbid obesity (HCC) 01/17/2018   Chronic HFrEF (heart failure with reduced ejection fraction) (HCC) 09/27/2016   Renal infarct 05/01/2016   Type 2 diabetes mellitus with cardiac complication (HCC) 04/19/2016   Chronic bilateral low back pain without sciatica 04/19/2016   Thoracic aortic aneurysm without rupture 03/16/2016   Aortic atherosclerosis 03/16/2016   Nicotine  dependence, cigarettes, uncomplicated 03/14/2016   NICM (nonischemic cardiomyopathy) (HCC) 03/02/2016   COPD (chronic obstructive pulmonary disease) (HCC) 09/09/2015   H/O mechanical aortic valve replacement 02/09/2015   Hypertension associated with diabetes (HCC) 02/09/2015     Physical Exam   Triage Vital Signs: ED Triage Vitals  Encounter Vitals Group     BP 05/02/24 1741 95/72     Girls Systolic BP Percentile --      Girls Diastolic BP Percentile --      Boys Systolic BP Percentile --      Boys Diastolic BP Percentile --      Pulse Rate 05/02/24 1741 82     Resp  05/02/24 1741 18     Temp 05/02/24 1741 (!) 97.2 F (36.2 C)     Temp Source 05/02/24 1741 Oral     SpO2 05/02/24 1741 95 %     Weight --      Height --      Head Circumference --      Peak Flow --      Pain Score 05/02/24 1742 6     Pain Loc --      Pain  Education --      Exclude from Growth Chart --     Most recent vital signs: Vitals:   05/02/24 1917 05/02/24 1918  BP: (!) 133/120   Pulse:    Resp: 17   Temp: 98.8 F (37.1 C)   SpO2: 94% 94%     Physical Exam Vitals and nursing note reviewed.  During triage blood pressure heart rate and respiratory rate were normal  General:          Awake, no distress.  Oriented x 4 CV:                  Good peripheral perfusion.  Resp:               Normal effort. no tachypnea Abd:                Bowel sounds positive skin evidence of ecchymosis and hematomas due to Lovenox  injections.  No distention.  Soft nontender Other: Lumbar spine: Skin is intact, no ecchymosis, no hematomas.  Tenderness to palpation in midline, spinal process..  No signs of saddle anesthesia.  Sensation is intact.  Pulses positive.              ED Results / Procedures / Treatments   Labs (all labs ordered are listed, but only abnormal results are displayed) Labs Reviewed - No data to display     RADIOLOGY I independently reviewed and interpreted imaging and agree with radiologists findings.      PROCEDURES:  Critical Care performed:   Procedures   MEDICATIONS ORDERED IN ED: Medications  lidocaine  (LIDODERM ) 5 % 1 patch (1 patch Transdermal Patch Applied 05/02/24 1924)  traMADol (ULTRAM) tablet 50 mg (50 mg Oral Given 05/02/24 1924)   Clinical Course as of 05/02/24 1957  Fri May 02, 2024  1907 DG Lumbar Spine Complete  Minimal anterolisthesis at L4-5 due to posterior facet arthropathy. 2. Mild multilevel degenerative disc disease at L1-2, L2-3, and L3-4.   [AE]  [id number] Updated patient with results of x-ray.  Patient is getting lidocaine  patch and tramadol.  He is wife is requesting wheelchair.  I did explain to her PCP is who can prescribe a wheelchair [AE]  1953 Reassessed the patient, he endorses feeling better.  Ready to go home.  His wife states they did not know about he has lumbar condition seen  on x-ray.  Patient is going to be referred to orthopedics for follow-up [AE]    Clinical Course User Index [AE] Janit Kast, [name]    IMPRESSION / MDM / ASSESSMENT AND PLAN / ED COURSE  I reviewed the triage vital signs and the nursing notes.  Differential diagnosis includes, but is not limited to, lumbar soft tissue injury, lumbar fracture, lumbar subluxation, likely hip fracture.  Patient's presentation is most consistent with acute complicated illness / injury requiring diagnostic workup.   Johney Vidana is a 68 y.o., male was brought today by his wife after falling on his lumbar  back while using walker at his house.  No loss of consciousness.  On a physical exam lumbar spine skin is intact, no ecchymosis, no hematomas.  Tenderness to palpation in the midline of the spinal process.  No signs of saddle anesthesia. Presence of ecchymosis or hematomas in abdominal skin related to Lovenox  injections. Plan With results of x-ray Tramadol Lidocaine  patch Patient's diagnosis is consistent with lumbar soft tissue injury.  I independently reviewed and interpreted imaging and agree with radiologists findings no fracture or subluxation.  I did not order any labs, patient was seen this morning here and labs look within normal limits. I did review the patient's allergies and medications.The patient is in stable and satisfactory condition for discharge home  Patient will be discharged home with prescriptions for tramadol, lidocaine  patch. Patient is to follow up with orthopedics as needed or otherwise directed. Patient is given ED precautions to return to the ED for any worsening or new symptoms. Discussed plan of care with patient, answered all of patient's questions, Patient agreeable to plan of care. Advised patient to take medications according to the instructions on the label. Discussed possible side effects of new medications. Patient verbalized understanding.  FINAL CLINICAL IMPRESSION(S) /  ED DIAGNOSES   Final diagnoses:  Fall, initial encounter  Lumbar back pain     Rx / DC Orders   ED Discharge Orders          Ordered    traMADol (ULTRAM) 50 MG tablet  Every 6 hours PRN        05/02/24 1956    lidocaine  (LIDODERM ) 5 %  Every 12 hours        05/02/24 1956             Note:  This document was prepared using Dragon voice recognition software and may include unintentional dictation errors.   Janit Kast, [name] 05/02/24 LEANOR    Dorothyann Drivers, MD 05/06/24 1435

## 2024-05-02 NOTE — ED Notes (Signed)
 Attempted to help patient with urinal however he wanted girlfriend to help and for nurse to leave the room.

## 2024-05-02 NOTE — Plan of Care (Signed)

## 2024-05-03 ENCOUNTER — Other Ambulatory Visit: Payer: Self-pay | Admitting: Family Medicine

## 2024-05-03 LAB — HAPTOGLOBIN: Haptoglobin: 117 mg/dL (ref 32–363)

## 2024-05-05 ENCOUNTER — Emergency Department

## 2024-05-05 ENCOUNTER — Observation Stay (HOSPITAL_BASED_OUTPATIENT_CLINIC_OR_DEPARTMENT_OTHER)
Admit: 2024-05-05 | Discharge: 2024-05-05 | Disposition: A | Discharge status: Home / Self Care | Attending: Physician Assistant | Admitting: Physician Assistant

## 2024-05-05 ENCOUNTER — Other Ambulatory Visit: Payer: Self-pay

## 2024-05-05 ENCOUNTER — Inpatient Hospital Stay
Admission: EM | Admit: 2024-05-05 | Discharge: 2024-05-09 | DRG: 291 | Disposition: A | Discharge status: Home / Self Care | Attending: Hospitalist | Admitting: Hospitalist

## 2024-05-05 DIAGNOSIS — H9190 Unspecified hearing loss, unspecified ear: Secondary | ICD-10-CM | POA: Diagnosis present

## 2024-05-05 DIAGNOSIS — J9601 Acute respiratory failure with hypoxia: Secondary | ICD-10-CM | POA: Diagnosis present

## 2024-05-05 DIAGNOSIS — I5082 Biventricular heart failure: Secondary | ICD-10-CM | POA: Diagnosis present

## 2024-05-05 DIAGNOSIS — E1122 Type 2 diabetes mellitus with diabetic chronic kidney disease: Secondary | ICD-10-CM | POA: Diagnosis present

## 2024-05-05 DIAGNOSIS — R7989 Other specified abnormal findings of blood chemistry: Secondary | ICD-10-CM

## 2024-05-05 DIAGNOSIS — F17210 Nicotine dependence, cigarettes, uncomplicated: Secondary | ICD-10-CM | POA: Diagnosis present

## 2024-05-05 DIAGNOSIS — I081 Rheumatic disorders of both mitral and tricuspid valves: Secondary | ICD-10-CM | POA: Diagnosis present

## 2024-05-05 DIAGNOSIS — I2510 Atherosclerotic heart disease of native coronary artery without angina pectoris: Secondary | ICD-10-CM | POA: Diagnosis present

## 2024-05-05 DIAGNOSIS — G9340 Encephalopathy, unspecified: Secondary | ICD-10-CM | POA: Diagnosis present

## 2024-05-05 DIAGNOSIS — I5023 Acute on chronic systolic (congestive) heart failure: Secondary | ICD-10-CM | POA: Diagnosis not present

## 2024-05-05 DIAGNOSIS — I472 Ventricular tachycardia, unspecified: Secondary | ICD-10-CM | POA: Diagnosis present

## 2024-05-05 DIAGNOSIS — I130 Hypertensive heart and chronic kidney disease with heart failure and stage 1 through stage 4 chronic kidney disease, or unspecified chronic kidney disease: Principal | ICD-10-CM | POA: Diagnosis present

## 2024-05-05 DIAGNOSIS — Z6836 Body mass index (BMI) 36.0-36.9, adult: Secondary | ICD-10-CM

## 2024-05-05 DIAGNOSIS — Z8249 Family history of ischemic heart disease and other diseases of the circulatory system: Secondary | ICD-10-CM

## 2024-05-05 DIAGNOSIS — Z59868 Other specified financial insecurity: Secondary | ICD-10-CM

## 2024-05-05 DIAGNOSIS — I7122 Aneurysm of the aortic arch, without rupture: Secondary | ICD-10-CM

## 2024-05-05 DIAGNOSIS — I428 Other cardiomyopathies: Secondary | ICD-10-CM | POA: Diagnosis present

## 2024-05-05 DIAGNOSIS — Z7951 Long term (current) use of inhaled steroids: Secondary | ICD-10-CM

## 2024-05-05 DIAGNOSIS — N1831 Chronic kidney disease, stage 3a: Secondary | ICD-10-CM | POA: Diagnosis present

## 2024-05-05 DIAGNOSIS — N1832 Chronic kidney disease, stage 3b: Secondary | ICD-10-CM | POA: Diagnosis present

## 2024-05-05 DIAGNOSIS — I272 Pulmonary hypertension, unspecified: Secondary | ICD-10-CM | POA: Diagnosis present

## 2024-05-05 DIAGNOSIS — I361 Nonrheumatic tricuspid (valve) insufficiency: Secondary | ICD-10-CM | POA: Diagnosis not present

## 2024-05-05 DIAGNOSIS — Z91040 Latex allergy status: Secondary | ICD-10-CM

## 2024-05-05 DIAGNOSIS — R7401 Elevation of levels of liver transaminase levels: Secondary | ICD-10-CM

## 2024-05-05 DIAGNOSIS — Z5982 Transportation insecurity: Secondary | ICD-10-CM

## 2024-05-05 DIAGNOSIS — R4182 Altered mental status, unspecified: Principal | ICD-10-CM

## 2024-05-05 DIAGNOSIS — G9341 Metabolic encephalopathy: Secondary | ICD-10-CM | POA: Diagnosis present

## 2024-05-05 DIAGNOSIS — N179 Acute kidney failure, unspecified: Secondary | ICD-10-CM | POA: Diagnosis present

## 2024-05-05 DIAGNOSIS — I712 Thoracic aortic aneurysm, without rupture, unspecified: Secondary | ICD-10-CM | POA: Diagnosis present

## 2024-05-05 DIAGNOSIS — Z7984 Long term (current) use of oral hypoglycemic drugs: Secondary | ICD-10-CM

## 2024-05-05 DIAGNOSIS — Z833 Family history of diabetes mellitus: Secondary | ICD-10-CM

## 2024-05-05 DIAGNOSIS — J810 Acute pulmonary edema: Secondary | ICD-10-CM | POA: Diagnosis not present

## 2024-05-05 DIAGNOSIS — R296 Repeated falls: Secondary | ICD-10-CM | POA: Diagnosis present

## 2024-05-05 DIAGNOSIS — E876 Hypokalemia: Secondary | ICD-10-CM | POA: Diagnosis present

## 2024-05-05 DIAGNOSIS — Z8673 Personal history of transient ischemic attack (TIA), and cerebral infarction without residual deficits: Secondary | ICD-10-CM

## 2024-05-05 DIAGNOSIS — K761 Chronic passive congestion of liver: Secondary | ICD-10-CM | POA: Diagnosis present

## 2024-05-05 DIAGNOSIS — I959 Hypotension, unspecified: Secondary | ICD-10-CM | POA: Diagnosis present

## 2024-05-05 DIAGNOSIS — I9589 Other hypotension: Secondary | ICD-10-CM | POA: Diagnosis present

## 2024-05-05 DIAGNOSIS — E7800 Pure hypercholesterolemia, unspecified: Secondary | ICD-10-CM | POA: Diagnosis present

## 2024-05-05 DIAGNOSIS — I4892 Unspecified atrial flutter: Secondary | ICD-10-CM | POA: Diagnosis present

## 2024-05-05 DIAGNOSIS — Z803 Family history of malignant neoplasm of breast: Secondary | ICD-10-CM

## 2024-05-05 DIAGNOSIS — Z79899 Other long term (current) drug therapy: Secondary | ICD-10-CM

## 2024-05-05 DIAGNOSIS — E872 Acidosis, unspecified: Secondary | ICD-10-CM | POA: Diagnosis present

## 2024-05-05 DIAGNOSIS — I4819 Other persistent atrial fibrillation: Secondary | ICD-10-CM | POA: Diagnosis present

## 2024-05-05 DIAGNOSIS — Z95810 Presence of automatic (implantable) cardiac defibrillator: Secondary | ICD-10-CM

## 2024-05-05 DIAGNOSIS — R9431 Abnormal electrocardiogram [ECG] [EKG]: Secondary | ICD-10-CM

## 2024-05-05 DIAGNOSIS — I5043 Acute on chronic combined systolic (congestive) and diastolic (congestive) heart failure: Secondary | ICD-10-CM | POA: Diagnosis present

## 2024-05-05 DIAGNOSIS — Z5948 Other specified lack of adequate food: Secondary | ICD-10-CM

## 2024-05-05 DIAGNOSIS — Z800 Family history of malignant neoplasm of digestive organs: Secondary | ICD-10-CM

## 2024-05-05 DIAGNOSIS — Z9109 Other allergy status, other than to drugs and biological substances: Secondary | ICD-10-CM

## 2024-05-05 DIAGNOSIS — Z8261 Family history of arthritis: Secondary | ICD-10-CM

## 2024-05-05 DIAGNOSIS — Z952 Presence of prosthetic heart valve: Secondary | ICD-10-CM

## 2024-05-05 DIAGNOSIS — Z5941 Food insecurity: Secondary | ICD-10-CM

## 2024-05-05 DIAGNOSIS — I5084 End stage heart failure: Secondary | ICD-10-CM | POA: Diagnosis present

## 2024-05-05 DIAGNOSIS — Z823 Family history of stroke: Secondary | ICD-10-CM

## 2024-05-05 DIAGNOSIS — I10 Essential (primary) hypertension: Secondary | ICD-10-CM | POA: Diagnosis present

## 2024-05-05 DIAGNOSIS — I509 Heart failure, unspecified: Secondary | ICD-10-CM

## 2024-05-05 DIAGNOSIS — J449 Chronic obstructive pulmonary disease, unspecified: Secondary | ICD-10-CM | POA: Diagnosis present

## 2024-05-05 LAB — URINALYSIS, W/ REFLEX TO CULTURE (INFECTION SUSPECTED)
Bacteria, UA: NONE SEEN
Bilirubin Urine: NEGATIVE
Glucose, UA: >=500 mg/dL — AB
Hgb urine dipstick: NEGATIVE
Ketones, ur: NEGATIVE mg/dL
Leukocytes,Ua: NEGATIVE
Nitrite: NEGATIVE
Protein, ur: NEGATIVE mg/dL
Specific Gravity, Urine: 1.006 (ref 1.005–1.030)
pH: 6 (ref 5.0–8.0)

## 2024-05-05 LAB — CBC WITH DIFFERENTIAL/PLATELET
Abs Immature Granulocytes: 0.03 K/uL (ref 0.00–0.07)
Basophils Absolute: 0 K/uL (ref 0.0–0.1)
Basophils Relative: 0 %
Eosinophils Absolute: 0 K/uL (ref 0.0–0.5)
Eosinophils Relative: 0 %
HCT: 40.7 % (ref 39.0–52.0)
Hemoglobin: 13.1 g/dL (ref 13.0–17.0)
Immature Granulocytes: 0 %
Lymphocytes Relative: 9 %
Lymphs Abs: 0.6 K/uL — ABNORMAL LOW (ref 0.7–4.0)
MCH: 29 pg (ref 26.0–34.0)
MCHC: 32.2 g/dL (ref 30.0–36.0)
MCV: 90 fL (ref 80.0–100.0)
Monocytes Absolute: 0.5 K/uL (ref 0.1–1.0)
Monocytes Relative: 8 %
Neutro Abs: 6 K/uL (ref 1.7–7.7)
Neutrophils Relative %: 83 %
Platelets: 258 K/uL (ref 150–400)
RBC: 4.52 MIL/uL (ref 4.22–5.81)
RDW: 17.2 % — ABNORMAL HIGH (ref 11.5–15.5)
WBC: 7.2 K/uL (ref 4.0–10.5)
nRBC: 0 % (ref 0.0–0.2)

## 2024-05-05 LAB — COMPREHENSIVE METABOLIC PANEL WITH GFR
ALT: 119 U/L — ABNORMAL HIGH (ref 0–44)
AST: 209 U/L — ABNORMAL HIGH (ref 15–41)
Albumin: 3.2 g/dL — ABNORMAL LOW (ref 3.5–5.0)
Alkaline Phosphatase: 63 U/L (ref 38–126)
Anion gap: 17 — ABNORMAL HIGH (ref 5–15)
BUN: 25 mg/dL — ABNORMAL HIGH (ref 8–23)
CO2: 20 mmol/L — ABNORMAL LOW (ref 22–32)
Calcium: 8.8 mg/dL — ABNORMAL LOW (ref 8.9–10.3)
Chloride: 99 mmol/L (ref 98–111)
Creatinine, Ser: 2.09 mg/dL — ABNORMAL HIGH (ref 0.61–1.24)
GFR, Estimated: 34 mL/min — ABNORMAL LOW (ref >60)
Glucose, Bld: 125 mg/dL — ABNORMAL HIGH (ref 70–99)
Potassium: 3.8 mmol/L (ref 3.5–5.1)
Sodium: 136 mmol/L (ref 135–145)
Total Bilirubin: 1.2 mg/dL (ref 0.0–1.2)
Total Protein: 7.2 g/dL (ref 6.5–8.1)

## 2024-05-05 LAB — BLOOD GAS, VENOUS
Acid-base deficit: 1.5 mmol/L (ref 0.0–2.0)
Bicarbonate: 23.7 mmol/L (ref 20.0–28.0)
O2 Saturation: 45.5 %
Patient temperature: 37
pCO2, Ven: 41 mmHg — ABNORMAL LOW (ref 44–60)
pH, Ven: 7.37 (ref 7.25–7.43)
pO2, Ven: 33 mmHg (ref 32–45)

## 2024-05-05 LAB — MAGNESIUM: Magnesium: 1.8 mg/dL (ref 1.7–2.4)

## 2024-05-05 LAB — RESP PANEL BY RT-PCR (RSV, FLU A&B, COVID)  RVPGX2
Influenza A by PCR: NEGATIVE
Influenza B by PCR: NEGATIVE
Resp Syncytial Virus by PCR: NEGATIVE
SARS Coronavirus 2 by RT PCR: NEGATIVE

## 2024-05-05 LAB — HEPATITIS PANEL, ACUTE
HCV Ab: NONREACTIVE
Hep A IgM: NONREACTIVE
Hep B C IgM: NONREACTIVE
Hepatitis B Surface Ag: NONREACTIVE

## 2024-05-05 LAB — TROPONIN I (HIGH SENSITIVITY)
Troponin I (High Sensitivity): 44 ng/L — ABNORMAL HIGH (ref <18)
Troponin I (High Sensitivity): 37 ng/L — ABNORMAL HIGH (ref <18)

## 2024-05-05 LAB — CK: Total CK: 241 U/L (ref 49–397)

## 2024-05-05 LAB — ECHOCARDIOGRAM LIMITED
Calc EF: 19.6 %
Est EF: <20
S' Lateral: 6.2 cm
Single Plane A2C EF: 18.7 %
Single Plane A4C EF: 22.9 %

## 2024-05-05 LAB — GLUCOSE, CAPILLARY: Glucose-Capillary: 82 mg/dL (ref 70–99)

## 2024-05-05 LAB — T4, FREE: Free T4: 1.3 ng/dL — ABNORMAL HIGH (ref 0.61–1.12)

## 2024-05-05 LAB — CBG MONITORING, ED
Glucose-Capillary: 126 mg/dL — ABNORMAL HIGH (ref 70–99)
Glucose-Capillary: 64 mg/dL — ABNORMAL LOW (ref 70–99)
Glucose-Capillary: 78 mg/dL (ref 70–99)

## 2024-05-05 LAB — PROTIME-INR
INR: 1.5 — ABNORMAL HIGH (ref 0.8–1.2)
Prothrombin Time: 18.6 s — ABNORMAL HIGH (ref 11.4–15.2)

## 2024-05-05 LAB — LACTIC ACID, PLASMA
Lactic Acid, Venous: 3.5 mmol/L (ref 0.5–1.9)
Lactic Acid, Venous: 2 mmol/L (ref 0.5–1.9)

## 2024-05-05 LAB — CORTISOL: Cortisol, Plasma: 22.7 ug/dL

## 2024-05-05 LAB — BRAIN NATRIURETIC PEPTIDE: B Natriuretic Peptide: 470.9 pg/mL — ABNORMAL HIGH (ref 0.0–100.0)

## 2024-05-05 LAB — TSH: TSH: 7.753 u[IU]/mL — ABNORMAL HIGH (ref 0.350–4.500)

## 2024-05-05 LAB — AMMONIA: Ammonia: 24 umol/L (ref 9–35)

## 2024-05-05 LAB — ETHANOL: Alcohol, Ethyl (B): <15 mg/dL (ref <15)

## 2024-05-05 MED ORDER — METOPROLOL SUCCINATE ER 50 MG PO TB24: 50.0000 mg | ORAL_TABLET | Freq: Every day | ORAL | Status: DC

## 2024-05-05 MED ORDER — ONDANSETRON HCL 4 MG/2ML IJ SOLN: 4.0000 mg | Freq: Four times a day (QID) | INTRAMUSCULAR | Status: DC | PRN

## 2024-05-05 MED ORDER — SODIUM CHLORIDE 0.9 % IV BOLUS: 250.0000 mL | Freq: Once | INTRAVENOUS | Status: DC

## 2024-05-05 MED ORDER — ONDANSETRON HCL 4 MG PO TABS: 4.0000 mg | ORAL_TABLET | Freq: Four times a day (QID) | ORAL | Status: DC | PRN

## 2024-05-05 MED ORDER — BUPROPION HCL ER (XL) 150 MG PO TB24
300.0000 mg | ORAL_TABLET | Freq: Every day | ORAL | Status: DC
  Administered 2024-05-05 – 2024-05-09 (×5): 300 mg via ORAL
  Filled 2024-05-05 (×5): qty 2

## 2024-05-05 MED ORDER — PROCHLORPERAZINE EDISYLATE 10 MG/2ML IJ SOLN
5.0000 mg | Freq: Four times a day (QID) | INTRAMUSCULAR | Status: DC | PRN
  Administered 2024-05-08: 5 mg via INTRAVENOUS
  Filled 2024-05-05 (×2): qty 1

## 2024-05-05 MED ORDER — LIDOCAINE 5 % EX PTCH
1.0000 | MEDICATED_PATCH | CUTANEOUS | Status: DC
  Administered 2024-05-05 – 2024-05-09 (×5): 1 via TRANSDERMAL
  Filled 2024-05-05 (×5): qty 1

## 2024-05-05 MED ORDER — FUROSEMIDE 10 MG/ML IJ SOLN
40.0000 mg | Freq: Two times a day (BID) | INTRAMUSCULAR | Status: DC
  Administered 2024-05-06 – 2024-05-09 (×6): 40 mg via INTRAVENOUS
  Filled 2024-05-05 (×7): qty 4

## 2024-05-05 MED ORDER — ENOXAPARIN SODIUM 100 MG/ML IJ SOSY
1.0000 mg/kg | PREFILLED_SYRINGE | Freq: Two times a day (BID) | INTRAMUSCULAR | Status: DC
  Administered 2024-05-05 – 2024-05-09 (×9): 90 mg via SUBCUTANEOUS
  Filled 2024-05-05 (×9): qty 1

## 2024-05-05 MED ORDER — MIDODRINE HCL 5 MG PO TABS
5.0000 mg | ORAL_TABLET | Freq: Three times a day (TID) | ORAL | Status: DC
  Administered 2024-05-05 (×2): 5 mg via ORAL
  Filled 2024-05-05 (×2): qty 1

## 2024-05-05 MED ORDER — AMIODARONE HCL 200 MG PO TABS
400.0000 mg | ORAL_TABLET | Freq: Two times a day (BID) | ORAL | Status: DC
Start: 2024-05-05 — End: 2024-05-05

## 2024-05-05 MED ORDER — MEXILETINE HCL 150 MG PO CAPS
150.0000 mg | ORAL_CAPSULE | Freq: Three times a day (TID) | ORAL | Status: DC
  Administered 2024-05-05 – 2024-05-09 (×12): 150 mg via ORAL
  Filled 2024-05-05 (×14): qty 1

## 2024-05-05 MED ORDER — MORPHINE SULFATE (PF) 2 MG/ML IV SOLN
2.0000 mg | INTRAVENOUS | Status: DC | PRN
  Administered 2024-05-05 – 2024-05-06 (×2): 2 mg via INTRAVENOUS
  Filled 2024-05-05 (×2): qty 1

## 2024-05-05 MED ORDER — ALLOPURINOL 100 MG PO TABS
100.0000 mg | ORAL_TABLET | Freq: Every day | ORAL | Status: DC
Start: 2024-05-05 — End: 2024-05-09
  Administered 2024-05-05 – 2024-05-09 (×5): 100 mg via ORAL
  Filled 2024-05-05 (×5): qty 1

## 2024-05-05 MED ORDER — INSULIN ASPART 100 UNIT/ML IJ SOLN
0.0000 [IU] | Freq: Three times a day (TID) | INTRAMUSCULAR | Status: DC
  Administered 2024-05-06: 1 [IU] via SUBCUTANEOUS
  Filled 2024-05-05: qty 1

## 2024-05-05 MED ORDER — PROCHLORPERAZINE MALEATE 5 MG PO TABS: 5.0000 mg | ORAL_TABLET | Freq: Four times a day (QID) | ORAL | Status: DC | PRN

## 2024-05-05 MED ORDER — MIDODRINE HCL 5 MG PO TABS
10.0000 mg | ORAL_TABLET | Freq: Three times a day (TID) | ORAL | Status: DC
  Administered 2024-05-05 – 2024-05-09 (×14): 10 mg via ORAL
  Filled 2024-05-05 (×15): qty 2

## 2024-05-05 MED ORDER — FENOFIBRATE 54 MG PO TABS
54.0000 mg | ORAL_TABLET | Freq: Every day | ORAL | Status: DC
  Administered 2024-05-05 – 2024-05-06 (×2): 54 mg via ORAL
  Filled 2024-05-05 (×2): qty 1

## 2024-05-05 MED ORDER — PANTOPRAZOLE SODIUM 40 MG PO TBEC
40.0000 mg | DELAYED_RELEASE_TABLET | Freq: Two times a day (BID) | ORAL | Status: DC
  Administered 2024-05-05 – 2024-05-09 (×9): 40 mg via ORAL
  Filled 2024-05-05 (×9): qty 1

## 2024-05-05 MED ORDER — BUDESON-GLYCOPYRROL-FORMOTEROL 160-9-4.8 MCG/ACT IN AERO
2.0000 | INHALATION_SPRAY | Freq: Two times a day (BID) | RESPIRATORY_TRACT | Status: DC
  Administered 2024-05-05 – 2024-05-09 (×8): 2 via RESPIRATORY_TRACT
  Filled 2024-05-05 (×2): qty 5.9

## 2024-05-05 MED ORDER — FUROSEMIDE 10 MG/ML IJ SOLN
40.0000 mg | Freq: Once | INTRAMUSCULAR | Status: DC
  Administered 2024-05-05: 40 mg via INTRAVENOUS
  Filled 2024-05-05: qty 4

## 2024-05-05 NOTE — Assessment & Plan Note (Addendum)
 Positive generalized confusion on presentation-?  Subacute versus chronic issue Noted similar presentation on most recent admission Unclear etiology at present Hypoxia and hypotension may be confounding issues Some concern for underlying dementia that has not been yet diagnosed CT head stable apart from global cerebral atrophy Ammonia level within normal limits Hold offending medication including trazodone , tramadol and pamelor   Monitor with treatment

## 2024-05-05 NOTE — Evaluation (Signed)
 Occupational Therapy Evaluation Patient Details Name: Roberto [name] MRN: [id number] DOB: [birth year] Today's Date: 05/05/2024   History of Present Illness   Pt is a 68 y.o. male here with altered mental status, encephalopathy, acute on chronic HFrEF, acute respiratory failure with hypoxia. Seen in ED 10/31 after a fall. Anticipate ICU admit for low BP. PMH of mechanical heart valve on anticoagulation, heart failure, type II diabetic, CKD, liver disease, hypertension, dyslipidemia, CVA/TIA, chronic HFrEF, s/p ICD.     Clinical Impressions Pt was seen for OT evaluation this date. PTA he resides in a 2 level home with 1.5 STE with a roommate. At baseline he is MOD I/IND with ADLs and has assist from roommate for his IADLs, e.g. cooking, laundry and managing meds. He has had 3 falls in the last 6 months. Reports use of a RW for all ambulation since August 2025 after ICD replacement ambulating household distances.   Pt presents with deficits in strength and activity tolerance, affecting safe and optimal ADL completion. Pt currently requires MOD I for bed mobility and CGA for STS and LB dressing to donn his shorts. VSS throughout session on 2L 02. He ambulated throughout ED hallways with CGA x1 and SBA x1 for lines/leads management d/t reports of RLE buckling unexpectedly at home leading to falls. Reports moderate soreness in his lower back and R knee from recent fall. Pt appears close to his baseline, but will follow acutely to prevent further decline/weakness. No OT follow up indicated on DC.      If plan is discharge home, recommend the following:   A little help with walking and/or transfers;A little help with bathing/dressing/bathroom;Help with stairs or ramp for entrance     Functional Status Assessment   Patient has had a recent decline in their functional status and demonstrates the ability to make significant improvements in function in a reasonable and predictable amount of time.      Equipment Recommendations   None recommended by OT     Recommendations for Other Services         Precautions/Restrictions   Precautions Precautions: Fall Recall of Precautions/Restrictions: Intact Restrictions Weight Bearing Restrictions Per Provider Order: No     Mobility Bed Mobility Overal bed mobility: Modified Independent                  Transfers Overall transfer level: Needs assistance Equipment used: Rolling walker (2 wheels) Transfers: Sit to/from Stand Sit to Stand: Contact guard assist           General transfer comment: cues for hand placement, pacing; ambulated with 2nd assist for lines/leads management and chair follow d/t pt reports of R knee buckling unexpected however no issues during evaluation      Balance Overall balance assessment: Needs assistance Sitting-balance support: Bilateral upper extremity supported, Feet supported Sitting balance-Leahy Scale: Good     Standing balance support: No upper extremity supported, During functional activity Standing balance-Leahy Scale: Fair Standing balance comment: donned pants in standing without AD                           ADL either performed or assessed with clinical judgement   ADL Overall ADL's : Needs assistance/impaired                     Lower Body Dressing: Contact guard assist;Sitting/lateral leans;Sit to/from stand Lower Body Dressing Details (indicate cue type and reason): to donn shorts seated via  figure four and standing at EOB             Functional mobility during ADLs: Supervision/safety;Contact guard assist;Rolling walker (2 wheels)       Vision         Perception         Praxis         Pertinent Vitals/Pain Pain Assessment Pain Assessment: 0-10 Pain Score: 7  Pain Location: lower back Pain Descriptors / Indicators: Sore Pain Intervention(s): Repositioned, Monitored during session     Extremity/Trunk Assessment Upper  Extremity Assessment Upper Extremity Assessment: Overall WFL for tasks assessed   Lower Extremity Assessment Lower Extremity Assessment: Generalized weakness   Cervical / Trunk Assessment Cervical / Trunk Assessment: Normal   Communication Communication Communication: No apparent difficulties   Cognition Arousal: Alert Behavior During Therapy: WFL for tasks assessed/performed Cognition: No apparent impairments                                       Cueing  General Comments   Cueing Techniques: Verbal cues  sp02 100% on 2L throughout ambulation   Exercises Other Exercises Other Exercises: Edu on role of OT in acute setting.   Shoulder Instructions      Home Living Family/patient expects to be discharged to:: Private residence Living Arrangements: Spouse/significant other Available Help at Discharge: Family;Available PRN/intermittently Type of Home: House Home Access: Stairs to enter [employer] of Steps: 1 Entrance Stairs-Rails: None Home Layout: Two level;Able to live on main level with bedroom/bathroom     Bathroom Shower/Tub: Sponge bathes at baseline   Bathroom Toilet: Standard     Home Equipment: [employer] (2 wheels)          Prior Functioning/Environment Prior Level of Function : Independent/Modified Independent;History of Falls (last six months)             Mobility Comments: Since Aug when he had his pacemaker replaced he has utilized a RW; 3 falls in ther last 6 months--fell off steps, and 2 in the yard ADLs Comments: friend/roommate cooks, does laundry, helps with meds; pt mostly IND with ADLs    OT Problem List: Decreased strength;Decreased activity tolerance   OT Treatment/Interventions: Self-care/ADL training;Therapeutic exercise;Therapeutic activities;Energy conservation;DME and/or AE instruction;Patient/family education;Balance training      OT Goals(Current goals can be found in the care plan section)    Acute Rehab OT Goals Patient Stated Goal: return home OT Goal Formulation: With patient/family Time For Goal Achievement: 05/19/24 Potential to Achieve Goals: Good ADL Goals Pt Will Perform Lower Body Dressing: with modified independence;sit to/from stand;sitting/lateral leans Pt Will Transfer to Toilet: with modified independence;ambulating Additional ADL Goal #1: Pt will demo implementation of 1 learned ECS during ADL performance 2/2 trials to maximize IND/safety and prevent overexertion.   OT Frequency:  Min 1X/week    Co-evaluation              AM-PAC OT 6 Clicks Daily Activity     Outcome Measure Help from another person eating meals?: None Help from another person taking care of personal grooming?: None Help from another person toileting, which includes using toliet, bedpan, or urinal?: A Little Help from another person bathing (including washing, rinsing, drying)?: A Little Help from another person to put on and taking off regular upper body clothing?: None Help from another person to put on and taking off regular lower body clothing?: A  Little 6 Click Score: 21   End of Session Equipment Utilized During Treatment: Gait belt;Rolling walker (2 wheels);Oxygen Nurse Communication: Mobility status  Activity Tolerance: Patient tolerated treatment well Patient left: in bed;with call bell/phone within reach;with bed alarm set;with family/visitor present  OT Visit Diagnosis: Other abnormalities of gait and mobility (R26.89)                Time: 1353-1420 OT Time Calculation (min): 27 min Charges:  OT General Charges $OT Visit: 1 Visit OT Evaluation $OT Eval Low Complexity: 1 Low Jerrell Hart, OTR/L  05/05/24, 4:09 PM  Amberley Hamler E Raiden Yearwood 05/05/2024, 4:06 PM

## 2024-05-05 NOTE — Assessment & Plan Note (Signed)
 Noted decompensated resp status requiring supplemental O2  Does not appear to be in active COPD exacerbation  Prn duonebs and trelegy  Continue with supplemental O2  Reassess as appropriate

## 2024-05-05 NOTE — ED Notes (Signed)
 EDP at bedside

## 2024-05-05 NOTE — Assessment & Plan Note (Signed)
 QTc in 640s on presentation  Correct electroltyes  Hold offendig medications  Follow up cardiology recommendations

## 2024-05-05 NOTE — ED Provider Notes (Addendum)
 [facility] Provider Note    Event Date/Time   First MD Initiated Contact with Patient 05/05/24 [phone]     (approximate)   History   Altered Mental Status   HPI  Roberto [name] is a 68 y.o. male   Past medical history of mechanical heart valve on anticoagulation, heart failure, type II diabetic, CKD, liver disease, hypertension here with altered mental status.  Apparently has been more confused today.  Of note was hospitalized last week for the similar, with no clear etiology and was back to baseline at discharge.  Also of note was seen in the emergency department on 05/02/2024 with a fall, unremarkable workup and discharged.  No reported trauma in the interim.  Patient with no medical complaints.  Specifically on review of systems denying headache, trauma, chest pain, shortness of breath, fevers, chills, GI or GU symptoms.  Denies drug or alcohol use.   External Medical Documents Reviewed: Hospital notes from earlier this month.  Then also noting hypotension, on midodrine  5 mg 3 times daily with a normal blood pressure in the 80s to 90s systolic.      Physical Exam   Triage Vital Signs: ED Triage Vitals  Encounter Vitals Group     BP 05/05/24 0051 (!) 70/29     Girls Systolic BP Percentile --      Girls Diastolic BP Percentile --      Boys Systolic BP Percentile --      Boys Diastolic BP Percentile --      Pulse Rate 05/05/24 0051 (!) 45     Resp 05/05/24 0055 (!) 25     Temp --      Temp src --      SpO2 05/05/24 0051 (!) 86 %     Weight --      Height --      Head Circumference --      Peak Flow --      Pain Score --      Pain Loc --      Pain Education --      Exclude from Growth Chart --     Most recent vital signs: Vitals:   05/05/24 0340 05/05/24 0400  BP: (!) 82/58 (!) 84/57  Pulse: 79 82  Resp: 17 14  Temp:    SpO2: 90% 100%    General: Awake, no distress.  CV:  Good peripheral perfusion.  Resp:  Normal effort.   Abd:  No distention.  Other:  Nods off to sleep intermittently but then wakeful for me to answer my questions.  He does note that he is in the emergency department and oriented to self and situation.  Does not know the month and appears frustrated since I should know this breathing comfortably on room air without focality or wheezing, monitor reads hypoxemia, though poor reading in the setting of patient movement, soft benign abdominal exam nonperitoneal nonfocal.  Moving all extremities with full active range of motion sensate throughout, no dysarthria or facial asymmetry.  Did not test gait.  Hypotensive initially 70/30 but then on recheck 100/75.  No obvious jaundice scleral icterus or asterixis.  No obvious head trauma.   ED Results / Procedures / Treatments   Labs (all labs ordered are listed, but only abnormal results are displayed) Labs Reviewed  COMPREHENSIVE METABOLIC PANEL WITH GFR - Abnormal; Notable for the following components:      Result Value   CO2 20 (*)    Glucose,  Bld 125 (*)    BUN 25 (*)    Creatinine, Ser 2.09 (*)    Calcium  8.8 (*)    Albumin 3.2 (*)    AST 209 (*)    ALT 119 (*)    GFR, Estimated 34 (*)    Anion gap 17 (*)    All other components within normal limits  LACTIC ACID, PLASMA - Abnormal; Notable for the following components:   Lactic Acid, Venous 3.5 (*)    All other components within normal limits  LACTIC ACID, PLASMA - Abnormal; Notable for the following components:   Lactic Acid, Venous 2.0 (*)    All other components within normal limits  CBC WITH DIFFERENTIAL/PLATELET - Abnormal; Notable for the following components:   RDW 17.2 (*)    Lymphs Abs 0.6 (*)    All other components within normal limits  BLOOD GAS, VENOUS - Abnormal; Notable for the following components:   pCO2, Ven 41 (*)    All other components within normal limits  TSH - Abnormal; Notable for the following components:   TSH 7.753 (*)    All other components within normal  limits  T4, FREE - Abnormal; Notable for the following components:   Free T4 1.30 (*)    All other components within normal limits  BRAIN NATRIURETIC PEPTIDE - Abnormal; Notable for the following components:   B Natriuretic Peptide 470.9 (*)    All other components within normal limits  CBG MONITORING, ED - Abnormal; Notable for the following components:   Glucose-Capillary 126 (*)    All other components within normal limits  TROPONIN I (HIGH SENSITIVITY) - Abnormal; Notable for the following components:   Troponin I (High Sensitivity) 44 (*)    All other components within normal limits  TROPONIN I (HIGH SENSITIVITY) - Abnormal; Notable for the following components:   Troponin I (High Sensitivity) 37 (*)    All other components within normal limits  RESP PANEL BY RT-PCR (RSV, FLU A&B, COVID)  [id number]  CULTURE, BLOOD (ROUTINE X 2)  CULTURE, BLOOD (ROUTINE X 2)  ETHANOL  AMMONIA  MAGNESIUM   CK  URINALYSIS, W/ REFLEX TO CULTURE (INFECTION SUSPECTED)     I ordered and reviewed the above labs they are notable for blood glucose 126  EKG  ED ECG REPORT I, Ginnie Shams, the attending physician, personally viewed and interpreted this ECG.   Date: 05/05/2024  EKG Time: 0052  Rate: 112  Rhythm: V paced  ST&T Change: no stemi    RADIOLOGY I independently reviewed and interpreted CT head and see no obvious bleed or midline shift I also reviewed radiologist's formal read.   PROCEDURES:  Critical Care performed: Yes, see critical care procedure note(s)  .Critical Care  Performed by: Shams Ginnie, MD Authorized by: Shams Ginnie, MD   Critical care provider statement:    Critical care time (minutes):  40   Critical care was time spent personally by me on the following activities:  Development of treatment plan with patient or surrogate, discussions with consultants, evaluation of patient's response to treatment, examination of patient, ordering and review of laboratory studies,  ordering and review of radiographic studies, ordering and performing treatments and interventions, pulse oximetry, re-evaluation of patient's condition and review of old charts    MEDICATIONS ORDERED IN ED: Medications  midodrine  (PROAMATINE ) tablet 5 mg (5 mg Oral Given 05/05/24 0130)    External physician / consultants:  I spoke with hospital medicine for admission and regarding care plan  for this patient.   IMPRESSION / MDM / ASSESSMENT AND PLAN / ED COURSE  I reviewed the triage vital signs and the nursing notes.                                Patient's presentation is most consistent with acute presentation with potential threat to life or bodily function.  Differential diagnosis includes, but is not limited to, acute metabolic encephalopathy, head injury, ICH, infection, metabolic derangement, respiratory infection or CHF exacerbation, hypercarbia, drug-induced, intoxication   The patient is on the cardiac monitor to evaluate for evidence of arrhythmia and/or significant heart rate changes.  MDM:     Patient here with acute altered mental status again after being hospitalized for a similar occurrence earlier this month with no obvious clear etiology.  He has no focal complaints, no reported trauma, though he is anticoagulated with a fall onto his buttocks on the wound without head imaging will obtain CT head today.  Obtain toxicologic, basic labs, thyroid  studies, ammonia as well.  No focal infectious symptoms, but will check respiratory infectious with viral panel,'s chest x-ray, and also check his BNP as he is fidgeting and has a intermittently low O2 saturation though with no respiratory complaints and a normal respiratory exam, doubt true respiratory problem.  Is hypotensive but apparently has a history of the same and is on midodrine , will give him midodrine  today.  Blood pressure improved without intervention from initial 70 systolic.  He has no focal neurologic deficits to  suggest a stroke.  Pending the workup as above, anticipate admission given his profound altered mental status.     -- Patient stable, blood pressures now at his reported baseline of 80s over 60s to 90s over 60s.  Workup largely unremarkable except for a lactic acidosis in the threes, perhaps to hypoperfusion, but no obvious signs of infection by our examination nor by patient report at this time to warrant antibiotics.  Will recheck lactic.  Respiratory rate was somewhat increased when we were first evaluating him but has settled out in his normal, there is a chest x-ray that reads interstitial edema may represent heart failure, with his history of heart failure and a mildly elevated BNP, I will give him a small dose of IV Lasix  for diuresis.  Viral swabs are negative.  ABG unremarkable.  Admission for encephalopathy, perhaps a small degree of heart failure exacerbation.   -- Blood pressures remaining with MAP above 65, 80s over 60s, IV Lasix  given but did not put out much urine.  LFTs slightly increased, ordered for right upper quadrant ultrasound, benign abdominal exam.  Patient's wife is here, reports that he has been short of breath over the last couple of days further promoting the concern for mild CHF exacerbation.  Patient's respiratory status has been stable and respiratory rate has normalized, appears to be breathing comfortably, no hypoxemia.  I spoke with Dr. Lawence of hospital medicine service and we both agreed that given his borderline blood pressures, poor response to initial IV diuresis, may be appropriate to consult with ICU.  I spoke with ICU consultant APP Almarie who agrees to admit the patient.     FINAL CLINICAL IMPRESSION(S) / ED DIAGNOSES   Final diagnoses:  Altered mental status, unspecified altered mental status type  Lactic acidosis  Acute on chronic congestive heart failure, unspecified heart failure type (HCC)  AKI (acute kidney injury)  Rx / DC Orders    ED Discharge Orders     None        Note:  This document was prepared using Dragon voice recognition software and may include unintentional dictation errors.    Cyrena Mylar, MD 05/05/24 [id number]    Cyrena Mylar, MD 05/05/24 [phone]

## 2024-05-05 NOTE — Telephone Encounter (Signed)
 Duplicate request, too soon for refill.  Requested Prescriptions  Pending Prescriptions Disp Refills   potassium chloride  (MICRO-K ) 10 MEQ CR capsule [Pharmacy Med Name: Potassium Chloride  ER 10 MEQ Oral Capsule Extended Release] 30 capsule 0    Sig: Take 1 capsule by mouth once daily     Endocrinology:  Minerals - Potassium Supplementation Failed - 05/05/2024  3:53 PM      Failed - Cr in normal range and within 360 days    Creatinine  Date Value Ref Range Status  05/25/2014 0.85 0.60 - 1.30 mg/dL Final   Creatinine, Ser  Date Value Ref Range Status  05/05/2024 2.09 (H) 0.61 - 1.24 mg/dL Final         Passed - K in normal range and within 360 days    Potassium  Date Value Ref Range Status  05/05/2024 3.8 3.5 - 5.1 mmol/L Final  05/25/2014 3.6 3.5 - 5.1 mmol/L Final         Passed - Valid encounter within last 12 months    Recent Outpatient Visits           1 week ago Hypotension due to hypovolemia   [place] [facility] [place], [name], DO   1 month ago Cellulitis of right upper extremity   [facility], Leita DEL, MD   1 month ago Supratherapeutic INR   [place] [facility] [place], [place], DO   1 month ago Supratherapeutic INR   [place] [facility] [place], [place], DO   1 month ago Supratherapeutic INR   [facility] [place], Duwaine SQUIBB, DO       Future Appointments             In 1 month End, [name], MD [facility]

## 2024-05-05 NOTE — Assessment & Plan Note (Signed)
 Patient/ wife deny any active tobacco use at present  Monitor

## 2024-05-05 NOTE — Assessment & Plan Note (Signed)
 Atrial Fibrillation V paced rhythm with heart rate in the 1 teens on presentation Continue treatment dose Lovenox  in the setting of labile INRs associated with Coumadin  use Follow-up cardiology recommendations

## 2024-05-05 NOTE — Consult Note (Signed)
 Cardiology Consultation   Patient ID: Roberto [name] MRN: [id number]; DOB: 05-29-1956  Admit date: 05/05/2024 Date of Consult: 05/05/2024  PCP:  [name], DO   [facility] Providers Cardiologist:  [name], MD  Electrophysiologist:  Danelle Birmingham, MD  Sleep Medicine:  Wilbert Bihari, MD       Patient Profile: Roberto [name] is a 68 y.o. male with a hx of mechanical aortic valve replacement, non-obstructive coronary artery disease, thoracic aortic aneurysm (followed by [facility]), stroke, right renal infarct, chronic systolic heart failure (LVEF 20% by cardiac MRI in 06/2023) complicated by VT s/p ICD and subsequent upgrade to CRT-D, persistent atrial fibrillation, hyperlipidemia, obstructive sleep apnea not using CPAP, and morbid obesity who is being seen 05/05/2024 for the evaluation of acute on chronic HFrEF at the request of Dr. Eldonna.  History of Present Illness:   [name] is cardiac history dating back to 03/2010 when he underwent mechanical aortic valve replacement.  Echo in 2022 showed reduction in EF to 40 to 45% dilation of the ascending aorta at 4.6 cm.  In 2022 he underwent LHC which showed 60% stenosis of the large diagonal branch but otherwise only mild disease of the mid LAD and proximal RCA.  Repeat echo in 2022 showed further reduction in EF 35 to 40%.    He was admitted to [facility] 06/2023 with monomorphic VT that resolved with IV amiodarone .  Repeat EKG showed atrial fibrillation with PVCs.  LHC showed stable nonobstructive CAD.  RHC showed cardiac index that was severely reduced with severely elevated filling pressures.  He was briefly placed on inotropes and aggressively diuresed.  Cardiac MRI showed EF 20%, RV 41%, mid myocardial basal septal LGE (nonspecific noncoronary pattern).  He underwent ICD implantation. He was admitted again 08/2023 with atrial fibrillation and brief episodes of VT requiring ATP.  Required cardioversion during that admission.   Echo 09/2023 showed EF reduced further at 25 to 30% with severely dilated LV cavity, G2 DD, mild to moderate MR, mild to moderate TR, mild to moderate AAS with mean gradient 12.6 mmHg.  CT chest 09/2023 showed 5.6 cm ascending aortic aneurysm.    Hospitalized at [facility] 02/2024 with wide-complex tachycardia, initially presenting for weakness and lightheadedness.  He was airlifted from [place] to [facility].  Ultimately, his device was upgraded from single-chamber ICD to biventricular pacemaker.  He was seen in the heart failure clinic 8/20 01/2024 at which time blood pressure was noted to be low at 79/53.  As he appeared euvolemic, his torsemide  was reduced from 20 mg twice daily to 20 mg daily.  Due to persistent hypotension at his visit with Dr. Vicci in early September he was started on midodrine .    He was hospitalized again at [facility] 03/07/2024 due to dehydration in setting of nausea/vomiting and poor oral intake felt to be due to GLP-1 use. He had AKI and torsemide  was held.  He was seen in office by Dr. Hanson 03/19/2024 for follow up and noted to be declining with poor functional capacity.  He reported dyspnea with mild activities and when laying down.  There was concern for low output heart failure and he was added on to be seen in the advanced heart failure clinic that same day.  He saw Dr. Rolan with Reds clip showing 34%.  Toprol -XL was reduced from 200 mg daily to 50 mg daily given low BP and suspected low output.  He was continued on midodrine  and torsemide  was held.  RHC  03/21/2024 showed normal PCWP, mild mixed pulmonary arterial/pulmonary venous hypertension, predominant RV failure with prominent V waves in the RA tracing suggestive of significant tricuspid regurgitation, mean RA pressure 13, PA PI 1.5, and cardiac output (Fick) 4.73.  Torsemide  was resumed at 20 mg daily.   Patient was most recently seen by [facility] advanced heart failure clinic 04/23/2024 reporting minimal shortness of breath.   He also noted fatigue and right knee pain after recent fall.  Reported weird sensation over his device with inability to further elaborate other than feeling like when you look at battery.  Denied shocking of his device.  No medication changes were made.  He was admitted to [facility] 04/30/2024 for altered mental status.  Potassium noted to be low at 2.9.  Hemoglobin also mildly reduced at 10.2 with hematocrit 32.2.  Reportedly with frequent PVCs and an episode of NSVT with EMS.  Midodrine  was increased to 5 mg 3 times daily due to low blood pressure.  PTA losartan  was discontinued.  He was discharged 05/02/2024.  Presented again to the ED the same day of discharge after a mechanical fall during which he fell on his back.  Lumbar spine x-ray with minimal anterolisthesis at L4-5 due to posterior facet arthropathy.  Degenerative disc disease also noted.  He was given tramadol and lidocaine  patch and discharged without admission.  Patient does not recall the exact events that led up to his arrival in the ED.  His family is not at the bedside to assist so history is obtained mostly from chart review.  He reports several recent falls due to losing his balance.  He denies preceding lightheadedness, dizziness, and palpitations.  He has not lost consciousness when falling.  He arrived to [facility] via EMS after wife reportedly noticed increased shortness of breath and confusion which started on the evening of 11/2.  Per EMS, he had multiple medication changes after recent admission.  He was reportedly combative with EMS.  He denies chest pain, shortness of breath, lightheadedness, dizziness, palpitations, and lower extremity swelling. In the ED, BP 70/29, HR 45 bpm, SpO2 86%, and RR 25. Pertinent labs include BUN 25, Cr 2.09, AST 209, ALT 119, lactic acid 3.5 > 2.0. TSH elevated at 7.753 with free T4 1.3. BNP 470. Troponin 44 > 37. EKG shows is ventricular paced with possible underlying atrial fibrillation/flutter, rate 112  bpm. CT head negative for acute intracranial abnormality. CXR showed cardiomegaly with vascular congestion and suspected early interstitial edema.  Cardiology was asked to consult for acute on chronic HFrEF.  At time of cardiology consult, patient's mental status is improved.  He is able to answer questions appropriately and is alert and oriented x 3.  He denies shortness of breath, although he remains on 2 L supplemental oxygen.   Past Medical History:  Diagnosis Date   Aneurysm    Bicuspid aortic valve    a. s/p #27 Carbomedics mechanical valve on 03/25/2010; b. on Coumadin ; c. TTE 12/17: EF 40-45%, moderately dilated LV with moderate LVH, AVR well-seated with 14 mmHg gradient, peak AV velocity 2.5 m/s, mild mitral valve thickening with mild MR, mildly dilated RV with mildly reduced contraction   Cellulitis    CHF (congestive heart failure) (HCC)    Chronic kidney disease    Chronic systolic CHF (congestive heart failure) (HCC)    a. R/LHC 03/2010 showed no significant CAD, LVEDP 31 mmHg, mean AoV gradient 34 mmHg at rest and 47 mmHg with dobutamine 20 mcg/kg/min, AVA 1.0 cm^2,  RA 31, RV 68/25, PA 68/47, PCWP 38. PA sat 65%. CO 6.2 L/min (Fick) and 5.3 L/min (thermodilution)   Clotting disorder    COPD (chronic obstructive pulmonary disease) (HCC)    H/O mechanical aortic valve replacement 03/25/2010   a. 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   Stage 3 chronic kidney disease (HCC)    Stroke [facility])    TIA (transient ischemic attack) 05/2014    Past Surgical History:  Procedure Laterality Date   AORTIC VALVE REPLACEMENT     AORTIC VALVE SURGERY     CARDIAC CATHETERIZATION  03/21/2010   No significant CAD. Severe aortic stenosis. Severely elevated left and right heart filling pressures.   CARDIAC SURGERY  2009   CHF   CARDIOVERSION N/A  08/28/2023   Procedure: CARDIOVERSION;  Surgeon: [name] Slain, MD;  Location: [facility] INVASIVE CV LAB;  Service: Cardiovascular;  Laterality: N/A;   CARPAL TUNNEL RELEASE Left 2005   COLONOSCOPY N/A 12/13/2023   Procedure: COLONOSCOPY;  Surgeon: Suzann Inocente HERO, MD;  Location: [facility] ENDOSCOPY;  Service: Gastroenterology;  Laterality: N/A;   ICD IMPLANT N/A 06/25/2023   Procedure: ICD IMPLANT;  Surgeon: Waddell Danelle ORN, MD;  Location: [facility] INVASIVE CV LAB;  Service: Cardiovascular;  Laterality: N/A;   PACEMAKER IMPLANT     POLYPECTOMY  12/13/2023   Procedure: POLYPECTOMY, INTESTINE;  Surgeon: Suzann Inocente HERO, MD;  Location: [facility] ENDOSCOPY;  Service: Gastroenterology;;   RIGHT HEART CATH Right 03/21/2024   Procedure: RIGHT HEART CATH;  Surgeon: Rolan Ezra RAMAN, MD;  Location: [facility] INVASIVE CV LAB;  Service: Cardiovascular;  Laterality: Right;   RIGHT HEART CATH AND CORONARY ANGIOGRAPHY N/A 01/28/2019   Procedure: RIGHT HEART CATH AND CORONARY ANGIOGRAPHY;  Surgeon: [name], MD;  Location: [facility] INVASIVE CV LAB;  Service: Cardiovascular;  Laterality: N/A;   RIGHT HEART CATH AND CORONARY ANGIOGRAPHY N/A 06/20/2023   Procedure: RIGHT HEART CATH AND CORONARY ANGIOGRAPHY;  Surgeon: [name], MD;  Location: [facility] INVASIVE CV LAB;  Service: Cardiovascular;  Laterality: N/A;   TONSILLECTOMY  1962       Scheduled Meds:  allopurinol   100 mg Oral Daily   budesonide-glycopyrrolate-formoterol   2 puff Inhalation BID   buPROPion   300 mg Oral Daily   enoxaparin  (LOVENOX ) injection  1 mg/kg Subcutaneous Q12H   fenofibrate   54 mg Oral Daily   insulin  aspart  0-9 Units Subcutaneous TID WC   lidocaine   1 patch Transdermal Q24H   midodrine   10 mg Oral TID WC   pantoprazole   40 mg Oral BID   Continuous Infusions:  PRN Meds: prochlorperazine **OR** prochlorperazine  Allergies:    Allergies  Allergen Reactions   Latex Hives   Nicotine  Hives and Rash    Patches caused localized rash and  hives     Social History:   Social History   Socioeconomic History   Marital status: Divorced    Spouse name: Not on file   Number of children: 1   Years of education: 14   Highest education level: Associate degree: occupational, scientist, product/process development, or vocational program  Occupational History   Occupation: Disabled    Employer: UNEMPLOYED  Tobacco Use   Smoking status: Every Day    Current packs/day: 0.20    Average packs/day: 0.5 packs/day for 48.3 years (23.8 ttl pk-yrs)    Types: Cigarettes  Start date: 47    Last attempt to quit: 2021   Smokeless tobacco: Never   Tobacco comments:    Caregiver reports patient currently smokes approximately 2 cigarettes/day.  Vaping Use   Vaping status: Never Used  Substance and Sexual Activity   Alcohol use: Never   Drug use: Never   Sexual activity: Not Currently  Other Topics Concern   Not on file  Social History Narrative   Not on file   Social Drivers of Health   Financial Resource Strain: High Risk (04/21/2024)   Overall Financial Resource Strain (CARDIA)    Difficulty of Paying Living Expenses: Hard  Food Insecurity: Food Insecurity Present (05/01/2024)   Hunger Vital Sign    Worried About Running Out of Food in the Last Year: Sometimes true    Ran Out of Food in the Last Year: Sometimes true  Transportation Needs: Unmet Transportation Needs (05/01/2024)   PRAPARE - Administrator, Civil Service (Medical): Yes    Lack of Transportation (Non-Medical): No  Physical Activity: Inactive (04/21/2024)   Exercise Vital Sign    Days of Exercise per Week: 0 days    Minutes of Exercise per Session: Not on file  Stress: Stress Concern Present (04/21/2024)   [employer] of Occupational Health - Occupational Stress Questionnaire    Feeling of Stress: Very much  Social Connections: Socially Isolated (05/01/2024)   Social Connection and Isolation Panel    Frequency of Communication with Friends and Family: Never     Frequency of Social Gatherings with Friends and Family: Never    Attends Religious Services: Never    Database Administrator or Organizations: No    Attends Banker Meetings: Never    Marital Status: Living with partner  Intimate Partner Violence: Not At Risk (05/01/2024)   Humiliation, Afraid, Rape, and Kick questionnaire    Fear of Current or Ex-Partner: No    Emotionally Abused: No    Physically Abused: No    Sexually Abused: No    Family History:    Family History  Problem Relation Age of Onset   Arthritis Mother    Dementia Mother    Colon cancer Mother    Arthritis Father    Diabetes Father    Stroke Father    Colon cancer Father    Heart attack Brother    Breast cancer Sister    Seizures Sister    Cancer Brother        brain   Heart disease Brother    Heart attack Brother      ROS:  Please see the history of present illness.    Physical Exam/Data: Vitals:   05/05/24 0700 05/05/24 0900 05/05/24 0930 05/05/24 0959  BP: 104/76 (!) 85/66 (!) 88/71   Pulse: 80 79 87   Resp: 15 (!) 29 18   Temp:    98.2 F (36.8 C)  TempSrc:    Oral  SpO2: 93% 100% 99%     Intake/Output Summary (Last 24 hours) at 05/05/2024 1034 Last data filed at 05/05/2024 0908 Gross per 24 hour  Intake --  Output 720 ml  Net -720 ml      05/02/2024    7:17 PM 04/30/2024    7:21 PM 04/25/2024   10:01 AM  Last 3 Weights  Weight (lbs) 196 lb 12.8 oz 243 lb 9.7 oz 224 lb 8 oz  Weight (kg) 89.268 kg 110.5 kg 101.833 kg     There is  no height or weight on file to calculate BMI.  General:  Well nourished, well developed, in no acute distress HEENT: normal Neck: no JVD Vascular: No carotid bruits; Distal pulses 2+ bilaterally Cardiac:  irregular rhythm, regular rate; normal S1, S2; no murmur  Lungs:  clear to auscultation bilaterally, no wheezing, rhonchi or rales  Abd: soft, nontender, no hepatomegaly  Ext: no edema Skin: warm and dry  Psych:  Normal affect   EKG:   The EKG was personally reviewed and demonstrates:  ventricular paced rhythm with possible underlying atrial fibrillation, rate 112 bpm Telemetry:  Telemetry was personally reviewed and demonstrates:  ventricular paced rhythm, rate 70s-80s bpm  Relevant CV Studies:  03/2024 RHC 1. Normal PCWP 2. Mild mixed pulmonary arterial/pulmonary venous hypertension 3. Predominant RV failure with prominent V waves in the RA tracing suggestive of significant tricuspid regurgitation, mean RA pressure 13.  PAPi is low.  4. CO is low but not markedly low.   09/2023 Echo complete 1. Left ventricular ejection fraction, by estimation, is 25 to 30%. Left  ventricular ejection fraction by 3D volume is 28 %. The left ventricle has  severely decreased function. The left ventricle demonstrates global  hypokinesis. The left ventricular  internal cavity size was severely dilated. Left ventricular diastolic  parameters are consistent with Grade II diastolic dysfunction  (pseudonormalization). The average left ventricular global longitudinal  strain is -10.5 %. The global longitudinal strain  is abnormal.   2. Right ventricular systolic function is mildly reduced. The right  ventricular size is normal.   3. Left atrial size was mild to moderately dilated.   4. Right atrial size was moderately dilated.   5. The mitral valve is degenerative. Mild to moderate mitral valve  regurgitation. No evidence of mitral stenosis.   6. Tricuspid valve regurgitation is mild to moderate.   7. The aortic valve has been repaired/replaced. Aortic valve  regurgitation versus perivalvular leak is trivial. Mild to moderate aortic  valve stenosis with at least some component of patient-prosthesis  mismatch. Aortic valve mean gradient measures 12.6  mmHg.   8. There is mild dilatation of the aortic root, measuring 43 mm. Known  severe dilation of the ascending aorta is not well-visualized on this  examination.   Laboratory Data: High  Sensitivity Troponin:   Recent Labs  Lab 04/30/24 1905 04/30/24 2145 05/05/24 0114 05/05/24 0240  TROPONINIHS 26* 32* 44* 37*     Chemistry Recent Labs  Lab 05/01/24 0424 05/02/24 0348 05/05/24 0114  NA 140 139 136  K 2.8* 4.4 3.8  CL 103 106 99  CO2 26 25 20*  GLUCOSE 89 96 125*  BUN 24* 25* 25*  CREATININE 1.79* 1.73* 2.09*  CALCIUM  9.0 9.0 8.8*  MG 2.0 2.3 1.8  GFRNONAA 41* 42* 34*  ANIONGAP 11 8 17*    Recent Labs  Lab 04/30/24 1905 05/02/24 0348 05/05/24 0114  PROT 6.0*  --  7.2  ALBUMIN 3.1* 3.2* 3.2*  AST 50*  --  209*  ALT 45*  --  119*  ALKPHOS 38  --  63  BILITOT 0.8  --  1.2   Lipids No results for input(s): CHOL, TRIG, HDL, LABVLDL, LDLCALC, CHOLHDL in the last 168 hours.  Hematology Recent Labs  Lab 04/30/24 1905 05/01/24 0424 05/05/24 0114  WBC 10.9* 9.7 7.2  RBC 3.55*  3.55* 3.91* 4.52  HGB 10.2* 11.2* 13.1  HCT 32.2* 35.0* 40.7  MCV 90.7 89.5 90.0  MCH 28.7 28.6 29.0  MCHC 31.7 32.0 32.2  RDW 16.1* 16.2* 17.2*  PLT 182 209 258   Thyroid   Recent Labs  Lab 05/05/24 0114  TSH 7.753*  FREET4 1.30*    BNP Recent Labs  Lab 05/05/24 0114  BNP 470.9*    DDimer No results for input(s): DDIMER in the last 168 hours.  Radiology/Studies:  US  ABDOMEN LIMITED RUQ (LIVER/GB) Result Date: 05/05/2024 IMPRESSION: 1. Heterogeneous echogenicity and nodular ventral hepatic contour, characteristic of cirrhosis. Electronically signed by: Evalene Coho MD 05/05/2024 04:39 AM EST RP Workstation: [id number]   CT Head Wo Contrast Result Date: 05/05/2024 IMPRESSION: 1. No acute intracranial abnormality. 2. Diffuse cerebral atrophy and chronic small vessel disease throughout the deep white matter. Electronically signed by: Franky Crease MD 05/05/2024 01:32 AM EST RP Workstation: [id number]   DG Chest Port 1 View Result Date: 05/05/2024 IMPRESSION: 1. Cardiomegaly with vascular congestion and suspected early interstitial edema.  Electronically signed by: Franky Crease MD 05/05/2024 01:32 AM EST RP Workstation: [id number]   DG Lumbar Spine Complete Result Date: 05/02/2024 IMPRESSION: 1. Minimal anterolisthesis at L4-5 due to posterior facet arthropathy. 2. Mild multilevel degenerative disc disease at L1-2, L2-3, and L3-4. Electronically signed by: Lynwood Seip MD 05/02/2024 06:53 PM EDT RP Workstation: [id number]   Assessment and Plan:  Acute on chronic HFrEF Nonischemic cardiomyopathy Hypotension - Recently worsening cardiomyopathy, followed by advanced heart failure clinic.  Most recent echo 09/2023 showing EF 25 to 30% with severe LV dilation.  RHC 03/2024 showed predominant RV failure with prominent V waves in the RA tracings suggestive of significant tricuspid regurgitation, mean RA pressure 13 and CO (Fick) 4.73. - Presenting after wife noted altered mental status and shortness of breath - BNP 470, chest x-ray with suspected early interstitial edema - S/p IV Lasix  40 mg x 1 in the ED - Does not appear grossly volume overloaded on exam - Longstanding history of low blood pressure, appears to typically run in the 80s-90s systolic.  Was on midodrine  2.5 mg twice daily PTA.  This has been uptitrated to 10 mg three times daily with improvement in BP. - Lactic acid 3.5 > 2.0.  Patient is warm to touch distally.  Do not suspect cardiogenic shock. - PTA GDMT including Toprol -XL and torsemide  on hold given low BP - Will defer additional diuresis to MD given hypotension - Check limited echo  Encephalopathy - Recent admission with no clear cause identified. Presenting again with similar symptoms.  - Patient is alert and oriented with improvement in mental status on my exam  Ventricular tachycardia Prolonged QTc - Two admissions earlier this year with sustained VT with ICD upgraded to CRT-D 02/2024 at [facility] - He has historically been on mexiletine 150 mg three times daily and amiodarone  200 mg daily - Qtc noted to be long at  649 ms on admission with mexiletine and amiodarone  held - Prior EKGs consistent with prolonged Qtc, up to 649 ms 03/2024  - Would recommend resuming mexiletine and updating 12 lead EKG - Can likely resume amiodarone  as well, will defer to MD  Atrial fibrillation/flutter - Follows with EP as outpatient. S/p DCCV 08/2023 - EKG rhythm difficult to discern but may be in atrial fibrillation/flutter with BiV pacing - May need device interrogation to determine rhythm - Metoprolol  held as above - Anticoagulation per pharmacy recommendations  Mechanical aortic valve - Stable function on echo 09/2023 - Repeat limited echo as above - Anticoagulation per pharmacy recommendations   For questions or updates, please contact [facility] Please consult [email or web address] for contact info under      Signed, Lesley LITTIE Maffucci, [name]  05/05/2024 10:34 AM

## 2024-05-05 NOTE — ED Triage Notes (Addendum)
 Pt bib [facility] after wife noticed pt to be shob and have increased confusion that started right before 2000 last night. Per EMS he has had multiple medications changed recently and was just admitted to the hospital for same type of ams. Pt arrives alert to self and location. Pt was combative with EMS

## 2024-05-05 NOTE — H&P (Addendum)
 History and Physical    PatientBETHA Deo [name] FMW:[id number] DOB: [birth year] DOA: 05/05/2024 DOS: the patient was seen and examined on 05/05/2024 PCP: [name], DO  Patient coming from: Home  Chief Complaint:  Chief Complaint  Patient presents with   Altered Mental Status   HPI: Roberto [name] is a 68 y.o. male with medical history significant of essential hypertension, dyslipidemia, bicuspid aortic valve status post mechanical valve replacement on Lovenox ,, CVA/TIA, CKD stage IIIb, chronic HFrEF, s/p ICD presenting with encephalopathy, acute on chronic HFrEF, acute respiratory failure with hypoxia.  History primarily from patient's wife in the setting of encephalopathy.  Per report, patient noted to have been recently admitted October 29 through October 31 for issues including acute metabolic encephalopathy, acute on chronic hypotension.  Per the wife, patient's had multiple falls from discharge as well as within the past 24 hours.  No reports of focal hemiparesis.  Has had generalized weakness as well as confusion and has seemed to be somewhat chronic.  Noted prior history of TIA.  There was some concern for dementia in the outpatient setting.  However, wife reports that that was not fully worked up because of other pressing issues by PCP.  No chest pain or shortness of breath.  No orthopnea.  No reported alcohol or tobacco use.  No reported illicit drug use.  Has been compliant with home midodrine  as well as torsemide .  No reported high salt or NSAID intake.  No reports of wheezing or cough. No reported head trauma or LOC associated with fall.  Presented to the ER afebrile, hemodynamically stable. WBC 7.2, hgb 13.1, plt 258, UA WNL. Lactate 3.5-->2, trop 44-->37. Cr 2.1, AST 209, ALT 119. T bili 1.2. BNP 471. Covid, flu, RSV negative. Ammonia and CK level WNL. CXR w/ vascular congestion and cardiomegaly. CT head and RUQ u/s WNL.  Review of Systems: As mentioned in the history of present  illness. All other systems reviewed and are negative. Past Medical History:  Diagnosis Date   Aneurysm    Bicuspid aortic valve    a. s/p #27 Carbomedics mechanical valve on 03/25/2010; b. on Coumadin ; c. TTE 12/17: EF 40-45%, moderately dilated LV with moderate LVH, AVR well-seated with 14 mmHg gradient, peak AV velocity 2.5 m/s, mild mitral valve thickening with mild MR, mildly dilated RV with mildly reduced contraction   Cellulitis    CHF (congestive heart failure) (HCC)    Chronic kidney disease    Chronic systolic CHF (congestive heart failure) (HCC)    a. R/LHC 03/2010 showed no significant CAD, LVEDP 31 mmHg, mean AoV gradient 34 mmHg at rest and 47 mmHg with dobutamine 20 mcg/kg/min, AVA 1.0 cm^2, RA 31, RV 68/25, PA 68/47, PCWP 38. PA sat 65%. CO 6.2 L/min (Fick) and 5.3 L/min (thermodilution)   Clotting disorder    COPD (chronic obstructive pulmonary disease) (HCC)    H/O mechanical aortic valve replacement 03/25/2010   a. 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   Stage 3 chronic kidney disease (HCC)    Stroke [facility])    TIA (transient ischemic attack) 05/2014   Past Surgical History:  Procedure Laterality Date   AORTIC VALVE REPLACEMENT     AORTIC VALVE SURGERY     CARDIAC CATHETERIZATION  03/21/2010   No significant CAD. Severe aortic stenosis. Severely elevated  left and right heart filling pressures.   CARDIAC SURGERY  2009   CHF   CARDIOVERSION N/A 08/28/2023   Procedure: CARDIOVERSION;  Surgeon: [name] Slain, MD;  Location: [facility] INVASIVE CV LAB;  Service: Cardiovascular;  Laterality: N/A;   CARPAL TUNNEL RELEASE Left 2005   COLONOSCOPY N/A 12/13/2023   Procedure: COLONOSCOPY;  Surgeon: Suzann Inocente HERO, MD;  Location: [facility] ENDOSCOPY;  Service: Gastroenterology;  Laterality: N/A;   ICD IMPLANT N/A 06/25/2023   Procedure:  ICD IMPLANT;  Surgeon: Waddell Danelle ORN, MD;  Location: [facility] INVASIVE CV LAB;  Service: Cardiovascular;  Laterality: N/A;   PACEMAKER IMPLANT     POLYPECTOMY  12/13/2023   Procedure: POLYPECTOMY, INTESTINE;  Surgeon: Suzann Inocente HERO, MD;  Location: [facility] ENDOSCOPY;  Service: Gastroenterology;;   RIGHT HEART CATH Right 03/21/2024   Procedure: RIGHT HEART CATH;  Surgeon: Rolan Ezra RAMAN, MD;  Location: [facility] INVASIVE CV LAB;  Service: Cardiovascular;  Laterality: Right;   RIGHT HEART CATH AND CORONARY ANGIOGRAPHY N/A 01/28/2019   Procedure: RIGHT HEART CATH AND CORONARY ANGIOGRAPHY;  Surgeon: [name], MD;  Location: [facility] INVASIVE CV LAB;  Service: Cardiovascular;  Laterality: N/A;   RIGHT HEART CATH AND CORONARY ANGIOGRAPHY N/A 06/20/2023   Procedure: RIGHT HEART CATH AND CORONARY ANGIOGRAPHY;  Surgeon: [name], MD;  Location: [facility] INVASIVE CV LAB;  Service: Cardiovascular;  Laterality: N/A;   TONSILLECTOMY  1962   Social History:  reports that he has been smoking cigarettes. He started smoking about 51 years ago. He has a 23.8 pack-year smoking history. He has never used smokeless tobacco. He reports that he does not drink alcohol and does not use drugs.  Allergies  Allergen Reactions   Latex Hives   Nicotine  Hives and Rash    Patches caused localized rash and hives     Family History  Problem Relation Age of Onset   Arthritis Mother    Dementia Mother    Colon cancer Mother    Arthritis Father    Diabetes Father    Stroke Father    Colon cancer Father    Heart attack Brother    Breast cancer Sister    Seizures Sister    Cancer Brother        brain   Heart disease Brother    Heart attack Brother     Prior to Admission medications   Medication Sig Start Date End Date Taking? Authorizing Provider  albuterol  (PROVENTIL ) (2.5 MG/3ML) 0.083% nebulizer solution USE 1 VIAL IN NEBULIZER EVERY 6 HOURS AS NEEDED FOR WHEEZING FOR SHORTNESS OF BREATH 09/25/23  Yes [name], Megan  P, DO  allopurinol  (ZYLOPRIM ) 100 MG tablet Take 1 tablet (100 mg total) by mouth daily. 03/10/24  Yes [name], MD  amiodarone  (PACERONE ) 200 MG tablet Take 2 tablets by mouth twice daily 03/25/24  Yes End, [name], MD  baclofen  (LIORESAL ) 10 MG tablet Take 1 tablet (10 mg total) by mouth 2 (two) times daily. Take before food twice a day, stop if not helping 04/25/24 06/24/24 Yes [name], [name]  buPROPion  (WELLBUTRIN  XL) 300 MG 24 hr tablet Take 300 mg by mouth daily. 08/16/23  Yes [provider]  dapagliflozin  propanediol (FARXIGA ) 10 MG TABS tablet Take 1 tablet (10 mg total) by mouth daily before breakfast. 03/19/24  Yes End, [name], MD  enoxaparin  (LOVENOX ) 120 MG/0.8ML injection INJECT 0.72 ML (108MG ) INTO THE SKIN EVERY 12 HOURS 03/27/24  Yes [name], [name], DO  fenofibrate  (TRICOR ) 48 MG tablet  Take 48 mg by mouth daily. 03/05/24  Yes [provider]  Fluticasone -Umeclidin-Vilant (TRELEGY ELLIPTA ) 200-62.5-25 MCG/ACT AEPB Inhale 1 puff into the lungs daily in the afternoon. 02/22/24  Yes [name], MD  lidocaine  (LIDODERM ) 5 % Place 1 patch onto the skin every 12 (twelve) hours for 5 days. Remove & Discard patch within 12 hours or as directed by MD 05/02/24 05/07/24 Yes Janit Kast, [name]  metoprolol  succinate (TOPROL -XL) 50 MG 24 hr tablet Take 1 tablet (50 mg total) by mouth daily. 03/19/24  Yes Rolan Ezra RAMAN, MD  mexiletine (MEXITIL ) 150 MG capsule Take 150 mg by mouth 3 (three) times daily. 02/07/24  Yes [provider]  midodrine  (PROAMATINE ) 2.5 MG tablet Take 1 tablet (2.5 mg total) by mouth 2 (two) times daily with a meal. 03/04/24  Yes [name], [name], DO  nortriptyline  (PAMELOR ) 10 MG capsule Take 1 capsule by mouth at bedtime 04/08/24  Yes [name], [name], DO  Omega-3 Fatty Acids (OMEGA 3 PO) Take 1,500 mg by mouth 2 (two) times daily.   Yes [provider]  ondansetron  (ZOFRAN -ODT) 4 MG disintegrating tablet Take 1  tablet (4 mg total) by mouth every 8 (eight) hours as needed for nausea or vomiting (before eating). 04/25/24 04/20/25 Yes [name], [name]  pantoprazole  (PROTONIX ) 40 MG tablet Take 1 tablet (40 mg total) by mouth 2 (two) times daily. Then go back to once a day Patient taking differently: Take 40 mg by mouth 2 (two) times daily. 04/25/24  Yes Craig Alan R, [name]  potassium chloride  (MICRO-K ) 10 MEQ CR capsule Take 1 capsule (10 mEq total) by mouth daily. 04/24/24  Yes [name], [name], DO  rosuvastatin  (CRESTOR ) 40 MG tablet Take 1 tablet (40 mg total) by mouth daily. 04/06/23  Yes End, [name], MD  torsemide  (DEMADEX ) 20 MG tablet Take 1 tablet (20 mg total) by mouth daily. 03/21/24  Yes Rolan Ezra RAMAN, MD  traZODone  (DESYREL ) 50 MG tablet Take 0.5 tablets (25 mg total) by mouth at bedtime as needed for sleep. 03/17/24  Yes [name], [name], DO  VENTOLIN  HFA 108 (90 Base) MCG/ACT inhaler INHALE 2 PUFFS BY MOUTH EVERY 6 HOURS AS NEEDED FOR WHEEZING OR SHORTNESS OF BREATH 05/16/23  Yes [name], [name], DO  metFORMIN  (GLUCOPHAGE -XR) 500 MG 24 hr tablet Take 1 tablet by mouth once daily with breakfast Patient not taking: Reported on 05/05/2024 03/19/24   Vicci Bouchard P, DO  Semaglutide , 1 MG/DOSE, (OZEMPIC , 1 MG/DOSE,) 2 MG/1.5ML SOPN Inject 1 mg into the skin once a week. Patient not taking: Reported on 05/05/2024    [provider]  Spacer/Aero-Holding [facility] Use as directed Dx: COPD, J44.9 07/13/23   Vicci Bouchard P, DO  spironolactone  (ALDACTONE ) 25 MG tablet Take 25 mg by mouth daily. Patient not taking: Reported on 05/05/2024 05/04/24   [provider]  traMADol (ULTRAM) 50 MG tablet Take 1 tablet (50 mg total) by mouth every 6 (six) hours as needed for up to 5 days for moderate pain (pain score 4-6) or severe pain (pain score 7-10). 05/02/24 05/07/24  Janit Kast, [name]    Physical Exam: Vitals:   05/05/24 0645 05/05/24 0700 05/05/24 0900 05/05/24 0930   BP: (!) 87/57 104/76 (!) 85/66 (!) 88/71  Pulse: 79 80 79 87  Resp: 14 15 (!) 29 18  Temp:      TempSrc:      SpO2: 91% 93% 100% 99%   Physical Exam Constitutional:  Appearance: He is normal weight.  HENT:     Head: Normocephalic and atraumatic.     Nose: Nose normal.     Mouth/Throat:     Mouth: Mucous membranes are moist.  Eyes:     Pupils: Pupils are equal, round, and reactive to light.  Cardiovascular:     Rate and Rhythm: Normal rate and regular rhythm.  Pulmonary:     Effort: Pulmonary effort is normal.  Abdominal:     General: Bowel sounds are normal.  Musculoskeletal:        General: Normal range of motion.  Skin:    General: Skin is warm.  Neurological:     General: No focal deficit present.     Comments: Mild generalized confusion    Psychiatric:        Mood and Affect: Mood normal.     Data Reviewed:  There are no new results to review at this time.  US  ABDOMEN LIMITED RUQ (LIVER/GB) EXAM: Right Upper Quadrant Abdominal Ultrasound 05/05/2024 03:57:44 AM  TECHNIQUE: Real-time ultrasonography of the right upper quadrant of the abdomen was performed.  COMPARISON: US  Abdomen 03/07/2024.  CLINICAL HISTORY: LFTs abnormal.  FINDINGS:  LIVER: Liver length measures 1.7 x 1.6 x 1.3 cm. The liver is heterogeneously echogenic and demonstrates a nodular ventral contour characteristic of cirrhosis. There is a simple cyst within the right hepatic lobe measuring approximately 16 mm in diameter. There is normal hepatopetal blood flow within the main portal vein. No intrahepatic biliary ductal dilatation.  BILIARY SYSTEM: Gallbladder wall thickness measures 0.4 cm. No pericholecystic fluid. No cholelithiasis. Negative sonographic Murphy's sign. The patient is not focally tender over the gallbladder. Common bile duct measures 0.3 cm.  OTHER: No right upper quadrant ascites.  IMPRESSION: 1. Heterogeneous echogenicity and nodular ventral hepatic  contour, characteristic of cirrhosis.  Electronically signed by: Evalene Coho MD 05/05/2024 04:39 AM EST RP Workstation: [id number] DG Chest Port 1 View EXAM: 1 VIEW XRAY OF THE CHEST 05/05/2024 01:17:36 AM  COMPARISON: 04/30/2024  CLINICAL HISTORY: AMS hypoxemia hx CHF  FINDINGS:  LINES, TUBES AND DEVICES: Left AICD is unchanged.  LUNGS AND PLEURA: Vascular congestion. Suspect early interstitial edema. No focal pulmonary opacity. No pleural effusion. No pneumothorax.  HEART AND MEDIASTINUM: Cardiomegaly. No acute abnormality of the mediastinal silhouette.  BONES AND SOFT TISSUES: No acute osseous abnormality.  IMPRESSION: 1. Cardiomegaly with vascular congestion and suspected early interstitial edema.  Electronically signed by: Franky Crease MD 05/05/2024 01:32 AM EST RP Workstation: [id number] CT Head Wo Contrast EXAM: CT HEAD WITHOUT CONTRAST 05/05/2024 01:27:13 AM  TECHNIQUE: CT of the head was performed without the administration of intravenous contrast. Automated exposure control, iterative reconstruction, and/or weight based adjustment of the mA/kV was utilized to reduce the radiation dose to as low as reasonably achievable.  COMPARISON: 04/30/2024  CLINICAL HISTORY: Mental status change, unknown cause.  FINDINGS:  BRAIN AND VENTRICLES: No acute hemorrhage. No evidence of acute infarct. No hydrocephalus. No extra-axial collection. No mass effect or midline shift. There is diffuse cerebral atrophy and chronic small vessel disease throughout the deep white matter.  ORBITS: No acute abnormality.  SINUSES: No acute abnormality.  SOFT TISSUES AND SKULL: No acute soft tissue abnormality. No skull fracture.  IMPRESSION: 1. No acute intracranial abnormality. 2. Diffuse cerebral atrophy and chronic small vessel disease throughout the deep white matter.  Electronically signed by: Franky Crease MD 05/05/2024 01:32 AM EST RP Workstation:  [id number]  Lab Results  Component Value Date  WBC 7.2 05/05/2024   HGB 13.1 05/05/2024   HCT 40.7 05/05/2024   MCV 90.0 05/05/2024   PLT 258 05/05/2024   Last metabolic panel Lab Results  Component Value Date   GLUCOSE 125 (H) 05/05/2024   NA 136 05/05/2024   K 3.8 05/05/2024   CL 99 05/05/2024   CO2 20 (L) 05/05/2024   BUN 25 (H) 05/05/2024   CREATININE 2.09 (H) 05/05/2024   GFRNONAA 34 (L) 05/05/2024   CALCIUM  8.8 (L) 05/05/2024   PHOS 1.9 (L) 05/02/2024   PROT 7.2 05/05/2024   ALBUMIN 3.2 (L) 05/05/2024   LABGLOB 2.7 04/23/2024   AGRATIO 1.6 10/23/2022   BILITOT 1.2 05/05/2024   ALKPHOS 63 05/05/2024   AST 209 (H) 05/05/2024   ALT 119 (H) 05/05/2024   ANIONGAP 17 (H) 05/05/2024    Assessment and Plan: Encephalopathy Positive generalized confusion on presentation-?  Subacute versus chronic issue Noted similar presentation on most recent admission Unclear etiology at present Hypoxia and hypotension may be confounding issues Some concern for underlying dementia that has not been yet diagnosed CT head stable apart from global cerebral atrophy Ammonia level within normal limits Hold offending medication including trazodone , tramadol and pamelor   Monitor with treatment  Acute respiratory failure with hypoxia (HCC) Acute on chronic HFrEF  2D ECHO 09/2023 with EF 20-25% and grade 2 diastolic dysfunction s/p pacemaker; s/p ARV with mild to moderate aortic stenosis on u/s  Decompensated respiratory status now requiring 2 L nasal cannula with O2 sats around 80% on room air on presentation BNP 471 with noted volume overload and cardiomegaly on chest imaging today  S/p IV lasix   Will consult cardiology in setting of advanced HF and hypotension     Hypotension Noted diagnosis of chronic hypotension on midodrine  Will continue home midodrine  for now Add on cortisol level to rule out adrenal insufficiency Monitor   Prolonged QT interval QTc in 640s on presentation   Correct electroltyes  Hold offendig medications  Follow up cardiology recommendations    COPD (chronic obstructive pulmonary disease) (HCC) Noted decompensated resp status requiring supplemental O2  Does not appear to be in active COPD exacerbation  Prn duonebs and trelegy  Continue with supplemental O2  Reassess as appropriate   H/O mechanical aortic valve replacement Atrial Fibrillation V paced rhythm with heart rate in the 1 teens on presentation Continue treatment dose Lovenox  in the setting of labile INRs associated with Coumadin  use Follow-up cardiology recommendations  Transaminitis AST 209, ALT 119. T bili 1.2.  No active abd pain  RUQ u/s WNL  ETOH and tylenol  level WNL  Will check hepatitis panel  Otherwise monitor for now   Elevated troponin Trop 30s-40s on presentation  No active CP  Looks to be baseline  Monitor for now  Follow cardiology recommendations    Type 2 diabetes mellitus with chronic kidney disease, without long-term current use of insulin  (HCC) Blood sugar in 120s  SSI  A1C  Monitor    Essential hypertension + hypotension on presentation  Hold offending meds  Follow    Chronic kidney disease, stage 3a (HCC) Cr 2.1 today with GFR in the 30s  Looks to be near baseline (Cr 1.8-2.6 w/ GFR 30s-40s)  Monitor w/ diuresis    Nicotine  dependence, cigarettes, uncomplicated Patient/ wife deny any active tobacco use at present  Monitor        Advance Care Planning:   Code Status: Full Code   Consults: Cardiology   Family Communication: Family at  the bedside   Severity of Illness: The appropriate patient status for this patient is OBSERVATION. Observation status is judged to be reasonable and necessary in order to provide the required intensity of service to ensure the patient's safety. The patient's presenting symptoms, physical exam findings, and initial radiographic and laboratory data in the context of their medical condition is  felt to place them at decreased risk for further clinical deterioration. Furthermore, it is anticipated that the patient will be medically stable for discharge from the hospital within 2 midnights of admission.   Author: Elspeth JINNY Masters, MD 05/05/2024 9:52 AM  For on call review [email or web address].

## 2024-05-05 NOTE — Assessment & Plan Note (Signed)
 Trop 30s-40s on presentation  No active CP  Looks to be baseline  Monitor for now  Follow cardiology recommendations

## 2024-05-05 NOTE — Progress Notes (Addendum)
 PHARMACY - ANTICOAGULATION CONSULT NOTE  Pharmacy Consult for enoxaparin  Indication: atrial fibrillation, mechanical aortic valve replacement  Allergies  Allergen Reactions   Latex Hives   Nicotine  Hives and Rash    Patches caused localized rash and hives     Patient Measurements:    Vital Signs: Temp: 98.6 F (37 C) (11/03 0510) Temp Source: Oral (11/03 0510) BP: 88/71 (11/03 0930) Pulse Rate: 87 (11/03 0930)  Labs: Recent Labs    05/05/24 0114 05/05/24 0129 05/05/24 0240  HGB 13.1  --   --   HCT 40.7  --   --   PLT 258  --   --   CREATININE 2.09*  --   --   CKTOTAL  --  241  --   TROPONINIHS 44*  --  37*    Estimated Creatinine Clearance: 34.9 mL/min (A) (by C-G formula based on SCr of 2.09 mg/dL (H)).   Medical History: Past Medical History:  Diagnosis Date   Aneurysm    Bicuspid aortic valve    a. s/p #27 Carbomed[employer] mechanical valve on 03/25/2010; b. on Coumadin ; c. TTE 12/17: EF 40-45%, moderately dilated LV with moderate LVH, AVR well-seated with 14 mmHg gradient, peak AV velocity 2.5 m/s, mild mitral valve thickening with mild MR, mildly dilated RV with mildly reduced contraction   Cellulitis    CHF (congestive heart failure) (HCC)    Chronic kidney disease    Chronic systolic CHF (congestive heart failure) (HCC)    a. R/LHC 03/2010 showed no significant CAD, LVEDP 31 mmHg, mean AoV gradient 34 mmHg at rest and 47 mmHg with dobutamine 20 mcg/kg/min, AVA 1.0 cm^2, RA 31, RV 68/25, PA 68/47, PCWP 38. PA sat 65%. CO 6.2 L/min (Fick) and 5.3 L/min (thermodilution)   Clotting disorder    COPD (chronic obstructive pulmonary disease) (HCC)    H/O mechanical aortic valve replacement 03/25/2010   a. 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   Stage 3 chronic kidney disease (HCC)    Stroke [facility])    TIA (transient  ischemic attack) 05/2014   Assessment: 68 y/o male presenting with altered mental status. PMH significant for l hypertension, dyslipidemia, bicuspid aortic valve status post mechanical valve replacement on Lovenox ,, CVA/TIA, CKD stage IIIb. Patient was recently hospitalized from 04/30/24-05/02/24 for similar issues with mental status. Pharmacy has been consulted to resume home enoxaparin .  Per PCP note on 04/23/24, patient has recently been struggling to eat without vomiting and, therefore, struggling to maintain goal INR levels so the patient was switched from warfarin to enoxaparin  (03/25/24). Plan was to transition back to warfarin once patient could tolerate oral intake.  Per chart review, patient has experienced noticeable weight loss over the last few weeks (108 kg to 89.3 kg), so enoxaparin  will be dosed using the weight documented from last hospitalization on 05/02/24.  Baseline labs: hgb 13.1, plt 258, Scr 2.09, INR ordered  Goal of Therapy:  Monitor platelets by anticoagulation protocol: Yes   Plan:  Start enoxaparin  90 mg SQ every 12 hours Consider LMWH level after 3rd or 4th dose Monitor CBC at least ever 72 hours  Thank you for involving pharmacy in this patient's care.   Damien Napoleon, PharmD Clinical Pharmacist 05/05/2024 9:46 AM

## 2024-05-05 NOTE — Assessment & Plan Note (Addendum)
 Acute on chronic HFrEF  2D ECHO 09/2023 with EF 20-25% and grade 2 diastolic dysfunction s/p pacemaker; s/p ARV with mild to moderate aortic stenosis on u/s  Decompensated respiratory status now requiring 2 L nasal cannula with O2 sats around 80% on room air on presentation BNP 471 with noted volume overload and cardiomegaly on chest imaging today  S/p IV lasix   Will consult cardiology in setting of advanced HF and hypotension

## 2024-05-05 NOTE — ED Notes (Signed)
 BG 64. Pt currently eating [employer]. Will hold on giving juice. Will recheck when patient has finished eating.

## 2024-05-05 NOTE — Assessment & Plan Note (Signed)
+   hypotension on presentation  Hold offending meds  Follow

## 2024-05-05 NOTE — Evaluation (Signed)
 Physical Therapy Evaluation Patient Details Name: Roberto [name] MRN: [id number] DOB: 02/10/1956 Today's Date: 05/05/2024  History of Present Illness  Pt is a 68 y.o. male here with altered mental status, encephalopathy, acute on chronic HFrEF, acute respiratory failure with hypoxia. Seen in ED 10/31 after a fall. Anticipate ICU admit for low BP. PMH of mechanical heart valve on anticoagulation, heart failure, type II diabetic, CKD, liver disease, hypertension, dyslipidemia, CVA/TIA, chronic HFrEF, s/p ICD.   Clinical Impression  Pt admitted with above diagnosis. Pt currently with functional limitations due to the deficits listed below (see PT Problem List). Pt received for PT with OT sitting EOB agreeable to session. At baseline pt is mod-I using RW household distances. Utilization of OT for line/lead mgmt and chair follow as pt reports history of R knee buckling leading to falls at baseline. Pt able to stand CGA and ambulate ~180' with RW at supervision with step through gait and chair follow for safety due to hx of R knee buckling. Pt with good tolerance for distance maintaining SPO2 at 100% on 2 L/min throughout without SOB or increased WOB. Pt does have some safety awareness deficits such as poor hand placement with transfers and poor eccentric control with stand to sit but otherwise close to baseline level of mobility. Pt returns to supine with all needs in reach with VSS. Pt will benefit from skilled Pt services to address safety awareness and Le weakness deficits.      If plan is discharge home, recommend the following: A little help with walking and/or transfers;Assist for transportation;Assistance with cooking/housework;Help with stairs or ramp for entrance;A little help with bathing/dressing/bathroom   Can travel by private vehicle        Equipment Recommendations None recommended by PT  Recommendations for Other Services       Functional Status Assessment Patient has had a recent  decline in their functional status and demonstrates the ability to make significant improvements in function in a reasonable and predictable amount of time.     Precautions / Restrictions Precautions Precautions: Fall Recall of Precautions/Restrictions: Intact Restrictions Weight Bearing Restrictions Per Provider Order: No      Mobility  Bed Mobility Overal bed mobility: Modified Independent               Patient Response: Cooperative  Transfers Overall transfer level: Needs assistance Equipment used: Rolling walker (2 wheels) Transfers: Sit to/from Stand Sit to Stand: Contact guard assist           General transfer comment: VC's for hand placement    Ambulation/Gait Ambulation/Gait assistance: Supervision Gait Distance (Feet): 180 Feet Assistive device: Rolling walker (2 wheels) Gait Pattern/deviations: Step-through pattern          Stairs            Wheelchair Mobility     Tilt Bed Tilt Bed Patient Response: Cooperative  Modified Rankin (Stroke Patients Only)       Balance Overall balance assessment: Needs assistance Sitting-balance support: Bilateral upper extremity supported, Feet supported Sitting balance-Leahy Scale: Good       Standing balance-Leahy Scale: Good Standing balance comment: Per OT able to stand and don pants with RW support                             Pertinent Vitals/Pain Pain Assessment Pain Assessment: 0-10 Pain Score: 7  Pain Location: lower back Pain Descriptors / Indicators: Sore Pain Intervention(s): Repositioned, Monitored during  session    Home Living Family/patient expects to be discharged to:: Private residence Living Arrangements: Spouse/significant other Available Help at Discharge: Family;Available PRN/intermittently Type of Home: House Home Access: Stairs to enter Entrance Stairs-Rails: None Entrance Stairs-Number of Steps: 1   Home Layout: Two level;Able to live on main level  with bedroom/bathroom Home Equipment: Rolling Walker (2 wheels)      Prior Function Prior Level of Function : Independent/Modified Independent;History of Falls (last six months)             Mobility Comments: Since Aug when he had his pacemaker replaced he has utilized a RW; 3 falls in ther last 6 months--fell off steps, and 2 in the yard ADLs Comments: friend/roommate cooks, does pharmacologist, helps with meds; pt mostly IND with ADLs     Extremity/Trunk Assessment   Upper Extremity Assessment Upper Extremity Assessment: Defer to OT evaluation    Lower Extremity Assessment Lower Extremity Assessment: Generalized weakness    Cervical / Trunk Assessment Cervical / Trunk Assessment: Normal  Communication   Communication Communication: No apparent difficulties    Cognition Arousal: Alert Behavior During Therapy: WFL for tasks assessed/performed   PT - Cognitive impairments: No apparent impairments                       PT - Cognition Comments: per EMR suspicion for possible dementia         Cueing Cueing Techniques: Verbal cues     General Comments General comments (skin integrity, edema, etc.): SPO2 100% during and post ambulation on 2 L/min    Exercises     Assessment/Plan    PT Assessment Patient needs continued PT services  PT Problem List Decreased strength;Decreased range of motion;Decreased activity tolerance;Decreased balance;Decreased knowledge of precautions;Decreased mobility       PT Treatment Interventions DME instruction;Gait training;Patient/family education;Functional mobility training;Therapeutic activities;Therapeutic exercise;Balance training;Neuromuscular re-education    PT Goals (Current goals can be found in the Care Plan section)  Acute Rehab PT Goals Patient Stated Goal: return home PT Goal Formulation: With patient Time For Goal Achievement: 05/19/24 Potential to Achieve Goals: Fair    Frequency Min 2X/week      Co-evaluation               AM-PAC PT 6 Clicks Mobility  Outcome Measure Help needed turning from your back to your side while in a flat bed without using bedrails?: A Little Help needed moving from lying on your back to sitting on the side of a flat bed without using bedrails?: A Little Help needed moving to and from a bed to a chair (including a wheelchair)?: A Little Help needed standing up from a chair using your arms (e.g., wheelchair or bedside chair)?: A Little Help needed to walk in hospital room?: A Little Help needed climbing 3-5 steps with a railing? : A Lot 6 Click Score: 17    End of Session Equipment Utilized During Treatment: Gait belt;Oxygen Activity Tolerance: Patient tolerated treatment well Patient left: in bed;with call bell/phone within reach;with bed alarm set;with family/visitor present Nurse Communication: Mobility status PT Visit Diagnosis: Other abnormalities of gait and mobility (R26.89);Muscle weakness (generalized) (M62.81);Difficulty in walking, not elsewhere classified (R26.2)    Time: 1410-1420 PT Time Calculation (min) (ACUTE ONLY): 10 min   Charges:   PT Evaluation $PT Eval Low Complexity: 1 Low   PT General Charges $$ ACUTE PT VISIT: 1 Visit        Dorina HERO. Fairly  IV, PT, DPT Physical Therapist- [facility]  [facility] 05/05/2024, 3:29 PM

## 2024-05-05 NOTE — Assessment & Plan Note (Signed)
 Cr 2.1 today with GFR in the 30s  Looks to be near baseline (Cr 1.8-2.6 w/ GFR 30s-40s)  Monitor w/ diuresis

## 2024-05-05 NOTE — Assessment & Plan Note (Signed)
 AST 209, ALT 119. T bili 1.2.  No active abd pain  RUQ u/s WNL  ETOH and tylenol  level WNL  Will check hepatitis panel  Otherwise monitor for now

## 2024-05-05 NOTE — Assessment & Plan Note (Signed)
 Blood sugar in 120s  SSI  A1C  Monitor

## 2024-05-05 NOTE — Assessment & Plan Note (Signed)
 Noted diagnosis of chronic hypotension on midodrine  Will continue home midodrine  for now Add on cortisol level to rule out adrenal insufficiency Monitor

## 2024-05-06 DIAGNOSIS — I5023 Acute on chronic systolic (congestive) heart failure: Secondary | ICD-10-CM | POA: Diagnosis not present

## 2024-05-06 DIAGNOSIS — J810 Acute pulmonary edema: Secondary | ICD-10-CM | POA: Diagnosis not present

## 2024-05-06 DIAGNOSIS — I472 Ventricular tachycardia, unspecified: Secondary | ICD-10-CM

## 2024-05-06 LAB — COMPREHENSIVE METABOLIC PANEL WITH GFR
ALT: 301 U/L — ABNORMAL HIGH (ref 0–44)
AST: 531 U/L — ABNORMAL HIGH (ref 15–41)
Albumin: 2.9 g/dL — ABNORMAL LOW (ref 3.5–5.0)
Alkaline Phosphatase: 60 U/L (ref 38–126)
Anion gap: 9 (ref 5–15)
BUN: 22 mg/dL (ref 8–23)
CO2: 28 mmol/L (ref 22–32)
Calcium: 9 mg/dL (ref 8.9–10.3)
Chloride: 105 mmol/L (ref 98–111)
Creatinine, Ser: 1.67 mg/dL — ABNORMAL HIGH (ref 0.61–1.24)
GFR, Estimated: 44 mL/min — ABNORMAL LOW (ref >60)
Glucose, Bld: 77 mg/dL (ref 70–99)
Potassium: 4 mmol/L (ref 3.5–5.1)
Sodium: 142 mmol/L (ref 135–145)
Total Bilirubin: 1 mg/dL (ref 0.0–1.2)
Total Protein: 6.2 g/dL — ABNORMAL LOW (ref 6.5–8.1)

## 2024-05-06 LAB — URINE DRUG SCREEN, QUALITATIVE (ARMC ONLY)
Amphetamines, Ur Screen: NOT DETECTED
Barbiturates, Ur Screen: NOT DETECTED
Benzodiazepine, Ur Scrn: NOT DETECTED
Cannabinoid 50 Ng, Ur ~~LOC~~: NOT DETECTED
Cocaine Metabolite,Ur ~~LOC~~: NOT DETECTED
MDMA (Ecstasy)Ur Screen: NOT DETECTED
Methadone Scn, Ur: NOT DETECTED
Opiate, Ur Screen: NOT DETECTED
Phencyclidine (PCP) Ur S: NOT DETECTED
Tricyclic, Ur Screen: POSITIVE — AB

## 2024-05-06 LAB — CBC
HCT: 36.1 % — ABNORMAL LOW (ref 39.0–52.0)
Hemoglobin: 11.3 g/dL — ABNORMAL LOW (ref 13.0–17.0)
MCH: 28.5 pg (ref 26.0–34.0)
MCHC: 31.3 g/dL (ref 30.0–36.0)
MCV: 90.9 fL (ref 80.0–100.0)
Platelets: 217 K/uL (ref 150–400)
RBC: 3.97 MIL/uL — ABNORMAL LOW (ref 4.22–5.81)
RDW: 17.3 % — ABNORMAL HIGH (ref 11.5–15.5)
WBC: 5.4 K/uL (ref 4.0–10.5)
nRBC: 0 % (ref 0.0–0.2)

## 2024-05-06 LAB — PROTIME-INR
INR: 1.4 — ABNORMAL HIGH (ref 0.8–1.2)
Prothrombin Time: 17.5 s — ABNORMAL HIGH (ref 11.4–15.2)

## 2024-05-06 LAB — GLUCOSE, CAPILLARY
Glucose-Capillary: 85 mg/dL (ref 70–99)
Glucose-Capillary: 132 mg/dL — ABNORMAL HIGH (ref 70–99)
Glucose-Capillary: 106 mg/dL — ABNORMAL HIGH (ref 70–99)

## 2024-05-06 MED ORDER — TRAMADOL HCL 50 MG PO TABS
50.0000 mg | ORAL_TABLET | Freq: Four times a day (QID) | ORAL | Status: DC | PRN
  Administered 2024-05-06 – 2024-05-08 (×7): 50 mg via ORAL
  Filled 2024-05-06 (×8): qty 1

## 2024-05-06 NOTE — Progress Notes (Signed)
 Mobility Specialist Progress Note:    05/06/24 1641  Mobility  Activity Ambulated with assistance  Level of Assistance Contact guard assist, steadying assist  Assistive Device Front wheel walker  Distance Ambulated (ft) 180 ft  Range of Motion/Exercises Active;All extremities  Activity Response Tolerated well  Mobility visit 1 Mobility  Mobility Specialist Start Time (ACUTE ONLY) 1622  Mobility Specialist Stop Time (ACUTE ONLY) 1640  Mobility Specialist Time Calculation (min) (ACUTE ONLY) 18 min   Pt received sitting EOB, wife at bedside. Agreeable to mobility, required CGA to stand and SBA to ambulate with RW. Tolerated well, required one seated rest break. SpO2 97% on 2L and HR range 80-95 bpm. Pt then able to ambulate back to room, vital signs remain the same after session. Left pt sitting EOB, nurse notified. All needs met.  Sherrilee Ditty Mobility Specialist Please contact via Special Educational Needs Teacher or  Rehab office at [phone]

## 2024-05-06 NOTE — Progress Notes (Signed)
 PROGRESS NOTE    Roberto [name]  FMW:[id number] DOB: 09-11-1955 DOA: 05/05/2024 PCP: [name], DO    Brief Narrative:  68 y.o. male with medical history significant of essential hypertension, dyslipidemia, bicuspid aortic valve status post mechanical valve replacement on Lovenox ,, CVA/TIA, CKD stage IIIb, chronic HFrEF, s/p ICD presenting with encephalopathy, acute on chronic HFrEF, acute respiratory failure with hypoxia.  History primarily from patient's wife in the setting of encephalopathy.  Per report, patient noted to have been recently admitted October 29 through October 31 for issues including acute metabolic encephalopathy, acute on chronic hypotension.  Per the wife, patient's had multiple falls from discharge as well as within the past 24 hours.  No reports of focal hemiparesis.  Has had generalized weakness as well as confusion and has seemed to be somewhat chronic.  Noted prior history of TIA.  There was some concern for dementia in the outpatient setting.  However, wife reports that that was not fully worked up because of other pressing issues by PCP.  No chest pain or shortness of breath.  No orthopnea.  No reported alcohol or tobacco use.  No reported illicit drug use.  Has been compliant with home midodrine  as well as torsemide .  No reported high salt or NSAID intake.  No reports of wheezing or cough. No reported head trauma or LOC associated with fall.    Assessment & Plan:   Principal Problem:   Acute pulmonary edema (HCC) Active Problems:   Encephalopathy   Hypotension   Acute respiratory failure with hypoxia (HCC)   H/O mechanical aortic valve replacement   COPD (chronic obstructive pulmonary disease) (HCC)   Prolonged QT interval   Type 2 diabetes mellitus with chronic kidney disease, without long-term current use of insulin  (HCC)   Elevated troponin   Transaminitis   Nicotine  dependence, cigarettes, uncomplicated   Chronic kidney disease, stage 3a (HCC)    Essential hypertension  Encephalopathy Patient was reportedly having generalized confusion on presentation.  Questionable how much of this is acute versus chronic.  Documented similar presentation on most recent admission.  Unclear etiology.  Hypotension and hypoxia may be confounding issues.  No formal diagnosis of dementia.  CT head with some global atrophy.  Ammonia within normal limits. Plan: Patient is alert and oriented x 3 on my evaluation.  Can resume home trazodone  and tramadol.  Hold Pamelor  for now.   Acute respiratory failure with hypoxia (HCC) Acute on chronic HFrEF  2D ECHO 09/2023 with EF 20-25% and grade 2 diastolic dysfunction s/p pacemaker; s/p ARV with mild to moderate aortic stenosis on u/s  Decompensated respiratory status now requiring 2 L nasal cannula with O2 sats around 80% on room air on presentation BNP 471 with noted volume overload and cardiomegaly on chest imaging today  Plan: Patient with severely reduced ejection fraction.  Case discussed with cardiology.  Patient's prognosis is poor.  Patient's cardiologist is Dr. Mady.  Dr. Mady spoken to the patient and his wife extensively.  Patient wants to go home however he is very likely to bounce back within a few days.  Palliative care consultation requested.  Recommend consideration for DNR/DNI status and potentially further de-escalation of care.  Cardiology to follow-up once palliative care consultation is completed.  Hypotension Noted diagnosis of chronic hypotension on midodrine  Will continue home midodrine  for now   Prolonged QT interval QTc in 640s on presentation  Correct electroltyes  Hold offendig medications  Follow-up cardiac recommendations   COPD (chronic obstructive pulmonary  disease) (HCC) Noted decompensated resp status requiring supplemental O2  Does not appear to be in active COPD exacerbation  Prn duonebs and trelegy  Continue with supplemental O2  Reassess as appropriate    H/O mechanical  aortic valve replacement Atrial Fibrillation Ventricular paced rhythm Patient with BiV ICD in place On weight-based Lovenox   Transaminitis AST 209, ALT 119. T bili 1.2.  No active abd pain  RUQ u/s WNL  ETOH and tylenol  level WNL  Monitor   Elevated troponin Trop 30s-40s on presentation  No active CP  Looks to be baseline    Type 2 diabetes mellitus with chronic kidney disease, without long-term current use of insulin  (HCC) Hemoglobin A1c 5.9.  Nondiabetic range. DC CBGs   Essential hypertension + hypotension on presentation  Hold offending meds  Follow      Chronic kidney disease, stage 3a (HCC) Cr 2.1 today with GFR in the 30s  Looks to be near baseline (Cr 1.8-2.6 w/ GFR 30s-40s)  Improved after first dose of Lasix  Monitor carefully     Nicotine  dependence, cigarettes, uncomplicated Patient/ wife deny any active tobacco use at present  Monitor    DVT prophylaxis: SQ Lovenox , weight-based therapeutic dose Code Status: Full Family Communication: None today.  Cardiology spoke with patient's spouse Disposition Plan: Status is: Observation The patient will require care spanning > 2 midnights and should be moved to inpatient because: Severe advanced heart failure on IV diuretic   Level of care: Progressive  Consultants:  Cardiology Palliative care  Procedures:  None  Antimicrobials: None   Subjective: Seen and examined.  Sitting up on edge of bed.  No conversational dyspnea noted.  Alert and oriented x 3.  Objective: Vitals:   05/06/24 0437 05/06/24 0500 05/06/24 0733 05/06/24 1236  BP: 116/76  97/69 117/87  Pulse: 80  65 80  Resp: 17  15 16   Temp: 98.6 F (37 C)  98.1 F (36.7 C) 97.9 F (36.6 C)  TempSrc:      SpO2: 99%  (S) (!) 84% 100%  Weight:  90.3 kg      Intake/Output Summary (Last 24 hours) at 05/06/2024 1506 Last data filed at 05/05/2024 2038 Gross per 24 hour  Intake 120 ml  Output 400 ml  Net -280 ml   Filed Weights    05/06/24 0500  Weight: 90.3 kg    Examination:  General exam: Appears fatigued and deconditioned Respiratory system: Clear to auscultation. Respiratory effort normal. Cardiovascular system: Irregular rhythm.  No murmurs, no edema Gastrointestinal system: Soft, nontender, nondistended, normal bowel sounds Central nervous system: Alert and oriented. No focal neurological deficits. Extremities: Symmetric 5 x 5 power. Skin: No rashes, lesions or ulcers Psychiatry: Judgement and insight appear normal. Mood & affect appropriate.     Data Reviewed: I have personally reviewed following labs and imaging studies  CBC: Recent Labs  Lab 04/30/24 1905 05/01/24 0424 05/05/24 0114 05/06/24 0324  WBC 10.9* 9.7 7.2 5.4  NEUTROABS 9.2*  --  6.0  --   HGB 10.2* 11.2* 13.1 11.3*  HCT 32.2* 35.0* 40.7 36.1*  MCV 90.7 89.5 90.0 90.9  PLT 182 209 258 217   Basic Metabolic Panel: Recent Labs  Lab 04/30/24 1905 04/30/24 1958 05/01/24 0424 05/02/24 0348 05/05/24 0114 05/06/24 0324  NA 138  --  140 139 136 142  K 2.9*  --  2.8* 4.4 3.8 4.0  CL 103  --  103 106 99 105  CO2 23  --  26 25  20* 28  GLUCOSE 110*  --  89 96 125* 77  BUN 25*  --  24* 25* 25* 22  CREATININE 1.87*  --  1.79* 1.73* 2.09* 1.67*  CALCIUM  8.3*  --  9.0 9.0 8.8* 9.0  MG  --  1.7 2.0 2.3 1.8  --   PHOS  --   --   --  1.9*  --   --    GFR: Estimated Creatinine Clearance: 43.9 mL/min (A) (by C-G formula based on SCr of 1.67 mg/dL (H)). Liver Function Tests: Recent Labs  Lab 04/30/24 1905 05/02/24 0348 05/05/24 0114 05/06/24 0324  AST 50*  --  209* 531*  ALT 45*  --  119* 301*  ALKPHOS 38  --  63 60  BILITOT 0.8  --  1.2 1.0  PROT 6.0*  --  7.2 6.2*  ALBUMIN 3.1* 3.2* 3.2* 2.9*   No results for input(s): LIPASE, AMYLASE in the last 168 hours. Recent Labs  Lab 04/30/24 1958 05/05/24 0119  AMMONIA <13 24   Coagulation Profile: Recent Labs  Lab 04/30/24 2323 05/05/24 1100 05/06/24 0919  INR 1.2  1.5* 1.4*   Cardiac Enzymes: Recent Labs  Lab 05/05/24 0129  CKTOTAL 241   BNP (last 3 results) No results for input(s): PROBNP in the last 8760 hours. HbA1C: No results for input(s): HGBA1C in the last 72 hours. CBG: Recent Labs  Lab 05/05/24 1203 05/05/24 1251 05/05/24 2347 05/06/24 0735 05/06/24 1238  GLUCAP 64* 78 82 85 132*   Lipid Profile: No results for input(s): CHOL, HDL, LDLCALC, TRIG, CHOLHDL, LDLDIRECT in the last 72 hours. Thyroid  Function Tests: Recent Labs    05/05/24 0114  TSH 7.753*  FREET4 1.30*   Anemia Panel: No results for input(s): VITAMINB12, FOLATE, FERRITIN, TIBC, IRON, RETICCTPCT in the last 72 hours. Sepsis Labs: Recent Labs  Lab 05/05/24 0119 05/05/24 0240  LATICACIDVEN 3.5* 2.0*    Recent Results (from the past 240 hours)  Culture, blood (routine x 2)     Status: None (Preliminary result)   Collection Time: 05/05/24  1:10 AM   Specimen: Right Antecubital; Blood  Result Value Ref Range Status   Specimen Description RIGHT ANTECUBITAL  Final   Special Requests   Final    BOTTLES DRAWN AEROBIC AND ANAEROBIC Blood Culture adequate volume   Culture   Final    NO GROWTH 1 DAY Performed at [facility], [address] [place], [place] [address]    Report Status PENDING  Incomplete  Culture, blood (routine x 2)     Status: None (Preliminary result)   Collection Time: 05/05/24  1:14 AM   Specimen: Left Antecubital; Blood  Result Value Ref Range Status   Specimen Description LEFT ANTECUBITAL  Final   Special Requests   Final    BOTTLES DRAWN AEROBIC AND ANAEROBIC Blood Culture adequate volume   Culture   Final    NO GROWTH 1 DAY Performed at [facility], [address], [place], [place] [address]    Report Status PENDING  Incomplete  Resp panel by RT-PCR (RSV, Flu A&B, Covid) Anterior Nasal Swab     Status: None   Collection Time: 05/05/24  1:14 AM   Specimen: Anterior Nasal Swab   Result Value Ref Range Status   SARS Coronavirus 2 by RT PCR NEGATIVE NEGATIVE Final    Comment: (NOTE) SARS-CoV-2 target nucleic acids are NOT DETECTED.  The SARS-CoV-2 RNA is generally detectable in upper respiratory specimens during the  acute phase of infection. The lowest concentration of SARS-CoV-2 viral copies this assay can detect is 138 copies/mL. A negative result does not preclude SARS-Cov-2 infection and should not be used as the sole basis for treatment or other patient management decisions. A negative result may occur with  improper specimen collection/handling, submission of specimen other than nasopharyngeal swab, presence of viral mutation(s) within the areas targeted by this assay, and inadequate number of viral copies(<138 copies/mL). A negative result must be combined with clinical observations, patient history, and epidemiological information. The expected result is Negative.  Fact Sheet for Patients:  bloggercourse.com  Fact Sheet for Healthcare Providers:  seriousbroker.it  This test is no t yet approved or cleared by the [place]  FDA and  has been authorized for detection and/or diagnosis of SARS-CoV-2 by FDA under an Emergency Use Authorization (EUA). This EUA will remain  in effect (meaning this test can be used) for the duration of the COVID-19 declaration under Section 564(b)(1) of the Act, 21 U.S.C.section 360bbb-3(b)(1), unless the authorization is terminated  or revoked sooner.       Influenza A by PCR NEGATIVE NEGATIVE Final   Influenza B by PCR NEGATIVE NEGATIVE Final    Comment: (NOTE) The Xpert Xpress SARS-CoV-2/FLU/RSV plus assay is intended as an aid in the diagnosis of influenza from Nasopharyngeal swab specimens and should not be used as a sole basis for treatment. Nasal washings and aspirates are unacceptable for Xpert Xpress SARS-CoV-2/FLU/RSV testing.  Fact Sheet for  Patients: bloggercourse.com  Fact Sheet for Healthcare Providers: seriousbroker.it  This test is not yet approved or cleared by the [place]  FDA and has been authorized for detection and/or diagnosis of SARS-CoV-2 by FDA under an Emergency Use Authorization (EUA). This EUA will remain in effect (meaning this test can be used) for the duration of the COVID-19 declaration under Section 564(b)(1) of the Act, 21 U.S.C. section 360bbb-3(b)(1), unless the authorization is terminated or revoked.     Resp Syncytial Virus by PCR NEGATIVE NEGATIVE Final    Comment: (NOTE) Fact Sheet for Patients: bloggercourse.com  Fact Sheet for Healthcare Providers: seriousbroker.it  This test is not yet approved or cleared by the [place]  FDA and has been authorized for detection and/or diagnosis of SARS-CoV-2 by FDA under an Emergency Use Authorization (EUA). This EUA will remain in effect (meaning this test can be used) for the duration of the COVID-19 declaration under Section 564(b)(1) of the Act, 21 U.S.C. section 360bbb-3(b)(1), unless the authorization is terminated or revoked.  Performed at [facility], [address], [place], [place] [address]          Radiology Studies: ECHOCARDIOGRAM LIMITED Result Date: 05/05/2024    ECHOCARDIOGRAM LIMITED REPORT   Patient Name:   Roberto [name] Date of Exam: 05/05/2024 Medical Rec #:  [id number]       Height:       66.0 in Accession #:    [id number]      Weight:       196.8 lb Date of Birth:  [birth year]       BSA:          1.986 m Patient Age:    68 years        BP:           112/78 mmHg Patient Gender: M               HR:           80 bpm. Exam Location:  [facility] Procedure: Limited Echo and Color Doppler (Both Spectral and Color Flow Doppler            were utilized during procedure). Indications:     Congestive Heart Failure I50.9   History:         Patient has prior history of Echocardiogram examinations, most                  recent 09/27/2023. CHF.  Sonographer:     Ashley McNeely-Sloane Referring Phys:  [id number] [facility] L CAREY Diagnosing Phys: Deatrice Cage MD IMPRESSIONS  1. Left ventricular ejection fraction, by estimation, is <20%. The left ventricle has severely decreased function. Left ventricular endocardial border not optimally defined to evaluate regional wall motion. There is moderate left ventricular hypertrophy.  2. Right ventricular systolic function is moderately reduced. The right ventricular size is mildly enlarged. Tricuspid regurgitation signal is inadequate for assessing PA pressure.  3. The mitral valve is normal in structure. Moderate mitral valve regurgitation. No evidence of mitral stenosis.  4. Tricuspid valve regurgitation is moderate to severe.  5. The aortic valve has been repaired/replaced. Aortic valve regurgitation is not visualized. Gradients were not measured.  6. The inferior vena cava is dilated in size with <50% respiratory variability, suggesting right atrial pressure of 15 mmHg. FINDINGS  Left Ventricle: Left ventricular ejection fraction, by estimation, is <20%. The left ventricle has severely decreased function. Left ventricular endocardial border not optimally defined to evaluate regional wall motion. The left ventricular internal cavity size was normal in size. There is moderate left ventricular hypertrophy. Right Ventricle: The right ventricular size is mildly enlarged. No increase in right ventricular wall thickness. Right ventricular systolic function is moderately reduced. Tricuspid regurgitation signal is inadequate for assessing PA pressure. Left Atrium: Left atrial size was normal in size. Right Atrium: Right atrial size was normal in size. Pericardium: There is no evidence of pericardial effusion. Mitral Valve: The mitral valve is normal in structure. Moderate mitral valve regurgitation. No  evidence of mitral valve stenosis. Tricuspid Valve: The tricuspid valve is normal in structure. Tricuspid valve regurgitation is moderate to severe. No evidence of tricuspid stenosis. Aortic Valve: The aortic valve has been repaired/replaced. Aortic valve regurgitation is not visualized. Pulmonic Valve: The pulmonic valve was normal in structure. Pulmonic valve regurgitation is not visualized. No evidence of pulmonic stenosis. Aorta: The aortic root is normal in size and structure. Venous: The inferior vena cava is dilated in size with less than 50% respiratory variability, suggesting right atrial pressure of 15 mmHg. IAS/Shunts: No atrial level shunt detected by color flow Doppler. Additional Comments: A device lead is visualized. Color Doppler performed.  LEFT VENTRICLE PLAX 2D LVIDd:         5.80 cm LVIDs:         6.20 cm LV PW:         1.80 cm LV IVS:        1.10 cm  LV Volumes (MOD) LV vol d, MOD A2C: 150.0 ml LV vol d, MOD A4C: 188.0 ml LV vol s, MOD A2C: 122.0 ml LV vol s, MOD A4C: 145.0 ml LV SV MOD A2C:     28.0 ml LV SV MOD A4C:     188.0 ml LV SV MOD BP:      32.9 ml RIGHT VENTRICLE         IVC TAPSE (M-mode): 1.5 cm  IVC diam: 2.85 cm Deatrice Cage MD Electronically signed by Deatrice Cage MD Signature Date/Time: 05/05/2024/4:35:15  PM    Final    US  ABDOMEN LIMITED RUQ (LIVER/GB) Result Date: 05/05/2024 EXAM: Right Upper Quadrant Abdominal Ultrasound 05/05/2024 03:57:44 AM TECHNIQUE: Real-time ultrasonography of the right upper quadrant of the abdomen was performed. COMPARISON: US  Abdomen 03/07/2024. CLINICAL HISTORY: LFTs abnormal. FINDINGS: LIVER: Liver length measures 1.7 x 1.6 x 1.3 cm. The liver is heterogeneously echogenic and demonstrates a nodular ventral contour characteristic of cirrhosis. There is a simple cyst within the right hepatic lobe measuring approximately 16 mm in diameter. There is normal hepatopetal blood flow within the main portal vein. No intrahepatic biliary ductal  dilatation. BILIARY SYSTEM: Gallbladder wall thickness measures 0.4 cm. No pericholecystic fluid. No cholelithiasis. Negative sonographic Murphy's sign. The patient is not focally tender over the gallbladder. Common bile duct measures 0.3 cm. OTHER: No right upper quadrant ascites. IMPRESSION: 1. Heterogeneous echogenicity and nodular ventral hepatic contour, characteristic of cirrhosis. Electronically signed by: Evalene Coho MD 05/05/2024 04:39 AM EST RP Workstation: [id number]   CT Head Wo Contrast Result Date: 05/05/2024 EXAM: CT HEAD WITHOUT CONTRAST 05/05/2024 01:27:13 AM TECHNIQUE: CT of the head was performed without the administration of intravenous contrast. Automated exposure control, iterative reconstruction, and/or weight based adjustment of the mA/kV was utilized to reduce the radiation dose to as low as reasonably achievable. COMPARISON: 04/30/2024 CLINICAL HISTORY: Mental status change, unknown cause. FINDINGS: BRAIN AND VENTRICLES: No acute hemorrhage. No evidence of acute infarct. No hydrocephalus. No extra-axial collection. No mass effect or midline shift. There is diffuse cerebral atrophy and chronic small vessel disease throughout the deep white matter. ORBITS: No acute abnormality. SINUSES: No acute abnormality. SOFT TISSUES AND SKULL: No acute soft tissue abnormality. No skull fracture. IMPRESSION: 1. No acute intracranial abnormality. 2. Diffuse cerebral atrophy and chronic small vessel disease throughout the deep white matter. Electronically signed by: Franky Crease MD 05/05/2024 01:32 AM EST RP Workstation: [id number]   DG Chest Port 1 View Result Date: 05/05/2024 EXAM: 1 VIEW XRAY OF THE CHEST 05/05/2024 01:17:36 AM COMPARISON: 04/30/2024 CLINICAL HISTORY: AMS hypoxemia hx CHF FINDINGS: LINES, TUBES AND DEVICES: Left AICD is unchanged. LUNGS AND PLEURA: Vascular congestion. Suspect early interstitial edema. No focal pulmonary opacity. No pleural effusion. No pneumothorax. HEART AND  MEDIASTINUM: Cardiomegaly. No acute abnormality of the mediastinal silhouette. BONES AND SOFT TISSUES: No acute osseous abnormality. IMPRESSION: 1. Cardiomegaly with vascular congestion and suspected early interstitial edema. Electronically signed by: Franky Crease MD 05/05/2024 01:32 AM EST RP Workstation: [id number]        Scheduled Meds:  allopurinol   100 mg Oral Daily   budesonide-glycopyrrolate-formoterol   2 puff Inhalation BID   buPROPion   300 mg Oral Daily   enoxaparin  (LOVENOX ) injection  1 mg/kg Subcutaneous Q12H   fenofibrate   54 mg Oral Daily   furosemide   40 mg Intravenous BID   insulin  aspart  0-9 Units Subcutaneous TID WC   lidocaine   1 patch Transdermal Q24H   mexiletine  150 mg Oral TID   midodrine   10 mg Oral TID WC   pantoprazole   40 mg Oral BID   Continuous Infusions:   LOS: 0 days      Calvin KATHEE Robson, MD [facility]   If 7PM-7AM, please contact night-coverage  05/06/2024, 3:06 PM

## 2024-05-06 NOTE — TOC Initial Note (Signed)
 Transition of Care [facility]) - Initial/Assessment Note    Patient Details  Name: Roberto [name] MRN: [id number] Date of Birth: 07/26/1955  Transition of Care [facility]) CM/SW Contact:    Racheal LITTIE Schimke, RN Phone Number: 05/06/2024, 4:20 PM  Clinical Narrative: Spoke with patient and wife at bedside. Live in [facility], with one step to enter. DME- C-Pap for 3 weeks,  Bariatric BSC, Rollator not working well. No HHS, however agreeable. Wife supportive, assist with ADL's, cooks and transport.                       Patient Goals and CMS Choice            Expected Discharge Plan and Services                                              Prior Living Arrangements/Services                       Activities of Daily Living   ADL Screening (condition at time of admission) Independently performs ADLs?: Yes (appropriate for developmental age) Is the patient deaf or have difficulty hearing?: Yes Does the patient have difficulty seeing, even when wearing glasses/contacts?: Yes Does the patient have difficulty concentrating, remembering, or making decisions?: Yes  Permission Sought/Granted                  Emotional Assessment              Admission diagnosis:  Lactic acidosis [E87.20] Acute pulmonary edema (HCC) [J81.0] AKI (acute kidney injury) [N17.9] Altered mental status, unspecified altered mental status type [R41.82] Acute on chronic congestive heart failure, unspecified heart failure type (HCC) [I50.9] Patient Active Problem List   Diagnosis Date Noted   Acute pulmonary edema (HCC) 05/05/2024   Acute respiratory failure with hypoxia (HCC) 05/05/2024   Elevated troponin 05/05/2024   Transaminitis 05/05/2024   Prolonged QT interval 05/05/2024   Normocytic anemia 05/01/2024   Gout 05/01/2024   Paroxysmal atrial fibrillation (HCC) 05/01/2024   Type 2 diabetes mellitus with chronic kidney disease, without long-term current use of insulin  (HCC)  05/01/2024   Dyslipidemia 05/01/2024   Essential hypertension 05/01/2024   Hypokalemia 05/01/2024   Encephalopathy 04/30/2024   Esophageal dysmotility 03/28/2024   Indigestion 03/24/2024   Unintentional weight loss 03/07/2024   Presence of automatic (implantable) cardiac defibrillator 03/07/2024   Presence of heart assist device (HCC) 02/15/2024   Polyp of colon 12/13/2023   PTSD (post-traumatic stress disorder) 11/15/2023   DM type 2 with diabetic peripheral neuropathy (HCC) 10/23/2023   Essential tremor 10/23/2023   Pacemaker 09/11/2023   Persistent atrial fibrillation (HCC) 07/12/2023   Acute on chronic systolic CHF (congestive heart failure) (HCC) 06/22/2023   VT (ventricular tachycardia) (HCC) 06/18/2023   Hypotension 06/18/2023   Atrial fibrillation (HCC) 06/17/2023   Depression 06/17/2023   Tremor of left hand 05/29/2022   Aneurysm of ascending aorta without rupture 02/17/2022   Anticoagulated on Coumadin  07/29/2021   Chronic kidney disease, stage 3a (HCC) 12/09/2020   Hyperlipidemia associated with type 2 diabetes mellitus (HCC) 12/09/2020   Coronary artery disease involving native coronary artery of native heart without angina pectoris 09/10/2019   Osteoarthritis of spine with radiculopathy, lumbar region 11/21/2018   History of aortic stenosis 06/05/2018   Chronic gout without tophus 04/03/2018  Valvular heart disease 01/17/2018   Morbid obesity (HCC) 01/17/2018   Chronic HFrEF (heart failure with reduced ejection fraction) (HCC) 09/27/2016   Renal infarct 05/01/2016   Type 2 diabetes mellitus with cardiac complication (HCC) 04/19/2016   Chronic bilateral low back pain without sciatica 04/19/2016   Thoracic aortic aneurysm without rupture 03/16/2016   Aortic atherosclerosis 03/16/2016   Nicotine  dependence, cigarettes, uncomplicated 03/14/2016   NICM (nonischemic cardiomyopathy) (HCC) 03/02/2016   COPD (chronic obstructive pulmonary disease) (HCC) 09/09/2015   H/O  mechanical aortic valve replacement 02/09/2015   Hypertension associated with diabetes (HCC) 02/09/2015   PCP:  [name], DO Pharmacy:   [facility] [address]), [place] - [address] [place]) [place] [address] Phone: [phone] Fax: [phone]  [name] [address] [place] [address] Phone: [phone] Fax: [phone]     Social Drivers of Health (SDOH) Social History: SDOH Screenings   Food Insecurity: Food Insecurity Present (05/05/2024)  Housing: Low Risk  (05/05/2024)  Transportation Needs: No Transportation Needs (05/05/2024)  Recent Concern: Transportation Needs - Unmet Transportation Needs (05/01/2024)  Utilities: Not At Risk (05/05/2024)  Alcohol Screen: Low Risk  (09/04/2023)  Depression (PHQ2-9): High Risk (03/28/2024)  Financial Resource Strain: High Risk (04/21/2024)  Physical Activity: Inactive (04/21/2024)  Social Connections: Socially Isolated (05/05/2024)  Stress: Stress Concern Present (04/21/2024)  Tobacco Use: High Risk (05/02/2024)  Health Literacy: Adequate Health Literacy (09/04/2023)   SDOH Interventions:     Readmission Risk Interventions    08/29/2023    3:19 PM 06/18/2023   10:33 AM  Readmission Risk Prevention Plan  Transportation Screening Complete Complete  PCP or Specialist Appt within 3-5 Days  Complete  HRI or Home Care Consult Complete Complete  Social Work Consult for Recovery Care Planning/Counseling Complete Patient refused  Palliative Care Screening Not Applicable Not Applicable  Medication Review Oceanographer) Referral to Pharmacy Complete

## 2024-05-06 NOTE — Plan of Care (Signed)

## 2024-05-06 NOTE — Care Management Obs Status (Signed)
 MEDICARE OBSERVATION STATUS NOTIFICATION   Patient Details  Name: Roberto [name] MRN: [id number] Date of Birth: 03/08/1956   Medicare Observation Status Notification Given:  Yes    [name] 05/06/2024, 11:39 AM

## 2024-05-06 NOTE — Progress Notes (Signed)
 Heart Failure Navigator Progress Note Current Advanced Heart Failure Team patient.  Patient had a previously scheduled HF appointment on 05/27/24 @ 10:00.  It was rescheduled to 05/13/24 @ 8:30 AM because of this recent hospitalization. Patient also provided with HF education below.   Education Assessment and Provision: Detailed education and instructions provided on heart failure disease management including the following:  Signs and symptoms of Heart Failure When to call the physician Importance of daily weights:Patient has not routinely been doing these biut willing to start.  They do have a scale at home. Low sodium diet Fluid restriction Medication management- Wife provides patient with daily medications and uses a pill box. Anticipated future follow-up appointments-Next appointment adjusted to a more appropriate time in regards to this recent admission.  Patient education given on each of the above topics.  Patient acknowledges understanding via teach back method and acceptance of all instructions.  Education Materials:  Living Better With Heart Failure Booklet, HF zone tool, & Daily Weight Tracker Tool.  Patient has scale at home: Yes Patient has pill box at home: Yes    Navigator will sign off at this time.  Charmaine Pines, RN, BSN [facility] Heart Failure Navigator Secure Chat Only

## 2024-05-06 NOTE — Progress Notes (Signed)
 Progress Note  Patient Name: Roberto [name] Date of Encounter: 05/06/2024 [facility] Cardiologist: [name], MD   Interval Summary   Patient feels well today without chest pain or shortness of breath.  He notes that he voided quite a bit yesterday with improved edema.  Vital Signs Vitals:   05/06/24 0500 05/06/24 0733 05/06/24 1236 05/06/24 1600  BP:  97/69 117/87 106/85  Pulse:  65 80 83  Resp:  15 16 15   Temp:  98.1 F (36.7 C) 97.9 F (36.6 C) 98.5 F (36.9 C)  TempSrc:  Oral    SpO2:  (S) (!) 84% 100% 100%  Weight: 90.3 kg       Intake/Output Summary (Last 24 hours) at 05/06/2024 1730 Last data filed at 05/05/2024 2038 Gross per 24 hour  Intake 120 ml  Output 200 ml  Net -80 ml      05/06/2024    5:00 AM 05/02/2024    7:17 PM 04/30/2024    7:21 PM  Last 3 Weights  Weight (lbs) 199 lb 1.2 oz 196 lb 12.8 oz 243 lb 9.7 oz  Weight (kg) 90.3 kg 89.268 kg 110.5 kg      Telemetry/ECG  Predominantly ventricular paced rhythm with occasional fusion beats and PVCs - Personally Reviewed  Physical Exam  GEN: No acute distress.   Neck: No JVD Cardiac: RRR with mechanical S2.  No murmurs, rubs, or gallops.  Respiratory: Mildly diminished breath sounds throughout without wheezes or crackles. GI: Soft, nontender, non-distended  MS: No edema  Assessment & Plan  Acute on chronic HFrEF: Roberto [name] low was readmitted with acute heart failure (noted to have severe biventricular failure by echo and most recent RHC in 03/2024).  Volume status appears improved today, though he is recorded as only being net -600 mL yesterday.  His weight is down 10 kg from his last heart failure visit with Roberto Class, NP, on 04/23/2024.  His creatinine improved with diuresis yesterday, presumably from venous decongestion in the setting of right heart failure.  I recommend diuresing for at least 1 more day with furosemide  40 mg IV twice daily and rechecking his weight tomorrow.  We  spoke at length regarding his severe biventricular failure and limited treatment options.  His age and comorbidities would likely preclude him from receiving a cardiac transplant.  Additionally, his thoracic aortic aneurysm and RV failure could limit his ability to receive an LVAD.  I do not think that his continued medical therapy (which has been severely limited by hypotension) will provide good long-term outcomes.  I have recommended either close follow-up (or even inpatient transfer) to the advanced heart failure team at [facility] versus transitioning to hospice.  Roberto [name] below is more inclined to pursue a conservative approach so that he can spend as much time as possible at home.  However, his significant other would much prefer that he pursue all therapeutic options before transitioning to a palliative approach.  We have agreed to obtain a palliative care consultation so that his goals of care can be better delineated and also to introduce him and his significant other to the role of hospice and [place]-of-life care.  If possible, we will try to optimize Roberto [name]'s volume status in the next 1 to 2 days and allow him to go home with close outpatient follow-up.  Ideally, he would see Dr. Venancio or one of the other advanced heart failure providers in [place] within 3 to 4 days of being discharged from  [facility].  If at that point, he wishes to pursue aggressive heart failure therapy and he is felt to be decompensated again, direct admission for possible invasive hemodynamic monitoring and inotropic support could be considered.  Hypotension: BP remains soft but relatively stable.  Continue current dose of midodrine  10 mg three times daily.  Ventricular tachycardia: No ventricular tachycardia noted.  Roberto [name]'s significant other is concerned about him remaining on mexiletine, I have given potential interaction with bupropion  and concerns and long-term administration in patients with advanced heart  failure.  I advised her that this medication, which was initiated by EP at [facility] during his most recent admission with VT in 02/2024, is often used as adjunctive therapy for management of recurrent VT.  We will continue his current dose of mexiletine and have him follow-up with EP (he has an outpatient appointment with Dr. Waddell later this week).  Amiodarone  is currently on hold, presumably due to abnormal LFTs.  Hopefully, amiodarone  can be resumed as his LFTs improved.  Thoracic aortic aneurysm: No symptoms to suggest dissection/rupture.  Continue surveillance through cardiac surgery.  History of mechanical aortic valve replacement: Continue therapeutic dose enoxaparin  in the setting of labile INR is on warfarin.  Transaminitis: I suspect this is most likely due to congestive hepatopathy and possibly hypoperfusion from his decompensated heart failure.  Amiodarone  and rosuvastatin  on hold at the moment.  I will discontinue fenofibrate  as well.  60 minutes were spent on this encounter, of which 45 minutes were spent speaking with Roberto [name] and his significant other on 2 separate occasions about his cardiac disease, prognosis, and treatment options.  Case also personally discussed with Dr. Jhonny.   For questions or updates, please contact [facility] Please consult [email or web address] for contact info under [facility] Cardiology.  Signed, [name], MD

## 2024-05-07 ENCOUNTER — Ambulatory Visit: Admitting: Family Medicine

## 2024-05-07 ENCOUNTER — Encounter: Payer: Self-pay | Admitting: Family Medicine

## 2024-05-07 DIAGNOSIS — I4892 Unspecified atrial flutter: Secondary | ICD-10-CM | POA: Diagnosis present

## 2024-05-07 DIAGNOSIS — R4182 Altered mental status, unspecified: Secondary | ICD-10-CM | POA: Diagnosis present

## 2024-05-07 DIAGNOSIS — I4891 Unspecified atrial fibrillation: Secondary | ICD-10-CM | POA: Diagnosis not present

## 2024-05-07 DIAGNOSIS — F17210 Nicotine dependence, cigarettes, uncomplicated: Secondary | ICD-10-CM | POA: Diagnosis present

## 2024-05-07 DIAGNOSIS — I255 Ischemic cardiomyopathy: Secondary | ICD-10-CM | POA: Diagnosis not present

## 2024-05-07 DIAGNOSIS — N179 Acute kidney failure, unspecified: Secondary | ICD-10-CM | POA: Diagnosis present

## 2024-05-07 DIAGNOSIS — Z6836 Body mass index (BMI) 36.0-36.9, adult: Secondary | ICD-10-CM | POA: Diagnosis not present

## 2024-05-07 DIAGNOSIS — I272 Pulmonary hypertension, unspecified: Secondary | ICD-10-CM | POA: Diagnosis present

## 2024-05-07 DIAGNOSIS — Z952 Presence of prosthetic heart valve: Secondary | ICD-10-CM

## 2024-05-07 DIAGNOSIS — R7401 Elevation of levels of liver transaminase levels: Secondary | ICD-10-CM

## 2024-05-07 DIAGNOSIS — E7800 Pure hypercholesterolemia, unspecified: Secondary | ICD-10-CM | POA: Diagnosis present

## 2024-05-07 DIAGNOSIS — E872 Acidosis, unspecified: Secondary | ICD-10-CM

## 2024-05-07 DIAGNOSIS — I959 Hypotension, unspecified: Secondary | ICD-10-CM

## 2024-05-07 DIAGNOSIS — Z7189 Other specified counseling: Secondary | ICD-10-CM | POA: Diagnosis not present

## 2024-05-07 DIAGNOSIS — E876 Hypokalemia: Secondary | ICD-10-CM | POA: Diagnosis present

## 2024-05-07 DIAGNOSIS — I130 Hypertensive heart and chronic kidney disease with heart failure and stage 1 through stage 4 chronic kidney disease, or unspecified chronic kidney disease: Secondary | ICD-10-CM | POA: Diagnosis present

## 2024-05-07 DIAGNOSIS — I5023 Acute on chronic systolic (congestive) heart failure: Secondary | ICD-10-CM | POA: Diagnosis not present

## 2024-05-07 DIAGNOSIS — T671XXA Heat syncope, initial encounter: Secondary | ICD-10-CM | POA: Diagnosis not present

## 2024-05-07 DIAGNOSIS — I428 Other cardiomyopathies: Secondary | ICD-10-CM | POA: Diagnosis present

## 2024-05-07 DIAGNOSIS — R55 Syncope and collapse: Secondary | ICD-10-CM | POA: Diagnosis not present

## 2024-05-07 DIAGNOSIS — J449 Chronic obstructive pulmonary disease, unspecified: Secondary | ICD-10-CM | POA: Diagnosis present

## 2024-05-07 DIAGNOSIS — I5084 End stage heart failure: Secondary | ICD-10-CM | POA: Diagnosis present

## 2024-05-07 DIAGNOSIS — I10 Essential (primary) hypertension: Secondary | ICD-10-CM

## 2024-05-07 DIAGNOSIS — R296 Repeated falls: Secondary | ICD-10-CM | POA: Diagnosis not present

## 2024-05-07 DIAGNOSIS — Z515 Encounter for palliative care: Secondary | ICD-10-CM | POA: Diagnosis not present

## 2024-05-07 DIAGNOSIS — E1122 Type 2 diabetes mellitus with diabetic chronic kidney disease: Secondary | ICD-10-CM | POA: Diagnosis present

## 2024-05-07 DIAGNOSIS — I712 Thoracic aortic aneurysm, without rupture, unspecified: Secondary | ICD-10-CM | POA: Diagnosis present

## 2024-05-07 DIAGNOSIS — N1831 Chronic kidney disease, stage 3a: Secondary | ICD-10-CM

## 2024-05-07 DIAGNOSIS — K761 Chronic passive congestion of liver: Secondary | ICD-10-CM | POA: Diagnosis present

## 2024-05-07 DIAGNOSIS — N1832 Chronic kidney disease, stage 3b: Secondary | ICD-10-CM | POA: Diagnosis present

## 2024-05-07 DIAGNOSIS — Z4502 Encounter for adjustment and management of automatic implantable cardiac defibrillator: Secondary | ICD-10-CM | POA: Diagnosis not present

## 2024-05-07 DIAGNOSIS — I5082 Biventricular heart failure: Secondary | ICD-10-CM | POA: Diagnosis present

## 2024-05-07 DIAGNOSIS — G9341 Metabolic encephalopathy: Secondary | ICD-10-CM | POA: Diagnosis present

## 2024-05-07 DIAGNOSIS — I4819 Other persistent atrial fibrillation: Secondary | ICD-10-CM | POA: Diagnosis present

## 2024-05-07 DIAGNOSIS — I4821 Permanent atrial fibrillation: Secondary | ICD-10-CM | POA: Diagnosis not present

## 2024-05-07 DIAGNOSIS — I442 Atrioventricular block, complete: Secondary | ICD-10-CM | POA: Diagnosis not present

## 2024-05-07 DIAGNOSIS — I5022 Chronic systolic (congestive) heart failure: Secondary | ICD-10-CM | POA: Diagnosis not present

## 2024-05-07 DIAGNOSIS — I5043 Acute on chronic combined systolic (congestive) and diastolic (congestive) heart failure: Secondary | ICD-10-CM | POA: Diagnosis present

## 2024-05-07 DIAGNOSIS — I472 Ventricular tachycardia, unspecified: Secondary | ICD-10-CM | POA: Diagnosis present

## 2024-05-07 DIAGNOSIS — J9601 Acute respiratory failure with hypoxia: Secondary | ICD-10-CM | POA: Diagnosis present

## 2024-05-07 DIAGNOSIS — J810 Acute pulmonary edema: Secondary | ICD-10-CM | POA: Diagnosis not present

## 2024-05-07 LAB — PROTIME-INR
INR: 1.3 — ABNORMAL HIGH (ref 0.8–1.2)
Prothrombin Time: 17.1 s — ABNORMAL HIGH (ref 11.4–15.2)

## 2024-05-07 LAB — COMPREHENSIVE METABOLIC PANEL WITH GFR
ALT: 257 U/L — ABNORMAL HIGH (ref 0–44)
AST: 208 U/L — ABNORMAL HIGH (ref 15–41)
Albumin: 3 g/dL — ABNORMAL LOW (ref 3.5–5.0)
Alkaline Phosphatase: 68 U/L (ref 38–126)
Anion gap: 12 (ref 5–15)
BUN: 21 mg/dL (ref 8–23)
CO2: 24 mmol/L (ref 22–32)
Calcium: 8.6 mg/dL — ABNORMAL LOW (ref 8.9–10.3)
Chloride: 101 mmol/L (ref 98–111)
Creatinine, Ser: 1.57 mg/dL — ABNORMAL HIGH (ref 0.61–1.24)
GFR, Estimated: 47 mL/min — ABNORMAL LOW (ref >60)
Glucose, Bld: 111 mg/dL — ABNORMAL HIGH (ref 70–99)
Potassium: 3.2 mmol/L — ABNORMAL LOW (ref 3.5–5.1)
Sodium: 137 mmol/L (ref 135–145)
Total Bilirubin: 1 mg/dL (ref 0.0–1.2)
Total Protein: 6.8 g/dL (ref 6.5–8.1)

## 2024-05-07 LAB — MAGNESIUM: Magnesium: 2.2 mg/dL (ref 1.7–2.4)

## 2024-05-07 MED ORDER — WARFARIN SODIUM 5 MG PO TABS
5.0000 mg | ORAL_TABLET | Freq: Once | ORAL | Status: DC
  Filled 2024-05-07: qty 1

## 2024-05-07 MED ORDER — POTASSIUM CHLORIDE CRYS ER 20 MEQ PO TBCR
40.0000 meq | EXTENDED_RELEASE_TABLET | Freq: Once | ORAL | Status: AC
  Administered 2024-05-07: 40 meq via ORAL
  Filled 2024-05-07: qty 2

## 2024-05-07 MED ORDER — WARFARIN - PHARMACIST DOSING INPATIENT: Freq: Every day | Status: DC

## 2024-05-07 MED ORDER — WARFARIN SODIUM 7.5 MG PO TABS
7.5000 mg | ORAL_TABLET | Freq: Once | ORAL | Status: AC
  Administered 2024-05-07: 7.5 mg via ORAL
  Filled 2024-05-07: qty 1

## 2024-05-07 MED ORDER — POTASSIUM CHLORIDE 10 MEQ/100ML IV SOLN
10.0000 meq | INTRAVENOUS | Status: AC
  Administered 2024-05-07 (×2): 10 meq via INTRAVENOUS
  Filled 2024-05-07 (×2): qty 100

## 2024-05-07 NOTE — Progress Notes (Signed)
 Physical Therapy Treatment Patient Details Name: Roberto [name] MRN: [id number] DOB: April 11, 1956 Today's Date: 05/07/2024   History of Present Illness Pt is a 68 y.o. male here with altered mental status, encephalopathy, acute on chronic HFrEF, acute respiratory failure with hypoxia. Seen in ED 10/31 after a fall. Anticipate ICU admit for low BP. PMH of mechanical heart valve on anticoagulation, heart failure, type II diabetic, CKD, liver disease, hypertension, dyslipidemia, CVA/TIA, chronic HFrEF, s/p ICD.    PT Comments  Daughter assisting pt with [facility] toileting upon arrival. Pt stated that he gets tremors but agreeable to session. Pt was A and O x4 and was motivated to get up and move around. At times the pt was hard of hearing and it helped to communicate with the daughter. Monitored HR and O2 during ambulation and values were WNL Pt performed sit to stand to RW with supervision A. Ambulated 250 ft successfully with only one break to sit in chair. Pt had a narrow BOS and would step through with a tandem gait. PTA provided supervision A during most of ambulation but required CGA during turns. Pt performed one step up and step down using RW while PTA provided CGA. Discharge recs changed to OPPT due to pt/daughter request.   If plan is discharge home, recommend the following: A little help with walking and/or transfers;A little help with bathing/dressing/bathroom;Assistance with cooking/housework;Direct supervision/assist for medications management;Direct supervision/assist for financial management;Assist for transportation;Help with stairs or ramp for entrance     Equipment Recommendations  None recommended by PT       Precautions / Restrictions Precautions Precautions: Fall Recall of Precautions/Restrictions: Intact Restrictions Weight Bearing Restrictions Per Provider Order: No     Mobility    Transfers Overall transfer level: Needs assistance Equipment used: Rolling walker (2  wheels) Transfers: Sit to/from Stand Sit to Stand: Contact guard assist, Supervision   Ambulation/Gait Ambulation/Gait assistance: Supervision, Contact guard assist (Mostly supervision, CGA during turning or to stop pt) Gait Distance (Feet): 250 Feet Assistive device: Rolling walker (2 wheels) Gait Pattern/deviations: Step-through pattern, Narrow base of support  General Gait Details: Narrow BOS with one foot stepping in front of other   Stairs Stairs: Yes Stairs assistance: Contact guard assist Stair Management: No rails, Step to pattern, Forwards, With walker Number of Stairs: 5 General stair comments: Performed forward single step up with RW and step down. Communicated proper mechanics to pt.    Balance Overall balance assessment: Needs assistance Sitting-balance support: Bilateral upper extremity supported, Feet supported Sitting balance-Leahy Scale: Good     Standing balance support: Bilateral upper extremity supported, During functional activity, Reliant on assistive device for balance Standing balance-Leahy Scale: Fair Standing balance comment: Pt reliant on RW for balance     Communication Communication Communication: Impaired Factors Affecting Communication: Hearing impaired  Cognition Arousal: Alert Behavior During Therapy: WFL for tasks assessed/performed   PT - Cognitive impairments: History of cognitive impairments      PT - Cognition Comments: Lives with daughter. Following commands: Intact      Cueing Cueing Techniques: Verbal cues     General Comments General comments (skin integrity, edema, etc.): During ambulation HR and O2 WNL when checked halfway through      Pertinent Vitals/Pain Pain Assessment Pain Assessment: No/denies pain Pain Score: 0-No pain Pain Intervention(s): Monitored during session, Repositioned     PT Goals (current goals can now be found in the care plan section) Acute Rehab PT Goals Patient Stated Goal: return  home Progress towards PT  goals: Progressing toward goals    Frequency    Min 2X/week       AM-PAC PT 6 Clicks Mobility   Outcome Measure  Help needed turning from your back to your side while in a flat bed without using bedrails?: A Little Help needed moving from lying on your back to sitting on the side of a flat bed without using bedrails?: A Little Help needed moving to and from a bed to a chair (including a wheelchair)?: A Little Help needed standing up from a chair using your arms (e.g., wheelchair or bedside chair)?: A Little Help needed to walk in hospital room?: A Little Help needed climbing 3-5 steps with a railing? : A Little 6 Click Score: 18    End of Session Equipment Utilized During Treatment: Gait belt Activity Tolerance: Patient tolerated treatment well Patient left: in chair;with call bell/phone within reach;with chair alarm set Nurse Communication: Mobility status PT Visit Diagnosis: Other abnormalities of gait and mobility (R26.89);Muscle weakness (generalized) (M62.81);Difficulty in walking, not elsewhere classified (R26.2)     Time: 8566-8547 PT Time Calculation (min) (ACUTE ONLY): 19 min  Charges:    $Gait Training: 8-22 mins PT General Charges $$ ACUTE PT VISIT: 1 Visit                     Signe Artemis Loyal SPTA

## 2024-05-07 NOTE — Consult Note (Cosign Needed Addendum)
 Consultation Note Date: 05/07/2024   Patient Name: Roberto [name]  DOB: [birth year]  MRN: [id number]  Age / Sex: 68 y.o., male  PCP: [name], DO Referring Physician: [name], MD  Reason for Consultation: Establishing goals of care  HPI/Patient Profile:  Roberto [name] is a 68 y.o. male with medical history significant of essential hypertension, dyslipidemia, bicuspid aortic valve status post mechanical valve replacement on Lovenox ,, CVA/TIA, CKD stage IIIb, chronic HFrEF, s/p ICD presenting with encephalopathy, acute on chronic HFrEF, acute respiratory failure with hypoxia.   Clinical Assessment and Goals of Care: Notes and labs reviewed.  In to see patient.  He is currently sitting in bedside chair with his significant other [name] with him.  Patient discusses that he does not have children or other family members, it is just the 2 of them.  They state they have been together since 2007.  We discussed his diagnosis, prognosis, GOC, EOL wishes disposition and options.  They discuss that they have reviewed options of going to the advanced heart failure clinic at [facility], and also options of hospice care.  A detailed discussion was had today regarding advanced directives.  Concepts specific to code status, ventilator support s and rehospitalization were discussed.  The difference between an aggressive medical intervention path and a comfort care path was discussed.  Patient's ICD was discussed.  Values and goals of care important to patient and family were attempted to be elicited.  Discussed limitations of medical interventions to prolong quality of life in some situations and discussed the concept of human mortality.   They discuss they are currently they are making decisions on how to move forward.  Patient states he would like to have more time on this earth.  His significant other states they would like  for him to be followed at the heart failure clinic in [place].  He states what is most important, and at the forefront of their mind at this time, is that they have an appointment at 1:00 tomorrow at the court house to be married.  He states he will continue to consider his options regarding boundaries and limits to care including CODE STATUS.  SUMMARY OF RECOMMENDATIONS   Continue full code and full scope. Patient is hopeful to be discharged and time to be at the court house tomorrow 1:00 to get married       Primary Diagnoses: Present on Admission:  Acute pulmonary edema (HCC)  Encephalopathy  Hypotension  Chronic kidney disease, stage 3a (HCC)  COPD (chronic obstructive pulmonary disease) (HCC)  Type 2 diabetes mellitus with chronic kidney disease, without long-term current use of insulin  (HCC)  Nicotine  dependence, cigarettes, uncomplicated  Essential hypertension  Ventricular tachycardia (HCC)  Acute on chronic HFrEF (heart failure with reduced ejection fraction) (HCC)   I have reviewed the medical record, interviewed the patient and family, and examined the patient. The following aspects are pertinent.  Past Medical History:  Diagnosis Date   Aneurysm    Bicuspid aortic valve    a. s/p #  27 Carbomed[employer] mechanical valve on 03/25/2010; b. on Coumadin ; c. TTE 12/17: EF 40-45%, moderately dilated LV with moderate LVH, AVR well-seated with 14 mmHg gradient, peak AV velocity 2.5 m/s, mild mitral valve thickening with mild MR, mildly dilated RV with mildly reduced contraction   Cellulitis    CHF (congestive heart failure) (HCC)    Chronic kidney disease    Chronic systolic CHF (congestive heart failure) (HCC)    a. R/LHC 03/2010 showed no significant CAD, LVEDP 31 mmHg, mean AoV gradient 34 mmHg at rest and 47 mmHg with dobutamine 20 mcg/kg/min, AVA 1.0 cm^2, RA 31, RV 68/25, PA 68/47, PCWP 38. PA sat 65%. [name] 6.2 L/min (Fick) and 5.3 L/min (thermodilution)   Clotting disorder     COPD (chronic obstructive pulmonary disease) (HCC)    H/O mechanical aortic valve replacement 03/25/2010   a. 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   Stage 3 chronic kidney disease (HCC)    Stroke (HCC)    TIA (transient ischemic attack) 05/2014   Social History   Socioeconomic History   Marital status: Divorced    Spouse name: Not on file   Number of children: 1   Years of education: 14   Highest education level: Associate degree: occupational, scientist, product/process development, or vocational program  Occupational History   Occupation: Disabled    Employer: UNEMPLOYED  Tobacco Use   Smoking status: Every Day    Current packs/day: 0.20    Average packs/day: 0.5 packs/day for 48.3 years (23.8 ttl pk-yrs)    Types: Cigarettes    Start date: 1974    Last attempt to quit: 2021   Smokeless tobacco: Never   Tobacco comments:    Caregiver reports patient currently smokes approximately 2 cigarettes/day.  Vaping Use   Vaping status: Never Used  Substance and Sexual Activity   Alcohol use: Never   Drug use: Never   Sexual activity: Not Currently  Other Topics Concern   Not on file  Social History Narrative   Not on file   Social Drivers of Health   Financial Resource Strain: High Risk (04/21/2024)   Overall Financial Resource Strain (CARDIA)    Difficulty of Paying Living Expenses: Hard  Food Insecurity: Food Insecurity Present (05/05/2024)   Hunger Vital Sign    Worried About Running Out of Food in the Last Year: Never true    Ran Out of Food in the Last Year: Sometimes true  Transportation Needs: No Transportation Needs (05/05/2024)   PRAPARE - Administrator, Civil Service (Medical): No    Lack of Transportation (Non-Medical): No  Recent Concern: Transportation Needs - Unmet Transportation Needs (05/01/2024)   PRAPARE -  Administrator, Civil Service (Medical): Yes    Lack of Transportation (Non-Medical): No  Physical Activity: Inactive (04/21/2024)   Exercise Vital Sign    Days of Exercise per Week: 0 days    Minutes of Exercise per Session: Not on file  Stress: Stress Concern Present (04/21/2024)   [employer] of Occupational Health - Occupational Stress Questionnaire    Feeling of Stress: Very much  Social Connections: Socially Isolated (05/05/2024)   Social Connection and Isolation Panel    Frequency of Communication with Friends and Family: Never    Frequency of Social Gatherings with Friends and Family: Never  Attends Religious Services: Never    Active Member of Clubs or Organizations: No    Attends Banker Meetings: Never    Marital Status: Living with partner   Family History  Problem Relation Age of Onset   Arthritis Mother    Dementia Mother    Colon cancer Mother    Arthritis Father    Diabetes Father    Stroke Father    Colon cancer Father    Heart attack Brother    Breast cancer Sister    Seizures Sister    Cancer Brother        brain   Heart disease Brother    Heart attack Brother    Scheduled Meds:  allopurinol   100 mg Oral Daily   budesonide-glycopyrrolate-formoterol   2 puff Inhalation BID   buPROPion   300 mg Oral Daily   enoxaparin  (LOVENOX ) injection  1 mg/kg Subcutaneous Q12H   furosemide   40 mg Intravenous BID   lidocaine   1 patch Transdermal Q24H   mexiletine  150 mg Oral TID   midodrine   10 mg Oral TID WC   pantoprazole   40 mg Oral BID   warfarin  7.5 mg Oral ONCE-1600   Warfarin - Pharmacist Dosing Inpatient   Does not apply q1600   Continuous Infusions: PRN Meds:.morphine injection, prochlorperazine **OR** prochlorperazine, traMADol Medications Prior to Admission:  Prior to Admission medications   Medication Sig Start Date End Date Taking? Authorizing Provider  albuterol  (PROVENTIL ) (2.5 MG/3ML) 0.083% nebulizer solution USE  1 VIAL IN NEBULIZER EVERY 6 HOURS AS NEEDED FOR WHEEZING FOR SHORTNESS OF BREATH 09/25/23  Yes [name], [name], DO  allopurinol  (ZYLOPRIM ) 100 MG tablet Take 1 tablet (100 mg total) by mouth daily. 03/10/24  Yes [name], MD  amiodarone  (PACERONE ) 200 MG tablet Take 2 tablets by mouth twice daily 03/25/24  Yes End, [name], MD  baclofen  (LIORESAL ) 10 MG tablet Take 1 tablet (10 mg total) by mouth 2 (two) times daily. Take before food twice a day, stop if not helping 04/25/24 06/24/24 Yes [name], [name]  buPROPion  (WELLBUTRIN  XL) 300 MG 24 hr tablet Take 300 mg by mouth daily. 08/16/23  Yes [provider]  dapagliflozin  propanediol (FARXIGA ) 10 MG TABS tablet Take 1 tablet (10 mg total) by mouth daily before breakfast. 03/19/24  Yes End, [name], MD  enoxaparin  (LOVENOX ) 120 MG/0.8ML injection INJECT 0.72 ML (108MG ) INTO THE SKIN EVERY 12 HOURS 03/27/24  Yes [name], [name], DO  fenofibrate  (TRICOR ) 48 MG tablet Take 48 mg by mouth daily. 03/05/24  Yes [provider]  Fluticasone -Umeclidin-Vilant (TRELEGY ELLIPTA ) 200-62.5-25 MCG/ACT AEPB Inhale 1 puff into the lungs daily in the afternoon. 02/22/24  Yes [name], MD  lidocaine  (LIDODERM ) 5 % Place 1 patch onto the skin every 12 (twelve) hours for 5 days. Remove & Discard patch within 12 hours or as directed by MD 05/02/24 05/07/24 Yes Janit Kast, [name]  metoprolol  succinate (TOPROL -XL) 50 MG 24 hr tablet Take 1 tablet (50 mg total) by mouth daily. 03/19/24  Yes Rolan Ezra RAMAN, MD  mexiletine (MEXITIL ) 150 MG capsule Take 150 mg by mouth 3 (three) times daily. 02/07/24  Yes [provider]  midodrine  (PROAMATINE ) 2.5 MG tablet Take 1 tablet (2.5 mg total) by mouth 2 (two) times daily with a meal. 03/04/24  Yes [name], [name], DO  nortriptyline  (PAMELOR ) 10 MG capsule Take 1 capsule by mouth at bedtime 04/08/24  Yes [name], [name], DO  Omega-3  Fatty Acids (OMEGA 3 PO) Take 1,500 mg by mouth 2  (two) times daily.   Yes [provider]  ondansetron  (ZOFRAN -ODT) 4 MG disintegrating tablet Take 1 tablet (4 mg total) by mouth every 8 (eight) hours as needed for nausea or vomiting (before eating). 04/25/24 04/20/25 Yes [name], [name]  pantoprazole  (PROTONIX ) 40 MG tablet Take 1 tablet (40 mg total) by mouth 2 (two) times daily. Then go back to once a day Patient taking differently: Take 40 mg by mouth 2 (two) times daily. 04/25/24  Yes [name], [name]  potassium chloride  (MICRO-K ) 10 MEQ CR capsule Take 1 capsule (10 mEq total) by mouth daily. 04/24/24  Yes [name], [name], DO  rosuvastatin  (CRESTOR ) 40 MG tablet Take 1 tablet (40 mg total) by mouth daily. 04/06/23  Yes End, [name], MD  torsemide  (DEMADEX ) 20 MG tablet Take 1 tablet (20 mg total) by mouth daily. 03/21/24  Yes Rolan Ezra RAMAN, MD  traZODone  (DESYREL ) 50 MG tablet Take 0.5 tablets (25 mg total) by mouth at bedtime as needed for sleep. 03/17/24  Yes [name], [name], DO  VENTOLIN  HFA 108 (90 Base) MCG/ACT inhaler INHALE 2 PUFFS BY MOUTH EVERY 6 HOURS AS NEEDED FOR WHEEZING OR SHORTNESS OF BREATH 05/16/23  Yes [name], [name], DO  metFORMIN  (GLUCOPHAGE -XR) 500 MG 24 hr tablet Take 1 tablet by mouth once daily with breakfast Patient not taking: Reported on 05/05/2024 03/19/24   Vicci Bouchard P, DO  Semaglutide , 1 MG/DOSE, (OZEMPIC , 1 MG/DOSE,) 2 MG/1.5ML SOPN Inject 1 mg into the skin once a week. Patient not taking: Reported on 05/05/2024    [provider]  Spacer/Aero-Holding [facility] Use as directed Dx: COPD, J44.9 07/13/23   Vicci Bouchard P, DO  spironolactone  (ALDACTONE ) 25 MG tablet Take 25 mg by mouth daily. Patient not taking: Reported on 05/05/2024 05/04/24   [provider]  traMADol (ULTRAM) 50 MG tablet Take 1 tablet (50 mg total) by mouth every 6 (six) hours as needed for up to 5 days for moderate pain (pain score 4-6) or severe pain (pain score 7-10). 05/02/24 05/07/24   Evans, Alexandra, [name]   Allergies  Allergen Reactions   Latex Hives   Nicotine  Hives and Rash    Patches caused localized rash and hives    Review of Systems  All other systems reviewed and are negative.   Physical Exam Pulmonary:     Effort: Pulmonary effort is normal.  Skin:    General: Skin is warm and dry.     Vital Signs: BP 107/73 (BP Location: Left Arm)   Pulse 82   Temp 98.2 F (36.8 C) (Oral)   Resp 17   Wt 102.2 kg Comment: only blanket and pillow on bed  SpO2 94%   BMI 36.37 kg/m  Pain Scale: 0-10   Pain Score: 7    SpO2: SpO2: 94 % O2 Device:SpO2: 94 % O2 Flow Rate: .O2 Flow Rate (L/min): 0.5 L/min  IO: Intake/output summary:  Intake/Output Summary (Last 24 hours) at 05/07/2024 1613 Last data filed at 05/07/2024 1516 Gross per 24 hour  Intake 2160 ml  Output 1775 ml  Net 385 ml    LBM: Last BM Date : 05/07/24 Baseline Weight: Weight: 90.3 kg Most recent weight: Weight: 102.2 kg (only blanket and pillow on bed)      Signed by: Camelia Lewis, NP   Please contact Palliative Medicine Team phone at [phone] for questions and concerns.  For individual provider:  See Amion

## 2024-05-07 NOTE — Progress Notes (Signed)
 Occupational Therapy Treatment Patient Details Name: Roberto [name] MRN: [id number] DOB: [birth year] Today's Date: 05/07/2024   History of present illness Pt is a 68 y.o. male here with altered mental status, encephalopathy, acute on chronic HFrEF, acute respiratory failure with hypoxia. Seen in ED 10/31 after a fall. Anticipate ICU admit for low BP. PMH of mechanical heart valve on anticoagulation, heart failure, type II diabetic, CKD, liver disease, hypertension, dyslipidemia, CVA/TIA, chronic HFrEF, s/p ICD.   OT comments  Patient seen for OT treatment on this date. Upon arrival to room patient sitting in recliner with s/o present, agreeable to treatment. Focus of session was on safety in bathroom transfers once d/c home, patient has tub/shower combo with no shower seat. OT educated on tub transfer bench vs shower seat and discussed transfer techniques. S/O prefers shower chair, in bathroom OT demonstrated how to step over tub wall and placed barrier on floor to simulate obstacle; patient with difficulty weight shifting into RLE to lift LLE due to R knee instability and required min-mod A to maintain balance. Performed x 2 trials with similar level of A needed. OT also recommending clamp style grab bar, s/o agreeable with recommendations.  Patient ended treatment in recliner with bed/chair alarm on and all needs within reach. Patient making good progress toward goals, will continue to follow POC. Discharge recommendation remains appropriate.        If plan is discharge home, recommend the following:  A little help with walking and/or transfers;A little help with bathing/dressing/bathroom;Help with stairs or ramp for entrance   Equipment Recommendations  None recommended by OT    Recommendations for Other Services      Precautions / Restrictions Precautions Precautions: Fall Recall of Precautions/Restrictions: Intact Restrictions Weight Bearing Restrictions Per Provider Order: No        Mobility Bed Mobility                    Transfers Overall transfer level: Needs assistance Equipment used: Rolling walker (2 wheels) Transfers: Sit to/from Stand Sit to Stand: Contact guard assist, Supervision           General transfer comment: cues for hand placement, pacing; ambulated with 2nd assist for lines/leads management and chair follow d/t pt reports of R knee buckling unexpected however no issues during evaluation     Balance Overall balance assessment: Needs assistance Sitting-balance support: Bilateral upper extremity supported, Feet supported Sitting balance-Leahy Scale: Good     Standing balance support: Bilateral upper extremity supported, During functional activity, Reliant on assistive device for balance Standing balance-Leahy Scale: Fair Standing balance comment: Pt reliant on RW for balance                           ADL either performed or assessed with clinical judgement   ADL                                       Functional mobility during ADLs: Minimal assistance;Rolling walker (2 wheels) General ADL Comments: simulated stepping into tub/shower combo to prep for d/c home, patient required increased steadying A    Extremity/Trunk Assessment              Vision       Perception     Praxis     Communication Communication Communication: Impaired Factors Affecting Communication: Hearing impaired  Cognition Arousal: Alert Behavior During Therapy: WFL for tasks assessed/performed                                 Following commands: Intact        Cueing   Cueing Techniques: Verbal cues  Exercises      Shoulder Instructions       General Comments During ambulation HR and O2 WNL when checked halfway through    Pertinent Vitals/ Pain       Pain Assessment Pain Assessment: No/denies pain Pain Intervention(s): Monitored during session  Home Living                                           Prior Functioning/Environment              Frequency  Min 1X/week        Progress Toward Goals  OT Goals(current goals can now be found in the care plan section)  Progress towards OT goals: Progressing toward goals  Acute Rehab OT Goals Patient Stated Goal: to go home OT Goal Formulation: With patient/family Time For Goal Achievement: 05/19/24 Potential to Achieve Goals: Good ADL Goals Pt Will Perform Lower Body Dressing: with modified independence;sit to/from stand;sitting/lateral leans Pt Will Transfer to Toilet: with modified independence;ambulating Additional ADL Goal #1: Pt will demo implementation of 1 learned ECS during ADL performance 2/2 trials to maximize IND/safety and prevent overexertion.  Plan      Co-evaluation                 AM-PAC OT 6 Clicks Daily Activity     Outcome Measure   Help from another person eating meals?: None Help from another person taking care of personal grooming?: None Help from another person toileting, which includes using toliet, bedpan, or urinal?: A Little Help from another person bathing (including washing, rinsing, drying)?: A Little Help from another person to put on and taking off regular upper body clothing?: None Help from another person to put on and taking off regular lower body clothing?: A Little 6 Click Score: 21    End of Session Equipment Utilized During Treatment: Gait belt;Rolling walker (2 wheels)  OT Visit Diagnosis: Other abnormalities of gait and mobility (R26.89)   Activity Tolerance Patient tolerated treatment well   Patient Left in chair;with call bell/phone within reach;with family/visitor present   Nurse Communication          Time: 8342-8284 OT Time Calculation (min): 18 min  Charges: OT General Charges $OT Visit: 1 Visit OT Treatments $Self Care/Home Management : 8-22 mins  Rogers Clause, OT/L MSOT, 05/07/2024

## 2024-05-07 NOTE — Progress Notes (Signed)
   05/07/24 1300  Spiritual Encounters  Type of Visit Initial  Care provided to: Pt and family  Conversation partners present during encounter Other (comment) (Chaplain colleague, Hart Moats)  Referral source Patient request  Reason for visit Urgent spiritual support  OnCall Visit No  Spiritual Framework  Presenting Themes Goals in life/care;Other (comment) (Needs assistance in obtaining marriage license to get married)  Patient Stress Factors Major life changes  Family Stress Factors Major life changes   Chaplain called to patient room to respond to request for assistance with obtaining a marriage license and attempt to get married while patient in hospital.  Chaplain and colleague contacted register of deeds with patient and fiancee to learn the pertinent and necessary information and process.  Patient and fiancee will give thought to how they want to proceed.  Information about situation was shared with clinical staff and chaplain colleagues in case follow up is requested.

## 2024-05-07 NOTE — Plan of Care (Signed)

## 2024-05-07 NOTE — Progress Notes (Signed)
 Rounding Note   Patient Name: Roberto [name] Date of Encounter: 05/07/2024  [facility] Cardiologist: [name], MD   Subjective Sitting resting in his recliner, no complaints Bucket of candy in front of him, wife brought up to him Denies significant lower extremity edema, abdomen soft Remains on nasal cannula oxygen  Can I go home today No family at the bedside, wife not present for discussion No lab work this morning, order placed for CMP  Scheduled Meds:  allopurinol   100 mg Oral Daily   budesonide-glycopyrrolate-formoterol   2 puff Inhalation BID   buPROPion   300 mg Oral Daily   enoxaparin  (LOVENOX ) injection  1 mg/kg Subcutaneous Q12H   furosemide   40 mg Intravenous BID   lidocaine   1 patch Transdermal Q24H   mexiletine  150 mg Oral TID   midodrine   10 mg Oral TID WC   pantoprazole   40 mg Oral BID   Continuous Infusions:  potassium chloride      PRN Meds: morphine injection, prochlorperazine **OR** prochlorperazine, traMADol   Vital Signs  Vitals:   05/07/24 0325 05/07/24 0439 05/07/24 0726 05/07/24 0823  BP: 94/77  98/80 96/77  Pulse: 80  84 78  Resp: 20  18 17   Temp: 97.8 F (36.6 C)  99 F (37.2 C) 98.2 F (36.8 C)  TempSrc:   Oral Oral  SpO2: 99%  99% 99%  Weight:  102.2 kg      Intake/Output Summary (Last 24 hours) at 05/07/2024 1053 Last data filed at 05/07/2024 1042 Gross per 24 hour  Intake 600 ml  Output 1425 ml  Net -825 ml      05/07/2024    4:39 AM 05/06/2024    5:00 AM 05/02/2024    7:17 PM  Last 3 Weights  Weight (lbs) 225 lb 5 oz 199 lb 1.2 oz 196 lb 12.8 oz  Weight (kg) 102.2 kg 90.3 kg 89.268 kg      Telemetry Paced rhythm- Personally Reviewed  ECG   - Personally Reviewed  Physical Exam  GEN: No acute distress.   Neck: Unable to estimate JVD Cardiac: RRR, no murmurs, rubs, or gallops.  Respiratory: Clear to auscultation bilaterally. GI: Soft, nontender, non-distended  MS: No edema; No  deformity. Neuro:  Nonfocal  Psych: Normal affect   Labs High Sensitivity Troponin:   Recent Labs  Lab 04/30/24 1905 04/30/24 2145 05/05/24 0114 05/05/24 0240  TROPONINIHS 26* 32* 44* 37*     Chemistry Recent Labs  Lab 05/01/24 0424 05/02/24 0348 05/05/24 0114 05/06/24 0324 05/07/24 0826  NA 140 139 136 142 137  K 2.8* 4.4 3.8 4.0 3.2*  CL 103 106 99 105 101  CO2 26 25 20* 28 24  GLUCOSE 89 96 125* 77 111*  BUN 24* 25* 25* 22 21  CREATININE 1.79* 1.73* 2.09* 1.67* 1.57*  CALCIUM  9.0 9.0 8.8* 9.0 8.6*  MG 2.0 2.3 1.8  --   --   PROT  --   --  7.2 6.2* 6.8  ALBUMIN  --  3.2* 3.2* 2.9* 3.0*  AST  --   --  209* 531* 208*  ALT  --   --  119* 301* 257*  ALKPHOS  --   --  63 60 68  BILITOT  --   --  1.2 1.0 1.0  GFRNONAA 41* 42* 34* 44* 47*  ANIONGAP 11 8 17* 9 12    Lipids No results for input(s): CHOL, TRIG, HDL, LABVLDL, LDLCALC, CHOLHDL in the last 168 hours.  Hematology Recent Labs  Lab 05/01/24 0424 05/05/24 0114 05/06/24 0324  WBC 9.7 7.2 5.4  RBC 3.91* 4.52 3.97*  HGB 11.2* 13.1 11.3*  HCT 35.0* 40.7 36.1*  MCV 89.5 90.0 90.9  MCH 28.6 29.0 28.5  MCHC 32.0 32.2 31.3  RDW 16.2* 17.2* 17.3*  PLT 209 258 217   Thyroid   Recent Labs  Lab 05/05/24 0114  TSH 7.753*  FREET4 1.30*    BNP Recent Labs  Lab 05/05/24 0114  BNP 470.9*    DDimer No results for input(s): DDIMER in the last 168 hours.   Radiology  ECHOCARDIOGRAM LIMITED Result Date: 05/05/2024    ECHOCARDIOGRAM LIMITED REPORT   Patient Name:   Roberto [name] Date of Exam: 05/05/2024 Medical Rec #:  [id number]       Height:       66.0 in Accession #:    [id number]      Weight:       196.8 lb Date of Birth:  1956-04-29       BSA:          1.986 m Patient Age:    68 years        BP:           112/78 mmHg Patient Gender: M               HR:           80 bpm. Exam Location:  [facility] Procedure: Limited Echo and Color Doppler (Both Spectral and Color Flow Doppler            were utilized  during procedure). Indications:     Congestive Heart Failure I50.9  History:         Patient has prior history of Echocardiogram examinations, most                  recent 09/27/2023. CHF.  Sonographer:     Ashley McNeely-Sloane Referring Phys:  [id number] [facility] Diagnosing Phys: Deatrice Cage MD IMPRESSIONS  1. Left ventricular ejection fraction, by estimation, is <20%. The left ventricle has severely decreased function. Left ventricular endocardial border not optimally defined to evaluate regional wall motion. There is moderate left ventricular hypertrophy.  2. Right ventricular systolic function is moderately reduced. The right ventricular size is mildly enlarged. Tricuspid regurgitation signal is inadequate for assessing PA pressure.  3. The mitral valve is normal in structure. Moderate mitral valve regurgitation. No evidence of mitral stenosis.  4. Tricuspid valve regurgitation is moderate to severe.  5. The aortic valve has been repaired/replaced. Aortic valve regurgitation is not visualized. Gradients were not measured.  6. The inferior vena cava is dilated in size with <50% respiratory variability, suggesting right atrial pressure of 15 mmHg. FINDINGS  Left Ventricle: Left ventricular ejection fraction, by estimation, is <20%. The left ventricle has severely decreased function. Left ventricular endocardial border not optimally defined to evaluate regional wall motion. The left ventricular internal cavity size was normal in size. There is moderate left ventricular hypertrophy. Right Ventricle: The right ventricular size is mildly enlarged. No increase in right ventricular wall thickness. Right ventricular systolic function is moderately reduced. Tricuspid regurgitation signal is inadequate for assessing PA pressure. Left Atrium: Left atrial size was normal in size. Right Atrium: Right atrial size was normal in size. Pericardium: There is no evidence of pericardial effusion. Mitral Valve: The mitral valve  is normal in structure. Moderate mitral valve regurgitation. No evidence of mitral valve stenosis. Tricuspid Valve: The  tricuspid valve is normal in structure. Tricuspid valve regurgitation is moderate to severe. No evidence of tricuspid stenosis. Aortic Valve: The aortic valve has been repaired/replaced. Aortic valve regurgitation is not visualized. Pulmonic Valve: The pulmonic valve was normal in structure. Pulmonic valve regurgitation is not visualized. No evidence of pulmonic stenosis. Aorta: The aortic root is normal in size and structure. Venous: The inferior vena cava is dilated in size with less than 50% respiratory variability, suggesting right atrial pressure of 15 mmHg. IAS/Shunts: No atrial level shunt detected by color flow Doppler. Additional Comments: A device lead is visualized. Color Doppler performed.  LEFT VENTRICLE PLAX 2D LVIDd:         5.80 cm LVIDs:         6.20 cm LV PW:         1.80 cm LV IVS:        1.10 cm  LV Volumes (MOD) LV vol d, MOD A2C: 150.0 ml LV vol d, MOD A4C: 188.0 ml LV vol s, MOD A2C: 122.0 ml LV vol s, MOD A4C: 145.0 ml LV SV MOD A2C:     28.0 ml LV SV MOD A4C:     188.0 ml LV SV MOD BP:      32.9 ml RIGHT VENTRICLE         IVC TAPSE (M-mode): 1.5 cm  IVC diam: 2.85 cm Deatrice Cage MD Electronically signed by Deatrice Cage MD Signature Date/Time: 05/05/2024/4:35:15 PM    Final     Cardiac Studies   Patient Profile   Jud Fanguy is a 68 y.o. male with a hx of mechanical aortic valve replacement, non-obstructive coronary artery disease, thoracic aortic aneurysm (followed by [facility]), stroke, right renal infarct, chronic systolic heart failure (LVEF 20% by cardiac MRI in 06/2023) complicated by VT s/p ICD and subsequent upgrade to CRT-D, persistent atrial fibrillation, hyperlipidemia, obstructive sleep apnea not using CPAP, and morbid obesity who is being seen 05/05/2024 for the evaluation of acute on chronic HFrEF   Assessment & Plan  Acute on chronic diastolic and  systolic CHF/nonischemic cardiomyopathy Echo detailing ejection fraction estimated less than 20% Presenting with altered mental status, shortness of breath - Management challenging secondary to hypotension, appears to be end-stage per primary cardiologist and admitting cardiology team - So far tolerating IV Lasix  twice daily, LFTs improving today, renal function stable, -1.8 L out past 24 hours - Primary cardiologist had discussion with patient yesterday concerning aggressive versus conservative therapy, palliative care was consulted for further discussion - He is likely poor candidate for LVAD or advanced therapies given dilated aorta - No strong indication to add inotropes at this time as he is responding to IV Lasix  - He is indicated his desire to go home and follow-up with Dr. Waddell, EP tomorrow for his appointment - High risk of readmission given end-stage cardiomyopathy - Would look to discharge on torsemide  40 daily spironolactone  25 daily midodrine  10 mg 3 times daily metoprolol  succinate 25 daily  Hypotension In the setting of low output heart failure, EF less than 25% -Will continue midodrine  10 mg 3 times daily  Ventricular tachycardia Managed by EP As outpatient was taking mexiletine  Thoracic aortic aneurysm Not a surgical candidate  Mechanical aortic valve replacement On Lovenox , labile INR on warfarin, currently subtherapeutic 1.3  Transaminitis Peak AST 531, down to 208 today -Cirrhosis on right upper quadrant ultrasound -exacerbated by hepatic congestion in the setting of acute systolic CHF    For questions or updates, please contact   [facility] Please consult [email or web address] for contact info under     Signed, Griffey Nicasio, MD  05/07/2024, 10:53 AM

## 2024-05-07 NOTE — Progress Notes (Signed)
 Rounding Note   Patient Name: Roberto [name] Date of Encounter: 05/07/2024  [facility] Cardiologist: [name], MD   Subjective Patient reports feeling well and is hopeful to be discharged today. He denies chest pain and shortness of breath. He has been diuresing well with good UOP. Weights inaccurate.   Scheduled Meds:  allopurinol   100 mg Oral Daily   budesonide-glycopyrrolate-formoterol   2 puff Inhalation BID   buPROPion   300 mg Oral Daily   enoxaparin  (LOVENOX ) injection  1 mg/kg Subcutaneous Q12H   furosemide   40 mg Intravenous BID   lidocaine   1 patch Transdermal Q24H   mexiletine  150 mg Oral TID   midodrine   10 mg Oral TID WC   pantoprazole   40 mg Oral BID   Continuous Infusions:  potassium chloride      PRN Meds: morphine injection, prochlorperazine **OR** prochlorperazine, traMADol   Vital Signs  Vitals:   05/07/24 0325 05/07/24 0439 05/07/24 0726 05/07/24 0823  BP: 94/77  98/80 96/77  Pulse: 80  84 78  Resp: 20  18 17   Temp: 97.8 F (36.6 C)  99 F (37.2 C) 98.2 F (36.8 C)  TempSrc:   Oral Oral  SpO2: 99%  99% 99%  Weight:  102.2 kg      Intake/Output Summary (Last 24 hours) at 05/07/2024 1043 Last data filed at 05/07/2024 0958 Gross per 24 hour  Intake 600 ml  Output 1050 ml  Net -450 ml      05/07/2024    4:39 AM 05/06/2024    5:00 AM 05/02/2024    7:17 PM  Last 3 Weights  Weight (lbs) 225 lb 5 oz 199 lb 1.2 oz 196 lb 12.8 oz  Weight (kg) 102.2 kg 90.3 kg 89.268 kg      Telemetry Predominately ventricular paced rhythm with occasional PVCs - Personally Reviewed  Physical Exam  GEN: No acute distress.   Neck: No JVD Cardiac: RRR, no murmurs, rubs, or gallops.  Respiratory: Clear to auscultation bilaterally. GI: Soft, nontender, non-distended  MS: No edema; No deformity. Neuro:  Nonfocal  Psych: Normal affect   Labs High Sensitivity Troponin:   Recent Labs  Lab 04/30/24 1905 04/30/24 2145 05/05/24 0114  05/05/24 0240  TROPONINIHS 26* 32* 44* 37*     Chemistry Recent Labs  Lab 05/01/24 0424 05/02/24 0348 05/05/24 0114 05/06/24 0324 05/07/24 0826  NA 140 139 136 142 137  K 2.8* 4.4 3.8 4.0 3.2*  CL 103 106 99 105 101  CO2 26 25 20* 28 24  GLUCOSE 89 96 125* 77 111*  BUN 24* 25* 25* 22 21  CREATININE 1.79* 1.73* 2.09* 1.67* 1.57*  CALCIUM  9.0 9.0 8.8* 9.0 8.6*  MG 2.0 2.3 1.8  --   --   PROT  --   --  7.2 6.2* 6.8  ALBUMIN  --  3.2* 3.2* 2.9* 3.0*  AST  --   --  209* 531* 208*  ALT  --   --  119* 301* 257*  ALKPHOS  --   --  63 60 68  BILITOT  --   --  1.2 1.0 1.0  GFRNONAA 41* 42* 34* 44* 47*  ANIONGAP 11 8 17* 9 12    Lipids No results for input(s): CHOL, TRIG, HDL, LABVLDL, LDLCALC, CHOLHDL in the last 168 hours.  Hematology Recent Labs  Lab 05/01/24 0424 05/05/24 0114 05/06/24 0324  WBC 9.7 7.2 5.4  RBC 3.91* 4.52 3.97*  HGB 11.2* 13.1 11.3*  HCT 35.0*  40.7 36.1*  MCV 89.5 90.0 90.9  MCH 28.6 29.0 28.5  MCHC 32.0 32.2 31.3  RDW 16.2* 17.2* 17.3*  PLT 209 258 217   Thyroid   Recent Labs  Lab 05/05/24 0114  TSH 7.753*  FREET4 1.30*    BNP Recent Labs  Lab 05/05/24 0114  BNP 470.9*    DDimer No results for input(s): DDIMER in the last 168 hours.   Cardiac Studies  05/05/2024 Echo limited 1. Left ventricular ejection fraction, by estimation, is <20%. The left  ventricle has severely decreased function. Left ventricular endocardial  border not optimally defined to evaluate regional wall motion. There is  moderate left ventricular  hypertrophy.   2. Right ventricular systolic function is moderately reduced. The right  ventricular size is mildly enlarged. Tricuspid regurgitation signal is  inadequate for assessing PA pressure.   3. The mitral valve is normal in structure. Moderate mitral valve  regurgitation. No evidence of mitral stenosis.   4. Tricuspid valve regurgitation is moderate to severe.   5. The aortic valve has been  repaired/replaced. Aortic valve  regurgitation is not visualized. Gradients were not measured.   6. The inferior vena cava is dilated in size with <50% respiratory  variability, suggesting right atrial pressure of 15 mmHg.   Patient Profile   68 y.o. male with a hx of mechanical aortic valve replacement, non-obstructive coronary artery disease, thoracic aortic aneurysm (followed by [facility]), stroke, right renal infarct, chronic systolic heart failure (LVEF 20% by cardiac MRI in 06/2023) complicated by VT s/p ICD and subsequent upgrade to CRT-D, persistent atrial fibrillation, hyperlipidemia, obstructive sleep apnea not using CPAP, and morbid obesity who is being seen for the ongoing evaluation and management of acute on chronic HFrEF.   Assessment & Plan   Acute on chronic HFrEF - Presenting with AMS and worsening dyspnea - Limited echo showed worsening LV systolic function with EF < 20%, moderately reduced RVSF, moderate MR, moderate to severe TR, and RA pressure 15 mmHg - He is diuresing well on IV Lasix  40 mg twice daily which we will continue today - I/os show good UOP.  Weights inaccurate. - Long discussion with primary cardiologist Dr. Mady yesterday regarding his severe biventricular failure and limited treatment options.  Age and comorbidities preclude him from receiving cardiac transplant and his thoracic aortic aneurysm/RV failure potentially limit his ability to receive an LVAD.  Additionally, medical therapy is severely limited by hypotension. - Recommendation for either discharge with close follow-up (or inpatient transfer) to the advanced heart failure team at [facility] versus transitioning to hospice - Patient's preference is to pursue a conservative approach so that he can spend as much time at home as possible.  However, his significant other prefers that he pursue all therapeutic options before transitioning to a palliative approach. - Palliative consultation is  pending  Hypotension - BP remains soft but stable - Continue midodrine  10 mg 3 times daily  Ventricular tachycardia - No VT noted this admission - Continue mexiletine as prescribed by patient's EP physician at [facility] - Amiodarone  currently held due to elevated LFTs.  Will plan to resume this as transaminitis resolves.  Thoracic aortic aneurysm - No symptoms to suggest dissection/rupture.  Ongoing surveillance with cardiac surgery.  Hx mechanical aortic valve replacement - Continue therapeutic enoxaparin  in the setting of labile INR on warfarin.  Management per pharmacy.  Transaminitis - Likely due to congestive hepatopathy and possible hypoperfusion from decompensated heart failure - Amiodarone , fenofibrate , and rosuvastatin  held -  Overall improving with diuresis   For questions or updates, please contact [facility] Please consult [email or web address] for contact info under       Signed, Lesley LITTIE Maffucci, [name]  05/07/2024, 10:43 AM

## 2024-05-07 NOTE — Progress Notes (Signed)
 PROGRESS NOTE    Elija Meador  FMW:[id number] DOB: 14-Jul-1955 DOA: 05/05/2024 PCP: Vicci Bouchard P, DO  [id number]  LOS: 0 days   Brief hospital course:   Assessment & Plan: 68 y.o. male with medical history significant of essential hypertension, dyslipidemia, bicuspid aortic valve status post mechanical valve replacement on Lovenox ,, CVA/TIA, CKD stage IIIb, chronic HFrEF, s/p ICD presenting with encephalopathy, acute on chronic HFrEF, acute respiratory failure with hypoxia.  History primarily from patient's wife in the setting of encephalopathy.  Per report, patient noted to have been recently admitted October 29 through October 31 for issues including acute metabolic encephalopathy, acute on chronic hypotension.  Per the wife, patient's had multiple falls from discharge as well as within the past 24 hours.  No reports of focal hemiparesis.  Has had generalized weakness as well as confusion and has seemed to be somewhat chronic.  Noted prior history of TIA.  There was some concern for dementia in the outpatient setting.  However, wife reports that that was not fully worked up because of other pressing issues by PCP.    Encephalopathy Patient was reportedly having generalized confusion on presentation.  Questionable how much of this is acute versus chronic.  Documented similar presentation on most recent admission.  Unclear etiology.  Hypotension and hypoxia may be confounding issues.  No formal diagnosis of dementia.  CT head with some global atrophy.  Ammonia within normal limits. Plan: Patient is alert and oriented x 3 on my evaluation.  Can resume home trazodone  and tramadol.  Hold Pamelor  for now.   Acute respiratory failure with hypoxia (HCC) Acute on chronic HFrEF  2D ECHO 09/2023 with EF 20-25% and grade 2 diastolic dysfunction s/p pacemaker; s/p ARV with mild to moderate aortic stenosis on u/s  Decompensated respiratory status now requiring 2 L nasal cannula with O2 sats around  80% on room air on presentation BNP 471 with noted volume overload and cardiomegaly on chest imaging. --Case discussed with cardiology.  Patient's prognosis is poor.  Patient's cardiologist is Dr. Mady.  Dr. Mady spoken to the patient and his wife extensively.  Patient wants to go home however he is very likely to bounce back within a few days.   --palliative care consult --cont IV lasix  40   Hypotension, chronic --cont midodrine    Prolonged QT interval QTc in 640s on presentation  Correct electroltyes  Hold offendig medications  Follow-up cardiac recommendations   COPD (chronic obstructive pulmonary disease) (HCC) Does not appear to be in active COPD exacerbation  --cont bronchodilators   H/O mechanical aortic valve replacement Atrial Fibrillation Ventricular paced rhythm Patient with BiV ICD in place --cont warfarin and lovenox    Transaminitis AST 209, ALT 119. T bili 1.2.  No active abd pain  RUQ u/s WNL  ETOH and tylenol  level WNL  Monitor   Elevated troponin Trop 30s-40s on presentation  No active CP  Looks to be baseline    Pre-diabetes  Hemoglobin A1c 5.9.  Nondiabetic range.   Essential hypertension, not currently active   Chronic kidney disease, stage 3a (HCC) --monitor Cr with diuresis     Hx of Nicotine  dependence, cigarettes, uncomplicated Patient/ wife deny any active tobacco use at present  Monitor    DVT prophylaxis: Lovenox  SQ Code Status: Full code  Family Communication:  Level of care: Progressive Dispo:   The patient is from: home Anticipated d/c is to: home Anticipated d/c date is: 1-2 days   Subjective and Interval History:  Pt reported  feeling good, no dyspnea, good urine output.  Pt said he knew he was confused PTA, but not anymore.     Objective: Vitals:   05/07/24 1127 05/07/24 1221 05/07/24 1620 05/07/24 1807  BP: 107/73  92/65 93/71  Pulse: 82  84 80  Resp: 17  18 18   Temp: 98.2 F (36.8 C)  98.2 F (36.8 C)   TempSrc:  Oral  Oral   SpO2: 100% 94% 100% 91%  Weight:        Intake/Output Summary (Last 24 hours) at 05/07/2024 2129 Last data filed at 05/07/2024 1911 Gross per 24 hour  Intake 2160 ml  Output 1375 ml  Net 785 ml   Filed Weights   05/06/24 0500 05/07/24 0439  Weight: 90.3 kg 102.2 kg    Examination:   Constitutional: NAD, alert, oriented HEENT: conjunctivae and lids normal, EOMI CV: No cyanosis.   RESP: normal respiratory effort, on RA Neuro: II - XII grossly intact.   Psych: Normal mood and affect.     Data Reviewed: I have personally reviewed labs and imaging studies  Time spent: 50 minutes  Ellouise Haber, MD [facility] If 7PM-7AM, please contact night-coverage 05/07/2024, 9:29 PM

## 2024-05-07 NOTE — Consult Note (Signed)
 PHARMACY - ANTICOAGULATION CONSULT NOTE  Pharmacy Consult for Warfarin with Enoxaparin  Bridge Indication: history of mechanical aortic valve replacement  Allergies  Allergen Reactions   Latex Hives   Nicotine  Hives and Rash    Patches caused localized rash and hives     Patient Measurements: Weight: 102.2 kg (225 lb 5 oz) (only blanket and pillow on bed)  Vital Signs: Temp: 98.2 F (36.8 C) (11/05 1127) Temp Source: Oral (11/05 1127) BP: 107/73 (11/05 1127) Pulse Rate: 82 (11/05 1127)  Labs: Recent Labs    05/05/24 0114 05/05/24 0129 05/05/24 0240 05/05/24 1100 05/06/24 0324 05/06/24 0919 05/07/24 0502 05/07/24 0826  HGB 13.1  --   --   --  11.3*  --   --   --   HCT 40.7  --   --   --  36.1*  --   --   --   PLT 258  --   --   --  217  --   --   --   LABPROT  --   --   --  18.6*  --  17.5* 17.1*  --   INR  --   --   --  1.5*  --  1.4* 1.3*  --   CREATININE 2.09*  --   --   --  1.67*  --   --  1.57*  CKTOTAL  --  241  --   --   --   --   --   --   TROPONINIHS 44*  --  37*  --   --   --   --   --     Estimated Creatinine Clearance: 49.7 mL/min (A) (by C-G formula based on SCr of 1.57 mg/dL (H)).   Medical History: Past Medical History:  Diagnosis Date   Aneurysm    Bicuspid aortic valve    a. s/p #27 [employer] mechanical valve on 03/25/2010; b. on Coumadin ; c. TTE 12/17: EF 40-45%, moderately dilated LV with moderate LVH, AVR well-seated with 14 mmHg gradient, peak AV velocity 2.5 m/s, mild mitral valve thickening with mild MR, mildly dilated RV with mildly reduced contraction   Cellulitis    CHF (congestive heart failure) (HCC)    Chronic kidney disease    Chronic systolic CHF (congestive heart failure) (HCC)    a. R/LHC 03/2010 showed no significant CAD, LVEDP 31 mmHg, mean AoV gradient 34 mmHg at rest and 47 mmHg with dobutamine 20 mcg/kg/min, AVA 1.0 cm^2, RA 31, RV 68/25, PA 68/47, PCWP 38. PA sat 65%. CO 6.2 L/min (Fick) and 5.3 L/min (thermodilution)    Clotting disorder    COPD (chronic obstructive pulmonary disease) (HCC)    H/O mechanical aortic valve replacement 03/25/2010   a. 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   Stage 3 chronic kidney disease (HCC)    Stroke (HCC)    TIA (transient ischemic attack) 05/2014    Medications:  PTA: Enoxaparin  72mg  Q12 hours    Assessment: 68 yo male with PMH listed above who presented to the ED with encephalopathy, acute on chronic HFrEF, acute respiratory failure with hypoxia. Patient with history of mechanical aortic valve replacement and was recently switched from warfarin to treatment dose enoxaparin  in the setting of labile INRs. Per patient, was on continuing cycle of taking warfarin 4mg ,  4mg  7.5mg . Seen by cardiology team, recommending to resume warfarin.   Date INR Dose  11/5 1.3                 Goal of Therapy:  INR 2.5-3.5 Monitor platelets by anticoagulation protocol: Yes   Plan:  - Warfarin 7.5mg  x 1 - INR/CBC daily - enoxaparin  90mg  Q12 hours - will continue to bridge with therapeutic enoxaparin  until INR is WNL  Rehman Levinson A Mahlik Lenn 05/07/2024,1:47 PM

## 2024-05-07 NOTE — Progress Notes (Signed)
 CCMD called patient runs 10 beats of vtach, on assessment with the patient sleeping, bp Bp 90/72 map 79, asymptomatic. Dr. Madison Peaches made aware no new orders.

## 2024-05-07 NOTE — Plan of Care (Signed)
  Problem: Education: Goal: Ability to describe self-care measures that may prevent or decrease complications (Diabetes Survival Skills Education) will improve Outcome: Progressing   Problem: Coping: Goal: Ability to adjust to condition or change in health will improve Outcome: Progressing   Problem: Fluid Volume: Goal: Ability to maintain a balanced intake and output will improve Outcome: Progressing   Problem: Clinical Measurements: Goal: Diagnostic test results will improve Outcome: Progressing Goal: Respiratory complications will improve Outcome: Progressing   Problem: Activity: Goal: Risk for activity intolerance will decrease Outcome: Progressing   Problem: Elimination: Goal: Will not experience complications related to urinary retention Outcome: Progressing   Problem: Pain Managment: Goal: General experience of comfort will improve and/or be controlled Outcome: Progressing

## 2024-05-08 ENCOUNTER — Ambulatory Visit: Admitting: Internal Medicine

## 2024-05-08 ENCOUNTER — Encounter: Payer: Self-pay | Admitting: Family Medicine

## 2024-05-08 DIAGNOSIS — E872 Acidosis, unspecified: Secondary | ICD-10-CM | POA: Diagnosis not present

## 2024-05-08 DIAGNOSIS — J432 Centrilobular emphysema: Secondary | ICD-10-CM

## 2024-05-08 DIAGNOSIS — E1122 Type 2 diabetes mellitus with diabetic chronic kidney disease: Secondary | ICD-10-CM | POA: Diagnosis not present

## 2024-05-08 DIAGNOSIS — J810 Acute pulmonary edema: Secondary | ICD-10-CM | POA: Diagnosis not present

## 2024-05-08 DIAGNOSIS — R4182 Altered mental status, unspecified: Secondary | ICD-10-CM | POA: Diagnosis not present

## 2024-05-08 DIAGNOSIS — I472 Ventricular tachycardia, unspecified: Secondary | ICD-10-CM | POA: Diagnosis not present

## 2024-05-08 LAB — COMPREHENSIVE METABOLIC PANEL WITH GFR
ALT: 196 U/L — ABNORMAL HIGH (ref 0–44)
AST: 107 U/L — ABNORMAL HIGH (ref 15–41)
Albumin: 3.1 g/dL — ABNORMAL LOW (ref 3.5–5.0)
Alkaline Phosphatase: 74 U/L (ref 38–126)
Anion gap: 14 (ref 5–15)
BUN: 23 mg/dL (ref 8–23)
CO2: 26 mmol/L (ref 22–32)
Calcium: 8.9 mg/dL (ref 8.9–10.3)
Chloride: 100 mmol/L (ref 98–111)
Creatinine, Ser: 1.61 mg/dL — ABNORMAL HIGH (ref 0.61–1.24)
GFR, Estimated: 46 mL/min — ABNORMAL LOW (ref >60)
Glucose, Bld: 116 mg/dL — ABNORMAL HIGH (ref 70–99)
Potassium: 3.4 mmol/L — ABNORMAL LOW (ref 3.5–5.1)
Sodium: 140 mmol/L (ref 135–145)
Total Bilirubin: 0.9 mg/dL (ref 0.0–1.2)
Total Protein: 6.9 g/dL (ref 6.5–8.1)

## 2024-05-08 LAB — CBC
HCT: 38.6 % — ABNORMAL LOW (ref 39.0–52.0)
Hemoglobin: 12.1 g/dL — ABNORMAL LOW (ref 13.0–17.0)
MCH: 28.6 pg (ref 26.0–34.0)
MCHC: 31.3 g/dL (ref 30.0–36.0)
MCV: 91.3 fL (ref 80.0–100.0)
Platelets: 274 K/uL (ref 150–400)
RBC: 4.23 MIL/uL (ref 4.22–5.81)
RDW: 17.4 % — ABNORMAL HIGH (ref 11.5–15.5)
WBC: 7.3 K/uL (ref 4.0–10.5)
nRBC: 0 % (ref 0.0–0.2)

## 2024-05-08 LAB — MAGNESIUM: Magnesium: 2 mg/dL (ref 1.7–2.4)

## 2024-05-08 LAB — PROTIME-INR
INR: 1.3 — ABNORMAL HIGH (ref 0.8–1.2)
Prothrombin Time: 16.7 s — ABNORMAL HIGH (ref 11.4–15.2)

## 2024-05-08 MED ORDER — ENSURE PLUS HIGH PROTEIN PO LIQD
237.0000 mL | Freq: Two times a day (BID) | ORAL | Status: DC
  Administered 2024-05-08 – 2024-05-09 (×3): 237 mL via ORAL

## 2024-05-08 MED ORDER — SPIRONOLACTONE 12.5 MG HALF TABLET
12.5000 mg | ORAL_TABLET | Freq: Every day | ORAL | Status: DC
  Administered 2024-05-08 – 2024-05-09 (×2): 12.5 mg via ORAL
  Filled 2024-05-08 (×2): qty 1

## 2024-05-08 MED ORDER — WARFARIN SODIUM 7.5 MG PO TABS
7.5000 mg | ORAL_TABLET | Freq: Once | ORAL | Status: AC
  Administered 2024-05-08: 7.5 mg via ORAL
  Filled 2024-05-08: qty 1

## 2024-05-08 MED ORDER — POTASSIUM CHLORIDE CRYS ER 20 MEQ PO TBCR
40.0000 meq | EXTENDED_RELEASE_TABLET | Freq: Once | ORAL | Status: AC
  Administered 2024-05-08: 40 meq via ORAL
  Filled 2024-05-08: qty 2

## 2024-05-08 MED ORDER — DAPAGLIFLOZIN PROPANEDIOL 10 MG PO TABS
10.0000 mg | ORAL_TABLET | Freq: Every day | ORAL | Status: DC
  Administered 2024-05-08 – 2024-05-09 (×2): 10 mg via ORAL
  Filled 2024-05-08 (×2): qty 1

## 2024-05-08 MED ORDER — METOPROLOL SUCCINATE ER 25 MG PO TB24
12.5000 mg | ORAL_TABLET | Freq: Every day | ORAL | Status: DC
  Administered 2024-05-08 – 2024-05-09 (×2): 12.5 mg via ORAL
  Filled 2024-05-08 (×2): qty 1

## 2024-05-08 MED ORDER — ROSUVASTATIN CALCIUM 20 MG PO TABS
40.0000 mg | ORAL_TABLET | Freq: Every evening | ORAL | Status: DC
  Administered 2024-05-08: 40 mg via ORAL
  Filled 2024-05-08: qty 2
  Filled 2024-05-08: qty 4
  Filled 2024-05-08: qty 2

## 2024-05-08 NOTE — Progress Notes (Signed)
       CROSS COVER NOTE  NAME: Roberto [name] MRN: [id number] DOB : 1956-02-09    Concern as stated by nurse / staff   This patient has history of NSVT yesterday and tonight runs multiple episodes of NSVT, 5 beats, 14 beats now which is the longest. Asymptomatic. Vital signs within normal range. Patient has ICD, He came here due to AMS and acute pulmonary edema. His potassium was 3.2 yest and was given K infusion 10meq. Just to let you know      Pertinent findings on chart review:   Patient Assessment   Assessment and  Interventions   Assessment:  NSVT, asymptomatic AICD status  Plan: Check K and mag and replete if needed X X

## 2024-05-08 NOTE — TOC Progression Note (Signed)
 Transition of Care [facility]) - Progression Note    Patient Details  Name: Roberto [name] MRN: [id number] Date of Birth: [birth year]  [facility]) CM/SW Contact  Lauraine JAYSON Carpen, LCSW Phone Number: 05/08/2024, 12:45 PM  Clinical Narrative:  Patient is agreeable to outpatient PT. He and friend prefer [facility]. CSW faxed referral. If patient needs home oxygen, they prefer [facility].    Expected Discharge Plan and Services                                               Social Drivers of Health (SDOH) Interventions SDOH Screenings   Food Insecurity: Food Insecurity Present (05/05/2024)  Housing: Low Risk  (05/05/2024)  Transportation Needs: No Transportation Needs (05/05/2024)  Recent Concern: Transportation Needs - Unmet Transportation Needs (05/01/2024)  Utilities: Not At Risk (05/05/2024)  Alcohol Screen: Low Risk  (09/04/2023)  Depression (PHQ2-9): High Risk (03/28/2024)  Financial Resource Strain: High Risk (04/21/2024)  Physical Activity: Inactive (04/21/2024)  Social Connections: Socially Isolated (05/05/2024)  Stress: Stress Concern Present (04/21/2024)  Tobacco Use: High Risk (05/02/2024)  Health Literacy: Adequate Health Literacy (09/04/2023)    Readmission Risk Interventions    08/29/2023    3:19 PM 06/18/2023   10:33 AM  Readmission Risk Prevention Plan  Transportation Screening Complete Complete  PCP or Specialist Appt within 3-5 Days  Complete  HRI or Home Care Consult Complete Complete  Social Work Consult for Recovery Care Planning/Counseling Complete Patient refused  Palliative Care Screening Not Applicable Not Applicable  Medication Review Oceanographer) Referral to Pharmacy Complete

## 2024-05-08 NOTE — Discharge Instructions (Addendum)
 Home Ramps  [place] Division of Vocational Rehabilitation Services - Independent Living Services For permanent ramp Call [place] office: [phone] Will have to obtain documentation to confirm medical and financial eligibility. Not a quick process. Can take up to a year to complete.  Access and Mobility Rentals For temporary ramp Call [phone]   Food Resources  Agency Name: [facility] Agency Address: [address] [place], [place] [address] Phone: [phone] Website: [email or web address] Service(s) Offered: Housing services, self-sufficiency, congregate meal program, weatherization program, event organiser program, emergency food assistance,  housing counseling, home ownership program, wheels - to work program.  [employer] free for 60 and older at various locations from usaa, Monday-Friday:  [employer], [address] [place], [phone] -[facility], [address] [place] [phone]  -[facility], [address], [place] [phone]  -[address], [place], [phone]  Agency Name: [facility] Address: [phone], [place], [place] [address] Phone: [phone] Website: www.[email or web address] Service(s) Offered: Home delivered hot, frozen, and emergency  meals. Grocery assistance program which matches  volunteers one-on-one with seniors unable to grocery shop  for themselves. Must be 60 years and older; less than 20  hours of in-home aide service, limited or no driving ability;  live alone or with someone with a disability; live in  [place].  Agency Name: [facility] [employer]) Address: [address] [place], [place] [address] Phone: [phone] Service(s) Offered: Food is served to shut-ins, homeless, elderly, and low income people in the community every Saturday (11:30 am-12:30 pm) and Sunday  (12:30 pm-1:30pm). Volunteers also offer help and encouragement in seeking employment,  and spiritual guidance.  Agency Name: Department [employer] Services Address: [address] [place], [place] [address] Phone: [phone] Service(s) Offered: Child support services; child welfare services; food stamps; Medicaid; work first family assistance; and aid with fuel,  rent, food and medicine.  Agency Name: Dietitian Address: [address], [place], [place] Phone: [phone] Website: [email or web address] Services Offered: Monday 10:00am-12:00, 8:00pm-9:00pm, and Friday 10:00am-12:00.  Agency Name: [employer] Address: [address], [place], [place] [address] Phone: [phone] Website: [email or web address] Service(s) Offered: Serves weekday meals, open from 11:30 am- 1:00 pm., and 6:30-7:30pm, Monday-Wednesday-Friday distributes food 3:30-6pm, Monday-Wednesday-Friday.  Agency Name: [facility] Address: [address] [place], [place] Phone: [phone] Website: [email or web address] Services Offered: Distributes food the 4th Saturday of the month, starting at 8:00 am  Agency Name: [facility] Address: [id number], [place], [place] [address] Phone: [phone] Website: [email or web address] Service(s) Offered: Bread of life, weekly food pantry. Open Wednesdays from 10:00am-noon.  Agency Name: [facility] [employer] Address: [address] [place], [place], [place] Phone: [phone] Services Offered: Distributes food 9am-1pm, Monday-Thursday. Call for details.  Agency Name: [facility] Address: [address], [place], [place] [address] Phone: [phone] Website: [email or web address] Service(s) Offered: Games Developer. Call for assistance.  Agency Name: [facility] Address: [address] [place], [place]  [address] Phone: [phone] Service Offered: Emergency Food Pantry. Call for appointment.  Agency Name: [facility] Address: [address] [place], [place] [address] Phone: [phone] Website: [email or web address] Services Offered: Games Developer. Call for details  Agency Name: [facility] Address: [address] [place], [place] Phone: [phone] Website: [email or web address] Service(s) Offered: Emergency Food Pantry. Call for details.  Agency Name: Holiday Representative Address: [address]  [place], [place] [address] Phone: [phone] or [phone] Website: [email or web address] Service(s) Offered: Distribute food 9am-11:30 am, Tuesday-Friday, and 1-3:30pm, Monday-Friday. Food pantry Monday-Friday 1pm-3pm, fresh items, Mon.-Wed.-Fri.  Agency Name: [facility] Address: [address] [place], [place] [address] Phone: [phone] Website: [email or web address] Services Offered: Distribute food Tues and Sats from 9:00am-noon. Closed 1st Saturday of each month. Call for details  Agency Name: [employer] Address: Fayrene [address]man [facility] [address] [place] [address] Phone: [phone]  Services Offered: Delivers meals every Thursday   Do you feel isolated?  [facility] [employer] [facility] offers a [employer] that anyone can call toll free at [phone]. The friendship line is available 24 hours a day  [employer] is a Program of All-inclusive Care for the Elderly (PACE). Their mission is to promote and sustain the independence of seniors wishing to remain in the community. They provide seniors with comprehensive long-term health, social, medical and dietary care. Their program is a safe alternative to nursing home care. [phone]  [facility] Physical Address [facility] [address] [place], [place] [address] Phone: [phone]. . Online zoom yoga  class, connect with others without leaving your home [facility] offers [place] dance cardio sessions for individuals via Zoom. This program provides: - Dance fitness activities Please contact program for more information. Servinganyone in need adults 18+ hiv/aids individuals families Call [phone]  Email [email or web address] to get more info  [employer] offers an online [employer] to individuals where they can receive help to focus on their best health. Whether you're a [employer] member or not, the neighborhood center offers a... Main Serviceshealth education  exercise & fitness  community support services  recreation  virtual support Other Servicessupport groups Servinganyone in need adults young adults teens seniors individuals families [email or web address] to get more info  Schedule on their website  [facility] offers an array of activities for adults age 13 and over. This program provides:- Fitness and health programs- Tech classes- Activity books Main Serviceshealth education  community support services  exercise & fitness  recreation  more education Servingseniors  Call [phone]    For more resources go online to [email or web address] and type in you zipcode

## 2024-05-08 NOTE — Plan of Care (Signed)
  Problem: Nutritional: Goal: Maintenance of adequate nutrition will improve Outcome: Progressing   Problem: Clinical Measurements: Goal: Ability to maintain clinical measurements within normal limits will improve Outcome: Progressing   Problem: Clinical Measurements: Goal: Will remain free from infection Outcome: Progressing

## 2024-05-08 NOTE — Progress Notes (Signed)
  Progress Note  Patient Name: Roberto [name] Date of Encounter: 05/08/2024 [facility] Cardiologist: [name], MD   Interval Summary    Patient is wanting to go home. He is scheduled today to get married at the court house. He is on 1L O2. He denies chest pain or SOB.   Vital Signs Vitals:   05/07/24 2337 05/08/24 0346 05/08/24 0400 05/08/24 0437  BP: 107/79 (!) 109/90    Pulse: 86 (!) 48 98   Resp: 18 18    Temp: 98.4 F (36.9 C) 98.3 F (36.8 C)    TempSrc:      SpO2: 95% 90%    Weight:    100.5 kg    Intake/Output Summary (Last 24 hours) at 05/08/2024 0731 Last data filed at 05/08/2024 9367 Gross per 24 hour  Intake 2400 ml  Output 1325 ml  Net 1075 ml      05/08/2024    4:37 AM 05/07/2024    4:39 AM 05/06/2024    5:00 AM  Last 3 Weights  Weight (lbs) 221 lb 9 oz 225 lb 5 oz 199 lb 1.2 oz  Weight (kg) 100.5 kg 102.2 kg 90.3 kg      Telemetry/ECG  NSR, intermittent AV pacing, PVC, HR 80-100 - Personally Reviewed  Physical Exam  GEN: No acute distress.   Neck: No JVD Cardiac: RRR, no murmurs, rubs, or gallops.  Respiratory: Clear to auscultation bilaterally. GI: Soft, nontender, non-distended  MS: No edema  Assessment & Plan   Acute on chronic diastolic and systolic CHF/NICM - presented with AMS and SOB - echo this admission showed LVEF<20% - prior ECHo 10/2023 showed LVEF 25-30%, G2DD - management has been difficult given hypotension - IV lasix  40mg  BID - he is end stage heart failure. he is a poor candidate for LVAD or advanced therapies given dilated aorta. Primary cardiologist recommended palliative care consult - midodrine  10mg  TID - he is euvolemic. Switch lasix  to Torsemide  40mg  daily - unsure BP will tolerate spiro 25mg  daily, metoprolol  25mg  daily>can add as OP  Hypotension - in the setting of low output heart failure withEF<20% - midodrine  10mg  TID - BP 114/70 this AM  VT - managed by EP - continue mexiletine  Thoracic  aortic aneurysm - not a surgical candidate  Mechanical aortic valve replacement - warfarin per pharmacy  Transaminitis - trending down    For questions or updates, please contact [facility] Please consult [email or web address] for contact info under         Signed, [name], [name]

## 2024-05-08 NOTE — Progress Notes (Addendum)
 CCMD called, patient runs 14 beats of VT NS, upon assessment, patient is asymptomatic. Vital signs within normal range. Dr VEAR Solian made aware, stat labs ordered.

## 2024-05-08 NOTE — Plan of Care (Signed)

## 2024-05-08 NOTE — TOC CM/SW Note (Signed)
     [facility] REFERRAL        Occupational Therapy * Physical Therapy * Speech Therapy                           DATE 05/08/2024  PATIENT NAME Roberto [name] PATIENT MRN [id number]       DIAGNOSIS/DIAGNOSIS CODE J81.0, Z95.2, J44.9, I47.20, I95.9, I50.23, J96.01, R79.89, R74.01, R94.31.  DATE OF DISCHARGE: 05/09/2024       PRIMARY CARE PHYSICIAN   Duwaine Louder, DO  PCP PHONE/FAX Phone: [phone]     Dear Provider (Name: Dareen Fax: [phone])   I certify that I have examined this patient and that occupational/physical/speech therapy is necessary on an outpatient basis.    The patient has expressed interest in completing their recommended course of therapy at your  location.  Once a formal order from the patient's primary care physician has been obtained, please  contact him/her to schedule an appointment for evaluation at your earliest convenience.   [ x]  Physical Therapy Evaluate and Treat  [  ]  Occupational Therapy Evaluate and Treat  [  ]  Speech Therapy Evaluate and Treat         The patient's primary care physician (listed above) must furnish and be responsible for a formal order such that the recommended services may be furnished while under the primary physician's care, and that the plan of care will be established and reviewed every 30 days (or more often if condition necessitates).

## 2024-05-08 NOTE — Progress Notes (Signed)
 Mobility Specialist Progress Note:    05/08/24 1127  Mobility  Activity Ambulated with assistance  Level of Assistance Standby assist, set-up cues, supervision of patient - no hands on  Assistive Device Front wheel walker  Distance Ambulated (ft) 150 ft  Range of Motion/Exercises Active;All extremities  Activity Response Tolerated well  Mobility visit 1 Mobility  Mobility Specialist Start Time (ACUTE ONLY) 1112  Mobility Specialist Stop Time (ACUTE ONLY) 1127  Mobility Specialist Time Calculation (min) (ACUTE ONLY) 15 min   Pt received ambulating with significant other, agreeable to further mobility. Required supervision to ambulate with RW. Tolerated well, unable to get true pleth for O2 and HR during session. Pt denies SOB and all symptoms. Returned to room, left in chair. All needs met.  Sherrilee Ditty Mobility Specialist Please contact via Special Educational Needs Teacher or  Rehab office at [phone]

## 2024-05-08 NOTE — Progress Notes (Signed)
 HTN, DM, non-ischemic cardiomyopathy, thoracic aortic aneyrusym without ruputure, aortic atherosclerosis, history of renal infarct, chronic heart failure with reduced ejection fraction, vavular heart disease, coronary artery disease, atrial firbillation, history of ventricular tachycardia, COPD, esophageal dysmotility, hyperlipidemia, diabetic peripheral neuropathy, osteoarthritis of the lumbar spine, essential tremor, Chronic Kidney disease, morbid obesity, chronic gout, history of aortic stenosis, depression, PTSD, hyperlipidemia

## 2024-05-08 NOTE — Telephone Encounter (Signed)
 Spoke with Roberto [name]. She is aware this is currently being worked on and we will notify as soon as it is available.

## 2024-05-08 NOTE — Progress Notes (Signed)
 PROGRESS NOTE    Roberto [name]  FMW:[id number] DOB: [birth year] DOA: 05/05/2024 PCP: Vicci Bouchard P, DO  [id number]  LOS: 1 day   Brief hospital course:   Assessment & Plan: 68 y.o. male with medical history significant of essential hypertension, dyslipidemia, bicuspid aortic valve status post mechanical valve replacement on Lovenox ,, CVA/TIA, CKD stage IIIb, chronic HFrEF, s/p ICD presenting with encephalopathy, acute on chronic HFrEF, acute respiratory failure with hypoxia.  History primarily from patient's wife in the setting of encephalopathy.  Per report, patient noted to have been recently admitted October 29 through October 31 for issues including acute metabolic encephalopathy, acute on chronic hypotension.  Per the wife, patient's had multiple falls from discharge as well as within the past 24 hours.  No reports of focal hemiparesis.  Has had generalized weakness as well as confusion and has seemed to be somewhat chronic.  Noted prior history of TIA.  There was some concern for dementia in the outpatient setting.  However, wife reports that that was not fully worked up because of other pressing issues by PCP.    Acute metabolic Encephalopathy Patient was reportedly having generalized confusion on presentation.  Questionable how much of this is acute versus chronic.  Documented similar presentation on most recent admission.  Unclear etiology.  Hypotension and hypoxia may be confounding issues.  No formal diagnosis of dementia.  CT head with some global atrophy.  Ammonia within normal limits. --mental status back to baseline now.   Acute respiratory failure with hypoxia (HCC) Acute on chronic HFrEF  2D ECHO 09/2023 with EF 20-25% and grade 2 diastolic dysfunction s/p pacemaker; s/p ARV with mild to moderate aortic stenosis on u/s  Decompensated respiratory status now requiring 2 L nasal cannula with O2 sats around 80% on room air on presentation BNP 471 with noted volume overload and  cardiomegaly on chest imaging. --Case discussed with cardiology.  Patient's prognosis is poor.  Patient's cardiologist is Dr. Mady.  Dr. Mady spoken to the patient and his wife extensively.  Patient wants to go home however he is very likely to bounce back within a few days.   --palliative care consulted --cont IV lasix  40 BID --resume Toprol  and Aldactone   Hypokalemia --from diuresis --monitor and supplement PRN   Hypotension, chronic --cont midodrine  10 mg TID   Prolonged QT interval QTc in 640s on presentation  Correct electroltyes  Hold offendig medications  Follow-up cardiac recommendations   COPD (chronic obstructive pulmonary disease) (HCC) Does not appear to be in active COPD exacerbation  --cont bronchodilators   H/O mechanical aortic valve replacement Atrial Fibrillation Ventricular paced rhythm Patient with BiV ICD in place --cont warfarin and lovenox    Transaminitis AST 209, ALT 119. T bili 1.2.  No active abd pain  RUQ u/s WNL  ETOH and tylenol  level WNL  Monitor   Elevated troponin Trop 30s-40s on presentation  No active CP  Looks to be baseline    Pre-diabetes  Hemoglobin A1c 5.9.     Essential hypertension, not currently active   Chronic kidney disease, stage 3a (HCC) --monitor Cr with diuresis   Hx of Nicotine  dependence, cigarettes, uncomplicated Patient/ wife deny any active tobacco use at present    DVT prophylaxis: Lovenox  SQ Code Status: Full code  Family Communication: significant other updated at bedside today Level of care: Progressive Dispo:   The patient is from: home Anticipated d/c is to: home Anticipated d/c date is: 1-2 days   Subjective and Interval History:  Pt  desat down to 84% on room air while walking.     Objective: Vitals:   05/08/24 0437 05/08/24 0747 05/08/24 1205 05/08/24 1554  BP:  114/70 101/71 111/82  Pulse:  75 80 71  Resp:  15 14 17   Temp:  98.2 F (36.8 C) 98.1 F (36.7 C) 97.9 F (36.6 C)   TempSrc:  Oral    SpO2:  96% 96% 98%  Weight: 100.5 kg       Intake/Output Summary (Last 24 hours) at 05/08/2024 1958 Last data filed at 05/08/2024 1900 Gross per 24 hour  Intake 1357 ml  Output 1825 ml  Net -468 ml   Filed Weights   05/06/24 0500 05/07/24 0439 05/08/24 0437  Weight: 90.3 kg 102.2 kg 100.5 kg    Examination:   Constitutional: NAD, AAOx3 HEENT: conjunctivae and lids normal, EOMI CV: No cyanosis.   RESP: normal respiratory effort Neuro: II - XII grossly intact.     Data Reviewed: I have personally reviewed labs and imaging studies  Time spent: 50 minutes  Ellouise Haber, MD [facility] If 7PM-7AM, please contact night-coverage 05/08/2024, 7:58 PM

## 2024-05-08 NOTE — Consult Note (Signed)
 PHARMACY - ANTICOAGULATION CONSULT NOTE  Pharmacy Consult for Warfarin with Enoxaparin  Bridge Indication: history of mechanical aortic valve replacement  Allergies  Allergen Reactions   Latex Hives   Nicotine  Hives and Rash    Patches caused localized rash and hives     Patient Measurements: Weight: 100.5 kg (221 lb 9 oz)  Vital Signs: Temp: 98.1 F (36.7 C) (11/06 1205) Temp Source: Oral (11/06 0747) BP: 101/71 (11/06 1205) Pulse Rate: 80 (11/06 1205)  Labs: Recent Labs    05/06/24 0324 05/06/24 0919 05/07/24 0502 05/07/24 0826 05/08/24 0215  HGB 11.3*  --   --   --  12.1*  HCT 36.1*  --   --   --  38.6*  PLT 217  --   --   --  274  LABPROT  --  17.5* 17.1*  --  16.7*  INR  --  1.4* 1.3*  --  1.3*  CREATININE 1.67*  --   --  1.57* 1.61*    Estimated Creatinine Clearance: 48.1 mL/min (A) (by C-G formula based on SCr of 1.61 mg/dL (H)).   Medical History: Past Medical History:  Diagnosis Date   Aneurysm    Bicuspid aortic valve    a. s/p #27 [employer] mechanical valve on 03/25/2010; b. on Coumadin ; c. TTE 12/17: EF 40-45%, moderately dilated LV with moderate LVH, AVR well-seated with 14 mmHg gradient, peak AV velocity 2.5 m/s, mild mitral valve thickening with mild MR, mildly dilated RV with mildly reduced contraction   Cellulitis    CHF (congestive heart failure) (HCC)    Chronic kidney disease    Chronic systolic CHF (congestive heart failure) (HCC)    a. R/LHC 03/2010 showed no significant CAD, LVEDP 31 mmHg, mean AoV gradient 34 mmHg at rest and 47 mmHg with dobutamine 20 mcg/kg/min, AVA 1.0 cm^2, RA 31, RV 68/25, PA 68/47, PCWP 38. PA sat 65%. CO 6.2 L/min (Fick) and 5.3 L/min (thermodilution)   Clotting disorder    COPD (chronic obstructive pulmonary disease) (HCC)    H/O mechanical aortic valve replacement 03/25/2010   a. #27 [employer] mechanical valve   Hearing loss    High cholesterol    HTN (hypertension)    Hypercholesterolemia    Renal infarct  2017   Multiple right renal infarcts, likely embolic.   Stage 3 chronic kidney disease (HCC)    Stroke (HCC)    TIA (transient ischemic attack) 05/2014    Medications:  PTA: Enoxaparin  72mg  Q12 hours    Assessment: 68 yo male with PMH listed above who presented to the ED with encephalopathy, acute on chronic HFrEF, acute respiratory failure with hypoxia. Patient with history of mechanical aortic valve replacement and was recently switched from warfarin to treatment dose enoxaparin  in the setting of labile INRs. Per patient, was on continuing cycle of taking warfarin 4mg , 4mg  7.5mg . Seen by cardiology team, recommending to resume warfarin.   Date INR Dose  11/5 1.3 7.5mg   11/6 1.3             Goal of Therapy:  INR 2.5-3.5 Monitor platelets by anticoagulation protocol: Yes   Plan:  - Warfarin 7.5mg  x 1 - INR/CBC daily - enoxaparin  90mg  Q12 hours - will continue to bridge with therapeutic enoxaparin  until INR is WNL  Vieva Brummitt A [employer] 05/08/2024,1:10 PM

## 2024-05-08 NOTE — Progress Notes (Signed)
SATURATION QUALIFICATIONS: (This note is used to comply with regulatory documentation for home oxygen)  Patient Saturations on Room Air at Rest = 88%  Patient Saturations on Room Air while Ambulating = 84%  Patient Saturations on 2 Liters of oxygen while Ambulating = 92%  Please briefly explain why patient needs home oxygen: 

## 2024-05-09 ENCOUNTER — Encounter: Payer: Self-pay | Admitting: Internal Medicine

## 2024-05-09 ENCOUNTER — Emergency Department (HOSPITAL_COMMUNITY)

## 2024-05-09 ENCOUNTER — Inpatient Hospital Stay (HOSPITAL_COMMUNITY)
Admission: EM | Admit: 2024-05-09 | Discharge: 2024-05-14 | DRG: 265 | Disposition: A | Discharge status: Home / Self Care | Attending: Hospitalist | Admitting: Hospitalist

## 2024-05-09 ENCOUNTER — Encounter (HOSPITAL_COMMUNITY): Payer: Self-pay | Admitting: *Deleted

## 2024-05-09 ENCOUNTER — Other Ambulatory Visit: Payer: Self-pay

## 2024-05-09 DIAGNOSIS — N1832 Chronic kidney disease, stage 3b: Secondary | ICD-10-CM | POA: Diagnosis present

## 2024-05-09 DIAGNOSIS — I255 Ischemic cardiomyopathy: Secondary | ICD-10-CM

## 2024-05-09 DIAGNOSIS — Z803 Family history of malignant neoplasm of breast: Secondary | ICD-10-CM

## 2024-05-09 DIAGNOSIS — Z823 Family history of stroke: Secondary | ICD-10-CM

## 2024-05-09 DIAGNOSIS — I4891 Unspecified atrial fibrillation: Secondary | ICD-10-CM | POA: Diagnosis present

## 2024-05-09 DIAGNOSIS — J449 Chronic obstructive pulmonary disease, unspecified: Secondary | ICD-10-CM | POA: Diagnosis present

## 2024-05-09 DIAGNOSIS — K219 Gastro-esophageal reflux disease without esophagitis: Secondary | ICD-10-CM | POA: Diagnosis present

## 2024-05-09 DIAGNOSIS — I428 Other cardiomyopathies: Secondary | ICD-10-CM | POA: Diagnosis present

## 2024-05-09 DIAGNOSIS — Z952 Presence of prosthetic heart valve: Secondary | ICD-10-CM

## 2024-05-09 DIAGNOSIS — I5022 Chronic systolic (congestive) heart failure: Secondary | ICD-10-CM | POA: Diagnosis present

## 2024-05-09 DIAGNOSIS — R55 Syncope and collapse: Principal | ICD-10-CM | POA: Diagnosis present

## 2024-05-09 DIAGNOSIS — I2510 Atherosclerotic heart disease of native coronary artery without angina pectoris: Secondary | ICD-10-CM | POA: Diagnosis present

## 2024-05-09 DIAGNOSIS — I081 Rheumatic disorders of both mitral and tricuspid valves: Secondary | ICD-10-CM | POA: Diagnosis present

## 2024-05-09 DIAGNOSIS — Z7901 Long term (current) use of anticoagulants: Secondary | ICD-10-CM

## 2024-05-09 DIAGNOSIS — Z5941 Food insecurity: Secondary | ICD-10-CM

## 2024-05-09 DIAGNOSIS — I5023 Acute on chronic systolic (congestive) heart failure: Secondary | ICD-10-CM | POA: Diagnosis present

## 2024-05-09 DIAGNOSIS — Z8774 Personal history of (corrected) congenital malformations of heart and circulatory system: Secondary | ICD-10-CM

## 2024-05-09 DIAGNOSIS — I5084 End stage heart failure: Secondary | ICD-10-CM | POA: Diagnosis present

## 2024-05-09 DIAGNOSIS — Z8673 Personal history of transient ischemic attack (TIA), and cerebral infarction without residual deficits: Secondary | ICD-10-CM

## 2024-05-09 DIAGNOSIS — Z9109 Other allergy status, other than to drugs and biological substances: Secondary | ICD-10-CM

## 2024-05-09 DIAGNOSIS — Z8249 Family history of ischemic heart disease and other diseases of the circulatory system: Secondary | ICD-10-CM

## 2024-05-09 DIAGNOSIS — G909 Disorder of the autonomic nervous system, unspecified: Secondary | ICD-10-CM | POA: Diagnosis present

## 2024-05-09 DIAGNOSIS — Z8261 Family history of arthritis: Secondary | ICD-10-CM

## 2024-05-09 DIAGNOSIS — G8929 Other chronic pain: Secondary | ICD-10-CM | POA: Diagnosis present

## 2024-05-09 DIAGNOSIS — K761 Chronic passive congestion of liver: Secondary | ICD-10-CM | POA: Diagnosis present

## 2024-05-09 DIAGNOSIS — Z79899 Other long term (current) drug therapy: Secondary | ICD-10-CM

## 2024-05-09 DIAGNOSIS — I442 Atrioventricular block, complete: Secondary | ICD-10-CM | POA: Diagnosis present

## 2024-05-09 DIAGNOSIS — Z59868 Other specified financial insecurity: Secondary | ICD-10-CM

## 2024-05-09 DIAGNOSIS — Z800 Family history of malignant neoplasm of digestive organs: Secondary | ICD-10-CM

## 2024-05-09 DIAGNOSIS — F17210 Nicotine dependence, cigarettes, uncomplicated: Secondary | ICD-10-CM | POA: Diagnosis present

## 2024-05-09 DIAGNOSIS — I4821 Permanent atrial fibrillation: Secondary | ICD-10-CM | POA: Diagnosis present

## 2024-05-09 DIAGNOSIS — G250 Essential tremor: Secondary | ICD-10-CM | POA: Diagnosis present

## 2024-05-09 DIAGNOSIS — I9589 Other hypotension: Secondary | ICD-10-CM | POA: Diagnosis present

## 2024-05-09 DIAGNOSIS — Z6835 Body mass index (BMI) 35.0-35.9, adult: Secondary | ICD-10-CM

## 2024-05-09 DIAGNOSIS — Z515 Encounter for palliative care: Secondary | ICD-10-CM

## 2024-05-09 DIAGNOSIS — Z7951 Long term (current) use of inhaled steroids: Secondary | ICD-10-CM

## 2024-05-09 DIAGNOSIS — Z9181 History of falling: Secondary | ICD-10-CM

## 2024-05-09 DIAGNOSIS — R54 Age-related physical debility: Secondary | ICD-10-CM | POA: Diagnosis present

## 2024-05-09 DIAGNOSIS — Z4502 Encounter for adjustment and management of automatic implantable cardiac defibrillator: Principal | ICD-10-CM

## 2024-05-09 DIAGNOSIS — Z8679 Personal history of other diseases of the circulatory system: Secondary | ICD-10-CM

## 2024-05-09 DIAGNOSIS — E7800 Pure hypercholesterolemia, unspecified: Secondary | ICD-10-CM | POA: Diagnosis present

## 2024-05-09 DIAGNOSIS — I472 Ventricular tachycardia, unspecified: Secondary | ICD-10-CM | POA: Diagnosis present

## 2024-05-09 DIAGNOSIS — F05 Delirium due to known physiological condition: Secondary | ICD-10-CM | POA: Diagnosis not present

## 2024-05-09 DIAGNOSIS — R296 Repeated falls: Secondary | ICD-10-CM

## 2024-05-09 DIAGNOSIS — M109 Gout, unspecified: Secondary | ICD-10-CM | POA: Diagnosis present

## 2024-05-09 DIAGNOSIS — I471 Supraventricular tachycardia, unspecified: Secondary | ICD-10-CM | POA: Diagnosis present

## 2024-05-09 DIAGNOSIS — I5082 Biventricular heart failure: Secondary | ICD-10-CM | POA: Diagnosis present

## 2024-05-09 DIAGNOSIS — G4733 Obstructive sleep apnea (adult) (pediatric): Secondary | ICD-10-CM | POA: Diagnosis present

## 2024-05-09 DIAGNOSIS — I2489 Other forms of acute ischemic heart disease: Secondary | ICD-10-CM | POA: Diagnosis present

## 2024-05-09 DIAGNOSIS — I130 Hypertensive heart and chronic kidney disease with heart failure and stage 1 through stage 4 chronic kidney disease, or unspecified chronic kidney disease: Secondary | ICD-10-CM | POA: Diagnosis present

## 2024-05-09 DIAGNOSIS — F39 Unspecified mood [affective] disorder: Secondary | ICD-10-CM | POA: Diagnosis present

## 2024-05-09 DIAGNOSIS — Z604 Social exclusion and rejection: Secondary | ICD-10-CM | POA: Diagnosis present

## 2024-05-09 DIAGNOSIS — Z7984 Long term (current) use of oral hypoglycemic drugs: Secondary | ICD-10-CM

## 2024-05-09 DIAGNOSIS — H9190 Unspecified hearing loss, unspecified ear: Secondary | ICD-10-CM | POA: Diagnosis present

## 2024-05-09 DIAGNOSIS — J810 Acute pulmonary edema: Secondary | ICD-10-CM | POA: Diagnosis not present

## 2024-05-09 DIAGNOSIS — Z91040 Latex allergy status: Secondary | ICD-10-CM

## 2024-05-09 DIAGNOSIS — R0902 Hypoxemia: Secondary | ICD-10-CM | POA: Diagnosis present

## 2024-05-09 DIAGNOSIS — Z5982 Transportation insecurity: Secondary | ICD-10-CM

## 2024-05-09 DIAGNOSIS — Z833 Family history of diabetes mellitus: Secondary | ICD-10-CM

## 2024-05-09 DIAGNOSIS — I7121 Aneurysm of the ascending aorta, without rupture: Secondary | ICD-10-CM | POA: Diagnosis present

## 2024-05-09 LAB — CBC WITH DIFFERENTIAL/PLATELET
Abs Immature Granulocytes: 0.04 K/uL (ref 0.00–0.07)
Basophils Absolute: 0 K/uL (ref 0.0–0.1)
Basophils Relative: 0 %
Eosinophils Absolute: 0.1 K/uL (ref 0.0–0.5)
Eosinophils Relative: 1 %
HCT: 38 % — ABNORMAL LOW (ref 39.0–52.0)
Hemoglobin: 12.2 g/dL — ABNORMAL LOW (ref 13.0–17.0)
Immature Granulocytes: 1 %
Lymphocytes Relative: 8 %
Lymphs Abs: 0.6 K/uL — ABNORMAL LOW (ref 0.7–4.0)
MCH: 29.3 pg (ref 26.0–34.0)
MCHC: 32.1 g/dL (ref 30.0–36.0)
MCV: 91.3 fL (ref 80.0–100.0)
Monocytes Absolute: 0.9 K/uL (ref 0.1–1.0)
Monocytes Relative: 11 %
Neutro Abs: 6.2 K/uL (ref 1.7–7.7)
Neutrophils Relative %: 79 %
Platelets: 330 K/uL (ref 150–400)
RBC: 4.16 MIL/uL — ABNORMAL LOW (ref 4.22–5.81)
RDW: 17.7 % — ABNORMAL HIGH (ref 11.5–15.5)
WBC: 7.8 K/uL (ref 4.0–10.5)
nRBC: 0 % (ref 0.0–0.2)

## 2024-05-09 LAB — COMPREHENSIVE METABOLIC PANEL WITH GFR
ALT: 147 U/L — ABNORMAL HIGH (ref 0–44)
ALT: 121 U/L — ABNORMAL HIGH (ref 0–44)
AST: 64 U/L — ABNORMAL HIGH (ref 15–41)
AST: 47 U/L — ABNORMAL HIGH (ref 15–41)
Albumin: 3.2 g/dL — ABNORMAL LOW (ref 3.5–5.0)
Albumin: 3.1 g/dL — ABNORMAL LOW (ref 3.5–5.0)
Alkaline Phosphatase: 72 U/L (ref 38–126)
Alkaline Phosphatase: 72 U/L (ref 38–126)
Anion gap: 11 (ref 5–15)
Anion gap: 17 — ABNORMAL HIGH (ref 5–15)
BUN: 24 mg/dL — ABNORMAL HIGH (ref 8–23)
BUN: 23 mg/dL (ref 8–23)
CO2: 28 mmol/L (ref 22–32)
CO2: 20 mmol/L — ABNORMAL LOW (ref 22–32)
Calcium: 8.7 mg/dL — ABNORMAL LOW (ref 8.9–10.3)
Calcium: 9 mg/dL (ref 8.9–10.3)
Chloride: 101 mmol/L (ref 98–111)
Chloride: 102 mmol/L (ref 98–111)
Creatinine, Ser: 1.63 mg/dL — ABNORMAL HIGH (ref 0.61–1.24)
Creatinine, Ser: 1.85 mg/dL — ABNORMAL HIGH (ref 0.61–1.24)
GFR, Estimated: 45 mL/min — ABNORMAL LOW (ref >60)
GFR, Estimated: 39 mL/min — ABNORMAL LOW (ref >60)
Glucose, Bld: 93 mg/dL (ref 70–99)
Glucose, Bld: 125 mg/dL — ABNORMAL HIGH (ref 70–99)
Potassium: 4.7 mmol/L (ref 3.5–5.1)
Potassium: 3.5 mmol/L (ref 3.5–5.1)
Sodium: 140 mmol/L (ref 135–145)
Sodium: 139 mmol/L (ref 135–145)
Total Bilirubin: 1.5 mg/dL — ABNORMAL HIGH (ref 0.0–1.2)
Total Bilirubin: 0.7 mg/dL (ref 0.0–1.2)
Total Protein: 7.4 g/dL (ref 6.5–8.1)
Total Protein: 6.3 g/dL — ABNORMAL LOW (ref 6.5–8.1)

## 2024-05-09 LAB — MAGNESIUM
Magnesium: 2.2 mg/dL (ref 1.7–2.4)
Magnesium: 1.7 mg/dL (ref 1.7–2.4)

## 2024-05-09 LAB — URINALYSIS, ROUTINE W REFLEX MICROSCOPIC
Bacteria, UA: NONE SEEN
Bilirubin Urine: NEGATIVE
Glucose, UA: >=500 mg/dL — AB
Hgb urine dipstick: NEGATIVE
Ketones, ur: NEGATIVE mg/dL
Leukocytes,Ua: NEGATIVE
Nitrite: NEGATIVE
Protein, ur: 30 mg/dL — AB
Specific Gravity, Urine: 1.015 (ref 1.005–1.030)
pH: 5 (ref 5.0–8.0)

## 2024-05-09 LAB — PROTIME-INR
INR: 1.6 — ABNORMAL HIGH (ref 0.8–1.2)
INR: 2.2 — ABNORMAL HIGH (ref 0.8–1.2)
Prothrombin Time: 20.3 s — ABNORMAL HIGH (ref 11.4–15.2)
Prothrombin Time: 25.9 s — ABNORMAL HIGH (ref 11.4–15.2)

## 2024-05-09 LAB — TROPONIN I (HIGH SENSITIVITY): Troponin I (High Sensitivity): 47 ng/L — ABNORMAL HIGH (ref <18)

## 2024-05-09 LAB — BRAIN NATRIURETIC PEPTIDE: B Natriuretic Peptide: 749.4 pg/mL — ABNORMAL HIGH (ref 0.0–100.0)

## 2024-05-09 MED ORDER — OXYCODONE HCL 5 MG PO TABS
5.0000 mg | ORAL_TABLET | Freq: Once | ORAL | Status: AC
  Administered 2024-05-09: 5 mg via ORAL
  Filled 2024-05-09: qty 1

## 2024-05-09 MED ORDER — MIDODRINE HCL 10 MG PO TABS: 10.0000 mg | ORAL_TABLET | Freq: Three times a day (TID) | ORAL | 2 refills | Status: DC

## 2024-05-09 MED ORDER — SPIRONOLACTONE 25 MG PO TABS: 12.5000 mg | ORAL_TABLET | Freq: Every day | ORAL | 2 refills | Status: DC

## 2024-05-09 MED ORDER — ENSURE PLUS HIGH PROTEIN PO LIQD: 237.0000 mL | Freq: Two times a day (BID) | ORAL | Status: DC

## 2024-05-09 MED ORDER — ACETAMINOPHEN 500 MG PO TABS
1000.0000 mg | ORAL_TABLET | Freq: Once | ORAL | Status: AC
  Administered 2024-05-09: 1000 mg via ORAL
  Filled 2024-05-09: qty 2

## 2024-05-09 MED ORDER — WARFARIN SODIUM 7.5 MG PO TABS
7.5000 mg | ORAL_TABLET | Freq: Once | ORAL | Status: DC
  Filled 2024-05-09: qty 1

## 2024-05-09 MED ORDER — METOPROLOL SUCCINATE ER 25 MG PO TB24: 12.5000 mg | ORAL_TABLET | Freq: Every day | ORAL | 2 refills | Status: DC

## 2024-05-09 NOTE — Discharge Summary (Signed)
 Physician Discharge Summary   Roberto [name]  male DOB: [birth year]  FMW:[id number]  PCP: [name], DO  Admit date: 05/05/2024 Discharge date: 05/09/2024  Admitted From: home Disposition:  home Wife updated at bedside prior to discharge. CODE STATUS: Full code  Discharge Instructions     Diet - low sodium heart healthy   Complete by: As directed       Hospital Course:  For full details, please see H&P, progress notes, consult notes and ancillary notes.  Briefly,  Roberto [name] is a 68 y.o. male with medical history significant of essential hypertension, bicuspid aortic valve status post mechanical valve replacement, CVA, CKD stage IIIb, chronic HFrEF, s/p ICD presenting with confusion.  Patient noted to have been recently admitted October 29 through October 31 for issues including acute metabolic encephalopathy, acute on chronic hypotension.  Per the wife, patient had multiple falls after discharge.  No reports of focal hemiparesis.  Has had generalized weakness as well as confusion which seemed to be somewhat chronic.  There was some concern for dementia in the outpatient setting.  However, wife reports that that was not fully worked up because of other pressing issues by PCP.    Acute metabolic Encephalopathy Patient was reportedly having generalized confusion on presentation.  Questionable how much of this is acute versus chronic.  Documented similar presentation on most recent admission.  Hypotension and hypoxia may be confounding issues.  No formal diagnosis of dementia.  CT head with some global atrophy.  Ammonia within normal limits. --mental status back to baseline now prior to discharge.   Acute respiratory failure with hypoxia (HCC) Acute on chronic HFrEF  2D ECHO 09/2023 with EF 20-25% and grade 2 diastolic dysfunction s/p BiV ICD. --Decompensated respiratory status requiring 2 L nasal cannula with O2 sats around 80% on room air on presentation BNP 471 with  noted volume overload and cardiomegaly on chest imaging. --Case discussed with cardiology.  Patient's prognosis is poor.  Dr. Mady spoke to the patient and his wife extensively.  Patient wants to go home however he is very likely to bounce back within a few days.   --palliative care consulted, continue full code. --received IV lasix  40 BID x 7 doses.   --due to low BP, Toprol  reduced from 50 to 12.5 mg daily, aldactone  reduced from 25 mg to 12.5 mg daily. --cont home torsemide  --pt was able to maintain O2 sats in 90's while walking prior to discharge.   Hypokalemia --from diuresis --monitored and supplemented PRN   Hypotension, chronic --increased midodrine  from 2.5 mg TID to 10 mg TID.   COPD (chronic obstructive pulmonary disease) (HCC) Does not appear to be in active COPD exacerbation  --cont bronchodilators   H/O mechanical aortic valve replacement --pt supposed to be on warfarin, however, INR has not been therapeutic for months.  Tx-dose lovenox  was prescribed by pt's PCP for bridging. --cont warfarin and lovenox , f/u with PCP for INR checks.   Atrial Fibrillation Ventricular paced rhythm Patient with BiV ICD in place. --hold home amiodarone  due to elevated LFT's. --cont warfarin and lovenox , f/u with PCP for INR checks.  Transaminitis No active abd pain  RUQ u/s WNL  ETOH and tylenol  level WNL  --improved prior to discharge. --Hold fenofibrate , statin and amiodarone  per cardio   Elevated troponin Trop 30s-40s on presentation  No active CP  Looks to be baseline    Pre-diabetes  Hemoglobin A1c 5.9.   --not taking metformin  and Ozempic  PTA  Essential hypertension, not currently active   Chronic kidney disease, stage 3a (HCC)  Hx of Nicotine  dependence, cigarettes, uncomplicated Patient/ wife deny any active tobacco use at present    Discharge Diagnoses:  Principal Problem:   Acute pulmonary edema (HCC) Active Problems:   Ventricular tachycardia (HCC)    Encephalopathy   Hypotension   Acute respiratory failure with hypoxia (HCC)   H/O mechanical aortic valve replacement   COPD (chronic obstructive pulmonary disease) (HCC)   Prolonged QT interval   Type 2 diabetes mellitus with chronic kidney disease, without long-term current use of insulin  (HCC)   Elevated troponin   Transaminitis   Nicotine  dependence, cigarettes, uncomplicated   Chronic kidney disease, stage 3a (HCC)   Acute on chronic HFrEF (heart failure with reduced ejection fraction) (HCC)   Essential hypertension   Altered mental status   Lactic acidosis   30 Day Unplanned Readmission Risk Score    Flowsheet Row ED to Hosp-Admission (Current) from 05/05/2024 in [facility]  30 Day Unplanned Readmission Risk Score (%) 47.79 Filed at 05/09/2024 1200    This score is the patient's risk of an unplanned readmission within 30 days of being discharged (0 -100%). The score is based on dignosis, age, lab data, medications, orders, and past utilization.   Low:  0-14.9   Medium: 15-21.9   High: 22-29.9   Extreme: 30 and above         Discharge Instructions:  Allergies as of 05/09/2024       Reactions   Latex Hives   Nicotine  Hives, Rash   Patches caused localized rash and hives         Medication List     PAUSE taking these medications    amiodarone  200 MG tablet Wait to take this until your doctor or other care provider tells you to start again. Until followup with cardiology. Commonly known as: PACERONE  Take 2 tablets by mouth twice daily   fenofibrate  48 MG tablet Wait to take this until your doctor or other care provider tells you to start again. Until followup with cardiology. Commonly known as: TRICOR  Take 48 mg by mouth daily.   rosuvastatin  40 MG tablet Wait to take this until your doctor or other care provider tells you to start again. Until followup with cardiology. Commonly known as: CRESTOR  Take 1 tablet (40 mg total) by  mouth daily.       STOP taking these medications    lidocaine  5 % Commonly known as: Lidoderm    metFORMIN  500 MG 24 hr tablet Commonly known as: GLUCOPHAGE -XR   Ozempic  (1 MG/DOSE) 2 MG/1.5ML Sopn Generic drug: Semaglutide  (1 MG/DOSE)   traMADol 50 MG tablet Commonly known as: Ultram       TAKE these medications    allopurinol  100 MG tablet Commonly known as: ZYLOPRIM  Take 1 tablet (100 mg total) by mouth daily.   baclofen  10 MG tablet Commonly known as: LIORESAL  Take 1 tablet (10 mg total) by mouth 2 (two) times daily. Take before food twice a day, stop if not helping   buPROPion  300 MG 24 hr tablet Commonly known as: WELLBUTRIN  XL Take 300 mg by mouth daily.   dapagliflozin  propanediol 10 MG Tabs tablet Commonly known as: Farxiga  Take 1 tablet (10 mg total) by mouth daily before breakfast.   enoxaparin  120 MG/0.8ML injection Commonly known as: LOVENOX  INJECT 0.72 ML (108MG ) INTO THE SKIN EVERY 12 HOURS   feeding supplement Liqd Take 237 mLs by mouth  2 (two) times daily between meals.   metoprolol  succinate 25 MG 24 hr tablet Commonly known as: TOPROL -XL Take 0.5 tablets (12.5 mg total) by mouth daily. Reduced from 50 mg daily. What changed:  medication strength how much to take additional instructions   mexiletine 150 MG capsule Commonly known as: MEXITIL  Take 150 mg by mouth 3 (three) times daily.   midodrine  10 MG tablet Commonly known as: PROAMATINE  Take 1 tablet (10 mg total) by mouth 3 (three) times daily with meals. Increased from 2.5 mg. What changed:  medication strength how much to take when to take this additional instructions   nortriptyline  10 MG capsule Commonly known as: PAMELOR  Take 1 capsule by mouth at bedtime   OMEGA 3 PO Take 1,500 mg by mouth 2 (two) times daily.   ondansetron  4 MG disintegrating tablet Commonly known as: ZOFRAN -ODT Take 1 tablet (4 mg total) by mouth every 8 (eight) hours as needed for nausea or  vomiting (before eating).   pantoprazole  40 MG tablet Commonly known as: PROTONIX  Take 1 tablet (40 mg total) by mouth 2 (two) times daily. Then go back to once a day What changed: additional instructions   potassium chloride  10 MEQ CR capsule Commonly known as: MICRO-K  Take 1 capsule (10 mEq total) by mouth daily.   Spacer/Aero-Holding [employer] Use as directed Dx: COPD, J44.9   spironolactone  25 MG tablet Commonly known as: ALDACTONE  Take 0.5 tablets (12.5 mg total) by mouth daily. What changed: how much to take   torsemide  20 MG tablet Commonly known as: DEMADEX  Take 1 tablet (20 mg total) by mouth daily.   traZODone  50 MG tablet Commonly known as: DESYREL  Take 0.5 tablets (25 mg total) by mouth at bedtime as needed for sleep.   Trelegy Ellipta  200-62.5-25 MCG/ACT Aepb Generic drug: Fluticasone -Umeclidin-Vilant Inhale 1 puff into the lungs daily in the afternoon.   Ventolin  HFA 108 (90 Base) MCG/ACT inhaler Generic drug: albuterol  INHALE 2 PUFFS BY MOUTH EVERY 6 HOURS AS NEEDED FOR WHEEZING OR SHORTNESS OF BREATH   albuterol  (2.5 MG/3ML) 0.083% nebulizer solution Commonly known as: PROVENTIL  USE 1 VIAL IN NEBULIZER EVERY 6 HOURS AS NEEDED FOR WHEEZING FOR SHORTNESS OF BREATH   warfarin 3 MG tablet Commonly known as: COUMADIN  Take as directed. If you are unsure how to take this medication, talk to your nurse or doctor. Original instructions: Take 3 mg by mouth daily.   warfarin 1 MG tablet Commonly known as: COUMADIN  Take as directed. If you are unsure how to take this medication, talk to your nurse or doctor. Original instructions: Take 1 mg by mouth daily.         Follow-up Information     [facility]. Go on 05/13/2024.   Specialty: Cardiology Why: Hospital Follow-Up-05/13/2024 @ 8:30 Please bring all medications to follow-up appointment [facility], [address] Second Floor Free Valet Parking at  the door Contact information: [address] [place]  [address] [phone]        Vicci Bouchard P, DO Follow up in 1 week(s).   Specialty: Family Medicine Contact information: [address] [place] [address] [phone]                 Allergies  Allergen Reactions   Latex Hives   Nicotine  Hives and Rash    Patches caused localized rash and hives      The results of significant diagnostics from this hospitalization (  including imaging, microbiology, ancillary and laboratory) are listed below for reference.   Consultations:   Procedures/Studies: ECHOCARDIOGRAM LIMITED Result Date: 05/05/2024    ECHOCARDIOGRAM LIMITED REPORT   Patient Name:   PAIGE VANDERWOUDE Date of Exam: 05/05/2024 Medical Rec #:  [id number]       Height:       66.0 in Accession #:    [id number]      Weight:       196.8 lb Date of Birth:  07-09-1955       BSA:          1.986 m Patient Age:    68 years        BP:           112/78 mmHg Patient Gender: M               HR:           80 bpm. Exam Location:  [facility] Procedure: Limited Echo and Color Doppler (Both Spectral and Color Flow Doppler            were utilized during procedure). Indications:     Congestive Heart Failure I50.9  History:         Patient has prior history of Echocardiogram examinations, most                  recent 09/27/2023. CHF.  Sonographer:     Ashley McNeely-Sloane Referring Phys:  [id number] [facility] L CAREY Diagnosing Phys: Deatrice Cage MD IMPRESSIONS  1. Left ventricular ejection fraction, by estimation, is <20%. The left ventricle has severely decreased function. Left ventricular endocardial border not optimally defined to evaluate regional wall motion. There is moderate left ventricular hypertrophy.  2. Right ventricular systolic function is moderately reduced. The right ventricular size is mildly enlarged. Tricuspid regurgitation signal is inadequate for assessing PA pressure.  3. The mitral valve is normal in  structure. Moderate mitral valve regurgitation. No evidence of mitral stenosis.  4. Tricuspid valve regurgitation is moderate to severe.  5. The aortic valve has been repaired/replaced. Aortic valve regurgitation is not visualized. Gradients were not measured.  6. The inferior vena cava is dilated in size with <50% respiratory variability, suggesting right atrial pressure of 15 mmHg. FINDINGS  Left Ventricle: Left ventricular ejection fraction, by estimation, is <20%. The left ventricle has severely decreased function. Left ventricular endocardial border not optimally defined to evaluate regional wall motion. The left ventricular internal cavity size was normal in size. There is moderate left ventricular hypertrophy. Right Ventricle: The right ventricular size is mildly enlarged. No increase in right ventricular wall thickness. Right ventricular systolic function is moderately reduced. Tricuspid regurgitation signal is inadequate for assessing PA pressure. Left Atrium: Left atrial size was normal in size. Right Atrium: Right atrial size was normal in size. Pericardium: There is no evidence of pericardial effusion. Mitral Valve: The mitral valve is normal in structure. Moderate mitral valve regurgitation. No evidence of mitral valve stenosis. Tricuspid Valve: The tricuspid valve is normal in structure. Tricuspid valve regurgitation is moderate to severe. No evidence of tricuspid stenosis. Aortic Valve: The aortic valve has been repaired/replaced. Aortic valve regurgitation is not visualized. Pulmonic Valve: The pulmonic valve was normal in structure. Pulmonic valve regurgitation is not visualized. No evidence of pulmonic stenosis. Aorta: The aortic root is normal in size and structure. Venous: The inferior vena cava is dilated in size with less than 50% respiratory variability, suggesting right atrial pressure of 15 mmHg. IAS/Shunts: No atrial  level shunt detected by color flow Doppler. Additional Comments: A device  lead is visualized. Color Doppler performed.  LEFT VENTRICLE PLAX 2D LVIDd:         5.80 cm LVIDs:         6.20 cm LV PW:         1.80 cm LV IVS:        1.10 cm  LV Volumes (MOD) LV vol d, MOD A2C: 150.0 ml LV vol d, MOD A4C: 188.0 ml LV vol s, MOD A2C: 122.0 ml LV vol s, MOD A4C: 145.0 ml LV SV MOD A2C:     28.0 ml LV SV MOD A4C:     188.0 ml LV SV MOD BP:      32.9 ml RIGHT VENTRICLE         IVC TAPSE (M-mode): 1.5 cm  IVC diam: 2.85 cm Deatrice Cage MD Electronically signed by Deatrice Cage MD Signature Date/Time: 05/05/2024/4:35:15 PM    Final    US  ABDOMEN LIMITED RUQ (LIVER/GB) Result Date: 05/05/2024 EXAM: Right Upper Quadrant Abdominal Ultrasound 05/05/2024 03:57:44 AM TECHNIQUE: Real-time ultrasonography of the right upper quadrant of the abdomen was performed. COMPARISON: US  Abdomen 03/07/2024. CLINICAL HISTORY: LFTs abnormal. FINDINGS: LIVER: Liver length measures 1.7 x 1.6 x 1.3 cm. The liver is heterogeneously echogenic and demonstrates a nodular ventral contour characteristic of cirrhosis. There is a simple cyst within the right hepatic lobe measuring approximately 16 mm in diameter. There is normal hepatopetal blood flow within the main portal vein. No intrahepatic biliary ductal dilatation. BILIARY SYSTEM: Gallbladder wall thickness measures 0.4 cm. No pericholecystic fluid. No cholelithiasis. Negative sonographic Murphy's sign. The patient is not focally tender over the gallbladder. Common bile duct measures 0.3 cm. OTHER: No right upper quadrant ascites. IMPRESSION: 1. Heterogeneous echogenicity and nodular ventral hepatic contour, characteristic of cirrhosis. Electronically signed by: Evalene Coho MD 05/05/2024 04:39 AM EST RP Workstation: [id number]   CT Head Wo Contrast Result Date: 05/05/2024 EXAM: CT HEAD WITHOUT CONTRAST 05/05/2024 01:27:13 AM TECHNIQUE: CT of the head was performed without the administration of intravenous contrast. Automated exposure control, iterative  reconstruction, and/or weight based adjustment of the mA/kV was utilized to reduce the radiation dose to as low as reasonably achievable. COMPARISON: 04/30/2024 CLINICAL HISTORY: Mental status change, unknown cause. FINDINGS: BRAIN AND VENTRICLES: No acute hemorrhage. No evidence of acute infarct. No hydrocephalus. No extra-axial collection. No mass effect or midline shift. There is diffuse cerebral atrophy and chronic small vessel disease throughout the deep white matter. ORBITS: No acute abnormality. SINUSES: No acute abnormality. SOFT TISSUES AND SKULL: No acute soft tissue abnormality. No skull fracture. IMPRESSION: 1. No acute intracranial abnormality. 2. Diffuse cerebral atrophy and chronic small vessel disease throughout the deep white matter. Electronically signed by: Franky Crease MD 05/05/2024 01:32 AM EST RP Workstation: [id number]   DG Chest Port 1 View Result Date: 05/05/2024 EXAM: 1 VIEW XRAY OF THE CHEST 05/05/2024 01:17:36 AM COMPARISON: 04/30/2024 CLINICAL HISTORY: AMS hypoxemia hx CHF FINDINGS: LINES, TUBES AND DEVICES: Left AICD is unchanged. LUNGS AND PLEURA: Vascular congestion. Suspect early interstitial edema. No focal pulmonary opacity. No pleural effusion. No pneumothorax. HEART AND MEDIASTINUM: Cardiomegaly. No acute abnormality of the mediastinal silhouette. BONES AND SOFT TISSUES: No acute osseous abnormality. IMPRESSION: 1. Cardiomegaly with vascular congestion and suspected early interstitial edema. Electronically signed by: Franky Crease MD 05/05/2024 01:32 AM EST RP Workstation: [id number]   DG Lumbar Spine Complete Result Date: 05/02/2024 EXAM: 4 VIEW(S) XRAY OF THE LUMBAR SPINE  05/02/2024 06:43:00 PM COMPARISON: 11/15/2018 CLINICAL HISTORY: back pain FINDINGS: LUMBAR SPINE: BONES: Minimal anterolisthesis of L4-L5 is noted. No acute fracture. No aggressive appearing osseous lesion. DISCS AND DEGENERATIVE CHANGES: Minimal degenerative disc disease is noted at L1-L2, L2-L3, and  L3-L4. Posterior facet joint hypertrophy is noted at L4-L5. SOFT TISSUES: No acute abnormality. IMPRESSION: 1. Minimal anterolisthesis at L4-5 due to posterior facet arthropathy. 2. Mild multilevel degenerative disc disease at L1-2, L2-3, and L3-4. Electronically signed by: Lynwood Seip MD 05/02/2024 06:53 PM EDT RP Workstation: [id number]   DG Chest Portable 1 View Result Date: 04/30/2024 CLINICAL DATA:  Weakness. EXAM: PORTABLE CHEST 1 VIEW COMPARISON:  Chest radiograph dated 02/08/2024. FINDINGS: There is cardiomegaly with mild central vascular congestion. No focal consolidation, pleural effusion or pneumothorax. Left pectoral AICD device. No acute osseous pathology. Sternal fixation hardware. IMPRESSION: Cardiomegaly with mild central vascular congestion. Electronically Signed   By: Vanetta Chou M.D.   On: 04/30/2024 20:15   CT Head Wo Contrast Result Date: 04/30/2024 EXAM: CT HEAD AND CERVICAL SPINE 04/30/2024 07:39:30 PM TECHNIQUE: CT of the head and cervical spine was performed without the administration of intravenous contrast. Multiplanar reformatted images are provided for review. Automated exposure control, iterative reconstruction, and/or weight based adjustment of the mA/kV was utilized to reduce the radiation dose to as low as reasonably achievable. COMPARISON: None available. CLINICAL HISTORY: Head trauma, minor (Age >= 65y). RN notes: Pt arrived via EMS from home after wife drove him to the EMS base for AMS. EMS states wife said his last known well was this a.m. before she went to work and had minor hallucinations when she came home. FINDINGS: CT HEAD BRAIN AND VENTRICLES: No acute intracranial hemorrhage. No mass effect or midline shift. No abnormal extra-axial fluid collection. No evidence of acute infarct. No hydrocephalus. ORBITS: No acute abnormality. SINUSES AND MASTOIDS: No acute abnormality. SOFT TISSUES AND SKULL: No acute skull fracture. No acute soft tissue abnormality. CT  CERVICAL SPINE BONES AND ALIGNMENT: No acute fracture or traumatic malalignment. DEGENERATIVE CHANGES: No significant degenerative changes. SOFT TISSUES: No prevertebral soft tissue swelling. IMPRESSION: 1. No acute intracranial abnormality. 2. No acute fracture or traumatic malalignment of the cervical spine. Electronically signed by: Dorethia Molt MD 04/30/2024 07:48 PM EDT RP Workstation: [id number]   CT Cervical Spine Wo Contrast Result Date: 04/30/2024 EXAM: CT HEAD AND CERVICAL SPINE 04/30/2024 07:39:30 PM TECHNIQUE: CT of the head and cervical spine was performed without the administration of intravenous contrast. Multiplanar reformatted images are provided for review. Automated exposure control, iterative reconstruction, and/or weight based adjustment of the mA/kV was utilized to reduce the radiation dose to as low as reasonably achievable. COMPARISON: None available. CLINICAL HISTORY: Head trauma, minor (Age >= 65y). RN notes: Pt arrived via EMS from home after wife drove him to the EMS base for AMS. EMS states wife said his last known well was this a.m. before she went to work and had minor hallucinations when she came home. FINDINGS: CT HEAD BRAIN AND VENTRICLES: No acute intracranial hemorrhage. No mass effect or midline shift. No abnormal extra-axial fluid collection. No evidence of acute infarct. No hydrocephalus. ORBITS: No acute abnormality. SINUSES AND MASTOIDS: No acute abnormality. SOFT TISSUES AND SKULL: No acute skull fracture. No acute soft tissue abnormality. CT CERVICAL SPINE BONES AND ALIGNMENT: No acute fracture or traumatic malalignment. DEGENERATIVE CHANGES: No significant degenerative changes. SOFT TISSUES: No prevertebral soft tissue swelling. IMPRESSION: 1. No acute intracranial abnormality. 2. No acute fracture or traumatic malalignment of  the cervical spine. Electronically signed by: Dorethia Molt MD 04/30/2024 07:48 PM EDT RP Workstation: [id number]      Labs: BNP (last 3  results) Recent Labs    03/08/24 1208 03/19/24 1600 05/05/24 0114  BNP 421.1* 773.9* 470.9*   Basic Metabolic Panel: Recent Labs  Lab 05/05/24 0114 05/06/24 0324 05/07/24 0458 05/07/24 0826 05/08/24 0215 05/09/24 0318  NA 136 142  --  137 140 140  K 3.8 4.0  --  3.2* 3.4* 4.7  CL 99 105  --  101 100 101  CO2 20* 28  --  24 26 28   GLUCOSE 125* 77  --  111* 116* 93  BUN 25* 22  --  21 23 24*  CREATININE 2.09* 1.67*  --  1.57* 1.61* 1.63*  CALCIUM  8.8* 9.0  --  8.6* 8.9 8.7*  MG 1.8  --  2.2  --  2.0 2.2   Liver Function Tests: Recent Labs  Lab 05/05/24 0114 05/06/24 0324 05/07/24 0826 05/08/24 0215 05/09/24 0318  AST 209* 531* 208* 107* 64*  ALT 119* 301* 257* 196* 147*  ALKPHOS 63 60 68 74 72  BILITOT 1.2 1.0 1.0 0.9 1.5*  PROT 7.2 6.2* 6.8 6.9 7.4  ALBUMIN 3.2* 2.9* 3.0* 3.1* 3.2*   No results for input(s): LIPASE, AMYLASE in the last 168 hours. Recent Labs  Lab 05/05/24 0119  AMMONIA 24   CBC: Recent Labs  Lab 05/05/24 0114 05/06/24 0324 05/08/24 0215  WBC 7.2 5.4 7.3  NEUTROABS 6.0  --   --   HGB 13.1 11.3* 12.1*  HCT 40.7 36.1* 38.6*  MCV 90.0 90.9 91.3  PLT 258 217 274   Cardiac Enzymes: Recent Labs  Lab 05/05/24 0129  CKTOTAL 241   BNP: Invalid input(s): POCBNP CBG: Recent Labs  Lab 05/05/24 1251 05/05/24 2347 05/06/24 0735 05/06/24 1238 05/06/24 1639  GLUCAP 78 82 85 132* 106*   D-Dimer No results for input(s): DDIMER in the last 72 hours. Hgb A1c No results for input(s): HGBA1C in the last 72 hours. Lipid Profile No results for input(s): CHOL, HDL, LDLCALC, TRIG, CHOLHDL, LDLDIRECT in the last 72 hours. Thyroid  function studies No results for input(s): TSH, T4TOTAL, T3FREE, THYROIDAB in the last 72 hours.  Invalid input(s): FREET3 Anemia work up No results for input(s): VITAMINB12, FOLATE, FERRITIN, TIBC, IRON, RETICCTPCT in the last 72 hours. Urinalysis    Component Value  Date/Time   COLORURINE YELLOW (A) 05/05/2024 0446   APPEARANCEUR CLEAR (A) 05/05/2024 0446   APPEARANCEUR Clear 05/16/2023 0849   LABSPEC 1.006 05/05/2024 0446   LABSPEC 1.005 05/24/2014 1157   PHURINE 6.0 05/05/2024 0446   GLUCOSEU >=500 (A) 05/05/2024 0446   GLUCOSEU Negative 05/24/2014 1157   HGBUR NEGATIVE 05/05/2024 0446   BILIRUBINUR NEGATIVE 05/05/2024 0446   BILIRUBINUR Negative 05/16/2023 0849   BILIRUBINUR Negative 05/24/2014 1157   KETONESUR NEGATIVE 05/05/2024 0446   PROTEINUR NEGATIVE 05/05/2024 0446   NITRITE NEGATIVE 05/05/2024 0446   LEUKOCYTESUR NEGATIVE 05/05/2024 0446   LEUKOCYTESUR Negative 05/24/2014 1157   Sepsis Labs Recent Labs  Lab 05/05/24 0114 05/06/24 0324 05/08/24 0215  WBC 7.2 5.4 7.3   Microbiology Recent Results (from the past 240 hours)  Culture, blood (routine x 2)     Status: None (Preliminary result)   Collection Time: 05/05/24  1:10 AM   Specimen: Right Antecubital; Blood  Result Value Ref Range Status   Specimen Description RIGHT ANTECUBITAL  Final   Special Requests   Final  BOTTLES DRAWN AEROBIC AND ANAEROBIC Blood Culture adequate volume   Culture   Final    NO GROWTH 4 DAYS Performed at [facility], [address] [place], [place] [address]    Report Status PENDING  Incomplete  Culture, blood (routine x 2)     Status: None (Preliminary result)   Collection Time: 05/05/24  1:14 AM   Specimen: Left Antecubital; Blood  Result Value Ref Range Status   Specimen Description LEFT ANTECUBITAL  Final   Special Requests   Final    BOTTLES DRAWN AEROBIC AND ANAEROBIC Blood Culture adequate volume   Culture   Final    NO GROWTH 4 DAYS Performed at [facility], [address] [place], [place] [address]    Report Status PENDING  Incomplete  Resp panel by RT-PCR (RSV, Flu A&B, Covid) Anterior Nasal Swab     Status: None   Collection Time: 05/05/24  1:14 AM   Specimen: Anterior Nasal Swab  Result Value Ref  Range Status   SARS Coronavirus 2 by RT PCR NEGATIVE NEGATIVE Final    Comment: (NOTE) SARS-CoV-2 target nucleic acids are NOT DETECTED.  The SARS-CoV-2 RNA is generally detectable in upper respiratory specimens during the acute phase of infection. The lowest concentration of SARS-CoV-2 viral copies this assay can detect is 138 copies/mL. A negative result does not preclude SARS-Cov-2 infection and should not be used as the sole basis for treatment or other patient management decisions. A negative result may occur with  improper specimen collection/handling, submission of specimen other than nasopharyngeal swab, presence of viral mutation(s) within the areas targeted by this assay, and inadequate number of viral copies(<138 copies/mL). A negative result must be combined with clinical observations, patient history, and epidemiological information. The expected result is Negative.  Fact Sheet for Patients:  bloggercourse.com  Fact Sheet for Healthcare Providers:  seriousbroker.it  This test is no t yet approved or cleared by the [place]  FDA and  has been authorized for detection and/or diagnosis of SARS-CoV-2 by FDA under an Emergency Use Authorization (EUA). This EUA will remain  in effect (meaning this test can be used) for the duration of the COVID-19 declaration under Section 564(b)(1) of the Act, 21 U.S.C.section 360bbb-3(b)(1), unless the authorization is terminated  or revoked sooner.       Influenza A by PCR NEGATIVE NEGATIVE Final   Influenza B by PCR NEGATIVE NEGATIVE Final    Comment: (NOTE) The Xpert Xpress SARS-CoV-2/FLU/RSV plus assay is intended as an aid in the diagnosis of influenza from Nasopharyngeal swab specimens and should not be used as a sole basis for treatment. Nasal washings and aspirates are unacceptable for Xpert Xpress SARS-CoV-2/FLU/RSV testing.  Fact Sheet for  Patients: bloggercourse.com  Fact Sheet for Healthcare Providers: seriousbroker.it  This test is not yet approved or cleared by the [place]  FDA and has been authorized for detection and/or diagnosis of SARS-CoV-2 by FDA under an Emergency Use Authorization (EUA). This EUA will remain in effect (meaning this test can be used) for the duration of the COVID-19 declaration under Section 564(b)(1) of the Act, 21 U.S.C. section 360bbb-3(b)(1), unless the authorization is terminated or revoked.     Resp Syncytial Virus by PCR NEGATIVE NEGATIVE Final    Comment: (NOTE) Fact Sheet for Patients: bloggercourse.com  Fact Sheet for Healthcare Providers: seriousbroker.it  This test is not yet approved or cleared by the [place]  FDA and has been authorized for detection and/or diagnosis of SARS-CoV-2 by FDA  under an Emergency Use Authorization (EUA). This EUA will remain in effect (meaning this test can be used) for the duration of the COVID-19 declaration under Section 564(b)(1) of the Act, 21 U.S.C. section 360bbb-3(b)(1), unless the authorization is terminated or revoked.  Performed at [facility], [address] [place], [place] [address]      Total time spend on discharging this patient, including the last patient exam, discussing the hospital stay, instructions for ongoing care as it relates to all pertinent caregivers, as well as preparing the medical discharge records, prescriptions, and/or referrals as applicable, is 50 minutes.    Ellouise Haber, MD  [facility] 05/09/2024, 12:23 PM

## 2024-05-09 NOTE — ED Notes (Signed)
 This RN assumed care of this PT at this time

## 2024-05-09 NOTE — Progress Notes (Signed)
 Mobility Specialist - Progress Note  Pre-mobility: HR-72,SpO2-99%  During mobility: HR-85,  SpO2-86% recovered to 95% in < 30 sec  Post-mobility: HR-122,  SPO2-%   05/09/24 1100  Mobility  Activity Ambulated with assistance;Stood at bedside  Level of Assistance Standby assist, set-up cues, supervision of patient - no hands on  Assistive Device None  Distance Ambulated (ft) 120 ft  Range of Motion/Exercises All extremities  Activity Response Tolerated well  Mobility visit 1 Mobility  Mobility Specialist Start Time (ACUTE ONLY) 1103  Mobility Specialist Stop Time (ACUTE ONLY) 1126  Mobility Specialist Time Calculation (min) (ACUTE ONLY) 23 min   Pt was in chair in the room with guest on RA upon entry. Pt agreed to mobility. Pt O2 vitals were taken throughout activity as a precaution. Pt is able today to STS independently with 2 WW. Pt ambulated well. Pt did utilize recovery break to check vitals. Pt did Desat, but recovered using breathing techniques. Pt remained above 95% O2 throughout remainder of mobility. After activity pt returned to room in chair with guest and needs within reach.  Clem Rodes Mobility Specialist 05/09/24, 11:39 AM

## 2024-05-09 NOTE — Progress Notes (Signed)
 Occupational Therapy Treatment Patient Details Name: Roberto [name] MRN: [id number] DOB: 1956-01-22 Today's Date: 05/09/2024   History of present illness Pt is a 68 y.o. male here with altered mental status, encephalopathy, acute on chronic HFrEF, acute respiratory failure with hypoxia. Seen in ED 10/31 after a fall. Anticipate ICU admit for low BP. PMH of mechanical heart valve on anticoagulation, heart failure, type II diabetic, CKD, liver disease, hypertension, dyslipidemia, CVA/TIA, chronic HFrEF, s/p ICD.   OT comments  Pt up in chair upon OT arrival.  Pt on room air throughout session with good tolerance to activity, including toilet transfer and functional mobility within hallway.  Pt requiring intermittent CGA for balance d/t R knee instability, but does self correct with BUEs on walker.  OT reviewed fall prevention strategies and EC with pt and spouse.  Reinforcement of pacing strategies and frequent rest breaks to increase tolerance to ADLs.  Pt left in chair with spouse present and RN present to administer meds.      If plan is discharge home, recommend the following:  A little help with walking and/or transfers;A little help with bathing/dressing/bathroom;Help with stairs or ramp for entrance   Equipment Recommendations  None recommended by OT    Recommendations for Other Services      Precautions / Restrictions Precautions Precautions: Fall Recall of Precautions/Restrictions: Intact Restrictions Weight Bearing Restrictions Per Provider Order: No       Mobility Bed Mobility               General bed mobility comments: NT; pt received and left in chair end of session Patient Response: Cooperative  Transfers Overall transfer level: Needs assistance Equipment used: Rolling walker (2 wheels) Transfers: Sit to/from Stand Sit to Stand: Supervision           General transfer comment: vc for hand placement in prep for STS     Balance Overall balance  assessment: Needs assistance Sitting-balance support: Feet supported, Single extremity supported Sitting balance-Leahy Scale: Good     Standing balance support: Bilateral upper extremity supported, During functional activity, Reliant on assistive device for balance Standing balance-Leahy Scale: Fair Standing balance comment: Pt reliant on RW for balance; R knee instability with pt tending to lose balance to the R in standing, though able to self correct wiht RW/CGA                           ADL either performed or assessed with clinical judgement   ADL Overall ADL's : Needs assistance/impaired                         Toilet Transfer: Supervision/safety;Grab bars;Rolling walker (2 wheels)           Functional mobility during ADLs: Supervision/safety;Contact guard assist;Rolling walker (2 wheels) General ADL Comments: Functional mobility around nurses' station/hallway x5 min, room air, RW and intermittent CGA-supv.  Pt requiring intermittent CGA d/t occasional minor LOB with shuffle to the R (chronic R knee instability, per chart) and requiring vc to keep feet within back 2 legs/middle of walker.    Extremity/Trunk Assessment Upper Extremity Assessment Upper Extremity Assessment: Overall WFL for tasks assessed   Lower Extremity Assessment Lower Extremity Assessment: Generalized weakness   Cervical / Trunk Assessment Cervical / Trunk Assessment: Normal    Vision       Perception     Praxis     Communication Communication Factors Affecting Communication: Hearing  impaired   Cognition Arousal: Alert Behavior During Therapy: WFL for tasks assessed/performed Cognition: No apparent impairments                               Following commands: Intact        Cueing   Cueing Techniques: Verbal cues  Exercises Other Exercises Other Exercises: Educ provided on EC strategies during shower (recommendation to use shower chair) and morning ADLs  (bathing/dressing/grooming).    Shoulder Instructions       General Comments Multiple attempts to check 02 sats throughout session, but unable to get accurate readings.  RN verified sats had been 94 this morning on room air.  Observed to be asymptomatic of any 02 desaturation at rest and with activity throughout session; pt acknowledged feeling good without SOB.    Pertinent Vitals/ Pain       Pain Assessment Pain Assessment: No/denies pain  Home Living                                          Prior Functioning/Environment              Frequency  Min 1X/week        Progress Toward Goals  OT Goals(current goals can now be found in the care plan section)  Progress towards OT goals: Progressing toward goals  Acute Rehab OT Goals Patient Stated Goal: to go home OT Goal Formulation: With patient/family Time For Goal Achievement: 05/19/24 Potential to Achieve Goals: Good  Plan      Co-evaluation                 AM-PAC OT 6 Clicks Daily Activity     Outcome Measure   Help from another person eating meals?: None Help from another person taking care of personal grooming?: None Help from another person toileting, which includes using toliet, bedpan, or urinal?: A Little Help from another person bathing (including washing, rinsing, drying)?: A Little Help from another person to put on and taking off regular upper body clothing?: None Help from another person to put on and taking off regular lower body clothing?: A Little 6 Click Score: 21    End of Session Equipment Utilized During Treatment: Gait belt;Rolling walker (2 wheels)  OT Visit Diagnosis: Other abnormalities of gait and mobility (R26.89)   Activity Tolerance Patient tolerated treatment well   Patient Left in chair;with nursing/sitter in room   Nurse Communication Mobility status        Time: 0902-0920 OT Time Calculation (min): 18 min  Charges: OT General Charges $OT  Visit: 1 Visit OT Treatments $Self Care/Home Management : 8-22 mins  Inocente Blazing, MS, OTR/L   Inocente MARLA Blazing 05/09/2024, 10:33 AM

## 2024-05-09 NOTE — ED Notes (Signed)
 Unable to locate [employer] pacemaker interrogator

## 2024-05-09 NOTE — ED Provider Notes (Incomplete)
 [place] [facility] Provider Note   CSN: [id number] Arrival date & time: 05/09/24  1804     History {Add pertinent medical, surgical, social history, OB history to HPI:1} Chief Complaint  Patient presents with   defribrillator fired    Guillermo Nehring is a 68 y.o. male with HFrEF complicated by VT s/p ICD placement and subsequent upgrade to CRT-D, persistent Afib, chronic hypotension on midodrine , dyslipidemia, bicuspid aortic valve status post mechanical valve replacement on Lovenox ,, CVA/TIA, COPD, CKD stage IIIb, who presents with syncope and concern for defibrillation from his internal defib. The patient was discharged earlier today after an admission to [facility] for encephalopathy, gen weakness, multiple falls, elevated LFTs, acute on chronic HFrEF, and hypoxic resp failure; he was diuresed and haed neg CTH. He and his girlfriend of 20 years went to get married this afternoon and during the ceremony the patient became lightheaded/dizzy and lost consciousness. He was lowered to the ground and his whole body convulsed once like he was shocked by his ICD. Girlfriend gave him rescue breaths as well. After some time the patient came to and had nausea/vomiting. He states his dizziness has resolved. He denies any chest pain, shortness of breath, cough, congestion, f/c, leg swelling. He has chronic pain to his right knee which gives out on him frequently.     Past Medical History:  Diagnosis Date   Aneurysm    Bicuspid aortic valve    a. s/p #27 [employer] mechanical valve on 03/25/2010; b. on Coumadin ; c. TTE 12/17: EF 40-45%, moderately dilated LV with moderate LVH, AVR well-seated with 14 mmHg gradient, peak AV velocity 2.5 m/s, mild mitral valve thickening with mild MR, mildly dilated RV with mildly reduced contraction   Cellulitis    CHF (congestive heart failure) (HCC)    Chronic kidney disease    Chronic systolic CHF (congestive heart failure) (HCC)     a. R/[facility]HC 03/2010 showed no significant CAD, LVEDP 31 mmHg, mean AoV gradient 34 mmHg at rest and 47 mmHg with dobutamine 20 mcg/kg/min, AVA 1.0 cm^2, RA 31, RV 68/25, PA 68/47, PCWP 38. PA sat 65%. CO 6.2 L/min (Fick) and 5.3 L/min (thermodilution)   Clotting disorder    COPD (chronic obstructive pulmonary disease) (HCC)    H/O mechanical aortic valve replacement 03/25/2010   a. 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   Stage 3 chronic kidney disease (HCC)    Stroke [facility])    TIA (transient ischemic attack) 05/2014       Home Medications Prior to Admission medications   Medication Sig Start Date End Date Taking? Authorizing Provider  albuterol  (PROVENTIL ) (2.5 MG/3ML) 0.083% nebulizer solution USE 1 VIAL IN NEBULIZER EVERY 6 HOURS AS NEEDED FOR WHEEZING FOR SHORTNESS OF BREATH 09/25/23   [name], [name], DO  allopurinol  (ZYLOPRIM ) 100 MG tablet Take 1 tablet (100 mg total) by mouth daily. 03/10/24   [name], MD  amiodarone  (PACERONE ) 200 MG tablet Take 2 tablets by mouth twice daily 03/25/24   End, [name], MD  baclofen  (LIORESAL ) 10 MG tablet Take 1 tablet (10 mg total) by mouth 2 (two) times daily. Take before food twice a day, stop if not helping 04/25/24 06/24/24  [name], [name]  buPROPion  (WELLBUTRIN  XL) 300 MG 24 hr tablet Take 300 mg  by mouth daily. 08/16/23   [provider]  dapagliflozin  propanediol (FARXIGA ) 10 MG TABS tablet Take 1 tablet (10 mg total) by mouth daily before breakfast. 03/19/24   End, [name], MD  enoxaparin  (LOVENOX ) 120 MG/0.8ML injection INJECT 0.72 ML (108MG ) INTO THE SKIN EVERY 12 HOURS 03/27/24   [name], [name], DO  feeding supplement (ENSURE PLUS HIGH PROTEIN) LIQD Take 237 mLs by mouth 2 (two) times daily between meals. 05/09/24   [name], MD  fenofibrate  (TRICOR ) 48 MG tablet  Take 48 mg by mouth daily. 03/05/24   [provider]  Fluticasone -Umeclidin-Vilant (TRELEGY ELLIPTA ) 200-62.5-25 MCG/ACT AEPB Inhale 1 puff into the lungs daily in the afternoon. 02/22/24   [name], MD  metoprolol  succinate (TOPROL -XL) 25 MG 24 hr tablet Take 0.5 tablets (12.5 mg total) by mouth daily. Reduced from 50 mg daily. 05/09/24   [name], MD  mexiletine (MEXITIL ) 150 MG capsule Take 150 mg by mouth 3 (three) times daily. 02/07/24   [provider]  midodrine  (PROAMATINE ) 10 MG tablet Take 1 tablet (10 mg total) by mouth 3 (three) times daily with meals. Increased from 2.5 mg. 05/09/24   [name], MD  nortriptyline  (PAMELOR ) 10 MG capsule Take 1 capsule by mouth at bedtime 04/08/24   [name], [name], DO  Omega-3 Fatty Acids (OMEGA 3 PO) Take 1,500 mg by mouth 2 (two) times daily.    [provider]  ondansetron  (ZOFRAN -ODT) 4 MG disintegrating tablet Take 1 tablet (4 mg total) by mouth every 8 (eight) hours as needed for nausea or vomiting (before eating). 04/25/24 04/20/25  [name], [name]  pantoprazole  (PROTONIX ) 40 MG tablet Take 1 tablet (40 mg total) by mouth 2 (two) times daily. Then go back to once a day Patient taking differently: Take 40 mg by mouth 2 (two) times daily. 04/25/24   [name], [name]  potassium chloride  (MICRO-K ) 10 MEQ CR capsule Take 1 capsule (10 mEq total) by mouth daily. 04/24/24   [name], [name], DO  rosuvastatin  (CRESTOR ) 40 MG tablet Take 1 tablet (40 mg total) by mouth daily. 04/06/23   [name], MD  Spacer/Aero-Holding [facility] Use as directed Dx: COPD, J44.9 07/13/23   Vicci Bouchard P, DO  spironolactone  (ALDACTONE ) 25 MG tablet Take 0.5 tablets (12.5 mg total) by mouth daily. 05/09/24   [name], MD  torsemide  (DEMADEX ) 20 MG tablet Take 1 tablet (20 mg total) by mouth daily. 03/21/24   Rolan Ezra RAMAN, MD  traZODone  (DESYREL ) 50 MG tablet Take 0.5 tablets (25 mg total) by mouth at bedtime as  needed for sleep. 03/17/24   [name], [name], DO  VENTOLIN  HFA 108 (90 Base) MCG/ACT inhaler INHALE 2 PUFFS BY MOUTH EVERY 6 HOURS AS NEEDED FOR WHEEZING OR SHORTNESS OF BREATH 05/16/23   [name], [name], DO  warfarin (COUMADIN ) 1 MG tablet Take 1 mg by mouth daily.    [provider]  warfarin (COUMADIN ) 3 MG tablet Take 3 mg by mouth daily.    [provider]      Allergies    Latex and Nicotine     Review of Systems   Review of Systems A 10 point review of systems was performed and is negative unless otherwise reported in HPI.  Physical Exam Updated Vital Signs BP 96/71   Pulse 78   Temp 99.1 F (37.3 C)   Resp 18   Ht 5' 6 (1.676 m)   Wt 99.6 kg  SpO2 93%   BMI 35.44 kg/m  Physical Exam General: Normal appearing {Desc; male/male:[id number]}, lying in bed.  HEENT: PERRLA, Sclera anicteric, MMM, trachea midline.  Cardiology: RRR, no murmurs/rubs/gallops. BL radial and DP pulses equal bilaterally.  Resp: Normal respiratory rate and effort. CTAB, no wheezes, rhonchi, crackles.  Abd: Soft, non-tender, non-distended. No rebound tenderness or guarding.  GU: Deferred. MSK: No peripheral edema or signs of trauma. Extremities without deformity or TTP. No cyanosis or clubbing. Skin: warm, dry. No rashes or lesions. Back: No CVA tenderness Neuro: A&Ox4, CNs II-XII grossly intact. MAEs. Sensation grossly intact.  Psych: Normal mood and affect.   ED Results / Procedures / Treatments   Labs (all labs ordered are listed, but only abnormal results are displayed) Labs Reviewed  CBC WITH DIFFERENTIAL/PLATELET - Abnormal; Notable for the following components:      Result Value   RBC 4.16 (*)    Hemoglobin 12.2 (*)    HCT 38.0 (*)    RDW 17.7 (*)    Lymphs Abs 0.6 (*)    All other components within normal limits  COMPREHENSIVE METABOLIC PANEL WITH GFR - Abnormal; Notable for the following components:   CO2 20 (*)    Glucose, Bld 125 (*)    Creatinine, Ser 1.85 (*)     Total Protein 6.3 (*)    Albumin 3.1 (*)    AST 47 (*)    ALT 121 (*)    GFR, Estimated 39 (*)    Anion gap 17 (*)    All other components within normal limits  URINALYSIS, ROUTINE W REFLEX MICROSCOPIC - Abnormal; Notable for the following components:   Glucose, UA >=500 (*)    Protein, ur 30 (*)    All other components within normal limits  PROTIME-INR - Abnormal; Notable for the following components:   Prothrombin Time 25.9 (*)    INR 2.2 (*)    All other components within normal limits  BRAIN NATRIURETIC PEPTIDE - Abnormal; Notable for the following components:   B Natriuretic Peptide 749.4 (*)    All other components within normal limits  TROPONIN I (HIGH SENSITIVITY) - Abnormal; Notable for the following components:   Troponin I (High Sensitivity) 47 (*)    All other components within normal limits  TROPONIN I (HIGH SENSITIVITY) - Abnormal; Notable for the following components:   Troponin I (High Sensitivity) 55 (*)    All other components within normal limits  MAGNESIUM   PROTIME-INR  CBC    EKG None  Radiology DG Knee Right Port Result Date: 05/09/2024 EXAM: 1 or 2 VIEW(S) XRAY OF THE KNEE 05/09/2024 08:44:00 PM COMPARISON: 03/26/2024 CLINICAL HISTORY: recent fall FINDINGS: BONES AND JOINTS: No acute fracture. No focal osseous lesion. No joint dislocation. No significant joint effusion. No significant degenerative changes. SOFT TISSUES: Femoropopliteal vascular calcifications noted. IMPRESSION: 1. No acute fracture or dislocation. Electronically signed by: Norman Gatlin MD 05/09/2024 08:49 PM EST RP Workstation: [id number]   DG Chest Portable 1 View Result Date: 05/09/2024 EXAM: 1 VIEW(S) XRAY OF THE CHEST 05/09/2024 08:43:00 PM COMPARISON: 05/05/2024 CLINICAL HISTORY: defib FINDINGS: LINES, TUBES AND DEVICES: Left chest wall cardiac device stable in position. LUNGS AND PLEURA: No focal pulmonary opacity. No pulmonary edema. No pleural effusion. No pneumothorax. HEART  AND MEDIASTINUM: Persistent cardiomegaly. Aortic calcification. BONES AND SOFT TISSUES: Status post median sternotomy. No acute osseous abnormality. IMPRESSION: 1. Persistent cardiomegaly. Electronically signed by: Norman Gatlin MD 05/09/2024 08:48 PM EST RP Workstation: [id number]    Procedures Procedures  {  Document cardiac monitor, telemetry assessment procedure when appropriate:1}  Medications Ordered in ED Medications  acetaminophen  (TYLENOL ) tablet 1,000 mg (1,000 mg Oral Given 05/09/24 2316)  oxyCODONE  (Oxy IR/ROXICODONE ) immediate release tablet 5 mg (5 mg Oral Given 05/09/24 2316)    ED Course/ Medical Decision Making/ A&P                          Medical Decision Making Amount and/or Complexity of Data Reviewed Labs: ordered. Radiology: ordered. Decision-making details documented in ED Course.  Risk OTC drugs. Prescription drug management. Decision regarding hospitalization.    This patient presents to the ED for concern of ***, this involves an extensive number of treatment options, and is a complaint that carries with it a high risk of complications and morbidity.  I considered the following differential and admission for this acute, potentially life threatening condition.   MDM:    DDX for syncope includes but is not limited to:  Consider anemia and electrolyte abnormalities as possible etiologies. Consider arrhythmias and ACS, although without associated symptoms, less likely. Consider hemorrhage vs CVA, although neuro intact now for 24 hours and no associated other symptoms. ***  Given history, exam and workup, low suspicion for HF, ICH (no trauma, headache), seizure (no witnessed seizure like activity, no postictal period, tongue laceration, bladder incontinence), stroke (no focal neuro deficits), HOCM (no murmur, family history of sudden death), ACS (neg troponin, no anginal pain), aortic dissection (no chest pain), malignant arrhythmia on ekg or any family history  of sudden death, or GI bleed (stable hgb). Low suspicion for PE given normal vital signs, absence of chest pain or dyspnea, no evidence of DVT, no recent surgery/immobilization.    Clinical Course as of 05/10/24 1904  Fri May 09, 2024  2110 DG Chest Portable 1 View 1. Persistent cardiomegaly. [HN]  2111 DG Knee Right Port 1. No acute fracture or dislocation. [HN]  2145 D/w cardiology who will come to see the patient [HN]  2307 Repaging cardiology for recs [HN]  2331 D/w cardiology who recommends interrogation of device, repeat trop, and medicine admission. [HN]    Clinical Course User Index [HN] Franklyn Sid SAILOR, MD    Labs: I Ordered, and personally interpreted labs.  The pertinent results include:  those listed above  Imaging Studies ordered: I ordered imaging studies including CXR, R knee XR I independently visualized and interpreted imaging. I agree with the radiologist interpretation  Additional history obtained from chart review, girlfriend at bedside.    Cardiac Monitoring: The patient was maintained on a cardiac monitor.  I personally viewed and interpreted the cardiac monitored which showed an underlying rhythm of: paced rhythm  Reevaluation: After the interventions noted above, I reevaluated the patient and found that they have :stayed the same  Social Determinants of Health: Lives independently  Disposition:  Admit to hospitalist w/ cards following  Co morbidities that complicate the patient evaluation  Past Medical History:  Diagnosis Date   Aneurysm    Bicuspid aortic valve    a. s/p #27 [employer] mechanical valve on 03/25/2010; b. on Coumadin ; c. TTE 12/17: EF 40-45%, moderately dilated LV with moderate LVH, AVR well-seated with 14 mmHg gradient, peak AV velocity 2.5 m/s, mild mitral valve thickening with mild MR, mildly dilated RV with mildly reduced contraction   Cellulitis    CHF (congestive heart failure) (HCC)    Chronic kidney disease    Chronic  systolic CHF (congestive heart failure) (HCC)  a. [facility] 03/2010 showed no significant CAD, LVEDP 31 mmHg, mean AoV gradient 34 mmHg at rest and 47 mmHg with dobutamine 20 mcg/kg/min, AVA 1.0 cm^2, RA 31, RV 68/25, PA 68/47, PCWP 38. PA sat 65%. CO 6.2 L/min (Fick) and 5.3 L/min (thermodilution)   Clotting disorder    COPD (chronic obstructive pulmonary disease) (HCC)    H/O mechanical aortic valve replacement 03/25/2010   a. 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   Stage 3 chronic kidney disease (HCC)    Stroke (HCC)    TIA (transient ischemic attack) 05/2014     Medicines No orders of the defined types were placed in this encounter.   I have reviewed the patients home medicines and have made adjustments as needed  Problem List / ED Course: Problem List Items Addressed This Visit   None Visit Diagnoses       Syncope and collapse    -  Primary            {Document critical care time when appropriate:1} {Document review of labs and clinical decision tools ie heart score, Chads2Vasc2 etc:1}  {Document your independent review of radiology images, and any outside records:1} {Document your discussion with family members, caretakers, and with consultants:1} {Document social determinants of health affecting pt's care:1} {Document your decision making why or why not admission, treatments were needed:1}  This note was created using dictation software, which may contain spelling or grammatical errors.

## 2024-05-09 NOTE — Plan of Care (Signed)

## 2024-05-09 NOTE — Consult Note (Signed)
 Cardiology Consultation   Patient ID: Roberto [name] MRN: [id number]; DOB: 1955-11-23  Admit date: 05/09/2024 Date of Consult: 05/09/2024  PCP:  [name], DO   [facility] Providers Cardiologist:  [name], MD  Electrophysiologist:  Danelle Birmingham, MD  Sleep Medicine:  Wilbert Bihari, MD       Patient Profile: Roberto [name] is a 68 y.o. male with a hx of mechanical aortic valve replacement, non-obstructive coronary artery disease, thoracic aortic aneurysm (followed by [facility]CTS), stroke, right renal infarct, chronic systolic heart failure (LVEF 20% by cardiac MRI in 06/2023) complicated by VT s/p ICD and subsequent upgrade to CRT-D, persistent atrial fibrillation, hyperlipidemia, obstructive sleep apnea not using CPAP, and morbid obesity  who is being seen 05/09/2024 for the evaluation of fall/syncope at the request of Dr Franklyn.  History of Present Illness: Roberto [name] is a 68 y.o. male with a hx of mechanical aortic valve replacement, non-obstructive coronary artery disease, thoracic aortic aneurysm (followed by [facility]), stroke, right renal infarct, chronic systolic heart failure (LVEF 20% by cardiac MRI in 06/2023) complicated by VT s/p ICD and subsequent upgrade to CRT-D, persistent atrial fibrillation, hyperlipidemia, obstructive sleep apnea not using CPAP, and morbid obesity  who is being seen 05/09/2024 for the evaluation of fall/syncope    Please note patient has had multiple hospitalization for fall, dizziness, recently admitted at [facility], discharged today morning, now comes back in the evening with fall, syncope,.  Wife/significant other report they were getting married and that is why he got out of the hospital, as he was walking with a walker suddenly fell down, was having G3 movements, shaking, then all of a sudden he became normal and woke up.  Recent admission on 02/2024 for wide-complex tachycardia s/p BiV pacemaker, was hypotension intolerant to  GDMT. Subsequent admission to [facility] 9/25 for poor oral intake, multiple recent admissions dizziness and falls  Currently patient's blood pressure is soft, he is hypoxic at 76/88 on room air, telemetry shows V-paced rhythm. EKG shows V paced rhythm. He denies any chest pain reports he has no shortness of breath Laboratory investigation shows BNP 749 which is slightly higher than 400 before,. Troponin is low 47. Creatinine is 1.85 baseline creatinine is around 1.6. INR is 2.2. Glucose 125. Chest x-ray shows persistent cardiomegaly. Right knee no fracture.  His cardiac workup as below Relevant CV Studies:   03/2024 RHC 1. Normal PCWP 2. Mild mixed pulmonary arterial/pulmonary venous hypertension 3. Predominant RV failure with prominent V waves in the RA tracing suggestive of significant tricuspid regurgitation, mean RA pressure 13.  PAPi is low.  4. CO is low but not markedly low.    09/2023 Echo complete 1. Left ventricular ejection fraction, by estimation, is 25 to 30%. Left  ventricular ejection fraction by 3D volume is 28 %. The left ventricle has  severely decreased function. The left ventricle demonstrates global  hypokinesis. The left ventricular  internal cavity size was severely dilated. Left ventricular diastolic  parameters are consistent with Grade II diastolic dysfunction  (pseudonormalization). The average left ventricular global longitudinal  strain is -10.5 %. The global longitudinal strain  is abnormal.   2. Right ventricular systolic function is mildly reduced. The right  ventricular size is normal.   3. Left atrial size was mild to moderately dilated.   4. Right atrial size was moderately dilated.   5. The mitral valve is degenerative. Mild to moderate mitral valve  regurgitation. No evidence of mitral stenosis.  6. Tricuspid valve regurgitation is mild to moderate.   7. The aortic valve has been repaired/replaced. Aortic valve  regurgitation versus  perivalvular leak is trivial. Mild to moderate aortic  valve stenosis with at least some component of patient-prosthesis  mismatch. Aortic valve mean gradient measures 12.6  mmHg.   8. There is mild dilatation of the aortic root, measuring 43 mm. Known  severe dilation of the ascending aorta is not well-visualized on this  examination.   Past Medical History:  Diagnosis Date   Aneurysm    Bicuspid aortic valve    a. s/p #27 Carbomedics mechanical valve on 03/25/2010; b. on Coumadin ; c. TTE 12/17: EF 40-45%, moderately dilated LV with moderate LVH, AVR well-seated with 14 mmHg gradient, peak AV velocity 2.5 m/s, mild mitral valve thickening with mild MR, mildly dilated RV with mildly reduced contraction   Cellulitis    CHF (congestive heart failure) (HCC)    Chronic kidney disease    Chronic systolic CHF (congestive heart failure) (HCC)    a. R/LHC 03/2010 showed no significant CAD, LVEDP 31 mmHg, mean AoV gradient 34 mmHg at rest and 47 mmHg with dobutamine 20 mcg/kg/min, AVA 1.0 cm^2, RA 31, RV 68/25, PA 68/47, PCWP 38. PA sat 65%. CO 6.2 L/min (Fick) and 5.3 L/min (thermodilution)   Clotting disorder    COPD (chronic obstructive pulmonary disease) (HCC)    H/O mechanical aortic valve replacement 03/25/2010   a. 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   Stage 3 chronic kidney disease (HCC)    Stroke [facility])    TIA (transient ischemic attack) 05/2014    Past Surgical History:  Procedure Laterality Date   AORTIC VALVE REPLACEMENT     AORTIC VALVE SURGERY     CARDIAC CATHETERIZATION  03/21/2010   No significant CAD. Severe aortic stenosis. Severely elevated left and right heart filling pressures.   CARDIAC SURGERY  2009   CHF   CARDIOVERSION N/A 08/28/2023   Procedure: CARDIOVERSION;  Surgeon: [name] Slain, MD;  Location: [facility]  INVASIVE CV LAB;  Service: Cardiovascular;  Laterality: N/A;   CARPAL TUNNEL RELEASE Left 2005   COLONOSCOPY N/A 12/13/2023   Procedure: COLONOSCOPY;  Surgeon: Suzann Inocente HERO, MD;  Location: [facility] ENDOSCOPY;  Service: Gastroenterology;  Laterality: N/A;   ICD IMPLANT N/A 06/25/2023   Procedure: ICD IMPLANT;  Surgeon: Waddell Danelle ORN, MD;  Location: [name] INVASIVE CV LAB;  Service: Cardiovascular;  Laterality: N/A;   PACEMAKER IMPLANT     POLYPECTOMY  12/13/2023   Procedure: POLYPECTOMY, INTESTINE;  Surgeon: Suzann Inocente HERO, MD;  Location: [facility] ENDOSCOPY;  Service: Gastroenterology;;   RIGHT HEART CATH Right 03/21/2024   Procedure: RIGHT HEART CATH;  Surgeon: Rolan Ezra RAMAN, MD;  Location: [facility] INVASIVE CV LAB;  Service: Cardiovascular;  Laterality: Right;   RIGHT HEART CATH AND CORONARY ANGIOGRAPHY N/A 01/28/2019   Procedure: RIGHT HEART CATH AND CORONARY ANGIOGRAPHY;  Surgeon: [name], MD;  Location: [facility] INVASIVE CV LAB;  Service: Cardiovascular;  Laterality: N/A;   RIGHT HEART CATH AND CORONARY ANGIOGRAPHY N/A 06/20/2023   Procedure: RIGHT HEART CATH AND CORONARY ANGIOGRAPHY;  Surgeon: [name], MD;  Location: [facility] INVASIVE CV LAB;  Service: Cardiovascular;  Laterality: N/A;   TONSILLECTOMY  1962     Home Medications:  Prior to Admission medications   Medication Sig  Start Date End Date Taking? Authorizing Provider  albuterol  (PROVENTIL ) (2.5 MG/3ML) 0.083% nebulizer solution USE 1 VIAL IN NEBULIZER EVERY 6 HOURS AS NEEDED FOR WHEEZING FOR SHORTNESS OF BREATH 09/25/23   [name], [name], DO  allopurinol  (ZYLOPRIM ) 100 MG tablet Take 1 tablet (100 mg total) by mouth daily. 03/10/24   [name], MD  amiodarone  (PACERONE ) 200 MG tablet Take 2 tablets by mouth twice daily 03/25/24   End, [name], MD  baclofen  (LIORESAL ) 10 MG tablet Take 1 tablet (10 mg total) by mouth 2 (two) times daily. Take before food twice a day, stop if not helping 04/25/24 06/24/24  [name],  [name]  buPROPion  (WELLBUTRIN  XL) 300 MG 24 hr tablet Take 300 mg by mouth daily. 08/16/23   [provider]  dapagliflozin  propanediol (FARXIGA ) 10 MG TABS tablet Take 1 tablet (10 mg total) by mouth daily before breakfast. 03/19/24   End, [name], MD  enoxaparin  (LOVENOX ) 120 MG/0.8ML injection INJECT 0.72 ML (108MG ) INTO THE SKIN EVERY 12 HOURS 03/27/24   [name], [name], DO  feeding supplement (ENSURE PLUS HIGH PROTEIN) LIQD Take 237 mLs by mouth 2 (two) times daily between meals. 05/09/24   [name], MD  fenofibrate  (TRICOR ) 48 MG tablet Take 48 mg by mouth daily. 03/05/24   [provider]  Fluticasone -Umeclidin-Vilant (TRELEGY ELLIPTA ) 200-62.5-25 MCG/ACT AEPB Inhale 1 puff into the lungs daily in the afternoon. 02/22/24   [name], MD  metoprolol  succinate (TOPROL -XL) 25 MG 24 hr tablet Take 0.5 tablets (12.5 mg total) by mouth daily. Reduced from 50 mg daily. 05/09/24   [name], MD  mexiletine (MEXITIL ) 150 MG capsule Take 150 mg by mouth 3 (three) times daily. 02/07/24   [provider]  midodrine  (PROAMATINE ) 10 MG tablet Take 1 tablet (10 mg total) by mouth 3 (three) times daily with meals. Increased from 2.5 mg. 05/09/24   [name], MD  nortriptyline  (PAMELOR ) 10 MG capsule Take 1 capsule by mouth at bedtime 04/08/24   [name], [name], DO  Omega-3 Fatty Acids (OMEGA 3 PO) Take 1,500 mg by mouth 2 (two) times daily.    [provider]  ondansetron  (ZOFRAN -ODT) 4 MG disintegrating tablet Take 1 tablet (4 mg total) by mouth every 8 (eight) hours as needed for nausea or vomiting (before eating). 04/25/24 04/20/25  [name], [name]  pantoprazole  (PROTONIX ) 40 MG tablet Take 1 tablet (40 mg total) by mouth 2 (two) times daily. Then go back to once a day Patient taking differently: Take 40 mg by mouth 2 (two) times daily. 04/25/24   [name], [name]  potassium chloride  (MICRO-K ) 10 MEQ CR capsule Take 1 capsule (10 mEq total) by mouth  daily. 04/24/24   [name], [name], DO  rosuvastatin  (CRESTOR ) 40 MG tablet Take 1 tablet (40 mg total) by mouth daily. 04/06/23   [name], MD  Spacer/Aero-Holding [facility] Use as directed Dx: COPD, J44.9 07/13/23   Vicci Bouchard P, DO  spironolactone  (ALDACTONE ) 25 MG tablet Take 0.5 tablets (12.5 mg total) by mouth daily. 05/09/24   [name], MD  torsemide  (DEMADEX ) 20 MG tablet Take 1 tablet (20 mg total) by mouth daily. 03/21/24   Rolan Ezra RAMAN, MD  traZODone  (DESYREL ) 50 MG tablet Take 0.5 tablets (25 mg total) by mouth at bedtime as needed for sleep. 03/17/24   [name], [name], DO  VENTOLIN  HFA 108 (90 Base) MCG/ACT inhaler INHALE 2 PUFFS BY MOUTH EVERY 6 HOURS  AS NEEDED FOR WHEEZING OR SHORTNESS OF BREATH 05/16/23   Vicci Bouchard P, DO  warfarin (COUMADIN ) 1 MG tablet Take 1 mg by mouth daily.    [provider]  warfarin (COUMADIN ) 3 MG tablet Take 3 mg by mouth daily.    [provider]    Scheduled Meds:  Continuous Infusions:  PRN Meds:   Allergies:    Allergies  Allergen Reactions   Latex Hives   Nicotine  Hives and Rash    Patches caused localized rash and hives     Social History:   Social History   Socioeconomic History   Marital status: Divorced    Spouse name: Not on file   Number of children: 1   Years of education: 14   Highest education level: Associate degree: occupational, scientist, product/process development, or vocational program  Occupational History   Occupation: Disabled    Employer: UNEMPLOYED  Tobacco Use   Smoking status: Every Day    Current packs/day: 0.20    Average packs/day: 0.5 packs/day for 48.4 years (23.8 ttl pk-yrs)    Types: Cigarettes    Start date: 1974    Last attempt to quit: 2021   Smokeless tobacco: Never   Tobacco comments:    Caregiver reports patient currently smokes approximately 2 cigarettes/day.  Vaping Use   Vaping status: Never Used  Substance and Sexual Activity   Alcohol use: Never   Drug use: Never    Sexual activity: Not Currently  Other Topics Concern   Not on file  Social History Narrative   Not on file   Social Drivers of Health   Financial Resource Strain: High Risk (04/21/2024)   Overall Financial Resource Strain (CARDIA)    Difficulty of Paying Living Expenses: Hard  Food Insecurity: Food Insecurity Present (05/05/2024)   Hunger Vital Sign    Worried About Running Out of Food in the Last Year: Never true    Ran Out of Food in the Last Year: Sometimes true  Transportation Needs: No Transportation Needs (05/05/2024)   PRAPARE - Administrator, Civil Service (Medical): No    Lack of Transportation (Non-Medical): No  Recent Concern: Transportation Needs - Unmet Transportation Needs (05/01/2024)   PRAPARE - Administrator, Civil Service (Medical): Yes    Lack of Transportation (Non-Medical): No  Physical Activity: Inactive (04/21/2024)   Exercise Vital Sign    Days of Exercise per Week: 0 days    Minutes of Exercise per Session: Not on file  Stress: Stress Concern Present (04/21/2024)   [employer] of Occupational Health - Occupational Stress Questionnaire    Feeling of Stress: Very much  Social Connections: Socially Isolated (05/05/2024)   Social Connection and Isolation Panel    Frequency of Communication with Friends and Family: Never    Frequency of Social Gatherings with Friends and Family: Never    Attends Religious Services: Never    Database Administrator or Organizations: No    Attends Banker Meetings: Never    Marital Status: Living with partner  Intimate Partner Violence: Not At Risk (05/05/2024)   Humiliation, Afraid, Rape, and Kick questionnaire    Fear of Current or Ex-Partner: No    Emotionally Abused: No    Physically Abused: No    Sexually Abused: No    Family History:    Family History  Problem Relation Age of Onset   Arthritis Mother    Dementia Mother    Colon cancer Mother  Arthritis Father     Diabetes Father    Stroke Father    Colon cancer Father    Heart attack Brother    Breast cancer Sister    Seizures Sister    Cancer Brother        brain   Heart disease Brother    Heart attack Brother      ROS:  Please see the history of present illness.   All other ROS reviewed and negative.     Physical Exam/Data: Vitals:   05/09/24 2215 05/09/24 2230 05/09/24 2315 05/09/24 2330  BP: 107/85  99/73 102/75  Pulse:   (!) 40 (!) 103  Resp:   16 (!) 21  Temp:  98 F (36.7 C)    SpO2:   (!) 83% (!) 76%  Weight:      Height:       No intake or output data in the 24 hours ending 05/09/24 2355    05/09/2024    6:24 PM 05/09/2024    5:00 AM 05/08/2024    4:37 AM  Last 3 Weights  Weight (lbs) 219 lb 9.3 oz 219 lb 9.3 oz 221 lb 9 oz  Weight (kg) 99.6 kg 99.6 kg 100.5 kg     Body mass index is 35.44 kg/m.  General:  Well nourished, well developed, in no acute distress HEENT: normal Neck: no JVD Vascular: No carotid bruits; Distal pulses 2+ bilaterally Cardiac:  normal S1, S2; RRR; no murmur  Lungs:  clear to auscultation bilaterally, no wheezing, rhonchi or rales  Abd: soft, nontender, no hepatomegaly  Ext: no edema mild right knee swelling and bruising Musculoskeletal:  No deformities, BUE and BLE strength normal and equal Skin: warm and dry  Neuro:  CNs 2-12 intact, no focal abnormalities noted Psych:  Normal affect    Laboratory Data: High Sensitivity Troponin:   Recent Labs  Lab 04/30/24 1905 04/30/24 2145 05/05/24 0114 05/05/24 0240 05/09/24 1945  TROPONINIHS 26* 32* 44* 37* 47*     Chemistry Recent Labs  Lab 05/08/24 0215 05/09/24 0318 05/09/24 1945  NA 140 140 139  K 3.4* 4.7 3.5  CL 100 101 102  CO2 26 28 20*  GLUCOSE 116* 93 125*  BUN 23 24* 23  CREATININE 1.61* 1.63* 1.85*  CALCIUM  8.9 8.7* 9.0  MG 2.0 2.2 1.7  GFRNONAA 46* 45* 39*  ANIONGAP 14 11 17*    Recent Labs  Lab 05/08/24 0215 05/09/24 0318 05/09/24 1945  PROT 6.9 7.4 6.3*   ALBUMIN 3.1* 3.2* 3.1*  AST 107* 64* 47*  ALT 196* 147* 121*  ALKPHOS 74 72 72  BILITOT 0.9 1.5* 0.7   Lipids No results for input(s): CHOL, TRIG, HDL, LABVLDL, LDLCALC, CHOLHDL in the last 168 hours.  Hematology Recent Labs  Lab 05/06/24 0324 05/08/24 0215 05/09/24 1945  WBC 5.4 7.3 7.8  RBC 3.97* 4.23 4.16*  HGB 11.3* 12.1* 12.2*  HCT 36.1* 38.6* 38.0*  MCV 90.9 91.3 91.3  MCH 28.5 28.6 29.3  MCHC 31.3 31.3 32.1  RDW 17.3* 17.4* 17.7*  PLT 217 274 330   Thyroid   Recent Labs  Lab 05/05/24 0114  TSH 7.753*  FREET4 1.30*    BNP Recent Labs  Lab 05/05/24 0114 05/09/24 1945  BNP 470.9* 749.4*    DDimer No results for input(s): DDIMER in the last 168 hours.  Radiology/Studies:  DG Knee Right Port Result Date: 05/09/2024 EXAM: 1 or 2 VIEW(S) XRAY OF THE KNEE 05/09/2024 08:44:00 PM COMPARISON:  03/26/2024 CLINICAL HISTORY: recent fall FINDINGS: BONES AND JOINTS: No acute fracture. No focal osseous lesion. No joint dislocation. No significant joint effusion. No significant degenerative changes. SOFT TISSUES: Femoropopliteal vascular calcifications noted. IMPRESSION: 1. No acute fracture or dislocation. Electronically signed by: Norman Gatlin MD 05/09/2024 08:49 PM EST RP Workstation: [id number]   DG Chest Portable 1 View Result Date: 05/09/2024 EXAM: 1 VIEW(S) XRAY OF THE CHEST 05/09/2024 08:43:00 PM COMPARISON: 05/05/2024 CLINICAL HISTORY: defib FINDINGS: LINES, TUBES AND DEVICES: Left chest wall cardiac device stable in position. LUNGS AND PLEURA: No focal pulmonary opacity. No pulmonary edema. No pleural effusion. No pneumothorax. HEART AND MEDIASTINUM: Persistent cardiomegaly. Aortic calcification. BONES AND SOFT TISSUES: Status post median sternotomy. No acute osseous abnormality. IMPRESSION: 1. Persistent cardiomegaly. Electronically signed by: Norman Gatlin MD 05/09/2024 08:48 PM EST RP Workstation: [id number]     Assessment and Plan: Dizziness, fall,  syncope, unclear etiology. Per history suspicious for cardiogenic syncope, patient wife/significant other reports?  ICD shock Recent multiple mechanical falls. Deconditioning, autonomic dysfunction.  Recent encephalopathy Ischemic cardiomyopathy EF 20% status post CRT-D.  Nonischemic cardiomyopathy Mechanical aortic valve on Coumadin  History of A-fib currently V paced, hyper lipidemia, multiple other comorbidities as noted  Plan: -> ICD interrogation pending , Would hold off on any diuresis right now given his blood pressure on arrival was 80/50, he is mentating well, extremities are warm do not think he is in cardiogenic shock, he is at high risk for decompensation, overall very poor prognosis, recently had multiple falls, encephalopathy, autonomic dysfunction -= Also on mechanical aortic valve takes Coumadin , continue the same, INR 2.2  Will follow along  Risk Assessment/Risk Scores:       New York  Heart Association (NYHA) Functional Class NYHA Class III  CHA2DS2-VASc Score = 5   This indicates a 7.2% annual risk of stroke. The patient's score is based upon: CHF History: 1 HTN History: 0 Diabetes History: 0 Stroke History: 2 Vascular Disease History: 1 Age Score: 1 Gender Score: 0        For questions or updates, please contact [facility] Please consult [email or web address] for contact info under    Signed, Grayce Bold, MD  05/09/2024 11:55 PM

## 2024-05-09 NOTE — ED Triage Notes (Signed)
 The pt was just discharged from [facility] hospital 2 days and  the pt had an episode of not breathing and his girlfriend  did cpr until he  came back to  they were getting married and it never  was completed  at present the pt is alert

## 2024-05-09 NOTE — ED Triage Notes (Signed)
 Patients wife brought him in from home. States that he fell in the house earlier today. She reports that he didn't hit his head but he vomited all the way here. She reports the patients pacemaker have shocked him. She states she sent a rhythm to the doctor. She reports that he has a ACID.

## 2024-05-09 NOTE — Consult Note (Signed)
 PHARMACY - ANTICOAGULATION CONSULT NOTE  Pharmacy Consult for Warfarin with Enoxaparin  Bridge Indication: history of mechanical aortic valve replacement and afib.   Allergies  Allergen Reactions   Latex Hives   Nicotine  Hives and Rash    Patches caused localized rash and hives     Patient Measurements: Weight: 99.6 kg (219 lb 9.3 oz)  Vital Signs: Temp: 98.4 F (36.9 C) (11/07 0749) Temp Source: Oral (11/07 0749) BP: 107/73 (11/07 1217) Pulse Rate: 81 (11/07 1217)  Labs: Recent Labs    05/07/24 0502 05/07/24 0826 05/08/24 0215 05/09/24 0318  HGB  --   --  12.1*  --   HCT  --   --  38.6*  --   PLT  --   --  274  --   LABPROT 17.1*  --  16.7* 20.3*  INR 1.3*  --  1.3* 1.6*  CREATININE  --  1.57* 1.61* 1.63*    Estimated Creatinine Clearance: 47.2 mL/min (A) (by C-G formula based on SCr of 1.63 mg/dL (H)).   Medical History: Past Medical History:  Diagnosis Date   Aneurysm    Bicuspid aortic valve    a. s/p #27 Carbomedics mechanical valve on 03/25/2010; b. on Coumadin ; c. TTE 12/17: EF 40-45%, moderately dilated LV with moderate LVH, AVR well-seated with 14 mmHg gradient, peak AV velocity 2.5 m/s, mild mitral valve thickening with mild MR, mildly dilated RV with mildly reduced contraction   Cellulitis    CHF (congestive heart failure) (HCC)    Chronic kidney disease    Chronic systolic CHF (congestive heart failure) (HCC)    a. R/LHC 03/2010 showed no significant CAD, LVEDP 31 mmHg, mean AoV gradient 34 mmHg at rest and 47 mmHg with dobutamine 20 mcg/kg/min, AVA 1.0 cm^2, RA 31, RV 68/25, PA 68/47, PCWP 38. PA sat 65%. CO 6.2 L/min (Fick) and 5.3 L/min (thermodilution)   Clotting disorder    COPD (chronic obstructive pulmonary disease) (HCC)    H/O mechanical aortic valve replacement 03/25/2010   a. #27 Carbomedics mechanical valve   Hearing loss    High cholesterol    HTN (hypertension)    Hypercholesterolemia    Renal infarct 2017   Multiple right renal  infarcts, likely embolic.   Stage 3 chronic kidney disease (HCC)    Stroke (HCC)    TIA (transient ischemic attack) 05/2014    Medications:  PTA: Enoxaparin  72mg  Q12 hours    Assessment: 68 yo male with PMH listed above who presented to the ED with encephalopathy, acute on chronic HFrEF, acute respiratory failure with hypoxia and afib. Patient with history of mechanical aortic valve replacement is taking warfarin and enoxaparin  to get INR therapeutic. It seems patient has not been therapeutic in over a month. Our team spoke with patient and wife and they state the patient takes 4 mg one day and then repeats 4 mg the second day and then takes 7.5 mg the third day and repeats this pattern. Warfarin is recommended for long term treatment for mechanical valve. CHADSAVSc is 7 +/- 1 point. Pt was on amiodarone  which can increase INR, but is currently held.   Date INR Dose  11/5 1.3 7.5mg   11/6 1.3 7.5 mg  11/7 1.6 7.5 mg        Goal of Therapy:  INR 2.5-3.5 Monitor platelets by anticoagulation protocol: Yes   Plan:  INR is subtherapeutic but trending up. Will give warfarin 7.5 mg x 1. Predict INR will continue to trend up.  Daily INR ordered. CBC at least every 3 days. Defer warfarin adjustments to outpatient provider.   Cathaleen GORMAN Blanch, PharmD, BCPS 05/09/2024,1:12 PM

## 2024-05-09 NOTE — Consult Note (Incomplete)
 Cardiology Consultation   Patient ID: Macallister Ashmead MRN: [id number]; DOB: 1956-06-01  Admit date: 05/09/2024 Date of Consult: 05/09/2024  PCP:  [name], DO   [facility] Providers Cardiologist:  [name], MD  Electrophysiologist:  Danelle Birmingham, MD  Sleep Medicine:  Wilbert Bihari, MD  { Click here to update MD or APP on Care Team, Refresh:1}     Patient Profile: Roberto [name] is a 68 y.o. male with a hx of mechanical aortic valve replacement, non-obstructive coronary artery disease, thoracic aortic aneurysm (followed by [facility]CTS), stroke, right renal infarct, chronic systolic heart failure (LVEF 20% by cardiac MRI in 06/2023) complicated by VT s/p ICD and subsequent upgrade to CRT-D, persistent atrial fibrillation, hyperlipidemia, obstructive sleep apnea not using CPAP, and morbid obesity  who is being seen 05/09/2024 for the evaluation of fall/syncope at the request of Dr Franklyn.  History of Present Illness: Roberto [name] is a 68 y.o. male with a hx of mechanical aortic valve replacement, non-obstructive coronary artery disease, thoracic aortic aneurysm (followed by [facility]), stroke, right renal infarct, chronic systolic heart failure (LVEF 20% by cardiac MRI in 06/2023) complicated by VT s/p ICD and subsequent upgrade to CRT-D, persistent atrial fibrillation, hyperlipidemia, obstructive sleep apnea not using CPAP, and morbid obesity  who is being seen 05/09/2024 for the evaluation of fall/syncope    Please note patient has had multiple hospitalization for fall, dizziness, recently admitted at [facility], discharged today morning, now comes back in the evening with fall, syncope,.  Wife/significant other report they were getting married and that is why he got out of the hospital, as he was walking with a walker suddenly fell down, was having G3 movements, shaking, then all of a sudden he became normal and woke up.  Recent admission on 02/2024 for wide-complex  tachycardia s/p BiV pacemaker, was hypotension intolerant to GDMT. Subsequent admission to [facility] 9/25 for poor oral intake, multiple recent admissions dizziness and falls  Currently patient's blood pressure is soft, he is hypoxic at 76/88 on room air, telemetry shows V-paced rhythm. EKG shows V paced rhythm. He denies any chest pain reports he has no shortness of breath Laboratory investigation shows BNP 749 which is slightly higher than 400 before,. Troponin is low 47. Creatinine is 1.85 baseline creatinine is around 1.6. INR is 2.2. Glucose 125. Chest x-ray shows persistent cardiomegaly. Right knee no fracture.  His cardiac workup as below Relevant CV Studies:   03/2024 RHC 1. Normal PCWP 2. Mild mixed pulmonary arterial/pulmonary venous hypertension 3. Predominant RV failure with prominent V waves in the RA tracing suggestive of significant tricuspid regurgitation, mean RA pressure 13.  PAPi is low.  4. CO is low but not markedly low.    09/2023 Echo complete 1. Left ventricular ejection fraction, by estimation, is 25 to 30%. Left  ventricular ejection fraction by 3D volume is 28 %. The left ventricle has  severely decreased function. The left ventricle demonstrates global  hypokinesis. The left ventricular  internal cavity size was severely dilated. Left ventricular diastolic  parameters are consistent with Grade II diastolic dysfunction  (pseudonormalization). The average left ventricular global longitudinal  strain is -10.5 %. The global longitudinal strain  is abnormal.   2. Right ventricular systolic function is mildly reduced. The right  ventricular size is normal.   3. Left atrial size was mild to moderately dilated.   4. Right atrial size was moderately dilated.   5. The mitral valve is degenerative. Mild to moderate  mitral valve  regurgitation. No evidence of mitral stenosis.   6. Tricuspid valve regurgitation is mild to moderate.   7. The aortic valve has been  repaired/replaced. Aortic valve  regurgitation versus perivalvular leak is trivial. Mild to moderate aortic  valve stenosis with at least some component of patient-prosthesis  mismatch. Aortic valve mean gradient measures 12.6  mmHg.   8. There is mild dilatation of the aortic root, measuring 43 mm. Known  severe dilation of the ascending aorta is not well-visualized on this  examination.   Past Medical History:  Diagnosis Date  . Aneurysm   . Bicuspid aortic valve    a. s/p #27 Carbomedics mechanical valve on 03/25/2010; b. on Coumadin ; c. TTE 12/17: EF 40-45%, moderately dilated LV with moderate LVH, AVR well-seated with 14 mmHg gradient, peak AV velocity 2.5 m/s, mild mitral valve thickening with mild MR, mildly dilated RV with mildly reduced contraction  . Cellulitis   . CHF (congestive heart failure) (HCC)   . Chronic kidney disease   . Chronic systolic CHF (congestive heart failure) (HCC)    a. R/LHC 03/2010 showed no significant CAD, LVEDP 31 mmHg, mean AoV gradient 34 mmHg at rest and 47 mmHg with dobutamine 20 mcg/kg/min, AVA 1.0 cm^2, RA 31, RV 68/25, PA 68/47, PCWP 38. PA sat 65%. CO 6.2 L/min (Fick) and 5.3 L/min (thermodilution)  . Clotting disorder   . COPD (chronic obstructive pulmonary disease) (HCC)   . H/O mechanical aortic valve replacement 03/25/2010   a. #27 Carbomedics mechanical valve  . Hearing loss   . High cholesterol   . HTN (hypertension)   . Hypercholesterolemia   . Renal infarct 2017   Multiple right renal infarcts, likely embolic.  . Stage 3 chronic kidney disease (HCC)   . Stroke (HCC)   . TIA (transient ischemic attack) 05/2014    Past Surgical History:  Procedure Laterality Date  . AORTIC VALVE REPLACEMENT    . AORTIC VALVE SURGERY    . CARDIAC CATHETERIZATION  03/21/2010   No significant CAD. Severe aortic stenosis. Severely elevated left and right heart filling pressures.  SABRA CARDIAC SURGERY  2009   CHF  . CARDIOVERSION N/A 08/28/2023    Procedure: CARDIOVERSION;  Surgeon: [name] Slain, MD;  Location: [facility] INVASIVE CV LAB;  Service: Cardiovascular;  Laterality: N/A;  . CARPAL TUNNEL RELEASE Left 2005  . COLONOSCOPY N/A 12/13/2023   Procedure: COLONOSCOPY;  Surgeon: Suzann Inocente HERO, MD;  Location: [facility];  Service: Gastroenterology;  Laterality: N/A;  . ICD IMPLANT N/A 06/25/2023   Procedure: ICD IMPLANT;  Surgeon: Waddell Danelle ORN, MD;  Location: [facility] INVASIVE CV LAB;  Service: Cardiovascular;  Laterality: N/A;  . PACEMAKER IMPLANT    . POLYPECTOMY  12/13/2023   Procedure: POLYPECTOMY, INTESTINE;  Surgeon: Suzann Inocente HERO, MD;  Location: [facility];  Service: Gastroenterology;;  . RIGHT HEART CATH Right 03/21/2024   Procedure: RIGHT HEART CATH;  Surgeon: Rolan Ezra RAMAN, MD;  Location: [facility] INVASIVE CV LAB;  Service: Cardiovascular;  Laterality: Right;  . RIGHT HEART CATH AND CORONARY ANGIOGRAPHY N/A 01/28/2019   Procedure: RIGHT HEART CATH AND CORONARY ANGIOGRAPHY;  Surgeon: [name], MD;  Location: [facility] INVASIVE CV LAB;  Service: Cardiovascular;  Laterality: N/A;  . RIGHT HEART CATH AND CORONARY ANGIOGRAPHY N/A 06/20/2023   Procedure: RIGHT HEART CATH AND CORONARY ANGIOGRAPHY;  Surgeon: [name], MD;  Location: [facility] INVASIVE CV LAB;  Service: Cardiovascular;  Laterality: N/A;  . TONSILLECTOMY  1962     {  Home Medications (Optional):21181}  Scheduled Meds:  Continuous Infusions:  PRN Meds:   Allergies:    Allergies  Allergen Reactions  . Latex Hives  . Nicotine  Hives and Rash    Patches caused localized rash and hives     Social History:   Social History   Socioeconomic History  . Marital status: Divorced    Spouse name: Not on file  . Number of children: 1  . Years of education: 67  . Highest education level: Associate degree: occupational, scientist, product/process development, or vocational program  Occupational History  . Occupation: Disabled    Employer: UNEMPLOYED  Tobacco Use  . Smoking status:  Every Day    Current packs/day: 0.20    Average packs/day: 0.5 packs/day for 48.4 years (23.8 ttl pk-yrs)    Types: Cigarettes    Start date: 54    Last attempt to quit: 2021  . Smokeless tobacco: Never  . Tobacco comments:    Caregiver reports patient currently smokes approximately 2 cigarettes/day.  Vaping Use  . Vaping status: Never Used  Substance and Sexual Activity  . Alcohol use: Never  . Drug use: Never  . Sexual activity: Not Currently  Other Topics Concern  . Not on file  Social History Narrative  . Not on file   Social Drivers of Health   Financial Resource Strain: High Risk (04/21/2024)   Overall Financial Resource Strain (CARDIA)   . Difficulty of Paying Living Expenses: Hard  Food Insecurity: Food Insecurity Present (05/05/2024)   Hunger Vital Sign   . Worried About Programme Researcher, Broadcasting/film/video in the Last Year: Never true   . Ran Out of Food in the Last Year: Sometimes true  Transportation Needs: No Transportation Needs (05/05/2024)   PRAPARE - Transportation   . Lack of Transportation (Medical): No   . Lack of Transportation (Non-Medical): No  Recent Concern: Transportation Needs - Unmet Transportation Needs (05/01/2024)   PRAPARE - Transportation   . Lack of Transportation (Medical): Yes   . Lack of Transportation (Non-Medical): No  Physical Activity: Inactive (04/21/2024)   Exercise Vital Sign   . Days of Exercise per Week: 0 days   . Minutes of Exercise per Session: Not on file  Stress: Stress Concern Present (04/21/2024)   [employer] of Occupational Health - Occupational Stress Questionnaire   . Feeling of Stress: Very much  Social Connections: Socially Isolated (05/05/2024)   Social Connection and Isolation Panel   . Frequency of Communication with Friends and Family: Never   . Frequency of Social Gatherings with Friends and Family: Never   . Attends Religious Services: Never   . Active Member of Clubs or Organizations: No   . Attends Tax Inspector Meetings: Never   . Marital Status: Living with partner  Intimate Partner Violence: Not At Risk (05/05/2024)   Humiliation, Afraid, Rape, and Kick questionnaire   . Fear of Current or Ex-Partner: No   . Emotionally Abused: No   . Physically Abused: No   . Sexually Abused: No    Family History:   *** Family History  Problem Relation Age of Onset  . Arthritis Mother   . Dementia Mother   . Colon cancer Mother   . Arthritis Father   . Diabetes Father   . Stroke Father   . Colon cancer Father   . Heart attack Brother   . Breast cancer Sister   . Seizures Sister   . Cancer Brother  brain  . Heart disease Brother   . Heart attack Brother      ROS:  Please see the history of present illness.  *** All other ROS reviewed and negative.     Physical Exam/Data: Vitals:   05/09/24 2215 05/09/24 2230 05/09/24 2315 05/09/24 2330  BP: 107/85  99/73 102/75  Pulse:   (!) 40 (!) 103  Resp:   16 (!) 21  Temp:  98 F (36.7 C)    SpO2:   (!) 83% (!) 76%  Weight:      Height:       No intake or output data in the 24 hours ending 05/09/24 2355    05/09/2024    6:24 PM 05/09/2024    5:00 AM 05/08/2024    4:37 AM  Last 3 Weights  Weight (lbs) 219 lb 9.3 oz 219 lb 9.3 oz 221 lb 9 oz  Weight (kg) 99.6 kg 99.6 kg 100.5 kg     Body mass index is 35.44 kg/m.  General:  Well nourished, well developed, in no acute distress*** HEENT: normal Neck: no JVD Vascular: No carotid bruits; Distal pulses 2+ bilaterally Cardiac:  normal S1, S2; RRR; no murmur *** Lungs:  clear to auscultation bilaterally, no wheezing, rhonchi or rales  Abd: soft, nontender, no hepatomegaly  Ext: no edema Musculoskeletal:  No deformities, BUE and BLE strength normal and equal Skin: warm and dry  Neuro:  CNs 2-12 intact, no focal abnormalities noted Psych:  Normal affect   EKG:  The EKG was personally reviewed and demonstrates:  *** Telemetry:  Telemetry was personally reviewed and  demonstrates:  ***  Relevant CV Studies: ***  Laboratory Data: High Sensitivity Troponin:   Recent Labs  Lab 04/30/24 1905 04/30/24 2145 05/05/24 0114 05/05/24 0240 05/09/24 1945  TROPONINIHS 26* 32* 44* 37* 47*     Chemistry Recent Labs  Lab 05/08/24 0215 05/09/24 0318 05/09/24 1945  NA 140 140 139  K 3.4* 4.7 3.5  CL 100 101 102  CO2 26 28 20*  GLUCOSE 116* 93 125*  BUN 23 24* 23  CREATININE 1.61* 1.63* 1.85*  CALCIUM  8.9 8.7* 9.0  MG 2.0 2.2 1.7  GFRNONAA 46* 45* 39*  ANIONGAP 14 11 17*    Recent Labs  Lab 05/08/24 0215 05/09/24 0318 05/09/24 1945  PROT 6.9 7.4 6.3*  ALBUMIN 3.1* 3.2* 3.1*  AST 107* 64* 47*  ALT 196* 147* 121*  ALKPHOS 74 72 72  BILITOT 0.9 1.5* 0.7   Lipids No results for input(s): CHOL, TRIG, HDL, LABVLDL, LDLCALC, CHOLHDL in the last 168 hours.  Hematology Recent Labs  Lab 05/06/24 0324 05/08/24 0215 05/09/24 1945  WBC 5.4 7.3 7.8  RBC 3.97* 4.23 4.16*  HGB 11.3* 12.1* 12.2*  HCT 36.1* 38.6* 38.0*  MCV 90.9 91.3 91.3  MCH 28.5 28.6 29.3  MCHC 31.3 31.3 32.1  RDW 17.3* 17.4* 17.7*  PLT 217 274 330   Thyroid   Recent Labs  Lab 05/05/24 0114  TSH 7.753*  FREET4 1.30*    BNP Recent Labs  Lab 05/05/24 0114 05/09/24 1945  BNP 470.9* 749.4*    DDimer No results for input(s): DDIMER in the last 168 hours.  Radiology/Studies:  DG Knee Right Port Result Date: 05/09/2024 EXAM: 1 or 2 VIEW(S) XRAY OF THE KNEE 05/09/2024 08:44:00 PM COMPARISON: 03/26/2024 CLINICAL HISTORY: recent fall FINDINGS: BONES AND JOINTS: No acute fracture. No focal osseous lesion. No joint dislocation. No significant joint effusion. No significant degenerative changes. SOFT  TISSUES: Femoropopliteal vascular calcifications noted. IMPRESSION: 1. No acute fracture or dislocation. Electronically signed by: Norman Gatlin MD 05/09/2024 08:49 PM EST RP Workstation: [id number]   DG Chest Portable 1 View Result Date: 05/09/2024 EXAM: 1 VIEW(S)  XRAY OF THE CHEST 05/09/2024 08:43:00 PM COMPARISON: 05/05/2024 CLINICAL HISTORY: defib FINDINGS: LINES, TUBES AND DEVICES: Left chest wall cardiac device stable in position. LUNGS AND PLEURA: No focal pulmonary opacity. No pulmonary edema. No pleural effusion. No pneumothorax. HEART AND MEDIASTINUM: Persistent cardiomegaly. Aortic calcification. BONES AND SOFT TISSUES: Status post median sternotomy. No acute osseous abnormality. IMPRESSION: 1. Persistent cardiomegaly. Electronically signed by: Norman Gatlin MD 05/09/2024 08:48 PM EST RP Workstation: [id number]     Assessment and Plan: Dizziness, fall, syncope, unclear etiology. Per history suspicious for cardiogenic syncope, patient wife/significant other reports?  ICD shock Recent multiple mechanical falls. Deconditioning, autonomic dysfunction.  Recent encephalopathy Ischemic cardiomyopathy EF 20% status post CRT-D.  Nonischemic cardiomyopathy Mechanical aortic valve on Coumadin  History of A-fib currently V paced, hyper lipidemia, multiple other comorbidities as noted  Plan: -> ICD interrogation pending , Would hold off on any diuresis right now given his blood pressure on arrival was 80/50, he is mentating well, extremities are warm do not think he is in cardiogenic shock, he is at high risk for decompensation, overall very poor prognosis, recently had multiple falls, encephalopathy, autonomic dysfunction -= Also on mechanical aortic valve takes Coumadin , continue the same, INR 2.2  Will follow along  Risk Assessment/Risk Scores: {Complete the following score calculators/questions to meet required metrics.  Press F2         :[id number]}      New York  Heart Association (NYHA) Functional Class NYHA Class III  CHA2DS2-VASc Score = 5  {Confirm score is correct.  If not, click here to update score.  REFRESH note.  :1} This indicates a 7.2% annual risk of stroke. The patient's score is based upon: CHF History: 1 HTN History: 0 Diabetes  History: 0 Stroke History: 2 Vascular Disease History: 1 Age Score: 1 Gender Score: 0   {This patient has a significant risk of stroke if diagnosed with atrial fibrillation.  Please consider VKA or DOAC agent for anticoagulation if the bleeding risk is acceptable.   You can also use the SmartPhrase .[email or web address] for documentation.   :[id number]}     For questions or updates, please contact [facility] Please consult [email or web address] for contact info under    Signed, Grayce Bold, MD  05/09/2024 11:55 PM

## 2024-05-09 NOTE — Progress Notes (Signed)
   05/09/24 2017  Spiritual Encounters  Type of Visit Initial  Care provided to: Pt and family  Reason for visit Religious ritual  OnCall Visit Yes   Chaplain responded to page from nurse. Patient requested Chaplain to complete marriage ceremony between patient and his partner. Chaplain visited with Patient and his partner and explained that chaplain was not ordained and, thus, could not perform marriage ceremony. Chaplain advised Patient that Chaplain, who would be here in morning, is ordained and may be able to perform the marriage ceremony. Chaplain remains available.  Chaplain Therisa Samuel

## 2024-05-10 ENCOUNTER — Observation Stay (HOSPITAL_COMMUNITY)

## 2024-05-10 DIAGNOSIS — Z4502 Encounter for adjustment and management of automatic implantable cardiac defibrillator: Secondary | ICD-10-CM | POA: Diagnosis not present

## 2024-05-10 DIAGNOSIS — R55 Syncope and collapse: Secondary | ICD-10-CM | POA: Diagnosis not present

## 2024-05-10 DIAGNOSIS — I5022 Chronic systolic (congestive) heart failure: Secondary | ICD-10-CM

## 2024-05-10 DIAGNOSIS — T671XXA Heat syncope, initial encounter: Secondary | ICD-10-CM | POA: Diagnosis not present

## 2024-05-10 DIAGNOSIS — I4892 Unspecified atrial flutter: Secondary | ICD-10-CM

## 2024-05-10 DIAGNOSIS — I4891 Unspecified atrial fibrillation: Secondary | ICD-10-CM

## 2024-05-10 DIAGNOSIS — G4733 Obstructive sleep apnea (adult) (pediatric): Secondary | ICD-10-CM

## 2024-05-10 DIAGNOSIS — R569 Unspecified convulsions: Secondary | ICD-10-CM

## 2024-05-10 DIAGNOSIS — I472 Ventricular tachycardia, unspecified: Secondary | ICD-10-CM

## 2024-05-10 LAB — CULTURE, BLOOD (ROUTINE X 2)
Culture: NO GROWTH
Culture: NO GROWTH
Special Requests: ADEQUATE
Special Requests: ADEQUATE

## 2024-05-10 LAB — TROPONIN I (HIGH SENSITIVITY): Troponin I (High Sensitivity): 55 ng/L — ABNORMAL HIGH (ref <18)

## 2024-05-10 MED ORDER — BUDESON-GLYCOPYRROL-FORMOTEROL 160-9-4.8 MCG/ACT IN AERO
2.0000 | INHALATION_SPRAY | Freq: Two times a day (BID) | RESPIRATORY_TRACT | Status: DC
  Administered 2024-05-10 – 2024-05-14 (×7): 2 via RESPIRATORY_TRACT
  Filled 2024-05-10 (×2): qty 5.9

## 2024-05-10 MED ORDER — MEXILETINE HCL 150 MG PO CAPS
150.0000 mg | ORAL_CAPSULE | Freq: Three times a day (TID) | ORAL | Status: DC
  Administered 2024-05-10 – 2024-05-14 (×13): 150 mg via ORAL
  Filled 2024-05-10 (×15): qty 1

## 2024-05-10 MED ORDER — ACETAMINOPHEN 325 MG PO TABS
650.0000 mg | ORAL_TABLET | Freq: Four times a day (QID) | ORAL | Status: DC | PRN
  Administered 2024-05-10 – 2024-05-11 (×3): 650 mg via ORAL
  Filled 2024-05-10 (×3): qty 2

## 2024-05-10 MED ORDER — WARFARIN - PHARMACIST DOSING INPATIENT: Freq: Every day | Status: DC

## 2024-05-10 MED ORDER — PANTOPRAZOLE SODIUM 40 MG PO TBEC
40.0000 mg | DELAYED_RELEASE_TABLET | Freq: Two times a day (BID) | ORAL | Status: DC
  Administered 2024-05-10 – 2024-05-14 (×9): 40 mg via ORAL
  Filled 2024-05-10 (×9): qty 1

## 2024-05-10 MED ORDER — ALLOPURINOL 100 MG PO TABS
100.0000 mg | ORAL_TABLET | Freq: Every day | ORAL | Status: DC
  Administered 2024-05-10 – 2024-05-14 (×5): 100 mg via ORAL
  Filled 2024-05-10 (×5): qty 1

## 2024-05-10 MED ORDER — NORTRIPTYLINE HCL 10 MG PO CAPS
10.0000 mg | ORAL_CAPSULE | Freq: Every day | ORAL | Status: DC
  Administered 2024-05-10 – 2024-05-13 (×4): 10 mg via ORAL
  Filled 2024-05-10 (×5): qty 1

## 2024-05-10 MED ORDER — TRAMADOL HCL 50 MG PO TABS
50.0000 mg | ORAL_TABLET | Freq: Four times a day (QID) | ORAL | Status: DC | PRN
Start: 2024-05-10 — End: 2024-05-14
  Administered 2024-05-10 – 2024-05-13 (×5): 50 mg via ORAL
  Filled 2024-05-10 (×5): qty 1

## 2024-05-10 MED ORDER — WARFARIN SODIUM 5 MG PO TABS
5.0000 mg | ORAL_TABLET | Freq: Once | ORAL | Status: AC
  Administered 2024-05-10: 5 mg via ORAL
  Filled 2024-05-10 (×2): qty 1

## 2024-05-10 MED ORDER — BUPROPION HCL ER (XL) 150 MG PO TB24
300.0000 mg | ORAL_TABLET | Freq: Every day | ORAL | Status: DC
  Administered 2024-05-10 – 2024-05-14 (×5): 300 mg via ORAL
  Filled 2024-05-10 (×5): qty 2

## 2024-05-10 MED ORDER — MIDODRINE HCL 5 MG PO TABS
10.0000 mg | ORAL_TABLET | Freq: Three times a day (TID) | ORAL | Status: DC
  Administered 2024-05-10 – 2024-05-12 (×8): 10 mg via ORAL
  Filled 2024-05-10 (×8): qty 2

## 2024-05-10 MED ORDER — BACLOFEN 10 MG PO TABS
10.0000 mg | ORAL_TABLET | Freq: Two times a day (BID) | ORAL | Status: DC
  Administered 2024-05-10 – 2024-05-14 (×9): 10 mg via ORAL
  Filled 2024-05-10 (×9): qty 1

## 2024-05-10 MED ORDER — ENOXAPARIN SODIUM 100 MG/ML IJ SOSY
90.0000 mg | PREFILLED_SYRINGE | Freq: Two times a day (BID) | INTRAMUSCULAR | Status: DC
  Administered 2024-05-10 (×2): 90 mg via SUBCUTANEOUS
  Filled 2024-05-10 (×3): qty 0.9

## 2024-05-10 MED ORDER — POTASSIUM CHLORIDE CRYS ER 20 MEQ PO TBCR
40.0000 meq | EXTENDED_RELEASE_TABLET | Freq: Once | ORAL | Status: AC
  Administered 2024-05-10: 40 meq via ORAL
  Filled 2024-05-10: qty 2

## 2024-05-10 MED ORDER — AMIODARONE HCL 200 MG PO TABS
200.0000 mg | ORAL_TABLET | Freq: Two times a day (BID) | ORAL | Status: DC
  Administered 2024-05-10 – 2024-05-14 (×8): 200 mg via ORAL
  Filled 2024-05-10 (×8): qty 1

## 2024-05-10 MED ORDER — ALBUTEROL SULFATE (2.5 MG/3ML) 0.083% IN NEBU: 2.5000 mg | INHALATION_SOLUTION | Freq: Four times a day (QID) | RESPIRATORY_TRACT | Status: DC | PRN

## 2024-05-10 MED ORDER — TRAZODONE HCL 50 MG PO TABS
25.0000 mg | ORAL_TABLET | Freq: Every evening | ORAL | Status: DC | PRN
Start: 2024-05-10 — End: 2024-05-14
  Administered 2024-05-10 – 2024-05-13 (×3): 25 mg via ORAL
  Filled 2024-05-10 (×3): qty 1

## 2024-05-10 NOTE — Plan of Care (Signed)
   Problem: Education: Goal: Knowledge of General Education information will improve Description Including pain rating scale, medication(s)/side effects and non-pharmacologic comfort measures Outcome: Progressing

## 2024-05-10 NOTE — Evaluation (Signed)
 Physical Therapy Evaluation Patient Details Name: Roberto [name] MRN: [id number] DOB: 1956/04/08 Today's Date: 05/10/2024  History of Present Illness  Pt is a 68 y.o. male presented 05/09/24 due to syncope (discharged from [facility] same day). ?ICD shock; PMH of mechanical heart valve on anticoagulation, heart failure, type II diabetic, CKD, liver disease, hypertension, dyslipidemia, CVA/TIA, chronic HFrEF, s/p ICD, hypotension on midodrine , COPD, obesity, essential tremor  Clinical Impression   Pt admitted secondary to problem above with deficits below. PTA patient was briefly home after discharged from [name] with syncopal episode/fall. He normally walks with a rollator with rt knee buckling at times without warning (and typically the cause of his falls). Pt currently requires CGA for safety with ambulation with RW. Education re: advantage of RW vs rollator if his knee buckles. Patient now interested in getting a RW for incr safety with ambulation. Pt can benefit from post-acute OPPT for continued strengthening and fall prevention. Anticipate patient will benefit from PT to address problems listed below. Will continue to follow acutely to maximize functional mobility, independence, and safety.           If plan is discharge home, recommend the following: A little help with walking and/or transfers;A little help with bathing/dressing/bathroom;Assistance with cooking/housework;Direct supervision/assist for medications management;Direct supervision/assist for financial management;Assist for transportation;Help with stairs or ramp for entrance   Can travel by private vehicle        Equipment Recommendations Rolling walker (2 wheels) (pt reports he self-paid for rollator; safer with RW)  Recommendations for Other Services       Functional Status Assessment Patient has had a recent decline in their functional status and demonstrates the ability to make significant improvements in function in a  reasonable and predictable amount of time.     Precautions / Restrictions Precautions Precautions: Fall Recall of Precautions/Restrictions: Intact Restrictions Weight Bearing Restrictions Per Provider Order: No      Mobility  Bed Mobility               General bed mobility comments: NT; pt received and left in chair end of session    Transfers Overall transfer level: Needs assistance Equipment used: Rolling walker (2 wheels) Transfers: Sit to/from Stand Sit to Stand: Modified independent (Device/Increase time)           General transfer comment: no cues needed    Ambulation/Gait Ambulation/Gait assistance: Contact guard assist Gait Distance (Feet): 200 Feet Assistive device: Rolling walker (2 wheels) Gait Pattern/deviations: Step-through pattern       General Gait Details: cues for proximity to RW with explanation of hands under his shoulders in case knee were to give out--giving him best chance of being able to catch himself  Stairs            Wheelchair Mobility     Tilt Bed    Modified Rankin (Stroke Patients Only)       Balance Overall balance assessment: Needs assistance Sitting-balance support: Feet supported, Single extremity supported Sitting balance-Leahy Scale: Good     Standing balance support: No upper extremity supported Standing balance-Leahy Scale: Fair                               Pertinent Vitals/Pain Pain Assessment Pain Assessment: No/denies pain    Home Living Family/patient expects to be discharged to:: Private residence Living Arrangements: Spouse/significant other Available Help at Discharge: Family;Available PRN/intermittently Type of Home: House Home Access: Stairs to  enter Entrance Stairs-Rails: None Entrance Stairs-Number of Steps: 1   Home Layout: Two level;Able to live on main level with bedroom/bathroom Home Equipment: Rollator (4 wheels)      Prior Function Prior Level of Function :  Independent/Modified Independent;History of Falls (last six months)             Mobility Comments: Since Aug when he had his pacemaker replaced he has utilized a occupational hygienist; 3 falls in the last 6 months--fell off steps, and 2 in the yard ADLs Comments: friend/roommate cooks, does laundry, helps with meds; pt mostly IND with ADLs     Extremity/Trunk Assessment   Upper Extremity Assessment Upper Extremity Assessment: Defer to OT evaluation    Lower Extremity Assessment Lower Extremity Assessment: Generalized weakness    Cervical / Trunk Assessment Cervical / Trunk Assessment: Other exceptions Cervical / Trunk Exceptions: overweight  Communication   Communication Communication: Impaired Factors Affecting Communication: Hearing impaired    Cognition Arousal: Alert Behavior During Therapy: WFL for tasks assessed/performed                             Following commands: Intact       Cueing Cueing Techniques: Verbal cues     General Comments General comments (skin integrity, edema, etc.): HR 80-82 paced throughout    Exercises     Assessment/Plan    PT Assessment Patient needs continued PT services  PT Problem List Decreased strength;Decreased range of motion;Decreased activity tolerance;Decreased balance;Decreased knowledge of precautions;Decreased mobility       PT Treatment Interventions DME instruction;Gait training;Patient/family education;Functional mobility training;Therapeutic activities;Therapeutic exercise;Balance training;Neuromuscular re-education;Stair training    PT Goals (Current goals can be found in the Care Plan section)  Acute Rehab PT Goals Patient Stated Goal: return home PT Goal Formulation: With patient Time For Goal Achievement: 05/24/24 Potential to Achieve Goals: Fair    Frequency Min 1X/week     Co-evaluation               AM-PAC PT 6 Clicks Mobility  Outcome Measure Help needed turning from your back to your  side while in a flat bed without using bedrails?: A Little Help needed moving from lying on your back to sitting on the side of a flat bed without using bedrails?: A Little Help needed moving to and from a bed to a chair (including a wheelchair)?: A Little Help needed standing up from a chair using your arms (e.g., wheelchair or bedside chair)?: A Little Help needed to walk in hospital room?: A Little Help needed climbing 3-5 steps with a railing? : A Little 6 Click Score: 18    End of Session Equipment Utilized During Treatment: Gait belt Activity Tolerance: Patient tolerated treatment well Patient left: in chair;with call bell/phone within reach;with chair alarm set Nurse Communication: Mobility status;Other (comment) (Rt knee can buckle) PT Visit Diagnosis: Other abnormalities of gait and mobility (R26.89);Muscle weakness (generalized) (M62.81);Difficulty in walking, not elsewhere classified (R26.2)    Time: 8552-8496 PT Time Calculation (min) (ACUTE ONLY): 16 min   Charges:   PT Evaluation $PT Eval Low Complexity: 1 Low   PT General Charges $$ ACUTE PT VISIT: 1 Visit          Macario RAMAN, PT Acute Rehabilitation Services  Office [phone]   Macario SHAUNNA Soja 05/10/2024, 3:31 PM

## 2024-05-10 NOTE — Progress Notes (Signed)
 PT Cancellation Note  Patient Details Name: Roberto [name] MRN: [id number] DOB: 06/20/1956   Cancelled Treatment:    Reason Eval/Treat Not Completed: Patient at procedure or test/unavailable  Patient currently meeting with Palliative Care for GOC. Will reattempt as schedule permits.    Macario RAMAN, PT Acute Rehabilitation Services  Office [phone]  Macario SHAUNNA Soja 05/10/2024, 11:51 AM

## 2024-05-10 NOTE — ED Notes (Signed)
 [employer] representative at bedside for pacemaker interrogation.

## 2024-05-10 NOTE — Progress Notes (Deleted)
 Routine EEG completed, results pending Neurology review and interpretation

## 2024-05-10 NOTE — ED Notes (Signed)
 02 decreased to 1LNC. Will continue to monitor.

## 2024-05-10 NOTE — Consult Note (Signed)
 Electrophysiology Consultation   Patient ID: Roberto [name] MRN: [id number]; DOB: 1956/06/29  Admit date: 05/09/2024 Date of Consult: 05/10/2024  PCP:  [name], DO   [facility] Providers Cardiologist:  [name], MD  Electrophysiologist:  Danelle Birmingham, MD  Sleep Medicine:  Wilbert Bihari, MD       History of Present Illness: Roberto [name] is a 68 y.o. male with a hx of bicuspid aortic valve status post mechanical AVR, thoracic aortic aneurysm, chronic systolic heart failure status post CRT-D (no atrial lead), ventricular tachycardia, atrial fibrillation who is being seen 05/10/2024 for the evaluation of syncope at the request of Dr. Odell Castor.  Patient recently admitted at [facility] 11/3 through 11/6 for encephalopathy and acute on chronic systolic heart failure.  Notably, his amiodarone  was discontinued due to elevation in his LFTs.  He presented to the [name], ED the day after his discharge with another fall and question of syncope.  Patient denies loss of consciousness but wife reported loss of consciousness associated with shaking movements and was concerned that he had been shocked by his device.  Patient states that he had been feeling fine after leaving the hospital until this event.  He feels like he has returned to normal.  He is not currently short of breath and does not feel like he has significant fluid accumulation.   Past Medical History:  Diagnosis Date   Aneurysm    Bicuspid aortic valve    a. s/p #27 [employer]omed[employer] mechanical valve on 03/25/2010; b. on Coumadin ; c. TTE 12/17: EF 40-45%, moderately dilated LV with moderate LVH, AVR well-seated with 14 mmHg gradient, peak AV velocity 2.5 m/s, mild mitral valve thickening with mild MR, mildly dilated RV with mildly reduced contraction   Cellulitis    CHF (congestive heart failure) (HCC)    Chronic kidney disease    Chronic systolic CHF (congestive heart failure) (HCC)    a. R/LHC 03/2010 showed no  significant CAD, LVEDP 31 mmHg, mean AoV gradient 34 mmHg at rest and 47 mmHg with dobutamine 20 mcg/kg/min, AVA 1.0 cm^2, RA 31, RV 68/25, PA 68/47, PCWP 38. PA sat 65%. CO 6.2 L/min (Fick) and 5.3 L/min (thermodilution)   Clotting disorder    COPD (chronic obstructive pulmonary disease) (HCC)    H/O mechanical aortic valve replacement 03/25/2010   a. 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   Stage 3 chronic kidney disease (HCC)    Stroke [facility])    TIA (transient ischemic attack) 05/2014    Past Surgical History:  Procedure Laterality Date   AORTIC VALVE REPLACEMENT     AORTIC VALVE SURGERY     CARDIAC CATHETERIZATION  03/21/2010   No significant CAD. Severe aortic stenosis. Severely elevated left and right heart filling pressures.   CARDIAC SURGERY  2009   CHF   CARDIOVERSION N/A 08/28/2023   Procedure: CARDIOVERSION;  Surgeon: [name] Slain, MD;  Location[facility] INVASIVE CV LAB;  Service: Cardiovascular;  Laterality: N/A;   CARPAL TUNNEL RELEASE Left 2005   COLONOSCOPY N/A 12/13/2023   Procedure: COLONOSCOPY;  Surgeon: Suzann Inocente HERO, MD;  Location: [facility] ENDOSCOPY;  Service: Gastroenterology;  Laterality: N/A;   ICD IMPLANT N/A 06/25/2023   Procedure: ICD IMPLANT;  Surgeon: Birmingham Danelle ORN, MD;  Location: [facility] INVASIVE CV LAB;  Service: Cardiovascular;  Laterality:  N/A;   PACEMAKER IMPLANT     POLYPECTOMY  12/13/2023   Procedure: POLYPECTOMY, INTESTINE;  Surgeon: Suzann Inocente HERO, MD;  Location: [facility] ENDOSCOPY;  Service: Gastroenterology;;   RIGHT HEART CATH Right 03/21/2024   Procedure: RIGHT HEART CATH;  Surgeon: Rolan Ezra RAMAN, MD;  Location: [facility] INVASIVE CV LAB;  Service: Cardiovascular;  Laterality: Right;   RIGHT HEART CATH AND CORONARY ANGIOGRAPHY N/A 01/28/2019   Procedure: RIGHT HEART CATH AND CORONARY ANGIOGRAPHY;  Surgeon:  Mady Bruckner, MD;  Location: [facility] INVASIVE CV LAB;  Service: Cardiovascular;  Laterality: N/A;   RIGHT HEART CATH AND CORONARY ANGIOGRAPHY N/A 06/20/2023   Procedure: RIGHT HEART CATH AND CORONARY ANGIOGRAPHY;  Surgeon: Mady Bruckner, MD;  Location: [facility] INVASIVE CV LAB;  Service: Cardiovascular;  Laterality: N/A;   TONSILLECTOMY  1962    Scheduled Meds:  allopurinol   100 mg Oral Daily   baclofen   10 mg Oral BID WC   budesonide-glycopyrrolate-formoterol   2 puff Inhalation BID   buPROPion   300 mg Oral Daily   enoxaparin  (LOVENOX ) injection  90 mg Subcutaneous Q12H   mexiletine  150 mg Oral TID   midodrine   10 mg Oral TID WC   nortriptyline   10 mg Oral QHS   pantoprazole   40 mg Oral BID   warfarin  5 mg Oral ONCE-1600   Warfarin - Pharmacist Dosing Inpatient   Does not apply q1600   Continuous Infusions:  PRN Meds: acetaminophen , albuterol , traMADol, traZODone   Allergies:    Allergies  Allergen Reactions   Latex Hives   Nicotine  Hives and Rash    Patches caused localized rash and hives     Social History:   Social History   Socioeconomic History   Marital status: Divorced    Spouse name: Not on file   Number of children: 1   Years of education: 14   Highest education level: Associate degree: occupational, scientist, product/process development, or vocational program  Occupational History   Occupation: Disabled    Employer: UNEMPLOYED  Tobacco Use   Smoking status: Every Day    Current packs/day: 0.20    Average packs/day: 0.5 packs/day for 48.4 years (23.8 ttl pk-yrs)    Types: Cigarettes    Start date: 1974    Last attempt to quit: 2021   Smokeless tobacco: Never   Tobacco comments:    Caregiver reports patient currently smokes approximately 2 cigarettes/day.  Vaping Use   Vaping status: Never Used  Substance and Sexual Activity   Alcohol use: Never   Drug use: Never   Sexual activity: Not Currently  Other Topics Concern   Not on file  Social History Narrative   Not on file    Social Drivers of Health   Financial Resource Strain: High Risk (04/21/2024)   Overall Financial Resource Strain (CARDIA)    Difficulty of Paying Living Expenses: Hard  Food Insecurity: Food Insecurity Present (05/10/2024)   Hunger Vital Sign    Worried About Running Out of Food in the Last Year: Never true    Ran Out of Food in the Last Year: Sometimes true  Transportation Needs: No Transportation Needs (05/10/2024)   PRAPARE - Administrator, Civil Service (Medical): No    Lack of Transportation (Non-Medical): No  Recent Concern: Transportation Needs - Unmet Transportation Needs (05/01/2024)   PRAPARE - Administrator, Civil Service (Medical): Yes    Lack of Transportation (Non-Medical): No  Physical Activity: Inactive (04/21/2024)   Exercise Vital Sign  Days of Exercise per Week: 0 days    Minutes of Exercise per Session: Not on file  Stress: Stress Concern Present (04/21/2024)   [employer] of Occupational Health - Occupational Stress Questionnaire    Feeling of Stress: Very much  Social Connections: Socially Isolated (05/10/2024)   Social Connection and Isolation Panel    Frequency of Communication with Friends and Family: Never    Frequency of Social Gatherings with Friends and Family: Never    Attends Religious Services: Never    Database Administrator or Organizations: No    Attends Banker Meetings: Never    Marital Status: Living with partner  Intimate Partner Violence: Not At Risk (05/10/2024)   Humiliation, Afraid, Rape, and Kick questionnaire    Fear of Current or Ex-Partner: No    Emotionally Abused: No    Physically Abused: No    Sexually Abused: No    Family History:   Family History  Problem Relation Age of Onset   Arthritis Mother    Dementia Mother    Colon cancer Mother    Arthritis Father    Diabetes Father    Stroke Father    Colon cancer Father    Heart attack Brother    Breast cancer Sister     Seizures Sister    Cancer Brother        brain   Heart disease Brother    Heart attack Brother      ROS:  Please see the history of present illness.   All other ROS reviewed and negative.     Physical Exam/Data: Vitals:   05/10/24 0900 05/10/24 1000 05/10/24 1054 05/10/24 1100  BP: 101/71 100/78  120/86  Pulse: 77 79  84  Resp: 15 14  13   Temp:      TempSrc:      SpO2: 98% 99% 98% 96%  Weight:      Height:       No intake or output data in the 24 hours ending 05/10/24 1138    05/09/2024    6:24 PM 05/09/2024    5:00 AM 05/08/2024    4:37 AM  Last 3 Weights  Weight (lbs) 219 lb 9.3 oz 219 lb 9.3 oz 221 lb 9 oz  Weight (kg) 99.6 kg 99.6 kg 100.5 kg     Body mass index is 35.44 kg/m.   General: Chronically ill appearing, in no acute distress.  Neck: JVD elevated Cardiac: Normal rate, regular rhythm.  Resp: Normal work of breathing.  Ext: No edema.  Neuro: No gross focal deficits.  Psych: Normal affect.   EKG:  The EKG was personally reviewed and demonstrates:  BiV paced rhythm, 1 conducted beat with LBBB Telemetry:  Telemetry was personally reviewed and demonstrates:  BiV paced, some NSVT  Relevant CV Studies: Echo 05/05/24  1. Left ventricular ejection fraction, by estimation, is <20%. The left  ventricle has severely decreased function. Left ventricular endocardial  border not optimally defined to evaluate regional wall motion. There is  moderate left ventricular  hypertrophy.   2. Right ventricular systolic function is moderately reduced. The right  ventricular size is mildly enlarged. Tricuspid regurgitation signal is  inadequate for assessing PA pressure.   3. The mitral valve is normal in structure. Moderate mitral valve  regurgitation. No evidence of mitral stenosis.   4. Tricuspid valve regurgitation is moderate to severe.   5. The aortic valve has been repaired/replaced. Aortic valve  regurgitation is not visualized.  Gradients were not measured.   6.  The inferior vena cava is dilated in size with <50% respiratory  variability, suggesting right atrial pressure of 15 mmHg.    Laboratory Data: High Sensitivity Troponin:   Recent Labs  Lab 04/30/24 2145 05/05/24 0114 05/05/24 0240 05/09/24 1945 05/09/24 2320  TROPONINIHS 32* 44* 37* 47* 55*     Chemistry Recent Labs  Lab 05/08/24 0215 05/09/24 0318 05/09/24 1945  NA 140 140 139  K 3.4* 4.7 3.5  CL 100 101 102  CO2 26 28 20*  GLUCOSE 116* 93 125*  BUN 23 24* 23  CREATININE 1.61* 1.63* 1.85*  CALCIUM  8.9 8.7* 9.0  MG 2.0 2.2 1.7  GFRNONAA 46* 45* 39*  ANIONGAP 14 11 17*    Recent Labs  Lab 05/08/24 0215 05/09/24 0318 05/09/24 1945  PROT 6.9 7.4 6.3*  ALBUMIN 3.1* 3.2* 3.1*  AST 107* 64* 47*  ALT 196* 147* 121*  ALKPHOS 74 72 72  BILITOT 0.9 1.5* 0.7   Lipids No results for input(s): CHOL, TRIG, HDL, LABVLDL, LDLCALC, CHOLHDL in the last 168 hours.  Hematology Recent Labs  Lab 05/06/24 0324 05/08/24 0215 05/09/24 1945  WBC 5.4 7.3 7.8  RBC 3.97* 4.23 4.16*  HGB 11.3* 12.1* 12.2*  HCT 36.1* 38.6* 38.0*  MCV 90.9 91.3 91.3  MCH 28.5 28.6 29.3  MCHC 31.3 31.3 32.1  RDW 17.3* 17.4* 17.7*  PLT 217 274 330   Thyroid   Recent Labs  Lab 05/05/24 0114  TSH 7.753*  FREET4 1.30*    BNP Recent Labs  Lab 05/05/24 0114 05/09/24 1945  BNP 470.9* 749.4*    DDimer No results for input(s): DDIMER in the last 168 hours.  Radiology/Studies:  DG Knee Right Port Result Date: 05/09/2024 EXAM: 1 or 2 VIEW(S) XRAY OF THE KNEE 05/09/2024 08:44:00 PM COMPARISON: 03/26/2024 CLINICAL HISTORY: recent fall FINDINGS: BONES AND JOINTS: No acute fracture. No focal osseous lesion. No joint dislocation. No significant joint effusion. No significant degenerative changes. SOFT TISSUES: Femoropopliteal vascular calcifications noted. IMPRESSION: 1. No acute fracture or dislocation. Electronically signed by: Norman Gatlin MD 05/09/2024 08:49 PM EST RP Workstation:  [id number]   DG Chest Portable 1 View Result Date: 05/09/2024 EXAM: 1 VIEW(S) XRAY OF THE CHEST 05/09/2024 08:43:00 PM COMPARISON: 05/05/2024 CLINICAL HISTORY: defib FINDINGS: LINES, TUBES AND DEVICES: Left chest wall cardiac device stable in position. LUNGS AND PLEURA: No focal pulmonary opacity. No pulmonary edema. No pleural effusion. No pneumothorax. HEART AND MEDIASTINUM: Persistent cardiomegaly. Aortic calcification. BONES AND SOFT TISSUES: Status post median sternotomy. No acute osseous abnormality. IMPRESSION: 1. Persistent cardiomegaly. Electronically signed by: Norman Gatlin MD 05/09/2024 08:48 PM EST RP Workstation: [id number]     Assessment and Plan: Mr. Whalin is a 68 year old male with end-stage heart failure who presented to the ED following a syncopal event.  Device interrogation was performed and patient did receive multiple schemes of ATP and 1 device shock yesterday afternoon.  He initially had what device classified as SVT but at some point classification changed and he received ATP therapy for this and after multiple ATPs he accelerated into ventricular tachycardia and ultimately received a 36 J shock.  When comparing the electrograms available to other SVT episodes, it appears that he received inappropriate ATP for SVT.  Unfortunately, patient does not have an atrial lead which would allow for discrimination.  My belief is that he was deemed permanent atrial fibrillation/flutter previously while at [facility] when his single-chamber ICD was upgraded to a CRT-D.  His ECG today does show P waves, but difficult to discern sinus tach versus an atrial flutter.  If he is indeed permanent atrial flutter, then an AV node ablation would be able to prevent further inappropriate device therapies.  If he is in sinus rhythm, then addition of an atrial lead would also be able to serve this purpose.  Ultimately however, patient does have end-stage heart failure and goals of care should be considered  before pursuing any additional procedures/surgeries.  #ICD shock #VT #Atrial fibrillation/flutter #Chronic systolic heart failure #Status post mechanical aortic valve  Plan: -Restart home amidoarone 200mg  BID.  This had been held in setting of elevated LFTs, which was likely due to congestion.  LFTs are now downtrending.  Benefits of amiodarone  and suppressing arrhythmias and preventing ICD shocks likely outweigh risks at this point. -Continue mexiletine 150 mg TID.  -Continue warfarin in setting of mechanical aortic valve and atrial fibrillation/flutter. -Unable to tolerate GDMT due to hypotension.  He does not appear significantly decompensated from a heart failure perspective at this time.  Follows with advanced heart failure as outpatient.  Recommend consultation of our heart failure colleagues while inpatient to assist with management, especially if palliative care is being considered.  Signed, Fonda Kitty, MD  05/10/2024 11:38 AM

## 2024-05-10 NOTE — Progress Notes (Signed)
 Routine EEG completed, results pending Neurology review and interpretation

## 2024-05-10 NOTE — Progress Notes (Signed)
 PHARMACY - ANTICOAGULATION CONSULT NOTE  Pharmacy Consult for warfarin Indication: mechanical aortic valve, afib  Allergies  Allergen Reactions   Latex Hives   Nicotine  Hives and Rash    Patches caused localized rash and hives     Patient Measurements: Height: 5' 6 (167.6 cm) Weight: 99.6 kg (219 lb 9.3 oz) IBW/kg (Calculated) : 63.8 HEPARIN  DW (KG): 85.7  Vital Signs: Temp: 97.6 F (36.4 C) (11/08 0410) Temp Source: Oral (11/08 0410) BP: 140/80 (11/08 0400) Pulse Rate: 79 (11/08 0400)  Labs: Recent Labs    05/08/24 0215 05/09/24 0318 05/09/24 1945 05/09/24 2320  HGB 12.1*  --  12.2*  --   HCT 38.6*  --  38.0*  --   PLT 274  --  330  --   LABPROT 16.7* 20.3* 25.9*  --   INR 1.3* 1.6* 2.2*  --   CREATININE 1.61* 1.63* 1.85*  --   TROPONINIHS  --   --  47* 55*    Estimated Creatinine Clearance: 41.6 mL/min (A) (by C-G formula based on SCr of 1.85 mg/dL (H)).   Medical History: Past Medical History:  Diagnosis Date   Aneurysm    Bicuspid aortic valve    a. s/p #27 [employer]omed[employer] mechanical valve on 03/25/2010; b. on Coumadin ; c. TTE 12/17: EF 40-45%, moderately dilated LV with moderate LVH, AVR well-seated with 14 mmHg gradient, peak AV velocity 2.5 m/s, mild mitral valve thickening with mild MR, mildly dilated RV with mildly reduced contraction   Cellulitis    CHF (congestive heart failure) (HCC)    Chronic kidney disease    Chronic systolic CHF (congestive heart failure) (HCC)    a. R/LHC 03/2010 showed no significant CAD, LVEDP 31 mmHg, mean AoV gradient 34 mmHg at rest and 47 mmHg with dobutamine 20 mcg/kg/min, AVA 1.0 cm^2, RA 31, RV 68/25, PA 68/47, PCWP 38. PA sat 65%. CO 6.2 L/min (Fick) and 5.3 L/min (thermodilution)   Clotting disorder    COPD (chronic obstructive pulmonary disease) (HCC)    H/O mechanical aortic valve replacement 03/25/2010   a. #27 Carbomedics mechanical valve   Hearing loss    High cholesterol    HTN (hypertension)     Hypercholesterolemia    Renal infarct 2017   Multiple right renal infarcts, likely embolic.   Stage 3 chronic kidney disease (HCC)    Stroke (HCC)    TIA (transient ischemic attack) 05/2014    Medications:  -Warfarin/lovenox  bridge: Per PCP note on 04/23/24, patient has recently been struggling to eat without vomiting and, therefore, struggling to maintain goal INR levels so the patient was switched from warfarin to enoxaparin  (03/25/24). Plan was to transition back to warfarin once patient could tolerate oral intake   -Patient was cycling through warfarin and dosing as 4mg  PO on one day, 4mg  PO the next, and then 7.5mg  PO and continuing cycle  Assessment: 31 yoM presented s/p fall and txr from [facility]. PMH includes mAVR (warfarin), non-obstructive CAD, thoracic aortic aneurysm, CVA, atrial fibrillation, HFrEF (LVEF 20%), Vtach s/p CRT-D, HLD. Pharmacy consulted to dose warfarin while patient in hospital.  -INR 2.2 -CBC shows Hgb 12s, plts 330 -Last dose of warfarin: 7.5mg  PO on 11/7 -Last dose of lovenox  bridge: 11/7 @1200   Goal of Therapy:  INR 2.5-3.5 Monitor platelets by anticoagulation protocol: Yes   Plan:  -Give warfarin 64m PO x1 -Lovenox  90mg  [place] every 12 hours until INR within goal -INR daily -CBC daily   Lynwood Poplar, PharmD, BCPS Clinical Pharmacist  05/10/2024 6:20 AM

## 2024-05-10 NOTE — ED Notes (Signed)
 This RN asked by PT's wife to see if pacemaker was interrogated. This RN will go and interrogate pacemaker again

## 2024-05-10 NOTE — ED Notes (Signed)
 EEG at bedside.

## 2024-05-10 NOTE — Procedures (Signed)
 Patient Name: Roberto [name]  MRN: [id number]  Epilepsy Attending: Arlin MALVA Krebs  Referring Physician/Provider: Alfornia Madison, MD  Date: 05/10/2024 Duration: 22.56 mins  Patient history: 68yo M with syncope. EEG to evaluate for seizure  Level of alertness: Awake  AEDs during EEG study: None  Technical aspects: This EEG study was done with scalp electrodes positioned according to the 10-20 International system of electrode placement. Electrical activity was reviewed with band pass filter of 1-70Hz , sensitivity of 7 uV/mm, display speed of 82mm/sec with a 60Hz  notched filter applied as appropriate. EEG data were recorded continuously and digitally stored.  Video monitoring was available and reviewed as appropriate.  Description: The posterior dominant rhythm consists of 8-9Hz  activity of moderate voltage (25-35 uV) seen predominantly in posterior head regions, symmetric and reactive to eye opening and eye closing. Drowsiness was characterized by attenuation of the posterior background rhythm. Hyperventilation and photic stimulation were not performed.     IMPRESSION: This study is within normal limits. No seizures or epileptiform discharges were seen throughout the recording.  A normal interictal EEG does not exclude the diagnosis of epilepsy.   Ashlee Player O Jameison Haji

## 2024-05-10 NOTE — H&P (Signed)
 History and Physical    Roberto [name] FMW:[id number] DOB: September 07, 1955 DOA: 05/09/2024  PCP: [name], DO  Patient coming from: Home  Chief Complaint: Syncope  HPI: [name] is a 68 y.o. male with medical history significant of bicuspid aortic valve status post mechanical valve replacement on warfarin, nonobstructive CAD, thoracic aortic aneurysm followed by cardiothoracic surgery, CVA/TIA, CKD stage IIIa, right renal infarct, HFrEF complicated by VT status post ICD and subsequent upgrade to CRT-D, persistent A-fib, hospital admission in August 2025 for wide-complex tachycardia status post BiV pacemaker, chronic hypotension on midodrine , COPD, prediabetes, hyperlipidemia, OSA on CPAP, obesity (BMI 35.44).  Patient was recently admitted to [facility] for encephalopathy, generalized weakness/multiple falls, elevated LFTs, acute on chronic HFrEF, and acute hypoxemic respiratory failure.  CT head was negative for acute abnormality and ammonia level was normal.  He was diuresed with IV Lasix  40 mg twice daily.  He was discharged from the hospital yesterday.  Patient presents to the ED tonight for evaluation of syncope.  History provided mostly by his wife at bedside who states patient has problems with his right knee causing him to frequently fall.  Tonight they were getting married at home when patient had an episode of syncope.  Wife states patient was using his walker to walk when his right knee gave out and he fell.  No head injury reported.  Someone then helped him get up from the ground when according to the wife patient started convulsing and then lost consciousness for few seconds so she gave him 5 rescue breaths and then his body got shocked and she thinks it was his ICD.  Patient was not drooling and did not have loss of bowel or bladder control.  No history of previous seizures.  Patient denies preceding lightheadedness/dizziness, chest pain, or shortness of breath.  He has no other  complaints.  Wife is requesting patient's home medication for his chronic back pain.  ED Course: Vital signs on arrival: Temperature 99.1 F, pulse 78, respiratory rate 18, blood pressure 96/71, and SpO2 93% on room air.  EKG showing ventricularly paced rhythm.  Labs showing no leukocytosis, hemoglobin 12.2 (stable), bicarb 20, glucose 125, creatinine 1.85 (baseline 1.4-1.6), AST 47, ALT 121 (transaminases improved), alk phos and T. bili normal, UA not suggestive of infection, magnesium  1.7, troponin 47> 55, INR 2.2, BNP 749 (was 470 on labs 5 days ago).  Chest x-ray showing persistent cardiomegaly but no pulmonary edema.  X-ray of right knee showing no acute fracture or dislocation.  Patient was given Tylenol  and oxycodone .  Cardiology consulted.  Review of Systems:  Review of Systems  All other systems reviewed and are negative.   Past Medical History:  Diagnosis Date   Aneurysm    Bicuspid aortic valve    a. s/p #27 [employer]omed[employer] mechanical valve on 03/25/2010; b. on Coumadin ; c. TTE 12/17: EF 40-45%, moderately dilated LV with moderate LVH, AVR well-seated with 14 mmHg gradient, peak AV velocity 2.5 m/s, mild mitral valve thickening with mild MR, mildly dilated RV with mildly reduced contraction   Cellulitis    CHF (congestive heart failure) (HCC)    Chronic kidney disease    Chronic systolic CHF (congestive heart failure) (HCC)    a. R/LHC 03/2010 showed no significant CAD, LVEDP 31 mmHg, mean AoV gradient 34 mmHg at rest and 47 mmHg with dobutamine 20 mcg/kg/min, AVA 1.0 cm^2, RA 31, RV 68/25, PA 68/47, PCWP 38. PA sat 65%. CO 6.2 L/min (Fick) and 5.3 L/min (thermodilution)  Clotting disorder    COPD (chronic obstructive pulmonary disease) (HCC)    H/O mechanical aortic valve replacement 03/25/2010   a. 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    Stage 3 chronic kidney disease (HCC)    Stroke [facility])    TIA (transient ischemic attack) 05/2014    Past Surgical History:  Procedure Laterality Date   AORTIC VALVE REPLACEMENT     AORTIC VALVE SURGERY     CARDIAC CATHETERIZATION  03/21/2010   No significant CAD. Severe aortic stenosis. Severely elevated left and right heart filling pressures.   CARDIAC SURGERY  2009   CHF   CARDIOVERSION N/A 08/28/2023   Procedure: CARDIOVERSION;  Surgeon: [name] Slain, MD;  Location: [facility] INVASIVE CV LAB;  Service: Cardiovascular;  Laterality: N/A;   CARPAL TUNNEL RELEASE Left 2005   COLONOSCOPY N/A 12/13/2023   Procedure: COLONOSCOPY;  Surgeon: Suzann Inocente HERO, MD;  Location: [facility] ENDOSCOPY;  Service: Gastroenterology;  Laterality: N/A;   ICD IMPLANT N/A 06/25/2023   Procedure: ICD IMPLANT;  Surgeon: Waddell Danelle ORN, MD;  Location: [facility] INVASIVE CV LAB;  Service: Cardiovascular;  Laterality: N/A;   PACEMAKER IMPLANT     POLYPECTOMY  12/13/2023   Procedure: POLYPECTOMY, INTESTINE;  Surgeon: Suzann Inocente HERO, MD;  Location: [facility];  Service: Gastroenterology;;   RIGHT HEART CATH Right 03/21/2024   Procedure: RIGHT HEART CATH;  Surgeon: Rolan Ezra RAMAN, MD;  Location: [facility] INVASIVE CV LAB;  Service: Cardiovascular;  Laterality: Right;   RIGHT HEART CATH AND CORONARY ANGIOGRAPHY N/A 01/28/2019   Procedure: RIGHT HEART CATH AND CORONARY ANGIOGRAPHY;  Surgeon: [name], MD;  Location: [facility] INVASIVE CV LAB;  Service: Cardiovascular;  Laterality: N/A;   RIGHT HEART CATH AND CORONARY ANGIOGRAPHY N/A 06/20/2023   Procedure: RIGHT HEART CATH AND CORONARY ANGIOGRAPHY;  Surgeon: [name], MD;  Location: [facility] INVASIVE CV LAB;  Service: Cardiovascular;  Laterality: N/A;   TONSILLECTOMY  1962     reports that he has been smoking cigarettes. He started smoking about 51 years ago. He has a 23.8 pack-year smoking history. He has never used smokeless tobacco. He reports that he does not drink  alcohol and does not use drugs.  Allergies  Allergen Reactions   Latex Hives   Nicotine  Hives and Rash    Patches caused localized rash and hives     Family History  Problem Relation Age of Onset   Arthritis Mother    Dementia Mother    Colon cancer Mother    Arthritis Father    Diabetes Father    Stroke Father    Colon cancer Father    Heart attack Brother    Breast cancer Sister    Seizures Sister    Cancer Brother        brain   Heart disease Brother    Heart attack Brother     Prior to Admission medications   Medication Sig Start Date End Date Taking? Authorizing Provider  albuterol  (PROVENTIL ) (2.5 MG/3ML) 0.083% nebulizer solution USE 1 VIAL IN NEBULIZER EVERY 6 HOURS AS NEEDED FOR WHEEZING FOR SHORTNESS OF BREATH 09/25/23   [name], [name], DO  allopurinol  (ZYLOPRIM ) 100 MG tablet Take 1 tablet (100 mg total) by mouth daily. 03/10/24   [name], MD  amiodarone  (PACERONE ) 200 MG tablet Take 2 tablets by  mouth twice daily 03/25/24   End, [name], MD  baclofen  (LIORESAL ) 10 MG tablet Take 1 tablet (10 mg total) by mouth 2 (two) times daily. Take before food twice a day, stop if not helping 04/25/24 06/24/24  [name], [name]  buPROPion  (WELLBUTRIN  XL) 300 MG 24 hr tablet Take 300 mg by mouth daily. 08/16/23   [provider]  dapagliflozin  propanediol (FARXIGA ) 10 MG TABS tablet Take 1 tablet (10 mg total) by mouth daily before breakfast. 03/19/24   End, [name], MD  enoxaparin  (LOVENOX ) 120 MG/0.8ML injection INJECT 0.72 ML (108MG ) INTO THE SKIN EVERY 12 HOURS 03/27/24   [name], [name], DO  feeding supplement (ENSURE PLUS HIGH PROTEIN) LIQD Take 237 mLs by mouth 2 (two) times daily between meals. 05/09/24   [name], MD  fenofibrate  (TRICOR ) 48 MG tablet Take 48 mg by mouth daily. 03/05/24   [provider]  Fluticasone -Umeclidin-Vilant (TRELEGY ELLIPTA ) 200-62.5-25 MCG/ACT AEPB Inhale 1 puff into the lungs daily in the afternoon. 02/22/24    [name], MD  metoprolol  succinate (TOPROL -XL) 25 MG 24 hr tablet Take 0.5 tablets (12.5 mg total) by mouth daily. Reduced from 50 mg daily. 05/09/24   [name], MD  mexiletine (MEXITIL ) 150 MG capsule Take 150 mg by mouth 3 (three) times daily. 02/07/24   [provider]  midodrine  (PROAMATINE ) 10 MG tablet Take 1 tablet (10 mg total) by mouth 3 (three) times daily with meals. Increased from 2.5 mg. 05/09/24   [name], MD  nortriptyline  (PAMELOR ) 10 MG capsule Take 1 capsule by mouth at bedtime 04/08/24   [name], [name], DO  Omega-3 Fatty Acids (OMEGA 3 PO) Take 1,500 mg by mouth 2 (two) times daily.    [provider]  ondansetron  (ZOFRAN -ODT) 4 MG disintegrating tablet Take 1 tablet (4 mg total) by mouth every 8 (eight) hours as needed for nausea or vomiting (before eating). 04/25/24 04/20/25  [name], [name]  pantoprazole  (PROTONIX ) 40 MG tablet Take 1 tablet (40 mg total) by mouth 2 (two) times daily. Then go back to once a day Patient taking differently: Take 40 mg by mouth 2 (two) times daily. 04/25/24   [name], [name]  potassium chloride  (MICRO-K ) 10 MEQ CR capsule Take 1 capsule (10 mEq total) by mouth daily. 04/24/24   [name], [name], DO  rosuvastatin  (CRESTOR ) 40 MG tablet Take 1 tablet (40 mg total) by mouth daily. 04/06/23   [name], MD  Spacer/Aero-Holding [facility] Use as directed Dx: COPD, J44.9 07/13/23   Vicci Bouchard P, DO  spironolactone  (ALDACTONE ) 25 MG tablet Take 0.5 tablets (12.5 mg total) by mouth daily. 05/09/24   [name], MD  torsemide  (DEMADEX ) 20 MG tablet Take 1 tablet (20 mg total) by mouth daily. 03/21/24   Rolan Ezra RAMAN, MD  traZODone  (DESYREL ) 50 MG tablet Take 0.5 tablets (25 mg total) by mouth at bedtime as needed for sleep. 03/17/24   [name], [name], DO  VENTOLIN  HFA 108 (90 Base) MCG/ACT inhaler INHALE 2 PUFFS BY MOUTH EVERY 6 HOURS AS NEEDED FOR WHEEZING OR SHORTNESS OF BREATH 05/16/23   [name],  [name], DO  warfarin (COUMADIN ) 1 MG tablet Take 1 mg by mouth daily.    [provider]  warfarin (COUMADIN ) 3 MG tablet Take 3 mg by mouth daily.    [provider]    Physical Exam: Vitals:   05/10/24 0145 05/10/24 0200 05/10/24 0400 05/10/24 0410  BP:   ROLLEN)  140/80   Pulse: (!) 52 80 79   Resp: 18 16 (!) 22   Temp:    97.6 F (36.4 C)  TempSrc:    Oral  SpO2: (!) 84% 98% (!) 78%   Weight:      Height:        Physical Exam Vitals reviewed.  Constitutional:      General: He is not in acute distress. HENT:     Head: Normocephalic and atraumatic.  Eyes:     Extraocular Movements: Extraocular movements intact.  Cardiovascular:     Rate and Rhythm: Normal rate and regular rhythm.  Pulmonary:     Effort: Pulmonary effort is normal. No respiratory distress.  Abdominal:     General: Bowel sounds are normal.     Palpations: Abdomen is soft.     Tenderness: There is no abdominal tenderness. There is no guarding.  Musculoskeletal:     Cervical back: Normal range of motion.     Right lower leg: No edema.     Left lower leg: No edema.  Skin:    General: Skin is warm and dry.  Neurological:     General: No focal deficit present.     Mental Status: He is alert and oriented to person, place, and time.     Labs on Admission: I have personally reviewed following labs and imaging studies  CBC: Recent Labs  Lab 05/05/24 0114 05/06/24 0324 05/08/24 0215 05/09/24 1945  WBC 7.2 5.4 7.3 7.8  NEUTROABS 6.0  --   --  6.2  HGB 13.1 11.3* 12.1* 12.2*  HCT 40.7 36.1* 38.6* 38.0*  MCV 90.0 90.9 91.3 91.3  PLT 258 217 274 330   Basic Metabolic Panel: Recent Labs  Lab 05/05/24 0114 05/06/24 0324 05/07/24 0458 05/07/24 0826 05/08/24 0215 05/09/24 0318 05/09/24 1945  NA 136 142  --  137 140 140 139  K 3.8 4.0  --  3.2* 3.4* 4.7 3.5  CL 99 105  --  101 100 101 102  CO2 20* 28  --  24 26 28  20*  GLUCOSE 125* 77  --  111* 116* 93 125*  BUN 25* 22  --  21  23 24* 23  CREATININE 2.09* 1.67*  --  1.57* 1.61* 1.63* 1.85*  CALCIUM  8.8* 9.0  --  8.6* 8.9 8.7* 9.0  MG 1.8  --  2.2  --  2.0 2.2 1.7   GFR: Estimated Creatinine Clearance: 41.6 mL/min (A) (by C-G formula based on SCr of 1.85 mg/dL (H)). Liver Function Tests: Recent Labs  Lab 05/06/24 0324 05/07/24 0826 05/08/24 0215 05/09/24 0318 05/09/24 1945  AST 531* 208* 107* 64* 47*  ALT 301* 257* 196* 147* 121*  ALKPHOS 60 68 74 72 72  BILITOT 1.0 1.0 0.9 1.5* 0.7  PROT 6.2* 6.8 6.9 7.4 6.3*  ALBUMIN 2.9* 3.0* 3.1* 3.2* 3.1*   No results for input(s): LIPASE, AMYLASE in the last 168 hours. Recent Labs  Lab 05/05/24 0119  AMMONIA 24   Coagulation Profile: Recent Labs  Lab 05/06/24 0919 05/07/24 0502 05/08/24 0215 05/09/24 0318 05/09/24 1945  INR 1.4* 1.3* 1.3* 1.6* 2.2*   Cardiac Enzymes: Recent Labs  Lab 05/05/24 0129  CKTOTAL 241   BNP (last 3 results) No results for input(s): PROBNP in the last 8760 hours. HbA1C: No results for input(s): HGBA1C in the last 72 hours. CBG: Recent Labs  Lab 05/05/24 1251 05/05/24 2347 05/06/24 0735 05/06/24 1238 05/06/24 1639  GLUCAP 78 82  85 132* 106*   Lipid Profile: No results for input(s): CHOL, HDL, LDLCALC, TRIG, CHOLHDL, LDLDIRECT in the last 72 hours. Thyroid  Function Tests: No results for input(s): TSH, T4TOTAL, FREET4, T3FREE, THYROIDAB in the last 72 hours. Anemia Panel: No results for input(s): VITAMINB12, FOLATE, FERRITIN, TIBC, IRON, RETICCTPCT in the last 72 hours. Urine analysis:    Component Value Date/Time   COLORURINE YELLOW 05/09/2024 1838   APPEARANCEUR CLEAR 05/09/2024 1838   APPEARANCEUR Clear 05/16/2023 0849   LABSPEC 1.015 05/09/2024 1838   LABSPEC 1.005 05/24/2014 1157   PHURINE 5.0 05/09/2024 1838   GLUCOSEU >=500 (A) 05/09/2024 1838   GLUCOSEU Negative 05/24/2014 1157   HGBUR NEGATIVE 05/09/2024 1838   BILIRUBINUR NEGATIVE 05/09/2024 1838    BILIRUBINUR Negative 05/16/2023 0849   BILIRUBINUR Negative 05/24/2014 1157   KETONESUR NEGATIVE 05/09/2024 1838   PROTEINUR 30 (A) 05/09/2024 1838   NITRITE NEGATIVE 05/09/2024 1838   LEUKOCYTESUR NEGATIVE 05/09/2024 1838   LEUKOCYTESUR Negative 05/24/2014 1157    Radiological Exams on Admission: DG Knee Right Port Result Date: 05/09/2024 EXAM: 1 or 2 VIEW(S) XRAY OF THE KNEE 05/09/2024 08:44:00 PM COMPARISON: 03/26/2024 CLINICAL HISTORY: recent fall FINDINGS: BONES AND JOINTS: No acute fracture. No focal osseous lesion. No joint dislocation. No significant joint effusion. No significant degenerative changes. SOFT TISSUES: Femoropopliteal vascular calcifications noted. IMPRESSION: 1. No acute fracture or dislocation. Electronically signed by: Norman Gatlin MD 05/09/2024 08:49 PM EST RP Workstation: [id number]   DG Chest Portable 1 View Result Date: 05/09/2024 EXAM: 1 VIEW(S) XRAY OF THE CHEST 05/09/2024 08:43:00 PM COMPARISON: 05/05/2024 CLINICAL HISTORY: defib FINDINGS: LINES, TUBES AND DEVICES: Left chest wall cardiac device stable in position. LUNGS AND PLEURA: No focal pulmonary opacity. No pulmonary edema. No pleural effusion. No pneumothorax. HEART AND MEDIASTINUM: Persistent cardiomegaly. Aortic calcification. BONES AND SOFT TISSUES: Status post median sternotomy. No acute osseous abnormality. IMPRESSION: 1. Persistent cardiomegaly. Electronically signed by: Norman Gatlin MD 05/09/2024 08:48 PM EST RP Workstation: [id number]    Assessment and Plan  Syncope Patient denies preceding lightheadedness/dizziness, chest pain, shortness of breath.  He has no focal neurologic deficits on exam.  Wife reporting possible ICD shock.  Cardiology consulting and planning on interrogating his ICD.  Continue cardiac monitoring and check orthostatics.  EEG ordered given questionable seizure-like activity reported by wife.  Acute on chronic HFrEF BNP 749 (was 470 on labs 5 days ago).  Chest x-ray  without pulmonary edema.  Recent echo done 5 days ago showing EF <20%, RV systolic function mildly reduced, moderate mitral regurgitation, and moderate to severe tricuspid regurgitation.  Cardiology recommending holding off any diuresis at this time given low blood pressure at the time of arrival.  Since patient is mentating well and extremities are warm, cardiology does not think that he is in cardiogenic shock.  A-fib Continue warfarin and mexiletine.  Hold metoprolol  at this time given concern for low blood pressure on arrival.  History of mechanical aortic valve INR 2.2.  Pharmacy consulted for warfarin dosing.  CAD Troponin 47> 55, likely due to demand ischemia.  Patient is not endorsing chest pain.  Cardiology not suspecting ACS at this time.  CKD stage IIIa Creatinine currently 1.85 and baseline appears to be 1.4-1.6.  Continue to monitor renal function and avoid nephrotoxic agents.  Chronic hypotension Continue midodrine .  COPD Stable, no signs of acute exacerbation.  Continue home inhalers.  OSA Difficult to get accurate pulse ox readings as patient is having bilateral upper extremity tremors and per wife he  does have history of essential tremor.  Notified by ED RN that patient was not hypoxic when awake but when he fell asleep, he desatted to the mid 80s and placed on 3 L [place] with improvement of oxygen saturation.  Patient uses CPAP at home which has been ordered.  Gout Continue allopurinol .  Mood disorder Continue home medications.  GERD Continue PPI.  Chronic back pain Patient takes baclofen  at home and wife is requesting for this to be ordered.  Baclofen  ordered for now although consider discussing with patient/wife in the morning as it may be contributing to his frequent falls.  History of frequent falls PT/OT eval, fall precautions.  DVT prophylaxis: Warfarin Code Status: Full Code (discussed with the patient) Family Communication: Wife at bedside. Level of  care: Progressive Care Unit Admission status: It is my clinical opinion that referral for OBSERVATION is reasonable and necessary in this patient based on the above information provided. The aforementioned taken together are felt to place the patient at high risk for further clinical deterioration. However, it is anticipated that the patient may be medically stable for discharge from the hospital within 24 to 48 hours.  Editha Ram MD [facility]  If 7PM-7AM, please contact night-coverage [email or web address]  05/10/2024, 4:44 AM

## 2024-05-10 NOTE — Progress Notes (Signed)
 [facility] PROGRESS NOTE    Progress Note  Cola Conti  FMW:[id number] DOB: 06/30/1956 DOA: 05/09/2024 PCP: [name], DO    Brief Narrative:   Roberto [name] is an 68 y.o. male past medical history significant for aortic valve replacement on Coumadin , nonobstructive CAD, thoracic aortic aneurysm (followed by CT surgery as an outpatient), history of CVA TIA, chronic kidney disease stage III yea, he also has a history of renal infarct and HFrEF/biventricular failure complicated by VT status post CRT-D and severe right-sided heart failure, persistent atrial fibrillation, discharged in August 2025 for wide-complex tachycardia where his device was upgraded to a CRT-D, chronic hypotension on midodrine , recently discharged from [facility] for encephalopathy, acute on chronic HFrEF the day prior to admission.  Comes into the ED for syncopal evaluation followed by tonic-clonic movement with loss of consciousness for few seconds, there was no loss control of bowel or bladder.  Patient denies any prodromal symptoms.  Assessment/Plan:   Syncope and collapse: He denies any prodromal symptoms. He has no focal neurological deficit on physical exam. Wife reports possibly ICD shock. Cardiology was consulted as I am concerned that he got shock, in the ED not able to interrogate patient. Check orthostatics. EEG has been sent.  Acute on chronic HFrEF/severe right-sided heart failure/history of ventricular tachycardia with a CRT-D D in place: An echo done recently showed an EF of less than 20% with right RV systolic function reduced mitral and tricuspid regurgitation.   He appears euvolemic on physical exam no JVD or lower extremity edema. Cardiology was curb sided recommended holding off diuresis due to his blood pressure on arrival. On his previous admission the cardiologist related that the patient has a poor prognosis.  They spoke to the patient and wife extensively however the patient wanted  to go home. Go ahead and consult general cardiology. Will get palliative care involved as we have limited options.  Chronic atrial fibrillation on Coumadin : Holding metoprolol  for concerns of borderline blood pressure. He is currently rate controlled Goal INR 2.5-3.5  Mechanical aortic valve: Goal INR 2.5-3.5. Continue Coumadin  per pharmacy.  CAD: Slightly elevated troponins likely demand ischemia. The patient denies any chest pain.  Chronic kidney disease stage IIIa: Baseline creatinine 1.6.  Chronic hypotension: Continue midodrine .  COPD: Continue inhalers.  Obstructive sleep apnea: He was desatting in the 80s on admission so he was placed on 3 L of oxygen. Continue CPAP at night.  Gout: Continue allopurinol .  Mood disorder: Continue current home medications.  GERD: Continue PPI.  Chronic back pain: Baclofen  was restarted.  Frequent falls: PT OT has been consulted   DVT prophylaxis: Coumadin  Family Communication:none Status is: Observation The patient remains OBS appropriate and will d/c before 2 midnights.    Code Status:     Code Status Orders  (From admission, onward)           Start     Ordered   05/10/24 0559  Full code  Continuous       Question:  By:  Answer:  Consent: discussion documented in EHR   05/10/24 0601           Code Status History     Date Active Date Inactive Code Status Order ID Comments User Context   05/05/2024 0918 05/09/2024 1805 Full Code [id number]  Eldonna Elspeth PARAS, MD ED   04/30/2024 2259 05/02/2024 1704 Full Code [id number]  Mansy, Madison LABOR, MD ED   03/21/2024 1338 03/21/2024 1933 Full Code [id number]  Rolan Barrack  S, MD Inpatient   03/07/2024 2043 03/09/2024 2212 Full Code [id number]  Arthea Child, MD ED   08/27/2023 1618 08/30/2023 1510 Full Code [id number]  Lesia Ozell Prentice DEVONNA ED   06/22/2023 1658 06/26/2023 2046 Full Code [id number]  Colletta Manuelita Nat DEVONNA Inpatient   06/17/2023 2313 06/22/2023  1658 Full Code [id number]  Lawence Madison LABOR, MD ED   05/01/2016 1907 05/06/2016 1426 Full Code [id number]  Josette Ade, MD ED      Advance Directive Documentation    Flowsheet Row Most Recent Value  Type of Advance Directive Living will  Pre-existing out of facility DNR order (yellow form or pink MOST form) --  MOST Form in Place? --      IV Access:   Peripheral IV   Procedures and diagnostic studies:   DG Knee Right Port Result Date: 05/09/2024 EXAM: 1 or 2 VIEW(S) XRAY OF THE KNEE 05/09/2024 08:44:00 PM COMPARISON: 03/26/2024 CLINICAL HISTORY: recent fall FINDINGS: BONES AND JOINTS: No acute fracture. No focal osseous lesion. No joint dislocation. No significant joint effusion. No significant degenerative changes. SOFT TISSUES: Femoropopliteal vascular calcifications noted. IMPRESSION: 1. No acute fracture or dislocation. Electronically signed by: Norman Gatlin MD 05/09/2024 08:49 PM EST RP Workstation: [id number]   DG Chest Portable 1 View Result Date: 05/09/2024 EXAM: 1 VIEW(S) XRAY OF THE CHEST 05/09/2024 08:43:00 PM COMPARISON: 05/05/2024 CLINICAL HISTORY: defib FINDINGS: LINES, TUBES AND DEVICES: Left chest wall cardiac device stable in position. LUNGS AND PLEURA: No focal pulmonary opacity. No pulmonary edema. No pleural effusion. No pneumothorax. HEART AND MEDIASTINUM: Persistent cardiomegaly. Aortic calcification. BONES AND SOFT TISSUES: Status post median sternotomy. No acute osseous abnormality. IMPRESSION: 1. Persistent cardiomegaly. Electronically signed by: Norman Gatlin MD 05/09/2024 08:48 PM EST RP Workstation: [id number]     Medical Consultants:   None.   Subjective:    Alok Hirth denies any chest pain or shortness of breath  Objective:    Vitals:   05/10/24 0145 05/10/24 0200 05/10/24 0400 05/10/24 0410  BP:   (!) 140/80   Pulse: (!) 52 80 79   Resp: 18 16 (!) 22   Temp:    97.6 F (36.4 C)  TempSrc:    Oral  SpO2: (!) 84% 98% (!) 78%    Weight:      Height:       SpO2: (!) 78 %  No intake or output data in the 24 hours ending 05/10/24 0643 Filed Weights   05/09/24 1824  Weight: 99.6 kg    Exam: General exam: In no acute distress. Respiratory system: Good air movement and clear to auscultation. Cardiovascular system: S1 & S2 heard, RRR. No JVD. Gastrointestinal system: Abdomen is nondistended, soft and nontender.  Extremities: No pedal edema. Skin: No rashes, lesions or ulcers Psychiatry: Judgement and insight appear normal. Mood & affect appropriate.    Data Reviewed:    Labs: Basic Metabolic Panel: Recent Labs  Lab 05/05/24 0114 05/06/24 0324 05/07/24 0458 05/07/24 0826 05/08/24 0215 05/09/24 0318 05/09/24 1945  NA 136 142  --  137 140 140 139  K 3.8 4.0  --  3.2* 3.4* 4.7 3.5  CL 99 105  --  101 100 101 102  CO2 20* 28  --  24 26 28  20*  GLUCOSE 125* 77  --  111* 116* 93 125*  BUN 25* 22  --  21 23 24* 23  CREATININE 2.09* 1.67*  --  1.57* 1.61* 1.63* 1.85*  CALCIUM  8.8* 9.0  --  8.6*  8.9 8.7* 9.0  MG 1.8  --  2.2  --  2.0 2.2 1.7   GFR Estimated Creatinine Clearance: 41.6 mL/min (A) (by C-G formula based on SCr of 1.85 mg/dL (H)). Liver Function Tests: Recent Labs  Lab 05/06/24 0324 05/07/24 0826 05/08/24 0215 05/09/24 0318 05/09/24 1945  AST 531* 208* 107* 64* 47*  ALT 301* 257* 196* 147* 121*  ALKPHOS 60 68 74 72 72  BILITOT 1.0 1.0 0.9 1.5* 0.7  PROT 6.2* 6.8 6.9 7.4 6.3*  ALBUMIN 2.9* 3.0* 3.1* 3.2* 3.1*   No results for input(s): LIPASE, AMYLASE in the last 168 hours. Recent Labs  Lab 05/05/24 0119  AMMONIA 24   Coagulation profile Recent Labs  Lab 05/06/24 0919 05/07/24 0502 05/08/24 0215 05/09/24 0318 05/09/24 1945  INR 1.4* 1.3* 1.3* 1.6* 2.2*   COVID-19 Labs  No results for input(s): DDIMER, FERRITIN, LDH, CRP in the last 72 hours.  Lab Results  Component Value Date   SARSCOV2NAA NEGATIVE 05/05/2024   SARSCOV2NAA NEGATIVE 08/21/2023    SARSCOV2NAA NEGATIVE 03/25/2019   SARSCOV2NAA NEGATIVE 01/24/2019    CBC: Recent Labs  Lab 05/05/24 0114 05/06/24 0324 05/08/24 0215 05/09/24 1945  WBC 7.2 5.4 7.3 7.8  NEUTROABS 6.0  --   --  6.2  HGB 13.1 11.3* 12.1* 12.2*  HCT 40.7 36.1* 38.6* 38.0*  MCV 90.0 90.9 91.3 91.3  PLT 258 217 274 330   Cardiac Enzymes: Recent Labs  Lab 05/05/24 0129  CKTOTAL 241   BNP (last 3 results) No results for input(s): PROBNP in the last 8760 hours. CBG: Recent Labs  Lab 05/05/24 1251 05/05/24 2347 05/06/24 0735 05/06/24 1238 05/06/24 1639  GLUCAP 78 82 85 132* 106*   D-Dimer: No results for input(s): DDIMER in the last 72 hours. Hgb A1c: No results for input(s): HGBA1C in the last 72 hours. Lipid Profile: No results for input(s): CHOL, HDL, LDLCALC, TRIG, CHOLHDL, LDLDIRECT in the last 72 hours. Thyroid  function studies: No results for input(s): TSH, T4TOTAL, T3FREE, THYROIDAB in the last 72 hours.  Invalid input(s): FREET3 Anemia work up: No results for input(s): VITAMINB12, FOLATE, FERRITIN, TIBC, IRON, RETICCTPCT in the last 72 hours. Sepsis Labs: Recent Labs  Lab 05/05/24 0114 05/05/24 0119 05/05/24 0240 05/06/24 0324 05/08/24 0215 05/09/24 1945  WBC 7.2  --   --  5.4 7.3 7.8  LATICACIDVEN  --  3.5* 2.0*  --   --   --    Microbiology Recent Results (from the past 240 hours)  Culture, blood (routine x 2)     Status: None (Preliminary result)   Collection Time: 05/05/24  1:10 AM   Specimen: Right Antecubital; Blood  Result Value Ref Range Status   Specimen Description RIGHT ANTECUBITAL  Final   Special Requests   Final    BOTTLES DRAWN AEROBIC AND ANAEROBIC Blood Culture adequate volume   Culture   Final    NO GROWTH 4 DAYS Performed at [facility], [address], [place], [place] [address]    Report Status PENDING  Incomplete  Culture, blood (routine x 2)     Status: None (Preliminary result)    Collection Time: 05/05/24  1:14 AM   Specimen: Left Antecubital; Blood  Result Value Ref Range Status   Specimen Description LEFT ANTECUBITAL  Final   Special Requests   Final    BOTTLES DRAWN AEROBIC AND ANAEROBIC Blood Culture adequate volume   Culture   Final    NO GROWTH 4 DAYS Performed  at [facility], [address], [place], [place] [address]    Report Status PENDING  Incomplete  Resp panel by RT-PCR (RSV, Flu A&B, Covid) Anterior Nasal Swab     Status: None   Collection Time: 05/05/24  1:14 AM   Specimen: Anterior Nasal Swab  Result Value Ref Range Status   SARS Coronavirus 2 by RT PCR NEGATIVE NEGATIVE Final    Comment: (NOTE) SARS-CoV-2 target nucleic acids are NOT DETECTED.  The SARS-CoV-2 RNA is generally detectable in upper respiratory specimens during the acute phase of infection. The lowest concentration of SARS-CoV-2 viral copies this assay can detect is 138 copies/mL. A negative result does not preclude SARS-Cov-2 infection and should not be used as the sole basis for treatment or other patient management decisions. A negative result may occur with  improper specimen collection/handling, submission of specimen other than nasopharyngeal swab, presence of viral mutation(s) within the areas targeted by this assay, and inadequate number of viral copies(<138 copies/mL). A negative result must be combined with clinical observations, patient history, and epidemiological information. The expected result is Negative.  Fact Sheet for Patients:  bloggercourse.com  Fact Sheet for Healthcare Providers:  seriousbroker.it  This test is no t yet approved or cleared by the [place]  FDA and  has been authorized for detection and/or diagnosis of SARS-CoV-2 by FDA under an Emergency Use Authorization (EUA). This EUA will remain  in effect (meaning this test can be used) for the duration of the COVID-19 declaration  under Section 564(b)(1) of the Act, 21 U.S.C.section 360bbb-3(b)(1), unless the authorization is terminated  or revoked sooner.       Influenza A by PCR NEGATIVE NEGATIVE Final   Influenza B by PCR NEGATIVE NEGATIVE Final    Comment: (NOTE) The Xpert Xpress SARS-CoV-2/FLU/RSV plus assay is intended as an aid in the diagnosis of influenza from Nasopharyngeal swab specimens and should not be used as a sole basis for treatment. Nasal washings and aspirates are unacceptable for Xpert Xpress SARS-CoV-2/FLU/RSV testing.  Fact Sheet for Patients: bloggercourse.com  Fact Sheet for Healthcare Providers: seriousbroker.it  This test is not yet approved or cleared by the [place]  FDA and has been authorized for detection and/or diagnosis of SARS-CoV-2 by FDA under an Emergency Use Authorization (EUA). This EUA will remain in effect (meaning this test can be used) for the duration of the COVID-19 declaration under Section 564(b)(1) of the Act, 21 U.S.C. section 360bbb-3(b)(1), unless the authorization is terminated or revoked.     Resp Syncytial Virus by PCR NEGATIVE NEGATIVE Final    Comment: (NOTE) Fact Sheet for Patients: bloggercourse.com  Fact Sheet for Healthcare Providers: seriousbroker.it  This test is not yet approved or cleared by the [place]  FDA and has been authorized for detection and/or diagnosis of SARS-CoV-2 by FDA under an Emergency Use Authorization (EUA). This EUA will remain in effect (meaning this test can be used) for the duration of the COVID-19 declaration under Section 564(b)(1) of the Act, 21 U.S.C. section 360bbb-3(b)(1), unless the authorization is terminated or revoked.  Performed at [facility], [address] [place], [place] [address]      Medications:    allopurinol   100 mg Oral Daily   baclofen   10 mg Oral BID WC    budesonide-glycopyrrolate-formoterol   2 puff Inhalation BID   buPROPion   300 mg Oral Daily   enoxaparin  (LOVENOX ) injection  90 mg Subcutaneous Q12H   mexiletine  150 mg Oral TID   midodrine   10 mg Oral TID WC   nortriptyline   10 mg Oral QHS   pantoprazole   40 mg Oral BID   warfarin  5 mg Oral ONCE-1600   Warfarin - Pharmacist Dosing Inpatient   Does not apply q1600   Continuous Infusions:    LOS: 0 days   Erle Odell Castor  [facility]  05/10/2024, 6:43 AM

## 2024-05-10 NOTE — Consult Note (Cosign Needed Addendum)
 Consultation Note Date: 05/10/2024   Patient Name: Roberto [name]  DOB: [birth year]  MRN: [id number]  Age / Sex: 68 y.o., male  PCP: [name], DO Referring Physician: Odell Castor, Erle, MD  Reason for Consultation: Establishing goals of care  HPI/Patient Profile: 68 y.o. male  with past medical history of bicuspid aortic valve status post mechanical valve replacement on warfarin, nonobstructive CAD, thoracic aortic aneurysm followed by cardiothoracic surgery, CVA/TIA, CKD stage IIIa, right renal infarct, HFrEF complicated by VT status post ICD and subsequent upgrade to CRT-D, persistent A-fib, hospital admission in August 2025 for wide-complex tachycardia status post BiV pacemaker, chronic hypotension on midodrine , COPD, prediabetes, hyperlipidemia, OSA on CPAP, obesity (BMI 35.44).  Patient was recently admitted to [facility] for encephalopathy, generalized weakness/multiple falls, elevated LFTs, acute on chronic HFrEF, and acute hypoxemic respiratory failure.  CT head was negative for acute abnormality and ammonia level was normal.  He was diuresed with IV Lasix  40 mg twice daily.  He was discharged from the hospital yesterday.  Admitted on 05/09/2024 for evaluation of syncopal episode.   According to the history provided primarily by the patient's wife at the bedside, the patient has ongoing issues with his right knee, which frequently causes him to fall. Earlier this evening, while they were in the process of getting married at home, the patient experienced a syncopal episode. His wife reported that he was using his walker when his right knee gave out, leading to a fall. No head injury was noted. After someone assisted him in getting up, the patient reportedly began "convulsing" and then lost consciousness for a few seconds. In response, his wife administered five rescue breaths. Shortly thereafter, she observed what appeared to be an ICD shock, as his body jerked  suddenly.  Recently admitted at [facility] from 05/05/2024 to 05/09/2024 due to acute pulmonary edema.   The patient has had four inpatient admissions and three emergency department visits within the past six months.  PMT has been consulted to assist with goals of care conversation. Patient/Family face treatment option decisions, advanced directive decisions and anticipatory care needs.   Family face treatment option decision, advance directive decisions and anticipatory care needs.   Clinical Assessment and Goals of Care:  I have reviewed medical records including EPIC notes, labs and imaging, assessed the patient and then met with patient to discuss diagnosis prognosis, GOC, EOL wishes, disposition and options. I introduced Palliative Medicine as specialized medical care for people living with serious illness. It focuses on providing relief from the symptoms and stress of a serious illness. The goal is to improve quality of life for both the patient and the family.  I met with the patient today while he was still in ER Room 009. No family member at bedside. Patient mentioned that his wife just left.  He appeared chronically ill but was lying comfortably on the ED stretcher without any signs of acute distress. He was alert and oriented 3, able to express his needs clearly, and engaged in conversation appropriately.  We reviewed goals of care in the context of his multiple cardiac conditions. The patient demonstrated a clear understanding of the reason for his hospitalization. He recounted that yesterday, while walking, his right knee gave way, causing him to collapse, though he denies any loss of consciousness. He also understands that him having syncopal episode and frequent falling may be related to his extensive cardia issues. He voiced that he knows about his severe right-sided heart failure and other cardiac issues.  We discussed the progression of symptoms, frequent hospitalizations, and the limited  options for further disease-modifying therapy. The patient and family demonstrate understanding of the seriousness of the illness and the likelihood of continued decline. He remain hopeful for stabilization and improvement, but also expressed fear and sadness about the future.  I reviewed his code status, which is currently Full Code. I explained what full resuscitation entails, including associated risks and potential outcomes given his advanced disease state.Encouraged patient/family to consider DNR/DNI status understanding evidenced based poor outcomes in similar hospitalized patient, as the cause of arrest is likely associated with advanced chronic/terminal illness rather than an easily reversible acute cardio-pulmonary event. I explained that DNR/DNI does not change the medical plan and it only comes into effect after a person has arrested (died).  It is a protective measure to keep us  from harming the patient in their last moments of life. He voiced that he understands this however stated, "Please do everything you can to revive me when my heart stops beating or if I stop breathing."   The difference between aggressive medical intervention and comfort care was considered in light of the patient's goals of care. Hospice and Palliative Care services outpatient were explained and offered.   We reviewed goals of care in the context of his multiple cardiac conditions. The patient expressed his desire to continue all current medical interventions, including aggressive measures, in hopes of clinical improvement. He acknowledged his significant disease burden but stated that he wishes to pursue all available treatments to the fullest extent possible.  Shortly after, I contacted the patient's wife, [name], to introduce myself and reassure her that we are available for support as needed. I reviewed with her the discussion I had earlier with the patient regarding goals of care. [name] expressed full support for her  husband's decision to pursue all available treatments to the fullest extent possible in hopes of clinical improvement. I explained to [name] that the patient has significant and extensive cardiac disease, that medical therapies are limited, and that he is not a candidate for advanced interventions given his high disease burden. She acknowledged the seriousness of his condition but emphasized that the patient has the capacity to make his own decisions. She stated, "If he wishes to stay for a while and have some more time, and if he is willing to endure suffering with life-prolonging measures, then it should be respected." [name] also shared that the patient has faced multiple medical challenges in the past but has consistently overcome them, including a major setback in 2007 when he suffered an acute myocardial infarction, was comatose for several days, and ultimately recovered.  Created space and opportunity for family to explore thoughts and feelings regarding patient's current medical condition.   Discussed the importance of continued conversation with family and the medical providers regarding overall plan of care and treatment options, ensuring decisions are within the context of the patient's values and GOCs.   Questions and concerns were addressed.  Hard Choices booklet left for review. The family was encouraged to call with questions or concerns.  PMT will continue to support holistically.   Social History: He lives with his wife, [name], and noted that they were recently married, with the ceremony taking place yesterday in the hospital's ED. 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 He enjoys weightlifting and boxing.   Functional and Nutritional State: At baseline, he is ambulatory with the assistance  of a walker. Noted for frequent falls as per wife's report due to MSK issues.  though his appetite has been suboptimal due to recent  illness.  Palliative Symptoms: Generalized weakness  Advance Directives:  A detailed discussion regarding advance directives was completed. The patient has documented advance directives and a Health Care Power of Attorney [facility]) on file. The HCPOA document specifically states in the comments section: "Please resuscitate."  Code Status: Full code  Primary Decision Maker: Patient,  Suzen Custard (Wife) Chyrl Oshita   SUMMARY OF RECOMMENDATIONS    Code Status: Maintain Full Code per patient's desire Goal of care is medical stabilization and recovery to the extent this is possible Continue with full scope of care per patient/family's wish/desire Encouraged continued conversation with family and the medical providers regarding overall plan of care and treatment options, ensuring decisions are within the context of the patient's values and GOCs.  Continue to provide psycho-social and emotional support to patient and family Palliative medicine team will continue to follow.   Symptom Management: Per Primary team Palliative medicine is available to assist as needed.     Additional Recommendations (Limitations, Scope, Preferences): Full Scope Treatment  Psycho-social/Spiritual:  Desire for further Chaplaincy support:no  Prognosis:  Prognosis is guarded given significant cardiac problems, poor candidate for advanced therapies.  Discharge Planning: To Be Determined      Primary Diagnoses: Present on Admission:  Syncope  Atrial fibrillation (HCC)  Acute on chronic HFrEF (heart failure with reduced ejection fraction) (HCC)  COPD (chronic obstructive pulmonary disease) (HCC)     Physical Exam Vitals reviewed.  Constitutional:    He is not in acute distress. HENT:     Head: Normocephalic and atraumatic.  Eyes:     Extraocular Movements: Extraocular movements intact.  Cardiovascular:     Rate and Rhythm: Normal rate and regular rhythm.  Pulmonary:    No respiratory  distress.  Abdominal:     General: Bowel sounds are normal.  Musculoskeletal:  Normal range of motion.  Skin:    General: Skin is warm and dry.  Neurological:     General: No focal deficit present.     Mental Status: He is alert and oriented to person, place, and time.   Vital Signs: BP (!) 140/80   Pulse 79   Temp 97.6 F (36.4 C) (Oral)   Resp (!) 22   Ht 5' 6 (1.676 m)   Wt 99.6 kg   SpO2 (!) 78%   BMI 35.44 kg/m  Pain Scale: 0-10   Pain Score: Asleep   SpO2: 99% O2 Device:99% O2 Flow Rate: .    Palliative Assessment/Data: 50-60%    Total time: I spent 90 minutes in the care of the patient today in the above activities and documenting the encounter.   Detailed review of medical records (labs, imaging, vital signs), medically appropriate exam, discussed with treatment team, counseling and education to patient, family, & staff, documenting clinical information, coordination of care.     Kathlyne JULIANNA Tracie Mickey, NP  Palliative Medicine Team Team phone # [phone]  Thank you for allowing the Palliative Medicine Team to assist in the care of this patient. Please utilize secure chat with additional questions, if there is no response within 30 minutes please call the above phone number.  Palliative Medicine Team providers are available by phone from 7am to 7pm daily and can be reached through the team cell phone.  Should this patient require assistance outside of these hours, please call  the patient's attending physician.

## 2024-05-11 ENCOUNTER — Other Ambulatory Visit: Payer: Self-pay

## 2024-05-11 DIAGNOSIS — I5022 Chronic systolic (congestive) heart failure: Secondary | ICD-10-CM | POA: Diagnosis not present

## 2024-05-11 DIAGNOSIS — I4891 Unspecified atrial fibrillation: Secondary | ICD-10-CM | POA: Diagnosis not present

## 2024-05-11 DIAGNOSIS — I472 Ventricular tachycardia, unspecified: Secondary | ICD-10-CM | POA: Diagnosis not present

## 2024-05-11 DIAGNOSIS — Z4502 Encounter for adjustment and management of automatic implantable cardiac defibrillator: Secondary | ICD-10-CM

## 2024-05-11 DIAGNOSIS — R55 Syncope and collapse: Secondary | ICD-10-CM | POA: Diagnosis not present

## 2024-05-11 LAB — PROTIME-INR
INR: 2.7 — ABNORMAL HIGH (ref 0.8–1.2)
Prothrombin Time: 30.2 s — ABNORMAL HIGH (ref 11.4–15.2)

## 2024-05-11 LAB — BASIC METABOLIC PANEL WITH GFR
Anion gap: 13 (ref 5–15)
BUN: 21 mg/dL (ref 8–23)
CO2: 25 mmol/L (ref 22–32)
Calcium: 9.4 mg/dL (ref 8.9–10.3)
Chloride: 100 mmol/L (ref 98–111)
Creatinine, Ser: 1.79 mg/dL — ABNORMAL HIGH (ref 0.61–1.24)
GFR, Estimated: 41 mL/min — ABNORMAL LOW (ref >60)
Glucose, Bld: 102 mg/dL — ABNORMAL HIGH (ref 70–99)
Potassium: 3.6 mmol/L (ref 3.5–5.1)
Sodium: 138 mmol/L (ref 135–145)

## 2024-05-11 LAB — CBC
HCT: 38.4 % — ABNORMAL LOW (ref 39.0–52.0)
Hemoglobin: 12.1 g/dL — ABNORMAL LOW (ref 13.0–17.0)
MCH: 28.8 pg (ref 26.0–34.0)
MCHC: 31.5 g/dL (ref 30.0–36.0)
MCV: 91.4 fL (ref 80.0–100.0)
Platelets: 318 K/uL (ref 150–400)
RBC: 4.2 MIL/uL — ABNORMAL LOW (ref 4.22–5.81)
RDW: 18 % — ABNORMAL HIGH (ref 11.5–15.5)
WBC: 6.7 K/uL (ref 4.0–10.5)
nRBC: 0 % (ref 0.0–0.2)

## 2024-05-11 MED ORDER — WARFARIN SODIUM 5 MG PO TABS: 5.0000 mg | ORAL_TABLET | Freq: Once | ORAL | Status: DC

## 2024-05-11 MED ORDER — ACETAMINOPHEN 325 MG PO TABS
650.0000 mg | ORAL_TABLET | Freq: Three times a day (TID) | ORAL | Status: DC
  Administered 2024-05-11 – 2024-05-14 (×9): 650 mg via ORAL
  Filled 2024-05-11 (×9): qty 2

## 2024-05-11 MED ORDER — WARFARIN SODIUM 2 MG PO TABS
2.0000 mg | ORAL_TABLET | Freq: Once | ORAL | Status: AC
  Administered 2024-05-11: 2 mg via ORAL
  Filled 2024-05-11: qty 1

## 2024-05-11 NOTE — Progress Notes (Addendum)
 PHARMACY - ANTICOAGULATION CONSULT NOTE  Pharmacy Consult for warfarin Indication: mechanical aortic valve, afib  Allergies  Allergen Reactions   Latex Hives   Nicotine  Hives and Rash    Patches caused localized rash and hives     Patient Measurements: Height: 5' 6 (167.6 cm) Weight: 99.6 kg (219 lb 9.3 oz) IBW/kg (Calculated) : 63.8 HEPARIN  DW (KG): 85.7  Vital Signs: Temp: 98.7 F (37.1 C) (11/09 0729) Temp Source: Oral (11/09 0729) BP: 104/80 (11/09 0729) Pulse Rate: 80 (11/09 0729)  Labs: Recent Labs    05/09/24 0318 05/09/24 1945 05/09/24 2320 05/11/24 0758  HGB  --  12.2*  --  12.1*  HCT  --  38.0*  --  38.4*  PLT  --  330  --  318  LABPROT 20.3* 25.9*  --  30.2*  INR 1.6* 2.2*  --  2.7*  CREATININE 1.63* 1.85*  --  1.79*  TROPONINIHS  --  47* 55*  --     Estimated Creatinine Clearance: 43 mL/min (A) (by C-G formula based on SCr of 1.79 mg/dL (H)).   Medical History: Past Medical History:  Diagnosis Date   Aneurysm    Bicuspid aortic valve    a. s/p #27 Carbomedics mechanical valve on 03/25/2010; b. on Coumadin ; c. TTE 12/17: EF 40-45%, moderately dilated LV with moderate LVH, AVR well-seated with 14 mmHg gradient, peak AV velocity 2.5 m/s, mild mitral valve thickening with mild MR, mildly dilated RV with mildly reduced contraction   Cellulitis    CHF (congestive heart failure) (HCC)    Chronic kidney disease    Chronic systolic CHF (congestive heart failure) (HCC)    a. R/LHC 03/2010 showed no significant CAD, LVEDP 31 mmHg, mean AoV gradient 34 mmHg at rest and 47 mmHg with dobutamine 20 mcg/kg/min, AVA 1.0 cm^2, RA 31, RV 68/25, PA 68/47, PCWP 38. PA sat 65%. CO 6.2 L/min (Fick) and 5.3 L/min (thermodilution)   Clotting disorder    COPD (chronic obstructive pulmonary disease) (HCC)    H/O mechanical aortic valve replacement 03/25/2010   a. #27 Carbomedics mechanical valve   Hearing loss    High cholesterol    HTN (hypertension)     Hypercholesterolemia    Renal infarct 2017   Multiple right renal infarcts, likely embolic.   Stage 3 chronic kidney disease (HCC)    Stroke (HCC)    TIA (transient ischemic attack) 05/2014    Medications:  -Warfarin/lovenox  bridge: Per PCP note on 04/23/24, patient has recently been struggling to eat without vomiting and, therefore, struggling to maintain goal INR levels so the patient was switched from warfarin to enoxaparin  (03/25/24). Plan was to transition back to warfarin once patient could tolerate oral intake   -Patient was cycling through warfarin and dosing as 4mg  PO on one day, 4mg  PO the next, and then 7.5mg  PO and continuing cycle  Assessment: 47 yoM presented s/p fall and txr from [facility]. PMH includes mAVR (warfarin), non-obstructive CAD, thoracic aortic aneurysm, CVA, atrial fibrillation, HFrEF (LVEF 20%), Vtach s/p CRT-D, HLD. Pharmacy consulted to dose warfarin while patient in hospital.  -INR 2.7 -CBC shows Hgb 12s, plts 318 -Last dose of warfarin: 5mg  PO on 11/8 -Last dose of lovenox  bridge: 11/7 @2100   Date Dose  11/7 7.5mg  PO  11/8  5mg  PO  11/9 To give 2mg  PO    Patient INR is therapeutic for the first time today at 2.7 (goal 2.5-3.5), will discontinue lovenox , MD aware. Home amiodarone  200mg  BID was  restarted yesterday per EP which was being held in the setting of elevated LFTs since they are now downtrending. Since amiodarone  has the potential to interact with warfarin and increase INR, will give a dose of 2mg  warfarin today    Goal of Therapy:  INR 2.5-3.5 Monitor platelets by anticoagulation protocol: Yes   Plan:  -Give warfarin 2mg  PO x1 -Discontinue lovenox  since INR is at goal -INR daily -CBC daily   Thank you for allowing pharmacy to be involved with this patient's care.  Mendel Barter, PharmD PGY1 Clinical Pharmacist [facility]  05/11/2024 8:56 AM

## 2024-05-11 NOTE — Progress Notes (Signed)
 Palliative Medicine Inpatient Follow Up Note   HPI: 68 y.o. male  with past medical history of bicuspid aortic valve status post mechanical valve replacement on warfarin, nonobstructive CAD, thoracic aortic aneurysm followed by cardiothoracic surgery, CVA/TIA, CKD stage IIIa, right renal infarct, HFrEF complicated by VT status post ICD and subsequent upgrade to CRT-D, persistent A-fib, hospital admission in August 2025 for wide-complex tachycardia status post BiV pacemaker, chronic hypotension on midodrine , COPD, prediabetes, hyperlipidemia, OSA on CPAP, obesity (BMI 35.44).  Patient was recently admitted to [facility] for encephalopathy, generalized weakness/multiple falls, elevated LFTs, acute on chronic HFrEF, and acute hypoxemic respiratory failure.  CT head was negative for acute abnormality and ammonia level was normal.  He was diuresed with IV Lasix  40 mg twice daily.  He was discharged from the hospital yesterday.  Admitted on 05/09/2024 for evaluation of syncopal episode.    According to the history provided primarily by the patient's wife at the bedside, the patient has ongoing issues with his right knee, which frequently causes him to fall. Earlier this evening, while they were in the process of getting married at home, the patient experienced a syncopal episode. His wife reported that he was using his walker when his right knee gave out, leading to a fall. No head injury was noted. After someone assisted him in getting up, the patient reportedly began "convulsing" and then lost consciousness for a few seconds. In response, his wife administered five rescue breaths. Shortly thereafter, she observed what appeared to be an ICD shock, as his body jerked suddenly.   Recently admitted at [facility] from 05/05/2024 to 05/09/2024 due to acute pulmonary edema.    The patient has had four inpatient admissions and three emergency department visits within the past six months.   PMT has been consulted to assist with  goals of care conversation. Patient/Family face treatment option decisions, advanced directive decisions and anticipatory care needs. Worth to note that patient was also seen by [facility] PMT while he was recently admitted at [facility] for acute metabolic encephalopathy, acute on chronic hypotension.   Family face treatment option decision, advance directive decisions and anticipatory care needs.    Today's Discussion 05/11/2024  *Please note that this is a verbal dictation therefore any spelling or grammatical errors are due to the Dragon Medical One system interpretation.  Chart reviewed inclusive of vital signs, progress notes, laboratory results, and diagnostic images.   I met with the patient bedside today. Observed him to be sitting at the edge of his bed. He's alert and oriented times four, able to make needs known, and able to engage in a meaningful conversation with this nurse practitioner. He is not in any form of acute distress and denies acute pain and discomfort. He did mention that he has chronic lower back and knee pain that sometimes can become bothersome.   The RN reported that the patient became a little confused last night, but it was for a short time, and it got resolved immediately. The wife shared that this is very much common for the patient to get confused during the night. Otherwise, no significant event took place during the night. The patient mentioned to me that he is doing a lot better today compared to yesterday.  We revisited the reason for his hospital stay. We also already discussed the severity of his right-sided heart failure and other cardiac issues that make his prognosis poor because of limited advanced therapies. He mentioned that he has a good understanding of his  acute and chronic medical conditions. He understands that the nature of the disease is progressive and that he can face an uphill battle along the way. He also mentions that his life expectancy is decreased  given that his heart failure is end-stage.  We reviewed his goals of care, including the resuscitation status. Goals of care remain unchanged. We rediscussed and I provided education about the code status given his significant comorbidities, that full code is not recommended and to consider DNR, DNI. The patient mentioned to me that he understood what we discussed yesterday about his prognosis and about code status/resuscitation. But despite this, he wants to remain full code, and he wants aggressive medical interventions to be employed in the event that he will decompensate to buy for some more time. I shared with the patient that I also spoke with his wife yesterday afternoon and that she supports whatever medical decision the patient decides to have.   I mentioned to the patient that I worry and I'm concerned about his medical problem and the high risk for decompensation and long-term poor prognosis. I offered information about the natural or expected trajectory of end-stage heart failure.  Goals of care are clear: continue with full code, full scope, and provision of medical interventions to the extent possible for clinical stabilization.  Created space and opportunity for patient to explore thoughts feelings and fears regarding current medical situation.  Patient and her family face treatment option decisions, advanced directive decisions and anticipatory care needs.     Questions and concerns addressed   Palliative Support Provided.   Objective Assessment: Vital Signs Vitals:   05/11/24 0729 05/11/24 0914  BP: 104/80   Pulse: 80 80  Resp: 18 18  Temp: 98.7 F (37.1 C)   SpO2: 95%     Intake/Output Summary (Last 24 hours) at 05/11/2024 1127 Last data filed at 05/10/2024 1851 Gross per 24 hour  Intake 120 ml  Output --  Net 120 ml   Last Weight  Most recent update: 05/09/2024  6:25 PM    Weight  99.6 kg (219 lb 9.3 oz)             Physical Exam Vitals reviewed.   Constitutional:    He is not in acute distress. HENT:     Head: Normocephalic and atraumatic.  Eyes:     Extraocular Movements: Extraocular movements intact.  Cardiovascular:     Rate and Rhythm: Normal rate and regular rhythm.  Pulmonary:    No respiratory distress.  Abdominal:     General: Bowel sounds are normal.  Musculoskeletal:  Normal range of motion.  Skin:    General: Skin is warm and dry.  Neurological:     General: No focal deficit present.     Mental Status: He is alert and oriented to person, place, and time.   SUMMARY OF RECOMMENDATIONS    Code Status: Maintain Full Code per patient's desire Goal of care is medical stabilization and recovery to the extent this is possible Continue with full scope of care per patient/family's wish/desire Encouraged continued conversation with family and the medical providers regarding overall plan of care and treatment options, ensuring decisions are within the context of the patient's values and GOCs.  Continue to provide psycho-social and emotional support to patient and family Palliative medicine team will continue to follow.    Symptom Management: Per Primary team Changed Acetaminophen  PRN to scheduled TID.  Palliative medicine is available to assist as needed.  Time Spent: 35 minutes  Detailed review of medical records (labs, imaging, vital signs), medically appropriate exam, discussed with treatment team, counseling and education to patient, family, & staff, documenting clinical information, coordination of care.   ______________________________________________________________________________________ Kathlyne Bolder NP-C [place] Palliative Medicine Team Team Cell Phone: [phone] Please utilize secure chat with additional questions, if there is no response within 30 minutes please call the above phone number  Palliative Medicine Team providers are available by phone from 7am to 7pm daily and can be reached  through the team cell phone.  Should this patient require assistance outside of these hours, please call the patient's attending physician.

## 2024-05-11 NOTE — Progress Notes (Signed)
 [facility] PROGRESS NOTE    Progress Note  Roberto [name]  FMW:[id number] DOB: February 13, 1956 DOA: 05/09/2024 PCP: [name], DO    Brief Narrative:   Roberto [name] is an 68 y.o. male past medical history significant for aortic valve replacement on Coumadin , nonobstructive CAD, thoracic aortic aneurysm (followed by CT surgery as an outpatient), history of CVA TIA, chronic kidney disease stage III yea, he also has a history of renal infarct and HFrEF/biventricular failure complicated by VT status post CRT-D and severe right-sided heart failure, persistent atrial fibrillation, discharged in August 2025 for wide-complex tachycardia where his device was upgraded to a CRT-D, chronic hypotension on midodrine , recently discharged from [facility] for encephalopathy, acute on chronic HFrEF the day prior to admission.  Comes into the ED for syncopal evaluation followed by tonic-clonic movement with loss of consciousness for few seconds, there was no loss control of bowel or bladder.  Patient denies any prodromal symptoms.  EEG and orthostatics were negative.  Assessment/Plan:    Syncope and collapse likely due to VT's/chronic HFrEF/severe right-sided heart failure/history of ventricular tachycardia with a CRT-D D in place: Pacer was interrogated did receive multiple ATP's and 1 shock in the afternoon. An echo done recently showed an EF of less than 20% with right RV systolic function reduced mitral and tricuspid regurgitation.   He appears euvolemic on physical exam no JVD or lower extremity edema. EP was consulted recommended start amiodarone , continue mexiletine and continue Coumadin . He is unable to tolerate GDMT due to hypotension. Consult the advanced heart failure team as recommended by EP. On his previous admission the cardiologist related that the patient has a poor prognosis. Palliative care met with family and they would like to continue aggressive management.  Chronic atrial  fibrillation on Coumadin : Holding metoprolol  for concerns of borderline blood pressure. He is currently rate controlled Goal INR 2.5-3.5  Mechanical aortic valve: Goal INR 2.5-3.5. Continue Coumadin  per pharmacy.  CAD/elevated troponins: Slightly elevated of basically remained flat The patient denies any chest pain.  Chronic kidney disease stage IIIa: Baseline creatinine 1.6. Slight elevation to 1.8, basic metabolic panels pending this morning. Avoid hypotension and nephrotoxic agents  Chronic hypotension: Continue midodrine .  COPD: Continue inhalers.  Obstructive sleep apnea: He was desatting in the 80s on admission so he was placed on 3 L of oxygen. Continue CPAP at night.  Gout: Continue allopurinol .  Mood disorder: Continue current home medications.  GERD: Continue PPI.  Chronic back pain: Baclofen  was restarted.  Frequent falls: PT OT has been consulted   DVT prophylaxis: Coumadin  Family Communication:none Status is: Observation The patient remains OBS appropriate and will d/c before 2 midnights.    Code Status:     Code Status Orders  (From admission, onward)           Start     Ordered   05/10/24 0559  Full code  Continuous       Question:  By:  Answer:  Consent: discussion documented in EHR   05/10/24 0601           Code Status History     Date Active Date Inactive Code Status Order ID Comments User Context   05/05/2024 0918 05/09/2024 1805 Full Code [id number]  Eldonna Elspeth PARAS, MD ED   04/30/2024 2259 05/02/2024 1704 Full Code [id number]  Mansy, Madison LABOR, MD ED   03/21/2024 1338 03/21/2024 1933 Full Code [id number]  Rolan Ezra RAMAN, MD Inpatient   03/07/2024 2043 03/09/2024 2212 Full Code [id number]  Arthea Child, MD ED   08/27/2023 1618 08/30/2023 1510 Full Code [id number]  Lesia Ozell Prentice DEVONNA ED   06/22/2023 1658 06/26/2023 2046 Full Code [id number]  Colletta Manuelita Nat DEVONNA Inpatient   06/17/2023 2313 06/22/2023 1658 Full  Code [id number]  Lawence Madison LABOR, MD ED   05/01/2016 1907 05/06/2016 1426 Full Code [id number]  Josette Ade, MD ED      Advance Directive Documentation    Flowsheet Row Most Recent Value  Type of Advance Directive Living will  Pre-existing out of facility DNR order (yellow form or pink MOST form) --  MOST Form in Place? --      IV Access:   Peripheral IV   Procedures and diagnostic studies:   EEG adult Result Date: 05/10/2024 Shelton Arlin [name], MD     05/10/2024  6:07 PM Patient Name: Roberto [name] MRN: [id number] Epilepsy Attending: Arlin [name] Shelton Referring Physician/Provider: Alfornia Madison, MD Date: 05/10/2024 Duration: 22.56 mins Patient history: 68yo M with syncope. EEG to evaluate for seizure Level of alertness: Awake AEDs during EEG study: None Technical aspects: This EEG study was done with scalp electrodes positioned according to the 10-20 International system of electrode placement. Electrical activity was reviewed with band pass filter of 1-70Hz , sensitivity of 7 uV/mm, display speed of 53mm/sec with a 60Hz  notched filter applied as appropriate. EEG data were recorded continuously and digitally stored.  Video monitoring was available and reviewed as appropriate. Description: The posterior dominant rhythm consists of 8-9Hz  activity of moderate voltage (25-35 uV) seen predominantly in posterior head regions, symmetric and reactive to eye opening and eye closing. Drowsiness was characterized by attenuation of the posterior background rhythm. Hyperventilation and photic stimulation were not performed.   IMPRESSION: This study is within normal limits. No seizures or epileptiform discharges were seen throughout the recording. A normal interictal EEG does not exclude the diagnosis of epilepsy. Arlin [name] Shelton   DG Knee Right Port Result Date: 05/09/2024 EXAM: 1 or 2 VIEW(S) XRAY OF THE KNEE 05/09/2024 08:44:00 PM COMPARISON: 03/26/2024 CLINICAL HISTORY: recent fall FINDINGS:  BONES AND JOINTS: No acute fracture. No focal osseous lesion. No joint dislocation. No significant joint effusion. No significant degenerative changes. SOFT TISSUES: Femoropopliteal vascular calcifications noted. IMPRESSION: 1. No acute fracture or dislocation. Electronically signed by: Norman Gatlin MD 05/09/2024 08:49 PM EST RP Workstation: [id number]   DG Chest Portable 1 View Result Date: 05/09/2024 EXAM: 1 VIEW(S) XRAY OF THE CHEST 05/09/2024 08:43:00 PM COMPARISON: 05/05/2024 CLINICAL HISTORY: defib FINDINGS: LINES, TUBES AND DEVICES: Left chest wall cardiac device stable in position. LUNGS AND PLEURA: No focal pulmonary opacity. No pulmonary edema. No pleural effusion. No pneumothorax. HEART AND MEDIASTINUM: Persistent cardiomegaly. Aortic calcification. BONES AND SOFT TISSUES: Status post median sternotomy. No acute osseous abnormality. IMPRESSION: 1. Persistent cardiomegaly. Electronically signed by: Norman Gatlin MD 05/09/2024 08:48 PM EST RP Workstation: [id number]     Medical Consultants:   None.   Subjective:    Roberto [name] denies any chest pain or shortness of breath.  Objective:    Vitals:   05/10/24 2023 05/10/24 2056 05/10/24 2327 05/11/24 0650  BP: 100/63 100/63 (!) 129/100 97/71  Pulse:  80    Resp: 19 18 (!) 21 19  Temp: 98.3 F (36.8 C)  98.3 F (36.8 C) 98.5 F (36.9 C)  TempSrc: Oral  Oral Oral  SpO2: 97% 98% 98% 96%  Weight:      Height:       SpO2: 96 % O2 Flow Rate (L/min):  1 L/min   Intake/Output Summary (Last 24 hours) at 05/11/2024 9287 Last data filed at 05/10/2024 1851 Gross per 24 hour  Intake 120 ml  Output --  Net 120 ml   Filed Weights   05/09/24 1824  Weight: 99.6 kg    Exam: General exam: In no acute distress. Respiratory system: Good air movement and clear to auscultation. Cardiovascular system: S1 & S2 heard, RRR. No JVD. Gastrointestinal system: Abdomen is nondistended, soft and nontender.  Extremities: No pedal  edema. Skin: No rashes, lesions or ulcers Psychiatry: Judgement and insight appear normal. Mood & affect appropriate. Data Reviewed:    Labs: Basic Metabolic Panel: Recent Labs  Lab 05/05/24 0114 05/06/24 0324 05/07/24 0458 05/07/24 0826 05/08/24 0215 05/09/24 0318 05/09/24 1945  NA 136 142  --  137 140 140 139  K 3.8 4.0  --  3.2* 3.4* 4.7 3.5  CL 99 105  --  101 100 101 102  CO2 20* 28  --  24 26 28  20*  GLUCOSE 125* 77  --  111* 116* 93 125*  BUN 25* 22  --  21 23 24* 23  CREATININE 2.09* 1.67*  --  1.57* 1.61* 1.63* 1.85*  CALCIUM  8.8* 9.0  --  8.6* 8.9 8.7* 9.0  MG 1.8  --  2.2  --  2.0 2.2 1.7   GFR Estimated Creatinine Clearance: 41.6 mL/min (A) (by C-G formula based on SCr of 1.85 mg/dL (H)). Liver Function Tests: Recent Labs  Lab 05/06/24 0324 05/07/24 0826 05/08/24 0215 05/09/24 0318 05/09/24 1945  AST 531* 208* 107* 64* 47*  ALT 301* 257* 196* 147* 121*  ALKPHOS 60 68 74 72 72  BILITOT 1.0 1.0 0.9 1.5* 0.7  PROT 6.2* 6.8 6.9 7.4 6.3*  ALBUMIN 2.9* 3.0* 3.1* 3.2* 3.1*   No results for input(s): LIPASE, AMYLASE in the last 168 hours. Recent Labs  Lab 05/05/24 0119  AMMONIA 24   Coagulation profile Recent Labs  Lab 05/06/24 0919 05/07/24 0502 05/08/24 0215 05/09/24 0318 05/09/24 1945  INR 1.4* 1.3* 1.3* 1.6* 2.2*   COVID-19 Labs  No results for input(s): DDIMER, FERRITIN, LDH, CRP in the last 72 hours.  Lab Results  Component Value Date   SARSCOV2NAA NEGATIVE 05/05/2024   SARSCOV2NAA NEGATIVE 08/21/2023   SARSCOV2NAA NEGATIVE 03/25/2019   SARSCOV2NAA NEGATIVE 01/24/2019    CBC: Recent Labs  Lab 05/05/24 0114 05/06/24 0324 05/08/24 0215 05/09/24 1945  WBC 7.2 5.4 7.3 7.8  NEUTROABS 6.0  --   --  6.2  HGB 13.1 11.3* 12.1* 12.2*  HCT 40.7 36.1* 38.6* 38.0*  MCV 90.0 90.9 91.3 91.3  PLT 258 217 274 330   Cardiac Enzymes: Recent Labs  Lab 05/05/24 0129  CKTOTAL 241   BNP (last 3 results) No results for  input(s): PROBNP in the last 8760 hours. CBG: Recent Labs  Lab 05/05/24 1251 05/05/24 2347 05/06/24 0735 05/06/24 1238 05/06/24 1639  GLUCAP 78 82 85 132* 106*   D-Dimer: No results for input(s): DDIMER in the last 72 hours. Hgb A1c: No results for input(s): HGBA1C in the last 72 hours. Lipid Profile: No results for input(s): CHOL, HDL, LDLCALC, TRIG, CHOLHDL, LDLDIRECT in the last 72 hours. Thyroid  function studies: No results for input(s): TSH, T4TOTAL, T3FREE, THYROIDAB in the last 72 hours.  Invalid input(s): FREET3 Anemia work up: No results for input(s): VITAMINB12, FOLATE, FERRITIN, TIBC, IRON, RETICCTPCT in the last 72 hours. Sepsis Labs: Recent Labs  Lab 05/05/24 0114 05/05/24 0119 05/05/24  0240 05/06/24 0324 05/08/24 0215 05/09/24 1945  WBC 7.2  --   --  5.4 7.3 7.8  LATICACIDVEN  --  3.5* 2.0*  --   --   --    Microbiology Recent Results (from the past 240 hours)  Culture, blood (routine x 2)     Status: None   Collection Time: 05/05/24  1:10 AM   Specimen: Right Antecubital; Blood  Result Value Ref Range Status   Specimen Description RIGHT ANTECUBITAL  Final   Special Requests   Final    BOTTLES DRAWN AEROBIC AND ANAEROBIC Blood Culture adequate volume   Culture   Final    NO GROWTH 5 DAYS Performed at [facility], [address], [place], [place] [address]    Report Status 05/10/2024 FINAL  Final  Culture, blood (routine x 2)     Status: None   Collection Time: 05/05/24  1:14 AM   Specimen: Left Antecubital; Blood  Result Value Ref Range Status   Specimen Description LEFT ANTECUBITAL  Final   Special Requests   Final    BOTTLES DRAWN AEROBIC AND ANAEROBIC Blood Culture adequate volume   Culture   Final    NO GROWTH 5 DAYS Performed at [facility], [address] [place], [place] [address]    Report Status 05/10/2024 FINAL  Final  Resp panel by RT-PCR (RSV, Flu A&B, Covid) Anterior  Nasal Swab     Status: None   Collection Time: 05/05/24  1:14 AM   Specimen: Anterior Nasal Swab  Result Value Ref Range Status   SARS Coronavirus 2 by RT PCR NEGATIVE NEGATIVE Final    Comment: (NOTE) SARS-CoV-2 target nucleic acids are NOT DETECTED.  The SARS-CoV-2 RNA is generally detectable in upper respiratory specimens during the acute phase of infection. The lowest concentration of SARS-CoV-2 viral copies this assay can detect is 138 copies/mL. A negative result does not preclude SARS-Cov-2 infection and should not be used as the sole basis for treatment or other patient management decisions. A negative result may occur with  improper specimen collection/handling, submission of specimen other than nasopharyngeal swab, presence of viral mutation(s) within the areas targeted by this assay, and inadequate number of viral copies(<138 copies/mL). A negative result must be combined with clinical observations, patient history, and epidemiological information. The expected result is Negative.  Fact Sheet for Patients:  bloggercourse.com  Fact Sheet for Healthcare Providers:  seriousbroker.it  This test is no t yet approved or cleared by the [place]  FDA and  has been authorized for detection and/or diagnosis of SARS-CoV-2 by FDA under an Emergency Use Authorization (EUA). This EUA will remain  in effect (meaning this test can be used) for the duration of the COVID-19 declaration under Section 564(b)(1) of the Act, 21 U.S.C.section 360bbb-3(b)(1), unless the authorization is terminated  or revoked sooner.       Influenza A by PCR NEGATIVE NEGATIVE Final   Influenza B by PCR NEGATIVE NEGATIVE Final    Comment: (NOTE) The Xpert Xpress SARS-CoV-2/FLU/RSV plus assay is intended as an aid in the diagnosis of influenza from Nasopharyngeal swab specimens and should not be used as a sole basis for treatment. Nasal washings  and aspirates are unacceptable for Xpert Xpress SARS-CoV-2/FLU/RSV testing.  Fact Sheet for Patients: bloggercourse.com  Fact Sheet for Healthcare Providers: seriousbroker.it  This test is not yet approved or cleared by the [place]  FDA and has been authorized for detection and/or diagnosis of SARS-CoV-2 by FDA under an  Emergency Use Authorization (EUA). This EUA will remain in effect (meaning this test can be used) for the duration of the COVID-19 declaration under Section 564(b)(1) of the Act, 21 U.S.C. section 360bbb-3(b)(1), unless the authorization is terminated or revoked.     Resp Syncytial Virus by PCR NEGATIVE NEGATIVE Final    Comment: (NOTE) Fact Sheet for Patients: bloggercourse.com  Fact Sheet for Healthcare Providers: seriousbroker.it  This test is not yet approved or cleared by the [place]  FDA and has been authorized for detection and/or diagnosis of SARS-CoV-2 by FDA under an Emergency Use Authorization (EUA). This EUA will remain in effect (meaning this test can be used) for the duration of the COVID-19 declaration under Section 564(b)(1) of the Act, 21 U.S.C. section 360bbb-3(b)(1), unless the authorization is terminated or revoked.  Performed at [facility], [address] [place], [place] [address]      Medications:    allopurinol   100 mg Oral Daily   amiodarone   200 mg Oral BID   baclofen   10 mg Oral BID WC   budesonide-glycopyrrolate-formoterol   2 puff Inhalation BID   buPROPion   300 mg Oral Daily   enoxaparin  (LOVENOX ) injection  90 mg Subcutaneous Q12H   mexiletine  150 mg Oral TID   midodrine   10 mg Oral TID WC   nortriptyline   10 mg Oral QHS   pantoprazole   40 mg Oral BID   Warfarin - Pharmacist Dosing Inpatient   Does not apply q1600   Continuous Infusions:    LOS: 0 days   Roberto [name]  [facility]  05/11/2024, 7:12 AM

## 2024-05-11 NOTE — Care Management Obs Status (Signed)
 MEDICARE OBSERVATION STATUS NOTIFICATION   Patient Details  Name: Roberto [name] MRN: [id number] Date of Birth: 01-13-1956   Medicare Observation Status Notification Given:  Yes    Marval Gell, RN 05/11/2024, 3:35 PM

## 2024-05-11 NOTE — Evaluation (Signed)
 Occupational Therapy Evaluation Patient Details Name: Roberto [name] MRN: [id number] DOB: 02-21-1956 Today's Date: 05/11/2024   History of Present Illness   Pt is a 68 y.o. male presented 05/09/24 due to syncope (discharged from [facility] same day). ?ICD shock; PMH of mechanical heart valve on anticoagulation, heart failure, type II diabetic, CKD, liver disease, hypertension, dyslipidemia, CVA/TIA, chronic HFrEF, s/p ICD, hypotension on midodrine , COPD, obesity, essential tremor     Clinical Impressions Pt greeted seated EOB, agreeable for OT visit. Very pleasant and participatory, mildly forgetful. RN reports pt was disoriented overnight. Pt reports thinking he woke up in [place] . PTA, pt was living with his wife and roommate and was indep with BADLs, has assist for medication/financial mgmt and household chores. Has been using a RW recently for mobility. Today, he required no more than CGA for all transfers & functional mobility via RW. Completed UB ADLs with SBA and up to min A for LB ADLs. Mildly fatigued at end of session, VSS on RA.   Pt would benefit from acute OT services to facilitate return to PLOF and safe d/c, although do not anticipate any post-acute OT needs. Will con't to follow while in house.     If plan is discharge home, recommend the following:   A little help with walking and/or transfers;A little help with bathing/dressing/bathroom;Help with stairs or ramp for entrance     Functional Status Assessment   Patient has had a recent decline in their functional status and demonstrates the ability to make significant improvements in function in a reasonable and predictable amount of time.     Equipment Recommendations   None recommended by OT (pt has adequate DME)     Recommendations for Other Services         Precautions/Restrictions   Precautions Precautions: Fall Recall of Precautions/Restrictions: Intact Restrictions Weight Bearing Restrictions Per  Provider Order: No     Mobility Bed Mobility               General bed mobility comments: pt received seated EOB and left upright in chair    Transfers Overall transfer level: Needs assistance Equipment used: Rolling walker (2 wheels) Transfers: Sit to/from Stand Sit to Stand: Supervision           General transfer comment: Stood from bed height with cues for hand placement      Balance Overall balance assessment: Needs assistance Sitting-balance support: Feet supported, Single extremity supported Sitting balance-Leahy Scale: Good Sitting balance - Comments: seated EOB, occasionally holding onto bed rail for support   Standing balance support: Bilateral upper extremity supported, During functional activity, Reliant on assistive device for balance Standing balance-Leahy Scale: Fair Standing balance comment: reliant on RW during mobility, CGA for safety/stability                           ADL either performed or assessed with clinical judgement   ADL Overall ADL's : Needs assistance/impaired Eating/Feeding: Independent   Grooming: Contact guard assist;Standing;Wash/dry hands;Wash/dry face               Lower Body Dressing: Minimal assistance;Sitting/lateral leans Lower Body Dressing Details (indicate cue type and reason): figure four method to adjust B socks, incr time/effort to complete Toilet Transfer: Contact guard assist;Ambulation;Rolling walker (2 wheels);Grab bars Toilet Transfer Details (indicate cue type and reason): cued to use grab bar to assist in standing from toilet Toileting- Clothing Manipulation and Hygiene: Contact guard assist  Functional mobility during ADLs: Contact guard assist;Rolling walker (2 wheels)       Vision Ability to See in Adequate Light: 0 Adequate Patient Visual Report: No change from baseline       Perception         Praxis         Pertinent Vitals/Pain Pain Assessment Pain Assessment:  No/denies pain Pain Score: 0-No pain     Extremity/Trunk Assessment Upper Extremity Assessment Upper Extremity Assessment: Generalized weakness   Lower Extremity Assessment Lower Extremity Assessment: Defer to PT evaluation       Communication Communication Communication: Impaired Factors Affecting Communication: Hearing impaired   Cognition Arousal: Alert Behavior During Therapy: WFL for tasks assessed/performed Cognition: Cognition impaired   Orientation impairments: Time (RN reports pt disoriented overnight, pt stating it was 10 at night despite windows opened and sun shining; re-oriented appropriately) Awareness: Online awareness impaired Memory impairment (select all impairments): Short-term memory Attention impairment (select first level of impairment): Alternating attention   OT - Cognition Comments: per RN, pt disoriented overnight; pt endorses thinking he woke up in [place] ; edu provided re: delirium prevention strategies                 Following commands: Intact       Cueing  General Comments   Cueing Techniques: Verbal cues  HR 80s throughout, BP 122/80 (86) with pt tolerating well   Exercises     Shoulder Instructions      Home Living Family/patient expects to be discharged to:: Private residence Living Arrangements: Spouse/significant other;Non-relatives/Friends (friend/roommate) Available Help at Discharge: Family;Available PRN/intermittently Type of Home: House Home Access: Stairs to enter [employer] of Steps: 1 Entrance Stairs-Rails: None Home Layout: Two level;Able to live on main level with bedroom/bathroom     Bathroom Shower/Tub: Sponge bathes at baseline   Bathroom Toilet: Standard     Home Equipment: Rollator (4 wheels)          Prior Functioning/Environment Prior Level of Function : Independent/Modified Independent;History of Falls (last six months)             Mobility Comments: Since Aug when he had  his pacemaker replaced he has utilized a occupational hygienist; 3 falls in the last 6 months--fell off steps, and 2 in the yard, denies major injuries from falling ADLs Comments: friend/roommate cooks, does laundry, helps with meds; pt mostly IND with ADLs; sponge bathes    OT Problem List: Decreased strength;Decreased activity tolerance;Impaired balance (sitting and/or standing);Obesity   OT Treatment/Interventions: Self-care/ADL training;Energy conservation;DME and/or AE instruction;Cognitive remediation/compensation;Patient/family education;Balance training      OT Goals(Current goals can be found in the care plan section)   Acute Rehab OT Goals Patient Stated Goal: get better and go home OT Goal Formulation: With patient Time For Goal Achievement: 05/25/24 Potential to Achieve Goals: Good   OT Frequency:  Min 1X/week    Co-evaluation              AM-PAC OT 6 Clicks Daily Activity     Outcome Measure Help from another person eating meals?: None Help from another person taking care of personal grooming?: A Little Help from another person toileting, which includes using toliet, bedpan, or urinal?: A Little Help from another person bathing (including washing, rinsing, drying)?: A Little Help from another person to put on and taking off regular upper body clothing?: None Help from another person to put on and taking off regular lower body clothing?: A Little 6 Click Score: 20  End of Session Equipment Utilized During Treatment: Rolling walker (2 wheels) Nurse Communication: Mobility status (pt status at end of session)  Activity Tolerance: Patient tolerated treatment well Patient left: in chair;with call bell/phone within reach;with chair alarm set  OT Visit Diagnosis: Other abnormalities of gait and mobility (R26.89);Repeated falls (R29.6);Other symptoms and signs involving cognitive function                Time: 8978-8960 OT Time Calculation (min): 18 min Charges:  OT General  Charges $OT Visit: 1 Visit OT Evaluation $OT Eval Low Complexity: 1 Low  Rikki CORDOBA MSOT, OTR/L Acute Rehabilitation Services [phone] Secure Chat Preferred  Rikki Milch 05/11/2024, 12:43 PM

## 2024-05-11 NOTE — Progress Notes (Signed)
 Progress Note  Patient Name: Roberto [name] Date of Encounter: 05/11/2024 [facility] Cardiologist: [name], MD   Interval Summary   Transferred from ED to [facility]. No acute overnight events. Patient reports feeling relatively well. No new or acute complaints.   Vital Signs Vitals:   05/10/24 2327 05/11/24 0650 05/11/24 0729 05/11/24 0914  BP: (!) 129/100 97/71 104/80   Pulse:   80 80  Resp: (!) 21 19 18 18   Temp: 98.3 F (36.8 C) 98.5 F (36.9 C) 98.7 F (37.1 C)   TempSrc: Oral Oral Oral   SpO2: 98% 96% 95%   Weight:      Height:        Intake/Output Summary (Last 24 hours) at 05/11/2024 1040 Last data filed at 05/10/2024 1851 Gross per 24 hour  Intake 120 ml  Output --  Net 120 ml      05/09/2024    6:24 PM 05/09/2024    5:00 AM 05/08/2024    4:37 AM  Last 3 Weights  Weight (lbs) 219 lb 9.3 oz 219 lb 9.3 oz 221 lb 9 oz  Weight (kg) 99.6 kg 99.6 kg 100.5 kg      Telemetry/ECG  BiV paced, no VAs - Personally Reviewed  Physical Exam  General: Chronically ill appearing, in no acute distress.  Neck: JVD elevated Cardiac: Normal rate, regular rhythm.  Resp: Normal work of breathing.  Ext: No edema.  Neuro: No gross focal deficits.  Psych: Normal affect.   Assessment & Plan  [name] is a 68 year old male with end-stage heart failure who presented to the ED following a syncopal event.  Device interrogation was performed and patient did receive multiple schemes of ATP and 1 device shock yesterday afternoon.   He initially had what device classified as SVT but at some point classification changed and he received ATP therapy for this and after multiple ATPs he accelerated into ventricular tachycardia and ultimately received a 36 J shock.  When comparing the electrograms available to other SVT episodes, it appears that he received inappropriate ATP for SVT.  Unfortunately, patient does not have an atrial lead which would allow for discrimination.  My  belief is that he was deemed permanent atrial fibrillation/flutter previously while at [facility] when his single-chamber ICD was upgraded to a CRT-D.    His ECG today does show P waves, which appears most consistent with sinus rhythm.  Addition of an atrial lead would improved discrimination and reduce inappropriate shocks. Conversely, given likelihood of recurrent AF/AFL then AVN ablation would be most reasonable and able to achieve the same purpose.  Ultimately however, patient does have end-stage heart failure and goals of care should be considered before pursuing any additional procedures/surgeries. For now, it appears the patient and family would like to continue with aggressive care.   #ICD shock #VT #Atrial fibrillation/flutter #Chronic systolic heart failure #Status post mechanical aortic valve   Plan: -Continue home amidoarone 200mg  BID.  This was held in setting of elevated LFTs, which was likely due to congestion.  LFTs are now downtrending.  Benefits of amiodarone  and suppressing arrhythmias and preventing ICD shocks likely outweigh risks at this point. Continue to closely monitor LFTs while admitted. -Continue mexiletine 150 mg TID.  -Continue warfarin in setting of mechanical aortic valve and atrial fibrillation/flutter. -Unable to tolerate GDMT due to hypotension.  He does not appear significantly decompensated from a heart failure perspective at this time.  Follows with advanced heart failure as outpatient.  Recommend consultation  of our heart failure colleagues while inpatient to assist with management, especially if palliative care is being considered.  -Will discuss potential AVN ablation vs addition of atrial lead with his primary EP, Dr. Waddell, tomorrow.   Signed, Fonda Kitty, MD

## 2024-05-12 ENCOUNTER — Other Ambulatory Visit: Payer: Self-pay

## 2024-05-12 DIAGNOSIS — I4821 Permanent atrial fibrillation: Secondary | ICD-10-CM

## 2024-05-12 DIAGNOSIS — I472 Ventricular tachycardia, unspecified: Secondary | ICD-10-CM | POA: Diagnosis not present

## 2024-05-12 DIAGNOSIS — Z515 Encounter for palliative care: Secondary | ICD-10-CM | POA: Diagnosis not present

## 2024-05-12 DIAGNOSIS — I5023 Acute on chronic systolic (congestive) heart failure: Secondary | ICD-10-CM | POA: Diagnosis not present

## 2024-05-12 DIAGNOSIS — R55 Syncope and collapse: Secondary | ICD-10-CM | POA: Diagnosis not present

## 2024-05-12 DIAGNOSIS — Z7189 Other specified counseling: Secondary | ICD-10-CM

## 2024-05-12 LAB — CBC
HCT: 38.1 % — ABNORMAL LOW (ref 39.0–52.0)
Hemoglobin: 11.8 g/dL — ABNORMAL LOW (ref 13.0–17.0)
MCH: 28.5 pg (ref 26.0–34.0)
MCHC: 31 g/dL (ref 30.0–36.0)
MCV: 92 fL (ref 80.0–100.0)
Platelets: 340 K/uL (ref 150–400)
RBC: 4.14 MIL/uL — ABNORMAL LOW (ref 4.22–5.81)
RDW: 17.8 % — ABNORMAL HIGH (ref 11.5–15.5)
WBC: 7.9 K/uL (ref 4.0–10.5)
nRBC: 0 % (ref 0.0–0.2)

## 2024-05-12 LAB — PROTIME-INR
INR: 2.8 — ABNORMAL HIGH (ref 0.8–1.2)
Prothrombin Time: 30.5 s — ABNORMAL HIGH (ref 11.4–15.2)

## 2024-05-12 LAB — BASIC METABOLIC PANEL WITH GFR
Anion gap: 13 (ref 5–15)
BUN: 21 mg/dL (ref 8–23)
CO2: 21 mmol/L — ABNORMAL LOW (ref 22–32)
Calcium: 9.1 mg/dL (ref 8.9–10.3)
Chloride: 107 mmol/L (ref 98–111)
Creatinine, Ser: 1.86 mg/dL — ABNORMAL HIGH (ref 0.61–1.24)
GFR, Estimated: 39 mL/min — ABNORMAL LOW (ref >60)
Glucose, Bld: 111 mg/dL — ABNORMAL HIGH (ref 70–99)
Potassium: 3.4 mmol/L — ABNORMAL LOW (ref 3.5–5.1)
Sodium: 141 mmol/L (ref 135–145)

## 2024-05-12 MED ORDER — SPIRONOLACTONE 12.5 MG HALF TABLET
12.5000 mg | ORAL_TABLET | Freq: Every day | ORAL | Status: DC
  Administered 2024-05-12: 12.5 mg via ORAL
  Filled 2024-05-12: qty 1

## 2024-05-12 MED ORDER — WARFARIN SODIUM 2 MG PO TABS
4.0000 mg | ORAL_TABLET | Freq: Once | ORAL | Status: AC
  Administered 2024-05-12: 4 mg via ORAL
  Filled 2024-05-12: qty 2

## 2024-05-12 MED ORDER — POTASSIUM CHLORIDE CRYS ER 20 MEQ PO TBCR
40.0000 meq | EXTENDED_RELEASE_TABLET | Freq: Once | ORAL | Status: AC
  Administered 2024-05-12: 40 meq via ORAL
  Filled 2024-05-12: qty 2

## 2024-05-12 MED ORDER — MIDODRINE HCL 5 MG PO TABS
5.0000 mg | ORAL_TABLET | Freq: Three times a day (TID) | ORAL | Status: DC
  Administered 2024-05-12: 5 mg via ORAL
  Filled 2024-05-12: qty 1

## 2024-05-12 MED ORDER — POTASSIUM CHLORIDE CRYS ER 20 MEQ PO TBCR: 40.0000 meq | EXTENDED_RELEASE_TABLET | Freq: Four times a day (QID) | ORAL | Status: DC

## 2024-05-12 MED ORDER — MAGNESIUM SULFATE 2 GM/50ML IV SOLN
2.0000 g | Freq: Once | INTRAVENOUS | Status: AC
  Administered 2024-05-12: 2 g via INTRAVENOUS
  Filled 2024-05-12: qty 50

## 2024-05-12 MED ORDER — FUROSEMIDE 10 MG/ML IJ SOLN: 80.0000 mg | Freq: Two times a day (BID) | INTRAMUSCULAR | Status: DC

## 2024-05-12 MED ORDER — FUROSEMIDE 10 MG/ML IJ SOLN
80.0000 mg | Freq: Two times a day (BID) | INTRAMUSCULAR | Status: DC
  Administered 2024-05-12 – 2024-05-13 (×3): 80 mg via INTRAVENOUS
  Filled 2024-05-12 (×4): qty 8

## 2024-05-12 MED ORDER — ROSUVASTATIN CALCIUM 20 MG PO TABS
40.0000 mg | ORAL_TABLET | Freq: Every day | ORAL | Status: DC
  Administered 2024-05-12 – 2024-05-14 (×3): 40 mg via ORAL
  Filled 2024-05-12 (×3): qty 2

## 2024-05-12 NOTE — Progress Notes (Signed)
 RT encouraged PT to wear CPAP, PT refused by shaking head no. CPAP at bedside.

## 2024-05-12 NOTE — Plan of Care (Signed)
  Problem: Clinical Measurements: Goal: Ability to maintain clinical measurements within normal limits will improve Outcome: Progressing Goal: Cardiovascular complication will be avoided Outcome: Progressing   Problem: Skin Integrity: Goal: Risk for impaired skin integrity will decrease Outcome: Progressing

## 2024-05-12 NOTE — Consult Note (Addendum)
 Advanced Heart Failure Team Consult Note   Primary Physician: [name], DO Cardiologist:  [name], MD  Reason for Consultation: Acute on chronic systolic CHF  HPI:    Roberto [name] is seen today for evaluation of acute on chronic systolic CHF at the request of Dr. Waddell with EP. 68 y.o. with history of bicuspid aortic valve disorder s/p mechanical AVR in 9/11 and 5.5 cm ascending aortic aneurysm, chronic systolic CHF, and smoking was initially referred by Dr. German for optimization of CHF prior to ascending aorta replacement surgery.  Echo in 7/20 showed EF 40-45% with stable mechanical aortic valve.  RHC/LHC in 7/20 showed nonobstructive CAD and R>L sided failure.     CHF has worsened over the last year.  Patient was admitted to [facility] in 12/24 with VT, was started on amiodarone .  Cardiac MRI in 12/24 showed LV EF 20%, RV EF 41%, mid-myocardial basal septal LGE (nonspecific non-coronary pattern).  RHC/LHC in 12/24 showed nonobstructive CAD, elevated filling pressures, CI 1.9.   He had a St Jude ICD placed. In 2/25, he was admitted with atrial fibrillation and had DCCV to NSR. Echo in 3/25 showed EF 25-30% with severe LV dilation, normal RV size with mild systolic dysfunction, mechanical aortic valve with mean gradient 13 mmHg.     CT chest in 3/25 showed 5.6 cm ascending aorta aneurysm.    He was admitted at [facility] in 8/25 with VT, device was upgraded to [name] CRT-D.  He was admitted to [facility] in 9/25 with nausea/vomiting/dehydration in setting of GLP-1 agonist use.  He had AKI.  Torsemide  was stopped.   RHC in 9/25 showed predominantly RV failure with mildly decreased CI by Fick (2.2) and preserved CI by thermo (2.38) with PAPi 1.5. Torsemide  restarted.   He was admitted at the end of October with acute metabolic encephalopathy, hypotension and hypokalemia. Etiology not certain but had been having nausea and vomiting prior to admission. GDMT scaled back. He was readmitted a  few days later with multiple falls, encephalopathy, acute on chronic CHF and hypotension. He was diuresed and GDMT again scaled back. Discharged on higher dose of midodrine . He was discharged on 11/07 so that he could he could get married.  The same afternoon he was shocked by his defibrillator during his wedding ceremony and lost consciousness resulting in a fall. He was seen by EP. It was felt that he may have received an inappropriate ATPs for SVT which degenerated into VT for which he received a shock. He was restarted on home does of amiodarone  and mexiletine.  There was plan to consider atrial lead vs AVJ ablation depending on his progress. Advanced Heart Failure consulted to assist with management of his heart failure.  Home Medications Prior to Admission medications   Medication Sig Start Date End Date Taking? Authorizing Provider  albuterol  (PROVENTIL ) (2.5 MG/3ML) 0.083% nebulizer solution USE 1 VIAL IN NEBULIZER EVERY 6 HOURS AS NEEDED FOR WHEEZING FOR SHORTNESS OF BREATH 09/25/23   [name], [name], DO  allopurinol  (ZYLOPRIM ) 100 MG tablet Take 1 tablet (100 mg total) by mouth daily. 03/10/24   [name], MD  amiodarone  (PACERONE ) 200 MG tablet Take 2 tablets by mouth twice daily 03/25/24   End, [name], MD  baclofen  (LIORESAL ) 10 MG tablet Take 1 tablet (10 mg total) by mouth 2 (two) times daily. Take before food twice a day, stop if not helping 04/25/24 06/24/24  [name], [name]  buPROPion  (WELLBUTRIN  XL)  300 MG 24 hr tablet Take 300 mg by mouth daily. 08/16/23   [provider]  dapagliflozin  propanediol (FARXIGA ) 10 MG TABS tablet Take 1 tablet (10 mg total) by mouth daily before breakfast. 03/19/24   End, [name], MD  enoxaparin  (LOVENOX ) 120 MG/0.8ML injection INJECT 0.72 ML (108MG ) INTO THE SKIN EVERY 12 HOURS 03/27/24   [name], [name], DO  feeding supplement (ENSURE PLUS HIGH PROTEIN) LIQD Take 237 mLs by mouth 2 (two) times daily between meals. 05/09/24    [name], MD  fenofibrate  (TRICOR ) 48 MG tablet Take 48 mg by mouth daily. 03/05/24   [provider]  Fluticasone -Umeclidin-Vilant (TRELEGY ELLIPTA ) 200-62.5-25 MCG/ACT AEPB Inhale 1 puff into the lungs daily in the afternoon. 02/22/24   [name], MD  metoprolol  succinate (TOPROL -XL) 25 MG 24 hr tablet Take 0.5 tablets (12.5 mg total) by mouth daily. Reduced from 50 mg daily. 05/09/24   [name], MD  mexiletine (MEXITIL ) 150 MG capsule Take 150 mg by mouth 3 (three) times daily. 02/07/24   [provider]  midodrine  (PROAMATINE ) 10 MG tablet Take 1 tablet (10 mg total) by mouth 3 (three) times daily with meals. Increased from 2.5 mg. 05/09/24   [name], MD  nortriptyline  (PAMELOR ) 10 MG capsule Take 1 capsule by mouth at bedtime 04/08/24   [name], [name], DO  Omega-3 Fatty Acids (OMEGA 3 PO) Take 1,500 mg by mouth 2 (two) times daily.    [provider]  ondansetron  (ZOFRAN -ODT) 4 MG disintegrating tablet Take 1 tablet (4 mg total) by mouth every 8 (eight) hours as needed for nausea or vomiting (before eating). 04/25/24 04/20/25  [name], [name]  pantoprazole  (PROTONIX ) 40 MG tablet Take 1 tablet (40 mg total) by mouth 2 (two) times daily. Then go back to once a day Patient taking differently: Take 40 mg by mouth 2 (two) times daily. 04/25/24   [name], [name]  potassium chloride  (MICRO-K ) 10 MEQ CR capsule Take 1 capsule (10 mEq total) by mouth daily. 04/24/24   [name], [name], DO  rosuvastatin  (CRESTOR ) 40 MG tablet Take 1 tablet (40 mg total) by mouth daily. 04/06/23   [name], MD  Spacer/Aero-Holding [name] Use as directed Dx: COPD, J44.9 07/13/23   Vicci Bouchard P, DO  spironolactone  (ALDACTONE ) 25 MG tablet Take 0.5 tablets (12.5 mg total) by mouth daily. 05/09/24   [name], MD  torsemide  (DEMADEX ) 20 MG tablet Take 1 tablet (20 mg total) by mouth daily. 03/21/24   [name], MD  traZODone  (DESYREL ) 50 MG tablet Take  0.5 tablets (25 mg total) by mouth at bedtime as needed for sleep. 03/17/24   [name], [name], DO  VENTOLIN  HFA 108 (90 Base) MCG/ACT inhaler INHALE 2 PUFFS BY MOUTH EVERY 6 HOURS AS NEEDED FOR WHEEZING OR SHORTNESS OF BREATH 05/16/23   [name], [name], DO  warfarin (COUMADIN ) 1 MG tablet Take 1 mg by mouth daily.    [provider]  warfarin (COUMADIN ) 3 MG tablet Take 3 mg by mouth daily.    [provider]    Past Medical History: Past Medical History:  Diagnosis Date   Aneurysm    Bicuspid aortic valve    a. s/p #27 [employer]omed[employer] mechanical valve on 03/25/2010; b. on Coumadin ; c. TTE 12/17: EF 40-45%, moderately dilated LV with moderate LVH, AVR well-seated with 14 mmHg gradient, peak AV velocity 2.5 m/s, mild mitral valve thickening with mild MR, mildly dilated RV  with mildly reduced contraction   Cellulitis    CHF (congestive heart failure) (HCC)    Chronic kidney disease    Chronic systolic CHF (congestive heart failure) (HCC)    a. R/LHC 03/2010 showed no significant CAD, LVEDP 31 mmHg, mean AoV gradient 34 mmHg at rest and 47 mmHg with dobutamine 20 mcg/kg/min, AVA 1.0 cm^2, RA 31, RV 68/25, PA 68/47, PCWP 38. PA sat 65%. CO 6.2 L/min (Fick) and 5.3 L/min (thermodilution)   Clotting disorder    COPD (chronic obstructive pulmonary disease) (HCC)    H/O mechanical aortic valve replacement 03/25/2010   a. 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   Stage 3 chronic kidney disease (HCC)    Stroke [facility])    TIA (transient ischemic attack) 05/2014    Past Surgical History: Past Surgical History:  Procedure Laterality Date   AORTIC VALVE REPLACEMENT     AORTIC VALVE SURGERY     CARDIAC CATHETERIZATION  03/21/2010   No significant CAD. Severe aortic stenosis. Severely elevated left and right heart filling pressures.    CARDIAC SURGERY  2009   CHF   CARDIOVERSION N/A 08/28/2023   Procedure: CARDIOVERSION;  Surgeon: [name] Slain, MD;  Location: [facility] INVASIVE CV LAB;  Service: Cardiovascular;  Laterality: N/A;   CARPAL TUNNEL RELEASE Left 2005   COLONOSCOPY N/A 12/13/2023   Procedure: COLONOSCOPY;  Surgeon: Suzann Inocente HERO, MD;  Location: [facility] ENDOSCOPY;  Service: Gastroenterology;  Laterality: N/A;   ICD IMPLANT N/A 06/25/2023   Procedure: ICD IMPLANT;  Surgeon: Waddell Danelle ORN, MD;  Location: [facility] INVASIVE CV LAB;  Service: Cardiovascular;  Laterality: N/A;   PACEMAKER IMPLANT     POLYPECTOMY  12/13/2023   Procedure: POLYPECTOMY, INTESTINE;  Surgeon: Suzann Inocente HERO, MD;  Location: [facility] ENDOSCOPY;  Service: Gastroenterology;;   RIGHT HEART CATH Right 03/21/2024   Procedure: RIGHT HEART CATH;  Surgeon: [name], MD;  Location: [facility] INVASIVE CV LAB;  Service: Cardiovascular;  Laterality: Right;   RIGHT HEART CATH AND CORONARY ANGIOGRAPHY N/A 01/28/2019   Procedure: RIGHT HEART CATH AND CORONARY ANGIOGRAPHY;  Surgeon: [name], MD;  Location: [facility] INVASIVE CV LAB;  Service: Cardiovascular;  Laterality: N/A;   RIGHT HEART CATH AND CORONARY ANGIOGRAPHY N/A 06/20/2023   Procedure: RIGHT HEART CATH AND CORONARY ANGIOGRAPHY;  Surgeon: [name], MD;  Location: [facility] INVASIVE CV LAB;  Service: Cardiovascular;  Laterality: N/A;   TONSILLECTOMY  1962    Family History: Family History  Problem Relation Age of Onset   Arthritis Mother    Dementia Mother    Colon cancer Mother    Arthritis Father    Diabetes Father    Stroke Father    Colon cancer Father    Heart attack Brother    Breast cancer Sister    Seizures Sister    Cancer Brother        brain   Heart disease Brother    Heart attack Brother     Social History: Social History   Socioeconomic History   Marital status: Divorced    Spouse name: Not on file   Number of children: 1   Years of education: 14   Highest education  level: Associate degree: occupational, scientist, product/process development, or vocational program  Occupational History   Occupation: Disabled    Employer: UNEMPLOYED  Tobacco Use  Smoking status: Every Day    Current packs/day: 0.20    Average packs/day: 0.5 packs/day for 48.4 years (23.8 ttl pk-yrs)    Types: Cigarettes    Start date: 38    Last attempt to quit: 2021   Smokeless tobacco: Never   Tobacco comments:    Caregiver reports patient currently smokes approximately 2 cigarettes/day.  Vaping Use   Vaping status: Never Used  Substance and Sexual Activity   Alcohol use: Never   Drug use: Never   Sexual activity: Not Currently  Other Topics Concern   Not on file  Social History Narrative   Not on file   Social Drivers of Health   Financial Resource Strain: High Risk (04/21/2024)   Overall Financial Resource Strain (CARDIA)    Difficulty of Paying Living Expenses: Hard  Food Insecurity: Food Insecurity Present (05/10/2024)   Hunger Vital Sign    Worried About Running Out of Food in the Last Year: Never true    Ran Out of Food in the Last Year: Sometimes true  Transportation Needs: No Transportation Needs (05/10/2024)   PRAPARE - Administrator, Civil Service (Medical): No    Lack of Transportation (Non-Medical): No  Recent Concern: Transportation Needs - Unmet Transportation Needs (05/01/2024)   PRAPARE - Administrator, Civil Service (Medical): Yes    Lack of Transportation (Non-Medical): No  Physical Activity: Inactive (04/21/2024)   Exercise Vital Sign    Days of Exercise per Week: 0 days    Minutes of Exercise per Session: Not on file  Stress: Stress Concern Present (04/21/2024)   [employer] of Occupational Health - Occupational Stress Questionnaire    Feeling of Stress: Very much  Social Connections: Socially Isolated (05/10/2024)   Social Connection and Isolation Panel    Frequency of Communication with Friends and Family: Never    Frequency of Social  Gatherings with Friends and Family: Never    Attends Religious Services: Never    Database Administrator or Organizations: No    Attends Banker Meetings: Never    Marital Status: Living with partner    Allergies:  Allergies  Allergen Reactions   Latex Hives   Nicotine  Hives and Rash    Patches caused localized rash and hives     Objective:    Vital Signs:   Temp:  [97.5 F (36.4 C)-99.2 F (37.3 C)] 97.9 F (36.6 C) (11/10 1132) Pulse Rate:  [64-82] 80 (11/10 1132) Resp:  [18-20] 20 (11/10 1132) BP: (95-128)/(68-95) 112/82 (11/10 1132) SpO2:  [90 %-98 %] 98 % (11/10 1132) Last BM Date : 05/09/24  Weight change: Filed Weights   05/09/24 1824  Weight: 99.6 kg    Intake/Output:   Intake/Output Summary (Last 24 hours) at 05/12/2024 1303 Last data filed at 05/12/2024 1238 Gross per 24 hour  Intake 600 ml  Output 100 ml  Net 500 ml      Physical Exam    General:  Chronically ill appearing, looks older than stated age. Cor: JVP to midneck. Regular rate & rhythm. No murmurs. Lungs: clear Abdomen: obese, soft, nontender, nondistended.  Extremities: 1-2+ edema Neuro: alert & orientedx3. Affect pleasant   Telemetry   V paced 80, ? Underlying atrial fibrillation  Labs   Basic Metabolic Panel: Recent Labs  Lab 05/07/24 0458 05/07/24 0826 05/08/24 0215 05/09/24 0318 05/09/24 1945 05/11/24 0758 05/12/24 0252  NA  --    < > 140 140 139 138 141  K  --    < > 3.4* 4.7 3.5 3.6 3.4*  CL  --    < > 100 101 102 100 107  CO2  --    < > 26 28 20* 25 21*  GLUCOSE  --    < > 116* 93 125* 102* 111*  BUN  --    < > 23 24* 23 21 21   CREATININE  --    < > 1.61* 1.63* 1.85* 1.79* 1.86*  CALCIUM   --    < > 8.9 8.7* 9.0 9.4 9.1  MG 2.2  --  2.0 2.2 1.7  --   --    < > = values in this interval not displayed.    Liver Function Tests: Recent Labs  Lab 05/06/24 0324 05/07/24 0826 05/08/24 0215 05/09/24 0318 05/09/24 1945  AST 531* 208* 107* 64* 47*   ALT 301* 257* 196* 147* 121*  ALKPHOS 60 68 74 72 72  BILITOT 1.0 1.0 0.9 1.5* 0.7  PROT 6.2* 6.8 6.9 7.4 6.3*  ALBUMIN 2.9* 3.0* 3.1* 3.2* 3.1*   No results for input(s): LIPASE, AMYLASE in the last 168 hours. No results for input(s): AMMONIA in the last 168 hours.  CBC: Recent Labs  Lab 05/06/24 0324 05/08/24 0215 05/09/24 1945 05/11/24 0758 05/12/24 0252  WBC 5.4 7.3 7.8 6.7 7.9  NEUTROABS  --   --  6.2  --   --   HGB 11.3* 12.1* 12.2* 12.1* 11.8*  HCT 36.1* 38.6* 38.0* 38.4* 38.1*  MCV 90.9 91.3 91.3 91.4 92.0  PLT 217 274 330 318 340    Cardiac Enzymes: No results for input(s): CKTOTAL, CKMB, CKMBINDEX, TROPONINI in the last 168 hours.  BNP: BNP (last 3 results) Recent Labs    03/19/24 1600 05/05/24 0114 05/09/24 1945  BNP 773.9* 470.9* 749.4*    ProBNP (last 3 results) No results for input(s): PROBNP in the last 8760 hours.   CBG: Recent Labs  Lab 05/05/24 2347 05/06/24 0735 05/06/24 1238 05/06/24 1639  GLUCAP 82 85 132* 106*    Coagulation Studies: Recent Labs    05/09/24 1945 05/11/24 0758 05/12/24 0252  LABPROT 25.9* 30.2* 30.5*  INR 2.2* 2.7* 2.8*    Medications:     Current Medications:  acetaminophen   650 mg Oral TID   allopurinol   100 mg Oral Daily   amiodarone   200 mg Oral BID   baclofen   10 mg Oral BID WC   budesonide-glycopyrrolate-formoterol   2 puff Inhalation BID   buPROPion   300 mg Oral Daily   mexiletine  150 mg Oral TID   midodrine   10 mg Oral TID WC   nortriptyline   10 mg Oral QHS   pantoprazole   40 mg Oral BID   warfarin  4 mg Oral ONCE-1600   Warfarin - Pharmacist Dosing Inpatient   Does not apply q1600    Infusions:  magnesium  sulfate bolus IVPB 2 g (05/12/24 1225)    Assessment/Plan    1. Acute on Chronic systolic CHF:  Nonischemic cardiomyopathy.  St Jude CRT-D device. Nonobstructive CAD on caths in 7/20 and 12/24. Cardiac MRI in 12/24 showed LV EF 20%, RV EF 41%, mid-myocardial basal  septal LGE (nonspecific non-coronary pattern).  Echo in 3/25 showed EF 25-30% with severe LV dilation, normal RV size with mild systolic dysfunction, mechanical aortic valve with mean gradient 13 mmHg.  BP has been running low with some orthostatic-type symptoms.  He has required midodrine  to keep up his BP.  RHC  9/25: w/ normal PCWP, predominant RV failure with PAPi 1.5, Fick CI 2.2, TD CI 2.38. Limited echo 11/25:  LVEF < 20%, RV moderately reduced, moderate MR, moderate to severe TR - Volume up on exam. Give 80 mg lasix  IV BID - GDMT has been limited by blood pressure. He is on midodrine  but think we have room to scale back dose, decrease to 5 mg TID - Start spiro 12.5 mg daily  - Try to add back Farxiga  tomorrow, previously stopped d/t AKI (? Volume depletion at the time). - Likely not a candidate for advanced therapies. Thoracic aortic aneurysm would also be a complicating issue. 2. Atrial fibrillation/flutter: DCCV in 2/25.     - Suspect inappropriate ATP for SVT on 11/7 leading to VT and inappropriate shock - EP considering AVN ablation vs atrial lead placement - Continue amiodarone  200 mg BID - Anticoagulated with Warfarin -? Underlying afib/flutter today, check ECG 3. VT s/p ICD shock - Continue amiodarone  200 mg BID and mexiletine 150 TID - LFTs elevated but suspect 2/2 hepatic congestion in setting of significant RV dysfunction. Also need to follow TSH, TSH 7.7 and Free T4 1.3 on 11/07 4. OSA: Moderate OSA, waiting for CPAP.  5. CAD: Nonobstructive on 7/20 and 12/24 caths.  I would aim for LDL at least < 70.  - Continue Crestor  40 mg daily.    6. Mechanical aortic valve: Stable function on 3/25 and 11/25 echoes.  Given history of CVA, should have Lovenox  bridging when he needs to hold warfarin.  Goal INR 2.5-3.5 with additional history of atrial fibrillation.   7. Smoking: Cessation recommended  8. Ascending aortic aneurysm: Suspect this is related to his bicuspid aortic valve.  Last  CT chest w/o contrast in 3/25 showed aneurysm 5.6 cm.  He would be high risk for repair.   9. Obesity: he tolerated GLP-1 agonist poorly with AKI/dehydration.  10. CKD IIIb: Scr baseline seems to be 1.7-1.9 - Scr 1.86 today  Length of Stay: 0  FINCH, LINDSAY N, [name]  05/12/2024, 1:03 PM    Advanced Heart Failure Team Pager [phone] (M-F; 7a - 5p)  Please contact [facility] for night-coverage after hours (4p -7a ) and weekends on [email or web address]   Patient seen with PA, I formulated the plan and agree with the above note.    Complex history as above.  Patient appears to have been re-admitted with inappropriate ICD shock in setting of SVT.  Echo this admission showed EF < 20%, moderate RV dysfunction with mild RV enlargement, mechanical aortic valve ok, IVC dilated.   Patient is seated at the side of the bed, says he feels ok.   On telemetry, he appears to be in atrial fibrillation with BiV pacing.   General: NAD Neck: JVP 14 cm, no thyromegaly or thyroid  nodule.  Lungs: Clear to auscultation bilaterally with normal respiratory effort. CV: Nondisplaced PMI.  Heart regular S1/S2 with mechanical S2, no S3/S4, 1/6 SEM RUSB.  1+ ankle edema.  No carotid bruit.  Normal pedal pulses.  Abdomen: Soft, nontender, no hepatosplenomegaly, no distention.  Skin: Intact without lesions or rashes.  Neurologic: Alert and oriented x 3.  Psych: Normal affect. Extremities: No clubbing or cyanosis.  HEENT: Normal.   1. Acute on chronic systolic CHF with prominent RV dysfunction:  Nonischemic cardiomyopathy.  St Jude CRT-D device. Nonobstructive CAD on caths in 7/20 and 12/24. Cardiac MRI in 12/24 showed LV EF 20%, RV EF 41%, mid-myocardial basal septal LGE (nonspecific non-coronary pattern).  Echo in 3/25 showed EF 25-30% with severe LV dilation, normal RV size with mild systolic dysfunction, mechanical aortic valve with mean gradient 13 mmHg. RHC in 9/25 showed predominantly RV failure with mildly  decreased CI by Fick (2.2) and preserved CI by thermo (2.38) with PAPi 1.5. Torsemide  restarted. Echo this admission with EF < 20%, moderate RV dysfunction with mild RV enlargement, mechanical aortic valve ok, IVC dilated. He has required midodrine  to keep up his BP.  SBP now up to 120s.  He looks volume overloaded on exam.  - Lasix  80 mg IV bid, start this afternoon.  He seems to have been off diuretic recently.  - Wean down midodrine , can decrease to 5 mg tid.   - Start spironolactone  12.5 mg daily. - Would try to get him back on Farxiga  tomorrow.   - Probably would not be a candidate for advanced therapies with renal dysfunction and deconditioning/frailty; ascending aortic aneurysm is also a concern.  However, CO was not markedly low on last RHC.  2. Atrial fibrillation/flutter: DCCV in 2/25.  He looks like he is probably in AF now. Had recent inappropriate ICD shock in setting of SVT.  - ECG to confirm rhythm, ?repeat DCCV though if in AF not sure this would succeed (had ICD shock prior to admission).  - With inappropriate shock, would be reasonable to proceed with AV nodal ablation or atrial lead.  3. OSA: Moderate OSA, waiting for CPAP.  4. CAD: Nonobstructive on 7/20 and 12/24 caths.  I would aim for LDL at least < 70.  - Continue Crestor  40 mg daily.    5. Mechanical aortic valve: Stable function on echo this admission.  Given history of CVA, should have Lovenox  bridging when he needs to hold warfarin.  Goal INR 2.5-3.5 with additional history of atrial fibrillation.   6. Smoking: We discussed cessation.   7. Ascending aortic aneurysm: Suspect this is related to his bicuspid aortic valve.  Last CT chest w/o contrast in 3/25 showed aneurysm 5.6 cm.  He would be high risk for repair.  This will also complicate consideration of LVAD.  8. Obesity: he tolerate GLP-1 agonist poorly with AKI/dehydration.  9. VT: He has St Jude CRT-D.  - On amiodarone  per EP, LFTs have been elevated (?due to hepatic  congestion with RV failure).  Repeat in am.  - Continue mexiletine 150 mg tid.  10. CKD stage 3: Creatinine 1.86 is near his baseline.   Roberto [name] 05/12/2024 3:54 PM

## 2024-05-12 NOTE — Progress Notes (Signed)
 PHARMACY - ANTICOAGULATION CONSULT NOTE  Pharmacy Consult for warfarin Indication: mechanical aortic valve, afib  Allergies  Allergen Reactions   Latex Hives   Nicotine  Hives and Rash    Patches caused localized rash and hives     Patient Measurements: Height: 5' 6 (167.6 cm) Weight: 99.6 kg (219 lb 9.3 oz) IBW/kg (Calculated) : 63.8 HEPARIN  DW (KG): 85.7  Vital Signs: Temp: 97.9 F (36.6 C) (11/10 1132) Temp Source: Oral (11/10 1132) BP: 112/82 (11/10 1132) Pulse Rate: 80 (11/10 1132)  Labs: Recent Labs    05/09/24 1945 05/09/24 2320 05/11/24 0758 05/12/24 0252  HGB 12.2*  --  12.1* 11.8*  HCT 38.0*  --  38.4* 38.1*  PLT 330  --  318 340  LABPROT 25.9*  --  30.2* 30.5*  INR 2.2*  --  2.7* 2.8*  CREATININE 1.85*  --  1.79* 1.86*  TROPONINIHS 47* 55*  --   --     Estimated Creatinine Clearance: 41.4 mL/min (A) (by C-G formula based on SCr of 1.86 mg/dL (H)).   Medical History: Past Medical History:  Diagnosis Date   Aneurysm    Bicuspid aortic valve    a. s/p #27 Carbomedics mechanical valve on 03/25/2010; b. on Coumadin ; c. TTE 12/17: EF 40-45%, moderately dilated LV with moderate LVH, AVR well-seated with 14 mmHg gradient, peak AV velocity 2.5 m/s, mild mitral valve thickening with mild MR, mildly dilated RV with mildly reduced contraction   Cellulitis    CHF (congestive heart failure) (HCC)    Chronic kidney disease    Chronic systolic CHF (congestive heart failure) (HCC)    a. R/LHC 03/2010 showed no significant CAD, LVEDP 31 mmHg, mean AoV gradient 34 mmHg at rest and 47 mmHg with dobutamine 20 mcg/kg/min, AVA 1.0 cm^2, RA 31, RV 68/25, PA 68/47, PCWP 38. PA sat 65%. CO 6.2 L/min (Fick) and 5.3 L/min (thermodilution)   Clotting disorder    COPD (chronic obstructive pulmonary disease) (HCC)    H/O mechanical aortic valve replacement 03/25/2010   a. #27 Carbomedics mechanical valve   Hearing loss    High cholesterol    HTN (hypertension)     Hypercholesterolemia    Renal infarct 2017   Multiple right renal infarcts, likely embolic.   Stage 3 chronic kidney disease (HCC)    Stroke (HCC)    TIA (transient ischemic attack) 05/2014    Medications:  -Warfarin/lovenox  bridge: Per PCP note on 04/23/24, patient has recently been struggling to eat without vomiting and, therefore, struggling to maintain goal INR levels so the patient was switched from warfarin to enoxaparin  (03/25/24). Plan was to transition back to warfarin once patient could tolerate oral intake   -Patient was cycling through warfarin and dosing as 4mg  PO on one day, 4mg  PO the next, and then 7.5mg  PO and continuing cycle  Assessment: 57 yoM presented s/p fall and txr from [facility]. PMH includes mAVR (warfarin), non-obstructive CAD, thoracic aortic aneurysm, CVA, atrial fibrillation, HFrEF (LVEF 20%), Vtach s/p CRT-D, HLD. Pharmacy consulted to dose warfarin while patient in hospital.  -INR 2.7 -CBC shows Hgb 12s, plts 318 -Last dose of warfarin: 5mg  PO on 11/8 -Last dose of lovenox  bridge: 11/7 @2100   Date Dose  11/7 7.5mg  PO  11/8  5mg  PO  11/9 To give 2mg  PO    Patient INR is therapeutic for the first time today at 2.7 (goal 2.5-3.5), will discontinue lovenox , MD aware. Home amiodarone  200mg  BID was restarted yesterday per EP which was  being held in the setting of elevated LFTs since they are now downtrending. Since amiodarone  has the potential to interact with warfarin and increase INR, will give a dose of 2mg  warfarin today  11/10 INR 2.8.  No overt bleeding or complications noted.  Goal of Therapy:  INR 2.5-3.5 Monitor platelets by anticoagulation protocol: Yes   Plan:  Warfarin 4 mg po x 1 tonight. Continue daily INR.  Harlene Barlow, Berdine [employer], [facility] Clinical Pharmacist  05/12/2024 11:52 AM   [facility] pharmacy phone numbers are listed on [email or web address]

## 2024-05-12 NOTE — Progress Notes (Signed)
 [facility] PROGRESS NOTE    Progress Note  Roberto [name]  FMW:[id number] DOB: [birth year] DOA: 05/09/2024 PCP: [name], DO    Brief Narrative:   Roberto [name] is an 68 y.o. male past medical history significant for aortic valve replacement on Coumadin , nonobstructive CAD, thoracic aortic aneurysm (followed by CT surgery as an outpatient), history of CVA TIA, chronic kidney disease stage III yea, he also has a history of renal infarct and HFrEF/biventricular failure complicated by VT status post CRT-D and severe right-sided heart failure, persistent atrial fibrillation, discharged in August 2025 for wide-complex tachycardia where his device was upgraded to a CRT-D, chronic hypotension on midodrine , recently discharged from [facility] for encephalopathy, acute on chronic HFrEF the day prior to admission.  Comes into the ED for syncopal evaluation followed by tonic-clonic movement with loss of consciousness for few seconds, there was no loss control of bowel or bladder.  Patient denies any prodromal symptoms.  EEG and orthostatics were negative.  Assessment/Plan:    Syncope and collapse likely due to VT's/chronic HFrEF/severe right-sided heart failure/history of ventricular tachycardia with a CRT-D D in place: Pacer was interrogated and it did fire. An echo done recently showed an EF of less than 20% with right RV systolic function reduced mitral and tricuspid regurgitation.   He appears euvolemic on physical exam no JVD or lower extremity edema. EP was consulted recommended start amiodarone , continue mexiletine and continue Coumadin . He is unable to tolerate GDMT due to hypotension. Consult the advanced heart failure team as recommended by EP. On his previous admission the cardiologist related that the patient has a poor prognosis. Palliative care met with family and they would like to continue aggressive management. Further management per cardiology.  Chronic atrial fibrillation  on Coumadin : Holding metoprolol  for concerns of borderline blood pressure. He is currently rate controlled Goal INR 2.5-3.5  Mechanical aortic valve: Goal INR 2.5-3.5. Continue Coumadin  per pharmacy.  CAD/elevated troponins: Slightly elevated of basically remained flat The patient denies any chest pain.  Chronic kidney disease stage IIIa: Baseline creatinine 1.6-1.8. Creatinine has remained stable. Avoid hypotension and nephrotoxic agents  Chronic hypotension: Continue midodrine .  COPD: Continue inhalers.  Obstructive sleep apnea: He was desatting in the 80s on admission so he was placed on 3 L of oxygen. Continue CPAP at night.  Gout: Continue allopurinol .  Mood disorder: Continue current home medications.  GERD: Continue PPI.  Chronic back pain: Baclofen  was restarted.  Frequent falls: PT OT has been consulted   DVT prophylaxis: Coumadin  Family Communication:none Status is: Observation The patient remains OBS appropriate and will d/c before 2 midnights.    Code Status:     Code Status Orders  (From admission, onward)           Start     Ordered   05/10/24 0559  Full code  Continuous       Question:  By:  Answer:  Consent: discussion documented in EHR   05/10/24 0601           Code Status History     Date Active Date Inactive Code Status Order ID Comments User Context   05/05/2024 0918 05/09/2024 1805 Full Code [id number]  Eldonna Elspeth PARAS, MD ED   04/30/2024 2259 05/02/2024 1704 Full Code [id number]  Mansy, Madison LABOR, MD ED   03/21/2024 1338 03/21/2024 1933 Full Code [id number]  Rolan Ezra RAMAN, MD Inpatient   03/07/2024 2043 03/09/2024 2212 Full Code [id number]  Arthea Child, MD ED   08/27/2023  1618 08/30/2023 1510 Full Code [id number]  Lesia Ozell Prentice DEVONNA ED   06/22/2023 1658 06/26/2023 2046 Full Code [id number]  Colletta Manuelita Nat DEVONNA Inpatient   06/17/2023 2313 06/22/2023 1658 Full Code [id number]  Lawence Madison LABOR, MD ED    05/01/2016 1907 05/06/2016 1426 Full Code [id number]  Josette Ade, MD ED      Advance Directive Documentation    Flowsheet Row Most Recent Value  Type of Advance Directive Living will  Pre-existing out of facility DNR order (yellow form or pink MOST form) --  MOST Form in Place? --      IV Access:   Peripheral IV   Procedures and diagnostic studies:   EEG adult Result Date: 05/10/2024 Roberto Arlin [name], MD     05/10/2024  6:07 PM Patient Name: Roberto [name] MRN: [id number] Epilepsy Attending: Arlin [name] Roberto Referring Physician/Provider: Alfornia Madison, MD Date: 05/10/2024 Duration: 22.56 mins Patient history: 68yo M with syncope. EEG to evaluate for seizure Level of alertness: Awake AEDs during EEG study: None Technical aspects: This EEG study was done with scalp electrodes positioned according to the 10-20 International system of electrode placement. Electrical activity was reviewed with band pass filter of 1-70Hz , sensitivity of 7 uV/mm, display speed of 41mm/sec with a 60Hz  notched filter applied as appropriate. EEG data were recorded continuously and digitally stored.  Video monitoring was available and reviewed as appropriate. Description: The posterior dominant rhythm consists of 8-9Hz  activity of moderate voltage (25-35 uV) seen predominantly in posterior head regions, symmetric and reactive to eye opening and eye closing. Drowsiness was characterized by attenuation of the posterior background rhythm. Hyperventilation and photic stimulation were not performed.   IMPRESSION: This study is within normal limits. No seizures or epileptiform discharges were seen throughout the recording. A normal interictal EEG does not exclude the diagnosis of epilepsy. Arlin [name] Roberto     Medical Consultants:   None.   Subjective:    Roberto [name] currently asymptomatic denies any chest pain or shortness of breath  Objective:    Vitals:   05/11/24 1602 05/11/24 1958 05/12/24  0000 05/12/24 0500  BP: 103/81  (!) 128/95 109/81  Pulse: 64 80 80 80  Resp: 18 18 18 18   Temp: (!) 97.5 F (36.4 C) 99.2 F (37.3 C) 98.9 F (37.2 C) 98.3 F (36.8 C)  TempSrc: Oral Oral Oral Oral  SpO2: 95% 96% 90% 95%  Weight:      Height:       SpO2: 95 % O2 Flow Rate (L/min): 1 L/min   Intake/Output Summary (Last 24 hours) at 05/12/2024 0645 Last data filed at 05/12/2024 0200 Gross per 24 hour  Intake 480 ml  Output 100 ml  Net 380 ml   Filed Weights   05/09/24 1824  Weight: 99.6 kg    Exam: General exam: In no acute distress. Respiratory system: Good air movement and clear to auscultation. Cardiovascular system: S1 & S2 heard, RRR. No JVD. Gastrointestinal system: Abdomen is nondistended, soft and nontender.  Extremities: No pedal edema. Skin: No rashes, lesions or ulcers Psychiatry: Judgement and insight appear normal. Mood & affect appropriate. Data Reviewed:    Labs: Basic Metabolic Panel: Recent Labs  Lab 05/07/24 0458 05/07/24 0826 05/08/24 0215 05/09/24 0318 05/09/24 1945 05/11/24 0758 05/12/24 0252  NA  --    < > 140 140 139 138 141  K  --    < > 3.4* 4.7 3.5 3.6 3.4*  CL  --    < >  100 101 102 100 107  CO2  --    < > 26 28 20* 25 21*  GLUCOSE  --    < > 116* 93 125* 102* 111*  BUN  --    < > 23 24* 23 21 21   CREATININE  --    < > 1.61* 1.63* 1.85* 1.79* 1.86*  CALCIUM   --    < > 8.9 8.7* 9.0 9.4 9.1  MG 2.2  --  2.0 2.2 1.7  --   --    < > = values in this interval not displayed.   GFR Estimated Creatinine Clearance: 41.4 mL/min (A) (by C-G formula based on SCr of 1.86 mg/dL (H)). Liver Function Tests: Recent Labs  Lab 05/06/24 0324 05/07/24 0826 05/08/24 0215 05/09/24 0318 05/09/24 1945  AST 531* 208* 107* 64* 47*  ALT 301* 257* 196* 147* 121*  ALKPHOS 60 68 74 72 72  BILITOT 1.0 1.0 0.9 1.5* 0.7  PROT 6.2* 6.8 6.9 7.4 6.3*  ALBUMIN 2.9* 3.0* 3.1* 3.2* 3.1*   No results for input(s): LIPASE, AMYLASE in the last 168  hours. No results for input(s): AMMONIA in the last 168 hours.  Coagulation profile Recent Labs  Lab 05/08/24 0215 05/09/24 0318 05/09/24 1945 05/11/24 0758 05/12/24 0252  INR 1.3* 1.6* 2.2* 2.7* 2.8*   COVID-19 Labs  No results for input(s): DDIMER, FERRITIN, LDH, CRP in the last 72 hours.  Lab Results  Component Value Date   SARSCOV2NAA NEGATIVE 05/05/2024   SARSCOV2NAA NEGATIVE 08/21/2023   SARSCOV2NAA NEGATIVE 03/25/2019   SARSCOV2NAA NEGATIVE 01/24/2019    CBC: Recent Labs  Lab 05/06/24 0324 05/08/24 0215 05/09/24 1945 05/11/24 0758 05/12/24 0252  WBC 5.4 7.3 7.8 6.7 7.9  NEUTROABS  --   --  6.2  --   --   HGB 11.3* 12.1* 12.2* 12.1* 11.8*  HCT 36.1* 38.6* 38.0* 38.4* 38.1*  MCV 90.9 91.3 91.3 91.4 92.0  PLT 217 274 330 318 340   Cardiac Enzymes: No results for input(s): CKTOTAL, CKMB, CKMBINDEX, TROPONINI in the last 168 hours.  BNP (last 3 results) No results for input(s): PROBNP in the last 8760 hours. CBG: Recent Labs  Lab 05/05/24 1251 05/05/24 2347 05/06/24 0735 05/06/24 1238 05/06/24 1639  GLUCAP 78 82 85 132* 106*   D-Dimer: No results for input(s): DDIMER in the last 72 hours. Hgb A1c: No results for input(s): HGBA1C in the last 72 hours. Lipid Profile: No results for input(s): CHOL, HDL, LDLCALC, TRIG, CHOLHDL, LDLDIRECT in the last 72 hours. Thyroid  function studies: No results for input(s): TSH, T4TOTAL, T3FREE, THYROIDAB in the last 72 hours.  Invalid input(s): FREET3 Anemia work up: No results for input(s): VITAMINB12, FOLATE, FERRITIN, TIBC, IRON, RETICCTPCT in the last 72 hours. Sepsis Labs: Recent Labs  Lab 05/08/24 0215 05/09/24 1945 05/11/24 0758 05/12/24 0252  WBC 7.3 7.8 6.7 7.9   Microbiology Recent Results (from the past 240 hours)  Culture, blood (routine x 2)     Status: None   Collection Time: 05/05/24  1:10 AM   Specimen: Right Antecubital; Blood   Result Value Ref Range Status   Specimen Description RIGHT ANTECUBITAL  Final   Special Requests   Final    BOTTLES DRAWN AEROBIC AND ANAEROBIC Blood Culture adequate volume   Culture   Final    NO GROWTH 5 DAYS Performed at [facility], [address], [place], [place] [address]    Report Status 05/10/2024 FINAL  Final  Culture,  blood (routine x 2)     Status: None   Collection Time: 05/05/24  1:14 AM   Specimen: Left Antecubital; Blood  Result Value Ref Range Status   Specimen Description LEFT ANTECUBITAL  Final   Special Requests   Final    BOTTLES DRAWN AEROBIC AND ANAEROBIC Blood Culture adequate volume   Culture   Final    NO GROWTH 5 DAYS Performed at [facility], [address] [place], [place] [address]    Report Status 05/10/2024 FINAL  Final  Resp panel by RT-PCR (RSV, Flu A&B, Covid) Anterior Nasal Swab     Status: None   Collection Time: 05/05/24  1:14 AM   Specimen: Anterior Nasal Swab  Result Value Ref Range Status   SARS Coronavirus 2 by RT PCR NEGATIVE NEGATIVE Final    Comment: (NOTE) SARS-CoV-2 target nucleic acids are NOT DETECTED.  The SARS-CoV-2 RNA is generally detectable in upper respiratory specimens during the acute phase of infection. The lowest concentration of SARS-CoV-2 viral copies this assay can detect is 138 copies/mL. A negative result does not preclude SARS-Cov-2 infection and should not be used as the sole basis for treatment or other patient management decisions. A negative result may occur with  improper specimen collection/handling, submission of specimen other than nasopharyngeal swab, presence of viral mutation(s) within the areas targeted by this assay, and inadequate number of viral copies(<138 copies/mL). A negative result must be combined with clinical observations, patient history, and epidemiological information. The expected result is Negative.  Fact Sheet for Patients:   bloggercourse.com  Fact Sheet for Healthcare Providers:  seriousbroker.it  This test is no t yet approved or cleared by the [place]  FDA and  has been authorized for detection and/or diagnosis of SARS-CoV-2 by FDA under an Emergency Use Authorization (EUA). This EUA will remain  in effect (meaning this test can be used) for the duration of the COVID-19 declaration under Section 564(b)(1) of the Act, 21 U.S.C.section 360bbb-3(b)(1), unless the authorization is terminated  or revoked sooner.       Influenza A by PCR NEGATIVE NEGATIVE Final   Influenza B by PCR NEGATIVE NEGATIVE Final    Comment: (NOTE) The Xpert Xpress SARS-CoV-2/FLU/RSV plus assay is intended as an aid in the diagnosis of influenza from Nasopharyngeal swab specimens and should not be used as a sole basis for treatment. Nasal washings and aspirates are unacceptable for Xpert Xpress SARS-CoV-2/FLU/RSV testing.  Fact Sheet for Patients: bloggercourse.com  Fact Sheet for Healthcare Providers: seriousbroker.it  This test is not yet approved or cleared by the [place]  FDA and has been authorized for detection and/or diagnosis of SARS-CoV-2 by FDA under an Emergency Use Authorization (EUA). This EUA will remain in effect (meaning this test can be used) for the duration of the COVID-19 declaration under Section 564(b)(1) of the Act, 21 U.S.C. section 360bbb-3(b)(1), unless the authorization is terminated or revoked.     Resp Syncytial Virus by PCR NEGATIVE NEGATIVE Final    Comment: (NOTE) Fact Sheet for Patients: bloggercourse.com  Fact Sheet for Healthcare Providers: seriousbroker.it  This test is not yet approved or cleared by the [place]  FDA and has been authorized for detection and/or diagnosis of SARS-CoV-2 by FDA under an Emergency Use  Authorization (EUA). This EUA will remain in effect (meaning this test can be used) for the duration of the COVID-19 declaration under Section 564(b)(1) of the Act, 21 U.S.C. section 360bbb-3(b)(1), unless the authorization is terminated or revoked.  Performed at  [facility], [address] [place], [place] [address]      Medications:    acetaminophen   650 mg Oral TID   allopurinol   100 mg Oral Daily   amiodarone   200 mg Oral BID   baclofen   10 mg Oral BID WC   budesonide-glycopyrrolate-formoterol   2 puff Inhalation BID   buPROPion   300 mg Oral Daily   mexiletine  150 mg Oral TID   midodrine   10 mg Oral TID WC   nortriptyline   10 mg Oral QHS   pantoprazole   40 mg Oral BID   Warfarin - Pharmacist Dosing Inpatient   Does not apply q1600   Continuous Infusions:    LOS: 0 days   Erle Odell Castor  [facility]  05/12/2024, 6:45 AM

## 2024-05-12 NOTE — Progress Notes (Signed)
 Heart Failure Navigator Progress Note  Assessed for Heart & Vascular TOC clinic readiness.  Patient does not meet criteria due to patient has a scheduled Advanced heart failure appointment at [facility] on 05/13/2024 and a CVD- [place] appointment on 05/20/2024. No HF TOC. .   Navigator will sign off at this time.   Stephane Haddock, BSN, Scientist, Clinical (histocompatibility And Immunogenetics) Only

## 2024-05-12 NOTE — TOC Initial Note (Signed)
 Transition of Care [name]) - Initial/Assessment Note    Patient Details  Name: Roberto [name] MRN: [id number] Date of Birth: [birth year]  [facility]) CM/SW Contact:    Arlana JINNY Nicholaus ISRAEL Phone Number: [phone] 05/12/2024, 1:57 PM  Clinical Narrative:  1:55 PM- HF CSW attempted to meet with patient at bedside. Patient was not in the room at the time. HF CSW will attempt to meet with patient at a more appropriate time.   HF CSW/CM will continue to follow and monitor dc readiness.                        Patient Goals and CMS Choice            Expected Discharge Plan and Services                                              Prior Living Arrangements/Services                       Activities of Daily Living   ADL Screening (condition at time of admission) Independently performs ADLs?: Yes (appropriate for developmental age) Is the patient deaf or have difficulty hearing?: Yes Does the patient have difficulty seeing, even when wearing glasses/contacts?: Yes Does the patient have difficulty concentrating, remembering, or making decisions?: Yes  Permission Sought/Granted                  Emotional Assessment              Admission diagnosis:  Syncope and collapse [R55] Syncope [R55] Patient Active Problem List   Diagnosis Date Noted   ICD (implantable cardioverter-defibrillator) discharge 05/11/2024   Syncope and collapse 05/10/2024   OSA (obstructive sleep apnea) 05/10/2024   Altered mental status 05/07/2024   Lactic acidosis 05/07/2024   Acute pulmonary edema (HCC) 05/05/2024   Acute respiratory failure with hypoxia (HCC) 05/05/2024   Elevated troponin 05/05/2024   Transaminitis 05/05/2024   Prolonged QT interval 05/05/2024   Normocytic anemia 05/01/2024   Gout 05/01/2024   Paroxysmal atrial fibrillation (HCC) 05/01/2024   Type 2 diabetes mellitus with chronic kidney disease, without long-term current use of  insulin  (HCC) 05/01/2024   Dyslipidemia 05/01/2024   Essential hypertension 05/01/2024   Hypokalemia 05/01/2024   Encephalopathy 04/30/2024   Esophageal dysmotility 03/28/2024   Indigestion 03/24/2024   Unintentional weight loss 03/07/2024   Presence of automatic (implantable) cardiac defibrillator 03/07/2024   Presence of heart assist device (HCC) 02/15/2024   Polyp of colon 12/13/2023   PTSD (post-traumatic stress disorder) 11/15/2023   DM type 2 with diabetic peripheral neuropathy (HCC) 10/23/2023   Essential tremor 10/23/2023   Pacemaker 09/11/2023   Persistent atrial fibrillation (HCC) 07/12/2023   Acute on chronic HFrEF (heart failure with reduced ejection fraction) (HCC) 06/22/2023   VT (ventricular tachycardia) (HCC) 06/18/2023   Hypotension 06/18/2023   Atrial fibrillation (HCC) 06/17/2023   Depression 06/17/2023   Tremor of left hand 05/29/2022   Aneurysm of ascending aorta without rupture 02/17/2022   Anticoagulated on Coumadin  07/29/2021   Chronic kidney disease, stage 3a (HCC) 12/09/2020   Hyperlipidemia associated with type 2 diabetes mellitus (HCC) 12/09/2020   Coronary artery disease involving native coronary artery of native heart without angina pectoris 09/10/2019   Osteoarthritis of spine with radiculopathy, lumbar region 11/21/2018  History of aortic stenosis 06/05/2018   Chronic gout without tophus 04/03/2018   Valvular heart disease 01/17/2018   Morbid obesity (HCC) 01/17/2018   Chronic systolic heart failure (HCC) 09/27/2016   Renal infarct 05/01/2016   Type 2 diabetes mellitus with cardiac complication (HCC) 04/19/2016   Chronic bilateral low back pain without sciatica 04/19/2016   Thoracic aortic aneurysm without rupture 03/16/2016   Aortic atherosclerosis 03/16/2016   Nicotine  dependence, cigarettes, uncomplicated 03/14/2016   NICM (nonischemic cardiomyopathy) (HCC) 03/02/2016   COPD (chronic obstructive pulmonary disease) (HCC) 09/09/2015   H/O  mechanical aortic valve replacement 02/09/2015   Hypertension associated with diabetes (HCC) 02/09/2015   PCP:  [name], DO Pharmacy:   [facility] [address] [place] - [address] [place]) [place] [address] Phone: [phone] Fax: [phone]  [name] [address] [place] [address] Phone: [phone] Fax: [phone]     Social Drivers of Health (SDOH) Social History: SDOH Screenings   Food Insecurity: Food Insecurity Present (05/10/2024)  Housing: Low Risk  (05/10/2024)  Transportation Needs: No Transportation Needs (05/10/2024)  Recent Concern: Transportation Needs - Unmet Transportation Needs (05/01/2024)  Utilities: Not At Risk (05/10/2024)  Alcohol Screen: Low Risk  (09/04/2023)  Depression (PHQ2-9): High Risk (03/28/2024)  Financial Resource Strain: High Risk (04/21/2024)  Physical Activity: Inactive (04/21/2024)  Social Connections: Socially Isolated (05/10/2024)  Stress: Stress Concern Present (04/21/2024)  Tobacco Use: High Risk (05/09/2024)  Health Literacy: Adequate Health Literacy (09/04/2023)   SDOH Interventions:     Readmission Risk Interventions    08/29/2023    3:19 PM 06/18/2023   10:33 AM  Readmission Risk Prevention Plan  Transportation Screening Complete Complete  PCP or Specialist Appt within 3-5 Days  Complete  HRI or Home Care Consult Complete Complete  Social Work Consult for Recovery Care Planning/Counseling Complete Patient refused  Palliative Care Screening Not Applicable Not Applicable  Medication Review Oceanographer) Referral to Pharmacy Complete

## 2024-05-12 NOTE — Progress Notes (Addendum)
  Patient Name: Roberto [name] Date of Encounter: 05/12/2024  Primary Cardiologist: [name], MD Electrophysiologist: Danelle Birmingham, MD  Interval Summary   The patient is doing well today.  At this time, the patient denies chest pain, shortness of breath, or any new concerns.  Vital Signs    Vitals:   05/11/24 1958 05/12/24 0000 05/12/24 0500 05/12/24 0723  BP:  (!) 128/95 109/81 95/68  Pulse: 80 80 80 81  Resp: 18 18 18 18   Temp: 99.2 F (37.3 C) 98.9 F (37.2 C) 98.3 F (36.8 C) 97.7 F (36.5 C)  TempSrc: Oral Oral Oral Oral  SpO2: 96% 90% 95%   Weight:      Height:        Intake/Output Summary (Last 24 hours) at 05/12/2024 0913 Last data filed at 05/12/2024 0200 Gross per 24 hour  Intake 480 ml  Output 100 ml  Net 380 ml   Filed Weights   05/09/24 1824  Weight: 99.6 kg    Physical Exam    GEN- NAD, Alert and oriented  Lungs- Clear to ausculation bilaterally, normal work of breathing Cardiac- Regular rate and rhythm, no murmurs, rubs or gallops, device site wnl GI- soft, NT, ND, + BS Extremities- no clubbing or cyanosis. No edema  Telemetry    VP 80's, episode of pacing where QRS morphology changes and is wide in comparison to baseline rhythm noted (personally reviewed). His underlying rhythm is sinus.  Hospital Course    Roberto [name] is a 68 y.o. male with PMH of permanent AF, end-stage HF s/p CRT-D admitted for syncopal event.  Device interrogation showed multiple ATP and 1 shock. Initially had what device classified as SVT but at some point classification changed and he received ATP therapy for this and after multiple ATPs he accelerated into ventricular tachycardia and ultimately received a 36 J shock. He reports he was not aware of the shock. When comparing the electrograms available to other SVT episodes, it appears that he received inappropriate ATP for SVT. Unfortunately, patient does not have an atrial lead which would allow for  discrimination.    Assessment & Plan    ICD Shock  VT  -continue amiodarone  200 mg BID  -continue mexiletine 150 mg TID  -monitor electrolytes closely, goal K+ =/>4, Mg+ =/> 2 -follow LFT's   Permanent AF/AFL  -no atrial lead in place  -coumadin  for anticoagulation in setting of AVR -could consider addition of atrial lead vs AVJ ablation, but given his end-stage HF, may be reasonable to continue medications alone.  He is not sure he would want to have further procedures upon discussion, especially if it were to lead to complications.  Consider Palliative Care evaluation to help with determining goals of care  -plan to follow up with Dr. Birmingham as outpatient  - at this point he has reverted back to NSR. I'd suggest allowing him to be discharged if stable and consider adding an atrial lead in the future depending on how he does.   Chronic Systolic Heart Failure  -not significantly decompensated  -follows with AHF Team as an outpatient   Mechanical AVR  -coumadin  for anticoagulation     For questions or updates, please contact [facility] Please consult [email or web address] for contact info under     Signed, Daphne Barrack, NP-C, AGACNP-BC [facility] - Electrophysiology  05/12/2024, 9:13 AM

## 2024-05-13 ENCOUNTER — Encounter (HOSPITAL_COMMUNITY)
Admission: EM | Disposition: A | Discharge status: Home / Self Care | Payer: Self-pay | Source: Home / Self Care | Attending: Internal Medicine

## 2024-05-13 ENCOUNTER — Observation Stay: Admitting: Family

## 2024-05-13 DIAGNOSIS — F05 Delirium due to known physiological condition: Secondary | ICD-10-CM | POA: Diagnosis not present

## 2024-05-13 DIAGNOSIS — R55 Syncope and collapse: Secondary | ICD-10-CM | POA: Diagnosis present

## 2024-05-13 DIAGNOSIS — I5023 Acute on chronic systolic (congestive) heart failure: Secondary | ICD-10-CM | POA: Diagnosis present

## 2024-05-13 DIAGNOSIS — Z952 Presence of prosthetic heart valve: Secondary | ICD-10-CM | POA: Diagnosis not present

## 2024-05-13 DIAGNOSIS — I130 Hypertensive heart and chronic kidney disease with heart failure and stage 1 through stage 4 chronic kidney disease, or unspecified chronic kidney disease: Secondary | ICD-10-CM | POA: Diagnosis present

## 2024-05-13 DIAGNOSIS — I442 Atrioventricular block, complete: Secondary | ICD-10-CM

## 2024-05-13 DIAGNOSIS — Z7901 Long term (current) use of anticoagulants: Secondary | ICD-10-CM | POA: Diagnosis not present

## 2024-05-13 DIAGNOSIS — K761 Chronic passive congestion of liver: Secondary | ICD-10-CM | POA: Diagnosis present

## 2024-05-13 DIAGNOSIS — I4819 Other persistent atrial fibrillation: Secondary | ICD-10-CM | POA: Diagnosis not present

## 2024-05-13 DIAGNOSIS — E7800 Pure hypercholesterolemia, unspecified: Secondary | ICD-10-CM | POA: Diagnosis present

## 2024-05-13 DIAGNOSIS — I4821 Permanent atrial fibrillation: Secondary | ICD-10-CM | POA: Diagnosis present

## 2024-05-13 DIAGNOSIS — I428 Other cardiomyopathies: Secondary | ICD-10-CM | POA: Diagnosis present

## 2024-05-13 DIAGNOSIS — J449 Chronic obstructive pulmonary disease, unspecified: Secondary | ICD-10-CM | POA: Diagnosis present

## 2024-05-13 DIAGNOSIS — I5082 Biventricular heart failure: Secondary | ICD-10-CM | POA: Diagnosis present

## 2024-05-13 DIAGNOSIS — I472 Ventricular tachycardia, unspecified: Secondary | ICD-10-CM | POA: Diagnosis present

## 2024-05-13 DIAGNOSIS — Z515 Encounter for palliative care: Secondary | ICD-10-CM | POA: Diagnosis not present

## 2024-05-13 DIAGNOSIS — I5084 End stage heart failure: Secondary | ICD-10-CM | POA: Diagnosis present

## 2024-05-13 DIAGNOSIS — I471 Supraventricular tachycardia, unspecified: Secondary | ICD-10-CM | POA: Diagnosis present

## 2024-05-13 DIAGNOSIS — F39 Unspecified mood [affective] disorder: Secondary | ICD-10-CM | POA: Diagnosis present

## 2024-05-13 DIAGNOSIS — Z4502 Encounter for adjustment and management of automatic implantable cardiac defibrillator: Secondary | ICD-10-CM | POA: Diagnosis not present

## 2024-05-13 DIAGNOSIS — G909 Disorder of the autonomic nervous system, unspecified: Secondary | ICD-10-CM | POA: Diagnosis present

## 2024-05-13 DIAGNOSIS — I7121 Aneurysm of the ascending aorta, without rupture: Secondary | ICD-10-CM | POA: Diagnosis present

## 2024-05-13 DIAGNOSIS — I2489 Other forms of acute ischemic heart disease: Secondary | ICD-10-CM | POA: Diagnosis present

## 2024-05-13 DIAGNOSIS — Z6835 Body mass index (BMI) 35.0-35.9, adult: Secondary | ICD-10-CM | POA: Diagnosis not present

## 2024-05-13 DIAGNOSIS — N1832 Chronic kidney disease, stage 3b: Secondary | ICD-10-CM | POA: Diagnosis present

## 2024-05-13 HISTORY — PX: LEAD INSERTION: EP1212

## 2024-05-13 HISTORY — PX: UPPER EXTREMITY VENOGRAPHY: CATH118272

## 2024-05-13 LAB — COMPREHENSIVE METABOLIC PANEL WITH GFR
ALT: 66 U/L — ABNORMAL HIGH (ref 0–44)
AST: 37 U/L (ref 15–41)
Albumin: 2.8 g/dL — ABNORMAL LOW (ref 3.5–5.0)
Alkaline Phosphatase: 76 U/L (ref 38–126)
Anion gap: 12 (ref 5–15)
BUN: 17 mg/dL (ref 8–23)
CO2: 25 mmol/L (ref 22–32)
Calcium: 8.8 mg/dL — ABNORMAL LOW (ref 8.9–10.3)
Chloride: 108 mmol/L (ref 98–111)
Creatinine, Ser: 1.67 mg/dL — ABNORMAL HIGH (ref 0.61–1.24)
GFR, Estimated: 44 mL/min — ABNORMAL LOW (ref >60)
Glucose, Bld: 99 mg/dL (ref 70–99)
Potassium: 3.3 mmol/L — ABNORMAL LOW (ref 3.5–5.1)
Sodium: 145 mmol/L (ref 135–145)
Total Bilirubin: 0.5 mg/dL (ref 0.0–1.2)
Total Protein: 6.1 g/dL — ABNORMAL LOW (ref 6.5–8.1)

## 2024-05-13 LAB — CBC
HCT: 38.4 % — ABNORMAL LOW (ref 39.0–52.0)
Hemoglobin: 12 g/dL — ABNORMAL LOW (ref 13.0–17.0)
MCH: 28.7 pg (ref 26.0–34.0)
MCHC: 31.3 g/dL (ref 30.0–36.0)
MCV: 91.9 fL (ref 80.0–100.0)
Platelets: 324 K/uL (ref 150–400)
RBC: 4.18 MIL/uL — ABNORMAL LOW (ref 4.22–5.81)
RDW: 18.2 % — ABNORMAL HIGH (ref 11.5–15.5)
WBC: 9.6 K/uL (ref 4.0–10.5)
nRBC: 0 % (ref 0.0–0.2)

## 2024-05-13 LAB — AMMONIA: Ammonia: 24 umol/L (ref 9–35)

## 2024-05-13 LAB — PROTIME-INR
INR: 2.7 — ABNORMAL HIGH (ref 0.8–1.2)
Prothrombin Time: 30.3 s — ABNORMAL HIGH (ref 11.4–15.2)

## 2024-05-13 LAB — MAGNESIUM: Magnesium: 2 mg/dL (ref 1.7–2.4)

## 2024-05-13 LAB — LACTIC ACID, PLASMA
Lactic Acid, Venous: 1.7 mmol/L (ref 0.5–1.9)
Lactic Acid, Venous: 1.6 mmol/L (ref 0.5–1.9)

## 2024-05-13 SURGERY — LEAD INSERTION

## 2024-05-13 MED ORDER — HEPARIN (PORCINE) IN NACL 1000-0.9 UT/500ML-% IV SOLN
INTRAVENOUS | Status: DC | PRN
  Administered 2024-05-13: 500 mL

## 2024-05-13 MED ORDER — CEFAZOLIN SODIUM-DEXTROSE 2-4 GM/100ML-% IV SOLN: 2.0000 g | INTRAVENOUS | Status: AC

## 2024-05-13 MED ORDER — CHLORHEXIDINE GLUCONATE 4 % EX SOLN: 60.0000 mL | Freq: Once | CUTANEOUS | Status: DC

## 2024-05-13 MED ORDER — CEFAZOLIN SODIUM-DEXTROSE 1-4 GM/50ML-% IV SOLN
1.0000 g | Freq: Four times a day (QID) | INTRAVENOUS | Status: AC
  Administered 2024-05-13 – 2024-05-14 (×3): 1 g via INTRAVENOUS
  Filled 2024-05-13 (×3): qty 50

## 2024-05-13 MED ORDER — LIDOCAINE HCL (PF) 1 % IJ SOLN
INTRAMUSCULAR | Status: DC | PRN
  Administered 2024-05-13: 30 mL

## 2024-05-13 MED ORDER — OLANZAPINE 5 MG PO TBDP
2.5000 mg | ORAL_TABLET | Freq: Four times a day (QID) | ORAL | Status: DC | PRN
  Administered 2024-05-13 (×2): 2.5 mg via ORAL
  Filled 2024-05-13 (×3): qty 0.5

## 2024-05-13 MED ORDER — SODIUM CHLORIDE 0.9 % IV SOLN
INTRAVENOUS | Status: AC
  Administered 2024-05-13: 80 mg
  Filled 2024-05-13: qty 2

## 2024-05-13 MED ORDER — SPIRONOLACTONE 25 MG PO TABS
25.0000 mg | ORAL_TABLET | Freq: Every day | ORAL | Status: DC
  Administered 2024-05-13 – 2024-05-14 (×2): 25 mg via ORAL
  Filled 2024-05-13 (×2): qty 1

## 2024-05-13 MED ORDER — POTASSIUM CHLORIDE CRYS ER 20 MEQ PO TBCR
40.0000 meq | EXTENDED_RELEASE_TABLET | Freq: Two times a day (BID) | ORAL | Status: DC
Start: 2024-05-13 — End: 2024-05-13

## 2024-05-13 MED ORDER — IODIXANOL 320 MG/ML IV SOLN
INTRAVENOUS | Status: DC | PRN
  Administered 2024-05-13: 15 mL via INTRAVENOUS

## 2024-05-13 MED ORDER — WARFARIN SODIUM 2 MG PO TABS: 4.0000 mg | ORAL_TABLET | Freq: Once | ORAL | Status: DC

## 2024-05-13 MED ORDER — POTASSIUM CHLORIDE CRYS ER 20 MEQ PO TBCR
40.0000 meq | EXTENDED_RELEASE_TABLET | ORAL | Status: AC
  Administered 2024-05-13 (×2): 40 meq via ORAL
  Filled 2024-05-13 (×2): qty 2

## 2024-05-13 MED ORDER — MELATONIN 5 MG PO TABS
5.0000 mg | ORAL_TABLET | Freq: Every day | ORAL | Status: DC
  Administered 2024-05-13: 5 mg via ORAL
  Filled 2024-05-13: qty 1

## 2024-05-13 MED ORDER — CEFAZOLIN SODIUM-DEXTROSE 2-4 GM/100ML-% IV SOLN
INTRAVENOUS | Status: AC
  Administered 2024-05-13: 2 g via INTRAVENOUS
  Filled 2024-05-13: qty 100

## 2024-05-13 MED ORDER — ACETAMINOPHEN 325 MG PO TABS: 325.0000 mg | ORAL_TABLET | ORAL | Status: DC | PRN

## 2024-05-13 MED ORDER — SODIUM CHLORIDE 0.9 % IV SOLN
80.0000 mg | INTRAVENOUS | Status: AC
  Filled 2024-05-13: qty 2

## 2024-05-13 MED ORDER — LIDOCAINE HCL (PF) 1 % IJ SOLN
INTRAMUSCULAR | Status: AC
  Filled 2024-05-13: qty 60

## 2024-05-13 MED ORDER — SODIUM CHLORIDE 0.9 % IV SOLN: INTRAVENOUS | Status: DC

## 2024-05-13 MED ORDER — MIDODRINE HCL 5 MG PO TABS
2.5000 mg | ORAL_TABLET | Freq: Three times a day (TID) | ORAL | Status: DC
  Administered 2024-05-13 (×2): 2.5 mg via ORAL
  Filled 2024-05-13 (×2): qty 1

## 2024-05-13 MED ORDER — DAPAGLIFLOZIN PROPANEDIOL 10 MG PO TABS
10.0000 mg | ORAL_TABLET | Freq: Every day | ORAL | Status: DC
  Administered 2024-05-13 – 2024-05-14 (×2): 10 mg via ORAL
  Filled 2024-05-13 (×2): qty 1

## 2024-05-13 SURGICAL SUPPLY — 9 items
CABLE SURGICAL S-101-97-12 (CABLE) ×1 IMPLANT
ELECT DEFIB PAD ADLT CADENCE (PAD) IMPLANT
GUIDEWIRE ANGLED .035X150CM (WIRE) IMPLANT
LEAD ULTIPACE 52 LPA1231/52 (Lead) IMPLANT
MAT PREVALON FULL STRYKER (MISCELLANEOUS) IMPLANT
POUCH AIGIS-R ANTIBACT ICD LRG (Mesh General) IMPLANT
SHEATH 7FR PRELUDE SNAP 13 (SHEATH) IMPLANT
SHEATH PROBE COVER 6X72 (BAG) IMPLANT
TRAY PACEMAKER INSERTION (PACKS) ×1 IMPLANT

## 2024-05-13 NOTE — Progress Notes (Signed)
 Physical Therapy Treatment Patient Details Name: Roberto [name] MRN: [id number] DOB: 1955-10-14 Today's Date: 05/13/2024   History of Present Illness 68 y.o. male adm 05/09/24 due to syncope from ICD shock (discharged from [facility] same day), acute on chronic CHF. PMHx: T2DM, CKD, liver disease, HTN, HLD, CVA, HFrEF, ICD, hypotension on midodrine , COPD, obesity, essential tremor    PT Comments  Pt pleasant and eager to engage in mobility. Pt with increasing gait distance without knee buckling or LOB with use of RW. Pt educated for standing and seated HEP. Pt encouraged to mobilize with staff with OPPT appropriate.  HR 88-90 with periods of Vtach, pt asymptomatic    If plan is discharge home, recommend the following: A little help with walking and/or transfers;A little help with bathing/dressing/bathroom;Assistance with cooking/housework;Direct supervision/assist for medications management;Direct supervision/assist for financial management;Assist for transportation;Help with stairs or ramp for entrance   Can travel by private vehicle        Equipment Recommendations  Rolling walker (2 wheels)    Recommendations for Other Services       Precautions / Restrictions Precautions Precautions: Fall Recall of Precautions/Restrictions: Intact     Mobility  Bed Mobility Overal bed mobility: Modified Independent             General bed mobility comments: HOB 15 degrees with pt able to pivot to EOB without assist    Transfers Overall transfer level: Needs assistance   Transfers: Sit to/from Stand Sit to Stand: Supervision           General transfer comment: cues for hand placement with pt reliant on UB support to rise from bed and recliner. Pt performed 10 repeated sit to stands from recliner with UB use    Ambulation/Gait Ambulation/Gait assistance: Contact guard assist Gait Distance (Feet): 250 Feet Assistive device: Rolling walker (2 wheels) Gait Pattern/deviations:  Step-through pattern, Decreased stride length   Gait velocity interpretation: 1.31 - 2.62 ft/sec, indicative of limited community ambulator   General Gait Details: cues for proximity to RW , pt able to [employer] Mobility     Tilt Bed    Modified Rankin (Stroke Patients Only)       Balance Overall balance assessment: Needs assistance Sitting-balance support: No upper extremity supported, Feet supported Sitting balance-Leahy Scale: Good     Standing balance support: Bilateral upper extremity supported, During functional activity, Reliant on assistive device for balance Standing balance-Leahy Scale: Poor Standing balance comment: RW for gait                            Communication Communication Communication: Impaired Factors Affecting Communication: Hearing impaired  Cognition Arousal: Alert Behavior During Therapy: WFL for tasks assessed/performed   PT - Cognitive impairments: Memory                         Following commands: Intact      Cueing Cueing Techniques: Verbal cues  Exercises General Exercises - Lower Extremity Long Arc Quad: AROM, Both, 20 reps, Seated, Strengthening Hip Flexion/Marching: AROM, Both, 20 reps, Seated, Strengthening    General Comments        Pertinent Vitals/Pain Pain Assessment Pain Assessment: No/denies pain    Home Living  Prior Function            PT Goals (current goals can now be found in the care plan section) Progress towards PT goals: Progressing toward goals    Frequency    Min 1X/week      PT Plan      Co-evaluation              AM-PAC PT 6 Clicks Mobility   Outcome Measure  Help needed turning from your back to your side while in a flat bed without using bedrails?: None Help needed moving from lying on your back to sitting on the side of a flat bed without using bedrails?: None Help needed moving  to and from a bed to a chair (including a wheelchair)?: A Little Help needed standing up from a chair using your arms (e.g., wheelchair or bedside chair)?: A Little Help needed to walk in hospital room?: A Little Help needed climbing 3-5 steps with a railing? : A Little 6 Click Score: 20    End of Session Equipment Utilized During Treatment: Gait belt Activity Tolerance: Patient tolerated treatment well Patient left: in chair;with call bell/phone within reach;with chair alarm set Nurse Communication: Mobility status PT Visit Diagnosis: Other abnormalities of gait and mobility (R26.89);Muscle weakness (generalized) (M62.81);Difficulty in walking, not elsewhere classified (R26.2)     Time: 9071-9051 PT Time Calculation (min) (ACUTE ONLY): 20 min  Charges:    $Gait Training: 8-22 mins PT General Charges $$ ACUTE PT VISIT: 1 Visit                     Lenoard SQUIBB, PT Acute Rehabilitation Services Office: [phone]    Lenoard NOVAK Landry [place]bill 05/13/2024, 10:41 AM

## 2024-05-13 NOTE — Progress Notes (Addendum)
 Patient Name: Roberto [name] Date of Encounter: 05/13/2024  Primary Cardiologist: [name], MD Electrophysiologist: Danelle Birmingham, MD  Interval Summary   The patient is doing well today. He was reportedly delirious overnight.  At this time, the patient denies chest pain, shortness of breath, or any new concerns.  Vital Signs    Vitals:   05/13/24 0001 05/13/24 0515 05/13/24 0719 05/13/24 0800  BP: (!) 111/99 100/88 113/75   Pulse: 90 80 (!) 110 80  Resp: 18 19 18    Temp: 98.1 F (36.7 C) 97.7 F (36.5 C) 98.3 F (36.8 C)   TempSrc: Oral Oral Oral   SpO2: 93% 93% 90%   Weight:  100.7 kg    Height:  5' 6 (1.676 m)      Intake/Output Summary (Last 24 hours) at 05/13/2024 1011 Last data filed at 05/13/2024 0600 Gross per 24 hour  Intake 360 ml  Output 1100 ml  Net -740 ml   Filed Weights   05/09/24 1824 05/13/24 0515  Weight: 99.6 kg 100.7 kg    Physical Exam    GEN- NAD, Alert and oriented  Lungs- Clear to ausculation bilaterally, normal work of breathing Cardiac- Regular rate and rhythm, no murmurs, rubs or gallops GI- soft, NT, ND, + BS Extremities- no clubbing or cyanosis. No edema  Telemetry    VP 80's with frequent PVC's.  AV dyssynchrony noted.  (personally reviewed)  Hospital Course    Roberto [name] is a 68 y.o. male  with PMH of permanent AF, end-stage HF s/p CRT-D admitted for syncopal event.  Device interrogation showed multiple ATP and 1 shock. Initially had what device classified as SVT but at some point classification changed and he received ATP therapy for this and after multiple ATPs he accelerated into ventricular tachycardia and ultimately received a 36 J shock. He reports he was not aware of the shock. When comparing the electrograms available to other SVT episodes, it appears that he received inappropriate ATP for SVT. Unfortunately, patient does not have an atrial lead which would allow for discrimination.  EKG shows AV dyssynchrony.     Assessment & Plan    ICD Shock  VT -amiodarone  200 mg BID  -mexiletine 150 mg TID  -goal K+>4, Mg+>2 -follow LFT's > mild elevation / suspect some element of congestive hepatopathy   Persistent AF / AFL  AV Dyssynchrony on EKG  Appears to be in SR by EKG with AV dyssynchrony  -NPO for venogram and atrial lead placement 05/13/24 -coumadin  in setting of AVR > plan for no coumadin  this evening if able to place lead   Chronic Systolic HF  -anticipate AV synchrony will make HF mgmt easier  -atrial lead placement as above  -follows with AHF Team   Mechanical AVR  -coumadin  per pharmacy      For questions or updates, please contact [facility] Please consult [email or web address] for contact info under     Signed, Daphne Barrack, NP-C, AGACNP-BC [facility] - Electrophysiology  05/13/2024, 10:20 AM  EP attending  Patient seen and examined. Discussed with Dr. Rolan. He was delusional last night but oriented this morning and back to baseline. On exam he is a RRR and lungs are clear and ext are warm. Tele with biv pacing and underlying NSR. INR 2.7. A/P CHB - he is dysynchronous between the atrium and ventricle as he is back in NSR. He does not have an atrial lead but need one to restore AV synchrony. He is not  an ideal surgical candidate for a new pacing lead but probably at his best right now. We will have him undergo a venogram and attempt atrial lead insertion to establish AV synchrony.  Acute on chronic systolic heart failure - he will continue GDMT under directio of Dr. Rolan and will hopefully benefit by the restoration of AV synchrony.  VT - he has not had any in several days. Continue oral amiodarone .  Danelle Donna Silverman,MD

## 2024-05-13 NOTE — H&P (View-Only) (Signed)
 Patient Name: Roberto [name] Date of Encounter: 05/13/2024  Primary Cardiologist: [name], MD Electrophysiologist: Danelle Birmingham, MD  Interval Summary   The patient is doing well today. He was reportedly delirious overnight.  At this time, the patient denies chest pain, shortness of breath, or any new concerns.  Vital Signs    Vitals:   05/13/24 0001 05/13/24 0515 05/13/24 0719 05/13/24 0800  BP: (!) 111/99 100/88 113/75   Pulse: 90 80 (!) 110 80  Resp: 18 19 18    Temp: 98.1 F (36.7 C) 97.7 F (36.5 C) 98.3 F (36.8 C)   TempSrc: Oral Oral Oral   SpO2: 93% 93% 90%   Weight:  100.7 kg    Height:  5' 6 (1.676 m)      Intake/Output Summary (Last 24 hours) at 05/13/2024 1011 Last data filed at 05/13/2024 0600 Gross per 24 hour  Intake 360 ml  Output 1100 ml  Net -740 ml   Filed Weights   05/09/24 1824 05/13/24 0515  Weight: 99.6 kg 100.7 kg    Physical Exam    GEN- NAD, Alert and oriented  Lungs- Clear to ausculation bilaterally, normal work of breathing Cardiac- Regular rate and rhythm, no murmurs, rubs or gallops GI- soft, NT, ND, + BS Extremities- no clubbing or cyanosis. No edema  Telemetry    VP 80's with frequent PVC's.  AV dyssynchrony noted.  (personally reviewed)  Hospital Course    Roberto [name] is a 68 y.o. male  with PMH of permanent AF, end-stage HF s/p CRT-D admitted for syncopal event.  Device interrogation showed multiple ATP and 1 shock. Initially had what device classified as SVT but at some point classification changed and he received ATP therapy for this and after multiple ATPs he accelerated into ventricular tachycardia and ultimately received a 36 J shock. He reports he was not aware of the shock. When comparing the electrograms available to other SVT episodes, it appears that he received inappropriate ATP for SVT. Unfortunately, patient does not have an atrial lead which would allow for discrimination.  EKG shows AV dyssynchrony.     Assessment & Plan    ICD Shock  VT -amiodarone  200 mg BID  -mexiletine 150 mg TID  -goal K+>4, Mg+>2 -follow LFT's > mild elevation / suspect some element of congestive hepatopathy   Persistent AF / AFL  AV Dyssynchrony on EKG  Appears to be in SR by EKG with AV dyssynchrony  -NPO for venogram and atrial lead placement 05/13/24 -coumadin  in setting of AVR > plan for no coumadin  this evening if able to place lead   Chronic Systolic HF  -anticipate AV synchrony will make HF mgmt easier  -atrial lead placement as above  -follows with AHF Team   Mechanical AVR  -coumadin  per pharmacy      For questions or updates, please contact [facility] Please consult [email or web address] for contact info under     Signed, Daphne Barrack, NP-C, AGACNP-BC [facility] - Electrophysiology  05/13/2024, 10:20 AM  EP attending  Patient seen and examined. Discussed with Dr. Rolan. He was delusional last night but oriented this morning and back to baseline. On exam he is a RRR and lungs are clear and ext are warm. Tele with biv pacing and underlying NSR. INR 2.7. A/P CHB - he is dysynchronous between the atrium and ventricle as he is back in NSR. He does not have an atrial lead but need one to restore AV synchrony. He is not  an ideal surgical candidate for a new pacing lead but probably at his best right now. We will have him undergo a venogram and attempt atrial lead insertion to establish AV synchrony.  Acute on chronic systolic heart failure - he will continue GDMT under directio of Dr. Rolan and will hopefully benefit by the restoration of AV synchrony.  VT - he has not had any in several days. Continue oral amiodarone .  Danelle Donna Silverman,MD

## 2024-05-13 NOTE — Interval H&P Note (Signed)
 History and Physical Interval Note:  05/13/2024 10:48 AM  Roberto [name]  has presented today for surgery, with the diagnosis of heart block.  The various methods of treatment have been discussed with the patient and family. After consideration of risks, benefits and other options for treatment, the patient has consented to  Procedure(s): LEAD INSERTION (N/A) UPPER EXTREMITY VENOGRAPHY (N/A) as a surgical intervention.  The patient's history has been reviewed, patient examined, no change in status, stable for surgery.  I have reviewed the patient's chart and labs.  Questions were answered to the patient's satisfaction.     Danelle Birmingham

## 2024-05-13 NOTE — Progress Notes (Signed)
 Mobility Specialist Progress Note:    05/13/24 1059  Mobility  Activity Ambulated with assistance  Level of Assistance Contact guard assist, steadying assist  Assistive Device Front wheel walker  Distance Ambulated (ft) 15 ft  Range of Motion/Exercises Active  Activity Response Tolerated well  Mobility Referral Yes  Mobility visit 1 Mobility  Mobility Specialist Start Time (ACUTE ONLY) 1059  Mobility Specialist Stop Time (ACUTE ONLY) 1105  Mobility Specialist Time Calculation (min) (ACUTE ONLY) 6 min   Received pt in room agreeable to quick session before procedure. No c/o any symptoms. Pt moving and ambulating decently. Returned pt to bed w/ all needs met.   Venetia Keel Mobility Specialist Please Neurosurgeon or Rehab Office at [phone]

## 2024-05-13 NOTE — Progress Notes (Signed)
 [facility] PROGRESS NOTE    Progress Note  Roberto [name]  FMW:[id number] DOB: Apr 29, 1956 DOA: 05/09/2024 PCP: [name], DO    Brief Narrative:   Roberto [name] is an 68 y.o. male past medical history significant for aortic valve replacement on Coumadin , nonobstructive CAD, thoracic aortic aneurysm (followed by CT surgery as an outpatient), history of CVA TIA, chronic kidney disease stage III yea, he also has a history of renal infarct and HFrEF/biventricular failure complicated by VT status post CRT-D and severe right-sided heart failure, persistent atrial fibrillation, discharged in August 2025 for wide-complex tachycardia where his device was upgraded to a CRT-D, chronic hypotension on midodrine , recently discharged from [facility] for encephalopathy, acute on chronic HFrEF the day prior to admission.  Comes into the ED for syncopal evaluation followed by tonic-clonic movement with loss of consciousness for few seconds, there was no loss control of bowel or bladder.  Patient denies any prodromal symptoms.  EEG and orthostatics were negative.  Assessment/Plan:  Syncope and collapse likely due to VT's/chronic HFrEF/severe right-sided heart failure/history of ventricular tachycardia with a CRT-D D in place: Pacer was interrogated and it did fire. An echo done recently showed an EF of less than 20% with right RV systolic function reduced mitral and tricuspid regurgitation.   EP was consulted, continue amiodarone ,  mexiletine and  Coumadin . He is unable to tolerate GDMT due to hypotension. The advanced heart failure team was consulted, continue Lasix , low-dose Aldactone , and titrate down midodrine . Hopefully we can start Farxiga  today, they deemed an adequate candidate for advanced therapy. On his previous admission the cardiologist related that the patient has a poor prognosis. Palliative care met with family and they would like to continue aggressive management. Further management  per cardiology. Just mild elevation in ALT which are improved compared to 05/09/2024  Chronic atrial fibrillation on Coumadin : Holding metoprolol  for concerns of borderline blood pressure. He is currently rate controlled Goal INR 2.5-3.5  Mechanical aortic valve: Goal INR 2.5-3.5. Continue Coumadin  per pharmacy.  CAD/elevated troponins: Slightly elevated of basically remained flat The patient denies any chest pain.  Ascending aortic aneurysm chronic Suspect related to bicuspid valve.  Chronic kidney disease stage IIIa: Baseline creatinine 1.6-1.8. Creatinine has remained stable. Avoid hypotension and nephrotoxic agents  Chronic hypotension: Continue midodrine .  COPD: Continue inhalers.  Obstructive sleep apnea: He was desatting in the 80s on admission so he was placed on 3 L of oxygen. Continue CPAP at night.  Gout: Continue allopurinol .  Mood disorder: Continue current home medications.  GERD: Continue PPI.  Chronic back pain: Baclofen  was restarted.  Frequent falls: PT OT has been consulted   DVT prophylaxis: Coumadin  Family Communication:none Status is: Observation The patient remains OBS appropriate and will d/c before 2 midnights.    Code Status:     Code Status Orders  (From admission, onward)           Start     Ordered   05/10/24 0559  Full code  Continuous       Question:  By:  Answer:  Consent: discussion documented in EHR   05/10/24 0601           Code Status History     Date Active Date Inactive Code Status Order ID Comments User Context   05/05/2024 0918 05/09/2024 1805 Full Code [id number]  Eldonna Elspeth PARAS, MD ED   04/30/2024 2259 05/02/2024 1704 Full Code [id number]  Mansy, Madison LABOR, MD ED   03/21/2024 1338 03/21/2024 1933 Full  Code [id number]  Rolan Ezra RAMAN, MD Inpatient   03/07/2024 2043 03/09/2024 2212 Full Code [id number]  Arthea Child, MD ED   08/27/2023 1618 08/30/2023 1510 Full Code [id number]  Lesia Ozell Prentice DEVONNA ED   06/22/2023 1658 06/26/2023 2046 Full Code [id number]  Colletta Manuelita Nat DEVONNA Inpatient   06/17/2023 2313 06/22/2023 1658 Full Code [id number]  Lawence Madison LABOR, MD ED   05/01/2016 1907 05/06/2016 1426 Full Code [id number]  Josette Ade, MD ED      Advance Directive Documentation    Flowsheet Row Most Recent Value  Type of Advance Directive Living will  Pre-existing out of facility DNR order (yellow form or pink MOST form) --  MOST Form in Place? --      IV Access:   Peripheral IV   Procedures and diagnostic studies:   No results found.    Medical Consultants:   None.   Subjective:    Roberto [name] currently asymptomatic denies any chest pain or shortness of breath  Objective:    Vitals:   05/12/24 1630 05/12/24 1900 05/13/24 0001 05/13/24 0515  BP: 111/76 98/81 (!) 111/99 100/88  Pulse:  100 90 80  Resp: 17 18 18 19   Temp: 97.7 F (36.5 C) 97.7 F (36.5 C) 98.1 F (36.7 C) 97.7 F (36.5 C)  TempSrc: Oral Oral Oral Oral  SpO2:  97% 93% 93%  Weight:    100.7 kg  Height:    5' 6 (1.676 m)   SpO2: 93 % O2 Flow Rate (L/min): 1 L/min   Intake/Output Summary (Last 24 hours) at 05/13/2024 0727 Last data filed at 05/13/2024 0600 Gross per 24 hour  Intake 360 ml  Output 1300 ml  Net -940 ml   Filed Weights   05/09/24 1824 05/13/24 0515  Weight: 99.6 kg 100.7 kg    Exam: General exam: In no acute distress. Respiratory system: Good air movement and clear to auscultation. Cardiovascular system: S1 & S2 heard, RRR. No JVD. Gastrointestinal system: Abdomen is nondistended, soft and nontender.  Extremities: No pedal edema. Skin: No rashes, lesions or ulcers Psychiatry: Judgement and insight appear normal. Mood & affect appropriate. Data Reviewed:    Labs: Basic Metabolic Panel: Recent Labs  Lab 05/07/24 0458 05/07/24 0826 05/08/24 0215 05/09/24 0318 05/09/24 1945 05/11/24 0758 05/12/24 0252 05/13/24 0258  NA  --    < > 140  140 139 138 141 145  K  --    < > 3.4* 4.7 3.5 3.6 3.4* 3.3*  CL  --    < > 100 101 102 100 107 108  CO2  --    < > 26 28 20* 25 21* 25  GLUCOSE  --    < > 116* 93 125* 102* 111* 99  BUN  --    < > 23 24* 23 21 21 17   CREATININE  --    < > 1.61* 1.63* 1.85* 1.79* 1.86* 1.67*  CALCIUM   --    < > 8.9 8.7* 9.0 9.4 9.1 8.8*  MG 2.2  --  2.0 2.2 1.7  --   --   --    < > = values in this interval not displayed.   GFR Estimated Creatinine Clearance: 46.4 mL/min (A) (by C-G formula based on SCr of 1.67 mg/dL (H)). Liver Function Tests: Recent Labs  Lab 05/07/24 0826 05/08/24 0215 05/09/24 0318 05/09/24 1945 05/13/24 0258  AST 208* 107* 64* 47* 37  ALT 257* 196* 147* 121* 66*  ALKPHOS 68 74 72 72 76  BILITOT 1.0 0.9 1.5* 0.7 0.5  PROT 6.8 6.9 7.4 6.3* 6.1*  ALBUMIN 3.0* 3.1* 3.2* 3.1* 2.8*   No results for input(s): LIPASE, AMYLASE in the last 168 hours. No results for input(s): AMMONIA in the last 168 hours.  Coagulation profile Recent Labs  Lab 05/09/24 0318 05/09/24 1945 05/11/24 0758 05/12/24 0252 05/13/24 0258  INR 1.6* 2.2* 2.7* 2.8* 2.7*   COVID-19 Labs  No results for input(s): DDIMER, FERRITIN, LDH, CRP in the last 72 hours.  Lab Results  Component Value Date   SARSCOV2NAA NEGATIVE 05/05/2024   SARSCOV2NAA NEGATIVE 08/21/2023   SARSCOV2NAA NEGATIVE 03/25/2019   SARSCOV2NAA NEGATIVE 01/24/2019    CBC: Recent Labs  Lab 05/08/24 0215 05/09/24 1945 05/11/24 0758 05/12/24 0252 05/13/24 0258  WBC 7.3 7.8 6.7 7.9 9.6  NEUTROABS  --  6.2  --   --   --   HGB 12.1* 12.2* 12.1* 11.8* 12.0*  HCT 38.6* 38.0* 38.4* 38.1* 38.4*  MCV 91.3 91.3 91.4 92.0 91.9  PLT 274 330 318 340 324   Cardiac Enzymes: No results for input(s): CKTOTAL, CKMB, CKMBINDEX, TROPONINI in the last 168 hours.  BNP (last 3 results) No results for input(s): PROBNP in the last 8760 hours. CBG: Recent Labs  Lab 05/06/24 0735 05/06/24 1238 05/06/24 1639  GLUCAP  85 132* 106*   D-Dimer: No results for input(s): DDIMER in the last 72 hours. Hgb A1c: No results for input(s): HGBA1C in the last 72 hours. Lipid Profile: No results for input(s): CHOL, HDL, LDLCALC, TRIG, CHOLHDL, LDLDIRECT in the last 72 hours. Thyroid  function studies: No results for input(s): TSH, T4TOTAL, T3FREE, THYROIDAB in the last 72 hours.  Invalid input(s): FREET3 Anemia work up: No results for input(s): VITAMINB12, FOLATE, FERRITIN, TIBC, IRON, RETICCTPCT in the last 72 hours. Sepsis Labs: Recent Labs  Lab 05/09/24 1945 05/11/24 0758 05/12/24 0252 05/13/24 0258  WBC 7.8 6.7 7.9 9.6   Microbiology Recent Results (from the past 240 hours)  Culture, blood (routine x 2)     Status: None   Collection Time: 05/05/24  1:10 AM   Specimen: Right Antecubital; Blood  Result Value Ref Range Status   Specimen Description RIGHT ANTECUBITAL  Final   Special Requests   Final    BOTTLES DRAWN AEROBIC AND ANAEROBIC Blood Culture adequate volume   Culture   Final    NO GROWTH 5 DAYS Performed at [facility], [address] [place], [place] [address]    Report Status 05/10/2024 FINAL  Final  Culture, blood (routine x 2)     Status: None   Collection Time: 05/05/24  1:14 AM   Specimen: Left Antecubital; Blood  Result Value Ref Range Status   Specimen Description LEFT ANTECUBITAL  Final   Special Requests   Final    BOTTLES DRAWN AEROBIC AND ANAEROBIC Blood Culture adequate volume   Culture   Final    NO GROWTH 5 DAYS Performed at [facility], [address] [place], [place] [address]    Report Status 05/10/2024 FINAL  Final  Resp panel by RT-PCR (RSV, Flu A&B, Covid) Anterior Nasal Swab     Status: None   Collection Time: 05/05/24  1:14 AM   Specimen: Anterior Nasal Swab  Result Value Ref Range Status   SARS Coronavirus 2 by RT PCR NEGATIVE NEGATIVE Final    Comment: (NOTE) SARS-CoV-2 target nucleic acids are  NOT DETECTED.  The SARS-CoV-2 RNA is generally  detectable in upper respiratory specimens during the acute phase of infection. The lowest concentration of SARS-CoV-2 viral copies this assay can detect is 138 copies/mL. A negative result does not preclude SARS-Cov-2 infection and should not be used as the sole basis for treatment or other patient management decisions. A negative result may occur with  improper specimen collection/handling, submission of specimen other than nasopharyngeal swab, presence of viral mutation(s) within the areas targeted by this assay, and inadequate number of viral copies(<138 copies/mL). A negative result must be combined with clinical observations, patient history, and epidemiological information. The expected result is Negative.  Fact Sheet for Patients:  bloggercourse.com  Fact Sheet for Healthcare Providers:  seriousbroker.it  This test is no t yet approved or cleared by the [place]  FDA and  has been authorized for detection and/or diagnosis of SARS-CoV-2 by FDA under an Emergency Use Authorization (EUA). This EUA will remain  in effect (meaning this test can be used) for the duration of the COVID-19 declaration under Section 564(b)(1) of the Act, 21 U.S.C.section 360bbb-3(b)(1), unless the authorization is terminated  or revoked sooner.       Influenza A by PCR NEGATIVE NEGATIVE Final   Influenza B by PCR NEGATIVE NEGATIVE Final    Comment: (NOTE) The Xpert Xpress SARS-CoV-2/FLU/RSV plus assay is intended as an aid in the diagnosis of influenza from Nasopharyngeal swab specimens and should not be used as a sole basis for treatment. Nasal washings and aspirates are unacceptable for Xpert Xpress SARS-CoV-2/FLU/RSV testing.  Fact Sheet for Patients: bloggercourse.com  Fact Sheet for Healthcare Providers: seriousbroker.it  This test is not  yet approved or cleared by the [place]  FDA and has been authorized for detection and/or diagnosis of SARS-CoV-2 by FDA under an Emergency Use Authorization (EUA). This EUA will remain in effect (meaning this test can be used) for the duration of the COVID-19 declaration under Section 564(b)(1) of the Act, 21 U.S.C. section 360bbb-3(b)(1), unless the authorization is terminated or revoked.     Resp Syncytial Virus by PCR NEGATIVE NEGATIVE Final    Comment: (NOTE) Fact Sheet for Patients: bloggercourse.com  Fact Sheet for Healthcare Providers: seriousbroker.it  This test is not yet approved or cleared by the [place]  FDA and has been authorized for detection and/or diagnosis of SARS-CoV-2 by FDA under an Emergency Use Authorization (EUA). This EUA will remain in effect (meaning this test can be used) for the duration of the COVID-19 declaration under Section 564(b)(1) of the Act, 21 U.S.C. section 360bbb-3(b)(1), unless the authorization is terminated or revoked.  Performed at [facility], [address] [place], [place] [address]      Medications:    acetaminophen   650 mg Oral TID   allopurinol   100 mg Oral Daily   amiodarone   200 mg Oral BID   baclofen   10 mg Oral BID WC   budesonide-glycopyrrolate-formoterol   2 puff Inhalation BID   buPROPion   300 mg Oral Daily   dapagliflozin  propanediol  10 mg Oral Daily   furosemide   80 mg Intravenous BID   mexiletine  150 mg Oral TID   midodrine   5 mg Oral TID WC   nortriptyline   10 mg Oral QHS   pantoprazole   40 mg Oral BID   potassium chloride   40 mEq Oral Q4H   rosuvastatin   40 mg Oral Daily   spironolactone   25 mg Oral Daily   Warfarin - Pharmacist Dosing Inpatient   Does not apply q1600   Continuous Infusions:  LOS: 0 days   Roberto [name]  [facility]  05/13/2024, 7:27 AM

## 2024-05-13 NOTE — Progress Notes (Signed)
 PHARMACY - ANTICOAGULATION CONSULT NOTE  Pharmacy Consult for warfarin Indication: mechanical aortic valve, afib  Allergies  Allergen Reactions   Latex Hives   Nicotine  Hives and Rash    Patches caused localized rash and hives     Patient Measurements: Height: 5' 6 (167.6 cm) Weight: 100.7 kg (222 lb 0.1 oz) IBW/kg (Calculated) : 63.8 HEPARIN  DW (KG): 86  Vital Signs: Temp: 98.3 F (36.8 C) (11/11 0719) Temp Source: Oral (11/11 0719) BP: 113/75 (11/11 0719) Pulse Rate: 110 (11/11 0719)  Labs: Recent Labs    05/11/24 0758 05/12/24 0252 05/13/24 0258  HGB 12.1* 11.8* 12.0*  HCT 38.4* 38.1* 38.4*  PLT 318 340 324  LABPROT 30.2* 30.5* 30.3*  INR 2.7* 2.8* 2.7*  CREATININE 1.79* 1.86* 1.67*    Estimated Creatinine Clearance: 46.4 mL/min (A) (by C-G formula based on SCr of 1.67 mg/dL (H)).   Medical History: Past Medical History:  Diagnosis Date   Aneurysm    Bicuspid aortic valve    a. s/p #27 [employer] mechanical valve on 03/25/2010; b. on Coumadin ; c. TTE 12/17: EF 40-45%, moderately dilated LV with moderate LVH, AVR well-seated with 14 mmHg gradient, peak AV velocity 2.5 m/s, mild mitral valve thickening with mild MR, mildly dilated RV with mildly reduced contraction   Cellulitis    CHF (congestive heart failure) (HCC)    Chronic kidney disease    Chronic systolic CHF (congestive heart failure) (HCC)    a. R/LHC 03/2010 showed no significant CAD, LVEDP 31 mmHg, mean AoV gradient 34 mmHg at rest and 47 mmHg with dobutamine 20 mcg/kg/min, AVA 1.0 cm^2, RA 31, RV 68/25, PA 68/47, PCWP 38. PA sat 65%. CO 6.2 L/min (Fick) and 5.3 L/min (thermodilution)   Clotting disorder    COPD (chronic obstructive pulmonary disease) (HCC)    H/O mechanical aortic valve replacement 03/25/2010   a. #27 [employer] mechanical valve   Hearing loss    High cholesterol    HTN (hypertension)    Hypercholesterolemia    Renal infarct 2017   Multiple right renal infarcts, likely  embolic.   Stage 3 chronic kidney disease (HCC)    Stroke (HCC)    TIA (transient ischemic attack) 05/2014    Medications:  -Warfarin/lovenox  bridge: Per PCP note on 04/23/24, patient has recently been struggling to eat without vomiting and, therefore, struggling to maintain goal INR levels so the patient was switched from warfarin to enoxaparin  (03/25/24). Plan was to transition back to warfarin once patient could tolerate oral intake   -Patient was cycling through warfarin and dosing as 4mg  PO on one day, 4mg  PO the next, and then 7.5mg  PO and continuing cycle  Assessment: 86 yoM presented s/p fall and txr from [facility]. PMH includes mAVR (warfarin), non-obstructive CAD, thoracic aortic aneurysm, CVA, atrial fibrillation, HFrEF (LVEF 20%), Vtach s/p CRT-D, HLD. Pharmacy consulted to dose warfarin while patient in hospital.  Home amiodarone  200mg  BID was restarted per EP which was being held in the setting of elevated LFTs since they are now downtrending. Since amiodarone  has the potential to interact with warfarin and increase INR, will give a dose of 2mg  warfarin today  11/10 INR 2.8.  No overt bleeding or complications noted.  Goal of Therapy:  INR 2.5-3.5 Monitor platelets by anticoagulation protocol: Yes   Plan:  Warfarin 4 mg po x 1 again tonight. Continue daily INR.  Harlene Barlow, [employer], BCCP Clinical Pharmacist  05/13/2024 7:49 AM   [facility] pharmacy phone numbers are listed  on [email or web address]

## 2024-05-13 NOTE — Progress Notes (Signed)
 Wife called for update regarding device upgrade. Reviewed indications for upgrade / addition of atrial lead.    Daphne Barrack, NP-C, AGACNP-BC [facility] - Electrophysiology  05/13/2024, 4:46 PM

## 2024-05-13 NOTE — Discharge Instructions (Signed)
 After Your ICD (Implantable Cardiac Defibrillator)   You have a Abbott ICD  If you have a Medtronic or Biotronik device, plug in your home monitor once you get home, and no manual interaction is required.   If you have an Abbott or Autozone device, plug your home monitor once you get home, sit near the device, and press the large activation button. Sit nearby until the process is complete, usually notated by lights on the monitor.   If you were set up for monitoring using an app on your phone, make sure the app remains open in the background and the Bluetooth remains on.  ACTIVITY Do not lift your arm above shoulder height for 1 week after your procedure. After 7 days, you may progress as below.  You should remove your sling 24 hours after your procedure, unless otherwise instructed by your provider.     Tuesday May 20, 2024  Wednesday May 21, 2024 Thursday May 22, 2024 Friday May 23, 2024   Do not lift, push, pull, or carry anything over 10 pounds with the affected arm until 6 weeks (Tuesday June 24, 2024 ) after your procedure.   You may drive AFTER your wound check, unless you have been told otherwise by your provider.   Ask your healthcare provider when you can go back to work   INCISION/Dressing Coumadin   -----  If large square, outer bandage is left in place, this can be removed after 24 hours from your procedure. Do not remove steri-strips or glue as below.   Monitor your defibrillator site for redness, swelling, and drainage. Call the device clinic at [phone] if you experience these symptoms or fever/chills.    If your incision is sealed with Steri-strips or staples, you may shower 7 days after your procedure or when told by your provider. Do not remove the steri-strips or let the shower hit directly on your site. You may wash around your site with soap and water .    If you were discharged in a sling, please do not wear this during the  day more than 48 hours after your surgery unless otherwise instructed. This may increase the risk of stiffness and soreness in your shoulder.   Avoid lotions, ointments, or perfumes over your incision until it is well-healed.  You may use a hot tub or a pool AFTER your wound check appointment if the incision is completely closed.  Your ICD is designed to protect you from life threatening heart rhythms. Because of this, you may receive a shock.   1 shock with no symptoms:  Call the office during business hours. 1 shock with symptoms (chest pain, chest pressure, dizziness, lightheadedness, shortness of breath, overall feeling unwell):  Call 911. If you experience 2 or more shocks in 24 hours:  Call 911. If you receive a shock, you should not drive for 6 months per the [place] DMV IF you receive appropriate therapy from your ICD.   ICD Alerts:  Some alerts are vibratory and others beep. These are NOT emergencies. Please call our office to let us  know. If this occurs at night or on weekends, it can wait until the next business day. Send a remote transmission.  If your device is capable of reading fluid status (for heart failure), you will be offered monthly monitoring to review this with you.   DEVICE MANAGEMENT Remote monitoring is used to monitor your ICD from home. This monitoring is scheduled every 91 days by our office. It allows us   to keep an eye on the functioning of your device to ensure it is working properly. You will routinely see your Electrophysiologist annually (more often if necessary). This will appear as a REMOTE check on your MyChart schedule. These are automatic and there is nothing for you to manually do unless otherwise instructed.  You should receive your ID card for your new device in 4-8 weeks. Keep this card with you at all times once received. Consider wearing a medical alert bracelet or necklace.  Your ICD  may be MRI compatible. This will be discussed at your next office  visit/wound check.  You should avoid contact with strong electric or magnetic fields.   Do not use amateur (ham) radio equipment or electric (arc) welding torches. MP3 player headphones with magnets should not be used. Some devices are safe to use if held at least 12 inches (30 cm) from your defibrillator. These include power tools, lawn mowers, and speakers. If you are unsure if something is safe to use, ask your health care provider.  When using your cell phone, hold it to the ear that is on the opposite side from the defibrillator. Do not leave your cell phone in a pocket over the defibrillator.  You may safely use electric blankets, heating pads, computers, and microwave ovens.  Call the office right away if: You have chest pain. You feel more than one shock. You feel more short of breath than you have felt before. You feel more light-headed than you have felt before. Your incision starts to open up.  This information is not intended to replace advice given to you by your health care provider. Make sure you discuss any questions you have with your health care provider.   Food Pantry: [facility] [employer] Address: [address] [place], [place] [address] Phone Number: [phone] Hours of Operation: Residents of [place] can come to obtain food Monday through Friday from 8:30am until 3:30pm. Photo ID and Social Security cards required for all residents of a household. Can come six times a year  [facility] Address: [address] [place], [place] [address] Phone Number: [phone] Hours of Operation: Operates Tuesday-Friday 10:00 a.m. to 1 p.m. Requirements: Referral from DSS needed. May come 6 times a year, 30 days apart. Photo ID and SS required for all residents of household.  [facility] Address: [address] [place], [place] [address] Phone Number: [phone] Hours of Operation: Food pantry is open on the last Saturday of each  month from 10:00 am - 12:00 noon. No appointment needed. No qualifications.  [facility] Address: [address] , [place] [address] Phone Number: [phone] EXT. [phone] Hours of Operation: Must make reservations to pick up food on Saturdays. Sign ups for Saturday pick up beginning at 8:30 a.m. on Monday morning.  [facility] Address: [address] [place], [place] [address] Phone Number: [phone] Hours of Operation: If you need food, bring proper identification such as a driver's license to receive a bag of food once a month. Requirements: Can come once every 30 days with referral DSS, Holiday Representative, Mental health etc. Each referral good for six visits. Photo ID required. *1st visit no referral required.  [facility] Address: [address] [place], [place] [address] Phone Number: [phone]  [facility] Address: [address] [place], [place] [address] Phone Number: [phone] Hours of Operation:  You can register at [email or web address] for free groceries  [facility] Address: [address] [place], [place] [address] Phone Number: [phone] Hours of Operation: Drive through giveaway, first come first served. Every 3rd Saturday 11AM - 1PM  [facility] [address] Address: [address], [place] [address] Phone Number: [phone]   [place]  [employer] [facility] [employer] [facility] Address: [address] [place], [place] [address] Phone Number: [phone] Hours of Operation: Once a month every 3rd Saturday  [facility] Address: [address] [place], [place] [address] Phone Number: [phone] Hours of Operation: Distribution happens from 9:00-10:00 a.m. every Saturday.     [facility] Address: [address] [place], [place] [address] Phone Number: [phone] Hours of Operation: ONCE a week for the community food distribution held every  Tuesday, Wednesday and Thursday from 11 a.m. - 2:00 p.m. Food is available on a first come, first serve basis and varies week to week. No appointment necessary for drive thru pick up.  [facility] Address: [address] [place], [place] [address] Phone Number: [phone] Hours of Operation: Open every 3rd Thursday 9:30 a.m. - 11:00 a.m.  [facility] Address: [address] [place], [place] [address] Phone Number: [phone] Hours of Operation: Please call for hours, directions, and questions  [facility] [place] [facility] Address: [address] [place], [place]  [address] Phone Number: [phone] Website: https://www.hollyguns.co.za Food Finder app: https://findfood.ghpfa.org  [employer] Address: [address] [place], [place] [address] Phone Number: [phone] Hours of Operation: Contact Bree Harpe. Enrolled Substance Abuse Clients Only  [facility] Address: [address] [place], [address]  Phone Number: [phone] Hours of Operation: Contact Bartley Irving. Food pantry open the 3rd Saturday of each month from 9 a.m. -12 p.m. only  [place] [facility] Address: [address] [place], [place] [address] Phone Number: [phone] Hours of Operation: Contact Geni Lee. Emergency food bank open on Saturdays by appointment only  [facility] Address: [address] [place], [place] [address] Phone Number: [phone] Hours of Operation: No specific contact person; Anyone can help  [facility] Address: [address] [place], [place] [address] Phone Number: [phone] Hours of Operation: Contact Medford Molt. Agency gives out a bag of food every Thursday from 2-4 p.m. only, and also provides a community meal every Thursday between 5-6 p.m. Other services provided include rent/mortgage and utility assistance, women's winter shelter, thrift store, and senior adult  activities.  [facility] [place] Address: [address] [place], [place] [address] Phone Number: [phone] Hours of Operation: The Emergency Food Assistance Program provides individuals and families with a generous supply of food including meat, fresh vegetables, and nonperishable items. The food box contains five days' worth of food, and each family or individual can receive a box once per month. M, W, Th, Fr 11am-2pm, walk-ins welcome.  [facility] Address: [address] [place], [place] [address]  Phone Number: [phone] Hours of Operation: Contact Asia Cathlean. Tuesdays and Thursdays from 11am - 3pm by appointment only   Social Connections   PACE (Adult Program)  - Address: [address] [place], [place] [address] - General office #:  [phone] - Enrollment Phone #: [phone]  [facility]  - Senior Friendship Line: call toll free, available 24 hours a day, at [phone]   [place] [address]  [place] 2-1-1 is another useful way to  locate resources in the community. Visit shedsizes.ch to find service information online. If you need additional assistance, 2-1-1 Referral Specialists are available 24 hours a day, every day by dialing 2-1-1 or [phone] from any phone. The call is free, confidential, and available in any language.

## 2024-05-13 NOTE — Progress Notes (Addendum)
 Advanced Heart Failure Rounding Note  Cardiologist: [name], MD  HF Cardiologist: Dr. Rolan  Chief Complaint: CHF  Subjective:    Became agitated overnight. Removed telemetry leads and attempted to pull out his IVs. He was started on zyprexa.  Is/Os not complete. Weighed in bed. Continues on IV lasix  80 BID.   Objective:   Weight Range: 100.7 kg Body mass index is 35.83 kg/m.   Vital Signs:   Temp:  [97.7 F (36.5 C)-98.1 F (36.7 C)] 97.7 F (36.5 C) (11/11 0515) Pulse Rate:  [80-100] 80 (11/11 0515) Resp:  [17-20] 19 (11/11 0515) BP: (95-112)/(68-99) 100/88 (11/11 0515) SpO2:  [93 %-98 %] 93 % (11/11 0515) Weight:  [100.7 kg] 100.7 kg (11/11 0515) Last BM Date : 05/12/24  Weight change: Filed Weights   05/09/24 1824 05/13/24 0515  Weight: 99.6 kg 100.7 kg    Intake/Output:   Intake/Output Summary (Last 24 hours) at 05/13/2024 0704 Last data filed at 05/13/2024 0600 Gross per 24 hour  Intake 360 ml  Output 1300 ml  Net -940 ml      Physical Exam    General:  Chronically ill appearing, looks older than stated age. Cor: JVP midneck. Regular rate & rhythm. No murmurs. Lungs: Breathing nonlabored Abdomen: Soft, nontender, nondistended. Extremities: No edema Neuro: Alert and oriented X 4 on my assessment. Tearful.   Telemetry   V paced 80, ? Underlying atrial fibrillation Labs    CBC Recent Labs    05/12/24 0252 05/13/24 0258  WBC 7.9 9.6  HGB 11.8* 12.0*  HCT 38.1* 38.4*  MCV 92.0 91.9  PLT 340 324   Basic Metabolic Panel Recent Labs    88/89/74 0252 05/13/24 0258  NA 141 145  K 3.4* 3.3*  CL 107 108  CO2 21* 25  GLUCOSE 111* 99  BUN 21 17  CREATININE 1.86* 1.67*  CALCIUM  9.1 8.8*   Liver Function Tests Recent Labs    05/13/24 0258  AST 37  ALT 66*  ALKPHOS 76  BILITOT 0.5  PROT 6.1*  ALBUMIN 2.8*   No results for input(s): LIPASE, AMYLASE in the last 72 hours. Cardiac Enzymes No results for  input(s): CKTOTAL, CKMB, CKMBINDEX, TROPONINI in the last 72 hours.  BNP: BNP (last 3 results) Recent Labs    03/19/24 1600 05/05/24 0114 05/09/24 1945  BNP 773.9* 470.9* 749.4*    ProBNP (last 3 results) No results for input(s): PROBNP in the last 8760 hours.   D-Dimer No results for input(s): DDIMER in the last 72 hours. Hemoglobin A1C No results for input(s): HGBA1C in the last 72 hours. Fasting Lipid Panel No results for input(s): CHOL, HDL, LDLCALC, TRIG, CHOLHDL, LDLDIRECT in the last 72 hours. Thyroid  Function Tests No results for input(s): TSH, T4TOTAL, T3FREE, THYROIDAB in the last 72 hours.  Invalid input(s): FREET3  Other results:   Imaging    No results found.   Medications:     Scheduled Medications:  acetaminophen   650 mg Oral TID   allopurinol   100 mg Oral Daily   amiodarone   200 mg Oral BID   baclofen   10 mg Oral BID WC   budesonide-glycopyrrolate-formoterol   2 puff Inhalation BID   buPROPion   300 mg Oral Daily   furosemide   80 mg Intravenous BID   mexiletine  150 mg Oral TID   midodrine   5 mg Oral TID WC   nortriptyline   10 mg Oral QHS   pantoprazole   40 mg Oral BID  rosuvastatin   40 mg Oral Daily   spironolactone   12.5 mg Oral Daily   Warfarin - Pharmacist Dosing Inpatient   Does not apply q1600    Infusions:   PRN Medications: albuterol , OLANZapine zydis, traMADol, traZODone    Assessment/Plan   1. Acute on chronic systolic CHF with prominent RV dysfunction:  Nonischemic cardiomyopathy.  St Jude CRT-D device. Nonobstructive CAD on caths in 7/20 and 12/24. Cardiac MRI in 12/24 showed LV EF 20%, RV EF 41%, mid-myocardial basal septal LGE (nonspecific non-coronary pattern).  Echo in 3/25 showed EF 25-30% with severe LV dilation, normal RV size with mild systolic dysfunction, mechanical aortic valve with mean gradient 13 mmHg. RHC in 9/25 showed predominantly RV failure with mildly decreased CI by Fick  (2.2) and preserved CI by thermo (2.38) with PAPi 1.5. Torsemide  restarted. Echo this admission with EF < 20%, moderate RV dysfunction with mild RV enlargement, mechanical aortic valve ok, IVC dilated.  - Suspect episode of delirium overnight. With changes in mental status and advanced heart failure, will check lactic acid. Although does not appear cold on exam. - Volume improving but still appears overloaded. Continue IV lasix  80 mg BID.  - Have weaned midodrine  down to 5 mg TID, SBP 100s-110s, occasionally upper 90s. Will try to reduce further to 2.5 mg TID. - Increase spironolactone  to 25 mg daily - Restart Farxiga  10 mg daily - Probably would not be a candidate for advanced therapies with renal dysfunction and deconditioning/frailty; ascending aortic aneurysm is also a concern.  However, CO was not markedly low on last RHC.  2. Atrial fibrillation/flutter: DCCV in 2/25.  He looks like he is possibly in AF now. Had recent inappropriate ICD shock in setting of SVT.  - ECG completed yesterday to confirm rhythm, will review with Dr. Rolan.  - With inappropriate shock, would be reasonable to proceed with AV nodal ablation or atrial lead.  - He is anticoagulated with Warfarin. 3. OSA: Moderate OSA, waiting for CPAP.  4. CAD: Nonobstructive on 7/20 and 12/24 caths.  I would aim for LDL at least < 70.  - Continue Crestor  40 mg daily.    5. Mechanical aortic valve: Stable function on echo this admission.  Given history of CVA, should have Lovenox  bridging when he needs to hold warfarin.  Goal INR 2.5-3.5 with additional history of atrial fibrillation.   6. Smoking: We discussed cessation.   7. Ascending aortic aneurysm: Suspect this is related to his bicuspid aortic valve.  Last CT chest w/o contrast in 3/25 showed aneurysm 5.6 cm.  He would be high risk for repair.  This will also complicate consideration of LVAD.  8. Obesity: he tolerate GLP-1 agonist poorly with AKI/dehydration.  9. VT: He has St  Jude CRT-D.  - On amiodarone  per EP, LFTs have been elevated (?due to hepatic congestion with RV failure).  Now improving with diuresis - Continue mexiletine 150 mg tid.  10. CKD stage 3: Creatinine baseline 1.6-1.9. - Scr 1.67 today - Follow with diuresis and reintroducing GDMT 11. AMS: ? Delirium overnight. He was started on zyprexa - Had evidence of cirrhosis on US  abdomen. Check ammonia level.  - Check lactic acid - UA 4 days ago w/o concern for UTI   Length of Stay: 0  FINCH, LINDSAY N, [name]  05/13/2024, 7:04 AM  Advanced Heart Failure Team Pager [phone] (M-F; 7a - 5p)  Please contact [facility] for night-coverage after hours (5p -7a ) and weekends on [email or web address]   Patient  seen with PA, I formulated the plan and agree with the above note.   I/Os not recorded well, patient says he urinated a lot with IV Lasix .   Creatinine 1.86 => 1.67.  MAP stable, titrating down midodrine .   Delirium overnight, now much more clear.  Check ammonia and lactate though do not suspect low output state.   ECG reviewed, has non-conducted sinus beats (no atrial lead) and BiV pacing.   General: NAD Neck: JVP 10-12 cm, no thyromegaly or thyroid  nodule.  Lungs: Clear to auscultation bilaterally with normal respiratory effort. CV: Nondisplaced PMI.  Heart regular S1/S2, no S3/S4, no murmur.  No peripheral edema.    Abdomen: Soft, nontender, no hepatosplenomegaly, no distention.  Skin: Intact without lesions or rashes.  Neurologic: Alert and oriented x 3.  Psych: Normal affect. Extremities: No clubbing or cyanosis.  HEENT: Normal.   Still volume overloaded, suspect predominantly RV failure based on prior RHC.  Continue Lasix  80 mg IV bid today.    Continue to wean down midodrine , decrease to 2.5 tid today and can restart Farxiga  10 mg daily and increase spironolactone  to 25 mg daily.   Rhythm appears to be sinus with nonconducted P's (undetected with no atrial lead) on ECG and BiV pacing.   He had suspected SVT with inappropriate shock at time of admission.  Tentative plan for placement of atrial lead today to detect atrial activity and allow A-V synchrony.   Ezra Shuck 05/13/2024 8:14 AM

## 2024-05-13 NOTE — Progress Notes (Signed)
 Met w/ pt and wife; discussed pt confusion at times last pm; discussed tramadol and times he used at home vs here at hospital; discussed melatonin and request to give earlier than 10pm.  Pt wife states she feels he is not sleeping well at hospital.  Support provided.

## 2024-05-13 NOTE — TOC Initial Note (Signed)
 Transition of [facility]) - Initial/Assessment Note    Patient Details  Name: Roberto [name] MRN: [id number] Date of Birth: 16-Mar-1956  Transition of Care [facility]) CM/SW Contact:    Arlana JINNY Nicholaus ISRAEL Phone Number: [phone] 05/13/2024, 1:12 PM  Clinical Narrative:    11:58 AM- HF CSW attempted to meet with patient at bedside. Patient was not in the room at the time. CSW/CM will follow up at a more appropriate time.   HF CSW/CM will continue to follow and monitor for dc readiness.                      Patient Goals and CMS Choice            Expected Discharge Plan and Services                                              Prior Living Arrangements/Services                       Activities of Daily Living   ADL Screening (condition at time of admission) Independently performs ADLs?: Yes (appropriate for developmental age) Is the patient deaf or have difficulty hearing?: Yes Does the patient have difficulty seeing, even when wearing glasses/contacts?: Yes Does the patient have difficulty concentrating, remembering, or making decisions?: Yes  Permission Sought/Granted                  Emotional Assessment              Admission diagnosis:  Syncope and collapse [R55] Syncope [R55] Patient Active Problem List   Diagnosis Date Noted   Syncope 05/13/2024   ICD (implantable cardioverter-defibrillator) discharge 05/11/2024   Syncope and collapse 05/10/2024   OSA (obstructive sleep apnea) 05/10/2024   Altered mental status 05/07/2024   Lactic acidosis 05/07/2024   Acute pulmonary edema (HCC) 05/05/2024   Acute respiratory failure with hypoxia (HCC) 05/05/2024   Elevated troponin 05/05/2024   Transaminitis 05/05/2024   Prolonged QT interval 05/05/2024   Normocytic anemia 05/01/2024   Gout 05/01/2024   Paroxysmal atrial fibrillation (HCC) 05/01/2024   Type 2 diabetes mellitus with chronic kidney disease, without long-term current  use of insulin  (HCC) 05/01/2024   Dyslipidemia 05/01/2024   Essential hypertension 05/01/2024   Hypokalemia 05/01/2024   Encephalopathy 04/30/2024   Esophageal dysmotility 03/28/2024   Indigestion 03/24/2024   Unintentional weight loss 03/07/2024   Presence of automatic (implantable) cardiac defibrillator 03/07/2024   Presence of heart assist device (HCC) 02/15/2024   Polyp of colon 12/13/2023   PTSD (post-traumatic stress disorder) 11/15/2023   DM type 2 with diabetic peripheral neuropathy (HCC) 10/23/2023   Essential tremor 10/23/2023   Pacemaker 09/11/2023   Persistent atrial fibrillation (HCC) 07/12/2023   Acute on chronic HFrEF (heart failure with reduced ejection fraction) (HCC) 06/22/2023   VT (ventricular tachycardia) (HCC) 06/18/2023   Hypotension 06/18/2023   Atrial fibrillation (HCC) 06/17/2023   Depression 06/17/2023   Tremor of left hand 05/29/2022   Aneurysm of ascending aorta without rupture 02/17/2022   Anticoagulated on Coumadin  07/29/2021   Chronic kidney disease, stage 3a (HCC) 12/09/2020   Hyperlipidemia associated with type 2 diabetes mellitus (HCC) 12/09/2020   Coronary artery disease involving native coronary artery of native heart without angina pectoris 09/10/2019   Osteoarthritis of spine with radiculopathy, lumbar region  11/21/2018   History of aortic stenosis 06/05/2018   Chronic gout without tophus 04/03/2018   Valvular heart disease 01/17/2018   Morbid obesity (HCC) 01/17/2018   Chronic systolic heart failure (HCC) 09/27/2016   Renal infarct 05/01/2016   Type 2 diabetes mellitus with cardiac complication (HCC) 04/19/2016   Chronic bilateral low back pain without sciatica 04/19/2016   Thoracic aortic aneurysm without rupture 03/16/2016   Aortic atherosclerosis 03/16/2016   Nicotine  dependence, cigarettes, uncomplicated 03/14/2016   NICM (nonischemic cardiomyopathy) (HCC) 03/02/2016   COPD (chronic obstructive pulmonary disease) (HCC) 09/09/2015    H/O mechanical aortic valve replacement 02/09/2015   Hypertension associated with diabetes (HCC) 02/09/2015   PCP:  [name], DO Pharmacy:   [facility] [address] ([address]), [place] - [address] [place]) [place] [address] Phone: [phone] Fax: [phone]  [name] [address] [place] [address] Phone: [phone] Fax: [phone]     Social Drivers of Health (SDOH) Social History: SDOH Screenings   Food Insecurity: Food Insecurity Present (05/10/2024)  Housing: Low Risk  (05/10/2024)  Transportation Needs: No Transportation Needs (05/10/2024)  Recent Concern: Transportation Needs - Unmet Transportation Needs (05/01/2024)  Utilities: Not At Risk (05/10/2024)  Alcohol Screen: Low Risk  (09/04/2023)  Depression (PHQ2-9): High Risk (03/28/2024)  Financial Resource Strain: High Risk (04/21/2024)  Physical Activity: Inactive (04/21/2024)  Social Connections: Socially Isolated (05/10/2024)  Stress: Stress Concern Present (04/21/2024)  Tobacco Use: High Risk (05/09/2024)  Health Literacy: Adequate Health Literacy (09/04/2023)   SDOH Interventions:     Readmission Risk Interventions    08/29/2023    3:19 PM 06/18/2023   10:33 AM  Readmission Risk Prevention Plan  Transportation Screening Complete Complete  PCP or Specialist Appt within 3-5 Days  Complete  HRI or Home Care Consult Complete Complete  Social Work Consult for Recovery Care Planning/Counseling Complete Patient refused  Palliative Care Screening Not Applicable Not Applicable  Medication Review Oceanographer) Referral to Pharmacy Complete

## 2024-05-14 ENCOUNTER — Encounter (HOSPITAL_COMMUNITY): Payer: Self-pay | Admitting: Internal Medicine

## 2024-05-14 ENCOUNTER — Inpatient Hospital Stay (HOSPITAL_COMMUNITY)

## 2024-05-14 DIAGNOSIS — I4819 Other persistent atrial fibrillation: Secondary | ICD-10-CM | POA: Diagnosis not present

## 2024-05-14 DIAGNOSIS — R55 Syncope and collapse: Secondary | ICD-10-CM | POA: Diagnosis not present

## 2024-05-14 LAB — CBC
HCT: 39.8 % (ref 39.0–52.0)
Hemoglobin: 12.3 g/dL — ABNORMAL LOW (ref 13.0–17.0)
MCH: 28.5 pg (ref 26.0–34.0)
MCHC: 30.9 g/dL (ref 30.0–36.0)
MCV: 92.3 fL (ref 80.0–100.0)
Platelets: 371 K/uL (ref 150–400)
RBC: 4.31 MIL/uL (ref 4.22–5.81)
RDW: 18.2 % — ABNORMAL HIGH (ref 11.5–15.5)
WBC: 10.2 K/uL (ref 4.0–10.5)
nRBC: 0 % (ref 0.0–0.2)

## 2024-05-14 LAB — COMPREHENSIVE METABOLIC PANEL WITH GFR
ALT: 47 U/L — ABNORMAL HIGH (ref 0–44)
AST: 38 U/L (ref 15–41)
Albumin: 3 g/dL — ABNORMAL LOW (ref 3.5–5.0)
Alkaline Phosphatase: 81 U/L (ref 38–126)
Anion gap: 14 (ref 5–15)
BUN: 17 mg/dL (ref 8–23)
CO2: 23 mmol/L (ref 22–32)
Calcium: 9.3 mg/dL (ref 8.9–10.3)
Chloride: 105 mmol/L (ref 98–111)
Creatinine, Ser: 1.69 mg/dL — ABNORMAL HIGH (ref 0.61–1.24)
GFR, Estimated: 43 mL/min — ABNORMAL LOW (ref >60)
Glucose, Bld: 138 mg/dL — ABNORMAL HIGH (ref 70–99)
Potassium: 3.9 mmol/L (ref 3.5–5.1)
Sodium: 142 mmol/L (ref 135–145)
Total Bilirubin: 0.8 mg/dL (ref 0.0–1.2)
Total Protein: 6.6 g/dL (ref 6.5–8.1)

## 2024-05-14 LAB — PROTIME-INR
INR: 3.1 — ABNORMAL HIGH (ref 0.8–1.2)
Prothrombin Time: 33.5 s — ABNORMAL HIGH (ref 11.4–15.2)

## 2024-05-14 MED ORDER — AMIODARONE HCL 200 MG PO TABS
200.0000 mg | ORAL_TABLET | Freq: Two times a day (BID) | ORAL | Status: DC
Start: 2024-05-14 — End: 2024-05-18

## 2024-05-14 MED ORDER — TORSEMIDE 20 MG PO TABS
20.0000 mg | ORAL_TABLET | Freq: Every day | ORAL | Status: DC
  Administered 2024-05-14: 20 mg via ORAL
  Filled 2024-05-14: qty 1

## 2024-05-14 MED ORDER — TRAMADOL HCL 50 MG PO TABS: 50.0000 mg | ORAL_TABLET | Freq: Four times a day (QID) | ORAL | 0 refills | Status: DC | PRN

## 2024-05-14 MED ORDER — SPIRONOLACTONE 25 MG PO TABS: 25.0000 mg | ORAL_TABLET | Freq: Every day | ORAL | 2 refills | Status: DC

## 2024-05-14 NOTE — Progress Notes (Signed)
 Advanced Heart Failure Rounding Note  Cardiologist: [name], MD  HF Cardiologist: Dr. Rolan  Chief Complaint: CHF  Subjective:   S/P Atrial Lead  Feels ok. Denies SOB   Objective:   Weight Range: 99.8 kg Body mass index is 35.51 kg/m.   Vital Signs:   Temp:  [98.4 F (36.9 C)-98.7 F (37.1 C)] 98.4 F (36.9 C) (11/12 0419) Pulse Rate:  [55-100] 55 (11/12 0419) Resp:  [18-20] 18 (11/12 0741) BP: (99-146)/(73-115) 106/81 (11/12 0419) SpO2:  [87 %-100 %] 92 % (11/12 0419) Weight:  [99.8 kg-100.5 kg] 99.8 kg (11/12 0415) Last BM Date : 05/12/24  Weight change: Filed Weights   05/13/24 0515 05/13/24 1100 05/14/24 0415  Weight: 100.7 kg 100.5 kg 99.8 kg    Intake/Output:   Intake/Output Summary (Last 24 hours) at 05/14/2024 0831 Last data filed at 05/13/2024 1700 Gross per 24 hour  Intake 480 ml  Output 750 ml  Net -270 ml      Physical Exam    General:   No resp difficulty Neck: JVP 7-8  Cor: Regular rate & rhythm.  Lungs: clear Abdomen: soft, nontender, nondistended.  Extremities: no  edema. LUE sling Neuro: alert & oriented x3   Telemetry  Paced 70   Labs    CBC Recent Labs    05/13/24 0258 05/14/24 0806  WBC 9.6 10.2  HGB 12.0* 12.3*  HCT 38.4* 39.8  MCV 91.9 92.3  PLT 324 371   Basic Metabolic Panel Recent Labs    88/89/74 0252 05/13/24 0258  NA 141 145  K 3.4* 3.3*  CL 107 108  CO2 21* 25  GLUCOSE 111* 99  BUN 21 17  CREATININE 1.86* 1.67*  CALCIUM  9.1 8.8*  MG  --  2.0   Liver Function Tests Recent Labs    05/13/24 0258  AST 37  ALT 66*  ALKPHOS 76  BILITOT 0.5  PROT 6.1*  ALBUMIN 2.8*   No results for input(s): LIPASE, AMYLASE in the last 72 hours. Cardiac Enzymes No results for input(s): CKTOTAL, CKMB, CKMBINDEX, TROPONINI in the last 72 hours.  BNP: BNP (last 3 results) Recent Labs    03/19/24 1600 05/05/24 0114 05/09/24 1945  BNP 773.9* 470.9* 749.4*    ProBNP (last 3  results) No results for input(s): PROBNP in the last 8760 hours.   D-Dimer No results for input(s): DDIMER in the last 72 hours. Hemoglobin A1C No results for input(s): HGBA1C in the last 72 hours. Fasting Lipid Panel No results for input(s): CHOL, HDL, LDLCALC, TRIG, CHOLHDL, LDLDIRECT in the last 72 hours. Thyroid  Function Tests No results for input(s): TSH, T4TOTAL, T3FREE, THYROIDAB in the last 72 hours.  Invalid input(s): FREET3  Other results:   Imaging    DG Chest 2 View Result Date: 05/14/2024 EXAM: 2 VIEW(S) XRAY OF THE CHEST 05/14/2024 04:05:00 AM COMPARISON: Portable chest 05/09/2024. CLINICAL HISTORY: ICD (implantable cardioverter-defibrillator) in place. FINDINGS: LINES, TUBES AND DEVICES: Stable left chest 2 lead pacing system/AICD. There are multiple overlying monitor wires. LUNGS AND PLEURA: There is increased haziness in the left lower lung field concerning for pneumonia or aspiration. The remaining lungs are clear. No pulmonary edema. No significant pleural effusion. No pneumothorax. HEART AND MEDIASTINUM: The heart is enlarged. Aortic tortuosity and patchy calcification. Stable mediastinum. Central vessel markings are normal caliber. There is no overt edema. BONES AND SOFT TISSUES: Sternotomy plates and sutures are in place, remaining in the midline. AVR is better seen on the lateral  view. No acute osseous abnormality. IMPRESSION: 1. Left lower lung field airspace disease suspicious for pneumonia or aspiration. 2. Cardiomegaly without overt edema. Electronically signed by: Francis Quam MD 05/14/2024 05:34 AM EST RP Workstation: [id number]   EP PPM/ICD IMPLANT Result Date: 05/13/2024 Successful insertion of an atrial pacing lead in a patient with restoration of sinus rhythm with underlying complete heart block resulting in the reestablishment of AV synchrony. Danelle Birmingham, MD   PERIPHERAL VASCULAR CATHETERIZATION Result Date:  05/13/2024 Successful insertion of an atrial pacing lead in a patient with restoration of sinus rhythm with underlying complete heart block resulting in the reestablishment of AV synchrony. Gregg Taylor, MD     Medications:     Scheduled Medications:  acetaminophen   650 mg Oral TID   allopurinol   100 mg Oral Daily   amiodarone   200 mg Oral BID   baclofen   10 mg Oral BID WC   budesonide-glycopyrrolate-formoterol   2 puff Inhalation BID   buPROPion   300 mg Oral Daily   dapagliflozin  propanediol  10 mg Oral Daily   furosemide   80 mg Intravenous BID   melatonin  5 mg Oral QHS   mexiletine  150 mg Oral TID   midodrine   2.5 mg Oral TID WC   nortriptyline   10 mg Oral QHS   pantoprazole   40 mg Oral BID   rosuvastatin   40 mg Oral Daily   spironolactone   25 mg Oral Daily    Infusions:   PRN Medications: acetaminophen , albuterol , OLANZapine zydis, traMADol, traZODone    Assessment/Plan   1. Acute on chronic systolic CHF with prominent RV dysfunction:  Nonischemic cardiomyopathy.  St Jude CRT-D device. Nonobstructive CAD on caths in 7/20 and 12/24. Cardiac MRI in 12/24 showed LV EF 20%, RV EF 41%, mid-myocardial basal septal LGE (nonspecific non-coronary pattern).  Echo in 3/25 showed EF 25-30% with severe LV dilation, normal RV size with mild systolic dysfunction, mechanical aortic valve with mean gradient 13 mmHg. RHC in 9/25 showed predominantly RV failure with mildly decreased CI by Fick (2.2) and preserved CI by thermo (2.38) with PAPi 1.5. Torsemide  restarted. Echo this admission with EF < 20%, moderate RV dysfunction with mild RV enlargement, mechanical aortic valve ok, IVC dilated.  - Suspect episode of delirium overnight. With changes in mental status and advanced heart failure, will check lactic acid. Although does not appear cold on exam. - Volume status improved. Stop IV lasix . Restart torsemide  20 mg daily  --Stop midodrine  -Continue spironolactone  to 25 mg daily - Continue  Farxiga  10 mg daily - Probably would not be a candidate for advanced therapies with renal dysfunction and deconditioning/frailty; ascending aortic aneurysm is also a concern. 2. Atrial fibrillation/flutter: DCCV in 2/25.  He looks like he is possibly in AF now. Had recent inappropriate ICD shock in setting of SVT.  - Now with Atrial lead.  - He is anticoagulated with Warfarin. 3. OSA: Moderate OSA, waiting for CPAP.  4. CAD: Nonobstructive on 7/20 and 12/24 caths.  I would aim for LDL at least < 70.  - Continue Crestor  40 mg daily.    5. Mechanical aortic valve: Stable function on echo this admission.  Given history of CVA, should have Lovenox  bridging when he needs to hold warfarin.  Goal INR 2.5-3.5 with additional history of atrial fibrillation.  INR stable.  6. Smoking: We discussed cessation.   7. Ascending aortic aneurysm: Suspect this is related to his bicuspid aortic valve.  Last CT chest w/o contrast in 3/25 showed aneurysm  5.6 cm.  He would be high risk for repair.  This will also complicate consideration of LVAD.  8. Obesity: he tolerate GLP-1 agonist poorly with AKI/dehydration.  9. VT: He has St Jude CRT-D.  - On amiodarone  per EP, LFTs have been elevated (?due to hepatic congestion with RV failure).  - Continue mexiletine 150 mg tid.  10. CKD stage 3: Creatinine baseline 1.6-1.9. - Scr 1.67 today - Follow with diuresis and reintroducing GDMT 11. AMS: ? Delirium overnight. He was started on zyprexa - Had evidence of cirrhosis on US  abdomen.  - UA 4 days ago w/o concern for UTI - Resolved.   Ok d/c from HF standpoint. HF Team will sign off.  Torsemide  20 mg daily  Kdur 10 meq daily  Spiro 25 mg daily  Farxiga  10 mg daily  Mexitil  150 mg three time a day  Amio 200 mg twice a day   HF will set up follow up.      Length of Stay: 1  Merrianne Mccumbers, NP  05/14/2024, 8:31 AM  Advanced Heart Failure Team Pager [phone] (M-F; 7a - 5p)  Please contact [facility] for  night-coverage after hours (5p -7a ) and weekends on [email or web address]

## 2024-05-14 NOTE — Progress Notes (Addendum)
 Patient Name: Roberto [name] Date of Encounter: 05/14/2024  Primary Cardiologist: [name], MD Electrophysiologist: Danelle Birmingham, MD  Interval Summary   The patient is doing well today.  Reports he understands his post device care as it is similar from last time.    At this time, the patient denies chest pain, shortness of breath, or any new concerns.  Vital Signs    Vitals:   05/14/24 0415 05/14/24 0419 05/14/24 0741 05/14/24 0745  BP:  106/81    Pulse:  (!) 55    Resp:  19 18   Temp:  98.4 F (36.9 C)    TempSrc:  Oral  Oral  SpO2:  92%    Weight: 99.8 kg     Height:        Intake/Output Summary (Last 24 hours) at 05/14/2024 0851 Last data filed at 05/13/2024 1700 Gross per 24 hour  Intake 480 ml  Output 750 ml  Net -270 ml   Filed Weights   05/13/24 0515 05/13/24 1100 05/14/24 0415  Weight: 100.7 kg 100.5 kg 99.8 kg    Physical Exam    GEN- NAD, Alert and oriented  Lungs- Clear to ausculation bilaterally, normal work of breathing Cardiac- Regular rate and rhythm, no murmurs, rubs or gallops GI- soft, NT, ND, + BS Extremities- no clubbing or cyanosis. No edema  Telemetry    VP60-80's (personally reviewed)  Hospital Course    Roberto [name] is a 68 y.o. male PMH of permanent AF, end-stage HF s/p CRT-D admitted for syncopal event.  Device interrogation showed multiple ATP and 1 shock. Initially had what device classified as SVT but at some point classification changed and he received ATP therapy for this and after multiple ATPs he accelerated into ventricular tachycardia and ultimately received a 36 J shock. He reports he was not aware of the shock. When comparing the electrograms available to other SVT episodes, it appears that he received inappropriate ATP for SVT. Unfortunately, patient does not have an atrial lead which would allow for discrimination.  EKG shows AV dyssynchrony.  He underwent implant of atrial lead on 05/13/24 per Dr. Birmingham.    Assessment & Plan    ICD Shock  VT -amiodarone  200 mg BID  -mexiletine 150 mg BID  -follow up LFT's as outpatient    Persistent AF / AFL  AV Dyssynchrony on EKG  Appears to be in SR by EKG with AV dyssynchrony  -amiodarone  as above  -monitor burden on device    Chronic Systolic HF  -follows with AHF team    Mechanical AVR  -HOLD coumadin  11/12 and 11/13.  Resume on 11/14 as previously instructed. He should have follow up with his PCP next week to have an INR check. He states his PCP monitors his INR / coumadin .  Goal for low limit therapeutic over next week to limit risk of bleeding in pocket.   Message sent to PCP regarding INR, coumadin  plan.     For questions or updates, please contact [facility] Please consult [email or web address] for contact info under     Signed, Daphne Barrack, NP-C, AGACNP-BC [facility] - Electrophysiology  05/14/2024, 8:51 AM   EP Attending  Patient seen and examined. Agree with above. He looks and feels better today and on exam has a RRR and lungs are clear. Incision without hematoma. Tele with P synch biv pacing. CXR shows stable lead position. ICD interrogation under my direction shows normal biv ICD function.  A/P VT - he  will continue his amiodarone . CHB - this is a new problem. AV synchrony has been restored. Insertion of an atrial pacing lead - device interrogation demonstrates normal Biv ICD function. Persistent afib - he is maintaining NSR. He will continue amio and restart his blood thinners in 2 days. INR was 3.1 today.  Danelle Deidra Spease,MD

## 2024-05-14 NOTE — Progress Notes (Addendum)
 PHARMACY - ANTICOAGULATION CONSULT NOTE  Pharmacy Consult for warfarin Indication: mechanical aortic valve, afib  Allergies  Allergen Reactions   Latex Hives   Nicotine  Hives and Rash    Patches caused localized rash and hives     Patient Measurements: Height: 5' 6 (167.6 cm) Weight: 99.8 kg (220 lb) IBW/kg (Calculated) : 63.8 HEPARIN  DW (KG): 86  Vital Signs: Temp: 98.4 F (36.9 C) (11/12 0419) Temp Source: Oral (11/12 0419) BP: 106/81 (11/12 0419) Pulse Rate: 55 (11/12 0419)  Labs: Recent Labs    05/11/24 0758 05/12/24 0252 05/13/24 0258  HGB 12.1* 11.8* 12.0*  HCT 38.4* 38.1* 38.4*  PLT 318 340 324  LABPROT 30.2* 30.5* 30.3*  INR 2.7* 2.8* 2.7*  CREATININE 1.79* 1.86* 1.67*    Estimated Creatinine Clearance: 46.2 mL/min (A) (by C-G formula based on SCr of 1.67 mg/dL (H)).   Medical History: Past Medical History:  Diagnosis Date   Aneurysm    Bicuspid aortic valve    a. s/p #27 Carbomed[employer] mechanical valve on 03/25/2010; b. on Coumadin ; c. TTE 12/17: EF 40-45%, moderately dilated LV with moderate LVH, AVR well-seated with 14 mmHg gradient, peak AV velocity 2.5 m/s, mild mitral valve thickening with mild MR, mildly dilated RV with mildly reduced contraction   Cellulitis    CHF (congestive heart failure) (HCC)    Chronic kidney disease    Chronic systolic CHF (congestive heart failure) (HCC)    a. R/LHC 03/2010 showed no significant CAD, LVEDP 31 mmHg, mean AoV gradient 34 mmHg at rest and 47 mmHg with dobutamine 20 mcg/kg/min, AVA 1.0 cm^2, RA 31, RV 68/25, PA 68/47, PCWP 38. PA sat 65%. CO 6.2 L/min (Fick) and 5.3 L/min (thermodilution)   Clotting disorder    COPD (chronic obstructive pulmonary disease) (HCC)    H/O mechanical aortic valve replacement 03/25/2010   a. 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    Stage 3 chronic kidney disease (HCC)    Stroke (HCC)    TIA (transient ischemic attack) 05/2014    Medications:  -Warfarin/lovenox  bridge: Per PCP note on 04/23/24, patient has recently been struggling to eat without vomiting and, therefore, struggling to maintain goal INR levels so the patient was switched from warfarin to enoxaparin  (03/25/24). Plan was to transition back to warfarin once patient could tolerate oral intake   -Patient was cycling through warfarin and dosing as 4mg  PO on one day, 4mg  PO the next, and then 7.5mg  PO and continuing cycle  Assessment: 39 yoM presented s/p fall and txr from [facility]. PMH includes mAVR (warfarin), non-obstructive CAD, thoracic aortic aneurysm, CVA, atrial fibrillation, HFrEF (LVEF 20%), Vtach s/p CRT-D, HLD. Pharmacy consulted to dose warfarin while patient in hospital.  Home amiodarone  200mg  BID was restarted per EP which was being held in the setting of elevated LFTs since they are now downtrending > can increase warfarin/INR sensitivity.   INR is 3.1. Hgb 12.3, plt 371. No s/sx of bleeding. Discussed with EP talk - would like to hold warfarin dose today given upward trend (last dose was on 11/10).   Goal of Therapy:  INR 2.5-3.5 Monitor platelets by anticoagulation protocol: Yes   Plan:  Discussed plan with EP - hold coumadin  for 2 days and then restart on Friday with INR next week - can use PTA regimen at discharge  Continue daily INR.  Thank you for allowing pharmacy to participate in this patient's care,  Suzen Sour, PharmD, BCCCP Clinical Pharmacist  Phone: [phone] 05/14/2024 8:31 AM  Please check AMION for all [facility] Pharmacy phone numbers After 10:00 PM, call Main Pharmacy [phone]

## 2024-05-14 NOTE — TOC Initial Note (Addendum)
 Transition of Care [facility]) - Initial/Assessment Note    Patient Details  Name: Roberto [name] MRN: [id number] Date of Birth: [birth year]  Transition [facility] Care [facility]) CM/SW Contact:    Roberto [name] Phone Number: [phone] 05/14/2024, 10:21 AM  Clinical Narrative:     HF CSW met with the patient at bedside. Patient stated that he lives with wife. Patient stated that he has no history of HH services. Patient stated that he uses a walker. Patient stated that he has a scale at home. Patient stated that he has a PCP. CSW explained that a hospital follow up is typically scheduled closer towards dc. Patient is agreeable. Patients wife will provide transportation at dc.   Hospital follow up scheduled for Tuesday, May 12, 2024 at 10:00 AM.   HF CSW/CM will continue to follow and monitor for dc readiness.                     Patient Goals and CMS Choice            Expected Discharge Plan and Services                                              Prior Living Arrangements/Services                       Activities of Daily Living   ADL Screening (condition at time of admission) Independently performs ADLs?: Yes (appropriate for developmental age) Is the patient deaf or have difficulty hearing?: Yes Does the patient have difficulty seeing, even when wearing glasses/contacts?: Yes Does the patient have difficulty concentrating, remembering, or making decisions?: Yes  Permission Sought/Granted                  Emotional Assessment              Admission diagnosis:  Syncope and collapse [R55] Syncope [R55] Patient Active Problem List   Diagnosis Date Noted   Syncope 05/13/2024   ICD (implantable cardioverter-defibrillator) discharge 05/11/2024   Syncope and collapse 05/10/2024   OSA (obstructive sleep apnea) 05/10/2024   Altered mental status 05/07/2024   Lactic acidosis 05/07/2024   Acute pulmonary edema (HCC) 05/05/2024   Acute  respiratory failure with hypoxia (HCC) 05/05/2024   Elevated troponin 05/05/2024   Transaminitis 05/05/2024   Prolonged QT interval 05/05/2024   Normocytic anemia 05/01/2024   Gout 05/01/2024   Paroxysmal atrial fibrillation (HCC) 05/01/2024   Type 2 diabetes mellitus with chronic kidney disease, without long-term current use of insulin  (HCC) 05/01/2024   Dyslipidemia 05/01/2024   Essential hypertension 05/01/2024   Hypokalemia 05/01/2024   Encephalopathy 04/30/2024   Esophageal dysmotility 03/28/2024   Indigestion 03/24/2024   Unintentional weight loss 03/07/2024   Presence of automatic (implantable) cardiac defibrillator 03/07/2024   Presence of heart assist device (HCC) 02/15/2024   Polyp of colon 12/13/2023   PTSD (post-traumatic stress disorder) 11/15/2023   DM type 2 with diabetic peripheral neuropathy (HCC) 10/23/2023   Essential tremor 10/23/2023   Pacemaker 09/11/2023   Persistent atrial fibrillation (HCC) 07/12/2023   Acute on chronic HFrEF (heart failure with reduced ejection fraction) (HCC) 06/22/2023   VT (ventricular tachycardia) (HCC) 06/18/2023   Hypotension 06/18/2023   Atrial fibrillation (HCC) 06/17/2023   Depression 06/17/2023   Tremor of left hand 05/29/2022   Aneurysm of  ascending aorta without rupture 02/17/2022   Anticoagulated on Coumadin  07/29/2021   Chronic kidney disease, stage 3a (HCC) 12/09/2020   Hyperlipidemia associated with type 2 diabetes mellitus (HCC) 12/09/2020   Coronary artery disease involving native coronary artery of native heart without angina pectoris 09/10/2019   Osteoarthritis of spine with radiculopathy, lumbar region 11/21/2018   History of aortic stenosis 06/05/2018   Chronic gout without tophus 04/03/2018   Valvular heart disease 01/17/2018   Morbid obesity (HCC) 01/17/2018   Chronic systolic heart failure (HCC) 09/27/2016   Renal infarct 05/01/2016   Type 2 diabetes mellitus with cardiac complication (HCC) 04/19/2016    Chronic bilateral low back pain without sciatica 04/19/2016   Thoracic aortic aneurysm without rupture 03/16/2016   Aortic atherosclerosis 03/16/2016   Nicotine  dependence, cigarettes, uncomplicated 03/14/2016   NICM (nonischemic cardiomyopathy) (HCC) 03/02/2016   COPD (chronic obstructive pulmonary disease) (HCC) 09/09/2015   H/O mechanical aortic valve replacement 02/09/2015   Hypertension associated with diabetes (HCC) 02/09/2015   PCP:  [name], DO Pharmacy:   [facility] [address] ([address]), [place] - [address] [place]) [place] [address] Phone: [phone] Fax: [phone]  [name] [address] [place] [address] Phone: [phone] Fax: [phone]     Social Drivers of Health (SDOH) Social History: SDOH Screenings   Food Insecurity: Food Insecurity Present (05/10/2024)  Housing: Low Risk  (05/10/2024)  Transportation Needs: No Transportation Needs (05/10/2024)  Recent Concern: Transportation Needs - Unmet Transportation Needs (05/01/2024)  Utilities: Not At Risk (05/10/2024)  Alcohol Screen: Low Risk  (09/04/2023)  Depression (PHQ2-9): High Risk (03/28/2024)  Financial Resource Strain: High Risk (04/21/2024)  Physical Activity: Inactive (04/21/2024)  Social Connections: Socially Isolated (05/10/2024)  Stress: Stress Concern Present (04/21/2024)  Tobacco Use: High Risk (05/09/2024)  Health Literacy: Adequate Health Literacy (09/04/2023)   SDOH Interventions: Food Insecurity Interventions: Inpatient TOC, Community Resources Provided Social Connections Interventions: Inpatient TOC, Community Resources Provided   Readmission Risk Interventions    08/29/2023    3:19 PM 06/18/2023   10:33 AM  Readmission Risk Prevention Plan  Transportation Screening Complete Complete  PCP or Specialist Appt within 3-5 Days  Complete  HRI or Home Care Consult Complete Complete  Social Work  Consult for Recovery Care Planning/Counseling Complete Patient refused  Palliative Care Screening Not Applicable Not Applicable  Medication Review Oceanographer) Referral to Pharmacy Complete

## 2024-05-14 NOTE — Progress Notes (Signed)
 Occupational Therapy Treatment Patient Details Name: Roberto [name] MRN: [id number] DOB: [birth year] Today's Date: 05/14/2024   History of present illness 67 y.o. male adm 05/09/24 due to syncope from ICD shock (discharged from [facility] same day), acute on chronic CHF. PMHx: T2DM, CKD, liver disease, HTN, HLD, CVA, HFrEF, ICD, hypotension on midodrine , COPD, obesity, essential tremor   OT comments  Pt making good progress with functional goals, eager to d/c home today      If plan is discharge home, recommend the following:  A little help with walking and/or transfers;A little help with bathing/dressing/bathroom;Help with stairs or ramp for entrance   Equipment Recommendations  None recommended by OT    Recommendations for Other Services      Precautions / Restrictions Precautions Precautions: Fall Recall of Precautions/Restrictions: Intact Restrictions Weight Bearing Restrictions Per Provider Order: No       Mobility Bed Mobility Overal bed mobility: Modified Independent                  Transfers Overall transfer level: Needs assistance Equipment used: Rolling walker (2 wheels) Transfers: Sit to/from Stand Sit to Stand: Supervision           General transfer comment: cues for hand placement     Balance Overall balance assessment: Needs assistance Sitting-balance support: No upper extremity supported, Feet supported Sitting balance-Leahy Scale: Good     Standing balance support: Bilateral upper extremity supported, During functional activity, Reliant on assistive device for balance Standing balance-Leahy Scale: Poor                             ADL either performed or assessed with clinical judgement   ADL Overall ADL's : Needs assistance/impaired     Grooming: Wash/dry hands;Wash/dry face;Oral care;Contact guard assist;Standing       Lower Body Bathing: Minimal assistance       Lower Body Dressing: Minimal assistance   Toilet  Transfer: Contact guard assist;Supervision/safety;Ambulation;Grab bars   Toileting- Clothing Manipulation and Hygiene: Contact guard assist   Tub/ Shower Transfer: Supervision/safety;Ambulation;Grab bars   Functional mobility during ADLs: Contact guard assist;Supervision/safety;Cueing for safety General ADL Comments: CGA for initial stand from EOB and commode    Extremity/Trunk Assessment Upper Extremity Assessment Upper Extremity Assessment: Generalized weakness;LUE deficits/detail LUE Deficits / Details: sling   Lower Extremity Assessment Lower Extremity Assessment: Defer to PT evaluation        Vision Ability to See in Adequate Light: 0 Adequate Patient Visual Report: No change from baseline     Perception     Praxis     Communication Communication Communication: No apparent difficulties Factors Affecting Communication: Hearing impaired   Cognition Arousal: Alert Behavior During Therapy: WFL for tasks assessed/performed                                 Following commands: Intact        Cueing   Cueing Techniques: Verbal cues  Exercises      Shoulder Instructions       General Comments      Pertinent Vitals/ Pain       Pain Assessment Pain Assessment: No/denies pain Pain Score: 0-No pain Pain Intervention(s): Monitored during session, Repositioned  Home Living  Prior Functioning/Environment              Frequency  Min 1X/week        Progress Toward Goals  OT Goals(current goals can now be found in the care plan section)  Progress towards OT goals: Progressing toward goals     Plan      Co-evaluation                 AM-PAC OT 6 Clicks Daily Activity     Outcome Measure   Help from another person eating meals?: None Help from another person taking care of personal grooming?: A Little Help from another person toileting, which includes using toliet,  bedpan, or urinal?: A Little Help from another person bathing (including washing, rinsing, drying)?: A Little Help from another person to put on and taking off regular upper body clothing?: None Help from another person to put on and taking off regular lower body clothing?: A Little 6 Click Score: 20    End of Session Equipment Utilized During Treatment: Rolling walker (2 wheels);Gait belt  OT Visit Diagnosis: Other abnormalities of gait and mobility (R26.89);Repeated falls (R29.6);Other symptoms and signs involving cognitive function   Activity Tolerance Patient tolerated treatment well   Patient Left with call bell/phone within reach;in bed;with bed alarm set   Nurse Communication Mobility status        Time: 8976-8958 OT Time Calculation (min): 18 min  Charges: OT General Charges $OT Visit: 1 Visit OT Treatments $Self Care/Home Management : 8-22 mins    Jacques Karna Loose 05/14/2024, 11:52 AM

## 2024-05-14 NOTE — TOC Transition Note (Signed)
 Transition of Care [facility]) - Discharge Note   Patient Details  Name: Roberto [name] MRN: [id number] Date of Birth: 27-Jul-1955  [facility] CM/SW Contact:  Waddell Barnie Rama, RN Phone Number: 05/14/2024, 11:26 AM   Clinical Narrative:    For dc today, per pt eval rec outpatient PT,  patient states his wife will transport him home at dc.           Patient Goals and CMS Choice            Discharge Placement                       Discharge Plan and Services Additional resources added to the After Visit Summary for                                       Social Drivers of Health (SDOH) Interventions SDOH Screenings   Food Insecurity: Food Insecurity Present (05/10/2024)  Housing: Low Risk  (05/10/2024)  Transportation Needs: No Transportation Needs (05/10/2024)  Recent Concern: Transportation Needs - Unmet Transportation Needs (05/01/2024)  Utilities: Not At Risk (05/10/2024)  Alcohol Screen: Low Risk  (09/04/2023)  Depression (PHQ2-9): High Risk (03/28/2024)  Financial Resource Strain: High Risk (04/21/2024)  Physical Activity: Inactive (04/21/2024)  Social Connections: Socially Isolated (05/10/2024)  Stress: Stress Concern Present (04/21/2024)  Tobacco Use: High Risk (05/09/2024)  Health Literacy: Adequate Health Literacy (09/04/2023)     Readmission Risk Interventions    08/29/2023    3:19 PM 06/18/2023   10:33 AM  Readmission Risk Prevention Plan  Transportation Screening Complete Complete  PCP or Specialist Appt within 3-5 Days  Complete  HRI or Home Care Consult Complete Complete  Social Work Consult for Recovery Care Planning/Counseling Complete Patient refused  Palliative Care Screening Not Applicable Not Applicable  Medication Review Oceanographer) Referral to Pharmacy Complete

## 2024-05-14 NOTE — Discharge Summary (Signed)
 Physician Discharge Summary  Roberto [name] FMW:[id number] DOB: [birth year] DOA: 05/09/2024  PCP: [name], DO  Admit date: 05/09/2024 Discharge date: 05/14/2024  Admitted From: Home Disposition: Home  Recommendations for Outpatient Follow-up:  Hold Coumadin  for now, resume on 11/14, follow-up with PCP early next week for INR check. Follow up in ED if symptoms worsen or new appear  Discharge Condition: Stable CODE STATUS: Full Diet recommendation: Heart healthy  Brief/Interim Summary: Roberto [name] is an 68 y.o. male past medical history significant for severe biventricular failure with EF less than 20%, aortic valve replacement on Coumadin , nonobstructive CAD, thoracic aortic aneurysm (followed by CT surgery as an outpatient), history of CVA TIA, chronic kidney disease stage III yea, he also has a history of renal infarct and HFrEF/biventricular failure complicated by VT status post CRT-D and severe right-sided heart failure, persistent atrial fibrillation who was admitted for syncopal evaluation.   Device interrogation showed inappropriate ICD shocks.  EKG shows AV dyssynchrony.  He underwent implant of atrial lead on 05/13/24 per Dr. Waddell.  Treated with Lovenox  perioperatively.  INR is 3.1 on the day of discharge.  EP is recommending to hold warfarin for 2 more days and restart at home dose on 05/16/2024.  He need to follow-up with his PCP early next week for INR checks.  Patient discharged in stable condition. Outpatient PT referral placed.  Severe biventricular failure-CRT-D in place.  Continue mexiletine, Aldactone , tremor, Farxiga , amiodarone  Chronic A-fib Mechanical aortic valve-Target INR 2.5-3.5.  Restart warfarin 11/14. Ascending aortic aneurysm, chronic CKD, stable COPD, continue inhalers OSA, continue CPAP GERD, continue PPI Chronic back pain Gout, continue allopurinol   Discharge Diagnoses:  Principal Problem:   Syncope and collapse Active Problems:   VT  (ventricular tachycardia) (HCC)   Atrial fibrillation (HCC)   COPD (chronic obstructive pulmonary disease) (HCC)   Chronic systolic heart failure (HCC)   Acute on chronic HFrEF (heart failure with reduced ejection fraction) (HCC)   OSA (obstructive sleep apnea)   ICD (implantable cardioverter-defibrillator) discharge   Syncope    Discharge Instructions  Discharge Instructions     Ambulatory referral to Physical Therapy   Complete by: As directed    [facility] in [place] for Physical therapy eval and treat   Diet - low sodium heart healthy   Complete by: As directed    Discharge instructions   Complete by: As directed    1.  Follow medication changes as prescribed.  2.  Continue to hold off on warfarin for now.  Restart warfarin at home dose on Friday 11/14.  Follow-up with PCP next week, obtain INR early next week  2.  Follow-up with cardiology. 3.  Return to the ER if recurrence of symptoms.   Increase activity slowly   Complete by: As directed    No wound care   Complete by: As directed       Allergies as of 05/14/2024       Reactions   Latex Hives   Nicotine  Hives, Rash   Patches caused localized rash and hives         Medication List     STOP taking these medications    enoxaparin  120 MG/0.8ML injection Commonly known as: LOVENOX    fenofibrate  48 MG tablet Commonly known as: TRICOR    metoprolol  succinate 25 MG 24 hr tablet Commonly known as: TOPROL -XL   midodrine  10 MG tablet Commonly known as: PROAMATINE        TAKE these medications    allopurinol  100 MG  tablet Commonly known as: ZYLOPRIM  Take 1 tablet (100 mg total) by mouth daily.   amiodarone  200 MG tablet Commonly known as: PACERONE  Take 1 tablet (200 mg total) by mouth 2 (two) times daily.   baclofen  10 MG tablet Commonly known as: LIORESAL  Take 1 tablet (10 mg total) by mouth 2 (two) times daily. Take before food twice a day, stop if not helping   buPROPion  300 MG 24 hr  tablet Commonly known as: WELLBUTRIN  XL Take 300 mg by mouth daily.   dapagliflozin  propanediol 10 MG Tabs tablet Commonly known as: Farxiga  Take 1 tablet (10 mg total) by mouth daily before breakfast.   feeding supplement Liqd Take 237 mLs by mouth 2 (two) times daily between meals.   mexiletine 150 MG capsule Commonly known as: MEXITIL  Take 150 mg by mouth 3 (three) times daily.   nortriptyline  10 MG capsule Commonly known as: PAMELOR  Take 1 capsule by mouth at bedtime   OMEGA 3 PO Take 1,500 mg by mouth 2 (two) times daily.   ondansetron  4 MG disintegrating tablet Commonly known as: ZOFRAN -ODT Take 1 tablet (4 mg total) by mouth every 8 (eight) hours as needed for nausea or vomiting (before eating).   pantoprazole  40 MG tablet Commonly known as: PROTONIX  Take 1 tablet (40 mg total) by mouth 2 (two) times daily. Then go back to once a day What changed: additional instructions   potassium chloride  10 MEQ CR capsule Commonly known as: MICRO-K  Take 1 capsule (10 mEq total) by mouth daily.   rosuvastatin  40 MG tablet Commonly known as: CRESTOR  Take 1 tablet (40 mg total) by mouth daily.   Spacer/Aero-Holding [employer] Use as directed Dx: COPD, J44.9   spironolactone  25 MG tablet Commonly known as: ALDACTONE  Take 1 tablet (25 mg total) by mouth daily. What changed: how much to take   torsemide  20 MG tablet Commonly known as: DEMADEX  Take 1 tablet (20 mg total) by mouth daily.   traMADol 50 MG tablet Commonly known as: ULTRAM Take 1 tablet (50 mg total) by mouth every 6 (six) hours as needed for moderate pain (pain score 4-6).   traZODone  50 MG tablet Commonly known as: DESYREL  Take 0.5 tablets (25 mg total) by mouth at bedtime as needed for sleep.   Trelegy Ellipta  200-62.5-25 MCG/ACT Aepb Generic drug: Fluticasone -Umeclidin-Vilant Inhale 1 puff into the lungs daily in the afternoon.   Ventolin  HFA 108 (90 Base) MCG/ACT inhaler Generic drug:  albuterol  INHALE 2 PUFFS BY MOUTH EVERY 6 HOURS AS NEEDED FOR WHEEZING OR SHORTNESS OF BREATH   albuterol  (2.5 MG/3ML) 0.083% nebulizer solution Commonly known as: PROVENTIL  USE 1 VIAL IN NEBULIZER EVERY 6 HOURS AS NEEDED FOR WHEEZING FOR SHORTNESS OF BREATH   warfarin 3 MG tablet Commonly known as: COUMADIN  Take as directed. If you are unsure how to take this medication, talk to your nurse or doctor. Original instructions: Take 3 mg by mouth daily. Notes to patient: Can restart previous home regimen on Fri (05/16/2024) with INR check next week   warfarin 1 MG tablet Commonly known as: COUMADIN  Take as directed. If you are unsure how to take this medication, talk to your nurse or doctor. Original instructions: Take 1 mg by mouth daily. Notes to patient: Can restart previous home regimen on Fri (05/16/2024) with INR check next week        Follow-up Information     [facility]. Go on 05/23/2024.   Specialty: Cardiology Why: [facility]  Follow-Up 05/23/24 @ 9:30 Please bring all medications to follow-up appointment [facility], [address] Second [address] Free Valet Parking at the Advertising Account Planner information: [address] [place]  [address] [phone]        [name], DO. Go in 6 day(s).   Specialty: Family Medicine Why: Hospital follow up scheduled for Tuesday, May 12, 2024 at 10:00 AM.  PLEASE ARRIVE 10-15 minutes early.  PLEASE call to cancel/reschedule if you CANNOT make appointment. Contact information: [address] [place] [address] [phone]         Vicci Duwaine P, DO Follow up.   Specialty: Family Medicine Why: He should have follow up INR with his PCP next week > around 11/19  HOLD coumadin  11/12 and 11/13.  Resume on 11/14 as previously instructed. He should have follow up with his PCP next week to have an INR check. He states his PCP monitors his INR / coumadin .   Goal for low limit therapeutic over next week to limit risk of bleeding in pocket. Contact information: [address] [place] [address] [phone]                Allergies  Allergen Reactions   Latex Hives   Nicotine  Hives and Rash    Patches caused localized rash and hives     Consultations:    Procedures/Studies: DG Chest 2 View Result Date: 05/14/2024 EXAM: 2 VIEW(S) XRAY OF THE CHEST 05/14/2024 04:05:00 AM COMPARISON: Portable chest 05/09/2024. CLINICAL HISTORY: ICD (implantable cardioverter-defibrillator) in place. FINDINGS: LINES, TUBES AND DEVICES: Stable left chest 2 lead pacing system/AICD. There are multiple overlying monitor wires. LUNGS AND PLEURA: There is increased haziness in the left lower lung field concerning for pneumonia or aspiration. The remaining lungs are clear. No pulmonary edema. No significant pleural effusion. No pneumothorax. HEART AND MEDIASTINUM: The heart is enlarged. Aortic tortuosity and patchy calcification. Stable mediastinum. Central vessel markings are normal caliber. There is no overt edema. BONES AND SOFT TISSUES: Sternotomy plates and sutures are in place, remaining in the midline. AVR is better seen on the lateral view. No acute osseous abnormality. IMPRESSION: 1. Left lower lung field airspace disease suspicious for pneumonia or aspiration. 2. Cardiomegaly without overt edema. Electronically signed by: Francis Quam MD 05/14/2024 05:34 AM EST RP Workstation: [id number]   EP PPM/ICD IMPLANT Result Date: 05/13/2024 Successful insertion of an atrial pacing lead in a patient with restoration of sinus rhythm with underlying complete heart block resulting in the reestablishment of AV synchrony. Danelle Birmingham, MD   PERIPHERAL VASCULAR CATHETERIZATION Result Date: 05/13/2024 Successful insertion of an atrial pacing lead in a patient with restoration of sinus rhythm with underlying complete heart block resulting in the reestablishment of AV  synchrony. Danelle Birmingham, MD   EEG adult Result Date: 05/10/2024 Shelton Arlin [name], MD     05/10/2024  6:07 PM Patient Name: Roberto [name] MRN: [id number] Epilepsy Attending: Arlin [name] Shelton Referring Physician/Provider: Alfornia Madison, MD Date: 05/10/2024 Duration: 22.56 mins Patient history: 68yo M with syncope. EEG to evaluate for seizure Level of alertness: Awake AEDs during EEG study: None Technical aspects: This EEG study was done with scalp electrodes positioned according to the 10-20 International system of electrode placement. Electrical activity was reviewed with band pass filter of 1-70Hz , sensitivity of 7 uV/mm, display speed of 71mm/sec with a 60Hz  notched filter applied as appropriate. EEG data were recorded continuously and digitally stored.  Video monitoring was available and reviewed  as appropriate. Description: The posterior dominant rhythm consists of 8-9Hz  activity of moderate voltage (25-35 uV) seen predominantly in posterior head regions, symmetric and reactive to eye opening and eye closing. Drowsiness was characterized by attenuation of the posterior background rhythm. Hyperventilation and photic stimulation were not performed.   IMPRESSION: This study is within normal limits. No seizures or epileptiform discharges were seen throughout the recording. A normal interictal EEG does not exclude the diagnosis of epilepsy. Arlin MALVA Krebs   DG Knee Right Port Result Date: 05/09/2024 EXAM: 1 or 2 VIEW(S) XRAY OF THE KNEE 05/09/2024 08:44:00 PM COMPARISON: 03/26/2024 CLINICAL HISTORY: recent fall FINDINGS: BONES AND JOINTS: No acute fracture. No focal osseous lesion. No joint dislocation. No significant joint effusion. No significant degenerative changes. SOFT TISSUES: Femoropopliteal vascular calcifications noted. IMPRESSION: 1. No acute fracture or dislocation. Electronically signed by: Norman Gatlin MD 05/09/2024 08:49 PM EST RP Workstation: [id number]   DG Chest Portable 1  View Result Date: 05/09/2024 EXAM: 1 VIEW(S) XRAY OF THE CHEST 05/09/2024 08:43:00 PM COMPARISON: 05/05/2024 CLINICAL HISTORY: defib FINDINGS: LINES, TUBES AND DEVICES: Left chest wall cardiac device stable in position. LUNGS AND PLEURA: No focal pulmonary opacity. No pulmonary edema. No pleural effusion. No pneumothorax. HEART AND MEDIASTINUM: Persistent cardiomegaly. Aortic calcification. BONES AND SOFT TISSUES: Status post median sternotomy. No acute osseous abnormality. IMPRESSION: 1. Persistent cardiomegaly. Electronically signed by: Norman Gatlin MD 05/09/2024 08:48 PM EST RP Workstation: [id number]   ECHOCARDIOGRAM LIMITED Result Date: 05/05/2024    ECHOCARDIOGRAM LIMITED REPORT   Patient Name:   Roberto [name] Date of Exam: 05/05/2024 Medical Rec #:  [id number]       Height:       66.0 in Accession #:    [id number]      Weight:       196.8 lb Date of Birth:  [birth year]       BSA:          1.986 m Patient Age:    68 years        BP:           112/78 mmHg Patient Gender: M               HR:           80 bpm. Exam Location:  [facility] Procedure: Limited Echo and Color Doppler (Both Spectral and Color Flow Doppler            were utilized during procedure). Indications:     Congestive Heart Failure I50.9  History:         Patient has prior history of Echocardiogram examinations, most                  recent 09/27/2023. CHF.  Sonographer:     Ashley McNeely-Sloane Referring Phys:  [id number] [facility] CAREY Diagnosing Phys: Deatrice Cage MD IMPRESSIONS  1. Left ventricular ejection fraction, by estimation, is <20%. The left ventricle has severely decreased function. Left ventricular endocardial border not optimally defined to evaluate regional wall motion. There is moderate left ventricular hypertrophy.  2. Right ventricular systolic function is moderately reduced. The right ventricular size is mildly enlarged. Tricuspid regurgitation signal is inadequate for assessing PA pressure.  3. The mitral valve is normal in  structure. Moderate mitral valve regurgitation. No evidence of mitral stenosis.  4. Tricuspid valve regurgitation is moderate to severe.  5. The aortic valve has been repaired/replaced. Aortic valve regurgitation is not visualized. Gradients were not measured.  6. The inferior  vena cava is dilated in size with <50% respiratory variability, suggesting right atrial pressure of 15 mmHg. FINDINGS  Left Ventricle: Left ventricular ejection fraction, by estimation, is <20%. The left ventricle has severely decreased function. Left ventricular endocardial border not optimally defined to evaluate regional wall motion. The left ventricular internal cavity size was normal in size. There is moderate left ventricular hypertrophy. Right Ventricle: The right ventricular size is mildly enlarged. No increase in right ventricular wall thickness. Right ventricular systolic function is moderately reduced. Tricuspid regurgitation signal is inadequate for assessing PA pressure. Left Atrium: Left atrial size was normal in size. Right Atrium: Right atrial size was normal in size. Pericardium: There is no evidence of pericardial effusion. Mitral Valve: The mitral valve is normal in structure. Moderate mitral valve regurgitation. No evidence of mitral valve stenosis. Tricuspid Valve: The tricuspid valve is normal in structure. Tricuspid valve regurgitation is moderate to severe. No evidence of tricuspid stenosis. Aortic Valve: The aortic valve has been repaired/replaced. Aortic valve regurgitation is not visualized. Pulmonic Valve: The pulmonic valve was normal in structure. Pulmonic valve regurgitation is not visualized. No evidence of pulmonic stenosis. Aorta: The aortic root is normal in size and structure. Venous: The inferior vena cava is dilated in size with less than 50% respiratory variability, suggesting right atrial pressure of 15 mmHg. IAS/Shunts: No atrial level shunt detected by color flow Doppler. Additional Comments: A device  lead is visualized. Color Doppler performed.  LEFT VENTRICLE PLAX 2D LVIDd:         5.80 cm LVIDs:         6.20 cm LV PW:         1.80 cm LV IVS:        1.10 cm  LV Volumes (MOD) LV vol d, MOD A2C: 150.0 ml LV vol d, MOD A4C: 188.0 ml LV vol s, MOD A2C: 122.0 ml LV vol s, MOD A4C: 145.0 ml LV SV MOD A2C:     28.0 ml LV SV MOD A4C:     188.0 ml LV SV MOD BP:      32.9 ml RIGHT VENTRICLE         IVC TAPSE (M-mode): 1.5 cm  IVC diam: 2.85 cm Deatrice Cage MD Electronically signed by Deatrice Cage MD Signature Date/Time: 05/05/2024/4:35:15 PM    Final    US  ABDOMEN LIMITED RUQ (LIVER/GB) Result Date: 05/05/2024 EXAM: Right Upper Quadrant Abdominal Ultrasound 05/05/2024 03:57:44 AM TECHNIQUE: Real-time ultrasonography of the right upper quadrant of the abdomen was performed. COMPARISON: US  Abdomen 03/07/2024. CLINICAL HISTORY: LFTs abnormal. FINDINGS: LIVER: Liver length measures 1.7 x 1.6 x 1.3 cm. The liver is heterogeneously echogenic and demonstrates a nodular ventral contour characteristic of cirrhosis. There is a simple cyst within the right hepatic lobe measuring approximately 16 mm in diameter. There is normal hepatopetal blood flow within the main portal vein. No intrahepatic biliary ductal dilatation. BILIARY SYSTEM: Gallbladder wall thickness measures 0.4 cm. No pericholecystic fluid. No cholelithiasis. Negative sonographic Murphy's sign. The patient is not focally tender over the gallbladder. Common bile duct measures 0.3 cm. OTHER: No right upper quadrant ascites. IMPRESSION: 1. Heterogeneous echogenicity and nodular ventral hepatic contour, characteristic of cirrhosis. Electronically signed by: Evalene Coho MD 05/05/2024 04:39 AM EST RP Workstation: [id number]   CT Head Wo Contrast Result Date: 05/05/2024 EXAM: CT HEAD WITHOUT CONTRAST 05/05/2024 01:27:13 AM TECHNIQUE: CT of the head was performed without the administration of intravenous contrast. Automated exposure control, iterative  reconstruction, and/or weight based adjustment  of the mA/kV was utilized to reduce the radiation dose to as low as reasonably achievable. COMPARISON: 04/30/2024 CLINICAL HISTORY: Mental status change, unknown cause. FINDINGS: BRAIN AND VENTRICLES: No acute hemorrhage. No evidence of acute infarct. No hydrocephalus. No extra-axial collection. No mass effect or midline shift. There is diffuse cerebral atrophy and chronic small vessel disease throughout the deep white matter. ORBITS: No acute abnormality. SINUSES: No acute abnormality. SOFT TISSUES AND SKULL: No acute soft tissue abnormality. No skull fracture. IMPRESSION: 1. No acute intracranial abnormality. 2. Diffuse cerebral atrophy and chronic small vessel disease throughout the deep white matter. Electronically signed by: Franky Crease MD 05/05/2024 01:32 AM EST RP Workstation: [id number]   DG Chest Port 1 View Result Date: 05/05/2024 EXAM: 1 VIEW XRAY OF THE CHEST 05/05/2024 01:17:36 AM COMPARISON: 04/30/2024 CLINICAL HISTORY: AMS hypoxemia hx CHF FINDINGS: LINES, TUBES AND DEVICES: Left AICD is unchanged. LUNGS AND PLEURA: Vascular congestion. Suspect early interstitial edema. No focal pulmonary opacity. No pleural effusion. No pneumothorax. HEART AND MEDIASTINUM: Cardiomegaly. No acute abnormality of the mediastinal silhouette. BONES AND SOFT TISSUES: No acute osseous abnormality. IMPRESSION: 1. Cardiomegaly with vascular congestion and suspected early interstitial edema. Electronically signed by: Franky Crease MD 05/05/2024 01:32 AM EST RP Workstation: [id number]   DG Lumbar Spine Complete Result Date: 05/02/2024 EXAM: 4 VIEW(S) XRAY OF THE LUMBAR SPINE 05/02/2024 06:43:00 PM COMPARISON: 11/15/2018 CLINICAL HISTORY: back pain FINDINGS: LUMBAR SPINE: BONES: Minimal anterolisthesis of L4-L5 is noted. No acute fracture. No aggressive appearing osseous lesion. DISCS AND DEGENERATIVE CHANGES: Minimal degenerative disc disease is noted at L1-L2, L2-L3, and  L3-L4. Posterior facet joint hypertrophy is noted at L4-L5. SOFT TISSUES: No acute abnormality. IMPRESSION: 1. Minimal anterolisthesis at L4-5 due to posterior facet arthropathy. 2. Mild multilevel degenerative disc disease at L1-2, L2-3, and L3-4. Electronically signed by: Lynwood Seip MD 05/02/2024 06:53 PM EDT RP Workstation: [id number]   DG Chest Portable 1 View Result Date: 04/30/2024 CLINICAL DATA:  Weakness. EXAM: PORTABLE CHEST 1 VIEW COMPARISON:  Chest radiograph dated 02/08/2024. FINDINGS: There is cardiomegaly with mild central vascular congestion. No focal consolidation, pleural effusion or pneumothorax. Left pectoral AICD device. No acute osseous pathology. Sternal fixation hardware. IMPRESSION: Cardiomegaly with mild central vascular congestion. Electronically Signed   By: Vanetta Chou M.D.   On: 04/30/2024 20:15   CT Head Wo Contrast Result Date: 04/30/2024 EXAM: CT HEAD AND CERVICAL SPINE 04/30/2024 07:39:30 PM TECHNIQUE: CT of the head and cervical spine was performed without the administration of intravenous contrast. Multiplanar reformatted images are provided for review. Automated exposure control, iterative reconstruction, and/or weight based adjustment of the mA/kV was utilized to reduce the radiation dose to as low as reasonably achievable. COMPARISON: None available. CLINICAL HISTORY: Head trauma, minor (Age >= 65y). RN notes: Pt arrived via EMS from home after wife drove him to the EMS base for AMS. EMS states wife said his last known well was this a.m. before she went to work and had minor hallucinations when she came home. FINDINGS: CT HEAD BRAIN AND VENTRICLES: No acute intracranial hemorrhage. No mass effect or midline shift. No abnormal extra-axial fluid collection. No evidence of acute infarct. No hydrocephalus. ORBITS: No acute abnormality. SINUSES AND MASTOIDS: No acute abnormality. SOFT TISSUES AND SKULL: No acute skull fracture. No acute soft tissue abnormality. CT  CERVICAL SPINE BONES AND ALIGNMENT: No acute fracture or traumatic malalignment. DEGENERATIVE CHANGES: No significant degenerative changes. SOFT TISSUES: No prevertebral soft tissue swelling. IMPRESSION: 1. No acute intracranial abnormality. 2. No acute  fracture or traumatic malalignment of the cervical spine. Electronically signed by: Dorethia Molt MD 04/30/2024 07:48 PM EDT RP Workstation: [id number]   CT Cervical Spine Wo Contrast Result Date: 04/30/2024 EXAM: CT HEAD AND CERVICAL SPINE 04/30/2024 07:39:30 PM TECHNIQUE: CT of the head and cervical spine was performed without the administration of intravenous contrast. Multiplanar reformatted images are provided for review. Automated exposure control, iterative reconstruction, and/or weight based adjustment of the mA/kV was utilized to reduce the radiation dose to as low as reasonably achievable. COMPARISON: None available. CLINICAL HISTORY: Head trauma, minor (Age >= 65y). RN notes: Pt arrived via EMS from home after wife drove him to the EMS base for AMS. EMS states wife said his last known well was this a.m. before she went to work and had minor hallucinations when she came home. FINDINGS: CT HEAD BRAIN AND VENTRICLES: No acute intracranial hemorrhage. No mass effect or midline shift. No abnormal extra-axial fluid collection. No evidence of acute infarct. No hydrocephalus. ORBITS: No acute abnormality. SINUSES AND MASTOIDS: No acute abnormality. SOFT TISSUES AND SKULL: No acute skull fracture. No acute soft tissue abnormality. CT CERVICAL SPINE BONES AND ALIGNMENT: No acute fracture or traumatic malalignment. DEGENERATIVE CHANGES: No significant degenerative changes. SOFT TISSUES: No prevertebral soft tissue swelling. IMPRESSION: 1. No acute intracranial abnormality. 2. No acute fracture or traumatic malalignment of the cervical spine. Electronically signed by: Dorethia Molt MD 04/30/2024 07:48 PM EDT RP Workstation: [id number]       Subjective:   Discharge Exam: Vitals:   05/14/24 0741 05/14/24 1204  BP:  101/60  Pulse:  60  Resp: 18 15  Temp:  97.6 F (36.4 C)  SpO2:  (!) 89%    General: Pt is alert, awake, not in acute distress Cardiovascular: rate controlled, S1/S2 + Respiratory: bilateral decreased breath sounds at bases Abdominal: Soft, NT, ND, bowel sounds + Extremities: no edema, no cyanosis    The results of significant diagnostics from this hospitalization (including imaging, microbiology, ancillary and laboratory) are listed below for reference.     Microbiology: Recent Results (from the past 240 hours)  Culture, blood (routine x 2)     Status: None   Collection Time: 05/05/24  1:10 AM   Specimen: Right Antecubital; Blood  Result Value Ref Range Status   Specimen Description RIGHT ANTECUBITAL  Final   Special Requests   Final    BOTTLES DRAWN AEROBIC AND ANAEROBIC Blood Culture adequate volume   Culture   Final    NO GROWTH 5 DAYS Performed at [facility], [address], [place], [place] [address]    Report Status 05/10/2024 FINAL  Final  Culture, blood (routine x 2)     Status: None   Collection Time: 05/05/24  1:14 AM   Specimen: Left Antecubital; Blood  Result Value Ref Range Status   Specimen Description LEFT ANTECUBITAL  Final   Special Requests   Final    BOTTLES DRAWN AEROBIC AND ANAEROBIC Blood Culture adequate volume   Culture   Final    NO GROWTH 5 DAYS Performed at [facility], [address] [place], [place] [address]    Report Status 05/10/2024 FINAL  Final  Resp panel by RT-PCR (RSV, Flu A&B, Covid) Anterior Nasal Swab     Status: None   Collection Time: 05/05/24  1:14 AM   Specimen: Anterior Nasal Swab  Result Value Ref Range Status   SARS Coronavirus 2 by RT PCR NEGATIVE NEGATIVE Final    Comment: (NOTE) SARS-CoV-2  target nucleic acids are NOT DETECTED.  The SARS-CoV-2 RNA is generally detectable in upper respiratory specimens  during the acute phase of infection. The lowest concentration of SARS-CoV-2 viral copies this assay can detect is 138 copies/mL. A negative result does not preclude SARS-Cov-2 infection and should not be used as the sole basis for treatment or other patient management decisions. A negative result may occur with  improper specimen collection/handling, submission of specimen other than nasopharyngeal swab, presence of viral mutation(s) within the areas targeted by this assay, and inadequate number of viral copies(<138 copies/mL). A negative result must be combined with clinical observations, patient history, and epidemiological information. The expected result is Negative.  Fact Sheet for Patients:  bloggercourse.com  Fact Sheet for Healthcare Providers:  seriousbroker.it  This test is no t yet approved or cleared by the [place]  FDA and  has been authorized for detection and/or diagnosis of SARS-CoV-2 by FDA under an Emergency Use Authorization (EUA). This EUA will remain  in effect (meaning this test can be used) for the duration of the COVID-19 declaration under Section 564(b)(1) of the Act, 21 U.S.C.section 360bbb-3(b)(1), unless the authorization is terminated  or revoked sooner.       Influenza A by PCR NEGATIVE NEGATIVE Final   Influenza B by PCR NEGATIVE NEGATIVE Final    Comment: (NOTE) The Xpert Xpress SARS-CoV-2/FLU/RSV plus assay is intended as an aid in the diagnosis of influenza from Nasopharyngeal swab specimens and should not be used as a sole basis for treatment. Nasal washings and aspirates are unacceptable for Xpert Xpress SARS-CoV-2/FLU/RSV testing.  Fact Sheet for Patients: bloggercourse.com  Fact Sheet for Healthcare Providers: seriousbroker.it  This test is not yet approved or cleared by the [place]  FDA and has been authorized for detection  and/or diagnosis of SARS-CoV-2 by FDA under an Emergency Use Authorization (EUA). This EUA will remain in effect (meaning this test can be used) for the duration of the COVID-19 declaration under Section 564(b)(1) of the Act, 21 U.S.C. section 360bbb-3(b)(1), unless the authorization is terminated or revoked.     Resp Syncytial Virus by PCR NEGATIVE NEGATIVE Final    Comment: (NOTE) Fact Sheet for Patients: bloggercourse.com  Fact Sheet for Healthcare Providers: seriousbroker.it  This test is not yet approved or cleared by the [place]  FDA and has been authorized for detection and/or diagnosis of SARS-CoV-2 by FDA under an Emergency Use Authorization (EUA). This EUA will remain in effect (meaning this test can be used) for the duration of the COVID-19 declaration under Section 564(b)(1) of the Act, 21 U.S.C. section 360bbb-3(b)(1), unless the authorization is terminated or revoked.  Performed at [facility], [address] [place], [place] [address]      Labs: BNP (last 3 results) Recent Labs    03/19/24 1600 05/05/24 0114 05/09/24 1945  BNP 773.9* 470.9* 749.4*   Basic Metabolic Panel: Recent Labs  Lab 05/08/24 0215 05/09/24 0318 05/09/24 1945 05/11/24 0758 05/12/24 0252 05/13/24 0258 05/14/24 0806  NA 140 140 139 138 141 145 142  K 3.4* 4.7 3.5 3.6 3.4* 3.3* 3.9  CL 100 101 102 100 107 108 105  CO2 26 28 20* 25 21* 25 23  GLUCOSE 116* 93 125* 102* 111* 99 138*  BUN 23 24* 23 21 21 17 17   CREATININE 1.61* 1.63* 1.85* 1.79* 1.86* 1.67* 1.69*  CALCIUM  8.9 8.7* 9.0 9.4 9.1 8.8* 9.3  MG 2.0 2.2 1.7  --   --  2.0  --  Liver Function Tests: Recent Labs  Lab 05/08/24 0215 05/09/24 0318 05/09/24 1945 05/13/24 0258 05/14/24 0806  AST 107* 64* 47* 37 38  ALT 196* 147* 121* 66* 47*  ALKPHOS 74 72 72 76 81  BILITOT 0.9 1.5* 0.7 0.5 0.8  PROT 6.9 7.4 6.3* 6.1* 6.6  ALBUMIN 3.1* 3.2* 3.1*  2.8* 3.0*   No results for input(s): LIPASE, AMYLASE in the last 168 hours. Recent Labs  Lab 05/13/24 0810  AMMONIA 24   CBC: Recent Labs  Lab 05/09/24 1945 05/11/24 0758 05/12/24 0252 05/13/24 0258 05/14/24 0806  WBC 7.8 6.7 7.9 9.6 10.2  NEUTROABS 6.2  --   --   --   --   HGB 12.2* 12.1* 11.8* 12.0* 12.3*  HCT 38.0* 38.4* 38.1* 38.4* 39.8  MCV 91.3 91.4 92.0 91.9 92.3  PLT 330 318 340 324 371   Cardiac Enzymes: No results for input(s): CKTOTAL, CKMB, CKMBINDEX, TROPONINI in the last 168 hours. BNP: Invalid input(s): POCBNP CBG: No results for input(s): GLUCAP in the last 168 hours. D-Dimer No results for input(s): DDIMER in the last 72 hours. Hgb A1c No results for input(s): HGBA1C in the last 72 hours. Lipid Profile No results for input(s): CHOL, HDL, LDLCALC, TRIG, CHOLHDL, LDLDIRECT in the last 72 hours. Thyroid  function studies No results for input(s): TSH, T4TOTAL, T3FREE, THYROIDAB in the last 72 hours.  Invalid input(s): FREET3 Anemia work up No results for input(s): VITAMINB12, FOLATE, FERRITIN, TIBC, IRON, RETICCTPCT in the last 72 hours. Urinalysis    Component Value Date/Time   COLORURINE YELLOW 05/09/2024 1838   APPEARANCEUR CLEAR 05/09/2024 1838   APPEARANCEUR Clear 05/16/2023 0849   LABSPEC 1.015 05/09/2024 1838   LABSPEC 1.005 05/24/2014 1157   PHURINE 5.0 05/09/2024 1838   GLUCOSEU >=500 (A) 05/09/2024 1838   GLUCOSEU Negative 05/24/2014 1157   HGBUR NEGATIVE 05/09/2024 1838   BILIRUBINUR NEGATIVE 05/09/2024 1838   BILIRUBINUR Negative 05/16/2023 0849   BILIRUBINUR Negative 05/24/2014 1157   KETONESUR NEGATIVE 05/09/2024 1838   PROTEINUR 30 (A) 05/09/2024 1838   NITRITE NEGATIVE 05/09/2024 1838   LEUKOCYTESUR NEGATIVE 05/09/2024 1838   LEUKOCYTESUR Negative 05/24/2014 1157   Sepsis Labs Recent Labs  Lab 05/11/24 0758 05/12/24 0252 05/13/24 0258 05/14/24 0806  WBC 6.7 7.9 9.6  10.2   Microbiology Recent Results (from the past 240 hours)  Culture, blood (routine x 2)     Status: None   Collection Time: 05/05/24  1:10 AM   Specimen: Right Antecubital; Blood  Result Value Ref Range Status   Specimen Description RIGHT ANTECUBITAL  Final   Special Requests   Final    BOTTLES DRAWN AEROBIC AND ANAEROBIC Blood Culture adequate volume   Culture   Final    NO GROWTH 5 DAYS Performed at [facility], [address] [place], [place] [address]    Report Status 05/10/2024 FINAL  Final  Culture, blood (routine x 2)     Status: None   Collection Time: 05/05/24  1:14 AM   Specimen: Left Antecubital; Blood  Result Value Ref Range Status   Specimen Description LEFT ANTECUBITAL  Final   Special Requests   Final    BOTTLES DRAWN AEROBIC AND ANAEROBIC Blood Culture adequate volume   Culture   Final    NO GROWTH 5 DAYS Performed at [facility], [address], [place], [place] [address]    Report Status 05/10/2024 FINAL  Final  Resp panel by RT-PCR (RSV, Flu A&B, Covid) Anterior Nasal  Swab     Status: None   Collection Time: 05/05/24  1:14 AM   Specimen: Anterior Nasal Swab  Result Value Ref Range Status   SARS Coronavirus 2 by RT PCR NEGATIVE NEGATIVE Final    Comment: (NOTE) SARS-CoV-2 target nucleic acids are NOT DETECTED.  The SARS-CoV-2 RNA is generally detectable in upper respiratory specimens during the acute phase of infection. The lowest concentration of SARS-CoV-2 viral copies this assay can detect is 138 copies/mL. A negative result does not preclude SARS-Cov-2 infection and should not be used as the sole basis for treatment or other patient management decisions. A negative result may occur with  improper specimen collection/handling, submission of specimen other than nasopharyngeal swab, presence of viral mutation(s) within the areas targeted by this assay, and inadequate number of viral copies(<138 copies/mL). A negative result  must be combined with clinical observations, patient history, and epidemiological information. The expected result is Negative.  Fact Sheet for Patients:  bloggercourse.com  Fact Sheet for Healthcare Providers:  seriousbroker.it  This test is no t yet approved or cleared by the [place]  FDA and  has been authorized for detection and/or diagnosis of SARS-CoV-2 by FDA under an Emergency Use Authorization (EUA). This EUA will remain  in effect (meaning this test can be used) for the duration of the COVID-19 declaration under Section 564(b)(1) of the Act, 21 U.S.C.section 360bbb-3(b)(1), unless the authorization is terminated  or revoked sooner.       Influenza A by PCR NEGATIVE NEGATIVE Final   Influenza B by PCR NEGATIVE NEGATIVE Final    Comment: (NOTE) The Xpert Xpress SARS-CoV-2/FLU/RSV plus assay is intended as an aid in the diagnosis of influenza from Nasopharyngeal swab specimens and should not be used as a sole basis for treatment. Nasal washings and aspirates are unacceptable for Xpert Xpress SARS-CoV-2/FLU/RSV testing.  Fact Sheet for Patients: bloggercourse.com  Fact Sheet for Healthcare Providers: seriousbroker.it  This test is not yet approved or cleared by the [place]  FDA and has been authorized for detection and/or diagnosis of SARS-CoV-2 by FDA under an Emergency Use Authorization (EUA). This EUA will remain in effect (meaning this test can be used) for the duration of the COVID-19 declaration under Section 564(b)(1) of the Act, 21 U.S.C. section 360bbb-3(b)(1), unless the authorization is terminated or revoked.     Resp Syncytial Virus by PCR NEGATIVE NEGATIVE Final    Comment: (NOTE) Fact Sheet for Patients: bloggercourse.com  Fact Sheet for Healthcare Providers: seriousbroker.it  This test is  not yet approved or cleared by the [place]  FDA and has been authorized for detection and/or diagnosis of SARS-CoV-2 by FDA under an Emergency Use Authorization (EUA). This EUA will remain in effect (meaning this test can be used) for the duration of the COVID-19 declaration under Section 564(b)(1) of the Act, 21 U.S.C. section 360bbb-3(b)(1), unless the authorization is terminated or revoked.  Performed at [facility], [address], [place], [place] [address]      Time coordinating discharge: 35 minutes  SIGNED:   Derryl Duval, MD  [facility] 05/14/2024, 3:05 PM

## 2024-05-14 NOTE — Progress Notes (Signed)
 Mobility Specialist Progress Note:    05/14/24 0955  Mobility  Activity Ambulated with assistance  Level of Assistance Minimal assist, patient does 75% or more  Assistive Device Other (Comment) (HHA)  Distance Ambulated (ft) 50 ft  Range of Motion/Exercises Active  Activity Response Tolerated fair;RN notified  Mobility Referral Yes  Mobility visit 1 Mobility  Mobility Specialist Start Time (ACUTE ONLY) 0955  Mobility Specialist Stop Time (ACUTE ONLY) 1016  Mobility Specialist Time Calculation (min) (ACUTE ONLY) 21 min   Received pt sitting EOB agreeable to session. Pt c/o of right knee buckling, inability to use left arm d/t procedure, and lower back pain making it difficult to walk. Pt able to stand and balance w/ CGA. Needing minA to ambulate. Returned pt to room w/ all needs met.   Venetia Keel Mobility Specialist Please Neurosurgeon or Rehab Office at [phone]

## 2024-05-14 NOTE — TOC Transition Note (Addendum)
 Transition of Care [facility]) - Discharge Note   Patient Details  Name: Lillie Bollig MRN: [id number] Date of Birth: 1955/12/11  [facility] CM/SW Contact:  Waddell Barnie Rama, RN Phone Number: 05/14/2024, 11:41 AM   Clinical Narrative:    For dc today, patient' girlfriend , [name] states she was told he can do the outpatient PT at [facility] in [place], [place] put in ambulatory referral for him, awaiting MD to sign off so patient can take with him to schedule apt.   MD signed the referral for OP PT.         Patient Goals and CMS Choice            Discharge Placement                       Discharge Plan and Services Additional resources added to the After Visit Summary for                                       Social Drivers of Health (SDOH) Interventions SDOH Screenings   Food Insecurity: Food Insecurity Present (05/10/2024)  Housing: Low Risk  (05/10/2024)  Transportation Needs: No Transportation Needs (05/10/2024)  Recent Concern: Transportation Needs - Unmet Transportation Needs (05/01/2024)  Utilities: Not At Risk (05/10/2024)  Alcohol Screen: Low Risk  (09/04/2023)  Depression (PHQ2-9): High Risk (03/28/2024)  Financial Resource Strain: High Risk (04/21/2024)  Physical Activity: Inactive (04/21/2024)  Social Connections: Socially Isolated (05/10/2024)  Stress: Stress Concern Present (04/21/2024)  Tobacco Use: High Risk (05/09/2024)  Health Literacy: Adequate Health Literacy (09/04/2023)     Readmission Risk Interventions    08/29/2023    3:19 PM 06/18/2023   10:33 AM  Readmission Risk Prevention Plan  Transportation Screening Complete Complete  PCP or Specialist Appt within 3-5 Days  Complete  HRI or Home Care Consult Complete Complete  Social Work Consult for Recovery Care Planning/Counseling Complete Patient refused  Palliative Care Screening Not Applicable Not Applicable  Medication Review Oceanographer) Referral to  Pharmacy Complete

## 2024-05-15 ENCOUNTER — Other Ambulatory Visit: Payer: Self-pay | Admitting: Family Medicine

## 2024-05-15 ENCOUNTER — Telehealth: Payer: Self-pay

## 2024-05-16 MED ORDER — ALLOPURINOL 100 MG PO TABS: 100.0000 mg | ORAL_TABLET | Freq: Every day | ORAL | 0 refills | Status: DC

## 2024-05-16 NOTE — Telephone Encounter (Signed)
 Requested medication (s) are due for refill today - yes  Requested medication (s) are on the active medication list -yes  Future visit scheduled -yes  Last refill: Pantoprazole - please review Rx- patient taking differently, 04/25/24 #60                  Tramadol- 05/04/24 #15- non delegated Rx, outside provider  Notes to clinic: see above  Requested Prescriptions  Pending Prescriptions Disp Refills   traMADol (ULTRAM) 50 MG tablet 90 tablet 1    Sig: Take 1 tablet (50 mg total) by mouth every 6 (six) hours as needed for moderate pain (pain score 4-6).     Not Delegated - Analgesics:  Opioid Agonists Failed - 05/16/2024  4:30 PM      Failed - This refill cannot be delegated      Passed - Urine Drug Screen completed in last 360 days      Passed - Valid encounter within last 3 months    Recent Outpatient Visits           3 weeks ago Hypotension due to hypovolemia   [facility] [place], [name], DO   1 month ago Cellulitis of right upper extremity   [place] [facility], Leita DEL, MD   1 month ago Supratherapeutic INR   [place] [place], [place] P, DO   1 month ago Supratherapeutic INR   [facility] [place], [facility], DO   2 months ago Supratherapeutic INR   [place] [place], [place], DO       Future Appointments             In 2 weeks End, [name], MD [place] [facility]             pantoprazole  (PROTONIX ) 40 MG tablet 60 tablet     Sig: Take 1 tablet (40 mg total) by mouth 2 (two) times daily.     Gastroenterology: Proton Pump Inhibitors Passed - 05/16/2024  4:30 PM      Passed - Valid encounter within last 12 months    Recent Outpatient Visits           3 weeks ago Hypotension due to hypovolemia   [facility] [place], [name], DO   1 month ago Cellulitis of right  upper extremity   [place] [facility], Leita DEL, MD   1 month ago Supratherapeutic INR   [place] [facility] [place], [place], DO   1 month ago Supratherapeutic INR   [facility] [place], [place], DO   2 months ago Supratherapeutic INR   [place] [facility] [place], [place], DO       Future Appointments             In 2 weeks End, [name], MD [facility]            Signed Prescriptions Disp Refills   potassium chloride  (MICRO-K ) 10 MEQ CR capsule 30 capsule 0    Sig: Take 1 capsule by mouth once daily     Endocrinology:  Minerals - Potassium Supplementation Failed - 05/16/2024  4:30 PM      Failed - Cr in normal range and within 360 days    Creatinine  Date Value Ref Range  Status  05/25/2014 0.85 0.60 - 1.30 mg/dL Final   Creatinine, Ser  Date Value Ref Range Status  05/14/2024 1.69 (H) 0.61 - 1.24 mg/dL Final         Passed - K in normal range and within 360 days    Potassium  Date Value Ref Range Status  05/14/2024 3.9 3.5 - 5.1 mmol/L Final  05/25/2014 3.6 3.5 - 5.1 mmol/L Final         Passed - Valid encounter within last 12 months    Recent Outpatient Visits           3 weeks ago Hypotension due to hypovolemia   [place] [facility] [place], [name], DO   1 month ago Cellulitis of right upper extremity   [place] [facility], Leita DEL, MD   1 month ago Supratherapeutic INR   [place] [facility] [place], [place] P, DO   1 month ago Supratherapeutic INR   [place] [facility] [place], [place], DO   2 months ago Supratherapeutic INR   [place] [facility] [place], [place], DO       Future Appointments             In 2 weeks End, [name], MD [place] [facility]              allopurinol  (ZYLOPRIM ) 100 MG tablet 30 tablet 0    Sig: Take 1 tablet (100 mg total) by mouth daily.     Endocrinology:  Gout Agents - allopurinol  Failed - 05/16/2024  4:30 PM      Failed - Cr in normal range and within 360 days    Creatinine  Date Value Ref Range Status  05/25/2014 0.85 0.60 - 1.30 mg/dL Final   Creatinine, Ser  Date Value Ref Range Status  05/14/2024 1.69 (H) 0.61 - 1.24 mg/dL Final         Failed - CBC within normal limits and completed in the last 12 months    WBC  Date Value Ref Range Status  05/14/2024 10.2 4.0 - 10.5 K/uL Final   RBC  Date Value Ref Range Status  05/14/2024 4.31 4.22 - 5.81 MIL/uL Final   Hemoglobin  Date Value Ref Range Status  05/14/2024 12.3 (L) 13.0 - 17.0 g/dL Final  89/77/7974 85.5 13.0 - 17.7 g/dL Final   HCT  Date Value Ref Range Status  05/14/2024 39.8 39.0 - 52.0 % Final   Hematocrit  Date Value Ref Range Status  04/23/2024 45.0 37.5 - 51.0 % Final   MCHC  Date Value Ref Range Status  05/14/2024 30.9 30.0 - 36.0 g/dL Final   [facility]  Date Value Ref Range Status  05/14/2024 28.5 26.0 - 34.0 pg Final   MCV  Date Value Ref Range Status  05/14/2024 92.3 80.0 - 100.0 fL Final  04/23/2024 91 79 - 97 fL Final  05/25/2014 88 80 - 100 fL Final   No results found for: PLTCOUNTKUC, LABPLAT, POCPLA RDW  Date Value Ref Range Status  05/14/2024 18.2 (H) 11.5 - 15.5 % Final  04/23/2024 15.0 11.6 - 15.4 % Final  05/25/2014 13.9 11.5 - 14.5 % Final         Passed - Uric Acid in normal range and within 360 days    Uric Acid  Date Value Ref Range Status  03/17/2024 5.6 3.8 - 8.4 mg/dL Final  Comment:               Therapeutic target for gout patients: <6.0         Passed - Valid encounter within last 12 months    Recent Outpatient Visits           3 weeks ago Hypotension due to hypovolemia   [facility] [place], [name], DO   1 month ago Cellulitis of right upper extremity   [facility], Leita DEL, MD   1 month ago Supratherapeutic INR   [place] [place], [name], DO   1 month ago Supratherapeutic INR   [place] [facility] [place], [place], DO   2 months ago Supratherapeutic INR   [place] [facility] [place], [place], DO       Future Appointments             In 2 weeks End, [name], MD [facility]               Requested Prescriptions  Pending Prescriptions Disp Refills   traMADol (ULTRAM) 50 MG tablet 90 tablet 1    Sig: Take 1 tablet (50 mg total) by mouth every 6 (six) hours as needed for moderate pain (pain score 4-6).     Not Delegated - Analgesics:  Opioid Agonists Failed - 05/16/2024  4:30 PM      Failed - This refill cannot be delegated      Passed - Urine Drug Screen completed in last 360 days      Passed - Valid encounter within last 3 months    Recent Outpatient Visits           3 weeks ago Hypotension due to hypovolemia   [place] [name] [place], [name], DO   1 month ago Cellulitis of right upper extremity   [place] [facility], Leita DEL, MD   1 month ago Supratherapeutic INR   [place] [facility] [place], [name], DO   1 month ago Supratherapeutic INR   [place] [facility] [place], [place], DO   2 months ago Supratherapeutic INR   [place] [facility] [place], [place], DO       Future Appointments             In 2 weeks End, [name], MD [place] [facility]             pantoprazole  (PROTONIX ) 40 MG tablet 60 tablet     Sig: Take 1 tablet (40 mg total) by mouth 2 (two) times daily.     Gastroenterology: Proton Pump Inhibitors Passed - 05/16/2024  4:30 PM      Passed - Valid encounter within last 12 months    Recent Outpatient  Visits           3 weeks ago Hypotension due to hypovolemia   [place] [facility] [place], [name], DO   1 month ago Cellulitis of right upper extremity   [place] [facility] at [facility], Leita DEL, MD   1 month ago Supratherapeutic INR   [place] [facility] [place], [place] P, DO   1 month ago Supratherapeutic INR   [place] [facility] [place], [place], [place]  2 months ago Supratherapeutic INR   [place] [facility] [place], [place], DO       Future Appointments             In 2 weeks End, [name], MD [facility]            Signed Prescriptions Disp Refills   potassium chloride  (MICRO-K ) 10 MEQ CR capsule 30 capsule 0    Sig: Take 1 capsule by mouth once daily     Endocrinology:  Minerals - Potassium Supplementation Failed - 05/16/2024  4:30 PM      Failed - Cr in normal range and within 360 days    Creatinine  Date Value Ref Range Status  05/25/2014 0.85 0.60 - 1.30 mg/dL Final   Creatinine, Ser  Date Value Ref Range Status  05/14/2024 1.69 (H) 0.61 - 1.24 mg/dL Final         Passed - K in normal range and within 360 days    Potassium  Date Value Ref Range Status  05/14/2024 3.9 3.5 - 5.1 mmol/L Final  05/25/2014 3.6 3.5 - 5.1 mmol/L Final         Passed - Valid encounter within last 12 months    Recent Outpatient Visits           3 weeks ago Hypotension due to hypovolemia   [place] [facility] [place], [name], DO   1 month ago Cellulitis of right upper extremity   [place] [facility], Leita DEL, MD   1 month ago Supratherapeutic INR   [place] [facility] [place], [place], DO   1 month ago Supratherapeutic INR   [place] [facility] [place], [place], DO   2 months ago Supratherapeutic INR   [place] [facility]  [place], [place], DO       Future Appointments             In 2 weeks End, [name], MD [place] [facility]             allopurinol  (ZYLOPRIM ) 100 MG tablet 30 tablet 0    Sig: Take 1 tablet (100 mg total) by mouth daily.     Endocrinology:  Gout Agents - allopurinol  Failed - 05/16/2024  4:30 PM      Failed - Cr in normal range and within 360 days    Creatinine  Date Value Ref Range Status  05/25/2014 0.85 0.60 - 1.30 mg/dL Final   Creatinine, Ser  Date Value Ref Range Status  05/14/2024 1.69 (H) 0.61 - 1.24 mg/dL Final         Failed - CBC within normal limits and completed in the last 12 months    WBC  Date Value Ref Range Status  05/14/2024 10.2 4.0 - 10.5 K/uL Final   RBC  Date Value Ref Range Status  05/14/2024 4.31 4.22 - 5.81 MIL/uL Final   Hemoglobin  Date Value Ref Range Status  05/14/2024 12.3 (L) 13.0 - 17.0 g/dL Final  89/77/7974 85.5 13.0 - 17.7 g/dL Final   HCT  Date Value Ref Range Status  05/14/2024 39.8 39.0 - 52.0 % Final   Hematocrit  Date Value Ref Range Status  04/23/2024 45.0 37.5 - 51.0 % Final   MCHC  Date Value Ref Range Status  05/14/2024 30.9 30.0 - 36.0 g/dL Final   [facility]  Date Value Ref  Range Status  05/14/2024 28.5 26.0 - 34.0 pg Final   MCV  Date Value Ref Range Status  05/14/2024 92.3 80.0 - 100.0 fL Final  04/23/2024 91 79 - 97 fL Final  05/25/2014 88 80 - 100 fL Final   No results found for: PLTCOUNTKUC, LABPLAT, POCPLA RDW  Date Value Ref Range Status  05/14/2024 18.2 (H) 11.5 - 15.5 % Final  04/23/2024 15.0 11.6 - 15.4 % Final  05/25/2014 13.9 11.5 - 14.5 % Final         Passed - Uric Acid in normal range and within 360 days    Uric Acid  Date Value Ref Range Status  03/17/2024 5.6 3.8 - 8.4 mg/dL Final    Comment:               Therapeutic target for gout patients: <6.0         Passed - Valid encounter within last 12 months    Recent Outpatient Visits           3 weeks ago  Hypotension due to hypovolemia   [place] [facility] [place], [name], DO   1 month ago Cellulitis of right upper extremity   [place] [facility], Leita DEL, MD   1 month ago Supratherapeutic INR   [place] [facility] [place], [place], DO   1 month ago Supratherapeutic INR   [place] [place], [place], DO   2 months ago Supratherapeutic INR   [place] [facility] [place], Duwaine SQUIBB, DO       Future Appointments             In 2 weeks End, [name], MD [facility]

## 2024-05-16 NOTE — Telephone Encounter (Signed)
 Requested medication (s) are due for refill today: yes  Requested medication (s) are on the active medication list: yes  Last refill:  multiple dates  Future visit scheduled: yes  Notes to clinic:  Unable to refill per protocol, last refill by another provider.      Requested Prescriptions  Pending Prescriptions Disp Refills   potassium chloride  (MICRO-K ) 10 MEQ CR capsule [Pharmacy Med Name: Potassium Chloride  ER 10 MEQ Oral Capsule Extended Release] 30 capsule 0    Sig: Take 1 capsule by mouth once daily     Endocrinology:  Minerals - Potassium Supplementation Failed - 05/16/2024  4:21 PM      Failed - Cr in normal range and within 360 days    Creatinine  Date Value Ref Range Status  05/25/2014 0.85 0.60 - 1.30 mg/dL Final   Creatinine, Ser  Date Value Ref Range Status  05/14/2024 1.69 (H) 0.61 - 1.24 mg/dL Final         Passed - K in normal range and within 360 days    Potassium  Date Value Ref Range Status  05/14/2024 3.9 3.5 - 5.1 mmol/L Final  05/25/2014 3.6 3.5 - 5.1 mmol/L Final         Passed - Valid encounter within last 12 months    Recent Outpatient Visits           3 weeks ago Hypotension due to hypovolemia   [place] [facility] [place], [name], DO   1 month ago Cellulitis of right upper extremity   [place] [facility], Leita DEL, MD   1 month ago Supratherapeutic INR   [place] [facility] [place], [name], DO   1 month ago Supratherapeutic INR   [place] [facility] [place], [place], DO   2 months ago Supratherapeutic INR   [place] [facility] [place], Megan [place], DO       Future Appointments             In 2 weeks End, [name], MD [place] [facility]             traMADol (ULTRAM) 50 MG tablet 90 tablet 1    Sig: Take 1 tablet (50 mg total) by mouth every 6 (six) hours as needed for moderate pain (pain  score 4-6).     Not Delegated - Analgesics:  Opioid Agonists Failed - 05/16/2024  4:21 PM      Failed - This refill cannot be delegated      Passed - Urine Drug Screen completed in last 360 days      Passed - Valid encounter within last 3 months    Recent Outpatient Visits           3 weeks ago Hypotension due to hypovolemia   [place] [facility] [place], [name], DO   1 month ago Cellulitis of right upper extremity   [place] [facility], Leita DEL, MD   1 month ago Supratherapeutic INR   [place] [facility] [place], [place] P, DO   1 month ago Supratherapeutic INR   [place] [facility] [place], [place], DO   2 months ago Supratherapeutic INR    [facility] [place], Duwaine SQUIBB, DO       Future Appointments  In 2 weeks End, [name], MD [facility]             allopurinol  (ZYLOPRIM ) 100 MG tablet 30 tablet 0    Sig: Take 1 tablet (100 mg total) by mouth daily.     Endocrinology:  Gout Agents - allopurinol  Failed - 05/16/2024  4:21 PM      Failed - Cr in normal range and within 360 days    Creatinine  Date Value Ref Range Status  05/25/2014 0.85 0.60 - 1.30 mg/dL Final   Creatinine, Ser  Date Value Ref Range Status  05/14/2024 1.69 (H) 0.61 - 1.24 mg/dL Final         Failed - CBC within normal limits and completed in the last 12 months    WBC  Date Value Ref Range Status  05/14/2024 10.2 4.0 - 10.5 K/uL Final   RBC  Date Value Ref Range Status  05/14/2024 4.31 4.22 - 5.81 MIL/uL Final   Hemoglobin  Date Value Ref Range Status  05/14/2024 12.3 (L) 13.0 - 17.0 g/dL Final  89/77/7974 85.5 13.0 - 17.7 g/dL Final   HCT  Date Value Ref Range Status  05/14/2024 39.8 39.0 - 52.0 % Final   Hematocrit  Date Value Ref Range Status  04/23/2024 45.0 37.5 - 51.0 % Final   MCHC  Date Value Ref Range Status   05/14/2024 30.9 30.0 - 36.0 g/dL Final   [facility]  Date Value Ref Range Status  05/14/2024 28.5 26.0 - 34.0 pg Final   MCV  Date Value Ref Range Status  05/14/2024 92.3 80.0 - 100.0 fL Final  04/23/2024 91 79 - 97 fL Final  05/25/2014 88 80 - 100 fL Final   No results found for: PLTCOUNTKUC, LABPLAT, POCPLA RDW  Date Value Ref Range Status  05/14/2024 18.2 (H) 11.5 - 15.5 % Final  04/23/2024 15.0 11.6 - 15.4 % Final  05/25/2014 13.9 11.5 - 14.5 % Final         Passed - Uric Acid in normal range and within 360 days    Uric Acid  Date Value Ref Range Status  03/17/2024 5.6 3.8 - 8.4 mg/dL Final    Comment:               Therapeutic target for gout patients: <6.0         Passed - Valid encounter within last 12 months    Recent Outpatient Visits           3 weeks ago Hypotension due to hypovolemia   [place] [facility], [name], DO   1 month ago Cellulitis of right upper extremity   [place] [facility], Leita DEL, MD   1 month ago Supratherapeutic INR   [place] [facility] [place], [place], DO   1 month ago Supratherapeutic INR   [facility] [place], [place], DO   2 months ago Supratherapeutic INR   [place] [facility] [place], [place], DO       Future Appointments             In 2 weeks End, [name], MD [place] [facility]             pantoprazole  (PROTONIX ) 40 MG tablet 60 tablet     Sig: Take 1 tablet (40 mg total) by mouth 2 (two) times daily.  Gastroenterology: Proton Pump Inhibitors Passed - 05/16/2024  4:21 PM      Passed - Valid encounter within last 12 months    Recent Outpatient Visits           3 weeks ago Hypotension due to hypovolemia   [facility] [place], [name], DO   1 month ago Cellulitis of right upper extremity   [place] [facility], Leita DEL, MD   1 month ago Supratherapeutic INR   [place] [facility] [place], [place], DO   1 month ago Supratherapeutic INR   [place] [facility] [place], [place], DO   2 months ago Supratherapeutic INR    [facility] [place], Duwaine SQUIBB, DO       Future Appointments             In 2 weeks End, [name], MD [facility]

## 2024-05-16 NOTE — Telephone Encounter (Signed)
 Appt 05/20/24 Requested Prescriptions  Pending Prescriptions Disp Refills   potassium chloride  (MICRO-K ) 10 MEQ CR capsule [Pharmacy Med Name: Potassium Chloride  ER 10 MEQ Oral Capsule Extended Release] 30 capsule 0    Sig: Take 1 capsule by mouth once daily     Endocrinology:  Minerals - Potassium Supplementation Failed - 05/16/2024  4:28 PM      Failed - Cr in normal range and within 360 days    Creatinine  Date Value Ref Range Status  05/25/2014 0.85 0.60 - 1.30 mg/dL Final   Creatinine, Ser  Date Value Ref Range Status  05/14/2024 1.69 (H) 0.61 - 1.24 mg/dL Final         Passed - K in normal range and within 360 days    Potassium  Date Value Ref Range Status  05/14/2024 3.9 3.5 - 5.1 mmol/L Final  05/25/2014 3.6 3.5 - 5.1 mmol/L Final         Passed - Valid encounter within last 12 months    Recent Outpatient Visits           3 weeks ago Hypotension due to hypovolemia   [facility] [place], [name], DO   1 month ago Cellulitis of right upper extremity   [place] [facility], Leita DEL, MD   1 month ago Supratherapeutic INR   [place] [facility] [place], [name], DO   1 month ago Supratherapeutic INR   [place] [facility] [place], [place], DO   2 months ago Supratherapeutic INR   [place] [facility] [place], Megan [place], DO       Future Appointments             In 2 weeks End, [name], MD [place] [facility]             traMADol (ULTRAM) 50 MG tablet 90 tablet 1    Sig: Take 1 tablet (50 mg total) by mouth every 6 (six) hours as needed for moderate pain (pain score 4-6).     Not Delegated - Analgesics:  Opioid Agonists Failed - 05/16/2024  4:28 PM      Failed - This refill cannot be delegated      Passed - Urine Drug Screen completed in last 360 days      Passed - Valid encounter within last 3 months    Recent  Outpatient Visits           3 weeks ago Hypotension due to hypovolemia   [facility] [place], [name], DO   1 month ago Cellulitis of right upper extremity   [place] [facility], Leita DEL, MD   1 month ago Supratherapeutic INR   [place] [facility] [place], [place] P, DO   1 month ago Supratherapeutic INR   [facility] [place], [place], DO   2 months ago Supratherapeutic INR   [place] [facility] [place], [place], DO       Future Appointments             In 2 weeks End, [name], MD [place] [facility]             allopurinol  (ZYLOPRIM ) 100 MG tablet 30 tablet 0    Sig: Take 1 tablet (100 mg total) by mouth daily.  Endocrinology:  Gout Agents - allopurinol  Failed - 05/16/2024  4:28 PM      Failed - Cr in normal range and within 360 days    Creatinine  Date Value Ref Range Status  05/25/2014 0.85 0.60 - 1.30 mg/dL Final   Creatinine, Ser  Date Value Ref Range Status  05/14/2024 1.69 (H) 0.61 - 1.24 mg/dL Final         Failed - CBC within normal limits and completed in the last 12 months    WBC  Date Value Ref Range Status  05/14/2024 10.2 4.0 - 10.5 K/uL Final   RBC  Date Value Ref Range Status  05/14/2024 4.31 4.22 - 5.81 MIL/uL Final   Hemoglobin  Date Value Ref Range Status  05/14/2024 12.3 (L) 13.0 - 17.0 g/dL Final  89/77/7974 85.5 13.0 - 17.7 g/dL Final   HCT  Date Value Ref Range Status  05/14/2024 39.8 39.0 - 52.0 % Final   Hematocrit  Date Value Ref Range Status  04/23/2024 45.0 37.5 - 51.0 % Final   MCHC  Date Value Ref Range Status  05/14/2024 30.9 30.0 - 36.0 g/dL Final   [facility]  Date Value Ref Range Status  05/14/2024 28.5 26.0 - 34.0 pg Final   MCV  Date Value Ref Range Status  05/14/2024 92.3 80.0 - 100.0 fL Final  04/23/2024 91 79 - 97 fL Final  05/25/2014 88 80 - 100 fL Final    No results found for: PLTCOUNTKUC, LABPLAT, POCPLA RDW  Date Value Ref Range Status  05/14/2024 18.2 (H) 11.5 - 15.5 % Final  04/23/2024 15.0 11.6 - 15.4 % Final  05/25/2014 13.9 11.5 - 14.5 % Final         Passed - Uric Acid in normal range and within 360 days    Uric Acid  Date Value Ref Range Status  03/17/2024 5.6 3.8 - 8.4 mg/dL Final    Comment:               Therapeutic target for gout patients: <6.0         Passed - Valid encounter within last 12 months    Recent Outpatient Visits           3 weeks ago Hypotension due to hypovolemia   [facility] [place], [name], DO   1 month ago Cellulitis of right upper extremity   [place] [facility], Leita DEL, MD   1 month ago Supratherapeutic INR   [name] [place], [place], DO   1 month ago Supratherapeutic INR   [facility] [place], [place], DO   2 months ago Supratherapeutic INR    [facility] [place], [place], DO       Future Appointments             In 2 weeks End, [name], MD  [facility]             pantoprazole  (PROTONIX ) 40 MG tablet 60 tablet     Sig: Take 1 tablet (40 mg total) by mouth 2 (two) times daily.     Gastroenterology: Proton Pump Inhibitors Passed - 05/16/2024  4:28 PM      Passed - Valid encounter within last 12 months    Recent Outpatient Visits           3 weeks ago Hypotension due to hypovolemia  [facility] [place], [place] P, DO   1 month ago Cellulitis of right upper extremity   [place] [facility], Leita DEL, MD   1 month ago Supratherapeutic INR   [place] [facility] [place], [place], DO   1 month ago Supratherapeutic INR    [facility] [place], [place], DO   2 months ago  Supratherapeutic INR    [facility] [place], [place] S[place], DO       Future Appointments             In 2 weeks End, [name], MD [facility]

## 2024-05-18 ENCOUNTER — Emergency Department (HOSPITAL_COMMUNITY)

## 2024-05-18 ENCOUNTER — Other Ambulatory Visit: Payer: Self-pay

## 2024-05-18 ENCOUNTER — Inpatient Hospital Stay (HOSPITAL_COMMUNITY)
Admission: EM | Admit: 2024-05-18 | Discharge: 2024-06-02 | DRG: 208 | Disposition: E | Attending: Emergency Medicine | Admitting: Emergency Medicine

## 2024-05-18 ENCOUNTER — Encounter (HOSPITAL_COMMUNITY): Payer: Self-pay

## 2024-05-18 ENCOUNTER — Other Ambulatory Visit: Payer: Self-pay | Admitting: Family Medicine

## 2024-05-18 DIAGNOSIS — K7200 Acute and subacute hepatic failure without coma: Secondary | ICD-10-CM | POA: Diagnosis present

## 2024-05-18 DIAGNOSIS — Z66 Do not resuscitate: Secondary | ICD-10-CM | POA: Diagnosis present

## 2024-05-18 DIAGNOSIS — H9190 Unspecified hearing loss, unspecified ear: Secondary | ICD-10-CM | POA: Diagnosis present

## 2024-05-18 DIAGNOSIS — I469 Cardiac arrest, cause unspecified: Secondary | ICD-10-CM | POA: Diagnosis present

## 2024-05-18 DIAGNOSIS — R570 Cardiogenic shock: Secondary | ICD-10-CM

## 2024-05-18 DIAGNOSIS — Z604 Social exclusion and rejection: Secondary | ICD-10-CM | POA: Diagnosis present

## 2024-05-18 DIAGNOSIS — I712 Thoracic aortic aneurysm, without rupture, unspecified: Secondary | ICD-10-CM | POA: Diagnosis present

## 2024-05-18 DIAGNOSIS — E872 Acidosis, unspecified: Secondary | ICD-10-CM | POA: Diagnosis present

## 2024-05-18 DIAGNOSIS — Z515 Encounter for palliative care: Secondary | ICD-10-CM | POA: Diagnosis not present

## 2024-05-18 DIAGNOSIS — I4819 Other persistent atrial fibrillation: Secondary | ICD-10-CM

## 2024-05-18 DIAGNOSIS — Z5982 Transportation insecurity: Secondary | ICD-10-CM | POA: Diagnosis not present

## 2024-05-18 DIAGNOSIS — E7800 Pure hypercholesterolemia, unspecified: Secondary | ICD-10-CM | POA: Diagnosis present

## 2024-05-18 DIAGNOSIS — E8729 Other acidosis: Secondary | ICD-10-CM

## 2024-05-18 DIAGNOSIS — I130 Hypertensive heart and chronic kidney disease with heart failure and stage 1 through stage 4 chronic kidney disease, or unspecified chronic kidney disease: Secondary | ICD-10-CM | POA: Diagnosis present

## 2024-05-18 DIAGNOSIS — G928 Other toxic encephalopathy: Secondary | ICD-10-CM | POA: Diagnosis present

## 2024-05-18 DIAGNOSIS — I5082 Biventricular heart failure: Secondary | ICD-10-CM | POA: Diagnosis present

## 2024-05-18 DIAGNOSIS — Z952 Presence of prosthetic heart valve: Secondary | ICD-10-CM | POA: Diagnosis not present

## 2024-05-18 DIAGNOSIS — I2510 Atherosclerotic heart disease of native coronary artery without angina pectoris: Secondary | ICD-10-CM | POA: Diagnosis present

## 2024-05-18 DIAGNOSIS — Z5941 Food insecurity: Secondary | ICD-10-CM

## 2024-05-18 DIAGNOSIS — Z59868 Other specified financial insecurity: Secondary | ICD-10-CM

## 2024-05-18 DIAGNOSIS — I428 Other cardiomyopathies: Secondary | ICD-10-CM | POA: Diagnosis present

## 2024-05-18 DIAGNOSIS — J449 Chronic obstructive pulmonary disease, unspecified: Secondary | ICD-10-CM | POA: Diagnosis present

## 2024-05-18 DIAGNOSIS — Z91040 Latex allergy status: Secondary | ICD-10-CM

## 2024-05-18 DIAGNOSIS — N1831 Chronic kidney disease, stage 3a: Secondary | ICD-10-CM

## 2024-05-18 DIAGNOSIS — E669 Obesity, unspecified: Secondary | ICD-10-CM | POA: Diagnosis present

## 2024-05-18 DIAGNOSIS — F17210 Nicotine dependence, cigarettes, uncomplicated: Secondary | ICD-10-CM | POA: Diagnosis present

## 2024-05-18 DIAGNOSIS — J9600 Acute respiratory failure, unspecified whether with hypoxia or hypercapnia: Principal | ICD-10-CM | POA: Diagnosis present

## 2024-05-18 DIAGNOSIS — W19XXXA Unspecified fall, initial encounter: Secondary | ICD-10-CM | POA: Diagnosis present

## 2024-05-18 DIAGNOSIS — D72829 Elevated white blood cell count, unspecified: Secondary | ICD-10-CM | POA: Diagnosis not present

## 2024-05-18 DIAGNOSIS — Z6831 Body mass index (BMI) 31.0-31.9, adult: Secondary | ICD-10-CM

## 2024-05-18 DIAGNOSIS — Z8673 Personal history of transient ischemic attack (TIA), and cerebral infarction without residual deficits: Secondary | ICD-10-CM

## 2024-05-18 LAB — I-STAT VENOUS BLOOD GAS, ED
Acid-base deficit: 5 mmol/L — ABNORMAL HIGH (ref 0.0–2.0)
Bicarbonate: 23.6 mmol/L (ref 20.0–28.0)
Calcium, Ion: 1.12 mmol/L — ABNORMAL LOW (ref 1.15–1.40)
HCT: 42 % (ref 39.0–52.0)
Hemoglobin: 14.3 g/dL (ref 13.0–17.0)
O2 Saturation: 24 %
Potassium: 5.5 mmol/L — ABNORMAL HIGH (ref 3.5–5.1)
Sodium: 145 mmol/L (ref 135–145)
TCO2: 25 mmol/L (ref 22–32)
pCO2, Ven: 55.7 mmHg (ref 44–60)
pH, Ven: 7.236 — ABNORMAL LOW (ref 7.25–7.43)
pO2, Ven: 20 mmHg — CL (ref 32–45)

## 2024-05-18 LAB — CBC WITH DIFFERENTIAL/PLATELET
Abs Immature Granulocytes: 0.29 K/uL — ABNORMAL HIGH (ref 0.00–0.07)
Basophils Absolute: 0.1 K/uL (ref 0.0–0.1)
Basophils Relative: 1 %
Eosinophils Absolute: 0.1 K/uL (ref 0.0–0.5)
Eosinophils Relative: 0 %
HCT: 41.5 % (ref 39.0–52.0)
Hemoglobin: 12.6 g/dL — ABNORMAL LOW (ref 13.0–17.0)
Immature Granulocytes: 2 %
Lymphocytes Relative: 5 %
Lymphs Abs: 0.8 K/uL (ref 0.7–4.0)
MCH: 28.7 pg (ref 26.0–34.0)
MCHC: 30.4 g/dL (ref 30.0–36.0)
MCV: 94.5 fL (ref 80.0–100.0)
Monocytes Absolute: 0.9 K/uL (ref 0.1–1.0)
Monocytes Relative: 6 %
Neutro Abs: 14 K/uL — ABNORMAL HIGH (ref 1.7–7.7)
Neutrophils Relative %: 86 %
Platelets: 365 K/uL (ref 150–400)
RBC: 4.39 MIL/uL (ref 4.22–5.81)
RDW: 17.9 % — ABNORMAL HIGH (ref 11.5–15.5)
WBC: 16.1 K/uL — ABNORMAL HIGH (ref 4.0–10.5)
nRBC: 0 % (ref 0.0–0.2)

## 2024-05-18 LAB — CBG MONITORING, ED
Glucose-Capillary: 97 mg/dL (ref 70–99)
Glucose-Capillary: 149 mg/dL — ABNORMAL HIGH (ref 70–99)

## 2024-05-18 LAB — COMPREHENSIVE METABOLIC PANEL WITH GFR
ALT: 154 U/L — ABNORMAL HIGH (ref 0–44)
AST: 477 U/L — ABNORMAL HIGH (ref 15–41)
Albumin: 2.3 g/dL — ABNORMAL LOW (ref 3.5–5.0)
Alkaline Phosphatase: 239 U/L — ABNORMAL HIGH (ref 38–126)
Anion gap: 14 (ref 5–15)
BUN: 21 mg/dL (ref 8–23)
CO2: 19 mmol/L — ABNORMAL LOW (ref 22–32)
Calcium: 8.6 mg/dL — ABNORMAL LOW (ref 8.9–10.3)
Chloride: 106 mmol/L (ref 98–111)
Creatinine, Ser: 1.76 mg/dL — ABNORMAL HIGH (ref 0.61–1.24)
GFR, Estimated: 41 mL/min — ABNORMAL LOW (ref >60)
Glucose, Bld: 128 mg/dL — ABNORMAL HIGH (ref 70–99)
Potassium: 4.5 mmol/L (ref 3.5–5.1)
Sodium: 139 mmol/L (ref 135–145)
Total Bilirubin: 1.8 mg/dL — ABNORMAL HIGH (ref 0.0–1.2)
Total Protein: 6 g/dL — ABNORMAL LOW (ref 6.5–8.1)

## 2024-05-18 LAB — RESP PANEL BY RT-PCR (RSV, FLU A&B, COVID)  RVPGX2
Influenza A by PCR: NEGATIVE
Influenza B by PCR: NEGATIVE
Resp Syncytial Virus by PCR: NEGATIVE
SARS Coronavirus 2 by RT PCR: NEGATIVE

## 2024-05-18 LAB — ETHANOL: Alcohol, Ethyl (B): <15 mg/dL (ref <15)

## 2024-05-18 LAB — I-STAT CG4 LACTIC ACID, ED: Lactic Acid, Venous: 3.7 mmol/L (ref 0.5–1.9)

## 2024-05-18 LAB — MAGNESIUM: Magnesium: 1.7 mg/dL (ref 1.7–2.4)

## 2024-05-18 LAB — ABO/RH: ABO/RH(D): B POS

## 2024-05-18 LAB — TROPONIN I (HIGH SENSITIVITY): Troponin I (High Sensitivity): 97 ng/L — ABNORMAL HIGH (ref <18)

## 2024-05-18 MED ORDER — REVEFENACIN 175 MCG/3ML IN SOLN: 175.0000 ug | Freq: Every day | RESPIRATORY_TRACT | Status: DC

## 2024-05-18 MED ORDER — VASOPRESSIN 20 UNITS/100 ML INFUSION FOR SHOCK
0.0000 [IU]/min | INTRAVENOUS | Status: DC
  Administered 2024-05-18: 0.03 [IU]/min via INTRAVENOUS
  Filled 2024-05-18: qty 100

## 2024-05-18 MED ORDER — POLYETHYLENE GLYCOL 3350 17 G PO PACK: 17.0000 g | PACK | Freq: Every day | ORAL | Status: DC | PRN

## 2024-05-18 MED ORDER — EPINEPHRINE 1 MG/10ML IV SOSY
PREFILLED_SYRINGE | INTRAVENOUS | Status: AC | PRN
  Administered 2024-05-18 (×2): 1 mg via INTRAVENOUS

## 2024-05-18 MED ORDER — ARFORMOTEROL TARTRATE 15 MCG/2ML IN NEBU
15.0000 ug | INHALATION_SOLUTION | Freq: Two times a day (BID) | RESPIRATORY_TRACT | Status: DC
Start: 2024-05-18 — End: 2024-05-18

## 2024-05-18 MED ORDER — SODIUM CHLORIDE 0.9 % IV SOLN: 2.0000 g | Freq: Two times a day (BID) | INTRAVENOUS | Status: DC

## 2024-05-18 MED ORDER — ACETAMINOPHEN 650 MG RE SUPP
650.0000 mg | RECTAL | Status: DC | PRN
Start: 2024-05-18 — End: 2024-05-18
  Filled 2024-05-18: qty 1

## 2024-05-18 MED ORDER — CALCIUM CHLORIDE 10 % IV SOLN
INTRAVENOUS | Status: DC | PRN
  Administered 2024-05-18: 1 g via INTRAVENOUS

## 2024-05-18 MED ORDER — EPINEPHRINE 1 MG/10ML IV SOSY
PREFILLED_SYRINGE | INTRAVENOUS | Status: DC | PRN
Start: 2024-05-18 — End: 2024-05-18
  Administered 2024-05-18 (×2): 1 mg via INTRAVENOUS

## 2024-05-18 MED ORDER — CHLORHEXIDINE GLUCONATE CLOTH 2 % EX PADS: 6.0000 | MEDICATED_PAD | Freq: Every day | CUTANEOUS | Status: DC

## 2024-05-18 MED ORDER — BUDESONIDE 0.5 MG/2ML IN SUSP: 0.5000 mg | Freq: Two times a day (BID) | RESPIRATORY_TRACT | Status: DC

## 2024-05-18 MED ORDER — IPRATROPIUM-ALBUTEROL 0.5-2.5 (3) MG/3ML IN SOLN: 3.0000 mL | RESPIRATORY_TRACT | Status: DC | PRN

## 2024-05-18 MED ORDER — EPINEPHRINE HCL 5 MG/250ML IV SOLN IN NS: 0.5000 ug/min | INTRAVENOUS | Status: DC

## 2024-05-18 MED ORDER — SODIUM BICARBONATE 8.4 % IV SOLN
50.0000 meq | Freq: Once | INTRAVENOUS | Status: AC
  Administered 2024-05-18: 50 meq via INTRAVENOUS

## 2024-05-18 MED ORDER — EPINEPHRINE 1 MG/10ML IV SOSY
PREFILLED_SYRINGE | INTRAVENOUS | Status: AC
  Filled 2024-05-18: qty 30

## 2024-05-18 MED ORDER — SODIUM BICARBONATE 8.4 % IV SOLN
INTRAVENOUS | Status: DC | PRN
Start: 2024-05-18 — End: 2024-05-18
  Administered 2024-05-18: 50 meq via INTRAVENOUS

## 2024-05-18 MED ORDER — EPINEPHRINE HCL 5 MG/250ML IV SOLN IN NS
INTRAVENOUS | Status: AC
  Administered 2024-05-18: 20 ug/min via INTRAVENOUS
  Filled 2024-05-18: qty 250

## 2024-05-18 MED ORDER — HYDROCORTISONE SOD SUC (PF) 100 MG IJ SOLR: 100.0000 mg | Freq: Three times a day (TID) | INTRAMUSCULAR | Status: DC

## 2024-05-18 MED ORDER — SODIUM CHLORIDE 0.9 % IV SOLN: 100.0000 mg | Freq: Two times a day (BID) | INTRAVENOUS | Status: DC

## 2024-05-18 MED ORDER — FAMOTIDINE 20 MG PO TABS: 20.0000 mg | ORAL_TABLET | Freq: Two times a day (BID) | ORAL | Status: DC

## 2024-05-18 MED ORDER — NOREPINEPHRINE 4 MG/250ML-% IV SOLN
0.0000 ug/min | INTRAVENOUS | Status: DC
  Administered 2024-05-18: 15 ug/min via INTRAVENOUS

## 2024-05-18 MED ORDER — MAGNESIUM SULFATE 2 GM/50ML IV SOLN
2.0000 g | Freq: Once | INTRAVENOUS | Status: AC
  Administered 2024-05-18: 2 g via INTRAVENOUS
  Filled 2024-05-18: qty 50

## 2024-05-18 MED ORDER — AMIODARONE HCL 150 MG/3ML IV SOLN
INTRAVENOUS | Status: DC | PRN
Start: 2024-05-18 — End: 2024-05-18
  Administered 2024-05-18: 300 mg via INTRAVENOUS

## 2024-05-18 MED ORDER — DOCUSATE SODIUM 100 MG PO CAPS: 100.0000 mg | ORAL_CAPSULE | Freq: Two times a day (BID) | ORAL | Status: DC | PRN

## 2024-05-18 MED ORDER — SODIUM CHLORIDE 0.9 % IV SOLN
2.0000 g | Freq: Once | INTRAVENOUS | Status: AC
  Administered 2024-05-18: 2 g via INTRAVENOUS
  Filled 2024-05-18: qty 12.5

## 2024-05-18 MED ORDER — LACTATED RINGERS IV BOLUS
500.0000 mL | Freq: Once | INTRAVENOUS | Status: AC
  Administered 2024-05-18: 500 mL via INTRAVENOUS

## 2024-05-18 MED ORDER — VANCOMYCIN HCL 2000 MG/400ML IV SOLN
2000.0000 mg | Freq: Once | INTRAVENOUS | Status: AC
  Administered 2024-05-18: 2000 mg via INTRAVENOUS
  Filled 2024-05-18: qty 400

## 2024-05-18 MED ORDER — EPINEPHRINE 0.1 MG/10ML (10 MCG/ML) SYRINGE FOR IV PUSH (FOR BLOOD PRESSURE SUPPORT): 5.0000 ug | PREFILLED_SYRINGE | Freq: Once | INTRAVENOUS | Status: DC | PRN

## 2024-05-18 MED ORDER — VANCOMYCIN HCL IN DEXTROSE 1-5 GM/200ML-% IV SOLN: 1000.0000 mg | INTRAVENOUS | Status: DC

## 2024-05-19 LAB — RESPIRATORY PANEL BY PCR

## 2024-05-20 ENCOUNTER — Inpatient Hospital Stay: Admitting: Nurse Practitioner

## 2024-05-20 ENCOUNTER — Inpatient Hospital Stay: Admitting: Pediatrics

## 2024-05-20 ENCOUNTER — Ambulatory Visit: Admitting: Cardiology

## 2024-05-23 ENCOUNTER — Ambulatory Visit: Admitting: Family

## 2024-05-23 LAB — CULTURE, BLOOD (ROUTINE X 2)
Culture: NO GROWTH
Culture: NO GROWTH

## 2024-05-26 ENCOUNTER — Ambulatory Visit: Admitting: Physician Assistant

## 2024-05-27 ENCOUNTER — Ambulatory Visit: Admitting: Family

## 2024-05-28 ENCOUNTER — Ambulatory Visit

## 2024-05-30 ENCOUNTER — Telehealth

## 2024-06-02 NOTE — Progress Notes (Signed)
 TOD Called, vent turned off

## 2024-06-02 NOTE — ED Triage Notes (Signed)
 Pt coming from home via ems for evaluation of fall with psychosis episode. Pt was found naked and disoriented. Pt was discharged on the 12th after receiving cardiac stents. EMS reports an INR of 3.17 and a low H&H. 10mg  haldol, 4mg  zofran , 1L NS, 500cc LR given pta; pt was started on coumadin  yesterday BP 84/42 HR 144 SPO2 72% on room air, improved to 94% on 4 lpm Many 20g RAC

## 2024-06-02 NOTE — Progress Notes (Signed)
 Pharmacy Antibiotic Note  Roberto [name] is a 68 y.o. male admitted on 2024-05-21 s/p cardiac arrest, possible sepsis.  Pharmacy has been consulted for Vancomycin  and Cefepime  dosing.  Plan: Vancomycin  1000 mg IV q24h Cefepime 2 g IV q12h  Height: 5' 10 (177.8 cm) Weight: 99 kg (218 lb 4.1 oz) IBW/kg (Calculated) : 73  Temp (24hrs), Avg:101.7 F (38.7 C), Min:101.7 F (38.7 C), Max:101.7 F (38.7 C)  Recent Labs  Lab 05/11/24 0758 05/12/24 0252 05/13/24 0258 05/13/24 0810 05/13/24 1034 05/14/24 0806 May 21, 2024 0507 05-21-24 0513  WBC 6.7 7.9 9.6  --   --  10.2 16.1*  --   CREATININE 1.79* 1.86* 1.67*  --   --  1.69* 1.76*  --   LATICACIDVEN  --   --   --  1.7 1.6  --   --  3.7*    Estimated Creatinine Clearance: 46.7 mL/min (A) (by C-G formula based on SCr of 1.76 mg/dL (H)).    Allergies  Allergen Reactions   Latex Hives   Nicotine  Hives and Rash    Patches caused localized rash and hives     Dail Cordella Misty 05-21-2024 6:58 AM

## 2024-06-02 NOTE — Consult Note (Signed)
 Brief Cards Consult Note  42M recently discharged for inappropriate ICD shock, found to require an atrial lead.  Also h.o biventricular systolic HF with known EF <20%, prior AVR, thoracic aortic aneurysm, prior stroke, CKD stage III  Was doing fine, then had change in mental status today, family called EMS, got haldol and zofran  in the field.  On arrival, he became unresponsive, then developed a wide complex tachycardia, lost pulse. CPR started, ROSC achieved, then PEA arrested again. Fever to 101  On my exam, now intubated, cool extremities. ICD site without overt hematoma or infection.  SBP 50s on epi and NE, vasopressin  being started.  HR was 60 paced.  I got the [employer] programmer and reset his heart rate to pace at 90 in an attempt to boost chronotropy and help his BP.  Device interrogation shows wide complex tachycardia all SVTs.  Unfortunately, setting HR at 90 shows intermittent capture at at 80s, and did not help with his BP.  Quick POCUS showed EF<20% with global severe hypokinesis.  In briefly looking through clinic notes, he does not appear to be a candidate for advanced HF therapy due to comorbidities, including high stage CKD.  Lactate 3.7.  Given this, Impella/balloon pump/ECMO would not be able to bridge him to any viable treatment, so cannot be considered.  Case discussed with Dr. Almetta who will alert EP team. Andee Flatten, MD Cards on call

## 2024-06-02 NOTE — Death Summary Note (Signed)
 Death Summary    Patient details  Name: Roberto [name]  [id number]  1955-08-18  13-Jun-2024  0  Date of death: 06/13/2024 Time of Death 0740   Profile data   adl: obese, debilitated  living situation: single family home Code status  code status: full code DNR status made in ER  Reason for admission   Altered mental status  Preliminary Cause of death   Progressive Non-ischemic cardiomyopathy  Secondary diagnoses, contributing co-morbidities & complicating factors  PEA cardiac arrest SCAI class E  cardiogenic shock s/p cardiac arrest severe biventricular failure with EF less than 20% Lactic acidosis  Leukocytosis Shock liver Comatose  H/o aortic valve replacement on Coumadin  nonobstructive CAD thoracic aortic aneurysm (followed by CT surgery as an outpatient) CVA TIA chronic kidney disease stage IIIa persistent atrial fibrillation Condition on arrival (check all that apply)   Roberto [name] is a 68 y.o. male admitted from single family home  critically ill  and status post cardiac arrest.  critically ill with following findings: .Circulatory shock, Acute respiratory failure, Acute toxic and or metabolic encephalopathy, Severe metabolic derangements, status post cardiac arrest   Hospital course    Roberto [name] is an 68 y.o. male past medical history significant for severe biventricular failure with EF less than 20%, aortic valve replacement on Coumadin , nonobstructive CAD, thoracic aortic aneurysm (followed by CT surgery as an outpatient), history of CVA TIA, chronic kidney disease stage III yea, he also has a history of renal infarct and HFrEF/biventricular failure complicated by VT status post CRT-D and severe right-sided heart failure, persistent atrial fibrillation who was recently admitted 11/3 to 11/7 for syncope. He was found to have AV dyssyncrony and device interogation showed inappropriate ICD shocks. He underwent implant of atrial lead on 05/13/24 by Dr. Waddell.     He was brought in by EMS after a fall and altered mental status noted by family. He was disoriented when found by EMS. He was given 10mg  haldol and 4mg  zofran , 1.5L crystalloid fluids. BP 84/42, HR 144, SpO2 72% on RA - improved to 94% on 4L.    Patient developed cardiac arrest. Various rhythms noted on EKGs. Patient was intubated.   VBG pH 7.23. Serum bicarb 19, Cr 1.79, Alk phos 239, AST/ALT 477/154, T. Bili 1.8, lactic acid 3.7. CBC shows WBC 16.1, Hgb 12.6, plt 365.   CXR shows diffuse interstitial opacities. CT head and C spine are pending.   PCCM called to admit. Cardiology being consulted. At time of PCCM arrival he was s/p ACLS w/ estimated 15 minutes to ROSC but rapidly becoming progressively hypoperfused w/ dropping BP, inability to capture pacer and pulse only obtainable via doppler. HE had been seen by cardiology who felt given his multiple co-morbidities (CKD stage III, prior AVR and obesity as well as what appears to be chronic overall poor health) he was not a candidate for mechanical support as there would be no viable destination of therapy. They Noted POCUS <20% at time of eval.    We called Roberto [name] who was his emergency contact. I spoke to her directly. Explained to her that he was failing all medical therapy, we had nothing more to offer and that CPR in this setting would not be offered. She asked only that we contact a catholic priest. I told her we would not w/d support however did not think he would survive much longer.   Called by nursing staff shortly after leaving bedside. Time of death 59.    Roberto  FORBES Banner, NP   @SIGNATURE @

## 2024-06-02 NOTE — Consult Note (Signed)
 Requesting MD [place] Reason for consult cardiac arrest  HPI Roberto [name] is an 68 y.o. male past medical history significant for severe biventricular failure with EF less than 20%, aortic valve replacement on Coumadin , nonobstructive CAD, thoracic aortic aneurysm (followed by CT surgery as an outpatient), history of CVA TIA, chronic kidney disease stage III yea, he also has a history of renal infarct and HFrEF/biventricular failure complicated by VT status post CRT-D and severe right-sided heart failure, persistent atrial fibrillation who was recently admitted 11/3 to 11/7 for syncope. He was found to have AV dyssyncrony and device interogation showed inappropriate ICD shocks. He underwent implant of atrial lead on 05/13/24 by Dr. Waddell.   He was brought in by EMS after a fall and altered mental status noted by family. He was disoriented when found by EMS. He was given 10mg  haldol and 4mg  zofran , 1.5L crystalloid fluids. BP 84/42, HR 144, SpO2 72% on RA - improved to 94% on 4L.   Patient developed cardiac arrest. Various rhythms noted on EKGs. Patient was intubated.  VBG pH 7.23. Serum bicarb 19, Cr 1.79, Alk phos 239, AST/ALT 477/154, T. Bili 1.8, lactic acid 3.7. CBC shows WBC 16.1, Hgb 12.6, plt 365.  CXR shows diffuse interstitial opacities. CT head and C spine are pending.  PCCM called to admit. Cardiology being consulted. At time of PCCM arrival he was s/p ACLS w/ estimated 15 minutes to ROSC but rapidly becoming progressively hypoperfused w/ dropping BP, inability to capture pacer and pulse only obtainable via doppler. HE had been seen by cardiology who felt given his multiple co-morbidities (CKD stage III, prior AVR and obesity as well as what appears to be chronic overall poor health) he was not a candidate for mechanical support as there would be no viable destination of therapy. They Noted POCUS <20% at time of eval.   We called [name] who was his emergency contact. I spoke to  her directly. Explained to her that he was failing all medical therapy, we had nothing more to offer and that CPR in this setting would not be offered. She asked only that we contact a catholic priest. I told her we would not w/d support however did not think he would survive much longer  Past Medical History:  Diagnosis Date   Aneurysm    Bicuspid aortic valve    a. s/p #27 [employer] mechanical valve on 03/25/2010; b. on Coumadin ; c. TTE 12/17: EF 40-45%, moderately dilated LV with moderate LVH, AVR well-seated with 14 mmHg gradient, peak AV velocity 2.5 m/s, mild mitral valve thickening with mild MR, mildly dilated RV with mildly reduced contraction   Cellulitis    CHF (congestive heart failure) (HCC)    Chronic kidney disease    Chronic systolic CHF (congestive heart failure) (HCC)    a. R/LHC 03/2010 showed no significant CAD, LVEDP 31 mmHg, mean AoV gradient 34 mmHg at rest and 47 mmHg with dobutamine 20 mcg/kg/min, AVA 1.0 cm^2, RA 31, RV 68/25, PA 68/47, PCWP 38. PA sat 65%. CO 6.2 L/min (Fick) and 5.3 L/min (thermodilution)   Clotting disorder    COPD (chronic obstructive pulmonary disease) (HCC)    H/O mechanical aortic valve replacement 03/25/2010   a. 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   Stage 3 chronic kidney disease (HCC)  Stroke [facility])    TIA (transient ischemic attack) 05/2014   Social History   Socioeconomic History   Marital status: Divorced    Spouse name: Not on file   Number of children: 1   Years of education: 14   Highest education level: Associate degree: occupational, scientist, product/process development, or vocational program  Occupational History   Occupation: Disabled    Employer: UNEMPLOYED  Tobacco Use   Smoking status: Every Day    Current packs/day: 0.20    Average packs/day: 0.5 packs/day for 48.4 years (23.8 ttl pk-yrs)     Types: Cigarettes    Start date: 1974    Last attempt to quit: 2021   Smokeless tobacco: Never   Tobacco comments:    Caregiver reports patient currently smokes approximately 2 cigarettes/day.  Vaping Use   Vaping status: Never Used  Substance and Sexual Activity   Alcohol use: Never   Drug use: Never   Sexual activity: Not Currently  Other Topics Concern   Not on file  Social History Narrative   Not on file   Social Drivers of Health   Financial Resource Strain: High Risk (04/21/2024)   Overall Financial Resource Strain (CARDIA)    Difficulty of Paying Living Expenses: Hard  Food Insecurity: Food Insecurity Present (05/10/2024)   Hunger Vital Sign    Worried About Running Out of Food in the Last Year: Never true    Ran Out of Food in the Last Year: Sometimes true  Transportation Needs: No Transportation Needs (05/10/2024)   PRAPARE - Administrator, Civil Service (Medical): No    Lack of Transportation (Non-Medical): No  Recent Concern: Transportation Needs - Unmet Transportation Needs (05/01/2024)   PRAPARE - Administrator, Civil Service (Medical): Yes    Lack of Transportation (Non-Medical): No  Physical Activity: Inactive (04/21/2024)   Exercise Vital Sign    Days of Exercise per Week: 0 days    Minutes of Exercise per Session: Not on file  Stress: Stress Concern Present (04/21/2024)   [employer] of Occupational Health - Occupational Stress Questionnaire    Feeling of Stress: Very much  Social Connections: Socially Isolated (05/10/2024)   Social Connection and Isolation Panel    Frequency of Communication with Friends and Family: Never    Frequency of Social Gatherings with Friends and Family: Never    Attends Religious Services: Never    Database Administrator or Organizations: No    Attends Engineer, Structural: Never    Marital Status: Living with partner   Scheduled Meds:  arformoterol  15 mcg Nebulization BID    budesonide (PULMICORT) nebulizer solution  0.5 mg Nebulization BID   Chlorhexidine  Gluconate Cloth  6 each Topical Daily   EPINEPHrine       famotidine  20 mg Per Tube BID   hydrocortisone sod succinate (SOLU-CORTEF) inj  100 mg Intravenous Q8H   revefenacin  175 mcg Nebulization Daily   Continuous Infusions:  ceFEPime (MAXIPIME) IV     doxycycline  (VIBRAMYCIN ) IV     epinephrine 20 mcg/min (June 12, 2024 0555)   norepinephrine (LEVOPHED) Adult infusion 50 mcg/min (12-Jun-2024 9361)   [START ON 05/19/2024] vancomycin      vancomycin  2,000 mg (06-12-2024 0700)   vasopressin  0.03 Units/min (2024-06-12 0634)   PRN Meds:.acetaminophen , amiodarone , calcium  chloride, docusate sodium , EPINEPHrine, EPINEPHrine, ipratropium-albuterol , polyethylene glycol, sodium bicarbonate  Allergies  Allergen Reactions   Latex Hives   Nicotine  Hives and Rash    Patches caused localized  rash and hives     BP (!) 64/38   Pulse (!) 111   Temp (!) 101.7 F (38.7 C) (Rectal)   Resp 13   Ht 5' 10 (1.778 m)   Wt 99 kg   SpO2 (!) 87%   BMI 31.32 kg/m     Exam Unresponsive cyanotic actively dying white male on maximum support.  HENT pupils dilated. MM dry orally intubated Pulm dec'd both bases. Currently on full vent support  Card RR 40s no capture on pacer Abd obese Ext cyanotic Gu anuric Neuro GCS 3 unresponsive to pain or stimulus  Impression/plan PEA cardiac arrest SCAI class E  cardiogenic shock s/p cardiac arrest severe biventricular failure with EF less than 20% Lactic acidosis  Leukocytosis Shock liver Comatose  H/o aortic valve replacement on Coumadin  nonobstructive CAD thoracic aortic aneurysm (followed by CT surgery as an outpatient) CVA TIA chronic kidney disease stage IIIa persistent atrial fibrillation  Discussion Refractory stage E Cardiogenic shock s/p PEA cardiac arrest. Suspect primarily 2/2 progression of his NICM, not a candidate for mechanical support as not a candidate for  destination therapy. Discussed w/ emergency contact. Recommended DNR and comfort care. Informed them that he is no longer candidate for CPR or ACLS interventions.   Plan Getting priest at family request Cont current gtts for now DNR Nurse may pronounce death    I personally  spent 35 minutes  on this patient which included: review of medical records, nursing notes, progress notes, evaluation, interpretation of lab data and diagnostic studies, taking independent history, performing exam, documenting plan, ordering diagnostics and interventions for the following critical care issues:  with the following interventions which included: EOL discussion

## 2024-06-02 NOTE — Code Documentation (Signed)
 RT at bedside to assist with intubation, pt intubated with glidescope and a 7.5 ETT 22 lip Good color change, BBS noted

## 2024-06-02 NOTE — Sepsis Progress Note (Signed)
 Elink following code sepsis

## 2024-06-02 NOTE — Progress Notes (Signed)
   06-08-24 0916  Spiritual Encounters  Type of Visit Initial;Follow up  Care provided to: [facility] partners present during encounter Nurse  Referral source Clinical staff  Reason for visit Patient death  OnCall Visit Yes   Chaplain responded to patient's death. Chaplain met with patient's wife, [name] Mourer Rick Custard). Chaplain extended hospitality. Chaplain offered prayer and support.   [name], contact information already in the chart, is unsure about what funeral home she wants to use. Chaplain gave her information for patient placement. She wants him to be buried in [place].   Spiritual care services available as needed.    Juliene CHRISTELLA Das, Chaplain 06-08-2024

## 2024-06-02 NOTE — Sepsis Progress Note (Signed)
Code Sepsis monitoring discontinued due to expiration of patient.  

## 2024-06-02 NOTE — ED Provider Notes (Addendum)
 [place] EMERGENCY DEPARTMENT AT [facility] Provider Note   CSN: [id number] Arrival date & time: 06/08/2024  0502     History No chief complaint on file.   HPI Roberto [name] is an 68 y.o. male past medical history significant for severe biventricular failure with EF less than 20%, aortic valve replacement on Coumadin , nonobstructive CAD, thoracic aortic aneurysm (followed by CT surgery as an outpatient), history of CVA TIA, chronic kidney disease stage III yea, he also has a history of renal infarct and HFrEF/biventricular failure complicated by VT status post CRT-D and severe right-sided heart failure, persistent atrial fibrillation who was recently admitted for syncopal evaluation.  During that admission. Device interrogation showed inappropriate ICD shocks.  EKG shows AV dyssynchrony.  He underwent implant of atrial lead on 05/13/24 per Dr. Waddell.  Treated with Lovenox  perioperatively.  INR is 3.1 on the day of discharge Today, he is presenting for substantial altered mental status. Family called EMS because patient was being nonsensical, taking all of his clothes off and thrashing about on the floor.  He had a fall event. EMS arrived and patient was unable to be reoriented.  He was administered 10 mg of IM Haldol with development of mild somnolence but improvement in his agitation.  He was able to be transferred emergently to the department at that time. Patient unable to answer questions reliably has no acute complaints.  Patient's recorded medical, surgical, social, medication list and allergies were reviewed in the Snapshot window as part of the initial history.   Review of Systems   Review of Systems  Unable to perform ROS: Mental status change    Physical Exam Updated Vital Signs There were no vitals taken for this visit. Physical Exam Vitals and nursing note reviewed.  Constitutional:      General: He is not in acute distress.    Appearance: He is  well-developed. He is ill-appearing.  HENT:     Head: Normocephalic and atraumatic.  Eyes:     Conjunctiva/sclera: Conjunctivae normal.  Cardiovascular:     Rate and Rhythm: Regular rhythm. Tachycardia present.     Heart sounds: No murmur heard. Pulmonary:     Effort: Pulmonary effort is normal. No respiratory distress.     Breath sounds: Normal breath sounds.  Abdominal:     Palpations: Abdomen is soft.     Tenderness: There is no abdominal tenderness.  Musculoskeletal:        General: No swelling.     Cervical back: Neck supple.  Skin:    General: Skin is warm and dry.     Capillary Refill: Capillary refill takes less than 2 seconds.  Neurological:     Mental Status: He is alert. He is disoriented.     Sensory: No sensory deficit.     Motor: No weakness.  Psychiatric:        Mood and Affect: Mood normal.      ED Course/ Medical Decision Making/ A&P    Procedures .Critical Care  Performed by: Jerral Meth, MD Authorized by: Jerral Meth, MD   Critical care provider statement:    Critical care time (minutes):  90   Critical care was necessary to treat or prevent imminent or life-threatening deterioration of the following conditions:  Circulatory failure, respiratory failure, shock and sepsis   Critical care was time spent personally by me on the following activities:  Development of treatment plan with patient or surrogate, discussions with consultants, evaluation of patient's response to treatment,  examination of patient, ordering and review of laboratory studies, ordering and review of radiographic studies, ordering and performing treatments and interventions, pulse oximetry, re-evaluation of patient's condition and review of old charts   Care discussed with: admitting provider   Procedure Name: Intubation Date/Time: 07-Jun-2024 6:54 AM  Performed by: Jerral Meth, MDPre-anesthesia Checklist: Patient identified, Patient being monitored, Emergency Drugs  available, Timeout performed and Suction available Oxygen Delivery Method: Non-rebreather mask Preoxygenation: Pre-oxygenation with 100% oxygen Induction Type: Rapid sequence Ventilation: Mask ventilation without difficulty Laryngoscope Size: Mac and 4 Grade View: Grade I Tube size: 7.5 mm Number of attempts: 1 Airway Equipment and Method: Video-laryngoscopy Placement Confirmation: ETT inserted through vocal cords under direct vision, CO2 detector and Breath sounds checked- equal and bilateral Secured at: 22 cm Tube secured with: ETT holder    CPR  Date/Time: 06-07-24 6:55 AM  Performed by: Jerral Meth, MD Authorized by: Jerral Meth, MD  CPR Procedure Details:      Amount of time prior to administration of ACLS/BLS (minutes):  2   ACLS/BLS initiated by EMS: Yes     CPR/ACLS performed in the ED: Yes     Duration of CPR (minutes):  15   Outcome: ROSC obtained    CPR performed via ACLS guidelines under my direct supervision.  See RN documentation for details including defibrillator use, medications, doses and timing.    Medications Ordered in ED Medications - No data to display Medical Decision Making:   Roberto [name] is a 68 y.o. male who presented to the ED today with multiple symptoms detailed above.    Patient placed on continuous vitals and telemetry monitoring while in ED which was reviewed periodically.  Complete initial physical exam performed, notably the patient  was ill-appearing. During this initial exam, patient met criteria for activation of code sepsis due to presence of the following SIRS criteria as well as suspected infectious etiology: tachypnea, tachycardia, fever, triage CBC with leukocytosis. Reviewed and confirmed nursing documentation for past medical history, family history, social history.    Initial Assessment:   With the patient's presentation of signs and symptoms of sepsis, most likely diagnosis is bacteremia secondary to underlying  infection.  Considerations for source were initiated including:Urinary tract infections, abdominal infections such as cholecystitis/cholangitis/appendicitis, pulmonary etiology, bacteremia, skin etiology such as cellulitis or fasciitis, neurologic etiology such as meningitis or encephalitis.  This is most consistent with an acute life/limb threatening illness complicated by underlying chronic conditions.  Initial Plan:  Activated hospital protocol code sepsis including blood cultures, lactic acid screening, and further diagnostic care and management. Therapeutically, resuscitation fluids were considered. Patient has a history of heart failure and clinical evidence of volume overload therefore 30 cc/kg is not indicated. Therapeutically, antibiotics were administered on a broad-spectrum nature. Undifferentiated source: Vancomycin  and cefepime were administered to cover gram-positive and gram-negative high risk infections Screening labs including CBC and Metabolic panel to evaluate for infectious or metabolic etiology of disease.  Urinalysis with reflex culture ordered to evaluate for UTI or relevant urologic/nephrologic pathology.  CXR to evaluate for structural/infectious intrathoracic pathology.  EKG and serial troponin to evaluate for cardiac pathology. Objective evaluation as below reviewed    Reassessment and Plan:   Prior to any results.  I was called back to patient bedside.  He had become unresponsive.  Telemetry had changed from sinus tachycardia to a ventricular paced tachycardia.  Patient's blood pressure spontaneously decreased.  Patient's mental status substantially changed.  Patient soon became pulseless and apneic.  CPR  was started.  Patient was resuscitated according to ACLS algorithm.  BVM initiated by RT, epinephrine administered, 2 minutes of CPR with ROSC. Patient was placed on the pads, chest x-ray was ordered and while being set up for intubation, patient again spontaneously  arrested.  He went into PEA.  Amiodarone , magnesium , calcium  gluconate, sodium bicarb and IV fluids were all administered with 10 minutes of total CPR administered prior to ROSC At this point, patient was intubated as above.  Studies begin resulting showing substantial systemic illness and multiorgan failure.  Thought to be a primary cardiac arrest given his history.  Cardiology and critical care were both consulted.  Patient was broadly treated with antibiotics during this but ultimately he required escalating doses of vaso supportive medications.  Maps remained in the 30s to 50s. Critical care to come evaluate patient at bedside.  Substantial time was spent trying to reach out to patient contacts.  No answer on primary phone line.  Checked all adjacent medical records without any answer.  Disposition:   Based on the above findings, I believe this patient is stable for admission.    Patient/family educated about specific findings on our evaluation and explained exact reasons for admission.  Patient/family educated about clinical situation and time was allowed to answer questions.   Admission team communicated with and agreed with need for admission. Patient admitted. Patient ready to move at this time.     Emergency Department Medication Summary:   Medications  ceFEPIme (MAXIPIME) 2 g in sodium chloride  0.9 % 100 mL IVPB (2 g Intravenous New Bag/Given 06/08/24 0626)  vancomycin  (VANCOREADY) IVPB 2000 mg/400 mL (has no administration in time range)  acetaminophen  (TYLENOL ) suppository 650 mg (has no administration in time range)  EPINEPHrine (ADRENALIN) 1 MG/10ML injection (1 mg Intravenous Given 08-Jun-2024 0556)  amiodarone  (CORDARONE ) injection (300 mg Intravenous Given 06-08-2024 0553)  sodium bicarbonate injection (50 mEq Intravenous Given 06/08/24 0555)  calcium  chloride injection (1 g Intravenous Given 06-08-2024 0557)  norepinephrine (LEVOPHED) 4mg  in (0.016 mg/mL) premix infusion (50  mcg/min Intravenous Rate/Dose Change 06/08/2024 0638)  vasopressin  (PITRESSIN) 20 Units in 100 mL (0.2 unit/mL) infusion-*FOR SHOCK* (0.03 Units/min Intravenous New Bag/Given 06/08/2024 0634)  EPINEPHrine (ADRENALIN) 5 mg in NS 250 mL (0.02 mg/mL) premix infusion (20 mcg/min Intravenous New Bag/Given 06-08-2024 0555)  Chlorhexidine  Gluconate Cloth 2 % PADS 6 each (has no administration in time range)  docusate sodium  (COLACE) capsule 100 mg (has no administration in time range)  polyethylene glycol (MIRALAX  / GLYCOLAX ) packet 17 g (has no administration in time range)  famotidine (PEPCID) tablet 20 mg (has no administration in time range)  lactated ringers  bolus 500 mL (500 mLs Intravenous New Bag/Given 2024/06/08 0546)  EPINEPHrine (ADRENALIN) 1 MG/10ML injection (1 mg Intravenous Given Jun 08, 2024 0548)  magnesium  sulfate IVPB 2 g 50 mL (0 g Intravenous Stopped 06/08/2024 0638)  sodium bicarbonate injection 50 mEq (50 mEq Intravenous Given 06-08-2024 0645)       Clinical Impression: No diagnosis found.   Data Unavailable   Final Clinical Impression(s) / ED Diagnoses Final diagnoses:  None    Rx / DC Orders ED Discharge Orders     None         Jerral Meth, MD 08-Jun-2024 [phone]   I was called to bedside yet again.  Patient had a another cardiac arrest bringing his total downtime to 15 minutes. Critical care team came by during this arrest. We are unable to get a hold of any family.  Called all numbers in chart. Ultimately, patient is completely unresponsive to medical therapies.  He is not a candidate for bypass per critical care.  Joint multidisciplinary team is in agreement that further cardiopulmonary resuscitation is unlikely to be successful and that if patient was to become pulseless again, there would be no indication to continue resuscitation.     Jerral Meth, MD May 31, 2024 [phone]

## 2024-06-02 NOTE — Progress Notes (Signed)
   06-06-2024 0700  Spiritual Encounters  Type of Visit Initial  Referral source Nurse (RN/NT/LPN)  OnCall Visit Yes    Chaplain was paged by RN. The nurse stated that the patient's family request a Cathern over the phone. No family present at this time. Chaplain called Father Marinell and [facility] [employer] and left a voicemail. Once they respond, chaplain will provide an update.    M.Kubra Susanna Kerry Resident [phone]

## 2024-06-02 NOTE — Progress Notes (Signed)
 ED Pharmacy Antibiotic Sign Off An antibiotic consult was received from an ED provider for Vancomycin  per pharmacy dosing for sepsis. A chart review was completed to assess appropriateness.   The following one time order(s) were placed:  Vancomycin  2000 mg IV  Further antibiotic and/or antibiotic pharmacy consults should be ordered by the admitting provider if indicated.   Dail Cordella Misty, [facility]  Clinical Pharmacist June 15, 2024 5:31 AM

## 2024-06-02 NOTE — ED Notes (Signed)
 [id number] no pulse detected CPR started  , Dr. Jerral at bedside epip x 1 given .  CCMD at bedside . 0714 pulse check very faint pulse with doppler , per Dr. Jerral 0715 [name] states she is his loved one  and she is aware of patient status

## 2024-06-02 DEATH — deceased

## 2024-06-04 ENCOUNTER — Ambulatory Visit: Admitting: Internal Medicine

## 2024-06-12 ENCOUNTER — Ambulatory Visit: Admitting: Pulmonary Disease

## 2024-06-19 ENCOUNTER — Ambulatory Visit: Admitting: Pediatrics

## 2024-06-24 ENCOUNTER — Ambulatory Visit: Payer: 59

## 2024-07-24 NOTE — Telephone Encounter (Signed)
 Closing encounter

## 2024-08-20 ENCOUNTER — Ambulatory Visit: Admitting: Cardiology

## 2024-09-09 ENCOUNTER — Ambulatory Visit

## 2024-09-23 ENCOUNTER — Ambulatory Visit: Payer: 59
